# Patient Record
Sex: Male | Born: 1970
Health system: Southern US, Community
[De-identification: ages and names within clinical notes are randomized; demographics above are authoritative.]

## PROBLEM LIST (undated history)

## (undated) DIAGNOSIS — F32A Depression, unspecified: Secondary | ICD-10-CM

## (undated) DIAGNOSIS — G4733 Obstructive sleep apnea (adult) (pediatric): Secondary | ICD-10-CM

## (undated) DIAGNOSIS — F319 Bipolar disorder, unspecified: Secondary | ICD-10-CM

## (undated) DIAGNOSIS — I241 Dressler's syndrome: Secondary | ICD-10-CM

## (undated) DIAGNOSIS — J189 Pneumonia, unspecified organism: Secondary | ICD-10-CM

## (undated) DIAGNOSIS — I219 Acute myocardial infarction, unspecified: Secondary | ICD-10-CM

## (undated) DIAGNOSIS — R51 Headache: Secondary | ICD-10-CM

## (undated) DIAGNOSIS — I639 Cerebral infarction, unspecified: Secondary | ICD-10-CM

## (undated) DIAGNOSIS — F209 Schizophrenia, unspecified: Secondary | ICD-10-CM

## (undated) DIAGNOSIS — R0602 Shortness of breath: Secondary | ICD-10-CM

## (undated) DIAGNOSIS — R55 Syncope and collapse: Secondary | ICD-10-CM

## (undated) DIAGNOSIS — H269 Unspecified cataract: Secondary | ICD-10-CM

## (undated) DIAGNOSIS — G4730 Sleep apnea, unspecified: Secondary | ICD-10-CM

## (undated) DIAGNOSIS — Z860100 Personal history of colon polyps, unspecified: Secondary | ICD-10-CM

## (undated) DIAGNOSIS — F329 Major depressive disorder, single episode, unspecified: Secondary | ICD-10-CM

## (undated) DIAGNOSIS — A6002 Herpesviral infection of other male genital organs: Secondary | ICD-10-CM

## (undated) DIAGNOSIS — I493 Ventricular premature depolarization: Secondary | ICD-10-CM

## (undated) DIAGNOSIS — E785 Hyperlipidemia, unspecified: Secondary | ICD-10-CM

## (undated) DIAGNOSIS — I5022 Chronic systolic (congestive) heart failure: Secondary | ICD-10-CM

## (undated) DIAGNOSIS — I428 Other cardiomyopathies: Secondary | ICD-10-CM

## (undated) DIAGNOSIS — I2510 Atherosclerotic heart disease of native coronary artery without angina pectoris: Secondary | ICD-10-CM

## (undated) DIAGNOSIS — I10 Essential (primary) hypertension: Secondary | ICD-10-CM

## (undated) DIAGNOSIS — I272 Pulmonary hypertension, unspecified: Secondary | ICD-10-CM

## (undated) DIAGNOSIS — I509 Heart failure, unspecified: Secondary | ICD-10-CM

## (undated) DIAGNOSIS — E669 Obesity, unspecified: Secondary | ICD-10-CM

## (undated) HISTORY — DX: Essential (primary) hypertension: I10

## (undated) HISTORY — DX: Personal history of colon polyps, unspecified: Z86.0100

## (undated) HISTORY — PX: POLYPECTOMY: SHX149

## (undated) HISTORY — DX: Obesity, unspecified: E66.9

## (undated) HISTORY — DX: Ventricular premature depolarization: I49.3

## (undated) HISTORY — DX: Sleep apnea, unspecified: G47.30

## (undated) HISTORY — DX: Depression, unspecified: F32.A

## (undated) HISTORY — DX: Cerebral infarction, unspecified: I63.9

## (undated) HISTORY — DX: Unspecified cataract: H26.9

## (undated) HISTORY — DX: Obstructive sleep apnea (adult) (pediatric): G47.33

## (undated) HISTORY — DX: Dressler's syndrome: I24.1

## (undated) HISTORY — PX: COLONOSCOPY: SHX174

## (undated) HISTORY — DX: Syncope and collapse: R55

## (undated) HISTORY — DX: Shortness of breath: R06.02

## (undated) HISTORY — DX: Other cardiomyopathies: I42.8

## (undated) HISTORY — DX: Major depressive disorder, single episode, unspecified: F32.9

## (undated) HISTORY — DX: Hyperlipidemia, unspecified: E78.5

## (undated) HISTORY — PX: ORIF FINGER / THUMB FRACTURE: SUR932

## (undated) HISTORY — DX: Herpesviral infection of other male genital organs: A60.02

## (undated) HISTORY — PX: COLONOSCOPY W/ POLYPECTOMY: SHX1380

## (undated) HISTORY — DX: Personal history of colonic polyps: Z86.010

## (undated) HISTORY — DX: Heart failure, unspecified: I50.9

---
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
## 2002-06-04 ENCOUNTER — Encounter: Payer: Self-pay | Admitting: Pulmonary Disease

## 2002-10-23 ENCOUNTER — Encounter: Payer: Self-pay | Admitting: Pulmonary Disease

## 2003-08-01 DIAGNOSIS — I639 Cerebral infarction, unspecified: Secondary | ICD-10-CM

## 2003-08-01 HISTORY — DX: Cerebral infarction, unspecified: I63.9

## 2005-05-26 ENCOUNTER — Emergency Department (HOSPITAL_COMMUNITY): Admission: EM | Admit: 2005-05-26 | Discharge: 2005-05-26 | Payer: Self-pay | Admitting: Emergency Medicine

## 2005-08-19 ENCOUNTER — Emergency Department (HOSPITAL_COMMUNITY): Admission: EM | Admit: 2005-08-19 | Discharge: 2005-08-19 | Payer: Self-pay | Admitting: Family Medicine

## 2005-08-25 ENCOUNTER — Emergency Department (HOSPITAL_COMMUNITY): Admission: EM | Admit: 2005-08-25 | Discharge: 2005-08-26 | Payer: Self-pay | Admitting: Emergency Medicine

## 2005-09-10 ENCOUNTER — Emergency Department (HOSPITAL_COMMUNITY): Admission: EM | Admit: 2005-09-10 | Discharge: 2005-09-10 | Payer: Self-pay | Admitting: Emergency Medicine

## 2005-12-26 ENCOUNTER — Emergency Department (HOSPITAL_COMMUNITY): Admission: EM | Admit: 2005-12-26 | Discharge: 2005-12-26 | Payer: Self-pay | Admitting: Emergency Medicine

## 2006-01-10 ENCOUNTER — Emergency Department (HOSPITAL_COMMUNITY): Admission: EM | Admit: 2006-01-10 | Discharge: 2006-01-11 | Payer: Self-pay | Admitting: Anesthesiology

## 2006-03-22 ENCOUNTER — Emergency Department (HOSPITAL_COMMUNITY): Admission: EM | Admit: 2006-03-22 | Discharge: 2006-03-22 | Payer: Self-pay | Admitting: Family Medicine

## 2006-05-31 ENCOUNTER — Emergency Department (HOSPITAL_COMMUNITY): Admission: EM | Admit: 2006-05-31 | Discharge: 2006-05-31 | Payer: Self-pay | Admitting: Family Medicine

## 2006-10-22 ENCOUNTER — Emergency Department (HOSPITAL_COMMUNITY): Admission: EM | Admit: 2006-10-22 | Discharge: 2006-10-22 | Payer: Self-pay | Admitting: Family Medicine

## 2007-05-29 ENCOUNTER — Emergency Department (HOSPITAL_COMMUNITY): Admission: EM | Admit: 2007-05-29 | Discharge: 2007-05-29 | Payer: Self-pay | Admitting: Emergency Medicine

## 2007-06-03 ENCOUNTER — Emergency Department (HOSPITAL_COMMUNITY): Admission: EM | Admit: 2007-06-03 | Discharge: 2007-06-03 | Payer: Self-pay | Admitting: Emergency Medicine

## 2007-06-05 ENCOUNTER — Emergency Department (HOSPITAL_COMMUNITY): Admission: EM | Admit: 2007-06-05 | Discharge: 2007-06-05 | Payer: Self-pay | Admitting: Emergency Medicine

## 2007-06-08 ENCOUNTER — Inpatient Hospital Stay (HOSPITAL_COMMUNITY): Admission: EM | Admit: 2007-06-08 | Discharge: 2007-06-09 | Payer: Self-pay | Admitting: Emergency Medicine

## 2007-06-08 ENCOUNTER — Ambulatory Visit: Payer: Self-pay | Admitting: Internal Medicine

## 2007-06-09 ENCOUNTER — Encounter: Payer: Self-pay | Admitting: Internal Medicine

## 2007-06-11 ENCOUNTER — Ambulatory Visit: Payer: Self-pay | Admitting: Internal Medicine

## 2007-06-26 ENCOUNTER — Emergency Department (HOSPITAL_COMMUNITY): Admission: EM | Admit: 2007-06-26 | Discharge: 2007-06-27 | Payer: Self-pay | Admitting: Emergency Medicine

## 2007-08-15 ENCOUNTER — Emergency Department (HOSPITAL_COMMUNITY): Admission: EM | Admit: 2007-08-15 | Discharge: 2007-08-15 | Payer: Self-pay | Admitting: Emergency Medicine

## 2007-08-15 ENCOUNTER — Emergency Department (HOSPITAL_COMMUNITY): Admission: EM | Admit: 2007-08-15 | Discharge: 2007-08-15 | Payer: Self-pay | Admitting: Family Medicine

## 2007-09-19 ENCOUNTER — Emergency Department (HOSPITAL_COMMUNITY): Admission: EM | Admit: 2007-09-19 | Discharge: 2007-09-20 | Payer: Self-pay | Admitting: Family Medicine

## 2007-09-25 ENCOUNTER — Ambulatory Visit: Payer: Self-pay | Admitting: Internal Medicine

## 2007-10-07 ENCOUNTER — Ambulatory Visit: Payer: Self-pay

## 2007-10-07 ENCOUNTER — Encounter: Payer: Self-pay | Admitting: Internal Medicine

## 2007-10-10 DIAGNOSIS — I10 Essential (primary) hypertension: Secondary | ICD-10-CM | POA: Insufficient documentation

## 2007-10-10 DIAGNOSIS — E669 Obesity, unspecified: Secondary | ICD-10-CM

## 2007-10-10 DIAGNOSIS — Z860100 Personal history of colon polyps, unspecified: Secondary | ICD-10-CM | POA: Insufficient documentation

## 2007-10-10 DIAGNOSIS — E66813 Obesity, class 3: Secondary | ICD-10-CM | POA: Insufficient documentation

## 2007-10-10 DIAGNOSIS — E785 Hyperlipidemia, unspecified: Secondary | ICD-10-CM | POA: Insufficient documentation

## 2007-10-10 DIAGNOSIS — R55 Syncope and collapse: Secondary | ICD-10-CM | POA: Insufficient documentation

## 2007-10-10 DIAGNOSIS — E876 Hypokalemia: Secondary | ICD-10-CM | POA: Insufficient documentation

## 2007-10-10 DIAGNOSIS — I4949 Other premature depolarization: Secondary | ICD-10-CM

## 2007-10-10 DIAGNOSIS — E118 Type 2 diabetes mellitus with unspecified complications: Secondary | ICD-10-CM | POA: Insufficient documentation

## 2007-10-11 ENCOUNTER — Ambulatory Visit: Payer: Self-pay | Admitting: Pulmonary Disease

## 2007-10-11 DIAGNOSIS — G4733 Obstructive sleep apnea (adult) (pediatric): Secondary | ICD-10-CM | POA: Insufficient documentation

## 2007-11-04 ENCOUNTER — Ambulatory Visit: Payer: Self-pay | Admitting: Internal Medicine

## 2007-11-04 LAB — CONVERTED CEMR LAB
ALT: 25 units/L (ref 0–53)
AST: 21 units/L (ref 0–37)
Albumin: 3.7 g/dL (ref 3.5–5.2)
Alkaline Phosphatase: 78 units/L (ref 39–117)
BUN: 8 mg/dL (ref 6–23)
Bilirubin, Direct: 0.1 mg/dL (ref 0.0–0.3)
CO2: 30 meq/L (ref 19–32)
Calcium: 9 mg/dL (ref 8.4–10.5)
Chloride: 106 meq/L (ref 96–112)
Cholesterol: 256 mg/dL (ref 0–200)
Creatinine, Ser: 0.9 mg/dL (ref 0.4–1.5)
Direct LDL: 205.5 mg/dL
GFR calc Af Amer: 121 mL/min
GFR calc non Af Amer: 100 mL/min
Glucose, Bld: 369 mg/dL — ABNORMAL HIGH (ref 70–99)
HDL: 28.5 mg/dL — ABNORMAL LOW (ref >39.0)
Hgb A1c MFr Bld: 12.5 % — ABNORMAL HIGH (ref 4.6–6.0)
Potassium: 3.7 meq/L (ref 3.5–5.1)
Pro B Natriuretic peptide (BNP): 8 pg/mL (ref 0.0–100.0)
Sodium: 141 meq/L (ref 135–145)
Total Bilirubin: 0.8 mg/dL (ref 0.3–1.2)
Total CHOL/HDL Ratio: 9
Total Protein: 7.4 g/dL (ref 6.0–8.3)
Triglycerides: 230 mg/dL (ref 0–149)
VLDL: 46 mg/dL — ABNORMAL HIGH (ref 0–40)

## 2007-11-11 ENCOUNTER — Ambulatory Visit: Payer: Self-pay | Admitting: Pulmonary Disease

## 2007-11-13 ENCOUNTER — Ambulatory Visit: Payer: Self-pay | Admitting: *Deleted

## 2007-11-20 ENCOUNTER — Encounter: Payer: Self-pay | Admitting: Pulmonary Disease

## 2007-11-20 ENCOUNTER — Ambulatory Visit (HOSPITAL_BASED_OUTPATIENT_CLINIC_OR_DEPARTMENT_OTHER): Admission: RE | Admit: 2007-11-20 | Discharge: 2007-11-20 | Payer: Self-pay | Admitting: Pulmonary Disease

## 2007-12-03 ENCOUNTER — Ambulatory Visit: Payer: Self-pay | Admitting: Pulmonary Disease

## 2007-12-04 ENCOUNTER — Telehealth (INDEPENDENT_AMBULATORY_CARE_PROVIDER_SITE_OTHER): Payer: Self-pay | Admitting: *Deleted

## 2007-12-05 ENCOUNTER — Encounter (INDEPENDENT_AMBULATORY_CARE_PROVIDER_SITE_OTHER): Payer: Self-pay | Admitting: *Deleted

## 2008-01-20 ENCOUNTER — Emergency Department (HOSPITAL_COMMUNITY): Admission: EM | Admit: 2008-01-20 | Discharge: 2008-01-21 | Payer: Self-pay | Admitting: Emergency Medicine

## 2008-01-29 ENCOUNTER — Ambulatory Visit: Payer: Self-pay | Admitting: Internal Medicine

## 2008-04-22 ENCOUNTER — Emergency Department (HOSPITAL_COMMUNITY): Admission: EM | Admit: 2008-04-22 | Discharge: 2008-04-22 | Payer: Self-pay | Admitting: Emergency Medicine

## 2008-04-23 ENCOUNTER — Emergency Department (HOSPITAL_COMMUNITY): Admission: EM | Admit: 2008-04-23 | Discharge: 2008-04-24 | Payer: Self-pay | Admitting: Emergency Medicine

## 2008-04-24 ENCOUNTER — Emergency Department (HOSPITAL_BASED_OUTPATIENT_CLINIC_OR_DEPARTMENT_OTHER): Admission: EM | Admit: 2008-04-24 | Discharge: 2008-04-24 | Payer: Self-pay | Admitting: Emergency Medicine

## 2008-04-26 ENCOUNTER — Emergency Department (HOSPITAL_BASED_OUTPATIENT_CLINIC_OR_DEPARTMENT_OTHER): Admission: EM | Admit: 2008-04-26 | Discharge: 2008-04-26 | Payer: Self-pay | Admitting: Emergency Medicine

## 2008-05-08 ENCOUNTER — Ambulatory Visit: Payer: Self-pay | Admitting: Family Medicine

## 2008-05-08 LAB — CONVERTED CEMR LAB: Microalb, Ur: 1.59 mg/dL (ref 0.00–1.89)

## 2008-08-06 ENCOUNTER — Ambulatory Visit: Payer: Self-pay | Admitting: Family Medicine

## 2008-08-07 ENCOUNTER — Encounter (INDEPENDENT_AMBULATORY_CARE_PROVIDER_SITE_OTHER): Payer: Self-pay | Admitting: Family Medicine

## 2008-08-07 LAB — CONVERTED CEMR LAB
ALT: 28 units/L (ref 0–53)
AST: 20 units/L (ref 0–37)
Albumin: 4.4 g/dL (ref 3.5–5.2)
Alkaline Phosphatase: 96 units/L (ref 39–117)
BUN: 8 mg/dL (ref 6–23)
Basophils Absolute: 0 10*3/uL (ref 0.0–0.1)
Basophils Relative: 0 % (ref 0–1)
CO2: 23 meq/L (ref 19–32)
Calcium: 9.1 mg/dL (ref 8.4–10.5)
Chloride: 100 meq/L (ref 96–112)
Cholesterol: 298 mg/dL — ABNORMAL HIGH (ref 0–200)
Creatinine, Ser: 0.89 mg/dL (ref 0.40–1.50)
Eosinophils Absolute: 0.1 10*3/uL (ref 0.0–0.7)
Eosinophils Relative: 1 % (ref 0–5)
Free T4: 0.97 ng/dL (ref 0.89–1.80)
Glucose, Bld: 401 mg/dL — ABNORMAL HIGH (ref 70–99)
HCT: 41.9 % (ref 39.0–52.0)
HDL: 37 mg/dL — ABNORMAL LOW (ref >39)
Hemoglobin: 13.7 g/dL (ref 13.0–17.0)
LDL Cholesterol: 200 mg/dL — ABNORMAL HIGH (ref 0–99)
Lymphocytes Relative: 16 % (ref 12–46)
Lymphs Abs: 1.6 10*3/uL (ref 0.7–4.0)
MCHC: 32.7 g/dL (ref 30.0–36.0)
MCV: 90.3 fL (ref 78.0–100.0)
Monocytes Absolute: 0.4 10*3/uL (ref 0.1–1.0)
Monocytes Relative: 4 % (ref 3–12)
Neutro Abs: 7.5 10*3/uL (ref 1.7–7.7)
Neutrophils Relative %: 78 % — ABNORMAL HIGH (ref 43–77)
Platelets: 177 10*3/uL (ref 150–400)
Potassium: 4.2 meq/L (ref 3.5–5.3)
RBC: 4.64 M/uL (ref 4.22–5.81)
RDW: 13.3 % (ref 11.5–15.5)
Sodium: 140 meq/L (ref 135–145)
TSH: 0.564 microintl units/mL (ref 0.350–4.50)
Total Bilirubin: 0.3 mg/dL (ref 0.3–1.2)
Total CHOL/HDL Ratio: 8.1
Total Protein: 7.7 g/dL (ref 6.0–8.3)
Triglycerides: 304 mg/dL — ABNORMAL HIGH (ref <150)
VLDL: 61 mg/dL — ABNORMAL HIGH (ref 0–40)
WBC: 9.6 10*3/uL (ref 4.0–10.5)

## 2008-08-19 ENCOUNTER — Ambulatory Visit: Payer: Self-pay | Admitting: Internal Medicine

## 2008-11-25 ENCOUNTER — Emergency Department (HOSPITAL_BASED_OUTPATIENT_CLINIC_OR_DEPARTMENT_OTHER): Admission: EM | Admit: 2008-11-25 | Discharge: 2008-11-25 | Payer: Self-pay | Admitting: Emergency Medicine

## 2009-01-14 ENCOUNTER — Ambulatory Visit: Payer: Self-pay | Admitting: Family Medicine

## 2009-02-24 ENCOUNTER — Ambulatory Visit: Payer: Self-pay | Admitting: Family Medicine

## 2009-03-14 ENCOUNTER — Emergency Department (HOSPITAL_COMMUNITY): Admission: EM | Admit: 2009-03-14 | Discharge: 2009-03-14 | Payer: Self-pay | Admitting: Emergency Medicine

## 2009-03-23 ENCOUNTER — Ambulatory Visit: Payer: Self-pay | Admitting: Family Medicine

## 2009-05-28 ENCOUNTER — Emergency Department (HOSPITAL_COMMUNITY): Admission: EM | Admit: 2009-05-28 | Discharge: 2009-05-28 | Payer: Self-pay | Admitting: Family Medicine

## 2009-07-15 ENCOUNTER — Ambulatory Visit: Payer: Self-pay | Admitting: Family Medicine

## 2009-08-16 ENCOUNTER — Ambulatory Visit: Payer: Self-pay | Admitting: Family Medicine

## 2009-08-16 LAB — CONVERTED CEMR LAB
ALT: 19 units/L (ref 0–53)
AST: 16 units/L (ref 0–37)
Albumin: 4.1 g/dL (ref 3.5–5.2)
Alkaline Phosphatase: 86 units/L (ref 39–117)
BUN: 8 mg/dL (ref 6–23)
CO2: 23 meq/L (ref 19–32)
Calcium: 8.9 mg/dL (ref 8.4–10.5)
Chloride: 102 meq/L (ref 96–112)
Cholesterol: 283 mg/dL — ABNORMAL HIGH (ref 0–200)
Creatinine, Ser: 0.71 mg/dL (ref 0.40–1.50)
Glucose, Bld: 307 mg/dL — ABNORMAL HIGH (ref 70–99)
HDL: 38 mg/dL — ABNORMAL LOW (ref >39)
LDL Cholesterol: 209 mg/dL — ABNORMAL HIGH (ref 0–99)
Potassium: 3.9 meq/L (ref 3.5–5.3)
Sodium: 138 meq/L (ref 135–145)
Total Bilirubin: 0.4 mg/dL (ref 0.3–1.2)
Total CHOL/HDL Ratio: 7.4
Total Protein: 6.9 g/dL (ref 6.0–8.3)
Triglycerides: 182 mg/dL — ABNORMAL HIGH (ref <150)
VLDL: 36 mg/dL (ref 0–40)
Vit D, 25-Hydroxy: 11 ng/mL — ABNORMAL LOW (ref 30–89)

## 2009-10-05 ENCOUNTER — Ambulatory Visit: Payer: Self-pay | Admitting: Family Medicine

## 2009-11-04 ENCOUNTER — Ambulatory Visit: Payer: Self-pay | Admitting: Internal Medicine

## 2009-12-06 ENCOUNTER — Encounter (INDEPENDENT_AMBULATORY_CARE_PROVIDER_SITE_OTHER): Payer: Self-pay | Admitting: Family Medicine

## 2009-12-06 ENCOUNTER — Ambulatory Visit: Payer: Self-pay | Admitting: Internal Medicine

## 2009-12-06 LAB — CONVERTED CEMR LAB
BUN: 10 mg/dL (ref 6–23)
CO2: 29 meq/L (ref 19–32)
Calcium: 9.5 mg/dL (ref 8.4–10.5)
Chloride: 97 meq/L (ref 96–112)
Creatinine, Ser: 0.95 mg/dL (ref 0.40–1.50)
Glucose, Bld: 418 mg/dL — ABNORMAL HIGH (ref 70–99)
Potassium: 4.2 meq/L (ref 3.5–5.3)
Sodium: 138 meq/L (ref 135–145)
TSH: 0.74 microintl units/mL (ref 0.350–4.500)

## 2010-01-06 ENCOUNTER — Ambulatory Visit: Payer: Self-pay | Admitting: Family Medicine

## 2010-06-16 ENCOUNTER — Encounter (INDEPENDENT_AMBULATORY_CARE_PROVIDER_SITE_OTHER): Payer: Self-pay | Admitting: Family Medicine

## 2010-06-16 LAB — CONVERTED CEMR LAB: Microalb, Ur: 2.4 mg/dL — ABNORMAL HIGH (ref 0.00–1.89)

## 2010-08-11 ENCOUNTER — Emergency Department (HOSPITAL_COMMUNITY)
Admission: EM | Admit: 2010-08-11 | Discharge: 2010-08-12 | Payer: Self-pay | Source: Home / Self Care | Admitting: Emergency Medicine

## 2010-08-15 LAB — COMPREHENSIVE METABOLIC PANEL
ALT: 44 U/L (ref 0–53)
AST: 33 U/L (ref 0–37)
Albumin: 3.8 g/dL (ref 3.5–5.2)
Alkaline Phosphatase: 86 U/L (ref 39–117)
BUN: 7 mg/dL (ref 6–23)
CO2: 27 mEq/L (ref 19–32)
Calcium: 9.1 mg/dL (ref 8.4–10.5)
Chloride: 101 mEq/L (ref 96–112)
Creatinine, Ser: 0.88 mg/dL (ref 0.4–1.5)
GFR calc Af Amer: >60 mL/min (ref >60)
GFR calc non Af Amer: >60 mL/min (ref >60)
Glucose, Bld: 386 mg/dL — ABNORMAL HIGH (ref 70–99)
Potassium: 4.1 mEq/L (ref 3.5–5.1)
Sodium: 138 mEq/L (ref 135–145)
Total Bilirubin: 0.5 mg/dL (ref 0.3–1.2)
Total Protein: 7 g/dL (ref 6.0–8.3)

## 2010-08-15 LAB — CBC
HCT: 40 % (ref 39.0–52.0)
Hemoglobin: 13.6 g/dL (ref 13.0–17.0)
MCH: 29.8 pg (ref 26.0–34.0)
MCHC: 34 g/dL (ref 30.0–36.0)
MCV: 87.5 fL (ref 78.0–100.0)
Platelets: 171 10*3/uL (ref 150–400)
RBC: 4.57 MIL/uL (ref 4.22–5.81)
RDW: 12.2 % (ref 11.5–15.5)
WBC: 10.9 10*3/uL — ABNORMAL HIGH (ref 4.0–10.5)

## 2010-08-15 LAB — DIFFERENTIAL
Basophils Absolute: 0 10*3/uL (ref 0.0–0.1)
Basophils Relative: 0 % (ref 0–1)
Eosinophils Absolute: 0.1 10*3/uL (ref 0.0–0.7)
Eosinophils Relative: 1 % (ref 0–5)
Lymphocytes Relative: 22 % (ref 12–46)
Lymphs Abs: 2.4 10*3/uL (ref 0.7–4.0)
Monocytes Absolute: 0.8 10*3/uL (ref 0.1–1.0)
Monocytes Relative: 7 % (ref 3–12)
Neutro Abs: 7.6 10*3/uL (ref 1.7–7.7)
Neutrophils Relative %: 70 % (ref 43–77)

## 2010-08-15 LAB — LIPASE, BLOOD: Lipase: 25 U/L (ref 11–59)

## 2010-11-05 LAB — CBC
MCHC: 33.8 g/dL (ref 30.0–36.0)
Platelets: 144 10*3/uL — ABNORMAL LOW (ref 150–400)
RDW: 12.6 % (ref 11.5–15.5)

## 2010-11-05 LAB — DIFFERENTIAL
Basophils Absolute: 0.1 10*3/uL (ref 0.0–0.1)
Basophils Relative: 1 % (ref 0–1)
Neutro Abs: 8.9 10*3/uL — ABNORMAL HIGH (ref 1.7–7.7)
Neutrophils Relative %: 79 % — ABNORMAL HIGH (ref 43–77)

## 2010-11-05 LAB — POCT CARDIAC MARKERS
CKMB, poc: <1 ng/mL — ABNORMAL LOW (ref 1.0–8.0)
Myoglobin, poc: 65 ng/mL (ref 12–200)

## 2010-11-05 LAB — BASIC METABOLIC PANEL
CO2: 27 mEq/L (ref 19–32)
Calcium: 8.4 mg/dL (ref 8.4–10.5)
Creatinine, Ser: 0.74 mg/dL (ref 0.4–1.5)
Glucose, Bld: 250 mg/dL — ABNORMAL HIGH (ref 70–99)

## 2010-11-07 ENCOUNTER — Inpatient Hospital Stay (HOSPITAL_COMMUNITY)
Admission: EM | Admit: 2010-11-07 | Discharge: 2010-11-08 | DRG: 195 | Disposition: A | Discharge status: Home / Self Care | Payer: Self-pay | Attending: Internal Medicine | Admitting: Internal Medicine

## 2010-11-07 ENCOUNTER — Ambulatory Visit (INDEPENDENT_AMBULATORY_CARE_PROVIDER_SITE_OTHER): Payer: Self-pay

## 2010-11-07 ENCOUNTER — Inpatient Hospital Stay (INDEPENDENT_AMBULATORY_CARE_PROVIDER_SITE_OTHER)
Admission: RE | Admit: 2010-11-07 | Discharge: 2010-11-07 | Disposition: A | Discharge status: Home / Self Care | Payer: Self-pay | Source: Ambulatory Visit | Attending: Family Medicine | Admitting: Family Medicine

## 2010-11-07 DIAGNOSIS — Z8673 Personal history of transient ischemic attack (TIA), and cerebral infarction without residual deficits: Secondary | ICD-10-CM

## 2010-11-07 DIAGNOSIS — I10 Essential (primary) hypertension: Secondary | ICD-10-CM | POA: Diagnosis present

## 2010-11-07 DIAGNOSIS — J189 Pneumonia, unspecified organism: Secondary | ICD-10-CM

## 2010-11-07 DIAGNOSIS — F329 Major depressive disorder, single episode, unspecified: Secondary | ICD-10-CM | POA: Diagnosis present

## 2010-11-07 DIAGNOSIS — E785 Hyperlipidemia, unspecified: Secondary | ICD-10-CM | POA: Diagnosis present

## 2010-11-07 DIAGNOSIS — Z8619 Personal history of other infectious and parasitic diseases: Secondary | ICD-10-CM

## 2010-11-07 DIAGNOSIS — F3289 Other specified depressive episodes: Secondary | ICD-10-CM | POA: Diagnosis present

## 2010-11-07 DIAGNOSIS — D72829 Elevated white blood cell count, unspecified: Secondary | ICD-10-CM | POA: Diagnosis present

## 2010-11-07 DIAGNOSIS — IMO0001 Reserved for inherently not codable concepts without codable children: Secondary | ICD-10-CM | POA: Diagnosis present

## 2010-11-07 DIAGNOSIS — I517 Cardiomegaly: Secondary | ICD-10-CM

## 2010-11-07 DIAGNOSIS — I252 Old myocardial infarction: Secondary | ICD-10-CM

## 2010-11-07 DIAGNOSIS — G4730 Sleep apnea, unspecified: Secondary | ICD-10-CM | POA: Diagnosis present

## 2010-11-07 LAB — POCT I-STAT, CHEM 8
Calcium, Ion: 1.12 mmol/L (ref 1.12–1.32)
Chloride: 106 mEq/L (ref 96–112)
Glucose, Bld: 332 mg/dL — ABNORMAL HIGH (ref 70–99)
HCT: 42 % (ref 39.0–52.0)
Hemoglobin: 14.3 g/dL (ref 13.0–17.0)

## 2010-11-07 LAB — BASIC METABOLIC PANEL
BUN: 6 mg/dL (ref 6–23)
Calcium: 9 mg/dL (ref 8.4–10.5)
Creatinine, Ser: 0.77 mg/dL (ref 0.4–1.5)
GFR calc non Af Amer: >60 mL/min (ref >60)
Glucose, Bld: 263 mg/dL — ABNORMAL HIGH (ref 70–99)
Sodium: 140 mEq/L (ref 135–145)

## 2010-11-07 LAB — DIFFERENTIAL
Basophils Absolute: 0 10*3/uL (ref 0.0–0.1)
Eosinophils Relative: 1 % (ref 0–5)
Lymphocytes Relative: 19 % (ref 12–46)
Monocytes Absolute: 0.8 10*3/uL (ref 0.1–1.0)

## 2010-11-07 LAB — CBC
HCT: 40.3 % (ref 39.0–52.0)
MCHC: 34 g/dL (ref 30.0–36.0)
RDW: 12.5 % (ref 11.5–15.5)

## 2010-11-08 LAB — CBC
HCT: 37.4 % — ABNORMAL LOW (ref 39.0–52.0)
Hemoglobin: 12.8 g/dL — ABNORMAL LOW (ref 13.0–17.0)
WBC: 13.7 10*3/uL — ABNORMAL HIGH (ref 4.0–10.5)

## 2010-11-08 LAB — DIFFERENTIAL
Basophils Absolute: 0 10*3/uL (ref 0.0–0.1)
Lymphocytes Relative: 14 % (ref 12–46)
Neutro Abs: 10.4 10*3/uL — ABNORMAL HIGH (ref 1.7–7.7)
Neutrophils Relative %: 76 % (ref 43–77)

## 2010-11-08 LAB — GLUCOSE, CAPILLARY
Glucose-Capillary: 253 mg/dL — ABNORMAL HIGH (ref 70–99)
Glucose-Capillary: 321 mg/dL — ABNORMAL HIGH (ref 70–99)

## 2010-11-08 LAB — BASIC METABOLIC PANEL
CO2: 29 mEq/L (ref 19–32)
GFR calc Af Amer: >60 mL/min (ref >60)
GFR calc non Af Amer: >60 mL/min (ref >60)
Glucose, Bld: 246 mg/dL — ABNORMAL HIGH (ref 70–99)
Potassium: 3.2 mEq/L — ABNORMAL LOW (ref 3.5–5.1)
Sodium: 140 mEq/L (ref 135–145)

## 2010-11-08 LAB — CARDIAC PANEL(CRET KIN+CKTOT+MB+TROPI)
CK, MB: 2.3 ng/mL (ref 0.3–4.0)
Total CK: 337 U/L — ABNORMAL HIGH (ref 7–232)
Troponin I: 0.02 ng/mL (ref 0.00–0.06)
Troponin I: 0.02 ng/mL (ref 0.00–0.06)

## 2010-11-08 LAB — LIPID PANEL
HDL: 40 mg/dL (ref >39)
LDL Cholesterol: 173 mg/dL — ABNORMAL HIGH (ref 0–99)
Triglycerides: 95 mg/dL (ref <150)
VLDL: 19 mg/dL (ref 0–40)

## 2010-11-08 LAB — TROPONIN I: Troponin I: 0.04 ng/mL (ref 0.00–0.06)

## 2010-11-08 LAB — HEMOGLOBIN A1C: Mean Plasma Glucose: 298 mg/dL — ABNORMAL HIGH (ref <117)

## 2010-11-10 NOTE — Discharge Summary (Signed)
Mitchell [name], Mitchell [name] NO.:  [id number]  MEDICAL RECORD NO.:  [id number]           PATIENT TYPE:  I  LOCATION:  4735                         FACILITY:  [facility]  PHYSICIAN:  Andreas Blower, MD       DATE OF BIRTH:  05/03/1971  DATE OF ADMISSION:  11/07/2010 DATE OF DISCHARGE:  11/08/2010                              DISCHARGE SUMMARY   PRIMARY CARE PHYSICIAN:  [facility], Dr. Andrey Campanile.  DISCHARGE DIAGNOSES: 1. Community-acquired pneumonia. 2. Leukocytosis. 3. Uncontrolled diabetes. 4. Hypertension. 5. History of sleep apnea. 6. History of cerebrovascular accident without any residual weakness. 7. History of depression. 8. History of genital herpes. 9. Hyperlipidemia. 10.History of myocardial infarction.  DISCHARGE MEDICATIONS: 1. Azithromycin 250 mg p.o. daily for four more days. 2. Cefuroxime 500 mg p.o. twice daily for 7 days. 3. Glipizide 5 mg p.o. daily. 4. Lisinopril 10 mg p.o. daily. 5. Metformin 850 mg p.o. twice daily with meals. 6. Metoprolol 25 mg p.o. twice daily. 7. Seroquel 300 mg daily at bedtime. 8. Sertraline 200 mg p.o. q.a.m. 9. Simvastatin 20 mg p.o. nightly.  BRIEF ADMITTING HISTORY AND PHYSICAL:  Mitchell [name] is a 40 year old African American gentleman who presented with progressive shortness of breath and cough.  RADIOLOGY/IMAGING:  The patient had chest x-ray two-view which showed enlargement of the cardiac silhouette.  Right perihilar infiltrate compatible with pneumonia.  LABORATORY DATA:  CBC shows a white count of 13.7, hemoglobin 12.8, hematocrit 37.4, platelet count 176.  Electrolytes are normal except BUN is 3.2, hemoglobin A1c is 12.0 indicating average blood sugar of 298. Troponins negative x3.  LDL is 173.  BNP is 165.  HOSPITAL COURSE BY PROBLEM: 1. Community-acquired pneumonia.  The patient was started initially on     ceftriaxone and azithromycin.  During the course of the hospital     stay, his breathing improved.   He was transitioned to p.o.     cefuroxime and azithromycin, which he will continue cefuroxime for     seven more days and azithromycin for four more days to complete a     course of antibiotics. 2. Uncontrolled diabetes.  His hemoglobin A1c was markedly elevated at     12.0.  I had a long discussion with the patient, but being     compliant with medications and insulin.  Diabetic nurse also     spoke with the patient and instructed him. He does not want     insulin at this time, as a result, we will discharge the patient     on glipizide.  The patient will need close long-term follow for     his diabetes.  I instructed the patient, to have the patient stay     here for one more day; however, he declined and stating that he     have to go to [place] to attend graduation. 3. Hypertension, not well controlled.  The patient will be resume back     on lisinopril.  He will also be on metoprolol. 4. Leukocytosis most likely secondary from community-acquired     pneumonia. 5. History of sleep apnea, will  need to use his CPAP as an outpatient     on a regular basis.  Again, I instructed the patient given his multiple medical concerns which he has been ignoring for a while that the patient would benefit from staying here for one more day; however, the patient declined and wanted to be discharged to attend graduation.  I have prescribed medications for the patient to last for 1 month at least until he attends [facility] appointment schedules on November 17, 2010 at 8:45 a.m. I instructed the patient that [facility] should consider getting a 2-D echocardiogram for evaluation of cardiomegaly.  His last echocardiogram was in 2009.  DISPOSITION AND FOLLOWUP:  The patient to be follow up with Dr. Andrey Campanile with [facility] on November 17, 2010 at 8:45 a.m.  Time spent on discharge talking to the patient and coordinating care was 35 minutes.   Andreas Blower, MD   SR/MEDQ  D:  11/08/2010  T:   11/09/2010  Job:  [id number]  cc:   Dr. Andrey Campanile  Electronically Signed by Wardell Heath Estie Sproule  on 11/10/2010 11:33:56 AM

## 2010-11-10 NOTE — H&P (Signed)
Mitchell [name], KISIEL               ACCOUNT NO.:  [id number]  MEDICAL RECORD NO.:  [id number]           PATIENT TYPE:  E  LOCATION:  [facility]                         FACILITY:  [facility]  PHYSICIAN:  Homero Fellers, MD   DATE OF BIRTH:  Mar 19, 1971  DATE OF ADMISSION:  11/07/2010 DATE OF DISCHARGE:                             HISTORY & PHYSICAL   PRIMARY CARE PHYSICIAN:  Maurice March, MD  CHIEF COMPLAINT:  Cough and shortness of breath.  HISTORY OF PRESENT ILLNESS:  A 40 year old gentleman who presented with progressive shortness of breath and cough for the past 3-4 days.  His cough is mostly nonproductive.  He has vague chest discomfort with the cough.  There is no definite fever.  No nausea, vomiting, or diaphoresis.  The patient was seen in the emergency room, on evaluation showed a right perihilar infiltrate.  He also has leukocytosis.  He has multiple comorbidities and even though young, we have been asked to admit for inpatient management of pneumonia.  The patient has no history of COPD, but he does have history of sleep apnea for which he takes CPAP machine.  PAST MEDICAL HISTORY:  Coronary artery disease, high blood pressure, diabetes mellitus, history of CVA with no residual weakness, depression, genital herpes simplex, hyperlipidemia.  Also previous myocardial infarction.  MEDICATIONS:  Lisinopril, metformin, Seroquel, and Zoloft, these are the ones he probably remember, doses unavailable.  ALLERGY:  NSAIDS.  SOCIAL HISTORY:  No smoking, alcohol, or drugs.  FAMILY HISTORY:  Positive for diabetes mellitus in many siblings and father.  Mother also had history of CHF, diabetes, and cardiomyopathy, two uncles died of complication from diabetes.  A 10-point review of system is negative except as above.  PHYSICAL EXAMINATION:  VITAL SIGNS:  Blood pressure is 177/121, pulse 118, respirations 26, temp is 98.8, O2 sats 98%. GENERAL:  The patient is lying in bed,  appeared comfortable in no distress. HEENT:  Pallor.  Extraocular movements are intact. NECK:  Supple.  No JVD, adenopathy, or thyromegaly. LUNGS:  Clear to auscultation bilaterally except for at the bases, he had few crackles. HEART:  S1 and S2.  Regular rate and rhythm.  No murmurs, rubs, or gallops. ABDOMEN:  Full, soft, nontender.  Bowel sounds present.  No masses. EXTREMITIES:  No edema, clubbing, or cyanosis. NEUROLOGIC:  No focal deficits.  LABORATORY DATA:  White count 13.7, no left shift, hemoglobin 13.7, platelet count is 183.  Chemistry, potassium 3.6, sodium 140, BUN 6, creatinine 0.77, glucose 263.  Chest x-ray showed right perihilar infiltrate suggestive of pneumonia.  EKG is pending.  ASSESSMENT:  A 40 year old man with multiple medical problems, admitted with right perihilar infiltrate and respiratory symptoms suggestive of: 1. Community-acquired pneumonia. 2. Leukocytosis. 3. Uncontrolled diabetes mellitus. 4. Accelerated high blood pressure. 5. History of sleep apnea.  PLAN:  Admit to telemetry as an inpatient to Team 5, start ceftriaxone, azithromycin, and Mucinex.  Optimize blood pressure control with hydralazine, lisinopril/HCT, and IV labetalol.  Put him on insulin sliding scale, continue metformin at 850 mg b.i.d., pharmacy told to update home medication.  I will also check hemoglobin  A1c, fasting repeat blood cultures.  Urinalysis condition is stable.     Homero Fellers, MD     FA/MEDQ  D:  11/08/2010  T:  11/08/2010  Job:  [id number]  Electronically Signed by Homero Fellers  on 11/10/2010 09:24:08 AM

## 2010-11-14 LAB — CULTURE, BLOOD (ROUTINE X 2)
Culture  Setup Time: 201204100838
Culture: NO GROWTH

## 2010-11-26 ENCOUNTER — Inpatient Hospital Stay (HOSPITAL_COMMUNITY)
Admission: EM | Admit: 2010-11-26 | Discharge: 2010-11-29 | DRG: 194 | Disposition: A | Discharge status: Home / Self Care | Payer: Self-pay | Attending: Internal Medicine | Admitting: Internal Medicine

## 2010-11-26 ENCOUNTER — Encounter: Payer: Self-pay | Admitting: Internal Medicine

## 2010-11-26 ENCOUNTER — Emergency Department (HOSPITAL_COMMUNITY): Payer: Self-pay

## 2010-11-26 ENCOUNTER — Emergency Department (HOSPITAL_COMMUNITY)
Admission: EM | Admit: 2010-11-26 | Discharge: 2010-11-26 | Disposition: A | Discharge status: Home / Self Care | Payer: Self-pay | Attending: Emergency Medicine | Admitting: Emergency Medicine

## 2010-11-26 DIAGNOSIS — I10 Essential (primary) hypertension: Secondary | ICD-10-CM | POA: Diagnosis present

## 2010-11-26 DIAGNOSIS — E119 Type 2 diabetes mellitus without complications: Secondary | ICD-10-CM | POA: Insufficient documentation

## 2010-11-26 DIAGNOSIS — J189 Pneumonia, unspecified organism: Principal | ICD-10-CM | POA: Diagnosis present

## 2010-11-26 DIAGNOSIS — Z8673 Personal history of transient ischemic attack (TIA), and cerebral infarction without residual deficits: Secondary | ICD-10-CM | POA: Insufficient documentation

## 2010-11-26 DIAGNOSIS — R05 Cough: Secondary | ICD-10-CM | POA: Insufficient documentation

## 2010-11-26 DIAGNOSIS — Z9119 Patient's noncompliance with other medical treatment and regimen: Secondary | ICD-10-CM

## 2010-11-26 DIAGNOSIS — I252 Old myocardial infarction: Secondary | ICD-10-CM

## 2010-11-26 DIAGNOSIS — R059 Cough, unspecified: Secondary | ICD-10-CM | POA: Insufficient documentation

## 2010-11-26 DIAGNOSIS — I2510 Atherosclerotic heart disease of native coronary artery without angina pectoris: Secondary | ICD-10-CM | POA: Insufficient documentation

## 2010-11-26 DIAGNOSIS — E785 Hyperlipidemia, unspecified: Secondary | ICD-10-CM | POA: Diagnosis present

## 2010-11-26 DIAGNOSIS — R0609 Other forms of dyspnea: Secondary | ICD-10-CM | POA: Insufficient documentation

## 2010-11-26 DIAGNOSIS — Z79899 Other long term (current) drug therapy: Secondary | ICD-10-CM | POA: Insufficient documentation

## 2010-11-26 DIAGNOSIS — R079 Chest pain, unspecified: Secondary | ICD-10-CM | POA: Insufficient documentation

## 2010-11-26 DIAGNOSIS — F329 Major depressive disorder, single episode, unspecified: Secondary | ICD-10-CM | POA: Diagnosis present

## 2010-11-26 DIAGNOSIS — E669 Obesity, unspecified: Secondary | ICD-10-CM | POA: Diagnosis present

## 2010-11-26 DIAGNOSIS — R0602 Shortness of breath: Secondary | ICD-10-CM | POA: Insufficient documentation

## 2010-11-26 DIAGNOSIS — Z6841 Body Mass Index (BMI) 40.0 and over, adult: Secondary | ICD-10-CM

## 2010-11-26 DIAGNOSIS — G4733 Obstructive sleep apnea (adult) (pediatric): Secondary | ICD-10-CM | POA: Diagnosis present

## 2010-11-26 DIAGNOSIS — Z23 Encounter for immunization: Secondary | ICD-10-CM

## 2010-11-26 DIAGNOSIS — F3289 Other specified depressive episodes: Secondary | ICD-10-CM | POA: Diagnosis present

## 2010-11-26 DIAGNOSIS — E7800 Pure hypercholesterolemia, unspecified: Secondary | ICD-10-CM | POA: Insufficient documentation

## 2010-11-26 DIAGNOSIS — Z91199 Patient's noncompliance with other medical treatment and regimen due to unspecified reason: Secondary | ICD-10-CM

## 2010-11-26 DIAGNOSIS — R0989 Other specified symptoms and signs involving the circulatory and respiratory systems: Secondary | ICD-10-CM | POA: Insufficient documentation

## 2010-11-26 LAB — BASIC METABOLIC PANEL
Chloride: 107 mEq/L (ref 96–112)
Creatinine, Ser: 0.68 mg/dL (ref 0.4–1.5)
GFR calc Af Amer: >60 mL/min (ref >60)
GFR calc non Af Amer: >60 mL/min (ref >60)

## 2010-11-26 LAB — DIFFERENTIAL
Basophils Absolute: 0 10*3/uL (ref 0.0–0.1)
Basophils Relative: 0 % (ref 0–1)
Eosinophils Absolute: 0.1 10*3/uL (ref 0.0–0.7)
Eosinophils Absolute: 0.1 10*3/uL (ref 0.0–0.7)
Eosinophils Relative: 2 % (ref 0–5)
Eosinophils Relative: 0 % (ref 0–5)
Lymphs Abs: 1.5 10*3/uL (ref 0.7–4.0)
Monocytes Absolute: 0.6 10*3/uL (ref 0.1–1.0)
Monocytes Absolute: 0.9 10*3/uL (ref 0.1–1.0)
Monocytes Relative: 7 % (ref 3–12)

## 2010-11-26 LAB — GLUCOSE, CAPILLARY

## 2010-11-26 LAB — POCT CARDIAC MARKERS
CKMB, poc: 1.1 ng/mL (ref 1.0–8.0)
Troponin i, poc: <0.05 ng/mL (ref 0.00–0.09)
Troponin i, poc: <0.05 ng/mL (ref 0.00–0.09)

## 2010-11-26 LAB — CBC
HCT: 38.2 % — ABNORMAL LOW (ref 39.0–52.0)
MCH: 29.7 pg (ref 26.0–34.0)
MCHC: 34 g/dL (ref 30.0–36.0)
MCHC: 33.9 g/dL (ref 30.0–36.0)
MCV: 87.4 fL (ref 78.0–100.0)
Platelets: 158 10*3/uL (ref 150–400)
Platelets: 148 10*3/uL — ABNORMAL LOW (ref 150–400)
RDW: 12.3 % (ref 11.5–15.5)
RDW: 12.4 % (ref 11.5–15.5)
WBC: 8.1 10*3/uL (ref 4.0–10.5)
WBC: 16.8 10*3/uL — ABNORMAL HIGH (ref 4.0–10.5)

## 2010-11-26 LAB — BRAIN NATRIURETIC PEPTIDE: Pro B Natriuretic peptide (BNP): 184 pg/mL — ABNORMAL HIGH (ref 0.0–100.0)
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
## 2010-11-27 ENCOUNTER — Inpatient Hospital Stay (HOSPITAL_COMMUNITY): Payer: Self-pay

## 2010-11-27 LAB — COMPREHENSIVE METABOLIC PANEL
AST: 38 U/L — ABNORMAL HIGH (ref 0–37)
Albumin: 3.3 g/dL — ABNORMAL LOW (ref 3.5–5.2)
BUN: 8 mg/dL (ref 6–23)
Calcium: 8.2 mg/dL — ABNORMAL LOW (ref 8.4–10.5)
Creatinine, Ser: 0.71 mg/dL (ref 0.4–1.5)
GFR calc Af Amer: >60 mL/min (ref >60)
GFR calc non Af Amer: >60 mL/min (ref >60)
Total Bilirubin: 1.1 mg/dL (ref 0.3–1.2)

## 2010-11-27 LAB — GLUCOSE, CAPILLARY
Glucose-Capillary: 337 mg/dL — ABNORMAL HIGH (ref 70–99)
Glucose-Capillary: 256 mg/dL — ABNORMAL HIGH (ref 70–99)

## 2010-11-27 LAB — CARDIAC PANEL(CRET KIN+CKTOT+MB+TROPI)
Relative Index: 0.5 (ref 0.0–2.5)
Troponin I: 0.02 ng/mL (ref 0.00–0.06)

## 2010-11-27 LAB — HIV ANTIBODY (ROUTINE TESTING W REFLEX): HIV: NONREACTIVE

## 2010-11-28 LAB — GLUCOSE, CAPILLARY
Glucose-Capillary: 306 mg/dL — ABNORMAL HIGH (ref 70–99)
Glucose-Capillary: 276 mg/dL — ABNORMAL HIGH (ref 70–99)
Glucose-Capillary: 212 mg/dL — ABNORMAL HIGH (ref 70–99)

## 2010-11-28 LAB — BASIC METABOLIC PANEL
Chloride: 104 mEq/L (ref 96–112)
GFR calc Af Amer: >60 mL/min (ref >60)
GFR calc non Af Amer: >60 mL/min (ref >60)
Potassium: 3.7 mEq/L (ref 3.5–5.1)
Sodium: 137 mEq/L (ref 135–145)

## 2010-11-28 LAB — CBC
HCT: 34.7 % — ABNORMAL LOW (ref 39.0–52.0)
Hemoglobin: 11.6 g/dL — ABNORMAL LOW (ref 13.0–17.0)
MCV: 87.8 fL (ref 78.0–100.0)
RDW: 12.6 % (ref 11.5–15.5)
WBC: 12.1 10*3/uL — ABNORMAL HIGH (ref 4.0–10.5)

## 2010-11-29 ENCOUNTER — Encounter: Payer: Self-pay | Admitting: Internal Medicine

## 2010-11-29 DIAGNOSIS — J189 Pneumonia, unspecified organism: Secondary | ICD-10-CM

## 2010-11-29 LAB — GLUCOSE, CAPILLARY: Glucose-Capillary: 239 mg/dL — ABNORMAL HIGH (ref 70–99)

## 2010-11-29 LAB — CBC
MCV: 87.9 fL (ref 78.0–100.0)
Platelets: 156 10*3/uL (ref 150–400)
RBC: 3.81 MIL/uL — ABNORMAL LOW (ref 4.22–5.81)
RDW: 12.6 % (ref 11.5–15.5)
WBC: 8.8 10*3/uL (ref 4.0–10.5)

## 2010-11-29 LAB — VANCOMYCIN, TROUGH: Vancomycin Tr: 9.8 ug/mL — ABNORMAL LOW (ref 10.0–20.0)

## 2010-11-29 NOTE — Progress Notes (Signed)
Brief discharge note:  Mitchell [name] was admitted 4/28 for:  -  HAP (although this is more likely poorly treated CAP) and cough. He was started on IV Vanc + Zosyn for which he received a 4 day course. He was discharged home on po Clindamycin (pna thought to be likely 2/2 aspiration pna) and doxycycline, for which he is to complete a 7 day course. Will need to ensure that patient is taking these meds as prescribed when he follows up in the clinic.  - Poorly controlled DM - AIC of 12. At home, he was taking Metformin and Glipizide. Med noncompliance is an issue for this patient and this is occasionally due to financial difficulty. He was started on Novolog Mix 10 units BID in addition to his oral antiglycemics. On follow-up, will need to refer patient to [name] for diabetes education, so he can be properly informed on how to dose his insulin, check and record his blood sugars appropriately. Further insulin dose adjustment may be needed depending on his cbgs.  - Financial difficulty - patient has been unable to pay for his meds, and missed an appointment to renew his orange card at [facility] earlier in April. However, during this hospitalization, he was offered a 3 day supply of his antibiotics but patient stated that he will be able to pay for them on his own. Regardless, he will need to be referred to [name] so he can get an orange card via our clinic as [facility] will not be able to see him anytime soon.

## 2010-12-04 ENCOUNTER — Encounter: Payer: Self-pay | Admitting: Internal Medicine

## 2010-12-06 ENCOUNTER — Encounter: Payer: Self-pay | Admitting: Internal Medicine

## 2010-12-13 NOTE — Consult Note (Signed)
Mitchell [name], Mitchell [name]               ACCOUNT NO.:  [id number]   MEDICAL RECORD NO.:  [id number]          PATIENT TYPE:  EMS   LOCATION:  [facility]                         FACILITY:  [facility]   PHYSICIAN:  Pramod P. Pearlean Brownie, MD    DATE OF BIRTH:  January 26, 1971   DATE OF CONSULTATION:  06/05/2007  DATE OF DISCHARGE:  06/05/2007                                 CONSULTATION   REFERRING PHYSICIAN:  Doug Sou, M.D.   REASON FOR REFERRAL:  Code stroke.   HISTORY OF PRESENT ILLNESS:  Mr. Malen Gauze is a 40 year old, African-  American gentleman who was brought in by EMS with a diagnosis of code  stroke.  However, code stroke was subsequently cancelled as the patient  stated that he felt dizzy from this morning onwards.  He, however,  developed sudden onset of left-sided weakness in [place] today when he  was in a cell, and he was asked to give a urine sample.  He walked to  the restroom and apparently fell and bumped his forehead.  There was no  apparent loss of consciousness, generalized tonic-clonic activity.  Since then, he is not moving his left side.  He has known history of  diabetes and has been off medications for last several weeks as he  cannot afford them.  He has been seen in the ER a couple of times in the  last few weeks with significantly elevated sugars.  He claims his sugar  was more than 500 yesterday.  He was recently asked to see a  psychiatrist during his last ER visit and has a pending outpatient  appointment.  He also admits to hearing voices and seeing things which  are not real.   PAST MEDICAL HISTORY:  Significant for diabetes and hypertension.  Myocardial infarction.   SOCIAL HISTORY:  He lives with his wife.  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  He does not smoke, drink or do drugs.   FAMILY HISTORY:  Noncontributory.   PHYSICAL EXAMINATION:  Reveals an obese, young, African-American  gentleman not in distress, afebrile.  Pulse rate is 100 per  minute and  regular, respiratory rate 20 per minute, blood pressure 162/82,  temperature 98.3.  Saturation 100% on room air.  HEAD:  Is nontraumatic.  There is a small subcutaneous swelling in the  forehead in the midline which may be from recent fall.  It is tender to  touch.  NECK:  Supple.  No bruits.  CARDIAC EXAM:  Regular heart sounds.  No murmur or gallop.  LUNGS:  Clear to auscultation.  ABDOMEN:  Soft, nontender.  NEUROLOGIC EXAM:  The patient is awake, alert.  He is oriented to time,  place and person.  There is no aphasia, apraxia or dysarthria.  Pupils  equal and reactive.  Eye movements are full range.  There is no  nystagmus.  Face is symmetric.  Palatal movements normal.  Tongue is  midline.  Motor system exam reveals subjective left-sided weakness, but  when the patient is distracted, he is able to maintain tone against  gravity on  the left side.  He has variable effort on testing his left  side.  He has good strength on the right.  Deep tendon reflexes are 1+,  symmetric.  Plantars are downgoing.  There is no objective sensory loss.  Gait was not tested.   DATA REVIEW:  Noncontrast CAT scan of the head done today reveals no  acute abnormality.  Admission labs are pending at this time.  Cardiac  enzymes are negative.  Hemoglobin, hematocrit and electrolytes are  normal.   IMPRESSION:  A 40 year old gentleman with dizziness and sudden onset of  left hemiparesis following a fall today.  His exam has no organic  features, and his left hemiparesis is not consistent with a stroke and  more likely due to conversion reaction.   PLAN:  I had long discussion with the patient regarding symptoms.  I  would recommend ongoing medical therapy for his uncontrolled diabetes as  well as psych evaluation and follow-up for his underlying psych  condition.  No further stroke workup is indicated at this time.  Kindly  call for questions.           ______________________________   Sunny Schlein. Pearlean Brownie, MD     PPS/MEDQ  D:  06/05/2007  T:  06/06/2007  Job:  [id number]
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
## 2010-12-13 NOTE — Assessment & Plan Note (Signed)
[facility]                            CARDIOLOGY OFFICE NOTE   Mitchell [name], Mitchell [name]                      MRN:          [id number]  DATE:01/29/2008                            DOB:          12/13/1970    PRIMARY CARE SERVICE:  [facility].   INTERVAL HISTORY:  Mitchell [name] is a 40 year old male with history of  obesity, hypertension, diabetes, obstructive sleep apnea, and recurrent  syncope thought to be secondary to vasovagal episodes.  He also has  frequent PVCs.  He returns today for routine followup.   Echocardiogram showed an EF of 50%-55% with mild mitral regurgitation  and mild diastolic dysfunction.  Myoview showed an EF of 42% with no  ischemia or scar.   He has been diagnosed with depression and started on Seroquel.  He says  this has made him very very fatigued to the point where sometimes he  cannot even stand up.  From the cardiac point of view, however, he is  doing quite well.  No chest pain or shortness of breath.  No lower  extremity edema.  Has not passed out or had any palpitations.   CURRENT MEDICATIONS:  1. Metformin 500 b.i.d.  2. Lisinopril 20 a day.  3. Clonidine 0.2 b.i.d.  4. Pravachol 40 a day.  5. Glyburide 5 a day.  6. Seroquel 200 mg at night.  7. Zoloft 100 in the morning and 50 at noon.  8. CPAP.  9. Coreg 6.25 b.i.d.   PHYSICAL EXAMINATION:  GENERAL:  He is fatigued, appearing in no acute  distress.  Ambulates around the clinic slowly without any respiratory  difficulty.  VITAL SIGNS:  Blood pressure is 142/92, heart rate is 98, and weight is  294.  HEENT:  Normal.  NECK:  Supple.  There is no JVD.  Carotid are 2+ bilaterally without  bruits.  There is no lymphadenopathy or thyromegaly.  CARDIAC:  His PMI is nondisplaced.  Regular rate and rhythm.  No S4 no  murmur.  LUNGS:  Clear.  ABDOMEN:  Obese, nontender, and nondistended.  No hepatosplenomegaly, no  bruits, no masses.  Good bowel sounds.  EXTREMITIES:  Warm with no cyanosis, clubbing, or edema.  SKIN:  No rash.  NEURO:  Alert and oriented x3.  Cranial nerves II through XII are  intact.  Moves all four extremities without difficulty.  Affect is  flattened.   EKG shows normal sinus rhythm with mildly flattened T-waves laterally  with a QTC of 467 milliseconds.   ASSESSMENT/PLAN:  Hypertension.  Blood pressure remained elevated.  We  will go ahead and increase his Coreg to 12.5 b.i.d.  I did have a long  discussion with him about the possibility that some of his fatigue may  be due to his blood pressure medications, but he states clearly that  this is related to his Seroquel.  I have told him that if he stops his  Seroquel and is still having problems with fatigue, then we can change  his regimen; we can consider changing clonidine over to Norvasc and  plus/minus spironolactone to see if  that helps his blood pressure  without so much fatigue.  He will get back to [place].     Mitchell Buckles. Bensimhon, MD  Electronically Signed    DRB/MedQ  DD: 01/29/2008  DT: 01/30/2008  Job #: [id number]   cc:   Dala Dock

## 2010-12-13 NOTE — Assessment & Plan Note (Signed)
[facility]                            CARDIOLOGY OFFICE NOTE   Mitchell [name], Mitchell [name]                      MRN:          [id number]  DATE:11/04/2007                            DOB:          Jan 26, 1971    PRIMARY CARE PHYSICIAN:  Will be [facility].   INTERVAL HISTORY:  Mitchell [name] is a 40 year old male with a history of  obesity, hypertension, diabetes and obstructive sleep apnea and  recurrent syncope, thought secondary to vasovagal episodes.  He also has  frequent PVCs.  He returns today for routine followup.   His last visit, we started him on clonidine for his hypertension,  referred him for a stress test and echo and also sent him to pulmonary  for a sleep study.  He was found to have severe obstructive sleep apnea,  started on CPAP which he has been compliant with.  He says his breathing  is much better now, and he feels much more rested.  He denies any chest  pain or shortness of breath.  He had a echocardiogram which showed an EF  of 50% to 55%.  With normal wall thickness, there was mild mitral  regurgitation.  There is a mild diastolic dysfunction.  Myoview showed  ejection fraction of 42%, with no evidence of ischemia or scar.  There  were PVCs during the test.   Otherwise, he says he is doing well.  He has not had any heart failure  symptoms.  He does complain of erectile dysfunction and is quite worried  about that.   CURRENT MEDICATIONS:  1. Metformin 500 b.i.d.  2. Lisinopril 20 a day.  3. Clonidine 0.1 b.i.d.  4. Pravachol 40 a day.  5. Glyburide 5 a day.  6. Seroquel 200 mg at night.  7. Zoloft 100 in the morning and 50 at noon and CPAP.   PHYSICAL EXAM:  GENERAL:  He is in no acute distress.  He ambulates on  the clinic without any respiratory difficulty.  VITAL SIGNS:  Blood pressure is 146/100, heart rate is 85.  Weight is  294.  HEENT:  Normal.  NECK:  Supple and thick.  JVP appears flat.  Carotid are 2+ bilateral  without bruits.  There is no lymphadenopathy or thyromegaly.  CARDIAC:  PMI is nondisplaced.  He is regular with an S4.  There is occasional  ectopy.  No murmur.  LUNGS:  Clear.  ABDOMEN:  Obese, nontender, nondistended, no hepatosplenomegaly, no  bruits, no masses appreciated.  EXTREMITIES:  Warm with no cyanosis, clubbing or edema.  No rash.  NEURO:  Alert and x3.  Cranial nerves II-XII intact.  Moves all 4  without extremities difficulty.  Affect is pleasant.   ASSESSMENT/PLAN:  1. Hypertension.  Blood pressure is elevated.  We will start him on      Coreg 6.25 mg b.i.d.  2. Mild LV dysfunction.  I suspect this is related to his hypertension      and/or sleep apnea.  However, there is a chance this could be      related to his frequent PVCs.  I  suspect this is non-ischemic.  We      will start him on Coreg and titrate his ACE inhibitor and beta      blocker.  This is not getting better.  He will probably need to      wear a Holter monitor, to gauge the volume of his PVCs, to see if      this is sufficient.  We also need to consider coronary artery      disease, but I think this is less likely given his Myoview.  We      will continue to follow.  3. Lipids.  He is due for repeat check.  Continue Pravachol.  4. Diabetes.  This will be followed by [facility].  Will check his      hemoglobin A1c for him today.  5. Erectile dysfunction.  We have given him a prescription for Viagra,      had a long talk with him about the interaction with nitrates, and      he does not have access to these and is aware he should not use      them together.   DISPOSITION:  Will see him back in 3 months.     [name]. Bensimhon, MD  Electronically Signed    DRB/MedQ  DD: 11/04/2007  DT: 11/04/2007  Job #: [id number]   cc:   Dala Dock

## 2010-12-13 NOTE — Assessment & Plan Note (Signed)
[facility]                            CARDIOLOGY OFFICE NOTE   [name], [name]                      MRN:          [id number]  DATE:09/25/2007                            DOB:          1970-11-16    PRIMARY CARE PHYSICIAN:  [facility] (will be).   INTERVAL HISTORY:  [name] is a complicated 40 year old male with  history of obesity, hypertension, diabetes, PVCs and previous CVA and  reported myocardial infarction at age 58 in [place].   I first saw him in the hospital November 2008 when he had recurrent  syncope.  Workup at that time was essentially negative.  He did have  PVCs, occasional bigeminy. Bedside echo which showed ejection fraction  60% with no other obvious abnormalities.  He comes today for routine  followup.  He is a difficult historian.  He said the other day he went  to Urgent Care with severe nausea and vomiting and apparently had  another syncopal episode during that time.  He denies any chest pain or  palpitations with this.  He also says that he has a hard time breathing  at night.  He denies orthopnea, but just says he snores a lot and his  wife is often waiting for him to start breathing again and she often  sleeps in another room.   He also tells me that he recently was sent to mental health and  diagnosis depression and some other disorder that he cannot remember.  Finally, he says his 87 year old daughter is in the hospital in [place].  She is morbidly obese at the 250 pounds and apparently had syncope while  playing sports.  He could not tell me if she required defibrillation or  any other details.   CURRENT MEDICATIONS:  1. Seroquel 100 mg at night.  2. Metformin 500 b.i.d.  3. Zoloft 100 mg a day.  4. Lisinopril unknown dose.  5. Clonidine 0.1 b.i.d.  6. Pravachol 40 a day.  7. Glyburide 5 a day.   PHYSICAL EXAM:  Weight is 295, blood pressure 180/118, heart rate 84  (did not take his blood pressure  medications today).  He is in no acute distress.  He ambulates around the clinic without  respiratory difficulty.  HEENT: Is normal except for poor dentition and a left facial droop.  Neck is supple.  There is no JVD.  Carotid 2+ bilaterally without  bruits.  There is no lymphadenopathy or thyromegaly.  CARDIAC:  His PMI is nondisplaced.  He is regular with S4; no murmur.  LUNGS:  Clear.  ABDOMEN: Obese,  nontender, nondistended. No hepatosplenomegaly, no  bruits, no masses.  Good bowel sounds.  EXTREMITIES:  Warm with no cyanosis, clubbing or edema.  No rash.  NEURO:  Alert and oriented x3.  Cranial nerves II-XII are intact except  for his facial droop.  Moves all four extremities without difficulty.  Affect is pleasant..   EKG shows a sinus rhythm with frequent polymorphic PVCs, heart rate is  88, QT interval is normal.  No ST-T wave abnormalities.   ASSESSMENT/PLAN:  1. Recurrent  syncope. This last issue sounds quite vasovagal in      nature.  However, I am a bit concerned about what happened to his      daughter and whether there might be a familial history of sudden      cardiac death.  I think what we need to do now is more clearly risk      stratify him.  Will get a formal echocardiogram to make sure he      does not have a hypertrophic myopathy, also given a Myoview to rule      out underlying ischemia.  2. Hypertension.  Blood pressure is markedly elevated.  However, this      is in the setting of medication noncompliance. I suspect even on      his current medications that he would not be well-controlled.  We      will increase his clonidine 0.2 b.i.d.  3. Probable obstructive sleep apnea. Referred him to the Pulmonary      Clinic for further evaluation.   DISPOSITION:  He will follow back up with [place] in a month to recheck his  blood pressure and review his testing.  He may need EP evaluation in the  future.  I would very be very interested in seeing the records from his   daughters hospitalization to more clearly find out what happened to her.     [name]. Bensimhon, MD  Electronically Signed    DRB/MedQ  DD: 09/25/2007  DT: 09/26/2007  Job #: [id number]

## 2010-12-13 NOTE — Procedures (Signed)
Mitchell [name], Mitchell [name] NO.:  [id number]   MEDICAL RECORD NO.:  [id number]          PATIENT TYPE:  OUT   LOCATION:  SLEEP CENTER                 [facility]   PHYSICIAN:  Barbaraann Share, MD,FCCPDATE OF BIRTH:  10/[birth year]/68   DATE OF STUDY:  11/20/2007                            NOCTURNAL POLYSOMNOGRAM   REFERRING PHYSICIAN:  Barbaraann Share, MD,FCCP   INDICATION FOR STUDY:  Hypersomnia with sleep apnea.  The patient  returns for CPAP titration.   EPWORTH SLEEPINESS SCORE:  Is 19.   MEDICATIONS:   SLEEP ARCHITECTURE:  The patient had a total sleep time of 405 minutes  with no slow wave sleep and only 36 minutes of REM.  Sleep onset latency  was normal and REM onset was normal as well.  Sleep efficiency was  excellent at 94%.   RESPIRATORY DATA:  The patient underwent CPAP titration with a large  ResMed Quattro full face mask.  The patient's pressure was initiated at  5 cm of H2O and ultimately increased to a level of 8 cm of H2O pressure  by the end of the study.  There was excellent control on the final CPAP  pressure; however, the patient never achieved supine REM on the final  setting.   OXYGEN DATA:  There was O2 desaturation as low as 80% prior to reaching  optimal CPAP.   CARDIAC DATA:  The patient was found to have fairly frequent PVCs, but  no clinically significant arrhythmia.   MOVEMENT/PARASOMNIA:  There were no leg jerks or abnormal behaviors  noted.   IMPRESSIONS/RECOMMENDATIONS:  1. Good control of previously-documented obstructive sleep apnea with      a CPAP pressure of 8 cm of H2O, delivered by a large ResMed Quattro      full face mask.  It should be noted, however, that the patient      achieved no supine REM on the final CPAP setting.  Initiation of      CPAP at 8 cm is recommended, as well as weight loss.  2. Fairly frequent premature ventricular contractions throughout but      no clinically significant arrhythmias were  noted.      Barbaraann Share, MD,FCCP  Diplomate, American Board of Sleep  Medicine  Electronically Signed     [facility]/MEDQ  D:  12/03/2007 15:56:50  T:  12/03/2007 16:09:57  Job:  [id number]
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
## 2010-12-14 ENCOUNTER — Inpatient Hospital Stay (HOSPITAL_COMMUNITY)
Admission: EM | Admit: 2010-12-14 | Discharge: 2010-12-20 | DRG: 286 | Disposition: A | Discharge status: Home / Self Care | Payer: Self-pay | Attending: Internal Medicine | Admitting: Internal Medicine

## 2010-12-14 ENCOUNTER — Emergency Department (HOSPITAL_COMMUNITY): Payer: Self-pay

## 2010-12-14 DIAGNOSIS — E876 Hypokalemia: Secondary | ICD-10-CM | POA: Diagnosis present

## 2010-12-14 DIAGNOSIS — J9600 Acute respiratory failure, unspecified whether with hypoxia or hypercapnia: Secondary | ICD-10-CM | POA: Diagnosis present

## 2010-12-14 DIAGNOSIS — I509 Heart failure, unspecified: Secondary | ICD-10-CM | POA: Diagnosis present

## 2010-12-14 DIAGNOSIS — I10 Essential (primary) hypertension: Secondary | ICD-10-CM | POA: Diagnosis present

## 2010-12-14 DIAGNOSIS — Z9119 Patient's noncompliance with other medical treatment and regimen: Secondary | ICD-10-CM

## 2010-12-14 DIAGNOSIS — G4733 Obstructive sleep apnea (adult) (pediatric): Secondary | ICD-10-CM | POA: Diagnosis present

## 2010-12-14 DIAGNOSIS — E785 Hyperlipidemia, unspecified: Secondary | ICD-10-CM | POA: Diagnosis present

## 2010-12-14 DIAGNOSIS — F3289 Other specified depressive episodes: Secondary | ICD-10-CM | POA: Diagnosis present

## 2010-12-14 DIAGNOSIS — I2510 Atherosclerotic heart disease of native coronary artery without angina pectoris: Secondary | ICD-10-CM | POA: Diagnosis present

## 2010-12-14 DIAGNOSIS — Z8673 Personal history of transient ischemic attack (TIA), and cerebral infarction without residual deficits: Secondary | ICD-10-CM

## 2010-12-14 DIAGNOSIS — F329 Major depressive disorder, single episode, unspecified: Secondary | ICD-10-CM | POA: Diagnosis present

## 2010-12-14 DIAGNOSIS — Z794 Long term (current) use of insulin: Secondary | ICD-10-CM

## 2010-12-14 DIAGNOSIS — J189 Pneumonia, unspecified organism: Secondary | ICD-10-CM | POA: Diagnosis present

## 2010-12-14 DIAGNOSIS — I428 Other cardiomyopathies: Secondary | ICD-10-CM | POA: Diagnosis present

## 2010-12-14 DIAGNOSIS — IMO0001 Reserved for inherently not codable concepts without codable children: Secondary | ICD-10-CM | POA: Diagnosis present

## 2010-12-14 DIAGNOSIS — Z91199 Patient's noncompliance with other medical treatment and regimen due to unspecified reason: Secondary | ICD-10-CM

## 2010-12-14 DIAGNOSIS — Z79899 Other long term (current) drug therapy: Secondary | ICD-10-CM

## 2010-12-14 DIAGNOSIS — I5023 Acute on chronic systolic (congestive) heart failure: Principal | ICD-10-CM | POA: Diagnosis present

## 2010-12-14 LAB — DIFFERENTIAL
Basophils Absolute: 0 10*3/uL (ref 0.0–0.1)
Eosinophils Relative: 1 % (ref 0–5)
Lymphocytes Relative: 16 % (ref 12–46)
Lymphs Abs: 1.8 10*3/uL (ref 0.7–4.0)
Monocytes Absolute: 0.6 10*3/uL (ref 0.1–1.0)
Neutro Abs: 9 10*3/uL — ABNORMAL HIGH (ref 1.7–7.7)

## 2010-12-14 LAB — CBC
HCT: 37 % — ABNORMAL LOW (ref 39.0–52.0)
Hemoglobin: 12.8 g/dL — ABNORMAL LOW (ref 13.0–17.0)
MCHC: 34.6 g/dL (ref 30.0–36.0)
MCV: 86.7 fL (ref 78.0–100.0)

## 2010-12-14 LAB — POCT I-STAT 3, ART BLOOD GAS (G3+)
pCO2 arterial: 38 mmHg (ref 35.0–45.0)
pH, Arterial: 7.438 (ref 7.350–7.450)
pO2, Arterial: 65 mmHg — ABNORMAL LOW (ref 80.0–100.0)

## 2010-12-14 LAB — POCT CARDIAC MARKERS
CKMB, poc: 1.3 ng/mL (ref 1.0–8.0)
Myoglobin, poc: 59.8 ng/mL (ref 12–200)

## 2010-12-14 LAB — BASIC METABOLIC PANEL
CO2: 28 mEq/L (ref 19–32)
Calcium: 9 mg/dL (ref 8.4–10.5)
GFR calc Af Amer: >60 mL/min (ref >60)
GFR calc non Af Amer: >60 mL/min (ref >60)
Potassium: 3.4 mEq/L — ABNORMAL LOW (ref 3.5–5.1)
Sodium: 139 mEq/L (ref 135–145)

## 2010-12-15 ENCOUNTER — Inpatient Hospital Stay (HOSPITAL_COMMUNITY): Payer: Self-pay

## 2010-12-15 LAB — BLOOD GAS, ARTERIAL
Drawn by: 29925
Patient temperature: 98.6
pCO2 arterial: 40.2 mmHg (ref 35.0–45.0)
pH, Arterial: 7.441 (ref 7.350–7.450)

## 2010-12-15 LAB — COMPREHENSIVE METABOLIC PANEL
ALT: 17 U/L (ref 0–53)
AST: 14 U/L (ref 0–37)
CO2: 28 mEq/L (ref 19–32)
Calcium: 8.4 mg/dL (ref 8.4–10.5)
Chloride: 103 mEq/L (ref 96–112)
GFR calc Af Amer: >60 mL/min (ref >60)
GFR calc non Af Amer: >60 mL/min (ref >60)
Glucose, Bld: 287 mg/dL — ABNORMAL HIGH (ref 70–99)
Sodium: 140 mEq/L (ref 135–145)
Total Bilirubin: 0.4 mg/dL (ref 0.3–1.2)

## 2010-12-15 LAB — CBC
HCT: 34.4 % — ABNORMAL LOW (ref 39.0–52.0)
MCHC: 33.4 g/dL (ref 30.0–36.0)
MCV: 86.9 fL (ref 78.0–100.0)
Platelets: 214 10*3/uL (ref 150–400)
RDW: 12.7 % (ref 11.5–15.5)
WBC: 10.1 10*3/uL (ref 4.0–10.5)

## 2010-12-15 LAB — CK TOTAL AND CKMB (NOT AT ARMC)
CK, MB: 2.5 ng/mL (ref 0.3–4.0)
CK, MB: 1.9 ng/mL (ref 0.3–4.0)
CK, MB: 2.2 ng/mL (ref 0.3–4.0)
Relative Index: 1.5 (ref 0.0–2.5)
Total CK: 167 U/L (ref 7–232)
Total CK: 125 U/L (ref 7–232)
Total CK: 145 U/L (ref 7–232)

## 2010-12-15 LAB — GLUCOSE, CAPILLARY
Glucose-Capillary: 323 mg/dL — ABNORMAL HIGH (ref 70–99)
Glucose-Capillary: 175 mg/dL — ABNORMAL HIGH (ref 70–99)

## 2010-12-15 LAB — MRSA PCR SCREENING: MRSA by PCR: NEGATIVE

## 2010-12-15 LAB — LACTIC ACID, PLASMA: Lactic Acid, Venous: 1.9 mmol/L (ref 0.5–2.2)

## 2010-12-15 LAB — PHOSPHORUS: Phosphorus: 3.8 mg/dL (ref 2.3–4.6)

## 2010-12-15 LAB — TROPONIN I: Troponin I: <0.3 ng/mL (ref <0.30)

## 2010-12-15 LAB — D-DIMER, QUANTITATIVE: D-Dimer, Quant: 0.46 ug/mL-FEU (ref 0.00–0.48)

## 2010-12-16 DIAGNOSIS — I509 Heart failure, unspecified: Secondary | ICD-10-CM

## 2010-12-16 LAB — BASIC METABOLIC PANEL
CO2: 30 mEq/L (ref 19–32)
Calcium: 8.7 mg/dL (ref 8.4–10.5)
Creatinine, Ser: 0.65 mg/dL (ref 0.4–1.5)
GFR calc Af Amer: >60 mL/min (ref >60)
GFR calc non Af Amer: >60 mL/min (ref >60)
Sodium: 140 mEq/L (ref 135–145)

## 2010-12-16 LAB — CBC
Hemoglobin: 11.7 g/dL — ABNORMAL LOW (ref 13.0–17.0)
MCH: 29 pg (ref 26.0–34.0)
MCHC: 33.1 g/dL (ref 30.0–36.0)
RDW: 12.8 % (ref 11.5–15.5)

## 2010-12-16 LAB — DIFFERENTIAL
Basophils Absolute: 0 10*3/uL (ref 0.0–0.1)
Basophils Relative: 0 % (ref 0–1)
Eosinophils Absolute: 0.2 10*3/uL (ref 0.0–0.7)
Eosinophils Relative: 3 % (ref 0–5)
Monocytes Absolute: 0.6 10*3/uL (ref 0.1–1.0)
Monocytes Relative: 6 % (ref 3–12)
Neutro Abs: 6.2 10*3/uL (ref 1.7–7.7)

## 2010-12-17 ENCOUNTER — Inpatient Hospital Stay (HOSPITAL_COMMUNITY): Payer: Self-pay

## 2010-12-17 DIAGNOSIS — I5021 Acute systolic (congestive) heart failure: Secondary | ICD-10-CM

## 2010-12-17 LAB — IRON AND TIBC
Iron: 40 ug/dL — ABNORMAL LOW (ref 42–135)
TIBC: 255 ug/dL (ref 215–435)
UIBC: 215 ug/dL

## 2010-12-17 LAB — FOLATE: Folate: 9.1 ng/mL

## 2010-12-17 LAB — BASIC METABOLIC PANEL
BUN: 11 mg/dL (ref 6–23)
CO2: 31 mEq/L (ref 19–32)
GFR calc non Af Amer: >60 mL/min (ref >60)
Glucose, Bld: 298 mg/dL — ABNORMAL HIGH (ref 70–99)
Potassium: 3.6 mEq/L (ref 3.5–5.1)
Sodium: 140 mEq/L (ref 135–145)

## 2010-12-17 LAB — CBC
HCT: 37.6 % — ABNORMAL LOW (ref 39.0–52.0)
Hemoglobin: 12.5 g/dL — ABNORMAL LOW (ref 13.0–17.0)
MCHC: 33.2 g/dL (ref 30.0–36.0)
MCV: 88.1 fL (ref 78.0–100.0)
RDW: 12.8 % (ref 11.5–15.5)

## 2010-12-17 LAB — VITAMIN B12: Vitamin B-12: 325 pg/mL (ref 211–911)

## 2010-12-17 LAB — GLUCOSE, CAPILLARY: Glucose-Capillary: 226 mg/dL — ABNORMAL HIGH (ref 70–99)

## 2010-12-18 LAB — BASIC METABOLIC PANEL
BUN: 11 mg/dL (ref 6–23)
CO2: 31 mEq/L (ref 19–32)
Calcium: 9.3 mg/dL (ref 8.4–10.5)
Chloride: 99 mEq/L (ref 96–112)
Creatinine, Ser: 0.68 mg/dL (ref 0.4–1.5)
GFR calc Af Amer: >60 mL/min (ref >60)
Glucose, Bld: 189 mg/dL — ABNORMAL HIGH (ref 70–99)

## 2010-12-18 LAB — GLUCOSE, CAPILLARY
Glucose-Capillary: 190 mg/dL — ABNORMAL HIGH (ref 70–99)
Glucose-Capillary: 178 mg/dL — ABNORMAL HIGH (ref 70–99)
Glucose-Capillary: 155 mg/dL — ABNORMAL HIGH (ref 70–99)

## 2010-12-18 LAB — CBC
HCT: 39.7 % (ref 39.0–52.0)
Hemoglobin: 13.3 g/dL (ref 13.0–17.0)
MCH: 29.4 pg (ref 26.0–34.0)
MCHC: 33.5 g/dL (ref 30.0–36.0)
MCV: 87.8 fL (ref 78.0–100.0)
RBC: 4.52 MIL/uL (ref 4.22–5.81)

## 2010-12-18 LAB — IRON AND TIBC
Saturation Ratios: 17 % — ABNORMAL LOW (ref 20–55)
TIBC: 278 ug/dL (ref 215–435)
UIBC: 232 ug/dL

## 2010-12-18 LAB — FOLATE: Folate: 16.2 ng/mL

## 2010-12-18 LAB — VITAMIN B12: Vitamin B-12: 347 pg/mL (ref 211–911)

## 2010-12-18 LAB — FERRITIN: Ferritin: 166 ng/mL (ref 22–322)

## 2010-12-19 DIAGNOSIS — I2510 Atherosclerotic heart disease of native coronary artery without angina pectoris: Secondary | ICD-10-CM

## 2010-12-19 HISTORY — PX: CARDIAC CATHETERIZATION: SHX172

## 2010-12-19 LAB — BASIC METABOLIC PANEL
BUN: 13 mg/dL (ref 6–23)
Chloride: 100 mEq/L (ref 96–112)
Potassium: 3.7 mEq/L (ref 3.5–5.1)
Sodium: 139 mEq/L (ref 135–145)

## 2010-12-19 LAB — GLUCOSE, CAPILLARY
Glucose-Capillary: 327 mg/dL — ABNORMAL HIGH (ref 70–99)
Glucose-Capillary: 183 mg/dL — ABNORMAL HIGH (ref 70–99)
Glucose-Capillary: 219 mg/dL — ABNORMAL HIGH (ref 70–99)

## 2010-12-19 LAB — PROTIME-INR: Prothrombin Time: 14.1 seconds (ref 11.6–15.2)

## 2010-12-20 LAB — CBC
HCT: 41.2 % (ref 39.0–52.0)
Hemoglobin: 13.9 g/dL (ref 13.0–17.0)
MCV: 89 fL (ref 78.0–100.0)
RBC: 4.63 MIL/uL (ref 4.22–5.81)
WBC: 11.7 10*3/uL — ABNORMAL HIGH (ref 4.0–10.5)

## 2010-12-20 LAB — GLUCOSE, CAPILLARY: Glucose-Capillary: 176 mg/dL — ABNORMAL HIGH (ref 70–99)

## 2010-12-20 LAB — BASIC METABOLIC PANEL
BUN: 13 mg/dL (ref 6–23)
Chloride: 100 mEq/L (ref 96–112)
Glucose, Bld: 242 mg/dL — ABNORMAL HIGH (ref 70–99)
Potassium: 4.6 mEq/L (ref 3.5–5.1)
Sodium: 138 mEq/L (ref 135–145)
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
## 2010-12-20 NOTE — Cardiovascular Report (Signed)
Mitchell [name], Mitchell [name] NO.:  [id number]  MEDICAL RECORD NO.:  [id number]           PATIENT TYPE:  I  LOCATION:  3712                         FACILITY:  [facility]  PHYSICIAN:  Verne Carrow, MDDATE OF BIRTH:  [birth year]  DATE OF PROCEDURE:  12/19/2010 DATE OF DISCHARGE:                           CARDIAC CATHETERIZATION   PRIMARY CARDIOLOGIST:  Maisie Fus C. Wall, MD, [facility]  PROCEDURE PERFORMED: 1. Left heart catheterization. 2. Selective coronary angiography.  OPERATOR:  Verne Carrow, MD  ARTERIAL ACCESS SITE:  Right femoral artery.  The patient had a negative Allen test in the right radial artery.  INDICATIONS:  This is a 40 year old African American male with a history of diabetes mellitus and hypertension who was admitted to the hospital with shortness of breath and found to have an ejection fraction of 25% on echocardiogram.  The patient was felt to have a nonischemic cardiomyopathy.  However, a diagnostic catheterization was arranged today to exclude obstructive coronary artery disease in the major epicardial vessels.  PROCEDURE IN DETAIL:  The patient was brought to the main cardiac catheterization laboratory after signing informed consent for the procedure.  An Freida Busman test was performed on the right wrist and was negative.  Because of this, we decided to go through the right femoral artery for access.  The right groin was prepped and draped in sterile fashion.  Lidocaine 1% was used for local anesthesia.  A 5-French sheath was inserted into the right femoral artery without difficulty.  Standard diagnostic catheters were used to perform selective coronary angiography.  A pigtail catheter was used to cross the aortic valve into the left ventricle.  A pullback was done across the aortic valve.  No left ventricular angiogram was performed secondary to his elevated diastolic filling pressures in the left ventricle.  The patient was taken to the  recovery room in stable condition.  There were no immediate complications.  HEMODYNAMIC FINDINGS:  Central aortic pressure 104/74.  Left ventricular pressure 105/15/25.  ANGIOGRAPHIC FINDINGS: 1. The left main coronary artery had no evidence of disease. 2. The left anterior descending was a large vessel that coursed to the     apex and gave off two very small-caliber diagonal branches.  There     appeared to be 40% plaque throughout the mid LAD.  The vessel     became very small-caliber at the apex and had diffuse 60% disease.     The two very small-caliber diagonal branches had diffuse 70%     stenosis, the vessels were too small for percutaneous intervention. 3. The circumflex artery was a moderate to large size vessel that gave     off a large bifurcating first obtuse marginal branch.  The superior     branch had a 40% stenosis.  The inferior subbranch had a 60%     stenosis but was very small caliber.  The remainder of the AV     groove circumflex had mild plaque disease. 4. The right coronary artery was a moderate-sized dominant vessel with  mild plaque disease but no focally obstructive lesions. 5. The left ventricular angiogram was performed secondary  to elevated     left ventricular filling pressures.  IMPRESSION: 1. Diffuse nonobstructive coronary artery disease. 2. Nonischemic cardiomyopathy.  RECOMMENDATIONS:  At this point, I recommend medical management.  I would recommend continue his aspirin, statin, ACE inhibitor and starting a low-dose beta-blocker.  I would start Coreg 3.125 mg p.o. twice daily. The patient can be discharged from a cardiac standpoint after his bedrest.  Followup can be completed in [facility] with Dr. Valera Castle.     Verne Carrow, MD     CM/MEDQ  D:  12/19/2010  T:  12/20/2010  Job:  [id number]  cc:   Thomas C. Daleen Squibb, MD, [facility]  Electronically Signed by Verne Carrow MD on 12/20/2010 08:38:16 AM

## 2010-12-21 LAB — CULTURE, BLOOD (ROUTINE X 2): Culture: NO GROWTH

## 2010-12-26 NOTE — Discharge Summary (Signed)
Mitchell [name], Mitchell [name] NO.:  [id number]  MEDICAL RECORD NO.:  [id number]           PATIENT TYPE:  I  LOCATION:  3712                         FACILITY:  [facility]  PHYSICIAN:  Brendia Sacks, MD    DATE OF BIRTH:  06-20-1971  DATE OF ADMISSION:  12/14/2010 DATE OF DISCHARGE:  12/20/2010                              DISCHARGE SUMMARY   PRIMARY CARE PHYSICIAN:  [facility].  PRIMARY CARDIOLOGIST:  Jesse Sans. Wall, MD, FACC  CONDITION ON DISCHARGE:  Improved.  DISCHARGE DIAGNOSES: 1. Acute systolic congestive heart failure, new diagnosis. 2. Nonischemic cardiomyopathy, new diagnosis. 3. Diabetes mellitus type 2, uncontrolled by hemoglobin A1c, stable on     discharge. 4. Hypertension, stable. 5. Obstructive sleep apnea, noncompliant.  HISTORY OF PRESENT ILLNESS:  This is a 40 year old male who presented to the emergency room with shortness of breath.  He was admitted for further evaluation and treatment.  HOSPITAL COURSE: 1. New diagnosis of acute systolic congestive heart failure.  The     patient was admitted to the medical floor, diuresed with IV Lasix,     started on cardiac medications.  A 2-D echocardiogram was notable     for marked systolic dysfunction which was new.  The patient was     seen in consultation with Cardiology and left heart catheterization     was recommended to exclude ischemia.  This was performed on May 22     and was notable for diffuse nonobstructive coronary artery disease,     therefore nonischemic cardiomyopathy.  Aspirin, statin, ACE     inhibitor, beta-blocker therapy were recommended.  The patient is     feeling much better, ambulating without difficulty, and is now     stable for discharge.  Follow up with Dr. Daleen Squibb in the outpatient     setting. 2. Nonischemic cardiomyopathy.  Plan as above. 3. I have placed the patient on medications that he can obtain in [employer] for 4 dollars per month as previously compliance for  financial     reasons has been an issue. 4. Hypertension.  This is a extremely well controlled on his new     medications. 5. History of obstructive sleep apnea.  The patient has been     noncompliant with CPAP. 6. Diabetes mellitus type 2.  Now stable on insulin.  He will continue     on insulin and glipizide.  He assures me that he can fill this     prescription.  CONSULTATIONS:  Cardiology.  Recommendations as above.  PROCEDURES:  Left heart cardiac catheterization May 22:  Diffuse nonobstructive coronary artery disease.  Nonischemic cardiomyopathy.  IMAGING: 1. Chest x-ray May 16:  Low lung volumes with bilateral airspace     disease.  Pulmonary edema. 2. Chest x-ray May 17:  Improved aeration with improvement in airspace     disease.  Stable cardiomegaly. 3. Chest x-ray May 19:  Stable enlargement of the pericardial     silhouette with persistent vascular congestion, but improved patchy     airspace disease.  MICROBIOLOGY:  Blood cultures x2 May  16:  No growth to date.  ANCILLARY STUDIES:  Two-D echocardiogram May 18:  Left ventricular ejection fraction 20-25%.  Diffuse hypokinesis.  PERTINENT LABORATORY STUDIES: 1. TSH within normal limits. 2. D-dimer negative. 3. HIV antibody nonreactive. 4. Lipid panel from previous admission in April 2012 showed total     cholesterol of 232, LDL of 173, HDL 40, and triglycerides 95. 5. CBC unremarkable on discharge.  Minimal elevation of white blood     cell count, not likely significant. 6. Capillary blood sugar is better controlled over the last 48 hours     with increased insulin.  We will increase the 70/30 slightly to 28     units b.i.d. 7. Basic metabolic panel unremarkable on discharge.  EXAM ON DISCHARGE:  GENERAL:  The patient is feeling well.  He is ambulating.  He feels ready to go home. VITAL SIGNS:  Temperature is 98.2, pulse 101, respirations 20, blood pressure 91/63. CARDIOVASCULAR:  Regular rhythm, tachycardic.   No murmur, rub, or gallop.  No lower extremity edema. ABDOMEN:  Soft, nontender, nondistended. RESPIRATORY:  Clear to auscultation bilaterally.  No wheezes, rales, or rhonchi.  Normal respiratory effort.  DISCHARGE INSTRUCTIONS:  The patient will be discharged home today. Diet is a heart-healthy diabetic diet.  Activity is as tolerated.  Wound care per [facility].  Follow up with Dr. Daleen Squibb.  Office will call you for appointment.  Follow up with [facility] for eligibility June 7 at 10:45 a.m. and with Dr. Audria Nine at [facility] June 14 at 9:00 a.m.  DISCHARGE MEDICATIONS: 1. Aspirin 81 mg p.o. daily. 2. Lasix 40 mg p.o. daily. 3. Pravachol 40 mg p.o. nightly. 4. Potassium chloride 20 mEq 2 tablets p.o. daily. 5. Lopressor 25 mg p.o. b.i.d. 6. Insulin 70/30, 28 units subcutaneous b.i.d. before meals. 7. Glipizide 5 mg p.o. daily. 8. Lisinopril 10 mg p.o. daily. 9. Seroquel 100 mg 3 tablets p.o. nightly. 10.Ventolin inhaler 2 puffs every 4 hours as needed for shortness of     breath. 11.Zoloft 100 mg 2 tablets p.o. daily.  DISCONTINUE THE FOLLOWING MEDICATIONS: 1. Metformin. 2. Lipitor. 3. Coreg.  Time in coordinating discharge is 35 minutes.     Brendia Sacks, MD     DG/MEDQ  D:  12/20/2010  T:  12/21/2010  Job:  [id number]  cc:   [facility] Jesse Sans. Daleen Squibb, MD, [facility]  Electronically Signed by Brendia Sacks  on 12/26/2010 04:41:55 PM

## 2010-12-27 NOTE — Discharge Summary (Signed)
NAMEBRALIN, Mitchell [name]               ACCOUNT NO.:  [id number]  MEDICAL RECORD NO.:  [id number]           PATIENT TYPE:  I  LOCATION:  6710                         FACILITY:  [facility]  PHYSICIAN:  C. [name], M.D.DATE OF BIRTH:  15-Sep-1970  DATE OF ADMISSION:  11/26/2010 DATE OF DISCHARGE:  11/29/2010                              DISCHARGE SUMMARY   DISCHARGE DIAGNOSES: 1. Community-acquired pneumonia. 2. Type 2 diabetes (poorly controlled). 3. Obstructive sleep apnea. 4. Depression. 5. Hypertension. 6. Cardiomyopathy with left ventricular ejection fraction of 50%-55%     in 2009. 7. History of cerebrovascular accident without residual weakness. 8. History of cardiac injury from blunt trauma.  DISCHARGE MEDICATIONS: 1. Clindamycin 300 mg take 1 tablet by mouth every 6 hours for 7 days. 2. Doxycycline 100 mg take 1 tablet by mouth twice a day for 7 days. 3. Tussionex suspension, take 5 mL by mouth every 12 hours as needed     for cough. 4. Insulin NovoLog mix, inject 10 units under the skin twice daily     before breakfast and before dinner. 5. Coreg 12.5 mg take 1 tablet by mouth twice daily. 6. Glipizide 5 mg take 1 tablet by mouth every morning. 7. Lipitor 10 mg take 1 tablet by mouth every evening. 8. Lisinopril 10 mg take 1 tablet by mouth every morning. 9. Metformin 500 mg take 1 tablet by mouth twice daily before meals. 10.Metoprolol tartrate 25 mg take 1 tablet by mouth twice a day. 11.Seroquel 100 mg take 3 tablets by mouth daily at bedtime. 12.Ventolin inhaler take inhaled 2 puffs every 4 hours as needed for     shortness of breath. 13.Zoloft 100 mg tablets, take 2 tablets by mouth every morning.  DISPOSITION AND FOLLOWUP:  Mitchell [name] is to follow up with Dr. Lyn Hollingshead of the [facility] on Dec 06, 2010 at 10:45 a.m. At the follow-up appointment, please address the following issues: 1. Please ensure that the patient actually completed his 7-day  course     of antibiotics, which includes of clindamycin and doxycycline for     his pneumonia. 2. The patient has poorly controlled type 2 diabetes with A1c of 12.     He was initially taking oral anti-glycemics, however, medication     noncompliance has been the biggest issue for him due to financial     difficulty.  During his hospitalization, the patient was started on     insulin, NovoLog mix 10 units b.i.d. in addition to his oral anti-     glycemics.  Please ensure that the patient is using the insulin     correctly and he will need a referral to [name] for diabetes     education.  The patient is also unable to pay for his medications,     and at this time it would be appropriate for referral to [facility]     so that the patient can get an orange card.  PROCEDURES PERFORMED: 1. A 2 view chest x-ray performed on November 26, 2010 showed vague     patchy air space disease  bilaterally, which may represent edema,     although pneumonia cannot be excluded, cardiomegaly. 2. Portable chest x-ray obtained on November 26, 2010 showed worsening of     air space disease, particularly in the right mid and lower lung     field most consistent with pneumonia, cardiomegaly .  CONSULTATIONS:  None.  BRIEF ADMISSION HISTORY AND PHYSICAL:  Mitchell [name] is a 40 year old African American with a history of poorly controlled type 2 diabetes, brought to the ED by EMS on April 28 for severe shortness of breath and coughing.  The patient had a recent admission for community-acquired pneumonia and discharged on April 10 with azithromycin and cefuroxime. The patient reported he had been taking his medications, but he was accustom to making his meds stretch by taking them only every other day. He felt that he was still fairly sick and therefore was appointed to make the antibiotics last.  However, the patient continued to have significant coughing particularly at night, which keep him from sleeping and  shortness of breath that limited his activity during the day.  The patient also endorsed decreased appetite.  However, denied fevers, chills, abdominal pain, chest pain, although the patient said his chest did feel sore after long coughing spells.  He also denied any changes in bowel habits or sick contacts.  The patient came to the ED early in the day on April 28 with complaints of ongoing cough and shortness of breath and was discharged home on antibiotics.  However at home, he developed severe coughing spell and felt that he could not get any air, so the patient 911, but collapsed in the floor before they got there.  He denied losing any consciousness or threaten his head.  PHYSICAL EXAMINATION:  ADMISSION VITAL SIGNS:  Temperature 100.3, heart rate 113, respirations from 23-34, oxygen saturation 97%. GENERAL:  The patient was in no acute distress.  Alert and oriented x3. HEENT:  Head without observed lesions.  Eyes:  EOMI bilaterally.  PERRL bilaterally.  Nonicteric.  No conjunctival injection or pallor. Oropharynx had moist mucosal membrane.  No lesions. NECK:  Supple.  No JVD bilaterally.  No masses.LUNGS:  Breath sounds are shallow.  Chest wall, equal in expansion bilaterally.  Mild wheezes bilaterally with decreased breath sounds bibasilarly. CARDIOVASCULAR:  Tachycardic, but regular.  No murmurs, rubs, or gallops. ABDOMEN:  Nondistended.  No pulsatile masses.  Bowel sounds present, nontender to palpation.  No hepatosplenomegaly. EXTREMITIES:  Trace lower extremity edema.  No clubbing or cyanosis bilaterally. MUSCULOSKELETAL:  No joint erythema, effusion, or crepitus. SKIN:  No lesions or rashes.  Good turgor.  Normal hair pattern and distribution. NEURO:  Cranial nerves II through XII grossly intact.  Strength 5/5 in upper and lower extremities bilaterally. PSYCH:  Appropriate affect.  Memory intact to recent and remote events. Nondepressed and non-anxious  appearing.  INITIAL LABORATORY DATA:  CBC:  White count was 8.1, hemoglobin 13, hematocrit 38.2, platelets 158, ANC of 5.9.  Point-of-care cardiac markers were within normal limits.  BNP 184.  Sodium 136, potassium 3.6, chloride 107, bicarb 21, BUN 11, creatinine 0.68, blood glucose 264, calcium 8.8.  HOSPITAL COURSE BY PROBLEM: 1. Community-acquired pneumonia.  On admission, the patient was     initially started on IV vancomycin and Zosyn due to concern for     hospital-acquired pneumonia given that the patient had recently     been discharged from the hospital for the same problem.  However,     it is  very likely that this was a poorly treated community-acquired     pneumonia that the patient was representing with given that he was     not compliant with his antibiotics.  He did receive a 4-day course     of IV vancomycin and Zosyn after which he experienced significant     clinical improvement.  The patient was discharged home on p.o.     clindamycin (especially given the location of pneumonia, which is     more concerning for aspiration pneumonia and so anaerobic coverage     was desired) as well as doxycycline for which he is to complete a 7-     day course.  The patient did state that he will be able to afford    his medicine and declined social work assistance for funds to cover     a 3-day supply.  In addition, the patient's cough was treated     aggressively with cough suppressants, opiates, and bronchodilators.     The patient will follow up with Dr. Allena Katz where he is ensure that     the patient has actually completed his 7-day course of his     clindamycin and doxycycline. 2. Poorly controlled diabetes.  The patient's A1c was 12 in April.     Again, the patient was previously prescribed metformin and     glipizide, but was trying to make them stretch as discussed above.     Given the severity of his diabetes, the patient was started on     insulin in order to achieve a  better control.  He was discharged on     a regimen of 70/30 NovoLog mix and he also received diabetes     education during his hospitalization.  Again, diabetes management     needs to be aggressively addressed at his follow-up by Dr. Allena Katz on     May 8.  The patient will also benefit greatly from referral to     [name] for diabetes education. 3. Depression.  The patient was continued on his home medications for     this. 4. Hypertension.  The patient was continued on his home lisinopril and     metoprolol.  DISCHARGE VITAL SIGNS:  Temperature 99, pulse 84, respirations 20, blood pressure 148/79, oxygen saturation 94% on room air.  DISCHARGE LABS:  White blood cell 8.8, hemoglobin 11.2, hematocrit 33.5, platelets 156.  Sodium 137, potassium 3.7, chloride 104, bicarb 25, BUN 6, creatinine 0.72, glucose 257, HIV antibody was nonreactive. Hemoglobin A1c on November 08, 2010 noted to be 12.    ______________________________ Jaci Lazier, MD   ______________________________ C. [name], M.D.    NI/MEDQ  D:  12/04/2010  T:  12/05/2010  Job:  [id number]  cc:   Lyn Hollingshead, MD  Electronically Signed by Jaci Lazier MD on 12/15/2010 08:18:02 AM Electronically Signed by Eliezer Lofts M.D. on 12/27/2010 02:19:40 PM

## 2010-12-29 NOTE — H&P (Signed)
NAME:  Mitchell [name], Mitchell [name]               ACCOUNT NO.:  [id number]  MEDICAL RECORD NO.:  [id number]           PATIENT TYPE:  E  LOCATION:  [facility]                         FACILITY:  [facility]  PHYSICIAN:  Lonia Blood, M.D.      DATE OF BIRTH:  05-16-1971  DATE OF ADMISSION:  12/14/2010 DATE OF DISCHARGE:                             HISTORY & PHYSICAL   PRIMARY CARE PHYSICIAN:  Unassigned  PRESENTING COMPLAINT:  Shortness of breath, numbness on left side as well as back pain.  HISTORY OF PRESENT ILLNESS:  The patient is a 40 year old gentleman who was recently discharged from [facility] on Nov 29, 2010.  He was supposed to follow up at the clinic with Dr. Allena Katz, one of the residents there, but did not make it.  At that time, he had a community-acquired pneumonia as well as CHF, which was thought to be due to diastolic dysfunction.  He had an EF back in 2009.  At that time, he had a 2D echo on 2009 with the EF 50-55%, there was no evidence of recent echo.  The patient does in was apparently unable to get any of his medications as an outpatient.  He only got his antibiotics.  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  He also started having fever.  All of that started in the last 3 3 days.  He is feeling unable to move around with any exertion due to shortness of breath, but denies specifically chest pain.  Denied fever, but continues to have cough and generally unwell.  He therefore decided to come in for further management.  In the ED, the patient was found to be slightly hypoxic.  He is also tachypneic.  Hence, he is being admitted for further management.  PAST MEDICAL HISTORY:  Significant for recent pneumonia; type 2 diabetes, which is poorly controlled; obstructive sleep apnea; history of blunt trauma to the chest with cardiac injury; history of CVA with no residual weakness; history of hypertension; coronary artery  disease; depression; hyperlipidemia; history of genital herpes simplex.  ALLERGIES:  NONSTEROIDAL ANTI-INFLAMMATORY AGENTS.  MEDICATIONS:  The patient is supposed to be on: 1. Insulin, NovoLog Mix 10 units b.i.d. 2. Coreg 12.5 mg twice daily. 3. Glipizide 5 mg daily. 4. Lipitor 10 mg at bedtime. 5. Lisinopril 10 mg in the morning. 6. Metformin 500 mg twice a day. 7. Metoprolol 25 mg twice a day. 8. Seroquel 100 mg 3 tablets at bedtime. 9. Ventolin inhaler 2 puffs b.i.d. 10.Zoloft 100 mg 2 tablets every morning.  Per patient, he has not     been able to get all his medications since he is self-care.  SOCIAL HISTORY:  The patient is married, lives in [place] with his wife.  He denied tobacco, alcohol, or IV drug use.  FAMILY HISTORY:  Positive for diabetes in most of his siblings.  Mother had history of CHF as well as diabetes and cardiomyopathy.  Two of his uncles died of complication of diabetes.  REVIEW OF SYSTEMS:  All systems reviewed currently are negative  except per HPI.  PHYSICAL EXAMINATION:  VITAL SIGNS:  Temperature 100.3 rectally, his blood pressure is 187/127 with a pulse 124, respiratory rate 16, sats 93% on room air. GENERAL:  The patient is acutely ill-looking in obvious respiratory distress. HEENT:  PERRL.  EOMI.  No pallor.  No jaundice.  No rhinorrhea. NECK:  Supple.  No JVD.  No lymphadenopathy. RESPIRATORY:  He has decreased air entry at the bases with some fine crackles.  No egophony.  No wheezes.  No rales. CARDIOVASCULAR SYSTEM.  He is tachycardic. ABDOMEN:  Obese, soft, nontender with positive bowel sounds. EXTREMITIES:  He has trace edema. SKIN:  No significant rashes or ulcers. MUSCULOSKELETAL:  No joint swelling or tenderness or limitation of range of motion.  LABORATORY DATA:  His white count is 11.5 with a left shift, ANC of 9.0, hemoglobin 12.8 with an MCV of 86.7, his platelet count is 231.  Sodium is 139, potassium 3.4, chloride 101, CO2  of 28, glucose 322, BUN 11, creatinine 0.67, calcium 9.0, pro-BNP is only 554.  His ABG on room air showed a pH of 7.438, pCO2 of 38, pO2 of 65 with an oxygen sat of 93%. Chest x-ray showed low lung volumes with bilateral airspace disease present since November 26, 2010.  On the left, there appears to be mildly increased.  There is evidence of possible pulmonary edema versus bilateral pneumonia.  EKG shows sinus tachycardia with a rate of 122, normal intervals, has poor R-wave progression, but no significant ST-T wave changes.  No significant change from prior EKG from November 26, 2010.  ASSESSMENT:  This is a 40 year old gentleman presenting with acute hypoxemia more than likely secondary to healthcare-associated pneumonia as well as mild pulmonary edema from congestive heart failure.  The patient has just been treated in the hospital.  He has no recent echo. His last echo is from 2009, and he has presumed obstructive sleep apnea, but not using any CPAP, so this could be right-sided heart failure or could be biventricular failure on top of his pneumonia.  PLAN: 1. Healthcare-associated pneumonia due to the patient's recent     hospitalization. We will admit him, start him on the protocol with     cefepime, levofloxacin, and vancomycin for at least 8 days.  We     will put him on step-down unit due to his related hypoxia, monitor     him closely.  We may use BiPAP or CPAP as needed. 2. Pulmonary edema.  I will put him on some nitro drip, probably it     could be hypertensive urgency with his blood pressure the way it is     and not taking his medications.  We will gradually start him back     on the medicine he should have been taking. 3. Diabetes is also uncontrolled.  His last hemoglobin A1c in April     with 12.  I will put him back on Lantus insulin.  We need to decide     what to do maybe 70/30 insulin at the time of discharge may be     better something that the patient can actually  afford. 4. Hypertension.  Again, we will resume his home medications plus put     him on the nitro drip. 5. Hyperlipidemia.  We will continue his statin. 6. Hypokalemia.  We will replete his potassium as possible. 7. Morbid obesity.  Again, this may be contributing to his current     problems.  He may be having some obesity     hypoventilation syndrome. 8. Depression. We will resume his home medicine also as soon as     possible.  Further treatment depends on how the patient responds to     these measures.     Lonia Blood, M.D.     Verlin Grills  D:  12/15/2010  T:  12/15/2010  Job:  [id number]  Electronically Signed by Lonia Blood M.D. on 12/29/2010 11:49:33 AM

## 2011-01-06 ENCOUNTER — Encounter: Payer: Self-pay | Admitting: Physician Assistant

## 2011-01-09 ENCOUNTER — Encounter: Payer: Self-pay | Admitting: Physician Assistant

## 2011-03-22 ENCOUNTER — Other Ambulatory Visit: Payer: Self-pay

## 2011-03-22 ENCOUNTER — Inpatient Hospital Stay (HOSPITAL_COMMUNITY)
Admission: AD | Admit: 2011-03-22 | Discharge: 2011-03-26 | DRG: 293 | Disposition: A | Discharge status: Home / Self Care | Payer: Self-pay | Source: Other Acute Inpatient Hospital | Attending: Internal Medicine | Admitting: Internal Medicine

## 2011-03-22 ENCOUNTER — Emergency Department (INDEPENDENT_AMBULATORY_CARE_PROVIDER_SITE_OTHER): Payer: Self-pay

## 2011-03-22 ENCOUNTER — Encounter (HOSPITAL_BASED_OUTPATIENT_CLINIC_OR_DEPARTMENT_OTHER): Payer: Self-pay | Admitting: *Deleted

## 2011-03-22 ENCOUNTER — Emergency Department (HOSPITAL_BASED_OUTPATIENT_CLINIC_OR_DEPARTMENT_OTHER)
Admission: EM | Admit: 2011-03-22 | Discharge: 2011-03-22 | Disposition: A | Discharge status: Other Acute Inpatient Hospital | Payer: Self-pay | Attending: Emergency Medicine | Admitting: Emergency Medicine

## 2011-03-22 DIAGNOSIS — F329 Major depressive disorder, single episode, unspecified: Secondary | ICD-10-CM | POA: Insufficient documentation

## 2011-03-22 DIAGNOSIS — F319 Bipolar disorder, unspecified: Secondary | ICD-10-CM | POA: Diagnosis present

## 2011-03-22 DIAGNOSIS — I517 Cardiomegaly: Secondary | ICD-10-CM

## 2011-03-22 DIAGNOSIS — R935 Abnormal findings on diagnostic imaging of other abdominal regions, including retroperitoneum: Secondary | ICD-10-CM | POA: Diagnosis present

## 2011-03-22 DIAGNOSIS — I10 Essential (primary) hypertension: Secondary | ICD-10-CM | POA: Diagnosis present

## 2011-03-22 DIAGNOSIS — G4733 Obstructive sleep apnea (adult) (pediatric): Secondary | ICD-10-CM | POA: Diagnosis present

## 2011-03-22 DIAGNOSIS — R0602 Shortness of breath: Secondary | ICD-10-CM

## 2011-03-22 DIAGNOSIS — F3289 Other specified depressive episodes: Secondary | ICD-10-CM | POA: Insufficient documentation

## 2011-03-22 DIAGNOSIS — E669 Obesity, unspecified: Secondary | ICD-10-CM | POA: Diagnosis present

## 2011-03-22 DIAGNOSIS — I509 Heart failure, unspecified: Secondary | ICD-10-CM | POA: Insufficient documentation

## 2011-03-22 DIAGNOSIS — Z8673 Personal history of transient ischemic attack (TIA), and cerebral infarction without residual deficits: Secondary | ICD-10-CM

## 2011-03-22 DIAGNOSIS — Z8679 Personal history of other diseases of the circulatory system: Secondary | ICD-10-CM | POA: Insufficient documentation

## 2011-03-22 DIAGNOSIS — I2510 Atherosclerotic heart disease of native coronary artery without angina pectoris: Secondary | ICD-10-CM | POA: Diagnosis present

## 2011-03-22 DIAGNOSIS — I5023 Acute on chronic systolic (congestive) heart failure: Principal | ICD-10-CM | POA: Diagnosis present

## 2011-03-22 DIAGNOSIS — IMO0001 Reserved for inherently not codable concepts without codable children: Secondary | ICD-10-CM | POA: Diagnosis present

## 2011-03-22 DIAGNOSIS — I5031 Acute diastolic (congestive) heart failure: Secondary | ICD-10-CM | POA: Insufficient documentation

## 2011-03-22 LAB — CARDIAC PANEL(CRET KIN+CKTOT+MB+TROPI)
CK, MB: 3.1 ng/mL (ref 0.3–4.0)
Relative Index: 1.9 (ref 0.0–2.5)
Troponin I: <0.3 ng/mL (ref <0.30)

## 2011-03-22 LAB — BASIC METABOLIC PANEL
CO2: 23 mEq/L (ref 19–32)
Calcium: 9.6 mg/dL (ref 8.4–10.5)
Chloride: 102 mEq/L (ref 96–112)
Creatinine, Ser: 0.7 mg/dL (ref 0.50–1.35)
Glucose, Bld: 439 mg/dL — ABNORMAL HIGH (ref 70–99)

## 2011-03-22 LAB — DIFFERENTIAL
Basophils Absolute: 0 10*3/uL (ref 0.0–0.1)
Eosinophils Relative: 1 % (ref 0–5)
Lymphocytes Relative: 15 % (ref 12–46)
Lymphs Abs: 1 10*3/uL (ref 0.7–4.0)
Monocytes Absolute: 0.4 10*3/uL (ref 0.1–1.0)
Neutro Abs: 5.3 10*3/uL (ref 1.7–7.7)

## 2011-03-22 LAB — MAGNESIUM: Magnesium: 1.8 mg/dL (ref 1.5–2.5)

## 2011-03-22 LAB — CBC
HCT: 37.8 % — ABNORMAL LOW (ref 39.0–52.0)
Hemoglobin: 13 g/dL (ref 13.0–17.0)
MCV: 85.5 fL (ref 78.0–100.0)
RBC: 4.42 MIL/uL (ref 4.22–5.81)
WBC: 6.9 10*3/uL (ref 4.0–10.5)

## 2011-03-22 LAB — TSH: TSH: 0.751 u[IU]/mL (ref 0.350–4.500)

## 2011-03-22 LAB — PHOSPHORUS: Phosphorus: 4.3 mg/dL (ref 2.3–4.6)

## 2011-03-22 LAB — GLUCOSE, CAPILLARY: Glucose-Capillary: 414 mg/dL — ABNORMAL HIGH (ref 70–99)

## 2011-03-22 MED ORDER — NITROGLYCERIN 0.4 MG SL SUBL
0.4000 mg | SUBLINGUAL_TABLET | SUBLINGUAL | Status: DC | PRN
  Filled 2011-03-22: qty 25

## 2011-03-22 MED ORDER — MORPHINE SULFATE 4 MG/ML IJ SOLN
4.0000 mg | Freq: Once | INTRAMUSCULAR | Status: AC
  Administered 2011-03-22: 4 mg via INTRAVENOUS
  Filled 2011-03-22: qty 1

## 2011-03-22 MED ORDER — NITROGLYCERIN 2 % TD OINT
1.0000 [in_us] | TOPICAL_OINTMENT | Freq: Once | TRANSDERMAL | Status: AC
  Administered 2011-03-22: 1 [in_us] via TOPICAL
  Filled 2011-03-22: qty 1

## 2011-03-22 MED ORDER — FUROSEMIDE 10 MG/ML IJ SOLN
100.0000 mg | Freq: Once | INTRAVENOUS | Status: AC
  Administered 2011-03-22: 100 mg via INTRAVENOUS
  Filled 2011-03-22: qty 10

## 2011-03-22 NOTE — ED Provider Notes (Signed)
History     CSN: [id number] Arrival date & time: 03/22/2011  9:36 AM  Chief Complaint  Patient presents with  . Shortness of Breath   HPI Comments: 40yoM h/o DM, CAD, CHF EF 42% pw shortness of breath. Pt states that since d/c in may he has run out of his lasix with no refills. No lasix x1 month. Reports gradually progressive dyspnea worse over last few days. Sitting upright to sleep. +PND. Denies chest pain. States this feels like his CHF. No increase peripheral edema. Feeling SOB at rest and with exertion. +cough occ, no fever +Chills. Denies h/o VTE in self or family. No recent hosp/surg/immob. No h/o cancer. Denies exogenous hormone use, no leg pain or swelling    Past Medical History  Diagnosis Date  . Hypertension   . Hyperlipidemia   . Depression   . OSA (obstructive sleep apnea)     On CPAP; NON-COMPLIANT  . CVA (cerebral infarction)     No residual deficits  . Non-ischemic cardiomyopathy     No ischemia on myoview, showed EF of 42%. 2D echo showed EF 50-55% with diatolic dysfunction in 2009  . Post-cardiac injury syndrome     History of cardiac injury from blunt trauma  . PVCs (premature ventricular contractions)     Followed by [facility].   [name] Kitchen History of colonic polyps   . Obesity   . Syncope     Recurrent, thought to be vasovagal. Also has h/o frequent PVCs.   . CHF (congestive heart failure) 11/2010    ACUTE SYSTOLIC CHF, THIS IS A NEW DX  . Diabetes mellitus     TYPE II; UNCONTROLLED BY HEMOGLOBIN A1c; STABLE AS  PER DISCHARGE  . SOB (shortness of breath)   . Stroke   . Herpes simplex of male genitalia     Past Surgical History  Procedure Date  . Cardiac catheterization 12/19/10    DIFFUSE NONOBSTRUCTIVE CAD; NONISCHEMIC CARDIOMYOPATHY; LEFT VENTRICULAR ANGIOGRAM WAS PERFORMED SECONDARY TO  ELEVATED LEFT VENTRICULAR FILLING PRESSURES    Family History  Problem Relation Age of Onset  . Heart disease Mother     MI  . Heart failure Mother   .  Diabetes Mother     ALSO IN MOST OF HIS SIBLINGS; 2 UNLCES HAVE ALSO PASSED AWAY FROM DM  . Cardiomyopathy Mother     History  Substance Use Topics  . Smoking status: Never Smoker   . Smokeless tobacco: Not on file  . Alcohol Use: No      Review of Systems  All other systems reviewed and are negative.  except as noted HPI   Physical Exam  BP 154/104  Pulse 118  Temp(Src) 96.1 F (35.6 C) (Oral)  Resp 28  Ht 6' (1.829 m)  Wt 285 lb (129.275 kg)  BMI 38.65 kg/m2  SpO2 96%  Physical Exam  Nursing note and vitals reviewed. Constitutional: He is oriented to person, place, and time. He appears well-developed and well-nourished. No distress.  HENT:  Head: Atraumatic.  Mouth/Throat: Oropharynx is clear and moist.  Eyes: Conjunctivae are normal. Pupils are equal, round, and reactive to light.  Neck: Normal range of motion. Neck supple. JVD present.  Cardiovascular: Normal rate, regular rhythm and intact distal pulses.  Exam reveals gallop. Exam reveals no friction rub.   No murmur heard.      tachycardia  Pulmonary/Chest: Effort normal. No respiratory distress. He has no wheezes. He has rales. He exhibits tenderness.  B/l rales to mid lung fields Min R flank/chest ttp  Abdominal: Soft. Bowel sounds are normal. There is no tenderness. There is no rebound and no guarding.  Musculoskeletal: Normal range of motion. He exhibits edema. He exhibits no tenderness.       Trace pitting edema b/l LE  Lymphadenopathy:    He has no cervical adenopathy.  Neurological: He is alert and oriented to person, place, and time.  Skin: Skin is warm and dry.  Psychiatric: He has a normal mood and affect.   Labs Reviewed  CBC - Abnormal; Notable for the following:    HCT 37.8 (*)    All other components within normal limits  BASIC METABOLIC PANEL - Abnormal; Notable for the following:    Glucose, Bld 439 (*)    All other components within normal limits  PRO B NATRIURETIC PEPTIDE -  Abnormal; Notable for the following:    BNP, POC 371.1 (*)    All other components within normal limits  DIFFERENTIAL  MAGNESIUM  PHOSPHORUS  CARDIAC PANEL(CRET KIN+CKTOT+MB+TROPI)    Date: 03/22/2011  Rate: 117  Rhythm: sinus tachycardia  QRS Axis: left  Intervals: normal  ST/T Wave abnormalities: normal  Conduction Disutrbances:left anterior fascicular block  Narrative Interpretation: LVH  Old EKG Reviewed: unchanged    ED Course  Procedures  MDM 40yoM h/o mmp including CHF pw likely exacerbation of heart failure by history. DDx also includes pulmHTN, pna, ptx, less likely PE. CXR with venous congestion but no overt fluid overload apparent, certainly not as impressive as his actual lung exam. Will give lasix and ntg. Insulin for hyperglycemia. Admit to [place] for further evaluation and w/u.  Stefano Gaul, MD     Forbes Cellar, MD 03/22/11 [phone]

## 2011-03-22 NOTE — ED Notes (Signed)
Urine collected and sent to lab.

## 2011-03-22 NOTE — ED Notes (Signed)
[facility] in dept for transfer when they received bed assignment-states pt is being transferred to 4700-pt aware

## 2011-03-22 NOTE — ED Notes (Signed)
Pt states he has several day history of being more short of breath than usual pt states has been out of his "fluid pills" for "awhile" pt states unable to lie down has been sleeping sitting up for several nights.

## 2011-03-23 DIAGNOSIS — I519 Heart disease, unspecified: Secondary | ICD-10-CM

## 2011-03-23 LAB — GLUCOSE, CAPILLARY
Glucose-Capillary: 211 mg/dL — ABNORMAL HIGH (ref 70–99)
Glucose-Capillary: 211 mg/dL — ABNORMAL HIGH (ref 70–99)

## 2011-03-23 LAB — COMPREHENSIVE METABOLIC PANEL
ALT: 27 U/L (ref 0–53)
AST: 22 U/L (ref 0–37)
Albumin: 3.5 g/dL (ref 3.5–5.2)
Alkaline Phosphatase: 81 U/L (ref 39–117)
Chloride: 102 mEq/L (ref 96–112)
Potassium: 3.2 mEq/L — ABNORMAL LOW (ref 3.5–5.1)
Sodium: 140 mEq/L (ref 135–145)
Total Bilirubin: 0.4 mg/dL (ref 0.3–1.2)
Total Protein: 7.1 g/dL (ref 6.0–8.3)

## 2011-03-24 ENCOUNTER — Inpatient Hospital Stay (HOSPITAL_COMMUNITY): Payer: Self-pay

## 2011-03-24 LAB — BASIC METABOLIC PANEL
BUN: 17 mg/dL (ref 6–23)
Calcium: 9.2 mg/dL (ref 8.4–10.5)
Chloride: 102 mEq/L (ref 96–112)
Creatinine, Ser: 0.8 mg/dL (ref 0.50–1.35)
GFR calc Af Amer: >60 mL/min (ref >60)
GFR calc non Af Amer: >60 mL/min (ref >60)

## 2011-03-24 LAB — URINALYSIS, MICROSCOPIC ONLY
Glucose, UA: 100 mg/dL — AB
Hgb urine dipstick: NEGATIVE
Ketones, ur: NEGATIVE mg/dL
Leukocytes, UA: NEGATIVE
pH: 5 (ref 5.0–8.0)

## 2011-03-24 LAB — GLUCOSE, CAPILLARY
Glucose-Capillary: 221 mg/dL — ABNORMAL HIGH (ref 70–99)
Glucose-Capillary: 278 mg/dL — ABNORMAL HIGH (ref 70–99)
Glucose-Capillary: 224 mg/dL — ABNORMAL HIGH (ref 70–99)

## 2011-03-25 ENCOUNTER — Encounter (HOSPITAL_COMMUNITY): Payer: Self-pay | Admitting: Radiology

## 2011-03-25 ENCOUNTER — Inpatient Hospital Stay (HOSPITAL_COMMUNITY): Payer: Self-pay

## 2011-03-25 LAB — BASIC METABOLIC PANEL
BUN: 24 mg/dL — ABNORMAL HIGH (ref 6–23)
Calcium: 9.7 mg/dL (ref 8.4–10.5)
GFR calc Af Amer: >60 mL/min (ref >60)
GFR calc non Af Amer: >60 mL/min (ref >60)
Glucose, Bld: 296 mg/dL — ABNORMAL HIGH (ref 70–99)

## 2011-03-25 LAB — URINE CULTURE
Colony Count: NO GROWTH
Culture  Setup Time: 201208241625

## 2011-03-25 LAB — GLUCOSE, CAPILLARY: Glucose-Capillary: 220 mg/dL — ABNORMAL HIGH (ref 70–99)

## 2011-03-25 MED ORDER — IOHEXOL 300 MG/ML  SOLN
100.0000 mL | Freq: Once | INTRAMUSCULAR | Status: AC | PRN
  Administered 2011-03-25: 100 mL via INTRAVENOUS

## 2011-03-26 LAB — GLUCOSE, CAPILLARY: Glucose-Capillary: 174 mg/dL — ABNORMAL HIGH (ref 70–99)

## 2011-03-28 NOTE — H&P (Signed)
NAMETAGGERT, BOZZI NO.:  [id number]  MEDICAL RECORD NO.:  [id number]  LOCATION:  4739                         FACILITY:  [facility]  PHYSICIAN:  [name]. Bensimhon, MDDATE OF BIRTH:  [birth year]  DATE OF ADMISSION:  03/22/2011 DATE OF DISCHARGE:                             HISTORY & PHYSICAL   PRIMARY CARE PHYSICIAN:  [facility], but his card lapsed.  CARDIOLOGIST:  [name]. Bensimhon, MD  Mitchell [name] is a 40 year old male with multiple medical problems including obesity, uncontrolled hypertension, uncontrolled diabetes, obstructive sleep apnea, bipolar disease/schizophrenia, and congestive heart failure secondary to nonischemic cardiomyopathy.  I saw him back in clinic in 2009.  At that time, his EF was about 42%.  He then was lost to followup.  He was admitted in both April and May of this year with a variety of complaints.  He was treated for congestive heart failure.  He was discharged on Dec 21, 2010.  He presented then to the [facility] today complaining of class 4 heart failure symptoms after being out of his Lasix for about a month.  He says he gets short of breath with any activity to the point where he is having his children tie his shoes.  He denies any chest pain.  He has had significant orthopnea and PND.  He has not been following his weight at home.  He says he has been taking his other heart medicines, but does not know them well.  He has not been checking his weights or his sugars.  On arrival to the [facility], he was in significant respiratory distress and was tachycardic.  Chest x-ray showed mild pulmonary edema with a BNP of 371.  He was triaged by the ER physician, given 100 mg of IV Lasix and some morphine.  He had good improvement in his symptoms with a brisk diuresis of about a liter.  He now says he feels better, though not back to his previous baseline.  REVIEW OF SYSTEMS:  On review of systems,  he denies any palpitations or syncope.  He does struggle with schizophrenia and bipolar disease and has been maintained on Seroquel.  He denies any nausea or vomiting. Remainder of review of systems are all negative except for HPI and problem list.  PROBLEM LIST: 1. Congestive heart failure secondary to systolic heart failure.     a.     Cardiac catheterization in May 2012 showed diffuse but      nonobstructive coronary artery disease, ejection fraction at that      time was 20-25%. 2. Nonobstructive coronary artery disease by cardiac catheterization     in May 2012. 3. Hypertension, uncontrolled. 4. Diabetes mellitus, uncontrolled. 5. Obesity. 6. Obstructive sleep apnea, on continuous positive airway pressure. 7. Hyperlipidemia. 8. Bipolar disease/schizophrenia. 9. History of previous cerebrovascular accident x3, details unclear.  HOME MEDICATIONS:  Reportedly to be on: 1. Lopressor 25 b.i.d. 2. Potassium 40 mEq a day, which he out of. 3. Lasix 40 a day which he has been out of for a month. 4. Pravachol 40 a day. 5. Metformin 500 b.i.d. 6. Lisinopril 10 a day. 7. Seroquel 600 a  day. 8. Zoloft 100 a day. 9. Insulin 70/30, 28 units subcu b.i.d., but he is out of that as     well.  ALLERGIES:  NONSTEROIDAL ANTIINFLAMMATORY DRUGS which causes whelps.  SOCIAL HISTORY:  He is married.  He has four children, two of which live at home.  He denies any tobacco, alcohol, or drug use.  FAMILY HISTORY:  Mother has heart failure and diabetes.  Siblings have diabetes and a daughter.  Father has heart failure as well.  PHYSICAL EXAMINATION:  GENERAL:  He is lying in bed.  He is in no acute distress.  He is a little bit groggy, but unable to answer all questions without difficulty. VITAL SIGNS:  Blood pressure is 150/102.  Heart rate is 115.  He is afebrile.  He is saturating 96% on 2 liters.  His weight is 126.8 kg. HEENT:  Normal. NECK:  Thick and supple.  It is hard to assess his  JVD, but appears elevated.  Carotids are 1+ bilaterally with no bruits.  There is no lymphadenopathy or thyromegaly appreciated. CARDIAC:  His PMI is laterally displaced.  He is tachycardic with a summation gallop.  No obvious murmurs, though heart sounds are distant. LUNGS:  Mild crackles at the bases. ABDOMEN:  Markedly obese, but nontender.  Hypoactive bowel sounds. EXTREMITIES:  Mildly cold with no significant cyanosis, clubbing, or edema. NEURO:  Alert and oriented x3.  Cranial nerves II-XII are intact.  Moves all 4 extremities without difficulty.  Affect is pleasant.  Sodium 137, potassium 3.6, BUN of 8, and creatinine 0.7.  BNP is 371. White count of 6.9, hemoglobin 13.0, and platelets of 165.  Magnesium is 1.8.  EKG shows sinus tachycardia, rate of 117 with LVH.  QRS duration is 102 milliseconds.  There is a left anterior fascicular block as well.  ASSESSMENT: 1. Class IV acute on chronic systolic heart failure. 2. Nonischemic cardiomyopathy with ejection fraction of 20-25%. 3. Diabetes, uncontrolled. 4. Hypertension, uncontrolled. 5. Obstructive sleep apnea. 6. Bipolar/schizophrenia. 7. Medical noncompliance.  PLAN/DISCUSSION:  Mitchell [name] has class 4 heart failure in the setting of mild to moderate volume overload after missing his Lasix for a month. We will diuresed him and initiated aggressive afterload reduction with ACE inhibitors and spironolactone.  We will also start digoxin.  We will not start beta-blocker at this time due to acute decompensation.  We will check a thyroid panel as well as repeat his echocardiogram to reevaluate his LV function and rule out any evidence of an LV clot.  I suspect he may need inotrope, but we will hold off on these for now.  We will resume his insulin and cover him with sliding scale insulin regimen.  I had a long talk with him about the need for medical compliance.  I suspect down the road he will need advanced therapies, however,  we will have to assess his candidacy for these.  I not think he will be a transplant candidate, but he may be a possible LVAD candidate. We will have to continue to follow him in this regard.     [name]. Bensimhon, MD     DRB/MEDQ  D:  03/22/2011  T:  03/22/2011  Job:  [id number]  Electronically Signed by Arvilla Meres MD on 03/28/2011 05:29:53 PM

## 2011-03-30 ENCOUNTER — Ambulatory Visit (HOSPITAL_COMMUNITY): Payer: Self-pay

## 2011-04-19 NOTE — Discharge Summary (Signed)
NAMEMARTISE, Mitchell [name] NO.:  [id number]  MEDICAL RECORD NO.:  [id number]  LOCATION:  4739                         FACILITY:  [facility]  PHYSICIAN:  [name]. Robt Okuda, MDDATE OF BIRTH:  1971/07/16  DATE OF ADMISSION:  03/22/2011 DATE OF DISCHARGE:  03/26/2011                              DISCHARGE SUMMARY   PRIMARY CARE PHYSICIAN:  [facility].  PRIMARY CARDIOLOGIST:  [name]. Marquavius Scaife, MD  PROCEDURES PERFORMED DURING HOSPITALIZATION: 1. Echocardiogram completed March 23, 2011, revealing LV size     moderately dilated, systolic function severely reduced.  Estimated     EF with the range of 25% to 30% with diffuse hypokinesis.  The     study was not technically sufficient to allow for evaluation of LV     diastolic function. 2. Renal ultrasound dated March 25, 2011, revealing likely dromedary     hump arising within the left kidney, cannot entirely exclude this     representing an underlying lesion. 3. CT scan of the abdomen without contrast revealing normal exam.     There is no evidence of left renal mass.  The mass-like appearance     in the left kidney on yesterday's ultrasound secondary to dromedary     hump.  FINAL DISCHARGE DIAGNOSES: 1. Acute on chronic systolic congestive heart failure.     a.     Nonischemic cardiomyopathy with an ejection fraction of 20%      to 25% per echocardiogram on March 25, 2011. 2. Medical noncompliance. 3. Diabetes mellitus type 2 (uncontrolled). 4. Obstructive sleep apnea. 5. Obesity. 6. Hypertension, uncontrolled. 7. Bipolar disease/schizophrenia. 8. History of previous cerebrovascular accident x3 unclear details.  HOSPITAL COURSE:  This is a 40 year old patient with multiple medical problems as stated above who was seen in Dr. Prescott Gum clinic most recently in 2009 with an EF about 42% at that time.  He was lost to followup and had been followed by [facility] and has also had a history of  noncompliance and uncertain how often he had been seeing a physician. The patient was then admitted in April and May 2012 for a variety of complaints of which congestive heart failure was a part of those admissions.  He came to [facility] complaining of class IV heart failure symptoms on day of admission.  He had been on his medications to include his Lasix for 1 month.  On evaluation at [facility], the patient was found to be in respiratory distress and tachycardic.  His chest x- ray revealed mild pulmonary edema with a BNP if 371.  He was treated with IV Lasix 100 mg and morphine and began to diurese.  He was followed by Dr. Gala Romney and congestive heart failure clinic team throughout hospitalization.  He was found to have some mild hypokalemia, which was repleted.  He also diuresed very well over the first 24 hours with a I and O of less than 3200 mL.  The patient's weight slowly improved with an average up to 2000-2500 mL a day diuresis.  The patient's medications were adjusted to include addition of spironolactone.  He was also started on lisinopril, Coreg, and digoxin.  He  was seen up by [facility] for assistance with medications along with followup weights and providing a scale.  The patient was also given dietary education and instructions for low-salt diet.  He was enrolled in a Weigh and Stay healthy program where he is followed by Sabino Niemann, the social worker that allows him to have a scale on return home.  During hospitalization, the patient had a renal ultrasound secondary to uncontrolled hypertension.  Renal ultrasound did show renomegaly, but no hydronephrosis.  It was noted that he found there was a questionable mass on the left kidney, which was described likely dromedary hump with a followup CT scan the same day, which did confirm this with no evidence of a renal mass.  The patient's weight was followed very closely.  Admission weight  was 126.8 kg.  On discharge, his weight was 123.8 kg.  The patient's medications were closely reviewed by Dr. Gala Romney and list below has been suggested by Dr. Gala Romney with quick followup in the Heart Failure Clinic on discharge.  VITAL SIGNS ON DISCHARGE:  Blood pressure 103/71, pulse 107, respirations 18, temperature 98.6, O2 sat 98% on room air.  As stated, the patient's weight was 123.8 kg.  LABS ON DISCHARGE:  BNP 59.3 (on admission 37.1).  Sodium 136, potassium 4.6, chloride 98, CO2 of 29, glucose 296, BUN 24, creatinine 1.13. Urinalysis was negative for UTI.  LFTs; bilirubin 0.4, alkaline phosphatase 81, SGOT 22, SGPT 27, total protein 7.1, albumin 3.5, calcium 9.1, TSH 0.751.  Cardiac enzymes were found to be negative x2. Phosphorus 4.3.  RADIOLOGY:  Chest x-ray dated March 22, 2011, cardiac enlargement with pulmonary venous congestion.  Renal ultrasound completed March 25, 2011, as described above.  CT of the abdomen March 25, 2011, as described above.  DISCHARGE MEDICATIONS: 1. Albuterol inhaler 90 mcg 2 puffs inhaled q.4 h. 2. Aspirin 81 mg one by mouth daily. 3. Carvedilol 3.125 mg 1 tablet by mouth daily (prescription     provided). 4. Digoxin 0.25 mg 1 tablet by mouth daily (new prescription     provided). 5. Furosemide 40 mg 1 tablet by mouth daily (new prescription     provided). 6. Hydrocodone/acetaminophen 5/325 one tablet by mouth every 6 hours     p.r.n. (prescription provided). 7. Potassium chloride 20 mEq one by mouth daily (new prescription     provided). 8. Spironolactone 25 mg 1 tablet by mouth daily (new prescription     provided). 9. Advil PM over-the-counter 2 tablets by mouth daily at bedtime     p.r.n. 10.Metformin 500 mg b.i.d. 11.Pravachol 40 mg by mouth daily at bedtime. 12.Seroquel 300 mg 2 tablets by mouth daily at bedtime. 13.Tylenol over-the-counter 2 tablets by mouth daily as needed. 14.Zoloft 100 mg 1 tablet by mouth  b.i.d.  The patient is told to stop taking metoprolol 25 mg by mouth b.i.d.  ALLERGIES:  NSAIDS, [employer] INHIBITORS.  FOLLOWUP PLANS AND APPOINTMENT: 1. The patient has free scheduled appointment in Heart Failure Clinic     on March 30, 2011, at 2:15. 2. The patient is provided a scale which he is to pick up from the     gift shop at [facility] and has been told to weigh daily and avoid     salt. 3. The patient has been given prescriptions for above medications     listed. 4. The patient has been advised to bring all medications to followup     appointments.  Time spent on the patient  to include physician time 40 minutes.     Bettey Mare. Lyman Bishop, NP   ______________________________ [name]. Adeola Dennen, MD    KML/MEDQ  D:  03/26/2011  T:  03/26/2011  Job:  [id number]  Electronically Signed by Joni Reining NP on 04/18/2011 04:45:51 PM Electronically Signed by Arvilla Meres MD on 04/19/2011 10:37:44 PM

## 2011-04-20 LAB — I-STAT 8, (EC8 V) (CONVERTED LAB)
Acid-Base Excess: 3 — ABNORMAL HIGH
Bicarbonate: 28.9 — ABNORMAL HIGH
Glucose, Bld: 337 — ABNORMAL HIGH
HCT: 45
Hemoglobin: 15.3
Operator id: 116391
Sodium: 138
TCO2: 30

## 2011-04-20 LAB — POCT URINALYSIS DIP (DEVICE)
Bilirubin Urine: NEGATIVE
Glucose, UA: >=1000 — AB
Operator id: 116391
Specific Gravity, Urine: 1.01

## 2011-04-20 LAB — POCT I-STAT CREATININE
Creatinine, Ser: 0.8
Operator id: 116391

## 2011-04-21 LAB — DIFFERENTIAL
Eosinophils Absolute: 0.1
Lymphocytes Relative: 5 — ABNORMAL LOW
Lymphs Abs: 0.6 — ABNORMAL LOW
Neutrophils Relative %: 88 — ABNORMAL HIGH

## 2011-04-21 LAB — I-STAT 8, (EC8 V) (CONVERTED LAB)
BUN: 11
Chloride: 102
Glucose, Bld: 251 — ABNORMAL HIGH
HCT: 43
Operator id: 270651
Potassium: 3.9
TCO2: 31
pCO2, Ven: 47.5
pCO2, Ven: 50.9 — ABNORMAL HIGH
pH, Ven: 7.405 — ABNORMAL HIGH

## 2011-04-21 LAB — COMPREHENSIVE METABOLIC PANEL
BUN: 9
CO2: 29
Calcium: 8.7
Creatinine, Ser: 0.8
GFR calc non Af Amer: >60
Glucose, Bld: 258 — ABNORMAL HIGH

## 2011-04-21 LAB — CK TOTAL AND CKMB (NOT AT ARMC): Total CK: 134

## 2011-04-21 LAB — URINALYSIS, ROUTINE W REFLEX MICROSCOPIC
Bilirubin Urine: NEGATIVE
Glucose, UA: 500 — AB
Leukocytes, UA: NEGATIVE
Nitrite: NEGATIVE
Specific Gravity, Urine: 1.035 — ABNORMAL HIGH
pH: 6

## 2011-04-21 LAB — CBC
MCHC: 33.8
MCV: 88.9
Platelets: 165

## 2011-04-21 LAB — POCT I-STAT CREATININE
Creatinine, Ser: 0.9
Operator id: 270651

## 2011-04-21 LAB — URINE MICROSCOPIC-ADD ON

## 2011-04-21 LAB — PROTIME-INR
INR: 1
Prothrombin Time: 12.9

## 2011-04-21 LAB — TROPONIN I: Troponin I: 0.03

## 2011-04-21 LAB — POCT CARDIAC MARKERS: CKMB, poc: <1 — ABNORMAL LOW

## 2011-04-21 LAB — TYPE AND SCREEN: ABO/RH(D): O POS

## 2011-04-27 LAB — POCT I-STAT 3, VENOUS BLOOD GAS (G3P V)
Operator id: 270651
pCO2, Ven: 54.2 — ABNORMAL HIGH
pH, Ven: 7.36 — ABNORMAL HIGH
pO2, Ven: 26 — CL

## 2011-04-27 LAB — POCT I-STAT, CHEM 8
Chloride: 102
Glucose, Bld: 337 — ABNORMAL HIGH
HCT: 39
Hemoglobin: 13.3
Potassium: 3.8

## 2011-04-27 LAB — URINALYSIS, ROUTINE W REFLEX MICROSCOPIC
Glucose, UA: >1000 — AB
Hgb urine dipstick: NEGATIVE
Leukocytes, UA: NEGATIVE
Specific Gravity, Urine: 1.039 — ABNORMAL HIGH
pH: 5.5

## 2011-04-27 LAB — URINE MICROSCOPIC-ADD ON

## 2011-05-01 LAB — DIFFERENTIAL
Basophils Absolute: 0.2 — ABNORMAL HIGH
Basophils Relative: 2 — ABNORMAL HIGH
Eosinophils Absolute: 0.2
Eosinophils Relative: 2
Neutrophils Relative %: 71

## 2011-05-01 LAB — URINALYSIS, ROUTINE W REFLEX MICROSCOPIC
Bilirubin Urine: NEGATIVE
Glucose, UA: >1000 — AB
Hgb urine dipstick: NEGATIVE
Protein, ur: NEGATIVE
Specific Gravity, Urine: 1.035 — ABNORMAL HIGH
Urobilinogen, UA: 0.2

## 2011-05-01 LAB — COMPREHENSIVE METABOLIC PANEL
ALT: 33
AST: 28
Alkaline Phosphatase: 100
CO2: 26
Chloride: 102
GFR calc Af Amer: >60
GFR calc non Af Amer: >60
Glucose, Bld: 416 — ABNORMAL HIGH
Sodium: 141
Total Bilirubin: 0.4

## 2011-05-01 LAB — BASIC METABOLIC PANEL
CO2: 21
Chloride: 106
Creatinine, Ser: 0.7
GFR calc Af Amer: >60
Glucose, Bld: 390 — ABNORMAL HIGH

## 2011-05-01 LAB — HERPES SIMPLEX VIRUS CULTURE: Culture: DETECTED

## 2011-05-01 LAB — CBC
Hemoglobin: 13.1
RBC: 4.32
WBC: 9.6

## 2011-05-01 LAB — GLUCOSE, CAPILLARY: Glucose-Capillary: 361 — ABNORMAL HIGH

## 2011-05-01 LAB — URINE MICROSCOPIC-ADD ON

## 2011-05-09 LAB — I-STAT 8, (EC8 V) (CONVERTED LAB)
BUN: 11
BUN: 9
Bicarbonate: 26.6 — ABNORMAL HIGH
Bicarbonate: 27.4 — ABNORMAL HIGH
Chloride: 104
Glucose, Bld: 300 — ABNORMAL HIGH
Glucose, Bld: 341 — ABNORMAL HIGH
HCT: 44
Hemoglobin: 14.3
Hemoglobin: 15
Operator id: 285491
Sodium: 138
pCO2, Ven: 38.8 — ABNORMAL LOW
pCO2, Ven: 39.5 — ABNORMAL LOW

## 2011-05-09 LAB — CBC
HCT: 38.2 — ABNORMAL LOW
HCT: 38.5 — ABNORMAL LOW
HCT: 39.5
Hemoglobin: 12.8 — ABNORMAL LOW
Hemoglobin: 13.2
MCHC: 33.5
MCHC: 33.4
MCHC: 33.6
MCV: 89.8
MCV: 89.4
MCV: 89.9
Platelets: 180
RBC: 4.51
RBC: 4.42
RDW: 12.9
RDW: 12.7
WBC: 12.1 — ABNORMAL HIGH
WBC: 8.1

## 2011-05-09 LAB — DIFFERENTIAL
Basophils Absolute: 0
Basophils Relative: 0
Basophils Relative: 0
Eosinophils Absolute: 0
Eosinophils Absolute: 0.1
Eosinophils Relative: 0
Eosinophils Relative: 1
Lymphocytes Relative: 19
Lymphs Abs: 1.9
Lymphs Abs: 1.3
Monocytes Absolute: 0.5
Neutro Abs: 10.7 — ABNORMAL HIGH
Neutrophils Relative %: 72
Neutrophils Relative %: 77

## 2011-05-09 LAB — MAGNESIUM
Magnesium: 1.6
Magnesium: 2

## 2011-05-09 LAB — COMPREHENSIVE METABOLIC PANEL
ALT: 24
ALT: 25
AST: 29
AST: 26
AST: 31
Albumin: 3.5
Alkaline Phosphatase: 72
Alkaline Phosphatase: 74
BUN: 8
CO2: 29
CO2: 24
Calcium: 9.4
Chloride: 100
Chloride: 102
Creatinine, Ser: 1.17
GFR calc Af Amer: >60
GFR calc Af Amer: >60
GFR calc non Af Amer: >60
GFR calc non Af Amer: >60
Glucose, Bld: 137 — ABNORMAL HIGH
Glucose, Bld: 281 — ABNORMAL HIGH
Potassium: 3.8
Potassium: 4.2
Sodium: 135
Total Bilirubin: 0.7
Total Protein: 7.4

## 2011-05-09 LAB — HEMOGLOBIN A1C
Hgb A1c MFr Bld: 12.4 — ABNORMAL HIGH
Mean Plasma Glucose: 364

## 2011-05-09 LAB — URINALYSIS, ROUTINE W REFLEX MICROSCOPIC
Leukocytes, UA: NEGATIVE
Nitrite: NEGATIVE
Specific Gravity, Urine: 1.026
pH: 5.5

## 2011-05-09 LAB — CARDIAC PANEL(CRET KIN+CKTOT+MB+TROPI)
Relative Index: 0.7
Relative Index: 0.8
Total CK: 147
Troponin I: 0.01

## 2011-05-09 LAB — CK TOTAL AND CKMB (NOT AT ARMC)
CK, MB: 1.1
Relative Index: 0.7
Total CK: 172

## 2011-05-09 LAB — BASIC METABOLIC PANEL
BUN: 8
BUN: 9
CO2: 31
Calcium: 9.1
Chloride: 101
Creatinine, Ser: 0.78
Creatinine, Ser: 0.84
GFR calc non Af Amer: >60
Glucose, Bld: 126 — ABNORMAL HIGH

## 2011-05-09 LAB — POCT I-STAT CREATININE
Creatinine, Ser: 0.7
Creatinine, Ser: 0.8
Operator id: 285491

## 2011-05-09 LAB — POCT CARDIAC MARKERS
CKMB, poc: <1 — ABNORMAL LOW
Myoglobin, poc: 61.7
Myoglobin, poc: 70.9

## 2011-05-09 LAB — PROTIME-INR
INR: 1
Prothrombin Time: 12.9

## 2011-05-09 LAB — LIPID PANEL
Triglycerides: 210 — ABNORMAL HIGH
VLDL: 42 — ABNORMAL HIGH

## 2011-05-09 LAB — TSH: TSH: 1.236

## 2011-05-09 LAB — URINE MICROSCOPIC-ADD ON

## 2011-05-09 LAB — RAPID URINE DRUG SCREEN, HOSP PERFORMED
Barbiturates: NOT DETECTED
Cocaine: NOT DETECTED
Opiates: NOT DETECTED

## 2011-05-09 LAB — TROPONIN I: Troponin I: 0.02

## 2011-05-09 LAB — LIPASE, BLOOD: Lipase: 28

## 2011-05-10 LAB — POCT CARDIAC MARKERS
Operator id: 234501
Troponin i, poc: <0.05

## 2011-05-10 LAB — CBC
HCT: 42.2
MCV: 89.2
Platelets: 191
RDW: 12.4

## 2011-05-10 LAB — DIFFERENTIAL
Basophils Absolute: 0
Eosinophils Absolute: 0.1
Eosinophils Relative: 1

## 2011-05-10 LAB — URINALYSIS, ROUTINE W REFLEX MICROSCOPIC
Bilirubin Urine: NEGATIVE
Protein, ur: 100 — AB
Specific Gravity, Urine: 1.036 — ABNORMAL HIGH
Urobilinogen, UA: 0.2

## 2011-05-10 LAB — POCT I-STAT CREATININE: Operator id: 234501

## 2011-05-10 LAB — RAPID URINE DRUG SCREEN, HOSP PERFORMED
Amphetamines: NOT DETECTED
Barbiturates: NOT DETECTED
Benzodiazepines: NOT DETECTED
Opiates: NOT DETECTED

## 2011-05-10 LAB — I-STAT 8, (EC8 V) (CONVERTED LAB)
Bicarbonate: 21.7
Glucose, Bld: 415 — ABNORMAL HIGH
TCO2: 23
pH, Ven: 7.375 — ABNORMAL HIGH

## 2011-05-10 LAB — URINE MICROSCOPIC-ADD ON

## 2011-09-22 ENCOUNTER — Emergency Department (HOSPITAL_COMMUNITY): Payer: Medicaid Other

## 2011-09-22 ENCOUNTER — Inpatient Hospital Stay (HOSPITAL_COMMUNITY)
Admission: EM | Admit: 2011-09-22 | Discharge: 2011-09-25 | DRG: 292 | Disposition: A | Discharge status: Home / Self Care | Payer: Medicaid Other | Attending: Cardiology | Admitting: Cardiology

## 2011-09-22 ENCOUNTER — Encounter (HOSPITAL_COMMUNITY): Payer: Self-pay

## 2011-09-22 ENCOUNTER — Other Ambulatory Visit: Payer: Self-pay

## 2011-09-22 DIAGNOSIS — Z8673 Personal history of transient ischemic attack (TIA), and cerebral infarction without residual deficits: Secondary | ICD-10-CM

## 2011-09-22 DIAGNOSIS — E118 Type 2 diabetes mellitus with unspecified complications: Secondary | ICD-10-CM | POA: Diagnosis present

## 2011-09-22 DIAGNOSIS — I059 Rheumatic mitral valve disease, unspecified: Secondary | ICD-10-CM | POA: Diagnosis present

## 2011-09-22 DIAGNOSIS — Z91199 Patient's noncompliance with other medical treatment and regimen due to unspecified reason: Secondary | ICD-10-CM

## 2011-09-22 DIAGNOSIS — IMO0002 Reserved for concepts with insufficient information to code with codable children: Secondary | ICD-10-CM | POA: Diagnosis present

## 2011-09-22 DIAGNOSIS — I5023 Acute on chronic systolic (congestive) heart failure: Principal | ICD-10-CM

## 2011-09-22 DIAGNOSIS — I10 Essential (primary) hypertension: Secondary | ICD-10-CM | POA: Diagnosis present

## 2011-09-22 DIAGNOSIS — E785 Hyperlipidemia, unspecified: Secondary | ICD-10-CM | POA: Diagnosis present

## 2011-09-22 DIAGNOSIS — E669 Obesity, unspecified: Secondary | ICD-10-CM | POA: Diagnosis present

## 2011-09-22 DIAGNOSIS — I428 Other cardiomyopathies: Secondary | ICD-10-CM | POA: Diagnosis present

## 2011-09-22 DIAGNOSIS — I509 Heart failure, unspecified: Secondary | ICD-10-CM

## 2011-09-22 DIAGNOSIS — Z9119 Patient's noncompliance with other medical treatment and regimen: Secondary | ICD-10-CM

## 2011-09-22 DIAGNOSIS — E119 Type 2 diabetes mellitus without complications: Secondary | ICD-10-CM

## 2011-09-22 DIAGNOSIS — G4733 Obstructive sleep apnea (adult) (pediatric): Secondary | ICD-10-CM | POA: Diagnosis present

## 2011-09-22 HISTORY — DX: Headache: R51

## 2011-09-22 HISTORY — DX: Acute myocardial infarction, unspecified: I21.9

## 2011-09-22 LAB — GLUCOSE, CAPILLARY: Glucose-Capillary: 276 mg/dL — ABNORMAL HIGH (ref 70–99)

## 2011-09-22 LAB — CBC
HCT: 37.6 % — ABNORMAL LOW (ref 39.0–52.0)
MCH: 29.8 pg (ref 26.0–34.0)
MCH: 30 pg (ref 26.0–34.0)
MCHC: 34.8 g/dL (ref 30.0–36.0)
MCHC: 35 g/dL (ref 30.0–36.0)
MCV: 85.8 fL (ref 78.0–100.0)
Platelets: 170 10*3/uL (ref 150–400)
RDW: 12.7 % (ref 11.5–15.5)
RDW: 12.6 % (ref 11.5–15.5)
WBC: 8.6 10*3/uL (ref 4.0–10.5)

## 2011-09-22 LAB — POCT I-STAT, CHEM 8
Calcium, Ion: 1.18 mmol/L (ref 1.12–1.32)
Creatinine, Ser: 0.5 mg/dL (ref 0.50–1.35)
Glucose, Bld: 432 mg/dL — ABNORMAL HIGH (ref 70–99)
Hemoglobin: 13.9 g/dL (ref 13.0–17.0)
Sodium: 140 mEq/L (ref 135–145)
TCO2: 25 mmol/L (ref 0–100)

## 2011-09-22 LAB — CARDIAC PANEL(CRET KIN+CKTOT+MB+TROPI)
CK, MB: 3.1 ng/mL (ref 0.3–4.0)
Relative Index: 1.8 (ref 0.0–2.5)
Total CK: 170 U/L (ref 7–232)
Troponin I: <0.3 ng/mL (ref <0.30)
Troponin I: <0.3 ng/mL (ref <0.30)

## 2011-09-22 LAB — MRSA PCR SCREENING: MRSA by PCR: NEGATIVE

## 2011-09-22 LAB — PRO B NATRIURETIC PEPTIDE: Pro B Natriuretic peptide (BNP): 434.7 pg/mL — ABNORMAL HIGH (ref 0–125)

## 2011-09-22 LAB — CREATININE, SERUM: GFR calc Af Amer: >90 mL/min (ref >90)

## 2011-09-22 MED ORDER — INSULIN ASPART 100 UNIT/ML ~~LOC~~ SOLN
0.0000 [IU] | Freq: Three times a day (TID) | SUBCUTANEOUS | Status: DC
  Administered 2011-09-22: 11 [IU] via SUBCUTANEOUS
  Administered 2011-09-23: 4 [IU] via SUBCUTANEOUS
  Filled 2011-09-22: qty 3
  Filled 2011-09-22: qty 1

## 2011-09-22 MED ORDER — FUROSEMIDE 10 MG/ML IJ SOLN
40.0000 mg | Freq: Three times a day (TID) | INTRAMUSCULAR | Status: DC
  Administered 2011-09-22 – 2011-09-23 (×2): 40 mg via INTRAVENOUS
  Filled 2011-09-22 (×5): qty 4

## 2011-09-22 MED ORDER — SODIUM CHLORIDE 0.9 % IJ SOLN
3.0000 mL | Freq: Two times a day (BID) | INTRAMUSCULAR | Status: DC
  Administered 2011-09-23 – 2011-09-24 (×2): 3 mL via INTRAVENOUS

## 2011-09-22 MED ORDER — INSULIN GLARGINE 100 UNIT/ML ~~LOC~~ SOLN: 8.0000 [IU] | Freq: Every day | SUBCUTANEOUS | Status: DC

## 2011-09-22 MED ORDER — ONDANSETRON HCL 4 MG/2ML IJ SOLN
4.0000 mg | Freq: Four times a day (QID) | INTRAMUSCULAR | Status: DC | PRN
  Administered 2011-09-24: 4 mg via INTRAVENOUS
  Filled 2011-09-22: qty 18

## 2011-09-22 MED ORDER — ASPIRIN 81 MG PO TBEC: 81.0000 mg | DELAYED_RELEASE_TABLET | Freq: Every day | ORAL | Status: DC

## 2011-09-22 MED ORDER — FUROSEMIDE 10 MG/ML IJ SOLN
40.0000 mg | Freq: Once | INTRAMUSCULAR | Status: AC
  Administered 2011-09-22: 40 mg via INTRAVENOUS
  Filled 2011-09-22: qty 4

## 2011-09-22 MED ORDER — INSULIN ASPART 100 UNIT/ML ~~LOC~~ SOLN
12.0000 [IU] | Freq: Once | SUBCUTANEOUS | Status: AC
  Administered 2011-09-22: 12 [IU] via SUBCUTANEOUS
  Filled 2011-09-22: qty 1

## 2011-09-22 MED ORDER — INSULIN ASPART 100 UNIT/ML ~~LOC~~ SOLN
0.0000 [IU] | Freq: Every day | SUBCUTANEOUS | Status: DC
  Administered 2011-09-22: 3 [IU] via SUBCUTANEOUS

## 2011-09-22 MED ORDER — SIMVASTATIN 20 MG PO TABS
20.0000 mg | ORAL_TABLET | Freq: Every day | ORAL | Status: DC
  Administered 2011-09-22 – 2011-09-24 (×3): 20 mg via ORAL
  Filled 2011-09-22 (×4): qty 1

## 2011-09-22 MED ORDER — LISINOPRIL 10 MG PO TABS
10.0000 mg | ORAL_TABLET | Freq: Every day | ORAL | Status: DC
  Administered 2011-09-23 – 2011-09-25 (×3): 10 mg via ORAL
  Filled 2011-09-22 (×4): qty 1

## 2011-09-22 MED ORDER — GLIPIZIDE ER 10 MG PO TB24
10.0000 mg | ORAL_TABLET | Freq: Every day | ORAL | Status: DC
  Administered 2011-09-22 – 2011-09-25 (×4): 10 mg via ORAL
  Filled 2011-09-22 (×4): qty 1

## 2011-09-22 MED ORDER — ACETAMINOPHEN 325 MG PO TABS
650.0000 mg | ORAL_TABLET | ORAL | Status: DC | PRN
  Administered 2011-09-22 (×2): 650 mg via ORAL
  Filled 2011-09-22 (×2): qty 2

## 2011-09-22 MED ORDER — NITROGLYCERIN IN D5W 200-5 MCG/ML-% IV SOLN: 2.0000 ug/min | INTRAVENOUS | Status: DC

## 2011-09-22 MED ORDER — NITROGLYCERIN IN D5W 200-5 MCG/ML-% IV SOLN
10.0000 ug/min | Freq: Once | INTRAVENOUS | Status: AC
  Administered 2011-09-22: 10 ug/min via INTRAVENOUS
  Filled 2011-09-22: qty 250

## 2011-09-22 MED ORDER — SODIUM CHLORIDE 0.9 % IJ SOLN: 3.0000 mL | INTRAMUSCULAR | Status: DC | PRN

## 2011-09-22 MED ORDER — SERTRALINE HCL 100 MG PO TABS
100.0000 mg | ORAL_TABLET | Freq: Two times a day (BID) | ORAL | Status: DC
  Administered 2011-09-22 – 2011-09-23 (×3): 100 mg via ORAL
  Filled 2011-09-22 (×6): qty 1

## 2011-09-22 MED ORDER — ASPIRIN 81 MG PO CHEW
324.0000 mg | CHEWABLE_TABLET | Freq: Once | ORAL | Status: AC
  Administered 2011-09-22: 324 mg via ORAL
  Filled 2011-09-22: qty 4

## 2011-09-22 MED ORDER — ASPIRIN EC 81 MG PO TBEC
81.0000 mg | DELAYED_RELEASE_TABLET | Freq: Every day | ORAL | Status: DC
  Administered 2011-09-23 – 2011-09-25 (×3): 81 mg via ORAL
  Filled 2011-09-22 (×4): qty 1

## 2011-09-22 MED ORDER — QUETIAPINE FUMARATE 50 MG PO TABS
250.0000 mg | ORAL_TABLET | Freq: Every day | ORAL | Status: DC
  Administered 2011-09-22 – 2011-09-24 (×3): 250 mg via ORAL
  Filled 2011-09-22 (×4): qty 1

## 2011-09-22 MED ORDER — METFORMIN HCL 500 MG PO TABS
1000.0000 mg | ORAL_TABLET | Freq: Two times a day (BID) | ORAL | Status: DC
  Administered 2011-09-22 – 2011-09-24 (×5): 1000 mg via ORAL
  Administered 2011-09-25 (×2): 500 mg via ORAL
  Filled 2011-09-22 (×9): qty 2

## 2011-09-22 MED ORDER — FUROSEMIDE 10 MG/ML IJ SOLN: 40.0000 mg | Freq: Two times a day (BID) | INTRAMUSCULAR | Status: DC

## 2011-09-22 MED ORDER — INSULIN ASPART 100 UNIT/ML ~~LOC~~ SOLN
3.0000 [IU] | Freq: Three times a day (TID) | SUBCUTANEOUS | Status: DC
  Administered 2011-09-23: 3 [IU] via SUBCUTANEOUS

## 2011-09-22 MED ORDER — ENOXAPARIN SODIUM 40 MG/0.4ML ~~LOC~~ SOLN
40.0000 mg | SUBCUTANEOUS | Status: DC
  Administered 2011-09-23 – 2011-09-24 (×2): 40 mg via SUBCUTANEOUS
  Filled 2011-09-22 (×5): qty 0.4

## 2011-09-22 MED ORDER — SODIUM CHLORIDE 0.9 % IV SOLN: 250.0000 mL | INTRAVENOUS | Status: DC | PRN

## 2011-09-22 NOTE — ED Notes (Signed)
[id number] Ready 

## 2011-09-22 NOTE — ED Notes (Signed)
Report called to 2900, Room still not ready for pt transport curtain being changed during report told to wait to bring pt in about .

## 2011-09-22 NOTE — ED Provider Notes (Addendum)
History     CSN: [id number]  Arrival date & time 09/22/11  4098   First MD Initiated Contact with Patient 09/22/11 [phone]      Chief Complaint  Patient presents with  . Congestive Heart Failure    (Consider location/radiation/quality/duration/timing/severity/associated sxs/prior treatment) HPI Complains of intermittent chest pain and shortness of breath constant for the past 3 days feels like congestive heart failure he's had in the past is chest pain is pleuritic and worse with lying supine, improved with sitting upright. dyspnea is also worse with lying supine. Improved with sitting upright He denies chest pain presently. He admits to noncompliance with Lasix. Denies other complaint. No treatment prior to coming here Past Medical History  Diagnosis Date  . Hypertension   . Hyperlipidemia   . Depression   . OSA (obstructive sleep apnea)     On CPAP; NON-COMPLIANT  . CVA (cerebral infarction)     No residual deficits  . Non-ischemic cardiomyopathy     No ischemia on myoview, showed EF of 42%. 2D echo showed EF 50-55% with diatolic dysfunction in 2009  . Post-cardiac injury syndrome     History of cardiac injury from blunt trauma  . PVCs (premature ventricular contractions)     Followed by [facility].   [name] Kitchen History of colonic polyps   . Obesity   . Syncope     Recurrent, thought to be vasovagal. Also has h/o frequent PVCs.   . CHF (congestive heart failure) 11/2010    ACUTE SYSTOLIC CHF, THIS IS A NEW DX  . Diabetes mellitus     TYPE II; UNCONTROLLED BY HEMOGLOBIN A1c; STABLE AS  PER DISCHARGE  . SOB (shortness of breath)   . Stroke   . Herpes simplex of male genitalia     Past Surgical History  Procedure Date  . Cardiac catheterization 12/19/10    DIFFUSE NONOBSTRUCTIVE CAD; NONISCHEMIC CARDIOMYOPATHY; LEFT VENTRICULAR ANGIOGRAM WAS PERFORMED SECONDARY TO  ELEVATED LEFT VENTRICULAR FILLING PRESSURES    Family History  Problem Relation Age of Onset  . Heart  disease Mother     MI  . Heart failure Mother   . Diabetes Mother     ALSO IN MOST OF HIS SIBLINGS; 2 UNLCES HAVE ALSO PASSED AWAY FROM DM  . Cardiomyopathy Mother     History  Substance Use Topics  . Smoking status: Never Smoker   . Smokeless tobacco: Not on file  . Alcohol Use: No      Review of Systems  Constitutional: Negative.   HENT: Negative.   Respiratory: Positive for shortness of breath.   Cardiovascular: Positive for chest pain.  Gastrointestinal: Negative.   Musculoskeletal: Negative.   Skin: Negative.   Neurological: Negative.   Hematological: Negative.   Psychiatric/Behavioral: Negative.   All other systems reviewed and are negative.    Allergies  Nsaids  Home Medications   Current Outpatient Rx  Name Route Sig Dispense Refill  . VENTOLIN IN  Inhale 2 puffs every 4 hours as needed for shortness of breath/wheezing.    . ASPIRIN 81 MG PO TBEC Oral Take 81 mg by mouth daily.     [name] Kitchen HYDROCOD POLST-CPM POLST ER 10-8 MG/5ML PO LQCR Oral Take 5 mLs by mouth every 12 (twelve) hours as needed.     [name] Kitchen CLINDAMYCIN HCL 300 MG PO CAPS Oral Take 300 mg by mouth every 6 (six) hours. Take for 7 days.    [name] Kitchen DOXYCYCLINE HYCLATE 100 MG PO TBEC Oral Take 100 mg  by mouth 2 (two) times daily. Take for 7 days    . FUROSEMIDE 40 MG PO TABS Oral Take 40 mg by mouth daily.     [name] Kitchen GLIPIZIDE 5 MG PO TABS Oral Take 5 mg by mouth every morning.     . IBUPROFEN 200 MG PO TABS Oral Take 400 mg by mouth every 6 (six) hours as needed. As needed for rest     . INSULIN ASPART PROT & ASPART (70-30) 100 UNIT/ML [place] SUSP  Inject 10 units under the skin twice daily before breakfast and dinner.    [name] Kitchen LISINOPRIL 10 MG PO TABS Oral Take 10 mg by mouth daily.     [name] Kitchen METOPROLOL TARTRATE 25 MG PO TABS Oral Take 25 mg by mouth 2 (two) times daily.     [name] Kitchen POTASSIUM CHLORIDE CRYS ER 20 MEQ PO TBCR Oral Take 40 mEq by mouth daily.     [name] Kitchen PRAVASTATIN SODIUM 40 MG PO TABS Oral Take 40 mg by mouth daily.     .  QUETIAPINE FUMARATE 100 MG PO TABS Oral Take 600 mg by mouth at bedtime. Take 3 tablets by mouth daily at bedtime.    . SERTRALINE HCL 100 MG PO TABS Oral Take 100 mg by mouth 2 (two) times daily.       BP 160/123  Pulse 118  Temp 98.3 F (36.8 C)  Resp 22  Ht 6' (1.829 m)  Wt 282 lb (127.914 kg)  BMI 38.25 kg/m2  SpO2 99%  Physical Exam  Nursing note and vitals reviewed. Constitutional: He appears well-developed and well-nourished.  HENT:  Head: Normocephalic and atraumatic.  Eyes: Conjunctivae are normal. Pupils are equal, round, and reactive to light.  Neck: Neck supple. JVD present. No tracheal deviation present. No thyromegaly present.       No bruit  Cardiovascular: Regular rhythm and intact distal pulses.   No murmur heard.      Tachycardic  Pulmonary/Chest: Effort normal and breath sounds normal.  Abdominal: Soft. Bowel sounds are normal. He exhibits no distension. There is no tenderness.  Musculoskeletal: Normal range of motion. He exhibits no edema and no tenderness.  Neurological: He is alert. Coordination normal.  Skin: Skin is warm and dry. No rash noted.  Psychiatric: He has a normal mood and affect.    ED Course  Procedures (including critical care time)  Date: 09/22/2011  Rate: 120  Rhythm: sinus tachycardia  QRS Axis: left  Intervals: normal  ST/T Wave abnormalities: nonspecific ST changes  Conduction Disutrbances:none  Narrative Interpretation:   Old EKG Reviewed: unchanged Unchanged from 03/22/2011  Labs Reviewed - No data to display No results found.   No diagnosis found. Results for orders placed during the hospital encounter of 09/22/11  CBC      Component Value Range   WBC 9.2  4.0 - 10.5 (K/uL)   RBC 4.39  4.22 - 5.81 (MIL/uL)   Hemoglobin 13.1  13.0 - 17.0 (g/dL)   HCT 19.1 (*) 47.8 - 52.0 (%)   MCV 85.6  78.0 - 100.0 (fL)   MCH 29.8  26.0 - 34.0 (pg)   MCHC 34.8  30.0 - 36.0 (g/dL)   RDW 29.5  62.1 - 30.8 (%)   Platelets 184  150  - 400 (K/uL)  D-DIMER, QUANTITATIVE      Component Value Range   D-Dimer, Quant 0.46  0.00 - 0.48 (ug/mL-FEU)  POCT I-STAT, CHEM 8      Component Value Range  Sodium 140  135 - 145 (mEq/L)   Potassium 4.2  3.5 - 5.1 (mEq/L)   Chloride 104  96 - 112 (mEq/L)   BUN 9  6 - 23 (mg/dL)   Creatinine, Ser 1.61  0.50 - 1.35 (mg/dL)   Glucose, Bld 096 (*) 70 - 99 (mg/dL)   Calcium, Ion 0.45  4.09 - 1.32 (mmol/L)   TCO2 25  0 - 100 (mmol/L)   Hemoglobin 13.9  13.0 - 17.0 (g/dL)   HCT 81.1  91.4 - 78.2 (%)  POCT I-STAT TROPONIN I      Component Value Range   Troponin i, poc 0.00  0.00 - 0.08 (ng/mL)   Comment 3            Dg Chest Port 1 View  09/22/2011  *RADIOLOGY REPORT*  Clinical Data: Chest pain. Shortness of breath.  High blood pressure.  Diabetic.  PORTABLE CHEST - 1 VIEW  Comparison: 03/22/2011.  Findings: Cardiomegaly.  Pulmonary edema.  Right base infiltrate not excluded.  No gross pneumothorax.  Mildly tortuous aorta.  IMPRESSION: Cardiomegaly.  Pulmonary edema.  Right base infiltrate not excluded.  Mildly tortuous aorta.  Original Report Authenticated By: Fuller Canada, M.D.      MDM  Intravenous nitroglycerin and Lasix ordered by me along with subcutaneous insulin [place] cardiology service called to evaluate patient Tightness #1 congestive heart failure #2 hyperglycemia #3 medication noncompliance        Doug Sou, MD 09/22/11 1120  Doug Sou, MD 09/22/11 1233

## 2011-09-22 NOTE — ED Notes (Signed)
Pt brought back from triage. Pt in in gown with cardiac monitor, pulse ox, and blood pressure cuff on.

## 2011-09-22 NOTE — ED Notes (Signed)
Ekg done and old ekg found in [facility] and given to Dr. Maryann Conners.

## 2011-09-22 NOTE — ED Notes (Signed)
Attempted report x 1, room is still in the process of being cleaned, Diplomatic Services operational officer given number.

## 2011-09-22 NOTE — ED Notes (Signed)
Flow manager made aware that bed has been changed to step-down. Pt remains on monitor. Pt given PO fluids and will have secretary order a meal tray.

## 2011-09-22 NOTE — ED Notes (Signed)
Pt. Has a hx of  CHF and is having chest pain and sob.

## 2011-09-22 NOTE — ED Notes (Signed)
Printed two old ekgs from muse. 

## 2011-09-22 NOTE — ED Notes (Addendum)
Room has still not been cleaned, pt is however going to this room. Will let on coming staff know that room is not ready. Secretary on 2900 states house cleaning is aware room is a stat clean.

## 2011-09-22 NOTE — ED Notes (Signed)
Cardiology at bedside.

## 2011-09-22 NOTE — ED Notes (Signed)
CBG 270 notified RN

## 2011-09-22 NOTE — H&P (Signed)
Advanced Heart Failure Team H &  Note  Referring Physician:  Primary Physician: None  Primary Cardiologist: Dr Gala Romney   Reason for Admit:  Acute /Chronic Systolic Heart Failure   HPI:   Mitchell [name] is a 41 year old male with multiple medical problems including obesity, uncontrolled hypertension, uncontrolled diabetes, obstructive sleep apnea, bipolar disease/schizophrenia, CVA,  and congestive heart failure secondary to nonischemic cardiomyopathy. Diagnosed with HTN at age 25.   11/2010 LHC  1. Diffuse nonobstructive coronary artery disease.  2. Nonischemic cardiomyopathy.  Discharged from [facility] August 2012 for systolic heart failure. Discharge weight 270 pounds.  He was to follow up at Heart failure Clinic however he did not follow up. He is currently not followed by primary care physician. Non compliant with medications and he does run out of his medications.  Lives at home with his wife and two children. He checks glucose 2 times a week. Blood sugar has been >300 at home. He can only afford oral agents. He is followed by [facility] for bipolar disorder. Complains sexual dysfunction. 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   He has had progressive dyspnea over the last week.. Denies lower extremity edema. He does have CPAP at home but has been unable to use it due to dysnea.  Last weight at home 282 pounds.  Does not weigh at home. Dyspnea at rest. + PND + Orthopnea  .  Occasional pressure in chest.      Review of Systems: [y] = yes, [ ]  = no   General: Weight gain [ Y]; Weight loss [ ] ; Anorexia [ ] ; Fatigue [Y ]; Fever [ ] ; Chills [ ] ; Weakness [ ]   Cardiac: Chest pain/pressure [ ] ; Resting SOB [Y ]; Exertional SOB [Y ]; Orthopnea [ Y]; Pedal Edema [ ] ; Palpitations [ ] ; Syncope [ ] ; Presyncope [ ] ; Paroxysmal nocturnal dyspnea[ Y]  Pulmonary: Cough [ Y]; Wheezing[ ] ; Hemoptysis[ ] ; Sputum [ ] ; Snoring [Y  ]  GI: Vomiting[ ] ; Dysphagia[ ] ; Melena[ ] ; Hematochezia [ ] ; Heartburn[ ] ; Abdominal pain [ ] ; Constipation [ ] ; Diarrhea [ ] ; BRBPR [ ]   GU: Hematuria[ ] ; Dysuria [ ] ; Nocturia[ ]   Vascular: Pain in legs with walking [ ] ; Pain in feet with lying flat [ ] ; Non-healing sores [ ] ; Stroke [ ] ; TIA [ ] ; Slurred speech [ ] ;  Neuro: Headaches[ ] ; Vertigo[ ] ; Seizures[ ] ; Paresthesias[ ] ;Blurred vision [ ] ; Diplopia [ ] ; Vision changes [ ]   Ortho/Skin: Arthritis [ ] ; Joint pain [ ] ; Muscle pain [ ] ; Joint swelling [ ] ; Back Pain [ ] ; Rash [ ]   Psych: Depression[Y ]; Anxiety[ Y]  Heme: Bleeding problems [ ] ; Clotting disorders [ ] ; Anemia [ ]   Endocrine: Diabetes [Y ]; Thyroid dysfunction[ ]   Home Medications Prior to Admission medications   Medication Sig Start Date End Date Taking? Authorizing Provider  aspirin 81 MG EC tablet Take 81 mg by mouth daily.    Yes Historical Provider, MD  chlorpheniramine-HYDROcodone (TUSSIONEX PENNKINETIC ER) 10-8 MG/5ML LQCR Take 5 mLs by mouth every 12 (twelve) hours as needed.    Yes Historical Provider, MD  furosemide (LASIX) 40 MG tablet Take 40 mg by mouth daily.    Yes Historical Provider, MD  glipiZIDE (GLUCOTROL XL) 10 MG 24 hr tablet Take 10 mg by mouth daily.   Yes Historical Provider, MD  hydrOXYzine (VISTARIL) 25 MG capsule Take 25-50 mg by mouth  3 (three) times daily as needed. For sleep   Yes Historical Provider, MD  lisinopril (PRINIVIL,ZESTRIL) 10 MG tablet Take 10 mg by mouth daily.    Yes Historical Provider, MD  metFORMIN (GLUCOPHAGE) 1000 MG tablet Take 1,000 mg by mouth 2 (two) times daily with a meal.   Yes Historical Provider, MD  potassium chloride SA (K-DUR,KLOR-CON) 20 MEQ tablet Take 40 mEq by mouth daily.    Yes Historical Provider, MD  QUEtiapine (SEROQUEL) 100 MG tablet Take 250 mg by mouth at bedtime.    Yes Historical Provider, MD  sertraline (ZOLOFT) 100 MG tablet Take 100 mg by mouth 2 (two) times daily.    Yes Historical Provider,  MD  pravastatin (PRAVACHOL) 40 MG tablet Take 40 mg by mouth daily.     Historical Provider, MD    Past Medical History: Past Medical History  Diagnosis Date  . Hypertension   . Hyperlipidemia   . Depression   . OSA (obstructive sleep apnea)     On CPAP; NON-COMPLIANT  . CVA (cerebral infarction)     No residual deficits  . Non-ischemic cardiomyopathy     No ischemia on myoview, showed EF of 42%. 2D echo showed EF 50-55% with diatolic dysfunction in 2009  . Post-cardiac injury syndrome     History of cardiac injury from blunt trauma  . PVCs (premature ventricular contractions)     Followed by [facility].   [name] Kitchen History of colonic polyps   . Obesity   . Syncope     Recurrent, thought to be vasovagal. Also has h/o frequent PVCs.   . CHF (congestive heart failure) 11/2010    ACUTE SYSTOLIC CHF, THIS IS A NEW DX  . Diabetes mellitus     TYPE II; UNCONTROLLED BY HEMOGLOBIN A1c; STABLE AS  PER DISCHARGE  . SOB (shortness of breath)   . Stroke   . Herpes simplex of male genitalia     Past Surgical History: Past Surgical History  Procedure Date  . Cardiac catheterization 12/19/10    DIFFUSE NONOBSTRUCTIVE CAD; NONISCHEMIC CARDIOMYOPATHY; LEFT VENTRICULAR ANGIOGRAM WAS PERFORMED SECONDARY TO  ELEVATED LEFT VENTRICULAR FILLING PRESSURES    Family History: Family History  Problem Relation Age of Onset  . Heart disease Mother     MI  . Heart failure Mother   . Diabetes Mother     ALSO IN MOST OF HIS SIBLINGS; 2 UNLCES HAVE ALSO PASSED AWAY FROM DM  . Cardiomyopathy Mother     Social History: History   Social History  . Marital Status: Married    Spouse Name: N/A    Number of Children: N/A  . Years of Education: N/A   Social History Main Topics  . Smoking status: Never Smoker   . Smokeless tobacco: None  . Alcohol Use: No  . Drug Use: No  . Sexually Active:    Other Topics Concern  . 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    Allergies:  Allergies  Allergen Reactions  . Nsaids Hives    Objective:    Vital Signs:   Temp:  [98.3 F (36.8 C)] 98.3 F (36.8 C) (02/22 0951) Pulse Rate:  [112-122] 115  (02/22 1143) Resp:  [20-28] 20  (02/22 1143) BP: (146-160)/(105-123) 146/107 mmHg (02/22 1143) SpO2:  [99 %-100 %] 100 % (  02/22 1143) Weight:  [127.914 kg (282 lb)] 127.914 kg (282 lb) (02/22 0950)    24-hour weight change: Weight change:   Intake/Output:  No intake or output data in the 24 hours ending 09/22/11 1213   Physical Exam: General:  Well appearing. No resp difficulty HEENT: normal Neck: supple. JVP to jaw Carotids 2+ bilat; no bruits. No lymphadenopathy or thryomegaly appreciated. Cor: PMI nondisplaced. Regular rate & rhythm. No rubs, S3  or murmurs. Lungs: clear Abdomen: soft, obese, nontender, nondistended. No hepatosplenomegaly. No bruits or masses. Good bowel sounds. Extremities: no cyanosis, clubbing, rash, edema Neuro: alert & orientedx3, cranial nerves grossly intact. moves all 4 extremities w/o difficulty. Affect pleasant  Telemetry: Sinus Tachycardia 115-120  Labs: Basic Metabolic Panel:  Lab 09/22/11 4540  NA 140  K 4.2  CL 104  CO2 --  GLUCOSE 432*  BUN 9  CREATININE 0.50  CALCIUM --  MG --  PHOS --    Liver Function Tests: No results found for this basename: AST:5,ALT:5,ALKPHOS:5,BILITOT:5,PROT:5,ALBUMIN:5 in the last 168 hours No results found for this basename: LIPASE:5,AMYLASE:5 in the last 168 hours No results found for this basename: AMMONIA:3 in the last 168 hours  CBC:  Lab 09/22/11 1037 09/22/11 1025  WBC -- 9.2  NEUTROABS -- --  HGB 13.9 13.1  HCT 41.0 37.6*  MCV -- 85.6  PLT -- 184    Cardiac Enzymes: No results found for this basename: CKTOTAL:5,CKMB:5,CKMBINDEX:5,TROPONINI:5 in the last 168 hours  BNP: No components found with  this basename: POCBNP:5  CBG: No results found for this basename: GLUCAP:5 in the last 168 hours  Coagulation Studies: No results found for this basename: LABPROT:5,INR:5 in the last 72 hours  Other results: EKG: Sinus Tachycardia Imaging: Dg Chest Port 1 View  09/22/2011  *RADIOLOGY REPORT*  Clinical Data: Chest pain. Shortness of breath.  High blood pressure.  Diabetic.  PORTABLE CHEST - 1 VIEW  Comparison: 03/22/2011.  Findings: Cardiomegaly.  Pulmonary edema.  Right base infiltrate not excluded.  No gross pneumothorax.  Mildly tortuous aorta.  IMPRESSION: Cardiomegaly.  Pulmonary edema.  Right base infiltrate not excluded.  Mildly tortuous aorta.  Original Report Authenticated By: Fuller Canada, M.D.      Medications:     Current Medications:    . aspirin  324 mg Oral Once  . aspirin  81 mg Oral Daily  . enoxaparin  40 mg Subcutaneous Q24H  . furosemide  40 mg Intravenous Once  . furosemide  40 mg Intravenous BID  . glipiZIDE  10 mg Oral Daily  . insulin aspart  12 Units Subcutaneous Once  . lisinopril  10 mg Oral Daily  . metFORMIN  1,000 mg Oral BID WC  . nitroGLYCERIN  10 mcg/min Intravenous Once  . QUEtiapine  250 mg Oral QHS  . sertraline  100 mg Oral BID  . simvastatin  20 mg Oral q1800  . sodium chloride  3 mL Intravenous Q12H    Infusions:      Assessment:  1. A/C systolic heart failure 25-30%.  2. Nonischemic cardiomyopathy with ejection fraction of 25-30%.  3. Diabetes, uncontrolled.  4. Hypertension, uncontrolled.  5. Obstructive sleep apnea.  6. Bipolar/schizophrenia.  7. Medical noncompliance.     Plan/Discussion:   Mitchell [name] is a 41 year old with NYHA class 4 symptoms.Volume status is elevated.  Based on weight from last discharge (270 pounds) weight is 10-12 pounds up. Will continue give Lasix 40 mg IV  Q 8 hours. Continue Nitroglycerin  drip and titrate up to maximum 150 mcg to reduce SBP to 120 for afterload reduction. Obtain BNP. Repeat  ECHO.  Re-initiate beta blocker as he improves.  Will not start Spironolactone or digoxin due to medical non-compliance however if he follows up in the Heart failure Clinic the could be re-addressed.   Will cycle cardiac enzymes due occasional chest pain.    Will check hemoglobin A1-C. Diabetic Coordinator Consult for recommendations and home regimen.  Will need tight glucose control.   Will admit to step down.for closer monitoring.   Will need SW/Care Manager assistance with medications and follow up for primary physician. This will be an ongoing problem if he does not have close follow up in Heart failure Clinic.     Length of Stay: 0   Amy Clegg NP-C 09/22/2011, 12:13 PM  Patient seen with NP, agree with note.  41 yo with history of DM and nonischemic cardiomyopathy presented with acute on chronic systolic CHF likely due to dietary indiscretion in the setting of medical noncompliance (has not seen a doctor since last discharge from [facility]).  1. CHF: Acute on chronic systolic, nonischemic cardiomyopathy.  He is very volume overloaded with JVP 14-16 cm.  He is hypertensive, currently on low-dose NTG gtt.   - Increase NTG gtt as tolerated by BP to as high as 150 mcg/min for afterload reduction.  - Lasix 40 mg IV q 8 hrs.  - Continue lisinopril 10 mg daily.  - Beta blocker when volume status improved. - Will plan to titrate up lisinopril and potentially add hydralazine/nitrates.  - Needs good plan for discharge regarding followup in clinic.  2. Diabetes: Hyperglycemia.  Treat with basal and short-acting insulin, diabetes care consult.   Marca Ancona 09/22/2011 2:23 PM

## 2011-09-23 DIAGNOSIS — I5023 Acute on chronic systolic (congestive) heart failure: Principal | ICD-10-CM

## 2011-09-23 DIAGNOSIS — I059 Rheumatic mitral valve disease, unspecified: Secondary | ICD-10-CM

## 2011-09-23 LAB — BASIC METABOLIC PANEL
BUN: 11 mg/dL (ref 6–23)
CO2: 27 mEq/L (ref 19–32)
Chloride: 104 mEq/L (ref 96–112)
Creatinine, Ser: 0.65 mg/dL (ref 0.50–1.35)
Glucose, Bld: 180 mg/dL — ABNORMAL HIGH (ref 70–99)
Potassium: 3.2 mEq/L — ABNORMAL LOW (ref 3.5–5.1)

## 2011-09-23 LAB — CARDIAC PANEL(CRET KIN+CKTOT+MB+TROPI)
Relative Index: 1.9 (ref 0.0–2.5)
Troponin I: <0.3 ng/mL (ref <0.30)

## 2011-09-23 LAB — GLUCOSE, CAPILLARY: Glucose-Capillary: 199 mg/dL — ABNORMAL HIGH (ref 70–99)

## 2011-09-23 MED ORDER — INSULIN GLARGINE 100 UNIT/ML ~~LOC~~ SOLN
14.0000 [IU] | Freq: Every day | SUBCUTANEOUS | Status: DC
  Administered 2011-09-23: 14 [IU] via SUBCUTANEOUS
  Filled 2011-09-23 (×2): qty 3

## 2011-09-23 MED ORDER — CARVEDILOL 3.125 MG PO TABS
3.1250 mg | ORAL_TABLET | Freq: Two times a day (BID) | ORAL | Status: DC
  Administered 2011-09-23 – 2011-09-24 (×3): 3.125 mg via ORAL
  Filled 2011-09-23 (×5): qty 1

## 2011-09-23 MED ORDER — INSULIN ASPART 100 UNIT/ML ~~LOC~~ SOLN
7.0000 [IU] | Freq: Three times a day (TID) | SUBCUTANEOUS | Status: DC
  Administered 2011-09-23 (×2): 7 [IU] via SUBCUTANEOUS
  Filled 2011-09-23: qty 3

## 2011-09-23 MED ORDER — FUROSEMIDE 80 MG PO TABS
80.0000 mg | ORAL_TABLET | Freq: Two times a day (BID) | ORAL | Status: DC
  Administered 2011-09-23 – 2011-09-25 (×5): 80 mg via ORAL
  Filled 2011-09-23 (×7): qty 1

## 2011-09-23 MED ORDER — POTASSIUM CHLORIDE CRYS ER 20 MEQ PO TBCR
40.0000 meq | EXTENDED_RELEASE_TABLET | Freq: Two times a day (BID) | ORAL | Status: AC
  Administered 2011-09-23 (×2): 40 meq via ORAL
  Filled 2011-09-23 (×2): qty 2

## 2011-09-23 NOTE — Progress Notes (Signed)
Subjective: HPI: Mitchell [name] is a 41 year old male with multiple medical problems including obesity, uncontrolled hypertension, uncontrolled diabetes, obstructive sleep apnea, bipolar disease/schizophrenia, CVA, and congestive heart failure secondary to nonischemic cardiomyopathy. Diagnosed with HTN at age 21.  11/2010 LHC  1. Diffuse nonobstructive coronary artery disease.  2. Nonischemic cardiomyopathy.   Discharged from [facility] August 2012 for systolic heart failure. Discharge weight 270 pounds. He was to follow up at Heart failure Clinic however he did not follow up. He is currently not followed by primary care physician. Non compliant with medications and he does run out of his medications. Lives at home with his wife and two children. He checks glucose 2 times a week. Blood sugar has been >300 at home. He can only afford oral agents. He is followed by [facility] for bipolar disorder. Complains sexual dysfunction. 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  He has had progressive dyspnea over the last week.. Denies lower extremity edema. He does have CPAP at home but has been unable to use it due to dysnea.  Last weight at home 282 pounds. Does not weigh at home. Dyspnea at rest. + PND + Orthopnea . Occasional pressure in chest.   Patient states that he feels much better and denies any SOB this morning.  He has received a total of 80mg  IV lasix yesterday and had a total urine output of 2.4L with a net negative 2L, his weight is down 5 pounds since yesterday.  Currently on Nitroglycerin for blood pressure control--afterload reduction  Objective: Vital signs in last 24 hours: Filed Vitals:   09/23/11 0300 09/23/11 0400 09/23/11 0500 09/23/11 0743  BP: 106/67 120/84  101/44  Pulse:    106  Temp:  98.5 F (36.9 C)  98.3 F (36.8 C)  TempSrc:  Oral  Oral  Resp:  20  18  Height:      Weight:   277 lb 12.5 oz (126 kg)    SpO2: 97% 96%  97%   Weight change:   Intake/Output Summary (Last 24 hours) at 09/23/11 0845 Last data filed at 09/23/11 0600  Gross per 24 hour  Intake    383 ml  Output   2425 ml  Net  -2042 ml   Physical Exam: General: alert, well-developed, and cooperative to examination.  Neck: supple, full ROM, no thyromegaly, JVD-flat, and no carotid bruits.  Lungs: normal respiratory effort, no accessory muscle use, normal breath sounds, no crackles, and no wheezes. Heart: normal rate, regular rhythm, no murmur, no gallop, and no rub.  Abdomen: soft, non-tender, normal bowel sounds, no distention, no guarding, no rebound tenderness Msk: no joint swelling, no joint warmth, and no redness over joints.  Pulses: 2+ DP/PT pulses bilaterally Extremities: No cyanosis, clubbing, edema Neurologic: nonfocal, moving all 4 extremities Skin: turgor normal and no rashes.  Psych: appropriate  Lab Results: Basic Metabolic Panel:  Lab 09/23/11 1610 09/22/11 1221 09/22/11 1037  NA 141 -- 140  K 3.2* -- 4.2  CL 104 -- 104  CO2 27 -- --  GLUCOSE 180* -- 432*  BUN 11 -- 9  CREATININE 0.65 0.65 --  CALCIUM 9.4 -- --  MG -- -- --  PHOS -- -- --   CBC:  Lab 09/22/11 1221 09/22/11 1037 09/22/11 1025  WBC 8.6 -- 9.2  NEUTROABS -- -- --  HGB 12.9* 13.9 --  HCT 36.9* 41.0 --  MCV 85.8 -- 85.6  PLT 170 -- 184   Cardiac Enzymes:  Lab 09/23/11 0500 09/22/11 2143 09/22/11 1341  CKTOTAL 139 170 188  CKMB 2.7 3.1 3.4  CKMBINDEX -- -- --  TROPONINI <0.30 <0.30 <0.30   BNP:  Lab 09/22/11 1155  PROBNP 434.7*   D-Dimer:  Lab 09/22/11 1025  DDIMER 0.46   CBG:  Lab 09/23/11 0745 09/22/11 2123 09/22/11 2003 09/22/11 1632 09/22/11 1417 09/22/11 1318  GLUCAP 199* 276* 297* 281* 270* 300*   Thyroid Function Tests:  Lab 09/22/11 1221  TSH 0.877  T4TOTAL --  FREET4 --  T3FREE --  THYROIDAB --    Drugs of Abuse     Component Value Date/Time   LABOPIA NONE DETECTED 06/08/2007 2132    COCAINSCRNUR NONE DETECTED 06/08/2007 2132   LABBENZ NONE DETECTED 06/08/2007 2132   AMPHETMU NONE DETECTED 06/08/2007 2132   THCU NONE DETECTED 06/08/2007 2132   LABBARB  Value: NONE DETECTED        DRUG SCREEN FOR MEDICAL PURPOSES ONLY.  IF CONFIRMATION IS NEEDED FOR ANY PURPOSE, NOTIFY LAB WITHIN 5 DAYS. 06/08/2007 2132    Micro Results: Recent Results (from the past 240 hour(s))  MRSA PCR SCREENING     Status: Normal   Collection Time   09/22/11  8:05 PM      Component Value Range Status Comment   MRSA by PCR NEGATIVE  NEGATIVE  Final    Studies/Results: Dg Chest Port 1 View  09/22/2011  *RADIOLOGY REPORT*  Clinical Data: Chest pain. Shortness of breath.  High blood pressure.  Diabetic.  PORTABLE CHEST - 1 VIEW  Comparison: 03/22/2011.  Findings: Cardiomegaly.  Pulmonary edema.  Right base infiltrate not excluded.  No gross pneumothorax.  Mildly tortuous aorta.  IMPRESSION: Cardiomegaly.  Pulmonary edema.  Right base infiltrate not excluded.  Mildly tortuous aorta.  Original Report Authenticated By: Fuller Canada, M.D.   Medications:  Scheduled Meds:   . aspirin  324 mg Oral Once  . aspirin EC  81 mg Oral Daily  . enoxaparin  40 mg Subcutaneous Q24H  . furosemide  40 mg Intravenous Once  . furosemide  40 mg Intravenous Q8H  . glipiZIDE  10 mg Oral Daily  . insulin aspart  0-20 Units Subcutaneous TID WC  . insulin aspart  0-5 Units Subcutaneous QHS  . insulin aspart  12 Units Subcutaneous Once  . insulin aspart  3 Units Subcutaneous TID WC  . insulin glargine  8 Units Subcutaneous Daily  . lisinopril  10 mg Oral Daily  . metFORMIN  1,000 mg Oral BID WC  . nitroGLYCERIN  10 mcg/min Intravenous Once  . QUEtiapine  250 mg Oral QHS  . sertraline  100 mg Oral BID  . simvastatin  20 mg Oral q1800  . sodium chloride  3 mL Intravenous Q12H  . DISCONTD: aspirin  81 mg Oral Daily  . DISCONTD: furosemide  40 mg Intravenous BID   Continuous Infusions:   . nitroGLYCERIN 10 mcg/min  (09/22/11 2200)   PRN Meds:.sodium chloride, acetaminophen, ondansetron (ZOFRAN) IV, sodium chloride Assessment/Plan:  1. Acute on chronic systolic heart failure: Nonischemic cardiomyopathy.  Improving overnight with IV Lasix with a urine output of 2.4L.  JVP is flat this morning.   -Will try to wean off NTG today -Increase Lisinopril to 20mg  po qd -May add Hydralazine/nitrates in AM -Switch to oral Lasix today 80 BID  2. DM type II: not well controlled. HbA1c pending  -Continue Metformin and Glipizide home dose -Increase  Lantus to 14 units qhs -Add meal time coverage with Novolog -D/C SSI  3. Bipolar disoder: stable, continue home meds  4. HLP: continue Simvastatin  LOS: 1 day   HO,MICHELE 09/23/2011, 8:45 AM   Attending note:  Patient seen and examined with the resident on rounds this morning. He states he feels much better, able to lie supine in bed. No chest pain.  On examination, BP 120/84, heart rate relatively tachycardic however in sinus rhythm. Lungs are essentially clear, cardiac exam with indistinct PMI, no obvious S3, abdomen protuberant. He has had a good diuresis, 2 L out more than yesterday. Weight coming down.  Lab work reviewed including potassium 3.2, creatinine 0.6, normal troponin levels.   Plan is to titrate his medications, specifically weaning him off nitroglycerin drip, adding Coreg 3.125 mg twice daily, continuing lisinopril, and converting to oral Lasix. Would like to try and keep his medical regimen as simple as possible with history of noncompliance and some financial constraints. Replete potassium. He is undergoing an echocardiogram today which will be reviewed.  Jonelle Sidle, M.D., F.A.C.C.

## 2011-09-23 NOTE — Progress Notes (Signed)
  Echocardiogram 2D Echocardiogram has been performed.  Amdrew Oboyle, Real Cons 09/23/2011, 9:39 AM

## 2011-09-24 DIAGNOSIS — E119 Type 2 diabetes mellitus without complications: Secondary | ICD-10-CM

## 2011-09-24 LAB — GLUCOSE, CAPILLARY
Glucose-Capillary: 264 mg/dL — ABNORMAL HIGH (ref 70–99)
Glucose-Capillary: 189 mg/dL — ABNORMAL HIGH (ref 70–99)

## 2011-09-24 LAB — BASIC METABOLIC PANEL
BUN: 13 mg/dL (ref 6–23)
Calcium: 9.1 mg/dL (ref 8.4–10.5)
GFR calc non Af Amer: >90 mL/min (ref >90)
Glucose, Bld: 182 mg/dL — ABNORMAL HIGH (ref 70–99)
Sodium: 141 mEq/L (ref 135–145)

## 2011-09-24 MED ORDER — INSULIN ASPART 100 UNIT/ML ~~LOC~~ SOLN
10.0000 [IU] | Freq: Three times a day (TID) | SUBCUTANEOUS | Status: DC
  Administered 2011-09-24 – 2011-09-25 (×2): 10 [IU] via SUBCUTANEOUS
  Filled 2011-09-24: qty 3

## 2011-09-24 MED ORDER — CARVEDILOL 6.25 MG PO TABS
6.2500 mg | ORAL_TABLET | Freq: Two times a day (BID) | ORAL | Status: DC
  Administered 2011-09-24 – 2011-09-25 (×2): 6.25 mg via ORAL
  Filled 2011-09-24 (×5): qty 1

## 2011-09-24 MED ORDER — SERTRALINE HCL 100 MG PO TABS
100.0000 mg | ORAL_TABLET | Freq: Every day | ORAL | Status: DC
  Administered 2011-09-25: 100 mg via ORAL
  Filled 2011-09-24 (×2): qty 1

## 2011-09-24 MED ORDER — INSULIN GLARGINE 100 UNIT/ML ~~LOC~~ SOLN
16.0000 [IU] | Freq: Every day | SUBCUTANEOUS | Status: DC
  Administered 2011-09-24: 16 [IU] via SUBCUTANEOUS
  Filled 2011-09-24: qty 3

## 2011-09-24 MED ORDER — SERTRALINE HCL 50 MG PO TABS
50.0000 mg | ORAL_TABLET | Freq: Every day | ORAL | Status: DC
  Administered 2011-09-24: 50 mg via ORAL
  Filled 2011-09-24 (×2): qty 1

## 2011-09-24 MED ORDER — SERTRALINE HCL 100 MG PO TABS: 100.0000 mg | ORAL_TABLET | Freq: Two times a day (BID) | ORAL | Status: DC

## 2011-09-24 NOTE — Progress Notes (Signed)
Subjective: HPI: Mr. Schertzer is a 41 year old male with multiple medical problems including obesity, uncontrolled hypertension, uncontrolled diabetes, obstructive sleep apnea, bipolar disease/schizophrenia, CVA, and congestive heart failure secondary to nonischemic cardiomyopathy. Diagnosed with HTN at age 10.  11/2010 LHC  1. Diffuse nonobstructive coronary artery disease.  2. Nonischemic cardiomyopathy.  Discharged from [facility] August 2012 for systolic heart failure. Discharge weight 270 pounds. He was to follow up at Heart failure Clinic however he did not follow up. He is currently not followed by primary care physician. Non compliant with medications and he does run out of his medications. Lives at home with his wife and two children. He checks glucose 2 times a week. Blood sugar has been >300 at home. He can only afford oral agents. He is followed by [facility] for bipolar disorder. Complains sexual dysfunction. 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  He has had progressive dyspnea over the last week.. Denies lower extremity edema. He does have CPAP at home but has been unable to use it due to dysnea.  Last weight at home 282 pounds. Does not weigh at home. Dyspnea at rest. + PND + Orthopnea . Occasional pressure in chest.   He feels great this morning, denies any chest pain or SOB.  He states that he normally take Zoloft 1 tab in AM and 1/2 tab at noon, but he was given one tablet BID yesterday along with Seroquel.  Low BPs in 87/58 were taken after Seroquel was given. His total urine output was 2.4L yesterday and net negative 2.2L.  His weight is down 3 pounds this morning which is almost at baseline (~270's).  2D echo on 09/23/11 showed EF 15-20%, mild to moderate mitral valve regurgitation.     Objective: Vital signs in last 24 hours: Filed Vitals:   09/23/11 2345 09/24/11 0335 09/24/11 0400 09/24/11 0526  BP:  94/53 87/58  122/74  Pulse:      Temp: 98.6 F (37 C) 97.6 F (36.4 C)    TempSrc: Oral Oral    Resp:      Height:      Weight:   273 lb 9.5 oz (124.1 kg)   SpO2: 98% 98%     Weight change: -8 lb 6.6 oz (-3.814 kg)  Intake/Output Summary (Last 24 hours) at 09/24/11 0740 Last data filed at 09/23/11 2000  Gross per 24 hour  Intake  246.8 ml  Output   2450 ml  Net -2203.2 ml   Physical Exam: General: alert, well-developed, and cooperative to examination.  Neck: supple, full ROM, no thyromegaly, JVD-flat, and no carotid bruits.  Lungs: normal respiratory effort, no accessory muscle use, normal breath sounds, no crackles, and no wheezes. Heart: normal rate, regular rhythm, no murmur, no gallop, and no rub.  Abdomen: soft, protuberant, non-tender, normal bowel sounds, no distention, no guarding, no rebound tenderness  Msk: no joint swelling, no joint warmth, and no redness over joints.  Pulses: 2+ DP/PT pulses bilaterally Extremities: No cyanosis, clubbing, edema Neurologic: nonfocal, moving all 4 extremities  Skin: turgor normal and no rashes.  Psych: appropriate  Lab Results: Basic Metabolic Panel:  Lab 09/24/11 4098 09/23/11 0500  NA 141 141  K 3.6 3.2*  CL 103 104  CO2 28 27  GLUCOSE 182* 180*  BUN 13 11  CREATININE 0.87 0.65  CALCIUM 9.1 9.4  MG -- --  PHOS -- --   Liver Function Tests: No  results found for this basename: AST:2,ALT:2,ALKPHOS:2,BILITOT:2,PROT:2,ALBUMIN:2 in the last 168 hours No results found for this basename: LIPASE:2,AMYLASE:2 in the last 168 hours No results found for this basename: AMMONIA:2 in the last 168 hours CBC:  Lab 09/22/11 1221 09/22/11 1037 09/22/11 1025  WBC 8.6 -- 9.2  NEUTROABS -- -- --  HGB 12.9* 13.9 --  HCT 36.9* 41.0 --  MCV 85.8 -- 85.6  PLT 170 -- 184   Cardiac Enzymes:  Lab 09/23/11 0500 09/22/11 2143 09/22/11 1341  CKTOTAL 139 170 188  CKMB 2.7 3.1 3.4  CKMBINDEX -- -- --  TROPONINI <0.30 <0.30 <0.30    BNP:  Lab 09/22/11 1155  PROBNP 434.7*   D-Dimer:  Lab 09/22/11 1025  DDIMER 0.46   CBG:  Lab 09/23/11 2146 09/23/11 1717 09/23/11 1146 09/23/11 0745 09/22/11 2123 09/22/11 2003  GLUCAP 130* 227* 241* 199* 276* 297*   Thyroid Function Tests:  Lab 09/22/11 1221  TSH 0.877  T4TOTAL --  FREET4 --  T3FREE --  THYROIDAB --    Drugs of Abuse     Component Value Date/Time   LABOPIA NONE DETECTED 06/08/2007 2132   COCAINSCRNUR NONE DETECTED 06/08/2007 2132   LABBENZ NONE DETECTED 06/08/2007 2132   AMPHETMU NONE DETECTED 06/08/2007 2132   THCU NONE DETECTED 06/08/2007 2132   LABBARB  Value: NONE DETECTED        DRUG SCREEN FOR MEDICAL PURPOSES ONLY.  IF CONFIRMATION IS NEEDED FOR ANY PURPOSE, NOTIFY LAB WITHIN 5 DAYS. 06/08/2007 2132      Micro Results: Recent Results (from the past 240 hour(s))  MRSA PCR SCREENING     Status: Normal   Collection Time   09/22/11  8:05 PM      Component Value Range Status Comment   MRSA by PCR NEGATIVE  NEGATIVE  Final    Studies/Results: Dg Chest Port 1 View  09/22/2011  *RADIOLOGY REPORT*  Clinical Data: Chest pain. Shortness of breath.  High blood pressure.  Diabetic.  PORTABLE CHEST - 1 VIEW  Comparison: 03/22/2011.  Findings: Cardiomegaly.  Pulmonary edema.  Right base infiltrate not excluded.  No gross pneumothorax.  Mildly tortuous aorta.  IMPRESSION: Cardiomegaly.  Pulmonary edema.  Right base infiltrate not excluded.  Mildly tortuous aorta.  Original Report Authenticated By: Fuller Canada, M.D.   Medications:  Scheduled Meds:   . aspirin EC  81 mg Oral Daily  . carvedilol  3.125 mg Oral BID WC  . enoxaparin  40 mg Subcutaneous Q24H  . furosemide  80 mg Oral BID  . glipiZIDE  10 mg Oral Daily  . insulin aspart  0-5 Units Subcutaneous QHS  . insulin aspart  7 Units Subcutaneous TID WC  . insulin glargine  14 Units Subcutaneous Daily  . lisinopril  10 mg Oral Daily  . metFORMIN  1,000 mg Oral BID WC  . potassium chloride  40  mEq Oral BID  . QUEtiapine  250 mg Oral QHS  . sertraline  100 mg Oral BID  . simvastatin  20 mg Oral q1800  . sodium chloride  3 mL Intravenous Q12H  . DISCONTD: furosemide  40 mg Intravenous Q8H  . DISCONTD: insulin aspart  0-20 Units Subcutaneous TID WC  . DISCONTD: insulin aspart  3 Units Subcutaneous TID WC  . DISCONTD: insulin glargine  8 Units Subcutaneous Daily   Continuous Infusions:   . DISCONTD: nitroGLYCERIN 10 mcg/min (09/22/11 2200)   PRN Meds:.sodium chloride, acetaminophen, ondansetron (ZOFRAN) IV, sodium chloride Assessment/Plan: 1. Acute  on chronic systolic heart failure: Nonischemic cardiomyopathy. Improving overnight with PO Lasix 80mg  bid with a urine output of 2.4L. JVP remains flat this morning.  - Lisinopril 10mg  po qd  -continue Lasix 80 BID then may change to 80mg  QD in AM  -Increase Coreg to 6.25 po bid since his HR remains 110's  2. DM type II: CBGs better controlled, 130-227. HbA1c pending  -Continue Metformin and Glipizide home dose  -Increase Lantus to 16 units qhs  - increase meal time coverage with Novolog from 7 units to 10 units  3. Bipolar disoder: stable, continue home meds   4. HLP: continue Simvastatin  Dispo: telemetry today   LOS: 2 days   HO,MICHELE 09/24/2011, 7:40 AM   Attending note:  Patient seen and examined with resident. Clinically, he is doing much better, appetite is stable, no significant edema, and he is breathing comfortably.  Afebrile, blood pressure 107/69, heart rate 108 in sinus rhythm On examination, lungs are clear without labored breathing. Heart rate still somewhat elevated no definite S3, no pitting edema. Had 2200 cc out more than in last 24 hours. Weight down to 173 pounds.  Lab work shows potassium 3.6, creatinine 0.8. Followup echocardiogram shows LVEF of 15-20% with grade 1 diastolic dysfunction and mild to moderate mitral regurgitation.  Medications reviewed. He tolerated addition of Coreg, and volume  status is nearing baseline. Plan is to transfer him out to telemetry, anticipate cutting Lasix dose back tomorrow, possibly even discharge with close outpatient followup in the CHF clinic.  Jonelle Sidle, M.D., F.A.C.C.

## 2011-09-25 LAB — BASIC METABOLIC PANEL
GFR calc Af Amer: 62 mL/min — ABNORMAL LOW (ref >90)
GFR calc non Af Amer: 54 mL/min — ABNORMAL LOW (ref >90)
Potassium: 3.3 mEq/L — ABNORMAL LOW (ref 3.5–5.1)
Sodium: 142 mEq/L (ref 135–145)

## 2011-09-25 MED ORDER — INSULIN ASPART PROT & ASPART (70-30 MIX) 100 UNIT/ML ~~LOC~~ SUSP
10.0000 [IU] | Freq: Two times a day (BID) | SUBCUTANEOUS | Status: DC
  Filled 2011-09-25: qty 3

## 2011-09-25 MED ORDER — CARVEDILOL 6.25 MG PO TABS: 6.2500 mg | ORAL_TABLET | Freq: Two times a day (BID) | ORAL | Status: DC

## 2011-09-25 MED ORDER — DIGOXIN 250 MCG PO TABS: 250.0000 ug | ORAL_TABLET | Freq: Every day | ORAL | Status: DC

## 2011-09-25 MED ORDER — INSULIN ASPART 100 UNIT/ML ~~LOC~~ SOLN: 10.0000 [IU] | Freq: Two times a day (BID) | SUBCUTANEOUS | Status: DC

## 2011-09-25 MED ORDER — BD GETTING STARTED TAKE HOME KIT: 1/2ML X 30G SYRINGES
1.0000 | Freq: Once | Status: DC
  Filled 2011-09-25: qty 1

## 2011-09-25 MED ORDER — INSULIN ASPART PROT & ASPART (70-30 MIX) 100 UNIT/ML ~~LOC~~ SUSP: 10.0000 [IU] | Freq: Two times a day (BID) | SUBCUTANEOUS | Status: DC

## 2011-09-25 MED ORDER — INSULIN PEN STARTER KIT: 1.0000 | Freq: Once | Status: DC

## 2011-09-25 MED ORDER — FUROSEMIDE 40 MG PO TABS: 40.0000 mg | ORAL_TABLET | Freq: Every day | ORAL | Status: DC

## 2011-09-25 MED ORDER — INSULIN PEN STARTER KIT
1.0000 | Freq: Once | Status: DC
  Filled 2011-09-25: qty 1

## 2011-09-25 NOTE — Progress Notes (Signed)
Went over all follow up appts , med rec, and all d/c instructions, insulin pen and diabetic starter kit. Marisa Cyphers RN

## 2011-09-25 NOTE — Progress Notes (Signed)
Ordered Diabetic teaching kit from Pharmacy and called Diabetic coordinator as ordered by MD in consult. Marisa Cyphers RN

## 2011-09-25 NOTE — Discharge Summary (Signed)
Patient ID: MARSHA HILLMAN MRN: [id number] DOB/AGE: [birth year] 42 y.o.  Admit date: 09/22/2011 Discharge date: 09/25/2011  Primary Discharge Diagnosis 1. A/C systolic heart failure 25-30%.  2. Nonischemic cardiomyopathy with ejection fraction of 25-30%.  3. Diabetes, uncontrolled.  4. Hypertension, uncontrolled.  7. Medical noncompliance.   Secondary Discharge Diagnosis 1. Obstructive sleep apnea.  2. Bipolar/schizophrenia.   Significant Diagnostic Studies:  Echo: Left ventricle: mildly dilated, mild LVH. Estimated ejection fraction 15% to 20%. Diffuse hypokinesis with best contraction noted in lateral wall.  Doppler parameters are consistent with abnormal left ventricular relaxation (grade 1 diastolic dysfunction). Mitral valve: Mild to moderate regurgitation directed Eccentrically. Left atrium: moderately dilated.  Right ventricle: mildly reduced. A trivial pericardial effusion was identified posterior to the heart.  Hospital Course:  Mr. Maser is a 41 year old male with multiple medical problems including obesity, uncontrolled hypertension, uncontrolled diabetes, obstructive sleep apnea, bipolar disease/schizophrenia, CVA, nonobstructive coronary disease and systolic congestive heart failure secondary to nonischemic cardiomyopathy.  He has a history of medication noncompliance due to running out of his medications.  He has been unable to get refills due to not following up in clinic.  He also does not follow a low sodium diet.  He [facility] to [facility] with progressive dyspnea and 12 pound weight gain over the last week.  His SBP was in the 160s and he was started on a nitro gtt and given IV lasix with some improvement in his symptoms.    He was admitted to the Heart Failure service.  He was continued on IV lasix for a diuresis of ~4.5 liters.  His weight dropped down to 269 pounds.  Lasix was transitioned to po at this time.  He had a 2D echo checked with fall in LVEF to 15-20% with mild  to moderate mitral valve regurgitation.  With the diuresis he felt much better and was ambulating without difficulty.  His orthopnea and PND had improved.  Home medications were continued and his blood pressure was in much better control.   Digoxin was added for systolic heart failure with Class III symptoms.  On day of discharge he was felt stable for home by Dr. Gala Romney with close outpatient follow up.  The importance of follow up has been stressed and he agrees to come back to clinic.  Will have him set up for diabetes management with [facility].  We have provided him with a vial of novolog 70/30 from the [employer] [facility].     Discharge Info: Blood pressure 100/67, pulse 79, temperature 98.4 F (36.9 C), temperature source Oral, resp. rate 16, height 6' (1.829 m), weight 122.426 kg (269 lb 14.4 oz), SpO2 94.00%.   Weight change: -1.674 kg (-3 lb 11.1 oz) Results for orders placed during the hospital encounter of 09/22/11 (from the past 24 hour(s))  GLUCOSE, CAPILLARY     Status: Abnormal   Collection Time   09/24/11 11:48 AM      Component Value Range   Glucose-Capillary 264 (*) 70 - 99 (mg/dL)   Comment 1 Notify RN    GLUCOSE, CAPILLARY     Status: Abnormal   Collection Time   09/24/11  3:56 PM      Component Value Range   Glucose-Capillary 130 (*) 70 - 99 (mg/dL)  GLUCOSE, CAPILLARY     Status: Abnormal   Collection Time   09/24/11  9:04 PM      Component Value Range   Glucose-Capillary 189 (*) 70 - 99 (mg/dL)   Comment 1  Documented in Chart     Comment 2 Notify RN    BASIC METABOLIC PANEL     Status: Abnormal   Collection Time   09/25/11  5:30 AM      Component Value Range   Sodium 142  135 - 145 (mEq/L)   Potassium 3.3 (*) 3.5 - 5.1 (mEq/L)   Chloride 101  96 - 112 (mEq/L)   CO2 29  19 - 32 (mEq/L)   Glucose, Bld 121 (*) 70 - 99 (mg/dL)   BUN 18  6 - 23 (mg/dL)   Creatinine, Ser 8.29 (*) 0.50 - 1.35 (mg/dL)   Calcium 9.1  8.4 - 56.2 (mg/dL)   GFR calc non Af Amer 54 (*) >90  (mL/min)   GFR calc Af Amer 62 (*) >90 (mL/min)  GLUCOSE, CAPILLARY     Status: Abnormal   Collection Time   09/25/11  6:19 AM      Component Value Range   Glucose-Capillary 128 (*) 70 - 99 (mg/dL)   Comment 1 Notify RN     Comment 2 Documented in Chart       Discharge Medications: Medication List  As of 09/25/2011 10:18 AM   STOP taking these medications         TUSSIONEX PENNKINETIC ER 10-8 MG/5ML Lqcr      VENTOLIN IN         TAKE these medications         aspirin 81 MG EC tablet   Take 81 mg by mouth daily.      carvedilol 6.25 MG tablet   Commonly known as: COREG   Take 1 tablet (6.25 mg total) by mouth 2 (two) times daily with a meal.      digoxin 0.25 MG tablet   Commonly known as: LANOXIN   Take 1 tablet (250 mcg total) by mouth daily.      furosemide 40 MG tablet   Commonly known as: LASIX   Take 1 tablet (40 mg total) by mouth daily.      glipiZIDE 10 MG 24 hr tablet   Commonly known as: GLUCOTROL XL   Take 10 mg by mouth daily.      hydrOXYzine 25 MG capsule   Commonly known as: VISTARIL   Take 25-50 mg by mouth 3 (three) times daily as needed. For sleep      insulin aspart protamine-insulin aspart (70-30) 100 UNIT/ML injection   Commonly known as: NOVOLOG 70/30   Inject 10 Units into the skin 2 (two) times daily with a meal.      lisinopril 10 MG tablet   Commonly known as: PRINIVIL,ZESTRIL   Take 10 mg by mouth daily.      metFORMIN 1000 MG tablet   Commonly known as: GLUCOPHAGE   Take 1,000 mg by mouth 2 (two) times daily with a meal.      potassium chloride SA 20 MEQ tablet   Commonly known as: K-DUR,KLOR-CON   Take 40 mEq by mouth daily.      pravastatin 40 MG tablet   Commonly known as: PRAVACHOL   Take 40 mg by mouth daily.      QUEtiapine 100 MG tablet   Commonly known as: SEROQUEL   Take 250 mg by mouth at bedtime.      sertraline 100 MG tablet   Commonly known as: ZOLOFT   Take 100 mg by mouth 2 (two) times daily.  Follow-up Plans & Instructions: Discharge Orders    Future Appointments: Provider: Department: Dept Phone: Center:   10/02/2011 11:45 AM [facility] [phone] None     Future Orders Please Complete By Expires   Diet - low sodium heart healthy      Increase activity slowly      Heart Failure patients record your daily weight using the same scale at the same time of day      (HEART FAILURE PATIENTS) Call MD:  Anytime you have any of the following symptoms: 1) 3 pound weight gain in 24 hours or 5 pounds in 1 week 2) shortness of breath, with or without a dry hacking cough 3) swelling in the hands, feet or stomach 4) if you have to sleep on extra pillows at night in order to breathe.      ACE Inhibitor / ARB already ordered        Follow-up Information    Follow up with Arvilla Meres, MD on 10/02/2011. (@ 11:45 a    (Gate Code 6000))    Contact information:   [facility] [phone]       Follow up with [facility]. (March 25th at 10:45am for Eligibility Appointment)    Contact information:   [phone]      Follow up with [facility]EL[facility] EU[facility] on 11/06/2011. (April 8th at 10:00 for follow up with Dr. Venetia Night)           Layla Maw ALL MEDICATIONS WITH YOU TO FOLLOW UP APPOINTMENTS  Time spent with patient to include physician time: 40 minutes  Signed:  Robbi Garter, PA 09/25/2011, 10:18 AM  Patient seen and examined with Ulyess Blossom PA-C. We discussed all aspects of the encounter. I agree with the assessment and plan as stated above. See my note for earlier today for further details. Stable for d/c on current meds as outlined here. Will f/u in HF clinic next week.   Truman Hayward 9:58 PM

## 2011-09-25 NOTE — Plan of Care (Signed)
Problem: Discharge Progression Outcomes Goal: If EF < 40% ACEI/ARB addressed at discharge Outcome: Completed/Met Date Met:  09/25/11 Pt on Coreg

## 2011-09-25 NOTE — Progress Notes (Signed)
Called by RN to assist with this patient.  Pt to go home today.  Being d/c'd home on Novolog 70/30 insulin bid.  Has follow-up appt with [facility] in March.    Instructed pt on how to use insulin pen.  Pt was able to provide appropriate return demonstration.  Discussed prime of pen, how to give injection, disposal of needles, storage of pens, etc.    Will follow. Ambrose Finland RN, MSN, CDE Diabetes Coordinator Inpatient Diabetes Program [phone]

## 2011-09-25 NOTE — Plan of Care (Signed)
Problem: Phase I Progression Outcomes Goal: EF % per last Echo/documented,Core Reminder form on chart Outcome: Completed/Met Date Met:  09/25/11 09/23/11 EF 15 to 20 %

## 2011-09-25 NOTE — Plan of Care (Signed)
Problem: Phase II Progression Outcomes Goal: Case manager referral Outcome: Completed/Met Date Met:  09/25/11 Pt helped to get Insulin prior to D/C has financial hardship T Print production planner

## 2011-09-25 NOTE — Progress Notes (Signed)
Subjective: HPI: Mitchell [name] is a 41 year old male with multiple medical problems including obesity, uncontrolled hypertension, uncontrolled diabetes, obstructive sleep apnea, bipolar disease/schizophrenia, CVA, and congestive heart failure secondary to nonischemic cardiomyopathy. Diagnosed with HTN at age 72.  11/2010 LHC  1. Diffuse nonobstructive coronary artery disease.  2. Nonischemic cardiomyopathy.  Discharged from [facility] August 2012 for systolic heart failure. Discharge weight 270 pounds. He was to follow up at Heart failure Clinic however he did not follow up. He is currently not followed by primary care physician. Non compliant with medications and he does run out of his medications. Lives at home with his wife and two children. He checks glucose 2 times a week. Blood sugar has been >300 at home. He can only afford oral agents. He is followed by [facility] for bipolar disorder. Complains sexual dysfunction. 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  He has had progressive dyspnea over the last week.. Denies lower extremity edema. He does have CPAP at home but has been unable to use it due to dysnea.  Last weight at home 282 pounds. Does not weigh at home. Dyspnea at rest. + PND + Orthopnea . Occasional pressure in chest.   2D echo on 09/23/11 showed EF 15-20%, mild to moderate mitral valve regurgitation.  Feels great. Ambulating without CP/dyspnea. No edema, orthopnea or PND. BP stable.   Objective: Vital signs in last 24 hours: Filed Vitals:   09/24/11 1345 09/24/11 2244 09/25/11 0230 09/25/11 0548  BP: 114/74 115/78 117/73 100/67  Pulse: 106 109 102 79  Temp: 97.1 F (36.2 C) 99.4 F (37.4 C) 98.5 F (36.9 C) 98.4 F (36.9 C)  TempSrc: Oral Oral Oral Oral  Resp: 18 19 18 16   Height:      Weight:    122.426 kg (269 lb 14.4 oz)  SpO2: 99% 96% 98% 94%   Weight change: -1.674 kg (-3 lb 11.1  oz)  Intake/Output Summary (Last 24 hours) at 09/25/11 0746 Last data filed at 09/25/11 0504  Gross per 24 hour  Intake    322 ml  Output    475 ml  Net   -153 ml   Physical Exam: General: alert, well-developed, and cooperative to examination.  Neck: supple, full ROM, no thyromegaly, JVD-flat, and no carotid bruits.  Lungs: normal respiratory effort, no accessory muscle use, normal breath sounds, no crackles, and no wheezes. Heart: normal rate, regular rhythm, no murmur, no gallop, and no rub.  Abdomen: soft, protuberant, non-tender, normal bowel sounds, no distention, no guarding, no rebound tenderness  Msk: no joint swelling, no joint warmth, and no redness over joints.  Pulses: 2+ DP/PT pulses bilaterally Extremities: No cyanosis, clubbing, edema Neurologic: nonfocal, moving all 4 extremities  Skin: turgor normal and no rashes.  Psych: appropriate  Lab Results: Basic Metabolic Panel:  Lab 09/25/11 4540 09/24/11 0620  NA 142 141  K 3.3* 3.6  CL 101 103  CO2 29 28  GLUCOSE 121* 182*  BUN 18 13  CREATININE 1.54* 0.87  CALCIUM 9.1 9.1  MG -- --  PHOS -- --   Liver Function Tests: No results found for this basename: AST:2,ALT:2,ALKPHOS:2,BILITOT:2,PROT:2,ALBUMIN:2 in the last 168 hours No results found for this basename: LIPASE:2,AMYLASE:2 in the last 168 hours No results found for this basename: AMMONIA:2 in the last 168 hours CBC:  Lab 09/22/11 1221 09/22/11 1037 09/22/11 1025  WBC 8.6 -- 9.2  NEUTROABS -- -- --  HGB 12.9* 13.9 --  HCT 36.9* 41.0 --  MCV 85.8 -- 85.6  PLT 170 -- 184   Cardiac Enzymes:  Lab 09/23/11 0500 09/22/11 2143 09/22/11 1341  CKTOTAL 139 170 188  CKMB 2.7 3.1 3.4  CKMBINDEX -- -- --  TROPONINI <0.30 <0.30 <0.30   BNP:  Lab 09/22/11 1155  PROBNP 434.7*   D-Dimer:  Lab 09/22/11 1025  DDIMER 0.46   CBG:  Lab 09/25/11 0619 09/24/11 2104 09/24/11 1556 09/24/11 1148 09/24/11 0755 09/23/11 2146  GLUCAP 128* 189* 130* 264* 150*  130*   Thyroid Function Tests:  Lab 09/22/11 1221  TSH 0.877  T4TOTAL --  FREET4 --  T3FREE --  THYROIDAB --    Drugs of Abuse     Component Value Date/Time   LABOPIA NONE DETECTED 06/08/2007 2132   COCAINSCRNUR NONE DETECTED 06/08/2007 2132   LABBENZ NONE DETECTED 06/08/2007 2132   AMPHETMU NONE DETECTED 06/08/2007 2132   THCU NONE DETECTED 06/08/2007 2132   LABBARB  Value: NONE DETECTED        DRUG SCREEN FOR MEDICAL PURPOSES ONLY.  IF CONFIRMATION IS NEEDED FOR ANY PURPOSE, NOTIFY LAB WITHIN 5 DAYS. 06/08/2007 2132      Micro Results: Recent Results (from the past 240 hour(s))  MRSA PCR SCREENING     Status: Normal   Collection Time   09/22/11  8:05 PM      Component Value Range Status Comment   MRSA by PCR NEGATIVE  NEGATIVE  Final    Studies/Results: No results found. Medications:  Scheduled Meds:    . aspirin EC  81 mg Oral Daily  . carvedilol  6.25 mg Oral BID WC  . enoxaparin  40 mg Subcutaneous Q24H  . furosemide  80 mg Oral BID  . glipiZIDE  10 mg Oral Daily  . insulin aspart  0-5 Units Subcutaneous QHS  . insulin aspart  10 Units Subcutaneous TID WC  . insulin glargine  16 Units Subcutaneous Daily  . lisinopril  10 mg Oral Daily  . metFORMIN  1,000 mg Oral BID WC  . QUEtiapine  250 mg Oral QHS  . sertraline  100 mg Oral Q0600  . sertraline  50 mg Oral Q1200  . simvastatin  20 mg Oral q1800  . sodium chloride  3 mL Intravenous Q12H  . DISCONTD: carvedilol  3.125 mg Oral BID WC  . DISCONTD: insulin aspart  7 Units Subcutaneous TID WC  . DISCONTD: insulin glargine  14 Units Subcutaneous Daily  . DISCONTD: sertraline  100 mg Oral BID  . DISCONTD: sertraline  100 mg Oral BID   Continuous Infusions:  PRN Meds:.sodium chloride, acetaminophen, ondansetron (ZOFRAN) IV, sodium chloride Assessment/Plan: 1. Acute on chronic systolic heart failure: Nonischemic cardiomyopathy. Improving overnight with PO Lasix 80mg  bid with a urine output of 2.4L. JVP remains flat  this morning.    2. DM type II: CBGs better controlled, 130-227. HbA1c pending   3. Bipolar disoder: stable, continue home meds   4. HLP: continue Simvastatin  Attending:  Looks good today. On the dry side. Anxious to go home. Will send home today on previous dose lasix 40 daily. Can double to bid as needed. Add digoxin 0.25. Otherwise continue meds including metformin. Stressed need to f/u in clinic next week with labs.   Aysel Gilchrest,MD 7:50 AM

## 2011-09-25 NOTE — Progress Notes (Signed)
Pt D/C per W/C per Nurse tech with all belongings including cell phone and meds. Marisa Cyphers RN

## 2011-09-25 NOTE — Plan of Care (Signed)
Problem: Consults Goal: Heart Failure Patient Education (See Patient Education module for education specifics.)  Outcome: Adequate for Discharge Pt given HF booklet and went over 2 gmNa diet specificially pt seems somewhat aloof about booklet says he will read it more at homeTt Mitchell Noah RN Goal: Diabetes Guidelines if Diabetic/Glucose > 140 If diabetic or lab glucose is > 140 mg/dl - Initiate Diabetes/Hyperglycemia Guidelines & Document Interventions  Outcome: Completed/Met Date Met:  09/25/11 Diabetes coordinator met with pt prior to d/c Mitchell Treyvin Glidden RN  Problem: Discharge Progression Outcomes Goal: Barriers To Progression Addressed/Resolved Outcome: Completed/Met Date Met:  09/25/11 Pt able to inject self after teaching and meeting with Diabetes coordinator went home with new Diab kit.  Problem: Problem: Diabetes Management Progression Goal: INCREASE DIABETES KNOWLEDGE Outcome: Completed/Met Date Met:  09/25/11 Pt already Diabetic accessed pts level of knowledge and sick day plans Mitchell [name]

## 2011-09-25 NOTE — Progress Notes (Signed)
Aware pt is for discharge today. Strongly suggested pt watch HF video and some Diabetes videos. Pt declined. Given HF booklet went over low sodium diet. Pt seems somewhat disinterested about learning HF. Marisa Cyphers RN

## 2011-09-25 NOTE — Progress Notes (Signed)
   CARE MANAGEMENT NOTE HEART FAILURE  09/25/2011   Patient:  Mitchell [name], Mitchell [name]   Account Number:  [id number]    Date Initiated:  09/25/2011  Documentation initiated by:  Tera Mater  Subjective/Objective Assessment:   41yo male admitted with SOB.  HX:  CHF, DM, HTN, STROKE. Pt. lives with spouse. Has ben noncompliant with medications.  Pt. states he cannont afford his insulin.   Action/Plan:   Discharge planning for possible [facility] RN for HF management   Anticipated DC Date:  09/25/2011  Anticipated DC Plan:  HOME/SELF CARE  DC Planning Services:  CM consult  Medication Assistance    Choice offered to / List presented to:          Status of service:  Completed, signed off  Medicare Important Message Given:  NO (If response is "NO", the following Medicare IM given date fields will be blank) Date Medicare IM Given:   Date Additional Medicare IM Given:    Discharge Disposition:  HOME/SELF CARE  Per UR Regulation:  Reviewed for med. necessity/level of care/duration of stay  Comments:   09/25/11 0935 Received call from Candise Bowens, PharmD of pt. need for medication assistance and possible referral for [facility].  Pt. is unable to obtain his insulin.  This NCM called main pharmacy to inquire of indigent status and pt. will qualify for medications.  Made Nicki, PA aware of indigent status and she will write prescription for insulin for pt. to get bottle of insulin prior to discharge.  In addition, set pt. up with [facility] to be followed for his diabetes and to acquire an orange card for medications. Pt. first eligibility appointment is for March 25th at 10:45am and follow up with Dr. Venetia Night April 8th at 10:00am. Gave these appointment times to pt. and entered this information onto his discharge instructions.  Pt. will also be followed in the Heart Failure Clinic with Dr. Gala Romney. Pt. stated he is able to obtain all his medications on the $4 list at [facility] except his insulin. Tera Mater,  RN, BSN [phone]   Initial CM contact:  09/25/2011 09:30 AM  By:  Tera Mater Initial CSW contact:     By:      Is this an INP Readmission < 30 days:  N (If "YES" please see readmission information at the bottom of note)  Patient living status prior to this admission:  FAMILY  Patient setting prior to this admission:  HOME  Comorbid conditions being treated that contributed to this admission:  CHF, HTN, DM  CHF Readmission Risk:  high  Type of patient education provided  HF Patient Education Assessment / Teach Back  HF Zone Tool / Magnet  Limit salt intake  Weigh daily     Patient education provided by  [facility]    Was referral made to [facility]:  N  Is the patient's PCP the same as attending:  N PCP:    Readmission < 30 Days If pt has HH, did they contact the agency before going to the ED:   Name of [facility] agency:    Was the follow-up physician visit scheduled prior to discharge:    Did the patient follow-up with the physician prior to this readmission:    Was there HF Clinic visits prior to readmission:    Were there ED visits between admissions:    Readmit type:    If unscheduled and related indicate reason for readmit:

## 2011-10-02 ENCOUNTER — Ambulatory Visit (HOSPITAL_COMMUNITY)
Admit: 2011-10-02 | Discharge: 2011-10-02 | Disposition: A | Discharge status: Home / Self Care | Payer: Self-pay | Source: Ambulatory Visit | Attending: Internal Medicine | Admitting: Internal Medicine

## 2011-10-02 VITALS — BP 160/98 | HR 115 | Wt 280.8 lb

## 2011-10-02 DIAGNOSIS — F319 Bipolar disorder, unspecified: Secondary | ICD-10-CM | POA: Insufficient documentation

## 2011-10-02 DIAGNOSIS — I509 Heart failure, unspecified: Secondary | ICD-10-CM | POA: Insufficient documentation

## 2011-10-02 DIAGNOSIS — I5022 Chronic systolic (congestive) heart failure: Secondary | ICD-10-CM

## 2011-10-02 DIAGNOSIS — F209 Schizophrenia, unspecified: Secondary | ICD-10-CM | POA: Insufficient documentation

## 2011-10-02 DIAGNOSIS — Z7982 Long term (current) use of aspirin: Secondary | ICD-10-CM | POA: Insufficient documentation

## 2011-10-02 DIAGNOSIS — Z794 Long term (current) use of insulin: Secondary | ICD-10-CM | POA: Insufficient documentation

## 2011-10-02 DIAGNOSIS — G4733 Obstructive sleep apnea (adult) (pediatric): Secondary | ICD-10-CM | POA: Insufficient documentation

## 2011-10-02 DIAGNOSIS — Z860100 Personal history of colon polyps, unspecified: Secondary | ICD-10-CM | POA: Insufficient documentation

## 2011-10-02 DIAGNOSIS — IMO0001 Reserved for inherently not codable concepts without codable children: Secondary | ICD-10-CM | POA: Insufficient documentation

## 2011-10-02 DIAGNOSIS — I428 Other cardiomyopathies: Secondary | ICD-10-CM | POA: Insufficient documentation

## 2011-10-02 DIAGNOSIS — N529 Male erectile dysfunction, unspecified: Secondary | ICD-10-CM | POA: Insufficient documentation

## 2011-10-02 DIAGNOSIS — I10 Essential (primary) hypertension: Secondary | ICD-10-CM | POA: Insufficient documentation

## 2011-10-02 DIAGNOSIS — E669 Obesity, unspecified: Secondary | ICD-10-CM | POA: Insufficient documentation

## 2011-10-02 DIAGNOSIS — I4949 Other premature depolarization: Secondary | ICD-10-CM | POA: Insufficient documentation

## 2011-10-02 DIAGNOSIS — Z8673 Personal history of transient ischemic attack (TIA), and cerebral infarction without residual deficits: Secondary | ICD-10-CM | POA: Insufficient documentation

## 2011-10-02 MED ORDER — SILDENAFIL CITRATE 100 MG PO TABS: 100.0000 mg | ORAL_TABLET | Freq: Every day | ORAL | Status: DC | PRN

## 2011-10-02 MED ORDER — FUROSEMIDE 40 MG PO TABS: 40.0000 mg | ORAL_TABLET | Freq: Two times a day (BID) | ORAL | Status: DC

## 2011-10-02 NOTE — Patient Instructions (Signed)
Increase lasix 40 mg twice daily.  Follow up 2 weeks with labs.  Do the following things EVERYDAY: 1) Weigh yourself in the morning before breakfast. Write it down and keep it in a log. 2) Take your medicines as prescribed 3) Eat low salt foods--Limit salt (sodium) to 2000mg  per day.  4) Stay as active as you can everyday

## 2011-10-02 NOTE — Assessment & Plan Note (Signed)
As above, will restart coreg at 6.25 mg BID.  He is to continue lisinopril as well.  Will titrate as tolerated in 2 weeks.

## 2011-10-02 NOTE — Progress Notes (Signed)
HPI:   [name] is a 41 year old male with multiple medical problems including obesity, uncontrolled hypertension, uncontrolled diabetes, obstructive sleep apnea, bipolar disease/schizophrenia, CVA, nonobstructive coronary disease and systolic congestive heart failure secondary to nonischemic cardiomyopathy.  He has had several readmissions due to running out of his medications.  He recently was hospitalized for this problem with a 12 pound weight gain.  He was diuresed to 273 pounds  09/23/11: 2D echo checked with fall in LVEF to 15-20% with mild to moderate mitral valve regurgitation  Returns for follow up today.  He says he feels good.  He has not been weighing over the last daily, last weighed 283 on Saturday.  No shortness of breath/orthopnea/PND.  No chest pain.  He has been compliant with his medications he has at home but he has run out of coreg, pravachol, and digoxin.  (although the refills are at [facility]).        ROS: As per HPI otherwise negative  Past Medical History  Diagnosis Date  . Hypertension   . Hyperlipidemia   . Depression   . OSA (obstructive sleep apnea)     On CPAP; NON-COMPLIANT  . CVA (cerebral infarction)     No residual deficits  . Non-ischemic cardiomyopathy     No ischemia on myoview, showed EF of 42%. 2D echo showed EF 50-55% with diatolic dysfunction in 2009  . Post-cardiac injury syndrome     History of cardiac injury from blunt trauma  . PVCs (premature ventricular contractions)     Followed by [facility].   [name] Kitchen History of colonic polyps   . Obesity   . Syncope     Recurrent, thought to be vasovagal. Also has h/o frequent PVCs.   . CHF (congestive heart failure) 11/2010    ACUTE SYSTOLIC CHF, THIS IS A NEW DX  . SOB (shortness of breath)   . Stroke   . 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 Myocardial infarction   . Headache   . Diabetes mellitus     TYPE II; UNCONTROLLED BY HEMOGLOBIN A1c; STABLE AS  PER DISCHARGE    Current  Outpatient Prescriptions  Medication Sig Dispense Refill  . aspirin 81 MG EC tablet Take 81 mg by mouth daily.       . furosemide (LASIX) 40 MG tablet Take 1 tablet (40 mg total) by mouth daily.  45 tablet  6  . glipiZIDE (GLUCOTROL XL) 10 MG 24 hr tablet Take 10 mg by mouth daily.      . hydrOXYzine (VISTARIL) 25 MG capsule Take 25-50 mg by mouth 3 (three) times daily as needed. For sleep      . insulin aspart protamine-insulin aspart (NOVOLOG 70/30) (70-30) 100 UNIT/ML injection Inject 10 Units into the skin 2 (two) times daily with a meal.  3 mL  3  . lisinopril (PRINIVIL,ZESTRIL) 10 MG tablet Take 10 mg by mouth daily.       . metFORMIN (GLUCOPHAGE) 1000 MG tablet Take 1,000 mg by mouth 2 (two) times daily with a meal.      . potassium chloride SA (K-DUR,KLOR-CON) 20 MEQ tablet Take 20 mEq by mouth daily.       . QUEtiapine (SEROQUEL) 100 MG tablet Take 250 mg by mouth at bedtime.       . sertraline (ZOLOFT) 100 MG tablet Take 100 mg by mouth 2 (two) times daily.       . carvedilol (COREG) 6.25 MG tablet Take 1 tablet (6.25 mg  total) by mouth 2 (two) times daily with a meal.  60 tablet  6  . digoxin (LANOXIN) 0.25 MG tablet Take 1 tablet (250 mcg total) by mouth daily.  30 tablet  6  . pravastatin (PRAVACHOL) 40 MG tablet Take 40 mg by mouth daily.         Allergies  Allergen Reactions  . Nsaids Hives    PHYSICAL EXAM: Filed Vitals:   10/02/11 1223  BP: 160/98  Pulse: 115  Weight: 280 lb 12 oz (127.347 kg)  SpO2: 100%    General:  Well appearing. No respiratory difficulty HEENT: normal Neck: supple. JVP 8. Carotids 2+ bilat; no bruits. No lymphadenopathy or thryomegaly appreciated. Cor: PMI nondisplaced. Tachycardic & regular rhythm. No rubs, gallops or murmurs. Lungs: clear Abdomen: soft, nontender, nondistended. No hepatosplenomegaly. No bruits or masses. Good bowel sounds. Extremities: no cyanosis, clubbing, rash, edema Neuro: alert & oriented x 3, cranial nerves grossly  intact. moves all 4 extremities w/o difficulty. Affect pleasant.    ASSESSMENT & PLAN:

## 2011-10-02 NOTE — Assessment & Plan Note (Signed)
Will give trial of viagra.  

## 2011-10-02 NOTE — Assessment & Plan Note (Addendum)
His volume is difficult to assess but weight is up today.  Will increase lasix to 40 mg BID.  Have discussed daily weights and importance of medication compliance.  He voices understanding.  Will not titrate other meds at this time as he has been out of coreg and digoxin.  He has been instructed to restart these, he will pick up refills today.    Patient seen and examined with Ulyess Blossom PA-C. We discussed all aspects of the encounter. I agree with the assessment and plan as stated above. Volume doesn't look too bad on exam but weight up significantly. Will increase lasix. Resume other meds. Reinforced need for daily weights and reviewed use of sliding scale diuretics. Reinforced need to contact clinic if running out of meds.

## 2011-10-18 ENCOUNTER — Ambulatory Visit (HOSPITAL_COMMUNITY)
Admission: RE | Admit: 2011-10-18 | Discharge: 2011-10-18 | Disposition: A | Discharge status: Home / Self Care | Payer: Self-pay | Source: Ambulatory Visit | Attending: Internal Medicine | Admitting: Internal Medicine

## 2011-10-18 VITALS — BP 164/108 | HR 94 | Wt 285.5 lb

## 2011-10-18 DIAGNOSIS — I5022 Chronic systolic (congestive) heart failure: Secondary | ICD-10-CM | POA: Insufficient documentation

## 2011-10-18 MED ORDER — SPIRONOLACTONE 25 MG PO TABS: 25.0000 mg | ORAL_TABLET | Freq: Every day | ORAL | Status: DC

## 2011-10-18 NOTE — Assessment & Plan Note (Addendum)
NYHA II. Volume status mildly elevated. Will add Spironolactone 25 mg daily which will help with volume and blood pressure. He is instructed to weigh and record daily, take all medications as ordered, and follow low salt diet. He will follow up in 3 weeks with BMET.   Patient seen and examined with Tonye Becket, NP. We discussed all aspects of the encounter. I agree with the assessment and plan as stated above. Volume status up on exam. Struggling with compliance issues. Reinforced need for daily weights and reviewed use of sliding scale diuretics. Will add spiro. Check labs next week. Continue to follow closely.

## 2011-10-18 NOTE — Progress Notes (Signed)
Patient ID: Mitchell [name], male   DOB: 1971/05/12, 41 y.o.   MRN: [id number] HPI:   Mr. Wilbon is a 41 year old male with multiple medical problems including obesity, uncontrolled hypertension, uncontrolled diabetes, obstructive sleep apnea, bipolar disease/schizophrenia, CVA, nonobstructive coronary disease and systolic congestive heart failure secondary to nonischemic cardiomyopathy.  He has had several readmissions due to running out of his medications.  He recently was hospitalized for this problem with a 12 pound weight gain.  He was diuresed to 273 pounds  09/23/11: 2D echo checked with fall in LVEF to 15-20% with mild to moderate mitral valve regurgitation  Returns for follow up today.  Denies SOB/PND/Orthopnea. Not weighing daily because scales are broken. He is currently out of seroquel, zoloft, and vistaril. Plans to return to Mental Health appointment         ROS: As per HPI otherwise negative  Past Medical History  Diagnosis Date  . Hypertension   . Hyperlipidemia   . Depression   . OSA (obstructive sleep apnea)     On CPAP; NON-COMPLIANT  . CVA (cerebral infarction)     No residual deficits  . Non-ischemic cardiomyopathy     No ischemia on myoview, showed EF of 42%. 2D echo showed EF 50-55% with diatolic dysfunction in 2009  . Post-cardiac injury syndrome     History of cardiac injury from blunt trauma  . PVCs (premature ventricular contractions)     Followed by [facility].   [name] Kitchen History of colonic polyps   . Obesity   . Syncope     Recurrent, thought to be vasovagal. Also has h/o frequent PVCs.   . CHF (congestive heart failure) 11/2010    ACUTE SYSTOLIC CHF, THIS IS A NEW DX  . SOB (shortness of breath)   . Stroke   . 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 Myocardial infarction   . Headache   . Diabetes mellitus     TYPE II; UNCONTROLLED BY HEMOGLOBIN A1c; STABLE AS  PER DISCHARGE    Current Outpatient Prescriptions  Medication Sig Dispense Refill  .  aspirin 81 MG EC tablet Take 81 mg by mouth daily.       . carvedilol (COREG) 6.25 MG tablet Take 1 tablet (6.25 mg total) by mouth 2 (two) times daily with a meal.  60 tablet  6  . digoxin (LANOXIN) 0.25 MG tablet Take 1 tablet (250 mcg total) by mouth daily.  30 tablet  6  . furosemide (LASIX) 40 MG tablet Take 1 tablet (40 mg total) by mouth 2 (two) times daily.  70 tablet  6  . glipiZIDE (GLUCOTROL XL) 10 MG 24 hr tablet Take 10 mg by mouth daily.      . hydrOXYzine (VISTARIL) 25 MG capsule Take 25-50 mg by mouth 3 (three) times daily as needed. For sleep      . insulin aspart protamine-insulin aspart (NOVOLOG 70/30) (70-30) 100 UNIT/ML injection Inject 10 Units into the skin 2 (two) times daily with a meal.  3 mL  3  . lisinopril (PRINIVIL,ZESTRIL) 10 MG tablet Take 10 mg by mouth daily.       . metFORMIN (GLUCOPHAGE) 1000 MG tablet Take 1,000 mg by mouth 2 (two) times daily with a meal.      . pravastatin (PRAVACHOL) 40 MG tablet Take 40 mg by mouth daily.       . QUEtiapine (SEROQUEL) 100 MG tablet Take 250 mg by mouth at bedtime.       [name] Kitchen  sertraline (ZOLOFT) 100 MG tablet Take 100 mg by mouth 2 (two) times daily.       . potassium chloride SA (K-DUR,KLOR-CON) 20 MEQ tablet Take 20 mEq by mouth daily.       . sildenafil (VIAGRA) 100 MG tablet Take 1 tablet (100 mg total) by mouth daily as needed for erectile dysfunction.  8 tablet  0    Allergies  Allergen Reactions  . Nsaids Hives    PHYSICAL EXAM: Filed Vitals:   10/18/11 1050  BP: 164/108  Pulse: 94  Weight: 285 lb 8 oz (129.502 kg)  SpO2: 100%    General:  Well appearing. No respiratory difficulty HEENT: normal Neck: supple. JVP 8. Carotids 2+ bilat; no bruits. No lymphadenopathy or thryomegaly appreciated. Cor: PMI nondisplaced. Tachycardic & regular rhythm. No rubs, gallops or murmurs. Lungs: clear Abdomen: soft, nontender, nondistended. No hepatosplenomegaly. No bruits or masses. Good bowel sounds. Extremities: no  cyanosis, clubbing, rash, edema Neuro: alert & oriented x 3, cranial nerves grossly intact. moves all 4 extremities w/o difficulty. Affect pleasant.    ASSESSMENT & PLAN:

## 2011-10-18 NOTE — Patient Instructions (Signed)
Take Spironolactone 25 mg daily  Follow up in 3 weeks.  Do the following things EVERYDAY: 1) Weigh yourself in the morning before breakfast. Write it down and keep it in a log. 2) Take your medicines as prescribed 3) Eat low salt foods--Limit salt (sodium) to 2000mg  per day.  4) Stay as active as you can everyday

## 2011-11-10 ENCOUNTER — Encounter (HOSPITAL_COMMUNITY): Payer: Self-pay

## 2011-11-23 ENCOUNTER — Ambulatory Visit (HOSPITAL_COMMUNITY)
Admission: RE | Admit: 2011-11-23 | Discharge: 2011-11-23 | Disposition: A | Discharge status: Home / Self Care | Payer: Self-pay | Source: Ambulatory Visit | Attending: Internal Medicine | Admitting: Internal Medicine

## 2011-11-23 ENCOUNTER — Telehealth (HOSPITAL_COMMUNITY): Payer: Self-pay | Admitting: Adult Health

## 2011-11-23 VITALS — BP 142/90 | HR 108 | Wt 281.5 lb

## 2011-11-23 DIAGNOSIS — I5022 Chronic systolic (congestive) heart failure: Secondary | ICD-10-CM | POA: Insufficient documentation

## 2011-11-23 LAB — BASIC METABOLIC PANEL
Calcium: 9.9 mg/dL (ref 8.4–10.5)
GFR calc non Af Amer: >90 mL/min (ref >90)
Glucose, Bld: 386 mg/dL — ABNORMAL HIGH (ref 70–99)
Potassium: 4.3 mEq/L (ref 3.5–5.1)
Sodium: 133 mEq/L — ABNORMAL LOW (ref 135–145)

## 2011-11-23 MED ORDER — CARVEDILOL 6.25 MG PO TABS: 9.3750 mg | ORAL_TABLET | Freq: Two times a day (BID) | ORAL | Status: DC

## 2011-11-23 NOTE — Telephone Encounter (Signed)
Provided lab results. Instructed to insulin as ordered.

## 2011-11-23 NOTE — Assessment & Plan Note (Addendum)
NYHA II. Volume status stable. Reviewed most recent BMET. Will not titrate Lisinopril as Creatinine was 1.5. Increase Carvedilol 9. 375 mg twice a day.Discussed potential side effects associated with increased Carvedilol such as fatigue and edema. Will continue to titrate medications as tolerated. Repeat BMET today. Reinforced daily weights, low salt diet, and medication compliance. Follow up in 2 weeks.

## 2011-11-23 NOTE — Progress Notes (Signed)
Encounter addended by: Noralee Space, RN on: 11/23/2011 11:51 AM<BR>     Documentation filed: Orders
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
## 2011-11-23 NOTE — Patient Instructions (Signed)
Take Carvedilol 9.375 mg twice a day   Follow up in 2 weeks   Do the following things EVERYDAY: 1) Weigh yourself in the morning before breakfast. Write it down and keep it in a log. 2) Take your medicines as prescribed 3) Eat low salt foods--Limit salt (sodium) to 2000mg  per day.  4) Stay as active as you can everyday

## 2011-12-08 ENCOUNTER — Ambulatory Visit (HOSPITAL_COMMUNITY)
Admission: RE | Admit: 2011-12-08 | Discharge: 2011-12-08 | Disposition: A | Discharge status: Home / Self Care | Payer: Self-pay | Source: Ambulatory Visit | Attending: Internal Medicine | Admitting: Internal Medicine

## 2011-12-08 VITALS — BP 162/100 | HR 103 | Wt 278.8 lb

## 2011-12-08 DIAGNOSIS — I252 Old myocardial infarction: Secondary | ICD-10-CM | POA: Insufficient documentation

## 2011-12-08 DIAGNOSIS — Z860100 Personal history of colon polyps, unspecified: Secondary | ICD-10-CM | POA: Insufficient documentation

## 2011-12-08 DIAGNOSIS — Z794 Long term (current) use of insulin: Secondary | ICD-10-CM | POA: Insufficient documentation

## 2011-12-08 DIAGNOSIS — G4733 Obstructive sleep apnea (adult) (pediatric): Secondary | ICD-10-CM | POA: Insufficient documentation

## 2011-12-08 DIAGNOSIS — I428 Other cardiomyopathies: Secondary | ICD-10-CM | POA: Insufficient documentation

## 2011-12-08 DIAGNOSIS — I5022 Chronic systolic (congestive) heart failure: Secondary | ICD-10-CM | POA: Insufficient documentation

## 2011-12-08 DIAGNOSIS — F319 Bipolar disorder, unspecified: Secondary | ICD-10-CM | POA: Insufficient documentation

## 2011-12-08 DIAGNOSIS — I4949 Other premature depolarization: Secondary | ICD-10-CM | POA: Insufficient documentation

## 2011-12-08 DIAGNOSIS — R0602 Shortness of breath: Secondary | ICD-10-CM | POA: Insufficient documentation

## 2011-12-08 DIAGNOSIS — I509 Heart failure, unspecified: Secondary | ICD-10-CM | POA: Insufficient documentation

## 2011-12-08 DIAGNOSIS — IMO0001 Reserved for inherently not codable concepts without codable children: Secondary | ICD-10-CM | POA: Insufficient documentation

## 2011-12-08 DIAGNOSIS — I10 Essential (primary) hypertension: Secondary | ICD-10-CM | POA: Insufficient documentation

## 2011-12-08 DIAGNOSIS — E669 Obesity, unspecified: Secondary | ICD-10-CM | POA: Insufficient documentation

## 2011-12-08 DIAGNOSIS — Z7982 Long term (current) use of aspirin: Secondary | ICD-10-CM | POA: Insufficient documentation

## 2011-12-08 DIAGNOSIS — E785 Hyperlipidemia, unspecified: Secondary | ICD-10-CM | POA: Insufficient documentation

## 2011-12-08 DIAGNOSIS — Z8673 Personal history of transient ischemic attack (TIA), and cerebral infarction without residual deficits: Secondary | ICD-10-CM | POA: Insufficient documentation

## 2011-12-08 DIAGNOSIS — A6000 Herpesviral infection of urogenital system, unspecified: Secondary | ICD-10-CM | POA: Insufficient documentation

## 2011-12-08 DIAGNOSIS — Z888 Allergy status to other drugs, medicaments and biological substances status: Secondary | ICD-10-CM | POA: Insufficient documentation

## 2011-12-08 DIAGNOSIS — I2510 Atherosclerotic heart disease of native coronary artery without angina pectoris: Secondary | ICD-10-CM | POA: Insufficient documentation

## 2011-12-08 DIAGNOSIS — F209 Schizophrenia, unspecified: Secondary | ICD-10-CM | POA: Insufficient documentation

## 2011-12-08 NOTE — Patient Instructions (Addendum)
Will restart meds today.  If you are tolerating restarting meds then in 1 week increase carvedilol to 2 tabs (12.5 mg) twice daily.  Follow up 3 weeks with labs.
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
## 2011-12-08 NOTE — Assessment & Plan Note (Signed)
NYHA II.  Volume status looks pretty good today despite being off medications for a week.  Will give him a month of HF meds.  If he tolerates restarting his meds today then he will increase carvedilol 12.5 mg BID next week.  Will also provide him with a scale today, reinforced daily weights.

## 2011-12-08 NOTE — Assessment & Plan Note (Addendum)
Uncontrolled today due to patient not having medications this week.  Will restart meds and follow up in next couple of weeks for further titration. [name] Kitchen

## 2011-12-29 ENCOUNTER — Ambulatory Visit (HOSPITAL_COMMUNITY)
Admission: RE | Admit: 2011-12-29 | Discharge: 2011-12-29 | Disposition: A | Discharge status: Home / Self Care | Payer: Self-pay | Source: Ambulatory Visit | Attending: Internal Medicine | Admitting: Internal Medicine

## 2011-12-29 VITALS — BP 150/100 | HR 112 | Wt 272.0 lb

## 2011-12-29 DIAGNOSIS — I509 Heart failure, unspecified: Secondary | ICD-10-CM | POA: Insufficient documentation

## 2011-12-29 DIAGNOSIS — F209 Schizophrenia, unspecified: Secondary | ICD-10-CM | POA: Insufficient documentation

## 2011-12-29 DIAGNOSIS — Z794 Long term (current) use of insulin: Secondary | ICD-10-CM | POA: Insufficient documentation

## 2011-12-29 DIAGNOSIS — I4949 Other premature depolarization: Secondary | ICD-10-CM | POA: Insufficient documentation

## 2011-12-29 DIAGNOSIS — IMO0001 Reserved for inherently not codable concepts without codable children: Secondary | ICD-10-CM | POA: Insufficient documentation

## 2011-12-29 DIAGNOSIS — Z8673 Personal history of transient ischemic attack (TIA), and cerebral infarction without residual deficits: Secondary | ICD-10-CM | POA: Insufficient documentation

## 2011-12-29 DIAGNOSIS — I428 Other cardiomyopathies: Secondary | ICD-10-CM | POA: Insufficient documentation

## 2011-12-29 DIAGNOSIS — Z7982 Long term (current) use of aspirin: Secondary | ICD-10-CM | POA: Insufficient documentation

## 2011-12-29 DIAGNOSIS — I10 Essential (primary) hypertension: Secondary | ICD-10-CM | POA: Insufficient documentation

## 2011-12-29 DIAGNOSIS — I5022 Chronic systolic (congestive) heart failure: Secondary | ICD-10-CM | POA: Insufficient documentation

## 2011-12-29 DIAGNOSIS — F319 Bipolar disorder, unspecified: Secondary | ICD-10-CM | POA: Insufficient documentation

## 2011-12-29 DIAGNOSIS — E785 Hyperlipidemia, unspecified: Secondary | ICD-10-CM | POA: Insufficient documentation

## 2011-12-29 DIAGNOSIS — I2510 Atherosclerotic heart disease of native coronary artery without angina pectoris: Secondary | ICD-10-CM | POA: Insufficient documentation

## 2011-12-29 DIAGNOSIS — G4733 Obstructive sleep apnea (adult) (pediatric): Secondary | ICD-10-CM | POA: Insufficient documentation

## 2011-12-29 DIAGNOSIS — I252 Old myocardial infarction: Secondary | ICD-10-CM | POA: Insufficient documentation

## 2011-12-29 DIAGNOSIS — A6000 Herpesviral infection of urogenital system, unspecified: Secondary | ICD-10-CM | POA: Insufficient documentation

## 2011-12-29 DIAGNOSIS — E669 Obesity, unspecified: Secondary | ICD-10-CM | POA: Insufficient documentation

## 2011-12-29 MED ORDER — CARVEDILOL 12.5 MG PO TABS: 18.7500 mg | ORAL_TABLET | Freq: Two times a day (BID) | ORAL | Status: DC

## 2011-12-29 NOTE — Assessment & Plan Note (Addendum)
He returns for follow and he is doing quite well. He has been complaint with medications over the last 3 months. Now NYHA II. Volume status stable. Will increase carvedilol 18.75 mg twice a day. Follow up in 3 weeks with an ECHO. IF EF remains less than 35% will need to refer to EP.  Anticipate increasing lisinopril at next visit.   Patient seen and examined with Tonye Becket, NP. We discussed all aspects of the encounter. I agree with the assessment and plan as stated above.  Much improved. Much more compliant except that he did not take meds this am as he though he might have labs drawn. Reinforced importance of taking all meds regularly. Reinforced need for daily weights and reviewed use of sliding scale diuretics. Discussed role of ICD at length. Needs f/u echo. Agree with med titration as described above.
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
## 2011-12-29 NOTE — Patient Instructions (Signed)
Take Carvedilol 18.75 mg twice a day  Follow up in 3 weeks with an ECHO  Do the following things EVERYDAY: 1) Weigh yourself in the morning before breakfast. Write it down and keep it in a log. 2) Take your medicines as prescribed 3) Eat low salt foods--Limit salt (sodium) to 2000 mg per day.  4) Stay as active as you can everyday 5) Limit all fluids for the day to less than 2 liters

## 2011-12-30 NOTE — Assessment & Plan Note (Signed)
BP elevated in setting of missing am meds. Reinforced need to take meds on regular schedule.

## 2012-01-17 ENCOUNTER — Ambulatory Visit (HOSPITAL_COMMUNITY): Payer: No Typology Code available for payment source | Attending: Internal Medicine

## 2012-01-18 ENCOUNTER — Encounter (HOSPITAL_COMMUNITY): Payer: Self-pay

## 2012-01-18 ENCOUNTER — Ambulatory Visit (HOSPITAL_COMMUNITY)
Admission: RE | Admit: 2012-01-18 | Discharge: 2012-01-18 | Disposition: A | Discharge status: Home / Self Care | Payer: Self-pay | Source: Ambulatory Visit | Attending: Internal Medicine | Admitting: Internal Medicine

## 2012-01-18 VITALS — BP 140/82 | HR 91 | Resp 18 | Ht 71.0 in | Wt 274.0 lb

## 2012-01-18 DIAGNOSIS — I5022 Chronic systolic (congestive) heart failure: Secondary | ICD-10-CM | POA: Insufficient documentation

## 2012-01-18 NOTE — Patient Instructions (Addendum)
Restart medications.  Follow up 2 months with echo.  Your physician has requested that you have an echocardiogram. Echocardiography is a painless test that uses sound waves to create images of your heart. It provides your doctor with information about the size and shape of your heart and how well your heart's chambers and valves are working. This procedure takes approximately one hour. There are no restrictions for this procedure.

## 2012-01-18 NOTE — Assessment & Plan Note (Addendum)
Volume status stable.  NYHA II.  Will restart medications today.  Hold off on echo at this time as he has been off his medications.  He has been ok'd to restart seroquel.  Dr. Gala Romney will contact [facility].    Attending: Patient seen and examined with Ulyess Blossom, PA-C. We discussed all aspects of the encounter. I agree with the assessment and plan as stated above. He has been off his medications due to being in the [facility]. Volume status only slightly elevated. NYHA II. Will resume HF meds. I called Deetta Perla office at [facility] [phone]) and left a message that he was OK to restart his Seroquel from a HF standpoint.

## 2012-01-18 NOTE — Assessment & Plan Note (Addendum)
BP elevated but has been off his medications.  Will restart, as above.  Attending: Agree.

## 2012-01-18 NOTE — Progress Notes (Signed)
PCP: None ? [facility]: [facility]   HPI:   Mr. Rhinesmith is a 41 year old male with multiple medical problems including obesity, uncontrolled hypertension, uncontrolled diabetes, obstructive sleep apnea, bipolar disease/schizophrenia, CVA, nonobstructive coronary disease and systolic congestive heart failure secondary to nonischemic cardiomyopathy.  He has had several readmissions due to running out of his medications.  He recently was hospitalized for this problem with a 12 pound weight gain.  He was diuresed to 273 pounds  09/23/11: 2D echo checked with reduction in LVEF to 15-20% with mild to moderate mitral valve regurgitation  He returns for follow up today.  He has been in psyc intake in [facility] because he was not taking his seroquel as this was court ordered.  He has not been taking any medications while locked up.  He states he needs an ok by Dr. Gala Romney to be on seroquel.  He is frustrated with his situation.  He denies SOB/orthopnea/PND.  He denies edema.    ROS: As per HPI otherwise negative  Past Medical History  Diagnosis Date  . Hypertension   . Hyperlipidemia   . Depression   . OSA (obstructive sleep apnea)     On CPAP; NON-COMPLIANT  . CVA (cerebral infarction)     No residual deficits  . Non-ischemic cardiomyopathy     No ischemia on myoview, showed EF of 42%. 2D echo showed EF 50-55% with diatolic dysfunction in 2009  . Post-cardiac injury syndrome     History of cardiac injury from blunt trauma  . PVCs (premature ventricular contractions)     Followed by [facility].   [name] Kitchen History of colonic polyps   . Obesity   . Syncope     Recurrent, thought to be vasovagal. Also has h/o frequent PVCs.   . CHF (congestive heart failure) 11/2010    ACUTE SYSTOLIC CHF, THIS IS A NEW DX  . SOB (shortness of breath)   . Stroke   . 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 Myocardial infarction   . Headache   . Diabetes mellitus     TYPE II; UNCONTROLLED BY  HEMOGLOBIN A1c; STABLE AS  PER DISCHARGE    Current Outpatient Prescriptions  Medication Sig Dispense Refill  . aspirin 81 MG EC tablet Take 81 mg by mouth daily.       . carvedilol (COREG) 12.5 MG tablet Take 1.5 tablets (18.75 mg total) by mouth 2 (two) times daily with a meal.  90 tablet  3  . digoxin (LANOXIN) 0.25 MG tablet Take 1 tablet (250 mcg total) by mouth daily.  30 tablet  6  . furosemide (LASIX) 40 MG tablet Take 1 tablet (40 mg total) by mouth 2 (two) times daily.  70 tablet  6  . glipiZIDE (GLUCOTROL XL) 10 MG 24 hr tablet Take 10 mg by mouth daily.      . hydrOXYzine (VISTARIL) 25 MG capsule Take 75 mg by mouth at bedtime as needed. For sleep      . insulin aspart protamine-insulin aspart (NOVOLOG 70/30) (70-30) 100 UNIT/ML injection Inject 10 Units into the skin 2 (two) times daily with a meal.  3 mL  3  . lisinopril (PRINIVIL,ZESTRIL) 10 MG tablet Take 10 mg by mouth daily.       . metFORMIN (GLUCOPHAGE) 1000 MG tablet Take 1,000 mg by mouth 2 (two) times daily with a meal.      . pravastatin (PRAVACHOL) 40 MG tablet Take 40 mg by mouth daily.       [name] Kitchen  sertraline (ZOLOFT) 100 MG tablet Take 100 mg by mouth 2 (two) times daily.       [name] Kitchen spironolactone (ALDACTONE) 25 MG tablet Take 1 tablet (25 mg total) by mouth daily.  30 tablet  6  . sildenafil (VIAGRA) 100 MG tablet Take 1 tablet (100 mg total) by mouth daily as needed for erectile dysfunction.  8 tablet  0    Allergies  Allergen Reactions  . Nsaids Hives    PHYSICAL EXAM: Filed Vitals:   01/18/12 1027  BP: 140/82  Pulse: 91  Resp: 18  Height: 5\' 11"  (1.803 m)  Weight: 274 lb (124.286 kg)  SpO2: 98%   General:  Well appearing. No respiratory difficulty HEENT: normal Neck: supple. JVP 5-6. Carotids 2+ bilat; no bruits. No lymphadenopathy or thryomegaly appreciated. Cor: PMI nondisplaced. Tachycardic & regular rhythm. No rubs, gallops or murmurs. Lungs: clear Abdomen: soft, nontender, nondistended. No  hepatosplenomegaly. No bruits or masses. Good bowel sounds. Extremities: no cyanosis, clubbing, rash, edema Neuro: alert & oriented x 3, cranial nerves grossly intact. moves all 4 extremities w/o difficulty. Affect pleasant.    ASSESSMENT & PLAN:

## 2012-01-18 NOTE — Addendum Note (Signed)
Encounter addended by: Dolores Patty, MD on: 01/18/2012  2:33 PM<BR>     Documentation filed: Follow-up Section, LOS Section

## 2012-01-29 ENCOUNTER — Telehealth (HOSPITAL_COMMUNITY): Payer: Self-pay | Admitting: *Deleted

## 2012-01-29 NOTE — Telephone Encounter (Signed)
Sarah from [facility] called to speak with Dr Gala Romney regarding Mitchell [name] (?)  medication.  She would like a call back.  Thanks.

## 2012-01-29 NOTE — Telephone Encounter (Signed)
He is able to take as Seroquel as prescribed. Follow up with PCP regarding diabetes. She appreciated the return phone call.

## 2012-02-29 ENCOUNTER — Encounter (HOSPITAL_BASED_OUTPATIENT_CLINIC_OR_DEPARTMENT_OTHER): Payer: Self-pay | Admitting: Emergency Medicine

## 2012-02-29 ENCOUNTER — Emergency Department (HOSPITAL_BASED_OUTPATIENT_CLINIC_OR_DEPARTMENT_OTHER)
Admission: EM | Admit: 2012-02-29 | Discharge: 2012-02-29 | Disposition: A | Discharge status: Home / Self Care | Payer: Self-pay | Attending: Emergency Medicine | Admitting: Emergency Medicine

## 2012-02-29 DIAGNOSIS — I509 Heart failure, unspecified: Secondary | ICD-10-CM | POA: Insufficient documentation

## 2012-02-29 DIAGNOSIS — E785 Hyperlipidemia, unspecified: Secondary | ICD-10-CM | POA: Insufficient documentation

## 2012-02-29 DIAGNOSIS — K029 Dental caries, unspecified: Secondary | ICD-10-CM | POA: Insufficient documentation

## 2012-02-29 DIAGNOSIS — E119 Type 2 diabetes mellitus without complications: Secondary | ICD-10-CM | POA: Insufficient documentation

## 2012-02-29 DIAGNOSIS — E669 Obesity, unspecified: Secondary | ICD-10-CM | POA: Insufficient documentation

## 2012-02-29 DIAGNOSIS — I10 Essential (primary) hypertension: Secondary | ICD-10-CM | POA: Insufficient documentation

## 2012-02-29 DIAGNOSIS — Z8673 Personal history of transient ischemic attack (TIA), and cerebral infarction without residual deficits: Secondary | ICD-10-CM | POA: Insufficient documentation

## 2012-02-29 DIAGNOSIS — G4733 Obstructive sleep apnea (adult) (pediatric): Secondary | ICD-10-CM | POA: Insufficient documentation

## 2012-02-29 DIAGNOSIS — I252 Old myocardial infarction: Secondary | ICD-10-CM | POA: Insufficient documentation

## 2012-02-29 MED ORDER — OXYCODONE-ACETAMINOPHEN 5-325 MG PO TABS
1.0000 | ORAL_TABLET | Freq: Once | ORAL | Status: AC
  Administered 2012-02-29: 1 via ORAL
  Filled 2012-02-29 (×2): qty 1

## 2012-02-29 MED ORDER — PENICILLIN V POTASSIUM 250 MG PO TABS: 250.0000 mg | ORAL_TABLET | Freq: Four times a day (QID) | ORAL | Status: AC

## 2012-02-29 MED ORDER — OXYCODONE-ACETAMINOPHEN 5-325 MG PO TABS: 1.0000 | ORAL_TABLET | Freq: Four times a day (QID) | ORAL | Status: AC | PRN

## 2012-02-29 MED ORDER — METFORMIN HCL 1000 MG PO TABS: 1000.0000 mg | ORAL_TABLET | Freq: Two times a day (BID) | ORAL | Status: AC

## 2012-02-29 NOTE — ED Notes (Addendum)
Reports left lower jaw pain and swelling.  States he has a bad tooth.  Additionally is out of Metformin and needs a refill.  Has not had any for "a while."  Is suppose to be on Seroquel but his psych clinician will not refill until he gets back on metformin.

## 2012-02-29 NOTE — ED Provider Notes (Signed)
History     CSN: [id number]  Arrival date & time 02/29/12  2130   First MD Initiated Contact with Patient 02/29/12 2022      Chief Complaint  Patient presents with  . Dental Pain    (Consider location/radiation/quality/duration/timing/severity/associated sxs/prior treatment) Patient is a 41 y.o. male presenting with tooth pain. The history is provided by the patient.  Dental PainThe primary symptoms include mouth pain. Primary symptoms do not include fever or cough. Episode onset: 4 days ago. The symptoms are worsening. The symptoms are new. The symptoms occur constantly.  Additional symptoms include: dental sensitivity to temperature, gum swelling, gum tenderness and jaw pain. Additional symptoms do not include: facial swelling, trouble swallowing, pain with swallowing and ear pain. Medical issues include: periodontal disease. Medical issues do not include: smoking.    Past Medical History  Diagnosis Date  . Hypertension   . Hyperlipidemia   . Depression   . OSA (obstructive sleep apnea)     On CPAP; NON-COMPLIANT  . CVA (cerebral infarction)     No residual deficits  . Non-ischemic cardiomyopathy     No ischemia on myoview, showed EF of 42%. 2D echo showed EF 50-55% with diatolic dysfunction in 2009  . Post-cardiac injury syndrome     History of cardiac injury from blunt trauma  . PVCs (premature ventricular contractions)     Followed by [facility].   [name] Kitchen History of colonic polyps   . Obesity   . Syncope     Recurrent, thought to be vasovagal. Also has h/o frequent PVCs.   . CHF (congestive heart failure) 11/2010    ACUTE SYSTOLIC CHF, THIS IS A NEW DX  . SOB (shortness of breath)   . Stroke   . 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 Myocardial infarction   . Headache   . Diabetes mellitus     TYPE II; UNCONTROLLED BY HEMOGLOBIN A1c; STABLE AS  PER DISCHARGE    Past Surgical History  Procedure Date  . Cardiac catheterization 12/19/10    DIFFUSE NONOBSTRUCTIVE  CAD; NONISCHEMIC CARDIOMYOPATHY; LEFT VENTRICULAR ANGIOGRAM WAS PERFORMED SECONDARY TO  ELEVATED LEFT VENTRICULAR FILLING PRESSURES  . Right thumb     Family History  Problem Relation Age of Onset  . Heart disease Mother     MI  . Heart failure Mother   . Diabetes Mother     ALSO IN MOST OF HIS SIBLINGS; 2 UNLCES HAVE ALSO PASSED AWAY FROM DM  . Cardiomyopathy Mother     History  Substance Use Topics  . Smoking status: Never Smoker   . Smokeless tobacco: Not on file  . Alcohol Use: No      Review of Systems  Constitutional: Negative for fever.  HENT: Negative for ear pain, facial swelling and trouble swallowing.   Respiratory: Negative for cough.   All other systems reviewed and are negative.    Allergies  Nsaids  Home Medications   Current Outpatient Rx  Name Route Sig Dispense Refill  . ACETAMINOPHEN 500 MG PO TABS Oral Take 1,000 mg by mouth every 6 (six) hours as needed. For pain    . ASPIRIN 81 MG PO TBEC Oral Take 81 mg by mouth daily.     Marlin Canary HEADACHE PO Oral Take 2 packets by mouth every 4 (four) hours as needed. For pain    . BC HEADACHE PO Oral Take 2 packets by mouth every 4 (four) hours as needed. For pain    .  BISMUTH SUBSALICYLATE 262 MG/15ML PO SUSP Oral Take 30 mLs by mouth every 6 (six) hours as needed. For upset stomach    . CARVEDILOL 12.5 MG PO TABS Oral Take 1.5 tablets (18.75 mg total) by mouth 2 (two) times daily with a meal. 90 tablet 3  . DIGOXIN 0.25 MG PO TABS Oral Take 1 tablet (250 mcg total) by mouth daily. 30 tablet 6  . FUROSEMIDE 40 MG PO TABS Oral Take 1 tablet (40 mg total) by mouth 2 (two) times daily. 70 tablet 6    May take a second dose for increased weight gain o ...  . GLIPIZIDE ER 10 MG PO TB24 Oral Take 10 mg by mouth daily.    [name] Kitchen LISINOPRIL 10 MG PO TABS Oral Take 10 mg by mouth daily.     [name] Kitchen NAPROXEN SODIUM 220 MG PO TABS Oral Take 440 mg by mouth 2 (two) times daily as needed. For pain    . SERTRALINE HCL 100 MG PO TABS  Oral Take 100 mg by mouth 2 (two) times daily.     [name] Kitchen SPIRONOLACTONE 25 MG PO TABS Oral Take 1 tablet (25 mg total) by mouth daily. 30 tablet 6  . TRAZODONE HCL 100 MG PO TABS Oral Take 100-200 mg by mouth at bedtime as needed. For sleep      BP 189/107  Temp 98.5 F (36.9 C) (Oral)  Ht 5\' 11"  (1.803 m)  Wt 280 lb (127.007 kg)  BMI 39.05 kg/m2  SpO2 99%  Physical Exam  Nursing note and vitals reviewed. Constitutional: He is oriented to person, place, and time. He appears well-developed and well-nourished. No distress.  HENT:  Head: Normocephalic and atraumatic.  Mouth/Throat: Oropharynx is clear and moist. Dental caries present.    Eyes: Conjunctivae and EOM are normal. Pupils are equal, round, and reactive to light.  Neck: Normal range of motion. Neck supple.  Cardiovascular: Normal rate, regular rhythm and intact distal pulses.   No murmur heard. Pulmonary/Chest: Effort normal and breath sounds normal. No respiratory distress. He has no wheezes. He has no rales.  Musculoskeletal: Normal range of motion. He exhibits no edema and no tenderness.  Neurological: He is alert and oriented to person, place, and time.  Skin: Skin is warm and dry. No rash noted. No erythema.  Psychiatric: He has a normal mood and affect. His behavior is normal.    ED Course  Procedures (including critical care time)  Labs Reviewed - No data to display No results found.   No diagnosis found.    MDM   Pt with dental caries and facial swelling.  No signs of ludwig's angina or difficulty swallowing and no systemic symptoms. Will treat with PCN and have pt f/u with dentist.  Also his states that he is out of his metformin and his doctor left the practice in the practice is closing down. Will refill his metformin.         Gwyneth Sprout, MD 02/29/12 2033

## 2012-05-07 ENCOUNTER — Ambulatory Visit (HOSPITAL_COMMUNITY): Payer: No Typology Code available for payment source | Attending: Internal Medicine

## 2012-05-07 ENCOUNTER — Ambulatory Visit (HOSPITAL_COMMUNITY): Payer: No Typology Code available for payment source

## 2012-05-16 IMAGING — CT CT ABDOMEN WO/W CM
4 of 10 series · 9 of 32 positions shown, 13 images · IV contrast (agent unspecified)
Comparison: Ultrasound exam from 03/24/2011.

CLINICAL DATA: Left renal mass versus dromedary hump.

CT ABDOMEN WITHOUT AND WITH CONTRAST
TECHNIQUE: Multidetector CT imaging of the abdomen was performed
following the standard protocol before and during bolus
administration of intravenous contrast.
Contrast: 100 ml 0mnipaque-BNN

[Series 5: nephrographic total 120s dlay · axial · 0.83mm/px · z∈[-351,-25]mm · 3 of 87 slices shown, 7 images]
[im 1/87  soft-tissue]
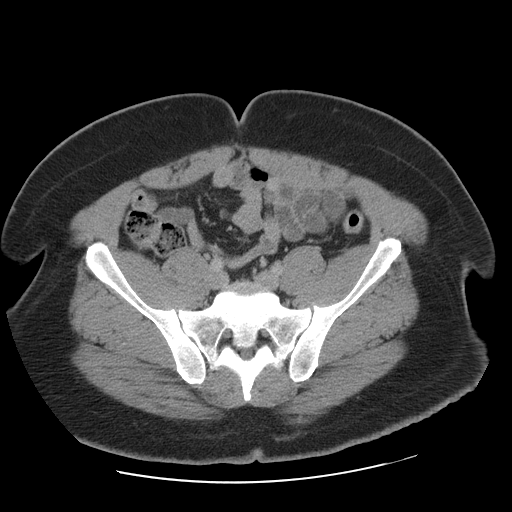
[im 1/87  lung]
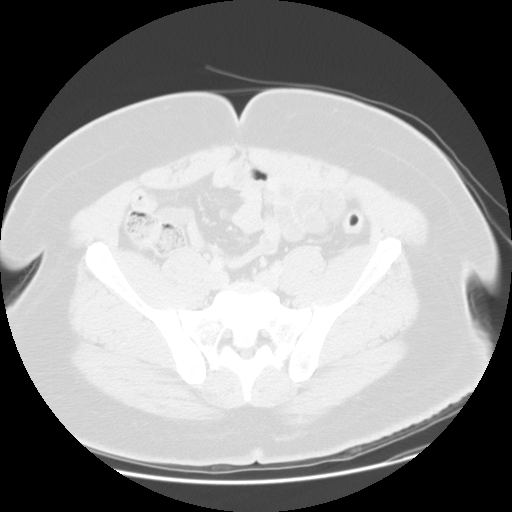
[im 1/87  bone]
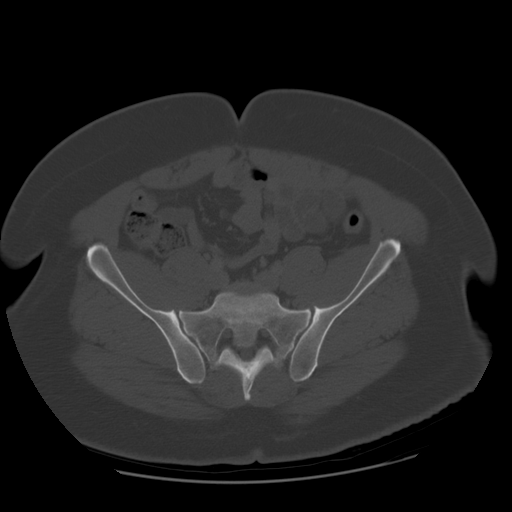
[im 44/87  soft-tissue]
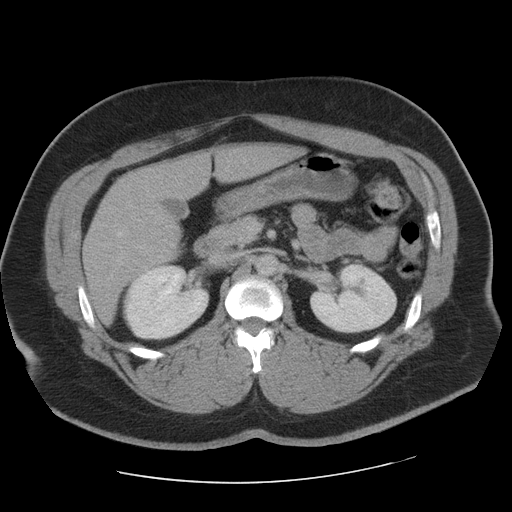
[im 44/87  lung]
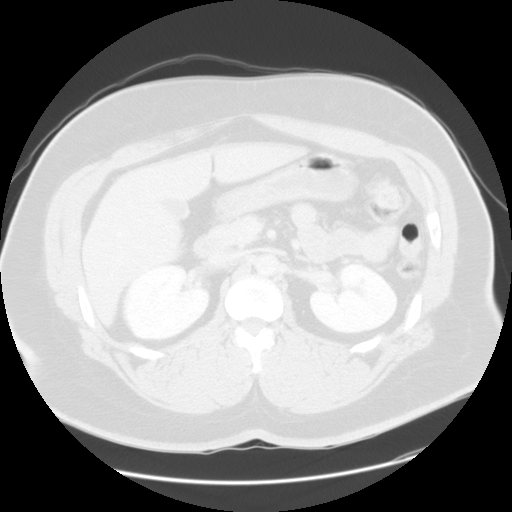
[im 87/87  soft-tissue]
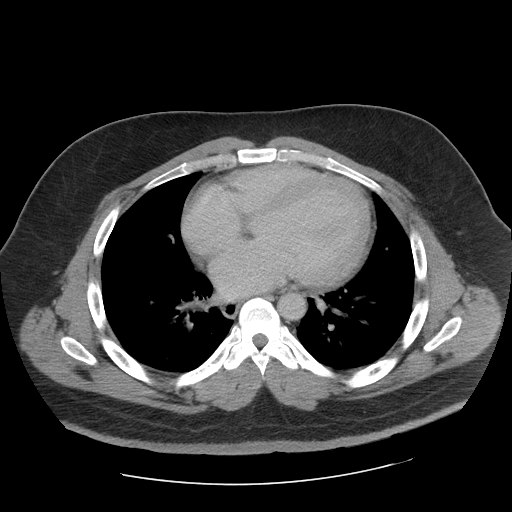
[im 87/87  lung]
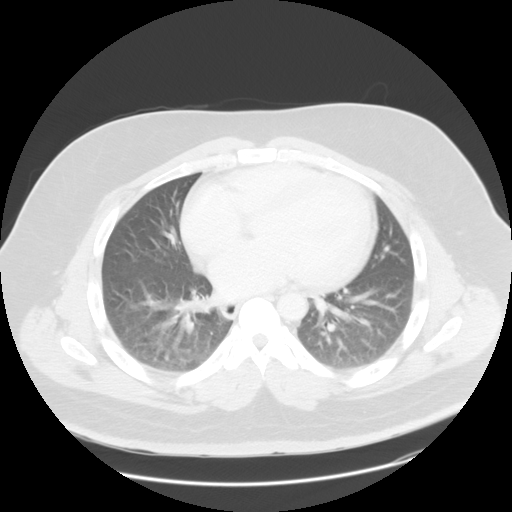

[Series 104: coronal pre · coronal · non-contrast · 0.84mm/px · 2 of 126 slices shown]
[im 42/126  soft-tissue]
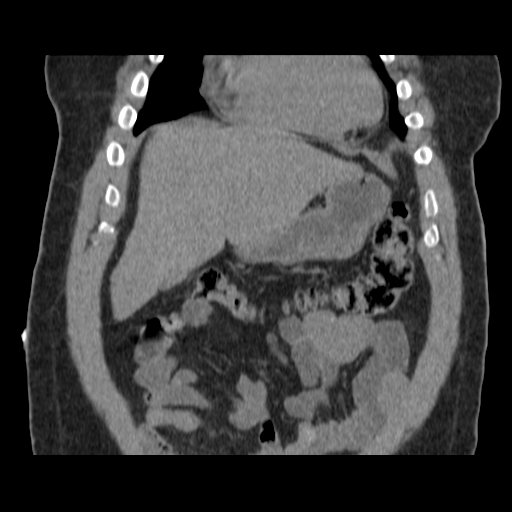
[im 84/126  soft-tissue]
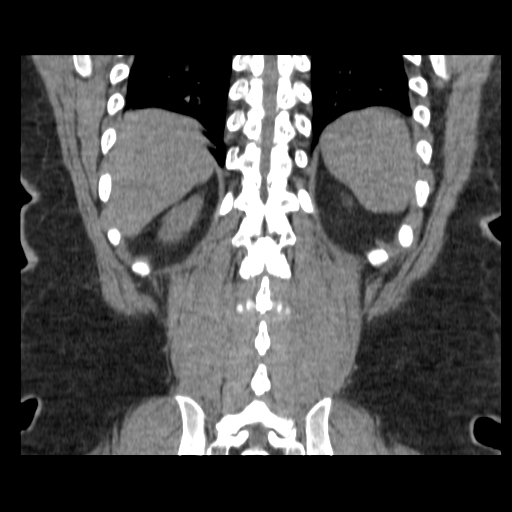

[Series 105: sag pre · sagittal · non-contrast · 0.84mm/px · 2 of 135 slices shown]
[im 45/135  soft-tissue]
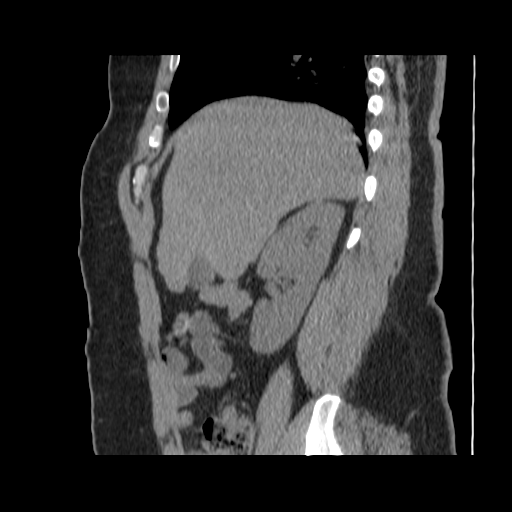
[im 90/135  soft-tissue]
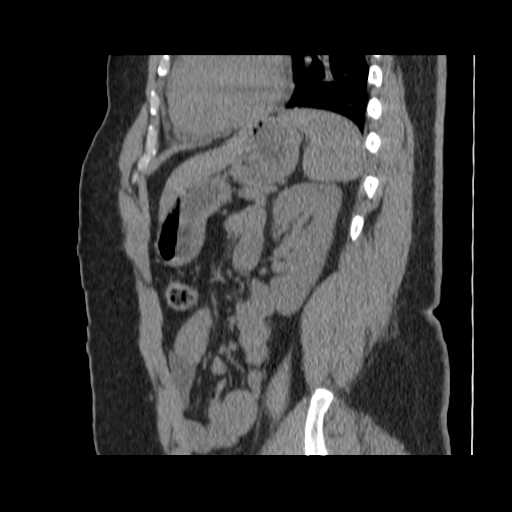

[Series 106: coronal arterial · coronal · arterial · 0.85mm/px · 2 of 122 slices shown]
[im 41/122  soft-tissue]
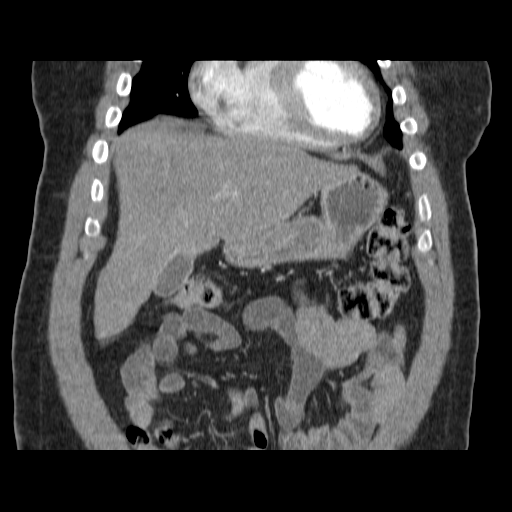
[im 81/122  soft-tissue]
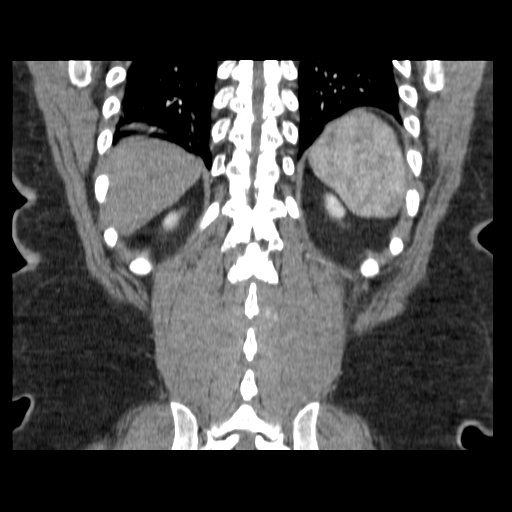

[9 of 32 positions shown; findings below may reference images not displayed]

FINDINGS: Precontrast, arterial phase postcontrast, and delayed
phase postcontrast imaging was performed.  Both kidneys are normal.
Specifically, there is no mass lesion in either kidney.  The
appearance on the ultrasound exam yesterday was secondary to a
dromedary hump.

The liver, spleen, stomach, duodenum, pancreas, gallbladder, and
adrenal glands are normal.

No abdominal aortic aneurysm.  No free fluid or lymphadenopathy in
the abdomen.  The abdominal bowel loops are normal.

Bone windows reveal no worrisome lytic or sclerotic osseous
lesions.
IMPRESSION: Normal exam.  There is no evidence for a left renal mass.  The mass-
like appearance in the left kidney on yesterday's ultrasound was
secondary to a dromedary hump.

## 2012-08-16 ENCOUNTER — Emergency Department (HOSPITAL_COMMUNITY)
Admission: EM | Admit: 2012-08-16 | Discharge: 2012-08-17 | Disposition: A | Discharge status: Home / Self Care | Payer: No Typology Code available for payment source | Attending: Emergency Medicine | Admitting: Emergency Medicine

## 2012-08-16 ENCOUNTER — Encounter (HOSPITAL_COMMUNITY): Payer: Self-pay | Admitting: Emergency Medicine

## 2012-08-16 DIAGNOSIS — Z79899 Other long term (current) drug therapy: Secondary | ICD-10-CM | POA: Insufficient documentation

## 2012-08-16 DIAGNOSIS — T50995A Adverse effect of other drugs, medicaments and biological substances, initial encounter: Secondary | ICD-10-CM | POA: Insufficient documentation

## 2012-08-16 DIAGNOSIS — Z8673 Personal history of transient ischemic attack (TIA), and cerebral infarction without residual deficits: Secondary | ICD-10-CM | POA: Insufficient documentation

## 2012-08-16 DIAGNOSIS — Z8711 Personal history of peptic ulcer disease: Secondary | ICD-10-CM | POA: Insufficient documentation

## 2012-08-16 DIAGNOSIS — Z8619 Personal history of other infectious and parasitic diseases: Secondary | ICD-10-CM | POA: Insufficient documentation

## 2012-08-16 DIAGNOSIS — R079 Chest pain, unspecified: Secondary | ICD-10-CM | POA: Insufficient documentation

## 2012-08-16 DIAGNOSIS — I252 Old myocardial infarction: Secondary | ICD-10-CM | POA: Insufficient documentation

## 2012-08-16 DIAGNOSIS — E119 Type 2 diabetes mellitus without complications: Secondary | ICD-10-CM | POA: Insufficient documentation

## 2012-08-16 DIAGNOSIS — R42 Dizziness and giddiness: Secondary | ICD-10-CM | POA: Insufficient documentation

## 2012-08-16 DIAGNOSIS — R61 Generalized hyperhidrosis: Secondary | ICD-10-CM | POA: Insufficient documentation

## 2012-08-16 DIAGNOSIS — I10 Essential (primary) hypertension: Secondary | ICD-10-CM | POA: Insufficient documentation

## 2012-08-16 DIAGNOSIS — E785 Hyperlipidemia, unspecified: Secondary | ICD-10-CM | POA: Insufficient documentation

## 2012-08-16 DIAGNOSIS — I509 Heart failure, unspecified: Secondary | ICD-10-CM | POA: Insufficient documentation

## 2012-08-16 DIAGNOSIS — F411 Generalized anxiety disorder: Secondary | ICD-10-CM | POA: Insufficient documentation

## 2012-08-16 DIAGNOSIS — T50905A Adverse effect of unspecified drugs, medicaments and biological substances, initial encounter: Secondary | ICD-10-CM

## 2012-08-16 DIAGNOSIS — R5381 Other malaise: Secondary | ICD-10-CM | POA: Insufficient documentation

## 2012-08-16 DIAGNOSIS — R112 Nausea with vomiting, unspecified: Secondary | ICD-10-CM | POA: Insufficient documentation

## 2012-08-16 NOTE — ED Notes (Signed)
Family at bedside.  Pt waiting to be evaluated by ED provider.

## 2012-08-16 NOTE — ED Notes (Signed)
Pt to ED via EMS from home for chest/ left arm pain, diaphoresis, n/v, generalized weakness that began tonight after taking an OTC called "Stamina".  EMS reports pt conscious and alert upon their arrival.  He was diaphoretic and had BGL of 308.  Pt has hx of DM, CHF, Stroke with mild left sided deficit and MI at age 42 from football injury.  States glucometer is broken and hasn't been able to check BGL in about 1 month.  EMS administered 324mg  ASA.

## 2012-08-17 ENCOUNTER — Emergency Department (HOSPITAL_COMMUNITY): Payer: No Typology Code available for payment source

## 2012-08-17 LAB — PRO B NATRIURETIC PEPTIDE: Pro B Natriuretic peptide (BNP): 725 pg/mL — ABNORMAL HIGH (ref 0–125)

## 2012-08-17 LAB — CBC
MCH: 30.5 pg (ref 26.0–34.0)
Platelets: 167 10*3/uL (ref 150–400)
RBC: 4.55 MIL/uL (ref 4.22–5.81)
RDW: 12.3 % (ref 11.5–15.5)
WBC: 13 10*3/uL — ABNORMAL HIGH (ref 4.0–10.5)

## 2012-08-17 LAB — BASIC METABOLIC PANEL
CO2: 31 mEq/L (ref 19–32)
Calcium: 9.5 mg/dL (ref 8.4–10.5)
GFR calc Af Amer: >90 mL/min (ref >90)
Sodium: 138 mEq/L (ref 135–145)

## 2012-08-17 LAB — TROPONIN I: Troponin I: <0.3 ng/mL (ref <0.30)

## 2012-08-17 MED ORDER — LABETALOL HCL 5 MG/ML IV SOLN
20.0000 mg | Freq: Once | INTRAVENOUS | Status: DC
  Filled 2012-08-17: qty 4

## 2012-08-17 NOTE — ED Notes (Signed)
Pt reports he is feeling better, ready to go home, EDP Powers informed

## 2012-08-17 NOTE — ED Provider Notes (Signed)
History     CSN: [id number]  Arrival date & time 08/16/12  2316   First MD Initiated Contact with Patient 08/17/12 [phone]      Chief Complaint  Patient presents with  . Chest Pain  . Weakness    generalized    (Consider location/radiation/quality/duration/timing/severity/associated sxs/prior treatment) HPI Comments: Mr. Lacko presents with his wife for evaluation.  He reports having acute onset chest discomfort, palpitations, tremors, vomiting, and sweating.  It began right after consuming 2 tablets of StaminaX.  He took this OTC supplement in preparation for sexual activity.  He states he feels fine now and reports all of the symptoms have resolved.  The history is provided by the patient. No language interpreter was used.    Past Medical History  Diagnosis Date  . Hypertension   . Hyperlipidemia   . Depression   . OSA (obstructive sleep apnea)     On CPAP; NON-COMPLIANT  . CVA (cerebral infarction)     No residual deficits  . Non-ischemic cardiomyopathy     No ischemia on myoview, showed EF of 42%. 2D echo showed EF 50-55% with diatolic dysfunction in 2009  . Post-cardiac injury syndrome     History of cardiac injury from blunt trauma  . PVCs (premature ventricular contractions)     Followed by [facility].   [name] Kitchen History of colonic polyps   . Obesity   . Syncope     Recurrent, thought to be vasovagal. Also has h/o frequent PVCs.   . CHF (congestive heart failure) 11/2010    ACUTE SYSTOLIC CHF, THIS IS A NEW DX  . SOB (shortness of breath)   . Stroke   . 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 Myocardial infarction   . Headache   . Diabetes mellitus     TYPE II; UNCONTROLLED BY HEMOGLOBIN A1c; STABLE AS  PER DISCHARGE    Past Surgical History  Procedure Date  . Cardiac catheterization 12/19/10    DIFFUSE NONOBSTRUCTIVE CAD; NONISCHEMIC CARDIOMYOPATHY; LEFT VENTRICULAR ANGIOGRAM WAS PERFORMED SECONDARY TO  ELEVATED LEFT VENTRICULAR FILLING PRESSURES  . Right  thumb     Family History  Problem Relation Age of Onset  . Heart disease Mother     MI  . Heart failure Mother   . Diabetes Mother     ALSO IN MOST OF HIS SIBLINGS; 2 UNLCES HAVE ALSO PASSED AWAY FROM DM  . Cardiomyopathy Mother     History  Substance Use Topics  . Smoking status: Never Smoker   . Smokeless tobacco: Not on file  . Alcohol Use: No      Review of Systems  Constitutional: Positive for diaphoresis and fatigue. Negative for fever, chills, activity change and appetite change.  HENT: Negative.   Eyes: Negative.   Respiratory: Positive for chest tightness. Negative for cough, shortness of breath and wheezing.   Cardiovascular: Positive for chest pain and palpitations. Negative for leg swelling.  Gastrointestinal: Positive for nausea and vomiting. Negative for abdominal pain, diarrhea and constipation.  Genitourinary: Negative for dysuria and difficulty urinating.  Musculoskeletal: Negative.  Negative for myalgias, arthralgias and gait problem.  Skin: Positive for wound (small burn on left lower leg.  the blister came off tonight in the shower.). Negative for color change and rash.  Neurological: Positive for dizziness and tremors. Negative for syncope, weakness, light-headedness and headaches.  Psychiatric/Behavioral: The patient is nervous/anxious.     Allergies  Nsaids  Home Medications   Current Outpatient Rx  Name  Route  Sig  Dispense  Refill  . CARVEDILOL 6.25 MG PO TABS   Oral   Take 9.375 mg by mouth 2 (two) times daily with a meal.         . DIGOXIN 0.25 MG PO TABS   Oral   Take 1 tablet (250 mcg total) by mouth daily.   30 tablet   6   . METFORMIN HCL 1000 MG PO TABS   Oral   Take 1 tablet (1,000 mg total) by mouth 2 (two) times daily.   60 tablet   5   . QUETIAPINE FUMARATE 100 MG PO TABS   Oral   Take 100 mg by mouth at bedtime.         . SERTRALINE HCL 100 MG PO TABS   Oral   Take 100 mg by mouth 2 (two) times daily.            [name] Kitchen SPIRONOLACTONE 25 MG PO TABS   Oral   Take 1 tablet (25 mg total) by mouth daily.   30 tablet   6     BP 150/90  Pulse 118  Temp 98.6 F (37 C) (Oral)  Resp 24  Ht 6' (1.829 m)  Wt 275 lb (124.739 kg)  BMI 37.30 kg/m2  SpO2 100%  Physical Exam  Nursing note and vitals reviewed. Constitutional: He is oriented to person, place, and time. He appears well-nourished. No distress. He is not intubated.       Pt is morbidly obese  HENT:  Head: Normocephalic and atraumatic.  Right Ear: External ear normal.  Left Ear: External ear normal.  Nose: Nose normal.  Mouth/Throat: Oropharynx is clear and moist. No oropharyngeal exudate.       Poor dentition with numerous decayed teeth   Eyes: Conjunctivae normal are normal. Pupils are equal, round, and reactive to light. Right eye exhibits no discharge. Left eye exhibits no discharge. No scleral icterus.  Neck: Normal range of motion. Neck supple. No JVD present. No tracheal deviation present.  Cardiovascular: Regular rhythm, normal heart sounds, intact distal pulses and normal pulses.   No extrasystoles are present. Tachycardia present.  PMI is displaced.  Exam reveals no gallop and no decreased pulses.   No murmur heard. Pulmonary/Chest: Effort normal. No accessory muscle usage or stridor. No apnea, not tachypneic and not bradypneic. He is not intubated. No respiratory distress. He has no decreased breath sounds. He has no wheezes. He has no rhonchi. He has no rales. He exhibits no tenderness.  Abdominal: Soft. Bowel sounds are normal. He exhibits no distension and no mass. There is no tenderness. There is no rebound and no guarding.       Protuberant   Musculoskeletal: Normal range of motion. He exhibits no edema and no tenderness.  Lymphadenopathy:    He has no cervical adenopathy.  Neurological: He is alert and oriented to person, place, and time. No cranial nerve deficit.  Skin: Skin is warm and dry. No rash noted. He is not  diaphoretic. No erythema. No pallor.  Psychiatric: He has a normal mood and affect. His behavior is normal.    ED Course  Procedures (including critical care time)   Labs Reviewed  CBC  BASIC METABOLIC PANEL  PRO B NATRIURETIC PEPTIDE  TROPONIN I  DIGOXIN LEVEL   Dg Chest Port 1 View  08/17/2012  *RADIOLOGY REPORT*  Clinical Data: Chest pain.  Shortness of breath.  Weakness.  PORTABLE CHEST - 1 VIEW  Comparison:  09/22/2011  Findings: The shallow inspiration.  Cardiac enlargement with mild pulmonary vascular congestion.  No edema or consolidation.  No blunting of costophrenic angles.  No pneumothorax.  Mediastinal contours appear intact.  IMPRESSION: Cardiac enlargement with mild pulmonary vascular congestion.  No edema or consolidation.   Original Report Authenticated By: Burman Nieves, M.D.      No diagnosis found.   Date: 08/16/2012 @ 2325  Rate: 123 bpm  Rhythm: sinus tachycardia  QRS Axis: left  Intervals: normal  ST/T Wave abnormalities: normal  Conduction Disutrbances:left anterior fascicular block, LVH  Narrative Interpretation:   Old EKG Reviewed: unchanged      MDM  Pt presents for evaluation of CP, diaphoresis, vomiting after taking an OTC supplement, "StaminaX".  He appears nontoxic, note elevated HR and BP, NAD.  He has a hx of cardiomyopathy and DM.  Bernerd Limbo is advertised as a male enhancement supplement to boost sexual performance.  He reports the symptoms began almost immediately after taking 2 pills.  Will obtain and EKG, CXR, basic labs, digoxin level, trop, and pBNP.  Will review the results as available.  0540.  Pt stable, NAD.  He denies pain and reports that he has been symptom free since prior to my initial evaluation.  His BP and HR have been elevated since arriving.  I reviewed previous ER and clinic evaluations and noticed he has had chronic issues with both.  He denies dyspnea and on reassessment was sleeping soundly laying flat on the stretcher.   No respiratory insufficiency observed.  He also has stable O2 sat.  Plan d/c home.  I strongly encouraged hm to follow-up with his cardiologist and to reestablish care with a local PMD.  I also advised him not to consume any OTC supplements, enhancements, or stimulants without first consulting with his cardiologist as to the safety of using the product.  He has been advised to return to the ER if he experiences CP, SOB, diaphoresis, CP associated with syncope, leg pain/swelling, palpitations, or vomiting.        Tobin Chad, MD 08/17/12 [phone]

## 2012-08-17 NOTE — ED Notes (Signed)
Pt resting quietly/ comfortably with family at bedside.  Reports sx resolving.  Pt continues to wait to be evaluated by ED provider.  NAD.

## 2012-08-26 ENCOUNTER — Encounter (HOSPITAL_COMMUNITY): Payer: Self-pay

## 2012-08-26 ENCOUNTER — Ambulatory Visit (HOSPITAL_COMMUNITY)
Admission: RE | Admit: 2012-08-26 | Discharge: 2012-08-26 | Disposition: A | Discharge status: Home / Self Care | Payer: No Typology Code available for payment source | Source: Ambulatory Visit | Attending: Internal Medicine | Admitting: Internal Medicine

## 2012-08-26 VITALS — BP 156/104 | HR 122 | Wt 288.0 lb

## 2012-08-26 DIAGNOSIS — I5022 Chronic systolic (congestive) heart failure: Secondary | ICD-10-CM | POA: Insufficient documentation

## 2012-08-26 NOTE — Assessment & Plan Note (Addendum)
Missed last appointment. NYHA II. Volume status stable. Very difficult to tell which medications he is actually taking because he says that he is out of some medications and taking some medications every now and then. I have asked him to call back to provide an accurate list of medications therefore will not titrate HF medications. Will refer to paramedicine to assist with HF management. Follow up in 2 weeks. Will need to reschedule ECHO.

## 2012-08-26 NOTE — Progress Notes (Signed)
Patient ID: Mitchell [name], male   DOB: 02-19-1971, 42 y.o.   MRN: [id number] PCP: None ? [facility]: Mitchell [name]   HPI:  Mitchell [name] is a 42 year old male with multiple medical problems including obesity, uncontrolled hypertension, uncontrolled diabetes, obstructive sleep apnea, bipolar disease/schizophrenia, CVA, nonobstructive coronary disease and systolic congestive heart failure secondary to nonischemic cardiomyopathy.  He has had several readmissions due to running out of his medications.  He recently was hospitalized for this problem with a 12 pound weight gain.  He was diuresed to 273 pounds  09/23/11: 2D echo checked with reduction in LVEF to 15-20% with mild to moderate mitral valve regurgitation  Evaluated in ED for 08/16/12 for heart racing due to OTC "Stamina X" for to improve sexual performance.   He returns for follow up today.  Denies SOB/PND/Orthopnea. Able to walk up 3 flights of stairs but has to rest. Did not take medications today. Has been out of carvedilol for over 2-3 months. Only taking Digoxin 1-2 times a week. Only staking Spironolactone 2 times a week. Very difficult to tell which medications he actually has at home.  Weight at home 280 pounds. Not using CPAP but wants another machine.    ROS: As per HPI otherwise negative  Past Medical History  Diagnosis Date  . Hypertension   . Hyperlipidemia   . Depression   . OSA (obstructive sleep apnea)     On CPAP; NON-COMPLIANT  . CVA (cerebral infarction)     No residual deficits  . Non-ischemic cardiomyopathy     No ischemia on myoview, showed EF of 42%. 2D echo showed EF 50-55% with diatolic dysfunction in 2009  . Post-cardiac injury syndrome     History of cardiac injury from blunt trauma  . PVCs (premature ventricular contractions)     Followed by [facility].   [name] Kitchen History of colonic polyps   . Obesity   . Syncope     Recurrent, thought to be vasovagal. Also has h/o frequent PVCs.   . CHF  (congestive heart failure) 11/2010    ACUTE SYSTOLIC CHF, THIS IS A NEW DX  . SOB (shortness of breath)   . Stroke   . 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 Myocardial infarction   . Headache   . Diabetes mellitus     TYPE II; UNCONTROLLED BY HEMOGLOBIN A1c; STABLE AS  PER DISCHARGE    Current Outpatient Prescriptions  Medication Sig Dispense Refill  . carvedilol (COREG) 6.25 MG tablet Take 9.375 mg by mouth 2 (two) times daily with a meal.      . digoxin (LANOXIN) 0.25 MG tablet Take 1 tablet (250 mcg total) by mouth daily.  30 tablet  6  . metFORMIN (GLUCOPHAGE) 1000 MG tablet Take 1 tablet (1,000 mg total) by mouth 2 (two) times daily.  60 tablet  5  . QUEtiapine (SEROQUEL) 100 MG tablet Take 100 mg by mouth at bedtime.      . sertraline (ZOLOFT) 100 MG tablet Take 100 mg by mouth 2 (two) times daily.       [name] Kitchen spironolactone (ALDACTONE) 25 MG tablet Take 1 tablet (25 mg total) by mouth daily.  30 tablet  6    Allergies  Allergen Reactions  . Nsaids Hives    PHYSICAL EXAM: Filed Vitals:   08/26/12 1056  BP: 156/104  Pulse: 122  Weight: 288 lb (130.636 kg)  SpO2: 98%   General:  Well appearing. No respiratory difficulty HEENT: normal Neck:  supple. JVP 5-6. Carotids 2+ bilat; no bruits. No lymphadenopathy or thryomegaly appreciated. Cor: PMI nondisplaced. Tachycardic & regular rhythm. No rubs, or murmurs. S3 Lungs: clear Abdomen: soft, nontender, distended. No hepatosplenomegaly. No bruits or masses. Good bowel sounds. Extremities: no cyanosis, clubbing, rash, edema Neuro: alert & oriented x 3, cranial nerves grossly intact. moves all 4 extremities w/o difficulty. Affect pleasant.    ASSESSMENT & PLAN:

## 2012-08-26 NOTE — Assessment & Plan Note (Signed)
Will refer to pulmonary for sleep study.

## 2012-08-26 NOTE — Patient Instructions (Addendum)
Follow up in 2 weeks  Do the following things EVERYDAY: 1) Weigh yourself in the morning before breakfast. Write it down and keep it in a log. 2) Take your medicines as prescribed 3) Eat low salt foods--Limit salt (sodium) to 2000 mg per day.  4) Stay as active as you can everyday 5) Limit all fluids for the day to less than 2 liters 

## 2012-08-27 NOTE — Addendum Note (Signed)
Encounter addended by: Deitra Mayo on: 08/27/2012  9:57 AM<BR>     Documentation filed: Charges VN

## 2012-09-13 ENCOUNTER — Ambulatory Visit (HOSPITAL_COMMUNITY): Admission: RE | Admit: 2012-09-13 | Payer: No Typology Code available for payment source | Source: Ambulatory Visit

## 2012-09-24 ENCOUNTER — Ambulatory Visit (HOSPITAL_COMMUNITY)
Admission: RE | Admit: 2012-09-24 | Discharge: 2012-09-24 | Disposition: A | Discharge status: Home / Self Care | Payer: No Typology Code available for payment source | Source: Ambulatory Visit | Attending: Internal Medicine | Admitting: Internal Medicine

## 2012-09-24 VITALS — BP 158/110 | HR 106 | Wt 287.0 lb

## 2012-09-24 DIAGNOSIS — G4733 Obstructive sleep apnea (adult) (pediatric): Secondary | ICD-10-CM | POA: Insufficient documentation

## 2012-09-24 DIAGNOSIS — I5022 Chronic systolic (congestive) heart failure: Secondary | ICD-10-CM | POA: Insufficient documentation

## 2012-09-24 NOTE — Progress Notes (Signed)
Patient ID: Mitchell [name], male   DOB: [birth year], 42 y.o.   MRN: [id number] PCP: None: None Mental health: [facility]  Pulmonologist: None  HPI:  Mitchell [name] is a 42 year old male with multiple medical problems including obesity, uncontrolled hypertension, uncontrolled diabetes, obstructive sleep apnea, bipolar disease/schizophrenia, CVA, nonobstructive coronary disease and systolic congestive heart failure secondary to nonischemic cardiomyopathy.  09/23/11: 2D echo checked with reduction in LVEF to 15-20% with mild to moderate mitral valve regurgitation  Evaluated in ED for 08/16/12 for heart racing due to OTC "Stamina X" for to improve sexual performance.   He returns for follow up today.  Denies SOB/PND/Orthopnea. Able to walk up 3 flights of stairs but has to rest. Did not take medications today. He had been out of digoxin and spironolactone and just got it filled today.  Weight at home on 09/18/12 269 pounds. CPAP machine broken. Followed by para-medicine.    ROS: As per HPI otherwise negative  Past Medical History  Diagnosis Date  . Hypertension   . Hyperlipidemia   . Depression   . OSA (obstructive sleep apnea)     On CPAP; NON-COMPLIANT  . CVA (cerebral infarction)     No residual deficits  . Non-ischemic cardiomyopathy     No ischemia on myoview, showed EF of 42%. 2D echo showed EF 50-55% with diatolic dysfunction in 2009  . Post-cardiac injury syndrome     History of cardiac injury from blunt trauma  . PVCs (premature ventricular contractions)     Followed by [facility].   [name] Kitchen History of colonic polyps   . Obesity   . Syncope     Recurrent, thought to be vasovagal. Also has h/o frequent PVCs.   . CHF (congestive heart failure) 11/2010    ACUTE SYSTOLIC CHF, THIS IS A NEW DX  . SOB (shortness of breath)   . Stroke   . 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 Myocardial infarction   . Headache   . Diabetes mellitus     TYPE II; UNCONTROLLED BY HEMOGLOBIN A1c; STABLE AS   PER DISCHARGE    Current Outpatient Prescriptions  Medication Sig Dispense Refill  . carvedilol (COREG) 6.25 MG tablet Take 9.375 mg by mouth 2 (two) times daily with a meal.      . digoxin (LANOXIN) 0.25 MG tablet Take 1 tablet (250 mcg total) by mouth daily.  30 tablet  6  . furosemide (LASIX) 40 MG tablet Take 40 mg by mouth 2 (two) times daily.      . metFORMIN (GLUCOPHAGE) 1000 MG tablet Take 1 tablet (1,000 mg total) by mouth 2 (two) times daily.  60 tablet  5  . QUEtiapine (SEROQUEL) 100 MG tablet Take 100 mg by mouth at bedtime.      . sertraline (ZOLOFT) 100 MG tablet Take 100 mg by mouth 2 (two) times daily.       [name] Kitchen spironolactone (ALDACTONE) 25 MG tablet Take 1 tablet (25 mg total) by mouth daily.  30 tablet  6   No current facility-administered medications for this encounter.    Allergies  Allergen Reactions  . Nsaids Hives    PHYSICAL EXAM: Filed Vitals:   09/24/12 1458  BP: 158/110  Pulse: 106  Weight: 287 lb (130.182 kg)  SpO2: 100%  Weight 288>287 pounds General:  Well appearing. No respiratory difficulty HEENT: normal Neck: supple. JVP 5-6. Carotids 2+ bilat; no bruits. No lymphadenopathy or thryomegaly appreciated. Cor: PMI nondisplaced. Tachycardic & regular rhythm. No  rubs, or murmurs. S3 Lungs: clear Abdomen: soft, nontender, distended. No hepatosplenomegaly. No bruits or masses. Good bowel sounds. Extremities: no cyanosis, clubbing, rash, edema Neuro: alert & oriented x 3, cranial nerves grossly intact. moves all 4 extremities w/o difficulty. Affect pleasant.    ASSESSMENT & PLAN:

## 2012-09-24 NOTE — Assessment & Plan Note (Signed)
Refer to pulmonary for sleep evaluation. He has used CPAP in past.

## 2012-09-24 NOTE — Assessment & Plan Note (Signed)
NYHA II. Volume status stable. Out of HF medications for the last week but plans to restart them today . Will not titrate HF medications today. Follow up in 1 month with an ECHO. Continue paramedicine to assist with ongoing compliance.

## 2012-09-24 NOTE — Patient Instructions (Addendum)
Follow up in 1 month with an ECHO and Dr Gala Romney  Do the following things EVERYDAY: 1) Weigh yourself in the morning before breakfast. Write it down and keep it in a log. 2) Take your medicines as prescribed 3) Eat low salt foods-Limit salt (sodium) to 2000 mg per day.  4) Stay as active as you can everyday 5) Limit all fluids for the day to less than 2 liters

## 2012-10-10 ENCOUNTER — Institutional Professional Consult (permissible substitution): Payer: No Typology Code available for payment source | Admitting: Pulmonary Disease

## 2012-10-30 ENCOUNTER — Ambulatory Visit (HOSPITAL_COMMUNITY)
Admission: RE | Admit: 2012-10-30 | Discharge: 2012-10-30 | Disposition: A | Discharge status: Home / Self Care | Payer: No Typology Code available for payment source | Source: Ambulatory Visit | Attending: Internal Medicine | Admitting: Internal Medicine

## 2012-10-30 ENCOUNTER — Ambulatory Visit (HOSPITAL_BASED_OUTPATIENT_CLINIC_OR_DEPARTMENT_OTHER)
Admission: RE | Admit: 2012-10-30 | Discharge: 2012-10-30 | Disposition: A | Discharge status: Home / Self Care | Payer: No Typology Code available for payment source | Source: Ambulatory Visit | Attending: Internal Medicine | Admitting: Internal Medicine

## 2012-10-30 VITALS — BP 128/88 | HR 86 | Wt 271.0 lb

## 2012-10-30 DIAGNOSIS — I369 Nonrheumatic tricuspid valve disorder, unspecified: Secondary | ICD-10-CM

## 2012-10-30 DIAGNOSIS — I379 Nonrheumatic pulmonary valve disorder, unspecified: Secondary | ICD-10-CM | POA: Insufficient documentation

## 2012-10-30 DIAGNOSIS — E785 Hyperlipidemia, unspecified: Secondary | ICD-10-CM | POA: Insufficient documentation

## 2012-10-30 DIAGNOSIS — E119 Type 2 diabetes mellitus without complications: Secondary | ICD-10-CM | POA: Insufficient documentation

## 2012-10-30 DIAGNOSIS — I4949 Other premature depolarization: Secondary | ICD-10-CM | POA: Insufficient documentation

## 2012-10-30 DIAGNOSIS — I252 Old myocardial infarction: Secondary | ICD-10-CM | POA: Insufficient documentation

## 2012-10-30 DIAGNOSIS — I5022 Chronic systolic (congestive) heart failure: Secondary | ICD-10-CM | POA: Insufficient documentation

## 2012-10-30 DIAGNOSIS — R55 Syncope and collapse: Secondary | ICD-10-CM | POA: Insufficient documentation

## 2012-10-30 DIAGNOSIS — I059 Rheumatic mitral valve disease, unspecified: Secondary | ICD-10-CM | POA: Insufficient documentation

## 2012-10-30 DIAGNOSIS — I10 Essential (primary) hypertension: Secondary | ICD-10-CM | POA: Insufficient documentation

## 2012-10-30 DIAGNOSIS — I079 Rheumatic tricuspid valve disease, unspecified: Secondary | ICD-10-CM | POA: Insufficient documentation

## 2012-10-30 DIAGNOSIS — G4730 Sleep apnea, unspecified: Secondary | ICD-10-CM | POA: Insufficient documentation

## 2012-10-30 MED ORDER — DIGOXIN 250 MCG PO TABS: 0.2500 mg | ORAL_TABLET | Freq: Every day | ORAL | Status: DC

## 2012-10-30 MED ORDER — LISINOPRIL 10 MG PO TABS: 10.0000 mg | ORAL_TABLET | Freq: Every day | ORAL | Status: DC

## 2012-10-30 NOTE — Assessment & Plan Note (Addendum)
NYHA II. Volume status stable. Continue current diuretic regimen. Add lisinopril 10 mg daily. ECHO reviewed and discussed by Dr Gala Romney today. EF remains < 35%. Offered referral to EP for ICD however he would like to hold off. Reinforced daily weights, medication compliance, limiting fluid intake < 2 liters. Check BMET next week. Follow up in 3-4 weeks.   Patient seen and examined with Tonye Becket, NP. We discussed all aspects of the encounter. I agree with the assessment and plan as stated above. Symptomatically much improved now NYHA II. He is much more compliant with meds and daily weights. I reviewed echo images today in clinic and unfortunately EF remains depressed at 25-30%. We had long talk about risks of SCD and role of ICD. He wants to talk with his family. Reinforced need for daily weights and reviewed use of sliding scale diuretics. Add lisinopril 10 daily. Labs next week. OSA being worked up.

## 2012-10-30 NOTE — Progress Notes (Signed)
Patient ID: Mitchell [name], male   DOB: [birth year], 42 y.o.   MRN: [id number] PCP: None: None Mental Health: [facility]  Pulmonologist: None  HPI:  Mitchell [name] is a 42 year old male with multiple medical problems including obesity, uncontrolled hypertension, uncontrolled diabetes, obstructive sleep apnea, bipolar disease/schizophrenia, CVA, nonobstructive coronary disease and systolic congestive heart failure secondary to nonischemic cardiomyopathy.  09/23/11: 2D echo checked with reduction in LVEF to 15-20% with mild to moderate mitral valve regurgitation  ECHO 10/30/12 EF 20-25%    Evaluated in ED for 08/16/12 for heart racing due to OTC "Stamina X" for to improve sexual performance.   He returns for follow up today.  His mother just passed. Overall feeling better. Denies SOB/PND/Orthopnea. Able to walk up steps. Weight at home 267-270 pounds. Followed by para medicine.Reviewed para medicine progress notes (BP 132/64 weight 271 pounds 10/29/12) CPAP has been broken. Trying to exercise. Drinking less than 2 liters per day.      ROS: As per HPI otherwise negative  Past Medical History  Diagnosis Date  . Hypertension   . Hyperlipidemia   . Depression   . OSA (obstructive sleep apnea)     On CPAP; NON-COMPLIANT  . CVA (cerebral infarction)     No residual deficits  . Non-ischemic cardiomyopathy     No ischemia on myoview, showed EF of 42%. 2D echo showed EF 50-55% with diatolic dysfunction in 2009  . Post-cardiac injury syndrome     History of cardiac injury from blunt trauma  . PVCs (premature ventricular contractions)     Followed by [facility].   [name] Kitchen History of colonic polyps   . Obesity   . Syncope     Recurrent, thought to be vasovagal. Also has h/o frequent PVCs.   . CHF (congestive heart failure) 11/2010    ACUTE SYSTOLIC CHF, THIS IS A NEW DX  . SOB (shortness of breath)   . Stroke   . 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 Myocardial infarction   . Headache   . Diabetes  mellitus     TYPE II; UNCONTROLLED BY HEMOGLOBIN A1c; STABLE AS  PER DISCHARGE    Current Outpatient Prescriptions  Medication Sig Dispense Refill  . carvedilol (COREG) 6.25 MG tablet Take 9.375 mg by mouth 2 (two) times daily with a meal.      . digoxin (LANOXIN) 0.25 MG tablet Take 1 tablet (250 mcg total) by mouth daily.  30 tablet  6  . furosemide (LASIX) 40 MG tablet Take 40 mg by mouth 2 (two) times daily.      . metFORMIN (GLUCOPHAGE) 1000 MG tablet Take 1 tablet (1,000 mg total) by mouth 2 (two) times daily.  60 tablet  5  . QUEtiapine (SEROQUEL) 100 MG tablet Take 100 mg by mouth at bedtime.      . sertraline (ZOLOFT) 100 MG tablet Take 100 mg by mouth 2 (two) times daily.       [name] Kitchen spironolactone (ALDACTONE) 25 MG tablet Take 1 tablet (25 mg total) by mouth daily.  30 tablet  6   No current facility-administered medications for this encounter.    Allergies  Allergen Reactions  . Nsaids Hives    PHYSICAL EXAM: Filed Vitals:   10/30/12 1116  BP: 128/88  Pulse: 86  Weight: 271 lb (122.925 kg)  SpO2: 99%  Weight 288>287 >271 pounds General:  Well appearing. No respiratory difficulty HEENT: normal Neck: supple. JVP 5-6. Carotids 2+ bilat; no bruits. No lymphadenopathy or  thryomegaly appreciated. Cor: PMI nondisplaced. Tachycardic & regular rhythm. No rubs, or murmurs. S3 Lungs: clear Abdomen: soft, nontender, distended. No hepatosplenomegaly. No bruits or masses. Good bowel sounds. Extremities: no cyanosis, clubbing, rash, edema Neuro: alert & oriented x 3, cranial nerves grossly intact. moves all 4 extremities w/o difficulty. Affect pleasant.    ASSESSMENT & PLAN:

## 2012-10-30 NOTE — Progress Notes (Signed)
*  PRELIMINARY RESULTS* Echocardiogram 2D Echocardiogram has been performed.  Mitchell [name] 10/30/2012, 11:37 AM

## 2012-10-30 NOTE — Patient Instructions (Addendum)
Take lisinopril 10 mg daily  Follow up in 1 month  Do the following things EVERYDAY: 1) Weigh yourself in the morning before breakfast. Write it down and keep it in a log. 2) Take your medicines as prescribed 3) Eat low salt foods-Limit salt (sodium) to 2000 mg per day.  4) Stay as active as you can everyday 5) Limit all fluids for the day to less than 2 liters

## 2012-11-04 ENCOUNTER — Institutional Professional Consult (permissible substitution): Payer: No Typology Code available for payment source | Admitting: Pulmonary Disease

## 2012-11-29 ENCOUNTER — Other Ambulatory Visit (HOSPITAL_COMMUNITY): Payer: Self-pay | Admitting: Physician Assistant

## 2012-11-29 DIAGNOSIS — I5022 Chronic systolic (congestive) heart failure: Secondary | ICD-10-CM

## 2012-11-29 NOTE — Telephone Encounter (Signed)
Pt called to request refill on meds 

## 2012-12-03 ENCOUNTER — Other Ambulatory Visit (HOSPITAL_COMMUNITY): Payer: Self-pay | Admitting: *Deleted

## 2012-12-03 MED ORDER — SPIRONOLACTONE 25 MG PO TABS: 25.0000 mg | ORAL_TABLET | Freq: Every day | ORAL | Status: DC

## 2012-12-05 ENCOUNTER — Ambulatory Visit (HOSPITAL_COMMUNITY)
Admission: RE | Admit: 2012-12-05 | Discharge: 2012-12-05 | Disposition: A | Discharge status: Home / Self Care | Payer: No Typology Code available for payment source | Source: Ambulatory Visit | Attending: Internal Medicine | Admitting: Internal Medicine

## 2012-12-05 VITALS — BP 142/90 | HR 108 | Wt 269.2 lb

## 2012-12-05 DIAGNOSIS — I4949 Other premature depolarization: Secondary | ICD-10-CM | POA: Insufficient documentation

## 2012-12-05 DIAGNOSIS — Z79899 Other long term (current) drug therapy: Secondary | ICD-10-CM | POA: Insufficient documentation

## 2012-12-05 DIAGNOSIS — I428 Other cardiomyopathies: Secondary | ICD-10-CM | POA: Insufficient documentation

## 2012-12-05 DIAGNOSIS — Z8673 Personal history of transient ischemic attack (TIA), and cerebral infarction without residual deficits: Secondary | ICD-10-CM | POA: Insufficient documentation

## 2012-12-05 DIAGNOSIS — G4733 Obstructive sleep apnea (adult) (pediatric): Secondary | ICD-10-CM | POA: Insufficient documentation

## 2012-12-05 DIAGNOSIS — I5022 Chronic systolic (congestive) heart failure: Secondary | ICD-10-CM | POA: Insufficient documentation

## 2012-12-05 DIAGNOSIS — E785 Hyperlipidemia, unspecified: Secondary | ICD-10-CM | POA: Insufficient documentation

## 2012-12-05 DIAGNOSIS — F3289 Other specified depressive episodes: Secondary | ICD-10-CM | POA: Insufficient documentation

## 2012-12-05 DIAGNOSIS — F319 Bipolar disorder, unspecified: Secondary | ICD-10-CM | POA: Insufficient documentation

## 2012-12-05 DIAGNOSIS — Z860100 Personal history of colon polyps, unspecified: Secondary | ICD-10-CM | POA: Insufficient documentation

## 2012-12-05 DIAGNOSIS — F329 Major depressive disorder, single episode, unspecified: Secondary | ICD-10-CM | POA: Insufficient documentation

## 2012-12-05 DIAGNOSIS — I059 Rheumatic mitral valve disease, unspecified: Secondary | ICD-10-CM | POA: Insufficient documentation

## 2012-12-05 DIAGNOSIS — IMO0001 Reserved for inherently not codable concepts without codable children: Secondary | ICD-10-CM | POA: Insufficient documentation

## 2012-12-05 DIAGNOSIS — B009 Herpesviral infection, unspecified: Secondary | ICD-10-CM | POA: Insufficient documentation

## 2012-12-05 DIAGNOSIS — I252 Old myocardial infarction: Secondary | ICD-10-CM | POA: Insufficient documentation

## 2012-12-05 DIAGNOSIS — I10 Essential (primary) hypertension: Secondary | ICD-10-CM | POA: Insufficient documentation

## 2012-12-05 DIAGNOSIS — E669 Obesity, unspecified: Secondary | ICD-10-CM | POA: Insufficient documentation

## 2012-12-05 DIAGNOSIS — I2510 Atherosclerotic heart disease of native coronary artery without angina pectoris: Secondary | ICD-10-CM | POA: Insufficient documentation

## 2012-12-05 MED ORDER — CARVEDILOL 12.5 MG PO TABS: 12.5000 mg | ORAL_TABLET | Freq: Two times a day (BID) | ORAL | Status: DC

## 2012-12-05 MED ORDER — LOSARTAN POTASSIUM 50 MG PO TABS: 50.0000 mg | ORAL_TABLET | Freq: Every day | ORAL | Status: DC

## 2012-12-05 NOTE — Assessment & Plan Note (Signed)
Will refer to Pulmonary for sleep study eval.

## 2012-12-05 NOTE — Progress Notes (Signed)
Patient ID: Mitchell [name], male   DOB: [birth year], 42 y.o.   MRN: [id number] PCP: None: None Mental Health: [facility]  Pulmonologist: None  HPI:  Mitchell [name] is a 42 year old male with multiple medical problems including obesity, uncontrolled hypertension, uncontrolled diabetes, obstructive sleep apnea, bipolar disease/schizophrenia, CVA, nonobstructive coronary disease and systolic congestive heart failure secondary to nonischemic cardiomyopathy.  09/23/11: 2D echo checked with reduction in LVEF to 15-20% with mild to moderate mitral valve regurgitation  ECHO 10/30/12 EF 20-25%   Follow up.  Being followed in paramedicne program. Has been out of spironolactone and just restarted yesterday. Says he is taking other meds as prescribed. Reports feeling good. Denies SOB, orthopnea, or PND. Lisinopril started last visit and reports severe dry cough. Weighing at home daily 267-269 lbs. CPAP still broken. Not following low salt diet. Drinking less that 2 L day. Can walk around all [employer] without stopping. Winded with stairs.   ROS: As per HPI otherwise negative  Past Medical History  Diagnosis Date  . Hypertension   . Hyperlipidemia   . Depression   . OSA (obstructive sleep apnea)     On CPAP; NON-COMPLIANT  . CVA (cerebral infarction)     No residual deficits  . Non-ischemic cardiomyopathy     No ischemia on myoview, showed EF of 42%. 2D echo showed EF 50-55% with diatolic dysfunction in 2009  . Post-cardiac injury syndrome     History of cardiac injury from blunt trauma  . PVCs (premature ventricular contractions)     Followed by [facility].   [name] Kitchen History of colonic polyps   . Obesity   . Syncope     Recurrent, thought to be vasovagal. Also has h/o frequent PVCs.   . CHF (congestive heart failure) 11/2010    ACUTE SYSTOLIC CHF, THIS IS A NEW DX  . SOB (shortness of breath)   . Stroke   . 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 Myocardial infarction   . Headache   . Diabetes mellitus      TYPE II; UNCONTROLLED BY HEMOGLOBIN A1c; STABLE AS  PER DISCHARGE    Current Outpatient Prescriptions  Medication Sig Dispense Refill  . carvedilol (COREG) 6.25 MG tablet Take 9.375 mg by mouth 2 (two) times daily with a meal.      . digoxin (LANOXIN) 0.25 MG tablet Take 1 tablet (0.25 mg total) by mouth daily.  30 tablet  6  . furosemide (LASIX) 40 MG tablet Take 40 mg by mouth 2 (two) times daily.      [name] Kitchen lisinopril (PRINIVIL) 10 MG tablet Take 1 tablet (10 mg total) by mouth daily.  30 tablet  3  . metFORMIN (GLUCOPHAGE) 1000 MG tablet Take 1 tablet (1,000 mg total) by mouth 2 (two) times daily.  60 tablet  5  . QUEtiapine (SEROQUEL) 100 MG tablet Take 100 mg by mouth at bedtime.      . sertraline (ZOLOFT) 100 MG tablet Take 100 mg by mouth 2 (two) times daily.       [name] Kitchen spironolactone (ALDACTONE) 25 MG tablet Take 1 tablet (25 mg total) by mouth daily.  30 tablet  6   No current facility-administered medications for this encounter.    Allergies  Allergen Reactions  . Nsaids Hives    PHYSICAL EXAM: Filed Vitals:   12/05/12 1542  BP: 142/90  Pulse: 108  Weight: 269 lb 4 oz (122.131 kg)  SpO2: 99%    General:  Well appearing. No respiratory  difficulty HEENT: normal except for poor dentition Neck: supple. JVP hard to see. Carotids 2+ bilat; no bruits. No lymphadenopathy or thryomegaly appreciated. Cor: PMI nondisplaced. Regular rate rhythm. No rubs, or murmurs. No S3 Lungs: clear Abdomen: soft, obese nontender, nondistended. No hepatosplenomegaly. No bruits or masses. Good bowel sounds. Extremities: no cyanosis, clubbing, rash, edema Neuro: alert & oriented x 3, cranial nerves grossly intact. moves all 4 extremities w/o difficulty. Affect pleasant.    ASSESSMENT & PLAN:

## 2012-12-05 NOTE — Assessment & Plan Note (Signed)
Patient seen and examined with Ulla Potash, NP. We discussed all aspects of the encounter. I agree with the assessment and plan as stated above.   Attending: Improving slowly. NYHA II-III. Volume status looks good. Will increase carvedilol to 12.5 bid. Switch lisinopril to losartan 50 daily. Reinforced need for daily weights and reviewed use of sliding scale diuretics. Discussed need for ICD including risks and benefits. They want to think about it. He is aware that he is at risk for SCD in interim.

## 2012-12-05 NOTE — Patient Instructions (Addendum)
Stop taking lisinopril.  Start taking losartan 50 mg daily.  Increase carvedilol to 12.5 mg twice a day. Will take 2 pills am and pm until pick up new prescription.  Follow up 6 weeks.

## 2012-12-05 NOTE — Addendum Note (Signed)
Encounter addended by: Aundria Rud, NP on: 12/05/2012  4:20 PM<BR>     Documentation filed: Medications, Patient Instructions Section, Orders

## 2012-12-05 NOTE — Assessment & Plan Note (Signed)
BP slightly elevated. Titrating meds.

## 2012-12-05 NOTE — Addendum Note (Signed)
Encounter addended by: Noralee Space, RN on: 12/05/2012  4:22 PM<BR>     Documentation filed: Orders

## 2012-12-06 ENCOUNTER — Institutional Professional Consult (permissible substitution): Payer: No Typology Code available for payment source | Admitting: Pulmonary Disease

## 2012-12-30 ENCOUNTER — Institutional Professional Consult (permissible substitution): Payer: No Typology Code available for payment source | Admitting: Pulmonary Disease

## 2013-01-08 ENCOUNTER — Ambulatory Visit (HOSPITAL_COMMUNITY): Payer: No Typology Code available for payment source | Attending: Internal Medicine

## 2013-01-08 ENCOUNTER — Encounter (HOSPITAL_COMMUNITY): Payer: Self-pay | Admitting: *Deleted

## 2013-02-06 ENCOUNTER — Other Ambulatory Visit: Payer: Self-pay

## 2013-02-21 ENCOUNTER — Emergency Department (INDEPENDENT_AMBULATORY_CARE_PROVIDER_SITE_OTHER)
Admission: EM | Admit: 2013-02-21 | Discharge: 2013-02-21 | Disposition: A | Discharge status: Home / Self Care | Payer: No Typology Code available for payment source | Source: Home / Self Care

## 2013-02-21 ENCOUNTER — Encounter (HOSPITAL_COMMUNITY): Payer: Self-pay

## 2013-02-21 DIAGNOSIS — I10 Essential (primary) hypertension: Secondary | ICD-10-CM

## 2013-02-21 DIAGNOSIS — L738 Other specified follicular disorders: Secondary | ICD-10-CM

## 2013-02-21 MED ORDER — TRIAMCINOLONE ACETONIDE 0.5 % EX OINT: TOPICAL_OINTMENT | Freq: Two times a day (BID) | CUTANEOUS | Status: DC

## 2013-02-21 MED ORDER — MUPIROCIN 2 % EX OINT: TOPICAL_OINTMENT | Freq: Three times a day (TID) | CUTANEOUS | Status: DC

## 2013-02-21 NOTE — ED Notes (Signed)
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

## 2013-02-21 NOTE — ED Provider Notes (Signed)
CSN: [id number]     Arrival date & time 02/21/13  4098 History     None    Chief Complaint  Patient presents with  . Rash   (Consider location/radiation/quality/duration/timing/severity/associated sxs/prior Treatment) HPI Comments: 42 year old male with history of diabetes, hypertension and CHF. 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 Reports burning sensation and race bumps in the skin of his face. Patient is using a razor bump ease solution that was given to him at the barber school. He's also found to be incidentally hypertensive states he has not taken his blood pressure medications today as he has not had breakfast yet and usually takes and closed in on time. Denies headache, visual changes, chest pain, leg swelling or PND. He has a followup appointment with Dr. Teena Dunk on the first week of next month at the CHF clinic.    Past Medical History  Diagnosis Date  . Hypertension   . Hyperlipidemia   . Depression   . OSA (obstructive sleep apnea)     On CPAP; NON-COMPLIANT  . CVA (cerebral infarction)     No residual deficits  . Non-ischemic cardiomyopathy     No ischemia on myoview, showed EF of 42%. 2D echo showed EF 50-55% with diatolic dysfunction in 2009  . Post-cardiac injury syndrome     History of cardiac injury from blunt trauma  . PVCs (premature ventricular contractions)     Followed by [facility]iology.   [name] Kitchen History of colonic polyps   . Obesity   . Syncope     Recurrent, thought to be vasovagal. Also has h/o frequent PVCs.   . CHF (congestive heart failure) 11/2010    ACUTE SYSTOLIC CHF, THIS IS A NEW DX  . SOB (shortness of breath)   . Stroke   . 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 Myocardial infarction   . Headache(784.0)   . Diabetes mellitus     TYPE II; UNCONTROLLED BY HEMOGLOBIN A1c; STABLE AS  PER DISCHARGE   Past Surgical History  Procedure Laterality Date  . Cardiac  catheterization  12/19/10    DIFFUSE NONOBSTRUCTIVE CAD; NONISCHEMIC CARDIOMYOPATHY; LEFT VENTRICULAR ANGIOGRAM WAS PERFORMED SECONDARY TO  ELEVATED LEFT VENTRICULAR FILLING PRESSURES  . Right thumb     Family History  Problem Relation Age of Onset  . Heart disease Mother     MI  . Heart failure Mother   . Diabetes Mother     ALSO IN MOST OF HIS SIBLINGS; 2 UNLCES HAVE ALSO PASSED AWAY FROM DM  . Cardiomyopathy Mother    History  Substance Use Topics  . Smoking status: Never Smoker   . Smokeless tobacco: Not on file  . Alcohol Use: No    Review of Systems  Constitutional: Negative for fever and chills.  HENT: Negative for neck pain.   Eyes: Negative for visual disturbance.  Gastrointestinal: Negative for nausea, vomiting, abdominal pain and diarrhea.  Endocrine: Negative for polydipsia, polyphagia and polyuria.  Skin: Positive for rash.  Neurological: Negative for dizziness, seizures, syncope, speech difficulty, weakness, numbness and headaches.    Allergies  Nsaids  Home Medications   Current Outpatient Rx  Name  Route  Sig  Dispense  Refill  . metFORMIN (GLUCOPHAGE) 1000 MG tablet   Oral   Take 1 tablet (1,000 mg total) by mouth 2 (two) times daily.   60 tablet   5   . QUEtiapine (  SEROQUEL) 100 MG tablet   Oral   Take 100 mg by mouth at bedtime.         . carvedilol (COREG) 12.5 MG tablet   Oral   Take 1 tablet (12.5 mg total) by mouth 2 (two) times daily with a meal.   60 tablet   3   . digoxin (LANOXIN) 0.25 MG tablet   Oral   Take 1 tablet (0.25 mg total) by mouth daily.   30 tablet   6   . furosemide (LASIX) 40 MG tablet   Oral   Take 40 mg by mouth 2 (two) times daily.         [name] Kitchen losartan (COZAAR) 50 MG tablet   Oral   Take 1 tablet (50 mg total) by mouth daily.   30 tablet   3   . mupirocin ointment (BACTROBAN) 2 %   Topical   Apply topically 3 (three) times daily.   22 g   0   . sertraline (ZOLOFT) 100 MG tablet   Oral   Take 100  mg by mouth 2 (two) times daily.          [name] Kitchen spironolactone (ALDACTONE) 25 MG tablet   Oral   Take 1 tablet (25 mg total) by mouth daily.   30 tablet   6   . triamcinolone ointment (KENALOG) 0.5 %   Topical   Apply topically 2 (two) times daily.   30 g   0    BP 156/105  Pulse 103  Temp(Src) 98.6 F (37 C) (Oral)  Resp 16  SpO2 99% Physical Exam  Nursing note and vitals reviewed. Constitutional: He is oriented to person, place, and time. He appears well-developed and well-nourished. No distress.  HENT:  Head: Normocephalic and atraumatic.  Right Ear: External ear normal.  Left Ear: External ear normal.  Mouth/Throat: Oropharynx is clear and moist.  Neck: Neck supple.  Cardiovascular: Normal heart sounds.   Pulmonary/Chest: Breath sounds normal.  Lymphadenopathy:    He has no cervical adenopathy.  Neurological: He is alert and oriented to person, place, and time.  Skin: Rash noted. He is not diaphoretic.  Papuloerythematous rash in forehead and bilateral lower face associated with few micro pustules. No scabs. No fluctuations or indurations.     ED Course   Procedures (including critical care time)  Labs Reviewed - No data to display No results found. 1. Folliculitis barbae traumatica   2. Hypertension     MDM  Prescribed triamcinolone ointment and mupirocin ointment to use alternating. Asked patient to take his blood pressure medications as soon as he gets home and early in the morning every day and followup with his cardiologist as scheduled. Supportive care and red flags that should prompt his return to medical attention discussed with patient and provided in writing.  Sharin Grave, MD 02/23/13 [phone]

## 2013-03-04 ENCOUNTER — Encounter (HOSPITAL_COMMUNITY): Payer: Self-pay | Admitting: Anesthesiology

## 2013-03-04 ENCOUNTER — Ambulatory Visit (HOSPITAL_COMMUNITY)
Admission: RE | Admit: 2013-03-04 | Discharge: 2013-03-04 | Disposition: A | Discharge status: Home / Self Care | Payer: No Typology Code available for payment source | Source: Ambulatory Visit | Attending: Internal Medicine | Admitting: Internal Medicine

## 2013-03-04 VITALS — BP 142/98 | HR 97 | Wt 267.1 lb

## 2013-03-04 DIAGNOSIS — I5022 Chronic systolic (congestive) heart failure: Secondary | ICD-10-CM

## 2013-03-04 DIAGNOSIS — I10 Essential (primary) hypertension: Secondary | ICD-10-CM

## 2013-03-04 HISTORY — DX: Chronic systolic (congestive) heart failure: I50.22

## 2013-03-04 LAB — BASIC METABOLIC PANEL
BUN: 10 mg/dL (ref 6–23)
CO2: 28 mEq/L (ref 19–32)
Calcium: 9.4 mg/dL (ref 8.4–10.5)
Creatinine, Ser: 0.74 mg/dL (ref 0.50–1.35)
GFR calc non Af Amer: >90 mL/min (ref >90)
Glucose, Bld: 393 mg/dL — ABNORMAL HIGH (ref 70–99)
Sodium: 138 mEq/L (ref 135–145)

## 2013-03-04 MED ORDER — CARVEDILOL 12.5 MG PO TABS: ORAL_TABLET | ORAL | Status: DC

## 2013-03-04 MED ORDER — FUROSEMIDE 40 MG PO TABS: 40.0000 mg | ORAL_TABLET | Freq: Two times a day (BID) | ORAL | Status: DC

## 2013-03-04 NOTE — Patient Instructions (Addendum)
Take carvedilol 12.5 mg (1 tablet) in the morning and take 18.75 mg (1 1/2 tablets) in the evening.  Will refer to Pulmonology for a sleep study.  Follow up in 3 months.

## 2013-03-04 NOTE — Progress Notes (Signed)
Patient ID: Mitchell [name], male   DOB: May 26, 1971, 42 y.o.   MRN: [id number]  PCP: None: None [facility]: [facility]  Pulmonologist: None  HPI:  Mitchell [name] is a 42 year old male with multiple medical problems including obesity, uncontrolled hypertension, uncontrolled diabetes, obstructive sleep apnea, bipolar disease/schizophrenia, CVA, nonobstructive coronary disease and systolic congestive heart failure secondary to nonischemic cardiomyopathy.  09/23/11: 2D echo checked with reduction in LVEF to 15-20% with mild to moderate mitral valve regurgitation  ECHO 10/30/12 EF 20-25%   Follow up: Last visit increased carvedilol to 12.5 mg BID and switched from ACE-I to losartan 50 mg daily. He was supposed to have labs and a sleep study, however did not follow up. Doing well. Denies SOB/orthopnea/ or CP. Weight at home 260-265 lbs. Taking medications as prescribed and following low salt diet.   ROS: As per HPI otherwise negative  Past Medical History  Diagnosis Date  . Hypertension   . Hyperlipidemia   . Depression   . OSA (obstructive sleep apnea)     On CPAP; NON-COMPLIANT  . CVA (cerebral infarction)     No residual deficits  . Non-ischemic cardiomyopathy     No ischemia on myoview, showed EF of 42%. 2D echo showed EF 50-55% with diatolic dysfunction in 2009  . Post-cardiac injury syndrome     History of cardiac injury from blunt trauma  . PVCs (premature ventricular contractions)   . History of colonic polyps   . Obesity   . Syncope     Recurrent, thought to be vasovagal. Also has h/o frequent PVCs.   . Chronic systolic heart failure 11/2010    a. NICM 4/14 EF 20-25%, TR mild  . SOB (shortness of breath)   . Stroke   . 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 Myocardial infarction   . Headache(784.0)   . Diabetes mellitus     TYPE II; UNCONTROLLED BY HEMOGLOBIN A1c; STABLE AS  PER DISCHARGE    Current Outpatient Prescriptions  Medication Sig Dispense Refill  . carvedilol (COREG)  12.5 MG tablet Take 1 tablet (12.5 mg total) by mouth 2 (two) times daily with a meal.  60 tablet  3  . digoxin (LANOXIN) 0.25 MG tablet Take 1 tablet (0.25 mg total) by mouth daily.  30 tablet  6  . furosemide (LASIX) 40 MG tablet Take 40 mg by mouth 2 (two) times daily.      [name] Kitchen losartan (COZAAR) 50 MG tablet Take 1 tablet (50 mg total) by mouth daily.  30 tablet  3  . mupirocin ointment (BACTROBAN) 2 % Apply topically 3 (three) times daily.  22 g  0  . QUEtiapine (SEROQUEL) 100 MG tablet Take 100 mg by mouth at bedtime.      . sertraline (ZOLOFT) 100 MG tablet Take 100 mg by mouth 2 (two) times daily.       [name] Kitchen spironolactone (ALDACTONE) 25 MG tablet Take 1 tablet (25 mg total) by mouth daily.  30 tablet  6  . triamcinolone ointment (KENALOG) 0.5 % Apply topically 2 (two) times daily.  30 g  0   No current facility-administered medications for this encounter.    Allergies  Allergen Reactions  . Nsaids Hives    PHYSICAL EXAM: Filed Vitals:   03/04/13 0853  BP: 142/98  Pulse: 97  Weight: 267 lb 1.6 oz (121.156 kg)  SpO2: 97%  Last Weight: 269  General:  Well appearing. No respiratory difficulty HEENT: normal Neck: supple. JVP 6-7. Carotids  2+ bilat; no bruits. No lymphadenopathy or thryomegaly appreciated. Cor: PMI nondisplaced. Tachycardic & regular rhythm. No rubs, or murmurs. S3 Lungs: clear Abdomen: soft, nontender, distended. No hepatosplenomegaly. No bruits or masses. Good bowel sounds. Extremities: no cyanosis, clubbing, rash, trace edema Neuro: alert & oriented x 3, cranial nerves grossly intact. moves all 4 extremities w/o difficulty. Affect pleasant.    ASSESSMENT & PLAN:  1) Chronic systolic HF, NICM EF 20-25% - NYHA II. Volume status good. - Will increase carvedilol to 12.5/18.75 mg - Refer back to pulmonology for sleep study. - Discussion about risk for SCD and patient understands risk, however not interested in having an ICD - Will get BMET today. -Reinforced  the need and importance of daily weights, a low sodium diet, and fluid restriction (less than 2 L a day). Instructed to call the HF clinic if weight increases more than 3 lbs overnight or 5 lbs in a week.  - Follow up 3 months.  2) HTN - Will increase carvedilol as above; BP slightly elevated - continue BB. ARB and Cleda Daub.  Ulla Potash B NP-C 11:17 AM

## 2013-03-26 ENCOUNTER — Other Ambulatory Visit (HOSPITAL_COMMUNITY): Payer: No Typology Code available for payment source

## 2013-04-23 ENCOUNTER — Institutional Professional Consult (permissible substitution): Payer: No Typology Code available for payment source | Admitting: Pulmonary Disease

## 2013-05-15 ENCOUNTER — Emergency Department (HOSPITAL_BASED_OUTPATIENT_CLINIC_OR_DEPARTMENT_OTHER)
Admission: EM | Admit: 2013-05-15 | Discharge: 2013-05-15 | Disposition: A | Discharge status: Home / Self Care | Payer: No Typology Code available for payment source | Attending: Emergency Medicine | Admitting: Emergency Medicine

## 2013-05-15 ENCOUNTER — Encounter (HOSPITAL_BASED_OUTPATIENT_CLINIC_OR_DEPARTMENT_OTHER): Payer: Self-pay | Admitting: Emergency Medicine

## 2013-05-15 DIAGNOSIS — F329 Major depressive disorder, single episode, unspecified: Secondary | ICD-10-CM | POA: Insufficient documentation

## 2013-05-15 DIAGNOSIS — Z860100 Personal history of colon polyps, unspecified: Secondary | ICD-10-CM | POA: Insufficient documentation

## 2013-05-15 DIAGNOSIS — I252 Old myocardial infarction: Secondary | ICD-10-CM | POA: Insufficient documentation

## 2013-05-15 DIAGNOSIS — Z8673 Personal history of transient ischemic attack (TIA), and cerebral infarction without residual deficits: Secondary | ICD-10-CM | POA: Insufficient documentation

## 2013-05-15 DIAGNOSIS — K089 Disorder of teeth and supporting structures, unspecified: Secondary | ICD-10-CM | POA: Insufficient documentation

## 2013-05-15 DIAGNOSIS — F3289 Other specified depressive episodes: Secondary | ICD-10-CM | POA: Insufficient documentation

## 2013-05-15 DIAGNOSIS — Z79899 Other long term (current) drug therapy: Secondary | ICD-10-CM | POA: Insufficient documentation

## 2013-05-15 DIAGNOSIS — I10 Essential (primary) hypertension: Secondary | ICD-10-CM | POA: Insufficient documentation

## 2013-05-15 DIAGNOSIS — E119 Type 2 diabetes mellitus without complications: Secondary | ICD-10-CM | POA: Insufficient documentation

## 2013-05-15 DIAGNOSIS — E669 Obesity, unspecified: Secondary | ICD-10-CM | POA: Insufficient documentation

## 2013-05-15 DIAGNOSIS — Z9861 Coronary angioplasty status: Secondary | ICD-10-CM | POA: Insufficient documentation

## 2013-05-15 DIAGNOSIS — I5022 Chronic systolic (congestive) heart failure: Secondary | ICD-10-CM | POA: Insufficient documentation

## 2013-05-15 DIAGNOSIS — Z8619 Personal history of other infectious and parasitic diseases: Secondary | ICD-10-CM | POA: Insufficient documentation

## 2013-05-15 DIAGNOSIS — G4733 Obstructive sleep apnea (adult) (pediatric): Secondary | ICD-10-CM | POA: Insufficient documentation

## 2013-05-15 DIAGNOSIS — K0889 Other specified disorders of teeth and supporting structures: Secondary | ICD-10-CM

## 2013-05-15 MED ORDER — HYDROCODONE-ACETAMINOPHEN 5-325 MG PO TABS: 2.0000 | ORAL_TABLET | ORAL | Status: DC | PRN

## 2013-05-15 MED ORDER — AMOXICILLIN 500 MG PO CAPS: 500.0000 mg | ORAL_CAPSULE | Freq: Three times a day (TID) | ORAL | Status: DC

## 2013-05-15 NOTE — ED Notes (Signed)
Patient has a two week history of dental pain. States pain is progressively getting worse.  Multiple dental caries, broken and missing teeth.

## 2013-05-15 NOTE — ED Provider Notes (Signed)
CSN: [id number]     Arrival date & time 05/15/13  1018 History   First MD Initiated Contact with Patient 05/15/13 1027     Chief Complaint  Patient presents with  . Dental Pain   (Consider location/radiation/quality/duration/timing/severity/associated sxs/prior Treatment) HPI Comments: The patient presents today for evaluation of dental pain. Patient reports that he has multiple broken and decayed teeth. The last 2 weeks he has been having pain in the front part of his mouth, left upper. He reports a sharp tooth is very tender to the touch, but also has been rubbing on his gums and upper lip causing swelling and pain.  Patient is a 42 y.o. male presenting with tooth pain.  Dental Pain Associated symptoms: no fever     Past Medical History  Diagnosis Date  . Hypertension   . Hyperlipidemia   . Depression   . OSA (obstructive sleep apnea)     On CPAP; NON-COMPLIANT  . CVA (cerebral infarction)     No residual deficits  . Non-ischemic cardiomyopathy     No ischemia on myoview, showed EF of 42%. 2D echo showed EF 50-55% with diatolic dysfunction in 2009  . Post-cardiac injury syndrome     History of cardiac injury from blunt trauma  . PVCs (premature ventricular contractions)   . History of colonic polyps   . Obesity   . Syncope     Recurrent, thought to be vasovagal. Also has h/o frequent PVCs.   . Chronic systolic heart failure 11/2010    a. NICM 4/14 EF 20-25%, TR mild  . SOB (shortness of breath)   . Stroke   . 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 Myocardial infarction   . Headache(784.0)   . Diabetes mellitus     TYPE II; UNCONTROLLED BY HEMOGLOBIN A1c; STABLE AS  PER DISCHARGE   Past Surgical History  Procedure Laterality Date  . Cardiac catheterization  12/19/10    DIFFUSE NONOBSTRUCTIVE CAD; NONISCHEMIC CARDIOMYOPATHY; LEFT VENTRICULAR ANGIOGRAM WAS PERFORMED SECONDARY TO  ELEVATED LEFT VENTRICULAR FILLING PRESSURES  . Right thumb     Family History  Problem  Relation Age of Onset  . Heart disease Mother     MI  . Heart failure Mother   . Diabetes Mother     ALSO IN MOST OF HIS SIBLINGS; 2 UNLCES HAVE ALSO PASSED AWAY FROM DM  . Cardiomyopathy Mother    History  Substance Use Topics  . Smoking status: Never Smoker   . Smokeless tobacco: Not on file  . Alcohol Use: No    Review of Systems  Constitutional: Negative for fever.  HENT: Positive for dental problem.   All other systems reviewed and are negative.    Allergies  Nsaids  Home Medications   Current Outpatient Rx  Name  Route  Sig  Dispense  Refill  . metFORMIN (GLUCOPHAGE) 1000 MG tablet   Oral   Take 1,000 mg by mouth 2 (two) times daily with a meal.         . carvedilol (COREG) 12.5 MG tablet      Take 12.5 mg in the am and 18.75 mg (1 1/2 tablets) in the pm   75 tablet   3   . digoxin (LANOXIN) 0.25 MG tablet   Oral   Take 1 tablet (0.25 mg total) by mouth daily.   30 tablet   6   . furosemide (LASIX) 40 MG tablet   Oral   Take 1 tablet (40 mg  total) by mouth 2 (two) times daily.   60 tablet   3   . losartan (COZAAR) 50 MG tablet   Oral   Take 1 tablet (50 mg total) by mouth daily.   30 tablet   3   . mupirocin ointment (BACTROBAN) 2 %   Topical   Apply topically 3 (three) times daily.   22 g   0   . QUEtiapine (SEROQUEL) 100 MG tablet   Oral   Take 100 mg by mouth at bedtime.         . sertraline (ZOLOFT) 100 MG tablet   Oral   Take 100 mg by mouth 2 (two) times daily.          [name] Kitchen spironolactone (ALDACTONE) 25 MG tablet   Oral   Take 1 tablet (25 mg total) by mouth daily.   30 tablet   6   . triamcinolone ointment (KENALOG) 0.5 %   Topical   Apply topically 2 (two) times daily.   30 g   0    BP 174/98  Pulse 108  Temp(Src) 98.6 F (37 C) (Oral)  Resp 20  SpO2 97% Physical Exam  Constitutional: He is oriented to person, place, and time. He appears well-developed and well-nourished. No distress.  HENT:  Head:  Normocephalic and atraumatic.  Right Ear: Hearing normal.  Left Ear: Hearing normal.  Nose: Nose normal.  Mouth/Throat: Oropharynx is clear and moist and mucous membranes are normal.  Widespread dental DKA without any sign of dental abscess. The tendo decayed and fractured left upper central incisor with adjacent pain and no swelling  Eyes: Conjunctivae and EOM are normal. Pupils are equal, round, and reactive to light.  Neck: Normal range of motion. Neck supple.  Cardiovascular: Regular rhythm, S1 normal and S2 normal.  Exam reveals no gallop and no friction rub.   No murmur heard. Pulmonary/Chest: Effort normal and breath sounds normal. No respiratory distress. He exhibits no tenderness.  Abdominal: Soft. Normal appearance and bowel sounds are normal. There is no hepatosplenomegaly. There is no tenderness. There is no rebound, no guarding, no tenderness at McBurney's point and negative Murphy's sign. No hernia.  Musculoskeletal: Normal range of motion.  Neurological: He is alert and oriented to person, place, and time. He has normal strength. No cranial nerve deficit or sensory deficit. Coordination normal. GCS eye subscore is 4. GCS verbal subscore is 5. GCS motor subscore is 6.  Skin: Skin is warm, dry and intact. No rash noted. No cyanosis.  Psychiatric: He has a normal mood and affect. His speech is normal and behavior is normal. Thought content normal.    ED Course  Procedures (including critical care time) Labs Review Labs Reviewed - No data to display Imaging Review No results found.  EKG Interpretation   None       MDM  Diagnosis: Dental pain  Patient presents to the ER for evaluation of dental pain. Examination reveals widespread dental decay. No obvious evidence of dental abscess. Patient will require dental followup, including likely oral surgery followup because of many fractured teeth. Prescribed amoxicillin and Lortab for pain.   Gilda Crease,  MD 05/15/13 1046

## 2013-05-29 ENCOUNTER — Institutional Professional Consult (permissible substitution): Payer: No Typology Code available for payment source | Admitting: Pulmonary Disease

## 2013-06-30 ENCOUNTER — Encounter (HOSPITAL_BASED_OUTPATIENT_CLINIC_OR_DEPARTMENT_OTHER): Payer: Self-pay | Admitting: Emergency Medicine

## 2013-06-30 ENCOUNTER — Emergency Department (HOSPITAL_BASED_OUTPATIENT_CLINIC_OR_DEPARTMENT_OTHER)
Admission: EM | Admit: 2013-06-30 | Discharge: 2013-06-30 | Disposition: A | Discharge status: Home / Self Care | Payer: No Typology Code available for payment source | Attending: Emergency Medicine | Admitting: Emergency Medicine

## 2013-06-30 DIAGNOSIS — Z8619 Personal history of other infectious and parasitic diseases: Secondary | ICD-10-CM | POA: Insufficient documentation

## 2013-06-30 DIAGNOSIS — Z862 Personal history of diseases of the blood and blood-forming organs and certain disorders involving the immune mechanism: Secondary | ICD-10-CM | POA: Insufficient documentation

## 2013-06-30 DIAGNOSIS — I10 Essential (primary) hypertension: Secondary | ICD-10-CM | POA: Insufficient documentation

## 2013-06-30 DIAGNOSIS — I5022 Chronic systolic (congestive) heart failure: Secondary | ICD-10-CM | POA: Insufficient documentation

## 2013-06-30 DIAGNOSIS — Z79899 Other long term (current) drug therapy: Secondary | ICD-10-CM | POA: Insufficient documentation

## 2013-06-30 DIAGNOSIS — G4733 Obstructive sleep apnea (adult) (pediatric): Secondary | ICD-10-CM | POA: Insufficient documentation

## 2013-06-30 DIAGNOSIS — Z8673 Personal history of transient ischemic attack (TIA), and cerebral infarction without residual deficits: Secondary | ICD-10-CM | POA: Insufficient documentation

## 2013-06-30 DIAGNOSIS — Z95818 Presence of other cardiac implants and grafts: Secondary | ICD-10-CM | POA: Insufficient documentation

## 2013-06-30 DIAGNOSIS — F3289 Other specified depressive episodes: Secondary | ICD-10-CM | POA: Insufficient documentation

## 2013-06-30 DIAGNOSIS — E669 Obesity, unspecified: Secondary | ICD-10-CM | POA: Insufficient documentation

## 2013-06-30 DIAGNOSIS — I252 Old myocardial infarction: Secondary | ICD-10-CM | POA: Insufficient documentation

## 2013-06-30 DIAGNOSIS — Z860100 Personal history of colon polyps, unspecified: Secondary | ICD-10-CM | POA: Insufficient documentation

## 2013-06-30 DIAGNOSIS — L03019 Cellulitis of unspecified finger: Secondary | ICD-10-CM | POA: Insufficient documentation

## 2013-06-30 DIAGNOSIS — F329 Major depressive disorder, single episode, unspecified: Secondary | ICD-10-CM | POA: Insufficient documentation

## 2013-06-30 DIAGNOSIS — Z8639 Personal history of other endocrine, nutritional and metabolic disease: Secondary | ICD-10-CM | POA: Insufficient documentation

## 2013-06-30 DIAGNOSIS — L03012 Cellulitis of left finger: Secondary | ICD-10-CM

## 2013-06-30 DIAGNOSIS — R000 Tachycardia, unspecified: Secondary | ICD-10-CM | POA: Insufficient documentation

## 2013-06-30 DIAGNOSIS — E119 Type 2 diabetes mellitus without complications: Secondary | ICD-10-CM | POA: Insufficient documentation

## 2013-06-30 MED ORDER — LIDOCAINE HCL (PF) 1 % IJ SOLN
INTRAMUSCULAR | Status: AC
  Filled 2013-06-30: qty 5

## 2013-06-30 MED ORDER — BUPIVACAINE HCL 0.5 % IJ SOLN
INTRAMUSCULAR | Status: AC
  Filled 2013-06-30: qty 1

## 2013-06-30 MED ORDER — CEPHALEXIN 500 MG PO CAPS: 500.0000 mg | ORAL_CAPSULE | Freq: Four times a day (QID) | ORAL | Status: DC

## 2013-06-30 MED ORDER — BUPIVACAINE HCL 0.25 % IJ SOLN: 10.0000 mL | Freq: Once | INTRAMUSCULAR | Status: DC

## 2013-06-30 MED ORDER — SULFAMETHOXAZOLE-TMP DS 800-160 MG PO TABS
1.0000 | ORAL_TABLET | Freq: Once | ORAL | Status: DC
  Filled 2013-06-30: qty 1

## 2013-06-30 MED ORDER — CEPHALEXIN 250 MG PO CAPS
500.0000 mg | ORAL_CAPSULE | Freq: Once | ORAL | Status: AC
  Administered 2013-06-30: 500 mg via ORAL
  Filled 2013-06-30: qty 2

## 2013-06-30 MED ORDER — LIDOCAINE HCL (PF) 1 % IJ SOLN: 2.0000 mL | Freq: Once | INTRAMUSCULAR | Status: DC

## 2013-06-30 NOTE — ED Notes (Signed)
np at bedside

## 2013-06-30 NOTE — ED Provider Notes (Signed)
CSN: [id number]     Arrival date & time 06/30/13  1825 History   First MD Initiated Contact with Patient 06/30/13 1831     Chief Complaint  Patient presents with  . Hand Pain   (Consider location/radiation/quality/duration/timing/severity/associated sxs/prior Treatment) Patient is a 42 y.o. male presenting with hand pain. The history is provided by the patient.  Hand Pain This is a new problem.   THELMA VIANA is a 42 y.o. male who presents to the ED with pain and swelling to the left middle finger where he pulled a hang nail.  The area started 24 hours ago and has gotten worse.  Past Medical History  Diagnosis Date  . Hypertension   . Hyperlipidemia   . Depression   . OSA (obstructive sleep apnea)     On CPAP; NON-COMPLIANT  . CVA (cerebral infarction)     No residual deficits  . Non-ischemic cardiomyopathy     No ischemia on myoview, showed EF of 42%. 2D echo showed EF 50-55% with diatolic dysfunction in 2009  . Post-cardiac injury syndrome     History of cardiac injury from blunt trauma  . PVCs (premature ventricular contractions)   . History of colonic polyps   . Obesity   . Syncope     Recurrent, thought to be vasovagal. Also has h/o frequent PVCs.   . Chronic systolic heart failure 11/2010    a. NICM 4/14 EF 20-25%, TR mild  . SOB (shortness of breath)   . Stroke   . 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 Myocardial infarction   . Headache(784.0)   . Diabetes mellitus     TYPE II; UNCONTROLLED BY HEMOGLOBIN A1c; STABLE AS  PER DISCHARGE   Past Surgical History  Procedure Laterality Date  . Cardiac catheterization  12/19/10    DIFFUSE NONOBSTRUCTIVE CAD; NONISCHEMIC CARDIOMYOPATHY; LEFT VENTRICULAR ANGIOGRAM WAS PERFORMED SECONDARY TO  ELEVATED LEFT VENTRICULAR FILLING PRESSURES  . Right thumb     Family History  Problem Relation Age of Onset  . Heart disease Mother     MI  . Heart failure Mother   . Diabetes Mother     ALSO IN MOST OF HIS SIBLINGS; 2 UNLCES  HAVE ALSO PASSED AWAY FROM DM  . Cardiomyopathy Mother    History  Substance Use Topics  . Smoking status: Never Smoker   . Smokeless tobacco: Not on file  . Alcohol Use: No    Review of Systems Negative except as stated in HPI  Allergies  Nsaids  Home Medications   Current Outpatient Rx  Name  Route  Sig  Dispense  Refill  . carvedilol (COREG) 12.5 MG tablet      Take 12.5 mg in the am and 18.75 mg (1 1/2 tablets) in the pm   75 tablet   3   . digoxin (LANOXIN) 0.25 MG tablet   Oral   Take 1 tablet (0.25 mg total) by mouth daily.   30 tablet   6   . furosemide (LASIX) 40 MG tablet   Oral   Take 1 tablet (40 mg total) by mouth 2 (two) times daily.   60 tablet   3   . losartan (COZAAR) 50 MG tablet   Oral   Take 1 tablet (50 mg total) by mouth daily.   30 tablet   3   . metFORMIN (GLUCOPHAGE) 1000 MG tablet   Oral   Take 1,000 mg by mouth 2 (two) times daily with a  meal.         . mupirocin ointment (BACTROBAN) 2 %   Topical   Apply topically 3 (three) times daily.   22 g   0   . QUEtiapine (SEROQUEL) 100 MG tablet   Oral   Take 100 mg by mouth at bedtime.         . sertraline (ZOLOFT) 100 MG tablet   Oral   Take 100 mg by mouth 2 (two) times daily.          [name] Kitchen spironolactone (ALDACTONE) 25 MG tablet   Oral   Take 1 tablet (25 mg total) by mouth daily.   30 tablet   6   . triamcinolone ointment (KENALOG) 0.5 %   Topical   Apply topically 2 (two) times daily.   30 g   0    BP 169/110  Pulse 120  Temp(Src) 98.9 F (37.2 C) (Oral)  Resp 18  Ht 6' (1.829 m)  Wt 280 lb (127.007 kg)  BMI 37.97 kg/m2  SpO2 100% Physical Exam  Nursing note and vitals reviewed. Constitutional: He is oriented to person, place, and time. He appears well-developed and well-nourished. No distress.  Elevated BP   HENT:  Head: Normocephalic.  Eyes: EOM are normal.  Neck: Neck supple.  Cardiovascular: Tachycardia present.   Pulmonary/Chest: Effort  normal.  Abdominal: Soft. There is no tenderness.  Musculoskeletal: Normal range of motion.       Right hand: He exhibits tenderness and swelling. He exhibits normal range of motion. Normal sensation noted. Normal strength noted.       Hands: Tenderness and swelling of the left middle finger.   Neurological: He is alert and oriented to person, place, and time. No cranial nerve deficit.  Skin: Skin is warm and dry.  Psychiatric: He has a normal mood and affect. His behavior is normal.    ED Course  Procedures  INCISION AND DRAINAGE Performed by: NEESE,HOPE Consent: Verbal consent obtained. Risks and benefits: risks, benefits and alternatives were discussed Type: paronychia  Body area: left middle finger  Cleaned with betadine  Anesthesia: local infiltration  Local anesthetic: lidocaine 1% without epinephrine and Sensorcaine 0.25   Anesthetic total: 4 ml  Incision made with # 11 blade  Complexity: complex Blunt dissection to break up loculations  Drainage: purulent  Drainage amount: small  Patient tolerance: Patient tolerated the procedure well with no immediate complications.  42 y.o. male with paronychia of left middle finger. Will start antibiotics and have him return in 2 days for recheck. Will return sooner for any problems.   [address] [place], [place] 07/01/13 [phone]

## 2013-06-30 NOTE — ED Notes (Signed)
Pt reports (L) middle finger pain from a hangnail.  Noted to be slightly swollen

## 2013-07-01 NOTE — ED Provider Notes (Signed)
Medical screening examination/treatment/procedure(s) were performed by non-physician practitioner and as supervising physician I was immediately available for consultation/collaboration.  EKG Interpretation   None        Ethelda Chick, MD 07/01/13 [phone]

## 2013-07-09 ENCOUNTER — Ambulatory Visit (HOSPITAL_COMMUNITY): Payer: No Typology Code available for payment source | Attending: Internal Medicine

## 2013-07-21 ENCOUNTER — Other Ambulatory Visit: Payer: Self-pay

## 2013-07-21 ENCOUNTER — Encounter (HOSPITAL_BASED_OUTPATIENT_CLINIC_OR_DEPARTMENT_OTHER): Payer: Self-pay | Admitting: Emergency Medicine

## 2013-07-21 ENCOUNTER — Emergency Department (HOSPITAL_BASED_OUTPATIENT_CLINIC_OR_DEPARTMENT_OTHER): Payer: No Typology Code available for payment source

## 2013-07-21 ENCOUNTER — Emergency Department (HOSPITAL_BASED_OUTPATIENT_CLINIC_OR_DEPARTMENT_OTHER)
Admission: EM | Admit: 2013-07-21 | Discharge: 2013-07-21 | Disposition: A | Discharge status: Home / Self Care | Payer: No Typology Code available for payment source | Attending: Emergency Medicine | Admitting: Emergency Medicine

## 2013-07-21 DIAGNOSIS — G4700 Insomnia, unspecified: Secondary | ICD-10-CM | POA: Insufficient documentation

## 2013-07-21 DIAGNOSIS — J159 Unspecified bacterial pneumonia: Secondary | ICD-10-CM | POA: Insufficient documentation

## 2013-07-21 DIAGNOSIS — Z8619 Personal history of other infectious and parasitic diseases: Secondary | ICD-10-CM | POA: Insufficient documentation

## 2013-07-21 DIAGNOSIS — I10 Essential (primary) hypertension: Secondary | ICD-10-CM | POA: Insufficient documentation

## 2013-07-21 DIAGNOSIS — R000 Tachycardia, unspecified: Secondary | ICD-10-CM | POA: Insufficient documentation

## 2013-07-21 DIAGNOSIS — J189 Pneumonia, unspecified organism: Secondary | ICD-10-CM

## 2013-07-21 DIAGNOSIS — Z860100 Personal history of colon polyps, unspecified: Secondary | ICD-10-CM | POA: Insufficient documentation

## 2013-07-21 DIAGNOSIS — Z79899 Other long term (current) drug therapy: Secondary | ICD-10-CM | POA: Insufficient documentation

## 2013-07-21 DIAGNOSIS — F3289 Other specified depressive episodes: Secondary | ICD-10-CM | POA: Insufficient documentation

## 2013-07-21 DIAGNOSIS — F329 Major depressive disorder, single episode, unspecified: Secondary | ICD-10-CM | POA: Insufficient documentation

## 2013-07-21 DIAGNOSIS — G4733 Obstructive sleep apnea (adult) (pediatric): Secondary | ICD-10-CM | POA: Insufficient documentation

## 2013-07-21 DIAGNOSIS — I5022 Chronic systolic (congestive) heart failure: Secondary | ICD-10-CM | POA: Insufficient documentation

## 2013-07-21 DIAGNOSIS — E669 Obesity, unspecified: Secondary | ICD-10-CM | POA: Insufficient documentation

## 2013-07-21 DIAGNOSIS — Z8673 Personal history of transient ischemic attack (TIA), and cerebral infarction without residual deficits: Secondary | ICD-10-CM | POA: Insufficient documentation

## 2013-07-21 DIAGNOSIS — Z95818 Presence of other cardiac implants and grafts: Secondary | ICD-10-CM | POA: Insufficient documentation

## 2013-07-21 DIAGNOSIS — Z792 Long term (current) use of antibiotics: Secondary | ICD-10-CM | POA: Insufficient documentation

## 2013-07-21 DIAGNOSIS — E119 Type 2 diabetes mellitus without complications: Secondary | ICD-10-CM | POA: Insufficient documentation

## 2013-07-21 DIAGNOSIS — I252 Old myocardial infarction: Secondary | ICD-10-CM | POA: Insufficient documentation

## 2013-07-21 LAB — COMPREHENSIVE METABOLIC PANEL
ALT: 24 U/L (ref 0–53)
AST: 22 U/L (ref 0–37)
Albumin: 3.6 g/dL (ref 3.5–5.2)
Alkaline Phosphatase: 80 U/L (ref 39–117)
BUN: 10 mg/dL (ref 6–23)
Chloride: 99 mEq/L (ref 96–112)
Creatinine, Ser: 0.7 mg/dL (ref 0.50–1.35)
GFR calc Af Amer: >90 mL/min (ref >90)
Glucose, Bld: 339 mg/dL — ABNORMAL HIGH (ref 70–99)
Potassium: 3.6 mEq/L (ref 3.5–5.1)
Sodium: 138 mEq/L (ref 135–145)
Total Bilirubin: 0.6 mg/dL (ref 0.3–1.2)
Total Protein: 7.1 g/dL (ref 6.0–8.3)

## 2013-07-21 LAB — CBC WITH DIFFERENTIAL/PLATELET
Basophils Relative: 0 % (ref 0–1)
Eosinophils Absolute: 0.1 10*3/uL (ref 0.0–0.7)
Lymphocytes Relative: 5 % — ABNORMAL LOW (ref 12–46)
MCH: 29.7 pg (ref 26.0–34.0)
MCV: 87.1 fL (ref 78.0–100.0)
Monocytes Absolute: 1 10*3/uL (ref 0.1–1.0)
Monocytes Relative: 10 % (ref 3–12)
Neutro Abs: 8 10*3/uL — ABNORMAL HIGH (ref 1.7–7.7)
Neutrophils Relative %: 84 % — ABNORMAL HIGH (ref 43–77)
WBC: 9.5 10*3/uL (ref 4.0–10.5)

## 2013-07-21 LAB — PRO B NATRIURETIC PEPTIDE: Pro B Natriuretic peptide (BNP): 378.7 pg/mL — ABNORMAL HIGH (ref 0–125)

## 2013-07-21 MED ORDER — ONDANSETRON HCL 4 MG/2ML IJ SOLN
4.0000 mg | Freq: Once | INTRAMUSCULAR | Status: AC
  Administered 2013-07-21: 4 mg via INTRAVENOUS
  Filled 2013-07-21: qty 2

## 2013-07-21 MED ORDER — FUROSEMIDE 10 MG/ML IJ SOLN
40.0000 mg | Freq: Once | INTRAMUSCULAR | Status: AC
  Administered 2013-07-21: 40 mg via INTRAVENOUS
  Filled 2013-07-21: qty 4

## 2013-07-21 MED ORDER — ONDANSETRON HCL 4 MG PO TABS: 4.0000 mg | ORAL_TABLET | Freq: Four times a day (QID) | ORAL | Status: DC

## 2013-07-21 MED ORDER — DOXYCYCLINE HYCLATE 100 MG PO CAPS: 100.0000 mg | ORAL_CAPSULE | Freq: Two times a day (BID) | ORAL | Status: DC

## 2013-07-21 NOTE — ED Provider Notes (Signed)
CSN: [id number]     Arrival date & time 07/21/13  4098 History   First MD Initiated Contact with Patient 07/21/13 [phone]     Chief Complaint  Patient presents with  . Nausea  . Cough  . Insomnia  . Emesis   (Consider location/radiation/quality/duration/timing/severity/associated sxs/prior Treatment) Patient is a 42 y.o. male presenting with cough.  Cough Cough characteristics:  Non-productive Severity:  Moderate Onset quality:  Gradual Duration:  2 days Timing:  Constant Progression:  Unchanged Chronicity:  New Context: sick contacts   Relieved by:  Nothing Worsened by:  Lying down Associated symptoms: shortness of breath (not significantly worse than usual)   Associated symptoms: no chest pain, no fever and no sinus congestion     Past Medical History  Diagnosis Date  . Hypertension   . Hyperlipidemia   . Depression   . OSA (obstructive sleep apnea)     On CPAP; NON-COMPLIANT  . CVA (cerebral infarction)     No residual deficits  . Non-ischemic cardiomyopathy     No ischemia on myoview, showed EF of 42%. 2D echo showed EF 50-55% with diatolic dysfunction in 2009  . Post-cardiac injury syndrome     History of cardiac injury from blunt trauma  . PVCs (premature ventricular contractions)   . History of colonic polyps   . Obesity   . Syncope     Recurrent, thought to be vasovagal. Also has h/o frequent PVCs.   . Chronic systolic heart failure 11/2010    a. NICM 4/14 EF 20-25%, TR mild  . SOB (shortness of breath)   . Stroke   . 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 Myocardial infarction   . Headache(784.0)   . Diabetes mellitus     TYPE II; UNCONTROLLED BY HEMOGLOBIN A1c; STABLE AS  PER DISCHARGE   Past Surgical History  Procedure Laterality Date  . Cardiac catheterization  12/19/10    DIFFUSE NONOBSTRUCTIVE CAD; NONISCHEMIC CARDIOMYOPATHY; LEFT VENTRICULAR ANGIOGRAM WAS PERFORMED SECONDARY TO  ELEVATED LEFT VENTRICULAR FILLING PRESSURES  . Right thumb     Family  History  Problem Relation Age of Onset  . Heart disease Mother     MI  . Heart failure Mother   . Diabetes Mother     ALSO IN MOST OF HIS SIBLINGS; 2 UNLCES HAVE ALSO PASSED AWAY FROM DM  . Cardiomyopathy Mother    History  Substance Use Topics  . Smoking status: Never Smoker   . Smokeless tobacco: Not on file  . Alcohol Use: No    Review of Systems  Constitutional: Negative for fever.  HENT: Negative for congestion.   Respiratory: Positive for cough and shortness of breath (not significantly worse than usual).   Cardiovascular: Negative for chest pain.  Gastrointestinal: Negative for nausea, vomiting, abdominal pain and diarrhea.  All other systems reviewed and are negative.    Allergies  Nsaids  Home Medications   Current Outpatient Rx  Name  Route  Sig  Dispense  Refill  . carvedilol (COREG) 12.5 MG tablet      Take 12.5 mg in the am and 18.75 mg (1 1/2 tablets) in the pm   75 tablet   3   . cephALEXin (KEFLEX) 500 MG capsule   Oral   Take 1 capsule (500 mg total) by mouth 4 (four) times daily.   40 capsule   0   . digoxin (LANOXIN) 0.25 MG tablet   Oral   Take 1 tablet (0.25 mg total)  by mouth daily.   30 tablet   6   . furosemide (LASIX) 40 MG tablet   Oral   Take 1 tablet (40 mg total) by mouth 2 (two) times daily.   60 tablet   3   . losartan (COZAAR) 50 MG tablet   Oral   Take 1 tablet (50 mg total) by mouth daily.   30 tablet   3   . metFORMIN (GLUCOPHAGE) 1000 MG tablet   Oral   Take 1,000 mg by mouth 2 (two) times daily with a meal.         . mupirocin ointment (BACTROBAN) 2 %   Topical   Apply topically 3 (three) times daily.   22 g   0   . QUEtiapine (SEROQUEL) 100 MG tablet   Oral   Take 100 mg by mouth at bedtime.         . sertraline (ZOLOFT) 100 MG tablet   Oral   Take 100 mg by mouth 2 (two) times daily.          [name] Kitchen spironolactone (ALDACTONE) 25 MG tablet   Oral   Take 1 tablet (25 mg total) by mouth daily.    30 tablet   6   . triamcinolone ointment (KENALOG) 0.5 %   Topical   Apply topically 2 (two) times daily.   30 g   0    BP 162/106  Pulse 109  Temp(Src) 98.8 F (37.1 C) (Oral)  Resp 22  Ht 6' (1.829 m)  Wt 280 lb (127.007 kg)  BMI 37.97 kg/m2  SpO2 96% Physical Exam  Nursing note and vitals reviewed. Constitutional: He is oriented to person, place, and time. He appears well-developed and well-nourished. No distress.  HENT:  Head: Normocephalic and atraumatic.  Mouth/Throat: Oropharynx is clear and moist.  Eyes: Conjunctivae are normal. Pupils are equal, round, and reactive to light. No scleral icterus.  Neck: Neck supple.  Cardiovascular: Regular rhythm and intact distal pulses.  Tachycardia present.  Exam reveals gallop and S3.   No murmur heard. Pulmonary/Chest: Effort normal and breath sounds normal. No stridor. No respiratory distress. He has no wheezes. He has no rales.  Abdominal: Soft. He exhibits no distension. There is no tenderness.  Musculoskeletal: Normal range of motion. He exhibits no edema.  Neurological: He is alert and oriented to person, place, and time.  Skin: Skin is warm and dry. No rash noted.  Psychiatric: He has a normal mood and affect. His behavior is normal.    ED Course  Procedures (including critical care time) Labs Review Labs Reviewed  CBC WITH DIFFERENTIAL - Abnormal; Notable for the following:    Hemoglobin 12.7 (*)    HCT 37.2 (*)    Platelets 129 (*)    Neutrophils Relative % 84 (*)    Neutro Abs 8.0 (*)    Lymphocytes Relative 5 (*)    Lymphs Abs 0.5 (*)    All other components within normal limits  PRO B NATRIURETIC PEPTIDE - Abnormal; Notable for the following:    Pro B Natriuretic peptide (BNP) 378.7 (*)    All other components within normal limits  COMPREHENSIVE METABOLIC PANEL - Abnormal; Notable for the following:    Glucose, Bld 339 (*)    All other components within normal limits   Imaging Review Dg Chest 2  View  07/21/2013   CLINICAL DATA:  Cough and nausea  EXAM: CHEST  2 VIEW  COMPARISON:  August 17, 2012  FINDINGS: There is focal airspace consolidation axillary subsegment of the right upper lobe. Lungs are otherwise clear. Heart is mildly enlarged with normal pulmonary vascularity. No adenopathy. No bone lesions.  IMPRESSION: Focal airspace consolidation in the axillary subsegment right upper lobe. Lungs otherwise clear. Heart is prominent but stable.   Electronically Signed   By: Bretta Bang M.D.   On: 07/21/2013 08:52  All radiology studies independently viewed by me.     EKG Interpretation    Date/Time:  Monday July 21 2013 09:18:28 EST Ventricular Rate:  115 PR Interval:  162 QRS Duration: 102 QT Interval:  356 QTC Calculation: 492 R Axis:   -38 Text Interpretation:  Sinus tachycardia Right atrial enlargement Left axis deviation Left ventricular hypertrophy Abnormal ECG No significant change was found Confirmed by [facility]  MD, TREY ([id number]) on 07/21/2013 10:23:19 AM            MDM   1. Community acquired pneumonia    42 yo male with hx of of CHF presenting with cough, vomiting, and a few loose stools.  Coughing during exam, but otherwise well appearing.  Hx of CHF and has S3 on exam, but no signs of fluid overload.  He missed his dose of lasix this morning, so will provide here.  His CXR is consistent with pneumonia.  He is non toxic and not septic.  Plan to dc with doxycycline.  Doxy chosen due to drug interactions with other choices. Advised close pcp follow up.       Candyce Churn, MD 07/21/13 [phone]

## 2013-07-21 NOTE — ED Notes (Signed)
Pt reports onset of cough yesterday and developed nausea, vomiting, and insomnia last night.

## 2013-07-21 NOTE — ED Notes (Addendum)
Initial metabolic panel hemolyzed per lab, redraw performed and sent to lab.

## 2013-07-22 ENCOUNTER — Emergency Department (HOSPITAL_COMMUNITY): Payer: No Typology Code available for payment source

## 2013-07-22 ENCOUNTER — Inpatient Hospital Stay (HOSPITAL_COMMUNITY)
Admission: EM | Admit: 2013-07-22 | Discharge: 2013-07-25 | DRG: 194 | Disposition: A | Discharge status: Home / Self Care | Payer: No Typology Code available for payment source | Attending: Internal Medicine | Admitting: Internal Medicine

## 2013-07-22 ENCOUNTER — Encounter (HOSPITAL_COMMUNITY): Payer: Self-pay | Admitting: Emergency Medicine

## 2013-07-22 DIAGNOSIS — Z9119 Patient's noncompliance with other medical treatment and regimen: Secondary | ICD-10-CM

## 2013-07-22 DIAGNOSIS — J159 Unspecified bacterial pneumonia: Principal | ICD-10-CM | POA: Diagnosis present

## 2013-07-22 DIAGNOSIS — E876 Hypokalemia: Secondary | ICD-10-CM | POA: Diagnosis present

## 2013-07-22 DIAGNOSIS — E119 Type 2 diabetes mellitus without complications: Secondary | ICD-10-CM

## 2013-07-22 DIAGNOSIS — I428 Other cardiomyopathies: Secondary | ICD-10-CM | POA: Diagnosis present

## 2013-07-22 DIAGNOSIS — J449 Chronic obstructive pulmonary disease, unspecified: Secondary | ICD-10-CM

## 2013-07-22 DIAGNOSIS — E669 Obesity, unspecified: Secondary | ICD-10-CM | POA: Diagnosis present

## 2013-07-22 DIAGNOSIS — Z6835 Body mass index (BMI) 35.0-35.9, adult: Secondary | ICD-10-CM

## 2013-07-22 DIAGNOSIS — F3289 Other specified depressive episodes: Secondary | ICD-10-CM | POA: Diagnosis present

## 2013-07-22 DIAGNOSIS — Z23 Encounter for immunization: Secondary | ICD-10-CM

## 2013-07-22 DIAGNOSIS — J189 Pneumonia, unspecified organism: Secondary | ICD-10-CM | POA: Diagnosis present

## 2013-07-22 DIAGNOSIS — Z886 Allergy status to analgesic agent status: Secondary | ICD-10-CM

## 2013-07-22 DIAGNOSIS — Z79899 Other long term (current) drug therapy: Secondary | ICD-10-CM

## 2013-07-22 DIAGNOSIS — R9431 Abnormal electrocardiogram [ECG] [EKG]: Secondary | ICD-10-CM | POA: Diagnosis present

## 2013-07-22 DIAGNOSIS — I509 Heart failure, unspecified: Secondary | ICD-10-CM | POA: Diagnosis present

## 2013-07-22 DIAGNOSIS — F329 Major depressive disorder, single episode, unspecified: Secondary | ICD-10-CM | POA: Diagnosis present

## 2013-07-22 DIAGNOSIS — Z91199 Patient's noncompliance with other medical treatment and regimen due to unspecified reason: Secondary | ICD-10-CM

## 2013-07-22 DIAGNOSIS — I252 Old myocardial infarction: Secondary | ICD-10-CM

## 2013-07-22 DIAGNOSIS — Z833 Family history of diabetes mellitus: Secondary | ICD-10-CM

## 2013-07-22 DIAGNOSIS — E118 Type 2 diabetes mellitus with unspecified complications: Secondary | ICD-10-CM | POA: Diagnosis present

## 2013-07-22 DIAGNOSIS — R55 Syncope and collapse: Secondary | ICD-10-CM

## 2013-07-22 DIAGNOSIS — T502X5A Adverse effect of carbonic-anhydrase inhibitors, benzothiadiazides and other diuretics, initial encounter: Secondary | ICD-10-CM | POA: Diagnosis present

## 2013-07-22 DIAGNOSIS — I5022 Chronic systolic (congestive) heart failure: Secondary | ICD-10-CM | POA: Diagnosis present

## 2013-07-22 DIAGNOSIS — IMO0001 Reserved for inherently not codable concepts without codable children: Secondary | ICD-10-CM | POA: Diagnosis present

## 2013-07-22 DIAGNOSIS — I10 Essential (primary) hypertension: Secondary | ICD-10-CM | POA: Diagnosis present

## 2013-07-22 DIAGNOSIS — G4733 Obstructive sleep apnea (adult) (pediatric): Secondary | ICD-10-CM | POA: Diagnosis present

## 2013-07-22 DIAGNOSIS — Z8249 Family history of ischemic heart disease and other diseases of the circulatory system: Secondary | ICD-10-CM

## 2013-07-22 DIAGNOSIS — Z8673 Personal history of transient ischemic attack (TIA), and cerebral infarction without residual deficits: Secondary | ICD-10-CM

## 2013-07-22 DIAGNOSIS — E785 Hyperlipidemia, unspecified: Secondary | ICD-10-CM | POA: Diagnosis present

## 2013-07-22 LAB — PRO B NATRIURETIC PEPTIDE: Pro B Natriuretic peptide (BNP): 199.8 pg/mL — ABNORMAL HIGH (ref 0–125)

## 2013-07-22 LAB — CBC WITH DIFFERENTIAL/PLATELET
Basophils Absolute: 0 10*3/uL (ref 0.0–0.1)
Basophils Relative: 0 % (ref 0–1)
Eosinophils Absolute: 0 10*3/uL (ref 0.0–0.7)
Eosinophils Relative: 0 % (ref 0–5)
HCT: 38.5 % — ABNORMAL LOW (ref 39.0–52.0)
Hemoglobin: 13.2 g/dL (ref 13.0–17.0)
Lymphocytes Relative: 10 % — ABNORMAL LOW (ref 12–46)
MCHC: 34.3 g/dL (ref 30.0–36.0)
MCV: 89.1 fL (ref 78.0–100.0)
Monocytes Absolute: 1 10*3/uL (ref 0.1–1.0)
Platelets: 136 10*3/uL — ABNORMAL LOW (ref 150–400)
RDW: 12.5 % (ref 11.5–15.5)
WBC: 8.5 10*3/uL (ref 4.0–10.5)

## 2013-07-22 LAB — GLUCOSE, CAPILLARY

## 2013-07-22 LAB — BASIC METABOLIC PANEL
CO2: 28 mEq/L (ref 19–32)
Calcium: 8.2 mg/dL — ABNORMAL LOW (ref 8.4–10.5)
Creatinine, Ser: 1.02 mg/dL (ref 0.50–1.35)
GFR calc non Af Amer: 88 mL/min — ABNORMAL LOW (ref >90)
Glucose, Bld: 314 mg/dL — ABNORMAL HIGH (ref 70–99)
Sodium: 136 mEq/L (ref 135–145)

## 2013-07-22 MED ORDER — DEXTROSE 5 % IV SOLN
1.0000 g | INTRAVENOUS | Status: DC
  Administered 2013-07-23 – 2013-07-25 (×3): 1 g via INTRAVENOUS
  Filled 2013-07-22 (×3): qty 10

## 2013-07-22 MED ORDER — INSULIN GLARGINE 100 UNIT/ML ~~LOC~~ SOLN
10.0000 [IU] | Freq: Every day | SUBCUTANEOUS | Status: DC
  Administered 2013-07-22: 22:00:00 10 [IU] via SUBCUTANEOUS
  Filled 2013-07-22 (×3): qty 0.1

## 2013-07-22 MED ORDER — INSULIN ASPART 100 UNIT/ML ~~LOC~~ SOLN
0.0000 [IU] | Freq: Three times a day (TID) | SUBCUTANEOUS | Status: DC
  Administered 2013-07-23 (×2): 5 [IU] via SUBCUTANEOUS
  Administered 2013-07-23: 08:00:00 11 [IU] via SUBCUTANEOUS
  Administered 2013-07-24: 17:00:00 5 [IU] via SUBCUTANEOUS
  Administered 2013-07-24: 12:00:00 8 [IU] via SUBCUTANEOUS
  Administered 2013-07-24: 08:00:00 5 [IU] via SUBCUTANEOUS
  Administered 2013-07-25: 11 [IU] via SUBCUTANEOUS
  Administered 2013-07-25: 3 [IU] via SUBCUTANEOUS

## 2013-07-22 MED ORDER — ACETAMINOPHEN 650 MG RE SUPP: 650.0000 mg | Freq: Four times a day (QID) | RECTAL | Status: DC | PRN

## 2013-07-22 MED ORDER — OSELTAMIVIR PHOSPHATE 75 MG PO CAPS
75.0000 mg | ORAL_CAPSULE | Freq: Every day | ORAL | Status: DC
  Administered 2013-07-22 – 2013-07-23 (×2): 75 mg via ORAL
  Filled 2013-07-22 (×2): qty 1

## 2013-07-22 MED ORDER — LOSARTAN POTASSIUM 50 MG PO TABS
50.0000 mg | ORAL_TABLET | Freq: Every day | ORAL | Status: DC
  Administered 2013-07-22: 50 mg via ORAL
  Filled 2013-07-22 (×2): qty 1

## 2013-07-22 MED ORDER — FUROSEMIDE 40 MG PO TABS
40.0000 mg | ORAL_TABLET | Freq: Two times a day (BID) | ORAL | Status: DC
  Administered 2013-07-22 – 2013-07-25 (×6): 40 mg via ORAL
  Filled 2013-07-22 (×9): qty 1

## 2013-07-22 MED ORDER — PNEUMOCOCCAL VAC POLYVALENT 25 MCG/0.5ML IJ INJ
0.5000 mL | INJECTION | INTRAMUSCULAR | Status: AC
  Administered 2013-07-23: 0.5 mL via INTRAMUSCULAR
  Filled 2013-07-22: qty 0.5

## 2013-07-22 MED ORDER — ENOXAPARIN SODIUM 40 MG/0.4ML ~~LOC~~ SOLN
40.0000 mg | SUBCUTANEOUS | Status: DC
  Administered 2013-07-22 – 2013-07-24 (×3): 40 mg via SUBCUTANEOUS
  Filled 2013-07-22 (×4): qty 0.4

## 2013-07-22 MED ORDER — ONDANSETRON HCL 4 MG PO TABS
4.0000 mg | ORAL_TABLET | Freq: Four times a day (QID) | ORAL | Status: DC | PRN
  Administered 2013-07-23: 4 mg via ORAL
  Filled 2013-07-22: qty 1

## 2013-07-22 MED ORDER — GUAIFENESIN-DM 100-10 MG/5ML PO SYRP
5.0000 mL | ORAL_SOLUTION | ORAL | Status: DC | PRN
  Administered 2013-07-23 – 2013-07-25 (×4): 5 mL via ORAL
  Filled 2013-07-22 (×4): qty 5

## 2013-07-22 MED ORDER — SERTRALINE HCL 100 MG PO TABS
100.0000 mg | ORAL_TABLET | Freq: Two times a day (BID) | ORAL | Status: DC
  Administered 2013-07-22 – 2013-07-25 (×6): 100 mg via ORAL
  Filled 2013-07-22 (×7): qty 1

## 2013-07-22 MED ORDER — CARVEDILOL 12.5 MG PO TABS
12.5000 mg | ORAL_TABLET | Freq: Two times a day (BID) | ORAL | Status: DC
  Filled 2013-07-22: qty 1

## 2013-07-22 MED ORDER — ALBUTEROL SULFATE (5 MG/ML) 0.5% IN NEBU
2.5000 mg | INHALATION_SOLUTION | Freq: Four times a day (QID) | RESPIRATORY_TRACT | Status: DC
  Administered 2013-07-22: 2.5 mg via RESPIRATORY_TRACT
  Filled 2013-07-22: qty 0.5

## 2013-07-22 MED ORDER — QUETIAPINE FUMARATE 100 MG PO TABS
100.0000 mg | ORAL_TABLET | Freq: Every day | ORAL | Status: DC
  Administered 2013-07-22 – 2013-07-24 (×3): 100 mg via ORAL
  Filled 2013-07-22 (×4): qty 1

## 2013-07-22 MED ORDER — DEXTROSE 5 % IV SOLN
500.0000 mg | Freq: Once | INTRAVENOUS | Status: AC
  Administered 2013-07-22: 500 mg via INTRAVENOUS

## 2013-07-22 MED ORDER — ALBUTEROL SULFATE (5 MG/ML) 0.5% IN NEBU: 2.5000 mg | INHALATION_SOLUTION | RESPIRATORY_TRACT | Status: DC | PRN

## 2013-07-22 MED ORDER — CARVEDILOL 6.25 MG PO TABS
18.7500 mg | ORAL_TABLET | Freq: Two times a day (BID) | ORAL | Status: DC
  Administered 2013-07-23 – 2013-07-25 (×5): 18.75 mg via ORAL
  Filled 2013-07-22 (×7): qty 1

## 2013-07-22 MED ORDER — AZITHROMYCIN 500 MG PO TABS
500.0000 mg | ORAL_TABLET | ORAL | Status: DC
  Administered 2013-07-23 – 2013-07-25 (×3): 500 mg via ORAL
  Filled 2013-07-22 (×3): qty 1

## 2013-07-22 MED ORDER — INFLUENZA VAC SPLIT QUAD 0.5 ML IM SUSP
0.5000 mL | INTRAMUSCULAR | Status: AC
  Administered 2013-07-23: 10:00:00 0.5 mL via INTRAMUSCULAR
  Filled 2013-07-22: qty 0.5

## 2013-07-22 MED ORDER — SPIRONOLACTONE 25 MG PO TABS
25.0000 mg | ORAL_TABLET | Freq: Every day | ORAL | Status: DC
  Administered 2013-07-22 – 2013-07-24 (×3): 25 mg via ORAL
  Filled 2013-07-22 (×4): qty 1

## 2013-07-22 MED ORDER — DIGOXIN 250 MCG PO TABS
0.2500 mg | ORAL_TABLET | Freq: Every day | ORAL | Status: DC
  Administered 2013-07-22 – 2013-07-25 (×4): 0.25 mg via ORAL
  Filled 2013-07-22 (×4): qty 1

## 2013-07-22 MED ORDER — ACETAMINOPHEN 325 MG PO TABS: 650.0000 mg | ORAL_TABLET | Freq: Four times a day (QID) | ORAL | Status: DC | PRN

## 2013-07-22 MED ORDER — ALBUTEROL SULFATE (5 MG/ML) 0.5% IN NEBU
2.5000 mg | INHALATION_SOLUTION | Freq: Three times a day (TID) | RESPIRATORY_TRACT | Status: DC
  Administered 2013-07-23 – 2013-07-24 (×4): 2.5 mg via RESPIRATORY_TRACT
  Filled 2013-07-22 (×4): qty 0.5

## 2013-07-22 MED ORDER — HYDROCODONE-ACETAMINOPHEN 5-325 MG PO TABS
1.0000 | ORAL_TABLET | ORAL | Status: DC | PRN
  Administered 2013-07-23 – 2013-07-25 (×8): 1 via ORAL
  Administered 2013-07-25: 2 via ORAL
  Filled 2013-07-22: qty 1
  Filled 2013-07-22: qty 2
  Filled 2013-07-22 (×7): qty 1

## 2013-07-22 MED ORDER — ONDANSETRON HCL 4 MG/2ML IJ SOLN
4.0000 mg | Freq: Four times a day (QID) | INTRAMUSCULAR | Status: DC | PRN
  Administered 2013-07-22 – 2013-07-23 (×2): 4 mg via INTRAVENOUS
  Filled 2013-07-22 (×2): qty 2

## 2013-07-22 MED ORDER — DEXTROSE 5 % IV SOLN
1.0000 g | Freq: Once | INTRAVENOUS | Status: AC
  Administered 2013-07-22: 1 g via INTRAVENOUS
  Filled 2013-07-22: qty 10

## 2013-07-22 NOTE — H&P (Addendum)
[facility] History and Physical  Mitchell [name] ZOX:[id number] DOB: 1971-05-18 DOA: 07/22/2013  Referring physician: Dr Elwyn Lade PCP: Lamona Curl,   Chief Complaint:  Cough with chest congestion and shortness of breath since 4 days  HPI:  42 year old obese male with history of hypertension, hyperlipidemia, obstructive sleep apnea noncompliant with CPAP, history of CVA, nonischemic cardiomyopathy with EF of 25-30% as per last echo, NYHA class III CHF,  uncontrolled type 2 diabetes mellitus, history of MI, history of frequent PVCs who [facility] with productive cough with whitish phlegm of 3 days duration. Patient also reported some chest congestion, increased dyspnea on exertion and orthopnea for possible 1 day. he also reported generalized body aches and backaches. He denied any headache, blurred vision, dizziness, nausea, vomiting, chest pain, palpitations, leg swelling, fevers, chills, abdominal pain, bowel or urinary symptoms. Denies any joint pains. Denies any nasal congestion or ear aches. Reports that his daughters had URI symptoms. He denies any recent travel. 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 Patient was found to have possible right-sided infiltrate and given a course of doxycycline by ED yesterday as his QTC was prolonged and sent home. He returned back to the pediatric [place] today with worsening productive cough (with a small amount blood tinged sputum this morning) with orthopnea. A chest x-ray was repeated which showed right middle lobe pneumonia and [facility] call for admission. Patient was given a dose of IV Rocephin and azithromycin in the ED.   Review of Systems:  Constitutional: Denies fever, chills, diaphoresis, appetite change and fatigue.  HEENT: Congestion, Denies photophobia, eye pain, redness, hearing loss, ear pain, congestion, sore throat, rhinorrhea, sneezing, mouth  sores, trouble swallowing, neck pain, neck stiffness and tinnitus.   Respiratory:  SOB, DOE, productive cough, chest tightness,  denies wheezing.   Cardiovascular: Denies chest pain, palpitations and leg swelling.  Gastrointestinal: Denies nausea, vomiting, abdominal pain, diarrhea, constipation, blood in stool and abdominal distention.  Genitourinary: Denies dysuria, urgency, frequency, hematuria, flank pain and difficulty urinating.  Endocrine: Denies  polyuria, polydipsia. Musculoskeletal:  myalgias, back pain, denies joint swelling, arthralgias and gait problem.  Skin: Denies pallor, rash and wound.  Neurological: Denies dizziness, seizures, syncope, weakness, light-headedness, numbness and headaches.  Hematological: Denies adenopathy.  Psychiatric/Behavioral: Denies  confusion, nervousness, sleep disturbance and agitation   Past Medical History  Diagnosis Date  . Hypertension   . Hyperlipidemia   . Depression   . OSA (obstructive sleep apnea)     On CPAP; NON-COMPLIANT  . CVA (cerebral infarction)     No residual deficits  . Non-ischemic cardiomyopathy     No ischemia on myoview, showed EF of 42%. 2D echo showed EF 50-55% with diatolic dysfunction in 2009  . Post-cardiac injury syndrome     History of cardiac injury from blunt trauma  . PVCs (premature ventricular contractions)   . History of colonic polyps   . Obesity   . Syncope     Recurrent, thought to be vasovagal. Also has h/o frequent PVCs.   . Chronic systolic heart failure 11/2010    a. NICM 4/14 EF 20-25%, TR mild  . SOB (shortness of breath)   . Stroke   . 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 Myocardial infarction   . Headache(784.0)   . Diabetes mellitus     TYPE II; UNCONTROLLED BY HEMOGLOBIN A1c; STABLE AS  PER DISCHARGE  Past Surgical History  Procedure Laterality Date  . Cardiac catheterization  12/19/10    DIFFUSE NONOBSTRUCTIVE CAD; NONISCHEMIC CARDIOMYOPATHY; LEFT VENTRICULAR ANGIOGRAM WAS PERFORMED  SECONDARY TO  ELEVATED LEFT VENTRICULAR FILLING PRESSURES  . Right thumb     Social History:  reports that he has never smoked. He does not have any smokeless tobacco history on file. He reports that he does not drink alcohol or use illicit drugs.  Allergies  Allergen Reactions  . Nsaids Hives    Family History  Problem Relation Age of Onset  . Heart disease Mother     MI  . Heart failure Mother   . Diabetes Mother     ALSO IN MOST OF HIS SIBLINGS; 2 UNLCES HAVE ALSO PASSED AWAY FROM DM  . Cardiomyopathy Mother     Prior to Admission medications   Medication Sig Start Date End Date Taking? Authorizing Provider  carvedilol (COREG) 12.5 MG tablet Take 12.5-18.75 mg by mouth 2 (two) times daily with a meal. 1 tablet 1.5 tablet   Yes Historical Provider, MD  digoxin (LANOXIN) 0.25 MG tablet Take 1 tablet (0.25 mg total) by mouth daily. 10/30/12 12/05/13 Yes Amy D Clegg, NP  doxycycline (VIBRAMYCIN) 100 MG capsule Take 1 capsule (100 mg total) by mouth 2 (two) times daily. 07/21/13  Yes Candyce Churn, MD  furosemide (LASIX) 40 MG tablet Take 1 tablet (40 mg total) by mouth 2 (two) times daily. 03/04/13  Yes Aundria Rud, NP  losartan (COZAAR) 50 MG tablet Take 1 tablet (50 mg total) by mouth daily. 12/05/12  Yes Aundria Rud, NP  metFORMIN (GLUCOPHAGE) 1000 MG tablet Take 1,000 mg by mouth 2 (two) times daily with a meal.   Yes Historical Provider, MD  ondansetron (ZOFRAN) 4 MG tablet Take 1 tablet (4 mg total) by mouth every 6 (six) hours. 07/21/13  Yes Candyce Churn, MD  QUEtiapine (SEROQUEL) 100 MG tablet Take 100 mg by mouth at bedtime.   Yes Historical Provider, MD  sertraline (ZOLOFT) 100 MG tablet Take 100 mg by mouth 2 (two) times daily.    Yes Historical Provider, MD  spironolactone (ALDACTONE) 25 MG tablet Take 1 tablet (25 mg total) by mouth daily. 12/03/12 12/03/13 Yes Dolores Patty, MD    Physical Exam:  Filed Vitals:   07/22/13 1534 07/22/13 1549 07/22/13 1746  07/22/13 1845  BP:  125/70  128/79  Pulse:  106  101  Temp:  100 F (37.8 C) 100.4 F (38 C)   TempSrc:  Oral Rectal   Resp:  22    SpO2: 97% 100%  97%    Constitutional: Vital signs reviewed.  Patient is a middle aged obese male in no acute distress. Coughing intermittently HEENT: No pallor, no icterus, moist oral mucosa, no cervical lymphadenopathy  Cardiovascular: RRR, S1 normal, S2 normal, no MRG,  Pulmonary/Chest: Equal air entry bilaterally, scattered rhonchi Abdominal: Soft. Non-tender, non-distended, bowel sounds are normal, no masses, organomegaly, or guarding present.   extremities: Warm, no edema CNS: AAO x3   Labs on Admission:  Basic Metabolic Panel:  Recent Labs Lab 07/21/13 0928 07/22/13 1617  NA 138 136  K 3.6 3.3*  CL 99 95*  CO2 27 28  GLUCOSE 339* 314*  BUN 10 12  CREATININE 0.70 1.02  CALCIUM 8.9 8.2*   Liver Function Tests:  Recent Labs Lab 07/21/13 0928  AST 22  ALT 24  ALKPHOS 80  BILITOT 0.6  PROT 7.1  ALBUMIN 3.6  No results found for this basename: LIPASE, AMYLASE,  in the last 168 hours No results found for this basename: AMMONIA,  in the last 168 hours CBC:  Recent Labs Lab 07/21/13 0858 07/22/13 1617  WBC 9.5 8.5  NEUTROABS 8.0* 6.6  HGB 12.7* 13.2  HCT 37.2* 38.5*  MCV 87.1 89.1  PLT 129* 136*   Cardiac Enzymes: No results found for this basename: CKTOTAL, CKMB, CKMBINDEX, TROPONINI,  in the last 168 hours BNP: No components found with this basename: POCBNP,  CBG: No results found for this basename: GLUCAP,  in the last 168 hours  Radiological Exams on Admission: Dg Chest 2 View  07/22/2013   CLINICAL DATA:  Chest pain, shortness of breath, cough  EXAM: CHEST  2 VIEW  COMPARISON:  None.  FINDINGS: Consolidation involving the right lower lung, likely within the right middle lobe when correlating with the lateral view. No pleural effusion or pneumothorax.  Cardiomegaly.  Visualized osseous structures are within  normal limits.  IMPRESSION: Suspected right middle lobe consolidation/pneumonia.   Electronically Signed   By: Charline Bills M.D.   On: 07/22/2013 17:47   Dg Chest 2 View  07/21/2013   CLINICAL DATA:  Cough and nausea  EXAM: CHEST  2 VIEW  COMPARISON:  August 17, 2012  FINDINGS: There is focal airspace consolidation axillary subsegment of the right upper lobe. Lungs are otherwise clear. Heart is mildly enlarged with normal pulmonary vascularity. No adenopathy. No bone lesions.  IMPRESSION: Focal airspace consolidation in the axillary subsegment right upper lobe. Lungs otherwise clear. Heart is prominent but stable.   Electronically Signed   By: Bretta Bang M.D.   On: 07/21/2013 08:52    EKG: Sinus tachycardia at 101, no ST -T changes  Assessment/Plan  Principal Problem:   CAP (community acquired pneumonia) Admit to telemetry Patient given IV Rocephin and azithromycin in the ED which I would continue. We'll check for PCR given associated symptoms of fever, chest congestion and body aches. Also patient has not had a flu vaccine this season.[place] place him on empiric Tamiflu and send flu PCR. Monitor O2 sats. Follow blood culture, urine for Legionella and strep antigen,, sent sputum culture. -Will place him on scheduled meds. O2 via nasal cannula.   Active Problems:   DIABETES, TYPE 2 Elevated blood glucose. Check hemoglobin A1c. Place  on sliding scale insulin. Hold metformin. Ordered 10 units Lantus at bedtime     HYPERTENSION Resume home medications    Chronic systolic heart failure Continue home medications including carvedilol, lisinopril, spironolactone and Lasix. Continue digoxin   Hypokalemia Replenished  Obstructive sleep apnea Noncompliant to CPAP  Prolonged Qtc  qtc of 503. Monitor on tele while pt on azithro and trazodone.    DVT prophylaxis: Subcutaneous Lovenox Diet: Diabetic  Code Status: Full code Family Communication: None at  bedside Disposition Plan: Home once improved  Eddie North [facility] Pager [phone]  If 7PM-7AM, please contact night-coverage [email or web address] Password [facility] 07/22/2013, 7:09 PM  Total time spent on admission: 70 minutes

## 2013-07-22 NOTE — ED Notes (Signed)
Per EMS pt was seen at [facility] and dx with pneumonia yesterday. Pt is here today due to feeling really weak, and nausea/vomiting. Pt states he is not feeling any better so he is here today. Per EMS pt states he has not taken any medications. Pt received 8mg  Zofran, NS. 18g LH.

## 2013-07-22 NOTE — Progress Notes (Signed)
Report taken from [place], [place]. Awaiting patients arrival.

## 2013-07-22 NOTE — ED Provider Notes (Signed)
CSN: [id number]     Arrival date & time 07/22/13  1525 History   First MD Initiated Contact with Patient 07/22/13 1532     Chief Complaint  Patient presents with  . Pneumonia   (Consider location/radiation/quality/duration/timing/severity/associated sxs/prior Treatment) Patient is a 42 y.o. male presenting with cough.  Cough Cough characteristics:  Productive Sputum characteristics:  Bloody Severity:  Moderate Onset quality:  Gradual Duration:  3 days Timing:  Constant Progression:  Worsening Chronicity:  New Context: sick contacts   Relieved by:  Nothing Associated symptoms: shortness of breath (worsening, especially with exertion)   Associated symptoms: no chest pain and no fever   Associated symptoms comment:  Generalized fatigue and weakness   Past Medical History  Diagnosis Date  . Hypertension   . Hyperlipidemia   . Depression   . OSA (obstructive sleep apnea)     On CPAP; NON-COMPLIANT  . CVA (cerebral infarction)     No residual deficits  . Non-ischemic cardiomyopathy     No ischemia on myoview, showed EF of 42%. 2D echo showed EF 50-55% with diatolic dysfunction in 2009  . Post-cardiac injury syndrome     History of cardiac injury from blunt trauma  . PVCs (premature ventricular contractions)   . History of colonic polyps   . Obesity   . Syncope     Recurrent, thought to be vasovagal. Also has h/o frequent PVCs.   . Chronic systolic heart failure 11/2010    a. NICM 4/14 EF 20-25%, TR mild  . SOB (shortness of breath)   . Stroke   . 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 Myocardial infarction   . Headache(784.0)   . Diabetes mellitus     TYPE II; UNCONTROLLED BY HEMOGLOBIN A1c; STABLE AS  PER DISCHARGE   Past Surgical History  Procedure Laterality Date  . Cardiac catheterization  12/19/10    DIFFUSE NONOBSTRUCTIVE CAD; NONISCHEMIC CARDIOMYOPATHY; LEFT VENTRICULAR ANGIOGRAM WAS PERFORMED SECONDARY TO  ELEVATED LEFT VENTRICULAR FILLING PRESSURES  . Right  thumb     Family History  Problem Relation Age of Onset  . Heart disease Mother     MI  . Heart failure Mother   . Diabetes Mother     ALSO IN MOST OF HIS SIBLINGS; 2 UNLCES HAVE ALSO PASSED AWAY FROM DM  . Cardiomyopathy Mother    History  Substance Use Topics  . Smoking status: Never Smoker   . Smokeless tobacco: Not on file  . Alcohol Use: No    Review of Systems  Constitutional: Negative for fever.  HENT: Negative for congestion.   Respiratory: Positive for cough and shortness of breath (worsening, especially with exertion).   Cardiovascular: Negative for chest pain.  Gastrointestinal: Negative for nausea, vomiting, abdominal pain and diarrhea.  All other systems reviewed and are negative.    Allergies  Nsaids  Home Medications   Current Outpatient Rx  Name  Route  Sig  Dispense  Refill  . carvedilol (COREG) 12.5 MG tablet   Oral   Take 12.5-18.75 mg by mouth 2 (two) times daily with a meal. 1 tablet 1.5 tablet         . digoxin (LANOXIN) 0.25 MG tablet   Oral   Take 1 tablet (0.25 mg total) by mouth daily.   30 tablet   6   . doxycycline (VIBRAMYCIN) 100 MG capsule   Oral   Take 1 capsule (100 mg total) by mouth 2 (two) times daily.   20 capsule  0   . furosemide (LASIX) 40 MG tablet   Oral   Take 1 tablet (40 mg total) by mouth 2 (two) times daily.   60 tablet   3   . losartan (COZAAR) 50 MG tablet   Oral   Take 1 tablet (50 mg total) by mouth daily.   30 tablet   3   . metFORMIN (GLUCOPHAGE) 1000 MG tablet   Oral   Take 1,000 mg by mouth 2 (two) times daily with a meal.         . ondansetron (ZOFRAN) 4 MG tablet   Oral   Take 1 tablet (4 mg total) by mouth every 6 (six) hours.   12 tablet   0   . QUEtiapine (SEROQUEL) 100 MG tablet   Oral   Take 100 mg by mouth at bedtime.         . sertraline (ZOLOFT) 100 MG tablet   Oral   Take 100 mg by mouth 2 (two) times daily.          [name] Kitchen spironolactone (ALDACTONE) 25 MG tablet    Oral   Take 1 tablet (25 mg total) by mouth daily.   30 tablet   6    BP 125/70  Pulse 106  Temp(Src) 100 F (37.8 C) (Oral)  Resp 22  SpO2 100% Physical Exam  Nursing note and vitals reviewed. Constitutional: He is oriented to person, place, and time. He appears well-developed and well-nourished. No distress.  HENT:  Head: Normocephalic and atraumatic.  Mouth/Throat: Oropharynx is clear and moist.  Eyes: Conjunctivae are normal. Pupils are equal, round, and reactive to light. No scleral icterus.  Neck: Neck supple.  Cardiovascular: Normal rate, regular rhythm and intact distal pulses.  Exam reveals gallop and S3 (not as prominent today).   No murmur heard. Pulmonary/Chest: Effort normal. No stridor. No respiratory distress. He has no wheezes. He has rales (bibasilar, mild).  Abdominal: Soft. He exhibits no distension. There is no tenderness.  Musculoskeletal: Normal range of motion. He exhibits no edema.  Neurological: He is alert and oriented to person, place, and time.  Skin: Skin is warm and dry. No rash noted.  Psychiatric: He has a normal mood and affect. His behavior is normal.    ED Course  Procedures (including critical care time) Labs Review Labs Reviewed  CBC WITH DIFFERENTIAL - Abnormal; Notable for the following:    HCT 38.5 (*)    Platelets 136 (*)    Neutrophils Relative % 78 (*)    Lymphocytes Relative 10 (*)    All other components within normal limits  BASIC METABOLIC PANEL - Abnormal; Notable for the following:    Potassium 3.3 (*)    Chloride 95 (*)    Glucose, Bld 314 (*)    Calcium 8.2 (*)    GFR calc non Af Amer 88 (*)    All other components within normal limits  PRO B NATRIURETIC PEPTIDE - Abnormal; Notable for the following:    Pro B Natriuretic peptide (BNP) 199.8 (*)    All other components within normal limits  CULTURE, BLOOD (ROUTINE X 2)  CULTURE, BLOOD (ROUTINE X 2)   Imaging Review Dg Chest 2 View  07/21/2013   CLINICAL DATA:   Cough and nausea  EXAM: CHEST  2 VIEW  COMPARISON:  August 17, 2012  FINDINGS: There is focal airspace consolidation axillary subsegment of the right upper lobe. Lungs are otherwise clear. Heart is mildly enlarged with normal pulmonary  vascularity. No adenopathy. No bone lesions.  IMPRESSION: Focal airspace consolidation in the axillary subsegment right upper lobe. Lungs otherwise clear. Heart is prominent but stable.   Electronically Signed   By: Bretta Bang M.D.   On: 07/21/2013 08:52  All radiology studies independently viewed by me.     EKG Interpretation    Date/Time:  Tuesday July 22 2013 16:16:14 EST Ventricular Rate:  101 PR Interval:  160 QRS Duration: 108 QT Interval:  388 QTC Calculation: 503 R Axis:   -34 Text Interpretation:  Sinus tachycardia Ventricular premature complex Right atrial enlargement Left axis deviation Abnormal R-wave progression, late transition Prolonged QT interval other than PVC, no significant change compared to prior.  Confirmed by [facility]  MD, TREY ([id number]) on 07/22/2013 4:26:01 PM            MDM   1. CAP (community acquired pneumonia)    42 yo male diagnosed with pneumonia by me yesterday at [facility].  Since then, he reports deteriorating despite taking his antibiotics as prescribed.  He feels very weak and has been coughing up more sputum, which has had some blood tinge.  His workup is similar to yesterday's.  Plan admission for IV antibiotics as he appears to be worsening despite outpatient treatment.     Candyce Churn, MD 07/22/13 [phone]

## 2013-07-22 NOTE — Progress Notes (Signed)
Pt admitted to the unit. Pt is alert and oriented. Pt oriented to room, staff, and call bell. Bed in lowest position. Full assessment to Epic. Call bell with in reach. Told to call for assists. Will continue to monitor.  Brayah Urquilla E  

## 2013-07-22 NOTE — ED Notes (Signed)
Pt also states he has blood in his sputum when he coughs.

## 2013-07-23 DIAGNOSIS — E669 Obesity, unspecified: Secondary | ICD-10-CM

## 2013-07-23 DIAGNOSIS — I10 Essential (primary) hypertension: Secondary | ICD-10-CM

## 2013-07-23 DIAGNOSIS — I5022 Chronic systolic (congestive) heart failure: Secondary | ICD-10-CM

## 2013-07-23 DIAGNOSIS — E119 Type 2 diabetes mellitus without complications: Secondary | ICD-10-CM

## 2013-07-23 DIAGNOSIS — J189 Pneumonia, unspecified organism: Secondary | ICD-10-CM

## 2013-07-23 DIAGNOSIS — G4733 Obstructive sleep apnea (adult) (pediatric): Secondary | ICD-10-CM

## 2013-07-23 LAB — INFLUENZA PANEL BY PCR (TYPE A & B)
H1N1 flu by pcr: NOT DETECTED
Influenza A By PCR: NEGATIVE
Influenza B By PCR: NEGATIVE

## 2013-07-23 LAB — GLUCOSE, CAPILLARY
Glucose-Capillary: 234 mg/dL — ABNORMAL HIGH (ref 70–99)
Glucose-Capillary: 232 mg/dL — ABNORMAL HIGH (ref 70–99)
Glucose-Capillary: 276 mg/dL — ABNORMAL HIGH (ref 70–99)

## 2013-07-23 LAB — LEGIONELLA ANTIGEN, URINE: Legionella Antigen, Urine: NEGATIVE

## 2013-07-23 LAB — HEMOGLOBIN A1C: Hgb A1c MFr Bld: 16.3 % — ABNORMAL HIGH (ref <5.7)

## 2013-07-23 LAB — STREP PNEUMONIAE URINARY ANTIGEN: Strep Pneumo Urinary Antigen: NEGATIVE

## 2013-07-23 MED ORDER — POTASSIUM CHLORIDE CRYS ER 20 MEQ PO TBCR
40.0000 meq | EXTENDED_RELEASE_TABLET | Freq: Four times a day (QID) | ORAL | Status: AC
  Administered 2013-07-23 (×2): 40 meq via ORAL
  Filled 2013-07-23 (×2): qty 2

## 2013-07-23 MED ORDER — FUROSEMIDE 10 MG/ML IJ SOLN
40.0000 mg | Freq: Once | INTRAMUSCULAR | Status: AC
  Administered 2013-07-23: 16:00:00 40 mg via INTRAVENOUS
  Filled 2013-07-23: qty 4

## 2013-07-23 MED ORDER — INSULIN PEN STARTER KIT
1.0000 | Freq: Once | Status: AC
  Administered 2013-07-23: 1
  Filled 2013-07-23: qty 1

## 2013-07-23 MED ORDER — LIVING WELL WITH DIABETES BOOK
Freq: Once | Status: AC
  Administered 2013-07-23: 11:00:00
  Filled 2013-07-23: qty 1

## 2013-07-23 MED ORDER — INSULIN GLARGINE 100 UNIT/ML ~~LOC~~ SOLN
20.0000 [IU] | Freq: Every day | SUBCUTANEOUS | Status: DC
  Administered 2013-07-23: 20 [IU] via SUBCUTANEOUS
  Filled 2013-07-23 (×2): qty 0.2

## 2013-07-23 NOTE — Progress Notes (Signed)
Inpatient Diabetes Program Recommendations  AACE/ADA: New Consensus Statement on Inpatient Glycemic Control (2013)  Target Ranges:  Prepandial:   less than 140 mg/dL      Peak postprandial:   less than 180 mg/dL (1-2 hours)      Critically ill patients:  140 - 180 mg/dL  Results for AZAVIER, CRESON (MRN [id number]) as of 07/23/2013 13:45  Ref. Range 07/22/2013 21:53 07/23/2013 07:41 07/23/2013 11:48  Glucose-Capillary Latest Range: 70-99 mg/dL 409 (H) 811 (H) 914 (H)    Inpatient Diabetes Program Recommendations Insulin - Basal: Please consider increasing Lantus to 20 units QHS. Correction (SSI): Please consider increasing Novolog correction to Resistant scale and add bedtime correction scale. HgbA1C: A1C is in process.  Note: Patient has a history of diabetes (diagnosed about 15 years ago) and takes Metformin 1000 mg BID as an outpatient for diabetes management.  Patient reports that his PCP helps him manage his diabetes.  Currently, patient is ordered to receive Lantus 10 units QHS and Novolog 0-15 units AC for inpatient glycemic control.  Initial lab glucose was 314 mg/dl on 78/29 @ 56:21.  Blood glucose has ranged from 234-377 mg/dl over the past 16 hours and fasting glucose this morning was noted to be 323 mg/dl.  Please consider increasing Lantus to 20 units QHS, increasing Novolog correction to resistant scale, and adding Novolog bedtime correction scale.   Spoke with patient about diabetes and outpatient regimen for diabetes control.  Discussed old A1C results (12.1% on 09/24/2011.[name] KitchenMarland KitchenMarland Kitchennew A1C is in process) and explained what an A1C is, basic pathophysiology of DM Type 2, basic home care, importance of checking CBGs and maintaining good CBG control to prevent long-term and short-term complications.  Patient reports that he checks his blood sugar at home 2-3 times a week and the reading is always in the 300's mg/dl range.  Asked patient if he had ever been on insulin.  He states that he was  using Novolog insulin pens at one time.  He reported that he "stopped taking the Novolog because it was making him nauseated".  However, in further discussing insulin, patient stated that he "stopped taking insulin because it was too expensive and he was having issues affording it".  Asked patient if he was having any nausea or other side effects related to the insulins we are currently giving him and he states "No" he is not having any side effects to the current insulins being given to him as an inpatient.  Inquired about insurance and patient states that his wife works for [employer] and they have good [employer] now.  Explained to patient he is currently on Lantus and Novolog and asked that he or his wife call his insurance company and find out what the copays would be for both the Lantus and Novolog so he will know if he can afford those particular insulins.  Asked that patient let nursing staff and MD know if he felt he would not be able to afford the copays for the insulins so that more affordable insulins could be used (such as Novolin R, Novolin N, or Novolin 70/30 which can be purchased at [facility] for $25 per vial).  RNs to provide ongoing basic DM education at bedside with this patient and engage patient to actively check blood glucose and administer insulin injections.  Have ordered educational booklet, insulin pen starter kit, and DM videos.  Patient verbalized understanding of information discussed and reports that he does not have any further questions related to  diabetes at this time.  Will continue to follow.  Thanks, Orlando Penner, RN, MSN, CCRN Diabetes Coordinator Inpatient Diabetes Program [phone] (Team Pager) [phone] (AP office) [phone] [facility] office)

## 2013-07-23 NOTE — Progress Notes (Signed)
[facility] PROGRESS NOTE  Mitchell LEAMER ZOX:[id number] DOB: 1971-05-01 DOA: 07/22/2013 PCP: Arvilla Meres, MD  HPI/Subjective: Still has a lot of cough and sputum production. Chest pain associated with cough  Assessment/Plan: Principal Problem:   CAP (community acquired pneumonia) Active Problems:   DIABETES, TYPE 2   OBSTRUCTIVE SLEEP APNEA   HYPERTENSION   Chronic systolic heart failure   Community acquired bacterial pneumonia   Community acquired pneumonia -Patient started on Rocephin and azithromycin, flu PCR is negative. -Chest x-ray showed RML pneumonia. -Supportive management with bronchodilators, mucolytics, and the doses and oxygen as needed.  Diabetes mellitus type 2, uncontrolled -Hemoglobin A1c is 12.1 which is consistent with a mean blood glucose of 301. -Patient placed on insulin sliding scale, metformin held. -Started on 10 units of Lantus at bedtime, I will increase to 20 units.  Hypertension -Continue home medications.  Chronic systolic CHF -Continue home medications including carvedilol, digoxin and spironolactone. -Patient is on oral Lasix wall and as needed doses of IV Lasix.  Code Status: Full code Family Communication: Plan discussed with the patient. Disposition Plan: Remains inpatient   Consultants:  None  Procedures:  None  Antibiotics:  None   Objective: Filed Vitals:   07/23/13 1424  BP: 115/75  Pulse: 90  Temp: 98.5 F (36.9 C)  Resp: 20    Intake/Output Summary (Last 24 hours) at 07/23/13 1444 Last data filed at 07/23/13 0900  Gross per 24 hour  Intake    710 ml  Output      0 ml  Net    710 ml   Filed Weights   07/22/13 1939  Weight: 119.387 kg (263 lb 3.2 oz)    Exam: General: Alert and awake, oriented x3, not in any acute distress. HEENT: anicteric sclera, pupils reactive to light and accommodation, EOMI CVS: S1-S2 clear, no murmur rubs or gallops Chest: clear to auscultation bilaterally, no  wheezing, rales or rhonchi Abdomen: soft nontender, nondistended, normal bowel sounds, no organomegaly Extremities: no cyanosis, clubbing or edema noted bilaterally Neuro: Cranial nerves II-XII intact, no focal neurological deficits  Data Reviewed: Basic Metabolic Panel:  Recent Labs Lab 07/21/13 0928 07/22/13 1617  NA 138 136  K 3.6 3.3*  CL 99 95*  CO2 27 28  GLUCOSE 339* 314*  BUN 10 12  CREATININE 0.70 1.02  CALCIUM 8.9 8.2*   Liver Function Tests:  Recent Labs Lab 07/21/13 0928  AST 22  ALT 24  ALKPHOS 80  BILITOT 0.6  PROT 7.1  ALBUMIN 3.6   No results found for this basename: LIPASE, AMYLASE,  in the last 168 hours No results found for this basename: AMMONIA,  in the last 168 hours CBC:  Recent Labs Lab 07/21/13 0858 07/22/13 1617  WBC 9.5 8.5  NEUTROABS 8.0* 6.6  HGB 12.7* 13.2  HCT 37.2* 38.5*  MCV 87.1 89.1  PLT 129* 136*   Cardiac Enzymes: No results found for this basename: CKTOTAL, CKMB, CKMBINDEX, TROPONINI,  in the last 168 hours BNP (last 3 results)  Recent Labs  08/17/12 0110 07/21/13 0928 07/22/13 1617  PROBNP 725.0* 378.7* 199.8*   CBG:  Recent Labs Lab 07/22/13 2153 07/23/13 0741 07/23/13 1148  GLUCAP 377* 323* 234*    Micro Recent Results (from the past 240 hour(s))  CULTURE, BLOOD (ROUTINE X 2)     Status: None   Collection Time    07/22/13  4:20 PM      Result Value Range Status   Specimen Description BLOOD  ARM RIGHT   Final   Special Requests BOTTLES DRAWN AEROBIC ONLY 10CC   Final   Culture  Setup Time     Final   Value: 07/22/2013 22:51     Performed at [facility]   Culture     Final   Value:        BLOOD CULTURE RECEIVED NO GROWTH TO DATE CULTURE WILL BE HELD FOR 5 DAYS BEFORE ISSUING A FINAL NEGATIVE REPORT     Performed at [facility]   Report Status PENDING   Incomplete  CULTURE, BLOOD (ROUTINE X 2)     Status: None   Collection Time    07/22/13  4:25 PM      Result Value Range  Status   Specimen Description BLOOD HAND RIGHT   Final   Special Requests BOTTLES DRAWN AEROBIC ONLY 10CC   Final   Culture  Setup Time     Final   Value: 07/22/2013 22:51     Performed at [facility]   Culture     Final   Value:        BLOOD CULTURE RECEIVED NO GROWTH TO DATE CULTURE WILL BE HELD FOR 5 DAYS BEFORE ISSUING A FINAL NEGATIVE REPORT     Performed at [facility]   Report Status PENDING   Incomplete     Studies: Dg Chest 2 View  07/22/2013   CLINICAL DATA:  Chest pain, shortness of breath, cough  EXAM: CHEST  2 VIEW  COMPARISON:  None.  FINDINGS: Consolidation involving the right lower lung, likely within the right middle lobe when correlating with the lateral view. No pleural effusion or pneumothorax.  Cardiomegaly.  Visualized osseous structures are within normal limits.  IMPRESSION: Suspected right middle lobe consolidation/pneumonia.   Electronically Signed   By: Charline Bills M.D.   On: 07/22/2013 17:47    Scheduled Meds: . albuterol  2.5 mg Nebulization TID  . azithromycin  500 mg Oral Q24H  . carvedilol  18.75 mg Oral BID WC  . cefTRIAXone (ROCEPHIN)  IV  1 g Intravenous Q24H  . digoxin  0.25 mg Oral Daily  . enoxaparin (LOVENOX) injection  40 mg Subcutaneous Q24H  . furosemide  40 mg Oral BID  . insulin aspart  0-15 Units Subcutaneous TID WC  . insulin glargine  10 Units Subcutaneous QHS  . Insulin Pen Starter Kit  1 kit Other Once  . losartan  50 mg Oral Daily  . oseltamivir  75 mg Oral Daily  . QUEtiapine  100 mg Oral QHS  . sertraline  100 mg Oral BID  . spironolactone  25 mg Oral Daily   Continuous Infusions:      Time spent: 35 minutes    [facility] A  [facility] Pager [phone] If 7PM-7AM, please contact night-coverage at [email or web address], password [email or web address] 07/23/2013, 2:44 PM  LOS: 1 day

## 2013-07-23 NOTE — Progress Notes (Signed)
Nutrition Brief Note  Patient identified on the Malnutrition Screening Tool (MST) Report. Pt reports that his usual weight ranges from 265 - 280 lb 2/2 fluid status. Currently down to 263 lb. States that his appetite has been diminished for 2 days but was eating well prior to that. Appears well-nourished.   Wt Readings from Last 15 Encounters:  07/22/13 263 lb 3.2 oz (119.387 kg)  07/21/13 280 lb (127.007 kg)  06/30/13 280 lb (127.007 kg)  03/04/13 267 lb 1.6 oz (121.156 kg)  12/05/12 269 lb 4 oz (122.131 kg)  10/30/12 271 lb (122.925 kg)  09/24/12 287 lb (130.182 kg)  08/26/12 288 lb (130.636 kg)  08/16/12 275 lb (124.739 kg)  02/29/12 280 lb (127.007 kg)  01/18/12 274 lb (124.286 kg)  12/29/11 272 lb (123.378 kg)  12/08/11 278 lb 12 oz (126.44 kg)  11/23/11 281 lb 8 oz (127.688 kg)  10/18/11 285 lb 8 oz (129.502 kg)    Body mass index is 35.69 kg/(m^2). Patient meets criteria for Obese Class II based on current BMI.   Current diet order is Carbohydrate Modified Medium, patient is consuming approximately 25-50% of meals at this time. Labs and medications reviewed.   No nutrition interventions warranted at this time. If nutrition issues arise, please consult RD.   Jarold Motto MS, RD, LDN Pager: [phone] After-hours pager: [phone]

## 2013-07-24 DIAGNOSIS — E876 Hypokalemia: Secondary | ICD-10-CM

## 2013-07-24 DIAGNOSIS — R55 Syncope and collapse: Secondary | ICD-10-CM

## 2013-07-24 LAB — CBC
Hemoglobin: 13.1 g/dL (ref 13.0–17.0)
MCV: 88.7 fL (ref 78.0–100.0)
Platelets: 153 10*3/uL (ref 150–400)
RDW: 12.3 % (ref 11.5–15.5)
WBC: 7.8 10*3/uL (ref 4.0–10.5)

## 2013-07-24 LAB — BASIC METABOLIC PANEL
BUN: 18 mg/dL (ref 6–23)
BUN: 16 mg/dL (ref 6–23)
Calcium: 7.9 mg/dL — ABNORMAL LOW (ref 8.4–10.5)
Calcium: 8.4 mg/dL (ref 8.4–10.5)
Chloride: 91 mEq/L — ABNORMAL LOW (ref 96–112)
Creatinine, Ser: 1.12 mg/dL (ref 0.50–1.35)
Creatinine, Ser: 1.03 mg/dL (ref 0.50–1.35)
GFR calc Af Amer: >90 mL/min (ref >90)
GFR calc Af Amer: >90 mL/min (ref >90)
GFR calc non Af Amer: 78 mL/min — ABNORMAL LOW (ref >90)
Sodium: 137 mEq/L (ref 135–145)
Sodium: 134 mEq/L — ABNORMAL LOW (ref 135–145)

## 2013-07-24 LAB — GLUCOSE, CAPILLARY
Glucose-Capillary: 220 mg/dL — ABNORMAL HIGH (ref 70–99)
Glucose-Capillary: 294 mg/dL — ABNORMAL HIGH (ref 70–99)
Glucose-Capillary: 266 mg/dL — ABNORMAL HIGH (ref 70–99)
Glucose-Capillary: 218 mg/dL — ABNORMAL HIGH (ref 70–99)

## 2013-07-24 LAB — EXPECTORATED SPUTUM ASSESSMENT W GRAM STAIN, RFLX TO RESP C

## 2013-07-24 MED ORDER — ALBUTEROL SULFATE (5 MG/ML) 0.5% IN NEBU: 2.5000 mg | INHALATION_SOLUTION | Freq: Four times a day (QID) | RESPIRATORY_TRACT | Status: DC | PRN

## 2013-07-24 MED ORDER — MAGNESIUM SULFATE 40 MG/ML IJ SOLN
2.0000 g | Freq: Once | INTRAMUSCULAR | Status: AC
  Administered 2013-07-24: 08:00:00 2 g via INTRAVENOUS
  Filled 2013-07-24: qty 50

## 2013-07-24 MED ORDER — POTASSIUM CHLORIDE CRYS ER 20 MEQ PO TBCR
60.0000 meq | EXTENDED_RELEASE_TABLET | Freq: Four times a day (QID) | ORAL | Status: AC
  Administered 2013-07-24 (×2): 60 meq via ORAL
  Filled 2013-07-24 (×2): qty 3

## 2013-07-24 MED ORDER — INSULIN GLARGINE 100 UNIT/ML ~~LOC~~ SOLN
30.0000 [IU] | Freq: Every day | SUBCUTANEOUS | Status: DC
  Administered 2013-07-24: 21:00:00 30 [IU] via SUBCUTANEOUS
  Filled 2013-07-24 (×2): qty 0.3

## 2013-07-24 NOTE — Progress Notes (Signed)
1015: pt complaining of right forearm hurting above the IV site. IV infusing stopped. Assessed IV site and flushed with 10cc NS, pt stated that did not hurt. Pt stated intense pain when arm is touched. Cap refill less than 3 sec, 2+ radial pulse on that extremity. MD notified. Verbal order for warm compress and to switch IV site. Will continue to monitor.

## 2013-07-24 NOTE — Progress Notes (Signed)
[facility] PROGRESS NOTE  JENE ORAVEC ZHY:[id number] DOB: Jun 29, 1971 DOA: 07/22/2013 PCP: Arvilla Meres, MD  HPI/Subjective: Shortness of breath, and pleuritic chest pain are improving. Still has high blood sugar.  Assessment/Plan: Principal Problem:   CAP (community acquired pneumonia) Active Problems:   DIABETES, TYPE 2   OBSTRUCTIVE SLEEP APNEA   HYPERTENSION   Chronic systolic heart failure   Community acquired bacterial pneumonia   Community acquired pneumonia -Patient started on Rocephin and azithromycin, flu PCR is negative. -Chest x-ray showed RML pneumonia. -Supportive management with bronchodilators, mucolytics, and the doses and oxygen as needed.  Diabetes mellitus type 2, uncontrolled -Hemoglobin A1c is 12.1 which is consistent with a mean blood glucose of 301. -Patient placed on insulin sliding scale, metformin held. -Started on 10 units of Lantus at bedtime, I will increase to 30 units.  Hypertension -Continue home medications.  Chronic systolic CHF -Continue home medications including carvedilol, digoxin and spironolactone. -Patient is on oral Lasix wall and as needed doses of IV Lasix.  Hypokalemia -This is likely secondary to diuresis, replete orally.  Code Status: Full code Family Communication: Plan discussed with the patient. Disposition Plan: Remains inpatient   Consultants:  None  Procedures:  None  Antibiotics:  None   Objective: Filed Vitals:   07/24/13 1000  BP:   Pulse: 86  Temp:   Resp:     Intake/Output Summary (Last 24 hours) at 07/24/13 1101 Last data filed at 07/24/13 0955  Gross per 24 hour  Intake    670 ml  Output      0 ml  Net    670 ml   Filed Weights   07/22/13 1939  Weight: 119.387 kg (263 lb 3.2 oz)    Exam: General: Alert and awake, oriented x3, not in any acute distress. HEENT: anicteric sclera, pupils reactive to light and accommodation, EOMI CVS: S1-S2 clear, no murmur rubs or  gallops Chest: clear to auscultation bilaterally, no wheezing, rales or rhonchi Abdomen: soft nontender, nondistended, normal bowel sounds, no organomegaly Extremities: no cyanosis, clubbing or edema noted bilaterally Neuro: Cranial nerves II-XII intact, no focal neurological deficits  Data Reviewed: Basic Metabolic Panel:  Recent Labs Lab 07/21/13 0928 07/22/13 1617 07/24/13 0425  NA 138 136 137  K 3.6 3.3* 2.9*  CL 99 95* 95*  CO2 27 28 30   GLUCOSE 339* 314* 226*  BUN 10 12 18   CREATININE 0.70 1.02 1.12  CALCIUM 8.9 8.2* 7.9*   Liver Function Tests:  Recent Labs Lab 07/21/13 0928  AST 22  ALT 24  ALKPHOS 80  BILITOT 0.6  PROT 7.1  ALBUMIN 3.6   No results found for this basename: LIPASE, AMYLASE,  in the last 168 hours No results found for this basename: AMMONIA,  in the last 168 hours CBC:  Recent Labs Lab 07/21/13 0858 07/22/13 1617 07/24/13 0425  WBC 9.5 8.5 7.8  NEUTROABS 8.0* 6.6  --   HGB 12.7* 13.2 13.1  HCT 37.2* 38.5* 38.5*  MCV 87.1 89.1 88.7  PLT 129* 136* 153   Cardiac Enzymes: No results found for this basename: CKTOTAL, CKMB, CKMBINDEX, TROPONINI,  in the last 168 hours BNP (last 3 results)  Recent Labs  08/17/12 0110 07/21/13 0928 07/22/13 1617  PROBNP 725.0* 378.7* 199.8*   CBG:  Recent Labs Lab 07/23/13 0741 07/23/13 1148 07/23/13 1708 07/23/13 2126 07/24/13 0805  GLUCAP 323* 234* 232* 276* 220*    Micro Recent Results (from the past 240 hour(s))  CULTURE, BLOOD (ROUTINE  X 2)     Status: None   Collection Time    07/22/13  4:20 PM      Result Value Range Status   Specimen Description BLOOD ARM RIGHT   Final   Special Requests BOTTLES DRAWN AEROBIC ONLY 10CC   Final   Culture  Setup Time     Final   Value: 07/22/2013 22:51     Performed at [facility]   Culture     Final   Value:        BLOOD CULTURE RECEIVED NO GROWTH TO DATE CULTURE WILL BE HELD FOR 5 DAYS BEFORE ISSUING A FINAL NEGATIVE REPORT      Performed at [facility]   Report Status PENDING   Incomplete  CULTURE, BLOOD (ROUTINE X 2)     Status: None   Collection Time    07/22/13  4:25 PM      Result Value Range Status   Specimen Description BLOOD HAND RIGHT   Final   Special Requests BOTTLES DRAWN AEROBIC ONLY 10CC   Final   Culture  Setup Time     Final   Value: 07/22/2013 22:51     Performed at [facility]   Culture     Final   Value:        BLOOD CULTURE RECEIVED NO GROWTH TO DATE CULTURE WILL BE HELD FOR 5 DAYS BEFORE ISSUING A FINAL NEGATIVE REPORT     Performed at [facility]   Report Status PENDING   Incomplete     Studies: Dg Chest 2 View  07/22/2013   CLINICAL DATA:  Chest pain, shortness of breath, cough  EXAM: CHEST  2 VIEW  COMPARISON:  None.  FINDINGS: Consolidation involving the right lower lung, likely within the right middle lobe when correlating with the lateral view. No pleural effusion or pneumothorax.  Cardiomegaly.  Visualized osseous structures are within normal limits.  IMPRESSION: Suspected right middle lobe consolidation/pneumonia.   Electronically Signed   By: Charline Bills M.D.   On: 07/22/2013 17:47    Scheduled Meds: . albuterol  2.5 mg Nebulization TID  . azithromycin  500 mg Oral Q24H  . carvedilol  18.75 mg Oral BID WC  . cefTRIAXone (ROCEPHIN)  IV  1 g Intravenous Q24H  . digoxin  0.25 mg Oral Daily  . enoxaparin (LOVENOX) injection  40 mg Subcutaneous Q24H  . furosemide  40 mg Oral BID  . insulin aspart  0-15 Units Subcutaneous TID WC  . insulin glargine  20 Units Subcutaneous QHS  . potassium chloride  60 mEq Oral Q6H  . QUEtiapine  100 mg Oral QHS  . sertraline  100 mg Oral BID  . spironolactone  25 mg Oral Daily   Continuous Infusions:      Time spent: 35 minutes    [facility] A  [facility] Pager [phone] If 7PM-7AM, please contact night-coverage at [email or web address], password [facility] 07/24/2013, 11:01 AM  LOS: 2 days

## 2013-07-24 NOTE — Progress Notes (Signed)
1257: Patient requesting to shower. MD notified and verbal order to d/c telemetry and okay to shower.

## 2013-07-25 DIAGNOSIS — J159 Unspecified bacterial pneumonia: Principal | ICD-10-CM

## 2013-07-25 LAB — BASIC METABOLIC PANEL
CO2: 30 mEq/L (ref 19–32)
Calcium: 8.1 mg/dL — ABNORMAL LOW (ref 8.4–10.5)
Creatinine, Ser: 0.95 mg/dL (ref 0.50–1.35)
GFR calc Af Amer: >90 mL/min (ref >90)
GFR calc non Af Amer: >90 mL/min (ref >90)
Sodium: 136 mEq/L (ref 135–145)

## 2013-07-25 LAB — MAGNESIUM: Magnesium: 1.7 mg/dL (ref 1.5–2.5)

## 2013-07-25 LAB — GLUCOSE, CAPILLARY: Glucose-Capillary: 332 mg/dL — ABNORMAL HIGH (ref 70–99)

## 2013-07-25 MED ORDER — POTASSIUM CHLORIDE CRYS ER 20 MEQ PO TBCR
60.0000 meq | EXTENDED_RELEASE_TABLET | Freq: Four times a day (QID) | ORAL | Status: DC
  Administered 2013-07-25: 60 meq via ORAL
  Filled 2013-07-25: qty 3

## 2013-07-25 MED ORDER — INSULIN GLARGINE 100 UNIT/ML ~~LOC~~ SOLN: 35.0000 [IU] | Freq: Every day | SUBCUTANEOUS | Status: DC

## 2013-07-25 MED ORDER — MAGNESIUM OXIDE 400 (241.3 MG) MG PO TABS: 400.0000 mg | ORAL_TABLET | Freq: Every day | ORAL | Status: DC

## 2013-07-25 MED ORDER — LEVOFLOXACIN 750 MG PO TABS: 750.0000 mg | ORAL_TABLET | Freq: Every day | ORAL | Status: DC

## 2013-07-25 MED ORDER — LOSARTAN POTASSIUM 25 MG PO TABS
25.0000 mg | ORAL_TABLET | Freq: Every day | ORAL | Status: DC
  Administered 2013-07-25: 25 mg via ORAL
  Filled 2013-07-25: qty 1

## 2013-07-25 MED ORDER — MAGNESIUM OXIDE 400 (241.3 MG) MG PO TABS
400.0000 mg | ORAL_TABLET | Freq: Every day | ORAL | Status: DC
  Administered 2013-07-25: 10:00:00 400 mg via ORAL
  Filled 2013-07-25: qty 1

## 2013-07-25 MED ORDER — SPIRONOLACTONE 50 MG PO TABS: 50.0000 mg | ORAL_TABLET | Freq: Every day | ORAL | Status: DC

## 2013-07-25 MED ORDER — INSULIN PEN STARTER KIT
1.0000 | Freq: Once | Status: AC
  Administered 2013-07-25: 1
  Filled 2013-07-25: qty 1

## 2013-07-25 MED ORDER — SPIRONOLACTONE 50 MG PO TABS
50.0000 mg | ORAL_TABLET | Freq: Every day | ORAL | Status: DC
  Administered 2013-07-25: 10:00:00 50 mg via ORAL
  Filled 2013-07-25: qty 1

## 2013-07-25 NOTE — Progress Notes (Signed)
Nsg Discharge Note  Admit Date:  07/22/2013 Discharge date: 07/25/2013   Mitchell [name] to be D/C'd Home per MD order.  AVS completed.  Copy for chart, and copy for patient signed, and dated. Patient/caregiver able to verbalize understanding.  Discharge Medication:   Medication List    STOP taking these medications       doxycycline 100 MG capsule  Commonly known as:  VIBRAMYCIN      TAKE these medications       carvedilol 12.5 MG tablet  Commonly known as:  COREG  Take 18.75 mg by mouth 2 (two) times daily with a meal.     digoxin 0.25 MG tablet  Commonly known as:  LANOXIN  Take 1 tablet (0.25 mg total) by mouth daily.     furosemide 40 MG tablet  Commonly known as:  LASIX  Take 1 tablet (40 mg total) by mouth 2 (two) times daily.     insulin glargine 100 UNIT/ML injection  Commonly known as:  LANTUS  Inject 0.35 mLs (35 Units total) into the skin at bedtime.     levofloxacin 750 MG tablet  Commonly known as:  LEVAQUIN  Take 1 tablet (750 mg total) by mouth daily.     losartan 50 MG tablet  Commonly known as:  COZAAR  Take 1 tablet (50 mg total) by mouth daily.     magnesium oxide 400 (241.3 MG) MG tablet  Commonly known as:  MAG-OX  Take 1 tablet (400 mg total) by mouth daily.     metFORMIN 1000 MG tablet  Commonly known as:  GLUCOPHAGE  Take 1,000 mg by mouth 2 (two) times daily with a meal.     ondansetron 4 MG tablet  Commonly known as:  ZOFRAN  Take 1 tablet (4 mg total) by mouth every 6 (six) hours.     QUEtiapine 100 MG tablet  Commonly known as:  SEROQUEL  Take 100 mg by mouth at bedtime.     sertraline 100 MG tablet  Commonly known as:  ZOLOFT  Take 100 mg by mouth 2 (two) times daily.     spironolactone 50 MG tablet  Commonly known as:  ALDACTONE  Take 1 tablet (50 mg total) by mouth daily.        Discharge Assessment: Filed Vitals:   07/25/13 0947  BP:   Pulse: 92  Temp:   Resp:    Skin clean, dry and intact without evidence  of skin break down, no evidence of skin tears noted. IV catheter discontinued intact. Site without signs and symptoms of complications - no redness or edema noted at insertion site, patient denies c/o pain - only slight tenderness at site.  Dressing with slight pressure applied.  D/c Instructions-Education: Discharge instructions given to patient/family with verbalized understanding. D/c education completed with patient/family including follow up instructions, medication list, d/c activities limitations if indicated, with other d/c instructions as indicated by MD - patient able to verbalize understanding, all questions fully answered. Patient instructed to return to ED, call 911, or call MD for any changes in condition.  Patient escorted via WC, and D/C home via private auto.  Kern Reap, RN 07/25/2013 1:54 PM

## 2013-07-25 NOTE — Discharge Summary (Signed)
Physician Discharge Summary  Mitchell [name] ZOX:[id number] DOB: 06/18/1971 DOA: 07/22/2013  PCP: Caffie Damme, MD  Admit date: 07/22/2013 Discharge date: 07/25/2013  Time spent: 40 minutes  Recommendations for Outpatient Follow-up:  1. Followup with primary care physician within one week. 2. Check BMP next week.  Discharge Diagnoses:  Principal Problem:   CAP (community acquired pneumonia) Active Problems:   Type II diabetes mellitus, uncontrolled   OBSTRUCTIVE SLEEP APNEA   HYPERTENSION   Chronic systolic heart failure   Community acquired bacterial pneumonia   Discharge Condition: Stable  Diet recommendation: Carb modified diet  Filed Weights   07/22/13 1939  Weight: 119.387 kg (263 lb 3.2 oz)    History of present illness:  42 year old obese male with history of hypertension, hyperlipidemia, obstructive sleep apnea noncompliant with CPAP, history of CVA, nonischemic cardiomyopathy with EF of 25-30% as per last echo, NYHA class III CHF, uncontrolled type 2 diabetes mellitus, history of MI, history of frequent PVCs who presented to [facility] yesterday with productive cough with whitish phlegm of 3 days duration. Patient also reported some chest congestion, increased dyspnea on exertion and orthopnea for possible 1 day. he also reported generalized body aches and backaches. He denied any headache, blurred vision, dizziness, nausea, vomiting, chest pain, palpitations, leg swelling, fevers, chills, abdominal pain, bowel or urinary symptoms. Denies any joint pains. Denies any nasal congestion or ear aches. Reports that his daughters had URI symptoms. He denies any recent travel. 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  Patient was found to have possible right-sided infiltrate and given a course of doxycycline by ED yesterday as his QTC was prolonged and sent home. He returned back to the [facility] today with worsening  productive cough (with a small amount blood tinged sputum this morning) with orthopnea.  A chest x-ray was repeated which showed right middle lobe pneumonia and [facility] call for admission. Patient was given a dose of IV Rocephin and azithromycin in the ED.  Hospital Course:   1. Community acquired pneumonia: Presented with fever, shortness of breath and cough. Chest x-ray showed right middle lobe pneumonia, patient started on IV Rocephin and azithromycin in the emergency department. His flu PCR is negative. His urine antigens for Legionella and Streptococcus was negative as well. Supportive management with bronchodilators, mucolytics as well as antitussives and oxygen as needed was provided. At the time of discharge Levaquin 750 for 5 more days prescribed.  2. Diabetes mellitus type 2, uncontrolled: Hemoglobin A1c is 16.3 which correlate with the plasma glucose of 421. Patient said he was recently started on Lantus insulin but he was not taking that. He did not remember the dose, patient initially was started at 10 units and dose titrated up to 35 units at night. His metformin also was continued, his Lantus dose I will need more titration, patient instructed to take his fasting blood glucose to his primary care physician visit next week for further titration of of the Lantus insulin dose. Hemoglobin A1c since 2008 never been less than 12, indicating poor glycemic control and patient himself might not be that involved in his care.  3. Chronic systolic CHF: LVEF from 2-D echo in April of 2014 was 25-30%, his CHF was compensated during this hospital stay, patient started on his home medications. Patient is on Coreg, Cozaar, Lasix and Aldactone.  4. Hypokalemia: This is likely secondary to diuresis, this is repleted orally. At  discharge patient placed on magnesium supplements, his Aldactone increased from 25-50 mg hopefully this will help hypokalemia. Patient might need potassium supplements as  outpatient. BMP to be checked next week.  5. Hypertension: Blood pressure is controlled, and its rather actually in the low side with all of the CHF medication patient is getting.  Procedures:  None  Consultations:  None  Discharge Exam: Filed Vitals:   07/25/13 0947  BP:   Pulse: 92  Temp:   Resp:    General: Alert and awake, oriented x3, not in any acute distress. HEENT: anicteric sclera, pupils reactive to light and accommodation, EOMI CVS: S1-S2 clear, no murmur rubs or gallops Chest: clear to auscultation bilaterally, no wheezing, rales or rhonchi Abdomen: soft nontender, nondistended, normal bowel sounds, no organomegaly Extremities: no cyanosis, clubbing or edema noted bilaterally Neuro: Cranial nerves II-XII intact, no focal neurological deficits  Discharge Instructions  Discharge Orders   Future Orders Complete By Expires   Diet - low sodium heart healthy  As directed    Increase activity slowly  As directed        Medication List    STOP taking these medications       doxycycline 100 MG capsule  Commonly known as:  VIBRAMYCIN      TAKE these medications       carvedilol 12.5 MG tablet  Commonly known as:  COREG  Take 18.75 mg by mouth 2 (two) times daily with a meal.     digoxin 0.25 MG tablet  Commonly known as:  LANOXIN  Take 1 tablet (0.25 mg total) by mouth daily.     furosemide 40 MG tablet  Commonly known as:  LASIX  Take 1 tablet (40 mg total) by mouth 2 (two) times daily.     insulin glargine 100 UNIT/ML injection  Commonly known as:  LANTUS  Inject 0.35 mLs (35 Units total) into the skin at bedtime.     levofloxacin 750 MG tablet  Commonly known as:  LEVAQUIN  Take 1 tablet (750 mg total) by mouth daily.     losartan 50 MG tablet  Commonly known as:  COZAAR  Take 1 tablet (50 mg total) by mouth daily.     magnesium oxide 400 (241.3 MG) MG tablet  Commonly known as:  MAG-OX  Take 1 tablet (400 mg total) by mouth daily.      metFORMIN 1000 MG tablet  Commonly known as:  GLUCOPHAGE  Take 1,000 mg by mouth 2 (two) times daily with a meal.     ondansetron 4 MG tablet  Commonly known as:  ZOFRAN  Take 1 tablet (4 mg total) by mouth every 6 (six) hours.     QUEtiapine 100 MG tablet  Commonly known as:  SEROQUEL  Take 100 mg by mouth at bedtime.     sertraline 100 MG tablet  Commonly known as:  ZOLOFT  Take 100 mg by mouth 2 (two) times daily.     spironolactone 50 MG tablet  Commonly known as:  ALDACTONE  Take 1 tablet (50 mg total) by mouth daily.       Allergies  Allergen Reactions  . Nsaids Hives       Follow-up Information   Follow up with Caffie Damme, MD In 1 week.   Specialty:  Family Medicine   Contact information:   [address] [place] [address] [phone]        The results of significant diagnostics from this hospitalization (including  imaging, microbiology, ancillary and laboratory) are listed below for reference.    Significant Diagnostic Studies: Dg Chest 2 View  07/22/2013   CLINICAL DATA:  Chest pain, shortness of breath, cough  EXAM: CHEST  2 VIEW  COMPARISON:  None.  FINDINGS: Consolidation involving the right lower lung, likely within the right middle lobe when correlating with the lateral view. No pleural effusion or pneumothorax.  Cardiomegaly.  Visualized osseous structures are within normal limits.  IMPRESSION: Suspected right middle lobe consolidation/pneumonia.   Electronically Signed   By: Charline Bills M.D.   On: 07/22/2013 17:47   Dg Chest 2 View  07/21/2013   CLINICAL DATA:  Cough and nausea  EXAM: CHEST  2 VIEW  COMPARISON:  August 17, 2012  FINDINGS: There is focal airspace consolidation axillary subsegment of the right upper lobe. Lungs are otherwise clear. Heart is mildly enlarged with normal pulmonary vascularity. No adenopathy. No bone lesions.  IMPRESSION: Focal airspace consolidation in the axillary subsegment right upper lobe. Lungs  otherwise clear. Heart is prominent but stable.   Electronically Signed   By: Bretta Bang M.D.   On: 07/21/2013 08:52    Microbiology: Recent Results (from the past 240 hour(s))  CULTURE, BLOOD (ROUTINE X 2)     Status: None   Collection Time    07/22/13  4:20 PM      Result Value Range Status   Specimen Description BLOOD ARM RIGHT   Final   Special Requests BOTTLES DRAWN AEROBIC ONLY 10CC   Final   Culture  Setup Time     Final   Value: 07/22/2013 22:51     Performed at [facility]   Culture     Final   Value:        BLOOD CULTURE RECEIVED NO GROWTH TO DATE CULTURE WILL BE HELD FOR 5 DAYS BEFORE ISSUING A FINAL NEGATIVE REPORT     Performed at [facility]   Report Status PENDING   Incomplete  CULTURE, BLOOD (ROUTINE X 2)     Status: None   Collection Time    07/22/13  4:25 PM      Result Value Range Status   Specimen Description BLOOD HAND RIGHT   Final   Special Requests BOTTLES DRAWN AEROBIC ONLY 10CC   Final   Culture  Setup Time     Final   Value: 07/22/2013 22:51     Performed at [facility]   Culture     Final   Value:        BLOOD CULTURE RECEIVED NO GROWTH TO DATE CULTURE WILL BE HELD FOR 5 DAYS BEFORE ISSUING A FINAL NEGATIVE REPORT     Performed at [facility]   Report Status PENDING   Incomplete  CULTURE, EXPECTORATED SPUTUM-ASSESSMENT     Status: None   Collection Time    07/24/13  8:01 PM      Result Value Range Status   Specimen Description SPUTUM   Final   Special Requests NONE   Final   Sputum evaluation     Final   Value: MICROSCOPIC FINDINGS SUGGEST THAT THIS SPECIMEN IS NOT REPRESENTATIVE OF LOWER RESPIRATORY SECRETIONS. PLEASE RECOLLECT.     Kenn File RN [id number] 07/24/13 A BROWNING   Report Status 07/24/2013 FINAL   Final     Labs: Basic Metabolic Panel:  Recent Labs Lab 07/21/13 0928 07/22/13 1617 07/24/13 0425 07/24/13 1440 07/25/13 0608  NA 138 136 137 134* 136  K 3.6 3.3* 2.9* 3.8 3.1*  CL  99 95* 95* 91* 94*  CO2 27 28 30 26 30   GLUCOSE 339* 314* 226* 287* 217*  BUN 10 12 18 16 16   CREATININE 0.70 1.02 1.12 1.03 0.95  CALCIUM 8.9 8.2* 7.9* 8.4 8.1*  MG  --   --   --   --  1.7   Liver Function Tests:  Recent Labs Lab 07/21/13 0928  AST 22  ALT 24  ALKPHOS 80  BILITOT 0.6  PROT 7.1  ALBUMIN 3.6   No results found for this basename: LIPASE, AMYLASE,  in the last 168 hours No results found for this basename: AMMONIA,  in the last 168 hours CBC:  Recent Labs Lab 07/21/13 0858 07/22/13 1617 07/24/13 0425  WBC 9.5 8.5 7.8  NEUTROABS 8.0* 6.6  --   HGB 12.7* 13.2 13.1  HCT 37.2* 38.5* 38.5*  MCV 87.1 89.1 88.7  PLT 129* 136* 153   Cardiac Enzymes: No results found for this basename: CKTOTAL, CKMB, CKMBINDEX, TROPONINI,  in the last 168 hours BNP: BNP (last 3 results)  Recent Labs  08/17/12 0110 07/21/13 0928 07/22/13 1617  PROBNP 725.0* 378.7* 199.8*   CBG:  Recent Labs Lab 07/24/13 0805 07/24/13 1156 07/24/13 1646 07/24/13 2110 07/25/13 0830  GLUCAP 220* 294* 266* 218* 199*       Signed:  Krystian Ferrentino A  [facility] 07/25/2013, 11:05 AM

## 2013-07-25 NOTE — Care Management Note (Signed)
    Page 1 of 1   07/25/2013     11:59:37 AM   CARE MANAGEMENT NOTE 07/25/2013  Patient:  Mitchell [name], Mitchell [name]   Account Number:  [id number]  Date Initiated:  07/23/2013  Documentation initiated by:  [facility]  Subjective/Objective Assessment:   admitted with pneumonia  from home with family     Action/Plan:   Anticipated DC Date:  07/25/2013   Anticipated DC Plan:  HOME/SELF CARE      DC Planning Services  CM consult      Choice offered to / List presented to:             Status of service:  Completed, signed off Medicare Important Message given?   (If response is "NO", the following Medicare IM given date fields will be blank) Date Medicare IM given:   Date Additional Medicare IM given:    Discharge Disposition:  HOME/SELF CARE  Per UR Regulation:  Reviewed for med. necessity/level of care/duration of stay  If discussed at Long Length of Stay Meetings, dates discussed:    Comments:  07/25/13 11:58 Letha Cape RN, BSN [phone] patient for dc to home today, No NCM referral , no needs anticpated.

## 2013-07-28 LAB — CULTURE, BLOOD (ROUTINE X 2)
Culture: NO GROWTH
Culture: NO GROWTH

## 2013-11-22 ENCOUNTER — Inpatient Hospital Stay (HOSPITAL_BASED_OUTPATIENT_CLINIC_OR_DEPARTMENT_OTHER)
Admission: EM | Admit: 2013-11-22 | Discharge: 2013-11-25 | DRG: 291 | Disposition: A | Discharge status: Home / Self Care | Payer: No Typology Code available for payment source | Attending: Internal Medicine | Admitting: Internal Medicine

## 2013-11-22 ENCOUNTER — Emergency Department (HOSPITAL_BASED_OUTPATIENT_CLINIC_OR_DEPARTMENT_OTHER): Payer: No Typology Code available for payment source

## 2013-11-22 ENCOUNTER — Encounter (HOSPITAL_BASED_OUTPATIENT_CLINIC_OR_DEPARTMENT_OTHER): Payer: Self-pay | Admitting: Emergency Medicine

## 2013-11-22 DIAGNOSIS — F411 Generalized anxiety disorder: Secondary | ICD-10-CM | POA: Diagnosis present

## 2013-11-22 DIAGNOSIS — D638 Anemia in other chronic diseases classified elsewhere: Secondary | ICD-10-CM | POA: Diagnosis present

## 2013-11-22 DIAGNOSIS — J159 Unspecified bacterial pneumonia: Secondary | ICD-10-CM

## 2013-11-22 DIAGNOSIS — J9601 Acute respiratory failure with hypoxia: Secondary | ICD-10-CM

## 2013-11-22 DIAGNOSIS — Z79899 Other long term (current) drug therapy: Secondary | ICD-10-CM

## 2013-11-22 DIAGNOSIS — E118 Type 2 diabetes mellitus with unspecified complications: Secondary | ICD-10-CM | POA: Diagnosis present

## 2013-11-22 DIAGNOSIS — I10 Essential (primary) hypertension: Secondary | ICD-10-CM

## 2013-11-22 DIAGNOSIS — E66813 Obesity, class 3: Secondary | ICD-10-CM | POA: Diagnosis present

## 2013-11-22 DIAGNOSIS — IMO0001 Reserved for inherently not codable concepts without codable children: Secondary | ICD-10-CM

## 2013-11-22 DIAGNOSIS — I509 Heart failure, unspecified: Secondary | ICD-10-CM

## 2013-11-22 DIAGNOSIS — Z91148 Patient's other noncompliance with medication regimen for other reason: Secondary | ICD-10-CM

## 2013-11-22 DIAGNOSIS — T502X5A Adverse effect of carbonic-anhydrase inhibitors, benzothiadiazides and other diuretics, initial encounter: Secondary | ICD-10-CM | POA: Diagnosis present

## 2013-11-22 DIAGNOSIS — J9620 Acute and chronic respiratory failure, unspecified whether with hypoxia or hypercapnia: Secondary | ICD-10-CM | POA: Diagnosis present

## 2013-11-22 DIAGNOSIS — G4733 Obstructive sleep apnea (adult) (pediatric): Secondary | ICD-10-CM

## 2013-11-22 DIAGNOSIS — Z91199 Patient's noncompliance with other medical treatment and regimen due to unspecified reason: Secondary | ICD-10-CM

## 2013-11-22 DIAGNOSIS — E785 Hyperlipidemia, unspecified: Secondary | ICD-10-CM

## 2013-11-22 DIAGNOSIS — Z833 Family history of diabetes mellitus: Secondary | ICD-10-CM

## 2013-11-22 DIAGNOSIS — E876 Hypokalemia: Secondary | ICD-10-CM

## 2013-11-22 DIAGNOSIS — I5022 Chronic systolic (congestive) heart failure: Secondary | ICD-10-CM

## 2013-11-22 DIAGNOSIS — I160 Hypertensive urgency: Secondary | ICD-10-CM

## 2013-11-22 DIAGNOSIS — F3289 Other specified depressive episodes: Secondary | ICD-10-CM | POA: Diagnosis present

## 2013-11-22 DIAGNOSIS — Z8249 Family history of ischemic heart disease and other diseases of the circulatory system: Secondary | ICD-10-CM

## 2013-11-22 DIAGNOSIS — Z860100 Personal history of colon polyps, unspecified: Secondary | ICD-10-CM

## 2013-11-22 DIAGNOSIS — D6959 Other secondary thrombocytopenia: Secondary | ICD-10-CM | POA: Diagnosis not present

## 2013-11-22 DIAGNOSIS — N529 Male erectile dysfunction, unspecified: Secondary | ICD-10-CM

## 2013-11-22 DIAGNOSIS — I2510 Atherosclerotic heart disease of native coronary artery without angina pectoris: Secondary | ICD-10-CM | POA: Diagnosis present

## 2013-11-22 DIAGNOSIS — I252 Old myocardial infarction: Secondary | ICD-10-CM

## 2013-11-22 DIAGNOSIS — I4949 Other premature depolarization: Secondary | ICD-10-CM

## 2013-11-22 DIAGNOSIS — I428 Other cardiomyopathies: Secondary | ICD-10-CM | POA: Diagnosis present

## 2013-11-22 DIAGNOSIS — F329 Major depressive disorder, single episode, unspecified: Secondary | ICD-10-CM | POA: Diagnosis present

## 2013-11-22 DIAGNOSIS — Z9119 Patient's noncompliance with other medical treatment and regimen: Secondary | ICD-10-CM

## 2013-11-22 DIAGNOSIS — E669 Obesity, unspecified: Secondary | ICD-10-CM

## 2013-11-22 DIAGNOSIS — I5023 Acute on chronic systolic (congestive) heart failure: Principal | ICD-10-CM

## 2013-11-22 DIAGNOSIS — E1165 Type 2 diabetes mellitus with hyperglycemia: Secondary | ICD-10-CM

## 2013-11-22 DIAGNOSIS — J189 Pneumonia, unspecified organism: Secondary | ICD-10-CM

## 2013-11-22 DIAGNOSIS — Z9114 Patient's other noncompliance with medication regimen: Secondary | ICD-10-CM

## 2013-11-22 DIAGNOSIS — Z8673 Personal history of transient ischemic attack (TIA), and cerebral infarction without residual deficits: Secondary | ICD-10-CM

## 2013-11-22 DIAGNOSIS — R55 Syncope and collapse: Secondary | ICD-10-CM

## 2013-11-22 LAB — DIGOXIN LEVEL: Digoxin Level: <0.3 ng/mL — ABNORMAL LOW (ref 0.8–2.0)

## 2013-11-22 LAB — CBC WITH DIFFERENTIAL/PLATELET
Basophils Absolute: 0 10*3/uL (ref 0.0–0.1)
Basophils Relative: 0 % (ref 0–1)
EOS ABS: 0.1 10*3/uL (ref 0.0–0.7)
Eosinophils Relative: 1 % (ref 0–5)
HEMATOCRIT: 36.2 % — AB (ref 39.0–52.0)
HEMOGLOBIN: 12.5 g/dL — AB (ref 13.0–17.0)
Lymphocytes Relative: 17 % (ref 12–46)
Lymphs Abs: 1.6 10*3/uL (ref 0.7–4.0)
MCH: 30.6 pg (ref 26.0–34.0)
MCHC: 34.5 g/dL (ref 30.0–36.0)
MCV: 88.7 fL (ref 78.0–100.0)
MONO ABS: 0.6 10*3/uL (ref 0.1–1.0)
MONOS PCT: 7 % (ref 3–12)
Neutro Abs: 7.3 10*3/uL (ref 1.7–7.7)
Neutrophils Relative %: 76 % (ref 43–77)
Platelets: 153 10*3/uL (ref 150–400)
RBC: 4.08 MIL/uL — ABNORMAL LOW (ref 4.22–5.81)
RDW: 12 % (ref 11.5–15.5)
WBC: 9.6 10*3/uL (ref 4.0–10.5)

## 2013-11-22 LAB — BASIC METABOLIC PANEL
BUN: 11 mg/dL (ref 6–23)
CO2: 26 mEq/L (ref 19–32)
CREATININE: 0.8 mg/dL (ref 0.50–1.35)
Calcium: 9.4 mg/dL (ref 8.4–10.5)
Chloride: 99 mEq/L (ref 96–112)
GLUCOSE: 411 mg/dL — AB (ref 70–99)
Potassium: 3.6 mEq/L — ABNORMAL LOW (ref 3.7–5.3)
Sodium: 140 mEq/L (ref 137–147)

## 2013-11-22 LAB — GLUCOSE, CAPILLARY
GLUCOSE-CAPILLARY: 292 mg/dL — AB (ref 70–99)
GLUCOSE-CAPILLARY: 299 mg/dL — AB (ref 70–99)
Glucose-Capillary: 369 mg/dL — ABNORMAL HIGH (ref 70–99)
Glucose-Capillary: 406 mg/dL — ABNORMAL HIGH (ref 70–99)

## 2013-11-22 LAB — COMPREHENSIVE METABOLIC PANEL
ALBUMIN: 3.1 g/dL — AB (ref 3.5–5.2)
ALT: 28 U/L (ref 0–53)
AST: 24 U/L (ref 0–37)
Alkaline Phosphatase: 93 U/L (ref 39–117)
BUN: 11 mg/dL (ref 6–23)
CALCIUM: 8.6 mg/dL (ref 8.4–10.5)
CO2: 22 mEq/L (ref 19–32)
CREATININE: 0.71 mg/dL (ref 0.50–1.35)
Chloride: 102 mEq/L (ref 96–112)
GFR calc Af Amer: >90 mL/min (ref >90)
Glucose, Bld: 374 mg/dL — ABNORMAL HIGH (ref 70–99)
Potassium: 3.4 mEq/L — ABNORMAL LOW (ref 3.7–5.3)
SODIUM: 141 meq/L (ref 137–147)
TOTAL PROTEIN: 6.5 g/dL (ref 6.0–8.3)
Total Bilirubin: 0.6 mg/dL (ref 0.3–1.2)

## 2013-11-22 LAB — TSH: TSH: 0.902 u[IU]/mL (ref 0.350–4.500)

## 2013-11-22 LAB — PRO B NATRIURETIC PEPTIDE
PRO B NATRI PEPTIDE: 533.6 pg/mL — AB (ref 0–125)
Pro B Natriuretic peptide (BNP): 597.7 pg/mL — ABNORMAL HIGH (ref 0–125)

## 2013-11-22 LAB — MAGNESIUM: Magnesium: 1.5 mg/dL (ref 1.5–2.5)

## 2013-11-22 LAB — GLUCOSE, RANDOM: Glucose, Bld: 361 mg/dL — ABNORMAL HIGH (ref 70–99)

## 2013-11-22 LAB — STREP PNEUMONIAE URINARY ANTIGEN: Strep Pneumo Urinary Antigen: NEGATIVE

## 2013-11-22 LAB — HEMOGLOBIN A1C
HEMOGLOBIN A1C: 14.1 % — AB (ref <5.7)
MEAN PLASMA GLUCOSE: 358 mg/dL — AB (ref <117)

## 2013-11-22 LAB — PHOSPHORUS: PHOSPHORUS: 4 mg/dL (ref 2.3–4.6)

## 2013-11-22 LAB — HIV ANTIBODY (ROUTINE TESTING W REFLEX): HIV: NONREACTIVE

## 2013-11-22 LAB — TROPONIN I: Troponin I: <0.3 ng/mL (ref <0.30)

## 2013-11-22 MED ORDER — INSULIN ASPART 100 UNIT/ML ~~LOC~~ SOLN
14.0000 [IU] | Freq: Once | SUBCUTANEOUS | Status: AC
  Administered 2013-11-22: 14 [IU] via SUBCUTANEOUS

## 2013-11-22 MED ORDER — INSULIN ASPART 100 UNIT/ML ~~LOC~~ SOLN
3.0000 [IU] | Freq: Three times a day (TID) | SUBCUTANEOUS | Status: DC
  Administered 2013-11-22 – 2013-11-23 (×2): 3 [IU] via SUBCUTANEOUS

## 2013-11-22 MED ORDER — SODIUM CHLORIDE 0.9 % IV SOLN: 250.0000 mL | INTRAVENOUS | Status: DC | PRN

## 2013-11-22 MED ORDER — LABETALOL HCL 5 MG/ML IV SOLN
20.0000 mg | Freq: Once | INTRAVENOUS | Status: AC
  Administered 2013-11-22: 20 mg via INTRAVENOUS
  Filled 2013-11-22: qty 4

## 2013-11-22 MED ORDER — MAGNESIUM OXIDE 400 (241.3 MG) MG PO TABS
400.0000 mg | ORAL_TABLET | Freq: Every day | ORAL | Status: DC
  Administered 2013-11-22 – 2013-11-25 (×4): 400 mg via ORAL
  Filled 2013-11-22 (×4): qty 1

## 2013-11-22 MED ORDER — SPIRONOLACTONE 50 MG PO TABS
50.0000 mg | ORAL_TABLET | Freq: Every day | ORAL | Status: DC
  Administered 2013-11-22 – 2013-11-25 (×4): 50 mg via ORAL
  Filled 2013-11-22 (×4): qty 1

## 2013-11-22 MED ORDER — SODIUM CHLORIDE 0.9 % IJ SOLN
3.0000 mL | Freq: Two times a day (BID) | INTRAMUSCULAR | Status: DC
  Administered 2013-11-22 – 2013-11-25 (×7): 3 mL via INTRAVENOUS

## 2013-11-22 MED ORDER — INSULIN ASPART 100 UNIT/ML ~~LOC~~ SOLN
0.0000 [IU] | Freq: Every day | SUBCUTANEOUS | Status: DC
  Administered 2013-11-22 – 2013-11-23 (×2): 3 [IU] via SUBCUTANEOUS
  Administered 2013-11-24: 4 [IU] via SUBCUTANEOUS

## 2013-11-22 MED ORDER — ENOXAPARIN SODIUM 60 MG/0.6ML ~~LOC~~ SOLN
60.0000 mg | SUBCUTANEOUS | Status: DC
  Administered 2013-11-23 – 2013-11-25 (×3): 60 mg via SUBCUTANEOUS
  Filled 2013-11-22 (×3): qty 0.6

## 2013-11-22 MED ORDER — QUETIAPINE FUMARATE 100 MG PO TABS
100.0000 mg | ORAL_TABLET | Freq: Every day | ORAL | Status: DC
  Administered 2013-11-22 – 2013-11-24 (×3): 100 mg via ORAL
  Filled 2013-11-22 (×4): qty 1

## 2013-11-22 MED ORDER — SERTRALINE HCL 100 MG PO TABS
100.0000 mg | ORAL_TABLET | Freq: Two times a day (BID) | ORAL | Status: DC
  Administered 2013-11-22 – 2013-11-25 (×7): 100 mg via ORAL
  Filled 2013-11-22 (×8): qty 1

## 2013-11-22 MED ORDER — INSULIN ASPART 100 UNIT/ML ~~LOC~~ SOLN
0.0000 [IU] | Freq: Three times a day (TID) | SUBCUTANEOUS | Status: DC
  Administered 2013-11-22: 5 [IU] via SUBCUTANEOUS
  Administered 2013-11-23: 3 [IU] via SUBCUTANEOUS
  Administered 2013-11-23: 7 [IU] via SUBCUTANEOUS
  Administered 2013-11-23 – 2013-11-24 (×3): 5 [IU] via SUBCUTANEOUS
  Administered 2013-11-24: 3 [IU] via SUBCUTANEOUS
  Administered 2013-11-25: 1 [IU] via SUBCUTANEOUS

## 2013-11-22 MED ORDER — FUROSEMIDE 10 MG/ML IJ SOLN
40.0000 mg | Freq: Once | INTRAMUSCULAR | Status: AC
  Administered 2013-11-22: 40 mg via INTRAVENOUS
  Filled 2013-11-22: qty 4

## 2013-11-22 MED ORDER — HYDROMORPHONE HCL PF 1 MG/ML IJ SOLN: 0.5000 mg | INTRAMUSCULAR | Status: DC | PRN

## 2013-11-22 MED ORDER — OXYCODONE-ACETAMINOPHEN 5-325 MG PO TABS
1.0000 | ORAL_TABLET | Freq: Three times a day (TID) | ORAL | Status: DC | PRN
  Filled 2013-11-22: qty 1

## 2013-11-22 MED ORDER — DIGOXIN 250 MCG PO TABS
0.2500 mg | ORAL_TABLET | Freq: Every day | ORAL | Status: DC
  Administered 2013-11-22 – 2013-11-25 (×4): 0.25 mg via ORAL
  Filled 2013-11-22 (×4): qty 1

## 2013-11-22 MED ORDER — DEXTROSE 5 % IV SOLN
1.0000 g | INTRAVENOUS | Status: DC
  Administered 2013-11-22 – 2013-11-25 (×4): 1 g via INTRAVENOUS
  Filled 2013-11-22 (×4): qty 10

## 2013-11-22 MED ORDER — SODIUM CHLORIDE 0.9 % IJ SOLN: 3.0000 mL | INTRAMUSCULAR | Status: DC | PRN

## 2013-11-22 MED ORDER — CARVEDILOL 6.25 MG PO TABS
18.7500 mg | ORAL_TABLET | Freq: Two times a day (BID) | ORAL | Status: DC
  Administered 2013-11-22 – 2013-11-23 (×2): 18.75 mg via ORAL
  Filled 2013-11-22 (×4): qty 1

## 2013-11-22 MED ORDER — ENOXAPARIN SODIUM 40 MG/0.4ML ~~LOC~~ SOLN
40.0000 mg | SUBCUTANEOUS | Status: DC
  Administered 2013-11-22: 40 mg via SUBCUTANEOUS
  Filled 2013-11-22: qty 0.4

## 2013-11-22 MED ORDER — DEXTROSE 5 % IV SOLN
500.0000 mg | INTRAVENOUS | Status: DC
  Administered 2013-11-22 – 2013-11-24 (×3): 500 mg via INTRAVENOUS
  Filled 2013-11-22 (×3): qty 500

## 2013-11-22 MED ORDER — LOSARTAN POTASSIUM 50 MG PO TABS
50.0000 mg | ORAL_TABLET | Freq: Every day | ORAL | Status: DC
  Administered 2013-11-22 – 2013-11-24 (×3): 50 mg via ORAL
  Filled 2013-11-22 (×3): qty 1

## 2013-11-22 MED ORDER — FUROSEMIDE 10 MG/ML IJ SOLN
40.0000 mg | Freq: Two times a day (BID) | INTRAMUSCULAR | Status: DC
  Administered 2013-11-22 – 2013-11-24 (×5): 40 mg via INTRAVENOUS
  Filled 2013-11-22 (×7): qty 4

## 2013-11-22 NOTE — Progress Notes (Signed)
Patient states that he no longer wears his CPAP because he machine no longer works.  Patient states that his MD wants him to have a sleep study so he can obtain another CPAP.

## 2013-11-22 NOTE — Progress Notes (Signed)
Placed patient on 2 liter nasal cannula to maintain a SPO2 >= 92%

## 2013-11-22 NOTE — ED Notes (Signed)
Pt states he is feeling better, wants to sleep, asking for fluids, informed him that I would check with physician, but that his po intake will have to be limited

## 2013-11-22 NOTE — H&P (Addendum)
[facility] History and Physical  Mitchell [name]:[id number] DOB: 1970/08/31 DOA: 11/22/2013  Referring physician: ED physician PCP: Caffie Damme, MD   Chief Complaint: shortness of breath   HPI:  Pt is 43 yo male with complex, multiple medical problems including systolic CHF (per last 2 D ECHO 10/2012) EF 25 - 30%, medical non compliance, diabetes, HTN, HLD, presented to [facility] ED with main concern of one week history of progressively worsening exertional shortness of breath, associated with subjective fevers, chills, cough productive of yellow sputum. He also noted 3 pillow orthopnea, LE swelling.   In ED, pt hypoxic upon arrival to ED with oxygen saturation in 80's, requiring oxygen. Also given Lasix and started on empiric ABX for presumptive PNA.   Assessment and Plan: Active Problems: Acute hypoxic respiratory failure - this appears to be multifactorial and secondary to systolic CHF, PNA - will admit to telemetry bed - continue Lasix IV and monitor clinical response - also place on empiric ABX and follow upon sputum analysis  Systolic CHF - last 2 D ECHO in 2014 with ef 25 -30% - place on Lasix 40 mg BID - weight on admission is 280 lbs - strict I's and O's and daily weights  PNA - ABX as noted above - mucinex for cough - sputum analysis pending  DM - continue Insulin per home medical regimen - check A1C HTN - continue home medical regimen  Hypokalemia - from lasix, repeat BMP in AM  Radiological Exams on Admission: Dg Chest 2 View   11/22/2013   Improved right middle lobe infiltrates since previous study with mild right lower lobe infiltrate now identified.  Enlargement of cardiac silhouette with pulmonary vascular congestion.    Code Status: Full Family Communication: Pt at bedside Disposition Plan: Admit for further evaluation    Review of Systems:  Constitutional: . Negative for diaphoresis.  HENT: Negative for hearing loss, ear pain, nosebleeds,  congestion, sore throat, neck pain, tinnitus and ear discharge.   Eyes: Negative for blurred vision, double vision, photophobia, pain, discharge and redness.  Respiratory: Negative for  wheezing and stridor.   Cardiovascular: Negative for chest pain Gastrointestinal:Negative for heartburn, constipation, blood in stool and melena.  Genitourinary: Negative for dysuria, urgency, frequency, hematuria and flank pain.  Musculoskeletal: Negative for myalgias, back pain, joint pain and falls.  Skin: Negative for itching and rash.  Neurological: Negative for tingling, speech change, focal weakness, loss of consciousness and headaches.  Endo/Heme/Allergies: Negative for environmental allergies and polydipsia. Does not bruise/bleed easily.  Psychiatric/Behavioral: Negative for suicidal ideas. The patient is not nervous/anxious.      Past Medical History  Diagnosis Date  . Hypertension   . Hyperlipidemia   . Depression   . OSA (obstructive sleep apnea)     On CPAP; NON-COMPLIANT  . CVA (cerebral infarction)     No residual deficits  . Non-ischemic cardiomyopathy     No ischemia on myoview, showed EF of 42%. 2D echo showed EF 50-55% with diatolic dysfunction in 2009  . Post-cardiac injury syndrome     History of cardiac injury from blunt trauma  . PVCs (premature ventricular contractions)   . History of colonic polyps   . Obesity   . Syncope     Recurrent, thought to be vasovagal. Also has h/o frequent PVCs.   . Chronic systolic heart failure 11/2010    a. NICM 4/14 EF 20-25%, TR mild  . SOB (shortness of breath)   . Stroke   .  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 Myocardial infarction   . Headache(784.0)   . Diabetes mellitus     TYPE II; UNCONTROLLED BY HEMOGLOBIN A1c; STABLE AS  PER DISCHARGE  . CHF (congestive heart failure)     Past Surgical History  Procedure Laterality Date  . Cardiac catheterization  12/19/10    DIFFUSE NONOBSTRUCTIVE CAD; NONISCHEMIC CARDIOMYOPATHY; LEFT  VENTRICULAR ANGIOGRAM WAS PERFORMED SECONDARY TO  ELEVATED LEFT VENTRICULAR FILLING PRESSURES  . Right thumb      Social History:  reports that he has never smoked. He does not have any smokeless tobacco history on file. He reports that he does not drink alcohol or use illicit drugs.  Allergies  Allergen Reactions  . Nsaids Hives    Family History  Problem Relation Age of Onset  . Heart disease Mother     MI  . Heart failure Mother   . Diabetes Mother     ALSO IN MOST OF HIS SIBLINGS; 2 UNLCES HAVE ALSO PASSED AWAY FROM DM  . Cardiomyopathy Mother     Prior to Admission medications   Medication Sig Start Date End Date Taking? Authorizing Provider  carvedilol (COREG) 12.5 MG tablet Take 18.75 mg by mouth 2 (two) times daily with a meal.    Historical Provider, MD  digoxin (LANOXIN) 0.25 MG tablet Take 1 tablet (0.25 mg total) by mouth daily. 10/30/12 12/05/13  Amy D Clegg, NP  furosemide (LASIX) 40 MG tablet Take 1 tablet (40 mg total) by mouth 2 (two) times daily. 03/04/13   Aundria Rud, NP  losartan (COZAAR) 50 MG tablet Take 1 tablet (50 mg total) by mouth daily. 12/05/12   Aundria Rud, NP  magnesium oxide (MAG-OX) 400 (241.3 MG) MG tablet Take 1 tablet (400 mg total) by mouth daily. 07/25/13   Clydia Llano, MD  metFORMIN (GLUCOPHAGE) 1000 MG tablet Take 1,000 mg by mouth 2 (two) times daily with a meal.    Historical Provider, MD  QUEtiapine (SEROQUEL) 100 MG tablet Take 100 mg by mouth at bedtime.    Historical Provider, MD  sertraline (ZOLOFT) 100 MG tablet Take 100 mg by mouth 2 (two) times daily.     Historical Provider, MD  spironolactone (ALDACTONE) 50 MG tablet Take 1 tablet (50 mg total) by mouth daily. 07/25/13 07/25/14  Clydia Llano, MD    Physical Exam: Filed Vitals:   11/22/13 0247 11/22/13 0305 11/22/13 0432 11/22/13 0634  BP: 144/95 157/105 144/95 151/99  Pulse:  95 94 99  Temp:    97.7 F (36.5 C)  TempSrc:    Oral  Resp:  22 20 19   Height:    6' (1.829 m)   Weight:    127.143 kg (280 lb 4.8 oz)  SpO2:  94% 96% 93%    Physical Exam  Constitutional: Appears well-developed and well-nourished. No distress.  HENT: Normocephalic. External right and left ear normal. Oropharynx is clear and moist.  Eyes: Conjunctivae and EOM are normal. PERRLA, no scleral icterus.  Neck: Normal ROM. Neck supple. No JVD. No tracheal deviation. No thyromegaly.  CVS: RRR, S1/S2 +, no murmurs, no gallops, no carotid bruit.  Pulmonary: Bibasilar crackles with rhonchi  Abdominal: Soft. BS +,  no distension, tenderness, rebound or guarding.  Musculoskeletal: Normal range of motion. + 2 lower extremity pitting edema Lymphadenopathy: No lymphadenopathy noted, cervical, inguinal. Neuro: Alert. Normal reflexes, muscle tone coordination. No cranial nerve deficit. Skin: Skin is warm and dry. No rash noted. Not diaphoretic.  No erythema. No pallor.  Psychiatric: Normal mood and affect. Behavior, judgment, thought content normal.   Labs on Admission:  Basic Metabolic Panel:  Recent Labs Lab 11/22/13 0126  NA 140  K 3.6*  CL 99  CO2 26  GLUCOSE 411*  BUN 11  CREATININE 0.80  CALCIUM 9.4   CBC:  Recent Labs Lab 11/22/13 0126  WBC 9.6  NEUTROABS 7.3  HGB 12.5*  HCT 36.2*  MCV 88.7  PLT 153   Cardiac Enzymes:  Recent Labs Lab 11/22/13 0126  TROPONINI <0.30   CBG:  Recent Labs Lab 11/22/13 0625  GLUCAP 369*    EKG: Normal sinus rhythm, no ST/T wave changes  Dorothea OgleIskra M Amaiya Scruton, MD  [facility] Pager [phone]  If 7PM-7AM, please contact night-coverage [email or web address] Password [email or web address] 11/22/2013, 9:00 AM

## 2013-11-22 NOTE — ED Notes (Signed)
Report provided to [facility], attempt to call report to floor, RN unavailable at this time. Number left for return call

## 2013-11-22 NOTE — ED Notes (Signed)
Onset x 1-2 weeks  gettingworse  Sob

## 2013-11-22 NOTE — ED Notes (Signed)
Patient transported to X-ray 

## 2013-11-22 NOTE — ED Provider Notes (Addendum)
CSN: [id number]     Arrival date & time 11/22/13  0049 History   First MD Initiated Contact with Patient 11/22/13 0216     Chief Complaint  Patient presents with  . Shortness of Breath     (Consider location/radiation/quality/duration/timing/severity/associated sxs/prior Treatment) HPI 43 year old male with multiple medical problems including congestive heart failure and diabetes. He admits he has been noncompliant with his medications for several months and states the only medication he is currently taking is carvedilol. He is here with about a one-week history of shortness of breath. This is worse with exertion and much worse with lying supine. Lying supine causes him to have paroxysms of cough forcing him to sit up. He is having chest wall pain with the cough. He denies nausea or vomiting. He has had diarrhea. He has had new onset swelling of his lower legs. He is here this morning because his symptoms have worsened. He describes it as moderate to severe.  Past Medical History  Diagnosis Date  . Hypertension   . Hyperlipidemia   . Depression   . OSA (obstructive sleep apnea)     On CPAP; NON-COMPLIANT  . CVA (cerebral infarction)     No residual deficits  . Non-ischemic cardiomyopathy     No ischemia on myoview, showed EF of 42%. 2D echo showed EF 50-55% with diatolic dysfunction in 2009  . Post-cardiac injury syndrome     History of cardiac injury from blunt trauma  . PVCs (premature ventricular contractions)   . History of colonic polyps   . Obesity   . Syncope     Recurrent, thought to be vasovagal. Also has h/o frequent PVCs.   . Chronic systolic heart failure 11/2010    a. NICM 4/14 EF 20-25%, TR mild  . SOB (shortness of breath)   . Stroke   . 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 Myocardial infarction   . Headache(784.0)   . Diabetes mellitus     TYPE II; UNCONTROLLED BY HEMOGLOBIN A1c; STABLE AS  PER DISCHARGE  . CHF (congestive heart failure)    Past Surgical  History  Procedure Laterality Date  . Cardiac catheterization  12/19/10    DIFFUSE NONOBSTRUCTIVE CAD; NONISCHEMIC CARDIOMYOPATHY; LEFT VENTRICULAR ANGIOGRAM WAS PERFORMED SECONDARY TO  ELEVATED LEFT VENTRICULAR FILLING PRESSURES  . Right thumb     Family History  Problem Relation Age of Onset  . Heart disease Mother     MI  . Heart failure Mother   . Diabetes Mother     ALSO IN MOST OF HIS SIBLINGS; 2 UNLCES HAVE ALSO PASSED AWAY FROM DM  . Cardiomyopathy Mother    History  Substance Use Topics  . Smoking status: Never Smoker   . Smokeless tobacco: Not on file  . Alcohol Use: No    Review of Systems  All other systems reviewed and are negative.   Allergies  Nsaids  Home Medications   Prior to Admission medications   Medication Sig Start Date End Date Taking? Authorizing Provider  carvedilol (COREG) 12.5 MG tablet Take 18.75 mg by mouth 2 (two) times daily with a meal.    Historical Provider, MD  digoxin (LANOXIN) 0.25 MG tablet Take 1 tablet (0.25 mg total) by mouth daily. 10/30/12 12/05/13  Amy D Clegg, NP  furosemide (LASIX) 40 MG tablet Take 1 tablet (40 mg total) by mouth 2 (two) times daily. 03/04/13   Aundria Rud, NP  losartan (COZAAR) 50 MG tablet Take 1 tablet (50  mg total) by mouth daily. 12/05/12   Aundria Rud, NP  magnesium oxide (MAG-OX) 400 (241.3 MG) MG tablet Take 1 tablet (400 mg total) by mouth daily. 07/25/13   Clydia Llano, MD  metFORMIN (GLUCOPHAGE) 1000 MG tablet Take 1,000 mg by mouth 2 (two) times daily with a meal.    Historical Provider, MD  QUEtiapine (SEROQUEL) 100 MG tablet Take 100 mg by mouth at bedtime.    Historical Provider, MD  sertraline (ZOLOFT) 100 MG tablet Take 100 mg by mouth 2 (two) times daily.     Historical Provider, MD  spironolactone (ALDACTONE) 50 MG tablet Take 1 tablet (50 mg total) by mouth daily. 07/25/13 07/25/14  Clydia Llano, MD   BP 208/118  Pulse 106  Temp(Src) 98.4 F (36.9 C) (Oral)  Resp 24  Ht 6' (1.829 m)  Wt  263 lb (119.296 kg)  BMI 35.66 kg/m2  SpO2 97%  Physical Exam General: Well-developed, well-nourished male in no acute distress; appearance consistent with age of record HENT: normocephalic; atraumatic; poor dentition with multiple caries Eyes: pupils equal, round and reactive to light; extraocular muscles intact Neck: supple Heart: regular rate and rhythm Lungs: Rales in bases bilaterally Abdomen: soft; nondistended; nontender; no masses or hepatosplenomegaly; bowel sounds present Extremities: No deformity; full range of motion; pulses normal; +1 pitting edema of lower leg Neurologic: Awake, alert and oriented; motor function intact in all extremities and symmetric; no facial droop Skin: Warm and dry Psychiatric: Normal mood and affect    ED Course  Procedures (including critical care time)   EKG Interpretation   Date/Time:  Saturday November 22 2013 01:02:56 EDT Ventricular Rate:  105 PR Interval:  172 QRS Duration: 114 QT Interval:  376 QTC Calculation: 496 R Axis:   -49 Text Interpretation:  Sinus tachycardia Possible Left atrial enlargement  Left axis deviation Incomplete left bundle branch block Left ventricular  hypertrophy Abnormal ECG PVCs seen previously Confirmed by Mitra Duling  MD,  Jonny Ruiz ([id number]) on 11/22/2013 1:26:05 AM      MDM   Nursing notes and vitals signs, including pulse oximetry, reviewed.  Summary of this visit's results, reviewed by myself:  Labs:  Results for orders placed during the hospital encounter of 11/22/13 (from the past 24 hour(s))  CBC WITH DIFFERENTIAL     Status: Abnormal   Collection Time    11/22/13  1:26 AM      Result Value Ref Range   WBC 9.6  4.0 - 10.5 K/uL   RBC 4.08 (*) 4.22 - 5.81 MIL/uL   Hemoglobin 12.5 (*) 13.0 - 17.0 g/dL   HCT 60.4 (*) 54.0 - 98.1 %   MCV 88.7  78.0 - 100.0 fL   MCH 30.6  26.0 - 34.0 pg   MCHC 34.5  30.0 - 36.0 g/dL   RDW 19.1  47.8 - 29.5 %   Platelets 153  150 - 400 K/uL   Neutrophils Relative % 76   43 - 77 %   Neutro Abs 7.3  1.7 - 7.7 K/uL   Lymphocytes Relative 17  12 - 46 %   Lymphs Abs 1.6  0.7 - 4.0 K/uL   Monocytes Relative 7  3 - 12 %   Monocytes Absolute 0.6  0.1 - 1.0 K/uL   Eosinophils Relative 1  0 - 5 %   Eosinophils Absolute 0.1  0.0 - 0.7 K/uL   Basophils Relative 0  0 - 1 %   Basophils Absolute 0.0  0.0 - 0.1  K/uL  BASIC METABOLIC PANEL     Status: Abnormal   Collection Time    11/22/13  1:26 AM      Result Value Ref Range   Sodium 140  137 - 147 mEq/L   Potassium 3.6 (*) 3.7 - 5.3 mEq/L   Chloride 99  96 - 112 mEq/L   CO2 26  19 - 32 mEq/L   Glucose, Bld 411 (*) 70 - 99 mg/dL   BUN 11  6 - 23 mg/dL   Creatinine, Ser 1.610.80  0.50 - 1.35 mg/dL   Calcium 9.4  8.4 - 09.610.5 mg/dL   GFR calc non Af Amer >90  >90 mL/min   GFR calc Af Amer >90  >90 mL/min  TROPONIN I     Status: None   Collection Time    11/22/13  1:26 AM      Result Value Ref Range   Troponin I <0.30  <0.30 ng/mL  DIGOXIN LEVEL     Status: Abnormal   Collection Time    11/22/13  1:26 AM      Result Value Ref Range   Digoxin Level <0.3 (*) 0.8 - 2.0 ng/mL  PRO B NATRIURETIC PEPTIDE     Status: Abnormal   Collection Time    11/22/13  1:26 AM      Result Value Ref Range   Pro B Natriuretic peptide (BNP) 597.7 (*) 0 - 125 pg/mL    Imaging Studies: Dg Chest 2 View  11/22/2013   CLINICAL DATA:  Shortness of breath, history CHF, hypertension, stroke, MI, diabetes  EXAM: CHEST  2 VIEW  COMPARISON:  07/22/2013  FINDINGS: Enlargement of cardiac silhouette with pulmonary vascular congestion.  Mediastinal contour stable.  Improved right middle lobe infiltrate versus previous exam.  Mild right lower lobe infiltrate is present.  Upper lungs clear.  No pleural effusion or pneumothorax.  IMPRESSION: Improved right middle lobe infiltrates since previous study with mild right lower lobe infiltrate now identified.  Enlargement of cardiac silhouette with pulmonary vascular congestion.   Electronically Signed   By:  Ulyses SouthwardMark  Boles M.D.   On: 11/22/2013 02:23   3:37 AM 450mL UOP after Lasix 40mg  IV. BP improved after labetalol 20mg  IV. Patient desaturates to 87% when ambulating short distances and becomes noticeably tachypneic. At rest oxygen saturation increases to 98%.  5:21 AM Patient has diuresed >1L. [facility]      Hanley SeamenJohn L Tayler Lassen, MD 11/22/13 [id number]  Hanley SeamenJohn L Hiedi Touchton, MD 11/22/13 [phone]

## 2013-11-22 NOTE — Progress Notes (Signed)
RT when to collect sputum sample from pt. Pt was unable to produce sputum and pt states that he has not being coughing or producing sputum. RT made pt aware that if he is able to, to please call RN or RT and we will send sample to lab.

## 2013-11-23 DIAGNOSIS — G4733 Obstructive sleep apnea (adult) (pediatric): Secondary | ICD-10-CM

## 2013-11-23 DIAGNOSIS — E1165 Type 2 diabetes mellitus with hyperglycemia: Secondary | ICD-10-CM

## 2013-11-23 DIAGNOSIS — IMO0001 Reserved for inherently not codable concepts without codable children: Secondary | ICD-10-CM

## 2013-11-23 LAB — GLUCOSE, CAPILLARY
GLUCOSE-CAPILLARY: 336 mg/dL — AB (ref 70–99)
Glucose-Capillary: 300 mg/dL — ABNORMAL HIGH (ref 70–99)
Glucose-Capillary: 210 mg/dL — ABNORMAL HIGH (ref 70–99)
Glucose-Capillary: 255 mg/dL — ABNORMAL HIGH (ref 70–99)

## 2013-11-23 LAB — CBC
HEMATOCRIT: 36.3 % — AB (ref 39.0–52.0)
Hemoglobin: 12.5 g/dL — ABNORMAL LOW (ref 13.0–17.0)
MCH: 30.5 pg (ref 26.0–34.0)
MCHC: 34.4 g/dL (ref 30.0–36.0)
MCV: 88.5 fL (ref 78.0–100.0)
Platelets: 145 10*3/uL — ABNORMAL LOW (ref 150–400)
RBC: 4.1 MIL/uL — ABNORMAL LOW (ref 4.22–5.81)
RDW: 12.8 % (ref 11.5–15.5)
WBC: 8.7 10*3/uL (ref 4.0–10.5)

## 2013-11-23 LAB — LEGIONELLA ANTIGEN, URINE: LEGIONELLA ANTIGEN, URINE: NEGATIVE

## 2013-11-23 LAB — PRO B NATRIURETIC PEPTIDE: Pro B Natriuretic peptide (BNP): 233.2 pg/mL — ABNORMAL HIGH (ref 0–125)

## 2013-11-23 LAB — BASIC METABOLIC PANEL
BUN: 12 mg/dL (ref 6–23)
CHLORIDE: 102 meq/L (ref 96–112)
CO2: 28 meq/L (ref 19–32)
Calcium: 8.6 mg/dL (ref 8.4–10.5)
Creatinine, Ser: 0.75 mg/dL (ref 0.50–1.35)
GFR calc non Af Amer: >90 mL/min (ref >90)
Glucose, Bld: 278 mg/dL — ABNORMAL HIGH (ref 70–99)
POTASSIUM: 3.1 meq/L — AB (ref 3.7–5.3)
Sodium: 145 mEq/L (ref 137–147)

## 2013-11-23 LAB — PROCALCITONIN: PROCALCITONIN: 0.14 ng/mL

## 2013-11-23 MED ORDER — INSULIN ASPART 100 UNIT/ML ~~LOC~~ SOLN: 8.0000 [IU] | Freq: Once | SUBCUTANEOUS | Status: DC

## 2013-11-23 MED ORDER — INSULIN DETEMIR 100 UNIT/ML ~~LOC~~ SOLN
20.0000 [IU] | Freq: Every day | SUBCUTANEOUS | Status: DC
  Administered 2013-11-23: 20 [IU] via SUBCUTANEOUS
  Filled 2013-11-23 (×2): qty 0.2

## 2013-11-23 MED ORDER — CARVEDILOL 25 MG PO TABS
25.0000 mg | ORAL_TABLET | Freq: Two times a day (BID) | ORAL | Status: DC
  Administered 2013-11-23 – 2013-11-25 (×4): 25 mg via ORAL
  Filled 2013-11-23 (×7): qty 1

## 2013-11-23 MED ORDER — VANCOMYCIN HCL 10 G IV SOLR
2500.0000 mg | Freq: Once | INTRAVENOUS | Status: AC
  Administered 2013-11-23: 2500 mg via INTRAVENOUS
  Filled 2013-11-23: qty 2500

## 2013-11-23 MED ORDER — POTASSIUM CHLORIDE CRYS ER 20 MEQ PO TBCR
40.0000 meq | EXTENDED_RELEASE_TABLET | Freq: Every day | ORAL | Status: DC
  Filled 2013-11-23 (×2): qty 2

## 2013-11-23 MED ORDER — INSULIN ASPART 100 UNIT/ML ~~LOC~~ SOLN: 5.0000 [IU] | Freq: Three times a day (TID) | SUBCUTANEOUS | Status: DC

## 2013-11-23 MED ORDER — POTASSIUM CHLORIDE 20 MEQ/15ML (10%) PO LIQD
40.0000 meq | Freq: Every day | ORAL | Status: DC
  Administered 2013-11-23 – 2013-11-25 (×3): 40 meq via ORAL
  Filled 2013-11-23 (×3): qty 30

## 2013-11-23 MED ORDER — VANCOMYCIN HCL 10 G IV SOLR
1250.0000 mg | Freq: Two times a day (BID) | INTRAVENOUS | Status: DC
  Administered 2013-11-24 – 2013-11-25 (×3): 1250 mg via INTRAVENOUS
  Filled 2013-11-23 (×5): qty 1250

## 2013-11-23 MED ORDER — INSULIN ASPART 100 UNIT/ML ~~LOC~~ SOLN
8.0000 [IU] | Freq: Three times a day (TID) | SUBCUTANEOUS | Status: DC
  Administered 2013-11-23: 8 [IU] via SUBCUTANEOUS

## 2013-11-23 MED ORDER — INSULIN DETEMIR 100 UNIT/ML ~~LOC~~ SOLN: 10.0000 [IU] | Freq: Every day | SUBCUTANEOUS | Status: DC

## 2013-11-23 NOTE — Progress Notes (Signed)
ANTIBIOTIC CONSULT NOTE - INITIAL  Pharmacy Consult for Vancomycin  Indication: Bacteremia  Allergies  Allergen Reactions  . Nsaids Hives    Patient Measurements: Height: 6' (182.9 cm) Weight: 272 lb 12.8 oz (123.741 kg) (scale c) IBW/kg (Calculated) : 77.6  Vital Signs: Temp: 97.4 F (36.3 C) (04/26 0440) Temp src: Oral (04/26 0440) BP: 150/100 mmHg (04/26 0440) Pulse Rate: 78 (04/26 0440) Intake/Output from previous day: 04/25 0701 - 04/26 0700 In: 600 [P.O.:600] Out: 4550 [Urine:4550] Intake/Output from this shift: Total I/O In: 120 [P.O.:120] Out: -   Labs:  Recent Labs  11/22/13 0126 11/22/13 1005 11/23/13 0420  WBC 9.6  --  8.7  HGB 12.5*  --  12.5*  PLT 153  --  145*  CREATININE 0.80 0.71 0.75   Estimated Creatinine Clearance: 158.3 ml/min (by C-G formula based on Cr of 0.75). No results found for this basename: VANCOTROUGH, Leodis Binet, VANCORANDOM, GENTTROUGH, GENTPEAK, GENTRANDOM, TOBRATROUGH, TOBRAPEAK, TOBRARND, AMIKACINPEAK, AMIKACINTROU, AMIKACIN,  in the last 72 hours   Microbiology: Recent Results (from the past 720 hour(s))  CULTURE, BLOOD (ROUTINE X 2)     Status: None   Collection Time    11/22/13 10:05 AM      Result Value Ref Range Status   Specimen Description BLOOD RIGHT ARM   Final   Special Requests BOTTLES DRAWN AEROBIC AND ANAEROBIC 5CC   Final   Culture  Setup Time     Final   Value: 11/22/2013 13:51     Performed at [facility]   Culture     Final   Value: GRAM POSITIVE COCCI IN CLUSTERS     Note: Gram Stain Report Called to,Read Back By and Verified With: William Hamburger 11/23/13 @ 12:46PM BY RUSCOE A.     Performed at [facility]   Report Status PENDING   Incomplete    Medical History: Past Medical History  Diagnosis Date  . Hypertension   . Hyperlipidemia   . Depression   . OSA (obstructive sleep apnea)     On CPAP; NON-COMPLIANT  . CVA (cerebral infarction)     No residual deficits  . Non-ischemic  cardiomyopathy     No ischemia on myoview, showed EF of 42%. 2D echo showed EF 50-55% with diatolic dysfunction in 2009  . Post-cardiac injury syndrome     History of cardiac injury from blunt trauma  . PVCs (premature ventricular contractions)   . History of colonic polyps   . Obesity   . Syncope     Recurrent, thought to be vasovagal. Also has h/o frequent PVCs.   . Chronic systolic heart failure 11/2010    a. NICM 4/14 EF 20-25%, TR mild  . SOB (shortness of breath)   . Stroke   . 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 Myocardial infarction   . Headache(784.0)   . Diabetes mellitus     TYPE II; UNCONTROLLED BY HEMOGLOBIN A1c; STABLE AS  PER DISCHARGE  . CHF (congestive heart failure)     Medications:  Scheduled:  . azithromycin  500 mg Intravenous Q24H  . carvedilol  18.75 mg Oral BID WC  . cefTRIAXone (ROCEPHIN)  IV  1 g Intravenous Q24H  . digoxin  0.25 mg Oral Daily  . enoxaparin (LOVENOX) injection  60 mg Subcutaneous Q24H  . furosemide  40 mg Intravenous BID  . insulin aspart  0-5 Units Subcutaneous QHS  . insulin aspart  0-9 Units Subcutaneous TID WC  . insulin aspart  8 Units Subcutaneous TID WC  . insulin detemir  20 Units Subcutaneous Daily  . losartan  50 mg Oral Daily  . magnesium oxide  400 mg Oral Daily  . potassium chloride  40 mEq Oral Daily  . QUEtiapine  100 mg Oral QHS  . sertraline  100 mg Oral BID  . sodium chloride  3 mL Intravenous Q12H  . spironolactone  50 mg Oral Daily   Assessment: 43 YOM admitted on 4/25 with SOB. Pharmacy has been consulted to dose vancomycin for bacteremia since cultures grew gram (+) cocci in clusters. Patient is afebrile with normal WBC. Renal function is stable with Scr 0.75 and CrCl ~17558mL/min. UOP 1.665ml/kg/hr. Note patient weight of 123.7kg.   4/25 BCx >> gram (+) cocci in clusters  Goal of Therapy:  Vancomycin trough level 15-20 mcg/ml  Plan:  - Loading dose of vancomycin 2500mg  x 1 - Then vancomycin 1250mg  IV  q12h - Monitor renal function, cultures, WBC, and temp - Obtain vancomycin trough at steady-state  Takila Kronberg A. Lenon AhmadiBinz, PharmD Clinical Pharmacist - Resident Pager: [phone] Pharmacy: [phone] 11/23/2013 1:19 PM

## 2013-11-23 NOTE — Progress Notes (Signed)
CRITICAL VALUE ALERT  Critical value received: +blood clusters Anerobic  Gram + cocci and clusters   Date of notification:  11/23/13  Time of notification:  1246  Critical value read back:yes  Nurse who received alert: Nigel Bridgeman  MD notified (1st page):  1300 MD on the unit and notified of results

## 2013-11-23 NOTE — Progress Notes (Signed)
[facility] PROGRESS NOTE  Mitchell [name] WSF:[id number] DOB: 1970/12/27 DOA: 11/22/2013 PCP: Caffie Damme, MD  Assessment/Plan  Acute hypoxic respiratory failure due to CAP, acute on chronic systolic heart failure -  Wean oxygen as tolerated, see below  CAP -  Blood culture growing GPC in clusters concerning for staph or possibly strep -  Add vancomycin -  Continue ceftriaxone and azithromycin -  Legionella and s. pneumo neg -  HIV NR -  Sputum culture -  procalcitonin mildly elevated  Blood culture positive for GPC -  Start Vancomycin -  Repeat BCx -  ECHO pending  Acute on chronic systolic CHF, last 2 D ECHO in 2014 with EF 25 -30%, pro BNP 233, BUN and creatinine stable -  ECHO -  -3.9L yesterday -  Weight down about 3kg  -  Continue Lasix 40 mg BID  -  Continue diuresis until BP or mild AKI -  On BB, dig, ARB, spironolactone  DM A1c 14.1 -  TDI ~ 60 units  -  Start with levemir 20 units daily to titrate up  -  aspart 8 units AC + low dose SSI -  HS insulin -  Trend CBG and increase insulin as needed -  RN to perform teaching -  Diabetes coordinator -  Nutrition education -  Change to diabetic diet   HTN, BP elevated - increase carvedilol - continue losartan   Hypokalemia, due to diuretics - start daily potassium  -  Repeat BMP in AM  Depression/anxiety, stable.  Continue zoloft and seroquel  Mild normocytic anemia and thrombocytopenia, no signs of bleeding, likely acute phase reactants -  Repeat CBC in AM  OSA, start CPAP each bedtime -  Will probably need outpatient sleep study -  We will try to arrange for DME home CPAP machine if possible  Diet:  diabetic Access:  PIV IVF:  off Proph:  lovenox  Code Status: Full Family Communication:  Patient alone Disposition Plan: pending further eval of positive blood culture, diuresis   Consultants:  None  Procedures:  CXR  ECHO  Antibiotics:  Ceftriaxone from 4/25 >>  Azithromycin  4/25 >>  Vancomycin 4/26 >>   HPI/Subjective:  This is shortness of breath and lower extremity edema are much better. He denies fevers, chills.  Objective: Filed Vitals:   11/22/13 2108 11/23/13 0130 11/23/13 0440 11/23/13 0447  BP: 129/86 126/69 150/100   Pulse: 97 85 78   Temp: 98.2 F (36.8 C) 97.9 F (36.6 C) 97.4 F (36.3 C)   TempSrc: Oral Oral Oral   Resp: 18 18 18    Height:      Weight:    123.741 kg (272 lb 12.8 oz)  SpO2: 96% 94% 95%     Intake/Output Summary (Last 24 hours) at 11/23/13 1314 Last data filed at 11/23/13 0935  Gross per 24 hour  Intake    600 ml  Output   3050 ml  Net  -2450 ml   Filed Weights   11/22/13 0101 11/22/13 0634 11/23/13 0447  Weight: 119.296 kg (263 lb) 127.143 kg (280 lb 4.8 oz) 123.741 kg (272 lb 12.8 oz)    Exam:   General:  Obese African American male, No acute distress  HEENT:  NCAT, MMM  Cardiovascular:  RRR, nl S1, S2 no mrg, 2+ pulses, warm extremities  Respiratory:  CTAB, no increased WOB  Abdomen:   NABS, soft, mildly distended, nontender  MSK:   Normal tone and bulk, 1+ bilateral  LEE  Neuro:  Grossly intact  Data Reviewed: Basic Metabolic Panel:  Recent Labs Lab 11/22/13 0126 11/22/13 1005 11/22/13 1400 11/23/13 0420  NA 140 141  --  145  K 3.6* 3.4*  --  3.1*  CL 99 102  --  102  CO2 26 22  --  28  GLUCOSE 411* 374* 361* 278*  BUN 11 11  --  12  CREATININE 0.80 0.71  --  0.75  CALCIUM 9.4 8.6  --  8.6  MG  --  1.5  --   --   PHOS  --  4.0  --   --    Liver Function Tests:  Recent Labs Lab 11/22/13 1005  AST 24  ALT 28  ALKPHOS 93  BILITOT 0.6  PROT 6.5  ALBUMIN 3.1*   No results found for this basename: LIPASE, AMYLASE,  in the last 168 hours No results found for this basename: AMMONIA,  in the last 168 hours CBC:  Recent Labs Lab 11/22/13 0126 11/23/13 0420  WBC 9.6 8.7  NEUTROABS 7.3  --   HGB 12.5* 12.5*  HCT 36.2* 36.3*  MCV 88.7 88.5  PLT 153 145*   Cardiac  Enzymes:  Recent Labs Lab 11/22/13 0126  TROPONINI <0.30   BNP (last 3 results)  Recent Labs  11/22/13 0126 11/22/13 1005 11/23/13 0420  PROBNP 597.7* 533.6* 233.2*   CBG:  Recent Labs Lab 11/22/13 1121 11/22/13 1616 11/22/13 2111 11/23/13 0545 11/23/13 1119  GLUCAP 406* 292* 299* 300* 336*    Recent Results (from the past 240 hour(s))  CULTURE, BLOOD (ROUTINE X 2)     Status: None   Collection Time    11/22/13 10:05 AM      Result Value Ref Range Status   Specimen Description BLOOD RIGHT ARM   Final   Special Requests BOTTLES DRAWN AEROBIC AND ANAEROBIC 5CC   Final   Culture  Setup Time     Final   Value: 11/22/2013 13:51     [facility]formed at [facility]   Culture     Final   Value: GRAM POSITIVE COCCI IN CLUSTERS     Note: Gram Stain Report Called to,Read Back By and Verified With: William HamburgerASHA UPHAN 11/23/13 @ 12:46PM BY RUSCOE A.     Performed at [facility]   Report Status PENDING   Incomplete     Studies: Dg Chest 2 View  11/22/2013   CLINICAL DATA:  Shortness of breath, history CHF, hypertension, stroke, MI, diabetes  EXAM: CHEST  2 VIEW  COMPARISON:  07/22/2013  FINDINGS: Enlargement of cardiac silhouette with pulmonary vascular congestion.  Mediastinal contour stable.  Improved right middle lobe infiltrate versus previous exam.  Mild right lower lobe infiltrate is present.  Upper lungs clear.  No pleural effusion or pneumothorax.  IMPRESSION: Improved right middle lobe infiltrates since previous study with mild right lower lobe infiltrate now identified.  Enlargement of cardiac silhouette with pulmonary vascular congestion.   Electronically Signed   By: Ulyses SouthwardMark  Boles M.D.   On: 11/22/2013 02:23    Scheduled Meds: . azithromycin  500 mg Intravenous Q24H  . carvedilol  18.75 mg Oral BID WC  . cefTRIAXone (ROCEPHIN)  IV  1 g Intravenous Q24H  . digoxin  0.25 mg Oral Daily  . enoxaparin (LOVENOX) injection  60 mg Subcutaneous Q24H  . furosemide  40 mg  Intravenous BID  . insulin aspart  0-5 Units Subcutaneous QHS  . insulin aspart  0-9 Units Subcutaneous TID WC  . insulin aspart  8 Units Subcutaneous TID WC  . insulin detemir  20 Units Subcutaneous Daily  . losartan  50 mg Oral Daily  . magnesium oxide  400 mg Oral Daily  . potassium chloride  40 mEq Oral Daily  . QUEtiapine  100 mg Oral QHS  . sertraline  100 mg Oral BID  . sodium chloride  3 mL Intravenous Q12H  . spironolactone  50 mg Oral Daily   Continuous Infusions:   Active Problems:   CHF (congestive heart failure)   PNA (pneumonia)    Time spent: 30 min    Renae Fickle  [facility] Pager [phone]. If 7PM-7AM, please contact night-coverage at [email or web address], password [facility] 11/23/2013, 1:14 PM  LOS: 1 day

## 2013-11-24 ENCOUNTER — Encounter (HOSPITAL_COMMUNITY): Payer: Self-pay | Admitting: *Deleted

## 2013-11-24 DIAGNOSIS — I059 Rheumatic mitral valve disease, unspecified: Secondary | ICD-10-CM

## 2013-11-24 LAB — BASIC METABOLIC PANEL
BUN: 12 mg/dL (ref 6–23)
CHLORIDE: 101 meq/L (ref 96–112)
CO2: 25 mEq/L (ref 19–32)
CREATININE: 0.69 mg/dL (ref 0.50–1.35)
Calcium: 8.4 mg/dL (ref 8.4–10.5)
GFR calc Af Amer: >90 mL/min (ref >90)
GFR calc non Af Amer: >90 mL/min (ref >90)
Glucose, Bld: 242 mg/dL — ABNORMAL HIGH (ref 70–99)
Potassium: 3.3 mEq/L — ABNORMAL LOW (ref 3.7–5.3)
Sodium: 140 mEq/L (ref 137–147)

## 2013-11-24 LAB — CBC
HEMATOCRIT: 37.5 % — AB (ref 39.0–52.0)
Hemoglobin: 12.5 g/dL — ABNORMAL LOW (ref 13.0–17.0)
MCH: 29.8 pg (ref 26.0–34.0)
MCHC: 33.3 g/dL (ref 30.0–36.0)
MCV: 89.3 fL (ref 78.0–100.0)
Platelets: 148 10*3/uL — ABNORMAL LOW (ref 150–400)
RBC: 4.2 MIL/uL — ABNORMAL LOW (ref 4.22–5.81)
RDW: 12.7 % (ref 11.5–15.5)
WBC: 8.4 10*3/uL (ref 4.0–10.5)

## 2013-11-24 LAB — GLUCOSE, CAPILLARY
GLUCOSE-CAPILLARY: 282 mg/dL — AB (ref 70–99)
GLUCOSE-CAPILLARY: 211 mg/dL — AB (ref 70–99)
GLUCOSE-CAPILLARY: 312 mg/dL — AB (ref 70–99)
Glucose-Capillary: 274 mg/dL — ABNORMAL HIGH (ref 70–99)

## 2013-11-24 MED ORDER — LOSARTAN POTASSIUM 50 MG PO TABS
100.0000 mg | ORAL_TABLET | Freq: Every day | ORAL | Status: DC
  Administered 2013-11-25: 100 mg via ORAL
  Filled 2013-11-24: qty 2

## 2013-11-24 MED ORDER — ISOSORB DINITRATE-HYDRALAZINE 20-37.5 MG PO TABS
1.0000 | ORAL_TABLET | Freq: Two times a day (BID) | ORAL | Status: DC
  Filled 2013-11-24 (×2): qty 1

## 2013-11-24 MED ORDER — INSULIN DETEMIR 100 UNIT/ML ~~LOC~~ SOLN
30.0000 [IU] | Freq: Every day | SUBCUTANEOUS | Status: DC
  Administered 2013-11-24: 30 [IU] via SUBCUTANEOUS
  Filled 2013-11-24 (×2): qty 0.3

## 2013-11-24 MED ORDER — FUROSEMIDE 10 MG/ML IJ SOLN
40.0000 mg | Freq: Three times a day (TID) | INTRAMUSCULAR | Status: DC
  Administered 2013-11-24 – 2013-11-25 (×3): 40 mg via INTRAVENOUS
  Filled 2013-11-24 (×3): qty 4

## 2013-11-24 MED ORDER — ISOSORB DINITRATE-HYDRALAZINE 20-37.5 MG PO TABS
1.0000 | ORAL_TABLET | Freq: Three times a day (TID) | ORAL | Status: DC
  Administered 2013-11-24 – 2013-11-25 (×3): 1 via ORAL
  Filled 2013-11-24 (×5): qty 1

## 2013-11-24 MED ORDER — INSULIN ASPART 100 UNIT/ML ~~LOC~~ SOLN
10.0000 [IU] | Freq: Three times a day (TID) | SUBCUTANEOUS | Status: DC
  Administered 2013-11-24 (×2): 10 [IU] via SUBCUTANEOUS

## 2013-11-24 MED ORDER — LIVING WELL WITH DIABETES BOOK
Freq: Once | Status: AC
  Administered 2013-11-24: 11:00:00
  Filled 2013-11-24: qty 1

## 2013-11-24 NOTE — Progress Notes (Addendum)
[facility] PROGRESS NOTE  Mitchell [name] SJG:[id number] DOB: Dec 24, 1970 DOA: 11/22/2013 PCP: Caffie Damme, MD  Assessment/Plan   Acute hypoxic respiratory failure due to CAP, acute on chronic systolic heart failure, resolved, on RA  CAP -  Blood culture 1 out of 2 growing Staph, speciation not complete -  BCx from 4/26, NGTD -  Continue ceftriaxone and azithromycin day 3 -  Legionella and s. pneumo neg -  HIV NR -  Sputum culture  Blood culture positive for Staph, speciation not complete.  May be contaminate -  Continue Vancomycin -  Repeat BCx NGTD -  F/u speciation -  ECHO report pending  Acute on chronic systolic CHF, last 2 D ECHO in 2014 with EF 25 -30%, pro BNP 233, BUN and creatinine stable -  ECHO report pending -  +225mL yesterday -  Weight up 2 kg  -  Increase Lasix to 40 mg TID  -  Continue diuresis until BP or mild AKI -  On BB, dig, ARB, spironolactone -  Tele:  NSR   -  Needs close follow up for ICD placement  DM A1c 14.1 -  Increase levemir to 30 units daily -  Increase aspart to 10 units AC + low dose SSI -  HS insulin -  Trend CBG and increase insulin as needed -  RN to perform teaching -  Diabetes coordinator -  Nutrition education   HTN, BP elevated - continue carvedilol - increase losartan  - start bidil  Hypokalemia, due to diuretics and trending up - continue daily potassium  -  Repeat BMP in AM  Depression/anxiety, stable.  Continue zoloft and seroquel  Mild normocytic anemia and thrombocytopenia, no signs of bleeding, likely acute phase reactants -  Repeat CBC in AM  OSA, start CPAP each bedtime  -  Will probably need outpatient sleep study -  We will try to arrange for DME home CPAP machine if possible  Diet:  diabetic Access:  PIV IVF:  off Proph:  lovenox  Code Status: Full Family Communication:  Patient alone Disposition Plan: pending further eval of positive blood culture, diuresis.      Consultants:  None  Procedures:  CXR  ECHO  Antibiotics:  Ceftriaxone from 4/25 >>  Azithromycin 4/25 >>4/27  Vancomycin 4/26 >>   HPI/Subjective:  shortness of breath and lower extremity edema are much better. He denies fevers, chills.  Objective: Filed Vitals:   11/23/13 2059 11/23/13 2334 11/24/13 0538 11/24/13 0900  BP: 132/80  142/85 140/92  Pulse: 87 88 91 92  Temp: 97.9 F (36.6 C)  97.7 F (36.5 C)   TempSrc: Oral  Oral   Resp: 18 18 19    Height:      Weight:   125.6 kg (276 lb 14.4 oz)   SpO2: 96%  99% 97%    Intake/Output Summary (Last 24 hours) at 11/24/13 1055 Last data filed at 11/24/13 1000  Gross per 24 hour  Intake   1083 ml  Output   2250 ml  Net  -1167 ml   Filed Weights   11/22/13 0634 11/23/13 0447 11/24/13 0538  Weight: 127.143 kg (280 lb 4.8 oz) 123.741 kg (272 lb 12.8 oz) 125.6 kg (276 lb 14.4 oz)    Exam:   General:  Obese African American male, No acute distress  HEENT:  NCAT, MMM  Cardiovascular:  RRR, nl S1, S2 no mrg, 2+ pulses, warm extremities  Respiratory:  CTAB, no increased WOB  Abdomen:  NABS, soft, mildly distended, nontender  MSK:   Normal tone and bulk, trace bilateral LEE  Neuro:  Grossly intact  Data Reviewed: Basic Metabolic Panel:  Recent Labs Lab 11/22/13 0126 11/22/13 1005 11/22/13 1400 11/23/13 0420 11/24/13 0854  NA 140 141  --  145 140  K 3.6* 3.4*  --  3.1* 3.3*  CL 99 102  --  102 101  CO2 26 22  --  28 25  GLUCOSE 411* 374* 361* 278* 242*  BUN 11 11  --  12 12  CREATININE 0.80 0.71  --  0.75 0.69  CALCIUM 9.4 8.6  --  8.6 8.4  MG  --  1.5  --   --   --   PHOS  --  4.0  --   --   --    Liver Function Tests:  Recent Labs Lab 11/22/13 1005  AST 24  ALT 28  ALKPHOS 93  BILITOT 0.6  PROT 6.5  ALBUMIN 3.1*   No results found for this basename: LIPASE, AMYLASE,  in the last 168 hours No results found for this basename: AMMONIA,  in the last 168 hours CBC:  Recent  Labs Lab 11/22/13 0126 11/23/13 0420 11/24/13 0854  WBC 9.6 8.7 8.4  NEUTROABS 7.3  --   --   HGB 12.5* 12.5* 12.5*  HCT 36.2* 36.3* 37.5*  MCV 88.7 88.5 89.3  PLT 153 145* 148*   Cardiac Enzymes:  Recent Labs Lab 11/22/13 0126  TROPONINI <0.30   BNP (last 3 results)  Recent Labs  11/22/13 0126 11/22/13 1005 11/23/13 0420  PROBNP 597.7* 533.6* 233.2*   CBG:  Recent Labs Lab 11/23/13 0545 11/23/13 1119 11/23/13 1633 11/23/13 2123 11/24/13 0642  GLUCAP 300* 336* 210* 255* 274*    Recent Results (from the past 240 hour(s))  CULTURE, BLOOD (ROUTINE X 2)     Status: None   Collection Time    11/22/13  9:57 AM      Result Value Ref Range Status   Specimen Description BLOOD LEFT HAND   Final   Special Requests BOTTLES DRAWN AEROBIC AND ANAEROBIC 10CC   Final   Culture  Setup Time     Final   Value: 11/22/2013 13:51     Performed at [facility]   Culture     Final   Value:        BLOOD CULTURE RECEIVED NO GROWTH TO DATE CULTURE WILL BE HELD FOR 5 DAYS BEFORE ISSUING A FINAL NEGATIVE REPORT     Performed at [facility]   Report Status PENDING   Incomplete  CULTURE, BLOOD (ROUTINE X 2)     Status: None   Collection Time    11/22/13 10:05 AM      Result Value Ref Range Status   Specimen Description BLOOD RIGHT ARM   Final   Special Requests BOTTLES DRAWN AEROBIC AND ANAEROBIC 5CC   Final   Culture  Setup Time     Final   Value: 11/22/2013 13:51     Performed at [facility]   Culture     Final   Value: STAPHYLOCOCCUS SPECIES     Note: Gram Stain Report Called to,Read Back By and Verified With: William Hamburger 11/23/13 @ 12:46PM BY RUSCOE A.     Performed at [facility]   Report Status PENDING   Incomplete  CULTURE, BLOOD (ROUTINE X 2)     Status: None   Collection Time  11/23/13  2:50 PM      Result Value Ref Range Status   Specimen Description BLOOD RIGHT ARM   Final   Special Requests BOTTLES DRAWN AEROBIC ONLY 10CC    Final   Culture  Setup Time     Final   Value: 11/23/2013 20:03     [facility]   Culture     Final   Value:        BLOOD CULTURE RECEIVED NO GROWTH TO DATE CULTURE WILL BE HELD FOR 5 DAYS BEFORE ISSUING A FINAL NEGATIVE REPORT     [facility]   Report Status PENDING   Incomplete  CULTURE, BLOOD (ROUTINE X 2)     Status: None   Collection Time    11/23/13  2:55 PM      Result Value Ref Range Status   Specimen Description BLOOD LEFT HAND   Final   Special Requests BOTTLES DRAWN AEROBIC ONLY 10CC   Final   Culture  Setup Time     Final   Value: 11/23/2013 20:03     [facility]   Culture     Final   Value:        BLOOD CULTURE RECEIVED NO GROWTH TO DATE CULTURE WILL BE HELD FOR 5 DAYS BEFORE ISSUING A FINAL NEGATIVE REPORT     [facility]   Report Status PENDING   Incomplete     Studies: No results found.  Scheduled Meds: . azithromycin  500 mg Intravenous Q24H  . carvedilol  25 mg Oral BID WC  . cefTRIAXone (ROCEPHIN)  IV  1 g Intravenous Q24H  . digoxin  0.25 mg Oral Daily  . enoxaparin (LOVENOX) injection  60 mg Subcutaneous Q24H  . furosemide  40 mg Intravenous BID  . insulin aspart  0-5 Units Subcutaneous QHS  . insulin aspart  0-9 Units Subcutaneous TID WC  . insulin aspart  10 Units Subcutaneous TID WC  . insulin aspart  8 Units Subcutaneous Once  . insulin detemir  30 Units Subcutaneous Daily  . living well with diabetes book   Does not apply Once  . losartan  50 mg Oral Daily  . magnesium oxide  400 mg Oral Daily  . potassium chloride  40 mEq Oral Daily  . QUEtiapine  100 mg Oral QHS  . sertraline  100 mg Oral BID  . sodium chloride  3 mL Intravenous Q12H  . spironolactone  50 mg Oral Daily  . vancomycin  1,250 mg Intravenous Q12H   Continuous Infusions:   Active Problems:   CHF (congestive heart failure)   PNA (pneumonia)    Time spent: 30 min    Renae FickleMackenzie  Shonica Weier  [facility] Pager [phone]. If 7PM-7AM, please contact night-coverage at [email or web address], password [email or web address] 11/24/2013, 10:55 AM  LOS: 2 days

## 2013-11-24 NOTE — Progress Notes (Signed)
  RD consulted for nutrition education regarding diabetes.   Lab Results  Component Value Date   HGBA1C 14.1* 11/22/2013   Pt states he has not been taking his medications which he thinks has contributed to his elevated HgbA1C. He also reports that he has received diabetes diet education before and he knows what he should be eating/avoiding but, he hasn't been following the diet like he should be. Pt states he understands the importance of following a diet and is motivated to make changes.   RD provided "Carbohydrate Counting for People with Diabetes" handout from the Academy of Nutrition and Dietetics. Discussed different food groups and their effects on blood sugar, emphasizing carbohydrate-containing foods. Provided list of carbohydrates and recommended serving sizes of common foods.  Discussed importance of controlled and consistent carbohydrate intake throughout the day. Provided examples of ways to balance meals/snacks and encouraged intake of high-fiber, whole grain complex carbohydrates. Teach back method used. Pt states he will stop drinking Koolaid and stop eating sweets. He plans to start eating baked foods instead of fried, brown rice instead of white, and plans on eating more vegetables. He also plans on eating 3 meals daily instead of just 2.   Expect good compliance.  Body mass index is 37.55 kg/(m^2). Pt meets criteria for Obesity based on current BMI.  Current diet order is Carb Modified, patient is consuming approximately 75-100% of meals at this time. Labs and medications reviewed. No further nutrition interventions warranted at this time. RD contact information provided. If additional nutrition issues arise, please re-consult RD.  Ian Malkin RD, LDN Inpatient Clinical Dietitian Pager: [phone] After Hours Pager: [phone]

## 2013-11-24 NOTE — Care Management Note (Addendum)
    Page 1 of 1   11/25/2013     3:25:22 PM CARE MANAGEMENT NOTE 11/25/2013  Patient:  Mitchell [name], Mitchell [name]   Account Number:  [id number]  Date Initiated:  11/24/2013  Documentation initiated by:  [facility]  Subjective/Objective Assessment:   43 year old male with multiple medical problems including congestive heart failure and diabetes.  Admitted with SOB. // Home with spouse.     Action/Plan:   - will admit to telemetry bed  - continue Lasix IV and monitor clinical response   Anticipated DC Date:  11/26/2013   Anticipated DC Plan:  HOME/SELF CARE         Choice offered to / List presented to:             Status of service:  In process, will continue to follow Medicare Important Message given?   (If response is "NO", the following Medicare IM given date fields will be blank) Date Medicare IM given:   Date Additional Medicare IM given:    Discharge Disposition:    Per UR Regulation:  Reviewed for med. necessity/level of care/duration of stay  If discussed at Long Length of Stay Meetings, dates discussed:    Comments:  11/25/13 Oletta Cohn, RN, BSN, NCM [phone] Spoke with pt at bedside regarding needing CPAP machine replaced.  Pt states that he can not wait for delivery of new CPAP machine; as his family is waiting on him downstairs.  Pt daughter just envolved in MVA and killed suddenly; so pt and family enroute to [place].  Will replace his CPAP when he returns.

## 2013-11-24 NOTE — Progress Notes (Signed)
  Echocardiogram 2D Echocardiogram has been performed.  Real Cons Chyler Creely 11/24/2013, 9:51 AM

## 2013-11-25 DIAGNOSIS — J9601 Acute respiratory failure with hypoxia: Secondary | ICD-10-CM

## 2013-11-25 DIAGNOSIS — I10 Essential (primary) hypertension: Secondary | ICD-10-CM

## 2013-11-25 DIAGNOSIS — J9600 Acute respiratory failure, unspecified whether with hypoxia or hypercapnia: Secondary | ICD-10-CM

## 2013-11-25 LAB — GLUCOSE, CAPILLARY
Glucose-Capillary: 148 mg/dL — ABNORMAL HIGH (ref 70–99)
Glucose-Capillary: 261 mg/dL — ABNORMAL HIGH (ref 70–99)

## 2013-11-25 LAB — CBC
HCT: 37.1 % — ABNORMAL LOW (ref 39.0–52.0)
HEMOGLOBIN: 12.5 g/dL — AB (ref 13.0–17.0)
MCH: 30.2 pg (ref 26.0–34.0)
MCHC: 33.7 g/dL (ref 30.0–36.0)
MCV: 89.6 fL (ref 78.0–100.0)
PLATELETS: 159 10*3/uL (ref 150–400)
RBC: 4.14 MIL/uL — ABNORMAL LOW (ref 4.22–5.81)
RDW: 12.9 % (ref 11.5–15.5)
WBC: 8.6 10*3/uL (ref 4.0–10.5)

## 2013-11-25 LAB — BASIC METABOLIC PANEL
BUN: 12 mg/dL (ref 6–23)
CALCIUM: 8.5 mg/dL (ref 8.4–10.5)
CO2: 28 mEq/L (ref 19–32)
Chloride: 101 mEq/L (ref 96–112)
Creatinine, Ser: 0.79 mg/dL (ref 0.50–1.35)
GLUCOSE: 156 mg/dL — AB (ref 70–99)
POTASSIUM: 3.3 meq/L — AB (ref 3.7–5.3)
Sodium: 143 mEq/L (ref 137–147)

## 2013-11-25 LAB — CULTURE, BLOOD (ROUTINE X 2)

## 2013-11-25 LAB — PROCALCITONIN: Procalcitonin: <0.1 ng/mL

## 2013-11-25 MED ORDER — SERTRALINE HCL 100 MG PO TABS: 100.0000 mg | ORAL_TABLET | Freq: Two times a day (BID) | ORAL | Status: DC

## 2013-11-25 MED ORDER — POTASSIUM CHLORIDE CRYS ER 20 MEQ PO TBCR
40.0000 meq | EXTENDED_RELEASE_TABLET | Freq: Once | ORAL | Status: DC
  Filled 2013-11-25: qty 2

## 2013-11-25 MED ORDER — LOSARTAN POTASSIUM 100 MG PO TABS: 100.0000 mg | ORAL_TABLET | Freq: Every day | ORAL | Status: DC

## 2013-11-25 MED ORDER — SPIRONOLACTONE 50 MG PO TABS: 50.0000 mg | ORAL_TABLET | Freq: Every day | ORAL | Status: DC

## 2013-11-25 MED ORDER — QUETIAPINE FUMARATE 100 MG PO TABS: 100.0000 mg | ORAL_TABLET | Freq: Every day | ORAL | Status: DC

## 2013-11-25 MED ORDER — FUROSEMIDE 40 MG PO TABS: 40.0000 mg | ORAL_TABLET | Freq: Two times a day (BID) | ORAL | Status: DC

## 2013-11-25 MED ORDER — POTASSIUM CHLORIDE CRYS ER 20 MEQ PO TBCR: 40.0000 meq | EXTENDED_RELEASE_TABLET | Freq: Every day | ORAL | Status: DC

## 2013-11-25 MED ORDER — INSULIN ASPART PROT & ASPART (70-30 MIX) 100 UNIT/ML ~~LOC~~ SUSP
40.0000 [IU] | Freq: Two times a day (BID) | SUBCUTANEOUS | Status: DC
  Administered 2013-11-25: 40 [IU] via SUBCUTANEOUS
  Filled 2013-11-25: qty 10

## 2013-11-25 MED ORDER — INSULIN ASPART PROT & ASPART (70-30 MIX) 100 UNIT/ML ~~LOC~~ SUSP: 40.0000 [IU] | Freq: Two times a day (BID) | SUBCUTANEOUS | Status: DC

## 2013-11-25 MED ORDER — CARVEDILOL 25 MG PO TABS: 25.0000 mg | ORAL_TABLET | Freq: Two times a day (BID) | ORAL | Status: DC

## 2013-11-25 MED ORDER — ISOSORB DINITRATE-HYDRALAZINE 20-37.5 MG PO TABS: 1.0000 | ORAL_TABLET | Freq: Three times a day (TID) | ORAL | Status: DC

## 2013-11-25 MED ORDER — AMOXICILLIN-POT CLAVULANATE 875-125 MG PO TABS: 1.0000 | ORAL_TABLET | Freq: Two times a day (BID) | ORAL | Status: DC

## 2013-11-25 NOTE — Progress Notes (Signed)
The patient did not have any complaints of pain overnight and his VS remained stable.  He did not receive any PRN medications.  He complained of urinary retention this morning and was bladder scanned.  It revealed >150 mL, but he was able to urinated shortly thereafter.

## 2013-11-25 NOTE — Discharge Summary (Signed)
Physician Discharge Summary  RAYLAN HANTON QPR:[id number] DOB: 13-Apr-1971 DOA: 11/22/2013  PCP: Glendon Axe, MD  Admit date: 11/22/2013 Discharge date: 11/25/2013  Recommendations for Outpatient Follow-up:  - Follow up with primary care doctor in 2 weeks for repeat BMP, CBC, review of blood sugars, blood pressure, weight check. Followup final reports from blood cultures. - Dr. [name] for heart failure follow up in 2 weeks.  **Patient discharged before he developed acheived true euvolemia secondary to family emergency.  Please do not assume discharge weight is dry weight.**  Discharge Diagnoses:  Principal Problem:   Acute respiratory failure with hypoxia secondary to acute on chronic systolic heart failure and CAP Active Problems:   Type II diabetes mellitus, uncontrolled   HYPERLIPIDEMIA   HYPOKALEMIA   OBESITY   OBSTRUCTIVE SLEEP APNEA   HYPERTENSION   Acute on chronic systolic heart failure   Community acquired bacterial pneumonia   Discharge Condition: stable, improved  Diet recommendation: diabetic, healthy heart diet  Wt Readings from Last 3 Encounters:  11/25/13 123.8 kg (272 lb 14.9 oz)  07/22/13 119.387 kg (263 lb 3.2 oz)  07/21/13 127.007 kg (280 lb)    History of present illness:  Pt is 43 yo male with complex, multiple medical problems including systolic CHF (per last 2 D ECHO 10/2012) EF 25 - 30%, medical non compliance, diabetes, HTN, HLD, presented to [facility] ED with main concern of one week history of progressively worsening exertional shortness of breath, associated with subjective fevers, chills, cough productive of yellow sputum. He also noted 3 pillow orthopnea, LE swelling.   In ED, pt hypoxic upon arrival to ED with oxygen saturation in 80's, requiring oxygen. Also given Lasix and started on empiric ABX for presumptive PNA.   Hospital Course:   Acute hypoxic respiratory failure secondary to community-acquired pneumonia, acute on chronic systolic heart  failure.  For his community-acquired pneumonia, he was started on ceftriaxone and azithromycin. His Legionella and strep pneumo antigens were negative. His HIV test was nonreactive, sputum culture was not obtained.  He was given augmentin to complete a seven-day course of antibiotics.  Coag-negative staph in one out of two blood cultures from date of admission. Was likely contaminant. Prior to speciation, the patient was started on empiric vancomycin. Additional blood cultures were drawn. All of his residual blood cultures are so far no growth to date. His echocardiogram did not demonstrate any evidence of vegetation.  Acute on chronic systolic congestive heart failure, echocardiogram demonstrated a persistently moderately dilated left ventricle with severely reduced systolic function and an estimated ejection fraction of 20-25%. There was akinesis of the entire anteroseptal myocardium. He has mild mitral valve regurgitation. Left atrium is moderately dilated. The right ventricle was mildly dilated with some increased wall thickness. His echocardiogram is essentially unchanged. He was diureses with Lasix 40 mg IV 3 times a day with symptomatic improvement.  Although ins and outs are not strictly recorded, his weight trended down from 127 kg to 124 kg. He was advised eat a low-salt diet. He should weigh himself every day. If he gains more than 3 pounds in one day or 5 pounds over 7 days, he is advised to call his cardiologist. He should continue Lasix 40 mg by mouth twice a day. He is on beta blocker, digoxin, ARB, and spironolactone.  He follows in the heart failure clinic, although he has not been seen in many months. During his previous visits, that the possibility of ICD was discussed however the patient wanted  more time to think about it. He is advised to followup with cardiology within 2 weeks for reexamination and continue the conversation about ICD placement.    Diabetes mellitus type 2, noncompliant,  hemoglobin A1c 14.1. The patient states that he has difficulty affording his medications even with insurance. He was given a prescription for 70/30 insulin, 40 units twice daily.  He was also given a prescription for glucometer, and advised to followup with his primary care doctor for routine health maintenance and screening for his diabetes. He met with the nutritionist and the diabetic educator.  Hypertension, elevated blood pressures. He continued carvedilol, losartan, and was started on bidil.  His blood pressures were within normal limits by the time of discharge.  Hypokalemia due to diuretics, potassium trended up. He was given a prescription for daily potassium for the next week.  He should followup with his primary care doctor in one to 2 weeks for repeat BMP to check his potassium level and make dose adjustments if necessary.  Depression/anxiety, stable, continue Zoloft and Seroquel.  Mild normocytic anemia and thrombocytopenia.  He likely has anemia of chronic disease secondary to his heart failure. His thrombocytopenia was likely acute phase reactant secondary to his pneumonia and heart failure exacerbation.  OSA, restarted on CPAP. Ordered for DME for home CPAP machine since his previous machine stopped working.   Consultants:  None Procedures:  CXR  ECHO Antibiotics:  Ceftriaxone from 4/25 >> 4/28 Azithromycin 4/25 >>4/27  Vancomycin 4/26 >> 4/28 Augmentin 4/28 >> 4/1   Discharge Exam: Filed Vitals:   11/25/13 0605  BP: 120/72  Pulse: 82  Temp: 98.4 F (36.9 C)  Resp: 19   Filed Vitals:   11/24/13 0900 11/24/13 1536 11/24/13 2158 11/25/13 0605  BP: 140/92 138/88 130/80 120/72  Pulse: 92 87 84 82  Temp:  97.6 F (36.4 C) 98.4 F (36.9 C) 98.4 F (36.9 C)  TempSrc:  Oral Oral Oral  Resp:  _0 Height:      Weight:    123.8 kg (272 lb 14.9 oz)  SpO2: 97% 97% 100% 98%    General: Obese African American male, No acute distress  HEENT: NCAT, MMM   Cardiovascular: RRR, nl S1, S2 no mrg, 2+ pulses, warm extremities  Respiratory: CTAB, no increased WOB  Abdomen: NABS, soft, mildly distended, nontender  MSK: Normal tone and bulk, trace bilateral LEE  Neuro: Grossly intact   Discharge Instructions      Discharge Orders   Future Orders Complete By Expires   (HEART FAILURE PATIENTS) Call MD:  Anytime you have any of the following symptoms: 1) 3 pound weight gain in 24 hours or 5 pounds in 1 week 2) shortness of breath, with or without a dry hacking cough 3) swelling in the hands, feet or stomach 4) if you have to sleep on extra pillows at night in order to breathe.  As directed    Call MD for:  difficulty breathing, headache or visual disturbances  As directed    Call MD for:  extreme fatigue  As directed    Call MD for:  hives  As directed    Call MD for:  persistant dizziness or light-headedness  As directed    Call MD for:  persistant nausea and vomiting  As directed    Call MD for:  severe uncontrolled pain  As directed    Call MD for:  temperature >100.4  As directed    Diet - low sodium  heart healthy  As directed    Diet Carb Modified  As directed    Discharge instructions  As directed    For home use only DME continuous positive airway pressure (CPAP)  As directed    Questions:     Patient has OSA or probable OSA:  Yes   Is the patient currently using CPAP in the home:  Yes   If yes (to question two):  Determine DME provider and inform them of any new orders/settings   Signs and symptoms of probable OSA  (select all that apply):     If no (to question two) date of sleep study:     Date of face to face encounter:  11/25/13   Settings:  Other see comments   Increase activity slowly  As directed        Medication List    STOP taking these medications       metFORMIN 1000 MG tablet  Commonly known as:  GLUCOPHAGE     QROXIN 0.0375-5 % Ptch  Generic drug:  Capsaicin-Menthol      TAKE these medications        amoxicillin-clavulanate 875-125 MG per tablet  Commonly known as:  AUGMENTIN  Take 1 tablet by mouth 2 (two) times daily.     carvedilol 25 MG tablet  Commonly known as:  COREG  Take 1 tablet (25 mg total) by mouth 2 (two) times daily with a meal.     digoxin 0.25 MG tablet  Commonly known as:  LANOXIN  Take 1 tablet (0.25 mg total) by mouth daily.     furosemide 40 MG tablet  Commonly known as:  LASIX  Take 1 tablet (40 mg total) by mouth 2 (two) times daily.     insulin aspart protamine- aspart (70-30) 100 UNIT/ML injection  Commonly known as:  NOVOLOG MIX 70/30  Inject 0.4 mLs (40 Units total) into the skin 2 (two) times daily with a meal.     isosorbide-hydrALAZINE 20-37.5 MG per tablet  Commonly known as:  BIDIL  Take 1 tablet by mouth 3 (three) times daily.     losartan 100 MG tablet  Commonly known as:  COZAAR  Take 1 tablet (100 mg total) by mouth daily.     magnesium oxide 400 (241.3 MG) MG tablet  Commonly known as:  MAG-OX  Take 1 tablet (400 mg total) by mouth daily.     potassium chloride SA 20 MEQ tablet  Commonly known as:  K-DUR,KLOR-CON  Take 2 tablets (40 mEq total) by mouth daily.     QUEtiapine 100 MG tablet  Commonly known as:  SEROQUEL  Take 1 tablet (100 mg total) by mouth at bedtime.     sertraline 100 MG tablet  Commonly known as:  ZOLOFT  Take 1 tablet (100 mg total) by mouth 2 (two) times daily.     spironolactone 50 MG tablet  Commonly known as:  ALDACTONE  Take 1 tablet (50 mg total) by mouth daily.       Follow-up Information   Follow up with Glendon Axe, MD. Schedule an appointment as soon as possible for a visit on 12/02/2013. (_0 :45 am spoke with Dianna)    Specialty:  Family Medicine   Contact information:   [place] [address] [phone]       Follow up with Glori Bickers, MD. Schedule an appointment as soon as possible for a visit in 2 weeks.   Specialty:  Cardiology  Contact information:   [address] [place] [address] [phone]        The results of significant diagnostics from this hospitalization (including imaging, microbiology, ancillary and laboratory) are listed below for reference.    Significant Diagnostic Studies: Dg Chest 2 View  11/22/2013   CLINICAL DATA:  Shortness of breath, history CHF, hypertension, stroke, MI, diabetes  EXAM: CHEST  2 VIEW  COMPARISON:  07/22/2013  FINDINGS: Enlargement of cardiac silhouette with pulmonary vascular congestion.  Mediastinal contour stable.  Improved right middle lobe infiltrate versus previous exam.  Mild right lower lobe infiltrate is present.  Upper lungs clear.  No pleural effusion or pneumothorax.  IMPRESSION: Improved right middle lobe infiltrates since previous study with mild right lower lobe infiltrate now identified.  Enlargement of cardiac silhouette with pulmonary vascular congestion.   Electronically Signed   By: Lavonia Dana M.D.   On: 11/22/2013 02:23    Microbiology: Recent Results (from the past 240 hour(s))  CULTURE, BLOOD (ROUTINE X 2)     Status: None   Collection Time    11/22/13  9:57 AM      Result Value Ref Range Status   Specimen Description BLOOD LEFT HAND   Final   Special Requests BOTTLES DRAWN AEROBIC AND ANAEROBIC 10CC   Final   Culture  Setup Time     Final   Value: 11/22/2013 13:51     Performed at [facility]   Culture     Final   Value:        BLOOD CULTURE RECEIVED NO GROWTH TO DATE CULTURE WILL BE HELD FOR 5 DAYS BEFORE ISSUING A FINAL NEGATIVE REPORT     Performed at [facility]   Report Status PENDING   Incomplete  CULTURE, BLOOD (ROUTINE X 2)     Status: None   Collection Time    11/22/13 10:05 AM      Result Value Ref Range Status   Specimen Description BLOOD RIGHT ARM   Final   Special Requests BOTTLES DRAWN AEROBIC AND ANAEROBIC 5CC   Final   Culture  Setup Time     Final   Value: 11/22/2013 13:51     Performed at [facility]    Culture     Final   Value: STAPHYLOCOCCUS SPECIES (COAGULASE NEGATIVE)     Note: THE SIGNIFICANCE OF ISOLATING THIS ORGANISM FROM A SINGLE SET OF BLOOD CULTURES WHEN MULTIPLE SETS ARE DRAWN IS UNCERTAIN. PLEASE NOTIFY THE MICROBIOLOGY DEPARTMENT WITHIN ONE WEEK IF SPECIATION AND SENSITIVITIES ARE REQUIRED.     Note: Gram Stain Report Called to,Read Back By and Verified With: Nicholes Stairs 11/23/13 @ 12:46PM BY RUSCOE A.     Performed at [facility]   Report Status 11/25/2013 FINAL   Final  CULTURE, BLOOD (ROUTINE X 2)     Status: None   Collection Time    11/23/13  2:50 PM      Result Value Ref Range Status   Specimen Description BLOOD RIGHT ARM   Final   Special Requests BOTTLES DRAWN AEROBIC ONLY 10CC   Final   Culture  Setup Time     Final   Value: 11/23/2013 20:03     Performed at [facility]   Culture     Final   Value:        BLOOD CULTURE RECEIVED NO GROWTH TO DATE CULTURE WILL BE HELD FOR 5 DAYS BEFORE ISSUING A FINAL  NEGATIVE REPORT     Performed at [facility]   Report Status PENDING   Incomplete  CULTURE, BLOOD (ROUTINE X 2)     Status: None   Collection Time    11/23/13  2:55 PM      Result Value Ref Range Status   Specimen Description BLOOD LEFT HAND   Final   Special Requests BOTTLES DRAWN AEROBIC ONLY 10CC   Final   Culture  Setup Time     Final   Value: 11/23/2013 20:03     Performed at [facility]   Culture     Final   Value:        BLOOD CULTURE RECEIVED NO GROWTH TO DATE CULTURE WILL BE HELD FOR 5 DAYS BEFORE ISSUING A FINAL NEGATIVE REPORT     Performed at [facility]   Report Status PENDING   Incomplete  CULTURE, BLOOD (SINGLE)     Status: None   Collection Time    11/24/13  8:54 AM      Result Value Ref Range Status   Specimen Description BLOOD RIGHT ANTECUBITAL   Final   Special Requests BOTTLES DRAWN AEROBIC ONLY 6CC   Final   Culture  Setup Time     Final   Value: 11/24/2013 12:30     Performed at [facility]   Culture     Final   Value:        BLOOD CULTURE RECEIVED NO GROWTH TO DATE CULTURE WILL BE HELD FOR 5 DAYS BEFORE ISSUING A FINAL NEGATIVE REPORT     Performed at [facility]   Report Status PENDING   Incomplete     Labs: Basic Metabolic Panel:  Recent Labs Lab 11/22/13 0126 11/22/13 1005 11/22/13 1400 11/23/13 0420 11/24/13 0854 11/25/13 0545  NA 140 141  --  145 140 143  K 3.6* 3.4*  --  3.1* 3.3* 3.3*  CL 99 102  --  102 101 101  CO2 26 22  --  _0 GLUCOSE 411* 374* 361* 278* 242* 156*  BUN 11 11  --  _1 CREATININE 0.80 0.71  --  0.75 0.69 0.79  CALCIUM 9.4 8.6  --  8.6 8.4 8.5  MG  --  1.5  --   --   --   --   PHOS  --  4.0  --   --   --   --    Liver Function Tests:  Recent Labs Lab 11/22/13 1005  AST 24  ALT 28  ALKPHOS 93  BILITOT 0.6  PROT 6.5  ALBUMIN 3.1*   No results found for this basename: LIPASE, AMYLASE,  in the last 168 hours No results found for this basename: AMMONIA,  in the last 168 hours CBC:  Recent Labs Lab 11/22/13 0126 11/23/13 0420 11/24/13 0854 11/25/13 0545  WBC 9.6 8.7 8.4 8.6  NEUTROABS 7.3  --   --   --   HGB 12.5* 12.5* 12.5* 12.5*  HCT 36.2* 36.3* 37.5* 37.1*  MCV 88.7 88.5 89.3 89.6  PLT 153 145* 148* 159   Cardiac Enzymes:  Recent Labs Lab 11/22/13 0126  TROPONINI <0.30   BNP: BNP (last 3 results)  Recent Labs  11/22/13 0126 11/22/13 1005 11/23/13 0420  PROBNP 597.7* 533.6* 233.2*   CBG:  Recent Labs Lab 11/24/13 1113 11/24/13 1600 11/24/13 2155 11/25/13 0610 11/25/13 1045  GLUCAP 282* 211* 312* 148* 261*  Time coordinating discharge: 45 minutes  Signed:  Janece Canterbury  [facility] 11/25/2013, 11:31 AM

## 2013-11-28 LAB — CULTURE, BLOOD (ROUTINE X 2): Culture: NO GROWTH

## 2013-11-29 LAB — CULTURE, BLOOD (ROUTINE X 2)
CULTURE: NO GROWTH
Culture: NO GROWTH

## 2013-12-01 LAB — CULTURE, BLOOD (SINGLE): Culture: NO GROWTH

## 2013-12-02 ENCOUNTER — Encounter (HOSPITAL_COMMUNITY): Payer: Self-pay

## 2013-12-02 ENCOUNTER — Inpatient Hospital Stay (HOSPITAL_COMMUNITY): Admission: RE | Admit: 2013-12-02 | Payer: No Typology Code available for payment source | Source: Ambulatory Visit

## 2014-01-07 ENCOUNTER — Observation Stay (HOSPITAL_COMMUNITY)
Admission: EM | Admit: 2014-01-07 | Discharge: 2014-01-09 | Disposition: A | Discharge status: Home / Self Care | Payer: No Typology Code available for payment source | Attending: Internal Medicine | Admitting: Internal Medicine

## 2014-01-07 ENCOUNTER — Encounter (HOSPITAL_COMMUNITY): Payer: Self-pay | Admitting: Emergency Medicine

## 2014-01-07 ENCOUNTER — Emergency Department (HOSPITAL_COMMUNITY): Payer: No Typology Code available for payment source

## 2014-01-07 DIAGNOSIS — Z8619 Personal history of other infectious and parasitic diseases: Secondary | ICD-10-CM | POA: Insufficient documentation

## 2014-01-07 DIAGNOSIS — F3289 Other specified depressive episodes: Secondary | ICD-10-CM | POA: Insufficient documentation

## 2014-01-07 DIAGNOSIS — I509 Heart failure, unspecified: Secondary | ICD-10-CM | POA: Insufficient documentation

## 2014-01-07 DIAGNOSIS — I4949 Other premature depolarization: Secondary | ICD-10-CM

## 2014-01-07 DIAGNOSIS — R55 Syncope and collapse: Principal | ICD-10-CM | POA: Diagnosis present

## 2014-01-07 DIAGNOSIS — E785 Hyperlipidemia, unspecified: Secondary | ICD-10-CM | POA: Diagnosis present

## 2014-01-07 DIAGNOSIS — F329 Major depressive disorder, single episode, unspecified: Secondary | ICD-10-CM | POA: Insufficient documentation

## 2014-01-07 DIAGNOSIS — E876 Hypokalemia: Secondary | ICD-10-CM

## 2014-01-07 DIAGNOSIS — E669 Obesity, unspecified: Secondary | ICD-10-CM | POA: Insufficient documentation

## 2014-01-07 DIAGNOSIS — Z043 Encounter for examination and observation following other accident: Secondary | ICD-10-CM | POA: Insufficient documentation

## 2014-01-07 DIAGNOSIS — I5023 Acute on chronic systolic (congestive) heart failure: Secondary | ICD-10-CM

## 2014-01-07 DIAGNOSIS — E66813 Obesity, class 3: Secondary | ICD-10-CM | POA: Diagnosis present

## 2014-01-07 DIAGNOSIS — J159 Unspecified bacterial pneumonia: Secondary | ICD-10-CM

## 2014-01-07 DIAGNOSIS — J9601 Acute respiratory failure with hypoxia: Secondary | ICD-10-CM

## 2014-01-07 DIAGNOSIS — IMO0001 Reserved for inherently not codable concepts without codable children: Secondary | ICD-10-CM

## 2014-01-07 DIAGNOSIS — Z87828 Personal history of other (healed) physical injury and trauma: Secondary | ICD-10-CM | POA: Insufficient documentation

## 2014-01-07 DIAGNOSIS — I5022 Chronic systolic (congestive) heart failure: Secondary | ICD-10-CM | POA: Diagnosis present

## 2014-01-07 DIAGNOSIS — Z79899 Other long term (current) drug therapy: Secondary | ICD-10-CM | POA: Insufficient documentation

## 2014-01-07 DIAGNOSIS — G4733 Obstructive sleep apnea (adult) (pediatric): Secondary | ICD-10-CM | POA: Diagnosis present

## 2014-01-07 DIAGNOSIS — I252 Old myocardial infarction: Secondary | ICD-10-CM | POA: Insufficient documentation

## 2014-01-07 DIAGNOSIS — K089 Disorder of teeth and supporting structures, unspecified: Secondary | ICD-10-CM | POA: Insufficient documentation

## 2014-01-07 DIAGNOSIS — I10 Essential (primary) hypertension: Secondary | ICD-10-CM | POA: Diagnosis present

## 2014-01-07 DIAGNOSIS — Z888 Allergy status to other drugs, medicaments and biological substances status: Secondary | ICD-10-CM | POA: Insufficient documentation

## 2014-01-07 DIAGNOSIS — K029 Dental caries, unspecified: Secondary | ICD-10-CM | POA: Diagnosis present

## 2014-01-07 DIAGNOSIS — Z8673 Personal history of transient ischemic attack (TIA), and cerebral infarction without residual deficits: Secondary | ICD-10-CM | POA: Insufficient documentation

## 2014-01-07 DIAGNOSIS — R739 Hyperglycemia, unspecified: Secondary | ICD-10-CM

## 2014-01-07 DIAGNOSIS — Y9389 Activity, other specified: Secondary | ICD-10-CM | POA: Insufficient documentation

## 2014-01-07 DIAGNOSIS — J189 Pneumonia, unspecified organism: Secondary | ICD-10-CM

## 2014-01-07 DIAGNOSIS — R296 Repeated falls: Secondary | ICD-10-CM | POA: Insufficient documentation

## 2014-01-07 DIAGNOSIS — Z860100 Personal history of colon polyps, unspecified: Secondary | ICD-10-CM | POA: Insufficient documentation

## 2014-01-07 DIAGNOSIS — N529 Male erectile dysfunction, unspecified: Secondary | ICD-10-CM

## 2014-01-07 DIAGNOSIS — Z9981 Dependence on supplemental oxygen: Secondary | ICD-10-CM | POA: Insufficient documentation

## 2014-01-07 DIAGNOSIS — Y929 Unspecified place or not applicable: Secondary | ICD-10-CM | POA: Insufficient documentation

## 2014-01-07 DIAGNOSIS — Z9889 Other specified postprocedural states: Secondary | ICD-10-CM | POA: Insufficient documentation

## 2014-01-07 DIAGNOSIS — E1165 Type 2 diabetes mellitus with hyperglycemia: Secondary | ICD-10-CM

## 2014-01-07 DIAGNOSIS — E1110 Type 2 diabetes mellitus with ketoacidosis without coma: Secondary | ICD-10-CM | POA: Diagnosis present

## 2014-01-07 DIAGNOSIS — E119 Type 2 diabetes mellitus without complications: Secondary | ICD-10-CM | POA: Insufficient documentation

## 2014-01-07 LAB — CBC
HCT: 38.1 % — ABNORMAL LOW (ref 39.0–52.0)
HCT: 36.9 % — ABNORMAL LOW (ref 39.0–52.0)
HEMOGLOBIN: 12.8 g/dL — AB (ref 13.0–17.0)
Hemoglobin: 12.8 g/dL — ABNORMAL LOW (ref 13.0–17.0)
MCH: 29.2 pg (ref 26.0–34.0)
MCH: 29.8 pg (ref 26.0–34.0)
MCHC: 33.6 g/dL (ref 30.0–36.0)
MCHC: 34.7 g/dL (ref 30.0–36.0)
MCV: 87 fL (ref 78.0–100.0)
MCV: 86 fL (ref 78.0–100.0)
PLATELETS: 169 10*3/uL (ref 150–400)
Platelets: 173 10*3/uL (ref 150–400)
RBC: 4.38 MIL/uL (ref 4.22–5.81)
RBC: 4.29 MIL/uL (ref 4.22–5.81)
RDW: 12.3 % (ref 11.5–15.5)
RDW: 12.3 % (ref 11.5–15.5)
WBC: 7.5 10*3/uL (ref 4.0–10.5)
WBC: 7.9 10*3/uL (ref 4.0–10.5)

## 2014-01-07 LAB — URINALYSIS, ROUTINE W REFLEX MICROSCOPIC
Bilirubin Urine: NEGATIVE
Glucose, UA: >1000 mg/dL — AB
Hgb urine dipstick: NEGATIVE
Ketones, ur: NEGATIVE mg/dL
LEUKOCYTES UA: NEGATIVE
NITRITE: NEGATIVE
PROTEIN: NEGATIVE mg/dL
SPECIFIC GRAVITY, URINE: 1.021 (ref 1.005–1.030)
UROBILINOGEN UA: 0.2 mg/dL (ref 0.0–1.0)
pH: 5 (ref 5.0–8.0)

## 2014-01-07 LAB — CBG MONITORING, ED
GLUCOSE-CAPILLARY: 536 mg/dL — AB (ref 70–99)
GLUCOSE-CAPILLARY: 369 mg/dL — AB (ref 70–99)
Glucose-Capillary: 302 mg/dL — ABNORMAL HIGH (ref 70–99)

## 2014-01-07 LAB — RAPID URINE DRUG SCREEN, HOSP PERFORMED
Amphetamines: NOT DETECTED
Barbiturates: NOT DETECTED
Benzodiazepines: NOT DETECTED
Cocaine: NOT DETECTED
OPIATES: NOT DETECTED
Tetrahydrocannabinol: NOT DETECTED

## 2014-01-07 LAB — CREATININE, SERUM
CREATININE: 0.73 mg/dL (ref 0.50–1.35)
GFR calc Af Amer: >90 mL/min (ref >90)
GFR calc non Af Amer: >90 mL/min (ref >90)

## 2014-01-07 LAB — BASIC METABOLIC PANEL
BUN: 13 mg/dL (ref 6–23)
CO2: 23 mEq/L (ref 19–32)
Calcium: 9 mg/dL (ref 8.4–10.5)
Chloride: 91 mEq/L — ABNORMAL LOW (ref 96–112)
Creatinine, Ser: 0.88 mg/dL (ref 0.50–1.35)
Glucose, Bld: 548 mg/dL — ABNORMAL HIGH (ref 70–99)
POTASSIUM: 4.1 meq/L (ref 3.7–5.3)
SODIUM: 135 meq/L — AB (ref 137–147)

## 2014-01-07 LAB — HEPATIC FUNCTION PANEL
ALBUMIN: 3.7 g/dL (ref 3.5–5.2)
ALT: 28 U/L (ref 0–53)
AST: 27 U/L (ref 0–37)
Alkaline Phosphatase: 116 U/L (ref 39–117)
Bilirubin, Direct: <0.2 mg/dL (ref 0.0–0.3)
TOTAL PROTEIN: 7.8 g/dL (ref 6.0–8.3)
Total Bilirubin: <0.2 mg/dL — ABNORMAL LOW (ref 0.3–1.2)

## 2014-01-07 LAB — LIPASE, BLOOD: Lipase: 50 U/L (ref 11–59)

## 2014-01-07 LAB — TSH: TSH: 1.24 u[IU]/mL (ref 0.350–4.500)

## 2014-01-07 LAB — DIGOXIN LEVEL: Digoxin Level: <0.3 ng/mL — ABNORMAL LOW (ref 0.8–2.0)

## 2014-01-07 LAB — PRO B NATRIURETIC PEPTIDE: Pro B Natriuretic peptide (BNP): 235.1 pg/mL — ABNORMAL HIGH (ref 0–125)

## 2014-01-07 LAB — TROPONIN I

## 2014-01-07 LAB — ETHANOL

## 2014-01-07 LAB — GLUCOSE, CAPILLARY: Glucose-Capillary: 330 mg/dL — ABNORMAL HIGH (ref 70–99)

## 2014-01-07 LAB — URINE MICROSCOPIC-ADD ON

## 2014-01-07 MED ORDER — PANTOPRAZOLE SODIUM 40 MG IV SOLR
40.0000 mg | Freq: Two times a day (BID) | INTRAVENOUS | Status: DC
  Administered 2014-01-07: 40 mg via INTRAVENOUS
  Filled 2014-01-07 (×3): qty 40

## 2014-01-07 MED ORDER — ISOSORB DINITRATE-HYDRALAZINE 20-37.5 MG PO TABS
1.0000 | ORAL_TABLET | Freq: Three times a day (TID) | ORAL | Status: DC
  Administered 2014-01-07: 1 via ORAL
  Filled 2014-01-07 (×4): qty 1

## 2014-01-07 MED ORDER — MAGNESIUM OXIDE 400 (241.3 MG) MG PO TABS
400.0000 mg | ORAL_TABLET | Freq: Every day | ORAL | Status: DC
  Administered 2014-01-08 – 2014-01-09 (×2): 400 mg via ORAL
  Filled 2014-01-07 (×2): qty 1

## 2014-01-07 MED ORDER — MORPHINE SULFATE 4 MG/ML IJ SOLN
4.0000 mg | Freq: Once | INTRAMUSCULAR | Status: AC
  Administered 2014-01-07: 4 mg via INTRAVENOUS
  Filled 2014-01-07: qty 1

## 2014-01-07 MED ORDER — MORPHINE SULFATE 2 MG/ML IJ SOLN
1.0000 mg | INTRAMUSCULAR | Status: DC | PRN
  Administered 2014-01-07: 2 mg via INTRAVENOUS
  Filled 2014-01-07: qty 1

## 2014-01-07 MED ORDER — QUETIAPINE FUMARATE 100 MG PO TABS
100.0000 mg | ORAL_TABLET | Freq: Every day | ORAL | Status: DC
  Administered 2014-01-07 – 2014-01-08 (×2): 100 mg via ORAL
  Filled 2014-01-07 (×3): qty 1

## 2014-01-07 MED ORDER — DIGOXIN 250 MCG PO TABS
0.2500 mg | ORAL_TABLET | Freq: Every day | ORAL | Status: DC
  Administered 2014-01-07 – 2014-01-09 (×3): 0.25 mg via ORAL
  Filled 2014-01-07 (×3): qty 1

## 2014-01-07 MED ORDER — SODIUM CHLORIDE 0.9 % IJ SOLN
3.0000 mL | Freq: Two times a day (BID) | INTRAMUSCULAR | Status: DC
  Administered 2014-01-07 – 2014-01-08 (×3): 3 mL via INTRAVENOUS

## 2014-01-07 MED ORDER — INSULIN ASPART 100 UNIT/ML ~~LOC~~ SOLN
0.0000 [IU] | SUBCUTANEOUS | Status: DC
  Administered 2014-01-07: 12 [IU] via SUBCUTANEOUS
  Administered 2014-01-08 (×3): 8 [IU] via SUBCUTANEOUS
  Filled 2014-01-07: qty 1

## 2014-01-07 MED ORDER — ONDANSETRON HCL 4 MG/2ML IJ SOLN
4.0000 mg | Freq: Once | INTRAMUSCULAR | Status: AC
  Administered 2014-01-07: 4 mg via INTRAVENOUS
  Filled 2014-01-07: qty 2

## 2014-01-07 MED ORDER — INSULIN ASPART 100 UNIT/ML ~~LOC~~ SOLN
10.0000 [IU] | Freq: Once | SUBCUTANEOUS | Status: AC
Start: 2014-01-07 — End: 2014-01-07
  Administered 2014-01-07: 10 [IU] via SUBCUTANEOUS
  Filled 2014-01-07: qty 1

## 2014-01-07 MED ORDER — SERTRALINE HCL 100 MG PO TABS
100.0000 mg | ORAL_TABLET | Freq: Two times a day (BID) | ORAL | Status: DC
  Administered 2014-01-07 – 2014-01-09 (×4): 100 mg via ORAL
  Filled 2014-01-07 (×5): qty 1

## 2014-01-07 MED ORDER — CARVEDILOL 25 MG PO TABS
25.0000 mg | ORAL_TABLET | Freq: Two times a day (BID) | ORAL | Status: DC
  Filled 2014-01-07 (×3): qty 1

## 2014-01-07 MED ORDER — LOSARTAN POTASSIUM 50 MG PO TABS
100.0000 mg | ORAL_TABLET | Freq: Every day | ORAL | Status: DC
  Filled 2014-01-07: qty 2

## 2014-01-07 MED ORDER — SPIRONOLACTONE 50 MG PO TABS
50.0000 mg | ORAL_TABLET | Freq: Every day | ORAL | Status: DC
  Filled 2014-01-07: qty 1

## 2014-01-07 MED ORDER — HEPARIN SODIUM (PORCINE) 5000 UNIT/ML IJ SOLN
5000.0000 [IU] | Freq: Three times a day (TID) | INTRAMUSCULAR | Status: DC
  Administered 2014-01-07 – 2014-01-09 (×5): 5000 [IU] via SUBCUTANEOUS
  Filled 2014-01-07 (×8): qty 1

## 2014-01-07 MED ORDER — SODIUM CHLORIDE 0.9 % IV BOLUS (SEPSIS)
500.0000 mL | Freq: Once | INTRAVENOUS | Status: AC
  Administered 2014-01-07: 500 mL via INTRAVENOUS

## 2014-01-07 NOTE — ED Notes (Signed)
Patient cannot remember if he passed out, he was on the side of a curb in the parking lot and was "spaced out".  EMS said he was not able to answer questions.  According to EMS, bystanders saw him sitting on the curb called 911.  Upon arrival, EMS said he was not able to answer any question but he did have his eyes open.  The put him on 2L [place] and started some fluids. After about 5 to 6 minutes he was able to answer questions appropriately and was a GCS of 15.  Denied any head injury, or back injury.  He did complain of chest and abdominal for an unknown amount of time.  He did take Nsaids which he is allergic to because of some "teeth Pain".  EMS placed an 18 gauge and started fluids on him.  Patient denies N/V, diarrhea, SOB, or any other symptoms other than pain in his chest and abdomen.  The patient was transported here to be evaluated.

## 2014-01-07 NOTE — ED Notes (Signed)
Denies abdominal

## 2014-01-07 NOTE — ED Notes (Signed)
Took pts CBG it was 369 reported to nurse.

## 2014-01-07 NOTE — ED Notes (Signed)
Notified RN of CBG 536

## 2014-01-07 NOTE — ED Provider Notes (Signed)
CSN: [id number]     Arrival date & time 01/07/14  1417 History   First MD Initiated Contact with Patient 01/07/14 1502     Chief Complaint  Patient presents with  . Near Syncope    Patient cannot remember if her passed out, he was on the side of a curb in the parking lot and was "spaced out".  EMS said he was not able to answer questions.       (Consider location/radiation/quality/duration/timing/severity/associated sxs/prior Treatment) HPI Pt presenting with c/o near syncope.  He states he has been having a lot of dental pain, took some goody's powders- which he has been taking this week.  He was outside in the heat and getting out of his car, when he fell to the ground.  He is unsure if he fainted or not.  Per bystanders he had LOC.  No seizure activity reported.  No incontinence or tongue biting.  Pt denies chest pain at the time of the fall. He states he intermittently has abdominal and chest pain.  No fever/chills.  Had been eating and drinking normally.  No sob.  No leg swelling.  He does not know if his sugar has been elevated because he does not check.  No vomiting or diarrhea.  Currently his main complaint is about his tooth pain.  There are no other associated systemic symptoms, there are no other alleviating or modifying factors.   Past Medical History  Diagnosis Date  . Hypertension   . Hyperlipidemia   . Depression   . OSA (obstructive sleep apnea)     On CPAP; NON-COMPLIANT  . CVA (cerebral infarction)     No residual deficits  . Non-ischemic cardiomyopathy     No ischemia on myoview, showed EF of 42%. 2D echo showed EF 50-55% with diatolic dysfunction in 2009  . Post-cardiac injury syndrome     History of cardiac injury from blunt trauma  . PVCs (premature ventricular contractions)   . History of colonic polyps   . Obesity   . Syncope     Recurrent, thought to be vasovagal. Also has h/o frequent PVCs.   . Chronic systolic heart failure 11/2010    a. NICM 4/14 EF 20-25%,  TR mild  . SOB (shortness of breath)   . Stroke   . 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 Myocardial infarction   . Headache(784.0)   . Diabetes mellitus     TYPE II; UNCONTROLLED BY HEMOGLOBIN A1c; STABLE AS  PER DISCHARGE  . CHF (congestive heart failure)    Past Surgical History  Procedure Laterality Date  . Cardiac catheterization  12/19/10    DIFFUSE NONOBSTRUCTIVE CAD; NONISCHEMIC CARDIOMYOPATHY; LEFT VENTRICULAR ANGIOGRAM WAS PERFORMED SECONDARY TO  ELEVATED LEFT VENTRICULAR FILLING PRESSURES  . Right thumb     Family History  Problem Relation Age of Onset  . Heart disease Mother     MI  . Heart failure Mother   . Diabetes Mother     ALSO IN MOST OF HIS SIBLINGS; 2 UNLCES HAVE ALSO PASSED AWAY FROM DM  . Cardiomyopathy Mother    History  Substance Use Topics  . Smoking status: Never Smoker   . Smokeless tobacco: Not on file  . Alcohol Use: No    Review of Systems ROS reviewed and all otherwise negative except for mentioned in HPI    Allergies  Nsaids  Home Medications   Prior to Admission medications   Medication Sig Start Date End Date Taking?  Authorizing Provider  carvedilol (COREG) 25 MG tablet Take 1 tablet (25 mg total) by mouth 2 (two) times daily with a meal. 11/25/13  Yes Renae Fickle, MD  furosemide (LASIX) 40 MG tablet Take 1 tablet (40 mg total) by mouth 2 (two) times daily. 11/25/13  Yes Renae Fickle, MD  isosorbide-hydrALAZINE (BIDIL) 20-37.5 MG per tablet Take 1 tablet by mouth 3 (three) times daily. 11/25/13  Yes Renae Fickle, MD  losartan (COZAAR) 100 MG tablet Take 1 tablet (100 mg total) by mouth daily. 11/25/13  Yes Renae Fickle, MD  magnesium oxide (MAG-OX) 400 (241.3 MG) MG tablet Take 1 tablet (400 mg total) by mouth daily. 07/25/13  Yes Clydia Llano, MD  QUEtiapine (SEROQUEL) 100 MG tablet Take 1 tablet (100 mg total) by mouth at bedtime. 11/25/13  Yes Renae Fickle, MD  sertraline (ZOLOFT) 100 MG tablet Take 1 tablet (100 mg  total) by mouth 2 (two) times daily. 11/25/13  Yes Renae Fickle, MD  spironolactone (ALDACTONE) 50 MG tablet Take 1 tablet (50 mg total) by mouth daily. 11/25/13 11/25/14 Yes Renae Fickle, MD  digoxin (LANOXIN) 0.25 MG tablet Take 1 tablet (0.25 mg total) by mouth daily. 10/30/12 12/05/13  Amy D Clegg, NP   BP 170/116  Pulse 91  Temp(Src) 99.1 F (37.3 C) (Oral)  Resp 16  SpO2 97% Vitals reviewed Physical Exam Physical Examination: General appearance - alert, well appearing, and in no distress Mental status - alert, oriented to person, place, and time Eyes - pupils equal and reactive, extraocular eye movements intact Mouth - mucous membranes moist, pharynx normal without lesions, poor dentition Chest - clear to auscultation, no wheezes, rales or rhonchi, symmetric air entry Heart - normal rate, regular rhythm, normal S1, S2, no murmurs, rubs, clicks or gallops Abdomen - soft, nontender, nondistended, no masses or organomegaly, nabs Neurology- awake, alert and oriented x 3, cranial nerves 2-12 grossly intact strength 5/5 in extremities x 4, sensation intact Extremities - peripheral pulses normal, no pedal edema, no clubbing or cyanosis Skin - normal coloration and turgor, no rashes  ED Course  Procedures (including critical care time)  6:47 PM d/w hospitalist, he will see patient soon in the ED and states he will put the bed request and orders in.  Labs Review Labs Reviewed  CBC - Abnormal; Notable for the following:    Hemoglobin 12.8 (*)    HCT 38.1 (*)    All other components within normal limits  BASIC METABOLIC PANEL - Abnormal; Notable for the following:    Sodium 135 (*)    Chloride 91 (*)    Glucose, Bld 548 (*)    All other components within normal limits  URINALYSIS, ROUTINE W REFLEX MICROSCOPIC - Abnormal; Notable for the following:    Color, Urine COLORLESS (*)    Glucose, UA >1000 (*)    All other components within normal limits  DIGOXIN LEVEL - Abnormal; Notable  for the following:    Digoxin Level <0.3 (*)    All other components within normal limits  HEPATIC FUNCTION PANEL - Abnormal; Notable for the following:    Total Bilirubin <0.2 (*)    All other components within normal limits  CBG MONITORING, ED - Abnormal; Notable for the following:    Glucose-Capillary 536 (*)    All other components within normal limits  CBG MONITORING, ED - Abnormal; Notable for the following:    Glucose-Capillary 369 (*)    All other components within normal limits  TROPONIN I  ETHANOL  URINE RAPID DRUG SCREEN (HOSP PERFORMED)  URINE MICROSCOPIC-ADD ON  LIPASE, BLOOD  CBC  CREATININE, SERUM  COMPREHENSIVE METABOLIC PANEL  CBC WITH DIFFERENTIAL  TSH  TROPONIN I  TROPONIN I  TROPONIN I  PRO B NATRIURETIC PEPTIDE    Imaging Review Dg Chest 2 View  01/07/2014   CLINICAL DATA:  Near syncope; history of diabetes and congestive heart failure  EXAM: CHEST  2 VIEW  COMPARISON:  PA and lateral chest of November 22, 2013  FINDINGS: The lungs are adequately inflated and clear. The heart and mediastinal structures are normal. The bony structures are unremarkable.  IMPRESSION: There is no acute cardiopulmonary disease.   Electronically Signed   By: David  [place]   On: 01/07/2014 15:55     EKG Interpretation   Date/Time:  Wednesday January 07 2014 14:37:13 EDT Ventricular Rate:  93 PR Interval:  169 QRS Duration: 105 QT Interval:  392 QTC Calculation: 488 R Axis:   -42 Text Interpretation:  Sinus rhythm Left anterior fascicular block  Borderline prolonged QT interval No significant change was found Confirmed  by CAMPOS  MD, Caryn Bee ([id number]) on 01/07/2014 2:45:59 PM      MDM   Final diagnoses:  Syncope  Hyperglycemia    Pt with multiple medical problems presenting with c/o syncopal event earlier today.  Pt has hx of CHF putting him in a higher risk category for adverse events.  Pt also found to be hyperglycemic.  CBG down to 300s after IV fluids and subq insulin.   D/w [facility] for admission.      Ethelda Chick, MD 01/07/14 [phone]

## 2014-01-07 NOTE — ED Notes (Signed)
Patient said he was having pain and SOB.  He said he has been having dental pain and took goody powders for the pain.  He is allergic to NSAIDS and thinks that is where the abdominal pain is coming from.  He says that when he got to his wife's job he "passed out" because he woke up face first.  He does not have any injuries to his head, neck, face or back.  He says his chest pain is resolved but his abdominal pain is still there.

## 2014-01-07 NOTE — H&P (Addendum)
Hospitalist Admission History and Physical  Patient name: Mitchell ChimesStacy L [name] Medical record number: [id number] Date of birth: 03/20/1971 Age: 43 y.o. Gender: male  Primary Care Provider: Caffie DammeSMITH, KARLA, MD  Chief Complaint: syncope, oral pain, hyperglycemia  History of Present Illness:This is a 43 y.o. year old male with prior history of poorly controlled insulin-dependent diabetes, systolic heart failure with a ejection fraction of 20-25% based on echo April 2015, hypertension and, hyperlipidemia presented with syncopal episode. Patient states that he has a severe oral/dental pain for the past 3-4 months. Nose he has severe dental caries and is pending for a pain extraction of his teeth. Has run into extraction as a cost issues. Patient states he's been taking high-dose NSAIDs and every 8 hours to help relieve the pain in his mouth. Reports he has a prior history of NSAID allergies including rash and? Angioedema. Patient states he's been taking heavy amounts of Goody powders as well as Aleve. He did another these were NSAIDs. States that he called his wife stating that he was in severe pain in his mouth. He does not remember anything after that. Believes that he was at home he called his wife. Patient is unsure but does think he may have had some chest discomfort and nausea prior to event. Has had predominant epigastric discomfort and pain. Denies any vomiting/coffee ground emesis. Per report, patient was found down in the parking lot of his wife's job. Patient remained unconscious until about halfway through EMS transport. Has not been checking blood sugars her blood pressures at home. On arrival to the ER, MAXIMUM TEMPERATURE of 99.1. Heart rate in the 90s. Blood pressure in the 130s to 170s over 70s to 110s. Satting greater than 95% on room air. White blood cell count 7.5, hemoglobin 12.8. Blood sugar 548. Bicarbonate of 23. Chest x-ray within normal limits. Patient hydrated with blood sugars going into  the 300s.   Patient Active Problem List   Diagnosis Date Noted  . Syncope 01/07/2014  . Acute respiratory failure with hypoxia 11/25/2013  . CHF (congestive heart failure) 11/22/2013  . PNA (pneumonia) 11/22/2013  . CAP (community acquired pneumonia) 07/22/2013  . Community acquired bacterial pneumonia 07/22/2013  . Chronic systolic heart failure 10/02/2011  . Erectile dysfunction 10/02/2011  . Acute on chronic systolic heart failure 09/23/2011  . OBSTRUCTIVE SLEEP APNEA 10/11/2007  . Type II diabetes mellitus, uncontrolled 10/10/2007  . HYPERLIPIDEMIA 10/10/2007  . HYPOKALEMIA 10/10/2007  . OBESITY 10/10/2007  . HYPERTENSION 10/10/2007  . PREMATURE VENTRICULAR CONTRACTIONS 10/10/2007  . SYNCOPE 10/10/2007  . COLONIC POLYPS, HX OF 10/10/2007   Past Medical History: Past Medical History  Diagnosis Date  . Hypertension   . Hyperlipidemia   . Depression   . OSA (obstructive sleep apnea)     On CPAP; NON-COMPLIANT  . CVA (cerebral infarction)     No residual deficits  . Non-ischemic cardiomyopathy     No ischemia on myoview, showed EF of 42%. 2D echo showed EF 50-55% with diatolic dysfunction in 2009  . Post-cardiac injury syndrome     History of cardiac injury from blunt trauma  . PVCs (premature ventricular contractions)   . History of colonic polyps   . Obesity   . Syncope     Recurrent, thought to be vasovagal. Also has h/o frequent PVCs.   . Chronic systolic heart failure 11/2010    a. NICM 4/14 EF 20-25%, TR mild  . SOB (shortness of breath)   . Stroke   . Herpes  simplex of male genitalia   . Myocardial infarction   . Headache(784.0)   . Diabetes mellitus     TYPE II; UNCONTROLLED BY HEMOGLOBIN A1c; STABLE AS  PER DISCHARGE  . CHF (congestive heart failure)     Past Surgical History: Past Surgical History  Procedure Laterality Date  . Cardiac catheterization  12/19/10    DIFFUSE NONOBSTRUCTIVE CAD; NONISCHEMIC CARDIOMYOPATHY; LEFT VENTRICULAR ANGIOGRAM WAS  PERFORMED SECONDARY TO  ELEVATED LEFT VENTRICULAR FILLING PRESSURES  . Right thumb      Social History: History   Social History  . Marital Status: Married    Spouse Name: N/A    Number of Children: N/A  . Years of Education: N/A   Social History Main Topics  . Smoking status: Never Smoker   . Smokeless tobacco: None  . Alcohol Use: No  . Drug Use: No  . Sexual Activity: Yes   Other Topics Concern  . 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    Family History: Family History  Problem Relation Age of Onset  . Heart disease Mother     MI  . Heart failure Mother   . Diabetes Mother     ALSO IN MOST OF HIS SIBLINGS; 2 UNLCES HAVE ALSO PASSED AWAY FROM DM  . Cardiomyopathy Mother     Allergies: Allergies  Allergen Reactions  . Nsaids Hives    Current Facility-Administered Medications  Medication Dose Route Frequency Provider Last Rate Last Dose  . [START ON 01/08/2014] carvedilol (COREG) tablet 25 mg  25 mg Oral BID WC Doree Albee, MD      . digoxin Margit Banda) tablet 0.25 mg  0.25 mg Oral Daily Doree Albee, MD      . heparin injection 5,000 Units  5,000 Units Subcutaneous 3 times per day Doree Albee, MD      . insulin aspart (novoLOG) injection 0-24 Units  0-24 Units Subcutaneous 6 times per day Doree Albee, MD      . isosorbide-hydrALAZINE (BIDIL) 20-37.5 MG per tablet 1 tablet  1 tablet Oral TID Doree Albee, MD      . losartan (COZAAR) tablet 100 mg  100 mg Oral Daily Doree Albee, MD      . magnesium oxide (MAG-OX) tablet 400 mg  400 mg Oral Daily Doree Albee, MD      . morphine 2 MG/ML injection 1-2 mg  1-2 mg Intravenous Q2H PRN Doree Albee, MD      . QUEtiapine (SEROQUEL) tablet 100 mg  100 mg Oral QHS Doree Albee, MD      . sertraline (ZOLOFT) tablet 100 mg  100 mg Oral BID Doree Albee, MD      . sodium  chloride 0.9 % injection 3 mL  3 mL Intravenous Q12H Doree Albee, MD      . spironolactone (ALDACTONE) tablet 50 mg  50 mg Oral Daily Doree Albee, MD       Current Outpatient Prescriptions  Medication Sig Dispense Refill  . carvedilol (COREG) 25 MG tablet Take 1 tablet (25 mg total) by mouth 2 (two) times daily with a meal.  60 tablet  0  . furosemide (LASIX) 40 MG tablet Take 1 tablet (40 mg total) by mouth 2 (two) times daily.  60 tablet  0  . isosorbide-hydrALAZINE (BIDIL) 20-37.5 MG per tablet Take 1  tablet by mouth 3 (three) times daily.  90 tablet  0  . losartan (COZAAR) 100 MG tablet Take 1 tablet (100 mg total) by mouth daily.  30 tablet  0  . magnesium oxide (MAG-OX) 400 (241.3 MG) MG tablet Take 1 tablet (400 mg total) by mouth daily.  30 tablet  0  . QUEtiapine (SEROQUEL) 100 MG tablet Take 1 tablet (100 mg total) by mouth at bedtime.  30 tablet  0  . sertraline (ZOLOFT) 100 MG tablet Take 1 tablet (100 mg total) by mouth 2 (two) times daily.  30 tablet  0  . spironolactone (ALDACTONE) 50 MG tablet Take 1 tablet (50 mg total) by mouth daily.  30 tablet  0  . digoxin (LANOXIN) 0.25 MG tablet Take 1 tablet (0.25 mg total) by mouth daily.  30 tablet  6   Review Of Systems: 12 point ROS negative except as noted above in HPI.  Physical Exam: Filed Vitals:   01/07/14 1842  BP: 170/116  Pulse: 91  Temp:   Resp: 16    General: alert and cooperative HEENT: PERRLA, extra ocular movement intact and poor dentition, dental caries  Heart: S1, S2 normal, no murmur, rub or gallop, regular rate and rhythm Lungs: clear to auscultation, no wheezes or rales and unlabored breathing Abdomen:obese abdomen, + epigastric TTP  Extremities: extremities normal, atraumatic, no cyanosis or edema Skin:no rashes, no ecchymoses Neurology: normal without focal findings  Labs and Imaging: Lab Results  Component Value Date/Time   NA 135* 01/07/2014  3:02 PM   K 4.1 01/07/2014  3:02 PM   CL 91*  01/07/2014  3:02 PM   CO2 23 01/07/2014  3:02 PM   BUN 13 01/07/2014  3:02 PM   CREATININE 0.88 01/07/2014  3:02 PM   GLUCOSE 548* 01/07/2014  3:02 PM   Lab Results  Component Value Date   WBC 7.5 01/07/2014   HGB 12.8* 01/07/2014   HCT 38.1* 01/07/2014   MCV 87.0 01/07/2014   PLT 173 01/07/2014   Urinalysis    Component Value Date/Time   COLORURINE COLORLESS* 01/07/2014 1535   APPEARANCEUR CLEAR 01/07/2014 1535   LABSPEC 1.021 01/07/2014 1535   PHURINE 5.0 01/07/2014 1535   GLUCOSEU >1000* 01/07/2014 1535   HGBUR NEGATIVE 01/07/2014 1535   BILIRUBINUR NEGATIVE 01/07/2014 1535   KETONESUR NEGATIVE 01/07/2014 1535   PROTEINUR NEGATIVE 01/07/2014 1535   UROBILINOGEN 0.2 01/07/2014 1535   NITRITE NEGATIVE 01/07/2014 1535   LEUKOCYTESUR NEGATIVE 01/07/2014 1535      Dg Chest 2 View  01/07/2014   CLINICAL DATA:  Near syncope; history of diabetes and congestive heart failure  EXAM: CHEST  2 VIEW  COMPARISON:  PA and lateral chest of November 22, 2013  FINDINGS: The lungs are adequately inflated and clear. The heart and mediastinal structures are normal. The bony structures are unremarkable.  IMPRESSION: There is no acute cardiopulmonary disease.   Electronically Signed   By: David  [place]   On: 01/07/2014 15:55     Assessment and Plan: Mitchell [name] is a 43 y.o. year old male presenting with syncope, oral pain, hyperglycemia  Syncope: Fairly broad differential diagnosis for her symptoms including vasovagal response in the setting of pain, hyperglycemia, orthostasis. We'll need to rule out neurocardiogenic sources. Marked systolic dysfunction on recent 2-D echocardiogram. Pro BNP pending. Check MRI of the brain without contrast. Cycle cardiac enzymes. Telemetry bed. Pain control.   CHF: Severe systolic dysfunction based on recent echocardiogram. ?qualifies for defibrillator  placement.. Pro BNP pending. No overt signs of volume overload on exam. Noted subtherapeutic digoxin level. We'll need to  uptitrate dose.   Oral pain: Severe dental caries on presentation. May need social work to help set up dental followup. When necessary morphine for pain.  Hypoglycemia: No anion gap or metabolic acidosis on presentation. Gluocomander for now. Most recent A1c 14.29 October 2013. Recheck.  Hypertension: Poorly controlled. Continue outpatient regimen. Hold diuretic given syncope.  FEN/GI: heart healthy cabr modified diet. Also with likely NSAID induced gastritis given abdominal tenderness on exam. Avoid offending agent. High-dose PPI. Consider GI consult.  Anemia: Likely anemia of chronic disease. Hemoglobin stable since last admission. Continue to follow.  Prophylaxis: Subcutaneous Heparin Disposition: Pending further evaluation Code Status: Full code       Doree Albee MD  Pager: [phone]

## 2014-01-07 NOTE — ED Notes (Signed)
Family at bedside. 

## 2014-01-08 DIAGNOSIS — K029 Dental caries, unspecified: Secondary | ICD-10-CM | POA: Diagnosis present

## 2014-01-08 DIAGNOSIS — E669 Obesity, unspecified: Secondary | ICD-10-CM

## 2014-01-08 DIAGNOSIS — I10 Essential (primary) hypertension: Secondary | ICD-10-CM

## 2014-01-08 DIAGNOSIS — I5022 Chronic systolic (congestive) heart failure: Secondary | ICD-10-CM

## 2014-01-08 DIAGNOSIS — E1310 Other specified diabetes mellitus with ketoacidosis without coma: Secondary | ICD-10-CM

## 2014-01-08 DIAGNOSIS — R55 Syncope and collapse: Secondary | ICD-10-CM

## 2014-01-08 DIAGNOSIS — E1110 Type 2 diabetes mellitus with ketoacidosis without coma: Secondary | ICD-10-CM | POA: Diagnosis present

## 2014-01-08 LAB — CBC WITH DIFFERENTIAL/PLATELET
Basophils Absolute: 0 10*3/uL (ref 0.0–0.1)
Basophils Relative: 0 % (ref 0–1)
Eosinophils Absolute: 0.1 10*3/uL (ref 0.0–0.7)
Eosinophils Relative: 2 % (ref 0–5)
HEMATOCRIT: 35.9 % — AB (ref 39.0–52.0)
Hemoglobin: 12.1 g/dL — ABNORMAL LOW (ref 13.0–17.0)
LYMPHS PCT: 30 % (ref 12–46)
Lymphs Abs: 2.5 10*3/uL (ref 0.7–4.0)
MCH: 29.4 pg (ref 26.0–34.0)
MCHC: 33.7 g/dL (ref 30.0–36.0)
MCV: 87.1 fL (ref 78.0–100.0)
MONO ABS: 0.5 10*3/uL (ref 0.1–1.0)
Monocytes Relative: 7 % (ref 3–12)
NEUTROS ABS: 5.1 10*3/uL (ref 1.7–7.7)
NEUTROS PCT: 61 % (ref 43–77)
Platelets: 162 10*3/uL (ref 150–400)
RBC: 4.12 MIL/uL — ABNORMAL LOW (ref 4.22–5.81)
RDW: 12.3 % (ref 11.5–15.5)
WBC: 8.3 10*3/uL (ref 4.0–10.5)

## 2014-01-08 LAB — BASIC METABOLIC PANEL
BUN: 12 mg/dL (ref 6–23)
CALCIUM: 8.7 mg/dL (ref 8.4–10.5)
CO2: 27 mEq/L (ref 19–32)
Chloride: 98 mEq/L (ref 96–112)
Creatinine, Ser: 0.69 mg/dL (ref 0.50–1.35)
Glucose, Bld: 283 mg/dL — ABNORMAL HIGH (ref 70–99)
POTASSIUM: 3.9 meq/L (ref 3.7–5.3)
SODIUM: 140 meq/L (ref 137–147)

## 2014-01-08 LAB — COMPREHENSIVE METABOLIC PANEL
ALT: 16 U/L (ref 0–53)
AST: 20 U/L (ref 0–37)
Albumin: 3.4 g/dL — ABNORMAL LOW (ref 3.5–5.2)
Alkaline Phosphatase: 81 U/L (ref 39–117)
BUN: 13 mg/dL (ref 6–23)
CO2: 28 mEq/L (ref 19–32)
Calcium: 8.8 mg/dL (ref 8.4–10.5)
Chloride: 98 mEq/L (ref 96–112)
Creatinine, Ser: 0.78 mg/dL (ref 0.50–1.35)
GFR calc non Af Amer: >90 mL/min (ref >90)
GLUCOSE: 280 mg/dL — AB (ref 70–99)
POTASSIUM: 3.7 meq/L (ref 3.7–5.3)
Sodium: 141 mEq/L (ref 137–147)
Total Bilirubin: 0.3 mg/dL (ref 0.3–1.2)
Total Protein: 7.1 g/dL (ref 6.0–8.3)

## 2014-01-08 LAB — GLUCOSE, CAPILLARY
GLUCOSE-CAPILLARY: 168 mg/dL — AB (ref 70–99)
GLUCOSE-CAPILLARY: 231 mg/dL — AB (ref 70–99)
GLUCOSE-CAPILLARY: 251 mg/dL — AB (ref 70–99)
Glucose-Capillary: 245 mg/dL — ABNORMAL HIGH (ref 70–99)
Glucose-Capillary: 222 mg/dL — ABNORMAL HIGH (ref 70–99)
Glucose-Capillary: 112 mg/dL — ABNORMAL HIGH (ref 70–99)

## 2014-01-08 LAB — TROPONIN I: Troponin I: <0.3 ng/mL (ref <0.30)

## 2014-01-08 LAB — KETONES, QUALITATIVE: ACETONE BLD: NEGATIVE

## 2014-01-08 MED ORDER — LIVING WELL WITH DIABETES BOOK
Freq: Once | Status: AC
  Administered 2014-01-08: 10:00:00
  Filled 2014-01-08: qty 1

## 2014-01-08 MED ORDER — LOSARTAN POTASSIUM 25 MG PO TABS
25.0000 mg | ORAL_TABLET | Freq: Every day | ORAL | Status: DC
  Administered 2014-01-08 – 2014-01-09 (×2): 25 mg via ORAL
  Filled 2014-01-08 (×2): qty 1

## 2014-01-08 MED ORDER — CARVEDILOL 6.25 MG PO TABS
6.2500 mg | ORAL_TABLET | Freq: Two times a day (BID) | ORAL | Status: DC
  Administered 2014-01-08 – 2014-01-09 (×2): 6.25 mg via ORAL
  Filled 2014-01-08 (×4): qty 1

## 2014-01-08 MED ORDER — ASPIRIN EC 325 MG PO TBEC: 325.0000 mg | DELAYED_RELEASE_TABLET | Freq: Every day | ORAL | Status: DC

## 2014-01-08 MED ORDER — HYDROCODONE-ACETAMINOPHEN 5-325 MG PO TABS
1.0000 | ORAL_TABLET | ORAL | Status: DC | PRN
  Administered 2014-01-08 (×2): 2 via ORAL
  Filled 2014-01-08 (×2): qty 2

## 2014-01-08 MED ORDER — SPIRONOLACTONE 25 MG PO TABS
25.0000 mg | ORAL_TABLET | Freq: Every day | ORAL | Status: DC
  Administered 2014-01-08 – 2014-01-09 (×2): 25 mg via ORAL
  Filled 2014-01-08 (×2): qty 1

## 2014-01-08 MED ORDER — PANTOPRAZOLE SODIUM 40 MG PO TBEC
40.0000 mg | DELAYED_RELEASE_TABLET | Freq: Two times a day (BID) | ORAL | Status: DC
  Administered 2014-01-08 – 2014-01-09 (×3): 40 mg via ORAL
  Filled 2014-01-08 (×3): qty 1

## 2014-01-08 MED ORDER — INSULIN ASPART PROT & ASPART (70-30 MIX) 100 UNIT/ML ~~LOC~~ SUSP
40.0000 [IU] | Freq: Two times a day (BID) | SUBCUTANEOUS | Status: DC
  Administered 2014-01-08 – 2014-01-09 (×3): 40 [IU] via SUBCUTANEOUS
  Filled 2014-01-08: qty 10

## 2014-01-08 NOTE — Care Management Note (Addendum)
    Page 1 of 2   01/08/2014     11:23:15 AM CARE MANAGEMENT NOTE 01/08/2014  Patient:  Mitchell [name], Mitchell [name]   Account Number:  [id number]  Date Initiated:  01/08/2014  Documentation initiated by:  GRAVES-BIGELOW,Sayer Masini  Subjective/Objective Assessment:   Pt admitted for near syncope questioning arrythmias and DKA. Pt is from home with wife.     Action/Plan:   CM did discuss with pt insurance and pt states has it through wife via [place]. PCP @ [facility] in [place]. F/u appointment placed in EPIC and benefits check in process for 70/30. [facility] has 70/30 for 25.00.   Anticipated DC Date:  01/09/2014   Anticipated DC Plan:  HOME/SELF CARE      DC Planning Services  CM consult  Medication Assistance  Follow-up appt scheduled      Choice offered to / List presented to:             Status of service:  In process, will continue to follow Medicare Important Message given?  NO (If response is "NO", the following Medicare IM given date fields will be blank) Date Medicare IM given:   Date Additional Medicare IM given:    Discharge Disposition:  HOME/SELF CARE  Per UR Regulation:  Reviewed for med. necessity/level of care/duration of stay  If discussed at Long Length of Stay Meetings, dates discussed:    Comments:   Benefits check completed and PER [employer] SCRIPT PT IS NOT COVERED AS OF 12/14. Pt will need to [facility] and get reli on brand 70/30 for $25.00. Will mkae pt aware.  ---01/08/2014 1023 by Tomi Bamberger---  Please check co pay / prior authorization for insulin aspart protamine- aspart (NOVOLOG MIX 70/30) injection 40   Ordered Dose: 40 Units Route: Subcutaneous Frequency: 2 times daily with meals   Administration Dose: 40 Units     Placed in EPIC:  Follow up with [facility] On 01/12/2014. [facility] f/u with MD Caffie Damme)  Contact information:  [address], [place], [place] [address] [phone] When CM made above  appointments: Office stated that pt had cancelled several appointments that were scheduled. CM did call the Heart Failure Clinic and Liaison will speak with pt in am in reference to f/u at the HF Clinic. CM will continue to monitor for additional needs.

## 2014-01-08 NOTE — Progress Notes (Signed)
At about 0400, pt BP was 88/56 manually.  A second RN confirmed at 90/60.  Pt easy to arouse, needs repeated stimulation to stay awake, alert and oriented x4, reports feeling "normal."  MD on call, Dr. Craige Cotta, notified.  Orders given to recheck BP around 0600 and call if SBP less than 90.  Will continue to monitor.

## 2014-01-08 NOTE — Progress Notes (Signed)
RD consulted for diet education. Consult: "Has type 2 diabetes and on insulin. Needs suggestions for soft foods to eat due to current dental issues." RD familiar with pt from previous admission at which time RD provided diabetes diet education. Pt states he has no further questions or concerns regarding diabetic diet or carb counting. He reports oral pain but, states he is chewing and swallowing food fine and denies any needs for texture modification. Pt not interested in receiving any diet education at this time. Encouraged pt to request RD to be paged if he has any further nutrition related questions or concerns.  Ian Malkin RD, LDN Inpatient Clinical Dietitian Pager: [phone] After Hours Pager: [phone]

## 2014-01-08 NOTE — Progress Notes (Addendum)
[facility] PROGRESS NOTE  Mitchell [name] TKP:[id number] DOB: 1971/04/26 DOA: 01/07/2014 PCP: Mitchell Axe, MD  Assessment/Plan:  Principal Problem:   Syncope: suspect vasovagal in the setting of DKA (see below). Exam is nonfocal, and stroke unlikely. No need for MRI. Could also be arrhythmia, given low ejection fraction. Reviewed notes from heart failure clinic last year. ICD had been discussed but patient declined. Will need followup with heart failure clinic as an outpatient. Not in acute heart failure at this time. Continue telemetry. Change to inpatient. Active Problems:    DKA, type 2, not at goal:  Initial bicarbonate was normal, but anion gap was 21. This morning's anion gap Is 15. Patient reports that he never filled his insulin last hospitalization because the co-pay was $500! I suspect he was mistaken, as he was apparently discharged on 70/30 insulin which is one of the most affordable types. Will resume 70/30 insulin and asked diabetic educator to consult for patient education. Will also ask care manager to price out insulins. Hold off on insulin drip for now as Closing, and repeat be met at noon and tomorrow morning.  Multiple caries and fractured teeth: Appears to need multiple extractions. Patient reports having seen a dentist as an outpatient. Cannot recall the name. Apparently needed $5000 up front for extractions, so he did not carry this out. He's been taking Goody's frequently. Tooth pain is his main complaint, and remembers feeling excruciating pain prior to his syncopal episode, which is why I believe he had vasovagal syncope.  Discussed the case with Dr. Lawana [name] who may be able to perform necessary extractions as an outpatient.  Chronic systolic heart failure: Euvolemic. Patient reports being compliant with his cardiac and blood pressure medications. Well as Network engineer to schedule followup with heart failure clinic as an outpatient. Discussion regarding ICD will need to  revisited  Reported hypotension overnight: Blood pressure currently normal. Will decrease by bidil, coreg, ARB.      HYPERLIPIDEMIA    OBESITY    OBSTRUCTIVE SLEEP APNEA    HYPERTENSION   abdominal pain: has resolved. May be related to [facility] powders. No need for GI evaluation at this time. Will treat with empiric Protonix and avoid further aspirin products.   Code Status:  full Family Communication:   Disposition Plan:  home  Consultants:    Procedures:     Antibiotics:    HPI/Subjective:  has not been on insulin for months. Complaining of tooth pain. No chest pain abdominal pain dyspnea. Reports compliance with other medications.   Objective: Filed Vitals:   01/08/14 0721  BP: 124/78  Pulse: 89  Temp: 98.9 F (37.2 C)  Resp: 14   Telemetry: Normal sinus rhythm.   Intake/Output Summary (Last 24 hours) at 01/08/14 0846 Last data filed at 01/08/14 0827  Gross per 24 hour  Intake    222 ml  Output   1180 ml  Net   -958 ml   Filed Weights   01/07/14 2042  Weight: 124.785 kg (275 lb 1.6 oz)    Exam:   General:   alert oriented comfortable. Breathing nonlabored.   HEENT: Multiple fractured and carried teeth. No definite abscess noted   Cardiovascular:  regular rate rhythm without murmurs gallops rubs   Respiratory:  clear to auscultation bilaterally without wheezes rhonchi or rales   Abdomen:  obese soft nontender nondistended. Normal bowel sounds   Ext:  no clubbing cyanosis or edema   Basic Metabolic Panel:  Recent Labs Lab 01/07/14 1502  01/07/14 1955 01/08/14 0101  NA 135*  --  141  K 4.1  --  3.7  CL 91*  --  98  CO2 23  --  28  GLUCOSE 548*  --  280*  BUN 13  --  13  CREATININE 0.88 0.73 0.78  CALCIUM 9.0  --  8.8   Liver Function Tests:  Recent Labs Lab 01/07/14 1502 01/08/14 0101  AST 27 20  ALT 28 16  ALKPHOS 116 81  BILITOT <0.2* 0.3  PROT 7.8 7.1  ALBUMIN 3.7 3.4*    Recent Labs Lab 01/07/14 1502  LIPASE 50    No results found for this basename: AMMONIA,  in the last 168 hours CBC:  Recent Labs Lab 01/07/14 1502 01/07/14 1955 01/08/14 0101  WBC 7.5 7.9 8.3  NEUTROABS  --   --  5.1  HGB 12.8* 12.8* 12.1*  HCT 38.1* 36.9* 35.9*  MCV 87.0 86.0 87.1  PLT 173 169 162   Cardiac Enzymes:  Recent Labs Lab 01/07/14 1502 01/07/14 1955 01/08/14 0101  TROPONINI <0.30 <0.30 <0.30   BNP (last 3 results)  Recent Labs  11/22/13 1005 11/23/13 0420 01/07/14 1955  PROBNP 533.6* 233.2* 235.1*   CBG:  Recent Labs Lab 01/07/14 2000 01/07/14 2212 01/08/14 0017 01/08/14 0408 01/08/14 0720  GLUCAP 302* 330* 231* 251* 245*    No results found for this or any previous visit (from the past 240 hour(s)).   Studies: Dg Chest 2 View  01/07/2014   CLINICAL DATA:  Near syncope; history of diabetes and congestive heart failure  EXAM: CHEST  2 VIEW  COMPARISON:  PA and lateral chest of November 22, 2013  FINDINGS: The lungs are adequately inflated and clear. The heart and mediastinal structures are normal. The bony structures are unremarkable.  IMPRESSION: There is no acute cardiopulmonary disease.   Electronically Signed   By: Mitchell  [name]   On: 01/07/2014 15:55    Scheduled Meds: . carvedilol  25 mg Oral BID WC  . digoxin  0.25 mg Oral Daily  . heparin  5,000 Units Subcutaneous 3 times per day  . insulin aspart  0-24 Units Subcutaneous 6 times per day  . isosorbide-hydrALAZINE  1 tablet Oral TID  . losartan  100 mg Oral Daily  . magnesium oxide  400 mg Oral Daily  . pantoprazole (PROTONIX) IV  40 mg Intravenous Q12H  . QUEtiapine  100 mg Oral QHS  . sertraline  100 mg Oral BID  . sodium chloride  3 mL Intravenous Q12H  . spironolactone  50 mg Oral Daily   Continuous Infusions:   Time spent: 45 minutes  Mitchell Redwood, MD [facility] Pager [phone]. If 7PM-7AM, please contact night-coverage at [email or web address], password [facility] 01/08/2014, 8:46 AM  LOS: 1 day

## 2014-01-08 NOTE — Progress Notes (Addendum)
Spoke with patient about his diabetes.  Was not sure when he was diagnosed, but said at least 10 years ago.  Sees Dr. Caffie Damme at the [facility].  Had been on insulin pen before, but insurance was not covering the medication.  Was not on any insulin for about 2 months.  Has been having major dental problems and with much pain.  HgbA1C is 14.1% mainly because he was not taking his insulin.  Has been started on 70/30 mixed insulin 40 units BID in the hospital.  Explained the importance of taking with breakfast and dinner and having to eat with this insulin. He agreed with this situation.   Patient is determined to get his teeth pulled so that he can function better.  He may need soft food to eat until his teeth can be extracted.  Concerned about his health insurance covering some of his medications.  Would be satisfied with taking Reli-[employer] [facility] 70/30 mixed insulin for $25 per bottle.  Will continue to follow while in hospital.  Smith Mince RN BSN CDE

## 2014-01-09 DIAGNOSIS — K029 Dental caries, unspecified: Secondary | ICD-10-CM

## 2014-01-09 LAB — BASIC METABOLIC PANEL
BUN: 12 mg/dL (ref 6–23)
CO2: 27 mEq/L (ref 19–32)
CREATININE: 0.73 mg/dL (ref 0.50–1.35)
Calcium: 8.9 mg/dL (ref 8.4–10.5)
Chloride: 101 mEq/L (ref 96–112)
GFR calc Af Amer: >90 mL/min (ref >90)
Glucose, Bld: 171 mg/dL — ABNORMAL HIGH (ref 70–99)
Potassium: 3.6 mEq/L — ABNORMAL LOW (ref 3.7–5.3)
SODIUM: 142 meq/L (ref 137–147)

## 2014-01-09 LAB — GLUCOSE, CAPILLARY: GLUCOSE-CAPILLARY: 170 mg/dL — AB (ref 70–99)

## 2014-01-09 MED ORDER — PANTOPRAZOLE SODIUM 40 MG PO TBEC: 40.0000 mg | DELAYED_RELEASE_TABLET | Freq: Every day | ORAL | Status: DC

## 2014-01-09 MED ORDER — POTASSIUM CHLORIDE CRYS ER 20 MEQ PO TBCR
40.0000 meq | EXTENDED_RELEASE_TABLET | Freq: Once | ORAL | Status: AC
  Administered 2014-01-09: 40 meq via ORAL
  Filled 2014-01-09: qty 2

## 2014-01-09 MED ORDER — INSULIN ASPART PROT & ASPART (70-30 MIX) 100 UNIT/ML ~~LOC~~ SUSP: 40.0000 [IU] | Freq: Two times a day (BID) | SUBCUTANEOUS | Status: DC

## 2014-01-09 MED ORDER — HYDROCODONE-ACETAMINOPHEN 5-325 MG PO TABS: 1.0000 | ORAL_TABLET | ORAL | Status: DC | PRN

## 2014-01-09 NOTE — Discharge Instructions (Signed)
Abscess An abscess (boil or furuncle) is an infected area on or under the skin. This area is filled with yellowish-white fluid (pus) and other material (debris). HOME CARE   Only take medicines as told by your doctor.  If you were given antibiotic medicine, take it as directed. Finish the medicine even if you start to feel better.  If gauze is used, follow your doctor's directions for changing the gauze.  To avoid spreading the infection:  Keep your abscess covered with a bandage.  Wash your hands well.  Do not share personal care items, towels, or whirlpools with others.  Avoid skin contact with others.  Keep your skin and clothes clean around the abscess.  Keep all doctor visits as told. GET HELP RIGHT AWAY IF:   You have more pain, puffiness (swelling), or redness in the wound site.  You have more fluid or blood coming from the wound site.  You have muscle aches, chills, or you feel sick.  You have a fever. MAKE SURE YOU:   Understand these instructions.  Will watch your condition.  Will get help right away if you are not doing well or get worse. Document Released: 01/03/2008 Document Revised: 01/16/2012 Document Reviewed: 09/29/2011 [facility] Patient Information 2014 [place], [place].  Blood Glucose Monitoring, Adult Monitoring your blood glucose (also know as blood sugar) helps you to manage your diabetes. It also helps you and your health care provider monitor your diabetes and determine how well your treatment plan is working. WHY SHOULD YOU MONITOR YOUR BLOOD GLUCOSE?  It can help you understand how food, exercise, and medicine affect your blood glucose.  It allows you to know what your blood glucose is at any given moment. You can quickly tell if you are having low blood glucose (hypoglycemia) or high blood glucose (hyperglycemia).  It can help you and your health care provider know how to adjust your medicines.  It can help you understand how to manage an  illness or adjust medicine for exercise. WHEN SHOULD YOU TEST? Your health care provider will help you decide how often you should check your blood glucose. This may depend on the type of diabetes you have, your diabetes control, or the types of medicines you are taking. Be sure to write down all of your blood glucose readings so that this information can be reviewed with your health care provider. See below for examples of testing times that your health care provider may suggest. Type 1 Diabetes  Test 4 times a day if you are in good control, using an insulin pump, or perform multiple daily injections.  If your diabetes is not well-controlled or if you are sick, you may need to monitor more often.  It is a good idea to also monitor:  Before and after exercise.  Between meals and 2 hours after a meal.  Occasionally between 2:00 to 3:00 am. Type 2 Diabetes  It can vary with each person, but generally, if you are on insulin, test 4 times a day.  If you take medicines by mouth (orally), test 2 times a day.  If you are on a controlled diet, test once a day.  If your diabetes is not well controlled or if you are sick, you may need to monitor more often. HOW TO MONITOR YOUR BLOOD GLUCOSE Supplies Needed  Blood glucose meter.  Test strips for your meter. Each meter has its own strips. You must use the strips that go with your own meter.  A pricking  needle (lancet).  A device that holds the lancet (lancing device).  A journal or log book to write down your results. Procedure  Wash your hands with soap and water. Alcohol is not preferred.  Prick the side of your finger (not the tip) with the lancet.  Gently milk the finger until a small drop of blood appears.  Follow the instructions that come with your meter for inserting the test strip, applying blood to the strip, and using your blood glucose meter. Other Areas to Get Blood for Testing Some meters allow you to use other areas  of your body (other than your finger) to test your blood. These areas are called alternative sites. The most common alternative sites are:  The forearm.  The thigh.  The back area of the lower leg.  The palm of the hand. The blood flow in these areas is slower. Therefore, the blood glucose values you get may be delayed, and the numbers are different from what you would get from your fingers. Do not use alternative sites if you think you are having hypoglycemia. Your reading will not be accurate. Always use a finger if you are having hypoglycemia. Also, if you cannot feel your lows (hypoglycemia unawareness), always use your fingers for your blood glucose checks. ADDITIONAL TIPS FOR GLUCOSE MONITORING  Do not reuse lancets.  Always carry your supplies with you.  All blood glucose meters have a 24-hour "hotline" number to call if you have questions or need help.  Adjust (calibrate) your blood glucose meter with a control solution after finishing a few boxes of strips. BLOOD GLUCOSE RECORD KEEPING It is a good idea to keep a daily record or log of your blood glucose readings. Most glucose meters, if not all, keep your glucose records stored in the meter. Some meters come with the ability to download your records to your home computer. Keeping a record of your blood glucose readings is especially helpful if you are wanting to look for patterns. Make notes to go along with the blood glucose readings because you might forget what happened at that exact time. Keeping good records helps you and your health care provider to work together to achieve good diabetes management.  Document Released: 07/20/2003 Document Revised: 03/19/2013 Document Reviewed: 12/09/2012 [facility] Patient Information 2014 [place], [place].  Dental Care and Dentist Visits Dental care supports good overall health. Regular dental visits can also help you avoid dental pain, bleeding, infection, and other more serious health  problems in the future. It is important to keep the mouth healthy because diseases in the teeth, gums, and other oral tissues can spread to other areas of the body. Some problems, such as diabetes, heart disease, and pre-term labor have been associated with poor oral health.  See your dentist every 6 months. If you experience emergency problems such as a toothache or broken tooth, go to the dentist right away. If you see your dentist regularly, you may catch problems early. It is easier to be treated for problems in the early stages.  WHAT TO EXPECT AT A DENTIST VISIT  Your dentist will look for many common oral health problems and recommend proper treatment. At your regular dental visit, you can expect:  Gentle cleaning of the teeth and gums. This includes scraping and polishing. This helps to remove the sticky substance around the teeth and gums (plaque). Plaque forms in the mouth shortly after eating. Over time, plaque hardens on the teeth as tartar. If tartar is not removed  regularly, it can cause problems. Cleaning also helps remove stains.  Periodic X-rays. These pictures of the teeth and supporting bone will help your dentist assess the health of your teeth.  Periodic fluoride treatments. Fluoride is a natural mineral shown to help strengthen teeth. Fluoride treatmentinvolves applying a fluoride gel or varnish to the teeth. It is most commonly done in children.  Examination of the mouth, tongue, jaws, teeth, and gums to look for any oral health problems, such as:  Cavities (dental caries). This is decay on the tooth caused by plaque, sugar, and acid in the mouth. It is best to catch a cavity when it is small.  Inflammation of the gums caused by plaque buildup (gingivitis).  Problems with the mouth or malformed or misaligned teeth.  Oral cancer or other diseases of the soft tissues or jaws. KEEP YOUR TEETH AND GUMS HEALTHY For healthy teeth and gums, follow these general guidelines as  well as your dentist's specific advice:  Have your teeth professionally cleaned at the dentist every 6 months.  Brush twice daily with a fluoride toothpaste.  Floss your teeth daily.  Ask your dentist if you need fluoride supplements, treatments, or fluoride toothpaste.  Eat a healthy diet. Reduce foods and drinks with added sugar.  Avoid smoking. TREATMENT FOR ORAL HEALTH PROBLEMS If you have oral health problems, treatment varies depending on the conditions present in your teeth and gums.  Your caregiver will most likely recommend good oral hygiene at each visit.  For cavities, gingivitis, or other oral health disease, your caregiver will perform a procedure to treat the problem. This is typically done at a separate appointment. Sometimes your caregiver will refer you to another dental specialist for specific tooth problems or for surgery. SEEK IMMEDIATE DENTAL CARE IF:  You have pain, bleeding, or soreness in the gum, tooth, jaw, or mouth area.  A permanent tooth becomes loose or separated from the gum socket.  You experience a blow or injury to the mouth or jaw area. Document Released: 03/29/2011 Document Revised: 10/09/2011 Document Reviewed: 03/29/2011 [facility] Patient Information 2014 [place].  Heart Failure Heart failure means your heart has trouble pumping blood. This makes it hard for your body to work well. Heart failure is usually a long-term (chronic) condition. You must take good care of yourself and follow your doctor's treatment plan. HOME CARE  Take your heart medicine as told by your doctor.  Do not stop taking medicine unless your doctor tells you to.  Do not skip any dose of medicine.  Refill your medicines before they run out.  Take other medicines only as told by your doctor or pharmacist.  Stay active if told by your doctor. The elderly and people with severe heart failure should talk with a doctor about physical activity.  Eat heart  healthy foods. Choose foods that are without trans fat and are low in saturated fat, cholesterol, and salt (sodium). This includes fresh or frozen fruits and vegetables, fish, lean meats, fat-free or low-fat dairy foods, whole grains, and high-fiber foods. Lentils and dried peas and beans (legumes) are also good choices.  Limit salt if told by your doctor.  Cook in a healthy way. Roast, grill, broil, bake, poach, steam, or stir-fry foods.  Limit fluids as told by your doctor.  Weigh yourself every morning. Do this after you pee (urinate) and before you eat breakfast. Write down your weight to give to your doctor.  Take your blood pressure and write it down if  your doctor tell you to.  Ask your doctor how to check your pulse. Check your pulse as told.  Lose weight if told by your doctor.  Stop smoking or chewing tobacco. Do not use gum or patches that help you quit without your doctor's approval.  Schedule and go to doctor visits as told.  Nonpregnant women should have no more than 1 drink a day. Men should have no more than 2 drinks a day. Talk to your doctor about drinking alcohol.  Stop illegal drug use.  Stay current with shots (immunizations).  Manage your health conditions as told by your doctor.  Learn to manage your stress.  Rest when you are tired.  If it is really hot outside:  Avoid intense activities.  Use air conditioning or fans, or get in a cooler place.  Avoid caffeine and alcohol.  Wear loose-fitting, lightweight, and light-colored clothing.  If it is really cold outside:  Avoid intense activities.  Layer your clothing.  Wear mittens or gloves, a hat, and a scarf when going outside.  Avoid alcohol.  Learn about heart failure and get support as needed.  Get help to maintain or improve your quality of life and your ability to care for yourself as needed. GET HELP IF:   You gain 03 lb/1.4 kg or more in 1 day or 05 lb/2.3 kg in a week.  You are  more short of breath than usual.  You cannot do your normal activities.  You tire easily.  You cough more than normal, especially with activity.  You have any or more puffiness (swelling) in areas such as your hands, feet, ankles, or belly (abdomen).  You cannot sleep because it is hard to breathe.  You feel like your heart is beating fast (palpitations).  You get dizzy or lightheaded when you stand up. GET HELP RIGHT AWAY IF:   You have trouble breathing.  There is a change in mental status, such as becoming less alert or not being able to focus.  You have chest pain or discomfort.  You faint. MAKE SURE YOU:   Understand these instructions.  Will watch your condition.  Will get help right away if you are not doing well or get worse. Document Released: 04/25/2008 Document Revised: 11/11/2012 Document Reviewed: 02/15/2012 [facility] Patient Information 2014 [place], [place].  Insulin Treatment for Diabetes Diabetes is a disease that does not go away (chronic). It occurs when the body does not properly use the sugar (glucose) that is released from food after it is digested. Glucose levels are controlled by a hormone called insulin, which is made by your pancreas. Depending on the type of diabetes you have, either of the following will apply:   The pancreas does not make any insulin (type 1 diabetes).  The pancreas makes too little insulin, and the body cannot respond normally to the insulin that is made (type 2 diabetes). Without insulin, death can occur. However, with the addition of insulin, blood sugar monitoring, and treatment, someone with diabetes can live a full and productive life. This document will discuss the role of insulin in your treatment and provide information about its use.  HOW IS INSULIN GIVEN? Insulin is a medicine that can only be given by injection. Taking it by mouth makes it inactive because of the acid in your stomach. Insulin is injected under the skin  by a syringe and needle, an insulin pen, a pump, or a jet injector. Your dose will be determined by your health care  provider based on your individual needs. You will also be given guidance on which method of giving insulin is right for you. Remember that if you give insulin with a needle and syringe, you must do so using only a special insulin syringe made for this purpose. WHERE ON THE BODY SHOULD INSULIN BE INJECTED? Insulin is injected into the fatty layer of tissue just under your skin. Good places to inject insulin include the upper arm, the front and outer area of the thigh, the hips, and the abdomen. Giving your insulin in the abdomen is preferred because this provides the most rapid and consistent absorption. Avoid the area 2 inches (5 cm) around the navel and avoid injecting into areas on your body with scar tissue. In addition, it is important to rotate your injection sites with every shot to prevent irritation and improve absorption. WHAT ARE THE DIFFERENT TYPES OF INSULIN?  If you have type 1 diabetes, you must take insulin to stay alive. Your body does not produce it. If you have type 2 diabetes, you might require insulin in addition to, or instead of, other medicines. In either case, proper use of insulin is critical to control your diabetes.  There are a number of different types of insulin. Usually, you will give yourself injections, though others can be trained to give them to you. Some people have an insulin pump that delivers insulin continuously through a tube (cannula) that is placed under the skin. Using insulin requires that you check your blood sugar several times a day. The exact number of times and time of day to check will vary depending on your type of diabetes, your type of insulin, and treatment goals. Your health care provider will direct you.  Generally, different insulins have different properties. The following is a general guide. Specifics will vary by product, and new  products are introduced periodically.   Rapid-acting insulin starts working quickly (in as little as 5 minutes) and wears off in 4 to 6 hours (sometimes longer). This type of insulin works well when taken just before a meal to bring your blood sugar quickly back to normal.   Short-acting insulin starts working in about 30 minutes and can last 6 to 10 hours. This type of insulin should be taken about 30 minutes before you start eating a meal.  Intermediate-acting insulin starts working in 1-2 hours and wears off after about 10 to 18 hours. This insulin will lower your blood sugar for a longer period of time, but it will not be as effective in lowering your blood sugar right after a meal.   Long-acting insulin mimics the small amount of insulin that your pancreas usually produces throughout the day. You need to have some insulin present at all times. It is crucial to the metabolism of brain cells and other cells. Long-acting insulin is meant to be used either once or twice a day. It is usually used in combination with other types of insulin, or in combination with other diabetes medicines.  Discuss the type of insulin you are taking with your health care provider or pharmacist. You will then be aware of when the insulin can be expected to peak and when it will wear off. This is important to know so you can plan for meal times and periods of exercise.  Your health care provider will usually have a strategy in mind when treating you with insulin. This will vary with your type of diabetes, your diabetes treatment goals, and your health  history. It is important that you understand this strategy so you can be an active partner in treating your diabetes. Here are some terms you might hear:   Basal insulin. This refers to the small amount of insulin that needs to be present in your blood at all times. Sometimes oral medicines will be enough. For other people, and especially for people with type 1 diabetes,  insulin is needed. Usually, intermediate-acting or long-acting insulin is used once or twice a day to accomplish this.   Prandial (meal-related) insulin. Your blood sugar will rise rapidly after a meal. Rapid-acting or short-acting insulin can be used right before the meal to bring your blood sugar back to normal quickly. You might be instructed to adjust the amount of insulin depending on how much carbohydrate (starch) is in your meal.   Corrective insulin. You might be instructed to check your blood sugar at certain times of the day. You then might use a small amount of rapid-acting or short-acting insulin to bring the blood sugar down to normal if it is elevated.   Tight control (also called intensive therapy). Tight control means keeping your blood sugar as close to your target as possible and keeping it from going too high after meals. People with tight control of their diabetes are shown to have fewer long-term complications from their diabetes.   Glycohemoglobin (also called glyco, glycosylated hemoglobin, hemoglobin A1c, or A1c) level. This measures how well your blood sugar has been controlled during the past 1 to 3 months. It helps your health care provider see how effective your treatment is and decide if any changes are needed. Your health care provider will discuss your target glycohemoglobin level with you.  Insulin treatment requires your careful attention. Treatment plans will be different for different people. Some people do well with a simple program. Others require more complicated programs, with multiple insulin injections daily. You will work with your health care provider to develop the best program for you. Regardless of your insulin treatment plan, you must also do your best on weight control, diet and food choices, exercise, blood pressure control, cholesterol control, and stress levels.  WHAT ARE THE SIDE EFFECTS OF INSULIN? Although insulin treatment is important, it does  have some side effects, such as:   Insulin can cause your blood sugar to go too low (hypoglycemia).   Weight gain can occur.   Improper injection technique can cause hypoglycemia, blood sugar going too high (hyperglycemia), skin injury or irritation, or other problems. You must learn to inject insulin properly. Document Released: 10/13/2008 Document Revised: 03/19/2013 Document Reviewed: 12/29/2012 [facility] Patient Information 2014 [place], [place].  Syncope Syncope means a person passes out (faints). The person usually wakes up in less than 5 minutes. It is important to seek medical care for syncope. HOME CARE  Have someone stay with you until you feel normal.  Do not drive, use machines, or play sports until your doctor says it is okay.  Keep all doctor visits as told.  Lie down when you feel like you might pass out. Take deep breaths. Wait until you feel normal before standing up.  Drink enough fluids to keep your pee (urine) clear or pale yellow.  If you take blood pressure or heart medicine, get up slowly. Take several minutes to sit and then stand. GET HELP RIGHT AWAY IF:   You have a severe headache.  You have pain in the chest, belly (abdomen), or back.  You are bleeding from the mouth  or butt (rectum).  You have black or tarry poop (stool).  You have an irregular or very fast heartbeat.  You have pain with breathing.  You keep passing out, or you have shaking (seizures) when you pass out.  You pass out when sitting or lying down.  You feel confused.  You have trouble walking.  You have severe weakness.  You have vision problems. If you fainted, call your local emergency services (911 in [place] Do not drive yourself to the hospital. MAKE SURE YOU:   Understand these instructions.  Will watch your condition.  Will get help right away if you are not doing well or get worse. Document Released: 01/03/2008 Document Revised: 01/16/2012 Document Reviewed:  09/15/2011 [facility] Patient Information 2014 [place], [place].

## 2014-01-09 NOTE — Progress Notes (Signed)
Reviewed discharge instructions with patient and wife, they stated their understanding.  Patient states he is able to administer his own insulin injections at home.  Reinforced need for patient to bring all home medications to followup appointment with heart failure clinic.  Discharged home via wheelchair with wife.  Colman Cater

## 2014-01-09 NOTE — Discharge Summary (Signed)
Physician Discharge Summary  Mitchell ChimesStacy L [name] ZOX:[id number] DOB: 12/15/1970 DOA: 01/07/2014  PCP: Caffie DammeSMITH, KARLA, MD  Admit date: 01/07/2014 Discharge date: 01/09/2014  Recommendations for Outpatient Follow-up:  1. Optimize diabetes control  Discharge Diagnoses:  Principal Problem:   Syncope Active Problems:   HYPERLIPIDEMIA   OBESITY   OBSTRUCTIVE SLEEP APNEA   HYPERTENSION   Chronic systolic heart failure   DKA, type 2, not at goal   Dental caries, multiple  Discharge Condition: stable  Filed Weights   01/07/14 2042 01/08/14 1004  Weight: 124.785 kg (275 lb 1.6 oz) 125.556 kg (276 lb 12.8 oz)    History of present illness:  43 y.o. year old male with prior history of poorly controlled insulin-dependent diabetes, systolic heart failure with a ejection fraction of 20-25% based on echo April 2015, hypertension and, hyperlipidemia presented with syncopal episode. Patient states that he has a severe oral/dental pain for the past 3-4 months. Nose he has severe dental caries and is pending for a pain extraction of his teeth. Has run into extraction as a cost issues. Patient states he's been taking high-dose NSAIDs and every 8 hours to help relieve the pain in his mouth. Reports he has a prior history of NSAID allergies including rash and? Angioedema. Patient states he's been taking heavy amounts of Goody powders as well as Aleve. He did another these were NSAIDs. States that he called his wife stating that he was in severe pain in his mouth. He does not remember anything after that. Believes that he was at home he called his wife. Patient is unsure but does think he may have had some chest discomfort and nausea prior to event. Has had predominant epigastric discomfort and pain. Denies any vomiting/coffee ground emesis. Per report, patient was found down in the parking lot of his wife's job. Patient remained unconscious until about halfway through EMS transport. Has not been checking blood sugars  her blood pressures at home.  On arrival to the ER, MAXIMUM TEMPERATURE of 99.1. Heart rate in the 90s. Blood pressure in the 130s to 170s over 70s to 110s. Satting greater than 95% on room air. White blood cell count 7.5, hemoglobin 12.8. Blood sugar 548. Bicarbonate of 23. Chest x-ray within normal limits. Patient hydrated with blood sugars going into the 300s.   Hospital Course:  Admitted to telemetry. Started back on insulin. Upon further questioning, has been off insulin since last discharge from hospital due to cost.  Initial Anion gap was 21.  By discharge, gap closed and CBGs well controlled.  Abdominal pain resolved off aspirin products and with empiric protonix.  Advised against NSAIDs or aspirin products.  Tooth pain controlled on vicodin.  Discussed with Dr. Kristin BruinsKulinski who will f/u as outpatient.  Patient was given diabetic teaching.  Care management confirmed 70/30 insulin Rx affordable for patient.  No further syncope. No dysrhythmias. Suspect syncope secondary to vasovagal in the setting of DKA.  Bidil stopped, as patient already on ARB, and blood pressure fine without it.  Appointment scheduled for PCP and CHF clinic (has not been seen there for over a year, and recent no-show).  Procedures:  none  Consultations: None  Discharge Exam: Filed Vitals:   01/09/14 0512  BP: 133/80  Pulse: 87  Temp: 98.1 F (36.7 C)  Resp: 18    General: comfortable. Alert. Oriented. Cardiovascular: RRR without MGR Respiratory: CTA without WRR  Discharge Instructions   Activity as tolerated - No restrictions    Complete by:  As directed      Diet - low sodium heart healthy    Complete by:  As directed      Diet Carb Modified    Complete by:  As directed      Discharge instructions    Complete by:  As directed   STOP BIDIL (ISOSORBIDE-HYDRALAZINE). MONITOR BLOOD GLUCOSE TWICE DAILY AT BREAKFAST AND SUPPER            Medication List    STOP taking these medications        isosorbide-hydrALAZINE 20-37.5 MG per tablet  Commonly known as:  BIDIL      TAKE these medications       carvedilol 25 MG tablet  Commonly known as:  COREG  Take 1 tablet (25 mg total) by mouth 2 (two) times daily with a meal.     digoxin 0.25 MG tablet  Commonly known as:  LANOXIN  Take 1 tablet (0.25 mg total) by mouth daily.     furosemide 40 MG tablet  Commonly known as:  LASIX  Take 1 tablet (40 mg total) by mouth 2 (two) times daily.     HYDROcodone-acetaminophen 5-325 MG per tablet  Commonly known as:  NORCO/VICODIN  Take 1-2 tablets by mouth every 4 (four) hours as needed for moderate pain.     insulin aspart protamine- aspart (70-30) 100 UNIT/ML injection  Commonly known as:  NOVOLOG MIX 70/30  Inject 0.4 mLs (40 Units total) into the skin 2 (two) times daily with a meal.     losartan 100 MG tablet  Commonly known as:  COZAAR  Take 1 tablet (100 mg total) by mouth daily.     magnesium oxide 400 (241.3 MG) MG tablet  Commonly known as:  MAG-OX  Take 1 tablet (400 mg total) by mouth daily.     pantoprazole 40 MG tablet  Commonly known as:  PROTONIX  Take 1 tablet (40 mg total) by mouth daily. For 2 weeks only     QUEtiapine 100 MG tablet  Commonly known as:  SEROQUEL  Take 1 tablet (100 mg total) by mouth at bedtime.     sertraline 100 MG tablet  Commonly known as:  ZOLOFT  Take 1 tablet (100 mg total) by mouth 2 (two) times daily.     spironolactone 50 MG tablet  Commonly known as:  ALDACTONE  Take 1 tablet (50 mg total) by mouth daily.       Allergies  Allergen Reactions  . Nsaids Anaphylaxis       Follow-up Information   Follow up with [facility] On 01/12/2014. [facility] f/u with MD Caffie Damme)    Contact information:   [address], [place], [place] [address] [phone]      Follow up with [facility] On 01/15/2014. (@ 10:45 am. Barnett Hatter Code 0400. Please bring all your medications to  your visit)    Specialty:  Cardiology   Contact information:   [address] [id number] [place] [address] [phone]       The results of significant diagnostics from this hospitalization (including imaging, microbiology, ancillary and laboratory) are listed below for reference.    Significant Diagnostic Studies: Dg Chest 2 View  01/07/2014   CLINICAL DATA:  Near syncope; history of diabetes and congestive heart failure  EXAM: CHEST  2 VIEW  COMPARISON:  PA and lateral chest of November 22, 2013  FINDINGS: The lungs are adequately  inflated and clear. The heart and mediastinal structures are normal. The bony structures are unremarkable.  IMPRESSION: There is no acute cardiopulmonary disease.   Electronically Signed   By: David  [place]   On: 01/07/2014 15:55    Microbiology: No results found for this or any previous visit (from the past 240 hour(s)).   Labs: Basic Metabolic Panel:  Recent Labs Lab 01/07/14 1502 01/07/14 1955 01/08/14 0101 01/08/14 1105 01/09/14 0330  NA 135*  --  141 140 142  K 4.1  --  3.7 3.9 3.6*  CL 91*  --  98 98 101  CO2 23  --  28 27 27   GLUCOSE 548*  --  280* 283* 171*  BUN 13  --  13 12 12   CREATININE 0.88 0.73 0.78 0.69 0.73  CALCIUM 9.0  --  8.8 8.7 8.9   Liver Function Tests:  Recent Labs Lab 01/07/14 1502 01/08/14 0101  AST 27 20  ALT 28 16  ALKPHOS 116 81  BILITOT <0.2* 0.3  PROT 7.8 7.1  ALBUMIN 3.7 3.4*    Recent Labs Lab 01/07/14 1502  LIPASE 50   No results found for this basename: AMMONIA,  in the last 168 hours CBC:  Recent Labs Lab 01/07/14 1502 01/07/14 1955 01/08/14 0101  WBC 7.5 7.9 8.3  NEUTROABS  --   --  5.1  HGB 12.8* 12.8* 12.1*  HCT 38.1* 36.9* 35.9*  MCV 87.0 86.0 87.1  PLT 173 169 162   Cardiac Enzymes:  Recent Labs Lab 01/07/14 1502 01/07/14 1955 01/08/14 0101 01/08/14 0735  TROPONINI <0.30 <0.30 <0.30 <0.30   BNP: BNP (last 3 results)  Recent Labs  11/22/13 1005  11/23/13 0420 01/07/14 1955  PROBNP 533.6* 233.2* 235.1*   CBG:  Recent Labs Lab 01/08/14 0720 01/08/14 1151 01/08/14 1654 01/08/14 1959 01/09/14 0742  GLUCAP 245* 222* 168* 112* 170*   Signed:  Josephanthony Tindel L  [facility] 01/09/2014, 10:05 AM

## 2014-01-15 ENCOUNTER — Encounter (HOSPITAL_COMMUNITY): Payer: No Typology Code available for payment source

## 2014-01-20 ENCOUNTER — Telehealth: Payer: Self-pay | Admitting: Licensed Clinical Social Worker

## 2014-01-20 NOTE — Telephone Encounter (Signed)
CSW referred to follow up on insurance question. CSW contacted patient by phone who states he has [employer] through his wife who works for the [employer]. Patient denies any questions or concerns and will call CSW if needed. Lasandra Beech, LCSW [phone]

## 2014-01-21 NOTE — Progress Notes (Signed)
Patient ID: Mitchell [name], male   DOB: 03/11/1971, 43 y.o.   MRN: [id number]  [name]: [facility]  Pulmonologist: None  HPI:  Mitchell [name] is a 43 year old male with multiple medical problems including obesity, uncontrolled hypertension, uncontrolled diabetes, obstructive sleep apnea, bipolar disease/schizophrenia, CVA, nonobstructive coronary disease and systolic congestive heart failure secondary to nonischemic cardiomyopathy.  Admitted to Mitchell [name] 6/10 through 01/09/14 with uncontrolled diabetes. Started back on insulin. He was also referred to Mitchell [name] for teeth extractions. Bidil stopped due to suspected syncope. Discharge weight 276 pounds.   He returns for post hospital follow up. Denies SOB/PND/Orthopnea. Able to walk up steps. Taking insulin and following recommendations per PCP. Not weighing at home says he lost his scale. He has been referred to pulmonary but he didn't follow up. Says he would to schedule. Snores a lot.  Taking all medications. Trying to follow low diet and limits fluid intake to < 2 liters.   09/23/11: 2D echo checked with reduction in LVEF to 15-20% with mild to moderate mitral valve regurgitation ECHO 10/30/12 EF 20-25%  ECHO 11/22/13 EF 20-25%   Labs 01/09/14 Pro BNP 235  Labs 01/09/14 K 3.6 Creatinine 0.73   SH: Lives at home with wife and children. Does not smoke or drink alcohol. FH: Mom had heart diease  ROS: As per HPI otherwise negative  Past Medical History  Diagnosis Date  . Hypertension   . Hyperlipidemia   . Depression   . OSA (obstructive sleep apnea)     On CPAP; NON-COMPLIANT  . CVA (cerebral infarction)     No residual deficits  . Non-ischemic cardiomyopathy     No ischemia on myoview, showed EF of 42%. 2D echo showed EF 50-55% with diatolic dysfunction in 2009  . Post-cardiac injury syndrome     History of cardiac injury from blunt trauma  . PVCs (premature ventricular contractions)   . History of  colonic polyps   . Obesity   . Syncope     Recurrent, thought to be vasovagal. Also has h/o frequent PVCs.   . Chronic systolic heart failure 11/2010    a. NICM 4/14 EF 20-25%, TR mild  . SOB (shortness of breath)   . Stroke   . 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 Myocardial infarction   . Headache(784.0)   . Diabetes mellitus     TYPE II; UNCONTROLLED BY HEMOGLOBIN A1c; STABLE AS  PER DISCHARGE  . CHF (congestive heart failure)     Current Outpatient Prescriptions  Medication Sig Dispense Refill  . carvedilol (COREG) 25 MG tablet Take 1 tablet (25 mg total) by mouth 2 (two) times daily with a meal.  60 tablet  0  . digoxin (LANOXIN) 0.25 MG tablet Take 1 tablet (0.25 mg total) by mouth daily.  30 tablet  6  . furosemide (LASIX) 40 MG tablet Take 1 tablet (40 mg total) by mouth 2 (two) times daily.  60 tablet  0  . insulin aspart protamine- aspart (NOVOLOG MIX 70/30) (70-30) 100 UNIT/ML injection Inject 0.4 mLs (40 Units total) into the skin 2 (two) times daily with a meal.  10 mL  1  . losartan (COZAAR) 100 MG tablet Take 1 tablet (100 mg total) by mouth daily.  30 tablet  0  . magnesium oxide (MAG-OX) 400 (241.3 MG) MG tablet Take 1 tablet (400 mg total) by mouth daily.  30 tablet  0  . magnesium oxide (MAG-OX) 400  MG tablet Take 400 mg by mouth daily.      . QUEtiapine (SEROQUEL) 100 MG tablet Take 1 tablet (100 mg total) by mouth at bedtime.  30 tablet  0  . spironolactone (ALDACTONE) 50 MG tablet Take 1 tablet (50 mg total) by mouth daily.  30 tablet  0  . sertraline (ZOLOFT) 100 MG tablet Take 1 tablet (100 mg total) by mouth 2 (two) times daily.  30 tablet  0   No current facility-administered medications for this encounter.    Allergies  Allergen Reactions  . Nsaids Anaphylaxis    PHYSICAL EXAM: Filed Vitals:   01/22/14 0923  BP: 122/81  Pulse: 87  Weight: 288 lb (130.636 kg)  SpO2: 98%    General:  Well appearing. No respiratory difficulty HEENT: normal Neck:  supple. JVP flat Carotids 2+ bilat; no bruits. No lymphadenopathy or thryomegaly appreciated. Cor: PMI nondisplaced. Tachycardic & regular rhythm. No rubs, or murmurs. No  S3 Lungs: clear Abdomen: Obese, soft, nontender, distended. No hepatosplenomegaly. No bruits or masses. Good bowel sounds. Extremities: no cyanosis, clubbing, rash, trace edema Neuro: alert & oriented x 3, cranial nerves grossly intact. moves all 4 extremities w/o difficulty. Affect pleasant.    ASSESSMENT & PLAN:  1) Chronic systolic HF, NICM ECHO 12/2013 EF 20-25%  - NYHA II. Volume status stable.  Continue lasix 40 mg twice a day and spironolactone 50 mg daily. May need to cut back spiro if K rising with increased losartan.  - On goal carvedilol to 25 mg twice a day. Continue digoxin 0.25 mg daily  - Continue losartan 100 mg in am and add 50 mg in pm which will be goal  - - He continues to refuse ICD and understands he is at risk for SCD.  - Will get BMET and dig level today. Check BMET next week.  -Provided a scale and weight chart today.  -Reinforced the need and importance of daily weights, a low sodium diet, and fluid restriction (less than 2 L a day). Instructed to call the HF clinic if weight increases more than 3 lbs overnight or 5 lbs in a week.  - Follow up in 1 month   2) HTN-Stable. Continue current regimen and add 50 mg losartan in evening.  continue BB. ARB and Cleda Daub. 3) DM - per PCP Has appointment July 8th 4) Noncompliance- Stressed the importance of medication compliance  5) Snores- Refer to pulmonary for sleep study.  Mitchell [name] 9:32 AM

## 2014-01-22 ENCOUNTER — Ambulatory Visit (HOSPITAL_COMMUNITY)
Admission: RE | Admit: 2014-01-22 | Discharge: 2014-01-22 | Disposition: A | Discharge status: Home / Self Care | Payer: No Typology Code available for payment source | Source: Ambulatory Visit | Attending: Internal Medicine | Admitting: Internal Medicine

## 2014-01-22 VITALS — BP 122/81 | HR 87 | Wt 288.0 lb

## 2014-01-22 DIAGNOSIS — G4733 Obstructive sleep apnea (adult) (pediatric): Secondary | ICD-10-CM | POA: Insufficient documentation

## 2014-01-22 DIAGNOSIS — R0683 Snoring: Secondary | ICD-10-CM

## 2014-01-22 DIAGNOSIS — I252 Old myocardial infarction: Secondary | ICD-10-CM | POA: Insufficient documentation

## 2014-01-22 DIAGNOSIS — I10 Essential (primary) hypertension: Secondary | ICD-10-CM | POA: Insufficient documentation

## 2014-01-22 DIAGNOSIS — I509 Heart failure, unspecified: Secondary | ICD-10-CM | POA: Insufficient documentation

## 2014-01-22 DIAGNOSIS — I428 Other cardiomyopathies: Secondary | ICD-10-CM | POA: Insufficient documentation

## 2014-01-22 DIAGNOSIS — E785 Hyperlipidemia, unspecified: Secondary | ICD-10-CM | POA: Insufficient documentation

## 2014-01-22 DIAGNOSIS — IMO0001 Reserved for inherently not codable concepts without codable children: Secondary | ICD-10-CM

## 2014-01-22 DIAGNOSIS — Z9119 Patient's noncompliance with other medical treatment and regimen: Secondary | ICD-10-CM

## 2014-01-22 DIAGNOSIS — Z8659 Personal history of other mental and behavioral disorders: Secondary | ICD-10-CM | POA: Insufficient documentation

## 2014-01-22 DIAGNOSIS — R0989 Other specified symptoms and signs involving the circulatory and respiratory systems: Secondary | ICD-10-CM | POA: Insufficient documentation

## 2014-01-22 DIAGNOSIS — Z794 Long term (current) use of insulin: Secondary | ICD-10-CM | POA: Insufficient documentation

## 2014-01-22 DIAGNOSIS — I5022 Chronic systolic (congestive) heart failure: Secondary | ICD-10-CM | POA: Insufficient documentation

## 2014-01-22 DIAGNOSIS — Z8673 Personal history of transient ischemic attack (TIA), and cerebral infarction without residual deficits: Secondary | ICD-10-CM | POA: Insufficient documentation

## 2014-01-22 DIAGNOSIS — E669 Obesity, unspecified: Secondary | ICD-10-CM | POA: Insufficient documentation

## 2014-01-22 DIAGNOSIS — E118 Type 2 diabetes mellitus with unspecified complications: Secondary | ICD-10-CM

## 2014-01-22 DIAGNOSIS — E1165 Type 2 diabetes mellitus with hyperglycemia: Secondary | ICD-10-CM | POA: Insufficient documentation

## 2014-01-22 DIAGNOSIS — IMO0002 Reserved for concepts with insufficient information to code with codable children: Secondary | ICD-10-CM | POA: Insufficient documentation

## 2014-01-22 DIAGNOSIS — R0609 Other forms of dyspnea: Secondary | ICD-10-CM | POA: Insufficient documentation

## 2014-01-22 DIAGNOSIS — Z91199 Patient's noncompliance with other medical treatment and regimen due to unspecified reason: Secondary | ICD-10-CM | POA: Insufficient documentation

## 2014-01-22 LAB — BASIC METABOLIC PANEL
BUN: 12 mg/dL (ref 6–23)
CO2: 25 meq/L (ref 19–32)
Calcium: 8.7 mg/dL (ref 8.4–10.5)
Chloride: 100 mEq/L (ref 96–112)
Creatinine, Ser: 0.77 mg/dL (ref 0.50–1.35)
GFR calc Af Amer: >90 mL/min (ref >90)
Glucose, Bld: 234 mg/dL — ABNORMAL HIGH (ref 70–99)
Potassium: 4.6 mEq/L (ref 3.7–5.3)
Sodium: 142 mEq/L (ref 137–147)

## 2014-01-22 LAB — DIGOXIN LEVEL: Digoxin Level: 0.6 ng/mL — ABNORMAL LOW (ref 0.8–2.0)

## 2014-01-22 MED ORDER — LOSARTAN POTASSIUM 100 MG PO TABS: ORAL_TABLET | ORAL | Status: DC

## 2014-01-22 NOTE — Patient Instructions (Signed)
Follow next week for lab work   Follow up in 1 month   Take losartan 100 mg in am and 50 mg in pm  Do the following things EVERYDAY: 1) Weigh yourself in the morning before breakfast. Write it down and keep it in a log. 2) Take your medicines as prescribed 3) Eat low salt foods-Limit salt (sodium) to 2000 mg per day.  4) Stay as active as you can everyday 5) Limit all fluids for the day to less than 2 liters

## 2014-01-23 ENCOUNTER — Ambulatory Visit (HOSPITAL_COMMUNITY): Payer: Self-pay | Admitting: Dentistry

## 2014-01-23 ENCOUNTER — Encounter (INDEPENDENT_AMBULATORY_CARE_PROVIDER_SITE_OTHER): Payer: Self-pay

## 2014-01-23 ENCOUNTER — Encounter (HOSPITAL_COMMUNITY): Payer: Self-pay | Admitting: Dentistry

## 2014-01-23 VITALS — BP 164/104 | HR 92 | Temp 98.4°F

## 2014-01-23 DIAGNOSIS — K045 Chronic apical periodontitis: Secondary | ICD-10-CM

## 2014-01-23 DIAGNOSIS — M264 Malocclusion, unspecified: Secondary | ICD-10-CM

## 2014-01-23 DIAGNOSIS — K0401 Reversible pulpitis: Secondary | ICD-10-CM

## 2014-01-23 DIAGNOSIS — K036 Deposits [accretions] on teeth: Secondary | ICD-10-CM

## 2014-01-23 DIAGNOSIS — K029 Dental caries, unspecified: Secondary | ICD-10-CM

## 2014-01-23 DIAGNOSIS — K0530 Chronic periodontitis, unspecified: Secondary | ICD-10-CM

## 2014-01-23 DIAGNOSIS — K083 Retained dental root: Secondary | ICD-10-CM

## 2014-01-23 DIAGNOSIS — R682 Dry mouth, unspecified: Secondary | ICD-10-CM

## 2014-01-23 DIAGNOSIS — K08109 Complete loss of teeth, unspecified cause, unspecified class: Secondary | ICD-10-CM

## 2014-01-23 DIAGNOSIS — IMO0001 Reserved for inherently not codable concepts without codable children: Secondary | ICD-10-CM

## 2014-01-23 DIAGNOSIS — E1165 Type 2 diabetes mellitus with hyperglycemia: Principal | ICD-10-CM

## 2014-01-23 DIAGNOSIS — K117 Disturbances of salivary secretion: Secondary | ICD-10-CM

## 2014-01-23 MED ORDER — HYDROCODONE-ACETAMINOPHEN 5-325 MG PO TABS: 1.0000 | ORAL_TABLET | Freq: Four times a day (QID) | ORAL | Status: DC | PRN

## 2014-01-23 NOTE — Progress Notes (Signed)
DENTAL CONSULTATION  Date of Consultation:  01/23/2014 Patient Name:   Mitchell [name] Date of Birth:   07/11/1971 Medical Record Number: [id number]  VITALS: BP 164/104  Pulse 92  Temp(Src) 98.4 F (36.9 C) (Oral)   CHIEF COMPLAINT: Patient was referred for evaluation of toothache symptoms and poor dentition.  HPI: Mitchell [name] is a 43 year old male with history of recent hospital admission for diabetic ketoacidosis secondary to uncontrolled diabetes mellitus-type II. Patient was referred by Dr. Lendell CapriceSullivan for evaluation of poor dentition and to provide treatment as indicated. Patient is now seen as an outpatient to rule out dental infection that may affect the patient's systemic health and worsen the patient's diabetes and heart failure.  Patient currently is complaining of toothache symptoms from the lower right quadrant. Patient describes sharp, acute pain that last for hours at a time. Patient describes the pain at "15 out of 10" in intensity. This pain is relieved with Vicodin pain medication. Patient no longer has any Vicodin pain medication remaining. The pain has been bothering the patient for the past 2 weeks more acutely. Patient has been having toothache symptoms for over one year.  Patient was last seen by a dentist at [facility] approximately one year ago. Patient had an exam and radiographs. Patient was able to afford the ental treatment prescribed at that time. Patient is now interested in having all remaining teeth extracted.   PROBLEM LIST: Patient Active Problem List   Diagnosis Date Noted  . Noncompliance 01/22/2014  . Snores 01/22/2014  . DKA, type 2, not at goal 01/08/2014  . Dental caries, multiple 01/08/2014  . Syncope 01/07/2014  . Acute respiratory failure with hypoxia 11/25/2013  . CHF (congestive heart failure) 11/22/2013  . PNA (pneumonia) 11/22/2013  . CAP (community acquired pneumonia) 07/22/2013  . Community acquired bacterial pneumonia 07/22/2013  .  Chronic systolic heart failure 10/02/2011  . Erectile dysfunction 10/02/2011  . Acute on chronic systolic heart failure 09/23/2011  . OBSTRUCTIVE SLEEP APNEA 10/11/2007  . Type II diabetes mellitus, uncontrolled 10/10/2007  . HYPERLIPIDEMIA 10/10/2007  . HYPOKALEMIA 10/10/2007  . OBESITY 10/10/2007  . HYPERTENSION 10/10/2007  . PREMATURE VENTRICULAR CONTRACTIONS 10/10/2007  . SYNCOPE 10/10/2007  . COLONIC POLYPS, HX OF 10/10/2007    PMH: Past Medical History  Diagnosis Date  . Hypertension   . Hyperlipidemia   . Depression   . OSA (obstructive sleep apnea)     On CPAP; NON-COMPLIANT  . CVA (cerebral infarction)     No residual deficits  . Non-ischemic cardiomyopathy     No ischemia on myoview, showed EF of 42%. 2D echo showed EF 50-55% with diatolic dysfunction in 2009  . Post-cardiac injury syndrome     History of cardiac injury from blunt trauma  . PVCs (premature ventricular contractions)   . History of colonic polyps   . Obesity   . Syncope     Recurrent, thought to be vasovagal. Also has h/o frequent PVCs.   . Chronic systolic heart failure 11/2010    a. NICM 4/14 EF 20-25%, TR mild  . SOB (shortness of breath)   . Stroke   . 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 Myocardial infarction   . Headache(784.0)   . Diabetes mellitus     TYPE II; UNCONTROLLED BY HEMOGLOBIN A1c; STABLE AS  PER DISCHARGE  . CHF (congestive heart failure)     PSH: Past Surgical History  Procedure Laterality Date  . Cardiac catheterization  12/19/10  DIFFUSE NONOBSTRUCTIVE CAD; NONISCHEMIC CARDIOMYOPATHY; LEFT VENTRICULAR ANGIOGRAM WAS PERFORMED SECONDARY TO  ELEVATED LEFT VENTRICULAR FILLING PRESSURES  . Right thumb      ALLERGIES: Allergies  Allergen Reactions  . Nsaids Anaphylaxis    MEDICATIONS: Current Outpatient Prescriptions  Medication Sig Dispense Refill  . carvedilol (COREG) 25 MG tablet Take 1 tablet (25 mg total) by mouth 2 (two) times daily with a meal.  60 tablet  0   . digoxin (LANOXIN) 0.25 MG tablet Take 0.25 mg by mouth daily.      . furosemide (LASIX) 40 MG tablet Take 1 tablet (40 mg total) by mouth 2 (two) times daily.  60 tablet  0  . insulin aspart protamine- aspart (NOVOLOG MIX 70/30) (70-30) 100 UNIT/ML injection Inject 0.4 mLs (40 Units total) into the skin 2 (two) times daily with a meal.  10 mL  1  . losartan (COZAAR) 100 MG tablet Take 100 mg in am and 50 mg in evening  45 tablet  0  . magnesium oxide (MAG-OX) 400 (241.3 MG) MG tablet Take 1 tablet (400 mg total) by mouth daily.  30 tablet  0  . magnesium oxide (MAG-OX) 400 MG tablet Take 400 mg by mouth daily.      . QUEtiapine (SEROQUEL) 100 MG tablet Take 1 tablet (100 mg total) by mouth at bedtime.  30 tablet  0  . spironolactone (ALDACTONE) 50 MG tablet Take 1 tablet (50 mg total) by mouth daily.  30 tablet  0  . digoxin (LANOXIN) 0.25 MG tablet Take 1 tablet (0.25 mg total) by mouth daily.  30 tablet  6  . HYDROcodone-acetaminophen (NORCO) 5-325 MG per tablet Take 1-2 tablets by mouth every 6 (six) hours as needed for moderate pain.  30 tablet  0  . sertraline (ZOLOFT) 100 MG tablet Take 1 tablet (100 mg total) by mouth 2 (two) times daily.  30 tablet  0   No current facility-administered medications for this visit.    LABS: Lab Results  Component Value Date   WBC 8.3 01/08/2014   HGB 12.1* 01/08/2014   HCT 35.9* 01/08/2014   MCV 87.1 01/08/2014   PLT 162 01/08/2014      Component Value Date/Time   NA 142 01/22/2014 0945   K 4.6 01/22/2014 0945   CL 100 01/22/2014 0945   CO2 25 01/22/2014 0945   GLUCOSE 234* 01/22/2014 0945   BUN 12 01/22/2014 0945   CREATININE 0.77 01/22/2014 0945   CALCIUM 8.7 01/22/2014 0945   GFRNONAA >90 01/22/2014 0945   GFRAA >90 01/22/2014 0945   Lab Results  Component Value Date   INR 1.07 12/19/2010   INR 1.0 09/19/2007   INR 1.0 06/08/2007   No results found for this basename: PTT    SOCIAL HISTORY: History   Social History  . Marital Status:  Married    Spouse Name: N/A    Number of Children: 5  . Years of Education: N/A   Occupational History  . Mortician    Social History Main Topics  . Smoking status: Never Smoker   . Smokeless tobacco: Never Used  . Alcohol Use: No     Comment: Never used  . Drug Use: No     Comment: Never sued  . Sexual Activity: Yes   Other Topics Concern  . 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    FAMILY HISTORY: Family History  Problem Relation Age of Onset  . Heart disease Mother     MI  . Heart failure Mother   . Diabetes Mother     ALSO IN MOST OF HIS SIBLINGS; 2 UNLCES HAVE ALSO PASSED AWAY FROM DM  . Cardiomyopathy Mother      REVIEW OF SYSTEMS: Reviewed with patient and positive as above.   DENTAL HISTORY: CHIEF COMPLAINT: Patient was referred for evaluation of toothache symptoms and poor dentition.  HPI: ARVIND MEXICANO is a 43 year old male with history of recent hospital admission for diabetic ketoacidosis secondary to uncontrolled diabetes mellitus-type II. Patient was referred by Dr. Lendell Caprice for evaluation of poor dentition and to provide treatment as indicated. Patient is now seen as an outpatient to rule out dental infection that may affect the patient's systemic health and worsen the patient's diabetes and heart failure.  Patient currently is complaining of toothache symptoms from the lower right quadrant. Patient describes sharp, acute pain that last for hours at a time. Patient describes the pain at "15 out of 10" in intensity. This pain is relieved with Vicodin pain medication. Patient no longer has any Vicodin pain medication remaining. The pain has been bothering the patient for the past 2 weeks more acutely. Patient has been having toothache symptoms for over one year.  Patient was last seen by a dentist at  [facility] approximately one year ago. Patient had an exam and radiographs. Patient was able to afford the ental treatment prescribed at that time. Patient is now interested in having all remaining teeth extracted.   DENTAL EXAMINATION: GENERAL: Patient is a well-developed, obese male in no acute distress. HEAD AND NECK: There is no submandibular lymphadenopathy. The patient denies acute TMJ symptoms. INTRAORAL EXAM: Patient has incipient xerostomia. There is no evidence of oral abscess formation. DENTITION: Patient is missing tooth numbers 18, 19, and 30. There are retained roots in the area tooth numbers 1, 2, 3, 7, 8, 9, 10, 14, 15, 16, 22, 23, 24. PERIODONTAL: Patient has chronic periodontitis with plaque and calculus accumulations, selective areas of gingival recession and some incipient tooth mobility. DENTAL CARIES/SUBOPTIMAL RESTORATIONS: Patient has rampant dental caries affecting multiple teeth as per dental charting form. ENDODONTIC: Patient with a history of acute pulpitis symptoms. Patient has multiple areas of periapical pathology and radiolucency. CROWN AND BRIDGE: There are no crown or bridge restorations. PROSTHODONTIC: Patient has no partial dentures. OCCLUSION: Patient has a poor occlusal scheme secondary to multiple missing teeth, multiple retained root segments, supra-eruption and drifting of the unopposed teeth into the edentulous areas, and lack of replacement of missing teeth with dental prostheses.  RADIOGRAPHIC INTERPRETATION: An orthopantogram was taken and supplemented with a full series of dental radiographs. There are multiple missing teeth. There multiple retained root segments. There are rampant dental caries. There are multiple areas of periapical pathology and radiolucency. There is supra-eruption and drifting of the unopposed teeth into the edentulous areas. There are multiple malposition teeth noted. There are multiple diastemas noted.    ASSESSMENTS: 1.  Diabetes mellitus-type II-uncontrolled 2. Heart failure 3. Acute pulpitis symptoms 4. Chronic apical periodontitis 5. Rampant dental caries 6. Multiple missing teeth 7. Multiple retained root segments 8. Multiple diastemas 9. Multiple malpositioned teeth 10. Supra-eruption and drifting of the unopposed teeth into the edentulous areas 11. Malocclusion 12. Risk for complications up to and including death with the anticipated invasive dental  procedures secondary to heart failure. 13. Risk for delayed healing secondary to uncontrolled diabetes mellitus-type II   PLAN/RECOMMENDATIONS: 1. I discussed the risks, benefits, and complications of various treatment options with the patient in relationship to  his medical and dental conditions, history of uncontrolled diabetes mellitus-type II, and heart failure. We discussed various treatment options to include no treatment, multiple extractions with alveoloplasty, pre-prosthetic surgery as indicated, periodontal therapy, dental restorations, root canal therapy, crown and bridge therapy, implant therapy, and replacement of missing teeth as indicated. The patient currently wishes to proceed with  extraction of all remaining teeth with alveoloplasty and pre-prosthetic surgery as indicated in the operating room with general anesthesia. Patient will then follow up with the dentist of his choice for fabrication of upper and lower complete dentures after adequate healing. The patient will need to be cleared by Dr. Gala Romney for the anticipated operating room procedure with general anesthesia. The patient most likely will need to be kept overnight secondary to his history of sleep apnea and CPAP noncompliance.   2. Discussion of findings with medical team and coordination of future medical and dental care as needed.  I spent 90 minutes face to face with patient and more than 50% of time was spent in counseling and /or coordination of care.   Charlynne Pander,  DDS

## 2014-01-23 NOTE — Patient Instructions (Signed)
The patient was given a prescription for hydrocodone/acetaminophen 5/325. Patient is to take one to 2 tablets every 6 hours as needed for pain. No refills. I will need to discuss plan of care with Dr. Gala Romney to ensure the ability to proceed with extraction of remaining teeth with alveoloplasty in the OR with general anesthesia. I also will need to see Dr. Gala Romney needs to see the patient prior to the anticipated scheduling of the operating room procedure. Dr. Kristin Bruins

## 2014-01-26 ENCOUNTER — Encounter (HOSPITAL_COMMUNITY): Payer: Self-pay

## 2014-01-26 ENCOUNTER — Encounter (HOSPITAL_COMMUNITY)
Admission: RE | Admit: 2014-01-26 | Discharge: 2014-01-26 | Disposition: A | Discharge status: Home / Self Care | Payer: No Typology Code available for payment source | Source: Ambulatory Visit | Attending: Dentistry | Admitting: Dentistry

## 2014-01-26 ENCOUNTER — Other Ambulatory Visit (HOSPITAL_COMMUNITY): Payer: Self-pay | Admitting: Dentistry

## 2014-01-26 HISTORY — DX: Bipolar disorder, unspecified: F31.9

## 2014-01-26 HISTORY — DX: Schizophrenia, unspecified: F20.9

## 2014-01-26 HISTORY — DX: Pneumonia, unspecified organism: J18.9

## 2014-01-26 LAB — CBC
HCT: 40.8 % (ref 39.0–52.0)
Hemoglobin: 13.7 g/dL (ref 13.0–17.0)
MCH: 30 pg (ref 26.0–34.0)
MCHC: 33.6 g/dL (ref 30.0–36.0)
MCV: 89.3 fL (ref 78.0–100.0)
Platelets: 205 10*3/uL (ref 150–400)
RBC: 4.57 MIL/uL (ref 4.22–5.81)
RDW: 12.4 % (ref 11.5–15.5)
WBC: 10.2 10*3/uL (ref 4.0–10.5)

## 2014-01-26 MED ORDER — DEXTROSE 5 % IV SOLN
3.0000 g | Freq: Once | INTRAVENOUS | Status: AC
  Administered 2014-01-27: 3 g via INTRAVENOUS
  Filled 2014-01-26: qty 3000

## 2014-01-26 NOTE — Pre-Procedure Instructions (Signed)
COBAIN KRIKORIAN  01/26/2014   Your procedure is scheduled on:  Tuesday, January 23, 2014 at 7:30 AM.   Report to [facility] Entrance "A" Admitting Office at 5:30 AM.   Call this number if you have problems the morning of surgery: [phone]   Remember:   Do not eat food or drink liquids after midnight tonight.   Take these medicines the morning of surgery with A SIP OF WATER: carvedilol (COREG), digoxin (LANOXIN), sertraline (ZOLOFT), HYDROcodone-acetaminophen (NORCO) - if needed.  Do not take any of your diabetic medicines the morning of surgery.    Do not wear jewelry.  Do not wear lotions, powders, or cologne. You may wear deodorant.  Men may shave face and neck.  Do not bring valuables to the hospital.  [facility] is not responsible                  for any belongings or valuables.               Contacts, dentures or bridgework may not be worn into surgery.  Leave suitcase in the car. After surgery it may be brought to your room.  For patients admitted to the hospital, discharge time is determined by your                treatment team.            Special Instructions: [place] [facility] - Preparing for Surgery  Before surgery, you can play an important role.  Because skin is not sterile, your skin needs to be as free of germs as possible.  You can reduce the number of germs on you skin by washing with CHG (chlorahexidine gluconate) soap before surgery.  CHG is an antiseptic cleaner which kills germs and bonds with the skin to continue killing germs even after washing.  Please DO NOT use if you have an allergy to CHG or antibacterial soaps.  If your skin becomes reddened/irritated stop using the CHG and inform your nurse when you arrive at Short Stay.  Do not shave (including legs and underarms) for at least 48 hours prior to the first CHG shower.  You may shave your face.  Please follow these instructions carefully:   1.  Shower with CHG Soap the night before surgery and  the                                morning of Surgery.  2.  If you choose to wash your hair, wash your hair first as usual with your       normal shampoo.  3.  After you shampoo, rinse your hair and body thoroughly to remove the                      Shampoo.  4.  Use CHG as you would any other liquid soap.  You can apply chg directly       to the skin and wash gently with scrungie or a clean washcloth.  5.  Apply the CHG Soap to your body ONLY FROM THE NECK DOWN.        Do not use on open wounds or open sores.  Avoid contact with your eyes, ears, mouth and genitals (private parts).  Wash genitals (private parts) with your normal soap.  6.  Wash thoroughly, paying special attention to the area where your surgery  will be performed.  7.  Thoroughly rinse your body with warm water from the neck down.  8.  DO NOT shower/wash with your normal soap after using and rinsing off       the CHG Soap.  9.  Pat yourself dry with a clean towel.            10.  Wear clean pajamas.            11.  Place clean sheets on your bed the night of your first shower and do not        sleep with pets.  Day of Surgery  Do not apply any lotions the morning of surgery.  Please wear clean clothes to the hospital/surgery center.     Please read over the following fact sheets that you were given: Pain Booklet, Coughing and Deep Breathing and Surgical Site Infection Prevention

## 2014-01-26 NOTE — Progress Notes (Signed)
Spoke with Dr. Kristin Bruins about cardiac clearance. He states that he has spoken with Dr. Teressa Lower and Dr. Teressa Lower states that pt is ok for general anesthesia from his standpoint.

## 2014-01-26 NOTE — Progress Notes (Signed)
Pt states he is feeling so much better now that he is on insulin. States that his fasting blood sugar yesterday was 189. Pt denies any chest pain, sob. Saw Tonye Becket, NP in CHF clinic 01/22/14 and has an appt for this Thursday, 01/29/14. Now has a therapeutic diabetes dog and has been walking daily.

## 2014-01-26 NOTE — Progress Notes (Signed)
Anesthesia Chart Review:  Patient is a 43 year old male scheduled for multiple teeth extractions with alveoloplasty and pre-prosthetic surgery as necessary on 01/27/14 by Dr. Kristin Bruins.  History includes non-obstructive CAD by 2012 cath, chronic systolic CHF, non-ischemic cardiomyopathy (has refused AICD in the past and has had issues with medical complicance), cardiac injury from blunt trauma (reported a history of MI at age 85 while playing football), OSA with CPAP non-compliance, HTN, DM2, recurrent syncope thought to be vasovagal (Bidil discontinued), PVCs, reported CVA with no residual (had normal brain MRI 2008 and 2009), headaches, depression (cardiology notes also mention bipolar disease/schizophrenia), HLD. BMI is consistent with obesity.  PCP is Dr. Caffie Damme with [facility]. Cardiologist is Dr. Gala Romney.  Dr. Kristin Bruins reports that he has already spoken with Dr. Gala Romney who felt patient was okay to proceed with procedure under general anesthesia. Last CHF clinic visit was 01/22/14 with Filbert Schilder, NP. According to his PAT RN, patient reported medication compliance and no new CV/CHF symptoms since his last cardiology visit.  EKG on 01/07/14 showed: SR, LAFB, borderline prolonged QT.  Echo on 11/24/13 showed:  - Left ventricle: The cavity size was moderately dilated. Systolic function was severely reduced. The estimated ejection fraction was in the range of 20% to 25%. Diffuse hypokinesis. There is akinesis of the entire anteroseptal myocardium. - Mitral valve: Mild regurgitation. - Left atrium: The atrium was moderately dilated. - Right ventricle: The cavity size was mildly dilated. Wall thickness was normal. - Right atrium: The atrium was mildly dilated. - Pulmonic valve: Mild regurgitation. - Tricuspid valve: Mild regurgitation. Impressions: Compared to the prior study, there has been no significant interval change.  Cardiac cath on 12/19/10 showed: 1. Diffuse nonobstructive coronary  artery disease (40% mid LAD, very small caliber at the LAD apex with diffuse 60%. Two very small caliber DIAG branches with diffuse 70% too small for PCI. Moderate to large CX with large bifurcating OM1 branch with superior branch 40% and inferior branch 60% but was very small caliber. Remainder of the AV groove CX with mild plaque. RCA mild plaque but no obstructive lesions.). 2. Nonischemic cardiomyopathy.  CXR on 01/07/14 showed: No acute cardiopulmonary disease.  BMET from 01/22/14 and CBC from today noted. He will get a fasting CBG on arrival. Fasting glucose was reported as 189 yesterday.  He has a therapeutic diabetes dog and has been walking daily.  Patient with recent cardiology follow-up.  Dr. Kristin Bruins reported communication with patient's cardiologist regarding plans for surgery.  If no acute changes then I would anticipate that he could proceed as planned.  Velna [facility] Short Stay Center/Anesthesiology Phone [phone] 01/26/2014 2:23 PM

## 2014-01-27 ENCOUNTER — Observation Stay (HOSPITAL_COMMUNITY)
Admission: RE | Admit: 2014-01-27 | Discharge: 2014-01-28 | Disposition: A | Discharge status: Home / Self Care | Payer: No Typology Code available for payment source | Source: Ambulatory Visit | Attending: Internal Medicine | Admitting: Internal Medicine

## 2014-01-27 ENCOUNTER — Encounter (HOSPITAL_COMMUNITY): Payer: No Typology Code available for payment source | Admitting: Vascular Surgery

## 2014-01-27 ENCOUNTER — Ambulatory Visit (HOSPITAL_COMMUNITY): Payer: No Typology Code available for payment source | Admitting: Anesthesiology

## 2014-01-27 ENCOUNTER — Encounter (HOSPITAL_COMMUNITY): Payer: Self-pay | Admitting: Anesthesiology

## 2014-01-27 ENCOUNTER — Other Ambulatory Visit (HOSPITAL_COMMUNITY): Payer: Self-pay | Admitting: Adult Health

## 2014-01-27 ENCOUNTER — Encounter (HOSPITAL_COMMUNITY)
Admission: RE | Disposition: A | Discharge status: Home / Self Care | Payer: Self-pay | Source: Ambulatory Visit | Attending: Internal Medicine

## 2014-01-27 DIAGNOSIS — K029 Dental caries, unspecified: Secondary | ICD-10-CM

## 2014-01-27 DIAGNOSIS — I428 Other cardiomyopathies: Secondary | ICD-10-CM | POA: Insufficient documentation

## 2014-01-27 DIAGNOSIS — I252 Old myocardial infarction: Secondary | ICD-10-CM | POA: Insufficient documentation

## 2014-01-27 DIAGNOSIS — K045 Chronic apical periodontitis: Secondary | ICD-10-CM | POA: Diagnosis present

## 2014-01-27 DIAGNOSIS — I5022 Chronic systolic (congestive) heart failure: Secondary | ICD-10-CM

## 2014-01-27 DIAGNOSIS — K083 Retained dental root: Secondary | ICD-10-CM | POA: Diagnosis present

## 2014-01-27 DIAGNOSIS — IMO0001 Reserved for inherently not codable concepts without codable children: Secondary | ICD-10-CM | POA: Insufficient documentation

## 2014-01-27 DIAGNOSIS — E1165 Type 2 diabetes mellitus with hyperglycemia: Secondary | ICD-10-CM

## 2014-01-27 DIAGNOSIS — Z79899 Other long term (current) drug therapy: Secondary | ICD-10-CM | POA: Insufficient documentation

## 2014-01-27 DIAGNOSIS — Z794 Long term (current) use of insulin: Secondary | ICD-10-CM | POA: Insufficient documentation

## 2014-01-27 DIAGNOSIS — Z01812 Encounter for preprocedural laboratory examination: Secondary | ICD-10-CM | POA: Insufficient documentation

## 2014-01-27 DIAGNOSIS — K0401 Reversible pulpitis: Secondary | ICD-10-CM

## 2014-01-27 DIAGNOSIS — K0530 Chronic periodontitis, unspecified: Secondary | ICD-10-CM | POA: Diagnosis present

## 2014-01-27 DIAGNOSIS — G4733 Obstructive sleep apnea (adult) (pediatric): Secondary | ICD-10-CM | POA: Insufficient documentation

## 2014-01-27 DIAGNOSIS — E785 Hyperlipidemia, unspecified: Secondary | ICD-10-CM | POA: Insufficient documentation

## 2014-01-27 DIAGNOSIS — I10 Essential (primary) hypertension: Secondary | ICD-10-CM | POA: Insufficient documentation

## 2014-01-27 DIAGNOSIS — Z6838 Body mass index (BMI) 38.0-38.9, adult: Secondary | ICD-10-CM | POA: Insufficient documentation

## 2014-01-27 DIAGNOSIS — I2510 Atherosclerotic heart disease of native coronary artery without angina pectoris: Secondary | ICD-10-CM | POA: Insufficient documentation

## 2014-01-27 DIAGNOSIS — Z8673 Personal history of transient ischemic attack (TIA), and cerebral infarction without residual deficits: Secondary | ICD-10-CM | POA: Insufficient documentation

## 2014-01-27 DIAGNOSIS — F319 Bipolar disorder, unspecified: Secondary | ICD-10-CM | POA: Insufficient documentation

## 2014-01-27 DIAGNOSIS — E669 Obesity, unspecified: Secondary | ICD-10-CM | POA: Insufficient documentation

## 2014-01-27 DIAGNOSIS — I509 Heart failure, unspecified: Secondary | ICD-10-CM | POA: Diagnosis present

## 2014-01-27 DIAGNOSIS — F209 Schizophrenia, unspecified: Secondary | ICD-10-CM | POA: Insufficient documentation

## 2014-01-27 HISTORY — PX: MULTIPLE EXTRACTIONS WITH ALVEOLOPLASTY: SHX5342

## 2014-01-27 LAB — CBC
HEMATOCRIT: 38.3 % — AB (ref 39.0–52.0)
Hemoglobin: 12.6 g/dL — ABNORMAL LOW (ref 13.0–17.0)
MCH: 29.4 pg (ref 26.0–34.0)
MCHC: 32.9 g/dL (ref 30.0–36.0)
MCV: 89.3 fL (ref 78.0–100.0)
PLATELETS: 155 10*3/uL (ref 150–400)
RBC: 4.29 MIL/uL (ref 4.22–5.81)
RDW: 12.6 % (ref 11.5–15.5)
WBC: 14.2 10*3/uL — ABNORMAL HIGH (ref 4.0–10.5)

## 2014-01-27 LAB — GLUCOSE, CAPILLARY
GLUCOSE-CAPILLARY: 321 mg/dL — AB (ref 70–99)
GLUCOSE-CAPILLARY: 268 mg/dL — AB (ref 70–99)
GLUCOSE-CAPILLARY: 259 mg/dL — AB (ref 70–99)
Glucose-Capillary: 266 mg/dL — ABNORMAL HIGH (ref 70–99)
Glucose-Capillary: 251 mg/dL — ABNORMAL HIGH (ref 70–99)
Glucose-Capillary: 216 mg/dL — ABNORMAL HIGH (ref 70–99)

## 2014-01-27 LAB — CREATININE, SERUM
Creatinine, Ser: 1.01 mg/dL (ref 0.50–1.35)
GFR calc Af Amer: >90 mL/min (ref >90)
GFR, EST NON AFRICAN AMERICAN: 88 mL/min — AB (ref >90)

## 2014-01-27 SURGERY — MULTIPLE EXTRACTION WITH ALVEOLOPLASTY
Anesthesia: General | Site: Mouth

## 2014-01-27 MED ORDER — SODIUM CHLORIDE 0.9 % IJ SOLN
3.0000 mL | Freq: Two times a day (BID) | INTRAMUSCULAR | Status: DC
  Administered 2014-01-28: 3 mL via INTRAVENOUS

## 2014-01-27 MED ORDER — LIDOCAINE HCL (CARDIAC) 20 MG/ML IV SOLN
INTRAVENOUS | Status: DC | PRN
  Administered 2014-01-27: 100 mg via INTRAVENOUS

## 2014-01-27 MED ORDER — SODIUM CHLORIDE 0.9 % IV SOLN
10.0000 mg | INTRAVENOUS | Status: DC | PRN
  Administered 2014-01-27: 20 ug/min via INTRAVENOUS

## 2014-01-27 MED ORDER — MAGNESIUM OXIDE 400 (241.3 MG) MG PO TABS
400.0000 mg | ORAL_TABLET | Freq: Every day | ORAL | Status: DC
  Administered 2014-01-28: 400 mg via ORAL
  Filled 2014-01-27: qty 1

## 2014-01-27 MED ORDER — INSULIN ASPART 100 UNIT/ML ~~LOC~~ SOLN
SUBCUTANEOUS | Status: AC
  Filled 2014-01-27: qty 1

## 2014-01-27 MED ORDER — OXYCODONE HCL 5 MG PO TABS: 5.0000 mg | ORAL_TABLET | Freq: Once | ORAL | Status: DC | PRN

## 2014-01-27 MED ORDER — LIDOCAINE-EPINEPHRINE 2 %-1:100000 IJ SOLN
INTRAMUSCULAR | Status: AC
  Filled 2014-01-27: qty 10.2

## 2014-01-27 MED ORDER — PHENYLEPHRINE 40 MCG/ML (10ML) SYRINGE FOR IV PUSH (FOR BLOOD PRESSURE SUPPORT)
PREFILLED_SYRINGE | INTRAVENOUS | Status: AC
  Filled 2014-01-27: qty 10

## 2014-01-27 MED ORDER — OXYMETAZOLINE HCL 0.05 % NA SOLN
NASAL | Status: AC
Start: 2014-01-27 — End: 2014-01-27
  Filled 2014-01-27: qty 15

## 2014-01-27 MED ORDER — OXYCODONE HCL 5 MG/5ML PO SOLN: 5.0000 mg | Freq: Once | ORAL | Status: DC | PRN

## 2014-01-27 MED ORDER — PHENYLEPHRINE HCL 10 MG/ML IJ SOLN
INTRAMUSCULAR | Status: DC | PRN
  Administered 2014-01-27 (×2): 80 ug via INTRAVENOUS
  Administered 2014-01-27: 40 ug via INTRAVENOUS

## 2014-01-27 MED ORDER — NEOSTIGMINE METHYLSULFATE 10 MG/10ML IV SOLN
INTRAVENOUS | Status: DC | PRN
  Administered 2014-01-27: 3 mg via INTRAVENOUS

## 2014-01-27 MED ORDER — ENOXAPARIN SODIUM 40 MG/0.4ML ~~LOC~~ SOLN
40.0000 mg | SUBCUTANEOUS | Status: DC
  Administered 2014-01-27: 40 mg via SUBCUTANEOUS
  Filled 2014-01-27 (×2): qty 0.4

## 2014-01-27 MED ORDER — LOSARTAN POTASSIUM 50 MG PO TABS
100.0000 mg | ORAL_TABLET | Freq: Every day | ORAL | Status: DC
  Administered 2014-01-28: 100 mg via ORAL
  Filled 2014-01-27 (×2): qty 2

## 2014-01-27 MED ORDER — MIDAZOLAM HCL 5 MG/5ML IJ SOLN
INTRAMUSCULAR | Status: DC | PRN
  Administered 2014-01-27: 2 mg via INTRAVENOUS

## 2014-01-27 MED ORDER — BUPIVACAINE-EPINEPHRINE (PF) 0.5% -1:200000 IJ SOLN
INTRAMUSCULAR | Status: AC
  Filled 2014-01-27: qty 3.6

## 2014-01-27 MED ORDER — BUPIVACAINE-EPINEPHRINE 0.5% -1:200000 IJ SOLN
INTRAMUSCULAR | Status: DC | PRN
  Administered 2014-01-27: 3.6 mL

## 2014-01-27 MED ORDER — GLYCOPYRROLATE 0.2 MG/ML IJ SOLN
INTRAMUSCULAR | Status: AC
  Filled 2014-01-27: qty 2

## 2014-01-27 MED ORDER — MIDAZOLAM HCL 2 MG/2ML IJ SOLN
INTRAMUSCULAR | Status: AC
  Filled 2014-01-27: qty 2

## 2014-01-27 MED ORDER — LACTATED RINGERS IV SOLN
INTRAVENOUS | Status: DC | PRN
  Administered 2014-01-27 (×2): via INTRAVENOUS

## 2014-01-27 MED ORDER — INSULIN ASPART PROT & ASPART (70-30 MIX) 100 UNIT/ML ~~LOC~~ SUSP
40.0000 [IU] | Freq: Two times a day (BID) | SUBCUTANEOUS | Status: DC
  Administered 2014-01-27: 40 [IU] via SUBCUTANEOUS
  Filled 2014-01-27: qty 10

## 2014-01-27 MED ORDER — OXYMETAZOLINE HCL 0.05 % NA SOLN
NASAL | Status: AC
  Filled 2014-01-27: qty 15

## 2014-01-27 MED ORDER — SODIUM CHLORIDE 0.9 % IV SOLN: 250.0000 mL | INTRAVENOUS | Status: DC | PRN

## 2014-01-27 MED ORDER — METOCLOPRAMIDE HCL 5 MG/ML IJ SOLN: 10.0000 mg | Freq: Once | INTRAMUSCULAR | Status: DC | PRN

## 2014-01-27 MED ORDER — NEOSTIGMINE METHYLSULFATE 10 MG/10ML IV SOLN
INTRAVENOUS | Status: AC
  Filled 2014-01-27: qty 1

## 2014-01-27 MED ORDER — SERTRALINE HCL 100 MG PO TABS
100.0000 mg | ORAL_TABLET | Freq: Two times a day (BID) | ORAL | Status: DC
  Administered 2014-01-28: 100 mg via ORAL
  Filled 2014-01-27 (×3): qty 1

## 2014-01-27 MED ORDER — INSULIN ASPART 100 UNIT/ML ~~LOC~~ SOLN
SUBCUTANEOUS | Status: DC | PRN
  Administered 2014-01-27: 10 [IU] via SUBCUTANEOUS

## 2014-01-27 MED ORDER — 0.9 % SODIUM CHLORIDE (POUR BTL) OPTIME
TOPICAL | Status: DC | PRN
  Administered 2014-01-27 (×2): 1000 mL

## 2014-01-27 MED ORDER — LACTATED RINGERS IV SOLN: INTRAVENOUS | Status: DC

## 2014-01-27 MED ORDER — QUETIAPINE FUMARATE 100 MG PO TABS
100.0000 mg | ORAL_TABLET | Freq: Every day | ORAL | Status: DC
  Filled 2014-01-27 (×2): qty 1

## 2014-01-27 MED ORDER — ARTIFICIAL TEARS OP OINT
TOPICAL_OINTMENT | OPHTHALMIC | Status: DC | PRN
  Administered 2014-01-27: 1 via OPHTHALMIC

## 2014-01-27 MED ORDER — ACETAMINOPHEN 325 MG PO TABS: 650.0000 mg | ORAL_TABLET | ORAL | Status: DC | PRN

## 2014-01-27 MED ORDER — SODIUM CHLORIDE 0.9 % IJ SOLN: 3.0000 mL | INTRAMUSCULAR | Status: DC | PRN

## 2014-01-27 MED ORDER — ONDANSETRON HCL 4 MG/2ML IJ SOLN
INTRAMUSCULAR | Status: DC | PRN
Start: 2014-01-27 — End: 2014-01-27
  Administered 2014-01-27: 4 mg via INTRAVENOUS

## 2014-01-27 MED ORDER — INSULIN ASPART 100 UNIT/ML ~~LOC~~ SOLN
10.0000 [IU] | Freq: Once | SUBCUTANEOUS | Status: AC
  Administered 2014-01-27: 10 [IU] via SUBCUTANEOUS

## 2014-01-27 MED ORDER — METOCLOPRAMIDE HCL 5 MG/ML IJ SOLN
INTRAMUSCULAR | Status: DC | PRN
  Administered 2014-01-27: 10 mg via INTRAVENOUS

## 2014-01-27 MED ORDER — FENTANYL CITRATE 0.05 MG/ML IJ SOLN: 25.0000 ug | INTRAMUSCULAR | Status: DC | PRN

## 2014-01-27 MED ORDER — ROCURONIUM BROMIDE 100 MG/10ML IV SOLN
INTRAVENOUS | Status: DC | PRN
  Administered 2014-01-27: 50 mg via INTRAVENOUS

## 2014-01-27 MED ORDER — PROPOFOL 10 MG/ML IV BOLUS
INTRAVENOUS | Status: AC
  Filled 2014-01-27: qty 20

## 2014-01-27 MED ORDER — FENTANYL CITRATE 0.05 MG/ML IJ SOLN
INTRAMUSCULAR | Status: AC
  Filled 2014-01-27: qty 5

## 2014-01-27 MED ORDER — LIDOCAINE-EPINEPHRINE 2 %-1:100000 IJ SOLN
INTRAMUSCULAR | Status: DC | PRN
  Administered 2014-01-27: 10.2 mL

## 2014-01-27 MED ORDER — METOCLOPRAMIDE HCL 5 MG/ML IJ SOLN
INTRAMUSCULAR | Status: AC
  Filled 2014-01-27: qty 2

## 2014-01-27 MED ORDER — PROPOFOL 10 MG/ML IV BOLUS
INTRAVENOUS | Status: DC | PRN
  Administered 2014-01-27: 160 mg via INTRAVENOUS

## 2014-01-27 MED ORDER — ONDANSETRON HCL 4 MG/2ML IJ SOLN
4.0000 mg | Freq: Four times a day (QID) | INTRAMUSCULAR | Status: DC | PRN
  Administered 2014-01-27 – 2014-01-28 (×2): 4 mg via INTRAVENOUS
  Filled 2014-01-27 (×2): qty 2

## 2014-01-27 MED ORDER — GLYCOPYRROLATE 0.2 MG/ML IJ SOLN
INTRAMUSCULAR | Status: DC | PRN
  Administered 2014-01-27: 0.4 mg via INTRAVENOUS

## 2014-01-27 MED ORDER — FENTANYL CITRATE 0.05 MG/ML IJ SOLN
INTRAMUSCULAR | Status: DC | PRN
  Administered 2014-01-27: 100 ug via INTRAVENOUS
  Administered 2014-01-27: 50 ug via INTRAVENOUS
  Administered 2014-01-27: 100 ug via INTRAVENOUS

## 2014-01-27 MED ORDER — DIGOXIN 125 MCG PO TABS
0.1250 mg | ORAL_TABLET | Freq: Every day | ORAL | Status: DC
  Administered 2014-01-28: 0.125 mg via ORAL
  Filled 2014-01-27: qty 1

## 2014-01-27 MED ORDER — STERILE WATER FOR IRRIGATION IR SOLN
Status: DC | PRN
Start: 2014-01-27 — End: 2014-01-27
  Administered 2014-01-27: 1000 mL

## 2014-01-27 MED ORDER — OXYCODONE-ACETAMINOPHEN 5-325 MG PO TABS: 1.0000 | ORAL_TABLET | ORAL | Status: DC | PRN

## 2014-01-27 MED ORDER — INSULIN REGULAR HUMAN 100 UNIT/ML IJ SOLN
10.0000 [IU] | INTRAMUSCULAR | Status: DC
  Filled 2014-01-27: qty 0.1

## 2014-01-27 MED ORDER — MORPHINE SULFATE 2 MG/ML IJ SOLN
2.0000 mg | INTRAMUSCULAR | Status: DC | PRN
  Administered 2014-01-27: 4 mg via INTRAVENOUS
  Administered 2014-01-27 (×2): 2 mg via INTRAVENOUS
  Administered 2014-01-27 – 2014-01-28 (×4): 4 mg via INTRAVENOUS
  Filled 2014-01-27: qty 1
  Filled 2014-01-27 (×4): qty 2
  Filled 2014-01-27: qty 1
  Filled 2014-01-27 (×2): qty 2

## 2014-01-27 MED ORDER — CARVEDILOL 12.5 MG PO TABS
12.5000 mg | ORAL_TABLET | Freq: Two times a day (BID) | ORAL | Status: DC
  Filled 2014-01-27 (×4): qty 1

## 2014-01-27 SURGICAL SUPPLY — 34 items
ALCOHOL 70% 16 OZ (MISCELLANEOUS) ×2 IMPLANT
ATTRACTOMAT 16X20 MAGNETIC DRP (DRAPES) ×2 IMPLANT
BLADE SURG 15 STRL LF DISP TIS (BLADE) ×2 IMPLANT
BLADE SURG 15 STRL SS (BLADE) ×2
COVER SURGICAL LIGHT HANDLE (MISCELLANEOUS) ×2 IMPLANT
GAUZE PACKING FOLDED 2  STR (GAUZE/BANDAGES/DRESSINGS) ×1
GAUZE PACKING FOLDED 2 STR (GAUZE/BANDAGES/DRESSINGS) ×1 IMPLANT
GAUZE SPONGE 4X4 16PLY XRAY LF (GAUZE/BANDAGES/DRESSINGS) ×4 IMPLANT
GLOVE BIOGEL PI IND STRL 6 (GLOVE) ×1 IMPLANT
GLOVE BIOGEL PI INDICATOR 6 (GLOVE) ×1
GLOVE SURG ORTHO 8.0 STRL STRW (GLOVE) ×2 IMPLANT
GLOVE SURG SS PI 6.0 STRL IVOR (GLOVE) ×2 IMPLANT
GOWN STRL REUS W/ TWL LRG LVL3 (GOWN DISPOSABLE) ×1 IMPLANT
GOWN STRL REUS W/TWL 2XL LVL3 (GOWN DISPOSABLE) ×2 IMPLANT
GOWN STRL REUS W/TWL LRG LVL3 (GOWN DISPOSABLE) ×1
HEMOSTAT SURGICEL .5X2 ABSORB (HEMOSTASIS) IMPLANT
KIT BASIN OR (CUSTOM PROCEDURE TRAY) ×2 IMPLANT
KIT ROOM TURNOVER OR (KITS) ×2 IMPLANT
MANIFOLD NEPTUNE WASTE (CANNULA) ×2 IMPLANT
NEEDLE BLUNT 16X1.5 OR ONLY (NEEDLE) ×2 IMPLANT
NS IRRIG 1000ML POUR BTL (IV SOLUTION) ×4 IMPLANT
PACK EENT II TURBAN DRAPE (CUSTOM PROCEDURE TRAY) ×2 IMPLANT
PAD ARMBOARD 7.5X6 YLW CONV (MISCELLANEOUS) ×4 IMPLANT
SPONGE GAUZE 4X4 12PLY STER LF (GAUZE/BANDAGES/DRESSINGS) ×2 IMPLANT
SPONGE SURGIFOAM ABS GEL 100 (HEMOSTASIS) IMPLANT
SPONGE SURGIFOAM ABS GEL 12-7 (HEMOSTASIS) IMPLANT
SPONGE SURGIFOAM ABS GEL SZ50 (HEMOSTASIS) IMPLANT
SUCTION FRAZIER TIP 10 FR DISP (SUCTIONS) ×2 IMPLANT
SUT CHROMIC 3 0 PS 2 (SUTURE) ×6 IMPLANT
SUT CHROMIC 4 0 P 3 18 (SUTURE) ×4 IMPLANT
SYR 50ML SLIP (SYRINGE) ×2 IMPLANT
TOWEL OR 17X26 10 PK STRL BLUE (TOWEL DISPOSABLE) ×2 IMPLANT
TUBE CONNECTING 12X1/4 (SUCTIONS) ×2 IMPLANT
YANKAUER SUCT BULB TIP NO VENT (SUCTIONS) ×2 IMPLANT

## 2014-01-27 NOTE — Op Note (Signed)
Patient:            Mitchell ChimesStacy L [name] Date of Birth:  11/20/1970 MRN:                [id number]   DATE OF PROCEDURE:  01/27/2014               OPERATIVE REPORT   PREOPERATIVE DIAGNOSES: 1. Diabetes mellitus-type II, Uncontrolled  2. Chronic Heart failure 3. Obstructive sleep apnea 4. Acute pulpitis 5. Rampant dental caries 6. Chronic apical periodontitis 7. Chronic periodontitis 8. Multiple retained root segments   POSTOPERATIVE DIAGNOSES: 1. Diabetes mellitus-type II, Uncontrolled  2. Chronic Heart failure 3. Obstructive sleep apnea 4. Acute pulpitis 5. Rampant dental caries 6. Chronic apical periodontitis 7. Chronic periodontitis 8. Multiple retained root segments   OPERATIONS: 1. Multiple extraction of tooth numbers 1, 2, 3, 4, 5, 6, 7, 8, 9, 10, 11, 12, 13, 14, 15, 16, 20, 21, 22, 23, 24, 25, 26, 27, 28, 29, 31, and 32 2. 4 Quadrants of alveoloplasty   SURGEON: Charlynne Panderonald F. Kulinski, DDS  ASSISTANT: Rory PercyLisa Ingram, (dental assistant)  ANESTHESIA: General anesthesia via oral endotracheal tube.  MEDICATIONS: 1. Ancef 3 g IV prior to invasive dental procedures. 2. Local anesthesia with a total utilization of 6 carpules each containing 34 mg of lidocaine with 0.017 mg of epinephrine as well as 2 carpules each containing 9 mg of bupivacaine with 0.009 mg of epinephrine.  SPECIMENS: There are 29 teeth that were discarded.  DRAINS: None  CULTURES: None  COMPLICATIONS: None   ESTIMATED BLOOD LOSS: 150 mLs.  INTRAVENOUS FLUIDS: 1000 mLs of Lactated ringers solution.  INDICATIONS: The patient was recently admitted with a diagnosis of diabetic ketoacidosis and uncontrolled diabetes mellitus-type II. Patient also has a history of sleep apnea and chronic heart failure.  A medically necessary dental consultation was then requested to evaluate poor dentition.  The patient was examined and treatment planned for extraction of remaining teeth with alveoloplasty and pre-prosthetic  surgery as indicated with general anesthesia.  This treatment plan was formulated to decrease the risks and complications associated with dental infection from affecting the patient's systemic health, uncontrolled diabetes mellitus-type II, and risk for endocarditis.  OPERATIVE FINDINGS: Patient was examined operating room number 8.  The teeth were identified for extraction. The patient was noted be affected by acute pulpitis, rampant dental caries, chronic apical periodontitis, multiple retained root segments and chronic periodontitis.   DESCRIPTION OF PROCEDURE: Patient was brought to the main operating room number 8. Patient was then placed in the supine position on the operating table. General anesthesia was then induced per the anesthesia team. The patient was then prepped and draped in the usual manner for dental medicine procedure. A timeout was performed. The patient was identified and procedures were verified. A throat pack was placed at this time. The oral cavity was then thoroughly examined with the findings noted above. The patient was then ready for dental medicine procedure as follows:  Local anesthesia was then administered sequentially with a total utilization of 6 carpules each containing 34 mg of lidocaine with 0.017 mg of epinephrine as well as 2 carpules  each containing 9 mg bupivacaine with 0.009 mg of epinephrine.  The Maxillary left and right quadrants first approached. Anesthesia was then delivered utilizing infiltration with lidocaine with epinephrine. A #15 blade incision was then made from the maxillary right tuberosity and extended to the maxillary left tuberosity.  A  surgical flap was then carefully reflected. Appropriate  amounts of buccal and interseptal bone were then removed utilizing a surgical handpiece and bur and copious amounts of sterile water around tooth numbers 4, 5, 6, 11, 12, 13.  The upper teeth and retained roots were then subluxated with a series of straight  elevators. Tooth numbers 1, 2, 3, 4, 5, 6, 7, 8, 9, 10, 11, 12, 13, 14, 15, 16 were then removed with a 150 forceps and/or rongeurs without complications. Alveoloplasty was then performed utilizing a ronguers and bone file. The surgical site was then irrigated with copious amounts of sterile saline. The tissues were approximated and trimmed appropriately. The maxillary right surgical site was then closed from the maxillary right tuberosity and extended to the mesial numbers 8 utilizing 3-0 chromic gut suture in a continuous interrupted suture technique x1. The maxillary left surgical site was then closed from the maxillary left tuberosity and extended to the mesial #utilizing 3-0 chromic gut suture in a continuous interrupted suture technique x1. 3 individual interrupted sutures then placed to further close the surgical site as needed.  At this point time, the mandibular quadrants were approached. The patient was given bilateral inferior alveolar nerve blocks and long buccal nerve blocks utilizing the bupivacaine with epinephrine. Further infiltration was then achieved utilizing the lidocaine with epinephrine. A 15 blade incision was then made from the distal of number #17 and extended to the distal of #32.  A surgical flap was then carefully reflected. Appropriate amounts of buccal and interseptal bone were then removed utilizing a surgical handpiece and copious amount of sterile water of tooth numbers 17, 20, 21, 22, 27, 28, 29, 31, and 32. The lower teeth were then subluxated with a series of straight elevators. Tooth numbers 20, 21, 22, 23, 24, 25, 26, coronal aspect of 27, 28, and 29 were then removed with a 151 forceps. Tooth numbers 31 and 32 that had the coronal aspect removed with a #23 forceps leaving roots remaining. Further bone was then removed utilizing a surgical handpiece and bur and copious amounts sterile saline tooth numbers 27, 31, and 32. The retained roots of tooth numbers 31 and 32 were then  elevated out with a series of cryers elevators without complication.  Tooth #27 was then removed with a 151 forceps after further bone removal. Tooth #17 was then approached. The teeth were then removed with a 17 forceps without complications.  Alveoloplasty was then performed utilizing a rongeurs and bone file. The tissues were approximated and trimmed appropriately. The surgical sites were then irrigated with copious amounts of sterile saline. The mandibular left surgical site was then closed from the distal of  #17 and extended the mesial numbers 24 utilizing 3-0 chromic gut suture in a continuous interrupted suture technique times 1.  The mandibular right surgical site was then closed from the distal of 32 and extended the mesial numbers 25 utilizing 3-0 chromic gut suture in a continuous interrupted suture technique x1. 4 interrupted sutures are then placed to further closed surgical site utilizing 3-0 chromic gut material.    At this point time, the entire mouth was irrigated with copious amounts of sterile saline. The patient was examined for complications, seeing none, the dental medicine procedure was deemed to be complete. The throat pack was removed at this time. A series of 4 x 4 gauze were placed in the mouth to aid hemostasis. The patient was then handed over to the anesthesia team for final disposition. After an appropriate amount of time, the patient was extubated  and taken to the postanesthsia care unit with stable vital signs and a good condition. All counts were correct for the dental medicine procedure. The patient will be kept overnight for observation due to history sleep apnea and evaluation of uncontrolled diabetes mellitus-type II.  The patient will be discharged appropriately by cardiology and hospitalist teams.     Charlynne Pander, DDS.

## 2014-01-27 NOTE — Transfer of Care (Signed)
Immediate Anesthesia Transfer of Care Note  Patient: Mitchell [name]  Procedure(s) Performed: Procedure(s): EXTRACTION OF TEETH #'1, 2, 3, 4, 5, 6, 7, 8, 9, 10, 11, 12, 13, 14, 15, 16, 17, 20, 21, 22, 23, 24, 25, 26, 27, 28, 29, 31 and 32 WITH ALVEOLOPLASTY (N/A)  Patient Location: PACU  Anesthesia Type:General  Level of Consciousness: awake, alert , oriented and patient cooperative  Airway & Oxygen Therapy: Patient Spontanous Breathing and Patient connected to face mask oxygen  Post-op Assessment: Report given to PACU RN, Post -op Vital signs reviewed and stable and Patient moving all extremities X 4  Post vital signs: Reviewed and stable  Complications: No apparent anesthesia complications

## 2014-01-27 NOTE — Discharge Instructions (Signed)

## 2014-01-27 NOTE — H&P (Signed)
01/27/2014  Patient:            Mitchell [name] Date of Birth:  02/09/1971 MRN:                [id number]  BP 133/79  Pulse 86  Temp(Src) 98.7 F (37.1 C)  Resp 20  Ht 6' (1.829 m)  Wt 287 lb (130.182 kg)  BMI 38.92 kg/m2  SpO2 100%    Patient presents for multiple extractions with alveoloplasty and pre-prosthetic surgery as needed in the operating room with general anesthesia. Patient denies having any acute medical or dental changes. Please see note from heart failure clinic to use as a history of physical for the dental operating room procedure. Discussion with Mitchell. Pamella [name] indicated that patient was cleared for dental procedures with general anesthesia-today. Mitchell [name], DDS   Author: Sherald HessAmy D Clegg, NP Service: Heart Failure Author Type: Nurse Practitioner    Filed: 01/22/2014  9:48 AM Note Time: 01/22/2014  9:29 AM Status: Signed    Editor: Mitchell HessAmy D Clegg, NP (Nurse Practitioner)        Patient ID: Mitchell ChimesStacy L [name], male   DOB: 11/25/1970, 43 y.o.   MRN: [id number]   PCP:Mitchell [name]: Mitchell [name]   Pulmonologist: None   HPI:   [name] is a 43 year old male with multiple medical problems including obesity, uncontrolled hypertension, uncontrolled diabetes, obstructive sleep apnea, bipolar disease/schizophrenia, CVA, nonobstructive coronary disease and systolic congestive heart failure secondary to nonischemic cardiomyopathy.   [facility] through 01/09/14 with uncontrolled diabetes. Started back on insulin. He was also referred to Mitchell Mitchell [name] for teeth extractions. Bidil stopped due to suspected syncope. Discharge weight 276 pounds.    He returns for post hospital follow up. Denies SOB/PND/Orthopnea. Able to walk up steps. Taking insulin and following recommendations per PCP. Not weighing at home says he lost his scale. He has been referred to pulmonary but he didn't follow up. Says he would to schedule. Snores a lot.  Taking all  medications. Trying to follow low diet and limits fluid intake to < 2 liters.    09/23/11: 2D echo checked with reduction in LVEF to 15-20% with mild to moderate mitral valve regurgitation ECHO 10/30/12 EF 20-25%   ECHO 11/22/13 EF 20-25%    Labs 01/09/14 Pro BNP 235   Labs 01/09/14 K 3.6 Creatinine 0.73    SH: Lives at home with wife and children. Does not smoke or drink alcohol. FH: Mom had heart diease   ROS: As per HPI otherwise negative    Past Medical History   Diagnosis  Date   .  Hypertension     .  Hyperlipidemia     .  Depression     .  OSA (obstructive sleep apnea)         On CPAP; NON-COMPLIANT   .  CVA (cerebral infarction)         No residual deficits   .  Non-ischemic cardiomyopathy         No ischemia on myoview, showed EF of 42%. 2D echo showed EF 50-55% with diatolic dysfunction in 2009   .  Post-cardiac injury syndrome         History of cardiac injury from blunt trauma   .  PVCs (premature ventricular contractions)     .  History of colonic polyps     .  Obesity     .  Syncope  Recurrent, thought to be vasovagal. Also has h/o frequent PVCs.    .  Chronic systolic heart failure  11/2010       a. NICM 4/14 EF 20-25%, TR mild   .  SOB (shortness of breath)     .  Stroke     .  Herpes simplex of male genitalia     .  Myocardial infarction     .  Headache(784.0)     .  Diabetes mellitus         TYPE II; UNCONTROLLED BY HEMOGLOBIN A1c; STABLE AS  PER DISCHARGE   .  CHF (congestive heart failure)           Current Outpatient Prescriptions   Medication  Sig  Dispense  Refill   .  carvedilol (COREG) 25 MG tablet  Take 1 tablet (25 mg total) by mouth 2 (two) times daily with a meal.   60 tablet   0   .  digoxin (LANOXIN) 0.25 MG tablet  Take 1 tablet (0.25 mg total) by mouth daily.   30 tablet   6   .  furosemide (LASIX) 40 MG tablet  Take 1 tablet (40 mg total) by mouth 2 (two) times daily.   60 tablet   0   .  insulin aspart protamine- aspart  (NOVOLOG MIX 70/30) (70-30) 100 UNIT/ML injection  Inject 0.4 mLs (40 Units total) into the skin 2 (two) times daily with a meal.   10 mL   1   .  losartan (COZAAR) 100 MG tablet  Take 1 tablet (100 mg total) by mouth daily.   30 tablet   0   .  magnesium oxide (MAG-OX) 400 (241.3 MG) MG tablet  Take 1 tablet (400 mg total) by mouth daily.   30 tablet   0   .  magnesium oxide (MAG-OX) 400 MG tablet  Take 400 mg by mouth daily.         .  QUEtiapine (SEROQUEL) 100 MG tablet  Take 1 tablet (100 mg total) by mouth at bedtime.   30 tablet   0   .  spironolactone (ALDACTONE) 50 MG tablet  Take 1 tablet (50 mg total) by mouth daily.   30 tablet   0   .  sertraline (ZOLOFT) 100 MG tablet  Take 1 tablet (100 mg total) by mouth 2 (two) times daily.   30 tablet   0       No current facility-administered medications for this encounter.         Allergies   Allergen  Reactions   .  Nsaids  Anaphylaxis        PHYSICAL EXAM: Filed Vitals:     01/22/14 0923   BP:  122/81   Pulse:  87   Weight:  288 lb (130.636 kg)   SpO2:  98%        General:  Well appearing. No respiratory difficulty HEENT: normal Neck: supple. JVP flat Carotids 2+ bilat; no bruits. No lymphadenopathy or thryomegaly appreciated. Cor: PMI nondisplaced. Tachycardic & regular rhythm. No rubs, or murmurs. No  S3 Lungs: clear Abdomen: Obese, soft, nontender, distended. No hepatosplenomegaly. No bruits or masses. Good bowel sounds. Extremities: no cyanosis, clubbing, rash, trace edema Neuro: alert & oriented x 3, cranial nerves grossly intact. moves all 4 extremities w/o difficulty. Affect pleasant.       ASSESSMENT & PLAN:   1) Chronic systolic HF, NICM ECHO 12/2013 EF 20-25%   -  NYHA II. Volume status stable.  Continue lasix 40 mg twice a day and spironolactone 50 mg daily. May need to cut back spiro if K rising with increased losartan.   - On goal carvedilol to 25 mg twice a day. Continue digoxin 0.25 mg daily   -  Continue losartan 100 mg in am and add 50 mg in pm which will be goal   - - He continues to refuse ICD and understands he is at risk for SCD.   - Will get BMET and dig level today. Check BMET next week.   -Provided a scale and weight chart today.   -Reinforced the need and importance of daily weights, a low sodium diet, and fluid restriction (less than 2 L a day). Instructed to call the HF clinic if weight increases more than 3 lbs overnight or 5 lbs in a week.   - Follow up in 1 month    2) HTN-Stable. Continue current regimen and add 50 mg losartan in evening.  continue BB. ARB and Cleda Daub. 3) DM - per PCP Has appointment July 8th 4) Noncompliance- Stressed the importance of medication compliance   5) Snores- Refer to pulmonary for sleep study.   [name],AMY NP-C 9:32 AM

## 2014-01-27 NOTE — Anesthesia Preprocedure Evaluation (Signed)
Anesthesia Evaluation  Patient identified by MRN, date of birth, ID band Patient awake    Reviewed: Allergy & Precautions, H&P , NPO status , Patient's Chart, lab work & pertinent test results, reviewed documented beta blocker date and time   Airway Mallampati: II TM Distance: >3 FB Neck ROM: full    Dental   Pulmonary shortness of breath and with exertion, sleep apnea , pneumonia -, resolved,  breath sounds clear to auscultation        Cardiovascular hypertension, On Medications and On Home Beta Blockers + CAD, + Past MI and +CHF Rhythm:regular     Neuro/Psych  Headaches, PSYCHIATRIC DISORDERS Depression Bipolar Disorder Schizophrenia CVA, No Residual Symptoms    GI/Hepatic negative GI ROS, Neg liver ROS,   Endo/Other  diabetes, Insulin Dependent  Renal/GU negative Renal ROS  negative genitourinary   Musculoskeletal   Abdominal   Peds  Hematology negative hematology ROS (+)   Anesthesia Other Findings See surgeon's H&P   Reproductive/Obstetrics negative OB ROS                           Anesthesia Physical Anesthesia Plan  ASA: III  Anesthesia Plan: General   Post-op Pain Management:    Induction: Intravenous  Airway Management Planned: Oral ETT  Additional Equipment:   Intra-op Plan:   Post-operative Plan: Extubation in OR  Informed Consent: I have reviewed the patients History and Physical, chart, labs and discussed the procedure including the risks, benefits and alternatives for the proposed anesthesia with the patient or authorized representative who has indicated his/her understanding and acceptance.   Dental Advisory Given  Plan Discussed with: CRNA and Surgeon  Anesthesia Plan Comments:         Anesthesia Quick Evaluation

## 2014-01-27 NOTE — Progress Notes (Addendum)
Dr. Gelene Mink into to see pt., order for insulin rec'd. Pt. Seems slow with his thoughts, expressively, but denies S&S of hyperglycemia.

## 2014-01-27 NOTE — Progress Notes (Signed)
Spoke with Dr. Chaney Malling, report given relative to CBG today. Will wait on assigned anesthesiologist .

## 2014-01-27 NOTE — H&P (Addendum)
Advanced Heart Failure H&P  PCP:Dr Caffie Damme [facility]  Mental Health: [facility]  Primary Cardiologist: Dr Gala Romney  HPI: Mitchell [name] is a 43 year old male with multiple medical problems including obesity, uncontrolled hypertension, uncontrolled diabetes, obstructive sleep apnea, bipolar disease/schizophrenia, CVA, nonobstructive coronary disease and systolic congestive heart failure secondary to nonischemic cardiomyopathy (ECHO EF 20-25%).   He is admitted today for observation after multiple teeth extractions . He has known chronic systolic heart failure and was last seen in HF clinic June 25. At that time he was stable.   SH: Lives at home with wife and children. Does not smoke or drink alcohol.  FH: Mom had heart diease   Review of Systems: Evaluated in PACU Very drowsy from sedation. Unable to answer questions with packing in his mouth.     Cardiac Review of Systems: {Y] = yes [ ]  = no  Chest Pain [    ]  Resting SOB [   ] Exertional SOB  [  ]  Orthopnea [  ]   Pedal Edema [   ]    Palpitations [  ] Syncope  [  ]   Presyncope [   ]  General Review of Systems: [Y] = yes [  ]=no Constitional: recent weight change [  ]; anorexia [  ]; fatigue [  ]; nausea [  ]; night sweats [  ]; fever [  ]; or chills [  ];                                                                                                                                          Dental: poor dentition[Y ]; Last Dentist visit: 01/23/14   Eye : blurred vision [  ]; diplopia [   ]; vision changes [  ];  Amaurosis fugax[  ]; Resp: cough [  ];  wheezing[  ];  hemoptysis[  ]; shortness of breath[  ]; paroxysmal nocturnal dyspnea[  ]; dyspnea on exertion[  ]; or orthopnea[  ];  GI:  gallstones[  ], vomiting[  ];  dysphagia[  ]; melena[  ];  hematochezia [  ]; heartburn[  ];   Hx of  Colonoscopy[  ]; GU: kidney stones [  ]; hematuria[  ];   dysuria [  ];  nocturia[  ];  history of     obstruction [  ];  Skin: rash, swelling[  ];, hair loss[  ];  peripheral edema[  ];  or itching[  ]; Musculosketetal: myalgias[  ];  joint swelling[  ];  joint erythema[  ];  joint pain[  ];  back pain[  ];  Heme/Lymph: bruising[  ];  bleeding[  ];  anemia[  ];  Neuro: TIA[  ];  headaches[  ];  stroke[  ];  vertigo[  ];  seizures[  ];   paresthesias[  ];  difficulty walking[  ];  Psych:depression[ ] ; anxiety[  ];  Endocrine: diabetes[Y  ];  thyroid dysfunction[  ];  Immunizations: Flu [  ]; Pneumococcal[  ];  Other:  Past Medical History  Diagnosis Date  . Hypertension   . Hyperlipidemia   . CVA (cerebral infarction)     No residual deficits  . Non-ischemic cardiomyopathy     No ischemia on myoview, showed EF of 42%. 2D echo showed EF 50-55% with diatolic dysfunction in 2009  . Post-cardiac injury syndrome     History of cardiac injury from blunt trauma  . PVCs (premature ventricular contractions)   . History of colonic polyps   . Obesity   . Syncope     Recurrent, thought to be vasovagal. Also has h/o frequent PVCs.   . Chronic systolic heart failure 11/2010    a. NICM 4/14 EF 20-25%, TR mild  . SOB (shortness of breath)   . 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 Headache(784.0)   . Diabetes mellitus     TYPE II; UNCONTROLLED BY HEMOGLOBIN A1c; STABLE AS  PER DISCHARGE  . CHF (congestive heart failure)   . Myocardial infarction     at age 43 years old (while playing football)  . Stroke 2005    some left side weakness  . OSA (obstructive sleep apnea)     Being set up again for c-pap  . Pneumonia   . Depression     PTSD,   . Bipolar disorder   . Schizophrenia     [facility]    Medications Prior to Admission  Medication Sig Dispense Refill  . carvedilol (COREG) 25 MG tablet Take 1 tablet (25 mg total) by mouth 2 (two) times daily with a meal.  60 tablet  0  . digoxin (LANOXIN) 0.25 MG tablet Take 0.25 mg by mouth daily.      . furosemide (LASIX) 40 MG tablet Take  1 tablet (40 mg total) by mouth 2 (two) times daily.  60 tablet  0  . insulin NPH-regular Human (NOVOLIN 70/30) (70-30) 100 UNIT/ML injection Inject 40 Units into the skin 2 (two) times daily with a meal.      . losartan (COZAAR) 100 MG tablet Take 50-100 mg by mouth 2 (two) times daily. Takes 100 mg in the morning and 50 mg in the evening      . magnesium oxide (MAG-OX) 400 MG tablet Take 400 mg by mouth daily.      . QUEtiapine (SEROQUEL) 100 MG tablet Take 1 tablet (100 mg total) by mouth at bedtime.  30 tablet  0  . spironolactone (ALDACTONE) 50 MG tablet Take 1 tablet (50 mg total) by mouth daily.  30 tablet  0  . Aspirin-Salicylamide-Caffeine (BC HEADACHE POWDER PO) Take 2 packets by mouth once.      [name] Kitchen. HYDROcodone-acetaminophen (NORCO) 5-325 MG per tablet Take 1-2 tablets by mouth every 6 (six) hours as needed for moderate pain.  30 tablet  0  . naproxen sodium (ALEVE) 220 MG tablet Take 220 mg by mouth daily as needed.      . sertraline (ZOLOFT) 100 MG tablet Take 1 tablet (100 mg total) by mouth 2 (two) times daily.  30 tablet  0     Allergies  Allergen Reactions  . Nsaids Anaphylaxis    History   Social History  . Marital Status: Married    Spouse Name: N/A    Number of Children: 5  . Years of Education:  N/A   Occupational History  . Mortician    Social History Main Topics  . Smoking status: Never Smoker   . Smokeless tobacco: Never Used  . Alcohol Use: No     Comment: Never used  . Drug Use: No     Comment: Never sued  . Sexual Activity: Yes   Other Topics Concern  . Not on file   Social History Narrative   MARRIED, LIVES IN [place] WITH WIFE; GREW UP IN [place] AND USED TO BE A COOK; HE ENJOYS COOKING AND ENJOYS EATING A LOT OF PORK AND SALT.    Family History  Problem Relation Age of Onset  . Heart disease Mother     MI  . Heart failure Mother   . Diabetes Mother     ALSO IN MOST OF HIS SIBLINGS; 2 UNLCES HAVE ALSO PASSED AWAY FROM DM  .  Cardiomyopathy Mother   . Cancer - Ovarian Mother   . Heart disease Father   . Hypertension Father   . Diabetes Father     PHYSICAL EXAM: Filed Vitals:   01/27/14 1215  BP: 125/73  Pulse: 87  Temp: 97.7 F (36.5 C)  Resp: 12   General:  Drowsy - No respiratory difficulty HEENT: Ice pack to head and face Neck: supple. No JVD. Carotids 2+ bilat; no bruits. No lymphadenopathy or thryomegaly appreciated. Cor: PMI nondisplaced. Regular rate & rhythm. No rubs, gallops or murmurs. Lungs: clear Abdomen: soft, nontender, nondistended. No hepatosplenomegaly. No bruits or masses. Good bowel sounds. Extremities: no cyanosis, clubbing, rash, edema Neuro: Drowsy with gauze in mouth cranial nerves grossly intact. moves all 4 extremities w/o difficulty.   ECG: SR 80s   Results for orders placed during the hospital encounter of 01/27/14 (from the past 24 hour(s))  GLUCOSE, CAPILLARY     Status: Abnormal   Collection Time    01/27/14  6:12 AM      Result Value Ref Range   Glucose-Capillary 321 (*) 70 - 99 mg/dL  GLUCOSE, CAPILLARY     Status: Abnormal   Collection Time    01/27/14 10:46 AM      Result Value Ref Range   Glucose-Capillary 268 (*) 70 - 99 mg/dL   Comment 1 Documented in Chart     Comment 2 Notify RN     No results found.   ASSESSMENT: 1. Multiple Extractions tooth numbers 1, 2, 3, 4, 5, 6, 7, 8, 9, 10, 11, 12, 13, 14, 15, 16, 20, 21, 22, 23, 24, 25, 26, 27, 28, 29, 31, and 32 2. Chronic Systolic Heart Failure ECHO 12/2013 EF 20-25%  3. HTN 4. DM   PLAN/DISCUSSION: Mitchell [name] is admitted for observation after multiple ttooth extractions with known chronic systolic heart failure. Prior to admit his HF was stable. Continue HF current regimen as his diet permits. Check BMET in am.   Plan for oral care per Dr Kristin Bruins. Hopefully can discharge tomorrow.   CLEGG,AMY NP-C  1:04 PM  Patient seen and examined with Tonye Becket, NP. We discussed all aspects of the encounter. I  agree with the assessment and plan as stated above.   Doing well. Will observe overnight after dental extraction. HF well compensated. Continue HF meds. Appreciate Dr. Luretha Murphy care.  Daniel Bensimhon,MD 1:25 PM

## 2014-01-27 NOTE — Anesthesia Postprocedure Evaluation (Signed)
Anesthesia Post Note  Patient: Mitchell [name]  Procedure(s) Performed: Procedure(s) (LRB): EXTRACTION OF TEETH #'1, 2, 3, 4, 5, 6, 7, 8, 9, 10, 11, 12, 13, 14, 15, 16, 17, 20, 21, 22, 23, 24, 25, 26, 27, 28, 29, 31 and 32 WITH ALVEOLOPLASTY (N/A)  Anesthesia type: General  Patient location: PACU  Post pain: Pain level controlled  Post assessment: Patient's Cardiovascular Status Stable  Last Vitals:  Filed Vitals:   01/27/14 1145  BP: 119/74  Pulse: 81  Temp:   Resp: 14    Post vital signs: Reviewed and stable  Level of consciousness: alert  Complications: No apparent anesthesia complications

## 2014-01-27 NOTE — Progress Notes (Signed)
PRE-OPERATIVE NOTE:  01/27/2014 Mitchell [name] [id number]  VITALS: BP 133/79  Pulse 86  Temp(Src) 98.7 F (37.1 C)  Resp 20  Ht 6' (1.829 m)  Wt 287 lb (130.182 kg)  BMI 38.92 kg/m2  SpO2 100%  Lab Results  Component Value Date   WBC 10.2 01/26/2014   HGB 13.7 01/26/2014   HCT 40.8 01/26/2014   MCV 89.3 01/26/2014   PLT 205 01/26/2014   BMET    Component Value Date/Time   NA 142 01/22/2014 0945   K 4.6 01/22/2014 0945   CL 100 01/22/2014 0945   CO2 25 01/22/2014 0945   GLUCOSE 234* 01/22/2014 0945   BUN 12 01/22/2014 0945   CREATININE 0.77 01/22/2014 0945   CALCIUM 8.7 01/22/2014 0945   GFRNONAA >90 01/22/2014 0945   GFRAA >90 01/22/2014 0945    Lab Results  Component Value Date   INR 1.07 12/19/2010   INR 1.0 09/19/2007   INR 1.0 06/08/2007   No results found for this basename: PTT     Mitchell [name]  presents for multiple dental extractions with alveoloplasty and pre-prosthetic surgery as indicated in the operating room with general anesthesia.   SUBJECTIVE: The patient denies any acute medical or dental changes and agrees to proceed with treatment as planned.  EXAM: No sign of acute dental changes.  ASSESSMENT: Patient is affected by history of diabetes mellitus type that is uncontrolled , acute pulpitis, chronic apical periodontitis, chronic periodontitis, rampant dental caries, and multiple retained root segments.   PLAN: Patient agrees to proceed with treatment as planned in the operating room as previously discussed and accepts the risks, benefits, complications of the proposed treatment. The patient is without complications to include bleeding, bruising, swelling, infection, pain, damage, sinus involvement, root fracture, mandible fracture, and complications of anesthesia. Patient also is aware of the potential for cardiovascular complications up to and including death. Patient also is aware of the potential for complications not mentioned above.   Charlynne Pander, DDS

## 2014-01-28 ENCOUNTER — Encounter (HOSPITAL_COMMUNITY): Payer: Self-pay | Admitting: Dentistry

## 2014-01-28 LAB — BASIC METABOLIC PANEL
BUN: 15 mg/dL (ref 6–23)
CO2: 30 mEq/L (ref 19–32)
CREATININE: 0.89 mg/dL (ref 0.50–1.35)
Calcium: 8.9 mg/dL (ref 8.4–10.5)
Chloride: 97 mEq/L (ref 96–112)
Glucose, Bld: 293 mg/dL — ABNORMAL HIGH (ref 70–99)
Potassium: 4.2 mEq/L (ref 3.7–5.3)
Sodium: 140 mEq/L (ref 137–147)

## 2014-01-28 LAB — GLUCOSE, CAPILLARY: Glucose-Capillary: 346 mg/dL — ABNORMAL HIGH (ref 70–99)

## 2014-01-28 LAB — DIGOXIN LEVEL: DIGOXIN LVL: 0.7 ng/mL — AB (ref 0.8–2.0)

## 2014-01-28 MED ORDER — OXYCODONE-ACETAMINOPHEN 5-325 MG PO TABS: 1.0000 | ORAL_TABLET | ORAL | Status: DC | PRN

## 2014-01-28 MED ORDER — AMOXICILLIN 500 MG PO CAPS: 500.0000 mg | ORAL_CAPSULE | Freq: Three times a day (TID) | ORAL | Status: DC

## 2014-01-28 MED ORDER — AMOXICILLIN 500 MG PO CAPS
500.0000 mg | ORAL_CAPSULE | Freq: Three times a day (TID) | ORAL | Status: DC
  Administered 2014-01-28: 500 mg via ORAL
  Filled 2014-01-28 (×3): qty 1

## 2014-01-28 MED ORDER — AMINOCAPROIC ACID SOLUTION 5% (50 MG/ML): 10.0000 mL | ORAL | Status: DC

## 2014-01-28 MED ORDER — ACETAMINOPHEN 325 MG PO TABS: 650.0000 mg | ORAL_TABLET | ORAL | Status: DC | PRN

## 2014-01-28 MED ORDER — CARVEDILOL 12.5 MG PO TABS
12.5000 mg | ORAL_TABLET | Freq: Two times a day (BID) | ORAL | Status: DC
  Filled 2014-01-28 (×3): qty 1

## 2014-01-28 MED ORDER — INSULIN ASPART PROT & ASPART (70-30 MIX) 100 UNIT/ML ~~LOC~~ SUSP
40.0000 [IU] | Freq: Two times a day (BID) | SUBCUTANEOUS | Status: DC
  Filled 2014-01-28: qty 10

## 2014-01-28 MED ORDER — AMINOCAPROIC ACID SOLUTION 5% (50 MG/ML)
10.0000 mL | ORAL | Status: DC
  Administered 2014-01-28 (×5): 10 mL via ORAL
  Filled 2014-01-28: qty 100

## 2014-01-28 NOTE — Progress Notes (Signed)
Advanced Heart Failure Rounding Note   Subjective:     Admitted yesterday for observations after multiple tooth extractions. Feeling ok. Denies SOB. Complaining of mouth pain.    Objective:   Weight Range:  Vital Signs:   Temp:  [97.2 F (36.2 C)-98.4 F (36.9 C)] 98.3 F (36.8 C) (07/01 0545) Pulse Rate:  [73-109] 101 (07/01 0545) Resp:  [12-20] 19 (07/01 0545) BP: (110-162)/(62-116) 154/90 mmHg (07/01 0545) SpO2:  [98 %-100 %] 99 % (07/01 0545) Weight:  [283 lb 15.2 oz (128.8 kg)-294 lb 12.1 oz (133.7 kg)] 283 lb 15.2 oz (128.8 kg) (07/01 0545) Last BM Date: 01/26/14  Weight change: Filed Weights   01/27/14 0610 01/27/14 1246 01/28/14 0545  Weight: 287 lb (130.182 kg) 294 lb 12.1 oz (133.7 kg) 283 lb 15.2 oz (128.8 kg)    Intake/Output:   Intake/Output Summary (Last 24 hours) at 01/28/14 0810 Last data filed at 01/27/14 2000  Gross per 24 hour  Intake   1020 ml  Output    850 ml  Net    170 ml     Physical Exam: General:  Facial edema noted. Sitting in chair HEENT: normal Neck: supple. JVP 6-7 . Carotids 2+ bilat; no bruits. No lymphadenopathy or thryomegaly appreciated. Cor: PMI nondisplaced. Regular rate & rhythm. No rubs, gallops or murmurs. Lungs: clear Abdomen: soft, nontender, nondistended. No hepatosplenomegaly. No bruits or masses. Good bowel sounds. Extremities: no cyanosis, clubbing, rash, edema Neuro: alert & orientedx3, cranial nerves grossly intact. moves all 4 extremities w/o difficulty. Affect pleasant  Telemetry: SR   Labs: Basic Metabolic Panel:  Recent Labs Lab 01/22/14 0945 01/27/14 1343 01/28/14 0510  NA 142  --  140  K 4.6  --  4.2  CL 100  --  97  CO2 25  --  30  GLUCOSE 234*  --  293*  BUN 12  --  15  CREATININE 0.77 1.01 0.89  CALCIUM 8.7  --  8.9    Liver Function Tests: No results found for this basename: AST, ALT, ALKPHOS, BILITOT, PROT, ALBUMIN,  in the last 168 hours No results found for this basename: LIPASE,  AMYLASE,  in the last 168 hours No results found for this basename: AMMONIA,  in the last 168 hours  CBC:  Recent Labs Lab 01/26/14 1101 01/27/14 1343  WBC 10.2 14.2*  HGB 13.7 12.6*  HCT 40.8 38.3*  MCV 89.3 89.3  PLT 205 155    Cardiac Enzymes: No results found for this basename: CKTOTAL, CKMB, CKMBINDEX, TROPONINI,  in the last 168 hours  BNP: BNP (last 3 results)  Recent Labs  11/22/13 1005 11/23/13 0420 01/07/14 1955  PROBNP 533.6* 233.2* 235.1*     Other results:  EKG:   Imaging:  No results found.   Medications:     Scheduled Medications: . aminocaproic acid  10 mL Oral Q1H  . amoxicillin  500 mg Oral TID  . carvedilol  12.5 mg Oral BID WC  . digoxin  0.125 mg Oral Daily  . enoxaparin (LOVENOX) injection  40 mg Subcutaneous Q24H  . insulin aspart protamine- aspart  40 Units Subcutaneous BID WC  . losartan  100 mg Oral Daily  . magnesium oxide  400 mg Oral Daily  . QUEtiapine  100 mg Oral QHS  . sertraline  100 mg Oral BID  . sodium chloride  3 mL Intravenous Q12H     Infusions: . lactated ringers       PRN Medications:  sodium chloride, acetaminophen, morphine injection, ondansetron (ZOFRAN) IV, oxyCODONE-acetaminophen, sodium chloride   Assessment:   1. S/P 01/27/14 Multiple Extractions tooth numbers 1, 2, 3, 4, 5, 6, 7, 8, 9, 10, 11, 12, 13, 14, 15, 16, 20, 21, 22, 23, 24, 25, 26, 27, 28, 29, 31, and 32  2. Chronic Systolic Heart Failure ECHO 12/2013 EF 20-25%  3. HTN  4. DM   Plan/Discussion:     From HF perspective stable. IF he can swallow will dc later this morning.   Length of Stay: 1   CLEGG,AMY 01/28/2014, 8:10 AM  Advanced Heart Failure Team Pager [phone] (M-F; 7a - 4p)  Please contact [facility] for night-coverage after hours (4p -7a ) and weekends on [email or web address]  Patient seen and examined with Tonye BecketAmy Clegg, NP. We discussed all aspects of the encounter. I agree with the assessment and plan as stated above. He  remains stable from HF perspective. If pain is controlled and can handle pos can go home later today.   Truman HaywardDaniel Italia Wolfert,MD 3:33 PM

## 2014-01-28 NOTE — Discharge Summary (Signed)
Advanced Heart Failure Team  Discharge Summary   Patient ID: Mitchell [name] MRN: [id number], DOB/AGE: [birth year] 45 y.o. Admit date: 01/27/2014 D/C date:     01/28/2014   Primary Discharge Diagnoses:  1. S/P 01/27/14 Multiple Extractions tooth numbers 1, 2, 3, 4, 5, 6, 7, 8, 9, 10, 11, 12, 13, 14, 15, 16, 20, 21, 22, 23, 24, 25, 26, 27, 28, 29, 31, and 32  2. Chronic Systolic Heart Failure ECHO 12/2013 EF 20-25%  3. HTN  4. DM     Hospital Course:   Mr. Anagnos is a 43 year old male with multiple medical problems including obesity, uncontrolled hypertension, uncontrolled diabetes, obstructive sleep apnea, bipolar disease/schizophrenia, CVA, nonobstructive coronary disease and systolic congestive heart failure secondary to nonischemic cardiomyopathy (ECHO EF 20-25%).   He was admitted for observation after multiple tooth extractions. Post operatively he had the expected amount of oozing and intraoral and extra oral swelling. His pain was controlled with IV morphine but on the day of discharge he was tolerating po pain medicine. He will continue oral care and antibiotics per Dr Kristin Bruins. At the time of discharge he as tolerating liquids, soft diet, and able to take oral medications.   From HF standpoint he remained stable. He will follow up in HF clinic July 8th at 9:15. He will follow up with Dr Verdie Shire July 9th.     Discharge Weight Range: 283 pounds.  Discharge Vitals: Blood pressure 154/90, pulse 101, temperature 98.3 F (36.8 C), temperature source Axillary, resp. rate 19, height 6' (1.829 m), weight 283 lb 15.2 oz (128.8 kg), SpO2 99.00%.  Labs: Lab Results  Component Value Date   WBC 14.2* 01/27/2014   HGB 12.6* 01/27/2014   HCT 38.3* 01/27/2014   MCV 89.3 01/27/2014   PLT 155 01/27/2014    Recent Labs Lab 01/28/14 0510  NA 140  K 4.2  CL 97  CO2 30  BUN 15  CREATININE 0.89  CALCIUM 8.9  GLUCOSE 293*   Lab Results  Component Value Date   CHOL  Value: 232        ATP  III CLASSIFICATION:  <200     mg/dL   Desirable  323-557  mg/dL   Borderline High  >=322    mg/dL   High       * 0/25/4270   HDL 40 11/08/2010   LDLCALC  Value: 173        Total Cholesterol/HDL:CHD Risk Coronary Heart Disease Risk Table                     Men   Women  1/2 Average Risk   3.4   3.3  Average Risk       5.0   4.4  2 X Average Risk   9.6   7.1  3 X Average Risk  23.4   11.0        Use the calculated Patient Ratio above and the CHD Risk Table to determine the patient's CHD Risk.        ATP III CLASSIFICATION (LDL):  <100     mg/dL   Optimal  623-762  mg/dL   Near or Above                    Optimal  130-159  mg/dL   Borderline  831-517  mg/dL   High  >616     mg/dL   Very High* 0/73/7106   TRIG 95 11/08/2010  BNP (last 3 results)  Recent Labs  11/22/13 1005 11/23/13 0420 01/07/14 1955  PROBNP 533.6* 233.2* 235.1*    Diagnostic Studies/Procedures   No results found.  Discharge Medications     Medication List    STOP taking these medications       ALEVE 220 MG tablet  Generic drug:  naproxen sodium     BC HEADACHE POWDER PO     HYDROcodone-acetaminophen 5-325 MG per tablet  Commonly known as:  NORCO      TAKE these medications       acetaminophen 325 MG tablet  Commonly known as:  TYLENOL  Take 2 tablets (650 mg total) by mouth every 4 (four) hours as needed for headache or mild pain.     aminocaproic acid 5 % Soln  Commonly known as:  AMICAR  Take 10 mLs by mouth every hour.     amoxicillin 500 MG capsule  Commonly known as:  AMOXIL  Take 1 capsule (500 mg total) by mouth 3 (three) times daily.     carvedilol 25 MG tablet  Commonly known as:  COREG  Take 1 tablet (25 mg total) by mouth 2 (two) times daily with a meal.     digoxin 0.25 MG tablet  Commonly known as:  LANOXIN  Take 0.25 mg by mouth daily.     furosemide 40 MG tablet  Commonly known as:  LASIX  Take 1 tablet (40 mg total) by mouth 2 (two) times daily.     insulin NPH-regular Human  (70-30) 100 UNIT/ML injection  Commonly known as:  NOVOLIN 70/30  Inject 40 Units into the skin 2 (two) times daily with a meal.     losartan 100 MG tablet  Commonly known as:  COZAAR  Take 50-100 mg by mouth 2 (two) times daily. Takes 100 mg in the morning and 50 mg in the evening     magnesium oxide 400 MG tablet  Commonly known as:  MAG-OX  Take 400 mg by mouth daily.     oxyCODONE-acetaminophen 5-325 MG per tablet  Commonly known as:  PERCOCET/ROXICET  Take 1-2 tablets by mouth every 4 (four) hours as needed for moderate pain.     QUEtiapine 100 MG tablet  Commonly known as:  SEROQUEL  Take 1 tablet (100 mg total) by mouth at bedtime.     sertraline 100 MG tablet  Commonly known as:  ZOLOFT  Take 1 tablet (100 mg total) by mouth 2 (two) times daily.     spironolactone 50 MG tablet  Commonly known as:  ALDACTONE  Take 1 tablet (50 mg total) by mouth daily.        Disposition   The patient will be discharged in stable condition to home.     Discharge Instructions   ACE Inhibitor / ARB already ordered    Complete by:  As directed      Consult to dietitian    Complete by:  As directed   Patient is edentulous.     Diet - low sodium heart healthy    Complete by:  As directed      Gauze    Complete by:  As directed   4 x 4 gauze to oral bleeding sites until oozing stops.     Heart Failure patients record your daily weight using the same scale at the same time of day    Complete by:  As directed      Increase activity slowly  Complete by:  As directed           Follow-up Information   Follow up with Charlynne Pander, DDS On 02/05/2014. (For suture removal @ 1:00 PM)    Specialty:  Dentistry   Contact information:   [address] [place] [address] [phone]       Follow up with [facility], NP On 02/04/2014. (Heart Failure Clinic at 9:15)    Specialty:  Nurse Practitioner   Contact information:   [address] [place]  [address] [phone]         Duration of Discharge Encounter: Greater than 35 minutes   Signed, CLEGG,AMY NP-C  01/28/2014, 1:12 PM  Patient seen and examined with Tonye Becket, NP. We discussed all aspects of the encounter. I agree with the assessment and plan as stated above. He is stable for d/c. Continue current HF regimen.   Truman Hayward 3:32 PM

## 2014-01-28 NOTE — Progress Notes (Signed)
Pt is being discharged home. Pt has been provided with discharge instructions. RN went over the instructions with the patient and answered all questions the patient had. Wife will transport the patient home

## 2014-01-28 NOTE — Progress Notes (Signed)
POST OPERATIVE NOTE:  01/28/2014 Mitchell [name] [id number]  VITALS: BP 154/90  Pulse 101  Temp(Src) 98.3 F (36.8 C) (Axillary)  Resp 19  Ht 6' (1.829 m)  Wt 283 lb 15.2 oz (128.8 kg)  BMI 38.50 kg/m2  SpO2 99%  LABS:  Lab Results  Component Value Date   WBC 14.2* 01/27/2014   HGB 12.6* 01/27/2014   HCT 38.3* 01/27/2014   MCV 89.3 01/27/2014   PLT 155 01/27/2014   BMET    Component Value Date/Time   NA 140 01/28/2014 0510   K 4.2 01/28/2014 0510   CL 97 01/28/2014 0510   CO2 30 01/28/2014 0510   GLUCOSE 293* 01/28/2014 0510   BUN 15 01/28/2014 0510   CREATININE 0.89 01/28/2014 0510   CALCIUM 8.9 01/28/2014 0510   GFRNONAA >90 01/28/2014 0510   GFRAA >90 01/28/2014 0510    Lab Results  Component Value Date   INR 1.07 12/19/2010   INR 1.0 09/19/2007   INR 1.0 06/08/2007   No results found for this basename: PTT     Mitchell [name] is status post extraction remaining teeth with alveoloplasty and pre-prosthetic surgery as indicated in the operating room general anesthesia.  SUBJECTIVE: Patient denies having any significant discomfort at this time. Patient indicates that there is some persistent oozing.   EXAM: Patient with evidence of active heme and oozing. Sutures are intact. Patient is edentulous. There is significant intraoral and extraoral swelling noted consistent with the extensive extraction procedures.   ASSESSMENT: Post operative course is consistent with dental procedures performed in the operating room with general anesthesia. Patient has intraoral and extraoral swelling. Patient has active heme and some oozing that may be related to Lovenox therapy. Elevated white count is noted.  PLAN: 1. Continue ice to face additional 12 hours. 2. Start amoxicillin 500 mg by mouth every 8 hours for 7 days. 3. Use Amicar 5% oral rinse as prescribed every hour for the next 10 hours in a swish and spit manner. Do not swallow. Then use as needed for persistent oozing.  4. Pain  medication per medical team. 5. return to clinic next week for evaluation for suture removal.  Charlynne Pander, DDS

## 2014-01-28 NOTE — Progress Notes (Signed)
UR Completed.  Mitchell [name] Jane [phone] 01/28/2014  

## 2014-01-29 ENCOUNTER — Encounter (HOSPITAL_COMMUNITY): Payer: Self-pay

## 2014-01-29 ENCOUNTER — Inpatient Hospital Stay (HOSPITAL_COMMUNITY): Admission: RE | Admit: 2014-01-29 | Payer: No Typology Code available for payment source | Source: Ambulatory Visit

## 2014-02-04 ENCOUNTER — Encounter (HOSPITAL_COMMUNITY): Payer: Self-pay

## 2014-02-04 ENCOUNTER — Ambulatory Visit (HOSPITAL_COMMUNITY)
Admission: RE | Admit: 2014-02-04 | Discharge: 2014-02-04 | Disposition: A | Discharge status: Home / Self Care | Payer: No Typology Code available for payment source | Source: Ambulatory Visit | Attending: Internal Medicine | Admitting: Internal Medicine

## 2014-02-04 VITALS — BP 137/84 | HR 86 | Resp 18 | Wt 292.5 lb

## 2014-02-04 DIAGNOSIS — F209 Schizophrenia, unspecified: Secondary | ICD-10-CM | POA: Insufficient documentation

## 2014-02-04 DIAGNOSIS — F4310 Post-traumatic stress disorder, unspecified: Secondary | ICD-10-CM | POA: Insufficient documentation

## 2014-02-04 DIAGNOSIS — I252 Old myocardial infarction: Secondary | ICD-10-CM | POA: Insufficient documentation

## 2014-02-04 DIAGNOSIS — E1165 Type 2 diabetes mellitus with hyperglycemia: Secondary | ICD-10-CM

## 2014-02-04 DIAGNOSIS — R0609 Other forms of dyspnea: Secondary | ICD-10-CM | POA: Insufficient documentation

## 2014-02-04 DIAGNOSIS — I509 Heart failure, unspecified: Secondary | ICD-10-CM | POA: Insufficient documentation

## 2014-02-04 DIAGNOSIS — F319 Bipolar disorder, unspecified: Secondary | ICD-10-CM | POA: Insufficient documentation

## 2014-02-04 DIAGNOSIS — G4733 Obstructive sleep apnea (adult) (pediatric): Secondary | ICD-10-CM | POA: Insufficient documentation

## 2014-02-04 DIAGNOSIS — Z91199 Patient's noncompliance with other medical treatment and regimen due to unspecified reason: Secondary | ICD-10-CM | POA: Insufficient documentation

## 2014-02-04 DIAGNOSIS — Z860100 Personal history of colon polyps, unspecified: Secondary | ICD-10-CM | POA: Insufficient documentation

## 2014-02-04 DIAGNOSIS — I2510 Atherosclerotic heart disease of native coronary artery without angina pectoris: Secondary | ICD-10-CM | POA: Insufficient documentation

## 2014-02-04 DIAGNOSIS — I10 Essential (primary) hypertension: Secondary | ICD-10-CM | POA: Insufficient documentation

## 2014-02-04 DIAGNOSIS — I428 Other cardiomyopathies: Secondary | ICD-10-CM | POA: Insufficient documentation

## 2014-02-04 DIAGNOSIS — Z9119 Patient's noncompliance with other medical treatment and regimen: Secondary | ICD-10-CM | POA: Insufficient documentation

## 2014-02-04 DIAGNOSIS — R0683 Snoring: Secondary | ICD-10-CM

## 2014-02-04 DIAGNOSIS — R5383 Other fatigue: Secondary | ICD-10-CM

## 2014-02-04 DIAGNOSIS — E669 Obesity, unspecified: Secondary | ICD-10-CM | POA: Insufficient documentation

## 2014-02-04 DIAGNOSIS — R0989 Other specified symptoms and signs involving the circulatory and respiratory systems: Secondary | ICD-10-CM | POA: Insufficient documentation

## 2014-02-04 DIAGNOSIS — IMO0001 Reserved for inherently not codable concepts without codable children: Secondary | ICD-10-CM | POA: Insufficient documentation

## 2014-02-04 DIAGNOSIS — I69998 Other sequelae following unspecified cerebrovascular disease: Secondary | ICD-10-CM | POA: Insufficient documentation

## 2014-02-04 DIAGNOSIS — Z8249 Family history of ischemic heart disease and other diseases of the circulatory system: Secondary | ICD-10-CM | POA: Insufficient documentation

## 2014-02-04 DIAGNOSIS — Z794 Long term (current) use of insulin: Secondary | ICD-10-CM | POA: Insufficient documentation

## 2014-02-04 DIAGNOSIS — R5381 Other malaise: Secondary | ICD-10-CM | POA: Insufficient documentation

## 2014-02-04 DIAGNOSIS — I5022 Chronic systolic (congestive) heart failure: Secondary | ICD-10-CM

## 2014-02-04 MED ORDER — INSULIN NPH ISOPHANE & REGULAR (70-30) 100 UNIT/ML ~~LOC~~ SUSP: 40.0000 [IU] | Freq: Two times a day (BID) | SUBCUTANEOUS | Status: DC

## 2014-02-04 NOTE — Progress Notes (Signed)
Patient ID: Mitchell [name], male   DOB: [birth year], 43 y.o.   MRN: [id number]   PCP:Dr Caffie Damme [facility] Mental Health: Vesta Mixer  Pulmonologist: None  HPI:  Mr. Axley is a 43 year old male with multiple medical problems including obesity, uncontrolled hypertension, uncontrolled diabetes, obstructive sleep apnea, bipolar disease/schizophrenia, CVA, nonobstructive coronary disease and systolic congestive heart failure secondary to nonischemic cardiomyopathy.He is S/P multiple tooth extractions 01/27/14,   Admitted to [facility] 6/10 through 01/09/14 with uncontrolled diabetes. Started back on insulin. He was also referred to Dr Kristin Bruins for teeth extractions. Bidil stopped due to suspected syncope. Discharge weight 276 pounds.   He returns for post hospital follow up after multiple tooth extractions. Overall feeling better. Denies SOB/PND/Orthopnea. Taking all medications but not weighing.  He continues to perform mouth care as directed by Dr Kristin Bruins. Weight at home 288 pounds.   09/23/11: 2D echo checked with reduction in LVEF to 15-20% with mild to moderate mitral valve regurgitation ECHO 10/30/12 EF 20-25%  ECHO 11/22/13 EF 20-25%   Labs 01/09/14 Pro BNP 235  Labs 01/09/14 K 3.6 Creatinine 0.73  Labvs 01/28/14 K 4.2 Creatinine 0.89  SH: Lives at home with wife and children. Does not smoke or drink alcohol. FH: Mom had heart diease  ROS: As per HPI otherwise negative  Past Medical History  Diagnosis Date  . Hypertension   . Hyperlipidemia   . CVA (cerebral infarction)     No residual deficits  . Non-ischemic cardiomyopathy     No ischemia on myoview, showed EF of 42%. 2D echo showed EF 50-55% with diatolic dysfunction in 2009  . Post-cardiac injury syndrome     History of cardiac injury from blunt trauma  . PVCs (premature ventricular contractions)   . History of colonic polyps   . Obesity   . Syncope     Recurrent, thought to be vasovagal. Also has h/o frequent PVCs.   .  Chronic systolic heart failure 11/2010    a. NICM 4/14 EF 20-25%, TR mild  . SOB (shortness of breath)   . 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 Headache(784.0)   . Diabetes mellitus     TYPE II; UNCONTROLLED BY HEMOGLOBIN A1c; STABLE AS  PER DISCHARGE  . CHF (congestive heart failure)   . Myocardial infarction     at age 64 years old (while playing football)  . Stroke 2005    some left side weakness  . OSA (obstructive sleep apnea)     Being set up again for c-pap  . Pneumonia   . Depression     PTSD,   . Bipolar disorder   . Schizophrenia     Goes to [facility]    Current Outpatient Prescriptions  Medication Sig Dispense Refill  . acetaminophen (TYLENOL) 325 MG tablet Take 2 tablets (650 mg total) by mouth every 4 (four) hours as needed for headache or mild pain.  30 tablet  6  . aminocaproic acid (AMICAR) 5 % SOLN Take 10 mLs by mouth every hour.  1 Bottle  0  . amoxicillin (AMOXIL) 500 MG capsule Take 1 capsule (500 mg total) by mouth 3 (three) times daily.  30 capsule  0  . carvedilol (COREG) 25 MG tablet Take 1 tablet (25 mg total) by mouth 2 (two) times daily with a meal.  60 tablet  0  . digoxin (LANOXIN) 0.25 MG tablet Take 0.25 mg by mouth daily.      . furosemide (  LASIX) 40 MG tablet Take 1 tablet (40 mg total) by mouth 2 (two) times daily.  60 tablet  0  . insulin NPH-regular Human (NOVOLIN 70/30) (70-30) 100 UNIT/ML injection Inject 40 Units into the skin 2 (two) times daily with a meal.      . losartan (COZAAR) 100 MG tablet Take 50-100 mg by mouth 2 (two) times daily. Takes 100 mg in the morning and 50 mg in the evening      . magnesium oxide (MAG-OX) 400 MG tablet Take 400 mg by mouth daily.      [name] Kitchen. oxyCODONE-acetaminophen (PERCOCET/ROXICET) 5-325 MG per tablet Take 1-2 tablets by mouth every 4 (four) hours as needed for moderate pain.  30 tablet  0  . QUEtiapine (SEROQUEL) 100 MG tablet Take 1 tablet (100 mg total) by mouth at bedtime.  30 tablet  0   . sertraline (ZOLOFT) 100 MG tablet Take 1 tablet (100 mg total) by mouth 2 (two) times daily.  30 tablet  0  . spironolactone (ALDACTONE) 50 MG tablet Take 1 tablet (50 mg total) by mouth daily.  30 tablet  0   No current facility-administered medications for this encounter.    Allergies  Allergen Reactions  . Nsaids Anaphylaxis    PHYSICAL EXAM: Filed Vitals:   02/04/14 0903  BP: 137/84  Pulse: 86  Resp: 18  Weight: 292 lb 8 oz (132.677 kg)  SpO2: 99%    General:  Well appearing. No respiratory difficulty HEENT: normal Neck: supple. JVP flat Carotids 2+ bilat; no bruits. No lymphadenopathy or thryomegaly appreciated. Facial edema improved. Neck ecchymotic.  Cor: PMI nondisplaced. Tachycardic & regular rhythm. No rubs, or murmurs. No  S3 Lungs: clear Abdomen: Obese, soft, nontender, distended. No hepatosplenomegaly. No bruits or masses. Good bowel sounds. Extremities: no cyanosis, clubbing, rash, trace edema Neuro: alert & oriented x 3, cranial nerves grossly intact. moves all 4 extremities w/o difficulty. Affect pleasant.    ASSESSMENT & PLAN: 1) Chronic systolic HF, NICM ECHO 12/2013 EF 20-25% . He declines ICD. He understands he is at risk for sudden cardiac death.  - NYHA II. Volume status stable.  Continue lasix 40 mg twice a day and spironolactone 50 mg daily. Renal function ok from 01/28/14 . - On goal carvedilol to 25 mg twice a day. Continue digoxin 0.25 mg daily  - On goal losartan 100 mg in am and 50 mg in pm which will be goal  -Reinforced the need and importance of daily weights, a low sodium diet, and fluid restriction (less than 2 L a day). Instructed to call the HF clinic if weight increases more than 3 lbs overnight or 5 lbs in a week.  2) HTN-Stable. Continue current regimen . Did not have am medications.  3) DM - per PCP Has appointment July 8th 4) Noncompliance- Stressed the importance of medication compliance  5) Snores- Has referral to pulmonary for  sleep study.  Follow up in 3 months.   Annjanette Wertenberger NP-C 9:11 AM

## 2014-02-04 NOTE — Patient Instructions (Signed)
Follow up in 3 months   Do the following things EVERYDAY: 1) Weigh yourself in the morning before breakfast. Write it down and keep it in a log. 2) Take your medicines as prescribed 3) Eat low salt foods-Limit salt (sodium) to 2000 mg per day.  4) Stay as active as you can everyday 5) Limit all fluids for the day to less than 2 liters  

## 2014-02-05 ENCOUNTER — Ambulatory Visit (HOSPITAL_COMMUNITY): Payer: Medicaid - Dental | Admitting: Dentistry

## 2014-02-05 ENCOUNTER — Encounter (HOSPITAL_COMMUNITY): Payer: Self-pay | Admitting: Dentistry

## 2014-02-05 VITALS — BP 179/101 | HR 90 | Temp 98.7°F

## 2014-02-05 DIAGNOSIS — K08409 Partial loss of teeth, unspecified cause, unspecified class: Secondary | ICD-10-CM

## 2014-02-05 DIAGNOSIS — IMO0001 Reserved for inherently not codable concepts without codable children: Secondary | ICD-10-CM

## 2014-02-05 DIAGNOSIS — E1165 Type 2 diabetes mellitus with hyperglycemia: Secondary | ICD-10-CM

## 2014-02-05 DIAGNOSIS — K000 Anodontia: Secondary | ICD-10-CM

## 2014-02-05 DIAGNOSIS — K08109 Complete loss of teeth, unspecified cause, unspecified class: Secondary | ICD-10-CM

## 2014-02-05 NOTE — Patient Instructions (Addendum)
PLAN: 1. Complete amoxicillin therapy as directed. 2. Use salt water rinses every 2 hours while awake. 3. Brush tongue daily. 4. Maintain soft diet. 5. Return to clinic in 2 weeks for reevaluation of healing. 6. Follow up with dentist of his choice for fabrication of upper lower complete dentures after adequate healing.  Dr. Kristin Bruins

## 2014-02-05 NOTE — Progress Notes (Signed)
POST OPERATIVE NOTE:  02/05/2014 Mitchell [name] [id number]  VITALS: BP 179/101  Pulse 90  Temp(Src) 98.7 F (37.1 C) (Oral)  LABS:  Lab Results  Component Value Date   WBC 14.2* 01/27/2014   HGB 12.6* 01/27/2014   HCT 38.3* 01/27/2014   MCV 89.3 01/27/2014   PLT 155 01/27/2014   BMET    Component Value Date/Time   NA 140 01/28/2014 0510   K 4.2 01/28/2014 0510   CL 97 01/28/2014 0510   CO2 30 01/28/2014 0510   GLUCOSE 293* 01/28/2014 0510   BUN 15 01/28/2014 0510   CREATININE 0.89 01/28/2014 0510   CALCIUM 8.9 01/28/2014 0510   GFRNONAA >90 01/28/2014 0510   GFRAA >90 01/28/2014 0510    Lab Results  Component Value Date   INR 1.07 12/19/2010   INR 1.0 09/19/2007   INR 1.0 06/08/2007   No results found for this basename: PTT     Carnel L Signer  is status post extraction of remaining teeth with alveoloplasty and pre-prosthetic surgery as indicated in the operating room on January 27, 2014.   SUBJECTIVE: Patient with some discomfort from extraction sites. Several areas are "tender". Patient indicates that has been using some saltwater rinses to aid healing.  Patient is still taking his amoxicillin antibiotic therapy and will complete therapy over the next day.  EXAM:  There is no sign of infection, heme, or ooze. Sutures are loosely intact. There is generalized primary closure but multiple areas that we'll need to heal in by secondary intention especially involving the upper right and upper left posterior molar areas.  PROCEDURE: The patient was given a chlorhexidine gluconate rinse for 30 seconds. Sutures were then removed without complication. Patient tolerated the procedure well.  ASSESSMENT: Post operative course is consistent with dental procedures performed in the Operating room. The patient is now edentulous.  PLAN: 1. Complete amoxicillin therapy as directed. 2. Use salt water rinses every 2 hours while awake. 3. Brush tongue daily. 4. Maintain soft diet. 5. Return to  clinic in 2 weeks for reevaluation of healing. 6. Follow up with dentist of his choice for fabrication of upper lower complete dentures after adequate healing.  Charlynne Pander, DDS

## 2014-02-07 ENCOUNTER — Encounter (HOSPITAL_BASED_OUTPATIENT_CLINIC_OR_DEPARTMENT_OTHER): Payer: Self-pay | Admitting: Emergency Medicine

## 2014-02-07 ENCOUNTER — Emergency Department (HOSPITAL_BASED_OUTPATIENT_CLINIC_OR_DEPARTMENT_OTHER)
Admission: EM | Admit: 2014-02-07 | Discharge: 2014-02-07 | Disposition: A | Discharge status: Home / Self Care | Payer: No Typology Code available for payment source | Attending: Emergency Medicine | Admitting: Emergency Medicine

## 2014-02-07 DIAGNOSIS — Z9889 Other specified postprocedural states: Secondary | ICD-10-CM | POA: Insufficient documentation

## 2014-02-07 DIAGNOSIS — E119 Type 2 diabetes mellitus without complications: Secondary | ICD-10-CM | POA: Insufficient documentation

## 2014-02-07 DIAGNOSIS — Z8673 Personal history of transient ischemic attack (TIA), and cerebral infarction without residual deficits: Secondary | ICD-10-CM | POA: Insufficient documentation

## 2014-02-07 DIAGNOSIS — Z8701 Personal history of pneumonia (recurrent): Secondary | ICD-10-CM | POA: Insufficient documentation

## 2014-02-07 DIAGNOSIS — E669 Obesity, unspecified: Secondary | ICD-10-CM | POA: Insufficient documentation

## 2014-02-07 DIAGNOSIS — Z8669 Personal history of other diseases of the nervous system and sense organs: Secondary | ICD-10-CM | POA: Insufficient documentation

## 2014-02-07 DIAGNOSIS — Z860100 Personal history of colon polyps, unspecified: Secondary | ICD-10-CM | POA: Insufficient documentation

## 2014-02-07 DIAGNOSIS — Z792 Long term (current) use of antibiotics: Secondary | ICD-10-CM | POA: Insufficient documentation

## 2014-02-07 DIAGNOSIS — K1379 Other lesions of oral mucosa: Secondary | ICD-10-CM

## 2014-02-07 DIAGNOSIS — Z79899 Other long term (current) drug therapy: Secondary | ICD-10-CM | POA: Insufficient documentation

## 2014-02-07 DIAGNOSIS — I10 Essential (primary) hypertension: Secondary | ICD-10-CM | POA: Insufficient documentation

## 2014-02-07 DIAGNOSIS — Z794 Long term (current) use of insulin: Secondary | ICD-10-CM | POA: Insufficient documentation

## 2014-02-07 DIAGNOSIS — I5022 Chronic systolic (congestive) heart failure: Secondary | ICD-10-CM | POA: Insufficient documentation

## 2014-02-07 DIAGNOSIS — I252 Old myocardial infarction: Secondary | ICD-10-CM | POA: Insufficient documentation

## 2014-02-07 DIAGNOSIS — Z8619 Personal history of other infectious and parasitic diseases: Secondary | ICD-10-CM | POA: Insufficient documentation

## 2014-02-07 DIAGNOSIS — F319 Bipolar disorder, unspecified: Secondary | ICD-10-CM | POA: Insufficient documentation

## 2014-02-07 DIAGNOSIS — K1370 Unspecified lesions of oral mucosa: Secondary | ICD-10-CM | POA: Insufficient documentation

## 2014-02-07 DIAGNOSIS — F209 Schizophrenia, unspecified: Secondary | ICD-10-CM | POA: Insufficient documentation

## 2014-02-07 MED ORDER — CARVEDILOL 25 MG PO TABS: 25.0000 mg | ORAL_TABLET | Freq: Two times a day (BID) | ORAL | Status: DC

## 2014-02-07 MED ORDER — CLINDAMYCIN HCL 150 MG PO CAPS: 150.0000 mg | ORAL_CAPSULE | Freq: Four times a day (QID) | ORAL | Status: DC

## 2014-02-07 MED ORDER — OXYCODONE-ACETAMINOPHEN 5-325 MG PO TABS: 2.0000 | ORAL_TABLET | ORAL | Status: DC | PRN

## 2014-02-07 NOTE — ED Provider Notes (Signed)
CSN: [id number]     Arrival date & time 02/07/14  1702 History   First MD Initiated Contact with Patient 02/07/14 1720     Chief Complaint  Patient presents with  . Dental Pain     (Consider location/radiation/quality/duration/timing/severity/associated sxs/prior Treatment) Patient is a 43 y.o. male presenting with tooth pain. The history is provided by the patient. No language interpreter was used.  Dental Pain Location:  Upper and lower Quality:  Aching Severity:  Moderate Onset quality:  Gradual Timing:  Constant Progression:  Worsening Chronicity:  New Relieved by:  Nothing Worsened by:  Nothing tried Pt had teeth extracted (full mouth)   Pt complains of continued pain.     Past Medical History  Diagnosis Date  . Hypertension   . Hyperlipidemia   . CVA (cerebral infarction)     No residual deficits  . Non-ischemic cardiomyopathy     No ischemia on myoview, showed EF of 42%. 2D echo showed EF 50-55% with diatolic dysfunction in 2009  . Post-cardiac injury syndrome     History of cardiac injury from blunt trauma  . PVCs (premature ventricular contractions)   . History of colonic polyps   . Obesity   . Syncope     Recurrent, thought to be vasovagal. Also has h/o frequent PVCs.   . Chronic systolic heart failure 11/2010    a. NICM 4/14 EF 20-25%, TR mild  . SOB (shortness of breath)   . 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 Headache(784.0)   . Diabetes mellitus     TYPE II; UNCONTROLLED BY HEMOGLOBIN A1c; STABLE AS  PER DISCHARGE  . CHF (congestive heart failure)   . Myocardial infarction     at age 43 years old (while playing football)  . Stroke 2005    some left side weakness  . OSA (obstructive sleep apnea)     Being set up again for c-pap  . Pneumonia   . Depression     PTSD,   . Bipolar disorder   . Schizophrenia     [facility]   Past Surgical History  Procedure Laterality Date  . Cardiac catheterization  12/19/10    DIFFUSE  NONOBSTRUCTIVE CAD; NONISCHEMIC CARDIOMYOPATHY; LEFT VENTRICULAR ANGIOGRAM WAS PERFORMED SECONDARY TO  ELEVATED LEFT VENTRICULAR FILLING PRESSURES  . Orif finger / thumb fracture Right   . Colonoscopy w/ polypectomy    . Multiple extractions with alveoloplasty  01/27/2014    "all my teeth; 4 Quadrants of alveoloplasty  . Multiple extractions with alveoloplasty N/A 01/27/2014    Procedure: EXTRACTION OF TEETH #'1, 2, 3, 4, 5, 6, 7, 8, 9, 10, 11, 12, 13, 14, 15, 16, 17, 20, 21, 22, 23, 24, 25, 26, 27, 28, 29, 31 and 32 WITH ALVEOLOPLASTY;  Surgeon: Charlynne Panderonald F Kulinski, DDS;  Location: [facility] OR;  Service: Oral Surgery;  Laterality: N/A;   Family History  Problem Relation Age of Onset  . Heart disease Mother     MI  . Heart failure Mother   . Diabetes Mother     ALSO IN MOST OF HIS SIBLINGS; 2 UNLCES HAVE ALSO PASSED AWAY FROM DM  . Cardiomyopathy Mother   . Cancer - Ovarian Mother   . Heart disease Father   . Hypertension Father   . Diabetes Father    History  Substance Use Topics  . Smoking status: Never Smoker   . Smokeless tobacco: Never Used  . Alcohol Use: No  Review of Systems  All other systems reviewed and are negative.     Allergies  Nsaids  Home Medications   Prior to Admission medications   Medication Sig Start Date End Date Taking? Authorizing Provider  acetaminophen (TYLENOL) 325 MG tablet Take 2 tablets (650 mg total) by mouth every 4 (four) hours as needed for headache or mild pain. 01/28/14   Amy D Clegg, NP  aminocaproic acid (AMICAR) 5 % SOLN Take 10 mLs by mouth every hour. 01/28/14   Amy D Filbert Schilder, NP  amoxicillin (AMOXIL) 500 MG capsule Take 1 capsule (500 mg total) by mouth 3 (three) times daily. 01/28/14   Amy D Filbert Schilder, NP  carvedilol (COREG) 25 MG tablet Take 1 tablet (25 mg total) by mouth 2 (two) times daily with a meal. 02/07/14   Elson Areas, PA-C  clindamycin (CLEOCIN) 150 MG capsule Take 1 capsule (150 mg total) by mouth every 6 (six) hours. 02/07/14    Elson Areas, PA-C  digoxin (LANOXIN) 0.25 MG tablet Take 0.25 mg by mouth daily.    Historical Provider, MD  furosemide (LASIX) 40 MG tablet Take 1 tablet (40 mg total) by mouth 2 (two) times daily. 11/25/13   Renae Fickle, MD  insulin NPH-regular Human (NOVOLIN 70/30) (70-30) 100 UNIT/ML injection Inject 40 Units into the skin 2 (two) times daily with a meal. 02/04/14   Amy D Clegg, NP  losartan (COZAAR) 100 MG tablet Take 50-100 mg by mouth 2 (two) times daily. Takes 100 mg in the morning and 50 mg in the evening    Historical Provider, MD  magnesium oxide (MAG-OX) 400 MG tablet Take 400 mg by mouth daily.    Historical Provider, MD  oxyCODONE-acetaminophen (PERCOCET/ROXICET) 5-325 MG per tablet Take 1-2 tablets by mouth every 4 (four) hours as needed for moderate pain. 01/28/14   Amy D Filbert Schilder, NP  oxyCODONE-acetaminophen (PERCOCET/ROXICET) 5-325 MG per tablet Take 2 tablets by mouth every 4 (four) hours as needed for severe pain. 02/07/14   Elson Areas, PA-C  QUEtiapine (SEROQUEL) 100 MG tablet Take 1 tablet (100 mg total) by mouth at bedtime. 11/25/13   Renae Fickle, MD  sertraline (ZOLOFT) 100 MG tablet Take 1 tablet (100 mg total) by mouth 2 (two) times daily. 11/25/13   Renae Fickle, MD  spironolactone (ALDACTONE) 50 MG tablet Take 1 tablet (50 mg total) by mouth daily. 11/25/13 11/25/14  Renae Fickle, MD   BP 178/101  Pulse 95  Temp(Src) 98.7 F (37.1 C) (Oral)  Resp 18  SpO2 100% Physical Exam  Nursing note and vitals reviewed. Constitutional: He is oriented to person, place, and time. He appears well-developed and well-nourished.  HENT:  Head: Normocephalic.  Right Ear: External ear normal.  Left Ear: External ear normal.  Mouth multiple healing extractions sites,  White exudate back sites  Eyes: Pupils are equal, round, and reactive to light.  Neck: Normal range of motion.  Musculoskeletal: Normal range of motion.  Neurological: He is alert and oriented to person,  place, and time. He has normal reflexes.  Skin: Skin is warm.  Psychiatric: He has a normal mood and affect.    ED Course  Procedures (including critical care time) Labs Review Labs Reviewed - No data to display  Imaging Review No results found.   EKG Interpretation None      MDM   Final diagnoses:  Mouth pain    Clindamycin hydrocodone    Elson Areas, PA-C 02/07/14 1748

## 2014-02-07 NOTE — ED Provider Notes (Signed)
Medical screening examination/treatment/procedure(s) were performed by non-physician practitioner and as supervising physician I was immediately available for consultation/collaboration.    Linwood Dibbles, MD 02/07/14 1750

## 2014-02-07 NOTE — Discharge Instructions (Signed)
Dental Extraction A dental extraction procedure refers to a routine tooth extraction performed by your dentist. The procedure depends on where and how the tooth is positioned. The procedure can be very quick, sometimes lasting only seconds. Reasons for dental extraction include:  Tooth decay.  Infections (abcesses).  The need to make room for other teeth.  Gum disease s where the supporting bone has been destroyed.  Fractures of the tooth leaving it unrestorable.  Extra teeth (supernumerary) or grossly malformed teeth.  Baby teeththat have not fallen out in time and have not permitted the the permanent teeth to erupt properly.  In preparation for braces where there is not enough room to align the teeth properly.  Not enough room for wisdom teeth (particularly those that are impacted).  Prior to receiving radiation to the head and neck,teeth in the field of radiation may need to be extracted. LET YOUR CAREGIVER KNOW ABOUT:  Any allergies.  All medicines you are taking:  Including herbs, eye drops, over-the-counter medications, and creams.  Blood thinners (anticoagulants), aspirin or other drugs that may affect blood clotting.  Use of steroids (through mouth or as creams).  Previous problems with anesthetics, including local anesthetics.  History of bleeding or blood problems.  Previous surgery.  Possibility of pregnancy if this applies.  Smoking history.  Any health problems. RISKS AND COMPLICATIONS As with any procedure, complications may occur, but they can usually be managed by your caregiver. General surgical complications may include:  Reaction to anesthesia.  Damage to surrounding teeth, nerves, tissues, or structures.  Infection.  Bleeding. With appropriate treatment and care after surgery, the following complications are very uncommon:  Dry socket (blood clot does not form or stay in place over empty socket). This can delay healing.  Incomplete  extraction of roots.  Jawbone injury, pain, or weakness. BEFORE THE PROCEDURE  Your dental care provider will:  Take a medical and dental history.  Take an X-ray to evaluate the circumstances and how to best extract the tooth.  Do an oral exam.  Depending on the situation, antibiotics may be given before or after the extraction, or before and after.  Your caregivers may review the procedure, the local anaesthesia and/or sedation being used, and what to expect after the procedure with you.  If needed, your dentist may give you a form of sedation, either by medicine you swallow, gas, or intravenously (IV). This will help to relieve anxiety. Complicated extractions may require the use of general anaesthesia. It is important to follow your caregiver's instructions prior to your procedure to avoid complications. Steps before your procedure may include:  Alert your caregiver if you feel ill (sore throat, fever, upset stomach, etc.) in the days leading up to your procedure.  Stop taking certain medications for several days prior to your procedure such as blood thinners.  Take certain medications, such as antibiotics.  Avoid eating and drinking for several hours before the procedure. This will help you to avoid complications from the sedation or anaesthesia.  Sign a patient consent form.  Have a friend or family member drive you to the dentist and drive you home after the procedure.  Wear comfortable, loose clothing. Limit makeup and jewelry.  Quit smoking. If you are a smoker, this will raise the chances of a healing problem after your procedure. If you are thinking about quitting, talk to your surgeon about how long before the operation you should stop smoking. You may also get help from your primary caregiver. PROCEDURE  Dental extraction is typically done as an outpatient procedure. IV sedation, local anesthesia, or both may be used. It will keep you comfortable and free of pain  during the procedure.  There are 2 types of extractions:  Simple extraction involves a tooth that is visible in the mouth and above the gum line. After local anesthetic is given by injection, and the area is numbed, the dentist will loosen the tooth with a special instrument (elevator). Then another instrument (forceps) will be used to grasp the tooth and remove it from its socket. During the procedure you will feel some pressure, but you should not feel pain. If you do feel pain, tell your dentist. The open socket will be cleaned. Dressings (gauze) will be placed in the socket to reduce bleeding.  Surgical extractions are used if the tooth has not come into the mouth or the tooth is broken off below the gum line. The dentist will make a cut (incision) in the gum and may have to remove some of the bone around the tooth to aid in the removal of the tooth. After removal, stitches (sutures) may be required to close area to help in healing and control bleeding. For some surgical extractions, you may need a general anesthetic or IV sedation (through the vein). After both types of extractions, you may be given pain medication or other drugs to help healing. Other post operative instructions will be given by your dental caregiver. AFTER THE PROCEDURE  You will have gauze in your mouth where the tooth was removed. Gentle pressure on the gauze for up to 1 hour will help to control bleeding.  A blood clot will begin to form over the open socket. This is normal. Do not touch the area or rinse it.  Your pain will be controlled with medication and self-care.  You will be given detailed instructions for care after surgery. PROGNOSIS While some discomfort is normal after tooth extraction, most patients recover fully in just a few days. SEEK IMMEDIATE DENTAL CARE  You have uncontrolled bleeding, marked swelling, or severe pain.  You develop a fever, difficulty swallowing, or other severe symptoms.  You have  questions or concerns. Document Released: 07/17/2005 Document Revised: 10/09/2011 Document Reviewed: 10/21/2010 [facility] Patient Information 2015 [place] [place]. This information is not intended to replace advice given to you by your health care provider. Make sure you discuss any questions you have with your health care provider.

## 2014-02-07 NOTE — ED Notes (Signed)
Pt had all of his teeth removed surgically on 02-05-14.  Pt reports pain since that time and is out of HTN and pain medication.

## 2014-02-17 ENCOUNTER — Ambulatory Visit (HOSPITAL_COMMUNITY): Payer: Medicaid - Dental | Admitting: Dentistry

## 2014-02-17 ENCOUNTER — Encounter (HOSPITAL_COMMUNITY): Payer: Self-pay | Admitting: Dentistry

## 2014-02-17 VITALS — BP 194/131 | HR 105 | Temp 98.9°F

## 2014-02-17 DIAGNOSIS — K08109 Complete loss of teeth, unspecified cause, unspecified class: Secondary | ICD-10-CM

## 2014-02-17 DIAGNOSIS — E1165 Type 2 diabetes mellitus with hyperglycemia: Secondary | ICD-10-CM

## 2014-02-17 DIAGNOSIS — K08409 Partial loss of teeth, unspecified cause, unspecified class: Secondary | ICD-10-CM

## 2014-02-17 DIAGNOSIS — K000 Anodontia: Principal | ICD-10-CM

## 2014-02-17 DIAGNOSIS — IMO0001 Reserved for inherently not codable concepts without codable children: Secondary | ICD-10-CM

## 2014-02-17 MED ORDER — OXYCODONE-ACETAMINOPHEN 5-325 MG PO TABS: 1.0000 | ORAL_TABLET | Freq: Four times a day (QID) | ORAL | Status: DC | PRN

## 2014-02-17 NOTE — Patient Instructions (Signed)
PLAN: 1. Complete clindamycin antibiotic therapy as prescribed by his physician.  2. Use salt water rinses every 2 hours while awake. 3. Brush tongue daily. 4. Maintain soft diet. 5. Percocet 5/325 #30. Take one to 2 tablets every 6 hours needed for pain was prescribed to the patient today.  6. Patient is return to clinic on August 10 for start of upper and lower complete denture fabrication. Quote for the upper lower complete dentures was provided today.   Charlynne Pander, DDS

## 2014-02-17 NOTE — Progress Notes (Signed)
.   Re-evaluation of healing-S/P total odontectomy with alveoloplasty and pre-prosthetic surgery  02/17/2014 [facility] [id number]  VITALS: BP 194/131  Pulse 105  Temp(Src) 98.9 F (37.2 C) (Oral)  LABS:  Lab Results  Component Value Date   WBC 14.2* 01/27/2014   HGB 12.6* 01/27/2014   HCT 38.3* 01/27/2014   MCV 89.3 01/27/2014   PLT 155 01/27/2014   BMET    Component Value Date/Time   NA 140 01/28/2014 0510   K 4.2 01/28/2014 0510   CL 97 01/28/2014 0510   CO2 30 01/28/2014 0510   GLUCOSE 293* 01/28/2014 0510   BUN 15 01/28/2014 0510   CREATININE 0.89 01/28/2014 0510   CALCIUM 8.9 01/28/2014 0510   GFRNONAA >90 01/28/2014 0510   GFRAA >90 01/28/2014 0510    Lab Results  Component Value Date   INR 1.07 12/19/2010   INR 1.0 09/19/2007   INR 1.0 06/08/2007   No results found for this basename: PTT     Amdrew L Wurm  is status post extraction of remaining teeth with alveoloplasty and pre-prosthetic surgery as indicated in the operating room on January 27, 2014. Patient was seen for followup evaluation on 02/05/2014. Sutures removed at that time. Several areas of delayed healing were noted at that time and patient was scheduled for followup evaluation for today.    SUBJECTIVE: Patient with some persistent discomfort from extraction sites. Several areas are still "tender". Patient indicates that has been using salt water rinses to aid healing.  Patient completed his amoxicillin antibiotic therapy but was recently started on clindamycin antibiotic therapy by another physician. Patient was instructed to take all his medication as prescribed. Patient was informed of his elevated blood pressure and indicates that he has not been taking all his blood pressure medications due to running out of medication. I  highly suggested going to the emergency department for evaluation and treatment. Patient refused at this time. Patient denied having any acute symptoms of headache, dizziness, or significant  chest discomfort today.  EXAM: There is no sign of infection, heme, or ooze. There is generalized primary closure and most of the upper right and upper left molar sites are healing in well by secondary intention with extensive soft tissue coverage.  ASSESSMENT: Post operative course is consistent with dental procedures performed in the Operating room. The patient is now edentulous. The patient is continuing to heal from the extractions. Patient still has some persistent discomfort from the extraction sites.  PLAN: 1. Complete clindamycin antibiotic therapy as prescribed by his physician.  2. Use salt water rinses every 2 hours while awake. 3. Brush tongue daily. 4. Maintain soft diet. 5. Percocet 5/325 #30. Take one to 2 tablets every 6 hours needed for pain was prescribed to the patient today.  6. Patient is return to clinic on August 10 for start of upper and lower complete denture fabrication. Quote for the upper lower complete dentures was provided today.   Charlynne Pander, DDS

## 2014-02-18 ENCOUNTER — Institutional Professional Consult (permissible substitution): Payer: No Typology Code available for payment source | Admitting: Pulmonary Disease

## 2014-02-23 ENCOUNTER — Encounter (HOSPITAL_COMMUNITY): Payer: No Typology Code available for payment source

## 2014-03-09 ENCOUNTER — Encounter (INDEPENDENT_AMBULATORY_CARE_PROVIDER_SITE_OTHER): Payer: Self-pay

## 2014-03-09 ENCOUNTER — Ambulatory Visit (HOSPITAL_COMMUNITY): Payer: Medicaid - Dental | Admitting: Dentistry

## 2014-03-09 ENCOUNTER — Encounter (HOSPITAL_COMMUNITY): Payer: Self-pay | Admitting: Dentistry

## 2014-03-09 VITALS — BP 169/87 | HR 93 | Temp 98.5°F

## 2014-03-09 DIAGNOSIS — E1165 Type 2 diabetes mellitus with hyperglycemia: Principal | ICD-10-CM

## 2014-03-09 DIAGNOSIS — K0820 Unspecified atrophy of edentulous alveolar ridge: Secondary | ICD-10-CM

## 2014-03-09 DIAGNOSIS — Z463 Encounter for fitting and adjustment of dental prosthetic device: Secondary | ICD-10-CM

## 2014-03-09 DIAGNOSIS — IMO0001 Reserved for inherently not codable concepts without codable children: Secondary | ICD-10-CM

## 2014-03-09 DIAGNOSIS — K000 Anodontia: Secondary | ICD-10-CM

## 2014-03-09 DIAGNOSIS — K08109 Complete loss of teeth, unspecified cause, unspecified class: Secondary | ICD-10-CM

## 2014-03-09 NOTE — Progress Notes (Signed)
03/09/2014  Patient:            Mitchell [name] Date of Birth:  January 31, 1971 MRN:                [id number]  BP 169/87  Pulse 93  Temp(Src) 98.5 F (36.9 C) (Oral)   Myer Haff [place] presents for start of upper and lower denture fabrication. Exam: Patient is edentulous. Discussed procedures involved in upper and lower denture fabrication and prognosis for successful ability to wear dentures. Price for dentures confirmed.  Patient agrees to proceed with upper and lower denture fabrication. Procedure:  Upper and lower denture primary impressions in alginate. Lab pour. To [employer] for upper and lower denture custom tray fabrication. RTC for upper and lower denture final impressions.  Charlynne Pander, DDS

## 2014-03-09 NOTE — Patient Instructions (Signed)
Return to clinic as scheduled for continued fabrication of upper and lower complete dentures. Dr. Anniece Bleiler 

## 2014-03-17 ENCOUNTER — Encounter: Payer: Self-pay | Admitting: Pulmonary Disease

## 2014-03-17 ENCOUNTER — Encounter (HOSPITAL_COMMUNITY): Payer: Self-pay | Admitting: Dentistry

## 2014-03-17 ENCOUNTER — Ambulatory Visit (INDEPENDENT_AMBULATORY_CARE_PROVIDER_SITE_OTHER): Payer: No Typology Code available for payment source | Admitting: Pulmonary Disease

## 2014-03-17 ENCOUNTER — Ambulatory Visit (HOSPITAL_COMMUNITY): Payer: Medicaid - Dental | Admitting: Dentistry

## 2014-03-17 VITALS — BP 158/84 | HR 90 | Temp 98.5°F

## 2014-03-17 VITALS — BP 130/82 | HR 84 | Temp 98.6°F | Ht 72.0 in | Wt 285.0 lb

## 2014-03-17 DIAGNOSIS — K08109 Complete loss of teeth, unspecified cause, unspecified class: Secondary | ICD-10-CM

## 2014-03-17 DIAGNOSIS — Z463 Encounter for fitting and adjustment of dental prosthetic device: Secondary | ICD-10-CM

## 2014-03-17 DIAGNOSIS — K000 Anodontia: Principal | ICD-10-CM

## 2014-03-17 DIAGNOSIS — K0820 Unspecified atrophy of edentulous alveolar ridge: Secondary | ICD-10-CM

## 2014-03-17 DIAGNOSIS — G4733 Obstructive sleep apnea (adult) (pediatric): Secondary | ICD-10-CM

## 2014-03-17 NOTE — Progress Notes (Signed)
Subjective:    Patient ID: Mitchell [name], male    DOB: August 15, 1970, 43 y.o.   MRN: [id number]  HPI The patient is a 43 year old male who I've been asked to see for management of obstructive sleep apnea. He was first diagnosed in 2003 with mild OSA, with an AHI of 6 events per hour. I saw him in 2009, and he underwent a CPAP titration study with optimal pressure being 8 cm. He tells me that he wore the CPAP for about one year, and then it "broke".  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 He just quit using CPAP. He comes in today where he has had worsening sleep and increased sleepiness over the years. He still has loud snoring as well as an abnormal breathing pattern during sleep and gasping arousals. He is not rested in the mornings upon arising, and notes significant daytime sleepiness with inactivity. The patient states that his weight is neutral over the last few years, but it appears that he has lost 30 pounds since his initial study in 2003. His Epworth score today is 14.   Sleep Questionnaire What time do you typically go to bed?( Between what hours) 10-11pm 10-11pm at 1012 on 03/17/14 by Darrell Jewel, CMA How long does it take you to fall asleep? 1 hour 1 hour at 1012 on 03/17/14 by Darrell Jewel, CMA How many times during the night do you wake up? 3 3 at 1012 on 03/17/14 by Darrell Jewel, CMA What time do you get out of bed to start your day? 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 Yes Yes at 1012 on 03/17/14 by Darrell Jewel, CMA How much has your weight changed (up or down) over the past two years? (In pounds) 4 lb (1.814 kg) 4 lb (1.814 kg) at 1012 on 03/17/14 by Darrell Jewel, CMA Have you ever had a sleep study before? Yes Yes at 1012 on 03/17/14 by Darrell Jewel, CMA If yes, location of study?  [facility] at 1012 on 03/17/14 by Darrell Jewel, CMA If yes, date of study? 2009 2009 at 1012 on 03/17/14 by Darrell Jewel, CMA Do you currently use CPAP? No No at 1012 on 03/17/14 by Darrell Jewel, CMA Do you wear oxygen at any time? No No at 1012 on 03/17/14 by Darrell Jewel, CMA   Review of Systems  Constitutional: Negative for fever and unexpected weight change.  HENT: Positive for dental problem. Negative for congestion, ear pain, nosebleeds, postnasal drip, rhinorrhea, sinus pressure, sneezing, sore throat and trouble swallowing.   Eyes: Negative for redness and itching.  Respiratory: Positive for shortness of breath. Negative for cough, chest tightness and wheezing.   Cardiovascular: Negative for palpitations and leg swelling.  Gastrointestinal: Negative for nausea and vomiting.  Genitourinary: Negative for dysuria.  Musculoskeletal: Negative for joint swelling.  Skin: Negative for rash.  Neurological: Negative for headaches.  Hematological: Does not bruise/bleed easily.  Psychiatric/Behavioral: Positive for dysphoric mood. The patient is not nervous/anxious.        Objective:   Physical Exam Constitutional:  Obese male, no acute distress  HENT:  Nares patent without discharge  Oropharynx without exudate, palate and uvula are thickened and elongated.  Eyes:  Perrla, eomi, no scleral icterus  Neck:  No JVD, no TMG  Cardiovascular:  Normal rate, regular rhythm, no rubs or gallops.  No murmurs        Intact distal pulses but decreased.  Pulmonary :  Normal breath sounds, no stridor or respiratory distress   No rales, rhonchi, or wheezing  Abdominal:  Soft, nondistended, bowel sounds present.  No tenderness noted.   Musculoskeletal:  1+ lower extremity edema noted.  Lymph Nodes:  No cervical lymphadenopathy noted  Skin:  No cyanosis noted  Neurologic:  Appears sleepy but appropriate, moves all 4 extremities without  obvious deficit.         Assessment & Plan:

## 2014-03-17 NOTE — Progress Notes (Signed)
03/17/2014  Patient:            Mitchell [name] Date of Birth:  24-Sep-1970 MRN:                [id number]  BP 158/84  Pulse 90  Temp(Src) 98.5 F (36.9 C) (Oral)  Mitchell [name] presents for continued upper and lower complete denture fabrication. Procedure:  Upper and lower border molding and final impressions in Aquasil. Patient tolerated procedure well. To [employer] for custom baseplates with rims. Return to clinic for upper and lower complete denture jaw relations.  Charlynne Pander, DDS

## 2014-03-17 NOTE — Patient Instructions (Signed)
Will schedule for a sleep study, and will call once the results are available. Work on weight loss.

## 2014-03-17 NOTE — Patient Instructions (Signed)
Return to clinic as scheduled for continued upper and lower complete denture fabrication. Dr. Danner Paulding 

## 2014-03-17 NOTE — Assessment & Plan Note (Signed)
The patient has a history of mild sleep apnea dating back to 2003, and did wear CPAP until his machine quit functioning in 2010. He has not been on CPAP since that time, and is continuing to have symptoms that are classic for sleep disordered breathing. He will need to have a followup sleep study done, followed by reinitiation of CPAP if he still and it has obstructive sleep apnea. I've also encouraged him to work aggressively on weight loss.

## 2014-03-25 ENCOUNTER — Ambulatory Visit (HOSPITAL_BASED_OUTPATIENT_CLINIC_OR_DEPARTMENT_OTHER): Payer: No Typology Code available for payment source | Attending: Pulmonary Disease | Admitting: Radiology

## 2014-03-25 ENCOUNTER — Emergency Department (HOSPITAL_COMMUNITY)
Admission: EM | Admit: 2014-03-25 | Discharge: 2014-03-25 | Disposition: A | Discharge status: Home / Self Care | Payer: No Typology Code available for payment source | Attending: Emergency Medicine | Admitting: Emergency Medicine

## 2014-03-25 ENCOUNTER — Emergency Department (HOSPITAL_COMMUNITY): Payer: No Typology Code available for payment source

## 2014-03-25 ENCOUNTER — Encounter (HOSPITAL_COMMUNITY): Payer: Self-pay | Admitting: Emergency Medicine

## 2014-03-25 VITALS — Ht 72.0 in | Wt 285.0 lb

## 2014-03-25 DIAGNOSIS — Z794 Long term (current) use of insulin: Secondary | ICD-10-CM | POA: Diagnosis not present

## 2014-03-25 DIAGNOSIS — Z8701 Personal history of pneumonia (recurrent): Secondary | ICD-10-CM | POA: Diagnosis not present

## 2014-03-25 DIAGNOSIS — Z79899 Other long term (current) drug therapy: Secondary | ICD-10-CM | POA: Diagnosis not present

## 2014-03-25 DIAGNOSIS — E669 Obesity, unspecified: Secondary | ICD-10-CM | POA: Insufficient documentation

## 2014-03-25 DIAGNOSIS — IMO0002 Reserved for concepts with insufficient information to code with codable children: Secondary | ICD-10-CM | POA: Diagnosis not present

## 2014-03-25 DIAGNOSIS — W050XXA Fall from non-moving wheelchair, initial encounter: Secondary | ICD-10-CM | POA: Insufficient documentation

## 2014-03-25 DIAGNOSIS — Z8669 Personal history of other diseases of the nervous system and sense organs: Secondary | ICD-10-CM | POA: Diagnosis not present

## 2014-03-25 DIAGNOSIS — S99919A Unspecified injury of unspecified ankle, initial encounter: Secondary | ICD-10-CM

## 2014-03-25 DIAGNOSIS — I252 Old myocardial infarction: Secondary | ICD-10-CM | POA: Diagnosis not present

## 2014-03-25 DIAGNOSIS — Z860100 Personal history of colon polyps, unspecified: Secondary | ICD-10-CM | POA: Insufficient documentation

## 2014-03-25 DIAGNOSIS — R400 Somnolence: Secondary | ICD-10-CM

## 2014-03-25 DIAGNOSIS — R404 Transient alteration of awareness: Secondary | ICD-10-CM | POA: Insufficient documentation

## 2014-03-25 DIAGNOSIS — Y9289 Other specified places as the place of occurrence of the external cause: Secondary | ICD-10-CM | POA: Diagnosis not present

## 2014-03-25 DIAGNOSIS — Z8673 Personal history of transient ischemic attack (TIA), and cerebral infarction without residual deficits: Secondary | ICD-10-CM | POA: Insufficient documentation

## 2014-03-25 DIAGNOSIS — I5022 Chronic systolic (congestive) heart failure: Secondary | ICD-10-CM | POA: Insufficient documentation

## 2014-03-25 DIAGNOSIS — S8990XA Unspecified injury of unspecified lower leg, initial encounter: Secondary | ICD-10-CM | POA: Diagnosis present

## 2014-03-25 DIAGNOSIS — I10 Essential (primary) hypertension: Secondary | ICD-10-CM | POA: Diagnosis not present

## 2014-03-25 DIAGNOSIS — F319 Bipolar disorder, unspecified: Secondary | ICD-10-CM | POA: Diagnosis not present

## 2014-03-25 DIAGNOSIS — Z9889 Other specified postprocedural states: Secondary | ICD-10-CM | POA: Diagnosis not present

## 2014-03-25 DIAGNOSIS — Z8619 Personal history of other infectious and parasitic diseases: Secondary | ICD-10-CM | POA: Diagnosis not present

## 2014-03-25 DIAGNOSIS — W19XXXA Unspecified fall, initial encounter: Secondary | ICD-10-CM

## 2014-03-25 DIAGNOSIS — E119 Type 2 diabetes mellitus without complications: Secondary | ICD-10-CM | POA: Insufficient documentation

## 2014-03-25 DIAGNOSIS — Y9389 Activity, other specified: Secondary | ICD-10-CM | POA: Diagnosis not present

## 2014-03-25 DIAGNOSIS — T148XXA Other injury of unspecified body region, initial encounter: Secondary | ICD-10-CM

## 2014-03-25 DIAGNOSIS — S99929A Unspecified injury of unspecified foot, initial encounter: Secondary | ICD-10-CM

## 2014-03-25 LAB — CBC WITH DIFFERENTIAL/PLATELET
Basophils Absolute: 0 10*3/uL (ref 0.0–0.1)
Basophils Relative: 0 % (ref 0–1)
EOS PCT: 1 % (ref 0–5)
Eosinophils Absolute: 0.1 10*3/uL (ref 0.0–0.7)
HEMATOCRIT: 36.4 % — AB (ref 39.0–52.0)
HEMOGLOBIN: 12.2 g/dL — AB (ref 13.0–17.0)
LYMPHS ABS: 1.4 10*3/uL (ref 0.7–4.0)
Lymphocytes Relative: 15 % (ref 12–46)
MCH: 29.4 pg (ref 26.0–34.0)
MCHC: 33.5 g/dL (ref 30.0–36.0)
MCV: 87.7 fL (ref 78.0–100.0)
MONO ABS: 0.7 10*3/uL (ref 0.1–1.0)
Monocytes Relative: 7 % (ref 3–12)
Neutro Abs: 7.6 10*3/uL (ref 1.7–7.7)
Neutrophils Relative %: 77 % (ref 43–77)
Platelets: 194 10*3/uL (ref 150–400)
RBC: 4.15 MIL/uL — ABNORMAL LOW (ref 4.22–5.81)
RDW: 12.3 % (ref 11.5–15.5)
WBC: 9.9 10*3/uL (ref 4.0–10.5)

## 2014-03-25 LAB — COMPREHENSIVE METABOLIC PANEL
ALK PHOS: 76 U/L (ref 39–117)
ALT: 14 U/L (ref 0–53)
AST: 20 U/L (ref 0–37)
Albumin: 3.7 g/dL (ref 3.5–5.2)
Anion gap: 16 — ABNORMAL HIGH (ref 5–15)
BILIRUBIN TOTAL: 0.5 mg/dL (ref 0.3–1.2)
BUN: 13 mg/dL (ref 6–23)
CHLORIDE: 96 meq/L (ref 96–112)
CO2: 25 meq/L (ref 19–32)
Calcium: 9.1 mg/dL (ref 8.4–10.5)
Creatinine, Ser: 0.99 mg/dL (ref 0.50–1.35)
GFR calc Af Amer: >90 mL/min (ref >90)
GFR calc non Af Amer: >90 mL/min (ref >90)
Glucose, Bld: 334 mg/dL — ABNORMAL HIGH (ref 70–99)
POTASSIUM: 3.3 meq/L — AB (ref 3.7–5.3)
Sodium: 137 mEq/L (ref 137–147)
Total Protein: 7.6 g/dL (ref 6.0–8.3)

## 2014-03-25 LAB — DIGOXIN LEVEL: Digoxin Level: <0.3 ng/mL — ABNORMAL LOW (ref 0.8–2.0)

## 2014-03-25 LAB — TROPONIN I: Troponin I: <0.3 ng/mL (ref <0.30)

## 2014-03-25 NOTE — ED Provider Notes (Signed)
CSN: [id number]     Arrival date & time 03/25/14  2016 History   First MD Initiated Contact with Patient 03/25/14 2025     Chief Complaint  Patient presents with  . Loss of Consciousness     (Consider location/radiation/quality/duration/timing/severity/associated sxs/prior Treatment) Patient is a 43 y.o. male presenting with syncope.  Loss of Consciousness Episode history:  Single Most recent episode:  Today Timing: once. Progression:  Resolved Chronicity:  Recurrent Context comment:  While sitting in a wheelchair getting ready for a sleep study Witnessed: no   Relieved by:  Nothing Worsened by:  Nothing tried Ineffective treatments:  None tried Associated symptoms: no chest pain, no fever, no headaches, no nausea, no shortness of breath and no vomiting     Past Medical History  Diagnosis Date  . Hypertension   . Hyperlipidemia   . CVA (cerebral infarction)     No residual deficits  . Non-ischemic cardiomyopathy     No ischemia on myoview, showed EF of 42%. 2D echo showed EF 50-55% with diatolic dysfunction in 2009  . Post-cardiac injury syndrome     History of cardiac injury from blunt trauma  . PVCs (premature ventricular contractions)   . History of colonic polyps   . Obesity   . Syncope     Recurrent, thought to be vasovagal. Also has h/o frequent PVCs.   . Chronic systolic heart failure 11/2010    a. NICM 4/14 EF 20-25%, TR mild  . SOB (shortness of breath)   . 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 Headache(784.0)   . Diabetes mellitus     TYPE II; UNCONTROLLED BY HEMOGLOBIN A1c; STABLE AS  PER DISCHARGE  . CHF (congestive heart failure)   . Myocardial infarction     at age 10 years old (while playing football)  . Stroke 2005    some left side weakness  . OSA (obstructive sleep apnea)     Being set up again for c-pap  . Pneumonia   . Depression     PTSD,   . Bipolar disorder   . Schizophrenia     Goes to [facility]   Past Surgical  History  Procedure Laterality Date  . Cardiac catheterization  12/19/10    DIFFUSE NONOBSTRUCTIVE CAD; NONISCHEMIC CARDIOMYOPATHY; LEFT VENTRICULAR ANGIOGRAM WAS PERFORMED SECONDARY TO  ELEVATED LEFT VENTRICULAR FILLING PRESSURES  . Orif finger / thumb fracture Right   . Colonoscopy w/ polypectomy    . Multiple extractions with alveoloplasty  01/27/2014    "all my teeth; 4 Quadrants of alveoloplasty  . Multiple extractions with alveoloplasty N/A 01/27/2014    Procedure: EXTRACTION OF TEETH #'1, 2, 3, 4, 5, 6, 7, 8, 9, 10, 11, 12, 13, 14, 15, 16, 17, 20, 21, 22, 23, 24, 25, 26, 27, 28, 29, 31 and 32 WITH ALVEOLOPLASTY;  Surgeon: Charlynne Pander, DDS;  Location: [facility] OR;  Service: Oral Surgery;  Laterality: N/A;   Family History  Problem Relation Age of Onset  . Heart disease Mother     MI  . Heart failure Mother   . Diabetes Mother     ALSO IN MOST OF HIS SIBLINGS; 2 UNLCES HAVE ALSO PASSED AWAY FROM DM  . Cardiomyopathy Mother   . Cancer - Ovarian Mother   . Heart disease Father   . Hypertension Father   . Diabetes Father    History  Substance Use Topics  . Smoking status: Never Smoker   . Smokeless tobacco:  Never Used  . Alcohol Use: No    Review of Systems  Constitutional: Negative for fever.  HENT: Negative for drooling and rhinorrhea.   Eyes: Negative for pain.  Respiratory: Negative for cough and shortness of breath.   Cardiovascular: Positive for syncope. Negative for chest pain and leg swelling.  Gastrointestinal: Negative for nausea, vomiting, abdominal pain and diarrhea.  Genitourinary: Negative for dysuria and hematuria.  Musculoskeletal: Negative for gait problem and neck pain.  Skin: Negative for color change.  Neurological: Negative for numbness and headaches.  Hematological: Negative for adenopathy.  Psychiatric/Behavioral: Negative for behavioral problems.  All other systems reviewed and are negative.     Allergies  Nsaids  Home Medications   Prior to  Admission medications   Medication Sig Start Date End Date Taking? Authorizing Provider  carvedilol (COREG) 25 MG tablet Take 1 tablet (25 mg total) by mouth 2 (two) times daily with a meal. 02/07/14  Yes Lonia Skinner Sofia, PA-C  digoxin (LANOXIN) 0.25 MG tablet Take 0.25 mg by mouth daily.   Yes Historical Provider, MD  furosemide (LASIX) 40 MG tablet Take 1 tablet (40 mg total) by mouth 2 (two) times daily. 11/25/13  Yes Renae Fickle, MD  insulin NPH-regular Human (NOVOLIN 70/30) (70-30) 100 UNIT/ML injection Inject 40 Units into the skin 2 (two) times daily with a meal. 02/04/14  Yes Amy D Clegg, NP  losartan (COZAAR) 100 MG tablet Take 50-100 mg by mouth 2 (two) times daily. Takes 100 mg in the morning and 50 mg in the evening   Yes Historical Provider, MD  magnesium oxide (MAG-OX) 400 MG tablet Take 400 mg by mouth daily.   Yes Historical Provider, MD  QUEtiapine (SEROQUEL) 100 MG tablet Take 1 tablet (100 mg total) by mouth at bedtime. 11/25/13  Yes Renae Fickle, MD  sertraline (ZOLOFT) 100 MG tablet Take 1 tablet (100 mg total) by mouth 2 (two) times daily. 11/25/13  Yes Renae Fickle, MD  spironolactone (ALDACTONE) 50 MG tablet Take 1 tablet (50 mg total) by mouth daily. 11/25/13 11/25/14 Yes Renae Fickle, MD   BP 118/74  Pulse 94  Temp(Src) 97.9 F (36.6 C) (Oral)  Resp 20  SpO2 99% Physical Exam  Nursing note and vitals reviewed. Constitutional: He is oriented to person, place, and time. He appears well-developed and well-nourished.  HENT:  Head: Normocephalic and atraumatic.  Right Ear: External ear normal.  Left Ear: External ear normal.  Nose: Nose normal.  Mouth/Throat: Oropharynx is clear and moist. No oropharyngeal exudate.  Eyes: Conjunctivae and EOM are normal. Pupils are equal, round, and reactive to light.  Neck: Normal range of motion. Neck supple.  Cardiovascular: Normal rate, regular rhythm, normal heart sounds and intact distal pulses.  Exam reveals no gallop and  no friction rub.   No murmur heard. Pulmonary/Chest: Effort normal and breath sounds normal. No respiratory distress. He has no wheezes.  Abdominal: Soft. Bowel sounds are normal. He exhibits no distension. There is no tenderness. There is no rebound and no guarding.  Musculoskeletal: Normal range of motion. He exhibits no edema and no tenderness.  Abrasion to right anterior knee. Normal range of motion of the right knee without any significant pain.  Neurological: He is alert and oriented to person, place, and time.  alert, oriented x3 speech: normal in context and clarity memory: intact grossly cranial nerves II-XII: intact motor strength: full proximally and distally no involuntary movements or tremors sensation: intact to light touch diffusely  cerebellar: finger-to-nose  and heel-to-shin intact gait: normal forwards and backwards   Skin: Skin is warm and dry.  Psychiatric: He has a normal mood and affect. His behavior is normal.    ED Course  Procedures (including critical care time) Labs Review Labs Reviewed  CBC WITH DIFFERENTIAL - Abnormal; Notable for the following:    RBC 4.15 (*)    Hemoglobin 12.2 (*)    HCT 36.4 (*)    All other components within normal limits  COMPREHENSIVE METABOLIC PANEL - Abnormal; Notable for the following:    Potassium 3.3 (*)    Glucose, Bld 334 (*)    Anion gap 16 (*)    All other components within normal limits  DIGOXIN LEVEL - Abnormal; Notable for the following:    Digoxin Level <0.3 (*)    All other components within normal limits  TROPONIN I    Imaging Review Dg Chest 2 View  03/25/2014   CLINICAL DATA:  Syncope.  EXAM: CHEST  2 VIEW  COMPARISON:  PA and lateral chest 01/07/2014.  FINDINGS: There is cardiomegaly without pulmonary edema. Lung volumes are somewhat low but the lungs are clear. No pneumothorax or pleural effusion.  IMPRESSION: Cardiomegaly without acute disease.   Electronically Signed   By: Drusilla Kanner M.D.   On:  03/25/2014 21:35     EKG Interpretation   Date/Time:  Wednesday March 25 2014 20:24:52 EDT Ventricular Rate:  89 PR Interval:  176 QRS Duration: 111 QT Interval:  410 QTC Calculation: 499 R Axis:   -40 Text Interpretation:  Sinus rhythm Left anterior fascicular block Abnormal  R-wave progression, late transition Left ventricular hypertrophy  Borderline prolonged QT interval No significant change since last tracing  Confirmed by Sabreen Kitchen  MD, Marley Pakula ([id number]) on 03/25/2014 9:20:40 PM      MDM   Final diagnoses:  Fall, initial encounter  Drowsiness  Abrasion    9:16 PM 43 y.o. male w a hx of chf on digoxin, recurrent syncope though to be vasovagal, CVA who presents after falling out of a wheelchair prior to arrival. He states that he was supposed to have a sleep study tonight. He was sitting in a wheelchair and fell to the ground. He denies syncope. He states that he had been out of his Seroquel for approximately one week and restarted it. He states that this medication typically makes him very drowsy. He attributes the fall to the Seroquel. He is afebrile and vital signs are unremarkable here. He has no other complaints and a normal neurologic exam. Will get screening labs. Tdap UTD per pt.   11:00 PM: I interpreted/reviewed the labs and/or imaging which were non-contributory.  Pt continues to appear well. Remains mildly drowsy which is likely related to the seroquel as he has had similar sx w/ taking this med in the past.  I have discussed the diagnosis/risks/treatment options with the patient and believe the pt to be eligible for discharge home to follow-up with his pcp as needed. We also discussed returning to the ED immediately if new or worsening sx occur. We discussed the sx which are most concerning (e.g., further syncopal episodes, cp, sob) that necessitate immediate return. Medications administered to the patient during their visit and any new prescriptions provided to the patient  are listed below.  Medications given during this visit Medications - No data to display  New Prescriptions   No medications on file     Purvis Sheffield, MD 03/25/14 2319

## 2014-03-25 NOTE — ED Notes (Signed)
Pt from sleep study. Pt was found by staff, who state the pt appeared to have lost consciousness and fell from a chair to the floor. Pt was conscious when staff found pt. Pt states he remembers falling and vomiting, denies losing consciousness. Pt took Seroquel 30 minutes before arriving to sleep study, states he has not taken Seroquel for a week prior to today. Pt alert and oriented x 4.

## 2014-03-25 NOTE — ED Notes (Signed)
Bed: WA17 Expected date:  Expected time:  Means of arrival:  Comments: Patient from sleep study

## 2014-03-26 ENCOUNTER — Ambulatory Visit (HOSPITAL_COMMUNITY): Payer: Medicaid - Dental | Admitting: Dentistry

## 2014-03-26 ENCOUNTER — Encounter (HOSPITAL_COMMUNITY): Payer: Self-pay | Admitting: Dentistry

## 2014-03-26 VITALS — BP 154/93 | HR 94 | Temp 98.6°F

## 2014-03-26 DIAGNOSIS — K000 Anodontia: Principal | ICD-10-CM

## 2014-03-26 DIAGNOSIS — K08109 Complete loss of teeth, unspecified cause, unspecified class: Secondary | ICD-10-CM

## 2014-03-26 DIAGNOSIS — Z463 Encounter for fitting and adjustment of dental prosthetic device: Secondary | ICD-10-CM

## 2014-03-26 DIAGNOSIS — K0820 Unspecified atrophy of edentulous alveolar ridge: Secondary | ICD-10-CM

## 2014-03-26 NOTE — Sleep Study (Signed)
03/25/2014 (2015) [facility]. Patient was dropped of by a family friend named [name] who had No knowledge of patient's medications nor of patient's history. All the friend knew was that he had taken his sleep medications prior to her bringing him here. Patient was noted to be slow to respond, somewhat lethargic, with slurred speech and. Scars where noted on patient's knees and elbow patient was asked about them but was unable to tell what had happen. Patient's friend [name] ) stated she had to leave because she had to be at work and so she left. Patient was escorted to room #4 by two tech's [name], and [name] )  who assisted him to a chair and made sure he was okay. Once patient was in the room and was being check in for his sleep study, tech's noted patient was becoming more lethargic and unable to answer questions that were being asked of him. Patient started sliding down in chair and before tech's could stop patient, patient fell to the floor and started vomiting. House coverage [name] ) was called and the  situation was explained to her. [name] and [name] came over assessed patient and decide to take patient over to emergency department for further monitoring. [name] and [name] was called told about the situation, also a safety zone portal was filled out. Tech attempted to reach patient's friend who had dropped him off but was unsuccessful so a message was left for [name] to return call once message was received.  [name], RPSGT, RRT.

## 2014-03-26 NOTE — Progress Notes (Signed)
03/26/2014  Patient:            Mitchell [name] Date of Birth:  1971-06-08 MRN:                [id number]  BP 154/93  Pulse 94  Temp(Src) 98.6 F (37 C) (Oral)  Myer Haff Parlin presents for continued denture fabrication. Procedure:  Upper and lower denture Jaw relations with aluwax bite registration. Patient agrees to tooth selection of 21J, I, and 10 degree posteriors to match with Portrait A2 shade. Patient was made aware of the class II relationship and the extent that the maxillary teeth are significantly ahead of the mandibular lower teeth. Patient expresses understanding agrees to proceed with class II set up with dropping lower premolars as needed. Patient tolerated procedure well. RTC for denture wax try in.  Charlynne Pander, DDS

## 2014-03-26 NOTE — Patient Instructions (Signed)
Patient to return to clinic for continued upper and lower complete denture fabrication. Dr. Kristin Bruins

## 2014-04-07 ENCOUNTER — Ambulatory Visit (HOSPITAL_COMMUNITY): Payer: Medicaid - Dental | Admitting: Dentistry

## 2014-04-07 ENCOUNTER — Encounter (HOSPITAL_COMMUNITY): Payer: Self-pay | Admitting: Dentistry

## 2014-04-07 VITALS — BP 142/92 | HR 106 | Temp 99.1°F

## 2014-04-07 DIAGNOSIS — K000 Anodontia: Principal | ICD-10-CM

## 2014-04-07 DIAGNOSIS — K08109 Complete loss of teeth, unspecified cause, unspecified class: Secondary | ICD-10-CM

## 2014-04-07 DIAGNOSIS — Z463 Encounter for fitting and adjustment of dental prosthetic device: Secondary | ICD-10-CM

## 2014-04-07 DIAGNOSIS — K0820 Unspecified atrophy of edentulous alveolar ridge: Secondary | ICD-10-CM

## 2014-04-07 DIAGNOSIS — M264 Malocclusion, unspecified: Secondary | ICD-10-CM

## 2014-04-07 NOTE — Patient Instructions (Signed)
Return to clinic as scheduled for continued upper and lower complete denture fabrication. Dr. Eris Hannan 

## 2014-04-07 NOTE — Progress Notes (Signed)
04/07/2014  Patient:            Mitchell [name] Date of Birth:  July 05, 1971 MRN:                [id number]  BP 142/92  Pulse 106  Temp(Src) 99.1 F (37.3 C) (Oral)   [name] presents for continued upper and lower denture fabrication. Procedure:  Upper and lower denture wax tryin. Patient has smile line showing facial muscle deficit on right side from previous stroke. Patient is aware of the class II set up and the dropping of one of the lower premolars. Patient is aware of the significant overjet. Patient accepts esthetics, phonetics, fit and function. Patient agrees to process "as is" in 50:50 Characterized Lucitone 199. Patient to RTC for  upper and lower denture insertion.  Charlynne Pander, DDS

## 2014-04-10 ENCOUNTER — Ambulatory Visit (HOSPITAL_BASED_OUTPATIENT_CLINIC_OR_DEPARTMENT_OTHER): Payer: No Typology Code available for payment source | Attending: Pulmonary Disease

## 2014-04-10 VITALS — Ht 72.0 in | Wt 281.0 lb

## 2014-04-10 DIAGNOSIS — G4733 Obstructive sleep apnea (adult) (pediatric): Secondary | ICD-10-CM | POA: Insufficient documentation

## 2014-04-15 ENCOUNTER — Encounter (HOSPITAL_COMMUNITY): Payer: Self-pay | Admitting: Dentistry

## 2014-04-17 DIAGNOSIS — G4730 Sleep apnea, unspecified: Secondary | ICD-10-CM

## 2014-04-17 DIAGNOSIS — G4710 Hypersomnia, unspecified: Secondary | ICD-10-CM

## 2014-04-17 NOTE — Sleep Study (Signed)
   NAME: Mitchell [name] DATE OF BIRTH:  18-Apr-1971 MEDICAL RECORD NUMBER [id number]  LOCATION: [facility]  PHYSICIAN: Barbaraann Share  DATE OF STUDY: 04/10/2014  SLEEP STUDY TYPE: Nocturnal Polysomnogram               REFERRING PHYSICIAN: Clance, Maree Krabbe, MD  INDICATION FOR STUDY: Hypersomnia with sleep apnea  EPWORTH SLEEPINESS SCORE:  13 HEIGHT: 6' (182.9 cm)  WEIGHT: 281 lb (127.461 kg)    Body mass index is 38.1 kg/(m^2).  NECK SIZE: 19 in.  MEDICATIONS: Reviewed in the sleep record  SLEEP ARCHITECTURE: The patient had a total sleep time of 343 minutes, with no slow-wave sleep and decreased quantity of REM. Sleep onset latency was prolonged at 55 minutes, and REM onset was normal. Sleep efficiency was mildly reduced at 82%.  RESPIRATORY DATA: The patient was found to have one obstructive apnea and 320 obstructive hypopneas, giving him an AHI of 6 events per hour. The events occurred in all body positions, and there was moderate snoring noted throughout.  OXYGEN DATA: There was oxygen desaturation as low as 82% with the patient's obstructive events  CARDIAC DATA: Rare PVC noted  MOVEMENT/PARASOMNIA: No periodic limb movements or other abnormal behaviors were seen.  IMPRESSION/ RECOMMENDATION:    1) mild obstructive sleep apnea/hypopnea syndrome, with an AHI of 6 events per hour and oxygen desaturation as low as 82%. Treatment for this degree of sleep apnea can include a trial of weight loss alone, upper airway surgery, dental appliance, and also CPAP. Clinical correlation is suggested.  2) rare PVC noted, but no clinically significant arrhythmias were seen.     Barbaraann Share Diplomate, American Board of Sleep Medicine  ELECTRONICALLY SIGNED ON:  04/17/2014, 6:47 PM [facility] PH: [phone]   FX: [phone] ACCREDITED BY THE AMERICAN ACADEMY OF SLEEP MEDICINE

## 2014-04-17 NOTE — Progress Notes (Signed)
Let pt know that his sleep study showed only very mild sleep apnea.  I would like to see him in office soon after I get back so I can discuss more fully with him.

## 2014-04-20 ENCOUNTER — Ambulatory Visit (HOSPITAL_COMMUNITY): Payer: Medicaid - Dental | Admitting: Dentistry

## 2014-04-20 ENCOUNTER — Encounter (HOSPITAL_COMMUNITY): Payer: Self-pay | Admitting: Dentistry

## 2014-04-20 VITALS — BP 158/86 | HR 93 | Temp 97.6°F

## 2014-04-20 DIAGNOSIS — K0820 Unspecified atrophy of edentulous alveolar ridge: Secondary | ICD-10-CM

## 2014-04-20 DIAGNOSIS — M264 Malocclusion, unspecified: Secondary | ICD-10-CM

## 2014-04-20 DIAGNOSIS — Z463 Encounter for fitting and adjustment of dental prosthetic device: Secondary | ICD-10-CM

## 2014-04-20 DIAGNOSIS — K08109 Complete loss of teeth, unspecified cause, unspecified class: Secondary | ICD-10-CM

## 2014-04-20 DIAGNOSIS — K000 Anodontia: Principal | ICD-10-CM

## 2014-04-20 NOTE — Progress Notes (Signed)
04/20/2014  Patient:            Mitchell [name] Date of Birth:  [birth year] MRN:                [id number]  BP 158/86  Pulse 93  Temp(Src) 97.6 F (36.4 C)   Kolby L Ranft presents for insertion of upper and lower complete dentures.  Procedure: Pressure indicating paste was applied to the dentures. Adjustments were made as needed. [place]. Occlusion evaluated and adjustments made as needed for Centric Relation and protrusive strokes. Good esthetics, phonetics, fit, and function noted. Patient accepts results. Post op instructions provided in written and verbal formats on use and care of dentures. Gave patient denture brush and cup. Patient to keep dentures out if sore spots develop. Use salt water rinses as needed to aid healing. Return to clinic as scheduled for denture adjustment.  Call if problems arise before then. Patient dismissed in stable condition.  Charlynne Pander, DDS

## 2014-04-20 NOTE — Progress Notes (Signed)
D/t schedule pt scheduled appt 05/04/14.

## 2014-04-20 NOTE — Patient Instructions (Signed)
Instructions for Denture Use and Care  Congratulations, you are on the way to oral rehabilitation!  You have just received a new set of complete or partial dentures.  These prostheses will help to improve both your appearance and chewing ability.  These instructions will help you get adjusted to your dentures as well as care for them properly.  Please read these instructions carefully and completely as soon as you get home.  If you or your caregiver have any questions please notify the [facility] at [phone].  HOW YOUR DENTURES LOOK AND FEEL Soon after you begin wearing your dentures, you may feel that your dentures are too large or even loose.  As our mouth and facial muscles become accustomed to the dentures, these feelings will go away.  You also may feel that you are salivating more than you normally do.  This feeling should go away as you get used to having the dentures in your mouth.  You may bite your cheek or your tongue; this will eventually resolve itself as you wear your dentures.  Some soreness is to be expected, but you should not hurt.  If your mouth hurts, call your dentist.  A denture adhesive may occasionally be necessary to hold your dentures in place more securely.  The dentist will let you know when one is recommended for you.  SPEAKING Wearing dentures will change the sound of your voice initially.  This will be noticed by you more than anyone else.  Bite and swallow before you speak, in order to place your dentures in position so that you may speak more clearly.  Practice speaking by reading aloud or counting from 1 to 100 very slowly and distinctly.  After some practice your mouth will become accustomed to your dentures and you will speak more clearly.  EATING Chewing will definitely be different after you receive your dentures.  With a little practice and patience you should be able to eat just about any kind of food.  Begin by eating small quantities of food  that are cut into small pieces.  Star with soft foods such as eggs, cooked vegetables, or puddings.  As you gain confidence advance  Your diet to whatever texture foods you can tolerate.  DENTURE CARE Dentures can collect plaque and calculus much the same as natural teeth can.  If not removed on a regular basis, your dentures will not look or feel clean, and you will experience denture odor.  It is very important that you remove your dentures at bedtime and clean them thoroughly.  You should: 1. Clean your dentures over a sink full of water so if dropped, breakage will be prevented. 2. Rinse your dentures with cool water to remove any large food particles. 3. Use soap and water or a denture cleanser or paste to clean the dentures.  Do not use regular toothpaste as it may abrade the denture base or teeth. 4. Use a moistened denture brush to clean all surfaces (inside and outside). 5. Rinse thoroughly to remove any remaining soap or denture cleanser. 6. Use a soft bristle toothbrush to gently brush any natural teeth, gums, tongue, and palate at bedtime and before reinserting your dentures. 7. Do not sleep with your dentures in your mouth at night.  Remove your dentures and soak them overnight in a denture cup filled with water or denture solution as recommended by your dentist.  This routine will become second nature and will increase the life and comfort   of your dentures.  Please do not try to adjust these dentures yourself; you could damage them.  FOLLOW-UP You should call or make an appointment with your dentist.  Your dentist would like to see you at least once a year for a check-up and examination. 

## 2014-04-28 ENCOUNTER — Encounter (HOSPITAL_COMMUNITY): Payer: Self-pay | Admitting: Dentistry

## 2014-05-02 ENCOUNTER — Emergency Department (HOSPITAL_BASED_OUTPATIENT_CLINIC_OR_DEPARTMENT_OTHER)
Admission: EM | Admit: 2014-05-02 | Discharge: 2014-05-02 | Disposition: A | Discharge status: Home / Self Care | Payer: No Typology Code available for payment source | Attending: Emergency Medicine | Admitting: Emergency Medicine

## 2014-05-02 ENCOUNTER — Encounter (HOSPITAL_BASED_OUTPATIENT_CLINIC_OR_DEPARTMENT_OTHER): Payer: Self-pay | Admitting: Emergency Medicine

## 2014-05-02 DIAGNOSIS — Z8673 Personal history of transient ischemic attack (TIA), and cerebral infarction without residual deficits: Secondary | ICD-10-CM | POA: Insufficient documentation

## 2014-05-02 DIAGNOSIS — E669 Obesity, unspecified: Secondary | ICD-10-CM | POA: Insufficient documentation

## 2014-05-02 DIAGNOSIS — Z794 Long term (current) use of insulin: Secondary | ICD-10-CM | POA: Diagnosis not present

## 2014-05-02 DIAGNOSIS — Z8619 Personal history of other infectious and parasitic diseases: Secondary | ICD-10-CM | POA: Insufficient documentation

## 2014-05-02 DIAGNOSIS — I5022 Chronic systolic (congestive) heart failure: Secondary | ICD-10-CM | POA: Diagnosis not present

## 2014-05-02 DIAGNOSIS — I252 Old myocardial infarction: Secondary | ICD-10-CM | POA: Insufficient documentation

## 2014-05-02 DIAGNOSIS — I10 Essential (primary) hypertension: Secondary | ICD-10-CM | POA: Diagnosis not present

## 2014-05-02 DIAGNOSIS — Z8701 Personal history of pneumonia (recurrent): Secondary | ICD-10-CM | POA: Insufficient documentation

## 2014-05-02 DIAGNOSIS — F209 Schizophrenia, unspecified: Secondary | ICD-10-CM | POA: Diagnosis not present

## 2014-05-02 DIAGNOSIS — E119 Type 2 diabetes mellitus without complications: Secondary | ICD-10-CM | POA: Insufficient documentation

## 2014-05-02 DIAGNOSIS — Z86010 Personal history of colonic polyps: Secondary | ICD-10-CM | POA: Insufficient documentation

## 2014-05-02 DIAGNOSIS — Z8639 Personal history of other endocrine, nutritional and metabolic disease: Secondary | ICD-10-CM | POA: Insufficient documentation

## 2014-05-02 DIAGNOSIS — F319 Bipolar disorder, unspecified: Secondary | ICD-10-CM | POA: Insufficient documentation

## 2014-05-02 DIAGNOSIS — Z79899 Other long term (current) drug therapy: Secondary | ICD-10-CM | POA: Insufficient documentation

## 2014-05-02 DIAGNOSIS — K088 Other specified disorders of teeth and supporting structures: Secondary | ICD-10-CM | POA: Diagnosis present

## 2014-05-02 DIAGNOSIS — K0889 Other specified disorders of teeth and supporting structures: Secondary | ICD-10-CM

## 2014-05-02 DIAGNOSIS — G4733 Obstructive sleep apnea (adult) (pediatric): Secondary | ICD-10-CM | POA: Diagnosis not present

## 2014-05-02 MED ORDER — OXYCODONE-ACETAMINOPHEN 5-325 MG PO TABS
1.0000 | ORAL_TABLET | Freq: Once | ORAL | Status: AC
  Administered 2014-05-02: 1 via ORAL
  Filled 2014-05-02: qty 1

## 2014-05-02 MED ORDER — OXYCODONE-ACETAMINOPHEN 5-325 MG PO TABS: 1.0000 | ORAL_TABLET | ORAL | Status: DC | PRN

## 2014-05-02 NOTE — Discharge Instructions (Signed)
FOLLOW UP WITH DR. Kristin Bruins FOR FURTHER MANAGEMENT OF RECURRENT MOUTH PAIN AND DENTAL COMPLICATION.

## 2014-05-02 NOTE — ED Provider Notes (Signed)
Medical screening examination/treatment/procedure(s) were performed by non-physician practitioner and as supervising physician I was immediately available for consultation/collaboration.   EKG Interpretation None        Elwin Mocha, MD 05/02/14 2110

## 2014-05-02 NOTE — ED Provider Notes (Signed)
CSN: [id number]     Arrival date & time 05/02/14  1818 History   First MD Initiated Contact with Patient 05/02/14 1845     Chief Complaint  Patient presents with  . Dental Pain     (Consider location/radiation/quality/duration/timing/severity/associated sxs/prior Treatment) Patient is a 43 y.o. male presenting with tooth pain. The history is provided by the patient. No language interpreter was used.  Dental Pain Location:  Lower Severity:  Severe Associated symptoms: no facial swelling and no fever   Associated symptoms comment:  The patient presents with pain in the area of the 32nd molar, however, is s/p total dental extractions and is edentulous. He reports that there is an area with persistent bone growth and he has had to have multiple bone shaving in this area. Symptoms recurred in the last several days. No facial swelling or fever.    Past Medical History  Diagnosis Date  . Hypertension   . Hyperlipidemia   . CVA (cerebral infarction)     No residual deficits  . Non-ischemic cardiomyopathy     No ischemia on myoview, showed EF of 42%. 2D echo showed EF 50-55% with diatolic dysfunction in 2009  . Post-cardiac injury syndrome     History of cardiac injury from blunt trauma  . PVCs (premature ventricular contractions)   . History of colonic polyps   . Obesity   . Syncope     Recurrent, thought to be vasovagal. Also has h/o frequent PVCs.   . Chronic systolic heart failure 11/2010    a. NICM 4/14 EF 20-25%, TR mild  . SOB (shortness of breath)   . 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 Headache(784.0)   . Diabetes mellitus     TYPE II; UNCONTROLLED BY HEMOGLOBIN A1c; STABLE AS  PER DISCHARGE  . CHF (congestive heart failure)   . Myocardial infarction     at age 30 years old (while playing football)  . Stroke 2005    some left side weakness  . OSA (obstructive sleep apnea)     Being set up again for c-pap  . Pneumonia   . Depression     PTSD,   . Bipolar disorder   .  Schizophrenia     Goes to [facility]   Past Surgical History  Procedure Laterality Date  . Cardiac catheterization  12/19/10    DIFFUSE NONOBSTRUCTIVE CAD; NONISCHEMIC CARDIOMYOPATHY; LEFT VENTRICULAR ANGIOGRAM WAS PERFORMED SECONDARY TO  ELEVATED LEFT VENTRICULAR FILLING PRESSURES  . Orif finger / thumb fracture Right   . Colonoscopy w/ polypectomy    . Multiple extractions with alveoloplasty  01/27/2014    "all my teeth; 4 Quadrants of alveoloplasty  . Multiple extractions with alveoloplasty N/A 01/27/2014    Procedure: EXTRACTION OF TEETH #'1, 2, 3, 4, 5, 6, 7, 8, 9, 10, 11, 12, 13, 14, 15, 16, 17, 20, 21, 22, 23, 24, 25, 26, 27, 28, 29, 31 and 32 WITH ALVEOLOPLASTY;  Surgeon: Charlynne Pander, DDS;  Location: [facility] OR;  Service: Oral Surgery;  Laterality: N/A;   Family History  Problem Relation Age of Onset  . Heart disease Mother     MI  . Heart failure Mother   . Diabetes Mother     ALSO IN MOST OF HIS SIBLINGS; 2 UNLCES HAVE ALSO PASSED AWAY FROM DM  . Cardiomyopathy Mother   . Cancer - Ovarian Mother   . Heart disease Father   . Hypertension Father   . Diabetes Father  History  Substance Use Topics  . Smoking status: Never Smoker   . Smokeless tobacco: Never Used  . Alcohol Use: No    Review of Systems  Constitutional: Negative for fever.  HENT: Positive for dental problem. Negative for facial swelling and trouble swallowing.   Respiratory: Negative for shortness of breath.   Cardiovascular: Negative for chest pain.  Gastrointestinal: Negative for nausea.  Musculoskeletal: Negative for myalgias.      Allergies  Nsaids  Home Medications   Prior to Admission medications   Medication Sig Start Date End Date Taking? Authorizing Provider  carvedilol (COREG) 25 MG tablet Take 1 tablet (25 mg total) by mouth 2 (two) times daily with a meal. 02/07/14  Yes Lonia SkinnerLeslie K Sofia, PA-C  digoxin (LANOXIN) 0.25 MG tablet Take 0.25 mg by mouth daily.   Yes  Historical Provider, MD  furosemide (LASIX) 40 MG tablet Take 1 tablet (40 mg total) by mouth 2 (two) times daily. 11/25/13  Yes Renae FickleMackenzie Short, MD  insulin NPH-regular Human (NOVOLIN 70/30) (70-30) 100 UNIT/ML injection Inject 40 Units into the skin 2 (two) times daily with a meal. 02/04/14  Yes Amy D Clegg, NP  losartan (COZAAR) 100 MG tablet Take 50-100 mg by mouth 2 (two) times daily. Takes 100 mg in the morning and 50 mg in the evening   Yes Historical Provider, MD  magnesium oxide (MAG-OX) 400 MG tablet Take 400 mg by mouth daily.   Yes Historical Provider, MD  QUEtiapine (SEROQUEL) 100 MG tablet Take 1 tablet (100 mg total) by mouth at bedtime. 11/25/13  Yes Renae FickleMackenzie Short, MD  sertraline (ZOLOFT) 100 MG tablet Take 1 tablet (100 mg total) by mouth 2 (two) times daily. 11/25/13  Yes Renae FickleMackenzie Short, MD  spironolactone (ALDACTONE) 50 MG tablet Take 1 tablet (50 mg total) by mouth daily. 11/25/13 11/25/14 Yes Renae FickleMackenzie Short, MD   BP 179/97  Pulse 95  Temp(Src) 97.7 F (36.5 C) (Oral)  Resp 21  Ht 6' (1.829 m)  Wt 280 lb (127.007 kg)  BMI 37.97 kg/m2  SpO2 99% Physical Exam  Constitutional: He is oriented to person, place, and time. He appears well-developed and well-nourished. No distress.  HENT:  Head: Normocephalic.  Mouth/Throat: Oropharynx is clear and moist.  Edentulous. There is a bony prominence on medial aspect of space of #32 that is significantly tender. No fluctuance, purulence or soft swelling.   Pulmonary/Chest: Effort normal.  Musculoskeletal: Normal range of motion.  Lymphadenopathy:    He has no cervical adenopathy.  Neurological: He is alert and oriented to person, place, and time.  Psychiatric: He has a normal mood and affect.    ED Course  Procedures (including critical care time) Labs Review Labs Reviewed - No data to display  Imaging Review No results found.   EKG Interpretation None      MDM   Final diagnoses:  None    1. Dental  pain  Discussed follow up with his dentist. Will provide pain relief. Do not feel an antibiotic is necessary with no sign of infection.     Arnoldo HookerShari A Keaundra Stehle, PA-C 05/02/14 1926

## 2014-05-02 NOTE — ED Notes (Signed)
Pt reports pain to right lower gum area - pt does not have teeth and reports frequent "bone shavings" to make dentures fit.

## 2014-05-02 NOTE — ED Notes (Signed)
Pt ambulating independently w/ steady gait on d/c in no acute distress, A&Ox4. D/c instructions reviewed w/ pt - pt denies any further questions or concerns at present. Rx given x1 Pt's wife to drive pt home.

## 2014-05-03 ENCOUNTER — Encounter (HOSPITAL_BASED_OUTPATIENT_CLINIC_OR_DEPARTMENT_OTHER): Payer: Self-pay

## 2014-05-04 ENCOUNTER — Ambulatory Visit: Payer: Self-pay | Admitting: Pulmonary Disease

## 2014-05-06 ENCOUNTER — Ambulatory Visit (HOSPITAL_COMMUNITY): Payer: Medicaid - Dental | Admitting: Dentistry

## 2014-05-06 ENCOUNTER — Encounter (HOSPITAL_COMMUNITY): Payer: Self-pay | Admitting: Dentistry

## 2014-05-06 VITALS — BP 166/104 | HR 101 | Temp 98.6°F

## 2014-05-06 DIAGNOSIS — K08109 Complete loss of teeth, unspecified cause, unspecified class: Secondary | ICD-10-CM

## 2014-05-06 DIAGNOSIS — Z463 Encounter for fitting and adjustment of dental prosthetic device: Secondary | ICD-10-CM

## 2014-05-06 DIAGNOSIS — K062 Gingival and edentulous alveolar ridge lesions associated with trauma: Secondary | ICD-10-CM

## 2014-05-06 DIAGNOSIS — K0820 Unspecified atrophy of edentulous alveolar ridge: Secondary | ICD-10-CM

## 2014-05-06 DIAGNOSIS — K000 Anodontia: Principal | ICD-10-CM

## 2014-05-06 DIAGNOSIS — M264 Malocclusion, unspecified: Secondary | ICD-10-CM

## 2014-05-06 NOTE — Progress Notes (Signed)
05/06/2014  Patient:            Mitchell [name] Date of Birth:  July 30, 1971 MRN:                [id number]  BP 166/104  Pulse 101  Temp(Src) 98.6 F (37 C) (Oral) The patient indicates that he forgot to take his blood pressure medication today. Patient denies having any adverse symptoms associated with the elevated blood pressure. Patient indicates that he" feels fine".   [name] presents for evaluation or recently inserted upper and lower dentures. SUBJECTIVE: Patient is complaining of some denture irritation to the lower right lingual alveolar ridge. Patient denies having any other denture irritation. OBJECTIVE: There is some slight erythema to the lower right lingual alveolar ridge area that is posterior to #32 area. This area has prominent bone but no obvious exposed bone. Procedure: Pressure indicating paste was applied to the dentures. Adjustments were made as needed. [place]. Borders of the denture were adjusted after application of additional pressure indicating paste. This was done to avoid contact to the affected area on the lower right lingual aspect. Occlusion evaluated and adjustments made as needed for Centric Relation and protrusive strokes. Patient accepts results. Patient to keep dentures out if needed to aid healing. Use salt water rinses as needed to aid healing. Return to clinic as scheduled for denture adjustment.  Call if problems arise before then.   Charlynne Pander, DDS

## 2014-05-06 NOTE — Patient Instructions (Signed)
Patient to keep dentures out if needed to aid healing. Use salt water rinses as needed to aid healing. Return to clinic as scheduled for denture adjustment.   Call if problems arise before then.   Ronald F. Kulinski, DDS  

## 2014-05-07 ENCOUNTER — Inpatient Hospital Stay (HOSPITAL_COMMUNITY): Admission: RE | Admit: 2014-05-07 | Payer: Self-pay | Source: Ambulatory Visit

## 2014-05-20 ENCOUNTER — Encounter (HOSPITAL_COMMUNITY): Payer: Self-pay | Admitting: Dentistry

## 2014-05-20 ENCOUNTER — Encounter (INDEPENDENT_AMBULATORY_CARE_PROVIDER_SITE_OTHER): Payer: Self-pay

## 2014-05-20 ENCOUNTER — Ambulatory Visit (HOSPITAL_COMMUNITY): Payer: Medicaid - Dental | Admitting: Dentistry

## 2014-05-20 VITALS — BP 145/88 | HR 93 | Temp 97.8°F

## 2014-05-20 DIAGNOSIS — K0820 Unspecified atrophy of edentulous alveolar ridge: Secondary | ICD-10-CM

## 2014-05-20 DIAGNOSIS — K08109 Complete loss of teeth, unspecified cause, unspecified class: Secondary | ICD-10-CM

## 2014-05-20 DIAGNOSIS — Z463 Encounter for fitting and adjustment of dental prosthetic device: Secondary | ICD-10-CM

## 2014-05-20 DIAGNOSIS — M264 Malocclusion, unspecified: Secondary | ICD-10-CM

## 2014-05-20 DIAGNOSIS — K000 Anodontia: Principal | ICD-10-CM

## 2014-05-20 NOTE — Progress Notes (Signed)
05/20/2014  Patient:            Mitchell [name] Date of Birth:  [birth year] MRN:                [id number]  BP 145/88  Pulse 93  Temp(Src) 97.8 F (36.6 C) (Oral)  Mitchell [name] presents for evaluation or recently inserted upper and lower dentures. SUBJECTIVE: Patient is NOT complaining of any denture irritation. Previous irritation to the lower right lingual alveolar ridge has resolved. OBJECTIVE: There is no evidence of denture irritation or erythema. Previous area of irritation on the lower right lingual #32 area has resolved. Procedure: Pressure indicating paste was applied to the dentures. Adjustments were made as needed. [place]. Borders of the denture involving the lower left and lower right retromolar pad lingual areas were adjusted after application of additional pressure indicating paste. Occlusion evaluated and adjustments made as needed for Centric Relation and protrusive strokes. Patient accepts results. Patient to keep dentures out if needed to aid healing. Use salt water rinses as needed to aid healing. Return to clinic as scheduled for denture adjustment.   Call if problems arise before then.  Charlynne Pander, DDS

## 2014-05-20 NOTE — Patient Instructions (Signed)
Patient to keep dentures out if needed to aid healing. Use salt water rinses as needed to aid healing. Return to clinic as scheduled for denture adjustment.   Call if problems arise before then.   Charlynne Pander, DDS

## 2014-05-22 ENCOUNTER — Encounter: Payer: Self-pay | Admitting: Pulmonary Disease

## 2014-05-22 ENCOUNTER — Ambulatory Visit (INDEPENDENT_AMBULATORY_CARE_PROVIDER_SITE_OTHER): Payer: No Typology Code available for payment source | Admitting: Pulmonary Disease

## 2014-05-22 VITALS — BP 140/80 | HR 92 | Temp 99.3°F | Ht 72.0 in | Wt 292.6 lb

## 2014-05-22 DIAGNOSIS — G4733 Obstructive sleep apnea (adult) (pediatric): Secondary | ICD-10-CM

## 2014-05-22 NOTE — Progress Notes (Signed)
   Subjective:    Patient ID: Mitchell [name], male    DOB: Oct 15, 1970, 43 y.o.   MRN: [id number]  HPI The patient comes in today for followup of his recent sleep study. He was found to have mild OSA, with an AHI of 6 events per hour. I have reviewed the study with him in detail, and answered all of his questions.   Review of Systems  Constitutional: Negative for fever and unexpected weight change.  HENT: Negative for congestion, dental problem, ear pain, nosebleeds, postnasal drip, rhinorrhea, sinus pressure, sneezing, sore throat and trouble swallowing.   Eyes: Negative for redness and itching.  Respiratory: Negative for cough, chest tightness, shortness of breath and wheezing.   Cardiovascular: Negative for palpitations and leg swelling.  Gastrointestinal: Negative for nausea and vomiting.  Genitourinary: Negative for dysuria.  Musculoskeletal: Negative for joint swelling.  Skin: Negative for rash.  Neurological: Negative for headaches.  Hematological: Does not bruise/bleed easily.  Psychiatric/Behavioral: Negative for dysphoric mood. The patient is not nervous/anxious.        Objective:   Physical Exam Obese male in no acute distress Nose without purulence or discharge noted Neck without lymphadenopathy or thyromegaly Lower extremities with edema noted, no cyanosis Alert and oriented, moves all 4 extremities.       Assessment & Plan:

## 2014-05-22 NOTE — Assessment & Plan Note (Signed)
The patient has mild obstructive sleep apnea by his recent sleep study, but he is remaining very symptomatic. He has very restless sleep, disturbs his wife's sleep, and is not rested in the mornings. I have reviewed with him a conservative approach for treatment consisting of aggressive weight loss, as well as CPAP while working on weight loss. The patient is edentulous, and therefore not a good candidate for a dental appliance. After a long discussion, he has decided to go back on CPAP, and I think this is very appropriate. I will set the patient up on cpap at a moderate pressure level to allow for desensitization, and will troubleshoot the device over the next 4-6weeks if needed.  The pt is to call me if having issues with tolerance.  Will then optimize the pressure once patient is able to wear cpap on a consistent basis.

## 2014-05-22 NOTE — Patient Instructions (Signed)
Will start on cpap at a moderate pressure level.  Please call if having issues with tolerance Work on weight loss followup with me again in 8 weeks.  

## 2014-06-08 ENCOUNTER — Encounter (HOSPITAL_COMMUNITY): Payer: Self-pay

## 2014-06-08 ENCOUNTER — Ambulatory Visit (HOSPITAL_COMMUNITY)
Admission: RE | Admit: 2014-06-08 | Discharge: 2014-06-08 | Disposition: A | Discharge status: Home / Self Care | Payer: No Typology Code available for payment source | Source: Ambulatory Visit | Attending: Cardiology | Admitting: Cardiology

## 2014-06-08 VITALS — BP 157/111 | HR 101 | Resp 18 | Wt 284.1 lb

## 2014-06-08 DIAGNOSIS — F319 Bipolar disorder, unspecified: Secondary | ICD-10-CM | POA: Diagnosis not present

## 2014-06-08 DIAGNOSIS — G4733 Obstructive sleep apnea (adult) (pediatric): Secondary | ICD-10-CM | POA: Insufficient documentation

## 2014-06-08 DIAGNOSIS — N528 Other male erectile dysfunction: Secondary | ICD-10-CM

## 2014-06-08 DIAGNOSIS — N529 Male erectile dysfunction, unspecified: Secondary | ICD-10-CM | POA: Insufficient documentation

## 2014-06-08 DIAGNOSIS — I252 Old myocardial infarction: Secondary | ICD-10-CM | POA: Insufficient documentation

## 2014-06-08 DIAGNOSIS — E1110 Type 2 diabetes mellitus with ketoacidosis without coma: Secondary | ICD-10-CM

## 2014-06-08 DIAGNOSIS — I340 Nonrheumatic mitral (valve) insufficiency: Secondary | ICD-10-CM | POA: Insufficient documentation

## 2014-06-08 DIAGNOSIS — Z794 Long term (current) use of insulin: Secondary | ICD-10-CM | POA: Diagnosis not present

## 2014-06-08 DIAGNOSIS — Z9114 Patient's other noncompliance with medication regimen: Secondary | ICD-10-CM | POA: Diagnosis not present

## 2014-06-08 DIAGNOSIS — I69354 Hemiplegia and hemiparesis following cerebral infarction affecting left non-dominant side: Secondary | ICD-10-CM | POA: Insufficient documentation

## 2014-06-08 DIAGNOSIS — I10 Essential (primary) hypertension: Secondary | ICD-10-CM | POA: Insufficient documentation

## 2014-06-08 DIAGNOSIS — E1310 Other specified diabetes mellitus with ketoacidosis without coma: Secondary | ICD-10-CM

## 2014-06-08 DIAGNOSIS — F4310 Post-traumatic stress disorder, unspecified: Secondary | ICD-10-CM | POA: Insufficient documentation

## 2014-06-08 DIAGNOSIS — F209 Schizophrenia, unspecified: Secondary | ICD-10-CM | POA: Diagnosis not present

## 2014-06-08 DIAGNOSIS — I429 Cardiomyopathy, unspecified: Secondary | ICD-10-CM | POA: Diagnosis not present

## 2014-06-08 DIAGNOSIS — E1165 Type 2 diabetes mellitus with hyperglycemia: Secondary | ICD-10-CM | POA: Insufficient documentation

## 2014-06-08 DIAGNOSIS — F329 Major depressive disorder, single episode, unspecified: Secondary | ICD-10-CM | POA: Diagnosis not present

## 2014-06-08 DIAGNOSIS — I5022 Chronic systolic (congestive) heart failure: Secondary | ICD-10-CM | POA: Diagnosis not present

## 2014-06-08 DIAGNOSIS — E785 Hyperlipidemia, unspecified: Secondary | ICD-10-CM | POA: Diagnosis not present

## 2014-06-08 DIAGNOSIS — I5020 Unspecified systolic (congestive) heart failure: Secondary | ICD-10-CM | POA: Diagnosis present

## 2014-06-08 LAB — BASIC METABOLIC PANEL
Anion gap: 14 (ref 5–15)
BUN: 12 mg/dL (ref 6–23)
CHLORIDE: 99 meq/L (ref 96–112)
CO2: 25 meq/L (ref 19–32)
Calcium: 8.8 mg/dL (ref 8.4–10.5)
Creatinine, Ser: 0.74 mg/dL (ref 0.50–1.35)
GFR calc Af Amer: >90 mL/min (ref >90)
GFR calc non Af Amer: >90 mL/min (ref >90)
GLUCOSE: 391 mg/dL — AB (ref 70–99)
POTASSIUM: 4.1 meq/L (ref 3.7–5.3)
Sodium: 138 mEq/L (ref 137–147)

## 2014-06-08 MED ORDER — AMLODIPINE BESYLATE 5 MG PO TABS: 5.0000 mg | ORAL_TABLET | Freq: Every day | ORAL | Status: DC

## 2014-06-08 NOTE — Patient Instructions (Signed)
Start taking Amlodipine/Norvasc 5mg  once daily. Prescription has been sent to your pharmacy.  We will refer you to Urology for erectile dysfunction.  Follow up with our office in 4 weeks. *Bring all of your medications to next appointment.  Do the following things EVERYDAY: 1) Weigh yourself in the morning before breakfast. Write it down and keep it in a log. 2) Take your medicines as prescribed 3) Eat low salt foods-Limit salt (sodium) to 2000 mg per day.  4) Stay as active as you can everyday 5) Limit all fluids for the day to less than 2 liters

## 2014-06-08 NOTE — Progress Notes (Signed)
Patient ID: Mitchell [name], male   DOB: 1971/07/26, 43 y.o.   MRN: [id number]  PCP:Dr Caffie Damme [facility]: Vesta Mixer  Pulmonologist: dr Shelle Iron  HPI:  Mitchell [name] is a 43 year old male with multiple medical problems including obesity, uncontrolled hypertension, uncontrolled diabetes, obstructive sleep apnea, bipolar disease/schizophrenia, CVA, nonobstructive coronary disease and systolic congestive heart failure secondary to nonischemic cardiomyopathy.He is S/P multiple tooth extractions 01/27/14.   Admitted to [facility] 6/10 through 01/09/14 with uncontrolled diabetes. Started back on insulin. He was also referred to Dr Kristin Bruins for teeth extractions. Bidil stopped due to suspected syncope. Discharge weight 276 pounds.   He returns for follow up. Overall feeling good.  Denies SOB/PND/Orthopnea. Taking all medications but not weighing.   Using CPAP. Complaining of erectile dysfunciton.   09/23/11: 2D echo checked with reduction in LVEF to 15-20% with mild to moderate mitral valve regurgitation ECHO 10/30/12 EF 20-25%  ECHO 11/22/13 EF 20-25%   Labs 01/09/14 Pro BNP 235  Labs 01/09/14 K 3.6 Creatinine 0.73  Labvs 01/28/14 K 4.2 Creatinine 0.89 Labs 03/25/14 K 3.3 Creatinine 0.99  SH: Lives at home with wife and children. Does not smoke or drink alcohol. FH: Mom had heart diease  ROS: As per HPI otherwise negative  Past Medical History  Diagnosis Date  . Hypertension   . Hyperlipidemia   . CVA (cerebral infarction)     No residual deficits  . Non-ischemic cardiomyopathy     No ischemia on myoview, showed EF of 42%. 2D echo showed EF 50-55% with diatolic dysfunction in 2009  . Post-cardiac injury syndrome     History of cardiac injury from blunt trauma  . PVCs (premature ventricular contractions)   . History of colonic polyps   . Obesity   . Syncope     Recurrent, thought to be vasovagal. Also has h/o frequent PVCs.   . Chronic systolic heart failure 11/2010    a. NICM  4/14 EF 20-25%, TR mild  . SOB (shortness of breath)   . 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 Headache(784.0)   . Diabetes mellitus     TYPE II; UNCONTROLLED BY HEMOGLOBIN A1c; STABLE AS  PER DISCHARGE  . CHF (congestive heart failure)   . Myocardial infarction     at age 23 years old (while playing football)  . Stroke 2005    some left side weakness  . OSA (obstructive sleep apnea)     Being set up again for c-pap  . Pneumonia   . Depression     PTSD,   . Bipolar disorder   . Schizophrenia     Goes to [facility]    Current Outpatient Prescriptions  Medication Sig Dispense Refill  . carvedilol (COREG) 25 MG tablet Take 1 tablet (25 mg total) by mouth 2 (two) times daily with a meal. 60 tablet 1  . digoxin (LANOXIN) 0.25 MG tablet Take 0.25 mg by mouth daily.    . furosemide (LASIX) 40 MG tablet Take 1 tablet (40 mg total) by mouth 2 (two) times daily. 60 tablet 0  . insulin NPH-regular Human (NOVOLIN 70/30) (70-30) 100 UNIT/ML injection Inject 40 Units into the skin 2 (two) times daily with a meal. 10 mL 6  . losartan (COZAAR) 100 MG tablet Take 50-100 mg by mouth 2 (two) times daily. Takes 100 mg in the morning and 50 mg in the evening    . magnesium oxide (MAG-OX) 400 MG tablet Take 400 mg  by mouth daily.    . QUEtiapine (SEROQUEL) 100 MG tablet Take 1 tablet (100 mg total) by mouth at bedtime. 30 tablet 0  . sertraline (ZOLOFT) 100 MG tablet Take 1 tablet (100 mg total) by mouth 2 (two) times daily. 30 tablet 0  . spironolactone (ALDACTONE) 50 MG tablet Take 1 tablet (50 mg total) by mouth daily. 30 tablet 0   No current facility-administered medications for this encounter.    Allergies  Allergen Reactions  . Nsaids Anaphylaxis    PHYSICAL EXAM: Filed Vitals:   06/08/14 0833  BP: 157/111  Pulse: 101  Resp: 18  Weight: 284 lb 2 oz (128.878 kg)  SpO2: 98%    General:  Well appearing. No respiratory difficulty HEENT: normal. No teeth.  Neck:  supple. JVP flat Carotids 2+ bilat; no bruits. No lymphadenopathy or thryomegaly appreciated. Facial edema improved. Neck ecchymotic.  Cor: PMI nondisplaced. Tachycardic & regular rhythm. No rubs, or murmurs. + S3 Lungs: clear Abdomen: Obese, soft, nontender, distended. No hepatosplenomegaly. No bruits or masses. Good bowel sounds. Extremities: no cyanosis, clubbing, rash, edema Neuro: alert & oriented x 3, cranial nerves grossly intact. moves all 4 extremities w/o difficulty. Affect pleasant.    ASSESSMENT & PLAN: 1) Chronic systolic HF, NICM ECHO 10/2013 EF 20-25% . He continues to decline ICD. He understands he is at risk for sudden cardiac death. He will think about.  - NYHA II. Volume status stable.  Continue lasix 40 mg twice a day and spironolactone 50 mg daily. Renal function ok from 01/28/14 . - On goal carvedilol to 25 mg twice a day. Continue digoxin 0.25 mg daily  - On goal losartan 100 mg in am and 50 mg in pm   - In the past he was on Bidil and this was stopped due to suspected syncope. I have considered Bidil  however he wants to consider medication for erectile dysfunction and requesting Urology referral therefore I am not going to add.  -Reinforced the need and importance of daily weights, a low sodium diet, and fluid restriction (less than 2 L a day). Instructed to call the HF clinic if weight increases more than 3 lbs overnight or 5 lbs in a week.  2) HTN- Not at goal but he did not have am medications. Continue current regimen . Add 5 mg amlodipine  3) DM - per PCP . Start on insulin.  4) Noncompliance- Stressed the importance of medication compliance  5) OAS- on CPAP per pulmonary.  6) Erectile dysfunction- Requesting Urology referral.   Follow up in  4 weeks  Laurella Tull NP-C 8:46 AM

## 2014-07-06 ENCOUNTER — Inpatient Hospital Stay (HOSPITAL_COMMUNITY): Admission: RE | Admit: 2014-07-06 | Payer: Self-pay | Source: Ambulatory Visit

## 2014-07-16 ENCOUNTER — Ambulatory Visit: Payer: Self-pay | Admitting: Pulmonary Disease

## 2014-08-18 ENCOUNTER — Encounter (HOSPITAL_COMMUNITY): Payer: Self-pay | Admitting: Dentistry

## 2014-08-27 ENCOUNTER — Encounter (HOSPITAL_BASED_OUTPATIENT_CLINIC_OR_DEPARTMENT_OTHER): Payer: Self-pay | Admitting: *Deleted

## 2014-08-27 ENCOUNTER — Emergency Department (HOSPITAL_BASED_OUTPATIENT_CLINIC_OR_DEPARTMENT_OTHER)
Admission: EM | Admit: 2014-08-27 | Discharge: 2014-08-27 | Disposition: A | Discharge status: Home / Self Care | Payer: No Typology Code available for payment source | Attending: Emergency Medicine | Admitting: Emergency Medicine

## 2014-08-27 ENCOUNTER — Emergency Department (HOSPITAL_BASED_OUTPATIENT_CLINIC_OR_DEPARTMENT_OTHER): Payer: No Typology Code available for payment source

## 2014-08-27 DIAGNOSIS — Z794 Long term (current) use of insulin: Secondary | ICD-10-CM | POA: Diagnosis not present

## 2014-08-27 DIAGNOSIS — I2510 Atherosclerotic heart disease of native coronary artery without angina pectoris: Secondary | ICD-10-CM | POA: Insufficient documentation

## 2014-08-27 DIAGNOSIS — F319 Bipolar disorder, unspecified: Secondary | ICD-10-CM | POA: Insufficient documentation

## 2014-08-27 DIAGNOSIS — S8992XA Unspecified injury of left lower leg, initial encounter: Secondary | ICD-10-CM | POA: Diagnosis not present

## 2014-08-27 DIAGNOSIS — I10 Essential (primary) hypertension: Secondary | ICD-10-CM | POA: Diagnosis not present

## 2014-08-27 DIAGNOSIS — I252 Old myocardial infarction: Secondary | ICD-10-CM | POA: Insufficient documentation

## 2014-08-27 DIAGNOSIS — Y9389 Activity, other specified: Secondary | ICD-10-CM | POA: Diagnosis not present

## 2014-08-27 DIAGNOSIS — Z8701 Personal history of pneumonia (recurrent): Secondary | ICD-10-CM | POA: Insufficient documentation

## 2014-08-27 DIAGNOSIS — M25512 Pain in left shoulder: Secondary | ICD-10-CM

## 2014-08-27 DIAGNOSIS — Y998 Other external cause status: Secondary | ICD-10-CM | POA: Diagnosis not present

## 2014-08-27 DIAGNOSIS — Y92410 Unspecified street and highway as the place of occurrence of the external cause: Secondary | ICD-10-CM | POA: Diagnosis not present

## 2014-08-27 DIAGNOSIS — E119 Type 2 diabetes mellitus without complications: Secondary | ICD-10-CM | POA: Diagnosis not present

## 2014-08-27 DIAGNOSIS — S3992XA Unspecified injury of lower back, initial encounter: Secondary | ICD-10-CM | POA: Insufficient documentation

## 2014-08-27 DIAGNOSIS — S4992XA Unspecified injury of left shoulder and upper arm, initial encounter: Secondary | ICD-10-CM | POA: Insufficient documentation

## 2014-08-27 DIAGNOSIS — Z79899 Other long term (current) drug therapy: Secondary | ICD-10-CM | POA: Diagnosis not present

## 2014-08-27 DIAGNOSIS — Z9889 Other specified postprocedural states: Secondary | ICD-10-CM | POA: Insufficient documentation

## 2014-08-27 DIAGNOSIS — Z8619 Personal history of other infectious and parasitic diseases: Secondary | ICD-10-CM | POA: Insufficient documentation

## 2014-08-27 DIAGNOSIS — Z8669 Personal history of other diseases of the nervous system and sense organs: Secondary | ICD-10-CM | POA: Insufficient documentation

## 2014-08-27 DIAGNOSIS — E669 Obesity, unspecified: Secondary | ICD-10-CM | POA: Diagnosis not present

## 2014-08-27 DIAGNOSIS — M545 Low back pain: Secondary | ICD-10-CM

## 2014-08-27 DIAGNOSIS — M25562 Pain in left knee: Secondary | ICD-10-CM

## 2014-08-27 DIAGNOSIS — Z86010 Personal history of colonic polyps: Secondary | ICD-10-CM | POA: Insufficient documentation

## 2014-08-27 DIAGNOSIS — I5022 Chronic systolic (congestive) heart failure: Secondary | ICD-10-CM | POA: Insufficient documentation

## 2014-08-27 MED ORDER — HYDROCODONE-ACETAMINOPHEN 5-325 MG PO TABS: 1.0000 | ORAL_TABLET | ORAL | Status: DC | PRN

## 2014-08-27 MED ORDER — HYDROCODONE-ACETAMINOPHEN 5-325 MG PO TABS
2.0000 | ORAL_TABLET | Freq: Once | ORAL | Status: AC
  Administered 2014-08-27: 2 via ORAL
  Filled 2014-08-27: qty 2
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
## 2014-09-08 ENCOUNTER — Emergency Department (HOSPITAL_BASED_OUTPATIENT_CLINIC_OR_DEPARTMENT_OTHER)
Admission: EM | Admit: 2014-09-08 | Discharge: 2014-09-08 | Disposition: A | Discharge status: Home / Self Care | Payer: No Typology Code available for payment source | Attending: Emergency Medicine | Admitting: Emergency Medicine

## 2014-09-08 ENCOUNTER — Encounter (HOSPITAL_BASED_OUTPATIENT_CLINIC_OR_DEPARTMENT_OTHER): Payer: Self-pay | Admitting: *Deleted

## 2014-09-08 DIAGNOSIS — Y92000 Kitchen of unspecified non-institutional (private) residence as  the place of occurrence of the external cause: Secondary | ICD-10-CM | POA: Insufficient documentation

## 2014-09-08 DIAGNOSIS — Z9981 Dependence on supplemental oxygen: Secondary | ICD-10-CM | POA: Insufficient documentation

## 2014-09-08 DIAGNOSIS — M549 Dorsalgia, unspecified: Secondary | ICD-10-CM | POA: Insufficient documentation

## 2014-09-08 DIAGNOSIS — E669 Obesity, unspecified: Secondary | ICD-10-CM | POA: Diagnosis not present

## 2014-09-08 DIAGNOSIS — G8911 Acute pain due to trauma: Secondary | ICD-10-CM | POA: Diagnosis not present

## 2014-09-08 DIAGNOSIS — Z794 Long term (current) use of insulin: Secondary | ICD-10-CM | POA: Insufficient documentation

## 2014-09-08 DIAGNOSIS — F4310 Post-traumatic stress disorder, unspecified: Secondary | ICD-10-CM | POA: Diagnosis not present

## 2014-09-08 DIAGNOSIS — I5022 Chronic systolic (congestive) heart failure: Secondary | ICD-10-CM | POA: Diagnosis not present

## 2014-09-08 DIAGNOSIS — Z8619 Personal history of other infectious and parasitic diseases: Secondary | ICD-10-CM | POA: Diagnosis not present

## 2014-09-08 DIAGNOSIS — F209 Schizophrenia, unspecified: Secondary | ICD-10-CM | POA: Diagnosis not present

## 2014-09-08 DIAGNOSIS — G4733 Obstructive sleep apnea (adult) (pediatric): Secondary | ICD-10-CM | POA: Diagnosis not present

## 2014-09-08 DIAGNOSIS — I10 Essential (primary) hypertension: Secondary | ICD-10-CM | POA: Insufficient documentation

## 2014-09-08 DIAGNOSIS — T23251A Burn of second degree of right palm, initial encounter: Secondary | ICD-10-CM | POA: Diagnosis not present

## 2014-09-08 DIAGNOSIS — I252 Old myocardial infarction: Secondary | ICD-10-CM | POA: Insufficient documentation

## 2014-09-08 DIAGNOSIS — F319 Bipolar disorder, unspecified: Secondary | ICD-10-CM | POA: Insufficient documentation

## 2014-09-08 DIAGNOSIS — Z86010 Personal history of colonic polyps: Secondary | ICD-10-CM | POA: Diagnosis not present

## 2014-09-08 DIAGNOSIS — E119 Type 2 diabetes mellitus without complications: Secondary | ICD-10-CM | POA: Diagnosis not present

## 2014-09-08 DIAGNOSIS — Z79899 Other long term (current) drug therapy: Secondary | ICD-10-CM | POA: Insufficient documentation

## 2014-09-08 DIAGNOSIS — Y93G3 Activity, cooking and baking: Secondary | ICD-10-CM | POA: Diagnosis not present

## 2014-09-08 DIAGNOSIS — Z9889 Other specified postprocedural states: Secondary | ICD-10-CM | POA: Diagnosis not present

## 2014-09-08 DIAGNOSIS — Z8701 Personal history of pneumonia (recurrent): Secondary | ICD-10-CM | POA: Insufficient documentation

## 2014-09-08 DIAGNOSIS — Z8673 Personal history of transient ischemic attack (TIA), and cerebral infarction without residual deficits: Secondary | ICD-10-CM | POA: Insufficient documentation

## 2014-09-08 DIAGNOSIS — T23201A Burn of second degree of right hand, unspecified site, initial encounter: Secondary | ICD-10-CM

## 2014-09-08 DIAGNOSIS — X150XXA Contact with hot stove (kitchen), initial encounter: Secondary | ICD-10-CM | POA: Insufficient documentation

## 2014-09-08 DIAGNOSIS — Y998 Other external cause status: Secondary | ICD-10-CM | POA: Diagnosis not present

## 2014-09-08 DIAGNOSIS — M25569 Pain in unspecified knee: Secondary | ICD-10-CM | POA: Insufficient documentation

## 2014-09-08 DIAGNOSIS — T23001A Burn of unspecified degree of right hand, unspecified site, initial encounter: Secondary | ICD-10-CM | POA: Diagnosis present

## 2014-09-08 DIAGNOSIS — Z87828 Personal history of other (healed) physical injury and trauma: Secondary | ICD-10-CM | POA: Insufficient documentation

## 2014-09-08 MED ORDER — FUROSEMIDE 40 MG PO TABS: 40.0000 mg | ORAL_TABLET | Freq: Two times a day (BID) | ORAL | Status: DC

## 2014-09-08 MED ORDER — SILVER SULFADIAZINE 1 % EX CREA
TOPICAL_CREAM | Freq: Once | CUTANEOUS | Status: AC
  Administered 2014-09-08: 1 via TOPICAL
  Filled 2014-09-08: qty 85

## 2014-09-08 MED ORDER — CYCLOBENZAPRINE HCL 10 MG PO TABS: 10.0000 mg | ORAL_TABLET | Freq: Two times a day (BID) | ORAL | Status: DC | PRN
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
## 2014-09-08 NOTE — Discharge Instructions (Signed)
Burn Care Your skin is a natural barrier to infection. It is the largest organ of your body. Burns damage this natural protection. To help prevent infection, it is very important to follow your caregiver's instructions in the care of your burn. Burns are classified as:  First degree. There is only redness of the skin (erythema). No scarring is expected.  Second degree. There is blistering of the skin. Scarring may occur with deeper burns.  Third degree. All layers of the skin are injured, and scarring is expected. HOME CARE INSTRUCTIONS   Wash your hands well before changing your bandage.  Change your bandage as often as directed by your caregiver.  Remove the old bandage. If the bandage sticks, you may soak it off with cool, clean water.  Cleanse the burn thoroughly but gently with mild soap and water.  Pat the area dry with a clean, dry cloth.  Apply a thin layer of antibacterial cream to the burn.  Apply a clean bandage as instructed by your caregiver.  Keep the bandage as clean and dry as possible.  Elevate the affected area for the first 24 hours, then as instructed by your caregiver.  Only take over-the-counter or prescription medicines for pain, discomfort, or fever as directed by your caregiver. SEEK IMMEDIATE MEDICAL CARE IF:   You develop excessive pain.  You develop redness, tenderness, swelling, or red streaks near the burn.  The burned area develops yellowish-white fluid (pus) or a bad smell.  You have a fever. MAKE SURE YOU:   Understand these instructions.  Will watch your condition.  Will get help right away if you are not doing well or get worse. Document Released: 07/17/2005 Document Revised: 10/09/2011 Document Reviewed: 12/07/2010 [employer] Patient Information 2015 [employer]. This information is not intended to replace advice given to you by your health care provider. Make sure you discuss any questions you have with your health care  provider.  

## 2014-09-09 ENCOUNTER — Emergency Department (INDEPENDENT_AMBULATORY_CARE_PROVIDER_SITE_OTHER)
Admission: EM | Admit: 2014-09-09 | Discharge: 2014-09-09 | Disposition: A | Discharge status: Home / Self Care | Payer: Self-pay | Source: Home / Self Care | Attending: Family Medicine | Admitting: Family Medicine

## 2014-09-09 ENCOUNTER — Encounter (HOSPITAL_COMMUNITY): Payer: Self-pay | Admitting: Emergency Medicine

## 2014-09-09 DIAGNOSIS — M545 Low back pain, unspecified: Secondary | ICD-10-CM

## 2014-09-09 DIAGNOSIS — M7042 Prepatellar bursitis, left knee: Secondary | ICD-10-CM

## 2014-09-09 DIAGNOSIS — M25512 Pain in left shoulder: Secondary | ICD-10-CM

## 2014-09-09 MED ORDER — HYDROCODONE-ACETAMINOPHEN 5-325 MG PO TABS: 1.0000 | ORAL_TABLET | Freq: Four times a day (QID) | ORAL | Status: DC | PRN

## 2014-09-09 NOTE — Discharge Instructions (Signed)
Thank you for coming in today. Follow-up with orthopedics Go to the emergency room if you get worse We cannot refill hydrocodone Come back or go to the emergency room if you notice new weakness new numbness problems walking or bowel or bladder problems.  Back Exercises Back exercises help treat and prevent back injuries. The goal is to increase your strength in your belly (abdominal) and back muscles. These exercises can also help with flexibility. Start these exercises when told by your doctor. HOME CARE Back exercises include: Pelvic Tilt.  Lie on your back with your knees bent. Tilt your pelvis until the lower part of your back is against the floor. Hold this position 5 to 10 sec. Repeat this exercise 5 to 10 times. Knee to Chest.  Pull 1 knee up against your chest and hold for 20 to 30 seconds. Repeat this with the other knee. This may be done with the other leg straight or bent, whichever feels better. Then, pull both knees up against your chest. Sit-Ups or Curl-Ups.  Bend your knees 90 degrees. Start with tilting your pelvis, and do a partial, slow sit-up. Only lift your upper half 30 to 45 degrees off the floor. Take at least 2 to 3 seonds for each sit-up. Do not do sit-ups with your knees out straight. If partial sit-ups are difficult, simply do the above but with only tightening your belly (abdominal) muscles and holding it as told. Hip-Lift.  Lie on your back with your knees flexed 90 degrees. Push down with your feet and shoulders as you raise your hips 2 inches off the floor. Hold for 10 seconds, repeat 5 to 10 times. Back Arches.  Lie on your stomach. Prop yourself up on bent elbows. Slowly press on your hands, causing an arch in your low back. Repeat 3 to 5 times. Shoulder-Lifts.  Lie face down with arms beside your body. Keep hips and belly pressed to floor as you slowly lift your head and shoulders off the floor. Do not overdo your exercises. Be careful in the beginning.  Exercises may cause you some mild back discomfort. If the pain lasts for more than 15 minutes, stop the exercises until you see your doctor. Improvement with exercise for back problems is slow.  Document Released: 08/19/2010 Document Revised: 10/09/2011 Document Reviewed: 05/18/2011 [facility] Patient Information 2015 [place], [place]. This information is not intended to replace advice given to you by your health care provider. Make sure you discuss any questions you have with your health care provider.
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
## 2014-09-17 ENCOUNTER — Emergency Department (HOSPITAL_COMMUNITY)
Admission: EM | Admit: 2014-09-17 | Discharge: 2014-09-17 | Disposition: A | Discharge status: Home / Self Care | Payer: No Typology Code available for payment source | Attending: Emergency Medicine | Admitting: Emergency Medicine

## 2014-09-17 ENCOUNTER — Encounter (HOSPITAL_COMMUNITY): Payer: Self-pay | Admitting: Emergency Medicine

## 2014-09-17 ENCOUNTER — Emergency Department (HOSPITAL_COMMUNITY): Payer: No Typology Code available for payment source

## 2014-09-17 DIAGNOSIS — Z79899 Other long term (current) drug therapy: Secondary | ICD-10-CM | POA: Insufficient documentation

## 2014-09-17 DIAGNOSIS — Y998 Other external cause status: Secondary | ICD-10-CM | POA: Insufficient documentation

## 2014-09-17 DIAGNOSIS — I10 Essential (primary) hypertension: Secondary | ICD-10-CM | POA: Insufficient documentation

## 2014-09-17 DIAGNOSIS — F319 Bipolar disorder, unspecified: Secondary | ICD-10-CM | POA: Diagnosis not present

## 2014-09-17 DIAGNOSIS — E119 Type 2 diabetes mellitus without complications: Secondary | ICD-10-CM | POA: Insufficient documentation

## 2014-09-17 DIAGNOSIS — Z8673 Personal history of transient ischemic attack (TIA), and cerebral infarction without residual deficits: Secondary | ICD-10-CM | POA: Diagnosis not present

## 2014-09-17 DIAGNOSIS — G4733 Obstructive sleep apnea (adult) (pediatric): Secondary | ICD-10-CM | POA: Diagnosis not present

## 2014-09-17 DIAGNOSIS — Z86010 Personal history of colonic polyps: Secondary | ICD-10-CM | POA: Diagnosis not present

## 2014-09-17 DIAGNOSIS — Z8619 Personal history of other infectious and parasitic diseases: Secondary | ICD-10-CM | POA: Insufficient documentation

## 2014-09-17 DIAGNOSIS — I5022 Chronic systolic (congestive) heart failure: Secondary | ICD-10-CM | POA: Diagnosis not present

## 2014-09-17 DIAGNOSIS — I252 Old myocardial infarction: Secondary | ICD-10-CM | POA: Diagnosis not present

## 2014-09-17 DIAGNOSIS — Y9389 Activity, other specified: Secondary | ICD-10-CM | POA: Insufficient documentation

## 2014-09-17 DIAGNOSIS — Y92008 Other place in unspecified non-institutional (private) residence as the place of occurrence of the external cause: Secondary | ICD-10-CM | POA: Diagnosis not present

## 2014-09-17 DIAGNOSIS — E669 Obesity, unspecified: Secondary | ICD-10-CM | POA: Insufficient documentation

## 2014-09-17 DIAGNOSIS — Z9889 Other specified postprocedural states: Secondary | ICD-10-CM | POA: Diagnosis not present

## 2014-09-17 DIAGNOSIS — F4310 Post-traumatic stress disorder, unspecified: Secondary | ICD-10-CM | POA: Diagnosis not present

## 2014-09-17 DIAGNOSIS — Z8701 Personal history of pneumonia (recurrent): Secondary | ICD-10-CM | POA: Insufficient documentation

## 2014-09-17 DIAGNOSIS — S86812A Strain of other muscle(s) and tendon(s) at lower leg level, left leg, initial encounter: Secondary | ICD-10-CM | POA: Insufficient documentation

## 2014-09-17 DIAGNOSIS — Z794 Long term (current) use of insulin: Secondary | ICD-10-CM | POA: Insufficient documentation

## 2014-09-17 DIAGNOSIS — S8992XA Unspecified injury of left lower leg, initial encounter: Secondary | ICD-10-CM | POA: Diagnosis present

## 2014-09-17 DIAGNOSIS — X58XXXA Exposure to other specified factors, initial encounter: Secondary | ICD-10-CM | POA: Insufficient documentation

## 2014-09-17 DIAGNOSIS — S86912A Strain of unspecified muscle(s) and tendon(s) at lower leg level, left leg, initial encounter: Secondary | ICD-10-CM

## 2014-09-17 LAB — CBG MONITORING, ED: Glucose-Capillary: 388 mg/dL — ABNORMAL HIGH (ref 70–99)

## 2014-09-17 MED ORDER — OXYCODONE-ACETAMINOPHEN 5-325 MG PO TABS: 1.0000 | ORAL_TABLET | ORAL | Status: DC | PRN

## 2014-09-17 MED ORDER — ACETAMINOPHEN 500 MG PO TABS
1000.0000 mg | ORAL_TABLET | Freq: Once | ORAL | Status: AC
Start: 2014-09-17 — End: 2014-09-17
  Administered 2014-09-17: 1000 mg via ORAL
  Filled 2014-09-17: qty 2

## 2014-09-17 NOTE — Discharge Instructions (Signed)
X-ray shows no fracture. Ice, elevate, knee brace, crutches, pain medicine. Referral to orthopedic surgeon. Phone number given.
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
## 2014-09-17 NOTE — ED Notes (Signed)
Pt CBG reported to RN.

## 2014-09-17 NOTE — ED Provider Notes (Signed)
CSN: [id number]     Arrival date & time 09/17/14  2105 History   First MD Initiated Contact with Patient 09/17/14 2112     Chief Complaint  Patient presents with  . Knee Injury     (Consider location/radiation/quality/duration/timing/severity/associated sxs/prior Treatment) HPI....status post knee injury tonight. Patient was at home playing with his daughter when his knee rotated inwardly. He heard a large popping noise. He has trouble bearing weight. No other injuries. Severity is moderate.  Past Medical History  Diagnosis Date  . Hypertension   . Hyperlipidemia   . CVA (cerebral infarction)     No residual deficits  . Non-ischemic cardiomyopathy     No ischemia on myoview, showed EF of 42%. 2D echo showed EF 50-55% with diatolic dysfunction in 2009  . Post-cardiac injury syndrome     History of cardiac injury from blunt trauma  . PVCs (premature ventricular contractions)   . History of colonic polyps   . Obesity   . Syncope     Recurrent, thought to be vasovagal. Also has h/o frequent PVCs.   . Chronic systolic heart failure 11/2010    a. NICM 4/14 EF 20-25%, TR mild  . SOB (shortness of breath)   . 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 Headache(784.0)   . Diabetes mellitus     TYPE II; UNCONTROLLED BY HEMOGLOBIN A1c; STABLE AS  PER DISCHARGE  . CHF (congestive heart failure)   . Myocardial infarction     at age 35 years old (while playing football)  . Stroke 2005    some left side weakness  . OSA (obstructive sleep apnea)     Being set up again for c-pap  . Pneumonia   . Depression     PTSD,   . Bipolar disorder   . Schizophrenia     Goes to [facility]   Past Surgical History  Procedure Laterality Date  . Cardiac catheterization  12/19/10    DIFFUSE NONOBSTRUCTIVE CAD; NONISCHEMIC CARDIOMYOPATHY; LEFT VENTRICULAR ANGIOGRAM WAS PERFORMED SECONDARY TO  ELEVATED LEFT VENTRICULAR FILLING PRESSURES  . Orif finger / thumb fracture Right   . Colonoscopy  w/ polypectomy    . Multiple extractions with alveoloplasty  01/27/2014    "all my teeth; 4 Quadrants of alveoloplasty  . Multiple extractions with alveoloplasty N/A 01/27/2014    Procedure: EXTRACTION OF TEETH #'1, 2, 3, 4, 5, 6, 7, 8, 9, 10, 11, 12, 13, 14, 15, 16, 17, 20, 21, 22, 23, 24, 25, 26, 27, 28, 29, 31 and 32 WITH ALVEOLOPLASTY;  Surgeon: Charlynne Pander, DDS;  Location: [facility] OR;  Service: Oral Surgery;  Laterality: N/A;   Family History  Problem Relation Age of Onset  . Heart disease Mother     MI  . Heart failure Mother   . Diabetes Mother     ALSO IN MOST OF HIS SIBLINGS; 2 UNLCES HAVE ALSO PASSED AWAY FROM DM  . Cardiomyopathy Mother   . Cancer - Ovarian Mother   . Heart disease Father   . Hypertension Father   . Diabetes Father    History  Substance Use Topics  . Smoking status: Never Smoker   . Smokeless tobacco: Never Used  . Alcohol Use: No    Review of Systems  All other systems reviewed and are negative.     Allergies  Nsaids  Home Medications   Prior to Admission medications   Medication Sig Start Date End Date Taking? Authorizing Provider  amLODipine (NORVASC) 5 MG tablet Take 1 tablet (5 mg total) by mouth daily. 06/08/14  Yes Amy D Clegg, NP  carvedilol (COREG) 25 MG tablet Take 1 tablet (25 mg total) by mouth 2 (two) times daily with a meal. 02/07/14  Yes Lonia Skinner Sofia, PA-C  cyclobenzaprine (FLEXERIL) 10 MG tablet Take 1 tablet (10 mg total) by mouth 2 (two) times daily as needed for muscle spasms. 09/08/14  Yes Nelia Shi, MD  digoxin (LANOXIN) 0.25 MG tablet Take 0.25 mg by mouth daily.   Yes Historical Provider, MD  furosemide (LASIX) 40 MG tablet Take 1 tablet (40 mg total) by mouth 2 (two) times daily. 09/08/14  Yes Nelia Shi, MD  HYDROcodone-acetaminophen (NORCO/VICODIN) 5-325 MG per tablet Take 1 tablet by mouth every 6 (six) hours as needed. 09/09/14  Yes Rodolph Bong, MD  insulin NPH-regular Human (NOVOLIN 70/30) (70-30) 100 UNIT/ML  injection Inject 40 Units into the skin 2 (two) times daily with a meal. 02/04/14  Yes Amy D Clegg, NP  losartan (COZAAR) 100 MG tablet Take 50-100 mg by mouth 2 (two) times daily. Takes 100 mg in the morning and 50 mg in the evening   Yes Historical Provider, MD  magnesium oxide (MAG-OX) 400 MG tablet Take 400 mg by mouth daily.   Yes Historical Provider, MD  QUEtiapine (SEROQUEL) 100 MG tablet Take 1 tablet (100 mg total) by mouth at bedtime. 11/25/13  Yes Renae Fickle, MD  sertraline (ZOLOFT) 100 MG tablet Take 1 tablet (100 mg total) by mouth 2 (two) times daily. 11/25/13  Yes Renae Fickle, MD  spironolactone (ALDACTONE) 50 MG tablet Take 1 tablet (50 mg total) by mouth daily. 11/25/13 11/25/14 Yes Renae Fickle, MD  oxyCODONE-acetaminophen (PERCOCET) 5-325 MG per tablet Take 1 tablet by mouth every 4 (four) hours as needed. 09/17/14   Donnetta Hutching, MD   BP 171/110 mmHg  Pulse 107  Temp(Src) 98 F (36.7 C) (Oral)  Resp 22  SpO2 100% Physical Exam  Constitutional: He is oriented to person, place, and time. He appears well-developed and well-nourished.  HENT:  Head: Normocephalic and atraumatic.  Eyes: Conjunctivae and EOM are normal. Pupils are equal, round, and reactive to light.  Neck: Normal range of motion. Neck supple.  Cardiovascular: Normal rate and regular rhythm.   Pulmonary/Chest: Effort normal and breath sounds normal.  Abdominal: Soft. Bowel sounds are normal.  Musculoskeletal:  Left knee: Most tender in the medial aspect of the knee. Patient unable to flex and extend without pain  Neurological: He is alert and oriented to person, place, and time.  Skin: Skin is warm and dry.  Psychiatric: He has a normal mood and affect. His behavior is normal.  Nursing note and vitals reviewed.   ED Course  Procedures (including critical care time) Labs Review Labs Reviewed  CBG MONITORING, ED - Abnormal; Notable for the following:    Glucose-Capillary 388 (*)    All other  components within normal limits    Imaging Review Dg Knee Complete 4 Views Left  09/17/2014   CLINICAL DATA:  Larey Seat from bed, hyperextending knee.  EXAM: LEFT KNEE - COMPLETE 4+ VIEW  COMPARISON:  None.  FINDINGS: Negative for fracture, dislocation or radiopaque foreign body. There is prepatellar soft tissue swelling.  IMPRESSION: Negative for fracture or dislocation   Electronically Signed   By: Ellery Plunk M.D.   On: 09/17/2014 21:41     EKG Interpretation None      MDM  Final diagnoses:  Knee strain, left, initial encounter    Plain films of left knee show no fracture or dislocation. Knee immobilizer, crutches, pain medication, referral to orthopedic surgeon. Suspect medial collateral ligament injury    Donnetta Hutching, MD 09/17/14 2250

## 2014-09-17 NOTE — ED Notes (Signed)
Bed: GQ67 Expected date:  Expected time:  Means of arrival:  Comments: EMS/knee

## 2014-10-05 ENCOUNTER — Emergency Department (HOSPITAL_BASED_OUTPATIENT_CLINIC_OR_DEPARTMENT_OTHER)
Admission: EM | Admit: 2014-10-05 | Discharge: 2014-10-05 | Disposition: A | Discharge status: Home / Self Care | Payer: No Typology Code available for payment source | Attending: Emergency Medicine | Admitting: Emergency Medicine

## 2014-10-05 ENCOUNTER — Encounter (HOSPITAL_BASED_OUTPATIENT_CLINIC_OR_DEPARTMENT_OTHER): Payer: Self-pay | Admitting: *Deleted

## 2014-10-05 DIAGNOSIS — Z86010 Personal history of colonic polyps: Secondary | ICD-10-CM | POA: Insufficient documentation

## 2014-10-05 DIAGNOSIS — Z8619 Personal history of other infectious and parasitic diseases: Secondary | ICD-10-CM | POA: Diagnosis not present

## 2014-10-05 DIAGNOSIS — I10 Essential (primary) hypertension: Secondary | ICD-10-CM | POA: Diagnosis not present

## 2014-10-05 DIAGNOSIS — I252 Old myocardial infarction: Secondary | ICD-10-CM | POA: Diagnosis not present

## 2014-10-05 DIAGNOSIS — Z8673 Personal history of transient ischemic attack (TIA), and cerebral infarction without residual deficits: Secondary | ICD-10-CM | POA: Insufficient documentation

## 2014-10-05 DIAGNOSIS — Z87828 Personal history of other (healed) physical injury and trauma: Secondary | ICD-10-CM | POA: Diagnosis not present

## 2014-10-05 DIAGNOSIS — E669 Obesity, unspecified: Secondary | ICD-10-CM | POA: Insufficient documentation

## 2014-10-05 DIAGNOSIS — Z79899 Other long term (current) drug therapy: Secondary | ICD-10-CM | POA: Insufficient documentation

## 2014-10-05 DIAGNOSIS — Z8701 Personal history of pneumonia (recurrent): Secondary | ICD-10-CM | POA: Insufficient documentation

## 2014-10-05 DIAGNOSIS — F319 Bipolar disorder, unspecified: Secondary | ICD-10-CM | POA: Insufficient documentation

## 2014-10-05 DIAGNOSIS — E119 Type 2 diabetes mellitus without complications: Secondary | ICD-10-CM | POA: Insufficient documentation

## 2014-10-05 DIAGNOSIS — I509 Heart failure, unspecified: Secondary | ICD-10-CM | POA: Insufficient documentation

## 2014-10-05 DIAGNOSIS — M25562 Pain in left knee: Secondary | ICD-10-CM | POA: Diagnosis present

## 2014-10-05 DIAGNOSIS — Z9889 Other specified postprocedural states: Secondary | ICD-10-CM | POA: Diagnosis not present

## 2014-10-05 DIAGNOSIS — I5022 Chronic systolic (congestive) heart failure: Secondary | ICD-10-CM | POA: Diagnosis not present

## 2014-10-05 MED ORDER — OXYCODONE-ACETAMINOPHEN 5-325 MG PO TABS: 1.0000 | ORAL_TABLET | Freq: Four times a day (QID) | ORAL | Status: DC | PRN

## 2014-10-05 MED ORDER — HYDROMORPHONE HCL 1 MG/ML IJ SOLN
1.0000 mg | Freq: Once | INTRAMUSCULAR | Status: AC
  Administered 2014-10-05: 1 mg via INTRAMUSCULAR
  Filled 2014-10-05: qty 1

## 2014-10-05 NOTE — ED Provider Notes (Signed)
CSN: [id number]     Arrival date & time 10/05/14  1720 History   First MD Initiated Contact with Patient 10/05/14 1733     Chief Complaint  Patient presents with  . Knee Pain     (Consider location/radiation/quality/duration/timing/severity/associated sxs/prior Treatment) HPI Mitchell [name] is a 44 year old male who presents the ER complaining of left knee pain. Patient states approximately 2 weeks ago he injured his knee, and presented to the ER at that time. Patient states he was evaluated for his knee, and was referred to his primary care provider, however he states he has been unable to get an appointment with his provider. Patient states the pain has been persistent since then, and he is out of his  medicine he has been prescribed. Patient states he denies any new injury or new symptoms, only persistent pain. Patient denies numbness, weakness, tingling, loss of sensation or function. Patient states his pain is worse with bearing weight on his knee, and worse when using for a long period of time.  Past Medical History  Diagnosis Date  . Hypertension   . Hyperlipidemia   . CVA (cerebral infarction)     No residual deficits  . Non-ischemic cardiomyopathy     No ischemia on myoview, showed EF of 42%. 2D echo showed EF 50-55% with diatolic dysfunction in 2009  . Post-cardiac injury syndrome     History of cardiac injury from blunt trauma  . PVCs (premature ventricular contractions)   . History of colonic polyps   . Obesity   . Syncope     Recurrent, thought to be vasovagal. Also has h/o frequent PVCs.   . Chronic systolic heart failure 11/2010    a. NICM 4/14 EF 20-25%, TR mild  . SOB (shortness of breath)   . 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 Headache(784.0)   . Diabetes mellitus     TYPE II; UNCONTROLLED BY HEMOGLOBIN A1c; STABLE AS  PER DISCHARGE  . CHF (congestive heart failure)   . Myocardial infarction     at age 76 years old (while playing football)  . Stroke 2005     some left side weakness  . OSA (obstructive sleep apnea)     Being set up again for c-pap  . Pneumonia   . Depression     PTSD,   . Bipolar disorder   . Schizophrenia     Goes to [facility]   Past Surgical History  Procedure Laterality Date  . Cardiac catheterization  12/19/10    DIFFUSE NONOBSTRUCTIVE CAD; NONISCHEMIC CARDIOMYOPATHY; LEFT VENTRICULAR ANGIOGRAM WAS PERFORMED SECONDARY TO  ELEVATED LEFT VENTRICULAR FILLING PRESSURES  . Orif finger / thumb fracture Right   . Colonoscopy w/ polypectomy    . Multiple extractions with alveoloplasty  01/27/2014    "all my teeth; 4 Quadrants of alveoloplasty  . Multiple extractions with alveoloplasty N/A 01/27/2014    Procedure: EXTRACTION OF TEETH #'1, 2, 3, 4, 5, 6, 7, 8, 9, 10, 11, 12, 13, 14, 15, 16, 17, 20, 21, 22, 23, 24, 25, 26, 27, 28, 29, 31 and 32 WITH ALVEOLOPLASTY;  Surgeon: Charlynne Pander, DDS;  Location: [facility] OR;  Service: Oral Surgery;  Laterality: N/A;   Family History  Problem Relation Age of Onset  . Heart disease Mother     MI  . Heart failure Mother   . Diabetes Mother     ALSO IN MOST OF HIS SIBLINGS; 2 UNLCES HAVE ALSO PASSED AWAY FROM DM  .  Cardiomyopathy Mother   . Cancer - Ovarian Mother   . Heart disease Father   . Hypertension Father   . Diabetes Father    History  Substance Use Topics  . Smoking status: Never Smoker   . Smokeless tobacco: Never Used  . Alcohol Use: No    Review of Systems  Musculoskeletal: Positive for arthralgias.  Neurological: Negative for weakness and numbness.      Allergies  Nsaids  Home Medications   Prior to Admission medications   Medication Sig Start Date End Date Taking? Authorizing Provider  amLODipine (NORVASC) 5 MG tablet Take 1 tablet (5 mg total) by mouth daily. 06/08/14   Amy D Filbert Schilder, NP  carvedilol (COREG) 25 MG tablet Take 1 tablet (25 mg total) by mouth 2 (two) times daily with a meal. 02/07/14   Elson Areas, PA-C  cyclobenzaprine  (FLEXERIL) 10 MG tablet Take 1 tablet (10 mg total) by mouth 2 (two) times daily as needed for muscle spasms. 09/08/14   Nelva Nay, MD  digoxin (LANOXIN) 0.25 MG tablet Take 0.25 mg by mouth daily.    Historical Provider, MD  furosemide (LASIX) 40 MG tablet Take 1 tablet (40 mg total) by mouth 2 (two) times daily. 09/08/14   Nelva Nay, MD  HYDROcodone-acetaminophen (NORCO/VICODIN) 5-325 MG per tablet Take 1 tablet by mouth every 6 (six) hours as needed. 09/09/14   Rodolph Bong, MD  insulin NPH-regular Human (NOVOLIN 70/30) (70-30) 100 UNIT/ML injection Inject 40 Units into the skin 2 (two) times daily with a meal. 02/04/14   Amy D Clegg, NP  losartan (COZAAR) 100 MG tablet Take 50-100 mg by mouth 2 (two) times daily. Takes 100 mg in the morning and 50 mg in the evening    Historical Provider, MD  magnesium oxide (MAG-OX) 400 MG tablet Take 400 mg by mouth daily.    Historical Provider, MD  oxyCODONE-acetaminophen (PERCOCET) 5-325 MG per tablet Take 1 tablet by mouth every 4 (four) hours as needed. 09/17/14   Donnetta Hutching, MD  oxyCODONE-acetaminophen (PERCOCET) 5-325 MG per tablet Take 1 tablet by mouth every 6 (six) hours as needed. 10/05/14   Ladona Mow, PA-C  QUEtiapine (SEROQUEL) 100 MG tablet Take 1 tablet (100 mg total) by mouth at bedtime. 11/25/13   Renae Fickle, MD  sertraline (ZOLOFT) 100 MG tablet Take 1 tablet (100 mg total) by mouth 2 (two) times daily. 11/25/13   Renae Fickle, MD  spironolactone (ALDACTONE) 50 MG tablet Take 1 tablet (50 mg total) by mouth daily. 11/25/13 11/25/14  Renae Fickle, MD   BP 160/100 mmHg  Pulse 96  Temp(Src) 97.8 F (36.6 C) (Oral)  Resp 16  Ht 6' (1.829 m)  Wt 282 lb (127.914 kg)  BMI 38.24 kg/m2  SpO2 99% Physical Exam  Constitutional: He appears well-developed and well-nourished. No distress.  HENT:  Head: Normocephalic and atraumatic.  Eyes: Conjunctivae are normal. Right eye exhibits no discharge. Left eye exhibits no discharge. No scleral  icterus.  Cardiovascular:  Peripheral pulses intact at injured extremity.   Pulmonary/Chest: Effort normal. No respiratory distress.  Musculoskeletal:  Left knee exam: Mild effusion noted over anterior patellar region. Patient has medial joint line tenderness to palpation. Patient has full active and passive range of motion with mild medial joint line pain. No obvious erythema, warmth, edema area no anterior/posterior or medial/lateral instability. DP pulse 2+. Distal sensation intact. Motor strength 5 out of 5 at hip, knee, ankle.  Neurological: He is alert.  No numbness distal to injury.    Skin: Skin is warm and dry. No rash noted. He is not diaphoretic.  Nursing note and vitals reviewed.   ED Course  Procedures (including critical care time) Labs Review Labs Reviewed - No data to display  Imaging Review No results found.   EKG Interpretation None      MDM   Final diagnoses:  Knee pain, left   Pt here with ongoing knee pain s/p injury 2 wks ago.  Pt states he was confused about with whom he should f/u because he was referred to both an orthopedic surgeon and his PCP, so he therefore f/u with nobody.  He states he is out of pain medicine and is still in pain. Pain treated here, pt referred to ortho.  No new injury, exam consistent with a probable soft-tissue knee injury. Radiographs reviewed from previous visit.  No concerning s/s for new injury or infection.  Pt d/c with symptomatic tx and strongly encouraged to f/u with orthopedics.  Pt notified that the ER is not an appropriate place to continue to fill narcotic Rx for ongoing pain, and he will need f/u with either PCP or orthopedic surgery for further evaluation.    BP 160/100 mmHg  Pulse 96  Temp(Src) 97.8 F (36.6 C) (Oral)  Resp 16  Ht 6' (1.829 m)  Wt 282 lb (127.914 kg)  BMI 38.24 kg/m2  SpO2 99%  Signed,  Ladona Mow, PA-C 1:02 PM    Ladona Mow, PA-C 10/06/14 1302  Nelva Nay, MD 10/09/14 1028

## 2014-10-05 NOTE — Discharge Instructions (Signed)

## 2014-10-05 NOTE — ED Notes (Signed)
Pt injured his left knee 2 weeks ago. He has been seen for same but the pain persists.

## 2014-11-16 ENCOUNTER — Emergency Department (HOSPITAL_COMMUNITY): Admission: EM | Admit: 2014-11-16 | Discharge: 2014-11-16 | Discharge status: Absent without leave | Payer: Self-pay

## 2014-11-21 ENCOUNTER — Encounter (HOSPITAL_BASED_OUTPATIENT_CLINIC_OR_DEPARTMENT_OTHER): Payer: Self-pay | Admitting: *Deleted

## 2014-11-21 ENCOUNTER — Emergency Department (HOSPITAL_BASED_OUTPATIENT_CLINIC_OR_DEPARTMENT_OTHER)
Admission: EM | Admit: 2014-11-21 | Discharge: 2014-11-22 | Disposition: A | Discharge status: Home / Self Care | Payer: No Typology Code available for payment source | Attending: Emergency Medicine | Admitting: Emergency Medicine

## 2014-11-21 DIAGNOSIS — I252 Old myocardial infarction: Secondary | ICD-10-CM | POA: Insufficient documentation

## 2014-11-21 DIAGNOSIS — L97519 Non-pressure chronic ulcer of other part of right foot with unspecified severity: Secondary | ICD-10-CM

## 2014-11-21 DIAGNOSIS — Z79899 Other long term (current) drug therapy: Secondary | ICD-10-CM | POA: Insufficient documentation

## 2014-11-21 DIAGNOSIS — E1165 Type 2 diabetes mellitus with hyperglycemia: Secondary | ICD-10-CM | POA: Insufficient documentation

## 2014-11-21 DIAGNOSIS — E669 Obesity, unspecified: Secondary | ICD-10-CM | POA: Insufficient documentation

## 2014-11-21 DIAGNOSIS — I5022 Chronic systolic (congestive) heart failure: Secondary | ICD-10-CM | POA: Diagnosis not present

## 2014-11-21 DIAGNOSIS — I10 Essential (primary) hypertension: Secondary | ICD-10-CM | POA: Insufficient documentation

## 2014-11-21 DIAGNOSIS — R0602 Shortness of breath: Secondary | ICD-10-CM | POA: Insufficient documentation

## 2014-11-21 DIAGNOSIS — F319 Bipolar disorder, unspecified: Secondary | ICD-10-CM | POA: Diagnosis not present

## 2014-11-21 DIAGNOSIS — Z8673 Personal history of transient ischemic attack (TIA), and cerebral infarction without residual deficits: Secondary | ICD-10-CM | POA: Insufficient documentation

## 2014-11-21 DIAGNOSIS — Z8619 Personal history of other infectious and parasitic diseases: Secondary | ICD-10-CM | POA: Diagnosis not present

## 2014-11-21 DIAGNOSIS — Z9889 Other specified postprocedural states: Secondary | ICD-10-CM | POA: Diagnosis not present

## 2014-11-21 DIAGNOSIS — E785 Hyperlipidemia, unspecified: Secondary | ICD-10-CM | POA: Diagnosis not present

## 2014-11-21 DIAGNOSIS — Z86010 Personal history of colonic polyps: Secondary | ICD-10-CM | POA: Insufficient documentation

## 2014-11-21 DIAGNOSIS — Z794 Long term (current) use of insulin: Secondary | ICD-10-CM | POA: Diagnosis not present

## 2014-11-21 DIAGNOSIS — L97509 Non-pressure chronic ulcer of other part of unspecified foot with unspecified severity: Secondary | ICD-10-CM | POA: Diagnosis not present

## 2014-11-21 DIAGNOSIS — Z9981 Dependence on supplemental oxygen: Secondary | ICD-10-CM | POA: Insufficient documentation

## 2014-11-21 DIAGNOSIS — J810 Acute pulmonary edema: Secondary | ICD-10-CM | POA: Diagnosis not present

## 2014-11-21 DIAGNOSIS — E876 Hypokalemia: Secondary | ICD-10-CM | POA: Insufficient documentation

## 2014-11-21 DIAGNOSIS — F209 Schizophrenia, unspecified: Secondary | ICD-10-CM | POA: Insufficient documentation

## 2014-11-21 DIAGNOSIS — Z8701 Personal history of pneumonia (recurrent): Secondary | ICD-10-CM | POA: Diagnosis not present

## 2014-11-21 DIAGNOSIS — E11621 Type 2 diabetes mellitus with foot ulcer: Secondary | ICD-10-CM | POA: Insufficient documentation

## 2014-11-21 DIAGNOSIS — G4733 Obstructive sleep apnea (adult) (pediatric): Secondary | ICD-10-CM | POA: Insufficient documentation

## 2014-11-21 DIAGNOSIS — R739 Hyperglycemia, unspecified: Secondary | ICD-10-CM

## 2014-11-21 NOTE — ED Provider Notes (Signed)
CSN: [id number]     Arrival date & time 11/21/14  2317 History  This chart was scribed for Loren Racer, MD by [place] Peace, ED Scribe. The patient was seen in [facility]. The patient's care was started at 12:07 PM.    Chief Complaint  Patient presents with  . Shortness of Breath  . Foot Injury      Patient is a 44 y.o. male presenting with shortness of breath and foot injury. The history is provided by the patient. No language interpreter was used.  Shortness of Breath Associated symptoms: cough   Associated symptoms: no abdominal pain, no chest pain, no fever, no headaches, no neck pain, no rash and no vomiting   Foot Injury Associated symptoms: no back pain, no fatigue, no fever and no neck pain   HPI Comments: Mitchell [name] is a 44 y.o. male who presents to the Emergency Department complaining of wound to bottom of pt's right foot that was first noticed by his wife last night. Pt notes wound has been "oozing" and has since become mildly painful. Pt states he has been taking his Lasix medication like prescribed. History of DM Type II.  Pt notes an increased activity in coughing and SOB which he attributes to his history of CHF. Worsened with lying flat. No fever or chills. Denies any chest pain.  Pt also complains of erectile dysfunction. No complaints of penile discharge or lesions to penis. Pt expresses he believes this is related to his DM.    Past Medical History  Diagnosis Date  . Hypertension   . Hyperlipidemia   . CVA (cerebral infarction)     No residual deficits  . Non-ischemic cardiomyopathy     No ischemia on myoview, showed EF of 42%. 2D echo showed EF 50-55% with diatolic dysfunction in 2009  . Post-cardiac injury syndrome     History of cardiac injury from blunt trauma  . PVCs (premature ventricular contractions)   . History of colonic polyps   . Obesity   . Syncope     Recurrent, thought to be vasovagal. Also has h/o frequent PVCs.   . Chronic systolic  heart failure 11/2010    a. NICM 4/14 EF 20-25%, TR mild  . SOB (shortness of breath)   . 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 Headache(784.0)   . Diabetes mellitus     TYPE II; UNCONTROLLED BY HEMOGLOBIN A1c; STABLE AS  PER DISCHARGE  . CHF (congestive heart failure)   . Myocardial infarction     at age 47 years old (while playing football)  . Stroke 2005    some left side weakness  . OSA (obstructive sleep apnea)     Being set up again for c-pap  . Pneumonia   . Depression     PTSD,   . Bipolar disorder   . Schizophrenia     Goes to [facility]   Past Surgical History  Procedure Laterality Date  . Cardiac catheterization  12/19/10    DIFFUSE NONOBSTRUCTIVE CAD; NONISCHEMIC CARDIOMYOPATHY; LEFT VENTRICULAR ANGIOGRAM WAS PERFORMED SECONDARY TO  ELEVATED LEFT VENTRICULAR FILLING PRESSURES  . Orif finger / thumb fracture Right   . Colonoscopy w/ polypectomy    . Multiple extractions with alveoloplasty  01/27/2014    "all my teeth; 4 Quadrants of alveoloplasty  . Multiple extractions with alveoloplasty N/A 01/27/2014    Procedure: EXTRACTION OF TEETH #'1, 2, 3, 4, 5, 6, 7, 8, 9, 10, 11, 12, 13, 14,  15, 16, 17, 20, 21, 22, 23, 24, 25, 26, 27, 28, 29, 31 and 32 WITH ALVEOLOPLASTY;  Surgeon: Charlynne Pander, DDS;  Location: [facility] OR;  Service: Oral Surgery;  Laterality: N/A;   Family History  Problem Relation Age of Onset  . Heart disease Mother     MI  . Heart failure Mother   . Diabetes Mother     ALSO IN MOST OF HIS SIBLINGS; 2 UNLCES HAVE ALSO PASSED AWAY FROM DM  . Cardiomyopathy Mother   . Cancer - Ovarian Mother   . Heart disease Father   . Hypertension Father   . Diabetes Father    History  Substance Use Topics  . Smoking status: Never Smoker   . Smokeless tobacco: Never Used  . Alcohol Use: No    Review of Systems  Constitutional: Negative for fever, chills and fatigue.  Respiratory: Positive for cough and shortness of breath.   Cardiovascular:  Negative for chest pain, palpitations and leg swelling.  Gastrointestinal: Negative for nausea, vomiting, abdominal pain and diarrhea.  Genitourinary: Negative for discharge, penile swelling, scrotal swelling, penile pain and testicular pain.  Musculoskeletal: Negative for back pain, neck pain and neck stiffness.  Skin: Positive for wound. Negative for rash.       "oozing" sore on the bottom of right foot.   Neurological: Negative for dizziness, weakness, light-headedness, numbness and headaches.  All other systems reviewed and are negative.     Allergies  Nsaids  Home Medications   Prior to Admission medications   Medication Sig Start Date End Date Taking? Authorizing Provider  amLODipine (NORVASC) 5 MG tablet Take 1 tablet (5 mg total) by mouth daily. 06/08/14   Amy D Filbert Schilder, NP  carvedilol (COREG) 25 MG tablet Take 1 tablet (25 mg total) by mouth 2 (two) times daily with a meal. 02/07/14   Elson Areas, PA-C  cyclobenzaprine (FLEXERIL) 10 MG tablet Take 1 tablet (10 mg total) by mouth 2 (two) times daily as needed for muscle spasms. 09/08/14   Nelva Nay, MD  digoxin (LANOXIN) 0.25 MG tablet Take 0.25 mg by mouth daily.    Historical Provider, MD  furosemide (LASIX) 40 MG tablet Take 1 tablet (40 mg total) by mouth 2 (two) times daily. 09/08/14   Nelva Nay, MD  HYDROcodone-acetaminophen (NORCO/VICODIN) 5-325 MG per tablet Take 1 tablet by mouth every 6 (six) hours as needed. 09/09/14   Rodolph Bong, MD  insulin NPH-regular Human (NOVOLIN 70/30) (70-30) 100 UNIT/ML injection Inject 40 Units into the skin 2 (two) times daily with a meal. 02/04/14   Amy D Clegg, NP  losartan (COZAAR) 100 MG tablet Take 50-100 mg by mouth 2 (two) times daily. Takes 100 mg in the morning and 50 mg in the evening    Historical Provider, MD  magnesium oxide (MAG-OX) 400 MG tablet Take 400 mg by mouth daily.    Historical Provider, MD  oxyCODONE-acetaminophen (PERCOCET) 5-325 MG per tablet Take 1 tablet by  mouth every 4 (four) hours as needed. 09/17/14   Donnetta Hutching, MD  oxyCODONE-acetaminophen (PERCOCET) 5-325 MG per tablet Take 1 tablet by mouth every 6 (six) hours as needed. 10/05/14   Ladona Mow, PA-C  QUEtiapine (SEROQUEL) 100 MG tablet Take 1 tablet (100 mg total) by mouth at bedtime. 11/25/13   Renae Fickle, MD  sertraline (ZOLOFT) 100 MG tablet Take 1 tablet (100 mg total) by mouth 2 (two) times daily. 11/25/13   Renae Fickle, MD  spironolactone (ALDACTONE)  50 MG tablet Take 1 tablet (50 mg total) by mouth daily. 11/25/13 11/25/14  Renae Fickle, MD   BP 165/93 mmHg  Pulse 108  Temp(Src) 98 F (36.7 C) (Oral)  Resp 22  Ht 6' (1.829 m)  Wt 272 lb 14.4 oz (123.787 kg)  BMI 37.00 kg/m2  SpO2 100% Physical Exam  Constitutional: He is oriented to person, place, and time. He appears well-developed and well-nourished. No distress.  HENT:  Head: Normocephalic and atraumatic.  Mouth/Throat: Oropharynx is clear and moist.  Eyes: EOM are normal. Pupils are equal, round, and reactive to light.  Neck: Normal range of motion. Neck supple.  Cardiovascular: Normal rate and regular rhythm.  Exam reveals no gallop and no friction rub.   No murmur heard. Pulmonary/Chest: Effort normal and breath sounds normal. No respiratory distress. He has no wheezes. He has no rales. He exhibits no tenderness.  Abdominal: Soft. Bowel sounds are normal. He exhibits no distension and no mass. There is no tenderness. There is no rebound and no guarding.  Genitourinary: Penis normal. No penile tenderness.  Uncircumcised penis. No lesions or discharge noted. No testicular tenderness. No masses appreciated  Musculoskeletal: Normal range of motion. He exhibits no edema or tenderness.  No calf swelling or tenderness.  Neurological: He is alert and oriented to person, place, and time.  Skin: Skin is warm and dry. No rash noted. No erythema.  1 cm round lesion to the undersurface of the right foot at the level of the  fifth metatarsal. There is mild serous discharge. There is no erythema or warmth. No purulent discharge. Base is shallow with no apparent bony involvement.  Psychiatric: He has a normal mood and affect. His behavior is normal.  Nursing note and vitals reviewed.   ED Course  Procedures (including critical care time) Labs Review Labs Reviewed - No data to display  Imaging Review No results found.   EKG Interpretation   Date/Time:  Saturday November 21 2014 23:37:54 EDT Ventricular Rate:  109 PR Interval:  168 QRS Duration: 114 QT Interval:  382 QTC Calculation: 514 R Axis:   -50 Text Interpretation:  Sinus tachycardia Possible Left atrial enlargement  Left axis deviation Left ventricular hypertrophy with repolarization  abnormality Cannot rule out Septal infarct , age undetermined Abnormal ECG  Sinus tachycardia Left axis deviation Left ventricular hypertrophy  Abnormal ekg Confirmed by Gerhard Munch  MD [phone]) on 11/21/2014  11:39:34 PM     Medications - No data to display  12:12 AM- Treatment plan was discussed with patient who verbalizes understanding and agrees.   MDM   Final diagnoses:  None    I personally performed the services described in this documentation, which was scribed in my presence. The recorded information has been reviewed and is accurate.  She presents with new onset diabetic foot ulcer to the plantar surface of the right foot. Prophylactic IV antibiotics given in the emergency department. No evidence of infection. Will start patient on oral antibiotics and he is advised to follow-up with his primary doctor or to establish care with the [facility] wellness clinic. She's been given return precautions and is voiced understanding. Patient also given IV dose of Lasix in the emergency department with some diuresis and improvement of his shortness of breath. He is maintaining adequate oxygenation on room air. Patient also given oral replacement of his potassium.  He is encouraged to eat potassium rich foods at home.   Loren Racer, MD 11/22/14 [phone]

## 2014-11-21 NOTE — ED Notes (Signed)
Pt is diabetic and he has a sore (appears as a oozing hole) on the bottom of his right foot, wife noticed stains in his socks, began hurting this pm (it was noticed yesterday).  Pt also reports increasing sob and coughing when laying down which he attributes to his CHF. The third thing pt reports is pain in penile area related to "cracked foreskin".

## 2014-11-22 ENCOUNTER — Emergency Department (HOSPITAL_BASED_OUTPATIENT_CLINIC_OR_DEPARTMENT_OTHER): Payer: No Typology Code available for payment source

## 2014-11-22 LAB — COMPREHENSIVE METABOLIC PANEL
ALT: 16 U/L (ref 0–53)
AST: 25 U/L (ref 0–37)
Albumin: 3.7 g/dL (ref 3.5–5.2)
Alkaline Phosphatase: 85 U/L (ref 39–117)
Anion gap: 9 (ref 5–15)
BUN: 10 mg/dL (ref 6–23)
CHLORIDE: 99 mmol/L (ref 96–112)
CO2: 28 mmol/L (ref 19–32)
CREATININE: 0.88 mg/dL (ref 0.50–1.35)
Calcium: 8.7 mg/dL (ref 8.4–10.5)
GFR calc Af Amer: >90 mL/min (ref >90)
GFR calc non Af Amer: >90 mL/min (ref >90)
GLUCOSE: 366 mg/dL — AB (ref 70–99)
Potassium: 3 mmol/L — ABNORMAL LOW (ref 3.5–5.1)
Sodium: 136 mmol/L (ref 135–145)
TOTAL PROTEIN: 7.4 g/dL (ref 6.0–8.3)
Total Bilirubin: 0.7 mg/dL (ref 0.3–1.2)

## 2014-11-22 LAB — CBC WITH DIFFERENTIAL/PLATELET
Basophils Absolute: 0 10*3/uL (ref 0.0–0.1)
Basophils Relative: 0 % (ref 0–1)
EOS ABS: 0.2 10*3/uL (ref 0.0–0.7)
EOS PCT: 2 % (ref 0–5)
HCT: 36.9 % — ABNORMAL LOW (ref 39.0–52.0)
Hemoglobin: 12.4 g/dL — ABNORMAL LOW (ref 13.0–17.0)
Lymphocytes Relative: 16 % (ref 12–46)
Lymphs Abs: 1.6 10*3/uL (ref 0.7–4.0)
MCH: 29.7 pg (ref 26.0–34.0)
MCHC: 33.6 g/dL (ref 30.0–36.0)
MCV: 88.3 fL (ref 78.0–100.0)
Monocytes Absolute: 0.5 10*3/uL (ref 0.1–1.0)
Monocytes Relative: 5 % (ref 3–12)
NEUTROS ABS: 7.7 10*3/uL (ref 1.7–7.7)
Neutrophils Relative %: 77 % (ref 43–77)
PLATELETS: 175 10*3/uL (ref 150–400)
RBC: 4.18 MIL/uL — ABNORMAL LOW (ref 4.22–5.81)
RDW: 12.2 % (ref 11.5–15.5)
WBC: 10 10*3/uL (ref 4.0–10.5)

## 2014-11-22 LAB — TROPONIN I: TROPONIN I: 0.03 ng/mL (ref <0.031)

## 2014-11-22 LAB — BRAIN NATRIURETIC PEPTIDE: B Natriuretic Peptide: 67.1 pg/mL (ref 0.0–100.0)

## 2014-11-22 MED ORDER — FUROSEMIDE 10 MG/ML IJ SOLN
40.0000 mg | Freq: Once | INTRAMUSCULAR | Status: AC
  Administered 2014-11-22: 40 mg via INTRAVENOUS
  Filled 2014-11-22: qty 4

## 2014-11-22 MED ORDER — INSULIN REGULAR HUMAN 100 UNIT/ML IJ SOLN
10.0000 [IU] | Freq: Once | INTRAMUSCULAR | Status: AC
  Administered 2014-11-22: 10 [IU] via SUBCUTANEOUS
  Filled 2014-11-22: qty 1

## 2014-11-22 MED ORDER — POTASSIUM CHLORIDE 20 MEQ/15ML (10%) PO SOLN
ORAL | Status: AC
  Administered 2014-11-22: 20 meq
  Filled 2014-11-22: qty 15

## 2014-11-22 MED ORDER — CLINDAMYCIN HCL 300 MG PO CAPS: 300.0000 mg | ORAL_CAPSULE | Freq: Four times a day (QID) | ORAL | Status: DC

## 2014-11-22 MED ORDER — VANCOMYCIN HCL IN DEXTROSE 1-5 GM/200ML-% IV SOLN
1000.0000 mg | Freq: Once | INTRAVENOUS | Status: AC
  Administered 2014-11-22: 1000 mg via INTRAVENOUS
  Filled 2014-11-22: qty 200

## 2014-11-22 MED ORDER — POTASSIUM CHLORIDE CRYS ER 20 MEQ PO TBCR
40.0000 meq | EXTENDED_RELEASE_TABLET | Freq: Once | ORAL | Status: DC
  Filled 2014-11-22: qty 2

## 2014-11-22 NOTE — ED Notes (Signed)
Wound care completed with bacitracin, 4x4's folded and kerlix wrap, secured with tape, post op shoe fit and given.

## 2014-11-22 NOTE — ED Notes (Signed)
Patient wanted post op shoe and wound care/dressing for sore on his bottom of right foot. Per nurse Chanin, approved.

## 2014-11-22 NOTE — ED Notes (Signed)
Gave pt liquid potassium per pt request as he refused the pills

## 2014-11-22 NOTE — Discharge Instructions (Signed)
Follow-up with your primary physician or the health [facility] wellness clinic early this week. Take in about X as prescribed. Return immediately for increased redness at the site of the ulcer, warmth or purulent discharge. Return immediately if you develop fever, increase shortness of breath or any concerns.   Diabetes and Foot Care Diabetes may cause you to have problems because of poor blood supply (circulation) to your feet and legs. This may cause the skin on your feet to become thinner, break easier, and heal more slowly. Your skin may become dry, and the skin may peel and crack. You may also have nerve damage in your legs and feet causing decreased feeling in them. You may not notice minor injuries to your feet that could lead to infections or more serious problems. Taking care of your feet is one of the most important things you can do for yourself.  HOME CARE INSTRUCTIONS  Wear shoes at all times, even in the house. Do not go barefoot. Bare feet are easily injured.  Check your feet daily for blisters, cuts, and redness. If you cannot see the bottom of your feet, use a mirror or ask someone for help.  Wash your feet with warm water (do not use hot water) and mild soap. Then pat your feet and the areas between your toes until they are completely dry. Do not soak your feet as this can dry your skin.  Apply a moisturizing lotion or petroleum jelly (that does not contain alcohol and is unscented) to the skin on your feet and to dry, brittle toenails. Do not apply lotion between your toes.  Trim your toenails straight across. Do not dig under them or around the cuticle. File the edges of your nails with an emery board or nail file.  Do not cut corns or calluses or try to remove them with medicine.  Wear clean socks or stockings every day. Make sure they are not too tight. Do not wear knee-high stockings since they may decrease blood flow to your legs.  Wear shoes that fit properly and have enough  cushioning. To break in new shoes, wear them for just a few hours a day. This prevents you from injuring your feet. Always look in your shoes before you put them on to be sure there are no objects inside.  Do not cross your legs. This may decrease the blood flow to your feet.  If you find a minor scrape, cut, or break in the skin on your feet, keep it and the skin around it clean and dry. These areas may be cleansed with mild soap and water. Do not cleanse the area with peroxide, alcohol, or iodine.  When you remove an adhesive bandage, be sure not to damage the skin around it.  If you have a wound, look at it several times a day to make sure it is healing.  Do not use heating pads or hot water bottles. They may burn your skin. If you have lost feeling in your feet or legs, you may not know it is happening until it is too late.  Make sure your health care provider performs a complete foot exam at least annually or more often if you have foot problems. Report any cuts, sores, or bruises to your health care provider immediately. SEEK MEDICAL CARE IF:   You have an injury that is not healing.  You have cuts or breaks in the skin.  You have an ingrown nail.  You notice redness on  your legs or feet.  You feel burning or tingling in your legs or feet.  You have pain or cramps in your legs and feet.  Your legs or feet are numb.  Your feet always feel cold. SEEK IMMEDIATE MEDICAL CARE IF:   There is increasing redness, swelling, or pain in or around a wound.  There is a red line that goes up your leg.  Pus is coming from a wound.  You develop a fever or as directed by your health care provider.  You notice a bad smell coming from an ulcer or wound. Document Released: 07/14/2000 Document Revised: 03/19/2013 Document Reviewed: 12/24/2012 [facility] Patient Information 2015 [place], [place]. This information is not intended to replace advice given to you by your health care provider. Make  sure you discuss any questions you have with your health care provider.  Hyperglycemia Hyperglycemia occurs when the glucose (sugar) in your blood is too high. Hyperglycemia can happen for many reasons, but it most often happens to people who do not know they have diabetes or are not managing their diabetes properly.  CAUSES  Whether you have diabetes or not, there are other causes of hyperglycemia. Hyperglycemia can occur when you have diabetes, but it can also occur in other situations that you might not be as aware of, such as: Diabetes  If you have diabetes and are having problems controlling your blood glucose, hyperglycemia could occur because of some of the following reasons:  Not following your meal plan.  Not taking your diabetes medications or not taking it properly.  Exercising less or doing less activity than you normally do.  Being sick. Pre-diabetes  This cannot be ignored. Before people develop Type 2 diabetes, they almost always have "pre-diabetes." This is when your blood glucose levels are higher than normal, but not yet high enough to be diagnosed as diabetes. Research has shown that some long-term damage to the body, especially the heart and circulatory system, may already be occurring during pre-diabetes. If you take action to manage your blood glucose when you have pre-diabetes, you may delay or prevent Type 2 diabetes from developing. Stress  If you have diabetes, you may be "diet" controlled or on oral medications or insulin to control your diabetes. However, you may find that your blood glucose is higher than usual in the hospital whether you have diabetes or not. This is often referred to as "stress hyperglycemia." Stress can elevate your blood glucose. This happens because of hormones put out by the body during times of stress. If stress has been the cause of your high blood glucose, it can be followed regularly by your caregiver. That way he/she can make sure your  hyperglycemia does not continue to get worse or progress to diabetes. Steroids  Steroids are medications that act on the infection fighting system (immune system) to block inflammation or infection. One side effect can be a rise in blood glucose. Most people can produce enough extra insulin to allow for this rise, but for those who cannot, steroids make blood glucose levels go even higher. It is not unusual for steroid treatments to "uncover" diabetes that is developing. It is not always possible to determine if the hyperglycemia will go away after the steroids are stopped. A special blood test called an A1c is sometimes done to determine if your blood glucose was elevated before the steroids were started. SYMPTOMS  Thirsty.  Frequent urination.  Dry mouth.  Blurred vision.  Tired or fatigue.  Weakness.  Sleepy.  Tingling in feet or leg. DIAGNOSIS  Diagnosis is made by monitoring blood glucose in one or all of the following ways:  A1c test. This is a chemical found in your blood.  Fingerstick blood glucose monitoring.  Laboratory results. TREATMENT  First, knowing the cause of the hyperglycemia is important before the hyperglycemia can be treated. Treatment may include, but is not be limited to:  Education.  Change or adjustment in medications.  Change or adjustment in meal plan.  Treatment for an illness, infection, etc.  More frequent blood glucose monitoring.  Change in exercise plan.  Decreasing or stopping steroids.  Lifestyle changes. HOME CARE INSTRUCTIONS   Test your blood glucose as directed.  Exercise regularly. Your caregiver will give you instructions about exercise. Pre-diabetes or diabetes which comes on with stress is helped by exercising.  Eat wholesome, balanced meals. Eat often and at regular, fixed times. Your caregiver or nutritionist will give you a meal plan to guide your sugar intake.  Being at an ideal weight is important. If needed,  losing as little as 10 to 15 pounds may help improve blood glucose levels. SEEK MEDICAL CARE IF:   You have questions about medicine, activity, or diet.  You continue to have symptoms (problems such as increased thirst, urination, or weight gain). SEEK IMMEDIATE MEDICAL CARE IF:   You are vomiting or have diarrhea.  Your breath smells fruity.  You are breathing faster or slower.  You are very sleepy or incoherent.  You have numbness, tingling, or pain in your feet or hands.  You have chest pain.  Your symptoms get worse even though you have been following your caregiver's orders.  If you have any other questions or concerns. Document Released: 01/10/2001 Document Revised: 10/09/2011 Document Reviewed: 11/13/2011 [facility] Patient Information 2015 [place], [place]. This information is not intended to replace advice given to you by your health care provider. Make sure you discuss any questions you have with your health care provider.  Hypokalemia Hypokalemia means that the amount of potassium in the blood is lower than normal.Potassium is a chemical, called an electrolyte, that helps regulate the amount of fluid in the body. It also stimulates muscle contraction and helps nerves function properly.Most of the body's potassium is inside of cells, and only a very small amount is in the blood. Because the amount in the blood is so small, minor changes can be life-threatening. CAUSES  Antibiotics.  Diarrhea or vomiting.  Using laxatives too much, which can cause diarrhea.  Chronic kidney disease.  Water pills (diuretics).  Eating disorders (bulimia).  Low magnesium level.  Sweating a lot. SIGNS AND SYMPTOMS  Weakness.  Constipation.  Fatigue.  Muscle cramps.  Mental confusion.  Skipped heartbeats or irregular heartbeat (palpitations).  Tingling or numbness. DIAGNOSIS  Your health care provider can diagnose hypokalemia with blood tests. In addition to checking  your potassium level, your health care provider may also check other lab tests. TREATMENT Hypokalemia can be treated with potassium supplements taken by mouth or adjustments in your current medicines. If your potassium level is very low, you may need to get potassium through a vein (IV) and be monitored in the hospital. A diet high in potassium is also helpful. Foods high in potassium are:  Nuts, such as peanuts and pistachios.  Seeds, such as sunflower seeds and pumpkin seeds.  Peas, lentils, and lima beans.  Whole grain and bran cereals and breads.  Fresh fruit and vegetables, such as apricots, avocado, bananas,  cantaloupe, kiwi, oranges, tomatoes, asparagus, and potatoes.  Orange and tomato juices.  Red meats.  Fruit yogurt. HOME CARE INSTRUCTIONS  Take all medicines as prescribed by your health care provider.  Maintain a healthy diet by including nutritious food, such as fruits, vegetables, nuts, whole grains, and lean meats.  If you are taking a laxative, be sure to follow the directions on the label. SEEK MEDICAL CARE IF:  Your weakness gets worse.  You feel your heart pounding or racing.  You are vomiting or having diarrhea.  You are diabetic and having trouble keeping your blood glucose in the normal range. SEEK IMMEDIATE MEDICAL CARE IF:  You have chest pain, shortness of breath, or dizziness.  You are vomiting or having diarrhea for more than 2 days.  You faint. MAKE SURE YOU:   Understand these instructions.  Will watch your condition.  Will get help right away if you are not doing well or get worse. Document Released: 07/17/2005 Document Revised: 05/07/2013 Document Reviewed: 01/17/2013 [facility] Patient Information 2015 [place], [place]. This information is not intended to replace advice given to you by your health care provider. Make sure you discuss any questions you have with your health care provider.  Pulmonary Edema Pulmonary edema is abnormal  fluid buildup in the lungs that can make it hard to breathe. HOME CARE  Talk to your doctor about an exercise program.  Eat a healthy diet:  Eat fresh fruits, vegetables, and lean meats.  Limit high fat and salty foods.  Avoid processed, canned, or fried foods.  Avoid fast food.  Follow your doctor's advice about taking medicine and recording the medicine you take.  Follow your doctor's advice about keeping a record of your weight.  Talk to your doctor about keeping track of your blood pressure.  Do not smoke.  Do not use nicotine patches or nicotine gum.  Make a follow-up appointment with your doctor.  Ask your doctor for a copy of your latest heart tracing (ECG) and keep a copy with you at all times. GET HELP RIGHT AWAY IF:   You have chest pain. THIS IS AN EMERGENCY. Do not wait to see if the pain will go away. Call for local emergency medical help. Do not drive yourself to the hospital.  You have sweating, feel sick to your stomach (nauseous), or are experiencing shortness of breath.  Your weight increases more than your doctor tells you it should.  You start to have shortness of breath.  You notice more swelling in your hands, feet, ankles, or belly.  You have dizziness, blurred vision, headache, or unsteadiness that does not go away.  You cough up bloody spit.  You have a cough that does not go away.  You are unable to sleep because it is hard to breathe.  You begin to feel a "jumping" or "fluttering" sensation (palpitations) in the chest that is unusual for you. MAKE SURE YOU:   Understand these instructions.  Will watch your condition.  Will get help right away if you are not doing well or get worse. Document Released: 07/05/2009 Document Revised: 07/22/2013 Document Reviewed: 03/24/2013 [facility] Patient Information 2015 [place], [place]. This information is not intended to replace advice given to you by your health care provider. Make sure you discuss  any questions you have with your health care provider.

## 2014-12-06 ENCOUNTER — Other Ambulatory Visit (HOSPITAL_COMMUNITY): Payer: Self-pay | Admitting: Adult Health

## 2014-12-08 ENCOUNTER — Emergency Department (HOSPITAL_BASED_OUTPATIENT_CLINIC_OR_DEPARTMENT_OTHER): Payer: No Typology Code available for payment source

## 2014-12-08 ENCOUNTER — Emergency Department (HOSPITAL_BASED_OUTPATIENT_CLINIC_OR_DEPARTMENT_OTHER)
Admission: EM | Admit: 2014-12-08 | Discharge: 2014-12-09 | Disposition: A | Discharge status: Home / Self Care | Payer: No Typology Code available for payment source | Attending: Emergency Medicine | Admitting: Emergency Medicine

## 2014-12-08 ENCOUNTER — Encounter (HOSPITAL_BASED_OUTPATIENT_CLINIC_OR_DEPARTMENT_OTHER): Payer: Self-pay

## 2014-12-08 DIAGNOSIS — Z9889 Other specified postprocedural states: Secondary | ICD-10-CM | POA: Diagnosis not present

## 2014-12-08 DIAGNOSIS — E1165 Type 2 diabetes mellitus with hyperglycemia: Secondary | ICD-10-CM | POA: Insufficient documentation

## 2014-12-08 DIAGNOSIS — I5022 Chronic systolic (congestive) heart failure: Secondary | ICD-10-CM | POA: Insufficient documentation

## 2014-12-08 DIAGNOSIS — Z9981 Dependence on supplemental oxygen: Secondary | ICD-10-CM | POA: Insufficient documentation

## 2014-12-08 DIAGNOSIS — R739 Hyperglycemia, unspecified: Secondary | ICD-10-CM

## 2014-12-08 DIAGNOSIS — E785 Hyperlipidemia, unspecified: Secondary | ICD-10-CM | POA: Diagnosis not present

## 2014-12-08 DIAGNOSIS — Z760 Encounter for issue of repeat prescription: Secondary | ICD-10-CM | POA: Diagnosis not present

## 2014-12-08 DIAGNOSIS — Z8673 Personal history of transient ischemic attack (TIA), and cerebral infarction without residual deficits: Secondary | ICD-10-CM | POA: Insufficient documentation

## 2014-12-08 DIAGNOSIS — Z8619 Personal history of other infectious and parasitic diseases: Secondary | ICD-10-CM | POA: Diagnosis not present

## 2014-12-08 DIAGNOSIS — R0602 Shortness of breath: Secondary | ICD-10-CM | POA: Insufficient documentation

## 2014-12-08 DIAGNOSIS — F319 Bipolar disorder, unspecified: Secondary | ICD-10-CM | POA: Insufficient documentation

## 2014-12-08 DIAGNOSIS — Z794 Long term (current) use of insulin: Secondary | ICD-10-CM | POA: Insufficient documentation

## 2014-12-08 DIAGNOSIS — G4733 Obstructive sleep apnea (adult) (pediatric): Secondary | ICD-10-CM | POA: Diagnosis not present

## 2014-12-08 DIAGNOSIS — E669 Obesity, unspecified: Secondary | ICD-10-CM | POA: Diagnosis not present

## 2014-12-08 DIAGNOSIS — Z86010 Personal history of colonic polyps: Secondary | ICD-10-CM | POA: Diagnosis not present

## 2014-12-08 DIAGNOSIS — Z8701 Personal history of pneumonia (recurrent): Secondary | ICD-10-CM | POA: Insufficient documentation

## 2014-12-08 DIAGNOSIS — Z79899 Other long term (current) drug therapy: Secondary | ICD-10-CM | POA: Insufficient documentation

## 2014-12-08 LAB — COMPREHENSIVE METABOLIC PANEL
ALT: 17 U/L (ref 17–63)
ANION GAP: 12 (ref 5–15)
AST: 23 U/L (ref 15–41)
Albumin: 3.8 g/dL (ref 3.5–5.0)
Alkaline Phosphatase: 102 U/L (ref 38–126)
BUN: 8 mg/dL (ref 6–20)
CO2: 25 mmol/L (ref 22–32)
CREATININE: 0.9 mg/dL (ref 0.61–1.24)
Calcium: 9.2 mg/dL (ref 8.9–10.3)
Chloride: 102 mmol/L (ref 101–111)
GFR calc Af Amer: >60 mL/min (ref >60)
GLUCOSE: 436 mg/dL — AB (ref 70–99)
Potassium: 3.8 mmol/L (ref 3.5–5.1)
Sodium: 139 mmol/L (ref 135–145)
Total Bilirubin: 0.3 mg/dL (ref 0.3–1.2)
Total Protein: 7.3 g/dL (ref 6.5–8.1)

## 2014-12-08 LAB — CBC WITH DIFFERENTIAL/PLATELET
Basophils Absolute: 0.1 10*3/uL (ref 0.0–0.1)
Basophils Relative: 1 % (ref 0–1)
Eosinophils Absolute: 0.1 10*3/uL (ref 0.0–0.7)
Eosinophils Relative: 1 % (ref 0–5)
HCT: 36.5 % — ABNORMAL LOW (ref 39.0–52.0)
HEMOGLOBIN: 12.4 g/dL — AB (ref 13.0–17.0)
LYMPHS PCT: 13 % (ref 12–46)
Lymphs Abs: 1.4 10*3/uL (ref 0.7–4.0)
MCH: 30.2 pg (ref 26.0–34.0)
MCHC: 34 g/dL (ref 30.0–36.0)
MCV: 88.8 fL (ref 78.0–100.0)
MONOS PCT: 5 % (ref 3–12)
Monocytes Absolute: 0.5 10*3/uL (ref 0.1–1.0)
Neutro Abs: 8.7 10*3/uL — ABNORMAL HIGH (ref 1.7–7.7)
Neutrophils Relative %: 80 % — ABNORMAL HIGH (ref 43–77)
Platelets: 160 10*3/uL (ref 150–400)
RBC: 4.11 MIL/uL — ABNORMAL LOW (ref 4.22–5.81)
RDW: 12.1 % (ref 11.5–15.5)
WBC: 10.8 10*3/uL — AB (ref 4.0–10.5)

## 2014-12-08 LAB — CBG MONITORING, ED: Glucose-Capillary: 408 mg/dL — ABNORMAL HIGH (ref 70–99)

## 2014-12-08 LAB — BRAIN NATRIURETIC PEPTIDE: B Natriuretic Peptide: 286 pg/mL — ABNORMAL HIGH (ref 0.0–100.0)

## 2014-12-08 MED ORDER — SODIUM CHLORIDE 0.9 % IV SOLN
INTRAVENOUS | Status: DC
  Administered 2014-12-08: via INTRAVENOUS

## 2014-12-08 MED ORDER — SODIUM CHLORIDE 0.9 % IV SOLN
INTRAVENOUS | Status: DC
  Administered 2014-12-08: 3.8 [IU]/h via INTRAVENOUS

## 2014-12-08 NOTE — ED Provider Notes (Addendum)
CSN: [id number]     Arrival date & time 12/08/14  2257 History  This chart was scribed for Paula Libra, MD by Abel Presto, ED Scribe. This patient was seen in room MH04/MH04 and the patient's care was started at 11:19 PM.    Chief Complaint  Patient presents with  . Shortness of Breath   The history is provided by the patient. No language interpreter was used.    HPI  HPI Comments: Mitchell [name] is a 44 y.o. male with PMHx of HTN, HLD, CVA, DM, CHF, MI and PVCs who presents to the Emergency Department complaining of moderate SOB with onset today. Pt notes associated cough. He reports increased dyspnea and coughing with lying supine. Pt states he no longer has refills for his medications, and has not taken Lasix, spironolactone, carvedilol and digoxin for 2 days and insulin for four days. He is unhappy with his current physician and is planning to change physicians.  Pt was last seen in ED on 11/21/14 for wound to bottom of right foot and cough with SOB. Pt states he was d/c with Rx for Abx. Pt was compliant with medication and has completed the course. Pt denies fever and urinary frequency.   Past Medical History  Diagnosis Date  . Hypertension   . Hyperlipidemia   . CVA (cerebral infarction)     No residual deficits  . Non-ischemic cardiomyopathy     No ischemia on myoview, showed EF of 42%. 2D echo showed EF 50-55% with diatolic dysfunction in 2009  . Post-cardiac injury syndrome     History of cardiac injury from blunt trauma  . PVCs (premature ventricular contractions)   . History of colonic polyps   . Obesity   . Syncope     Recurrent, thought to be vasovagal. Also has h/o frequent PVCs.   . Chronic systolic heart failure 11/2010    a. NICM 4/14 EF 20-25%, TR mild  . SOB (shortness of breath)   . 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 Headache(784.0)   . Diabetes mellitus     TYPE II; UNCONTROLLED BY HEMOGLOBIN A1c; STABLE AS  PER DISCHARGE  . CHF (congestive heart  failure)   . Myocardial infarction     at age 9 years old (while playing football)  . Stroke 2005    some left side weakness  . OSA (obstructive sleep apnea)     Being set up again for c-pap  . Pneumonia   . Depression     PTSD,   . Bipolar disorder   . Schizophrenia     Goes to [facility]   Past Surgical History  Procedure Laterality Date  . Cardiac catheterization  12/19/10    DIFFUSE NONOBSTRUCTIVE CAD; NONISCHEMIC CARDIOMYOPATHY; LEFT VENTRICULAR ANGIOGRAM WAS PERFORMED SECONDARY TO  ELEVATED LEFT VENTRICULAR FILLING PRESSURES  . Orif finger / thumb fracture Right   . Colonoscopy w/ polypectomy    . Multiple extractions with alveoloplasty  01/27/2014    "all my teeth; 4 Quadrants of alveoloplasty  . Multiple extractions with alveoloplasty N/A 01/27/2014    Procedure: EXTRACTION OF TEETH #'1, 2, 3, 4, 5, 6, 7, 8, 9, 10, 11, 12, 13, 14, 15, 16, 17, 20, 21, 22, 23, 24, 25, 26, 27, 28, 29, 31 and 32 WITH ALVEOLOPLASTY;  Surgeon: Charlynne Pander, DDS;  Location: [facility] OR;  Service: Oral Surgery;  Laterality: N/A;   Family History  Problem Relation Age of Onset  . Heart disease  Mother     MI  . Heart failure Mother   . Diabetes Mother     ALSO IN MOST OF HIS SIBLINGS; 2 UNLCES HAVE ALSO PASSED AWAY FROM DM  . Cardiomyopathy Mother   . Cancer - Ovarian Mother   . Heart disease Father   . Hypertension Father   . Diabetes Father    History  Substance Use Topics  . Smoking status: Never Smoker   . Smokeless tobacco: Never Used  . Alcohol Use: No    Review of Systems A complete 10 system review of systems was obtained and all systems are negative except as noted in the HPI and PMH.     Allergies  Nsaids  Home Medications   Prior to Admission medications   Medication Sig Start Date End Date Taking? Authorizing Provider  carvedilol (COREG) 25 MG tablet Take 1 tablet (25 mg total) by mouth 2 (two) times daily with a meal. 02/07/14   Elson Areas, PA-C   cyclobenzaprine (FLEXERIL) 10 MG tablet Take 1 tablet (10 mg total) by mouth 2 (two) times daily as needed for muscle spasms. 09/08/14   Nelva Nay, MD  digoxin (LANOXIN) 0.25 MG tablet Take 0.25 mg by mouth daily.    Historical Provider, MD  furosemide (LASIX) 40 MG tablet Take 1 tablet (40 mg total) by mouth 2 (two) times daily. 09/08/14   Nelva Nay, MD  HYDROcodone-acetaminophen (NORCO/VICODIN) 5-325 MG per tablet Take 1 tablet by mouth every 6 (six) hours as needed. 09/09/14   Rodolph Bong, MD  insulin NPH-regular Human (NOVOLIN 70/30) (70-30) 100 UNIT/ML injection Inject 40 Units into the skin 2 (two) times daily with a meal. 02/04/14   Np, NP  losartan (COZAAR) 100 MG tablet Take 50-100 mg by mouth 2 (two) times daily. Takes 100 mg in the morning and 50 mg in the evening    Historical Provider, MD  magnesium oxide (MAG-OX) 400 MG tablet Take 400 mg by mouth daily.    Historical Provider, MD  oxyCODONE-acetaminophen (PERCOCET) 5-325 MG per tablet Take 1 tablet by mouth every 4 (four) hours as needed. 09/17/14   Donnetta Hutching, MD  oxyCODONE-acetaminophen (PERCOCET) 5-325 MG per tablet Take 1 tablet by mouth every 6 (six) hours as needed. 10/05/14   Ladona Mow, PA-C  QUEtiapine (SEROQUEL) 100 MG tablet Take 1 tablet (100 mg total) by mouth at bedtime. 11/25/13   Renae Fickle, MD  sertraline (ZOLOFT) 100 MG tablet Take 1 tablet (100 mg total) by mouth 2 (two) times daily. 11/25/13   Renae Fickle, MD  spironolactone (ALDACTONE) 50 MG tablet Take 1 tablet (50 mg total) by mouth daily. 11/25/13 11/25/14  Renae Fickle, MD   BP 141/87 mmHg  Pulse 113  Temp(Src) 99.1 F (37.3 C) (Oral)  Resp 24  Ht 6' (1.829 m)  Wt 272 lb (123.378 kg)  BMI 36.88 kg/m2  SpO2 91%   Physical Exam General: Well-developed, well-nourished male in no acute distress; appearance consistent with age of record HENT: normocephalic; atraumatic Eyes: pupils equal, round and reactive to light; extraocular muscles  intact Neck: supple Heart: regular rate and rhythm; occasional ectopy; tachycardia Lungs: clear to auscultation bilaterally Abdomen: soft; nondistended; nontender; no masses or hepatosplenomegaly; bowel sounds present Extremities: No deformity; full range of motion; pulses normal Neurologic: Awake, alert and oriented; motor function intact in all extremities and symmetric; no facial droop Skin: Warm and dry; healing ulcer of the right lateral plantar skin proximal to the 4th toe with  no purulent drainage or erythema Psychiatric: Normal mood and affect   ED Course  Procedures (including critical care time) DIAGNOSTIC STUDIES: Oxygen Saturation is 97% on room air, normal by my interpretation.    COORDINATION OF CARE: 11:24 PM Discussed treatment plan with patient at beside, the patient agrees with the plan and has no further questions at this time.   EKG Interpretation   Date/Time:  Tuesday Dec 08 2014 23:03:39 EDT Ventricular Rate:  124 PR Interval:  148 QRS Duration: 102 QT Interval:  338 QTC Calculation: 485 R Axis:   -59 Text Interpretation:  Sinus tachycardia Left axis deviation Left  ventricular hypertrophy with repolarization abnormality Abnormal ECG No  significant change was found Confirmed by Read Drivers  MD, Jonny Ruiz ([id number]) on  12/08/2014 11:17:09 PM      MDM   Nursing notes and vitals signs, including pulse oximetry, reviewed.  Summary of this visit's results, reviewed by myself:  Labs:  Results for orders placed or performed during the hospital encounter of 12/08/14 (from the past 24 hour(s))  CBC with Differential     Status: Abnormal   Collection Time: 12/08/14 11:00 PM  Result Value Ref Range   WBC 10.8 (H) 4.0 - 10.5 K/uL   RBC 4.11 (L) 4.22 - 5.81 MIL/uL   Hemoglobin 12.4 (L) 13.0 - 17.0 g/dL   HCT 60.4 (L) 54.0 - 98.1 %   MCV 88.8 78.0 - 100.0 fL   MCH 30.2 26.0 - 34.0 pg   MCHC 34.0 30.0 - 36.0 g/dL   RDW 19.1 47.8 - 29.5 %   Platelets 160 150 - 400  K/uL   Neutrophils Relative % 80 (H) 43 - 77 %   Lymphocytes Relative 13 12 - 46 %   Monocytes Relative 5 3 - 12 %   Eosinophils Relative 1 0 - 5 %   Basophils Relative 1 0 - 1 %   Neutro Abs 8.7 (H) 1.7 - 7.7 K/uL   Lymphs Abs 1.4 0.7 - 4.0 K/uL   Monocytes Absolute 0.5 0.1 - 1.0 K/uL   Eosinophils Absolute 0.1 0.0 - 0.7 K/uL   Basophils Absolute 0.1 0.0 - 0.1 K/uL   Smear Review LARGE PLATELETS PRESENT   Comprehensive metabolic panel     Status: Abnormal   Collection Time: 12/08/14 11:00 PM  Result Value Ref Range   Sodium 139 135 - 145 mmol/L   Potassium 3.8 3.5 - 5.1 mmol/L   Chloride 102 101 - 111 mmol/L   CO2 25 22 - 32 mmol/L   Glucose, Bld 436 (H) 70 - 99 mg/dL   BUN 8 6 - 20 mg/dL   Creatinine, Ser 6.21 0.61 - 1.24 mg/dL   Calcium 9.2 8.9 - 30.8 mg/dL   Total Protein 7.3 6.5 - 8.1 g/dL   Albumin 3.8 3.5 - 5.0 g/dL   AST 23 15 - 41 U/L   ALT 17 17 - 63 U/L   Alkaline Phosphatase 102 38 - 126 U/L   Total Bilirubin 0.3 0.3 - 1.2 mg/dL   GFR calc non Af Amer >60 >60 mL/min   GFR calc Af Amer >60 >60 mL/min   Anion gap 12 5 - 15  Brain natriuretic peptide     Status: Abnormal   Collection Time: 12/08/14 11:00 PM  Result Value Ref Range   B Natriuretic Peptide 286.0 (H) 0.0 - 100.0 pg/mL  POC CBG, ED     Status: Abnormal   Collection Time: 12/08/14 11:10 PM  Result Value  Ref Range   Glucose-Capillary 408 (H) 70 - 99 mg/dL  Urinalysis, Routine w reflex microscopic     Status: Abnormal   Collection Time: 12/08/14 11:55 PM  Result Value Ref Range   Color, Urine YELLOW YELLOW   APPearance CLOUDY (A) CLEAR   Specific Gravity, Urine 1.040 (H) 1.005 - 1.030   pH 5.0 5.0 - 8.0   Glucose, UA >1000 (A) NEGATIVE mg/dL   Hgb urine dipstick TRACE (A) NEGATIVE   Bilirubin Urine NEGATIVE NEGATIVE   Ketones, ur NEGATIVE NEGATIVE mg/dL   Protein, ur NEGATIVE NEGATIVE mg/dL   Urobilinogen, UA 0.2 0.0 - 1.0 mg/dL   Nitrite POSITIVE (A) NEGATIVE   Leukocytes, UA TRACE (A)  NEGATIVE  Urine microscopic-add on     Status: Abnormal   Collection Time: 12/08/14 11:55 PM  Result Value Ref Range   Squamous Epithelial / LPF FEW (A) RARE   WBC, UA 11-20 <3 WBC/hpf   RBC / HPF 3-6 <3 RBC/hpf   Bacteria, UA MANY (A) RARE   Urine-Other FEW YEAST   CBG monitoring, ED     Status: Abnormal   Collection Time: 12/09/14  1:19 AM  Result Value Ref Range   Glucose-Capillary 339 (H) 70 - 99 mg/dL  CBG monitoring, ED     Status: Abnormal   Collection Time: 12/09/14  2:24 AM  Result Value Ref Range   Glucose-Capillary 262 (H) 70 - 99 mg/dL    Imaging Studies: Dg Chest 2 View  12/08/2014   CLINICAL DATA:  Shortness of breath for 4 days. Feels like lungs are filling with fluid. History of CHF.  EXAM: CHEST  2 VIEW  COMPARISON:  11/22/2014  FINDINGS: Cardiac enlargement with mild vascular congestion. No edema or consolidation. No blunting of costophrenic angles. No pneumothorax. Mediastinal contours appear intact.  IMPRESSION: Mild cardiac enlargement with mild vascular congestion. No edema or effusion.   Electronically Signed   By: Burman Nieves M.D.   On: 12/08/2014 23:57   1:24 AM Patient on insulin drip per glucose stabilizer. Urine sent for culture. Patient given Diflucan 150 milligrams for urinary yeast infection. Will not start antibiotics at this time, because patient is asymptomatic, pending culture results.  I personally performed the services described in this documentation, which was scribed in my presence. The recorded information has been reviewed and is accurate.  2:48 AM Patient states he needs to leave so his daughter can go home. We will give him some subcutaneous insulin before discharge. We will provide refills for his essential medications pending follow-up.  Paula Libra, MD 12/09/14 [id number]  Paula Libra, MD 12/09/14 [phone]

## 2014-12-08 NOTE — ED Notes (Signed)
Pt reports he is out of his lasix, spironolactone, carvedilol and digoxin x 2 days. Reports somewhat productive cough today. Unable to lay back.

## 2014-12-08 NOTE — ED Notes (Signed)
MD at bedside. 

## 2014-12-09 LAB — URINE MICROSCOPIC-ADD ON

## 2014-12-09 LAB — URINALYSIS, ROUTINE W REFLEX MICROSCOPIC
Bilirubin Urine: NEGATIVE
KETONES UR: NEGATIVE mg/dL
Nitrite: POSITIVE — AB
Protein, ur: NEGATIVE mg/dL
SPECIFIC GRAVITY, URINE: 1.04 — AB (ref 1.005–1.030)
Urobilinogen, UA: 0.2 mg/dL (ref 0.0–1.0)
pH: 5 (ref 5.0–8.0)

## 2014-12-09 LAB — CBG MONITORING, ED
GLUCOSE-CAPILLARY: 339 mg/dL — AB (ref 70–99)
GLUCOSE-CAPILLARY: 262 mg/dL — AB (ref 70–99)

## 2014-12-09 MED ORDER — INSULIN REGULAR HUMAN 100 UNIT/ML IJ SOLN
5.0000 [IU] | Freq: Once | INTRAMUSCULAR | Status: AC
  Administered 2014-12-09: 5 [IU] via SUBCUTANEOUS
  Filled 2014-12-09: qty 1

## 2014-12-09 MED ORDER — FLUCONAZOLE 50 MG PO TABS
150.0000 mg | ORAL_TABLET | Freq: Once | ORAL | Status: AC
  Administered 2014-12-09: 150 mg via ORAL
  Filled 2014-12-09 (×2): qty 1

## 2014-12-09 MED ORDER — DIGOXIN 250 MCG PO TABS: 0.2500 mg | ORAL_TABLET | Freq: Every day | ORAL | Status: DC

## 2014-12-09 MED ORDER — CARVEDILOL 25 MG PO TABS
25.0000 mg | ORAL_TABLET | Freq: Two times a day (BID) | ORAL | Status: DC
Start: 2014-12-09 — End: 2016-06-13

## 2014-12-09 MED ORDER — SPIRONOLACTONE 50 MG PO TABS: 50.0000 mg | ORAL_TABLET | Freq: Every day | ORAL | Status: DC

## 2014-12-09 MED ORDER — FUROSEMIDE 40 MG PO TABS: 40.0000 mg | ORAL_TABLET | Freq: Two times a day (BID) | ORAL | Status: DC

## 2014-12-09 MED ORDER — INSULIN NPH ISOPHANE & REGULAR (70-30) 100 UNIT/ML ~~LOC~~ SUSP: 40.0000 [IU] | Freq: Two times a day (BID) | SUBCUTANEOUS | Status: DC

## 2014-12-09 NOTE — ED Notes (Signed)
IV removed, CDI Dressing to right foot placed per patients preference.

## 2014-12-11 LAB — URINE CULTURE: Colony Count: >=100000

## 2014-12-12 NOTE — Progress Notes (Signed)
ED Antimicrobial Stewardship Positive Culture Follow Up   Mitchell [name] is an 44 y.o. male who presented to [facility] on 12/08/2014 with a chief complaint of  Chief Complaint  Patient presents with  . Shortness of Breath    Recent Results (from the past 720 hour(s))  Urine culture     Status: None   Collection Time: 12/08/14 11:55 PM  Result Value Ref Range Status   Specimen Description URINE, CLEAN CATCH  Final   Special Requests NONE  Final   Colony Count   Final    >=100,000 COLONIES/ML Performed at [facility]    Culture   Final    ESCHERICHIA COLI Performed at [facility]    Report Status 12/11/2014 FINAL  Final   Organism ID, Bacteria ESCHERICHIA COLI  Final      Susceptibility   Escherichia coli - MIC*    AMPICILLIN <=2 SENSITIVE Sensitive     CEFAZOLIN <=4 SENSITIVE Sensitive     CEFTRIAXONE <=1 SENSITIVE Sensitive     CIPROFLOXACIN <=0.25 SENSITIVE Sensitive     GENTAMICIN <=1 SENSITIVE Sensitive     LEVOFLOXACIN <=0.12 SENSITIVE Sensitive     NITROFURANTOIN <=16 SENSITIVE Sensitive     TOBRAMYCIN <=1 SENSITIVE Sensitive     TRIMETH/SULFA <=20 SENSITIVE Sensitive     PIP/TAZO <=4 SENSITIVE Sensitive     * ESCHERICHIA COLI    [x]  Patient discharged originally without antimicrobial agent and treatment is now indicated  New antibiotic prescription: Keflex 500 mg bid x 7 days  ED Provider: Junius Finner PA-C  Rolley Sims 12/12/2014, 11:57 AM Infectious Diseases Pharmacist Phone# [phone]

## 2014-12-13 ENCOUNTER — Telehealth: Payer: Self-pay | Admitting: Emergency Medicine

## 2014-12-13 NOTE — Telephone Encounter (Signed)
Post ED Visit - Positive Culture Follow-up: Successful Patient Follow-Up  Culture assessed and recommendations reviewed by: []  Celedonio Miyamoto, Pharm.D., BCPS-AQ ID [x]  Georgina Pillion, [address].D., BCPS []  [place], [address].D., BCPS, AAHIVP []  Estella Husk, Pharm.D., BCPS, AAHIVP []  Tegan Magsam, Pharm.D. []  Tennis Must, Pharm.D.  Positive urine culture  [x]  Patient discharged without antimicrobial prescription and treatment is now indicated []  Organism is resistant to prescribed ED discharge antimicrobial []  Patient with positive blood cultures  Changes discussed with ED provider: Junius Finner, PA New antibiotic prescription: Keflex 500 mg PO BID x seven days Called to [facility] [phone]  Contacted patient, date 12/13/14, time 0511   Jiles Harold 12/13/2014, 8:38 AM

## 2015-01-16 ENCOUNTER — Emergency Department (HOSPITAL_COMMUNITY): Payer: No Typology Code available for payment source

## 2015-01-16 ENCOUNTER — Encounter (HOSPITAL_COMMUNITY): Payer: Self-pay | Admitting: Emergency Medicine

## 2015-01-16 ENCOUNTER — Emergency Department (HOSPITAL_COMMUNITY)
Admission: EM | Admit: 2015-01-16 | Discharge: 2015-01-16 | Disposition: A | Discharge status: Home / Self Care | Payer: No Typology Code available for payment source | Attending: Emergency Medicine | Admitting: Emergency Medicine

## 2015-01-16 DIAGNOSIS — Z9889 Other specified postprocedural states: Secondary | ICD-10-CM | POA: Diagnosis not present

## 2015-01-16 DIAGNOSIS — Z8701 Personal history of pneumonia (recurrent): Secondary | ICD-10-CM | POA: Diagnosis not present

## 2015-01-16 DIAGNOSIS — Z794 Long term (current) use of insulin: Secondary | ICD-10-CM | POA: Insufficient documentation

## 2015-01-16 DIAGNOSIS — E11621 Type 2 diabetes mellitus with foot ulcer: Secondary | ICD-10-CM | POA: Insufficient documentation

## 2015-01-16 DIAGNOSIS — Z8619 Personal history of other infectious and parasitic diseases: Secondary | ICD-10-CM | POA: Diagnosis not present

## 2015-01-16 DIAGNOSIS — Z79899 Other long term (current) drug therapy: Secondary | ICD-10-CM | POA: Diagnosis not present

## 2015-01-16 DIAGNOSIS — F319 Bipolar disorder, unspecified: Secondary | ICD-10-CM | POA: Insufficient documentation

## 2015-01-16 DIAGNOSIS — L97419 Non-pressure chronic ulcer of right heel and midfoot with unspecified severity: Secondary | ICD-10-CM | POA: Insufficient documentation

## 2015-01-16 DIAGNOSIS — E669 Obesity, unspecified: Secondary | ICD-10-CM | POA: Insufficient documentation

## 2015-01-16 DIAGNOSIS — I5022 Chronic systolic (congestive) heart failure: Secondary | ICD-10-CM | POA: Diagnosis not present

## 2015-01-16 DIAGNOSIS — L97519 Non-pressure chronic ulcer of other part of right foot with unspecified severity: Secondary | ICD-10-CM

## 2015-01-16 DIAGNOSIS — Z86010 Personal history of colonic polyps: Secondary | ICD-10-CM | POA: Insufficient documentation

## 2015-01-16 DIAGNOSIS — I252 Old myocardial infarction: Secondary | ICD-10-CM | POA: Diagnosis not present

## 2015-01-16 DIAGNOSIS — F209 Schizophrenia, unspecified: Secondary | ICD-10-CM | POA: Diagnosis not present

## 2015-01-16 DIAGNOSIS — I10 Essential (primary) hypertension: Secondary | ICD-10-CM | POA: Diagnosis not present

## 2015-01-16 DIAGNOSIS — E13621 Other specified diabetes mellitus with foot ulcer: Secondary | ICD-10-CM

## 2015-01-16 DIAGNOSIS — Z8673 Personal history of transient ischemic attack (TIA), and cerebral infarction without residual deficits: Secondary | ICD-10-CM | POA: Insufficient documentation

## 2015-01-16 DIAGNOSIS — L97509 Non-pressure chronic ulcer of other part of unspecified foot with unspecified severity: Secondary | ICD-10-CM

## 2015-01-16 DIAGNOSIS — M79671 Pain in right foot: Secondary | ICD-10-CM | POA: Diagnosis present

## 2015-01-16 LAB — CBC WITH DIFFERENTIAL/PLATELET
Basophils Absolute: 0 10*3/uL (ref 0.0–0.1)
Basophils Relative: 0 % (ref 0–1)
EOS ABS: 0.1 10*3/uL (ref 0.0–0.7)
Eosinophils Relative: 1 % (ref 0–5)
HCT: 37 % — ABNORMAL LOW (ref 39.0–52.0)
Hemoglobin: 12.5 g/dL — ABNORMAL LOW (ref 13.0–17.0)
LYMPHS PCT: 17 % (ref 12–46)
Lymphs Abs: 1.7 10*3/uL (ref 0.7–4.0)
MCH: 29 pg (ref 26.0–34.0)
MCHC: 33.8 g/dL (ref 30.0–36.0)
MCV: 85.8 fL (ref 78.0–100.0)
MONO ABS: 0.6 10*3/uL (ref 0.1–1.0)
MONOS PCT: 7 % (ref 3–12)
NEUTROS ABS: 7.1 10*3/uL (ref 1.7–7.7)
Neutrophils Relative %: 75 % (ref 43–77)
Platelets: 222 10*3/uL (ref 150–400)
RBC: 4.31 MIL/uL (ref 4.22–5.81)
RDW: 12.5 % (ref 11.5–15.5)
WBC: 9.5 10*3/uL (ref 4.0–10.5)

## 2015-01-16 LAB — BASIC METABOLIC PANEL
Anion gap: 8 (ref 5–15)
BUN: 19 mg/dL (ref 6–20)
CALCIUM: 8.4 mg/dL — AB (ref 8.9–10.3)
CO2: 29 mmol/L (ref 22–32)
Chloride: 99 mmol/L — ABNORMAL LOW (ref 101–111)
Creatinine, Ser: 1.36 mg/dL — ABNORMAL HIGH (ref 0.61–1.24)
GFR calc Af Amer: >60 mL/min (ref >60)
Glucose, Bld: 274 mg/dL — ABNORMAL HIGH (ref 65–99)
Potassium: 3.4 mmol/L — ABNORMAL LOW (ref 3.5–5.1)
Sodium: 136 mmol/L (ref 135–145)

## 2015-01-16 MED ORDER — BACITRACIN 500 UNIT/GM EX OINT
1.0000 | TOPICAL_OINTMENT | Freq: Two times a day (BID) | CUTANEOUS | Status: DC
Start: 2015-01-16 — End: 2015-01-16
  Filled 2015-01-16: qty 0.9
  Filled 2015-01-16: qty 2.7

## 2015-01-16 MED ORDER — CLINDAMYCIN HCL 150 MG PO CAPS: 450.0000 mg | ORAL_CAPSULE | Freq: Three times a day (TID) | ORAL | Status: DC

## 2015-01-16 MED ORDER — HYDROCODONE-ACETAMINOPHEN 5-325 MG PO TABS: 1.0000 | ORAL_TABLET | Freq: Four times a day (QID) | ORAL | Status: DC | PRN

## 2015-01-16 MED ORDER — HYDROCODONE-ACETAMINOPHEN 5-325 MG PO TABS
2.0000 | ORAL_TABLET | Freq: Once | ORAL | Status: AC
  Administered 2015-01-16: 2 via ORAL
  Filled 2015-01-16: qty 2

## 2015-01-16 NOTE — ED Notes (Signed)
Per EMS: Pt has a diabetic ulcer below his rt pinky toe.  Pt has had this for about a month.  Has been doing peroxide, neosporin to this area but it's getting worse.

## 2015-01-16 NOTE — Discharge Instructions (Signed)
Skin Ulcer  A skin ulcer is an open sore that can be shallow or deep. Skin ulcers sometimes become infected and are difficult to treat. It may be 1 month or longer before real healing progress is made.  CAUSES    Injury.   Problems with the veins or arteries.   Diabetes.   Insect bites.   Bedsores.   Inflammatory conditions.  SYMPTOMS    Pain, redness, swelling, and tenderness around the ulcer.   Fever.   Bleeding from the ulcer.   Yellow or clear fluid coming from the ulcer.  DIAGNOSIS   There are many types of skin ulcers. Any open sores will be examined. Certain tests will be done to determine the kind of ulcer you have. The right treatment depends on the type of ulcer you have.  TREATMENT   Treatment is a long-term challenge. It may include:   Wearing an elastic wrap, compression stockings, or gel cast over the ulcer area.   Taking antibiotic medicines or putting antibiotic creams on the affected area if there is an infection.  HOME CARE INSTRUCTIONS   Put on your bandages (dressings), wraps, or casts over the ulcer as directed by your caregiver.   Change all dressings as directed by your caregiver.   Take all medicines as directed by your caregiver.   Keep the affected area clean and dry.   Avoid injuries to the affected area.   Eat a well-balanced, healthy diet that includes plenty of fruit and vegetables.   If you smoke, consider quitting or decreasing the amount of cigarettes you smoke.   Once the ulcer heals, get regular exercise as directed by your caregiver.   Work with your caregiver to make sure your blood pressure, cholesterol, and diabetes are well-controlled.   Keep your skin moisturized. Dry skin can crack and lead to skin ulcers.  SEEK IMMEDIATE MEDICAL CARE IF:    Your pain gets worse.   You have swelling, redness, or fluids around the ulcer.   You have chills.   You have a fever.  MAKE SURE YOU:    Understand these instructions.   Will watch your condition.   Will get  help right away if you are not doing well or get worse.  Document Released: 08/24/2004 Document Revised: 10/09/2011 Document Reviewed: 03/03/2011  [employer] Patient Information 2015 [employer]. This information is not intended to replace advice given to you by your health care provider. Make sure you discuss any questions you have with your health care provider.

## 2015-01-16 NOTE — ED Provider Notes (Signed)
CSN: [id number]     Arrival date & time 01/16/15  1719 History   First MD Initiated Contact with Patient 01/16/15 1729     Chief Complaint  Patient presents with  . Foot Pain     (Consider location/radiation/quality/duration/timing/severity/associated sxs/prior Treatment) HPI Patient is a 44 year old male past medical history diabetes, hypertension, hyperlipidemia, CHF, who presents the ER complaining of an ulcer on his right foot. Patient states he is noticing callus on the foot for the past month, 1 week ago noticed pain in the area and an associated ulceration. Patient states the ulcer itself is not painful, however he is expressing pain in that region. Patient denies any purulent discharge or drainage, erythema or swelling of his foot. Patient has any nausea, vomiting, fever.  Past Medical History  Diagnosis Date  . Hypertension   . Hyperlipidemia   . CVA (cerebral infarction)     No residual deficits  . Non-ischemic cardiomyopathy     No ischemia on myoview, showed EF of 42%. 2D echo showed EF 50-55% with diatolic dysfunction in 2009  . Post-cardiac injury syndrome     History of cardiac injury from blunt trauma  . PVCs (premature ventricular contractions)   . History of colonic polyps   . Obesity   . Syncope     Recurrent, thought to be vasovagal. Also has h/o frequent PVCs.   . Chronic systolic heart failure 11/2010    a. NICM 4/14 EF 20-25%, TR mild  . SOB (shortness of breath)   . 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 Headache(784.0)   . Diabetes mellitus     TYPE II; UNCONTROLLED BY HEMOGLOBIN A1c; STABLE AS  PER DISCHARGE  . CHF (congestive heart failure)   . Myocardial infarction     at age 25 years old (while playing football)  . Stroke 2005    some left side weakness  . OSA (obstructive sleep apnea)     Being set up again for c-pap  . Pneumonia   . Depression     PTSD,   . Bipolar disorder   . Schizophrenia     Goes to [facility]   Past  Surgical History  Procedure Laterality Date  . Cardiac catheterization  12/19/10    DIFFUSE NONOBSTRUCTIVE CAD; NONISCHEMIC CARDIOMYOPATHY; LEFT VENTRICULAR ANGIOGRAM WAS PERFORMED SECONDARY TO  ELEVATED LEFT VENTRICULAR FILLING PRESSURES  . Orif finger / thumb fracture Right   . Colonoscopy w/ polypectomy    . Multiple extractions with alveoloplasty  01/27/2014    "all my teeth; 4 Quadrants of alveoloplasty  . Multiple extractions with alveoloplasty N/A 01/27/2014    Procedure: EXTRACTION OF TEETH #'1, 2, 3, 4, 5, 6, 7, 8, 9, 10, 11, 12, 13, 14, 15, 16, 17, 20, 21, 22, 23, 24, 25, 26, 27, 28, 29, 31 and 32 WITH ALVEOLOPLASTY;  Surgeon: Charlynne Pander, DDS;  Location: [facility] OR;  Service: Oral Surgery;  Laterality: N/A;   Family History  Problem Relation Age of Onset  . Heart disease Mother     MI  . Heart failure Mother   . Diabetes Mother     ALSO IN MOST OF HIS SIBLINGS; 2 UNLCES HAVE ALSO PASSED AWAY FROM DM  . Cardiomyopathy Mother   . Cancer - Ovarian Mother   . Heart disease Father   . Hypertension Father   . Diabetes Father    History  Substance Use Topics  . Smoking status: Never Smoker   . Smokeless  tobacco: Never Used  . Alcohol Use: No    Review of Systems  Constitutional: Negative for fever.  HENT: Negative for trouble swallowing.   Eyes: Negative for visual disturbance.  Respiratory: Negative for shortness of breath.   Cardiovascular: Negative for chest pain.  Gastrointestinal: Negative for nausea, vomiting and abdominal pain.  Genitourinary: Negative for dysuria.  Musculoskeletal: Negative for neck pain.  Skin: Positive for wound. Negative for rash.  Neurological: Negative for dizziness, weakness and numbness.  Psychiatric/Behavioral: Negative.       Allergies  Nsaids  Home Medications   Prior to Admission medications   Medication Sig Start Date End Date Taking? Authorizing Provider  amLODipine (NORVASC) 5 MG tablet Take 5 mg by mouth daily.   Yes  Historical Provider, MD  carvedilol (COREG) 25 MG tablet Take 1 tablet (25 mg total) by mouth 2 (two) times daily with a meal. 12/09/14  Yes John Molpus, MD  digoxin (LANOXIN) 0.25 MG tablet Take 1 tablet (0.25 mg total) by mouth daily. 12/09/14  Yes John Molpus, MD  furosemide (LASIX) 40 MG tablet Take 1 tablet (40 mg total) by mouth 2 (two) times daily. 12/09/14  Yes John Molpus, MD  insulin NPH-regular Human (NOVOLIN 70/30) (70-30) 100 UNIT/ML injection Inject 40 Units into the skin 2 (two) times daily with a meal. 12/09/14  Yes John Molpus, MD  naproxen sodium (ANAPROX) 220 MG tablet Take 440 mg by mouth 2 (two) times daily as needed (pain).   Yes Historical Provider, MD  QUEtiapine (SEROQUEL) 100 MG tablet Take 1 tablet (100 mg total) by mouth at bedtime. Patient taking differently: Take 150 mg by mouth at bedtime.  11/25/13  Yes Renae Fickle, MD  sertraline (ZOLOFT) 100 MG tablet Take 1 tablet (100 mg total) by mouth 2 (two) times daily. Patient taking differently: Take 100 mg by mouth 2 (two) times daily at 8 am and 10 pm.  11/25/13  Yes Renae Fickle, MD  spironolactone (ALDACTONE) 50 MG tablet Take 1 tablet (50 mg total) by mouth daily. 12/09/14 12/09/15 Yes John Molpus, MD  clindamycin (CLEOCIN) 150 MG capsule Take 3 capsules (450 mg total) by mouth 3 (three) times daily. 01/16/15   Ladona Mow, PA-C  cyclobenzaprine (FLEXERIL) 10 MG tablet Take 1 tablet (10 mg total) by mouth 2 (two) times daily as needed for muscle spasms. Patient not taking: Reported on 01/16/2015 09/08/14   Nelva Nay, MD  HYDROcodone-acetaminophen (NORCO/VICODIN) 5-325 MG per tablet Take 1-2 tablets by mouth every 6 (six) hours as needed. 01/16/15   Ladona Mow, PA-C   BP 126/87 mmHg  Pulse 81  Temp(Src) 97.6 F (36.4 C) (Oral)  Resp 20  SpO2 98% Physical Exam  Constitutional: He is oriented to person, place, and time. He appears well-developed and well-nourished. No distress.  HENT:  Head: Normocephalic and  atraumatic.  Mouth/Throat: Oropharynx is clear and moist. No oropharyngeal exudate.  Eyes: Right eye exhibits no discharge. Left eye exhibits no discharge. No scleral icterus.  Neck: Normal range of motion.  Cardiovascular: Normal rate, regular rhythm and normal heart sounds.   No murmur heard. Pulmonary/Chest: Effort normal and breath sounds normal. No respiratory distress.  Abdominal: Soft. There is no tenderness.  Musculoskeletal: Normal range of motion. He exhibits no edema or tenderness.  Neurological: He is alert and oriented to person, place, and time. No cranial nerve deficit. Coordination normal.  Skin: Skin is warm and dry. No rash noted. He is not diaphoretic.  Patient has a 1 x  1 cm ulceration to the plantar aspect of his distal metatarsals on his right foot. Ulceration goes deep to the fascia. The bottom of wound is easily visualized, there is no necrotic tissue. Bottom of wound is red, bloody.  Psychiatric: He has a normal mood and affect.  Nursing note and vitals reviewed.   ED Course  Procedures (including critical care time) Labs Review Labs Reviewed  CBC WITH DIFFERENTIAL/PLATELET - Abnormal; Notable for the following:    Hemoglobin 12.5 (*)    HCT 37.0 (*)    All other components within normal limits  BASIC METABOLIC PANEL - Abnormal; Notable for the following:    Potassium 3.4 (*)    Chloride 99 (*)    Glucose, Bld 274 (*)    Creatinine, Ser 1.36 (*)    Calcium 8.4 (*)    All other components within normal limits    Imaging Review Dg Foot Complete Right  01/16/2015   CLINICAL DATA:  Pain in region of ulcer at volar fifth MTP joint level. Diabetes mellitus.  EXAM: RIGHT FOOT COMPLETE - 3+ VIEW  COMPARISON:  November 22, 2014  FINDINGS: Frontal, oblique, and lateral views were obtained. There is an air-containing soft tissue defect slightly lateral and volar to the fifth MTP P joint. This defect is larger than on prior study. There is no erosive change or bony  destruction. No fracture or dislocation. There is no appreciable joint space narrowing. There is spurring in the dorsal midfoot. There are spurs arising from the inferior and posterior calcaneus.  IMPRESSION: Area containing soft tissue defect slightly lateral and volar to the fifth MTP joint, larger than on prior study. Suspect abscess in the soft tissues in this area. No erosive change or bony destruction. No fracture or dislocation. Spurring in the dorsal midfoot as well as arising from the posterior and inferior calcaneus present.   Electronically Signed   By: Bretta Bang III M.D.   On: 01/16/2015 18:39     EKG Interpretation None           MDM   Final diagnoses:  Diabetic foot ulcer    Patient with diabetic foot ulcer. Tissue does not appear to be devitalized or necrotic. There is no surrounding cellulitis, appears to be poorly healing wound. No concern for necrotizing fasciitis. There are no systemic signs or symptoms, no concern for sepsis or SIRS. Patient is afebrile, hemodynamically stable and in no acute distress. Wound was properly dressed here, patient strongly encouraged to follow-up with wound clinic. Patient placed on clindamycin. Return precautions discussed, patient verbalizes understanding and agreement of this plan.  BP 126/87 mmHg  Pulse 81  Temp(Src) 97.6 F (36.4 C) (Oral)  Resp 20  SpO2 98%  Signed,  Ladona Mow, PA-C 1:09 AM  Patient discussed with Dr. Linwood Dibbles, MD    Ladona Mow, PA-C 01/17/15 0109  Linwood Dibbles, MD 01/18/15 1218

## 2015-01-22 ENCOUNTER — Encounter (HOSPITAL_BASED_OUTPATIENT_CLINIC_OR_DEPARTMENT_OTHER): Payer: No Typology Code available for payment source | Attending: Internal Medicine

## 2015-01-22 DIAGNOSIS — E11621 Type 2 diabetes mellitus with foot ulcer: Secondary | ICD-10-CM | POA: Insufficient documentation

## 2015-01-22 DIAGNOSIS — L97513 Non-pressure chronic ulcer of other part of right foot with necrosis of muscle: Secondary | ICD-10-CM | POA: Insufficient documentation

## 2015-01-22 DIAGNOSIS — I509 Heart failure, unspecified: Secondary | ICD-10-CM | POA: Insufficient documentation

## 2015-01-22 DIAGNOSIS — G4730 Sleep apnea, unspecified: Secondary | ICD-10-CM | POA: Insufficient documentation

## 2015-01-22 DIAGNOSIS — F319 Bipolar disorder, unspecified: Secondary | ICD-10-CM | POA: Insufficient documentation

## 2015-01-22 DIAGNOSIS — F209 Schizophrenia, unspecified: Secondary | ICD-10-CM | POA: Insufficient documentation

## 2015-01-22 DIAGNOSIS — Z8673 Personal history of transient ischemic attack (TIA), and cerebral infarction without residual deficits: Secondary | ICD-10-CM | POA: Diagnosis not present

## 2015-01-22 DIAGNOSIS — E669 Obesity, unspecified: Secondary | ICD-10-CM | POA: Diagnosis not present

## 2015-01-22 DIAGNOSIS — Z794 Long term (current) use of insulin: Secondary | ICD-10-CM | POA: Insufficient documentation

## 2015-01-22 DIAGNOSIS — I10 Essential (primary) hypertension: Secondary | ICD-10-CM | POA: Diagnosis not present

## 2015-01-22 DIAGNOSIS — H269 Unspecified cataract: Secondary | ICD-10-CM | POA: Diagnosis not present

## 2015-01-22 LAB — GLUCOSE, CAPILLARY: Glucose-Capillary: 357 mg/dL — ABNORMAL HIGH (ref 65–99)

## 2015-01-23 ENCOUNTER — Encounter (HOSPITAL_BASED_OUTPATIENT_CLINIC_OR_DEPARTMENT_OTHER): Payer: Self-pay | Admitting: *Deleted

## 2015-01-23 ENCOUNTER — Emergency Department (HOSPITAL_BASED_OUTPATIENT_CLINIC_OR_DEPARTMENT_OTHER)
Admission: EM | Admit: 2015-01-23 | Discharge: 2015-01-24 | Disposition: A | Discharge status: Home / Self Care | Payer: No Typology Code available for payment source | Attending: Emergency Medicine | Admitting: Emergency Medicine

## 2015-01-23 DIAGNOSIS — Z86010 Personal history of colonic polyps: Secondary | ICD-10-CM | POA: Diagnosis not present

## 2015-01-23 DIAGNOSIS — Z9889 Other specified postprocedural states: Secondary | ICD-10-CM | POA: Insufficient documentation

## 2015-01-23 DIAGNOSIS — F319 Bipolar disorder, unspecified: Secondary | ICD-10-CM | POA: Diagnosis not present

## 2015-01-23 DIAGNOSIS — I10 Essential (primary) hypertension: Secondary | ICD-10-CM | POA: Insufficient documentation

## 2015-01-23 DIAGNOSIS — Z4801 Encounter for change or removal of surgical wound dressing: Secondary | ICD-10-CM | POA: Insufficient documentation

## 2015-01-23 DIAGNOSIS — F209 Schizophrenia, unspecified: Secondary | ICD-10-CM | POA: Insufficient documentation

## 2015-01-23 DIAGNOSIS — Z8619 Personal history of other infectious and parasitic diseases: Secondary | ICD-10-CM | POA: Diagnosis not present

## 2015-01-23 DIAGNOSIS — Z8701 Personal history of pneumonia (recurrent): Secondary | ICD-10-CM | POA: Diagnosis not present

## 2015-01-23 DIAGNOSIS — I5022 Chronic systolic (congestive) heart failure: Secondary | ICD-10-CM | POA: Diagnosis not present

## 2015-01-23 DIAGNOSIS — I252 Old myocardial infarction: Secondary | ICD-10-CM | POA: Diagnosis not present

## 2015-01-23 DIAGNOSIS — E11621 Type 2 diabetes mellitus with foot ulcer: Secondary | ICD-10-CM | POA: Diagnosis not present

## 2015-01-23 DIAGNOSIS — L97512 Non-pressure chronic ulcer of other part of right foot with fat layer exposed: Secondary | ICD-10-CM | POA: Diagnosis not present

## 2015-01-23 DIAGNOSIS — Z79899 Other long term (current) drug therapy: Secondary | ICD-10-CM | POA: Insufficient documentation

## 2015-01-23 DIAGNOSIS — Z9861 Coronary angioplasty status: Secondary | ICD-10-CM | POA: Diagnosis not present

## 2015-01-23 DIAGNOSIS — Z9981 Dependence on supplemental oxygen: Secondary | ICD-10-CM | POA: Diagnosis not present

## 2015-01-23 DIAGNOSIS — Z8673 Personal history of transient ischemic attack (TIA), and cerebral infarction without residual deficits: Secondary | ICD-10-CM | POA: Diagnosis not present

## 2015-01-23 DIAGNOSIS — Z794 Long term (current) use of insulin: Secondary | ICD-10-CM | POA: Insufficient documentation

## 2015-01-23 DIAGNOSIS — E669 Obesity, unspecified: Secondary | ICD-10-CM | POA: Diagnosis not present

## 2015-01-23 DIAGNOSIS — Z5189 Encounter for other specified aftercare: Secondary | ICD-10-CM

## 2015-01-23 NOTE — ED Notes (Addendum)
Here for concern for R foot diabetic wound/infection. Pinpoints to R 5th/ little toe. First started ~ 1 month ago. Gradually progressively worse. Seen yesterday at wound care center for debridement, here tonight d/t increased drainage today.  pt mentions bone involvement.  Taking clindamycin.

## 2015-01-23 NOTE — ED Provider Notes (Signed)
CSN: [id number]     Arrival date & time 01/23/15  2217 History  This chart was scribed for Erbie Arment, MD by Merlene Laughter, ED Scribe. This patient was seen in room MH09/MH09 and the patient's care was started at 11:36 PM.   Chief Complaint  Patient presents with  . Wound Infection   Patient is a 44 y.o. male presenting with wound check. The history is provided by the patient. No language interpreter was used.  Wound Check This is a recurrent problem. The current episode started 6 to 12 hours ago. The problem has not changed since onset.Pertinent negatives include no chest pain, no abdominal pain, no headaches and no shortness of breath. Nothing aggravates the symptoms. Nothing relieves the symptoms. He has tried nothing for the symptoms. The treatment provided no relief.   HPI Comments: Mitchell [name] is a 44 y.o. male with a PMHx of hypertension, hyperlipidemia, diabetes, who presents to the Emergency Department complaining of moderate right foot drainage onset earlier today. Wound care center at [facility] trimmed the bottom of his foot due to diabetic wound/infection yesterday. He reports associated pain onset today. Increased pain to left 5th toe. Patient is currently taking antibiotic medications. Patient is scheduled to be seen again in 6 days. He takes 3 tablets of clindamycin TID. He has been on the clindamycin medication for the past week  Past Medical History  Diagnosis Date  . Hypertension   . Hyperlipidemia   . CVA (cerebral infarction)     No residual deficits  . Non-ischemic cardiomyopathy     No ischemia on myoview, showed EF of 42%. 2D echo showed EF 50-55% with diatolic dysfunction in 2009  . Post-cardiac injury syndrome     History of cardiac injury from blunt trauma  . PVCs (premature ventricular contractions)   . History of colonic polyps   . Obesity   . Syncope     Recurrent, thought to be vasovagal. Also has h/o frequent PVCs.   . Chronic systolic heart failure  11/2010    a. NICM 4/14 EF 20-25%, TR mild  . SOB (shortness of breath)   . 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 Headache(784.0)   . Diabetes mellitus     TYPE II; UNCONTROLLED BY HEMOGLOBIN A1c; STABLE AS  PER DISCHARGE  . CHF (congestive heart failure)   . Myocardial infarction     at age 19 years old (while playing football)  . Stroke 2005    some left side weakness  . OSA (obstructive sleep apnea)     Being set up again for c-pap  . Pneumonia   . Depression     PTSD,   . Bipolar disorder   . Schizophrenia     Goes to [facility]   Past Surgical History  Procedure Laterality Date  . Cardiac catheterization  12/19/10    DIFFUSE NONOBSTRUCTIVE CAD; NONISCHEMIC CARDIOMYOPATHY; LEFT VENTRICULAR ANGIOGRAM WAS PERFORMED SECONDARY TO  ELEVATED LEFT VENTRICULAR FILLING PRESSURES  . Orif finger / thumb fracture Right   . Colonoscopy w/ polypectomy    . Multiple extractions with alveoloplasty  01/27/2014    "all my teeth; 4 Quadrants of alveoloplasty  . Multiple extractions with alveoloplasty N/A 01/27/2014    Procedure: EXTRACTION OF TEETH #'1, 2, 3, 4, 5, 6, 7, 8, 9, 10, 11, 12, 13, 14, 15, 16, 17, 20, 21, 22, 23, 24, 25, 26, 27, 28, 29, 31 and 32 WITH ALVEOLOPLASTY;  Surgeon: Charlynne Pander,  DDS;  Location: [facility] OR;  Service: Oral Surgery;  Laterality: N/A;   Family History  Problem Relation Age of Onset  . Heart disease Mother     MI  . Heart failure Mother   . Diabetes Mother     ALSO IN MOST OF HIS SIBLINGS; 2 UNLCES HAVE ALSO PASSED AWAY FROM DM  . Cardiomyopathy Mother   . Cancer - Ovarian Mother   . Heart disease Father   . Hypertension Father   . Diabetes Father    History  Substance Use Topics  . Smoking status: Never Smoker   . Smokeless tobacco: Never Used  . Alcohol Use: No   Review of Systems  Respiratory: Negative for shortness of breath.   Cardiovascular: Negative for chest pain.  Gastrointestinal: Negative for abdominal pain.  Skin:  Positive for wound (right foot with drainage).  Neurological: Negative for headaches.  All other systems reviewed and are negative.  Allergies  Nsaids  Home Medications   Prior to Admission medications   Medication Sig Start Date End Date Taking? Authorizing Provider  amLODipine (NORVASC) 5 MG tablet Take 5 mg by mouth daily.    Historical Provider, MD  carvedilol (COREG) 25 MG tablet Take 1 tablet (25 mg total) by mouth 2 (two) times daily with a meal. 12/09/14   Mitchell Molpus, MD  clindamycin (CLEOCIN) 150 MG capsule Take 3 capsules (450 mg total) by mouth 3 (three) times daily. 01/16/15   Ladona Mow, PA-C  cyclobenzaprine (FLEXERIL) 10 MG tablet Take 1 tablet (10 mg total) by mouth 2 (two) times daily as needed for muscle spasms. Patient not taking: Reported on 01/16/2015 09/08/14   Nelva Nay, MD  digoxin (LANOXIN) 0.25 MG tablet Take 1 tablet (0.25 mg total) by mouth daily. 12/09/14   Mitchell Molpus, MD  furosemide (LASIX) 40 MG tablet Take 1 tablet (40 mg total) by mouth 2 (two) times daily. 12/09/14   Mitchell Molpus, MD  HYDROcodone-acetaminophen (NORCO/VICODIN) 5-325 MG per tablet Take 1-2 tablets by mouth every 6 (six) hours as needed. 01/16/15   Ladona Mow, PA-C  insulin NPH-regular Human (NOVOLIN 70/30) (70-30) 100 UNIT/ML injection Inject 40 Units into the skin 2 (two) times daily with a meal. 12/09/14   Paula Libra, MD  naproxen sodium (ANAPROX) 220 MG tablet Take 440 mg by mouth 2 (two) times daily as needed (pain).    Historical Provider, MD  QUEtiapine (SEROQUEL) 100 MG tablet Take 1 tablet (100 mg total) by mouth at bedtime. Patient taking differently: Take 150 mg by mouth at bedtime.  11/25/13   Renae Fickle, MD  sertraline (ZOLOFT) 100 MG tablet Take 1 tablet (100 mg total) by mouth 2 (two) times daily. Patient taking differently: Take 100 mg by mouth 2 (two) times daily at 8 am and 10 pm.  11/25/13   Renae Fickle, MD  spironolactone (ALDACTONE) 50 MG tablet Take 1 tablet (50 mg  total) by mouth daily. 12/09/14 12/09/15  Paula Libra, MD   Triage Vitals: BP 139/86 mmHg  Pulse 96  Temp(Src) 98.5 F (36.9 C) (Oral)  Resp 20  Ht 6' (1.829 m)  Wt 278 lb (126.1 kg)  BMI 37.70 kg/m2  SpO2 100%   Physical Exam  Constitutional: He is oriented to person, place, and time. He appears well-developed and well-nourished. No distress.  HENT:  Head: Normocephalic and atraumatic.  Mouth/Throat: Oropharynx is clear and moist.  Eyes: Conjunctivae and EOM are normal. Pupils are equal, round, and reactive to light.  Neck:  Normal range of motion. Neck supple. No tracheal deviation present.  Cardiovascular: Normal rate, regular rhythm and normal heart sounds.   Pulmonary/Chest: Effort normal and breath sounds normal. No respiratory distress.  Abdominal: Soft. Bowel sounds are normal. There is no tenderness. There is no rebound and no guarding.  Musculoskeletal: Normal range of motion.  In tact DP pulse.  Neurological: He is alert and oriented to person, place, and time.  Skin: Skin is warm and dry.  Stage 2 ulcer to right foot. 1.25cm. Healing with secondary indention. Not able to express any fluid.   Psychiatric: He has a normal mood and affect. His behavior is normal.  Nursing note and vitals reviewed.  ED Course  Procedures (including critical care time)  DIAGNOSTIC STUDIES: Oxygen Saturation is 100% on RA, normal by my interpretation.    COORDINATION OF CARE: 11:44 PM- Discussed plans to wrap wound. Will give pt dose of insulin medication. Pt advised of plan for treatment and pt agrees.  Labs Review Labs Reviewed  BASIC METABOLIC PANEL - Abnormal; Notable for the following:    Sodium 134 (*)    Chloride 90 (*)    Glucose, Bld 368 (*)    BUN 36 (*)    Creatinine, Ser 1.54 (*)    GFR calc non Af Amer 53 (*)    All other components within normal limits  CBC WITH DIFFERENTIAL/PLATELET    Imaging Review No results found.   EKG Interpretation None     MDM    Final diagnoses:  None    Wound care, follow up with wound care center increase clindamycin QID.  Will refill patient's 70/30 as patient is currently out  I personally performed the services described in this documentation, which was scribed in my presence. The recorded information has been reviewed and is accurate.    Cy Blamer, MD 01/24/15 [phone]

## 2015-01-24 ENCOUNTER — Encounter (HOSPITAL_BASED_OUTPATIENT_CLINIC_OR_DEPARTMENT_OTHER): Payer: Self-pay | Admitting: Emergency Medicine

## 2015-01-24 LAB — CBC WITH DIFFERENTIAL/PLATELET
BASOS PCT: 0 % (ref 0–1)
Basophils Absolute: 0 10*3/uL (ref 0.0–0.1)
EOS ABS: 0.1 10*3/uL (ref 0.0–0.7)
EOS PCT: 1 % (ref 0–5)
HCT: 40.4 % (ref 39.0–52.0)
HEMOGLOBIN: 13.7 g/dL (ref 13.0–17.0)
LYMPHS PCT: 19 % (ref 12–46)
Lymphs Abs: 1.8 10*3/uL (ref 0.7–4.0)
MCH: 28.9 pg (ref 26.0–34.0)
MCHC: 33.9 g/dL (ref 30.0–36.0)
MCV: 85.2 fL (ref 78.0–100.0)
MONOS PCT: 8 % (ref 3–12)
Monocytes Absolute: 0.8 10*3/uL (ref 0.1–1.0)
Neutro Abs: 6.7 10*3/uL (ref 1.7–7.7)
Neutrophils Relative %: 72 % (ref 43–77)
Platelets: 157 10*3/uL (ref 150–400)
RBC: 4.74 MIL/uL (ref 4.22–5.81)
RDW: 12.2 % (ref 11.5–15.5)
WBC: 9.4 10*3/uL (ref 4.0–10.5)

## 2015-01-24 LAB — BASIC METABOLIC PANEL
ANION GAP: 14 (ref 5–15)
BUN: 36 mg/dL — AB (ref 6–20)
CHLORIDE: 90 mmol/L — AB (ref 101–111)
CO2: 30 mmol/L (ref 22–32)
Calcium: 9.5 mg/dL (ref 8.9–10.3)
Creatinine, Ser: 1.54 mg/dL — ABNORMAL HIGH (ref 0.61–1.24)
GFR, EST NON AFRICAN AMERICAN: 53 mL/min — AB (ref >60)
GLUCOSE: 368 mg/dL — AB (ref 65–99)
Potassium: 4.3 mmol/L (ref 3.5–5.1)
Sodium: 134 mmol/L — ABNORMAL LOW (ref 135–145)

## 2015-01-24 MED ORDER — CLINDAMYCIN HCL 300 MG PO CAPS: 300.0000 mg | ORAL_CAPSULE | Freq: Four times a day (QID) | ORAL | Status: DC

## 2015-01-24 MED ORDER — INSULIN NPH ISOPHANE & REGULAR (70-30) 100 UNIT/ML ~~LOC~~ SUSP: 40.0000 [IU] | Freq: Two times a day (BID) | SUBCUTANEOUS | Status: DC

## 2015-01-24 NOTE — ED Notes (Addendum)
Wound dressed per pt instructions -- aquacel applied, covered with gauze, with U-shaped cushion surrounding the wound, wrapped in kerlix. Requested and encouraged wife to learn about and participate in wound care, but she refused. Danna Hefty, RN

## 2015-01-25 ENCOUNTER — Other Ambulatory Visit: Payer: Self-pay | Admitting: Internal Medicine

## 2015-01-25 DIAGNOSIS — R52 Pain, unspecified: Secondary | ICD-10-CM

## 2015-01-29 ENCOUNTER — Encounter (HOSPITAL_BASED_OUTPATIENT_CLINIC_OR_DEPARTMENT_OTHER): Payer: No Typology Code available for payment source | Attending: Internal Medicine

## 2015-01-29 DIAGNOSIS — G4730 Sleep apnea, unspecified: Secondary | ICD-10-CM | POA: Insufficient documentation

## 2015-01-29 DIAGNOSIS — I739 Peripheral vascular disease, unspecified: Secondary | ICD-10-CM | POA: Diagnosis not present

## 2015-01-29 DIAGNOSIS — I10 Essential (primary) hypertension: Secondary | ICD-10-CM | POA: Diagnosis not present

## 2015-01-29 DIAGNOSIS — E11621 Type 2 diabetes mellitus with foot ulcer: Secondary | ICD-10-CM | POA: Insufficient documentation

## 2015-01-29 DIAGNOSIS — H269 Unspecified cataract: Secondary | ICD-10-CM | POA: Diagnosis not present

## 2015-01-29 DIAGNOSIS — I509 Heart failure, unspecified: Secondary | ICD-10-CM | POA: Diagnosis not present

## 2015-02-02 ENCOUNTER — Emergency Department (HOSPITAL_COMMUNITY)
Admission: EM | Admit: 2015-02-02 | Discharge: 2015-02-02 | Disposition: A | Discharge status: Home / Self Care | Payer: No Typology Code available for payment source | Attending: Emergency Medicine | Admitting: Emergency Medicine

## 2015-02-02 ENCOUNTER — Encounter (HOSPITAL_COMMUNITY): Payer: Self-pay | Admitting: *Deleted

## 2015-02-02 DIAGNOSIS — Z86010 Personal history of colonic polyps: Secondary | ICD-10-CM | POA: Diagnosis not present

## 2015-02-02 DIAGNOSIS — E119 Type 2 diabetes mellitus without complications: Secondary | ICD-10-CM | POA: Insufficient documentation

## 2015-02-02 DIAGNOSIS — Z9889 Other specified postprocedural states: Secondary | ICD-10-CM | POA: Insufficient documentation

## 2015-02-02 DIAGNOSIS — I10 Essential (primary) hypertension: Secondary | ICD-10-CM | POA: Diagnosis not present

## 2015-02-02 DIAGNOSIS — Z794 Long term (current) use of insulin: Secondary | ICD-10-CM | POA: Insufficient documentation

## 2015-02-02 DIAGNOSIS — Z8619 Personal history of other infectious and parasitic diseases: Secondary | ICD-10-CM | POA: Diagnosis not present

## 2015-02-02 DIAGNOSIS — I252 Old myocardial infarction: Secondary | ICD-10-CM | POA: Diagnosis not present

## 2015-02-02 DIAGNOSIS — Z8701 Personal history of pneumonia (recurrent): Secondary | ICD-10-CM | POA: Diagnosis not present

## 2015-02-02 DIAGNOSIS — F209 Schizophrenia, unspecified: Secondary | ICD-10-CM | POA: Diagnosis not present

## 2015-02-02 DIAGNOSIS — Z79899 Other long term (current) drug therapy: Secondary | ICD-10-CM | POA: Diagnosis not present

## 2015-02-02 DIAGNOSIS — Z5189 Encounter for other specified aftercare: Secondary | ICD-10-CM

## 2015-02-02 DIAGNOSIS — Z792 Long term (current) use of antibiotics: Secondary | ICD-10-CM | POA: Diagnosis not present

## 2015-02-02 DIAGNOSIS — Z9981 Dependence on supplemental oxygen: Secondary | ICD-10-CM | POA: Diagnosis not present

## 2015-02-02 DIAGNOSIS — G4733 Obstructive sleep apnea (adult) (pediatric): Secondary | ICD-10-CM | POA: Diagnosis not present

## 2015-02-02 DIAGNOSIS — Z8673 Personal history of transient ischemic attack (TIA), and cerebral infarction without residual deficits: Secondary | ICD-10-CM | POA: Diagnosis not present

## 2015-02-02 DIAGNOSIS — F319 Bipolar disorder, unspecified: Secondary | ICD-10-CM | POA: Diagnosis not present

## 2015-02-02 DIAGNOSIS — Z4801 Encounter for change or removal of surgical wound dressing: Secondary | ICD-10-CM | POA: Insufficient documentation

## 2015-02-02 DIAGNOSIS — I5022 Chronic systolic (congestive) heart failure: Secondary | ICD-10-CM | POA: Insufficient documentation

## 2015-02-02 DIAGNOSIS — E669 Obesity, unspecified: Secondary | ICD-10-CM | POA: Diagnosis not present

## 2015-02-02 NOTE — ED Notes (Signed)
Patient is alert and oriented x3.  He is here to get his found wound check and redressed. Patient is a type II diabetic and has a diabetic ulcer on the right pinky toe.

## 2015-02-02 NOTE — Discharge Instructions (Signed)
Pressure Ulcer A pressure ulcer is a sore that has formed from the breakdown of skin and exposure of deeper layers of tissue. It develops in areas of the body where there is unrelieved pressure. Pressure ulcers are usually found over a bony area, such as the shoulder blades, spine, lower back, hips, knees, ankles, and heels. Pressure ulcers vary in severity. Your health care provider may determine the severity (stage) of your pressure ulcer. The stages include:  Stage I--The skin is red, and when the skin is pressed, it stays red.  Stage II--The top layer of skin is gone, and there is a shallow, pink ulcer.  Stage III--The ulcer becomes deeper, and it is more difficult to see the whole wound. Also, there may be yellow or brown parts, as well as pink and red parts.  Stage IV--The ulcer may be deep and red, pink, brown, white, or yellow. Bone or muscle may be seen.  Unstageable pressure ulcer--The ulcer is covered almost completely with black, brown, or yellow tissue. It is not known how deep the ulcer is or what stage it is until this covering comes off.  Suspected deep tissue injury--A person's skin can be injured from pressure or pulling on the skin when his or her position is changed. The skin appears purple or maroon. There may not be an opening in the skin, but there could be a blood-filled blister. This deep tissue injury is often difficult to see in people with darker skin tones. The site may open and become deeper in time. However, early interventions will help the area heal and may prevent the area from opening. CAUSES  Pressure ulcers are caused by pressure against the skin that limits the flow of blood to the skin and nearby tissues. There are many risk factors that can lead to pressure sores. RISK FACTORS  Decreased ability to move.  Decreased ability to feel pain or discomfort.  Excessive skin moisture from urine, stool, sweat, or secretions.  Poor  nutrition.  Dehydration.  Tobacco, drug, or alcohol abuse.  Having someone pull on bedsheets that are under you, such as when health care workers are changing your position in a hospital bed.  Obesity.  Increased adult age.  Hospitalization in a critical care unit for longer than 4 days with use of medical devices.  Prolonged use of medical devices.  Critical illness.  Anemia.  Traumatic brain injury.  Spinal cord injury.  Stroke.  Diabetes.  Poor blood glucose control.  Low blood pressure (hypotension).  Low oxygen levels.  Medicines that reduce blood flow.  Infection. DIAGNOSIS  Your health care provider will diagnose your pressure ulcer based on its appearance. The health care provider may determine the stage of your pressure ulcer as well. Tests may be done to check for infection, to assess your circulation, or to check for other diseases, such as diabetes. TREATMENT  Treatment of your pressure ulcer begins with determining what stage the ulcer is in. Your treatment team may include your health care provider, a wound care specialist, a nutritionist, a physical therapist, and a surgeon. Possible treatments may include:   Moving or repositioning every 1-2 hours.  Using beds or mattresses to shift your body weight and pressure points frequently.  Improving your diet.  Cleaning and bandaging (dressing) the open wound.  Giving antibiotic medicines.  Removing damaged tissue.  Surgery and sometimes skin grafts. HOME CARE INSTRUCTIONS  If you were hospitalized, follow the care plan that was started in the hospital.    Avoid staying in the same position for more than 2 hours. Use padding, devices, or mattresses to cushion your pressure points as directed by your health care provider.  Eat a well-balanced diet. Take nutritional supplements and vitamins as directed by your health care provider.  Keep all follow-up appointments.  Only take over-the-counter or  prescription medicines for pain, fever, or discomfort as directed by your health care provider. SEEK MEDICAL CARE IF:   Your pressure ulcer is not improving.  You do not know how to care for your pressure ulcer.  You notice other areas of redness on your skin.  You have a fever. SEEK IMMEDIATE MEDICAL CARE IF:   You have increasing redness, swelling, or pain in your pressure ulcer.  You notice pus coming from your pressure ulcer.  You notice a bad smell coming from the wound or dressing.  Your pressure ulcer opens up again. Document Released: 07/17/2005 Document Revised: 07/22/2013 Document Reviewed: 03/24/2013 Exit[employer] Patient Information 2015 [employer]. This information is not intended to replace advice given to you by your health care provider. Make sure you discuss any questions you have with your health care provider.  

## 2015-02-02 NOTE — ED Provider Notes (Signed)
CSN: [id number]     Arrival date & time 02/02/15  1953 History  This chart was scribed for Antony Madura, PA-C, working with Arby Barrette, MD by Octavia Heir, ED Scribe. This patient was seen in room WTR5/WTR5 and the patient's care was started at 10:16 PM.    Chief Complaint  Patient presents with  . Wound Check    The history is provided by the patient. No language interpreter was used.   HPI Comments: Mitchell [name] is a 44 y.o. male who has a hx of DM presents to the Emergency Department wanting a wound check on his diabetic ulcer under his right 5th digit. Pt notes he was seen at the wound clinic on 01/29/15 and was told to change his dressing today. Pt was told to repack his wound but his daughter was unable to find what the wound was packed with. Patient is concerned that it became lost in his wound site. He goes back to the clinic on 02/05/15. Pt denies fever, numbness, worsening erythema or pain, and pus drainage.   Past Medical History  Diagnosis Date  . Hypertension   . Hyperlipidemia   . CVA (cerebral infarction)     No residual deficits  . Non-ischemic cardiomyopathy     No ischemia on myoview, showed EF of 42%. 2D echo showed EF 50-55% with diatolic dysfunction in 2009  . Post-cardiac injury syndrome     History of cardiac injury from blunt trauma  . PVCs (premature ventricular contractions)   . History of colonic polyps   . Obesity   . Syncope     Recurrent, thought to be vasovagal. Also has h/o frequent PVCs.   . Chronic systolic heart failure 11/2010    a. NICM 4/14 EF 20-25%, TR mild  . SOB (shortness of breath)   . 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 Headache(784.0)   . Diabetes mellitus     TYPE II; UNCONTROLLED BY HEMOGLOBIN A1c; STABLE AS  PER DISCHARGE  . CHF (congestive heart failure)   . Myocardial infarction     at age 32 years old (while playing football)  . Stroke 2005    some left side weakness  . OSA (obstructive sleep apnea)     Being set up again  for c-pap  . Pneumonia   . Depression     PTSD,   . Bipolar disorder   . Schizophrenia     Goes to [employer]   Past Surgical History  Procedure Laterality Date  . Cardiac catheterization  12/19/10    DIFFUSE NONOBSTRUCTIVE CAD; NONISCHEMIC CARDIOMYOPATHY; LEFT VENTRICULAR ANGIOGRAM WAS PERFORMED SECONDARY TO  ELEVATED LEFT VENTRICULAR FILLING PRESSURES  . Orif finger / thumb fracture Right   . Colonoscopy w/ polypectomy    . Multiple extractions with alveoloplasty  01/27/2014    "all my teeth; 4 Quadrants of alveoloplasty  . Multiple extractions with alveoloplasty N/A 01/27/2014    Procedure: EXTRACTION OF TEETH #'1, 2, 3, 4, 5, 6, 7, 8, 9, 10, 11, 12, 13, 14, 15, 16, 17, 20, 21, 22, 23, 24, 25, 26, 27, 28, 29, 31 and 32 WITH ALVEOLOPLASTY;  Surgeon: Charlynne Pander, DDS;  Location: [facility] OR;  Service: Oral Surgery;  Laterality: N/A;   Family History  Problem Relation Age of Onset  . Heart disease Mother     MI  . Heart failure Mother   . Diabetes Mother     ALSO IN MOST OF HIS SIBLINGS; 2  UNLCES HAVE ALSO PASSED AWAY FROM DM  . Cardiomyopathy Mother   . Cancer - Ovarian Mother   . Heart disease Father   . Hypertension Father   . Diabetes Father    History  Substance Use Topics  . Smoking status: Never Smoker   . Smokeless tobacco: Never Used  . Alcohol Use: No    Review of Systems  Constitutional: Negative for fever.  Skin: Positive for wound.  All other systems reviewed and are negative.   Allergies  Nsaids  Home Medications   Prior to Admission medications   Medication Sig Start Date End Date Taking? Authorizing Provider  amLODipine (NORVASC) 5 MG tablet Take 5 mg by mouth daily.    Historical Provider, MD  carvedilol (COREG) 25 MG tablet Take 1 tablet (25 mg total) by mouth 2 (two) times daily with a meal. 12/09/14   John Molpus, MD  clindamycin (CLEOCIN) 150 MG capsule Take 3 capsules (450 mg total) by mouth 3 (three) times daily. 01/16/15   Ladona Mow, PA-C  clindamycin (CLEOCIN) 300 MG capsule Take 1 capsule (300 mg total) by mouth 4 (four) times daily. X 7 days 01/24/15   April Palumbo, MD  cyclobenzaprine (FLEXERIL) 10 MG tablet Take 1 tablet (10 mg total) by mouth 2 (two) times daily as needed for muscle spasms. Patient not taking: Reported on 01/16/2015 09/08/14   Nelva Nay, MD  digoxin (LANOXIN) 0.25 MG tablet Take 1 tablet (0.25 mg total) by mouth daily. 12/09/14   John Molpus, MD  furosemide (LASIX) 40 MG tablet Take 1 tablet (40 mg total) by mouth 2 (two) times daily. 12/09/14   John Molpus, MD  HYDROcodone-acetaminophen (NORCO/VICODIN) 5-325 MG per tablet Take 1-2 tablets by mouth every 6 (six) hours as needed. 01/16/15   Ladona Mow, PA-C  insulin NPH-regular Human (NOVOLIN 70/30) (70-30) 100 UNIT/ML injection Inject 40 Units into the skin 2 (two) times daily with a meal. 12/09/14   Paula Libra, MD  insulin NPH-regular Human (NOVOLIN 70/30) (70-30) 100 UNIT/ML injection Inject 40 Units into the skin 2 (two) times daily with a meal. 01/24/15   April Palumbo, MD  naproxen sodium (ANAPROX) 220 MG tablet Take 440 mg by mouth 2 (two) times daily as needed (pain).    Historical Provider, MD  QUEtiapine (SEROQUEL) 100 MG tablet Take 1 tablet (100 mg total) by mouth at bedtime. Patient taking differently: Take 150 mg by mouth at bedtime.  11/25/13   Renae Fickle, MD  sertraline (ZOLOFT) 100 MG tablet Take 1 tablet (100 mg total) by mouth 2 (two) times daily. Patient taking differently: Take 100 mg by mouth 2 (two) times daily at 8 am and 10 pm.  11/25/13   Renae Fickle, MD  spironolactone (ALDACTONE) 50 MG tablet Take 1 tablet (50 mg total) by mouth daily. 12/09/14 12/09/15  Paula Libra, MD    Triage vitals: BP 128/95 mmHg  Pulse 98  Temp(Src) 97.9 F (36.6 C) (Oral)  Resp 18  Ht 6' (1.829 m)  Wt 272 lb (123.378 kg)  BMI 36.88 kg/m2  SpO2 100%  Physical Exam  Constitutional: He is oriented to person, place, and time. He appears  well-developed and well-nourished. No distress.  HENT:  Head: Normocephalic and atraumatic.  Eyes: Conjunctivae and EOM are normal. No scleral icterus.  Neck: Normal range of motion.  Cardiovascular: Normal rate, regular rhythm and intact distal pulses.   DP and PT pulses 2+ in the right lower extremity; capillary refill brisk in all  digits of right foot.  Pulmonary/Chest: Effort normal. No respiratory distress.  Musculoskeletal: Normal range of motion.       Right foot: There is no bony tenderness, no crepitus and no deformity.       Feet:  Neurological: He is alert and oriented to person, place, and time. He exhibits normal muscle tone. Coordination normal.  Sensation to light touch intact in all extremities. GCS 15.  Skin: Skin is warm and dry. No rash noted. He is not diaphoretic. No pallor.  Psychiatric: He has a normal mood and affect. His behavior is normal.  Nursing note and vitals reviewed.   ED Course  Procedures  DIAGNOSTIC STUDIES: Oxygen Saturation is 100% on RA, normal by my interpretation.  COORDINATION OF CARE: 10:24 PM Discussed treatment plan which includes redress wound and follow up with doctor with pt at bedside and pt agreed to plan.  Labs Review Labs Reviewed - No data to display  Imaging Review No results found.   EKG Interpretation None      MDM   Final diagnoses:  Encounter for wound re-check    44 year old male presents to the emergency department for further evaluation of a diabetic pressure ulcer to his right foot. He has been seen 2 times in the past for same. Patient is concerned about a piece of packing being lost within his wound. I visualize no evidence of packing to the wound site and no evidence of deeper wound or tracking. No evidence of secondary infection; granulation tissue appears appropriate. Patient is neurovascularly intact.  I have explained to the patient that he must follow-up with his wound care doctor. I have mentioned  that I do not feel it is indicated to re-open the wound in order to attempt to find the piece of packing that he feels is "lost". Patient has been told that his wound doctor may complete this if desired. Have advised that patient continue with proper wound care. Return precautions discussed and provided. Patient agreeable to plan with no unaddressed concerns. Patient discharged in good condition.  I personally performed the services described in this documentation, which was scribed in my presence. The recorded information has been reviewed and is accurate.   Filed Vitals:   02/02/15 2013 02/02/15 2243  BP: 128/95 146/84  Pulse: 98 93  Temp: 97.9 F (36.6 C)   TempSrc: Oral   Resp: 18 14  Height: 6' (1.829 m)   Weight: 272 lb (123.378 kg)   SpO2: 100% 100%     Antony Madura, PA-C 02/03/15 1931  Arby Barrette, MD 02/04/15 2340

## 2015-02-05 ENCOUNTER — Ambulatory Visit (HOSPITAL_COMMUNITY)
Admission: RE | Admit: 2015-02-05 | Discharge: 2015-02-05 | Disposition: A | Discharge status: Home / Self Care | Payer: No Typology Code available for payment source | Source: Ambulatory Visit | Attending: Internal Medicine | Admitting: Internal Medicine

## 2015-02-05 DIAGNOSIS — R262 Difficulty in walking, not elsewhere classified: Secondary | ICD-10-CM | POA: Diagnosis not present

## 2015-02-05 DIAGNOSIS — L97519 Non-pressure chronic ulcer of other part of right foot with unspecified severity: Secondary | ICD-10-CM | POA: Insufficient documentation

## 2015-02-05 DIAGNOSIS — E11621 Type 2 diabetes mellitus with foot ulcer: Secondary | ICD-10-CM | POA: Diagnosis not present

## 2015-02-05 DIAGNOSIS — R52 Pain, unspecified: Secondary | ICD-10-CM

## 2015-02-05 MED ORDER — GADOBENATE DIMEGLUMINE 529 MG/ML IV SOLN
20.0000 mL | Freq: Once | INTRAVENOUS | Status: AC | PRN
  Administered 2015-02-05: 20 mL via INTRAVENOUS

## 2015-02-08 DIAGNOSIS — E11621 Type 2 diabetes mellitus with foot ulcer: Secondary | ICD-10-CM | POA: Diagnosis not present

## 2015-02-09 ENCOUNTER — Other Ambulatory Visit: Payer: Self-pay | Admitting: Internal Medicine

## 2015-02-12 DIAGNOSIS — E11621 Type 2 diabetes mellitus with foot ulcer: Secondary | ICD-10-CM | POA: Diagnosis not present

## 2015-02-18 DIAGNOSIS — E11621 Type 2 diabetes mellitus with foot ulcer: Secondary | ICD-10-CM | POA: Diagnosis not present

## 2015-02-18 LAB — GLUCOSE, CAPILLARY: Glucose-Capillary: 405 mg/dL — ABNORMAL HIGH (ref 65–99)

## 2015-02-26 DIAGNOSIS — E11621 Type 2 diabetes mellitus with foot ulcer: Secondary | ICD-10-CM | POA: Diagnosis not present

## 2015-03-03 ENCOUNTER — Telehealth (HOSPITAL_COMMUNITY): Payer: Self-pay | Admitting: Vascular Surgery

## 2015-03-03 NOTE — Telephone Encounter (Signed)
Left pt a message to get him scheduled for f/u appt

## 2015-03-05 ENCOUNTER — Encounter (HOSPITAL_BASED_OUTPATIENT_CLINIC_OR_DEPARTMENT_OTHER): Payer: No Typology Code available for payment source | Attending: Internal Medicine

## 2015-03-05 DIAGNOSIS — E11621 Type 2 diabetes mellitus with foot ulcer: Secondary | ICD-10-CM | POA: Diagnosis present

## 2015-03-05 DIAGNOSIS — E1151 Type 2 diabetes mellitus with diabetic peripheral angiopathy without gangrene: Secondary | ICD-10-CM | POA: Insufficient documentation

## 2015-03-05 DIAGNOSIS — Z794 Long term (current) use of insulin: Secondary | ICD-10-CM | POA: Diagnosis not present

## 2015-03-05 DIAGNOSIS — L97411 Non-pressure chronic ulcer of right heel and midfoot limited to breakdown of skin: Secondary | ICD-10-CM | POA: Diagnosis not present

## 2015-03-05 DIAGNOSIS — L84 Corns and callosities: Secondary | ICD-10-CM | POA: Diagnosis not present

## 2015-03-05 DIAGNOSIS — I429 Cardiomyopathy, unspecified: Secondary | ICD-10-CM | POA: Insufficient documentation

## 2015-03-05 DIAGNOSIS — G4730 Sleep apnea, unspecified: Secondary | ICD-10-CM | POA: Diagnosis not present

## 2015-03-05 DIAGNOSIS — I5022 Chronic systolic (congestive) heart failure: Secondary | ICD-10-CM | POA: Insufficient documentation

## 2015-03-05 LAB — GLUCOSE, CAPILLARY: Glucose-Capillary: 227 mg/dL — ABNORMAL HIGH (ref 65–99)

## 2015-03-12 DIAGNOSIS — E11621 Type 2 diabetes mellitus with foot ulcer: Secondary | ICD-10-CM | POA: Diagnosis not present

## 2015-03-12 NOTE — Telephone Encounter (Signed)
Closing encounter left pt several messages to make f/u appt

## 2015-03-16 ENCOUNTER — Ambulatory Visit: Payer: Self-pay | Admitting: Cardiology

## 2015-03-17 ENCOUNTER — Ambulatory Visit: Payer: Self-pay | Admitting: Cardiology

## 2015-03-19 DIAGNOSIS — E11621 Type 2 diabetes mellitus with foot ulcer: Secondary | ICD-10-CM | POA: Diagnosis not present

## 2015-03-31 ENCOUNTER — Telehealth: Payer: Self-pay | Admitting: Endocrinology

## 2015-03-31 ENCOUNTER — Ambulatory Visit: Payer: Self-pay | Admitting: Endocrinology

## 2015-03-31 DIAGNOSIS — Z0289 Encounter for other administrative examinations: Secondary | ICD-10-CM

## 2015-03-31 NOTE — Telephone Encounter (Signed)
No follow up necessary.  

## 2015-03-31 NOTE — Telephone Encounter (Signed)
Patient no showed today's appt. Please advise on how to follow up. °A. No follow up necessary. °B. Follow up urgent. Contact patient immediately. °C. Follow up necessary. Contact patient and schedule visit in ___ days. °D. Follow up advised. Contact patient and schedule visit in ____weeks. ° °

## 2015-05-06 ENCOUNTER — Encounter (HOSPITAL_BASED_OUTPATIENT_CLINIC_OR_DEPARTMENT_OTHER): Payer: No Typology Code available for payment source | Attending: Internal Medicine

## 2015-05-06 DIAGNOSIS — G4730 Sleep apnea, unspecified: Secondary | ICD-10-CM | POA: Insufficient documentation

## 2015-05-06 DIAGNOSIS — E669 Obesity, unspecified: Secondary | ICD-10-CM | POA: Diagnosis not present

## 2015-05-06 DIAGNOSIS — Z794 Long term (current) use of insulin: Secondary | ICD-10-CM | POA: Diagnosis not present

## 2015-05-06 DIAGNOSIS — F209 Schizophrenia, unspecified: Secondary | ICD-10-CM | POA: Diagnosis not present

## 2015-05-06 DIAGNOSIS — E11621 Type 2 diabetes mellitus with foot ulcer: Secondary | ICD-10-CM | POA: Diagnosis not present

## 2015-05-06 DIAGNOSIS — I5022 Chronic systolic (congestive) heart failure: Secondary | ICD-10-CM | POA: Diagnosis not present

## 2015-05-06 DIAGNOSIS — L97511 Non-pressure chronic ulcer of other part of right foot limited to breakdown of skin: Secondary | ICD-10-CM | POA: Diagnosis not present

## 2015-05-06 DIAGNOSIS — I10 Essential (primary) hypertension: Secondary | ICD-10-CM | POA: Insufficient documentation

## 2015-05-14 DIAGNOSIS — L97511 Non-pressure chronic ulcer of other part of right foot limited to breakdown of skin: Secondary | ICD-10-CM | POA: Diagnosis not present

## 2015-05-21 DIAGNOSIS — L97511 Non-pressure chronic ulcer of other part of right foot limited to breakdown of skin: Secondary | ICD-10-CM | POA: Diagnosis not present

## 2015-06-03 ENCOUNTER — Encounter (HOSPITAL_BASED_OUTPATIENT_CLINIC_OR_DEPARTMENT_OTHER): Payer: No Typology Code available for payment source | Attending: Internal Medicine

## 2015-06-03 DIAGNOSIS — E11621 Type 2 diabetes mellitus with foot ulcer: Secondary | ICD-10-CM | POA: Diagnosis not present

## 2015-06-03 DIAGNOSIS — G4730 Sleep apnea, unspecified: Secondary | ICD-10-CM | POA: Insufficient documentation

## 2015-06-03 DIAGNOSIS — E1151 Type 2 diabetes mellitus with diabetic peripheral angiopathy without gangrene: Secondary | ICD-10-CM | POA: Insufficient documentation

## 2015-06-03 DIAGNOSIS — I509 Heart failure, unspecified: Secondary | ICD-10-CM | POA: Diagnosis not present

## 2015-06-03 DIAGNOSIS — L97512 Non-pressure chronic ulcer of other part of right foot with fat layer exposed: Secondary | ICD-10-CM | POA: Diagnosis not present

## 2015-06-03 DIAGNOSIS — E1140 Type 2 diabetes mellitus with diabetic neuropathy, unspecified: Secondary | ICD-10-CM | POA: Insufficient documentation

## 2015-06-03 DIAGNOSIS — I110 Hypertensive heart disease with heart failure: Secondary | ICD-10-CM | POA: Diagnosis not present

## 2015-06-11 DIAGNOSIS — E11621 Type 2 diabetes mellitus with foot ulcer: Secondary | ICD-10-CM | POA: Diagnosis not present

## 2015-06-18 DIAGNOSIS — E11621 Type 2 diabetes mellitus with foot ulcer: Secondary | ICD-10-CM | POA: Diagnosis not present

## 2015-06-21 ENCOUNTER — Encounter (HOSPITAL_COMMUNITY): Payer: Self-pay | Admitting: Emergency Medicine

## 2015-06-21 ENCOUNTER — Emergency Department (HOSPITAL_COMMUNITY): Payer: No Typology Code available for payment source

## 2015-06-21 ENCOUNTER — Emergency Department (HOSPITAL_COMMUNITY)
Admission: EM | Admit: 2015-06-21 | Discharge: 2015-06-21 | Disposition: A | Discharge status: Home / Self Care | Payer: No Typology Code available for payment source | Attending: Physician Assistant | Admitting: Physician Assistant

## 2015-06-21 DIAGNOSIS — G4733 Obstructive sleep apnea (adult) (pediatric): Secondary | ICD-10-CM | POA: Diagnosis not present

## 2015-06-21 DIAGNOSIS — Z8619 Personal history of other infectious and parasitic diseases: Secondary | ICD-10-CM | POA: Insufficient documentation

## 2015-06-21 DIAGNOSIS — Z86010 Personal history of colonic polyps: Secondary | ICD-10-CM | POA: Insufficient documentation

## 2015-06-21 DIAGNOSIS — Z792 Long term (current) use of antibiotics: Secondary | ICD-10-CM | POA: Insufficient documentation

## 2015-06-21 DIAGNOSIS — Z794 Long term (current) use of insulin: Secondary | ICD-10-CM | POA: Insufficient documentation

## 2015-06-21 DIAGNOSIS — F319 Bipolar disorder, unspecified: Secondary | ICD-10-CM | POA: Diagnosis not present

## 2015-06-21 DIAGNOSIS — Y998 Other external cause status: Secondary | ICD-10-CM | POA: Diagnosis not present

## 2015-06-21 DIAGNOSIS — Z79899 Other long term (current) drug therapy: Secondary | ICD-10-CM | POA: Insufficient documentation

## 2015-06-21 DIAGNOSIS — E669 Obesity, unspecified: Secondary | ICD-10-CM | POA: Diagnosis not present

## 2015-06-21 DIAGNOSIS — S199XXA Unspecified injury of neck, initial encounter: Secondary | ICD-10-CM | POA: Diagnosis not present

## 2015-06-21 DIAGNOSIS — Y92410 Unspecified street and highway as the place of occurrence of the external cause: Secondary | ICD-10-CM | POA: Diagnosis not present

## 2015-06-21 DIAGNOSIS — I493 Ventricular premature depolarization: Secondary | ICD-10-CM | POA: Diagnosis not present

## 2015-06-21 DIAGNOSIS — E119 Type 2 diabetes mellitus without complications: Secondary | ICD-10-CM | POA: Diagnosis not present

## 2015-06-21 DIAGNOSIS — I5022 Chronic systolic (congestive) heart failure: Secondary | ICD-10-CM | POA: Insufficient documentation

## 2015-06-21 DIAGNOSIS — Z8673 Personal history of transient ischemic attack (TIA), and cerebral infarction without residual deficits: Secondary | ICD-10-CM | POA: Diagnosis not present

## 2015-06-21 DIAGNOSIS — F209 Schizophrenia, unspecified: Secondary | ICD-10-CM | POA: Insufficient documentation

## 2015-06-21 DIAGNOSIS — Z8701 Personal history of pneumonia (recurrent): Secondary | ICD-10-CM | POA: Insufficient documentation

## 2015-06-21 DIAGNOSIS — Z9889 Other specified postprocedural states: Secondary | ICD-10-CM | POA: Diagnosis not present

## 2015-06-21 DIAGNOSIS — S6992XA Unspecified injury of left wrist, hand and finger(s), initial encounter: Secondary | ICD-10-CM | POA: Diagnosis not present

## 2015-06-21 DIAGNOSIS — Y9389 Activity, other specified: Secondary | ICD-10-CM | POA: Insufficient documentation

## 2015-06-21 DIAGNOSIS — I10 Essential (primary) hypertension: Secondary | ICD-10-CM | POA: Diagnosis not present

## 2015-06-21 DIAGNOSIS — I252 Old myocardial infarction: Secondary | ICD-10-CM | POA: Insufficient documentation

## 2015-06-21 DIAGNOSIS — Z9981 Dependence on supplemental oxygen: Secondary | ICD-10-CM | POA: Diagnosis not present

## 2015-06-21 MED ORDER — CYCLOBENZAPRINE HCL 5 MG PO TABS: 5.0000 mg | ORAL_TABLET | Freq: Three times a day (TID) | ORAL | Status: DC | PRN

## 2015-06-21 MED ORDER — ACETAMINOPHEN 325 MG PO TABS
650.0000 mg | ORAL_TABLET | Freq: Once | ORAL | Status: AC
  Administered 2015-06-21: 650 mg via ORAL
  Filled 2015-06-21: qty 2

## 2015-06-21 MED ORDER — ACETAMINOPHEN 500 MG PO TABS: 500.0000 mg | ORAL_TABLET | Freq: Four times a day (QID) | ORAL | Status: DC | PRN
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
## 2015-06-21 NOTE — Discharge Instructions (Signed)

## 2015-06-25 ENCOUNTER — Emergency Department (INDEPENDENT_AMBULATORY_CARE_PROVIDER_SITE_OTHER)
Admission: EM | Admit: 2015-06-25 | Discharge: 2015-06-25 | Disposition: A | Discharge status: Home / Self Care | Payer: Self-pay | Source: Home / Self Care

## 2015-06-25 ENCOUNTER — Encounter (HOSPITAL_COMMUNITY): Payer: Self-pay | Admitting: Emergency Medicine

## 2015-06-25 DIAGNOSIS — M542 Cervicalgia: Secondary | ICD-10-CM

## 2015-06-25 DIAGNOSIS — M79609 Pain in unspecified limb: Secondary | ICD-10-CM

## 2015-06-25 DIAGNOSIS — R202 Paresthesia of skin: Secondary | ICD-10-CM
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
## 2015-06-25 NOTE — Discharge Instructions (Signed)
Paresthesia  Paresthesia is a burning or prickling feeling. This feeling can happen in any part of the body. It often happens in the hands, arms, legs, or feet. Usually, it is not painful. In most cases, the feeling goes away in a short time and is not a sign of a serious problem.  HOME CARE  · Avoid drinking alcohol.  · Try massage or needle therapy (acupuncture) to help with your problems.  · Keep all follow-up visits as told by your doctor. This is important.  GET HELP IF:  · You keep on having episodes of paresthesia.  · Your burning or prickling feeling gets worse when you walk.  · You have pain or cramps.  · You feel dizzy.  · You have a rash.  GET HELP RIGHT AWAY IF:  · You feel weak.  · You have trouble walking or moving.  · You have problems speaking, understanding, or seeing.  · You feel confused.  · You cannot control when you pee (urinate) or poop (bowel movement).  · You lose feeling (numbness) after an injury.  · You pass out (faint).     This information is not intended to replace advice given to you by your health care provider. Make sure you discuss any questions you have with your health care provider.     Document Released: 06/29/2008 Document Revised: 12/01/2014 Document Reviewed: 07/13/2014  Elsevier Interactive Patient Education ©2016 [employer]

## 2015-06-28 ENCOUNTER — Emergency Department (HOSPITAL_COMMUNITY)
Admission: EM | Admit: 2015-06-28 | Discharge: 2015-06-28 | Disposition: A | Discharge status: Home / Self Care | Payer: No Typology Code available for payment source | Attending: Emergency Medicine | Admitting: Emergency Medicine

## 2015-06-28 ENCOUNTER — Encounter (HOSPITAL_COMMUNITY): Payer: Self-pay | Admitting: Emergency Medicine

## 2015-06-28 DIAGNOSIS — I10 Essential (primary) hypertension: Secondary | ICD-10-CM | POA: Insufficient documentation

## 2015-06-28 DIAGNOSIS — Z8673 Personal history of transient ischemic attack (TIA), and cerebral infarction without residual deficits: Secondary | ICD-10-CM | POA: Insufficient documentation

## 2015-06-28 DIAGNOSIS — M62830 Muscle spasm of back: Secondary | ICD-10-CM | POA: Insufficient documentation

## 2015-06-28 DIAGNOSIS — Z9889 Other specified postprocedural states: Secondary | ICD-10-CM | POA: Insufficient documentation

## 2015-06-28 DIAGNOSIS — F4310 Post-traumatic stress disorder, unspecified: Secondary | ICD-10-CM | POA: Insufficient documentation

## 2015-06-28 DIAGNOSIS — I5022 Chronic systolic (congestive) heart failure: Secondary | ICD-10-CM | POA: Insufficient documentation

## 2015-06-28 DIAGNOSIS — F319 Bipolar disorder, unspecified: Secondary | ICD-10-CM | POA: Diagnosis not present

## 2015-06-28 DIAGNOSIS — M62838 Other muscle spasm: Secondary | ICD-10-CM | POA: Insufficient documentation

## 2015-06-28 DIAGNOSIS — G4733 Obstructive sleep apnea (adult) (pediatric): Secondary | ICD-10-CM | POA: Diagnosis not present

## 2015-06-28 DIAGNOSIS — Z8619 Personal history of other infectious and parasitic diseases: Secondary | ICD-10-CM | POA: Insufficient documentation

## 2015-06-28 DIAGNOSIS — F209 Schizophrenia, unspecified: Secondary | ICD-10-CM | POA: Diagnosis not present

## 2015-06-28 DIAGNOSIS — Z792 Long term (current) use of antibiotics: Secondary | ICD-10-CM | POA: Diagnosis not present

## 2015-06-28 DIAGNOSIS — Z9981 Dependence on supplemental oxygen: Secondary | ICD-10-CM | POA: Diagnosis not present

## 2015-06-28 DIAGNOSIS — E119 Type 2 diabetes mellitus without complications: Secondary | ICD-10-CM | POA: Insufficient documentation

## 2015-06-28 DIAGNOSIS — I252 Old myocardial infarction: Secondary | ICD-10-CM | POA: Diagnosis not present

## 2015-06-28 DIAGNOSIS — Z86010 Personal history of colonic polyps: Secondary | ICD-10-CM | POA: Diagnosis not present

## 2015-06-28 DIAGNOSIS — E669 Obesity, unspecified: Secondary | ICD-10-CM | POA: Diagnosis not present

## 2015-06-28 DIAGNOSIS — Z794 Long term (current) use of insulin: Secondary | ICD-10-CM | POA: Insufficient documentation

## 2015-06-28 DIAGNOSIS — Z79899 Other long term (current) drug therapy: Secondary | ICD-10-CM | POA: Diagnosis not present

## 2015-06-28 DIAGNOSIS — R202 Paresthesia of skin: Secondary | ICD-10-CM | POA: Diagnosis present

## 2015-06-28 MED ORDER — BUPIVACAINE HCL 0.5 % IJ SOLN
20.0000 mL | Freq: Once | INTRAMUSCULAR | Status: AC
  Administered 2015-06-28: 20 mL
  Filled 2015-06-28: qty 20
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
## 2015-06-28 NOTE — ED Notes (Addendum)
Awake. Verbally responsive. A/O x4. Resp even and unlabored. No audible adventitious breath sounds noted. ABC's intact. Pt reported posterior neck and entire back pain with tingling/numbness to lt 3rd and 4th fingers. Seen at Urgent Care with same problem. Pt took Flexeril until completely gone. Pt stated that he took Tylenol but it made his stomach hurt and he did not take any other pain medication.

## 2015-06-28 NOTE — ED Notes (Signed)
Pt reports that MD addressed his BP.
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
## 2015-06-28 NOTE — Discharge Instructions (Signed)
Back Exercises The following exercises strengthen the muscles that help to support the back. They also help to keep the lower back flexible. Doing these exercises can help to prevent back pain or lessen existing pain. If you have back pain or discomfort, try doing these exercises 2-3 times each day or as told by your health care provider. When the pain goes away, do them once each day, but increase the number of times that you repeat the steps for each exercise (do more repetitions). If you do not have back pain or discomfort, do these exercises once each day or as told by your health care provider. EXERCISES Single Knee to Chest Repeat these steps 3-5 times for each leg: 1. Lie on your back on a firm bed or the floor with your legs extended. 2. Bring one knee to your chest. Your other leg should stay extended and in contact with the floor. 3. Hold your knee in place by grabbing your knee or thigh. 4. Pull on your knee until you feel a gentle stretch in your lower back. 5. Hold the stretch for 10-30 seconds. 6. Slowly release and straighten your leg. Pelvic Tilt Repeat these steps 5-10 times: 1. Lie on your back on a firm bed or the floor with your legs extended. 2. Bend your knees so they are pointing toward the ceiling and your feet are flat on the floor. 3. Tighten your lower abdominal muscles to press your lower back against the floor. This motion will tilt your pelvis so your tailbone points up toward the ceiling instead of pointing to your feet or the floor. 4. With gentle tension and even breathing, hold this position for 5-10 seconds. Cat-Cow Repeat these steps until your lower back becomes more flexible: 1. Get into a hands-and-knees position on a firm surface. Keep your hands under your shoulders, and keep your knees under your hips. You may place padding under your knees for comfort. 2. Let your head hang down, and point your tailbone toward the floor so your lower back becomes  rounded like the back of a cat. 3. Hold this position for 5 seconds. 4. Slowly lift your head and point your tailbone up toward the ceiling so your back forms a sagging arch like the back of a cow. 5. Hold this position for 5 seconds. Press-Ups Repeat these steps 5-10 times: 1. Lie on your abdomen (face-down) on the floor. 2. Place your palms near your head, about shoulder-width apart. 3. While you keep your back as relaxed as possible and keep your hips on the floor, slowly straighten your arms to raise the top half of your body and lift your shoulders. Do not use your back muscles to raise your upper torso. You may adjust the placement of your hands to make yourself more comfortable. 4. Hold this position for 5 seconds while you keep your back relaxed. 5. Slowly return to lying flat on the floor. Bridges Repeat these steps 10 times: 1. Lie on your back on a firm surface. 2. Bend your knees so they are pointing toward the ceiling and your feet are flat on the floor. 3. Tighten your buttocks muscles and lift your buttocks off of the floor until your waist is at almost the same height as your knees. You should feel the muscles working in your buttocks and the back of your thighs. If you do not feel these muscles, slide your feet 1-2 inches farther away from your buttocks. 4. Hold this position for 3-5   seconds. 5. Slowly lower your hips to the starting position, and allow your buttocks muscles to relax completely. If this exercise is too easy, try doing it with your arms crossed over your chest. Abdominal Crunches Repeat these steps 5-10 times: 1. Lie on your back on a firm bed or the floor with your legs extended. 2. Bend your knees so they are pointing toward the ceiling and your feet are flat on the floor. 3. Cross your arms over your chest. 4. Tip your chin slightly toward your chest without bending your neck. 5. Tighten your abdominal muscles and slowly raise your trunk (torso) high  enough to lift your shoulder blades a tiny bit off of the floor. Avoid raising your torso higher than that, because it can put too much stress on your low back and it does not help to strengthen your abdominal muscles. 6. Slowly return to your starting position. Back Lifts Repeat these steps 5-10 times: 1. Lie on your abdomen (face-down) with your arms at your sides, and rest your forehead on the floor. 2. Tighten the muscles in your legs and your buttocks. 3. Slowly lift your chest off of the floor while you keep your hips pressed to the floor. Keep the back of your head in line with the curve in your back. Your eyes should be looking at the floor. 4. Hold this position for 3-5 seconds. 5. Slowly return to your starting position. SEEK MEDICAL CARE IF:  Your back pain or discomfort gets much worse when you do an exercise.  Your back pain or discomfort does not lessen within 2 hours after you exercise. If you have any of these problems, stop doing these exercises right away. Do not do them again unless your health care provider says that you can. SEEK IMMEDIATE MEDICAL CARE IF:  You develop sudden, severe back pain. If this happens, stop doing the exercises right away. Do not do them again unless your health care provider says that you can.   This information is not intended to replace advice given to you by your health care provider. Make sure you discuss any questions you have with your health care provider.   Document Released: 08/24/2004 Document Revised: 04/07/2015 Document Reviewed: 09/10/2014 [employer] Interactive Patient Education 2016 [employer]  

## 2015-07-02 ENCOUNTER — Encounter (HOSPITAL_BASED_OUTPATIENT_CLINIC_OR_DEPARTMENT_OTHER): Payer: No Typology Code available for payment source | Attending: Internal Medicine

## 2015-07-02 DIAGNOSIS — E1151 Type 2 diabetes mellitus with diabetic peripheral angiopathy without gangrene: Secondary | ICD-10-CM | POA: Insufficient documentation

## 2015-07-02 DIAGNOSIS — L97512 Non-pressure chronic ulcer of other part of right foot with fat layer exposed: Secondary | ICD-10-CM | POA: Diagnosis not present

## 2015-07-02 DIAGNOSIS — E11621 Type 2 diabetes mellitus with foot ulcer: Secondary | ICD-10-CM | POA: Insufficient documentation

## 2015-07-02 DIAGNOSIS — I428 Other cardiomyopathies: Secondary | ICD-10-CM | POA: Insufficient documentation

## 2015-07-02 DIAGNOSIS — E1140 Type 2 diabetes mellitus with diabetic neuropathy, unspecified: Secondary | ICD-10-CM | POA: Insufficient documentation

## 2015-07-02 DIAGNOSIS — Z794 Long term (current) use of insulin: Secondary | ICD-10-CM | POA: Insufficient documentation

## 2015-07-02 DIAGNOSIS — I509 Heart failure, unspecified: Secondary | ICD-10-CM | POA: Insufficient documentation

## 2015-07-02 DIAGNOSIS — I110 Hypertensive heart disease with heart failure: Secondary | ICD-10-CM | POA: Diagnosis not present

## 2015-07-02 DIAGNOSIS — G4730 Sleep apnea, unspecified: Secondary | ICD-10-CM | POA: Insufficient documentation

## 2015-07-06 ENCOUNTER — Encounter (HOSPITAL_COMMUNITY): Payer: Self-pay | Admitting: Emergency Medicine

## 2015-07-06 ENCOUNTER — Emergency Department (HOSPITAL_COMMUNITY): Payer: No Typology Code available for payment source

## 2015-07-06 ENCOUNTER — Emergency Department (HOSPITAL_COMMUNITY)
Admission: EM | Admit: 2015-07-06 | Discharge: 2015-07-06 | Disposition: A | Discharge status: Home / Self Care | Payer: No Typology Code available for payment source | Attending: Emergency Medicine | Admitting: Emergency Medicine

## 2015-07-06 DIAGNOSIS — Z86010 Personal history of colonic polyps: Secondary | ICD-10-CM | POA: Insufficient documentation

## 2015-07-06 DIAGNOSIS — I493 Ventricular premature depolarization: Secondary | ICD-10-CM | POA: Insufficient documentation

## 2015-07-06 DIAGNOSIS — G4733 Obstructive sleep apnea (adult) (pediatric): Secondary | ICD-10-CM | POA: Insufficient documentation

## 2015-07-06 DIAGNOSIS — M545 Low back pain: Secondary | ICD-10-CM | POA: Insufficient documentation

## 2015-07-06 DIAGNOSIS — Z79899 Other long term (current) drug therapy: Secondary | ICD-10-CM | POA: Insufficient documentation

## 2015-07-06 DIAGNOSIS — F319 Bipolar disorder, unspecified: Secondary | ICD-10-CM | POA: Diagnosis not present

## 2015-07-06 DIAGNOSIS — E119 Type 2 diabetes mellitus without complications: Secondary | ICD-10-CM | POA: Insufficient documentation

## 2015-07-06 DIAGNOSIS — Z9889 Other specified postprocedural states: Secondary | ICD-10-CM | POA: Insufficient documentation

## 2015-07-06 DIAGNOSIS — Z8673 Personal history of transient ischemic attack (TIA), and cerebral infarction without residual deficits: Secondary | ICD-10-CM | POA: Insufficient documentation

## 2015-07-06 DIAGNOSIS — Z9981 Dependence on supplemental oxygen: Secondary | ICD-10-CM | POA: Diagnosis not present

## 2015-07-06 DIAGNOSIS — Z8619 Personal history of other infectious and parasitic diseases: Secondary | ICD-10-CM | POA: Diagnosis not present

## 2015-07-06 DIAGNOSIS — Z794 Long term (current) use of insulin: Secondary | ICD-10-CM | POA: Diagnosis not present

## 2015-07-06 DIAGNOSIS — M62838 Other muscle spasm: Secondary | ICD-10-CM | POA: Diagnosis not present

## 2015-07-06 DIAGNOSIS — M542 Cervicalgia: Secondary | ICD-10-CM | POA: Diagnosis present

## 2015-07-06 DIAGNOSIS — Z8701 Personal history of pneumonia (recurrent): Secondary | ICD-10-CM | POA: Diagnosis not present

## 2015-07-06 DIAGNOSIS — I5022 Chronic systolic (congestive) heart failure: Secondary | ICD-10-CM | POA: Diagnosis not present

## 2015-07-06 DIAGNOSIS — F209 Schizophrenia, unspecified: Secondary | ICD-10-CM | POA: Insufficient documentation

## 2015-07-06 DIAGNOSIS — I252 Old myocardial infarction: Secondary | ICD-10-CM | POA: Diagnosis not present

## 2015-07-06 MED ORDER — METHOCARBAMOL 500 MG PO TABS: 500.0000 mg | ORAL_TABLET | Freq: Two times a day (BID) | ORAL | Status: DC

## 2015-07-06 MED ORDER — HYDROCODONE-ACETAMINOPHEN 5-325 MG PO TABS
1.0000 | ORAL_TABLET | Freq: Once | ORAL | Status: AC
  Administered 2015-07-06: 1 via ORAL
  Filled 2015-07-06: qty 1

## 2015-07-06 MED ORDER — HYDROCODONE-ACETAMINOPHEN 5-325 MG PO TABS: 2.0000 | ORAL_TABLET | ORAL | Status: DC | PRN
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
## 2015-07-06 NOTE — Discharge Instructions (Signed)
Muscle Cramps and Spasms °Muscle cramps and spasms occur when a muscle or muscles tighten and you have no control over this tightening (involuntary muscle contraction). They are a common problem and can develop in any muscle. The most common place is in the calf muscles of the leg. Both muscle cramps and muscle spasms are involuntary muscle contractions, but they also have differences:  °· Muscle cramps are sporadic and painful. They may last a few seconds to a quarter of an hour. Muscle cramps are often more forceful and last longer than muscle spasms. °· Muscle spasms may or may not be painful. They may also last just a few seconds or much longer. °CAUSES  °It is uncommon for cramps or spasms to be due to a serious underlying problem. In many cases, the cause of cramps or spasms is unknown. Some common causes are:  °· Overexertion.   °· Overuse from repetitive motions (doing the same thing over and over).   °· Remaining in a certain position for a long period of time.   °· Improper preparation, form, or technique while performing a sport or activity.   °· Dehydration.   °· Injury.   °· Side effects of some medicines.   °· Abnormally low levels of the salts and ions in your blood (electrolytes), especially potassium and calcium. This could happen if you are taking water pills (diuretics) or you are pregnant.   °Some underlying medical problems can make it more likely to develop cramps or spasms. These include, but are not limited to:  °· Diabetes.   °· Parkinson disease.   °· Hormone disorders, such as thyroid problems.   °· Alcohol abuse.   °· Diseases specific to muscles, joints, and bones.   °· Blood vessel disease where not enough blood is getting to the muscles.   °HOME CARE INSTRUCTIONS  °· Stay well hydrated. Drink enough water and fluids to keep your urine clear or pale yellow. °· It may be helpful to massage, stretch, and relax the affected muscle. °· For tight or tense muscles, use a warm towel, heating  pad, or hot shower water directed to the affected area. °· If you are sore or have pain after a cramp or spasm, applying ice to the affected area may relieve discomfort. °· Put ice in a plastic bag. °· Place a towel between your skin and the bag. °· Leave the ice on for 15-20 minutes, 03-04 times a day. °· Medicines used to treat a known cause of cramps or spasms may help reduce their frequency or severity. Only take over-the-counter or prescription medicines as directed by your caregiver. °SEEK MEDICAL CARE IF:  °Your cramps or spasms get more severe, more frequent, or do not improve over time.  °MAKE SURE YOU:  °· Understand these instructions. °· Will watch your condition. °· Will get help right away if you are not doing well or get worse. °  °This information is not intended to replace advice given to you by your health care provider. Make sure you discuss any questions you have with your health care provider. °  °Document Released: 01/06/2002 Document Revised: 11/11/2012 Document Reviewed: 07/03/2012 °Elsevier Interactive Patient Education ©2016 [employer] Inc[employer] ° °Heat Therapy °Heat therapy can help ease sore, stiff, injured, and tight muscles and joints. Heat relaxes your muscles, which may help ease your pain.  °RISKS AND COMPLICATIONS °If you have any of the following conditions, do not use heat therapy unless your health care provider has approved: °· Poor circulation. °· Healing wounds or scarred skin in the area being treated. °·   Diabetes, heart disease, or high blood pressure.  Not being able to feel (numbness) the area being treated.  Unusual swelling of the area being treated.  Active infections.  Blood clots.  Cancer.  Inability to communicate pain. This may include young children and people who have problems with their brain function (dementia).  Pregnancy. Heat therapy should only be used on old, pre-existing, or long-lasting (chronic) injuries. Do not use heat therapy on new injuries  unless directed by your health care provider. HOW TO USE HEAT THERAPY There are several different kinds of heat therapy, including:  Moist heat pack.  Warm water bath.  Hot water bottle.  Electric heating pad.  Heated gel pack.  Heated wrap.  Electric heating pad. Use the heat therapy method suggested by your health care provider. Follow your health care provider's instructions on when and how to use heat therapy. GENERAL HEAT THERAPY RECOMMENDATIONS  Do not sleep while using heat therapy. Only use heat therapy while you are awake.  Your skin may turn pink while using heat therapy. Do not use heat therapy if your skin turns red.  Do not use heat therapy if you have new pain.  High heat or long exposure to heat can cause burns. Be careful when using heat therapy to avoid burning your skin.  Do not use heat therapy on areas of your skin that are already irritated, such as with a rash or sunburn. SEEK MEDICAL CARE IF:  You have blisters, redness, swelling, or numbness.  You have new pain.  Your pain is worse. MAKE SURE YOU:  Understand these instructions.  Will watch your condition.  Will get help right away if you are not doing well or get worse.   This information is not intended to replace advice given to you by your health care provider. Make sure you discuss any questions you have with your health care provider.  Follow up with orthopedics for re-evaluation. Return to the Emergency Department if you experience worsening of your symptoms, bowel/bladder incontinence, numbness/tingling in both extremities.
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
## 2015-07-09 DIAGNOSIS — E11621 Type 2 diabetes mellitus with foot ulcer: Secondary | ICD-10-CM | POA: Diagnosis not present

## 2015-07-23 DIAGNOSIS — E11621 Type 2 diabetes mellitus with foot ulcer: Secondary | ICD-10-CM | POA: Diagnosis not present
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
## 2015-07-30 DIAGNOSIS — E11621 Type 2 diabetes mellitus with foot ulcer: Secondary | ICD-10-CM | POA: Diagnosis not present

## 2015-08-06 ENCOUNTER — Encounter (HOSPITAL_BASED_OUTPATIENT_CLINIC_OR_DEPARTMENT_OTHER): Payer: PRIVATE HEALTH INSURANCE | Attending: Internal Medicine

## 2015-08-06 DIAGNOSIS — I428 Other cardiomyopathies: Secondary | ICD-10-CM | POA: Diagnosis not present

## 2015-08-06 DIAGNOSIS — I5022 Chronic systolic (congestive) heart failure: Secondary | ICD-10-CM | POA: Insufficient documentation

## 2015-08-06 DIAGNOSIS — E11621 Type 2 diabetes mellitus with foot ulcer: Secondary | ICD-10-CM | POA: Insufficient documentation

## 2015-08-06 DIAGNOSIS — L97511 Non-pressure chronic ulcer of other part of right foot limited to breakdown of skin: Secondary | ICD-10-CM | POA: Insufficient documentation

## 2015-08-06 DIAGNOSIS — I110 Hypertensive heart disease with heart failure: Secondary | ICD-10-CM | POA: Insufficient documentation

## 2015-08-13 ENCOUNTER — Ambulatory Visit: Payer: PRIVATE HEALTH INSURANCE | Admitting: Cardiology

## 2015-08-13 DIAGNOSIS — E11621 Type 2 diabetes mellitus with foot ulcer: Secondary | ICD-10-CM | POA: Diagnosis not present

## 2015-08-17 ENCOUNTER — Encounter (HOSPITAL_COMMUNITY): Payer: Self-pay | Admitting: Dentistry

## 2015-08-18 ENCOUNTER — Encounter: Payer: Self-pay | Admitting: Cardiology

## 2015-09-02 ENCOUNTER — Encounter (HOSPITAL_BASED_OUTPATIENT_CLINIC_OR_DEPARTMENT_OTHER): Payer: No Typology Code available for payment source | Attending: Internal Medicine

## 2015-09-02 DIAGNOSIS — I5022 Chronic systolic (congestive) heart failure: Secondary | ICD-10-CM | POA: Insufficient documentation

## 2015-09-02 DIAGNOSIS — I428 Other cardiomyopathies: Secondary | ICD-10-CM | POA: Diagnosis not present

## 2015-09-02 DIAGNOSIS — L97511 Non-pressure chronic ulcer of other part of right foot limited to breakdown of skin: Secondary | ICD-10-CM | POA: Diagnosis not present

## 2015-09-02 DIAGNOSIS — E11621 Type 2 diabetes mellitus with foot ulcer: Secondary | ICD-10-CM | POA: Insufficient documentation

## 2015-09-02 DIAGNOSIS — G4730 Sleep apnea, unspecified: Secondary | ICD-10-CM | POA: Diagnosis not present

## 2015-09-02 DIAGNOSIS — Z794 Long term (current) use of insulin: Secondary | ICD-10-CM | POA: Diagnosis not present

## 2015-09-02 DIAGNOSIS — I110 Hypertensive heart disease with heart failure: Secondary | ICD-10-CM | POA: Diagnosis not present

## 2015-09-10 DIAGNOSIS — E11621 Type 2 diabetes mellitus with foot ulcer: Secondary | ICD-10-CM | POA: Diagnosis not present

## 2015-09-17 DIAGNOSIS — E11621 Type 2 diabetes mellitus with foot ulcer: Secondary | ICD-10-CM | POA: Diagnosis not present

## 2015-10-29 ENCOUNTER — Encounter (HOSPITAL_BASED_OUTPATIENT_CLINIC_OR_DEPARTMENT_OTHER): Payer: Self-pay | Admitting: Emergency Medicine

## 2015-10-29 DIAGNOSIS — Z79899 Other long term (current) drug therapy: Secondary | ICD-10-CM | POA: Diagnosis not present

## 2015-10-29 DIAGNOSIS — Z8673 Personal history of transient ischemic attack (TIA), and cerebral infarction without residual deficits: Secondary | ICD-10-CM | POA: Insufficient documentation

## 2015-10-29 DIAGNOSIS — Z9889 Other specified postprocedural states: Secondary | ICD-10-CM | POA: Diagnosis not present

## 2015-10-29 DIAGNOSIS — I252 Old myocardial infarction: Secondary | ICD-10-CM | POA: Insufficient documentation

## 2015-10-29 DIAGNOSIS — Z794 Long term (current) use of insulin: Secondary | ICD-10-CM | POA: Diagnosis not present

## 2015-10-29 DIAGNOSIS — I509 Heart failure, unspecified: Secondary | ICD-10-CM | POA: Insufficient documentation

## 2015-10-29 DIAGNOSIS — I10 Essential (primary) hypertension: Secondary | ICD-10-CM | POA: Diagnosis not present

## 2015-10-29 DIAGNOSIS — R0602 Shortness of breath: Secondary | ICD-10-CM | POA: Diagnosis present

## 2015-10-29 DIAGNOSIS — F319 Bipolar disorder, unspecified: Secondary | ICD-10-CM | POA: Diagnosis not present

## 2015-10-29 DIAGNOSIS — E119 Type 2 diabetes mellitus without complications: Secondary | ICD-10-CM | POA: Insufficient documentation

## 2015-10-29 DIAGNOSIS — E669 Obesity, unspecified: Secondary | ICD-10-CM | POA: Insufficient documentation

## 2015-10-29 DIAGNOSIS — Z8701 Personal history of pneumonia (recurrent): Secondary | ICD-10-CM | POA: Diagnosis not present

## 2015-10-29 DIAGNOSIS — F209 Schizophrenia, unspecified: Secondary | ICD-10-CM | POA: Insufficient documentation

## 2015-10-29 DIAGNOSIS — G4730 Sleep apnea, unspecified: Secondary | ICD-10-CM | POA: Diagnosis not present

## 2015-10-29 DIAGNOSIS — Z8619 Personal history of other infectious and parasitic diseases: Secondary | ICD-10-CM | POA: Diagnosis not present

## 2015-10-29 DIAGNOSIS — Z86010 Personal history of colonic polyps: Secondary | ICD-10-CM | POA: Insufficient documentation

## 2015-10-29 NOTE — ED Notes (Signed)
Pt c/o of SOB for 4 days, states tonight couldn't go to bed

## 2015-10-30 ENCOUNTER — Emergency Department (HOSPITAL_BASED_OUTPATIENT_CLINIC_OR_DEPARTMENT_OTHER)
Admission: EM | Admit: 2015-10-30 | Discharge: 2015-10-30 | Disposition: A | Discharge status: Home / Self Care | Payer: No Typology Code available for payment source | Attending: Emergency Medicine | Admitting: Emergency Medicine

## 2015-10-30 ENCOUNTER — Emergency Department (HOSPITAL_BASED_OUTPATIENT_CLINIC_OR_DEPARTMENT_OTHER): Payer: No Typology Code available for payment source

## 2015-10-30 DIAGNOSIS — I509 Heart failure, unspecified: Secondary | ICD-10-CM

## 2015-10-30 LAB — BASIC METABOLIC PANEL
ANION GAP: 10 (ref 5–15)
BUN: 14 mg/dL (ref 6–20)
CALCIUM: 9 mg/dL (ref 8.9–10.3)
CO2: 25 mmol/L (ref 22–32)
Chloride: 103 mmol/L (ref 101–111)
Creatinine, Ser: 0.8 mg/dL (ref 0.61–1.24)
Glucose, Bld: 372 mg/dL — ABNORMAL HIGH (ref 65–99)
Potassium: 3.5 mmol/L (ref 3.5–5.1)
Sodium: 138 mmol/L (ref 135–145)

## 2015-10-30 LAB — CBC WITH DIFFERENTIAL/PLATELET
BASOS PCT: 0 %
Basophils Absolute: 0 10*3/uL (ref 0.0–0.1)
Eosinophils Absolute: 0.1 10*3/uL (ref 0.0–0.7)
Eosinophils Relative: 1 %
HEMATOCRIT: 36.8 % — AB (ref 39.0–52.0)
HEMOGLOBIN: 12.6 g/dL — AB (ref 13.0–17.0)
LYMPHS ABS: 1.5 10*3/uL (ref 0.7–4.0)
LYMPHS PCT: 14 %
MCH: 29.6 pg (ref 26.0–34.0)
MCHC: 34.2 g/dL (ref 30.0–36.0)
MCV: 86.6 fL (ref 78.0–100.0)
MONOS PCT: 5 %
Monocytes Absolute: 0.6 10*3/uL (ref 0.1–1.0)
NEUTROS ABS: 8.8 10*3/uL — AB (ref 1.7–7.7)
NEUTROS PCT: 80 %
Platelets: 176 10*3/uL (ref 150–400)
RBC: 4.25 MIL/uL (ref 4.22–5.81)
RDW: 12.1 % (ref 11.5–15.5)
WBC: 11.1 10*3/uL — ABNORMAL HIGH (ref 4.0–10.5)

## 2015-10-30 LAB — BRAIN NATRIURETIC PEPTIDE: B Natriuretic Peptide: 153.6 pg/mL — ABNORMAL HIGH (ref 0.0–100.0)

## 2015-10-30 LAB — TROPONIN I: Troponin I: 0.03 ng/mL (ref <0.031)

## 2015-10-30 MED ORDER — FUROSEMIDE 10 MG/ML IJ SOLN
40.0000 mg | Freq: Once | INTRAMUSCULAR | Status: AC
  Administered 2015-10-30: 40 mg via INTRAVENOUS
  Filled 2015-10-30: qty 4

## 2015-10-30 NOTE — ED Provider Notes (Signed)
CSN: [id number]     Arrival date & time 10/29/15  2344 History   First MD Initiated Contact with Patient 10/30/15 0109     Chief Complaint  Patient presents with  . Shortness of Breath     (Consider location/radiation/quality/duration/timing/severity/associated sxs/prior Treatment) HPI Comments: Patient is a 45 year old male with history of diabetes, CHF, prior MI he presents today for evaluation of shortness of breath that has worsened over the past 5 days. His symptoms are exacerbated with lying flat. He also has some dyspnea on exertion. He denies fevers, chills, chest pain. He does report slight swelling in his ankles.  Patient is a 45 y.o. male presenting with shortness of breath. The history is provided by the patient.  Shortness of Breath Severity:  Moderate Onset quality:  Gradual Duration:  5 days Timing:  Constant Progression:  Worsening Chronicity:  Recurrent Context comment:  Lying flat Relieved by:  Nothing Exacerbated by: Lying flat. Ineffective treatments:  None tried Associated symptoms: no chest pain, no cough and no fever     Past Medical History  Diagnosis Date  . Hypertension   . Hyperlipidemia   . CVA (cerebral infarction)     No residual deficits  . Non-ischemic cardiomyopathy (HCC)     No ischemia on myoview, showed EF of 42%. 2D echo showed EF 50-55% with diatolic dysfunction in 2009  . Post-cardiac injury syndrome (HCC)     History of cardiac injury from blunt trauma  . PVCs (premature ventricular contractions)   . History of colonic polyps   . Obesity   . Syncope     Recurrent, thought to be vasovagal. Also has h/o frequent PVCs.   . Chronic systolic heart failure (HCC) 11/2010    a. NICM 4/14 EF 20-25%, TR mild  . SOB (shortness of breath)   . 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 Headache(784.0)   . Diabetes mellitus     TYPE II; UNCONTROLLED BY HEMOGLOBIN A1c; STABLE AS  PER DISCHARGE  . CHF (congestive heart failure) (HCC)   . Myocardial  infarction [facility])     at age 56 years old (while playing football)  . Stroke [facility]) 2005    some left side weakness  . OSA (obstructive sleep apnea)     Being set up again for c-pap  . Pneumonia   . Depression     PTSD,   . Bipolar disorder (HCC)   . Schizophrenia (HCC)     Goes to [facility]   Past Surgical History  Procedure Laterality Date  . Cardiac catheterization  12/19/10    DIFFUSE NONOBSTRUCTIVE CAD; NONISCHEMIC CARDIOMYOPATHY; LEFT VENTRICULAR ANGIOGRAM WAS PERFORMED SECONDARY TO  ELEVATED LEFT VENTRICULAR FILLING PRESSURES  . Orif finger / thumb fracture Right   . Colonoscopy w/ polypectomy    . Multiple extractions with alveoloplasty  01/27/2014    "all my teeth; 4 Quadrants of alveoloplasty  . Multiple extractions with alveoloplasty N/A 01/27/2014    Procedure: EXTRACTION OF TEETH #'1, 2, 3, 4, 5, 6, 7, 8, 9, 10, 11, 12, 13, 14, 15, 16, 17, 20, 21, 22, 23, 24, 25, 26, 27, 28, 29, 31 and 32 WITH ALVEOLOPLASTY;  Surgeon: Charlynne Pander, DDS;  Location: [facility] OR;  Service: Oral Surgery;  Laterality: N/A;   Family History  Problem Relation Age of Onset  . Heart disease Mother     MI  . Heart failure Mother   . Diabetes Mother     ALSO IN  MOST OF HIS SIBLINGS; 2 UNLCES HAVE ALSO PASSED AWAY FROM DM  . Cardiomyopathy Mother   . Cancer - Ovarian Mother   . Heart disease Father   . Hypertension Father   . Diabetes Father    Social History  Substance Use Topics  . Smoking status: Never Smoker   . Smokeless tobacco: Never Used  . Alcohol Use: No    Review of Systems  Constitutional: Negative for fever.  Respiratory: Positive for shortness of breath. Negative for cough.   Cardiovascular: Negative for chest pain.  All other systems reviewed and are negative.     Allergies  Nsaids  Home Medications   Prior to Admission medications   Medication Sig Start Date End Date Taking? Authorizing Provider  acetaminophen (TYLENOL) 500 MG tablet Take 1  tablet (500 mg total) by mouth every 6 (six) hours as needed. 06/21/15   Kayla Rose, PA-C  amLODipine (NORVASC) 5 MG tablet Take 5 mg by mouth daily.    Historical Provider, MD  carvedilol (COREG) 25 MG tablet Take 1 tablet (25 mg total) by mouth 2 (two) times daily with a meal. 12/09/14   John Molpus, MD  cyclobenzaprine (FLEXERIL) 5 MG tablet Take 1 tablet (5 mg total) by mouth 3 (three) times daily as needed for muscle spasms. 06/21/15   Cheri Fowler, PA-C  digoxin (LANOXIN) 0.25 MG tablet Take 1 tablet (0.25 mg total) by mouth daily. 12/09/14   John Molpus, MD  furosemide (LASIX) 40 MG tablet Take 1 tablet (40 mg total) by mouth 2 (two) times daily. 12/09/14   John Molpus, MD  HYDROcodone-acetaminophen (NORCO/VICODIN) 5-325 MG per tablet Take 1-2 tablets by mouth every 6 (six) hours as needed. 01/16/15   Ladona Mow, PA-C  insulin NPH-regular Human (NOVOLIN 70/30) (70-30) 100 UNIT/ML injection Inject 40 Units into the skin 2 (two) times daily with a meal. 12/09/14   Paula Libra, MD  insulin NPH-regular Human (NOVOLIN 70/30) (70-30) 100 UNIT/ML injection Inject 40 Units into the skin 2 (two) times daily with a meal. 01/24/15   April Palumbo, MD  methocarbamol (ROBAXIN) 500 MG tablet Take 1 tablet (500 mg total) by mouth 2 (two) times daily. 07/06/15   Samantha Tripp Dowless, PA-C  naproxen sodium (ANAPROX) 220 MG tablet Take 440 mg by mouth 2 (two) times daily as needed (pain).    Historical Provider, MD  QUEtiapine (SEROQUEL) 100 MG tablet Take 1 tablet (100 mg total) by mouth at bedtime. Patient taking differently: Take 150 mg by mouth at bedtime.  11/25/13   Renae Fickle, MD  spironolactone (ALDACTONE) 50 MG tablet Take 1 tablet (50 mg total) by mouth daily. 12/09/14 12/09/15  John Molpus, MD   BP 158/107 mmHg  Pulse 113  Temp(Src) 97.5 F (36.4 C) (Oral)  Resp 18  Ht 5' 11.5" (1.816 m)  Wt 272 lb (123.378 kg)  BMI 37.41 kg/m2  SpO2 100% Physical Exam  Constitutional: He is oriented to person,  place, and time. He appears well-developed and well-nourished. No distress.  HENT:  Head: Normocephalic and atraumatic.  Neck: Normal range of motion. Neck supple.  Cardiovascular: Normal rate, regular rhythm and normal heart sounds.   No murmur heard. Pulmonary/Chest: Effort normal. No respiratory distress. He has no wheezes. He has rales.  Slight rales in the bases bilaterally.  Abdominal: Soft. Bowel sounds are normal.  Musculoskeletal:  1+ pitting edema of both lower extremities.  Neurological: He is alert and oriented to person, place, and time.  Skin: Skin  is warm and dry. He is not diaphoretic.  Nursing note and vitals reviewed.   ED Course  Procedures (including critical care time) Labs Review Labs Reviewed  BASIC METABOLIC PANEL  CBC WITH DIFFERENTIAL/PLATELET  TROPONIN I  BRAIN NATRIURETIC PEPTIDE    Imaging Review No results found. I have personally reviewed and evaluated these images and lab results as part of my medical decision-making.  ED ECG REPORT   Date: 10/30/2015  Rate: 104  Rhythm: sinus tachycardia  QRS Axis: left  Intervals: normal  ST/T Wave abnormalities: normal  Conduction Disutrbances:Poor R-wave progression  Narrative Interpretation:   Old EKG Reviewed: none available  I have personally reviewed the EKG tracing and agree with the computerized printout as noted.   MDM   Final diagnoses:  None    Workup reveals no significant abnormalities. His EKG and troponin are not suggestive of an acute cardiac event. He does have slight edema to his legs, however did have a good diuresis with IV Lasix in the ER. He will be discharged with an increased dose of Lasix at home for the next several days and follow-up with primary Dr. in the next week.    Geoffery Lyons, MD 10/30/15 [phone]

## 2015-10-30 NOTE — Discharge Instructions (Signed)
Increase your Lasix to 80 mg twice daily for the next 2 days, then resume your normal dose.  Follow-up with your primary Dr. in the next week.   Heart Failure Heart failure is a condition in which the heart has trouble pumping blood. This means your heart does not pump blood efficiently for your body to work well. In some cases of heart failure, fluid may back up into your lungs or you may have swelling (edema) in your lower legs. Heart failure is usually a long-term (chronic) condition. It is important for you to take good care of yourself and follow your health care provider's treatment plan. CAUSES  Some health conditions can cause heart failure. Those health conditions include:  High blood pressure (hypertension). Hypertension causes the heart muscle to work harder than normal. When pressure in the blood vessels is high, the heart needs to pump (contract) with more force in order to circulate blood throughout the body. High blood pressure eventually causes the heart to become stiff and weak.  Coronary artery disease (CAD). CAD is the buildup of cholesterol and fat (plaque) in the arteries of the heart. The blockage in the arteries deprives the heart muscle of oxygen and blood. This can cause chest pain and may lead to a heart attack. High blood pressure can also contribute to CAD.  Heart attack (myocardial infarction). A heart attack occurs when one or more arteries in the heart become blocked. The loss of oxygen damages the muscle tissue of the heart. When this happens, part of the heart muscle dies. The injured tissue does not contract as well and weakens the heart's ability to pump blood.  Abnormal heart valves. When the heart valves do not open and close properly, it can cause heart failure. This makes the heart muscle pump harder to keep the blood flowing.  Heart muscle disease (cardiomyopathy or myocarditis). Heart muscle disease is damage to the heart muscle from a variety of causes.  These can include drug or alcohol abuse, infections, or unknown reasons. These can increase the risk of heart failure.  Lung disease. Lung disease makes the heart work harder because the lungs do not work properly. This can cause a strain on the heart, leading it to fail.  Diabetes. Diabetes increases the risk of heart failure. High blood sugar contributes to high fat (lipid) levels in the blood. Diabetes can also cause slow damage to tiny blood vessels that carry important nutrients to the heart muscle. When the heart does not get enough oxygen and food, it can cause the heart to become weak and stiff. This leads to a heart that does not contract efficiently.  Other conditions can contribute to heart failure. These include abnormal heart rhythms, thyroid problems, and low blood counts (anemia). Certain unhealthy behaviors can increase the risk of heart failure, including:  Being overweight.  Smoking or chewing tobacco.  Eating foods high in fat and cholesterol.  Abusing illicit drugs or alcohol.  Lacking physical activity. SYMPTOMS  Heart failure symptoms may vary and can be hard to detect. Symptoms may include:  Shortness of breath with activity, such as climbing stairs.  Persistent cough.  Swelling of the feet, ankles, legs, or abdomen.  Unexplained weight gain.  Difficulty breathing when lying flat (orthopnea).  Waking from sleep because of the need to sit up and get more air.  Rapid heartbeat.  Fatigue and loss of energy.  Feeling light-headed, dizzy, or close to fainting.  Loss of appetite.  Nausea.  Increased urination  during the night (nocturia). DIAGNOSIS  A diagnosis of heart failure is based on your history, symptoms, physical examination, and diagnostic tests. Diagnostic tests for heart failure may include:  Echocardiography.  Electrocardiography.  Chest X-ray.  Blood tests.  Exercise stress test.  Cardiac angiography.  Radionuclide  scans. TREATMENT  Treatment is aimed at managing the symptoms of heart failure. Medicines, behavioral changes, or surgical intervention may be necessary to treat heart failure.  Medicines to help treat heart failure may include:  Angiotensin-converting enzyme (ACE) inhibitors. This type of medicine blocks the effects of a blood protein called angiotensin-converting enzyme. ACE inhibitors relax (dilate) the blood vessels and help lower blood pressure.  Angiotensin receptor blockers (ARBs). This type of medicine blocks the actions of a blood protein called angiotensin. Angiotensin receptor blockers dilate the blood vessels and help lower blood pressure.  Water pills (diuretics). Diuretics cause the kidneys to remove salt and water from the blood. The extra fluid is removed through urination. This loss of extra fluid lowers the volume of blood the heart pumps.  Beta blockers. These prevent the heart from beating too fast and improve heart muscle strength.  Digitalis. This increases the force of the heartbeat.  Healthy behavior changes include:  Obtaining and maintaining a healthy weight.  Stopping smoking or chewing tobacco.  Eating heart-healthy foods.  Limiting or avoiding alcohol.  Stopping illicit drug use.  Physical activity as directed by your health care provider.  Surgical treatment for heart failure may include:  A procedure to open blocked arteries, repair damaged heart valves, or remove damaged heart muscle tissue.  A pacemaker to improve heart muscle function and control certain abnormal heart rhythms.  An internal cardioverter defibrillator to treat certain serious abnormal heart rhythms.  A left ventricular assist device (LVAD) to assist the pumping ability of the heart. HOME CARE INSTRUCTIONS   Take medicines only as directed by your health care provider. Medicines are important in reducing the workload of your heart, slowing the progression of heart failure, and  improving your symptoms.  Do not stop taking your medicine unless directed by your health care provider.  Do not skip any dose of medicine.  Refill your prescriptions before you run out of medicine. Your medicines are needed every day.  Engage in moderate physical activity if directed by your health care provider. Moderate physical activity can benefit some people. The elderly and people with severe heart failure should consult with a health care provider for physical activity recommendations.  Eat heart-healthy foods. Food choices should be free of trans fat and low in saturated fat, cholesterol, and salt (sodium). Healthy choices include fresh or frozen fruits and vegetables, fish, lean meats, legumes, fat-free or low-fat dairy products, and whole grain or high fiber foods. Talk to a dietitian to learn more about heart-healthy foods.  Limit sodium if directed by your health care provider. Sodium restriction may reduce symptoms of heart failure in some people. Talk to a dietitian to learn more about heart-healthy seasonings.  Use healthy cooking methods. Healthy cooking methods include roasting, grilling, broiling, baking, poaching, steaming, or stir-frying. Talk to a dietitian to learn more about healthy cooking methods.  Limit fluids if directed by your health care provider. Fluid restriction may reduce symptoms of heart failure in some people.  Weigh yourself every day. Daily weights are important in the early recognition of excess fluid. You should weigh yourself every morning after you urinate and before you eat breakfast. Wear the same amount of clothing each  time you weigh yourself. Record your daily weight. Provide your health care provider with your weight record.  Monitor and record your blood pressure if directed by your health care provider.  Check your pulse if directed by your health care provider.  Lose weight if directed by your health care provider. Weight loss may reduce  symptoms of heart failure in some people.  Stop smoking or chewing tobacco. Nicotine makes your heart work harder by causing your blood vessels to constrict. Do not use nicotine gum or patches before talking to your health care provider.  Keep all follow-up visits as directed by your health care provider. This is important.  Limit alcohol intake to no more than 1 drink per day for nonpregnant women and 2 drinks per day for men. One drink equals 12 ounces of beer, 5 ounces of wine, or 1 ounces of hard liquor. Drinking more than that is harmful to your heart. Tell your health care provider if you drink alcohol several times a week. Talk with your health care provider about whether alcohol is safe for you. If your heart has already been damaged by alcohol or you have severe heart failure, drinking alcohol should be stopped completely.  Stop illicit drug use.  Stay up-to-date with immunizations. It is especially important to prevent respiratory infections through current pneumococcal and influenza immunizations.  Manage other health conditions such as hypertension, diabetes, thyroid disease, or abnormal heart rhythms as directed by your health care provider.  Learn to manage stress.  Plan rest periods when fatigued.  Learn strategies to manage high temperatures. If the weather is extremely hot:  Avoid vigorous physical activity.  Use air conditioning or fans or seek a cooler location.  Avoid caffeine and alcohol.  Wear loose-fitting, lightweight, and light-colored clothing.  Learn strategies to manage cold temperatures. If the weather is extremely cold:  Avoid vigorous physical activity.  Layer clothes.  Wear mittens or gloves, a hat, and a scarf when going outside.  Avoid alcohol.  Obtain ongoing education and support as needed.  Participate in or seek rehabilitation as needed to maintain or improve independence and quality of life. SEEK MEDICAL CARE IF:   You have a rapid  weight gain.  You have increasing shortness of breath that is unusual for you.  You are unable to participate in your usual physical activities.  You tire easily.  You cough more than normal, especially with physical activity.  You have any or more swelling in areas such as your hands, feet, ankles, or abdomen.  You are unable to sleep because it is hard to breathe.  You feel like your heart is beating fast (palpitations).  You become dizzy or light-headed upon standing up. SEEK IMMEDIATE MEDICAL CARE IF:   You have difficulty breathing.  There is a change in mental status such as decreased alertness or difficulty with concentration.  You have a pain or discomfort in your chest.  You have an episode of fainting (syncope). MAKE SURE YOU:   Understand these instructions.  Will watch your condition.  Will get help right away if you are not doing well or get worse.   This information is not intended to replace advice given to you by your health care provider. Make sure you discuss any questions you have with your health care provider.   Document Released: 07/17/2005 Document Revised: 12/01/2014 Document Reviewed: 08/16/2012 Elsevier Interactive Patient Education [employer].

## 2015-10-30 NOTE — ED Notes (Signed)
Went to room to do EKG, but pt unavailable at this time.

## 2015-12-06 ENCOUNTER — Emergency Department (HOSPITAL_COMMUNITY): Payer: No Typology Code available for payment source

## 2015-12-06 ENCOUNTER — Encounter (HOSPITAL_COMMUNITY): Payer: Self-pay | Admitting: *Deleted

## 2015-12-06 ENCOUNTER — Inpatient Hospital Stay (HOSPITAL_COMMUNITY)
Admission: EM | Admit: 2015-12-06 | Discharge: 2015-12-08 | DRG: 293 | Disposition: A | Discharge status: Home / Self Care | Payer: No Typology Code available for payment source | Attending: Internal Medicine | Admitting: Internal Medicine

## 2015-12-06 DIAGNOSIS — I110 Hypertensive heart disease with heart failure: Secondary | ICD-10-CM | POA: Diagnosis not present

## 2015-12-06 DIAGNOSIS — Z886 Allergy status to analgesic agent status: Secondary | ICD-10-CM

## 2015-12-06 DIAGNOSIS — E785 Hyperlipidemia, unspecified: Secondary | ICD-10-CM | POA: Diagnosis present

## 2015-12-06 DIAGNOSIS — F319 Bipolar disorder, unspecified: Secondary | ICD-10-CM | POA: Diagnosis present

## 2015-12-06 DIAGNOSIS — Z79899 Other long term (current) drug therapy: Secondary | ICD-10-CM

## 2015-12-06 DIAGNOSIS — Z794 Long term (current) use of insulin: Secondary | ICD-10-CM

## 2015-12-06 DIAGNOSIS — G4733 Obstructive sleep apnea (adult) (pediatric): Secondary | ICD-10-CM | POA: Diagnosis present

## 2015-12-06 DIAGNOSIS — Z8673 Personal history of transient ischemic attack (TIA), and cerebral infarction without residual deficits: Secondary | ICD-10-CM

## 2015-12-06 DIAGNOSIS — E1165 Type 2 diabetes mellitus with hyperglycemia: Secondary | ICD-10-CM | POA: Diagnosis present

## 2015-12-06 DIAGNOSIS — R05 Cough: Secondary | ICD-10-CM

## 2015-12-06 DIAGNOSIS — I428 Other cardiomyopathies: Secondary | ICD-10-CM | POA: Diagnosis present

## 2015-12-06 DIAGNOSIS — R55 Syncope and collapse: Secondary | ICD-10-CM | POA: Diagnosis present

## 2015-12-06 DIAGNOSIS — R0602 Shortness of breath: Secondary | ICD-10-CM | POA: Diagnosis not present

## 2015-12-06 DIAGNOSIS — I252 Old myocardial infarction: Secondary | ICD-10-CM

## 2015-12-06 DIAGNOSIS — I5023 Acute on chronic systolic (congestive) heart failure: Secondary | ICD-10-CM | POA: Diagnosis present

## 2015-12-06 DIAGNOSIS — F209 Schizophrenia, unspecified: Secondary | ICD-10-CM | POA: Diagnosis present

## 2015-12-06 DIAGNOSIS — Z9119 Patient's noncompliance with other medical treatment and regimen: Secondary | ICD-10-CM

## 2015-12-06 DIAGNOSIS — I509 Heart failure, unspecified: Secondary | ICD-10-CM

## 2015-12-06 DIAGNOSIS — I10 Essential (primary) hypertension: Secondary | ICD-10-CM | POA: Diagnosis present

## 2015-12-06 LAB — CBC
HEMATOCRIT: 33.8 % — AB (ref 39.0–52.0)
HEMOGLOBIN: 11 g/dL — AB (ref 13.0–17.0)
MCH: 29.1 pg (ref 26.0–34.0)
MCHC: 32.5 g/dL (ref 30.0–36.0)
MCV: 89.4 fL (ref 78.0–100.0)
Platelets: 159 10*3/uL (ref 150–400)
RBC: 3.78 MIL/uL — AB (ref 4.22–5.81)
RDW: 13 % (ref 11.5–15.5)
WBC: 8.3 10*3/uL (ref 4.0–10.5)

## 2015-12-06 LAB — BASIC METABOLIC PANEL
ANION GAP: 9 (ref 5–15)
BUN: 8 mg/dL (ref 6–20)
CHLORIDE: 104 mmol/L (ref 101–111)
CO2: 26 mmol/L (ref 22–32)
Calcium: 8.7 mg/dL — ABNORMAL LOW (ref 8.9–10.3)
Creatinine, Ser: 0.81 mg/dL (ref 0.61–1.24)
GFR calc non Af Amer: >60 mL/min (ref >60)
Glucose, Bld: 236 mg/dL — ABNORMAL HIGH (ref 65–99)
POTASSIUM: 3.3 mmol/L — AB (ref 3.5–5.1)
SODIUM: 139 mmol/L (ref 135–145)

## 2015-12-06 LAB — DIGOXIN LEVEL: DIGOXIN LVL: 0.2 ng/mL — AB (ref 0.8–2.0)

## 2015-12-06 LAB — I-STAT TROPONIN, ED: Troponin i, poc: 0.02 ng/mL (ref 0.00–0.08)

## 2015-12-06 LAB — BRAIN NATRIURETIC PEPTIDE: B NATRIURETIC PEPTIDE 5: 368.4 pg/mL — AB (ref 0.0–100.0)

## 2015-12-06 MED ORDER — POTASSIUM CHLORIDE CRYS ER 20 MEQ PO TBCR
40.0000 meq | EXTENDED_RELEASE_TABLET | Freq: Once | ORAL | Status: DC
  Filled 2015-12-06 (×2): qty 2

## 2015-12-06 MED ORDER — FUROSEMIDE 10 MG/ML IJ SOLN
40.0000 mg | Freq: Once | INTRAMUSCULAR | Status: AC
  Administered 2015-12-06: 40 mg via INTRAVENOUS
  Filled 2015-12-06: qty 4

## 2015-12-06 NOTE — ED Notes (Signed)
Per EMS pt ran up on the curb into a bush during a coughing episode. Per pt he remembers seeing the neighbor but does not recall anything after that.

## 2015-12-06 NOTE — ED Notes (Signed)
Patient transported to X-ray 
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
## 2015-12-06 NOTE — ED Notes (Signed)
Per EMS- pt from home, c/o SOB and Left sided chest pain x 3 days. Nitro x 1 and 324mg  ASA given PTA. Pt states that he has coughing spells and have difficulty catching his breathe. 94% RA. Pt placed on 2L [place] for comfort.

## 2015-12-07 ENCOUNTER — Encounter (HOSPITAL_COMMUNITY): Payer: Self-pay | Admitting: Internal Medicine

## 2015-12-07 DIAGNOSIS — E1165 Type 2 diabetes mellitus with hyperglycemia: Secondary | ICD-10-CM

## 2015-12-07 DIAGNOSIS — I110 Hypertensive heart disease with heart failure: Secondary | ICD-10-CM | POA: Diagnosis present

## 2015-12-07 DIAGNOSIS — F319 Bipolar disorder, unspecified: Secondary | ICD-10-CM | POA: Diagnosis present

## 2015-12-07 DIAGNOSIS — Z8673 Personal history of transient ischemic attack (TIA), and cerebral infarction without residual deficits: Secondary | ICD-10-CM | POA: Diagnosis not present

## 2015-12-07 DIAGNOSIS — Z79899 Other long term (current) drug therapy: Secondary | ICD-10-CM | POA: Diagnosis not present

## 2015-12-07 DIAGNOSIS — Z9119 Patient's noncompliance with other medical treatment and regimen: Secondary | ICD-10-CM | POA: Diagnosis not present

## 2015-12-07 DIAGNOSIS — R0602 Shortness of breath: Secondary | ICD-10-CM | POA: Diagnosis present

## 2015-12-07 DIAGNOSIS — I5023 Acute on chronic systolic (congestive) heart failure: Secondary | ICD-10-CM | POA: Diagnosis not present

## 2015-12-07 DIAGNOSIS — R55 Syncope and collapse: Secondary | ICD-10-CM | POA: Diagnosis present

## 2015-12-07 DIAGNOSIS — I252 Old myocardial infarction: Secondary | ICD-10-CM | POA: Diagnosis not present

## 2015-12-07 DIAGNOSIS — I428 Other cardiomyopathies: Secondary | ICD-10-CM | POA: Diagnosis present

## 2015-12-07 DIAGNOSIS — E785 Hyperlipidemia, unspecified: Secondary | ICD-10-CM | POA: Diagnosis present

## 2015-12-07 DIAGNOSIS — I10 Essential (primary) hypertension: Secondary | ICD-10-CM

## 2015-12-07 DIAGNOSIS — G4733 Obstructive sleep apnea (adult) (pediatric): Secondary | ICD-10-CM | POA: Diagnosis present

## 2015-12-07 DIAGNOSIS — Z886 Allergy status to analgesic agent status: Secondary | ICD-10-CM | POA: Diagnosis not present

## 2015-12-07 DIAGNOSIS — Z794 Long term (current) use of insulin: Secondary | ICD-10-CM | POA: Diagnosis not present

## 2015-12-07 DIAGNOSIS — F209 Schizophrenia, unspecified: Secondary | ICD-10-CM | POA: Diagnosis present

## 2015-12-07 LAB — BASIC METABOLIC PANEL
ANION GAP: 11 (ref 5–15)
BUN: 8 mg/dL (ref 6–20)
CALCIUM: 8.7 mg/dL — AB (ref 8.9–10.3)
CO2: 28 mmol/L (ref 22–32)
Chloride: 101 mmol/L (ref 101–111)
Creatinine, Ser: 0.76 mg/dL (ref 0.61–1.24)
Glucose, Bld: 268 mg/dL — ABNORMAL HIGH (ref 65–99)
Potassium: 3.2 mmol/L — ABNORMAL LOW (ref 3.5–5.1)
SODIUM: 140 mmol/L (ref 135–145)

## 2015-12-07 LAB — TROPONIN I: Troponin I: 0.03 ng/mL (ref <0.031)

## 2015-12-07 LAB — GLUCOSE, CAPILLARY
GLUCOSE-CAPILLARY: 274 mg/dL — AB (ref 65–99)
Glucose-Capillary: 138 mg/dL — ABNORMAL HIGH (ref 65–99)
Glucose-Capillary: 208 mg/dL — ABNORMAL HIGH (ref 65–99)
Glucose-Capillary: 194 mg/dL — ABNORMAL HIGH (ref 65–99)
Glucose-Capillary: 227 mg/dL — ABNORMAL HIGH (ref 65–99)

## 2015-12-07 LAB — CREATININE, SERUM: CREATININE: 0.76 mg/dL (ref 0.61–1.24)

## 2015-12-07 LAB — CBC
HCT: 34.1 % — ABNORMAL LOW (ref 39.0–52.0)
HEMOGLOBIN: 11 g/dL — AB (ref 13.0–17.0)
MCH: 28.3 pg (ref 26.0–34.0)
MCHC: 32.3 g/dL (ref 30.0–36.0)
MCV: 87.7 fL (ref 78.0–100.0)
PLATELETS: 169 10*3/uL (ref 150–400)
RBC: 3.89 MIL/uL — AB (ref 4.22–5.81)
RDW: 12.8 % (ref 11.5–15.5)
WBC: 8.9 10*3/uL (ref 4.0–10.5)

## 2015-12-07 LAB — MAGNESIUM: MAGNESIUM: 1.5 mg/dL — AB (ref 1.7–2.4)

## 2015-12-07 MED ORDER — POTASSIUM CHLORIDE 20 MEQ PO PACK: 40.0000 meq | PACK | Freq: Once | ORAL | Status: DC

## 2015-12-07 MED ORDER — ACETAMINOPHEN 650 MG RE SUPP: 650.0000 mg | Freq: Four times a day (QID) | RECTAL | Status: DC | PRN

## 2015-12-07 MED ORDER — SPIRONOLACTONE 25 MG PO TABS
50.0000 mg | ORAL_TABLET | Freq: Every day | ORAL | Status: DC
  Administered 2015-12-07 – 2015-12-08 (×2): 50 mg via ORAL
  Filled 2015-12-07 (×2): qty 2

## 2015-12-07 MED ORDER — CARVEDILOL 25 MG PO TABS
25.0000 mg | ORAL_TABLET | Freq: Two times a day (BID) | ORAL | Status: DC
  Administered 2015-12-07 – 2015-12-08 (×3): 25 mg via ORAL
  Filled 2015-12-07 (×3): qty 1

## 2015-12-07 MED ORDER — ACETAMINOPHEN 325 MG PO TABS: 650.0000 mg | ORAL_TABLET | Freq: Four times a day (QID) | ORAL | Status: DC | PRN

## 2015-12-07 MED ORDER — POTASSIUM CHLORIDE 20 MEQ/15ML (10%) PO SOLN
40.0000 meq | Freq: Once | ORAL | Status: AC
  Administered 2015-12-07: 40 meq via ORAL
  Filled 2015-12-07: qty 30

## 2015-12-07 MED ORDER — POTASSIUM CHLORIDE CRYS ER 20 MEQ PO TBCR
40.0000 meq | EXTENDED_RELEASE_TABLET | Freq: Once | ORAL | Status: AC
  Administered 2015-12-07: 40 meq via ORAL
  Filled 2015-12-07: qty 2

## 2015-12-07 MED ORDER — SODIUM CHLORIDE 0.9% FLUSH
3.0000 mL | Freq: Two times a day (BID) | INTRAVENOUS | Status: DC
  Administered 2015-12-07 – 2015-12-08 (×4): 3 mL via INTRAVENOUS

## 2015-12-07 MED ORDER — DIGOXIN 125 MCG PO TABS
0.2500 mg | ORAL_TABLET | Freq: Every day | ORAL | Status: DC
  Administered 2015-12-07 – 2015-12-08 (×2): 0.25 mg via ORAL
  Filled 2015-12-07 (×2): qty 2

## 2015-12-07 MED ORDER — INSULIN ASPART 100 UNIT/ML ~~LOC~~ SOLN
0.0000 [IU] | Freq: Three times a day (TID) | SUBCUTANEOUS | Status: DC
  Administered 2015-12-07: 2 [IU] via SUBCUTANEOUS
  Administered 2015-12-07: 3 [IU] via SUBCUTANEOUS
  Administered 2015-12-07: 5 [IU] via SUBCUTANEOUS

## 2015-12-07 MED ORDER — INSULIN ASPART PROT & ASPART (70-30 MIX) 100 UNIT/ML ~~LOC~~ SUSP
40.0000 [IU] | Freq: Two times a day (BID) | SUBCUTANEOUS | Status: DC
  Administered 2015-12-07 – 2015-12-08 (×3): 40 [IU] via SUBCUTANEOUS
  Filled 2015-12-07: qty 10

## 2015-12-07 MED ORDER — QUETIAPINE FUMARATE 25 MG PO TABS
150.0000 mg | ORAL_TABLET | Freq: Every day | ORAL | Status: DC
  Administered 2015-12-07 (×2): 100 mg via ORAL
  Filled 2015-12-07 (×2): qty 6

## 2015-12-07 MED ORDER — ENOXAPARIN SODIUM 60 MG/0.6ML ~~LOC~~ SOLN
60.0000 mg | SUBCUTANEOUS | Status: DC
  Filled 2015-12-07: qty 0.6

## 2015-12-07 MED ORDER — ATORVASTATIN CALCIUM 40 MG PO TABS
40.0000 mg | ORAL_TABLET | Freq: Every evening | ORAL | Status: DC
  Administered 2015-12-07: 40 mg via ORAL
  Filled 2015-12-07: qty 1

## 2015-12-07 MED ORDER — ONDANSETRON HCL 4 MG/2ML IJ SOLN: 4.0000 mg | Freq: Four times a day (QID) | INTRAMUSCULAR | Status: DC | PRN

## 2015-12-07 MED ORDER — FUROSEMIDE 10 MG/ML IJ SOLN
40.0000 mg | Freq: Two times a day (BID) | INTRAMUSCULAR | Status: DC
  Administered 2015-12-07 – 2015-12-08 (×4): 40 mg via INTRAVENOUS
  Filled 2015-12-07 (×4): qty 4

## 2015-12-07 MED ORDER — ONDANSETRON HCL 4 MG PO TABS: 4.0000 mg | ORAL_TABLET | Freq: Four times a day (QID) | ORAL | Status: DC | PRN

## 2015-12-07 MED ORDER — MAGNESIUM SULFATE 2 GM/50ML IV SOLN
2.0000 g | Freq: Once | INTRAVENOUS | Status: AC
  Administered 2015-12-07: 2 g via INTRAVENOUS
  Filled 2015-12-07: qty 50
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
## 2015-12-07 NOTE — Progress Notes (Signed)
[facility] Progress Note                                            Patient Demographics:    Mitchell [name], is a 45 y.o. male, DOB - Jun 05, 1971, TKP:[id number]  Admit date - 12/06/2015   Admitting Physician Eduard Clos, MD  Outpatient Primary MD for the patient is Caffie Damme, MD  LOS - 0  Outpatient Specialists:  Chief Complaint  Patient presents with  . Shortness of Breath  . Chest Pain        Subjective:    Mitchell [name]. Persistent dyspnea but has improved, no chest pain or palpitations, no nausea or vomiting.,   Assessment  & Plan :    Principal Problem:   Acute on chronic systolic heart failure (HCC) Active Problems:   Obstructive sleep apnea   Essential hypertension   CHF (congestive heart failure) (HCC)   Type 2 diabetes mellitus with hyperglycemia (HCC)   1. Cardiovascular. Will continue diuresis with furosemide, keep negative fluid balance, monitor in and out. Continue carvedilol and dogoxin, hold on ace inh due to risk of hypotension or renal failure, follow on echocardiogram.  2, Pulmonary,. Cardiogenic pulmonary edema, will continue diuresis with furosemide, target negative fluid balance, continue to monitor oxymetry and supplemental 02 per [place].  3, Nephrology. Follow on renal panel in am, avoid hypotension or nephrotoxic meds. Cr at 0.76, replete K with po kcl. Replete magnesium with mag sulfate.  4. Neurology, will contionue seroquel. No agitation or confusion.   5, Endocrinology, Will continue to monitor serum glucose and cover with insulin.    Code Status :   Family Communication  :  Disposition Plan  :   Barriers For Discharge :    Consults  :    Procedures  :  DVT Prophylaxis  :  Lovenox -  Lab Results  Component Value Date   PLT 169 12/07/2015    Antibiotics  :    Anti-infectives    None        Objective:   Filed Vitals:   12/07/15 0042 12/07/15 0500 12/07/15 0843 12/07/15 1140  BP:   148/88 116/68  Pulse:   92 81  Temp:   98.3 F (36.8 C)   TempSrc:   Oral   Resp:   20 16  Height: 5\' 11"  (1.803 m)     Weight: 126.78 kg (279 lb 8 oz) 126.78 kg (279 lb 8 oz)    SpO2:   97% 96%    Wt Readings from Last 3 Encounters:  12/07/15 126.78 kg (279 lb 8 oz)  10/29/15 123.378 kg (272 lb)  02/02/15 123.378 kg (272 lb)     Intake/Output  Summary (Last 24 hours) at 12/07/15 1347 Last data filed at 12/07/15 1256  Gross per 24 hour  Intake    840 ml  Output   4430 ml  Net  -3590 ml     Physical Exam  Awake Alert, awake and alert. [place].AT,PERRAL Supple Neck,No JVD, No cervical lymphadenopathy appriciated.  Symmetrical Chest wall movement, bibasilar rales. No rhonchi or significant wheezing. RRR,No Gallops,Rubs or new Murmurs, No Parasternal Heave +ve B.Sounds, Abd Soft, No tenderness, No organomegaly appriciated, No rebound - guarding or rigidity. No Cyanosis, positive edema trace bilateral,  No new Rash or bruise     Data Review:    CBC  Recent Labs Lab 12/06/15 1931 12/07/15 0448  WBC 8.3 8.9  HGB 11.0* 11.0*  HCT 33.8* 34.1*  PLT 159 169  MCV 89.4 87.7  MCH 29.1 28.3  MCHC 32.5 32.3  RDW 13.0 12.8    Chemistries   Recent Labs Lab 12/06/15 1931 12/07/15 0448  NA 139 140  K 3.3* 3.2*  CL 104 101  CO2 26 28  GLUCOSE 236* 268*  BUN 8 8  CREATININE 0.81 0.76  0.76  CALCIUM 8.7* 8.7*  MG  --  1.5*   ------------------------------------------------------------------------------------------------------------------ No results for input(s): CHOL, HDL, LDLCALC, TRIG, CHOLHDL, LDLDIRECT in the last 72 hours.  Lab Results  Component Value Date   HGBA1C 14.1*  11/22/2013   ------------------------------------------------------------------------------------------------------------------ No results for input(s): TSH, T4TOTAL, T3FREE, THYROIDAB in the last 72 hours.  Invalid input(s): FREET3 ------------------------------------------------------------------------------------------------------------------ No results for input(s): VITAMINB12, FOLATE, FERRITIN, TIBC, IRON, RETICCTPCT in the last 72 hours.  Coagulation profile No results for input(s): INR, PROTIME in the last 168 hours.  No results for input(s): DDIMER in the last 72 hours.  Cardiac Enzymes  Recent Labs Lab 12/07/15 0448  TROPONINI 0.03   ------------------------------------------------------------------------------------------------------------------    Component Value Date/Time   BNP 368.4* 12/06/2015 2033    Inpatient Medications  Scheduled Meds: . atorvastatin  40 mg Oral QPM  . carvedilol  25 mg Oral BID WC  . digoxin  0.25 mg Oral Daily  . enoxaparin (LOVENOX) injection  60 mg Subcutaneous Q24H  . furosemide  40 mg Intravenous BID  . insulin aspart  0-9 Units Subcutaneous TID WC  . insulin aspart protamine- aspart  40 Units Subcutaneous BID WC  . QUEtiapine  150 mg Oral QHS  . sodium chloride flush  3 mL Intravenous Q12H  . spironolactone  50 mg Oral Daily   Continuous Infusions:  PRN Meds:.acetaminophen **OR** acetaminophen, ondansetron **OR** ondansetron (ZOFRAN) IV  Micro Results No results found for this or any previous visit (from the past 240 hour(s)).  Radiology Reports Dg Chest 2 View  12/06/2015  CLINICAL DATA:  Shortness of breath.  Left chest pain for 3 days. EXAM: CHEST  2 VIEW COMPARISON:  10/30/2015 FINDINGS: Cardiomegaly with vascular congestion and mild pulmonary edema. No effusions. No acute bony abnormality. IMPRESSION: Mild CHF. Electronically Signed   By: Charlett Nose M.D.   On: 12/06/2015 19:36    Time Spent in minutes      Coralie Keens M.D on 12/07/2015 at 1:47 PM  Between 7am to 7pm - Pager   After 7pm go to [email or web address] - password [facility]  [facility] -  Office  [phone]

## 2015-12-07 NOTE — Progress Notes (Signed)
NURSING PROGRESS NOTE  Mitchell [name] [id number] Admission Data: 12/07/2015 8:27 AM Attending Provider: Coralie Keens, * DEY:CXKGY, Paula Compton, MD Code Status: full  Mitchell [name] is a 45 y.o. male patient admitted from ED:    complaints of shortness of breath.  -No complaints of chest pain.   Cardiac Monitoring: Box #41 in place. Cardiac monitor yields:normal sinus rhythm.  Blood pressure 156/100, pulse 98, temperature 97.9 F (36.6 C), temperature source Oral, resp. rate 25, height 5\' 11"  (1.803 m), weight 126.78 kg (279 lb 8 oz), SpO2 100 %.   IV Fluids:  IV in place, occlusive dsg intact without redness, IV cath hand left, condition patent and no redness none.   Allergies:  Nsaids  Past Medical History:   has a past medical history of Hypertension; Hyperlipidemia; CVA (cerebral infarction); Non-ischemic cardiomyopathy (HCC); Post-cardiac injury syndrome (HCC); PVCs (premature ventricular contractions); History of colonic polyps; Obesity; Syncope; Chronic systolic heart failure (HCC) (07/8561); SOB (shortness of breath); Herpes simplex of male genitalia; Headache(784.0); Diabetes mellitus; CHF (congestive heart failure) (HCC); Myocardial infarction (HCC); Stroke [facility]) (2005); OSA (obstructive sleep apnea); Pneumonia; Depression; Bipolar disorder (HCC); and Schizophrenia (HCC).  Past Surgical History:   has past surgical history that includes Cardiac catheterization (12/19/10); ORIF finger / thumb fracture (Right); Colonoscopy w/ polypectomy; Multiple extractions with alveoloplasty (01/27/2014); and Multiple extractions with alveoloplasty (N/A, 01/27/2014).  Social History:   reports that he has never smoked. He has never used smokeless tobacco. He reports that he does not drink alcohol or use illicit drugs.  Skin: intact with healing diabetic ulcer on sole of right foot and healed wound on outer left leg  Patient/Family orientated to room. Information packet given to  patient/family. Admission inpatient armband information verified with patient/family to include name and date of birth and placed on patient arm. Side rails up x 2, fall assessment and education completed with patient/family. Patient/family able to verbalize understanding of risk associated with falls and verbalized understanding to call for assistance before getting out of bed. Call light within reach. Patient/family able to voice and demonstrate understanding of unit orientation instructions.    Will continue to evaluate and treat per MD orders.

## 2015-12-07 NOTE — Care Management Note (Signed)
Case Management Note  Patient Details  Name: Mitchell [name] MRN: [id number] Date of Birth: [birth year]  Subjective/Objective:                    Action/Plan: Patient was admitted with shortness of breath due to CHF.  Admitted for IV diuresis.  Will follow for discharge needs pending patient's progress and physician orders.  Expected Discharge Date:                  Expected Discharge Plan:     In-House Referral:     Discharge planning Services     Post Acute Care Choice:    Choice offered to:     DME Arranged:    DME Agency:     HH Arranged:    HH Agency:     Status of Service:  In process, will continue to follow  Medicare Important Message Given:    Date Medicare IM Given:    Medicare IM give by:    Date Additional Medicare IM Given:    Additional Medicare Important Message give by:     If discussed at Long Length of Stay Meetings, dates discussed:    Additional Comments:  Anda Kraft, RN 12/07/2015, 12:13 PM [phone]

## 2015-12-07 NOTE — Progress Notes (Signed)
Pt AOx4. Pt instructed of high risk fall potential while hospitalized. Pt wishes to be "washed up" and does not want me in the room present to make sure he does not fall. I instructed pt that he is not to use the shower and encouraged pt to sit in chair at sink to wash up. Pt states "I will be okay, please close door and knock before you come in."  Will check on pt in 10 min and continue to monitor.   Jilda Panda RN

## 2015-12-07 NOTE — Progress Notes (Signed)
Pt asking for more water. Reinforced pt education about [id number]. Pt said "but I just need another cup of water". Gave pt another cup of water and encouraged to limit intake due to diuresis. Will continue to monitor.

## 2015-12-08 LAB — MAGNESIUM: MAGNESIUM: 1.7 mg/dL (ref 1.7–2.4)

## 2015-12-08 LAB — CBC
HCT: 33.7 % — ABNORMAL LOW (ref 39.0–52.0)
HEMOGLOBIN: 10.8 g/dL — AB (ref 13.0–17.0)
MCH: 28.1 pg (ref 26.0–34.0)
MCHC: 32 g/dL (ref 30.0–36.0)
MCV: 87.8 fL (ref 78.0–100.0)
Platelets: 164 10*3/uL (ref 150–400)
RBC: 3.84 MIL/uL — AB (ref 4.22–5.81)
RDW: 12.9 % (ref 11.5–15.5)
WBC: 8.3 10*3/uL (ref 4.0–10.5)

## 2015-12-08 LAB — BASIC METABOLIC PANEL
ANION GAP: 10 (ref 5–15)
BUN: 10 mg/dL (ref 6–20)
CALCIUM: 8.6 mg/dL — AB (ref 8.9–10.3)
CO2: 30 mmol/L (ref 22–32)
Chloride: 103 mmol/L (ref 101–111)
Creatinine, Ser: 0.84 mg/dL (ref 0.61–1.24)
GFR calc non Af Amer: >60 mL/min (ref >60)
Glucose, Bld: 153 mg/dL — ABNORMAL HIGH (ref 65–99)
Potassium: 3.2 mmol/L — ABNORMAL LOW (ref 3.5–5.1)
Sodium: 143 mmol/L (ref 135–145)

## 2015-12-08 LAB — GLUCOSE, CAPILLARY: GLUCOSE-CAPILLARY: 118 mg/dL — AB (ref 65–99)

## 2015-12-08 MED ORDER — QUETIAPINE FUMARATE 100 MG PO TABS: 150.0000 mg | ORAL_TABLET | Freq: Every day | ORAL | Status: DC

## 2015-12-08 MED ORDER — LISINOPRIL 2.5 MG PO TABS: 2.5000 mg | ORAL_TABLET | Freq: Every day | ORAL | Status: DC

## 2015-12-08 MED ORDER — SPIRONOLACTONE 50 MG PO TABS: 50.0000 mg | ORAL_TABLET | Freq: Every day | ORAL | Status: DC

## 2015-12-08 NOTE — Discharge Summary (Signed)
Mitchell [name], is a 45 y.o. male  DOB February 11, 1971  MRN [id number].  Admission date:  12/06/2015  Admitting Physician  Eduard Clos, MD  Discharge Date:  12/08/2015   Primary MD  Caffie Damme, MD  Recommendations for primary care physician for things to follow:   Patient has been placed on low dose ace inh for his heart failure, please follow on blood pressure and renal function.   Admission Diagnosis  Cough syncope [R05] Acute on chronic congestive heart failure, unspecified congestive heart failure type (HCC) [I50.9]   Discharge Diagnosis  Cough syncope [R05] Acute on chronic congestive heart failure, unspecified congestive heart failure type (HCC) [I50.9]    Principal Problem:   Acute on chronic systolic heart failure (HCC) Active Problems:   Obstructive sleep apnea   Essential hypertension   CHF (congestive heart failure) (HCC)   Type 2 diabetes mellitus with hyperglycemia (HCC)      Past Medical History  Diagnosis Date  . Hypertension   . Hyperlipidemia   . CVA (cerebral infarction)     No residual deficits  . Non-ischemic cardiomyopathy (HCC)     No ischemia on myoview, showed EF of 42%. 2D echo showed EF 50-55% with diatolic dysfunction in 2009  . Post-cardiac injury syndrome (HCC)     History of cardiac injury from blunt trauma  . PVCs (premature ventricular contractions)   . History of colonic polyps   . Obesity   . Syncope     Recurrent, thought to be vasovagal. Also has h/o frequent PVCs.   . Chronic systolic heart failure (HCC) 11/2010    a. NICM 4/14 EF 20-25%, TR mild  . SOB (shortness of breath)   . 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 Headache(784.0)   . Diabetes mellitus     TYPE II; UNCONTROLLED BY HEMOGLOBIN A1c; STABLE AS  PER DISCHARGE  . CHF (congestive heart failure) (HCC)   . Myocardial infarction [name])     at age 54 years old (while playing football)    . Stroke [facility]) 2005    some left side weakness  . OSA (obstructive sleep apnea)     Being set up again for c-pap  . Pneumonia   . Depression     PTSD,   . Bipolar disorder (HCC)   . Schizophrenia (HCC)     Goes to [facility]    Past Surgical History  Procedure Laterality Date  . Cardiac catheterization  12/19/10    DIFFUSE NONOBSTRUCTIVE CAD; NONISCHEMIC CARDIOMYOPATHY; LEFT VENTRICULAR ANGIOGRAM WAS PERFORMED SECONDARY TO  ELEVATED LEFT VENTRICULAR FILLING PRESSURES  . Orif finger / thumb fracture Right   . Colonoscopy w/ polypectomy    . Multiple extractions with alveoloplasty  01/27/2014    "all my teeth; 4 Quadrants of alveoloplasty  . Multiple extractions with alveoloplasty N/A 01/27/2014    Procedure: EXTRACTION OF TEETH #'1, 2, 3, 4, 5, 6, 7, 8, 9, 10, 11, 12, 13, 14, 15, 16, 17, 20, 21, 22, 23,  24, 25, 26, 27, 28, 29, 31 and 32 WITH ALVEOLOPLASTY;  Surgeon: Charlynne Pander, DDS;  Location: [facility] OR;  Service: Oral Surgery;  Laterality: N/A;       HPI  from the history and physical done on the day of admission:    This is a 45 y/o male with known systolic heart failure who presents to the hospital with the chief complain of shortness of breath for the last 4 to 5 days, associated with orthopnea and weight gain 7 lbs from his baseline. Patient has been off spironolactone for the last 7 days before admission. Dyspnea associated with episodic cough. The day of admission had a syncope episode related to coughing episode. On initial physical examination his blood pressure was 142 and 152 systolic, s1- s2 present, lungs had no rhonchi, lower extremities with positive edema. His cr was 0.81 with K at 3.3. Chest film with vascular congestion consistent with cardiogenic pulmonary edema, his ekg showed left axis deviation, normal intervals with poor r-r wave progression on the precordial leads.  Patient was admitted to the hospital with the working diagnosis of  decompensated systolic heart failure acute on chronic complicated with cardiogenic pulmonary edema.    Hospital Course:    1. Cardiovascular. Patient responded well to diuresis with IV furosemide, it was achieved negative fluid balance about 4 liters per day with a total of 8800 cc since admission. Patient will be placed on lisinopril low dose 2.5 mg daily, and will continue digoxin and coreg. Patient has non ischemic cardiomyopathy, decompensation presumed to be related to medical and dietary non compliance. Patient will get heart failure teaching before discharge.  2, Pulmonary. Cardiogenic pulmonary edema, clinically improved with diuresis, will continue furosemide at home and target negative fluid balance.  3, Nephrology, Renal function tolerated well diuresis with discharge cr at 0.84 with K at 3.2. Will hold on  K supplements, patient on aldactone and started on lisinopril. Patient will need close follow up on renal function and electrolytes as outpatient.  4. Endocrinology. Patient was continued on insulin therapy with good toleration, serum glucose at discharge 153, will continue home regimen of 70/30 insulin.  5. Neruro/psych. No agitation or confusion, patient was continued on quetiapine.  Discharge Condition:  Stable  Follow UP  Follow-up Information    Follow up with Caffie Damme, MD In 1 week.   Specialty:  Family Medicine   Why:  Post hospitalization follow up.   Contact information:   [address] [place] [address] [phone]        Consults obtained -  Diet and Activity recommendation: See Discharge Instructions below  Discharge Instructions    Patient will follow on primary care in 7 days. Patient has been started on lisinopril for heart failure, will need a close follow up on renal function and electrolytes.      Discharge Medications       Medication List    TAKE these medications        acetaminophen 500 MG tablet  Commonly known as:   TYLENOL  Take 1 tablet (500 mg total) by mouth every 6 (six) hours as needed.     atorvastatin 40 MG tablet  Commonly known as:  LIPITOR  Take 40 mg by mouth every evening.     carvedilol 25 MG tablet  Commonly known as:  COREG  Take 1 tablet (25 mg total) by mouth 2 (two) times daily with a meal.     cyclobenzaprine 5 MG  tablet  Commonly known as:  FLEXERIL  Take 1 tablet (5 mg total) by mouth 3 (three) times daily as needed for muscle spasms.     digoxin 0.25 MG tablet  Commonly known as:  LANOXIN  Take 1 tablet (0.25 mg total) by mouth daily.     furosemide 40 MG tablet  Commonly known as:  LASIX  Take 1 tablet (40 mg total) by mouth 2 (two) times daily.     HYDROcodone-acetaminophen 5-325 MG tablet  Commonly known as:  NORCO/VICODIN  Take 1-2 tablets by mouth every 6 (six) hours as needed.     insulin NPH-regular Human (70-30) 100 UNIT/ML injection  Commonly known as:  NOVOLIN 70/30  Inject 40 Units into the skin 2 (two) times daily with a meal.     lisinopril 2.5 MG tablet  Commonly known as:  ZESTRIL  Take 1 tablet (2.5 mg total) by mouth daily.     methocarbamol 500 MG tablet  Commonly known as:  ROBAXIN  Take 1 tablet (500 mg total) by mouth 2 (two) times daily.     QUEtiapine 100 MG tablet  Commonly known as:  SEROQUEL  Take 1.5 tablets (150 mg total) by mouth at bedtime.     spironolactone 50 MG tablet  Commonly known as:  ALDACTONE  Take 1 tablet (50 mg total) by mouth daily.        Major procedures and Radiology Reports - PLEASE review detailed and final reports for all details, in brief -      Dg Chest 2 View  12/06/2015  CLINICAL DATA:  Shortness of breath.  Left chest pain for 3 days. EXAM: CHEST  2 VIEW COMPARISON:  10/30/2015 FINDINGS: Cardiomegaly with vascular congestion and mild pulmonary edema. No effusions. No acute bony abnormality. IMPRESSION: Mild CHF. Electronically Signed   By: Charlett Nose M.D.   On: 12/06/2015 19:36    Micro  Results    No results found for this or any previous visit (from the past 240 hour(s)).     Today   Subjective    Adeeb Konecny Dyspnea has improved, patient out of bed, no cough, no nausea or vomiting. Tolerating po well.   Objective   Blood pressure 96/54, pulse 86, temperature 98.3 F (36.8 C), temperature source Oral, resp. rate 18, height 5\' 11"  (1.803 m), weight 127.8 kg (281 lb 12 oz), SpO2 100 %.   Intake/Output Summary (Last 24 hours) at 12/08/15 1030 Last data filed at 12/08/15 0917  Gross per 24 hour  Intake   1420 ml  Output   2950 ml  Net  -1530 ml    Exam Awake Alert, Oriented x 3, No new F.N deficits, Normal affect [place].AT,PERRAL Supple Neck,No JVD, No cervical lymphadenopathy appriciated.  Symmetrical Chest wall movement, bilaterally vesicular breath sounds, CTAB RRR,No Gallops,Rubs or new Murmurs, No Parasternal Heave +ve B.Sounds, Abd Soft, Non tender, No organomegaly appriciated, No rebound -guarding or rigidity. No Cyanosis, trace edema, No new Rash or bruise   Data Review   CBC w Diff: Lab Results  Component Value Date   WBC 8.3 12/08/2015   HGB 10.8* 12/08/2015   HCT 33.7* 12/08/2015   PLT 164 12/08/2015   LYMPHOPCT 14 10/30/2015   MONOPCT 5 10/30/2015   EOSPCT 1 10/30/2015   BASOPCT 0 10/30/2015    CMP: Lab Results  Component Value Date   NA 143 12/08/2015   K 3.2* 12/08/2015   CL 103 12/08/2015   CO2 30 12/08/2015  BUN 10 12/08/2015   CREATININE 0.84 12/08/2015   PROT 7.3 12/08/2014   ALBUMIN 3.8 12/08/2014   BILITOT 0.3 12/08/2014   ALKPHOS 102 12/08/2014   AST 23 12/08/2014   ALT 17 12/08/2014  .   Total Time in preparing paper work, data evaluation and todays exam - 45 minutes  Coralie Keens M.D on 12/08/2015 at 10:30 AM  [facility]   Office  [phone]

## 2015-12-08 NOTE — Discharge Instructions (Signed)
Start taking lisinopril 2.5 mg daily if blood pressure greater than 135/85.  Please follow sodium restricted diet and fluid restriction 1200 ml in 24 hours. Follow up with Dr Katrinka Blazing and Cardiology as outpatient.

## 2015-12-08 NOTE — Progress Notes (Signed)
Heart Failure Navigator Consult Note  Presentation: Mitchell [name] is a 45 y.o. male with medical history significant of systolic heart failure, diabetes mellitus, hypertension, hyperlipidemia presents with complaints of shortness of breath. Patient has been having increasing shortness of breath over the last 4-5 days particularly on lying down. Patient also has gained 7 pounds from his baseline. Patient states he ran out of his spironolactone last week. Patient has been having continuous coughing spells minimal sputum production. Denies any fever chills or chest pain. Yesterday while driving his car which was parked at his home he had a syncopal episode after coughing. Chest x-ray shows congestion with labs showing BNP around 300. Patient will be admitted for acute on chronic systolic heart failure.   Past Medical History  Diagnosis Date  . Hypertension   . Hyperlipidemia   . CVA (cerebral infarction)     No residual deficits  . Non-ischemic cardiomyopathy (HCC)     No ischemia on myoview, showed EF of 42%. 2D echo showed EF 50-55% with diatolic dysfunction in 2009  . Post-cardiac injury syndrome (HCC)     History of cardiac injury from blunt trauma  . PVCs (premature ventricular contractions)   . History of colonic polyps   . Obesity   . Syncope     Recurrent, thought to be vasovagal. Also has h/o frequent PVCs.   . Chronic systolic heart failure (HCC) 11/2010    a. NICM 4/14 EF 20-25%, TR mild  . SOB (shortness of breath)   . 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 Headache(784.0)   . Diabetes mellitus     TYPE II; UNCONTROLLED BY HEMOGLOBIN A1c; STABLE AS  PER DISCHARGE  . CHF (congestive heart failure) (HCC)   . Myocardial infarction [facility])     at age 76 years old (while playing football)  . Stroke [facility]) 2005    some left side weakness  . OSA (obstructive sleep apnea)     Being set up again for c-pap  . Pneumonia   . Depression     PTSD,   . Bipolar disorder (HCC)   .  Schizophrenia (HCC)     Goes to [facility]    Social History   Social History  . Marital Status: Married    Spouse Name: N/A  . Number of Children: 5  . Years of Education: N/A   Occupational History  . Mortician    Social History Main Topics  . Smoking status: Never Smoker   . Smokeless tobacco: Never Used  . Alcohol Use: No  . Drug Use: No  . Sexual Activity: Yes   Other Topics Concern  . 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    ECHO:Study Conclusions  - Left ventricle: The cavity size was moderately dilated. Systolic function was severely reduced. The estimated ejection fraction was in the range of 20% to 25%. Diffuse hypokinesis. There is akinesis of the entireanteroseptal myocardium. - Mitral valve: Mild regurgitation. - Left atrium: The atrium was moderately dilated. - Right ventricle: The cavity size was mildly dilated. Wall thickness was normal. - Right atrium: The atrium was mildly dilated. Impressions:  - Compared to the prior study, there has been no significant interval change. Transthoracic echocardiography. M-mode, complete 2D, spectral Doppler, and color Doppler. Height: Height: 182.9cm. Height: 72in.  Weight: Weight: 125.6kg. Weight: 276.3lb. Body mass index: BMI: 37.6kg/m^2. Body surface area:  BSA: 2.18m^2. Blood pressure:   140/92. Patient status: Inpatient. Location: Echo laboratory.  BNP    Component Value Date/Time   BNP 368.4* 12/06/2015 2033    ProBNP    Component Value Date/Time   PROBNP 235.1* 01/07/2014 1955     Education Assessment and Provision:  Detailed education and instructions provided on heart failure disease management including the following:  Signs and symptoms of Heart Failure When to call the physician Importance of  daily weights Low sodium diet Fluid restriction Medication management Anticipated future follow-up appointments  Patient education given on each of the above topics.  Patient acknowledges understanding and acceptance of all instructions.  I spoke with Mr. Ellender regarding his HF and hospitalization.  He admits that he has not been weighing each day or eating a low sodium diet lately.  I reviewed the importance of daily weights and encouraged him to begin again.  He says that he will.  I reviewed a low sodium diet and high sodium foods to avoid.  He understands that he has not been eating the right foods and has even been adding table salt to certain foods.  He tells me that he will make needed corrections.  He denies any issues with getting or taking prescribed medications-- although he admits that he ran out of some medications before admission.   He lives in [place] with his wife and works full time.  He will [facility]-up with the [facility].     Education Materials:  "Living Better With Heart Failure" Booklet, Daily Weight Tracker Tool    High Risk Criteria for Readmission and/or Poor Patient Outcomes:  (Recommend Follow-up with [facility])--Yes    EF <30%- yes 20-25%  2 or more admissions in 6 months- No  Difficult social situation- No  Demonstrates medication noncompliance- Denies-however he did run out of some medications before admission to hospital    Barriers of Care:  Knowledge and COMPLIANCE  Discharge Planning:   Patient will return home with wife.  He has a follow-up appt with [facility] 5/17 at 1130.

## 2015-12-08 NOTE — Care Management Note (Signed)
Case Management Note  Patient Details  Name: DEMBA HULLENDER MRN: [id number] Date of Birth: 01/08/1971  Subjective/Objective:       Admitted with CHF             Action/Plan: Patient lives at home with spouse, independent of all ADL's; PCP is Dr Vira Browns; has private insurance with [facility] with prescription drug coverage; pharmacy of choice is [facility] ( [address]) pt reports no problem getting his medication. CM received message that the patient ran out of his medication; Patient stated that it was his fault, he went out of town and did not pack enough of his medication. No other issues/ concerns voiced.   Expected Discharge Date:    12/08/2015              Expected Discharge Plan:  Home/Self Care  Discharge planning Services  CM Consult  Choice offered to:  Patient, NA  Status of Service:  Completed, signed off  Reola Mosher [phone] 12/08/2015, 11:00 AM

## 2015-12-08 NOTE — Progress Notes (Signed)
Pt has orders to be discharged. Discharge instructions given and pt has no additional questions at this time. Medication regimen reviewed and pt educated. Pt verbalized understanding and has no additional questions. Telemetry box removed. IV removed and site in good condition. Pt stable and waiting for transportation.   Yuliya Nova RN 

## 2015-12-15 ENCOUNTER — Inpatient Hospital Stay (HOSPITAL_COMMUNITY): Admission: RE | Admit: 2015-12-15 | Payer: Self-pay | Source: Ambulatory Visit

## 2015-12-30 ENCOUNTER — Encounter (HOSPITAL_BASED_OUTPATIENT_CLINIC_OR_DEPARTMENT_OTHER): Payer: No Typology Code available for payment source | Attending: Internal Medicine

## 2015-12-30 DIAGNOSIS — L97512 Non-pressure chronic ulcer of other part of right foot with fat layer exposed: Secondary | ICD-10-CM | POA: Insufficient documentation

## 2015-12-30 DIAGNOSIS — L84 Corns and callosities: Secondary | ICD-10-CM | POA: Diagnosis not present

## 2015-12-30 DIAGNOSIS — G4730 Sleep apnea, unspecified: Secondary | ICD-10-CM | POA: Diagnosis not present

## 2015-12-30 DIAGNOSIS — I252 Old myocardial infarction: Secondary | ICD-10-CM | POA: Insufficient documentation

## 2015-12-30 DIAGNOSIS — E11621 Type 2 diabetes mellitus with foot ulcer: Secondary | ICD-10-CM | POA: Diagnosis present

## 2015-12-30 DIAGNOSIS — I110 Hypertensive heart disease with heart failure: Secondary | ICD-10-CM | POA: Insufficient documentation

## 2015-12-30 DIAGNOSIS — I509 Heart failure, unspecified: Secondary | ICD-10-CM | POA: Insufficient documentation

## 2016-01-07 DIAGNOSIS — E11621 Type 2 diabetes mellitus with foot ulcer: Secondary | ICD-10-CM | POA: Diagnosis not present

## 2016-01-14 DIAGNOSIS — E11621 Type 2 diabetes mellitus with foot ulcer: Secondary | ICD-10-CM | POA: Diagnosis not present

## 2016-01-21 DIAGNOSIS — E11621 Type 2 diabetes mellitus with foot ulcer: Secondary | ICD-10-CM | POA: Diagnosis not present

## 2016-01-27 DIAGNOSIS — E11621 Type 2 diabetes mellitus with foot ulcer: Secondary | ICD-10-CM | POA: Diagnosis not present

## 2016-02-03 ENCOUNTER — Encounter (HOSPITAL_BASED_OUTPATIENT_CLINIC_OR_DEPARTMENT_OTHER): Payer: No Typology Code available for payment source | Attending: Internal Medicine

## 2016-02-03 DIAGNOSIS — E11621 Type 2 diabetes mellitus with foot ulcer: Secondary | ICD-10-CM | POA: Insufficient documentation

## 2016-02-03 DIAGNOSIS — I509 Heart failure, unspecified: Secondary | ICD-10-CM | POA: Diagnosis not present

## 2016-02-03 DIAGNOSIS — L97411 Non-pressure chronic ulcer of right heel and midfoot limited to breakdown of skin: Secondary | ICD-10-CM | POA: Diagnosis not present

## 2016-02-03 DIAGNOSIS — G4730 Sleep apnea, unspecified: Secondary | ICD-10-CM | POA: Insufficient documentation

## 2016-02-03 DIAGNOSIS — I110 Hypertensive heart disease with heart failure: Secondary | ICD-10-CM | POA: Diagnosis not present

## 2016-02-03 DIAGNOSIS — Z794 Long term (current) use of insulin: Secondary | ICD-10-CM | POA: Insufficient documentation

## 2016-02-03 DIAGNOSIS — I252 Old myocardial infarction: Secondary | ICD-10-CM | POA: Diagnosis not present

## 2016-02-09 DIAGNOSIS — E11621 Type 2 diabetes mellitus with foot ulcer: Secondary | ICD-10-CM | POA: Diagnosis not present

## 2016-04-06 ENCOUNTER — Emergency Department (HOSPITAL_BASED_OUTPATIENT_CLINIC_OR_DEPARTMENT_OTHER)
Admission: EM | Admit: 2016-04-06 | Discharge: 2016-04-07 | Disposition: A | Discharge status: Home / Self Care | Payer: No Typology Code available for payment source | Attending: Emergency Medicine | Admitting: Emergency Medicine

## 2016-04-06 ENCOUNTER — Encounter (HOSPITAL_BASED_OUTPATIENT_CLINIC_OR_DEPARTMENT_OTHER): Payer: Self-pay | Admitting: *Deleted

## 2016-04-06 DIAGNOSIS — I509 Heart failure, unspecified: Secondary | ICD-10-CM | POA: Insufficient documentation

## 2016-04-06 DIAGNOSIS — Y929 Unspecified place or not applicable: Secondary | ICD-10-CM | POA: Insufficient documentation

## 2016-04-06 DIAGNOSIS — Y999 Unspecified external cause status: Secondary | ICD-10-CM | POA: Diagnosis not present

## 2016-04-06 DIAGNOSIS — Z79899 Other long term (current) drug therapy: Secondary | ICD-10-CM | POA: Insufficient documentation

## 2016-04-06 DIAGNOSIS — S90821A Blister (nonthermal), right foot, initial encounter: Secondary | ICD-10-CM | POA: Diagnosis not present

## 2016-04-06 DIAGNOSIS — X58XXXA Exposure to other specified factors, initial encounter: Secondary | ICD-10-CM | POA: Diagnosis not present

## 2016-04-06 DIAGNOSIS — Z794 Long term (current) use of insulin: Secondary | ICD-10-CM | POA: Insufficient documentation

## 2016-04-06 DIAGNOSIS — I110 Hypertensive heart disease with heart failure: Secondary | ICD-10-CM | POA: Insufficient documentation

## 2016-04-06 DIAGNOSIS — L89899 Pressure ulcer of other site, unspecified stage: Secondary | ICD-10-CM | POA: Diagnosis present

## 2016-04-06 DIAGNOSIS — E1165 Type 2 diabetes mellitus with hyperglycemia: Secondary | ICD-10-CM | POA: Diagnosis not present

## 2016-04-06 DIAGNOSIS — Y9301 Activity, walking, marching and hiking: Secondary | ICD-10-CM | POA: Diagnosis not present

## 2016-04-06 DIAGNOSIS — Z23 Encounter for immunization: Secondary | ICD-10-CM | POA: Diagnosis not present

## 2016-04-06 DIAGNOSIS — R739 Hyperglycemia, unspecified: Secondary | ICD-10-CM

## 2016-04-06 LAB — CBG MONITORING, ED
GLUCOSE-CAPILLARY: 284 mg/dL — AB (ref 65–99)
GLUCOSE-CAPILLARY: 396 mg/dL — AB (ref 65–99)

## 2016-04-06 MED ORDER — INSULIN ASPART 100 UNIT/ML ~~LOC~~ SOLN
5.0000 [IU] | Freq: Once | SUBCUTANEOUS | Status: AC
  Administered 2016-04-06: 5 [IU] via SUBCUTANEOUS
  Filled 2016-04-06: qty 1

## 2016-04-06 MED ORDER — INSULIN NPH ISOPHANE & REGULAR (70-30) 100 UNIT/ML ~~LOC~~ SUSP: 40.0000 [IU] | Freq: Two times a day (BID) | SUBCUTANEOUS | 1 refills | Status: DC

## 2016-04-06 MED ORDER — CEPHALEXIN 500 MG PO CAPS: 500.0000 mg | ORAL_CAPSULE | Freq: Three times a day (TID) | ORAL | 0 refills | Status: DC

## 2016-04-06 MED ORDER — LIDOCAINE HCL 2 % IJ SOLN
10.0000 mL | Freq: Once | INTRAMUSCULAR | Status: DC
  Filled 2016-04-06: qty 20

## 2016-04-06 MED ORDER — TETANUS-DIPHTH-ACELL PERTUSSIS 5-2.5-18.5 LF-MCG/0.5 IM SUSP
0.5000 mL | Freq: Once | INTRAMUSCULAR | Status: AC
Start: 2016-04-06 — End: 2016-04-06
  Administered 2016-04-06: 0.5 mL via INTRAMUSCULAR
  Filled 2016-04-06: qty 0.5

## 2016-04-06 NOTE — ED Provider Notes (Signed)
[facility] Provider Note   CSN: [id number] Arrival date & time: 04/06/16  2047  By signing my name below, I, Mitchell [name], attest that this documentation has been prepared under the direction and in the presence of Fayrene Helper, PA-C.  Electronically Signed: Rosario [name], ED Scribe. 04/06/16. 10:25 PM.  History   Chief Complaint Chief Complaint  Patient presents with  . Skin Ulcer   The history is provided by the patient. No language interpreter was used.   HPI Comments: Mitchell [name] is a 45 y.o. male with a PMHx of DM2 and CHF,  who presents to the Emergency Department complaining of gradual onset, gradually worsening ulceration to the dorsal right foot x ~3 days. Pt states that he has associated waxing and waning, throbbing pain onset ~2 days to surrounding area, worsening after removing his pressure dressing tonight PTA.  He reports that he has had three similar ulcers to his right foot due to his hx of DM. Pt has been treated by a wound care center for this recurrent issue each time with complete relief. Pt last saw his wound care specialist ~3 months ago. He notes that while walking in the rain yesterday that his shoes became wet, which significantly worsened the area. Pt has been wrapping this foot with pressure dressings and applying Diabetic Hydrating Lotion with minimal relief of this problem. His pain to the area is worsened to palpation and ambulation. Pt has been ambulating w/ crutches since the onset of this problem. No recent new foot wear. He additionally reports that his diabetes has not been well controlled recently. Pt is rx'd insulin daily, however endorses compliance issues. He does not check his CBG at home secondary to not having a glucose monitor. Pt notes that he urinates frequently at baseline secondary to his daily CHF medications. Denies fever, CP, SOB, numbness, abdominal pain, polyphagia, polydipsia, or any other associated symptoms. Tetanus  is not UTD.   Pt also notes at beside that he is out of his daily insulin and is requesting a refill.   Past Medical History:  Diagnosis Date  . Bipolar disorder (HCC)   . CHF (congestive heart failure) (HCC)   . Chronic systolic heart failure (HCC) 11/2010   a. NICM 4/14 EF 20-25%, TR mild  . CVA (cerebral infarction)    No residual deficits  . Depression    PTSD,   . Diabetes mellitus    TYPE II; UNCONTROLLED BY HEMOGLOBIN A1c; STABLE AS  PER DISCHARGE  . Headache(784.0)   . 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 History of colonic polyps   . Hyperlipidemia   . Hypertension   . Myocardial infarction [facility])    at age 57 years old (while playing football)  . Non-ischemic cardiomyopathy (HCC)    No ischemia on myoview, showed EF of 42%. 2D echo showed EF 50-55% with diatolic dysfunction in 2009  . Obesity   . OSA (obstructive sleep apnea)    Being set up again for c-pap  . Pneumonia   . Post-cardiac injury syndrome (HCC)    History of cardiac injury from blunt trauma  . PVCs (premature ventricular contractions)   . Schizophrenia (HCC)    Goes to [facility]  . SOB (shortness of breath)   . Stroke [facility]) 2005   some left side weakness  . Syncope    Recurrent, thought to be vasovagal. Also has h/o frequent PVCs.    Patient Active Problem List  Diagnosis Date Noted  . Type 2 diabetes mellitus with hyperglycemia (HCC) 12/07/2015  . Heart failure (HCC) 01/27/2014  . Noncompliance 01/22/2014  . Snores 01/22/2014  . DKA, type 2, not at goal [facility]) 01/08/2014  . Syncope 01/07/2014  . Acute respiratory failure with hypoxia (HCC) 11/25/2013  . CHF (congestive heart failure) (HCC) 11/22/2013  . PNA (pneumonia) 11/22/2013  . CAP (community acquired pneumonia) 07/22/2013  . Community acquired bacterial pneumonia 07/22/2013  . Chronic systolic heart failure (HCC) 10/02/2011  . Erectile dysfunction 10/02/2011  . Acute on chronic systolic heart failure (HCC)  05/20/1172  . Obstructive sleep apnea 10/11/2007  . Type II diabetes mellitus, uncontrolled 10/10/2007  . HYPERLIPIDEMIA 10/10/2007  . HYPOKALEMIA 10/10/2007  . OBESITY 10/10/2007  . Essential hypertension 10/10/2007  . PREMATURE VENTRICULAR CONTRACTIONS 10/10/2007  . SYNCOPE 10/10/2007  . COLONIC POLYPS, HX OF 10/10/2007   Past Surgical History:  Procedure Laterality Date  . CARDIAC CATHETERIZATION  12/19/10   DIFFUSE NONOBSTRUCTIVE CAD; NONISCHEMIC CARDIOMYOPATHY; LEFT VENTRICULAR ANGIOGRAM WAS PERFORMED SECONDARY TO  ELEVATED LEFT VENTRICULAR FILLING PRESSURES  . COLONOSCOPY W/ POLYPECTOMY    . MULTIPLE EXTRACTIONS WITH ALVEOLOPLASTY  01/27/2014   "all my teeth; 4 Quadrants of alveoloplasty  . MULTIPLE EXTRACTIONS WITH ALVEOLOPLASTY N/A 01/27/2014   Procedure: EXTRACTION OF TEETH #'1, 2, 3, 4, 5, 6, 7, 8, 9, 10, 11, 12, 13, 14, 15, 16, 17, 20, 21, 22, 23, 24, 25, 26, 27, 28, 29, 31 and 32 WITH ALVEOLOPLASTY;  Surgeon: Charlynne Pander, DDS;  Location: [facility] OR;  Service: Oral Surgery;  Laterality: N/A;  . ORIF FINGER / THUMB FRACTURE Right     Home Medications    Prior to Admission medications   Medication Sig Start Date End Date Taking? Authorizing Provider  acetaminophen (TYLENOL) 500 MG tablet Take 1 tablet (500 mg total) by mouth every 6 (six) hours as needed. 06/21/15   Cheri Fowler, PA-C  atorvastatin (LIPITOR) 40 MG tablet Take 40 mg by mouth every evening. 09/24/15   Historical Provider, MD  carvedilol (COREG) 25 MG tablet Take 1 tablet (25 mg total) by mouth 2 (two) times daily with a meal. 12/09/14   Paula Libra, MD  cyclobenzaprine (FLEXERIL) 5 MG tablet Take 1 tablet (5 mg total) by mouth 3 (three) times daily as needed for muscle spasms. 06/21/15   Cheri Fowler, PA-C  digoxin (LANOXIN) 0.25 MG tablet Take 1 tablet (0.25 mg total) by mouth daily. 12/09/14   John Molpus, MD  furosemide (LASIX) 40 MG tablet Take 1 tablet (40 mg total) by mouth 2 (two) times daily. 12/09/14   John  Molpus, MD  HYDROcodone-acetaminophen (NORCO/VICODIN) 5-325 MG per tablet Take 1-2 tablets by mouth every 6 (six) hours as needed. 01/16/15   Ladona Mow, PA-C  insulin NPH-regular Human (NOVOLIN 70/30) (70-30) 100 UNIT/ML injection Inject 40 Units into the skin 2 (two) times daily with a meal. 12/09/14   John Molpus, MD  lisinopril (ZESTRIL) 2.5 MG tablet Take 1 tablet (2.5 mg total) by mouth daily. 12/08/15   Mauricio Annett Gula, MD  methocarbamol (ROBAXIN) 500 MG tablet Take 1 tablet (500 mg total) by mouth 2 (two) times daily. 07/06/15   Samantha Tripp Dowless, PA-C  QUEtiapine (SEROQUEL) 100 MG tablet Take 1.5 tablets (150 mg total) by mouth at bedtime. 12/08/15   Mauricio Annett Gula, MD  spironolactone (ALDACTONE) 50 MG tablet Take 1 tablet (50 mg total) by mouth daily. 12/08/15   Mauricio Annett Gula, MD   Family History Family  History  Problem Relation Age of Onset  . Heart disease Mother     MI  . Heart failure Mother   . Diabetes Mother     ALSO IN MOST OF HIS SIBLINGS; 2 UNLCES HAVE ALSO PASSED AWAY FROM DM  . Cardiomyopathy Mother   . Cancer - Ovarian Mother   . Heart disease Father   . Hypertension Father   . Diabetes Father     Social History Social History  Substance Use Topics  . Smoking status: Never Smoker  . Smokeless tobacco: Never Used  . Alcohol use No   Allergies   Nsaids  Review of Systems Review of Systems  Constitutional: Negative for fever.  Respiratory: Negative for shortness of breath.   Gastrointestinal: Negative for abdominal pain.  Endocrine: Negative for polydipsia and polyphagia.  Musculoskeletal: Positive for myalgias.  Skin: Positive for wound.  Neurological: Negative for numbness.  All other systems reviewed and are negative.  Physical Exam Updated Vital Signs BP 162/91 (BP Location: Right Arm)   Pulse 89   Temp 98 F (36.7 C) (Oral)   Resp 20   Ht 6' (1.829 m)   Wt 281 lb (127.5 kg)   SpO2 100%   BMI 38.11 kg/m   Physical  Exam  Constitutional: He appears well-developed and well-nourished.  HENT:  Head: Normocephalic.  Eyes: Conjunctivae are normal.  Cardiovascular: Normal rate, regular rhythm, normal heart sounds and intact distal pulses.   No murmur heard. Pulmonary/Chest: Effort normal and breath sounds normal. No respiratory distress. He has no wheezes. He has no rales.  Abdominal: He exhibits no distension.  Musculoskeletal: Normal range of motion.  Neurological: He is alert.  Skin: Skin is warm and dry. No erythema.  Right foot with 5cmx3cm  blister noted to the lateral ventral aspect of the foot. Area is mildly TTP. No surrounding erythema. Small dried blood noted on exam.   Psychiatric: He has a normal mood and affect. His behavior is normal.  Nursing note and vitals reviewed.         ED Treatments / Results  DIAGNOSTIC STUDIES: Oxygen Saturation is 100% on RA, normal by my interpretation.   COORDINATION OF CARE: 10:04 PM-Discussed next steps with pt including I&D. Pt verbalized understanding and is agreeable with the plan.   Labs (all labs ordered are listed, but only abnormal results are displayed) Labs Reviewed  CBG MONITORING, ED - Abnormal; Notable for the following:       Result Value   Glucose-Capillary 396 (*)    All other components within normal limits  CBG MONITORING, ED - Abnormal; Notable for the following:    Glucose-Capillary 284 (*)    All other components within normal limits   Radiology No results found.  Procedures Procedures (including critical care time)  INCISION AND DRAINAGE Performed by: Fayrene HelperRAN,Macari Zalesky Consent: Verbal consent obtained. Risks and benefits: risks, benefits and alternatives were discussed Type: abscess  Body area: R foot, ventral  Anesthesia: none  Incision was made with a scalpel.  Local anesthetic: none  Anesthetic total: none  Complexity: simple.  Blister skin was trimmed using 11 blade and sterile scissor.  Wound  irrigated.  Drainage: serous sanguianous  Drainage amount: moderate  Packing material: wet to dry dressing  Patient tolerance: Patient tolerated the procedure well with no immediate complications.     Medications Ordered in ED Medications  Tdap (BOOSTRIX) injection 0.5 mL (not administered)  lidocaine (XYLOCAINE) 2 % (with pres) injection 200 mg (not administered)  insulin aspart (novoLOG) injection 5 Units (not administered)   Initial Impression / Assessment and Plan / ED Course  I have reviewed the triage vital signs and the nursing notes.  Pertinent labs & imaging results that were available during my care of the patient were reviewed by me and considered in my medical decision making (see chart for details).  Clinical Course   Mitchell [name] is a 45 y.o. male with a PMHx of DM, who presents to the Emergency Department complaining of a blister to the dorsal right foot. Pt reports that he has a hx of uncontrolled DM and similar ulcerations to the same area. Counseled patient on importance of diabetic home care, and risks of otherwise continued noncompliance of medications.  Discussed with pt I&D procedure to drain the area at beside, which he is agreeable with. Will d/c home w/ a course of abx. Discussed new or worsening symptoms and causes of concern to return to the ED. Answered all questions at bedside.   Final Clinical Impressions(s) / ED Diagnoses   Final diagnoses:  Blister of right foot without infection, initial encounter  Hyperglycemia   New Prescriptions New Prescriptions   CEPHALEXIN (KEFLEX) 500 MG CAPSULE    Take 1 capsule (500 mg total) by mouth 3 (three) times daily.   I personally performed the services described in this documentation, which was scribed in my presence. The recorded information has been reviewed and is accurate.   11:40 PM Pt has a large blister to R foot.  Has hx of poorly controlled diabetes.  Blister were evacuated and ligated.  Wound  cleansed and dressing applied.  abx prescribed and pt will f/u closely with his wound care center for further management.  Time were spent discussing appropriate wound care including regular wet to dry dressing change and monitor for signs of infection.    12:05 AM CBG improves with insulin.  Pt stable for discharge.    Fayrene Helper, PA-C 04/07/16 0006    Rolan Bucco, MD 04/07/16 [phone]

## 2016-04-06 NOTE — Discharge Instructions (Signed)
Change dressing daily.  Take antibiotic to prevent skin infection.  Follow up closely with your wound care center for further management of your blister.  Your blood sugar is high, please monitor your blood sugar regularly and follow up with your doctor for further care.

## 2016-04-06 NOTE — ED Triage Notes (Addendum)
He is diabetic. He got water in his shoe yesterday and didn't  Know it. He already had a ulcer on the bottom of his foot but after his foot sat in water it got worse. He states he just finished treatment at the wound center. He is out of Insulin.

## 2016-04-06 NOTE — ED Notes (Signed)
Patient is seen by a wound care doctor.  Has been treated for a wound on the bottom lateral portion of his right foot. He reports that yesterday he realized that water had gotten into his shoe and soaked the dressing that was covering his wound.  Wound is now painful. Upon assessment it appears blistered and leaking bloody fluid.

## 2016-04-07 ENCOUNTER — Telehealth: Payer: Self-pay | Admitting: *Deleted

## 2016-04-07 NOTE — Telephone Encounter (Signed)
Pharmacist called to verify unsigned prescription.  EDCM verified pt ED admission 04/06/16 ([facility]) and prescription written.

## 2016-04-07 NOTE — ED Notes (Signed)
CBG is 284

## 2016-04-07 NOTE — ED Notes (Signed)
Pts wound dressed with bulky dressing.  Advised on when and how to change dressing.  Pt will follow up with wound care center.

## 2016-04-20 ENCOUNTER — Encounter (HOSPITAL_BASED_OUTPATIENT_CLINIC_OR_DEPARTMENT_OTHER): Payer: No Typology Code available for payment source | Attending: Internal Medicine

## 2016-04-20 DIAGNOSIS — I110 Hypertensive heart disease with heart failure: Secondary | ICD-10-CM | POA: Insufficient documentation

## 2016-04-20 DIAGNOSIS — I509 Heart failure, unspecified: Secondary | ICD-10-CM | POA: Diagnosis not present

## 2016-04-20 DIAGNOSIS — I252 Old myocardial infarction: Secondary | ICD-10-CM | POA: Insufficient documentation

## 2016-04-20 DIAGNOSIS — L97812 Non-pressure chronic ulcer of other part of right lower leg with fat layer exposed: Secondary | ICD-10-CM | POA: Diagnosis not present

## 2016-04-20 DIAGNOSIS — E11621 Type 2 diabetes mellitus with foot ulcer: Secondary | ICD-10-CM | POA: Insufficient documentation

## 2016-04-24 DIAGNOSIS — E11621 Type 2 diabetes mellitus with foot ulcer: Secondary | ICD-10-CM | POA: Diagnosis not present

## 2016-04-27 ENCOUNTER — Encounter (HOSPITAL_BASED_OUTPATIENT_CLINIC_OR_DEPARTMENT_OTHER): Payer: No Typology Code available for payment source

## 2016-04-28 ENCOUNTER — Other Ambulatory Visit: Payer: Self-pay | Admitting: Internal Medicine

## 2016-04-28 ENCOUNTER — Ambulatory Visit (HOSPITAL_COMMUNITY)
Admission: RE | Admit: 2016-04-28 | Discharge: 2016-04-28 | Disposition: A | Discharge status: Home / Self Care | Payer: No Typology Code available for payment source | Source: Ambulatory Visit | Attending: Internal Medicine | Admitting: Internal Medicine

## 2016-04-28 DIAGNOSIS — L97519 Non-pressure chronic ulcer of other part of right foot with unspecified severity: Secondary | ICD-10-CM | POA: Insufficient documentation

## 2016-04-28 DIAGNOSIS — M7731 Calcaneal spur, right foot: Secondary | ICD-10-CM | POA: Insufficient documentation

## 2016-04-28 DIAGNOSIS — M12871 Other specific arthropathies, not elsewhere classified, right ankle and foot: Secondary | ICD-10-CM | POA: Diagnosis not present

## 2016-04-28 DIAGNOSIS — E11621 Type 2 diabetes mellitus with foot ulcer: Secondary | ICD-10-CM | POA: Diagnosis not present

## 2016-05-05 ENCOUNTER — Encounter (HOSPITAL_BASED_OUTPATIENT_CLINIC_OR_DEPARTMENT_OTHER): Payer: No Typology Code available for payment source | Attending: Internal Medicine

## 2016-05-05 DIAGNOSIS — I252 Old myocardial infarction: Secondary | ICD-10-CM | POA: Diagnosis not present

## 2016-05-05 DIAGNOSIS — L97512 Non-pressure chronic ulcer of other part of right foot with fat layer exposed: Secondary | ICD-10-CM | POA: Insufficient documentation

## 2016-05-05 DIAGNOSIS — I10 Essential (primary) hypertension: Secondary | ICD-10-CM | POA: Insufficient documentation

## 2016-05-05 DIAGNOSIS — I110 Hypertensive heart disease with heart failure: Secondary | ICD-10-CM | POA: Diagnosis not present

## 2016-05-05 DIAGNOSIS — E11621 Type 2 diabetes mellitus with foot ulcer: Secondary | ICD-10-CM | POA: Diagnosis not present

## 2016-05-05 DIAGNOSIS — E1140 Type 2 diabetes mellitus with diabetic neuropathy, unspecified: Secondary | ICD-10-CM | POA: Insufficient documentation

## 2016-05-05 DIAGNOSIS — G4730 Sleep apnea, unspecified: Secondary | ICD-10-CM | POA: Insufficient documentation

## 2016-05-12 DIAGNOSIS — E11621 Type 2 diabetes mellitus with foot ulcer: Secondary | ICD-10-CM | POA: Diagnosis not present

## 2016-05-19 DIAGNOSIS — E11621 Type 2 diabetes mellitus with foot ulcer: Secondary | ICD-10-CM | POA: Diagnosis not present

## 2016-05-22 DIAGNOSIS — Z006 Encounter for examination for normal comparison and control in clinical research program: Secondary | ICD-10-CM

## 2016-05-26 DIAGNOSIS — E11621 Type 2 diabetes mellitus with foot ulcer: Secondary | ICD-10-CM | POA: Diagnosis not present

## 2016-05-30 ENCOUNTER — Telehealth: Payer: Self-pay

## 2016-05-30 ENCOUNTER — Ambulatory Visit (HOSPITAL_COMMUNITY): Admission: RE | Admit: 2016-05-30 | Payer: No Typology Code available for payment source | Source: Ambulatory Visit

## 2016-05-30 NOTE — Telephone Encounter (Signed)
Spoke to patient regarding Research appointment and Echo that is scheduled for today. Unfortunately, he was on the highway under very noisy conditions and ask that I call him back at a later time.

## 2016-06-02 ENCOUNTER — Encounter (HOSPITAL_BASED_OUTPATIENT_CLINIC_OR_DEPARTMENT_OTHER): Payer: No Typology Code available for payment source | Attending: Internal Medicine

## 2016-06-02 DIAGNOSIS — E11621 Type 2 diabetes mellitus with foot ulcer: Secondary | ICD-10-CM | POA: Diagnosis not present

## 2016-06-02 DIAGNOSIS — G4730 Sleep apnea, unspecified: Secondary | ICD-10-CM | POA: Insufficient documentation

## 2016-06-02 DIAGNOSIS — I252 Old myocardial infarction: Secondary | ICD-10-CM | POA: Insufficient documentation

## 2016-06-02 DIAGNOSIS — I10 Essential (primary) hypertension: Secondary | ICD-10-CM | POA: Diagnosis not present

## 2016-06-02 DIAGNOSIS — L97512 Non-pressure chronic ulcer of other part of right foot with fat layer exposed: Secondary | ICD-10-CM | POA: Diagnosis not present

## 2016-06-08 DIAGNOSIS — E11621 Type 2 diabetes mellitus with foot ulcer: Secondary | ICD-10-CM | POA: Diagnosis not present

## 2016-06-11 ENCOUNTER — Inpatient Hospital Stay (HOSPITAL_BASED_OUTPATIENT_CLINIC_OR_DEPARTMENT_OTHER)
Admission: EM | Admit: 2016-06-11 | Discharge: 2016-06-13 | DRG: 291 | Disposition: A | Discharge status: Home / Self Care | Payer: No Typology Code available for payment source | Attending: Internal Medicine | Admitting: Internal Medicine

## 2016-06-11 ENCOUNTER — Encounter (HOSPITAL_BASED_OUTPATIENT_CLINIC_OR_DEPARTMENT_OTHER): Payer: Self-pay | Admitting: Emergency Medicine

## 2016-06-11 ENCOUNTER — Emergency Department (HOSPITAL_BASED_OUTPATIENT_CLINIC_OR_DEPARTMENT_OTHER): Payer: No Typology Code available for payment source

## 2016-06-11 DIAGNOSIS — I110 Hypertensive heart disease with heart failure: Secondary | ICD-10-CM | POA: Diagnosis present

## 2016-06-11 DIAGNOSIS — Z8673 Personal history of transient ischemic attack (TIA), and cerebral infarction without residual deficits: Secondary | ICD-10-CM

## 2016-06-11 DIAGNOSIS — R0602 Shortness of breath: Secondary | ICD-10-CM | POA: Diagnosis present

## 2016-06-11 DIAGNOSIS — Z6837 Body mass index (BMI) 37.0-37.9, adult: Secondary | ICD-10-CM

## 2016-06-11 DIAGNOSIS — E11621 Type 2 diabetes mellitus with foot ulcer: Secondary | ICD-10-CM | POA: Diagnosis present

## 2016-06-11 DIAGNOSIS — I5041 Acute combined systolic (congestive) and diastolic (congestive) heart failure: Secondary | ICD-10-CM

## 2016-06-11 DIAGNOSIS — I10 Essential (primary) hypertension: Secondary | ICD-10-CM | POA: Diagnosis present

## 2016-06-11 DIAGNOSIS — T500X6A Underdosing of mineralocorticoids and their antagonists, initial encounter: Secondary | ICD-10-CM | POA: Diagnosis present

## 2016-06-11 DIAGNOSIS — Z91128 Patient's intentional underdosing of medication regimen for other reason: Secondary | ICD-10-CM | POA: Diagnosis not present

## 2016-06-11 DIAGNOSIS — E1110 Type 2 diabetes mellitus with ketoacidosis without coma: Secondary | ICD-10-CM | POA: Diagnosis present

## 2016-06-11 DIAGNOSIS — Z833 Family history of diabetes mellitus: Secondary | ICD-10-CM

## 2016-06-11 DIAGNOSIS — G4733 Obstructive sleep apnea (adult) (pediatric): Secondary | ICD-10-CM | POA: Diagnosis present

## 2016-06-11 DIAGNOSIS — I509 Heart failure, unspecified: Secondary | ICD-10-CM

## 2016-06-11 DIAGNOSIS — T383X6A Underdosing of insulin and oral hypoglycemic [antidiabetic] drugs, initial encounter: Secondary | ICD-10-CM | POA: Diagnosis present

## 2016-06-11 DIAGNOSIS — E669 Obesity, unspecified: Secondary | ICD-10-CM | POA: Diagnosis present

## 2016-06-11 DIAGNOSIS — R778 Other specified abnormalities of plasma proteins: Secondary | ICD-10-CM | POA: Diagnosis present

## 2016-06-11 DIAGNOSIS — Z8249 Family history of ischemic heart disease and other diseases of the circulatory system: Secondary | ICD-10-CM

## 2016-06-11 DIAGNOSIS — I4891 Unspecified atrial fibrillation: Secondary | ICD-10-CM | POA: Diagnosis present

## 2016-06-11 DIAGNOSIS — J9601 Acute respiratory failure with hypoxia: Secondary | ICD-10-CM | POA: Diagnosis not present

## 2016-06-11 DIAGNOSIS — E785 Hyperlipidemia, unspecified: Secondary | ICD-10-CM | POA: Diagnosis present

## 2016-06-11 DIAGNOSIS — R509 Fever, unspecified: Secondary | ICD-10-CM | POA: Diagnosis not present

## 2016-06-11 DIAGNOSIS — F329 Major depressive disorder, single episode, unspecified: Secondary | ICD-10-CM | POA: Diagnosis present

## 2016-06-11 DIAGNOSIS — Z86010 Personal history of colonic polyps: Secondary | ICD-10-CM | POA: Diagnosis not present

## 2016-06-11 DIAGNOSIS — E1165 Type 2 diabetes mellitus with hyperglycemia: Secondary | ICD-10-CM | POA: Diagnosis present

## 2016-06-11 DIAGNOSIS — L97419 Non-pressure chronic ulcer of right heel and midfoot with unspecified severity: Secondary | ICD-10-CM | POA: Diagnosis present

## 2016-06-11 DIAGNOSIS — J159 Unspecified bacterial pneumonia: Secondary | ICD-10-CM | POA: Diagnosis not present

## 2016-06-11 DIAGNOSIS — I5023 Acute on chronic systolic (congestive) heart failure: Secondary | ICD-10-CM | POA: Diagnosis present

## 2016-06-11 LAB — COMPREHENSIVE METABOLIC PANEL
ALBUMIN: 3.6 g/dL (ref 3.5–5.0)
ALT: 12 U/L — ABNORMAL LOW (ref 17–63)
ANION GAP: 9 (ref 5–15)
AST: 20 U/L (ref 15–41)
Alkaline Phosphatase: 93 U/L (ref 38–126)
BILIRUBIN TOTAL: 0.5 mg/dL (ref 0.3–1.2)
BUN: 9 mg/dL (ref 6–20)
CO2: 25 mmol/L (ref 22–32)
Calcium: 8.9 mg/dL (ref 8.9–10.3)
Chloride: 104 mmol/L (ref 101–111)
Creatinine, Ser: 0.79 mg/dL (ref 0.61–1.24)
GFR calc Af Amer: >60 mL/min (ref >60)
GFR calc non Af Amer: >60 mL/min (ref >60)
GLUCOSE: 352 mg/dL — AB (ref 65–99)
POTASSIUM: 3.6 mmol/L (ref 3.5–5.1)
SODIUM: 138 mmol/L (ref 135–145)
TOTAL PROTEIN: 7.3 g/dL (ref 6.5–8.1)

## 2016-06-11 LAB — CBC WITH DIFFERENTIAL/PLATELET
BASOS PCT: 0 %
Basophils Absolute: 0 10*3/uL (ref 0.0–0.1)
EOS ABS: 0.1 10*3/uL (ref 0.0–0.7)
Eosinophils Relative: 1 %
HEMATOCRIT: 37 % — AB (ref 39.0–52.0)
Hemoglobin: 12.4 g/dL — ABNORMAL LOW (ref 13.0–17.0)
Lymphocytes Relative: 10 %
Lymphs Abs: 1.2 10*3/uL (ref 0.7–4.0)
MCH: 29.9 pg (ref 26.0–34.0)
MCHC: 33.5 g/dL (ref 30.0–36.0)
MCV: 89.2 fL (ref 78.0–100.0)
MONO ABS: 0.7 10*3/uL (ref 0.1–1.0)
MONOS PCT: 6 %
Neutro Abs: 9.1 10*3/uL — ABNORMAL HIGH (ref 1.7–7.7)
Neutrophils Relative %: 83 %
Platelets: 160 10*3/uL (ref 150–400)
RBC: 4.15 MIL/uL — ABNORMAL LOW (ref 4.22–5.81)
RDW: 12.2 % (ref 11.5–15.5)
WBC: 11 10*3/uL — ABNORMAL HIGH (ref 4.0–10.5)

## 2016-06-11 LAB — CBC
HCT: 36.2 % — ABNORMAL LOW (ref 39.0–52.0)
Hemoglobin: 12.1 g/dL — ABNORMAL LOW (ref 13.0–17.0)
MCH: 29.5 pg (ref 26.0–34.0)
MCHC: 33.4 g/dL (ref 30.0–36.0)
MCV: 88.3 fL (ref 78.0–100.0)
PLATELETS: 173 10*3/uL (ref 150–400)
RBC: 4.1 MIL/uL — ABNORMAL LOW (ref 4.22–5.81)
RDW: 12.8 % (ref 11.5–15.5)
WBC: 12 10*3/uL — ABNORMAL HIGH (ref 4.0–10.5)

## 2016-06-11 LAB — CREATININE, SERUM
CREATININE: 0.87 mg/dL (ref 0.61–1.24)
GFR calc Af Amer: >60 mL/min (ref >60)
GFR calc non Af Amer: >60 mL/min (ref >60)

## 2016-06-11 LAB — TROPONIN I
TROPONIN I: 0.04 ng/mL — AB (ref <0.03)
TROPONIN I: 0.32 ng/mL — AB (ref <0.03)
Troponin I: 0.24 ng/mL (ref <0.03)
Troponin I: 0.36 ng/mL (ref <0.03)

## 2016-06-11 LAB — BRAIN NATRIURETIC PEPTIDE: B NATRIURETIC PEPTIDE 5: 266.9 pg/mL — AB (ref 0.0–100.0)

## 2016-06-11 LAB — DIGOXIN LEVEL

## 2016-06-11 LAB — GLUCOSE, CAPILLARY
GLUCOSE-CAPILLARY: 296 mg/dL — AB (ref 65–99)
GLUCOSE-CAPILLARY: 287 mg/dL — AB (ref 65–99)
Glucose-Capillary: 331 mg/dL — ABNORMAL HIGH (ref 65–99)
Glucose-Capillary: 241 mg/dL — ABNORMAL HIGH (ref 65–99)

## 2016-06-11 LAB — RAPID URINE DRUG SCREEN, HOSP PERFORMED
AMPHETAMINES: NOT DETECTED
Barbiturates: NOT DETECTED
Benzodiazepines: NOT DETECTED
Cocaine: NOT DETECTED
OPIATES: NOT DETECTED
Tetrahydrocannabinol: NOT DETECTED

## 2016-06-11 LAB — CBG MONITORING, ED
GLUCOSE-CAPILLARY: 347 mg/dL — AB (ref 65–99)
Glucose-Capillary: 357 mg/dL — ABNORMAL HIGH (ref 65–99)

## 2016-06-11 LAB — MRSA PCR SCREENING: MRSA BY PCR: NEGATIVE

## 2016-06-11 LAB — MAGNESIUM: MAGNESIUM: 1.4 mg/dL — AB (ref 1.7–2.4)

## 2016-06-11 LAB — PHOSPHORUS: Phosphorus: 3.4 mg/dL (ref 2.5–4.6)

## 2016-06-11 MED ORDER — DEXTROSE 5 % IV SOLN
1.0000 g | INTRAVENOUS | Status: DC
  Administered 2016-06-12 – 2016-06-13 (×2): 1 g via INTRAVENOUS
  Filled 2016-06-11 (×3): qty 10

## 2016-06-11 MED ORDER — NITROGLYCERIN 2 % TD OINT
0.5000 [in_us] | TOPICAL_OINTMENT | Freq: Three times a day (TID) | TRANSDERMAL | Status: DC
  Administered 2016-06-11 – 2016-06-12 (×4): 0.5 [in_us] via TOPICAL
  Filled 2016-06-11: qty 30
  Filled 2016-06-11: qty 1

## 2016-06-11 MED ORDER — AZITHROMYCIN 500 MG PO TABS
500.0000 mg | ORAL_TABLET | Freq: Every day | ORAL | Status: DC
  Administered 2016-06-12 – 2016-06-13 (×2): 500 mg via ORAL
  Filled 2016-06-11 (×3): qty 1

## 2016-06-11 MED ORDER — DEXTROSE 5 % IV SOLN
1.0000 g | Freq: Once | INTRAVENOUS | Status: AC
  Administered 2016-06-11: 1 g via INTRAVENOUS
  Filled 2016-06-11: qty 10

## 2016-06-11 MED ORDER — QUETIAPINE FUMARATE 50 MG PO TABS: 150.0000 mg | ORAL_TABLET | Freq: Two times a day (BID) | ORAL | Status: DC | PRN

## 2016-06-11 MED ORDER — IOPAMIDOL (ISOVUE-370) INJECTION 76%
100.0000 mL | Freq: Once | INTRAVENOUS | Status: AC | PRN
  Administered 2016-06-11: 100 mL via INTRAVENOUS

## 2016-06-11 MED ORDER — SODIUM CHLORIDE 0.9% FLUSH: 3.0000 mL | INTRAVENOUS | Status: DC | PRN

## 2016-06-11 MED ORDER — INSULIN ASPART 100 UNIT/ML ~~LOC~~ SOLN
0.0000 [IU] | Freq: Three times a day (TID) | SUBCUTANEOUS | Status: DC
  Administered 2016-06-11: 7 [IU] via SUBCUTANEOUS
  Administered 2016-06-11 – 2016-06-12 (×2): 11 [IU] via SUBCUTANEOUS
  Administered 2016-06-12: 7 [IU] via SUBCUTANEOUS
  Administered 2016-06-12 – 2016-06-13 (×3): 11 [IU] via SUBCUTANEOUS

## 2016-06-11 MED ORDER — ACETAMINOPHEN 325 MG PO TABS: 650.0000 mg | ORAL_TABLET | ORAL | Status: DC | PRN

## 2016-06-11 MED ORDER — ACETAMINOPHEN 325 MG PO TABS
ORAL_TABLET | ORAL | Status: AC
  Filled 2016-06-11: qty 2

## 2016-06-11 MED ORDER — HYDRALAZINE HCL 20 MG/ML IJ SOLN: 10.0000 mg | Freq: Three times a day (TID) | INTRAMUSCULAR | Status: DC | PRN

## 2016-06-11 MED ORDER — INSULIN REGULAR HUMAN 100 UNIT/ML IJ SOLN
3.0000 [IU] | Freq: Once | INTRAMUSCULAR | Status: AC
  Administered 2016-06-11: 3 [IU] via SUBCUTANEOUS
  Filled 2016-06-11: qty 1

## 2016-06-11 MED ORDER — ONDANSETRON HCL 4 MG/2ML IJ SOLN
4.0000 mg | Freq: Once | INTRAMUSCULAR | Status: AC
  Administered 2016-06-11: 4 mg via INTRAVENOUS
  Filled 2016-06-11: qty 2

## 2016-06-11 MED ORDER — MAGNESIUM SULFATE 2 GM/50ML IV SOLN
2.0000 g | Freq: Once | INTRAVENOUS | Status: AC
  Administered 2016-06-11: 2 g via INTRAVENOUS
  Filled 2016-06-11: qty 50

## 2016-06-11 MED ORDER — SODIUM CHLORIDE 0.9% FLUSH
3.0000 mL | Freq: Two times a day (BID) | INTRAVENOUS | Status: DC
  Administered 2016-06-11 (×2): 3 mL via INTRAVENOUS

## 2016-06-11 MED ORDER — DILTIAZEM LOAD VIA INFUSION
10.0000 mg | Freq: Once | INTRAVENOUS | Status: AC
  Administered 2016-06-11: 10 mg via INTRAVENOUS
  Filled 2016-06-11: qty 10

## 2016-06-11 MED ORDER — SODIUM CHLORIDE 0.9 % IV SOLN: 250.0000 mL | INTRAVENOUS | Status: DC | PRN

## 2016-06-11 MED ORDER — ZOLPIDEM TARTRATE 5 MG PO TABS
5.0000 mg | ORAL_TABLET | Freq: Once | ORAL | Status: AC
  Administered 2016-06-11: 5 mg via ORAL
  Filled 2016-06-11: qty 1

## 2016-06-11 MED ORDER — TRAZODONE HCL 50 MG PO TABS: 50.0000 mg | ORAL_TABLET | Freq: Every evening | ORAL | Status: DC | PRN

## 2016-06-11 MED ORDER — ONDANSETRON HCL 4 MG/2ML IJ SOLN: 4.0000 mg | Freq: Four times a day (QID) | INTRAMUSCULAR | Status: DC | PRN

## 2016-06-11 MED ORDER — ALBUTEROL SULFATE (2.5 MG/3ML) 0.083% IN NEBU: 2.5000 mg | INHALATION_SOLUTION | RESPIRATORY_TRACT | Status: DC | PRN

## 2016-06-11 MED ORDER — DEXTROSE 5 % IV SOLN
5.0000 mg/h | INTRAVENOUS | Status: DC
  Administered 2016-06-11: 10 mg/h via INTRAVENOUS

## 2016-06-11 MED ORDER — AZITHROMYCIN 500 MG IV SOLR
INTRAVENOUS | Status: AC
  Administered 2016-06-11: 08:00:00
  Filled 2016-06-11: qty 500

## 2016-06-11 MED ORDER — ORAL CARE MOUTH RINSE
15.0000 mL | Freq: Two times a day (BID) | OROMUCOSAL | Status: DC
  Administered 2016-06-11 – 2016-06-12 (×3): 15 mL via OROMUCOSAL

## 2016-06-11 MED ORDER — FUROSEMIDE 10 MG/ML IJ SOLN
40.0000 mg | Freq: Once | INTRAMUSCULAR | Status: AC
  Administered 2016-06-11: 40 mg via INTRAVENOUS
  Filled 2016-06-11: qty 4

## 2016-06-11 MED ORDER — HEPARIN SODIUM (PORCINE) 5000 UNIT/ML IJ SOLN
5000.0000 [IU] | Freq: Three times a day (TID) | INTRAMUSCULAR | Status: DC
  Administered 2016-06-11 – 2016-06-13 (×6): 5000 [IU] via SUBCUTANEOUS
  Filled 2016-06-11 (×6): qty 1

## 2016-06-11 MED ORDER — FUROSEMIDE 10 MG/ML IJ SOLN
80.0000 mg | Freq: Once | INTRAMUSCULAR | Status: AC
  Administered 2016-06-11: 80 mg via INTRAVENOUS
  Filled 2016-06-11: qty 8

## 2016-06-11 MED ORDER — DILTIAZEM HCL 25 MG/5ML IV SOLN
INTRAVENOUS | Status: AC
  Administered 2016-06-11: 10 mg
  Filled 2016-06-11: qty 5

## 2016-06-11 MED ORDER — ACETAMINOPHEN 325 MG PO TABS
650.0000 mg | ORAL_TABLET | Freq: Once | ORAL | Status: AC
  Administered 2016-06-11: 650 mg via ORAL

## 2016-06-11 MED ORDER — DEXTROSE 5 % IV SOLN
500.0000 mg | Freq: Once | INTRAVENOUS | Status: AC
  Administered 2016-06-11: 500 mg via INTRAVENOUS

## 2016-06-11 MED ORDER — DILTIAZEM HCL-DEXTROSE 100-5 MG/100ML-% IV SOLN (PREMIX)
INTRAVENOUS | Status: AC
  Administered 2016-06-11: 10 mg via INTRAVENOUS
  Filled 2016-06-11: qty 100

## 2016-06-11 NOTE — ED Notes (Signed)
Attempted report 

## 2016-06-11 NOTE — ED Triage Notes (Addendum)
Pt c/o feeling sob for past 3 days but worse tonight. Denies any fevers. C/o anterior chest tightness. States hx of CHF. Pt is unable to lay flat due to sob. Pt states dry cough that is constant and makes it difficult to breath. resp even but  labored on arrival. 02 sats 92% on RA  Pt states he has been out of his medications since Tuesday. Pt with a boot to his right lower leg. States he is seen at the wound center for diabetic ulcer.

## 2016-06-11 NOTE — ED Notes (Signed)
CBG:  347 

## 2016-06-11 NOTE — ED Notes (Signed)
Dr. Palumbo at [facility] 

## 2016-06-11 NOTE — ED Provider Notes (Signed)
[facility] Provider Note   CSN: [id number] Arrival date & time: 06/11/16  0341     History   Chief Complaint No chief complaint on file.   HPI Mitchell [name] is a 45 y.o. male.  The history is provided by the patient.  Shortness of Breath  This is a recurrent problem. The average episode lasts 3 days. The problem occurs continuously.The current episode started yesterday. The problem has been gradually worsening. Associated symptoms include PND and orthopnea. Pertinent negatives include no fever, no cough, no wheezing, no chest pain and no leg swelling. It is unknown what precipitated the problem. He has tried nothing for the symptoms. The treatment provided no relief. He has had prior hospitalizations. He has had prior ED visits. Associated medical issues include heart failure.    Past Medical History:  Diagnosis Date  . Bipolar disorder (HCC)   . CHF (congestive heart failure) (HCC)   . Chronic systolic heart failure (HCC) 11/2010   a. NICM 4/14 EF 20-25%, TR mild  . CVA (cerebral infarction)    No residual deficits  . Depression    PTSD,   . Diabetes mellitus    TYPE II; UNCONTROLLED BY HEMOGLOBIN A1c; STABLE AS  PER DISCHARGE  . Headache(784.0)   . 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 History of colonic polyps   . Hyperlipidemia   . Hypertension   . Myocardial infarction    at age 72 years old (while playing football)  . Non-ischemic cardiomyopathy (HCC)    No ischemia on myoview, showed EF of 42%. 2D echo showed EF 50-55% with diatolic dysfunction in 2009  . Obesity   . OSA (obstructive sleep apnea)    Being set up again for c-pap  . Pneumonia   . Post-cardiac injury syndrome (HCC)    History of cardiac injury from blunt trauma  . PVCs (premature ventricular contractions)   . Schizophrenia (HCC)    Goes to [facility]  . SOB (shortness of breath)   . Stroke [facility]) 2005   some left side weakness  . Syncope    Recurrent, thought  to be vasovagal. Also has h/o frequent PVCs.     Patient Active Problem List   Diagnosis Date Noted  . Type 2 diabetes mellitus with hyperglycemia (HCC) 12/07/2015  . Heart failure (HCC) 01/27/2014  . Noncompliance 01/22/2014  . Snores 01/22/2014  . DKA, type 2, not at goal [facility]) 01/08/2014  . Syncope 01/07/2014  . Acute respiratory failure with hypoxia (HCC) 11/25/2013  . CHF (congestive heart failure) (HCC) 11/22/2013  . PNA (pneumonia) 11/22/2013  . CAP (community acquired pneumonia) 07/22/2013  . Community acquired bacterial pneumonia 07/22/2013  . Chronic systolic heart failure (HCC) 10/02/2011  . Erectile dysfunction 10/02/2011  . Acute on chronic systolic heart failure (HCC) 09/23/2011  . Obstructive sleep apnea 10/11/2007  . Type II diabetes mellitus, uncontrolled 10/10/2007  . HYPERLIPIDEMIA 10/10/2007  . HYPOKALEMIA 10/10/2007  . OBESITY 10/10/2007  . Essential hypertension 10/10/2007  . PREMATURE VENTRICULAR CONTRACTIONS 10/10/2007  . SYNCOPE 10/10/2007  . COLONIC POLYPS, HX OF 10/10/2007    Past Surgical History:  Procedure Laterality Date  . CARDIAC CATHETERIZATION  12/19/10   DIFFUSE NONOBSTRUCTIVE CAD; NONISCHEMIC CARDIOMYOPATHY; LEFT VENTRICULAR ANGIOGRAM WAS PERFORMED SECONDARY TO  ELEVATED LEFT VENTRICULAR FILLING PRESSURES  . COLONOSCOPY W/ POLYPECTOMY    . MULTIPLE EXTRACTIONS WITH ALVEOLOPLASTY  01/27/2014   "all my teeth; 4 Quadrants of alveoloplasty  . MULTIPLE EXTRACTIONS WITH ALVEOLOPLASTY  N/A 01/27/2014   Procedure: EXTRACTION OF TEETH #'1, 2, 3, 4, 5, 6, 7, 8, 9, 10, 11, 12, 13, 14, 15, 16, 17, 20, 21, 22, 23, 24, 25, 26, 27, 28, 29, 31 and 32 WITH ALVEOLOPLASTY;  Surgeon: Charlynne Pander, DDS;  Location: [facility] OR;  Service: Oral Surgery;  Laterality: N/A;  . ORIF FINGER / THUMB FRACTURE Right        Home Medications    Prior to Admission medications   Medication Sig Start Date End Date Taking? Authorizing Provider  acetaminophen (TYLENOL) 500  MG tablet Take 1 tablet (500 mg total) by mouth every 6 (six) hours as needed. 06/21/15   Cheri Fowler, PA-C  atorvastatin (LIPITOR) 40 MG tablet Take 40 mg by mouth every evening. 09/24/15   Historical Provider, MD  carvedilol (COREG) 25 MG tablet Take 1 tablet (25 mg total) by mouth 2 (two) times daily with a meal. 12/09/14   John Molpus, MD  cephALEXin (KEFLEX) 500 MG capsule Take 1 capsule (500 mg total) by mouth 3 (three) times daily. 04/06/16   Fayrene Helper, PA-C  cyclobenzaprine (FLEXERIL) 5 MG tablet Take 1 tablet (5 mg total) by mouth 3 (three) times daily as needed for muscle spasms. 06/21/15   Cheri Fowler, PA-C  digoxin (LANOXIN) 0.25 MG tablet Take 1 tablet (0.25 mg total) by mouth daily. 12/09/14   John Molpus, MD  furosemide (LASIX) 40 MG tablet Take 1 tablet (40 mg total) by mouth 2 (two) times daily. 12/09/14   John Molpus, MD  HYDROcodone-acetaminophen (NORCO/VICODIN) 5-325 MG per tablet Take 1-2 tablets by mouth every 6 (six) hours as needed. 01/16/15   Ladona Mow, PA-C  insulin NPH-regular Human (NOVOLIN 70/30) (70-30) 100 UNIT/ML injection Inject 40 Units into the skin 2 (two) times daily with a meal. 04/06/16   Fayrene Helper, PA-C  lisinopril (ZESTRIL) 2.5 MG tablet Take 1 tablet (2.5 mg total) by mouth daily. 12/08/15   Mauricio Annett Gula, MD  methocarbamol (ROBAXIN) 500 MG tablet Take 1 tablet (500 mg total) by mouth 2 (two) times daily. 07/06/15   Samantha Tripp Dowless, PA-C  QUEtiapine (SEROQUEL) 100 MG tablet Take 1.5 tablets (150 mg total) by mouth at bedtime. 12/08/15   Mauricio Annett Gula, MD  spironolactone (ALDACTONE) 50 MG tablet Take 1 tablet (50 mg total) by mouth daily. 12/08/15   Mauricio Annett Gula, MD    Family History Family History  Problem Relation Age of Onset  . Heart disease Mother     MI  . Heart failure Mother   . Diabetes Mother     ALSO IN MOST OF HIS SIBLINGS; 2 UNLCES HAVE ALSO PASSED AWAY FROM DM  . Cardiomyopathy Mother   . Cancer - Ovarian Mother   .  Heart disease Father   . Hypertension Father   . Diabetes Father     Social History Social History  Substance Use Topics  . Smoking status: Never Smoker  . Smokeless tobacco: Never Used  . Alcohol use No     Allergies   Nsaids   Review of Systems Review of Systems  Constitutional: Negative for diaphoresis and fever.  Respiratory: Positive for shortness of breath. Negative for cough, wheezing and stridor.   Cardiovascular: Positive for orthopnea and PND. Negative for chest pain and leg swelling.  All other systems reviewed and are negative.    Physical Exam Updated Vital Signs There were no vitals taken for this visit.  Physical Exam  Constitutional: He is oriented to  person, place, and time. He appears well-developed.  HENT:  Head: Normocephalic and atraumatic.  Mouth/Throat: No oropharyngeal exudate.  Eyes: Conjunctivae are normal. Pupils are equal, round, and reactive to light.  Neck: Normal range of motion. Neck supple.  Cardiovascular: Regular rhythm.  Tachycardia present.   Pulmonary/Chest: No stridor. Tachypnea noted. He has decreased breath sounds. He exhibits no tenderness.  Abdominal: Soft. Bowel sounds are normal. There is no tenderness.  Musculoskeletal: Normal range of motion. He exhibits no deformity.  Neurological: He is alert and oriented to person, place, and time.  Skin: Skin is warm and dry. Capillary refill takes less than 2 seconds. He is not diaphoretic.  Psychiatric: He has a normal mood and affect.     ED Treatments / Results   Vitals:   06/11/16 0530 06/11/16 0605  BP: (!) 150/101 155/99  Pulse: 108 107  Resp: (!) 30 13  Temp:      Procedures Procedures (including critical care time)  Medications Ordered in ED   EKG Interpretation  Date/Time:  Sunday June 11 2016 04:03:11 EST Ventricular Rate:  133 PR Interval:    QRS Duration: 116 QT Interval:  319 QTC Calculation: 475 R Axis:   -50 Text Interpretation:  Sinus  tachycardia Nonspecific IVCD with LAD Left ventricular hypertrophy Confirmed by [facility]  MD, Morene AntuAPRIL ([id number]) on 06/11/2016 4:14:17 AM      Results for orders placed or performed during the hospital encounter of 06/11/16  CBC with Differential/Platelet  Result Value Ref Range   WBC 11.0 (H) 4.0 - 10.5 K/uL   RBC 4.15 (L) 4.22 - 5.81 MIL/uL   Hemoglobin 12.4 (L) 13.0 - 17.0 g/dL   HCT 01.037.0 (L) 27.239.0 - 53.652.0 %   MCV 89.2 78.0 - 100.0 fL   MCH 29.9 26.0 - 34.0 pg   MCHC 33.5 30.0 - 36.0 g/dL   RDW 64.412.2 03.411.5 - 74.215.5 %   Platelets 160 150 - 400 K/uL   Neutrophils Relative % 83 %   Neutro Abs 9.1 (H) 1.7 - 7.7 K/uL   Lymphocytes Relative 10 %   Lymphs Abs 1.2 0.7 - 4.0 K/uL   Monocytes Relative 6 %   Monocytes Absolute 0.7 0.1 - 1.0 K/uL   Eosinophils Relative 1 %   Eosinophils Absolute 0.1 0.0 - 0.7 K/uL   Basophils Relative 0 %   Basophils Absolute 0.0 0.0 - 0.1 K/uL  Comprehensive metabolic panel  Result Value Ref Range   Sodium 138 135 - 145 mmol/L   Potassium 3.6 3.5 - 5.1 mmol/L   Chloride 104 101 - 111 mmol/L   CO2 25 22 - 32 mmol/L   Glucose, Bld 352 (H) 65 - 99 mg/dL   BUN 9 6 - 20 mg/dL   Creatinine, Ser 5.950.79 0.61 - 1.24 mg/dL   Calcium 8.9 8.9 - 63.810.3 mg/dL   Total Protein 7.3 6.5 - 8.1 g/dL   Albumin 3.6 3.5 - 5.0 g/dL   AST 20 15 - 41 U/L   ALT 12 (L) 17 - 63 U/L   Alkaline Phosphatase 93 38 - 126 U/L   Total Bilirubin 0.5 0.3 - 1.2 mg/dL   GFR calc non Af Amer >60 >60 mL/min   GFR calc Af Amer >60 >60 mL/min   Anion gap 9 5 - 15  Troponin I  Result Value Ref Range   Troponin I 0.04 (HH) <0.03 ng/mL  Brain natriuretic peptide  Result Value Ref Range   B Natriuretic Peptide 266.9 (H) 0.0 -  100.0 pg/mL  Rapid urine drug screen (hospital performed)  Result Value Ref Range   Opiates NONE DETECTED NONE DETECTED   Cocaine NONE DETECTED NONE DETECTED   Benzodiazepines NONE DETECTED NONE DETECTED   Amphetamines NONE DETECTED NONE DETECTED   Tetrahydrocannabinol  NONE DETECTED NONE DETECTED   Barbiturates NONE DETECTED NONE DETECTED  POC CBG, ED  Result Value Ref Range   Glucose-Capillary 347 (H) 65 - 99 mg/dL   Ct Angio Chest Pe W And/or Wo Contrast  Result Date: 06/11/2016 CLINICAL DATA:  Shortness of breath for 3 days but worse tonight. Elevated troponin. History of stroke, congestive heart failure, diabetes, hypertension, PVC, cardiac catheterization. EXAM: CT ANGIOGRAPHY CHEST WITH CONTRAST TECHNIQUE: Multidetector CT imaging of the chest was performed using the standard protocol during bolus administration of intravenous contrast. Multiplanar CT image reconstructions and MIPs were obtained to evaluate the vascular anatomy. CONTRAST:  100 mL Isovue 370 COMPARISON:  None. FINDINGS: Cardiovascular: Examination is somewhat limited by contrast bolus and motion artifact but no significant pulmonary emboli are demonstrated. Normal heart size. Small pericardial effusion. Normal caliber thoracic aorta. Mediastinum/Nodes: Mediastinal lymph nodes are not pathologically enlarged. Esophagus is decompressed. Thyroid gland is unremarkable. Lungs/Pleura: Small bilateral pleural effusions with basilar atelectasis. Mosaic attenuation pattern to the lungs may indicate pulmonary edema. Vague nodular ground-glass infiltrative pattern to the right lung is probably inflammatory. Consider pneumonia or atypical infectious process. No pneumothorax. Airways are patent. Upper Abdomen: No acute abnormality. Musculoskeletal: No chest wall abnormality. No acute or significant osseous findings. Review of the MIP images confirms the above findings. IMPRESSION: No evidence of significant pulmonary embolus. Small pericardial effusion. Small bilateral pleural effusions with basilar atelectasis. Probable bilateral edema. Patchy nodular ground-glass infiltration in the right lung probably represents pneumonia or atypical infectious process. Electronically Signed   By: Burman Nieves M.D.   On:  06/11/2016 06:14   Dg Chest Port 1 View  Result Date: 06/11/2016 CLINICAL DATA:  Shortness of breath for 3 days, worse tonight. EXAM: PORTABLE CHEST 1 VIEW COMPARISON:  12/06/2015 FINDINGS: Cardiac enlargement with mild pulmonary vascular congestion. Slight interstitial changes in the bases suggesting early edema. No focal consolidation. No pleural effusions. No pneumothorax. Mediastinal contours appear intact. IMPRESSION: Cardiac enlargement with mild pulmonary vascular congestion and mild interstitial edema. Electronically Signed   By: Burman Nieves M.D.   On: 06/11/2016 04:44   Medications  diltiazem (CARDIZEM) 1 mg/mL load via infusion 10 mg (10 mg Intravenous New Bag/Given 06/11/16 0447)    And  diltiazem (CARDIZEM) 100 mg in dextrose 5 % 100 mL (1 mg/mL) infusion (10 mg/hr Intravenous Restarted 06/11/16 0608)  cefTRIAXone (ROCEPHIN) 1 g in dextrose 5 % 50 mL IVPB (not administered)  azithromycin (ZITHROMAX) 500 mg in dextrose 5 % 250 mL IVPB (not administered)  nitroGLYCERIN (NITROGLYN) 2 % ointment 0.5 inch (not administered)  insulin regular (NOVOLIN R,HUMULIN R) 100 units/mL injection 3 Units (not administered)  furosemide (LASIX) injection 40 mg (40 mg Intravenous Given 06/11/16 0436)  diltiazem (CARDIZEM) 25 MG/5ML injection (10 mg  Given 06/11/16 0447)  iopamidol (ISOVUE-370) 76 % injection 100 mL (100 mLs Intravenous Contrast Given 06/11/16 0549)     Will need inpatient stabilization on home regimen and repeat echo.     Cy Blamer, MD 06/11/16 0630

## 2016-06-11 NOTE — ED Notes (Signed)
Page made to Hospitalist via [place]. Spoke with [facility].

## 2016-06-11 NOTE — Progress Notes (Signed)
Patient's SPO2 is 91 % on RA.  Placed patient on 3 liter nasal cannula.  Patient's SPO2 increased and remains at 97%.

## 2016-06-11 NOTE — ED Notes (Signed)
Xray at BS 

## 2016-06-11 NOTE — Progress Notes (Addendum)
Pt. admitted to unit, rm 2H26 from [facility]. Oriented to room, call bell, Ascom phones and staff. Bed in low position. Fall safety plan reviewed, non-skid socks in place. Full assessment to Epic; skin assessed with Anderson Malta RN; per pt diabetic ulcer to R foot, dsg with air cast and ortho boot in place, WOC consult placed. Telemetry box verified with CCMD and French Ana RN: [id number]. VSS, 2L O2 [place]; ST 100s; dilt gtt infusing at 10mg . Pt in NAD. Will continue to monitor.

## 2016-06-11 NOTE — Progress Notes (Signed)
Patient accepted to inpatient status, Stepdown unit with telemetry monitoring for CHF exacerbation with possible atrial fibrillation (EKG in the system shows sinus tachycardia but ED attending has seen irregularity on telemetry at the bedside).  He is on a cardizem drip (Coreg and digoxin on his home med list).  Patient has a history of medical noncompliance.  Last EF around 30%.  He has responded well to lasix 40mg  IV x one; 1.5L output already.  CTA chest ordered to rule out PE but he has a small pericardial effusion, bilateral pleural effusion, and RLL ground glass infiltrate concerning for infectious process.  He has also received IV Rocephin and azithromycin.

## 2016-06-11 NOTE — ED Notes (Signed)
Pt to CT, alert, NAD, calm, "feeling better, breathing easier". VSS.

## 2016-06-11 NOTE — H&P (Addendum)
[facility] History and Physical  [name] VWP:[id number] DOB: Jul 31, 1971 DOA: 06/11/2016  Referring physician:  PCP: Caffie Damme, MD   Chief Complaint: "It got to the point that I couldn't lay flat."  HPI: Mitchell [name] is a 45 y.o. male with hx significant for CHF, depression, DM, HTN, MI & OSA who presented to [facility] with complaint of SOB. Patient had a disagreement with the staff at his cardiologist's office. He then fired his doctor. A few weeks later patient went to get refills of his heart medication and there were none available. Refills not requested and patient has been out of his medication for less than a week. Patient began to become symptomatic with shortness of breath 3 days ago. Also with a dry cough. Denies fevers, chills, nausea, vomiting. No sick contacts. Shortness of breath got to the point that patient couldn't lay flat when he tried to sleep so he had a friend bring him to the emergency room.  ED course: Patient given Nitropaste 0.5 inches along with Lasix 40 mg. Patient diuresed 1.5 L in the emergency room. Shortness of breath better. No patient was newly hypoxic on arrival at 91% with increased work of breathing. He did well through liters oxygen. CT chest showed pneumonia as well as pulmonary edema. Patient given azithromycin and Rocephin in the ED.   Review of Systems:  As per HPI otherwise 10 point review of systems negative.    Past Medical History:  Diagnosis Date  . Bipolar disorder (HCC)   . CHF (congestive heart failure) (HCC)   . Chronic systolic heart failure (HCC) 11/2010   a. NICM 4/14 EF 20-25%, TR mild  . CVA (cerebral infarction)    No residual deficits  . Depression    PTSD,   . Diabetes mellitus    TYPE II; UNCONTROLLED BY HEMOGLOBIN A1c; STABLE AS  PER DISCHARGE  . Headache(784.0)   . 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 History of colonic polyps   . Hyperlipidemia   . Hypertension   . Myocardial infarction    at age 46  years old (while playing football)  . Non-ischemic cardiomyopathy (HCC)    No ischemia on myoview, showed EF of 42%. 2D echo showed EF 50-55% with diatolic dysfunction in 2009  . Obesity   . OSA (obstructive sleep apnea)    Being set up again for c-pap  . Pneumonia   . Post-cardiac injury syndrome (HCC)    History of cardiac injury from blunt trauma  . PVCs (premature ventricular contractions)   . Schizophrenia (HCC)    Goes to [facility]  . SOB (shortness of breath)   . Stroke [facility]) 2005   some left side weakness  . Syncope    Recurrent, thought to be vasovagal. Also has h/o frequent PVCs.    Past Surgical History:  Procedure Laterality Date  . CARDIAC CATHETERIZATION  12/19/10   DIFFUSE NONOBSTRUCTIVE CAD; NONISCHEMIC CARDIOMYOPATHY; LEFT VENTRICULAR ANGIOGRAM WAS PERFORMED SECONDARY TO  ELEVATED LEFT VENTRICULAR FILLING PRESSURES  . COLONOSCOPY W/ POLYPECTOMY    . MULTIPLE EXTRACTIONS WITH ALVEOLOPLASTY  01/27/2014   "all my teeth; 4 Quadrants of alveoloplasty  . MULTIPLE EXTRACTIONS WITH ALVEOLOPLASTY N/A 01/27/2014   Procedure: EXTRACTION OF TEETH #'1, 2, 3, 4, 5, 6, 7, 8, 9, 10, 11, 12, 13, 14, 15, 16, 17, 20, 21, 22, 23, 24, 25, 26, 27, 28, 29, 31 and 32 WITH ALVEOLOPLASTY;  Surgeon: Charlynne Pander, DDS;  Location: [facility]  OR;  Service: Oral Surgery;  Laterality: N/A;  . ORIF FINGER / THUMB FRACTURE Right    Social History:  reports that he has never smoked. He has never used smokeless tobacco. He reports that he does not drink alcohol or use drugs.  Allergies  Allergen Reactions  . Nsaids Anaphylaxis    Family History  Problem Relation Age of Onset  . Heart disease Mother     MI  . Heart failure Mother   . Diabetes Mother     ALSO IN MOST OF HIS SIBLINGS; 2 UNLCES HAVE ALSO PASSED AWAY FROM DM  . Cardiomyopathy Mother   . Cancer - Ovarian Mother   . Heart disease Father   . Hypertension Father   . Diabetes Father     (*In process*) Prior to  Admission medications   Medication Sig Start Date End Date Taking? Authorizing Provider  losartan (COZAAR) 100 MG tablet Take 100 mg by mouth daily.   Yes Historical Provider, MD  acetaminophen (TYLENOL) 500 MG tablet Take 1 tablet (500 mg total) by mouth every 6 (six) hours as needed. 06/21/15   Cheri Fowler, PA-C  atorvastatin (LIPITOR) 40 MG tablet Take 40 mg by mouth every evening. 09/24/15   Historical Provider, MD  carvedilol (COREG) 25 MG tablet Take 1 tablet (25 mg total) by mouth 2 (two) times daily with a meal. 12/09/14   John Molpus, MD  cephALEXin (KEFLEX) 500 MG capsule Take 1 capsule (500 mg total) by mouth 3 (three) times daily. 04/06/16   Fayrene Helper, PA-C  cyclobenzaprine (FLEXERIL) 5 MG tablet Take 1 tablet (5 mg total) by mouth 3 (three) times daily as needed for muscle spasms. 06/21/15   Cheri Fowler, PA-C  digoxin (LANOXIN) 0.25 MG tablet Take 1 tablet (0.25 mg total) by mouth daily. 12/09/14   John Molpus, MD  furosemide (LASIX) 40 MG tablet Take 1 tablet (40 mg total) by mouth 2 (two) times daily. 12/09/14   John Molpus, MD  HYDROcodone-acetaminophen (NORCO/VICODIN) 5-325 MG per tablet Take 1-2 tablets by mouth every 6 (six) hours as needed. 01/16/15   Ladona Mow, PA-C  insulin NPH-regular Human (NOVOLIN 70/30) (70-30) 100 UNIT/ML injection Inject 40 Units into the skin 2 (two) times daily with a meal. 04/06/16   Fayrene Helper, PA-C  lisinopril (ZESTRIL) 2.5 MG tablet Take 1 tablet (2.5 mg total) by mouth daily. 12/08/15   Mauricio Annett Gula, MD  methocarbamol (ROBAXIN) 500 MG tablet Take 1 tablet (500 mg total) by mouth 2 (two) times daily. 07/06/15   Samantha Tripp Dowless, PA-C  QUEtiapine (SEROQUEL) 100 MG tablet Take 1.5 tablets (150 mg total) by mouth at bedtime. 12/08/15   Mauricio Annett Gula, MD  spironolactone (ALDACTONE) 50 MG tablet Take 1 tablet (50 mg total) by mouth daily. 12/08/15   Mauricio Annett Gula, MD   Physical Exam: Vitals:   06/11/16 0700 06/11/16 0730 06/11/16  0800 06/11/16 0909  BP:  147/96 138/92   Pulse:  106 100   Resp:  18 19   Temp: 99.5 F (37.5 C)     TempSrc: Oral     SpO2:  96% 99%   Weight:    128.6 kg (283 lb 9.6 oz)  Height:    6' (1.829 m)    Wt Readings from Last 3 Encounters:  06/11/16 128.6 kg (283 lb 9.6 oz)  04/06/16 127.5 kg (281 lb)  12/08/15 127.8 kg (281 lb 12 oz)    General:  Appears calm and comfortable, Obese  Eyes:  PERRL, EOMI, normal lids, iris ENT:  grossly normal hearing, lips & tongue Neck:  no LAD, masses or thyromegaly Cardiovascular:  RRR, no m/r/g. No LE edema.  Respiratory:  CTA bilaterally, no w/r/r. Tachypnea with respiratory rate of 22 Abdomen:  soft, ntnd Skin:  no rash or induration seen on limited exam. Aircast on RLE for DM ulcer Musculoskeletal:  grossly normal tone BUE/BLE Psychiatric:  grossly normal mood and affect, speech fluent and appropriate Neurologic:  CN 2-12 grossly intact, moves all extremities in coordinated fashion. alert and oriented 3           Labs on Admission:  Basic Metabolic Panel:  Recent Labs Lab 06/11/16 0411  NA 138  K 3.6  CL 104  CO2 25  GLUCOSE 352*  BUN 9  CREATININE 0.79  CALCIUM 8.9   Liver Function Tests:  Recent Labs Lab 06/11/16 0411  AST 20  ALT 12*  ALKPHOS 93  BILITOT 0.5  PROT 7.3  ALBUMIN 3.6   No results for input(s): LIPASE, AMYLASE in the last 168 hours. No results for input(s): AMMONIA in the last 168 hours. CBC:  Recent Labs Lab 06/11/16 0411  WBC 11.0*  NEUTROABS 9.1*  HGB 12.4*  HCT 37.0*  MCV 89.2  PLT 160   Cardiac Enzymes:  Recent Labs Lab 06/11/16 0411  TROPONINI 0.04*    BNP (last 3 results)  Recent Labs  10/30/15 0135 12/06/15 2033 06/11/16 0411  BNP 153.6* 368.4* 266.9*    ProBNP (last 3 results) No results for input(s): PROBNP in the last 8760 hours.   Serum creatinine: 0.79 mg/dL 16/04/9610/12/17 [id number] Estimated creatinine clearance: 158.2 mL/min  CBG:  Recent Labs Lab 06/11/16 0509  06/11/16 0821  GLUCAP 347* 357*    Radiological Exams on Admission: Ct Angio Chest Pe W And/or Wo Contrast  Result Date: 06/11/2016 CLINICAL DATA:  Shortness of breath for 3 days but worse tonight. Elevated troponin. History of stroke, congestive heart failure, diabetes, hypertension, PVC, cardiac catheterization. EXAM: CT ANGIOGRAPHY CHEST WITH CONTRAST TECHNIQUE: Multidetector CT imaging of the chest was performed using the standard protocol during bolus administration of intravenous contrast. Multiplanar CT image reconstructions and MIPs were obtained to evaluate the vascular anatomy. CONTRAST:  100 mL Isovue 370 COMPARISON:  None. FINDINGS: Cardiovascular: Examination is somewhat limited by contrast bolus and motion artifact but no significant pulmonary emboli are demonstrated. Normal heart size. Small pericardial effusion. Normal caliber thoracic aorta. Mediastinum/Nodes: Mediastinal lymph nodes are not pathologically enlarged. Esophagus is decompressed. Thyroid gland is unremarkable. Lungs/Pleura: Small bilateral pleural effusions with basilar atelectasis. Mosaic attenuation pattern to the lungs may indicate pulmonary edema. Vague nodular ground-glass infiltrative pattern to the right lung is probably inflammatory. Consider pneumonia or atypical infectious process. No pneumothorax. Airways are patent. Upper Abdomen: No acute abnormality. Musculoskeletal: No chest wall abnormality. No acute or significant osseous findings. Review of the MIP images confirms the above findings. IMPRESSION: No evidence of significant pulmonary embolus. Small pericardial effusion. Small bilateral pleural effusions with basilar atelectasis. Probable bilateral edema. Patchy nodular ground-glass infiltration in the right lung probably represents pneumonia or atypical infectious process. Electronically Signed   By: Burman NievesWilliam  Stevens M.D.   On: 06/11/2016 06:14   Dg Chest Port 1 View  Result Date: 06/11/2016 CLINICAL DATA:   Shortness of breath for 3 days, worse tonight. EXAM: PORTABLE CHEST 1 VIEW COMPARISON:  12/06/2015 FINDINGS: Cardiac enlargement with mild pulmonary vascular congestion. Slight interstitial changes in the bases suggesting early edema. No focal  consolidation. No pleural effusions. No pneumothorax. Mediastinal contours appear intact. IMPRESSION: Cardiac enlargement with mild pulmonary vascular congestion and mild interstitial edema. Electronically Signed   By: Burman Nieves M.D.   On: 06/11/2016 04:44    EKG: Independently reviewed. VR 133, PR <200, QRS 116, QTc 475 no stemi; compared to 11/2015 EKG, no acute findings  Assessment/Plan Principal Problem:   Acute respiratory failure with hypoxia (HCC) Active Problems:   HLD (hyperlipidemia)   Obstructive sleep apnea   Essential hypertension   Community acquired bacterial pneumonia   CHF (congestive heart failure) (HCC)   DKA, type 2, not at goal [facility])  Acute CHF exacerbation/Resp Failure with hypoxia Given lasix at outside ED along with nitro paste Continue lisinopril, Aldactone, Coreg, digoxin IV lasix for now, resume PO in AM Strict I&Os   Afib with RVR, new Not seen on EKG but reported by EDP Checking Mg & Phos Mild tropnemia of 0.04 on admit, will trend Cardizem Drip effective, will continue CXR in AM Cardio consult  CAP Likely pna on CT chest When necessary albuterol Antibiotic-rocephin and azithro starte din Ed will cont Oxygen therapy Continuous pulse oximetry  Hypertension When necessary hydralazine 10 mg IV as needed for severe blood pressure  DM  SSI  OSA RT to eval  Hyperlipidemia Continue statin  Status - inpt tele  Code Status: FULL DVT Prophylaxis: heparin Family Communication: family friend at bedside Disposition Plan: Pending Improvement    Haydee Salter, MD Family Medicine [facility] [email or web address] Password [email or web address]  Addendum Trop elevated, discussed with cards [phone]) who  reviewed EKGs and hx. Advised likely from heart disease, pna and hypoxia. No heparin for now. They advised since no afib on strips stop Cardizem and monitor. Re consult if needed. No ASA due to risk of anaphylaxis, discussed with pharmacy.  Haydee Salter MD

## 2016-06-11 NOTE — Progress Notes (Signed)
Troponin 0.24. MD notified. Bruneau, Mitzi Hansen

## 2016-06-11 NOTE — Plan of Care (Signed)
Problem: Activity: Goal: Capacity to carry out activities will improve Outcome: Progressing Pt states he is able to tolerate laying back more, does not get short of breath.  Problem: Cardiac: Goal: Ability to achieve and maintain adequate cardiopulmonary perfusion will improve Outcome: Progressing QD weight, strict I/Os  Problem: Physical Regulation: Goal: Will remain free from infection Outcome: Progressing IV abx given per orders  Problem: Tissue Perfusion: Goal: Risk factors for ineffective tissue perfusion will decrease Outcome: Progressing SQ heparin  Problem: Fluid Volume: Goal: Ability to maintain a balanced intake and output will improve Outcome: Progressing IV lasix per MD orders

## 2016-06-11 NOTE — ED Notes (Signed)
Back from CT

## 2016-06-12 ENCOUNTER — Inpatient Hospital Stay (HOSPITAL_COMMUNITY): Payer: No Typology Code available for payment source

## 2016-06-12 DIAGNOSIS — G4733 Obstructive sleep apnea (adult) (pediatric): Secondary | ICD-10-CM

## 2016-06-12 DIAGNOSIS — I5023 Acute on chronic systolic (congestive) heart failure: Secondary | ICD-10-CM

## 2016-06-12 DIAGNOSIS — E785 Hyperlipidemia, unspecified: Secondary | ICD-10-CM

## 2016-06-12 DIAGNOSIS — J9601 Acute respiratory failure with hypoxia: Secondary | ICD-10-CM

## 2016-06-12 DIAGNOSIS — I10 Essential (primary) hypertension: Secondary | ICD-10-CM

## 2016-06-12 DIAGNOSIS — J159 Unspecified bacterial pneumonia: Secondary | ICD-10-CM

## 2016-06-12 LAB — GLUCOSE, CAPILLARY
GLUCOSE-CAPILLARY: 263 mg/dL — AB (ref 65–99)
GLUCOSE-CAPILLARY: 228 mg/dL — AB (ref 65–99)
Glucose-Capillary: 217 mg/dL — ABNORMAL HIGH (ref 65–99)
Glucose-Capillary: 272 mg/dL — ABNORMAL HIGH (ref 65–99)

## 2016-06-12 LAB — CBC WITH DIFFERENTIAL/PLATELET
Basophils Absolute: 0 10*3/uL (ref 0.0–0.1)
Basophils Relative: 0 %
Eosinophils Absolute: 0.1 10*3/uL (ref 0.0–0.7)
Eosinophils Relative: 1 %
HEMATOCRIT: 35.8 % — AB (ref 39.0–52.0)
HEMOGLOBIN: 11.7 g/dL — AB (ref 13.0–17.0)
LYMPHS ABS: 2 10*3/uL (ref 0.7–4.0)
LYMPHS PCT: 22 %
MCH: 28.8 pg (ref 26.0–34.0)
MCHC: 32.7 g/dL (ref 30.0–36.0)
MCV: 88.2 fL (ref 78.0–100.0)
MONOS PCT: 6 %
Monocytes Absolute: 0.5 10*3/uL (ref 0.1–1.0)
NEUTROS PCT: 71 %
Neutro Abs: 6.5 10*3/uL (ref 1.7–7.7)
Platelets: 170 10*3/uL (ref 150–400)
RBC: 4.06 MIL/uL — AB (ref 4.22–5.81)
RDW: 12.8 % (ref 11.5–15.5)
WBC: 9.1 10*3/uL (ref 4.0–10.5)

## 2016-06-12 LAB — BASIC METABOLIC PANEL
Anion gap: 8 (ref 5–15)
BUN: 9 mg/dL (ref 6–20)
CHLORIDE: 102 mmol/L (ref 101–111)
CO2: 28 mmol/L (ref 22–32)
Calcium: 8.6 mg/dL — ABNORMAL LOW (ref 8.9–10.3)
Creatinine, Ser: 0.9 mg/dL (ref 0.61–1.24)
GFR calc non Af Amer: >60 mL/min (ref >60)
Glucose, Bld: 289 mg/dL — ABNORMAL HIGH (ref 65–99)
POTASSIUM: 3.5 mmol/L (ref 3.5–5.1)
SODIUM: 138 mmol/L (ref 135–145)

## 2016-06-12 MED ORDER — LOSARTAN POTASSIUM 25 MG PO TABS
12.5000 mg | ORAL_TABLET | Freq: Every day | ORAL | Status: DC
  Administered 2016-06-12: 12.5 mg via ORAL
  Filled 2016-06-12 (×2): qty 1

## 2016-06-12 MED ORDER — GUAIFENESIN ER 600 MG PO TB12
1200.0000 mg | ORAL_TABLET | Freq: Two times a day (BID) | ORAL | Status: DC
  Administered 2016-06-12 – 2016-06-13 (×3): 1200 mg via ORAL
  Filled 2016-06-12 (×3): qty 2

## 2016-06-12 MED ORDER — FUROSEMIDE 10 MG/ML IJ SOLN
80.0000 mg | Freq: Once | INTRAMUSCULAR | Status: AC
  Administered 2016-06-12: 80 mg via INTRAVENOUS
  Filled 2016-06-12: qty 8

## 2016-06-12 MED ORDER — POTASSIUM CHLORIDE CRYS ER 20 MEQ PO TBCR
40.0000 meq | EXTENDED_RELEASE_TABLET | Freq: Once | ORAL | Status: DC
  Filled 2016-06-12: qty 2

## 2016-06-12 MED ORDER — MAGNESIUM SULFATE 2 GM/50ML IV SOLN
2.0000 g | Freq: Once | INTRAVENOUS | Status: AC
  Administered 2016-06-12: 2 g via INTRAVENOUS
  Filled 2016-06-12: qty 50

## 2016-06-12 MED ORDER — GUAIFENESIN-DM 100-10 MG/5ML PO SYRP: 5.0000 mL | ORAL_SOLUTION | ORAL | Status: DC | PRN

## 2016-06-12 MED ORDER — ATORVASTATIN CALCIUM 40 MG PO TABS
40.0000 mg | ORAL_TABLET | Freq: Every day | ORAL | Status: DC
  Administered 2016-06-12: 40 mg via ORAL
  Filled 2016-06-12: qty 1

## 2016-06-12 MED ORDER — CARVEDILOL 3.125 MG PO TABS
3.1250 mg | ORAL_TABLET | Freq: Two times a day (BID) | ORAL | Status: DC
  Administered 2016-06-12 – 2016-06-13 (×3): 3.125 mg via ORAL
  Filled 2016-06-12 (×3): qty 1

## 2016-06-12 MED ORDER — DIGOXIN 125 MCG PO TABS
0.1250 mg | ORAL_TABLET | Freq: Every day | ORAL | Status: DC
  Administered 2016-06-12 – 2016-06-13 (×2): 0.125 mg via ORAL
  Filled 2016-06-12 (×2): qty 1

## 2016-06-12 MED ORDER — ZOLPIDEM TARTRATE 5 MG PO TABS
5.0000 mg | ORAL_TABLET | Freq: Every evening | ORAL | Status: DC | PRN
  Administered 2016-06-12: 5 mg via ORAL
  Filled 2016-06-12 (×2): qty 1

## 2016-06-12 MED ORDER — FUROSEMIDE 20 MG PO TABS
60.0000 mg | ORAL_TABLET | Freq: Every day | ORAL | Status: DC
  Administered 2016-06-13: 60 mg via ORAL
  Filled 2016-06-12 (×2): qty 1

## 2016-06-12 NOTE — Progress Notes (Signed)
PROGRESS NOTE  Mitchell [name]  ZOX:[id number] DOB: [birth year] DOA: 06/11/2016 PCP: Caffie Damme, MD Outpatient Specialists:  Subjective: Feels much better seen he was talking on the phone without the oxygen. I appreciated CHF team recommendations.  Brief Narrative:  Mitchell [name] is a 45 y.o. male with hx significant for CHF, depression, DM, HTN, MI & OSA who presented to [facility] with complaint of SOB. Patient had a disagreement with the staff at his cardiologist's office. He then fired his doctor. A few weeks later patient went to get refills of his heart medication and there were none available. Refills not requested and patient has been out of his medication for less than a week. Patient began to become symptomatic with shortness of breath 3 days ago. Also with a dry cough. Denies fevers, chills, nausea, vomiting. No sick contacts. Shortness of breath got to the point that patient couldn't lay flat when he tried to sleep so he had a friend bring him to the emergency room.  Assessment & Plan:   Principal Problem:   Acute respiratory failure with hypoxia (HCC) Active Problems:   HLD (hyperlipidemia)   Obstructive sleep apnea   Essential hypertension   Community acquired bacterial pneumonia   CHF (congestive heart failure) (HCC)   DKA, type 2, not at goal [facility])   Acute CHF exacerbation/Acute Resp Failure with hypoxia -Presented with acute SOB, orthopnea and lower extremity edema. -Elevated BNP at 266 and CXR showing pulmonary vascular congestion. -Noncompliance with heart failure medication for at least the past month. -CHF team consulted, patient started diuresis already feeling better. -Continued Coreg, digoxin and losartan, Aldactone discontinued because of history of noncompliance. -Daily weight, strict intake and/output and restrict fluid and salt intake.   Afib with RVR -Not seen on EKG but reported by EDP -Checking Mg & Phos -Unclear if he had documented atrial  fibrillation or it was just tachycardia.  CAP -Subjective fever only had low-grade fever 99.5 and WBC 11 in the hospital but CT scan suggest pneumonia. -Started on azithromycin and Rocephin. -Continue supportive management with bronchodilators, mucolytics, antitussives and oxygen as needed.  Hypertension -When necessary hydralazine 10 mg IV as needed for severe blood pressure  DM  -SSI, check hemoglobin A1c and carb modified diet  OSA -CPAP at night  Hyperlipidemia -Continue statin, check FLP.   DVT prophylaxis: SQ Heparin Code Status: Full Code Family Communication:  Disposition Plan: Transfer to telemetry. Diet: Diet heart healthy/carb modified Room service appropriate? Yes; Fluid consistency: Thin  Consultants:   CHF team  Procedures:   None  Antimicrobials:   Azith and Rocephin  Objective: Vitals:   06/12/16 0000 06/12/16 0400 06/12/16 0740 06/12/16 0800  BP: (!) 136/97 111/83 (!) 123/95 120/81  Pulse: 99 95 98 93  Resp: (!) 27 (!) 22 15 20   Temp: 98.8 F (37.1 C) 98.7 F (37.1 C) 98.5 F (36.9 C)   TempSrc: Oral Oral Oral   SpO2: 97% 91% (!) 84% 99%  Weight:      Height:        Intake/Output Summary (Last 24 hours) at 06/12/16 1024 Last data filed at 06/11/16 1412  Gross per 24 hour  Intake           492.33 ml  Output             1500 ml  Net         -1007.67 ml   Filed Weights   06/11/16 0351 06/11/16 0909  Weight: 127.9 kg (282  lb) 128.6 kg (283 lb 9.6 oz)    Examination: General exam: Appears calm and comfortable  Respiratory system: Clear to auscultation. Respiratory effort normal. Cardiovascular system: S1 & S2 heard, RRR. No JVD, murmurs, rubs, gallops or clicks. No pedal edema. Gastrointestinal system: Abdomen is nondistended, soft and nontender. No organomegaly or masses felt. Normal bowel sounds heard. Central nervous system: Alert and oriented. No focal neurological deficits. Extremities: Symmetric 5 x 5 power. Skin: No  rashes, lesions or ulcers Psychiatry: Judgement and insight appear normal. Mood & affect appropriate.   Data Reviewed: I have personally reviewed following labs and imaging studies  CBC:  Recent Labs Lab 06/11/16 0411 06/11/16 0946 06/12/16 0221  WBC 11.0* 12.0* 9.1  NEUTROABS 9.1*  --  6.5  HGB 12.4* 12.1* 11.7*  HCT 37.0* 36.2* 35.8*  MCV 89.2 88.3 88.2  PLT 160 173 170   Basic Metabolic Panel:  Recent Labs Lab 06/11/16 0411 06/11/16 0939 06/11/16 0946 06/12/16 0221  NA 138  --   --  138  K 3.6  --   --  3.5  CL 104  --   --  102  CO2 25  --   --  28  GLUCOSE 352*  --   --  289*  BUN 9  --   --  9  CREATININE 0.79  --  0.87 0.90  CALCIUM 8.9  --   --  8.6*  MG  --  1.4*  --   --   PHOS  --  3.4  --   --    GFR: Estimated Creatinine Clearance: 140.6 mL/min (by C-G formula based on SCr of 0.9 mg/dL). Liver Function Tests:  Recent Labs Lab 06/11/16 0411  AST 20  ALT 12*  ALKPHOS 93  BILITOT 0.5  PROT 7.3  ALBUMIN 3.6   No results for input(s): LIPASE, AMYLASE in the last 168 hours. No results for input(s): AMMONIA in the last 168 hours. Coagulation Profile: No results for input(s): INR, PROTIME in the last 168 hours. Cardiac Enzymes:  Recent Labs Lab 06/11/16 0411 06/11/16 0939 06/11/16 1542 06/11/16 2103  TROPONINI 0.04* 0.24* 0.36* 0.32*   BNP (last 3 results) No results for input(s): PROBNP in the last 8760 hours. HbA1C: No results for input(s): HGBA1C in the last 72 hours. CBG:  Recent Labs Lab 06/11/16 0940 06/11/16 1111 06/11/16 1633 06/11/16 2127 06/12/16 0742  GLUCAP 331* 296* 241* 287* 217*   Lipid Profile: No results for input(s): CHOL, HDL, LDLCALC, TRIG, CHOLHDL, LDLDIRECT in the last 72 hours. Thyroid Function Tests: No results for input(s): TSH, T4TOTAL, FREET4, T3FREE, THYROIDAB in the last 72 hours. Anemia Panel: No results for input(s): VITAMINB12, FOLATE, FERRITIN, TIBC, IRON, RETICCTPCT in the last 72  hours. Urine analysis:    Component Value Date/Time   COLORURINE YELLOW 12/08/2014 2355   APPEARANCEUR CLOUDY (A) 12/08/2014 2355   LABSPEC 1.040 (H) 12/08/2014 2355   PHURINE 5.0 12/08/2014 2355   GLUCOSEU >1000 (A) 12/08/2014 2355   HGBUR TRACE (A) 12/08/2014 2355   BILIRUBINUR NEGATIVE 12/08/2014 2355   KETONESUR NEGATIVE 12/08/2014 2355   PROTEINUR NEGATIVE 12/08/2014 2355   UROBILINOGEN 0.2 12/08/2014 2355   NITRITE POSITIVE (A) 12/08/2014 2355   LEUKOCYTESUR TRACE (A) 12/08/2014 2355   Sepsis Labs: @LABRCNTIP (procalcitonin:4,lacticidven:4)  ) Recent Results (from the past 240 hour(s))  MRSA PCR Screening     Status: None   Collection Time: 06/11/16  9:23 AM  Result Value Ref Range Status   MRSA by  PCR NEGATIVE NEGATIVE Final    Comment:        The GeneXpert MRSA Assay (FDA approved for NASAL specimens only), is one component of a comprehensive MRSA colonization surveillance program. It is not intended to diagnose MRSA infection nor to guide or monitor treatment for MRSA infections.      Invalid input(s): PROCALCITONIN, LACTICACIDVEN   Radiology Studies: Ct Angio Chest Pe W And/or Wo Contrast  Result Date: 06/11/2016 CLINICAL DATA:  Shortness of breath for 3 days but worse tonight. Elevated troponin. History of stroke, congestive heart failure, diabetes, hypertension, PVC, cardiac catheterization. EXAM: CT ANGIOGRAPHY CHEST WITH CONTRAST TECHNIQUE: Multidetector CT imaging of the chest was performed using the standard protocol during bolus administration of intravenous contrast. Multiplanar CT image reconstructions and MIPs were obtained to evaluate the vascular anatomy. CONTRAST:  100 mL Isovue 370 COMPARISON:  None. FINDINGS: Cardiovascular: Examination is somewhat limited by contrast bolus and motion artifact but no significant pulmonary emboli are demonstrated. Normal heart size. Small pericardial effusion. Normal caliber thoracic aorta. Mediastinum/Nodes:  Mediastinal lymph nodes are not pathologically enlarged. Esophagus is decompressed. Thyroid gland is unremarkable. Lungs/Pleura: Small bilateral pleural effusions with basilar atelectasis. Mosaic attenuation pattern to the lungs may indicate pulmonary edema. Vague nodular ground-glass infiltrative pattern to the right lung is probably inflammatory. Consider pneumonia or atypical infectious process. No pneumothorax. Airways are patent. Upper Abdomen: No acute abnormality. Musculoskeletal: No chest wall abnormality. No acute or significant osseous findings. Review of the MIP images confirms the above findings. IMPRESSION: No evidence of significant pulmonary embolus. Small pericardial effusion. Small bilateral pleural effusions with basilar atelectasis. Probable bilateral edema. Patchy nodular ground-glass infiltration in the right lung probably represents pneumonia or atypical infectious process. Electronically Signed   By: Burman NievesWilliam  Stevens M.D.   On: 06/11/2016 06:14   Dg Chest Port 1 View  Result Date: 06/12/2016 CLINICAL DATA:  Shortness of Breath EXAM: PORTABLE CHEST 1 VIEW COMPARISON:  June 11, 2016 FINDINGS: There is no edema or consolidation. There is cardiomegaly with mild pulmonary venous hypertension. No adenopathy. No bone lesions. IMPRESSION: Pulmonary vascular congestion without frank edema or consolidation. Electronically Signed   By: Bretta BangWilliam  Woodruff III M.D.   On: 06/12/2016 07:05   Dg Chest Port 1 View  Result Date: 06/11/2016 CLINICAL DATA:  Shortness of breath for 3 days, worse tonight. EXAM: PORTABLE CHEST 1 VIEW COMPARISON:  12/06/2015 FINDINGS: Cardiac enlargement with mild pulmonary vascular congestion. Slight interstitial changes in the bases suggesting early edema. No focal consolidation. No pleural effusions. No pneumothorax. Mediastinal contours appear intact. IMPRESSION: Cardiac enlargement with mild pulmonary vascular congestion and mild interstitial edema. Electronically  Signed   By: Burman NievesWilliam  Stevens M.D.   On: 06/11/2016 04:44        Scheduled Meds: . atorvastatin  40 mg Oral q1800  . azithromycin  500 mg Oral Daily  . carvedilol  3.125 mg Oral BID WC  . cefTRIAXone (ROCEPHIN)  IV  1 g Intravenous Q24H  . digoxin  0.125 mg Oral Daily  . furosemide  80 mg Intravenous Once  . [START ON 06/13/2016] furosemide  60 mg Oral Daily  . heparin  5,000 Units Subcutaneous Q8H  . insulin aspart  0-20 Units Subcutaneous TID WC  . losartan  12.5 mg Oral Daily  . magnesium sulfate 1 - 4 g bolus IVPB  2 g Intravenous Once  . mouth rinse  15 mL Mouth Rinse BID  . sodium chloride flush  3 mL Intravenous Q12H  Continuous Infusions:   LOS: 1 day    Time spent: 35 minutes    Nicolette Gieske A, MD [facility] Pager [phone]  If 7PM-7AM, please contact night-coverage [email or web address] Password [email or web address] 06/12/2016, 10:24 AM

## 2016-06-12 NOTE — Progress Notes (Signed)
Received [name] November to room 3E09.  Magnesium bolus infusing.  Denies pain  Placed on tele box # 01 and CCMD called to verify.

## 2016-06-12 NOTE — Progress Notes (Signed)
Inpatient Diabetes Program Recommendations  AACE/ADA: New Consensus Statement on Inpatient Glycemic Control (2015)  Target Ranges:  Prepandial:   less than 140 mg/dL      Peak postprandial:   less than 180 mg/dL (1-2 hours)      Critically ill patients:  140 - 180 mg/dL   Lab Results  Component Value Date   GLUCAP 217 (H) 06/12/2016   HGBA1C 14.1 (H) 11/22/2013    Review of Glycemic Control Results for Mitchell [name], Mitchell [name] (MRN [id number]) as of 06/12/2016 09:47  Ref. Range 06/11/2016 09:40 06/11/2016 11:11 06/11/2016 16:33 06/11/2016 21:27 06/12/2016 07:42  Glucose-Capillary Latest Ref Range: 65 - 99 mg/dL 528 (H) 413 (H) 244 (H) 287 (H) 217 (H)   Diabetes history: DM2 Outpatient Diabetes medications: 70/30 insulin 45 units bid Current orders for Inpatient glycemic control: Novolog correction 0-20 units tid with meals  Inpatient Diabetes Program Recommendations:    Please consider while in the hospital, -Lantus 30 units -Novolog 4-5 units meal coverage tid if eats 50% -A1c to determine prehospital glycemic control  Thank you, Darel Hong E. Charnay Nazario, RN, MSN, CDE Inpatient Glycemic Control Team Team Pager [phone] (8am-5pm) 06/12/2016 9:49 AM

## 2016-06-12 NOTE — Consult Note (Signed)
WOC consulted for RLE. Patient is follow by the wound care center at [facility].  Patient has reported neuropathic foot ulcer on the plantar surface of the RLE.  He has a total contact cast in place that forces his non weightbearing status.  Will not be changed while inpatient, will be changed at next [facility] appointment this Friday.   Discussed POC with patient and bedside nurse.  Re consult if needed, will not follow at this time. Thanks  Donna Snooks [employer], RN,CWOCN, CNS [phone])

## 2016-06-12 NOTE — Plan of Care (Signed)
Problem: Cardiac: Goal: Ability to achieve and maintain adequate cardiopulmonary perfusion will improve Outcome: Completed/Met Date Met: 06/12/16 Reviewed information in "Living Better with Heart Failure" booklet

## 2016-06-12 NOTE — Consult Note (Signed)
Advanced Heart Failure Team Consult Note  Referring Physician: Dr Arthor Captain Primary Physician: None Primary HF Cardiologist:  Dr Gala Romney  Reason for Consultation: Heart Failure   HPI:    Mitchell [name] is a 45 year old male with multiple medical problems including obesity, uncontrolled hypertension, uncontrolled diabetes, obstructive sleep apnea, bipolar disease/schizophrenia, CVA, nonobstructive coronary disease and systolic congestive heart failure secondary to nonischemic cardiomyopathy. Last echo 2015 EF 20-25%  He was last seen in the HF clinic 2 years ago. [facility] called and he has not picked up HF meds in at least 1 month.   Admitted with increased dyspnea. Prior to admit he had been out of his medications for a few weeks.  A week prior to admit he was complaining of cough , orthopnea, and PND.  In the ED he was hypertensive so nitro paste was applied. CXR with vascular congestion.   CT of chest- No PE. + pneumonia/pulmonary edema.Nodular ground-glass infiltration in the right lung probably represents pneumonia or atypical infectious process. Placed on azithromycin and rocephin. Yesterday he was diuresed with 120 mg IV lasix.  Pertinent admission labs include: K 3.6, Creatinine 0.79, Hgb 12.4, WBC 11, Mag 1.4, BNP 266, Troponin 0.04>0.24>0.36.   Wants to go home. Denies SOB   Review of Systems: [y] = yes, [ ]  = no   General: Weight gain [ ] ; Weight loss [ ] ; Anorexia [ ] ; Fatigue [ Y]; Fever [ ] ; Chills [ ] ; Weakness [ ]   Cardiac: Chest pain/pressure [ ] ; Resting SOB [ ] ; Exertional SOB [Y ]; Orthopnea [Y ]; Pedal Edema [ ] ; Palpitations [ ] ; Syncope [ ] ; Presyncope [ ] ; Paroxysmal nocturnal dyspneaY ]  Pulmonary: Cough [Y ]; Wheezing[ ] ; Hemoptysis[ ] ; Sputum [ ] ; Snoring [ ]   GI: Vomiting[ ] ; Dysphagia[ ] ; Melena[ ] ; Hematochezia [ ] ; Heartburn[ ] ; Abdominal pain [ ] ; Constipation [ ] ; Diarrhea [ ] ; BRBPR [ ]   GU: Hematuria[ ] ; Dysuria [ ] ; Nocturia[ ]   Vascular: Pain in legs with  walking [ ] ; Pain in feet with lying flat [ ] ; Non-healing sores [ ] ; Stroke [ ] ; TIA [ ] ; Slurred speech [ ] ;  Neuro: Headaches[ ] ; Vertigo[ ] ; Seizures[ ] ; Paresthesias[ ] ;Blurred vision [ ] ; Diplopia [ ] ; Vision changes [ ]   Ortho/Skin: Arthritis [Y ]; Joint pain [ Y]; Muscle pain [ ] ; Joint swelling [ ] ; Back Pain [ ] ; Rash [ ]   Psych: Depression[ ] ; Anxiety[ ]   Heme: Bleeding problems [ ] ; Clotting disorders [ ] ; Anemia [ ]   Endocrine: Diabetes [Y ]; Thyroid dysfunction[ ]   Home Medications Prior to Admission medications   Medication Sig Start Date End Date Taking? Authorizing Provider  atorvastatin (LIPITOR) 40 MG tablet Take 40 mg by mouth every evening. 09/24/15  Yes Historical Provider, MD  carvedilol (COREG) 25 MG tablet Take 1 tablet (25 mg total) by mouth 2 (two) times daily with a meal. 12/09/14  Yes John Molpus, MD  digoxin (LANOXIN) 0.25 MG tablet Take 1 tablet (0.25 mg total) by mouth daily. 12/09/14  Yes John Molpus, MD  furosemide (LASIX) 40 MG tablet Take 1 tablet (40 mg total) by mouth 2 (two) times daily. 12/09/14  Yes John Molpus, MD  insulin NPH-regular Human (NOVOLIN 70/30) (70-30) 100 UNIT/ML injection Inject 40 Units into the skin 2 (two) times daily with a meal. Patient taking differently: Inject 45 Units into the skin 2 (two) times daily with a meal.  04/06/16  Yes Fayrene Helper, PA-C  lisinopril (ZESTRIL) 2.5 MG tablet  Take 1 tablet (2.5 mg total) by mouth daily. 12/08/15  Yes Mauricio Annett Gula, MD  losartan (COZAAR) 100 MG tablet Take 100 mg by mouth daily.   Yes Historical Provider, MD  QUEtiapine (SEROQUEL) 100 MG tablet Take 1.5 tablets (150 mg total) by mouth at bedtime. 12/08/15  Yes Mauricio Annett Gula, MD  spironolactone (ALDACTONE) 50 MG tablet Take 1 tablet (50 mg total) by mouth daily. 12/08/15  Yes Mauricio Annett Gula, MD  acetaminophen (TYLENOL) 500 MG tablet Take 1 tablet (500 mg total) by mouth every 6 (six) hours as needed. Patient not taking: Reported  on 06/11/2016 06/21/15   Cheri Fowler, PA-C  cephALEXin (KEFLEX) 500 MG capsule Take 1 capsule (500 mg total) by mouth 3 (three) times daily. 04/06/16   Fayrene Helper, PA-C  cyclobenzaprine (FLEXERIL) 5 MG tablet Take 1 tablet (5 mg total) by mouth 3 (three) times daily as needed for muscle spasms. Patient not taking: Reported on 06/11/2016 06/21/15   Cheri Fowler, PA-C  HYDROcodone-acetaminophen (NORCO/VICODIN) 5-325 MG per tablet Take 1-2 tablets by mouth every 6 (six) hours as needed. Patient not taking: Reported on 06/11/2016 01/16/15   Ladona Mow, PA-C  methocarbamol (ROBAXIN) 500 MG tablet Take 1 tablet (500 mg total) by mouth 2 (two) times daily. Patient not taking: Reported on 06/11/2016 07/06/15   Dub Mikes, PA-C    Past Medical History: Past Medical History:  Diagnosis Date  . Bipolar disorder (HCC)   . CHF (congestive heart failure) (HCC)   . Chronic systolic heart failure (HCC) 11/2010   a. NICM 4/14 EF 20-25%, TR mild  . CVA (cerebral infarction)    No residual deficits  . Depression    PTSD,   . Diabetes mellitus    TYPE II; UNCONTROLLED BY HEMOGLOBIN A1c; STABLE AS  PER DISCHARGE  . Headache(784.0)   . 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 History of colonic polyps   . Hyperlipidemia   . Hypertension   . Myocardial infarction    at age 5 years old (while playing football)  . Non-ischemic cardiomyopathy (HCC)    No ischemia on myoview, showed EF of 42%. 2D echo showed EF 50-55% with diatolic dysfunction in 2009  . Obesity   . OSA (obstructive sleep apnea)    Being set up again for c-pap  . Pneumonia   . Post-cardiac injury syndrome (HCC)    History of cardiac injury from blunt trauma  . PVCs (premature ventricular contractions)   . Schizophrenia (HCC)    [facility]  . SOB (shortness of breath)   . Stroke [facility]) 2005   some left side weakness  . Syncope    Recurrent, thought to be vasovagal. Also has h/o frequent PVCs.     Past  Surgical History: Past Surgical History:  Procedure Laterality Date  . CARDIAC CATHETERIZATION  12/19/10   DIFFUSE NONOBSTRUCTIVE CAD; NONISCHEMIC CARDIOMYOPATHY; LEFT VENTRICULAR ANGIOGRAM WAS PERFORMED SECONDARY TO  ELEVATED LEFT VENTRICULAR FILLING PRESSURES  . COLONOSCOPY W/ POLYPECTOMY    . MULTIPLE EXTRACTIONS WITH ALVEOLOPLASTY  01/27/2014   "all my teeth; 4 Quadrants of alveoloplasty  . MULTIPLE EXTRACTIONS WITH ALVEOLOPLASTY N/A 01/27/2014   Procedure: EXTRACTION OF TEETH #'1, 2, 3, 4, 5, 6, 7, 8, 9, 10, 11, 12, 13, 14, 15, 16, 17, 20, 21, 22, 23, 24, 25, 26, 27, 28, 29, 31 and 32 WITH ALVEOLOPLASTY;  Surgeon: Charlynne Pander, DDS;  Location: [facility] OR;  Service: Oral Surgery;  Laterality:  N/A;  . ORIF FINGER / THUMB FRACTURE Right     Family History: Family History  Problem Relation Age of Onset  . Heart disease Mother     MI  . Heart failure Mother   . Diabetes Mother     ALSO IN MOST OF HIS SIBLINGS; 2 UNLCES HAVE ALSO PASSED AWAY FROM DM  . Cardiomyopathy Mother   . Cancer - Ovarian Mother   . Heart disease Father   . Hypertension Father   . Diabetes Father     Social History: Social History   Social History  . Marital status: Married    Spouse name: N/A  . Number of children: 5  . Years of education: N/A   Occupational History  . Mortician    Social History Main Topics  . Smoking status: Never Smoker  . Smokeless tobacco: Never Used  . Alcohol use No  . Drug use: No  . Sexual activity: Yes   Other Topics Concern  . None   Social History Narrative   MARRIED, LIVES IN [place] WITH WIFE; GREW UP IN [place] AND USED TO BE A COOK; HE ENJOYS COOKING AND ENJOYS EATING A LOT OF PORK AND SALT.    Allergies:  Allergies  Allergen Reactions  . Nsaids Anaphylaxis    Objective:    Vital Signs:   Temp:  [98.4 F (36.9 C)-98.9 F (37.2 C)] 98.5 F (36.9 C) (11/13 0740) Pulse Rate:  [92-100] 93 (11/13 0800) Resp:  [5-28] 20 (11/13 0800) BP:  (111-145)/(58-113) 120/81 (11/13 0800) SpO2:  [84 %-100 %] 99 % (11/13 0800) FiO2 (%):  [28 %] 28 % (11/12 0939) Last BM Date: 06/10/16  Weight change: Filed Weights   06/11/16 0351 06/11/16 0909  Weight: 127.9 kg (282 lb) 128.6 kg (283 lb 9.6 oz)    Intake/Output:   Intake/Output Summary (Last 24 hours) at 06/12/16 0923 Last data filed at 06/11/16 1412  Gross per 24 hour  Intake           540.67 ml  Output             1500 ml  Net          -959.33 ml     Physical Exam: General:  Well appearing. No resp difficulty. In bed.  HEENT: normal Neck: supple. JVP 9-10. Carotids 2+ bilat; no bruits. No lymphadenopathy or thryomegaly appreciated. Cor: PMI nondisplaced. Regular rate & rhythm. No rubs, or murmurs. + S3  Lungs: Decreased in bases.  Abdomen: round, soft, nontender, nondistended. No hepatosplenomegaly. No bruits or masses. Good bowel sounds. Extremities: no cyanosis, clubbing, rash, RLE  Total cast. LLE trace edema Neuro: alert & orientedx3, cranial nerves grossly intact. moves all 4 extremities w/o difficulty. Affect pleasant  Telemetry: Sinus Tach 100s  Labs: Basic Metabolic Panel:  Recent Labs Lab 06/11/16 0411 06/11/16 0939 06/11/16 0946 06/12/16 0221  NA 138  --   --  138  K 3.6  --   --  3.5  CL 104  --   --  102  CO2 25  --   --  28  GLUCOSE 352*  --   --  289*  BUN 9  --   --  9  CREATININE 0.79  --  0.87 0.90  CALCIUM 8.9  --   --  8.6*  MG  --  1.4*  --   --   PHOS  --  3.4  --   --     Liver  Function Tests:  Recent Labs Lab 06/11/16 0411  AST 20  ALT 12*  ALKPHOS 93  BILITOT 0.5  PROT 7.3  ALBUMIN 3.6   No results for input(s): LIPASE, AMYLASE in the last 168 hours. No results for input(s): AMMONIA in the last 168 hours.  CBC:  Recent Labs Lab 06/11/16 0411 06/11/16 0946 06/12/16 0221  WBC 11.0* 12.0* 9.1  NEUTROABS 9.1*  --  6.5  HGB 12.4* 12.1* 11.7*  HCT 37.0* 36.2* 35.8*  MCV 89.2 88.3 88.2  PLT 160 173 170    Cardiac  Enzymes:  Recent Labs Lab 06/11/16 0411 06/11/16 0939 06/11/16 1542 06/11/16 2103  TROPONINI 0.04* 0.24* 0.36* 0.32*    BNP: BNP (last 3 results)  Recent Labs  10/30/15 0135 12/06/15 2033 06/11/16 0411  BNP 153.6* 368.4* 266.9*    ProBNP (last 3 results) No results for input(s): PROBNP in the last 8760 hours.   CBG:  Recent Labs Lab 06/11/16 0940 06/11/16 1111 06/11/16 1633 06/11/16 2127 06/12/16 0742  GLUCAP 331* 296* 241* 287* 217*    Coagulation Studies: No results for input(s): LABPROT, INR in the last 72 hours.  Other results: EKG: Sinus Rhythm 95 bpm   Imaging: Ct Angio Chest Pe W And/or Wo Contrast  Result Date: 06/11/2016 CLINICAL DATA:  Shortness of breath for 3 days but worse tonight. Elevated troponin. History of stroke, congestive heart failure, diabetes, hypertension, PVC, cardiac catheterization. EXAM: CT ANGIOGRAPHY CHEST WITH CONTRAST TECHNIQUE: Multidetector CT imaging of the chest was performed using the standard protocol during bolus administration of intravenous contrast. Multiplanar CT image reconstructions and MIPs were obtained to evaluate the vascular anatomy. CONTRAST:  100 mL Isovue 370 COMPARISON:  None. FINDINGS: Cardiovascular: Examination is somewhat limited by contrast bolus and motion artifact but no significant pulmonary emboli are demonstrated. Normal heart size. Small pericardial effusion. Normal caliber thoracic aorta. Mediastinum/Nodes: Mediastinal lymph nodes are not pathologically enlarged. Esophagus is decompressed. Thyroid gland is unremarkable. Lungs/Pleura: Small bilateral pleural effusions with basilar atelectasis. Mosaic attenuation pattern to the lungs may indicate pulmonary edema. Vague nodular ground-glass infiltrative pattern to the right lung is probably inflammatory. Consider pneumonia or atypical infectious process. No pneumothorax. Airways are patent. Upper Abdomen: No acute abnormality. Musculoskeletal: No chest  wall abnormality. No acute or significant osseous findings. Review of the MIP images confirms the above findings. IMPRESSION: No evidence of significant pulmonary embolus. Small pericardial effusion. Small bilateral pleural effusions with basilar atelectasis. Probable bilateral edema. Patchy nodular ground-glass infiltration in the right lung probably represents pneumonia or atypical infectious process. Electronically Signed   By: Burman NievesWilliam  Stevens M.D.   On: 06/11/2016 06:14   Dg Chest Port 1 View  Result Date: 06/12/2016 CLINICAL DATA:  Shortness of Breath EXAM: PORTABLE CHEST 1 VIEW COMPARISON:  June 11, 2016 FINDINGS: There is no edema or consolidation. There is cardiomegaly with mild pulmonary venous hypertension. No adenopathy. No bone lesions. IMPRESSION: Pulmonary vascular congestion without frank edema or consolidation. Electronically Signed   By: Bretta BangWilliam  Woodruff III M.D.   On: 06/12/2016 07:05   Dg Chest Port 1 View  Result Date: 06/11/2016 CLINICAL DATA:  Shortness of breath for 3 days, worse tonight. EXAM: PORTABLE CHEST 1 VIEW COMPARISON:  12/06/2015 FINDINGS: Cardiac enlargement with mild pulmonary vascular congestion. Slight interstitial changes in the bases suggesting early edema. No focal consolidation. No pleural effusions. No pneumothorax. Mediastinal contours appear intact. IMPRESSION: Cardiac enlargement with mild pulmonary vascular congestion and mild interstitial edema. Electronically Signed   By: Chrissie NoaWilliam  Andria Meuse M.D.   On: 06/11/2016 04:44      Medications:     Current Medications: . azithromycin  500 mg Oral Daily  . cefTRIAXone (ROCEPHIN)  IV  1 g Intravenous Q24H  . heparin  5,000 Units Subcutaneous Q8H  . insulin aspart  0-20 Units Subcutaneous TID WC  . mouth rinse  15 mL Mouth Rinse BID  . nitroGLYCERIN  0.5 inch Topical Q8H  . sodium chloride flush  3 mL Intravenous Q12H     Infusions:    Assessment/Plan/Discussion    Mr Vespa is a 45 year old  known NICM. At one point he was followed in the HF clinic but has not been seen since 2015. Admitted with increased dyspnea from possible pneumonia and volume overload.   1. Pneumonia- On azithromycin and ceftriaxone. WBC peaked 12. Today down to 9.1  2. A/C Systolic Heart Failure - NICM LHC 2012 Non Obstructive CAD. In the past he refused ICD. Most recent ECHO 2015 EF 20-25%. Repeat ECHO Still with mild volume overload. Give one more dose of IV lasix then transition to lasix 60 mg daily.  Start carvedilol 3.125 mg twice a day + digoxin 0.125 mg daily.  Add 12.5 mg losartan at bed time. Anticipate starting entresto in the outpatient setting EF remains low.  Hold off on bidil because I am doubtful he will comply with three times a day dosing.Hold off on spiro with ongoing noncompliance 3. DMII- glucose remains elevated. On insulin.  4. RLE Diabetic Foot Ulcer- Total Cast in place. Followed [facility] 5. Noncompliance- medications and follow up.   We will try to re-establish in the outpatient HF clinic. He has been difficult to manage to due to ongoing noncompliance.   Length of Stay: 1  Amy Clegg NP-C  06/12/2016, 9:23 AM  Advanced Heart Failure Team Pager [phone] (M-F; 7a - 4p)  Please contact  Cardiology for night-coverage after hours (4p -7a ) and weekends on [email or web address]  Patient seen and examined with Tonye Becket, NP. We discussed all aspects of the encounter. I agree with the assessment and plan as stated above.   Mr. Niblack has been noncompliant and not followed up in HF Clinic in over 2 years. Last EF 20-25%. Now admitted with recurrent HF. Volume status now improved but still mildly volume overloaded. Continue IV lasix. He is tachycardic and has s3 on exam. Will begin to titrate HF meds. He is adamant he wants to go home tomorrow. He should be ready for this but will need close f/u in the HF Clinic.   Nadene Witherspoon,MD 4:12 PM

## 2016-06-13 ENCOUNTER — Inpatient Hospital Stay (HOSPITAL_COMMUNITY): Payer: No Typology Code available for payment source

## 2016-06-13 DIAGNOSIS — R509 Fever, unspecified: Secondary | ICD-10-CM

## 2016-06-13 DIAGNOSIS — E1310 Other specified diabetes mellitus with ketoacidosis without coma: Secondary | ICD-10-CM

## 2016-06-13 DIAGNOSIS — I5041 Acute combined systolic (congestive) and diastolic (congestive) heart failure: Secondary | ICD-10-CM

## 2016-06-13 LAB — GLUCOSE, CAPILLARY
Glucose-Capillary: 271 mg/dL — ABNORMAL HIGH (ref 65–99)
Glucose-Capillary: 256 mg/dL — ABNORMAL HIGH (ref 65–99)

## 2016-06-13 LAB — ECHOCARDIOGRAM COMPLETE
HEIGHTINCHES: 72 in
Weight: 4417.6 oz

## 2016-06-13 LAB — BASIC METABOLIC PANEL
ANION GAP: 9 (ref 5–15)
BUN: 12 mg/dL (ref 6–20)
CO2: 28 mmol/L (ref 22–32)
Calcium: 8.5 mg/dL — ABNORMAL LOW (ref 8.9–10.3)
Chloride: 101 mmol/L (ref 101–111)
Creatinine, Ser: 0.95 mg/dL (ref 0.61–1.24)
GFR calc Af Amer: >60 mL/min (ref >60)
Glucose, Bld: 297 mg/dL — ABNORMAL HIGH (ref 65–99)
POTASSIUM: 3.5 mmol/L (ref 3.5–5.1)
SODIUM: 138 mmol/L (ref 135–145)

## 2016-06-13 LAB — HEMOGLOBIN A1C
Hgb A1c MFr Bld: 12.1 % — ABNORMAL HIGH (ref 4.8–5.6)
MEAN PLASMA GLUCOSE: 301 mg/dL

## 2016-06-13 LAB — LIPID PANEL
CHOL/HDL RATIO: 5.4 ratio
CHOLESTEROL: 178 mg/dL (ref 0–200)
HDL: 33 mg/dL — AB (ref >40)
LDL Cholesterol: 111 mg/dL — ABNORMAL HIGH (ref 0–99)
Triglycerides: 169 mg/dL — ABNORMAL HIGH (ref <150)
VLDL: 34 mg/dL (ref 0–40)

## 2016-06-13 LAB — MAGNESIUM: MAGNESIUM: 1.8 mg/dL (ref 1.7–2.4)

## 2016-06-13 MED ORDER — CARVEDILOL 3.125 MG PO TABS: 3.1250 mg | ORAL_TABLET | Freq: Two times a day (BID) | ORAL | 0 refills | Status: DC

## 2016-06-13 MED ORDER — POTASSIUM CHLORIDE CRYS ER 20 MEQ PO TBCR
40.0000 meq | EXTENDED_RELEASE_TABLET | Freq: Once | ORAL | Status: AC
  Administered 2016-06-13: 40 meq via ORAL
  Filled 2016-06-13: qty 2

## 2016-06-13 MED ORDER — ATORVASTATIN CALCIUM 40 MG PO TABS: 40.0000 mg | ORAL_TABLET | Freq: Every day | ORAL | 0 refills | Status: DC

## 2016-06-13 MED ORDER — DIGOXIN 125 MCG PO TABS: 0.1250 mg | ORAL_TABLET | Freq: Every day | ORAL | 0 refills | Status: DC

## 2016-06-13 MED ORDER — LOSARTAN POTASSIUM 25 MG PO TABS: 25.0000 mg | ORAL_TABLET | Freq: Every day | ORAL | 0 refills | Status: DC

## 2016-06-13 MED ORDER — LOSARTAN POTASSIUM 25 MG PO TABS
25.0000 mg | ORAL_TABLET | Freq: Every day | ORAL | Status: DC
  Administered 2016-06-13: 25 mg via ORAL
  Filled 2016-06-13: qty 1

## 2016-06-13 MED ORDER — AMOXICILLIN-POT CLAVULANATE 875-125 MG PO TABS: 1.0000 | ORAL_TABLET | Freq: Two times a day (BID) | ORAL | 0 refills | Status: DC

## 2016-06-13 MED ORDER — FUROSEMIDE 40 MG PO TABS: 60.0000 mg | ORAL_TABLET | Freq: Every day | ORAL | 0 refills | Status: DC

## 2016-06-13 MED ORDER — PERFLUTREN LIPID MICROSPHERE
1.0000 mL | INTRAVENOUS | Status: DC | PRN
  Administered 2016-06-13: 4 mL via INTRAVENOUS
  Filled 2016-06-13: qty 10

## 2016-06-13 NOTE — Plan of Care (Signed)
Problem: Education: Goal: Ability to identify and alter actions that are detrimental to health will improve Outcome: Progressing Able to identify several ways that he will take care of himself post d/c, including f/u appts and meds  Problem: Respiratory: Goal: Ability to maintain a clear airway will improve Outcome: Completed/Met Date Met: 06/13/16 No further need for O2 at this time. Pt respiratory status ok w/ no distress assessed or reported.

## 2016-06-13 NOTE — Progress Notes (Signed)
Advanced Heart Failure Rounding Note   Subjective:    Diuresed with IV lasix and started HF meds.   Denies SOB. Wants to go  Home.     Objective:   Weight Range:  Vital Signs:   Temp:  [98.1 F (36.7 C)-99 F (37.2 C)] 99 F (37.2 C) (11/14 0605) Pulse Rate:  [94-99] 98 (11/14 0605) Resp:  [18-28] 18 (11/13 2024) BP: (133-136)/(84-98) 134/98 (11/14 0605) SpO2:  [89 %-100 %] 96 % (11/14 0605) Weight:  [268 lb 1.3 oz (121.6 kg)-276 lb 1.6 oz (125.2 kg)] 276 lb 1.6 oz (125.2 kg) (11/14 0605) Last BM Date: 06/12/16  Weight change: Filed Weights   06/11/16 0909 06/12/16 1440 06/13/16 0605  Weight: 283 lb 9.6 oz (128.6 kg) 268 lb 1.3 oz (121.6 kg) 276 lb 1.6 oz (125.2 kg)    Intake/Output:   Intake/Output Summary (Last 24 hours) at 06/13/16 0954 Last data filed at 06/13/16 0908  Gross per 24 hour  Intake              820 ml  Output             1801 ml  Net             -981 ml     Physical Exam: General:  Well appearing. No resp difficulty. In bed  HEENT: normal Neck: supple. JVP 6-7. Carotids 2+ bilat; no bruits. No lymphadenopathy or thryomegaly appreciated. Cor: PMI nondisplaced. Regular rate & rhythm. No rubs, or murmurs. + S3  Lungs: clear Abdomen: obese, soft, nontender, nondistended. No hepatosplenomegaly. No bruits or masses. Good bowel sounds. Extremities: no cyanosis, clubbing, rash, edema Neuro: alert & orientedx3, cranial nerves grossly intact. moves all 4 extremities w/o difficulty. Affect pleasant  Telemetry: NSR 80-90s  Labs: Basic Metabolic Panel:  Recent Labs Lab 06/11/16 0411 06/11/16 0939 06/11/16 0946 06/12/16 0221 06/13/16 0433  NA 138  --   --  138 138  K 3.6  --   --  3.5 3.5  CL 104  --   --  102 101  CO2 25  --   --  28 28  GLUCOSE 352*  --   --  289* 297*  BUN 9  --   --  9 12  CREATININE 0.79  --  0.87 0.90 0.95  CALCIUM 8.9  --   --  8.6* 8.5*  MG  --  1.4*  --   --  1.8  PHOS  --  3.4  --   --   --     Liver  Function Tests:  Recent Labs Lab 06/11/16 0411  AST 20  ALT 12*  ALKPHOS 93  BILITOT 0.5  PROT 7.3  ALBUMIN 3.6   No results for input(s): LIPASE, AMYLASE in the last 168 hours. No results for input(s): AMMONIA in the last 168 hours.  CBC:  Recent Labs Lab 06/11/16 0411 06/11/16 0946 06/12/16 0221  WBC 11.0* 12.0* 9.1  NEUTROABS 9.1*  --  6.5  HGB 12.4* 12.1* 11.7*  HCT 37.0* 36.2* 35.8*  MCV 89.2 88.3 88.2  PLT 160 173 170    Cardiac Enzymes:  Recent Labs Lab 06/11/16 0411 06/11/16 0939 06/11/16 1542 06/11/16 2103  TROPONINI 0.04* 0.24* 0.36* 0.32*    BNP: BNP (last 3 results)  Recent Labs  10/30/15 0135 12/06/15 2033 06/11/16 0411  BNP 153.6* 368.4* 266.9*    ProBNP (last 3 results) No results for input(s): PROBNP in the last 8760 hours.  Other results:  Imaging: Dg Chest Port 1 View  Result Date: 06/12/2016 CLINICAL DATA:  Shortness of Breath EXAM: PORTABLE CHEST 1 VIEW COMPARISON:  June 11, 2016 FINDINGS: There is no edema or consolidation. There is cardiomegaly with mild pulmonary venous hypertension. No adenopathy. No bone lesions. IMPRESSION: Pulmonary vascular congestion without frank edema or consolidation. Electronically Signed   By: Bretta Bang III M.D.   On: 06/12/2016 07:05      Medications:     Scheduled Medications: . atorvastatin  40 mg Oral q1800  . azithromycin  500 mg Oral Daily  . carvedilol  3.125 mg Oral BID WC  . cefTRIAXone (ROCEPHIN)  IV  1 g Intravenous Q24H  . digoxin  0.125 mg Oral Daily  . furosemide  60 mg Oral Daily  . guaiFENesin  1,200 mg Oral BID  . heparin  5,000 Units Subcutaneous Q8H  . insulin aspart  0-20 Units Subcutaneous TID WC  . losartan  25 mg Oral Daily  . mouth rinse  15 mL Mouth Rinse BID  . potassium chloride  40 mEq Oral Once     Infusions:   PRN Medications:  acetaminophen, albuterol, guaiFENesin-dextromethorphan, hydrALAZINE, ondansetron (ZOFRAN) IV,  zolpidem   Assessment/Plan/Discussion:   Mr Damboise is a 45 year old known NICM. At one point he was followed in the HF clinic but has not been seen since 2015. Admitted with increased dyspnea from possible pneumonia and volume overload.   1. Pneumonia- On azithromycin and ceftriaxone. WBC peaked 12.  2. A/C Systolic Heart Failure - NICM LHC 2012 Non Obstructive CAD. In the past he refused ICD. Most recent ECHO 2015 EF 20-25%. Repeat ECHO prior to discharge Volume status improved.  Stop IV lasix. Start lasix 60 mg daily.  Continue  carvedilol 3.125 mg twice a day + digoxin 0.125 mg daily.  Increase losartan to 25 mg daily.  Anticipate starting entresto in the outpatient setting EF remains low.  Hold off on bidil because I am doubtful he will comply with three times a day dosing.Hold off on spiro with ongoing noncompliance 3. DMII- glucose remains elevated. On insulin.  4. RLE Diabetic Foot Ulcer- Total Cast in place. [facility] 5. Noncompliance- medications and follow up.   HF follow up set up.   HF meds for d/c Lasix 60 mg daily  Losartan 25 mg daily at bed time Carvedilol 3.125 mg twice a day  Dig 0.125 mg daily    Ok to discharge once ECHO completed.   Length of Stay: 2   Amy Clegg NP-C  06/13/2016, 9:54 AM  Advanced Heart Failure Team Pager [phone] (M-F; 7a - 4p)  Please contact [facility] for night-coverage after hours (4p -7a ) and weekends on [email or web address]  Patient seen and examined with Tonye Becket, NP. We discussed all aspects of the encounter. I agree with the assessment and plan as stated above.   Volume status much improved. He is adamant he wants to go home today. Ok for d/c on above meds. Will follow closely in HF Clinic. Can consider switch to [facility] in near future as BO tolerates. Can also consider spiro if he is compliant with f/u. Repeat echo 3 months. If EF not improved he is now willing to consider ICD.   Jaydrien Wassenaar,  Holbert Caples,MD 11:08 AM

## 2016-06-13 NOTE — Progress Notes (Signed)
Patient discharged for home via car transport.  Reviewed all discharge instructions, including new, changed, and stopped medications and next dose due for scheduled medications.  Reviewed follow-up appointments and heart failure instructions.  Patient confirmed he is taking cell phone, necklace, ring, dentures, glasses, and clothing, he brought in from home.  No voiced complaints.

## 2016-06-13 NOTE — Discharge Summary (Signed)
Physician Discharge Summary  CONARD ALVIRA ZOX:[id number] DOB: [birth year] DOA: 06/11/2016  PCP: Caffie Damme, MD  Admit date: 06/11/2016 Discharge date: 06/13/2016  Admitted From: Home Disposition: Home  Recommendations for Outpatient Follow-up:  1. Follow up with Cardiology on 06/19/2016 2. Please obtain BMP/CBC in one week  Home Health: NA Equipment/Devices:NA  Discharge Condition: Stable CODE STATUS: Full Code Diet recommendation: Diet heart healthy/carb modified Room service appropriate? Yes; Fluid consistency: Thin Diet - low sodium heart healthy  Brief/Interim Summary: Mitchell L Forsteris a 45 y.o.malewith hx significant for CHF, depression, DM, HTN, MI & OSA who presented to [facility] with complaint of SOB.Patient had a disagreement with the staff at his cardiologist's office. He then fired his doctor. A few weeks later patient went to get refills of his heart medication and there were none available. Refills not requested and patient hasbeen out of his medication for less than a week. Patient began to become symptomatic with shortness of breath 3 days ago. Also with a dry cough. Denies fevers, chills, nausea, vomiting. No sick contacts. Shortness of breath got to the point that patient couldn't lay flat when he tried to sleep so he had a friend bring him to the emergency room.  Discharge Diagnoses:  Principal Problem:   Acute respiratory failure with hypoxia (HCC) Active Problems:   HLD (hyperlipidemia)   Obstructive sleep apnea   Essential hypertension   Community acquired bacterial pneumonia   CHF (congestive heart failure) (HCC)   DKA, type 2, not at goal [facility])   Acute CHF exacerbation/Acute Resp Failure with hypoxia -Presented with acute SOB, orthopnea and lower extremity edema. -Elevated BNP at 266 and CXR showing pulmonary vascular congestion. -Noncompliance with heart failure medication for at least the past month. -CHF team consulted, patient started diuresis  already feeling better. -Aldactone discontinued because of history of noncompliance. -Medication change, Coreg from 25-3.125 twice a day. Losartan from 100 down to 25. -Digoxin at 0.125 mg daily and Lasix dose decreased to 60 mg daily instead of 40 twice a day. -Hold on adding BiDil, as cardiology suggested doubt he is getting compliant with that and has its 3 times a day dosing.  Afib with RVR -Not seen on EKG but reported by EDP -Checking Mg & Phos -Unclear if he had documented atrial fibrillation or it was just tachycardia.  Elevated troponin -Elevated troponin of 0.36, this is likely secondary to acute CHF and tachycardia. -Denies any chest pain, EKG without ischemic findings. -Flat curve does not suggest ACS. Continue aspirin and beta blockers.  CAP -Subjective fever only had low-grade fever 99.5 and WBC 11 in the hospital but CT scan suggest pneumonia. -Was on azithromycin and Rocephin while he was in the hospital. -Treated with supportive management with bronchodilators, mucolytics, antitussives and oxygen as needed. -Discharged on Augmentin for 5 more days.  Hypertension -Pressure appears to be controlled with heart failure medications.  DM type II, uncontrolled. -Uncontrolled diabetes mellitus, reported noncompliance with insulin as well. -Hemoglobin A1c is 12.1, correlates with mean plasma glucose of 300. -Restart it on his insulin on discharge.  OSA -CPAP at night  Hyperlipidemia -Total cholesterol 178, LDL is 111 and HDL of 33, continue Lipitor.   Discharge Instructions  Discharge Instructions    Diet - low sodium heart healthy    Complete by:  As directed    Increase activity slowly    Complete by:  As directed        Medication List    STOP taking these  medications   cephALEXin 500 MG capsule Commonly known as:  KEFLEX   cyclobenzaprine 5 MG tablet Commonly known as:  FLEXERIL   lisinopril 2.5 MG tablet Commonly known as:  ZESTRIL    methocarbamol 500 MG tablet Commonly known as:  ROBAXIN   spironolactone 50 MG tablet Commonly known as:  ALDACTONE     TAKE these medications   acetaminophen 500 MG tablet Commonly known as:  TYLENOL Take 1 tablet (500 mg total) by mouth every 6 (six) hours as needed.   amoxicillin-clavulanate 875-125 MG tablet Commonly known as:  AUGMENTIN Take 1 tablet by mouth 2 (two) times daily.   atorvastatin 40 MG tablet Commonly known as:  LIPITOR Take 1 tablet (40 mg total) by mouth at bedtime. What changed:  when to take this   carvedilol 3.125 MG tablet Commonly known as:  COREG Take 1 tablet (3.125 mg total) by mouth 2 (two) times daily with a meal. What changed:  medication strength  how much to take   digoxin 0.125 MG tablet Commonly known as:  LANOXIN Take 1 tablet (0.125 mg total) by mouth daily. What changed:  medication strength  how much to take   furosemide 40 MG tablet Commonly known as:  LASIX Take 1.5 tablets (60 mg total) by mouth daily. What changed:  how much to take  when to take this   HYDROcodone-acetaminophen 5-325 MG tablet Commonly known as:  NORCO/VICODIN Take 1-2 tablets by mouth every 6 (six) hours as needed.   insulin NPH-regular Human (70-30) 100 UNIT/ML injection Commonly known as:  NOVOLIN 70/30 Inject 40 Units into the skin 2 (two) times daily with a meal. What changed:  how much to take   losartan 25 MG tablet Commonly known as:  COZAAR Take 1 tablet (25 mg total) by mouth at bedtime. What changed:  medication strength  how much to take  when to take this   QUEtiapine 100 MG tablet Commonly known as:  SEROQUEL Take 1.5 tablets (150 mg total) by mouth at bedtime.      Follow-up Information    Tonye Becket, NP Follow up on 06/19/2016.   Specialty:  Cardiology Why:  at 10;40 Garage Code 0004  Contact information: [address] [place] [address] [phone]          Allergies  Allergen Reactions  .  Nsaids Anaphylaxis    Consultations: Treatment Team:  Rounding Lbcardiology, MD   Procedures (Echo, Carotid, EGD, Colonoscopy, ERCP)   Radiological studies: Ct Angio Chest Pe W And/or Wo Contrast  Result Date: 06/11/2016 CLINICAL DATA:  Shortness of breath for 3 days but worse tonight. Elevated troponin. History of stroke, congestive heart failure, diabetes, hypertension, PVC, cardiac catheterization. EXAM: CT ANGIOGRAPHY CHEST WITH CONTRAST TECHNIQUE: Multidetector CT imaging of the chest was performed using the standard protocol during bolus administration of intravenous contrast. Multiplanar CT image reconstructions and MIPs were obtained to evaluate the vascular anatomy. CONTRAST:  100 mL Isovue 370 COMPARISON:  None. FINDINGS: Cardiovascular: Examination is somewhat limited by contrast bolus and motion artifact but no significant pulmonary emboli are demonstrated. Normal heart size. Small pericardial effusion. Normal caliber thoracic aorta. Mediastinum/Nodes: Mediastinal lymph nodes are not pathologically enlarged. Esophagus is decompressed. Thyroid gland is unremarkable. Lungs/Pleura: Small bilateral pleural effusions with basilar atelectasis. Mosaic attenuation pattern to the lungs may indicate pulmonary edema. Vague nodular ground-glass infiltrative pattern to the right lung is probably inflammatory. Consider pneumonia or atypical infectious process. No pneumothorax. Airways are patent. Upper Abdomen:  No acute abnormality. Musculoskeletal: No chest wall abnormality. No acute or significant osseous findings. Review of the MIP images confirms the above findings. IMPRESSION: No evidence of significant pulmonary embolus. Small pericardial effusion. Small bilateral pleural effusions with basilar atelectasis. Probable bilateral edema. Patchy nodular ground-glass infiltration in the right lung probably represents pneumonia or atypical infectious process. Electronically Signed   By: Burman NievesWilliam  Stevens  M.D.   On: 06/11/2016 06:14   Dg Chest Port 1 View  Result Date: 06/12/2016 CLINICAL DATA:  Shortness of Breath EXAM: PORTABLE CHEST 1 VIEW COMPARISON:  June 11, 2016 FINDINGS: There is no edema or consolidation. There is cardiomegaly with mild pulmonary venous hypertension. No adenopathy. No bone lesions. IMPRESSION: Pulmonary vascular congestion without frank edema or consolidation. Electronically Signed   By: Bretta BangWilliam  Woodruff III M.D.   On: 06/12/2016 07:05   Dg Chest Port 1 View  Result Date: 06/11/2016 CLINICAL DATA:  Shortness of breath for 3 days, worse tonight. EXAM: PORTABLE CHEST 1 VIEW COMPARISON:  12/06/2015 FINDINGS: Cardiac enlargement with mild pulmonary vascular congestion. Slight interstitial changes in the bases suggesting early edema. No focal consolidation. No pleural effusions. No pneumothorax. Mediastinal contours appear intact. IMPRESSION: Cardiac enlargement with mild pulmonary vascular congestion and mild interstitial edema. Electronically Signed   By: Burman NievesWilliam  Stevens M.D.   On: 06/11/2016 04:44    Subjective:  Discharge Exam: Vitals:   06/12/16 1440 06/12/16 2024 06/13/16 0605 06/13/16 0957  BP:  136/84 (!) 134/98 (!) 145/106  Pulse:  94 98 95  Resp:  18  16  Temp:  98.4 F (36.9 C) 99 F (37.2 C)   TempSrc:  Oral Oral   SpO2:  100% 96% 99%  Weight: 121.6 kg (268 lb 1.3 oz)  125.2 kg (276 lb 1.6 oz)   Height: 6' (1.829 m)      General: Pt is alert, awake, not in acute distress Cardiovascular: RRR, S1/S2 +, no rubs, no gallops Respiratory: CTA bilaterally, no wheezing, no rhonchi Abdominal: Soft, NT, ND, bowel sounds + Extremities: no edema, no cyanosis   The results of significant diagnostics from this hospitalization (including imaging, microbiology, ancillary and laboratory) are listed below for reference.    Microbiology: Recent Results (from the past 240 hour(s))  MRSA PCR Screening     Status: None   Collection Time: 06/11/16  9:23 AM   Result Value Ref Range Status   MRSA by PCR NEGATIVE NEGATIVE Final    Comment:        The GeneXpert MRSA Assay (FDA approved for NASAL specimens only), is one component of a comprehensive MRSA colonization surveillance program. It is not intended to diagnose MRSA infection nor to guide or monitor treatment for MRSA infections.      Labs: BNP (last 3 results)  Recent Labs  10/30/15 0135 12/06/15 2033 06/11/16 0411  BNP 153.6* 368.4* 266.9*   Basic Metabolic Panel:  Recent Labs Lab 06/11/16 0411 06/11/16 0939 06/11/16 0946 06/12/16 0221 06/13/16 0433  NA 138  --   --  138 138  K 3.6  --   --  3.5 3.5  CL 104  --   --  102 101  CO2 25  --   --  28 28  GLUCOSE 352*  --   --  289* 297*  BUN 9  --   --  9 12  CREATININE 0.79  --  0.87 0.90 0.95  CALCIUM 8.9  --   --  8.6* 8.5*  MG  --  1.4*  --   --  1.8  PHOS  --  3.4  --   --   --    Liver Function Tests:  Recent Labs Lab 06/11/16 0411  AST 20  ALT 12*  ALKPHOS 93  BILITOT 0.5  PROT 7.3  ALBUMIN 3.6   No results for input(s): LIPASE, AMYLASE in the last 168 hours. No results for input(s): AMMONIA in the last 168 hours. CBC:  Recent Labs Lab 06/11/16 0411 06/11/16 0946 06/12/16 0221  WBC 11.0* 12.0* 9.1  NEUTROABS 9.1*  --  6.5  HGB 12.4* 12.1* 11.7*  HCT 37.0* 36.2* 35.8*  MCV 89.2 88.3 88.2  PLT 160 173 170   Cardiac Enzymes:  Recent Labs Lab 06/11/16 0411 06/11/16 0939 06/11/16 1542 06/11/16 2103  TROPONINI 0.04* 0.24* 0.36* 0.32*   BNP: Invalid input(s): POCBNP CBG:  Recent Labs Lab 06/12/16 0742 06/12/16 1152 06/12/16 1649 06/12/16 2055 06/13/16 0559  GLUCAP 217* 272* 263* 228* 271*   D-Dimer No results for input(s): DDIMER in the last 72 hours. Hgb A1c  Recent Labs  06/12/16 0221  HGBA1C 12.1*   Lipid Profile  Recent Labs  06/13/16 0433  CHOL 178  HDL 33*  LDLCALC 111*  TRIG 169*  CHOLHDL 5.4   Thyroid function studies No results for input(s):  TSH, T4TOTAL, T3FREE, THYROIDAB in the last 72 hours.  Invalid input(s): FREET3 Anemia work up No results for input(s): VITAMINB12, FOLATE, FERRITIN, TIBC, IRON, RETICCTPCT in the last 72 hours. Urinalysis    Component Value Date/Time   COLORURINE YELLOW 12/08/2014 2355   APPEARANCEUR CLOUDY (A) 12/08/2014 2355   LABSPEC 1.040 (H) 12/08/2014 2355   PHURINE 5.0 12/08/2014 2355   GLUCOSEU >1000 (A) 12/08/2014 2355   HGBUR TRACE (A) 12/08/2014 2355   BILIRUBINUR NEGATIVE 12/08/2014 2355   KETONESUR NEGATIVE 12/08/2014 2355   PROTEINUR NEGATIVE 12/08/2014 2355   UROBILINOGEN 0.2 12/08/2014 2355   NITRITE POSITIVE (A) 12/08/2014 2355   LEUKOCYTESUR TRACE (A) 12/08/2014 2355   Sepsis Labs Invalid input(s): PROCALCITONIN,  WBC,  LACTICIDVEN Microbiology Recent Results (from the past 240 hour(s))  MRSA PCR Screening     Status: None   Collection Time: 06/11/16  9:23 AM  Result Value Ref Range Status   MRSA by PCR NEGATIVE NEGATIVE Final    Comment:        The GeneXpert MRSA Assay (FDA approved for NASAL specimens only), is one component of a comprehensive MRSA colonization surveillance program. It is not intended to diagnose MRSA infection nor to guide or monitor treatment for MRSA infections.      Time coordinating discharge: Over 30 minutes  SIGNED:   Clint Lipps, MD  [facility] 06/13/2016, 10:22 AM Pager   If 7PM-7AM, please contact night-coverage [email or web address] Password [email or web address]

## 2016-06-13 NOTE — Progress Notes (Signed)
Vascular department contacted x 2 and order for 2D Echo modified with text that patient is scheduled for discharge.  Patient informed that contacts have been made and he will be ready for discharge after exam completed.  Patient stated understanding.

## 2016-06-13 NOTE — Progress Notes (Signed)
Inpatient Diabetes Program Recommendations  AACE/ADA: New Consensus Statement on Inpatient Glycemic Control (2015)  Target Ranges:  Prepandial:   less than 140 mg/dL      Peak postprandial:   less than 180 mg/dL (1-2 hours)      Critically ill patients:  140 - 180 mg/dL   Lab Results  Component Value Date   GLUCAP 256 (H) 06/13/2016   HGBA1C 12.1 (H) 06/12/2016    Review of Glycemic Control Results for Mitchell [name], Mitchell [name] (MRN [id number]) as of 06/13/2016 12:23  Ref. Range 06/12/2016 07:42 06/12/2016 11:52 06/12/2016 16:49 06/12/2016 20:55 06/13/2016 05:59 06/13/2016 11:34  Glucose-Capillary Latest Ref Range: 65 - 99 mg/dL 357 (H) 017 (H) 793 (H) 228 (H) 271 (H) 256 (H)   Inpatient Diabetes Program Recommendations:   Spoke with pt about A1C results 12.1 and explained what an A1C is, basic pathophysiology of DM Type 2, basic home care, basic diabetes diet nutrition principles, importance of checking CBGs and maintaining good CBG control to prevent long-term and short-term complications. Gave handout on elevated A1c and plate method. Reviewed signs and symptoms of hyperglycemia and hypoglycemia and how to treat hypoglycemia at home. Also reviewed blood sugar goals at home.  Noted patient is to be discharged home soon.  Thank you, [name]. [name], RN, MSN, CDE Inpatient Glycemic Control Team Team Pager [phone] (8am-5pm) 06/13/2016 12:25 PM

## 2016-06-13 NOTE — Progress Notes (Signed)
Transported to Vascular lab for 2D-Echo.

## 2016-06-13 NOTE — Progress Notes (Signed)
  Echocardiogram 2D Echocardiogram with Definity has been performed.  Leta Jungling M 06/13/2016, 1:58 PM

## 2016-06-16 DIAGNOSIS — G4730 Sleep apnea, unspecified: Secondary | ICD-10-CM | POA: Diagnosis not present

## 2016-06-16 DIAGNOSIS — I252 Old myocardial infarction: Secondary | ICD-10-CM | POA: Diagnosis not present

## 2016-06-16 DIAGNOSIS — E11621 Type 2 diabetes mellitus with foot ulcer: Secondary | ICD-10-CM | POA: Diagnosis not present

## 2016-06-16 DIAGNOSIS — L97512 Non-pressure chronic ulcer of other part of right foot with fat layer exposed: Secondary | ICD-10-CM | POA: Diagnosis not present

## 2016-06-16 DIAGNOSIS — I10 Essential (primary) hypertension: Secondary | ICD-10-CM | POA: Diagnosis not present

## 2016-06-19 ENCOUNTER — Ambulatory Visit (HOSPITAL_COMMUNITY)
Admit: 2016-06-19 | Discharge: 2016-06-19 | Disposition: A | Discharge status: Home / Self Care | Payer: No Typology Code available for payment source | Attending: Cardiology | Admitting: Cardiology

## 2016-06-19 ENCOUNTER — Encounter (HOSPITAL_COMMUNITY): Payer: Self-pay

## 2016-06-19 VITALS — BP 138/78 | HR 87 | Wt 283.1 lb

## 2016-06-19 DIAGNOSIS — F209 Schizophrenia, unspecified: Secondary | ICD-10-CM | POA: Diagnosis not present

## 2016-06-19 DIAGNOSIS — Z91199 Patient's noncompliance with other medical treatment and regimen due to unspecified reason: Secondary | ICD-10-CM

## 2016-06-19 DIAGNOSIS — G4733 Obstructive sleep apnea (adult) (pediatric): Secondary | ICD-10-CM | POA: Diagnosis not present

## 2016-06-19 DIAGNOSIS — Z9119 Patient's noncompliance with other medical treatment and regimen: Secondary | ICD-10-CM | POA: Insufficient documentation

## 2016-06-19 DIAGNOSIS — E669 Obesity, unspecified: Secondary | ICD-10-CM | POA: Diagnosis not present

## 2016-06-19 DIAGNOSIS — I5022 Chronic systolic (congestive) heart failure: Secondary | ICD-10-CM | POA: Insufficient documentation

## 2016-06-19 DIAGNOSIS — Z8673 Personal history of transient ischemic attack (TIA), and cerebral infarction without residual deficits: Secondary | ICD-10-CM | POA: Insufficient documentation

## 2016-06-19 DIAGNOSIS — I110 Hypertensive heart disease with heart failure: Secondary | ICD-10-CM | POA: Insufficient documentation

## 2016-06-19 DIAGNOSIS — E785 Hyperlipidemia, unspecified: Secondary | ICD-10-CM | POA: Diagnosis not present

## 2016-06-19 DIAGNOSIS — E1165 Type 2 diabetes mellitus with hyperglycemia: Secondary | ICD-10-CM | POA: Diagnosis not present

## 2016-06-19 DIAGNOSIS — N529 Male erectile dysfunction, unspecified: Secondary | ICD-10-CM | POA: Diagnosis not present

## 2016-06-19 DIAGNOSIS — E1122 Type 2 diabetes mellitus with diabetic chronic kidney disease: Secondary | ICD-10-CM

## 2016-06-19 DIAGNOSIS — I493 Ventricular premature depolarization: Secondary | ICD-10-CM | POA: Diagnosis not present

## 2016-06-19 DIAGNOSIS — I340 Nonrheumatic mitral (valve) insufficiency: Secondary | ICD-10-CM | POA: Insufficient documentation

## 2016-06-19 DIAGNOSIS — Z794 Long term (current) use of insulin: Secondary | ICD-10-CM | POA: Diagnosis not present

## 2016-06-19 DIAGNOSIS — F319 Bipolar disorder, unspecified: Secondary | ICD-10-CM | POA: Insufficient documentation

## 2016-06-19 DIAGNOSIS — I252 Old myocardial infarction: Secondary | ICD-10-CM | POA: Diagnosis not present

## 2016-06-19 DIAGNOSIS — I429 Cardiomyopathy, unspecified: Secondary | ICD-10-CM | POA: Diagnosis not present

## 2016-06-19 DIAGNOSIS — E11621 Type 2 diabetes mellitus with foot ulcer: Secondary | ICD-10-CM | POA: Diagnosis not present

## 2016-06-19 LAB — BASIC METABOLIC PANEL
Anion gap: 7 (ref 5–15)
BUN: 12 mg/dL (ref 6–20)
CALCIUM: 9 mg/dL (ref 8.9–10.3)
CO2: 30 mmol/L (ref 22–32)
CREATININE: 0.86 mg/dL (ref 0.61–1.24)
Chloride: 103 mmol/L (ref 101–111)
GFR calc Af Amer: >60 mL/min (ref >60)
GLUCOSE: 148 mg/dL — AB (ref 65–99)
POTASSIUM: 4.1 mmol/L (ref 3.5–5.1)
SODIUM: 140 mmol/L (ref 135–145)

## 2016-06-19 MED ORDER — SACUBITRIL-VALSARTAN 24-26 MG PO TABS: 1.0000 | ORAL_TABLET | Freq: Two times a day (BID) | ORAL | 3 refills | Status: DC

## 2016-06-19 NOTE — Progress Notes (Signed)
Patient ID: Mitchell [name], male   DOB: [birth year], 45 y.o.   MRN: [id number]  PCP: None  Mental Health: [facility]  Pulmonologist: dr Shelle Iron  HPI:  Mitchell [name] is a 45 year old male with multiple medical problems including obesity, uncontrolled hypertension, uncontrolled diabetes, obstructive sleep apnea, bipolar disease/schizophrenia, CVA, nonobstructive coronary disease and systolic congestive heart failure secondary to nonischemic cardiomyopathy.He is S/P multiple tooth extractions 01/27/14.   Admitted to [facility] 6/10 through 01/09/14 with uncontrolled diabetes. Started back on insulin. He was also referred to Dr Kristin Bruins for teeth extractions. Bidil stopped due to suspected syncope. Discharge weight 276 pounds.   Admitted to [facility] 05/2016 with volume overload and possible pneumonia. Treated with antibiotics. He had been off HF meds for a few weeks. Diuresed with IV lasix and HF meds restarted. Discharge weight was 276 pounds.   He returns for HF post hospital follow up with his wife. Overall feeling ok. Denies SOB/PND/Orthopnea. Weight at home 277-279 pounds. In the past he had CPAP but it was stopped by former PCP a few years ago. Eating high salt foods.  Taking all medications. Says he does not have PCP.   09/23/11: 2D echo checked with reduction in LVEF to 15-20% with mild to moderate mitral valve regurgitation ECHO 10/30/12 EF 20-25%  ECHO 11/22/13 EF 20-25%  ECHO 05/2016: EF 2-25%.   Labs 01/09/14 Pro BNP 235  Labs 01/09/14 K 3.6 Creatinine 0.73  Labvs 01/28/14 K 4.2 Creatinine 0.89 Labs 03/25/14 K 3.3 Creatinine 0.99 Labs 05/2016: K 3.5 Creatinine.95   SH: Lives at home with wife and children. Does not smoke or drink alcohol. FH: Mom had heart diease  ROS: As per HPI otherwise negative  Past Medical History:  Diagnosis Date  . Bipolar disorder (HCC)   . CHF (congestive heart failure) (HCC)   . Chronic systolic heart failure (HCC) 11/2010   a. NICM 4/14 EF 20-25%, TR mild  . CVA (cerebral  infarction)    No residual deficits  . Depression    PTSD,   . Diabetes mellitus    TYPE II; UNCONTROLLED BY HEMOGLOBIN A1c; STABLE AS  PER DISCHARGE  . Headache(784.0)   . 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 History of colonic polyps   . Hyperlipidemia   . Hypertension   . Myocardial infarction    at age 25 years old (while playing football)  . Non-ischemic cardiomyopathy (HCC)    No ischemia on myoview, showed EF of 42%. 2D echo showed EF 50-55% with diatolic dysfunction in 2009  . Obesity   . OSA (obstructive sleep apnea)    Being set up again for c-pap  . Pneumonia   . Post-cardiac injury syndrome (HCC)    History of cardiac injury from blunt trauma  . PVCs (premature ventricular contractions)   . Schizophrenia (HCC)    Goes to [facility]  . SOB (shortness of breath)   . Stroke [facility]) 2005   some left side weakness  . Syncope    Recurrent, thought to be vasovagal. Also has h/o frequent PVCs.     Current Outpatient Prescriptions  Medication Sig Dispense Refill  . amoxicillin-clavulanate (AUGMENTIN) 875-125 MG tablet Take 1 tablet by mouth 2 (two) times daily. 10 tablet 0  . atorvastatin (LIPITOR) 40 MG tablet Take 1 tablet (40 mg total) by mouth at bedtime. 30 tablet 0  . carvedilol (COREG) 3.125 MG tablet Take 1 tablet (3.125 mg total) by mouth 2 (two) times daily with a meal.  60 tablet 0  . digoxin (LANOXIN) 0.125 MG tablet Take 1 tablet (0.125 mg total) by mouth daily. 30 tablet 0  . furosemide (LASIX) 40 MG tablet Take 1.5 tablets (60 mg total) by mouth daily. 60 tablet 0  . insulin NPH-regular Human (NOVOLIN 70/30) (70-30) 100 UNIT/ML injection Inject 40 Units into the skin 2 (two) times daily with a meal. (Patient taking differently: Inject 45 Units into the skin 2 (two) times daily with a meal. ) 10 mL 1  . losartan (COZAAR) 25 MG tablet Take 1 tablet (25 mg total) by mouth at bedtime. 30 tablet 0  . QUEtiapine (SEROQUEL) 100 MG tablet Take 1.5  tablets (150 mg total) by mouth at bedtime. 30 tablet 0  . acetaminophen (TYLENOL) 500 MG tablet Take 1 tablet (500 mg total) by mouth every 6 (six) hours as needed. (Patient not taking: Reported on 06/19/2016) 30 tablet 0   No current facility-administered medications for this encounter.     Allergies  Allergen Reactions  . Nsaids Anaphylaxis    PHYSICAL EXAM: Vitals:   06/19/16 1046  BP: 138/78  Pulse: 87  SpO2: 99%  Weight: 283 lb 2 oz (128.4 kg)    General:  Well appearing. No respiratory difficulty HEENT: normal. No teeth.  Neck: supple. JVP ~10 Carotids 2+ bilat; no bruits. No lymphadenopathy or thryomegaly appreciated. Facia edema improved. Neck ecchymotic.  Cor: PMI nondisplaced. Tachycardic & regular rhythm. No rubs, or murmurs. + S3 Lungs: clear Abdomen: Obese, soft, nontender, distended. No hepatosplenomegaly. No bruits or masses. Good bowel sounds. Extremities: no cyanosis, clubbing, rash, edema Neuro: alert & oriented x 3, cranial nerves grossly intact. moves all 4 extremities w/o difficulty. Affect pleasant.  ASSESSMENT & PLAN: 1. H/O Pneumonia- 05/2016. Completed antibiotic course.  2.Chronic Systolic Heart Failure - NICM LHC 2012 Non Obstructive CAD. In the past he refused ICD. Most recent ECHO 2017 EF 20-25%.  NYHA II. Optimize HF meds then repeat ECHO. If remains low can consider ICD referral.  Volume status stable. Continue lasix 60 mg daily.  Continue  carvedilol 3.125 mg twice a day + digoxin 0.125 mg daily.  Stop losartan and add entresto 24-26 mg twice a day.  Hold off on bidil because I am doubtful he will comply with three times a day dosing.Hold off on spiro with ongoing noncompliance.  Can consider spiro next visit.  BMET today.  3. DMII- Uncontrolled- Hgb A1C 06/12/2016 12.1 %. Refer to PCP.   4. RLE Diabetic Foot Ulcer- Total Cast in place. [facility] 5. OSA- Refer for sleep study with CPAP if needed.  6. Noncompliance-  Stressed importance of medication.   7. Erectile Dysfunction- Refer to Urology  Follow up in  2 weeks.  Kianni Lheureux NP-C 10:51 AM

## 2016-06-19 NOTE — Addendum Note (Signed)
Encounter addended by: Marcy Siren, LCSW on: 06/19/2016  2:27 PM<BR>    Actions taken: Sign clinical note

## 2016-06-19 NOTE — Progress Notes (Signed)
CSW referred to assist with PCP. Patient reports he had a PCP although had some challenges that he would prefer to not return. Patient states he often goes to the Med Center in [facility] and would like to go for Primary care there. CSW provided patient with information and number of [facility]. Patient verbalizes understanding and will follow up with obtaining appointment. CSW available as needed. Lasandra Beech, LCSW [phone]

## 2016-06-19 NOTE — Patient Instructions (Signed)
Stop Losartan  Start Entresto 24/26 mg Twice daily   Lab today  Your physician has recommended that you have a sleep study. This test records several body functions during sleep, including: brain activity, eye movement, oxygen and carbon dioxide blood levels, heart rate and rhythm, breathing rate and rhythm, the flow of air through your mouth and nose, snoring, body muscle movements, and chest and belly movement.  You have been referred to Urology  Your physician recommends that you schedule a follow-up appointment in: 2 weeks

## 2016-06-20 ENCOUNTER — Telehealth (HOSPITAL_COMMUNITY): Payer: Self-pay | Admitting: Pharmacist

## 2016-06-20 NOTE — Telephone Encounter (Signed)
Entresto 24-26 mg BID PA approved by [employer] through 06/20/19.  Tyler Deis. Bonnye Fava, PharmD, BCPS, CPP Clinical Pharmacist Pager: [phone] Phone: [phone] 06/20/2016 3:47 PM

## 2016-06-30 ENCOUNTER — Encounter (HOSPITAL_BASED_OUTPATIENT_CLINIC_OR_DEPARTMENT_OTHER): Payer: No Typology Code available for payment source | Attending: Internal Medicine

## 2016-06-30 DIAGNOSIS — Z8631 Personal history of diabetic foot ulcer: Secondary | ICD-10-CM | POA: Diagnosis not present

## 2016-06-30 DIAGNOSIS — I10 Essential (primary) hypertension: Secondary | ICD-10-CM | POA: Insufficient documentation

## 2016-06-30 DIAGNOSIS — Z09 Encounter for follow-up examination after completed treatment for conditions other than malignant neoplasm: Secondary | ICD-10-CM | POA: Diagnosis not present

## 2016-06-30 DIAGNOSIS — E119 Type 2 diabetes mellitus without complications: Secondary | ICD-10-CM | POA: Diagnosis not present

## 2016-06-30 DIAGNOSIS — I252 Old myocardial infarction: Secondary | ICD-10-CM | POA: Diagnosis not present

## 2016-07-03 ENCOUNTER — Ambulatory Visit (HOSPITAL_COMMUNITY)
Admission: RE | Admit: 2016-07-03 | Discharge: 2016-07-03 | Disposition: A | Discharge status: Home / Self Care | Payer: No Typology Code available for payment source | Source: Ambulatory Visit | Attending: Cardiology | Admitting: Cardiology

## 2016-07-03 VITALS — BP 176/102 | HR 98 | Wt 289.0 lb

## 2016-07-03 DIAGNOSIS — I252 Old myocardial infarction: Secondary | ICD-10-CM | POA: Insufficient documentation

## 2016-07-03 DIAGNOSIS — I428 Other cardiomyopathies: Secondary | ICD-10-CM

## 2016-07-03 DIAGNOSIS — G4733 Obstructive sleep apnea (adult) (pediatric): Secondary | ICD-10-CM | POA: Insufficient documentation

## 2016-07-03 DIAGNOSIS — E785 Hyperlipidemia, unspecified: Secondary | ICD-10-CM | POA: Insufficient documentation

## 2016-07-03 DIAGNOSIS — Z9119 Patient's noncompliance with other medical treatment and regimen: Secondary | ICD-10-CM | POA: Diagnosis not present

## 2016-07-03 DIAGNOSIS — I110 Hypertensive heart disease with heart failure: Secondary | ICD-10-CM | POA: Insufficient documentation

## 2016-07-03 DIAGNOSIS — Z8673 Personal history of transient ischemic attack (TIA), and cerebral infarction without residual deficits: Secondary | ICD-10-CM | POA: Insufficient documentation

## 2016-07-03 DIAGNOSIS — F209 Schizophrenia, unspecified: Secondary | ICD-10-CM | POA: Diagnosis not present

## 2016-07-03 DIAGNOSIS — E669 Obesity, unspecified: Secondary | ICD-10-CM | POA: Diagnosis not present

## 2016-07-03 DIAGNOSIS — E1165 Type 2 diabetes mellitus with hyperglycemia: Secondary | ICD-10-CM | POA: Insufficient documentation

## 2016-07-03 DIAGNOSIS — I429 Cardiomyopathy, unspecified: Secondary | ICD-10-CM | POA: Diagnosis not present

## 2016-07-03 DIAGNOSIS — I493 Ventricular premature depolarization: Secondary | ICD-10-CM | POA: Diagnosis not present

## 2016-07-03 DIAGNOSIS — Z794 Long term (current) use of insulin: Secondary | ICD-10-CM | POA: Diagnosis not present

## 2016-07-03 DIAGNOSIS — F319 Bipolar disorder, unspecified: Secondary | ICD-10-CM | POA: Diagnosis not present

## 2016-07-03 DIAGNOSIS — I340 Nonrheumatic mitral (valve) insufficiency: Secondary | ICD-10-CM | POA: Insufficient documentation

## 2016-07-03 DIAGNOSIS — I5022 Chronic systolic (congestive) heart failure: Secondary | ICD-10-CM | POA: Diagnosis not present

## 2016-07-03 DIAGNOSIS — F4310 Post-traumatic stress disorder, unspecified: Secondary | ICD-10-CM | POA: Diagnosis not present

## 2016-07-03 DIAGNOSIS — E11621 Type 2 diabetes mellitus with foot ulcer: Secondary | ICD-10-CM | POA: Diagnosis not present

## 2016-07-03 DIAGNOSIS — I10 Essential (primary) hypertension: Secondary | ICD-10-CM

## 2016-07-03 DIAGNOSIS — E1122 Type 2 diabetes mellitus with diabetic chronic kidney disease: Secondary | ICD-10-CM

## 2016-07-03 DIAGNOSIS — N529 Male erectile dysfunction, unspecified: Secondary | ICD-10-CM | POA: Diagnosis not present

## 2016-07-03 DIAGNOSIS — Z9114 Patient's other noncompliance with medication regimen: Secondary | ICD-10-CM | POA: Diagnosis not present

## 2016-07-03 MED ORDER — CARVEDILOL 6.25 MG PO TABS: 6.2500 mg | ORAL_TABLET | Freq: Two times a day (BID) | ORAL | 3 refills | Status: DC

## 2016-07-03 MED ORDER — SACUBITRIL-VALSARTAN 49-51 MG PO TABS: 1.0000 | ORAL_TABLET | Freq: Two times a day (BID) | ORAL | 3 refills | Status: DC

## 2016-07-03 NOTE — Progress Notes (Signed)
Patient ID: Mitchell [name], male   DOB: [birth year], 45 y.o.   MRN: [id number]  PCP: None  Mental Health: [facility]  Pulmonologist: dr Shelle Iron  HPI:  [name] is a 45 year old male with multiple medical problems including obesity, uncontrolled hypertension, uncontrolled diabetes, obstructive sleep apnea, bipolar disease/schizophrenia, CVA, nonobstructive coronary disease and systolic congestive heart failure secondary to nonischemic cardiomyopathy.He is S/P multiple tooth extractions 01/27/14.   Admitted to [facility] 6/10 through 01/09/14 with uncontrolled diabetes. Started back on insulin. He was also referred to Dr Kristin Bruins for teeth extractions. Bidil stopped due to suspected syncope. Discharge weight 276 pounds.   Admitted to [facility] 05/2016 with volume overload and possible pneumonia. Treated with antibiotics. He had been off HF meds for a few weeks. Diuresed with IV lasix and HF meds restarted. Discharge weight was 276 pounds.   He returns for HF follow up for HF. Last visit losartan stopped and entresto was stopped. He has has set up sleep study January 14th. Overall feeling much better. Denies SOB/PND/Orthopnea. Not exercising. Weight at home 278-284 pounds. Taking all medications. Says he has not missed any doses on medications.    09/23/11: 2D echo checked with reduction in LVEF to 15-20% with mild to moderate mitral valve regurgitation ECHO 10/30/12 EF 20-25%  ECHO 11/22/13 EF 20-25%  ECHO 05/2016: EF 20-25%.   Labs 01/09/14 Pro BNP 235  Labs 01/09/14 K 3.6 Creatinine 0.73  Labvs 01/28/14 K 4.2 Creatinine 0.89 Labs 03/25/14 K 3.3 Creatinine 0.99 Labs 05/2016: K 3.5 Creatinine.95  Labs 06/19/2016: K 4.1 Creatinine 0.86   SH: Lives at home with wife and children. Does not smoke or drink alcohol. FH: Mom had heart diease  ROS: As per HPI otherwise negative  Past Medical History:  Diagnosis Date  . Bipolar disorder (HCC)   . CHF (congestive heart failure) (HCC)   . Chronic systolic heart failure  (HCC) 09/2023   a. NICM 4/14 EF 20-25%, TR mild  . CVA (cerebral infarction)    No residual deficits  . Depression    PTSD,   . Diabetes mellitus    TYPE II; UNCONTROLLED BY HEMOGLOBIN A1c; STABLE AS  PER DISCHARGE  . Headache(784.0)   . 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 History of colonic polyps   . Hyperlipidemia   . Hypertension   . Myocardial infarction    at age 66 years old (while playing football)  . Non-ischemic cardiomyopathy (HCC)    No ischemia on myoview, showed EF of 42%. 2D echo showed EF 50-55% with diatolic dysfunction in 2009  . Obesity   . OSA (obstructive sleep apnea)    Being set up again for c-pap  . Pneumonia   . Post-cardiac injury syndrome (HCC)    History of cardiac injury from blunt trauma  . PVCs (premature ventricular contractions)   . Schizophrenia (HCC)    Goes to [facility]  . SOB (shortness of breath)   . Stroke [facility]) 2005   some left side weakness  . Syncope    Recurrent, thought to be vasovagal. Also has h/o frequent PVCs.     Current Outpatient Prescriptions  Medication Sig Dispense Refill  . acetaminophen (TYLENOL) 500 MG tablet Take 1 tablet (500 mg total) by mouth every 6 (six) hours as needed. 30 tablet 0  . atorvastatin (LIPITOR) 40 MG tablet Take 1 tablet (40 mg total) by mouth at bedtime. 30 tablet 0  . carvedilol (COREG) 3.125 MG tablet Take 1 tablet (3.125  mg total) by mouth 2 (two) times daily with a meal. 60 tablet 0  . digoxin (LANOXIN) 0.125 MG tablet Take 1 tablet (0.125 mg total) by mouth daily. 30 tablet 0  . furosemide (LASIX) 40 MG tablet Take 1.5 tablets (60 mg total) by mouth daily. 60 tablet 0  . insulin NPH-regular Human (NOVOLIN 70/30) (70-30) 100 UNIT/ML injection Inject 45 Units into the skin 2 (two) times daily.    [name] Kitchen. losartan (COZAAR) 25 MG tablet Take 1 tablet (25 mg total) by mouth at bedtime. 30 tablet 0  . QUEtiapine (SEROQUEL) 100 MG tablet Take 1.5 tablets (150 mg total) by mouth at  bedtime. 30 tablet 0  . sacubitril-valsartan (ENTRESTO) 24-26 MG Take 1 tablet by mouth 2 (two) times daily. 60 tablet 3   No current facility-administered medications for this encounter.     Allergies  Allergen Reactions  . Nsaids Anaphylaxis    PHYSICAL EXAM: Vitals:   07/03/16 0926  BP: (!) 176/102  BP Location: Left Arm  Patient Position: Sitting  Cuff Size: Normal  Pulse: 98  SpO2: 100%  Weight: 289 lb (131.1 kg)   Filed Weights   07/03/16 0926  Weight: 289 lb (131.1 kg)   General:  Well appearing. No respiratory difficulty. Ambulated in the clinic without difficulty.  HEENT: normal. No teeth.  Neck: supple. JVP 8-9.  Carotids 2+ bilat; no bruits. No lymphadenopathy or thryomegaly appreciated.  Cor: PMI nondisplaced. Regular rhythm. No rubs, or murmurs. Soft S3 Lungs: clear Abdomen: Obese, soft, nontender, distended. No hepatosplenomegaly. No bruits or masses. Good bowel sounds. Extremities: no cyanosis, clubbing, rash, edema Neuro: alert & oriented x 3, cranial nerves grossly intact. moves all 4 extremities w/o difficulty. Affect pleasant.  ASSESSMENT & PLAN: 1. H/O Pneumonia- 05/2016. Completed antibiotic course.  2.Chronic Systolic Heart Failure - NICM LHC 2012 Non Obstructive CAD. In the past he refused ICD. Most recent ECHO 05/2016 EF 20-25%.  NYHA II. Optimize HF meds then repeat ECHO. If remains low can consider ICD referral. Congratulated on medication compliance.  Volume status stable. Continue lasix 60 mg daily. Increase carvedilol to 6.25 mg twice a day. Continue digoxin 0.125 mg daily. Dig level <0.2 on 06/11/2016 Increase entresto 49-51 mg twice a day.   Hold off on bidil because I am doubtful he will comply with three times a day dosing.Hold off on spiro with ongoing noncompliance. Can consider spiro not month. BMET in 10 days.   3. DMII- Uncontrolled- Hgb A1C 06/12/2016 12.1 %. PCP managing.    4. RLE Diabetic Foot Ulcer- [facility] 5. OSA- He has sleep study set up January 14th.  6. Noncompliance- Stressed importance of medication.   7. Erectile Dysfunction- Refer to Urology  Follow up in  2 weeks with BMET. Follow up in 4 weeks. Anticipate adding spiro.  Pantelis Elgersma NP-C 9:20 AM

## 2016-07-03 NOTE — Progress Notes (Signed)
Advanced Heart Failure Medication Review by a Pharmacist  Does the patient  feel that his/her medications are working for him/her?  yes  Has the patient been experiencing any side effects to the medications prescribed?  no  Does the patient measure his/her own blood pressure or blood glucose at home?  yes   Does the patient have any problems obtaining medications due to transportation or finances?   no  Understanding of regimen: good Understanding of indications: good Potential of compliance: good Patient understands to avoid NSAIDs. Patient understands to avoid decongestants.  Issues to address at subsequent visits: None   Pharmacist comments:  Mitchell [name] is a pleasant 45 yo M presenting without a medication list but with good recall of his regimen. He reports good compliance with his medications and did not have any specific medication-related questions or concerns for me at this time.   Tyler Deis. Bonnye Fava, PharmD, BCPS, CPP Clinical Pharmacist Pager: [phone] Phone: [phone] 07/03/2016 9:41 AM      Time with patient: 10 minutes Preparation and documentation time: 2 minutes Total time: 12 minutes

## 2016-07-03 NOTE — Patient Instructions (Signed)
INCREASE Entresto to 49/51 mg tablet twice daily. Use monthly copay card.  INCREASE Carvedilol (Coreg) to 6.25 mg tablet twice daily. Can "double up" on your 3.125 mg tablets until you run out (2 tabs twice daily), new Rx will be a 6.25 mg tablet (1 tab twice daily).  Return in 10 days for lab work.  Follow up 4 weeks with Mitchell Clegg NP-C.  Do the following things EVERYDAY: 1) Weigh yourself in the morning before breakfast. Write it down and keep it in a log. 2) Take your medicines as prescribed 3) Eat low salt foods-Limit salt (sodium) to 2000 mg per day.  4) Stay as active as you can everyday 5) Limit all fluids for the day to less than 2 liters

## 2016-07-13 ENCOUNTER — Ambulatory Visit (HOSPITAL_COMMUNITY)
Admission: RE | Admit: 2016-07-13 | Discharge: 2016-07-13 | Disposition: A | Discharge status: Home / Self Care | Payer: No Typology Code available for payment source | Source: Ambulatory Visit | Attending: Cardiology | Admitting: Cardiology

## 2016-07-13 ENCOUNTER — Other Ambulatory Visit (HOSPITAL_COMMUNITY): Payer: Self-pay | Admitting: Cardiology

## 2016-07-13 DIAGNOSIS — I5022 Chronic systolic (congestive) heart failure: Secondary | ICD-10-CM

## 2016-07-13 LAB — BASIC METABOLIC PANEL
ANION GAP: 8 (ref 5–15)
BUN: 11 mg/dL (ref 6–20)
CALCIUM: 9.1 mg/dL (ref 8.9–10.3)
CO2: 29 mmol/L (ref 22–32)
Chloride: 103 mmol/L (ref 101–111)
Creatinine, Ser: 1.02 mg/dL (ref 0.61–1.24)
GFR calc Af Amer: >60 mL/min (ref >60)
GLUCOSE: 316 mg/dL — AB (ref 65–99)
POTASSIUM: 4.8 mmol/L (ref 3.5–5.1)
SODIUM: 140 mmol/L (ref 135–145)

## 2016-07-13 MED ORDER — FUROSEMIDE 40 MG PO TABS: 60.0000 mg | ORAL_TABLET | Freq: Every day | ORAL | 2 refills | Status: DC

## 2016-07-13 MED ORDER — DIGOXIN 125 MCG PO TABS: 0.1250 mg | ORAL_TABLET | Freq: Every day | ORAL | 2 refills | Status: DC

## 2016-07-13 MED ORDER — ATORVASTATIN CALCIUM 40 MG PO TABS: 40.0000 mg | ORAL_TABLET | Freq: Every day | ORAL | 2 refills | Status: DC

## 2016-07-13 NOTE — Telephone Encounter (Signed)
Patient walked in to request refills on insulin, digoxin, atorvastatin, digoxin and lasix Above meds minus insulin sent to pharmacy advised patient to follow up with pcp. Patient states he does not have a pcp and was told we would assist. Social worker out of office and will send another message for her to address once she returns however will need to continue to pay out of pocket for insulin ($20) as he has done in the past

## 2016-07-17 ENCOUNTER — Telehealth: Payer: Self-pay | Admitting: Licensed Clinical Social Worker

## 2016-07-17 ENCOUNTER — Telehealth (HOSPITAL_COMMUNITY): Payer: Self-pay

## 2016-07-17 NOTE — Telephone Encounter (Signed)
Basic metabolic panel  Order: [id number]  Status:  Final result Visible to patient:  No (Not Released) Dx:  Chronic systolic heart failure (HCC)  Notes Recorded by Chyrl Civatte, RN on 07/17/2016 at 9:17 AM EST Patient does state he is in transition between PCPs. Will see if Caffie Damme can still address, encouraged to call their office until he can be established at new PCP. ------  Notes Recorded by Chyrl Civatte, RN on 07/17/2016 at 9:15 AM EST Patient aware, results forwarded to PCP as well via fax (unable to forward in epic) ------  Notes Recorded by Sherald Hess, NP on 07/13/2016 at 3:12 PM EST Please call glucose elevated. Needs follow up with PCP. Renal function stable.

## 2016-07-17 NOTE — Telephone Encounter (Signed)
CSW received referral to assist patient with obtaining a PCP. CSW contacted patient who states he contacted [facility] and has an appointment with primary care. Patient denies any other needs at this time. CSW will be available as needed. Lasandra Beech, LCSW [phone]

## 2016-08-03 ENCOUNTER — Encounter (HOSPITAL_COMMUNITY): Payer: No Typology Code available for payment source

## 2016-08-11 ENCOUNTER — Ambulatory Visit (HOSPITAL_BASED_OUTPATIENT_CLINIC_OR_DEPARTMENT_OTHER): Payer: No Typology Code available for payment source | Attending: Adult Health | Admitting: Cardiology

## 2016-08-11 DIAGNOSIS — G4733 Obstructive sleep apnea (adult) (pediatric): Secondary | ICD-10-CM | POA: Diagnosis not present

## 2016-08-14 ENCOUNTER — Encounter (HOSPITAL_COMMUNITY): Payer: Self-pay

## 2016-08-14 ENCOUNTER — Ambulatory Visit (HOSPITAL_COMMUNITY)
Admission: RE | Admit: 2016-08-14 | Discharge: 2016-08-14 | Disposition: A | Discharge status: Home / Self Care | Payer: No Typology Code available for payment source | Source: Ambulatory Visit | Attending: Cardiology | Admitting: Cardiology

## 2016-08-14 VITALS — BP 172/98 | HR 79 | Wt 290.8 lb

## 2016-08-14 DIAGNOSIS — F319 Bipolar disorder, unspecified: Secondary | ICD-10-CM | POA: Insufficient documentation

## 2016-08-14 DIAGNOSIS — E669 Obesity, unspecified: Secondary | ICD-10-CM | POA: Diagnosis not present

## 2016-08-14 DIAGNOSIS — Z9114 Patient's other noncompliance with medication regimen: Secondary | ICD-10-CM | POA: Insufficient documentation

## 2016-08-14 DIAGNOSIS — I493 Ventricular premature depolarization: Secondary | ICD-10-CM | POA: Insufficient documentation

## 2016-08-14 DIAGNOSIS — F209 Schizophrenia, unspecified: Secondary | ICD-10-CM | POA: Diagnosis not present

## 2016-08-14 DIAGNOSIS — E1122 Type 2 diabetes mellitus with diabetic chronic kidney disease: Secondary | ICD-10-CM

## 2016-08-14 DIAGNOSIS — I428 Other cardiomyopathies: Secondary | ICD-10-CM

## 2016-08-14 DIAGNOSIS — E1165 Type 2 diabetes mellitus with hyperglycemia: Secondary | ICD-10-CM

## 2016-08-14 DIAGNOSIS — R0602 Shortness of breath: Secondary | ICD-10-CM | POA: Diagnosis not present

## 2016-08-14 DIAGNOSIS — G4733 Obstructive sleep apnea (adult) (pediatric): Secondary | ICD-10-CM | POA: Insufficient documentation

## 2016-08-14 DIAGNOSIS — I110 Hypertensive heart disease with heart failure: Secondary | ICD-10-CM | POA: Insufficient documentation

## 2016-08-14 DIAGNOSIS — E785 Hyperlipidemia, unspecified: Secondary | ICD-10-CM | POA: Insufficient documentation

## 2016-08-14 DIAGNOSIS — I252 Old myocardial infarction: Secondary | ICD-10-CM | POA: Insufficient documentation

## 2016-08-14 DIAGNOSIS — E11621 Type 2 diabetes mellitus with foot ulcer: Secondary | ICD-10-CM | POA: Diagnosis not present

## 2016-08-14 DIAGNOSIS — Z794 Long term (current) use of insulin: Secondary | ICD-10-CM | POA: Diagnosis not present

## 2016-08-14 DIAGNOSIS — F4310 Post-traumatic stress disorder, unspecified: Secondary | ICD-10-CM | POA: Diagnosis not present

## 2016-08-14 DIAGNOSIS — I5022 Chronic systolic (congestive) heart failure: Secondary | ICD-10-CM | POA: Insufficient documentation

## 2016-08-14 DIAGNOSIS — I429 Cardiomyopathy, unspecified: Secondary | ICD-10-CM | POA: Insufficient documentation

## 2016-08-14 DIAGNOSIS — I10 Essential (primary) hypertension: Secondary | ICD-10-CM

## 2016-08-14 DIAGNOSIS — Z8673 Personal history of transient ischemic attack (TIA), and cerebral infarction without residual deficits: Secondary | ICD-10-CM | POA: Diagnosis not present

## 2016-08-14 LAB — BASIC METABOLIC PANEL
Anion gap: 10 (ref 5–15)
BUN: 10 mg/dL (ref 6–20)
CO2: 27 mmol/L (ref 22–32)
Calcium: 8.8 mg/dL — ABNORMAL LOW (ref 8.9–10.3)
Chloride: 101 mmol/L (ref 101–111)
Creatinine, Ser: 1 mg/dL (ref 0.61–1.24)
Glucose, Bld: 447 mg/dL — ABNORMAL HIGH (ref 65–99)
POTASSIUM: 3.4 mmol/L — AB (ref 3.5–5.1)
SODIUM: 138 mmol/L (ref 135–145)

## 2016-08-14 NOTE — Progress Notes (Signed)
Patient ID: Mitchell [name], male   DOB: 07/28/1971, 46 y.o.   MRN: [id number]  PCP: None  [facility] Health: [facility]  Pulmonologist: dr Shelle Iron  HPI:  [name] is a 46 year old male with multiple medical problems including obesity, uncontrolled hypertension, uncontrolled diabetes, obstructive sleep apnea, bipolar disease/schizophrenia, CVA, nonobstructive coronary disease and systolic congestive heart failure secondary to nonischemic cardiomyopathy.He is S/P multiple tooth extractions 01/27/14.   Admitted to [facility] 6/10 through 01/09/14 with uncontrolled diabetes. Started back on insulin. He was also referred to Dr Kristin Bruins for teeth extractions. Bidil stopped due to suspected syncope. Discharge weight 276 pounds.   Admitted to [facility] 05/2016 with volume overload and possible pneumonia. Treated with antibiotics. He had been off HF meds for a few weeks. Diuresed with IV lasix and HF meds restarted. Discharge weight was 276 pounds.   He returns HF follow up. Last visit entresto was increased to 49-51 mg twice a day. Says he just took HF medications right before the visit.  Overall feeling ok. Denies SOB/PND/Orthopnea. No CP. SOB with steps. Weight at home 287 pounds. Completed sleep study January 12th.[name] Kitchen He is not exercising.   09/23/11: 2D echo checked with reduction in LVEF to 15-20% with mild to moderate mitral valve regurgitation ECHO 10/30/12 EF 20-25%  ECHO 11/22/13 EF 20-25%  ECHO 05/2016: EF 20-25%.   Labs 01/09/14 Pro BNP 235  Labs 01/09/14 K 3.6 Creatinine 0.73  Labvs 01/28/14 K 4.2 Creatinine 0.89 Labs 03/25/14 K 3.3 Creatinine 0.99 Labs 05/2016: K 3.5 Creatinine.95  Labs 06/19/2016: K 4.1 Creatinine 0.86  Labs 07/13/2016: K 4.8 Creatinine 1.02   SH: Lives at home with wife and children. Does not smoke or drink alcohol. FH: Mom had heart diease  ROS: As per HPI otherwise negative  Past Medical History:  Diagnosis Date  . Bipolar disorder (HCC)   . CHF (congestive heart failure) (HCC)   .  Chronic systolic heart failure (HCC) 11/2010   a. NICM 4/14 EF 20-25%, TR mild  . CVA (cerebral infarction)    No residual deficits  . Depression    PTSD,   . Diabetes mellitus    TYPE II; UNCONTROLLED BY HEMOGLOBIN A1c; STABLE AS  PER DISCHARGE  . Headache(784.0)   . 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 History of colonic polyps   . Hyperlipidemia   . Hypertension   . Myocardial infarction    at age 43 years old (while playing football)  . Non-ischemic cardiomyopathy (HCC)    No ischemia on myoview, showed EF of 42%. 2D echo showed EF 50-55% with diatolic dysfunction in 2009  . Obesity   . OSA (obstructive sleep apnea)    Being set up again for c-pap  . Pneumonia   . Post-cardiac injury syndrome (HCC)    History of cardiac injury from blunt trauma  . PVCs (premature ventricular contractions)   . Schizophrenia (HCC)    Goes to [facility]  . SOB (shortness of breath)   . Stroke [facility]) 2005   some left side weakness  . Syncope    Recurrent, thought to be vasovagal. Also has h/o frequent PVCs.     Current Outpatient Prescriptions  Medication Sig Dispense Refill  . acetaminophen (TYLENOL) 500 MG tablet Take 1 tablet (500 mg total) by mouth every 6 (six) hours as needed. 30 tablet 0  . atorvastatin (LIPITOR) 40 MG tablet Take 1 tablet (40 mg total) by mouth at bedtime. 30 tablet 2  . carvedilol (COREG)  6.25 MG tablet Take 1 tablet (6.25 mg total) by mouth 2 (two) times daily with a meal. 60 tablet 3  . digoxin (LANOXIN) 0.125 MG tablet Take 1 tablet (0.125 mg total) by mouth daily. 30 tablet 2  . furosemide (LASIX) 40 MG tablet Take 1.5 tablets (60 mg total) by mouth daily. 60 tablet 2  . insulin NPH-regular Human (NOVOLIN 70/30) (70-30) 100 UNIT/ML injection Inject 45 Units into the skin 2 (two) times daily.    [name] Kitchen losartan (COZAAR) 25 MG tablet Take 1 tablet (25 mg total) by mouth at bedtime. 30 tablet 0  . QUEtiapine (SEROQUEL) 100 MG tablet Take 1.5 tablets (150  mg total) by mouth at bedtime. 30 tablet 0  . sacubitril-valsartan (ENTRESTO) 49-51 MG Take 1 tablet by mouth 2 (two) times daily. 60 tablet 3   No current facility-administered medications for this encounter.     Allergies  Allergen Reactions  . Nsaids Anaphylaxis    PHYSICAL EXAM: Vitals:   08/14/16 1438  BP: (!) 172/98  BP Location: Right Arm  Patient Position: Sitting  Cuff Size: Large  Pulse: 79  SpO2: 97%  Weight: 290 lb 12.8 oz (131.9 kg)   Filed Weights   08/14/16 1438  Weight: 290 lb 12.8 oz (131.9 kg)   General:  Well appearing. NAD. Walked in the clinic.   HEENT: normal. No teeth.  Neck: supple. JVP 5-6.  Carotids 2+ bilat; no bruits. No lymphadenopathy or thryomegaly appreciated.  Cor: PMI nondisplaced. Regular rhythm. No rubs, or murmurs. No S3 Lungs: CTAB Abdomen: Obese, soft, nontender, distended. No hepatosplenomegaly. No bruits or masses. Good bowel sounds. Extremities: warm, no cyanosis, clubbing, rash, no edema Neuro: alert & oriented x 3, cranial nerves grossly intact. moves all 4 extremities w/o difficulty. Affect pleasant.  ASSESSMENT & PLAN: 1. H/O Pneumonia- 05/2016. Completed antibiotic course.  2.Chronic Systolic Heart Failure - NICM LHC 2012 Non Obstructive CAD. In the past he refused ICD. Most recent ECHO 05/2016 EF 20-25%.  NYHA II. Optimize HF meds for the next few months then repeat ECHO. Likely repeat around March. If EF remains low can consider ICD referral. Volume status stable. Continue lasix 60 mg daily. Continue carvedilol to 6.25 mg twice a day. Continue digoxin 0.125 mg daily. Dig level <0.2 on 06/11/2016 Continue entresto 49-51 mg twice a day.  Stop losartan. He should not be on both.  Hold off on bidil due to compliance.  Hold off on spiro with ongoing noncompliance. Can consider spiro not month. BMET today.    3. DMII- Uncontrolled- Hgb A1C 06/12/2016 12.1 %. Asked him to follow up with PCP4. RLE Diabetic Foot Ulcer- [facility]ed  [facility] 5. OSA- He had sleep study and he is waiting results from Dr Mayford Knife.   6. Noncompliance- Reinforced medication compliance.  7. HTN: Elevated but just took medications. He will follow up next week to reassess. Goal for SVP <130   Follow up next week with APP then 3 weeks with pharmacy. BMET today. Consider spiro next week renal function ok.    Arlander Gillen NP-C 2:46 PM

## 2016-08-14 NOTE — Patient Instructions (Signed)
Labs today (will call for abnormal results, otherwise no news is good news)  STOP taking losartan  Follow up with PharmD in 3 weeks  Follow up in 1 week

## 2016-08-15 NOTE — Procedures (Signed)
   Patient Name: Mitchell [name], Mitchell [name] Date: 08/11/2016 Gender: Male D.O.B: [birth year] Age (years): 46 Referring Provider: Tonye Becket NP Height (inches): 72 Interpreting Physician: Armanda Magic MD, ABSM Weight (lbs): 289 RPSGT: Wylie Hail BMI: 39 MRN: [id number] Neck Size: 19.50  CLINICAL INFORMATION Sleep Study Type: NPSG  Indication for sleep study: OSA  Epworth Sleepiness Score: 10  SLEEP STUDY TECHNIQUE As per the AASM Manual for the Scoring of Sleep and Associated Events v2.3 (April 2016) with a hypopnea requiring 4% desaturations.  The channels recorded and monitored were frontal, central and occipital EEG, electrooculogram (EOG), submentalis EMG (chin), nasal and oral airflow, thoracic and abdominal wall motion, anterior tibialis EMG, snore microphone, electrocardiogram, and pulse oximetry.  MEDICATIONS Medications self-administered by patient taken the night of the study : N/A  SLEEP ARCHITECTURE The study was initiated at 10:12:39 PM and ended at 4:23:37 AM.  Sleep onset time was 4.2 minutes and the sleep efficiency was 95.1%. The total sleep time was 352.8 minutes.  Stage REM latency was 55.5 minutes.  The patient spent 11.70% of the night in stage N1 sleep, 65.90% in stage N2 sleep, 0.00% in stage N3 and 22.39% in REM.  Alpha intrusion was absent.  Supine sleep was 15.31%.  RESPIRATORY PARAMETERS The overall apnea/hypopnea index (AHI) was 2.7 per hour. There were 9 total apneas, including 9 obstructive, 0 central and 0 mixed apneas. There were 7 hypopneas and 9 RERAs.  The AHI during Stage REM sleep was 6.8 per hour.  AHI while supine was 1.1 per hour.  The mean oxygen saturation was 95.24%. The minimum SpO2 during sleep was 80.00%.  Moderate snoring was noted during this study.  CARDIAC DATA The 2 lead EKG demonstrated sinus rhythm. The mean heart rate was 71.65 beats per minute. Other EKG findings include: PVCs.  LEG MOVEMENT DATA The total  PLMS were 0 with a resulting PLMS index of 0.00. Associated arousal with leg movement index was 0.0 .  IMPRESSIONS - No significant obstructive sleep apnea occurred during this study (AHI = 2.7/h). - No significant central sleep apnea occurred during this study (CAI = 0.0/h). - Moderate oxygen desaturation was noted during this study (Min O2 = 80.00%). - The patient snored with Moderate snoring volume. - EKG findings include PVCs. - Clinically significant periodic limb movements did not occur during sleep. No significant associated arousals.  DIAGNOSIS - Normal study  RECOMMENDATIONS - Avoid alcohol, sedatives and other CNS depressants that may worsen sleep apnea and disrupt normal sleep architecture. - Sleep hygiene should be reviewed to assess factors that may improve sleep quality. - Weight management and regular exercise should be initiated or continued if appropriate.  Armanda Magic Diplomate, American Board of Sleep Medicine  ELECTRONICALLY SIGNED ON:  08/15/2016, 7:51 PM [facility] PH: [phone]   FX: [phone] ACCREDITED BY THE AMERICAN ACADEMY OF SLEEP MEDICINE

## 2016-08-18 ENCOUNTER — Telehealth (HOSPITAL_COMMUNITY): Payer: Self-pay | Admitting: *Deleted

## 2016-08-18 ENCOUNTER — Telehealth: Payer: Self-pay | Admitting: *Deleted

## 2016-08-18 MED ORDER — POTASSIUM CHLORIDE ER 20 MEQ PO TBCR: 20.0000 meq | EXTENDED_RELEASE_TABLET | Freq: Every day | ORAL | 3 refills | Status: DC

## 2016-08-18 NOTE — Telephone Encounter (Signed)
Called the patient and gave him his results, he verbalized understanding and agreed 

## 2016-08-18 NOTE — Telephone Encounter (Signed)
-----   Message from Quintella Reichert, MD sent at 08/15/2016  7:53 PM EST ----- Please let patient know that sleep study showed no significant sleep apnea.

## 2016-08-18 NOTE — Telephone Encounter (Signed)
Notes Recorded by Suezanne Cheshire, RN on 08/18/2016 at 12:22 PM EST Called and spoke with Mitchell [name] regarding potassium. He is agreeable and will pickup prescription today. Also discussed he glucose and he verbalized that he is seeing a new PCP this coming Monday and will address his elevated glucose so they can adjust his insulin as needed. Potassium order placed in Epic. ------  Notes Recorded by Suezanne Cheshire, RN on 08/14/2016 at 4:35 PM EST Called and left message to call us back. ------  Notes Recorded by Sherald Hess, NP on 08/14/2016 at 4:19 PM EST Please call needs follow up with PCP regarding elevated glucose. Start 20 meq potassium daily.

## 2016-08-21 ENCOUNTER — Encounter (HOSPITAL_COMMUNITY): Payer: No Typology Code available for payment source

## 2016-08-23 ENCOUNTER — Emergency Department (HOSPITAL_BASED_OUTPATIENT_CLINIC_OR_DEPARTMENT_OTHER)
Admission: EM | Admit: 2016-08-23 | Discharge: 2016-08-24 | Disposition: A | Discharge status: Home / Self Care | Payer: No Typology Code available for payment source | Attending: Emergency Medicine | Admitting: Emergency Medicine

## 2016-08-23 ENCOUNTER — Emergency Department (HOSPITAL_BASED_OUTPATIENT_CLINIC_OR_DEPARTMENT_OTHER): Payer: No Typology Code available for payment source

## 2016-08-23 ENCOUNTER — Encounter (HOSPITAL_BASED_OUTPATIENT_CLINIC_OR_DEPARTMENT_OTHER): Payer: Self-pay

## 2016-08-23 DIAGNOSIS — Z79899 Other long term (current) drug therapy: Secondary | ICD-10-CM | POA: Insufficient documentation

## 2016-08-23 DIAGNOSIS — I509 Heart failure, unspecified: Secondary | ICD-10-CM | POA: Insufficient documentation

## 2016-08-23 DIAGNOSIS — R1110 Vomiting, unspecified: Secondary | ICD-10-CM | POA: Insufficient documentation

## 2016-08-23 DIAGNOSIS — R059 Cough, unspecified: Secondary | ICD-10-CM

## 2016-08-23 DIAGNOSIS — Z794 Long term (current) use of insulin: Secondary | ICD-10-CM | POA: Diagnosis not present

## 2016-08-23 DIAGNOSIS — I110 Hypertensive heart disease with heart failure: Secondary | ICD-10-CM | POA: Insufficient documentation

## 2016-08-23 DIAGNOSIS — R05 Cough: Secondary | ICD-10-CM | POA: Diagnosis present

## 2016-08-23 DIAGNOSIS — E1110 Type 2 diabetes mellitus with ketoacidosis without coma: Secondary | ICD-10-CM | POA: Insufficient documentation

## 2016-08-23 LAB — CBC WITH DIFFERENTIAL/PLATELET
BASOS ABS: 0 10*3/uL (ref 0.0–0.1)
Basophils Relative: 0 %
Eosinophils Absolute: 0.1 10*3/uL (ref 0.0–0.7)
Eosinophils Relative: 1 %
HEMATOCRIT: 38.9 % — AB (ref 39.0–52.0)
Hemoglobin: 13.1 g/dL (ref 13.0–17.0)
LYMPHS ABS: 0.8 10*3/uL (ref 0.7–4.0)
LYMPHS PCT: 11 %
MCH: 29.7 pg (ref 26.0–34.0)
MCHC: 33.7 g/dL (ref 30.0–36.0)
MCV: 88.2 fL (ref 78.0–100.0)
MONO ABS: 0.9 10*3/uL (ref 0.1–1.0)
Monocytes Relative: 12 %
NEUTROS ABS: 5.8 10*3/uL (ref 1.7–7.7)
Neutrophils Relative %: 76 %
Platelets: 151 10*3/uL (ref 150–400)
RBC: 4.41 MIL/uL (ref 4.22–5.81)
RDW: 11.8 % (ref 11.5–15.5)
WBC: 7.6 10*3/uL (ref 4.0–10.5)

## 2016-08-23 LAB — COMPREHENSIVE METABOLIC PANEL
ALT: 17 U/L (ref 17–63)
AST: 35 U/L (ref 15–41)
Albumin: 4 g/dL (ref 3.5–5.0)
Alkaline Phosphatase: 76 U/L (ref 38–126)
Anion gap: 11 (ref 5–15)
BILIRUBIN TOTAL: 0.5 mg/dL (ref 0.3–1.2)
BUN: 9 mg/dL (ref 6–20)
CO2: 27 mmol/L (ref 22–32)
CREATININE: 0.99 mg/dL (ref 0.61–1.24)
Calcium: 8.1 mg/dL — ABNORMAL LOW (ref 8.9–10.3)
Chloride: 97 mmol/L — ABNORMAL LOW (ref 101–111)
Glucose, Bld: 230 mg/dL — ABNORMAL HIGH (ref 65–99)
Potassium: 3 mmol/L — ABNORMAL LOW (ref 3.5–5.1)
Sodium: 135 mmol/L (ref 135–145)
Total Protein: 7.7 g/dL (ref 6.5–8.1)

## 2016-08-23 LAB — TROPONIN I: TROPONIN I: 0.03 ng/mL — AB (ref <0.03)

## 2016-08-23 MED ORDER — ACETAMINOPHEN 325 MG PO TABS
650.0000 mg | ORAL_TABLET | Freq: Once | ORAL | Status: AC
  Administered 2016-08-23: 650 mg via ORAL
  Filled 2016-08-23: qty 2

## 2016-08-23 NOTE — ED Notes (Signed)
Placed on cont cardiac monitor, with cont POX and int NBP readings, pt sitting on side of stretcher, states unable to sit back, a lot of non-prod coughing noted at this time

## 2016-08-23 NOTE — ED Provider Notes (Signed)
[facility] Provider Note   CSN: [id number] Arrival date & time: 08/23/16  1834  By signing my name below, I, Linna Darner, attest that this documentation has been prepared under the direction and in the presence of Langston Masker, [place]. Electronically Signed: Linna Darner, Scribe. 08/23/2016. 10:41 PM.  History   Chief Complaint Chief Complaint  Patient presents with  . Cough    The history is provided by the patient. No language interpreter was used.     HPI Comments: Mitchell [name] is a 46 y.o. male with PMHx significant for CHF, DM type II, HTN, HLD, and pneumonia who presents to the Emergency Department complaining of a persistent cough for two days. Pt reports associated chest tightness. He states his cough has gradually worsened and has occasionally been productive today. He reports that after he has had a couple of episodes of post-tussive emesis today. He notes some bilateral leg swelling that is mildly worse than his baseline. Pt is on Lasix 40 mg daily. He denies fever, chills, or any other associated symptoms.  Past Medical History:  Diagnosis Date  . Bipolar disorder (HCC)   . CHF (congestive heart failure) (HCC)   . Chronic systolic heart failure (HCC) 11/2010   a. NICM 4/14 EF 20-25%, TR mild  . CVA (cerebral infarction)    No residual deficits  . Depression    PTSD,   . Diabetes mellitus    TYPE II; UNCONTROLLED BY HEMOGLOBIN A1c; STABLE AS  PER DISCHARGE  . Headache(784.0)   . 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 History of colonic polyps   . Hyperlipidemia   . Hypertension   . Myocardial infarction    at age 61 years old (while playing football)  . Non-ischemic cardiomyopathy (HCC)    No ischemia on myoview, showed EF of 42%. 2D echo showed EF 50-55% with diatolic dysfunction in 2009  . Obesity   . OSA (obstructive sleep apnea)    Being set up again for c-pap  . Pneumonia   . Post-cardiac injury syndrome (HCC)    History of cardiac injury  from blunt trauma  . PVCs (premature ventricular contractions)   . Schizophrenia (HCC)    Goes to [facility]  . SOB (shortness of breath)   . Stroke [facility]) 2005   some left side weakness  . Syncope    Recurrent, thought to be vasovagal. Also has h/o frequent PVCs.     Patient Active Problem List   Diagnosis Date Noted  . Acute combined systolic and diastolic congestive heart failure (HCC)   . Type 2 diabetes mellitus with hyperglycemia (HCC) 12/07/2015  . Heart failure (HCC) 01/27/2014  . Noncompliance 01/22/2014  . Snores 01/22/2014  . DKA, type 2, not at goal [facility]) 01/08/2014  . Syncope 01/07/2014  . Acute respiratory failure with hypoxia (HCC) 11/25/2013  . CHF (congestive heart failure) (HCC) 11/22/2013  . PNA (pneumonia) 11/22/2013  . CAP (community acquired pneumonia) 07/22/2013  . Community acquired bacterial pneumonia 07/22/2013  . Chronic systolic heart failure (HCC) 10/02/2011  . Erectile dysfunction 10/02/2011  . Acute on chronic systolic heart failure (HCC) 09/23/2011  . Obstructive sleep apnea 10/11/2007  . Type II diabetes mellitus, uncontrolled 10/10/2007  . HLD (hyperlipidemia) 10/10/2007  . HYPOKALEMIA 10/10/2007  . OBESITY 10/10/2007  . Essential hypertension 10/10/2007  . PREMATURE VENTRICULAR CONTRACTIONS 10/10/2007  . SYNCOPE 10/10/2007  . COLONIC POLYPS, HX OF 10/10/2007    Past Surgical History:  Procedure Laterality  Date  . CARDIAC CATHETERIZATION  12/19/10   DIFFUSE NONOBSTRUCTIVE CAD; NONISCHEMIC CARDIOMYOPATHY; LEFT VENTRICULAR ANGIOGRAM WAS PERFORMED SECONDARY TO  ELEVATED LEFT VENTRICULAR FILLING PRESSURES  . COLONOSCOPY W/ POLYPECTOMY    . MULTIPLE EXTRACTIONS WITH ALVEOLOPLASTY  01/27/2014   "all my teeth; 4 Quadrants of alveoloplasty  . MULTIPLE EXTRACTIONS WITH ALVEOLOPLASTY N/A 01/27/2014   Procedure: EXTRACTION OF TEETH #'1, 2, 3, 4, 5, 6, 7, 8, 9, 10, 11, 12, 13, 14, 15, 16, 17, 20, 21, 22, 23, 24, 25, 26, 27, 28, 29, 31  and 32 WITH ALVEOLOPLASTY;  Surgeon: Charlynne Pander, DDS;  Location: [facility] OR;  Service: Oral Surgery;  Laterality: N/A;  . ORIF FINGER / THUMB FRACTURE Right        Home Medications    Prior to Admission medications   Medication Sig Start Date End Date Taking? Authorizing Provider  acetaminophen (TYLENOL) 500 MG tablet Take 1 tablet (500 mg total) by mouth every 6 (six) hours as needed. 06/21/15   Cheri Fowler, PA-C  atorvastatin (LIPITOR) 40 MG tablet Take 1 tablet (40 mg total) by mouth at bedtime. 07/13/16   Graciella Freer, PA-C  carvedilol (COREG) 6.25 MG tablet Take 1 tablet (6.25 mg total) by mouth 2 (two) times daily with a meal. 07/03/16   Amy D Clegg, NP  digoxin (LANOXIN) 0.125 MG tablet Take 1 tablet (0.125 mg total) by mouth daily. 07/13/16   Graciella Freer, PA-C  furosemide (LASIX) 40 MG tablet Take 1.5 tablets (60 mg total) by mouth daily. 07/13/16   Graciella Freer, PA-C  insulin NPH-regular Human (NOVOLIN 70/30) (70-30) 100 UNIT/ML injection Inject 45 Units into the skin 2 (two) times daily.    Historical Provider, MD  potassium chloride 20 MEQ TBCR Take 20 mEq by mouth daily. 08/18/16   Amy D Clegg, NP  QUEtiapine (SEROQUEL) 100 MG tablet Take 1.5 tablets (150 mg total) by mouth at bedtime. 12/08/15   Mauricio Annett Gula, MD  sacubitril-valsartan (ENTRESTO) 49-51 MG Take 1 tablet by mouth 2 (two) times daily. 07/03/16   Amy Georgie Chard, NP    Family History Family History  Problem Relation Age of Onset  . Heart disease Mother     MI  . Heart failure Mother   . Diabetes Mother     ALSO IN MOST OF HIS SIBLINGS; 2 UNLCES HAVE ALSO PASSED AWAY FROM DM  . Cardiomyopathy Mother   . Cancer - Ovarian Mother   . Heart disease Father   . Hypertension Father   . Diabetes Father     Social History Social History  Substance Use Topics  . Smoking status: Never Smoker  . Smokeless tobacco: Never Used  . Alcohol use No     Allergies    Nsaids   Review of Systems Review of Systems  Constitutional: Negative for chills and fever.  Respiratory: Positive for cough and chest tightness.   Cardiovascular: Positive for leg swelling.  Gastrointestinal: Positive for vomiting (post-tussive emesis).  All other systems reviewed and are negative.    Physical Exam Updated Vital Signs BP 160/92 (BP Location: Right Arm)   Pulse 100   Temp 100.1 F (37.8 C) (Oral)   Resp 20   Ht 6' (1.829 m)   Wt 291 lb (132 kg)   SpO2 100%   BMI 39.47 kg/m   Physical Exam  Constitutional: He is oriented to person, place, and time. He appears well-developed and well-nourished. No distress.  HENT:  Head: Normocephalic and  atraumatic.  Eyes: Conjunctivae and EOM are normal.  Neck: Neck supple. No tracheal deviation present.  Cardiovascular: Normal rate.   Pulmonary/Chest: Effort normal. No respiratory distress. He has decreased breath sounds.  Decreased breath sound in all lungs. Hacking cough.  Musculoskeletal: Normal range of motion. He exhibits edema.  Small amount of edema in the lower extremities.  Neurological: He is alert and oriented to person, place, and time.  Skin: Skin is warm and dry.  Psychiatric: He has a normal mood and affect. His behavior is normal.  Nursing note and vitals reviewed.    ED Treatments / Results  Labs (all labs ordered are listed, but only abnormal results are displayed) Labs Reviewed - No data to display  EKG  EKG Interpretation None       Radiology Dg Chest 2 View  Result Date: 08/23/2016 CLINICAL DATA:  Acute onset of fever, shortness of breath and cough. Chest congestion. Vomiting. Initial encounter. EXAM: CHEST  2 VIEW COMPARISON:  Chest radiograph performed 06/12/2016, and CTA of the chest performed 06/11/2016 FINDINGS: The lungs are well-aerated. Vascular congestion is noted. Increased interstitial markings raise concern for minimal interstitial edema. There is no evidence of pleural  effusion or pneumothorax. The heart is mildly enlarged. No acute osseous abnormalities are seen. IMPRESSION: Vascular congestion and mild cardiomegaly. Increased interstitial markings raise concern for minimal interstitial edema. Electronically Signed   By: Roanna Raider M.D.   On: 08/23/2016 20:13    Procedures Procedures (including critical care time)  DIAGNOSTIC STUDIES: Oxygen Saturation is 100% on RA, normal by my interpretation.    COORDINATION OF CARE: 10:46 PM Discussed treatment plan with pt at bedside and pt agreed to plan.  Medications Ordered in ED Medications  acetaminophen (TYLENOL) tablet 650 mg (650 mg Oral Given 08/23/16 1906)     Initial Impression / Assessment and Plan / ED Course  I have reviewed the triage vital signs and the nursing notes.  Pertinent labs & imaging results that were available during my care of the patient were reviewed by me and considered in my medical decision making (see chart for details).     Pt given albuterol and lasix.  Pt urinated 300 cc.  Pt complains of continued shortness of breath.  I will given pt more lasix.  Pt has persistant cough.  I will try tussionex to see if this help.   Troponin is 0/03.  I will repeat,  Final Clinical Impressions(s) / ED Diagnoses   Final diagnoses:  Acute on chronic congestive heart failure, unspecified congestive heart failure type (HCC)  Cough    New Prescriptions New Prescriptions   No medications on file   Meds ordered this encounter  Medications  . acetaminophen (TYLENOL) tablet 650 mg  . furosemide (LASIX) injection 40 mg  . ipratropium-albuterol (DUONEB) 0.5-2.5 (3) MG/3ML nebulizer solution 3 mL  . furosemide (LASIX) injection 40 mg  . chlorpheniramine-HYDROcodone (TUSSIONEX) 10-8 MG/5ML suspension 5 mL  An After Visit Summary was printed and given to the patient.   Lonia Sk[facility], PA-C 08/24/16 0354    Loren Racer, MD 08/25/16 [phone]

## 2016-08-23 NOTE — ED Triage Notes (Signed)
C/o prod cough x 2 days

## 2016-08-23 NOTE — ED Notes (Signed)
ED Provider at bedside. 

## 2016-08-24 ENCOUNTER — Encounter (HOSPITAL_BASED_OUTPATIENT_CLINIC_OR_DEPARTMENT_OTHER): Payer: No Typology Code available for payment source | Attending: Internal Medicine

## 2016-08-24 ENCOUNTER — Encounter (HOSPITAL_BASED_OUTPATIENT_CLINIC_OR_DEPARTMENT_OTHER): Payer: Self-pay

## 2016-08-24 DIAGNOSIS — G4730 Sleep apnea, unspecified: Secondary | ICD-10-CM | POA: Insufficient documentation

## 2016-08-24 DIAGNOSIS — E1165 Type 2 diabetes mellitus with hyperglycemia: Secondary | ICD-10-CM | POA: Insufficient documentation

## 2016-08-24 DIAGNOSIS — I509 Heart failure, unspecified: Secondary | ICD-10-CM | POA: Diagnosis not present

## 2016-08-24 DIAGNOSIS — I110 Hypertensive heart disease with heart failure: Secondary | ICD-10-CM | POA: Diagnosis not present

## 2016-08-24 DIAGNOSIS — E11621 Type 2 diabetes mellitus with foot ulcer: Secondary | ICD-10-CM | POA: Insufficient documentation

## 2016-08-24 DIAGNOSIS — L97511 Non-pressure chronic ulcer of other part of right foot limited to breakdown of skin: Secondary | ICD-10-CM | POA: Diagnosis not present

## 2016-08-24 DIAGNOSIS — I252 Old myocardial infarction: Secondary | ICD-10-CM | POA: Diagnosis not present

## 2016-08-24 DIAGNOSIS — Z8673 Personal history of transient ischemic attack (TIA), and cerebral infarction without residual deficits: Secondary | ICD-10-CM | POA: Diagnosis not present

## 2016-08-24 LAB — TROPONIN I: Troponin I: 0.03 ng/mL (ref <0.03)

## 2016-08-24 MED ORDER — FUROSEMIDE 10 MG/ML IJ SOLN
40.0000 mg | Freq: Once | INTRAMUSCULAR | Status: AC
  Administered 2016-08-24: 40 mg via INTRAVENOUS
  Filled 2016-08-24: qty 4

## 2016-08-24 MED ORDER — IPRATROPIUM-ALBUTEROL 0.5-2.5 (3) MG/3ML IN SOLN
3.0000 mL | Freq: Four times a day (QID) | RESPIRATORY_TRACT | Status: DC
  Administered 2016-08-24: 3 mL via RESPIRATORY_TRACT
  Filled 2016-08-24: qty 3

## 2016-08-24 MED ORDER — HYDROCOD POLST-CPM POLST ER 10-8 MG/5ML PO SUER
5.0000 mL | Freq: Once | ORAL | Status: AC
  Administered 2016-08-24: 5 mL via ORAL
  Filled 2016-08-24: qty 5

## 2016-08-24 NOTE — Discharge Instructions (Signed)
See your Physician for recheck.  Return if any problems.  °

## 2016-09-01 ENCOUNTER — Encounter (HOSPITAL_BASED_OUTPATIENT_CLINIC_OR_DEPARTMENT_OTHER): Payer: No Typology Code available for payment source | Attending: Internal Medicine

## 2016-09-01 DIAGNOSIS — I252 Old myocardial infarction: Secondary | ICD-10-CM | POA: Diagnosis not present

## 2016-09-01 DIAGNOSIS — I10 Essential (primary) hypertension: Secondary | ICD-10-CM | POA: Diagnosis not present

## 2016-09-01 DIAGNOSIS — E11621 Type 2 diabetes mellitus with foot ulcer: Secondary | ICD-10-CM | POA: Diagnosis not present

## 2016-09-01 DIAGNOSIS — G4730 Sleep apnea, unspecified: Secondary | ICD-10-CM | POA: Diagnosis not present

## 2016-09-01 DIAGNOSIS — Z794 Long term (current) use of insulin: Secondary | ICD-10-CM | POA: Insufficient documentation

## 2016-09-01 DIAGNOSIS — L97511 Non-pressure chronic ulcer of other part of right foot limited to breakdown of skin: Secondary | ICD-10-CM | POA: Insufficient documentation

## 2016-09-04 ENCOUNTER — Ambulatory Visit (HOSPITAL_COMMUNITY): Payer: No Typology Code available for payment source

## 2016-09-08 DIAGNOSIS — E11621 Type 2 diabetes mellitus with foot ulcer: Secondary | ICD-10-CM | POA: Diagnosis not present

## 2016-09-15 DIAGNOSIS — E11621 Type 2 diabetes mellitus with foot ulcer: Secondary | ICD-10-CM | POA: Diagnosis not present

## 2016-09-29 ENCOUNTER — Telehealth: Payer: Self-pay | Admitting: Behavioral Health

## 2016-09-29 ENCOUNTER — Encounter: Payer: Self-pay | Admitting: Behavioral Health

## 2016-09-29 NOTE — Telephone Encounter (Signed)
Pre-Visit Call completed with patient and chart updated.   Pre-Visit Info documented in Specialty Comments under Snap[employer]ot.    

## 2016-10-02 ENCOUNTER — Ambulatory Visit (INDEPENDENT_AMBULATORY_CARE_PROVIDER_SITE_OTHER): Payer: No Typology Code available for payment source | Admitting: Family Medicine

## 2016-10-02 ENCOUNTER — Encounter: Payer: Self-pay | Admitting: Family Medicine

## 2016-10-02 ENCOUNTER — Ambulatory Visit (HOSPITAL_BASED_OUTPATIENT_CLINIC_OR_DEPARTMENT_OTHER): Payer: No Typology Code available for payment source

## 2016-10-02 VITALS — BP 124/84 | HR 85 | Temp 98.4°F | Ht 72.0 in | Wt 289.8 lb

## 2016-10-02 DIAGNOSIS — E669 Obesity, unspecified: Secondary | ICD-10-CM

## 2016-10-02 DIAGNOSIS — I10 Essential (primary) hypertension: Secondary | ICD-10-CM

## 2016-10-02 DIAGNOSIS — Z794 Long term (current) use of insulin: Secondary | ICD-10-CM

## 2016-10-02 DIAGNOSIS — E1169 Type 2 diabetes mellitus with other specified complication: Secondary | ICD-10-CM | POA: Diagnosis not present

## 2016-10-02 DIAGNOSIS — E049 Nontoxic goiter, unspecified: Secondary | ICD-10-CM

## 2016-10-02 DIAGNOSIS — E1165 Type 2 diabetes mellitus with hyperglycemia: Secondary | ICD-10-CM

## 2016-10-02 DIAGNOSIS — I5022 Chronic systolic (congestive) heart failure: Secondary | ICD-10-CM | POA: Diagnosis not present

## 2016-10-02 DIAGNOSIS — IMO0002 Reserved for concepts with insufficient information to code with codable children: Secondary | ICD-10-CM

## 2016-10-02 LAB — HEMOGLOBIN A1C: HEMOGLOBIN A1C: 10.3 % — AB (ref 4.6–6.5)

## 2016-10-02 LAB — TSH: TSH: 0.79 u[IU]/mL (ref 0.35–4.50)

## 2016-10-02 MED ORDER — CARVEDILOL 6.25 MG PO TABS: 6.2500 mg | ORAL_TABLET | Freq: Two times a day (BID) | ORAL | 3 refills | Status: DC

## 2016-10-02 MED ORDER — INSULIN NPH ISOPHANE & REGULAR (70-30) 100 UNIT/ML ~~LOC~~ SUSP: 45.0000 [IU] | Freq: Two times a day (BID) | SUBCUTANEOUS | 5 refills | Status: DC

## 2016-10-02 MED ORDER — POTASSIUM CHLORIDE ER 20 MEQ PO TBCR: 20.0000 meq | EXTENDED_RELEASE_TABLET | Freq: Every day | ORAL | 3 refills | Status: DC

## 2016-10-02 MED ORDER — GLUCOSE BLOOD VI STRP: ORAL_STRIP | 12 refills | Status: DC

## 2016-10-02 MED ORDER — DIGOXIN 125 MCG PO TABS: 0.1250 mg | ORAL_TABLET | Freq: Every day | ORAL | 3 refills | Status: DC

## 2016-10-02 MED ORDER — FREESTYLE LANCETS MISC: 12 refills | Status: DC

## 2016-10-02 MED ORDER — FUROSEMIDE 40 MG PO TABS: 60.0000 mg | ORAL_TABLET | Freq: Every day | ORAL | 3 refills | Status: DC

## 2016-10-02 NOTE — Progress Notes (Addendum)
[facility] at [facility] [address] [place], [place] [address] [phone]  Date:  10/02/2016   Name:  Mitchell [name]   DOB:  [birth year]   MRN:  [id number]  PCP:  Abbe Amsterdam, MD    Chief Complaint: Establish Care (Pt here to est care. Will need refills on meds. )   History of Present Illness:  Mitchell [name] is a 45 y.o. very pleasant male patient who presents with the following:  Here to establish care today with this provider.  His main concern at this time is his DM as no one is really managing this at this time   Followed by Cardiology.  Last seen on 08-14-2016 by Tonye Becket, NP.  He has history of obesity, uncontrolled HTN, DM,mental illness, CVA, non-obstructive CAD/ systolic CHF due to nonischemic cardiomyopathy  His most recent admission was in November for volume overload/ ? Pneumonia  He is on Entresto, lasix, dig, losartan, coreg Insulin seroquel lipitor  He reports a history of right foot diabetic ulcer that has been treated by wound care and per pt is now resolved.  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 He is on 70/ 30 right now, and reports that he is taking 45 units BID.  He has not been checking his glucose as he does NOT currently have a meter.   Most recent A1c well above goal He reports a strong family history of DM He has had DM for nearly 20 years per his estimation He has never seen endocrinology Lab Results  Component Value Date   HGBA1C 12.1 (H) 06/12/2016    Declines a flu shot today We gave  him a freestyle lyte meter and went over how to use it today- his glucose tested at 181. Pt expressed that he understands how to use the meter   He is a non- smoker, reports no alcohol or drugs   He has been bothered by ED which is thought due in part to his diabetes.  He is thus eager to improve his DM control  He has also recently joined [employer] and hopes to get into better shape and lose some weight  Last sleep study on 08-11-2016. Impressions/Recommendations from this study: IMPRESSIONS - No significant obstructive sleep apnea occurred during this study (AHI = 2.7/h). - No significant central sleep apnea occurred during this study (CAI = 0.0/h). - Moderate oxygen desaturation was noted during this study (Min O2 = 80.00%). - The patient snored with Moderate snoring volume. - EKG findings include PVCs. - Clinically significant periodic limb movements did not occur during sleep. No significant associated arousals. DIAGNOSIS - Normal study RECOMMENDATIONS - Avoid alcohol, sedatives and other CNS depressants that may worsen sleep apnea and disrupt normal sleep architecture. - Sleep hygiene should be reviewed to assess factors that may improve sleep quality. - Weight management and regular exercise should be initiated or continued if appropriate.    Patient Active  Problem List   Diagnosis Date Noted  . Acute combined systolic and diastolic congestive heart failure (HCC)   . Type 2 diabetes mellitus with hyperglycemia (HCC) 12/07/2015  . Heart failure (HCC) 01/27/2014  . Noncompliance 01/22/2014  . Snores 01/22/2014  . DKA, type 2, not at goal [facility]) 01/08/2014  . Syncope 01/07/2014  . Acute respiratory failure with hypoxia (HCC) 11/25/2013  . CHF (congestive heart failure) (HCC) 11/22/2013  . PNA (pneumonia) 11/22/2013  . CAP (community acquired pneumonia) 07/22/2013  . Community acquired bacterial pneumonia 07/22/2013  . Chronic systolic heart failure (HCC) 10/02/2011  .  Erectile dysfunction 10/02/2011  . Acute on chronic systolic heart failure (HCC) 09/23/2011  . Obstructive sleep apnea 10/11/2007  . Type II diabetes mellitus, uncontrolled 10/10/2007  . HLD (hyperlipidemia) 10/10/2007  . HYPOKALEMIA 10/10/2007  . OBESITY 10/10/2007  . Essential hypertension 10/10/2007  . PREMATURE VENTRICULAR CONTRACTIONS 10/10/2007  . SYNCOPE 10/10/2007  . COLONIC POLYPS, HX OF 10/10/2007    Past Medical History:  Diagnosis Date  . Bipolar disorder (HCC)   . CHF (congestive heart failure) (HCC)   . Chronic systolic heart failure (HCC) 11/2010   a. NICM 4/14 EF 20-25%, TR mild  . CVA (cerebral infarction)    No residual deficits  . Depression    PTSD,   . Diabetes mellitus    TYPE II; UNCONTROLLED BY HEMOGLOBIN A1c; STABLE AS  PER DISCHARGE  . Headache(784.0)   . 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 History of colonic polyps   . Hyperlipidemia   . Hypertension   . Myocardial infarction    at age 23 years old (while playing football)  . Non-ischemic cardiomyopathy (HCC)    No ischemia on myoview, showed EF of 42%. 2D echo showed EF 50-55% with diatolic dysfunction in 2009  . Obesity   . OSA (obstructive sleep apnea)    Being set up again for c-pap  . Pneumonia   . Post-cardiac injury syndrome (HCC)    History of cardiac injury from blunt trauma  . PVCs (premature ventricular contractions)   . Schizophrenia (HCC)    Goes to [facility]  . SOB (shortness of breath)   . Stroke [facility]) 2005   some left side weakness  . Syncope    Recurrent, thought to be vasovagal. Also has h/o frequent PVCs.     Past Surgical History:  Procedure Laterality Date  . CARDIAC CATHETERIZATION  12/19/10   DIFFUSE NONOBSTRUCTIVE CAD; NONISCHEMIC CARDIOMYOPATHY; LEFT VENTRICULAR ANGIOGRAM WAS PERFORMED SECONDARY TO  ELEVATED LEFT VENTRICULAR FILLING PRESSURES  . COLONOSCOPY W/ POLYPECTOMY    . MULTIPLE EXTRACTIONS WITH ALVEOLOPLASTY  01/27/2014   "all my  teeth; 4 Quadrants of alveoloplasty  . MULTIPLE EXTRACTIONS WITH ALVEOLOPLASTY N/A 01/27/2014   Procedure: EXTRACTION OF TEETH #'1, 2, 3, 4, 5, 6, 7, 8, 9, 10, 11, 12, 13, 14, 15, 16, 17, 20, 21, 22, 23, 24, 25, 26, 27, 28, 29, 31 and 32 WITH ALVEOLOPLASTY;  Surgeon: Charlynne Pander, DDS;  Location: [facility] OR;  Service: Oral Surgery;  Laterality: N/A;  . ORIF FINGER / THUMB FRACTURE Right     Social History  Substance Use Topics  . Smoking status: Never Smoker  . Smokeless tobacco: Never Used  . Alcohol use No    Family History  Problem Relation Age of Onset  . Heart disease Mother     MI  . Heart failure Mother   . Diabetes Mother     ALSO  IN MOST OF HIS SIBLINGS; 2 UNLCES HAVE ALSO PASSED AWAY FROM DM  . Cardiomyopathy Mother   . Cancer - Ovarian Mother   . Heart disease Father   . Hypertension Father   . Diabetes Father     Allergies  Allergen Reactions  . Nsaids Anaphylaxis    Medication list has been reviewed and updated.  Current Outpatient Prescriptions on File Prior to Visit  Medication Sig Dispense Refill  . atorvastatin (LIPITOR) 40 MG tablet Take 1 tablet (40 mg total) by mouth at bedtime. 30 tablet 2  . QUEtiapine (SEROQUEL) 100 MG tablet Take 1.5 tablets (150 mg total) by mouth at bedtime. 30 tablet 0  . sacubitril-valsartan (ENTRESTO) 49-51 MG Take 1 tablet by mouth 2 (two) times daily. 60 tablet 3   No current facility-administered medications on file prior to visit.     Review of Systems:  As per HPI- otherwise negative.   Physical Examination: Vitals:   10/02/16 0931  BP: 124/84  Pulse: 85  Temp: 98.4 F (36.9 C)   Vitals:   10/02/16 0931  Weight: 289 lb 12.8 oz (131.5 kg)  Height: 6' (1.829 m)   Body mass index is 39.3 kg/m. Ideal Body Weight: Weight in (lb) to have BMI = 25: 183.9  GEN: WDWN, NAD, Non-toxic, A & O x 3, central obesity but otherwise looks well HEENT: Atraumatic, Normocephalic. Neck supple.  No LAD.  Bilateral TM wnl,  oropharynx normal.  PEERL,EOMI.    His neck is full- ?goiter.  No masses, no nodules Ears and Nose: No external deformity. CV: RRR, No M/G/R. No JVD. No thrill. No extra heart sounds. PULM: CTA B, no wheezes, crackles, rhonchi. No retractions. No resp. distress. No accessory muscle use. EXTR: pt declined to remove shoes today. States that he will wear his house shoes to next visit as it is hard for him to get his lace up shoes off and on NEURO Normal gait.  PSYCH: Normally interactive. Conversant. Not depressed or anxious appearing.  Calm demeanor.    Assessment and Plan:  Uncontrolled type 2 diabetes mellitus with other specified complication, with long-term current use of insulin (HCC) - Plan: insulin NPH-regular Human (NOVOLIN 70/30) (70-30) 100 UNIT/ML injection, Hemoglobin A1c, Ambulatory referral to diabetic education, glucose blood (FREESTYLE TEST STRIPS) test strip, Lancets (FREESTYLE) lancets, CANCELED: Urine Microalbumin w/creat. ratio  Essential hypertension - Plan: digoxin (LANOXIN) 0.125 MG tablet, Potassium Chloride ER 20 MEQ TBCR, carvedilol (COREG) 6.25 MG tablet, furosemide (LASIX) 40 MG tablet  Chronic systolic CHF (congestive heart failure) (HCC) - Plan: digoxin (LANOXIN) 0.125 MG tablet, Potassium Chloride ER 20 MEQ TBCR, carvedilol (COREG) 6.25 MG tablet, furosemide (LASIX) 40 MG tablet  Goiter - Plan: TSH, US THYROID  Obesity, unspecified classification, unspecified obesity type, unspecified whether serious comorbidity present  Here today as a new patient to this office.  He is managed by cardiology but needs someone to take care of his DM Will start working towards this goal today- referral to diabetes classes, start monitoring glucose, A1c today Will get an [place] for his thyroid He is working on his weight though exercise- encouraged this  BP is under control and weight is stable, no current edema Continue to take medications and follow-up with cardiology as usual His  mood/ mental state seems normal today   Signed Abbe Amsterdam, MD  Received his labs and gave him a call 3/6- his A1c is too high but it is better than in November (12%).   He  will increase his insulin to 47u BID for 2-3 days; as long as no hypoglycemia he will then increase to 49u BID.  He will contact me in about one week with some blood sugar readings.    Results for orders placed or performed in visit on 10/02/16  Hemoglobin A1c  Result Value Ref Range   Hgb A1c MFr Bld 10.3 (H) 4.6 - 6.5 %  TSH  Result Value Ref Range   TSH 0.79 0.35 - 4.50 uIU/mL

## 2016-10-02 NOTE — Patient Instructions (Signed)
It was very nice to meet you today I will be in touch with your lab results asap  Please start checking your blood sugar 2x a day- once the morning before you eat and once any other time during the day Please record the time of day you checked and the number   I am going to set you up for diabetes education classes, and we may need you to see a diabetes specialist depending on how things go with your diabetes  I am concerned about your thyroid and am going to arrange an ultrasound of your neck to check on your thyroid.   I refilled your medications as you requested  Take care and please plan to see me in about one month

## 2016-10-03 ENCOUNTER — Encounter: Payer: Self-pay | Admitting: Family Medicine

## 2016-10-03 ENCOUNTER — Ambulatory Visit (HOSPITAL_BASED_OUTPATIENT_CLINIC_OR_DEPARTMENT_OTHER)
Admission: RE | Admit: 2016-10-03 | Discharge: 2016-10-03 | Disposition: A | Discharge status: Home / Self Care | Payer: No Typology Code available for payment source | Source: Ambulatory Visit | Attending: Family Medicine | Admitting: Family Medicine

## 2016-10-03 DIAGNOSIS — E049 Nontoxic goiter, unspecified: Secondary | ICD-10-CM | POA: Insufficient documentation

## 2016-10-04 ENCOUNTER — Encounter: Payer: Self-pay | Admitting: Family Medicine

## 2016-10-12 ENCOUNTER — Ambulatory Visit: Payer: No Typology Code available for payment source | Admitting: Registered"

## 2016-10-16 ENCOUNTER — Other Ambulatory Visit: Payer: Self-pay

## 2016-10-16 DIAGNOSIS — Z794 Long term (current) use of insulin: Principal | ICD-10-CM

## 2016-10-16 DIAGNOSIS — IMO0002 Reserved for concepts with insufficient information to code with codable children: Secondary | ICD-10-CM

## 2016-10-16 DIAGNOSIS — E1165 Type 2 diabetes mellitus with hyperglycemia: Secondary | ICD-10-CM

## 2016-10-16 DIAGNOSIS — E1169 Type 2 diabetes mellitus with other specified complication: Principal | ICD-10-CM

## 2016-10-16 MED ORDER — FREESTYLE LANCETS MISC: 12 refills | Status: DC

## 2016-11-02 ENCOUNTER — Ambulatory Visit (INDEPENDENT_AMBULATORY_CARE_PROVIDER_SITE_OTHER): Payer: No Typology Code available for payment source | Admitting: Family Medicine

## 2016-11-02 ENCOUNTER — Encounter: Payer: Self-pay | Admitting: Family Medicine

## 2016-11-02 VITALS — BP 142/84 | HR 88 | Temp 98.0°F | Wt 287.0 lb

## 2016-11-02 DIAGNOSIS — E876 Hypokalemia: Secondary | ICD-10-CM

## 2016-11-02 DIAGNOSIS — I10 Essential (primary) hypertension: Secondary | ICD-10-CM | POA: Diagnosis not present

## 2016-11-02 DIAGNOSIS — I5022 Chronic systolic (congestive) heart failure: Secondary | ICD-10-CM | POA: Diagnosis not present

## 2016-11-02 DIAGNOSIS — E1165 Type 2 diabetes mellitus with hyperglycemia: Secondary | ICD-10-CM

## 2016-11-02 DIAGNOSIS — Z794 Long term (current) use of insulin: Secondary | ICD-10-CM | POA: Diagnosis not present

## 2016-11-02 DIAGNOSIS — IMO0002 Reserved for concepts with insufficient information to code with codable children: Secondary | ICD-10-CM

## 2016-11-02 DIAGNOSIS — E1169 Type 2 diabetes mellitus with other specified complication: Secondary | ICD-10-CM

## 2016-11-02 LAB — BASIC METABOLIC PANEL
BUN: 11 mg/dL (ref 6–23)
CALCIUM: 8.6 mg/dL (ref 8.4–10.5)
CHLORIDE: 104 meq/L (ref 96–112)
CO2: 30 meq/L (ref 19–32)
Creatinine, Ser: 0.93 mg/dL (ref 0.40–1.50)
GFR: 111.41 mL/min (ref >60.00)
Glucose, Bld: 283 mg/dL — ABNORMAL HIGH (ref 70–99)
Potassium: 3.7 mEq/L (ref 3.5–5.1)
Sodium: 140 mEq/L (ref 135–145)

## 2016-11-02 MED ORDER — ONETOUCH ULTRA SYSTEM W/DEVICE KIT: 1.0000 | PACK | Freq: Once | 0 refills | Status: AC

## 2016-11-02 MED ORDER — GLUCOSE BLOOD VI STRP: ORAL_STRIP | 12 refills | Status: DC

## 2016-11-02 MED ORDER — LANCETS 28G MISC: 12 refills | Status: DC

## 2016-11-02 NOTE — Progress Notes (Signed)
[facility] [address], [address] [place], [place] [address] [phone]  Date:  11/02/2016   Name:  Mitchell [name]   DOB:  May 25, 1971   MRN:  [id number]  PCP:  Abbe Amsterdam, MD    Chief Complaint: Follow-up (Pt here for 1 month follow up. )   History of Present Illness:  Mitchell [name] is a 47 y.o. very pleasant male patient who presents with the following:  Last seen by myself about 1 month ago- see partial HPI from that visit  He has history of obesity, uncontrolled HTN, DM,mental illness, CVA, non-obstructive CAD/ systolic CHF due to nonischemic cardiomyopathy  His most recent admission was in November for volume overload/ ? Pneumonia  He is on Entresto, lasix, dig, losartan, coreg Insulin seroquel lipitor  He reports a history of right foot diabetic ulcer that has been treated by wound care and per pt is now resolved.  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 Hgb A1c MFr Bld 10/02/2016 10.3* 4.6 - 6.5 % Final  . TSH 10/02/2016 0.79  0.35 - 4.50 uIU/mL Final   He has not had any test strips and thus has not been checking his blood sugar at home.  He is using the increased dose of insulin- 49 units.  Tolerating this well He has not noted any sx of hypoglycemia He is down a couple of lbs- great news!   He has not noted any swelling in his legs His breathing is normal/ good No CP or SOB  BP Readings from Last 3 Encounters:  11/02/16 (!)  142/84  10/02/16 124/84  08/24/16 133/65   Wt Readings from Last 3 Encounters:  11/02/16 287 lb (130.2 kg)  10/02/16 289 lb 12.8 oz (131.5 kg)  08/23/16 291 lb (132 kg)     Chemistry      Component Value Date/Time   NA 135 08/23/2016 2300   K 3.0 (L) 08/23/2016 2300   CL 97 (L) 08/23/2016 2300   CO2 27 08/23/2016 2300   BUN 9 08/23/2016 2300   CREATININE 0.99 08/23/2016 2300      Component Value Date/Time   CALCIUM 8.1 (L) 08/23/2016 2300   ALKPHOS 76 08/23/2016 2300   AST 35 08/23/2016 2300   ALT 17 08/23/2016 2300   BILITOT 0.5 08/23/2016 2300       Patient Active Problem List   Diagnosis Date Noted  . Acute combined systolic and diastolic congestive heart failure (HCC)   . Noncompliance 01/22/2014  . Acute respiratory failure with hypoxia (HCC) 11/25/2013  . Community acquired bacterial pneumonia 07/22/2013  . Chronic systolic heart failure (HCC) 10/02/2011  . Erectile dysfunction 10/02/2011  . Acute on chronic systolic heart failure (HCC) 09/23/2011  . Obstructive sleep apnea 10/11/2007  . Type II diabetes mellitus, uncontrolled 10/10/2007  . HLD (hyperlipidemia) 10/10/2007  . HYPOKALEMIA 10/10/2007  .  OBESITY 10/10/2007  . Essential hypertension 10/10/2007  . PREMATURE VENTRICULAR CONTRACTIONS 10/10/2007  . SYNCOPE 10/10/2007  . COLONIC POLYPS, HX OF 10/10/2007    Past Medical History:  Diagnosis Date  . Bipolar disorder (HCC)   . CHF (congestive heart failure) (HCC)   . Chronic systolic heart failure (HCC) 11/2010   a. NICM 4/14 EF 20-25%, TR mild  . CVA (cerebral infarction)    No residual deficits  . Depression    PTSD,   . Diabetes mellitus    TYPE II; UNCONTROLLED BY HEMOGLOBIN A1c; STABLE AS  PER DISCHARGE  . Headache(784.0)   . 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 History of colonic polyps   . Hyperlipidemia   . Hypertension   . Myocardial infarction    at age 41 years old (while playing football)  . Non-ischemic cardiomyopathy (HCC)     No ischemia on myoview, showed EF of 42%. 2D echo showed EF 50-55% with diatolic dysfunction in 2009  . Obesity   . OSA (obstructive sleep apnea)    Being set up again for c-pap  . Pneumonia   . Post-cardiac injury syndrome (HCC)    History of cardiac injury from blunt trauma  . PVCs (premature ventricular contractions)   . Schizophrenia (HCC)    Goes to [facility]  . SOB (shortness of breath)   . Stroke [facility]) 2005   some left side weakness  . Syncope    Recurrent, thought to be vasovagal. Also has h/o frequent PVCs.     Past Surgical History:  Procedure Laterality Date  . CARDIAC CATHETERIZATION  12/19/10   DIFFUSE NONOBSTRUCTIVE CAD; NONISCHEMIC CARDIOMYOPATHY; LEFT VENTRICULAR ANGIOGRAM WAS PERFORMED SECONDARY TO  ELEVATED LEFT VENTRICULAR FILLING PRESSURES  . COLONOSCOPY W/ POLYPECTOMY    . MULTIPLE EXTRACTIONS WITH ALVEOLOPLASTY  01/27/2014   "all my teeth; 4 Quadrants of alveoloplasty  . MULTIPLE EXTRACTIONS WITH ALVEOLOPLASTY N/A 01/27/2014   Procedure: EXTRACTION OF TEETH #'1, 2, 3, 4, 5, 6, 7, 8, 9, 10, 11, 12, 13, 14, 15, 16, 17, 20, 21, 22, 23, 24, 25, 26, 27, 28, 29, 31 and 32 WITH ALVEOLOPLASTY;  Surgeon: Charlynne Pander, DDS;  Location: [facility] OR;  Service: Oral Surgery;  Laterality: N/A;  . ORIF FINGER / THUMB FRACTURE Right     Social History  Substance Use Topics  . Smoking status: Never Smoker  . Smokeless tobacco: Never Used  . Alcohol use No    Family History  Problem Relation Age of Onset  . Heart disease Mother     MI  . Heart failure Mother   . Diabetes Mother     ALSO IN MOST OF HIS SIBLINGS; 2 UNLCES HAVE ALSO PASSED AWAY FROM DM  . Cardiomyopathy Mother   . Cancer - Ovarian Mother   . Heart disease Father   . Hypertension Father   . Diabetes Father     Allergies  Allergen Reactions  . Nsaids Anaphylaxis    Medication list has been reviewed and updated.  Current Outpatient Prescriptions on File Prior to Visit  Medication  Sig Dispense Refill  . atorvastatin (LIPITOR) 40 MG tablet Take 1 tablet (40 mg total) by mouth at bedtime. 30 tablet 2  . carvedilol (COREG) 6.25 MG tablet Take 1 tablet (6.25 mg total) by mouth 2 (two) times daily with a meal. 180 tablet 3  . digoxin (LANOXIN) 0.125 MG tablet Take 1 tablet (0.125 mg total) by mouth daily. 90 tablet 3  .  furosemide (LASIX) 40 MG tablet Take 1.5 tablets (60 mg total) by mouth daily. 180 tablet 3  . glucose blood (FREESTYLE TEST STRIPS) test strip Test glucose 2-3x a day 100 each 12  . insulin NPH-regular Human (NOVOLIN 70/30) (70-30) 100 UNIT/ML injection Inject 45 Units into the skin 2 (two) times daily. 10 mL 5  . Lancets (FREESTYLE) lancets Test glucose 2-3x a day 100 each 12  . Potassium Chloride ER 20 MEQ TBCR Take 20 mEq by mouth daily. 30 tablet 3  . QUEtiapine (SEROQUEL) 100 MG tablet Take 1.5 tablets (150 mg total) by mouth at bedtime. 30 tablet 0  . sacubitril-valsartan (ENTRESTO) 49-51 MG Take 1 tablet by mouth 2 (two) times daily. 60 tablet 3   No current facility-administered medications on file prior to visit.     Review of Systems:  As per HPI- otherwise negative.   Physical Examination: Vitals:   11/02/16 0955 11/02/16 1007  BP: (!) 150/82 (!) 142/84  Pulse: 88   Temp: 98 F (36.7 C)    Vitals:   11/02/16 0955  Weight: 287 lb (130.2 kg)   Body mass index is 38.92 kg/m. Ideal Body Weight:    GEN: WDWN, NAD, Non-toxic, A & O x 3, obese, looks well HEENT: Atraumatic, Normocephalic. Neck supple. No masses, No LAD. Ears and Nose: No external deformity. CV: RRR, No M/G/R. No JVD. No thrill. No extra heart sounds. PULM: CTA B, no wheezes, crackles, rhonchi. No retractions. No resp. distress. No accessory muscle use. ABD: S, NT, ND, +BS. No rebound. No HSM. EXTR: No c/c/e NEURO Normal gait.  PSYCH: Normally interactive. Conversant. Not depressed or anxious appearing.  Calm demeanor.    Assessment and Plan: Essential  hypertension  Chronic systolic CHF (congestive heart failure) (HCC)  Uncontrolled type 2 diabetes mellitus with other specified complication, with long-term current use of insulin (HCC)  Hypokalemia - Plan: Basic metabolic panel here today to check on his chronic medical problems.  My CMA worked very hard to set him up with the type of glucose meter and strips that medicare should cover, so we hope he will now be able to start checking his glucose regularly Asked him to check some readings and contact me in about one week I will also check his potassium today- too early to check A1c BMP today- Will plan further follow- up pending labs.  Weight is stable, no sign of CHF exacerbation BP is under reasonable control Signed Abbe Amsterdam, MD

## 2016-11-02 NOTE — Patient Instructions (Addendum)
We need for you to start checking your blood sugar so I can adjust your insulin. Please check it once or twice a day and keep a log.  Please call me in about one week (or bring in a list of glucose readings) so we can adjust your insulin  We will also check your potassium today- I will be in touch with this result  Use the test strip samples that I gave you for now and we are trying to get your rx approved so you can get more

## 2016-11-27 ENCOUNTER — Encounter (HOSPITAL_BASED_OUTPATIENT_CLINIC_OR_DEPARTMENT_OTHER): Payer: No Typology Code available for payment source | Attending: Internal Medicine

## 2016-12-04 ENCOUNTER — Emergency Department (HOSPITAL_BASED_OUTPATIENT_CLINIC_OR_DEPARTMENT_OTHER)
Admission: EM | Admit: 2016-12-04 | Discharge: 2016-12-04 | Disposition: A | Discharge status: Home / Self Care | Payer: No Typology Code available for payment source | Attending: Emergency Medicine | Admitting: Emergency Medicine

## 2016-12-04 ENCOUNTER — Encounter (HOSPITAL_BASED_OUTPATIENT_CLINIC_OR_DEPARTMENT_OTHER): Payer: Self-pay | Admitting: *Deleted

## 2016-12-04 DIAGNOSIS — Z794 Long term (current) use of insulin: Secondary | ICD-10-CM | POA: Diagnosis not present

## 2016-12-04 DIAGNOSIS — I110 Hypertensive heart disease with heart failure: Secondary | ICD-10-CM | POA: Insufficient documentation

## 2016-12-04 DIAGNOSIS — R51 Headache: Secondary | ICD-10-CM

## 2016-12-04 DIAGNOSIS — I5022 Chronic systolic (congestive) heart failure: Secondary | ICD-10-CM | POA: Diagnosis not present

## 2016-12-04 DIAGNOSIS — J3089 Other allergic rhinitis: Secondary | ICD-10-CM | POA: Diagnosis not present

## 2016-12-04 DIAGNOSIS — E119 Type 2 diabetes mellitus without complications: Secondary | ICD-10-CM | POA: Insufficient documentation

## 2016-12-04 DIAGNOSIS — R519 Headache, unspecified: Secondary | ICD-10-CM

## 2016-12-04 DIAGNOSIS — Z79899 Other long term (current) drug therapy: Secondary | ICD-10-CM | POA: Diagnosis not present

## 2016-12-04 MED ORDER — CETIRIZINE HCL 10 MG PO TABS: 10.0000 mg | ORAL_TABLET | Freq: Every day | ORAL | 1 refills | Status: DC

## 2016-12-04 MED ORDER — FLUTICASONE PROPIONATE 50 MCG/ACT NA SUSP: 2.0000 | Freq: Every day | NASAL | 0 refills | Status: DC

## 2016-12-04 NOTE — ED Triage Notes (Signed)
After mowing the grass yesterday he had a cough and headache. Pain in his head has continued. He thinks it is allergy related.

## 2016-12-04 NOTE — ED Notes (Signed)
Pt verbalizes understanding of d/c instructions and denies any further needs at this time. 

## 2016-12-04 NOTE — ED Provider Notes (Signed)
[facility] Provider Note   CSN: [id number] Arrival date & time: 12/04/16  2100  By signing my name below, I, Mitchell [name], attest that this documentation has been prepared under the direction and in the presence of non-physician practitioner, Everlene Farrier, PA-C. Electronically Signed: Nelwyn [name], Scribe. 12/04/2016. 9:13 PM.  History   Chief Complaint Chief Complaint  Patient presents with  . Headache   The history is provided by the patient. No language interpreter was used.    HPI Comments:  Mitchell [name] is a 46 y.o. male with pmhx of CHF, DM, HTN, HLD and stroke who presents to the Emergency Department complaining of constant, mild, frontal headache onset yesterday. Pt states that he just bought a Journalist, newspaper and was mowing his lawn yesterday when his headache began. He reports associated sneezing, nasal congestion and post nasal drip causing a cough. He reports some chronic deficits from an old stroke, but no new deficits and he denies any stroke like symptoms. No medications tried PTA. Denies any fever, numbness, paraesthesia, weakness, visual disturbance, neck pain, neck stiffness or trouble breathing.   Past Medical History:  Diagnosis Date  . Bipolar disorder (HCC)   . CHF (congestive heart failure) (HCC)   . Chronic systolic heart failure (HCC) 11/2010   a. NICM 4/14 EF 20-25%, TR mild  . CVA (cerebral infarction)    No residual deficits  . Depression    PTSD,   . Diabetes mellitus    TYPE II; UNCONTROLLED BY HEMOGLOBIN A1c; STABLE AS  PER DISCHARGE  . Headache(784.0)   . 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 History of colonic polyps   . Hyperlipidemia   . Hypertension   . Myocardial infarction [facility])    at age 83 years old (while playing football)  . Non-ischemic cardiomyopathy (HCC)    No ischemia on myoview, showed EF of 42%. 2D echo showed EF 50-55% with diatolic dysfunction in 2009  . Obesity   . OSA (obstructive sleep apnea)    Being set  up again for c-pap  . Pneumonia   . Post-cardiac injury syndrome (HCC)    History of cardiac injury from blunt trauma  . PVCs (premature ventricular contractions)   . Schizophrenia (HCC)    Goes to [facility]  . SOB (shortness of breath)   . Stroke [facility]) 2005   some left side weakness  . Syncope    Recurrent, thought to be vasovagal. Also has h/o frequent PVCs.     Patient Active Problem List   Diagnosis Date Noted  . Acute combined systolic and diastolic congestive heart failure (HCC)   . Noncompliance 01/22/2014  . Acute respiratory failure with hypoxia (HCC) 11/25/2013  . Chronic systolic heart failure (HCC) 10/02/2011  . Erectile dysfunction 10/02/2011  . Acute on chronic systolic heart failure (HCC) 09/23/2011  . Obstructive sleep apnea 10/11/2007  . Type II diabetes mellitus, uncontrolled 10/10/2007  . HLD (hyperlipidemia) 10/10/2007  . HYPOKALEMIA 10/10/2007  . OBESITY 10/10/2007  . Essential hypertension 10/10/2007  . PREMATURE VENTRICULAR CONTRACTIONS 10/10/2007  . SYNCOPE 10/10/2007  . COLONIC POLYPS, HX OF 10/10/2007    Past Surgical History:  Procedure Laterality Date  . CARDIAC CATHETERIZATION  12/19/10   DIFFUSE NONOBSTRUCTIVE CAD; NONISCHEMIC CARDIOMYOPATHY; LEFT VENTRICULAR ANGIOGRAM WAS PERFORMED SECONDARY TO  ELEVATED LEFT VENTRICULAR FILLING PRESSURES  . COLONOSCOPY W/ POLYPECTOMY    . MULTIPLE EXTRACTIONS WITH ALVEOLOPLASTY  01/27/2014   "all my teeth; 4 Quadrants of alveoloplasty  .  MULTIPLE EXTRACTIONS WITH ALVEOLOPLASTY N/A 01/27/2014   Procedure: EXTRACTION OF TEETH #'1, 2, 3, 4, 5, 6, 7, 8, 9, 10, 11, 12, 13, 14, 15, 16, 17, 20, 21, 22, 23, 24, 25, 26, 27, 28, 29, 31 and 32 WITH ALVEOLOPLASTY;  Surgeon: Charlynne Pander, DDS;  Location: [facility] OR;  Service: Oral Surgery;  Laterality: N/A;  . ORIF FINGER / THUMB FRACTURE Right        Home Medications    Prior to Admission medications   Medication Sig Start Date End Date Taking?  Authorizing Provider  atorvastatin (LIPITOR) 40 MG tablet Take 1 tablet (40 mg total) by mouth at bedtime. 07/13/16   Graciella Freer, PA-C  carvedilol (COREG) 6.25 MG tablet Take 1 tablet (6.25 mg total) by mouth 2 (two) times daily with a meal. 10/02/16   [name], Gwenlyn Found, MD  cetirizine (ZYRTEC ALLERGY) 10 MG tablet Take 1 tablet (10 mg total) by mouth daily. 12/04/16   Everlene Farrier, PA-C  digoxin (LANOXIN) 0.125 MG tablet Take 1 tablet (0.125 mg total) by mouth daily. 10/02/16   [name], Gwenlyn Found, MD  fluticasone (FLONASE) 50 MCG/ACT nasal spray Place 2 sprays into both nostrils daily. 12/04/16   Everlene Farrier, PA-C  furosemide (LASIX) 40 MG tablet Take 1.5 tablets (60 mg total) by mouth daily. 10/02/16   [name], Gwenlyn Found, MD  glucose blood test strip Pt uses one touch.  Test BID 11/02/16   [name], Gwenlyn Found, MD  insulin NPH-regular Human (NOVOLIN 70/30) (70-30) 100 UNIT/ML injection Inject 45 Units into the skin 2 (two) times daily. 10/02/16   [name], Gwenlyn Found, MD  Lancets 28G MISC Use for glucose testing up to BID 11/02/16   [name], Gwenlyn Found, MD  Potassium Chloride ER 20 MEQ TBCR Take 20 mEq by mouth daily. 10/02/16   [name], Gwenlyn Found, MD  QUEtiapine (SEROQUEL) 100 MG tablet Take 1.5 tablets (150 mg total) by mouth at bedtime. 12/08/15   Arrien, York Ram, MD  sacubitril-valsartan (ENTRESTO) 49-51 MG Take 1 tablet by mouth 2 (two) times daily. 07/03/16   Sherald Hess, NP    Family History Family History  Problem Relation Age of Onset  . Heart disease Mother     MI  . Heart failure Mother   . Diabetes Mother     ALSO IN MOST OF HIS SIBLINGS; 2 UNLCES HAVE ALSO PASSED AWAY FROM DM  . Cardiomyopathy Mother   . Cancer - Ovarian Mother   . Heart disease Father   . Hypertension Father   . Diabetes Father     Social History Social History  Substance Use Topics  . Smoking status: Never Smoker  . Smokeless tobacco: Never Used  . Alcohol use No     Allergies    Nsaids   Review of Systems Review of Systems  Constitutional: Negative for chills and fever.  HENT: Positive for congestion, postnasal drip, rhinorrhea, sinus pressure and sneezing. Negative for ear pain, facial swelling, sore throat and trouble swallowing.   Eyes: Negative for pain and visual disturbance.  Respiratory: Positive for cough. Negative for shortness of breath and wheezing.   Cardiovascular: Negative for chest pain.  Gastrointestinal: Negative for abdominal pain, nausea and vomiting.  Genitourinary: Negative for dysuria.  Musculoskeletal: Negative for back pain, neck pain and neck stiffness.  Skin: Negative for rash.  Neurological: Positive for headaches. Negative for dizziness, syncope, speech difficulty, weakness, light-headedness and numbness.       Negative for Paraesthesia  Physical Exam Updated Vital Signs BP (!) 160/99   Pulse 90   Temp 98.3 F (36.8 C) (Oral)   Resp 18   Ht 6' (1.829 m)   Wt 130.6 kg   SpO2 97%   BMI 39.06 kg/m   Physical Exam  Constitutional: He is oriented to person, place, and time. He appears well-developed and well-nourished. No distress.  Nontoxic appearing  HENT:  Head: Normocephalic and atraumatic.  Right Ear: Tympanic membrane and external ear normal.  Left Ear: Tympanic membrane and external ear normal.  Mouth/Throat: Oropharynx is clear and moist.  Boggy nasal turbinates and rhinorrhea present bilaterally. No temporal edema or tenderness.  Bilateral tympanic membranes are pearly-gray without erythema or loss of landmarks.   Eyes: Conjunctivae and EOM are normal. Pupils are equal, round, and reactive to light. Right eye exhibits no discharge. Left eye exhibits no discharge.  Neck: Normal range of motion. Neck supple. No JVD present. No tracheal deviation present.  Cardiovascular: Normal rate, regular rhythm, normal heart sounds and intact distal pulses.   Pulmonary/Chest: Effort normal and breath sounds normal. No  stridor. No respiratory distress. He has no wheezes. He has no rales.  Lungs are clear to ascultation bilaterally. Symmetric chest expansion bilaterally. No increased work of breathing. No rales or rhonchi.    Abdominal: Soft. There is no tenderness.  Musculoskeletal: Normal range of motion. He exhibits no tenderness or deformity.  Lymphadenopathy:    He has no cervical adenopathy.  Neurological: He is alert and oriented to person, place, and time. No sensory deficit. He exhibits normal muscle tone. Coordination normal.  Patient is alert and oriented 3. Patient has some unequal smile and eyebrow raising on his right side. This is chronic and unchanged according to the patient. He reports is from a previous stroke. Cranial nerves are otherwise intact. He has good strength in his bilateral upper and lower extremities. Normal gait. Good sensation intact his bilateral upper and lower extremities.   Skin: Skin is warm and dry. Capillary refill takes less than 2 seconds. No rash noted. He is not diaphoretic. No erythema. No pallor.  Psychiatric: He has a normal mood and affect. His behavior is normal.  Nursing note and vitals reviewed.    ED Treatments / Results  DIAGNOSTIC STUDIES:  Oxygen Saturation is 97% on RA, normal by my interpretation.    COORDINATION OF CARE:  9:37 PM Discussed treatment plan with pt at bedside which includes nasal spray and antihistamines and pt agreed to plan.  Labs (all labs ordered are listed, but only abnormal results are displayed) Labs Reviewed - No data to display  EKG  EKG Interpretation None       Radiology No results found.  Procedures Procedures (including critical care time)  Medications Ordered in ED Medications - No data to display   Initial Impression / Assessment and Plan / ED Course  I have reviewed the triage vital signs and the nursing notes.  Pertinent labs & imaging results that were available during my care of the patient were  reviewed by me and considered in my medical decision making (see chart for details).    This is a 46 y.o. male with pmhx of CHF, DM, HTN, HLD and stroke who presents to the Emergency Department complaining of constant, mild, frontal headache onset yesterday. Pt states that he just bought a Journalist, newspaper and was mowing his lawn yesterday when his headache began. He reports associated sneezing, nasal congestion and post nasal drip causing  a cough. He reports some chronic deficits from an old stroke, but no new deficits and he denies any stroke like symptoms.  On exam the patient is afebrile and nontoxic appearing.  No new focal neurological deficits on exam. He does have some chronic deficits from an old stroke. He denies any stroke like symptoms. He has rhinorrhea and boggy nasal turbinates on exam. I suspect patient is having sinus headache from being exposed to allergens outside yesterday while on them riding lawnmower. No concern for meningitis, stroke, or subarachnoid hemorrhage. We'll start the patient on Flonase and Zyrtec and have him follow-up with his primary care doctor.  At this time there does not appear to be any evidence of an acute emergency medical condition and the patient appears stable for discharge with appropriate outpatient follow up.Diagnosis was discussed with patient who verbalizes understanding and is agreeable to discharge.   Final Clinical Impressions(s) / ED Diagnoses   Final diagnoses:  Sinus headache  Seasonal allergic rhinitis due to other allergic trigger    New Prescriptions New Prescriptions   CETIRIZINE (ZYRTEC ALLERGY) 10 MG TABLET    Take 1 tablet (10 mg total) by mouth daily.   FLUTICASONE (FLONASE) 50 MCG/ACT NASAL SPRAY    Place 2 sprays into both nostrils daily.    I personally performed the services described in this documentation, which was scribed in my presence. The recorded information has been reviewed and is accurate.      Everlene Farrier,  PA-C 12/04/16 2149    Maia Plan, MD 12/05/16 [phone]

## 2016-12-07 ENCOUNTER — Encounter (HOSPITAL_BASED_OUTPATIENT_CLINIC_OR_DEPARTMENT_OTHER): Payer: No Typology Code available for payment source | Attending: Internal Medicine

## 2016-12-07 ENCOUNTER — Encounter (HOSPITAL_BASED_OUTPATIENT_CLINIC_OR_DEPARTMENT_OTHER): Payer: No Typology Code available for payment source

## 2016-12-07 DIAGNOSIS — E1140 Type 2 diabetes mellitus with diabetic neuropathy, unspecified: Secondary | ICD-10-CM | POA: Insufficient documentation

## 2016-12-07 DIAGNOSIS — I10 Essential (primary) hypertension: Secondary | ICD-10-CM | POA: Insufficient documentation

## 2016-12-07 DIAGNOSIS — I428 Other cardiomyopathies: Secondary | ICD-10-CM | POA: Diagnosis not present

## 2016-12-07 DIAGNOSIS — I252 Old myocardial infarction: Secondary | ICD-10-CM | POA: Insufficient documentation

## 2016-12-07 DIAGNOSIS — E11621 Type 2 diabetes mellitus with foot ulcer: Secondary | ICD-10-CM | POA: Insufficient documentation

## 2016-12-07 DIAGNOSIS — G4730 Sleep apnea, unspecified: Secondary | ICD-10-CM | POA: Diagnosis not present

## 2016-12-07 DIAGNOSIS — L97419 Non-pressure chronic ulcer of right heel and midfoot with unspecified severity: Secondary | ICD-10-CM | POA: Diagnosis not present

## 2016-12-07 DIAGNOSIS — Z8673 Personal history of transient ischemic attack (TIA), and cerebral infarction without residual deficits: Secondary | ICD-10-CM | POA: Diagnosis not present

## 2016-12-07 DIAGNOSIS — L84 Corns and callosities: Secondary | ICD-10-CM | POA: Insufficient documentation

## 2016-12-18 DIAGNOSIS — E11621 Type 2 diabetes mellitus with foot ulcer: Secondary | ICD-10-CM | POA: Diagnosis not present

## 2016-12-26 ENCOUNTER — Emergency Department (HOSPITAL_BASED_OUTPATIENT_CLINIC_OR_DEPARTMENT_OTHER)
Admission: EM | Admit: 2016-12-26 | Discharge: 2016-12-26 | Disposition: A | Discharge status: Home / Self Care | Payer: No Typology Code available for payment source | Attending: Emergency Medicine | Admitting: Emergency Medicine

## 2016-12-26 ENCOUNTER — Encounter (HOSPITAL_BASED_OUTPATIENT_CLINIC_OR_DEPARTMENT_OTHER): Payer: Self-pay

## 2016-12-26 DIAGNOSIS — I5022 Chronic systolic (congestive) heart failure: Secondary | ICD-10-CM | POA: Insufficient documentation

## 2016-12-26 DIAGNOSIS — E119 Type 2 diabetes mellitus without complications: Secondary | ICD-10-CM | POA: Insufficient documentation

## 2016-12-26 DIAGNOSIS — I110 Hypertensive heart disease with heart failure: Secondary | ICD-10-CM | POA: Diagnosis not present

## 2016-12-26 DIAGNOSIS — R0981 Nasal congestion: Secondary | ICD-10-CM | POA: Diagnosis present

## 2016-12-26 DIAGNOSIS — J302 Other seasonal allergic rhinitis: Secondary | ICD-10-CM | POA: Insufficient documentation

## 2016-12-26 DIAGNOSIS — J3089 Other allergic rhinitis: Secondary | ICD-10-CM

## 2016-12-26 DIAGNOSIS — Z794 Long term (current) use of insulin: Secondary | ICD-10-CM | POA: Insufficient documentation

## 2016-12-26 MED ORDER — FLUTICASONE PROPIONATE 50 MCG/ACT NA SUSP: 2.0000 | Freq: Every day | NASAL | 0 refills | Status: DC

## 2016-12-26 MED ORDER — CETIRIZINE HCL 10 MG PO TABS
10.0000 mg | ORAL_TABLET | Freq: Every day | ORAL | 0 refills | Status: DC
Start: 2016-12-26 — End: 2017-08-08

## 2016-12-26 NOTE — Discharge Instructions (Signed)
Takes Flonase and Zyrtec as prescribed for your symptoms. Follow-up with your primary care for reevaluation this week. Return to the ED if any concerning symptoms develop.

## 2016-12-26 NOTE — ED Provider Notes (Signed)
[facility] Provider Note   CSN: [id number] Arrival date & time: 12/26/16  1217     History   Chief Complaint Chief Complaint  Patient presents with  . Nasal Congestion    HPI Mitchell [name] is a 46 y.o. male with past medical history HTN, HLD, DM type II, stroke with residual left-sided weakness who presents a with chief complaint acute onset, progressively improving sneezing, cough, sore throat with associated frontal headache that began this morning. He states that around 7:30 AM this morning he was mowing his grass when his symptoms developed. He states that headache was a 7/10 frontal headache radiating to the occiput and not associated with any neurological symptoms, vision changes, photophobia or phonophobia. He states that with rest headache has now improved to 2/10 dull ache. He states that he went to work where he was asked to be evaluated due to his persistent cough. He has not tried anything for his symptoms. He denies fevers, chills, shortness of breath, chest pain, abdominal pain. He states that he has had symptoms like this before, for which he was evaluated here earlier this month. He was given Flonase and Zyrtec with significant improvement of his symptoms, but states that he is currently out of these medications. He has not had his blood pressure medications today.  The history is provided by the patient.    Past Medical History:  Diagnosis Date  . Bipolar disorder (HCC)   . CHF (congestive heart failure) (HCC)   . Chronic systolic heart failure (HCC) 11/2010   a. NICM 4/14 EF 20-25%, TR mild  . CVA (cerebral infarction)    No residual deficits  . Depression    PTSD,   . Diabetes mellitus    TYPE II; UNCONTROLLED BY HEMOGLOBIN A1c; STABLE AS  PER DISCHARGE  . Headache(784.0)   . 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 History of colonic polyps   . Hyperlipidemia   . Hypertension   . Myocardial infarction [facility])    at age 73 years old (while playing  football)  . Non-ischemic cardiomyopathy (HCC)    No ischemia on myoview, showed EF of 42%. 2D echo showed EF 50-55% with diatolic dysfunction in 2009  . Obesity   . OSA (obstructive sleep apnea)    Being set up again for c-pap  . Pneumonia   . Post-cardiac injury syndrome (HCC)    History of cardiac injury from blunt trauma  . PVCs (premature ventricular contractions)   . Schizophrenia (HCC)    Goes to [facility]  . SOB (shortness of breath)   . Stroke [facility]) 2005   some left side weakness  . Syncope    Recurrent, thought to be vasovagal. Also has h/o frequent PVCs.     Patient Active Problem List   Diagnosis Date Noted  . Acute combined systolic and diastolic congestive heart failure (HCC)   . Noncompliance 01/22/2014  . Acute respiratory failure with hypoxia (HCC) 11/25/2013  . Chronic systolic heart failure (HCC) 10/02/2011  . Erectile dysfunction 10/02/2011  . Acute on chronic systolic heart failure (HCC) 09/23/2011  . Obstructive sleep apnea 10/11/2007  . Type II diabetes mellitus, uncontrolled 10/10/2007  . HLD (hyperlipidemia) 10/10/2007  . HYPOKALEMIA 10/10/2007  . OBESITY 10/10/2007  . Essential hypertension 10/10/2007  . PREMATURE VENTRICULAR CONTRACTIONS 10/10/2007  . SYNCOPE 10/10/2007  . COLONIC POLYPS, HX OF 10/10/2007    Past Surgical History:  Procedure Laterality Date  . CARDIAC CATHETERIZATION  12/19/10  DIFFUSE NONOBSTRUCTIVE CAD; NONISCHEMIC CARDIOMYOPATHY; LEFT VENTRICULAR ANGIOGRAM WAS PERFORMED SECONDARY TO  ELEVATED LEFT VENTRICULAR FILLING PRESSURES  . COLONOSCOPY W/ POLYPECTOMY    . MULTIPLE EXTRACTIONS WITH ALVEOLOPLASTY  01/27/2014   "all my teeth; 4 Quadrants of alveoloplasty  . MULTIPLE EXTRACTIONS WITH ALVEOLOPLASTY N/A 01/27/2014   Procedure: EXTRACTION OF TEETH #'1, 2, 3, 4, 5, 6, 7, 8, 9, 10, 11, 12, 13, 14, 15, 16, 17, 20, 21, 22, 23, 24, 25, 26, 27, 28, 29, 31 and 32 WITH ALVEOLOPLASTY;  Surgeon: Charlynne Pander, DDS;   Location: [facility] OR;  Service: Oral Surgery;  Laterality: N/A;  . ORIF FINGER / THUMB FRACTURE Right        Home Medications    Prior to Admission medications   Medication Sig Start Date End Date Taking? Authorizing Provider  atorvastatin (LIPITOR) 40 MG tablet Take 1 tablet (40 mg total) by mouth at bedtime. 07/13/16   Graciella Freer, PA-C  carvedilol (COREG) 6.25 MG tablet Take 1 tablet (6.25 mg total) by mouth 2 (two) times daily with a meal. 10/02/16   [name], Gwenlyn Found, MD  cetirizine (ZYRTEC ALLERGY) 10 MG tablet Take 1 tablet (10 mg total) by mouth daily. 12/26/16   Luevenia Maxin, Zera Markwardt A, PA-C  digoxin (LANOXIN) 0.125 MG tablet Take 1 tablet (0.125 mg total) by mouth daily. 10/02/16   [name], Gwenlyn Found, MD  fluticasone (FLONASE) 50 MCG/ACT nasal spray Place 2 sprays into both nostrils daily. 12/26/16   Malaak Stach A, PA-C  furosemide (LASIX) 40 MG tablet Take 1.5 tablets (60 mg total) by mouth daily. 10/02/16   [name], Gwenlyn Found, MD  glucose blood test strip Pt uses one touch.  Test BID 11/02/16   [name], Gwenlyn Found, MD  insulin NPH-regular Human (NOVOLIN 70/30) (70-30) 100 UNIT/ML injection Inject 45 Units into the skin 2 (two) times daily. 10/02/16   [name], Gwenlyn Found, MD  Lancets 28G MISC Use for glucose testing up to BID 11/02/16   [name], Gwenlyn Found, MD  Potassium Chloride ER 20 MEQ TBCR Take 20 mEq by mouth daily. 10/02/16   [name], Gwenlyn Found, MD  QUEtiapine (SEROQUEL) 100 MG tablet Take 1.5 tablets (150 mg total) by mouth at bedtime. 12/08/15   Arrien, York Ram, MD  sacubitril-valsartan (ENTRESTO) 49-51 MG Take 1 tablet by mouth 2 (two) times daily. 07/03/16   Sherald Hess, NP    Family History Family History  Problem Relation Age of Onset  . Heart disease Mother        MI  . Heart failure Mother   . Diabetes Mother        ALSO IN MOST OF HIS SIBLINGS; 2 UNLCES HAVE ALSO PASSED AWAY FROM DM  . Cardiomyopathy Mother   . Cancer - Ovarian Mother   . Heart disease Father   .  Hypertension Father   . Diabetes Father     Social History Social History  Substance Use Topics  . Smoking status: Never Smoker  . Smokeless tobacco: Never Used  . Alcohol use No     Allergies   Nsaids   Review of Systems Review of Systems  Constitutional: Negative for chills and fever.  HENT: Positive for congestion, sneezing and sore throat.   Eyes: Negative for visual disturbance.  Respiratory: Positive for cough. Negative for shortness of breath.   Cardiovascular: Negative for chest pain.  Gastrointestinal: Negative for abdominal pain, nausea and vomiting.  Neurological: Positive for headaches.  All other systems reviewed and are negative.  Physical Exam Updated Vital Signs BP (!) 166/101 (BP Location: Left Arm)   Pulse 84   Temp 99.3 F (37.4 C) (Oral)   Resp 18   Ht 6' (1.829 m)   Wt 129.8 kg (286 lb 4 oz)   SpO2 96%   BMI 38.82 kg/m   Physical Exam  Constitutional: He appears well-developed and well-nourished.  HENT:  Head: Normocephalic and atraumatic.  Right Ear: External ear normal.  Left Ear: External ear normal.  Mouth/Throat: Oropharynx is clear and moist.  TMs normal bilaterally. No maxillary or frontal sinus TTP. Nasal septum is midline with pale pink boggy mucosa and nasal congestion noted. Posterior oropharynx mildly erythematous without tonsillar hypertrophy, uvular deviation, exudates. There is postnasal drip noted.  Eyes: EOM are normal. Pupils are equal, round, and reactive to light. Right eye exhibits no discharge. Left eye exhibits no discharge.  Neck: No JVD present. No tracheal deviation present.  Cardiovascular: Normal rate, regular rhythm and normal heart sounds.   Pulmonary/Chest: Effort normal and breath sounds normal. He has no wheezes. He exhibits no tenderness.  Abdominal: Soft. He exhibits no distension. There is no tenderness.  Musculoskeletal: He exhibits no edema.  Neurological: He is alert.  Skin: Skin is warm and dry.  Capillary refill takes less than 2 seconds.  Psychiatric: He has a normal mood and affect. His behavior is normal.     ED Treatments / Results  Labs (all labs ordered are listed, but only abnormal results are displayed) Labs Reviewed - No data to display  EKG  EKG Interpretation None       Radiology No results found.  Procedures Procedures (including critical care time)  Medications Ordered in ED Medications - No data to display   Initial Impression / Assessment and Plan / ED Course  I have reviewed the triage vital signs and the nursing notes.  Pertinent labs & imaging results that were available during my care of the patient were reviewed by me and considered in my medical decision making (see chart for details).     Patient with chief complaint cough, sneezing, headache which has significantly improved. Afebrile, vital signs at patient's baseline. He states he has not taken his blood pressure medication today. He has had similar symptoms earlier this month which were successfully treated with Flonase and Zyrtec. Suspect allergic rhinitis due to environmental exposure. No new focal weakness or stroke-like symptoms. Low suspicion of pneumonia, bronchitis, sinusitis. Will discharge home with refill of Zyrtec and Flonase. Recommend follow-up with primary care for reevaluation and encouraged patient to take his blood pressure medications as prescribed. Discussed indications for return to the ED. Pt verbalized understanding of and agreement with plan and is safe for discharge home at this time.   Final Clinical Impressions(s) / ED Diagnoses   Final diagnoses:  Seasonal allergic rhinitis due to other allergic trigger    New Prescriptions New Prescriptions   CETIRIZINE (ZYRTEC ALLERGY) 10 MG TABLET    Take 1 tablet (10 mg total) by mouth daily.   FLUTICASONE (FLONASE) 50 MCG/ACT NASAL SPRAY    Place 2 sprays into both nostrils daily.     Jeanie Sewer, PA-C 12/26/16  1715    Vanetta Mulders, MD 12/28/16 1126

## 2016-12-26 NOTE — ED Triage Notes (Signed)
Pt c/o nasal congestion, HA after mowing grass this am-NAD-steady gait

## 2017-02-15 ENCOUNTER — Encounter (HOSPITAL_BASED_OUTPATIENT_CLINIC_OR_DEPARTMENT_OTHER): Payer: No Typology Code available for payment source | Attending: Internal Medicine

## 2017-02-15 DIAGNOSIS — I10 Essential (primary) hypertension: Secondary | ICD-10-CM | POA: Insufficient documentation

## 2017-02-15 DIAGNOSIS — E11621 Type 2 diabetes mellitus with foot ulcer: Secondary | ICD-10-CM | POA: Insufficient documentation

## 2017-02-15 DIAGNOSIS — G4730 Sleep apnea, unspecified: Secondary | ICD-10-CM | POA: Diagnosis not present

## 2017-02-15 DIAGNOSIS — E1140 Type 2 diabetes mellitus with diabetic neuropathy, unspecified: Secondary | ICD-10-CM | POA: Insufficient documentation

## 2017-02-15 DIAGNOSIS — I428 Other cardiomyopathies: Secondary | ICD-10-CM | POA: Diagnosis not present

## 2017-02-15 DIAGNOSIS — L97511 Non-pressure chronic ulcer of other part of right foot limited to breakdown of skin: Secondary | ICD-10-CM | POA: Diagnosis not present

## 2017-02-15 DIAGNOSIS — Z8673 Personal history of transient ischemic attack (TIA), and cerebral infarction without residual deficits: Secondary | ICD-10-CM | POA: Diagnosis not present

## 2017-02-15 DIAGNOSIS — Z794 Long term (current) use of insulin: Secondary | ICD-10-CM | POA: Diagnosis not present

## 2017-02-15 DIAGNOSIS — I252 Old myocardial infarction: Secondary | ICD-10-CM | POA: Diagnosis not present

## 2017-03-13 ENCOUNTER — Other Ambulatory Visit: Payer: Self-pay

## 2017-03-13 DIAGNOSIS — Z794 Long term (current) use of insulin: Principal | ICD-10-CM

## 2017-03-13 DIAGNOSIS — E1165 Type 2 diabetes mellitus with hyperglycemia: Secondary | ICD-10-CM

## 2017-03-13 DIAGNOSIS — IMO0002 Reserved for concepts with insufficient information to code with codable children: Secondary | ICD-10-CM

## 2017-03-13 DIAGNOSIS — E1169 Type 2 diabetes mellitus with other specified complication: Principal | ICD-10-CM

## 2017-03-13 MED ORDER — INSULIN NPH ISOPHANE & REGULAR (70-30) 100 UNIT/ML ~~LOC~~ SUSP: 45.0000 [IU] | Freq: Two times a day (BID) | SUBCUTANEOUS | 0 refills | Status: DC

## 2017-03-18 ENCOUNTER — Encounter (HOSPITAL_BASED_OUTPATIENT_CLINIC_OR_DEPARTMENT_OTHER): Payer: Self-pay | Admitting: *Deleted

## 2017-03-18 ENCOUNTER — Emergency Department (HOSPITAL_BASED_OUTPATIENT_CLINIC_OR_DEPARTMENT_OTHER): Payer: No Typology Code available for payment source

## 2017-03-18 ENCOUNTER — Emergency Department (HOSPITAL_BASED_OUTPATIENT_CLINIC_OR_DEPARTMENT_OTHER)
Admission: EM | Admit: 2017-03-18 | Discharge: 2017-03-18 | Disposition: A | Discharge status: Home / Self Care | Payer: No Typology Code available for payment source | Attending: Emergency Medicine | Admitting: Emergency Medicine

## 2017-03-18 DIAGNOSIS — E119 Type 2 diabetes mellitus without complications: Secondary | ICD-10-CM | POA: Diagnosis not present

## 2017-03-18 DIAGNOSIS — L03012 Cellulitis of left finger: Secondary | ICD-10-CM | POA: Insufficient documentation

## 2017-03-18 DIAGNOSIS — M79645 Pain in left finger(s): Secondary | ICD-10-CM | POA: Diagnosis present

## 2017-03-18 DIAGNOSIS — I5041 Acute combined systolic (congestive) and diastolic (congestive) heart failure: Secondary | ICD-10-CM | POA: Diagnosis not present

## 2017-03-18 DIAGNOSIS — Z794 Long term (current) use of insulin: Secondary | ICD-10-CM | POA: Insufficient documentation

## 2017-03-18 DIAGNOSIS — Z79899 Other long term (current) drug therapy: Secondary | ICD-10-CM | POA: Insufficient documentation

## 2017-03-18 DIAGNOSIS — I10 Essential (primary) hypertension: Secondary | ICD-10-CM | POA: Insufficient documentation

## 2017-03-18 MED ORDER — ACETAMINOPHEN 500 MG PO TABS
1000.0000 mg | ORAL_TABLET | Freq: Once | ORAL | Status: AC
  Administered 2017-03-18: 1000 mg via ORAL
  Filled 2017-03-18: qty 2

## 2017-03-18 MED ORDER — DOXYCYCLINE HYCLATE 100 MG PO TABS
100.0000 mg | ORAL_TABLET | Freq: Once | ORAL | Status: AC
  Administered 2017-03-18: 100 mg via ORAL
  Filled 2017-03-18: qty 1

## 2017-03-18 MED ORDER — DOXYCYCLINE HYCLATE 100 MG PO CAPS: 100.0000 mg | ORAL_CAPSULE | Freq: Two times a day (BID) | ORAL | 0 refills | Status: DC

## 2017-03-18 MED ORDER — LIDOCAINE VISCOUS 2 % MT SOLN
15.0000 mL | Freq: Once | OROMUCOSAL | Status: AC
  Administered 2017-03-18: 15 mL via OROMUCOSAL
  Filled 2017-03-18: qty 15

## 2017-03-18 NOTE — ED Provider Notes (Addendum)
[facility] Note   CSN: [id number] Arrival date & time: 03/18/17  0055     History   Chief Complaint Chief Complaint  Patient presents with  . Finger Injury    HPI Mitchell [name] is a 46 y.o. male.  The history is provided by the patient.  Hand Pain  This is a new problem. The current episode started more than 2 days ago. The problem occurs constantly. The problem has been gradually worsening. Pertinent negatives include no chest pain, no abdominal pain, no headaches and no shortness of breath. Nothing aggravates the symptoms. Nothing relieves the symptoms. He has tried nothing for the symptoms. The treatment provided no relief.    Past Medical History:  Diagnosis Date  . Bipolar disorder (HCC)   . CHF (congestive heart failure) (HCC)   . Chronic systolic heart failure (HCC) 11/2010   a. NICM 4/14 EF 20-25%, TR mild  . CVA (cerebral infarction)    No residual deficits  . Depression    PTSD,   . Diabetes mellitus    TYPE II; UNCONTROLLED BY HEMOGLOBIN A1c; STABLE AS  PER DISCHARGE  . Headache(784.0)   . 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 History of colonic polyps   . Hyperlipidemia   . Hypertension   . Myocardial infarction [facility])    at age 61 years old (while playing football)  . Non-ischemic cardiomyopathy (HCC)    No ischemia on myoview, showed EF of 42%. 2D echo showed EF 50-55% with diatolic dysfunction in 2009  . Obesity   . OSA (obstructive sleep apnea)    Being set up again for c-pap  . Pneumonia   . Post-cardiac injury syndrome (HCC)    History of cardiac injury from blunt trauma  . PVCs (premature ventricular contractions)   . Schizophrenia (HCC)    Goes to [facility]  . SOB (shortness of breath)   . Stroke [facility]) 2005   some left side weakness  . Syncope    Recurrent, thought to be vasovagal. Also has h/o frequent PVCs.     Patient Active Problem List   Diagnosis Date Noted  . Acute combined systolic and  diastolic congestive heart failure (HCC)   . Noncompliance 01/22/2014  . Acute respiratory failure with hypoxia (HCC) 11/25/2013  . Chronic systolic heart failure (HCC) 10/02/2011  . Erectile dysfunction 10/02/2011  . Acute on chronic systolic heart failure (HCC) 09/23/2011  . Obstructive sleep apnea 10/11/2007  . Type II diabetes mellitus, uncontrolled 10/10/2007  . HLD (hyperlipidemia) 10/10/2007  . HYPOKALEMIA 10/10/2007  . OBESITY 10/10/2007  . Essential hypertension 10/10/2007  . PREMATURE VENTRICULAR CONTRACTIONS 10/10/2007  . SYNCOPE 10/10/2007  . COLONIC POLYPS, HX OF 10/10/2007    Past Surgical History:  Procedure Laterality Date  . CARDIAC CATHETERIZATION  12/19/10   DIFFUSE NONOBSTRUCTIVE CAD; NONISCHEMIC CARDIOMYOPATHY; LEFT VENTRICULAR ANGIOGRAM WAS PERFORMED SECONDARY TO  ELEVATED LEFT VENTRICULAR FILLING PRESSURES  . COLONOSCOPY W/ POLYPECTOMY    . MULTIPLE EXTRACTIONS WITH ALVEOLOPLASTY  01/27/2014   "all my teeth; 4 Quadrants of alveoloplasty  . MULTIPLE EXTRACTIONS WITH ALVEOLOPLASTY N/A 01/27/2014   Procedure: EXTRACTION OF TEETH #'1, 2, 3, 4, 5, 6, 7, 8, 9, 10, 11, 12, 13, 14, 15, 16, 17, 20, 21, 22, 23, 24, 25, 26, 27, 28, 29, 31 and 32 WITH ALVEOLOPLASTY;  Surgeon: Charlynne Pander, DDS;  Location: [facility] OR;  Service: Oral Surgery;  Laterality: N/A;  . ORIF FINGER / THUMB FRACTURE Right  Home Medications    Prior to Admission medications   Medication Sig Start Date End Date Taking? Authorizing Provider  atorvastatin (LIPITOR) 40 MG tablet Take 1 tablet (40 mg total) by mouth at bedtime. 07/13/16   Graciella Freer, PA-C  carvedilol (COREG) 6.25 MG tablet Take 1 tablet (6.25 mg total) by mouth 2 (two) times daily with a meal. 10/02/16   [name], Gwenlyn Found, MD  cetirizine (ZYRTEC ALLERGY) 10 MG tablet Take 1 tablet (10 mg total) by mouth daily. 12/26/16   Luevenia Maxin, Mina A, PA-C  digoxin (LANOXIN) 0.125 MG tablet Take 1 tablet (0.125 mg total) by mouth  daily. 10/02/16   [name], Gwenlyn Found, MD  fluticasone (FLONASE) 50 MCG/ACT nasal spray Place 2 sprays into both nostrils daily. 12/26/16   Fawze, Mina A, PA-C  furosemide (LASIX) 40 MG tablet Take 1.5 tablets (60 mg total) by mouth daily. 10/02/16   [name], Gwenlyn Found, MD  glucose blood test strip Pt uses one touch.  Test BID 11/02/16   [name], Gwenlyn Found, MD  insulin NPH-regular Human (NOVOLIN 70/30) (70-30) 100 UNIT/ML injection Inject 45 Units into the skin 2 (two) times daily. 03/13/17   [name], Gwenlyn Found, MD  Lancets 28G MISC Use for glucose testing up to BID 11/02/16   [name], Gwenlyn Found, MD  Potassium Chloride ER 20 MEQ TBCR Take 20 mEq by mouth daily. 10/02/16   [name], Gwenlyn Found, MD  QUEtiapine (SEROQUEL) 100 MG tablet Take 1.5 tablets (150 mg total) by mouth at bedtime. 12/08/15   Arrien, York Ram, MD  sacubitril-valsartan (ENTRESTO) 49-51 MG Take 1 tablet by mouth 2 (two) times daily. 07/03/16   Sherald Hess, NP    Family History Family History  Problem Relation Age of Onset  . Heart disease Mother        MI  . Heart failure Mother   . Diabetes Mother        ALSO IN MOST OF HIS SIBLINGS; 2 UNLCES HAVE ALSO PASSED AWAY FROM DM  . Cardiomyopathy Mother   . Cancer - Ovarian Mother   . Heart disease Father   . Hypertension Father   . Diabetes Father     Social History Social History  Substance Use Topics  . Smoking status: Never Smoker  . Smokeless tobacco: Never Used  . Alcohol use No     Allergies   Nsaids   Review of Systems Review of Systems  Respiratory: Negative for shortness of breath.   Cardiovascular: Negative for chest pain.  Gastrointestinal: Negative for abdominal pain.  Neurological: Negative for headaches.  All other systems reviewed and are negative.    Physical Exam Updated Vital Signs BP (!) 171/88 (BP Location: Right Arm)   Pulse (!) 101   Temp 98.2 F (36.8 C) (Oral)   Resp 20   SpO2 100%   Physical Exam  Constitutional: He is oriented  to person, place, and time. He appears well-developed and well-nourished. No distress.  HENT:  Head: Normocephalic and atraumatic.  Mouth/Throat: No oropharyngeal exudate.  Eyes: Pupils are equal, round, and reactive to light. Conjunctivae are normal.  Neck: Normal range of motion. Neck supple.  Cardiovascular: Normal rate, regular rhythm, normal heart sounds and intact distal pulses.   Pulmonary/Chest: Effort normal and breath sounds normal. He has no wheezes. He has no rales.  Abdominal: Soft. Bowel sounds are normal. He exhibits no mass. There is no tenderness. There is no rebound and no guarding.  Musculoskeletal: Normal range of motion.  Left hand: He exhibits normal range of motion and normal capillary refill. Normal sensation noted. Normal strength noted.       Hands: Neurological: He is alert and oriented to person, place, and time.  Skin: Skin is warm and dry. Capillary refill takes less than 2 seconds.  Psychiatric: He has a normal mood and affect.     ED Treatments / Results   Vitals:   03/18/17 0059  BP: (!) 171/88  Pulse: (!) 101  Resp: 20  Temp: 98.2 F (36.8 C)  SpO2: 100%   Radiology  Results for orders placed or performed in visit on 11/02/16  Basic metabolic panel  Result Value Ref Range   Sodium 140 135 - 145 mEq/L   Potassium 3.7 3.5 - 5.1 mEq/L   Chloride 104 96 - 112 mEq/L   CO2 30 19 - 32 mEq/L   Glucose, Bld 283 (H) 70 - 99 mg/dL   BUN 11 6 - 23 mg/dL   Creatinine, Ser 1.61 0.40 - 1.50 mg/dL   Calcium 8.6 8.4 - 09.6 mg/dL   GFR 045.40 >98.11 mL/min   Dg Finger Thumb Left  Result Date: 03/18/2017 CLINICAL DATA:  Swelling and pus after falling of head nail 3 days ago. History of diabetes. EXAM: LEFT THUMB 2+V COMPARISON:  LEFT hand radiograph June 21, 2015 FINDINGS: There is no evidence of fracture or dislocation. Similar rounded lucency base of first distal phalanx, suggesting interosseous cyst. There is no evidence of arthropathy or other  focal bone abnormality. Bandage overlies the first digit without subcutaneous gas or radiopaque foreign bodies. IMPRESSION: Bandage overlying first digit. Electronically Signed   By: Awilda Metro M.D.   On: 03/18/2017 02:04    Procedures .[name] KitchenIncision and Drainage Date/Time: 03/18/2017 2:08 AM Performed by: Cy Blamer Authorized by: Cy Blamer   Consent:    Consent obtained:  Verbal   Consent given by:  Patient   Risks discussed:  Bleeding Location:    Indications for incision and drainage: paronychia.   Location:  Upper extremity   Upper extremity location:  Finger   Finger location:  L thumb Pre-procedure details:    Skin preparation:  Betadine Anesthesia (see MAR for exact dosages):    Anesthesia method:  Topical application   Topical anesthetic:  Lidocaine gel Procedure type:    Complexity:  Simple Procedure details:    Needle aspiration: no     Incision types:  Single straight (lifting of cuticle)   Incision depth:  Dermal   Scalpel blade:  11   Drainage:  Purulent   Drainage amount:  Scant   Wound treatment:  Wound left open   Packing materials:  None Post-procedure details:    Patient tolerance of procedure:  Tolerated well, no immediate complications   (including critical care time)  Medications Ordered in ED Medications  doxycycline (VIBRA-TABS) tablet 100 mg (not administered)  acetaminophen (TYLENOL) tablet 1,000 mg (1,000 mg Oral Given 03/18/17 0123)  lidocaine (XYLOCAINE) 2 % viscous mouth solution 15 mL (15 mLs Mouth/Throat Given 03/18/17 0123)     Final Clinical Impressions(s) / ED Diagnoses  Paronychia:  Given history will start antibiotics and have patient follow up with hand surgery.  The patient is very well appearing and has been observed in the ED.  Strict return precautions given for  intractable Headache, changes in vision or thinking, chest pain, dyspnea on exertion, weakness, vomiting, diarrhea,  Inability to tolerate liquids or food,  fevers > 101, purulent drainage worsening swelling, streaking  up hand, altered mental status or any concerns. No signs of systemic illness or infection. The patient is nontoxic-appearing on exam and vital signs are within normal limits.    I have reviewed the triage vital signs and the nursing notes. Pertinent labs &imaging results that were available during my care of the patient were reviewed by me and considered in my medical decision making (see chart for details).  After history, exam, and medical workup I feel the patient has been appropriately medically screened and is safe for discharge home. Pertinent diagnoses were discussed with the patient. Patient was given return precautions.  New Prescriptions New Prescriptions   No medications on file     Karianne Nogueira, MD 03/18/17 Milton Ferguson, Martiza Speth, MD 03/18/17 1478

## 2017-03-18 NOTE — ED Triage Notes (Signed)
Pt reports right thumb nail pain. Swelling noted around cuticle.

## 2017-03-19 ENCOUNTER — Telehealth: Payer: Self-pay

## 2017-03-19 ENCOUNTER — Encounter (HOSPITAL_BASED_OUTPATIENT_CLINIC_OR_DEPARTMENT_OTHER): Payer: No Typology Code available for payment source | Attending: Internal Medicine

## 2017-03-19 DIAGNOSIS — I252 Old myocardial infarction: Secondary | ICD-10-CM | POA: Diagnosis not present

## 2017-03-19 DIAGNOSIS — G4730 Sleep apnea, unspecified: Secondary | ICD-10-CM | POA: Insufficient documentation

## 2017-03-19 DIAGNOSIS — I110 Hypertensive heart disease with heart failure: Secondary | ICD-10-CM | POA: Insufficient documentation

## 2017-03-19 DIAGNOSIS — I509 Heart failure, unspecified: Secondary | ICD-10-CM | POA: Diagnosis not present

## 2017-03-19 DIAGNOSIS — E1142 Type 2 diabetes mellitus with diabetic polyneuropathy: Secondary | ICD-10-CM | POA: Insufficient documentation

## 2017-03-19 DIAGNOSIS — Z09 Encounter for follow-up examination after completed treatment for conditions other than malignant neoplasm: Secondary | ICD-10-CM | POA: Diagnosis present

## 2017-03-19 DIAGNOSIS — Z8631 Personal history of diabetic foot ulcer: Secondary | ICD-10-CM | POA: Diagnosis not present

## 2017-03-19 NOTE — Telephone Encounter (Signed)
PA initiated via Covermymeds; KEY: [id number]. Awaiting determination.

## 2017-03-20 NOTE — Telephone Encounter (Signed)
Spoke w/ [facility]: Formulary product is Novolin 70/30, PA not needed.

## 2017-05-08 ENCOUNTER — Encounter (HOSPITAL_COMMUNITY): Payer: Self-pay | Admitting: Internal Medicine

## 2017-05-08 ENCOUNTER — Observation Stay (HOSPITAL_COMMUNITY)
Admission: EM | Admit: 2017-05-08 | Discharge: 2017-05-09 | Disposition: A | Discharge status: Home / Self Care | Payer: No Typology Code available for payment source | Attending: Internal Medicine | Admitting: Internal Medicine

## 2017-05-08 ENCOUNTER — Emergency Department (HOSPITAL_COMMUNITY): Payer: No Typology Code available for payment source

## 2017-05-08 ENCOUNTER — Observation Stay (HOSPITAL_COMMUNITY): Payer: No Typology Code available for payment source

## 2017-05-08 ENCOUNTER — Observation Stay (HOSPITAL_BASED_OUTPATIENT_CLINIC_OR_DEPARTMENT_OTHER): Payer: No Typology Code available for payment source

## 2017-05-08 DIAGNOSIS — R4701 Aphasia: Secondary | ICD-10-CM | POA: Diagnosis not present

## 2017-05-08 DIAGNOSIS — I6789 Other cerebrovascular disease: Secondary | ICD-10-CM

## 2017-05-08 DIAGNOSIS — E118 Type 2 diabetes mellitus with unspecified complications: Secondary | ICD-10-CM

## 2017-05-08 DIAGNOSIS — E785 Hyperlipidemia, unspecified: Secondary | ICD-10-CM | POA: Diagnosis present

## 2017-05-08 DIAGNOSIS — D72829 Elevated white blood cell count, unspecified: Secondary | ICD-10-CM | POA: Diagnosis present

## 2017-05-08 DIAGNOSIS — I110 Hypertensive heart disease with heart failure: Secondary | ICD-10-CM | POA: Insufficient documentation

## 2017-05-08 DIAGNOSIS — I10 Essential (primary) hypertension: Secondary | ICD-10-CM | POA: Diagnosis present

## 2017-05-08 DIAGNOSIS — Z6837 Body mass index (BMI) 37.0-37.9, adult: Secondary | ICD-10-CM | POA: Insufficient documentation

## 2017-05-08 DIAGNOSIS — E1165 Type 2 diabetes mellitus with hyperglycemia: Secondary | ICD-10-CM | POA: Diagnosis not present

## 2017-05-08 DIAGNOSIS — R29810 Facial weakness: Secondary | ICD-10-CM | POA: Insufficient documentation

## 2017-05-08 DIAGNOSIS — G4733 Obstructive sleep apnea (adult) (pediatric): Secondary | ICD-10-CM | POA: Diagnosis present

## 2017-05-08 DIAGNOSIS — E860 Dehydration: Secondary | ICD-10-CM | POA: Insufficient documentation

## 2017-05-08 DIAGNOSIS — Z7902 Long term (current) use of antithrombotics/antiplatelets: Secondary | ICD-10-CM | POA: Diagnosis not present

## 2017-05-08 DIAGNOSIS — G459 Transient cerebral ischemic attack, unspecified: Secondary | ICD-10-CM | POA: Diagnosis not present

## 2017-05-08 DIAGNOSIS — E669 Obesity, unspecified: Secondary | ICD-10-CM | POA: Diagnosis not present

## 2017-05-08 DIAGNOSIS — Z79899 Other long term (current) drug therapy: Secondary | ICD-10-CM | POA: Insufficient documentation

## 2017-05-08 DIAGNOSIS — I5022 Chronic systolic (congestive) heart failure: Secondary | ICD-10-CM | POA: Diagnosis present

## 2017-05-08 DIAGNOSIS — Z7982 Long term (current) use of aspirin: Secondary | ICD-10-CM | POA: Insufficient documentation

## 2017-05-08 DIAGNOSIS — I639 Cerebral infarction, unspecified: Secondary | ICD-10-CM | POA: Insufficient documentation

## 2017-05-08 DIAGNOSIS — E66813 Obesity, class 3: Secondary | ICD-10-CM | POA: Diagnosis present

## 2017-05-08 DIAGNOSIS — F209 Schizophrenia, unspecified: Secondary | ICD-10-CM | POA: Insufficient documentation

## 2017-05-08 DIAGNOSIS — N179 Acute kidney failure, unspecified: Secondary | ICD-10-CM | POA: Diagnosis not present

## 2017-05-08 DIAGNOSIS — Z886 Allergy status to analgesic agent status: Secondary | ICD-10-CM | POA: Insufficient documentation

## 2017-05-08 DIAGNOSIS — Z794 Long term (current) use of insulin: Secondary | ICD-10-CM | POA: Diagnosis not present

## 2017-05-08 DIAGNOSIS — R531 Weakness: Secondary | ICD-10-CM | POA: Diagnosis present

## 2017-05-08 DIAGNOSIS — R4781 Slurred speech: Secondary | ICD-10-CM

## 2017-05-08 LAB — CBC
HCT: 42.1 % (ref 39.0–52.0)
HEMOGLOBIN: 14.4 g/dL (ref 13.0–17.0)
MCH: 29.8 pg (ref 26.0–34.0)
MCHC: 34.2 g/dL (ref 30.0–36.0)
MCV: 87.2 fL (ref 78.0–100.0)
Platelets: 180 10*3/uL (ref 150–400)
RBC: 4.83 MIL/uL (ref 4.22–5.81)
RDW: 12.4 % (ref 11.5–15.5)
WBC: 14 10*3/uL — ABNORMAL HIGH (ref 4.0–10.5)

## 2017-05-08 LAB — LIPID PANEL
CHOL/HDL RATIO: 4.8 ratio
Cholesterol: 211 mg/dL — ABNORMAL HIGH (ref 0–200)
HDL: 44 mg/dL (ref >40)
LDL CALC: 127 mg/dL — AB (ref 0–99)
TRIGLYCERIDES: 201 mg/dL — AB (ref <150)
VLDL: 40 mg/dL (ref 0–40)

## 2017-05-08 LAB — URINALYSIS, ROUTINE W REFLEX MICROSCOPIC
BACTERIA UA: NONE SEEN
BILIRUBIN URINE: NEGATIVE
GLUCOSE, UA: NEGATIVE mg/dL
Hgb urine dipstick: NEGATIVE
KETONES UR: NEGATIVE mg/dL
Leukocytes, UA: NEGATIVE
Nitrite: NEGATIVE
PH: 5 (ref 5.0–8.0)
PROTEIN: 100 mg/dL — AB
Specific Gravity, Urine: 1.019 (ref 1.005–1.030)

## 2017-05-08 LAB — COMPREHENSIVE METABOLIC PANEL
ALBUMIN: 4.1 g/dL (ref 3.5–5.0)
ALT: 14 U/L — ABNORMAL LOW (ref 17–63)
AST: 34 U/L (ref 15–41)
Alkaline Phosphatase: 88 U/L (ref 38–126)
Anion gap: 12 (ref 5–15)
BUN: 21 mg/dL — AB (ref 6–20)
CHLORIDE: 103 mmol/L (ref 101–111)
CO2: 26 mmol/L (ref 22–32)
Calcium: 9.6 mg/dL (ref 8.9–10.3)
Creatinine, Ser: 1.39 mg/dL — ABNORMAL HIGH (ref 0.61–1.24)
GFR calc Af Amer: >60 mL/min (ref >60)
GFR calc non Af Amer: 59 mL/min — ABNORMAL LOW (ref >60)
GLUCOSE: 131 mg/dL — AB (ref 65–99)
POTASSIUM: 3.8 mmol/L (ref 3.5–5.1)
Sodium: 141 mmol/L (ref 135–145)
Total Bilirubin: 1.1 mg/dL (ref 0.3–1.2)
Total Protein: 7.6 g/dL (ref 6.5–8.1)

## 2017-05-08 LAB — DIFFERENTIAL
BASOS ABS: 0 10*3/uL (ref 0.0–0.1)
BASOS PCT: 0 %
EOS ABS: 0.1 10*3/uL (ref 0.0–0.7)
Eosinophils Relative: 1 %
LYMPHS ABS: 2.1 10*3/uL (ref 0.7–4.0)
Lymphocytes Relative: 15 %
Monocytes Absolute: 0.9 10*3/uL (ref 0.1–1.0)
Monocytes Relative: 7 %
NEUTROS PCT: 77 %
Neutro Abs: 10.8 10*3/uL — ABNORMAL HIGH (ref 1.7–7.7)

## 2017-05-08 LAB — CBG MONITORING, ED
Glucose-Capillary: 137 mg/dL — ABNORMAL HIGH (ref 65–99)
Glucose-Capillary: 180 mg/dL — ABNORMAL HIGH (ref 65–99)
Glucose-Capillary: 164 mg/dL — ABNORMAL HIGH (ref 65–99)

## 2017-05-08 LAB — PROTIME-INR
INR: 0.98
Prothrombin Time: 12.9 seconds (ref 11.4–15.2)

## 2017-05-08 LAB — I-STAT CHEM 8, ED
BUN: 29 mg/dL — AB (ref 6–20)
CHLORIDE: 103 mmol/L (ref 101–111)
Calcium, Ion: 1.19 mmol/L (ref 1.15–1.40)
Creatinine, Ser: 1.4 mg/dL — ABNORMAL HIGH (ref 0.61–1.24)
Glucose, Bld: 133 mg/dL — ABNORMAL HIGH (ref 65–99)
HEMATOCRIT: 43 % (ref 39.0–52.0)
Hemoglobin: 14.6 g/dL (ref 13.0–17.0)
Potassium: 3.7 mmol/L (ref 3.5–5.1)
SODIUM: 144 mmol/L (ref 135–145)
TCO2: 31 mmol/L (ref 22–32)

## 2017-05-08 LAB — RAPID URINE DRUG SCREEN, HOSP PERFORMED
Amphetamines: NOT DETECTED
BENZODIAZEPINES: NOT DETECTED
Barbiturates: NOT DETECTED
COCAINE: NOT DETECTED
Opiates: NOT DETECTED
Tetrahydrocannabinol: NOT DETECTED

## 2017-05-08 LAB — GLUCOSE, CAPILLARY: GLUCOSE-CAPILLARY: 160 mg/dL — AB (ref 65–99)

## 2017-05-08 LAB — I-STAT TROPONIN, ED: Troponin i, poc: 0.02 ng/mL (ref 0.00–0.08)

## 2017-05-08 LAB — APTT: APTT: 28 s (ref 24–36)

## 2017-05-08 LAB — HEMOGLOBIN A1C
Hgb A1c MFr Bld: 12 % — ABNORMAL HIGH (ref 4.8–5.6)
Mean Plasma Glucose: 297.7 mg/dL

## 2017-05-08 LAB — HIV ANTIBODY (ROUTINE TESTING W REFLEX): HIV Screen 4th Generation wRfx: NONREACTIVE

## 2017-05-08 MED ORDER — SODIUM CHLORIDE 0.9 % IV SOLN
INTRAVENOUS | Status: DC
  Administered 2017-05-08 (×2): via INTRAVENOUS

## 2017-05-08 MED ORDER — CLOPIDOGREL BISULFATE 75 MG PO TABS
75.0000 mg | ORAL_TABLET | Freq: Every day | ORAL | Status: DC
  Administered 2017-05-08 – 2017-05-09 (×2): 75 mg via ORAL
  Filled 2017-05-08 (×2): qty 1

## 2017-05-08 MED ORDER — ACETAMINOPHEN 160 MG/5ML PO SOLN: 650.0000 mg | ORAL | Status: DC | PRN

## 2017-05-08 MED ORDER — SENNOSIDES-DOCUSATE SODIUM 8.6-50 MG PO TABS: 1.0000 | ORAL_TABLET | Freq: Every evening | ORAL | Status: DC | PRN

## 2017-05-08 MED ORDER — ENOXAPARIN SODIUM 40 MG/0.4ML ~~LOC~~ SOLN
40.0000 mg | SUBCUTANEOUS | Status: DC
  Administered 2017-05-08: 40 mg via SUBCUTANEOUS
  Filled 2017-05-08 (×2): qty 0.4

## 2017-05-08 MED ORDER — LORATADINE 10 MG PO TABS
10.0000 mg | ORAL_TABLET | Freq: Every day | ORAL | Status: DC
  Administered 2017-05-08 – 2017-05-09 (×2): 10 mg via ORAL
  Filled 2017-05-08 (×2): qty 1

## 2017-05-08 MED ORDER — INSULIN ASPART 100 UNIT/ML ~~LOC~~ SOLN
0.0000 [IU] | Freq: Three times a day (TID) | SUBCUTANEOUS | Status: DC
  Administered 2017-05-08 (×3): 3 [IU] via SUBCUTANEOUS
  Administered 2017-05-09 (×2): 8 [IU] via SUBCUTANEOUS
  Filled 2017-05-08 (×2): qty 1

## 2017-05-08 MED ORDER — FUROSEMIDE 20 MG PO TABS: 60.0000 mg | ORAL_TABLET | Freq: Every day | ORAL | Status: DC

## 2017-05-08 MED ORDER — LORAZEPAM 2 MG/ML IJ SOLN
0.5000 mg | Freq: Once | INTRAMUSCULAR | Status: AC
  Administered 2017-05-08: 0.5 mg via INTRAVENOUS
  Filled 2017-05-08: qty 1

## 2017-05-08 MED ORDER — POTASSIUM CHLORIDE CRYS ER 20 MEQ PO TBCR
20.0000 meq | EXTENDED_RELEASE_TABLET | Freq: Every day | ORAL | Status: DC
  Administered 2017-05-08: 20 meq via ORAL
  Filled 2017-05-08: qty 1

## 2017-05-08 MED ORDER — INSULIN ASPART 100 UNIT/ML ~~LOC~~ SOLN
0.0000 [IU] | Freq: Every day | SUBCUTANEOUS | Status: DC
  Administered 2017-05-08: 4 [IU] via SUBCUTANEOUS

## 2017-05-08 MED ORDER — PERFLUTREN LIPID MICROSPHERE
1.0000 mL | INTRAVENOUS | Status: AC | PRN
  Administered 2017-05-08: 2 mL via INTRAVENOUS
  Filled 2017-05-08: qty 10

## 2017-05-08 MED ORDER — ACETAMINOPHEN 650 MG RE SUPP: 650.0000 mg | RECTAL | Status: DC | PRN

## 2017-05-08 MED ORDER — FLUTICASONE PROPIONATE 50 MCG/ACT NA SUSP
2.0000 | Freq: Every day | NASAL | Status: DC
  Filled 2017-05-08 (×2): qty 16

## 2017-05-08 MED ORDER — SACUBITRIL-VALSARTAN 49-51 MG PO TABS
1.0000 | ORAL_TABLET | Freq: Two times a day (BID) | ORAL | Status: DC
  Administered 2017-05-09: 1 via ORAL
  Filled 2017-05-08: qty 1

## 2017-05-08 MED ORDER — FUROSEMIDE 40 MG PO TABS
60.0000 mg | ORAL_TABLET | Freq: Every day | ORAL | Status: DC
  Administered 2017-05-09: 60 mg via ORAL
  Filled 2017-05-08: qty 3
  Filled 2017-05-08: qty 1

## 2017-05-08 MED ORDER — STROKE: EARLY STAGES OF RECOVERY BOOK
Freq: Once | Status: DC
  Filled 2017-05-08: qty 1

## 2017-05-08 MED ORDER — ATORVASTATIN CALCIUM 80 MG PO TABS
80.0000 mg | ORAL_TABLET | Freq: Every day | ORAL | Status: DC
  Administered 2017-05-08: 80 mg via ORAL
  Filled 2017-05-08: qty 1

## 2017-05-08 MED ORDER — DIGOXIN 125 MCG PO TABS
0.1250 mg | ORAL_TABLET | Freq: Every day | ORAL | Status: DC
  Administered 2017-05-08 – 2017-05-09 (×2): 0.125 mg via ORAL
  Filled 2017-05-08 (×2): qty 1

## 2017-05-08 MED ORDER — SACUBITRIL-VALSARTAN 49-51 MG PO TABS: 1.0000 | ORAL_TABLET | Freq: Two times a day (BID) | ORAL | Status: DC

## 2017-05-08 MED ORDER — ATORVASTATIN CALCIUM 40 MG PO TABS: 40.0000 mg | ORAL_TABLET | Freq: Every day | ORAL | Status: DC

## 2017-05-08 MED ORDER — ACETAMINOPHEN 325 MG PO TABS: 650.0000 mg | ORAL_TABLET | ORAL | Status: DC | PRN

## 2017-05-08 NOTE — ED Triage Notes (Signed)
Pt brought in [facility] from his work were he had a reported episode of generalized weakness, right sided facial droop, and slurred speech. Pt reportedly slumped over a table but denies any LOC. Pt reports hx of 3 strokes and 1 MI.

## 2017-05-08 NOTE — ED Notes (Addendum)
Spoke with admitting doctor for orders for claustrophobia for MRI. Awaiting orders at this time.

## 2017-05-08 NOTE — ED Notes (Signed)
Patient transported to Ultrasound 

## 2017-05-08 NOTE — Consult Note (Signed)
Referring Physician: Dr. Blinda Leatherwood    Chief Complaint: Sudden onset of diffuse weakness and slurred speech  HPI: Mitchell [name] is an 46 y.o. male presenting with complaint of diffuse weakness beginning at work tonight. LKN was 12:30. He told his supervisor that he was diffusely weak and not feeling well. Per patient, he was asked to continue working. He later became significantly weaker and slumped forward at a table. Speech was noted to be slurred. EMS was called and CBG was > 100 on scene. He has a prior history of strokes x 3, all with right sided weakness. He has residual right sided weakness and facial droop. He has a prior history of MI.    LSN: 12:30 AM tPA Given: No: Diffuse weakness suggestive of etiology other than stroke. Deficits on exam are attributable to prior strokes; no findings suggestive of a significant acute deficit.   Past Medical History:  Diagnosis Date  . Bipolar disorder (HCC)   . CHF (congestive heart failure) (HCC)   . Chronic systolic heart failure (HCC) 11/2010   a. NICM 4/14 EF 20-25%, TR mild  . CVA (cerebral infarction)    No residual deficits  . Depression    PTSD,   . Diabetes mellitus    TYPE II; UNCONTROLLED BY HEMOGLOBIN A1c; STABLE AS  PER DISCHARGE  . Headache(784.0)   . 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 History of colonic polyps   . Hyperlipidemia   . Hypertension   . Myocardial infarction [facility])    at age 45 years old (while playing football)  . Non-ischemic cardiomyopathy (HCC)    No ischemia on myoview, showed EF of 42%. 2D echo showed EF 50-55% with diatolic dysfunction in 2009  . Obesity   . OSA (obstructive sleep apnea)    Being set up again for c-pap  . Pneumonia   . Post-cardiac injury syndrome (HCC)    History of cardiac injury from blunt trauma  . PVCs (premature ventricular contractions)   . Schizophrenia (HCC)    Goes to [facility]  . SOB (shortness of breath)   . Stroke [facility]) 2005   some left side  weakness  . Syncope    Recurrent, thought to be vasovagal. Also has h/o frequent PVCs.     Past Surgical History:  Procedure Laterality Date  . CARDIAC CATHETERIZATION  12/19/10   DIFFUSE NONOBSTRUCTIVE CAD; NONISCHEMIC CARDIOMYOPATHY; LEFT VENTRICULAR ANGIOGRAM WAS PERFORMED SECONDARY TO  ELEVATED LEFT VENTRICULAR FILLING PRESSURES  . COLONOSCOPY W/ POLYPECTOMY    . MULTIPLE EXTRACTIONS WITH ALVEOLOPLASTY  01/27/2014   "all my teeth; 4 Quadrants of alveoloplasty  . MULTIPLE EXTRACTIONS WITH ALVEOLOPLASTY N/A 01/27/2014   Procedure: EXTRACTION OF TEETH #'1, 2, 3, 4, 5, 6, 7, 8, 9, 10, 11, 12, 13, 14, 15, 16, 17, 20, 21, 22, 23, 24, 25, 26, 27, 28, 29, 31 and 32 WITH ALVEOLOPLASTY;  Surgeon: Charlynne Pander, DDS;  Location: [facility] OR;  Service: Oral Surgery;  Laterality: N/A;  . ORIF FINGER / THUMB FRACTURE Right     Family History  Problem Relation Age of Onset  . Heart disease Mother        MI  . Heart failure Mother   . Diabetes Mother        ALSO IN MOST OF HIS SIBLINGS; 2 UNLCES HAVE ALSO PASSED AWAY FROM DM  . Cardiomyopathy Mother   . Cancer - Ovarian Mother   . Heart disease Father   . Hypertension Father   .  Diabetes Father    Social History:  reports that he has never smoked. He has never used smokeless tobacco. He reports that he does not drink alcohol or use drugs.  Allergies:  Allergies  Allergen Reactions  . Nsaids Anaphylaxis    Medications:    ROS: Denies new focal weakness. Denies confusion or difficulty with speech.   Physical Examination: Blood pressure (!) 157/87, pulse 100, temperature 99.6 F (37.6 C), temperature source Oral, resp. rate (!) 21, height 6' (1.829 m), weight 127 kg (280 lb), SpO2 98 %.  General examination: HEENT: [place]/AT Lungs: Respirations unlabored Ext: Warm and well perfused  Neurologic Examination: Mental Status: Alert, oriented, thought content appropriate.  Mild dysarthria (noted to be edentulous). Speech fluent with intact  comprehension and naming. Able to follow all commands without difficulty. Cranial Nerves: II:  Visual fields intact. PERRL.  III,IV, VI: EOMI. No nystagmus.  V,VII: Weakness of upper and lower quadrants of face on the right. Decreased temp sensation to right face.  VIII: hearing intact to voice IX,X: no hypophonia XI: Weak on the right XII: midline tongue extension Motor: RUE 4+/5 RLE 4+/5 LUE and LLE 5/5 Sensory: Decreased temp sensation to RUE. FT intact without extinction. Deep Tendon Reflexes:  1+ upper and lower extremities.  Plantars: Equivocal bilaterally Cerebellar: No ataxia with FNF bilaterally Gait: Deferred  Results for orders placed or performed during the hospital encounter of 05/08/17 (from the past 48 hour(s))  CBG monitoring, ED     Status: Abnormal   Collection Time: 05/08/17  3:11 AM  Result Value Ref Range   Glucose-Capillary 137 (H) 65 - 99 mg/dL  CBC     Status: Abnormal   Collection Time: 05/08/17  3:22 AM  Result Value Ref Range   WBC 14.0 (H) 4.0 - 10.5 K/uL   RBC 4.83 4.22 - 5.81 MIL/uL   Hemoglobin 14.4 13.0 - 17.0 g/dL   HCT 16.1 09.6 - 04.5 %   MCV 87.2 78.0 - 100.0 fL   MCH 29.8 26.0 - 34.0 pg   MCHC 34.2 30.0 - 36.0 g/dL   RDW 40.9 81.1 - 91.4 %   Platelets 180 150 - 400 K/uL  Differential     Status: Abnormal   Collection Time: 05/08/17  3:22 AM  Result Value Ref Range   Neutrophils Relative % 77 %   Neutro Abs 10.8 (H) 1.7 - 7.7 K/uL   Lymphocytes Relative 15 %   Lymphs Abs 2.1 0.7 - 4.0 K/uL   Monocytes Relative 7 %   Monocytes Absolute 0.9 0.1 - 1.0 K/uL   Eosinophils Relative 1 %   Eosinophils Absolute 0.1 0.0 - 0.7 K/uL   Basophils Relative 0 %   Basophils Absolute 0.0 0.0 - 0.1 K/uL  I-stat troponin, ED     Status: None   Collection Time: 05/08/17  3:28 AM  Result Value Ref Range   Troponin i, poc 0.02 0.00 - 0.08 ng/mL   Comment 3            Comment: Due to the release kinetics of cTnI, a negative result within the first  hours of the onset of symptoms does not rule out myocardial infarction with certainty. If myocardial infarction is still suspected, repeat the test at appropriate intervals.   I-Stat Chem 8, ED     Status: Abnormal   Collection Time: 05/08/17  3:30 AM  Result Value Ref Range   Sodium 144 135 - 145 mmol/L   Potassium 3.7 3.5 -  5.1 mmol/L   Chloride 103 101 - 111 mmol/L   BUN 29 (H) 6 - 20 mg/dL   Creatinine, Ser 1.74 (H) 0.61 - 1.24 mg/dL   Glucose, Bld 081 (H) 65 - 99 mg/dL   Calcium, Ion 4.48 1.85 - 1.40 mmol/L   TCO2 31 22 - 32 mmol/L   Hemoglobin 14.6 13.0 - 17.0 g/dL   HCT 63.1 49.7 - 02.6 %   Ct Head Code Stroke Wo Contrast  Result Date: 05/08/2017 CLINICAL DATA:  Code stroke. Right-sided facial droop and slurred speech EXAM: CT HEAD WITHOUT CONTRAST TECHNIQUE: Contiguous axial images were obtained from the base of the skull through the vertex without intravenous contrast. COMPARISON:  Head CT 03/14/2009 FINDINGS: Brain: No mass lesion or acute hemorrhage. There is focal hypoattenuation within the lateral right cerebellar hemisphere. Basal ganglia are preserved. No cerebral portable hypoattenuation. No hydrocephalus or age advanced atrophy. Vascular: No hyperdense vessel. No advanced atherosclerotic calcification of the arteries at the skull base. Skull: Normal visualized skull base, calvarium and extracranial soft tissues. Sinuses/Orbits: No sinus fluid levels or advanced mucosal thickening. No mastoid effusion. Normal orbits. ASPECTS [facility] Stroke Program Early CT Score) - Ganglionic level infarction (caudate, lentiform nuclei, internal capsule, insula, M1-M3 cortex): 7 - Supraganglionic infarction (M4-M6 cortex): 3 Total score (0-10 with 10 being normal): 10 IMPRESSION: 1. No hemorrhage or mass lesion. 2. Hypoattenuation within the right cerebellum, consistent with age indeterminate infarct. This is new compared to 06/21/2015. 3. ASPECTS is 10. Dr. Otelia Limes paged at 3:30 a.m. on  05/08/2017. Electronically Signed   By: Deatra Robinson M.D.   On: 05/08/2017 03:35    Assessment: 46 y.o. male with acute onset of diffuse weakness.  1. Called as a code stroke in the field. Described symptoms more consistent with diffuse weakness secondary to infection versus metabolic or cardiac etiology such as an arrhythmia. Symptoms also suggestive of possible near syncope. Of note, he has a history of vasovagal syncope.  2. Exam findings consistent with small old right brainstem stroke versus Bell's palsy, in conjunction with right sided weakness and sensory loss consistent with old left frontoparietal infarction. Of note, CT head shows no findings consistent with an old brainstem or left cerebral hemisphere stroke. Diffuse weakness suggestive of etiology other than stroke. Deficits on exam are attributable to prior strokes; no findings suggestive of a significant acute deficit and therefore not a tPA candidate 3. CT shows hypoattenuation within the right cerebellum, consistent with age indeterminate infarct. This is new compared to 06/21/2015.  4. Stroke Risk Factors - prior history of stroke and multiple additional risk factors as per PMHx  Plan: 1. HgbA1c, fasting lipid panel 2. MRI, MRA  of the brain without contrast 3. PT consult, OT consult, Speech consult 4. Echocardiogram 5. Carotid dopplers 6. Start ASA 81 mg po qd 7. Risk factor modification 8. Telemetry monitoring 9. Frequent neuro checks 10. Permissive HTN x 24 hours 11. Other etiologies for his diffuse weakness should also be considered, such as infection, dehydration and medication side effects.  12. Will need pharmacy to fully review and update his medication list. Patient unsure of whether or not he takes Xarelto. If he is prescribed this as an outpatient, then discontinue ASA.    @Electronically  signed: Dr. Caryl Pina 05/08/2017, 3:51 AM

## 2017-05-08 NOTE — Progress Notes (Signed)
*  PRELIMINARY RESULTS* Vascular Ultrasound Carotid Duplex (Doppler) has been completed.  Preliminary findings: Bilateral 1-39% ICA stenosis, antegrade vertebral flow.   Chauncey Fischer 05/08/2017, 2:36 PM

## 2017-05-08 NOTE — ED Notes (Signed)
CBG 180 

## 2017-05-08 NOTE — Progress Notes (Signed)
Inpatient Diabetes Program Recommendations  AACE/ADA: New Consensus Statement on Inpatient Glycemic Control (2015)  Target Ranges:  Prepandial:   less than 140 mg/dL      Peak postprandial:   less than 180 mg/dL (1-2 hours)      Critically ill patients:  140 - 180 mg/dL   Lab Results  Component Value Date   GLUCAP 164 (H) 05/08/2017   HGBA1C 12.0 (H) 05/08/2017    Review of Glycemic Control  Diabetes history: DM2 Outpatient Diabetes medications: 70/30 45 units bid Current orders for Inpatient glycemic control: Novolog 0-15 units tidwc and hs  HgbA1C of 12% indicates poor glycemic control PTA. Needs portion of basal insulin. Recommend basal-bolus insulin rather than mixed insulin in the hospital since po intake can vary. 70/30 45 units bid = 63 units long acting and   Inpatient Diabetes Program Recommendations:    Lantus 20 units bid Novolog 6 units tidwc for meal coverage insulin.  Will see when admitted. Continue to follow.  Thank you. Mitchell [name], RD, LDN, CDE Inpatient Diabetes Coordinator [phone]

## 2017-05-08 NOTE — ED Notes (Addendum)
Pt requesting that no medical information be given out over the phone to any one other than wife Gagan Mcelhone. I ensured the pt that we do not give out medical information about pt condition over the phone d/t HIPPA

## 2017-05-08 NOTE — Progress Notes (Signed)
STROKE TEAM PROGRESS NOTE   SUBJECTIVE (INTERVAL HISTORY) No family is at the bedside.  Overall he feels his condition is completely resolved. He stated that he was working at night shift and felt generalized weakness and collapsed on the desk without LOC. He was diaphretic and not feeling well. Now resolved. His EF 20-25% and plan to see his cardiologist regarding AICD placement. His A1C was high throughout these years and needs to be better managed.     OBJECTIVE Temp:  [99.6 F (37.6 C)] 99.6 F (37.6 C) (10/09 0334) Pulse Rate:  [82-101] 86 (10/09 1315) Cardiac Rhythm: Normal sinus rhythm (10/09 0322) Resp:  [9-21] 14 (10/09 1315) BP: (116-157)/(69-101) 132/72 (10/09 1315) SpO2:  [95 %-99 %] 96 % (10/09 1315) Weight:  [280 lb (127 kg)] 280 lb (127 kg) (10/09 0333)   Recent Labs Lab 05/08/17 0311 05/08/17 0828 05/08/17 1204  GLUCAP 137* 180* 164*    Recent Labs Lab 05/08/17 0322 05/08/17 0330  NA 141 144  K 3.8 3.7  CL 103 103  CO2 26  --   GLUCOSE 131* 133*  BUN 21* 29*  CREATININE 1.39* 1.40*  CALCIUM 9.6  --     Recent Labs Lab 05/08/17 0322  AST 34  ALT 14*  ALKPHOS 88  BILITOT 1.1  PROT 7.6  ALBUMIN 4.1    Recent Labs Lab 05/08/17 0322 05/08/17 0330  WBC 14.0*  --   NEUTROABS 10.8*  --   HGB 14.4 14.6  HCT 42.1 43.0  MCV 87.2  --   PLT 180  --    No results for input(s): CKTOTAL, CKMB, CKMBINDEX, TROPONINI in the last 168 hours.  Recent Labs  05/08/17 0322  LABPROT 12.9  INR 0.98    Recent Labs  05/08/17 1208  COLORURINE YELLOW  LABSPEC 1.019  PHURINE 5.0  GLUCOSEU NEGATIVE  HGBUR NEGATIVE  BILIRUBINUR NEGATIVE  KETONESUR NEGATIVE  PROTEINUR 100*  NITRITE NEGATIVE  LEUKOCYTESUR NEGATIVE       Component Value Date/Time   CHOL 211 (H) 05/08/2017 0322   TRIG 201 (H) 05/08/2017 0322   HDL 44 05/08/2017 0322   CHOLHDL 4.8 05/08/2017 0322   VLDL 40 05/08/2017 0322   LDLCALC 127 (H) 05/08/2017 0322   Lab Results   Component Value Date   HGBA1C 12.0 (H) 05/08/2017      Component Value Date/Time   LABOPIA NONE DETECTED 05/08/2017 1208   COCAINSCRNUR NONE DETECTED 05/08/2017 1208   LABBENZ NONE DETECTED 05/08/2017 1208   AMPHETMU NONE DETECTED 05/08/2017 1208   THCU NONE DETECTED 05/08/2017 1208   LABBARB NONE DETECTED 05/08/2017 1208    No results for input(s): ETH in the last 168 hours.  I have personally reviewed the radiological images below and agree with the radiology interpretations.  Dg Chest 2 View 05/08/2017  IMPRESSION: Mild cardiomegaly and low lung volumes.  No acute findings.   Mri and Mra Head Wo Contrast 05/08/2017 IMPRESSION: No acute or subacute brain insult. Old right inferior cerebellar infarction. Minimal T2 signal in the pons suggesting mild small vessel change. No other brain abnormality. Notably, there is an absence of small-vessel changes of the cerebral hemispheres. Markedly abnormal intracranial MR angiogram. Given the absence of obvious atherosclerotic disease on a chest CT of November 2017, this raises the question of other types of vasculopathy. Moderate stenosis in the left carotid siphon. Narrowing and irregularity of the M1 segments bilaterally, possibly severe on the left. Diminutive left A1 segment which could be acquired or  congenital. Bilateral vertebral stenoses, left worse than right. Narrowing and irregularity of the proximal right PCA. Possible superior cerebellar artery stenoses. Electronically Signed   By: Paulina Fusi M.D.   On:   Ct Head Code Stroke Wo Contrast 05/08/2017   IMPRESSION: 1. No hemorrhage or mass lesion. 2. Hypoattenuation within the right cerebellum, consistent with age indeterminate infarct. This is new compared to 06/21/2015. 3. ASPECTS is 10.   Carotid Doppler  Bilateral 1-39% ICA stenosis, antegrade vertebral flow.  2D Echocardiogram   - Left ventricle: The cavity size was severely dilated. There was   moderate concentric hypertrophy.  Systolic function was severely   reduced. The estimated ejection fraction was in the range of 20%   to 25%. Wall motion was normal; there were no regional wall   motion abnormalities. Features are consistent with a pseudonormal   left ventricular filling pattern, with concomitant abnormal   relaxation and increased filling pressure (grade 2 diastolic   dysfunction). - Left atrium: The atrium was mildly dilated. - Pulmonic valve: There was trivial regurgitation. - Pericardium, extracardiac: A small, free-flowing pericardial   effusion was identified along the right ventricular free wall.   The fluid had no internal echoes.   PHYSICAL EXAM  Temp:  [99.6 F (37.6 C)] 99.6 F (37.6 C) (10/09 0334) Pulse Rate:  [82-101] 86 (10/09 1315) Resp:  [9-21] 14 (10/09 1315) BP: (116-157)/(69-101) 132/72 (10/09 1315) SpO2:  [95 %-99 %] 96 % (10/09 1315) Weight:  [280 lb (127 kg)] 280 lb (127 kg) (10/09 0333)  General - Well nourished, well developed, in no apparent distress.  Ophthalmologic - Sharp disc margins OU.  Cardiovascular - Regular rate and rhythm with no murmur.  Neck - supple, no carotid bruits  Mental Status -  Level of arousal and orientation to time, place, and person were intact. Language including expression, naming, repetition, comprehension was assessed and found intact. Attention span and concentration were normal. Fund of Knowledge was assessed and was intact.  Cranial Nerves II - XII - II - Visual field intact OU. III, IV, VI - Extraocular movements intact. V - Facial sensation intact bilaterally. VII - chronic right facial droop. VIII - Hearing & vestibular intact bilaterally. X - Palate elevates symmetrically. XI - Chin turning & shoulder shrug intact bilaterally. XII - Tongue protrusion intact.  Motor Strength - The patient's strength was normal in all extremities and pronator drift was absent.  Bulk was normal and fasciculations were absent.   Motor Tone -  Muscle tone was assessed at the neck and appendages and was normal.  Reflexes - The patient's reflexes were symmetrical in all extremities and he had no pathological reflexes.  Sensory - Light touch, temperature/pinprick were assessed and were symmetrical.    Coordination - The patient had normal movements in the hands and feet with no ataxia or dysmetria.  Tremor was absent.  Gait and Station - The patient's transfers, posture, gait, station, and turns were observed as normal.   ASSESSMENT/PLAN Mitchell [name] is a 46 y.o. male with history of stroke, DM, HTN, HLD, CHF, CAD/MI, OSA admitted for generalized weakness, not feeling well.    Near syncope - likely due to AKI in the setting of cardiomyopathy EF 20-25%. He does have multifocal intracranial stenosis in the setting of risk factors for stroke including uncontrolled DM, HTN, HLD, obesity, CHF, CAD/MI, hx of stroke and OSA.   Resultant - back to baseline  MRI  No acute infarct, chronic right  cerebellar infarct  MRA  Multifocal intracranial stenosis including left carotid siphon, M1 segments bilaterally, b/l VA, right PCA and b/l SCAs.  Carotid Doppler unremarkable  2D Echo  EF 20-25% unchanged  LDL 127  HgbA1c 12.0  UDS negative  lovenox for VTE prophylaxis  Diet heart healthy/carb modified Room service appropriate? Yes; Fluid consistency: Thin   No antithrombotic prior to admission, now on clopidogrel 75 mg daily. Pt has NSAIDs allergy but not ASA. Will put on ASA  on top of plavix for stroke and cardiac prevention.  Patient counseled to be compliant with his antithrombotic medications  Ongoing aggressive stroke risk factor management  Therapy recommendations:  pending  Disposition:  Pending  Cardiomyopathy / CHF  EF 20-25% unchanged  Hx of CAD/MI  Follows with cardiology  Agree with AICD evaluation by cardiology as planned. If AICD/pacemaker not indicated, recommend loop recorder to rule out  afib.  AKI  Cre 1.40 from baseline 0.9-1.0  On IVF  This am Cre normalized  Encourage po intake  Avoid dehydration  Intracranial stenosis  MRA Multifocal intracranial stenosis including left carotid siphon, M1 segments bilaterally, b/l VA, right PCA and b/l SCAs.  Likely due to long standing uncontrolled DM, HTN, HLD, OSA, obesity  Recommend DAPT with ASA and plavix    Diabetes  HgbA1c 12.0 goal < 7.0  Uncontrolled for years   CBG monitoring  SSI  On insulin at home  DM education and close PCP follow up  Hypertension  Home meds:   Entresto  Permissive hypertension (OK if <180/105) for 24-48 hours post stroke and then gradually normalized within 5-7 days. Currently on lasix, entresto  Stable  BP long term goal 120-150 due to intracranial stenosis  Hyperlipidemia  Home meds:  lipitor 40  LDL 127, goal < 70  Increase lipitor to 80  Continue statin at discharge  Other Stroke Risk Factors  Obesity, Body mass index is 37.97 kg/m.   Hx stroke/TIA - old right cerebellar infarct on CT/MRI - chronic right facial droop  CAD/MI  Obstructive sleep apnea  CHF on lasix and entresto  Other Active Problems  Leukocytosis WBC 14.0  Bipolar disorder  Hospital day # 0  Neurology will sign off. Please call with questions. Pt will follow up with Dr. Roda Shutters at [facility] in about 6 weeks. Thanks for the consult.   Marvel Plan, MD PhD Stroke Neurology 05/08/2017 1:33 PM    To contact Stroke Continuity provider, please refer to [email or web address]. After hours, contact General Neurology

## 2017-05-08 NOTE — ED Notes (Signed)
Pt aware that urine sample is needed.  

## 2017-05-08 NOTE — ED Notes (Signed)
Pt states that he wants his IVs out. Pt informed of the need to have a large bore IV in his [facility] or higher because of his MRA. Pt agreed to keep his LAC IV in but asked this nurse to take out the one in his RAC

## 2017-05-08 NOTE — ED Provider Notes (Signed)
[facility]-EMERGENCY DEPT Provider Note   CSN: [id number] Arrival date & time: 05/08/17  0308     History   Chief Complaint Chief Complaint  Patient presents with  . Code Stroke    HPI Mitchell [name] is a 46 y.o. male.  Patient presents to the emergency department for possible stroke. Patient was at [facility] he had onset of feeling generalized weakness. Patient reports that he sat down and became even more week. Coworkers found him slumped over at the table. He does not think he passed out. Coworkers report that he seemed to have right-sided facial droop and slurred speech, called EMS. EMS did find these neurologic findings and activated code stroke. At arrival to the ER, patient reports that he still feels generalized weakness. He does have a history of previous stroke, reports that he does have some left-sided weakness secondary to this. He is unsure if his weakness is worse.      Past Medical History:  Diagnosis Date  . Bipolar disorder (HCC)   . CHF (congestive heart failure) (HCC)   . Chronic systolic heart failure (HCC) 11/2010   a. NICM 4/14 EF 20-25%, TR mild  . CVA (cerebral infarction)    No residual deficits  . Depression    PTSD,   . Diabetes mellitus    TYPE II; UNCONTROLLED BY HEMOGLOBIN A1c; STABLE AS  PER DISCHARGE  . Headache(784.0)   . 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 History of colonic polyps   . Hyperlipidemia   . Hypertension   . Myocardial infarction [facility])    at age 37 years old (while playing football)  . Non-ischemic cardiomyopathy (HCC)    No ischemia on myoview, showed EF of 42%. 2D echo showed EF 50-55% with diatolic dysfunction in 2009  . Obesity   . OSA (obstructive sleep apnea)    Being set up again for c-pap  . Pneumonia   . Post-cardiac injury syndrome (HCC)    History of cardiac injury from blunt trauma  . PVCs (premature ventricular contractions)   . Schizophrenia (HCC)    Goes to [facility]  . SOB (shortness of  breath)   . Stroke [facility]) 2005   some left side weakness  . Syncope    Recurrent, thought to be vasovagal. Also has h/o frequent PVCs.     Patient Active Problem List   Diagnosis Date Noted  . Acute combined systolic and diastolic congestive heart failure (HCC)   . Noncompliance 01/22/2014  . Acute respiratory failure with hypoxia (HCC) 11/25/2013  . Chronic systolic heart failure (HCC) 10/02/2011  . Erectile dysfunction 10/02/2011  . Acute on chronic systolic heart failure (HCC) 09/23/2011  . Obstructive sleep apnea 10/11/2007  . Type II diabetes mellitus, uncontrolled 10/10/2007  . HLD (hyperlipidemia) 10/10/2007  . HYPOKALEMIA 10/10/2007  . OBESITY 10/10/2007  . Essential hypertension 10/10/2007  . PREMATURE VENTRICULAR CONTRACTIONS 10/10/2007  . SYNCOPE 10/10/2007  . COLONIC POLYPS, HX OF 10/10/2007    Past Surgical History:  Procedure Laterality Date  . CARDIAC CATHETERIZATION  12/19/10   DIFFUSE NONOBSTRUCTIVE CAD; NONISCHEMIC CARDIOMYOPATHY; LEFT VENTRICULAR ANGIOGRAM WAS PERFORMED SECONDARY TO  ELEVATED LEFT VENTRICULAR FILLING PRESSURES  . COLONOSCOPY W/ POLYPECTOMY    . MULTIPLE EXTRACTIONS WITH ALVEOLOPLASTY  01/27/2014   "all my teeth; 4 Quadrants of alveoloplasty  . MULTIPLE EXTRACTIONS WITH ALVEOLOPLASTY N/A 01/27/2014   Procedure: EXTRACTION OF TEETH #'1, 2, 3, 4, 5, 6, 7, 8, 9, 10, 11, 12, 13, 14, 15, 16,  17, 20, 21, 22, 23, 24, 25, 26, 27, 28, 29, 31 and 32 WITH ALVEOLOPLASTY;  Surgeon: Charlynne Pander, DDS;  Location: [facility] OR;  Service: Oral Surgery;  Laterality: N/A;  . ORIF FINGER / THUMB FRACTURE Right        Home Medications    Prior to Admission medications   Medication Sig Start Date End Date Taking? Authorizing Provider  atorvastatin (LIPITOR) 40 MG tablet Take 1 tablet (40 mg total) by mouth at bedtime. 07/13/16   Graciella Freer, PA-C  carvedilol (COREG) 6.25 MG tablet Take 1 tablet (6.25 mg total) by mouth 2 (two) times daily with a meal.  10/02/16   [name], Gwenlyn Found, MD  cetirizine (ZYRTEC ALLERGY) 10 MG tablet Take 1 tablet (10 mg total) by mouth daily. 12/26/16   Luevenia Maxin, Mina A, PA-C  digoxin (LANOXIN) 0.125 MG tablet Take 1 tablet (0.125 mg total) by mouth daily. 10/02/16   [name], Gwenlyn Found, MD  doxycycline (VIBRAMYCIN) 100 MG capsule Take 1 capsule (100 mg total) by mouth 2 (two) times daily. One po bid x 7 days 03/18/17   Palumbo, April, MD  fluticasone [facility]) 50 MCG/ACT nasal spray Place 2 sprays into both nostrils daily. 12/26/16   Fawze, Mina A, PA-C  furosemide (LASIX) 40 MG tablet Take 1.5 tablets (60 mg total) by mouth daily. 10/02/16   [name], Gwenlyn Found, MD  glucose blood test strip Pt uses one touch.  Test BID 11/02/16   [name], Gwenlyn Found, MD  insulin NPH-regular Human (NOVOLIN 70/30) (70-30) 100 UNIT/ML injection Inject 45 Units into the skin 2 (two) times daily. 03/13/17   [name], Gwenlyn Found, MD  Lancets 28G MISC Use for glucose testing up to BID 11/02/16   [name], Gwenlyn Found, MD  Potassium Chloride ER 20 MEQ TBCR Take 20 mEq by mouth daily. 10/02/16   [name], Gwenlyn Found, MD  QUEtiapine (SEROQUEL) 100 MG tablet Take 1.5 tablets (150 mg total) by mouth at bedtime. 12/08/15   Arrien, York Ram, MD  sacubitril-valsartan (ENTRESTO) 49-51 MG Take 1 tablet by mouth 2 (two) times daily. 07/03/16   Sherald Hess, NP    Family History Family History  Problem Relation Age of Onset  . Heart disease Mother        MI  . Heart failure Mother   . Diabetes Mother        ALSO IN MOST OF HIS SIBLINGS; 2 UNLCES HAVE ALSO PASSED AWAY FROM DM  . Cardiomyopathy Mother   . Cancer - Ovarian Mother   . Heart disease Father   . Hypertension Father   . Diabetes Father     Social History Social History  Substance Use Topics  . Smoking status: Never Smoker  . Smokeless tobacco: Never Used  . Alcohol use No     Allergies   Nsaids   Review of Systems Review of Systems  Neurological: Positive for weakness. Negative for  headaches.  All other systems reviewed and are negative.    Physical Exam Updated Vital Signs BP 137/85   Pulse 94   Temp 99.6 F (37.6 C) (Oral)   Resp (!) 9   Ht 6' (1.829 m)   Wt 127 kg (280 lb)   SpO2 98%   BMI 37.97 kg/m   Physical Exam  Constitutional: He is oriented to person, place, and time. He appears well-developed and well-nourished. No distress.  HENT:  Head: Normocephalic and atraumatic.  Right Ear: Hearing normal.  Left Ear: Hearing normal.  Nose: Nose normal.  Mouth/Throat: Oropharynx is clear and moist and mucous membranes are normal.  Eyes: Pupils are equal, round, and reactive to light. Conjunctivae and EOM are normal.  Neck: Normal range of motion. Neck supple.  Cardiovascular: Regular rhythm, S1 normal and S2 normal.  Exam reveals no gallop and no friction rub.   No murmur heard. Pulmonary/Chest: Effort normal and breath sounds normal. No respiratory distress. He exhibits no tenderness.  Abdominal: Soft. Normal appearance and bowel sounds are normal. There is no hepatosplenomegaly. There is no tenderness. There is no rebound, no guarding, no tenderness at McBurney's point and negative Murphy's sign. No hernia.  Musculoskeletal: He exhibits no deformity.  Neurological: He is alert and oriented to person, place, and time. He has normal strength. A cranial nerve deficit is present. No sensory deficit. Coordination normal. GCS eye subscore is 4. GCS verbal subscore is 5. GCS motor subscore is 6.  Slight weakness and decreased sensation left upper and left lower extremity  Right facial droop +/- upper motor neuron involvement ?bells  Skin: Skin is warm, dry and intact. No rash noted. No cyanosis.  Psychiatric: He has a normal mood and affect. His speech is normal and behavior is normal. Thought content normal.  Nursing note and vitals reviewed.    ED Treatments / Results  Labs (all labs ordered are listed, but only abnormal results are displayed) Labs  Reviewed  CBC - Abnormal; Notable for the following:       Result Value   WBC 14.0 (*)    All other components within normal limits  DIFFERENTIAL - Abnormal; Notable for the following:    Neutro Abs 10.8 (*)    All other components within normal limits  COMPREHENSIVE METABOLIC PANEL - Abnormal; Notable for the following:    Glucose, Bld 131 (*)    BUN 21 (*)    Creatinine, Ser 1.39 (*)    ALT 14 (*)    GFR calc non Af Amer 59 (*)    All other components within normal limits  CBG MONITORING, ED - Abnormal; Notable for the following:    Glucose-Capillary 137 (*)    All other components within normal limits  I-STAT CHEM 8, ED - Abnormal; Notable for the following:    BUN 29 (*)    Creatinine, Ser 1.40 (*)    Glucose, Bld 133 (*)    All other components within normal limits  PROTIME-INR  APTT  I-STAT TROPONIN, ED    EKG  EKG Interpretation  Date/Time:  Tuesday May 08 2017 03:31:02 EDT Ventricular Rate:  101 PR Interval:    QRS Duration: 118 QT Interval:  395 QTC Calculation: 512 R Axis:   -58 Text Interpretation:  Sinus tachycardia Multiple ventricular premature complexes LAE, consider biatrial enlargement Incomplete RBBB and LAFB Left ventricular hypertrophy Anterior Q waves, possibly due to LVH No significant change since last tracing Confirmed by Gilda Crease [id number]) on 05/08/2017 3:56:04 AM       Radiology Ct Head Code Stroke Wo Contrast  Addendum Date: 05/08/2017   ADDENDUM REPORT: 05/08/2017 03:56 ADDENDUM: These results were called by telephone at the time of interpretation on 05/08/2017 at 3:50 am to Dr. Caryl Pina , who verbally acknowledged these results. Electronically Signed   By: Deatra Robinson M.D.   On: 05/08/2017 03:56   Result Date: 05/08/2017 CLINICAL DATA:  Code stroke. Right-sided facial droop and slurred speech EXAM: CT HEAD WITHOUT CONTRAST TECHNIQUE: Contiguous axial images were obtained from the  base of the skull through the vertex  without intravenous contrast. COMPARISON:  Head CT 03/14/2009 FINDINGS: Brain: No mass lesion or acute hemorrhage. There is focal hypoattenuation within the lateral right cerebellar hemisphere. Basal ganglia are preserved. No cerebral portable hypoattenuation. No hydrocephalus or age advanced atrophy. Vascular: No hyperdense vessel. No advanced atherosclerotic calcification of the arteries at the skull base. Skull: Normal visualized skull base, calvarium and extracranial soft tissues. Sinuses/Orbits: No sinus fluid levels or advanced mucosal thickening. No mastoid effusion. Normal orbits. ASPECTS [facility] Stroke Program Early CT Score) - Ganglionic level infarction (caudate, lentiform nuclei, internal capsule, insula, M1-M3 cortex): 7 - Supraganglionic infarction (M4-M6 cortex): 3 Total score (0-10 with 10 being normal): 10 IMPRESSION: 1. No hemorrhage or mass lesion. 2. Hypoattenuation within the right cerebellum, consistent with age indeterminate infarct. This is new compared to 06/21/2015. 3. ASPECTS is 10. Dr. Otelia Limes paged at 3:30 a.m. on 05/08/2017. Electronically Signed: By: Deatra Robinson M.D. On: 05/08/2017 03:35    Procedures Procedures (including critical care time)  Medications Ordered in ED Medications - No data to display   Initial Impression / Assessment and Plan / ED Course  I have reviewed the triage vital signs and the nursing notes.  Pertinent labs & imaging results that were available during my care of the patient were reviewed by me and considered in my medical decision making (see chart for details).     Patient presents to the emergency department for possible stroke. Patient does have a history of previous stroke and appears to have a history of mild left hemiparesis secondary to the previous stroke. Patient had onset of generalized weakness earlier. No loss of consciousness or syncope. Patient was noted to be having difficulty with speech and facial drooping. This appears to  be new for him. He does have right facial droop. In the ER, but there appears to be some difficulty with opening and closing his eye as well, possibly not CNS in origin. Patient is a very poor historian, is not sure what his baseline is. He does have some weakness of the left leg. He does, however, intermittently have what appears to be an expressive aphasia. During speech intermittently has inability to get words/find words.Cannot rule out new stroke. Initial CT negative. Will admit for further workup.  Final Clinical Impressions(s) / ED Diagnoses   Final diagnoses:  Aphasia  Generalized weakness    New Prescriptions New Prescriptions   No medications on file     Gilda Crease, MD 05/08/17 [phone]

## 2017-05-08 NOTE — H&P (Signed)
History and Physical    Mitchell [name] JXB:[id number] DOB: May 13, 1971 DOA: 05/08/2017  PCP: Mitchell Cables, MD Patient coming from: home  Chief Complaint: generalized weakness/slutted speech  HPI: Mitchell [name] is a 46 y.o. male with medical history significant past medical history that includes diabetes, hypertension, obesity, stroke 2005 with some right-sided left weakness, schizophrenia, obstructive sleep apnea, nonischemic cardiomyopathy, hyperlipidemia chronic systolic heart failure reports emergency Department chief complaint of generalized weakness and slurred speech. [facility] asked to admit for stroke workup  Information is obtained from the patient and the chart. Patient reports he was his usual state of health until he was at work last night he began to feel "weak all over" he continued to try and work but became even more week. He sat down and coworkers report they found him slumped over but conscious. They also report he seemed to have right-sided facial droop and slurred speech. EMS was called and reported the same findings and activated a code stroke. He was transported to the ER and evaluated by neurology. He does have a history of a previous stroke port some right-sided weakness. He denies dizziness headache visual disturbances numbness or tingling of extremities. He denies chest pain palpitation shortness of breath lower extremity edema or orthopnea. He denies abdominal pain nausea vomiting diarrhea constipation melena or bright red blood per rectum. He denies dysuria hematuria frequency or urgency. He reports he does not check his blood sugar regularly.    ED Course: In the emergency department is afebrile hemodynamically stable and nontoxic appearing.  Review of Systems: As per HPI otherwise all other systems reviewed and are negative.   Ambulatory Status: Ambulates independently and is independent with ADLs.  Past Medical History:  Diagnosis Date  . Bipolar  disorder (HCC)   . CHF (congestive heart failure) (HCC)   . Chronic systolic heart failure (HCC) 11/2010   a. NICM 4/14 EF 20-25%, TR mild  . CVA (cerebral infarction)    No residual deficits  . Depression    PTSD,   . Diabetes mellitus    TYPE II; UNCONTROLLED BY HEMOGLOBIN A1c; STABLE AS  PER DISCHARGE  . Headache(784.0)   . 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 History of colonic polyps   . Hyperlipidemia   . Hypertension   . Myocardial infarction [facility])    at age 69 years old (while playing football)  . Non-ischemic cardiomyopathy (HCC)    No ischemia on myoview, showed EF of 42%. 2D echo showed EF 50-55% with diatolic dysfunction in 2009  . Obesity   . OSA (obstructive sleep apnea)    Being set up again for c-pap  . Pneumonia   . Post-cardiac injury syndrome (HCC)    History of cardiac injury from blunt trauma  . PVCs (premature ventricular contractions)   . Schizophrenia (HCC)    Goes to [facility]  . SOB (shortness of breath)   . Stroke [facility]) 2005   some left side weakness  . Syncope    Recurrent, thought to be vasovagal. Also has h/o frequent PVCs.     Past Surgical History:  Procedure Laterality Date  . CARDIAC CATHETERIZATION  12/19/10   DIFFUSE NONOBSTRUCTIVE CAD; NONISCHEMIC CARDIOMYOPATHY; LEFT VENTRICULAR ANGIOGRAM WAS PERFORMED SECONDARY TO  ELEVATED LEFT VENTRICULAR FILLING PRESSURES  . COLONOSCOPY W/ POLYPECTOMY    . MULTIPLE EXTRACTIONS WITH ALVEOLOPLASTY  01/27/2014   "all my teeth; 4 Quadrants of alveoloplasty  . MULTIPLE EXTRACTIONS WITH ALVEOLOPLASTY N/A 01/27/2014  Procedure: EXTRACTION OF TEETH #'1, 2, 3, 4, 5, 6, 7, 8, 9, 10, 11, 12, 13, 14, 15, 16, 17, 20, 21, 22, 23, 24, 25, 26, 27, 28, 29, 31 and 32 WITH ALVEOLOPLASTY;  Surgeon: Charlynne Pander, DDS;  Location: [facility] OR;  Service: Oral Surgery;  Laterality: N/A;  . ORIF FINGER / THUMB FRACTURE Right     Social History   Social History  . Marital status: Married    Spouse name:  N/A  . Number of children: 5  . Years of education: N/A   Occupational History  . Mortician    Social History Main Topics  . Smoking status: Never Smoker  . Smokeless tobacco: Never Used  . Alcohol use No  . Drug use: No  . Sexual activity: Not on file   Other Topics Concern  . Not on file   Social History Narrative   MARRIED, LIVES IN [place] WITH WIFE; GREW UP IN [place] AND USED TO BE A COOK; HE ENJOYS COOKING AND ENJOYS EATING A LOT OF PORK AND SALT.    Allergies  Allergen Reactions  . Nsaids Anaphylaxis    Family History  Problem Relation Age of Onset  . Heart disease Mother        MI  . Heart failure Mother   . Diabetes Mother        ALSO IN MOST OF HIS SIBLINGS; 2 UNLCES HAVE ALSO PASSED AWAY FROM DM  . Cardiomyopathy Mother   . Cancer - Ovarian Mother   . Heart disease Father   . Hypertension Father   . Diabetes Father     Prior to Admission medications   Medication Sig Start Date End Date Taking? Authorizing Provider  atorvastatin (LIPITOR) 40 MG tablet Take 1 tablet (40 mg total) by mouth at bedtime. 07/13/16  Yes Graciella Freer, PA-C  carvedilol (COREG) 6.25 MG tablet Take 1 tablet (6.25 mg total) by mouth 2 (two) times daily with a meal. 10/02/16  Yes [name], Gwenlyn Found, MD  cetirizine (ZYRTEC ALLERGY) 10 MG tablet Take 1 tablet (10 mg total) by mouth daily. 12/26/16  Yes Fawze, Mina A, PA-C  digoxin (LANOXIN) 0.125 MG tablet Take 1 tablet (0.125 mg total) by mouth daily. 10/02/16  Yes [name], Gwenlyn Found, MD  fluticasone (FLONASE) 50 MCG/ACT nasal spray Place 2 sprays into both nostrils daily. 12/26/16  Yes Fawze, Mina A, PA-C  furosemide (LASIX) 40 MG tablet Take 1.5 tablets (60 mg total) by mouth daily. 10/02/16  Yes [name], Gwenlyn Found, MD  insulin NPH-regular Human (NOVOLIN 70/30) (70-30) 100 UNIT/ML injection Inject 45 Units into the skin 2 (two) times daily. 03/13/17  Yes [name], Gwenlyn Found, MD  Potassium Chloride ER 20 MEQ TBCR Take 20 mEq  by mouth daily. 10/02/16  Yes [name], Gwenlyn Found, MD  sacubitril-valsartan (ENTRESTO) 49-51 MG Take 1 tablet by mouth 2 (two) times daily. 07/03/16  Yes Clegg, Amy D, NP  glucose blood test strip Pt uses one touch.  Test BID 11/02/16   [name], Gwenlyn Found, MD  Lancets 28G MISC Use for glucose testing up to BID 11/02/16   [name], Gwenlyn Found, MD    Physical Exam: Vitals:   05/08/17 0500 05/08/17 0600 05/08/17 0615 05/08/17 0630  BP: (!) 149/83 (!) 145/94 (!) 145/76 (!) 133/95  Pulse: 93 93 93 92  Resp: 16 12 (!) 21 18  Temp:      TempSrc:      SpO2: 98% 98% 97% 97%  Weight:  Height:         General:  Appears calm some what lethargic Eyes:  PERRL, EOMI, normal lids, iris ENT:  grossly normal hearing, lips & tongue, mucous membranes of his mouth are moist and pink. Poor dentition Neck:  no LAD, masses or thyromegaly Cardiovascular:  RRR, no m/r/g. No LE edema.  Respiratory:  CTA bilaterally, no w/r/r. Normal respiratory effort. Abdomen:  soft, ntnd, positive bowel sounds throughout no guarding or rebounding Skin:  no rash or induration seen on limited exam Musculoskeletal:  grossly normal tone BUE/BLE, good ROM, no bony abnormality Psychiatric:  grossly normal mood and affect, speech fluent and appropriate, AOx3 Neurologic:  Somewhat lethargic. Arouses to verbal stimuli. Falls asleep easily. Left grip 5 out of 5 right grip 4 out of 5 bilateral lower extremity strength 5 out of 5. speech slightly slurred tongue midline left facial droop oriented 3 follows commands  Labs on Admission: I have personally reviewed following labs and imaging studies  CBC:  Recent Labs Lab 05/08/17 0322 05/08/17 0330  WBC 14.0*  --   NEUTROABS 10.8*  --   HGB 14.4 14.6  HCT 42.1 43.0  MCV 87.2  --   PLT 180  --    Basic Metabolic Panel:  Recent Labs Lab 05/08/17 0322 05/08/17 0330  NA 141 144  K 3.8 3.7  CL 103 103  CO2 26  --   GLUCOSE 131* 133*  BUN 21* 29*  CREATININE 1.39* 1.40*    CALCIUM 9.6  --    GFR: Estimated Creatinine Clearance: 88.9 mL/min (A) (by C-G formula based on SCr of 1.4 mg/dL (H)). Liver Function Tests:  Recent Labs Lab 05/08/17 0322  AST 34  ALT 14*  ALKPHOS 88  BILITOT 1.1  PROT 7.6  ALBUMIN 4.1   No results for input(s): LIPASE, AMYLASE in the last 168 hours. No results for input(s): AMMONIA in the last 168 hours. Coagulation Profile:  Recent Labs Lab 05/08/17 0322  INR 0.98   Cardiac Enzymes: No results for input(s): CKTOTAL, CKMB, CKMBINDEX, TROPONINI in the last 168 hours. BNP (last 3 results) No results for input(s): PROBNP in the last 8760 hours. HbA1C: No results for input(s): HGBA1C in the last 72 hours. CBG:  Recent Labs Lab 05/08/17 0311  GLUCAP 137*   Lipid Profile: No results for input(s): CHOL, HDL, LDLCALC, TRIG, CHOLHDL, LDLDIRECT in the last 72 hours. Thyroid Function Tests: No results for input(s): TSH, T4TOTAL, FREET4, T3FREE, THYROIDAB in the last 72 hours. Anemia Panel: No results for input(s): VITAMINB12, FOLATE, FERRITIN, TIBC, IRON, RETICCTPCT in the last 72 hours. Urine analysis:    Component Value Date/Time   COLORURINE YELLOW 12/08/2014 2355   APPEARANCEUR CLOUDY (A) 12/08/2014 2355   LABSPEC 1.040 (H) 12/08/2014 2355   PHURINE 5.0 12/08/2014 2355   GLUCOSEU >1000 (A) 12/08/2014 2355   HGBUR TRACE (A) 12/08/2014 2355   BILIRUBINUR NEGATIVE 12/08/2014 2355   KETONESUR NEGATIVE 12/08/2014 2355   PROTEINUR NEGATIVE 12/08/2014 2355   UROBILINOGEN 0.2 12/08/2014 2355   NITRITE POSITIVE (A) 12/08/2014 2355   LEUKOCYTESUR TRACE (A) 12/08/2014 2355    Creatinine Clearance: Estimated Creatinine Clearance: 88.9 mL/min (A) (by C-G formula based on SCr of 1.4 mg/dL (H)).  Sepsis Labs: (procalcitonin:4,lacticidven:4) )No results found for this or any previous visit (from the past 240 hour(s)).   Radiological Exams on Admission: Ct Head Code Stroke Wo Contrast  Addendum Date:  05/08/2017   ADDENDUM REPORT: 05/08/2017 03:56 ADDENDUM: These results were called by  telephone at the time of interpretation on 05/08/2017 at 3:50 am to Dr. Caryl Pina , who verbally acknowledged these results. Electronically Signed   By: Deatra Robinson M.D.   On: 05/08/2017 03:56   Result Date: 05/08/2017 CLINICAL DATA:  Code stroke. Right-sided facial droop and slurred speech EXAM: CT HEAD WITHOUT CONTRAST TECHNIQUE: Contiguous axial images were obtained from the base of the skull through the vertex without intravenous contrast. COMPARISON:  Head CT 03/14/2009 FINDINGS: Brain: No mass lesion or acute hemorrhage. There is focal hypoattenuation within the lateral right cerebellar hemisphere. Basal ganglia are preserved. No cerebral portable hypoattenuation. No hydrocephalus or age advanced atrophy. Vascular: No hyperdense vessel. No advanced atherosclerotic calcification of the arteries at the skull base. Skull: Normal visualized skull base, calvarium and extracranial soft tissues. Sinuses/Orbits: No sinus fluid levels or advanced mucosal thickening. No mastoid effusion. Normal orbits. ASPECTS [facility] Stroke Program Early CT Score) - Ganglionic level infarction (caudate, lentiform nuclei, internal capsule, insula, M1-M3 cortex): 7 - Supraganglionic infarction (M4-M6 cortex): 3 Total score (0-10 with 10 being normal): 10 IMPRESSION: 1. No hemorrhage or mass lesion. 2. Hypoattenuation within the right cerebellum, consistent with age indeterminate infarct. This is new compared to 06/21/2015. 3. ASPECTS is 10. Dr. Otelia Limes paged at 3:30 a.m. on 05/08/2017. Electronically Signed: By: Deatra Robinson M.D. On: 05/08/2017 03:35    EKG: Independently reviewed. Sinus tachycardia Multiple ventricular premature complexes LAE, consider biatrial enlargement Incomplete RBBB and LAFB Left ventricular hypertrophy Anterior Q waves, possibly due to LVH No significant change since last tracing  Assessment/Plan Principal  Problem:   Slurred speech Active Problems:   Diabetes (HCC)   HLD (hyperlipidemia)   OBESITY   Obstructive sleep apnea   Essential hypertension   Chronic systolic heart failure (HCC)   Weakness   Acute kidney injury (HCC)   Leukocytosis   #1. Slurred speech/generalized weakness. History of stroke 3 with some right-sided weakness. Chart review indicates last seen normal 12:30 midnight. He was evaluated by neurology who opined symptoms more consistent with diffuse weakness secondary to infection versus metabolic or cardiac etiology such as arrhythmia or near-syncope. Chart review does indicate he has a history of vasovagal syncope. Neurology opines exam consistent with small old right brain stem stroke versus Bell's palsy in conjunction with right-sided weakness consistent with old left frontoparietal infarct. CT today shows no findings consistent with an old brainstem or left cerebral hemisphere stroke and age indeterminate infarct. Neuro opines deficits on exam her to be don't 2 prior strokes and diffuse weakness suggestive of etiology other than a stroke recommend admission for complete stroke workup. Risk factors include prior history of stroke diabetes obesity hyperlipidemia hypertension noncompliance -Admit to telemetry -MRI MRA -2-D echo and carotid Dopplers -Hemoglobin A1c lipid panel -He has passed a bedside swallow eval so will provide heart healthy carb modified diet -PT and OT consult -Aspirin and statin  #2. Acute kidney injury. Vitamin 1.4 on admission. Chart review indicates creatinine within the limits of normal 4 months ago. -Gentle IV fluids -Hold nephrotoxins -Monitor urine output -Recheck in the morning  #3. Leukocytosis. Mild. Patient is afebrile hemodynamically stable and nontoxic appearing. May be reactive. -Chain a urinalysis -Follow chest x-ray -frequent vital signs -Recheck in the morning  #4. Chronic systolic heart failure. Patient appears compensated. Echo  2017 revealed an EF of 20% mild LVH. Home meds include Coreg, digoxin, Lasix, Entresto -Hold Coreg for now -Daily weights -Monitor intake and output -Follow 2-D echo  #5. Hypertension. Fair control  in the emergency department -Hold Coreg for now -Continue home meds -Monitor  #6. Hyperlipidemia. -Continue statin  #7. Obstructive sleep apnea. Patient reports noncompliance    DVT prophylaxis: lovenox  Code Status: full  Family Communication: none present  Disposition Plan: home hopefully 24 hours  Consults called:  [place] neuro Admission status: obs   Toya Smothers M MD [facility]  If 7PM-7AM, please contact night-coverage [email or web address] Password [email or web address]  05/08/2017, 6:46 AM

## 2017-05-08 NOTE — Progress Notes (Signed)
PT Cancellation Note  Patient Details Name: Mitchell [name] MRN: [id number] DOB: April 05, 1971   Cancelled Treatment:    Reason Eval/Treat Not Completed: Other (comment) Pt currently eating and would like to finish. Will check back as schedule allows.   Mitchell [name], PT, DPT  Acute Rehabilitation Services  Pager: [phone]    Lehman Prom 05/08/2017, 12:30 PM

## 2017-05-08 NOTE — Code Documentation (Signed)
Responded to Code stroke called at 0255.  Pt arrived at 0307.  Per patient, he was at work and began to not feel well.  On EMS arrival, pt slumped over a table, cbg-149, BP 149/100, patient with R facial droop and slurred speech. LSN-0030. NIH-6 for R facial droop, mild aphasia and dyarthria, L leg drift and L leg sensory deficit.  CT-hpoattenuation within the R cerebellum, consistent with age indeterminate infarct.  Per neurology, findings inconsistent with stroke. MRI ordered. Plan to admit to medical team.

## 2017-05-08 NOTE — ED Notes (Signed)
Pt eating lunch tray at this time  

## 2017-05-08 NOTE — Progress Notes (Signed)
2D Echocardiogram has been performed.  Mitchell [name] 05/08/2017, 2:59 PM

## 2017-05-08 NOTE — Plan of Care (Signed)
Mitchell [name] is a 46 year old male with past medical history significant for HTN, HLD, CHF, DM type II, MI, CVA with residual deficit, and OSA; who presents with complaints of feeling weak. Patient found with right-sided facial droop and slurred speech. Stroke activated. CT scan of the brain showed an age indeterminate hypodensity in the right cerebellum. Neurology evaluated recommended completion of stroke workup. Labs revealed WBC 14, BUN 21, creatinine 1.39. MRI/MRA and IV fluids at 75 mL per hour ordered. Will need workup leukocytosis and possible acute kidney injury. Admit as observation to a telemetry bed.

## 2017-05-09 ENCOUNTER — Encounter (HOSPITAL_COMMUNITY): Payer: Self-pay | Admitting: *Deleted

## 2017-05-09 DIAGNOSIS — R531 Weakness: Secondary | ICD-10-CM

## 2017-05-09 DIAGNOSIS — N179 Acute kidney failure, unspecified: Secondary | ICD-10-CM | POA: Diagnosis not present

## 2017-05-09 LAB — ECHOCARDIOGRAM COMPLETE
Height: 72 in
WEIGHTICAEL: 4480 [oz_av]

## 2017-05-09 LAB — BASIC METABOLIC PANEL
ANION GAP: 11 (ref 5–15)
BUN: 15 mg/dL (ref 6–20)
CALCIUM: 8.7 mg/dL — AB (ref 8.9–10.3)
CO2: 25 mmol/L (ref 22–32)
Chloride: 105 mmol/L (ref 101–111)
Creatinine, Ser: 0.88 mg/dL (ref 0.61–1.24)
Glucose, Bld: 269 mg/dL — ABNORMAL HIGH (ref 65–99)
Potassium: 3.3 mmol/L — ABNORMAL LOW (ref 3.5–5.1)
Sodium: 141 mmol/L (ref 135–145)

## 2017-05-09 LAB — CBC
HCT: 37.8 % — ABNORMAL LOW (ref 39.0–52.0)
HEMOGLOBIN: 12.5 g/dL — AB (ref 13.0–17.0)
MCH: 29.2 pg (ref 26.0–34.0)
MCHC: 33.1 g/dL (ref 30.0–36.0)
MCV: 88.3 fL (ref 78.0–100.0)
PLATELETS: 152 10*3/uL (ref 150–400)
RBC: 4.28 MIL/uL (ref 4.22–5.81)
RDW: 12.6 % (ref 11.5–15.5)
WBC: 8.8 10*3/uL (ref 4.0–10.5)

## 2017-05-09 LAB — GLUCOSE, CAPILLARY
Glucose-Capillary: 297 mg/dL — ABNORMAL HIGH (ref 65–99)
Glucose-Capillary: 265 mg/dL — ABNORMAL HIGH (ref 65–99)

## 2017-05-09 MED ORDER — INSULIN ASPART 100 UNIT/ML ~~LOC~~ SOLN
6.0000 [IU] | Freq: Three times a day (TID) | SUBCUTANEOUS | Status: DC
  Administered 2017-05-09: 6 [IU] via SUBCUTANEOUS

## 2017-05-09 MED ORDER — CLOPIDOGREL BISULFATE 75 MG PO TABS: 75.0000 mg | ORAL_TABLET | Freq: Every day | ORAL | 0 refills | Status: DC

## 2017-05-09 MED ORDER — ASPIRIN 81 MG PO TBEC: 81.0000 mg | DELAYED_RELEASE_TABLET | Freq: Every day | ORAL | 0 refills | Status: DC

## 2017-05-09 MED ORDER — POTASSIUM CHLORIDE CRYS ER 20 MEQ PO TBCR
40.0000 meq | EXTENDED_RELEASE_TABLET | Freq: Once | ORAL | Status: AC
  Administered 2017-05-09: 40 meq via ORAL
  Filled 2017-05-09: qty 2

## 2017-05-09 MED ORDER — INSULIN GLARGINE 100 UNIT/ML ~~LOC~~ SOLN
20.0000 [IU] | Freq: Two times a day (BID) | SUBCUTANEOUS | Status: DC
  Administered 2017-05-09: 20 [IU] via SUBCUTANEOUS
  Filled 2017-05-09 (×2): qty 0.2

## 2017-05-09 MED ORDER — ATORVASTATIN CALCIUM 40 MG PO TABS: 80.0000 mg | ORAL_TABLET | Freq: Every day | ORAL | 0 refills | Status: DC

## 2017-05-09 MED ORDER — ASPIRIN EC 81 MG PO TBEC
81.0000 mg | DELAYED_RELEASE_TABLET | Freq: Every day | ORAL | Status: DC
  Administered 2017-05-09: 81 mg via ORAL
  Filled 2017-05-09: qty 1

## 2017-05-09 NOTE — Progress Notes (Signed)
Patient was discharged home by MD order; discharged instructions review and give to patient with care notes; IV DIC;  patient will be escorted to the car by nurse tech via wheelchair.  

## 2017-05-09 NOTE — Progress Notes (Addendum)
SLP Cancellation Note  Patient Details Name: Mitchell [name] MRN: [id number] DOB: [birth year]   Cancelled treatment:       Reason Eval/Treat Not Completed: Patient declined, stating he is at his baseline. Pt reported he did not have his dentures in, which is why his speech was slurred. Mild right orofacial weakness residual from prior CVA. RN informed of pt refusal. ST signing off. Please reconsult if needs arise.   Wade Sigala B. Murvin Natal [facility], CCC-SLP Speech Language Pathologist [phone]  Leigh Aurora 05/09/2017, 12:12 PM

## 2017-05-09 NOTE — Progress Notes (Signed)
PT Cancellation Note  Patient Details Name: Mitchell [name] MRN: [id number] DOB: [birth year]   Cancelled Treatment:    Reason Eval/Treat Not Completed: PT screened, no needs identified, will sign off.  Corroborated by OT 05/09/2017  Bruce Bing, PT [phone]  (pager)   Eliseo Gum Niralya Ohanian 05/09/2017, 1:44 PM

## 2017-05-09 NOTE — Discharge Summary (Addendum)
Physician Discharge Summary  Mitchell [name] ZOX:[id number] DOB: [birth year] DOA: 05/08/2017  PCP: Pearline Cables, MD  Admit date: 05/08/2017 Discharge date: 05/09/2017  Admitted From: Home Disposition:  Home  Recommendations for Outpatient Follow-up:  1. Follow up with PCP in 1 week 2. Need improved diabetes control 3. Follow-up with Heart Failure clinic regarding CHF, ?AICD placement indication  4. Follow-up with Dr. Roda Shutters (Neurology) in 6 weeks  5. Please obtain BMP in 1 week   Discharge Condition: Stable CODE STATUS: Full  Diet recommendation: Heart healthy, carb modified  Brief/Interim Summary: Mitchell [name] is a 46 y.o. male with medical history significant past medical history that includes diabetes, hypertension, obesity, stroke x3 with chronic right facial droop, chronic left weakness, schizophrenia, nonischemic cardiomyopathy, hyperlipidemia, chronic systolic heart failure; he came to the ED with chief complaint of generalized weakness and slurred speech. [facility] asked to admit for stroke workup.   Patient reports he was his usual state of health until he was at work last night he began to feel "weak all over" he continued to try and work but became even more weak. He sat down and coworkers report they found him slumped over but conscious. They also report he seemed to have right-sided facial droop and slurred speech. EMS was called and reported the same findings and activated a code stroke. He was transported to the ER and evaluated by neurology. He does have a history of a previous strokewith residual right-sided weakness and right facial droop. His symptoms did not appear to be related to a new stroke, more consistent with either metabolic or cardiac etiology. Nevertheless, patient underwent stroke workup as below. He was started on dual antiplatelet therapy as patient has had previous strokes with increased risk factors. Patient also had acute kidney injury and was  given IV fluids with creatinine returned back to normal. On day of discharge, patient had no focal neurologic deficits and was feeling well for discharge.  Discharge Diagnoses:  Principal Problem:   Slurred speech Active Problems:   Diabetes mellitus with complication (HCC)   HLD (hyperlipidemia)   OBESITY   Obstructive sleep apnea   Essential hypertension   Chronic systolic heart failure (HCC)   Weakness   Acute kidney injury (HCC)   Leukocytosis   Generalized weakness -Code stroke was activated due to slurred speech, right facial droop, right-sided weakness. However, patient has history of stroke 3 with residual right-sided deficits. Patient stated that the slurred speech was due to not having his dentures in place. Patient was evaluated by neurology, who believed his symptoms are more consistent with infection versus metabolic versus cardiac etiology -Spoke with Dr. Roda Shutters today. He recommends outpatient follow up with cardiology for possible discussion for AICD placement with patient's near-syncopal presentation and low EF  -At baseline today, ambulating well   AKI -Due to dehydration. He states that his work place was unusually hot and he was sweating profusely.  -Resolved with IVF   Hx CVA with residual right facial droop, right weakness  -He was started on dual antiplatelet therapy as patient has had previous strokes with increased risk factors.   Chronic systolic HF, NICM -EF 20% -Euvolemic on exam -Resume home meds   HTN -Resume home meds   HLD -Resume statin   DM type 2 -Uncontrolled with hyperglycemia -Patient was transitioned to Lantus and sliding scale insulin while inpatient. Resume home Novolin 70/30 regimen, patient needs close follow-up with PCP    Discharge Instructions  Discharge Instructions  Call MD for:  difficulty breathing, headache or visual disturbances    Complete by:  As directed    Call MD for:  extreme fatigue    Complete by:  As  directed    Call MD for:  hives    Complete by:  As directed    Call MD for:  persistant dizziness or light-headedness    Complete by:  As directed    Call MD for:  persistant nausea and vomiting    Complete by:  As directed    Call MD for:  severe uncontrolled pain    Complete by:  As directed    Call MD for:  temperature >100.4    Complete by:  As directed    Diet - low sodium heart healthy    Complete by:  As directed    Discharge instructions    Complete by:  As directed    You were cared for by a hospitalist during your hospital stay. If you have any questions about your discharge medications or the care you received while you were in the hospital after you are discharged, you can call the unit and asked to speak with the hospitalist on call if the hospitalist that took care of you is not available. Once you are discharged, your primary care physician will handle any further medical issues. Please note that NO REFILLS for any discharge medications will be authorized once you are discharged, as it is imperative that you return to your primary care physician (or establish a relationship with a primary care physician if you do not have one) for your aftercare needs so that they can reassess your need for medications and monitor your lab values.   Increase activity slowly    Complete by:  As directed      Allergies as of 05/09/2017      Reactions   Nsaids Anaphylaxis      Medication List    TAKE these medications   aspirin 81 MG EC tablet Take 1 tablet (81 mg total) by mouth daily.   atorvastatin 40 MG tablet Commonly known as:  LIPITOR Take 2 tablets (80 mg total) by mouth at bedtime. What changed:  how much to take   carvedilol 6.25 MG tablet Commonly known as:  COREG Take 1 tablet (6.25 mg total) by mouth 2 (two) times daily with a meal.   cetirizine 10 MG tablet Commonly known as:  ZYRTEC ALLERGY Take 1 tablet (10 mg total) by mouth daily.   clopidogrel 75 MG  tablet Commonly known as:  PLAVIX Take 1 tablet (75 mg total) by mouth daily.   digoxin 0.125 MG tablet Commonly known as:  LANOXIN Take 1 tablet (0.125 mg total) by mouth daily.   fluticasone 50 MCG/ACT nasal spray Commonly known as:  FLONASE Place 2 sprays into both nostrils daily.   furosemide 40 MG tablet Commonly known as:  LASIX Take 1.5 tablets (60 mg total) by mouth daily.   glucose blood test strip Pt uses one touch.  Test BID   insulin NPH-regular Human (70-30) 100 UNIT/ML injection Commonly known as:  NOVOLIN 70/30 Inject 45 Units into the skin 2 (two) times daily.   Lancets 28G Misc Use for glucose testing up to BID   Potassium Chloride ER 20 MEQ Tbcr Take 20 mEq by mouth daily.   sacubitril-valsartan 49-51 MG Commonly known as:  ENTRESTO Take 1 tablet by mouth 2 (two) times daily.      Follow-up Information  [name], Gwenlyn Found, MD. Schedule an appointment as soon as possible for a visit in 1 week(s).   Specialty:  Family Medicine Contact information: [address] [place] [address] [phone]        Marvel Plan, MD. Schedule an appointment as soon as possible for a visit in 6 week(s).   Specialty:  Neurology Contact information: [address] [place] [address] [phone]        [facility]. Schedule an appointment as soon as possible for a visit.   Specialty:  Cardiology Contact information: [address] [id number] mc [place] [address] [phone]         Allergies  Allergen Reactions  . Nsaids Anaphylaxis    Consultations:  Neurology   Procedures/Studies: Dg Chest 2 View  Result Date: 05/08/2017 CLINICAL DATA:  Stroke,   No chest complaints EXAM: CHEST  2 VIEW COMPARISON:  08/23/2016 FINDINGS: Lateral view degraded by patient size. Midline trachea. Mild cardiomegaly. No pleural effusion or pneumothorax. Left  hemidiaphragm elevation. No congestive failure. No lobar consolidation. Mild low low lung volumes. IMPRESSION: Mild cardiomegaly and low lung volumes.  No acute findings. Electronically Signed   By: Jeronimo Greaves M.D.   On: 05/08/2017 07:09   Mr Maxine Glenn Head Wo Contrast  Result Date: 05/08/2017 CLINICAL DATA:  Followup stroke presentation. Right-sided facial droop and slurred speech beginning earlier today. EXAM: MRI HEAD WITHOUT CONTRAST MRA HEAD WITHOUT CONTRAST TECHNIQUE: Multiplanar, multiecho pulse sequences of the brain and surrounding structures were obtained without intravenous contrast. Angiographic images of the head were obtained using MRA technique without contrast. COMPARISON:  CT 05/08/2017.  MRI 09/19/2007. FINDINGS: MRI HEAD FINDINGS Brain: Diffusion imaging does not show any acute or subacute infarction. Mild chronic small-vessel ischemic change affects the pons. There is an old inferior cerebellar stroke on the right. Cerebral hemispheres are normal without evidence of recent or old small or large vessel infarction. No mass lesion, hemorrhage, hydrocephalus or extra-axial collection. Vascular: Major vessels at the base of the brain show flow. Skull and upper cervical spine: Negative Sinuses/Orbits: Clear/normal Other: None MRA HEAD FINDINGS Both internal carotid arteries are patent through the skullbase. There is no stenosis on the right. On the left, there is moderate stenosis in the proximal siphon. The anterior and middle cerebral vessels are patent. Question narrowing and irregularity of the M1 segments bilaterally with an apparent focal stenosis in the M1 segment on the left that could be severe, with mild poststenotic dilatation. The A1 segment on the left is diminutive, which could be congenital or acquired. Dominant A1 segment on the right with robust supply to both anterior cerebral artery territories. Both vertebral arteries approximately equal in size. Right vertebral artery shows patency  through the foramen magnum with mild focal stenosis and irregularity proximal to right PICA. Right PICA shows flow but shows proximal stenosis. Left vertebral artery shows 50% stenosis just beyond left PICA origin. There is also 50% stenosis of the distal left vertebral artery just proximal to the basilar. No basilar stenosis. Superior cerebellar and posterior cerebral arteries are patent. Mild atherosclerotic narrowing and irregularity of the proximal right PCA. Question proximal superior cerebellar stenoses. IMPRESSION: No acute or subacute brain insult. Old right inferior cerebellar infarction. Minimal T2 signal in the pons suggesting mild small vessel change. No other brain abnormality. Notably, there is an absence of small-vessel changes of the cerebral hemispheres. Markedly abnormal intracranial MR angiogram. Given the  absence of obvious atherosclerotic disease on a chest CT of November 2017, this raises the question of other types of vasculopathy. Moderate stenosis in the left carotid siphon. Narrowing and irregularity of the M1 segments bilaterally, possibly severe on the left. Diminutive left A1 segment which could be acquired or congenital. Bilateral vertebral stenoses, left worse than right. Narrowing and irregularity of the proximal right PCA. Possible superior cerebellar artery stenoses. Electronically Signed   By: Paulina Fusi M.D.   On: 05/08/2017 08:58   Mr Brain Wo Contrast  Result Date: 05/08/2017 CLINICAL DATA:  Followup stroke presentation. Right-sided facial droop and slurred speech beginning earlier today. EXAM: MRI HEAD WITHOUT CONTRAST MRA HEAD WITHOUT CONTRAST TECHNIQUE: Multiplanar, multiecho pulse sequences of the brain and surrounding structures were obtained without intravenous contrast. Angiographic images of the head were obtained using MRA technique without contrast. COMPARISON:  CT 05/08/2017.  MRI 09/19/2007. FINDINGS: MRI HEAD FINDINGS Brain: Diffusion imaging does not show any  acute or subacute infarction. Mild chronic small-vessel ischemic change affects the pons. There is an old inferior cerebellar stroke on the right. Cerebral hemispheres are normal without evidence of recent or old small or large vessel infarction. No mass lesion, hemorrhage, hydrocephalus or extra-axial collection. Vascular: Major vessels at the base of the brain show flow. Skull and upper cervical spine: Negative Sinuses/Orbits: Clear/normal Other: None MRA HEAD FINDINGS Both internal carotid arteries are patent through the skullbase. There is no stenosis on the right. On the left, there is moderate stenosis in the proximal siphon. The anterior and middle cerebral vessels are patent. Question narrowing and irregularity of the M1 segments bilaterally with an apparent focal stenosis in the M1 segment on the left that could be severe, with mild poststenotic dilatation. The A1 segment on the left is diminutive, which could be congenital or acquired. Dominant A1 segment on the right with robust supply to both anterior cerebral artery territories. Both vertebral arteries approximately equal in size. Right vertebral artery shows patency through the foramen magnum with mild focal stenosis and irregularity proximal to right PICA. Right PICA shows flow but shows proximal stenosis. Left vertebral artery shows 50% stenosis just beyond left PICA origin. There is also 50% stenosis of the distal left vertebral artery just proximal to the basilar. No basilar stenosis. Superior cerebellar and posterior cerebral arteries are patent. Mild atherosclerotic narrowing and irregularity of the proximal right PCA. Question proximal superior cerebellar stenoses. IMPRESSION: No acute or subacute brain insult. Old right inferior cerebellar infarction. Minimal T2 signal in the pons suggesting mild small vessel change. No other brain abnormality. Notably, there is an absence of small-vessel changes of the cerebral hemispheres. Markedly abnormal  intracranial MR angiogram. Given the absence of obvious atherosclerotic disease on a chest CT of November 2017, this raises the question of other types of vasculopathy. Moderate stenosis in the left carotid siphon. Narrowing and irregularity of the M1 segments bilaterally, possibly severe on the left. Diminutive left A1 segment which could be acquired or congenital. Bilateral vertebral stenoses, left worse than right. Narrowing and irregularity of the proximal right PCA. Possible superior cerebellar artery stenoses. Electronically Signed   By: Paulina Fusi M.D.   On: 05/08/2017 08:58   Ct Head Code Stroke Wo Contrast  Addendum Date: 05/08/2017   ADDENDUM REPORT: 05/08/2017 03:56 ADDENDUM: These results were called by telephone at the time of interpretation on 05/08/2017 at 3:50 am to Dr. Caryl Pina , who verbally acknowledged these results. Electronically Signed   By: Deatra Robinson  M.D.   On: 05/08/2017 03:56   Result Date: 05/08/2017 CLINICAL DATA:  Code stroke. Right-sided facial droop and slurred speech EXAM: CT HEAD WITHOUT CONTRAST TECHNIQUE: Contiguous axial images were obtained from the base of the skull through the vertex without intravenous contrast. COMPARISON:  Head CT 03/14/2009 FINDINGS: Brain: No mass lesion or acute hemorrhage. There is focal hypoattenuation within the lateral right cerebellar hemisphere. Basal ganglia are preserved. No cerebral portable hypoattenuation. No hydrocephalus or age advanced atrophy. Vascular: No hyperdense vessel. No advanced atherosclerotic calcification of the arteries at the skull base. Skull: Normal visualized skull base, calvarium and extracranial soft tissues. Sinuses/Orbits: No sinus fluid levels or advanced mucosal thickening. No mastoid effusion. Normal orbits. ASPECTS [facility] Stroke Program Early CT Score) - Ganglionic level infarction (caudate, lentiform nuclei, internal capsule, insula, M1-M3 cortex): 7 - Supraganglionic infarction (M4-M6 cortex): 3  Total score (0-10 with 10 being normal): 10 IMPRESSION: 1. No hemorrhage or mass lesion. 2. Hypoattenuation within the right cerebellum, consistent with age indeterminate infarct. This is new compared to 06/21/2015. 3. ASPECTS is 10. Dr. Otelia Limes paged at 3:30 a.m. on 05/08/2017. Electronically Signed: By: Deatra Robinson M.D. On: 05/08/2017 03:35    Echo Study Conclusions  - Left ventricle: The cavity size was severely dilated. There was   moderate concentric hypertrophy. Systolic function was severely   reduced. The estimated ejection fraction was in the range of 20%   to 25%. Wall motion was normal; there were no regional wall   motion abnormalities. Features are consistent with a pseudonormal   left ventricular filling pattern, with concomitant abnormal   relaxation and increased filling pressure (grade 2 diastolic   dysfunction). - Left atrium: The atrium was mildly dilated. - Pulmonic valve: There was trivial regurgitation. - Pericardium, extracardiac: A small, free-flowing pericardial   effusion was identified along the right ventricular free wall. The fluid had no internal echoes.   Carotid US Summary:  - The vertebral arteries appear patent with antegrade flow. - Findings consistent with a 1-39 percent stenosis involving the right internal carotid artery and the left internal carotid artery.  Discharge Exam: Vitals:   05/09/17 0532 05/09/17 1310  BP: (!) 147/83 (!) 146/95  Pulse: 84 86  Resp: 18 18  Temp: 98.2 F (36.8 C) 98 F (36.7 C)  SpO2: 97% 98%   Vitals:   05/08/17 2115 05/09/17 0020 05/09/17 0532 05/09/17 1310  BP: (!) 154/90 139/80 (!) 147/83 (!) 146/95  Pulse: 87 78 84 86  Resp: 18 20 18 18   Temp: 98.2 F (36.8 C)  98.2 F (36.8 C) 98 F (36.7 C)  TempSrc: Oral  Oral Oral  SpO2: 98% 97% 97% 98%  Weight:      Height:        General: Pt is alert, awake, not in acute distress Cardiovascular: RRR, S1/S2 +, no rubs, no gallops Respiratory: CTA  bilaterally, no wheezing, no rhonchi Abdominal: Soft, NT, ND, bowel sounds + Extremities: no edema, no cyanosis Neuro: chronic right facial droop    The results of significant diagnostics from this hospitalization (including imaging, microbiology, ancillary and laboratory) are listed below for reference.     Microbiology: No results found for this or any previous visit (from the past 240 hour(s)).   Labs: BNP (last 3 results)  Recent Labs  06/11/16 0411  BNP 266.9*   Basic Metabolic Panel:  Recent Labs Lab 05/08/17 0322 05/08/17 0330 05/09/17 0330  NA 141 144 141  K 3.8 3.7 3.3*  CL 103 103 105  CO2 26  --  25  GLUCOSE 131* 133* 269*  BUN 21* 29* 15  CREATININE 1.39* 1.40* 0.88  CALCIUM 9.6  --  8.7*   Liver Function Tests:  Recent Labs Lab 05/08/17 0322  AST 34  ALT 14*  ALKPHOS 88  BILITOT 1.1  PROT 7.6  ALBUMIN 4.1   No results for input(s): LIPASE, AMYLASE in the last 168 hours. No results for input(s): AMMONIA in the last 168 hours. CBC:  Recent Labs Lab 05/08/17 0322 05/08/17 0330 05/09/17 0330  WBC 14.0*  --  8.8  NEUTROABS 10.8*  --   --   HGB 14.4 14.6 12.5*  HCT 42.1 43.0 37.8*  MCV 87.2  --  88.3  PLT 180  --  152   Cardiac Enzymes: No results for input(s): CKTOTAL, CKMB, CKMBINDEX, TROPONINI in the last 168 hours. BNP: Invalid input(s): POCBNP CBG:  Recent Labs Lab 05/08/17 0828 05/08/17 1204 05/08/17 1731 05/09/17 0757 05/09/17 1215  GLUCAP 180* 164* 160* 297* 265*   D-Dimer No results for input(s): DDIMER in the last 72 hours. Hgb A1c  Recent Labs  05/08/17 0322  HGBA1C 12.0*   Lipid Profile  Recent Labs  05/08/17 0322  CHOL 211*  HDL 44  LDLCALC 127*  TRIG 201*  CHOLHDL 4.8   Thyroid function studies No results for input(s): TSH, T4TOTAL, T3FREE, THYROIDAB in the last 72 hours.  Invalid input(s): FREET3 Anemia work up No results for input(s): VITAMINB12, FOLATE, FERRITIN, TIBC, IRON, RETICCTPCT in  the last 72 hours. Urinalysis    Component Value Date/Time   COLORURINE YELLOW 05/08/2017 1208   APPEARANCEUR CLEAR 05/08/2017 1208   LABSPEC 1.019 05/08/2017 1208   PHURINE 5.0 05/08/2017 1208   GLUCOSEU NEGATIVE 05/08/2017 1208   HGBUR NEGATIVE 05/08/2017 1208   BILIRUBINUR NEGATIVE 05/08/2017 1208   KETONESUR NEGATIVE 05/08/2017 1208   PROTEINUR 100 (A) 05/08/2017 1208   UROBILINOGEN 0.2 12/08/2014 2355   NITRITE NEGATIVE 05/08/2017 1208   LEUKOCYTESUR NEGATIVE 05/08/2017 1208   Sepsis Labs Invalid input(s): PROCALCITONIN,  WBC,  LACTICIDVEN Microbiology No results found for this or any previous visit (from the past 240 hour(s)).   Time coordinating discharge: 40 minutes  SIGNED:  Noralee Stain, DO [facility] Pager [phone]  If 7PM-7AM, please contact night-coverage [email or web address] Password [email or web address] 05/09/2017, 1:51 PM

## 2017-05-09 NOTE — Progress Notes (Signed)
  Referral received. Spoke with patient regarding A1C of 12%.  He states that he has been taking 70/30 45 units bid prior to admit.  He was not checking his blood sugars due to losing his meter.  He states he plans to get another meter from his dr.  I also told him about the [employer] meter which is 14$.  We discussed the importance of controlling blood sugars.  He is interested in going to outpatient diabetes education.  Will order per protocol.   We discussed normal blood sugars and monitoring as well. Patient is eager to get home.   Thanks, Beryl Meager, RN, BC-ADM Inpatient Diabetes Coordinator Pager [phone] (8a-5p)

## 2017-05-09 NOTE — Evaluation (Signed)
Occupational Therapy Evaluation Patient Details Name: Mitchell [name] IDA MRN: [id number] DOB: 1970/08/02 Today's Date: 05/09/2017    History of Present Illness Pt is a 46 y.o. male with medical history including diabetes, hypertension, obesity, stroke 2005 with some right-sided left weakness, schizophrenia, obstructive sleep apnea, nonischemic cardiomyopathy, hyperlipidemia chronic systolic heart failure. Presented to ED with complaints of generalized weakness and slurred speech. MRI revealed no acute or subacute brain insult. Old right inferior cerebellar infarction   Clinical Impression   PTA Pt independent in ADL and functional transfers. Pt currently at baseline. Pt was able to ambulate around the room without assist/DME, perform standing grooming at sink, LB dressing sitting EOB, and several fine motor tasks. Pt familiar with signs and symptoms of CVA (he has had 3) and feels like "I just got over heated and dehydrated". Education complete. No further questions or concerns from OT. Thank you for this referral and opportunity to serve this patient. OT to sign off.    Follow Up Recommendations  No OT follow up    Equipment Recommendations  None recommended by OT    Recommendations for Other Services       Precautions / Restrictions Precautions Precautions: Fall Restrictions Weight Bearing Restrictions: No      Mobility Bed Mobility Overal bed mobility: Modified Independent             General bed mobility comments: use of bed rail to assist, otherwise independent  Transfers Overall transfer level: Independent Equipment used: None             General transfer comment: no LOB or unsteadyness    Balance Overall balance assessment: No apparent balance deficits (not formally assessed)                                         ADL either performed or assessed with clinical judgement   ADL Overall ADL's : Modified independent                                        General ADL Comments: able to ambulate to sink, perform standing grooming tasks, LB dressing sitting EOB, fine motor for dressing (getting belt switched over from other pants) Pt reports he is back to baseline     Vision Baseline Vision/History: Wears glasses Wears Glasses: At all times Patient Visual Report: No change from baseline Vision Assessment?: No apparent visual deficits     Perception     Praxis      Pertinent Vitals/Pain Pain Assessment: No/denies pain     Hand Dominance Right   Extremity/Trunk Assessment Upper Extremity Assessment Upper Extremity Assessment: Overall WFL for tasks assessed (R side is 4+ from previous CVA)   Lower Extremity Assessment Lower Extremity Assessment: Overall WFL for tasks assessed (R side is 4+ from previous CVA)       Communication Communication Communication: No difficulties   Cognition Arousal/Alertness: Awake/alert Behavior During Therapy: WFL for tasks assessed/performed Overall Cognitive Status: Within Functional Limits for tasks assessed                                     General Comments  "I think I was just over heated and dehydrated" "I am back to  normal now"    Exercises     Shoulder Instructions      Home Living Family/patient expects to be discharged to:: Private residence Living Arrangements: Spouse/significant other;Children Available Help at Discharge: Family;Available PRN/intermittently Type of Home: House Home Access: Stairs to enter [employer] of Steps: 4 Entrance Stairs-Rails: Right Home Layout: One level     Bathroom Shower/Tub: Chief Strategy Officer: Standard Bathroom Accessibility: Yes How Accessible: Accessible via walker Home Equipment: Cane - single point;Shower seat;Bedside commode   Additional Comments: "I have everything I need from my previous strokes"      Prior Functioning/Environment Level of Independence:  Independent        Comments: works full time, Public librarian Problem List:        OT Treatment/Interventions:      OT Goals(Current goals can be found in the care plan section) Acute Rehab OT Goals Patient Stated Goal: to get home OT Goal Formulation: With patient Time For Goal Achievement: 05/23/17 Potential to Achieve Goals: Good  OT Frequency:     Barriers to D/C:            Co-evaluation              AM-PAC PT "6 Clicks" Daily Activity     Outcome Measure Help from another person eating meals?: None Help from another person taking care of personal grooming?: None Help from another person toileting, which includes using toliet, bedpan, or urinal?: None Help from another person bathing (including washing, rinsing, drying)?: None Help from another person to put on and taking off regular upper body clothing?: None Help from another person to put on and taking off regular lower body clothing?: None 6 Click Score: 24   End of Session Nurse Communication: Mobility status (Ok to start independent in room check off)  Activity Tolerance: Patient tolerated treatment well Patient left: in bed;with call bell/phone within reach (Pt declined sitting OOB in chair)                   Time: 1005-1027 OT Time Calculation (min): 22 min Charges:  OT General Charges $OT Visit: 1 Visit OT Evaluation $OT Eval Low Complexity: 1 Low G-Codes: OT G-codes **NOT FOR INPATIENT CLASS** Functional Assessment Tool Used: AM-PAC 6 Clicks Daily Activity Functional Limitation: Self care Self Care Current Status (B1478): 0 percent impaired, limited or restricted Self Care Goal Status (G9562): 0 percent impaired, limited or restricted Self Care Discharge Status (Z3086): 0 percent impaired, limited or restricted   Sherryl Manges OTR/L [phone]  Evern Bio Keishawn Darsey 05/09/2017, 10:36 AM

## 2017-05-09 NOTE — Consult Note (Signed)
WOC consulted for foot wound, he is using felt padding to offload a chronic DM ulceration.  He has the padding with him and has had family changing every two days. Does not need WOC services.    Re consult if needed, will not follow at this time. Thanks  Caris Cerveny [employer], RN,CWOCN, CNS, CWON-AP [phone])

## 2017-05-11 ENCOUNTER — Telehealth: Payer: Self-pay

## 2017-05-11 NOTE — Telephone Encounter (Signed)
05/11/17   TCM Hospital Follow Up  Transition Care Management Follow-up Telephone Call  ADMISSION DATE: 05/08/2017  DISCHARGE DATE: 05/09/2017   How have you been since you were released from the hospital? Patient states he has felt fine since discharge.   Do you understand why you were in the hospital? Per patient he was told that he did not have a stroke but dehydration in stead.   Do you understand the discharge instrcutions? Yes per patient    Items Reviewed:   Medications reviewed: Yes   Allergies reviewed:Yes   Dietary changes reviewed: Heart healthy,Low sodium, Carbohydrate modified.   Referrals reviewed: Yes. Appointment scheduled with Dr. Patsy Lager for hospital follow up. Patient going out of town so appointment had to be scheduled just shy of 1 week.   Functional Questionnaire:   Activities of Daily Living (ADLs): Patient states he is able to perform without assistance.  Any patient concerns?  None per patient   Confirmed importance and date/time of follow-up visits scheduled: Yes   Confirmed with patient if condition begins to worsen call PCP or go to the ER. Yes    Patient was given the office number and encouragred to call back with questions or concerns.Yes

## 2017-05-13 NOTE — Progress Notes (Deleted)
[facility] [address] [place], [place] [address] [phone]  Date:  05/14/2017   Name:  Mitchell [name]   DOB:  February 10, 1971   MRN:  [id number]  PCP:  Pearline Cables, MD    Chief Complaint: No chief complaint on file.   History of Present Illness:  Mitchell [name] is a 46 y.o. very pleasant male patient who presents with the following:  Here today for a hospital follow-up visit. He was admitted overnight last week.  Stacey's care is complicated by his mental illness  Lab Results  Component Value Date   HGBA1C 12.0 (H) 05/08/2017    I last saw him in April of this year.   Admit date: 05/08/2017 Discharge date: 05/09/2017  Recommendations for Outpatient Follow-up:  1. Follow up with PCP in 1 week 2. Need improved diabetes control 3. Follow-up with Heart Failure clinic regarding CHF, ?AICD placement indication  4. Follow-up with Dr. Roda Shutters (Neurology) in 6 weeks  5. Please obtain BMP in 1 week   Discharge Condition: Stable CODE STATUS: Full  Diet recommendation: Heart healthy, carb modified  Brief/Interim Summary: Mitchell L Forsteris a 46 y.o.malewith medical history significant past medical history that includes diabetes, hypertension, obesity, stroke x3 with chronic right facial droop, chronic left weakness, schizophrenia, nonischemic cardiomyopathy, hyperlipidemia, chronic systolic heart failure; he came to the ED with chief complaint of generalized weakness and slurred speech. [facility] hospitalists asked to admit for stroke workup.   Patient reports he was his usual state of health until he was at work last night he began to feel "weak all over" he continued to try and work but became even more weak. He sat down and coworkers report they found him slumped over but conscious. They also report he seemed to have right-sided facial droop and slurred speech. EMS was called and reported the same findings and  activated a code stroke. He was transported to the ER and evaluated by neurology. He does have a history of a previous strokewith residual right-sided weakness and right facial droop. His symptoms did not appear to be related to a new stroke, more consistent with either metabolic or cardiac etiology. Nevertheless, patient underwent stroke workup as below. He was started on dual antiplatelet therapy as patient has had previous strokes with increased risk factors. Patient also had acute kidney injury and was given IV fluids with creatinine returned back to normal. On day of discharge, patient had no focal neurologic deficits and was feeling well for discharge.  Discharge Diagnoses:  Principal Problem:   Slurred speech Active Problems:   Diabetes mellitus with complication (HCC)   HLD (hyperlipidemia)   OBESITY   Obstructive sleep apnea   Essential hypertension   Chronic systolic heart failure (HCC)   Weakness   Acute kidney injury (HCC)   Leukocytosis   Generalized weakness -Code stroke was activated due to slurred speech, right facial droop, right-sided weakness. However, patient has history of stroke 3 with residual right-sided deficits. Patient stated that the slurred speech was due to not having his dentures in place. Patient was evaluated by neurology, who believed his symptoms are more consistent with infection versus metabolic versus cardiac etiology -Spoke with Dr. Roda Shutters today. He recommends outpatient follow up with cardiology for possible discussion for AICD placement with patient's near-syncopal presentation and low EF  -At baseline today, ambulating well   AKI -Due to dehydration. He states that his work place  was unusually hot and he was sweating profusely.  -Resolved with IVF   Hx CVA with residual right facial droop, right weakness  -He was started on dual antiplatelet therapy as patient has had previous strokes with increased risk factors.   Chronic systolic HF,  NICM -EF 20% -Euvolemic on exam -Resume home meds   HTN -Resume home meds   HLD -Resume statin   DM type 2 -Uncontrolled with hyperglycemia -Patient was transitioned to Lantus and sliding scale insulin while inpatient. Resume home Novolin 70/30 regimen, patient needs close follow-up with PCP   Patient Active Problem List   Diagnosis Date Noted  . Weakness 05/08/2017  . Acute kidney injury (HCC) 05/08/2017  . Leukocytosis 05/08/2017  . Stroke (HCC) 05/08/2017  . Slurred speech 05/08/2017  . Acute combined systolic and diastolic congestive heart failure (HCC)   . Noncompliance 01/22/2014  . Acute respiratory failure with hypoxia (HCC) 11/25/2013  . Chronic systolic heart failure (HCC) 10/02/2011  . Erectile dysfunction 10/02/2011  . Acute on chronic systolic heart failure (HCC) 09/23/2011  . Obstructive sleep apnea 10/11/2007  . Diabetes mellitus with complication (HCC) 10/10/2007  . HLD (hyperlipidemia) 10/10/2007  . HYPOKALEMIA 10/10/2007  . OBESITY 10/10/2007  . Essential hypertension 10/10/2007  . PREMATURE VENTRICULAR CONTRACTIONS 10/10/2007  . SYNCOPE 10/10/2007  . COLONIC POLYPS, HX OF 10/10/2007    Past Medical History:  Diagnosis Date  . Bipolar disorder (HCC)   . CHF (congestive heart failure) (HCC)   . Chronic systolic heart failure (HCC) 11/2010   a. NICM 4/14 EF 20-25%, TR mild  . CVA (cerebral infarction)    No residual deficits  . Depression    PTSD,   . Diabetes mellitus    TYPE II; UNCONTROLLED BY HEMOGLOBIN A1c; STABLE AS  PER DISCHARGE  . Headache(784.0)   . 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 History of colonic polyps   . Hyperlipidemia   . Hypertension   . Myocardial infarction (HCC) 1987   (while playing football)  . Non-ischemic cardiomyopathy (HCC)    No ischemia on myoview, showed EF of 42%. 2D echo showed EF 50-55% with diatolic dysfunction in 2009  . Obesity   . OSA (obstructive sleep apnea)    Being set up again for c-pap  .  Pneumonia   . Post-cardiac injury syndrome (HCC)    History of cardiac injury from blunt trauma  . PVCs (premature ventricular contractions)   . Schizophrenia (HCC)    Goes to [facility]  . SOB (shortness of breath)   . Stroke [facility]) 2005   some left side weakness  . Syncope    Recurrent, thought to be vasovagal. Also has h/o frequent PVCs.     Past Surgical History:  Procedure Laterality Date  . CARDIAC CATHETERIZATION  12/19/10   DIFFUSE NONOBSTRUCTIVE CAD; NONISCHEMIC CARDIOMYOPATHY; LEFT VENTRICULAR ANGIOGRAM WAS PERFORMED SECONDARY TO  ELEVATED LEFT VENTRICULAR FILLING PRESSURES  . COLONOSCOPY W/ POLYPECTOMY    . MULTIPLE EXTRACTIONS WITH ALVEOLOPLASTY  01/27/2014   "all my teeth; 4 Quadrants of alveoloplasty  . MULTIPLE EXTRACTIONS WITH ALVEOLOPLASTY N/A 01/27/2014   Procedure: EXTRACTION OF TEETH #'1, 2, 3, 4, 5, 6, 7, 8, 9, 10, 11, 12, 13, 14, 15, 16, 17, 20, 21, 22, 23, 24, 25, 26, 27, 28, 29, 31 and 32 WITH ALVEOLOPLASTY;  Surgeon: Charlynne Pander, DDS;  Location: [facility] OR;  Service: Oral Surgery;  Laterality: N/A;  . ORIF FINGER / THUMB FRACTURE Right  Social History  Substance Use Topics  . Smoking status: Never Smoker  . Smokeless tobacco: Never Used  . Alcohol use No    Family History  Problem Relation Age of Onset  . Heart disease Mother        MI  . Heart failure Mother   . Diabetes Mother        ALSO IN MOST OF HIS SIBLINGS; 2 UNLCES HAVE ALSO PASSED AWAY FROM DM  . Cardiomyopathy Mother   . Cancer - Ovarian Mother   . Heart disease Father   . Hypertension Father   . Diabetes Father     Allergies  Allergen Reactions  . Nsaids Anaphylaxis    Medication list has been reviewed and updated.  Current Outpatient Prescriptions on File Prior to Visit  Medication Sig Dispense Refill  . aspirin EC 81 MG EC tablet Take 1 tablet (81 mg total) by mouth daily. 30 tablet 0  . atorvastatin (LIPITOR) 40 MG tablet Take 2 tablets (80 mg total) by  mouth at bedtime. 60 tablet 0  . carvedilol (COREG) 6.25 MG tablet Take 1 tablet (6.25 mg total) by mouth 2 (two) times daily with a meal. 180 tablet 3  . cetirizine (ZYRTEC ALLERGY) 10 MG tablet Take 1 tablet (10 mg total) by mouth daily. 30 tablet 0  . clopidogrel (PLAVIX) 75 MG tablet Take 1 tablet (75 mg total) by mouth daily. 30 tablet 0  . digoxin (LANOXIN) 0.125 MG tablet Take 1 tablet (0.125 mg total) by mouth daily. 90 tablet 3  . fluticasone (FLONASE) 50 MCG/ACT nasal spray Place 2 sprays into both nostrils daily. 16 g 0  . furosemide (LASIX) 40 MG tablet Take 1.5 tablets (60 mg total) by mouth daily. 180 tablet 3  . glucose blood test strip Pt uses one touch.  Test BID 100 each 12  . insulin NPH-regular Human (NOVOLIN 70/30) (70-30) 100 UNIT/ML injection Inject 45 Units into the skin 2 (two) times daily. 30 mL 0  . Lancets 28G MISC Use for glucose testing up to BID 100 each 12  . Potassium Chloride ER 20 MEQ TBCR Take 20 mEq by mouth daily. 30 tablet 3  . sacubitril-valsartan (ENTRESTO) 49-51 MG Take 1 tablet by mouth 2 (two) times daily. 60 tablet 3   No current facility-administered medications on file prior to visit.     Review of Systems:  As per HPI- otherwise negative.   Physical Examination: There were no vitals filed for this visit. There were no vitals filed for this visit. There is no height or weight on file to calculate BMI. Ideal Body Weight:    GEN: WDWN, NAD, Non-toxic, A & O x 3 HEENT: Atraumatic, Normocephalic. Neck supple. No masses, No LAD. Ears and Nose: No external deformity. CV: RRR, No M/G/R. No JVD. No thrill. No extra heart sounds. PULM: CTA B, no wheezes, crackles, rhonchi. No retractions. No resp. distress. No accessory muscle use. ABD: S, NT, ND, +BS. No rebound. No HSM. EXTR: No c/c/e NEURO Normal gait.  PSYCH: Normally interactive. Conversant. Not depressed or anxious appearing.  Calm demeanor.    Assessment and  Plan: ***  Signed Abbe Amsterdam, MD

## 2017-05-14 ENCOUNTER — Inpatient Hospital Stay: Payer: No Typology Code available for payment source | Admitting: Family Medicine

## 2017-05-16 ENCOUNTER — Inpatient Hospital Stay: Payer: No Typology Code available for payment source | Admitting: Family Medicine

## 2017-05-20 NOTE — Progress Notes (Addendum)
[facility] [address], [address] [place], [place] [address] [phone]  Date:  05/23/2017   Name:  Mitchell [name]   DOB:  03-14-1971   MRN:  [id number]  PCP:  Pearline Cables, MD    Chief Complaint: Hospitalization Follow-up (Pt here for hosp f/u visit. Refills needed today. )   History of Present Illness:  Mitchell [name] is a 46 y.o. very pleasant male patient who presents with the following:  Hospital follow-up from admission earlier this month: Admit date: 05/08/2017 Discharge date: 05/09/2017  Recommendations for Outpatient Follow-up:  1. Follow up with PCP in 1 week 2. Need improved diabetes control 3. Follow-up with Heart Failure clinic regarding CHF, ?AICD placement indication  4. Follow-up with Dr. Roda Shutters (Neurology) in 6 weeks  5. Please obtain BMP in 1 week   Discharge Condition: Stable CODE STATUS: Full  Diet recommendation: Heart healthy, carb modified  Brief/Interim Summary: [name] a 46 y.o.malewith medical history significant past medical history that includes diabetes, hypertension, obesity, stroke x3 with chronic right facial droop, chronic left weakness, schizophrenia, nonischemic cardiomyopathy, hyperlipidemia, chronic systolic heart failure; he came to the ED with chief complaint of generalized weakness and slurred speech. [facility] asked to admit for stroke workup.   Patient reports he was his usual state of health until he was at work last night he began to feel "weak all over" he continued to try and work but became even more weak. He sat down and coworkers report they found him slumped over but conscious. They also report he seemed to have right-sided facial droop and slurred speech. EMS was called and reported the same findings and activated a code stroke. He was transported to the ER and evaluated by neurology. He does have a history of a previous strokewith residual  right-sided weakness and right facial droop. His symptoms did not appear to be related to a new stroke, more consistent with either metabolic or cardiac etiology. Nevertheless, patient underwent stroke workup as below. He was started on dual antiplatelet therapy as patient has had previous strokes with increased risk factors. Patient also had acute kidney injury and was given IV fluids with creatinine returned back to normal. On day of discharge, patient had no focal neurologic deficits and was feeling well for discharge.  Discharge Diagnoses:  Principal Problem:   Slurred speech Active Problems:   Diabetes mellitus with complication (HCC)   HLD (hyperlipidemia)   OBESITY   Obstructive sleep apnea   Essential hypertension   Chronic systolic heart failure (HCC)   Weakness   Acute kidney injury (HCC)   Leukocytosis  Generalized weakness -Code stroke was activated due to slurred speech, right facial droop, right-sided weakness. However, patient has history of stroke 3 with residual right-sided deficits. Patient stated that the slurred speech was due to not having his dentures in place. Patient was evaluated by neurology, who believed his symptoms are more consistent with infection versus metabolic versus cardiac etiology -Spoke with Dr. Roda Shutters today. He recommends outpatient follow up with cardiology for possible discussion for AICD placement with patient's near-syncopal presentation and low EF  -At baseline today, ambulating well   AKI -Due to dehydration. He states that his work place was unusually hot and he was sweating profusely.  -Resolved with IVF  Hx CVA with residual right facial droop, right weakness  -He was started on dual antiplatelet therapy as patient has had previous strokes  with increased risk factors.  Chronic systolic HF, NICM -EF 20% -Euvolemic on exam -Resume home meds  HTN -Resume home meds  HLD -Resume statin  DM type 2 -Uncontrolled with hyperglycemia -Patient  was transitioned to Lantus and sliding scale insulin while inpatient. Resume home Novolin 70/30 regimen, patient needs close follow-up with PCP   Otherwise I last saw him in April of this year, he had not followed up since regarding his blood sugars. He is now back on his lovolin 70/30 regimen Most recent A1c shows that he is quite out of control  He is not checking his blood sugar again, states that he moved and lost the meter we gave him back in April He also has trouble getting strips.   He is getting an appt with cardiology- pt reports that [facility] is planning to call him  He declines a flu shot today Due for a urine microalbumin today but does not think he can give a urine sample He notes midline lower back pain for the last 1-2 days. Does not radiate down his legs. No new numbness or weakness of legs.  He seems convinced that taking seroquel caused his back to hurt and does not to take it any longer. Encouraged him to discuss this with his mental health provider prior to stopping this medication  He has been taking aspirin for a couple of weeks, no allergy to this medication Will update allergies to reflect this   BP Readings from Last 3 Encounters:  05/23/17 132/90  05/09/17 (!) 146/95  03/18/17 (!) 177/106   Lab Results  Component Value Date   HGBA1C 12.0 (H) 05/08/2017    Patient Active Problem List   Diagnosis Date Noted  . Weakness 05/08/2017  . Acute kidney injury (HCC) 05/08/2017  . Leukocytosis 05/08/2017  . Stroke (HCC) 05/08/2017  . Slurred speech 05/08/2017  . Acute combined systolic and diastolic congestive heart failure (HCC)   . Noncompliance 01/22/2014  . Acute respiratory failure with hypoxia (HCC) 11/25/2013  . Chronic systolic heart failure (HCC) 10/02/2011  . Erectile dysfunction 10/02/2011  . Acute on chronic systolic heart failure (HCC) 09/23/2011  . Obstructive sleep apnea 10/11/2007  . Diabetes mellitus with complication (HCC) 10/10/2007  . HLD  (hyperlipidemia) 10/10/2007  . HYPOKALEMIA 10/10/2007  . OBESITY 10/10/2007  . Essential hypertension 10/10/2007  . PREMATURE VENTRICULAR CONTRACTIONS 10/10/2007  . SYNCOPE 10/10/2007  . COLONIC POLYPS, HX OF 10/10/2007    Past Medical History:  Diagnosis Date  . Bipolar disorder (HCC)   . CHF (congestive heart failure) (HCC)   . Chronic systolic heart failure (HCC) 11/2010   a. NICM 4/14 EF 20-25%, TR mild  . CVA (cerebral infarction)    No residual deficits  . Depression    PTSD,   . Diabetes mellitus    TYPE II; UNCONTROLLED BY HEMOGLOBIN A1c; STABLE AS  PER DISCHARGE  . Headache(784.0)   . 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 History of colonic polyps   . Hyperlipidemia   . Hypertension   . Myocardial infarction (HCC) 1987   (while playing football)  . Non-ischemic cardiomyopathy (HCC)    No ischemia on myoview, showed EF of 42%. 2D echo showed EF 50-55% with diatolic dysfunction in 2009  . Obesity   . OSA (obstructive sleep apnea)    Being set up again for c-pap  . Pneumonia   . Post-cardiac injury syndrome (HCC)    History of cardiac injury from blunt trauma  . PVCs (  premature ventricular contractions)   . Schizophrenia (HCC)    [facility]  . SOB (shortness of breath)   . Stroke [facility](HCC) 2005   some left side weakness  . Syncope    Recurrent, thought to be vasovagal. Also has h/o frequent PVCs.     Past Surgical History:  Procedure Laterality Date  . CARDIAC CATHETERIZATION  12/19/10   DIFFUSE NONOBSTRUCTIVE CAD; NONISCHEMIC CARDIOMYOPATHY; LEFT VENTRICULAR ANGIOGRAM WAS PERFORMED SECONDARY TO  ELEVATED LEFT VENTRICULAR FILLING PRESSURES  . COLONOSCOPY W/ POLYPECTOMY    . MULTIPLE EXTRACTIONS WITH ALVEOLOPLASTY  01/27/2014   "all my teeth; 4 Quadrants of alveoloplasty  . MULTIPLE EXTRACTIONS WITH ALVEOLOPLASTY N/A 01/27/2014   Procedure: EXTRACTION OF TEETH #'1, 2, 3, 4, 5, 6, 7, 8, 9, 10, 11, 12, 13, 14, 15, 16, 17, 20, 21, 22, 23, 24,  25, 26, 27, 28, 29, 31 and 32 WITH ALVEOLOPLASTY;  Surgeon: Charlynne Panderonald F Kulinski, DDS;  Location: [facility] OR;  Service: Oral Surgery;  Laterality: N/A;  . ORIF FINGER / THUMB FRACTURE Right     Social History  Substance Use Topics  . Smoking status: Never Smoker  . Smokeless tobacco: Never Used  . Alcohol use No    Family History  Problem Relation Age of Onset  . Heart disease Mother        MI  . Heart failure Mother   . Diabetes Mother        ALSO IN MOST OF HIS SIBLINGS; 2 UNLCES HAVE ALSO PASSED AWAY FROM DM  . Cardiomyopathy Mother   . Cancer - Ovarian Mother   . Heart disease Father   . Hypertension Father   . Diabetes Father     Allergies  Allergen Reactions  . Nsaids Anaphylaxis    Medication list has been reviewed and updated.  Current Outpatient Prescriptions on File Prior to Visit  Medication Sig Dispense Refill  . aspirin EC 81 MG EC tablet Take 1 tablet (81 mg total) by mouth daily. 30 tablet 0  . atorvastatin (LIPITOR) 40 MG tablet Take 2 tablets (80 mg total) by mouth at bedtime. 60 tablet 0  . carvedilol (COREG) 6.25 MG tablet Take 1 tablet (6.25 mg total) by mouth 2 (two) times daily with a meal. 180 tablet 3  . cetirizine (ZYRTEC ALLERGY) 10 MG tablet Take 1 tablet (10 mg total) by mouth daily. 30 tablet 0  . clopidogrel (PLAVIX) 75 MG tablet Take 1 tablet (75 mg total) by mouth daily. 30 tablet 0  . digoxin (LANOXIN) 0.125 MG tablet Take 1 tablet (0.125 mg total) by mouth daily. 90 tablet 3  . fluticasone (FLONASE) 50 MCG/ACT nasal spray Place 2 sprays into both nostrils daily. 16 g 0  . furosemide (LASIX) 40 MG tablet Take 1.5 tablets (60 mg total) by mouth daily. 180 tablet 3  . glucose blood test strip Pt uses one touch.  Test BID 100 each 12  . insulin NPH-regular Human (NOVOLIN 70/30) (70-30) 100 UNIT/ML injection Inject 45 Units into the skin 2 (two) times daily. 30 mL 0  . Lancets 28G MISC Use for glucose testing up to BID 100 each 12  . Potassium  Chloride ER 20 MEQ TBCR Take 20 mEq by mouth daily. 30 tablet 3  . sacubitril-valsartan (ENTRESTO) 49-51 MG Take 1 tablet by mouth 2 (two) times daily. 60 tablet 3   No current facility-administered medications on file prior to visit.     Review of Systems:  As  per HPI- otherwise negative. No fever or chills Positive for midline lower back pain   Physical Examination: Vitals:   05/23/17 1121  BP: 132/90  Pulse: 86  Temp: 98.1 F (36.7 C)  SpO2: 98%   Vitals:   05/23/17 1121  Weight: 289 lb 6.4 oz (131.3 kg)  Height: 6' (1.829 m)   Body mass index is 39.25 kg/m. Ideal Body Weight: Weight in (lb) to have BMI = 25: 183.9  GEN: WDWN, NAD, Non-toxic, A & O x 3, obese, otherwise looks well  HEENT: Atraumatic, Normocephalic. Neck supple. No masses, No LAD. Ears and Nose: No external deformity. CV: RRR, No M/G/R. No JVD. No thrill. No extra heart sounds. PULM: CTA B, no wheezes, crackles, rhonchi. No retractions. No resp. distress. No accessory muscle use. ABD: S, NT, ND, +BS. No rebound. No HSM. EXTR: No c/c/e NEURO Normal gait.  PSYCH: Normally interactive. Conversant. Not depressed or anxious appearing.  Calm demeanor.  Restricted flexion and extension of thoracolumbar spine, but no tenderness to palpatoin of the spine bones or associated muscles  Lumbar spine films today:  LUMBAR SPINE - COMPLETE 4+ VIEW  COMPARISON:  07/06/2015  FINDINGS: Normal alignment of the lumbar vertebral bodies. Disc spaces and vertebral bodies are maintained. The facets are normally aligned. No pars defects. The visualized bony pelvis is intact.  IMPRESSION: Normal alignment and no acute bony findings or significant degenerative changes. The   Assessment and Plan: Chronic bilateral low back pain without sciatica - Plan: DG Lumbar Spine Complete  Type 2 diabetes mellitus without complication, with long-term current use of insulin (HCC) - Plan: CBC, Basic metabolic panel, Microalbumin  / creatinine urine ratio, Ambulatory referral to Endocrinology  Mixed hyperlipidemia - Plan: atorvastatin (LIPITOR) 40 MG tablet  History of CVA (cerebrovascular accident) - Plan: clopidogrel (PLAVIX) 75 MG tablet, aspirin 81 MG EC tablet  Here today to follow-up from recent hospital stay He was thought to perhaps have had a CVA, but MRI did not show any acute stroke.  He was noted to have poorly controlled DM Referral to endocrinology Provided him with a new glucose meter today Refilled medications as above He has complaint of back pain which he feels convinced is due to seroquel. Advised him that I think this is unlikely, and urged him to discuss with his mental health provider/ don't stop med without a plan    Signed Abbe Amsterdam, MD addnd J[facility] 10/29-  Pt did not go to lab

## 2017-05-23 ENCOUNTER — Ambulatory Visit (HOSPITAL_BASED_OUTPATIENT_CLINIC_OR_DEPARTMENT_OTHER)
Admission: RE | Admit: 2017-05-23 | Discharge: 2017-05-23 | Disposition: A | Discharge status: Home / Self Care | Payer: No Typology Code available for payment source | Source: Ambulatory Visit | Attending: Family Medicine | Admitting: Family Medicine

## 2017-05-23 ENCOUNTER — Ambulatory Visit (INDEPENDENT_AMBULATORY_CARE_PROVIDER_SITE_OTHER): Payer: No Typology Code available for payment source | Admitting: Family Medicine

## 2017-05-23 VITALS — BP 132/90 | HR 86 | Temp 98.1°F | Ht 72.0 in | Wt 289.4 lb

## 2017-05-23 DIAGNOSIS — G8929 Other chronic pain: Secondary | ICD-10-CM

## 2017-05-23 DIAGNOSIS — E782 Mixed hyperlipidemia: Secondary | ICD-10-CM

## 2017-05-23 DIAGNOSIS — M545 Low back pain, unspecified: Secondary | ICD-10-CM

## 2017-05-23 DIAGNOSIS — E119 Type 2 diabetes mellitus without complications: Secondary | ICD-10-CM | POA: Diagnosis not present

## 2017-05-23 DIAGNOSIS — Z794 Long term (current) use of insulin: Secondary | ICD-10-CM

## 2017-05-23 DIAGNOSIS — Z8673 Personal history of transient ischemic attack (TIA), and cerebral infarction without residual deficits: Secondary | ICD-10-CM

## 2017-05-23 MED ORDER — ASPIRIN 81 MG PO TBEC: 81.0000 mg | DELAYED_RELEASE_TABLET | Freq: Every day | ORAL | 3 refills | Status: DC

## 2017-05-23 MED ORDER — CLOPIDOGREL BISULFATE 75 MG PO TABS: 75.0000 mg | ORAL_TABLET | Freq: Every day | ORAL | 11 refills | Status: DC

## 2017-05-23 MED ORDER — ATORVASTATIN CALCIUM 40 MG PO TABS: 80.0000 mg | ORAL_TABLET | Freq: Every day | ORAL | 11 refills | Status: DC

## 2017-05-23 NOTE — Patient Instructions (Signed)
It was good to see you today- take care and I will be in touch with your labs Please stop by the lab- we will check blood and urine today I am going to refer you to endocrinology to help [place] manage your diabetes!  Please discuss your concerns about Seroquel with your mental health provider

## 2017-06-20 ENCOUNTER — Ambulatory Visit (HOSPITAL_COMMUNITY)
Admission: RE | Admit: 2017-06-20 | Discharge: 2017-06-20 | Disposition: A | Discharge status: Home / Self Care | Payer: No Typology Code available for payment source | Source: Ambulatory Visit | Attending: Surgery | Admitting: Surgery

## 2017-06-20 ENCOUNTER — Other Ambulatory Visit: Payer: Self-pay | Admitting: Surgery

## 2017-06-20 ENCOUNTER — Encounter (HOSPITAL_BASED_OUTPATIENT_CLINIC_OR_DEPARTMENT_OTHER): Payer: No Typology Code available for payment source | Attending: Surgery

## 2017-06-20 DIAGNOSIS — E11621 Type 2 diabetes mellitus with foot ulcer: Secondary | ICD-10-CM | POA: Insufficient documentation

## 2017-06-20 DIAGNOSIS — M86171 Other acute osteomyelitis, right ankle and foot: Secondary | ICD-10-CM

## 2017-06-20 DIAGNOSIS — I10 Essential (primary) hypertension: Secondary | ICD-10-CM | POA: Diagnosis not present

## 2017-06-20 DIAGNOSIS — I252 Old myocardial infarction: Secondary | ICD-10-CM | POA: Insufficient documentation

## 2017-06-20 DIAGNOSIS — E1165 Type 2 diabetes mellitus with hyperglycemia: Secondary | ICD-10-CM | POA: Diagnosis not present

## 2017-06-20 DIAGNOSIS — E1142 Type 2 diabetes mellitus with diabetic polyneuropathy: Secondary | ICD-10-CM | POA: Insufficient documentation

## 2017-06-20 DIAGNOSIS — G4730 Sleep apnea, unspecified: Secondary | ICD-10-CM | POA: Diagnosis not present

## 2017-06-20 DIAGNOSIS — L97512 Non-pressure chronic ulcer of other part of right foot with fat layer exposed: Secondary | ICD-10-CM | POA: Insufficient documentation

## 2017-06-20 DIAGNOSIS — L97511 Non-pressure chronic ulcer of other part of right foot limited to breakdown of skin: Secondary | ICD-10-CM | POA: Diagnosis present

## 2017-06-28 DIAGNOSIS — E11621 Type 2 diabetes mellitus with foot ulcer: Secondary | ICD-10-CM | POA: Diagnosis not present

## 2017-07-03 ENCOUNTER — Other Ambulatory Visit (HOSPITAL_COMMUNITY): Payer: Self-pay | Admitting: Adult Health

## 2017-07-06 ENCOUNTER — Encounter (HOSPITAL_BASED_OUTPATIENT_CLINIC_OR_DEPARTMENT_OTHER): Payer: No Typology Code available for payment source | Attending: Internal Medicine

## 2017-07-06 DIAGNOSIS — L84 Corns and callosities: Secondary | ICD-10-CM | POA: Diagnosis present

## 2017-07-06 DIAGNOSIS — Z8631 Personal history of diabetic foot ulcer: Secondary | ICD-10-CM | POA: Insufficient documentation

## 2017-07-06 DIAGNOSIS — E1142 Type 2 diabetes mellitus with diabetic polyneuropathy: Secondary | ICD-10-CM | POA: Insufficient documentation

## 2017-07-09 ENCOUNTER — Ambulatory Visit: Payer: No Typology Code available for payment source | Admitting: Endocrinology

## 2017-07-16 ENCOUNTER — Encounter: Payer: Self-pay | Admitting: Podiatry

## 2017-07-16 ENCOUNTER — Ambulatory Visit (INDEPENDENT_AMBULATORY_CARE_PROVIDER_SITE_OTHER): Payer: No Typology Code available for payment source

## 2017-07-16 ENCOUNTER — Other Ambulatory Visit: Payer: Self-pay | Admitting: Podiatry

## 2017-07-16 ENCOUNTER — Ambulatory Visit (INDEPENDENT_AMBULATORY_CARE_PROVIDER_SITE_OTHER): Payer: No Typology Code available for payment source | Admitting: Podiatry

## 2017-07-16 VITALS — BP 173/106 | HR 97

## 2017-07-16 DIAGNOSIS — E0843 Diabetes mellitus due to underlying condition with diabetic autonomic (poly)neuropathy: Secondary | ICD-10-CM

## 2017-07-16 DIAGNOSIS — L97522 Non-pressure chronic ulcer of other part of left foot with fat layer exposed: Secondary | ICD-10-CM | POA: Diagnosis not present

## 2017-07-16 DIAGNOSIS — I70235 Atherosclerosis of native arteries of right leg with ulceration of other part of foot: Secondary | ICD-10-CM | POA: Diagnosis not present

## 2017-07-16 DIAGNOSIS — M79671 Pain in right foot: Secondary | ICD-10-CM

## 2017-07-16 DIAGNOSIS — L97512 Non-pressure chronic ulcer of other part of right foot with fat layer exposed: Secondary | ICD-10-CM | POA: Diagnosis not present

## 2017-07-16 NOTE — Progress Notes (Signed)
   Subjective:    Patient ID: Mitchell [name], male    DOB: [birth year], 46 y.o.   MRN: [id number]  HPI    Review of Systems  All other systems reviewed and are negative.      Objective:   Physical Exam        Assessment & Plan:

## 2017-07-18 NOTE — Progress Notes (Signed)
Subjective:  46 year old male with PMHx of DM presents to the clinic as a new patient with a complaint of an ulceration to the sub 5th MPJ of the right foot that has been present for approximately one year. He has been treated by the wound care center, Dr. Merilynn Finland, and was referred here for treatment. He denies any pain, drainage or bleeding. Patient is here for further evaluation and treatment.    Past Medical History:  Diagnosis Date  . Bipolar disorder (HCC)   . CHF (congestive heart failure) (HCC)   . Chronic systolic heart failure (HCC) 11/2010   a. NICM 4/14 EF 20-25%, TR mild  . CVA (cerebral infarction)    No residual deficits  . Depression    PTSD,   . Diabetes mellitus    TYPE II; UNCONTROLLED BY HEMOGLOBIN A1c; STABLE AS  PER DISCHARGE  . Headache(784.0)   . 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 History of colonic polyps   . Hyperlipidemia   . Hypertension   . Myocardial infarction (HCC) 1987   (while playing football)  . Non-ischemic cardiomyopathy (HCC)    No ischemia on myoview, showed EF of 42%. 2D echo showed EF 50-55% with diatolic dysfunction in 2009  . Obesity   . OSA (obstructive sleep apnea)    Being set up again for c-pap  . Pneumonia   . Post-cardiac injury syndrome (HCC)    History of cardiac injury from blunt trauma  . PVCs (premature ventricular contractions)   . Schizophrenia (HCC)    Goes to [facility]  . SOB (shortness of breath)   . Stroke [facility]) 2005   some left side weakness  . Syncope    Recurrent, thought to be vasovagal. Also has h/o frequent PVCs.       Objective/Physical Exam General: The patient is alert and oriented x3 in no acute distress.  Dermatology:  Wound #1 noted to the right sub 5th MPJ measuring approximately 0.1 x 0.1 x 0.1 cm (LxWxD).   To the noted ulceration(s), there is no eschar. There is a moderate amount of slough, fibrin, and necrotic tissue noted. Granulation tissue and wound base is red.  There is a minimal amount of serosanguineous drainage noted. There is no exposed bone muscle-tendon ligament or joint. There is no malodor. Periwound integrity is intact. Skin is warm, dry and supple bilateral lower extremities.  Vascular: Palpable pedal pulses bilaterally. No edema or erythema noted. Capillary refill within normal limits.  Neurological: Epicritic and protective threshold absent bilaterally.   Musculoskeletal Exam: Range of motion within normal limits to all pedal and ankle joints bilateral. Muscle strength 5/5 in all groups bilateral.   Radiographic Exam:  Normal osseous mineralization. Joint spaces preserved. No fracture/dislocation/boney destruction.   Assessment: #1 ulceration of the right sub 5th MPJ secondary to diabetes mellitus #2 diabetes mellitus w/ peripheral neuropathy   Plan of Care:  #1 Patient was evaluated. #2 medically necessary excisional debridement including subcutaneous tissue was performed using a tissue nipper and a chisel blade. Excisional debridement of all the necrotic nonviable tissue down to healthy bleeding viable tissue was performed with post-debridement measurements same as pre-. #3 the wound was cleansed and dry sterile dressing applied. #4 offloading pads dispensed. #5 Appointment with [facility] [facility] for DM shoes #6 Once blood glucose is under control and HbA1c is less than 8.0 mg/dL, we will consider surgical exostectomy. Last A1c was 11.0 mg/dL.  #7 Appointment with endocrinology in January 2019. #8 Return  to clinic in 4 weeks.    Felecia ShellingBrent M. Evans, DPM [facility]  Dr. Felecia ShellingBrent M. Evans, DPM    [address]                                        [place], [place] [address]                Office [phone]  Fax [phone]

## 2017-07-20 ENCOUNTER — Encounter (HOSPITAL_BASED_OUTPATIENT_CLINIC_OR_DEPARTMENT_OTHER): Payer: No Typology Code available for payment source

## 2017-08-01 ENCOUNTER — Other Ambulatory Visit: Payer: No Typology Code available for payment source

## 2017-08-03 ENCOUNTER — Other Ambulatory Visit: Payer: No Typology Code available for payment source | Admitting: Orthotics

## 2017-08-03 ENCOUNTER — Other Ambulatory Visit: Payer: Self-pay | Admitting: Podiatry

## 2017-08-03 DIAGNOSIS — L97522 Non-pressure chronic ulcer of other part of left foot with fat layer exposed: Secondary | ICD-10-CM

## 2017-08-06 ENCOUNTER — Ambulatory Visit: Payer: Self-pay | Admitting: Neurology

## 2017-08-07 ENCOUNTER — Other Ambulatory Visit (HOSPITAL_COMMUNITY): Payer: Self-pay | Admitting: Adult Health

## 2017-08-08 ENCOUNTER — Other Ambulatory Visit: Payer: Self-pay | Admitting: Emergency Medicine

## 2017-08-08 MED ORDER — FLUTICASONE PROPIONATE 50 MCG/ACT NA SUSP: 2.0000 | Freq: Every day | NASAL | 0 refills | Status: DC

## 2017-08-08 MED ORDER — SACUBITRIL-VALSARTAN 49-51 MG PO TABS: 1.0000 | ORAL_TABLET | Freq: Two times a day (BID) | ORAL | 3 refills | Status: DC

## 2017-08-08 MED ORDER — CETIRIZINE HCL 10 MG PO TABS: 10.0000 mg | ORAL_TABLET | Freq: Every day | ORAL | 0 refills | Status: DC

## 2017-08-15 ENCOUNTER — Encounter: Payer: Self-pay | Admitting: Podiatry

## 2017-08-15 ENCOUNTER — Ambulatory Visit (INDEPENDENT_AMBULATORY_CARE_PROVIDER_SITE_OTHER): Payer: No Typology Code available for payment source | Admitting: Podiatry

## 2017-08-15 DIAGNOSIS — E0843 Diabetes mellitus due to underlying condition with diabetic autonomic (poly)neuropathy: Secondary | ICD-10-CM

## 2017-08-15 DIAGNOSIS — L97512 Non-pressure chronic ulcer of other part of right foot with fat layer exposed: Secondary | ICD-10-CM

## 2017-08-15 DIAGNOSIS — I70235 Atherosclerosis of native arteries of right leg with ulceration of other part of foot: Secondary | ICD-10-CM

## 2017-08-16 ENCOUNTER — Telehealth: Payer: Self-pay | Admitting: Podiatry

## 2017-08-16 NOTE — Telephone Encounter (Signed)
called insurance and pts plan does not cover diabetic shoes and inserts... Called pt and he was upset. I did offer him a cash pay option of 250.00 for 1 pair of shoes and 1 pair of inserts..he is thinking about it and will let me know.

## 2017-08-19 NOTE — Progress Notes (Signed)
Subjective:  47 year old male with PMHx of DM presents to the clinic for follow-up evaluation of an ulceration to the right sub-fifth MPJ.  He reports applying lidocaine cream to the wound.  He has not seen endocrinology yet.  Patient is here for further evaluation and treatment.    Past Medical History:  Diagnosis Date  . Bipolar disorder (HCC)   . CHF (congestive heart failure) (HCC)   . Chronic systolic heart failure (HCC) 11/2010   a. NICM 4/14 EF 20-25%, TR mild  . CVA (cerebral infarction)    No residual deficits  . Depression    PTSD,   . Diabetes mellitus    TYPE II; UNCONTROLLED BY HEMOGLOBIN A1c; STABLE AS  PER DISCHARGE  . Headache(784.0)   . 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 History of colonic polyps   . Hyperlipidemia   . Hypertension   . Myocardial infarction (HCC) 1987   (while playing football)  . Non-ischemic cardiomyopathy (HCC)    No ischemia on myoview, showed EF of 42%. 2D echo showed EF 50-55% with diatolic dysfunction in 2009  . Obesity   . OSA (obstructive sleep apnea)    Being set up again for c-pap  . Pneumonia   . Post-cardiac injury syndrome (HCC)    History of cardiac injury from blunt trauma  . PVCs (premature ventricular contractions)   . Schizophrenia (HCC)    Goes to [facility]  . SOB (shortness of breath)   . Stroke [facility]) 2005   some left side weakness  . Syncope    Recurrent, thought to be vasovagal. Also has h/o frequent PVCs.       Objective/Physical Exam General: The patient is alert and oriented x3 in no acute distress.  Dermatology:  Wound #1 noted to the right sub 5th MPJ measuring approximately 0.4 x 0.4 x 0.1 cm (LxWxD).   To the noted ulceration(s), there is no eschar. There is a moderate amount of slough, fibrin, and necrotic tissue noted. Granulation tissue and wound base is red. There is a minimal amount of serosanguineous drainage noted. There is no exposed bone muscle-tendon ligament or joint.  There is no malodor. Periwound integrity is intact. Skin is warm, dry and supple bilateral lower extremities.  Vascular: Palpable pedal pulses bilaterally. No edema or erythema noted. Capillary refill within normal limits.  Neurological: Epicritic and protective threshold absent bilaterally.   Musculoskeletal Exam: Range of motion within normal limits to all pedal and ankle joints bilateral. Muscle strength 5/5 in all groups bilateral.   Assessment: #1 ulceration of the right sub 5th MPJ secondary to diabetes mellitus #2 diabetes mellitus w/ peripheral neuropathy   Plan of Care:  #1 Patient was evaluated. #2 medically necessary excisional debridement including subcutaneous tissue was performed using a tissue nipper and a chisel blade. Excisional debridement of all the necrotic nonviable tissue down to healthy bleeding viable tissue was performed with post-debridement measurements same as pre-. #3 the wound was cleansed and dry sterile dressing applied. #4 Continue daily dressings at home. #5 follow-up with orthotics department to see about getting approved for DM shoes. #6 Patient still trying to get an appointment with endocrinology. #7 return to clinic in 4 weeks.  Felecia Shelling, DPM [facility]  Dr. Felecia Shelling, DPM    [address]  [place], [place] [address]                Office [phone]  Fax [phone]

## 2017-09-05 ENCOUNTER — Other Ambulatory Visit: Payer: Self-pay

## 2017-09-05 ENCOUNTER — Emergency Department (HOSPITAL_BASED_OUTPATIENT_CLINIC_OR_DEPARTMENT_OTHER)
Admission: EM | Admit: 2017-09-05 | Discharge: 2017-09-05 | Disposition: A | Discharge status: Home / Self Care | Payer: No Typology Code available for payment source | Attending: Physician Assistant | Admitting: Physician Assistant

## 2017-09-05 ENCOUNTER — Encounter (HOSPITAL_BASED_OUTPATIENT_CLINIC_OR_DEPARTMENT_OTHER): Payer: Self-pay

## 2017-09-05 ENCOUNTER — Emergency Department (HOSPITAL_BASED_OUTPATIENT_CLINIC_OR_DEPARTMENT_OTHER): Payer: No Typology Code available for payment source

## 2017-09-05 DIAGNOSIS — M25512 Pain in left shoulder: Secondary | ICD-10-CM | POA: Insufficient documentation

## 2017-09-05 DIAGNOSIS — E119 Type 2 diabetes mellitus without complications: Secondary | ICD-10-CM | POA: Diagnosis not present

## 2017-09-05 DIAGNOSIS — Z7902 Long term (current) use of antithrombotics/antiplatelets: Secondary | ICD-10-CM | POA: Insufficient documentation

## 2017-09-05 DIAGNOSIS — Z79899 Other long term (current) drug therapy: Secondary | ICD-10-CM | POA: Insufficient documentation

## 2017-09-05 DIAGNOSIS — I110 Hypertensive heart disease with heart failure: Secondary | ICD-10-CM | POA: Diagnosis not present

## 2017-09-05 DIAGNOSIS — Z794 Long term (current) use of insulin: Secondary | ICD-10-CM | POA: Diagnosis not present

## 2017-09-05 DIAGNOSIS — I5041 Acute combined systolic (congestive) and diastolic (congestive) heart failure: Secondary | ICD-10-CM | POA: Diagnosis not present

## 2017-09-05 NOTE — ED Triage Notes (Signed)
C/o left shoulder pain x "months"-denies known injury-states he is unable to fully lift arm and pain is worse with movement-NAD-steady gait

## 2017-09-05 NOTE — Discharge Instructions (Signed)
X-ray shows a little bit of arthritis in the left shoulder.  This may be contributing to your discomfort.  Please follow-up with your primary care to get in for physical therapy.  You can also follow-up with his orthopedic specialist that we have provided.  Use ice and heat as well as ibuprofen (with a full stomach)

## 2017-09-05 NOTE — ED Provider Notes (Signed)
[facility] EMERGENCY DEPARTMENT Provider Note   CSN: [id number] Arrival date & time: 09/05/17  1310     History   Chief Complaint Chief Complaint  Patient presents with  . Shoulder Pain    HPI JENTRY WARNELL is a 47 y.o. male.  HPI   Patient is a 47 year old male presenting with left shoulder pain.  He reports that it happens hurts often at night.  He says is been going on for weeks.  It is a little bit stiff.  He tries to readjust himself and it feels better.  No numbness no tingling no weakness.  Past Medical History:  Diagnosis Date  . Bipolar disorder (HCC)   . CHF (congestive heart failure) (HCC)   . Chronic systolic heart failure (HCC) 11/2010   a. NICM 4/14 EF 20-25%, TR mild  . CVA (cerebral infarction)    No residual deficits  . Depression    PTSD,   . Diabetes mellitus    TYPE II; UNCONTROLLED BY HEMOGLOBIN A1c; STABLE AS  PER DISCHARGE  . Headache(784.0)   . 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 History of colonic polyps   . Hyperlipidemia   . Hypertension   . Myocardial infarction (HCC) 1987   (while playing football)  . Non-ischemic cardiomyopathy (HCC)    No ischemia on myoview, showed EF of 42%. 2D echo showed EF 50-55% with diatolic dysfunction in 2009  . Obesity   . OSA (obstructive sleep apnea)    Being set up again for c-pap  . Pneumonia   . Post-cardiac injury syndrome (HCC)    History of cardiac injury from blunt trauma  . PVCs (premature ventricular contractions)   . Schizophrenia (HCC)    Goes to [facility]  . SOB (shortness of breath)   . Stroke [facility]) 2005   some left side weakness  . Syncope    Recurrent, thought to be vasovagal. Also has h/o frequent PVCs.     Patient Active Problem List   Diagnosis Date Noted  . Weakness 05/08/2017  . Acute kidney injury (HCC) 05/08/2017  . Leukocytosis 05/08/2017  . Stroke (HCC) 05/08/2017  . Slurred speech 05/08/2017  . Acute combined systolic and diastolic  congestive heart failure (HCC)   . Noncompliance 01/22/2014  . Acute respiratory failure with hypoxia (HCC) 11/25/2013  . Chronic systolic heart failure (HCC) 10/02/2011  . Erectile dysfunction 10/02/2011  . Acute on chronic systolic heart failure (HCC) 09/23/2011  . Obstructive sleep apnea 10/11/2007  . Diabetes mellitus with complication (HCC) 10/10/2007  . HLD (hyperlipidemia) 10/10/2007  . HYPOKALEMIA 10/10/2007  . OBESITY 10/10/2007  . Essential hypertension 10/10/2007  . PREMATURE VENTRICULAR CONTRACTIONS 10/10/2007  . SYNCOPE 10/10/2007  . COLONIC POLYPS, HX OF 10/10/2007    Past Surgical History:  Procedure Laterality Date  . CARDIAC CATHETERIZATION  12/19/10   DIFFUSE NONOBSTRUCTIVE CAD; NONISCHEMIC CARDIOMYOPATHY; LEFT VENTRICULAR ANGIOGRAM WAS PERFORMED SECONDARY TO  ELEVATED LEFT VENTRICULAR FILLING PRESSURES  . COLONOSCOPY W/ POLYPECTOMY    . MULTIPLE EXTRACTIONS WITH ALVEOLOPLASTY  01/27/2014   "all my teeth; 4 Quadrants of alveoloplasty  . MULTIPLE EXTRACTIONS WITH ALVEOLOPLASTY N/A 01/27/2014   Procedure: EXTRACTION OF TEETH #'1, 2, 3, 4, 5, 6, 7, 8, 9, 10, 11, 12, 13, 14, 15, 16, 17, 20, 21, 22, 23, 24, 25, 26, 27, 28, 29, 31 and 32 WITH ALVEOLOPLASTY;  Surgeon: Charlynne Pander, DDS;  Location: [facility] OR;  Service: Oral Surgery;  Laterality: N/A;  . ORIF  FINGER / THUMB FRACTURE Right        Home Medications    Prior to Admission medications   Medication Sig Start Date End Date Taking? Authorizing Provider  aspirin 81 MG EC tablet Take 1 tablet (81 mg total) by mouth daily. Patient not taking: Reported on 07/16/2017 05/23/17   [name], Gwenlyn Found, MD  atorvastatin (LIPITOR) 40 MG tablet Take 2 tablets (80 mg total) by mouth at bedtime. 05/23/17   [name], Gwenlyn Found, MD  carvedilol (COREG) 6.25 MG tablet Take 1 tablet (6.25 mg total) by mouth 2 (two) times daily with a meal. 10/02/16   [name], Gwenlyn Found, MD  cetirizine (ZYRTEC ALLERGY) 10 MG tablet Take 1 tablet (10 mg  total) by mouth daily. 08/08/17   [name], Gwenlyn Found, MD  clopidogrel (PLAVIX) 75 MG tablet Take 1 tablet (75 mg total) by mouth daily. 05/23/17   [name], Gwenlyn Found, MD  digoxin (LANOXIN) 0.125 MG tablet Take 1 tablet (0.125 mg total) by mouth daily. 10/02/16   [name], Gwenlyn Found, MD  fluticasone (FLONASE) 50 MCG/ACT nasal spray Place 2 sprays into both nostrils daily. 08/08/17   [name], Gwenlyn Found, MD  furosemide (LASIX) 40 MG tablet Take 1.5 tablets (60 mg total) by mouth daily. 10/02/16   [name], Gwenlyn Found, MD  glucose blood test strip Pt uses one touch.  Test BID 11/02/16   [name], Gwenlyn Found, MD  insulin NPH-regular Human (NOVOLIN 70/30) (70-30) 100 UNIT/ML injection Inject 45 Units into the skin 2 (two) times daily. 03/13/17   [name], Gwenlyn Found, MD  Lancets 28G MISC Use for glucose testing up to BID 11/02/16   [name], Gwenlyn Found, MD  Potassium Chloride ER 20 MEQ TBCR Take 20 mEq by mouth daily. 10/02/16   [name], Gwenlyn Found, MD  sacubitril-valsartan (ENTRESTO) 49-51 MG Take 1 tablet by mouth 2 (two) times daily. 08/08/17   [name], Gwenlyn Found, MD    Family History Family History  Problem Relation Age of Onset  . Heart disease Mother        MI  . Heart failure Mother   . Diabetes Mother        ALSO IN MOST OF HIS SIBLINGS; 2 UNLCES HAVE ALSO PASSED AWAY FROM DM  . Cardiomyopathy Mother   . Cancer - Ovarian Mother   . Heart disease Father   . Hypertension Father   . Diabetes Father     Social History Social History   Tobacco Use  . Smoking status: Never Smoker  . Smokeless tobacco: Never Used  Substance Use Topics  . Alcohol use: No  . Drug use: No     Allergies   Nsaids   Review of Systems Review of Systems  Constitutional: Negative for activity change.  Respiratory: Negative for shortness of breath.   Cardiovascular: Negative for chest pain.  Gastrointestinal: Negative for abdominal pain.     Physical Exam Updated Vital Signs BP (!) 164/103 (BP Location: Right Arm)    Pulse 87   Temp 98.4 F (36.9 C) (Oral)   Resp 20   Ht 5\' 11"  (1.803 m)   Wt 127.9 kg (282 lb)   SpO2 99%   BMI 39.33 kg/m   Physical Exam  Constitutional: He is oriented to person, place, and time. He appears well-nourished.  HENT:  Head: Normocephalic.  Eyes: Conjunctivae are normal.  Cardiovascular: Normal rate and regular rhythm.  No murmur heard. Pulmonary/Chest: Effort normal and breath sounds normal. No respiratory distress.  Musculoskeletal:  Left shoulder  has good range of motion.  No numbness no tingling.  Good strength.  Neurological: He is oriented to person, place, and time.  Skin: Skin is warm and dry. He is not diaphoretic.  Psychiatric: He has a normal mood and affect. His behavior is normal.     ED Treatments / Results  Labs (all labs ordered are listed, but only abnormal results are displayed) Labs Reviewed - No data to display  EKG  EKG Interpretation None       Radiology Dg Shoulder Left  Result Date: 09/05/2017 CLINICAL DATA:  Left shoulder pain down to the elbow for 2 months, decreased range of motion, no injury. EXAM: LEFT SHOULDER - 2+ VIEW COMPARISON:  08/27/2014. FINDINGS: Glenohumeral joint is intact. Mild to moderate degenerative changes in the left acromioclavicular joint. Visualized portion of the left chest is unremarkable. IMPRESSION: 1. No acute findings. 2. Left acromioclavicular joint osteoarthritis. Electronically Signed   By: Leanna Battles M.D.   On: 09/05/2017 13:57    Procedures Procedures (including critical care time)  Medications Ordered in ED Medications - No data to display   Initial Impression / Assessment and Plan / ED Course  I have reviewed the triage vital signs and the nursing notes.  Pertinent labs & imaging results that were available during my care of the patient were reviewed by me and considered in my medical decision making (see chart for details).     This sounds very muscular skeletal in nature.  Does  not sound cardiac.  It is worse at night.  Worse with movement.  It will recommend that he does physical therapy for the left shoulder.  We will have him follow-up with his primary care physician.   Final Clinical Impressions(s) / ED Diagnoses   Final diagnoses:  Pain in shoulder region, left    ED Discharge Orders    None       Kebra Lowrimore, Cindee Salt, MD 09/05/17 1558

## 2017-09-11 NOTE — Progress Notes (Addendum)
[facility] [address] [place], [place] [address] [phone]  Date:  09/13/2017   Name:  Mitchell [name]   DOB:  [birth year]   MRN:  [id number]  PCP:  Pearline Cables, MD    Chief Complaint: No chief complaint on file.   History of Present Illness:  Mitchell [name] is a 47 y.o. very pleasant male patient who presents with the following:  Hospital follow-up today- he was in the local ER on 2/6 with shoulder pain felt to be MSK in origin.  However he is actually following up from an out of state hospital stay from 2/9 to 2/10 I last saw him in October after he was admitted with suspicion of CVA- MRI negative History of mental illness and poorly controlled DM  He had gone up to [place] for a family funeral, he apparently had cardiac arrest, received CPR at the scene and was taken to [facility].  They thought he had "a mild stroke" per pt report He was admitted for 2 nights he thinks - he cannot quite remember.  He has a pt discharge summary with him but it is not that helpful- will obtain hospital records  He was told to come in and see his PCP when he got back to town. They were also concerned about his heart per pt report.  However unfortunately I do not have any more details about what happened when he was admitted  He is a pt of Dr. Teressa Lower, last seen in CHF clinic about a year ago- this note does mention CHF with possible referral for ICD  He is seeing his endocrinologist this afternoon and will discuss his poorly controlled DM  He has not had any numbness or weakness of his body except as he feels is due to MSK left shoulder pain.  No difficulty with speaking or swallowing.  He reports that while he was in the hospital he had some facial weakness but his is now resolved - however on exam he still has right sided facial weakness.   No chest pain Lab Results  Component Value Date   HGBA1C 13.9  09/13/2017    Needs a1c Foot exam due Eye exam: done last month Urine micro due  Patient Active Problem List   Diagnosis Date Noted  . Weakness 05/08/2017  . Acute kidney injury (HCC) 05/08/2017  . Leukocytosis 05/08/2017  . Stroke (HCC) 05/08/2017  . Slurred speech 05/08/2017  . Acute combined systolic and diastolic congestive heart failure (HCC)   . Noncompliance 01/22/2014  . Acute respiratory failure with hypoxia (HCC) 11/25/2013  . Chronic systolic heart failure (HCC) 10/02/2011  . Erectile dysfunction 10/02/2011  . Acute on chronic systolic heart failure (HCC) 09/23/2011  . Obstructive sleep apnea 10/11/2007  . Diabetes mellitus with complication (HCC) 10/10/2007  . HLD (hyperlipidemia) 10/10/2007  . HYPOKALEMIA 10/10/2007  . OBESITY 10/10/2007  . Essential hypertension 10/10/2007  . PREMATURE VENTRICULAR CONTRACTIONS 10/10/2007  . SYNCOPE 10/10/2007  . COLONIC POLYPS, HX OF 10/10/2007    Past Medical History:  Diagnosis Date  . Bipolar disorder (HCC)   . CHF (congestive heart failure) (HCC)   . Chronic systolic heart failure (HCC) 11/2010   a. NICM 4/14 EF 20-25%, TR mild  . CVA (cerebral infarction)    No residual deficits  . Depression    PTSD,   . Diabetes mellitus    TYPE II; UNCONTROLLED BY HEMOGLOBIN  A1c; STABLE AS  PER DISCHARGE  . Headache(784.0)   . 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 History of colonic polyps   . Hyperlipidemia   . Hypertension   . Myocardial infarction (HCC) 1987   (while playing football)  . Non-ischemic cardiomyopathy (HCC)    No ischemia on myoview, showed EF of 42%. 2D echo showed EF 50-55% with diatolic dysfunction in 2009  . Obesity   . OSA (obstructive sleep apnea)    Being set up again for c-pap  . Pneumonia   . Post-cardiac injury syndrome (HCC)    History of cardiac injury from blunt trauma  . PVCs (premature ventricular contractions)   . Schizophrenia (HCC)    Goes to [facility]  . SOB  (shortness of breath)   . Stroke [facility]) 2005   some left side weakness  . Syncope    Recurrent, thought to be vasovagal. Also has h/o frequent PVCs.     Past Surgical History:  Procedure Laterality Date  . CARDIAC CATHETERIZATION  12/19/10   DIFFUSE NONOBSTRUCTIVE CAD; NONISCHEMIC CARDIOMYOPATHY; LEFT VENTRICULAR ANGIOGRAM WAS PERFORMED SECONDARY TO  ELEVATED LEFT VENTRICULAR FILLING PRESSURES  . COLONOSCOPY W/ POLYPECTOMY    . MULTIPLE EXTRACTIONS WITH ALVEOLOPLASTY  01/27/2014   "all my teeth; 4 Quadrants of alveoloplasty  . MULTIPLE EXTRACTIONS WITH ALVEOLOPLASTY N/A 01/27/2014   Procedure: EXTRACTION OF TEETH #'1, 2, 3, 4, 5, 6, 7, 8, 9, 10, 11, 12, 13, 14, 15, 16, 17, 20, 21, 22, 23, 24, 25, 26, 27, 28, 29, 31 and 32 WITH ALVEOLOPLASTY;  Surgeon: Charlynne Pander, DDS;  Location: [facility] OR;  Service: Oral Surgery;  Laterality: N/A;  . ORIF FINGER / THUMB FRACTURE Right     Social History   Tobacco Use  . Smoking status: Never Smoker  . Smokeless tobacco: Never Used  Substance Use Topics  . Alcohol use: No  . Drug use: No    Family History  Problem Relation Age of Onset  . Heart disease Mother        MI  . Heart failure Mother   . Diabetes Mother        ALSO IN MOST OF HIS SIBLINGS; 2 UNLCES HAVE ALSO PASSED AWAY FROM DM  . Cardiomyopathy Mother   . Cancer - Ovarian Mother   . Heart disease Father   . Hypertension Father   . Diabetes Father     Allergies  Allergen Reactions  . Nsaids Anaphylaxis    Able to tolerate aspirin     Medication list has been reviewed and updated.  Current Outpatient Medications on File Prior to Visit  Medication Sig Dispense Refill  . aspirin 81 MG EC tablet Take 1 tablet (81 mg total) by mouth daily. 90 tablet 3  . atorvastatin (LIPITOR) 40 MG tablet Take 2 tablets (80 mg total) by mouth at bedtime. 60 tablet 11  . carvedilol (COREG) 6.25 MG tablet Take 1 tablet (6.25 mg total) by mouth 2 (two) times daily with a meal. 180 tablet 3  .  clopidogrel (PLAVIX) 75 MG tablet Take 1 tablet (75 mg total) by mouth daily. 30 tablet 11  . digoxin (LANOXIN) 0.125 MG tablet Take 1 tablet (0.125 mg total) by mouth daily. 90 tablet 3  . furosemide (LASIX) 40 MG tablet Take 1.5 tablets (60 mg total) by mouth daily. 180 tablet 3  . glucose blood test strip Pt uses one touch.  Test BID 100 each 12  . Lancets 28G  MISC Use for glucose testing up to BID 100 each 12  . Potassium Chloride ER 20 MEQ TBCR Take 20 mEq by mouth daily. 30 tablet 3  . sacubitril-valsartan (ENTRESTO) 49-51 MG Take 1 tablet by mouth 2 (two) times daily. 60 tablet 3  . insulin NPH-regular Human (NOVOLIN 70/30) (70-30) 100 UNIT/ML injection Inject 60 Units into the skin 2 (two) times daily with a meal. 40 mL 11   No current facility-administered medications on file prior to visit.     Review of Systems:  As per HPI- otherwise negative.   Physical Examination: Vitals:   09/13/17 1142  BP: (!) 152/102  Pulse: 85  Resp: 16  Temp: 98.4 F (36.9 C)  SpO2: 98%   Vitals:   09/13/17 1142  Weight: 287 lb (130.2 kg)  Height: 5' 11.5" (1.816 m)   Body mass index is 39.47 kg/m. Ideal Body Weight: Weight in (lb) to have BMI = 25: 181.4  GEN: WDWN, NAD, Non-toxic, A & O x 3, obese, looks well today HEENT: Atraumatic, Normocephalic. Neck supple. No masses, No LAD.  Bilateral TM wnl, oropharynx normal.  PEERL,EOMI.   Ears and Nose: No external deformity. CV: RRR, No M/G/R. No JVD. No thrill. No extra heart sounds. PULM: CTA B, no wheezes, crackles, rhonchi. No retractions. No resp. distress. No accessory muscle use. ABD: S, NT, ND, +BS. No rebound. No HSM. EXTR: No c/c/e NEURO Normal gait.  PSYCH: Normally interactive. Conversant. Not depressed or anxious appearing.  Calm demeanor.  Right facial weakness is apparent, slight visible droop and he cannot move the right side of his face as well as the left  Otherwise his limb strength and gait seems normal     Assessment and Plan: Type 2 diabetes mellitus without complication, with long-term current use of insulin (HCC) - Plan: Comprehensive metabolic panel, CBC  Allergic rhinitis, unspecified seasonality, unspecified trigger - Plan: cetirizine (ZYRTEC ALLERGY) 10 MG tablet, fluticasone (FLONASE) 50 MCG/ACT nasal spray  History of CVA (cerebrovascular accident)  Facial weakness, post-stroke  Essential hypertension  Following up following a ?cardiac arrest and CVA that occurred in [place] about a week ago.  Myrle is not clear on the details and we do not have records yet Sent stat records request today He is seeing endocrinology later today for his poorly controlled DM Will contact his cardiologist about getting him seem  BP up but he did not take any of his BP meds yet today.  Gave him water and he took BP meds while in clinic- slightly better when seen in endocrinology clinic as below  BP Readings from Last 3 Encounters:  09/13/17 (!) 146/98  09/13/17 (!) 152/102  09/05/17 (!) 164/103    Signed Abbe Amsterdam, MD  Results for orders placed or performed in visit on 09/13/17  Comprehensive metabolic panel  Result Value Ref Range   Sodium 140 135 - 145 mEq/L   Potassium 4.1 3.5 - 5.1 mEq/L   Chloride 102 96 - 112 mEq/L   CO2 32 19 - 32 mEq/L   Glucose, Bld 354 (H) 70 - 99 mg/dL   BUN 16 6 - 23 mg/dL   Creatinine, Ser 6.96 0.40 - 1.50 mg/dL   Total Bilirubin 0.4 0.2 - 1.2 mg/dL   Alkaline Phosphatase 85 39 - 117 U/L   AST 16 0 - 37 U/L   ALT 12 0 - 53 U/L   Total Protein 7.1 6.0 - 8.3 g/dL   Albumin 3.8 3.5 - 5.2 g/dL  Calcium 8.7 8.4 - 10.5 mg/dL   GFR 161.09 >60.45 mL/min  CBC  Result Value Ref Range   WBC 7.8 4.0 - 10.5 K/uL   RBC 4.59 4.22 - 5.81 Mil/uL   Platelets 143.0 (L) 150.0 - 400.0 K/uL   Hemoglobin 13.8 13.0 - 17.0 g/dL   HCT 40.9 81.1 - 91.4 %   MCV 90.2 78.0 - 100.0 fl   MCHC 33.4 30.0 - 36.0 g/dL   RDW 78.2 95.6 - 21.3 %    addnd 2/20- received  records from [facility] in [place], will scan in to record phone number [phone] Summary- He was admitted from 2/9 to 09/09/17 Per notes he had a syncopal episode while speaking at a funeral- bystanders reported no pulse and did chest compressions for about 30 seconds before he came to-  it is not clear if he actually had cardiac arrest.  EMS came and transported him to the hospital. He was monitored overnight and had a CT of his head that showed possible subacute infarct in the right cerebellum. He did not want to stay for MRI and so was discharged to home   Of note he did have an MRI of his brain this last October.  Unsure if another MRI would be helpful at this time   Called pt- he does not have an neurologist at this time.  I will refer him to neurology to follow-up on CVA Will message Dr. Logan Bores- his podiatrist- about getting diabetic shoes for him

## 2017-09-12 ENCOUNTER — Encounter: Payer: Self-pay | Admitting: Podiatry

## 2017-09-12 ENCOUNTER — Ambulatory Visit (INDEPENDENT_AMBULATORY_CARE_PROVIDER_SITE_OTHER): Payer: No Typology Code available for payment source | Admitting: Podiatry

## 2017-09-12 DIAGNOSIS — L97512 Non-pressure chronic ulcer of other part of right foot with fat layer exposed: Secondary | ICD-10-CM | POA: Diagnosis not present

## 2017-09-12 DIAGNOSIS — I70235 Atherosclerosis of native arteries of right leg with ulceration of other part of foot: Secondary | ICD-10-CM

## 2017-09-12 DIAGNOSIS — E0843 Diabetes mellitus due to underlying condition with diabetic autonomic (poly)neuropathy: Secondary | ICD-10-CM

## 2017-09-13 ENCOUNTER — Encounter: Payer: Self-pay | Admitting: Endocrinology

## 2017-09-13 ENCOUNTER — Ambulatory Visit (INDEPENDENT_AMBULATORY_CARE_PROVIDER_SITE_OTHER): Payer: No Typology Code available for payment source | Admitting: Endocrinology

## 2017-09-13 ENCOUNTER — Ambulatory Visit (INDEPENDENT_AMBULATORY_CARE_PROVIDER_SITE_OTHER): Payer: No Typology Code available for payment source | Admitting: Family Medicine

## 2017-09-13 ENCOUNTER — Encounter: Payer: Self-pay | Admitting: Family Medicine

## 2017-09-13 VITALS — BP 152/102 | HR 85 | Temp 98.4°F | Resp 16 | Ht 71.5 in | Wt 287.0 lb

## 2017-09-13 VITALS — BP 146/98 | HR 77 | Wt 286.7 lb

## 2017-09-13 DIAGNOSIS — I10 Essential (primary) hypertension: Secondary | ICD-10-CM | POA: Diagnosis not present

## 2017-09-13 DIAGNOSIS — E119 Type 2 diabetes mellitus without complications: Secondary | ICD-10-CM

## 2017-09-13 DIAGNOSIS — Z794 Long term (current) use of insulin: Secondary | ICD-10-CM

## 2017-09-13 DIAGNOSIS — I69392 Facial weakness following cerebral infarction: Secondary | ICD-10-CM | POA: Diagnosis not present

## 2017-09-13 DIAGNOSIS — E118 Type 2 diabetes mellitus with unspecified complications: Secondary | ICD-10-CM | POA: Diagnosis not present

## 2017-09-13 DIAGNOSIS — J309 Allergic rhinitis, unspecified: Secondary | ICD-10-CM | POA: Diagnosis not present

## 2017-09-13 DIAGNOSIS — Z8673 Personal history of transient ischemic attack (TIA), and cerebral infarction without residual deficits: Secondary | ICD-10-CM

## 2017-09-13 LAB — CBC
HEMATOCRIT: 41.4 % (ref 39.0–52.0)
Hemoglobin: 13.8 g/dL (ref 13.0–17.0)
MCHC: 33.4 g/dL (ref 30.0–36.0)
MCV: 90.2 fl (ref 78.0–100.0)
Platelets: 143 10*3/uL — ABNORMAL LOW (ref 150.0–400.0)
RBC: 4.59 Mil/uL (ref 4.22–5.81)
RDW: 12.7 % (ref 11.5–15.5)
WBC: 7.8 10*3/uL (ref 4.0–10.5)

## 2017-09-13 LAB — COMPREHENSIVE METABOLIC PANEL
ALBUMIN: 3.8 g/dL (ref 3.5–5.2)
ALK PHOS: 85 U/L (ref 39–117)
ALT: 12 U/L (ref 0–53)
AST: 16 U/L (ref 0–37)
BILIRUBIN TOTAL: 0.4 mg/dL (ref 0.2–1.2)
BUN: 16 mg/dL (ref 6–23)
CO2: 32 mEq/L (ref 19–32)
CREATININE: 0.99 mg/dL (ref 0.40–1.50)
Calcium: 8.7 mg/dL (ref 8.4–10.5)
Chloride: 102 mEq/L (ref 96–112)
GFR: 103.29 mL/min (ref >60.00)
Glucose, Bld: 354 mg/dL — ABNORMAL HIGH (ref 70–99)
Potassium: 4.1 mEq/L (ref 3.5–5.1)
SODIUM: 140 meq/L (ref 135–145)
TOTAL PROTEIN: 7.1 g/dL (ref 6.0–8.3)

## 2017-09-13 LAB — POCT GLYCOSYLATED HEMOGLOBIN (HGB A1C): HEMOGLOBIN A1C: 13.9

## 2017-09-13 MED ORDER — FLUTICASONE PROPIONATE 50 MCG/ACT NA SUSP: 2.0000 | Freq: Every day | NASAL | 11 refills | Status: DC

## 2017-09-13 MED ORDER — CETIRIZINE HCL 10 MG PO TABS: 10.0000 mg | ORAL_TABLET | Freq: Every day | ORAL | 11 refills | Status: DC

## 2017-09-13 MED ORDER — INSULIN NPH ISOPHANE & REGULAR (70-30) 100 UNIT/ML ~~LOC~~ SUSP: 60.0000 [IU] | Freq: Two times a day (BID) | SUBCUTANEOUS | 11 refills | Status: DC

## 2017-09-13 NOTE — Patient Instructions (Addendum)
good diet and exercise significantly improve the control of your diabetes.  please let me know if you wish to be referred to a dietician.  high blood sugar is very risky to your health.  you should see an eye doctor and dentist every year.  It is very important to get all recommended vaccinations.  Controlling your blood pressure and cholesterol drastically reduces the damage diabetes does to your body.  Those who smoke should quit.  Please discuss these with your doctor.  check your blood sugar twice a day.  vary the time of day when you check, between before the 3 meals, and at bedtime.  also check if you have symptoms of your blood sugar being too high or too low.  please keep a record of the readings and bring it to your next appointment here (or you can bring the meter itself).  You can write it on any piece of paper.  please call us sooner if your blood sugar goes below 70, or if you have a lot of readings over 200.  Please increase the insulin to 60 units with the first and last meals of the day, whenever those are.  Please call or message [place] next week, to tell us how the blood sugar is doing.   Please come back for a follow-up appointment in 2 weeks.

## 2017-09-13 NOTE — Progress Notes (Signed)
Subjective:    Patient ID: Mitchell [name], male    DOB: 08-30-1970, 47 y.o.   MRN: [id number]  HPI pt is referred by Dr Patsy Lager, for diabetes.  Pt states DM was dx'ed in 1981; he has mild polyneuropathy of the lower extremities; he has associated foot ulcer and CVA; he has been on insulin since 2017; pt says his diet and exercise are not good; he has never had pancreatitis, pancreatic surgery, severe hypoglycemia or DKA.  He says he cannot afford insulin analogs.  He takes 70/30, 45 units BID.  He says he misses 3 doses per week, because a meal is missed.  He has not been checking cbg's--he got a meter this am.  Surgery is planned for the right foot, but he needs improved glycemic control first.    Past Medical History:  Diagnosis Date  . Bipolar disorder (HCC)   . CHF (congestive heart failure) (HCC)   . Chronic systolic heart failure (HCC) 11/2010   a. NICM 4/14 EF 20-25%, TR mild  . CVA (cerebral infarction)    No residual deficits  . Depression    PTSD,   . Diabetes mellitus    TYPE II; UNCONTROLLED BY HEMOGLOBIN A1c; STABLE AS  PER DISCHARGE  . Headache(784.0)   . 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 History of colonic polyps   . Hyperlipidemia   . Hypertension   . Myocardial infarction (HCC) 1987   (while playing football)  . Non-ischemic cardiomyopathy (HCC)    No ischemia on myoview, showed EF of 42%. 2D echo showed EF 50-55% with diatolic dysfunction in 2009  . Obesity   . OSA (obstructive sleep apnea)    Being set up again for c-pap  . Pneumonia   . Post-cardiac injury syndrome (HCC)    History of cardiac injury from blunt trauma  . PVCs (premature ventricular contractions)   . Schizophrenia (HCC)    Goes to [facility]  . SOB (shortness of breath)   . Stroke [facility]) 2005   some left side weakness  . Syncope    Recurrent, thought to be vasovagal. Also has h/o frequent PVCs.     Past Surgical History:  Procedure Laterality Date  . CARDIAC  CATHETERIZATION  12/19/10   DIFFUSE NONOBSTRUCTIVE CAD; NONISCHEMIC CARDIOMYOPATHY; LEFT VENTRICULAR ANGIOGRAM WAS PERFORMED SECONDARY TO  ELEVATED LEFT VENTRICULAR FILLING PRESSURES  . COLONOSCOPY W/ POLYPECTOMY    . MULTIPLE EXTRACTIONS WITH ALVEOLOPLASTY  01/27/2014   "all my teeth; 4 Quadrants of alveoloplasty  . MULTIPLE EXTRACTIONS WITH ALVEOLOPLASTY N/A 01/27/2014   Procedure: EXTRACTION OF TEETH #'1, 2, 3, 4, 5, 6, 7, 8, 9, 10, 11, 12, 13, 14, 15, 16, 17, 20, 21, 22, 23, 24, 25, 26, 27, 28, 29, 31 and 32 WITH ALVEOLOPLASTY;  Surgeon: Charlynne Pander, DDS;  Location: [facility] OR;  Service: Oral Surgery;  Laterality: N/A;  . ORIF FINGER / THUMB FRACTURE Right     Social History   Socioeconomic History  . Marital status: Married    Spouse name: Not on file  . Number of children: 5  . Years of education: Not on file  . Highest education level: Not on file  Social Needs  . Financial resource strain: Not on file  . Food insecurity - worry: Not on file  . Food insecurity - inability: Not on file  . Transportation needs - medical: Not on file  . Transportation needs - non-medical: Not on file  Occupational History  .  Occupation: Mortician  Tobacco Use  . Smoking status: Never Smoker  . Smokeless tobacco: Never Used  Substance and Sexual Activity  . Alcohol use: No  . Drug use: No  . Sexual activity: Not on file  Other Topics Concern  . 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    Current Outpatient Medications on File Prior to Visit  Medication Sig Dispense Refill  . aspirin 81 MG EC tablet Take 1 tablet (81 mg total) by mouth daily. 90 tablet 3  . atorvastatin (LIPITOR) 40 MG tablet Take 2 tablets (80 mg total) by mouth at bedtime. 60 tablet 11  . carvedilol (COREG) 6.25 MG tablet Take 1 tablet (6.25 mg total) by mouth 2 (two) times  daily with a meal. 180 tablet 3  . clopidogrel (PLAVIX) 75 MG tablet Take 1 tablet (75 mg total) by mouth daily. 30 tablet 11  . digoxin (LANOXIN) 0.125 MG tablet Take 1 tablet (0.125 mg total) by mouth daily. 90 tablet 3  . furosemide (LASIX) 40 MG tablet Take 1.5 tablets (60 mg total) by mouth daily. 180 tablet 3  . glucose blood test strip Pt uses one touch.  Test BID 100 each 12  . Lancets 28G MISC Use for glucose testing up to BID 100 each 12  . Potassium Chloride ER 20 MEQ TBCR Take 20 mEq by mouth daily. 30 tablet 3  . sacubitril-valsartan (ENTRESTO) 49-51 MG Take 1 tablet by mouth 2 (two) times daily. 60 tablet 3   No current facility-administered medications on file prior to visit.     Allergies  Allergen Reactions  . Nsaids Anaphylaxis    Able to tolerate aspirin     Family History  Problem Relation Age of Onset  . Heart disease Mother        MI  . Heart failure Mother   . Diabetes Mother        ALSO IN MOST OF HIS SIBLINGS; 2 UNLCES HAVE ALSO PASSED AWAY FROM DM  . Cardiomyopathy Mother   . Cancer - Ovarian Mother   . Heart disease Father   . Hypertension Father   . Diabetes Father     BP (!) 146/98 (BP Location: Right Arm, Patient Position: Sitting, Cuff Size: Normal)   Pulse 77   Wt 286 lb 11.2 oz (130 kg)   SpO2 98%   BMI 39.43 kg/m     Review of Systems denies weight loss, blurry vision, headache, chest pain, sob, n/v, urinary frequency, muscle cramps, excessive diaphoresis, memory loss, depression, cold intolerance, rhinorrhea, and easy bruising.     Objective:   Physical Exam VS: see vs page GEN: no distress HEAD: head: no deformity eyes: no periorbital swelling, no proptosis external nose and ears are normal mouth: no lesion seen NECK: supple, thyroid is not enlarged CHEST WALL: no deformity LUNGS: clear to auscultation CV: reg rate and rhythm, no murmur ABD: abdomen is soft, nontender.  no hepatosplenomegaly.  not distended.  no  hernia MUSCULOSKELETAL: muscle bulk and strength are grossly normal.  no obvious joint swelling.  gait is normal and steady EXTEMITIES: no deformity.  healing ulcer on the lat aspect of the right foot is bandaged.  feet are of normal color and temp.  1+ bilat leg edema.  There is bilateral onychomycosis of the toenails PULSES:  dorsalis pedis intact bilat.  no carotid bruit NEURO:  cn 2-12 grossly intact.   readily moves all 4's.  sensation is intact to touch on the feet SKIN:  Normal texture and temperature.  No rash or suspicious lesion is visible.  Abrasions noted on the ant tibial areas. NODES:  None palpable at the neck.  PSYCH: alert, well-oriented.  Does not appear anxious nor depressed.    Lab Results  Component Value Date   CREATININE 0.99 09/13/2017   BUN 16 09/13/2017   NA 140 09/13/2017   K 4.1 09/13/2017   CL 102 09/13/2017   CO2 32 09/13/2017   Lab Results  Component Value Date   HGBA1C 13.9 09/13/2017   I have reviewed outside records, and summarized: Pt was noted to have severely elevated a1c, and referred here.  Mental illness was noted to be affecting the care of his DM  I personally reviewed electrocardiogram tracing (09/05/17): Indication:  Impression: NSR.  anterior QS complexes.  High voltage Compared to 2018: ST and PVC are resolved     Assessment & Plan:  Insulin-requiring type 2 DM, with CVA: severe exacerbation.  therapy limited by pt's request for least expensive meds.  He declines multiple daily injections.   Foot ulcer: we'll check fructosamine upon return, to see if he can be cleared for surgery.    Patient Instructions  good diet and exercise significantly improve the control of your diabetes.  please let me know if you wish to be referred to a dietician.  high blood sugar is very risky to your health.  you should see an eye doctor and dentist every year.  It is very important to get all recommended vaccinations.  Controlling your blood pressure and  cholesterol drastically reduces the damage diabetes does to your body.  Those who smoke should quit.  Please discuss these with your doctor.  check your blood sugar twice a day.  vary the time of day when you check, between before the 3 meals, and at bedtime.  also check if you have symptoms of your blood sugar being too high or too low.  please keep a record of the readings and bring it to your next appointment here (or you can bring the meter itself).  You can write it on any piece of paper.  please call us sooner if your blood sugar goes below 70, or if you have a lot of readings over 200.  Please increase the insulin to 60 units with the first and last meals of the day, whenever those are.  Please call or message [place] next week, to tell us how the blood sugar is doing.   Please come back for a follow-up appointment in 2 weeks.

## 2017-09-13 NOTE — Patient Instructions (Addendum)
It was good to see you today- we will get records from [place] to find out more about what happened recently I will contact Dr. Kandis Mannan office and ask them to schedule you for a visit We refilled your nasal spray and other allergy pill today  I am going to work on getting diabetic shoes for you  You had a CBC and CMP today but NOT an A1c- your endocrinologist may do this right in the office for you

## 2017-09-16 NOTE — Progress Notes (Signed)
Subjective:  47 year old male with PMHx of DM presents to the clinic for follow-up evaluation of an ulceration to the right sub-fifth MPJ. He states he is doing well. He denies any new complaints at this time. Patient is here for further evaluation and treatment.    Past Medical History:  Diagnosis Date  . Bipolar disorder (HCC)   . CHF (congestive heart failure) (HCC)   . Chronic systolic heart failure (HCC) 11/2010   a. NICM 4/14 EF 20-25%, TR mild  . CVA (cerebral infarction)    No residual deficits  . Depression    PTSD,   . Diabetes mellitus    TYPE II; UNCONTROLLED BY HEMOGLOBIN A1c; STABLE AS  PER DISCHARGE  . Headache(784.0)   . 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 History of colonic polyps   . Hyperlipidemia   . Hypertension   . Myocardial infarction (HCC) 1987   (while playing football)  . Non-ischemic cardiomyopathy (HCC)    No ischemia on myoview, showed EF of 42%. 2D echo showed EF 50-55% with diatolic dysfunction in 2009  . Obesity   . OSA (obstructive sleep apnea)    Being set up again for c-pap  . Pneumonia   . Post-cardiac injury syndrome (HCC)    History of cardiac injury from blunt trauma  . PVCs (premature ventricular contractions)   . Schizophrenia (HCC)    Goes to [facility]  . SOB (shortness of breath)   . Stroke [facility]) 2005   some left side weakness  . Syncope    Recurrent, thought to be vasovagal. Also has h/o frequent PVCs.       Objective/Physical Exam General: The patient is alert and oriented x3 in no acute distress.  Dermatology:  Wound #1 noted to the right sub 5th MPJ measuring approximately 0.2 x 0.2 x 0.1 cm (LxWxD).   To the noted ulceration(s), there is no eschar. There is a moderate amount of slough, fibrin, and necrotic tissue noted. Granulation tissue and wound base is red. There is a minimal amount of serosanguineous drainage noted. There is no exposed bone muscle-tendon ligament or joint. There is no  malodor. Periwound integrity is intact. Skin is warm, dry and supple bilateral lower extremities.  Vascular: Palpable pedal pulses bilaterally. No edema or erythema noted. Capillary refill within normal limits.  Neurological: Epicritic and protective threshold absent bilaterally.   Musculoskeletal Exam: Range of motion within normal limits to all pedal and ankle joints bilateral. Muscle strength 5/5 in all groups bilateral.   Assessment: #1 ulceration of the right sub 5th MPJ secondary to diabetes mellitus - improving #2 diabetes mellitus w/ peripheral neuropathy   Plan of Care:  #1 Patient was evaluated. #2 medically necessary excisional debridement including subcutaneous tissue was performed using a tissue nipper and a chisel blade. Excisional debridement of all the necrotic nonviable tissue down to healthy bleeding viable tissue was performed with post-debridement measurements same as pre-. #3 the wound was cleansed and dry sterile dressing applied. #4 Continue daily dressings at home. #5 Insurance will not cover DM shoes and insoles.  #6 Patient still trying to get an appointment with endocrinology. #7 return to clinic in 4 weeks.  Felecia Shelling, DPM [facility]  Dr. Felecia Shelling, DPM    [address]  [place], [place] [address]                Office [phone]  Fax [phone]

## 2017-09-19 ENCOUNTER — Encounter (HOSPITAL_COMMUNITY): Payer: Self-pay

## 2017-09-19 ENCOUNTER — Ambulatory Visit (HOSPITAL_COMMUNITY)
Admission: RE | Admit: 2017-09-19 | Discharge: 2017-09-19 | Disposition: A | Discharge status: Home / Self Care | Payer: No Typology Code available for payment source | Source: Ambulatory Visit | Attending: Internal Medicine | Admitting: Internal Medicine

## 2017-09-19 VITALS — BP 162/104 | HR 84 | Wt 288.2 lb

## 2017-09-19 DIAGNOSIS — R29810 Facial weakness: Secondary | ICD-10-CM | POA: Diagnosis not present

## 2017-09-19 DIAGNOSIS — Z7982 Long term (current) use of aspirin: Secondary | ICD-10-CM | POA: Insufficient documentation

## 2017-09-19 DIAGNOSIS — I5022 Chronic systolic (congestive) heart failure: Secondary | ICD-10-CM

## 2017-09-19 DIAGNOSIS — I110 Hypertensive heart disease with heart failure: Secondary | ICD-10-CM | POA: Diagnosis present

## 2017-09-19 DIAGNOSIS — E119 Type 2 diabetes mellitus without complications: Secondary | ICD-10-CM | POA: Insufficient documentation

## 2017-09-19 DIAGNOSIS — R55 Syncope and collapse: Secondary | ICD-10-CM | POA: Diagnosis not present

## 2017-09-19 DIAGNOSIS — Z79899 Other long term (current) drug therapy: Secondary | ICD-10-CM | POA: Diagnosis not present

## 2017-09-19 DIAGNOSIS — F209 Schizophrenia, unspecified: Secondary | ICD-10-CM | POA: Insufficient documentation

## 2017-09-19 DIAGNOSIS — I10 Essential (primary) hypertension: Secondary | ICD-10-CM

## 2017-09-19 DIAGNOSIS — Z794 Long term (current) use of insulin: Secondary | ICD-10-CM | POA: Diagnosis not present

## 2017-09-19 DIAGNOSIS — F319 Bipolar disorder, unspecified: Secondary | ICD-10-CM | POA: Diagnosis not present

## 2017-09-19 DIAGNOSIS — E118 Type 2 diabetes mellitus with unspecified complications: Secondary | ICD-10-CM | POA: Diagnosis not present

## 2017-09-19 DIAGNOSIS — Z9119 Patient's noncompliance with other medical treatment and regimen: Secondary | ICD-10-CM

## 2017-09-19 DIAGNOSIS — Z91199 Patient's noncompliance with other medical treatment and regimen due to unspecified reason: Secondary | ICD-10-CM

## 2017-09-19 DIAGNOSIS — G4733 Obstructive sleep apnea (adult) (pediatric): Secondary | ICD-10-CM

## 2017-09-19 DIAGNOSIS — Z8673 Personal history of transient ischemic attack (TIA), and cerebral infarction without residual deficits: Secondary | ICD-10-CM | POA: Insufficient documentation

## 2017-09-19 LAB — COMPREHENSIVE METABOLIC PANEL
ALBUMIN: 3.7 g/dL (ref 3.5–5.0)
ALK PHOS: 93 U/L (ref 38–126)
ALT: 20 U/L (ref 17–63)
AST: 27 U/L (ref 15–41)
Anion gap: 11 (ref 5–15)
BILIRUBIN TOTAL: 1 mg/dL (ref 0.3–1.2)
BUN: 9 mg/dL (ref 6–20)
CALCIUM: 8.9 mg/dL (ref 8.9–10.3)
CO2: 27 mmol/L (ref 22–32)
CREATININE: 0.87 mg/dL (ref 0.61–1.24)
Chloride: 103 mmol/L (ref 101–111)
GFR calc Af Amer: >60 mL/min (ref >60)
GFR calc non Af Amer: >60 mL/min (ref >60)
GLUCOSE: 302 mg/dL — AB (ref 65–99)
Potassium: 4.8 mmol/L (ref 3.5–5.1)
Sodium: 141 mmol/L (ref 135–145)
TOTAL PROTEIN: 7.1 g/dL (ref 6.5–8.1)

## 2017-09-19 LAB — CBC
HCT: 41.6 % (ref 39.0–52.0)
Hemoglobin: 13.9 g/dL (ref 13.0–17.0)
MCH: 30 pg (ref 26.0–34.0)
MCHC: 33.4 g/dL (ref 30.0–36.0)
MCV: 89.7 fL (ref 78.0–100.0)
PLATELETS: 156 10*3/uL (ref 150–400)
RBC: 4.64 MIL/uL (ref 4.22–5.81)
RDW: 12 % (ref 11.5–15.5)
WBC: 8.9 10*3/uL (ref 4.0–10.5)

## 2017-09-19 LAB — PROTIME-INR
INR: 1.01
PROTHROMBIN TIME: 13.2 s (ref 11.4–15.2)

## 2017-09-19 LAB — DIGOXIN LEVEL: Digoxin Level: 0.4 ng/mL — ABNORMAL LOW (ref 0.8–2.0)

## 2017-09-19 MED ORDER — SACUBITRIL-VALSARTAN 97-103 MG PO TABS: 1.0000 | ORAL_TABLET | Freq: Two times a day (BID) | ORAL | 11 refills | Status: DC

## 2017-09-19 NOTE — Addendum Note (Signed)
Addended by: Abbe Amsterdam C on: 09/19/2017 09:07 AM   Modules accepted: Orders

## 2017-09-19 NOTE — Progress Notes (Signed)
Thank you :)

## 2017-09-19 NOTE — H&P (View-Only) (Signed)
Patient ID: Mitchell [name], male   DOB: 07/01/1971, 47 y.o.   MRN: [id number]  PCP: None  M[facility] Health: [facility]  Pulmonologist: dr Clance  HPI:  Mitchell [name] is a 47 y.o. male with multiple medical problems including obesity, uncontrolled hypertension, uncontrolled diabetes, obstructive sleep apnea, bipolar disease/schizophrenia, CVA, nonobstructive coronary disease and systolic congestive heart failure secondary to nonischemic cardiomyopathy.He is S/P multiple tooth extractions 01/27/14.   [facility] to [facility] 6/10 through 01/09/14 with uncontrolled diabetes. Started back on insulin. He was also referred to Dr Kulinski for teeth extractions. Bidil stopped due to suspected syncope. Discharge weight 276 pounds.   Admitted to [facility] 05/2016 with volume overload and possible pneumonia. Treated with antibiotics. He had been off HF meds for a few weeks. Diuresed with IV lasix and HF meds restarted. Discharge weight was 276 pounds.   He presents today for follow up. Last seen in clinic 07/2016.  He states he had been doing well overall until several weeks ago. He was speaking at a funeral in [place] and had syncope. He states there were multiple RNs in attendance who performed CPR. Thought this cannot be confirmed by documentation. Was taken to [facility] in [place], [place]. CT head showed possible subacute infarct in the right cerebellum. Pt refused MRI.  Currently denies SOB or CP. No lightheadedness or dizziness. He has residual R facial droop. Has also been having L shoulder pain, but work up so far has been unremarkable. He denies exertional CP. He is not using CPAP. States most recent testing showed "No signs" of sleep apnea. Denies palpitations or recurrent syncope.   09/23/11: 2D echo checked with reduction in LVEF to 15-20% with mild to moderate mitral valve regurgitation ECHO 10/30/12 EF 20-25%  ECHO 11/22/13 EF 20-25%  ECHO 05/2016: EF 20-25%.   SH: Lives at home with wife and children. Does  not smoke or drink alcohol. FH: Mom had heart diease  Review of systems complete and found to be negative unless listed in HPI.    Past Medical History:  Diagnosis Date  . Bipolar disorder (HCC)   . CHF (congestive heart failure) (HCC)   . Chronic systolic heart failure (HCC) 11/2010   a. NICM 4/14 EF 20-25%, TR mild  . CVA (cerebral infarction)    No residual deficits  . Depression    PTSD,   . Diabetes mellitus    TYPE II; UNCONTROLLED BY HEMOGLOBIN A1c; STABLE AS  PER DISCHARGE  . Headache(784.0)   . 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 History of colonic polyps   . Hyperlipidemia   . Hypertension   . Myocardial infarction (HCC) 1987   (while playing football)  . Non-ischemic cardiomyopathy (HCC)    No ischemia on myoview, showed EF of 42%. 2D echo showed EF 50-55% with diatolic dysfunction in 2009  . Obesity   . OSA (obstructive sleep apnea)    Being set up again for c-pap  . Pneumonia   . Post-cardiac injury syndrome (HCC)    History of cardiac injury from blunt trauma  . PVCs (premature ventricular contractions)   . Schizophrenia (HCC)    Goes to [facility]  . SOB (shortness of breath)   . Stroke (HCC) 2005   some left side weakness  . Syncope    Recurrent, thought to be vasovagal. Also has h/o frequent PVCs.     Current Outpatient Medications  Medication Sig Dispense Refill  . aspirin 81 MG EC tablet   Take 1 tablet (81 mg total) by mouth daily. 90 tablet 3  . atorvastatin (LIPITOR) 40 MG tablet Take 2 tablets (80 mg total) by mouth at bedtime. 60 tablet 11  . carvedilol (COREG) 6.25 MG tablet Take 1 tablet (6.25 mg total) by mouth 2 (two) times daily with a meal. 180 tablet 3  . cetirizine (ZYRTEC ALLERGY) 10 MG tablet Take 1 tablet (10 mg total) by mouth daily. 30 tablet 11  . clopidogrel (PLAVIX) 75 MG tablet Take 1 tablet (75 mg total) by mouth daily. 30 tablet 11  . digoxin (LANOXIN) 0.125 MG tablet Take 1 tablet (0.125 mg total) by mouth  daily. 90 tablet 3  . fluticasone (FLONASE) 50 MCG/ACT nasal spray Place 2 sprays into both nostrils daily. 16 g 11  . furosemide (LASIX) 40 MG tablet Take 1.5 tablets (60 mg total) by mouth daily. 180 tablet 3  . glucose blood test strip Pt uses one touch.  Test BID 100 each 12  . insulin NPH-regular Human (NOVOLIN 70/30) (70-30) 100 UNIT/ML injection Inject 60 Units into the skin 2 (two) times daily with a meal. 40 mL 11  . Lancets 28G MISC Use for glucose testing up to BID 100 each 12  . Potassium Chloride ER 20 MEQ TBCR Take 20 mEq by mouth daily. 30 tablet 3  . sacubitril-valsartan (ENTRESTO) 97-103 MG Take 1 tablet by mouth 2 (two) times daily. 60 tablet 11   No current facility-administered medications for this encounter.     Allergies  Allergen Reactions  . Nsaids Anaphylaxis    Able to tolerate aspirin     PHYSICAL EXAM: Vitals:   09/19/17 1109  BP: (!) 162/104  Pulse: 84  SpO2: 99%  Weight: 288 lb 3.2 oz (130.7 kg)   Wt Readings from Last 3 Encounters:  09/19/17 288 lb 3.2 oz (130.7 kg)  09/13/17 286 lb 11.2 oz (130 kg)  09/13/17 287 lb (130.2 kg)   General: Well appearing. No resp difficulty. HEENT: Normal slight R facial droop Neck: Supple. JVP 5-6. Carotids 2+ bilat; no bruits. No thyromegaly or nodule noted. Cor: PMI laterally displaced. RRR, No M/G/R noted No s3 Lungs: CTAB, normal effort. Abdomen: Obese, soft, non-tender, non-distended, no HSM. No bruits or masses. +BS  Extremities: No cyanosis, clubbing, or rash. R and LLE no edema.  Neuro: Alert & orientedx3, cranial nerves grossly intact except for slight R droop. moves all 4 extremities w/o difficulty. Affect pleasant   EKG 09/07/17, NSR with IVCD QRS 112 ms.  ASSESSMENT & PLAN:  1. CVA - In [place] 2/9 - 09/09/17. CT with subacute R cerebellar changes. Refuses MRI. Will need to consider MRI/MRA prior to ICD implant. Refer to Neurology - + R facial droop on exam.  - Not on AC at this time.  -  Continue ASA and statin.  2. Syncope - Sounds vasovagal in etiology, but with non-specific L arm pain and history of systolic CHF. Will proceed with L/RHC by Dr. Jonnie Kubly.  - Low threshold to place event moitior. Referring to EP for ICD. 3.Chronic Systolic Heart Failure - NICM LHC 2012 Non Obstructive CAD. In the past he has refused ICD, but with recent changes now willing.  - Most recent ECHO 05/2016 EF 20-25%, awaiting records from [place] to see if Repeat Echo done.  - Will need ICD. Will refer to EP for consideration. Plan L/RHC next week.  - Volume status looks OK on exam.  - Continue lasix 60 mg daily. - Continue coreg   6.25 mg BID - Continue digoxin 0.125 mg daily.  - Increase Entresto to 97/103 mg BID.  - Hold off on bidil due to compliance.  - Hold off on spiro with ongoing noncompliance. Can likely consider soon.  - Reinforced fluid restriction to < 2 L daily, sodium restriction to less than 2000 mg daily, and the importance of daily weights.   4. DMII- Uncontrolled- Hgb A1C 13.9 09/13/17 - Following with PCP now.  - Recommend jardiance with HF data and very poorly controlled DM2. 5. OSA- He had sleep study and he is waiting results from Dr Turner.   6. Noncompliance - Reinforced medication compliance.  7. HTN: - Meds as above.   Meds as above. Pt with complicated picture and potential concern for mixed ICM due to risk factors. With atypical CP and syncope, will plan L/RHC next week. Will ask Dr. Coplands office if work-up at [facility] included   Michael Andrew Tillery, PA-C  11:16 AM  Patient seen and examined with the above-signed Advanced Practice Provider and/or Housestaff. I personally reviewed laboratory data, imaging studies and relevant notes. I independently examined the patient and formulated the important aspects of the plan. I have edited the note to reflect any of my changes or salient points. I have personally discussed the plan with the patient and/or family.  Agree  with above. Suspect syncope was vaso-gal but also at risk for arrhythmogenic cause. Low threshold to place monitor. Will proceed with R/L cath to exclude ischemic etiology. Refer to EP for ICD. Will need to consider brain MRI/MRA prior to ICD implant if he will agree. Will refer to Neurology. No driving x 6 months.   Sundae Maners, MD  10:56 PM    

## 2017-09-19 NOTE — Progress Notes (Signed)
Patient ID: Mitchell [name], male   DOB: May 30, 1971, 46 y.o.   MRN: [id number]  PCP: None  Mental Health: [facility]  Pulmonologist: dr Shelle Iron  HPI:  Mitchell [name] is a 47 y.o. male with multiple medical problems including obesity, uncontrolled hypertension, uncontrolled diabetes, obstructive sleep apnea, bipolar disease/schizophrenia, CVA, nonobstructive coronary disease and systolic congestive heart failure secondary to nonischemic cardiomyopathy.He is S/P multiple tooth extractions 01/27/14.   Admitted to [facility] 6/10 through 01/09/14 with uncontrolled diabetes. Started back on insulin. He was also referred to Dr Kristin Bruins for teeth extractions. Bidil stopped due to suspected syncope. Discharge weight 276 pounds.   Admitted to [facility] 05/2016 with volume overload and possible pneumonia. Treated with antibiotics. He had been off HF meds for a few weeks. Diuresed with IV lasix and HF meds restarted. Discharge weight was 276 pounds.   He presents today for follow up. Last seen in clinic 07/2016.  He states he had been doing well overall until several weeks ago. He was speaking at a funeral in [place] and had syncope. He states there were multiple RNs in attendance who performed CPR. Thought this cannot be confirmed by documentation. Was taken to [facility] in [place], [place]. CT head showed possible subacute infarct in the right cerebellum. Pt refused MRI.  Currently denies SOB or CP. No lightheadedness or dizziness. He has residual R facial droop. Has also been having L shoulder pain, but work up so far has been unremarkable. He denies exertional CP. He is not using CPAP. States most recent testing showed "No signs" of sleep apnea. Denies palpitations or recurrent syncope.   09/23/11: 2D echo checked with reduction in LVEF to 15-20% with mild to moderate mitral valve regurgitation ECHO 10/30/12 EF 20-25%  ECHO 11/22/13 EF 20-25%  ECHO 05/2016: EF 20-25%.   SH: Lives at home with wife and children. Does  not smoke or drink alcohol. FH: Mom had heart diease  Review of systems complete and found to be negative unless listed in HPI.    Past Medical History:  Diagnosis Date  . Bipolar disorder (HCC)   . CHF (congestive heart failure) (HCC)   . Chronic systolic heart failure (HCC) 11/2010   a. NICM 4/14 EF 20-25%, TR mild  . CVA (cerebral infarction)    No residual deficits  . Depression    PTSD,   . Diabetes mellitus    TYPE II; UNCONTROLLED BY HEMOGLOBIN A1c; STABLE AS  PER DISCHARGE  . Headache(784.0)   . 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 History of colonic polyps   . Hyperlipidemia   . Hypertension   . Myocardial infarction (HCC) 1987   (while playing football)  . Non-ischemic cardiomyopathy (HCC)    No ischemia on myoview, showed EF of 42%. 2D echo showed EF 50-55% with diatolic dysfunction in 2009  . Obesity   . OSA (obstructive sleep apnea)    Being set up again for c-pap  . Pneumonia   . Post-cardiac injury syndrome (HCC)    History of cardiac injury from blunt trauma  . PVCs (premature ventricular contractions)   . Schizophrenia (HCC)    Goes to [facility]  . SOB (shortness of breath)   . Stroke [facility]) 2005   some left side weakness  . Syncope    Recurrent, thought to be vasovagal. Also has h/o frequent PVCs.     Current Outpatient Medications  Medication Sig Dispense Refill  . aspirin 81 MG EC tablet  Take 1 tablet (81 mg total) by mouth daily. 90 tablet 3  . atorvastatin (LIPITOR) 40 MG tablet Take 2 tablets (80 mg total) by mouth at bedtime. 60 tablet 11  . carvedilol (COREG) 6.25 MG tablet Take 1 tablet (6.25 mg total) by mouth 2 (two) times daily with a meal. 180 tablet 3  . cetirizine (ZYRTEC ALLERGY) 10 MG tablet Take 1 tablet (10 mg total) by mouth daily. 30 tablet 11  . clopidogrel (PLAVIX) 75 MG tablet Take 1 tablet (75 mg total) by mouth daily. 30 tablet 11  . digoxin (LANOXIN) 0.125 MG tablet Take 1 tablet (0.125 mg total) by mouth  daily. 90 tablet 3  . fluticasone (FLONASE) 50 MCG/ACT nasal spray Place 2 sprays into both nostrils daily. 16 g 11  . furosemide (LASIX) 40 MG tablet Take 1.5 tablets (60 mg total) by mouth daily. 180 tablet 3  . glucose blood test strip Pt uses one touch.  Test BID 100 each 12  . insulin NPH-regular Human (NOVOLIN 70/30) (70-30) 100 UNIT/ML injection Inject 60 Units into the skin 2 (two) times daily with a meal. 40 mL 11  . Lancets 28G MISC Use for glucose testing up to BID 100 each 12  . Potassium Chloride ER 20 MEQ TBCR Take 20 mEq by mouth daily. 30 tablet 3  . sacubitril-valsartan (ENTRESTO) 97-103 MG Take 1 tablet by mouth 2 (two) times daily. 60 tablet 11   No current facility-administered medications for this encounter.     Allergies  Allergen Reactions  . Nsaids Anaphylaxis    Able to tolerate aspirin     PHYSICAL EXAM: Vitals:   09/19/17 1109  BP: (!) 162/104  Pulse: 84  SpO2: 99%  Weight: 288 lb 3.2 oz (130.7 kg)   Wt Readings from Last 3 Encounters:  09/19/17 288 lb 3.2 oz (130.7 kg)  09/13/17 286 lb 11.2 oz (130 kg)  09/13/17 287 lb (130.2 kg)   General: Well appearing. No resp difficulty. HEENT: Normal slight R facial droop Neck: Supple. JVP 5-6. Carotids 2+ bilat; no bruits. No thyromegaly or nodule noted. Cor: PMI laterally displaced. RRR, No M/G/R noted No s3 Lungs: CTAB, normal effort. Abdomen: Obese, soft, non-tender, non-distended, no HSM. No bruits or masses. +BS  Extremities: No cyanosis, clubbing, or rash. R and LLE no edema.  Neuro: Alert & orientedx3, cranial nerves grossly intact except for slight R droop. moves all 4 extremities w/o difficulty. Affect pleasant   EKG 09/07/17, NSR with IVCD QRS 112 ms.  ASSESSMENT & PLAN:  1. CVA - In [place] 2/9 - 09/09/17. CT with subacute R cerebellar changes. Refuses MRI. Will need to consider MRI/MRA prior to ICD implant. Refer to Neurology - + R facial droop on exam.  - Not on AC at this time.  -  Continue ASA and statin.  2. Syncope - Sounds vasovagal in etiology, but with non-specific L arm pain and history of systolic CHF. Will proceed with L/RHC by Dr. Gala Romney.  - Low threshold to place event moitior. Referring to EP for ICD. 3.Chronic Systolic Heart Failure - NICM [facility] 2012 Non Obstructive CAD. In the past he has refused ICD, but with recent changes now willing.  - Most recent ECHO 05/2016 EF 20-25%, awaiting records from [place] to see if Repeat Echo done.  - Will need ICD. Will refer to EP for consideration. Plan L/RHC next week.  - Volume status looks OK on exam.  - Continue lasix 60 mg daily. - Continue coreg  6.25 mg BID - Continue digoxin 0.125 mg daily.  - Increase Entresto to 97/103 mg BID.  - Hold off on bidil due to compliance.  - Hold off on spiro with ongoing noncompliance. Can likely consider soon.  - Reinforced fluid restriction to < 2 L daily, sodium restriction to less than 2000 mg daily, and the importance of daily weights.   4. DMII- Uncontrolled- Hgb A1C 13.9 09/13/17 - Following with PCP now.  - Recommend jardiance with HF data and very poorly controlled DM2. 5. OSA- He had sleep study and he is waiting results from Dr Mayford Knife.   6. Noncompliance - Reinforced medication compliance.  7. HTN: - Meds as above.   Meds as above. Pt with complicated picture and potential concern for mixed ICM due to risk factors. With atypical CP and syncope, will plan L/RHC next week. Will ask Dr. Brayton Layman office if work-up at [facility] included   Graciella Freer, PA-C  11:16 AM  Patient seen and examined with the above-signed Advanced Practice Provider and/or Housestaff. I personally reviewed laboratory data, imaging studies and relevant notes. I independently examined the patient and formulated the important aspects of the plan. I have edited the note to reflect any of my changes or salient points. I have personally discussed the plan with the patient and/or family.  Agree  with above. Suspect syncope was vaso-gal but also at risk for arrhythmogenic cause. Low threshold to place monitor. Will proceed with R/L cath to exclude ischemic etiology. Refer to EP for ICD. Will need to consider brain MRI/MRA prior to ICD implant if he will agree. Will refer to Neurology. No driving x 6 months.   Arvilla Meres, MD  10:56 PM

## 2017-09-19 NOTE — Patient Instructions (Addendum)
You have been scheduled for a heart catheterization. Please see instruction sheet for additional details.  Routine lab work today. Will notify you of abnormal results, otherwise no news is good news!  INCREASE Entrsto to 97-103 mg twice daily. Can double up on current 49-51 mg tablets you have at home (Take 2 tabs twice daily). New Rx has been sent to your pharmacy for 97-103 mg tablets (Take 1 tab twice daily).  Will refer you to electrophysiology at [facility]. Address: [address] ([address] Floor), [place], [place] [address]  Phone: [phone] Their office will call you to schedule this appointment.  Follow up with Dr Gala Romney next month.  _______________________________________________________ [place] Code: 9002  Take all medication as prescribed the day of your appointment. Bring all medications with you to your appointment.  Do the following things EVERYDAY: 1) Weigh yourself in the morning before breakfast. Write it down and keep it in a log. 2) Take your medicines as prescribed 3) Eat low salt foods-Limit salt (sodium) to 2000 mg per day.  4) Stay as active as you can everyday 5) Limit all fluids for the day to less than 2 liters

## 2017-09-20 ENCOUNTER — Other Ambulatory Visit (HOSPITAL_COMMUNITY): Payer: Self-pay

## 2017-09-20 DIAGNOSIS — R0600 Dyspnea, unspecified: Secondary | ICD-10-CM

## 2017-09-24 NOTE — Progress Notes (Signed)
Pt referred to [facility] 09/19/17 per PCP notes. Confirmed in Epic.     Casimiro Needle [address]" [place], PA-C 09/24/2017 7:20 AM

## 2017-09-26 ENCOUNTER — Ambulatory Visit (HOSPITAL_COMMUNITY)
Admission: RE | Admit: 2017-09-26 | Discharge: 2017-09-26 | Disposition: A | Discharge status: Home / Self Care | Payer: No Typology Code available for payment source | Source: Ambulatory Visit | Attending: Internal Medicine | Admitting: Internal Medicine

## 2017-09-26 ENCOUNTER — Encounter (HOSPITAL_COMMUNITY)
Admission: RE | Disposition: A | Discharge status: Home / Self Care | Payer: Self-pay | Source: Ambulatory Visit | Attending: Internal Medicine

## 2017-09-26 DIAGNOSIS — F319 Bipolar disorder, unspecified: Secondary | ICD-10-CM | POA: Diagnosis not present

## 2017-09-26 DIAGNOSIS — F4310 Post-traumatic stress disorder, unspecified: Secondary | ICD-10-CM | POA: Diagnosis not present

## 2017-09-26 DIAGNOSIS — R0789 Other chest pain: Secondary | ICD-10-CM | POA: Diagnosis not present

## 2017-09-26 DIAGNOSIS — I110 Hypertensive heart disease with heart failure: Secondary | ICD-10-CM | POA: Insufficient documentation

## 2017-09-26 DIAGNOSIS — I69392 Facial weakness following cerebral infarction: Secondary | ICD-10-CM | POA: Diagnosis not present

## 2017-09-26 DIAGNOSIS — I252 Old myocardial infarction: Secondary | ICD-10-CM | POA: Diagnosis not present

## 2017-09-26 DIAGNOSIS — I255 Ischemic cardiomyopathy: Secondary | ICD-10-CM | POA: Diagnosis not present

## 2017-09-26 DIAGNOSIS — R55 Syncope and collapse: Secondary | ICD-10-CM | POA: Insufficient documentation

## 2017-09-26 DIAGNOSIS — E669 Obesity, unspecified: Secondary | ICD-10-CM | POA: Insufficient documentation

## 2017-09-26 DIAGNOSIS — G4733 Obstructive sleep apnea (adult) (pediatric): Secondary | ICD-10-CM | POA: Diagnosis not present

## 2017-09-26 DIAGNOSIS — R0600 Dyspnea, unspecified: Secondary | ICD-10-CM

## 2017-09-26 DIAGNOSIS — I2510 Atherosclerotic heart disease of native coronary artery without angina pectoris: Secondary | ICD-10-CM | POA: Diagnosis not present

## 2017-09-26 DIAGNOSIS — Z7982 Long term (current) use of aspirin: Secondary | ICD-10-CM | POA: Diagnosis not present

## 2017-09-26 DIAGNOSIS — I69354 Hemiplegia and hemiparesis following cerebral infarction affecting left non-dominant side: Secondary | ICD-10-CM | POA: Diagnosis not present

## 2017-09-26 DIAGNOSIS — Z794 Long term (current) use of insulin: Secondary | ICD-10-CM | POA: Insufficient documentation

## 2017-09-26 DIAGNOSIS — Z9114 Patient's other noncompliance with medication regimen: Secondary | ICD-10-CM | POA: Insufficient documentation

## 2017-09-26 DIAGNOSIS — Z7902 Long term (current) use of antithrombotics/antiplatelets: Secondary | ICD-10-CM | POA: Diagnosis not present

## 2017-09-26 DIAGNOSIS — E1165 Type 2 diabetes mellitus with hyperglycemia: Secondary | ICD-10-CM | POA: Diagnosis not present

## 2017-09-26 DIAGNOSIS — I493 Ventricular premature depolarization: Secondary | ICD-10-CM | POA: Diagnosis not present

## 2017-09-26 DIAGNOSIS — F209 Schizophrenia, unspecified: Secondary | ICD-10-CM | POA: Diagnosis not present

## 2017-09-26 DIAGNOSIS — Z6838 Body mass index (BMI) 38.0-38.9, adult: Secondary | ICD-10-CM | POA: Diagnosis not present

## 2017-09-26 DIAGNOSIS — I5022 Chronic systolic (congestive) heart failure: Secondary | ICD-10-CM | POA: Insufficient documentation

## 2017-09-26 DIAGNOSIS — E785 Hyperlipidemia, unspecified: Secondary | ICD-10-CM | POA: Insufficient documentation

## 2017-09-26 HISTORY — PX: RIGHT/LEFT HEART CATH AND CORONARY ANGIOGRAPHY: CATH118266

## 2017-09-26 LAB — POCT I-STAT 3, VENOUS BLOOD GAS (G3P V)
Acid-Base Excess: 1 mmol/L (ref 0.0–2.0)
Acid-Base Excess: 3 mmol/L — ABNORMAL HIGH (ref 0.0–2.0)
Acid-Base Excess: 2 mmol/L (ref 0.0–2.0)
BICARBONATE: 29.4 mmol/L — AB (ref 20.0–28.0)
Bicarbonate: 26.6 mmol/L (ref 20.0–28.0)
Bicarbonate: 28.4 mmol/L — ABNORMAL HIGH (ref 20.0–28.0)
O2 SAT: 90 %
O2 Saturation: 64 %
O2 Saturation: 66 %
PCO2 VEN: 45 mmHg (ref 44.0–60.0)
PCO2 VEN: 53.3 mmHg (ref 44.0–60.0)
PH VEN: 7.35 (ref 7.250–7.430)
PH VEN: 7.379 (ref 7.250–7.430)
PO2 VEN: 35 mmHg (ref 32.0–45.0)
PO2 VEN: 36 mmHg (ref 32.0–45.0)
PO2 VEN: 61 mmHg — AB (ref 32.0–45.0)
TCO2: 28 mmol/L (ref 22–32)
TCO2: 31 mmol/L (ref 22–32)
TCO2: 30 mmol/L (ref 22–32)
pCO2, Ven: 50.3 mmHg (ref 44.0–60.0)
pH, Ven: 7.359 (ref 7.250–7.430)

## 2017-09-26 LAB — BASIC METABOLIC PANEL
ANION GAP: 12 (ref 5–15)
BUN: 12 mg/dL (ref 6–20)
CALCIUM: 8.8 mg/dL — AB (ref 8.9–10.3)
CO2: 26 mmol/L (ref 22–32)
Chloride: 101 mmol/L (ref 101–111)
Creatinine, Ser: 0.94 mg/dL (ref 0.61–1.24)
GLUCOSE: 255 mg/dL — AB (ref 65–99)
POTASSIUM: 3.5 mmol/L (ref 3.5–5.1)
Sodium: 139 mmol/L (ref 135–145)

## 2017-09-26 LAB — POCT I-STAT 3, ART BLOOD GAS (G3+)
Acid-Base Excess: 1 mmol/L (ref 0.0–2.0)
Bicarbonate: 26.5 mmol/L (ref 20.0–28.0)
O2 Saturation: 91 %
PCO2 ART: 45.6 mmHg (ref 32.0–48.0)
PH ART: 7.372 (ref 7.350–7.450)
TCO2: 28 mmol/L (ref 22–32)
pO2, Arterial: 63 mmHg — ABNORMAL LOW (ref 83.0–108.0)

## 2017-09-26 LAB — GLUCOSE, CAPILLARY: GLUCOSE-CAPILLARY: 229 mg/dL — AB (ref 65–99)

## 2017-09-26 SURGERY — RIGHT/LEFT HEART CATH AND CORONARY ANGIOGRAPHY
Anesthesia: LOCAL

## 2017-09-26 MED ORDER — ASPIRIN 81 MG PO CHEW: 81.0000 mg | CHEWABLE_TABLET | ORAL | Status: DC

## 2017-09-26 MED ORDER — MIDAZOLAM HCL 2 MG/2ML IJ SOLN
INTRAMUSCULAR | Status: DC | PRN
  Administered 2017-09-26: 2 mg via INTRAVENOUS

## 2017-09-26 MED ORDER — VERAPAMIL HCL 2.5 MG/ML IV SOLN
INTRAVENOUS | Status: DC | PRN
  Administered 2017-09-26: 10 mL via INTRA_ARTERIAL

## 2017-09-26 MED ORDER — HEPARIN SODIUM (PORCINE) 1000 UNIT/ML IJ SOLN
INTRAMUSCULAR | Status: DC | PRN
  Administered 2017-09-26: 6000 [IU] via INTRAVENOUS

## 2017-09-26 MED ORDER — HEPARIN (PORCINE) IN NACL 2-0.9 UNIT/ML-% IJ SOLN
INTRAMUSCULAR | Status: AC
  Filled 2017-09-26: qty 1000

## 2017-09-26 MED ORDER — SODIUM CHLORIDE 0.9 % IV SOLN: INTRAVENOUS | Status: DC

## 2017-09-26 MED ORDER — SODIUM CHLORIDE 0.9% FLUSH: 3.0000 mL | INTRAVENOUS | Status: DC | PRN

## 2017-09-26 MED ORDER — HEPARIN (PORCINE) IN NACL 2-0.9 UNIT/ML-% IJ SOLN
INTRAMUSCULAR | Status: AC | PRN
  Administered 2017-09-26 (×2): 500 mL

## 2017-09-26 MED ORDER — MIDAZOLAM HCL 2 MG/2ML IJ SOLN
INTRAMUSCULAR | Status: AC
  Filled 2017-09-26: qty 2

## 2017-09-26 MED ORDER — IOPAMIDOL (ISOVUE-370) INJECTION 76%
INTRAVENOUS | Status: DC | PRN
  Administered 2017-09-26: 65 mL via INTRA_ARTERIAL

## 2017-09-26 MED ORDER — LIDOCAINE HCL 1 % IJ SOLN
INTRAMUSCULAR | Status: AC
  Filled 2017-09-26: qty 20

## 2017-09-26 MED ORDER — SODIUM CHLORIDE 0.9 % IV SOLN
INTRAVENOUS | Status: DC
  Administered 2017-09-26: 10:00:00 via INTRAVENOUS

## 2017-09-26 MED ORDER — IOPAMIDOL (ISOVUE-370) INJECTION 76%
INTRAVENOUS | Status: AC
  Filled 2017-09-26: qty 100

## 2017-09-26 MED ORDER — HEPARIN SODIUM (PORCINE) 1000 UNIT/ML IJ SOLN
INTRAMUSCULAR | Status: AC
  Filled 2017-09-26: qty 1

## 2017-09-26 MED ORDER — FENTANYL CITRATE (PF) 100 MCG/2ML IJ SOLN
INTRAMUSCULAR | Status: AC
  Filled 2017-09-26: qty 2

## 2017-09-26 MED ORDER — SODIUM CHLORIDE 0.9 % IV SOLN: 250.0000 mL | INTRAVENOUS | Status: DC | PRN

## 2017-09-26 MED ORDER — ASPIRIN 81 MG PO CHEW
CHEWABLE_TABLET | ORAL | Status: AC
  Administered 2017-09-26: 81 mg
  Filled 2017-09-26: qty 1

## 2017-09-26 MED ORDER — CLOPIDOGREL BISULFATE 75 MG PO TABS
75.0000 mg | ORAL_TABLET | Freq: Once | ORAL | Status: AC
  Administered 2017-09-26: 75 mg via ORAL

## 2017-09-26 MED ORDER — VERAPAMIL HCL 2.5 MG/ML IV SOLN
INTRAVENOUS | Status: AC
  Filled 2017-09-26: qty 2

## 2017-09-26 MED ORDER — SODIUM CHLORIDE 0.9% FLUSH: 3.0000 mL | Freq: Two times a day (BID) | INTRAVENOUS | Status: DC

## 2017-09-26 MED ORDER — CLOPIDOGREL BISULFATE 75 MG PO TABS
ORAL_TABLET | ORAL | Status: AC
  Administered 2017-09-26: 75 mg via ORAL
  Filled 2017-09-26: qty 1

## 2017-09-26 MED ORDER — LIDOCAINE HCL (PF) 1 % IJ SOLN
INTRAMUSCULAR | Status: DC | PRN
  Administered 2017-09-26: 2 mL via INTRADERMAL

## 2017-09-26 MED ORDER — FENTANYL CITRATE (PF) 100 MCG/2ML IJ SOLN
INTRAMUSCULAR | Status: DC | PRN
  Administered 2017-09-26: 25 ug via INTRAVENOUS

## 2017-09-26 SURGICAL SUPPLY — 11 items

## 2017-09-26 NOTE — Progress Notes (Signed)
Rt brachial sheath removed. Site level 0. Gauze dsg applied.

## 2017-09-26 NOTE — Interval H&P Note (Signed)
History and Physical Interval Note:  09/26/2017 10:58 AM  Mitchell [name]  has presented today for surgery, with the diagnosis of hf and CP  The various methods of treatment have been discussed with the patient and family. After consideration of risks, benefits and other options for treatment, the patient has consented to  Procedure(s): RIGHT/LEFT HEART CATH AND CORONARY ANGIOGRAPHY (N/A) and possible coronary angioplasty as a surgical intervention .  The patient's history has been reviewed, patient examined, no change in status, stable for surgery.  I have reviewed the patient's chart and labs.  Questions were answered to the patient's satisfaction.     Rikki Trosper

## 2017-09-26 NOTE — Discharge Instructions (Signed)

## 2017-09-27 ENCOUNTER — Encounter (HOSPITAL_COMMUNITY): Payer: Self-pay | Admitting: Internal Medicine

## 2017-09-27 ENCOUNTER — Other Ambulatory Visit (HOSPITAL_COMMUNITY): Payer: Self-pay | Admitting: Adult Health

## 2017-09-27 MED FILL — Lidocaine HCl Local Inj 1%: INTRAMUSCULAR | Qty: 20 | Status: AC

## 2017-09-28 ENCOUNTER — Ambulatory Visit: Payer: No Typology Code available for payment source | Admitting: Endocrinology

## 2017-09-28 ENCOUNTER — Encounter: Payer: Self-pay | Admitting: *Deleted

## 2017-09-28 DIAGNOSIS — Z0289 Encounter for other administrative examinations: Secondary | ICD-10-CM

## 2017-10-04 ENCOUNTER — Ambulatory Visit (INDEPENDENT_AMBULATORY_CARE_PROVIDER_SITE_OTHER): Payer: No Typology Code available for payment source | Admitting: Internal Medicine

## 2017-10-04 ENCOUNTER — Encounter: Payer: Self-pay | Admitting: Internal Medicine

## 2017-10-04 VITALS — BP 144/72 | HR 72 | Ht 71.5 in | Wt 292.0 lb

## 2017-10-04 DIAGNOSIS — I428 Other cardiomyopathies: Secondary | ICD-10-CM | POA: Diagnosis not present

## 2017-10-04 DIAGNOSIS — I5022 Chronic systolic (congestive) heart failure: Secondary | ICD-10-CM

## 2017-10-04 DIAGNOSIS — I10 Essential (primary) hypertension: Secondary | ICD-10-CM | POA: Diagnosis not present

## 2017-10-04 NOTE — Patient Instructions (Addendum)
Medication Instructions:  Your physician recommends that you continue on your current medications as directed. Please refer to the Current Medication list given to you today.  Labwork: None ordered.  Testing/Procedures: Your physician has recommended that you have a defibrillator inserted. An implantable cardioverter defibrillator (ICD) is a small device that is placed in your chest or, in rare cases, your abdomen. This device uses electrical pulses or shocks to help control life-threatening, irregular heartbeats that could lead the heart to suddenly stop beating (sudden cardiac arrest). Leads are attached to the ICD that goes into your heart. This is done in the hospital and usually requires an overnight stay. Please see the instruction sheet given to you today for more information.  Follow-Up:  The following days are available for procedures: April 1, 4, 8, 11, 15, 18, 22, 23, 24, and 30 If you decide on a day please give me a call:  Dierdre Highman [phone]  Any Other Special Instructions Will Be Listed Below (If Applicable).  If you need a refill on your cardiac medications before your next appointment, please call your pharmacy.   Cardioverter Defibrillator Implantation An implantable cardioverter defibrillator (ICD) is a small device that is placed under the skin in the chest or abdomen. An ICD consists of a battery, a small computer (pulse generator), and wires (leads) that go into the heart. An ICD is used to detect and correct two types of dangerous irregular heartbeats (arrhythmias):  A rapid heart rhythm (tachycardia).  An arrhythmia in which the lower chambers of the heart (ventricles) contract in an uncoordinated way (fibrillation).  When an ICD detects tachycardia, it sends a low-energy shock to the heart to restore the heartbeat to normal (cardioversion). This signal is usually painless. If cardioversion does not work or if the ICD detects fibrillation, it delivers a  high-energy shock to the heart (defibrillation) to restart the heart. This shock may feel like a strong jolt in the chest. Your health care provider may prescribe an ICD if:  You have had an arrhythmia that originated in the ventricles.  Your heart has been damaged by a disease or heart condition.  Sometimes, ICDs are programmed to act as a device called a pacemaker. Pacemakers can be used to treat a slow heartbeat (bradycardia) or tachycardia by taking over the heart rate with electrical impulses. Tell a health care provider about:  Any allergies you have.  All medicines you are taking, including vitamins, herbs, eye drops, creams, and over-the-counter medicines.  Any problems you or family members have had with anesthetic medicines.  Any blood disorders you have.  Any surgeries you have had.  Any medical conditions you have.  Whether you are pregnant or may be pregnant. What are the risks? Generally, this is a safe procedure. However, problems may occur, including:  Swelling, bleeding, or bruising.  Infection.  Blood clots.  Damage to other structures or organs, such as nerves, blood vessels, or the heart.  Allergic reactions to medicines used during the procedure.  What happens before the procedure? Staying hydrated Follow instructions from your health care provider about hydration, which may include:  Up to 2 hours before the procedure - you may continue to drink clear liquids, such as water, clear fruit juice, black coffee, and plain tea.  Eating and drinking restrictions Follow instructions from your health care provider about eating and drinking, which may include:  8 hours before the procedure - stop eating heavy meals or foods such as meat, fried foods, or fatty  foods.  6 hours before the procedure - stop eating light meals or foods, such as toast or cereal.  6 hours before the procedure - stop drinking milk or drinks that contain milk.  2 hours before the  procedure - stop drinking clear liquids.  Medicine Ask your health care provider about:  Changing or stopping your normal medicines. This is important if you take diabetes medicines or blood thinners.  Taking medicines such as aspirin and ibuprofen. These medicines can thin your blood. Do not take these medicines before your procedure if your doctor tells you not to.  Tests  You may have blood tests.  You may have a test to check the electrical signals in your heart (electrocardiogram, ECG).  You may have imaging tests, such as a chest X-ray. General instructions  For 24 hours before the procedure, stop using products that contain nicotine or tobacco, such as cigarettes and e-cigarettes. If you need help quitting, ask your health care provider.  Plan to have someone take you home from the hospital or clinic.  You may be asked to shower with a germ-killing soap. What happens during the procedure?  To reduce your risk of infection: ? Your health care team will wash or sanitize their hands. ? Your skin will be washed with soap. ? Hair may be removed from the surgical area.  Small monitors will be put on your body. They will be used to check your heart, blood pressure, and oxygen level.  An IV tube will be inserted into one of your veins.  You will be given one or more of the following: ? A medicine to help you relax (sedative). ? A medicine to numb the area (local anesthetic). ? A medicine to make you fall asleep (general anesthetic).  Leads will be guided through a blood vessel into your heart and attached to your heart muscles. Depending on the ICD, the leads may go into one ventricle or they may go into both ventricles and into an upper chamber of the heart. An X-ray machine (fluoroscope) will be usedto help guide the leads.  A small incision will be made to create a deep pocket under your skin.  The pulse generator will be placed into the pocket.  The ICD will be  tested.  The incision will be closed with stitches (sutures), skin glue, or staples.  A bandage (dressing) will be placed over the incision. This procedure may vary among health care providers and hospitals. What happens after the procedure?  Your blood pressure, heart rate, breathing rate, and blood oxygen level will be monitored often until the medicines you were given have worn off.  A chest X-ray will be taken to check that the ICD is in the right place.  You will need to stay in the hospital for 1-2 days so your health care provider can make sure your ICD is working.  Do not drive for 24 hours if you received a sedative. Ask your health care provider when it is safe for you to drive.  You may be given an identification card explaining that you have an ICD. Summary  An implantable cardioverter defibrillator (ICD) is a small device that is placed under the skin in the chest or abdomen. It is used to detect and correct dangerous irregular heartbeats (arrhythmias).  An ICD consists of a battery, a small computer (pulse generator), and wires (leads) that go into the heart.  When an ICD detects rapid heart rhythm (tachycardia), it sends a low-energy  shock to the heart to restore the heartbeat to normal (cardioversion). If cardioversion does not work or if the ICD detects uncoordinated heart contractions (fibrillation), it delivers a high-energy shock to the heart (defibrillation) to restart the heart.  You will need to stay in the hospital for 1-2 days to make sure your ICD is working. This information is not intended to replace advice given to you by your health care provider. Make sure you discuss any questions you have with your health care provider. Document Released: 04/08/2002 Document Revised: 07/26/2016 Document Reviewed: 07/26/2016 Elsevier Interactive Patient Education  2017 [employer].

## 2017-10-04 NOTE — Progress Notes (Signed)
HPI [name] is referred today by Dr. Dorthea Cove to consider ICD insertion. He is a pleasant but overweight 47 yo man with chronic systolic heart failure as well as CAD. He has severe but distal 3 vessel disease. He admits to dietary indiscretion with regard to his DM. He has an EF of 30% by heart cath. He was at a funeral in [place] several weeks ago and had a syncopal episode which actually sounds like autonomic dysfunction. He admits to eating more sweets than he is supposed to. Allergies  Allergen Reactions  . Nsaids Anaphylaxis and Other (See Comments)    Able to tolerate aspirin      Current Outpatient Medications  Medication Sig Dispense Refill  . aspirin 81 MG EC tablet Take 1 tablet (81 mg total) by mouth daily. 90 tablet 3  . atorvastatin (LIPITOR) 40 MG tablet Take 2 tablets (80 mg total) by mouth at bedtime. 60 tablet 11  . carvedilol (COREG) 6.25 MG tablet Take 1 tablet (6.25 mg total) by mouth 2 (two) times daily with a meal. 180 tablet 3  . cetirizine (ZYRTEC ALLERGY) 10 MG tablet Take 1 tablet (10 mg total) by mouth daily. 30 tablet 11  . clopidogrel (PLAVIX) 75 MG tablet Take 1 tablet (75 mg total) by mouth daily. 30 tablet 11  . digoxin (LANOXIN) 0.125 MG tablet Take 1 tablet (0.125 mg total) by mouth daily. 90 tablet 3  . fluticasone (FLONASE) 50 MCG/ACT nasal spray Place 2 sprays into both nostrils daily. 16 g 11  . furosemide (LASIX) 40 MG tablet Take 1.5 tablets (60 mg total) by mouth daily. 180 tablet 3  . glucose blood test strip Pt uses one touch.  Test BID 100 each 12  . insulin NPH-regular Human (NOVOLIN 70/30) (70-30) 100 UNIT/ML injection Inject 60 Units into the skin 2 (two) times daily with a meal. 40 mL 11  . Lancets 28G MISC Use for glucose testing up to BID 100 each 12  . Potassium Chloride ER 20 MEQ TBCR Take 20 mEq by mouth daily. 30 tablet 3  . sacubitril-valsartan (ENTRESTO) 97-103 MG Take 1 tablet by mouth 2 (two) times daily. 60 tablet 11   No current  facility-administered medications for this visit.      Past Medical History:  Diagnosis Date  . Bipolar disorder (HCC)   . CHF (congestive heart failure) (HCC)   . Chronic systolic heart failure (HCC) 11/2010   a. NICM 4/14 EF 20-25%, TR mild  . CVA (cerebral infarction)    No residual deficits  . Depression    PTSD,   . Diabetes mellitus    TYPE II; UNCONTROLLED BY HEMOGLOBIN A1c; STABLE AS  PER DISCHARGE  . Headache(784.0)   . 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 History of colonic polyps   . Hyperlipidemia   . Hypertension   . Myocardial infarction (HCC) 1987   (while playing football)  . Non-ischemic cardiomyopathy (HCC)    No ischemia on myoview, showed EF of 42%. 2D echo showed EF 50-55% with diatolic dysfunction in 2009  . Obesity   . OSA (obstructive sleep apnea)    Being set up again for c-pap  . Pneumonia   . Post-cardiac injury syndrome (HCC)    History of cardiac injury from blunt trauma  . PVCs (premature ventricular contractions)   . Schizophrenia (HCC)    Goes to [facility]  . SOB (shortness of breath)   . Stroke (  HCC) 2005   some left side weakness  . Syncope    Recurrent, thought to be vasovagal. Also has h/o frequent PVCs.     ROS:   All systems reviewed and negative except as noted in the HPI.   Past Surgical History:  Procedure Laterality Date  . CARDIAC CATHETERIZATION  12/19/10   DIFFUSE NONOBSTRUCTIVE CAD; NONISCHEMIC CARDIOMYOPATHY; LEFT VENTRICULAR ANGIOGRAM WAS PERFORMED SECONDARY TO  ELEVATED LEFT VENTRICULAR FILLING PRESSURES  . COLONOSCOPY W/ POLYPECTOMY    . MULTIPLE EXTRACTIONS WITH ALVEOLOPLASTY  01/27/2014   "all my teeth; 4 Quadrants of alveoloplasty  . MULTIPLE EXTRACTIONS WITH ALVEOLOPLASTY N/A 01/27/2014   Procedure: EXTRACTION OF TEETH #'1, 2, 3, 4, 5, 6, 7, 8, 9, 10, 11, 12, 13, 14, 15, 16, 17, 20, 21, 22, 23, 24, 25, 26, 27, 28, 29, 31 and 32 WITH ALVEOLOPLASTY;  Surgeon: Charlynne Pander, DDS;  Location: [facility]  OR;  Service: Oral Surgery;  Laterality: N/A;  . ORIF FINGER / THUMB FRACTURE Right   . RIGHT/LEFT HEART CATH AND CORONARY ANGIOGRAPHY N/A 09/26/2017   Procedure: RIGHT/LEFT HEART CATH AND CORONARY ANGIOGRAPHY;  Surgeon: Dolores Patty, MD;  Location: [facility] INVASIVE CV LAB;  Service: Cardiovascular;  Laterality: N/A;     Family History  Problem Relation Age of Onset  . Heart disease Mother        MI  . Heart failure Mother   . Diabetes Mother        ALSO IN MOST OF HIS SIBLINGS; 2 UNLCES HAVE ALSO PASSED AWAY FROM DM  . Cardiomyopathy Mother   . Cancer - Ovarian Mother   . Heart disease Father   . Hypertension Father   . Diabetes Father      Social History   Socioeconomic History  . Marital status: Married    Spouse name: Not on file  . Number of children: 5  . Years of education: Not on file  . Highest education level: Not on file  Social Needs  . Financial resource strain: Not on file  . Food insecurity - worry: Not on file  . Food insecurity - inability: Not on file  . Transportation needs - medical: Not on file  . Transportation needs - non-medical: Not on file  Occupational History  . Occupation: Mortician  Tobacco Use  . Smoking status: Never Smoker  . Smokeless tobacco: Never Used  Substance and Sexual Activity  . Alcohol use: No  . Drug use: No  . Sexual activity: Not on file  Other Topics Concern  . Not on file  Social History Narrative   MARRIED, LIVES IN [place] WITH WIFE; GREW UP IN [place] AND USED TO BE A COOK; HE ENJOYS COOKING AND ENJOYS EATING A LOT OF PORK AND SALT.     BP (!) 144/72   Pulse 72   Ht 5' 11.5" (1.816 m)   Wt 292 lb (132.5 kg)   BMI 40.16 kg/m   Physical Exam:  Well appearing but overweight 47 tyo man, NAD HEENT: Unremarkable Neck:  7 cm JVD, no thyromegally Lymphatics:  No adenopathy Back:  No CVA tenderness Lungs:  Clear with no wheezes HEART:  Regular rate rhythm, no murmurs, no rubs, no clicks Abd:   soft, positive bowel sounds, no organomegally, no rebound, no guarding Ext:  2 plus pulses, no peripheral edema, no cyanosis, no clubbing Skin:  No rashes no nodules Neuro:  CN II through XII intact, motor grossly intact  ECG - reviewed  DEVICE  Normal device function.  See PaceArt for details.   Assess/Plan: 1. Chronic systolic heart failure - he has 3 vessel CAD but distal disease and he is thought to have mixed disease. I have discussed the treatment options with the patient and his wife and he will call use when he is ready to proceed.  2. DM - he admits to dietary indiscretion. He will work hard to control his glucose prior to device insertion.  3. CAD - he denies angina but has severe disease but cath with DM like distal disease. He will need aggressive treatment with statins and possibly Repatha.  4. Obesity - he is overweight and encouraged to lose weight.  Leonia Reeves.D.

## 2017-10-10 ENCOUNTER — Ambulatory Visit (INDEPENDENT_AMBULATORY_CARE_PROVIDER_SITE_OTHER): Payer: No Typology Code available for payment source | Admitting: Podiatry

## 2017-10-10 ENCOUNTER — Encounter: Payer: Self-pay | Admitting: Podiatry

## 2017-10-10 DIAGNOSIS — L97512 Non-pressure chronic ulcer of other part of right foot with fat layer exposed: Secondary | ICD-10-CM | POA: Diagnosis not present

## 2017-10-10 DIAGNOSIS — E0843 Diabetes mellitus due to underlying condition with diabetic autonomic (poly)neuropathy: Secondary | ICD-10-CM | POA: Diagnosis not present

## 2017-10-10 DIAGNOSIS — I70235 Atherosclerosis of native arteries of right leg with ulceration of other part of foot: Secondary | ICD-10-CM

## 2017-10-11 NOTE — Progress Notes (Signed)
   Subjective:  47 year old male with PMHx of T2DM presents to the clinic for follow-up evaluation of an ulceration to the right sub-fifth MPJ. He reports he is doing well overall. He reports some drainage on the dressing when he changed it last. He denies any new complaints at this time. Patient is here for further evaluation and treatment.    Past Medical History:  Diagnosis Date  . Bipolar disorder (HCC)   . CHF (congestive heart failure) (HCC)   . Chronic systolic heart failure (HCC) 11/2010   a. NICM 4/14 EF 20-25%, TR mild  . CVA (cerebral infarction)    No residual deficits  . Depression    PTSD,   . Diabetes mellitus    TYPE II; UNCONTROLLED BY HEMOGLOBIN A1c; STABLE AS  PER DISCHARGE  . Headache(784.0)   . 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 History of colonic polyps   . Hyperlipidemia   . Hypertension   . Myocardial infarction (HCC) 1987   (while playing football)  . Non-ischemic cardiomyopathy (HCC)    No ischemia on myoview, showed EF of 42%. 2D echo showed EF 50-55% with diatolic dysfunction in 2009  . Obesity   . OSA (obstructive sleep apnea)    Being set up again for c-pap  . Pneumonia   . Post-cardiac injury syndrome (HCC)    History of cardiac injury from blunt trauma  . PVCs (premature ventricular contractions)   . Schizophrenia (HCC)    Goes to [facility]  . SOB (shortness of breath)   . Stroke [facility]) 2005   some left side weakness  . Syncope    Recurrent, thought to be vasovagal. Also has h/o frequent PVCs.       Objective/Physical Exam General: The patient is alert and oriented x3 in no acute distress.  Dermatology:  Wound noted to the right sub-fifth MPJ has healed. Complete re-epithelialization has occurred. No drainage noted. Hyperkeratotic lesion noted to the right sub-fifth MPJ. Pain on palpation with a central nucleated core noted. Skin is warm, dry and supple bilateral lower extremities.  Vascular: Palpable pedal pulses  bilaterally. No edema or erythema noted. Capillary refill within normal limits.  Neurological: Epicritic and protective threshold absent bilaterally.   Musculoskeletal Exam: Range of motion within normal limits to all pedal and ankle joints bilateral. Muscle strength 5/5 in all groups bilateral.   Assessment: #1 ulceration of the right sub 5th MPJ secondary to diabetes mellitus - healed #2 diabetes mellitus w/ peripheral neuropathy   Plan of Care:  #1 Patient was evaluated. #2 Excisional debridement of keratoic lesion using a chisel blade was performed without incident.  #3 Dressed with light dressing. #4 Continue wearing [employer] shoes. Insurance will not cover DM shoes and insoles.  #5 Continue wearing offloading dancer's pads.  #6 Return to clinic in 3 months for routine care.    Felecia Shelling, DPM [facility]  Dr. Felecia Shelling, DPM    [address]                                        [place], [place] [address]                Office [phone]  Fax [phone]

## 2017-10-20 ENCOUNTER — Other Ambulatory Visit: Payer: Self-pay | Admitting: Family Medicine

## 2017-10-20 DIAGNOSIS — I5022 Chronic systolic (congestive) heart failure: Secondary | ICD-10-CM

## 2017-10-20 DIAGNOSIS — I10 Essential (primary) hypertension: Secondary | ICD-10-CM

## 2017-10-23 ENCOUNTER — Encounter (HOSPITAL_COMMUNITY): Payer: Self-pay | Admitting: Internal Medicine

## 2017-10-23 ENCOUNTER — Other Ambulatory Visit: Payer: Self-pay

## 2017-10-23 ENCOUNTER — Ambulatory Visit (HOSPITAL_COMMUNITY)
Admission: RE | Admit: 2017-10-23 | Discharge: 2017-10-23 | Disposition: A | Discharge status: Home / Self Care | Payer: No Typology Code available for payment source | Source: Ambulatory Visit | Attending: Internal Medicine | Admitting: Internal Medicine

## 2017-10-23 VITALS — BP 164/78 | HR 87 | Wt 288.5 lb

## 2017-10-23 DIAGNOSIS — E669 Obesity, unspecified: Secondary | ICD-10-CM | POA: Diagnosis not present

## 2017-10-23 DIAGNOSIS — E118 Type 2 diabetes mellitus with unspecified complications: Secondary | ICD-10-CM | POA: Diagnosis not present

## 2017-10-23 DIAGNOSIS — I2510 Atherosclerotic heart disease of native coronary artery without angina pectoris: Secondary | ICD-10-CM | POA: Diagnosis not present

## 2017-10-23 DIAGNOSIS — F4310 Post-traumatic stress disorder, unspecified: Secondary | ICD-10-CM | POA: Insufficient documentation

## 2017-10-23 DIAGNOSIS — F319 Bipolar disorder, unspecified: Secondary | ICD-10-CM | POA: Diagnosis not present

## 2017-10-23 DIAGNOSIS — Z794 Long term (current) use of insulin: Secondary | ICD-10-CM | POA: Diagnosis not present

## 2017-10-23 DIAGNOSIS — Z7951 Long term (current) use of inhaled steroids: Secondary | ICD-10-CM | POA: Insufficient documentation

## 2017-10-23 DIAGNOSIS — Z8673 Personal history of transient ischemic attack (TIA), and cerebral infarction without residual deficits: Secondary | ICD-10-CM | POA: Insufficient documentation

## 2017-10-23 DIAGNOSIS — I252 Old myocardial infarction: Secondary | ICD-10-CM | POA: Insufficient documentation

## 2017-10-23 DIAGNOSIS — F209 Schizophrenia, unspecified: Secondary | ICD-10-CM | POA: Diagnosis not present

## 2017-10-23 DIAGNOSIS — I5022 Chronic systolic (congestive) heart failure: Secondary | ICD-10-CM | POA: Insufficient documentation

## 2017-10-23 DIAGNOSIS — Z7982 Long term (current) use of aspirin: Secondary | ICD-10-CM | POA: Diagnosis not present

## 2017-10-23 DIAGNOSIS — I493 Ventricular premature depolarization: Secondary | ICD-10-CM | POA: Diagnosis not present

## 2017-10-23 DIAGNOSIS — E785 Hyperlipidemia, unspecified: Secondary | ICD-10-CM | POA: Diagnosis not present

## 2017-10-23 DIAGNOSIS — I428 Other cardiomyopathies: Secondary | ICD-10-CM | POA: Diagnosis not present

## 2017-10-23 DIAGNOSIS — G4733 Obstructive sleep apnea (adult) (pediatric): Secondary | ICD-10-CM | POA: Insufficient documentation

## 2017-10-23 DIAGNOSIS — I110 Hypertensive heart disease with heart failure: Secondary | ICD-10-CM | POA: Insufficient documentation

## 2017-10-23 DIAGNOSIS — R55 Syncope and collapse: Secondary | ICD-10-CM | POA: Insufficient documentation

## 2017-10-23 DIAGNOSIS — I10 Essential (primary) hypertension: Secondary | ICD-10-CM

## 2017-10-23 DIAGNOSIS — E1165 Type 2 diabetes mellitus with hyperglycemia: Secondary | ICD-10-CM | POA: Diagnosis not present

## 2017-10-23 DIAGNOSIS — Z79899 Other long term (current) drug therapy: Secondary | ICD-10-CM | POA: Insufficient documentation

## 2017-10-23 MED ORDER — FUROSEMIDE 40 MG PO TABS: 40.0000 mg | ORAL_TABLET | Freq: Every day | ORAL | 3 refills | Status: DC

## 2017-10-23 MED ORDER — EMPAGLIFLOZIN 10 MG PO TABS: 10.0000 mg | ORAL_TABLET | Freq: Every day | ORAL | 6 refills | Status: DC

## 2017-10-23 NOTE — Progress Notes (Signed)
ADVANCED HF CLINIC NOTE  Patient ID: Mitchell [name], male   DOB: Aug 12, 1970, 47 y.o.   MRN: [id number]  PCP: None  M[facility] Health: [facility]  Pulmonologist: dr Shelle Iron  HPI:  Mitchell [name] is a 47 y.o. male with multiple medical problems including obesity, uncontrolled hypertension, uncontrolled diabetes, obstructive sleep apnea, bipolar disease/schizophrenia, CVA, nonobstructive coronary disease and systolic congestive heart failure secondary to nonischemic cardiomyopathy.He is S/P multiple tooth extractions 01/27/14.   Admitted to [facility] 6/10 through 01/09/14 with uncontrolled diabetes. Started back on insulin. He was also referred to Dr Kristin Bruins for teeth extractions. Bidil stopped due to suspected syncope. Discharge weight 276 pounds.   Admitted to [facility] 05/2016 with volume overload and possible pneumonia. Treated with antibiotics. He had been off HF meds for a few weeks. Diuresed with IV lasix and HF meds restarted. Discharge weight was 276 pounds.   He presents today for follow up. He had a R/LHC on 09/26/17 with 3v disease and preserved CO/CI. Saw EP about ICD. He has been given some dates to choose from and is working to get his blood sugar under control. Stressed today due to issues with transportation. BP elevated, but just took medication. No further presyncope or syncope. Denies CP, SOB, orthopnea, PND, or edema. Not wearing CPAP - says his sleep study was normal. Not weighing at home. Compliant with medications. Limiting fluid and salt intake.   09/23/11: 2D echo checked with reduction in LVEF to 15-20% with mild to moderate mitral valve regurgitation ECHO 10/30/12 EF 20-25%  ECHO 11/22/13 EF 20-25%  ECHO 05/2016: EF 20-25%.  ECHO 04/2017: EF 20-25%, grade 2 DD  RHC 09/26/17:  Findings: RA = 8 RV = 43/10 PA = 43/16 (26) PCW = 11 Fick cardiac output/index = 6.4/2.6 PVR = 2.3 WU Ao sat = 91% PA sat = 64%, 65% Assessment: 1. Diffuse 3v CAD with diabetic vasculopathy. No clear targets for  intervention 2. Ischemic CM EF 30-35% 3. Well-compensated hemodynamics  SH: Lives at home with wife and children. Does not smoke or drink alcohol. FH: Mom had heart diease  Review of systems complete and found to be negative unless listed in HPI.    Past Medical History:  Diagnosis Date  . Bipolar disorder (HCC)   . CHF (congestive heart failure) (HCC)   . Chronic systolic heart failure (HCC) 11/2010   a. NICM 4/14 EF 20-25%, TR mild  . CVA (cerebral infarction)    No residual deficits  . Depression    PTSD,   . Diabetes mellitus    TYPE II; UNCONTROLLED BY HEMOGLOBIN A1c; STABLE AS  PER DISCHARGE  . Headache(784.0)   . 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 History of colonic polyps   . Hyperlipidemia   . Hypertension   . Myocardial infarction (HCC) 1987   (while playing football)  . Non-ischemic cardiomyopathy (HCC)    No ischemia on myoview, showed EF of 42%. 2D echo showed EF 50-55% with diatolic dysfunction in 2009  . Obesity   . OSA (obstructive sleep apnea)    Being set up again for c-pap  . Pneumonia   . Post-cardiac injury syndrome (HCC)    History of cardiac injury from blunt trauma  . PVCs (premature ventricular contractions)   . Schizophrenia (HCC)    Goes to [facility]  . SOB (shortness of breath)   . Stroke [facility]) 2005   some left side weakness  . Syncope    Recurrent, thought to  be vasovagal. Also has h/o frequent PVCs.     Current Outpatient Medications  Medication Sig Dispense Refill  . aspirin 81 MG EC tablet Take 1 tablet (81 mg total) by mouth daily. 90 tablet 3  . atorvastatin (LIPITOR) 40 MG tablet Take 2 tablets (80 mg total) by mouth at bedtime. 60 tablet 11  . carvedilol (COREG) 6.25 MG tablet Take 1 tablet (6.25 mg total) by mouth 2 (two) times daily with a meal. 180 tablet 3  . cetirizine (ZYRTEC ALLERGY) 10 MG tablet Take 1 tablet (10 mg total) by mouth daily. 30 tablet 11  . clopidogrel (PLAVIX) 75 MG tablet Take 1 tablet  (75 mg total) by mouth daily. 30 tablet 11  . digoxin (LANOXIN) 0.125 MG tablet Take 1 tablet (0.125 mg total) by mouth daily. 90 tablet 3  . fluticasone (FLONASE) 50 MCG/ACT nasal spray Place 2 sprays into both nostrils daily. 16 g 11  . furosemide (LASIX) 40 MG tablet Take 1.5 tablets (60 mg total) by mouth daily. 180 tablet 3  . glucose blood test strip Pt uses one touch.  Test BID 100 each 12  . insulin NPH-regular Human (NOVOLIN 70/30) (70-30) 100 UNIT/ML injection Inject 60 Units into the skin 2 (two) times daily with a meal. 40 mL 11  . Lancets 28G MISC Use for glucose testing up to BID 100 each 12  . Potassium Chloride ER 20 MEQ TBCR Take 20 mEq by mouth daily. 30 tablet 3  . sacubitril-valsartan (ENTRESTO) 97-103 MG Take 1 tablet by mouth 2 (two) times daily. 60 tablet 11   No current facility-administered medications for this encounter.     Allergies  Allergen Reactions  . Nsaids Anaphylaxis and Other (See Comments)    Able to tolerate aspirin     PHYSICAL EXAM: Vitals:   10/23/17 1051  BP: (!) 164/78  Pulse: 87  SpO2: 99%  Weight: 288 lb 8 oz (130.9 kg)   Wt Readings from Last 3 Encounters:  10/23/17 288 lb 8 oz (130.9 kg)  10/04/17 292 lb (132.5 kg)  09/26/17 282 lb (127.9 kg)   General: Well appearing. No resp difficulty. HEENT: Normal, slight R facial droop Neck: Supple. JVP 7-8. Carotids 2+ bilat; no bruits. No thyromegaly or nodule noted. Cor: PMI nondisplaced. RRR, No M/G/R noted Lungs: diminished bases.  Abdomen: Soft, non-tender, non-distended, no HSM. No bruits or masses. +BS  Extremities: No cyanosis, clubbing, or rash. R and LLE no edema.  Neuro: Alert & orientedx3, cranial nerves grossly intact. moves all 4 extremities w/o difficulty. Affect pleasant   ASSESSMENT & PLAN:  1.Chronic Systolic Heart Failure - NICM LHC 2012 Non Obstructive CAD. In the past he has refused ICD, but with recent changes now willing. R/LHC 08/2017: diffuse 3v CAD with  diabetic vasculopathy, no targets for intervention; ICM EF 30-35%;  - ECHO 10/18 EF 20-25%, grade 2 DD - RHC 09/26/17 looked good (PCW 11 Fick 6.4/2.6) - Will need ICD. He saw EP earlier this month. He is supposed to call them when he is ready to schedule ICD. He needs to get sugar better controlled. Recent increase in NPH.  - Volume status stable.  - With addition of Jardiance will cut lasix from 60 mg daily to 40mg  daily. Takes extra as needed. - Continue coreg 6.25 mg BID - Continue digoxin 0.125 mg daily.  - Continue Entresto to 97/103 mg BID.  - Will likely need Bidil. Will refer to PharmD Clinic for addition. (Just took BP meds  now so want to see what BP is first) - Hold off on spiro with ongoing noncompliance. Can likely consider soon.  - Reinforced fluid restriction to < 2 L daily, sodium restriction to less than 2000 mg daily, and the importance of daily weights.  2. CAD - recent cath 09/26/17 reviewed 3v CAD (LAD p80, d95, OM1 95%, OM2 95%, RCA 70%, PDA 85%) - no s/s ischemia - Continue ASA, statin  3. CVA - In [place] 2/9 - 09/09/17. CT with subacute R cerebellar changes. MRI report from [place] with no acute or subacute process.  - + R facial droop on exam.  - Not on AC at this time.  - Continue ASA and statin.  - He has follow up with Neuro 4/22 4. Syncope - Sounds vasovagal in etiology, Pending ICD with  - No driving x6 months 5. DMII- Uncontrolled- Hgb A1C 13.9 09/13/17 - Following with PCP now.  - Recommend jardiance with HF data and very poorly controlled DM2. 6. OSA  - No longer has sleep apnea per patient. Not wearing CPAP.  7. HTN: - Elevated today, but just took medications. Does not check at home.    Alford Highland, NP  11:03 AM  Patient seen and examined with the above-signed Advanced Practice Provider and/or Housestaff. I personally reviewed laboratory data, imaging studies and relevant notes. I independently examined the patient and formulated the important  aspects of the plan. I have edited the note to reflect any of my changes or salient points. I have personally discussed the plan with the patient and/or family.  Stable NYHA II. Volume status ok.  Cath results reviewed with him personally. Has severe 3v CAD with diabetic vasculopathy no revascularization option. Currently no s/s ischemia. Will add Jardiance to help with DM and CV risk reduction. Cut lasix back to 40 daily. Pending ICD referral.   Arvilla Meres, MD  11:26 AM

## 2017-10-23 NOTE — Patient Instructions (Signed)
Start Jardiance 10 mg daily  Decrease Furosemide (Lasix) to 40 mg (1 tab) daily, can take extra tab as needed  Your physician recommends that you schedule a follow-up appointment with: Cicero Duck, Pharm D every couple of weeks for 3 visits  Your physician recommends that you schedule a follow-up appointment in: 2 months

## 2017-11-07 ENCOUNTER — Ambulatory Visit (HOSPITAL_COMMUNITY): Payer: No Typology Code available for payment source

## 2017-11-08 ENCOUNTER — Ambulatory Visit: Payer: No Typology Code available for payment source | Admitting: Endocrinology

## 2017-11-08 DIAGNOSIS — Z0289 Encounter for other administrative examinations: Secondary | ICD-10-CM

## 2017-11-19 ENCOUNTER — Ambulatory Visit: Payer: No Typology Code available for payment source | Admitting: Neurology

## 2017-11-21 ENCOUNTER — Inpatient Hospital Stay (HOSPITAL_COMMUNITY): Admission: RE | Admit: 2017-11-21 | Payer: No Typology Code available for payment source | Source: Ambulatory Visit

## 2017-12-05 ENCOUNTER — Ambulatory Visit (HOSPITAL_COMMUNITY): Payer: No Typology Code available for payment source

## 2017-12-12 ENCOUNTER — Encounter (HOSPITAL_COMMUNITY): Payer: Self-pay

## 2017-12-12 ENCOUNTER — Ambulatory Visit (HOSPITAL_COMMUNITY)
Admission: RE | Admit: 2017-12-12 | Discharge: 2017-12-12 | Disposition: A | Discharge status: Home / Self Care | Payer: No Typology Code available for payment source | Source: Ambulatory Visit | Attending: Cardiology | Admitting: Cardiology

## 2017-12-12 VITALS — BP 138/90 | HR 80 | Wt 279.0 lb

## 2017-12-12 DIAGNOSIS — R55 Syncope and collapse: Secondary | ICD-10-CM | POA: Insufficient documentation

## 2017-12-12 DIAGNOSIS — E1165 Type 2 diabetes mellitus with hyperglycemia: Secondary | ICD-10-CM | POA: Insufficient documentation

## 2017-12-12 DIAGNOSIS — I429 Cardiomyopathy, unspecified: Secondary | ICD-10-CM | POA: Insufficient documentation

## 2017-12-12 DIAGNOSIS — F319 Bipolar disorder, unspecified: Secondary | ICD-10-CM | POA: Insufficient documentation

## 2017-12-12 DIAGNOSIS — I2510 Atherosclerotic heart disease of native coronary artery without angina pectoris: Secondary | ICD-10-CM | POA: Diagnosis not present

## 2017-12-12 DIAGNOSIS — E669 Obesity, unspecified: Secondary | ICD-10-CM | POA: Insufficient documentation

## 2017-12-12 DIAGNOSIS — Z8673 Personal history of transient ischemic attack (TIA), and cerebral infarction without residual deficits: Secondary | ICD-10-CM | POA: Insufficient documentation

## 2017-12-12 DIAGNOSIS — G4733 Obstructive sleep apnea (adult) (pediatric): Secondary | ICD-10-CM | POA: Diagnosis not present

## 2017-12-12 DIAGNOSIS — I110 Hypertensive heart disease with heart failure: Secondary | ICD-10-CM | POA: Diagnosis not present

## 2017-12-12 DIAGNOSIS — F209 Schizophrenia, unspecified: Secondary | ICD-10-CM | POA: Insufficient documentation

## 2017-12-12 DIAGNOSIS — I5022 Chronic systolic (congestive) heart failure: Secondary | ICD-10-CM | POA: Diagnosis present

## 2017-12-12 LAB — BASIC METABOLIC PANEL
ANION GAP: 10 (ref 5–15)
BUN: 11 mg/dL (ref 6–20)
CO2: 28 mmol/L (ref 22–32)
Calcium: 8.8 mg/dL — ABNORMAL LOW (ref 8.9–10.3)
Chloride: 103 mmol/L (ref 101–111)
Creatinine, Ser: 1.12 mg/dL (ref 0.61–1.24)
Glucose, Bld: 328 mg/dL — ABNORMAL HIGH (ref 65–99)
POTASSIUM: 3.9 mmol/L (ref 3.5–5.1)
Sodium: 141 mmol/L (ref 135–145)

## 2017-12-12 MED ORDER — SPIRONOLACTONE 25 MG PO TABS: 12.5000 mg | ORAL_TABLET | Freq: Every day | ORAL | 3 refills | Status: DC

## 2017-12-12 NOTE — Patient Instructions (Addendum)
Continue to eat low salt food less than 2gm of sodium per day And less than 2L of fluid per day Start Spironolactone 12.5mg  daily Next visit Wed June 5 at 3pm

## 2017-12-12 NOTE — Progress Notes (Signed)
PCP: None  M[facility]: [facility]  Pulmonologist: Dr Shelle Iron HF MD: Dr. Gala Romney   HPI:  Mitchell [name] is a 47 y.o. male with multiple medical problems including obesity, uncontrolled hypertension, uncontrolled diabetes, obstructive sleep apnea, bipolar disease/schizophrenia, CVA, nonobstructive coronary disease and systolic congestive heart failure secondary to nonischemic cardiomyopathy.He is S/P multiple tooth extractions 01/27/14.   Admitted to [facility] 6/10 through 01/09/14 with uncontrolled diabetes. Started back on insulin. He was also referred to Dr Kristin Bruins for teeth extractions. Bidil stopped due to suspected syncope. Discharge weight 276 pounds.   Admitted to [facility] 05/2016 with volume overload and possible pneumonia. Treated with antibiotics. He had been off HF meds for a few weeks. Diuresed with IV lasix and HF meds restarted. Discharge weight was 276 pounds.   He had a R/LHC on 09/26/17 with 3v disease and preserved CO/CI. Saw EP about ICD. He has been given some dates to choose from and is working to get his blood sugar under control. Stressed due to issues with transportation. BP elevated, but just took medication. No further presyncope or syncope. Denies CP, SOB, orthopnea, PND, or edema. Not wearing CPAP - says his sleep study was normal. Not weighing at home. Compliant with medications. Limiting fluid and salt intake.   He presents today for initial Pharmacy Clinic visit.   At last visit with Dr. Gala Romney 3/19 compliance with medications an diet (both fluid restriction and low salt foods) was re-emphasized, furosemide was decreased to 40mg  daily and empaglaflozin was started for improved DM control and CV risk reduction.  BP was elevated but no changes made at that time as he had just taken his meds. But consideration of adding BiDil or spironolactone in the future was discussed.  . Shortness of breath/dyspnea on exertion? no  . Orthopnea/PND? no . Edema?  no . Lightheadedness/dizziness? no . Daily weights at home? no . Blood pressure/heart rate monitoring at home? no . Following low-sodium/fluid-restricted diet? no  HF Medications: Carvedilol 6.25mg  BID Digoxin 0.125mg  Daily Furosemide 40mg  Daily Entresto 97-103mg  BID Potassium Daily  Has the patient been experiencing any side effects to the medications prescribed?  Yes - complain of ED symptoms will discuss with MD  Does the patient have any problems obtaining medications due to transportation or finances?   no  Understanding of regimen: fair Understanding of indications: fair Potential of compliance: fair Patient understands to avoid NSAIDs. Patient understands to avoid decongestants.    Pertinent Lab Values: . Serum creatinine 1.1 (last 0.9 2/19), CO2 28, Potassium 3.9, Sodium 141,  Digoxin 0.4 (2/19)  Vital Signs: . Weight: 279lb (dry weight: 276lb) . Blood pressure: 138/90 - no meds this am . Heart rate: 80s . O2 sat 99%  Assessment: 1. Chronicsystolic CHF (EF 07%), due to ICM. NYHA class II symptoms. -Volume status stable -Elevated BP - but he has not taken meds today -he does not take his meds until after he eats late morning - Bmet drawn K 3.9 - will add spironolactone 12.5mg  daily -Continue current HF medications  Carvedilol 6.25mg  BID Digoxin 0.125mg  Daily Furosemide 40mg  Daily Entresto 97-103mg  BID Potassium Daily - Re-educated on need for each medication as he feels like he is on too much and wants to stop. -Re-educated on limiting salt and fluid intake - Basic disease state pathophysiology, medication indication, mechanism and side effects reviewed at length with patient and he verbalized understanding   2. CAD - recent cath 09/26/17 reviewed 3v CAD (LAD p80, d95, OM1 95%, OM2  95%, RCA 70%, PDA 85%) - no s/s ischemia - Continue clopidogrel, statin  3. CVA - In [place] 2/9 - 09/09/17. CT with subacute R cerebellar changes. MRI report from  [place] with no acute or subacute process.  - + R facial droop on exam.  - Not on AC at this time.  - Continue clopidogrel and statin.   4. Syncope - Sounds vasovagal in etiology, Pending ICD with  - No driving x6 months per MD 5. DMII- Uncontrolled- Hgb A1C 13.9 09/13/17 - Following with PCP  -  jardiance started at last MD visit with HF data and very poorly controlled DM2. 6. OSA  - . Not wearing CPAP.  7. HTN: - Elevated today, but hasn't taken medications. Does not check at home.    Plan: 1) Medication changes: Based on clinical presentation, vital signs and recent labs will start spironolactone 12.5mg  daily 2) Labs: follow up BMET next week 3) Follow-up: with extender clinic 01/02/18   Leota Sauers Pharm.D. CPP, BCPS Clinical Pharmacist [phone] 12/12/2017 3:59 PM

## 2017-12-21 ENCOUNTER — Inpatient Hospital Stay (HOSPITAL_COMMUNITY): Admission: RE | Admit: 2017-12-21 | Payer: No Typology Code available for payment source | Source: Ambulatory Visit

## 2017-12-26 ENCOUNTER — Telehealth (HOSPITAL_COMMUNITY): Payer: Self-pay | Admitting: Pharmacist

## 2017-12-26 NOTE — Telephone Encounter (Signed)
New start on Spironolactone  Was to have BMET drawn Friday 5/21 - no show LMOM to please call and reschedule

## 2018-01-02 ENCOUNTER — Inpatient Hospital Stay (HOSPITAL_COMMUNITY): Admission: RE | Admit: 2018-01-02 | Payer: No Typology Code available for payment source | Source: Ambulatory Visit

## 2018-01-05 ENCOUNTER — Emergency Department (HOSPITAL_COMMUNITY)
Admission: EM | Admit: 2018-01-05 | Discharge: 2018-01-05 | Discharge status: Against Medical Advice | Payer: No Typology Code available for payment source | Source: Home / Self Care

## 2018-01-05 ENCOUNTER — Encounter (HOSPITAL_BASED_OUTPATIENT_CLINIC_OR_DEPARTMENT_OTHER): Payer: Self-pay | Admitting: Emergency Medicine

## 2018-01-05 ENCOUNTER — Emergency Department (HOSPITAL_BASED_OUTPATIENT_CLINIC_OR_DEPARTMENT_OTHER)
Admission: EM | Admit: 2018-01-05 | Discharge: 2018-01-05 | Disposition: A | Discharge status: Home / Self Care | Payer: No Typology Code available for payment source | Attending: Emergency Medicine | Admitting: Emergency Medicine

## 2018-01-05 ENCOUNTER — Other Ambulatory Visit: Payer: Self-pay

## 2018-01-05 ENCOUNTER — Emergency Department (HOSPITAL_BASED_OUTPATIENT_CLINIC_OR_DEPARTMENT_OTHER): Payer: No Typology Code available for payment source

## 2018-01-05 DIAGNOSIS — E119 Type 2 diabetes mellitus without complications: Secondary | ICD-10-CM | POA: Insufficient documentation

## 2018-01-05 DIAGNOSIS — I110 Hypertensive heart disease with heart failure: Secondary | ICD-10-CM | POA: Diagnosis not present

## 2018-01-05 DIAGNOSIS — I5022 Chronic systolic (congestive) heart failure: Secondary | ICD-10-CM | POA: Diagnosis not present

## 2018-01-05 DIAGNOSIS — Z7902 Long term (current) use of antithrombotics/antiplatelets: Secondary | ICD-10-CM | POA: Insufficient documentation

## 2018-01-05 DIAGNOSIS — Z8673 Personal history of transient ischemic attack (TIA), and cerebral infarction without residual deficits: Secondary | ICD-10-CM | POA: Insufficient documentation

## 2018-01-05 DIAGNOSIS — M542 Cervicalgia: Secondary | ICD-10-CM | POA: Diagnosis not present

## 2018-01-05 DIAGNOSIS — Y939 Activity, unspecified: Secondary | ICD-10-CM | POA: Diagnosis not present

## 2018-01-05 DIAGNOSIS — Y999 Unspecified external cause status: Secondary | ICD-10-CM | POA: Insufficient documentation

## 2018-01-05 DIAGNOSIS — I252 Old myocardial infarction: Secondary | ICD-10-CM | POA: Diagnosis not present

## 2018-01-05 DIAGNOSIS — Z794 Long term (current) use of insulin: Secondary | ICD-10-CM | POA: Insufficient documentation

## 2018-01-05 DIAGNOSIS — Z7982 Long term (current) use of aspirin: Secondary | ICD-10-CM | POA: Diagnosis not present

## 2018-01-05 DIAGNOSIS — Y92410 Unspecified street and highway as the place of occurrence of the external cause: Secondary | ICD-10-CM | POA: Diagnosis not present

## 2018-01-05 DIAGNOSIS — R51 Headache: Secondary | ICD-10-CM | POA: Insufficient documentation

## 2018-01-05 DIAGNOSIS — M25512 Pain in left shoulder: Secondary | ICD-10-CM | POA: Insufficient documentation

## 2018-01-05 DIAGNOSIS — M25561 Pain in right knee: Secondary | ICD-10-CM | POA: Diagnosis not present

## 2018-01-05 MED ORDER — METHOCARBAMOL 500 MG PO TABS: 500.0000 mg | ORAL_TABLET | Freq: Two times a day (BID) | ORAL | 0 refills | Status: DC
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
## 2018-01-05 NOTE — ED Notes (Signed)
Told by another patient in waiting room that [facility] left and went to [facility]
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
## 2018-01-09 ENCOUNTER — Ambulatory Visit (INDEPENDENT_AMBULATORY_CARE_PROVIDER_SITE_OTHER): Payer: No Typology Code available for payment source | Admitting: Podiatry

## 2018-01-09 DIAGNOSIS — L97512 Non-pressure chronic ulcer of other part of right foot with fat layer exposed: Secondary | ICD-10-CM | POA: Diagnosis not present

## 2018-01-09 DIAGNOSIS — E0843 Diabetes mellitus due to underlying condition with diabetic autonomic (poly)neuropathy: Secondary | ICD-10-CM

## 2018-01-09 DIAGNOSIS — M79676 Pain in unspecified toe(s): Secondary | ICD-10-CM | POA: Diagnosis not present

## 2018-01-09 DIAGNOSIS — B351 Tinea unguium: Secondary | ICD-10-CM

## 2018-01-09 DIAGNOSIS — I70235 Atherosclerosis of native arteries of right leg with ulceration of other part of foot: Secondary | ICD-10-CM

## 2018-01-19 NOTE — Progress Notes (Signed)
Subjective:  47 year old male with PMHx of T2DM presenting today for follow up evaluation of an ulceration of the right foot. He states the area is doing fine. He denies any pain or modifying factors. Patient is here for further evaluation and treatment.   Past Medical History:  Diagnosis Date  . Bipolar disorder (HCC)   . CHF (congestive heart failure) (HCC)   . Chronic systolic heart failure (HCC) 11/2010   a. NICM 4/14 EF 20-25%, TR mild  . CVA (cerebral infarction)    No residual deficits  . Depression    PTSD,   . Diabetes mellitus    TYPE II; UNCONTROLLED BY HEMOGLOBIN A1c; STABLE AS  PER DISCHARGE  . Headache(784.0)   . 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 History of colonic polyps   . Hyperlipidemia   . Hypertension   . Myocardial infarction (HCC) 1987   (while playing football)  . Non-ischemic cardiomyopathy (HCC)    No ischemia on myoview, showed EF of 42%. 2D echo showed EF 50-55% with diatolic dysfunction in 2009  . Obesity   . OSA (obstructive sleep apnea)    Being set up again for c-pap  . Pneumonia   . Post-cardiac injury syndrome (HCC)    History of cardiac injury from blunt trauma  . PVCs (premature ventricular contractions)   . Schizophrenia (HCC)    Goes to [facility]  . SOB (shortness of breath)   . Stroke [facility]) 2005   some left side weakness  . Syncope    Recurrent, thought to be vasovagal. Also has h/o frequent PVCs.       Objective/Physical Exam General: The patient is alert and oriented x3 in no acute distress.  Dermatology:  Wound #1 noted to the right sub-fifth MPJ measuring 0.5 x 0.5 x 0.2 cm (LxWxD).   To the noted ulceration(s), there is no eschar. There is a moderate amount of slough, fibrin, and necrotic tissue noted. Granulation tissue and wound base is red. There is a minimal amount of serosanguineous drainage noted. There is no exposed bone muscle-tendon ligament or joint. There is no malodor. Periwound integrity  is intact. Skin is warm, dry and supple bilateral lower extremities.  Vascular: Palpable pedal pulses bilaterally. No edema or erythema noted. Capillary refill within normal limits.  Neurological: Epicritic and protective threshold diminished bilaterally.   Musculoskeletal Exam: Range of motion within normal limits to all pedal and ankle joints bilateral. Muscle strength 5/5 in all groups bilateral.   Assessment: #1 ulceration of the right sub-fifth MPJ secondary to diabetes mellitus #2 diabetes mellitus w/ peripheral neuropathy   Plan of Care:  #1 Patient was evaluated. #2 medically necessary excisional debridement including subcutaneous tissue was performed using a tissue nipper and a chisel blade. Excisional debridement of all the necrotic nonviable tissue down to healthy bleeding viable tissue was performed with post-debridement measurements same as pre-. #3 the wound was cleansed and dry sterile dressing applied. #4 offloading pads provided.  #5 Recommended OTC antibiotic ointment applied daily with a dry sterile dressing.  #6 patient is to return to clinic in 3 weeks.   Felecia Shelling, DPM [facility]  Dr. Felecia Shelling, DPM    [address]  [place], [place] [address]                Office [phone]  Fax [phone]

## 2018-01-28 ENCOUNTER — Ambulatory Visit (INDEPENDENT_AMBULATORY_CARE_PROVIDER_SITE_OTHER): Payer: No Typology Code available for payment source | Admitting: Family Medicine

## 2018-01-28 ENCOUNTER — Encounter: Payer: Self-pay | Admitting: Family Medicine

## 2018-01-28 VITALS — BP 140/98 | HR 78 | Resp 16 | Ht 71.0 in | Wt 277.0 lb

## 2018-01-28 DIAGNOSIS — I10 Essential (primary) hypertension: Secondary | ICD-10-CM | POA: Diagnosis not present

## 2018-01-28 DIAGNOSIS — G8929 Other chronic pain: Secondary | ICD-10-CM | POA: Diagnosis not present

## 2018-01-28 DIAGNOSIS — M25512 Pain in left shoulder: Secondary | ICD-10-CM

## 2018-01-28 DIAGNOSIS — Z794 Long term (current) use of insulin: Secondary | ICD-10-CM | POA: Diagnosis not present

## 2018-01-28 DIAGNOSIS — E782 Mixed hyperlipidemia: Secondary | ICD-10-CM

## 2018-01-28 DIAGNOSIS — E119 Type 2 diabetes mellitus without complications: Secondary | ICD-10-CM

## 2018-01-28 LAB — CBC
HEMATOCRIT: 41.5 % (ref 39.0–52.0)
HEMOGLOBIN: 14.1 g/dL (ref 13.0–17.0)
MCHC: 33.9 g/dL (ref 30.0–36.0)
MCV: 90.2 fl (ref 78.0–100.0)
PLATELETS: 188 10*3/uL (ref 150.0–400.0)
RBC: 4.6 Mil/uL (ref 4.22–5.81)
RDW: 13.2 % (ref 11.5–15.5)
WBC: 9.5 10*3/uL (ref 4.0–10.5)

## 2018-01-28 LAB — COMPREHENSIVE METABOLIC PANEL
ALBUMIN: 4.1 g/dL (ref 3.5–5.2)
ALK PHOS: 80 U/L (ref 39–117)
ALT: 9 U/L (ref 0–53)
AST: 12 U/L (ref 0–37)
BILIRUBIN TOTAL: 0.4 mg/dL (ref 0.2–1.2)
BUN: 13 mg/dL (ref 6–23)
CO2: 32 mEq/L (ref 19–32)
Calcium: 9.8 mg/dL (ref 8.4–10.5)
Chloride: 101 mEq/L (ref 96–112)
Creatinine, Ser: 0.96 mg/dL (ref 0.40–1.50)
GFR: 106.85 mL/min (ref >60.00)
GLUCOSE: 140 mg/dL — AB (ref 70–99)
POTASSIUM: 3.5 meq/L (ref 3.5–5.1)
Sodium: 142 mEq/L (ref 135–145)
TOTAL PROTEIN: 6.9 g/dL (ref 6.0–8.3)

## 2018-01-28 LAB — HEMOGLOBIN A1C: Hgb A1c MFr Bld: 11.8 % — ABNORMAL HIGH (ref 4.6–6.5)
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
## 2018-01-28 NOTE — Patient Instructions (Addendum)
It was good to see you today- take care and we will get you set up to see an orthopedist about your left shoulder.    Please call and schedule a visit with Dr. Everardo All (your endocrinologist) about your diabetes  Address: [address] [name], [place] [address]  Phone: [phone]  You likely also need to see Dr. Leory Plowman pretty soon -please check with his office  I will get some labs for you today and will follow-up with the results asap

## 2018-01-30 ENCOUNTER — Ambulatory Visit: Payer: No Typology Code available for payment source | Admitting: Podiatry

## 2018-02-25 ENCOUNTER — Ambulatory Visit: Payer: No Typology Code available for payment source | Admitting: Podiatry

## 2018-03-04 ENCOUNTER — Emergency Department (HOSPITAL_COMMUNITY)
Admission: EM | Admit: 2018-03-04 | Discharge: 2018-03-04 | Disposition: A | Discharge status: Home / Self Care | Payer: No Typology Code available for payment source | Attending: Emergency Medicine | Admitting: Emergency Medicine

## 2018-03-04 DIAGNOSIS — Z7901 Long term (current) use of anticoagulants: Secondary | ICD-10-CM | POA: Insufficient documentation

## 2018-03-04 DIAGNOSIS — Z8673 Personal history of transient ischemic attack (TIA), and cerebral infarction without residual deficits: Secondary | ICD-10-CM | POA: Insufficient documentation

## 2018-03-04 DIAGNOSIS — E1165 Type 2 diabetes mellitus with hyperglycemia: Secondary | ICD-10-CM | POA: Diagnosis not present

## 2018-03-04 DIAGNOSIS — I10 Essential (primary) hypertension: Secondary | ICD-10-CM

## 2018-03-04 DIAGNOSIS — I110 Hypertensive heart disease with heart failure: Secondary | ICD-10-CM | POA: Diagnosis not present

## 2018-03-04 DIAGNOSIS — E876 Hypokalemia: Secondary | ICD-10-CM

## 2018-03-04 DIAGNOSIS — I5022 Chronic systolic (congestive) heart failure: Secondary | ICD-10-CM | POA: Diagnosis not present

## 2018-03-04 DIAGNOSIS — Z794 Long term (current) use of insulin: Secondary | ICD-10-CM | POA: Insufficient documentation

## 2018-03-04 DIAGNOSIS — Z7982 Long term (current) use of aspirin: Secondary | ICD-10-CM | POA: Diagnosis not present

## 2018-03-04 DIAGNOSIS — R739 Hyperglycemia, unspecified: Secondary | ICD-10-CM

## 2018-03-04 DIAGNOSIS — I252 Old myocardial infarction: Secondary | ICD-10-CM | POA: Diagnosis not present

## 2018-03-04 DIAGNOSIS — Z79899 Other long term (current) drug therapy: Secondary | ICD-10-CM | POA: Insufficient documentation

## 2018-03-04 LAB — BASIC METABOLIC PANEL
Anion gap: 11 (ref 5–15)
BUN: 12 mg/dL (ref 6–20)
CO2: 29 mmol/L (ref 22–32)
Calcium: 9 mg/dL (ref 8.9–10.3)
Chloride: 99 mmol/L (ref 98–111)
Creatinine, Ser: 1.07 mg/dL (ref 0.61–1.24)
GFR calc Af Amer: >60 mL/min (ref >60)
GFR calc non Af Amer: >60 mL/min (ref >60)
GLUCOSE: 277 mg/dL — AB (ref 70–99)
Potassium: 3.3 mmol/L — ABNORMAL LOW (ref 3.5–5.1)
Sodium: 139 mmol/L (ref 135–145)

## 2018-03-04 LAB — CBC
HEMATOCRIT: 40.3 % (ref 39.0–52.0)
Hemoglobin: 12.9 g/dL — ABNORMAL LOW (ref 13.0–17.0)
MCH: 29.3 pg (ref 26.0–34.0)
MCHC: 32 g/dL (ref 30.0–36.0)
MCV: 91.6 fL (ref 78.0–100.0)
PLATELETS: 161 10*3/uL (ref 150–400)
RBC: 4.4 MIL/uL (ref 4.22–5.81)
RDW: 11.9 % (ref 11.5–15.5)
WBC: 8.6 10*3/uL (ref 4.0–10.5)

## 2018-03-04 LAB — URINALYSIS, ROUTINE W REFLEX MICROSCOPIC
BACTERIA UA: NONE SEEN
Bilirubin Urine: NEGATIVE
Ketones, ur: NEGATIVE mg/dL
Leukocytes, UA: NEGATIVE
Nitrite: NEGATIVE
PROTEIN: NEGATIVE mg/dL
Specific Gravity, Urine: 1.018 (ref 1.005–1.030)
pH: 5 (ref 5.0–8.0)

## 2018-03-04 LAB — CBG MONITORING, ED: Glucose-Capillary: 289 mg/dL — ABNORMAL HIGH (ref 70–99)

## 2018-03-04 MED ORDER — POTASSIUM CHLORIDE CRYS ER 20 MEQ PO TBCR
40.0000 meq | EXTENDED_RELEASE_TABLET | Freq: Once | ORAL | Status: AC
  Administered 2018-03-04: 40 meq via ORAL
  Filled 2018-03-04: qty 2

## 2018-03-04 NOTE — ED Provider Notes (Signed)
[facility] EMERGENCY DEPARTMENT Provider Note   CSN: [id number] Arrival date & time: 03/04/18  1949     History   Chief Complaint Chief Complaint  Patient presents with  . Hypertension    HPI Mitchell [name] is a 47 y.o. male.  Mitchell [name] is a 47 y.o. Male with history of CHF, diabetes, CVA, hypertension, hyperlipidemia, schizophrenia and bipolar disorder, who presents to the emergency department for evaluation of hypertension and hyperglycemia via EMS.  Patient reports he had just taken his evening medications while at work when he started coughing heavily, and his boss became very concerned and contacted EMS for transport.  Patient was noted to be hypertensive with EMS with blood pressure of 192/110, and hyperglycemic with blood sugar of 407.  Patient reports just prior to this being checked he had taken his furosemide and insulin.  He is reports his furosemide frequently makes him cough for a few minutes after taking it and then this goes away on its own.  He denies having any chest pain or shortness of breath, no sputum production with cough.  No fevers or chills.  He denies abdominal pain, nausea or vomiting.  No lower extremity swelling.  Denies headaches, vision changes, confusion, numbness, tingling or weakness of any extremities.  Patient repeatedly states he is fine, and is not having any symptoms and had just taken his medications and they just need to kick in, no further coughing.     Past Medical History:  Diagnosis Date  . Bipolar disorder (HCC)   . CHF (congestive heart failure) (HCC)   . Chronic systolic heart failure (HCC) 11/2010   a. NICM 4/14 EF 20-25%, TR mild  . CVA (cerebral infarction)    No residual deficits  . Depression    PTSD,   . Diabetes mellitus    TYPE II; UNCONTROLLED BY HEMOGLOBIN A1c; STABLE AS  PER DISCHARGE  . Headache(784.0)   . 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 History of colonic polyps   . Hyperlipidemia   .  Hypertension   . Myocardial infarction (HCC) 1987   (while playing football)  . Non-ischemic cardiomyopathy (HCC)    No ischemia on myoview, showed EF of 42%. 2D echo showed EF 50-55% with diatolic dysfunction in 2009  . Obesity   . OSA (obstructive sleep apnea)    Being set up again for c-pap  . Pneumonia   . Post-cardiac injury syndrome (HCC)    History of cardiac injury from blunt trauma  . PVCs (premature ventricular contractions)   . Schizophrenia (HCC)    Goes to [facility]  . SOB (shortness of breath)   . Stroke [facility]) 2005   some left side weakness  . Syncope    Recurrent, thought to be vasovagal. Also has h/o frequent PVCs.     Patient Active Problem List   Diagnosis Date Noted  . Weakness 05/08/2017  . Leukocytosis 05/08/2017  . Stroke (HCC) 05/08/2017  . Slurred speech 05/08/2017  . Noncompliance 01/22/2014  . Chronic systolic heart failure (HCC) 10/02/2011  . Erectile dysfunction 10/02/2011  . Obstructive sleep apnea 10/11/2007  . Diabetes mellitus with complication (HCC) 10/10/2007  . HLD (hyperlipidemia) 10/10/2007  . HYPOKALEMIA 10/10/2007  . OBESITY 10/10/2007  . Essential hypertension 10/10/2007  . PREMATURE VENTRICULAR CONTRACTIONS 10/10/2007  . SYNCOPE 10/10/2007  . COLONIC POLYPS, HX OF 10/10/2007    Past Surgical History:  Procedure Laterality Date  . CARDIAC CATHETERIZATION  12/19/10  DIFFUSE NONOBSTRUCTIVE CAD; NONISCHEMIC CARDIOMYOPATHY; LEFT VENTRICULAR ANGIOGRAM WAS PERFORMED SECONDARY TO  ELEVATED LEFT VENTRICULAR FILLING PRESSURES  . COLONOSCOPY W/ POLYPECTOMY    . MULTIPLE EXTRACTIONS WITH ALVEOLOPLASTY  01/27/2014   "all my teeth; 4 Quadrants of alveoloplasty  . MULTIPLE EXTRACTIONS WITH ALVEOLOPLASTY N/A 01/27/2014   Procedure: EXTRACTION OF TEETH #'1, 2, 3, 4, 5, 6, 7, 8, 9, 10, 11, 12, 13, 14, 15, 16, 17, 20, 21, 22, 23, 24, 25, 26, 27, 28, 29, 31 and 32 WITH ALVEOLOPLASTY;  Surgeon: Charlynne Pander, DDS;  Location: [facility]  OR;  Service: Oral Surgery;  Laterality: N/A;  . ORIF FINGER / THUMB FRACTURE Right   . RIGHT/LEFT HEART CATH AND CORONARY ANGIOGRAPHY N/A 09/26/2017   Procedure: RIGHT/LEFT HEART CATH AND CORONARY ANGIOGRAPHY;  Surgeon: Dolores Patty, MD;  Location: [facility] INVASIVE CV LAB;  Service: Cardiovascular;  Laterality: N/A;        Home Medications    Prior to Admission medications   Medication Sig Start Date End Date Taking? Authorizing Provider  aspirin 81 MG EC tablet Take 1 tablet (81 mg total) by mouth daily. 05/23/17   [name], Gwenlyn Found, MD  atorvastatin (LIPITOR) 40 MG tablet Take 2 tablets (80 mg total) by mouth at bedtime. 05/23/17   [name], Gwenlyn Found, MD  carvedilol (COREG) 6.25 MG tablet Take 1 tablet (6.25 mg total) by mouth 2 (two) times daily with a meal. 10/02/16   [name], Gwenlyn Found, MD  cetirizine (ZYRTEC ALLERGY) 10 MG tablet Take 1 tablet (10 mg total) by mouth daily. 09/13/17   [name], Gwenlyn Found, MD  clopidogrel (PLAVIX) 75 MG tablet Take 1 tablet (75 mg total) by mouth daily. 05/23/17   [name], Gwenlyn Found, MD  digoxin (LANOXIN) 0.125 MG tablet TAKE 1 TABLET BY MOUTH ONCE DAILY 10/24/17   [name], Gwenlyn Found, MD  empagliflozin (JARDIANCE) 10 MG TABS tablet Take 10 mg by mouth daily. 10/23/17   Bensimhon, [name], MD  fluticasone (FLONASE) 50 MCG/ACT nasal spray Place 2 sprays into both nostrils daily. 09/13/17   [name], Gwenlyn Found, MD  furosemide (LASIX) 40 MG tablet Take 1 tablet (40 mg total) by mouth daily. 10/23/17   Bensimhon, [name], MD  glucose blood test strip Pt uses one touch.  Test BID 11/02/16   [name], Gwenlyn Found, MD  insulin NPH-regular Human (NOVOLIN 70/30) (70-30) 100 UNIT/ML injection Inject 60 Units into the skin 2 (two) times daily with a meal. 09/13/17   Romero Belling, MD  Lancets 28G MISC Use for glucose testing up to BID 11/02/16   [name], Gwenlyn Found, MD  methocarbamol (ROBAXIN) 500 MG tablet Take 1 tablet (500 mg total) by mouth 2 (two) times daily. 01/05/18    Khatri, Hina, PA-C  Potassium Chloride ER 20 MEQ TBCR Take 20 mEq by mouth daily. 10/02/16   [name], Gwenlyn Found, MD  sacubitril-valsartan (ENTRESTO) 97-103 MG Take 1 tablet by mouth 2 (two) times daily. 09/19/17   Graciella Freer, PA-C    Family History Family History  Problem Relation Age of Onset  . Heart disease Mother        MI  . Heart failure Mother   . Diabetes Mother        ALSO IN MOST OF HIS SIBLINGS; 2 UNLCES HAVE ALSO PASSED AWAY FROM DM  . Cardiomyopathy Mother   . Cancer - Ovarian Mother   . Heart disease Father   . Hypertension Father   . Diabetes Father  Social History Social History   Tobacco Use  . Smoking status: Never Smoker  . Smokeless tobacco: Never Used  Substance Use Topics  . Alcohol use: No  . Drug use: No     Allergies   Nsaids   Review of Systems Review of Systems  Constitutional: Negative for chills and fever.  HENT: Negative for congestion, rhinorrhea and sore throat.   Eyes: Negative for visual disturbance.  Respiratory: Negative for cough, chest tightness and shortness of breath.   Cardiovascular: Negative for chest pain.  Gastrointestinal: Negative for abdominal pain, constipation, diarrhea, nausea and vomiting.  Genitourinary: Negative for dysuria and frequency.  Musculoskeletal: Negative for arthralgias and myalgias.  Skin: Negative for color change and rash.  Neurological: Negative for dizziness, syncope, facial asymmetry, speech difficulty, weakness, light-headedness, numbness and headaches.     Physical Exam Updated Vital Signs SpO2 100%   Physical Exam  Constitutional: He is oriented to person, place, and time. He appears well-developed and well-nourished. No distress.  HENT:  Head: Normocephalic and atraumatic.  Mouth/Throat: Oropharynx is clear and moist.  Eyes: Pupils are equal, round, and reactive to light. EOM are normal. Right eye exhibits no discharge. Left eye exhibits no discharge.  No nystagmus    Neck: Normal range of motion. Neck supple.  Cardiovascular: Normal rate, regular rhythm, normal heart sounds and intact distal pulses.  Pulses:      Radial pulses are 2+ on the right side, and 2+ on the left side.       Dorsalis pedis pulses are 2+ on the right side, and 2+ on the left side.       Posterior tibial pulses are 2+ on the right side, and 2+ on the left side.  Pulmonary/Chest: Effort normal and breath sounds normal. No stridor. No respiratory distress. He has no wheezes.  Respirations equal and unlabored, patient able to speak in full sentences, lungs clear to auscultation bilaterally  Abdominal: Soft. Bowel sounds are normal. He exhibits no distension and no mass. There is no tenderness. There is no guarding.  Abdomen soft, nondistended, nontender to palpation in all quadrants without guarding or peritoneal signs  Musculoskeletal: He exhibits no edema or deformity.  Bilateral lower extremities without significant edema or tenderness.  Neurological: He is alert and oriented to person, place, and time. Coordination normal.  Speech is clear, able to follow commands CN III-XII intact Normal strength in upper and lower extremities bilaterally including dorsiflexion and plantar flexion, strong and equal grip strength Sensation normal to light and sharp touch Moves extremities without ataxia, coordination intact Normal finger to nose and rapid alternating movements No pronator drift  Skin: Skin is warm and dry. He is not diaphoretic.  Psychiatric: He has a normal mood and affect. His behavior is normal.  Nursing note and vitals reviewed.    ED Treatments / Results  Labs (all labs ordered are listed, but only abnormal results are displayed) Labs Reviewed  CBC - Abnormal; Notable for the following components:      Result Value   Hemoglobin 12.9 (*)    All other components within normal limits  BASIC METABOLIC PANEL - Abnormal; Notable for the following components:   Potassium  3.3 (*)    Glucose, Bld 277 (*)    All other components within normal limits  URINALYSIS, ROUTINE W REFLEX MICROSCOPIC - Abnormal; Notable for the following components:   Color, Urine STRAW (*)    Glucose, UA >=500 (*)    Hgb urine dipstick SMALL (*)  All other components within normal limits  CBG MONITORING, ED - Abnormal; Notable for the following components:   Glucose-Capillary 289 (*)    All other components within normal limits    EKG EKG Interpretation  Date/Time:  Monday March 04 2018 20:17:51 EDT Ventricular Rate:  83 PR Interval:    QRS Duration: 124 QT Interval:  423 QTC Calculation: 498 R Axis:   -56 Text Interpretation:  Sinus rhythm Probable left atrial enlargement Left bundle branch block Confirmed by Kennis Carina [phone]) on 03/04/2018 9:29:01 PM   Radiology No results found.  Procedures Procedures (including critical care time)  Medications Ordered in ED Medications  potassium chloride SA (K-DUR,KLOR-CON) CR tablet 40 mEq (40 mEq Oral Given 03/04/18 2141)     Initial Impression / Assessment and Plan / ED Course  I have reviewed the triage vital signs and the nursing notes.  Pertinent labs & imaging results that were available during my care of the patient were reviewed by me and considered in my medical decision making (see chart for details).  Patient presents for evaluation of hypertension and hyperglycemia.  He reports he had just taken his medications at work, and then he had a coughing spell which concerned his boss.  Patient was noted to be hypertensive and hyperglycemic on scene with EMS.  On arrival here CBG has improved significantly from 407 to 289, as patient had just taken his insulin.  Patient remains hypertensive at 180/100.  He is not having any symptoms of chest pain, shortness of breath, headache, vision changes, confusion, numbness or weakness to suggest hypertensive urgency or emergency.  Will check basic labs in the setting of  hyperglycemia to rule out any significant metabolic derangements or DKA.  Since EKG is without significant changes.  Glucose is 277, normal anion gap, mild hypokalemia of 3.3, which was corrected with oral potassium, no other acute electrolyte derangements and normal renal function.  Stable hemoglobin and normal white count.  Urine with greater than 500 glucose, but no signs of infection.  Labs are very reassuring, no evidence of DKA, or kidney damage from persistent hypertension.  Patient's blood pressure is improved to 160/102 here in the ED.  Discussed with patient the importance of following up with his primary care doctor for continued evaluation and management of his diabetes and hypertension, strict return precautions discussed.  Patient expresses understanding and is in agreement with this plan.  Final Clinical Impressions(s) / ED Diagnoses   Final diagnoses:  Hypertension, unspecified type  Hyperglycemia    ED Discharge Orders    None       Legrand Rams 03/04/18 2307    Sabas Sous, MD 03/04/18 2311

## 2018-03-04 NOTE — Discharge Instructions (Signed)
Labs overall look good today.  Your blood sugar is elevated at 277 please follow-up with your primary care doctor for continued management of your diabetes.  Your blood pressure was elevated today, please follow up with your primary doctor in 1 week for blood pressure recheck.  Return to the emergency department if you develop chest pain, shortness of breath, severe swelling in your legs, headaches, vision changes, confusion or any other new or concerning symptoms.

## 2018-03-04 NOTE — ED Notes (Signed)
Patient verbalizes understanding of discharge instructions. Opportunity for questioning and answers were provided. Armband removed by staff, pt discharged from ED ambulatory.   

## 2018-03-04 NOTE — ED Triage Notes (Signed)
Pt transported from work after coughing episode, pt found to be hypertensive with hyperglycemia of 407, IV est, pt is A & O

## 2018-03-07 ENCOUNTER — Encounter

## 2018-03-07 ENCOUNTER — Encounter: Payer: Self-pay | Admitting: Family Medicine

## 2018-03-07 ENCOUNTER — Ambulatory Visit (INDEPENDENT_AMBULATORY_CARE_PROVIDER_SITE_OTHER): Payer: No Typology Code available for payment source | Admitting: Family Medicine

## 2018-03-07 VITALS — BP 126/82 | HR 88 | Temp 98.3°F | Resp 17 | Ht 71.0 in | Wt 274.0 lb

## 2018-03-07 DIAGNOSIS — Z794 Long term (current) use of insulin: Secondary | ICD-10-CM | POA: Diagnosis not present

## 2018-03-07 DIAGNOSIS — E119 Type 2 diabetes mellitus without complications: Secondary | ICD-10-CM | POA: Diagnosis not present

## 2018-03-07 DIAGNOSIS — I5022 Chronic systolic (congestive) heart failure: Secondary | ICD-10-CM | POA: Diagnosis not present

## 2018-03-07 DIAGNOSIS — Z09 Encounter for follow-up examination after completed treatment for conditions other than malignant neoplasm: Secondary | ICD-10-CM | POA: Diagnosis not present

## 2018-03-07 DIAGNOSIS — I10 Essential (primary) hypertension: Secondary | ICD-10-CM | POA: Diagnosis not present

## 2018-03-07 LAB — GLUCOSE, POCT (MANUAL RESULT ENTRY): POC Glucose: 227 mg/dl — AB (ref 70–99)

## 2018-03-07 NOTE — Progress Notes (Signed)
[facility] [address] [place], [place] [address] [phone]  Date:  03/07/2018   Name:  Mitchell [name]   DOB:  05/07/1971   MRN:  [id number]  PCP:  Pearline Cables, MD    Chief Complaint: ER follow Up (seen 03/04/18, sweating, paperwork filled out for work) and Hypertension   History of Present Illness:  Mitchell [name] is a 47 y.o. very pleasant male patient who presents with the following: History of HTN, DM, CHF (managed by advanced heart failure team, last visit in March), CVA in February of this year, mental illness (uncertain exact dx but bipolar and schizophrenia are listed on his dx list)  He was in the ER on Monday of this week- today is Thursday He was at work and developed sx of cough.  His boss got worried and called 911 and he ended up in the ER due to  Hypertension and hyperglycemia  Per ER note-  Patient presents for evaluation of hypertension and hyperglycemia.  He reports he had just taken his medications at work, and then he had a coughing spell which concerned his boss.  Patient was noted to be hypertensive and hyperglycemic on scene with EMS.  On arrival here CBG has improved significantly from 407 to 289, as patient had just taken his insulin.  Patient remains hypertensive at 180/100.  He is not having any symptoms of chest pain, shortness of breath, headache, vision changes, confusion, numbness or weakness to suggest hypertensive urgency or emergency.  Will check basic labs in the setting of hyperglycemia to rule out any significant metabolic derangements or DKA.  Since EKG is without significant changes.  Glucose is 277, normal anion gap, mild hypokalemia of 3.3, which was corrected with oral potassium, no other acute electrolyte derangements and normal renal function.  Stable hemoglobin and normal white count.  Urine with greater than 500 glucose, but no signs of infection.  Labs are very reassuring,  no evidence of DKA, or kidney damage from persistent hypertension.  Patient's blood pressure is improved to 160/102 here in the ED.  Discussed with patient the importance of following up with his primary care doctor for continued evaluation and management of his diabetes and hypertension, strict return precautions discussed.  Patient expresses understanding and is in agreement with this plan.   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  Mitchell [name] is not aware of needing any accomidations  and notes that he can do his job without any problems  I completed this PPW giving him from 8/5 until today out, but clearing him to return at full duty later on today as he wishes to do   Wt Readings from Last 3 Encounters:  03/07/18 274 lb (124.3 kg)  01/28/18 277 lb (125.6 kg)  01/05/18 280 lb (127 kg)   His weight looks fine, does not appear to be retaining fluid  Patient Active Problem List   Diagnosis Date Noted  . Weakness 05/08/2017  . Leukocytosis 05/08/2017  . Stroke (HCC) 05/08/2017  . Slurred speech 05/08/2017  . Noncompliance 01/22/2014  . Chronic systolic heart failure  (HCC) 16/05/9603  . Erectile dysfunction 10/02/2011  . Obstructive sleep apnea 10/11/2007  . Diabetes mellitus with complication (HCC) 10/10/2007  . HLD (hyperlipidemia) 10/10/2007  . HYPOKALEMIA 10/10/2007  . OBESITY 10/10/2007  . Essential hypertension 10/10/2007  . PREMATURE VENTRICULAR CONTRACTIONS 10/10/2007  . SYNCOPE 10/10/2007  . COLONIC POLYPS, HX OF 10/10/2007    Past Medical History:  Diagnosis Date  . Bipolar disorder (HCC)   . CHF (congestive heart failure) (HCC)   . Chronic systolic heart failure (HCC) 11/2010   a. NICM 4/14 EF 20-25%, TR mild  . CVA (cerebral infarction)    No residual deficits  . Depression    PTSD,   . Diabetes mellitus    TYPE II; UNCONTROLLED BY HEMOGLOBIN A1c; STABLE AS  PER DISCHARGE  . Headache(784.0)   . 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 History of colonic polyps   . Hyperlipidemia   . Hypertension   . Myocardial infarction (HCC) 1987   (while playing football)  . Non-ischemic cardiomyopathy (HCC)    No ischemia on myoview, showed EF of 42%. 2D echo showed EF 50-55% with diatolic dysfunction in 2009  . Obesity   . OSA (obstructive sleep apnea)    Being set up again for c-pap  . Pneumonia   . Post-cardiac injury syndrome (HCC)    History of cardiac injury from blunt trauma  . PVCs (premature ventricular contractions)   . Schizophrenia (HCC)    Goes to [facility]  . SOB (shortness of breath)   . Stroke [facility]) 2005   some left side weakness  . Syncope    Recurrent, thought to be vasovagal. Also has h/o frequent PVCs.     Past Surgical History:  Procedure Laterality Date  . CARDIAC CATHETERIZATION  12/19/10   DIFFUSE NONOBSTRUCTIVE CAD; NONISCHEMIC CARDIOMYOPATHY; LEFT VENTRICULAR ANGIOGRAM WAS PERFORMED SECONDARY TO  ELEVATED LEFT VENTRICULAR FILLING PRESSURES  . COLONOSCOPY W/ POLYPECTOMY    . MULTIPLE EXTRACTIONS WITH ALVEOLOPLASTY  01/27/2014   "all my teeth; 4 Quadrants of alveoloplasty  . MULTIPLE  EXTRACTIONS WITH ALVEOLOPLASTY N/A 01/27/2014   Procedure: EXTRACTION OF TEETH #'1, 2, 3, 4, 5, 6, 7, 8, 9, 10, 11, 12, 13, 14, 15, 16, 17, 20, 21, 22, 23, 24, 25, 26, 27, 28, 29, 31 and 32 WITH ALVEOLOPLASTY;  Surgeon: Charlynne Pander, DDS;  Location: [facility] OR;  Service: Oral Surgery;  Laterality: N/A;  . ORIF FINGER / THUMB FRACTURE Right   . RIGHT/LEFT HEART CATH AND CORONARY ANGIOGRAPHY N/A 09/26/2017   Procedure: RIGHT/LEFT HEART CATH AND CORONARY ANGIOGRAPHY;  Surgeon: Dolores Patty, MD;  Location: [facility] INVASIVE CV LAB;  Service: Cardiovascular;  Laterality: N/A;    Social History   Tobacco Use  . Smoking status: Never Smoker  . Smokeless tobacco: Never Used  Substance Use Topics  . Alcohol use: No  . Drug use: No    Family History  Problem Relation Age of Onset  . Heart disease Mother        MI  . Heart failure Mother   . Diabetes Mother        ALSO IN MOST OF HIS SIBLINGS; 2 UNLCES HAVE ALSO PASSED AWAY FROM DM  . Cardiomyopathy Mother   . Cancer - Ovarian Mother   . Heart disease Father   . Hypertension Father   . Diabetes Father     Allergies  Allergen Reactions  . Nsaids Anaphylaxis and Other (See Comments)    Able to tolerate aspirin     Medication list has been reviewed and updated.  Current Outpatient Medications on File Prior to Visit  Medication Sig Dispense Refill  . aspirin 81 MG EC tablet Take 1 tablet (81 mg total) by mouth daily. 90 tablet 3  . atorvastatin (LIPITOR) 40 MG tablet Take 2 tablets (80 mg total) by mouth at bedtime. 60 tablet 11  . carvedilol (COREG) 6.25 MG tablet Take 1 tablet (6.25 mg total) by mouth 2 (two) times daily with a meal. 180 tablet 3  . cetirizine (ZYRTEC ALLERGY) 10 MG tablet Take 1 tablet (10 mg total) by mouth daily. 30 tablet 11  . clopidogrel (PLAVIX) 75 MG tablet Take 1 tablet (75 mg total) by mouth daily. 30 tablet 11  . digoxin (LANOXIN) 0.125 MG tablet TAKE 1 TABLET BY MOUTH ONCE DAILY 30 tablet 11  .  empagliflozin (JARDIANCE) 10 MG TABS tablet Take 10 mg by mouth daily. 30 tablet 6  . fluticasone (FLONASE) 50 MCG/ACT nasal spray Place 2 sprays into both nostrils daily. 16 g 11  . furosemide (LASIX) 40 MG tablet Take 1 tablet (40 mg total) by mouth daily. 180 tablet 3  . glucose blood test strip Pt uses one touch.  Test BID 100 each 12  . insulin NPH-regular Human (NOVOLIN 70/30) (70-30) 100 UNIT/ML injection Inject 60 Units into the skin 2 (two) times daily with a meal. 40 mL 11  . Lancets 28G MISC Use for glucose testing up to BID 100 each 12  . methocarbamol (ROBAXIN) 500 MG tablet Take 1 tablet (500 mg total) by mouth 2 (two) times daily. 20 tablet 0  . Potassium Chloride ER 20 MEQ TBCR Take 20 mEq by mouth daily. 30 tablet 3  . sacubitril-valsartan (ENTRESTO) 97-103 MG Take 1 tablet by mouth 2 (two) times daily. 60 tablet 11   No current facility-administered medications on file prior to visit.     Review of Systems:  As per HPI- otherwise negative. No fever or chills No CP or SOB Physical Examination: Vitals:   03/07/18 0914  BP: 126/82  Pulse: 88  Resp: 17  Temp: 98.3 F (36.8 C)  SpO2: 97%   Vitals:   03/07/18 0914  Weight: 274 lb (124.3 kg)  Height: 5\' 11"  (1.803 m)   Body mass index is 38.22 kg/m. Ideal Body Weight: Weight in (lb) to have BMI = 25: 178.9  GEN: WDWN, NAD, Non-toxic, A & O x 3, obese, looks well  HEENT: Atraumatic, Normocephalic. Neck supple. No masses, No LAD.  Bilateral TM wnl, oropharynx normal.  PEERL,EOMI.   Ears and Nose: No external deformity. CV: RRR, No M/G/R. No JVD. No thrill. No extra heart sounds. PULM: CTA B, no wheezes, crackles, rhonchi. No retractions. No resp. distress. No accessory muscle use. EXTR: No c/c/e NEURO  Normal gait.  PSYCH: Normally interactive. Conversant. Not depressed or anxious appearing.  Calm demeanor.   Results for orders placed or performed in visit on 03/07/18  POCT glucose (manual entry)  Result Value  Ref Range   POC Glucose 227 (A) 70 - 99 mg/dl    Assessment and Plan: Type 2 diabetes mellitus without complication, with long-term current use of insulin (HCC) - Plan: POCT glucose (manual entry)  Essential hypertension  Chronic systolic heart failure [facility])  Hospital discharge follow-up  Following up from recent ER visit He was seen with hypertension and hyperglycemia. Pt reports that he feels well, his BP is fine today and glucose is reasonable I called and made an endocrinology appt for him for Monday to discuss his glucose control; pt has no- showed prior appt, advised him that he may be discharged from practice if he continues to no show.  He understands Completed RTW forms for him today Fluid balance is ok today Asked him to see me in 4 months for a recheck, sooner if any concerns   Signed Abbe Amsterdam, MD

## 2018-03-07 NOTE — Patient Instructions (Signed)
Good to see you today- you can go back to work!   You have an appointment with endocrinology to discuss your diabetes next week- if you can't go please call them!   Otherwise be sure to go to your appointment, at least 15 minutes early  Take care and please see me in about 4 months for a recheck - sooner if you have any concerns

## 2018-03-11 ENCOUNTER — Ambulatory Visit (INDEPENDENT_AMBULATORY_CARE_PROVIDER_SITE_OTHER): Payer: No Typology Code available for payment source | Admitting: Endocrinology

## 2018-03-11 ENCOUNTER — Encounter: Payer: Self-pay | Admitting: Endocrinology

## 2018-03-11 VITALS — BP 136/78 | HR 86 | Ht 71.0 in | Wt 272.0 lb

## 2018-03-11 DIAGNOSIS — E118 Type 2 diabetes mellitus with unspecified complications: Secondary | ICD-10-CM

## 2018-03-11 DIAGNOSIS — L97509 Non-pressure chronic ulcer of other part of unspecified foot with unspecified severity: Secondary | ICD-10-CM | POA: Insufficient documentation

## 2018-03-11 DIAGNOSIS — L97519 Non-pressure chronic ulcer of other part of right foot with unspecified severity: Secondary | ICD-10-CM | POA: Diagnosis not present

## 2018-03-11 LAB — POCT GLYCOSYLATED HEMOGLOBIN (HGB A1C): Hemoglobin A1C: 10.3 % — AB (ref 4.0–5.6)

## 2018-03-11 MED ORDER — AMOXICILLIN-POT CLAVULANATE 875-125 MG PO TABS: 1.0000 | ORAL_TABLET | Freq: Two times a day (BID) | ORAL | 0 refills | Status: DC

## 2018-03-11 MED ORDER — INSULIN GLARGINE 100 UNIT/ML SOLOSTAR PEN: 120.0000 [IU] | PEN_INJECTOR | SUBCUTANEOUS | 99 refills | Status: DC

## 2018-03-11 NOTE — Patient Instructions (Addendum)
check your blood sugar twice a day.  vary the time of day when you check, between before the 3 meals, and at bedtime.  also check if you have symptoms of your blood sugar being too high or too low.  please keep a record of the readings and bring it to your next appointment here (or you can bring the meter itself).  You can write it on any piece of paper.  please call us sooner if your blood sugar goes below 70, or if you have a lot of readings over 200.  I have sent 2 prescriptions to your pharmacy: to change to a once a day insulin, and for the antibiotic pill.   Please see a wound care specialist.  you will receive a phone call, about a day and time for an appointment.  Here is a discount card, for the insulin.   Please keep the foot ulcer covered with antibiotic ointment, and a bandaid, until you see the specialist.  Please come back for a follow-up appointment in 2 weeks.

## 2018-03-11 NOTE — Progress Notes (Signed)
Subjective:    Patient ID: Mitchell [name], male    DOB: Dec 20, 1970, 47 y.o.   MRN: [id number]  HPI Pt returns for f/u of diabetes mellitus: DM type: Insulin-requiring type 2 Dx'ed: 1981 Complications: polyneuropathy, foot ulcer, and CVA Therapy: insulin since 2017 DKA: never Severe hypoglycemia: never Pancreatitis: never Pancreatic imaging: normal on 2012 CT.  Other: he declines multiple daily injections Interval history: Last week, he was seen in ER for acute illness and severe hyperglycemia.  Pt says he never misses the insulin.   Past Medical History:  Diagnosis Date  . Bipolar disorder (HCC)   . CHF (congestive heart failure) (HCC)   . Chronic systolic heart failure (HCC) 11/2010   a. NICM 4/14 EF 20-25%, TR mild  . CVA (cerebral infarction)    No residual deficits  . Depression    PTSD,   . Diabetes mellitus    TYPE II; UNCONTROLLED BY HEMOGLOBIN A1c; STABLE AS  PER DISCHARGE  . Headache(784.0)   . 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 History of colonic polyps   . Hyperlipidemia   . Hypertension   . Myocardial infarction (HCC) 1987   (while playing football)  . Non-ischemic cardiomyopathy (HCC)    No ischemia on myoview, showed EF of 42%. 2D echo showed EF 50-55% with diatolic dysfunction in 2009  . Obesity   . OSA (obstructive sleep apnea)    Being set up again for c-pap  . Pneumonia   . Post-cardiac injury syndrome (HCC)    History of cardiac injury from blunt trauma  . PVCs (premature ventricular contractions)   . Schizophrenia (HCC)    Goes to [facility]  . SOB (shortness of breath)   . Stroke [facility]) 2005   some left side weakness  . Syncope    Recurrent, thought to be vasovagal. Also has h/o frequent PVCs.     Past Surgical History:  Procedure Laterality Date  . CARDIAC CATHETERIZATION  12/19/10   DIFFUSE NONOBSTRUCTIVE CAD; NONISCHEMIC CARDIOMYOPATHY; LEFT VENTRICULAR ANGIOGRAM WAS PERFORMED SECONDARY TO  ELEVATED LEFT VENTRICULAR  FILLING PRESSURES  . COLONOSCOPY W/ POLYPECTOMY    . MULTIPLE EXTRACTIONS WITH ALVEOLOPLASTY  01/27/2014   "all my teeth; 4 Quadrants of alveoloplasty  . MULTIPLE EXTRACTIONS WITH ALVEOLOPLASTY N/A 01/27/2014   Procedure: EXTRACTION OF TEETH #'1, 2, 3, 4, 5, 6, 7, 8, 9, 10, 11, 12, 13, 14, 15, 16, 17, 20, 21, 22, 23, 24, 25, 26, 27, 28, 29, 31 and 32 WITH ALVEOLOPLASTY;  Surgeon: Charlynne Pander, DDS;  Location: [facility] OR;  Service: Oral Surgery;  Laterality: N/A;  . ORIF FINGER / THUMB FRACTURE Right   . RIGHT/LEFT HEART CATH AND CORONARY ANGIOGRAPHY N/A 09/26/2017   Procedure: RIGHT/LEFT HEART CATH AND CORONARY ANGIOGRAPHY;  Surgeon: Dolores Patty, MD;  Location: [facility] INVASIVE CV LAB;  Service: Cardiovascular;  Laterality: N/A;    Social History   Socioeconomic History  . Marital status: Married    Spouse name: Not on file  . Number of children: 5  . Years of education: Not on file  . Highest education level: Not on file  Occupational History  . Occupation: Research officer, trade union  Social Needs  . Financial resource strain: Not on file  . Food insecurity:    Worry: Not on file    Inability: Not on file  . Transportation needs:    Medical: Not on file    Non-medical: Not on file  Tobacco Use  . Smoking status:  Never Smoker  . Smokeless tobacco: Never Used  Substance and Sexual Activity  . Alcohol use: No  . Drug use: No  . Sexual activity: Not on file  Lifestyle  . Physical activity:    Days per week: Not on file    Minutes per session: Not on file  . Stress: Not on file  Relationships  . Social connections:    Talks on phone: Not on file    Gets together: Not on file    Attends religious service: Not on file    Active member of club or organization: Not on file    Attends meetings of clubs or organizations: Not on file    Relationship status: Not on file  . Intimate partner violence:    Fear of current or ex partner: Not on file    Emotionally abused: Not on file    Physically  abused: Not on file    Forced sexual activity: Not on file  Other Topics Concern  . 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    Current Outpatient Medications on File Prior to Visit  Medication Sig Dispense Refill  . atorvastatin (LIPITOR) 40 MG tablet Take 2 tablets (80 mg total) by mouth at bedtime. 60 tablet 11  . carvedilol (COREG) 6.25 MG tablet Take 1 tablet (6.25 mg total) by mouth 2 (two) times daily with a meal. 180 tablet 3  . cetirizine (ZYRTEC ALLERGY) 10 MG tablet Take 1 tablet (10 mg total) by mouth daily. 30 tablet 11  . clopidogrel (PLAVIX) 75 MG tablet Take 1 tablet (75 mg total) by mouth daily. 30 tablet 11  . digoxin (LANOXIN) 0.125 MG tablet TAKE 1 TABLET BY MOUTH ONCE DAILY 30 tablet 11  . empagliflozin (JARDIANCE) 10 MG TABS tablet Take 10 mg by mouth daily. 30 tablet 6  . fluticasone (FLONASE) 50 MCG/ACT nasal spray Place 2 sprays into both nostrils daily. 16 g 11  . furosemide (LASIX) 40 MG tablet Take 1 tablet (40 mg total) by mouth daily. 180 tablet 3  . glucose blood test strip Pt uses one touch.  Test BID 100 each 12  . Lancets 28G MISC Use for glucose testing up to BID 100 each 12  . Potassium Chloride ER 20 MEQ TBCR Take 20 mEq by mouth daily. 30 tablet 3  . sacubitril-valsartan (ENTRESTO) 97-103 MG Take 1 tablet by mouth 2 (two) times daily. 60 tablet 11  . aspirin 81 MG EC tablet Take 1 tablet (81 mg total) by mouth daily. (Patient not taking: Reported on 03/11/2018) 90 tablet 3   No current facility-administered medications on file prior to visit.     Allergies  Allergen Reactions  . Nsaids Anaphylaxis and Other (See Comments)    Able to tolerate aspirin     Family History  Problem Relation Age of Onset  . Heart disease Mother        MI  . Heart failure Mother   . Diabetes Mother        ALSO IN MOST OF  HIS SIBLINGS; 2 UNLCES HAVE ALSO PASSED AWAY FROM DM  . Cardiomyopathy Mother   . Cancer - Ovarian Mother   . Heart disease Father   . Hypertension Father   . Diabetes Father     BP 136/78 (BP Location: Right  Arm, Patient Position: Sitting, Cuff Size: Normal)   Pulse 86   Ht 5\' 11"  (1.803 m)   Wt 272 lb (123.4 kg)   SpO2 99%   BMI 37.94 kg/m   Review of Systems Denies fever and hypoglycemia.      Objective:   Physical Exam VITAL SIGNS:  See vs page GENERAL: no distress Pulses: foot pulses are intact bilaterally.   MSK: no deformity of the feet or ankles.  CV: trace bilat edema of the legs Skin:  normal color and temp on the feet and ankles.  ulcer on the lat aspect of the right foot, is closed but foul-smelling.  Neuro: sensation is intact to touch on the feet and ankles.   EXTEMITIES:There is bilateral onychomycosis of the toenails.     Lab Results  Component Value Date   HGBA1C 10.3 (A) 03/11/2018      Assessment & Plan:  Foot ulcer: worse Insulin-requiring type 2 DM, with CVA: he needs increased rx. He needs a simpler insulin regimen  Patient Instructions  check your blood sugar twice a day.  vary the time of day when you check, between before the 3 meals, and at bedtime.  also check if you have symptoms of your blood sugar being too high or too low.  please keep a record of the readings and bring it to your next appointment here (or you can bring the meter itself).  You can write it on any piece of paper.  please call us sooner if your blood sugar goes below 70, or if you have a lot of readings over 200.  I have sent 2 prescriptions to your pharmacy: to change to a once a day insulin, and for the antibiotic pill.   Please see a wound care specialist.  you will receive a phone call, about a day and time for an appointment.  Here is a discount card, for the insulin.   Please keep the foot ulcer covered with antibiotic ointment, and a bandaid, until you see the  specialist.  Please come back for a follow-up appointment in 2 weeks.

## 2018-03-12 ENCOUNTER — Other Ambulatory Visit: Payer: Self-pay

## 2018-03-12 MED ORDER — BASAGLAR KWIKPEN 100 UNIT/ML ~~LOC~~ SOPN: PEN_INJECTOR | SUBCUTANEOUS | 3 refills | Status: DC

## 2018-03-19 ENCOUNTER — Encounter: Payer: Self-pay | Admitting: Endocrinology

## 2018-03-19 ENCOUNTER — Ambulatory Visit (INDEPENDENT_AMBULATORY_CARE_PROVIDER_SITE_OTHER): Payer: No Typology Code available for payment source | Admitting: Endocrinology

## 2018-03-19 VITALS — BP 138/90 | HR 84 | Ht 71.0 in | Wt 280.8 lb

## 2018-03-19 DIAGNOSIS — E118 Type 2 diabetes mellitus with unspecified complications: Secondary | ICD-10-CM | POA: Diagnosis not present

## 2018-03-19 MED ORDER — INSULIN GLARGINE 100 UNIT/ML SOLOSTAR PEN: 130.0000 [IU] | PEN_INJECTOR | SUBCUTANEOUS | 99 refills | Status: DC

## 2018-03-19 NOTE — Progress Notes (Signed)
Subjective:    Patient ID: Mitchell [name], male    DOB: [birth year], 47 y.o.   MRN: [id number]  HPI Pt returns for f/u of diabetes mellitus: DM type: Insulin-requiring type 2 Dx'ed: 1981 Complications: polyneuropathy, foot ulcer, and CVA Therapy: insulin since 2017 DKA: never Severe hypoglycemia: never Pancreatitis: never Pancreatic imaging: normal on 2012 CT.  Other: he declines multiple daily injections.  Interval history:  Pt says he never misses the insulin.  no cbg record, but states cbg's are in the 100's.   Past Medical History:  Diagnosis Date  . Bipolar disorder (HCC)   . CHF (congestive heart failure) (HCC)   . Chronic systolic heart failure (HCC) 11/2010   a. NICM 4/14 EF 20-25%, TR mild  . CVA (cerebral infarction)    No residual deficits  . Depression    PTSD,   . Diabetes mellitus    TYPE II; UNCONTROLLED BY HEMOGLOBIN A1c; STABLE AS  PER DISCHARGE  . Headache(784.0)   . 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 History of colonic polyps   . Hyperlipidemia   . Hypertension   . Myocardial infarction (HCC) 1987   (while playing football)  . Non-ischemic cardiomyopathy (HCC)    No ischemia on myoview, showed EF of 42%. 2D echo showed EF 50-55% with diatolic dysfunction in 2009  . Obesity   . OSA (obstructive sleep apnea)    Being set up again for c-pap  . Pneumonia   . Post-cardiac injury syndrome (HCC)    History of cardiac injury from blunt trauma  . PVCs (premature ventricular contractions)   . Schizophrenia (HCC)    Goes to [facility]  . SOB (shortness of breath)   . Stroke [facility]) 2005   some left side weakness  . Syncope    Recurrent, thought to be vasovagal. Also has h/o frequent PVCs.     Past Surgical History:  Procedure Laterality Date  . CARDIAC CATHETERIZATION  12/19/10   DIFFUSE NONOBSTRUCTIVE CAD; NONISCHEMIC CARDIOMYOPATHY; LEFT VENTRICULAR ANGIOGRAM WAS PERFORMED SECONDARY TO  ELEVATED LEFT VENTRICULAR FILLING PRESSURES  .  COLONOSCOPY W/ POLYPECTOMY    . MULTIPLE EXTRACTIONS WITH ALVEOLOPLASTY  01/27/2014   "all my teeth; 4 Quadrants of alveoloplasty  . MULTIPLE EXTRACTIONS WITH ALVEOLOPLASTY N/A 01/27/2014   Procedure: EXTRACTION OF TEETH #'1, 2, 3, 4, 5, 6, 7, 8, 9, 10, 11, 12, 13, 14, 15, 16, 17, 20, 21, 22, 23, 24, 25, 26, 27, 28, 29, 31 and 32 WITH ALVEOLOPLASTY;  Surgeon: Charlynne Pander, DDS;  Location: [facility] OR;  Service: Oral Surgery;  Laterality: N/A;  . ORIF FINGER / THUMB FRACTURE Right   . RIGHT/LEFT HEART CATH AND CORONARY ANGIOGRAPHY N/A 09/26/2017   Procedure: RIGHT/LEFT HEART CATH AND CORONARY ANGIOGRAPHY;  Surgeon: Dolores Patty, MD;  Location: [facility] INVASIVE CV LAB;  Service: Cardiovascular;  Laterality: N/A;    Social History   Socioeconomic History  . Marital status: Married    Spouse name: Not on file  . Number of children: 5  . Years of education: Not on file  . Highest education level: Not on file  Occupational History  . Occupation: Research officer, trade union  Social Needs  . Financial resource strain: Not on file  . Food insecurity:    Worry: Not on file    Inability: Not on file  . Transportation needs:    Medical: Not on file    Non-medical: Not on file  Tobacco Use  . Smoking status: Never  Smoker  . Smokeless tobacco: Never Used  Substance and Sexual Activity  . Alcohol use: No  . Drug use: No  . Sexual activity: Not on file  Lifestyle  . Physical activity:    Days per week: Not on file    Minutes per session: Not on file  . Stress: Not on file  Relationships  . Social connections:    Talks on phone: Not on file    Gets together: Not on file    Attends religious service: Not on file    Active member of club or organization: Not on file    Attends meetings of clubs or organizations: Not on file    Relationship status: Not on file  . Intimate partner violence:    Fear of current or ex partner: Not on file    Emotionally abused: Not on file    Physically abused: Not on file     Forced sexual activity: Not on file  Other Topics Concern  . Not on file  Social History Narrative   MARRIED, LIVES IN [place] WITH WIFE; GREW UP IN [place] AND USED TO BE A COOK; HE ENJOYS COOKING AND ENJOYS EATING A LOT OF PORK AND SALT.    Current Outpatient Medications on File Prior to Visit  Medication Sig Dispense Refill  . aspirin 81 MG EC tablet Take 1 tablet (81 mg total) by mouth daily. 90 tablet 3  . atorvastatin (LIPITOR) 40 MG tablet Take 2 tablets (80 mg total) by mouth at bedtime. 60 tablet 11  . carvedilol (COREG) 6.25 MG tablet Take 1 tablet (6.25 mg total) by mouth 2 (two) times daily with a meal. 180 tablet 3  . cetirizine (ZYRTEC ALLERGY) 10 MG tablet Take 1 tablet (10 mg total) by mouth daily. 30 tablet 11  . clopidogrel (PLAVIX) 75 MG tablet Take 1 tablet (75 mg total) by mouth daily. 30 tablet 11  . digoxin (LANOXIN) 0.125 MG tablet TAKE 1 TABLET BY MOUTH ONCE DAILY 30 tablet 11  . empagliflozin (JARDIANCE) 10 MG TABS tablet Take 10 mg by mouth daily. 30 tablet 6  . fluticasone (FLONASE) 50 MCG/ACT nasal spray Place 2 sprays into both nostrils daily. 16 g 11  . furosemide (LASIX) 40 MG tablet Take 1 tablet (40 mg total) by mouth daily. 180 tablet 3  . glucose blood test strip Pt uses one touch.  Test BID 100 each 12  . Lancets 28G MISC Use for glucose testing up to BID 100 each 12  . Potassium Chloride ER 20 MEQ TBCR Take 20 mEq by mouth daily. 30 tablet 3  . sacubitril-valsartan (ENTRESTO) 97-103 MG Take 1 tablet by mouth 2 (two) times daily. 60 tablet 11   No current facility-administered medications on file prior to visit.     Allergies  Allergen Reactions  . Nsaids Anaphylaxis and Other (See Comments)    Able to tolerate aspirin     Family History  Problem Relation Age of Onset  . Heart disease Mother        MI  . Heart failure Mother   . Diabetes Mother        ALSO IN MOST OF HIS SIBLINGS; 2 UNLCES HAVE ALSO PASSED AWAY FROM DM  .  Cardiomyopathy Mother   . Cancer - Ovarian Mother   . Heart disease Father   . Hypertension Father   . Diabetes Father     BP 138/90 (BP Location: Right Arm, Patient Position: Sitting, Cuff Size: Normal)  Pulse 84   Ht 5\' 11"  (1.803 m)   Wt 280 lb 12.8 oz (127.4 kg)   SpO2 98%   BMI 39.16 kg/m    Review of Systems He denies hypoglycemia    Objective:   Physical Exam VITAL SIGNS:  See vs page GENERAL: no distress Pulses: foot pulses are intact bilaterally.   MSK: no deformity of the feet or ankles.  CV: trace bilat edema of the legs Skin:  normal color and temp on the feet and ankles.  right foot is bandaged (sees wound care) Neuro: sensation is intact to touch on the feet and ankles.   EXTEMITIES:There is bilateral onychomycosis of the toenails.        Assessment & Plan:  Insulin-requiring type 2 DM, with CVA: glycemic control is apparently improved.  We discussed.  he declines fructosamine Non-ischemic cardiomyopathy: he should avoid hypoglycemia.   Patient Instructions  check your blood sugar twice a day.  vary the time of day when you check, between before the 3 meals, and at bedtime.  also check if you have symptoms of your blood sugar being too high or too low.  please keep a record of the readings and bring it to your next appointment here (or you can bring the meter itself).  You can write it on any piece of paper.  please call us sooner if your blood sugar goes below 70, or if you have a lot of readings over 200.  I have sent a prescription to your pharmacy: to increase the insulin to 130 units each morning.   Please come back for a follow-up appointment in 2 months.

## 2018-03-19 NOTE — Patient Instructions (Addendum)
check your blood sugar twice a day.  vary the time of day when you check, between before the 3 meals, and at bedtime.  also check if you have symptoms of your blood sugar being too high or too low.  please keep a record of the readings and bring it to your next appointment here (or you can bring the meter itself).  You can write it on any piece of paper.  please call us sooner if your blood sugar goes below 70, or if you have a lot of readings over 200.  I have sent a prescription to your pharmacy: to increase the insulin to 130 units each morning.   Please come back for a follow-up appointment in 2 months.

## 2018-03-20 ENCOUNTER — Other Ambulatory Visit: Payer: Self-pay

## 2018-03-20 MED ORDER — BASAGLAR KWIKPEN 100 UNIT/ML ~~LOC~~ SOPN: PEN_INJECTOR | SUBCUTANEOUS | 99 refills | Status: DC

## 2018-03-26 ENCOUNTER — Ambulatory Visit (HOSPITAL_COMMUNITY)
Admission: RE | Admit: 2018-03-26 | Discharge: 2018-03-26 | Disposition: A | Discharge status: Home / Self Care | Payer: No Typology Code available for payment source | Source: Ambulatory Visit | Attending: Internal Medicine | Admitting: Internal Medicine

## 2018-03-26 ENCOUNTER — Encounter (HOSPITAL_BASED_OUTPATIENT_CLINIC_OR_DEPARTMENT_OTHER): Payer: No Typology Code available for payment source | Attending: Internal Medicine

## 2018-03-26 ENCOUNTER — Other Ambulatory Visit (HOSPITAL_BASED_OUTPATIENT_CLINIC_OR_DEPARTMENT_OTHER): Payer: Self-pay | Admitting: Internal Medicine

## 2018-03-26 ENCOUNTER — Other Ambulatory Visit (HOSPITAL_COMMUNITY)
Admission: RE | Admit: 2018-03-26 | Discharge: 2018-03-26 | Disposition: A | Discharge status: Home / Self Care | Payer: No Typology Code available for payment source | Attending: Internal Medicine | Admitting: Internal Medicine

## 2018-03-26 ENCOUNTER — Ambulatory Visit: Payer: No Typology Code available for payment source | Admitting: Endocrinology

## 2018-03-26 DIAGNOSIS — Z794 Long term (current) use of insulin: Secondary | ICD-10-CM | POA: Insufficient documentation

## 2018-03-26 DIAGNOSIS — M2041 Other hammer toe(s) (acquired), right foot: Secondary | ICD-10-CM | POA: Diagnosis not present

## 2018-03-26 DIAGNOSIS — I110 Hypertensive heart disease with heart failure: Secondary | ICD-10-CM | POA: Diagnosis not present

## 2018-03-26 DIAGNOSIS — E08621 Diabetes mellitus due to underlying condition with foot ulcer: Secondary | ICD-10-CM | POA: Diagnosis not present

## 2018-03-26 DIAGNOSIS — E1142 Type 2 diabetes mellitus with diabetic polyneuropathy: Secondary | ICD-10-CM | POA: Insufficient documentation

## 2018-03-26 DIAGNOSIS — L97811 Non-pressure chronic ulcer of other part of right lower leg limited to breakdown of skin: Secondary | ICD-10-CM | POA: Diagnosis not present

## 2018-03-26 DIAGNOSIS — Z8673 Personal history of transient ischemic attack (TIA), and cerebral infarction without residual deficits: Secondary | ICD-10-CM | POA: Diagnosis not present

## 2018-03-26 DIAGNOSIS — I509 Heart failure, unspecified: Secondary | ICD-10-CM | POA: Insufficient documentation

## 2018-03-26 DIAGNOSIS — G4730 Sleep apnea, unspecified: Secondary | ICD-10-CM | POA: Diagnosis not present

## 2018-03-26 DIAGNOSIS — E11621 Type 2 diabetes mellitus with foot ulcer: Secondary | ICD-10-CM | POA: Diagnosis not present

## 2018-03-26 DIAGNOSIS — L97511 Non-pressure chronic ulcer of other part of right foot limited to breakdown of skin: Secondary | ICD-10-CM | POA: Diagnosis present

## 2018-03-26 DIAGNOSIS — I252 Old myocardial infarction: Secondary | ICD-10-CM | POA: Insufficient documentation

## 2018-03-29 LAB — AEROBIC CULTURE W GRAM STAIN (SUPERFICIAL SPECIMEN)

## 2018-03-29 LAB — AEROBIC CULTURE  (SUPERFICIAL SPECIMEN)

## 2018-04-02 ENCOUNTER — Encounter (HOSPITAL_BASED_OUTPATIENT_CLINIC_OR_DEPARTMENT_OTHER): Payer: No Typology Code available for payment source | Attending: Internal Medicine

## 2018-04-02 DIAGNOSIS — I509 Heart failure, unspecified: Secondary | ICD-10-CM | POA: Insufficient documentation

## 2018-04-02 DIAGNOSIS — I110 Hypertensive heart disease with heart failure: Secondary | ICD-10-CM | POA: Insufficient documentation

## 2018-04-02 DIAGNOSIS — G4730 Sleep apnea, unspecified: Secondary | ICD-10-CM | POA: Diagnosis not present

## 2018-04-02 DIAGNOSIS — L03115 Cellulitis of right lower limb: Secondary | ICD-10-CM | POA: Insufficient documentation

## 2018-04-02 DIAGNOSIS — B9620 Unspecified Escherichia coli [E. coli] as the cause of diseases classified elsewhere: Secondary | ICD-10-CM | POA: Diagnosis not present

## 2018-04-02 DIAGNOSIS — E1142 Type 2 diabetes mellitus with diabetic polyneuropathy: Secondary | ICD-10-CM | POA: Insufficient documentation

## 2018-04-02 DIAGNOSIS — E11621 Type 2 diabetes mellitus with foot ulcer: Secondary | ICD-10-CM | POA: Insufficient documentation

## 2018-04-02 DIAGNOSIS — B9689 Other specified bacterial agents as the cause of diseases classified elsewhere: Secondary | ICD-10-CM | POA: Diagnosis not present

## 2018-04-02 DIAGNOSIS — I252 Old myocardial infarction: Secondary | ICD-10-CM | POA: Insufficient documentation

## 2018-04-02 DIAGNOSIS — L97511 Non-pressure chronic ulcer of other part of right foot limited to breakdown of skin: Secondary | ICD-10-CM | POA: Diagnosis not present

## 2018-04-08 DIAGNOSIS — E11621 Type 2 diabetes mellitus with foot ulcer: Secondary | ICD-10-CM | POA: Diagnosis not present

## 2018-04-15 DIAGNOSIS — E11621 Type 2 diabetes mellitus with foot ulcer: Secondary | ICD-10-CM | POA: Diagnosis not present

## 2018-04-16 ENCOUNTER — Encounter (HOSPITAL_BASED_OUTPATIENT_CLINIC_OR_DEPARTMENT_OTHER): Payer: No Typology Code available for payment source

## 2018-04-22 DIAGNOSIS — E11621 Type 2 diabetes mellitus with foot ulcer: Secondary | ICD-10-CM | POA: Diagnosis not present

## 2018-04-27 ENCOUNTER — Emergency Department (HOSPITAL_BASED_OUTPATIENT_CLINIC_OR_DEPARTMENT_OTHER): Payer: No Typology Code available for payment source

## 2018-04-27 ENCOUNTER — Encounter (HOSPITAL_BASED_OUTPATIENT_CLINIC_OR_DEPARTMENT_OTHER): Payer: Self-pay | Admitting: Emergency Medicine

## 2018-04-27 ENCOUNTER — Emergency Department (HOSPITAL_BASED_OUTPATIENT_CLINIC_OR_DEPARTMENT_OTHER)
Admission: EM | Admit: 2018-04-27 | Discharge: 2018-04-27 | Disposition: A | Discharge status: Home / Self Care | Payer: No Typology Code available for payment source | Attending: Emergency Medicine | Admitting: Emergency Medicine

## 2018-04-27 ENCOUNTER — Other Ambulatory Visit: Payer: Self-pay

## 2018-04-27 ENCOUNTER — Other Ambulatory Visit: Payer: Self-pay | Admitting: Family Medicine

## 2018-04-27 DIAGNOSIS — R51 Headache: Secondary | ICD-10-CM | POA: Diagnosis present

## 2018-04-27 DIAGNOSIS — Z7902 Long term (current) use of antithrombotics/antiplatelets: Secondary | ICD-10-CM | POA: Insufficient documentation

## 2018-04-27 DIAGNOSIS — E119 Type 2 diabetes mellitus without complications: Secondary | ICD-10-CM | POA: Diagnosis not present

## 2018-04-27 DIAGNOSIS — G44229 Chronic tension-type headache, not intractable: Secondary | ICD-10-CM | POA: Diagnosis not present

## 2018-04-27 DIAGNOSIS — I252 Old myocardial infarction: Secondary | ICD-10-CM | POA: Diagnosis not present

## 2018-04-27 DIAGNOSIS — I5022 Chronic systolic (congestive) heart failure: Secondary | ICD-10-CM | POA: Diagnosis not present

## 2018-04-27 DIAGNOSIS — I10 Essential (primary) hypertension: Secondary | ICD-10-CM

## 2018-04-27 DIAGNOSIS — Z794 Long term (current) use of insulin: Secondary | ICD-10-CM | POA: Diagnosis not present

## 2018-04-27 DIAGNOSIS — I110 Hypertensive heart disease with heart failure: Secondary | ICD-10-CM | POA: Diagnosis not present

## 2018-04-27 DIAGNOSIS — Z7982 Long term (current) use of aspirin: Secondary | ICD-10-CM | POA: Diagnosis not present

## 2018-04-27 MED ORDER — PROCHLORPERAZINE EDISYLATE 10 MG/2ML IJ SOLN
10.0000 mg | Freq: Once | INTRAMUSCULAR | Status: AC
  Administered 2018-04-27: 10 mg via INTRAVENOUS
  Filled 2018-04-27: qty 2

## 2018-04-27 MED ORDER — DIPHENHYDRAMINE HCL 50 MG/ML IJ SOLN
25.0000 mg | Freq: Once | INTRAMUSCULAR | Status: AC
  Administered 2018-04-27: 25 mg via INTRAVENOUS
  Filled 2018-04-27: qty 1
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
## 2018-04-27 NOTE — ED Notes (Signed)
Patient verbalizes understanding of discharge instructions. Opportunity for questioning and answers were provided. Armband removed by staff, pt discharged from ED to his home via POV.
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
## 2018-04-27 NOTE — Discharge Instructions (Signed)
Follow-up with your primary care provider for further evaluation. Return to ED for worsening symptoms, head injuries or falls, numbness in arms or legs, neck pain with fever, vision changes.

## 2018-04-29 DIAGNOSIS — E11621 Type 2 diabetes mellitus with foot ulcer: Secondary | ICD-10-CM | POA: Diagnosis not present

## 2018-05-06 ENCOUNTER — Emergency Department (HOSPITAL_COMMUNITY)
Admission: EM | Admit: 2018-05-06 | Discharge: 2018-05-07 | Disposition: A | Discharge status: Home / Self Care | Payer: No Typology Code available for payment source | Attending: Emergency Medicine | Admitting: Emergency Medicine

## 2018-05-06 ENCOUNTER — Emergency Department (HOSPITAL_COMMUNITY): Payer: No Typology Code available for payment source

## 2018-05-06 DIAGNOSIS — Z794 Long term (current) use of insulin: Secondary | ICD-10-CM | POA: Diagnosis not present

## 2018-05-06 DIAGNOSIS — Z79899 Other long term (current) drug therapy: Secondary | ICD-10-CM | POA: Diagnosis not present

## 2018-05-06 DIAGNOSIS — I110 Hypertensive heart disease with heart failure: Secondary | ICD-10-CM | POA: Insufficient documentation

## 2018-05-06 DIAGNOSIS — I5022 Chronic systolic (congestive) heart failure: Secondary | ICD-10-CM | POA: Insufficient documentation

## 2018-05-06 DIAGNOSIS — Z7982 Long term (current) use of aspirin: Secondary | ICD-10-CM | POA: Diagnosis not present

## 2018-05-06 DIAGNOSIS — H532 Diplopia: Secondary | ICD-10-CM | POA: Diagnosis not present

## 2018-05-06 DIAGNOSIS — R109 Unspecified abdominal pain: Secondary | ICD-10-CM | POA: Diagnosis not present

## 2018-05-06 DIAGNOSIS — R51 Headache: Secondary | ICD-10-CM | POA: Insufficient documentation

## 2018-05-06 DIAGNOSIS — E119 Type 2 diabetes mellitus without complications: Secondary | ICD-10-CM | POA: Insufficient documentation

## 2018-05-06 DIAGNOSIS — R519 Headache, unspecified: Secondary | ICD-10-CM

## 2018-05-06 LAB — CBC WITH DIFFERENTIAL/PLATELET
Abs Immature Granulocytes: 0.1 10*3/uL (ref 0.0–0.1)
BASOS PCT: 0 %
Basophils Absolute: 0 10*3/uL (ref 0.0–0.1)
Eosinophils Absolute: 0.1 10*3/uL (ref 0.0–0.7)
Eosinophils Relative: 1 %
HEMATOCRIT: 41.4 % (ref 39.0–52.0)
Hemoglobin: 13.1 g/dL (ref 13.0–17.0)
Immature Granulocytes: 1 %
Lymphocytes Relative: 13 %
Lymphs Abs: 1.3 10*3/uL (ref 0.7–4.0)
MCH: 29.5 pg (ref 26.0–34.0)
MCHC: 31.6 g/dL (ref 30.0–36.0)
MCV: 93.2 fL (ref 78.0–100.0)
MONOS PCT: 10 %
Monocytes Absolute: 1 10*3/uL (ref 0.1–1.0)
NEUTROS ABS: 7.9 10*3/uL — AB (ref 1.7–7.7)
NEUTROS PCT: 75 %
PLATELETS: 161 10*3/uL (ref 150–400)
RBC: 4.44 MIL/uL (ref 4.22–5.81)
RDW: 12.2 % (ref 11.5–15.5)
WBC: 10.4 10*3/uL (ref 4.0–10.5)

## 2018-05-06 LAB — COMPREHENSIVE METABOLIC PANEL
ALK PHOS: 94 U/L (ref 38–126)
ALT: 33 U/L (ref 0–44)
AST: 34 U/L (ref 15–41)
Albumin: 3.4 g/dL — ABNORMAL LOW (ref 3.5–5.0)
Anion gap: 8 (ref 5–15)
BUN: 14 mg/dL (ref 6–20)
CALCIUM: 8.8 mg/dL — AB (ref 8.9–10.3)
CHLORIDE: 102 mmol/L (ref 98–111)
CO2: 27 mmol/L (ref 22–32)
CREATININE: 1.14 mg/dL (ref 0.61–1.24)
Glucose, Bld: 124 mg/dL — ABNORMAL HIGH (ref 70–99)
Potassium: 3.9 mmol/L (ref 3.5–5.1)
Sodium: 137 mmol/L (ref 135–145)
Total Bilirubin: 0.6 mg/dL (ref 0.3–1.2)
Total Protein: 6.8 g/dL (ref 6.5–8.1)

## 2018-05-06 LAB — LIPASE, BLOOD: Lipase: 39 U/L (ref 11–51)

## 2018-05-06 MED ORDER — METHOCARBAMOL 1000 MG/10ML IJ SOLN
500.0000 mg | Freq: Once | INTRAMUSCULAR | Status: DC
  Filled 2018-05-06: qty 5

## 2018-05-06 MED ORDER — PROCHLORPERAZINE EDISYLATE 10 MG/2ML IJ SOLN
10.0000 mg | Freq: Once | INTRAMUSCULAR | Status: AC
  Administered 2018-05-06: 10 mg via INTRAVENOUS
  Filled 2018-05-06: qty 2

## 2018-05-06 MED ORDER — METHOCARBAMOL 1000 MG/10ML IJ SOLN
500.0000 mg | Freq: Once | INTRAMUSCULAR | Status: AC
  Administered 2018-05-06: 500 mg via INTRAVENOUS
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
## 2018-05-06 NOTE — ED Provider Notes (Signed)
12:00 AM  Assumed care from Dr. Rubin Payor.  Patient is a 47 year old male who presented to the emergency department with headaches, binocular diplopia and left flank pain.  MRI of the brain shows no acute abnormality.  He does have severe stenosis of the left ICA.  Headache and diplopia have improved after Compazine.  Getting Robaxin for his flank pain.  Urine pending.  Labs unremarkable.  1:20 AM  Pt's shows small amount of blood but no infection.  Doubt kidney stone.  This seems to be musculoskeletal in nature as it is reproducible with movement and palpation.  Reports feeling better after Robaxin.  Headache and diplopia are still gone.  Will discharge with prescription of Robaxin for home for his left flank pain.  He is allergic to NSAIDs.  We did discuss the finding of severe stenosis of the left internal carotid artery and have recommended follow-up with neurology.  Patient comfortable with this plan.  At this time, I do not feel there is any life-threatening condition present. I have reviewed and discussed all results (EKG, imaging, lab, urine as appropriate) and exam findings with patient/family. I have reviewed nursing notes and appropriate previous records.  I feel the patient is safe to be discharged home without further emergent workup and can continue workup as an outpatient as needed. Discussed usual and customary return precautions. Patient/family verbalize understanding and are comfortable with this plan.  Outpatient follow-up has been provided if needed. All questions have been answered.    Ward, Layla Maw, DO 05/07/18 [phone]

## 2018-05-06 NOTE — Discharge Instructions (Addendum)
You were found to have stenosis of the left internal carotid artery.  I recommend close follow-up with neurology as an outpatient.  Otherwise your MRI was normal.
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
## 2018-05-06 NOTE — ED Notes (Signed)
Patient transported to MRI 

## 2018-05-07 LAB — URINALYSIS, ROUTINE W REFLEX MICROSCOPIC
Bacteria, UA: NONE SEEN
Bilirubin Urine: NEGATIVE
GLUCOSE, UA: 50 mg/dL — AB
KETONES UR: NEGATIVE mg/dL
LEUKOCYTES UA: NEGATIVE
Nitrite: NEGATIVE
PH: 5 (ref 5.0–8.0)
Protein, ur: 30 mg/dL — AB
Specific Gravity, Urine: 1.019 (ref 1.005–1.030)

## 2018-05-07 MED ORDER — METHOCARBAMOL 500 MG PO TABS: 500.0000 mg | ORAL_TABLET | Freq: Three times a day (TID) | ORAL | 0 refills | Status: DC | PRN

## 2018-05-07 NOTE — ED Notes (Signed)
Pt asked again for urine sample. Sts unable to give one at this time but will take his home Lasix per Dr. Rubin Payor and be able to go in a few minutes.

## 2018-05-10 ENCOUNTER — Emergency Department (HOSPITAL_BASED_OUTPATIENT_CLINIC_OR_DEPARTMENT_OTHER): Payer: No Typology Code available for payment source

## 2018-05-10 ENCOUNTER — Emergency Department (HOSPITAL_BASED_OUTPATIENT_CLINIC_OR_DEPARTMENT_OTHER)
Admission: EM | Admit: 2018-05-10 | Discharge: 2018-05-10 | Disposition: A | Discharge status: Home / Self Care | Payer: No Typology Code available for payment source | Attending: Emergency Medicine | Admitting: Emergency Medicine

## 2018-05-10 ENCOUNTER — Encounter (HOSPITAL_BASED_OUTPATIENT_CLINIC_OR_DEPARTMENT_OTHER): Payer: Self-pay

## 2018-05-10 ENCOUNTER — Other Ambulatory Visit: Payer: Self-pay

## 2018-05-10 DIAGNOSIS — I110 Hypertensive heart disease with heart failure: Secondary | ICD-10-CM | POA: Diagnosis not present

## 2018-05-10 DIAGNOSIS — E119 Type 2 diabetes mellitus without complications: Secondary | ICD-10-CM | POA: Insufficient documentation

## 2018-05-10 DIAGNOSIS — M545 Low back pain, unspecified: Secondary | ICD-10-CM

## 2018-05-10 DIAGNOSIS — Z7982 Long term (current) use of aspirin: Secondary | ICD-10-CM | POA: Diagnosis not present

## 2018-05-10 DIAGNOSIS — Z79899 Other long term (current) drug therapy: Secondary | ICD-10-CM | POA: Insufficient documentation

## 2018-05-10 DIAGNOSIS — Z7902 Long term (current) use of antithrombotics/antiplatelets: Secondary | ICD-10-CM | POA: Diagnosis not present

## 2018-05-10 DIAGNOSIS — I5022 Chronic systolic (congestive) heart failure: Secondary | ICD-10-CM | POA: Insufficient documentation

## 2018-05-10 DIAGNOSIS — Z7984 Long term (current) use of oral hypoglycemic drugs: Secondary | ICD-10-CM | POA: Diagnosis not present

## 2018-05-10 DIAGNOSIS — I252 Old myocardial infarction: Secondary | ICD-10-CM | POA: Diagnosis not present

## 2018-05-10 DIAGNOSIS — Z8673 Personal history of transient ischemic attack (TIA), and cerebral infarction without residual deficits: Secondary | ICD-10-CM | POA: Diagnosis not present

## 2018-05-10 LAB — URINALYSIS, ROUTINE W REFLEX MICROSCOPIC
BILIRUBIN URINE: NEGATIVE
GLUCOSE, UA: 100 mg/dL — AB
KETONES UR: NEGATIVE mg/dL
Leukocytes, UA: NEGATIVE
Nitrite: NEGATIVE
PROTEIN: 30 mg/dL — AB
Specific Gravity, Urine: >1.03 — ABNORMAL HIGH (ref 1.005–1.030)
pH: 5.5 (ref 5.0–8.0)

## 2018-05-10 LAB — URINALYSIS, MICROSCOPIC (REFLEX)

## 2018-05-10 MED ORDER — LIDOCAINE 5 % EX PTCH: 1.0000 | MEDICATED_PATCH | CUTANEOUS | 0 refills | Status: DC

## 2018-05-10 MED ORDER — METHOCARBAMOL 500 MG PO TABS: 500.0000 mg | ORAL_TABLET | Freq: Three times a day (TID) | ORAL | 0 refills | Status: DC | PRN

## 2018-05-10 MED ORDER — METHOCARBAMOL 500 MG PO TABS
500.0000 mg | ORAL_TABLET | Freq: Once | ORAL | Status: AC
  Administered 2018-05-10: 500 mg via ORAL
  Filled 2018-05-10: qty 1

## 2018-05-10 NOTE — ED Notes (Signed)
PA at bedside.

## 2018-05-10 NOTE — ED Triage Notes (Signed)
C/o left lower back x 1 week-denies injury or hx of back pain-to triage in w/c-drove self to ED

## 2018-05-10 NOTE — ED Notes (Signed)
Patient is aware we need a urine sample. He states he will try in a urinal. Urinal is on the bedrail. He will call out when he has urine.

## 2018-05-10 NOTE — ED Notes (Signed)
Pt to XR at this time via stretcher

## 2018-05-10 NOTE — ED Provider Notes (Signed)
[facility] EMERGENCY DEPARTMENT Provider Note   CSN: [id number] Arrival date & time: 05/10/18  1921     History   Chief Complaint Chief Complaint  Patient presents with  . Back Pain    HPI Mitchell [name] is a 47 y.o. male.  The history is provided by the patient and medical records. No language interpreter was used.  Back Pain       47 year old male with history of recurrent strokes, CHF, bipolar, diabetes, depression, hypertension, cardiac disease, schizophrenia presenting complaining of back pain.  Patient reports atraumatic left lower back pain ongoing for the past week.  Pain described as a tightness sharp throbbing sensation worsening with movement especially with walking.  Pain is not associate with fever, urinary changes, bowel bladder incontinence no saddle anesthesia.  He denies any significant injury.  He does complain of some spasm.  He mention been seen in the ED for back pain as well as having headache and diplopia on 10/07 and was evaluated in the ED.  He mention he did not have any evaluation of his back which upsets him.  He did receive an MRI of his head and brain without acute finding.  He is here requesting for x-ray of his lower back.  He is able to emanate with a cane.  Patient also denies dysuria or hematuria.  Past Medical History:  Diagnosis Date  . Bipolar disorder (HCC)   . CHF (congestive heart failure) (HCC)   . Chronic systolic heart failure (HCC) 11/2010   a. NICM 4/14 EF 20-25%, TR mild  . CVA (cerebral infarction)    No residual deficits  . Depression    PTSD,   . Diabetes mellitus    TYPE II; UNCONTROLLED BY HEMOGLOBIN A1c; STABLE AS  PER DISCHARGE  . Headache(784.0)   . 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 History of colonic polyps   . Hyperlipidemia   . Hypertension   . Myocardial infarction (HCC) 1987   (while playing football)  . Non-ischemic cardiomyopathy (HCC)    No ischemia on myoview, showed EF of 42%. 2D echo showed  EF 50-55% with diatolic dysfunction in 2009  . Obesity   . OSA (obstructive sleep apnea)    Being set up again for c-pap  . Pneumonia   . Post-cardiac injury syndrome (HCC)    History of cardiac injury from blunt trauma  . PVCs (premature ventricular contractions)   . Schizophrenia (HCC)    Goes to [facility]  . SOB (shortness of breath)   . Stroke [facility]) 2005   some left side weakness  . Syncope    Recurrent, thought to be vasovagal. Also has h/o frequent PVCs.     Patient Active Problem List   Diagnosis Date Noted  . Foot ulcer (HCC) 03/11/2018  . Weakness 05/08/2017  . Leukocytosis 05/08/2017  . Stroke (HCC) 05/08/2017  . Slurred speech 05/08/2017  . Noncompliance 01/22/2014  . Chronic systolic heart failure (HCC) 10/02/2011  . Erectile dysfunction 10/02/2011  . Obstructive sleep apnea 10/11/2007  . Diabetes mellitus with complication (HCC) 10/10/2007  . HLD (hyperlipidemia) 10/10/2007  . HYPOKALEMIA 10/10/2007  . OBESITY 10/10/2007  . Essential hypertension 10/10/2007  . PREMATURE VENTRICULAR CONTRACTIONS 10/10/2007  . SYNCOPE 10/10/2007  . COLONIC POLYPS, HX OF 10/10/2007    Past Surgical History:  Procedure Laterality Date  . CARDIAC CATHETERIZATION  12/19/10   DIFFUSE NONOBSTRUCTIVE CAD; NONISCHEMIC CARDIOMYOPATHY; LEFT VENTRICULAR ANGIOGRAM WAS PERFORMED SECONDARY TO  ELEVATED  LEFT VENTRICULAR FILLING PRESSURES  . COLONOSCOPY W/ POLYPECTOMY    . MULTIPLE EXTRACTIONS WITH ALVEOLOPLASTY  01/27/2014   "all my teeth; 4 Quadrants of alveoloplasty  . MULTIPLE EXTRACTIONS WITH ALVEOLOPLASTY N/A 01/27/2014   Procedure: EXTRACTION OF TEETH #'1, 2, 3, 4, 5, 6, 7, 8, 9, 10, 11, 12, 13, 14, 15, 16, 17, 20, 21, 22, 23, 24, 25, 26, 27, 28, 29, 31 and 32 WITH ALVEOLOPLASTY;  Surgeon: Charlynne Pander, DDS;  Location: [facility] OR;  Service: Oral Surgery;  Laterality: N/A;  . ORIF FINGER / THUMB FRACTURE Right   . RIGHT/LEFT HEART CATH AND CORONARY ANGIOGRAPHY N/A  09/26/2017   Procedure: RIGHT/LEFT HEART CATH AND CORONARY ANGIOGRAPHY;  Surgeon: Dolores Patty, MD;  Location: [facility] INVASIVE CV LAB;  Service: Cardiovascular;  Laterality: N/A;        Home Medications    Prior to Admission medications   Medication Sig Start Date End Date Taking? Authorizing Provider  aspirin 81 MG EC tablet Take 1 tablet (81 mg total) by mouth daily. 05/23/17  Yes [name], Gwenlyn Found, MD  atorvastatin (LIPITOR) 40 MG tablet Take 2 tablets (80 mg total) by mouth at bedtime. 05/23/17  Yes [name], Gwenlyn Found, MD  carvedilol (COREG) 6.25 MG tablet Take 1 tablet (6.25 mg total) by mouth 2 (two) times daily with a meal. 04/29/18  Yes [name], Gwenlyn Found, MD  cetirizine (ZYRTEC ALLERGY) 10 MG tablet Take 1 tablet (10 mg total) by mouth daily. 09/13/17  Yes [name], Gwenlyn Found, MD  clopidogrel (PLAVIX) 75 MG tablet Take 1 tablet (75 mg total) by mouth daily. 05/23/17  Yes [name], Gwenlyn Found, MD  digoxin (LANOXIN) 0.125 MG tablet TAKE 1 TABLET BY MOUTH ONCE DAILY Patient taking differently: Take 0.125 mg by mouth daily.  10/24/17  Yes [name], Gwenlyn Found, MD  furosemide (LASIX) 40 MG tablet Take 1 tablet (40 mg total) by mouth daily. 10/23/17  Yes Bensimhon, [name], MD  methocarbamol (ROBAXIN) 500 MG tablet Take 1 tablet (500 mg total) by mouth every 8 (eight) hours as needed for muscle spasms. 05/07/18  Yes Ward, Layla Maw, DO  Potassium Chloride ER 20 MEQ TBCR Take 20 mEq by mouth daily. 10/02/16  Yes [name], Gwenlyn Found, MD  sacubitril-valsartan (ENTRESTO) 97-103 MG Take 1 tablet by mouth 2 (two) times daily. 09/19/17  Yes Graciella Freer, PA-C  empagliflozin (JARDIANCE) 10 MG TABS tablet Take 10 mg by mouth daily. 10/23/17   Bensimhon, [name], MD  fluticasone (FLONASE) 50 MCG/ACT nasal spray Place 2 sprays into both nostrils daily. 09/13/17   [name], Gwenlyn Found, MD  glucose blood test strip Pt uses one touch.  Test BID 11/02/16   [name], Gwenlyn Found, MD  Insulin Glargine (BASAGLAR  KWIKPEN) 100 UNIT/ML SOPN Inject 130 units into the skin every morning. Patient taking differently: Inject 130 Units into the skin daily.  03/20/18   Romero Belling, MD  Lancets 28G MISC Use for glucose testing up to BID 11/02/16   [name], Gwenlyn Found, MD    Family History Family History  Problem Relation Age of Onset  . Heart disease Mother        MI  . Heart failure Mother   . Diabetes Mother        ALSO IN MOST OF HIS SIBLINGS; 2 UNLCES HAVE ALSO PASSED AWAY FROM DM  . Cardiomyopathy Mother   . Cancer - Ovarian Mother   . Heart disease Father   . Hypertension Father   .  Diabetes Father     Social History Social History   Tobacco Use  . Smoking status: Never Smoker  . Smokeless tobacco: Never Used  Substance Use Topics  . Alcohol use: No  . Drug use: No     Allergies   Nsaids   Review of Systems Review of Systems  Musculoskeletal: Positive for back pain.  All other systems reviewed and are negative.    Physical Exam Updated Vital Signs BP (!) 165/97 (BP Location: Left Arm)   Pulse 94   Temp 98.6 F (37 C) (Oral)   Resp 18   SpO2 100%   Physical Exam  Constitutional: He appears well-developed and well-nourished. No distress.  HENT:  Head: Atraumatic.  Eyes: Conjunctivae are normal.  Neck: Neck supple.  Cardiovascular: Normal rate and regular rhythm.  Pulmonary/Chest: Effort normal and breath sounds normal.  Abdominal: Soft. Bowel sounds are normal. He exhibits no distension. There is no tenderness.  Musculoskeletal: He exhibits tenderness (Tenderness along left lumbar paraspinal muscle on palpation.  No CVA tenderness.  No overlying skin changes.  No significant midline spine tenderness except mild discomfort when palpating over L1 and L2.).  Neurological: He is alert.  Skin: No rash noted.  Psychiatric: He has a normal mood and affect.  Nursing note and vitals reviewed.    ED Treatments / Results  Labs (all labs ordered are listed, but only abnormal  results are displayed) Labs Reviewed  URINALYSIS, ROUTINE W REFLEX MICROSCOPIC - Abnormal; Notable for the following components:      Result Value   APPearance HAZY (*)    Specific Gravity, Urine >1.030 (*)    Glucose, UA 100 (*)    Hgb urine dipstick SMALL (*)    Protein, ur 30 (*)    All other components within normal limits  URINALYSIS, MICROSCOPIC (REFLEX) - Abnormal; Notable for the following components:   Bacteria, UA RARE (*)    All other components within normal limits    EKG None  Radiology Dg Lumbar Spine Complete  Result Date: 05/10/2018 CLINICAL DATA:  Acute onset low back pain for 1 week.  No injury. EXAM: LUMBAR SPINE - COMPLETE 4+ VIEW COMPARISON:  05/23/2017 FINDINGS: There is no evidence of lumbar spine fracture. Alignment is normal. Intervertebral disc spaces are maintained. IMPRESSION: Negative. Electronically Signed   By: Burman Nieves M.D.   On: 05/10/2018 23:27    Procedures Procedures (including critical care time)  Medications Ordered in ED Medications - No data to display   Initial Impression / Assessment and Plan / ED Course  I have reviewed the triage vital signs and the nursing notes.  Pertinent labs & imaging results that were available during my care of the patient were reviewed by me and considered in my medical decision making (see chart for details).     BP (!) 165/97 (BP Location: Left Arm)   Pulse 94   Temp 98.6 F (37 C) (Oral)   Resp 18   SpO2 100%    Final Clinical Impressions(s) / ED Diagnoses   Final diagnoses:  Acute left-sided low back pain, unspecified whether sciatica present    ED Discharge Orders         Ordered    methocarbamol (ROBAXIN) 500 MG tablet  Every 8 hours PRN     05/10/18 2312    lidocaine (LIDODERM) 5 %  Every 24 hours     05/10/18 2312         9:19 PM Patient here with  atraumatic left lower back pain reproducible on exam.  Patient was upset that he did not receive an x-ray of his lower back  after visiting the ED several days prior.  X-ray ordered.  Otherwise no red flags.  11:07 PM Lumbar spine x-ray unremarkable.  Patient able to ambulate.  Will prescribe muscle relaxant, and Lidoderm patch for support.  Outpatient follow-up recommended.  Patient is hypertensive with a blood pressure of 165/97, recommend having his blood pressure rechecked.  I have low suspicion for AAA or dissection.   Fayrene Helper, PA-C 05/10/18 2332    Tilden Fossa, MD 05/11/18 [phone]

## 2018-05-10 NOTE — Discharge Instructions (Addendum)
You have been evaluated for your back pain.  Xray of your back is normal.  Apply lidoderm patch directly over the affected area daily for pain.  Take muscle relaxant as needed.  Your blood pressure is elevated today, please have it recheck by your doctor next week.  Return if you have any concerns.

## 2018-05-15 ENCOUNTER — Ambulatory Visit (INDEPENDENT_AMBULATORY_CARE_PROVIDER_SITE_OTHER): Payer: No Typology Code available for payment source | Admitting: Internal Medicine

## 2018-05-15 ENCOUNTER — Encounter: Payer: Self-pay | Admitting: Internal Medicine

## 2018-05-15 ENCOUNTER — Ambulatory Visit: Payer: No Typology Code available for payment source | Admitting: Medical

## 2018-05-15 VITALS — BP 138/78 | HR 93 | Temp 98.3°F | Resp 16 | Ht 71.0 in | Wt 281.1 lb

## 2018-05-15 DIAGNOSIS — L97512 Non-pressure chronic ulcer of other part of right foot with fat layer exposed: Secondary | ICD-10-CM

## 2018-05-15 DIAGNOSIS — M546 Pain in thoracic spine: Secondary | ICD-10-CM | POA: Diagnosis not present

## 2018-05-15 DIAGNOSIS — E11621 Type 2 diabetes mellitus with foot ulcer: Secondary | ICD-10-CM

## 2018-05-15 DIAGNOSIS — E08621 Diabetes mellitus due to underlying condition with foot ulcer: Secondary | ICD-10-CM

## 2018-05-15 MED ORDER — AMOXICILLIN-POT CLAVULANATE 875-125 MG PO TABS: 1.0000 | ORAL_TABLET | Freq: Two times a day (BID) | ORAL | 0 refills | Status: DC

## 2018-05-15 MED ORDER — CYCLOBENZAPRINE HCL 10 MG PO TABS: 10.0000 mg | ORAL_TABLET | Freq: Two times a day (BID) | ORAL | 0 refills | Status: DC | PRN

## 2018-05-15 NOTE — Progress Notes (Signed)
Subjective:    Patient ID: Mitchell [name], male    DOB: [birth year], 47 y.o.   MRN: [id number]  DOS:  05/15/2018 Type of visit - description : acute Interval history: Recently went to the ER with left lower back left thoracic pain, work-up was negative. Today he reports that he continue with pain, he points mostly at the left thoracic area, see graphic.  Minimal to no pain at the lower back. Pain is worse with bending in certain positions but is steady otherwise. Denies any injury, fall.  No rash No fever chills.  No cough No abdominal pain No blood in the urine.  Also, had a diabetic foot ulcer, has been in a boot until recently, according to the patient the wound has resurfaced. Request antibiotics.   Review of Systems   Past Medical History:  Diagnosis Date  . Bipolar disorder (HCC)   . CHF (congestive heart failure) (HCC)   . Chronic systolic heart failure (HCC) 11/2010   a. NICM 4/14 EF 20-25%, TR mild  . CVA (cerebral infarction)    No residual deficits  . Depression    PTSD,   . Diabetes mellitus    TYPE II; UNCONTROLLED BY HEMOGLOBIN A1c; STABLE AS  PER DISCHARGE  . Headache(784.0)   . 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 History of colonic polyps   . Hyperlipidemia   . Hypertension   . Myocardial infarction (HCC) 1987   (while playing football)  . Non-ischemic cardiomyopathy (HCC)    No ischemia on myoview, showed EF of 42%. 2D echo showed EF 50-55% with diatolic dysfunction in 2009  . Obesity   . OSA (obstructive sleep apnea)    Being set up again for c-pap  . Pneumonia   . Post-cardiac injury syndrome (HCC)    History of cardiac injury from blunt trauma  . PVCs (premature ventricular contractions)   . Schizophrenia (HCC)    Goes to [facility]  . SOB (shortness of breath)   . Stroke [facility]) 2005   some left side weakness  . Syncope    Recurrent, thought to be vasovagal. Also has h/o frequent PVCs.     Past Surgical History:    Procedure Laterality Date  . CARDIAC CATHETERIZATION  12/19/10   DIFFUSE NONOBSTRUCTIVE CAD; NONISCHEMIC CARDIOMYOPATHY; LEFT VENTRICULAR ANGIOGRAM WAS PERFORMED SECONDARY TO  ELEVATED LEFT VENTRICULAR FILLING PRESSURES  . COLONOSCOPY W/ POLYPECTOMY    . MULTIPLE EXTRACTIONS WITH ALVEOLOPLASTY  01/27/2014   "all my teeth; 4 Quadrants of alveoloplasty  . MULTIPLE EXTRACTIONS WITH ALVEOLOPLASTY N/A 01/27/2014   Procedure: EXTRACTION OF TEETH #'1, 2, 3, 4, 5, 6, 7, 8, 9, 10, 11, 12, 13, 14, 15, 16, 17, 20, 21, 22, 23, 24, 25, 26, 27, 28, 29, 31 and 32 WITH ALVEOLOPLASTY;  Surgeon: Charlynne Pander, DDS;  Location: [facility] OR;  Service: Oral Surgery;  Laterality: N/A;  . ORIF FINGER / THUMB FRACTURE Right   . RIGHT/LEFT HEART CATH AND CORONARY ANGIOGRAPHY N/A 09/26/2017   Procedure: RIGHT/LEFT HEART CATH AND CORONARY ANGIOGRAPHY;  Surgeon: Dolores Patty, MD;  Location: [facility] INVASIVE CV LAB;  Service: Cardiovascular;  Laterality: N/A;    Social History   Socioeconomic History  . Marital status: Married    Spouse name: Not on file  . Number of children: 5  . Years of education: Not on file  . Highest education level: Not on file  Occupational History  . Occupation: Research officer, trade union  Social Needs  .  Financial resource strain: Not on file  . Food insecurity:    Worry: Not on file    Inability: Not on file  . Transportation needs:    Medical: Not on file    Non-medical: Not on file  Tobacco Use  . Smoking status: Never Smoker  . Smokeless tobacco: Never Used  Substance and Sexual Activity  . Alcohol use: No  . Drug use: No  . Sexual activity: Not on file  Lifestyle  . Physical activity:    Days per week: Not on file    Minutes per session: Not on file  . Stress: Not on file  Relationships  . Social connections:    Talks on phone: Not on file    Gets together: Not on file    Attends religious service: Not on file    Active member of club or organization: Not on file    Attends meetings  of clubs or organizations: Not on file    Relationship status: Not on file  . Intimate partner violence:    Fear of current or ex partner: Not on file    Emotionally abused: Not on file    Physically abused: Not on file    Forced sexual activity: Not on file  Other Topics Concern  . Not on file  Social History Narrative   MARRIED, LIVES IN [place] WITH WIFE; GREW UP IN [place] AND USED TO BE A COOK; HE ENJOYS COOKING AND ENJOYS EATING A LOT OF PORK AND SALT.      Allergies as of 05/15/2018      Reactions   Nsaids Anaphylaxis, Other (See Comments)   Able to tolerate aspirin       Medication List        Accurate as of 05/15/18 11:59 PM. Always use your most recent med list.          amoxicillin-clavulanate 875-125 MG tablet Commonly known as:  AUGMENTIN Take 1 tablet by mouth 2 (two) times daily.   aspirin 81 MG EC tablet Take 1 tablet (81 mg total) by mouth daily.   atorvastatin 40 MG tablet Commonly known as:  LIPITOR Take 2 tablets (80 mg total) by mouth at bedtime.   BASAGLAR KWIKPEN 100 UNIT/ML Sopn Inject 130 units into the skin every morning.   carvedilol 6.25 MG tablet Commonly known as:  COREG Take 1 tablet (6.25 mg total) by mouth 2 (two) times daily with a meal.   cetirizine 10 MG tablet Commonly known as:  ZYRTEC Take 1 tablet (10 mg total) by mouth daily.   clopidogrel 75 MG tablet Commonly known as:  PLAVIX Take 1 tablet (75 mg total) by mouth daily.   cyclobenzaprine 10 MG tablet Commonly known as:  FLEXERIL Take 1 tablet (10 mg total) by mouth 2 (two) times daily as needed for muscle spasms.   digoxin 0.125 MG tablet Commonly known as:  LANOXIN TAKE 1 TABLET BY MOUTH ONCE DAILY   empagliflozin 10 MG Tabs tablet Commonly known as:  JARDIANCE Take 10 mg by mouth daily.   fluticasone 50 MCG/ACT nasal spray Commonly known as:  FLONASE Place 2 sprays into both nostrils daily.   furosemide 40 MG tablet Commonly known as:   LASIX Take 1 tablet (40 mg total) by mouth daily.   glucose blood test strip Pt uses one touch.  Test BID   Lancets 28G Misc Use for glucose testing up to BID   lidocaine 5 % Commonly known as:  LIDODERM  Place 1 patch onto the skin daily. Remove & Discard patch within 12 hours or as directed by MD   Potassium Chloride ER 20 MEQ Tbcr Take 20 mEq by mouth daily.   sacubitril-valsartan 97-103 MG Commonly known as:  ENTRESTO Take 1 tablet by mouth 2 (two) times daily.          Objective:   Physical Exam  Musculoskeletal:       Back:       Arms:  BP 138/78 (BP Location: Right Arm, Patient Position: Sitting, Cuff Size: Normal)   Pulse 93   Temp 98.3 F (36.8 C) (Oral)   Resp 16   Ht 5\' 11"  (1.803 m)   Wt 281 lb 2 oz (127.5 kg)   SpO2 91%   BMI 39.21 kg/m  General:   Well developed, NAD, see BMI.  HEENT:  Normocephalic . Face symmetric, atraumatic Lungs:  CTA B Normal respiratory effort, no intercostal retractions, no accessory muscle use. Heart: RRR,  no murmur.  No pretibial edema bilaterally  Skin: Not pale. Not jaundice Neurologic:  alert & oriented X3.  Speech normal, gait appropriate for age and unassisted Psych--  Cognition and judgment appear intact.  Cooperative with normal attention span and concentration.  Behavior appropriate. No anxious or depressed appearing.        Assessment & Plan:   47 year old gentleman PMH includes  CHF, HTN, previous stroke, DM, foot ulcer, high cholesterol presents with:  Thoracic pain: This is going on for approximately 10 days, was seen in the ER 05/10/2018, a lumbar x-ray was negative, urinalysis negative, was prescribed methocarbamol which is not helping.  He is also taking ibuprofen (NSAIDs in his allergy list). ? MSK issue, ?zoster sine herpete  Plan: Chest x-ray for completeness, change to Flexeril, Tylenol, avoid NSAIDs, if not better consider a Ortho referral.  Diabetic foot ulcer: He already contact the  wound care center, he has an appointment in 6 days.  Request an antibiotic which is not unreasonable.  Prescribed Augmentin.  Continue local care.  ER if fever, chills or worsening. Dressing change it today

## 2018-05-15 NOTE — Patient Instructions (Addendum)
  STOP BY THE FIRST FLOOR:  get the XR   Stop methocarbamol Start Flexeril, watch for drowsiness  Tylenol  500 mg OTC 2 tabs a day every 8 hours as needed for pain  Avoid taking ibuprofen, naproxen or similar medications that are anti-inflammatories  Call if not gradually better   Start Augmentin, an antibiotic.  Follow-up with the wound care center as planned.  ER if the wound looks much worse, you have fever or chills.

## 2018-05-15 NOTE — Progress Notes (Signed)
Pre visit review using our clinic review tool, if applicable. No additional management support is needed unless otherwise documented below in the visit note. 

## 2018-05-20 ENCOUNTER — Ambulatory Visit (INDEPENDENT_AMBULATORY_CARE_PROVIDER_SITE_OTHER): Payer: No Typology Code available for payment source | Admitting: Endocrinology

## 2018-05-20 ENCOUNTER — Encounter: Payer: Self-pay | Admitting: Endocrinology

## 2018-05-20 VITALS — BP 124/88 | HR 90 | Ht 71.0 in | Wt 286.8 lb

## 2018-05-20 DIAGNOSIS — E118 Type 2 diabetes mellitus with unspecified complications: Secondary | ICD-10-CM | POA: Diagnosis not present

## 2018-05-20 LAB — POCT GLYCOSYLATED HEMOGLOBIN (HGB A1C): HEMOGLOBIN A1C: 8.4 % — AB (ref 4.0–5.6)

## 2018-05-20 MED ORDER — GLUCOSE BLOOD VI STRP: 1.0000 | ORAL_STRIP | Freq: Two times a day (BID) | 3 refills | Status: DC

## 2018-05-20 MED ORDER — BASAGLAR KWIKPEN 100 UNIT/ML ~~LOC~~ SOPN: 140.0000 [IU] | PEN_INJECTOR | SUBCUTANEOUS | 99 refills | Status: DC

## 2018-05-20 NOTE — Progress Notes (Signed)
Subjective:    Patient ID: Mitchell [name], male    DOB: March 12, 1971, 47 y.o.   MRN: [id number]  HPI Pt returns for f/u of diabetes mellitus: DM type: Insulin-requiring type 2 Dx'ed: 1981 Complications: polyneuropathy, foot ulcer, and CVA.   Therapy: insulin since 2017, and jardiance.   DKA: never Severe hypoglycemia: never Pancreatitis: never Pancreatic imaging: normal on 2012 CT.  Other: he declines multiple daily injections.  Interval history:  Pt says he never misses the insulin.  no cbg record, but states cbg's are still in the 100's.  Past Medical History:  Diagnosis Date  . Bipolar disorder (HCC)   . CHF (congestive heart failure) (HCC)   . Chronic systolic heart failure (HCC) 11/2010   a. NICM 4/14 EF 20-25%, TR mild  . CVA (cerebral infarction)    No residual deficits  . Depression    PTSD,   . Diabetes mellitus    TYPE II; UNCONTROLLED BY HEMOGLOBIN A1c; STABLE AS  PER DISCHARGE  . Headache(784.0)   . 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 History of colonic polyps   . Hyperlipidemia   . Hypertension   . Myocardial infarction (HCC) 1987   (while playing football)  . Non-ischemic cardiomyopathy (HCC)    No ischemia on myoview, showed EF of 42%. 2D echo showed EF 50-55% with diatolic dysfunction in 2009  . Obesity   . OSA (obstructive sleep apnea)    Being set up again for c-pap  . Pneumonia   . Post-cardiac injury syndrome (HCC)    History of cardiac injury from blunt trauma  . PVCs (premature ventricular contractions)   . Schizophrenia (HCC)    Goes to [facility]  . SOB (shortness of breath)   . Stroke [facility]) 2005   some left side weakness  . Syncope    Recurrent, thought to be vasovagal. Also has h/o frequent PVCs.     Past Surgical History:  Procedure Laterality Date  . CARDIAC CATHETERIZATION  12/19/10   DIFFUSE NONOBSTRUCTIVE CAD; NONISCHEMIC CARDIOMYOPATHY; LEFT VENTRICULAR ANGIOGRAM WAS PERFORMED SECONDARY TO  ELEVATED LEFT  VENTRICULAR FILLING PRESSURES  . COLONOSCOPY W/ POLYPECTOMY    . MULTIPLE EXTRACTIONS WITH ALVEOLOPLASTY  01/27/2014   "all my teeth; 4 Quadrants of alveoloplasty  . MULTIPLE EXTRACTIONS WITH ALVEOLOPLASTY N/A 01/27/2014   Procedure: EXTRACTION OF TEETH #'1, 2, 3, 4, 5, 6, 7, 8, 9, 10, 11, 12, 13, 14, 15, 16, 17, 20, 21, 22, 23, 24, 25, 26, 27, 28, 29, 31 and 32 WITH ALVEOLOPLASTY;  Surgeon: Charlynne Pander, DDS;  Location: [facility] OR;  Service: Oral Surgery;  Laterality: N/A;  . ORIF FINGER / THUMB FRACTURE Right   . RIGHT/LEFT HEART CATH AND CORONARY ANGIOGRAPHY N/A 09/26/2017   Procedure: RIGHT/LEFT HEART CATH AND CORONARY ANGIOGRAPHY;  Surgeon: Dolores Patty, MD;  Location: [facility] INVASIVE CV LAB;  Service: Cardiovascular;  Laterality: N/A;    Social History   Socioeconomic History  . Marital status: Married    Spouse name: Not on file  . Number of children: 5  . Years of education: Not on file  . Highest education level: Not on file  Occupational History  . Occupation: Research officer, trade union  Social Needs  . Financial resource strain: Not on file  . Food insecurity:    Worry: Not on file    Inability: Not on file  . Transportation needs:    Medical: Not on file    Non-medical: Not on file  Tobacco  Use  . Smoking status: Never Smoker  . Smokeless tobacco: Never Used  Substance and Sexual Activity  . Alcohol use: No  . Drug use: No  . Sexual activity: Not on file  Lifestyle  . Physical activity:    Days per week: Not on file    Minutes per session: Not on file  . Stress: Not on file  Relationships  . Social connections:    Talks on phone: Not on file    Gets together: Not on file    Attends religious service: Not on file    Active member of club or organization: Not on file    Attends meetings of clubs or organizations: Not on file    Relationship status: Not on file  . Intimate partner violence:    Fear of current or ex partner: Not on file    Emotionally abused: Not on file     Physically abused: Not on file    Forced sexual activity: Not on file  Other Topics Concern  . Not on file  Social History Narrative   MARRIED, LIVES IN [place] WITH WIFE; GREW UP IN [place] AND USED TO BE A COOK; HE ENJOYS COOKING AND ENJOYS EATING A LOT OF PORK AND SALT.    Current Outpatient Medications on File Prior to Visit  Medication Sig Dispense Refill  . aspirin 81 MG EC tablet Take 1 tablet (81 mg total) by mouth daily. 90 tablet 3  . atorvastatin (LIPITOR) 40 MG tablet Take 2 tablets (80 mg total) by mouth at bedtime. 60 tablet 11  . carvedilol (COREG) 6.25 MG tablet Take 1 tablet (6.25 mg total) by mouth 2 (two) times daily with a meal. 180 tablet 3  . cetirizine (ZYRTEC ALLERGY) 10 MG tablet Take 1 tablet (10 mg total) by mouth daily. 30 tablet 11  . clopidogrel (PLAVIX) 75 MG tablet Take 1 tablet (75 mg total) by mouth daily. 30 tablet 11  . cyclobenzaprine (FLEXERIL) 10 MG tablet Take 1 tablet (10 mg total) by mouth 2 (two) times daily as needed for muscle spasms. 20 tablet 0  . digoxin (LANOXIN) 0.125 MG tablet TAKE 1 TABLET BY MOUTH ONCE DAILY (Patient taking differently: Take 0.125 mg by mouth daily. ) 30 tablet 11  . empagliflozin (JARDIANCE) 10 MG TABS tablet Take 10 mg by mouth daily. 30 tablet 6  . fluticasone (FLONASE) 50 MCG/ACT nasal spray Place 2 sprays into both nostrils daily. 16 g 11  . furosemide (LASIX) 40 MG tablet Take 1 tablet (40 mg total) by mouth daily. 180 tablet 3  . glucose blood test strip Pt uses one touch.  Test BID 100 each 12  . Lancets 28G MISC Use for glucose testing up to BID 100 each 12  . Potassium Chloride ER 20 MEQ TBCR Take 20 mEq by mouth daily. 30 tablet 3  . sacubitril-valsartan (ENTRESTO) 97-103 MG Take 1 tablet by mouth 2 (two) times daily. 60 tablet 11  . lidocaine (LIDODERM) 5 % Place 1 patch onto the skin daily. Remove & Discard patch within 12 hours or as directed by MD (Patient not taking: Reported on 05/20/2018) 30 patch  0   No current facility-administered medications on file prior to visit.     Allergies  Allergen Reactions  . Nsaids Anaphylaxis and Other (See Comments)    Able to tolerate aspirin     Family History  Problem Relation Age of Onset  . Heart disease Mother  MI  . Heart failure Mother   . Diabetes Mother        ALSO IN MOST OF HIS SIBLINGS; 2 UNLCES HAVE ALSO PASSED AWAY FROM DM  . Cardiomyopathy Mother   . Cancer - Ovarian Mother   . Heart disease Father   . Hypertension Father   . Diabetes Father     BP 124/88 (BP Location: Right Arm, Patient Position: Sitting, Cuff Size: Large)   Pulse 90   Ht 5\' 11"  (1.803 m)   Wt 286 lb 12.8 oz (130.1 kg)   SpO2 95%   BMI 40.00 kg/m    Review of Systems He denies hypoglycemia.      Objective:   Physical Exam VITAL SIGNS:  See vs page GENERAL: no distress Pulses: foot pulses are intact bilaterally.   MSK: no deformity of the feet or ankles.  CV: trace bilat edema of the legs.   Skin:  normal color and temp on the feet and ankles.  right foot is still bandaged (sees wound care) Neuro: sensation is intact to touch on the feet, but decreased from normal.   EXTEMITIES:There is bilateral onychomycosis of the toenails.    A1c=8.4%  Lab Results  Component Value Date   CREATININE 1.14 05/06/2018   BUN 14 05/06/2018   NA 137 05/06/2018   K 3.9 05/06/2018   CL 102 05/06/2018   CO2 27 05/06/2018      Assessment & Plan:  Insulin-requiring type 2 DM, with polyneuropathy: he needs increased rx   Patient Instructions  check your blood sugar twice a day.  vary the time of day when you check, between before the 3 meals, and at bedtime.  also check if you have symptoms of your blood sugar being too high or too low.  please keep a record of the readings and bring it to your next appointment here (or you can bring the meter itself).  You can write it on any piece of paper.  please call us sooner if your blood sugar goes below 70,  or if you have a lot of readings over 200.  I have sent a prescription to your pharmacy: to increase the insulin to 140 units each morning.   Here is a new meter.  I have sent a prescription to your pharmacy, for strips.   Please come back for a follow-up appointment in 3 months.

## 2018-05-20 NOTE — Patient Instructions (Addendum)
check your blood sugar twice a day.  vary the time of day when you check, between before the 3 meals, and at bedtime.  also check if you have symptoms of your blood sugar being too high or too low.  please keep a record of the readings and bring it to your next appointment here (or you can bring the meter itself).  You can write it on any piece of paper.  please call us sooner if your blood sugar goes below 70, or if you have a lot of readings over 200.  I have sent a prescription to your pharmacy: to increase the insulin to 140 units each morning.   Here is a new meter.  I have sent a prescription to your pharmacy, for strips.   Please come back for a follow-up appointment in 3 months.

## 2018-05-21 ENCOUNTER — Other Ambulatory Visit (HOSPITAL_COMMUNITY)
Admission: RE | Admit: 2018-05-21 | Discharge: 2018-05-21 | Disposition: A | Discharge status: Home / Self Care | Payer: No Typology Code available for payment source | Source: Other Acute Inpatient Hospital | Attending: Internal Medicine | Admitting: Internal Medicine

## 2018-05-21 ENCOUNTER — Encounter (HOSPITAL_BASED_OUTPATIENT_CLINIC_OR_DEPARTMENT_OTHER): Payer: No Typology Code available for payment source | Attending: Internal Medicine

## 2018-05-21 ENCOUNTER — Other Ambulatory Visit (HOSPITAL_BASED_OUTPATIENT_CLINIC_OR_DEPARTMENT_OTHER): Payer: Self-pay | Admitting: Internal Medicine

## 2018-05-21 DIAGNOSIS — E1142 Type 2 diabetes mellitus with diabetic polyneuropathy: Secondary | ICD-10-CM | POA: Insufficient documentation

## 2018-05-21 DIAGNOSIS — E1169 Type 2 diabetes mellitus with other specified complication: Secondary | ICD-10-CM | POA: Insufficient documentation

## 2018-05-21 DIAGNOSIS — E11621 Type 2 diabetes mellitus with foot ulcer: Secondary | ICD-10-CM | POA: Insufficient documentation

## 2018-05-21 DIAGNOSIS — M86471 Chronic osteomyelitis with draining sinus, right ankle and foot: Secondary | ICD-10-CM | POA: Insufficient documentation

## 2018-05-21 DIAGNOSIS — I10 Essential (primary) hypertension: Secondary | ICD-10-CM | POA: Diagnosis not present

## 2018-05-21 DIAGNOSIS — Z794 Long term (current) use of insulin: Secondary | ICD-10-CM | POA: Diagnosis not present

## 2018-05-21 DIAGNOSIS — A4901 Methicillin susceptible Staphylococcus aureus infection, unspecified site: Secondary | ICD-10-CM | POA: Insufficient documentation

## 2018-05-21 DIAGNOSIS — L97519 Non-pressure chronic ulcer of other part of right foot with unspecified severity: Secondary | ICD-10-CM

## 2018-05-21 DIAGNOSIS — G4730 Sleep apnea, unspecified: Secondary | ICD-10-CM | POA: Insufficient documentation

## 2018-05-21 DIAGNOSIS — L97516 Non-pressure chronic ulcer of other part of right foot with bone involvement without evidence of necrosis: Secondary | ICD-10-CM | POA: Diagnosis not present

## 2018-05-21 DIAGNOSIS — I252 Old myocardial infarction: Secondary | ICD-10-CM | POA: Insufficient documentation

## 2018-05-23 DIAGNOSIS — E11621 Type 2 diabetes mellitus with foot ulcer: Secondary | ICD-10-CM | POA: Diagnosis not present

## 2018-05-24 ENCOUNTER — Ambulatory Visit (HOSPITAL_COMMUNITY)
Admission: RE | Admit: 2018-05-24 | Discharge: 2018-05-24 | Disposition: A | Discharge status: Home / Self Care | Payer: No Typology Code available for payment source | Source: Ambulatory Visit | Attending: Internal Medicine | Admitting: Internal Medicine

## 2018-05-24 DIAGNOSIS — E11621 Type 2 diabetes mellitus with foot ulcer: Secondary | ICD-10-CM | POA: Insufficient documentation

## 2018-05-24 DIAGNOSIS — L97519 Non-pressure chronic ulcer of other part of right foot with unspecified severity: Secondary | ICD-10-CM | POA: Diagnosis present

## 2018-05-24 DIAGNOSIS — M869 Osteomyelitis, unspecified: Secondary | ICD-10-CM | POA: Diagnosis not present

## 2018-05-24 MED ORDER — GADOBUTROL 1 MMOL/ML IV SOLN
10.0000 mL | Freq: Once | INTRAVENOUS | Status: AC | PRN
  Administered 2018-05-24: 10 mL via INTRAVENOUS

## 2018-05-27 LAB — AEROBIC CULTURE W GRAM STAIN (SUPERFICIAL SPECIMEN): Gram Stain: NONE SEEN

## 2018-05-27 LAB — AEROBIC CULTURE  (SUPERFICIAL SPECIMEN)

## 2018-05-27 LAB — POCT I-STAT CREATININE: Creatinine, Ser: 1.1 mg/dL (ref 0.61–1.24)

## 2018-05-28 ENCOUNTER — Other Ambulatory Visit: Payer: Self-pay | Admitting: Family Medicine

## 2018-05-28 ENCOUNTER — Other Ambulatory Visit (HOSPITAL_COMMUNITY)
Admission: RE | Admit: 2018-05-28 | Discharge: 2018-05-28 | Disposition: A | Discharge status: Home / Self Care | Payer: No Typology Code available for payment source | Source: Other Acute Inpatient Hospital | Attending: Internal Medicine | Admitting: Internal Medicine

## 2018-05-28 DIAGNOSIS — I5022 Chronic systolic (congestive) heart failure: Secondary | ICD-10-CM

## 2018-05-28 DIAGNOSIS — E11621 Type 2 diabetes mellitus with foot ulcer: Secondary | ICD-10-CM | POA: Diagnosis not present

## 2018-05-28 DIAGNOSIS — E782 Mixed hyperlipidemia: Secondary | ICD-10-CM

## 2018-05-28 DIAGNOSIS — I10 Essential (primary) hypertension: Secondary | ICD-10-CM

## 2018-05-28 DIAGNOSIS — Z8673 Personal history of transient ischemic attack (TIA), and cerebral infarction without residual deficits: Secondary | ICD-10-CM

## 2018-05-28 DIAGNOSIS — L97514 Non-pressure chronic ulcer of other part of right foot with necrosis of bone: Secondary | ICD-10-CM | POA: Diagnosis present

## 2018-05-28 LAB — CBC WITH DIFFERENTIAL/PLATELET
ABS IMMATURE GRANULOCYTES: 0.04 10*3/uL (ref 0.00–0.07)
BASOS PCT: 0 %
Basophils Absolute: 0 10*3/uL (ref 0.0–0.1)
Eosinophils Absolute: 0.1 10*3/uL (ref 0.0–0.5)
Eosinophils Relative: 1 %
HCT: 44.3 % (ref 39.0–52.0)
Hemoglobin: 13.9 g/dL (ref 13.0–17.0)
IMMATURE GRANULOCYTES: 0 %
Lymphocytes Relative: 17 %
Lymphs Abs: 1.7 10*3/uL (ref 0.7–4.0)
MCH: 29.2 pg (ref 26.0–34.0)
MCHC: 31.4 g/dL (ref 30.0–36.0)
MCV: 93.1 fL (ref 80.0–100.0)
Monocytes Absolute: 0.7 10*3/uL (ref 0.1–1.0)
Monocytes Relative: 7 %
NEUTROS ABS: 7.5 10*3/uL (ref 1.7–7.7)
NEUTROS PCT: 75 %
NRBC: 0 % (ref 0.0–0.2)
Platelets: 253 10*3/uL (ref 150–400)
RBC: 4.76 MIL/uL (ref 4.22–5.81)
RDW: 12.6 % (ref 11.5–15.5)
WBC: 10.1 10*3/uL (ref 4.0–10.5)

## 2018-05-28 LAB — BASIC METABOLIC PANEL
ANION GAP: 7 (ref 5–15)
BUN: 15 mg/dL (ref 6–20)
CALCIUM: 9.2 mg/dL (ref 8.9–10.3)
CHLORIDE: 102 mmol/L (ref 98–111)
CO2: 32 mmol/L (ref 22–32)
Creatinine, Ser: 1.08 mg/dL (ref 0.61–1.24)
GFR calc non Af Amer: >60 mL/min (ref >60)
GLUCOSE: 105 mg/dL — AB (ref 70–99)
POTASSIUM: 4.3 mmol/L (ref 3.5–5.1)
Sodium: 141 mmol/L (ref 135–145)

## 2018-05-28 LAB — C-REACTIVE PROTEIN: CRP: 1.4 mg/dL — ABNORMAL HIGH (ref <1.0)

## 2018-05-28 LAB — SEDIMENTATION RATE: Sed Rate: 29 mm/hr — ABNORMAL HIGH (ref 0–16)

## 2018-06-04 ENCOUNTER — Encounter (HOSPITAL_BASED_OUTPATIENT_CLINIC_OR_DEPARTMENT_OTHER): Payer: No Typology Code available for payment source | Attending: Internal Medicine

## 2018-06-04 DIAGNOSIS — E1169 Type 2 diabetes mellitus with other specified complication: Secondary | ICD-10-CM | POA: Diagnosis not present

## 2018-06-04 DIAGNOSIS — I252 Old myocardial infarction: Secondary | ICD-10-CM | POA: Diagnosis not present

## 2018-06-04 DIAGNOSIS — G4730 Sleep apnea, unspecified: Secondary | ICD-10-CM | POA: Insufficient documentation

## 2018-06-04 DIAGNOSIS — E11621 Type 2 diabetes mellitus with foot ulcer: Secondary | ICD-10-CM | POA: Diagnosis present

## 2018-06-04 DIAGNOSIS — L97512 Non-pressure chronic ulcer of other part of right foot with fat layer exposed: Secondary | ICD-10-CM | POA: Diagnosis not present

## 2018-06-04 DIAGNOSIS — I10 Essential (primary) hypertension: Secondary | ICD-10-CM | POA: Insufficient documentation

## 2018-06-04 DIAGNOSIS — Z794 Long term (current) use of insulin: Secondary | ICD-10-CM | POA: Insufficient documentation

## 2018-06-04 DIAGNOSIS — M86471 Chronic osteomyelitis with draining sinus, right ankle and foot: Secondary | ICD-10-CM | POA: Insufficient documentation

## 2018-06-04 DIAGNOSIS — E1142 Type 2 diabetes mellitus with diabetic polyneuropathy: Secondary | ICD-10-CM | POA: Diagnosis not present

## 2018-06-07 ENCOUNTER — Other Ambulatory Visit: Payer: Self-pay

## 2018-06-07 MED ORDER — ACCU-CHEK FASTCLIX LANCET KIT: PACK | 1 refills | Status: DC

## 2018-06-07 MED ORDER — ACCU-CHEK GUIDE ME W/DEVICE KIT: 1.0000 | PACK | Freq: Two times a day (BID) | 0 refills | Status: DC

## 2018-06-07 MED ORDER — GLUCOSE BLOOD VI STRP: ORAL_STRIP | 12 refills | Status: DC

## 2018-06-07 NOTE — Telephone Encounter (Signed)
Received notification from pt pharamcy indication pt insurance will only cover Accu Chek Guide kit, strips and Fastclix lancets. Previous Rx's dicontinued. New Rx's written and sent to [facility] as requested/ordered.

## 2018-06-11 DIAGNOSIS — E11621 Type 2 diabetes mellitus with foot ulcer: Secondary | ICD-10-CM | POA: Diagnosis not present

## 2018-06-12 ENCOUNTER — Ambulatory Visit: Payer: No Typology Code available for payment source | Admitting: Podiatry

## 2018-06-13 ENCOUNTER — Encounter: Payer: Self-pay | Admitting: Infectious Diseases

## 2018-06-13 ENCOUNTER — Ambulatory Visit (INDEPENDENT_AMBULATORY_CARE_PROVIDER_SITE_OTHER): Payer: No Typology Code available for payment source | Admitting: Infectious Diseases

## 2018-06-13 DIAGNOSIS — E118 Type 2 diabetes mellitus with unspecified complications: Secondary | ICD-10-CM

## 2018-06-13 DIAGNOSIS — L97512 Non-pressure chronic ulcer of other part of right foot with fat layer exposed: Secondary | ICD-10-CM | POA: Diagnosis not present

## 2018-06-13 DIAGNOSIS — I5022 Chronic systolic (congestive) heart failure: Secondary | ICD-10-CM

## 2018-06-13 NOTE — Assessment & Plan Note (Addendum)
Will place pic Start vanco/ceftriaxone Stop bactrim/flagyl Would ask Dr Logan Bores to please do a bone Cx when he has his surgery.  We discussed waiting til his bone has been "scraped" but I am doubtful that a cx would be helpful/worth waiting for as he has been on anbx ~ 2 weeks (per pt).  Appreciate podiatry f/u, I am concerned about his tunneling.  rtc in 1 month.

## 2018-06-13 NOTE — Assessment & Plan Note (Addendum)
States his most recent FSG was 125. Last A1C was 8.4 (down from 11).  Appreciate Dr George Hugh expertise.

## 2018-06-13 NOTE — Progress Notes (Signed)
   Subjective:    Patient ID: Mitchell [name], male    DOB: February 24, 1971, 47 y.o.   MRN: [id number]  HPI 47 yo M with hx of DM2 requiring insulin (since 1981), complicated by CVA (2019), CHF, and mental illness (schizophrenia or bipolar unclear)..  He has developed a ulcer on the sole of his R fifth MT (off and on for 2 years) which he attributes to having the wrong shoes. He has been seen at wound center. And had MRI of his foot on 05-24-18 showing osteomyelitis of 5th MT head. No abscess. He was started on bactrim and flagyl by WOC MD.   The past medical history, family history and social history were reviewed/updated in EPIC   Review of Systems  Constitutional: Negative for appetite change, chills, fever and unexpected weight change.  Eyes: Positive for visual disturbance.  Respiratory: Negative for cough and shortness of breath.   Cardiovascular: Negative for leg swelling.  Gastrointestinal: Negative for constipation and diarrhea.  Genitourinary: Negative for difficulty urinating.  Skin: Positive for wound.  Neurological: Negative for numbness.       Objective:   Physical Exam  Constitutional: He is oriented to person, place, and time. He appears well-developed and well-nourished.  HENT:  Mouth/Throat: No oropharyngeal exudate.  Eyes: Pupils are equal, round, and reactive to light. EOM are normal.  Neck: Normal range of motion. Neck supple.  Cardiovascular: Normal rate, regular rhythm and normal heart sounds.  Pulmonary/Chest: Effort normal and breath sounds normal.  Abdominal: Soft. Bowel sounds are normal. He exhibits no distension. There is no tenderness.  Musculoskeletal: He exhibits no edema.       Feet:  Lymphadenopathy:    He has no cervical adenopathy.  Neurological: He is alert and oriented to person, place, and time.       Assessment & Plan:

## 2018-06-13 NOTE — Assessment & Plan Note (Signed)
Appreciate Dr Bensimhon's f/u. He appears to be doing well. His weight is near baseline (270-280).

## 2018-06-14 ENCOUNTER — Other Ambulatory Visit (HOSPITAL_COMMUNITY): Payer: Self-pay | Admitting: Radiology

## 2018-06-14 ENCOUNTER — Encounter (HOSPITAL_COMMUNITY): Payer: No Typology Code available for payment source | Attending: Infectious Diseases

## 2018-06-14 ENCOUNTER — Telehealth: Payer: Self-pay

## 2018-06-14 ENCOUNTER — Other Ambulatory Visit: Payer: Self-pay | Admitting: Infectious Diseases

## 2018-06-14 ENCOUNTER — Other Ambulatory Visit (HOSPITAL_COMMUNITY): Payer: Self-pay | Admitting: *Deleted

## 2018-06-14 ENCOUNTER — Ambulatory Visit (HOSPITAL_COMMUNITY)
Admission: RE | Admit: 2018-06-14 | Discharge: 2018-06-14 | Disposition: A | Discharge status: Home / Self Care | Payer: No Typology Code available for payment source | Source: Ambulatory Visit | Attending: Infectious Diseases | Admitting: Infectious Diseases

## 2018-06-14 DIAGNOSIS — E1169 Type 2 diabetes mellitus with other specified complication: Secondary | ICD-10-CM | POA: Diagnosis not present

## 2018-06-14 DIAGNOSIS — L97519 Non-pressure chronic ulcer of other part of right foot with unspecified severity: Secondary | ICD-10-CM | POA: Diagnosis present

## 2018-06-14 DIAGNOSIS — L97512 Non-pressure chronic ulcer of other part of right foot with fat layer exposed: Secondary | ICD-10-CM

## 2018-06-14 DIAGNOSIS — M869 Osteomyelitis, unspecified: Secondary | ICD-10-CM | POA: Insufficient documentation

## 2018-06-14 DIAGNOSIS — E11621 Type 2 diabetes mellitus with foot ulcer: Secondary | ICD-10-CM | POA: Insufficient documentation

## 2018-06-14 DIAGNOSIS — Z794 Long term (current) use of insulin: Secondary | ICD-10-CM | POA: Insufficient documentation

## 2018-06-14 MED ORDER — HEPARIN SOD (PORK) LOCK FLUSH 100 UNIT/ML IV SOLN
INTRAVENOUS | Status: AC
  Administered 2018-06-14: 250 [IU]
  Filled 2018-06-14: qty 5

## 2018-06-14 MED ORDER — LIDOCAINE HCL 1 % IJ SOLN
INTRAMUSCULAR | Status: DC | PRN
  Administered 2018-06-14: 5 mL

## 2018-06-14 MED ORDER — LIDOCAINE HCL 1 % IJ SOLN
INTRAMUSCULAR | Status: AC
  Filled 2018-06-14: qty 20

## 2018-06-14 MED ORDER — SODIUM CHLORIDE 0.9 % IV SOLN
2.0000 g | Freq: Once | INTRAVENOUS | Status: AC
  Administered 2018-06-14: 2 g via INTRAVENOUS
  Filled 2018-06-14: qty 20

## 2018-06-14 MED ORDER — VANCOMYCIN HCL 10 G IV SOLR
1500.0000 mg | Freq: Two times a day (BID) | INTRAVENOUS | Status: DC
  Administered 2018-06-14: 1500 mg via INTRAVENOUS
  Filled 2018-06-14: qty 1500

## 2018-06-14 NOTE — Telephone Encounter (Signed)
Per Dr. Ninetta Lights Scheduled Picc line for patient. Faxed and confirmed received orders to [facility]ology, [facility], and short stay. Patient  is scheduled for 06/14/18 at 12:30 pm with interventional radiology, they are aware first dose to be given at short stay. Spoke with Laverne at short stay and confirmed receiving order for first dose picc line.

## 2018-06-14 NOTE — Procedures (Signed)
Successful placement of single lumen PICC line to left basilic vein. Length 50 cm Tip at lower SVC/RA No complications Ready for use.   Brayton El PA-C 06/14/2018 3:36 PM

## 2018-06-14 NOTE — Consult Note (Signed)
ABBREVIATED PHARMACY NOTE:  Vancomycin Protocol  Patient is a 47 y/o male with history of Insulin Dependent DM2.  Patient has developed an ulcer on the sole of his R fifth MT.  MRI shows osteomyelitis of 5th MT Head.    Following PICC line placement today, patient will be started on Vancomycin per Pharmacy protocol and Ceftriaxone IVPB.  Last doses of antibiotics will be 07/25/18.  Home Health to administer and manage therapy following initial doses today.  PLAN: 1. Vancomycin 1500 mg IV q 12 hours. 2. Vancomycin trough 06/18/18, then Home Health to manage. 3. Ceftriaxone 2 gm IV q 24 hours.  Velda Shell,  Pharm.D   06/14/2018,  1:49 PM

## 2018-06-17 ENCOUNTER — Telehealth: Payer: Self-pay | Admitting: Family Medicine

## 2018-06-17 ENCOUNTER — Other Ambulatory Visit: Payer: Self-pay

## 2018-06-17 ENCOUNTER — Ambulatory Visit (INDEPENDENT_AMBULATORY_CARE_PROVIDER_SITE_OTHER): Payer: No Typology Code available for payment source | Admitting: Podiatry

## 2018-06-17 ENCOUNTER — Other Ambulatory Visit (HOSPITAL_COMMUNITY)
Admission: RE | Admit: 2018-06-17 | Discharge: 2018-06-17 | Disposition: A | Discharge status: Home / Self Care | Payer: No Typology Code available for payment source | Source: Ambulatory Visit | Attending: Infectious Diseases | Admitting: Infectious Diseases

## 2018-06-17 ENCOUNTER — Encounter: Payer: Self-pay | Admitting: Podiatry

## 2018-06-17 DIAGNOSIS — R6889 Other general symptoms and signs: Secondary | ICD-10-CM | POA: Diagnosis present

## 2018-06-17 DIAGNOSIS — A0472 Enterocolitis due to Clostridium difficile, not specified as recurrent: Secondary | ICD-10-CM | POA: Diagnosis not present

## 2018-06-17 DIAGNOSIS — M86171 Other acute osteomyelitis, right ankle and foot: Secondary | ICD-10-CM | POA: Diagnosis not present

## 2018-06-17 DIAGNOSIS — R700 Elevated erythrocyte sedimentation rate: Secondary | ICD-10-CM | POA: Insufficient documentation

## 2018-06-17 DIAGNOSIS — L97512 Non-pressure chronic ulcer of other part of right foot with fat layer exposed: Secondary | ICD-10-CM | POA: Diagnosis not present

## 2018-06-17 DIAGNOSIS — I70235 Atherosclerosis of native arteries of right leg with ulceration of other part of foot: Secondary | ICD-10-CM | POA: Diagnosis not present

## 2018-06-17 DIAGNOSIS — E0843 Diabetes mellitus due to underlying condition with diabetic autonomic (poly)neuropathy: Secondary | ICD-10-CM | POA: Diagnosis not present

## 2018-06-17 LAB — COMPREHENSIVE METABOLIC PANEL
ALT: 16 U/L (ref 0–44)
AST: 21 U/L (ref 15–41)
Albumin: 3.5 g/dL (ref 3.5–5.0)
Alkaline Phosphatase: 74 U/L (ref 38–126)
Anion gap: 5 (ref 5–15)
BUN: 23 mg/dL — ABNORMAL HIGH (ref 6–20)
CHLORIDE: 109 mmol/L (ref 98–111)
CO2: 26 mmol/L (ref 22–32)
Calcium: 8.7 mg/dL — ABNORMAL LOW (ref 8.9–10.3)
Creatinine, Ser: 0.9 mg/dL (ref 0.61–1.24)
Glucose, Bld: 80 mg/dL (ref 70–99)
Potassium: 4.7 mmol/L (ref 3.5–5.1)
Sodium: 140 mmol/L (ref 135–145)
Total Bilirubin: 0.6 mg/dL (ref 0.3–1.2)
Total Protein: 7.4 g/dL (ref 6.5–8.1)

## 2018-06-17 LAB — CBC WITH DIFFERENTIAL/PLATELET
ABS IMMATURE GRANULOCYTES: 0.01 10*3/uL (ref 0.00–0.07)
Basophils Absolute: 0 10*3/uL (ref 0.0–0.1)
Basophils Relative: 0 %
EOS PCT: 2 %
Eosinophils Absolute: 0.2 10*3/uL (ref 0.0–0.5)
HEMATOCRIT: 40.1 % (ref 39.0–52.0)
HEMOGLOBIN: 12.8 g/dL — AB (ref 13.0–17.0)
Immature Granulocytes: 0 %
LYMPHS ABS: 1.3 10*3/uL (ref 0.7–4.0)
LYMPHS PCT: 13 %
MCH: 29.1 pg (ref 26.0–34.0)
MCHC: 31.9 g/dL (ref 30.0–36.0)
MCV: 91.1 fL (ref 80.0–100.0)
MONO ABS: 0.7 10*3/uL (ref 0.1–1.0)
Monocytes Relative: 7 %
NEUTROS ABS: 7.4 10*3/uL (ref 1.7–7.7)
Neutrophils Relative %: 78 %
Platelets: 159 10*3/uL (ref 150–400)
RBC: 4.4 MIL/uL (ref 4.22–5.81)
RDW: 12.5 % (ref 11.5–15.5)
WBC: 9.6 10*3/uL (ref 4.0–10.5)
nRBC: 0 % (ref 0.0–0.2)

## 2018-06-17 LAB — VANCOMYCIN, TROUGH: VANCOMYCIN TR: 23 ug/mL — AB (ref 15–20)

## 2018-06-17 LAB — SEDIMENTATION RATE: Sed Rate: 25 mm/hr — ABNORMAL HIGH (ref 0–16)

## 2018-06-17 LAB — C-REACTIVE PROTEIN: CRP: 0.8 mg/dL (ref <1.0)

## 2018-06-17 NOTE — Patient Instructions (Signed)
Pre-Operative Instructions  Congratulations, you have decided to take an important step towards improving your quality of life.  You can be assured that the doctors and staff at [facility] will be with you every step of the way.  Here are some important things you should know:  1. Plan to be at the surgery center/hospital at least 1 (one) hour prior to your scheduled time, unless otherwise directed by the surgical center/hospital staff.  You must have a responsible adult accompany you, remain during the surgery and drive you home.  Make sure you have directions to the surgical center/hospital to ensure you arrive on time. 2. If you are having surgery at [facility] or [facility], you will need a copy of your medical history and physical form from your family physician within one month prior to the date of surgery. We will give you a form for your primary physician to complete.  3. We make every effort to accommodate the date you request for surgery.  However, there are times where surgery dates or times have to be moved.  We will contact you as soon as possible if a change in schedule is required.   4. No aspirin/ibuprofen for one week before surgery.  If you are on aspirin, any non-steroidal anti-inflammatory medications (Mobic, Aleve, Ibuprofen) should not be taken seven (7) days prior to your surgery.  You make take Tylenol for pain prior to surgery.  5. Medications - If you are taking daily heart and blood pressure medications, seizure, reflux, allergy, asthma, anxiety, pain or diabetes medications, make sure you notify the surgery center/hospital before the day of surgery so they can tell you which medications you should take or avoid the day of surgery. 6. No food or drink after midnight the night before surgery unless directed otherwise by surgical center/hospital staff. 7. No alcoholic beverages 24-hours prior to surgery.  No smoking 24-hours prior or 24-hours after  surgery. 8. Wear loose pants or shorts. They should be loose enough to fit over bandages, boots, and casts. 9. Don't wear slip-on shoes. Sneakers are preferred. 10. Bring your boot with you to the surgery center/hospital.  Also bring crutches or a walker if your physician has prescribed it for you.  If you do not have this equipment, it will be provided for you after surgery. 11. If you have not been contacted by the surgery center/hospital by the day before your surgery, call to confirm the date and time of your surgery. 12. Leave-time from work may vary depending on the type of surgery you have.  Appropriate arrangements should be made prior to surgery with your employer. 13. Prescriptions will be provided immediately following surgery by your doctor.  Fill these as soon as possible after surgery and take the medication as directed. Pain medications will not be refilled on weekends and must be approved by the doctor. 14. Remove nail polish on the operative foot and avoid getting pedicures prior to surgery. 15. Wash the night before surgery.  The night before surgery wash the foot and leg well with water and the antibacterial soap provided. Be sure to pay special attention to beneath the toenails and in between the toes.  Wash for at least three (3) minutes. Rinse thoroughly with water and dry well with a towel.  Perform this wash unless told not to do so by your physician.  Enclosed: 1 Ice pack (please put in freezer the night before surgery)   1 Hibiclens skin cleaner     Pre-op instructions  If you have any questions regarding the instructions, please do not hesitate to call our office.  [place]: [address], [place], [place] [address] -- [phone]  [place]: [address] , [place] [address] -- [phone]  [place]: [address]  [place], [place] [address] -- [phone]  [place]: [address], [address], [place], [place] [address] -- [phone]  Website:  [email or web address] 

## 2018-06-17 NOTE — Telephone Encounter (Unsigned)
Copied from CRM [id number]. Topic: Quick Communication - Home Health Verbal Orders >> Jun 17, 2018 12:32 PM Marylen Ponto wrote: Caller/Agency: Lorene Dy with [facility] Callback Number: [phone] Requesting OT/PT/Skilled Nursing/Social Work: skilled nursing  Frequency: 2 times a week for 4 weeks

## 2018-06-17 NOTE — Telephone Encounter (Signed)
Patient dropped off form for surgical clearance for [facility]. Please fax to [facility] [phone]. Advise patient when completed. Hand delivered to [name].

## 2018-06-18 ENCOUNTER — Telehealth: Payer: Self-pay | Admitting: *Deleted

## 2018-06-18 DIAGNOSIS — E11621 Type 2 diabetes mellitus with foot ulcer: Secondary | ICD-10-CM | POA: Diagnosis not present

## 2018-06-18 NOTE — Progress Notes (Deleted)
[name] [address] [place], [place] [address] [phone]  Date:  06/20/2018   Name:  Mitchell [name]   DOB:  [birth year]   MRN:  [id number]  PCP:  [name], MD    Chief Complaint: No chief complaint on file.   History of Present Illness:  Mitchell [name] is a 47 y.o. very pleasant male patient who presents with the following:  Here today seeking an H and P form completion for upcoming operation History of foot ulcers, stroke, CHF, OSA, DM, hyperlipidemia,obesity Last seen here   Lab Results  Component Value Date   HGBA1C 8.4 (A) 05/20/2018   He is seeing ID for his infected foot- per last visit by Dr. [name] last week  Will place pic Start vanco/ceftriaxone Stop bactrim/flagyl Would ask Dr Amalia Hailey to please do a bone Cx when he has his surgery.  We discussed waiting til his bone has been "scraped" but I am doubtful that a cx would be helpful/worth waiting for as he has been on anbx ~ 2 weeks (per pt).  Appreciate podiatry f/u, I am concerned about his tunneling.    I last saw him in August for ER follow-up Dr. Loanne Drilling is his endocrinologist He is a CHF pt with Dr. Jacinto Reap   Flu: Microalbumin: Cholesterol panel is due  Patient Active Problem List   Diagnosis Date Noted  . Foot ulcer ([place]) 03/11/2018  . Weakness 05/08/2017  . Leukocytosis 05/08/2017  . Stroke ([place]) 05/08/2017  . Slurred speech 05/08/2017  . Noncompliance 01/22/2014  . Chronic systolic heart failure ([place]) 10/02/2011  . Erectile dysfunction 10/02/2011  . Obstructive sleep apnea 10/11/2007  . Diabetes mellitus with complication ([place]) 35/00/9381  . HLD (hyperlipidemia) 10/10/2007  . HYPOKALEMIA 10/10/2007  . OBESITY 10/10/2007  . Essential hypertension 10/10/2007  . PREMATURE VENTRICULAR CONTRACTIONS 10/10/2007  . SYNCOPE 10/10/2007  . COLONIC POLYPS, HX OF 10/10/2007    Past Medical History:  Diagnosis Date  . Bipolar  disorder ([place])   . CHF (congestive heart failure) ([place])   . Chronic systolic heart failure ([place]) 11/2010   a. NICM 4/14 EF 20-25%, TR mild  . CVA (cerebral infarction)    No residual deficits  . Depression    PTSD,   . Diabetes mellitus    TYPE II; UNCONTROLLED BY HEMOGLOBIN A1c; STABLE AS  PER DISCHARGE  . Headache(784.0)   . 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 History of colonic polyps   . Hyperlipidemia   . Hypertension   . Myocardial infarction ([place]) 1987   (while playing football)  . Non-ischemic cardiomyopathy (H[facility])    No ischemia on myoview, showed EF of 42%. 2D echo showed EF 82-99% with diatolic dysfunction in 3716  . Obesity   . OSA (obstructive sleep apnea)    Being set up again for c-pap  . Pneumonia   . Post-cardiac injury syndrome ([place])    History of cardiac injury from blunt trauma  . PVCs (premature ventricular contractions)   . Schizophrenia ([place])    Goes to [facility]  . SOB (shortness of breath)   . Stroke [facility]) 2005   some left side weakness  . Syncope    Recurrent, thought to be vasovagal. Also has h/o frequent PVCs.     Past Surgical History:  Procedure Laterality Date  . CARDIAC CATHETERIZATION  12/19/10   DIFFUSE NONOBSTRUCTIVE CAD; NONISCHEMIC CARDIOMYOPATHY; LEFT VENTRICULAR  ANGIOGRAM WAS PERFORMED SECONDARY TO  ELEVATED LEFT VENTRICULAR FILLING PRESSURES  . COLONOSCOPY W/ POLYPECTOMY    . MULTIPLE EXTRACTIONS WITH ALVEOLOPLASTY  01/27/2014   "all my teeth; 4 Quadrants of alveoloplasty  . MULTIPLE EXTRACTIONS WITH ALVEOLOPLASTY N/A 01/27/2014   Procedure: EXTRACTION OF TEETH #'1, 2, 3, 4, 5, 6, 7, 8, 9, 10, 11, 12, 13, 14, 15, 16, 17, 20, 21, 22, 23, 24, 25, 26, 27, 28, 29, 31 and 32 WITH ALVEOLOPLASTY;  Surgeon: Lenn Cal, DDS;  Location: [place];  Service: Oral Surgery;  Laterality: N/A;  . ORIF FINGER / THUMB FRACTURE Right   . RIGHT/LEFT HEART CATH AND CORONARY ANGIOGRAPHY N/A 09/26/2017   Procedure: RIGHT/LEFT HEART CATH  AND CORONARY ANGIOGRAPHY;  Surgeon: Jolaine Artist, MD;  Location: [facility] [facility];  Service: Cardiovascular;  Laterality: N/A;    Social History   Tobacco Use  . Smoking status: Never Smoker  . Smokeless tobacco: Never Used  Substance Use Topics  . Alcohol use: No  . Drug use: No    Family History  Problem Relation Age of Onset  . Heart disease Mother        MI  . Heart failure Mother   . Diabetes Mother        ALSO IN MOST OF HIS SIBLINGS; 2 UNLCES HAVE ALSO PASSED AWAY FROM DM  . Cardiomyopathy Mother   . Cancer - Ovarian Mother   . Heart disease Father   . Hypertension Father   . Diabetes Father   . Diabetes Sister   . Diabetes Brother     Allergies  Allergen Reactions  . Nsaids Anaphylaxis and Other (See Comments)    Able to tolerate aspirin     Medication list has been reviewed and updated.  Current Outpatient Medications on File Prior to Visit  Medication Sig Dispense Refill  . aspirin 81 MG EC tablet Take 1 tablet (81 mg total) by mouth daily. 90 tablet 3  . atorvastatin (LIPITOR) 40 MG tablet TAKE 2 TABLETS BY MOUTH AT BEDTIME 60 tablet 11  . Blood Glucose Monitoring Suppl (ACCU-CHEK GUIDE ME) w/Device KIT 1 each by Does not apply route 2 (two) times daily. Use to monitor glucose levels BID 1 kit 0  . carvedilol (COREG) 6.25 MG tablet Take 1 tablet (6.25 mg total) by mouth 2 (two) times daily with a meal. 180 tablet 3  . cetirizine (ZYRTEC ALLERGY) 10 MG tablet Take 1 tablet (10 mg total) by mouth daily. 30 tablet 11  . clopidogrel (PLAVIX) 75 MG tablet TAKE 1 TABLET BY MOUTH ONCE DAILY 30 tablet 11  . cyclobenzaprine (FLEXERIL) 10 MG tablet Take 1 tablet (10 mg total) by mouth 2 (two) times daily as needed for muscle spasms. 20 tablet 0  . digoxin (LANOXIN) 0.125 MG tablet TAKE 1 TABLET BY MOUTH ONCE DAILY (Patient taking differently: Take 0.125 mg by mouth daily. ) 30 tablet 11  . empagliflozin (JARDIANCE) 10 MG TABS tablet Take 10 mg by mouth daily.  30 tablet 6  . fluticasone (FLONASE) 50 MCG/ACT nasal spray Place 2 sprays into both nostrils daily. 16 g 11  . furosemide (LASIX) 40 MG tablet TAKE 1 & 1/2 (ONE & ONE-HALF) TABLETS BY MOUTH  DAILY 135 tablet 5  . glucose blood (ACCU-CHEK GUIDE) test strip Use to monitor glucose levels BID 100 each 12  . Insulin Glargine (BASAGLAR KWIKPEN) 100 UNIT/ML SOPN Inject 1.4 mLs (140 Units total) into the skin every morning. And pen  needles 1/day 20 pen prn  . Lancets 28G MISC Use for glucose testing up to BID 100 each 12  . Lancets Misc. (ACCU-CHEK FASTCLIX LANCET) KIT Use to monitor glucose levels BID 200 kit 1  . Potassium Chloride ER 20 MEQ TBCR Take 20 mEq by mouth daily. 30 tablet 3  . sacubitril-valsartan (ENTRESTO) 97-103 MG Take 1 tablet by mouth 2 (two) times daily. 60 tablet 11  . sulfamethoxazole-trimethoprim (BACTRIM DS,SEPTRA DS) 800-160 MG tablet TAKE 1 TABLET BY MOUTH TWICE DAILY FOR 2 WEEKS  1   No current facility-administered medications on file prior to visit.     Review of Systems:  As per HPI- otherwise negative.   Physical Examination: There were no vitals filed for this visit. There were no vitals filed for this visit. There is no height or weight on file to calculate BMI. Ideal Body Weight:    GEN: WDWN, NAD, Non-toxic, A & O x 3 HEENT: Atraumatic, Normocephalic. Neck supple. No masses, No LAD. Ears and Nose: No external deformity. CV: RRR, No M/G/R. No JVD. No thrill. No extra heart sounds. PULM: CTA B, no wheezes, crackles, rhonchi. No retractions. No resp. distress. No accessory muscle use. ABD: S, NT, ND, +BS. No rebound. No HSM. EXTR: No c/c/e NEURO Normal gait.  PSYCH: Normally interactive. Conversant. Not depressed or anxious appearing.  Calm demeanor.    Assessment and Plan: ***  Signed [name], MD

## 2018-06-18 NOTE — Telephone Encounter (Signed)
Patient needs an appointment per [place] in staff message. Will call to schedule ov.

## 2018-06-18 NOTE — Telephone Encounter (Signed)
Verbal orders given via detailed message.  

## 2018-06-18 NOTE — Telephone Encounter (Signed)
I am calling to schedule your surgery.  Can you do it on Monday, November 25 at [facility]?  "That date will be fine with me.  I believe my doctor had it confused.  She said that Dr. Logan Bores wanted to do my surgery in 30 days.  I told her that wasn't right because Dr. Logan Bores told me he wanted to do it right away.  So yes, Monday is fine.  What time will I need to be there?"  Someone from the surgical center will normally call you a day or two prior to your surgical date.  They will give you your arrival time.  You may have to be there as early as 5 am.  Hold on, the  [facility] surgery scheduler is telling me that date is not available, my request was denied.  I will see what other date I can find and give you a call back.  "Call me on my cell number.  I use this number most of the time, I very seldom use the home phone."  I am calling to see if we can do your surgery on November 26 at [facility].  "That is fine with me.  I'm open to any day.  You just don't know, people usually aren't eager for surgery but I'm ready.  What time do I need to be there?  "It will be around 5 am.  Someone from the surgical center will call and give you the exact arrival time."  Okay, thank you so much."  Again, your surgery will be at [facility], not [facility].  "I understand, I got you."

## 2018-06-18 NOTE — Telephone Encounter (Signed)
OV has been scheduled for this thurdsday at 3:00 for surgical clearance.

## 2018-06-20 ENCOUNTER — Telehealth: Payer: Self-pay | Admitting: *Deleted

## 2018-06-20 ENCOUNTER — Ambulatory Visit (INDEPENDENT_AMBULATORY_CARE_PROVIDER_SITE_OTHER): Payer: No Typology Code available for payment source | Admitting: Family Medicine

## 2018-06-20 ENCOUNTER — Encounter: Payer: Self-pay | Admitting: Family Medicine

## 2018-06-20 ENCOUNTER — Ambulatory Visit: Payer: No Typology Code available for payment source | Admitting: Family Medicine

## 2018-06-20 VITALS — BP 124/80 | HR 92 | Temp 98.1°F | Resp 16 | Ht 71.0 in | Wt 288.0 lb

## 2018-06-20 DIAGNOSIS — Z01818 Encounter for other preprocedural examination: Secondary | ICD-10-CM | POA: Diagnosis not present

## 2018-06-20 NOTE — Telephone Encounter (Signed)
Received Physician Orders from [facility]; forwarded to provider/SLS 11/21  

## 2018-06-20 NOTE — Patient Instructions (Addendum)
It was good to see you today- I hope that your operation goes very well!  Please let me know if I can do anything else to help You probably need to see your heart doctor prior to your operation- give Dr. Gala Romney a call

## 2018-06-20 NOTE — Progress Notes (Signed)
[place] at [facility] [address], [place], [place] [address] [phone]  Date:  06/20/2018   Name:  Mitchell [name]   DOB:  [birth year]   MRN:  [id number]  PCP:  Mitchell Mclean, MD    Chief Complaint: Surgical Clearance (foot surgery on tuesday)   History of Present Illness:  Mitchell [name] is a 47 y.o. very pleasant male patient who presents with the following:  This note to serve as history and physical for his upcoming operation  Here today to discuss a planned operation for his right foot-  His podiatrist Dr. Amalia [name] is working on a chronic foot ulcer for him They plan to remove a piece of infected bone on the right lateral foot in the OR This should be outpt procedure  He has a left sided PICC line in place for IVabx at this time  He is receiving IV Vancomycin and ceftriaxone His DM is under much better control with the help of Dr. Loanne [name] - A1c had been up to 13.9 so much improved as below  Lab Results  Component Value Date   HGBA1C 8.4 (A) 05/20/2018   Endocrinology- Dr Mitchell [name] Cardiology: Mitchell [name] ID: Mitchell [name]  Pt reports no CP or SOB No nausea or vomiting  He could use a urine microalbumin and lipid panel but he just had blood drawn and declines to do this today  He can't do a urine sample today  He declines a flu shot today   Patient Active Problem List   Diagnosis Date Noted  . Foot ulcer ([place]) 03/11/2018  . Weakness 05/08/2017  . Leukocytosis 05/08/2017  . Stroke ([place]) 05/08/2017  . Slurred speech 05/08/2017  . Noncompliance 01/22/2014  . Chronic systolic heart failure ([place]) 10/02/2011  . Erectile dysfunction 10/02/2011  . Obstructive sleep apnea 10/11/2007  . Diabetes mellitus with complication ([place]) 29/93/7169  . HLD (hyperlipidemia) 10/10/2007  . HYPOKALEMIA 10/10/2007  . OBESITY 10/10/2007  . Essential hypertension 10/10/2007  . PREMATURE VENTRICULAR CONTRACTIONS 10/10/2007  . SYNCOPE  10/10/2007  . COLONIC POLYPS, HX OF 10/10/2007    Past Medical History:  Diagnosis Date  . Bipolar disorder ([place])   . CHF (congestive heart failure) ([place])   . Chronic systolic heart failure ([place]) 11/2010   a. NICM 4/14 EF 20-25%, TR mild  . CVA (cerebral infarction)    No residual deficits  . Depression    PTSD,   . Diabetes mellitus    TYPE II; UNCONTROLLED BY HEMOGLOBIN A1c; STABLE AS  PER DISCHARGE  . Headache(784.0)   . 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 History of colonic polyps   . Hyperlipidemia   . Hypertension   . Myocardial infarction ([place]) 1987   (while playing football)  . Non-ischemic cardiomyopathy ([facility])    No ischemia on myoview, showed EF of 42%. 2D echo showed EF 67-89% with diatolic dysfunction in 3810  . Obesity   . OSA (obstructive sleep apnea)    Being set up again for c-pap  . Pneumonia   . Post-cardiac injury syndrome ([place])    History of cardiac injury from blunt trauma  . PVCs (premature ventricular contractions)   . Schizophrenia ([place])    Goes to [facility]  . SOB (shortness of breath)   . Stroke [facility]) 2005   some left side weakness  . Syncope    Recurrent, thought to be vasovagal. Also has h/o frequent PVCs.  Past Surgical History:  Procedure Laterality Date  . CARDIAC CATHETERIZATION  12/19/10   DIFFUSE NONOBSTRUCTIVE CAD; NONISCHEMIC CARDIOMYOPATHY; LEFT VENTRICULAR ANGIOGRAM WAS PERFORMED SECONDARY TO  ELEVATED LEFT VENTRICULAR FILLING PRESSURES  . COLONOSCOPY W/ POLYPECTOMY    . MULTIPLE EXTRACTIONS WITH ALVEOLOPLASTY  01/27/2014   "all my teeth; 4 Quadrants of alveoloplasty  . MULTIPLE EXTRACTIONS WITH ALVEOLOPLASTY N/A 01/27/2014   Procedure: EXTRACTION OF TEETH #'1, 2, 3, 4, 5, 6, 7, 8, 9, 10, 11, 12, 13, 14, 15, 16, 17, 20, 21, 22, 23, 24, 25, 26, 27, 28, 29, 31 and 32 WITH ALVEOLOPLASTY;  Surgeon: Lenn Cal, DDS;  Location: [facility] [place];  Service: Oral Surgery;  Laterality: N/A;  . ORIF FINGER / THUMB FRACTURE  Right   . RIGHT/LEFT HEART CATH AND CORONARY ANGIOGRAPHY N/A 09/26/2017   Procedure: RIGHT/LEFT HEART CATH AND CORONARY ANGIOGRAPHY;  Surgeon: Jolaine Artist, MD;  Location: [place] [facility];  Service: Cardiovascular;  Laterality: N/A;    Social History   Tobacco Use  . Smoking status: Never Smoker  . Smokeless tobacco: Never Used  Substance Use Topics  . Alcohol use: No  . Drug use: No    Family History  Problem Relation Age of Onset  . Heart disease Mother        MI  . Heart failure Mother   . Diabetes Mother        ALSO IN MOST OF HIS SIBLINGS; 2 UNLCES HAVE ALSO PASSED AWAY FROM DM  . Cardiomyopathy Mother   . Cancer - Ovarian Mother   . Heart disease Father   . Hypertension Father   . Diabetes Father   . Diabetes Sister   . Diabetes Brother     Allergies  Allergen Reactions  . Nsaids Anaphylaxis and Other (See Comments)    Able to tolerate aspirin     Medication list has been reviewed and updated.  Current Outpatient Medications on File Prior to Visit  Medication Sig Dispense Refill  . aspirin 81 MG EC tablet Take 1 tablet (81 mg total) by mouth daily. 90 tablet 3  . atorvastatin (LIPITOR) 40 MG tablet TAKE 2 TABLETS BY MOUTH AT BEDTIME 60 tablet 11  . Blood Glucose Monitoring Suppl (ACCU-CHEK GUIDE ME) w/Device KIT 1 each by Does not apply route 2 (two) times daily. Use to monitor glucose levels BID 1 kit 0  . carvedilol (COREG) 6.25 MG tablet Take 1 tablet (6.25 mg total) by mouth 2 (two) times daily with a meal. 180 tablet 3  . cetirizine (ZYRTEC ALLERGY) 10 MG tablet Take 1 tablet (10 mg total) by mouth daily. 30 tablet 11  . clopidogrel (PLAVIX) 75 MG tablet TAKE 1 TABLET BY MOUTH ONCE DAILY 30 tablet 11  . cyclobenzaprine (FLEXERIL) 10 MG tablet Take 1 tablet (10 mg total) by mouth 2 (two) times daily as needed for muscle spasms. (Patient not taking: Reported on 06/20/2018) 20 tablet 0  . digoxin (LANOXIN) 0.125 MG tablet TAKE 1 TABLET BY MOUTH ONCE  DAILY (Patient taking differently: Take 0.125 mg by mouth daily. ) 30 tablet 11  . empagliflozin (JARDIANCE) 10 MG TABS tablet Take 10 mg by mouth daily. 30 tablet 6  . fluticasone (FLONASE) 50 MCG/ACT nasal spray Place 2 sprays into both nostrils daily. 16 g 11  . furosemide (LASIX) 40 MG tablet TAKE 1 & 1/2 (ONE & ONE-HALF) TABLETS BY MOUTH  DAILY (Patient taking differently: Take 40 mg by mouth daily. ) 135 tablet 5  .  glucose blood (ACCU-CHEK GUIDE) test strip Use to monitor glucose levels BID 100 each 12  . Insulin Glargine (BASAGLAR KWIKPEN) 100 UNIT/ML SOPN Inject 1.4 mLs (140 Units total) into the skin every morning. And pen needles 1/day 20 pen prn  . Lancets 28G MISC Use for glucose testing up to BID 100 each 12  . Lancets Misc. (ACCU-CHEK FASTCLIX LANCET) KIT Use to monitor glucose levels BID 200 kit 1  . Potassium Chloride ER 20 MEQ TBCR Take 20 mEq by mouth daily. 30 tablet 3  . sacubitril-valsartan (ENTRESTO) 97-103 MG Take 1 tablet by mouth 2 (two) times daily. 60 tablet 11  . cefTRIAXone (ROCEPHIN) 2 g SOLR injection Inject 2 g into the vein daily.    . vancomycin 1,500 mg in sodium chloride 0.9 % 500 mL Inject 1,500 mg into the vein every 12 (twelve) hours.     No current facility-administered medications on file prior to visit.     Review of Systems:  As per HPI- otherwise negative. No fever or chills He reports feeling very well   Physical Examination: Vitals:   06/20/18 1043  BP: 124/80  Pulse: 92  Resp: 16  Temp: 98.1 F (36.7 C)  SpO2: 96%   Vitals:   06/20/18 1043  Weight: 288 lb (130.6 kg)  Height: 5' 11"  (1.803 m)   Body mass index is 40.17 kg/m. Ideal Body Weight: Weight in (lb) to have BMI = 25: 178.9   GEN: WDWN, NAD, Non-toxic, A & O x 3, obese, looks well  HEENT: Atraumatic, Normocephalic. Neck supple. No masses, No LAD.  Bilateral TM wnl, oropharynx normal.  PEERL,EOMI.   Ears and Nose: No external deformity. CV: RRR, No M/G/R. No JVD. No  thrill. No extra heart sounds. PULM: CTA B, no wheezes, crackles, rhonchi. No retractions. No resp. distress. No accessory muscle use. ABD: S, NT, ND EXTR: No c/c/e NEURO Normal gait.  PSYCH: Normally interactive. Conversant. Not depressed or anxious appearing.  Calm demeanor.  Wearing a walking boot on right foot  Foot wound is dressed carefully, did not remove dressing per pt request picc line in place left arm   Assessment and Plan: Pre-operative examination  Here today for a pre-operative exam/ H and P Will fax this note to the pre-op nurse as requested His diabetes is under better control Asked pt to call his cardiologist and arrange a visit prior to his operation as well  Pt declines a flu shot and blood work/ urine today  Signed [name], MD

## 2018-06-21 ENCOUNTER — Encounter: Payer: Self-pay | Admitting: Family Medicine

## 2018-06-23 NOTE — Progress Notes (Signed)
   Subjective:  47 year old male history of diabetes mellitus and an ulcer to the sub-fifth MTPJ right foot presents the office today for follow-up evaluation.  Patient states that the ulcer to the sub-fifth MTPJ is consistent with minimal improvement despite conservative modalities.  Patient was last seen here in the office 01/09/2018.  He has been treated at the wound care center and he presents today as referral from the wound care center for a second opinion and possible surgical consultation regarding the ulceration site.  He presents today for further treatment evaluation  Objective/Physical Exam General: The patient is alert and oriented x3 in no acute distress.  Dermatology:  Wound #1 noted to the 0.6 x 0.5 x 0.3 cm (LxWxD).   To the noted ulceration(s), there is no eschar. There is a moderate amount of slough, fibrin, and necrotic tissue noted. Granulation tissue and wound base is red. There is a minimal amount of serosanguineous drainage noted.  Today the does appear to be some exposed bone with probing of the ulceration site.  There is no malodor. Periwound integrity is intact. Skin is warm, dry and supple bilateral lower extremities.  Vascular: Palpable pedal pulses bilaterally. No edema or erythema noted. Capillary refill within normal limits.  Neurological: Epicritic and protective threshold diminished bilaterally.   Musculoskeletal Exam: Range of motion within normal limits to all pedal and ankle joints bilateral. Muscle strength 5/5 in all groups bilateral.   Assessment: #1  Ulcer sub-fifth MTPJ right foot secondary to diabetes mellitus #2 diabetes mellitus w/ peripheral polyneuropathy #3 exposed bone/osteomyelitis fifth metatarsal head right foot  Plan of Care:  #1 Patient was evaluated. #2 medically necessary excisional debridement including subcutaneous tissue was performed using a tissue nipper and a chisel blade. Excisional debridement of all the necrotic nonviable  tissue down to healthy bleeding viable tissue was performed with post-debridement measurements same as pre-. #3 the wound was cleansed and dry sterile dressing applied. #4  Today we discussed conservative versus surgical modalities regarding the ulceration site and exposed bone.  Its in the patient's best interest to proceed with surgical intervention fifth metatarsal head resection right foot.  All possible complications and details of the procedure were explained.  No guarantees were expressed or implied.  Patient opts for surgery. #5 authorization for surgery initiated today.  Surgery will consist of fifth metatarsal head resection right.  Excisional debridement right ulcer with application of ACell wound matrix #6 continue IV antibiotics x6 weeks as per infectious disease  Felecia Shelling, DPM [facility]  Dr. Felecia Shelling, DPM    [address]                                        [place], [place] [address]                Office [phone]  Fax [phone]

## 2018-06-24 ENCOUNTER — Other Ambulatory Visit: Payer: Self-pay

## 2018-06-24 ENCOUNTER — Telehealth (HOSPITAL_COMMUNITY): Payer: Self-pay | Admitting: Cardiology

## 2018-06-24 ENCOUNTER — Encounter (HOSPITAL_COMMUNITY): Payer: Self-pay | Admitting: Vascular Surgery

## 2018-06-24 ENCOUNTER — Telehealth: Payer: Self-pay | Admitting: *Deleted

## 2018-06-24 ENCOUNTER — Encounter (HOSPITAL_COMMUNITY): Payer: Self-pay

## 2018-06-24 NOTE — Telephone Encounter (Signed)
"  Did you get medical clearance from Cardiac?  Dr. Dallas Schimke suggested you get it from Cardiac on November 15."  I am not sure.  I will ask Dr. Logan Bores and give you a call back.  I called Mr. Mells and asked him if he has seen Dr. Gala Romney as directed by Dr. Patsy Lager.  He stated he did not know what I was talking about.  I explained to him that Dr. Patsy Lager gave clearance from her standpoint but she recommended we get cardiac clearance.  He asked why she didn't tell him this.  I informed him that it was in his instructions that she gave him.  He asked why she didn't tell him verbally.  I told him I can't answer that question.  He said we were giving him the run around.  He said he is seeing about four doctors for his foot.  He said he doesn't know which direction he's going in and that he's getting confused with all these appointments.  I calmed him down and asked him to call Dr. Prescott Gum office to see if he could get medical clearance.    I called Dr. Prescott Gum office.  I spoke to Chantel.  I explained to her that we needed medical clearance for Mr. Resendis and that his surgery is scheduled for tomorrow morning at 7:30 am.  She said most likely it will not be able to be addressed today since it was so late in the afternoon.  She said all the doctors were gone for the day but she would see if she could find someone to look at his chart and give me a call back.  Chantel returned my call.  She said, "We cannot provide that medical clearance.  His last echo was in 2018.  I spoke to a Nurse Practitioner, Nino Parsley and this is what she told me."  I called Ms. Chipper Herb back and informed her that we will have to cancel the surgery.  I informed her that they said Mr. Cucco will need another Echo performed.  She stated that his Caridiac Catheterization was performed in February.  She said Dr. Logan Bores can call Dr. Chaney Malling if he wanted to discuss alternatives.    Dr. Logan Bores called Dr. Chaney Malling.  He decided he  wanted the patient to get cardiac clearance as recommended by Dr. Patsy Lager.  I called the surgical center and canceled the surgery for tomorrow morning.  I attempted to call the patient to inform him that we are cancelling his appointment for tomorrow.  I informed him that he needs to call and schedule an appointment with Dr. Gala Romney.  I asked him to give me a call back tomorrow.

## 2018-06-24 NOTE — Progress Notes (Signed)
Anesthesia Chart Review:  Case:  [id number] Date/Time:  06/25/18 0715   Procedures:      SUB FIFTH  METATARSA; RIGHT FOOT (Right )     METATARSAL HEAD RECESSION FIFTH (Right )   Anesthesia type:  General   Pre-op diagnosis:  ULCERS   Location:  [facility] OR ROOM 04 / [place] [facility]   Surgeon:  Edrick Kins, DPM      DISCUSSION: Patient is a 47 year old male scheduled for the above procedure. By notes, he currently is receiving IV vancomycin via left PICC (placed 06/14/18) for foot infection. ID Dr. [name] wants Dr. Amalia Hailey to do a bone culture at the time of his surgery.   History includes never smoker, CAD (non-obstructive 2012; diffuse 3V CAD "without real options for PCI or surgical revascularization" 2/45/80), chronic systolic CHF, non-ischemic cardiomyopathy (diagnosed 11/2010; has refused AICD in the past, told EP 09/2017 he would call when ready to schedule), cardiac injury from blunt trauma (reported a history of MI at age 7 while playing football), OSA with CPAP non-compliance, HTN, DM2, recurrent syncope thought to be vasovagal (Bidil discontinued), PVCs, CVA (old right cerebellar infarct 04/2017 MRI), headaches, Bipolar disorder, Schizophrenia, HLD. Most recent BMI (40) documented is consistent with morbid obesity.  He denied SOB, chest pain, and fever per PAT phone interviewing RN.  Reported last ASA 06/21/18, Plavix 06/23/18.   PCP [name], [name], MD recommended preoperative cardiology input/evaluation. I have spoke with Delydia at Dr. Rebekah [place] office to see if this has happened yet. She will ask him to review and call back. Patient last saw Dr. [name] in 09/2017 and missed his June appointment. No good PCI or bypass options by cath 08/2017. EF was stable at 30-35% (up from  20-25% 04/2017). Patient has not consented to ICD. (Update: Dr. Amalia Hailey plans to postpone case until patient has cardiac clearance.)   VS: Ht 5' 11"  (1.803 m)   Wt 130.6 kg   BMI 40.17 kg/m   PROVIDERS: [name], [name],  MD is PCP. Last visit 06/14/18. She completed pre-operative H&P and recommended cardiology input as well. Glori Bickers, MD is cardiologist. Last visit 10/23/17. Patient did not show for his 01/02/18 visit.  Cristopher Peru, MD is EP cardiologist. Last visit 10/04/17. He discussed consideration of ICD. Patient said he would let Dr. Lovena Le know if he decides to proceed. Bobby Rumpf, MD is ID. Last visit 06/13/18.  Renato Shin, MD is endocrinologist. Last visit 05/20/18.    LABS: Patient had labs on 06/17/18 (per ID). Results included: Lab Results  Component Value Date   WBC 9.6 06/17/2018   HGB 12.8 (L) 06/17/2018   HCT 40.1 06/17/2018   PLT 159 06/17/2018   GLUCOSE 80 06/17/2018   ALT 16 06/17/2018   AST 21 06/17/2018   NA 140 06/17/2018   K 4.7 06/17/2018   CL 109 06/17/2018   CREATININE 0.90 06/17/2018   BUN 23 (H) 06/17/2018   CO2 26 06/17/2018   INR 1.01 09/19/2017   HGBA1C 8.4 (A) 05/20/2018    IMAGES: MRI/MRA head 05/06/18 (ED visit for headaches, diplopia, possible cluster headache): IMPRESSION: 1. No acute intracranial abnormality. 2. Severe stenosis of the left internal carotid artery cavernous segment and distal left middle cerebral artery M1 segment. 3. Old right cerebellar infarct. (Comparison MRI/MRI 05/08/17 showed old right inferior cerebellar infarct, moderate stenosis in the left carotid siphon, narrowing of M1 segments, possible severe on left, bilateral vertebral stenoses, left worse than right,  narrowing irregularity of the proximal right PCA, possible superior cerebellar artery stenoses. Seen by neurologist Rosalin Hawking 05/08/17, MRA findings felt likely due to long standing uncontrolled DM, HTN, HLD, OSA, obesity. Recommend DAPT with ASA and Plavix.)   EKG: 03/04/18: SR, probable left atrial enlargement. Left BBB.    CV: RHC/LHC 09/26/17 (Dr. [name]):  Dist RCA lesion is 70% stenosed.  Ost RPDA to RPDA lesion is 85% stenosed.  Ost 1st Mrg  lesion is 95% stenosed.  1st Mrg lesion is 95% stenosed.  Ost 2nd Mrg to 2nd Mrg lesion is 95% stenosed.  Prox LAD to Mid LAD lesion is 80% stenosed.  Dist LAD lesion is 95% stenosed. Findings: RA = 8 RV = 43/10 PA = 43/16 (26) PCW = 11 Fick cardiac output/index = 6.4/2.6 PVR = 2.3 WU Ao sat = 91% PA sat = 64%, 65% Assessment: 1. Diffuse 3v CAD with diabetic vasculopathy. No clear targets for intervention 2. Ischemic CM EF 30-35% 3. Well-compensated hemodynamics Plan/Discussion: Cath films reviewed with interventional team. Agree that there are no real options for PCI or surgical revascularization.    Echo 05/08/17: Study Conclusions - Left ventricle: The cavity size was severely dilated. There was   moderate concentric hypertrophy. Systolic function was severely   reduced. The estimated ejection fraction was in the range of 20%   to 25%. Wall motion was normal; there were no regional wall   motion abnormalities. Features are consistent with a pseudonormal   left ventricular filling pattern, with concomitant abnormal   relaxation and increased filling pressure (grade 2 diastolic   dysfunction). - Left atrium: The atrium was mildly dilated. - Pulmonic valve: There was trivial regurgitation. - Pericardium, extracardiac: A small, free-flowing pericardial   effusion was identified along the right ventricular free wall.   The fluid had no internal echoes. (Comparison EF 20-25%, diffuse hypokinesis 06/13/16; EF 20-25%, diffuse hypokinesis. Akinesis of the entire anteroseptal myocardium 11/24/13.)  Carotid U/S 05/08/17: Summary: - The vertebral arteries appear patent with antegrade flow. - Findings consistent with a 1-39 percent stenosis involving the right internal carotid artery and the left internal carotid artery.   Past Medical History:  Diagnosis Date  . Bipolar disorder ([place])   . CHF (congestive heart failure) ([place])   . Chronic systolic heart failure ([place])  11/2010   a. NICM 4/14 EF 20-25%, TR mild  . CVA (cerebral infarction)    No residual deficits  . Depression    PTSD,   . Diabetes mellitus    TYPE II; UNCONTROLLED BY HEMOGLOBIN A1c; STABLE AS  PER DISCHARGE  . Headache(784.0)   . 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 History of colonic polyps   . Hyperlipidemia   . Hypertension   . Myocardial infarction ([place]) 1987   (while playing football)  . Non-ischemic cardiomyopathy ([facility])    No ischemia on myoview, showed EF of 42%. 2D echo showed EF 31-54% with diatolic dysfunction in 0086  . Obesity   . OSA (obstructive sleep apnea)    Being set up again for c-pap. Pt sts he does not use a cpap, and the test was negative.  . Pneumonia   . Post-cardiac injury syndrome ([place])    History of cardiac injury from blunt trauma  . PVCs (premature ventricular contractions)   . Schizophrenia ([place])    Goes to [facility]  . SOB (shortness of breath)   . Stroke [facility]) 2005   some left side weakness  . Syncope  Recurrent, thought to be vasovagal. Also has h/o frequent PVCs.     Past Surgical History:  Procedure Laterality Date  . CARDIAC CATHETERIZATION  12/19/10   DIFFUSE NONOBSTRUCTIVE CAD; NONISCHEMIC CARDIOMYOPATHY; LEFT VENTRICULAR ANGIOGRAM WAS PERFORMED SECONDARY TO  ELEVATED LEFT VENTRICULAR FILLING PRESSURES  . COLONOSCOPY W/ POLYPECTOMY    . MULTIPLE EXTRACTIONS WITH ALVEOLOPLASTY  01/27/2014   "all my teeth; 4 Quadrants of alveoloplasty  . MULTIPLE EXTRACTIONS WITH ALVEOLOPLASTY N/A 01/27/2014   Procedure: EXTRACTION OF TEETH #'1, 2, 3, 4, 5, 6, 7, 8, 9, 10, 11, 12, 13, 14, 15, 16, 17, 20, 21, 22, 23, 24, 25, 26, 27, 28, 29, 31 and 32 WITH ALVEOLOPLASTY;  Surgeon: Lenn Cal, DDS;  Location: [place];  Service: Oral Surgery;  Laterality: N/A;  . ORIF FINGER / THUMB FRACTURE Right   . RIGHT/LEFT HEART CATH AND CORONARY ANGIOGRAPHY N/A 09/26/2017   Procedure: RIGHT/LEFT HEART CATH AND CORONARY ANGIOGRAPHY;  Surgeon:  Jolaine Artist, MD;  Location: [place] [facility];  Service: Cardiovascular;  Laterality: N/A;    MEDICATIONS: No current facility-administered medications for this encounter.    [name] Kitchen aspirin 81 MG EC tablet  . atorvastatin (LIPITOR) 40 MG tablet  . carvedilol (COREG) 6.25 MG tablet  . cefTRIAXone (ROCEPHIN) 2 g SOLR injection  . cetirizine (ZYRTEC ALLERGY) 10 MG tablet  . clopidogrel (PLAVIX) 75 MG tablet  . digoxin (LANOXIN) 0.125 MG tablet  . empagliflozin (JARDIANCE) 10 MG TABS tablet  . fluticasone (FLONASE) 50 MCG/ACT nasal spray  . furosemide (LASIX) 40 MG tablet  . Insulin Glargine (BASAGLAR KWIKPEN) 100 UNIT/ML SOPN  . Potassium Chloride ER 20 MEQ TBCR  . sacubitril-valsartan (ENTRESTO) 97-103 MG  . vancomycin 1,500 mg in sodium chloride 0.9 % 500 mL  . Blood Glucose Monitoring Suppl (ACCU-CHEK GUIDE ME) w/Device KIT  . cyclobenzaprine (FLEXERIL) 10 MG tablet  . glucose blood (ACCU-CHEK GUIDE) test strip  . Lancets 28G MISC  . Lancets Misc. (ACCU-CHEK FASTCLIX LANCET) KIT    [facility] Short Stay [facility]/Anesthesiology Phone [phone] 06/24/2018 5:06 PM

## 2018-06-24 NOTE — Progress Notes (Signed)
PCP - Dr. Warner Mccreedy  Cardiologist - Dr. Gala Romney  Chest x-ray - Denies  EKG - 03/04/18 (E)  Stress Test - Denies  ECHO - 05/08/17 (E)  Cardiac Cath - 09/26/17 (E)  AICD- na PM- na LOOP- na  Sleep Study - Yes- Negative +Stop Bang CPAP - None  LABS- 06/17/18: CBC w/D, CMP  ASA- LD- 11/22 Plavix- LD-11/24  HA1C- 05/20/18: 8.4 (E) Fasting Blood Sugar - 99-200 Checks Blood Sugar ___2__ times a day  Anesthesia- Yes- cardiac history  Pt denies having chest pain, sob, or fever during the pre-op phone call. All instructions explained to the pt, including:   How to Manage Your Diabetes Before and After Surgery  How do I manage my blood sugar before surgery?  o Check your blood sugar the morning of your surgery when you wake up and every 2 hours until you get to the Short Stay unit. . If your blood sugar is less than 70 mg/dL, you will need to treat for low blood sugar: o Do not take insulin. o Treat a low blood sugar (less than 70 mg/dL) with  cup of clear juice (cranberry or apple), 4 glucose tablets, OR glucose gel. Recheck blood sugar in 15 minutes after treatment (to make sure it is greater than 70 mg/dL). If your blood sugar is not greater than 70 mg/dL on recheck, call [phone] o  for further instructions. .  If your CBG is greater than 220 mg/dL, inform the staff upon arrival to Short Stay.  WHAT DO I DO ABOUT MY DIABETES MEDICATION?  [name] Kitchen Do not take Empagliflozin (JARDIANCE) the morning of surgery.  . THE MORNING OF SURGERY, take ____70_________ units of ____Lantus______insulin.  . Pt offered the main number to call for clarification. The opportunity to ask questions was provided.

## 2018-06-24 NOTE — Telephone Encounter (Signed)
"  I'm calling about Mitchell [name].  He'll be having surgery with Dr. Logan Bores tomorrow morning at 7:30 and there are no orders for his surgery.  Thank you."  I informed Dr. Logan Bores.

## 2018-06-24 NOTE — Telephone Encounter (Signed)
Lydia with [facility] called to request a cardiac clearance for patients scheduled surgey on 06/25/18 @ 730am. (metatarsal head recession and debrement)   Information given to Amy Clegg,NP Chart reviewed and unable to authorize  cardaic clearance at this time. Last [facility] 2018.   Information relayed to [facility]

## 2018-06-25 ENCOUNTER — Ambulatory Visit (HOSPITAL_COMMUNITY)
Admission: RE | Admit: 2018-06-25 | Payer: No Typology Code available for payment source | Source: Ambulatory Visit | Admitting: Podiatry

## 2018-06-25 ENCOUNTER — Telehealth: Payer: Self-pay

## 2018-06-25 SURGERY — DEBRIDEMENT, WOUND
Anesthesia: General | Laterality: Right

## 2018-06-25 NOTE — Telephone Encounter (Signed)
Copied from CRM [id number]. Topic: General - Other >> Jun 25, 2018  4:35 PM Wyonia Hough E wrote: Reason for CRM: Pt was scheduled to have foot surgery today(11.26.2019),  Pt stated that surgery was cancelled while he was there waiting due to Dr. Logan Bores scheduling surgery before getting clearance from the Cardiologist(Dr. Bensimhon) Pt needs to speak with Dr. Dallas Schimke or her Nurse ASAP/Please advise   Best contact for patient is [phone]

## 2018-06-25 NOTE — Telephone Encounter (Signed)
Post op appointments have been cancelled

## 2018-06-26 ENCOUNTER — Telehealth: Payer: Self-pay | Admitting: *Deleted

## 2018-06-26 NOTE — Telephone Encounter (Signed)
Called him back- his operation was canceled as he did not have cardiac clearance yet.  Apologized for the confusion. I was not asked to clear patient for surgery so I did not do so.  He has a cardiology appt scheduled but not until 1/9. Will message Dr. Gala Romney on his behalf to see if his appt can be moved up

## 2018-06-26 NOTE — Telephone Encounter (Signed)
Received Home Health Certification and Plan of Care; forwarded to provider/SLS 11/27  

## 2018-07-01 ENCOUNTER — Other Ambulatory Visit: Payer: No Typology Code available for payment source

## 2018-07-02 ENCOUNTER — Telehealth: Payer: Self-pay | Admitting: *Deleted

## 2018-07-02 ENCOUNTER — Encounter (HOSPITAL_BASED_OUTPATIENT_CLINIC_OR_DEPARTMENT_OTHER): Payer: No Typology Code available for payment source | Attending: Internal Medicine

## 2018-07-02 DIAGNOSIS — E11621 Type 2 diabetes mellitus with foot ulcer: Secondary | ICD-10-CM | POA: Insufficient documentation

## 2018-07-02 DIAGNOSIS — E1140 Type 2 diabetes mellitus with diabetic neuropathy, unspecified: Secondary | ICD-10-CM | POA: Diagnosis not present

## 2018-07-02 DIAGNOSIS — M86471 Chronic osteomyelitis with draining sinus, right ankle and foot: Secondary | ICD-10-CM | POA: Diagnosis not present

## 2018-07-02 DIAGNOSIS — L97412 Non-pressure chronic ulcer of right heel and midfoot with fat layer exposed: Secondary | ICD-10-CM | POA: Insufficient documentation

## 2018-07-02 DIAGNOSIS — E1169 Type 2 diabetes mellitus with other specified complication: Secondary | ICD-10-CM | POA: Insufficient documentation

## 2018-07-02 NOTE — Telephone Encounter (Signed)
I had sent a message to Dr. Teressa Lower and had the following exchange with him.   Andreas Blower.   As long as he is not getting general anesthesia, I think the risk is acceptable and he can proceed. If needs GA then I probably should talk to him   - dan   Previous Messages    ----- Message -----  From: Pearline Cables, MD  Sent: 06/26/2018  9:56 AM EST  To: Dolores Patty, MD   Hi Dan- I wanted to touch base about this mutual patient. He is supposed to have a bone resection operation per podiatry for I think osteomyelitis. I thought he should have cardiology clearance prior to his surgery. He was able to get an appt with you but not until January. Would he be able to see a PA or other provider sooner to get clearance?  If so, could you ask your staff to schedule him? He is eager to get his procedure done   Thank you and happy thanksgiving!      I have called his podiatrist and relayed I am not sure what type of anesthesia is planned  I think they plan to send a pre-op clearance form to cardiology and hopefully he can go ahead with his operation if a local is planned.   Otherwise he may need to be seen by cardiology before he has this operation

## 2018-07-02 NOTE — Telephone Encounter (Signed)
Received Physician Orders from [facility]; forwarded to provider/SLS 12/03

## 2018-07-02 NOTE — Telephone Encounter (Signed)
Dr. Shanda Bumps [name] states she completed a H & P for pt, but noted that pt needed to see his cardiologist. Dr. Patsy Lager tried to get pt and appt with Dr. Nicholes Mango, but Dr. Gala Romney states if pt is only having local anesthesia then he is okay, but if pt is to have general anesthesia he would need to be evaluated.

## 2018-07-04 ENCOUNTER — Telehealth: Payer: Self-pay | Admitting: Family Medicine

## 2018-07-04 NOTE — Telephone Encounter (Signed)
Copied from CRM [id number]. Topic: General - Other >> Jul 04, 2018  9:42 AM Gerrianne Scale wrote: Reason for CRM: [facility] [phone]  calling for orders to continue care until pt finis his antibiotics until 07-27-18

## 2018-07-04 NOTE — Telephone Encounter (Signed)
Called and gave VO- ok with me  

## 2018-07-08 ENCOUNTER — Encounter: Payer: No Typology Code available for payment source | Admitting: Podiatry

## 2018-07-09 ENCOUNTER — Other Ambulatory Visit (HOSPITAL_COMMUNITY): Payer: Self-pay | Admitting: Internal Medicine

## 2018-07-12 DIAGNOSIS — E11621 Type 2 diabetes mellitus with foot ulcer: Secondary | ICD-10-CM | POA: Diagnosis not present

## 2018-07-15 ENCOUNTER — Encounter: Payer: Self-pay | Admitting: Infectious Diseases

## 2018-07-16 DIAGNOSIS — E11621 Type 2 diabetes mellitus with foot ulcer: Secondary | ICD-10-CM | POA: Diagnosis not present

## 2018-07-18 ENCOUNTER — Ambulatory Visit (INDEPENDENT_AMBULATORY_CARE_PROVIDER_SITE_OTHER): Payer: No Typology Code available for payment source | Admitting: Infectious Diseases

## 2018-07-18 ENCOUNTER — Encounter: Payer: Self-pay | Admitting: Infectious Diseases

## 2018-07-18 DIAGNOSIS — L97512 Non-pressure chronic ulcer of other part of right foot with fat layer exposed: Secondary | ICD-10-CM | POA: Diagnosis not present

## 2018-07-18 DIAGNOSIS — E118 Type 2 diabetes mellitus with unspecified complications: Secondary | ICD-10-CM | POA: Diagnosis not present

## 2018-07-18 MED ORDER — AMOXICILLIN-POT CLAVULANATE 500-125 MG PO TABS: 1.0000 | ORAL_TABLET | Freq: Three times a day (TID) | ORAL | 0 refills | Status: DC

## 2018-07-18 NOTE — Progress Notes (Signed)
   Subjective:    Patient ID: Mitchell [name], male    DOB: 06-15-1971, 47 y.o.   MRN: [id number]  HPI 47 yo M with hx of DM2 requiring insulin (since 1981), complicated by CVA (2019), CHF, and mental illness (schizophrenia or bipolar unclear). He was seen in ID 06-13-18 for MRI showing osteo of 5th metatarsal head, and was started on vanco/ceftriaxone via PIC.  His foot has been healing, has podaitry f/u 07-2018. Has CV f/u 08-08-2018. Has been seen by [facility] as well.  No d/c from his wound.  No problems with PIC line. End date of anbx 07-25-18.   FSG 135 this AM.   Review of Systems  Constitutional: Negative for chills and fever.  Gastrointestinal: Negative for constipation and diarrhea.  Genitourinary: Negative for difficulty urinating.  Skin: Positive for wound.  Please see HPI. All other systems reviewed and negative.      Objective:   Physical Exam Constitutional:      Appearance: Normal appearance.  HENT:     Head: Normocephalic.  Eyes:     Extraocular Movements: Extraocular movements intact.  Neck:     Musculoskeletal: Normal range of motion and neck supple.  Cardiovascular:     Rate and Rhythm: Normal rate and regular rhythm.  Pulmonary:     Effort: Pulmonary effort is normal.     Breath sounds: Normal breath sounds.  Abdominal:     General: Bowel sounds are normal.     Palpations: Abdomen is soft.     Tenderness: There is no abdominal tenderness.  Neurological:     General: No focal deficit present.     Mental Status: He is alert and oriented to person, place, and time.  Psychiatric:        Mood and Affect: Mood normal.     His wound is pink, well healing, no d/c. No odor.  He has thickened callus around the wound.   He has LUE PIC that is clean. No erythema, no d/c.      Assessment & Plan:

## 2018-07-18 NOTE — Assessment & Plan Note (Signed)
Appears to be doing well Appreciate PCP f/u.  

## 2018-07-18 NOTE — Assessment & Plan Note (Signed)
Appears to be doing well.  ESR 37 and CRP 1 (07-15-18). Will change him to augmentin at completion of IV anbx Will see him back in 1-2 months.

## 2018-07-22 ENCOUNTER — Emergency Department (HOSPITAL_COMMUNITY)
Admission: EM | Admit: 2018-07-22 | Discharge: 2018-07-22 | Disposition: A | Discharge status: Home / Self Care | Payer: No Typology Code available for payment source | Attending: Emergency Medicine | Admitting: Emergency Medicine

## 2018-07-22 ENCOUNTER — Encounter (HOSPITAL_COMMUNITY): Payer: Self-pay | Admitting: *Deleted

## 2018-07-22 DIAGNOSIS — I5022 Chronic systolic (congestive) heart failure: Secondary | ICD-10-CM | POA: Insufficient documentation

## 2018-07-22 DIAGNOSIS — I252 Old myocardial infarction: Secondary | ICD-10-CM | POA: Insufficient documentation

## 2018-07-22 DIAGNOSIS — Y712 Prosthetic and other implants, materials and accessory cardiovascular devices associated with adverse incidents: Secondary | ICD-10-CM | POA: Diagnosis not present

## 2018-07-22 DIAGNOSIS — E119 Type 2 diabetes mellitus without complications: Secondary | ICD-10-CM | POA: Insufficient documentation

## 2018-07-22 DIAGNOSIS — Z8673 Personal history of transient ischemic attack (TIA), and cerebral infarction without residual deficits: Secondary | ICD-10-CM | POA: Diagnosis not present

## 2018-07-22 DIAGNOSIS — Z79899 Other long term (current) drug therapy: Secondary | ICD-10-CM | POA: Insufficient documentation

## 2018-07-22 DIAGNOSIS — Z794 Long term (current) use of insulin: Secondary | ICD-10-CM | POA: Insufficient documentation

## 2018-07-22 DIAGNOSIS — F250 Schizoaffective disorder, bipolar type: Secondary | ICD-10-CM | POA: Diagnosis not present

## 2018-07-22 DIAGNOSIS — I10 Essential (primary) hypertension: Secondary | ICD-10-CM | POA: Insufficient documentation

## 2018-07-22 DIAGNOSIS — T82838A Hemorrhage of vascular prosthetic devices, implants and grafts, initial encounter: Secondary | ICD-10-CM | POA: Insufficient documentation

## 2018-07-22 NOTE — ED Notes (Signed)
Per IV team, PICC line readjusted and bleeding stopped/controlled. EDP notified.

## 2018-07-22 NOTE — ED Triage Notes (Signed)
Per- EMS- pt has a left picc line that was scheduled to be taken out on Thursday. Pt was removing his jacket this morning and it pulled. Pt was on vancomycin for a foot ulcer that he is scheduled to have surgery for.

## 2018-07-22 NOTE — ED Notes (Signed)
Patient verbalizes understanding of discharge instructions. Opportunity for questioning and answers were provided. Armband removed by staff, pt discharged from ED. Pt ambulatory to lobby and taken home by family.  

## 2018-07-22 NOTE — ED Provider Notes (Signed)
[facility] EMERGENCY DEPARTMENT Provider Note   CSN: [id number] Arrival date & time: 07/22/18  1126     History   Chief Complaint Chief Complaint  Patient presents with  . Vascular Access Problem    HPI Mitchell [name] is a 47 y.o. male.  HPI  47 yo M with PMHx as below here w/ accidental bleeding from PICC line. Pt states that earlier today, he was infusing Vancomycin via his PICC line when he realized the secondary attachment had come off. His blood has backed up and was dripping from the attachment. He did not pull or twist the PICC line. He closed the port attachment and the bleeding stopped, but he was told to come here for a new attachment. Denies any pain, redness, fever, chills, or other signs of infection. No other complaints. He is on Vancomycin for his foot and states his foot is doing well with no new pain or complaints.  Past Medical History:  Diagnosis Date  . Bipolar disorder ([place])   . CHF (congestive heart failure) ([place])   . Chronic systolic heart failure ([place]) 11/2010   a. NICM 4/14 EF 20-25%, TR mild  . CVA (cerebral infarction)    No residual deficits  . Depression    PTSD,   . Diabetes mellitus    TYPE II; UNCONTROLLED BY HEMOGLOBIN A1c; STABLE AS  PER DISCHARGE  . Headache(784.0)   . 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 History of colonic polyps   . Hyperlipidemia   . Hypertension   . Myocardial infarction ([place]) 1987   (while playing football)  . Non-ischemic cardiomyopathy (HCC)    No ischemia on myoview, showed EF of 42%. 2D echo showed EF 93-73% with diatolic dysfunction in 4287  . Obesity   . OSA (obstructive sleep apnea)    Being set up again for c-pap. Pt sts he does not use a cpap, and the test was negative.  . Pneumonia   . Post-cardiac injury syndrome ([place])    History of cardiac injury from blunt trauma  . PVCs (premature ventricular contractions)   . Schizophrenia ([place])    Goes to [facility]  .  SOB (shortness of breath)   . Stroke [facility]) 2005   some left side weakness  . Syncope    Recurrent, thought to be vasovagal. Also has h/o frequent PVCs.     Patient Active Problem List   Diagnosis Date Noted  . Foot ulcer ([place]) 03/11/2018  . Weakness 05/08/2017  . Leukocytosis 05/08/2017  . Stroke ([place]) 05/08/2017  . Slurred speech 05/08/2017  . Noncompliance 01/22/2014  . Chronic systolic heart failure ([place]) 10/02/2011  . Erectile dysfunction 10/02/2011  . Obstructive sleep apnea 10/11/2007  . Diabetes mellitus with complication ([place]) 68/05/5725  . HLD (hyperlipidemia) 10/10/2007  . HYPOKALEMIA 10/10/2007  . OBESITY 10/10/2007  . Essential hypertension 10/10/2007  . PREMATURE VENTRICULAR CONTRACTIONS 10/10/2007  . SYNCOPE 10/10/2007  . COLONIC POLYPS, HX OF 10/10/2007    Past Surgical History:  Procedure Laterality Date  . CARDIAC CATHETERIZATION  12/19/10   DIFFUSE NONOBSTRUCTIVE CAD; NONISCHEMIC CARDIOMYOPATHY; LEFT VENTRICULAR ANGIOGRAM WAS PERFORMED SECONDARY TO  ELEVATED LEFT VENTRICULAR FILLING PRESSURES  . COLONOSCOPY W/ POLYPECTOMY    . MULTIPLE EXTRACTIONS WITH ALVEOLOPLASTY  01/27/2014   "all my teeth; 4 Quadrants of alveoloplasty  . MULTIPLE EXTRACTIONS WITH ALVEOLOPLASTY N/A 01/27/2014   Procedure: EXTRACTION OF TEETH #'1, 2, 3, 4, 5, 6, 7, 8, 9, 10, 11, 12, 13,  14, 15, 16, 17, 20, 21, 22, 23, 24, 25, 26, 27, 28, 29, 31 and 32 WITH ALVEOLOPLASTY;  Surgeon: Lenn Cal, DDS;  Location: [facility];  Service: Oral Surgery;  Laterality: N/A;  . ORIF FINGER / THUMB FRACTURE Right   . RIGHT/LEFT HEART CATH AND CORONARY ANGIOGRAPHY N/A 09/26/2017   Procedure: RIGHT/LEFT HEART CATH AND CORONARY ANGIOGRAPHY;  Surgeon: Jolaine Artist, MD;  Location: [facility];  Service: Cardiovascular;  Laterality: N/A;        Home Medications    Prior to Admission medications   Medication Sig Start Date End Date Taking? Authorizing Provider  amoxicillin-clavulanate  (AUGMENTIN) 500-125 MG tablet Take 1 tablet (500 mg total) by mouth 3 (three) times daily. 07/25/18   Campbell Riches, MD  aspirin 81 MG EC tablet Take 1 tablet (81 mg total) by mouth daily. 05/23/17   [name], [name], MD  atorvastatin (LIPITOR) 40 MG tablet TAKE 2 TABLETS BY MOUTH AT BEDTIME 05/29/18   [name], [name], MD  Blood Glucose Monitoring Suppl (ACCU-CHEK GUIDE ME) w/Device KIT 1 each by Does not apply route 2 (two) times daily. Use to monitor glucose levels BID 06/07/18   Renato Shin, MD  carvedilol (COREG) 6.25 MG tablet Take 1 tablet (6.25 mg total) by mouth 2 (two) times daily with a meal. 04/29/18   [name], [name], MD  cefTRIAXone (ROCEPHIN) 2 g SOLR injection Inject 2 g into the vein daily.    [provider]  cetirizine (ZYRTEC ALLERGY) 10 MG tablet Take 1 tablet (10 mg total) by mouth daily. 09/13/17   [name], [name], MD  clopidogrel (PLAVIX) 75 MG tablet TAKE 1 TABLET BY MOUTH ONCE DAILY 05/29/18   [name], [name], MD  cyclobenzaprine (FLEXERIL) 10 MG tablet Take 1 tablet (10 mg total) by mouth 2 (two) times daily as needed for muscle spasms. 05/15/18   Colon Branch, MD  digoxin (LANOXIN) 0.125 MG tablet TAKE 1 TABLET BY MOUTH ONCE DAILY Patient taking differently: Take 0.125 mg by mouth daily.  10/24/17   [name], [name], MD  empagliflozin (JARDIANCE) 10 MG TABS tablet Take 10 mg by mouth daily. 10/23/17   Bensimhon, Shaune Pascal, MD  fluticasone (FLONASE) 50 MCG/ACT nasal spray Place 2 sprays into both nostrils daily. 09/13/17   [name], [name], MD  furosemide (LASIX) 40 MG tablet TAKE 1 & 1/2 (ONE & ONE-HALF) TABLETS BY MOUTH  DAILY Patient taking differently: Take 40 mg by mouth daily.  05/29/18   [name], [name], MD  glucose blood (ACCU-CHEK GUIDE) test strip Use to monitor glucose levels BID 06/07/18   Renato Shin, MD  Insulin Glargine (BASAGLAR KWIKPEN) 100 UNIT/ML SOPN Inject 1.4 mLs (140 Units total) into the skin every morning. And pen  needles 1/day 05/20/18   Renato Shin, MD  Lancets 28G MISC Use for glucose testing up to BID 11/02/16   [name], [name], MD  Lancets Misc. (ACCU-CHEK FASTCLIX LANCET) KIT Use to monitor glucose levels BID 06/07/18   Renato Shin, MD  Potassium Chloride ER 20 MEQ TBCR Take 20 mEq by mouth daily. 10/02/16   [name], [name], MD  Potassium Chloride ER 20 MEQ TBCR TAKE 1 TABLET BY MOUTH ONCE DAILY 07/10/18   Bensimhon, Shaune Pascal, MD  sacubitril-valsartan (ENTRESTO) 97-103 MG Take 1 tablet by mouth 2 (two) times daily. 09/19/17   Shirley Friar, PA-C  vancomycin 1,500 mg in sodium chloride 0.9 % 500 mL Inject 1,500 mg into  the vein every 12 (twelve) hours.    [provider]    Family History Family History  Problem Relation Age of Onset  . Heart disease Mother        MI  . Heart failure Mother   . Diabetes Mother        ALSO IN MOST OF HIS SIBLINGS; 2 UNLCES HAVE ALSO PASSED AWAY FROM DM  . Cardiomyopathy Mother   . Cancer - Ovarian Mother   . Heart disease Father   . Hypertension Father   . Diabetes Father   . Diabetes Sister   . Diabetes Brother     Social History Social History   Tobacco Use  . Smoking status: Never Smoker  . Smokeless tobacco: Never Used  Substance Use Topics  . Alcohol use: No  . Drug use: No     Allergies   Nsaids   Review of Systems Review of Systems  Constitutional: Negative for chills and fever.  Respiratory: Negative for shortness of breath.   Cardiovascular: Negative for chest pain.  Musculoskeletal: Negative for neck pain.  Skin: Negative for rash and wound.  Allergic/Immunologic: Negative for immunocompromised state.  Neurological: Negative for weakness and numbness.  Hematological: Does not bruise/bleed easily.     Physical Exam Updated Vital Signs BP 134/78   Pulse 87   Temp 98.4 F (36.9 C) (Oral)   Resp 16   SpO2 99%   Physical Exam Vitals signs and nursing note reviewed.  Constitutional:      General:  He is not in acute distress.    Appearance: He is well-developed.  HENT:     Head: Normocephalic and atraumatic.  Eyes:     Conjunctiva/sclera: Conjunctivae normal.  Neck:     Musculoskeletal: Neck supple.  Cardiovascular:     Rate and Rhythm: Normal rate and regular rhythm.     Heart sounds: Normal heart sounds.  Pulmonary:     Effort: Pulmonary effort is normal. No respiratory distress.     Breath sounds: No wheezing.  Abdominal:     General: There is no distension.  Skin:    General: Skin is warm.     Capillary Refill: Capillary refill takes less than 2 seconds.     Findings: No rash.  Neurological:     Mental Status: He is alert and oriented to person, place, and time.     Motor: No abnormal muscle tone.     UPPER EXTREMITY EXAM: LEFT  INSPECTION & PALPATION: PICC line in place along medial aspect of upper arm, with dressing in place. The secondary attachment is filled with dark red blood and the distal connector is closed. No active bleeding. No surrounding erythema or induration. No TTP.  SENSORY: Sensation is intact to light touch in:  Superficial radial nerve distribution (dorsal first web space) Median nerve distribution (tip of index finger)   Ulnar nerve distribution (tip of small finger)     MOTOR:  + Motor posterior interosseous nerve (thumb IP extension) + Anterior interosseous nerve (thumb IP flexion, index finger DIP flexion) + Radial nerve (wrist extension) + Median nerve (palpable firing thenar mass) + Ulnar nerve (palpable firing of first dorsal interosseous muscle)  VASCULAR: 2+ radial pulse Brisk capillary refill < 2 sec, fingers warm and well-perfused   ED Treatments / Results  Labs (all labs ordered are listed, but only abnormal results are displayed) Labs Reviewed - No data to display  EKG None  Radiology No results found.  Procedures Procedures (  including critical care time)  Medications Ordered in ED Medications - No data to  display   Initial Impression / Assessment and Plan / ED Course  I have reviewed the triage vital signs and the nursing notes.  Pertinent labs & imaging results that were available during my care of the patient were reviewed by me and considered in my medical decision making (see chart for details).     47 yo M here with bleeding from PICC line. Pt accidentally dislodged secondary tubing while infusing Vanc. He seems to have received the full dose and the PICC site is c/d/i with no signs of trauma. No skin breakdown. The wound of his foot appears to be responding well. PICC team consulted and has re-dressed PICC and provided with new secondary attachment. Safe/stable for d/c home.  Final Clinical Impressions(s) / ED Diagnoses   Final diagnoses:  Bleeding from peripherally inserted central venous catheter (PICC), initial encounter [facility])    ED Discharge Orders    None       Duffy Bruce, MD 07/22/18 1925

## 2018-07-25 ENCOUNTER — Telehealth: Payer: Self-pay | Admitting: *Deleted

## 2018-07-25 NOTE — Telephone Encounter (Signed)
RN confirmed orders to pull PICC per Dr Ninetta Lights on 12/26. Patient needs to be at the airport for travel at 2pm.  [facility] pharmacy and nursing aware. Andree Coss, RN

## 2018-07-29 ENCOUNTER — Telehealth: Payer: Self-pay | Admitting: *Deleted

## 2018-07-29 NOTE — Telephone Encounter (Signed)
Received Home Health Discharge for review from [facility]; forwarded to provider/SLS 12/30  

## 2018-08-05 ENCOUNTER — Encounter (HOSPITAL_BASED_OUTPATIENT_CLINIC_OR_DEPARTMENT_OTHER): Payer: No Typology Code available for payment source | Attending: Internal Medicine

## 2018-08-05 DIAGNOSIS — E1142 Type 2 diabetes mellitus with diabetic polyneuropathy: Secondary | ICD-10-CM | POA: Diagnosis not present

## 2018-08-05 DIAGNOSIS — I252 Old myocardial infarction: Secondary | ICD-10-CM | POA: Diagnosis not present

## 2018-08-05 DIAGNOSIS — L84 Corns and callosities: Secondary | ICD-10-CM | POA: Diagnosis not present

## 2018-08-05 DIAGNOSIS — G4730 Sleep apnea, unspecified: Secondary | ICD-10-CM | POA: Diagnosis not present

## 2018-08-05 DIAGNOSIS — E11621 Type 2 diabetes mellitus with foot ulcer: Secondary | ICD-10-CM | POA: Diagnosis not present

## 2018-08-05 DIAGNOSIS — L97514 Non-pressure chronic ulcer of other part of right foot with necrosis of bone: Secondary | ICD-10-CM | POA: Insufficient documentation

## 2018-08-05 DIAGNOSIS — I110 Hypertensive heart disease with heart failure: Secondary | ICD-10-CM | POA: Insufficient documentation

## 2018-08-05 DIAGNOSIS — I5022 Chronic systolic (congestive) heart failure: Secondary | ICD-10-CM | POA: Insufficient documentation

## 2018-08-05 DIAGNOSIS — M86471 Chronic osteomyelitis with draining sinus, right ankle and foot: Secondary | ICD-10-CM | POA: Diagnosis not present

## 2018-08-05 DIAGNOSIS — E1169 Type 2 diabetes mellitus with other specified complication: Secondary | ICD-10-CM | POA: Insufficient documentation

## 2018-08-08 ENCOUNTER — Encounter (HOSPITAL_COMMUNITY): Payer: Self-pay | Admitting: Internal Medicine

## 2018-08-08 ENCOUNTER — Ambulatory Visit (HOSPITAL_COMMUNITY)
Admission: RE | Admit: 2018-08-08 | Discharge: 2018-08-08 | Disposition: A | Discharge status: Home / Self Care | Payer: No Typology Code available for payment source | Source: Ambulatory Visit | Attending: Internal Medicine | Admitting: Internal Medicine

## 2018-08-08 ENCOUNTER — Other Ambulatory Visit (HOSPITAL_COMMUNITY): Payer: No Typology Code available for payment source

## 2018-08-08 ENCOUNTER — Ambulatory Visit (HOSPITAL_BASED_OUTPATIENT_CLINIC_OR_DEPARTMENT_OTHER)
Admission: RE | Admit: 2018-08-08 | Discharge: 2018-08-08 | Disposition: A | Discharge status: Home / Self Care | Payer: No Typology Code available for payment source | Source: Ambulatory Visit

## 2018-08-08 ENCOUNTER — Telehealth (HOSPITAL_COMMUNITY): Payer: Self-pay

## 2018-08-08 VITALS — BP 140/84 | HR 93 | Wt 285.0 lb

## 2018-08-08 DIAGNOSIS — Z8673 Personal history of transient ischemic attack (TIA), and cerebral infarction without residual deficits: Secondary | ICD-10-CM | POA: Insufficient documentation

## 2018-08-08 DIAGNOSIS — Z8249 Family history of ischemic heart disease and other diseases of the circulatory system: Secondary | ICD-10-CM | POA: Diagnosis not present

## 2018-08-08 DIAGNOSIS — I5022 Chronic systolic (congestive) heart failure: Secondary | ICD-10-CM | POA: Diagnosis present

## 2018-08-08 DIAGNOSIS — E785 Hyperlipidemia, unspecified: Secondary | ICD-10-CM | POA: Insufficient documentation

## 2018-08-08 DIAGNOSIS — Z794 Long term (current) use of insulin: Secondary | ICD-10-CM | POA: Diagnosis not present

## 2018-08-08 DIAGNOSIS — F209 Schizophrenia, unspecified: Secondary | ICD-10-CM | POA: Diagnosis not present

## 2018-08-08 DIAGNOSIS — Z79899 Other long term (current) drug therapy: Secondary | ICD-10-CM | POA: Diagnosis not present

## 2018-08-08 DIAGNOSIS — Z01810 Encounter for preprocedural cardiovascular examination: Secondary | ICD-10-CM

## 2018-08-08 DIAGNOSIS — Z886 Allergy status to analgesic agent status: Secondary | ICD-10-CM | POA: Insufficient documentation

## 2018-08-08 DIAGNOSIS — Z7982 Long term (current) use of aspirin: Secondary | ICD-10-CM | POA: Insufficient documentation

## 2018-08-08 DIAGNOSIS — Z6839 Body mass index (BMI) 39.0-39.9, adult: Secondary | ICD-10-CM | POA: Insufficient documentation

## 2018-08-08 DIAGNOSIS — Z7902 Long term (current) use of antithrombotics/antiplatelets: Secondary | ICD-10-CM | POA: Diagnosis not present

## 2018-08-08 DIAGNOSIS — I110 Hypertensive heart disease with heart failure: Secondary | ICD-10-CM | POA: Insufficient documentation

## 2018-08-08 DIAGNOSIS — I252 Old myocardial infarction: Secondary | ICD-10-CM | POA: Diagnosis not present

## 2018-08-08 DIAGNOSIS — I2510 Atherosclerotic heart disease of native coronary artery without angina pectoris: Secondary | ICD-10-CM | POA: Diagnosis not present

## 2018-08-08 DIAGNOSIS — G4733 Obstructive sleep apnea (adult) (pediatric): Secondary | ICD-10-CM | POA: Insufficient documentation

## 2018-08-08 DIAGNOSIS — I10 Essential (primary) hypertension: Secondary | ICD-10-CM | POA: Diagnosis not present

## 2018-08-08 DIAGNOSIS — I428 Other cardiomyopathies: Secondary | ICD-10-CM | POA: Insufficient documentation

## 2018-08-08 DIAGNOSIS — E119 Type 2 diabetes mellitus without complications: Secondary | ICD-10-CM | POA: Diagnosis not present

## 2018-08-08 LAB — BASIC METABOLIC PANEL
Anion gap: 7 (ref 5–15)
BUN: 15 mg/dL (ref 6–20)
CHLORIDE: 108 mmol/L (ref 98–111)
CO2: 29 mmol/L (ref 22–32)
Calcium: 8.7 mg/dL — ABNORMAL LOW (ref 8.9–10.3)
Creatinine, Ser: 1.11 mg/dL (ref 0.61–1.24)
GFR calc Af Amer: >60 mL/min (ref >60)
GLUCOSE: 146 mg/dL — AB (ref 70–99)
POTASSIUM: 4.3 mmol/L (ref 3.5–5.1)
Sodium: 144 mmol/L (ref 135–145)

## 2018-08-08 MED ORDER — PERFLUTREN LIPID MICROSPHERE
1.0000 mL | INTRAVENOUS | Status: DC | PRN
  Administered 2018-08-08: 2 mL via INTRAVENOUS

## 2018-08-08 MED ORDER — CARVEDILOL 6.25 MG PO TABS: 9.3750 mg | ORAL_TABLET | Freq: Two times a day (BID) | ORAL | 3 refills | Status: DC

## 2018-08-08 NOTE — Telephone Encounter (Signed)
Surgical Clearance faxed to Dr. Logan Bores office at [facility] at [phone]

## 2018-08-08 NOTE — Progress Notes (Signed)
  Echocardiogram 2D Echocardiogram has been performed.  Sheralyn Boatman R 08/08/2018, 10:00 AM

## 2018-08-08 NOTE — Progress Notes (Signed)
[facility] CLINIC NOTE  Patient ID: Mitchell [name], male   DOB: May 03, 1971, 48 y.o.   MRN: [id number]  PCP: None  Mental Health: [facility]  Pulmonologist: dr Gwenette Greet  HPI:  Mitchell [name] is a 48 y.o. male with multiple medical problems including obesity, uncontrolled hypertension, uncontrolled diabetes, obstructive sleep apnea, bipolar disease/schizophrenia, CVA, nonobstructive coronary disease and systolic congestive heart failure secondary to mixed ischemic/nonischemic cardiomyopathy.He is S/P multiple tooth extractions 01/27/14.   Admitted to [facility] 6/10 through 01/09/14 with uncontrolled diabetes. Started back on insulin. He was also referred to Dr Enrique Sack for teeth extractions. Bidil stopped due to suspected syncope. Discharge weight 276 pounds.   Had [facility] on 09/26/17 with 3v disease and preserved CO/CI. No revascularization options. Saw EP about ICD. He has been given some dates to choose from and is working to get his blood sugar under control. He did not f/u with EP.   Returns for f/u and surgical clearance. Says he had infection in the bone in his right foot. Pending partial resection of right 5th toe. Gets around [facility]. No CP or SOB. Says he can walk 2 miles per day without problems. No orthopnea or PND.   ECHO today EF 25-30%  Echo 2/13:  EF 15-20% with mild to moderate mitral valve regurgitation ECHO 10/30/12 EF 20-25%  ECHO 11/22/13 EF 20-25%  ECHO 05/2016: EF 20-25%.  ECHO 04/2017: EF 20-25%, grade 2 DD  RHC 09/26/17:  Findings: RA = 8 RV = 43/10 PA = 43/16 (26) PCW = 11 Fick cardiac output/index = 6.4/2.6 PVR = 2.3 WU Ao sat = 91% PA sat = 64%, 65% Assessment: 1. Diffuse 3v CAD with diabetic vasculopathy. No clear targets for intervention 2. Ischemic CM EF 30-35% 3. Well-compensated hemodynamics  SH: Lives at home with wife and children. Does not smoke or drink alcohol. FH: Mom had heart diease  Review of systems complete and found to be negative unless listed in HPI.      Past Medical History:  Diagnosis Date  . Bipolar disorder ([place])   . CHF (congestive heart failure) ([place])   . Chronic systolic heart failure ([place]) 11/2010   a. NICM 4/14 EF 20-25%, TR mild  . CVA (cerebral infarction)    No residual deficits  . Depression    PTSD,   . Diabetes mellitus    TYPE II; UNCONTROLLED BY HEMOGLOBIN A1c; STABLE AS  PER DISCHARGE  . Headache(784.0)   . 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 History of colonic polyps   . Hyperlipidemia   . Hypertension   . Myocardial infarction ([place]) 1987   (while playing football)  . Non-ischemic cardiomyopathy (HCC)    No ischemia on myoview, showed EF of 42%. 2D echo showed EF 50-27% with diatolic dysfunction in 7412  . Obesity   . OSA (obstructive sleep apnea)    Being set up again for c-pap. Pt sts he does not use a cpap, and the test was negative.  . Pneumonia   . Post-cardiac injury syndrome ([place])    History of cardiac injury from blunt trauma  . PVCs (premature ventricular contractions)   . Schizophrenia ([place])    Goes to [facility]  . SOB (shortness of breath)   . Stroke [facility]) 2005   some left side weakness  . Syncope    Recurrent, thought to be vasovagal. Also has h/o frequent PVCs.     Current Outpatient Medications  Medication Sig Dispense Refill  . amoxicillin-clavulanate (  AUGMENTIN) 500-125 MG tablet Take 1 tablet (500 mg total) by mouth 3 (three) times daily. 90 tablet 0  . aspirin 81 MG EC tablet Take 1 tablet (81 mg total) by mouth daily. 90 tablet 3  . atorvastatin (LIPITOR) 40 MG tablet TAKE 2 TABLETS BY MOUTH AT BEDTIME 60 tablet 11  . Blood Glucose Monitoring Suppl (ACCU-CHEK GUIDE ME) w/Device KIT 1 each by Does not apply route 2 (two) times daily. Use to monitor glucose levels BID 1 kit 0  . carvedilol (COREG) 6.25 MG tablet Take 1 tablet (6.25 mg total) by mouth 2 (two) times daily with a meal. 180 tablet 3  . cetirizine (ZYRTEC ALLERGY) 10 MG tablet Take 1 tablet (10 mg  total) by mouth daily. 30 tablet 11  . clopidogrel (PLAVIX) 75 MG tablet TAKE 1 TABLET BY MOUTH ONCE DAILY 30 tablet 11  . digoxin (LANOXIN) 0.125 MG tablet TAKE 1 TABLET BY MOUTH ONCE DAILY (Patient taking differently: Take 0.125 mg by mouth daily. ) 30 tablet 11  . empagliflozin (JARDIANCE) 10 MG TABS tablet Take 10 mg by mouth daily. 30 tablet 6  . fluticasone (FLONASE) 50 MCG/ACT nasal spray Place 2 sprays into both nostrils daily. 16 g 11  . furosemide (LASIX) 40 MG tablet Take 40 mg by mouth daily.    [name] Kitchen glucose blood (ACCU-CHEK GUIDE) test strip Use to monitor glucose levels BID 100 each 12  . Insulin Glargine (BASAGLAR KWIKPEN) 100 UNIT/ML SOPN Inject 1.4 mLs (140 Units total) into the skin every morning. And pen needles 1/day 20 pen prn  . Lancets 28G MISC Use for glucose testing up to BID 100 each 12  . Lancets Misc. (ACCU-CHEK FASTCLIX LANCET) KIT Use to monitor glucose levels BID 200 kit 1  . Potassium Chloride ER 20 MEQ TBCR Take 20 mEq by mouth daily. 30 tablet 3  . sacubitril-valsartan (ENTRESTO) 97-103 MG Take 1 tablet by mouth 2 (two) times daily. 60 tablet 11   No current facility-administered medications for this encounter.    Facility-Administered Medications Ordered in Other Encounters  Medication Dose Route Frequency Provider Last Rate Last Dose  . perflutren lipid microspheres (DEFINITY) IV suspension  1-10 mL Intravenous PRN Caralynn Gelber, Shaune Pascal, MD   2 mL at 08/08/18 0956    Allergies  Allergen Reactions  . Nsaids Anaphylaxis and Other (See Comments)    Able to tolerate aspirin     PHYSICAL EXAM: Vitals:   08/08/18 1002  BP: 140/84  Pulse: 93  SpO2: 99%  Weight: 129.3 kg (285 lb)   Wt Readings from Last 3 Encounters:  08/08/18 129.3 kg (285 lb)  07/18/18 127.9 kg (282 lb)  06/20/18 130.6 kg (288 lb)   General:  Well appearing. No resp difficulty HEENT: normal Neck: supple. no JVD. Carotids 2+ bilat; no bruits. No lymphadenopathy or thryomegaly  appreciated. Cor: PMI nondisplaced. Regular rate & rhythm. No rubs, gallops or murmurs. Lungs: clear Abdomen: obese soft, nontender, nondistended. No hepatosplenomegaly. No bruits or masses. Good bowel sounds. Extremities: no cyanosis, clubbing, rash, edema Neuro: alert & orientedx3, cranial nerves grossly intact. moves all 4 extremities w/o difficulty. Affect pleasant    ASSESSMENT & PLAN:  1.Chronic Systolic Heart Failure - NICM [facility] 2012 Non Obstructive CAD. In the past he has refused ICD, but with recent changes now willing. R/LHC 08/2017: diffuse 3v CAD with diabetic vasculopathy, no targets for intervention; ICM EF 30-35%;  - ECHO 10/18 EF 20-25%, grade 2 DD - RHC 09/26/17 looked good (PCW  11 Fick 6.4/2.6) - Echo today reviewed personally EF 25-30% - Currently refuses ICD.  - Volume status stable.  - Increase coreg to 9.375 mg BID - Continue digoxin 0.125 mg daily.  - Continue Entresto to 97/103 mg BID.  - Continue Jardiance - Will eventually need Bidil. Start at next visit after surgery - [name] stopped previously due to hyperkalemia - Reinforced fluid restriction to < 2 L daily, sodium restriction to less than 2000 mg daily, and the importance of daily weights.  2. CAD - Cath 09/26/17 reviewed 3v CAD (LAD p80, d95, OM1 95%, OM2 95%, RCA 70%, PDA 85%). No clear targets for revascularization with poor distal targets.  - no s/s ischemia - Continue ASA, statin  3. CVA - In [place] 2/9 - 09/09/17. CT with subacute R cerebellar changes. MRI report from [place] with no acute or subacute process.  - no new deficits  - Continue ASA and statin.  5. DMII-  - Improving on insulin and jardiance - Hgb a1c 11-> 8 6. OSA  - No longer has sleep apnea per patient. Not wearing CPAP.  7. HTN: - Mildly elevavted 8. Pre-op CV evaluation  - He has significant CAD and low EF however functional capacity is quite good. Overall moderate risk for peri-operative CV complications but can proceed without  further cardiac testing. If possible would avoid GA and do surgery under a block with conscious sedation, if possible   Glori Bickers, MD  10:14 AM

## 2018-08-08 NOTE — Addendum Note (Signed)
Encounter addended by: Marisa Hua, RN on: 08/08/2018 10:36 AM  Actions taken: Order list changed, Diagnosis association updated, Charge Capture section accepted, Clinical Note Signed

## 2018-08-08 NOTE — H&P (View-Only) (Signed)
ADVANCED HF CLINIC NOTE  Patient ID: Mitchell [name], male   DOB: 1971-01-17, 48 y.o.   MRN: [id number]  PCP: None  M[facility]: [facility]  Pulmonologist: dr Gwenette Greet  HPI:  Mitchell [name] is a 48 y.o. male with multiple medical problems including obesity, uncontrolled hypertension, uncontrolled diabetes, obstructive sleep apnea, bipolar disease/schizophrenia, CVA, nonobstructive coronary disease and systolic congestive heart failure secondary to mixed ischemic/nonischemic cardiomyopathy.He is S/P multiple tooth extractions 01/27/14.   Admitted to [facility] 6/10 through 01/09/14 with uncontrolled diabetes. Started back on insulin. He was also referred to Dr Enrique Sack for teeth extractions. Bidil stopped due to suspected syncope. Discharge weight 276 pounds.   Had [facility] on 09/26/17 with 3v disease and preserved CO/CI. No revascularization options. Saw EP about ICD. He has been given some dates to choose from and is working to get his blood sugar under control. He did not f/u with EP.   Returns for f/u and surgical clearance. Says he had infection in the bone in his right foot. Pending partial resection of right 5th toe. Gets around [facility] No CP or SOB. Says he can walk 2 miles per day without problems. No orthopnea or PND.   ECHO today EF 25-30%  Echo 2/13:  EF 15-20% with mild to moderate mitral valve regurgitation ECHO 10/30/12 EF 20-25%  ECHO 11/22/13 EF 20-25%  ECHO 05/2016: EF 20-25%.  ECHO 04/2017: EF 20-25%, grade 2 DD  RHC 09/26/17:  Findings: RA = 8 RV = 43/10 PA = 43/16 (26) PCW = 11 Fick cardiac output/index = 6.4/2.6 PVR = 2.3 WU Ao sat = 91% PA sat = 64%, 65% Assessment: 1. Diffuse 3v CAD with diabetic vasculopathy. No clear targets for intervention 2. Ischemic CM EF 30-35% 3. Well-compensated hemodynamics  SH: Lives at home with wife and children. Does not smoke or drink alcohol. FH: Mom had heart diease  Review of systems complete and found to be negative unless listed in HPI.      Past Medical History:  Diagnosis Date  . Bipolar disorder ([place])   . CHF (congestive heart failure) ([place])   . Chronic systolic heart failure ([place]) 11/2010   a. NICM 4/14 EF 20-25%, TR mild  . CVA (cerebral infarction)    No residual deficits  . Depression    PTSD,   . Diabetes mellitus    TYPE II; UNCONTROLLED BY HEMOGLOBIN A1c; STABLE AS  PER DISCHARGE  . Headache(784.0)   . 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 History of colonic polyps   . Hyperlipidemia   . Hypertension   . Myocardial infarction ([place]) 1987   (while playing football)  . Non-ischemic cardiomyopathy (HCC)    No ischemia on myoview, showed EF of 42%. 2D echo showed EF 67-61% with diatolic dysfunction in 9509  . Obesity   . OSA (obstructive sleep apnea)    Being set up again for c-pap. Pt sts he does not use a cpap, and the test was negative.  . Pneumonia   . Post-cardiac injury syndrome ([place])    History of cardiac injury from blunt trauma  . PVCs (premature ventricular contractions)   . Schizophrenia ([place])    Goes to [facility]  . SOB (shortness of breath)   . Stroke [facility]) 2005   some left side weakness  . Syncope    Recurrent, thought to be vasovagal. Also has h/o frequent PVCs.     Current Outpatient Medications  Medication Sig Dispense Refill  . amoxicillin-clavulanate (  AUGMENTIN) 500-125 MG tablet Take 1 tablet (500 mg total) by mouth 3 (three) times daily. 90 tablet 0  . aspirin 81 MG EC tablet Take 1 tablet (81 mg total) by mouth daily. 90 tablet 3  . atorvastatin (LIPITOR) 40 MG tablet TAKE 2 TABLETS BY MOUTH AT BEDTIME 60 tablet 11  . Blood Glucose Monitoring Suppl (ACCU-CHEK GUIDE ME) w/Device KIT 1 each by Does not apply route 2 (two) times daily. Use to monitor glucose levels BID 1 kit 0  . carvedilol (COREG) 6.25 MG tablet Take 1 tablet (6.25 mg total) by mouth 2 (two) times daily with a meal. 180 tablet 3  . cetirizine (ZYRTEC ALLERGY) 10 MG tablet Take 1 tablet (10 mg  total) by mouth daily. 30 tablet 11  . clopidogrel (PLAVIX) 75 MG tablet TAKE 1 TABLET BY MOUTH ONCE DAILY 30 tablet 11  . digoxin (LANOXIN) 0.125 MG tablet TAKE 1 TABLET BY MOUTH ONCE DAILY (Patient taking differently: Take 0.125 mg by mouth daily. ) 30 tablet 11  . empagliflozin (JARDIANCE) 10 MG TABS tablet Take 10 mg by mouth daily. 30 tablet 6  . fluticasone (FLONASE) 50 MCG/ACT nasal spray Place 2 sprays into both nostrils daily. 16 g 11  . furosemide (LASIX) 40 MG tablet Take 40 mg by mouth daily.    [name] Kitchen glucose blood (ACCU-CHEK GUIDE) test strip Use to monitor glucose levels BID 100 each 12  . Insulin Glargine (BASAGLAR KWIKPEN) 100 UNIT/ML SOPN Inject 1.4 mLs (140 Units total) into the skin every morning. And pen needles 1/day 20 pen prn  . Lancets 28G MISC Use for glucose testing up to BID 100 each 12  . Lancets Misc. (ACCU-CHEK FASTCLIX LANCET) KIT Use to monitor glucose levels BID 200 kit 1  . Potassium Chloride ER 20 MEQ TBCR Take 20 mEq by mouth daily. 30 tablet 3  . sacubitril-valsartan (ENTRESTO) 97-103 MG Take 1 tablet by mouth 2 (two) times daily. 60 tablet 11   No current facility-administered medications for this encounter.    Facility-Administered Medications Ordered in Other Encounters  Medication Dose Route Frequency Provider Last Rate Last Dose  . perflutren lipid microspheres (DEFINITY) IV suspension  1-10 mL Intravenous PRN Bensimhon, Shaune Pascal, MD   2 mL at 08/08/18 0956    Allergies  Allergen Reactions  . Nsaids Anaphylaxis and Other (See Comments)    Able to tolerate aspirin     PHYSICAL EXAM: Vitals:   08/08/18 1002  BP: 140/84  Pulse: 93  SpO2: 99%  Weight: 129.3 kg (285 lb)   Wt Readings from Last 3 Encounters:  08/08/18 129.3 kg (285 lb)  07/18/18 127.9 kg (282 lb)  06/20/18 130.6 kg (288 lb)   General:  Well appearing. No resp difficulty HEENT: normal Neck: supple. no JVD. Carotids 2+ bilat; no bruits. No lymphadenopathy or thryomegaly  appreciated. Cor: PMI nondisplaced. Regular rate & rhythm. No rubs, gallops or murmurs. Lungs: clear Abdomen: obese soft, nontender, nondistended. No hepatosplenomegaly. No bruits or masses. Good bowel sounds. Extremities: no cyanosis, clubbing, rash, edema Neuro: alert & orientedx3, cranial nerves grossly intact. moves all 4 extremities w/o difficulty. Affect pleasant    ASSESSMENT & PLAN:  1.Chronic Systolic Heart Failure - NICM [facility] 2012 Non Obstructive CAD. In the past he has refused ICD, but with recent changes now willing. R/LHC 08/2017: diffuse 3v CAD with diabetic vasculopathy, no targets for intervention; ICM EF 30-35%;  - ECHO 10/18 EF 20-25%, grade 2 DD - RHC 09/26/17 looked good (PCW  11 Fick 6.4/2.6) - Echo today reviewed personally EF 25-30% - Currently refuses ICD.  - Volume status stable.  - Increase coreg to 9.375 mg BID - Continue digoxin 0.125 mg daily.  - Continue Entresto to 97/103 mg BID.  - Continue Jardiance - Will eventually need Bidil. Start at next visit after surgery - [name] stopped previously due to hyperkalemia - Reinforced fluid restriction to < 2 L daily, sodium restriction to less than 2000 mg daily, and the importance of daily weights.  2. CAD - Cath 09/26/17 reviewed 3v CAD (LAD p80, d95, OM1 95%, OM2 95%, RCA 70%, PDA 85%). No clear targets for revascularization with poor distal targets.  - no s/s ischemia - Continue ASA, statin  3. CVA - In [place] 2/9 - 09/09/17. CT with subacute R cerebellar changes. MRI report from [place] with no acute or subacute process.  - no new deficits  - Continue ASA and statin.  5. DMII-  - Improving on insulin and jardiance - Hgb a1c 11-> 8 6. OSA  - No longer has sleep apnea per patient. Not wearing CPAP.  7. HTN: - Mildly elevavted 8. Pre-op CV evaluation  - He has significant CAD and low EF however functional capacity is quite good. Overall moderate risk for peri-operative CV complications but can proceed without  further cardiac testing. If possible would avoid GA and do surgery under a block with conscious sedation, if possible   Glori Bickers, MD  10:14 AM

## 2018-08-08 NOTE — Patient Instructions (Addendum)
INCREASE Coreg to 9.375mg  (1.5 tab) twice a day  Labs today We will only contact you if something comes back abnormal or we need to make some changes. Otherwise no news is good news!  Our office will fax note to Dr. [facility] for clearance.   Your physician recommends that you schedule a follow-up appointment in: 6 months. We will call you to schedule your appointment.

## 2018-08-08 NOTE — Telephone Encounter (Signed)
Dr. Gala Romney faxed clinical records stating Mitchell [name] is medically cleared to have his surgery scheduled for January 30.

## 2018-08-19 DIAGNOSIS — E11621 Type 2 diabetes mellitus with foot ulcer: Secondary | ICD-10-CM | POA: Diagnosis not present

## 2018-08-20 ENCOUNTER — Ambulatory Visit (INDEPENDENT_AMBULATORY_CARE_PROVIDER_SITE_OTHER): Payer: No Typology Code available for payment source | Admitting: Endocrinology

## 2018-08-20 ENCOUNTER — Encounter: Payer: Self-pay | Admitting: Endocrinology

## 2018-08-20 ENCOUNTER — Telehealth: Payer: Self-pay | Admitting: *Deleted

## 2018-08-20 VITALS — BP 128/74 | HR 64 | Ht 71.0 in | Wt 288.6 lb

## 2018-08-20 DIAGNOSIS — E118 Type 2 diabetes mellitus with unspecified complications: Secondary | ICD-10-CM | POA: Diagnosis not present

## 2018-08-20 LAB — POCT GLYCOSYLATED HEMOGLOBIN (HGB A1C): Hemoglobin A1C: 7.9 % — AB (ref 4.0–5.6)

## 2018-08-20 MED ORDER — BASAGLAR KWIKPEN 100 UNIT/ML ~~LOC~~ SOPN: 150.0000 [IU] | PEN_INJECTOR | SUBCUTANEOUS | 99 refills | Status: DC

## 2018-08-20 NOTE — Progress Notes (Signed)
Subjective:    Patient ID: Mitchell [name], male    DOB: 1970/10/22, 48 y.o.   MRN: [id number]  HPI Pt returns for f/u of diabetes mellitus: DM type: Insulin-requiring type 2 Dx'ed: 9480 Complications: polyneuropathy, foot ulcer, and CVA.   Therapy: insulin since 2017, and jardiance.   DKA: never Severe hypoglycemia: never Pancreatitis: never Pancreatic imaging: normal on 2012 CT.  Other: he declines multiple daily injections; CHF is a relative contraindication to metformin.   Interval history:  Pt says he never misses the insulin.  no cbg record, but states cbg's are in the 100's.  He is running out of jardiance, and ins declines to renew.   Past Medical History:  Diagnosis Date  . Bipolar disorder ([place])   . CHF (congestive heart failure) ([place])   . Chronic systolic heart failure ([place]) 11/2010   a. NICM 4/14 EF 20-25%, TR mild  . CVA (cerebral infarction)    No residual deficits  . Depression    PTSD,   . Diabetes mellitus    TYPE II; UNCONTROLLED BY HEMOGLOBIN A1c; STABLE AS  PER DISCHARGE  . Headache(784.0)   . 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 History of colonic polyps   . Hyperlipidemia   . Hypertension   . Myocardial infarction ([place]) 1987   (while playing football)  . Non-ischemic cardiomyopathy (HCC)    No ischemia on myoview, showed EF of 42%. 2D echo showed EF 16-55% with diatolic dysfunction in 3748  . Obesity   . OSA (obstructive sleep apnea)    Being set up again for c-pap. Pt sts he does not use a cpap, and the test was negative.  . Pneumonia   . Post-cardiac injury syndrome ([place])    History of cardiac injury from blunt trauma  . PVCs (premature ventricular contractions)   . Schizophrenia ([place])    Goes to [facility]  . SOB (shortness of breath)   . Stroke [facility]) 2005   some left side weakness  . Syncope    Recurrent, thought to be vasovagal. Also has h/o frequent PVCs.     Past Surgical History:  Procedure Laterality Date  .  CARDIAC CATHETERIZATION  12/19/10   DIFFUSE NONOBSTRUCTIVE CAD; NONISCHEMIC CARDIOMYOPATHY; LEFT VENTRICULAR ANGIOGRAM WAS PERFORMED SECONDARY TO  ELEVATED LEFT VENTRICULAR FILLING PRESSURES  . COLONOSCOPY W/ POLYPECTOMY    . MULTIPLE EXTRACTIONS WITH ALVEOLOPLASTY  01/27/2014   "all my teeth; 4 Quadrants of alveoloplasty  . MULTIPLE EXTRACTIONS WITH ALVEOLOPLASTY N/A 01/27/2014   Procedure: EXTRACTION OF TEETH #'1, 2, 3, 4, 5, 6, 7, 8, 9, 10, 11, 12, 13, 14, 15, 16, 17, 20, 21, 22, 23, 24, 25, 26, 27, 28, 29, 31 and 32 WITH ALVEOLOPLASTY;  Surgeon: Lenn Cal, DDS;  Location: [facility];  Service: Oral Surgery;  Laterality: N/A;  . ORIF FINGER / THUMB FRACTURE Right   . RIGHT/LEFT HEART CATH AND CORONARY ANGIOGRAPHY N/A 09/26/2017   Procedure: RIGHT/LEFT HEART CATH AND CORONARY ANGIOGRAPHY;  Surgeon: Jolaine Artist, MD;  Location: [facility];  Service: Cardiovascular;  Laterality: N/A;    Social History   Socioeconomic History  . Marital status: Married    Spouse name: Not on file  . Number of children: 5  . Years of education: Not on file  . Highest education level: Not on file  Occupational History  . Occupation: Dietitian  Social Needs  . Financial resource strain: Not on file  . Food insecurity:  Worry: Not on file    Inability: Not on file  . Transportation needs:    Medical: Not on file    Non-medical: Not on file  Tobacco Use  . Smoking status: Never Smoker  . Smokeless tobacco: Never Used  Substance and Sexual Activity  . Alcohol use: No  . Drug use: No  . Sexual activity: Not on file  Lifestyle  . Physical activity:    Days per week: Not on file    Minutes per session: Not on file  . Stress: Not on file  Relationships  . Social connections:    Talks on phone: Not on file    Gets together: Not on file    Attends religious service: Not on file    Active member of club or organization: Not on file    Attends meetings of clubs or organizations: Not  on file    Relationship status: Not on file  . Intimate partner violence:    Fear of current or ex partner: Not on file    Emotionally abused: Not on file    Physically abused: Not on file    Forced sexual activity: Not on file  Other Topics Concern  . 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    Current Outpatient Medications on File Prior to Visit  Medication Sig Dispense Refill  . amoxicillin-clavulanate (AUGMENTIN) 500-125 MG tablet Take 1 tablet (500 mg total) by mouth 3 (three) times daily. 90 tablet 0  . aspirin 81 MG EC tablet Take 1 tablet (81 mg total) by mouth daily. 90 tablet 3  . atorvastatin (LIPITOR) 40 MG tablet TAKE 2 TABLETS BY MOUTH AT BEDTIME (Patient taking differently: Take 80 mg by mouth at bedtime. ) 60 tablet 11  . Blood Glucose Monitoring Suppl (ACCU-CHEK GUIDE ME) w/Device KIT 1 each by Does not apply route 2 (two) times daily. Use to monitor glucose levels BID 1 kit 0  . carvedilol (COREG) 6.25 MG tablet Take 1.5 tablets (9.375 mg total) by mouth 2 (two) times daily with a meal. 90 tablet 3  . cetirizine (ZYRTEC ALLERGY) 10 MG tablet Take 1 tablet (10 mg total) by mouth daily. 30 tablet 11  . clopidogrel (PLAVIX) 75 MG tablet TAKE 1 TABLET BY MOUTH ONCE DAILY (Patient taking differently: Take 75 mg by mouth daily. ) 30 tablet 11  . digoxin (LANOXIN) 0.125 MG tablet TAKE 1 TABLET BY MOUTH ONCE DAILY (Patient taking differently: Take 0.125 mg by mouth daily. ) 30 tablet 11  . fluticasone (FLONASE) 50 MCG/ACT nasal spray Place 2 sprays into both nostrils daily. 16 g 11  . furosemide (LASIX) 40 MG tablet Take 40 mg by mouth daily.    [name] Kitchen glucose blood (ACCU-CHEK GUIDE) test strip Use to monitor glucose levels BID 100 each 12  . Lancets 28G MISC Use for glucose testing up to BID 100 each 12  . Lancets Misc. (ACCU-CHEK  FASTCLIX LANCET) KIT Use to monitor glucose levels BID 200 kit 1  . Potassium Chloride ER 20 MEQ TBCR Take 20 mEq by mouth daily. 30 tablet 3  . sacubitril-valsartan (ENTRESTO) 97-103 MG Take 1 tablet by mouth 2 (two) times daily. 60 tablet 11   No current facility-administered medications on file prior to visit.     Allergies  Allergen Reactions  .  Nsaids Anaphylaxis and Other (See Comments)    Able to tolerate aspirin     Family History  Problem Relation Age of Onset  . Heart disease Mother        MI  . Heart failure Mother   . Diabetes Mother        ALSO IN MOST OF HIS SIBLINGS; 2 UNLCES HAVE ALSO PASSED AWAY FROM DM  . Cardiomyopathy Mother   . Cancer - Ovarian Mother   . Heart disease Father   . Hypertension Father   . Diabetes Father   . Diabetes Sister   . Diabetes Brother     BP 128/74 (BP Location: Right Arm, Patient Position: Sitting, Cuff Size: Large)   Pulse 64   Ht 5' 11"  (1.803 m)   Wt 288 lb 9.6 oz (130.9 kg)   SpO2 98%   BMI 40.25 kg/m    Review of Systems He denies hypoglycemia.     Objective:   Physical Exam VITAL SIGNS:  See vs page GENERAL: no distress Pulses: foot pulses are intact bilaterally.   MSK: no deformity of the feet or ankles.  CV: trace bilat edema of the legs.   Skin:  normal color and temp on the feet and ankles.  right foot is still bandaged (sees wound care) Neuro: sensation is intact to touch on the feet, but decreased from normal.   EXTEMITIES:There is bilateral onychomycosis of the toenails.    Lab Results  Component Value Date   HGBA1C 7.9 (A) 08/20/2018   Lab Results  Component Value Date   CREATININE 1.11 08/08/2018   BUN 15 08/08/2018   NA 144 08/08/2018   K 4.3 08/08/2018   CL 108 08/08/2018   CO2 29 08/08/2018       Assessment & Plan:  Insulin-requiring type 2 DM: this is the best control this pt should aim for, given this regimen, which does match insulin to her changing needs throughout the day.  She  needs to increase insulin to make up for the discontinuation of jardiance.   Edema: this limits rx options  Patient Instructions  check your blood sugar twice a day.  vary the time of day when you check, between before the 3 meals, and at bedtime.  also check if you have symptoms of your blood sugar being too high or too low.  please keep a record of the readings and bring it to your next appointment here (or you can bring the meter itself).  You can write it on any piece of paper.  please call us sooner if your blood sugar goes below 70, or if you have a lot of readings over 200.  I have sent a prescription to your pharmacy: to increase the insulin to 150 units each morning.  This will make up for the jardiance.   Please come back for a follow-up appointment in 2 months.

## 2018-08-20 NOTE — Pre-Procedure Instructions (Signed)
Mitchell [name]  08/20/2018      [facility] [address], [place] - [address] [place] [address] Phone: [phone] Fax: [phone]    Your procedure is scheduled on January 30th.  Report to [facility] Admitting at 5:30 A.M.  Call this number if you have problems the morning of surgery:  [phone]   Remember:  Do not eat or drink after midnight.     Take these medicines the morning of surgery with A SIP OF WATER   Carvedilol (Coreg)  Flonase - if needed  Entresto  Follow your surgeon's instructions on when to stop Asprin & Plavix.  If no instructions were given by your surgeon then you will need to call the office to get those instructions.    7 days prior to surgery STOP taking any Aleve, Naproxen, Ibuprofen, Motrin, Advil, Goody's, BC's, all herbal medications, fish oil, and all vitamins.   WHAT DO I DO ABOUT MY DIABETES MEDICATION?   . THE MORNING OF SURGERY, take 75 units of Insulin Glargine (Basaglar).   How to Manage Your Diabetes Before and After Surgery  Why is it important to control my blood sugar before and after surgery? . Improving blood sugar levels before and after surgery helps healing and can limit problems. . A way of improving blood sugar control is eating a healthy diet by: o  Eating less sugar and carbohydrates o  Increasing activity/exercise o  Talking with your doctor about reaching your blood sugar goals . High blood sugars (greater than 180 mg/dL) can raise your risk of infections and slow your recovery, so you will need to focus on controlling your diabetes during the weeks before surgery. . Make sure that the doctor who takes care of your diabetes knows about your planned surgery including the date and location.  How do I manage my blood sugar before surgery? . Check your blood sugar at least 4 times a day, starting 2 days before surgery, to make sure that the level is not  too high or low. o Check your blood sugar the morning of your surgery when you wake up and every 2 hours until you get to the Short Stay unit. . If your blood sugar is less than 70 mg/dL, you will need to treat for low blood sugar: o Do not take insulin. o Treat a low blood sugar (less than 70 mg/dL) with  cup of clear juice (cranberry or apple), 4 glucose tablets, OR glucose gel. o Recheck blood sugar in 15 minutes after treatment (to make sure it is greater than 70 mg/dL). If your blood sugar is not greater than 70 mg/dL on recheck, call [phone] for further instructions. . Report your blood sugar to the short stay nurse when you get to Short Stay.  . If you are admitted to the hospital after surgery: o Your blood sugar will be checked by the staff and you will probably be given insulin after surgery (instead of oral diabetes medicines) to make sure you have good blood sugar levels. o The goal for blood sugar control after surgery is 80-180 mg/dL.     Do not wear jewelry.  Do not wear lotions, powders, or perfumes, or deodorant.  Men may shave face and neck.  Do not bring valuables to the hospital.  [facility] is not responsible for any belongings or valuables.   [place]- Preparing For Surgery  Before surgery, you can play an important  role. Because skin is not sterile, your skin needs to be as free of germs as possible. You can reduce the number of germs on your skin by washing with CHG (chlorahexidine gluconate) Soap before surgery.  CHG is an antiseptic cleaner which kills germs and bonds with the skin to continue killing germs even after washing.    Oral Hygiene is also important to reduce your risk of infection.  Remember - BRUSH YOUR TEETH THE MORNING OF SURGERY WITH YOUR REGULAR TOOTHPASTE  Please do not use if you have an allergy to CHG or antibacterial soaps. If your skin becomes reddened/irritated stop using the CHG.  Do not shave (including legs and underarms) for  at least 48 hours prior to first CHG shower. It is OK to shave your face.  Please follow these instructions carefully.   1. Shower the NIGHT BEFORE SURGERY and the MORNING OF SURGERY with CHG.   2. If you chose to wash your hair, wash your hair first as usual with your normal shampoo.  3. After you shampoo, rinse your hair and body thoroughly to remove the shampoo.  4. Use CHG as you would any other liquid soap. You can apply CHG directly to the skin and wash gently with a scrungie or a clean washcloth.   5. Apply the CHG Soap to your body ONLY FROM THE NECK DOWN.  Do not use on open wounds or open sores. Avoid contact with your eyes, ears, mouth and genitals (private parts). Wash Face and genitals (private parts)  with your normal soap.  6. Wash thoroughly, paying special attention to the area where your surgery will be performed.  7. Thoroughly rinse your body with warm water from the neck down.  8. DO NOT shower/wash with your normal soap after using and rinsing off the CHG Soap.  9. Pat yourself dry with a CLEAN TOWEL.  10. Wear CLEAN PAJAMAS to bed the night before surgery, wear comfortable clothes the morning of surgery  11. Place CLEAN SHEETS on your bed the night of your first shower and DO NOT SLEEP WITH PETS.   Day of Surgery:  Do not apply any deodorants/lotions.  Please wear clean clothes to the hospital/surgery center.   Remember to brush your teeth WITH YOUR REGULAR TOOTHPASTE.   Contacts, dentures or bridgework may not be worn into surgery.  Leave your suitcase in the car.  After surgery it may be brought to your room.  For patients admitted to the hospital, discharge time will be determined by your treatment team.  Patients discharged the day of surgery will not be allowed to drive home.   Please read over the following fact sheets that you were given. Coughing and Deep Breathing and Surgical Site Infection Prevention

## 2018-08-20 NOTE — Patient Instructions (Addendum)
check your blood sugar twice a day.  vary the time of day when you check, between before the 3 meals, and at bedtime.  also check if you have symptoms of your blood sugar being too high or too low.  please keep a record of the readings and bring it to your next appointment here (or you can bring the meter itself).  You can write it on any piece of paper.  please call us sooner if your blood sugar goes below 70, or if you have a lot of readings over 200.  I have sent a prescription to your pharmacy: to increase the insulin to 150 units each morning.  This will make up for the jardiance.   Please come back for a follow-up appointment in 2 months.

## 2018-08-20 NOTE — Progress Notes (Signed)
Called office, spoke to [facility], requested surgical clearance to be faxed.  Per surgeon's office--ok to continue Plavix as per previous surgery instructions.

## 2018-08-20 NOTE — Telephone Encounter (Signed)
"  Can you fax [place] a copy of Mr. Groves cardiac clearance from Dr. Gala Romney?"  What's your fax number?  "Send it to [phone]."  I'll send it over.

## 2018-08-20 NOTE — Telephone Encounter (Signed)
"  I'm at the wound care center with Dr. Roxan Hockey.  I'm supposed to have surgery coming up.  You all have not called me.  They need to know when my surgery date is.  The hospital called me for a pre-op, I'm supposed to have that on Wednesday morning at 9 am.  Could someone please call me back?  The heart doctor has already approved for me to have the surgery but I haven't heard from you all yet.  Dr. Logan Bores is my surgeon."

## 2018-08-21 ENCOUNTER — Encounter (HOSPITAL_COMMUNITY): Payer: Self-pay

## 2018-08-21 ENCOUNTER — Encounter (HOSPITAL_COMMUNITY)
Admission: RE | Admit: 2018-08-21 | Discharge: 2018-08-21 | Disposition: A | Discharge status: Home / Self Care | Payer: No Typology Code available for payment source | Source: Ambulatory Visit | Attending: Podiatry | Admitting: Podiatry

## 2018-08-21 ENCOUNTER — Other Ambulatory Visit: Payer: Self-pay

## 2018-08-21 DIAGNOSIS — Z01812 Encounter for preprocedural laboratory examination: Secondary | ICD-10-CM | POA: Insufficient documentation

## 2018-08-21 LAB — CBC
HCT: 46.3 % (ref 39.0–52.0)
HEMOGLOBIN: 14.8 g/dL (ref 13.0–17.0)
MCH: 29.7 pg (ref 26.0–34.0)
MCHC: 32 g/dL (ref 30.0–36.0)
MCV: 92.8 fL (ref 80.0–100.0)
Platelets: 187 10*3/uL (ref 150–400)
RBC: 4.99 MIL/uL (ref 4.22–5.81)
RDW: 12.7 % (ref 11.5–15.5)
WBC: 11.4 10*3/uL — ABNORMAL HIGH (ref 4.0–10.5)
nRBC: 0 % (ref 0.0–0.2)

## 2018-08-21 LAB — BASIC METABOLIC PANEL
Anion gap: 11 (ref 5–15)
BUN: 21 mg/dL — ABNORMAL HIGH (ref 6–20)
CO2: 24 mmol/L (ref 22–32)
Calcium: 9 mg/dL (ref 8.9–10.3)
Chloride: 106 mmol/L (ref 98–111)
Creatinine, Ser: 1.28 mg/dL — ABNORMAL HIGH (ref 0.61–1.24)
GFR calc Af Amer: >60 mL/min (ref >60)
Glucose, Bld: 170 mg/dL — ABNORMAL HIGH (ref 70–99)
Potassium: 4.5 mmol/L (ref 3.5–5.1)
Sodium: 141 mmol/L (ref 135–145)

## 2018-08-21 LAB — GLUCOSE, CAPILLARY: GLUCOSE-CAPILLARY: 175 mg/dL — AB (ref 70–99)

## 2018-08-21 NOTE — Progress Notes (Addendum)
PCP: Dr. Patsy Lager Cardiologist: Dr. Rolla Plate in chart Endocrinologist: Dr. Everardo All  EKG: 03/09/2018 CXR: n/a ECHO: 08-08-2018 Stress Test: denies Cardiac Cath: 09-26-2017 Sleep Study: 08-11-2016---does not use CPAP, states negative  Fasting Blood Sugar- 130-145 Checks Blood Sugar once daily.  ASA and Plavix: Called Dr. Michel Harrow office 08/20/2018--"okay to continue."  Patient denies shortness of breath, fever, cough, and chest pain at PAT appointment.  Patient verbalized understanding of instructions provided today at the PAT appointment.  Patient asked to review instructions at home and day of surgery.   Chart sent for anesthesia review

## 2018-08-21 NOTE — Pre-Procedure Instructions (Signed)
Mitchell [name]  08/21/2018     Your procedure is scheduled on January 30th.  Report to [facility] Admitting at 5:30 A.M.  Call this number if you have problems the morning of surgery:  [phone]   Remember:  Do not eat or drink after midnight.     Take these medicines the morning of surgery with A SIP OF WATER   Carvedilol (Coreg)  Cetirizine (Zyrtec)  Digoxin (Lanoxin)  Flonase - if needed    Follow your surgeon's instructions on when to stop Asprin & Plavix.  If no instructions were given by your surgeon then you will need to call the office to get those instructions.    7 days prior to surgery STOP taking any Aleve, Naproxen, Ibuprofen, Motrin, Advil, Goody's, BC's, all herbal medications, fish oil, and all vitamins.   WHAT DO I DO ABOUT MY DIABETES MEDICATION?   . THE MORNING OF SURGERY, take 75 units of Insulin Glargine (Basaglar [place]) which is half of your normal dose.     How to Manage Your Diabetes Before and After Surgery  Why is it important to control my blood sugar before and after surgery? . Improving blood sugar levels before and after surgery helps healing and can limit problems. . A way of improving blood sugar control is eating a healthy diet by: o  Eating less sugar and carbohydrates o  Increasing activity/exercise o  Talking with your doctor about reaching your blood sugar goals . High blood sugars (greater than 180 mg/dL) can raise your risk of infections and slow your recovery, so you will need to focus on controlling your diabetes during the weeks before surgery. . Make sure that the doctor who takes care of your diabetes knows about your planned surgery including the date and location.  How do I manage my blood sugar before surgery? . Check your blood sugar at least 4 times a day, starting 2 days before surgery, to make sure that the level is not too high or low. o Check your blood sugar the morning of your surgery when you  wake up and every 2 hours until you get to the Short Stay unit. . If your blood sugar is less than 70 mg/dL, you will need to treat for low blood sugar: o Do not take insulin. o Treat a low blood sugar (less than 70 mg/dL) with  cup of clear juice (cranberry or apple), 4 glucose tablets, OR glucose gel. o Recheck blood sugar in 15 minutes after treatment (to make sure it is greater than 70 mg/dL). If your blood sugar is not greater than 70 mg/dL on recheck, call [phone] for further instructions. . Report your blood sugar to the short stay nurse when you get to Short Stay.  . If you are admitted to the hospital after surgery: o Your blood sugar will be checked by the staff and you will probably be given insulin after surgery (instead of oral diabetes medicines) to make sure you have good blood sugar levels. o The goal for blood sugar control after surgery is 80-180 mg/dL.     Do not wear jewelry.  Do not wear lotions, powders, or perfumes, or deodorant.  Men may shave face and neck.  Do not bring valuables to the hospital.  [facility] is not responsible for any belongings or valuables.   [place]- Preparing For Surgery  Before surgery, you can play an important role. Because skin is not sterile, your skin  needs to be as free of germs as possible. You can reduce the number of germs on your skin by washing with CHG (chlorahexidine gluconate) Soap before surgery.  CHG is an antiseptic cleaner which kills germs and bonds with the skin to continue killing germs even after washing.    Oral Hygiene is also important to reduce your risk of infection.  Remember - BRUSH YOUR TEETH THE MORNING OF SURGERY WITH YOUR REGULAR TOOTHPASTE  Please do not use if you have an allergy to CHG or antibacterial soaps. If your skin becomes reddened/irritated stop using the CHG.  Do not shave (including legs and underarms) for at least 48 hours prior to first CHG shower. It is OK to shave your  face.  Please follow these instructions carefully.   1. Shower the NIGHT BEFORE SURGERY and the MORNING OF SURGERY with CHG.   2. If you chose to wash your hair, wash your hair first as usual with your normal shampoo.  3. After you shampoo, rinse your hair and body thoroughly to remove the shampoo.  4. Use CHG as you would any other liquid soap. You can apply CHG directly to the skin and wash gently with a scrungie or a clean washcloth.   5. Apply the CHG Soap to your body ONLY FROM THE NECK DOWN.  Do not use on open wounds or open sores. Avoid contact with your eyes, ears, mouth and genitals (private parts). Wash Face and genitals (private parts)  with your normal soap.  6. Wash thoroughly, paying special attention to the area where your surgery will be performed.  7. Thoroughly rinse your body with warm water from the neck down.  8. DO NOT shower/wash with your normal soap after using and rinsing off the CHG Soap.  9. Pat yourself dry with a CLEAN TOWEL.  10. Wear CLEAN PAJAMAS to bed the night before surgery, wear comfortable clothes the morning of surgery  11. Place CLEAN SHEETS on your bed the night of your first shower and DO NOT SLEEP WITH PETS.   Day of Surgery:  Do not apply any deodorants/lotions.  Please wear clean clothes to the hospital/surgery center.   Remember to brush your teeth WITH YOUR REGULAR TOOTHPASTE.   Contacts, dentures or bridgework may not be worn into surgery.  Leave your suitcase in the car.  After surgery it may be brought to your room.  For patients admitted to the hospital, discharge time will be determined by your treatment team.  Patients discharged the day of surgery will not be allowed to drive home.   Please read over the following fact sheets that you were given.

## 2018-08-21 NOTE — Telephone Encounter (Signed)
I returned his call and informed him that we had discussed rescheduling his surgery to January 30 back in November.  I asked him to call if this date is not a good date and we can reschedule it.  I also informed him that someone from the facility would call and give him his arrival time.  I asked him to call if he had any further questions or if he wanted to reschedule his date.

## 2018-08-22 NOTE — Progress Notes (Signed)
Anesthesia Chart Review:  Case:  [id number] Date/Time:  08/29/18 0700   Procedures:      SUB FIFTHE METATARSIA RIGHT FOOT (Right )     METATARSAL HEAD RESECTION (Right )   Anesthesia type:  General   Pre-op diagnosis:  ULCER   Location:  [facility] OR ROOM 08 / [place] [facility]   Surgeon:  Edrick Kins, DPM      DISCUSSION: Patient is a 48 year old male scheduled for the above procedure. Surgery was initially scheduled for 06/25/18, but was postponed to allow for cardiology re-evaluation.  Since then Mr. Fjeld was seen by Dr. [name] on 08/08/18 for follow-up and preoperative evaluation. See full note for details, but regarding Preoperative CV Evaluation, Dr. [name] wrote:  "He has significant CAD and low EF however functional capacity is quite good. Overall moderate risk for peri-operative CV complications but can proceed without further cardiac testing. If possible would avoid GA and do surgery under a block with conscious sedation, if possible."   History includes never smoker, CAD (non-obstructive 2012; diffuse 3V CAD "without real options for PCI or surgical revascularization" 02/01/87), chronic systolic CHF, non-ischemic cardiomyopathy (diagnosed 11/2010; has refused AICD in the past, told EP 09/2017 he would call when ready to schedule), cardiac injury from blunt trauma (reported a history of MI at age 65 while playing football), OSA (CPAP non-compliance), HTN, DM2, recurrent syncope thought to be vasovagal (Bidil discontinued), PVCs, CVA (old right cerebellar infarct 04/2017 MRI), headaches, Bipolar disorder, Schizophrenia, HLD. BMI is consistent with obesity/borderline morbid obesity.   Per Dr. Amalia Hailey, okay for patient to continue ASA and Plavix.  If no acute changes then I would anticipate that he can proceed as planned.   VS: BP (!) 114/48   Pulse 85   Temp (!) 36.4 C   Resp 20   Ht 5' 11.5" (1.816 m)   Wt 130.5 kg   SpO2 100%   BMI 39.58 kg/m   PROVIDERS: [name], [name], MD is PCP.  Last visit 06/14/18. She completed pre-operative H&P and recommended cardiology input as well. Glori Bickers, MD is cardiologist. Last visit 08/08/18. Patient will now consider ICD in the future. Fluid restriction of < 2L daily and Na < 2000 mg daily with daily weights reinforced.  Cristopher Peru, MD is EP cardiologist. Last visit 10/04/17. He discussed consideration of ICD. Patient said he would let Dr. Lovena Le know if he decides to proceed. Bobby Rumpf, MD is ID. Last visit 07/18/18.  He had been receiving IV vancomycin via left PICC (placed 06/14/18) for foot infection. Plans to switch to Augmentin once completion of IV antibiotics. (It looks like order to D/C PICC 07/25/18.) - Renato Shin, MD is endocrinologist. Last visit 08/20/18.     LABS: Labs reviewed: Acceptable for surgery. A1c 7.9 pm 08/20/18/ (all labs ordered are listed, but only abnormal results are displayed)  Labs Reviewed  GLUCOSE, CAPILLARY - Abnormal; Notable for the following components:      Result Value   Glucose-Capillary 175 (*)    All other components within normal limits  BASIC METABOLIC PANEL - Abnormal; Notable for the following components:   Glucose, Bld 170 (*)    BUN 21 (*)    Creatinine, Ser 1.28 (*)    All other components within normal limits  CBC - Abnormal; Notable for the following components:   WBC 11.4 (*)    All other components within normal limits    IMAGES: MRI/MRA head 05/06/18 (ED visit  for headaches, diplopia, possible cluster headache): IMPRESSION: 1. No acute intracranial abnormality. 2. Severe stenosis of the left internal carotid artery cavernous segment and distal left middle cerebral artery M1 segment. 3. Old right cerebellar infarct. (Comparison MRI/MRI 05/08/17 showed old right inferior cerebellar infarct, moderate stenosis in the left carotid siphon, narrowing of M1 segments, possible severe on left, bilateral vertebral stenoses, left worse than right, narrowing  irregularity of the proximal right PCA, possible superior cerebellar artery stenoses. Seen by neurologist Rosalin Hawking 05/08/17, MRA findings felt likely due to long standing uncontrolled DM, HTN, HLD, OSA, obesity. Recommend DAPT with ASA and Plavix.)   EKG: 03/04/18: SR, probable left atrial enlargement. Left BBB.    CV: Echo 08/08/18: Study Conclusions - Left ventricle: The cavity size was mildly dilated. There was   moderate concentric hypertrophy. Systolic function was severely   reduced. The estimated ejection fraction was in the range of 20%   to 25%. Diffuse hypokinesis. Features are consistent with a   pseudonormal left ventricular filling pattern, with concomitant   abnormal relaxation and increased filling pressure (grade 2   diastolic dysfunction). Doppler parameters are consistent with   elevated ventricular end-diastolic filling pressure. - Aortic valve: There was no regurgitation. - Left atrium: The atrium was mildly dilated. - Right ventricle: Systolic function was normal. - Right atrium: The atrium was normal in size. - Tricuspid valve: There was trivial regurgitation. - Pulmonary arteries: Systolic pressure was within the normal   range. - Inferior vena cava: The vessel was normal in size. - Pericardium, extracardiac: There was no pericardial effusion. Impressions: - When compared to th eprior study from 05/08/2017 there has been no   significant change. (Comparison EF 20-25%, normal wall motion, no regional wall motion abnormalities 05/08/17; EF 20-25%, diffuse hypokinesis 06/13/16; EF 20-25%, diffuse hypokinesis. Akinesis of the entire anteroseptal myocardium 11/24/13.)   RHC/LHC 09/26/17 (Dr. [name]):  Dist RCA lesion is 70% stenosed.  Ost RPDA to RPDA lesion is 85% stenosed.  Ost 1st Mrg lesion is 95% stenosed.  1st Mrg lesion is 95% stenosed.  Ost 2nd Mrg to 2nd Mrg lesion is 95% stenosed.  Prox LAD to Mid LAD lesion is 80% stenosed.  Dist LAD lesion  is 95% stenosed. Findings: RA = 8 RV = 43/10 PA = 43/16 (26) PCW = 11 Fick cardiac output/index = 6.4/2.6 PVR = 2.3 WU Ao sat = 91% PA sat = 64%, 65% Assessment: 1. Diffuse 3v CAD with diabetic vasculopathy. No clear targets for intervention 2. Ischemic CM EF 30-35% 3. Well-compensated hemodynamics Plan/Discussion: Cath films reviewed with interventional team. Agree that there are no real options for PCI or surgical revascularization.    Carotid U/S 05/08/17: Summary: - The vertebral arteries appear patent with antegrade flow. - Findings consistent with a 1-39 percent stenosis involving the right internal carotid artery and the left internal carotid artery.   Past Medical History:  Diagnosis Date  . Bipolar disorder ([place])   . CHF (congestive heart failure) ([place])   . Chronic systolic heart failure ([place]) 11/2010   a. NICM 4/14 EF 20-25%, TR mild  . CVA (cerebral infarction)    No residual deficits  . Depression    PTSD,   . Diabetes mellitus    TYPE II; UNCONTROLLED BY HEMOGLOBIN A1c; STABLE AS  PER DISCHARGE  . Headache(784.0)   . 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 History of colonic polyps   . Hyperlipidemia   . Hypertension   . Myocardial infarction ([place])  1987   (while playing football)  . Non-ischemic cardiomyopathy ([facility])    No ischemia on myoview, showed EF of 42%. 2D echo showed EF 70-26% with diatolic dysfunction in 3785  . Obesity   . OSA (obstructive sleep apnea)    Being set up again for c-pap. Pt sts he does not use a cpap, and the test was negative.  . Pneumonia   . Post-cardiac injury syndrome ([place])    History of cardiac injury from blunt trauma  . PVCs (premature ventricular contractions)   . Schizophrenia ([place])    Goes to [facility]  . SOB (shortness of breath)   . Stroke [facility]) 2005   some left side weakness  . Syncope    Recurrent, thought to be vasovagal. Also has h/o frequent PVCs.     Past Surgical History:   Procedure Laterality Date  . CARDIAC CATHETERIZATION  12/19/10   DIFFUSE NONOBSTRUCTIVE CAD; NONISCHEMIC CARDIOMYOPATHY; LEFT VENTRICULAR ANGIOGRAM WAS PERFORMED SECONDARY TO  ELEVATED LEFT VENTRICULAR FILLING PRESSURES  . COLONOSCOPY W/ POLYPECTOMY    . MULTIPLE EXTRACTIONS WITH ALVEOLOPLASTY  01/27/2014   "all my teeth; 4 Quadrants of alveoloplasty  . MULTIPLE EXTRACTIONS WITH ALVEOLOPLASTY N/A 01/27/2014   Procedure: EXTRACTION OF TEETH #'1, 2, 3, 4, 5, 6, 7, 8, 9, 10, 11, 12, 13, 14, 15, 16, 17, 20, 21, 22, 23, 24, 25, 26, 27, 28, 29, 31 and 32 WITH ALVEOLOPLASTY;  Surgeon: Lenn Cal, DDS;  Location: [facility];  Service: Oral Surgery;  Laterality: N/A;  . ORIF FINGER / THUMB FRACTURE Right   . RIGHT/LEFT HEART CATH AND CORONARY ANGIOGRAPHY N/A 09/26/2017   Procedure: RIGHT/LEFT HEART CATH AND CORONARY ANGIOGRAPHY;  Surgeon: Jolaine Artist, MD;  Location: [facility];  Service: Cardiovascular;  Laterality: N/A;    MEDICATIONS: . amoxicillin-clavulanate (AUGMENTIN) 500-125 MG tablet  . aspirin 81 MG EC tablet  . atorvastatin (LIPITOR) 40 MG tablet  . Blood Glucose Monitoring Suppl (ACCU-CHEK GUIDE ME) w/Device KIT  . carvedilol (COREG) 6.25 MG tablet  . cetirizine (ZYRTEC ALLERGY) 10 MG tablet  . clopidogrel (PLAVIX) 75 MG tablet  . digoxin (LANOXIN) 0.125 MG tablet  . fluticasone (FLONASE) 50 MCG/ACT nasal spray  . furosemide (LASIX) 40 MG tablet  . glucose blood (ACCU-CHEK GUIDE) test strip  . Insulin Glargine (BASAGLAR K[place]IKPEN) 100 UNIT/ML SOPN  . Lancets 28G MISC  . Lancets Misc. (ACCU-CHEK FASTCLIX LANCET) KIT  . Potassium Chloride ER 20 MEQ TBCR  . sacubitril-valsartan (ENTRESTO) 97-103 MG   No current facility-administered medications for this encounter.     Myra Gianotti, PA-C Surgical Short Stay/Anesthesiology [facility] Phone [phone] [facility] Phone [phone] 08/22/2018 4:52 PM

## 2018-08-22 NOTE — Anesthesia Preprocedure Evaluation (Addendum)
Anesthesia Evaluation  Patient identified by MRN, date of birth, ID band Patient awake    Reviewed: Allergy & Precautions, NPO status , Patient'[name] Chart, lab work & pertinent test results  Airway Mallampati: II       Dental  (+) Edentulous Upper, Edentulous Lower   Pulmonary sleep apnea ,    breath sounds clear to auscultation       Cardiovascular hypertension, Pt. on home beta blockers +CHF   Rhythm:Regular Rate:Normal + Systolic murmurs    Neuro/Psych    GI/Hepatic   Endo/Other  diabetes, Insulin Dependent  Renal/GU      Musculoskeletal   Abdominal (+) + obese,   Peds  Hematology   Anesthesia Other Findings ECHO COMPLETE WITH IMAGING ENHANCING AGENT  Order# [id number]  Reading physician: Mitchell MassonNelson, Mitchell [name], [name] Ordering physician: Mitchell PattyBensimhon, Mitchell [name], [name] Study date: 08/08/18 ECHOCARDIOGRAM COMPLETE (Accession [id number]) (Order [id number])  Echocardiography  Date: 08/08/2018 [facility] Released By: Mitchell [name] Authorizing: Mitchell PattyBensimhon, Mitchell [name], [name] Linked Results   Procedure Abnormality Status ECHOCARDIOGRAM COMPLETE   ECHOCARDIOGRAM COMPLETE  Order #: [id number] Accession #: [id number]  Patient Info   Patient name: Mitchell [name]  MRN: [id number]  Age: 48 y.o.  Sex: male Vitals   BP Height Weight BSA (Calculated - sq m) 121/96 5\' 11"  (1.803 m) 127.9 kg 2.53 sq meters Study Result   Result status: Final result                             *[place]*                   *[facility]*                         [address]                        [place], [place] [address]                            [phone]  ------------------------------------------------------------------- Transthoracic Echocardiography  Patient:    Mitchell [name], Mitchell [name] MR #:       [id number] Study Date: 08/08/2018 Gender:     M Age:        48 Height:     180.3 cm Weight:      127.9 kg BSA:        2.58 m^2 Pt. Status: Room:   ATTENDING    Default, Provider [phone]  REFERRING    Mitchell [name]  ORDERING     Bensimhon, Mitchell  REFERRING    Bensimhon, Mitchell [name]  PERFORMING   [facility], Outpatient  SONOGRAPHER  [place]al[place]  cc:  ------------------------------------------------------------------- LV EF: 20% -   25%  ------------------------------------------------------------------- Indications:      CHF - 428.0.  ------------------------------------------------------------------- History:   PMH:   Syncope.  Stroke.  PMH:   Myocardial infarction. Risk factors:  Hypertension. Diabetes mellitus. Morbidly obese. Dyslipidemia.  ------------------------------------------------------------------- Study Conclusions  - Left ventricle: The cavity size was mildly dilated. There was   moderate concentric hypertrophy. Systolic function was severely   reduced. The estimated ejection fraction was in the range of 20%   to 25%. Diffuse hypokinesis. Features are consistent with a   pseudonormal left ventricular filling pattern, with concomitant   abnormal relaxation and  increased filling pressure (grade 2   diastolic dysfunction). Doppler parameters are consistent with   elevated ventricular end-diastolic filling pressure. - Aortic valve: There was no regurgitation. - Left atrium: The atrium was mildly dilated. - Right ventricle: Systolic function was normal. - Right atrium: The atrium was normal in size. - Tricuspid valve: There was trivial regurgitation. - Pulmonary arteries: Systolic pressure was within the normal   range. - Inferior vena cava: The vessel was normal in size. - Pericardium, extracardiac: There was no pericardial effusion.  Impressions:  Mitchell [name]  CARDIAC CATHETERIZATION  Order# [id number]  Reading physician: Mitchell Patty, [name] Ordering physician: Mitchell Patty, [name] Study date: 09/26/17 Physicians   Panel  Physicians Referring Physician Case Authorizing Physician Bensimhon, Mitchell [name] (Primary)   Procedures   RIGHT/LEFT HEART CATH AND CORONARY ANGIOGRAPHY Conclusion     Dist RCA lesion is 70% stenosed.  Ost RPDA to RPDA lesion is 85% stenosed.  Ost 1st Mrg lesion is 95% stenosed.  1st Mrg lesion is 95% stenosed.  Ost 2nd Mrg to 2nd Mrg lesion is 95% stenosed.  Prox LAD to Mid LAD lesion is 80% stenosed.  Dist LAD lesion is 95% stenosed.   Findings:  RA = 8 RV = 43/10 PA = 43/16 (26) PCW = 11 Fick cardiac output/index = 6.4/2.6 PVR = 2.3 WU Ao sat = 91% PA sat = 64%, 65%  Assessment: 1. Diffuse 3v CAD with diabetic vasculopathy. No clear targets for intervention 2. Ischemic CM EF 30-35% 3. Well-compensated hemodynamics  Plan/Discussion:  Cath films reviewed with interventional team. Agree that there are no real options for PCI or surgical revascularization.   Mitchell Meres, [name]  12:26 PM     Reproductive/Obstetrics                         Anesthesia Physical Anesthesia Plan  ASA: IV  Anesthesia Plan: General   Post-op Pain Management:    Induction:   PONV Risk Score and Plan: Ondansetron and Midazolam  Airway Management Planned: LMA  Additional Equipment:   Intra-op Plan:   Post-operative Plan: Extubation in OR  Informed Consent: I have reviewed the patients History and Physical, chart, labs and discussed the procedure including the risks, benefits and alternatives for the proposed anesthesia with the patient or authorized representative who has indicated his/her understanding and acceptance.     Dental advisory given  Plan Discussed with: CRNA  Anesthesia Plan Comments: (PAT note written 08/22/2018 by Shonna Chock, PA-[name]. )    Anesthesia Quick Evaluation

## 2018-08-28 ENCOUNTER — Encounter (HOSPITAL_COMMUNITY): Payer: Self-pay | Admitting: Anesthesiology

## 2018-08-29 ENCOUNTER — Other Ambulatory Visit: Payer: Self-pay

## 2018-08-29 ENCOUNTER — Encounter (HOSPITAL_COMMUNITY)
Admission: RE | Disposition: A | Discharge status: Home / Self Care | Payer: Self-pay | Source: Home / Self Care | Attending: Podiatry

## 2018-08-29 ENCOUNTER — Ambulatory Visit (HOSPITAL_COMMUNITY)
Admission: RE | Admit: 2018-08-29 | Discharge: 2018-08-29 | Disposition: A | Discharge status: Home / Self Care | Payer: No Typology Code available for payment source | Attending: Podiatry | Admitting: Podiatry

## 2018-08-29 ENCOUNTER — Ambulatory Visit (HOSPITAL_COMMUNITY): Payer: No Typology Code available for payment source | Admitting: Physician Assistant

## 2018-08-29 ENCOUNTER — Encounter (HOSPITAL_COMMUNITY): Payer: Self-pay

## 2018-08-29 ENCOUNTER — Encounter: Payer: Self-pay | Admitting: Podiatry

## 2018-08-29 ENCOUNTER — Ambulatory Visit (HOSPITAL_COMMUNITY): Payer: No Typology Code available for payment source | Admitting: Anesthesiology

## 2018-08-29 DIAGNOSIS — Z6839 Body mass index (BMI) 39.0-39.9, adult: Secondary | ICD-10-CM | POA: Diagnosis not present

## 2018-08-29 DIAGNOSIS — Z7982 Long term (current) use of aspirin: Secondary | ICD-10-CM | POA: Diagnosis not present

## 2018-08-29 DIAGNOSIS — E785 Hyperlipidemia, unspecified: Secondary | ICD-10-CM | POA: Insufficient documentation

## 2018-08-29 DIAGNOSIS — E669 Obesity, unspecified: Secondary | ICD-10-CM | POA: Diagnosis not present

## 2018-08-29 DIAGNOSIS — I5022 Chronic systolic (congestive) heart failure: Secondary | ICD-10-CM | POA: Diagnosis not present

## 2018-08-29 DIAGNOSIS — Z794 Long term (current) use of insulin: Secondary | ICD-10-CM | POA: Insufficient documentation

## 2018-08-29 DIAGNOSIS — F209 Schizophrenia, unspecified: Secondary | ICD-10-CM | POA: Diagnosis not present

## 2018-08-29 DIAGNOSIS — I110 Hypertensive heart disease with heart failure: Secondary | ICD-10-CM | POA: Insufficient documentation

## 2018-08-29 DIAGNOSIS — G4733 Obstructive sleep apnea (adult) (pediatric): Secondary | ICD-10-CM | POA: Diagnosis not present

## 2018-08-29 DIAGNOSIS — Z7902 Long term (current) use of antithrombotics/antiplatelets: Secondary | ICD-10-CM | POA: Diagnosis not present

## 2018-08-29 DIAGNOSIS — I252 Old myocardial infarction: Secondary | ICD-10-CM | POA: Diagnosis not present

## 2018-08-29 DIAGNOSIS — E1165 Type 2 diabetes mellitus with hyperglycemia: Secondary | ICD-10-CM | POA: Diagnosis not present

## 2018-08-29 DIAGNOSIS — Z79899 Other long term (current) drug therapy: Secondary | ICD-10-CM | POA: Diagnosis not present

## 2018-08-29 DIAGNOSIS — I2510 Atherosclerotic heart disease of native coronary artery without angina pectoris: Secondary | ICD-10-CM | POA: Insufficient documentation

## 2018-08-29 DIAGNOSIS — Z8673 Personal history of transient ischemic attack (TIA), and cerebral infarction without residual deficits: Secondary | ICD-10-CM | POA: Diagnosis not present

## 2018-08-29 DIAGNOSIS — E1169 Type 2 diabetes mellitus with other specified complication: Secondary | ICD-10-CM | POA: Insufficient documentation

## 2018-08-29 DIAGNOSIS — E11621 Type 2 diabetes mellitus with foot ulcer: Secondary | ICD-10-CM | POA: Diagnosis not present

## 2018-08-29 DIAGNOSIS — L97512 Non-pressure chronic ulcer of other part of right foot with fat layer exposed: Secondary | ICD-10-CM

## 2018-08-29 DIAGNOSIS — Z7951 Long term (current) use of inhaled steroids: Secondary | ICD-10-CM | POA: Diagnosis not present

## 2018-08-29 DIAGNOSIS — M869 Osteomyelitis, unspecified: Secondary | ICD-10-CM | POA: Diagnosis not present

## 2018-08-29 DIAGNOSIS — L97519 Non-pressure chronic ulcer of other part of right foot with unspecified severity: Secondary | ICD-10-CM | POA: Insufficient documentation

## 2018-08-29 DIAGNOSIS — M21541 Acquired clubfoot, right foot: Secondary | ICD-10-CM

## 2018-08-29 HISTORY — PX: METATARSAL HEAD EXCISION: SHX5027

## 2018-08-29 HISTORY — PX: METATARSAL OSTEOTOMY: SHX1641

## 2018-08-29 HISTORY — PX: WOUND DEBRIDEMENT: SHX247

## 2018-08-29 LAB — GLUCOSE, CAPILLARY
GLUCOSE-CAPILLARY: 111 mg/dL — AB (ref 70–99)
Glucose-Capillary: 123 mg/dL — ABNORMAL HIGH (ref 70–99)

## 2018-08-29 SURGERY — OSTEOTOMY, METATARSAL BONE
Anesthesia: General | Site: Foot | Laterality: Right

## 2018-08-29 MED ORDER — PROPOFOL 10 MG/ML IV BOLUS
INTRAVENOUS | Status: AC
  Filled 2018-08-29: qty 20

## 2018-08-29 MED ORDER — 0.9 % SODIUM CHLORIDE (POUR BTL) OPTIME
TOPICAL | Status: DC | PRN
  Administered 2018-08-29: 1000 mL

## 2018-08-29 MED ORDER — CEFAZOLIN SODIUM 1 G IJ SOLR
INTRAMUSCULAR | Status: AC
  Filled 2018-08-29: qty 20

## 2018-08-29 MED ORDER — LIDOCAINE 2% (20 MG/ML) 5 ML SYRINGE
INTRAMUSCULAR | Status: DC | PRN
  Administered 2018-08-29: 100 mg via INTRAVENOUS

## 2018-08-29 MED ORDER — LACTATED RINGERS IV SOLN
INTRAVENOUS | Status: DC | PRN
  Administered 2018-08-29: 07:00:00 via INTRAVENOUS

## 2018-08-29 MED ORDER — CHLORHEXIDINE GLUCONATE 4 % EX LIQD: 60.0000 mL | Freq: Once | CUTANEOUS | Status: DC

## 2018-08-29 MED ORDER — PROPOFOL 10 MG/ML IV BOLUS
INTRAVENOUS | Status: DC | PRN
  Administered 2018-08-29: 200 mg via INTRAVENOUS

## 2018-08-29 MED ORDER — MIDAZOLAM HCL 2 MG/2ML IJ SOLN
INTRAMUSCULAR | Status: DC | PRN
  Administered 2018-08-29: 2 mg via INTRAVENOUS

## 2018-08-29 MED ORDER — DEXTROSE 5 % IV SOLN
3.0000 g | INTRAVENOUS | Status: AC
  Administered 2018-08-29: 3 g via INTRAVENOUS
  Filled 2018-08-29: qty 3

## 2018-08-29 MED ORDER — LIDOCAINE HCL 2 % IJ SOLN
INTRAMUSCULAR | Status: AC
  Filled 2018-08-29: qty 20

## 2018-08-29 MED ORDER — PHENYLEPHRINE 40 MCG/ML (10ML) SYRINGE FOR IV PUSH (FOR BLOOD PRESSURE SUPPORT)
PREFILLED_SYRINGE | INTRAVENOUS | Status: DC | PRN
  Administered 2018-08-29: 200 ug via INTRAVENOUS

## 2018-08-29 MED ORDER — MIDAZOLAM HCL 2 MG/2ML IJ SOLN
INTRAMUSCULAR | Status: AC
  Filled 2018-08-29: qty 2

## 2018-08-29 MED ORDER — FENTANYL CITRATE (PF) 250 MCG/5ML IJ SOLN
INTRAMUSCULAR | Status: AC
  Filled 2018-08-29: qty 5

## 2018-08-29 MED ORDER — ONDANSETRON HCL 4 MG/2ML IJ SOLN
INTRAMUSCULAR | Status: AC
  Filled 2018-08-29: qty 2

## 2018-08-29 MED ORDER — OXYCODONE-ACETAMINOPHEN 5-325 MG PO TABS: 1.0000 | ORAL_TABLET | Freq: Four times a day (QID) | ORAL | 0 refills | Status: DC | PRN

## 2018-08-29 MED ORDER — SODIUM CHLORIDE 0.9 % IV SOLN
INTRAVENOUS | Status: DC | PRN
  Administered 2018-08-29: 50 ug/min via INTRAVENOUS

## 2018-08-29 MED ORDER — PHENYLEPHRINE 40 MCG/ML (10ML) SYRINGE FOR IV PUSH (FOR BLOOD PRESSURE SUPPORT)
PREFILLED_SYRINGE | INTRAVENOUS | Status: AC
  Filled 2018-08-29: qty 10

## 2018-08-29 MED ORDER — ONDANSETRON HCL 4 MG/2ML IJ SOLN
INTRAMUSCULAR | Status: DC | PRN
  Administered 2018-08-29: 4 mg via INTRAVENOUS

## 2018-08-29 MED ORDER — FENTANYL CITRATE (PF) 250 MCG/5ML IJ SOLN
INTRAMUSCULAR | Status: DC | PRN
  Administered 2018-08-29: 50 ug via INTRAVENOUS

## 2018-08-29 MED ORDER — BUPIVACAINE HCL (PF) 0.5 % IJ SOLN
INTRAMUSCULAR | Status: AC
  Filled 2018-08-29: qty 30

## 2018-08-29 MED ORDER — BUPIVACAINE HCL (PF) 0.5 % IJ SOLN
INTRAMUSCULAR | Status: DC | PRN
  Administered 2018-08-29: 10 mL

## 2018-08-29 MED ORDER — LIDOCAINE 2% (20 MG/ML) 5 ML SYRINGE
INTRAMUSCULAR | Status: AC
  Filled 2018-08-29: qty 5

## 2018-08-29 SURGICAL SUPPLY — 45 items
BANDAGE ACE 4X5 VEL STRL LF (GAUZE/BANDAGES/DRESSINGS) ×6 IMPLANT
BANDAGE ELASTIC 4 VELCRO ST LF (GAUZE/BANDAGES/DRESSINGS) ×3 IMPLANT
BLADE OSCILLATING/SAGITTAL (BLADE) ×1
BLADE SURG 15 STRL LF DISP TIS (BLADE) ×4 IMPLANT
BLADE SURG 15 STRL SS (BLADE) ×2
BLADE SW THK.38XMED NAR THN (BLADE) ×2 IMPLANT
BNDG CONFORM 3 STRL LF (GAUZE/BANDAGES/DRESSINGS) ×3 IMPLANT
BNDG ESMARK 4X9 LF (GAUZE/BANDAGES/DRESSINGS) ×3 IMPLANT
BNDG GAUZE ELAST 4 BULKY (GAUZE/BANDAGES/DRESSINGS) ×3 IMPLANT
COVER WAND RF STERILE (DRAPES) ×3 IMPLANT
CUFF TOURNIQUET SINGLE 18IN (TOURNIQUET CUFF) IMPLANT
CUFF TOURNIQUET SINGLE 24IN (TOURNIQUET CUFF) IMPLANT
DRAPE EXTREMITY T 121X128X90 (DISPOSABLE) ×3 IMPLANT
DRAPE OEC MINIVIEW 54X84 (DRAPES) ×3 IMPLANT
DRAPE U-SHAPE 47X51 STRL (DRAPES) ×3 IMPLANT
ELECT REM PT RETURN 9FT ADLT (ELECTROSURGICAL) ×3
ELECTRODE REM PT RTRN 9FT ADLT (ELECTROSURGICAL) ×2 IMPLANT
GAUZE SPONGE 4X4 12PLY STRL (GAUZE/BANDAGES/DRESSINGS) ×3 IMPLANT
GAUZE XEROFORM 1X8 LF (GAUZE/BANDAGES/DRESSINGS) ×3 IMPLANT
GLOVE BIO SURGEON STRL SZ8 (GLOVE) IMPLANT
GLOVE BIOGEL PI IND STRL 8 (GLOVE) IMPLANT
GLOVE BIOGEL PI INDICATOR 8 (GLOVE)
GOWN STRL REUS W/ TWL LRG LVL3 (GOWN DISPOSABLE) ×2 IMPLANT
GOWN STRL REUS W/ TWL XL LVL3 (GOWN DISPOSABLE) ×2 IMPLANT
GOWN STRL REUS W/TWL LRG LVL3 (GOWN DISPOSABLE) ×1
GOWN STRL REUS W/TWL XL LVL3 (GOWN DISPOSABLE) ×1
KIT BASIN OR (CUSTOM PROCEDURE TRAY) ×3 IMPLANT
NDL SAFETY ECLIPSE 18X1.5 (NEEDLE) IMPLANT
NEEDLE HYPO 18GX1.5 SHARP (NEEDLE)
NEEDLE HYPO 25X1 1.5 SAFETY (NEEDLE) ×3 IMPLANT
NS IRRIG 1000ML POUR BTL (IV SOLUTION) IMPLANT
PACK ORTHO EXTREMITY (CUSTOM PROCEDURE TRAY) ×3 IMPLANT
PADDING CAST ABS 4INX4YD NS (CAST SUPPLIES) ×2
PADDING CAST ABS COTTON 4X4 ST (CAST SUPPLIES) ×4 IMPLANT
SCRUB BETADINE 4OZ XXX (MISCELLANEOUS) ×6 IMPLANT
SPLINT FIBERGLASS 4X30 (CAST SUPPLIES) ×3 IMPLANT
STAPLER VISISTAT (STAPLE) IMPLANT
SUT PROLENE 4 0 PS 2 18 (SUTURE) ×3 IMPLANT
SUT VICRYL 4-0 PS2 18IN ABS (SUTURE) IMPLANT
SWAB COLLECTION DEVICE MRSA (MISCELLANEOUS) ×3 IMPLANT
SWAB CULTURE ESWAB REG 1ML (MISCELLANEOUS) ×3 IMPLANT
SYR BULB 3OZ (MISCELLANEOUS) ×3 IMPLANT
SYR CONTROL 10ML LL (SYRINGE) IMPLANT
TUBING BULK SUCTION (MISCELLANEOUS) ×3 IMPLANT
UNDERPAD 30X30 (UNDERPADS AND DIAPERS) ×3 IMPLANT

## 2018-08-29 NOTE — Discharge Instructions (Signed)

## 2018-08-29 NOTE — Transfer of Care (Signed)
Immediate Anesthesia Transfer of Care Note  Patient: Mitchell [name]  Procedure(s) Performed: SUB FIFTHE METATARSIA RIGHT FOOT (Right Fifth Toe) METATARSAL HEAD RESECTION (Right Fifth Toe) Debridement of ulcer on right fifth metatarsal (Right Foot)  Patient Location: PACU  Anesthesia Type:General  Level of Consciousness: drowsy and patient cooperative  Airway & Oxygen Therapy: Patient Spontanous Breathing  Post-op Assessment: Report given to RN  Post vital signs: Reviewed and stable  Last Vitals:  Vitals Value Taken Time  BP    Temp    Pulse    Resp    SpO2      Last Pain:  Vitals:   08/29/18 0625  TempSrc:   PainSc: 0-No pain      Patients Stated Pain Goal: 3 (08/29/18 1281)  Complications: No apparent anesthesia complications

## 2018-08-29 NOTE — Anesthesia Postprocedure Evaluation (Signed)
Anesthesia Post Note  Patient: Mitchell [name]  Procedure(s) Performed: SUB FIFTHE METATARSIA RIGHT FOOT (Right Fifth Toe) METATARSAL HEAD RESECTION (Right Fifth Toe) Debridement of ulcer on right fifth metatarsal (Right Foot)     Patient location during evaluation: PACU Anesthesia Type: General Level of consciousness: awake Pain management: pain level controlled Vital Signs Assessment: post-procedure vital signs reviewed and stable Respiratory status: spontaneous breathing Cardiovascular status: stable Postop Assessment: no apparent nausea or vomiting Anesthetic complications: no    Last Vitals:  Vitals:   08/29/18 0829 08/29/18 0846  BP: 117/82 134/76  Pulse: 71 72  Resp: 14 16  Temp:    SpO2: 99% 99%    Last Pain:  Vitals:   08/29/18 0846  TempSrc:   PainSc: 0-No pain   Pain Goal: Patients Stated Pain Goal: 3 (08/29/18 0625)                 Caren Macadam

## 2018-08-29 NOTE — Progress Notes (Signed)
Orthopedic Tech Progress Note Patient Details:  Mitchell [name] Oct 12, 1970 [id number] PACU RN called asking for this shoe  Ortho Devices Type of Ortho Device: Postop shoe/boot Ortho Device/Splint Location: LRE Ortho Device/Splint Interventions: Adjustment, Application, Ordered   Post Interventions Instructions Provided: Care of device, Adjustment of device   [name] 08/29/2018, 9:00 AM

## 2018-08-29 NOTE — Brief Op Note (Signed)
08/29/2018  8:08 AM  PATIENT:  Mitchell [name]  48 y.o. male  PRE-OPERATIVE DIAGNOSIS:  ULCER  POST-OPERATIVE DIAGNOSIS:  ULCER  PROCEDURE:  Procedure(s): SUB FIFTHE METATARSIA RIGHT FOOT (Right) METATARSAL HEAD RESECTION (Right) Debridement of ulcer on right fifth metatarsal (Right)  SURGEON:  Surgeon(s) and Role:    Felecia Shelling, DPM - Primary  PHYSICIAN ASSISTANT:   ASSISTANTS: none   ANESTHESIA:   local and general  EBL:  minimal   BLOOD ADMINISTERED:none  DRAINS: none   LOCAL MEDICATIONS USED:  MARCAINE     SPECIMEN:  No Specimen  DISPOSITION OF SPECIMEN:  N/A  COUNTS:  YES  TOURNIQUET:   Total Tourniquet Time Documented: Calf (Right) - 15 minutes Total: Calf (Right) - 15 minutes   DICTATION: .Reubin Milan Dictation  PLAN OF CARE: Discharge to home after PACU  PATIENT DISPOSITION:  PACU - hemodynamically stable.   Delay start of Pharmacological VTE agent (>24hrs) due to surgical blood loss or risk of bleeding: not applicable

## 2018-08-29 NOTE — Anesthesia Procedure Notes (Signed)
Procedure Name: LMA Insertion Date/Time: 08/29/2018 7:33 AM Performed by: De Nurse, CRNA Pre-anesthesia Checklist: Patient identified, Emergency Drugs available, Suction available and Patient being monitored Patient Re-evaluated:Patient Re-evaluated prior to induction Oxygen Delivery Method: Circle System Utilized Preoxygenation: Pre-oxygenation with 100% oxygen Induction Type: IV induction LMA: LMA inserted LMA Size: 5.0 Number of attempts: 1 Placement Confirmation: positive ETCO2 Tube secured with: Tape Dental Injury: Teeth and Oropharynx as per pre-operative assessment

## 2018-08-29 NOTE — Interval H&P Note (Signed)
History and Physical Interval Note:  08/29/2018 7:33 AM  Mitchell [name]  has presented today for surgery, with the diagnosis of ULCER  The various methods of treatment have been discussed with the patient and family. After consideration of risks, benefits and other options for treatment, the patient has consented to  Procedure(s): SUB FIFTHE METATARSIA RIGHT FOOT (Right) METATARSAL HEAD RESECTION (Right) as a surgical intervention .  The patient's history has been reviewed, patient examined, no change in status, stable for surgery.  I have reviewed the patient's chart and labs.  Questions were answered to the patient's satisfaction.     Felecia Shelling

## 2018-08-30 ENCOUNTER — Encounter (HOSPITAL_COMMUNITY): Payer: Self-pay | Admitting: Podiatry

## 2018-09-03 LAB — AEROBIC/ANAEROBIC CULTURE W GRAM STAIN (SURGICAL/DEEP WOUND): Culture: NO GROWTH

## 2018-09-04 ENCOUNTER — Ambulatory Visit (INDEPENDENT_AMBULATORY_CARE_PROVIDER_SITE_OTHER): Payer: No Typology Code available for payment source

## 2018-09-04 ENCOUNTER — Ambulatory Visit (INDEPENDENT_AMBULATORY_CARE_PROVIDER_SITE_OTHER): Payer: No Typology Code available for payment source | Admitting: Podiatry

## 2018-09-04 DIAGNOSIS — L97512 Non-pressure chronic ulcer of other part of right foot with fat layer exposed: Secondary | ICD-10-CM

## 2018-09-04 DIAGNOSIS — Z9889 Other specified postprocedural states: Secondary | ICD-10-CM

## 2018-09-04 DIAGNOSIS — Z09 Encounter for follow-up examination after completed treatment for conditions other than malignant neoplasm: Secondary | ICD-10-CM | POA: Diagnosis not present

## 2018-09-06 ENCOUNTER — Encounter: Payer: Self-pay | Admitting: Infectious Diseases

## 2018-09-06 ENCOUNTER — Ambulatory Visit (INDEPENDENT_AMBULATORY_CARE_PROVIDER_SITE_OTHER): Payer: No Typology Code available for payment source | Admitting: Infectious Diseases

## 2018-09-06 DIAGNOSIS — L97512 Non-pressure chronic ulcer of other part of right foot with fat layer exposed: Secondary | ICD-10-CM | POA: Diagnosis not present

## 2018-09-06 DIAGNOSIS — Z86010 Personal history of colonic polyps: Secondary | ICD-10-CM | POA: Diagnosis not present

## 2018-09-06 DIAGNOSIS — E118 Type 2 diabetes mellitus with unspecified complications: Secondary | ICD-10-CM | POA: Diagnosis not present

## 2018-09-06 NOTE — Assessment & Plan Note (Signed)
Will send him for colon, has been ? 10 years and with hx of polyps some concern.

## 2018-09-06 NOTE — Progress Notes (Signed)
   Subjective:    Patient ID: Mitchell [name], male    DOB: April 28, 1971, 48 y.o.   MRN: [id number]  HPI 48 yo M with hx of DM2 requiring insulin (since 9390), complicated by CVA (3009), CHF, and mental illness (schizophrenia or bipolar unclear). He was seen in ID 06-13-18 for MRI showing osteo of 5th metatarsal head, and was started on vanco/ceftriaxone via PIC. End date of anbx 07-25-18.  He was seen in ID on 12-19 (slight elevation of ESR at 37, normal CRP). He was transitioned to po augmentin at end of IV rx.  He underwent resection of R 5th MT head and ulcer on 08-29-18. He completed his po rx today.  He has been feeling well. His wound is healing well. No drainage. No fever or chills.  His FSG was 124 yesterday, 118 day prior. 116 prior.   Doesn't want flu shot.  Had ophtho last month.  Has had colonoscopy. Had polyps, was > 10 years ago.   Review of Systems     Objective:   Physical Exam Constitutional:      Appearance: He is obese.  HENT:     Mouth/Throat:     Pharynx: No oropharyngeal exudate.  Eyes:     Extraocular Movements: Extraocular movements intact.     Pupils: Pupils are equal, round, and reactive to light.  Cardiovascular:     Rate and Rhythm: Normal rate and regular rhythm.  Pulmonary:     Effort: Pulmonary effort is normal.     Breath sounds: Normal breath sounds.  Abdominal:     General: Bowel sounds are normal. There is no distension.     Palpations: Abdomen is soft.     Tenderness: There is no abdominal tenderness.  Musculoskeletal:     Right lower leg: No edema.     Left lower leg: No edema.       Feet:  Neurological:     Mental Status: He is alert.           Assessment & Plan:

## 2018-09-06 NOTE — Assessment & Plan Note (Signed)
He is doing very well.  Will stop anbx.  Will f/u with Dr Logan Bores for removal of staples.  Will see him back prn.

## 2018-09-06 NOTE — Assessment & Plan Note (Addendum)
Will f/u with Dr Everardo All, needs Ur microalb, lipids.  By his report, Glc well controlled.

## 2018-09-08 NOTE — Op Note (Signed)
OPERATIVE REPORT Patient name: Mitchell [name] MRN: [id number] DOB: 26-Jan-1971  DOS:  08/29/2018  Preop Dx: Osteomyelitis fifth metatarsal head right foot.  Ulcer sub-fifth MTPJ right foot Postop Dx: same  Procedure:  1.  Fifth metatarsal head resection right foot 2.  Excisional debridement of ulcer right foot, including muscle and deep fascia  Surgeon: Felecia Shelling DPM  Anesthesia: 50-50 mixture of 2% lidocaine plain with 0.5% Marcaine plain totaling 20 cc infiltrated in the patient's right lower extremity  Hemostasis: Ankle tourniquet inflated to a pressure of after esmarch exsanguination   EBL: Minimal mL Materials: None Injectables: None Pathology: None  Condition: The patient tolerated the procedure and anesthesia well. No complications noted or reported   Justification for procedure: The patient is a 48 y.o. male who presents today for surgical correction of osteomyelitis of the fifth metatarsal head right foot as well as an underlying ulceration right foot. All conservative modalities of been unsuccessful in providing any sort of satisfactory alleviation of symptoms with the patient. The patient was told benefits as well as possible side effects of the surgery. The patient consented for surgical correction. The patient consent form was reviewed. All patient questions were answered. No guarantees were expressed or implied. The patient and the surgeon boson the patient consent form with the witness present and placed in the patient's chart.   Procedure in Detail: The patient was brought to the operating room, placed in the operating table in the supine position at which time an aseptic scrub and drape were performed about the patient's respective lower extremity after anesthesia was induced as described above. Attention was then directed to the surgical area where procedure number one commenced  Procedure #1: fifth metatarsal head resection right foot A 3 cm linear  longitudinal skin incision was planned and made overlying the fifth metatarsal head of the right foot.  Incision was carried down to the level of EDL tendon at which time a tenotomy was performed.  Additional dissection achieved down to the level of the distal one half of the fifth metatarsal.  Surgical #15 blade was then utilized to free the soft tissue surrounding the fifth metatarsal head and reflected away from the osseous structure in preparation for the ensuing osteotomy.  Osteotomy was performed at the metaphyseal neck of the fifth metatarsal.  The distal fragment of the fifth metatarsal head was removed in toto.  Intraoperative x-ray frost was utilized to visualize the removal of the fifth metatarsal head which was satisfactory.  Copious irrigation was then utilized in preparation for routine layered soft tissue closure.  4-0 Vicryl suture was utilized to reapproximate subcutaneous tissue followed by stainless steel skin staples to reapproximate superficial skin edges.  Procedure #2: Excisional debridement of ulcer right foot, including muscle and deep fascial tissue Medically necessary excisional debridement including muscle and deep fascial tissue was performed using a surgical #15 blade.  Excisional debridement of all the necrotic nonviable tissue down to healthy bleeding viable tissue was performed with post debridement measurement same as pre-.  Wound measures approximately 1.5 x 1.5 x 0.4 cm.  Dry sterile compressive dressings were then applied to all previously mentioned incision sites about the patient's lower extremity. The tourniquet which was used for hemostasis was deflated. All normal neurovascular responses including pink color and warmth returned all the digits of patient's lower extremity.  The patient was then transferred from the operating room to the recovery room having tolerated the procedure and anesthesia well. All vital  signs are stable. After a brief stay in the recovery room  the patient was discharged with adequate prescriptions for analgesia. Verbal as well as written instructions were provided for the patient regarding wound care. The patient is to keep the dressings clean dry and intact until they are to follow surgeon Dr. Gala Lewandowsky in the office upon discharge.   Felecia Shelling, DPM [facility]  Dr. Felecia Shelling, DPM    [address]                                        [place], [place] [address]                Office [phone]  Fax [phone]

## 2018-09-08 NOTE — Progress Notes (Signed)
   Subjective:  Patient presents today status post 5th metatarsal head resection right. DOS: 08/29/2018. He states he is doing well. He denies any significant pain or modifying factors. He has been using the CAM boot as directed. Patient is here for further evaluation and treatment.    Past Medical History:  Diagnosis Date  . Bipolar disorder (HCC)   . CHF (congestive heart failure) (HCC)   . Chronic systolic heart failure (HCC) 11/2010   a. NICM 4/14 EF 20-25%, TR mild  . CVA (cerebral infarction)    No residual deficits  . Depression    PTSD,   . Diabetes mellitus    TYPE II; UNCONTROLLED BY HEMOGLOBIN A1c; STABLE AS  PER DISCHARGE  . Headache(784.0)   . 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 History of colonic polyps   . Hyperlipidemia   . Hypertension   . Myocardial infarction (HCC) 1987   (while playing football)  . Non-ischemic cardiomyopathy (HCC)    No ischemia on myoview, showed EF of 42%. 2D echo showed EF 50-55% with diatolic dysfunction in 2009  . Obesity   . OSA (obstructive sleep apnea)    Being set up again for c-pap. Pt sts he does not use a cpap, and the test was negative.  . Pneumonia   . Post-cardiac injury syndrome (HCC)    History of cardiac injury from blunt trauma  . PVCs (premature ventricular contractions)   . Schizophrenia (HCC)    Goes to [facility]  . SOB (shortness of breath)   . Stroke [facility]) 2005   some left side weakness  . Syncope    Recurrent, thought to be vasovagal. Also has h/o frequent PVCs.       Objective/Physical Exam Neurovascular status intact.  Skin incisions appear to be well coapted with sutures and staples intact. No sign of infectious process noted. No dehiscence. No active bleeding noted. Moderate edema noted to the surgical extremity.  Radiographic Exam:  Osteotomies sites appear to be stable with routine healing.  Assessment: 1. s/p 5th metatarsal head resection right. DOS: 08/29/2018   Plan of Care:    1. Patient was evaluated. X-rays reviewed 2. Dressing changed. Keep clean, dry and intact for one week.  3. Continue weightbearing in post op shoe.  4. Post op shoe dispensed.  5. Return to clinic in one week.    Felecia Shelling, DPM [facility]  Dr. Felecia Shelling, DPM    [address]                                        [place], [place] [address]                Office [phone]  Fax [phone]

## 2018-09-08 NOTE — Brief Op Note (Signed)
08/29/2018  2:51 PM  PATIENT:  Mitchell [name]  48 y.o. male  PRE-OPERATIVE DIAGNOSIS:  ULCER  POST-OPERATIVE DIAGNOSIS:  ULCER  PROCEDURE:  Procedure(s): SUB FIFTHE METATARSIA RIGHT FOOT (Right) METATARSAL HEAD RESECTION (Right) Debridement of ulcer on right fifth metatarsal (Right)  SURGEON:  Surgeon(s) and Role:    Felecia Shelling, DPM - Primary  PHYSICIAN ASSISTANT:   ASSISTANTS: none   ANESTHESIA:   local  EBL:  2 mL   BLOOD ADMINISTERED:none  DRAINS: none   LOCAL MEDICATIONS USED:  MARCAINE    and LIDOCAINE   SPECIMEN:  No Specimen  DISPOSITION OF SPECIMEN:  N/A  COUNTS:  YES  TOURNIQUET:   Total Tourniquet Time Documented: Calf (Right) - 15 minutes Total: Calf (Right) - 15 minutes   DICTATION: .Reubin Milan Dictation  PLAN OF CARE: Discharge to home after PACU  PATIENT DISPOSITION:  PACU - hemodynamically stable.   Delay start of Pharmacological VTE agent (>24hrs) due to surgical blood loss or risk of bleeding: not applicable

## 2018-09-11 ENCOUNTER — Encounter: Payer: Self-pay | Admitting: Podiatry

## 2018-09-11 ENCOUNTER — Ambulatory Visit (INDEPENDENT_AMBULATORY_CARE_PROVIDER_SITE_OTHER): Payer: Self-pay | Admitting: Podiatry

## 2018-09-11 DIAGNOSIS — L97512 Non-pressure chronic ulcer of other part of right foot with fat layer exposed: Secondary | ICD-10-CM

## 2018-09-11 DIAGNOSIS — Z9889 Other specified postprocedural states: Secondary | ICD-10-CM

## 2018-09-16 NOTE — Progress Notes (Signed)
   Subjective:  Patient presents today status post 5th metatarsal head resection right. DOS: 08/29/2018. He states he is doing well. He denies any significant pain or modifying factors. He has been using the post op shoe as directed. He denies any new complaints. Patient is here for further evaluation and treatment.    Past Medical History:  Diagnosis Date  . Bipolar disorder (HCC)   . CHF (congestive heart failure) (HCC)   . Chronic systolic heart failure (HCC) 11/2010   a. NICM 4/14 EF 20-25%, TR mild  . CVA (cerebral infarction)    No residual deficits  . Depression    PTSD,   . Diabetes mellitus    TYPE II; UNCONTROLLED BY HEMOGLOBIN A1c; STABLE AS  PER DISCHARGE  . Headache(784.0)   . 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 History of colonic polyps   . Hyperlipidemia   . Hypertension   . Myocardial infarction (HCC) 1987   (while playing football)  . Non-ischemic cardiomyopathy (HCC)    No ischemia on myoview, showed EF of 42%. 2D echo showed EF 50-55% with diatolic dysfunction in 2009  . Obesity   . OSA (obstructive sleep apnea)    Being set up again for c-pap. Pt sts he does not use a cpap, and the test was negative.  . Pneumonia   . Post-cardiac injury syndrome (HCC)    History of cardiac injury from blunt trauma  . PVCs (premature ventricular contractions)   . Schizophrenia (HCC)    Goes to [facility]  . SOB (shortness of breath)   . Stroke [facility]) 2005   some left side weakness  . Syncope    Recurrent, thought to be vasovagal. Also has h/o frequent PVCs.       Objective/Physical Exam Neurovascular status intact.  Skin incisions appear to be well coapted with sutures and staples intact. No sign of infectious process noted. No dehiscence. No active bleeding noted. Moderate edema noted to the surgical extremity.  Assessment: 1. s/p 5th metatarsal head resection right. DOS: 08/29/2018   Plan of Care:  1. Patient was evaluated.  2. Staples removed.    3. Discontinue using post op shoe.  4. Resume wearing DM shoes.  5. Return to clinic in 4 weeks.    Felecia Shelling, DPM [facility]  Dr. Felecia Shelling, DPM    [address]                                        [place], [place] [address]                Office [phone]  Fax [phone]

## 2018-09-27 ENCOUNTER — Other Ambulatory Visit: Payer: Self-pay | Admitting: Family Medicine

## 2018-09-27 DIAGNOSIS — J309 Allergic rhinitis, unspecified: Secondary | ICD-10-CM

## 2018-09-30 ENCOUNTER — Other Ambulatory Visit: Payer: Self-pay | Admitting: Family Medicine

## 2018-09-30 DIAGNOSIS — J309 Allergic rhinitis, unspecified: Secondary | ICD-10-CM

## 2018-10-09 ENCOUNTER — Other Ambulatory Visit: Payer: Self-pay

## 2018-10-09 ENCOUNTER — Encounter: Payer: Self-pay | Admitting: Podiatry

## 2018-10-09 ENCOUNTER — Ambulatory Visit (INDEPENDENT_AMBULATORY_CARE_PROVIDER_SITE_OTHER): Payer: Self-pay | Admitting: Podiatry

## 2018-10-09 DIAGNOSIS — Z9889 Other specified postprocedural states: Secondary | ICD-10-CM

## 2018-10-09 DIAGNOSIS — L97512 Non-pressure chronic ulcer of other part of right foot with fat layer exposed: Secondary | ICD-10-CM

## 2018-10-13 NOTE — Progress Notes (Signed)
   Subjective:  Patient presents today status post 5th metatarsal head resection right. DOS: 08/29/2018. He states he is doing well. He denies any pain or modifying factors. He denies any new complaints at this time. Patient is here for further evaluation and treatment.    Past Medical History:  Diagnosis Date  . Bipolar disorder (HCC)   . CHF (congestive heart failure) (HCC)   . Chronic systolic heart failure (HCC) 11/2010   a. NICM 4/14 EF 20-25%, TR mild  . CVA (cerebral infarction)    No residual deficits  . Depression    PTSD,   . Diabetes mellitus    TYPE II; UNCONTROLLED BY HEMOGLOBIN A1c; STABLE AS  PER DISCHARGE  . Headache(784.0)   . 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 History of colonic polyps   . Hyperlipidemia   . Hypertension   . Myocardial infarction (HCC) 1987   (while playing football)  . Non-ischemic cardiomyopathy (HCC)    No ischemia on myoview, showed EF of 42%. 2D echo showed EF 50-55% with diatolic dysfunction in 2009  . Obesity   . OSA (obstructive sleep apnea)    Being set up again for c-pap. Pt sts he does not use a cpap, and the test was negative.  . Pneumonia   . Post-cardiac injury syndrome (HCC)    History of cardiac injury from blunt trauma  . PVCs (premature ventricular contractions)   . Schizophrenia (HCC)    Goes to [facility]  . SOB (shortness of breath)   . Stroke [facility]) 2005   some left side weakness  . Syncope    Recurrent, thought to be vasovagal. Also has h/o frequent PVCs.      Objective: Physical Exam General: The patient is alert and oriented x3 in no acute distress.  Dermatology: Skin is cool, dry and supple bilateral lower extremities. Negative for open lesions or macerations.   Vascular: Palpable pedal pulses bilaterally. No edema or erythema noted. Capillary refill within normal limits.  Neurological: Epicritic and protective threshold grossly intact bilaterally.   Musculoskeletal Exam: All pedal and  ankle joints range of motion within normal limits bilateral. Muscle strength 5/5 in all groups bilateral.    Assessment: 1. s/p 5th metatarsal head resection right. DOS: 08/29/2018   Plan of Care:  1. Patient was evaluated.  2. Completely healed.  3. May resume full activity with no restrictions.  4. Continue wearing DM shoes.  5. Return to clinic as needed.     Felecia Shelling, DPM [facility]  Dr. Felecia Shelling, DPM    [address]                                        [place], [place] [address]                Office [phone]  Fax [phone]

## 2018-10-22 ENCOUNTER — Ambulatory Visit (INDEPENDENT_AMBULATORY_CARE_PROVIDER_SITE_OTHER): Payer: No Typology Code available for payment source | Admitting: Endocrinology

## 2018-10-22 ENCOUNTER — Encounter: Payer: Self-pay | Admitting: Endocrinology

## 2018-10-22 ENCOUNTER — Other Ambulatory Visit: Payer: Self-pay

## 2018-10-22 VITALS — BP 146/88 | HR 92 | Temp 98.0°F | Ht 71.0 in | Wt 286.0 lb

## 2018-10-22 DIAGNOSIS — E118 Type 2 diabetes mellitus with unspecified complications: Secondary | ICD-10-CM

## 2018-10-22 LAB — POCT GLYCOSYLATED HEMOGLOBIN (HGB A1C): Hemoglobin A1C: 12.7 % — AB (ref 4.0–5.6)

## 2018-10-22 MED ORDER — BASAGLAR KWIKPEN 100 UNIT/ML ~~LOC~~ SOPN: 150.0000 [IU] | PEN_INJECTOR | SUBCUTANEOUS | 99 refills | Status: DC

## 2018-10-22 NOTE — Patient Instructions (Addendum)
Your blood pressure is high today.  Please see your primary care provider soon, to have it rechecked check your blood sugar twice a day.  vary the time of day when you check, between before the 3 meals, and at bedtime.  also check if you have symptoms of your blood sugar being too high or too low.  please keep a record of the readings and bring it to your next appointment here (or you can bring the meter itself).  You can write it on any piece of paper.  please call us sooner if your blood sugar goes below 70, or if you have a lot of readings over 200.  I have sent a prescription to your pharmacy: to continue the Basaglar.  Here is how you can get a copay card.   Please come back for a follow-up appointment in 2 months.

## 2018-10-22 NOTE — Progress Notes (Signed)
Subjective:    Patient ID: Mitchell [name], male    DOB: [birth year], 48 y.o.   MRN: [id number]  HPI Pt returns for f/u of diabetes mellitus: DM type: Insulin-requiring type 2 Dx'ed: 1093 Complications: polyneuropathy, foot ulcer, and CVA.   Therapy: insulin since 2017, and jardiance.   DKA: never Severe hypoglycemia: never Pancreatitis: never Pancreatic imaging: normal on 2012 CT.  Other: he declines multiple daily injections; CHF is a relative contraindication to metformin; he requested to d/c jardiance.   Interval history:  Pt says he has reduced the insulin, because he says ins does not pay for it. no cbg record, but states cbg's are in the 200's Past Medical History:  Diagnosis Date  . Bipolar disorder ([place])   . CHF (congestive heart failure) ([place])   . Chronic systolic heart failure ([place]) 11/2010   a. NICM 4/14 EF 20-25%, TR mild  . CVA (cerebral infarction)    No residual deficits  . Depression    PTSD,   . Diabetes mellitus    TYPE II; UNCONTROLLED BY HEMOGLOBIN A1c; STABLE AS  PER DISCHARGE  . Headache(784.0)   . 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 History of colonic polyps   . Hyperlipidemia   . Hypertension   . Myocardial infarction ([place]) 1987   (while playing football)  . Non-ischemic cardiomyopathy ([facility])    No ischemia on myoview, showed EF of 42%. 2D echo showed EF 23-55% with diatolic dysfunction in 7322  . Obesity   . OSA (obstructive sleep apnea)    Being set up again for c-pap. Pt sts he does not use a cpap, and the test was negative.  . Pneumonia   . Post-cardiac injury syndrome ([place])    History of cardiac injury from blunt trauma  . PVCs (premature ventricular contractions)   . Schizophrenia ([place])    Goes to [facility]  . SOB (shortness of breath)   . Stroke [facility]) 2005   some left side weakness  . Syncope    Recurrent, thought to be vasovagal. Also has h/o frequent PVCs.     Past Surgical History:  Procedure Laterality Date   . CARDIAC CATHETERIZATION  12/19/10   DIFFUSE NONOBSTRUCTIVE CAD; NONISCHEMIC CARDIOMYOPATHY; LEFT VENTRICULAR ANGIOGRAM WAS PERFORMED SECONDARY TO  ELEVATED LEFT VENTRICULAR FILLING PRESSURES  . COLONOSCOPY W/ POLYPECTOMY    . METATARSAL HEAD EXCISION Right 08/29/2018   Procedure: METATARSAL HEAD RESECTION;  Surgeon: Edrick Kins, DPM;  Location: [place];  Service: Podiatry;  Laterality: Right;  . METATARSAL OSTEOTOMY Right 08/29/2018   Procedure: SUB FIFTHE METATARSIA RIGHT FOOT;  Surgeon: Edrick Kins, DPM;  Location: [place];  Service: Podiatry;  Laterality: Right;  . MULTIPLE EXTRACTIONS WITH ALVEOLOPLASTY  01/27/2014   "all my teeth; 4 Quadrants of alveoloplasty  . MULTIPLE EXTRACTIONS WITH ALVEOLOPLASTY N/A 01/27/2014   Procedure: EXTRACTION OF TEETH #'1, 2, 3, 4, 5, 6, 7, 8, 9, 10, 11, 12, 13, 14, 15, 16, 17, 20, 21, 22, 23, 24, 25, 26, 27, 28, 29, 31 and 32 WITH ALVEOLOPLASTY;  Surgeon: Lenn Cal, DDS;  Location: [place];  Service: Oral Surgery;  Laterality: N/A;  . ORIF FINGER / THUMB FRACTURE Right   . RIGHT/LEFT HEART CATH AND CORONARY ANGIOGRAPHY N/A 09/26/2017   Procedure: RIGHT/LEFT HEART CATH AND CORONARY ANGIOGRAPHY;  Surgeon: Jolaine Artist, MD;  Location: [place] CV [facility];  Service: Cardiovascular;  Laterality: N/A;  . WOUND DEBRIDEMENT Right 08/29/2018   Procedure:  Debridement of ulcer on right fifth metatarsal;  Surgeon: Edrick Kins, DPM;  Location: [place];  Service: Podiatry;  Laterality: Right;    Social History   Socioeconomic History  . Marital status: Married    Spouse name: Not on file  . Number of children: 5  . Years of education: Not on file  . Highest education level: Not on file  Occupational History  . Occupation: Dietitian  Social Needs  . Financial resource strain: Not on file  . Food insecurity:    Worry: Not on file    Inability: Not on file  . Transportation needs:    Medical: Not on file    Non-medical: Not on file  Tobacco Use  .  Smoking status: Never Smoker  . Smokeless tobacco: Never Used  Substance and Sexual Activity  . Alcohol use: No  . Drug use: No  . Sexual activity: Not on file  Lifestyle  . Physical activity:    Days per week: Not on file    Minutes per session: Not on file  . Stress: Not on file  Relationships  . Social connections:    Talks on phone: Not on file    Gets together: Not on file    Attends religious service: Not on file    Active member of club or organization: Not on file    Attends meetings of clubs or organizations: Not on file    Relationship status: Not on file  . Intimate partner violence:    Fear of current or ex partner: Not on file    Emotionally abused: Not on file    Physically abused: Not on file    Forced sexual activity: Not on file  Other Topics Concern  . 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    Current Outpatient Medications on File Prior to Visit  Medication Sig Dispense Refill  . aspirin 81 MG EC tablet Take 1 tablet (81 mg total) by mouth daily. 90 tablet 3  . atorvastatin (LIPITOR) 40 MG tablet TAKE 2 TABLETS BY MOUTH AT BEDTIME (Patient taking differently: Take 80 mg by mouth at bedtime. ) 60 tablet 11  . Blood Glucose Monitoring Suppl (ACCU-CHEK GUIDE ME) w/Device KIT 1 each by Does not apply route 2 (two) times daily. Use to monitor glucose levels BID 1 kit 0  . carvedilol (COREG) 6.25 MG tablet Take 1.5 tablets (9.375 mg total) by mouth 2 (two) times daily with a meal. 90 tablet 3  . cetirizine (ZYRTEC) 10 MG tablet Take 1 tablet by mouth once daily 30 tablet 5  . clopidogrel (PLAVIX) 75 MG tablet TAKE 1 TABLET BY MOUTH ONCE DAILY (Patient taking differently: Take 75 mg by mouth daily. ) 30 tablet 11  . digoxin (LANOXIN) 0.125 MG tablet TAKE 1 TABLET BY MOUTH ONCE DAILY (Patient taking differently: Take  0.125 mg by mouth daily. ) 30 tablet 11  . fluticasone (FLONASE) 50 MCG/ACT nasal spray USE 2 SPRAY(S) IN EACH NOSTRIL DAILY 16 g 3  . furosemide (LASIX) 40 MG tablet Take 40 mg by mouth daily.    [name] Kitchen glucose blood (ACCU-CHEK GUIDE) test strip Use to monitor glucose levels BID 100 each 12  . Lancets 28G MISC Use for glucose testing up to BID 100 each 12  . Lancets Misc. (ACCU-CHEK  FASTCLIX LANCET) KIT Use to monitor glucose levels BID 200 kit 1  . oxyCODONE-acetaminophen (PERCOCET) 5-325 MG tablet Take 1 tablet by mouth every 6 (six) hours as needed for severe pain. 30 tablet 0  . Potassium Chloride ER 20 MEQ TBCR Take 20 mEq by mouth daily. 30 tablet 3  . sacubitril-valsartan (ENTRESTO) 97-103 MG Take 1 tablet by mouth 2 (two) times daily. 60 tablet 11   No current facility-administered medications on file prior to visit.     Allergies  Allergen Reactions  . Nsaids Anaphylaxis and Other (See Comments)    Able to tolerate aspirin     Family History  Problem Relation Age of Onset  . Heart disease Mother        MI  . Heart failure Mother   . Diabetes Mother        ALSO IN MOST OF HIS SIBLINGS; 2 UNLCES HAVE ALSO PASSED AWAY FROM DM  . Cardiomyopathy Mother   . Cancer - Ovarian Mother   . Heart disease Father   . Hypertension Father   . Diabetes Father   . Diabetes Sister   . Diabetes Brother     BP (!) 146/88   Pulse 92   Temp 98 F (36.7 C)   Ht 5' 11"  (1.803 m)   Wt 286 lb (129.7 kg)   SpO2 98%   BMI 39.89 kg/m    Review of Systems He denies hypoglycemia.      Objective:   Physical Exam VITAL SIGNS:  See vs page GENERAL: no distress Pulses: foot pulses are intact bilaterally.   MSK: no deformity of the feet or ankles.  CV: trace bilat edema of the legs.   Skin:  normal color and temp on the feet and ankles.  right foot is still bandaged (sees wound care) Neuro: sensation is intact to touch on the feet, but decreased from normal.   EXTEMITIES:There is bilateral  onychomycosis of the toenails.   Lab Results  Component Value Date   HGBA1C 12.7 (A) 10/22/2018       Assessment & Plan:  HTN: is noted today.   Insulin-requiring type 2 DM, with CVA: much worse.  Noncompliance with cbg recording and insulin: pt's insurance is called, and basaglar is preferred.  I gave pt info about getting a discount card.   Patient Instructions  Your blood pressure is high today.  Please see your primary care provider soon, to have it rechecked check your blood sugar twice a day.  vary the time of day when you check, between before the 3 meals, and at bedtime.  also check if you have symptoms of your blood sugar being too high or too low.  please keep a record of the readings and bring it to your next appointment here (or you can bring the meter itself).  You can write it on any piece of paper.  please call us sooner if your blood sugar goes below 70, or if you have a lot of readings over 200.  I have sent a prescription to your pharmacy: to continue the [place].  Here is how you can get a copay card.   Please come back for a follow-up appointment in 2 months.

## 2018-10-29 ENCOUNTER — Other Ambulatory Visit: Payer: Self-pay | Admitting: Family Medicine

## 2018-10-29 DIAGNOSIS — I5022 Chronic systolic (congestive) heart failure: Secondary | ICD-10-CM

## 2018-10-29 DIAGNOSIS — I10 Essential (primary) hypertension: Secondary | ICD-10-CM

## 2018-11-20 ENCOUNTER — Other Ambulatory Visit (HOSPITAL_COMMUNITY): Payer: Self-pay | Admitting: Student

## 2018-12-20 ENCOUNTER — Encounter: Payer: Self-pay | Admitting: Endocrinology

## 2018-12-24 ENCOUNTER — Ambulatory Visit (INDEPENDENT_AMBULATORY_CARE_PROVIDER_SITE_OTHER): Payer: No Typology Code available for payment source | Admitting: Endocrinology

## 2018-12-24 ENCOUNTER — Other Ambulatory Visit: Payer: Self-pay

## 2018-12-24 DIAGNOSIS — E118 Type 2 diabetes mellitus with unspecified complications: Secondary | ICD-10-CM

## 2018-12-24 NOTE — Progress Notes (Signed)
Subjective:    Patient ID: Mitchell [name], male    DOB: 11/12/1970, 48 y.o.   MRN: [id number]  HPI telehealth visit today via phone x 5 monites Alternatives to telehealth are presented to this patient, and the patient agrees to the telehealth visit. Pt is advised of the cost of the visit, and agrees to this, also.   Patient is at home, and I am at the office.   Persons attending the telehealth visit: the patient and I Pt returns for f/u of diabetes mellitus: DM type: Insulin-requiring type 2 Dx'ed: 7939 Complications: polyneuropathy, foot ulcer, renal insuff, and CVA.   Therapy: insulin since 2017, and jardiance.   DKA: never Severe hypoglycemia: never Pancreatitis: never Pancreatic imaging: normal on 2012 CT.  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  Interval history:  Pt says cbg varies from 97-130.  he says he never misses the insulin.  He denies hypoglycemia.   Past Medical History:  Diagnosis Date  . Bipolar disorder ([place])   . CHF (congestive heart failure) ([place])   . Chronic systolic heart failure ([place]) 11/2010   a. NICM 4/14 EF 20-25%, TR mild  . CVA (cerebral infarction)    No residual deficits  . Depression    PTSD,   . Diabetes mellitus    TYPE II; UNCONTROLLED BY HEMOGLOBIN A1c; STABLE AS  PER DISCHARGE  . Headache(784.0)   . 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 History of colonic polyps   . Hyperlipidemia   . Hypertension   . Myocardial infarction ([place]) 1987   (while playing football)  . Non-ischemic cardiomyopathy ([facility])    No ischemia on myoview, showed EF of 42%. 2D echo showed EF 03-00% with diatolic dysfunction in 9233  . Obesity   . OSA (obstructive sleep apnea)    Being set up again for c-pap. Pt sts he does not use a cpap, and the test was negative.  . Pneumonia   . Post-cardiac injury syndrome ([place])    History of cardiac injury from  blunt trauma  . PVCs (premature ventricular contractions)   . Schizophrenia ([place])    Goes to [facility]  . SOB (shortness of breath)   . Stroke [facility]) 2005   some left side weakness  . Syncope    Recurrent, thought to be vasovagal. Also has h/o frequent PVCs.     Past Surgical History:  Procedure Laterality Date  . CARDIAC CATHETERIZATION  12/19/10   DIFFUSE NONOBSTRUCTIVE CAD; NONISCHEMIC CARDIOMYOPATHY; LEFT VENTRICULAR ANGIOGRAM WAS PERFORMED SECONDARY TO  ELEVATED LEFT VENTRICULAR FILLING PRESSURES  . COLONOSCOPY W/ POLYPECTOMY    . METATARSAL HEAD EXCISION Right 08/29/2018   Procedure: METATARSAL HEAD RESECTION;  Surgeon: Edrick Kins, DPM;  Location: [place];  Service: Podiatry;  Laterality: Right;  . METATARSAL OSTEOTOMY Right 08/29/2018   Procedure: SUB FIFTHE METATARSIA RIGHT FOOT;  Surgeon: Edrick Kins, DPM;  Location: [place];  Service: Podiatry;  Laterality: Right;  . MULTIPLE EXTRACTIONS WITH ALVEOLOPLASTY  01/27/2014   "all my teeth; 4 Quadrants of alveoloplasty  . MULTIPLE EXTRACTIONS WITH ALVEOLOPLASTY N/A 01/27/2014   Procedure: EXTRACTION OF TEETH #'1, 2, 3, 4, 5, 6, 7, 8, 9, 10, 11, 12, 13, 14, 15, 16, 17, 20, 21, 22, 23, 24, 25, 26, 27, 28, 29, 31 and 32 WITH ALVEOLOPLASTY;  Surgeon: Lenn Cal, DDS;  Location: [place];  Service: Oral Surgery;  Laterality: N/A;  . ORIF FINGER / THUMB FRACTURE Right   . RIGHT/LEFT HEART CATH AND CORONARY ANGIOGRAPHY N/A 09/26/2017   Procedure: RIGHT/LEFT HEART CATH AND CORONARY ANGIOGRAPHY;  Surgeon: Jolaine Artist, MD;  Location: [place] [facility];  Service: Cardiovascular;  Laterality: N/A;  . WOUND DEBRIDEMENT Right 08/29/2018   Procedure: Debridement of ulcer on right fifth metatarsal;  Surgeon: Edrick Kins, DPM;  Location: [place];  Service: Podiatry;  Laterality: Right;    Social History   Socioeconomic History  . Marital status: Married    Spouse name: Not on file  . Number of children: 5  . Years  of education: Not on file  . Highest education level: Not on file  Occupational History  . Occupation: Dietitian  Social Needs  . Financial resource strain: Not on file  . Food insecurity:    Worry: Not on file    Inability: Not on file  . Transportation needs:    Medical: Not on file    Non-medical: Not on file  Tobacco Use  . Smoking status: Never Smoker  . Smokeless tobacco: Never Used  Substance and Sexual Activity  . Alcohol use: No  . Drug use: No  . Sexual activity: Not on file  Lifestyle  . Physical activity:    Days per week: Not on file    Minutes per session: Not on file  . Stress: Not on file  Relationships  . Social connections:    Talks on phone: Not on file    Gets together: Not on file    Attends religious service: Not on file    Active member of club or organization: Not on file    Attends meetings of clubs or organizations: Not on file    Relationship status: Not on file  . Intimate partner violence:    Fear of current or ex partner: Not on file    Emotionally abused: Not on file    Physically abused: Not on file    Forced sexual activity: Not on file  Other Topics Concern  . 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    Current Outpatient Medications on File Prior to Visit  Medication Sig Dispense Refill  . aspirin 81 MG EC tablet Take 1 tablet (81 mg total) by mouth daily. 90 tablet 3  . atorvastatin (LIPITOR) 40 MG tablet TAKE 2 TABLETS BY MOUTH AT BEDTIME (Patient taking differently: Take 80 mg by mouth at bedtime. ) 60 tablet 11  . Blood Glucose Monitoring Suppl (ACCU-CHEK GUIDE ME) w/Device KIT 1 each by Does not apply route 2 (two) times daily. Use to monitor glucose levels BID 1 kit 0  . carvedilol (COREG) 6.25 MG tablet Take 1.5 tablets (9.375 mg total) by mouth 2 (two) times daily with a meal. 90  tablet 3  . cetirizine (ZYRTEC) 10 MG tablet Take 1 tablet by mouth once daily 30 tablet 5  . clopidogrel (PLAVIX) 75 MG tablet TAKE 1 TABLET BY MOUTH ONCE DAILY (Patient taking differently: Take 75 mg by mouth daily. ) 30 tablet 11  . digoxin (LANOXIN) 0.125 MG tablet Take 1 tablet (0.125 mg total) by mouth daily. 30 tablet 5  . ENTRESTO 97-103 MG Take 1 tablet by mouth twice daily 180 tablet 0  .  fluticasone (FLONASE) 50 MCG/ACT nasal spray USE 2 SPRAY(S) IN EACH NOSTRIL DAILY 16 g 3  . furosemide (LASIX) 40 MG tablet Take 40 mg by mouth daily.    [name] Kitchen glucose blood (ACCU-CHEK GUIDE) test strip Use to monitor glucose levels BID 100 each 12  . Insulin Glargine (BASAGLAR KWIKPEN) 100 UNIT/ML SOPN Inject 1.5 mLs (150 Units total) into the skin every morning. And pen needles 1/day 20 pen prn  . Lancets 28G MISC Use for glucose testing up to BID 100 each 12  . Lancets Misc. (ACCU-CHEK FASTCLIX LANCET) KIT Use to monitor glucose levels BID 200 kit 1  . Potassium Chloride ER 20 MEQ TBCR Take 20 mEq by mouth daily. 30 tablet 3   No current facility-administered medications on file prior to visit.     Allergies  Allergen Reactions  . Nsaids Anaphylaxis and Other (See Comments)    Able to tolerate aspirin     Family History  Problem Relation Age of Onset  . Heart disease Mother        MI  . Heart failure Mother   . Diabetes Mother        ALSO IN MOST OF HIS SIBLINGS; 2 UNLCES HAVE ALSO PASSED AWAY FROM DM  . Cardiomyopathy Mother   . Cancer - Ovarian Mother   . Heart disease Father   . Hypertension Father   . Diabetes Father   . Diabetes Sister   . Diabetes Brother      Review of Systems He denies hypoglycemia.      Objective:   Physical Exam  Lab Results  Component Value Date   CREATININE 1.28 (H) 08/21/2018   BUN 21 (H) 08/21/2018   NA 141 08/21/2018   K 4.5 08/21/2018   CL 106 08/21/2018   CO2 24 08/21/2018       Assessment & Plan:  Insulin-requiring type 2 DM, with  renal insuff: apparently well-controlled.    Patient Instructions  check your blood sugar twice a day.  vary the time of day when you check, between before the 3 meals, and at bedtime.  also check if you have symptoms of your blood sugar being too high or too low.  please keep a record of the readings and bring it to your next appointment here (or you can bring the meter itself).  You can write it on any piece of paper.  please call us sooner if your blood sugar goes below 70, or if you have a lot of readings over 200.  I have sent a prescription to your pharmacy: to continue the [place].  Here is how you can get a copay card.   Please come back for a follow-up appointment in 1-2 months.

## 2018-12-24 NOTE — Patient Instructions (Signed)
check your blood sugar twice a day.  vary the time of day when you check, between before the 3 meals, and at bedtime.  also check if you have symptoms of your blood sugar being too high or too low.  please keep a record of the readings and bring it to your next appointment here (or you can bring the meter itself).  You can write it on any piece of paper.  please call us sooner if your blood sugar goes below 70, or if you have a lot of readings over 200.  I have sent a prescription to your pharmacy: to continue the Basaglar.  Here is how you can get a copay card.   Please come back for a follow-up appointment in 1-2 months.

## 2019-01-30 ENCOUNTER — Other Ambulatory Visit: Payer: Self-pay

## 2019-02-04 ENCOUNTER — Encounter: Payer: Self-pay | Admitting: Endocrinology

## 2019-02-04 ENCOUNTER — Other Ambulatory Visit: Payer: Self-pay

## 2019-02-04 ENCOUNTER — Ambulatory Visit (INDEPENDENT_AMBULATORY_CARE_PROVIDER_SITE_OTHER): Payer: No Typology Code available for payment source | Admitting: Endocrinology

## 2019-02-04 VITALS — BP 144/84 | HR 94 | Ht 71.0 in | Wt 289.8 lb

## 2019-02-04 DIAGNOSIS — N529 Male erectile dysfunction, unspecified: Secondary | ICD-10-CM | POA: Diagnosis not present

## 2019-02-04 DIAGNOSIS — E118 Type 2 diabetes mellitus with unspecified complications: Secondary | ICD-10-CM | POA: Diagnosis not present

## 2019-02-04 LAB — POCT GLYCOSYLATED HEMOGLOBIN (HGB A1C): Hemoglobin A1C: 12.3 % — AB (ref 4.0–5.6)

## 2019-02-04 MED ORDER — BASAGLAR KWIKPEN 100 UNIT/ML ~~LOC~~ SOPN: 150.0000 [IU] | PEN_INJECTOR | SUBCUTANEOUS | 99 refills | Status: DC

## 2019-02-04 NOTE — Patient Instructions (Addendum)
Your blood pressure is high today.  Please see your primary care provider soon, to have it rechecked.   I have sent a prescription to your pharmacy, for the insulin.  Her is a discount card.   check your blood sugar 4 times a day.  vary the time of day when you check, between before the 3 meals, and at bedtime.  also check if you have symptoms of your blood sugar being too high or too low.  please keep a record of the readings and bring it to your next appointment here (or you can bring the meter itself).  You can write it on any piece of paper.  please call us sooner if your blood sugar goes below 70, or if you have a lot of readings over 200. Blood tests are requested for you today.  We'll let you know about the results.  Please see a Urology specialist.  you will receive a phone call, about a day and time for an appointment Please come back for a follow-up appointment in 2 months.

## 2019-02-04 NOTE — Progress Notes (Signed)
Subjective:    Patient ID: Mitchell [name], male    DOB: 1970/11/29, 48 y.o.   MRN: [id number]  HPI Pt returns for f/u of diabetes mellitus: DM type: Insulin-requiring type 2 Dx'ed: 7829 Complications: polyneuropathy, foot ulcer, renal insuff, and CVA.   Therapy: insulin since 2017, and jardiance.   DKA: never Severe hypoglycemia: never Pancreatitis: never Pancreatic imaging: normal on 2012 CT.  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  Interval history:  Pt says cbg's are in the 100's.  he says he has been missing the insulin, as he says pharmacy has not been giving him enough.  He denies hypoglycemia.   Past Medical History:  Diagnosis Date  . Bipolar disorder ([place])   . CHF (congestive heart failure) ([place])   . Chronic systolic heart failure ([place]) 11/2010   a. NICM 4/14 EF 20-25%, TR mild  . CVA (cerebral infarction)    No residual deficits  . Depression    PTSD,   . Diabetes mellitus    TYPE II; UNCONTROLLED BY HEMOGLOBIN A1c; STABLE AS  PER DISCHARGE  . Headache(784.0)   . 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 History of colonic polyps   . Hyperlipidemia   . Hypertension   . Myocardial infarction ([place]) 1987   (while playing football)  . Non-ischemic cardiomyopathy (HCC)    No ischemia on myoview, showed EF of 42%. 2D echo showed EF 56-21% with diatolic dysfunction in 3086  . Obesity   . OSA (obstructive sleep apnea)    Being set up again for c-pap. Pt sts he does not use a cpap, and the test was negative.  . Pneumonia   . Post-cardiac injury syndrome ([place])    History of cardiac injury from blunt trauma  . PVCs (premature ventricular contractions)   . Schizophrenia ([place])    Goes to [facility]  . SOB (shortness of breath)   . Stroke [facility]) 2005   some left side weakness  . Syncope    Recurrent, thought to be vasovagal. Also has h/o  frequent PVCs.     Past Surgical History:  Procedure Laterality Date  . CARDIAC CATHETERIZATION  12/19/10   DIFFUSE NONOBSTRUCTIVE CAD; NONISCHEMIC CARDIOMYOPATHY; LEFT VENTRICULAR ANGIOGRAM WAS PERFORMED SECONDARY TO  ELEVATED LEFT VENTRICULAR FILLING PRESSURES  . COLONOSCOPY W/ POLYPECTOMY    . METATARSAL HEAD EXCISION Right 08/29/2018   Procedure: METATARSAL HEAD RESECTION;  Surgeon: Edrick Kins, DPM;  Location: [place];  Service: Podiatry;  Laterality: Right;  . METATARSAL OSTEOTOMY Right 08/29/2018   Procedure: SUB FIFTHE METATARSIA RIGHT FOOT;  Surgeon: Edrick Kins, DPM;  Location: [place];  Service: Podiatry;  Laterality: Right;  . MULTIPLE EXTRACTIONS WITH ALVEOLOPLASTY  01/27/2014   "all my teeth; 4 Quadrants of alveoloplasty  . MULTIPLE EXTRACTIONS WITH ALVEOLOPLASTY N/A 01/27/2014   Procedure: EXTRACTION OF TEETH #'1, 2, 3, 4, 5, 6, 7, 8, 9, 10, 11, 12, 13, 14, 15, 16, 17, 20, 21, 22, 23, 24, 25, 26, 27, 28, 29, 31 and 32 WITH ALVEOLOPLASTY;  Surgeon: Lenn Cal, DDS;  Location: [place];  Service: Oral Surgery;  Laterality: N/A;  . ORIF FINGER / THUMB FRACTURE Right   . RIGHT/LEFT HEART CATH AND CORONARY ANGIOGRAPHY N/A 09/26/2017   Procedure: RIGHT/LEFT HEART CATH AND CORONARY ANGIOGRAPHY;  Surgeon: Jolaine Artist, MD;  Location: [place] [facility];  Service:  Cardiovascular;  Laterality: N/A;  . WOUND DEBRIDEMENT Right 08/29/2018   Procedure: Debridement of ulcer on right fifth metatarsal;  Surgeon: Edrick Kins, DPM;  Location: [place];  Service: Podiatry;  Laterality: Right;    Social History   Socioeconomic History  . Marital status: Married    Spouse name: Not on file  . Number of children: 5  . Years of education: Not on file  . Highest education level: Not on file  Occupational History  . Occupation: Dietitian  Social Needs  . Financial resource strain: Not on file  . Food insecurity    Worry: Not on file    Inability: Not on file  . Transportation needs     Medical: Not on file    Non-medical: Not on file  Tobacco Use  . Smoking status: Never Smoker  . Smokeless tobacco: Never Used  Substance and Sexual Activity  . Alcohol use: No  . Drug use: No  . Sexual activity: Not on file  Lifestyle  . Physical activity    Days per week: Not on file    Minutes per session: Not on file  . Stress: Not on file  Relationships  . Social Herbalist on phone: Not on file    Gets together: Not on file    Attends religious service: Not on file    Active member of club or organization: Not on file    Attends meetings of clubs or organizations: Not on file    Relationship status: Not on file  . Intimate partner violence    Fear of current or ex partner: Not on file    Emotionally abused: Not on file    Physically abused: Not on file    Forced sexual activity: Not on file  Other Topics Concern  . 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    Current Outpatient Medications on File Prior to Visit  Medication Sig Dispense Refill  . aspirin 81 MG EC tablet Take 1 tablet (81 mg total) by mouth daily. 90 tablet 3  . atorvastatin (LIPITOR) 40 MG tablet TAKE 2 TABLETS BY MOUTH AT BEDTIME (Patient taking differently: Take 80 mg by mouth at bedtime. ) 60 tablet 11  . Blood Glucose Monitoring Suppl (ACCU-CHEK GUIDE ME) w/Device KIT 1 each by Does not apply route 2 (two) times daily. Use to monitor glucose levels BID 1 kit 0  . carvedilol (COREG) 6.25 MG tablet Take 1.5 tablets (9.375 mg total) by mouth 2 (two) times daily with a meal. 90 tablet 3  . cetirizine (ZYRTEC) 10 MG tablet Take 1 tablet by mouth once daily 30 tablet 5  . clopidogrel (PLAVIX) 75 MG tablet TAKE 1 TABLET BY MOUTH ONCE DAILY (Patient taking differently: Take 75 mg by mouth daily. ) 30 tablet 11  . digoxin (LANOXIN) 0.125 MG tablet Take  1 tablet (0.125 mg total) by mouth daily. 30 tablet 5  . ENTRESTO 97-103 MG Take 1 tablet by mouth twice daily 180 tablet 0  . fluticasone (FLONASE) 50 MCG/ACT nasal spray USE 2 SPRAY(S) IN EACH NOSTRIL DAILY 16 g 3  . furosemide (LASIX) 40 MG tablet Take 40 mg by mouth daily.    [name] Kitchen glucose blood (ACCU-CHEK GUIDE) test strip Use to monitor glucose levels BID 100 each 12  .  Lancets 28G MISC Use for glucose testing up to BID 100 each 12  . Lancets Misc. (ACCU-CHEK FASTCLIX LANCET) KIT Use to monitor glucose levels BID 200 kit 1  . Potassium Chloride ER 20 MEQ TBCR Take 20 mEq by mouth daily. 30 tablet 3   No current facility-administered medications on file prior to visit.     Allergies  Allergen Reactions  . Nsaids Anaphylaxis and Other (See Comments)    Able to tolerate aspirin     Family History  Problem Relation Age of Onset  . Heart disease Mother        MI  . Heart failure Mother   . Diabetes Mother        ALSO IN MOST OF HIS SIBLINGS; 2 UNLCES HAVE ALSO PASSED AWAY FROM DM  . Cardiomyopathy Mother   . Cancer - Ovarian Mother   . Heart disease Father   . Hypertension Father   . Diabetes Father   . Diabetes Sister   . Diabetes Brother     BP (!) 144/84 (BP Location: Left Arm, Patient Position: Sitting, Cuff Size: Large)   Pulse 94   Ht 5' 11" (1.803 m)   Wt 289 lb 12.8 oz (131.5 kg)   SpO2 97%   BMI 40.42 kg/m    Review of Systems He denies hypoglycemia.  He has ED sxs--Viagra did not help.      Objective:   Physical Exam VITAL SIGNS:  See vs page GENERAL: no distress Pulses: dorsalis pedis intact bilat.   MSK: no deformity of the feet CV: 1+ bilat leg edema Skin:  no ulcer on the feet.  normal color and temp on the feet. Neuro: sensation is intact to touch on the feet   Lab Results  Component Value Date   HGBA1C 12.3 (A) 02/04/2019       Assessment & Plan:  Insulin-requiring type 2 DM, with renal insuff: much worse, due to noncompliance.  HTN: is  noted today ED: persistent  Patient Instructions  Your blood pressure is high today.  Please see your primary care provider soon, to have it rechecked.   I have sent a prescription to your pharmacy, for the insulin.  Her is a discount card.   check your blood sugar 4 times a day.  vary the time of day when you check, between before the 3 meals, and at bedtime.  also check if you have symptoms of your blood sugar being too high or too low.  please keep a record of the readings and bring it to your next appointment here (or you can bring the meter itself).  You can write it on any piece of paper.  please call us sooner if your blood sugar goes below 70, or if you have a lot of readings over 200. Blood tests are requested for you today.  We'll let you know about the results.  Please see a Urology specialist.  you will receive a phone call, about a day and time for an appointment Please come back for a follow-up appointment in 2 months.

## 2019-02-07 LAB — TESTOSTERONE,FREE AND TOTAL
Testosterone, Free: 5.7 pg/mL — ABNORMAL LOW (ref 7.2–24.0)
Testosterone: 264 ng/dL (ref 264–916)

## 2019-02-28 ENCOUNTER — Emergency Department (HOSPITAL_BASED_OUTPATIENT_CLINIC_OR_DEPARTMENT_OTHER)
Admission: EM | Admit: 2019-02-28 | Discharge: 2019-02-28 | Disposition: A | Discharge status: Home / Self Care | Payer: No Typology Code available for payment source | Attending: Emergency Medicine | Admitting: Emergency Medicine

## 2019-02-28 ENCOUNTER — Emergency Department (HOSPITAL_BASED_OUTPATIENT_CLINIC_OR_DEPARTMENT_OTHER): Payer: No Typology Code available for payment source

## 2019-02-28 ENCOUNTER — Other Ambulatory Visit: Payer: Self-pay

## 2019-02-28 ENCOUNTER — Encounter (HOSPITAL_BASED_OUTPATIENT_CLINIC_OR_DEPARTMENT_OTHER): Payer: Self-pay

## 2019-02-28 DIAGNOSIS — I110 Hypertensive heart disease with heart failure: Secondary | ICD-10-CM | POA: Insufficient documentation

## 2019-02-28 DIAGNOSIS — Y998 Other external cause status: Secondary | ICD-10-CM | POA: Diagnosis not present

## 2019-02-28 DIAGNOSIS — S46912A Strain of unspecified muscle, fascia and tendon at shoulder and upper arm level, left arm, initial encounter: Secondary | ICD-10-CM | POA: Diagnosis not present

## 2019-02-28 DIAGNOSIS — Y92410 Unspecified street and highway as the place of occurrence of the external cause: Secondary | ICD-10-CM | POA: Insufficient documentation

## 2019-02-28 DIAGNOSIS — Z794 Long term (current) use of insulin: Secondary | ICD-10-CM | POA: Diagnosis not present

## 2019-02-28 DIAGNOSIS — I252 Old myocardial infarction: Secondary | ICD-10-CM | POA: Insufficient documentation

## 2019-02-28 DIAGNOSIS — Y9389 Activity, other specified: Secondary | ICD-10-CM | POA: Insufficient documentation

## 2019-02-28 DIAGNOSIS — Z8673 Personal history of transient ischemic attack (TIA), and cerebral infarction without residual deficits: Secondary | ICD-10-CM | POA: Insufficient documentation

## 2019-02-28 DIAGNOSIS — Z7982 Long term (current) use of aspirin: Secondary | ICD-10-CM | POA: Insufficient documentation

## 2019-02-28 DIAGNOSIS — Z79899 Other long term (current) drug therapy: Secondary | ICD-10-CM | POA: Insufficient documentation

## 2019-02-28 DIAGNOSIS — E119 Type 2 diabetes mellitus without complications: Secondary | ICD-10-CM | POA: Diagnosis not present

## 2019-02-28 DIAGNOSIS — I5022 Chronic systolic (congestive) heart failure: Secondary | ICD-10-CM | POA: Insufficient documentation

## 2019-02-28 DIAGNOSIS — Z7902 Long term (current) use of antithrombotics/antiplatelets: Secondary | ICD-10-CM | POA: Insufficient documentation

## 2019-02-28 DIAGNOSIS — S4992XA Unspecified injury of left shoulder and upper arm, initial encounter: Secondary | ICD-10-CM | POA: Diagnosis present

## 2019-02-28 MED ORDER — HYDROCODONE-ACETAMINOPHEN 5-325 MG PO TABS: 1.0000 | ORAL_TABLET | ORAL | 0 refills | Status: DC | PRN
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
## 2019-03-08 ENCOUNTER — Other Ambulatory Visit (HOSPITAL_COMMUNITY): Payer: Self-pay | Admitting: Internal Medicine

## 2019-03-08 DIAGNOSIS — I5022 Chronic systolic (congestive) heart failure: Secondary | ICD-10-CM

## 2019-03-08 DIAGNOSIS — I10 Essential (primary) hypertension: Secondary | ICD-10-CM

## 2019-04-03 ENCOUNTER — Other Ambulatory Visit: Payer: Self-pay

## 2019-04-08 ENCOUNTER — Ambulatory Visit: Payer: No Typology Code available for payment source | Admitting: Endocrinology

## 2019-05-18 ENCOUNTER — Other Ambulatory Visit (HOSPITAL_COMMUNITY): Payer: Self-pay | Admitting: Internal Medicine

## 2019-05-18 DIAGNOSIS — I10 Essential (primary) hypertension: Secondary | ICD-10-CM

## 2019-05-18 DIAGNOSIS — I5022 Chronic systolic (congestive) heart failure: Secondary | ICD-10-CM

## 2019-06-15 ENCOUNTER — Other Ambulatory Visit: Payer: Self-pay | Admitting: Family Medicine

## 2019-07-19 ENCOUNTER — Other Ambulatory Visit (HOSPITAL_COMMUNITY): Payer: Self-pay | Admitting: Internal Medicine

## 2019-07-19 ENCOUNTER — Other Ambulatory Visit: Payer: Self-pay | Admitting: Family Medicine

## 2019-07-22 ENCOUNTER — Telehealth (HOSPITAL_COMMUNITY): Payer: Self-pay | Admitting: Pharmacy Technician

## 2019-07-22 NOTE — Telephone Encounter (Signed)
Advanced Heart Failure Patient Advocate Encounter  Prior Authorization for Delene Loll has been approved.    PA#  [id number] Effective dates: 07/22/2019 through 07/21/2022  Patients co-pay is $200.00  Called and spoke with patient regarding co-pay. He thinks he has enough to get him until January, will call and let me know if he needs assistance at that time.  Charlann Boxer, CPhT

## 2019-07-29 ENCOUNTER — Inpatient Hospital Stay (HOSPITAL_BASED_OUTPATIENT_CLINIC_OR_DEPARTMENT_OTHER)
Admission: EM | Admit: 2019-07-29 | Discharge: 2019-07-31 | DRG: 292 | Disposition: A | Discharge status: Home / Self Care | Payer: No Typology Code available for payment source | Attending: Internal Medicine | Admitting: Internal Medicine

## 2019-07-29 ENCOUNTER — Other Ambulatory Visit: Payer: Self-pay

## 2019-07-29 ENCOUNTER — Emergency Department (HOSPITAL_BASED_OUTPATIENT_CLINIC_OR_DEPARTMENT_OTHER): Payer: No Typology Code available for payment source

## 2019-07-29 ENCOUNTER — Encounter (HOSPITAL_BASED_OUTPATIENT_CLINIC_OR_DEPARTMENT_OTHER): Payer: Self-pay | Admitting: Emergency Medicine

## 2019-07-29 DIAGNOSIS — I2510 Atherosclerotic heart disease of native coronary artery without angina pectoris: Secondary | ICD-10-CM | POA: Diagnosis present

## 2019-07-29 DIAGNOSIS — Z833 Family history of diabetes mellitus: Secondary | ICD-10-CM

## 2019-07-29 DIAGNOSIS — Z20828 Contact with and (suspected) exposure to other viral communicable diseases: Secondary | ICD-10-CM | POA: Diagnosis present

## 2019-07-29 DIAGNOSIS — I639 Cerebral infarction, unspecified: Secondary | ICD-10-CM | POA: Diagnosis present

## 2019-07-29 DIAGNOSIS — F4310 Post-traumatic stress disorder, unspecified: Secondary | ICD-10-CM | POA: Diagnosis present

## 2019-07-29 DIAGNOSIS — R0602 Shortness of breath: Secondary | ICD-10-CM

## 2019-07-29 DIAGNOSIS — Z8249 Family history of ischemic heart disease and other diseases of the circulatory system: Secondary | ICD-10-CM

## 2019-07-29 DIAGNOSIS — I10 Essential (primary) hypertension: Secondary | ICD-10-CM | POA: Diagnosis present

## 2019-07-29 DIAGNOSIS — I509 Heart failure, unspecified: Secondary | ICD-10-CM

## 2019-07-29 DIAGNOSIS — I110 Hypertensive heart disease with heart failure: Principal | ICD-10-CM | POA: Diagnosis present

## 2019-07-29 DIAGNOSIS — G4733 Obstructive sleep apnea (adult) (pediatric): Secondary | ICD-10-CM | POA: Diagnosis present

## 2019-07-29 DIAGNOSIS — I428 Other cardiomyopathies: Secondary | ICD-10-CM | POA: Diagnosis present

## 2019-07-29 DIAGNOSIS — F319 Bipolar disorder, unspecified: Secondary | ICD-10-CM | POA: Diagnosis present

## 2019-07-29 DIAGNOSIS — I248 Other forms of acute ischemic heart disease: Secondary | ICD-10-CM | POA: Diagnosis present

## 2019-07-29 DIAGNOSIS — I5043 Acute on chronic combined systolic (congestive) and diastolic (congestive) heart failure: Secondary | ICD-10-CM | POA: Diagnosis present

## 2019-07-29 DIAGNOSIS — I5023 Acute on chronic systolic (congestive) heart failure: Secondary | ICD-10-CM | POA: Diagnosis present

## 2019-07-29 DIAGNOSIS — Z8673 Personal history of transient ischemic attack (TIA), and cerebral infarction without residual deficits: Secondary | ICD-10-CM

## 2019-07-29 DIAGNOSIS — I252 Old myocardial infarction: Secondary | ICD-10-CM

## 2019-07-29 DIAGNOSIS — Z6841 Body Mass Index (BMI) 40.0 and over, adult: Secondary | ICD-10-CM

## 2019-07-29 DIAGNOSIS — Z79899 Other long term (current) drug therapy: Secondary | ICD-10-CM

## 2019-07-29 DIAGNOSIS — Z794 Long term (current) use of insulin: Secondary | ICD-10-CM

## 2019-07-29 DIAGNOSIS — I447 Left bundle-branch block, unspecified: Secondary | ICD-10-CM | POA: Diagnosis present

## 2019-07-29 DIAGNOSIS — E785 Hyperlipidemia, unspecified: Secondary | ICD-10-CM | POA: Diagnosis present

## 2019-07-29 DIAGNOSIS — R9431 Abnormal electrocardiogram [ECG] [EKG]: Secondary | ICD-10-CM

## 2019-07-29 DIAGNOSIS — Z7902 Long term (current) use of antithrombotics/antiplatelets: Secondary | ICD-10-CM

## 2019-07-29 DIAGNOSIS — Z7982 Long term (current) use of aspirin: Secondary | ICD-10-CM

## 2019-07-29 DIAGNOSIS — Z886 Allergy status to analgesic agent status: Secondary | ICD-10-CM

## 2019-07-29 LAB — CBC WITH DIFFERENTIAL/PLATELET
Abs Immature Granulocytes: 0.05 10*3/uL (ref 0.00–0.07)
Basophils Absolute: 0 10*3/uL (ref 0.0–0.1)
Basophils Relative: 0 %
Eosinophils Absolute: 0.1 10*3/uL (ref 0.0–0.5)
Eosinophils Relative: 1 %
HCT: 36.5 % — ABNORMAL LOW (ref 39.0–52.0)
Hemoglobin: 11.8 g/dL — ABNORMAL LOW (ref 13.0–17.0)
Immature Granulocytes: 1 %
Lymphocytes Relative: 10 %
Lymphs Abs: 1.1 10*3/uL (ref 0.7–4.0)
MCH: 29.6 pg (ref 26.0–34.0)
MCHC: 32.3 g/dL (ref 30.0–36.0)
MCV: 91.7 fL (ref 80.0–100.0)
Monocytes Absolute: 0.7 10*3/uL (ref 0.1–1.0)
Monocytes Relative: 7 %
Neutro Abs: 8.3 10*3/uL — ABNORMAL HIGH (ref 1.7–7.7)
Neutrophils Relative %: 81 %
Platelets: 177 10*3/uL (ref 150–400)
RBC: 3.98 MIL/uL — ABNORMAL LOW (ref 4.22–5.81)
RDW: 12.8 % (ref 11.5–15.5)
WBC: 10.3 10*3/uL (ref 4.0–10.5)
nRBC: 0 % (ref 0.0–0.2)

## 2019-07-29 LAB — COMPREHENSIVE METABOLIC PANEL
ALT: 31 U/L (ref 0–44)
AST: 22 U/L (ref 15–41)
Albumin: 3.3 g/dL — ABNORMAL LOW (ref 3.5–5.0)
Alkaline Phosphatase: 79 U/L (ref 38–126)
Anion gap: 11 (ref 5–15)
BUN: 18 mg/dL (ref 6–20)
CO2: 26 mmol/L (ref 22–32)
Calcium: 8.4 mg/dL — ABNORMAL LOW (ref 8.9–10.3)
Chloride: 101 mmol/L (ref 98–111)
Creatinine, Ser: 1.03 mg/dL (ref 0.61–1.24)
GFR calc Af Amer: >60 mL/min (ref >60)
GFR calc non Af Amer: >60 mL/min (ref >60)
Glucose, Bld: 339 mg/dL — ABNORMAL HIGH (ref 70–99)
Potassium: 3.5 mmol/L (ref 3.5–5.1)
Sodium: 138 mmol/L (ref 135–145)
Total Bilirubin: 0.6 mg/dL (ref 0.3–1.2)
Total Protein: 7.1 g/dL (ref 6.5–8.1)

## 2019-07-29 LAB — TROPONIN I (HIGH SENSITIVITY)
Troponin I (High Sensitivity): 21 ng/L — ABNORMAL HIGH (ref <18)
Troponin I (High Sensitivity): 22 ng/L — ABNORMAL HIGH (ref <18)

## 2019-07-29 LAB — BRAIN NATRIURETIC PEPTIDE: B Natriuretic Peptide: 285 pg/mL — ABNORMAL HIGH (ref 0.0–100.0)

## 2019-07-29 LAB — SARS CORONAVIRUS 2 AG (30 MIN TAT): SARS Coronavirus 2 Ag: NEGATIVE

## 2019-07-29 MED ORDER — FUROSEMIDE 10 MG/ML IJ SOLN
80.0000 mg | Freq: Once | INTRAMUSCULAR | Status: AC
  Administered 2019-07-29: 80 mg via INTRAVENOUS
  Filled 2019-07-29: qty 8

## 2019-07-29 NOTE — ED Notes (Signed)
Explained delay to pt on getting him transferred to [facility]  Verbalized an understanding

## 2019-07-29 NOTE — ED Notes (Signed)
ED TO INPATIENT HANDOFF REPORT  ED Nurse Name and Phone #: Lattie Haw [phone]  S Name/Age/Gender [name] 48 y.o. male Room/Bed: MH06/MH06  Code Status   Code Status: Prior  Home/SNF/Other Home Patient oriented to: self, place, time and situation Is this baseline? Yes   Triage Complete: Triage complete  Chief Complaint Acute on chronic systolic congestive heart failure (HCC) [I50.23]  Triage Note SOB x 2 days. Hx of CHF. Unable to speak in complete sentences.     Allergies Allergies  Allergen Reactions  . Nsaids Anaphylaxis and Other (See Comments)    Able to tolerate aspirin     Level of Care/Admitting Diagnosis ED Disposition    ED Disposition Condition Comment   Admit  Hospital Area: [place] [[id number]]  Level of Care: Telemetry [5]  Admit to tele based on following criteria: Acute CHF  Covid Evaluation: Asymptomatic Screening Protocol (No Symptoms)  Diagnosis: Acute on chronic systolic congestive heart failure [facility]) [[id number]]  Admitting Physician: Lenore Cordia [[id number]]  Attending Physician: Lenore Cordia [[id number]]       B Medical/Surgery History Past Medical History:  Diagnosis Date  . Bipolar disorder ([place])   . CHF (congestive heart failure) ([place])   . Chronic systolic heart failure ([place]) 11/2010   a. NICM 4/14 EF 20-25%, TR mild  . CVA (cerebral infarction)    No residual deficits  . Depression    PTSD,   . Diabetes mellitus    TYPE II; UNCONTROLLED BY HEMOGLOBIN A1c; STABLE AS  PER DISCHARGE  . Headache(784.0)   . 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 History of colonic polyps   . Hyperlipidemia   . Hypertension   . Myocardial infarction ([place]) 1987   (while playing football)  . Non-ischemic cardiomyopathy (HCC)    No ischemia on myoview, showed EF of 42%. 2D echo showed EF 79-15% with diatolic dysfunction in 0569  . Obesity   . OSA (obstructive sleep apnea)    Being set up again for c-pap. Pt sts he does not  use a cpap, and the test was negative.  . Pneumonia   . Post-cardiac injury syndrome ([place])    History of cardiac injury from blunt trauma  . PVCs (premature ventricular contractions)   . Schizophrenia ([place])    Goes to [facility]  . SOB (shortness of breath)   . Stroke [facility]) 2005   some left side weakness  . Syncope    Recurrent, thought to be vasovagal. Also has h/o frequent PVCs.    Past Surgical History:  Procedure Laterality Date  . CARDIAC CATHETERIZATION  12/19/10   DIFFUSE NONOBSTRUCTIVE CAD; NONISCHEMIC CARDIOMYOPATHY; LEFT VENTRICULAR ANGIOGRAM WAS PERFORMED SECONDARY TO  ELEVATED LEFT VENTRICULAR FILLING PRESSURES  . COLONOSCOPY W/ POLYPECTOMY    . METATARSAL HEAD EXCISION Right 08/29/2018   Procedure: METATARSAL HEAD RESECTION;  Surgeon: Edrick Kins, DPM;  Location: [place];  Service: Podiatry;  Laterality: Right;  . METATARSAL OSTEOTOMY Right 08/29/2018   Procedure: SUB FIFTHE METATARSIA RIGHT FOOT;  Surgeon: Edrick Kins, DPM;  Location: [place];  Service: Podiatry;  Laterality: Right;  . MULTIPLE EXTRACTIONS WITH ALVEOLOPLASTY  01/27/2014   "all my teeth; 4 Quadrants of alveoloplasty  . MULTIPLE EXTRACTIONS WITH ALVEOLOPLASTY N/A 01/27/2014   Procedure: EXTRACTION OF TEETH #'1, 2, 3, 4, 5, 6, 7, 8, 9, 10, 11, 12, 13, 14, 15, 16, 17, 20, 21, 22, 23, 24, 25, 26, 27, 28, 29, 31 and 32  WITH ALVEOLOPLASTY;  Surgeon: Lenn Cal, DDS;  Location: [place];  Service: Oral Surgery;  Laterality: N/A;  . ORIF FINGER / THUMB FRACTURE Right   . RIGHT/LEFT HEART CATH AND CORONARY ANGIOGRAPHY N/A 09/26/2017   Procedure: RIGHT/LEFT HEART CATH AND CORONARY ANGIOGRAPHY;  Surgeon: Jolaine Artist, MD;  Location: [place] CV LAB;  Service: Cardiovascular;  Laterality: N/A;  . WOUND DEBRIDEMENT Right 08/29/2018   Procedure: Debridement of ulcer on right fifth metatarsal;  Surgeon: Edrick Kins, DPM;  Location: [facility];  Service: Podiatry;  Laterality: Right;     A IV  Location/Drains/Wounds Patient Lines/Drains/Airways Status   Active Line/Drains/Airways    Name:   Placement date:   Placement time:   Site:   Days:   Peripheral IV 07/29/19 Right Antecubital   07/29/19    1707    Antecubital   less than 1   Incision (Closed) 08/29/18 Foot Right   08/29/18    0759     334   Wound / Incision (Open or Dehisced) 05/08/17 Diabetic ulcer Foot Posterior closed looks llike calius but pt report ulcer   05/08/17    1956    Foot   812          Intake/Output Last 24 hours No intake or output data in the 24 hours ending 07/29/19 2018  Labs/Imaging Results for orders placed or performed during the hospital encounter of 07/29/19 (from the past 48 hour(s))  Comprehensive metabolic panel     Status: Abnormal   Collection Time: 07/29/19  5:08 PM  Result Value Ref Range   Sodium 138 135 - 145 mmol/L   Potassium 3.5 3.5 - 5.1 mmol/L   Chloride 101 98 - 111 mmol/L   CO2 26 22 - 32 mmol/L   Glucose, Bld 339 (H) 70 - 99 mg/dL   BUN 18 6 - 20 mg/dL   Creatinine, Ser 1.03 0.61 - 1.24 mg/dL   Calcium 8.4 (L) 8.9 - 10.3 mg/dL   Total Protein 7.1 6.5 - 8.1 g/dL   Albumin 3.3 (L) 3.5 - 5.0 g/dL   AST 22 15 - 41 U/L   ALT 31 0 - 44 U/L   Alkaline Phosphatase 79 38 - 126 U/L   Total Bilirubin 0.6 0.3 - 1.2 mg/dL   GFR calc non Af Amer >60 >60 mL/min   GFR calc Af Amer >60 >60 mL/min   Anion gap 11 5 - 15    Comment: Performed at [facility], [address] [place], [place] [address]  CBC with Differential     Status: Abnormal   Collection Time: 07/29/19  5:08 PM  Result Value Ref Range   WBC 10.3 4.0 - 10.5 K/uL   RBC 3.98 (L) 4.22 - 5.81 MIL/uL   Hemoglobin 11.8 (L) 13.0 - 17.0 g/dL   HCT 36.5 (L) 39.0 - 52.0 %   MCV 91.7 80.0 - 100.0 fL   MCH 29.6 26.0 - 34.0 pg   MCHC 32.3 30.0 - 36.0 g/dL   RDW 12.8 11.5 - 15.5 %   Platelets 177 150 - 400 K/uL   nRBC 0.0 0.0 - 0.2 %   Neutrophils Relative % 81 %   Neutro Abs 8.3 (H) 1.7 - 7.7 K/uL    Lymphocytes Relative 10 %   Lymphs Abs 1.1 0.7 - 4.0 K/uL   Monocytes Relative 7 %   Monocytes Absolute 0.7 0.1 - 1.0 K/uL   Eosinophils Relative 1 %  Eosinophils Absolute 0.1 0.0 - 0.5 K/uL   Basophils Relative 0 %   Basophils Absolute 0.0 0.0 - 0.1 K/uL   Immature Granulocytes 1 %   Abs Immature Granulocytes 0.05 0.00 - 0.07 K/uL    Comment: Performed at [facility], [address] [place], [place] [address]  Brain natriuretic peptide     Status: Abnormal   Collection Time: 07/29/19  5:09 PM  Result Value Ref Range   B Natriuretic Peptide 285.0 (H) 0.0 - 100.0 pg/mL    Comment: Performed at [facility], [address] [place], [place] [address]  Troponin I (High Sensitivity)     Status: Abnormal   Collection Time: 07/29/19  5:09 PM  Result Value Ref Range   Troponin I (High Sensitivity) 21 (H) <18 ng/L    Comment: (NOTE) Elevated high sensitivity troponin I (hsTnI) values and significant  changes across serial measurements may suggest ACS but many other  chronic and acute conditions are known to elevate hsTnI results.  Refer to the "Links" section for chest pain algorithms and additional  guidance. Performed at [facility], [address] [place], [place] [address]   SARS Coronavirus 2 Ag (30 min TAT) - Nasal Swab (BD Veritor Kit)     Status: None   Collection Time: 07/29/19  5:09 PM   Specimen: Nasal Swab (BD Veritor Kit)  Result Value Ref Range   SARS Coronavirus 2 Ag NEGATIVE NEGATIVE    Comment: (NOTE) SARS-CoV-2 antigen NOT DETECTED.  Negative results are presumptive.  Negative results do not preclude SARS-CoV-2 infection and should not be used as the sole basis for treatment or other patient management decisions, including infection  control decisions, particularly in the presence of clinical signs and  symptoms consistent with COVID-19, or in those who have been in contact with the virus.  Negative results must be combined  with clinical observations, patient history, and epidemiological information. The expected result is Negative. Fact Sheet for Patients: PodPark.tn Fact Sheet for Healthcare Providers: GiftContent.is This test is not yet approved or cleared by the [place] FDA and  has been authorized for detection and/or diagnosis of SARS-CoV-2 by FDA under an Emergency Use Authorization (EUA).  This EUA will remain in effect (meaning this test can be used) for the duration of  the COVID-19 de claration under Section 564(b)(1) of the Act, 21 U.S.C. section 360bbb-3(b)(1), unless the authorization is terminated or revoked sooner. Performed at [facility], [address] [place], [place] [address]   Troponin I (High Sensitivity)     Status: Abnormal   Collection Time: 07/29/19  6:44 PM  Result Value Ref Range   Troponin I (High Sensitivity) 22 (H) <18 ng/L    Comment: (NOTE) Elevated high sensitivity troponin I (hsTnI) values and significant  changes across serial measurements may suggest ACS but many other  chronic and acute conditions are known to elevate hsTnI results.  Refer to the "Links" section for chest pain algorithms and additional  guidance. Performed at [facility], [address], [place], [place] [address]    DG Chest Portable 1 View  Result Date: 07/29/2019 CLINICAL DATA:  48 year old male with shortness of breath. EXAM: PORTABLE CHEST 1 VIEW COMPARISON:  Chest radiograph dated 05/08/2017. FINDINGS: There is cardiomegaly with vascular congestion and probable mild edema. No focal consolidation, pleural effusion, or pneumothorax. No acute osseous pathology. IMPRESSION: Cardiomegaly with findings of mild congestive  heart failure. No focal consolidation. Electronically Signed   By: Anner Crete M.D.   On: 07/29/2019 17:03    Pending Labs Unresulted Labs (From admission, onward)    Start      Ordered   07/29/19 1813  SARS CORONAVIRUS 2 (TAT 6-24 HRS) Nasopharyngeal Nasopharyngeal Swab  (Tier 3 (TAT 6-24 hrs))  Once,   STAT    Question Answer Comment  Is this test for diagnosis or screening Screening   Symptomatic for COVID-19 as defined by CDC No   Hospitalized for COVID-19 No   Admitted to ICU for COVID-19 No   Previously tested for COVID-19 Yes   Resident in a congregate (group) care setting No   Employed in healthcare setting No      07/29/19 1813          Vitals/Pain Today's Vitals   07/29/19 1829 07/29/19 1900 07/29/19 1930 07/29/19 2002  BP: (!) 146/94 (!) 135/102 (!) 144/117 (!) 153/94  Pulse: (!) 108 (!) 105 (!) 110 (!) 108  Resp: 15   18  Temp:      TempSrc:      SpO2: 98% 99% 90% 98%  Weight:      Height:      PainSc:        Isolation Precautions Airborne and Contact precautions  Medications Medications  furosemide (LASIX) injection 80 mg (80 mg Intravenous Given 07/29/19 1856)    Mobility walks Low fall risk   Focused Assessments Pulmonary Assessment Handoff:  Lung sounds:   O2 Device: Room Air        R Recommendations: See Admitting Provider Note  Report given to:   Additional Notes: none

## 2019-07-29 NOTE — ED Provider Notes (Signed)
[facility] EMERGENCY DEPARTMENT Provider Note   CSN: [id number] Arrival date & time: 07/29/19  1547     History Chief Complaint  Patient presents with  . Shortness of Breath    Mitchell [name] is a 48 y.o. male with history of CHF, diabetes, hypertension who presents with shortness of breath that has been worsening over the past 5 to 6 days.  He reports it feels like his CHF exacerbations in the past.  He cannot lie flat.  He has been taking his home Lasix, 60 mg daily, without relief.  He has gained 15 pounds in the last few days.  He has had some abdominal fluid retention as well as mild bilateral peripheral edema.  He denies any fever.  He has had some cough when he is short of breath.  HPI     Past Medical History:  Diagnosis Date  . Bipolar disorder ([place])   . CHF (congestive heart failure) ([place])   . Chronic systolic heart failure ([place]) 11/2010   a. NICM 4/14 EF 20-25%, TR mild  . CVA (cerebral infarction)    No residual deficits  . Depression    PTSD,   . Diabetes mellitus    TYPE II; UNCONTROLLED BY HEMOGLOBIN A1c; STABLE AS  PER DISCHARGE  . Headache(784.0)   . 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 History of colonic polyps   . Hyperlipidemia   . Hypertension   . Myocardial infarction ([place]) 1987   (while playing football)  . Non-ischemic cardiomyopathy (HCC)    No ischemia on myoview, showed EF of 42%. 2D echo showed EF 40-98% with diatolic dysfunction in 1191  . Obesity   . OSA (obstructive sleep apnea)    Being set up again for c-pap. Pt sts he does not use a cpap, and the test was negative.  . Pneumonia   . Post-cardiac injury syndrome ([place])    History of cardiac injury from blunt trauma  . PVCs (premature ventricular contractions)   . Schizophrenia ([place])    Goes to [facility]  . SOB (shortness of breath)   . Stroke [facility]) 2005   some left side weakness  . Syncope    Recurrent, thought to be vasovagal. Also has h/o frequent  PVCs.     Patient Active Problem List   Diagnosis Date Noted  . Foot ulcer ([place]) 03/11/2018  . Weakness 05/08/2017  . Leukocytosis 05/08/2017  . Stroke ([place]) 05/08/2017  . Slurred speech 05/08/2017  . Noncompliance 01/22/2014  . Chronic systolic heart failure ([place]) 10/02/2011  . Erectile dysfunction 10/02/2011  . Obstructive sleep apnea 10/11/2007  . Diabetes mellitus with complication ([place]) 47/82/9562  . HLD (hyperlipidemia) 10/10/2007  . HYPOKALEMIA 10/10/2007  . OBESITY 10/10/2007  . Essential hypertension 10/10/2007  . PREMATURE VENTRICULAR CONTRACTIONS 10/10/2007  . SYNCOPE 10/10/2007  . COLONIC POLYPS, HX OF 10/10/2007    Past Surgical History:  Procedure Laterality Date  . CARDIAC CATHETERIZATION  12/19/10   DIFFUSE NONOBSTRUCTIVE CAD; NONISCHEMIC CARDIOMYOPATHY; LEFT VENTRICULAR ANGIOGRAM WAS PERFORMED SECONDARY TO  ELEVATED LEFT VENTRICULAR FILLING PRESSURES  . COLONOSCOPY W/ POLYPECTOMY    . METATARSAL HEAD EXCISION Right 08/29/2018   Procedure: METATARSAL HEAD RESECTION;  Surgeon: Edrick Kins, DPM;  Location: [facility];  Service: Podiatry;  Laterality: Right;  . METATARSAL OSTEOTOMY Right 08/29/2018   Procedure: SUB FIFTHE METATARSIA RIGHT FOOT;  Surgeon: Edrick Kins, DPM;  Location: [facility];  Service: Podiatry;  Laterality: Right;  . MULTIPLE  EXTRACTIONS WITH ALVEOLOPLASTY  01/27/2014   "all my teeth; 4 Quadrants of alveoloplasty  . MULTIPLE EXTRACTIONS WITH ALVEOLOPLASTY N/A 01/27/2014   Procedure: EXTRACTION OF TEETH #'1, 2, 3, 4, 5, 6, 7, 8, 9, 10, 11, 12, 13, 14, 15, 16, 17, 20, 21, 22, 23, 24, 25, 26, 27, 28, 29, 31 and 32 WITH ALVEOLOPLASTY;  Surgeon: Lenn Cal, DDS;  Location: [facility];  Service: Oral Surgery;  Laterality: N/A;  . ORIF FINGER / THUMB FRACTURE Right   . RIGHT/LEFT HEART CATH AND CORONARY ANGIOGRAPHY N/A 09/26/2017   Procedure: RIGHT/LEFT HEART CATH AND CORONARY ANGIOGRAPHY;  Surgeon: Jolaine Artist, MD;  Location: [place] CV LAB;   Service: Cardiovascular;  Laterality: N/A;  . WOUND DEBRIDEMENT Right 08/29/2018   Procedure: Debridement of ulcer on right fifth metatarsal;  Surgeon: Edrick Kins, DPM;  Location: [facility];  Service: Podiatry;  Laterality: Right;       Family History  Problem Relation Age of Onset  . Heart disease Mother        MI  . Heart failure Mother   . Diabetes Mother        ALSO IN MOST OF HIS SIBLINGS; 2 UNLCES HAVE ALSO PASSED AWAY FROM DM  . Cardiomyopathy Mother   . Cancer - Ovarian Mother   . Heart disease Father   . Hypertension Father   . Diabetes Father   . Diabetes Sister   . Diabetes Brother     Social History   Tobacco Use  . Smoking status: Never Smoker  . Smokeless tobacco: Never Used  Substance Use Topics  . Alcohol use: No  . Drug use: No    Home Medications Prior to Admission medications   Medication Sig Start Date End Date Taking? Authorizing Provider  aspirin 81 MG EC tablet Take 1 tablet (81 mg total) by mouth daily. 05/23/17   [name], [name], MD  atorvastatin (LIPITOR) 40 MG tablet TAKE 2 TABLETS BY MOUTH AT BEDTIME Patient taking differently: Take 80 mg by mouth at bedtime.  05/29/18   [name], [name], MD  Blood Glucose Monitoring Suppl (ACCU-CHEK GUIDE ME) w/Device KIT 1 each by Does not apply route 2 (two) times daily. Use to monitor glucose levels BID 06/07/18   Renato Shin, MD  carvedilol (COREG) 6.25 MG tablet TAKE 1 & 1/2 (ONE & ONE-HALF) TABLETS BY MOUTH TWICE DAILY WITH A MEAL 05/19/19   Bensimhon, Shaune Pascal, MD  cetirizine (ZYRTEC) 10 MG tablet Take 1 tablet by mouth once daily 09/30/18   [name], [name], MD  clopidogrel (PLAVIX) 75 MG tablet TAKE 1 TABLET BY MOUTH ONCE DAILY Patient taking differently: Take 75 mg by mouth daily.  05/29/18   [name], [name], MD  digoxin (LANOXIN) 0.125 MG tablet Take 1 tablet (0.125 mg total) by mouth daily. 11/04/18   [name], [name], MD  ENTRESTO 97-103 MG Take 1 tablet by mouth twice daily 07/21/19    Bensimhon, Shaune Pascal, MD  fluticasone [facility]) 50 MCG/ACT nasal spray USE 2 SPRAY(S) IN EACH NOSTRIL DAILY 10/01/18   [name], [name], MD  furosemide (LASIX) 40 MG tablet TAKE 1 & 1/2 (ONE & ONE-HALF) TABLETS BY MOUTH DAILY 07/21/19   [name], [name], MD  glucose blood (ACCU-CHEK GUIDE) test strip Use to monitor glucose levels BID 06/07/18   Renato Shin, MD  HYDROcodone-acetaminophen (NORCO/VICODIN) 5-325 MG tablet Take 1-2 tablets by mouth every 4 (four) hours as needed. 02/28/19   Malvin Johns, MD  Insulin Glargine (BASAGLAR KWIKPEN) 100 UNIT/ML SOPN Inject 1.5 mLs (150 Units total) into the skin every morning. And pen needles 1/day 02/04/19   Renato Shin, MD  Lancets 28G MISC Use for glucose testing up to BID 11/02/16   [name], [name], MD  Lancets Misc. (ACCU-CHEK FASTCLIX LANCET) KIT Use to monitor glucose levels BID 06/07/18   Renato Shin, MD  Potassium Chloride ER 20 MEQ TBCR Take 1 tablet by mouth once daily 03/10/19   Bensimhon, Shaune Pascal, MD    Allergies    Nsaids  Review of Systems   Review of Systems  Constitutional: Negative for chills and fever.  HENT: Negative for facial swelling and sore throat.   Respiratory: Positive for cough, chest tightness and shortness of breath.   Cardiovascular: Positive for chest pain (described as tightness).  Gastrointestinal: Negative for abdominal pain, nausea and vomiting.  Genitourinary: Negative for dysuria.  Musculoskeletal: Negative for back pain.  Skin: Negative for rash and wound.  Neurological: Negative for headaches.  Psychiatric/Behavioral: The patient is not nervous/anxious.     Physical Exam Updated Vital Signs BP (!) 150/87 (BP Location: Right Arm)   Pulse (!) 123   Temp 98.6 F (37 C) (Oral)   Resp 14   Ht 5' 11.5" (1.816 m)   Wt (!) 137 kg   SpO2 93%   BMI 41.53 kg/m   Physical Exam Vitals and nursing note reviewed.  Constitutional:      General: He is not in acute distress.    Appearance: He is  well-developed. He is obese. He is not diaphoretic.  HENT:     Head: Normocephalic and atraumatic.     Mouth/Throat:     Pharynx: No oropharyngeal exudate.  Eyes:     General: No scleral icterus.       Right eye: No discharge.        Left eye: No discharge.     Conjunctiva/sclera: Conjunctivae normal.     Pupils: Pupils are equal, round, and reactive to light.  Neck:     Thyroid: No thyromegaly.  Cardiovascular:     Rate and Rhythm: Normal rate and regular rhythm.     Heart sounds: Normal heart sounds. No murmur. No friction rub. No gallop.   Pulmonary:     Effort: Pulmonary effort is normal. No respiratory distress.     Breath sounds: No stridor. Decreased breath sounds present. No wheezing or rales.  Abdominal:     General: Bowel sounds are normal. There is no distension.     Palpations: Abdomen is soft.     Tenderness: There is no abdominal tenderness. There is no guarding or rebound.  Musculoskeletal:     Cervical back: Normal range of motion and neck supple.     Right lower leg: Edema (1+) present.     Left lower leg: Edema (1+) present.  Lymphadenopathy:     Cervical: No cervical adenopathy.  Skin:    General: Skin is warm and dry.     Coloration: Skin is not pale.     Findings: No rash.  Neurological:     Mental Status: He is alert.     Coordination: Coordination normal.     ED Results / Procedures / Treatments   Labs (all labs ordered are listed, but only abnormal results are displayed) Labs Reviewed  COMPREHENSIVE METABOLIC PANEL - Abnormal; Notable for the following components:      Result Value   Glucose, Bld 339 (*)    Calcium  8.4 (*)    Albumin 3.3 (*)    All other components within normal limits  BRAIN NATRIURETIC PEPTIDE - Abnormal; Notable for the following components:   B Natriuretic Peptide 285.0 (*)    All other components within normal limits  CBC WITH DIFFERENTIAL/PLATELET - Abnormal; Notable for the following components:   RBC 3.98 (*)     Hemoglobin 11.8 (*)    HCT 36.5 (*)    Neutro Abs 8.3 (*)    All other components within normal limits  TROPONIN I (HIGH SENSITIVITY) - Abnormal; Notable for the following components:   Troponin I (High Sensitivity) 21 (*)    All other components within normal limits  SARS CORONAVIRUS 2 AG (30 MIN TAT)  TROPONIN I (HIGH SENSITIVITY)    EKG EKG Interpretation  Date/Time:  Tuesday July 29 2019 16:04:40 EST Ventricular Rate:  106 PR Interval:    QRS Duration: 120 QT Interval:  384 QTC Calculation: 510 R Axis:   -62 Text Interpretation: Sinus tachycardia Left bundle branch block no acute changes when compared to prior ecg on Aug 2019 Confirmed by Madalyn Rob [phone]) on 07/29/2019 4:14:15 PM   Radiology DG Chest Portable 1 View  Result Date: 07/29/2019 CLINICAL DATA:  48 year old male with shortness of breath. EXAM: PORTABLE CHEST 1 VIEW COMPARISON:  Chest radiograph dated 05/08/2017. FINDINGS: There is cardiomegaly with vascular congestion and probable mild edema. No focal consolidation, pleural effusion, or pneumothorax. No acute osseous pathology. IMPRESSION: Cardiomegaly with findings of mild congestive heart failure. No focal consolidation. Electronically Signed   By: Anner Crete M.D.   On: 07/29/2019 17:03    Procedures Procedures (including critical care time)  Medications Ordered in ED Medications  furosemide (LASIX) injection 80 mg (has no administration in time range)    ED Course  I have reviewed the triage vital signs and the nursing notes.  Pertinent labs & imaging results that were available during my care of the patient were reviewed by me and considered in my medical decision making (see chart for details).    MDM Rules/Calculators/A&P                      Patient presenting with worsening shortness of breath over the last several days.  It is worse on exertion.  He has had a 15 pound weight gain in the last few days.  Suspect CHF exacerbation.   Patient feels similar to past exacerbations.  Chest x-ray shows mild CHF with cardiomegaly.  Troponin 21, BNP 285.  Screening Covid negative. At shift change, patient care transferred to [place] Robinson, PA-C for continued evaluation and plan for admission. Will give Lasix.    Final Clinical Impression(s) / ED Diagnoses Final diagnoses:  Shortness of breath    Rx / DC Orders ED Discharge Orders    None       Frederica Kuster, PA-C 07/29/19 1806    Lucrezia Starch, MD 08/06/19 1316

## 2019-07-29 NOTE — ED Notes (Signed)
Pt on monitor 

## 2019-07-29 NOTE — ED Triage Notes (Addendum)
SOB x 2 days. Hx of CHF. Unable to speak in complete sentences.

## 2019-07-29 NOTE — ED Provider Notes (Signed)
Care assumed at shift change from [place], [place], pending labs and admission. See her note for full HPI and workup. Briefly, pt presenting with symptoms consistent with typical CHF exacerbation, worsening over the last 5-6 days.  Associated dyspnea on exertion, orthopnea, 15 pound weight gain in the last few days.  Also reports fluid retention in his abdomen and leg swelling.  Exam is consistent with fluid overload and patient will likely need admission for CHF exacerbation given increased work of breathing and inability to exert himself due to shortness of breath.  No infectious symptoms.  Physical Exam  BP (!) 150/87 (BP Location: Right Arm)   Pulse (!) 123   Temp 98.6 F (37 C) (Oral)   Resp 14   Ht 5' 11.5" (1.816 m)   Wt (!) 137 kg   SpO2 93%   BMI 41.53 kg/m   Physical Exam Vitals and nursing note reviewed.  Constitutional:      General: He is not in acute distress.    Appearance: He is well-developed.  HENT:     Head: Normocephalic and atraumatic.  Eyes:     Conjunctiva/sclera: Conjunctivae normal.  Cardiovascular:     Rate and Rhythm: Tachycardia present.  Pulmonary:     Effort: Pulmonary effort is normal. No respiratory distress.     Breath sounds: Wheezing present.  Neurological:     Mental Status: He is alert.  Psychiatric:        Mood and Affect: Mood normal.        Behavior: Behavior normal.     Results for orders placed or performed during the hospital encounter of 07/29/19  SARS Coronavirus 2 Ag (30 min TAT) - Nasal Swab (BD Veritor Kit)   Specimen: Nasal Swab (BD Veritor Kit)  Result Value Ref Range   SARS Coronavirus 2 Ag NEGATIVE NEGATIVE  Comprehensive metabolic panel  Result Value Ref Range   Sodium 138 135 - 145 mmol/L   Potassium 3.5 3.5 - 5.1 mmol/L   Chloride 101 98 - 111 mmol/L   CO2 26 22 - 32 mmol/L   Glucose, Bld 339 (H) 70 - 99 mg/dL   BUN 18 6 - 20 mg/dL   Creatinine, Ser 1.03 0.61 - 1.24 mg/dL   Calcium 8.4 (L) 8.9 - 10.3 mg/dL   Total  Protein 7.1 6.5 - 8.1 g/dL   Albumin 3.3 (L) 3.5 - 5.0 g/dL   AST 22 15 - 41 U/L   ALT 31 0 - 44 U/L   Alkaline Phosphatase 79 38 - 126 U/L   Total Bilirubin 0.6 0.3 - 1.2 mg/dL   GFR calc non Af Amer >60 >60 mL/min   GFR calc Af Amer >60 >60 mL/min   Anion gap 11 5 - 15  Brain natriuretic peptide  Result Value Ref Range   B Natriuretic Peptide 285.0 (H) 0.0 - 100.0 pg/mL  CBC with Differential  Result Value Ref Range   WBC 10.3 4.0 - 10.5 K/uL   RBC 3.98 (L) 4.22 - 5.81 MIL/uL   Hemoglobin 11.8 (L) 13.0 - 17.0 g/dL   HCT 36.5 (L) 39.0 - 52.0 %   MCV 91.7 80.0 - 100.0 fL   MCH 29.6 26.0 - 34.0 pg   MCHC 32.3 30.0 - 36.0 g/dL   RDW 12.8 11.5 - 15.5 %   Platelets 177 150 - 400 K/uL   nRBC 0.0 0.0 - 0.2 %   Neutrophils Relative % 81 %   Neutro Abs 8.3 (H) 1.7 - 7.7  K/uL   Lymphocytes Relative 10 %   Lymphs Abs 1.1 0.7 - 4.0 K/uL   Monocytes Relative 7 %   Monocytes Absolute 0.7 0.1 - 1.0 K/uL   Eosinophils Relative 1 %   Eosinophils Absolute 0.1 0.0 - 0.5 K/uL   Basophils Relative 0 %   Basophils Absolute 0.0 0.0 - 0.1 K/uL   Immature Granulocytes 1 %   Abs Immature Granulocytes 0.05 0.00 - 0.07 K/uL  Troponin I (High Sensitivity)  Result Value Ref Range   Troponin I (High Sensitivity) 21 (H) <18 ng/L  Troponin I (High Sensitivity)  Result Value Ref Range   Troponin I (High Sensitivity) 22 (H) <18 ng/L   DG Chest Portable 1 View  Result Date: 07/29/2019 CLINICAL DATA:  48 year old male with shortness of breath. EXAM: PORTABLE CHEST 1 VIEW COMPARISON:  Chest radiograph dated 05/08/2017. FINDINGS: There is cardiomegaly with vascular congestion and probable mild edema. No focal consolidation, pleural effusion, or pneumothorax. No acute osseous pathology. IMPRESSION: Cardiomegaly with findings of mild congestive heart failure. No focal consolidation. Electronically Signed   By: Anner Crete M.D.   On: 07/29/2019 17:03    ED Course/Procedures   Clinical Course as of Jul 28 1838  Tue Jul 29, 2019  1839 Pt evaluated. Discussed labs and plan for admission. IV lasix ordered per previous provider   [JR]    Clinical Course User Index [JR] Veer Elamin, [place] N, PA-C    Procedures  MDM  Pt with hx CHF, presenting with worsening DOE, orthopnea, 15lb weight gain over the last week. Symptoms feel typical of hx prior CHF exacerabtion. Pt on 34m Lasix, though not provided relief of symptoms. IV lasix administerd in the ED. Labs with mild elev trop, however this is likely 2/t to exacerbation of CHF. Pt admitted for CHF exacerbation.     Yasmine Kilbourne, J[place]N, PA-C 07/29/19 2131    DLucrezia Starch MD 07/30/19 [phone]

## 2019-07-30 ENCOUNTER — Inpatient Hospital Stay (HOSPITAL_COMMUNITY): Payer: No Typology Code available for payment source

## 2019-07-30 DIAGNOSIS — G4733 Obstructive sleep apnea (adult) (pediatric): Secondary | ICD-10-CM | POA: Diagnosis present

## 2019-07-30 DIAGNOSIS — F319 Bipolar disorder, unspecified: Secondary | ICD-10-CM | POA: Diagnosis not present

## 2019-07-30 DIAGNOSIS — I2510 Atherosclerotic heart disease of native coronary artery without angina pectoris: Secondary | ICD-10-CM | POA: Diagnosis present

## 2019-07-30 DIAGNOSIS — Z8673 Personal history of transient ischemic attack (TIA), and cerebral infarction without residual deficits: Secondary | ICD-10-CM | POA: Diagnosis not present

## 2019-07-30 DIAGNOSIS — I509 Heart failure, unspecified: Secondary | ICD-10-CM

## 2019-07-30 DIAGNOSIS — R9431 Abnormal electrocardiogram [ECG] [EKG]: Secondary | ICD-10-CM

## 2019-07-30 DIAGNOSIS — I447 Left bundle-branch block, unspecified: Secondary | ICD-10-CM | POA: Diagnosis not present

## 2019-07-30 DIAGNOSIS — I361 Nonrheumatic tricuspid (valve) insufficiency: Secondary | ICD-10-CM

## 2019-07-30 DIAGNOSIS — Z8249 Family history of ischemic heart disease and other diseases of the circulatory system: Secondary | ICD-10-CM | POA: Diagnosis not present

## 2019-07-30 DIAGNOSIS — Z6841 Body Mass Index (BMI) 40.0 and over, adult: Secondary | ICD-10-CM | POA: Diagnosis not present

## 2019-07-30 DIAGNOSIS — E785 Hyperlipidemia, unspecified: Secondary | ICD-10-CM | POA: Diagnosis not present

## 2019-07-30 DIAGNOSIS — Z886 Allergy status to analgesic agent status: Secondary | ICD-10-CM | POA: Diagnosis not present

## 2019-07-30 DIAGNOSIS — I248 Other forms of acute ischemic heart disease: Secondary | ICD-10-CM | POA: Diagnosis not present

## 2019-07-30 DIAGNOSIS — I110 Hypertensive heart disease with heart failure: Secondary | ICD-10-CM | POA: Diagnosis not present

## 2019-07-30 DIAGNOSIS — I340 Nonrheumatic mitral (valve) insufficiency: Secondary | ICD-10-CM

## 2019-07-30 DIAGNOSIS — Z7982 Long term (current) use of aspirin: Secondary | ICD-10-CM | POA: Diagnosis not present

## 2019-07-30 DIAGNOSIS — R0602 Shortness of breath: Secondary | ICD-10-CM | POA: Diagnosis present

## 2019-07-30 DIAGNOSIS — I252 Old myocardial infarction: Secondary | ICD-10-CM | POA: Diagnosis not present

## 2019-07-30 DIAGNOSIS — Z20828 Contact with and (suspected) exposure to other viral communicable diseases: Secondary | ICD-10-CM | POA: Diagnosis not present

## 2019-07-30 DIAGNOSIS — I428 Other cardiomyopathies: Secondary | ICD-10-CM | POA: Diagnosis not present

## 2019-07-30 DIAGNOSIS — Z794 Long term (current) use of insulin: Secondary | ICD-10-CM | POA: Diagnosis not present

## 2019-07-30 DIAGNOSIS — I5023 Acute on chronic systolic (congestive) heart failure: Secondary | ICD-10-CM | POA: Diagnosis not present

## 2019-07-30 DIAGNOSIS — Z833 Family history of diabetes mellitus: Secondary | ICD-10-CM | POA: Diagnosis not present

## 2019-07-30 DIAGNOSIS — F4310 Post-traumatic stress disorder, unspecified: Secondary | ICD-10-CM | POA: Diagnosis not present

## 2019-07-30 DIAGNOSIS — I5043 Acute on chronic combined systolic (congestive) and diastolic (congestive) heart failure: Secondary | ICD-10-CM | POA: Diagnosis not present

## 2019-07-30 DIAGNOSIS — Z7902 Long term (current) use of antithrombotics/antiplatelets: Secondary | ICD-10-CM | POA: Diagnosis not present

## 2019-07-30 DIAGNOSIS — Z79899 Other long term (current) drug therapy: Secondary | ICD-10-CM | POA: Diagnosis not present

## 2019-07-30 LAB — IRON AND TIBC
Iron: 32 ug/dL — ABNORMAL LOW (ref 45–182)
Saturation Ratios: 11 % — ABNORMAL LOW (ref 17.9–39.5)
TIBC: 299 ug/dL (ref 250–450)
UIBC: 267 ug/dL

## 2019-07-30 LAB — MAGNESIUM: Magnesium: 1.8 mg/dL (ref 1.7–2.4)

## 2019-07-30 LAB — RETICULOCYTES
Immature Retic Fract: 24.4 % — ABNORMAL HIGH (ref 2.3–15.9)
RBC.: 4.19 MIL/uL — ABNORMAL LOW (ref 4.22–5.81)
Retic Count, Absolute: 91.8 10*3/uL (ref 19.0–186.0)
Retic Ct Pct: 2.2 % (ref 0.4–3.1)

## 2019-07-30 LAB — VITAMIN B12: Vitamin B-12: 316 pg/mL (ref 180–914)

## 2019-07-30 LAB — FOLATE: Folate: 10.2 ng/mL (ref >5.9)

## 2019-07-30 LAB — HEMOGLOBIN A1C
Hgb A1c MFr Bld: 9.2 % — ABNORMAL HIGH (ref 4.8–5.6)
Mean Plasma Glucose: 217.34 mg/dL

## 2019-07-30 LAB — GLUCOSE, CAPILLARY
Glucose-Capillary: 319 mg/dL — ABNORMAL HIGH (ref 70–99)
Glucose-Capillary: 350 mg/dL — ABNORMAL HIGH (ref 70–99)
Glucose-Capillary: 275 mg/dL — ABNORMAL HIGH (ref 70–99)
Glucose-Capillary: 161 mg/dL — ABNORMAL HIGH (ref 70–99)

## 2019-07-30 LAB — HIV ANTIBODY (ROUTINE TESTING W REFLEX): HIV Screen 4th Generation wRfx: NONREACTIVE

## 2019-07-30 LAB — SARS CORONAVIRUS 2 (TAT 6-24 HRS): SARS Coronavirus 2: NEGATIVE

## 2019-07-30 LAB — ECHOCARDIOGRAM COMPLETE
Height: 71 in
Weight: 4875.2 oz

## 2019-07-30 LAB — FERRITIN: Ferritin: 147 ng/mL (ref 24–336)

## 2019-07-30 MED ORDER — BASAGLAR KWIKPEN 100 UNIT/ML ~~LOC~~ SOPN: 150.0000 [IU] | PEN_INJECTOR | SUBCUTANEOUS | Status: DC

## 2019-07-30 MED ORDER — FUROSEMIDE 10 MG/ML IJ SOLN
80.0000 mg | Freq: Two times a day (BID) | INTRAMUSCULAR | Status: DC
  Administered 2019-07-30 – 2019-07-31 (×3): 80 mg via INTRAVENOUS
  Filled 2019-07-30 (×3): qty 8

## 2019-07-30 MED ORDER — HYDRALAZINE HCL 20 MG/ML IJ SOLN
5.0000 mg | INTRAMUSCULAR | Status: DC | PRN
  Administered 2019-07-30: 5 mg via INTRAVENOUS
  Filled 2019-07-30: qty 1

## 2019-07-30 MED ORDER — INSULIN ASPART 100 UNIT/ML ~~LOC~~ SOLN: 0.0000 [IU] | Freq: Every day | SUBCUTANEOUS | Status: DC

## 2019-07-30 MED ORDER — POTASSIUM CHLORIDE CRYS ER 20 MEQ PO TBCR
40.0000 meq | EXTENDED_RELEASE_TABLET | Freq: Once | ORAL | Status: AC
  Administered 2019-07-30: 40 meq via ORAL
  Filled 2019-07-30: qty 2

## 2019-07-30 MED ORDER — ACETAMINOPHEN 500 MG PO TABS
1000.0000 mg | ORAL_TABLET | Freq: Once | ORAL | Status: AC
  Administered 2019-07-30: 1000 mg via ORAL
  Filled 2019-07-30: qty 2

## 2019-07-30 MED ORDER — SODIUM CHLORIDE 0.9% FLUSH
3.0000 mL | Freq: Two times a day (BID) | INTRAVENOUS | Status: DC
  Administered 2019-07-30: 3 mL via INTRAVENOUS

## 2019-07-30 MED ORDER — SODIUM CHLORIDE 0.9% FLUSH: 3.0000 mL | INTRAVENOUS | Status: DC | PRN

## 2019-07-30 MED ORDER — CLOPIDOGREL BISULFATE 75 MG PO TABS
75.0000 mg | ORAL_TABLET | Freq: Every day | ORAL | Status: DC
  Administered 2019-07-30 – 2019-07-31 (×2): 75 mg via ORAL
  Filled 2019-07-30 (×2): qty 1

## 2019-07-30 MED ORDER — CARVEDILOL 6.25 MG PO TABS
9.5000 mg | ORAL_TABLET | Freq: Two times a day (BID) | ORAL | Status: DC
  Administered 2019-07-30 – 2019-07-31 (×3): 9.375 mg via ORAL
  Filled 2019-07-30 (×3): qty 1

## 2019-07-30 MED ORDER — DIGOXIN 125 MCG PO TABS
0.1250 mg | ORAL_TABLET | Freq: Every day | ORAL | Status: DC
  Administered 2019-07-30 – 2019-07-31 (×2): 0.125 mg via ORAL
  Filled 2019-07-30 (×2): qty 1

## 2019-07-30 MED ORDER — PERFLUTREN LIPID MICROSPHERE
1.0000 mL | INTRAVENOUS | Status: AC | PRN
  Administered 2019-07-30: 2 mL via INTRAVENOUS
  Filled 2019-07-30: qty 10

## 2019-07-30 MED ORDER — ASPIRIN EC 81 MG PO TBEC
81.0000 mg | DELAYED_RELEASE_TABLET | Freq: Every day | ORAL | Status: DC
  Administered 2019-07-30 – 2019-07-31 (×2): 81 mg via ORAL
  Filled 2019-07-30 (×2): qty 1

## 2019-07-30 MED ORDER — INSULIN GLARGINE 100 UNIT/ML ~~LOC~~ SOLN
150.0000 [IU] | Freq: Every day | SUBCUTANEOUS | Status: DC
  Administered 2019-07-30 – 2019-07-31 (×2): 150 [IU] via SUBCUTANEOUS
  Filled 2019-07-30 (×2): qty 1.5

## 2019-07-30 MED ORDER — ACETAMINOPHEN 325 MG PO TABS
650.0000 mg | ORAL_TABLET | ORAL | Status: DC | PRN
  Administered 2019-07-30 – 2019-07-31 (×3): 650 mg via ORAL
  Filled 2019-07-30 (×3): qty 2

## 2019-07-30 MED ORDER — ATORVASTATIN CALCIUM 40 MG PO TABS
80.0000 mg | ORAL_TABLET | Freq: Every day | ORAL | Status: DC
  Administered 2019-07-30: 80 mg via ORAL
  Filled 2019-07-30 (×2): qty 2

## 2019-07-30 MED ORDER — LORATADINE 10 MG PO TABS
10.0000 mg | ORAL_TABLET | Freq: Every day | ORAL | Status: DC
  Administered 2019-07-30 – 2019-07-31 (×2): 10 mg via ORAL
  Filled 2019-07-30 (×2): qty 1

## 2019-07-30 MED ORDER — SODIUM CHLORIDE 0.9 % IV SOLN: 250.0000 mL | INTRAVENOUS | Status: DC | PRN

## 2019-07-30 MED ORDER — SACUBITRIL-VALSARTAN 97-103 MG PO TABS
1.0000 | ORAL_TABLET | Freq: Two times a day (BID) | ORAL | Status: DC
  Administered 2019-07-30 – 2019-07-31 (×3): 1 via ORAL
  Filled 2019-07-30 (×3): qty 1

## 2019-07-30 MED ORDER — ENOXAPARIN SODIUM 60 MG/0.6ML ~~LOC~~ SOLN
60.0000 mg | SUBCUTANEOUS | Status: DC
  Administered 2019-07-30 – 2019-07-31 (×2): 60 mg via SUBCUTANEOUS
  Filled 2019-07-30 (×2): qty 0.6

## 2019-07-30 MED ORDER — INSULIN ASPART 100 UNIT/ML ~~LOC~~ SOLN
0.0000 [IU] | Freq: Three times a day (TID) | SUBCUTANEOUS | Status: DC
  Administered 2019-07-30: 11 [IU] via SUBCUTANEOUS

## 2019-07-30 NOTE — ED Notes (Signed)
Transported to [place] via [facility] 

## 2019-07-30 NOTE — Progress Notes (Signed)
Patient refused Novolog insulin at lunch and supper - reports he only takes long acting insulin at home.  Dr. [place] notified of refusal and patient's glucose readings.  Will continue to monitor.

## 2019-07-30 NOTE — ED Notes (Signed)
Report given to David with [facility] 

## 2019-07-30 NOTE — Progress Notes (Signed)
PROGRESS NOTE    MIKIAH DEMOND  DPO:[id number] DOB: 1971/03/06 DOA: 07/29/2019 PCP: [name], MD    Brief Narrative:  INDIO SANTILLI is a 48 y.o. male with medical history significant of chronic systolic congestive heart failure, CAD, insulin-dependent type 2 diabetes, hypertension, hyperlipidemia, CVA, OSA presented to the ED for evaluation of shortness of breath.  Patient reports 1 week history of progressively worsening dyspnea, cough, orthopnea, abdominal distention, bilateral lower extremity edema, and 15 pound weight gain.  States last week he was drinking a lot of water and adding more salt to his food.  States his abdomen appears distended but he is not having any abdominal pain.  No nausea or vomiting.  Denies chest pain.  He is taking Lasix 60 mg daily at home with no improvement.  States he was previously using CPAP for sleep apnea but it was stopped by his doctor and he was told he no longer needed it.  ED Course: Afebrile.  Tachycardic and tachypneic.  Not hypoxic.  Blood pressure elevated with systolic up to 431V.  No leukocytosis.  BNP 285.  High-sensitivity troponin 21 >22.  EKG without acute ischemic changes.  SARS-CoV-2 antigen test negative, PCR test negative.  Chest x-ray showing cardiomegaly with vascular congestion and probable mild edema.  No focal consolidation. Patient received IV Lasix 80 mg.   Assessment & Plan:   Principal Problem:   Acute on chronic systolic congestive heart failure (HCC) Active Problems:   HLD (hyperlipidemia)   Essential hypertension   Stroke (HCC)   QT prolongation   Acute exacerbation of CHF (congestive heart failure) (HCC)   Acute on chronic combined systolic and diastolic congestive heart failure Patient presenting with 1 week history of progressive worsening shortness of breath, cough, orthopnea and abdominal distention with associated bilateral lower extremity edema.  Also reports a 15 pound weight gain over the past 1-2  weeks.  Follows with cardiology/heart failure team, Dr. [name] outpatient.  Known EF 20-25%.  On furosemide 60 mg p.o. daily, Coreg 9.375 twice daily, Entresto 97-103 mg twice daily at home, digoxin 0.125 mg p.o. daily.  Weight and vascular congestion no focal consolidation.  EKG with sinus tachycardia, left bundle branch block, HR 106, QTc 510. --TTE w/ EF 25-30%, trivial pericardial effusion, mild MR, dilated IVC --wt 138.2kg --net negative 1.0L since admission --continue furosemide 80 mg IV twice daily --2 g salt restricted diet, 1200 mL fluid restriction --Strict I's and O's and daily weights --Closely monitor renal function with aggressive IV diuresis --Discussed need for low-salt diet and avoid indiscretions such as soda  Hx CAD Patient denies chest pain and appears comfortable on exam. High-sensitivity troponin 21 >22.  EKG without acute ischemic changes.  Suspect mild troponin elevation is due to demand ischemia from acutely decompensated CHF. --Continue to monitor on telemetry --Continue home aspirin, Coreg  Hypertension Blood pressure elevated with systolic up to 400Q on presentation --Lasix as above --Continue home Coreg, Entresto as above --Hydralazine 53m IV q4h prn for SBP greater than 180  Hyperlipidemia --Continue Lipitor 867mPO daily  Normocytic anemia Hemoglobin 11.8 and MCV 91.7.  Hemoglobin was 14.8 in January 2020.  No signs of active bleeding.  Iron 32, TIBC 299, ferritin 147, consistent with anemia of chronic disease.  History of CVA --Continue Plavix  Insulin-dependent type 2 diabetes Blood glucose elevated at 339.  Labs not suggestive of DKA.  Bicarb 26, anion gap 11. --Hemoglobin A1c, 9.2 correlating with poor control --resume home Lantus  150 units daily --Sliding scale insulin moderate  --CBGs qAC/HS  QT prolongation on EKG QTC 510. --Keep potassium above 4 and magnesium above 2 --Avoid QT prolonging drugs if possible --Continue to monitor  EKG   DVT prophylaxis: Lovenox Code Status: Full code Family Communication: None present at bedside Disposition Plan: Remain inpatient, IV diuresis, further depending on clinical course, anticipate discharge home when medically ready   Consultants:   None  Procedures:   Transthoracic echocardiogram: IMPRESSIONS   1. Left ventricular ejection fraction, by visual estimation, is 25 to 30%. The left ventricle has severely decreased function. There is mildly increased left ventricular hypertrophy.  2. Definity contrast agent was given IV to delineate the left ventricular endocardial borders.  3. Indeterminate diastolic filling due to E-A fusion.  4. Mildly dilated left ventricular internal cavity size.  5. The left ventricle demonstrates global hypokinesis.  6. No LV thrombus on contrast imaging.  7. Global right ventricle has low normal systolic function.The right ventricular size is mildly enlarged. No increase in right ventricular wall thickness.  8. Left atrial size was normal.  9. Right atrial size was normal. 10. Trivial pericardial effusion is present. 11. The mitral valve is degenerative. Mild mitral valve regurgitation. 12. The tricuspid valve is grossly normal. 13. The aortic valve is tricuspid. Aortic valve regurgitation is not visualized. No evidence of aortic valve sclerosis or stenosis. 14. The pulmonic valve was grossly normal. Pulmonic valve regurgitation is not visualized. 15. Moderately elevated pulmonary artery systolic pressure. 16. The tricuspid regurgitant velocity is 3.52 m/s, and with an assumed right atrial pressure of 15 mmHg, the estimated right ventricular systolic pressure is moderately elevated at 64.6 mmHg. 17. The inferior vena cava is dilated in size with <50% respiratory variability, suggesting right atrial pressure of 15 mmHg. 18. A prior study was performed on 08/08/2018. 19. EF remains severely decreased 25-30%. 20. No significant change from prior  study.  Antimicrobials:   None   Subjective: Patient seen and examined at bedside, resting comfortably.  Continues with significant dyspnea especially with ambulation.  Patient reports noncompliance with his dietary habits to include salt use, sodas and other discretions.  He has noticed progressive change for the past 2 weeks with increased weight gain and dyspnea especially with lying flat.  Currently oxygenating well on room air.  Starting to feel better with IV diuresis, reports good urine output.  No other complaints or concerns at this time.  Denies headache, no fever/chills/night sweats, no nausea/vomiting/diarrhea, no chest pain, palpitations, no abdominal pain, no weakness, no fatigue, no paresthesias.  No acute events overnight per nursing staff.  Objective: Vitals:   07/30/19 0601 07/30/19 0815 07/30/19 1034 07/30/19 1232  BP:  126/62  (!) 159/95  Pulse:  100 96 90  Resp:    18  Temp:    99 F (37.2 C)  TempSrc:    Oral  SpO2:  92%  98%  Weight: (!) 138.2 kg     Height: _0  (1.803 m)       Intake/Output Summary (Last 24 hours) at 07/30/2019 1440 Last data filed at 07/30/2019 1300 Gross per 24 hour  Intake 490 ml  Output 2630 ml  Net -2140 ml   Filed Weights   07/29/19 1552 07/30/19 0601  Weight: (!) 137 kg (!) 138.2 kg    Examination:  General exam: Appears calm and comfortable, obese Respiratory system: Decreased breath sounds bilateral bases with crackles, no wheezes, normal respiratory effort, no accessory muscle use, oxygenating  well on room air. Cardiovascular system: S1 & S2 heard, RRR. No JVD, murmurs, rubs, gallops or clicks.  2+ pitting pedal edema up to mid shin bilaterally Gastrointestinal system: Abdomen is nondistended, protuberant, soft and nontender. No organomegaly or masses felt. Normal bowel sounds heard. Central nervous system: Alert and oriented. No focal neurological deficits. Extremities: Symmetric 5 x 5 power. Skin: No rashes, lesions  or ulcers Psychiatry: Judgement and insight appear normal. Mood & affect appropriate.     Data Reviewed: I have personally reviewed following labs and imaging studies  CBC: Recent Labs  Lab 07/29/19 1708  WBC 10.3  NEUTROABS 8.3*  HGB 11.8*  HCT 36.5*  MCV 91.7  PLT 465   Basic Metabolic Panel: Recent Labs  Lab 07/29/19 1708 07/30/19 0856  NA 138  --   K 3.5  --   CL 101  --   CO2 26  --   GLUCOSE 339*  --   BUN 18  --   CREATININE 1.03  --   CALCIUM 8.4*  --   MG  --  1.8   GFR: Estimated Creatinine Clearance: 122 mL/min (by C-G formula based on SCr of 1.03 mg/dL). Liver Function Tests: Recent Labs  Lab 07/29/19 1708  AST 22  ALT 31  ALKPHOS 79  BILITOT 0.6  PROT 7.1  ALBUMIN 3.3*   No results for input(s): LIPASE, AMYLASE in the last 168 hours. No results for input(s): AMMONIA in the last 168 hours. Coagulation Profile: No results for input(s): INR, PROTIME in the last 168 hours. Cardiac Enzymes: No results for input(s): CKTOTAL, CKMB, CKMBINDEX, TROPONINI in the last 168 hours. BNP (last 3 results) No results for input(s): PROBNP in the last 8760 hours. HbA1C: Recent Labs    07/30/19 0856  HGBA1C 9.2*   CBG: Recent Labs  Lab 07/30/19 0542 07/30/19 0740 07/30/19 1227  GLUCAP 319* 350* 275*   Lipid Profile: No results for input(s): CHOL, HDL, LDLCALC, TRIG, CHOLHDL, LDLDIRECT in the last 72 hours. Thyroid Function Tests: No results for input(s): TSH, T4TOTAL, FREET4, T3FREE, THYROIDAB in the last 72 hours. Anemia Panel: Recent Labs    07/30/19 0856  VITAMINB12 316  FOLATE 10.2  FERRITIN 147  TIBC 299  IRON 32*  RETICCTPCT 2.2   Sepsis Labs: No results for input(s): PROCALCITON, LATICACIDVEN in the last 168 hours.  Recent Results (from the past 240 hour(s))  SARS Coronavirus 2 Ag (30 min TAT) - Nasal Swab (BD Veritor Kit)     Status: None   Collection Time: 07/29/19  5:09 PM   Specimen: Nasal Swab (BD Veritor Kit)  Result Value  Ref Range Status   SARS Coronavirus 2 Ag NEGATIVE NEGATIVE Final    Comment: (NOTE) SARS-CoV-2 antigen NOT DETECTED.  Negative results are presumptive.  Negative results do not preclude SARS-CoV-2 infection and should not be used as the sole basis for treatment or other patient management decisions, including infection  control decisions, particularly in the presence of clinical signs and  symptoms consistent with COVID-19, or in those who have been in contact with the virus.  Negative results must be combined with clinical observations, patient history, and epidemiological information. The expected result is Negative. Fact Sheet for Patients: PodPark.tn Fact Sheet for Healthcare Providers: GiftContent.is This test is not yet approved or cleared by the [place] FDA and  has been authorized for detection and/or diagnosis of SARS-CoV-2 by FDA under an Emergency Use Authorization (EUA).  This EUA will remain in effect (meaning  this test can be used) for the duration of  the COVID-19 de claration under Section 564(b)(1) of the Act, 21 U.S.C. section 360bbb-3(b)(1), unless the authorization is terminated or revoked sooner. Performed at [facility], [address] [place], [place] [address]   SARS CORONAVIRUS 2 (TAT 6-24 HRS) Nasopharyngeal Nasopharyngeal Swab     Status: None   Collection Time: 07/29/19  7:01 PM   Specimen: Nasopharyngeal Swab  Result Value Ref Range Status   SARS Coronavirus 2 NEGATIVE NEGATIVE Final    Comment: (NOTE) SARS-CoV-2 target nucleic acids are NOT DETECTED. The SARS-CoV-2 RNA is generally detectable in upper and lower respiratory specimens during the acute phase of infection. Negative results do not preclude SARS-CoV-2 infection, do not rule out co-infections with other pathogens, and should not be used as the sole basis for treatment or other patient management decisions. Negative  results must be combined with clinical observations, patient history, and epidemiological information. The expected result is Negative. Fact Sheet for Patients: SugarRoll.be Fact Sheet for Healthcare Providers: https://www.woods-mathews.com/ This test is not yet approved or cleared by the [place] FDA and  has been authorized for detection and/or diagnosis of SARS-CoV-2 by FDA under an Emergency Use Authorization (EUA). This EUA will remain  in effect (meaning this test can be used) for the duration of the COVID-19 declaration under Section 56 4(b)(1) of the Act, 21 U.S.C. section 360bbb-3(b)(1), unless the authorization is terminated or revoked sooner. Performed at [facility], [place] [address], [place], [place] [address]          Radiology Studies: DG Chest Portable 1 View  Result Date: 07/29/2019 CLINICAL DATA:  48 year old male with shortness of breath. EXAM: PORTABLE CHEST 1 VIEW COMPARISON:  Chest radiograph dated 05/08/2017. FINDINGS: There is cardiomegaly with vascular congestion and probable mild edema. No focal consolidation, pleural effusion, or pneumothorax. No acute osseous pathology. IMPRESSION: Cardiomegaly with findings of mild congestive heart failure. No focal consolidation. Electronically Signed   By: Anner Crete M.D.   On: 07/29/2019 17:03   ECHOCARDIOGRAM COMPLETE  Result Date: 07/30/2019   ECHOCARDIOGRAM REPORT   Patient Name:   COSBY PROBY Date of Exam: 07/30/2019 Medical Rec #:  [id number]       Height:       70.9 in Accession #:    [id number]      Weight:       304.7 lb Date of Birth:  09-09-1970        BSA:          2.52 m Patient Age:    31 years        BP:           179/85 mmHg Patient Gender: M               HR:           102 bpm. Exam Location:  Inpatient Procedure: 2D Echo, Cardiac Doppler, Color Doppler and Intracardiac            Opacification Agent Indications:    I50.40* Unspecified combined  systolic (congestive) and diastolic                 (congestive) heart failure  History:        Patient has prior history of Echocardiogram examinations, most                 recent 08/08/2018. CHF, Previous Myocardial Infarction, Abnormal  ECG, Stroke; Risk Factors:Hypertension and Diabetes.  Sonographer:    Roseanna Rainbow RDCS Referring Phys: [id number] [facility]  Sonographer Comments: Suboptimal apical window, suboptimal subcostal window and patient is morbidly obese. Image acquisition challenging due to patient body habitus. IMPRESSIONS  1. Left ventricular ejection fraction, by visual estimation, is 25 to 30%. The left ventricle has severely decreased function. There is mildly increased left ventricular hypertrophy.  2. Definity contrast agent was given IV to delineate the left ventricular endocardial borders.  3. Indeterminate diastolic filling due to E-A fusion.  4. Mildly dilated left ventricular internal cavity size.  5. The left ventricle demonstrates global hypokinesis.  6. No LV thrombus on contrast imaging.  7. Global right ventricle has low normal systolic function.The right ventricular size is mildly enlarged. No increase in right ventricular wall thickness.  8. Left atrial size was normal.  9. Right atrial size was normal. 10. Trivial pericardial effusion is present. 11. The mitral valve is degenerative. Mild mitral valve regurgitation. 12. The tricuspid valve is grossly normal. 13. The aortic valve is tricuspid. Aortic valve regurgitation is not visualized. No evidence of aortic valve sclerosis or stenosis. 14. The pulmonic valve was grossly normal. Pulmonic valve regurgitation is not visualized. 15. Moderately elevated pulmonary artery systolic pressure. 16. The tricuspid regurgitant velocity is 3.52 m/s, and with an assumed right atrial pressure of 15 mmHg, the estimated right ventricular systolic pressure is moderately elevated at 64.6 mmHg. 17. The inferior vena cava is dilated in  size with <50% respiratory variability, suggesting right atrial pressure of 15 mmHg. 18. A prior study was performed on 08/08/2018. 19. EF remains severely decreased 25-30%. 20. No significant change from prior study. FINDINGS  Left Ventricle: Left ventricular ejection fraction, by visual estimation, is 25 to 30%. The left ventricle has severely decreased function. Definity contrast agent was given IV to delineate the left ventricular endocardial borders. The left ventricle demonstrates global hypokinesis. The left ventricular internal cavity size was mildly dilated left ventricle. There is mildly increased left ventricular hypertrophy. Indeterminate diastolic filling due to E-A fusion. No LV thrombus on contrast imaging. Right Ventricle: The right ventricular size is mildly enlarged. No increase in right ventricular wall thickness. Global RV systolic function is has low normal systolic function. The tricuspid regurgitant velocity is 3.52 m/s, and with an assumed right atrial pressure of 15 mmHg, the estimated right ventricular systolic pressure is moderately elevated at 64.6 mmHg. Left Atrium: Left atrial size was normal in size. Right Atrium: Right atrial size was normal in size Pericardium: Trivial pericardial effusion is present. Mitral Valve: The mitral valve is degenerative in appearance. There is mild calcification of the mitral valve leaflet(s). Mild mitral valve regurgitation. Tricuspid Valve: The tricuspid valve is grossly normal. Tricuspid valve regurgitation is mild. Aortic Valve: The aortic valve is tricuspid. Aortic valve regurgitation is not visualized. The aortic valve is structurally normal, with no evidence of sclerosis or stenosis. Aortic valve mean gradient measures 2.9 mmHg. Aortic valve peak gradient measures 4.5 mmHg. Aortic valve area, by VTI measures 2.58 cm. Pulmonic Valve: The pulmonic valve was grossly normal. Pulmonic valve regurgitation is not visualized. Pulmonic regurgitation is not  visualized. Aorta: The aortic root and ascending aorta are structurally normal, with no evidence of dilitation. Venous: The inferior vena cava is dilated in size with less than 50% respiratory variability, suggesting right atrial pressure of 15 mmHg. IAS/Shunts: No atrial level shunt detected by color flow Doppler. Additional Comments: A prior study was performed on 08/08/2018.  LEFT VENTRICLE PLAX 2D LVIDd:         5.90 cm LVIDs:         5.00 cm LV PW:         1.70 cm LV IVS:        1.20 cm LVOT diam:     2.00 cm LV SV:         55 ml LV SV Index:   20.42 LVOT Area:     3.14 cm  LEFT ATRIUM           Index LA diam:      4.90 cm 1.95 cm/m LA Vol (A2C): 40.4 ml 16.04 ml/m LA Vol (A4C): 18.8 ml 7.46 ml/m  AORTIC VALVE AV Area (Vmax):    2.60 cm AV Area (Vmean):   2.58 cm AV Area (VTI):     2.58 cm AV Vmax:           106.65 cm/s AV Vmean:          83.240 cm/s AV VTI:            0.193 m AV Peak Grad:      4.5 mmHg AV Mean Grad:      2.9 mmHg LVOT Vmax:         88.26 cm/s LVOT Vmean:        68.337 cm/s LVOT VTI:          0.159 m LVOT/AV VTI ratio: 0.82  AORTA Ao Root diam: 3.10 cm Ao Asc diam:  3.30 cm TRICUSPID VALVE TR Peak grad:   49.6 mmHg TR Vmax:        356.99 cm/s  SHUNTS Systemic VTI:  0.16 m Systemic Diam: 2.00 cm  Eleonore Chiquito MD Electronically signed by Eleonore Chiquito MD Signature Date/Time: 07/30/2019/12:20:38 PM    Final         Scheduled Meds: . aspirin EC  81 mg Oral Daily  . atorvastatin  80 mg Oral QHS  . carvedilol  9.375 mg Oral BID WC  . clopidogrel  75 mg Oral Daily  . digoxin  0.125 mg Oral Daily  . enoxaparin (LOVENOX) injection  60 mg Subcutaneous Q24H  . furosemide  80 mg Intravenous BID  . insulin aspart  0-15 Units Subcutaneous TID WC  . insulin aspart  0-5 Units Subcutaneous QHS  . insulin glargine  150 Units Subcutaneous Daily  . loratadine  10 mg Oral Daily  . sacubitril-valsartan  1 tablet Oral BID  . sodium chloride flush  3 mL Intravenous Q12H   Continuous  Infusions: . sodium chloride       LOS: 0 days    Time spent: 37 minutes spent on chart review, discussion with nursing staff, consultants, updating family and interview/physical exam; more than 50% of that time was spent in counseling and/or coordination of care.    Maliek Schellhorn J [place] DO [facility] 07/30/2019, 2:40 PM

## 2019-07-30 NOTE — ED Notes (Signed)
Called [facility] [place] at [facility] and notified that [facility] was here getting ready to transport pt to their facility

## 2019-07-30 NOTE — H&P (Signed)
History and Physical    Mitchell [name] GQB:[id number] DOB: 12-Jun-1971 DOA: 07/29/2019  PCP: [name], MD Patient coming from: [facility]  Chief Complaint: Shortness of breath  HPI: Mitchell [name] is a 48 y.o. male with medical history significant of chronic systolic congestive heart failure, CAD, insulin-dependent type 2 diabetes, hypertension, hyperlipidemia, CVA, OSA presented to the ED for evaluation of shortness of breath.  Patient reports 1 week history of progressively worsening dyspnea, cough, orthopnea, abdominal distention, bilateral lower extremity edema, and 15 pound weight gain.  States last week he was drinking a lot of water and adding more salt to his food.  States his abdomen appears distended but he is not having any abdominal pain.  No nausea or vomiting.  Denies chest pain.  He is taking Lasix 60 mg daily at home with no improvement.  States he was previously using CPAP for sleep apnea but it was stopped by his doctor and he was told he no longer needed it.  ED Course: Afebrile.  Tachycardic and tachypneic.  Not hypoxic.  Blood pressure elevated with systolic up to 828M.  No leukocytosis.  BNP 285.  High-sensitivity troponin 21 >22.  EKG without acute ischemic changes.  SARS-CoV-2 antigen test negative, PCR test negative.  Chest x-ray showing cardiomegaly with vascular congestion and probable mild edema.  No focal consolidation. Patient received IV Lasix 80 mg.  Review of Systems:  All systems reviewed and apart from history of presenting illness, are negative.  Past Medical History:  Diagnosis Date  . Bipolar disorder ([place])   . CHF (congestive heart failure) ([place])   . Chronic systolic heart failure ([place]) 11/2010   a. NICM 4/14 EF 20-25%, TR mild  . CVA (cerebral infarction)    No residual deficits  . Depression    PTSD,   . Diabetes mellitus    TYPE II; UNCONTROLLED BY HEMOGLOBIN A1c; STABLE AS  PER DISCHARGE  . Headache(784.0)   . Herpes simplex of male genitalia    . History of colonic polyps   . Hyperlipidemia   . Hypertension   . Myocardial infarction ([place]) 1987   (while playing football)  . Non-ischemic cardiomyopathy (HCC)    No ischemia on myoview, showed EF of 42%. 2D echo showed EF 03-49% with diatolic dysfunction in 1791  . Obesity   . OSA (obstructive sleep apnea)    Being set up again for c-pap. Pt sts he does not use a cpap, and the test was negative.  . Pneumonia   . Post-cardiac injury syndrome ([place])    History of cardiac injury from blunt trauma  . PVCs (premature ventricular contractions)   . Schizophrenia ([place])    Goes to [facility]  . SOB (shortness of breath)   . Stroke [facility]) 2005   some left side weakness  . Syncope    Recurrent, thought to be vasovagal. Also has h/o frequent PVCs.     Past Surgical History:  Procedure Laterality Date  . CARDIAC CATHETERIZATION  12/19/10   DIFFUSE NONOBSTRUCTIVE CAD; NONISCHEMIC CARDIOMYOPATHY; LEFT VENTRICULAR ANGIOGRAM WAS PERFORMED SECONDARY TO  ELEVATED LEFT VENTRICULAR FILLING PRESSURES  . COLONOSCOPY W/ POLYPECTOMY    . METATARSAL HEAD EXCISION Right 08/29/2018   Procedure: METATARSAL HEAD RESECTION;  Surgeon: Edrick Kins, DPM;  Location: [place];  Service: Podiatry;  Laterality: Right;  . METATARSAL OSTEOTOMY Right 08/29/2018   Procedure: SUB FIFTHE METATARSIA RIGHT FOOT;  Surgeon: Edrick Kins, DPM;  Location: [place];  Service: Podiatry;  Laterality: Right;  . MULTIPLE EXTRACTIONS WITH ALVEOLOPLASTY  01/27/2014   "all my teeth; 4 Quadrants of alveoloplasty  . MULTIPLE EXTRACTIONS WITH ALVEOLOPLASTY N/A 01/27/2014   Procedure: EXTRACTION OF TEETH #'1, 2, 3, 4, 5, 6, 7, 8, 9, 10, 11, 12, 13, 14, 15, 16, 17, 20, 21, 22, 23, 24, 25, 26, 27, 28, 29, 31 and 32 WITH ALVEOLOPLASTY;  Surgeon: Lenn Cal, DDS;  Location: [place];  Service: Oral Surgery;  Laterality: N/A;  . ORIF FINGER / THUMB FRACTURE Right   . RIGHT/LEFT HEART CATH AND CORONARY ANGIOGRAPHY N/A  09/26/2017   Procedure: RIGHT/LEFT HEART CATH AND CORONARY ANGIOGRAPHY;  Surgeon: Jolaine Artist, MD;  Location: [place] CV [facility];  Service: Cardiovascular;  Laterality: N/A;  . WOUND DEBRIDEMENT Right 08/29/2018   Procedure: Debridement of ulcer on right fifth metatarsal;  Surgeon: Edrick Kins, DPM;  Location: [place];  Service: Podiatry;  Laterality: Right;     reports that he has never smoked. He has never used smokeless tobacco. He reports that he does not drink alcohol or use drugs.  Allergies  Allergen Reactions  . Nsaids Anaphylaxis and Other (See Comments)    Able to tolerate aspirin     Family History  Problem Relation Age of Onset  . Heart disease Mother        MI  . Heart failure Mother   . Diabetes Mother        ALSO IN MOST OF HIS SIBLINGS; 2 UNLCES HAVE ALSO PASSED AWAY FROM DM  . Cardiomyopathy Mother   . Cancer - Ovarian Mother   . Heart disease Father   . Hypertension Father   . Diabetes Father   . Diabetes Sister   . Diabetes Brother     Prior to Admission medications   Medication Sig Start Date End Date Taking? Authorizing Provider  atorvastatin (LIPITOR) 40 MG tablet TAKE 2 TABLETS BY MOUTH AT BEDTIME Patient taking differently: Take 80 mg by mouth at bedtime.  05/29/18  Yes [name], [name], MD  carvedilol (COREG) 6.25 MG tablet TAKE 1 & 1/2 (ONE & ONE-HALF) TABLETS BY MOUTH TWICE DAILY WITH A MEAL Patient taking differently: Take 9.5 mg by mouth 2 (two) times daily with a meal.  05/19/19  Yes Bensimhon, Shaune Pascal, MD  cetirizine (ZYRTEC) 10 MG tablet Take 1 tablet by mouth once daily Patient taking differently: Take 10 mg by mouth daily.  09/30/18  Yes [name], [name], MD  digoxin (LANOXIN) 0.125 MG tablet Take 1 tablet (0.125 mg total) by mouth daily. 11/04/18  Yes [name], [name], MD  ENTRESTO 97-103 MG Take 1 tablet by mouth twice daily Patient taking differently: Take 1 tablet by mouth 2 (two) times daily.  07/21/19  Yes Bensimhon, Shaune Pascal,  MD  furosemide (LASIX) 40 MG tablet TAKE 1 & 1/2 (ONE & ONE-HALF) TABLETS BY MOUTH DAILY Patient taking differently: Take 60 mg by mouth daily.  07/21/19  Yes [name], [name], MD  ibuprofen (ADVIL) 200 MG tablet Take 400 mg by mouth every 6 (six) hours as needed for moderate pain.   Yes [provider]  Insulin Glargine (BASAGLAR KWIKPEN) 100 UNIT/ML SOPN Inject 1.5 mLs (150 Units total) into the skin every morning. And pen needles 1/day 02/04/19  Yes Renato Shin, MD  Phenyleph-Doxylamine-DM-APAP (NYQUIL SEVERE COLD/FLU) 5-6.25-10-325 MG/15ML LIQD Take 30 mLs by mouth at bedtime as needed (cough).   Yes [provider]  Potassium Chloride ER 20  MEQ TBCR Take 1 tablet by mouth once daily Patient taking differently: Take 20 mEq by mouth daily.  03/10/19  Yes Bensimhon, Shaune Pascal, MD  aspirin 81 MG EC tablet Take 1 tablet (81 mg total) by mouth daily. Patient not taking: Reported on 07/30/2019 05/23/17   [name], [name], MD  Blood Glucose Monitoring Suppl (ACCU-CHEK GUIDE ME) w/Device KIT 1 each by Does not apply route 2 (two) times daily. Use to monitor glucose levels BID 06/07/18   Renato Shin, MD  clopidogrel (PLAVIX) 75 MG tablet TAKE 1 TABLET BY MOUTH ONCE DAILY Patient not taking: No sig reported 05/29/18   [name], [name], MD  fluticasone (FLONASE) 50 MCG/ACT nasal spray USE 2 SPRAY(S) IN EACH NOSTRIL DAILY Patient not taking: Reported on 07/30/2019 10/01/18   [name], [name], MD  glucose blood (ACCU-CHEK GUIDE) test strip Use to monitor glucose levels BID 06/07/18   Renato Shin, MD  HYDROcodone-acetaminophen (NORCO/VICODIN) 5-325 MG tablet Take 1-2 tablets by mouth every 4 (four) hours as needed. Patient not taking: Reported on 07/30/2019 02/28/19   Malvin Johns, MD  Lancets 28G MISC Use for glucose testing up to BID 11/02/16   [name], [name], MD  Lancets Misc. (ACCU-CHEK FASTCLIX LANCET) KIT Use to monitor glucose levels BID 06/07/18   Renato Shin, MD     Physical Exam: Vitals:   07/30/19 0130 07/30/19 0300 07/30/19 0447 07/30/19 0601  BP: (!) 162/102 (!) 164/97 (!) 179/85   Pulse: (!) 107 (!) 105 (!) 105   Resp:   20   Temp:   99 F (37.2 C)   TempSrc:   Oral   SpO2: 90% 96% 99%   Weight:    (!) 138.2 kg  Height:    5' 11" (1.803 m)    Physical Exam  Constitutional: He is oriented to person, place, and time. He appears well-developed and well-nourished. No distress.  HENT:  Head: Normocephalic.  Eyes: Right eye exhibits no discharge. Left eye exhibits no discharge.  Cardiovascular: Normal rate, regular rhythm and intact distal pulses.  Pulmonary/Chest: Effort normal. He has no wheezes. He has rales.  Speaking clearly in full sentences No increased work of breathing Bibasilar rales  Abdominal: Soft. Bowel sounds are normal. He exhibits distension. There is no abdominal tenderness. There is no rebound and no guarding.  Significant abdominal distention  Musculoskeletal:        General: Edema present.     Cervical back: Neck supple.     Comments: +2 to +3 pitting edema of bilateral lower extremities  Neurological: He is alert and oriented to person, place, and time.  Skin: Skin is warm and dry. He is not diaphoretic.     Labs on Admission: I have personally reviewed following labs and imaging studies  CBC: Recent Labs  Lab 07/29/19 1708  WBC 10.3  NEUTROABS 8.3*  HGB 11.8*  HCT 36.5*  MCV 91.7  PLT 599   Basic Metabolic Panel: Recent Labs  Lab 07/29/19 1708  NA 138  K 3.5  CL 101  CO2 26  GLUCOSE 339*  BUN 18  CREATININE 1.03  CALCIUM 8.4*   GFR: Estimated Creatinine Clearance: 122 mL/min (by C-G formula based on SCr of 1.03 mg/dL). Liver Function Tests: Recent Labs  Lab 07/29/19 1708  AST 22  ALT 31  ALKPHOS 79  BILITOT 0.6  PROT 7.1  ALBUMIN 3.3*   No results for input(s): LIPASE, AMYLASE in the last 168 hours. No results for input(s): AMMONIA in  the last 168 hours. Coagulation  Profile: No results for input(s): INR, PROTIME in the last 168 hours. Cardiac Enzymes: No results for input(s): CKTOTAL, CKMB, CKMBINDEX, TROPONINI in the last 168 hours. BNP (last 3 results) No results for input(s): PROBNP in the last 8760 hours. HbA1C: No results for input(s): HGBA1C in the last 72 hours. CBG: Recent Labs  Lab 07/30/19 0542  GLUCAP 319*   Lipid Profile: No results for input(s): CHOL, HDL, LDLCALC, TRIG, CHOLHDL, LDLDIRECT in the last 72 hours. Thyroid Function Tests: No results for input(s): TSH, T4TOTAL, FREET4, T3FREE, THYROIDAB in the last 72 hours. Anemia Panel: No results for input(s): VITAMINB12, FOLATE, FERRITIN, TIBC, IRON, RETICCTPCT in the last 72 hours. Urine analysis:    Component Value Date/Time   COLORURINE YELLOW 05/10/2018 1955   APPEARANCEUR HAZY (A) 05/10/2018 1955   LABSPEC >1.030 (H) 05/10/2018 1955   PHURINE 5.5 05/10/2018 1955   GLUCOSEU 100 (A) 05/10/2018 1955   HGBUR SMALL (A) 05/10/2018 1955   BILIRUBINUR NEGATIVE 05/10/2018 1955   KETONESUR NEGATIVE 05/10/2018 1955   PROTEINUR 30 (A) 05/10/2018 1955   UROBILINOGEN 0.2 12/08/2014 2355   NITRITE NEGATIVE 05/10/2018 1955   LEUKOCYTESUR NEGATIVE 05/10/2018 1955    Radiological Exams on Admission: DG Chest Portable 1 View  Result Date: 07/29/2019 CLINICAL DATA:  48 year old male with shortness of breath. EXAM: PORTABLE CHEST 1 VIEW COMPARISON:  Chest radiograph dated 05/08/2017. FINDINGS: There is cardiomegaly with vascular congestion and probable mild edema. No focal consolidation, pleural effusion, or pneumothorax. No acute osseous pathology. IMPRESSION: Cardiomegaly with findings of mild congestive heart failure. No focal consolidation. Electronically Signed   By: Anner Crete M.D.   On: 07/29/2019 17:03    EKG: Independently reviewed.  Sinus tachycardia, heart rate 106.  LBBB similar to prior tracing.  QTc 510.  Assessment/Plan Principal Problem:   Acute on chronic  systolic congestive heart failure (HCC) Active Problems:   HLD (hyperlipidemia)   Essential hypertension   Stroke (HCC)   QT prolongation   Acute exacerbation of chronic combined systolic and diastolic congestive heart failure Patient is presenting with a 1 week history of progressively worsening dyspnea, cough, orthopnea, abdominal distention, bilateral lower extremity edema, and 15 pound weight gain.  Appears volume overloaded on exam with bibasilar rales, significant abdominal distention, and bilateral lower extremity edema.  Currently satting well on room air.  BNP 285, likely falsely low given morbid obesity (BMI 41.53). Chest x-ray showing cardiomegaly with vascular congestion and probable mild edema. Echo done January 2020 with LVEF 20 to 25% and grade 2 diastolic dysfunction. -Cardiac monitoring -Received IV Lasix 80 mg in the ED.  Continue IV Lasix 80 mg twice daily. -Continue home Coreg, digoxin, Entresto -Monitor intake and output, daily weights, low-sodium diet with fluid restriction -Repeat echocardiogram -Monitor renal function with diuretic -Continuous pulse ox -Supplemental oxygen as needed to keep oxygen saturation above 92%  CAD Patient denies chest pain and appears comfortable on exam. High-sensitivity troponin 21 >22.  EKG without acute ischemic changes.  Suspect mild troponin elevation is due to demand ischemia from acutely decompensated CHF. -Cardiac monitoring -Continue home aspirin, Coreg  Hypertension Blood pressure elevated with systolic up to 829F. -Lasix as above -Continue home Coreg, Entresto as above -Hydralazine as needed for SBP greater than 180  Hyperlipidemia -Continue Lipitor  Normocytic anemia Hemoglobin 11.8 and MCV 91.7.  Hemoglobin was 14.8 in January 2020.  No signs of active bleeding. -FOBT -Anemia panel  History of CVA -Continue Plavix  Insulin-dependent  type 2 diabetes Blood glucose elevated at 339.  Labs not suggestive of DKA.   Bicarb 26, anion gap 11. -Check A1c.  Sliding scale insulin moderate ACHS and CBG checks. -Continue Lantus 150 units daily  QT prolongation on EKG QTC 510. -Cardiac monitoring -Keep potassium above 4 and magnesium above 2 -Avoid QT prolonging drugs if possible -Continue to monitor EKG  DVT prophylaxis: Lovenox Code Status: Patient wishes to be full code. Family Communication: No family available at this time. Disposition Plan: Anticipate discharge after clinical improvement.   Consults called: None Admission status: It is my clinical opinion that admission to INPATIENT is reasonable and necessary in this 48 y.o. male . presenting with symptoms concerning for acutely decompensated chronic combined systolic and diastolic congestive heart failure.  Appears significantly volume overloaded.  He will need IV diuresis for several days.  Given the aforementioned, the predictability of an adverse outcome is felt to be significant. I expect that the patient will require at least 2 midnights in the hospital to treat this condition.   The medical decision making on this patient was of high complexity and the patient is at high risk for clinical deterioration, therefore this is a level 3 visit.  Shela Leff MD [facility] Pager [phone]  If 7PM-7AM, please contact night-coverage [email or web address] Password [email or web address]  07/30/2019, 6:12 AM

## 2019-07-30 NOTE — Progress Notes (Signed)
  Echocardiogram 2D Echocardiogram has been performed.  Roseanna Rainbow R 07/30/2019, 10:20 AM

## 2019-07-30 NOTE — Progress Notes (Signed)
Lovenox per Pharmacy for DVT Prophylaxis    Pharmacy has been consulted from dosing enoxaparin (lovenox) in this patient for DVT prophylaxis.  The pharmacist has reviewed pertinent labs (Hgb _11.8__; PLT_177__), patient weight (_137__kg) and renal function (CrCl_>90__mL/min) and decided that enoxaparin _60_mg SQ Q24Hrs is appropriate for this patient.  The pharmacy department will sign off at this time.  Please reconsult pharmacy if status changes or for further issues.  Thank you  Cyndia Diver PharmD, BCPS  07/30/2019, 6:02 AM

## 2019-07-31 LAB — GLUCOSE, CAPILLARY
Glucose-Capillary: 113 mg/dL — ABNORMAL HIGH (ref 70–99)
Glucose-Capillary: 113 mg/dL — ABNORMAL HIGH (ref 70–99)

## 2019-07-31 LAB — BASIC METABOLIC PANEL
Anion gap: 11 (ref 5–15)
BUN: 17 mg/dL (ref 6–20)
CO2: 27 mmol/L (ref 22–32)
Calcium: 8.6 mg/dL — ABNORMAL LOW (ref 8.9–10.3)
Chloride: 105 mmol/L (ref 98–111)
Creatinine, Ser: 1.01 mg/dL (ref 0.61–1.24)
GFR calc Af Amer: >60 mL/min (ref >60)
GFR calc non Af Amer: >60 mL/min (ref >60)
Glucose, Bld: 127 mg/dL — ABNORMAL HIGH (ref 70–99)
Potassium: 3 mmol/L — ABNORMAL LOW (ref 3.5–5.1)
Sodium: 143 mmol/L (ref 135–145)

## 2019-07-31 MED ORDER — POTASSIUM CHLORIDE CRYS ER 20 MEQ PO TBCR
40.0000 meq | EXTENDED_RELEASE_TABLET | ORAL | Status: AC
  Administered 2019-07-31 (×2): 40 meq via ORAL
  Filled 2019-07-31 (×2): qty 2

## 2019-07-31 MED ORDER — FUROSEMIDE 40 MG PO TABS: ORAL_TABLET | ORAL | 0 refills | Status: DC
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
## 2019-07-31 NOTE — Plan of Care (Signed)
  Problem: Education: Goal: Knowledge of General Education information will improve Description: Including pain rating scale, medication(s)/side effects and non-pharmacologic comfort measures 07/31/2019 1148 by Hubert Azure, RN Outcome: Adequate for Discharge 07/31/2019 1148 by Hubert Azure, RN Outcome: Progressing 07/31/2019 1148 by Hubert Azure, RN Outcome: Progressing   Problem: Activity: Goal: Risk for activity intolerance will decrease 07/31/2019 1148 by Hubert Azure, RN Outcome: Adequate for Discharge 07/31/2019 1148 by Hubert Azure, RN Outcome: Progressing 07/31/2019 1148 by Hubert Azure, RN Outcome: Progressing   Problem: Nutrition: Goal: Adequate nutrition will be maintained 07/31/2019 1148 by Hubert Azure, RN Outcome: Adequate for Discharge 07/31/2019 1148 by Hubert Azure, RN Outcome: Progressing 07/31/2019 1148 by Hubert Azure, RN Outcome: Progressing   Problem: Elimination: Goal: Will not experience complications related to bowel motility 07/31/2019 1148 by Hubert Azure, RN Outcome: Adequate for Discharge 07/31/2019 1148 by Hubert Azure, RN Outcome: Progressing 07/31/2019 1148 by Hubert Azure, RN Outcome: Progressing Goal: Will not experience complications related to urinary retention Outcome: Adequate for Discharge   Problem: Pain Managment: Goal: General experience of comfort will improve Outcome: Adequate for Discharge   Problem: Safety: Goal: Ability to remain free from injury will improve Outcome: Adequate for Discharge   Problem: Skin Integrity: Goal: Risk for impaired skin integrity will decrease Outcome: Adequate for Discharge   Problem: Education: Goal: Ability to demonstrate management of disease process will improve Outcome: Adequate for Discharge Goal: Ability to verbalize understanding of medication therapies will improve Outcome: Adequate for Discharge Goal: Individualized Educational  Video(s) Outcome: Adequate for Discharge   Problem: Activity: Goal: Capacity to carry out activities will improve Outcome: Adequate for Discharge   Problem: Cardiac: Goal: Ability to achieve and maintain adequate cardiopulmonary perfusion will improve Outcome: Adequate for Discharge

## 2019-07-31 NOTE — Plan of Care (Signed)
  Problem: Education: Goal: Knowledge of General Education information will improve Description: Including pain rating scale, medication(s)/side effects and non-pharmacologic comfort measures 07/31/2019 1148 by Hubert Azure, RN Outcome: Progressing 07/31/2019 1148 by Hubert Azure, RN Outcome: Progressing   Problem: Activity: Goal: Risk for activity intolerance will decrease 07/31/2019 1148 by Hubert Azure, RN Outcome: Progressing 07/31/2019 1148 by Hubert Azure, RN Outcome: Progressing   Problem: Nutrition: Goal: Adequate nutrition will be maintained 07/31/2019 1148 by Hubert Azure, RN Outcome: Progressing 07/31/2019 1148 by Hubert Azure, RN Outcome: Progressing   Problem: Elimination: Goal: Will not experience complications related to bowel motility 07/31/2019 1148 by Hubert Azure, RN Outcome: Progressing 07/31/2019 1148 by Hubert Azure, RN Outcome: Progressing

## 2019-07-31 NOTE — Discharge Instructions (Signed)
Preventing Heart Failure Heart failure is a condition in which the heart has trouble pumping blood because it has become weak or stiff. This may mean that the heart cannot pump enough blood out to the body or that the heart does not fill up with enough blood. Either of those problems can lead to symptoms such as tiredness (fatigue), trouble breathing, and swelling throughout the body. This is a common medical condition that affects not only the heart, but the entire body. Making certain nutrition and lifestyle changes can help you prevent heart failure and avoid serious health problems. How can this condition affect me? Heart failure can cause very serious problems that may get worse over time, such as:  Extreme fatigue during normal physical activities.  Shortness of breath or trouble breathing.  Swelling in your abdomen, legs, ankles, feet, or neck.  Fluid buildup throughout the body.  Weight gain.  Cough.  Frequent urination. What can increase my risk? The risk of heart failure increases as a person ages. The following factors may also make you more likely to develop this condition:  Being overweight.  Being male.  Smoking or chewing tobacco.  Abusing alcohol or drugs.  Having taken medicines that can damage the heart, such as chemotherapy drugs.  Having any of these medical conditions: ? Diabetes. ? Abnormal heart rhythms. ? Thyroid problems. ? Low blood counts (anemia). What actions can I take to prevent heart failure?     Nutrition  If you are overweight or obese, reduce how many calories you eat each day so that you lose weight. Work with your health care provider or a diet and nutrition specialist (dietitian) to determine how many calories you need each day.  Eat foods that are low in salt (sodium). Avoid adding extra salt to foods. This can help keep your blood pressure in a normal range.  Eat a well-balanced diet that includes a lot of: ? Fresh fruits and  vegetables. ? Whole grains. ? Lean meats. ? Beans. ? Fat-free or low-fat dairy products.  Avoid foods that contain a lot of: ? Trans fats. ? Saturated fats. ? Sugar. ? Cholesterol. Alcohol  Do not drink alcohol if: ? Your health care provider tells you not to drink. ? You are pregnant, may be pregnant, or are planning to become pregnant.  If you drink alcohol: ? Limit how much you use to:  0-1 drink a day for women.  0-2 drinks a day for men. ? Be aware of how much alcohol is in your drink. In the [place] one drink equals one 12 oz bottle of beer (355 mL), one 5 oz glass of wine (148 mL), or one 1 oz glass of hard liquor (44 mL). Lifestyle  Do not use any products that contain nicotine or tobacco, such as cigarettes, e-cigarettes, and chewing tobacco. If you need help quitting, ask your health care provider.  Exercise for at least 150 minutes each week, or as much as told by your health care provider. ? Do moderate-intensity exercise, such as brisk walking, bicycling, or water aerobics. ? Ask your health care provider which activities are safe for you.  Try to get 7-9 hours of sleep each night. To help with sleep: ? Keep your bedroom cool and dark. ? Do not eat a heavy meal during the hour before you go to bed. ? Do not drink alcohol or caffeinated drinks before bed. ? Avoid screen time before bedtime. This means avoiding television, computers, tablets, and mobile phones.    Find ways to relax and manage stress. These may include: ? Breathing exercises. ? Meditation. ? Yoga. ? Listening to music. General instructions  See a health care provider regularly for screening and wellness checks. Work with your health care provider to manage your: ? Blood pressure. ? Cholesterol levels. ? Blood sugar (glucose) levels. ? Weight and BMI.  If you have diabetes, manage your condition and follow your treatment plan as instructed. Where to find more information  National Heart,  Lung, and Blood Institute: www.nhlbi.nih.gov  Centers for Disease Control and Prevention: www.cdc.gov  National Institute on Aging: www.nia.nih.gov  American Heart Association: www.heart.org Contact a health care provider if:  You have rapid weight gain.  You have increasing shortness of breath that is unusual for you.  You tire easily or you are unable to participate in your usual activities.  You cough more than normal, especially with physical activity.  You have any swelling or more swelling in areas such as your hands, feet, ankles, or abdomen. Get help right away if you have:  Trouble breathing.  Pain or discomfort in your chest.  An episode of fainting. These symptoms may represent a serious problem that is an emergency. Do not wait to see if the symptoms will go away. Get medical help right away. Call your local emergency services (911 in the [place] Do not drive yourself to the hospital. Summary  Heart failure can cause very serious problems over time.  Heart failure can be prevented by making changes to your diet and your lifestyle.  It is important to eat a healthy diet, manage your weight, exercise regularly, manage stress, avoid drugs and alcohol, and keep your cholesterol and blood pressure under control. This information is not intended to replace advice given to you by your health care provider. Make sure you discuss any questions you have with your health care provider. Document Revised: 02/14/2019 Document Reviewed: 02/14/2019 Elsevier Patient Education  2020 [employer] Heart Failure, Diagnosis  Heart failure means that your heart is not able to pump blood in the right way. This makes it hard for your body to work well. Heart failure is usually a long-term (chronic) condition. You must take good care of yourself and follow your treatment plan from your doctor. What are the causes? This condition may be caused by:  High blood pressure.  Build up of  cholesterol and fat in the arteries.  Heart attack. This injures the heart muscle.  Heart valves that do not open and close properly.  Damage of the heart muscle. This is also called cardiomyopathy.  Lung disease.  Abnormal heart rhythms. What increases the risk? The risk of heart failure goes up as a person ages. This condition is also more likely to develop in people who:  Are overweight.  Are male.  Smoke or chew tobacco.  Abuse alcohol or illegal drugs.  Have taken medicines that can damage the heart.  Have diabetes.  Have abnormal heart rhythms.  Have thyroid problems.  Have low blood counts (anemia). What are the signs or symptoms? Symptoms of this condition include:  Shortness of breath.  Coughing.  Swelling of the feet, ankles, legs, or belly.  Losing weight for no reason.  Trouble breathing.  Waking from sleep because of the need to sit up and get more air.  Rapid heartbeat.  Being very tired.  Feeling dizzy, or feeling like you may pass out (faint).  Having no desire to eat.  Feeling like you may vomit (nauseous).    Peeing (urinating) more at night.  Feeling confused. How is this treated?     This condition may be treated with:  Medicines. These can be given to treat blood pressure and to make the heart muscles stronger.  Changes in your daily life. These may include eating a healthy diet, staying at a healthy body weight, quitting tobacco and illegal drug use, or doing exercises.  Surgery. Surgery can be done to open blocked valves, or to put devices in the heart, such as pacemakers.  A donor heart (heart transplant). You will receive a healthy heart from a donor. Follow these instructions at home:  Treat other conditions as told by your doctor. These may include high blood pressure, diabetes, thyroid disease, or abnormal heart rhythms.  Learn as much as you can about heart failure.  Get support as you need it.  Keep all  follow-up visits as told by your doctor. This is important. Summary  Heart failure means that your heart is not able to pump blood in the right way.  This condition is caused by high blood pressure, heart attack, or damage of the heart muscle.  Symptoms of this condition include shortness of breath and swelling of the feet, ankles, legs, or belly. You may also feel very tired or feel like you may vomit.  You may be treated with medicines, surgery, or changes in your daily life.  Treat other health conditions as told by your doctor. This information is not intended to replace advice given to you by your health care provider. Make sure you discuss any questions you have with your health care provider. Document Revised: 10/04/2018 Document Reviewed: 10/04/2018 Elsevier Patient Education  2020 Elsevier Inc.  

## 2019-07-31 NOTE — Progress Notes (Signed)
Inpatient Diabetes Program Recommendations  AACE/ADA: New Consensus Statement on Inpatient Glycemic Control (2015)  Target Ranges:  Prepandial:   less than 140 mg/dL      Peak postprandial:   less than 180 mg/dL (1-2 hours)      Critically ill patients:  140 - 180 mg/dL   Lab Results  Component Value Date   GLUCAP 113 (H) 07/31/2019   HGBA1C 9.2 (H) 07/30/2019    Review of Glycemic Control  Diabetes history: DM2 Outpatient Diabetes medications: Basaglar 150 units QAM Current orders for Inpatient glycemic control: Lantus 150 units QAM, Novolog 0-15 units tidwc and 0-5 units QHS  HgbA1C - 9.2% - improved from 12.3% on 02/04/2019.  Inpatient Diabetes Program Recommendations:     Spoke with pt regarding his insulin dose at home and HgbA1C of 9.2%. Pt states he wakes up at 2 am sometimes and feels very weak, shaky and sweaty. Eats fruit and feels better. Has not been checking blood sugars as meter is broken. I explained to him that this sounds like hypoglycemia. Discussed s/s and treatment of hypoglycemia. Stressed importance of checking blood sugar when this happens. Also needs to check sugars 2-3x/day and send blood sugar log to MD to review and make adjustments to insulin. Explained that hypoglycemia can be dangerous and he needs to let his doctor know how often this is happening. Answered questions and stressed importance of f/u with Endo, may need both basal and bolus insulin.  To be d/ced with prescription for meter and supplies.  Thank you. Lorenda Peck, RD, LDN, CDE Inpatient Diabetes Coordinator [phone]

## 2019-08-04 ENCOUNTER — Telehealth: Payer: Self-pay | Admitting: *Deleted

## 2019-08-04 NOTE — Telephone Encounter (Signed)
Unable to reach pt

## 2019-08-18 ENCOUNTER — Other Ambulatory Visit: Payer: Self-pay | Admitting: Family Medicine

## 2019-08-18 ENCOUNTER — Other Ambulatory Visit (HOSPITAL_COMMUNITY): Payer: Self-pay | Admitting: Internal Medicine

## 2019-08-18 DIAGNOSIS — I10 Essential (primary) hypertension: Secondary | ICD-10-CM

## 2019-08-18 DIAGNOSIS — J309 Allergic rhinitis, unspecified: Secondary | ICD-10-CM

## 2019-08-18 DIAGNOSIS — I5022 Chronic systolic (congestive) heart failure: Secondary | ICD-10-CM

## 2019-08-18 DIAGNOSIS — E782 Mixed hyperlipidemia: Secondary | ICD-10-CM

## 2019-10-12 ENCOUNTER — Other Ambulatory Visit (HOSPITAL_COMMUNITY): Payer: Self-pay | Admitting: Internal Medicine

## 2019-10-12 ENCOUNTER — Other Ambulatory Visit: Payer: Self-pay | Admitting: Family Medicine

## 2019-10-12 ENCOUNTER — Emergency Department (HOSPITAL_COMMUNITY)
Admission: EM | Admit: 2019-10-12 | Discharge: 2019-10-12 | Disposition: A | Discharge status: Home / Self Care | Payer: No Typology Code available for payment source | Attending: Emergency Medicine | Admitting: Emergency Medicine

## 2019-10-12 ENCOUNTER — Encounter (HOSPITAL_COMMUNITY): Payer: Self-pay

## 2019-10-12 DIAGNOSIS — I110 Hypertensive heart disease with heart failure: Secondary | ICD-10-CM | POA: Insufficient documentation

## 2019-10-12 DIAGNOSIS — E782 Mixed hyperlipidemia: Secondary | ICD-10-CM

## 2019-10-12 DIAGNOSIS — T25221A Burn of second degree of right foot, initial encounter: Secondary | ICD-10-CM | POA: Insufficient documentation

## 2019-10-12 DIAGNOSIS — I252 Old myocardial infarction: Secondary | ICD-10-CM | POA: Insufficient documentation

## 2019-10-12 DIAGNOSIS — I5022 Chronic systolic (congestive) heart failure: Secondary | ICD-10-CM

## 2019-10-12 DIAGNOSIS — I10 Essential (primary) hypertension: Secondary | ICD-10-CM

## 2019-10-12 DIAGNOSIS — Y92019 Unspecified place in single-family (private) house as the place of occurrence of the external cause: Secondary | ICD-10-CM | POA: Diagnosis not present

## 2019-10-12 DIAGNOSIS — Y999 Unspecified external cause status: Secondary | ICD-10-CM | POA: Insufficient documentation

## 2019-10-12 DIAGNOSIS — X118XXA Contact with other hot tap-water, initial encounter: Secondary | ICD-10-CM | POA: Diagnosis not present

## 2019-10-12 DIAGNOSIS — E119 Type 2 diabetes mellitus without complications: Secondary | ICD-10-CM | POA: Insufficient documentation

## 2019-10-12 DIAGNOSIS — Y93E8 Activity, other personal hygiene: Secondary | ICD-10-CM | POA: Insufficient documentation

## 2019-10-12 DIAGNOSIS — F319 Bipolar disorder, unspecified: Secondary | ICD-10-CM | POA: Insufficient documentation

## 2019-10-12 DIAGNOSIS — T25222A Burn of second degree of left foot, initial encounter: Secondary | ICD-10-CM | POA: Insufficient documentation

## 2019-10-12 DIAGNOSIS — T25229A Burn of second degree of unspecified foot, initial encounter: Secondary | ICD-10-CM

## 2019-10-12 MED ORDER — SILVER SULFADIAZINE 1 % EX CREA: 1.0000 "application " | TOPICAL_CREAM | Freq: Every day | CUTANEOUS | 0 refills | Status: DC

## 2019-10-12 MED ORDER — CEPHALEXIN 250 MG PO CAPS
1000.0000 mg | ORAL_CAPSULE | Freq: Once | ORAL | Status: AC
  Administered 2019-10-12: 1000 mg via ORAL
  Filled 2019-10-12: qty 4

## 2019-10-12 MED ORDER — OXYCODONE-ACETAMINOPHEN 5-325 MG PO TABS: 1.0000 | ORAL_TABLET | ORAL | 0 refills | Status: DC | PRN

## 2019-10-12 MED ORDER — CEPHALEXIN 500 MG PO CAPS: 500.0000 mg | ORAL_CAPSULE | Freq: Four times a day (QID) | ORAL | 0 refills | Status: DC

## 2019-10-12 MED ORDER — SILVER SULFADIAZINE 1 % EX CREA
TOPICAL_CREAM | Freq: Once | CUTANEOUS | Status: AC
  Administered 2019-10-12: 1 via TOPICAL
  Filled 2019-10-12: qty 85

## 2019-10-12 NOTE — ED Notes (Signed)
Pt was discharged from the ED. Pt read and understood discharge paperwork. Pt had vital signs completed. Pt conscious, breathing, and A&Ox4. No distress noted. Pt speaking in complete sentences. Pt brought out of the ED via wheelchair. E-signature not available. 
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
## 2019-10-13 NOTE — Telephone Encounter (Signed)
Please contact patient. He needs an appt with [facility]

## 2019-10-14 ENCOUNTER — Ambulatory Visit (INDEPENDENT_AMBULATORY_CARE_PROVIDER_SITE_OTHER): Payer: No Typology Code available for payment source

## 2019-10-14 ENCOUNTER — Other Ambulatory Visit: Payer: Self-pay

## 2019-10-14 ENCOUNTER — Encounter: Payer: Self-pay | Admitting: Sports Medicine

## 2019-10-14 ENCOUNTER — Ambulatory Visit (INDEPENDENT_AMBULATORY_CARE_PROVIDER_SITE_OTHER): Payer: No Typology Code available for payment source | Admitting: Sports Medicine

## 2019-10-14 ENCOUNTER — Other Ambulatory Visit: Payer: Self-pay | Admitting: Sports Medicine

## 2019-10-14 DIAGNOSIS — M79671 Pain in right foot: Secondary | ICD-10-CM | POA: Diagnosis not present

## 2019-10-14 DIAGNOSIS — T300 Burn of unspecified body region, unspecified degree: Secondary | ICD-10-CM

## 2019-10-14 DIAGNOSIS — S90829A Blister (nonthermal), unspecified foot, initial encounter: Secondary | ICD-10-CM

## 2019-10-14 DIAGNOSIS — E1142 Type 2 diabetes mellitus with diabetic polyneuropathy: Secondary | ICD-10-CM

## 2019-10-14 DIAGNOSIS — M79672 Pain in left foot: Secondary | ICD-10-CM

## 2019-10-14 DIAGNOSIS — T25229A Burn of second degree of unspecified foot, initial encounter: Secondary | ICD-10-CM

## 2019-10-14 NOTE — Telephone Encounter (Signed)
Attempted to reach out to patient to schedule overdue f/u appt with [facility] ([facility]).  No answer; left voicemail for patient to return call to schedule appt.
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
## 2019-10-15 ENCOUNTER — Telehealth: Payer: Self-pay | Admitting: *Deleted
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
## 2019-10-15 NOTE — Telephone Encounter (Signed)
Faxed required form, clinicals and demographics with orders to [facility].

## 2019-10-16 ENCOUNTER — Telehealth: Payer: Self-pay | Admitting: *Deleted

## 2019-10-16 NOTE — Telephone Encounter (Signed)
I spoke with pt and he states the [facility] can not accept him due to his insurance and his wife and dtr were unable to dress properly and he wants to be seen. I told pt I would look for another [facility] agency and would have scheduler call to set appt for tomorrow. [facility] - Scheduler contacted pt to schedule with Dr. Allena Katz.

## 2019-10-16 NOTE — Telephone Encounter (Signed)
Faxed required form, clinicals and demographics to [facility]. 

## 2019-10-16 NOTE — Telephone Encounter (Signed)
Pt called states he needs someone to take care of his feet.

## 2019-10-17 ENCOUNTER — Telehealth: Payer: Self-pay

## 2019-10-17 ENCOUNTER — Ambulatory Visit (INDEPENDENT_AMBULATORY_CARE_PROVIDER_SITE_OTHER): Payer: No Typology Code available for payment source | Admitting: Podiatry

## 2019-10-17 ENCOUNTER — Other Ambulatory Visit: Payer: Self-pay

## 2019-10-17 DIAGNOSIS — S90829A Blister (nonthermal), unspecified foot, initial encounter: Secondary | ICD-10-CM

## 2019-10-17 DIAGNOSIS — E1142 Type 2 diabetes mellitus with diabetic polyneuropathy: Secondary | ICD-10-CM | POA: Diagnosis not present

## 2019-10-17 DIAGNOSIS — T300 Burn of unspecified body region, unspecified degree: Secondary | ICD-10-CM

## 2019-10-17 LAB — WOUND CULTURE
MICRO NUMBER:: 10256920
SPECIMEN QUALITY:: ADEQUATE

## 2019-10-17 NOTE — Telephone Encounter (Signed)
Brittney form [facility] called requesting wound orders for [name]. Wound orders were faxed to [facility] at [phone]

## 2019-10-19 ENCOUNTER — Other Ambulatory Visit: Payer: Self-pay | Admitting: Family Medicine

## 2019-10-19 ENCOUNTER — Other Ambulatory Visit (HOSPITAL_COMMUNITY): Payer: Self-pay | Admitting: Internal Medicine

## 2019-10-19 DIAGNOSIS — I10 Essential (primary) hypertension: Secondary | ICD-10-CM

## 2019-10-19 DIAGNOSIS — I5022 Chronic systolic (congestive) heart failure: Secondary | ICD-10-CM

## 2019-10-19 DIAGNOSIS — J309 Allergic rhinitis, unspecified: Secondary | ICD-10-CM

## 2019-10-20 ENCOUNTER — Telehealth: Payer: Self-pay | Admitting: Podiatry

## 2019-10-20 ENCOUNTER — Emergency Department (HOSPITAL_COMMUNITY): Payer: No Typology Code available for payment source

## 2019-10-20 ENCOUNTER — Encounter: Payer: Self-pay | Admitting: Podiatry

## 2019-10-20 ENCOUNTER — Telehealth: Payer: Self-pay | Admitting: *Deleted

## 2019-10-20 ENCOUNTER — Encounter (HOSPITAL_COMMUNITY): Payer: Self-pay | Admitting: Emergency Medicine

## 2019-10-20 ENCOUNTER — Other Ambulatory Visit: Payer: Self-pay

## 2019-10-20 ENCOUNTER — Inpatient Hospital Stay (HOSPITAL_COMMUNITY)
Admission: EM | Admit: 2019-10-20 | Discharge: 2019-11-03 | DRG: 928 | Disposition: A | Discharge status: Home Health | Payer: No Typology Code available for payment source | Attending: Internal Medicine | Admitting: Internal Medicine

## 2019-10-20 DIAGNOSIS — Z20822 Contact with and (suspected) exposure to covid-19: Secondary | ICD-10-CM | POA: Diagnosis present

## 2019-10-20 DIAGNOSIS — Z87892 Personal history of anaphylaxis: Secondary | ICD-10-CM

## 2019-10-20 DIAGNOSIS — B9689 Other specified bacterial agents as the cause of diseases classified elsewhere: Secondary | ICD-10-CM | POA: Diagnosis present

## 2019-10-20 DIAGNOSIS — F4310 Post-traumatic stress disorder, unspecified: Secondary | ICD-10-CM | POA: Diagnosis present

## 2019-10-20 DIAGNOSIS — T25322A Burn of third degree of left foot, initial encounter: Secondary | ICD-10-CM | POA: Diagnosis not present

## 2019-10-20 DIAGNOSIS — I5022 Chronic systolic (congestive) heart failure: Secondary | ICD-10-CM

## 2019-10-20 DIAGNOSIS — D649 Anemia, unspecified: Secondary | ICD-10-CM | POA: Diagnosis not present

## 2019-10-20 DIAGNOSIS — E875 Hyperkalemia: Secondary | ICD-10-CM | POA: Diagnosis not present

## 2019-10-20 DIAGNOSIS — I110 Hypertensive heart disease with heart failure: Secondary | ICD-10-CM | POA: Diagnosis present

## 2019-10-20 DIAGNOSIS — I5023 Acute on chronic systolic (congestive) heart failure: Secondary | ICD-10-CM | POA: Diagnosis present

## 2019-10-20 DIAGNOSIS — F209 Schizophrenia, unspecified: Secondary | ICD-10-CM | POA: Diagnosis present

## 2019-10-20 DIAGNOSIS — I252 Old myocardial infarction: Secondary | ICD-10-CM

## 2019-10-20 DIAGNOSIS — T25229S Burn of second degree of unspecified foot, sequela: Secondary | ICD-10-CM

## 2019-10-20 DIAGNOSIS — I428 Other cardiomyopathies: Secondary | ICD-10-CM | POA: Diagnosis present

## 2019-10-20 DIAGNOSIS — F319 Bipolar disorder, unspecified: Secondary | ICD-10-CM | POA: Diagnosis present

## 2019-10-20 DIAGNOSIS — I5043 Acute on chronic combined systolic (congestive) and diastolic (congestive) heart failure: Secondary | ICD-10-CM | POA: Diagnosis present

## 2019-10-20 DIAGNOSIS — I959 Hypotension, unspecified: Secondary | ICD-10-CM | POA: Diagnosis not present

## 2019-10-20 DIAGNOSIS — Z833 Family history of diabetes mellitus: Secondary | ICD-10-CM

## 2019-10-20 DIAGNOSIS — I69354 Hemiplegia and hemiparesis following cerebral infarction affecting left non-dominant side: Secondary | ICD-10-CM

## 2019-10-20 DIAGNOSIS — I10 Essential (primary) hypertension: Secondary | ICD-10-CM | POA: Diagnosis present

## 2019-10-20 DIAGNOSIS — I2510 Atherosclerotic heart disease of native coronary artery without angina pectoris: Secondary | ICD-10-CM | POA: Diagnosis present

## 2019-10-20 DIAGNOSIS — Y92009 Unspecified place in unspecified non-institutional (private) residence as the place of occurrence of the external cause: Secondary | ICD-10-CM

## 2019-10-20 DIAGNOSIS — E66813 Obesity, class 3: Secondary | ICD-10-CM | POA: Diagnosis present

## 2019-10-20 DIAGNOSIS — I272 Pulmonary hypertension, unspecified: Secondary | ICD-10-CM | POA: Diagnosis present

## 2019-10-20 DIAGNOSIS — Z794 Long term (current) use of insulin: Secondary | ICD-10-CM

## 2019-10-20 DIAGNOSIS — I509 Heart failure, unspecified: Secondary | ICD-10-CM

## 2019-10-20 DIAGNOSIS — Z8701 Personal history of pneumonia (recurrent): Secondary | ICD-10-CM

## 2019-10-20 DIAGNOSIS — L089 Local infection of the skin and subcutaneous tissue, unspecified: Secondary | ICD-10-CM | POA: Diagnosis present

## 2019-10-20 DIAGNOSIS — E1165 Type 2 diabetes mellitus with hyperglycemia: Secondary | ICD-10-CM

## 2019-10-20 DIAGNOSIS — G4733 Obstructive sleep apnea (adult) (pediatric): Secondary | ICD-10-CM | POA: Diagnosis present

## 2019-10-20 DIAGNOSIS — I255 Ischemic cardiomyopathy: Secondary | ICD-10-CM | POA: Diagnosis present

## 2019-10-20 DIAGNOSIS — Z91128 Patient's intentional underdosing of medication regimen for other reason: Secondary | ICD-10-CM

## 2019-10-20 DIAGNOSIS — Z9114 Patient's other noncompliance with medication regimen: Secondary | ICD-10-CM

## 2019-10-20 DIAGNOSIS — T148XXA Other injury of unspecified body region, initial encounter: Secondary | ICD-10-CM | POA: Diagnosis present

## 2019-10-20 DIAGNOSIS — Z6838 Body mass index (BMI) 38.0-38.9, adult: Secondary | ICD-10-CM

## 2019-10-20 DIAGNOSIS — Z8249 Family history of ischemic heart disease and other diseases of the circulatory system: Secondary | ICD-10-CM

## 2019-10-20 DIAGNOSIS — T502X6A Underdosing of carbonic-anhydrase inhibitors, benzothiadiazides and other diuretics, initial encounter: Secondary | ICD-10-CM | POA: Diagnosis present

## 2019-10-20 DIAGNOSIS — X118XXA Contact with other hot tap-water, initial encounter: Secondary | ICD-10-CM | POA: Diagnosis present

## 2019-10-20 DIAGNOSIS — T25321A Burn of third degree of right foot, initial encounter: Secondary | ICD-10-CM | POA: Diagnosis present

## 2019-10-20 DIAGNOSIS — N179 Acute kidney failure, unspecified: Secondary | ICD-10-CM | POA: Diagnosis present

## 2019-10-20 DIAGNOSIS — Z79899 Other long term (current) drug therapy: Secondary | ICD-10-CM

## 2019-10-20 DIAGNOSIS — E785 Hyperlipidemia, unspecified: Secondary | ICD-10-CM | POA: Diagnosis present

## 2019-10-20 DIAGNOSIS — Z888 Allergy status to other drugs, medicaments and biological substances status: Secondary | ICD-10-CM

## 2019-10-20 HISTORY — DX: Pulmonary hypertension, unspecified: I27.20

## 2019-10-20 HISTORY — DX: Atherosclerotic heart disease of native coronary artery without angina pectoris: I25.10

## 2019-10-20 HISTORY — DX: Chronic systolic (congestive) heart failure: I50.22

## 2019-10-20 LAB — BASIC METABOLIC PANEL
Anion gap: 14 (ref 5–15)
BUN: 18 mg/dL (ref 6–20)
CO2: 26 mmol/L (ref 22–32)
Calcium: 8.5 mg/dL — ABNORMAL LOW (ref 8.9–10.3)
Chloride: 100 mmol/L (ref 98–111)
Creatinine, Ser: 1.29 mg/dL — ABNORMAL HIGH (ref 0.61–1.24)
GFR calc Af Amer: >60 mL/min (ref >60)
GFR calc non Af Amer: >60 mL/min (ref >60)
Glucose, Bld: 106 mg/dL — ABNORMAL HIGH (ref 70–99)
Potassium: 3.6 mmol/L (ref 3.5–5.1)
Sodium: 140 mmol/L (ref 135–145)

## 2019-10-20 LAB — CBC
HCT: 39.8 % (ref 39.0–52.0)
Hemoglobin: 12.8 g/dL — ABNORMAL LOW (ref 13.0–17.0)
MCH: 29.5 pg (ref 26.0–34.0)
MCHC: 32.2 g/dL (ref 30.0–36.0)
MCV: 91.7 fL (ref 80.0–100.0)
Platelets: 363 10*3/uL (ref 150–400)
RBC: 4.34 MIL/uL (ref 4.22–5.81)
RDW: 12 % (ref 11.5–15.5)
WBC: 16.3 10*3/uL — ABNORMAL HIGH (ref 4.0–10.5)
nRBC: 0 % (ref 0.0–0.2)

## 2019-10-20 LAB — BRAIN NATRIURETIC PEPTIDE: B Natriuretic Peptide: 274.3 pg/mL — ABNORMAL HIGH (ref 0.0–100.0)

## 2019-10-20 LAB — TROPONIN I (HIGH SENSITIVITY): Troponin I (High Sensitivity): 17 ng/L (ref <18)

## 2019-10-20 MED ORDER — ACETAMINOPHEN-CODEINE #3 300-30 MG PO TABS: 1.0000 | ORAL_TABLET | Freq: Four times a day (QID) | ORAL | 0 refills | Status: DC | PRN

## 2019-10-20 MED ORDER — SODIUM CHLORIDE 0.9% FLUSH
3.0000 mL | Freq: Once | INTRAVENOUS | Status: AC
  Administered 2019-10-21: 3 mL via INTRAVENOUS

## 2019-10-20 NOTE — Telephone Encounter (Signed)
I informed pt of Dr. Eliane Decree tylenol #3 orders.

## 2019-10-20 NOTE — Telephone Encounter (Signed)
[facility] - D. Janina Mayo states they are unable to accept pt in [facility].

## 2019-10-20 NOTE — Telephone Encounter (Signed)
Pt states he called back and got his appt time.

## 2019-10-20 NOTE — Telephone Encounter (Signed)
Pt. Would like to have pain med called in, he said the Oxycodone was too strong and wants something else called in other than that, for pain.

## 2019-10-20 NOTE — Telephone Encounter (Signed)
Pt is requesting pain medication but something not as strong as the oxycodone.

## 2019-10-20 NOTE — Telephone Encounter (Signed)
Pt states he has an appt Wednesday at 3:00pm this week.
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
## 2019-10-20 NOTE — Addendum Note (Signed)
Addended by: Nicholes Rough on: 10/20/2019 03:56 PM   Modules accepted: Orders

## 2019-10-20 NOTE — Telephone Encounter (Signed)
I will send Tylenol 3 to the pharmacy thank you

## 2019-10-20 NOTE — ED Triage Notes (Addendum)
Patient reports worsening SOB with productive cough and chest congestion/chest tightness for several days , denies fever or chills , history of CHF.

## 2019-10-20 NOTE — Telephone Encounter (Signed)
Required form, clinicals and demographics to [facility].

## 2019-10-21 ENCOUNTER — Encounter (HOSPITAL_COMMUNITY): Payer: Self-pay | Admitting: Internal Medicine

## 2019-10-21 ENCOUNTER — Telehealth: Payer: Self-pay | Admitting: *Deleted

## 2019-10-21 ENCOUNTER — Telehealth: Payer: Self-pay | Admitting: Podiatry

## 2019-10-21 DIAGNOSIS — T25229S Burn of second degree of unspecified foot, sequela: Secondary | ICD-10-CM | POA: Diagnosis not present

## 2019-10-21 DIAGNOSIS — G4733 Obstructive sleep apnea (adult) (pediatric): Secondary | ICD-10-CM | POA: Diagnosis present

## 2019-10-21 DIAGNOSIS — X118XXA Contact with other hot tap-water, initial encounter: Secondary | ICD-10-CM | POA: Diagnosis present

## 2019-10-21 DIAGNOSIS — Y92009 Unspecified place in unspecified non-institutional (private) residence as the place of occurrence of the external cause: Secondary | ICD-10-CM | POA: Diagnosis not present

## 2019-10-21 DIAGNOSIS — B9689 Other specified bacterial agents as the cause of diseases classified elsewhere: Secondary | ICD-10-CM | POA: Diagnosis present

## 2019-10-21 DIAGNOSIS — T25322S Burn of third degree of left foot, sequela: Secondary | ICD-10-CM | POA: Diagnosis not present

## 2019-10-21 DIAGNOSIS — I69354 Hemiplegia and hemiparesis following cerebral infarction affecting left non-dominant side: Secondary | ICD-10-CM | POA: Diagnosis not present

## 2019-10-21 DIAGNOSIS — I509 Heart failure, unspecified: Secondary | ICD-10-CM | POA: Diagnosis present

## 2019-10-21 DIAGNOSIS — T25322A Burn of third degree of left foot, initial encounter: Secondary | ICD-10-CM | POA: Diagnosis present

## 2019-10-21 DIAGNOSIS — F209 Schizophrenia, unspecified: Secondary | ICD-10-CM | POA: Diagnosis present

## 2019-10-21 DIAGNOSIS — X118XXS Contact with other hot tap-water, sequela: Secondary | ICD-10-CM | POA: Diagnosis not present

## 2019-10-21 DIAGNOSIS — I255 Ischemic cardiomyopathy: Secondary | ICD-10-CM | POA: Diagnosis present

## 2019-10-21 DIAGNOSIS — Z9114 Patient's other noncompliance with medication regimen: Secondary | ICD-10-CM | POA: Diagnosis not present

## 2019-10-21 DIAGNOSIS — L0390 Cellulitis, unspecified: Secondary | ICD-10-CM | POA: Diagnosis not present

## 2019-10-21 DIAGNOSIS — I2510 Atherosclerotic heart disease of native coronary artery without angina pectoris: Secondary | ICD-10-CM | POA: Diagnosis present

## 2019-10-21 DIAGNOSIS — T25321A Burn of third degree of right foot, initial encounter: Secondary | ICD-10-CM | POA: Diagnosis present

## 2019-10-21 DIAGNOSIS — T25321S Burn of third degree of right foot, sequela: Secondary | ICD-10-CM | POA: Diagnosis not present

## 2019-10-21 DIAGNOSIS — D649 Anemia, unspecified: Secondary | ICD-10-CM | POA: Diagnosis not present

## 2019-10-21 DIAGNOSIS — I959 Hypotension, unspecified: Secondary | ICD-10-CM | POA: Diagnosis not present

## 2019-10-21 DIAGNOSIS — I428 Other cardiomyopathies: Secondary | ICD-10-CM | POA: Diagnosis present

## 2019-10-21 DIAGNOSIS — I5023 Acute on chronic systolic (congestive) heart failure: Secondary | ICD-10-CM | POA: Diagnosis not present

## 2019-10-21 DIAGNOSIS — T148XXA Other injury of unspecified body region, initial encounter: Secondary | ICD-10-CM | POA: Diagnosis present

## 2019-10-21 DIAGNOSIS — E875 Hyperkalemia: Secondary | ICD-10-CM | POA: Diagnosis not present

## 2019-10-21 DIAGNOSIS — F4310 Post-traumatic stress disorder, unspecified: Secondary | ICD-10-CM | POA: Diagnosis present

## 2019-10-21 DIAGNOSIS — I272 Pulmonary hypertension, unspecified: Secondary | ICD-10-CM | POA: Diagnosis present

## 2019-10-21 DIAGNOSIS — Z01810 Encounter for preprocedural cardiovascular examination: Secondary | ICD-10-CM | POA: Diagnosis not present

## 2019-10-21 DIAGNOSIS — Z20822 Contact with and (suspected) exposure to covid-19: Secondary | ICD-10-CM | POA: Diagnosis present

## 2019-10-21 DIAGNOSIS — I110 Hypertensive heart disease with heart failure: Secondary | ICD-10-CM | POA: Diagnosis present

## 2019-10-21 DIAGNOSIS — L089 Local infection of the skin and subcutaneous tissue, unspecified: Secondary | ICD-10-CM | POA: Diagnosis not present

## 2019-10-21 DIAGNOSIS — F319 Bipolar disorder, unspecified: Secondary | ICD-10-CM | POA: Diagnosis present

## 2019-10-21 DIAGNOSIS — E1165 Type 2 diabetes mellitus with hyperglycemia: Secondary | ICD-10-CM | POA: Diagnosis present

## 2019-10-21 DIAGNOSIS — I10 Essential (primary) hypertension: Secondary | ICD-10-CM | POA: Diagnosis not present

## 2019-10-21 DIAGNOSIS — N179 Acute kidney failure, unspecified: Secondary | ICD-10-CM | POA: Diagnosis present

## 2019-10-21 DIAGNOSIS — I5043 Acute on chronic combined systolic (congestive) and diastolic (congestive) heart failure: Secondary | ICD-10-CM | POA: Diagnosis present

## 2019-10-21 DIAGNOSIS — E785 Hyperlipidemia, unspecified: Secondary | ICD-10-CM | POA: Diagnosis present

## 2019-10-21 DIAGNOSIS — Z6838 Body mass index (BMI) 38.0-38.9, adult: Secondary | ICD-10-CM | POA: Diagnosis not present

## 2019-10-21 LAB — C-REACTIVE PROTEIN: CRP: 13 mg/dL — ABNORMAL HIGH (ref <1.0)

## 2019-10-21 LAB — URINALYSIS, ROUTINE W REFLEX MICROSCOPIC
Bilirubin Urine: NEGATIVE
Glucose, UA: NEGATIVE mg/dL
Hgb urine dipstick: NEGATIVE
Ketones, ur: NEGATIVE mg/dL
Leukocytes,Ua: NEGATIVE
Nitrite: NEGATIVE
Protein, ur: NEGATIVE mg/dL
Specific Gravity, Urine: 1.012 (ref 1.005–1.030)
pH: 5 (ref 5.0–8.0)

## 2019-10-21 LAB — CBG MONITORING, ED
Glucose-Capillary: 114 mg/dL — ABNORMAL HIGH (ref 70–99)
Glucose-Capillary: 108 mg/dL — ABNORMAL HIGH (ref 70–99)

## 2019-10-21 LAB — LACTIC ACID, PLASMA: Lactic Acid, Venous: 1.1 mmol/L (ref 0.5–1.9)

## 2019-10-21 LAB — SEDIMENTATION RATE: Sed Rate: 81 mm/hr — ABNORMAL HIGH (ref 0–16)

## 2019-10-21 LAB — RESPIRATORY PANEL BY RT PCR (FLU A&B, COVID)
Influenza A by PCR: NEGATIVE
Influenza B by PCR: NEGATIVE
SARS Coronavirus 2 by RT PCR: NEGATIVE

## 2019-10-21 LAB — PREALBUMIN: Prealbumin: 6.8 mg/dL — ABNORMAL LOW (ref 18–38)

## 2019-10-21 LAB — HIV ANTIBODY (ROUTINE TESTING W REFLEX): HIV Screen 4th Generation wRfx: NONREACTIVE

## 2019-10-21 LAB — GLUCOSE, CAPILLARY: Glucose-Capillary: 164 mg/dL — ABNORMAL HIGH (ref 70–99)

## 2019-10-21 LAB — PROCALCITONIN: Procalcitonin: 0.14 ng/mL

## 2019-10-21 MED ORDER — FUROSEMIDE 10 MG/ML IJ SOLN
40.0000 mg | Freq: Once | INTRAMUSCULAR | Status: AC
  Administered 2019-10-21: 06:00:00 40 mg via INTRAVENOUS
  Filled 2019-10-21: qty 4

## 2019-10-21 MED ORDER — OXYCODONE-ACETAMINOPHEN 5-325 MG PO TABS
1.0000 | ORAL_TABLET | ORAL | Status: DC | PRN
  Administered 2019-10-25 – 2019-11-03 (×25): 1 via ORAL
  Filled 2019-10-21 (×26): qty 1

## 2019-10-21 MED ORDER — CARVEDILOL 6.25 MG PO TABS
9.3750 mg | ORAL_TABLET | Freq: Two times a day (BID) | ORAL | Status: DC
  Administered 2019-10-21 – 2019-11-03 (×23): 9.375 mg via ORAL
  Filled 2019-10-21 (×27): qty 1

## 2019-10-21 MED ORDER — ACETAMINOPHEN 325 MG PO TABS
650.0000 mg | ORAL_TABLET | ORAL | Status: DC | PRN
  Administered 2019-10-22 – 2019-10-24 (×3): 650 mg via ORAL
  Filled 2019-10-21 (×4): qty 2

## 2019-10-21 MED ORDER — SODIUM CHLORIDE 0.9% FLUSH
3.0000 mL | Freq: Two times a day (BID) | INTRAVENOUS | Status: DC
  Administered 2019-10-21 – 2019-10-30 (×14): 3 mL via INTRAVENOUS

## 2019-10-21 MED ORDER — VANCOMYCIN HCL 1750 MG/350ML IV SOLN
1750.0000 mg | Freq: Two times a day (BID) | INTRAVENOUS | Status: DC
  Administered 2019-10-22 (×2): 1750 mg via INTRAVENOUS
  Filled 2019-10-21 (×3): qty 350

## 2019-10-21 MED ORDER — INSULIN ASPART 100 UNIT/ML ~~LOC~~ SOLN
0.0000 [IU] | Freq: Three times a day (TID) | SUBCUTANEOUS | Status: DC
  Administered 2019-10-22: 2 [IU] via SUBCUTANEOUS
  Administered 2019-10-22 (×2): 3 [IU] via SUBCUTANEOUS

## 2019-10-21 MED ORDER — ALUM & MAG HYDROXIDE-SIMETH 200-200-20 MG/5ML PO SUSP
30.0000 mL | ORAL | Status: DC | PRN
  Administered 2019-10-21: 30 mL via ORAL
  Filled 2019-10-21: qty 30

## 2019-10-21 MED ORDER — INSULIN GLARGINE 100 UNIT/ML ~~LOC~~ SOLN
60.0000 [IU] | Freq: Every day | SUBCUTANEOUS | Status: DC
  Administered 2019-10-22 – 2019-11-03 (×12): 60 [IU] via SUBCUTANEOUS
  Filled 2019-10-21 (×13): qty 0.6

## 2019-10-21 MED ORDER — VANCOMYCIN HCL 10 G IV SOLR
2500.0000 mg | Freq: Once | INTRAVENOUS | Status: AC
  Administered 2019-10-21: 2500 mg via INTRAVENOUS
  Filled 2019-10-21: qty 2500

## 2019-10-21 MED ORDER — INSULIN GLARGINE 100 UNIT/ML ~~LOC~~ SOLN
120.0000 [IU] | Freq: Every day | SUBCUTANEOUS | Status: DC
  Filled 2019-10-21: qty 1.2

## 2019-10-21 MED ORDER — PIPERACILLIN-TAZOBACTAM 3.375 G IVPB 30 MIN
3.3750 g | Freq: Once | INTRAVENOUS | Status: AC
  Administered 2019-10-21: 3.375 g via INTRAVENOUS
  Filled 2019-10-21: qty 50

## 2019-10-21 MED ORDER — VANCOMYCIN HCL IN DEXTROSE 1-5 GM/200ML-% IV SOLN: 1000.0000 mg | Freq: Once | INTRAVENOUS | Status: DC

## 2019-10-21 MED ORDER — ATORVASTATIN CALCIUM 80 MG PO TABS
80.0000 mg | ORAL_TABLET | Freq: Every day | ORAL | Status: DC
  Administered 2019-10-21 – 2019-11-02 (×13): 80 mg via ORAL
  Filled 2019-10-21 (×12): qty 1
  Filled 2019-10-21: qty 2
  Filled 2019-10-21 (×2): qty 1

## 2019-10-21 MED ORDER — BASAGLAR KWIKPEN 100 UNIT/ML ~~LOC~~ SOPN: 120.0000 [IU] | PEN_INJECTOR | Freq: Every day | SUBCUTANEOUS | Status: DC

## 2019-10-21 MED ORDER — FUROSEMIDE 10 MG/ML IJ SOLN
40.0000 mg | Freq: Two times a day (BID) | INTRAMUSCULAR | Status: DC
  Administered 2019-10-21 – 2019-10-22 (×2): 40 mg via INTRAVENOUS
  Filled 2019-10-21 (×3): qty 4

## 2019-10-21 MED ORDER — SODIUM CHLORIDE 0.9 % IV SOLN
250.0000 mL | INTRAVENOUS | Status: DC | PRN
  Administered 2019-10-21 – 2019-10-30 (×8): 250 mL via INTRAVENOUS

## 2019-10-21 MED ORDER — SACUBITRIL-VALSARTAN 97-103 MG PO TABS
1.0000 | ORAL_TABLET | Freq: Two times a day (BID) | ORAL | Status: DC
  Administered 2019-10-22 (×2): 1 via ORAL
  Filled 2019-10-21 (×5): qty 1

## 2019-10-21 MED ORDER — INSULIN ASPART 100 UNIT/ML ~~LOC~~ SOLN
0.0000 [IU] | Freq: Every day | SUBCUTANEOUS | Status: DC
  Administered 2019-10-23 – 2019-10-24 (×2): 2 [IU] via SUBCUTANEOUS

## 2019-10-21 MED ORDER — HEPARIN SODIUM (PORCINE) 5000 UNIT/ML IJ SOLN
5000.0000 [IU] | Freq: Three times a day (TID) | INTRAMUSCULAR | Status: DC
  Administered 2019-10-21 – 2019-11-03 (×37): 5000 [IU] via SUBCUTANEOUS
  Filled 2019-10-21 (×37): qty 1

## 2019-10-21 MED ORDER — SODIUM CHLORIDE 0.9% FLUSH
3.0000 mL | INTRAVENOUS | Status: DC | PRN
  Administered 2019-10-29: 3 mL via INTRAVENOUS

## 2019-10-21 MED ORDER — SILVER SULFADIAZINE 1 % EX CREA
TOPICAL_CREAM | Freq: Two times a day (BID) | CUTANEOUS | Status: DC
  Administered 2019-10-22: 1 via TOPICAL
  Filled 2019-10-21 (×2): qty 85

## 2019-10-21 MED ORDER — MORPHINE SULFATE (PF) 2 MG/ML IV SOLN
2.0000 mg | INTRAVENOUS | Status: DC | PRN
  Administered 2019-10-21 – 2019-10-27 (×8): 2 mg via INTRAVENOUS
  Filled 2019-10-21 (×9): qty 1

## 2019-10-21 MED ORDER — ONDANSETRON HCL 4 MG/2ML IJ SOLN
4.0000 mg | Freq: Four times a day (QID) | INTRAMUSCULAR | Status: DC | PRN
  Administered 2019-10-22 – 2019-10-30 (×10): 4 mg via INTRAVENOUS
  Filled 2019-10-21 (×10): qty 2

## 2019-10-21 MED ORDER — PIPERACILLIN-TAZOBACTAM 3.375 G IVPB
3.3750 g | Freq: Three times a day (TID) | INTRAVENOUS | Status: DC
  Administered 2019-10-21 – 2019-10-27 (×16): 3.375 g via INTRAVENOUS
  Filled 2019-10-21 (×19): qty 50

## 2019-10-21 NOTE — Progress Notes (Signed)
Inpatient Diabetes Program Recommendations  AACE/ADA: New Consensus Statement on Inpatient Glycemic Control (2015)  Target Ranges:  Prepandial:   less than 140 mg/dL      Peak postprandial:   less than 180 mg/dL (1-2 hours)      Critically ill patients:  140 - 180 mg/dL   Lab Results  Component Value Date   GLUCAP 114 (H) 10/21/2019   HGBA1C 9.2 (H) 07/30/2019    Review of Glycemic Control Results for Mitchell [name], Mitchell [name] (MRN [id number]) as of 10/21/2019 13:56  Ref. Range 10/21/2019 10:09  Glucose-Capillary Latest Ref Range: 70 - 99 mg/dL 465 (H)   Diabetes history: DM 2 Outpatient Diabetes medications:  Basaglar 120 units daily Current orders for Inpatient glycemic control:  Novolog moderate tid with meals and HS Lantus 120 units daily  Inpatient Diabetes Program Recommendations:    Patient refused Lantus 120 units daily. Consider reducing Lantus to 60 units daily.  Will follow.   Thanks,  Beryl Meager, RN, BC-ADM Inpatient Diabetes Coordinator Pager [phone] (8a-5p)
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
## 2019-10-21 NOTE — ED Notes (Signed)
Pt. Alert and oriented x4. 
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
## 2019-10-21 NOTE — Telephone Encounter (Signed)
I spoke with Barbara Cower - [facility] and he states A. Baron Hamper had messaged they were not able to staff for this pt. I told Barbara Cower, I just wanted to make sure before continuing search for [facility].
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
## 2019-10-21 NOTE — Telephone Encounter (Signed)
Ok thanks 
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
## 2019-10-21 NOTE — Telephone Encounter (Signed)
Faxed required form, LOV and demographics to [facility].
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
## 2019-10-21 NOTE — ED Notes (Signed)
Paged [facility] per RN
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
## 2019-10-22 ENCOUNTER — Encounter (HOSPITAL_COMMUNITY): Payer: Self-pay | Admitting: Internal Medicine

## 2019-10-22 ENCOUNTER — Encounter (HOSPITAL_COMMUNITY): Payer: No Typology Code available for payment source

## 2019-10-22 ENCOUNTER — Inpatient Hospital Stay (HOSPITAL_COMMUNITY): Payer: No Typology Code available for payment source

## 2019-10-22 ENCOUNTER — Telehealth: Payer: No Typology Code available for payment source | Admitting: Podiatry

## 2019-10-22 ENCOUNTER — Ambulatory Visit: Payer: No Typology Code available for payment source | Admitting: Podiatry

## 2019-10-22 DIAGNOSIS — T25229S Burn of second degree of unspecified foot, sequela: Secondary | ICD-10-CM

## 2019-10-22 DIAGNOSIS — I5023 Acute on chronic systolic (congestive) heart failure: Secondary | ICD-10-CM

## 2019-10-22 DIAGNOSIS — Z01810 Encounter for preprocedural cardiovascular examination: Secondary | ICD-10-CM

## 2019-10-22 DIAGNOSIS — I255 Ischemic cardiomyopathy: Secondary | ICD-10-CM

## 2019-10-22 DIAGNOSIS — L0390 Cellulitis, unspecified: Secondary | ICD-10-CM

## 2019-10-22 LAB — CBC WITH DIFFERENTIAL/PLATELET
Abs Immature Granulocytes: 0.18 10*3/uL — ABNORMAL HIGH (ref 0.00–0.07)
Basophils Absolute: 0 10*3/uL (ref 0.0–0.1)
Basophils Relative: 0 %
Eosinophils Absolute: 0.2 10*3/uL (ref 0.0–0.5)
Eosinophils Relative: 1 %
HCT: 39.8 % (ref 39.0–52.0)
Hemoglobin: 12.9 g/dL — ABNORMAL LOW (ref 13.0–17.0)
Immature Granulocytes: 1 %
Lymphocytes Relative: 8 %
Lymphs Abs: 1.3 10*3/uL (ref 0.7–4.0)
MCH: 29.1 pg (ref 26.0–34.0)
MCHC: 32.4 g/dL (ref 30.0–36.0)
MCV: 89.8 fL (ref 80.0–100.0)
Monocytes Absolute: 1.3 10*3/uL — ABNORMAL HIGH (ref 0.1–1.0)
Monocytes Relative: 8 %
Neutro Abs: 13.6 10*3/uL — ABNORMAL HIGH (ref 1.7–7.7)
Neutrophils Relative %: 82 %
Platelets: 338 10*3/uL (ref 150–400)
RBC: 4.43 MIL/uL (ref 4.22–5.81)
RDW: 12.2 % (ref 11.5–15.5)
WBC: 16.6 10*3/uL — ABNORMAL HIGH (ref 4.0–10.5)
nRBC: 0 % (ref 0.0–0.2)

## 2019-10-22 LAB — BASIC METABOLIC PANEL
Anion gap: 15 (ref 5–15)
BUN: 21 mg/dL — ABNORMAL HIGH (ref 6–20)
CO2: 24 mmol/L (ref 22–32)
Calcium: 8.3 mg/dL — ABNORMAL LOW (ref 8.9–10.3)
Chloride: 102 mmol/L (ref 98–111)
Creatinine, Ser: 1.76 mg/dL — ABNORMAL HIGH (ref 0.61–1.24)
GFR calc Af Amer: 51 mL/min — ABNORMAL LOW (ref >60)
GFR calc non Af Amer: 44 mL/min — ABNORMAL LOW (ref >60)
Glucose, Bld: 155 mg/dL — ABNORMAL HIGH (ref 70–99)
Potassium: 3.7 mmol/L (ref 3.5–5.1)
Sodium: 141 mmol/L (ref 135–145)

## 2019-10-22 LAB — GLUCOSE, CAPILLARY
Glucose-Capillary: 163 mg/dL — ABNORMAL HIGH (ref 70–99)
Glucose-Capillary: 170 mg/dL — ABNORMAL HIGH (ref 70–99)
Glucose-Capillary: 150 mg/dL — ABNORMAL HIGH (ref 70–99)
Glucose-Capillary: 182 mg/dL — ABNORMAL HIGH (ref 70–99)

## 2019-10-22 LAB — MAGNESIUM: Magnesium: 1.8 mg/dL (ref 1.7–2.4)

## 2019-10-22 MED ORDER — VANCOMYCIN HCL 1500 MG/300ML IV SOLN
1500.0000 mg | INTRAVENOUS | Status: DC
  Administered 2019-10-23: 1500 mg via INTRAVENOUS
  Filled 2019-10-22 (×2): qty 300

## 2019-10-22 MED ORDER — ENSURE MAX PROTEIN PO LIQD
11.0000 [oz_av] | Freq: Two times a day (BID) | ORAL | Status: DC
  Administered 2019-10-22 – 2019-11-02 (×17): 11 [oz_av] via ORAL
  Filled 2019-10-22 (×26): qty 330

## 2019-10-22 MED ORDER — ADULT MULTIVITAMIN W/MINERALS CH
1.0000 | ORAL_TABLET | Freq: Every day | ORAL | Status: DC
  Administered 2019-10-22 – 2019-11-03 (×12): 1 via ORAL
  Filled 2019-10-22 (×13): qty 1

## 2019-10-22 MED ORDER — SENNOSIDES-DOCUSATE SODIUM 8.6-50 MG PO TABS
2.0000 | ORAL_TABLET | Freq: Every evening | ORAL | Status: DC | PRN
  Filled 2019-10-22: qty 2

## 2019-10-22 MED ORDER — OXYCODONE HCL 5 MG PO TABS
5.0000 mg | ORAL_TABLET | ORAL | Status: DC | PRN
  Administered 2019-10-25 – 2019-11-01 (×12): 5 mg via ORAL
  Filled 2019-10-22 (×13): qty 1

## 2019-10-22 MED ORDER — JUVEN PO PACK
1.0000 | PACK | Freq: Two times a day (BID) | ORAL | Status: DC
  Administered 2019-10-22 – 2019-11-02 (×23): 1 via ORAL
  Filled 2019-10-22 (×23): qty 1

## 2019-10-22 MED ORDER — INSULIN ASPART 100 UNIT/ML ~~LOC~~ SOLN
0.0000 [IU] | Freq: Three times a day (TID) | SUBCUTANEOUS | Status: DC
  Administered 2019-10-23 (×3): 3 [IU] via SUBCUTANEOUS
  Administered 2019-10-24: 5 [IU] via SUBCUTANEOUS
  Administered 2019-10-25 (×2): 3 [IU] via SUBCUTANEOUS
  Administered 2019-10-25 – 2019-10-28 (×7): 2 [IU] via SUBCUTANEOUS
  Administered 2019-10-29: 3 [IU] via SUBCUTANEOUS
  Administered 2019-10-29: 2 [IU] via SUBCUTANEOUS
  Administered 2019-10-30: 3 [IU] via SUBCUTANEOUS
  Administered 2019-10-31 – 2019-11-01 (×2): 2 [IU] via SUBCUTANEOUS
  Administered 2019-11-01: 18:00:00 3 [IU] via SUBCUTANEOUS

## 2019-10-22 MED ORDER — POTASSIUM CHLORIDE CRYS ER 20 MEQ PO TBCR
40.0000 meq | EXTENDED_RELEASE_TABLET | Freq: Once | ORAL | Status: AC
  Administered 2019-10-22: 08:00:00 40 meq via ORAL

## 2019-10-22 MED ORDER — POLYETHYLENE GLYCOL 3350 17 G PO PACK
17.0000 g | PACK | Freq: Every day | ORAL | Status: DC | PRN
  Filled 2019-10-22: qty 1
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
## 2019-10-22 NOTE — Progress Notes (Signed)
Lower extremity venous has been completed.   Preliminary results in CV Proc.   Blanch Media 10/22/2019 3:59 PM

## 2019-10-22 NOTE — Progress Notes (Addendum)
   Vital Signs MEWS/VS Documentation      10/21/2019 2230 10/22/2019 0017 10/22/2019 0150 10/22/2019 0158   MEWS Score:  0  1  2  2    MEWS Score Color:  Green  Green  Yellow  Yellow   Resp:  --  20  --  20   Pulse:  --  91  --  92   BP:  --  135/62  --  (!) 106/54   Temp:  --  (!) 101 F (38.3 C)  (!) 102.5 F (39.2 C)  (!) 102.5 F (39.2 C)   O2 Device:  --  --  --  Room Air       MEWS now yellow due to increase in TR of 102.5 (oral).  All other VSS.  APP notified.   0215-APP returned page and stated to do ice packs in groin, armpits, etc.   0240- repeat TR 100.6    10/22/2019,1:58 AM  Vital Signs MEWS/VS Documentation      10/21/2019 2223 10/21/2019 2230 10/22/2019 0017 10/22/2019 0150   MEWS Score:  --  0  1  2   MEWS Score Color:  --  Green  Green  Yellow   Resp:  --  --  20  --   Pulse:  --  --  91  --   BP:  --  --  135/62  --   Temp:  --  --  (!) 101 F (38.3 C)  (!) 102.5 F (39.2 C)   O2 Device:  Room Air  --  --  --
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
## 2019-10-22 NOTE — Progress Notes (Signed)
CCMD notified of frequent PVCs and episodes of trigeminy.  APP notified.
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
## 2019-10-23 DIAGNOSIS — I2510 Atherosclerotic heart disease of native coronary artery without angina pectoris: Secondary | ICD-10-CM

## 2019-10-23 DIAGNOSIS — I10 Essential (primary) hypertension: Secondary | ICD-10-CM

## 2019-10-23 DIAGNOSIS — I5043 Acute on chronic combined systolic (congestive) and diastolic (congestive) heart failure: Secondary | ICD-10-CM

## 2019-10-23 LAB — SURGICAL PCR SCREEN
MRSA, PCR: NEGATIVE
Staphylococcus aureus: POSITIVE — AB

## 2019-10-23 LAB — GLUCOSE, CAPILLARY
Glucose-Capillary: 153 mg/dL — ABNORMAL HIGH (ref 70–99)
Glucose-Capillary: 173 mg/dL — ABNORMAL HIGH (ref 70–99)
Glucose-Capillary: 165 mg/dL — ABNORMAL HIGH (ref 70–99)
Glucose-Capillary: 213 mg/dL — ABNORMAL HIGH (ref 70–99)

## 2019-10-23 LAB — MAGNESIUM: Magnesium: 1.8 mg/dL (ref 1.7–2.4)

## 2019-10-23 LAB — CBC
HCT: 35.5 % — ABNORMAL LOW (ref 39.0–52.0)
Hemoglobin: 11.5 g/dL — ABNORMAL LOW (ref 13.0–17.0)
MCH: 29.6 pg (ref 26.0–34.0)
MCHC: 32.4 g/dL (ref 30.0–36.0)
MCV: 91.3 fL (ref 80.0–100.0)
Platelets: 323 10*3/uL (ref 150–400)
RBC: 3.89 MIL/uL — ABNORMAL LOW (ref 4.22–5.81)
RDW: 12.4 % (ref 11.5–15.5)
WBC: 15.3 10*3/uL — ABNORMAL HIGH (ref 4.0–10.5)
nRBC: 0 % (ref 0.0–0.2)

## 2019-10-23 LAB — BASIC METABOLIC PANEL
Anion gap: 13 (ref 5–15)
BUN: 21 mg/dL — ABNORMAL HIGH (ref 6–20)
CO2: 26 mmol/L (ref 22–32)
Calcium: 8.2 mg/dL — ABNORMAL LOW (ref 8.9–10.3)
Chloride: 99 mmol/L (ref 98–111)
Creatinine, Ser: 1.71 mg/dL — ABNORMAL HIGH (ref 0.61–1.24)
GFR calc Af Amer: 53 mL/min — ABNORMAL LOW (ref >60)
GFR calc non Af Amer: 45 mL/min — ABNORMAL LOW (ref >60)
Glucose, Bld: 181 mg/dL — ABNORMAL HIGH (ref 70–99)
Potassium: 3.3 mmol/L — ABNORMAL LOW (ref 3.5–5.1)
Sodium: 138 mmol/L (ref 135–145)

## 2019-10-23 MED ORDER — DEXTROSE 5 % IV SOLN
3.0000 g | INTRAVENOUS | Status: AC
  Administered 2019-10-24: 3 g via INTRAVENOUS
  Filled 2019-10-23: qty 3

## 2019-10-23 NOTE — Anesthesia Preprocedure Evaluation (Addendum)
Anesthesia Evaluation  Patient identified by MRN, date of birth, ID band Patient awake    Airway Mallampati: II  TM Distance: >3 FB     Dental   Pulmonary    breath sounds clear to auscultation       Cardiovascular hypertension, + CAD, + Past MI and +CHF   Rhythm:Regular Rate:Normal     Neuro/Psych    GI/Hepatic negative GI ROS, Neg liver ROS,   Endo/Other  diabetes  Renal/GU negative Renal ROS     Musculoskeletal   Abdominal   Peds  Hematology   Anesthesia Other Findings   Reproductive/Obstetrics                            Anesthesia Physical Anesthesia Plan  ASA: III  Anesthesia Plan: General   Post-op Pain Management:    Induction: Intravenous  PONV Risk Score and Plan: 2 and Ondansetron, Dexamethasone and Midazolam  Airway Management Planned: LMA  Additional Equipment:   Intra-op Plan:   Post-operative Plan: Extubation in OR  Informed Consent: I have reviewed the patients History and Physical, chart, labs and discussed the procedure including the risks, benefits and alternatives for the proposed anesthesia with the patient or authorized representative who has indicated his/her understanding and acceptance.     Dental advisory given  Plan Discussed with: Anesthesiologist and CRNA  Anesthesia Plan Comments:       Anesthesia Quick Evaluation
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
## 2019-10-24 ENCOUNTER — Inpatient Hospital Stay (HOSPITAL_COMMUNITY): Payer: No Typology Code available for payment source | Admitting: Anesthesiology

## 2019-10-24 ENCOUNTER — Encounter (HOSPITAL_COMMUNITY)
Admission: EM | Disposition: A | Discharge status: Home Health | Payer: No Typology Code available for payment source | Source: Home / Self Care | Attending: Internal Medicine

## 2019-10-24 DIAGNOSIS — T25322A Burn of third degree of left foot, initial encounter: Secondary | ICD-10-CM

## 2019-10-24 DIAGNOSIS — X118XXA Contact with other hot tap-water, initial encounter: Secondary | ICD-10-CM

## 2019-10-24 DIAGNOSIS — L089 Local infection of the skin and subcutaneous tissue, unspecified: Secondary | ICD-10-CM

## 2019-10-24 DIAGNOSIS — X118XXS Contact with other hot tap-water, sequela: Secondary | ICD-10-CM

## 2019-10-24 DIAGNOSIS — T148XXA Other injury of unspecified body region, initial encounter: Secondary | ICD-10-CM

## 2019-10-24 DIAGNOSIS — T25321A Burn of third degree of right foot, initial encounter: Secondary | ICD-10-CM

## 2019-10-24 DIAGNOSIS — T25322S Burn of third degree of left foot, sequela: Secondary | ICD-10-CM

## 2019-10-24 DIAGNOSIS — T25321S Burn of third degree of right foot, sequela: Secondary | ICD-10-CM

## 2019-10-24 HISTORY — PX: I & D EXTREMITY: SHX5045

## 2019-10-24 HISTORY — PX: SKIN SPLIT GRAFT: SHX444

## 2019-10-24 LAB — CBC WITH DIFFERENTIAL/PLATELET
Abs Immature Granulocytes: 0.16 10*3/uL — ABNORMAL HIGH (ref 0.00–0.07)
Basophils Absolute: 0 10*3/uL (ref 0.0–0.1)
Basophils Relative: 0 %
Eosinophils Absolute: 0.2 10*3/uL (ref 0.0–0.5)
Eosinophils Relative: 1 %
HCT: 37.1 % — ABNORMAL LOW (ref 39.0–52.0)
Hemoglobin: 11.7 g/dL — ABNORMAL LOW (ref 13.0–17.0)
Immature Granulocytes: 1 %
Lymphocytes Relative: 8 %
Lymphs Abs: 1.1 10*3/uL (ref 0.7–4.0)
MCH: 29.3 pg (ref 26.0–34.0)
MCHC: 31.5 g/dL (ref 30.0–36.0)
MCV: 93 fL (ref 80.0–100.0)
Monocytes Absolute: 0.9 10*3/uL (ref 0.1–1.0)
Monocytes Relative: 6 %
Neutro Abs: 12.5 10*3/uL — ABNORMAL HIGH (ref 1.7–7.7)
Neutrophils Relative %: 84 %
Platelets: 338 10*3/uL (ref 150–400)
RBC: 3.99 MIL/uL — ABNORMAL LOW (ref 4.22–5.81)
RDW: 12.2 % (ref 11.5–15.5)
WBC: 14.8 10*3/uL — ABNORMAL HIGH (ref 4.0–10.5)
nRBC: 0 % (ref 0.0–0.2)

## 2019-10-24 LAB — GLUCOSE, CAPILLARY
Glucose-Capillary: 138 mg/dL — ABNORMAL HIGH (ref 70–99)
Glucose-Capillary: 139 mg/dL — ABNORMAL HIGH (ref 70–99)
Glucose-Capillary: 216 mg/dL — ABNORMAL HIGH (ref 70–99)
Glucose-Capillary: 263 mg/dL — ABNORMAL HIGH (ref 70–99)

## 2019-10-24 LAB — RENAL FUNCTION PANEL
Albumin: 2.4 g/dL — ABNORMAL LOW (ref 3.5–5.0)
Anion gap: 10 (ref 5–15)
BUN: 15 mg/dL (ref 6–20)
CO2: 27 mmol/L (ref 22–32)
Calcium: 8.4 mg/dL — ABNORMAL LOW (ref 8.9–10.3)
Chloride: 104 mmol/L (ref 98–111)
Creatinine, Ser: 1.23 mg/dL (ref 0.61–1.24)
GFR calc Af Amer: >60 mL/min (ref >60)
GFR calc non Af Amer: >60 mL/min (ref >60)
Glucose, Bld: 161 mg/dL — ABNORMAL HIGH (ref 70–99)
Phosphorus: 3.9 mg/dL (ref 2.5–4.6)
Potassium: 3.3 mmol/L — ABNORMAL LOW (ref 3.5–5.1)
Sodium: 141 mmol/L (ref 135–145)

## 2019-10-24 LAB — MAGNESIUM: Magnesium: 2 mg/dL (ref 1.7–2.4)

## 2019-10-24 SURGERY — IRRIGATION AND DEBRIDEMENT EXTREMITY
Anesthesia: General | Site: Foot | Laterality: Bilateral

## 2019-10-24 MED ORDER — PHENYLEPHRINE HCL-NACL 10-0.9 MG/250ML-% IV SOLN
INTRAVENOUS | Status: DC | PRN
  Administered 2019-10-24: 30 ug/min via INTRAVENOUS

## 2019-10-24 MED ORDER — SODIUM CHLORIDE 0.9 % IV SOLN: INTRAVENOUS | Status: DC | PRN

## 2019-10-24 MED ORDER — POTASSIUM CHLORIDE CRYS ER 20 MEQ PO TBCR: 40.0000 meq | EXTENDED_RELEASE_TABLET | Freq: Two times a day (BID) | ORAL | Status: DC

## 2019-10-24 MED ORDER — PHENYLEPHRINE 40 MCG/ML (10ML) SYRINGE FOR IV PUSH (FOR BLOOD PRESSURE SUPPORT)
PREFILLED_SYRINGE | INTRAVENOUS | Status: AC
  Filled 2019-10-24: qty 20

## 2019-10-24 MED ORDER — MIDAZOLAM HCL 5 MG/5ML IJ SOLN
INTRAMUSCULAR | Status: DC | PRN
  Administered 2019-10-24: 1 mg via INTRAVENOUS

## 2019-10-24 MED ORDER — SODIUM CHLORIDE 0.9 % IV SOLN
INTRAVENOUS | Status: DC | PRN
  Administered 2019-10-24: 500 mL

## 2019-10-24 MED ORDER — ONDANSETRON HCL 4 MG/2ML IJ SOLN
INTRAMUSCULAR | Status: AC
  Filled 2019-10-24: qty 2

## 2019-10-24 MED ORDER — SUGAMMADEX SODIUM 200 MG/2ML IV SOLN
INTRAVENOUS | Status: DC | PRN
  Administered 2019-10-24 (×2): 100 mg via INTRAVENOUS

## 2019-10-24 MED ORDER — PHENYLEPHRINE 40 MCG/ML (10ML) SYRINGE FOR IV PUSH (FOR BLOOD PRESSURE SUPPORT)
PREFILLED_SYRINGE | INTRAVENOUS | Status: DC | PRN
  Administered 2019-10-24: 80 ug via INTRAVENOUS
  Administered 2019-10-24: 120 ug via INTRAVENOUS
  Administered 2019-10-24 (×3): 80 ug via INTRAVENOUS

## 2019-10-24 MED ORDER — ALBUMIN HUMAN 5 % IV SOLN: INTRAVENOUS | Status: DC | PRN

## 2019-10-24 MED ORDER — BUPIVACAINE HCL 0.25 % IJ SOLN
INTRAMUSCULAR | Status: DC | PRN
  Administered 2019-10-24: 30 mL

## 2019-10-24 MED ORDER — FENTANYL CITRATE (PF) 100 MCG/2ML IJ SOLN
INTRAMUSCULAR | Status: AC
  Filled 2019-10-24: qty 2

## 2019-10-24 MED ORDER — MINERAL OIL LIGHT EX OIL
TOPICAL_OIL | CUTANEOUS | Status: DC | PRN
  Administered 2019-10-24: 20 mL

## 2019-10-24 MED ORDER — POTASSIUM CHLORIDE CRYS ER 20 MEQ PO TBCR
40.0000 meq | EXTENDED_RELEASE_TABLET | Freq: Every day | ORAL | Status: DC
  Administered 2019-10-24 – 2019-10-30 (×7): 40 meq via ORAL
  Filled 2019-10-24 (×7): qty 2

## 2019-10-24 MED ORDER — CEFAZOLIN SODIUM 1 G IJ SOLR
INTRAMUSCULAR | Status: AC
  Filled 2019-10-24: qty 30

## 2019-10-24 MED ORDER — PROPOFOL 10 MG/ML IV BOLUS
INTRAVENOUS | Status: AC
  Filled 2019-10-24: qty 40

## 2019-10-24 MED ORDER — EPINEPHRINE PF 1 MG/ML IJ SOLN
INTRAMUSCULAR | Status: DC | PRN
  Administered 2019-10-24: 1 mg

## 2019-10-24 MED ORDER — SODIUM CHLORIDE 0.9 % IR SOLN
Status: DC | PRN
  Administered 2019-10-24: 1000 mL

## 2019-10-24 MED ORDER — FENTANYL CITRATE (PF) 250 MCG/5ML IJ SOLN
INTRAMUSCULAR | Status: DC | PRN
  Administered 2019-10-24: 50 ug via INTRAVENOUS
  Administered 2019-10-24: 100 ug via INTRAVENOUS

## 2019-10-24 MED ORDER — VANCOMYCIN HCL 1250 MG/250ML IV SOLN
1250.0000 mg | Freq: Two times a day (BID) | INTRAVENOUS | Status: DC
  Administered 2019-10-24 – 2019-10-26 (×4): 1250 mg via INTRAVENOUS
  Filled 2019-10-24 (×5): qty 250

## 2019-10-24 MED ORDER — EPINEPHRINE PF 1 MG/ML IJ SOLN
INTRAMUSCULAR | Status: AC
  Filled 2019-10-24: qty 1

## 2019-10-24 MED ORDER — ROCURONIUM BROMIDE 10 MG/ML (PF) SYRINGE
PREFILLED_SYRINGE | INTRAVENOUS | Status: DC | PRN
  Administered 2019-10-24: 20 mg via INTRAVENOUS
  Administered 2019-10-24: 30 mg via INTRAVENOUS

## 2019-10-24 MED ORDER — BUPIVACAINE HCL (PF) 0.25 % IJ SOLN
INTRAMUSCULAR | Status: AC
  Filled 2019-10-24: qty 30

## 2019-10-24 MED ORDER — ONDANSETRON HCL 4 MG/2ML IJ SOLN
INTRAMUSCULAR | Status: DC | PRN
  Administered 2019-10-24: 4 mg via INTRAVENOUS

## 2019-10-24 MED ORDER — FENTANYL CITRATE (PF) 100 MCG/2ML IJ SOLN: 25.0000 ug | INTRAMUSCULAR | Status: DC | PRN

## 2019-10-24 MED ORDER — LIDOCAINE 2% (20 MG/ML) 5 ML SYRINGE
INTRAMUSCULAR | Status: DC | PRN
  Administered 2019-10-24: 100 mg via INTRAVENOUS

## 2019-10-24 MED ORDER — MIDAZOLAM HCL 2 MG/2ML IJ SOLN
INTRAMUSCULAR | Status: AC
  Filled 2019-10-24: qty 2

## 2019-10-24 MED ORDER — FENTANYL CITRATE (PF) 250 MCG/5ML IJ SOLN
INTRAMUSCULAR | Status: AC
  Filled 2019-10-24: qty 5

## 2019-10-24 MED ORDER — SUCCINYLCHOLINE CHLORIDE 200 MG/10ML IV SOSY
PREFILLED_SYRINGE | INTRAVENOUS | Status: DC | PRN
  Administered 2019-10-24: 140 mg via INTRAVENOUS

## 2019-10-24 MED ORDER — ASPIRIN EC 81 MG PO TBEC
81.0000 mg | DELAYED_RELEASE_TABLET | Freq: Every day | ORAL | Status: DC
  Administered 2019-10-24 – 2019-11-03 (×11): 81 mg via ORAL
  Filled 2019-10-24 (×11): qty 1

## 2019-10-24 MED ORDER — PROPOFOL 10 MG/ML IV BOLUS
INTRAVENOUS | Status: DC | PRN
  Administered 2019-10-24: 150 mg via INTRAVENOUS

## 2019-10-24 MED ORDER — 0.9 % SODIUM CHLORIDE (POUR BTL) OPTIME
TOPICAL | Status: DC | PRN
  Administered 2019-10-24: 1000 mL

## 2019-10-24 MED ORDER — SODIUM CHLORIDE 0.9 % IV SOLN
INTRAVENOUS | Status: AC
  Filled 2019-10-24: qty 500000

## 2019-10-24 MED ORDER — FENTANYL CITRATE (PF) 100 MCG/2ML IJ SOLN
25.0000 ug | INTRAMUSCULAR | Status: DC | PRN
  Administered 2019-10-24: 25 ug via INTRAVENOUS

## 2019-10-24 SURGICAL SUPPLY — 64 items
BAG DECANTER FOR FLEXI CONT (MISCELLANEOUS) ×6 IMPLANT
BLADE DERMATOME SS (BLADE) ×5 IMPLANT
BNDG CONFORM 2 STRL LF (GAUZE/BANDAGES/DRESSINGS) IMPLANT
BNDG ELASTIC 4X5.8 VLCR STR LF (GAUZE/BANDAGES/DRESSINGS) ×5 IMPLANT
BNDG GAUZE ELAST 4 BULKY (GAUZE/BANDAGES/DRESSINGS) IMPLANT
BNDG STRETCH 4X75 NS LF (GAUZE/BANDAGES/DRESSINGS) ×8 IMPLANT
CORD BIPOLAR FORCEPS 12FT (ELECTRODE) IMPLANT
COVER SURGICAL LIGHT HANDLE (MISCELLANEOUS) ×3 IMPLANT
COVER WAND RF STERILE (DRAPES) ×3 IMPLANT
DERMACARRIERS GRAFT 1 TO 1.5 (DISPOSABLE) ×6
DRAPE EXTREMITY BILATERAL (DRAPES) ×2 IMPLANT
DRAPE HALF SHEET 40X57 (DRAPES) ×5 IMPLANT
DRAPE INCISE IOBAN 66X45 STRL (DRAPES) ×2 IMPLANT
DRAPE ORTHO SPLIT 77X108 STRL (DRAPES) ×9
DRAPE SURG ORHT 6 SPLT 77X108 (DRAPES) ×2 IMPLANT
DRSG CALCIUM ALGINATE 4X4 (GAUZE/BANDAGES/DRESSINGS) ×10 IMPLANT
DRSG MEPITEL 4X7.2 (GAUZE/BANDAGES/DRESSINGS) ×6 IMPLANT
DRSG OPSITE 6X11 MED (GAUZE/BANDAGES/DRESSINGS) IMPLANT
DRSG PAD ABDOMINAL 8X10 ST (GAUZE/BANDAGES/DRESSINGS) IMPLANT
ELECT REM PT RETURN 9FT ADLT (ELECTROSURGICAL) ×3
ELECTRODE REM PT RTRN 9FT ADLT (ELECTROSURGICAL) IMPLANT
FILTER STRAW FLUID ASPIR (MISCELLANEOUS) ×3 IMPLANT
GAUZE SPONGE 4X4 12PLY STRL (GAUZE/BANDAGES/DRESSINGS) ×3 IMPLANT
GAUZE SPONGE 4X4 12PLY STRL LF (GAUZE/BANDAGES/DRESSINGS) ×2 IMPLANT
GAUZE XEROFORM 1X8 LF (GAUZE/BANDAGES/DRESSINGS) ×1 IMPLANT
GAUZE XEROFORM 5X9 LF (GAUZE/BANDAGES/DRESSINGS) ×6 IMPLANT
GLOVE BIO SURGEON STRL SZ7 (GLOVE) ×2 IMPLANT
GLOVE BIO SURGEON STRL SZ8 (GLOVE) ×3 IMPLANT
GLOVE INDICATOR 8.0 STRL GRN (GLOVE) ×6 IMPLANT
GOWN STRL REUS W/ TWL LRG LVL3 (GOWN DISPOSABLE) ×2 IMPLANT
GOWN STRL REUS W/TWL LRG LVL3 (GOWN DISPOSABLE) ×6
GRAFT DERMACARRIERS 1 TO 1.5 (DISPOSABLE) ×1 IMPLANT
HANDPIECE INTERPULSE COAX TIP (DISPOSABLE) ×3
IV NS 1000ML (IV SOLUTION) ×3
IV NS 1000ML BAXH (IV SOLUTION) ×1 IMPLANT
KIT BASIN OR (CUSTOM PROCEDURE TRAY) ×3 IMPLANT
KIT TURNOVER KIT B (KITS) ×3 IMPLANT
MANIFOLD NEPTUNE II (INSTRUMENTS) ×3 IMPLANT
NDL FILTER BLUNT 18X1 1/2 (NEEDLE) IMPLANT
NDL HYPO 25GX1X1/2 BEV (NEEDLE) IMPLANT
NDL SPNL 18GX3.5 QUINCKE PK (NEEDLE) ×1 IMPLANT
NEEDLE FILTER BLUNT 18X 1/2SAF (NEEDLE) ×2
NEEDLE FILTER BLUNT 18X1 1/2 (NEEDLE) ×1 IMPLANT
NEEDLE HYPO 25GX1X1/2 BEV (NEEDLE) ×3 IMPLANT
NEEDLE SPNL 18GX3.5 QUINCKE PK (NEEDLE) ×3 IMPLANT
NS IRRIG 1000ML POUR BTL (IV SOLUTION) ×3 IMPLANT
PACK GENERAL/GYN (CUSTOM PROCEDURE TRAY) ×3 IMPLANT
PACK ORTHO EXTREMITY (CUSTOM PROCEDURE TRAY) ×3 IMPLANT
PAD ABD 8X10 STRL (GAUZE/BANDAGES/DRESSINGS) IMPLANT
PAD ARMBOARD 7.5X6 YLW CONV (MISCELLANEOUS) ×6 IMPLANT
SET CYSTO W/LG BORE CLAMP LF (SET/KITS/TRAYS/PACK) IMPLANT
SET HNDPC FAN SPRY TIP SCT (DISPOSABLE) IMPLANT
SOL PREP POV-IOD 4OZ 10% (MISCELLANEOUS) ×6 IMPLANT
SUT CHROMIC 4 0 PS 2 18 (SUTURE) ×8 IMPLANT
SUT VIC AB 4-0 PS2 18 (SUTURE) ×6 IMPLANT
SYR 50ML LL SCALE MARK (SYRINGE) ×3 IMPLANT
SYR CONTROL 10ML LL (SYRINGE) ×3 IMPLANT
TOWEL GREEN STERILE (TOWEL DISPOSABLE) ×3 IMPLANT
TOWEL GREEN STERILE FF (TOWEL DISPOSABLE) ×3 IMPLANT
TUBE CONNECTING 12'X1/4 (SUCTIONS) ×1
TUBE CONNECTING 12X1/4 (SUCTIONS) ×2 IMPLANT
UNDERPAD 30X30 (UNDERPADS AND DIAPERS) ×3 IMPLANT
WATER STERILE IRR 1000ML POUR (IV SOLUTION) ×3 IMPLANT
YANKAUER SUCT BULB TIP NO VENT (SUCTIONS) ×3 IMPLANT
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
## 2019-10-24 NOTE — Anesthesia Procedure Notes (Signed)
Procedure Name: Intubation Date/Time: 10/24/2019 7:59 AM Performed by: Trinna Post., CRNA Pre-anesthesia Checklist: Patient identified, Emergency Drugs available, Suction available, Patient being monitored and Timeout performed Patient Re-evaluated:Patient Re-evaluated prior to induction Oxygen Delivery Method: Circle system utilized Preoxygenation: Pre-oxygenation with 100% oxygen Induction Type: IV induction Ventilation: Mask ventilation without difficulty and Oral airway inserted - appropriate to patient size Laryngoscope Size: Mac and 4 Grade View: Grade I Tube type: Oral Tube size: 7.5 mm Number of attempts: 1 Airway Equipment and Method: Stylet Placement Confirmation: ETT inserted through vocal cords under direct vision,  positive ETCO2 and breath sounds checked- equal and bilateral Secured at: 22 cm Tube secured with: Tape Dental Injury: Teeth and Oropharynx as per pre-operative assessment
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
## 2019-10-25 DIAGNOSIS — N179 Acute kidney failure, unspecified: Secondary | ICD-10-CM

## 2019-10-25 DIAGNOSIS — E785 Hyperlipidemia, unspecified: Secondary | ICD-10-CM

## 2019-10-25 LAB — RENAL FUNCTION PANEL
Albumin: 2.3 g/dL — ABNORMAL LOW (ref 3.5–5.0)
Anion gap: 9 (ref 5–15)
BUN: 14 mg/dL (ref 6–20)
CO2: 28 mmol/L (ref 22–32)
Calcium: 8.3 mg/dL — ABNORMAL LOW (ref 8.9–10.3)
Chloride: 104 mmol/L (ref 98–111)
Creatinine, Ser: 1.39 mg/dL — ABNORMAL HIGH (ref 0.61–1.24)
GFR calc Af Amer: >60 mL/min (ref >60)
GFR calc non Af Amer: 58 mL/min — ABNORMAL LOW (ref >60)
Glucose, Bld: 186 mg/dL — ABNORMAL HIGH (ref 70–99)
Phosphorus: 3.1 mg/dL (ref 2.5–4.6)
Potassium: 4.1 mmol/L (ref 3.5–5.1)
Sodium: 141 mmol/L (ref 135–145)

## 2019-10-25 LAB — CBC WITH DIFFERENTIAL/PLATELET
Abs Immature Granulocytes: 0.08 10*3/uL — ABNORMAL HIGH (ref 0.00–0.07)
Basophils Absolute: 0 10*3/uL (ref 0.0–0.1)
Basophils Relative: 0 %
Eosinophils Absolute: 0.2 10*3/uL (ref 0.0–0.5)
Eosinophils Relative: 1 %
HCT: 35.7 % — ABNORMAL LOW (ref 39.0–52.0)
Hemoglobin: 11.3 g/dL — ABNORMAL LOW (ref 13.0–17.0)
Immature Granulocytes: 1 %
Lymphocytes Relative: 10 %
Lymphs Abs: 1.1 10*3/uL (ref 0.7–4.0)
MCH: 29.5 pg (ref 26.0–34.0)
MCHC: 31.7 g/dL (ref 30.0–36.0)
MCV: 93.2 fL (ref 80.0–100.0)
Monocytes Absolute: 0.9 10*3/uL (ref 0.1–1.0)
Monocytes Relative: 7 %
Neutro Abs: 9.7 10*3/uL — ABNORMAL HIGH (ref 1.7–7.7)
Neutrophils Relative %: 81 %
Platelets: 346 10*3/uL (ref 150–400)
RBC: 3.83 MIL/uL — ABNORMAL LOW (ref 4.22–5.81)
RDW: 12.3 % (ref 11.5–15.5)
WBC: 12 10*3/uL — ABNORMAL HIGH (ref 4.0–10.5)
nRBC: 0 % (ref 0.0–0.2)

## 2019-10-25 LAB — GLUCOSE, CAPILLARY
Glucose-Capillary: 172 mg/dL — ABNORMAL HIGH (ref 70–99)
Glucose-Capillary: 184 mg/dL — ABNORMAL HIGH (ref 70–99)
Glucose-Capillary: 172 mg/dL — ABNORMAL HIGH (ref 70–99)
Glucose-Capillary: 141 mg/dL — ABNORMAL HIGH (ref 70–99)
Glucose-Capillary: 165 mg/dL — ABNORMAL HIGH (ref 70–99)

## 2019-10-25 LAB — VANCOMYCIN, PEAK: Vancomycin Pk: 30 ug/mL (ref 30–40)

## 2019-10-25 LAB — MAGNESIUM: Magnesium: 2 mg/dL (ref 1.7–2.4)
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
## 2019-10-26 LAB — BASIC METABOLIC PANEL
Anion gap: 7 (ref 5–15)
BUN: 11 mg/dL (ref 6–20)
CO2: 28 mmol/L (ref 22–32)
Calcium: 8.3 mg/dL — ABNORMAL LOW (ref 8.9–10.3)
Chloride: 105 mmol/L (ref 98–111)
Creatinine, Ser: 1.35 mg/dL — ABNORMAL HIGH (ref 0.61–1.24)
GFR calc Af Amer: >60 mL/min (ref >60)
GFR calc non Af Amer: >60 mL/min (ref >60)
Glucose, Bld: 123 mg/dL — ABNORMAL HIGH (ref 70–99)
Potassium: 4.1 mmol/L (ref 3.5–5.1)
Sodium: 140 mmol/L (ref 135–145)

## 2019-10-26 LAB — CBC WITH DIFFERENTIAL/PLATELET
Abs Immature Granulocytes: 0.07 10*3/uL (ref 0.00–0.07)
Basophils Absolute: 0 10*3/uL (ref 0.0–0.1)
Basophils Relative: 0 %
Eosinophils Absolute: 0.2 10*3/uL (ref 0.0–0.5)
Eosinophils Relative: 1 %
HCT: 33.5 % — ABNORMAL LOW (ref 39.0–52.0)
Hemoglobin: 10.6 g/dL — ABNORMAL LOW (ref 13.0–17.0)
Immature Granulocytes: 1 %
Lymphocytes Relative: 11 %
Lymphs Abs: 1.3 10*3/uL (ref 0.7–4.0)
MCH: 29 pg (ref 26.0–34.0)
MCHC: 31.6 g/dL (ref 30.0–36.0)
MCV: 91.8 fL (ref 80.0–100.0)
Monocytes Absolute: 0.9 10*3/uL (ref 0.1–1.0)
Monocytes Relative: 8 %
Neutro Abs: 9 10*3/uL — ABNORMAL HIGH (ref 1.7–7.7)
Neutrophils Relative %: 79 %
Platelets: 332 10*3/uL (ref 150–400)
RBC: 3.65 MIL/uL — ABNORMAL LOW (ref 4.22–5.81)
RDW: 12.3 % (ref 11.5–15.5)
WBC: 11.5 10*3/uL — ABNORMAL HIGH (ref 4.0–10.5)
nRBC: 0 % (ref 0.0–0.2)

## 2019-10-26 LAB — GLUCOSE, CAPILLARY
Glucose-Capillary: 106 mg/dL — ABNORMAL HIGH (ref 70–99)
Glucose-Capillary: 123 mg/dL — ABNORMAL HIGH (ref 70–99)
Glucose-Capillary: 134 mg/dL — ABNORMAL HIGH (ref 70–99)
Glucose-Capillary: 124 mg/dL — ABNORMAL HIGH (ref 70–99)

## 2019-10-26 LAB — CULTURE, BLOOD (ROUTINE X 2)
Culture: NO GROWTH
Culture: NO GROWTH
Special Requests: ADEQUATE

## 2019-10-26 LAB — VANCOMYCIN, TROUGH: Vancomycin Tr: 19 ug/mL (ref 15–20)

## 2019-10-26 LAB — BRAIN NATRIURETIC PEPTIDE: B Natriuretic Peptide: 288.9 pg/mL — ABNORMAL HIGH (ref 0.0–100.0)

## 2019-10-26 MED ORDER — VANCOMYCIN HCL IN DEXTROSE 1-5 GM/200ML-% IV SOLN
1000.0000 mg | Freq: Two times a day (BID) | INTRAVENOUS | Status: DC
  Administered 2019-10-26 – 2019-10-27 (×2): 1000 mg via INTRAVENOUS
  Filled 2019-10-26 (×2): qty 200
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
## 2019-10-26 NOTE — Progress Notes (Signed)
Progress Note  Patient Name: Mitchell [name] Date of Encounter: 10/26/2019  Primary Cardiologist: Glori Bickers, MD   Subjective  Reports dyspnea is stable.  BP 114/44 this morning.  Stable renal function (1.39 to 1.35).  Lasix and Entresto have been held.   Inpatient Medications    Scheduled Meds: . aspirin EC  81 mg Oral Daily  . atorvastatin  80 mg Oral QHS  . carvedilol  9.375 mg Oral BID WC  . heparin  5,000 Units Subcutaneous Q8H  . insulin aspart  0-15 Units Subcutaneous TID WC  . insulin aspart  0-5 Units Subcutaneous QHS  . insulin glargine  60 Units Subcutaneous Daily  . multivitamin with minerals  1 tablet Oral Daily  . nutrition supplement (JUVEN)  1 packet Oral BID BM  . potassium chloride  40 mEq Oral Daily  . Ensure Max Protein  11 oz Oral BID  . silver sulfADIAZINE   Topical BID  . sodium chloride flush  3 mL Intravenous Q12H   Continuous Infusions: . sodium chloride 250 mL (10/25/19 2241)  . piperacillin-tazobactam (ZOSYN)  IV 3.375 g (10/26/19 0627)  . vancomycin 1,250 mg (10/26/19 0425)   PRN Meds: sodium chloride, acetaminophen, alum & mag hydroxide-simeth, morphine injection, ondansetron (ZOFRAN) IV, oxyCODONE, oxyCODONE-acetaminophen, polyethylene glycol, senna-docusate, sodium chloride flush   Vital Signs    Vitals:   10/25/19 0837 10/25/19 2015 10/26/19 0611 10/26/19 0935  BP: (!) 135/117 (!) 128/56  (!) 114/44  Pulse:  76    Resp: 20 20    Temp: 98 F (36.7 C) 98 F (36.7 C)  98.1 F (36.7 C)  TempSrc: Oral Oral  Oral  SpO2: 95% 100%  99%  Weight:   132.9 kg   Height:        Intake/Output Summary (Last 24 hours) at 10/26/2019 1121 Last data filed at 10/26/2019 0900 Gross per 24 hour  Intake 1274.92 ml  Output 200 ml  Net 1074.92 ml   Last 3 Weights 10/26/2019 10/25/2019 10/24/2019  Weight (lbs) 292 lb 15.9 oz 299 lb 6.2 oz 296 lb 1.2 oz  Weight (kg) 132.9 kg 135.8 kg 134.3 kg      Telemetry    Sinus rhythm 80-90s with PVCs -  Personally Reviewed  ECG    No new tracings - Personally Reviewed  Physical Exam   GEN: obese male in no acute distress, resting with HOB elevated and on his side, comfortable Neck: No JVD - exam difficult Cardiac: RRR, no murmurs, rubs, or gallops.  Respiratory: Clear to auscultation bilaterally. GI: Soft, nontender, non-distended  MS: trace edema; both feet wrapped  Neuro:  Nonfocal  Psych: Normal affect   Labs    High Sensitivity Troponin:   Recent Labs  Lab 10/20/19 2015  TROPONINIHS 17      Chemistry Recent Labs  Lab 10/24/19 1207 10/25/19 0602 10/26/19 0327  NA 141 141 140  K 3.3* 4.1 4.1  CL 104 104 105  CO2 27 28 28   GLUCOSE 161* 186* 123*  BUN 15 14 11   CREATININE 1.23 1.39* 1.35*  CALCIUM 8.4* 8.3* 8.3*  ALBUMIN 2.4* 2.3*  --   GFRNONAA >60 58* >60  GFRAA >60 >60 >60  ANIONGAP 10 9 7      Hematology Recent Labs  Lab 10/24/19 1207 10/25/19 0602 10/26/19 0327  WBC 14.8* 12.0* 11.5*  RBC 3.99* 3.83* 3.65*  HGB 11.7* 11.3* 10.6*  HCT 37.1* 35.7* 33.5*  MCV 93.0 93.2 91.8  MCH 29.3 29.5  29.0  MCHC 31.5 31.7 31.6  RDW 12.2 12.3 12.3  PLT 338 346 332    BNP Recent Labs  Lab 10/20/19 2014 10/26/19 0327  BNP 274.3* 288.9*     DDimer No results for input(s): DDIMER in the last 168 hours.   Radiology    No results found.  Cardiac Studies   Echo 07/30/19: 1. Left ventricular ejection fraction, by visual estimation, is 25 to  30%. The left ventricle has severely decreased function. There is mildly  increased left ventricular hypertrophy.  2. Definity contrast agent was given IV to delineate the left ventricular  endocardial borders.  3. Indeterminate diastolic filling due to E-A fusion.  4. Mildly dilated left ventricular internal cavity size.  5. The left ventricle demonstrates global hypokinesis.  6. No LV thrombus on contrast imaging.  7. Global right ventricle has low normal systolic function.The right  ventricular size  is mildly enlarged. No increase in right ventricular wall  thickness.  8. Left atrial size was normal.  9. Right atrial size was normal.  10. Trivial pericardial effusion is present.  11. The mitral valve is degenerative. Mild mitral valve regurgitation.  12. The tricuspid valve is grossly normal.  13. The aortic valve is tricuspid. Aortic valve regurgitation is not  visualized. No evidence of aortic valve sclerosis or stenosis.  14. The pulmonic valve was grossly normal. Pulmonic valve regurgitation is  not visualized.  15. Moderately elevated pulmonary artery systolic pressure.  16. The tricuspid regurgitant velocity is 3.52 m/s, and with an assumed  right atrial pressure of 15 mmHg, the estimated right ventricular systolic  pressure is moderately elevated at 64.6 mmHg.  17. The inferior vena cava is dilated in size with <50% respiratory  variability, suggesting right atrial pressure of 15 mmHg.  18. A prior study was performed on 08/08/2018.  19. EF remains severely decreased 25-30%.  20. No significant change from prior study.   Patient Profile     49 y.o. male with a hx of chronic systolic CHF secondary to mixed ICM/NICM, diffuse 3V CAD with diabetic vasculopathy on cath 2019, has refused ICD, HTN, HLD, DM, CVA (in [place] 2019), bipolar disorder/schizophrenia, prior OSA (sleep study 2018 without significant OSA), obesity who is being seen today for the evaluation of pre-op clearance & CHF.  Assessment & Plan    1. Acute on chronic systolic heart failure 2. Moderate pulmonary hypertension - EF 25-30% - has received lasix 40 mg IV x 3 doses.  Has been held since bump in creatinine -Continue carvedilol -Holding Entresto given renal function -Renal function improving with holding diuresis and weight is down.  Continue to monitor daily weights and BMET, can hold diuresis for now.    3. Multivessel CAD - not amenable to intervention - Continue ASA - stress test or other workup  deferred as he is not a candidate or intervention - no chest pain - continue statin   4. HTN - continue coreg 9.375 mg BID - will hold further IV diuresis    5. AKI - sCr 1.35 (1.39)   6. Hyperlipidemia - continue statin - recheck lipid profile in 6 weeks if compliant     For questions or updates, please contact [facility] Please consult [email or web address] for contact info under        Signed, Little Ishikawa, MD  10/26/2019, 11:21 AM

## 2019-10-27 DIAGNOSIS — G4733 Obstructive sleep apnea (adult) (pediatric): Secondary | ICD-10-CM

## 2019-10-27 DIAGNOSIS — X118XXA Contact with other hot tap-water, initial encounter: Secondary | ICD-10-CM

## 2019-10-27 DIAGNOSIS — T25322A Burn of third degree of left foot, initial encounter: Principal | ICD-10-CM

## 2019-10-27 DIAGNOSIS — Z79899 Other long term (current) drug therapy: Secondary | ICD-10-CM

## 2019-10-27 DIAGNOSIS — I110 Hypertensive heart disease with heart failure: Secondary | ICD-10-CM

## 2019-10-27 DIAGNOSIS — T25321A Burn of third degree of right foot, initial encounter: Secondary | ICD-10-CM

## 2019-10-27 DIAGNOSIS — E119 Type 2 diabetes mellitus without complications: Secondary | ICD-10-CM

## 2019-10-27 DIAGNOSIS — Z794 Long term (current) use of insulin: Secondary | ICD-10-CM

## 2019-10-27 DIAGNOSIS — I5021 Acute systolic (congestive) heart failure: Secondary | ICD-10-CM

## 2019-10-27 DIAGNOSIS — Z8673 Personal history of transient ischemic attack (TIA), and cerebral infarction without residual deficits: Secondary | ICD-10-CM

## 2019-10-27 DIAGNOSIS — Z7982 Long term (current) use of aspirin: Secondary | ICD-10-CM

## 2019-10-27 DIAGNOSIS — F209 Schizophrenia, unspecified: Secondary | ICD-10-CM

## 2019-10-27 LAB — CBC WITH DIFFERENTIAL/PLATELET
Abs Immature Granulocytes: 0.07 10*3/uL (ref 0.00–0.07)
Basophils Absolute: 0 10*3/uL (ref 0.0–0.1)
Basophils Relative: 0 %
Eosinophils Absolute: 0.1 10*3/uL (ref 0.0–0.5)
Eosinophils Relative: 1 %
HCT: 33.2 % — ABNORMAL LOW (ref 39.0–52.0)
Hemoglobin: 10.4 g/dL — ABNORMAL LOW (ref 13.0–17.0)
Immature Granulocytes: 1 %
Lymphocytes Relative: 11 %
Lymphs Abs: 1.2 10*3/uL (ref 0.7–4.0)
MCH: 29.1 pg (ref 26.0–34.0)
MCHC: 31.3 g/dL (ref 30.0–36.0)
MCV: 93 fL (ref 80.0–100.0)
Monocytes Absolute: 0.8 10*3/uL (ref 0.1–1.0)
Monocytes Relative: 7 %
Neutro Abs: 8.9 10*3/uL — ABNORMAL HIGH (ref 1.7–7.7)
Neutrophils Relative %: 80 %
Platelets: 357 10*3/uL (ref 150–400)
RBC: 3.57 MIL/uL — ABNORMAL LOW (ref 4.22–5.81)
RDW: 12.3 % (ref 11.5–15.5)
WBC: 11.1 10*3/uL — ABNORMAL HIGH (ref 4.0–10.5)
nRBC: 0 % (ref 0.0–0.2)

## 2019-10-27 LAB — GLUCOSE, CAPILLARY
Glucose-Capillary: 131 mg/dL — ABNORMAL HIGH (ref 70–99)
Glucose-Capillary: 126 mg/dL — ABNORMAL HIGH (ref 70–99)
Glucose-Capillary: 113 mg/dL — ABNORMAL HIGH (ref 70–99)
Glucose-Capillary: 142 mg/dL — ABNORMAL HIGH (ref 70–99)

## 2019-10-27 LAB — BASIC METABOLIC PANEL
Anion gap: 8 (ref 5–15)
BUN: 14 mg/dL (ref 6–20)
CO2: 27 mmol/L (ref 22–32)
Calcium: 8.5 mg/dL — ABNORMAL LOW (ref 8.9–10.3)
Chloride: 107 mmol/L (ref 98–111)
Creatinine, Ser: 1.2 mg/dL (ref 0.61–1.24)
GFR calc Af Amer: >60 mL/min (ref >60)
GFR calc non Af Amer: >60 mL/min (ref >60)
Glucose, Bld: 114 mg/dL — ABNORMAL HIGH (ref 70–99)
Potassium: 4.5 mmol/L (ref 3.5–5.1)
Sodium: 142 mmol/L (ref 135–145)

## 2019-10-27 MED ORDER — SACUBITRIL-VALSARTAN 49-51 MG PO TABS
1.0000 | ORAL_TABLET | Freq: Two times a day (BID) | ORAL | Status: DC
  Administered 2019-10-27 – 2019-10-29 (×5): 1 via ORAL
  Filled 2019-10-27 (×5): qty 1

## 2019-10-27 MED ORDER — SODIUM CHLORIDE 0.9 % IV SOLN
2.0000 g | INTRAVENOUS | Status: AC
  Administered 2019-10-27 – 2019-10-30 (×4): 2 g via INTRAVENOUS
  Filled 2019-10-27: qty 20
  Filled 2019-10-27: qty 2
  Filled 2019-10-27 (×2): qty 20
  Filled 2019-10-27: qty 2
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
## 2019-10-27 NOTE — Progress Notes (Signed)
Progress Note  Patient Name: Mitchell [name] Date of Encounter: 10/27/2019  Primary Cardiologist: Glori Bickers, MD    Subjective   49 year old gentleman with a history of coronary artery disease and congestive heart failure.  He has diffuse diabetic three-vessel coronary artery disease by heart catheterization.  His stenosis are not amenable to revascularization.  He is refused ICD.  He has a history of hypertension, hyperlipidemia, diabetes mellitus, CVA.  He has history of borderline disorder/schizophrenia and has had some obstructive sleep sleep apnea in the past.  The past several days his creatinine has gradually improved. Creatinine is about back to baseline    Inpatient Medications    Scheduled Meds: . aspirin EC  81 mg Oral Daily  . atorvastatin  80 mg Oral QHS  . carvedilol  9.375 mg Oral BID WC  . heparin  5,000 Units Subcutaneous Q8H  . insulin aspart  0-15 Units Subcutaneous TID WC  . insulin aspart  0-5 Units Subcutaneous QHS  . insulin glargine  60 Units Subcutaneous Daily  . multivitamin with minerals  1 tablet Oral Daily  . nutrition supplement (JUVEN)  1 packet Oral BID BM  . potassium chloride  40 mEq Oral Daily  . Ensure Max Protein  11 oz Oral BID  . silver sulfADIAZINE   Topical BID  . sodium chloride flush  3 mL Intravenous Q12H   Continuous Infusions: . sodium chloride 250 mL (10/27/19 0606)  . piperacillin-tazobactam (ZOSYN)  IV 3.375 g (10/27/19 5809)  . vancomycin 1,000 mg (10/27/19 0449)   PRN Meds: sodium chloride, acetaminophen, alum & mag hydroxide-simeth, morphine injection, ondansetron (ZOFRAN) IV, oxyCODONE, oxyCODONE-acetaminophen, polyethylene glycol, senna-docusate, sodium chloride flush   Vital Signs    Vitals:   10/26/19 2015 10/26/19 2118 10/27/19 0615 10/27/19 0805  BP: 116/63 128/69 130/87 131/77  Pulse: 78 80 82 77  Resp: 16 18 17 16   Temp: 98.3 F (36.8 C)  98.1 F (36.7 C)   TempSrc: Oral  Oral   SpO2: 96%  95% 98%    Weight:   (!) 137.2 kg   Height:        Intake/Output Summary (Last 24 hours) at 10/27/2019 0958 Last data filed at 10/27/2019 0900 Gross per 24 hour  Intake 1359.86 ml  Output 425 ml  Net 934.86 ml   Last 3 Weights 10/27/2019 10/26/2019 10/25/2019  Weight (lbs) 302 lb 7.5 oz 292 lb 15.9 oz 299 lb 6.2 oz  Weight (kg) 137.2 kg 132.9 kg 135.8 kg      Telemetry    NSR - Personally Reviewed  ECG     - Personally Reviewed  Physical Exam   GEN:  morbidly obese male,  Lying in bed.  NAD .   Neck: No JVD Cardiac: RRR,    Respiratory: Clear to auscultation bilaterally. GI: Soft, nontender, non-distended  MS:  legs are bandaged.  Neuro:  Nonfocal  Psych: Normal affect   Labs    High Sensitivity Troponin:   Recent Labs  Lab 10/20/19 2015  TROPONINIHS 17      Chemistry Recent Labs  Lab 10/24/19 1207 10/24/19 1207 10/25/19 0602 10/26/19 0327 10/27/19 0459  NA 141   < > 141 140 142  K 3.3*   < > 4.1 4.1 4.5  CL 104   < > 104 105 107  CO2 27   < > 28 28 27   GLUCOSE 161*   < > 186* 123* 114*  BUN 15   < > 14  11 14  CREATININE 1.23   < > 1.39* 1.35* 1.20  CALCIUM 8.4*   < > 8.3* 8.3* 8.5*  ALBUMIN 2.4*  --  2.3*  --   --   GFRNONAA >60   < > 58* >60 >60  GFRAA >60   < > >60 >60 >60  ANIONGAP 10   < > 9 7 8    < > = values in this interval not displayed.     Hematology Recent Labs  Lab 10/25/19 0602 10/26/19 0327 10/27/19 0459  WBC 12.0* 11.5* 11.1*  RBC 3.83* 3.65* 3.57*  HGB 11.3* 10.6* 10.4*  HCT 35.7* 33.5* 33.2*  MCV 93.2 91.8 93.0  MCH 29.5 29.0 29.1  MCHC 31.7 31.6 31.3  RDW 12.3 12.3 12.3  PLT 346 332 357    BNP Recent Labs  Lab 10/20/19 2014 10/26/19 0327  BNP 274.3* 288.9*     DDimer No results for input(s): DDIMER in the last 168 hours.   Radiology    No results found.  Cardiac Studies     Patient Profile     49 y.o. male with a hx of chronic systolic CHF secondary to mixed ICM/NICM, diffuse 3V CAD with diabetic vasculopathy  on cath 2019, has refused ICD, HTN, HLD, DM, CVA (in [place] 2019), bipolar disorder/schizophrenia,priorOSA (sleep study 2018 without significant OSA), obesitywho is being seen today for the evaluation of pre-op clearance & CHF  Assessment & Plan    1.  Acute on chronic systolic congestive heart failure.  His ejection fraction is 25 to 30%.  His creatinine is back back down to normal.  He was on high dose of Entresto prior to admission.  We will start him on   Entresto 49-51 mg twice a day. Will consider adding lasix in the next day or so   2.  CAD :   He has diffuse diabetic coronary artery disease-not amenable to intervention.  Continue medical therapy.  He is not having any angina.  Continue statin.  3.  Hypertension: Continue carvedilol.  Will we are starting Entresto today.  4.  Acute kidney injury: His creatinine is almost back down to normal.  We will starting Entresto today.  We will consider adding Lasix in the next day or so.      For questions or updates, please contact [facility] Please consult [email or web address] for contact info under        Signed, 2019, MD  10/27/2019, 9:58 AM

## 2019-10-28 LAB — GLUCOSE, CAPILLARY
Glucose-Capillary: 105 mg/dL — ABNORMAL HIGH (ref 70–99)
Glucose-Capillary: 130 mg/dL — ABNORMAL HIGH (ref 70–99)
Glucose-Capillary: 123 mg/dL — ABNORMAL HIGH (ref 70–99)
Glucose-Capillary: 149 mg/dL — ABNORMAL HIGH (ref 70–99)

## 2019-10-28 LAB — CBC
HCT: 34.7 % — ABNORMAL LOW (ref 39.0–52.0)
Hemoglobin: 11 g/dL — ABNORMAL LOW (ref 13.0–17.0)
MCH: 29.1 pg (ref 26.0–34.0)
MCHC: 31.7 g/dL (ref 30.0–36.0)
MCV: 91.8 fL (ref 80.0–100.0)
Platelets: 369 10*3/uL (ref 150–400)
RBC: 3.78 MIL/uL — ABNORMAL LOW (ref 4.22–5.81)
RDW: 12.2 % (ref 11.5–15.5)
WBC: 11 10*3/uL — ABNORMAL HIGH (ref 4.0–10.5)
nRBC: 0 % (ref 0.0–0.2)

## 2019-10-28 LAB — BASIC METABOLIC PANEL
Anion gap: 9 (ref 5–15)
BUN: 14 mg/dL (ref 6–20)
CO2: 28 mmol/L (ref 22–32)
Calcium: 8.9 mg/dL (ref 8.9–10.3)
Chloride: 104 mmol/L (ref 98–111)
Creatinine, Ser: 1.29 mg/dL — ABNORMAL HIGH (ref 0.61–1.24)
GFR calc Af Amer: >60 mL/min (ref >60)
GFR calc non Af Amer: >60 mL/min (ref >60)
Glucose, Bld: 120 mg/dL — ABNORMAL HIGH (ref 70–99)
Potassium: 4.3 mmol/L (ref 3.5–5.1)
Sodium: 141 mmol/L (ref 135–145)
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
## 2019-10-28 NOTE — Progress Notes (Signed)
Progress Note  Patient Name: Mitchell [name] Date of Encounter: 10/28/2019  Primary Cardiologist: Arvilla Meres, MD    Subjective   49 year old gentleman with a history of coronary artery disease and congestive heart failure.  He has diffuse diabetic three-vessel coronary artery disease by heart catheterization.  His stenosis are not amenable to revascularization.  He is refused ICD.  He has a history of hypertension, hyperlipidemia, diabetes mellitus, CVA.  He has history of borderline disorder/schizophrenia and has had some obstructive sleep sleep apnea in the past.  Renal function is remained stable.  His creatinine is 1.29 which is very close to baseline. Blood pressure remains mildly elevated at this point.  We should be able to continue to titrate up his CHF medications to his home dose in fairly short time.  He   has been on spironolactone in the past but it was discontinued for hyperkalemia    Inpatient Medications    Scheduled Meds: . aspirin EC  81 mg Oral Daily  . atorvastatin  80 mg Oral QHS  . carvedilol  9.375 mg Oral BID WC  . heparin  5,000 Units Subcutaneous Q8H  . insulin aspart  0-15 Units Subcutaneous TID WC  . insulin aspart  0-5 Units Subcutaneous QHS  . insulin glargine  60 Units Subcutaneous Daily  . multivitamin with minerals  1 tablet Oral Daily  . nutrition supplement (JUVEN)  1 packet Oral BID BM  . potassium chloride  40 mEq Oral Daily  . Ensure Max Protein  11 oz Oral BID  . sacubitril-valsartan  1 tablet Oral BID  . silver sulfADIAZINE   Topical BID  . sodium chloride flush  3 mL Intravenous Q12H   Continuous Infusions: . sodium chloride 250 mL (10/27/19 0606)  . cefTRIAXone (ROCEPHIN)  IV 2 g (10/27/19 1500)   PRN Meds: sodium chloride, acetaminophen, alum & mag hydroxide-simeth, morphine injection, ondansetron (ZOFRAN) IV, oxyCODONE, oxyCODONE-acetaminophen, polyethylene glycol, senna-docusate, sodium chloride flush   Vital Signs      Vitals:   10/27/19 1954 10/28/19 0629 10/28/19 0636 10/28/19 0822  BP: 127/72 (!) 130/92  (!) 144/68  Pulse: 73 80  74  Resp: 18 19  18   Temp: 98.4 F (36.9 C) 98.1 F (36.7 C)  98.1 F (36.7 C)  TempSrc: Oral Oral  Oral  SpO2: 97% 98%  100%  Weight:   134.4 kg   Height:        Intake/Output Summary (Last 24 hours) at 10/28/2019 0829 Last data filed at 10/28/2019 0700 Gross per 24 hour  Intake 2592 ml  Output 1775 ml  Net 817 ml   Last 3 Weights 10/28/2019 10/27/2019 10/26/2019  Weight (lbs) 296 lb 4.8 oz 302 lb 7.5 oz 292 lb 15.9 oz  Weight (kg) 134.4 kg 137.2 kg 132.9 kg      Telemetry    NSR - Personally Reviewed  ECG     - Personally Reviewed  Physical Exam   Physical Exam: Blood pressure (!) 144/68, pulse 74, temperature 98.1 F (36.7 C), temperature source Oral, resp. rate 18, height 6' (1.829 m), weight 134.4 kg, SpO2 100 %.  GEN:  Obese male,  NAD  HEENT: Normal NECK: No JVD; No carotid bruits LYMPHATICS: No lymphadenopathy CARDIAC: RRR   RESPIRATORY:  Clear to auscultation without rales, wheezing or rhonchi  ABDOMEN: Soft, non-tender, non-distended MUSCULOSKELETAL:   Bandaged legs  SKIN: Warm and dry NEUROLOGIC:  Alert and oriented x 3   Labs    High  Sensitivity Troponin:   Recent Labs  Lab 10/20/19 2015  TROPONINIHS 17      Chemistry Recent Labs  Lab 10/24/19 1207 10/24/19 1207 10/25/19 0602 10/26/19 0327 10/27/19 0459  NA 141   < > 141 140 142  K 3.3*   < > 4.1 4.1 4.5  CL 104   < > 104 105 107  CO2 27   < > 28 28 27   GLUCOSE 161*   < > 186* 123* 114*  BUN 15   < > 14 11 14   CREATININE 1.23   < > 1.39* 1.35* 1.20  CALCIUM 8.4*   < > 8.3* 8.3* 8.5*  ALBUMIN 2.4*  --  2.3*  --   --   GFRNONAA >60   < > 58* >60 >60  GFRAA >60   < > >60 >60 >60  ANIONGAP 10   < > 9 7 8    < > = values in this interval not displayed.     Hematology Recent Labs  Lab 10/26/19 0327 10/27/19 0459 10/28/19 0658  WBC 11.5* 11.1* 11.0*  RBC 3.65*  3.57* 3.78*  HGB 10.6* 10.4* 11.0*  HCT 33.5* 33.2* 34.7*  MCV 91.8 93.0 91.8  MCH 29.0 29.1 29.1  MCHC 31.6 31.3 31.7  RDW 12.3 12.3 12.2  PLT 332 357 369    BNP Recent Labs  Lab 10/26/19 0327  BNP 288.9*     DDimer No results for input(s): DDIMER in the last 168 hours.   Radiology    No results found.  Cardiac Studies     Patient Profile     49 y.o. male with a hx of chronic systolic CHF secondary to mixed ICM/NICM, diffuse 3V CAD with diabetic vasculopathy on cath 2019, has refused ICD, HTN, HLD, DM, CVA (in [place] 2019), bipolar disorder/schizophrenia,priorOSA (sleep study 2018 without significant OSA), obesitywho is being seen today for the evaluation of pre-op clearance & CHF  Assessment & Plan    1.  Acute on chronic systolic congestive heart failure.   We have restarted Entresto 49-51 mg twice a day.  We have held off on Lasix for the time being given his recent episode of acute kidney injury.  He appears to be euvolemic.  His quite likely that he is eating a lower salt diet than what he typically does at home.  2.  CAD :   He denies having any angina.  3.  Hypertension: Continue current medications.  We will consider increasing Entresto tomorrow.  4.  Acute kidney injury: Creatinine is about back to his baseline.     For questions or updates, please contact [place] Please consult [email or web address] for contact info under        Signed, Mertie Moores, MD  10/28/2019, 8:29 AM

## 2019-10-29 LAB — GLUCOSE, CAPILLARY
Glucose-Capillary: 126 mg/dL — ABNORMAL HIGH (ref 70–99)
Glucose-Capillary: 176 mg/dL — ABNORMAL HIGH (ref 70–99)
Glucose-Capillary: 134 mg/dL — ABNORMAL HIGH (ref 70–99)
Glucose-Capillary: 142 mg/dL — ABNORMAL HIGH (ref 70–99)

## 2019-10-29 LAB — BASIC METABOLIC PANEL
Anion gap: 9 (ref 5–15)
BUN: 13 mg/dL (ref 6–20)
CO2: 28 mmol/L (ref 22–32)
Calcium: 8.8 mg/dL — ABNORMAL LOW (ref 8.9–10.3)
Chloride: 102 mmol/L (ref 98–111)
Creatinine, Ser: 1.23 mg/dL (ref 0.61–1.24)
GFR calc Af Amer: >60 mL/min (ref >60)
GFR calc non Af Amer: >60 mL/min (ref >60)
Glucose, Bld: 113 mg/dL — ABNORMAL HIGH (ref 70–99)
Potassium: 4.2 mmol/L (ref 3.5–5.1)
Sodium: 139 mmol/L (ref 135–145)

## 2019-10-29 MED ORDER — CHLORHEXIDINE GLUCONATE CLOTH 2 % EX PADS
6.0000 | MEDICATED_PAD | Freq: Every day | CUTANEOUS | Status: DC
  Administered 2019-10-29 – 2019-10-31 (×3): 6 via TOPICAL

## 2019-10-29 MED ORDER — SACUBITRIL-VALSARTAN 97-103 MG PO TABS
1.0000 | ORAL_TABLET | Freq: Two times a day (BID) | ORAL | Status: DC
  Administered 2019-10-29 – 2019-11-01 (×6): 1 via ORAL
  Filled 2019-10-29 (×6): qty 1

## 2019-10-29 MED ORDER — MUPIROCIN 2 % EX OINT
1.0000 "application " | TOPICAL_OINTMENT | Freq: Two times a day (BID) | CUTANEOUS | Status: AC
  Administered 2019-10-30 – 2019-11-03 (×10): 1 via NASAL
  Filled 2019-10-29: qty 22
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
## 2019-10-29 NOTE — Progress Notes (Signed)
Chaplain engaged in initial visit with Mitchell [name]).  Mitchell [name] expressed wanting to be able to talk about his faith and what he has been enduring with a chaplain.  He has seen his recent time at the hospital as a means for God to communicate with him and show him what to do concerning specific things and people in his life.  He described this time in his life as a season of listening and letting God lead.  He says he values laying in the dark and having the window shades down because he spends that time talking to God.  He stated that he gets a lot of peace from that.  Mitchell [name] shared with the chaplain the various things that have been on his heart concerning his marriage, health and children.  Mitchell [name] believes that counseling will be a great resource for him and his wife both individually and collectively.  Chaplain stated she would provide a counseling resource to him.  Chaplain provided the ministries of empathic listening, as well prayer.

## 2019-10-30 LAB — CBC
HCT: 35.2 % — ABNORMAL LOW (ref 39.0–52.0)
Hemoglobin: 11.1 g/dL — ABNORMAL LOW (ref 13.0–17.0)
MCH: 28.9 pg (ref 26.0–34.0)
MCHC: 31.5 g/dL (ref 30.0–36.0)
MCV: 91.7 fL (ref 80.0–100.0)
Platelets: 404 10*3/uL — ABNORMAL HIGH (ref 150–400)
RBC: 3.84 MIL/uL — ABNORMAL LOW (ref 4.22–5.81)
RDW: 12.3 % (ref 11.5–15.5)
WBC: 10.3 10*3/uL (ref 4.0–10.5)
nRBC: 0 % (ref 0.0–0.2)

## 2019-10-30 LAB — BASIC METABOLIC PANEL
Anion gap: 11 (ref 5–15)
BUN: 13 mg/dL (ref 6–20)
CO2: 28 mmol/L (ref 22–32)
Calcium: 9.1 mg/dL (ref 8.9–10.3)
Chloride: 100 mmol/L (ref 98–111)
Creatinine, Ser: 1.28 mg/dL — ABNORMAL HIGH (ref 0.61–1.24)
GFR calc Af Amer: >60 mL/min (ref >60)
GFR calc non Af Amer: >60 mL/min (ref >60)
Glucose, Bld: 107 mg/dL — ABNORMAL HIGH (ref 70–99)
Potassium: 4.4 mmol/L (ref 3.5–5.1)
Sodium: 139 mmol/L (ref 135–145)

## 2019-10-30 LAB — GLUCOSE, CAPILLARY
Glucose-Capillary: 103 mg/dL — ABNORMAL HIGH (ref 70–99)
Glucose-Capillary: 106 mg/dL — ABNORMAL HIGH (ref 70–99)
Glucose-Capillary: 153 mg/dL — ABNORMAL HIGH (ref 70–99)
Glucose-Capillary: 130 mg/dL — ABNORMAL HIGH (ref 70–99)

## 2019-10-30 MED ORDER — ASCORBIC ACID 500 MG PO TABS
500.0000 mg | ORAL_TABLET | Freq: Every day | ORAL | Status: DC
  Administered 2019-10-30 – 2019-11-03 (×5): 500 mg via ORAL
  Filled 2019-10-30 (×5): qty 1

## 2019-10-30 MED ORDER — NEPRO/CARBSTEADY PO LIQD
237.0000 mL | Freq: Two times a day (BID) | ORAL | Status: DC
  Administered 2019-10-31: 09:00:00 237 mL via ORAL

## 2019-10-30 NOTE — TOC Progression Note (Signed)
Transition of Care [facility]) - Progression Note    Patient Details  Name: DEADRICK STIDD MRN: [id number] Date of Birth: 20-May-1971  Transition of [facility] CM/SW Contact  Leone Haven, RN Phone Number: 10/30/2019, 4:20 PM  Clinical Narrative:    NCM called [facility] they are closed today 4/1 and will reopen on 4/2, will need an apt to be made for patient [phone].    Expected Discharge Plan: Home w Home Health Services Barriers to Discharge: Continued Medical Work up  Expected Discharge Plan and Services Expected Discharge Plan: Home w Home Health Services In-house Referral: NA                                             Social Determinants of Health (SDOH) Interventions    Readmission Risk Interventions No flowsheet data found.
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
## 2019-10-30 NOTE — Progress Notes (Signed)
Orthopedic Tech Progress Note Patient Details:  Mitchell [name] 04/08/1971 [id number] I wanted night shift and day shift to know how to apply the CAM WALKING BOOT. So I dropped off boot around 1834 and went back to show them  Ortho Devices Type of Ortho Device: CAM walker Ortho Device/Splint Location: BLE Ortho Device/Splint Interventions: Ordered, Application   Post Interventions Patient Tolerated: Well Instructions Provided: Care of device   [name] 10/30/2019, 7:31 PM

## 2019-10-30 NOTE — Progress Notes (Signed)
Left thigh wound with significant bleeding through dressing. Dressing changed.
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
## 2019-10-31 LAB — GLUCOSE, CAPILLARY
Glucose-Capillary: 110 mg/dL — ABNORMAL HIGH (ref 70–99)
Glucose-Capillary: 141 mg/dL — ABNORMAL HIGH (ref 70–99)
Glucose-Capillary: 108 mg/dL — ABNORMAL HIGH (ref 70–99)
Glucose-Capillary: 122 mg/dL — ABNORMAL HIGH (ref 70–99)

## 2019-10-31 LAB — CBC
HCT: 37.5 % — ABNORMAL LOW (ref 39.0–52.0)
Hemoglobin: 11.8 g/dL — ABNORMAL LOW (ref 13.0–17.0)
MCH: 29.4 pg (ref 26.0–34.0)
MCHC: 31.5 g/dL (ref 30.0–36.0)
MCV: 93.5 fL (ref 80.0–100.0)
Platelets: 417 10*3/uL — ABNORMAL HIGH (ref 150–400)
RBC: 4.01 MIL/uL — ABNORMAL LOW (ref 4.22–5.81)
RDW: 12.3 % (ref 11.5–15.5)
WBC: 11.4 10*3/uL — ABNORMAL HIGH (ref 4.0–10.5)
nRBC: 0 % (ref 0.0–0.2)

## 2019-10-31 LAB — BASIC METABOLIC PANEL
Anion gap: 8 (ref 5–15)
BUN: 18 mg/dL (ref 6–20)
CO2: 29 mmol/L (ref 22–32)
Calcium: 8.9 mg/dL (ref 8.9–10.3)
Chloride: 102 mmol/L (ref 98–111)
Creatinine, Ser: 1.27 mg/dL — ABNORMAL HIGH (ref 0.61–1.24)
GFR calc Af Amer: >60 mL/min (ref >60)
GFR calc non Af Amer: >60 mL/min (ref >60)
Glucose, Bld: 126 mg/dL — ABNORMAL HIGH (ref 70–99)
Potassium: 5.6 mmol/L — ABNORMAL HIGH (ref 3.5–5.1)
Sodium: 139 mmol/L (ref 135–145)
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
## 2019-10-31 NOTE — Progress Notes (Signed)
Wound care and dressing changes preformed on patient's bilateral foot wounds and left thigh wound. After old dressings were removed and site was cleansed, Xeroform and 4 x 4 gauzes were applied to bilateral foot wounds, then wrapped with Kerlix and ACE bandages. Bilateral foot wound beds were pink/red with no drainage. After left thigh wound dressings and alginate were removed from wound bed, Xeroform, 4 x 4 gauzes, and ABD pads were placed, then Kerlix wraps applied. Left thigh wound bed was pink/red with minimal sanguineous drainage. Patient tolerated all dressing changes well with little to no discomfort.
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
## 2019-10-31 NOTE — Progress Notes (Signed)
   Pt is doing well from a CHF standpoint  Cont current meds.   Cont wound care.  [facility] will sign off.   Medication Recommendations:  Cont current meds . Other recommendations (labs, testing, etc):   Follow up as an outpatient:  With Dr. Gala Romney as OP     Kristeen Miss, MD  10/31/2019 10:23 AM    [facility] [address] [place],  [address] [place], [place]  [address] Phone: [phone]; Fax: [phone]
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
## 2019-11-01 LAB — BASIC METABOLIC PANEL
Anion gap: 11 (ref 5–15)
BUN: 19 mg/dL (ref 6–20)
CO2: 27 mmol/L (ref 22–32)
Calcium: 9 mg/dL (ref 8.9–10.3)
Chloride: 101 mmol/L (ref 98–111)
Creatinine, Ser: 1.21 mg/dL (ref 0.61–1.24)
GFR calc Af Amer: >60 mL/min (ref >60)
GFR calc non Af Amer: >60 mL/min (ref >60)
Glucose, Bld: 169 mg/dL — ABNORMAL HIGH (ref 70–99)
Potassium: 4.4 mmol/L (ref 3.5–5.1)
Sodium: 139 mmol/L (ref 135–145)

## 2019-11-01 LAB — GLUCOSE, CAPILLARY
Glucose-Capillary: 124 mg/dL — ABNORMAL HIGH (ref 70–99)
Glucose-Capillary: 120 mg/dL — ABNORMAL HIGH (ref 70–99)
Glucose-Capillary: 152 mg/dL — ABNORMAL HIGH (ref 70–99)
Glucose-Capillary: 120 mg/dL — ABNORMAL HIGH (ref 70–99)

## 2019-11-01 MED ORDER — SODIUM POLYSTYRENE SULFONATE 15 GM/60ML PO SUSP
15.0000 g | Freq: Once | ORAL | Status: AC
  Administered 2019-11-01: 15 g via ORAL
  Filled 2019-11-01: qty 60

## 2019-11-01 NOTE — TOC Progression Note (Signed)
Transition of Care [facility]) - Progression Note    Patient Details  Name: Mitchell [name] MRN: [id number] Date of Birth: 06-Dec-1970  Transition of Care [facility]) CM/SW [place], [place] Phone Number: [phone] 11/01/2019, 11:36 AM  Clinical Narrative:     CSW met with patient about PT recommendation of a SNF. CSW explained the SNF process. Patient informed CSW that he would rather return home due to [place] in the facilities. CSW inquired about supports at home and he stated that he lives with his spouse and daughter. At this time, patient is declining a SNF. RN case manager has been alerted about patient's decision.  TOC team will continue to assist with discharge planning needs.    Expected Discharge Plan: [place] Barriers to Discharge: Continued Medical Work up  Expected Discharge Plan and Services Expected Discharge Plan: [place] In-house Referral: NA                                             Social Determinants of Health (SDOH) Interventions    Readmission Risk Interventions No flowsheet data found.

## 2019-11-01 NOTE — Plan of Care (Signed)
  Problem: Health Behavior/Discharge Planning: Goal: Ability to manage health-related needs will improve Outcome: Progressing   Problem: Clinical Measurements: Goal: Ability to maintain clinical measurements within normal limits will improve Outcome: Progressing Goal: Will remain free from infection Outcome: Progressing Goal: Diagnostic test results will improve Outcome: Progressing Goal: Respiratory complications will improve Outcome: Progressing Goal: Cardiovascular complication will be avoided Outcome: Progressing   Problem: Activity: Goal: Risk for activity intolerance will decrease Outcome: Progressing   Problem: Coping: Goal: Level of anxiety will decrease Outcome: Progressing   Problem: Elimination: Goal: Will not experience complications related to bowel motility Outcome: Progressing Goal: Will not experience complications related to urinary retention Outcome: Progressing   Problem: Pain Managment: Goal: General experience of comfort will improve Outcome: Progressing   Problem: Safety: Goal: Ability to remain free from injury will improve Outcome: Progressing   Problem: Skin Integrity: Goal: Risk for impaired skin integrity will decrease Outcome: Progressing   Problem: Education: Goal: Ability to demonstrate management of disease process will improve Outcome: Progressing Goal: Ability to verbalize understanding of medication therapies will improve Outcome: Progressing Goal: Individualized Educational Video(s) Outcome: Progressing   Problem: Activity: Goal: Capacity to carry out activities will improve Outcome: Progressing   Problem: Cardiac: Goal: Ability to achieve and maintain adequate cardiopulmonary perfusion will improve Outcome: Progressing   

## 2019-11-01 NOTE — Progress Notes (Addendum)
Patient's big toe on his right foot started to bleed during a trip to the restroom. Cleansed the area with normal saline before performing daily wound dressing changes on the patient. Applied silver cream and xeroform on affect feet, 4x4 gauze on feet and legs, and wrapped with ace bandage; performed on both legs. Unable to do dressing change on thigh because of lack of kerflex gauze. Notified NS to order kerflex gauze and notified oncoming nurse in regards to that dressing change. Patient requested for the wound care team to come assess. Wound consult has been ordered.
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
## 2019-11-02 LAB — BASIC METABOLIC PANEL
Anion gap: 10 (ref 5–15)
Anion gap: 12 (ref 5–15)
BUN: 20 mg/dL (ref 6–20)
BUN: 20 mg/dL (ref 6–20)
CO2: 29 mmol/L (ref 22–32)
CO2: 27 mmol/L (ref 22–32)
Calcium: 9.2 mg/dL (ref 8.9–10.3)
Calcium: 9 mg/dL (ref 8.9–10.3)
Chloride: 101 mmol/L (ref 98–111)
Chloride: 101 mmol/L (ref 98–111)
Creatinine, Ser: 1.15 mg/dL (ref 0.61–1.24)
Creatinine, Ser: 1.2 mg/dL (ref 0.61–1.24)
GFR calc Af Amer: >60 mL/min (ref >60)
GFR calc Af Amer: >60 mL/min (ref >60)
GFR calc non Af Amer: >60 mL/min (ref >60)
GFR calc non Af Amer: >60 mL/min (ref >60)
Glucose, Bld: 120 mg/dL — ABNORMAL HIGH (ref 70–99)
Glucose, Bld: 118 mg/dL — ABNORMAL HIGH (ref 70–99)
Potassium: 4.8 mmol/L (ref 3.5–5.1)
Potassium: 4.2 mmol/L (ref 3.5–5.1)
Sodium: 140 mmol/L (ref 135–145)
Sodium: 140 mmol/L (ref 135–145)

## 2019-11-02 LAB — CBC
HCT: 37 % — ABNORMAL LOW (ref 39.0–52.0)
Hemoglobin: 11.8 g/dL — ABNORMAL LOW (ref 13.0–17.0)
MCH: 28.9 pg (ref 26.0–34.0)
MCHC: 31.9 g/dL (ref 30.0–36.0)
MCV: 90.7 fL (ref 80.0–100.0)
Platelets: 396 10*3/uL (ref 150–400)
RBC: 4.08 MIL/uL — ABNORMAL LOW (ref 4.22–5.81)
RDW: 12.3 % (ref 11.5–15.5)
WBC: 11.7 10*3/uL — ABNORMAL HIGH (ref 4.0–10.5)
nRBC: 0 % (ref 0.0–0.2)

## 2019-11-02 LAB — GLUCOSE, CAPILLARY
Glucose-Capillary: 113 mg/dL — ABNORMAL HIGH (ref 70–99)
Glucose-Capillary: 120 mg/dL — ABNORMAL HIGH (ref 70–99)
Glucose-Capillary: 107 mg/dL — ABNORMAL HIGH (ref 70–99)
Glucose-Capillary: 132 mg/dL — ABNORMAL HIGH (ref 70–99)

## 2019-11-02 MED ORDER — SACUBITRIL-VALSARTAN 24-26 MG PO TABS
1.0000 | ORAL_TABLET | Freq: Two times a day (BID) | ORAL | Status: DC
  Administered 2019-11-02 – 2019-11-03 (×3): 1 via ORAL
  Filled 2019-11-02 (×3): qty 1

## 2019-11-02 MED ORDER — FUROSEMIDE 40 MG PO TABS
40.0000 mg | ORAL_TABLET | Freq: Every day | ORAL | Status: DC
  Administered 2019-11-02 – 2019-11-03 (×2): 40 mg via ORAL
  Filled 2019-11-02 (×2): qty 1
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
## 2019-11-02 NOTE — Progress Notes (Signed)
Chaplain was requested back at patient's room when his estranged wife came to see him. The patient wanted to know about the "papers that the other gal left." Chaplain was confused because the patient himself was unclear.  It appeared to her that the patient was confused by the roles performed by the chaplain versus the social worker.  Having learned that the patient's wife has informed him she's leaving him, and that their rental home they shared is being sold by the landlord, the chaplain encouraged the patient to seek guidance from the social worker as to what he may need to do upon discharge.  The patient had previously told the chaplain he did not want to go to SNF but the chaplain suggested that given his situation, he may need to have an open mind about his future. The patient agreed he would ask to speak to the social worker again when he came to realize that chaplains don't do  discharge planning nor are involved in insurance and financial concerns.  The chaplain will refer this patient to the unit chaplain who serves mid-week. For further support.   Rev. Lynnell Chad Pager [phone]

## 2019-11-02 NOTE — Plan of Care (Signed)
  Problem: Health Behavior/Discharge Planning: Goal: Ability to manage health-related needs will improve Outcome: Progressing   Problem: Clinical Measurements: Goal: Ability to maintain clinical measurements within normal limits will improve Outcome: Progressing Goal: Will remain free from infection Outcome: Progressing Goal: Diagnostic test results will improve Outcome: Progressing Goal: Respiratory complications will improve Outcome: Progressing Goal: Cardiovascular complication will be avoided Outcome: Progressing   Problem: Activity: Goal: Risk for activity intolerance will decrease Outcome: Progressing   Problem: Coping: Goal: Level of anxiety will decrease Outcome: Progressing   Problem: Elimination: Goal: Will not experience complications related to bowel motility Outcome: Progressing Goal: Will not experience complications related to urinary retention Outcome: Progressing   Problem: Pain Managment: Goal: General experience of comfort will improve Outcome: Progressing   Problem: Safety: Goal: Ability to remain free from injury will improve Outcome: Progressing   Problem: Skin Integrity: Goal: Risk for impaired skin integrity will decrease Outcome: Progressing   Problem: Education: Goal: Ability to demonstrate management of disease process will improve Outcome: Progressing Goal: Ability to verbalize understanding of medication therapies will improve Outcome: Progressing Goal: Individualized Educational Video(s) Outcome: Progressing   Problem: Activity: Goal: Capacity to carry out activities will improve Outcome: Progressing   Problem: Cardiac: Goal: Ability to achieve and maintain adequate cardiopulmonary perfusion will improve Outcome: Progressing   

## 2019-11-03 LAB — BASIC METABOLIC PANEL
Anion gap: 14 (ref 5–15)
BUN: 18 mg/dL (ref 6–20)
CO2: 26 mmol/L (ref 22–32)
Calcium: 9.2 mg/dL (ref 8.9–10.3)
Chloride: 100 mmol/L (ref 98–111)
Creatinine, Ser: 1.24 mg/dL (ref 0.61–1.24)
GFR calc Af Amer: >60 mL/min (ref >60)
GFR calc non Af Amer: >60 mL/min (ref >60)
Glucose, Bld: 117 mg/dL — ABNORMAL HIGH (ref 70–99)
Potassium: 4.2 mmol/L (ref 3.5–5.1)
Sodium: 140 mmol/L (ref 135–145)

## 2019-11-03 LAB — CBC
HCT: 38.8 % — ABNORMAL LOW (ref 39.0–52.0)
Hemoglobin: 12.4 g/dL — ABNORMAL LOW (ref 13.0–17.0)
MCH: 29.4 pg (ref 26.0–34.0)
MCHC: 32 g/dL (ref 30.0–36.0)
MCV: 91.9 fL (ref 80.0–100.0)
Platelets: 370 10*3/uL (ref 150–400)
RBC: 4.22 MIL/uL (ref 4.22–5.81)
RDW: 12.7 % (ref 11.5–15.5)
WBC: 10.7 10*3/uL — ABNORMAL HIGH (ref 4.0–10.5)
nRBC: 0 % (ref 0.0–0.2)

## 2019-11-03 LAB — GLUCOSE, CAPILLARY
Glucose-Capillary: 108 mg/dL — ABNORMAL HIGH (ref 70–99)
Glucose-Capillary: 108 mg/dL — ABNORMAL HIGH (ref 70–99)

## 2019-11-03 MED ORDER — ASPIRIN 81 MG PO TBEC: 81.0000 mg | DELAYED_RELEASE_TABLET | Freq: Every day | ORAL | Status: DC

## 2019-11-03 MED ORDER — BASAGLAR KWIKPEN 100 UNIT/ML ~~LOC~~ SOPN: 80.0000 [IU] | PEN_INJECTOR | SUBCUTANEOUS | 99 refills | Status: DC

## 2019-11-03 MED ORDER — ACETAMINOPHEN 325 MG PO TABS: 650.0000 mg | ORAL_TABLET | ORAL | Status: DC | PRN

## 2019-11-03 MED ORDER — FUROSEMIDE 40 MG PO TABS: 40.0000 mg | ORAL_TABLET | Freq: Every day | ORAL | 0 refills | Status: DC

## 2019-11-03 MED ORDER — SACUBITRIL-VALSARTAN 24-26 MG PO TABS: 1.0000 | ORAL_TABLET | Freq: Two times a day (BID) | ORAL | 0 refills | Status: DC

## 2019-11-03 NOTE — TOC Transition Note (Addendum)
Transition of Care [facility]) - CM/SW Discharge Note   Patient Details  Name: Mitchell [name] MRN: [id number] Date of Birth: 1971-01-21  [facility] CM/SW Contact:  Leone Haven, RN Phone Number: 11/03/2019, 9:46 AM   Clinical Narrative:    Patient for dc today, will need HHRN, HHPT set up, NCM offered choice with medicare.gov list, he states he really does not have a preference,  NCM made referral to [facility], awaiting call back from Tiffany with [facility] to see if they can take referral. [facility] does not have staff in the area.  NCM checked, with [facility], [facility], [facility], [facility] , [place] [place], [place], [place].  They all did not have no available staff for wound care.  Wife was here at hospital today,and Alvira Philips Staff RN educated wife on how to do the dressing  Chagnes.  Wife states she feels comfortable doing the dressing changes since she has done them before.  Per MD they will not be using the silavadene at home.   Per plastics PA, for daily dressing changes with xeroform to cover all wounds followed by 4 x 4 guaze, kerlix wrap.  He can do ace wraps on feet as needed for cushioning in the CAM boot.  He should keep the CAM boot on to prevent from shearing the grafts.  NWBon left foot dut midfoot plantar graft. Follow up scheduled for plastics on 11/13/19.  Wheelchair and rolling walker will be delivered to patient's home by [employer].   Final next level of care: Home w Home Health Services Barriers to Discharge: No Barriers Identified   Patient Goals and CMS Choice Patient states their goals for this hospitalization and ongoing recovery are:: get better CMS Medicare.gov Compare Post Acute Care list provided to:: Patient Choice offered to / list presented to : Patient  Discharge Placement                       Discharge Plan and Services In-house Referral: NA                DME Agency: NA       HH Arranged: RN, PT HH Agency: [facility] Date  HH Agency Contacted: 11/03/19 Time HH Agency Contacted: [phone] Representative spoke with at [facility] Agency: Tiffany  Social Determinants of Health (SDOH) Interventions     Readmission Risk Interventions No flowsheet data found.

## 2019-11-03 NOTE — Care Management Important Message (Signed)
Important Message  Patient Details  Name: Mitchell [name] MRN: [id number] Date of Birth: May 30, 1971   Medicare Important Message Given:  Yes  Patient left prior to the IM being delivered.  IM mailed to the patient    Dorena Bodo 11/03/2019, 4:34 PM

## 2019-11-04 ENCOUNTER — Telehealth: Payer: Self-pay | Admitting: *Deleted

## 2019-11-04 NOTE — Telephone Encounter (Signed)
1st attempt. Unable to reach patient.   
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
## 2019-11-05 ENCOUNTER — Other Ambulatory Visit: Payer: Self-pay
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
## 2019-11-05 NOTE — Telephone Encounter (Signed)
I have made two attempts and have been unable to reach patient. Pt has hospital follow up scheduled w/ PCP 11/06/19 @1120 .

## 2019-11-06 ENCOUNTER — Other Ambulatory Visit: Payer: Self-pay

## 2019-11-06 ENCOUNTER — Ambulatory Visit (INDEPENDENT_AMBULATORY_CARE_PROVIDER_SITE_OTHER): Payer: No Typology Code available for payment source | Admitting: Family Medicine

## 2019-11-06 ENCOUNTER — Encounter: Payer: Self-pay | Admitting: Family Medicine

## 2019-11-06 VITALS — BP 151/91 | HR 78 | Temp 98.0°F

## 2019-11-06 DIAGNOSIS — T25322A Burn of third degree of left foot, initial encounter: Secondary | ICD-10-CM

## 2019-11-06 DIAGNOSIS — Z09 Encounter for follow-up examination after completed treatment for conditions other than malignant neoplasm: Secondary | ICD-10-CM | POA: Diagnosis not present

## 2019-11-06 DIAGNOSIS — Z794 Long term (current) use of insulin: Secondary | ICD-10-CM | POA: Diagnosis not present

## 2019-11-06 DIAGNOSIS — I10 Essential (primary) hypertension: Secondary | ICD-10-CM | POA: Diagnosis not present

## 2019-11-06 DIAGNOSIS — E119 Type 2 diabetes mellitus without complications: Secondary | ICD-10-CM | POA: Diagnosis not present

## 2019-11-06 DIAGNOSIS — T25321A Burn of third degree of right foot, initial encounter: Secondary | ICD-10-CM

## 2019-11-06 DIAGNOSIS — E782 Mixed hyperlipidemia: Secondary | ICD-10-CM | POA: Diagnosis not present

## 2019-11-06 DIAGNOSIS — I5022 Chronic systolic (congestive) heart failure: Secondary | ICD-10-CM | POA: Diagnosis not present

## 2019-11-06 LAB — LIPID PANEL
Cholesterol: 163 mg/dL (ref 0–200)
HDL: 35.5 mg/dL — ABNORMAL LOW (ref >39.00)
LDL Cholesterol: 96 mg/dL (ref 0–99)
NonHDL: 127.86
Total CHOL/HDL Ratio: 5
Triglycerides: 159 mg/dL — ABNORMAL HIGH (ref 0.0–149.0)
VLDL: 31.8 mg/dL (ref 0.0–40.0)

## 2019-11-06 LAB — HEMOGLOBIN A1C: Hgb A1c MFr Bld: 8.7 % — ABNORMAL HIGH (ref 4.6–6.5)

## 2019-11-06 MED ORDER — OXYCODONE-ACETAMINOPHEN 5-325 MG PO TABS: 1.0000 | ORAL_TABLET | Freq: Three times a day (TID) | ORAL | 0 refills | Status: DC | PRN

## 2019-11-06 NOTE — Patient Instructions (Signed)
It was good to see you today- please stop by the lab for an A1c level and cholesterol level I will speak with your plastic surgeon today and let you know what he says- we may need to get you in to the wound clinic prior to your next appt  Please continue taking your medications as directed and let me know if any fever or other concerns in the meantime

## 2019-11-07 ENCOUNTER — Telehealth: Payer: Self-pay | Admitting: Family Medicine
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
## 2019-11-07 NOTE — Telephone Encounter (Signed)
I see an order for home health but not a Careers adviser. Please advise?

## 2019-11-10 ENCOUNTER — Ambulatory Visit (INDEPENDENT_AMBULATORY_CARE_PROVIDER_SITE_OTHER): Payer: No Typology Code available for payment source | Admitting: Surgical

## 2019-11-10 ENCOUNTER — Encounter: Payer: Self-pay | Admitting: Surgical

## 2019-11-10 ENCOUNTER — Other Ambulatory Visit: Payer: Self-pay

## 2019-11-10 VITALS — BP 128/87 | HR 82 | Temp 97.7°F | Ht 71.0 in

## 2019-11-10 DIAGNOSIS — T25229S Burn of second degree of unspecified foot, sequela: Secondary | ICD-10-CM
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
## 2019-11-11 ENCOUNTER — Other Ambulatory Visit: Payer: Self-pay

## 2019-11-13 ENCOUNTER — Ambulatory Visit (INDEPENDENT_AMBULATORY_CARE_PROVIDER_SITE_OTHER): Payer: No Typology Code available for payment source | Admitting: Plastic Surgery

## 2019-11-13 ENCOUNTER — Ambulatory Visit (INDEPENDENT_AMBULATORY_CARE_PROVIDER_SITE_OTHER): Payer: No Typology Code available for payment source | Admitting: Endocrinology

## 2019-11-13 ENCOUNTER — Other Ambulatory Visit: Payer: Self-pay

## 2019-11-13 ENCOUNTER — Encounter: Payer: Self-pay | Admitting: Plastic Surgery

## 2019-11-13 ENCOUNTER — Encounter: Payer: Self-pay | Admitting: Endocrinology

## 2019-11-13 VITALS — BP 165/107 | HR 84 | Temp 97.3°F | Ht 71.0 in | Wt 294.0 lb

## 2019-11-13 VITALS — BP 132/80 | HR 89 | Ht 71.0 in | Wt 294.0 lb

## 2019-11-13 DIAGNOSIS — E118 Type 2 diabetes mellitus with unspecified complications: Secondary | ICD-10-CM | POA: Diagnosis not present

## 2019-11-13 DIAGNOSIS — T25229S Burn of second degree of unspecified foot, sequela: Secondary | ICD-10-CM

## 2019-11-13 MED ORDER — BASAGLAR KWIKPEN 100 UNIT/ML ~~LOC~~ SOPN: 130.0000 [IU] | PEN_INJECTOR | SUBCUTANEOUS | 99 refills | Status: DC

## 2019-11-13 MED ORDER — ACCU-CHEK GUIDE VI STRP: 1.0000 | ORAL_STRIP | Freq: Two times a day (BID) | 3 refills | Status: DC
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
## 2019-11-13 NOTE — Patient Instructions (Signed)
Here is a new meter.  I have sent a prescription to your pharmacy, for strips. check your blood sugar twice a day.  vary the time of day when you check, between before the 3 meals, and at bedtime.  also check if you have symptoms of your blood sugar being too high or too low.  please keep a record of the readings and bring it to your next appointment here (or you can bring the meter itself).  You can write it on any piece of paper.  please call us sooner if your blood sugar goes below 70, or if you have a lot of readings over 200. You can take all 130 units of Basaglar in the morning.  Please come back for a follow-up appointment in 2 weeks, by video, if you prefer.
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
## 2019-11-14 ENCOUNTER — Telehealth: Payer: Self-pay
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
## 2019-11-17 ENCOUNTER — Encounter (HOSPITAL_BASED_OUTPATIENT_CLINIC_OR_DEPARTMENT_OTHER): Payer: No Typology Code available for payment source | Attending: Internal Medicine | Admitting: Internal Medicine

## 2019-11-17 ENCOUNTER — Other Ambulatory Visit: Payer: Self-pay

## 2019-11-17 DIAGNOSIS — E11621 Type 2 diabetes mellitus with foot ulcer: Secondary | ICD-10-CM | POA: Diagnosis present

## 2019-11-17 DIAGNOSIS — I509 Heart failure, unspecified: Secondary | ICD-10-CM | POA: Insufficient documentation

## 2019-11-17 DIAGNOSIS — L97514 Non-pressure chronic ulcer of other part of right foot with necrosis of bone: Secondary | ICD-10-CM | POA: Diagnosis not present

## 2019-11-17 DIAGNOSIS — I110 Hypertensive heart disease with heart failure: Secondary | ICD-10-CM | POA: Insufficient documentation

## 2019-11-17 DIAGNOSIS — L97521 Non-pressure chronic ulcer of other part of left foot limited to breakdown of skin: Secondary | ICD-10-CM | POA: Insufficient documentation

## 2019-11-17 NOTE — Telephone Encounter (Signed)
Verbal orders given  

## 2019-11-18 NOTE — Progress Notes (Addendum)
Mitchell, [name] ([id number]) Visit Report for 11/17/2019 Allergy List Details Patient Name: Date of Service: Mitchell [name], Mitchell [name] 11/17/2019 10:30 AM Medical Record [id number] Patient Account Number: [id number] Date of Birth/Sex: Treating RN: 04/01/1971 (49 y.o. Mitchell [name]) Yevonne Pax Primary Care Kenneshia Rehm: Abbe Amsterdam Other Clinician: Referring Ann Groeneveld: Treating Messina Kosinski/Extender:Robson, Joesph July, Alexander Bergeron in Treatment: 0 Allergies Active Allergies NSAIDS (Non-Steroidal Anti-Inflammatory Drug) Reaction: Rash Severity: Severe Active: 01/22/2015 Allergy Notes Electronic Signature(s) Signed: 11/18/2019 5:39:56 PM By: Yevonne Pax RN Entered By: Yevonne Pax on 11/17/2019 10:56:42 -------------------------------------------------------------------------------- Arrival Information Details Patient Name: Date of Service: Mitchell [name]. 11/17/2019 10:30 AM Medical Record [id number] Patient Account Number: [id number] Date of Birth/Sex: Treating RN: 1971-04-07 (49 y.o. Mitchell [name]) Yevonne Pax Primary Care Oreoluwa Gilmer: Abbe Amsterdam Other Clinician: Referring Chrles Selley: Treating Noel Rodier/Extender:Robson, Joesph July, Alexander Bergeron in Treatment: 0 Visit Information Patient Arrived: Wheel Chair Arrival Time: 10:41 Accompanied By: self Transfer Assistance: None Patient Identification Verified: Yes Secondary Verification Process Completed: Yes Patient Requires Transmission-Based No Precautions: Patient Has Alerts: No Patient Has Alerts: No History Since Last Visit All ordered tests and consults were completed: No Added or deleted any medications: No Any new allergies or adverse reactions: No Had a fall or experienced change in activities of daily living that may affect risk of falls: No Signs or symptoms of abuse/neglect since last visito No Hospitalized since last visit: No Implantable device outside of the clinic excluding cellular tissue based products placed in  the center since last visit: No Pain Present Now: Yes Electronic Signature(s) Signed: 11/18/2019 5:39:56 PM By: Yevonne Pax RN Entered By: Yevonne Pax on 11/17/2019 10:53:10 -------------------------------------------------------------------------------- Clinic Level of Care Assessment Details Patient Name: Date of Service: Mitchell [name], Mitchell [name] 11/17/2019 10:30 AM Medical Record [id number] Patient Account Number: [id number] Date of Birth/Sex: Treating RN: Dec 07, 1970 (49 y.o. Mitchell [name] Primary Care Anani Gu: Abbe Amsterdam Other Clinician: Referring Sakura Denis: Treating Errik Mitchelle/Extender:Robson, Joesph July, Alexander Bergeron in Treatment: 0 Clinic Level of Care Assessment Items TOOL 2 Quantity Score X - Use when only an EandM is performed on the INITIAL visit 1 0 ASSESSMENTS - Nursing Assessment / Reassessment X - General Physical Exam (combine w/ comprehensive assessment (listed just below) 1 20 when performed on new pt. evals) X - Comprehensive Assessment (HX, ROS, Risk Assessments, Wounds Hx, etc.) 1 25 ASSESSMENTS - Wound and Skin Assessment / Reassessment []  - Simple Wound Assessment / Reassessment - one wound 0 X - Complex Wound Assessment / Reassessment - multiple wounds 7 5 []  - Dermatologic / Skin Assessment (not related to wound area) 0 ASSESSMENTS - Ostomy and/or Continence Assessment and Care []  - Incontinence Assessment and Management 0 []  - Ostomy Care Assessment and Management (repouching, etc.) 0 PROCESS - Coordination of Care X - Simple Patient / Family Education for ongoing care 1 15 []  - Complex (extensive) Patient / Family Education for ongoing care 0 X - Staff obtains , Records, Test Results / Process Orders 1 10 X - Staff telephones HHA, Nursing Homes / Clarify orders / etc 1 10 []  - Routine Transfer to another Facility (non-emergent condition) 0 []  - Routine Hospital Admission (non-emergent condition) 0 X - New Admissions / / Ordering NPWT, Apligraf, etc. 1 15 []  - Emergency Hospital Admission (emergent condition) 0 X - Simple Discharge Coordination 1 10 []  - Complex (extensive) Discharge Coordination 0 PROCESS - Special Needs []  - Pediatric / Minor Patient Management 0 []  - Isolation Patient Management 0 []  - Hearing / Language / Visual special needs  0 []  - Assessment of Community assistance (transportation, D/C planning, etc.) 0 []  - Additional assistance / Altered mentation 0 []  - Support Surface(s) Assessment (bed, cushion, seat, etc.) 0 INTERVENTIONS - Wound Cleansing / Measurement X - Wound Imaging (photographs - any number of wounds) 1 5 []  - Wound Tracing (instead of photographs) 0 []  - Simple Wound Measurement - one wound 0 X - Complex Wound Measurement - multiple wounds 7 5 []  - Simple Wound Cleansing - one wound 0 X - Complex Wound Cleansing - multiple wounds 7 5 INTERVENTIONS - Wound Dressings []  - Small Wound Dressing one or multiple wounds 0 X - Medium Wound Dressing one or multiple wounds 2 15 []  - Large Wound Dressing one or multiple wounds 0 []  - Application of Medications - injection 0 INTERVENTIONS - Miscellaneous []  - External ear exam 0 []  - Specimen Collection (cultures, biopsies, blood, body fluids, etc.) 0 []  - Specimen(s) / Culture(s) sent or taken to Lab for analysis 0 []  - Patient Transfer (multiple staff / Civil Service fast streamer / Similar devices) 0 []  - Simple Staple / Suture removal (25 or less) 0 []  - Complex Staple / Suture removal (26 or more) 0 []  - Hypo / Hyperglycemic Management (close monitor of Blood Glucose) 0 []  - Ankle / Brachial Index (ABI) - do not check if billed separately 0 Has the patient been seen at the hospital within the last three years: Yes Total Score: 245 Level Of Care: New/Established - Level 5 Electronic Signature(s) Signed: 11/17/2019 6:02:31 PM By: Levan Hurst RN, BSN Entered By: Levan Hurst on 11/17/2019  13:42:41 -------------------------------------------------------------------------------- Encounter Discharge Information Details Patient Name: Date of Service: [name]. 11/17/2019 10:30 AM Medical Record [id number] Patient Account Number: [id number] Date of Birth/Sex: Treating RN: [birth year] (49 y.o. Marvis Repress Primary Care Valen Mascaro: [name] Other Clinician: Referring Daviana Haymaker: Treating Breyson Kelm/Extender:Robson, Finis Bud, Loni Muse in Treatment: 0 Encounter Discharge Information Items Discharge Condition: Stable Ambulatory Status: Wheelchair Discharge Destination: Home Transportation: Private Auto Accompanied By: self Schedule Follow-up Appointment: Yes Clinical Summary of Care: Patient Declined Electronic Signature(s) Signed: 11/17/2019 5:36:36 PM By: Kela Millin Entered By: Kela Millin on 11/17/2019 12:28:28 -------------------------------------------------------------------------------- Lower Extremity Assessment Details Patient Name: Date of Service: Mitchell [name], Mitchell [name] 11/17/2019 10:30 AM Medical Record [id number] Patient Account Number: [id number] Date of Birth/Sex: Treating RN: [birth year] (49 y.o. Oval Linsey Primary Care Reese Senk: [name] Other Clinician: Referring Carlis Blanchard: Treating Erma Joubert/Extender:Robson, Finis Bud, Loni Muse in Treatment: 0 Edema Assessment Assessed: [Left: No] [Right: No] E[Left: dema] [Right: :] Calf Left: Right: Point of Measurement: 44 cm From Medial Instep 41 cm 40 cm Ankle Left: Right: Point of Measurement: 10 cm From Medial Instep 25 cm 24 cm Electronic Signature(s) Signed: 11/18/2019 5:39:56 PM By: Carlene Coria RN Entered By: Carlene Coria on 11/17/2019 11:24:21 -------------------------------------------------------------------------------- Multi Wound Chart Details Patient Name: Date of Service: [name]. 11/17/2019 10:30 AM Medical Record  [id number] Patient Account Number: [id number] Date of Birth/Sex: Treating RN: 1971-03-23 (49 y.o. Janyth Contes Primary Care Rondall Radigan: [name] Other Clinician: Referring Ursula Dermody: Treating Clarity Ciszek/Extender:Robson, Finis Bud, Loni Muse in Treatment: 0 Vital Signs Height(in): 71 Pulse(bpm): 75 Weight(lbs): 298 Blood Pressure(mmHg): 159/88 Body Mass Index(BMI): 42 Temperature(F): 98 Respiratory 18 Rate(breaths/min): Photos: [11:No Photos] [12:No Photos] [13:No Photos] Wound Location: [11:Left, Dorsal Foot] [12:Left, Plantar Foot] [13:Left, Posterior Lower Leg] Wounding Event: [11:Thermal Burn] [12:Thermal Burn] [13:Thermal Burn] Primary Etiology: [11:3rd degree Burn] [12:3rd degree Burn] [13:3rd degree Burn] Comorbid History: [11:Glaucoma, Sleep Apnea, Glaucoma, Sleep  Apnea, Glaucoma, Sleep Apnea, Arrhythmia, Congestive Heart Failure, Hypertension, Heart Failure, Hypertension, Heart Failure, Hypertension, Myocardial Infarction, Type Myocardial Infarction,  Type Myocardial Infarction, Type II Diabetes] [12:Arrhythmia, Congestive II Diabetes] [13:Arrhythmia, Congestive II Diabetes] Date Acquired: [11:10/21/2019] [12:10/21/2019] [13:10/21/2019] Weeks of Treatment: [11:0] [12:0] [13:0] Wound Status: [11:Open] [12:Open] [13:Open] Measurements L x W x D 1.7x5x0.1 [12:1.9x0.7x0.1] [13:2.7x0.6x0.1] (cm) Area (cm) : [11:6.676] [12:1.045] [13:1.272] Volume (cm) : [11:0.668] [12:0.104] [13:0.127] % Reduction in Area: [11:N/A] [12:N/A] [13:58.60%] % Reduction in Volume: [11:N/A] [12:N/A] [13:58.60%] Classification: [11:Full Thickness Without Exposed Support Structures Exposed Support Structures Exposed Support Structures] [12:Full Thickness Without] [13:Full Thickness Without] Exudate Amount: [11:Medium] [12:Medium] [13:Medium] Exudate Type: [11:Serosanguineous] [12:Serosanguineous] [13:Serosanguineous] Exudate Color: [11:red, brown] [12:red, brown] [13:red,  brown] Wound Margin: [11:Flat and Intact] [12:N/A] [13:N/A] Granulation Amount: [11:Medium (34-66%)] [12:Medium (34-66%)] [13:Medium (34-66%)] Granulation Quality: [11:Pink, Pale] [12:Pink, Pale] [13:Pink, Pale] Necrotic Amount: [11:Medium (34-66%)] [12:Medium (34-66%)] [13:Medium (34-66%)] Exposed Structures: [11:Fat Layer (Subcutaneous Fat Layer (Subcutaneous Fascia: No Tissue) Exposed: Yes Fascia: No Tendon: No Muscle: No Joint: No Bone: No] [12:Tissue) Exposed: Yes Fascia: No Tendon: No Muscle: No Joint: No Bone: No] [13:Fat Layer (Subcutaneous Tissue)  Exposed: No Tendon: No Muscle: No Joint: No Bone: No] Epithelialization: [11:None 14] [12:None 15] [13:None 16] Photos: [11:No Photos] [12:No Photos] [13:No Photos] Wound Location: [11:Right Toe Great] [12:Right Toe Second] [13:Right Lower Leg] Wounding Event: [11:Thermal Burn] [12:Thermal Burn] [13:Thermal Burn] Primary Etiology: [11:3rd degree Burn] [12:3rd degree Burn] [13:3rd degree Burn] Comorbid History: [11:Glaucoma, Sleep Apnea, Glaucoma, Sleep Apnea, Glaucoma, Sleep Apnea, Arrhythmia, Congestive Heart Failure, Hypertension, Heart Failure, Hypertension, Heart Failure, Hypertension, Myocardial Infarction, Type Myocardial Infarction,  Type Myocardial Infarction, Type II Diabetes] [12:Arrhythmia, Congestive II Diabetes] [13:Arrhythmia, Congestive II Diabetes] Date Acquired: [11:10/21/2019] [12:10/21/2019] [13:10/21/2019] Weeks of Treatment: [11:0] [12:0] [13:0] Wound Status: [11:Open] [12:Open] [13:Open] Measurements L x W x D 2.3x1.7x0.1 [12:0.9x2.1x0.1] [13:1.8x7.6x0.1] (cm) Area (cm) : [11:3.071] [12:1.484] [13:10.744] Volume (cm) : [11:0.307] [12:0.148] [13:1.074] % Reduction in Area: [11:N/A] [12:N/A] [13:N/A] % Reduction in Volume: N/A [12:N/A] [13:N/A] Classification: [11:Full Thickness Without Exposed Support Structures Exposed Support Structures Exposed Support Structures] [12:Full Thickness Without] [13:Full Thickness  Without] Exudate Amount: [11:Medium] [12:Medium] [13:Medium] Exudate Type: [11:Serosanguineous] [12:Serosanguineous] [13:Serosanguineous] Exudate Color: [11:red, brown] [12:red, brown] [13:red, brown] Wound Margin: [11:N/A] [12:N/A] [13:N/A] Granulation Amount: [11:Medium (34-66%)] [12:Medium (34-66%)] [13:Medium (34-66%)] Granulation Quality: [11:N/A] [12:Pink, Pale] [13:Pink, Pale] Necrotic Amount: [11:Medium (34-66%)] [12:Medium (34-66%)] [13:Medium (34-66%)] Exposed Structures: [11:Fat Layer (Subcutaneous Fat Layer (Subcutaneous Fat Layer (Subcutaneous Tissue) Exposed: Yes Fascia: No Tendon: No Muscle: No Joint: No Bone: No] [12:Tissue) Exposed: Yes Fascia: No Tendon: No Muscle: No Joint: No Bone: No] [13:Tissue) Exposed: Yes  Fascia: No Tendon: No Muscle: No Joint: No Bone: No] Epithelialization: [11:None] [12:None 17 N/A] [13:None N/A] Photos: [11:No Photos] [12:N/A] [13:N/A] Wound Location: [11:Left, Lateral Upper Leg] [12:N/A] [13:N/A] Wounding Event: [11:Surgical Injury] [12:N/A] [13:N/A] Primary Etiology: [11:Open Surgical Wound] [12:N/A N/A] Comorbid History: [11:Glaucoma, Sleep Apnea, N/A Arrhythmia, Congestive Heart Failure, Hypertension, Myocardial Infarction, Type II Diabetes] [12:N/A] Date Acquired: [11:10/24/2019] [12:N/A N/A] Weeks of Treatment: [11:0] [12:N/A N/A] Wound Status: [11:Open] [12:N/A N/A] Measurements L x W x D 2x2x0.1 [12:N/A N/A] (cm) Area (cm) : [11:3.142] [12:N/A N/A] Volume (cm) : [11:0.314] [12:N/A N/A] % Reduction in Area: [11:N/A] [12:N/A N/A] % Reduction in Volume: N/A [12:N/A N/A] Classification: [11:Partial Thickness] [12:N/A N/A] Exudate Amount: [11:Medium] [12:N/A N/A] Exudate Type: [11:Serosanguineous] [12:N/A N/A] Exudate Color: [11:red, brown] [12:N/A N/A] Wound Margin: [11:Flat and Intact] [12:N/A N/A] Granulation Amount: [11:Large (67-100%)] [12:N/A N/A] Granulation Quality: [  11:Pink] [12:N/A N/A] Necrotic Amount: [11:None Present  (0%)] [12:N/A N/A] Exposed Structures: [11:Fascia: No Fat Layer (Subcutaneous Tissue) Exposed: No Tendon: No Muscle: No Joint: No Bone: No None] [12:N/A N/A N/A N/A] Treatment Notes Wound #11 (Left, Dorsal Foot) 1. Cleanse With Wound Cleanser 2. Periwound Care Skin Prep 3. Primary Dressing Applied Xeroform Gauze 4. Secondary Dressing ABD Pad Roll Gauze 5. Secured With Tape Notes netting Wound #12 (Left, Plantar Foot) 1. Cleanse With Wound Cleanser 2. Periwound Care Skin Prep 3. Primary Dressing Applied Xeroform Gauze 4. Secondary Dressing ABD Pad Roll Gauze 5. Secured With Tape Notes netting Wound #13 (Left, Posterior Lower Leg) 1. Cleanse With Wound Cleanser 2. Periwound Care Skin Prep 3. Primary Dressing Applied Xeroform Gauze 4. Secondary Dressing ABD Pad Dry Gauze 5. Secured With Tape Wound #14 (Right Toe Great) 1. Cleanse With Wound Cleanser 2. Periwound Care Skin Prep 3. Primary Dressing Applied Xeroform Gauze 4. Secondary Dressing ABD Pad Roll Gauze 5. Secured With Tape Notes netting Wound #15 (Right Toe Second) 1. Cleanse With Wound Cleanser 2. Periwound Care Skin Prep 3. Primary Dressing Applied Xeroform Gauze 4. Secondary Dressing ABD Pad Roll Gauze 5. Secured With Tape Notes netting Wound #16 (Right Lower Leg) 1. Cleanse With Wound Cleanser 2. Periwound Care Skin Prep 3. Primary Dressing Applied Xeroform Gauze 4. Secondary Dressing ABD Pad Roll Gauze 5. Secured With Tape Notes netting Wound #17 (Left, Lateral Upper Leg) 1. Cleanse With Wound Cleanser 2. Periwound Care Skin Prep 3. Primary Dressing Applied Xeroform Gauze 4. Secondary Dressing ABD Pad Roll Gauze 5. Secured With Tape Notes Government social research officer) Signed: 11/17/2019 5:34:04 PM By: Baltazar Najjar MD Signed: 11/17/2019 6:02:31 PM By: Zandra Abts RN, BSN Entered By: Baltazar Najjar on 11/17/2019  12:31:30 -------------------------------------------------------------------------------- Multi-Disciplinary Care Plan Details Patient Name: Date of Service: REZA, GRZESIK 11/17/2019 10:30 AM Medical Record [id number] Patient Account Number: [id number] Date of Birth/Sex: Treating RN: July 15, 1971 (49 y.o. Mitchell [name] Primary Care Holston Oyama: Abbe Amsterdam Other Clinician: Referring Ninette Cotta: Treating Jhace Fennell/Extender:Robson, Joesph July, Alexander Bergeron in Treatment: 0 Active Inactive Nutrition Nursing Diagnoses: Impaired glucose control: actual or potential Potential for alteratiion in Nutrition/Potential for imbalanced nutrition Goals: Patient/caregiver agrees to and verbalizes understanding of need to use nutritional supplements and/or vitamins as prescribed Date Initiated: 11/17/2019 Target Resolution Date: 12/19/2019 Goal Status: Active Patient/caregiver verbalizes understanding of need to maintain therapeutic glucose control per primary care physician Date Initiated: 11/17/2019 Target Resolution Date: 12/19/2019 Goal Status: Active Interventions: Assess HgA1c results as ordered upon admission and as needed Assess patient nutrition upon admission and as needed per policy Provide education on elevated blood sugars and impact on wound healing Provide education on nutrition Notes: Wound/Skin Impairment Nursing Diagnoses: Impaired tissue integrity Knowledge deficit related to ulceration/compromised skin integrity Goals: Patient/caregiver will verbalize understanding of skin care regimen Date Initiated: 11/17/2019 Target Resolution Date: 12/19/2019 Goal Status: Active Ulcer/skin breakdown will have a volume reduction of 30% by week 4 Date Initiated: 11/17/2019 Target Resolution Date: 12/19/2019 Goal Status: Active Interventions: Assess patient/caregiver ability to obtain necessary supplies Assess patient/caregiver ability to perform ulcer/skin care regimen  upon admission and as needed Assess ulceration(s) every visit Provide education on ulcer and skin care Notes: Electronic Signature(s) Signed: 11/17/2019 6:02:31 PM By: Zandra Abts RN, BSN Entered By: Zandra Abts on 11/17/2019 11:40:09 -------------------------------------------------------------------------------- Patient/Caregiver Education Details Patient Name: Date of Service: Mitchell [name] 4/19/2021andnbsp10:30 AM Medical Record [id number] Patient Account Number: [id number] Date of Birth/Gender: Treating RN: 1971/01/16 (49 y.o. Mitchell [name] Primary  Care Physician: Abbe Amsterdam Other Clinician: Referring Physician: Treating Physician/Extender:Robson, Joesph July, Alexander Bergeron in Treatment: 0 Education Assessment Education Provided To: Patient Education Topics Provided Elevated Blood Sugar/ Impact on Healing: Methods: Explain/Verbal Responses: State content correctly Nutrition: Methods: Explain/Verbal Responses: State content correctly Wound/Skin Impairment: Methods: Explain/Verbal Responses: State content correctly Electronic Signature(s) Signed: 11/17/2019 6:02:31 PM By: Zandra Abts RN, BSN Entered By: Zandra Abts on 11/17/2019 13:41:46 -------------------------------------------------------------------------------- Wound Assessment Details Patient Name: Date of Service: Mitchell [name] 11/17/2019 10:30 AM Medical Record [id number] Patient Account Number: [id number] Date of Birth/Sex: Treating RN: 04/15/1971 (49 y.o. Melonie [place] Primary Care Maveryk Renstrom: Abbe Amsterdam Other Clinician: Referring Adiva Boettner: Treating Mayzie Caughlin/Extender:Robson, Joesph July, Alexander Bergeron in Treatment: 0 Wound Status Wound Number: 11 Primary 3rd degree Burn Etiology: Wound Location: Left, Dorsal Foot Wound Open Wounding Event: Thermal Burn Status: Date Acquired: 10/21/2019 Comorbid Glaucoma, Sleep Apnea, Arrhythmia, Weeks Of Treatment:  0 History: Congestive Heart Failure, Hypertension, Clustered Wound: No Myocardial Infarction, Type II Diabetes Photos Photo Uploaded By: Benjaman Kindler on 11/19/2019 09:22:52 Wound Measurements Length: (cm) 1.7 % Reduc Width: (cm) 5 % Reduc Depth: (cm) 0.1 Epithel Area: (cm) 6.676 Tunnel Volume: (cm) 0.668 Underm Wound Description Classification: Full Thickness Without Exposed Support Foul O Structures Slough Wound Flat and Intact Margin: Exudate Medium Amount: Exudate Serosanguineous Type: Exudate red, brown Color: Wound Bed Granulation Amount: Medium (34-66%) Granulation Quality: Pink, Pale Fascia Necrotic Amount: Medium (34-66%) Fat Lay Necrotic Quality: Adherent Slough Tendon Muscle Joint E Bone Ex dor After Cleansing: No /Fibrino Yes Exposed Structure Exposed: No er (Subcutaneous Tissue) Exposed: Yes Exposed: No Exposed: No xposed: No posed: No tion in Area: tion in Volume: ialization: None ing: No ining: No Treatment Notes Wound #11 (Left, Dorsal Foot) 1. Cleanse With Wound Cleanser 2. Periwound Care Skin Prep 3. Primary Dressing Applied Xeroform Gauze 4. Secondary Dressing ABD Pad Roll Gauze 5. Secured With Tape Notes Government social research officer) Signed: 11/18/2019 5:39:56 PM By: Yevonne Pax RN Entered By: Yevonne Pax on 11/17/2019 11:30:26 -------------------------------------------------------------------------------- Wound Assessment Details Patient Name: Date of Service: Mitchell [name], Mitchell [name] 11/17/2019 10:30 AM Medical Record [id number] Patient Account Number: [id number] Date of Birth/Sex: Treating RN: 1971-01-28 (49 y.o. Mitchell [name]) Yevonne Pax Primary Care Ramsay Bognar: Abbe Amsterdam Other Clinician: Referring Aiyanah Kalama: Treating Keiland Pickering/Extender:Robson, Joesph July, Alexander Bergeron in Treatment: 0 Wound Status Wound Number: 12 Primary 3rd degree Burn Etiology: Wound Location: Left, Plantar Foot Wound Open Wounding Event:  Thermal Burn Status: Date Acquired: 10/21/2019 Comorbid Glaucoma, Sleep Apnea, Arrhythmia, Weeks Of Treatment: 0 History: Congestive Heart Failure, Hypertension, Clustered Wound: No Myocardial Infarction, Type II Diabetes Photos Photo Uploaded By: Benjaman Kindler on 11/19/2019 09:23:51 Wound Measurements Length: (cm) 1.9 % Reduc Width: (cm) 0.7 % Reduc Depth: (cm) 0.1 Epithel Area: (cm) 1.045 Tunnel Volume: (cm) 0.104 Underm Wound Description Classification: Full Thickness Without Exposed Support Foul O Structures Slough Exudate Medium Amount: Exudate Serosanguineous Type: Exudate red, brown Color: Wound Bed Granulation Amount: Medium (34-66%) Granulation Quality: Pink, Pale Fascia Necrotic Amount: Medium (34-66%) Fat Lay Necrotic Quality: Adherent Slough Tendon Muscle Joint E Bone Ex dor After Cleansing: No /Fibrino Yes Exposed Structure Exposed: No er (Subcutaneous Tissue) Exposed: Yes Exposed: No Exposed: No xposed: No posed: No tion in Area: tion in Volume: ialization: None ing: No ining: No Treatment Notes Wound #12 (Left, Plantar Foot) 1. Cleanse With Wound Cleanser 2. Periwound Care Skin Prep 3. Primary Dressing Applied Xeroform Gauze 4. Secondary Dressing ABD Pad Roll Gauze 5. Secured With Tape Notes Government social research officer) Signed:  11/18/2019 5:39:56 PM By: Yevonne Pax RN Entered By: Yevonne Pax on 11/17/2019 11:32:15 -------------------------------------------------------------------------------- Wound Assessment Details Patient Name: Date of Service: Mitchell [name], Mitchell [name] 11/17/2019 10:30 AM Medical Record [id number] Patient Account Number: [id number] Date of Birth/Sex: Treating RN: [birth year] (49 y.o. Mitchell [name]) Yevonne Pax Primary Care Alenna Russell: Abbe [place] Other Clinician: Referring Isiaah Cuervo: Treating Labrian Torregrossa/Extender:Robson, Joesph July, Alexander Bergeron in Treatment: 0 Wound Status Wound Number: 13 Primary 3rd  degree Burn Etiology: Wound Location: Left, Posterior Lower Leg Wound Open Wounding Event: Thermal Burn Status: Date Acquired: 10/21/2019 Comorbid Glaucoma, Sleep Apnea, Arrhythmia, Weeks Of Treatment: 0 History: Congestive Heart Failure, Hypertension, Clustered Wound: No Myocardial Infarction, Type II Diabetes Photos Photo Uploaded By: Benjaman Kindler on 11/19/2019 09:23:28 Wound Measurements Length: (cm) 2.7 % Reductio Width: (cm) 0.6 % Reductio Depth: (cm) 0.1 Epithelial Area: (cm) 1.272 Tunneling Volume: (cm) 0.127 Undermini Wound Description Classification: Full Thickness Without Exposed Support Foul O Structures Slough Exudate Medium Amount: Exudate Serosanguineous Type: Exudate red, brown Color: Wound Bed Granulation Amount: Medium (34-66%) Granulation Quality: Pink, Pale Fascia Necrotic Amount: Medium (34-66%) Fat Lay Necrotic Quality: Adherent Slough Tendon Muscle Joint E Bone Ex dor After Cleansing: No /Fibrino Yes Exposed Structure Exposed: No er (Subcutaneous Tissue) Exposed: No Exposed: No Exposed: No xposed: No posed: No n in Area: 58.6% n in Volume: 58.6% ization: None : No ng: No Treatment Notes Wound #13 (Left, Posterior Lower Leg) 1. Cleanse With Wound Cleanser 2. Periwound Care Skin Prep 3. Primary Dressing Applied Xeroform Gauze 4. Secondary Dressing ABD Pad Dry Gauze 5. Secured With Secretary/administrator) Signed: 11/18/2019 5:39:56 PM By: Yevonne Pax RN Entered By: Yevonne Pax on 11/17/2019 11:34:54 -------------------------------------------------------------------------------- Wound Assessment Details Patient Name: Date of Service: Mitchell [name], Mitchell [name] 11/17/2019 10:30 AM Medical Record [id number] Patient Account Number: [id number] Date of Birth/Sex: Treating RN: 02-20-1971 (49 y.o. Mitchell [name]) Yevonne Pax Primary Care Namiah Dunnavant: Abbe [place] Other Clinician: Referring Yordi Krager: Treating Azam Gervasi/Extender:Robson,  Joesph July, Alexander Bergeron in Treatment: 0 Wound Status Wound Number: 14 Primary 3rd degree Burn Etiology: Wound Location: Right Toe Great Wound Open Wounding Event: Thermal Burn Status: Date Acquired: 10/21/2019 Comorbid Glaucoma, Sleep Apnea, Arrhythmia, Weeks Of Treatment: 0 History: Congestive Heart Failure, Hypertension, Clustered Wound: No Clustered Wound: No Myocardial Infarction, Type II Diabetes Photos Photo Uploaded By: Benjaman Kindler on 11/19/2019 09:21:38 Wound Measurements Length: (cm) 2.3 % Reduct Width: (cm) 1.7 % Reduct Depth: (cm) 0.1 Epitheli Area: (cm) 3.071 Tunneli Volume: (cm) 0.307 Undermi Wound Description Classification: Full Thickness Without Exposed Support Foul Odo Structures Slough/F Exudate Medium Amount: Exudate Serosanguineous Type: Exudate red, brown Color: Wound Bed Granulation Amount: Medium (34-66%) Necrotic Amount: Medium (34-66%) Fascia E Necrotic Quality: Adherent Slough Fat Laye Tendon E Muscle E Joint Ex Bone Exp r After Cleansing: No ibrino Yes Exposed Structure xposed: No r (Subcutaneous Tissue) Exposed: Yes xposed: No xposed: No posed: No osed: No ion in Area: ion in Volume: alization: None ng: No ning: No Treatment Notes Wound #14 (Right Toe Great) 1. Cleanse With Wound Cleanser 2. Periwound Care Skin Prep 3. Primary Dressing Applied Xeroform Gauze 4. Secondary Dressing ABD Pad Roll Gauze 5. Secured With Tape Notes Government social research officer) Signed: 11/18/2019 5:39:56 PM By: Yevonne Pax RN Entered By: Yevonne Pax on 11/17/2019 11:36:18 -------------------------------------------------------------------------------- Wound Assessment Details Patient Name: Date of Service: Mitchell [name], Mitchell [name] 11/17/2019 10:30 AM Medical Record [id number] Patient Account Number: [id number] Date of Birth/Sex: Treating RN: 1970/08/31 (49 y.o. Mitchell [name]) Yevonne Pax Primary Care Keng Jewel: Abbe [place] Other Clinician: Referring Andyn Sales: Treating  Azaliah Carrero/Extender:Robson, Joesph July, Jessica Weeks in Treatment: 0 Wound Status Wound Number: 15 Primary 3rd degree Burn Etiology: Wound Location: Right Toe Second Wound Open Wounding Event: Thermal Burn Status: Date Acquired: 10/21/2019 Comorbid Glaucoma, Sleep Apnea, Arrhythmia, Weeks Of Treatment: 0 History: Congestive Heart Failure, Hypertension, Clustered Wound: No Myocardial Infarction, Type II Diabetes Photos Photo Uploaded By: Benjaman Kindler on 11/19/2019 09:21:39 Wound Measurements Length: (cm) 0.9 % Reduc Width: (cm) 2.1 % Reduc Depth: (cm) 0.1 Epithel Area: (cm) 1.484 Tunnel Volume: (cm) 0.148 Underm Wound Description Full Thickness Without Exposed Support Foul O Classification: Structures Slough Exudate Medium Amount: Exudate Serosanguineous Type: Exudate red, brown Color: Wound Bed Granulation Amount: Medium (34-66%) Granulation Quality: Pink, Pale Fascia Necrotic Amount: Medium (34-66%) Fat Lay Necrotic Quality: Adherent Slough Tendon Expo Muscle Expo Joint Expos Bone Expose dor After Cleansing: No /Fibrino Yes Exposed Structure Exposed: No er (Subcutaneous Tissue) Exposed: Yes sed: No sed: No ed: No d: No tion in Area: tion in Volume: ialization: None ing: No ining: No Treatment Notes Wound #15 (Right Toe Second) 1. Cleanse With Wound Cleanser 2. Periwound Care Skin Prep 3. Primary Dressing Applied Xeroform Gauze 4. Secondary Dressing ABD Pad Roll Gauze 5. Secured With Tape Notes Government social research officer) Signed: 11/18/2019 5:39:56 PM By: Yevonne Pax RN Entered By: Yevonne Pax on 11/17/2019 11:37:42 -------------------------------------------------------------------------------- Wound Assessment Details Patient Name: Date of Service: Mitchell [name], Mitchell [name] 11/17/2019 10:30 AM Medical Record [id number] Patient Account Number: [id number] Date of  Birth/Sex: Treating RN: 21-Aug-1970 (49 y.o. Mitchell [name]) Yevonne Pax Primary Care Jontrell Bushong: Abbe [place] Other Clinician: Referring Jamus Loving: Treating Christain Niznik/Extender:Robson, Joesph July, Alexander Bergeron in Treatment: 0 Wound Status Wound Number: 16 Primary 3rd degree Burn Etiology: Wound Location: Right Lower Leg Wound Open Wounding Event: Thermal Burn Status: Date Acquired: 10/21/2019 Comorbid Glaucoma, Sleep Apnea, Arrhythmia, Weeks Of Treatment: 0 History: Congestive Heart Failure, Hypertension, Clustered Wound: No Myocardial Infarction, Type II Diabetes Photos Photo Uploaded By: Benjaman Kindler on 11/19/2019 09:22:19 Wound Measurements Length: (cm) 1.8 % Reduct Width: (cm) 7.6 % Reduct Depth: (cm) 0.1 Epitheli Area: (cm) 10.744 Tunneli Volume: (cm) 1.074 Undermi Wound Description Classification: Full Thickness Without Exposed Support Foul Odo Structures Slough/F Exudate Medium Amount: Exudate Serosanguineous Type: Exudate red, brown Color: Wound Bed Granulation Amount: Medium (34-66%) Granulation Quality: Pink, Pale Fascia E Necrotic Amount: Medium (34-66%) Fat Laye Necrotic Quality: Adherent Slough Tendon E Muscle E Joint Ex Bone Exp r After Cleansing: No ibrino Yes Exposed Structure xposed: No r (Subcutaneous Tissue) Exposed: Yes xposed: No xposed: No posed: No osed: No ion in Area: ion in Volume: alization: None ng: No ning: No Treatment Notes Wound #16 (Right Lower Leg) 1. Cleanse With Wound Cleanser 2. Periwound Care Skin Prep 3. Primary Dressing Applied Xeroform Gauze 4. Secondary Dressing ABD Pad Roll Gauze 5. Secured With Tape Notes Government social research officer) Signed: 11/18/2019 5:39:56 PM By: Yevonne Pax RN Entered By: Yevonne Pax on 11/17/2019 11:38:57 -------------------------------------------------------------------------------- Wound Assessment Details Patient Name: Date of Service: Mitchell [name], Mitchell [name] 11/17/2019  10:30 AM Medical Record [id number] Patient Account Number: [id number] Date of Birth/Sex: Treating RN: 1971/07/03 (49 y.o. Mitchell [name] Primary Care Darra Rosa: Abbe [place] Other Clinician: Referring Olesya Wike: Treating Jodelle Fausto/Extender:Robson, Joesph July, Alexander Bergeron in Treatment: 0 Wound Status Wound Number: 17 Primary Open Surgical Wound Etiology: Wound Location: Left, Lateral Upper Leg Wound Open Wounding Event: Surgical Injury Status: Date Acquired: 10/24/2019 Comorbid Glaucoma, Sleep Apnea, Arrhythmia, Weeks Of Treatment: 0 History: Congestive Heart Failure, Hypertension, Clustered Wound: No Myocardial Infarction, Type II Diabetes Photos  Wound Measurements Length: (cm) 2 % Reduction in A Width: (cm) 2 % Reduction in V Depth: (cm) 0.1 Epithelializatio Area: (cm) 3.142 Tunneling: Volume: (cm) 0.314 Undermining: Wound Description Classification: Partial Thickness Foul Odor After Wound Margin: Flat and Intact Slough/Fibrino Exudate Amount: Medium Exudate Type: Serosanguineous Exudate Color: red, brown Wound Bed Granulation Amount: Large (67-100%) Granulation Quality: Pink Fascia Exposed: Necrotic Amount: None Present (0%) Fat Layer (Subcu Tendon Exposed: Muscle Exposed: Joint Exposed: Bone Exposed: Cleansing: No No Exposed Structure No taneous Tissue) Exposed: No No No No No rea: 0% olume: 0% n: None No No Treatment Notes Wound #17 (Left, Lateral Upper Leg) 1. Cleanse With Wound Cleanser 2. Periwound Care Skin Prep 3. Primary Dressing Applied Xeroform Gauze 4. Secondary Dressing ABD Pad Roll Gauze 5. Secured With Tape Notes Government social research officer) Signed: 11/19/2019 11:18:43 AM By: Zandra Abts RN, BSN Signed: 11/19/2019 12:23:53 PM By: Benjaman Kindler EMT/HBOT Previous Signature: 11/17/2019 6:02:31 PM Version By: Zandra Abts RN, BSN Entered By: Benjaman Kindler on 11/19/2019  09:24:53 -------------------------------------------------------------------------------- Vitals Details Patient Name: Date of Service: Mitchell [name], Mitchell [name] 11/17/2019 10:30 AM Medical Record [id number] Patient Account Number: [id number] Date of Birth/Sex: Treating RN: 10/28/1970 (49 y.o. Mitchell [name]) Yevonne Pax Primary Care Lorenia Hoston: Abbe [place] Other Clinician: Referring Dilpreet Faires: Treating Salil Raineri/Extender:Robson, Joesph July, Alexander Bergeron in Treatment: 0 Vital Signs Time Taken: 10:53 Temperature (F): 98 Height (in): 71 Pulse (bpm): 94 Source: Stated Respiratory Rate (breaths/min): 18 Weight (lbs): 298 Blood Pressure (mmHg): 159/88 Source: Stated Reference Range: 80 - 120 mg / dl Body Mass Index (BMI): 41.6 Electronic Signature(s) Signed: 11/18/2019 5:39:56 PM By: Yevonne Pax RN Entered By: Yevonne Pax on 11/17/2019 10:56:32

## 2019-11-18 NOTE — Progress Notes (Signed)
GURJOT, BRISCO ([id number]) Visit Report for 11/17/2019 Abuse/Suicide Risk Screen Details Patient Name: Date of Service: JANET, DECESARE 11/17/2019 10:30 AM Medical Record [id number] Patient Account Number: [id number] Date of Birth/Sex: Treating RN: [birth year] (49 y.o. [name]) Yevonne Pax Primary Care Kinlie Janice: Abbe [place] Other Clinician: Referring Kimani Hovis: Treating Garrick Midgley/Extender:Robson, Joesph July, Alexander Bergeron in Treatment: 0 Abuse/Suicide Risk Screen Items Answer ABUSE RISK SCREEN: Has anyone close to you tried to hurt or harm you recentlyo No Do you feel uncomfortable with anyone in your familyo No Has anyone forced you do things that you didnt want to doo No Electronic Signature(s) Signed: 11/18/2019 5:39:56 PM By: Yevonne Pax RN Entered By: Yevonne Pax on 11/17/2019 10:58:54 -------------------------------------------------------------------------------- Activities of Daily Living Details Patient Name: Date of Service: THEODIS, KINSEL 11/17/2019 10:30 AM Medical Record [id number] Patient Account Number: [id number] Date of Birth/Sex: Treating RN: March 02, 1971 (49 y.o. [name]) Yevonne Pax Primary Care Chelsea Nusz: Abbe [place] Other Clinician: Referring Randa Riss: Treating Aideen Fenster/Extender:Robson, Joesph July, Alexander Bergeron in Treatment: 0 Activities of Daily Living Items Answer Activities of Daily Living (Please select one for each item) Drive Automobile Not Able Take Medications Completely Able Use Telephone Completely Able Care for Appearance Need Assistance Use Toilet Need Assistance Bath / Shower Need Assistance Dress Self Need Assistance Feed Self Completely Able Walk Not Able Get In / Out Bed Need Assistance Housework Not Able Prepare Meals Not Able Handle Money Completely Able Shop for Self Not Able Electronic Signature(s) Signed: 11/18/2019 5:39:56 PM By: Yevonne Pax RN Entered By: Yevonne Pax on 11/17/2019  10:59:45 -------------------------------------------------------------------------------- Education Screening Details Patient Name: Date of Service: [name] 11/17/2019 10:30 AM Medical Record [id number] Patient Account Number: [id number] Date of Birth/Sex: Treating RN: Jan 06, 1971 (49 y.o. Melonie [place] Primary Care Ronnetta Currington: Abbe [place] Other Clinician: Referring Bracy Pepper: Treating Eldean Klatt/Extender:Robson, Joesph July, Alexander Bergeron in Treatment: 0 Primary Learner Assessed: Patient Learning Preferences/Education Level/Primary Language Learning Preference: Explanation Highest Education Level: High School Preferred Language: English Cognitive Barrier Language Barrier: No Translator Needed: No Memory Deficit: No Emotional Barrier: No Cultural/Religious Beliefs Affecting Medical Care: No Physical Barrier Impaired Vision: Yes Glasses Impaired Hearing: No Decreased Hand dexterity: No Knowledge/Comprehension Knowledge Level: Medium Comprehension Level: High Ability to understand written High instructions: Ability to understand verbal High instructions: Motivation Anxiety Level: Calm Cooperation: Cooperative Education Importance: Acknowledges Need Interest in Health Problems: Asks Questions Perception: Coherent Willingness to Engage in Self- High Management Activities: Readiness to Engage in Self- High Management Activities: Electronic Signature(s) Signed: 11/18/2019 5:39:56 PM By: Yevonne Pax RN Entered By: Yevonne Pax on 11/17/2019 11:00:16 -------------------------------------------------------------------------------- Fall Risk Assessment Details Patient Name: Date of Service: [name] 11/17/2019 10:30 AM Medical Record [id number] Patient Account Number: [id number] Date of Birth/Sex: Treating RN: 1971/02/07 (49 y.o. [name]) Yevonne Pax Primary Care Joscelynn Brutus: Abbe [place] Other Clinician: Referring Deysi Soldo: Treating  Jocelyn Lowery/Extender:Robson, Joesph July, Alexander Bergeron in Treatment: 0 Fall Risk Assessment Items Have you had 2 or more falls in the last 12 monthso 0 No Have you had any fall that resulted in injury in the last 12 monthso 0 No FALLS RISK SCREEN History of falling - immediate or within 3 months 0 No Secondary diagnosis (Do you have 2 or more medical diagnoseso) 0 No Ambulatory aid None/bed rest/wheelchair/nurse 0 No Crutches/cane/walker 0 No Furniture 0 No Intravenous therapy Access/Saline/Heparin Lock 0 No Weak (short steps with or without shuffle, stooped but able to lift head 0 No while walking, may seek support from furniture) Impaired (short steps with shuffle, may have  difficulty arising from chair, 0 No head down, impaired balance) Mental Status Oriented to own ability 0 No Overestimates or forgets limitations 0 No Risk Level: Low Risk Score: 0 Electronic Signature(s) Signed: 11/18/2019 5:39:56 PM By: Carlene Coria RN Entered By: Carlene Coria on 11/17/2019 11:00:23 -------------------------------------------------------------------------------- Foot Assessment Details Patient Name: Date of Service: ELIE, LEPPO 11/17/2019 10:30 AM Medical Record [id number] Patient Account Number: [id number] Date of Birth/Sex: Treating RN: 15-May-1971 (49 y.o. Jerilynn Mages) Carlene Coria Primary Care Anayiah Howden: [name] Other Clinician: Referring Drayk Humbarger: Treating Ane Conerly/Extender:Robson, Finis Bud, Loni Muse in Treatment: 0 Foot Assessment Items Site Locations + = Sensation present, - = Sensation absent, C = Callus, U = Ulcer R = Redness, W = Warmth, M = Maceration, PU = Pre-ulcerative lesion F = Fissure, S = Swelling, D = Dryness Assessment Right: Left: Other Deformity: No No Prior Foot Ulcer: No No Prior Amputation: No No Charcot Joint: No No Ambulatory Status: Ambulatory Without Help Gait: Steady Electronic Signature(s) Signed: 11/18/2019 5:39:56 PM By:  Carlene Coria RN Entered By: Carlene Coria on 11/17/2019 11:22:49 -------------------------------------------------------------------------------- Nutrition Risk Screening Details Patient Name: Date of Service: DAIVON, RAYOS 11/17/2019 10:30 AM Medical Record [id number] Patient Account Number: [id number] Date of Birth/Sex: Treating RN: 1971-04-15 (49 y.o. Jerilynn Mages) Carlene Coria Primary Care Cardell Rachel: [name] Other Clinician: Referring Lataunya Ruud: Treating Sarah-Jane Nazario/Extender:Robson, Finis Bud, Loni Muse in Treatment: 0 Height (in): 71 Weight (lbs): 298 Body Mass Index (BMI): 41.6 Nutrition Risk Screening Items Score Screening NUTRITION RISK SCREEN: I have an illness or condition that made me change the kind and/or 0 No amount of food I eat I eat fewer than two meals per day 0 No I eat few fruits and vegetables, or milk products 0 No I have three or more drinks of beer, liquor or wine almost every day 0 No I have tooth or mouth problems that make it hard for me to eat 0 No I don't always have enough money to buy the food I need 0 No I eat alone most of the time 0 No I take three or more different prescribed or over-the-counter drugs a day 1 Yes 0 No Without wanting to, I have lost or gained 10 pounds in the last six months I am not always physically able to shop, cook and/or feed myself 2 Yes Nutrition Protocols Good Risk Protocol Provide education on Moderate Risk Protocol 0 nutrition High Risk Proctocol Risk Level: Moderate Risk Score: 3 Electronic Signature(s) Signed: 11/18/2019 5:39:56 PM By: Carlene Coria RN Entered By: Carlene Coria on 11/17/2019 11:00:46
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
## 2019-11-20 ENCOUNTER — Telehealth: Payer: Self-pay | Admitting: Family Medicine

## 2019-11-20 NOTE — Telephone Encounter (Signed)
Patient notified and states he is coming to pick them up

## 2019-11-20 NOTE — Telephone Encounter (Signed)
Done, back to you.

## 2019-11-20 NOTE — Telephone Encounter (Signed)
Pt dropped off document to be filled out by provider ( 2 pages - Disability Parking Placard -pt is asking for two since pt has 2 vehicles) Pt would like to be called at [facility] [phone]. Document put at front office tray under providers name.

## 2019-11-20 NOTE — Telephone Encounter (Signed)
Received forms have placed in providers folder to be signed.

## 2019-11-21 ENCOUNTER — Telehealth: Payer: Self-pay | Admitting: Family Medicine

## 2019-11-21 NOTE — Telephone Encounter (Signed)
CallerJerrye [name] Call Back # [phone]  Subject: Omitted Appointments   Patient had a schedule appt for  11/18/2019,omitted , patient at wound care  11/19/2019, omitted, patient out of town

## 2019-11-21 NOTE — Telephone Encounter (Signed)
fyi

## 2019-11-24 ENCOUNTER — Other Ambulatory Visit: Payer: Self-pay

## 2019-11-24 ENCOUNTER — Encounter (HOSPITAL_BASED_OUTPATIENT_CLINIC_OR_DEPARTMENT_OTHER): Payer: No Typology Code available for payment source | Admitting: Internal Medicine

## 2019-11-24 DIAGNOSIS — E11621 Type 2 diabetes mellitus with foot ulcer: Secondary | ICD-10-CM | POA: Diagnosis not present

## 2019-11-24 NOTE — Progress Notes (Signed)
JACQUES, POPKE ([id number]) Visit Report for 11/24/2019 Debridement Details Patient Name: Date of Service: LEONHARD, DOWIE 11/24/2019 1:30 PM Medical Record [id number] Patient Account Number: [id number] Date of Birth/Sex: Treating RN: [birth year] (49 y.o. [name] Primary Care Provider: Abbe Amsterdam Other Clinician: Referring Provider: Treating Provider/Extender:Shaynna Husby, Joesph July, Alexander Bergeron in Treatment: 1 Debridement Performed for Wound #11 Left,Dorsal Foot Assessment: Performed By: Physician Maxwell Caul., MD Debridement Type: Debridement Level of Consciousness (Pre- Awake and Alert procedure): Pre-procedure Verification/Time Out Taken: Yes - 14:32 Start Time: 14:32 Total Area Debrided (L x W): 1.5 (cm) x 4.3 (cm) = 6.45 (cm) Tissue and other material Viable, Non-Viable, Slough, Subcutaneous, Slough debrided: Level: Skin/Subcutaneous Tissue Debridement Description: Excisional Instrument: Curette Bleeding: Minimum Hemostasis Achieved: Pressure End Time: 14:34 Procedural Pain: 0 Post Procedural Pain: 0 Response to Treatment: Procedure was tolerated well Level of Consciousness Awake and Alert (Post-procedure): Post Debridement Measurements of Total Wound Length: (cm) 1.5 Width: (cm) 4.3 Depth: (cm) 0.1 Volume: (cm) 0.507 Character of Wound/Ulcer Post Improved Debridement: Post Procedure Diagnosis Same as Pre-procedure Electronic Signature(s) Signed: 11/24/2019 5:33:33 PM By: Zandra Abts RN, BSN Signed: 11/24/2019 5:40:04 PM By: Baltazar Najjar MD Entered By: Baltazar Najjar on 11/24/2019 16:34:55 -------------------------------------------------------------------------------- HPI Details Patient Name: Date of Service: [name] 11/24/2019 1:30 PM Medical Record [id number] Patient Account Number: [id number] Date of Birth/Sex: Treating RN: [birth year] (49 y.o. [name] Primary Care Provider: Abbe Amsterdam Other Clinician: Referring Provider: Treating Provider/Extender:Tamella Tuccillo, Joesph July, Alexander Bergeron in Treatment: 1 History of Present Illness Location: right fifth metatarsal head on the plantar aspect Quality: Patient reports No Pain. Severity: Patient states wound(s) are getting worse. Duration: Patient has had the wound for < 4 weeks prior to presenting for treatment Context: The wound would happen gradually Modifying Factors: Patient wound(s)/ulcer(s) are worsening due to :unable to get good diabetic shoes and has not seen the podiatrist who Dr. Leanord Hawking referred to him 4 months ago Associated Signs and Symptoms: Patient reports having increase discharge. HPI Description: This 49 year old gentleman known to our wound center for the last 2-1/2 years now returns with a recurrent large callus and ulceration under the right fifth metatarsal head which has been draining fluid for at least 4 weeks He is a diabetic with some element of neuropathy but no evidence of peripheral arterial disease and has not had any significant vascular work done in the past. his medical history significant for diabetes mellitus, hypertension, obesity, stroke 3, schizophrenia, nonischemic cardiomyopathy, hyperlipidemia, chronic systolic heart failure.his ejection fraction was about 20% and his diabetes showed hyperglycemia. most recent hemoglobin A1c was 10.3%. he has not been a smoker. patient has not got diabetic shoes nor has he seen the podiatrist, Dr. Leanord Hawking and referred to him 4 months ago. he says his last hemoglobin A1c done most recently was over 12% 06/28/17; again a recurrent wound on the right fifth plantar metatarsal head. Comes in today with callus over the surface. Using a #5 curet he remove this to find a small linear open wound not a healed surface. When he left here several months ago I had recommended consideration of a podiatry consult either for custom-made footwear or consideration  of possible surgery on the subluxed bone in this area. I don't think either was taken care of. 07/06/17; the patient is healed over. For one reason or another he has not managed to get diabetic shoes or custom inserts. He has been contacted by podiatry, I have referred him there to consider  how to adequately offload a subluxed fifth metatarsal head either with offloading devices are shoes or perhaps even with surgery. As usual he heals very quickly if the pressure is offloaded. On this time one week of a total contact cast Of note he would not be a good surgical candidate or at least for general anesthesia. He has a severe cardiomyopathy with an apparent ejection fraction of 20%. He also has poorly controlled type 2 diabetes ====== Old notes: This patient is a type II diabetic on insulin. As far as I can tell he does not have diabetic neuropathy or PAD. He states that 2 weeks ago he noted an ulcer on his right plantar foot. He went to the store but the hydrogen peroxide alcohol and he's been dressing this at home. He has no prior wound history. He has not been systemically unwell.he was seen in urgent care on 6/18. An x-ray suggested an abscess in the soft tissues but no erosive change or bony destruction he was prescribed clindamycin but I don't see that a culture was done. Looking over his problem list he has a history of chronic systolic heart failure with an EF of 20-25% listed as a nonischemic cardiomyopathy. He will not be a candidate for HBO 01/29/15; the wound over his fifth metatarsal head is no better or no worse. There is still an open area that probes to his fifth metatarsal head. An MRI is booked for next Friday. His culture last week grew pansensitive Escherichia coli. His clindamycin has been stopped and he is now on ciprofloxacin. 02/05/15; the wound over his right fifth metatarsal head appears to be improved. Apparently he went to the ER concerned that they have loss over alginate  packing in the wound. He has had his MRI done this shows no evidence of osteomyelitis it does show possible inflammation of the underlying pressure area probably consistent with infection. 02/12/15; the wound over his right fifth metatarsal head continues to improve. He has completed his ciprofloxacin. He is still complaining of wound pain. I suspect he has neuropathy. His MRI did not suggest any evidence of osteomyelitis. 02/18/15; the wound over his right fifth metatarsal head is improved. Debridement with a #15 blade to remove surface slough insert for circumferential callus. the tissue continues to look improved and healthy. 02/26/15. The wound has considerably improved. No debridement is required. The base of this appears to be healthy. 03/19/15 the wound over the right fifth metatarsal head has totally resolved. However I think the area will continue to be an area at risk of. There is not much in the way of subcutaneous tissue and the skn is mostly over his right fifth metatarsal head. I've advised continued offloading of this area using felt with insoles in issues. His primary doctor manages his diabetes and I've encouraged him to ask about ordering diabetic foot wear if his insurance will cover it 05/06/15; the patient returns today with with a wound over the right fifth metatarsal head plantar aspect that. 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 His foot is wet the pressure is prolonged. His daughter noted a wound on his foot today and he is come in for an achievement. He is a diabetic with possible diabetic neuropathy. He doesn't have macrovascular issues. He required a total contact cast for at least a month last time to get closure of this wound. 05/14/15; the wound over the right fifth metatarsal head required a surgical debridement. After this it cleans up quite nicely and is  fairly superficial. There is nothing obviously infected here but the patient is complaining of pain on  the lateral aspect of the wound and is quite tender. 05/21/15 the wound over the right fifth metatarsal head requires a surgical debridement. Post debridement the wound looks quite stable and is fairly superficial although we had the same outcome last week there is no obvious infection here no palpable tenderness. The patient is back at work 06/03/15; the wound is over the right fifth metatarsal head. This required surgical debridement. The wound appears to be stable however I think there is too much pressure here. 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 There is no clear evidence of infection. this patient had an MRI in July 2016 that did not show evidence of soft tissue abscess or osteomyelitis. We managed to heal him out in a total contact cast by and large.we have reordered another x-ray of the bone area given the extent of this wound and the underlying debridement. However I continue to think that this is inadequate offloading 06/11/15 the wound is over the plantar right fifth metatarsal head no debridement was required. He states he is keeping the pressure off this. The base of the wound looks healthy. There is still doubt that this 06/18/15 patient arrived with a linear wound over the plantar right fifth metatarsal head however there was significant undermining. I therefore did a surgical debridement to fully expose the wound 07/02/15; once again the patient arrived with a ragged nonviable surface with a linear wound between. Although this was debridement surprisingly underneath the base of this wound actually looks fairly stable with healthy-looking tissue 07/09/15; the patient arrives today having the wound in almost the same situation as usual. He requires a surgical debridement of callus skin and nonviable subcutaneous tissue. Once again the base of this appears to be healthy. 07/23/15; the patient arrives with circumferential callus  and nonviable subcutaneous tissue around the wound on his plantar right fifth metatarsal head. He requires surgical debridement of callus skin and nonviable subcutaneous tissue. This is roughly in the same status last time. He is going to require a total contact cast, he is simply not able to a float this in any other manner. We have previously healed him with a total contact cast 07/30/15; the patient arrives with a miraculous improvement in the condition of the wound over the right fifth metatarsal head this is fully epithelialized and with application of a total contact cast this week should be healed next week 08/06/15; patient had his total contact cast changed without incident his wound looks almost totally healed. We asked him to bring in issue next week 08/13/15; as predicted the area has totally healed and is totally epithelialized. 09/02/15 the patient returns today having a recurrence in the same area plantar surface of his right fifth metatarsal head. This is surrounded by callus. He had not yet obtained diabetic footwear although he had been to be fitted. He simply saw blood on the floor when he left the shower last night. He states his wife and daughter were keeping an eye in his feet and there was nothing before that. 09/09/15; once again underwent total contact cast the wound is basically down to half the overall circumference as last time. Invasive this appears healthy there is no undermining. He tells me that he was measured for diabetic shoes of course they're close to $300, he seems to have difficulty with this  as [place] healthcare, I'm not sure whether they will cover any part of this. Wound measurements down to 0.3 x 0.3 x 0.2 cm 09/16/15 this patient has a recurrent diabetic foot ulcer over his right fifth metatarsal head. The metatarsal head itself is subluxed. He has not yet obtained his diabetic footwear. MRI does not show osteomyelitis in July/16. He does not have a  macrovascular issue. He heals easily and total contact casting 12/30/15 READMIT; the patient is back again with the wound over the right fifth metatarsal head. The metatarsal head itself is known to be subluxed. He apparently had a co-pay for diabetic shoes in the high $200 which she could not afford. Apparently the recurrence has been over the last 3 weeks with an open area with a large amount of circumferential callus and necrotic subcutaneous tissue. In the past this is been easily fixed with debridement and a total contact cast 01/07/16; the area over the fifth metatarsal head on the right requires debridement of surface slough and circumferential callus. There is no infection here is culture is negative. In the past this is been easily fixed with debridement and a total contact cast. He has never been able to afford a diabetic footwear prescriptions 01/21/16; area over the fifth metatarsal head on the record on the right is unchanged from last time. This is a bit disappointing. There is no infection. Some circumferential callus that was removed. Will change the dressing from silver alginate to Silver [employer] and and replaced the total contact cast 01/27/16 the area over the fifth metatarsal head is much smaller this week. No infection. We have been using [employer] and under a total contact cast 02/03/16; the area over the fifth metatarsal head continues to be smaller, this week 0.2 x 0.2 x 0.2 we have been using [employer] and under a total contact cast 02/09/16; the area over the fifth metatarsal heads is resolved. I removed some surface eschar. There is no open wound here. I believe there is some subluxation of the fifth metatarsal head. The patient has been reluctant to spend the money necessary to get diabetic footwear. READMISSION 04/20/16; this is a patient I had on 2 different occasions in this clinic both for diabetic ulcers over his right plantar fifth metatarsal head. He does not  have known PAD or diabetic neuropathy. He tells me that greater than 3 weeks ago he was finally able to get his diabetic shoes. Almost that same day he got the shoe wet and that night he had a large blister over the surface of the foot in the same region as his previous wounds. He went to the ER on 04/06/16 the blister was excised. He was given a prescription for Keflex. The patient states that no x-ray was done. It appears that he is just been using wet-to-dry dressings on this. His history is that he requires total contact casting usually to heal these areas even when he is offloaded and are other forms of offloading footwear in the clinic. His diabetes is probably not well controlled in looking through [place] link there is nothing particularly helpful. MRI of the foot in 2016 no osteo. no recent xrays or vasc studies. no recent hgbA1c 04/28/16; continues to have a fairly large wound over the lateral aspect of his right fifth metatarsal head. Wound is debrided with a curet, nonviable skin removed with pickups and scissors below the wound and towards the plantar fourth and fifth toes. The area feels somewhat boggy but there  is no tenderness or warmth. No drainage is noted. He is going to need a plain x-ray. 10/6 last 17; wound dimensions are down somewhat hyper granulation present. X-ray from last week showed soft tissue ulceration lateral to the distal fifth metatarsal. There was no evidence of erosive change or bony destruction. There was some suggestion of erosive arthropathy in the first and second MTP joints. 05/12/16; wound debrided of surface slough and hyper granulation. No evidence of surrounding infection. Wound appears improved at the right fifth metatarsal head 05/19/16; some mild hyper granulation and drainage but no need for debridement. No evidence of surrounding infection. Tissue improved at the right fifth metatarsal head 05/26/16; patient using Hydrofera Blue total contact  cast. As usual for this patient the wound looks excellent 06/01/16; patient continues Hydrofera Blue total contact cast. As usual for this patient is wound looks excellent and is progressing gradually towards closure. 06/08/16; patient continues Hydrofera Blue and a total contact cast. He tells me that his diabetic shoes were lost at the time this wound performed because they were so can wet. He is also unable to afford anything out of pocket for shoes. The provider he is gone to see is told and that they are not in network with his insurance [employer] 06/16/16; patient was hospitalized from 11/12 through 11/14 with congestive heart failure. Among other things he was found to have a hemoglobin A1c of 12.1. He arrived today had his total contact cast removed. Everything is epithelialized small open area on the lateral aspect of the fifth metatarsal head on the right 06/30/16; the area on the fifth metatarsal head on the right. This is totally epithelialized and closed. This follows a similar pattern on his previous admissions to this clinic. He still does not have diabetic footwear but understands the importance of offloading this area. READMISSION 08/24/16 the patient just got out of hospital this morning rate he was admitted for congestive heart failure he was diuresed. He states over the last week or 2 he is noted callus over the right fifth metatarsal head which is the area is recurrent wounds. He comes in today for [place] to check this. He has not obtained diabetic shoes but was wearing a boot with felt offloading in this area. He has not had a recent imaging study although I'll need to look through his recent hospitalization. He has not had formal vascular studies recently that I can see. I did a plain x-ray last time he was here in October that did not show osteomyelitis. He has very poorly controlled type 2 diabetes. He also has a subluxed metatarsal head 09/01/16; 7 the patient came last  week with a recurrence in the area of his right fifth metatarsal head. This is been the site of recurrent wounds. My thinking last week is that he was likely going to need an MRI. He is a poorly controlled type II diabetic and has a subluxed metatarsal head which contributes to difficulty offloading 09/08/16; patient had what appeared to be a small draining area over the right fifth metatarsal head. Her intake nurse correctly pointed out that this looked like a blister however with purulent drainage. He was not complaining of pain. He been recently traveling in [place] 09/15/16; the area over the right fifth metatarsal head is fully epithelialized. He had recently been traveling in [place] I think developed a friction injury over this area and subsequently a open wound. This was superficial and not as problematic as other  wounds this man has had in the same area. 12/07/16 READMISSION Patient comes in today out of concern for again areas of skin thickening and question ulceration on the plantar aspect of his right fifth metatarsal head. We have been through this several times before and for one reason or another he has never managed to get offloading diabetic shoes. He arrives today and new balance shoes with no insole stating that he forgot but the insole in. In any case he states in the last week his wife noted that he had discoloration and thickened skin over this area and he became concerned and he is here for our review. He tells me works nights at the [employer] in [place]. He is not on his feet however. He has type 2 diabetes with diabetic neuropathy has a prior history of a subluxed metatarsal head #5 12/18/16; patient arrives today with again thick callus over his right fifth metatarsal head. Using a #5 curet I remove this. There is nothing open here. This is simply unrelieved pressure. He has never been able to get custom-made shoes. I referred him to podiatry to help with  the latter and also to give [place] a suggestion whether they felt that he surgery on the fifth metatarsal head would be helpful in order to prevent wounds down the road 02/15/17 READMISSION Patient comes in today out of concern for a week's worth of deteriorating condition over the same right fifth plantar had that we've had difficulty with in the past. He is a type II diabetic with diabetic neuropathy but does not have a history of PAD. I had previously suggested diabetic footwear with custom inserts on multiple occasions and last time he was here I asked that he see podiatry to look at subluxed right fifth metatarsal head to see if surgery in this area might help with recurrent ulcerations. He states he has not have an appointment because they have not called him back. 03/19/17; the patient comes in today with no open wound over the right fifth metatarsal head. He has been using a foam-based dressing that he is purchasing at a medical supply store. He has not received diabetic shoes but to be fair to him there is apparently been difficulties getting a store to supplied easing [place]. I'll need to look into this issue. He had a paronychia I on his left hand saw urgent care and was given antibiotics yesterday ====== READMISSION 03/26/18 This patient is a now 49 year old diabetic man who is had recurrent problems with wounds on the right fifth metatarsal head. Last in this clinic I believe in 2018. We have always recommended that he get diabetic custom-made footwear however he has never managed to do so. Nevertheless he tells me that until 2 weeks ago this area was still intact when he noticed a open wound on the same side on the plantar aspect of the right fifth metatarsal head. There is no particular additional feature. He has diabetic peripheral neuropathy and is reasonably insensate. He saw his primary doctor and was given a course of amoxicillin which he just finished. I believe is being using  an over-the-counter antibiotic ointment. ABI in this clinic was 1.0. He does not have a known history of PAD 04/02/18; culture I did last week showed abundant Escherichia coli had abundant Enterobacter. We use silver alginate last week not silver collagen as I said. I put him on ciprofloxacin 500 mg by mouth twice a day for 10 days starting today. Plain x-ray was negative  for osteomyelitis., 04/08/18; he continues on the antibiotics that I put him on. Plain x-ray was negative. He arrives today with his wound looking a little larger although that probably is a result of debridement I did last time. Surface of it looks viable although a little dry. We've been using silver alginate 04/15/18; total contact cast but on last week. He arrives today with a smaller opening wound. Using silver alginate 04/22/2018; total contact cast reapplied this week. Wound is smaller. Using silver alginate on the wound bed. 04/29/2018; the patient arrives with the area on the right plantar fifth metatarsal head fully epithelialized and healed. He has is foot wear with the inserts. I have advised him to keep this area padded as well. He has nothing but skin over bone and has had recurrent problems in this area. As usual he heals well with a total contact cast. I have written instructions that he will be able to go back to work without restrictions on 05/01/2018 05/21/2018; return to clinic This is a patient I discharged less than a month ago. He has recurrent wounds on the right plantar fifth metatarsal head he was fully epithelialized and healed he tells me that 4 days later his daughter was pulling off some padding or perhaps felt off this area and tore off some skin. Since then he has had an open wound. He saw his primary physician 10 days ago and I think was giving 2 weeks of Augmentin and he has another 3 or 4 days of that. He saw Dr. Romero Belling who manages his diabetes and was given a prescription for diabetic shoes at  [facility] but he still has not managed to get there. He has not been systemically unwell. 05/28/2018 MRI of the foot showed osteomyelitis under the wound on the fifth metatarsal head and neck with erosions compatible with osteomyelitis. Culture of the deep area in the lateral part of the wound bed showed Enterobacter, Staphylococcus aureus and Klebsiella. The Staphylococcus is methicillin sensitive. 06/04/2018; the patient continues on Septra and metronidazole. Today he has exposed bone in the middle of this wound. It would not be impossible to get a bone sample here. I note that he tells me he has an appointment with Dr. Logan Bores of podiatry on the 18th. He has not yet heard from infectious disease. 06/11/2018; the patient will continue on Septra he is completed the metronidazole. He does not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

## 2019-11-25 ENCOUNTER — Other Ambulatory Visit: Payer: Self-pay

## 2019-11-25 NOTE — Progress Notes (Signed)
Mitchell [name], Mitchell [name] ([id number]) Visit Report for 11/24/2019 Arrival Information Details Patient Name: Date of Service: Mitchell [name], Mitchell [name] 11/24/2019 1:30 PM Medical Record [id number] Patient Account Number: [id number] Date of Birth/Sex: Treating RN: 06-Oct-1970 (49 y.o. Mitchell [name] Primary Care Paticia Moster: Abbe [place] Other Clinician: Referring Lyndel Dancel: Treating Soo Steelman/Extender:Robson, Joesph July, Alexander Bergeron in Treatment: 1 Visit Information History Since Last Visit Added or deleted any medications: No Patient Arrived: Wheel Chair Any new allergies or adverse reactions: No Arrival Time: 13:54 Had a fall or experienced change in No Accompanied By: family activities of daily living that may affect member risk of falls: Transfer Assistance: None Signs or symptoms of abuse/neglect since last visito No Patient Identification Verified: Yes Hospitalized since last visit: No Secondary Verification Process Completed: Yes Implantable device outside of the clinic excluding No Patient Requires Transmission-Based No cellular tissue based products placed in the center Precautions: since last visit: Patient Has Alerts: No Has Dressing in Place as Prescribed: Yes Pain Present Now: No Electronic Signature(s) Signed: 11/25/2019 5:26:58 PM By: Cherylin Mylar Entered By: Cherylin Mylar on 11/24/2019 14:30:47 -------------------------------------------------------------------------------- Encounter Discharge Information Details Patient Name: Date of Service: Mitchell [name] 11/24/2019 1:30 PM Medical Record [id number] Patient Account Number: [id number] Date of Birth/Sex: Treating RN: 1970/08/15 (49 y.o. Mitchell [name] Primary Care Felecia Stanfill: Abbe [place] Other Clinician: Referring Kupono Marling: Treating Ronnell Makarewicz/Extender:Robson, Joesph July, Alexander Bergeron in Treatment: 1 Encounter Discharge Information Items Post Procedure Vitals Discharge  Condition: Stable Temperature (F): 98.6 Ambulatory Status: Wheelchair Pulse (bpm): 81 Discharge Destination: Home Respiratory Rate (breaths/min): 19 Transportation: Private Auto Blood Pressure (mmHg): 139/73 Accompanied By: family member Schedule Follow-up Appointment: Yes Clinical Summary of Care: Patient Declined Electronic Signature(s) Signed: 11/25/2019 5:26:58 PM By: Cherylin Mylar Entered By: Cherylin Mylar on 11/24/2019 14:54:05 -------------------------------------------------------------------------------- Lower Extremity Assessment Details Patient Name: Date of Service: Mitchell [name] 11/24/2019 1:30 PM Medical Record [id number] Patient Account Number: [id number] Date of Birth/Sex: Treating RN: 1971/04/21 (49 y.o. Mitchell [name] Primary Care Raiquan Chandler: Abbe [place] Other Clinician: Referring Hiliary Osorto: Treating Briceida Rasberry/Extender:Robson, Joesph July, Alexander Bergeron in Treatment: 1 Edema Assessment Assessed: [Left: No] [[name]: No] E[Left: dema] [[name]: :] Calf Left: [name]: Point of Measurement: 44 cm From Medial Instep 41 cm 42 cm Ankle Left: [name]: Point of Measurement: 10 cm From Medial Instep 26.5 cm 27.5 cm Vascular Assessment Pulses: Dorsalis Pedis Palpable: [Left:Yes] [[name]:Yes] Electronic Signature(s) Signed: 11/25/2019 5:26:58 PM By: Cherylin Mylar Entered By: Cherylin Mylar on 11/24/2019 14:09:07 -------------------------------------------------------------------------------- Multi Wound Chart Details Patient Name: Date of Service: Mitchell [name] 11/24/2019 1:30 PM Medical Record [id number] Patient Account Number: [id number] Date of Birth/Sex: Treating RN: July 24, 1971 (49 y.o. [name] Primary Care Court Gracia: Abbe [place] Other Clinician: Referring Steven Basso: Treating Jazzy Parmer/Extender:Robson, Joesph July, Alexander Bergeron in Treatment: 1 Vital Signs Height(in): 71 Pulse(bpm): 81 Weight(lbs): 298  Blood Pressure(mmHg):139/73 Body Mass Index(BMI): 42 Temperature(F): 98.6 Respiratory 19 Rate(breaths/min): Photos: [11:No Photos] [12:No Photos] [13:No Photos] Wound Location: [11:Left, Dorsal Foot] [12:Left, Plantar Foot] [13:Left, Posterior Lower Leg] Wounding Event: [11:Thermal Burn] [12:Thermal Burn] [13:Thermal Burn] Primary Etiology: [11:3rd degree Burn] [12:3rd degree Burn] [13:3rd degree Burn] Comorbid History: [11:Glaucoma, Sleep Apnea, Glaucoma, Sleep Apnea, Glaucoma, Sleep Apnea, Arrhythmia, Congestive Heart Failure, Hypertension, Heart Failure, Hypertension, Heart Failure, Hypertension, Myocardial Infarction, Type Myocardial Infarction,  Type Myocardial Infarction, Type II Diabetes] [12:Arrhythmia, Congestive II Diabetes] [13:Arrhythmia, Congestive II Diabetes] Date Acquired: [11:10/21/2019] [12:10/21/2019] [13:10/21/2019] Weeks of Treatment: [11:1] [12:1] [13:1] Wound Status: [11:Open] [12:Open] [13:Open] Measurements L x W x D 1.5x4.3x0.1 [12:0.9x0.4x0.1] [13:0x0x0] (cm) Area (cm) : [  11:5.066] [12:0.283] [13:0] Volume (cm) : [11:0.507] [12:0.028] [13:0] % Reduction in Area: [11:24.10%] [12:72.90%] [13:100.00%] % Reduction in Volume: 24.10% [12:73.10%] [13:100.00%] Classification: [11:Full Thickness Without Exposed Support Structures Exposed Support Structures Exposed Support Structures] [12:Full Thickness Without] [13:Full Thickness Without] Exudate Amount: [11:Medium] [12:Medium] [13:None Present] Exudate Type: [11:Serosanguineous] [12:Serosanguineous] [13:N/A] Exudate Color: [11:red, brown] [12:red, brown] [13:N/A] Wound Margin: [11:Flat and Intact] [12:N/A] [13:N/A] Granulation Amount: [11:Medium (34-66%)] [12:Medium (34-66%)] [13:None Present (0%)] Granulation Quality: [11:Pink, Pale] [12:Pink, Pale] [13:N/A] Necrotic Amount: [11:Medium (34-66%)] [12:Medium (34-66%)] [13:None Present (0%)] Exposed Structures: [11:Fat Layer (Subcutaneous Fat Layer (Subcutaneous  Fascia: No Tissue) Exposed: Yes Fascia: No Tendon: No Muscle: No Joint: No Bone: No] [12:Tissue) Exposed: Yes Fascia: No Tendon: No Muscle: No Joint: No Bone: No] [13:Fat Layer (Subcutaneous Tissue)  Exposed: No Tendon: No Muscle: No Joint: No Bone: No] Epithelialization: [11:None] [12:None] [13:Large (67-100%)] Debridement: [11:Debridement - Excisional] [12:N/A] [13:N/A] Pre-procedure [11:14:32] [12:N/A] [13:N/A] Verification/Time Out Taken: Tissue Debrided: [11:Subcutaneous, Slough] [12:N/A] [13:N/A] Level: [11:Skin/Subcutaneous Tissue] [12:N/A] [13:N/A] Debridement Area (sq cm):6.45 [12:N/A] [13:N/A] Instrument: [11:Curette] [12:N/A] [13:N/A] Bleeding: [11:Minimum] [12:N/A] [13:N/A] Hemostasis Achieved: [11:Pressure] [12:N/A] [13:N/A] Procedural Pain: [11:0] [12:N/A] [13:N/A] Post Procedural Pain: [11:0] [12:N/A] [13:N/A] Debridement Treatment Procedure was tolerated [12:N/A] [13:N/A] Response: [11:well] Post Debridement [11:1.5x4.3x0.1] [12:N/A] [13:N/A] Measurements L x W x D (cm) Post Debridement [11:0.507] [12:N/A] [13:N/A] Volume: (cm) Procedures Performed: Debridement [12:N/A 14] [13:N/A 15] Photos: [11:No Photos] [12:No Photos] [13:No Photos] Wound Location: [11:[name] Toe Great] [12:[name] Toe Second] [13:[name], Medial Foot] Wounding Event: [11:Thermal Burn] [12:Thermal Burn] [13:Thermal Burn] Primary Etiology: [11:3rd degree Burn] [12:3rd degree Burn] [13:3rd degree Burn] Comorbid History: [11:Glaucoma, Sleep Apnea, Glaucoma, Sleep Apnea, Glaucoma, Sleep Apnea, Arrhythmia, Congestive Heart Failure, Hypertension, Heart Failure, Hypertension, Heart Failure, Hypertension, Myocardial Infarction, Type Myocardial Infarction,  Type Myocardial Infarction, Type II Diabetes] [12:Arrhythmia, Congestive II Diabetes] [13:Arrhythmia, Congestive II Diabetes] Date Acquired: [11:10/21/2019] [12:10/21/2019] [13:10/21/2019] Weeks of Treatment: [11:1] [12:1] [13:1] Wound Status: [11:Open]  [12:Open] [13:Open] Measurements L x W x D 2.3x1.5x0.1 [12:1.9x1x0.1] [13:1.3x4.2x0.1] (cm) Area (cm) : [11:2.71] [12:1.492] [13:4.288] Volume (cm) : [11:0.271] [12:0.149] [13:0.429] % Reduction in Area: [11:11.80%] [12:-0.50%] [13:60.10%] % Reduction in Volume: 11.70% [12:-0.70%] [13:60.10%] Classification: [11:Full Thickness Without Exposed Support Structures Exposed Support Structures Exposed Support Structures] [12:Full Thickness Without] [13:Full Thickness Without] Exudate Amount: [11:Medium] [12:Medium] [13:Medium] Exudate Type: [11:Serosanguineous] [12:Serosanguineous] [13:Serosanguineous] Exudate Color: [11:red, brown] [12:red, brown] [13:red, brown] Wound Margin: [11:N/A] [12:Distinct, outline attached N/A] Granulation Amount: [11:Medium (34-66%)] [12:Medium (34-66%)] [13:Medium (34-66%)] Granulation Quality: [11:N/A] [12:Pink, Pale] [13:Pink, Pale] Necrotic Amount: [11:Medium (34-66%)] [12:Medium (34-66%)] [13:Medium (34-66%)] Exposed Structures: [11:Fat Layer (Subcutaneous Fat Layer (Subcutaneous Fat Layer (Subcutaneous Tissue) Exposed: Yes Fascia: No Tendon: No Muscle: No Joint: No Bone: No] [12:Tissue) Exposed: Yes Fascia: No Tendon: No Muscle: No Joint: No Bone: No] [13:Tissue) Exposed: Yes  Fascia: No Tendon: No Muscle: No Joint: No Bone: No] Epithelialization: [11:None] [12:None] [13:None] Debridement: [11:N/A] [12:N/A] [13:N/A] Tissue Debrided: [11:N/A] [12:N/A] [13:N/A] Level: [11:N/A] [12:N/A] [13:N/A] Debridement Area (sq cm):N/A [12:N/A] [13:N/A] Instrument: [11:N/A] [12:N/A] [13:N/A] Bleeding: [11:N/A] [12:N/A] [13:N/A] Hemostasis Achieved: [11:N/A] [12:N/A] [13:N/A] Procedural Pain: [11:N/A] [12:N/A] [13:N/A] Post Procedural Pain: [11:N/A] [12:N/A] [13:N/A] Debridement Treatment N/A [12:N/A] [13:N/A] Response: Post Debridement [11:N/A] [12:N/A] [13:N/A] Measurements L x W x D (cm) Post Debridement [11:N/A] [12:N/A] [13:N/A] Volume: (cm) Procedures  Performed: N/A [12:N/A 17] [13:N/A N/A N/A] Photos: [12:No Photos N/A] [13:N/A] Wound Location: [11:Left, Lateral Upper Leg] [12:N/A] [13:N/A] Wounding Event: [11:Surgical Injury] [12:N/A N/A] Primary Etiology: [11:Open Surgical Wound] [12:N/A N/A] Comorbid History: [11:Glaucoma, Sleep Apnea,  N/A Arrhythmia, Congestive Heart Failure, Hypertension, Myocardial Infarction, Type II Diabetes] [12:N/A] Date Acquired: [11:10/24/2019] [12:N/A N/A] Weeks of Treatment: [11:1] [12:N/A N/A] Wound Status: [11:Open] [12:N/A N/A] Measurements L x W x D 0x0x0 [12:N/A N/A] (cm) Area (cm) : [11:0] [12:N/A N/A] Volume (cm) : [11:0] [12:N/A N/A] % Reduction in Area: [11:100.00%] [12:N/A N/A] % Reduction in Volume: 100.00% [12:N/A N/A] Classification: [11:Partial Thickness] [12:N/A N/A] Exudate Amount: [11:None Present] [12:N/A N/A] Exudate Type: [11:N/A] [12:N/A N/A] Exudate Color: [11:N/A] [12:N/A N/A] Wound Margin: [11:Flat and Intact] [12:N/A N/A] Granulation Amount: [11:None Present (0%)] [12:N/A N/A] Granulation Quality: [11:N/A] [12:N/A N/A] Necrotic Amount: [11:None Present (0%)] [12:N/A N/A] Exposed Structures: [11:Fascia: No Fat Layer (Subcutaneous Tissue) Exposed: No Tendon: No Muscle: No Joint: No Bone: No] [12:N/A N/A] Epithelialization: [11:None] [12:N/A N/A] Debridement: [11:N/A] [12:N/A N/A] Tissue Debrided: [11:N/A] [12:N/A N/A] Level: [11:N/A] [12:N/A N/A] Debridement Area (sq cm):N/A [12:N/A N/A] Instrument: [11:N/A] [12:N/A N/A] Bleeding: [11:N/A] [12:N/A N/A] Hemostasis Achieved: [11:N/A] [12:N/A N/A] Procedural Pain: [11:N/A] [12:N/A N/A] Post Procedural Pain: [11:N/A] [12:N/A N/A] Debridement Treatment N/A [12:N/A N/A] Response: Post Debridement [11:N/A] [12:N/A N/A] Measurements L x W x D (cm) Post Debridement [11:N/A] [12:N/A N/A] Volume: (cm) Procedures Performed: N/A [12:N/A N/A] Treatment Notes Wound #11 (Left, Dorsal Foot) 1. Cleanse With Wound Cleanser 3.  Primary Dressing Applied Colle[employer] 4. Secondary Dressing Dry Gauze Roll Gauze 5. Secured With Tape Notes netting Wound #12 (Left, Plantar Foot) 1. Cleanse With Wound Cleanser 3. Primary Dressing Applied Xeroform Gauze 4. Secondary Dressing Dry Gauze Roll Gauze 5. Secured With Tape Notes netting Wound #14 ([name] Toe Great) 1. Cleanse With Wound Cleanser 3. Primary Dressing Applied Xeroform Gauze 4. Secondary Dressing Dry Gauze Roll Gauze 5. Secured With Tape Notes netting Wound #15 ([name] Toe Second) 1. Cleanse With Wound Cleanser 3. Primary Dressing Applied Xeroform Gauze 4. Secondary Dressing Dry Gauze Roll Gauze 5. Secured With Tape Notes netting Wound #16 ([name], Medial Foot) 1. Cleanse With Wound Cleanser 3. Primary Dressing Applied Xeroform Gauze 4. Secondary Dressing Dry Gauze Roll Gauze 5. Secured With Tape Notes Government social research officer) Signed: 11/24/2019 5:33:33 PM By: Zandra Abts RN, BSN Signed: 11/24/2019 5:40:04 PM By: Baltazar Najjar MD Entered By: Baltazar Najjar on 11/24/2019 16:34:45 -------------------------------------------------------------------------------- Multi-Disciplinary Care Plan Details Patient Name: Date of Service: KARDEN, SCHNEEKLOTH 11/24/2019 1:30 PM Medical Record [id number] Patient Account Number: [id number] Date of Birth/Sex: Treating RN: April 08, 1971 (49 y.o. [name] Primary Care Dariana Garbett: Abbe [place] Other Clinician: Referring Lory Nowaczyk: Treating Nillie Bartolotta/Extender:Robson, Joesph July, Alexander Bergeron in Treatment: 1 Active Inactive Nutrition Nursing Diagnoses: Impaired glucose control: actual or potential Potential for alteratiion in Nutrition/Potential for imbalanced nutrition Goals: Patient/caregiver agrees to and verbalizes understanding of need to use nutritional supplements and/or vitamins as prescribed Date Initiated: 11/17/2019 Target Resolution Date:  12/19/2019 Goal Status: Active Goal Status: Active Patient/caregiver verbalizes understanding of need to maintain therapeutic glucose control per primary care physician Date Initiated: 11/17/2019 Target Resolution Date: 12/19/2019 Goal Status: Active Interventions: Assess HgA1c results as ordered upon admission and as needed Assess patient nutrition upon admission and as needed per policy Provide education on elevated blood sugars and impact on wound healing Provide education on nutrition Treatment Activities: Education provided on Nutrition : 11/17/2019 Notes: Wound/Skin Impairment Nursing Diagnoses: Impaired tissue integrity Knowledge deficit related to ulceration/compromised skin integrity Goals: Patient/caregiver will verbalize understanding of skin care regimen Date Initiated: 11/17/2019 Target Resolution Date: 12/19/2019 Goal Status: Active Ulcer/skin breakdown will have a volume reduction of 30% by week 4 Date Initiated:  11/17/2019 Target Resolution Date: 12/19/2019 Goal Status: Active Interventions: Assess patient/caregiver ability to obtain necessary supplies Assess patient/caregiver ability to perform ulcer/skin care regimen upon admission and as needed Assess ulceration(s) every visit Provide education on ulcer and skin care Notes: Electronic Signature(s) Signed: 11/24/2019 5:33:33 PM By: Zandra Abts RN, BSN Entered By: Zandra Abts on 11/24/2019 13:43:22 -------------------------------------------------------------------------------- Pain Assessment Details Patient Name: Date of Service: Mitchell [name] 11/24/2019 1:30 PM Medical Record [id number] Patient Account Number: [id number] Date of Birth/Sex: Treating RN: 19-Nov-1970 (49 y.o. Mitchell [name] Primary Care Jayvyn Haselton: Abbe [place] Other Clinician: Referring Nanette Wirsing: Treating Cailee Blanke/Extender:Robson, Joesph July, Alexander Bergeron in Treatment: 1 Active Problems Location of Pain Severity  and Description of Pain Patient Has Paino No Site Locations Pain Management and Medication Current Pain Management: Electronic Signature(s) Signed: 11/25/2019 5:26:58 PM By: Cherylin Mylar Entered By: Cherylin Mylar on 11/24/2019 13:55:50 -------------------------------------------------------------------------------- Patient/Caregiver Education Details Patient Name: Date of Service: Mitchell [name] 4/26/2021andnbsp1:30 PM Medical Record [id number] Patient Account Number: [id number] Date of Birth/Gender: [birth year] (49 y.o. M) Treating RN: Zandra Abts Primary Care Physician: Abbe Amsterdam Other Clinician: Referring Physician: Treating Physician/Extender:Robson, Joesph July, Alexander Bergeron in Treatment: 1 Education Assessment Education Provided To: Patient Education Topics Provided Wound/Skin Impairment: Methods: Explain/Verbal Responses: State content correctly Electronic Signature(s) Signed: 11/24/2019 5:33:33 PM By: Zandra Abts RN, BSN Entered By: Zandra Abts on 11/24/2019 13:43:35 -------------------------------------------------------------------------------- Wound Assessment Details Patient Name: Date of Service: Mitchell [name] 11/24/2019 1:30 PM Medical Record [id number] Patient Account Number: [id number] Date of Birth/Sex: Treating RN: Jul 27, 1971 (49 y.o. Mitchell [name] Primary Care Beverlee Wilmarth: Abbe [place] Other Clinician: Referring Emberlin Verner: Treating Shannen Vernon/Extender:Robson, Joesph July, Alexander Bergeron in Treatment: 1 Wound Status Wound Number: 11 Primary 3rd degree Burn Wound Location: Left, Dorsal Foot Etiology: Wounding Event: Thermal Burn Wound Open Date Acquired: 10/21/2019 Status: Weeks Of Treatment:1 ComorbidGlaucoma, Sleep Apnea, Arrhythmia, Congestive Clustered Wound: No History: Heart Failure, Hypertension, Myocardial Infarction, Type II Diabetes Photos Photo Uploaded By: Benjaman Kindler on 11/25/2019  14:46:06 Wound Measurements Length: (cm) 1.5 % Reduct Width: (cm) 4.3 % Reduct Depth: (cm) 0.1 Epitheli Area: (cm) 5.066 Tunneli Volume: (cm) 0.507 Undermi Wound Description Classification: Full Thickness Without Exposed Support Foul Od Structures Slough/ Wound Flat and Intact Margin: Exudate Medium Amount: Exudate Type:Serosanguineous Exudate red, brown Color: Wound Bed Granulation Amount: Medium (34-66%) Granulation Quality: Pink, Pale Fascia E Necrotic Amount: Medium (34-66%) Fat Laye Necrotic Quality: Adherent Slough Tendon E Muscle E Joint Ex Bone Exp or After Cleansing: No Fibrino Yes Exposed Structure xposed: No r (Subcutaneous Tissue) Exposed: Yes xposed: No xposed: No posed: No osed: No ion in Area: 24.1% ion in Volume: 24.1% alization: None ng: No ning: No Treatment Notes Wound #11 (Left, Dorsal Foot) 1. Cleanse With Wound Cleanser 3. Primary Dressing Applied Collegen [employer] 4. Secondary Dressing Dry Gauze Roll Gauze 5. Secured With Tape Notes Government social research officer) Signed: 11/25/2019 5:26:58 PM By: Cherylin Mylar Entered By: Cherylin Mylar on 11/24/2019 14:18:53 -------------------------------------------------------------------------------- Wound Assessment Details Patient Name: Date of Service: ROYDEN, BULMAN 11/24/2019 1:30 PM Medical Record [id number] Patient Account Number: [id number] Date of Birth/Sex: Treating RN: 1971-06-09 (49 y.o. Mitchell [name] Primary Care Elhadji Pecore: Abbe [place] Other Clinician: Referring Jaida Basurto: Treating Deago Burruss/Extender:Robson, Joesph July, Alexander Bergeron in Treatment: 1 Wound Status Wound Number: 12 Primary 3rd degree Burn Wound Location: Left, Plantar Foot Etiology: Wounding Event: Thermal Burn Wound Open Date Acquired: 10/21/2019 Status: Weeks Of Treatment:1 ComorbidGlaucoma, Sleep Apnea, Arrhythmia, Congestive Clustered Wound: No History: Heart Failure,  Hypertension, Myocardial Infarction, Type  II Diabetes Photos Photo Uploaded By: Benjaman Kindler on 11/25/2019 14:46:07 Wound Measurements Length: (cm) 0.9 % Reduct Width: (cm) 0.4 % Reduct Depth: (cm) 0.1 Epitheli Area: (cm) 0.283 Tunneli Volume: (cm) 0.028 Undermi Wound Description Classification: Full Thickness Without Exposed Support Foul Od Structures Slough/ Exudate Medium Amount: Exudate Type:Serosanguineous Exudate Exudate red, brown Color: Wound Bed Granulation Amount: Medium (34-66%) Granulation Quality: Pink, Pale Fascia E Necrotic Amount: Medium (34-66%) Fat Laye Necrotic Quality: Adherent Slough Tendon E Muscle E Joint Ex Bone Exp or After Cleansing: No Fibrino Yes Exposed Structure xposed: No r (Subcutaneous Tissue) Exposed: Yes xposed: No xposed: No posed: No osed: No ion in Area: 72.9% ion in Volume: 73.1% alization: None ng: No ning: No Treatment Notes Wound #12 (Left, Plantar Foot) 1. Cleanse With Wound Cleanser 3. Primary Dressing Applied Xeroform Gauze 4. Secondary Dressing Dry Gauze Roll Gauze 5. Secured With Tape Notes Government social research officer) Signed: 11/25/2019 5:26:58 PM By: Cherylin Mylar Entered By: Cherylin Mylar on 11/24/2019 14:19:19 -------------------------------------------------------------------------------- Wound Assessment Details Patient Name: Date of Service: JAK, HAGGAR 11/24/2019 1:30 PM Medical Record [id number] Patient Account Number: [id number] Date of Birth/Sex: Treating RN: Feb 15, 1971 (49 y.o. Mitchell [name] Primary Care Sanyiah Kanzler: Abbe [place] Other Clinician: Referring Kenith Trickel: Treating Tekesha Almgren/Extender:Robson, Joesph July, Alexander Bergeron in Treatment: 1 Wound Status Wound Number: 13 Primary 3rd degree Burn Wound Location: Left, Posterior Lower Leg Etiology: Wounding Event: Thermal Burn Wound Open Date Acquired: 10/21/2019 Status: Weeks Of Treatment:1  ComorbidGlaucoma, Sleep Apnea, Arrhythmia, Congestive Clustered Wound: No History: Heart Failure, Hypertension, Myocardial Infarction, Type II Diabetes Photos Photo Uploaded By: Benjaman Kindler on 11/25/2019 14:47:50 Wound Measurements Length: (cm) 0 % Reduct Width: (cm) 0 % Reduct Depth: (cm) 0 Epitheli Area: (cm) 0 Tunneli Volume: (cm) 0 Undermi Wound Description Full Thickness Without Exposed Support Foul Od Classification: Structures Slough/ Exudate None Present Amount: Wound Bed Granulation Amount: None Present (0%) Necrotic Amount: None Present (0%) Fascia E Fat Laye Tendon E Muscle E Joint Ex Bone Exp or After Cleansing: No Fibrino No Exposed Structure xposed: No r (Subcutaneous Tissue) Exposed: No xposed: No xposed: No posed: No osed: No ion in Area: 100% ion in Volume: 100% alization: Large (67-100%) ng: No ning: No Electronic Signature(s) Signed: 11/25/2019 5:26:58 PM By: Cherylin Mylar Entered By: Cherylin Mylar on 11/24/2019 14:15:59 -------------------------------------------------------------------------------- Wound Assessment Details Patient Name: Date of Service: EBBIE, CHERRY 11/24/2019 1:30 PM Medical Record [id number] Patient Account Number: [id number] Date of Birth/Sex: Treating RN: 1971-04-13 (49 y.o. Mitchell [name] Primary Care Ayomide Purdy: Abbe [place] Other Clinician: Referring Nicholad Kautzman: Treating Karimah Winquist/Extender:Robson, Joesph July, Alexander Bergeron in Treatment: 1 Wound Status Wound Number: 14 Primary 3rd degree Burn Wound Location: [name] Toe Great Etiology: Wounding Event: Thermal Burn Wound Open Date Acquired: 10/21/2019 Status: Weeks Of Treatment:1 ComorbidGlaucoma, Sleep Apnea, Arrhythmia, Congestive Clustered Wound: No History: Heart Failure, Hypertension, Myocardial Infarction, Type II Diabetes Photos Photo Uploaded By: Benjaman Kindler on 11/25/2019 14:46:53 Wound Measurements Length: (cm) 2.3 %  Reduct Width: (cm) 1.5 % Reduct Depth: (cm) 0.1 Epitheli Area: (cm) 2.71 Tunneli Volume: (cm) 0.271 Undermi Wound Description Full Thickness Without Exposed Support Foul Od Classification: Structures Slough/ Exudate Medium Amount: Exudate Type:Serosanguineous Exudate red, brown Color: Wound Bed Granulation Amount: Medium (34-66%) Necrotic Amount: Medium (34-66%) Fascia E Necrotic Quality: Adherent Slough Fat Laye Tendon E Muscle E Joint Ex Bone Exp or After Cleansing: No Fibrino Yes Exposed Structure xposed: No r (Subcutaneous Tissue) Exposed: Yes xposed: No xposed: No posed: No osed: No ion in Area: 11.8%  ion in Volume: 11.7% alization: None ng: No ning: No Treatment Notes Wound #14 ([name] Toe Great) 1. Cleanse With Wound Cleanser 3. Primary Dressing Applied Xeroform Gauze 4. Secondary Dressing Dry Gauze Roll Gauze 5. Secured With Tape Notes Government social research officer) Signed: 11/25/2019 5:26:58 PM By: Cherylin Mylar Entered By: Cherylin Mylar on 11/24/2019 14:17:46 -------------------------------------------------------------------------------- Wound Assessment Details Patient Name: Date of Service: DATRELL, DUNTON 11/24/2019 1:30 PM Medical Record [id number] Patient Account Number: [id number] Date of Birth/Sex: Treating RN: 08-Apr-1971 (49 y.o. Mitchell [name] Primary Care Devery Odwyer: Abbe Amsterdam Other Clinician: Referring Telvin Reinders: Treating Hatsue Sime/Extender:Robson, Joesph July, Alexander Bergeron in Treatment: 1 Wound Status Wound Number: 15 Primary 3rd degree Burn Wound Location: [name] Toe Second Etiology: Wounding Event: Thermal Burn Wound Open Date Acquired: 10/21/2019 Status: Weeks Of Treatment:1 ComorbidGlaucoma, Sleep Apnea, Arrhythmia, Congestive Clustered Wound: No History: Heart Failure, Hypertension, Myocardial Infarction, Type II Diabetes Photos Photo Uploaded By: Benjaman Kindler on 11/25/2019  14:46:54 Wound Measurements Length: (cm) 1.9 % Reduct Width: (cm) 1 % Reduct Depth: (cm) 0.1 Epitheli Area: (cm) 1.492 Tunneli Volume: (cm) 0.149 Undermi Wound Description Classification: Full Thickness Without Exposed Support Foul Od Structures Slough/ Wound Distinct, outline attached Margin: Exudate Medium Amount: Exudate Type:Serosanguineous Exudate red, brown Color: Wound Bed Granulation Amount: Medium (34-66%) Granulation Quality: Pink, Pale Fascia E Necrotic Amount: Medium (34-66%) Fat Laye Necrotic Quality: Adherent Slough Tendon E Muscle E Joint Ex Bone Exp or After Cleansing: No Fibrino Yes Exposed Structure xposed: No r (Subcutaneous Tissue) Exposed: Yes xposed: No xposed: No posed: No osed: No ion in Area: -0.5% ion in Volume: -0.7% alization: None ng: No ning: No Treatment Notes Wound #15 ([name] Toe Second) 1. Cleanse With Wound Cleanser 3. Primary Dressing Applied Xeroform Gauze 4. Secondary Dressing Dry Gauze Roll Gauze 5. Secured With Tape Notes Government social research officer) Signed: 11/25/2019 5:26:58 PM By: Cherylin Mylar Entered By: Cherylin Mylar on 11/24/2019 14:18:27 -------------------------------------------------------------------------------- Wound Assessment Details Patient Name: Date of Service: BRANDAN, ROBICHEAUX 11/24/2019 1:30 PM Medical Record [id number] Patient Account Number: [id number] Date of Birth/Sex: Treating RN: 1971/01/30 (49 y.o. Mitchell [name] Primary Care Afsheen Antony: Abbe Amsterdam Other Clinician: Referring Ballard Budney: Treating Fruma Africa/Extender:Robson, Joesph July, Alexander Bergeron in Treatment: 1 Wound Status Wound Number: 16 Primary 3rd degree Burn Wound Location: [name], Medial Foot Etiology: Wounding Event: Thermal Burn Wound Open Date Acquired: 10/21/2019 Status: Weeks Of Treatment:1 ComorbidGlaucoma, Sleep Apnea, Arrhythmia, Congestive Clustered Wound: No History: Heart  Failure, Hypertension, Myocardial Infarction, Type II Diabetes Photos Photo Uploaded By: Benjaman Kindler on 11/25/2019 14:47:29 Wound Measurements Length: (cm) 1.3 % Reduct Width: (cm) 4.2 % Reduct Depth: (cm) 0.1 Epitheli Area: (cm) 4.288 Tunneli Volume: (cm) 0.429 Undermi Wound Description Full Thickness Without Exposed Support Foul Od Classification: Structures Slough/ Exudate Medium Amount: Exudate Type:Serosanguineous Exudate red, brown Color: Wound Bed Granulation Amount: Medium (34-66%) Granulation Quality: Pink, Pale Fascia E Necrotic Amount: Medium (34-66%) Fat Laye Necrotic Quality: Adherent Slough Tendon E Muscle E Joint Ex Bone Exp or After Cleansing: No Fibrino Yes Exposed Structure xposed: No r (Subcutaneous Tissue) Exposed: Yes xposed: No xposed: No posed: No osed: No ion in Area: 60.1% ion in Volume: 60.1% alization: None ng: No ning: No Treatment Notes Wound #16 ([name], Medial Foot) 1. Cleanse With Wound Cleanser 3. Primary Dressing Applied Xeroform Gauze 4. Secondary Dressing Dry Gauze Roll Gauze 5. Secured With Tape Notes Government social research officer) Signed: 11/25/2019 5:26:58 PM By: Cherylin Mylar Entered By: Cherylin Mylar on 11/24/2019 14:17:17 -------------------------------------------------------------------------------- Wound Assessment Details Patient Name: Date of  Service: NIJEE, HEATWOLE 11/24/2019 1:30 PM Medical Record [id number] Patient Account Number: [id number] Date of Birth/Sex: Treating RN: 1971/03/28 (49 y.o. Marvis Repress Primary Care Secilia Apps: [name] Other Clinician: Referring Khadejah Son: Treating Overton Boggus/Extender:Robson, Finis Bud, Loni Muse in Treatment: 1 Wound Status Wound Number: 17 Primary Open Surgical Wound Wound Location: Left, Lateral Upper Leg Etiology: Wounding Event: Surgical Injury Wound Open Date Acquired: 10/24/2019 Status: Weeks Of Treatment:1  ComorbidGlaucoma, Sleep Apnea, Arrhythmia, Congestive Clustered Wound: No History: Heart Failure, Hypertension, Myocardial Infarction, Type II Diabetes Photos Photo Uploaded By: Mikeal Hawthorne on 11/25/2019 14:47:30 Wound Measurements Length: (cm) 0 % Reduction Width: (cm) 0 % Reduction Depth: (cm) 0 Epitheliali Area: (cm) 0 Tunneling: Volume: (cm) 0 Underminin Wound Description Classification: Partial Thickness Foul Odor A Wound Margin: Flat and Intact Slough/Fibr Exudate Amount: None Present Wound Bed Granulation Amount: None Present (0%) Necrotic Amount: None Present (0%) Fascia Expo Fat Layer ( Tendon Expo Muscle Expo Joint Expos Bone Expose fter Cleansing: No ino No Exposed Structure sed: No Subcutaneous Tissue) Exposed: No sed: No sed: No ed: No d: No in Area: 100% in Volume: 100% zation: None No g: No Electronic Signature(s) Signed: 11/25/2019 5:26:58 PM By: Kela Millin Entered By: Kela Millin on 11/24/2019 14:16:27 -------------------------------------------------------------------------------- Vitals Details Patient Name: Date of Service: [name] 11/24/2019 1:30 PM Medical Record [id number] Patient Account Number: [id number] Date of Birth/Sex: Treating RN: [birth year] (48 y.o. Marvis Repress Primary Care Kristianna Saperstein: [name] Other Clinician: Referring Vikram Tillett: Treating Mercedees Convery/Extender:Robson, Finis Bud, Loni Muse in Treatment: 1 Vital Signs Time Taken: 13:55 Temperature (F): 98.6 Height (in): 71 Pulse (bpm): 81 Weight (lbs): 298 Respiratory Rate (breaths/min): 19 Body Mass Index (BMI): 41.6 Blood Pressure (mmHg): 139/73 Reference Range: 80 - 120 mg / dl Electronic Signature(s) Signed: 11/25/2019 5:26:58 PM By: Kela Millin Entered By: Kela Millin on 11/24/2019 13:55:42

## 2019-11-27 ENCOUNTER — Ambulatory Visit: Payer: No Typology Code available for payment source | Admitting: Endocrinology

## 2019-11-28 ENCOUNTER — Telehealth: Payer: Self-pay | Admitting: Family Medicine

## 2019-11-28 NOTE — Telephone Encounter (Signed)
Need Verbal orders to change for weight perimeter change  per Mitchell [name] with [facility]. Patient weighing 292lb.today 11/28/2019. Change to notifiy PCP 3 to 5 pound weight change per week. PCP out of the office and Mitchell [name] did give a verbal ok for this change for PCP to be notified of this change. Mitchell [name] will send over paperwork for PCP to sign. Call back number to Mitchell [name] is [phone]/  .

## 2019-12-01 ENCOUNTER — Other Ambulatory Visit: Payer: Self-pay

## 2019-12-01 ENCOUNTER — Encounter (HOSPITAL_BASED_OUTPATIENT_CLINIC_OR_DEPARTMENT_OTHER): Payer: No Typology Code available for payment source | Attending: Internal Medicine | Admitting: Internal Medicine

## 2019-12-01 DIAGNOSIS — I110 Hypertensive heart disease with heart failure: Secondary | ICD-10-CM | POA: Insufficient documentation

## 2019-12-01 DIAGNOSIS — L97514 Non-pressure chronic ulcer of other part of right foot with necrosis of bone: Secondary | ICD-10-CM | POA: Diagnosis not present

## 2019-12-01 DIAGNOSIS — I10 Essential (primary) hypertension: Secondary | ICD-10-CM | POA: Diagnosis not present

## 2019-12-01 DIAGNOSIS — T25321D Burn of third degree of right foot, subsequent encounter: Secondary | ICD-10-CM | POA: Insufficient documentation

## 2019-12-01 DIAGNOSIS — E785 Hyperlipidemia, unspecified: Secondary | ICD-10-CM | POA: Diagnosis not present

## 2019-12-01 DIAGNOSIS — Z794 Long term (current) use of insulin: Secondary | ICD-10-CM | POA: Diagnosis not present

## 2019-12-01 DIAGNOSIS — Z8673 Personal history of transient ischemic attack (TIA), and cerebral infarction without residual deficits: Secondary | ICD-10-CM | POA: Insufficient documentation

## 2019-12-01 DIAGNOSIS — E119 Type 2 diabetes mellitus without complications: Secondary | ICD-10-CM | POA: Insufficient documentation

## 2019-12-01 DIAGNOSIS — L97521 Non-pressure chronic ulcer of other part of left foot limited to breakdown of skin: Secondary | ICD-10-CM | POA: Insufficient documentation

## 2019-12-01 DIAGNOSIS — F209 Schizophrenia, unspecified: Secondary | ICD-10-CM | POA: Insufficient documentation

## 2019-12-01 DIAGNOSIS — I5022 Chronic systolic (congestive) heart failure: Secondary | ICD-10-CM | POA: Diagnosis not present

## 2019-12-01 DIAGNOSIS — I428 Other cardiomyopathies: Secondary | ICD-10-CM | POA: Diagnosis not present

## 2019-12-01 DIAGNOSIS — E669 Obesity, unspecified: Secondary | ICD-10-CM | POA: Insufficient documentation

## 2019-12-01 DIAGNOSIS — T25322D Burn of third degree of left foot, subsequent encounter: Secondary | ICD-10-CM | POA: Insufficient documentation

## 2019-12-01 DIAGNOSIS — E11621 Type 2 diabetes mellitus with foot ulcer: Secondary | ICD-10-CM | POA: Diagnosis not present

## 2019-12-01 DIAGNOSIS — X58XXXD Exposure to other specified factors, subsequent encounter: Secondary | ICD-10-CM | POA: Diagnosis not present

## 2019-12-01 NOTE — Progress Notes (Signed)
ARIEH, BOGUE ([id number]) Visit Report for 12/01/2019 Debridement Details Patient Name: Date of Service: Mitchell [name] 12/01/2019 3:15 PM Medical Record Number: [id number] Patient Account Number: [id number] Date of Birth/Sex: Treating RN: 10-Oct-1970 (49 y.o. [name] Primary Care Provider: Abbe Amsterdam Other Clinician: Referring Provider: Treating Provider/Extender: Bronwen Betters in Treatment: 2 Debridement Performed for Assessment: Wound #11 Left,Dorsal Foot Performed By: Physician Maxwell Caul., MD Debridement Type: Debridement Level of Consciousness (Pre-procedure): Awake and Alert Pre-procedure Verification/Time Out Yes - 16:18 Taken: Start Time: 16:18 T Area Debrided (L x W): otal 0.8 (cm) x 3.7 (cm) = 2.96 (cm) Tissue and other material debrided: Viable, Non-Viable, Subcutaneous Level: Skin/Subcutaneous Tissue Debridement Description: Excisional Instrument: Curette Bleeding: Minimum Hemostasis Achieved: Pressure End Time: 16:20 Procedural Pain: 0 Post Procedural Pain: 0 Response to Treatment: Procedure was tolerated well Level of Consciousness (Post- Awake and Alert procedure): Post Debridement Measurements of Total Wound Length: (cm) 0.8 Width: (cm) 3.7 Depth: (cm) 0.1 Volume: (cm) 0.232 Character of Wound/Ulcer Post Debridement: Improved Post Procedure Diagnosis Same as Pre-procedure Electronic Signature(s) Signed: 12/01/2019 5:27:06 PM By: Zandra Abts RN, BSN Signed: 12/01/2019 5:33:20 PM By: Baltazar Najjar MD Entered By: Baltazar Najjar on 12/01/2019 16:49:31 -------------------------------------------------------------------------------- Debridement Details Patient Name: Date of Service: Mitchell [name]. 12/01/2019 3:15 PM Medical Record Number: [id number] Patient Account Number: [id number] Date of Birth/Sex: Treating RN: Mar 09, 1971 (49 y.o. [name] Primary Care Provider: Abbe Amsterdam Other  Clinician: Referring Provider: Treating Provider/Extender: Bronwen Betters in Treatment: 2 Debridement Performed for Assessment: Wound #14 Right T [place] Performed By: Physician Maxwell Caul., MD Debridement Type: Debridement Level of Consciousness (Pre-procedure): Awake and Alert Pre-procedure Verification/Time Out Yes - 16:18 Taken: Start Time: 16:18 T Area Debrided (L x W): otal 1.8 (cm) x 1.2 (cm) = 2.16 (cm) Tissue and other material debrided: Viable, Non-Viable, Subcutaneous Level: Skin/Subcutaneous Tissue Debridement Description: Excisional Instrument: Curette Bleeding: Minimum Hemostasis Achieved: Pressure End Time: 16:20 Procedural Pain: 0 Post Procedural Pain: 0 Response to Treatment: Procedure was tolerated well Level of Consciousness (Post- Awake and Alert procedure): Post Debridement Measurements of Total Wound Length: (cm) 1.8 Width: (cm) 1.2 Depth: (cm) 0.1 Volume: (cm) 0.17 Character of Wound/Ulcer Post Debridement: Improved Post Procedure Diagnosis Same as Pre-procedure Electronic Signature(s) Signed: 12/01/2019 5:27:06 PM By: Zandra Abts RN, BSN Signed: 12/01/2019 5:33:20 PM By: Baltazar Najjar MD Entered By: Baltazar Najjar on 12/01/2019 16:49:45 -------------------------------------------------------------------------------- HPI Details Patient Name: Date of Service: Mitchell [name]. 12/01/2019 3:15 PM Medical Record Number: [id number] Patient Account Number: [id number] Date of Birth/Sex: Treating RN: [birth year] (49 y.o. [name] Primary Care Provider: Abbe Amsterdam Other Clinician: Referring Provider: Treating Provider/Extender: Bronwen Betters in Treatment: 2 History of Present Illness Location: right fifth metatarsal head on the plantar aspect Quality: Patient reports No Pain. Severity: Patient states wound(s) are getting worse. Duration: Patient has had the wound for < 4  weeks prior to presenting for treatment Context: The wound would happen gradually Modifying Factors: Patient wound(s)/ulcer(s) are worsening due to :unable to get good diabetic shoes and has not seen the podiatrist who Dr. Leanord Hawking referred to him 4 months ago ssociated Signs and Symptoms: Patient reports having increase discharge. A HPI Description: This 49 year old gentleman known to our wound center for the last 2-1/2 years now returns with a recurrent large callus and ulceration under the right fifth metatarsal head which has been draining fluid for at least  4 weeks He is a diabetic with some element of neuropathy but no evidence of peripheral arterial disease and has not had any significant vascular work done in the past. his medical history significant for diabetes mellitus, hypertension, obesity, stroke 3, schizophrenia, nonischemic cardiomyopathy, hyperlipidemia, chronic systolic heart failure.his ejection fraction was about 20% and his diabetes showed hyperglycemia. most recent hemoglobin A1c was 10.3%. he has not been a smoker. patient has not got diabetic shoes nor has he seen the podiatrist, Dr. Leanord Hawking and referred to him 4 months ago. he says his last hemoglobin A1c done most recently was over 12% 06/28/17; again a recurrent wound on the right fifth plantar metatarsal head. Comes in today with callus over the surface. Using a #5 curet he remove this to find a small linear open wound not a healed surface. When he left here several months ago I had recommended consideration of a podiatry consult either for custom-made footwear or consideration of possible surgery on the subluxed bone in this area. I don't think either was taken care of. 07/06/17; the patient is healed over. For one reason or another he has not managed to get diabetic shoes or custom inserts. He has been contacted by podiatry, I have referred him there to consider how to adequately offload a subluxed fifth metatarsal head  either with offloading devices are shoes or perhaps even with surgery. As usual he heals very quickly if the pressure is offloaded. On this time one week of a total contact cast Of note he would not be a good surgical candidate or at least for general anesthesia. He has a severe cardiomyopathy with an apparent ejection fraction of 20%. He also has poorly controlled type 2 diabetes ====== Old notes: This patient is a type II diabetic on insulin. As far as I can tell he does not have diabetic neuropathy or PAD. He states that 2 weeks ago he noted an ulcer on his right plantar foot. He went to the store but the hydrogen peroxide alcohol and he's been dressing this at home. He has no prior wound history. He has not been systemically unwell.he was seen in urgent care on 6/18. An x-ray suggested an abscess in the soft tissues but no erosive change or bony destruction he was prescribed clindamycin but I don't see that a culture was done. Looking over his problem list he has a history of chronic systolic heart failure with an EF of 20-25% listed as a nonischemic cardiomyopathy. He will not be a candidate for HBO 01/29/15; the wound over his fifth metatarsal head is no better or no worse. There is still an open area that probes to his fifth metatarsal head. An MRI is booked for next Friday. His culture last week grew pansensitive Escherichia coli. His clindamycin has been stopped and he is now on ciprofloxacin. 02/05/15; the wound over his right fifth metatarsal head appears to be improved. Apparently he went to the ER concerned that they have loss over alginate packing in the wound. He has had his MRI done this shows no evidence of osteomyelitis it does show possible inflammation of the underlying pressure area probably consistent with infection. 02/12/15; the wound over his right fifth metatarsal head continues to improve. He has completed his ciprofloxacin. He is still complaining of wound pain. I suspect he  has neuropathy. His MRI did not suggest any evidence of osteomyelitis. 02/18/15; the wound over his right fifth metatarsal head is improved. Debridement with a #15 blade to remove surface slough  insert for circumferential callus. the tissue continues to look improved and healthy. 02/26/15. The wound has considerably improved. No debridement is required. The base of this appears to be healthy. 03/19/15 the wound over the right fifth metatarsal head has totally resolved. However I think the area will continue to be an area at risk of. There is not much in the way of subcutaneous tissue and the skn is mostly over his right fifth metatarsal head. I've advised continued offloading of this area using felt with insoles in issues. His primary doctor manages his diabetes and I've encouraged him to ask about ordering diabetic foot wear if his insurance will cover it 05/06/15; the patient returns today with with a wound over the right fifth metatarsal head plantar aspect that. 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 His foot is wet the pressure is prolonged. His daughter noted a wound on his foot today and he is come in for an achievement. He is a diabetic with possible diabetic neuropathy. He doesn't have macrovascular issues. He required a total contact cast for at least a month last time to get closure of this wound. 05/14/15; the wound over the right fifth metatarsal head required a surgical debridement. After this it cleans up quite nicely and is fairly superficial. There is nothing obviously infected here but the patient is complaining of pain on the lateral aspect of the wound and is quite tender. 05/21/15 the wound over the right fifth metatarsal head requires a surgical debridement. Post debridement the wound looks quite stable and is fairly superficial although we had the same outcome last week there is no obvious infection here no palpable tenderness. The patient is back at work 06/03/15; the wound  is over the right fifth metatarsal head. This required surgical debridement. The wound appears to be stable however I think there is too much pressure here. 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 There is no clear evidence of infection. this patient had an MRI in July 2016 that did not show evidence of soft tissue abscess or osteomyelitis. We managed to heal him out in a total contact cast by and large.we have reordered another x-ray of the bone area given the extent of this wound and the underlying debridement. However I continue to think that this is inadequate offloading 06/11/15 the wound is over the plantar right fifth metatarsal head no debridement was required. He states he is keeping the pressure off this. The base of the wound looks healthy. There is still doubt that this 06/18/15 patient arrived with a linear wound over the plantar right fifth metatarsal head however there was significant undermining. I therefore did a surgical debridement to fully expose the wound 07/02/15; once again the patient arrived with a ragged nonviable surface with a linear wound between. Although this was debridement surprisingly underneath the base of this wound actually looks fairly stable with healthy-looking tissue 07/09/15; the patient arrives today having the wound in almost the same situation as usual. He requires a surgical debridement of callus skin and nonviable subcutaneous tissue. Once again the base of this appears to be healthy. 07/23/15; the patient arrives with circumferential callus and nonviable subcutaneous tissue around the wound on his plantar right fifth metatarsal head. He requires surgical debridement of callus skin and nonviable subcutaneous tissue. This is roughly in the same status last time. He is going to require a total contact cast, he is simply not able to  a float this in any other manner. We have previously healed him with a  total contact cast 07/30/15; the patient arrives with a miraculous improvement in the condition of the wound over the right fifth metatarsal head this is fully epithelialized and with application of a total contact cast this week should be healed next week 08/06/15; patient had his total contact cast changed without incident his wound looks almost totally healed. We asked him to bring in issue next week 08/13/15; as predicted the area has totally healed and is totally epithelialized. 09/02/15 the patient returns today having a recurrence in the same area plantar surface of his right fifth metatarsal head. This is surrounded by callus. He had not yet obtained diabetic footwear although he had been to be fitted. He simply saw blood on the floor when he left the shower last night. He states his wife and daughter were keeping an eye in his feet and there was nothing before that. 09/09/15; once again underwent total contact cast the wound is basically down to half the overall circumference as last time. Invasive this appears healthy there is no undermining. He tells me that he was measured for diabetic shoes of course they're close to $300, he seems to have difficulty with this as [place] [employer], I'm not sure whether they will cover any part of this. Wound measurements down to 0.3 x 0.3 x 0.2 cm 09/16/15 this patient has a recurrent diabetic foot ulcer over his right fifth metatarsal head. The metatarsal head itself is subluxed. He has not yet obtained his diabetic footwear. MRI does not show osteomyelitis in July/16. He does not have a macrovascular issue. He heals easily and total contact casting 12/30/15 READMIT the patient is back again with the wound over the right fifth metatarsal head. The metatarsal head itself is known to be subluxed. He ; apparently had a co-pay for diabetic shoes in the high $200 which she could not afford. Apparently the recurrence has been over the last 3 weeks with an open area  with a large amount of circumferential callus and necrotic subcutaneous tissue. In the past this is been easily fixed with debridement and a total contact cast 01/07/16; the area over the fifth metatarsal head on the right requires debridement of surface slough and circumferential callus. There is no infection here is culture is negative. In the past this is been easily fixed with debridement and a total contact cast. He has never been able to afford a diabetic footwear prescriptions 01/21/16; area over the fifth metatarsal head on the record on the right is unchanged from last time. This is a bit disappointing. There is no infection. Some circumferential callus that was removed. Will change the dressing from silver alginate to Silver [employer] and and replaced the total contact cast 01/27/16 the area over the fifth metatarsal head is much smaller this week. No infection. We have been using [employer] and under a total contact cast 02/03/16; the area over the fifth metatarsal head continues to be smaller, this week 0.2 x 0.2 x 0.2 we have been using [employer] and under a total contact cast 02/09/16; the area over the fifth metatarsal heads is resolved. I removed some surface eschar. There is no open wound here. I believe there is some subluxation of the fifth metatarsal head. The patient has been reluctant to spend the money necessary to get diabetic footwear. READMISSION 04/20/16; this is a patient I had on 2 different occasions in this clinic both for  diabetic ulcers over his right plantar fifth metatarsal head. He does not have known PAD or diabetic neuropathy. He tells me that greater than 3 weeks ago he was finally able to get his diabetic shoes. Almost that same day he got the shoe wet and that night he had a large blister over the surface of the foot in the same region as his previous wounds. He went to the ER on 04/06/16 the blister was excised. He was given a prescription for Keflex. The  patient states that no x-ray was done. It appears that he is just been using wet-to-dry dressings on this. His history is that he requires total contact casting usually to heal these areas even when he is offloaded and are other forms of offloading footwear in the clinic. His diabetes is probably not well controlled in looking through [place] [facility] there is nothing particularly helpful. MRI of the foot in 2016 no osteo. no recent xrays or vasc studies. no recent hgbA1c 04/28/16; continues to have a fairly large wound over the lateral aspect of his right fifth metatarsal head. Wound is debrided with a curet, nonviable skin removed with pickups and scissors below the wound and towards the plantar fourth and fifth toes. The area feels somewhat boggy but there is no tenderness or warmth. No drainage is noted. He is going to need a plain x-ray. 10/6 last 17; wound dimensions are down somewhat hyper granulation present. X-ray from last week showed soft tissue ulceration lateral to the distal fifth metatarsal. There was no evidence of erosive change or bony destruction. There was some suggestion of erosive arthropathy in the first and second MTP joints. 05/12/16; wound debrided of surface slough and hyper granulation. No evidence of surrounding infection. Wound appears improved at the right fifth metatarsal head 05/19/16; some mild hyper granulation and drainage but no need for debridement. No evidence of surrounding infection. Tissue improved at the right fifth metatarsal head 05/26/16; patient using Hydrofera Blue total contact cast. As usual for this patient the wound looks excellent 06/01/16; patient continues Hydrofera Blue total contact cast. As usual for this patient is wound looks excellent and is progressing gradually towards closure. 06/08/16; patient continues Hydrofera Blue and a total contact cast. He tells me that his diabetic shoes were lost at the time this wound performed because they were  so can wet. He is also unable to afford anything out of pocket for shoes. The provider he is gone to see is told and that they are not in network with his insurance [employer] 06/16/16; patient was hospitalized from 11/12 through 11/14 with congestive heart failure. Among other things he was found to have a hemoglobin A1c of 12.1. He arrived today had his total contact cast removed. Everything is epithelialized small open area on the lateral aspect of the fifth metatarsal head on the right 06/30/16; the area on the fifth metatarsal head on the right. This is totally epithelialized and closed. This follows a similar pattern on his previous admissions to this clinic. He still does not have diabetic footwear but understands the importance of offloading this area. READMISSION 08/24/16 the patient just got out of hospital this morning rate he was admitted for congestive heart failure he was diuresed. He states over the last week or 2 he is noted callus over the right fifth metatarsal head which is the area is recurrent wounds. He comes in today for [place] to check this. He has not obtained diabetic shoes but was wearing a  boot with felt offloading in this area. He has not had a recent imaging study although I'll need to look through his recent hospitalization. He has not had formal vascular studies recently that I can see. I did a plain x-ray last time he was here in October that did not show osteomyelitis. He has very poorly controlled type 2 diabetes. He also has a subluxed metatarsal head 09/01/16; 7 the patient came last week with a recurrence in the area of his right fifth metatarsal head. This is been the site of recurrent wounds. My thinking last week is that he was likely going to need an MRI. He is a poorly controlled type II diabetic and has a subluxed metatarsal head which contributes to difficulty offloading 09/08/16; patient had what appeared to be a small draining area over the right fifth  metatarsal head. Her intake nurse correctly pointed out that this looked like a blister however with purulent drainage. He was not complaining of pain. He been recently traveling in [place] 09/15/16; the area over the right fifth metatarsal head is fully epithelialized. He had recently been traveling in [place] I think developed a friction injury over this area and subsequently a open wound. This was superficial and not as problematic as other wounds this man has had in the same area. 12/07/16 READMISSION Patient comes in today out of concern for again areas of skin thickening and question ulceration on the plantar aspect of his right fifth metatarsal head. We have been through this several times before and for one reason or another he has never managed to get offloading diabetic shoes. He arrives today and new balance shoes with no insole stating that he forgot but the insole in. In any case he states in the last week his wife noted that he had discoloration and thickened skin over this area and he became concerned and he is here for our review. He tells me works nights at the [employer] in [place]. He is not on his feet however. He has type 2 diabetes with diabetic neuropathy has a prior history of a subluxed metatarsal head #5 12/18/16; patient arrives today with again thick callus over his right fifth metatarsal head. Using a #5 curet I remove this. There is nothing open here. This is simply unrelieved pressure. He has never been able to get custom-made shoes. I referred him to podiatry to help with the latter and also to give [place] a suggestion whether they felt that he surgery on the fifth metatarsal head would be helpful in order to prevent wounds down the road 02/15/17 READMISSION Patient comes in today out of concern for a week's worth of deteriorating condition over the same right fifth plantar had that we've had difficulty with in the past. He is a type II diabetic with diabetic  neuropathy but does not have a history of PAD. I had previously suggested diabetic footwear with custom inserts on multiple occasions and last time he was here I asked that he see podiatry to look at subluxed right fifth metatarsal head to see if surgery in this area might help with recurrent ulcerations. He states he has not have an appointment because they have not called him back. 03/19/17; the patient comes in today with no open wound over the right fifth metatarsal head. He has been using a foam-based dressing that he is purchasing at a medical supply store. He has not received diabetic shoes but to be fair to him there is apparently been  difficulties getting a store to supplied [place]. I'll need to look into this issue. He had a paronychia I on his left hand saw urgent care and was given antibiotics yesterday ====== READMISSION 03/26/18 This patient is a now 49 year old diabetic man who is had recurrent problems with wounds on the right fifth metatarsal head. Last in this clinic I believe in 2018. We have always recommended that he get diabetic custom-made footwear however he has never managed to do so. Nevertheless he tells me that until 2 weeks ago this area was still intact when he noticed a open wound on the same side on the plantar aspect of the right fifth metatarsal head. There is no particular additional feature. He has diabetic peripheral neuropathy and is reasonably insensate. He saw his primary doctor and was given a course of amoxicillin which he just finished. I believe is being using an over-the-counter antibiotic ointment. ABI in this clinic was 1.0. He does not have a known history of PAD 04/02/18; culture I did last week showed abundant Escherichia coli had abundant Enterobacter. We use silver alginate last week not silver collagen as I said. I put him on ciprofloxacin 500 mg by mouth twice a day for 10 days starting today. Plain x-ray was negative for  osteomyelitis., 04/08/18; he continues on the antibiotics that I put him on. Plain x-ray was negative. He arrives today with his wound looking a little larger although that probably is a result of debridement I did last time. Surface of it looks viable although a little dry. We've been using silver alginate 04/15/18; total contact cast but on last week. He arrives today with a smaller opening wound. Using silver alginate 04/22/2018; total contact cast reapplied this week. Wound is smaller. Using silver alginate on the wound bed. 04/29/2018; the patient arrives with the area on the right plantar fifth metatarsal head fully epithelialized and healed. He has is foot wear with the inserts. I have advised him to keep this area padded as well. He has nothing but skin over bone and has had recurrent problems in this area. As usual he heals well with a total contact cast. I have written instructions

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
## 2019-12-08 ENCOUNTER — Encounter (HOSPITAL_BASED_OUTPATIENT_CLINIC_OR_DEPARTMENT_OTHER): Payer: No Typology Code available for payment source | Admitting: Internal Medicine

## 2019-12-08 ENCOUNTER — Ambulatory Visit (INDEPENDENT_AMBULATORY_CARE_PROVIDER_SITE_OTHER): Payer: No Typology Code available for payment source | Admitting: Podiatry

## 2019-12-08 ENCOUNTER — Other Ambulatory Visit: Payer: Self-pay

## 2019-12-08 DIAGNOSIS — L97512 Non-pressure chronic ulcer of other part of right foot with fat layer exposed: Secondary | ICD-10-CM | POA: Diagnosis not present

## 2019-12-08 DIAGNOSIS — E1142 Type 2 diabetes mellitus with diabetic polyneuropathy: Secondary | ICD-10-CM | POA: Diagnosis not present

## 2019-12-08 DIAGNOSIS — T25322D Burn of third degree of left foot, subsequent encounter: Secondary | ICD-10-CM | POA: Diagnosis not present

## 2019-12-08 MED ORDER — TRAMADOL HCL 50 MG PO TABS: 50.0000 mg | ORAL_TABLET | Freq: Three times a day (TID) | ORAL | 0 refills | Status: AC | PRN

## 2019-12-09 ENCOUNTER — Telehealth: Payer: Self-pay | Admitting: Podiatry

## 2019-12-09 NOTE — Telephone Encounter (Signed)
mistake

## 2019-12-09 NOTE — Progress Notes (Signed)
Mitchell [name], Mitchell [name] ([id number]) Visit Report for 12/08/2019 Debridement Details Patient Name: Date of Service: Mitchell [name] 12/08/2019 10:45 A M Medical Record Number: [id number] Patient Account Number: [id number] Date of Birth/Sex: Treating RN: 1970/12/08 (49 y.o. Mitchell [name] Primary Care Provider: Abbe Amsterdam Other Clinician: Referring Provider: Treating Provider/Extender: Bronwen Betters in Treatment: 3 Debridement Performed for Assessment: Wound #11 Left,Dorsal Foot Performed By: Physician Maxwell Caul., MD Debridement Type: Debridement Level of Consciousness (Pre-procedure): Awake and Alert Pre-procedure Verification/Time Out Yes - 11:41 Taken: Start Time: 11:41 T Area Debrided (L x W): otal 0.5 (cm) x 1 (cm) = 0.5 (cm) Tissue and other material debrided: Non-Viable, Callus, Subcutaneous, Skin: Epidermis Level: Skin/Subcutaneous Tissue Debridement Description: Excisional Instrument: Curette Bleeding: Minimum Hemostasis Achieved: Pressure End Time: 11:42 Procedural Pain: 0 Post Procedural Pain: 0 Response to Treatment: Procedure was tolerated well Level of Consciousness (Post- Awake and Alert procedure): Post Debridement Measurements of Total Wound Length: (cm) 0.5 Width: (cm) 1 Depth: (cm) 0.1 Volume: (cm) 0.039 Character of Wound/Ulcer Post Debridement: Improved Post Procedure Diagnosis Same as Pre-procedure Electronic Signature(s) Signed: 12/09/2019 8:11:21 AM By: Baltazar Najjar MD Signed: 12/09/2019 5:17:28 PM By: Zandra Abts RN, BSN Entered By: Baltazar Najjar on 12/08/2019 12:16:27 -------------------------------------------------------------------------------- HPI Details Patient Name: Date of Service: Mitchell [name]. 12/08/2019 10:45 A M Medical Record Number: [id number] Patient Account Number: [id number] Date of Birth/Sex: Treating RN: Jul 09, 1971 (49 y.o. Mitchell [name] Primary Care Provider: Abbe Amsterdam Other Clinician: Referring Provider: Treating Provider/Extender: Bronwen Betters in Treatment: 3 History of Present Illness Location: right fifth metatarsal head on the plantar aspect Quality: Patient reports No Pain. Severity: Patient states wound(s) are getting worse. Duration: Patient has had the wound for < 4 weeks prior to presenting for treatment Context: The wound would happen gradually Modifying Factors: Patient wound(s)/ulcer(s) are worsening due to :unable to get good diabetic shoes and has not seen the podiatrist who Dr. Leanord Hawking referred to him 4 months ago ssociated Signs and Symptoms: Patient reports having increase discharge. A HPI Description: This 49 year old gentleman known to our wound center for the last 2-1/2 years now returns with a recurrent large callus and ulceration under the right fifth metatarsal head which has been draining fluid for at least 4 weeks He is a diabetic with some element of neuropathy but no evidence of peripheral arterial disease and has not had any significant vascular work done in the past. his medical history significant for diabetes mellitus, hypertension, obesity, stroke 3, schizophrenia, nonischemic cardiomyopathy, hyperlipidemia, chronic systolic heart failure.his ejection fraction was about 20% and his diabetes showed hyperglycemia. most recent hemoglobin A1c was 10.3%. he has not been a smoker. patient has not got diabetic shoes nor has he seen the podiatrist, Dr. Leanord Hawking and referred to him 4 months ago. he says his last hemoglobin A1c done most recently was over 12% 06/28/17; again a recurrent wound on the right fifth plantar metatarsal head. Comes in today with callus over the surface. Using a #5 curet he remove this to find a small linear open wound not a healed surface. When he left here several months ago I had recommended consideration of a podiatry consult either for custom-made footwear or consideration  of possible surgery on the subluxed bone in this area. I don't think either was taken care of. 07/06/17; the patient is healed over. For one reason or another he has not managed to get diabetic shoes or custom inserts. He  has been contacted by podiatry, I have referred him there to consider how to adequately offload a subluxed fifth metatarsal head either with offloading devices are shoes or perhaps even with surgery. As usual he heals very quickly if the pressure is offloaded. On this time one week of a total contact cast Of note he would not be a good surgical candidate or at least for general anesthesia. He has a severe cardiomyopathy with an apparent ejection fraction of 20%. He also has poorly controlled type 2 diabetes ====== Old notes: This patient is a type II diabetic on insulin. As far as I can tell he does not have diabetic neuropathy or PAD. He states that 2 weeks ago he noted an ulcer on his right plantar foot. He went to the store but the hydrogen peroxide alcohol and he's been dressing this at home. He has no prior wound history. He has not been systemically unwell.he was seen in urgent care on 6/18. An x-ray suggested an abscess in the soft tissues but no erosive change or bony destruction he was prescribed clindamycin but I don't see that a culture was done. Looking over his problem list he has a history of chronic systolic heart failure with an EF of 20-25% listed as a nonischemic cardiomyopathy. He will not be a candidate for HBO 01/29/15; the wound over his fifth metatarsal head is no better or no worse. There is still an open area that probes to his fifth metatarsal head. An MRI is booked for next Friday. His culture last week grew pansensitive Escherichia coli. His clindamycin has been stopped and he is now on ciprofloxacin. 02/05/15; the wound over his right fifth metatarsal head appears to be improved. Apparently he went to the ER concerned that they have loss over alginate packing  in the wound. He has had his MRI done this shows no evidence of osteomyelitis it does show possible inflammation of the underlying pressure area probably consistent with infection. 02/12/15; the wound over his right fifth metatarsal head continues to improve. He has completed his ciprofloxacin. He is still complaining of wound pain. I suspect he has neuropathy. His MRI did not suggest any evidence of osteomyelitis. 02/18/15; the wound over his right fifth metatarsal head is improved. Debridement with a #15 blade to remove surface slough insert for circumferential callus. the tissue continues to look improved and healthy. 02/26/15. The wound has considerably improved. No debridement is required. The base of this appears to be healthy. 03/19/15 the wound over the right fifth metatarsal head has totally resolved. However I think the area will continue to be an area at risk of. There is not much in the way of subcutaneous tissue and the skn is mostly over his right fifth metatarsal head. I've advised continued offloading of this area using felt with insoles in issues. His primary doctor manages his diabetes and I've encouraged him to ask about ordering diabetic foot wear if his insurance will cover it 05/06/15; the patient returns today with with a wound over the right fifth metatarsal head plantar aspect that. 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 His foot is wet the pressure is prolonged. His daughter noted a wound on his foot today and he is come in for an achievement. He is a diabetic with possible diabetic neuropathy. He doesn't have macrovascular issues. He required a total contact cast for at least a month last time to get closure of this wound. 05/14/15; the wound over the right fifth metatarsal head required  a surgical debridement. After this it cleans up quite nicely and is fairly superficial. There is nothing obviously infected here but the patient is complaining of pain on the lateral  aspect of the wound and is quite tender. 05/21/15 the wound over the right fifth metatarsal head requires a surgical debridement. Post debridement the wound looks quite stable and is fairly superficial although we had the same outcome last week there is no obvious infection here no palpable tenderness. The patient is back at work 06/03/15; the wound is over the right fifth metatarsal head. This required surgical debridement. The wound appears to be stable however I think there is too much pressure here. 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 There is no clear evidence of infection. this patient had an MRI in July 2016 that did not show evidence of soft tissue abscess or osteomyelitis. We managed to heal him out in a total contact cast by and large.we have reordered another x-ray of the bone area given the extent of this wound and the underlying debridement. However I continue to think that this is inadequate offloading 06/11/15 the wound is over the plantar right fifth metatarsal head no debridement was required. He states he is keeping the pressure off this. The base of the wound looks healthy. There is still doubt that this 06/18/15 patient arrived with a linear wound over the plantar right fifth metatarsal head however there was significant undermining. I therefore did a surgical debridement to fully expose the wound 07/02/15; once again the patient arrived with a ragged nonviable surface with a linear wound between. Although this was debridement surprisingly underneath the base of this wound actually looks fairly stable with healthy-looking tissue 07/09/15; the patient arrives today having the wound in almost the same situation as usual. He requires a surgical debridement of callus skin and nonviable subcutaneous tissue. Once again the base of this appears to be healthy. 07/23/15; the patient arrives with circumferential callus and nonviable  subcutaneous tissue around the wound on his plantar right fifth metatarsal head. He requires surgical debridement of callus skin and nonviable subcutaneous tissue. This is roughly in the same status last time. He is going to require a total contact cast, he is simply not able to a float this in any other manner. We have previously healed him with a total contact cast 07/30/15; the patient arrives with a miraculous improvement in the condition of the wound over the right fifth metatarsal head this is fully epithelialized and with application of a total contact cast this week should be healed next week 08/06/15; patient had his total contact cast changed without incident his wound looks almost totally healed. We asked him to bring in issue next week 08/13/15; as predicted the area has totally healed and is totally epithelialized. 09/02/15 the patient returns today having a recurrence in the same area plantar surface of his right fifth metatarsal head. This is surrounded by callus. He had not yet obtained diabetic footwear although he had been to be fitted. He simply saw blood on the floor when he left the shower last night. He states his wife and daughter were keeping an eye in his feet and there was nothing before that. 09/09/15; once again underwent total contact cast the wound is basically down to half the overall circumference as last time. Invasive this appears healthy there is no undermining. He tells me that he was measured for diabetic shoes of  course they're close to $300, he seems to have difficulty with this as [place] healthcare, I'm not sure whether they will cover any part of this. Wound measurements down to 0.3 x 0.3 x 0.2 cm 09/16/15 this patient has a recurrent diabetic foot ulcer over his right fifth metatarsal head. The metatarsal head itself is subluxed. He has not yet obtained his diabetic footwear. MRI does not show osteomyelitis in July/16. He does not have a macrovascular issue. He heals  easily and total contact casting 12/30/15 READMIT the patient is back again with the wound over the right fifth metatarsal head. The metatarsal head itself is known to be subluxed. He ; apparently had a co-pay for diabetic shoes in the high $200 which she could not afford. Apparently the recurrence has been over the last 3 weeks with an open area with a large amount of circumferential callus and necrotic subcutaneous tissue. In the past this is been easily fixed with debridement and a total contact cast 01/07/16; the area over the fifth metatarsal head on the right requires debridement of surface slough and circumferential callus. There is no infection here is culture is negative. In the past this is been easily fixed with debridement and a total contact cast. He has never been able to afford a diabetic footwear prescriptions 01/21/16; area over the fifth metatarsal head on the record on the right is unchanged from last time. This is a bit disappointing. There is no infection. Some circumferential callus that was removed. Will change the dressing from silver alginate to Silver [employer] and and replaced the total contact cast 01/27/16 the area over the fifth metatarsal head is much smaller this week. No infection. We have been using [employer] and under a total contact cast 02/03/16; the area over the fifth metatarsal head continues to be smaller, this week 0.2 x 0.2 x 0.2 we have been using [employer] and under a total contact cast 02/09/16; the area over the fifth metatarsal heads is resolved. I removed some surface eschar. There is no open wound here. I believe there is some subluxation of the fifth metatarsal head. The patient has been reluctant to spend the money necessary to get diabetic footwear. READMISSION 04/20/16; this is a patient I had on 2 different occasions in this clinic both for diabetic ulcers over his right plantar fifth metatarsal head. He does not have known PAD or diabetic  neuropathy. He tells me that greater than 3 weeks ago he was finally able to get his diabetic shoes. Almost that same day he got the shoe wet and that night he had a large blister over the surface of the foot in the same region as his previous wounds. He went to the ER on 04/06/16 the blister was excised. He was given a prescription for Keflex. The patient states that no x-ray was done. It appears that he is just been using wet-to-dry dressings on this. His history is that he requires total contact casting usually to heal these areas even when he is offloaded and are other forms of offloading footwear in the clinic. His diabetes is probably not well controlled in looking through [place] link there is nothing particularly helpful. MRI of the foot in 2016 no osteo. no recent xrays or vasc studies. no recent hgbA1c 04/28/16; continues to have a fairly large wound over the lateral aspect of his right fifth metatarsal head. Wound is debrided with a curet, nonviable skin removed with pickups and scissors below the wound and towards  the plantar fourth and fifth toes. The area feels somewhat boggy but there is no tenderness or warmth. No drainage is noted. He is going to need a plain x-ray. 10/6 last 17; wound dimensions are down somewhat hyper granulation present. X-ray from last week showed soft tissue ulceration lateral to the distal fifth metatarsal. There was no evidence of erosive change or bony destruction. There was some suggestion of erosive arthropathy in the first and second MTP joints. 05/12/16; wound debrided of surface slough and hyper granulation. No evidence of surrounding infection. Wound appears improved at the right fifth metatarsal head 05/19/16; some mild hyper granulation and drainage but no need for debridement. No evidence of surrounding infection. Tissue improved at the right fifth metatarsal head 05/26/16; patient using Hydrofera Blue total contact cast. As usual for this patient  the wound looks excellent 06/01/16; patient continues Hydrofera Blue total contact cast. As usual for this patient is wound looks excellent and is progressing gradually towards closure. 06/08/16; patient continues Hydrofera Blue and a total contact cast. He tells me that his diabetic shoes were lost at the time this wound performed because they were so can wet. He is also unable to afford anything out of pocket for shoes. The provider he is gone to see is told and that they are not in network with his insurance [employer] 06/16/16; patient was hospitalized from 11/12 through 11/14 with congestive heart failure. Among other things he was found to have a hemoglobin A1c of 12.1. He arrived today had his total contact cast removed. Everything is epithelialized small open area on the lateral aspect of the fifth metatarsal head on the right 06/30/16; the area on the fifth metatarsal head on the right. This is totally epithelialized and closed. This follows a similar pattern on his previous admissions to this clinic. He still does not have diabetic footwear but understands the importance of offloading this area. READMISSION 08/24/16 the patient just got out of hospital this morning rate he was admitted for congestive heart failure he was diuresed. He states over the last week or 2 he is noted callus over the right fifth metatarsal head which is the area is recurrent wounds. He comes in today for [place] to check this. He has not obtained diabetic shoes but was wearing a boot with felt offloading in this area. He has not had a recent imaging study although I'll need to look through his recent hospitalization. He has not had formal vascular studies recently that I can see. I did a plain x-ray last time he was here in October that did not show osteomyelitis. He has very poorly controlled type 2 diabetes. He also has a subluxed metatarsal head 09/01/16; 7 the patient came last week with a recurrence in the area of  his right fifth metatarsal head. This is been the site of recurrent wounds. My thinking last week is that he was likely going to need an MRI. He is a poorly controlled type II diabetic and has a subluxed metatarsal head which contributes to difficulty offloading 09/08/16; patient had what appeared to be a small draining area over the right fifth metatarsal head. Her intake nurse correctly pointed out that this looked like a blister however with purulent drainage. He was not complaining of pain. He been recently traveling in [place] 09/15/16; the area over the right fifth metatarsal head is fully epithelialized. He had recently been traveling in [place] I think developed a friction injury over this area and  subsequently a open wound. This was superficial and not as problematic as other wounds this man has had in the same area. 12/07/16 READMISSION Patient comes in today out of concern for again areas of skin thickening and question ulceration on the plantar aspect of his right fifth metatarsal head. We have been through this several times before and for one reason or another he has never managed to get offloading diabetic shoes. He arrives today and new balance shoes with no insole stating that he forgot but the insole in. In any case he states in the last week his wife noted that he had discoloration and thickened skin over this area and he became concerned and he is here for our review. He tells me works nights at the [employer] in [place]. He is not on his feet however. He has type 2 diabetes with diabetic neuropathy has a prior history of a subluxed metatarsal head #5 12/18/16; patient arrives today with again thick callus over his right fifth metatarsal head. Using a #5 curet I remove this. There is nothing open here. This is simply unrelieved pressure. He has never been able to get custom-made shoes. I referred him to podiatry to help with the latter and also to give [place] a suggestion  whether they felt that he surgery on the fifth metatarsal head would be helpful in order to prevent wounds down the road 02/15/17 READMISSION Patient comes in today out of concern for a week's worth of deteriorating condition over the same right fifth plantar had that we've had difficulty with in the past. He is a type II diabetic with diabetic neuropathy but does not have a history of PAD. I had previously suggested diabetic footwear with custom inserts on multiple occasions and last time he was here I asked that he see podiatry to look at subluxed right fifth metatarsal head to see if surgery in this area might help with recurrent ulcerations. He states he has not have an appointment because they have not called him back. 03/19/17; the patient comes in today with no open wound over the right fifth metatarsal head. He has been using a foam-based dressing that he is purchasing at a medical supply store. He has not received diabetic shoes but to be fair to him there is apparently been difficulties getting a store to supplied easing [place]. I'll need to look into this issue. He had a paronychia I on his left hand saw urgent care and was given antibiotics yesterday ====== READMISSION 03/26/18 This patient is a now 49 year old diabetic man who is had recurrent problems with wounds on the right fifth metatarsal head. Last in this clinic I believe in 2018. We have always recommended that he get diabetic custom-made footwear however he has never managed to do so. Nevertheless he tells me that until 2 weeks ago this area was still intact when he noticed a open wound on the same side on the plantar aspect of the right fifth metatarsal head. There is no particular additional feature. He has diabetic peripheral neuropathy and is reasonably insensate. He saw his primary doctor and was given a course of amoxicillin which he just finished. I believe is being using an over-the-counter antibiotic ointment. ABI in  this clinic was 1.0. He does not have a known history of PAD 04/02/18; culture I did last week showed abundant Escherichia coli had abundant Enterobacter. We use silver alginate last week not silver collagen as I said. I put him on ciprofloxacin 500 mg by  mouth twice a day for 10 days starting today. Plain x-ray was negative for osteomyelitis., 04/08/18; he continues on the antibiotics that I put him on. Plain x-ray was negative. He arrives today with his wound looking a little larger although that probably is a result of debridement I did last time. Surface of it looks viable although a little dry. We've been using silver alginate 04/15/18; total contact cast but on last week. He arrives today with a smaller opening wound. Using silver alginate 04/22/2018; total contact cast reapplied this week. Wound is smaller. Using silver alginate on the wound bed. 04/29/2018; the patient arrives with the area on the right plantar fifth metatarsal head fully epithelialized and healed. He has is foot wear with the inserts. I have advised him to keep this area padded as well. He has nothing but skin over bone and has had recurrent problems in this area. As usual he heals well with a total contact cast. I have written instructions that he will be able to go back to work without restrictions on 05/01/2018 05/21/2018; return to clinic This is a patient I discharged less than a month ago. He has recurrent wounds on the right plantar fifth metatarsal head he was fully epithelialized and healed he tells me that 4 days later his daughter was pulling off some padding or perhaps felt off this area and tore off some skin. Since then he has had an open wound. He saw his primary physician 10 days ago and I think was giving 2 weeks of Augmentin and he has another 3 or 4 days of that. He saw Dr. Romero Belling who manages his diabetes and was given a prescription for diabetic shoes at [facility] but he still has not managed to get there. He  has not been systemically unwell. 05/28/2018 MRI of the foot showed osteomyelitis under the wound on the fifth metatarsal head and neck with erosions compatible with osteomyelitis. Culture of the deep area in the lateral part of the wound bed showed Enterobacter, Staphylococcus aureus and Klebsiella. The Staphylococcus is methicillin sensitive. 06/04/2018; the patient continues on Septra and metronidazole. T oday he has exposed bone in the middle of this wound. It would not be impossible to get a bone sample here. I note that he tells me he has an appointment with Dr. Logan Bores of podiatry on the 18th. He has not yet heard from infectious disease. 06/11/2018; the patient will continue on Septra he is completed the metronidazole. He does not have exposed bone in the middle of the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

## 2019-12-09 NOTE — Progress Notes (Signed)
HILL, UNDERDAHL ([id number]) Visit Report for 12/08/2019 Arrival Information Details Patient Name: Date of Service: Mitchell [name] 12/08/2019 10:45 A M Medical Record Number: [id number] Patient Account Number: [id number] Date of Birth/Sex: Treating RN: [birth year] (49 y.o. [name] Primary Care Evgenia Merriman: Abbe [place] Other Clinician: Referring Janeka Libman: Treating Nissim Fleischer/Extender: Bronwen Betters in Treatment: 3 Visit Information History Since Last Visit Added or deleted any medications: No Patient Arrived: Dan Humphreys Any new allergies or adverse reactions: No Arrival Time: 10:59 Had a fall or experienced change in No Accompanied By: self activities of daily living that may affect Transfer Assistance: None risk of falls: Patient Identification Verified: Yes Signs or symptoms of abuse/neglect since last visito No Secondary Verification Process Completed: Yes Hospitalized since last visit: No Patient Requires Transmission-Based Precautions: No Implantable device outside of the clinic excluding No Patient Has Alerts: No cellular tissue based products placed in the center since last visit: Has Dressing in Place as Prescribed: Yes Pain Present Now: No Electronic Signature(s) Signed: 12/09/2019 8:52:48 AM By: Karl Ito Entered By: Karl Ito on 12/08/2019 10:59:58 -------------------------------------------------------------------------------- Encounter Discharge Information Details Patient Name: Date of Service: Mitchell [name]. 12/08/2019 10:45 A M Medical Record Number: [id number] Patient Account Number: [id number] Date of Birth/Sex: Treating RN: [birth year] (49 y.o. [name] Primary Care Avin Upperman: Abbe Amsterdam Other Clinician: Referring Collette Pescador: Treating Niki Payment/Extender: Bronwen Betters in Treatment: 3 Encounter Discharge Information Items Post Procedure Vitals Discharge Condition:  Stable Temperature (F): 98.3 Ambulatory Status: Walker Pulse (bpm): 81 Discharge Destination: Home Respiratory Rate (breaths/min): 19 Transportation: Private Auto Blood Pressure (mmHg): 153/71 Accompanied By: self Schedule Follow-up Appointment: Yes Clinical Summary of Care: Patient Declined Electronic Signature(s) Signed: 12/09/2019 5:14:55 PM By: Cherylin Mylar Entered By: Cherylin Mylar on 12/08/2019 11:56:26 -------------------------------------------------------------------------------- Lower Extremity Assessment Details Patient Name: Date of Service: Mitchell [name] 12/08/2019 10:45 A M Medical Record Number: [id number] Patient Account Number: [id number] Date of Birth/Sex: Treating RN: 05-18-1971 (49 y.o. [name] Primary Care George Haggart: Abbe [place] Other Clinician: Referring Krystena Reitter: Treating Kweli Grassel/Extender: Bronwen Betters in Treatment: 3 Edema Assessment Assessed: Kyra Searles: Yes] Franne Forts: Yes] Edema: [Left: No] [Right: No] Calf Left: Right: Point of Measurement: 44 cm From Medial Instep 41 cm 42 cm Ankle Left: Right: Point of Measurement: 10 cm From Medial Instep 23.5 cm 25 cm Electronic Signature(s) Signed: 12/08/2019 5:18:32 PM By: Shawn Stall Signed: 12/09/2019 5:17:28 PM By: Zandra Abts RN, BSN Entered By: Shawn Stall on 12/08/2019 11:22:49 -------------------------------------------------------------------------------- Multi Wound Chart Details Patient Name: Date of Service: Mitchell [name]. 12/08/2019 10:45 A M Medical Record Number: [id number] Patient Account Number: [id number] Date of Birth/Sex: Treating RN: [birth year] (49 y.o. [name] Primary Care Beckett Maden: Abbe [place] Other Clinician: Referring Murlean Seelye: Treating Margeart Allender/Extender: Bronwen Betters in Treatment: 3 Vital Signs Height(in): 71 Pulse(bpm): 81 Weight(lbs): 298 Blood Pressure(mmHg): 153/71 Body  Mass Index(BMI): 42 Temperature(F): 98.3 Respiratory Rate(breaths/min): 19 Photos: [11:No Photos Left, Dorsal Foot] [14:No Photos Right T Great oe] [15:No Photos Right T Second oe] Wound Location: [11:Thermal Burn] [14:Thermal Burn] [15:Thermal Burn] Wounding Event: [11:3rd degree Burn] [14:3rd degree Burn] [15:3rd degree Burn] Primary Etiology: [11:Glaucoma, Sleep Apnea, Arrhythmia,] [14:Glaucoma, Sleep Apnea, Arrhythmia,] [15:Glaucoma, Sleep Apnea, Arrhythmia,] Comorbid History: [11:Congestive Heart Failure, Hypertension, Myocardial Infarction, Type II Diabetes 10/21/2019] [14:Congestive Heart Failure, Hypertension, Myocardial Infarction, Type II Diabetes 10/21/2019] [15:Congestive Heart Failure, Hypertension,  Myocardial Infarction, Type II Diabetes 10/21/2019] Date Acquired: [11:3] [14:3] [  15:3] Weeks of Treatment: [11:Open] [14:Open] [15:Open] Wound Status: [11:No] [14:No] [15:No] Clustered Wound: [11:N/A] [14:N/A] [15:N/A] Clustered Quantity: [11:0.5x1x0.1] [14:1.3x0.8x0.1] [15:0.8x0.8x0.1] Measurements L x W x D (cm) [11:0.393] [14:0.817] [15:0.503] A (cm) : rea [11:0.039] [14:0.082] [15:0.05] Volume (cm) : [11:94.10%] [14:73.40%] [15:66.10%] % Reduction in Area: [11:94.20%] [14:73.30%] [15:66.20%] % Reduction in Volume: [11:Full Thickness Without Exposed] [14:Full Thickness Without Exposed] [15:Full Thickness Without Exposed] Classification: [11:Support Structures Medium] [14:Support Structures Medium] [15:Support Structures Medium] Exudate A mount: [11:Serosanguineous] [14:Serosanguineous] [15:Serosanguineous] Exudate Type: [11:red, brown] [14:red, brown] [15:red, brown] Exudate Color: [11:Flat and Intact] [14:Distinct, outline attached] [15:Distinct, outline attached] Wound Margin: [11:Large (67-100%)] [14:Medium (34-66%)] [15:Medium (34-66%)] Granulation A mount: [11:Pink, Pale] [14:Pink, Pale] [15:Pink, Pale] Granulation Quality: [11:None Present (0%)] [14:Medium (34-66%)]  [15:Medium (34-66%)] Necrotic A mount: [11:Fat Layer (Subcutaneous Tissue)] [14:Fat Layer (Subcutaneous Tissue)] [15:Fat Layer (Subcutaneous Tissue)] Exposed Structures: [11:Exposed: Yes Fascia: No Tendon: No Muscle: No Joint: No Bone: No Large (67-100%)] [14:Exposed: Yes Fascia: No Tendon: No Muscle: No Joint: No Bone: No Medium (34-66%)] [15:Exposed: Yes Fascia: No Tendon: No Muscle: No Joint: No Bone: No Medium  (34-66%)] Epithelialization: [11:Debridement - Excisional] [14:N/A] [15:N/A] Debridement: Pre-procedure Verification/Time Out 11:41 [14:N/A] [15:N/A] Taken: [11:Callus, Subcutaneous] [14:N/A] [15:N/A] Tissue Debrided: [11:Skin/Subcutaneous Tissue] [14:N/A] [15:N/A] Level: [11:0.5] [14:N/A] [15:N/A] Debridement A (sq cm): [11:rea Curette] [14:N/A] [15:N/A] Instrument: [11:Minimum] [14:N/A] [15:N/A] Bleeding: [11:Pressure] [14:N/A] [15:N/A] Hemostasis A chieved: [11:0] [14:N/A] [15:N/A] Procedural Pain: [11:0] [14:N/A] [15:N/A] Post Procedural Pain: [11:Procedure was tolerated well] [14:N/A] [15:N/A] Debridement Treatment Response: [11:0.5x1x0.1] [14:N/A] [15:N/A] Post Debridement Measurements L x W x D (cm) [11:0.039] [14:N/A] [15:N/A] Post Debridement Volume: (cm) [11:Debridement] [14:N/A] [15:N/A] Wound Number: 16 N/A N/A Photos: No Photos N/A N/A Right, Medial Foot N/A N/A Wound Location: Thermal Burn N/A N/A Wounding Event: 3rd degree Burn N/A N/A Primary Etiology: Glaucoma, Sleep Apnea, Arrhythmia, N/A N/A Comorbid History: Congestive Heart Failure, Hypertension, Myocardial Infarction, Type II Diabetes 10/21/2019 N/A N/A Date A cquired: 3 N/A N/A Weeks of Treatment: Open N/A N/A Wound Status: Yes N/A N/A Clustered Wound: 1 N/A N/A Clustered Quantity: 0.1x0.1x0.1 N/A N/A Measurements L x W x D (cm) 0.008 N/A N/A A (cm) : rea 0.001 N/A N/A Volume (cm) : 99.90% N/A N/A % Reduction in A rea: 99.90% N/A N/A % Reduction in Volume: Full Thickness  Without Exposed N/A N/A Classification: Support Structures None Present N/A N/A Exudate A mount: N/A N/A N/A Exudate Type: N/A N/A N/A Exudate Color: Distinct, outline attached N/A N/A Wound Margin: None Present (0%) N/A N/A Granulation A mount: N/A N/A N/A Granulation Quality: None Present (0%) N/A N/A Necrotic A mount: Fat Layer (Subcutaneous Tissue) N/A N/A Exposed Structures: Exposed: Yes Fascia: No Tendon: No Muscle: No Joint: No Bone: No Small (1-33%) N/A N/A Epithelialization: N/A N/A N/A Debridement: N/A N/A N/A Tissue Debrided: N/A N/A N/A Level: N/A N/A N/A Debridement A (sq cm): rea N/A N/A N/A Instrument: N/A N/A N/A Bleeding: N/A N/A N/A Hemostasis A chieved: N/A N/A N/A Procedural Pain: N/A N/A N/A Post Procedural Pain: Debridement Treatment Response: N/A N/A N/A Post Debridement Measurements L x N/A N/A N/A W x D (cm) N/A N/A N/A Post Debridement Volume: (cm) N/A N/A N/A Procedures Performed: Treatment Notes Wound #11 (Left, Dorsal Foot) 1. Cleanse With Wound Cleanser 3. Primary Dressing Applied [employer] 4. Secondary Dressing Dry Gauze Roll Gauze 5. Secured With Tape Wound #14 (Right Toe Great) 1. Cleanse With Wound Cleanser 3. Primary Dressing Applied [employer] 4. Secondary Dressing Dry Gauze Roll Gauze 5.  Secured With Tape Wound #15 (Right Toe Second) 1. Cleanse With Wound Cleanser 3. Primary Dressing Applied [employer] 4. Secondary Dressing Dry Gauze Roll Gauze 5. Secured With Tape Wound #16 (Right, Medial Foot) 1. Cleanse With Wound Cleanser 3. Primary Dressing Applied [employer] 4. Secondary Dressing Dry Gauze Roll Gauze 5. Secured With Secretary/administrator) Signed: 12/09/2019 8:11:21 AM By: Baltazar Najjar MD Signed: 12/09/2019 5:17:28 PM By: Zandra Abts RN, BSN Entered By: Baltazar Najjar on 12/08/2019  12:16:13 -------------------------------------------------------------------------------- Multi-Disciplinary Care Plan Details Patient Name: Date of Service: Mitchell [name]. 12/08/2019 10:45 A M Medical Record Number: [id number] Patient Account Number: [id number] Date of Birth/Sex: Treating RN: May 28, 1971 (49 y.o. [name] Primary Care Aidin Doane: Abbe [place] Other Clinician: Referring Alyze Lauf: Treating Dsean Vantol/Extender: Bronwen Betters in Treatment: 3 Active Inactive Nutrition Nursing Diagnoses: Impaired glucose control: actual or potential Potential for alteratiion in Nutrition/Potential for imbalanced nutrition Goals: Patient/caregiver agrees to and verbalizes understanding of need to use nutritional supplements and/or vitamins as prescribed Date Initiated: 11/17/2019 Target Resolution Date: 12/19/2019 Goal Status: Active Patient/caregiver verbalizes understanding of need to maintain therapeutic glucose control per primary care physician Date Initiated: 11/17/2019 Target Resolution Date: 12/19/2019 Goal Status: Active Interventions: Assess HgA1c results as ordered upon admission and as needed Assess patient nutrition upon admission and as needed per policy Provide education on elevated blood sugars and impact on wound healing Provide education on nutrition Treatment Activities: Education provided on Nutrition : 11/17/2019 Notes: Wound/Skin Impairment Nursing Diagnoses: Impaired tissue integrity Knowledge deficit related to ulceration/compromised skin integrity Goals: Patient/caregiver will verbalize understanding of skin care regimen Date Initiated: 11/17/2019 Target Resolution Date: 12/19/2019 Goal Status: Active Ulcer/skin breakdown will have a volume reduction of 30% by week 4 Date Initiated: 11/17/2019 Target Resolution Date: 12/19/2019 Goal Status: Active Interventions: Assess patient/caregiver ability to obtain necessary  supplies Assess patient/caregiver ability to perform ulcer/skin care regimen upon admission and as needed Assess ulceration(s) every visit Provide education on ulcer and skin care Notes: Electronic Signature(s) Signed: 12/09/2019 5:17:28 PM By: Zandra Abts RN, BSN Entered By: Zandra Abts on 12/08/2019 11:06:10 -------------------------------------------------------------------------------- Pain Assessment Details Patient Name: Date of Service: Mitchell [name]. 12/08/2019 10:45 A M Medical Record Number: [id number] Patient Account Number: [id number] Date of Birth/Sex: Treating RN: Feb 04, 1971 (49 y.o. [name] Primary Care Mikinzie Maciejewski: Abbe [place] Other Clinician: Referring Nyelli Samara: Treating Dolce Sylvia/Extender: Bronwen Betters in Treatment: 3 Active Problems Location of Pain Severity and Description of Pain Patient Has Paino No Site Locations Pain Management and Medication Current Pain Management: Electronic Signature(s) Signed: 12/09/2019 8:52:48 AM By: Karl Ito Signed: 12/09/2019 5:17:28 PM By: Zandra Abts RN, BSN Entered By: Karl Ito on 12/08/2019 11:00:24 -------------------------------------------------------------------------------- Patient/Caregiver Education Details Patient Name: Date of Service: Mitchell [name] 5/10/2021andnbsp10:45 A M Medical Record Number: [id number] Patient Account Number: [id number] Date of Birth/Gender: Treating RN: May 31, 1971 (49 y.o. [name] Primary Care Physician: Abbe Amsterdam Other Clinician: Referring Physician: Treating Physician/Extender: Bronwen Betters in Treatment: 3 Education Assessment Education Provided To: Patient Education Topics Provided Wound/Skin Impairment: Methods: Explain/Verbal Responses: State content correctly Electronic Signature(s) Signed: 12/09/2019 5:17:28 PM By: Zandra Abts RN, BSN Entered By: Zandra Abts  on 12/08/2019 11:06:25 -------------------------------------------------------------------------------- Wound Assessment Details Patient Name: Date of Service: Mitchell [name] 12/08/2019 10:45 A M Medical Record Number: [id number] Patient Account Number: [id number] Date of Birth/Sex: Treating RN: May 29, 1971 (49 y.o. [name] Primary Care Rexanna Louthan: Abbe Amsterdam  Other Clinician: Referring Abena Erdman: Treating Annalysia Willenbring/Extender: Delton See in Treatment: 3 Wound Status Wound Number: 11 Primary 3rd degree Burn Etiology: Wound Location: Left, Dorsal Foot Wound Open Wounding Event: Thermal Burn Status: Date Acquired: 10/21/2019 Comorbid Glaucoma, Sleep Apnea, Arrhythmia, Congestive Heart Failure, Weeks Of Treatment: 3 History: Hypertension, Myocardial Infarction, Type II Diabetes Clustered Wound: No Photos Photo Uploaded By: Mikeal Hawthorne on 12/09/2019 10:54:13 Wound Measurements Length: (cm) 0.5 Width: (cm) 1 Depth: (cm) 0.1 Area: (cm) 0.393 Volume: (cm) 0.039 % Reduction in Area: 94.1% % Reduction in Volume: 94.2% Epithelialization: Large (67-100%) Tunneling: No Undermining: No Wound Description Classification: Full Thickness Without Exposed Support Structures Wound Margin: Flat and Intact Exudate Amount: Medium Exudate Type: Serosanguineous Exudate Color: red, brown Foul Odor After Cleansing: No Slough/Fibrino No Wound Bed Granulation Amount: Large (67-100%) Exposed Structure Granulation Quality: Pink, Pale Fascia Exposed: No Necrotic Amount: None Present (0%) Fat Layer (Subcutaneous Tissue) Exposed: Yes Tendon Exposed: No Muscle Exposed: No Joint Exposed: No Bone Exposed: No Treatment Notes Wound #11 (Left, Dorsal Foot) 1. Cleanse With Wound Cleanser 3. Primary Dressing Applied [employer] 4. Secondary Dressing Dry Gauze Roll Gauze 5. Secured With Recruitment consultant) Signed: 12/08/2019 5:18:32 PM By:  Deon Pilling Signed: 12/09/2019 5:17:28 PM By: Levan Hurst RN, BSN Entered By: Deon Pilling on 12/08/2019 11:24:02 -------------------------------------------------------------------------------- Wound Assessment Details Patient Name: Date of Service: Soledad Gerlach. 12/08/2019 10:45 A M Medical Record Number: [id number] Patient Account Number: [id number] Date of Birth/Sex: Treating RN: 07-10-1971 (49 y.o. Janyth Contes Primary Care Yuna Pizzolato: [name] Other Clinician: Referring Arionna Hoggard: Treating Parisha Beaulac/Extender: Delton See in Treatment: 3 Wound Status Wound Number: 14 Primary 3rd degree Burn Etiology: Wound Location: Right T Great oe Wound Open Wounding Event: Thermal Burn Status: Date Acquired: 10/21/2019 Comorbid Glaucoma, Sleep Apnea, Arrhythmia, Congestive Heart Failure, Weeks Of Treatment: 3 History: Hypertension, Myocardial Infarction, Type II Diabetes Clustered Wound: No Photos Photo Uploaded By: Mikeal Hawthorne on 12/09/2019 10:47:20 Wound Measurements Length: (cm) 1.3 Width: (cm) 0.8 Depth: (cm) 0.1 Area: (cm) 0.817 Volume: (cm) 0.082 % Reduction in Area: 73.4% % Reduction in Volume: 73.3% Epithelialization: Medium (34-66%) Tunneling: No Undermining: No Wound Description Classification: Full Thickness Without Exposed Support Structures Wound Margin: Distinct, outline attached Exudate Amount: Medium Exudate Type: Serosanguineous Exudate Color: red, brown Foul Odor After Cleansing: No Slough/Fibrino Yes Wound Bed Granulation Amount: Medium (34-66%) Exposed Structure Granulation Quality: Pink, Pale Fascia Exposed: No Necrotic Amount: Medium (34-66%) Fat Layer (Subcutaneous Tissue) Exposed: Yes Necrotic Quality: Adherent Slough Tendon Exposed: No Muscle Exposed: No Joint Exposed: No Bone Exposed: No Treatment Notes Wound #14 (Right Toe Great) 1. Cleanse With Wound Cleanser 3. Primary Dressing  Applied [employer] 4. Secondary Dressing Dry Gauze Roll Gauze 5. Secured With Recruitment consultant) Signed: 12/08/2019 5:18:32 PM By: Deon Pilling Signed: 12/09/2019 5:17:28 PM By: Levan Hurst RN, BSN Entered By: Deon Pilling on 12/08/2019 11:24:30 -------------------------------------------------------------------------------- Wound Assessment Details Patient Name: Date of Service: Soledad Gerlach. 12/08/2019 10:45 A M Medical Record Number: [id number] Patient Account Number: [id number] Date of Birth/Sex: Treating RN: 06-Apr-1971 (49 y.o. Janyth Contes Primary Care Ein Rijo: [name] Other Clinician: Referring Lorie Melichar: Treating Juanangel Soderholm/Extender: Delton See in Treatment: 3 Wound Status Wound Number: 15 Primary 3rd degree Burn Etiology: Wound Location: Right T Second oe Wound Open Wounding Event: Thermal Burn Status: Date Acquired: 10/21/2019 Comorbid Glaucoma, Sleep Apnea, Arrhythmia, Congestive Heart Failure, Weeks Of Treatment: 3 History: Hypertension, Myocardial Infarction,  Type II Diabetes Clustered Wound: No Photos Photo Uploaded By: Benjaman Kindler on 12/09/2019 10:47:21 Wound Measurements Length: (cm) 0.8 Width: (cm) 0.8 Depth: (cm) 0.1 Area: (cm) 0.503 Volume: (cm) 0.05 % Reduction in Area: 66.1% % Reduction in Volume: 66.2% Epithelialization: Medium (34-66%) Tunneling: No Undermining: No Wound Description Classification: Full Thickness Without Exposed Support Structures Wound Margin: Distinct, outline attached Exudate Amount: Medium Exudate Type: Serosanguineous Exudate Color: red, brown Foul Odor After Cleansing: No Slough/Fibrino Yes Wound Bed Granulation Amount: Medium (34-66%) Exposed Structure Granulation Quality: Pink, Pale Fascia Exposed: No Necrotic Amount: Medium (34-66%) Fat Layer (Subcutaneous Tissue) Exposed: Yes Necrotic Quality: Adherent Slough Tendon Exposed: No Muscle  Exposed: No Joint Exposed: No Bone Exposed: No Treatment Notes Wound #15 (Right Toe Second) 1. Cleanse With Wound Cleanser 3. Primary Dressing Applied [employer] 4. Secondary Dressing Dry Gauze Roll Gauze 5. Secured With Secretary/administrator) Signed: 12/08/2019 5:18:32 PM By: Shawn Stall Signed: 12/09/2019 5:17:28 PM By: Zandra Abts RN, BSN Entered By: Shawn Stall on 12/08/2019 11:24:45 -------------------------------------------------------------------------------- Wound Assessment Details Patient Name: Date of Service: Mitchell [name]. 12/08/2019 10:45 A M Medical Record Number: [id number] Patient Account Number: [id number] Date of Birth/Sex: Treating RN: May 22, 1971 (49 y.o. [name] Primary Care Carrera Kiesel: Abbe [place] Other Clinician: Referring Akyla Vavrek: Treating Izea Livolsi/Extender: Bronwen Betters in Treatment: 3 Wound Status Wound Number: 16 Primary 3rd degree Burn Etiology: Wound Location: Right, Medial Foot Wound Open Wounding Event: Thermal Burn Status: Date Acquired: 10/21/2019 Comorbid Glaucoma, Sleep Apnea, Arrhythmia, Congestive Heart Failure, Weeks Of Treatment: 3 History: Hypertension, Myocardial Infarction, Type II Diabetes Clustered Wound: Yes Wound Measurements Length: (cm) 0.1 Width: (cm) 0.1 Depth: (cm) 0.1 Clustered Quantity: 1 Area: (cm) 0.008 Volume: (cm) 0.001 % Reduction in Area: 99.9% % Reduction in Volume: 99.9% Epithelialization: Small (1-33%) Tunneling: No Undermining: No Wound Description Classification: Full Thickness Without Exposed Support Structures Wound Margin: Distinct, outline attached Exudate Amount: None Present Foul Odor After Cleansing: No Slough/Fibrino No Wound Bed Granulation Amount: None Present (0%) Exposed Structure Necrotic Amount: None Present (0%) Fascia Exposed: No Fat Layer (Subcutaneous Tissue) Exposed: Yes Tendon Exposed: No Muscle Exposed:  No Joint Exposed: No Bone Exposed: No Treatment Notes Wound #16 (Right, Medial Foot) 1. Cleanse With Wound Cleanser 3. Primary Dressing Applied [employer] 4. Secondary Dressing Dry Gauze Roll Gauze 5. Secured With Secretary/administrator) Signed: 12/08/2019 5:18:32 PM By: Shawn Stall Signed: 12/09/2019 5:17:28 PM By: Zandra Abts RN, BSN Entered By: Shawn Stall on 12/08/2019 11:25:16 -------------------------------------------------------------------------------- Vitals Details Patient Name: Date of Service: Mitchell [name]. 12/08/2019 10:45 A M Medical Record Number: [id number] Patient Account Number: [id number] Date of Birth/Sex: Treating RN: [birth year] (49 y.o. [name] Primary Care Raygan Skarda: Abbe [place] Other Clinician: Referring Maciah Feeback: Treating Quinlynn Cuthbert/Extender: Bronwen Betters in Treatment: 3 Vital Signs Time Taken: 10:59 Temperature (F): 98.3 Height (in): 71 Pulse (bpm): 81 Weight (lbs): 298 Respiratory Rate (breaths/min): 19 Body Mass Index (BMI): 41.6 Blood Pressure (mmHg): 153/71 Reference Range: 80 - 120 mg / dl Electronic Signature(s) Signed: 12/09/2019 8:52:48 AM By: Karl Ito Entered By: Karl Ito on 12/08/2019 11:00:17
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
## 2019-12-15 ENCOUNTER — Encounter (HOSPITAL_BASED_OUTPATIENT_CLINIC_OR_DEPARTMENT_OTHER): Payer: No Typology Code available for payment source | Admitting: Internal Medicine

## 2019-12-15 DIAGNOSIS — T25322D Burn of third degree of left foot, subsequent encounter: Secondary | ICD-10-CM | POA: Diagnosis not present
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
## 2019-12-15 NOTE — Progress Notes (Signed)
KALEEL, SCHMIEDER ([id number]) Visit Report for 12/15/2019 Debridement Details Patient Name: Date of Service: Mitchell [name] 12/15/2019 9:15 A M Medical Record Number: [id number] Patient Account Number: [id number] Date of Birth/Sex: Treating RN: [birth year] (49 y.o. [name] Primary Care Provider: Abbe Amsterdam Other Clinician: Referring Provider: Treating Provider/Extender: Bronwen Betters in Treatment: 4 Debridement Performed for Assessment: Wound #14 Right T Great oe Performed By: Physician Maxwell Caul., MD Debridement Type: Debridement Level of Consciousness (Pre-procedure): Awake and Alert Pre-procedure Verification/Time Out Yes - 10:26 Taken: Start Time: 10:27 T Area Debrided (L x W): otal 1.2 (cm) x 0.7 (cm) = 0.84 (cm) Tissue and other material debrided: Viable, Non-Viable, Callus, Subcutaneous Level: Skin/Subcutaneous Tissue Debridement Description: Excisional Instrument: Curette Bleeding: Minimum Hemostasis Achieved: Pressure End Time: 10:28 Procedural Pain: 0 Post Procedural Pain: 0 Response to Treatment: Procedure was tolerated well Level of Consciousness (Post- Awake and Alert procedure): Post Debridement Measurements of Total Wound Length: (cm) 1.2 Width: (cm) 0.7 Depth: (cm) 0.1 Volume: (cm) 0.066 Character of Wound/Ulcer Post Debridement: Improved Post Procedure Diagnosis Same as Pre-procedure Electronic Signature(s) Signed: 12/15/2019 4:51:55 PM By: Baltazar Najjar MD Signed: 12/15/2019 5:54:52 PM By: Zandra Abts RN, BSN Entered By: Baltazar Najjar on 12/15/2019 10:45:36 -------------------------------------------------------------------------------- Debridement Details Patient Name: Date of Service: Mitchell Alpers L. 12/15/2019 9:15 A M Medical Record Number: [id number] Patient Account Number: [id number] Date of Birth/Sex: Treating RN: 30-May-1971 (49 y.o. [name] Primary Care Provider: Abbe Amsterdam Other Clinician: Referring Provider: Treating Provider/Extender: Bronwen Betters in Treatment: 4 Debridement Performed for Assessment: Wound #15 Right T Second oe Performed By: Physician Maxwell Caul., MD Debridement Type: Debridement Level of Consciousness (Pre-procedure): Awake and Alert Pre-procedure Verification/Time Out Yes - 10:26 Taken: Start Time: 10:26 T Area Debrided (L x W): otal 0.5 (cm) x 0.5 (cm) = 0.25 (cm) Tissue and other material debrided: Viable, Non-Viable, Callus, Subcutaneous Level: Skin/Subcutaneous Tissue Debridement Description: Excisional Instrument: Curette Bleeding: Minimum Hemostasis Achieved: Pressure End Time: 10:27 Procedural Pain: 0 Post Procedural Pain: 0 Response to Treatment: Procedure was tolerated well Level of Consciousness (Post- Awake and Alert procedure): Post Debridement Measurements of Total Wound Length: (cm) 0.5 Width: (cm) 0.5 Depth: (cm) 0.1 Volume: (cm) 0.02 Character of Wound/Ulcer Post Debridement: Improved Post Procedure Diagnosis Same as Pre-procedure Electronic Signature(s) Signed: 12/15/2019 4:51:55 PM By: Baltazar Najjar MD Signed: 12/15/2019 5:54:52 PM By: Zandra Abts RN, BSN Entered By: Baltazar Najjar on 12/15/2019 10:45:48 -------------------------------------------------------------------------------- HPI Details Patient Name: Date of Service: Mitchell Alpers L. 12/15/2019 9:15 A M Medical Record Number: [id number] Patient Account Number: [id number] Date of Birth/Sex: Treating RN: Jun 24, 1971 (49 y.o. [name] Primary Care Provider: Abbe Amsterdam Other Clinician: Referring Provider: Treating Provider/Extender: Bronwen Betters in Treatment: 4 History of Present Illness Location: right fifth metatarsal head on the plantar aspect Quality: Patient reports No Pain. Severity: Patient states wound(s) are getting worse. Duration: Patient  has had the wound for < 4 weeks prior to presenting for treatment Context: The wound would happen gradually Modifying Factors: Patient wound(s)/ulcer(s) are worsening due to :unable to get good diabetic shoes and has not seen the podiatrist who Dr. Leanord Hawking referred to him 4 months ago ssociated Signs and Symptoms: Patient reports having increase discharge. A HPI Description: This 49 year old gentleman known to our wound center for the last 2-1/2 years now returns with a recurrent large callus and ulceration under the right fifth metatarsal head which  has been draining fluid for at least 4 weeks He is a diabetic with some element of neuropathy but no evidence of peripheral arterial disease and has not had any significant vascular work done in the past. his medical history significant for diabetes mellitus, hypertension, obesity, stroke 3, schizophrenia, nonischemic cardiomyopathy, hyperlipidemia, chronic systolic heart failure.his ejection fraction was about 20% and his diabetes showed hyperglycemia. most recent hemoglobin A1c was 10.3%. he has not been a smoker. patient has not got diabetic shoes nor has he seen the podiatrist, Dr. Leanord Hawking and referred to him 4 months ago. he says his last hemoglobin A1c done most recently was over 12% 06/28/17; again a recurrent wound on the right fifth plantar metatarsal head. Comes in today with callus over the surface. Using a #5 curet he remove this to find a small linear open wound not a healed surface. When he left here several months ago I had recommended consideration of a podiatry consult either for custom-made footwear or consideration of possible surgery on the subluxed bone in this area. I don't think either was taken care of. 07/06/17; the patient is healed over. For one reason or another he has not managed to get diabetic shoes or custom inserts. He has been contacted by podiatry, I have referred him there to consider how to adequately offload a  subluxed fifth metatarsal head either with offloading devices are shoes or perhaps even with surgery. As usual he heals very quickly if the pressure is offloaded. On this time one week of a total contact cast Of note he would not be a good surgical candidate or at least for general anesthesia. He has a severe cardiomyopathy with an apparent ejection fraction of 20%. He also has poorly controlled type 2 diabetes ====== Old notes: This patient is a type II diabetic on insulin. As far as I can tell he does not have diabetic neuropathy or PAD. He states that 2 weeks ago he noted an ulcer on his right plantar foot. He went to the store but the hydrogen peroxide alcohol and he's been dressing this at home. He has no prior wound history. He has not been systemically unwell.he was seen in urgent care on 6/18. An x-ray suggested an abscess in the soft tissues but no erosive change or bony destruction he was prescribed clindamycin but I don't see that a culture was done. Looking over his problem list he has a history of chronic systolic heart failure with an EF of 20-25% listed as a nonischemic cardiomyopathy. He will not be a candidate for HBO 01/29/15; the wound over his fifth metatarsal head is no better or no worse. There is still an open area that probes to his fifth metatarsal head. An MRI is booked for next Friday. His culture last week grew pansensitive Escherichia coli. His clindamycin has been stopped and he is now on ciprofloxacin. 02/05/15; the wound over his right fifth metatarsal head appears to be improved. Apparently he went to the ER concerned that they have loss over alginate packing in the wound. He has had his MRI done this shows no evidence of osteomyelitis it does show possible inflammation of the underlying pressure area probably consistent with infection. 02/12/15; the wound over his right fifth metatarsal head continues to improve. He has completed his ciprofloxacin. He is still complaining  of wound pain. I suspect he has neuropathy. His MRI did not suggest any evidence of osteomyelitis. 02/18/15; the wound over his right fifth metatarsal head is improved. Debridement with  a #15 blade to remove surface slough insert for circumferential callus. the tissue continues to look improved and healthy. 02/26/15. The wound has considerably improved. No debridement is required. The base of this appears to be healthy. 03/19/15 the wound over the right fifth metatarsal head has totally resolved. However I think the area will continue to be an area at risk of. There is not much in the way of subcutaneous tissue and the skn is mostly over his right fifth metatarsal head. I've advised continued offloading of this area using felt with insoles in issues. His primary doctor manages his diabetes and I've encouraged him to ask about ordering diabetic foot wear if his insurance will cover it 05/06/15; the patient returns today with with a wound over the right fifth metatarsal head plantar aspect that. 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 His foot is wet the pressure is prolonged. His daughter noted a wound on his foot today and he is come in for an achievement. He is a diabetic with possible diabetic neuropathy. He doesn't have macrovascular issues. He required a total contact cast for at least a month last time to get closure of this wound. 05/14/15; the wound over the right fifth metatarsal head required a surgical debridement. After this it cleans up quite nicely and is fairly superficial. There is nothing obviously infected here but the patient is complaining of pain on the lateral aspect of the wound and is quite tender. 05/21/15 the wound over the right fifth metatarsal head requires a surgical debridement. Post debridement the wound looks quite stable and is fairly superficial although we had the same outcome last week there is no obvious infection here no palpable tenderness. The patient is back  at work 06/03/15; the wound is over the right fifth metatarsal head. This required surgical debridement. The wound appears to be stable however I think there is too much pressure here. 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 There is no clear evidence of infection. this patient had an MRI in July 2016 that did not show evidence of soft tissue abscess or osteomyelitis. We managed to heal him out in a total contact cast by and large.we have reordered another x-ray of the bone area given the extent of this wound and the underlying debridement. However I continue to think that this is inadequate offloading 06/11/15 the wound is over the plantar right fifth metatarsal head no debridement was required. He states he is keeping the pressure off this. The base of the wound looks healthy. There is still doubt that this 06/18/15 patient arrived with a linear wound over the plantar right fifth metatarsal head however there was significant undermining. I therefore did a surgical debridement to fully expose the wound 07/02/15; once again the patient arrived with a ragged nonviable surface with a linear wound between. Although this was debridement surprisingly underneath the base of this wound actually looks fairly stable with healthy-looking tissue 07/09/15; the patient arrives today having the wound in almost the same situation as usual. He requires a surgical debridement of callus skin and nonviable subcutaneous tissue. Once again the base of this appears to be healthy. 07/23/15; the patient arrives with circumferential callus and nonviable subcutaneous tissue around the wound on his plantar right fifth metatarsal head. He requires surgical debridement of callus skin and nonviable subcutaneous tissue. This is roughly in the same status last time. He is going to require a total contact  cast, he is simply not able to a float this in any other manner. We have  previously healed him with a total contact cast 07/30/15; the patient arrives with a miraculous improvement in the condition of the wound over the right fifth metatarsal head this is fully epithelialized and with application of a total contact cast this week should be healed next week 08/06/15; patient had his total contact cast changed without incident his wound looks almost totally healed. We asked him to bring in issue next week 08/13/15; as predicted the area has totally healed and is totally epithelialized. 09/02/15 the patient returns today having a recurrence in the same area plantar surface of his right fifth metatarsal head. This is surrounded by callus. He had not yet obtained diabetic footwear although he had been to be fitted. He simply saw blood on the floor when he left the shower last night. He states his wife and daughter were keeping an eye in his feet and there was nothing before that. 09/09/15; once again underwent total contact cast the wound is basically down to half the overall circumference as last time. Invasive this appears healthy there is no undermining. He tells me that he was measured for diabetic shoes of course they're close to $300, he seems to have difficulty with this as [place] [employer], I'm not sure whether they will cover any part of this. Wound measurements down to 0.3 x 0.3 x 0.2 cm 09/16/15 this patient has a recurrent diabetic foot ulcer over his right fifth metatarsal head. The metatarsal head itself is subluxed. He has not yet obtained his diabetic footwear. MRI does not show osteomyelitis in July/16. He does not have a macrovascular issue. He heals easily and total contact casting 12/30/15 READMIT the patient is back again with the wound over the right fifth metatarsal head. The metatarsal head itself is known to be subluxed. He ; apparently had a co-pay for diabetic shoes in the high $200 which she could not afford. Apparently the recurrence has been over the last 3  weeks with an open area with a large amount of circumferential callus and necrotic subcutaneous tissue. In the past this is been easily fixed with debridement and a total contact cast 01/07/16; the area over the fifth metatarsal head on the right requires debridement of surface slough and circumferential callus. There is no infection here is culture is negative. In the past this is been easily fixed with debridement and a total contact cast. He has never been able to afford a diabetic footwear prescriptions 01/21/16; area over the fifth metatarsal head on the record on the right is unchanged from last time. This is a bit disappointing. There is no infection. Some circumferential callus that was removed. Will change the dressing from silver alginate to Silver [employer] and and replaced the total contact cast 01/27/16 the area over the fifth metatarsal head is much smaller this week. No infection. We have been using [employer] and under a total contact cast 02/03/16; the area over the fifth metatarsal head continues to be smaller, this week 0.2 x 0.2 x 0.2 we have been using [employer] and under a total contact cast 02/09/16; the area over the fifth metatarsal heads is resolved. I removed some surface eschar. There is no open wound here. I believe there is some subluxation of the fifth metatarsal head. The patient has been reluctant to spend the money necessary to get diabetic footwear. READMISSION 04/20/16; this is a patient I had on 2  different occasions in this clinic both for diabetic ulcers over his right plantar fifth metatarsal head. He does not have known PAD or diabetic neuropathy. He tells me that greater than 3 weeks ago he was finally able to get his diabetic shoes. Almost that same day he got the shoe wet and that night he had a large blister over the surface of the foot in the same region as his previous wounds. He went to the ER on 04/06/16 the blister was excised. He was given a  prescription for Keflex. The patient states that no x-ray was done. It appears that he is just been using wet-to-dry dressings on this. His history is that he requires total contact casting usually to heal these areas even when he is offloaded and are other forms of offloading footwear in the clinic. His diabetes is probably not well controlled in looking through [facility] there is nothing particularly helpful. MRI of the foot in 2016 no osteo. no recent xrays or vasc studies. no recent hgbA1c 04/28/16; continues to have a fairly large wound over the lateral aspect of his right fifth metatarsal head. Wound is debrided with a curet, nonviable skin removed with pickups and scissors below the wound and towards the plantar fourth and fifth toes. The area feels somewhat boggy but there is no tenderness or warmth. No drainage is noted. He is going to need a plain x-ray. 10/6 last 17; wound dimensions are down somewhat hyper granulation present. X-ray from last week showed soft tissue ulceration lateral to the distal fifth metatarsal. There was no evidence of erosive change or bony destruction. There was some suggestion of erosive arthropathy in the first and second MTP joints. 05/12/16; wound debrided of surface slough and hyper granulation. No evidence of surrounding infection. Wound appears improved at the right fifth metatarsal head 05/19/16; some mild hyper granulation and drainage but no need for debridement. No evidence of surrounding infection. Tissue improved at the right fifth metatarsal head 05/26/16; patient using Hydrofera Blue total contact cast. As usual for this patient the wound looks excellent 06/01/16; patient continues Hydrofera Blue total contact cast. As usual for this patient is wound looks excellent and is progressing gradually towards closure. 06/08/16; patient continues Hydrofera Blue and a total contact cast. He tells me that his diabetic shoes were lost at the time this wound  performed because they were so can wet. He is also unable to afford anything out of pocket for shoes. The provider he is gone to see is told and that they are not in network with his insurance [employer] 06/16/16; patient was hospitalized from 11/12 through 11/14 with congestive heart failure. Among other things he was found to have a hemoglobin A1c of 12.1. He arrived today had his total contact cast removed. Everything is epithelialized small open area on the lateral aspect of the fifth metatarsal head on the right 06/30/16; the area on the fifth metatarsal head on the right. This is totally epithelialized and closed. This follows a similar pattern on his previous admissions to this clinic. He still does not have diabetic footwear but understands the importance of offloading this area. READMISSION 08/24/16 the patient just got out of hospital this morning rate he was admitted for congestive heart failure he was diuresed. He states over the last week or 2 he is noted callus over the right fifth metatarsal head which is the area is recurrent wounds. He comes in today for [place] to check this. He has not  obtained diabetic shoes but was wearing a boot with felt offloading in this area. He has not had a recent imaging study although I'll need to look through his recent hospitalization. He has not had formal vascular studies recently that I can see. I did a plain x-ray last time he was here in October that did not show osteomyelitis. He has very poorly controlled type 2 diabetes. He also has a subluxed metatarsal head 09/01/16; 7 the patient came last week with a recurrence in the area of his right fifth metatarsal head. This is been the site of recurrent wounds. My thinking last week is that he was likely going to need an MRI. He is a poorly controlled type II diabetic and has a subluxed metatarsal head which contributes to difficulty offloading 09/08/16; patient had what appeared to be a small draining  area over the right fifth metatarsal head. Her intake nurse correctly pointed out that this looked like a blister however with purulent drainage. He was not complaining of pain. He been recently traveling in [place] 09/15/16; the area over the right fifth metatarsal head is fully epithelialized. He had recently been traveling in [place] I think developed a friction injury over this area and subsequently a open wound. This was superficial and not as problematic as other wounds this man has had in the same area. 12/07/16 READMISSION Patient comes in today out of concern for again areas of skin thickening and question ulceration on the plantar aspect of his right fifth metatarsal head. We have been through this several times before and for one reason or another he has never managed to get offloading diabetic shoes. He arrives today and new balance shoes with no insole stating that he forgot but the insole in. In any case he states in the last week his wife noted that he had discoloration and thickened skin over this area and he became concerned and he is here for our review. He tells me works nights at the [employer] in [place]. He is not on his feet however. He has type 2 diabetes with diabetic neuropathy has a prior history of a subluxed metatarsal head #5 12/18/16; patient arrives today with again thick callus over his right fifth metatarsal head. Using a #5 curet I remove this. There is nothing open here. This is simply unrelieved pressure. He has never been able to get custom-made shoes. I referred him to podiatry to help with the latter and also to give [place] a suggestion whether they felt that he surgery on the fifth metatarsal head would be helpful in order to prevent wounds down the road 02/15/17 READMISSION Patient comes in today out of concern for a week's worth of deteriorating condition over the same right fifth plantar had that we've had difficulty with in the past. He is a type  II diabetic with diabetic neuropathy but does not have a history of PAD. I had previously suggested diabetic footwear with custom inserts on multiple occasions and last time he was here I asked that he see podiatry to look at subluxed right fifth metatarsal head to see if surgery in this area might help with recurrent ulcerations. He states he has not have an appointment because they have not called him back. 03/19/17; the patient comes in today with no open wound over the right fifth metatarsal head. He has been using a foam-based dressing that he is purchasing at a medical supply store. He has not received diabetic shoes but to be  fair to him there is apparently been difficulties getting a store to supplied easing [place]. I'll need to look into this issue. He had a paronychia I on his left hand saw urgent care and was given antibiotics yesterday ====== READMISSION 03/26/18 This patient is a now 49 year old diabetic man who is had recurrent problems with wounds on the right fifth metatarsal head. Last in this clinic I believe in 2018. We have always recommended that he get diabetic custom-made footwear however he has never managed to do so. Nevertheless he tells me that until 2 weeks ago this area was still intact when he noticed a open wound on the same side on the plantar aspect of the right fifth metatarsal head. There is no particular additional feature. He has diabetic peripheral neuropathy and is reasonably insensate. He saw his primary doctor and was given a course of amoxicillin which he just finished. I believe is being using an over-the-counter antibiotic ointment. ABI in this clinic was 1.0. He does not have a known history of PAD 04/02/18; culture I did last week showed abundant Escherichia coli had abundant Enterobacter. We use silver alginate last week not silver collagen as I said. I put him on ciprofloxacin 500 mg by mouth twice a day for 10 days starting today. Plain x-ray was  negative for osteomyelitis., 04/08/18; he continues on the antibiotics that I put him on. Plain x-ray was negative. He arrives today with his wound looking a little larger although that probably is a result of debridement I did last time. Surface of it looks viable although a little dry. We've been using silver alginate 04/15/18; total contact cast but on last week. He arrives today with a smaller opening wound. Using silver alginate 04/22/2018; total contact cast reapplied this week. Wound is smaller. Using silver alginate on the wound bed. 04/29/2018; the patient arrives with the area on the right plantar fifth metatarsal head fully epithelialized and healed. He has is foot wear with the inserts. I have advised him to keep this area padded as well. He has nothing but skin over bone and has had recurrent problems in this area. As usual he heals well with a total contact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

## 2019-12-17 ENCOUNTER — Telehealth: Payer: Self-pay | Admitting: Family Medicine

## 2019-12-17 NOTE — Telephone Encounter (Signed)
Caller : [facility] Call Back #  [phone]  Requesting a verbal order to omit a visit for today.  Per Marjean Donna, patient unable to see patient today, patient to bust, will resume next week for wound dressing.   Wife knows how to change wound dressing.

## 2019-12-17 NOTE — Telephone Encounter (Signed)
Verbal orders given  

## 2019-12-22 ENCOUNTER — Telehealth: Payer: Self-pay | Admitting: Family Medicine

## 2019-12-22 NOTE — Telephone Encounter (Signed)
Mitchell [name]  Call back # [phone]  Requesting an updated order for wound care.Verbal Order   Please advise

## 2019-12-22 NOTE — Telephone Encounter (Signed)
Order given

## 2019-12-31 ENCOUNTER — Encounter (HOSPITAL_BASED_OUTPATIENT_CLINIC_OR_DEPARTMENT_OTHER): Payer: No Typology Code available for payment source | Attending: Physician Assistant | Admitting: Physician Assistant

## 2019-12-31 DIAGNOSIS — I110 Hypertensive heart disease with heart failure: Secondary | ICD-10-CM | POA: Insufficient documentation

## 2019-12-31 DIAGNOSIS — E1151 Type 2 diabetes mellitus with diabetic peripheral angiopathy without gangrene: Secondary | ICD-10-CM | POA: Insufficient documentation

## 2019-12-31 DIAGNOSIS — H42 Glaucoma in diseases classified elsewhere: Secondary | ICD-10-CM | POA: Diagnosis not present

## 2019-12-31 DIAGNOSIS — L97514 Non-pressure chronic ulcer of other part of right foot with necrosis of bone: Secondary | ICD-10-CM | POA: Insufficient documentation

## 2019-12-31 DIAGNOSIS — E1139 Type 2 diabetes mellitus with other diabetic ophthalmic complication: Secondary | ICD-10-CM | POA: Diagnosis not present

## 2019-12-31 DIAGNOSIS — I509 Heart failure, unspecified: Secondary | ICD-10-CM | POA: Diagnosis not present

## 2019-12-31 DIAGNOSIS — L97521 Non-pressure chronic ulcer of other part of left foot limited to breakdown of skin: Secondary | ICD-10-CM | POA: Insufficient documentation

## 2019-12-31 DIAGNOSIS — E11621 Type 2 diabetes mellitus with foot ulcer: Secondary | ICD-10-CM | POA: Insufficient documentation
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
## 2020-01-13 ENCOUNTER — Other Ambulatory Visit: Payer: Self-pay | Admitting: Family Medicine

## 2020-01-13 NOTE — Telephone Encounter (Signed)
Medication: furosemide (LASIX) 40 MG tablet [     Has the patient contacted their pharmacy?  (If no, request that the patient contact the pharmacy for the refill.) (If yes, when and what did the pharmacy advise?)     Preferred Pharmacy (with phone number or street name): [facility] [address] [place] - [address]  [address] [place] [address]  Phone:  [phone] Fax:  [phone]     Agent: Please be advised that RX refills may take up to 3 business days. We ask that you follow-up with your pharmacy.

## 2020-01-14 MED ORDER — FUROSEMIDE 40 MG PO TABS: 40.0000 mg | ORAL_TABLET | Freq: Every day | ORAL | 0 refills | Status: DC

## 2020-01-14 NOTE — Telephone Encounter (Signed)
Does not look like you originally prescribed. Dr. Joya Martyr did-could you advise on if refill is appropriate?

## 2020-01-22 ENCOUNTER — Telehealth: Payer: Self-pay | Admitting: *Deleted

## 2020-01-22 NOTE — Telephone Encounter (Signed)
[facility] faxed a request for Entresto.  It was last filled by cardiologist Dr. Joya Martyr on 11/03/19.  Do you want to refill?

## 2020-01-26 ENCOUNTER — Other Ambulatory Visit (HOSPITAL_COMMUNITY): Payer: Self-pay

## 2020-01-26 MED ORDER — SACUBITRIL-VALSARTAN 24-26 MG PO TABS: 1.0000 | ORAL_TABLET | Freq: Two times a day (BID) | ORAL | 3 refills | Status: DC

## 2020-01-26 NOTE — Telephone Encounter (Signed)
Cardiologist filled for patient

## 2020-02-15 ENCOUNTER — Other Ambulatory Visit: Payer: Self-pay | Admitting: Endocrinology

## 2020-02-15 ENCOUNTER — Other Ambulatory Visit: Payer: Self-pay | Admitting: Family Medicine

## 2020-02-15 NOTE — Telephone Encounter (Signed)
1.  Please schedule f/u appt 2.  Then please refill x 2 mos, pending that appt.  
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
## 2020-02-17 ENCOUNTER — Other Ambulatory Visit: Payer: Self-pay | Admitting: Endocrinology

## 2020-02-17 MED ORDER — BASAGLAR KWIKPEN 100 UNIT/ML ~~LOC~~ SOPN: 130.0000 [IU] | PEN_INJECTOR | SUBCUTANEOUS | 1 refills | Status: DC

## 2020-02-17 NOTE — Telephone Encounter (Signed)
Patient called and is requesting Basaglar be refilled at [facility] on [address],.  Is out of insulin.

## 2020-02-18 ENCOUNTER — Telehealth: Payer: Self-pay | Admitting: Endocrinology

## 2020-02-18 ENCOUNTER — Ambulatory Visit: Payer: No Typology Code available for payment source | Admitting: Family Medicine

## 2020-02-19 NOTE — Telephone Encounter (Signed)
I sent rx.  F/u is due

## 2020-02-19 NOTE — Telephone Encounter (Signed)
Would you like pt to be seen before refills issued?

## 2020-02-20 NOTE — Telephone Encounter (Signed)
Already spoke with pt and he will contact office and schedule a f/u when he gets a break at work.

## 2020-02-20 NOTE — Telephone Encounter (Signed)
Called pt and informed him that per Dr. George Hugh request, he must schedule a f/u appt first and then refills will be issues. Pt verbalized understanding and stated that he would call back and schedule when he gets a break b[place]c [place].

## 2020-02-26 NOTE — Telephone Encounter (Signed)
Patient is scheduled for the next available - 03/30/20 at 2:45pm.

## 2020-03-03 ENCOUNTER — Other Ambulatory Visit: Payer: Self-pay | Admitting: Endocrinology
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
## 2020-03-15 ENCOUNTER — Ambulatory Visit: Payer: No Typology Code available for payment source | Admitting: Family Medicine

## 2020-03-23 NOTE — Patient Instructions (Addendum)
It was good to see you again today, I will be in touch your labs as soon as possible We will set you up to see Orthopedics for your shoulder Urology about your erectile dysfunction Gastroenterology about your colon cancer screening   Please get an eye exam annually   Please get your covid 19 series asap!  Flu shot this fall as well
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
## 2020-03-25 ENCOUNTER — Ambulatory Visit (INDEPENDENT_AMBULATORY_CARE_PROVIDER_SITE_OTHER): Payer: No Typology Code available for payment source | Admitting: Family Medicine

## 2020-03-25 ENCOUNTER — Encounter: Payer: Self-pay | Admitting: Family Medicine

## 2020-03-25 ENCOUNTER — Other Ambulatory Visit: Payer: Self-pay

## 2020-03-25 VITALS — BP 128/90 | HR 87 | Resp 18 | Ht 71.0 in | Wt 300.0 lb

## 2020-03-25 DIAGNOSIS — E782 Mixed hyperlipidemia: Secondary | ICD-10-CM

## 2020-03-25 DIAGNOSIS — Z1211 Encounter for screening for malignant neoplasm of colon: Secondary | ICD-10-CM

## 2020-03-25 DIAGNOSIS — I10 Essential (primary) hypertension: Secondary | ICD-10-CM | POA: Diagnosis not present

## 2020-03-25 DIAGNOSIS — I5022 Chronic systolic (congestive) heart failure: Secondary | ICD-10-CM | POA: Diagnosis not present

## 2020-03-25 DIAGNOSIS — T25322A Burn of third degree of left foot, initial encounter: Secondary | ICD-10-CM

## 2020-03-25 DIAGNOSIS — N529 Male erectile dysfunction, unspecified: Secondary | ICD-10-CM

## 2020-03-25 DIAGNOSIS — G8929 Other chronic pain: Secondary | ICD-10-CM

## 2020-03-25 DIAGNOSIS — M25512 Pain in left shoulder: Secondary | ICD-10-CM

## 2020-03-25 DIAGNOSIS — E119 Type 2 diabetes mellitus without complications: Secondary | ICD-10-CM

## 2020-03-25 DIAGNOSIS — Z794 Long term (current) use of insulin: Secondary | ICD-10-CM

## 2020-03-25 DIAGNOSIS — Z1159 Encounter for screening for other viral diseases: Secondary | ICD-10-CM

## 2020-03-25 MED ORDER — SACUBITRIL-VALSARTAN 24-26 MG PO TABS: 1.0000 | ORAL_TABLET | Freq: Two times a day (BID) | ORAL | 3 refills | Status: DC

## 2020-03-25 MED ORDER — FUROSEMIDE 40 MG PO TABS: 40.0000 mg | ORAL_TABLET | Freq: Every day | ORAL | 3 refills | Status: DC

## 2020-03-25 MED ORDER — CARVEDILOL 6.25 MG PO TABS: ORAL_TABLET | ORAL | 0 refills | Status: DC

## 2020-03-25 MED ORDER — ROSUVASTATIN CALCIUM 20 MG PO TABS: 20.0000 mg | ORAL_TABLET | Freq: Every day | ORAL | 3 refills | Status: DC

## 2020-03-30 ENCOUNTER — Ambulatory Visit (INDEPENDENT_AMBULATORY_CARE_PROVIDER_SITE_OTHER): Payer: No Typology Code available for payment source | Admitting: Endocrinology

## 2020-03-30 ENCOUNTER — Encounter: Payer: Self-pay | Admitting: Endocrinology

## 2020-03-30 ENCOUNTER — Other Ambulatory Visit: Payer: Self-pay

## 2020-03-30 VITALS — BP 122/82 | HR 101 | Ht 71.0 in | Wt 298.6 lb

## 2020-03-30 DIAGNOSIS — E118 Type 2 diabetes mellitus with unspecified complications: Secondary | ICD-10-CM

## 2020-03-30 LAB — POCT GLYCOSYLATED HEMOGLOBIN (HGB A1C): Hemoglobin A1C: 7.9 % — AB (ref 4.0–5.6)

## 2020-03-30 MED ORDER — BASAGLAR KWIKPEN 100 UNIT/ML ~~LOC~~ SOPN
80.0000 [IU] | PEN_INJECTOR | SUBCUTANEOUS | 3 refills | Status: DC
Start: 2020-03-30 — End: 2021-02-07

## 2020-03-30 NOTE — Patient Instructions (Addendum)
Please continue the same insulin for now.   check your blood sugar twice a day.  vary the time of day when you check, between before the 3 meals, and at bedtime.  also check if you have symptoms of your blood sugar being too high or too low.  please keep a record of the readings and bring it to your next appointment here (or you can bring the meter itself).  You can write it on any piece of paper.  please call us sooner if your blood sugar goes below 70, or if you have a lot of readings over 200. Please come back for a follow-up appointment in 2 months.

## 2020-03-30 NOTE — Progress Notes (Signed)
Subjective:    Patient ID: Mitchell [name], male    DOB: March 27, 1971, 49 y.o.   MRN: [id number]  HPI Pt returns for f/u of diabetes mellitus: DM type: Insulin-requiring type 2 Dx'ed: 5364 Complications: polyneuropathy, foot ulcer, renal insuff, and CVA.   Therapy: insulin since 2017, and jardiance.   DKA: never Severe hypoglycemia: never Pancreatitis: never Pancreatic imaging: normal on 2012 CT.  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  Interval history:  he says he has not recently missed the insulin. He has had 3 episodes of severe hypoglycemia last ov here. 2 weeks ago, he reduced the insulin to 80 units each morning.  He checks cbg less often than qd.  He says cbg's are in the 100's.   Past Medical History:  Diagnosis Date  . Bipolar disorder ([place])   . CAD (coronary artery disease)    a. diffuse 3v CAD by cath 2019, medical therapy recommended.  . Chronic systolic CHF (congestive heart failure) ([place])   . CVA (cerebral infarction)    No residual deficits  . Depression    PTSD,   . Diabetes mellitus    TYPE II; UNCONTROLLED BY HEMOGLOBIN A1c; STABLE AS  PER DISCHARGE  . Headache(784.0)   . 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 History of colonic polyps   . Hyperlipidemia   . Hypertension   . Myocardial infarction ([place]) 1987   (while playing football)  . Obesity   . OSA (obstructive sleep apnea)    repeat study 2018 without significant OSA  . Pneumonia   . Post-cardiac injury syndrome ([place])    History of cardiac injury from blunt trauma  . Pulmonary hypertension (HCC)    a. moderately elevated PASP 07/2019.  [name] Kitchen PVCs (premature ventricular contractions)   . Schizophrenia ([place])    Goes to [facility]  . Stroke [facility]) 2005   some left side weakness  . Syncope    Recurrent, thought to be vasovagal. Also has h/o frequent PVCs.     Past  Surgical History:  Procedure Laterality Date  . CARDIAC CATHETERIZATION  12/19/10   DIFFUSE NONOBSTRUCTIVE CAD; NONISCHEMIC CARDIOMYOPATHY; LEFT VENTRICULAR ANGIOGRAM WAS PERFORMED SECONDARY TO  ELEVATED LEFT VENTRICULAR FILLING PRESSURES  . COLONOSCOPY W/ POLYPECTOMY    . I & D EXTREMITY Bilateral 10/24/2019   Procedure: IRRIGATION AND DEBRIDEMENT BILATERAL EXTREMITY WOUND ON FOOT;  Surgeon: Cindra Presume, MD;  Location: [place];  Service: Plastics;  Laterality: Bilateral;  . METATARSAL HEAD EXCISION Right 08/29/2018   Procedure: METATARSAL HEAD RESECTION;  Surgeon: Edrick Kins, DPM;  Location: [place];  Service: Podiatry;  Laterality: Right;  . METATARSAL OSTEOTOMY Right 08/29/2018   Procedure: SUB FIFTHE METATARSIA RIGHT FOOT;  Surgeon: Edrick Kins, DPM;  Location: [place];  Service: Podiatry;  Laterality: Right;  . MULTIPLE EXTRACTIONS WITH ALVEOLOPLASTY  01/27/2014   "all my teeth; 4 Quadrants of alveoloplasty  . MULTIPLE EXTRACTIONS WITH ALVEOLOPLASTY N/A 01/27/2014   Procedure: EXTRACTION OF TEETH #'1, 2, 3, 4, 5, 6, 7, 8, 9, 10, 11, 12, 13, 14, 15, 16, 17, 20, 21, 22, 23, 24, 25, 26, 27, 28, 29, 31 and 32 WITH ALVEOLOPLASTY;  Surgeon: Lenn Cal, DDS;  Location: [place];  Service: Oral Surgery;  Laterality: N/A;  . ORIF FINGER / THUMB FRACTURE Right   . RIGHT/LEFT HEART CATH AND CORONARY ANGIOGRAPHY N/A  09/26/2017   Procedure: RIGHT/LEFT HEART CATH AND CORONARY ANGIOGRAPHY;  Surgeon: Jolaine Artist, MD;  Location: [facility];  Service: Cardiovascular;  Laterality: N/A;  . SKIN SPLIT GRAFT Bilateral 10/24/2019   Procedure: SKIN GRAFT SPLIT THICKNESS LEFT THIGH;  Surgeon: Cindra Presume, MD;  Location: [place];  Service: Plastics;  Laterality: Bilateral;  . WOUND DEBRIDEMENT Right 08/29/2018   Procedure: Debridement of ulcer on right fifth metatarsal;  Surgeon: Edrick Kins, DPM;  Location: [place];  Service: Podiatry;  Laterality: Right;    Social History   Socioeconomic  History  . Marital status: Married    Spouse name: Not on file  . Number of children: 5  . Years of education: Not on file  . Highest education level: Not on file  Occupational History  . Occupation: Mortician  Tobacco Use  . Smoking status: Never Smoker  . Smokeless tobacco: Never Used  Vaping Use  . Vaping Use: Never used  Substance and Sexual Activity  . Alcohol use: No  . Drug use: No  . Sexual activity: Not on file  Other Topics Concern  . Not on file  Social History Narrative   MARRIED, LIVES IN [place] WITH WIFE; 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   Social Determinants of Health   Financial Resource Strain:   . Difficulty of Paying Living Expenses: Not on file  Food Insecurity:   . Worried About Charity fundraiser in the Last Year: Not on file  . Ran Out of Food in the Last Year: Not on file  Transportation Needs:   . Lack of Transportation (Medical): Not on file  . Lack of Transportation (Non-Medical): Not on file  Physical Activity:   . Days of Exercise per Week: Not on file  . Minutes of Exercise per Session: Not on file  Stress:   . Feeling of Stress : Not on file  Social Connections:   . Frequency of Communication with Friends and Family: Not on file  . Frequency of Social Gatherings with Friends and Family: Not on file  . Attends Religious Services: Not on file  . Active Member of Clubs or Organizations: Not on file  . Attends Archivist Meetings: Not on file  . Marital Status: Not on file  Intimate Partner Violence:   . Fear of Current or Ex-Partner: Not on file  . Emotionally Abused: Not on file  . Physically Abused: Not on file  . Sexually Abused: Not on file    Current Outpatient Medications on File Prior to Visit  Medication Sig Dispense Refill  . acetaminophen (TYLENOL) 325 MG tablet Take 2 tablets (650 mg total) by mouth every 4 (four) hours as needed for  headache or mild pain.    [name] Kitchen aspirin EC 81 MG EC tablet Take 1 tablet (81 mg total) by mouth daily.    . Blood Glucose Monitoring Suppl (ACCU-CHEK GUIDE ME) w/Device KIT 1 each by Does not apply route 2 (two) times daily. Use to monitor glucose levels BID 1 kit 0  . carvedilol (COREG) 6.25 MG tablet Take 1 and 1/2 tab by mouth twice daily with meals 90 tablet 0  . furosemide (LASIX) 40 MG tablet Take 1 tablet (40 mg total) by mouth daily. 90 tablet 3  . glucose blood (ACCU-CHEK GUIDE) test strip 1 each by Other route in the morning and at  bedtime. And lancets 2/day. 200 each 3  . Lancets 28G MISC Use for glucose testing up to BID 100 each 12  . Lancets Misc. (ACCU-CHEK FASTCLIX LANCET) KIT Use to monitor glucose levels BID 200 kit 1  . rosuvastatin (CRESTOR) 20 MG tablet Take 1 tablet (20 mg total) by mouth daily. 90 tablet 3  . sacubitril-valsartan (ENTRESTO) 24-26 MG Take 1 tablet by mouth 2 (two) times daily. 180 tablet 3   No current facility-administered medications on file prior to visit.    Allergies  Allergen Reactions  . Nsaids Anaphylaxis and Other (See Comments)    Able to tolerate aspirin     Family History  Problem Relation Age of Onset  . Heart disease Mother        MI  . Heart failure Mother   . Diabetes Mother        ALSO IN MOST OF HIS SIBLINGS; 2 UNLCES HAVE ALSO PASSED AWAY FROM DM  . Cardiomyopathy Mother   . Cancer - Ovarian Mother   . Heart disease Father   . Hypertension Father   . Diabetes Father   . Diabetes Sister   . Diabetes Brother     BP 122/82   Pulse (!) 101   Ht 5' 11"  (1.803 m)   Wt 298 lb 9.6 oz (135.4 kg)   SpO2 97%   BMI 41.65 kg/m    Review of Systems He has gained a few lbs, since last ov.      Objective:   Physical Exam VITAL SIGNS:  See vs page.   GENERAL: no distress.    Pulses: dorsalis pedis intact bilat.   MSK: no deformity of the feet CV: 1+ bilat leg edema Skin:  no ulcer on the feet.  Patchy hypopigmentation of the  feet.  Old healed surgical scar on the right foot Neuro: sensation is intact to touch on the feet.    Lab Results  Component Value Date   HGBA1C 7.9 (A) 03/30/2020       Assessment & Plan:  Insulin-requiring type 2 DM, with CRI.  Noncompliance with cbg recording and f/u, ongoing.   Hypoglycemia, due to insulin: this limits aggressiveness of glycemic control.   Patient Instructions  Please continue the same insulin for now.   check your blood sugar twice a day.  vary the time of day when you check, between before the 3 meals, and at bedtime.  also check if you have symptoms of your blood sugar being too high or too low.  please keep a record of the readings and bring it to your next appointment here (or you can bring the meter itself).  You can write it on any piece of paper.  please call us sooner if your blood sugar goes below 70, or if you have a lot of readings over 200. Please come back for a follow-up appointment in 2 months.

## 2020-04-12 ENCOUNTER — Encounter: Payer: Self-pay | Admitting: Orthopaedic Surgery

## 2020-04-12 ENCOUNTER — Ambulatory Visit (INDEPENDENT_AMBULATORY_CARE_PROVIDER_SITE_OTHER): Payer: No Typology Code available for payment source | Admitting: Orthopaedic Surgery

## 2020-04-12 ENCOUNTER — Ambulatory Visit: Payer: Self-pay

## 2020-04-12 ENCOUNTER — Other Ambulatory Visit: Payer: Self-pay

## 2020-04-12 DIAGNOSIS — M25512 Pain in left shoulder: Secondary | ICD-10-CM | POA: Diagnosis not present

## 2020-04-12 DIAGNOSIS — M7542 Impingement syndrome of left shoulder: Secondary | ICD-10-CM | POA: Diagnosis not present

## 2020-04-12 MED ORDER — LIDOCAINE HCL 1 % IJ SOLN
3.0000 mL | INTRAMUSCULAR | Status: AC | PRN
  Administered 2020-04-12: 3 mL

## 2020-04-12 MED ORDER — METHYLPREDNISOLONE ACETATE 40 MG/ML IJ SUSP
40.0000 mg | INTRAMUSCULAR | Status: AC | PRN
  Administered 2020-04-12: 40 mg via INTRA_ARTICULAR

## 2020-04-12 MED ORDER — TRAMADOL HCL 50 MG PO TABS: 100.0000 mg | ORAL_TABLET | Freq: Four times a day (QID) | ORAL | 0 refills | Status: DC | PRN
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
## 2020-04-23 ENCOUNTER — Ambulatory Visit: Payer: No Typology Code available for payment source | Admitting: Rehabilitative and Restorative Service Providers"

## 2020-05-06 ENCOUNTER — Encounter: Payer: Self-pay | Admitting: Physician Assistant

## 2020-05-12 ENCOUNTER — Ambulatory Visit (INDEPENDENT_AMBULATORY_CARE_PROVIDER_SITE_OTHER): Payer: No Typology Code available for payment source | Admitting: Rehabilitative and Restorative Service Providers"

## 2020-05-12 ENCOUNTER — Encounter: Payer: Self-pay | Admitting: Rehabilitative and Restorative Service Providers"

## 2020-05-12 ENCOUNTER — Other Ambulatory Visit: Payer: Self-pay

## 2020-05-12 DIAGNOSIS — R600 Localized edema: Secondary | ICD-10-CM

## 2020-05-12 DIAGNOSIS — M25512 Pain in left shoulder: Secondary | ICD-10-CM | POA: Diagnosis not present

## 2020-05-12 DIAGNOSIS — G8929 Other chronic pain: Secondary | ICD-10-CM

## 2020-05-12 DIAGNOSIS — M6281 Muscle weakness (generalized): Secondary | ICD-10-CM

## 2020-05-12 DIAGNOSIS — M25612 Stiffness of left shoulder, not elsewhere classified: Secondary | ICD-10-CM

## 2020-05-12 NOTE — Patient Instructions (Signed)
Access Code: [email or web address] URL: [email or web address] Date: 05/12/2020 Prepared by: Pauletta Browns  Exercises Supine Scapular Protraction in Flexion with Dumbbells - 2-3 x daily - 7 x weekly - 1 sets - 20 reps - 3 seconds hold Standing Scapular Retraction - 5 x daily - 7 x weekly - 1 sets - 5 reps - 5 second hold Supine Shoulder Internal Rotation Stretch - 2-3 x daily - 7 x weekly - 1 sets - 10-20 reps - 10 secondsinutes hold Standing Shoulder Internal Rotation Stretch with Hands Behind Back - 5 x daily - 7 x weekly - 1 sets - 10 reps - 10 hold

## 2020-05-12 NOTE — Therapy (Signed)
[facility] Physical Therapy [address] [place], [place], [address] Phone: [phone]   Fax:  [phone]  Physical Therapy Evaluation  Patient Details  Name: Mitchell [name] MRN: [id number] Date of Birth: 1971/07/03 Referring Provider (PT): Doneen Poisson MD   Encounter Date: 05/12/2020   PT End of Session - 05/12/20 1747    Visit Number 1    Number of Visits 16    Date for PT Re-Evaluation 07/09/20    PT Start Time 1016    PT Stop Time 1100    PT Time Calculation (min) 44 min    Activity Tolerance Patient tolerated treatment well    Behavior During Therapy [facility] for tasks assessed/performed           Past Medical History:  Diagnosis Date  . Bipolar disorder (HCC)   . CAD (coronary artery disease)    a. diffuse 3v CAD by cath 2019, medical therapy recommended.  . Chronic systolic CHF (congestive heart failure) (HCC)   . CVA (cerebral infarction)    No residual deficits  . Depression    PTSD,   . Diabetes mellitus    TYPE II; UNCONTROLLED BY HEMOGLOBIN A1c; STABLE AS  PER DISCHARGE  . Headache(784.0)   . 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 History of colonic polyps   . Hyperlipidemia   . Hypertension   . Myocardial infarction (HCC) 1987   (while playing football)  . Obesity   . OSA (obstructive sleep apnea)    repeat study 2018 without significant OSA  . Pneumonia   . Post-cardiac injury syndrome (HCC)    History of cardiac injury from blunt trauma  . Pulmonary hypertension (HCC)    a. moderately elevated PASP 07/2019.  [name] Kitchen PVCs (premature ventricular contractions)   . Schizophrenia (HCC)    Goes to [facility]  . Stroke [facility]) 2005   some left side weakness  . Syncope    Recurrent, thought to be vasovagal. Also has h/o frequent PVCs.     Past Surgical History:  Procedure Laterality Date  . CARDIAC CATHETERIZATION  12/19/10   DIFFUSE NONOBSTRUCTIVE CAD; NONISCHEMIC CARDIOMYOPATHY; LEFT VENTRICULAR ANGIOGRAM WAS  PERFORMED SECONDARY TO  ELEVATED LEFT VENTRICULAR FILLING PRESSURES  . COLONOSCOPY W/ POLYPECTOMY    . I & D EXTREMITY Bilateral 10/24/2019   Procedure: IRRIGATION AND DEBRIDEMENT BILATERAL EXTREMITY WOUND ON FOOT;  Surgeon: Allena Napoleon, MD;  [facility];  Service: Plastics;  Laterality: Bilateral;  . METATARSAL HEAD EXCISION Right 08/29/2018   Procedure: METATARSAL HEAD RESECTION;  Surgeon: Felecia Shelling, DPM;  [facility];  Service: Podiatry;  Laterality: Right;  . METATARSAL OSTEOTOMY Right 08/29/2018   Procedure: SUB FIFTHE METATARSIA RIGHT FOOT;  Surgeon: Felecia Shelling, DPM;  [facility];  Service: Podiatry;  Laterality: Right;  . MULTIPLE EXTRACTIONS WITH ALVEOLOPLASTY  01/27/2014   "all my teeth; 4 Quadrants of alveoloplasty  . MULTIPLE EXTRACTIONS WITH ALVEOLOPLASTY N/A 01/27/2014   Procedure: EXTRACTION OF TEETH #'1, 2, 3, 4, 5, 6, 7, 8, 9, 10, 11, 12, 13, 14, 15, 16, 17, 20, 21, 22, 23, 24, 25, 26, 27, 28, 29, 31 and 32 WITH ALVEOLOPLASTY;  Surgeon: Charlynne Pander, DDS;  [facility]: [facility];  Service: Oral Surgery;  Laterality: N/A;  . ORIF FINGER / THUMB FRACTURE Right   . RIGHT/LEFT HEART CATH AND CORONARY ANGIOGRAPHY N/A 09/26/2017   Procedure: RIGHT/LEFT HEART CATH AND CORONARY ANGIOGRAPHY;  Surgeon: Dolores Patty, MD;  Location: [facility]  INVASIVE CV LAB;  Service: Cardiovascular;  Laterality: N/A;  . SKIN SPLIT GRAFT Bilateral 10/24/2019   Procedure: SKIN GRAFT SPLIT THICKNESS LEFT THIGH;  Surgeon: Allena Napoleon, MD;  [facility];  Service: Plastics;  Laterality: Bilateral;  . WOUND DEBRIDEMENT Right 08/29/2018   Procedure: Debridement of ulcer on right fifth metatarsal;  Surgeon: Felecia Shelling, DPM;  [facility];  Service: Podiatry;  Laterality: Right;    There were no vitals filed for this visit.    Subjective Assessment - 05/12/20 1743    Subjective Zayvion has had L shoulder pain for "at least a year and a half."    Pertinent History HTN, heart failure,  diabetes, obesity    Limitations Other (comment)   Sleeping and L shoulder use   Patient Stated Goals Sleep without L shoulder pain.  Use L shoulder normally.    Currently in Pain? Yes    Pain Score 7     Pain Location Shoulder    Pain Orientation Left    Pain Descriptors / Indicators Aching;Burning;Constant;Jabbing;Sharp;Tender;Tightness    Pain Type Chronic pain    Pain Radiating Towards L elbow    Pain Onset More than a month ago    Pain Frequency Constant    Aggravating Factors  Sleeping and L shoulder use    Pain Relieving Factors None    Effect of Pain on Daily Activities Limits sleep and L shoulder use    Multiple Pain Sites No              [facility] PT Assessment - 05/12/20 0001      Assessment   Medical Diagnosis L shoulder pain/impingement    Referring Provider (PT) Doneen Poisson MD    Onset Date/Surgical Date --   Chronic     Balance Screen   Has the patient fallen in the past 6 months No    Has the patient had a decrease in activity level because of a fear of falling?  No    Is the patient reluctant to leave their home because of a fear of falling?  No      Prior Function   Level of Independence Independent      Cognition   Overall Cognitive Status Within Functional Limits for tasks assessed      Observation/Other Assessments   Observations 32      ROM / Strength   AROM / PROM / Strength AROM;Strength      AROM   AROM Assessment Site Shoulder    Right/Left Shoulder Left;Right    Right Shoulder Flexion 150 Degrees    Right Shoulder Internal Rotation 60 Degrees    Right Shoulder External Rotation 95 Degrees    Right Shoulder Horizontal  ADduction 20 Degrees    Left Shoulder Flexion 100 Degrees    Left Shoulder Internal Rotation 30 Degrees    Left Shoulder External Rotation 75 Degrees    Left Shoulder Horizontal ADduction 10 Degrees      Strength   Overall Strength Deficits    Strength Assessment Site Shoulder    Right/Left Shoulder Left;Right     Right Shoulder Internal Rotation 5/5    Right Shoulder External Rotation 4+/5    Left Shoulder Internal Rotation 4-/5    Left Shoulder External Rotation 4-/5                      Objective measurements completed on examination: See above findings.  [facility] Adult PT Treatment/Exercise - 05/12/20 0001      Exercises   Exercises Shoulder      Shoulder Exercises: Supine   Protraction Strengthening;Left;20 reps   3 seconds   Internal Rotation AROM;Left;10 reps   10 seconds     Shoulder Exercises: Standing   Internal Rotation AROM;Left;10 reps   10 seconds thumb up the back   Row Strengthening;Both;10 reps   5 seconds                 PT Education - 05/12/20 1746    Education Details Shoulder anatomy, mechanism of impingement, HEP    Person(s) Educated Patient    Methods Explanation;Demonstration;Tactile cues;Verbal cues;Handout    Comprehension Verbalized understanding;Tactile cues required;Need further instruction;Returned demonstration;Verbal cues required               PT Long Term Goals - 05/12/20 1741      PT LONG TERM GOAL #1   Title Improve FOTO score to 59.    Baseline 32    Time 8    Period Weeks    Status New    Target Date 07/09/20      PT LONG TERM GOAL #2   Title Improve L shoulder AROM for flexion to 150; IR to 60; ER to 90 and HA to 20 degrees.    Baseline See objective.    Time 8    Period Weeks    Status New    Target Date 07/09/20      PT LONG TERM GOAL #3   Title Improve L shoulder strength for IR and ER to 5/5 MMT.    Baseline See objective.    Time 8    Period Weeks    Status New    Target Date 07/09/20      PT LONG TERM GOAL #4   Title Improve L shoulder pain to consistently 0-3/10 on the Numeric Pain Rating Scale.    Baseline 7/10    Time 8    Period Weeks    Status New    Target Date 07/09/20      PT LONG TERM GOAL #5   Title Dawood will be independent with his HEP at DC.    Time 8    Period Weeks     Status New    Target Date 07/09/20                  Plan - 05/12/20 1748    Clinical Impression Statement Stepfon has a stiff and weak L shoulder that has been a problem for > a year.  Impingement is certain with high probability of at least a partial RTC tear.  He will benefit from AROM, capsular flexibility, scapular and RTC strength work to meet LTGs.    Personal Factors and Comorbidities Comorbidity 1    Comorbidities Obesity, DM, HTN, heart failure    Examination-Activity Limitations Bathing;Dressing;Sleep;Carry;Reach Overhead;Toileting    Examination-Participation Restrictions Interpersonal Relationship;Community Activity;Driving    Stability/Clinical Decision Making Stable/Uncomplicated    Clinical Decision Making Low    Rehab Potential Good    PT Frequency 2x / week    PT Duration 8 weeks    PT Treatment/Interventions ADLs/Self Care Home Management;Cryotherapy;Therapeutic activities;Therapeutic exercise;Neuromuscular re-education;Patient/family education;Manual techniques;Passive range of motion;Vasopneumatic Device;Joint Manipulations    PT Next Visit Plan Progress AROM and capsular flexibility with scapular and RTC strengthening as appropriate.    PT Home Exercise Plan Access Code: [email or web address]    Consulted and Agree with  Plan of Care Patient           Patient will benefit from skilled therapeutic intervention in order to improve the following deficits and impairments:  Decreased activity tolerance, Decreased endurance, Decreased range of motion, Decreased strength, Hypomobility, Increased edema, Impaired UE functional use, Pain, Obesity  Visit Diagnosis: Chronic left shoulder pain  Muscle weakness (generalized)  Localized edema  Stiffness of left shoulder, not elsewhere classified     Problem List Patient Active Problem List   Diagnosis Date Noted  . Wound infection 10/21/2019  . Burn, foot, second degree, unspecified laterality, sequela 10/21/2019  . QT  prolongation 07/30/2019  . Acute exacerbation of CHF (congestive heart failure) (HCC) 07/30/2019  . Acute on chronic systolic congestive heart failure (HCC) 07/29/2019  . Foot ulcer (HCC) 03/11/2018  . Weakness 05/08/2017  . Leukocytosis 05/08/2017  . Stroke (HCC) 05/08/2017  . Slurred speech 05/08/2017  . Uncontrolled type 2 diabetes mellitus with hyperglycemia, with long-term current use of insulin (HCC) 12/07/2015  . Noncompliance 01/22/2014  . Chronic systolic heart failure (HCC) 10/02/2011  . Erectile dysfunction 10/02/2011  . Obstructive sleep apnea 10/11/2007  . Diabetes mellitus with complication (HCC) 10/10/2007  . HLD (hyperlipidemia) 10/10/2007  . HYPOKALEMIA 10/10/2007  . Obesity, Class III, BMI 40-49.9 (morbid obesity) (HCC) 10/10/2007  . Essential hypertension 10/10/2007  . PREMATURE VENTRICULAR CONTRACTIONS 10/10/2007  . SYNCOPE 10/10/2007  . COLONIC POLYPS, HX OF 10/10/2007    Cherlyn Cushing PT, MPT 05/12/2020, 5:57 PM  [facility] Physical Therapy [address] [place], [place], [address] Phone: [phone]   Fax:  [phone]  Name: OZ GAMMEL MRN: [id number] Date of Birth: Aug 11, 1970

## 2020-05-20 ENCOUNTER — Other Ambulatory Visit: Payer: Self-pay

## 2020-05-20 ENCOUNTER — Ambulatory Visit (INDEPENDENT_AMBULATORY_CARE_PROVIDER_SITE_OTHER): Payer: No Typology Code available for payment source | Admitting: Rehabilitative and Restorative Service Providers"

## 2020-05-20 ENCOUNTER — Encounter: Payer: Self-pay | Admitting: Rehabilitative and Restorative Service Providers"

## 2020-05-20 DIAGNOSIS — R600 Localized edema: Secondary | ICD-10-CM | POA: Diagnosis not present

## 2020-05-20 DIAGNOSIS — M6281 Muscle weakness (generalized): Secondary | ICD-10-CM | POA: Diagnosis not present

## 2020-05-20 DIAGNOSIS — M25512 Pain in left shoulder: Secondary | ICD-10-CM | POA: Diagnosis not present

## 2020-05-20 DIAGNOSIS — M25612 Stiffness of left shoulder, not elsewhere classified: Secondary | ICD-10-CM

## 2020-05-20 DIAGNOSIS — G8929 Other chronic pain: Secondary | ICD-10-CM

## 2020-05-20 NOTE — Therapy (Signed)
[facility] Physical Therapy [address] [place], [place], [address] Phone: [phone]   Fax:  [phone]  Physical Therapy Treatment  Patient Details  Name: Mitchell [name] MRN: [id number] Date of Birth: 04-07-1971 Referring Provider (PT): Doneen Poisson MD   Encounter Date: 05/20/2020   PT End of Session - 05/20/20 1333    Visit Number 2    Number of Visits 16    Date for PT Re-Evaluation 07/09/20    PT Start Time 1148    PT Stop Time 1226    PT Time Calculation (min) 38 min    Activity Tolerance Patient tolerated treatment well;No increased pain    Behavior During Therapy WFL for tasks assessed/performed           Past Medical History:  Diagnosis Date  . Bipolar disorder (HCC)   . CAD (coronary artery disease)    a. diffuse 3v CAD by cath 2019, medical therapy recommended.  . Chronic systolic CHF (congestive heart failure) (HCC)   . CVA (cerebral infarction)    No residual deficits  . Depression    PTSD,   . Diabetes mellitus    TYPE II; UNCONTROLLED BY HEMOGLOBIN A1c; STABLE AS  PER DISCHARGE  . Headache(784.0)   . 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 History of colonic polyps   . Hyperlipidemia   . Hypertension   . Myocardial infarction (HCC) 1987   (while playing football)  . Obesity   . OSA (obstructive sleep apnea)    repeat study 2018 without significant OSA  . Pneumonia   . Post-cardiac injury syndrome (HCC)    History of cardiac injury from blunt trauma  . Pulmonary hypertension (HCC)    a. moderately elevated PASP 07/2019.  [name] Kitchen PVCs (premature ventricular contractions)   . Schizophrenia (HCC)    Goes to [facility]  . Stroke [facility]) 2005   some left side weakness  . Syncope    Recurrent, thought to be vasovagal. Also has h/o frequent PVCs.     Past Surgical History:  Procedure Laterality Date  . CARDIAC CATHETERIZATION  12/19/10   DIFFUSE NONOBSTRUCTIVE CAD; NONISCHEMIC CARDIOMYOPATHY; LEFT  VENTRICULAR ANGIOGRAM WAS PERFORMED SECONDARY TO  ELEVATED LEFT VENTRICULAR FILLING PRESSURES  . COLONOSCOPY W/ POLYPECTOMY    . I & D EXTREMITY Bilateral 10/24/2019   Procedure: IRRIGATION AND DEBRIDEMENT BILATERAL EXTREMITY WOUND ON FOOT;  Surgeon: Allena Napoleon, MD;  [facility] OR;  Service: Plastics;  Laterality: Bilateral;  . METATARSAL HEAD EXCISION Right 08/29/2018   Procedure: METATARSAL HEAD RESECTION;  Surgeon: Felecia Shelling, DPM;  [facility] OR;  Service: Podiatry;  Laterality: Right;  . METATARSAL OSTEOTOMY Right 08/29/2018   Procedure: SUB FIFTHE METATARSIA RIGHT FOOT;  Surgeon: Felecia Shelling, DPM;  [facility];  Service: Podiatry;  Laterality: Right;  . MULTIPLE EXTRACTIONS WITH ALVEOLOPLASTY  01/27/2014   "all my teeth; 4 Quadrants of alveoloplasty  . MULTIPLE EXTRACTIONS WITH ALVEOLOPLASTY N/A 01/27/2014   Procedure: EXTRACTION OF TEETH #'1, 2, 3, 4, 5, 6, 7, 8, 9, 10, 11, 12, 13, 14, 15, 16, 17, 20, 21, 22, 23, 24, 25, 26, 27, 28, 29, 31 and 32 WITH ALVEOLOPLASTY;  Surgeon: Charlynne Pander, DDS;  [facility];  Service: Oral Surgery;  Laterality: N/A;  . ORIF FINGER / THUMB FRACTURE Right   . RIGHT/LEFT HEART CATH AND CORONARY ANGIOGRAPHY N/A 09/26/2017   Procedure: RIGHT/LEFT HEART CATH AND CORONARY ANGIOGRAPHY;  Surgeon: Dolores Patty, MD;  [facility] INV[facility];  Service: Cardiovascular;  Laterality: N/A;  . SKIN SPLIT GRAFT Bilateral 10/24/2019   Procedure: SKIN GRAFT SPLIT THICKNESS LEFT THIGH;  Surgeon: Allena Napoleon, MD;  [facility];  Service: Plastics;  Laterality: Bilateral;  . WOUND DEBRIDEMENT Right 08/29/2018   Procedure: Debridement of ulcer on right fifth metatarsal;  Surgeon: Felecia Shelling, DPM;  [facility];  Service: Podiatry;  Laterality: Right;    There were no vitals filed for this visit.   Subjective Assessment - 05/20/20 1329    Subjective Mitchell [name] reports better sleep and improved ability to "wipe himself" after 1 PT  visit.    Pertinent History HTN, heart failure, diabetes, obesity    Limitations Other (comment)   Sleeping and L shoulder use   Patient Stated Goals Sleep without L shoulder pain.  Use L shoulder normally.    Currently in Pain? Yes    Pain Score 2     Pain Location Shoulder    Pain Orientation Left    Pain Descriptors / Indicators Aching;Tightness    Pain Type Chronic pain    Pain Onset More than a month ago    Pain Frequency Intermittent    Aggravating Factors  IR    Pain Relieving Factors Exercises    Effect of Pain on Daily Activities Limited reaching behind the back, sleeping has improved    Multiple Pain Sites No                             [facility] Adult PT Treatment/Exercise - 05/20/20 0001      Exercises   Exercises Shoulder      Shoulder Exercises: Supine   Protraction Strengthening;Left;20 reps   3 seconds   Internal Rotation AROM;Left;10 reps   10 seconds & AAROM with PT 15 minutes     Shoulder Exercises: Standing   External Rotation Strengthening;Left;10 reps;Theraband   slow eccentrics 2 sets   Theraband Level (Shoulder External Rotation) Level 4 (Blue)    Internal Rotation AROM;Left;10 reps   10 seconds thumb up the back   Extension Strengthening;Left;10 reps;Theraband   slow eccentrics 2 sets   Theraband Level (Shoulder Extension) Level 2 (Red)    Row Strengthening;Both;10 reps   5 seconds                 PT Education - 05/20/20 1332    Education Details Reviewed and corrected HEP.  Corrections as appropriate.    Person(s) Educated Patient    Methods Explanation;Demonstration;Tactile cues;Verbal cues    Comprehension Verbalized understanding;Tactile cues required;Returned demonstration;Need further instruction;Verbal cues required               PT Long Term Goals - 05/20/20 1333      PT LONG TERM GOAL #1   Title Improve FOTO score to 59.    Baseline 32    Time 8    Period Weeks    Status On-going      PT LONG TERM GOAL  #2   Title Improve L shoulder AROM for flexion to 150; IR to 60; ER to 90 and HA to 20 degrees.    Baseline See objective.    Time 8    Period Weeks    Status On-going      PT LONG TERM GOAL #3   Title Improve L shoulder strength for IR and ER to 5/5 MMT.    Baseline See objective.  Time 8    Period Weeks    Status On-going      PT LONG TERM GOAL #4   Title Improve L shoulder pain to consistently 0-3/10 on the Numeric Pain Rating Scale.    Baseline 7/10    Time 8    Period Weeks    Status Achieved      PT LONG TERM GOAL #5   Title Mitchell [name] will be independent with his HEP at DC.    Time 8    Period Weeks    Status On-going                 Plan - 05/20/20 1335    Clinical Impression Statement Mitchell [name] reports he is sleeping better and he can "clean himself" better after using the bathroom.  IR looks better but is still stiff as compared to normal and the expected LTG.  Possible RA next week if current progress continues.    Personal Factors and Comorbidities Comorbidity 1    Comorbidities Obesity, DM, HTN, heart failure    Examination-Activity Limitations Bathing;Dressing;Sleep;Carry;Reach Overhead;Toileting    Examination-Participation Restrictions Interpersonal Relationship;Community Activity;Driving    Stability/Clinical Decision Making Stable/Uncomplicated    Rehab Potential Good    PT Frequency 2x / week    PT Duration 8 weeks    PT Treatment/Interventions ADLs/Self Care Home Management;Cryotherapy;Therapeutic activities;Therapeutic exercise;Neuromuscular re-education;Patient/family education;Manual techniques;Passive range of motion;Vasopneumatic Device;Joint Manipulations    PT Next Visit Plan Progress AROM and capsular flexibility with scapular and RTC strengthening as appropriate.    PT Home Exercise Plan Access Code: [email or web address]    Consulted and Agree with Plan of Care Patient           Patient will benefit from skilled therapeutic intervention in order to  improve the following deficits and impairments:  Decreased activity tolerance, Decreased endurance, Decreased range of motion, Decreased strength, Hypomobility, Increased edema, Impaired UE functional use, Pain, Obesity  Visit Diagnosis: Chronic left shoulder pain  Muscle weakness (generalized)  Localized edema  Stiffness of left shoulder, not elsewhere classified     Problem List Patient Active Problem List   Diagnosis Date Noted  . Wound infection 10/21/2019  . Burn, foot, second degree, unspecified laterality, sequela 10/21/2019  . QT prolongation 07/30/2019  . Acute exacerbation of CHF (congestive heart failure) (HCC) 07/30/2019  . Acute on chronic systolic congestive heart failure (HCC) 07/29/2019  . Foot ulcer (HCC) 03/11/2018  . Weakness 05/08/2017  . Leukocytosis 05/08/2017  . Stroke (HCC) 05/08/2017  . Slurred speech 05/08/2017  . Uncontrolled type 2 diabetes mellitus with hyperglycemia, with long-term current use of insulin (HCC) 12/07/2015  . Noncompliance 01/22/2014  . Chronic systolic heart failure (HCC) 10/02/2011  . Erectile dysfunction 10/02/2011  . Obstructive sleep apnea 10/11/2007  . Diabetes mellitus with complication (HCC) 10/10/2007  . HLD (hyperlipidemia) 10/10/2007  . HYPOKALEMIA 10/10/2007  . Obesity, Class III, BMI 40-49.9 (morbid obesity) (HCC) 10/10/2007  . Essential hypertension 10/10/2007  . PREMATURE VENTRICULAR CONTRACTIONS 10/10/2007  . SYNCOPE 10/10/2007  . COLONIC POLYPS, HX OF 10/10/2007    Cherlyn Cushing PT, MPT 05/20/2020, 1:37 PM  [facility] Physical Therapy [address] [place], [place], [address] Phone: [phone]   Fax:  [phone]  Name: Mitchell [name] MRN: [id number] Date of Birth: 19-Dec-1970

## 2020-05-24 ENCOUNTER — Ambulatory Visit (INDEPENDENT_AMBULATORY_CARE_PROVIDER_SITE_OTHER): Payer: No Typology Code available for payment source | Admitting: Orthopaedic Surgery

## 2020-05-24 ENCOUNTER — Encounter: Payer: Self-pay | Admitting: Orthopaedic Surgery

## 2020-05-24 DIAGNOSIS — M7542 Impingement syndrome of left shoulder: Secondary | ICD-10-CM | POA: Diagnosis not present

## 2020-05-24 NOTE — Progress Notes (Signed)
The patient is being seen in follow-up for his left shoulder impingement syndrome.  I injected his shoulder 6 weeks ago with a steroid in the subacromial space.  He is also been outpatient physical therapy.  He reports that he is doing very well.  He has made progress with forward flexion as well as abduction of left shoulder.  His internal rotation with adduction is also improved.  Is still to the lower lumbar spine but definitely improved from his last visit.  He is happy overall.  He will continue therapy for least the next several weeks to see if this will help continue to improve the function of his shoulder.  We can always reinject his shoulder in late December but only if needed.  All questions and concerns were answered and addressed.  Follow-up can be as needed.

## 2020-05-27 ENCOUNTER — Other Ambulatory Visit: Payer: Self-pay | Admitting: Family Medicine

## 2020-05-27 DIAGNOSIS — I10 Essential (primary) hypertension: Secondary | ICD-10-CM

## 2020-05-27 DIAGNOSIS — I5022 Chronic systolic (congestive) heart failure: Secondary | ICD-10-CM

## 2020-05-28 ENCOUNTER — Other Ambulatory Visit: Payer: Self-pay

## 2020-05-28 ENCOUNTER — Encounter: Payer: Self-pay | Admitting: Rehabilitative and Restorative Service Providers"

## 2020-05-28 ENCOUNTER — Ambulatory Visit (INDEPENDENT_AMBULATORY_CARE_PROVIDER_SITE_OTHER): Payer: No Typology Code available for payment source | Admitting: Rehabilitative and Restorative Service Providers"

## 2020-05-28 DIAGNOSIS — M25512 Pain in left shoulder: Secondary | ICD-10-CM | POA: Diagnosis not present

## 2020-05-28 DIAGNOSIS — M25612 Stiffness of left shoulder, not elsewhere classified: Secondary | ICD-10-CM

## 2020-05-28 DIAGNOSIS — G8929 Other chronic pain: Secondary | ICD-10-CM

## 2020-05-28 DIAGNOSIS — M6281 Muscle weakness (generalized): Secondary | ICD-10-CM
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
## 2020-06-02 ENCOUNTER — Encounter: Payer: Self-pay | Admitting: Physician Assistant

## 2020-06-02 ENCOUNTER — Ambulatory Visit (INDEPENDENT_AMBULATORY_CARE_PROVIDER_SITE_OTHER): Payer: No Typology Code available for payment source | Admitting: Physician Assistant

## 2020-06-02 ENCOUNTER — Encounter: Payer: No Typology Code available for payment source | Admitting: Rehabilitative and Restorative Service Providers"

## 2020-06-02 VITALS — BP 134/76 | HR 84 | Ht 71.5 in | Wt 298.0 lb

## 2020-06-02 DIAGNOSIS — I5022 Chronic systolic (congestive) heart failure: Secondary | ICD-10-CM | POA: Diagnosis not present

## 2020-06-02 DIAGNOSIS — Z1212 Encounter for screening for malignant neoplasm of rectum: Secondary | ICD-10-CM

## 2020-06-02 DIAGNOSIS — Z86010 Personal history of colonic polyps: Secondary | ICD-10-CM

## 2020-06-02 DIAGNOSIS — Z1211 Encounter for screening for malignant neoplasm of colon: Secondary | ICD-10-CM

## 2020-06-02 NOTE — Patient Instructions (Signed)
If you are age 51 or older, your body mass index should be between 23-30. Your Body mass index is 40.98 kg/m. If this is out of the aforementioned range listed, please consider follow up with your Primary Care Provider.  If you are age 91 or younger, your body mass index should be between 19-25. Your Body mass index is 40.98 kg/m. If this is out of the aformentioned range listed, please consider follow up with your Primary Care Provider.   Will call back the week after Thanksgiving to schedule colonoscopy for January.

## 2020-06-02 NOTE — Progress Notes (Signed)
Chief Complaint: Preprocedural visit for a screening colonoscopy in a patient with chronic heart failure  HPI:    [name] is a 49 year old African-American male with past medical history as listed below including CAD and CHF (07/30/2019 echo with EF 25-30%), who was referred to me by [name], [name], MD for a preprocedural visit for screening colonoscopy.      Today, the patient presents to clinic and explains that he had a colonoscopy 20 + years ago with " a lot of polyps".  Tells me that "they removed some and they tied some off".  Tells me he did well with that procedure and was not told that he needed follow-up.  We do not have records of this and is not sure exactly where it was done.  Denies any GI symptoms today.    Denies fever, chills, weight loss, change in bowels, abdominal pain, heartburn or reflux.  Past Medical History:  Diagnosis Date  . Bipolar disorder ([place])   . CAD (coronary artery disease)    a. diffuse 3v CAD by cath 2019, medical therapy recommended.  . Chronic systolic CHF (congestive heart failure) ([place])   . CVA (cerebral infarction)    No residual deficits  . Depression    PTSD,   . Diabetes mellitus    TYPE II; UNCONTROLLED BY HEMOGLOBIN A1c; STABLE AS  PER DISCHARGE  . Headache(784.0)   . 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 History of colonic polyps   . Hyperlipidemia   . Hypertension   . Myocardial infarction ([place]) 1987   (while playing football)  . Obesity   . OSA (obstructive sleep apnea)    repeat study 2018 without significant OSA  . Pneumonia   . Post-cardiac injury syndrome ([place])    History of cardiac injury from blunt trauma  . Pulmonary hypertension (H[facility])    a. moderately elevated PASP 07/2019.  [name] Kitchen PVCs (premature ventricular contractions)   . Schizophrenia ([place])    Goes to [facility]  . Stroke [facility]) 2005   some left side weakness  . Syncope    Recurrent, thought to be vasovagal. Also has h/o frequent PVCs.      Past Surgical History:  Procedure Laterality Date  . CARDIAC CATHETERIZATION  12/19/10   DIFFUSE NONOBSTRUCTIVE CAD; NONISCHEMIC CARDIOMYOPATHY; LEFT VENTRICULAR ANGIOGRAM WAS PERFORMED SECONDARY TO  ELEVATED LEFT VENTRICULAR FILLING PRESSURES  . COLONOSCOPY W/ POLYPECTOMY    . I & D EXTREMITY Bilateral 10/24/2019   Procedure: IRRIGATION AND DEBRIDEMENT BILATERAL EXTREMITY WOUND ON FOOT;  Surgeon: Cindra Presume, MD;  Location: [place];  Service: Plastics;  Laterality: Bilateral;  . METATARSAL HEAD EXCISION Right 08/29/2018   Procedure: METATARSAL HEAD RESECTION;  Surgeon: Edrick Kins, DPM;  Location: [place];  Service: Podiatry;  Laterality: Right;  . METATARSAL OSTEOTOMY Right 08/29/2018   Procedure: SUB FIFTHE METATARSIA RIGHT FOOT;  Surgeon: Edrick Kins, DPM;  Location: [place];  Service: Podiatry;  Laterality: Right;  . MULTIPLE EXTRACTIONS WITH ALVEOLOPLASTY  01/27/2014   "all my teeth; 4 Quadrants of alveoloplasty  . MULTIPLE EXTRACTIONS WITH ALVEOLOPLASTY N/A 01/27/2014   Procedure: EXTRACTION OF TEETH #'1, 2, 3, 4, 5, 6, 7, 8, 9, 10, 11, 12, 13, 14, 15, 16, 17, 20, 21, 22, 23, 24, 25, 26, 27, 28, 29, 31 and 32 WITH ALVEOLOPLASTY;  Surgeon: Lenn Cal, DDS;  Location: [place];  Service: Oral Surgery;  Laterality: N/A;  . ORIF FINGER / THUMB FRACTURE Right   .  RIGHT/LEFT HEART CATH AND CORONARY ANGIOGRAPHY N/A 09/26/2017   Procedure: RIGHT/LEFT HEART CATH AND CORONARY ANGIOGRAPHY;  Surgeon: Jolaine Artist, MD;  Location: [place] [facility];  Service: Cardiovascular;  Laterality: N/A;  . SKIN SPLIT GRAFT Bilateral 10/24/2019   Procedure: SKIN GRAFT SPLIT THICKNESS LEFT THIGH;  Surgeon: Cindra Presume, MD;  Location: [place];  Service: Plastics;  Laterality: Bilateral;  . WOUND DEBRIDEMENT Right 08/29/2018   Procedure: Debridement of ulcer on right fifth metatarsal;  Surgeon: Edrick Kins, DPM;  Location: [place];  Service: Podiatry;  Laterality: Right;    Current Outpatient  Medications  Medication Sig Dispense Refill  . acetaminophen (TYLENOL) 325 MG tablet Take 2 tablets (650 mg total) by mouth every 4 (four) hours as needed for headache or mild pain.    [name] Kitchen aspirin EC 81 MG EC tablet Take 1 tablet (81 mg total) by mouth daily.    . Blood Glucose Monitoring Suppl (ACCU-CHEK GUIDE ME) w/Device KIT 1 each by Does not apply route 2 (two) times daily. Use to monitor glucose levels BID 1 kit 0  . carvedilol (COREG) 6.25 MG tablet TAKE 1 & 1/2 (ONE & ONE-HALF) TABLETS BY MOUTH TWICE DAILY WITH A MEAL 90 tablet 3  . furosemide (LASIX) 40 MG tablet Take 1 tablet (40 mg total) by mouth daily. 90 tablet 3  . glucose blood (ACCU-CHEK GUIDE) test strip 1 each by Other route in the morning and at bedtime. And lancets 2/day. 200 each 3  . Insulin Glargine (BASAGLAR KWIKPEN) 100 UNIT/ML Inject 0.8 mLs (80 Units total) into the skin every morning. 90 mL 3  . Lancets 28G MISC Use for glucose testing up to BID 100 each 12  . Lancets Misc. (ACCU-CHEK FASTCLIX LANCET) KIT Use to monitor glucose levels BID 200 kit 1  . rosuvastatin (CRESTOR) 20 MG tablet Take 1 tablet (20 mg total) by mouth daily. 90 tablet 3  . sacubitril-valsartan (ENTRESTO) 24-26 MG Take 1 tablet by mouth 2 (two) times daily. 180 tablet 3  . traMADol (ULTRAM) 50 MG tablet Take 2 tablets (100 mg total) by mouth every 6 (six) hours as needed. 30 tablet 0   No current facility-administered medications for this visit.    Allergies as of 06/02/2020 - Review Complete 05/28/2020  Allergen Reaction Noted  . Nsaids Anaphylaxis and Other (See Comments)     Family History  Problem Relation Age of Onset  . Heart disease Mother        MI  . Heart failure Mother   . Diabetes Mother        ALSO IN MOST OF HIS SIBLINGS; 2 UNLCES HAVE ALSO PASSED AWAY FROM DM  . Cardiomyopathy Mother   . Cancer - Ovarian Mother   . Heart disease Father   . Hypertension Father   . Diabetes Father   . Diabetes Sister   . Diabetes Brother      Social History   Socioeconomic History  . Marital status: Married    Spouse name: Not on file  . Number of children: 5  . Years of education: Not on file  . Highest education level: Not on file  Occupational History  . Occupation: Mortician  Tobacco Use  . Smoking status: Never Smoker  . Smokeless tobacco: Never Used  Vaping Use  . Vaping Use: Never used  Substance and Sexual Activity  . Alcohol use: No  . Drug use: No  . Sexual activity: Not on file  Other Topics  Concern  . 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   Social Determinants of Health   Financial Resource Strain:   . Difficulty of Paying Living Expenses: Not on file  Food Insecurity:   . Worried About Charity fundraiser in the Last Year: Not on file  . Ran Out of Food in the Last Year: Not on file  Transportation Needs:   . Lack of Transportation (Medical): Not on file  . Lack of Transportation (Non-Medical): Not on file  Physical Activity:   . Days of Exercise per Week: Not on file  . Minutes of Exercise per Session: Not on file  Stress:   . Feeling of Stress : Not on file  Social Connections:   . Frequency of Communication with Friends and Family: Not on file  . Frequency of Social Gatherings with Friends and Family: Not on file  . Attends Religious Services: Not on file  . Active Member of Clubs or Organizations: Not on file  . Attends Archivist Meetings: Not on file  . Marital Status: Not on file  Intimate Partner Violence:   . Fear of Current or Ex-Partner: Not on file  . Emotionally Abused: Not on file  . Physically Abused: Not on file  . Sexually Abused: Not on file    Review of Systems:    Constitutional: No weight loss, fever or chills Skin: No rash  Cardiovascular: No chest pain Respiratory: No SOB  Gastrointestinal: See HPI  and otherwise negative Genitourinary: No dysuria  Neurological: No headache, dizziness or syncope Musculoskeletal: No new muscle or joint pain Hematologic: No bleeding Psychiatric: No history of depression or anxiety   Physical Exam:  Vital signs: BP 134/76   Pulse 84   Ht 5' 11.5" (1.816 m)   Wt 298 lb (135.2 kg)   BMI 40.98 kg/m   Constitutional:   Pleasant AA male appears to be in NAD, Well developed, Well nourished, alert and cooperative Head:  Normocephalic and atraumatic. Eyes:   PEERL, EOMI. No icterus. Conjunctiva pink. Ears:  Normal auditory acuity. Neck:  Supple Throat: Oral cavity and pharynx without inflammation, swelling or lesion.  Respiratory: Respirations even and unlabored. Lungs clear to auscultation bilaterally.   No wheezes, crackles, or rhonchi.  Cardiovascular: Normal S1, S2. No MRG. Regular rate and rhythm. No peripheral edema, cyanosis or pallor.  Gastrointestinal:  Soft, nondistended, nontender. No rebound or guarding. Normal bowel sounds. No appreciable masses or hepatomegaly. Rectal:  Not performed.  Msk:  Symmetrical without gross deformities. Without edema, no deformity or joint abnormality.  Neurologic:  Alert and  oriented x4;  grossly normal neurologically.  Skin:   Dry and intact without significant lesions or rashes. Psychiatric: Demonstrates good judgement and reason without abnormal affect or behaviors.  RELEVANT LABS AND IMAGING: CBC    Component Value Date/Time   WBC 10.7 (H) 11/03/2019 0459   RBC 4.22 11/03/2019 0459   HGB 12.4 (L) 11/03/2019 0459   HCT 38.8 (L) 11/03/2019 0459   PLT 370 11/03/2019 0459   MCV 91.9 11/03/2019 0459   MCH 29.4 11/03/2019 0459   MCHC 32.0 11/03/2019 0459   RDW 12.7 11/03/2019 0459   LYMPHSABS 1.2 10/27/2019 0459   MONOABS 0.8 10/27/2019 0459   EOSABS 0.1 10/27/2019 0459   BASOSABS 0.0 10/27/2019 0459  CMP     Component Value Date/Time   NA 140 11/03/2019 0459   K 4.2 11/03/2019 0459   CL 100  11/03/2019 0459   CO2 26 11/03/2019 0459   GLUCOSE 117 (H) 11/03/2019 0459   BUN 18 11/03/2019 0459   CREATININE 1.24 11/03/2019 0459   CALCIUM 9.2 11/03/2019 0459   PROT 7.1 07/29/2019 1708   ALBUMIN 2.3 (L) 10/25/2019 0602   AST 22 07/29/2019 1708   ALT 31 07/29/2019 1708   ALKPHOS 79 07/29/2019 1708   BILITOT 0.6 07/29/2019 1708   GFRNONAA >60 11/03/2019 0459   GFRAA >60 11/03/2019 0459    Assessment: 1.  Screening colonoscopy: Patient reports a colonoscopy 20 years ago with polyps 2.  History of heart failure: EF 25-30%  Plan: 1.  Scheduled patient for a colonoscopy at the hospital due to his heart failure.  This is scheduled with Dr. Henrene Pastor as he is supervising this morning.  Did discuss risks, benefits, limitations and alternatives and patient agrees to proceed. 2.  Patient to follow in clinic per recommendations after time of procedure.  Ellouise Newer, PA-C [facility] 06/02/2020, 10:35 AM  Cc: [name], MD

## 2020-06-02 NOTE — Progress Notes (Signed)
Assessment and plan noted for colonoscopy in this high risk patient

## 2020-06-03 ENCOUNTER — Ambulatory Visit: Payer: No Typology Code available for payment source | Admitting: Endocrinology

## 2020-06-04 ENCOUNTER — Encounter: Payer: No Typology Code available for payment source | Admitting: Rehabilitative and Restorative Service Providers"

## 2020-06-18 ENCOUNTER — Ambulatory Visit: Payer: No Typology Code available for payment source | Admitting: Endocrinology

## 2020-07-08 ENCOUNTER — Telehealth: Payer: Self-pay | Admitting: *Deleted

## 2020-07-08 ENCOUNTER — Other Ambulatory Visit: Payer: Self-pay | Admitting: *Deleted

## 2020-07-08 DIAGNOSIS — Z1212 Encounter for screening for malignant neoplasm of rectum: Secondary | ICD-10-CM

## 2020-07-08 DIAGNOSIS — I5022 Chronic systolic (congestive) heart failure: Secondary | ICD-10-CM

## 2020-07-08 DIAGNOSIS — Z1211 Encounter for screening for malignant neoplasm of colon: Secondary | ICD-10-CM

## 2020-07-08 NOTE — Telephone Encounter (Addendum)
Spoke with patient and he agreed to colonoscopy on Tuesday 08/31/20 at 8:30 am @ [facility] with Dr. Marina Goodell. Scheduled previsit appointment for Friday 08/20/20 @ 1 pm to go over instructions.

## 2020-07-08 NOTE — Telephone Encounter (Signed)
-----   Message from Mariane Duval, [place] sent at 06/02/2020 11:02 AM EDT ----- Call to schedule colonoscopy for hospital in Jan with Dr. Marina Goodell.

## 2020-07-13 ENCOUNTER — Telehealth: Payer: Self-pay | Admitting: Family Medicine

## 2020-07-13 NOTE — Telephone Encounter (Signed)
Received handicap form. Placed in providers folder for signature.

## 2020-07-13 NOTE — Telephone Encounter (Signed)
Patient came and dropped off paper to be signed.  Would like to be called when ready [phone]  Paper put into [name] folder

## 2020-07-14 ENCOUNTER — Telehealth: Payer: Self-pay | Admitting: Family Medicine

## 2020-07-14 NOTE — Telephone Encounter (Signed)
Pt states is needing to inform provider that pt his rx for furosemide (LASIX) 100 mg in dextrose 5 % 50 ml IVPB  is not working and wanted to verify if ok to change meds to Torsemide. Pt mentioned if possible to call him at  [phone] if any questions. Please advise.

## 2020-07-15 MED ORDER — TORSEMIDE 20 MG PO TABS: 20.0000 mg | ORAL_TABLET | Freq: Every day | ORAL | 2 refills | Status: DC

## 2020-07-15 NOTE — Telephone Encounter (Signed)
Call patient, he is currently taking Lasix 40 mg once a day for edema.  However, he notes that he is having more swelling.  He took a torsemide 20 recently belonging to his brother and felt it worked much better.  He would like to change his medication if possible  Most recent renal function in April normal-potassium also normal at that time   Most recent echo 12/20 1. Left ventricular ejection fraction, by visual estimation, is 25 to  30%. The left ventricle has severely decreased function. There is mildly  increased left ventricular hypertrophy   Using diuretics to manage heart failure associated edema  This change seems reasonable, will DC furosemide and start torsemide 20 mg  He is overdue for labs, scheduled a visit for January 5

## 2020-07-31 NOTE — Patient Instructions (Addendum)
Good to see you again today!  I will be in touch with your labs asap BP looks good You are seeing GI which is great I will set you up for a 2nd opinion with urology asap- will be in [facility]   Please get your 2nd covid vaccine asap!!  Once this is done ok to go to the gym for exercise if you wish
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
## 2020-08-03 ENCOUNTER — Other Ambulatory Visit: Payer: Self-pay

## 2020-08-04 ENCOUNTER — Ambulatory Visit (INDEPENDENT_AMBULATORY_CARE_PROVIDER_SITE_OTHER): Payer: No Typology Code available for payment source | Admitting: Family Medicine

## 2020-08-04 ENCOUNTER — Encounter: Payer: Self-pay | Admitting: Family Medicine

## 2020-08-04 VITALS — BP 140/82 | HR 93 | Resp 18 | Ht 71.5 in | Wt 303.0 lb

## 2020-08-04 DIAGNOSIS — E782 Mixed hyperlipidemia: Secondary | ICD-10-CM | POA: Diagnosis not present

## 2020-08-04 DIAGNOSIS — Z794 Long term (current) use of insulin: Secondary | ICD-10-CM | POA: Diagnosis not present

## 2020-08-04 DIAGNOSIS — N529 Male erectile dysfunction, unspecified: Secondary | ICD-10-CM

## 2020-08-04 DIAGNOSIS — I10 Essential (primary) hypertension: Secondary | ICD-10-CM

## 2020-08-04 DIAGNOSIS — E119 Type 2 diabetes mellitus without complications: Secondary | ICD-10-CM | POA: Diagnosis not present

## 2020-08-04 DIAGNOSIS — Z125 Encounter for screening for malignant neoplasm of prostate: Secondary | ICD-10-CM

## 2020-08-04 DIAGNOSIS — Z6841 Body Mass Index (BMI) 40.0 and over, adult: Secondary | ICD-10-CM

## 2020-08-04 LAB — COMPREHENSIVE METABOLIC PANEL
ALT: 11 U/L (ref 0–53)
AST: 17 U/L (ref 0–37)
Albumin: 4.1 g/dL (ref 3.5–5.2)
Alkaline Phosphatase: 72 U/L (ref 39–117)
BUN: 20 mg/dL (ref 6–23)
CO2: 30 mEq/L (ref 19–32)
Calcium: 9 mg/dL (ref 8.4–10.5)
Chloride: 107 mEq/L (ref 96–112)
Creatinine, Ser: 1.15 mg/dL (ref 0.40–1.50)
GFR: 73.44 mL/min (ref >60.00)
Glucose, Bld: 141 mg/dL — ABNORMAL HIGH (ref 70–99)
Potassium: 4.5 mEq/L (ref 3.5–5.1)
Sodium: 143 mEq/L (ref 135–145)
Total Bilirubin: 0.4 mg/dL (ref 0.2–1.2)
Total Protein: 6.8 g/dL (ref 6.0–8.3)

## 2020-08-04 LAB — CBC
HCT: 42.1 % (ref 39.0–52.0)
Hemoglobin: 13.6 g/dL (ref 13.0–17.0)
MCHC: 32.3 g/dL (ref 30.0–36.0)
MCV: 91.7 fl (ref 78.0–100.0)
Platelets: 145 10*3/uL — ABNORMAL LOW (ref 150.0–400.0)
RBC: 4.59 Mil/uL (ref 4.22–5.81)
RDW: 13.7 % (ref 11.5–15.5)
WBC: 9.7 10*3/uL (ref 4.0–10.5)

## 2020-08-04 LAB — LIPID PANEL
Cholesterol: 151 mg/dL (ref 0–200)
HDL: 34.3 mg/dL — ABNORMAL LOW (ref >39.00)
LDL Cholesterol: 86 mg/dL (ref 0–99)
NonHDL: 116.97
Total CHOL/HDL Ratio: 4
Triglycerides: 153 mg/dL — ABNORMAL HIGH (ref 0.0–149.0)
VLDL: 30.6 mg/dL (ref 0.0–40.0)

## 2020-08-04 LAB — PSA: PSA: 0.53 ng/mL (ref 0.10–4.00)

## 2020-08-04 LAB — HEMOGLOBIN A1C: Hgb A1c MFr Bld: 8.1 % — ABNORMAL HIGH (ref 4.6–6.5)

## 2020-08-20 ENCOUNTER — Other Ambulatory Visit: Payer: Self-pay

## 2020-08-20 ENCOUNTER — Ambulatory Visit (AMBULATORY_SURGERY_CENTER): Payer: No Typology Code available for payment source | Admitting: *Deleted

## 2020-08-20 VITALS — Ht 71.5 in | Wt 303.0 lb

## 2020-08-20 DIAGNOSIS — Z01818 Encounter for other preprocedural examination: Secondary | ICD-10-CM

## 2020-08-20 DIAGNOSIS — Z1211 Encounter for screening for malignant neoplasm of colon: Secondary | ICD-10-CM

## 2020-08-20 MED ORDER — PLENVU 140 G PO SOLR: 1.0000 | ORAL | 0 refills | Status: DC

## 2020-08-20 NOTE — Progress Notes (Signed)
Pt verified name, DOB, address and insurance during PV today. Pt mailed instruction packet to included paper to complete and mail back to [facility] with addressed and stamped envelope, Emmi video, copy of consent form to read and not return, and instructions. Plenvu  coupon mailed in packet. PV completed over the phone. Pt encouraged to call with questions or issues   No egg or soy allergy known to patient  No issues with past sedation with any surgeries or procedures No intubation problems in the past  No FH of Malignant Hyperthermia No diet pills per patient No home 02 use per patient  No blood thinners per patient  Pt denies issues with constipation  No A fib or A flutter  EMMI video to pt or via MyChart  COVID 19 guidelines implemented in PV today with Pt and RN  Pt is fully vaccinated  for Covid1-28 COV test for [facility] case 1 pm    Plenvu  Coupon given to pt in PV today , Code to Pharmacy = PA explained to pt at [facility]   Due to the COVID-19 pandemic we are asking patients to follow certain guidelines.  Pt aware of COVID protocols and [facility] guidelines

## 2020-08-26 ENCOUNTER — Telehealth: Payer: Self-pay | Admitting: Internal Medicine

## 2020-08-26 NOTE — Telephone Encounter (Signed)
Pt is requesting a call back to discuss a cheaper alternative for his prep since he is being charged $50.

## 2020-08-26 NOTE — Telephone Encounter (Signed)
Pt states he cannot pay $50- he states he doesn't have the money   Will give pt a sample- of Plenvu- Lot [id number]  Exp 10/2020   Pt to pick up from 3rd Floor

## 2020-08-27 ENCOUNTER — Other Ambulatory Visit (HOSPITAL_COMMUNITY)
Admission: RE | Admit: 2020-08-27 | Discharge: 2020-08-27 | Disposition: A | Discharge status: Home / Self Care | Payer: No Typology Code available for payment source | Source: Ambulatory Visit | Attending: Internal Medicine | Admitting: Internal Medicine

## 2020-08-27 DIAGNOSIS — Z01812 Encounter for preprocedural laboratory examination: Secondary | ICD-10-CM | POA: Diagnosis present

## 2020-08-27 DIAGNOSIS — Z20822 Contact with and (suspected) exposure to covid-19: Secondary | ICD-10-CM | POA: Insufficient documentation

## 2020-08-27 LAB — SARS CORONAVIRUS 2 (TAT 6-24 HRS): SARS Coronavirus 2: POSITIVE — AB

## 2020-08-28 NOTE — Progress Notes (Signed)
Patient with positive covid result. Contacted MD and informed of result.   

## 2020-08-31 ENCOUNTER — Encounter (HOSPITAL_COMMUNITY): Admission: RE | Payer: Self-pay | Source: Home / Self Care

## 2020-08-31 ENCOUNTER — Ambulatory Visit (HOSPITAL_COMMUNITY)
Admission: RE | Admit: 2020-08-31 | Payer: No Typology Code available for payment source | Source: Home / Self Care | Admitting: Internal Medicine

## 2020-08-31 ENCOUNTER — Telehealth: Payer: Self-pay | Admitting: Family Medicine

## 2020-08-31 SURGERY — COLONOSCOPY WITH PROPOFOL
Anesthesia: Monitor Anesthesia Care
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
## 2020-08-31 NOTE — Telephone Encounter (Signed)
Fyi only

## 2020-08-31 NOTE — Telephone Encounter (Signed)
Caller : Mitchell [name] Call Back @ [phone]  Patient states he tested positive for COVID 19+ prior to his colonoscopy scheduled on 08/27/2020. Patient asked to r/s colonoscopy and quarantine for five day.

## 2020-09-01 ENCOUNTER — Telehealth (INDEPENDENT_AMBULATORY_CARE_PROVIDER_SITE_OTHER): Payer: No Typology Code available for payment source | Admitting: Family Medicine

## 2020-09-01 ENCOUNTER — Other Ambulatory Visit: Payer: Self-pay

## 2020-09-01 DIAGNOSIS — U071 COVID-19: Secondary | ICD-10-CM | POA: Diagnosis not present

## 2020-10-29 ENCOUNTER — Ambulatory Visit (AMBULATORY_SURGERY_CENTER): Payer: Self-pay

## 2020-10-29 ENCOUNTER — Encounter: Payer: Self-pay | Admitting: Internal Medicine

## 2020-10-29 VITALS — Ht 71.5 in | Wt 290.0 lb

## 2020-10-29 DIAGNOSIS — Z1211 Encounter for screening for malignant neoplasm of colon: Secondary | ICD-10-CM

## 2020-10-29 MED ORDER — PEG 3350-KCL-NA BICARB-NACL 420 G PO SOLR: 4000.0000 mL | Freq: Once | ORAL | 0 refills | Status: AC

## 2020-10-29 NOTE — Progress Notes (Signed)
No egg or soy allergy known to patient  No issues with past sedation with any surgeries or procedures Patient denies ever being told they had issues or difficulty with intubation  No FH of Malignant Hyperthermia No diet pills per patient No home 02 use per patient  No blood thinners per patient  Pt denies issues with constipation  No A fib or A flutter  EMMI video to pt or via MyChart  COVID 19 guidelines implemented in PV today with Pt and RN  Pt is fully vaccinated  for Covid   Colon at [facility] on 11/09/20  Pt had covid 08/27/20  Pt states he lost the prep sample of plenvu he was given, let him know we have no samples now so I ordered golytely per his insurance coverage and if he finds the plenvu I also printed his plenvu instructions and told him to call so we could go over these.   Due to the COVID-19 pandemic we are asking patients to follow certain guidelines.  Pt aware of COVID protocols and [facility] guidelines

## 2020-11-03 ENCOUNTER — Encounter (HOSPITAL_COMMUNITY): Payer: Self-pay | Admitting: Internal Medicine

## 2020-11-09 ENCOUNTER — Ambulatory Visit (HOSPITAL_COMMUNITY)
Admission: RE | Admit: 2020-11-09 | Payer: No Typology Code available for payment source | Source: Home / Self Care | Admitting: Internal Medicine

## 2020-11-09 ENCOUNTER — Encounter (HOSPITAL_COMMUNITY): Payer: Self-pay | Admitting: Certified Registered Nurse Anesthetist

## 2020-11-09 SURGERY — COLONOSCOPY WITH PROPOFOL
Anesthesia: Monitor Anesthesia Care

## 2020-11-09 MED ORDER — PROPOFOL 500 MG/50ML IV EMUL
INTRAVENOUS | Status: AC
  Filled 2020-11-09: qty 50

## 2020-11-09 MED ORDER — PROPOFOL 1000 MG/100ML IV EMUL
INTRAVENOUS | Status: AC
  Filled 2020-11-09: qty 100

## 2020-12-21 ENCOUNTER — Telehealth: Payer: Self-pay | Admitting: Family Medicine

## 2020-12-21 NOTE — Telephone Encounter (Signed)
Placed in providers folder

## 2020-12-21 NOTE — Telephone Encounter (Signed)
Patient brought in [facility]  form to be filled out by [name]  Placed into [name] folder

## 2020-12-22 ENCOUNTER — Other Ambulatory Visit: Payer: Self-pay | Admitting: Family Medicine

## 2020-12-22 NOTE — Telephone Encounter (Signed)
Placed copy on file. Original placed up front for patient to pick up.Called patient-he is aware.

## 2020-12-23 NOTE — Telephone Encounter (Signed)
Please advise if refill appropriate.  

## 2020-12-24 ENCOUNTER — Telehealth: Payer: Self-pay

## 2020-12-24 NOTE — Telephone Encounter (Signed)
Started to complete PA via cover my meds for patients torsemide however the PA was archived by pharmacy-rx was able to be covered no pa needed.

## 2021-01-23 ENCOUNTER — Emergency Department (HOSPITAL_COMMUNITY): Payer: No Typology Code available for payment source

## 2021-01-23 ENCOUNTER — Inpatient Hospital Stay (HOSPITAL_COMMUNITY)
Admission: EM | Admit: 2021-01-23 | Discharge: 2021-02-07 | DRG: 853 | Disposition: A | Discharge status: Rehabilitation Facility | Payer: No Typology Code available for payment source | Attending: Internal Medicine | Admitting: Internal Medicine

## 2021-01-23 DIAGNOSIS — Z888 Allergy status to other drugs, medicaments and biological substances status: Secondary | ICD-10-CM

## 2021-01-23 DIAGNOSIS — N1832 Chronic kidney disease, stage 3b: Secondary | ICD-10-CM | POA: Diagnosis present

## 2021-01-23 DIAGNOSIS — L089 Local infection of the skin and subcutaneous tissue, unspecified: Secondary | ICD-10-CM | POA: Diagnosis not present

## 2021-01-23 DIAGNOSIS — Z89511 Acquired absence of right leg below knee: Secondary | ICD-10-CM | POA: Diagnosis not present

## 2021-01-23 DIAGNOSIS — Z79899 Other long term (current) drug therapy: Secondary | ICD-10-CM

## 2021-01-23 DIAGNOSIS — E1152 Type 2 diabetes mellitus with diabetic peripheral angiopathy with gangrene: Secondary | ICD-10-CM | POA: Diagnosis not present

## 2021-01-23 DIAGNOSIS — R55 Syncope and collapse: Secondary | ICD-10-CM | POA: Diagnosis not present

## 2021-01-23 DIAGNOSIS — L02415 Cutaneous abscess of right lower limb: Secondary | ICD-10-CM | POA: Diagnosis present

## 2021-01-23 DIAGNOSIS — R7989 Other specified abnormal findings of blood chemistry: Secondary | ICD-10-CM | POA: Diagnosis present

## 2021-01-23 DIAGNOSIS — T148XXA Other injury of unspecified body region, initial encounter: Secondary | ICD-10-CM

## 2021-01-23 DIAGNOSIS — R6520 Severe sepsis without septic shock: Secondary | ICD-10-CM | POA: Diagnosis present

## 2021-01-23 DIAGNOSIS — E876 Hypokalemia: Secondary | ICD-10-CM | POA: Diagnosis not present

## 2021-01-23 DIAGNOSIS — E1169 Type 2 diabetes mellitus with other specified complication: Secondary | ICD-10-CM | POA: Diagnosis present

## 2021-01-23 DIAGNOSIS — B9562 Methicillin resistant Staphylococcus aureus infection as the cause of diseases classified elsewhere: Secondary | ICD-10-CM | POA: Diagnosis present

## 2021-01-23 DIAGNOSIS — D638 Anemia in other chronic diseases classified elsewhere: Secondary | ICD-10-CM | POA: Diagnosis present

## 2021-01-23 DIAGNOSIS — I96 Gangrene, not elsewhere classified: Secondary | ICD-10-CM | POA: Diagnosis not present

## 2021-01-23 DIAGNOSIS — E11621 Type 2 diabetes mellitus with foot ulcer: Secondary | ICD-10-CM | POA: Diagnosis present

## 2021-01-23 DIAGNOSIS — E875 Hyperkalemia: Secondary | ICD-10-CM | POA: Diagnosis present

## 2021-01-23 DIAGNOSIS — N179 Acute kidney failure, unspecified: Secondary | ICD-10-CM | POA: Diagnosis not present

## 2021-01-23 DIAGNOSIS — A419 Sepsis, unspecified organism: Secondary | ICD-10-CM | POA: Diagnosis not present

## 2021-01-23 DIAGNOSIS — N170 Acute kidney failure with tubular necrosis: Secondary | ICD-10-CM | POA: Diagnosis present

## 2021-01-23 DIAGNOSIS — I5022 Chronic systolic (congestive) heart failure: Secondary | ICD-10-CM | POA: Diagnosis present

## 2021-01-23 DIAGNOSIS — Z9119 Patient's noncompliance with other medical treatment and regimen: Secondary | ICD-10-CM

## 2021-01-23 DIAGNOSIS — I472 Ventricular tachycardia: Secondary | ICD-10-CM | POA: Diagnosis not present

## 2021-01-23 DIAGNOSIS — F319 Bipolar disorder, unspecified: Secondary | ICD-10-CM | POA: Diagnosis present

## 2021-01-23 DIAGNOSIS — A4189 Other specified sepsis: Secondary | ICD-10-CM | POA: Diagnosis not present

## 2021-01-23 DIAGNOSIS — R9431 Abnormal electrocardiogram [ECG] [EKG]: Secondary | ICD-10-CM | POA: Diagnosis present

## 2021-01-23 DIAGNOSIS — I214 Non-ST elevation (NSTEMI) myocardial infarction: Secondary | ICD-10-CM | POA: Diagnosis not present

## 2021-01-23 DIAGNOSIS — E1165 Type 2 diabetes mellitus with hyperglycemia: Secondary | ICD-10-CM

## 2021-01-23 DIAGNOSIS — Z95810 Presence of automatic (implantable) cardiac defibrillator: Secondary | ICD-10-CM

## 2021-01-23 DIAGNOSIS — F209 Schizophrenia, unspecified: Secondary | ICD-10-CM | POA: Clinically undetermined

## 2021-01-23 DIAGNOSIS — E1122 Type 2 diabetes mellitus with diabetic chronic kidney disease: Secondary | ICD-10-CM | POA: Diagnosis present

## 2021-01-23 DIAGNOSIS — F4310 Post-traumatic stress disorder, unspecified: Secondary | ICD-10-CM | POA: Diagnosis present

## 2021-01-23 DIAGNOSIS — I255 Ischemic cardiomyopathy: Secondary | ICD-10-CM | POA: Diagnosis present

## 2021-01-23 DIAGNOSIS — I10 Essential (primary) hypertension: Secondary | ICD-10-CM | POA: Diagnosis not present

## 2021-01-23 DIAGNOSIS — F40240 Claustrophobia: Secondary | ICD-10-CM | POA: Diagnosis present

## 2021-01-23 DIAGNOSIS — I130 Hypertensive heart and chronic kidney disease with heart failure and stage 1 through stage 4 chronic kidney disease, or unspecified chronic kidney disease: Secondary | ICD-10-CM | POA: Diagnosis present

## 2021-01-23 DIAGNOSIS — Z8719 Personal history of other diseases of the digestive system: Secondary | ICD-10-CM

## 2021-01-23 DIAGNOSIS — Z7982 Long term (current) use of aspirin: Secondary | ICD-10-CM

## 2021-01-23 DIAGNOSIS — R29810 Facial weakness: Secondary | ICD-10-CM | POA: Diagnosis not present

## 2021-01-23 DIAGNOSIS — G9341 Metabolic encephalopathy: Secondary | ICD-10-CM | POA: Diagnosis not present

## 2021-01-23 DIAGNOSIS — I252 Old myocardial infarction: Secondary | ICD-10-CM

## 2021-01-23 DIAGNOSIS — E669 Obesity, unspecified: Secondary | ICD-10-CM | POA: Diagnosis present

## 2021-01-23 DIAGNOSIS — L0390 Cellulitis, unspecified: Secondary | ICD-10-CM | POA: Diagnosis present

## 2021-01-23 DIAGNOSIS — G4733 Obstructive sleep apnea (adult) (pediatric): Secondary | ICD-10-CM | POA: Diagnosis not present

## 2021-01-23 DIAGNOSIS — Z8673 Personal history of transient ischemic attack (TIA), and cerebral infarction without residual deficits: Secondary | ICD-10-CM

## 2021-01-23 DIAGNOSIS — U071 COVID-19: Secondary | ICD-10-CM | POA: Diagnosis present

## 2021-01-23 DIAGNOSIS — I739 Peripheral vascular disease, unspecified: Secondary | ICD-10-CM | POA: Diagnosis not present

## 2021-01-23 DIAGNOSIS — M7989 Other specified soft tissue disorders: Secondary | ICD-10-CM

## 2021-01-23 DIAGNOSIS — Z6841 Body Mass Index (BMI) 40.0 and over, adult: Secondary | ICD-10-CM

## 2021-01-23 DIAGNOSIS — R0602 Shortness of breath: Secondary | ICD-10-CM | POA: Diagnosis present

## 2021-01-23 DIAGNOSIS — L03115 Cellulitis of right lower limb: Secondary | ICD-10-CM | POA: Diagnosis present

## 2021-01-23 DIAGNOSIS — I471 Supraventricular tachycardia: Secondary | ICD-10-CM | POA: Diagnosis not present

## 2021-01-23 DIAGNOSIS — I25110 Atherosclerotic heart disease of native coronary artery with unstable angina pectoris: Secondary | ICD-10-CM | POA: Diagnosis present

## 2021-01-23 DIAGNOSIS — Z8249 Family history of ischemic heart disease and other diseases of the circulatory system: Secondary | ICD-10-CM

## 2021-01-23 DIAGNOSIS — E11628 Type 2 diabetes mellitus with other skin complications: Secondary | ICD-10-CM

## 2021-01-23 DIAGNOSIS — M86171 Other acute osteomyelitis, right ankle and foot: Secondary | ICD-10-CM | POA: Diagnosis present

## 2021-01-23 DIAGNOSIS — I5043 Acute on chronic combined systolic (congestive) and diastolic (congestive) heart failure: Secondary | ICD-10-CM | POA: Diagnosis not present

## 2021-01-23 DIAGNOSIS — S88119A Complete traumatic amputation at level between knee and ankle, unspecified lower leg, initial encounter: Secondary | ICD-10-CM | POA: Diagnosis not present

## 2021-01-23 DIAGNOSIS — L02611 Cutaneous abscess of right foot: Secondary | ICD-10-CM | POA: Diagnosis not present

## 2021-01-23 DIAGNOSIS — G928 Other toxic encephalopathy: Secondary | ICD-10-CM | POA: Diagnosis not present

## 2021-01-23 DIAGNOSIS — R079 Chest pain, unspecified: Secondary | ICD-10-CM | POA: Diagnosis not present

## 2021-01-23 DIAGNOSIS — E785 Hyperlipidemia, unspecified: Secondary | ICD-10-CM | POA: Diagnosis not present

## 2021-01-23 DIAGNOSIS — E119 Type 2 diabetes mellitus without complications: Secondary | ICD-10-CM

## 2021-01-23 DIAGNOSIS — R778 Other specified abnormalities of plasma proteins: Secondary | ICD-10-CM | POA: Diagnosis not present

## 2021-01-23 DIAGNOSIS — Z794 Long term (current) use of insulin: Secondary | ICD-10-CM

## 2021-01-23 DIAGNOSIS — Z833 Family history of diabetes mellitus: Secondary | ICD-10-CM

## 2021-01-23 DIAGNOSIS — G8918 Other acute postprocedural pain: Secondary | ICD-10-CM | POA: Diagnosis not present

## 2021-01-23 LAB — I-STAT CHEM 8, ED
BUN: 31 mg/dL — ABNORMAL HIGH (ref 6–20)
Calcium, Ion: 0.97 mmol/L — ABNORMAL LOW (ref 1.15–1.40)
Chloride: 98 mmol/L (ref 98–111)
Creatinine, Ser: 2.3 mg/dL — ABNORMAL HIGH (ref 0.61–1.24)
Glucose, Bld: 366 mg/dL — ABNORMAL HIGH (ref 70–99)
HCT: 40 % (ref 39.0–52.0)
Hemoglobin: 13.6 g/dL (ref 13.0–17.0)
Potassium: 3.8 mmol/L (ref 3.5–5.1)
Sodium: 136 mmol/L (ref 135–145)
TCO2: 27 mmol/L (ref 22–32)

## 2021-01-23 LAB — PROTIME-INR
INR: 1.2 (ref 0.8–1.2)
Prothrombin Time: 15.5 seconds — ABNORMAL HIGH (ref 11.4–15.2)

## 2021-01-23 LAB — I-STAT VENOUS BLOOD GAS, ED
Acid-Base Excess: 3 mmol/L — ABNORMAL HIGH (ref 0.0–2.0)
Bicarbonate: 28.6 mmol/L — ABNORMAL HIGH (ref 20.0–28.0)
Calcium, Ion: 0.99 mmol/L — ABNORMAL LOW (ref 1.15–1.40)
HCT: 39 % (ref 39.0–52.0)
Hemoglobin: 13.3 g/dL (ref 13.0–17.0)
O2 Saturation: 86 %
Potassium: 3.8 mmol/L (ref 3.5–5.1)
Sodium: 136 mmol/L (ref 135–145)
TCO2: 30 mmol/L (ref 22–32)
pCO2, Ven: 47.9 mmHg (ref 44.0–60.0)
pH, Ven: 7.385 (ref 7.250–7.430)
pO2, Ven: 53 mmHg — ABNORMAL HIGH (ref 32.0–45.0)

## 2021-01-23 LAB — CBC WITH DIFFERENTIAL/PLATELET
Abs Immature Granulocytes: 0.2 10*3/uL — ABNORMAL HIGH (ref 0.00–0.07)
Basophils Absolute: 0.1 10*3/uL (ref 0.0–0.1)
Basophils Relative: 0 %
Eosinophils Absolute: 0 10*3/uL (ref 0.0–0.5)
Eosinophils Relative: 0 %
HCT: 39 % (ref 39.0–52.0)
Hemoglobin: 12.2 g/dL — ABNORMAL LOW (ref 13.0–17.0)
Immature Granulocytes: 1 %
Lymphocytes Relative: 4 %
Lymphs Abs: 0.8 10*3/uL (ref 0.7–4.0)
MCH: 29.6 pg (ref 26.0–34.0)
MCHC: 31.3 g/dL (ref 30.0–36.0)
MCV: 94.7 fL (ref 80.0–100.0)
Monocytes Absolute: 1.7 10*3/uL — ABNORMAL HIGH (ref 0.1–1.0)
Monocytes Relative: 8 %
Neutro Abs: 19.2 10*3/uL — ABNORMAL HIGH (ref 1.7–7.7)
Neutrophils Relative %: 87 %
Platelets: 213 10*3/uL (ref 150–400)
RBC: 4.12 MIL/uL — ABNORMAL LOW (ref 4.22–5.81)
RDW: 12.9 % (ref 11.5–15.5)
WBC: 22 10*3/uL — ABNORMAL HIGH (ref 4.0–10.5)
nRBC: 0 % (ref 0.0–0.2)

## 2021-01-23 LAB — FERRITIN: Ferritin: 263 ng/mL (ref 24–336)

## 2021-01-23 LAB — AMMONIA: Ammonia: 11 umol/L (ref 9–35)

## 2021-01-23 LAB — COMPREHENSIVE METABOLIC PANEL
ALT: 15 U/L (ref 0–44)
AST: 27 U/L (ref 15–41)
Albumin: 2.7 g/dL — ABNORMAL LOW (ref 3.5–5.0)
Alkaline Phosphatase: 91 U/L (ref 38–126)
Anion gap: 12 (ref 5–15)
BUN: 30 mg/dL — ABNORMAL HIGH (ref 6–20)
CO2: 24 mmol/L (ref 22–32)
Calcium: 8.1 mg/dL — ABNORMAL LOW (ref 8.9–10.3)
Chloride: 99 mmol/L (ref 98–111)
Creatinine, Ser: 2.41 mg/dL — ABNORMAL HIGH (ref 0.61–1.24)
GFR, Estimated: 32 mL/min — ABNORMAL LOW (ref >60)
Glucose, Bld: 355 mg/dL — ABNORMAL HIGH (ref 70–99)
Potassium: 3.8 mmol/L (ref 3.5–5.1)
Sodium: 135 mmol/L (ref 135–145)
Total Bilirubin: 1.2 mg/dL (ref 0.3–1.2)
Total Protein: 7.3 g/dL (ref 6.5–8.1)

## 2021-01-23 LAB — TROPONIN I (HIGH SENSITIVITY)
Troponin I (High Sensitivity): 399 ng/L (ref <18)
Troponin I (High Sensitivity): 489 ng/L (ref <18)

## 2021-01-23 LAB — HIV ANTIBODY (ROUTINE TESTING W REFLEX): HIV Screen 4th Generation wRfx: NONREACTIVE

## 2021-01-23 LAB — RESP PANEL BY RT-PCR (FLU A&B, COVID) ARPGX2
Influenza A by PCR: NEGATIVE
Influenza B by PCR: NEGATIVE
SARS Coronavirus 2 by RT PCR: POSITIVE — AB

## 2021-01-23 LAB — PHOSPHORUS: Phosphorus: 2.3 mg/dL — ABNORMAL LOW (ref 2.5–4.6)

## 2021-01-23 LAB — LACTIC ACID, PLASMA
Lactic Acid, Venous: 2 mmol/L (ref 0.5–1.9)
Lactic Acid, Venous: 1.9 mmol/L (ref 0.5–1.9)
Lactic Acid, Venous: 1.8 mmol/L (ref 0.5–1.9)
Lactic Acid, Venous: 2.2 mmol/L (ref 0.5–1.9)

## 2021-01-23 LAB — PROCALCITONIN
Procalcitonin: 2.44 ng/mL
Procalcitonin: 2.65 ng/mL

## 2021-01-23 LAB — APTT: aPTT: 34 seconds (ref 24–36)

## 2021-01-23 LAB — ETHANOL: Alcohol, Ethyl (B): <10 mg/dL (ref <10)

## 2021-01-23 LAB — BRAIN NATRIURETIC PEPTIDE: B Natriuretic Peptide: 509.9 pg/mL — ABNORMAL HIGH (ref 0.0–100.0)

## 2021-01-23 LAB — CBG MONITORING, ED: Glucose-Capillary: 343 mg/dL — ABNORMAL HIGH (ref 70–99)

## 2021-01-23 LAB — MAGNESIUM: Magnesium: 1.8 mg/dL (ref 1.7–2.4)

## 2021-01-23 LAB — POC SARS CORONAVIRUS 2 AG -  ED: SARSCOV2ONAVIRUS 2 AG: NEGATIVE

## 2021-01-23 LAB — CK: Total CK: 431 U/L — ABNORMAL HIGH (ref 49–397)

## 2021-01-23 LAB — C-REACTIVE PROTEIN: CRP: 28.5 mg/dL — ABNORMAL HIGH (ref <1.0)

## 2021-01-23 LAB — LACTATE DEHYDROGENASE: LDH: 233 U/L — ABNORMAL HIGH (ref 98–192)

## 2021-01-23 MED ORDER — INSULIN ASPART 100 UNIT/ML IJ SOLN
0.0000 [IU] | INTRAMUSCULAR | Status: DC
  Administered 2021-01-23: 7 [IU] via SUBCUTANEOUS
  Administered 2021-01-24: 1 [IU] via SUBCUTANEOUS
  Administered 2021-01-24: 3 [IU] via SUBCUTANEOUS
  Administered 2021-01-24: 2 [IU] via SUBCUTANEOUS
  Administered 2021-01-24: 5 [IU] via SUBCUTANEOUS
  Administered 2021-01-24 – 2021-01-25 (×2): 2 [IU] via SUBCUTANEOUS
  Administered 2021-01-25: 3 [IU] via SUBCUTANEOUS
  Administered 2021-01-25: 5 [IU] via SUBCUTANEOUS
  Administered 2021-01-26: 1 [IU] via SUBCUTANEOUS
  Administered 2021-01-26: 3 [IU] via SUBCUTANEOUS
  Administered 2021-01-26: 2 [IU] via SUBCUTANEOUS
  Administered 2021-01-26 – 2021-01-28 (×5): 1 [IU] via SUBCUTANEOUS
  Administered 2021-01-28: 2 [IU] via SUBCUTANEOUS
  Administered 2021-01-28 – 2021-01-29 (×2): 1 [IU] via SUBCUTANEOUS
  Administered 2021-01-29 – 2021-01-30 (×2): 2 [IU] via SUBCUTANEOUS
  Administered 2021-01-30: 1 [IU] via SUBCUTANEOUS
  Administered 2021-01-30: 2 [IU] via SUBCUTANEOUS
  Administered 2021-01-31 (×2): 1 [IU] via SUBCUTANEOUS
  Administered 2021-01-31: 3 [IU] via SUBCUTANEOUS
  Administered 2021-01-31 – 2021-02-01 (×2): 1 [IU] via SUBCUTANEOUS
  Administered 2021-02-01 (×2): 2 [IU] via SUBCUTANEOUS
  Administered 2021-02-01 (×3): 1 [IU] via SUBCUTANEOUS
  Administered 2021-02-02: 2 [IU] via SUBCUTANEOUS
  Administered 2021-02-02 (×2): 1 [IU] via SUBCUTANEOUS
  Administered 2021-02-02: 2 [IU] via SUBCUTANEOUS
  Administered 2021-02-02: 1 [IU] via SUBCUTANEOUS
  Administered 2021-02-03: 2 [IU] via SUBCUTANEOUS
  Administered 2021-02-04 (×3): 1 [IU] via SUBCUTANEOUS
  Administered 2021-02-04: 2 [IU] via SUBCUTANEOUS
  Administered 2021-02-05: 1 [IU] via SUBCUTANEOUS
  Administered 2021-02-05: 2 [IU] via SUBCUTANEOUS
  Administered 2021-02-05 – 2021-02-06 (×2): 1 [IU] via SUBCUTANEOUS
  Administered 2021-02-07: 2 [IU] via SUBCUTANEOUS

## 2021-01-23 MED ORDER — PIPERACILLIN-TAZOBACTAM 3.375 G IVPB 30 MIN
3.3750 g | Freq: Once | INTRAVENOUS | Status: AC
  Administered 2021-01-23: 3.375 g via INTRAVENOUS
  Filled 2021-01-23: qty 50

## 2021-01-23 MED ORDER — PIPERACILLIN-TAZOBACTAM 3.375 G IVPB
3.3750 g | Freq: Three times a day (TID) | INTRAVENOUS | Status: AC
  Administered 2021-01-24 – 2021-01-26 (×9): 3.375 g via INTRAVENOUS
  Filled 2021-01-23 (×9): qty 50

## 2021-01-23 MED ORDER — INSULIN GLARGINE 100 UNIT/ML ~~LOC~~ SOLN
60.0000 [IU] | Freq: Every day | SUBCUTANEOUS | Status: DC
  Administered 2021-01-23: 60 [IU] via SUBCUTANEOUS
  Filled 2021-01-23 (×2): qty 0.6

## 2021-01-23 MED ORDER — ROSUVASTATIN CALCIUM 20 MG PO TABS
20.0000 mg | ORAL_TABLET | Freq: Every day | ORAL | Status: DC
  Administered 2021-01-24 – 2021-02-07 (×14): 20 mg via ORAL
  Filled 2021-01-23 (×14): qty 1

## 2021-01-23 MED ORDER — SODIUM CHLORIDE 0.9 % IV SOLN
100.0000 mg | Freq: Every day | INTRAVENOUS | Status: AC
  Administered 2021-01-24 – 2021-01-25 (×2): 100 mg via INTRAVENOUS
  Filled 2021-01-23 (×2): qty 20

## 2021-01-23 MED ORDER — ACETAMINOPHEN 325 MG PO TABS
650.0000 mg | ORAL_TABLET | Freq: Four times a day (QID) | ORAL | Status: DC | PRN
  Administered 2021-01-24 – 2021-01-25 (×2): 650 mg via ORAL
  Filled 2021-01-23 (×2): qty 2

## 2021-01-23 MED ORDER — HEPARIN BOLUS VIA INFUSION
4000.0000 [IU] | Freq: Once | INTRAVENOUS | Status: AC
  Administered 2021-01-23: 4000 [IU] via INTRAVENOUS
  Filled 2021-01-23: qty 4000

## 2021-01-23 MED ORDER — VANCOMYCIN HCL 10 G IV SOLR
2500.0000 mg | Freq: Once | INTRAVENOUS | Status: AC
  Administered 2021-01-23: 2500 mg via INTRAVENOUS
  Filled 2021-01-23: qty 2500

## 2021-01-23 MED ORDER — ACETAMINOPHEN 325 MG PO TABS
650.0000 mg | ORAL_TABLET | Freq: Once | ORAL | Status: AC
  Administered 2021-01-23: 650 mg via ORAL
  Filled 2021-01-23: qty 2

## 2021-01-23 MED ORDER — ASPIRIN EC 81 MG PO TBEC
81.0000 mg | DELAYED_RELEASE_TABLET | Freq: Every day | ORAL | Status: DC
  Administered 2021-01-24 – 2021-02-07 (×14): 81 mg via ORAL
  Filled 2021-01-23 (×14): qty 1

## 2021-01-23 MED ORDER — VANCOMYCIN HCL 1500 MG/300ML IV SOLN: 1500.0000 mg | INTRAVENOUS | Status: DC

## 2021-01-23 MED ORDER — ACETAMINOPHEN 650 MG RE SUPP: 650.0000 mg | Freq: Four times a day (QID) | RECTAL | Status: DC | PRN

## 2021-01-23 MED ORDER — HYDROCODONE-ACETAMINOPHEN 5-325 MG PO TABS
1.0000 | ORAL_TABLET | ORAL | Status: DC | PRN
  Administered 2021-01-24 – 2021-01-25 (×2): 1 via ORAL
  Filled 2021-01-23 (×2): qty 1

## 2021-01-23 MED ORDER — SODIUM CHLORIDE 0.9 % IV SOLN
75.0000 mL/h | INTRAVENOUS | Status: AC
  Administered 2021-01-23: 75 mL/h via INTRAVENOUS

## 2021-01-23 MED ORDER — ROSUVASTATIN CALCIUM 20 MG PO TABS: 20.0000 mg | ORAL_TABLET | Freq: Every day | ORAL | Status: DC

## 2021-01-23 MED ORDER — SODIUM CHLORIDE 0.9 % IV SOLN
200.0000 mg | Freq: Once | INTRAVENOUS | Status: AC
  Administered 2021-01-23: 200 mg via INTRAVENOUS
  Filled 2021-01-23: qty 40

## 2021-01-23 MED ORDER — HEPARIN (PORCINE) 25000 UT/250ML-% IV SOLN
1800.0000 [IU]/h | INTRAVENOUS | Status: DC
  Administered 2021-01-23: 1350 [IU]/h via INTRAVENOUS
  Filled 2021-01-23 (×2): qty 250

## 2021-01-23 MED ORDER — LACTATED RINGERS IV SOLN: INTRAVENOUS | Status: DC

## 2021-01-23 NOTE — Progress Notes (Signed)
Pt being followed by [facility] for Sepsis protocol. 
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
## 2021-01-23 NOTE — Progress Notes (Signed)
VASCULAR LAB    Right lower extremity venous duplex has been performed.  See CV proc for preliminary results.  Messaged results to Dr. Madilyn Hook via secure chat.  Joby Hershkowitz, RVT 01/23/2021, 6:28 PM

## 2021-01-23 NOTE — ED Notes (Signed)
Hospital bed ordered.

## 2021-01-23 NOTE — Progress Notes (Signed)
Pharmacy Antibiotic Note  Mitchell [name] is a 50 y.o. male admitted on 01/23/2021 with  r/o necrotizing fasciitis .  Pharmacy has been consulted for Zosyn and vancomycin dosing.  WBC elevated at 22. SCr elevated at 2.3 (BL ~ 1.15)  Plan: -Start Zosyn 3.375 gm IV Q 8 hours (EI infusion) -Vancomycin 2500 mg IV load followed by vancomycin 1500 mg IV Q 24 hours per traditional nomogram -Monitor CBC, renal fx, cultures and clinical progress -Vanc levels as indicated      Temp (24hrs), Avg:102.4 F (39.1 C), Min:102.4 F (39.1 C), Max:102.4 F (39.1 C)  No results for input(s): WBC, CREATININE, LATICACIDVEN, VANCOTROUGH, VANCOPEAK, VANCORANDOM, GENTTROUGH, GENTPEAK, GENTRANDOM, TOBRATROUGH, TOBRAPEAK, TOBRARND, AMIKACINPEAK, AMIKACINTROU, AMIKACIN in the last 168 hours.  CrCl cannot be calculated (Patient's most recent lab result is older than the maximum 21 days allowed.).    Allergies  Allergen Reactions   Nsaids Anaphylaxis and Other (See Comments)    Able to tolerate aspirin     Antimicrobials this admission: Zosyn 6/26 >>  Vancomycin 6/26 >>   Dose adjustments this admission:  Microbiology results: 6/26 BCx:  6/26 UCx:     Thank you for allowing pharmacy to be a part of this patient's care.  Vinnie Level, PharmD., BCPS, BCCCP Clinical Pharmacist Please refer to [facility] for unit-specific pharmacist
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
## 2021-01-23 NOTE — ED Triage Notes (Signed)
Pt from home BIB [facility] for AMS, fever, foot pain, boil on right foot, and HA. Pt tested COVID+ on 01/18/21. BGL 428. Pt noncompliant with meds. 4mg  zofran IV and 500 mL NS given PTA. Pt reports left sided/nonradiating chest pain/tightness on arrival.

## 2021-01-23 NOTE — ED Notes (Signed)
Lactic 2.0 PER LAB

## 2021-01-23 NOTE — Progress Notes (Signed)
ANTICOAGULATION CONSULT NOTE - Initial Consult  Pharmacy Consult for Heparin Indication: chest pain/ACS  Allergies  Allergen Reactions   Nsaids Anaphylaxis and Other (See Comments)    Able to tolerate aspirin     Patient Measurements: Ht: 71.5 in Weight: (!) 137 kg (302 lb 0.5 oz) Heparin Dosing Weight: 94 kg  Vital Signs: Temp: 100.2 F (37.9 C) (06/26 2200) Temp Source: Oral (06/26 2200) BP: 108/71 (06/26 2200) Pulse Rate: 93 (06/26 2200)  Labs: Recent Labs    01/23/21 1729 01/23/21 1737 01/23/21 1755 01/23/21 2136  HGB 12.2* 13.6  13.3  --   --   HCT 39.0 40.0  39.0  --   --   PLT 213  --   --   --   APTT  --   --  34  --   LABPROT  --   --  15.5*  --   INR  --   --  1.2  --   CREATININE 2.41* 2.30*  --   --   CKTOTAL  --   --   --  431*  TROPONINIHS 399*  --   --  489*    Estimated Creatinine Clearance: 53.5 mL/min (A) (by C-G formula based on SCr of 2.3 mg/dL (H)).   Medical History: Past Medical History:  Diagnosis Date   Bipolar disorder (HCC)    CAD (coronary artery disease)    a. diffuse 3v CAD by cath 2019, medical therapy recommended.   Cataract    forming    Chronic systolic CHF (congestive heart failure) (HCC)    CVA (cerebral infarction)    No residual deficits   Depression    PTSD,    Diabetes mellitus    TYPE II; UNCONTROLLED BY HEMOGLOBIN A1c; STABLE AS  PER DISCHARGE   Headache(784.0)    Herpes simplex of male genitalia    History of colonic polyps    Hyperlipidemia    Hypertension    Myocardial infarction (HCC) 1987   (while playing football)   Obesity    OSA (obstructive sleep apnea)    repeat study 2018 without significant OSA   Pneumonia    Post-cardiac injury syndrome (HCC)    History of cardiac injury from blunt trauma   Pulmonary hypertension (HCC)    a. moderately elevated PASP 07/2019.   PVCs (premature ventricular contractions)    Schizophrenia (HCC)    [facility]es to [facility]   Sleep apnea     Stroke [facility]) 2005   some left side weakness   Syncope    Recurrent, thought to be vasovagal. Also has h/o frequent PVCs.     Medications:  Awaiting electronic med rec  Assessment: 50 y.o. M presents with R foot cellulitis. Known to pharmacy from antibiotic dosing. Pt with CP and elevated troponin. No AC PTA. To begin heparin per pharmacy. Noted pt with some BRBPR over past 2 days but none currently - MD aware. CBC stable on admission.  Goal of Therapy:  Heparin level 0.3-0.7 units/ml Monitor platelets by anticoagulation protocol: Yes   Plan:  Heparin IV bolus 4000 units Heparin gtt at 1350 units/hr Will f/u heparin level in 6 hours Daily heparin level and CBC  Christoper Fabian, PharmD, BCPS Please see amion for complete clinical pharmacist phone list 01/23/2021,10:23 PM

## 2021-01-24 ENCOUNTER — Inpatient Hospital Stay (HOSPITAL_COMMUNITY): Payer: No Typology Code available for payment source

## 2021-01-24 ENCOUNTER — Other Ambulatory Visit: Payer: Self-pay | Admitting: Physician Assistant

## 2021-01-24 DIAGNOSIS — G4733 Obstructive sleep apnea (adult) (pediatric): Secondary | ICD-10-CM

## 2021-01-24 DIAGNOSIS — L089 Local infection of the skin and subcutaneous tissue, unspecified: Secondary | ICD-10-CM | POA: Insufficient documentation

## 2021-01-24 DIAGNOSIS — Z794 Long term (current) use of insulin: Secondary | ICD-10-CM

## 2021-01-24 DIAGNOSIS — E11628 Type 2 diabetes mellitus with other skin complications: Secondary | ICD-10-CM

## 2021-01-24 DIAGNOSIS — R0602 Shortness of breath: Secondary | ICD-10-CM | POA: Insufficient documentation

## 2021-01-24 DIAGNOSIS — R079 Chest pain, unspecified: Secondary | ICD-10-CM

## 2021-01-24 DIAGNOSIS — N179 Acute kidney failure, unspecified: Secondary | ICD-10-CM

## 2021-01-24 DIAGNOSIS — R9431 Abnormal electrocardiogram [ECG] [EKG]: Secondary | ICD-10-CM

## 2021-01-24 DIAGNOSIS — G9340 Encephalopathy, unspecified: Secondary | ICD-10-CM

## 2021-01-24 DIAGNOSIS — I10 Essential (primary) hypertension: Secondary | ICD-10-CM

## 2021-01-24 DIAGNOSIS — R778 Other specified abnormalities of plasma proteins: Secondary | ICD-10-CM

## 2021-01-24 DIAGNOSIS — E1165 Type 2 diabetes mellitus with hyperglycemia: Secondary | ICD-10-CM

## 2021-01-24 DIAGNOSIS — G9341 Metabolic encephalopathy: Secondary | ICD-10-CM

## 2021-01-24 LAB — CBG MONITORING, ED
Glucose-Capillary: 292 mg/dL — ABNORMAL HIGH (ref 70–99)
Glucose-Capillary: 236 mg/dL — ABNORMAL HIGH (ref 70–99)
Glucose-Capillary: 199 mg/dL — ABNORMAL HIGH (ref 70–99)
Glucose-Capillary: 195 mg/dL — ABNORMAL HIGH (ref 70–99)

## 2021-01-24 LAB — URINALYSIS, ROUTINE W REFLEX MICROSCOPIC
Bilirubin Urine: NEGATIVE
Glucose, UA: 50 mg/dL — AB
Hgb urine dipstick: NEGATIVE
Ketones, ur: NEGATIVE mg/dL
Leukocytes,Ua: NEGATIVE
Nitrite: NEGATIVE
Protein, ur: 100 mg/dL — AB
Specific Gravity, Urine: 1.023 (ref 1.005–1.030)
pH: 5 (ref 5.0–8.0)

## 2021-01-24 LAB — D-DIMER, QUANTITATIVE
D-Dimer, Quant: 2.8 ug/mL-FEU — ABNORMAL HIGH (ref 0.00–0.50)
D-Dimer, Quant: 3.07 ug/mL-FEU — ABNORMAL HIGH (ref 0.00–0.50)

## 2021-01-24 LAB — COMPREHENSIVE METABOLIC PANEL
ALT: 14 U/L (ref 0–44)
AST: 30 U/L (ref 15–41)
Albumin: 2.4 g/dL — ABNORMAL LOW (ref 3.5–5.0)
Alkaline Phosphatase: 75 U/L (ref 38–126)
Anion gap: 10 (ref 5–15)
BUN: 35 mg/dL — ABNORMAL HIGH (ref 6–20)
CO2: 26 mmol/L (ref 22–32)
Calcium: 7.7 mg/dL — ABNORMAL LOW (ref 8.9–10.3)
Chloride: 99 mmol/L (ref 98–111)
Creatinine, Ser: 3.06 mg/dL — ABNORMAL HIGH (ref 0.61–1.24)
GFR, Estimated: 24 mL/min — ABNORMAL LOW (ref >60)
Glucose, Bld: 256 mg/dL — ABNORMAL HIGH (ref 70–99)
Potassium: 3.5 mmol/L (ref 3.5–5.1)
Sodium: 135 mmol/L (ref 135–145)
Total Bilirubin: 0.8 mg/dL (ref 0.3–1.2)
Total Protein: 6.6 g/dL (ref 6.5–8.1)

## 2021-01-24 LAB — CBC WITH DIFFERENTIAL/PLATELET
Abs Immature Granulocytes: 0.24 10*3/uL — ABNORMAL HIGH (ref 0.00–0.07)
Basophils Absolute: 0 10*3/uL (ref 0.0–0.1)
Basophils Relative: 0 %
Eosinophils Absolute: 0 10*3/uL (ref 0.0–0.5)
Eosinophils Relative: 0 %
HCT: 35.8 % — ABNORMAL LOW (ref 39.0–52.0)
Hemoglobin: 11.4 g/dL — ABNORMAL LOW (ref 13.0–17.0)
Immature Granulocytes: 1 %
Lymphocytes Relative: 8 %
Lymphs Abs: 1.7 10*3/uL (ref 0.7–4.0)
MCH: 30 pg (ref 26.0–34.0)
MCHC: 31.8 g/dL (ref 30.0–36.0)
MCV: 94.2 fL (ref 80.0–100.0)
Monocytes Absolute: 1.6 10*3/uL — ABNORMAL HIGH (ref 0.1–1.0)
Monocytes Relative: 7 %
Neutro Abs: 18.5 10*3/uL — ABNORMAL HIGH (ref 1.7–7.7)
Neutrophils Relative %: 84 %
Platelets: 218 10*3/uL (ref 150–400)
RBC: 3.8 MIL/uL — ABNORMAL LOW (ref 4.22–5.81)
RDW: 12.9 % (ref 11.5–15.5)
WBC: 22.1 10*3/uL — ABNORMAL HIGH (ref 4.0–10.5)
nRBC: 0 % (ref 0.0–0.2)

## 2021-01-24 LAB — RAPID URINE DRUG SCREEN, HOSP PERFORMED
Amphetamines: NOT DETECTED
Barbiturates: NOT DETECTED
Benzodiazepines: NOT DETECTED
Cocaine: NOT DETECTED
Opiates: NOT DETECTED
Tetrahydrocannabinol: NOT DETECTED

## 2021-01-24 LAB — FIBRINOGEN: Fibrinogen: >800 mg/dL — ABNORMAL HIGH (ref 210–475)

## 2021-01-24 LAB — PROCALCITONIN: Procalcitonin: 2.98 ng/mL

## 2021-01-24 LAB — BRAIN NATRIURETIC PEPTIDE: B Natriuretic Peptide: 522.3 pg/mL — ABNORMAL HIGH (ref 0.0–100.0)

## 2021-01-24 LAB — C-REACTIVE PROTEIN: CRP: 29.2 mg/dL — ABNORMAL HIGH (ref <1.0)

## 2021-01-24 LAB — HEPARIN LEVEL (UNFRACTIONATED): Heparin Unfractionated: <0.1 IU/mL — ABNORMAL LOW (ref 0.30–0.70)

## 2021-01-24 LAB — PHOSPHORUS: Phosphorus: 2.2 mg/dL — ABNORMAL LOW (ref 2.5–4.6)

## 2021-01-24 LAB — TROPONIN I (HIGH SENSITIVITY)
Troponin I (High Sensitivity): 493 ng/L (ref <18)
Troponin I (High Sensitivity): 534 ng/L (ref <18)

## 2021-01-24 LAB — GLUCOSE, CAPILLARY
Glucose-Capillary: 109 mg/dL — ABNORMAL HIGH (ref 70–99)
Glucose-Capillary: 117 mg/dL — ABNORMAL HIGH (ref 70–99)
Glucose-Capillary: 140 mg/dL — ABNORMAL HIGH (ref 70–99)

## 2021-01-24 LAB — ECHOCARDIOGRAM LIMITED
Area-P 1/2: 5.54 cm2
S' Lateral: 5.9 cm
Weight: 4832.48 oz

## 2021-01-24 LAB — MAGNESIUM: Magnesium: 1.9 mg/dL (ref 1.7–2.4)

## 2021-01-24 LAB — CREATININE, URINE, RANDOM: Creatinine, Urine: 273.3 mg/dL

## 2021-01-24 LAB — CK: Total CK: 433 U/L — ABNORMAL HIGH (ref 49–397)

## 2021-01-24 LAB — SODIUM, URINE, RANDOM: Sodium, Ur: 20 mmol/L

## 2021-01-24 LAB — TSH: TSH: 1.123 u[IU]/mL (ref 0.350–4.500)

## 2021-01-24 MED ORDER — LACTATED RINGERS IV SOLN: INTRAVENOUS | Status: DC

## 2021-01-24 MED ORDER — INSULIN GLARGINE 100 UNIT/ML ~~LOC~~ SOLN
20.0000 [IU] | Freq: Two times a day (BID) | SUBCUTANEOUS | Status: DC
  Administered 2021-01-24 – 2021-01-25 (×2): 20 [IU] via SUBCUTANEOUS
  Filled 2021-01-24 (×4): qty 0.2

## 2021-01-24 MED ORDER — POTASSIUM CHLORIDE CRYS ER 20 MEQ PO TBCR
20.0000 meq | EXTENDED_RELEASE_TABLET | Freq: Once | ORAL | Status: AC
  Administered 2021-01-24: 20 meq via ORAL
  Filled 2021-01-24: qty 1

## 2021-01-24 MED ORDER — HEPARIN BOLUS VIA INFUSION
4000.0000 [IU] | Freq: Once | INTRAVENOUS | Status: AC
  Administered 2021-01-24: 4000 [IU] via INTRAVENOUS
  Filled 2021-01-24: qty 4000

## 2021-01-24 MED ORDER — TRAZODONE HCL 50 MG PO TABS
50.0000 mg | ORAL_TABLET | Freq: Every evening | ORAL | Status: AC | PRN
  Administered 2021-01-24 – 2021-01-26 (×2): 50 mg via ORAL
  Filled 2021-01-24 (×2): qty 1

## 2021-01-24 MED ORDER — MAGNESIUM SULFATE 2 GM/50ML IV SOLN
2.0000 g | Freq: Once | INTRAVENOUS | Status: AC
  Administered 2021-01-24: 2 g via INTRAVENOUS
  Filled 2021-01-24: qty 50

## 2021-01-24 MED ORDER — LINEZOLID 600 MG/300ML IV SOLN
600.0000 mg | Freq: Two times a day (BID) | INTRAVENOUS | Status: AC
  Administered 2021-01-24 – 2021-01-26 (×6): 600 mg via INTRAVENOUS
  Filled 2021-01-24 (×6): qty 300

## 2021-01-24 MED ORDER — METOPROLOL TARTRATE 12.5 MG HALF TABLET
12.5000 mg | ORAL_TABLET | Freq: Two times a day (BID) | ORAL | Status: AC
  Administered 2021-01-24 – 2021-01-27 (×6): 12.5 mg via ORAL
  Filled 2021-01-24 (×7): qty 1

## 2021-01-24 MED ORDER — SODIUM CHLORIDE 0.9 % IV SOLN
75.0000 mL/h | INTRAVENOUS | Status: DC
  Administered 2021-01-24: 75 mL/h via INTRAVENOUS

## 2021-01-24 MED ORDER — VANCOMYCIN VARIABLE DOSE PER UNSTABLE RENAL FUNCTION (PHARMACIST DOSING): Status: DC

## 2021-01-24 MED ORDER — ONDANSETRON HCL 4 MG/2ML IJ SOLN
4.0000 mg | Freq: Four times a day (QID) | INTRAMUSCULAR | Status: DC | PRN
  Administered 2021-01-25 – 2021-02-05 (×9): 4 mg via INTRAVENOUS
  Filled 2021-01-24 (×10): qty 2

## 2021-01-24 MED ORDER — PERFLUTREN LIPID MICROSPHERE
1.0000 mL | INTRAVENOUS | Status: AC | PRN
  Administered 2021-01-24: 2 mL via INTRAVENOUS
  Filled 2021-01-24: qty 10

## 2021-01-24 MED ORDER — LOPERAMIDE HCL 2 MG PO CAPS
2.0000 mg | ORAL_CAPSULE | Freq: Four times a day (QID) | ORAL | Status: DC | PRN
Start: 2021-01-24 — End: 2021-02-07
  Administered 2021-01-24 – 2021-01-26 (×3): 2 mg via ORAL
  Filled 2021-01-24 (×3): qty 1

## 2021-01-24 MED ORDER — POTASSIUM PHOSPHATES 15 MMOLE/5ML IV SOLN
30.0000 mmol | Freq: Once | INTRAVENOUS | Status: AC
  Administered 2021-01-24: 30 mmol via INTRAVENOUS
  Filled 2021-01-24: qty 10

## 2021-01-24 MED ORDER — HEPARIN SODIUM (PORCINE) 5000 UNIT/ML IJ SOLN
5000.0000 [IU] | Freq: Three times a day (TID) | INTRAMUSCULAR | Status: DC
  Administered 2021-01-24 – 2021-01-25 (×2): 5000 [IU] via SUBCUTANEOUS
  Filled 2021-01-24 (×3): qty 1

## 2021-01-24 MED ORDER — PANTOPRAZOLE SODIUM 40 MG PO TBEC
40.0000 mg | DELAYED_RELEASE_TABLET | Freq: Every day | ORAL | Status: DC
  Administered 2021-01-24 – 2021-02-07 (×14): 40 mg via ORAL
  Filled 2021-01-24 (×14): qty 1

## 2021-01-24 NOTE — Consult Note (Signed)
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 (PE) and deep venous thrombosis (DVT) in outpatients suspected of PE or DVT. Results should be correlated with clinical presentation. Performed at [facility], [address] [place], [place] [address]   Fibrinogen     Status: Abnormal   Collection Time: 01/23/21   9:45 PM  Result Value Ref Range   Fibrinogen >800 (H) 210 - 475 mg/dL    Comment: REPEATED TO VERIFY Performed at [facility], [address], [place], [place] [address]   Lactic acid, plasma     Status: Abnormal   Collection Time: 01/23/21 10:45 PM  Result Value Ref Range   Lactic Acid, Venous 2.2 (HH) 0.5 - 1.9 mmol/L    Comment: CRITICAL RESULT CALLED TO, READ BACK BY AND VERIFIED WITH: TATE G,RN 01/23/21 2319 WAYK Performed at [facility], [address], [place], [place] [address]   CBG monitoring, ED     Status: Abnormal   Collection Time: 01/24/21  1:18 AM  Result Value Ref Range   Glucose-Capillary 292 (H) 70 - 99 mg/dL    Comment:  Glucose reference range applies only to samples taken after fasting for at least 8 hours.  CBC with Differential/Platelet     Status: Abnormal   Collection Time: 01/24/21  2:30 AM  Result Value Ref Range   WBC 22.1 (H) 4.0 - 10.5 K/uL   RBC 3.80 (L) 4.22 - 5.81 MIL/uL   Hemoglobin 11.4 (L) 13.0 - 17.0 g/dL   HCT 40.9 (L) 81.1 - 91.4 %   MCV 94.2 80.0 - 100.0 fL   MCH 30.0 26.0 - 34.0 pg   MCHC 31.8 30.0 - 36.0 g/dL   RDW 78.2 95.6 - 21.3 %   Platelets 218 150 - 400 K/uL   nRBC 0.0 0.0 - 0.2 %   Neutrophils Relative % 84 %   Neutro Abs 18.5 (H) 1.7 - 7.7 K/uL   Lymphocytes Relative 8 %   Lymphs Abs 1.7 0.7 - 4.0 K/uL   Monocytes Relative 7 %   Monocytes Absolute 1.6 (H) 0.1 - 1.0 K/uL   Eosinophils Relative 0 %   Eosinophils Absolute 0.0 0.0 - 0.5 K/uL   Basophils Relative 0 %   Basophils Absolute 0.0 0.0 - 0.1 K/uL   Immature Granulocytes 1 %   Abs Immature Granulocytes 0.24 (H) 0.00 - 0.07 K/uL    Comment: Performed at [facility], [address] [place], [place] [address]  Comprehensive metabolic panel     Status: Abnormal   Collection Time: 01/24/21  2:30 AM  Result Value Ref Range   Sodium 135 135 - 145 mmol/L   Potassium 3.5 3.5 - 5.1 mmol/L   Chloride 99 98 - 111 mmol/L   CO2 26 22 - 32 mmol/L    Glucose, Bld 256 (H) 70 - 99 mg/dL    Comment: Glucose reference range applies only to samples taken after fasting for at least 8 hours.   BUN 35 (H) 6 - 20 mg/dL   Creatinine, Ser 8.46 (H) 0.61 - 1.24 mg/dL   Calcium 7.7 (L) 8.9 - 10.3 mg/dL   Total Protein 6.6 6.5 - 8.1 g/dL   Albumin 2.4 (L) 3.5 - 5.0 g/dL   AST 30 15 - 41 U/L   ALT 14 0 - 44 U/L   Alkaline Phosphatase 75 38 - 126 U/L   Total Bilirubin 0.8 0.3 - 1.2 mg/dL   GFR, Estimated 24 (L) >60 mL/min    Comment: (NOTE) Calculated using the CKD-EPI Creatinine Equation (2021)    Anion gap 10 5 - 15    Comment: Performed at [facility], [address], [place], [place] [address]  C-reactive protein     Status: Abnormal   Collection Time: 01/24/21  2:30 AM  Result Value Ref Range   CRP 29.2 (H) <1.0 mg/dL    Comment: Performed at [facility], [address], [place], [place] [address]  D-dimer, quantitative     Status: Abnormal   Collection Time: 01/24/21  2:30 AM  Result Value Ref Range   D-Dimer, Quant 3.07 (H) 0.00 - 0.50 ug/mL-FEU    Comment: (NOTE) At the manufacturer cut-off value of 0.5 g/mL FEU, this assay has a negative predictive value of 95-100%.This assay is intended for use in conjunction with a clinical pretest probability (PTP) assessment model to exclude pulmonary embolism (PE) and deep venous thrombosis (DVT) in outpatients suspected of PE or DVT. Results should be correlated with clinical presentation. Performed at [facility], [address], [place], [place] [address]  Magnesium     Status: None   Collection Time: 01/24/21  2:30 AM  Result Value Ref Range   Magnesium 1.9 1.7 - 2.4 mg/dL    Comment: Performed at [facility], [address] [place], [place] [address]  Phosphorus     Status: Abnormal   Collection Time: 01/24/21  2:30 AM  Result Value Ref Range   Phosphorus 2.2 (L) 2.5 - 4.6 mg/dL    Comment: Performed at [facility], [address],  [place], [place] [address]  TSH     Status: None   Collection Time: 01/24/21  2:30 AM  Result Value Ref Range   TSH 1.123 0.350 - 4.500 uIU/mL    Comment: Performed by a 3rd Generation assay with a functional sensitivity of <=0.01 uIU/mL. Performed at [facility], [address] [place], [place] [address]   Heparin level (unfractionated)     Status: Abnormal   Collection Time: 01/24/21  2:30 AM  Result Value Ref Range   Heparin Unfractionated <0.10 (L) 0.30 - 0.70 IU/mL    Comment: (NOTE) The clinical reportable range upper limit is being lowered to >1.10 to align with the FDA approved guidance for the current laboratory assay.  If heparin results are below expected values, and patient dosage has  been confirmed, suggest follow up testing of antithrombin III levels. Performed at [facility], [address] [place], [place] [address]   Troponin I (High Sensitivity)     Status: Abnormal   Collection Time: 01/24/21  2:30 AM  Result Value Ref Range   Troponin I (High Sensitivity) 493 (HH) <18 ng/L    Comment: CRITICAL VALUE NOTED.  VALUE IS CONSISTENT WITH PREVIOUSLY REPORTED AND CALLED VALUE. (NOTE) Elevated high sensitivity troponin I (hsTnI) values and significant  changes across serial measurements may suggest ACS but many other  chronic and acute conditions are known to elevate hsTnI results.  Refer to the Links section for chest pain algorithms and additional  guidance. Performed at [facility], [address], [place], [place] [address]   Brain natriuretic peptide     Status: Abnormal   Collection Time: 01/24/21  2:30 AM  Result Value Ref Range   B Natriuretic Peptide 522.3 (H) 0.0 - 100.0 pg/mL    Comment: Performed at [facility], [address], [place], [place] [address]  Procalcitonin - Baseline     Status: None   Collection Time: 01/24/21  2:30 AM  Result Value Ref Range   Procalcitonin 2.98 ng/mL    Comment:        Interpretation: PCT >  2 ng/mL: Systemic infection (sepsis) is likely, unless other causes are known. (NOTE)       Sepsis PCT Algorithm           Lower Respiratory Tract                                      Infection PCT Algorithm    ----------------------------     ----------------------------         PCT < 0.25 ng/mL                PCT < 0.10 ng/mL          Strongly encourage             Strongly discourage   discontinuation of  antibiotics    initiation of antibiotics    ----------------------------     -----------------------------       PCT 0.25 - 0.50 ng/mL            PCT 0.10 - 0.25 ng/mL               OR       >80% decrease in PCT            Discourage initiation of                                            antibiotics      Encourage discontinuation           of antibiotics    ----------------------------     -----------------------------         PCT >= 0.50 ng/mL              PCT 0.26 - 0.50 ng/mL               AND       <80% decrease in PCT              Encourage initiation of                                             antibiotics       Encourage continuation           of antibiotics    ----------------------------     -----------------------------        PCT >= 0.50 ng/mL                  PCT > 0.50 ng/mL               AND         increase in PCT                  Strongly encourage                                      initiation of antibiotics    Strongly encourage escalation           of antibiotics                                     -----------------------------                                           PCT <= 0.25 ng/mL                                                 OR                                        >  80% decrease in PCT                                      Discontinue / Do not initiate                                             antibiotics  Performed at [facility], [address], [place], [place] [address]   CBG monitoring, ED     Status: Abnormal   Collection  Time: 01/24/21  3:36 AM  Result Value Ref Range   Glucose-Capillary 236 (H) 70 - 99 mg/dL    Comment: Glucose reference range applies only to samples taken after fasting for at least 8 hours.  Urinalysis, Routine w reflex microscopic     Status: Abnormal   Collection Time: 01/24/21  3:39 AM  Result Value Ref Range   Color, Urine AMBER (A) YELLOW    Comment: BIOCHEMICALS MAY BE AFFECTED BY COLOR   APPearance TURBID (A) CLEAR   Specific Gravity, Urine 1.023 1.005 - 1.030   pH 5.0 5.0 - 8.0   Glucose, UA 50 (A) NEGATIVE mg/dL   Hgb urine dipstick NEGATIVE NEGATIVE   Bilirubin Urine NEGATIVE NEGATIVE   Ketones, ur NEGATIVE NEGATIVE mg/dL   Protein, ur 027 (A) NEGATIVE mg/dL   Nitrite NEGATIVE NEGATIVE   Leukocytes,Ua NEGATIVE NEGATIVE   RBC / HPF 0-5 0 - 5 RBC/hpf   Bacteria, UA MANY (A) NONE SEEN   Squamous Epithelial / LPF 0-5 0 - 5   Mucus PRESENT    Amorphous Crystal PRESENT     Comment: Performed at [facility], [address] [place], [place] [address]  Creatinine, urine, random     Status: None   Collection Time: 01/24/21  3:39 AM  Result Value Ref Range   Creatinine, Urine 273.30 mg/dL    Comment: Performed at [facility], [address] [place], [place] [address]  Sodium, urine, random     Status: None   Collection Time: 01/24/21  3:39 AM  Result Value Ref Range   Sodium, Ur 20 mmol/L    Comment: Performed at [facility], [address], [place], [place] [address]  Urine rapid drug screen (hosp performed)     Status: None   Collection Time: 01/24/21  3:39 AM  Result Value Ref Range   Opiates NONE DETECTED NONE DETECTED   Cocaine NONE DETECTED NONE DETECTED   Benzodiazepines NONE DETECTED NONE DETECTED   Amphetamines NONE DETECTED NONE DETECTED   Tetrahydrocannabinol NONE DETECTED NONE DETECTED   Barbiturates NONE DETECTED NONE DETECTED    Comment: (NOTE) DRUG SCREEN FOR MEDICAL PURPOSES ONLY.  IF CONFIRMATION IS NEEDED FOR ANY PURPOSE, NOTIFY  LAB WITHIN 5 DAYS.  LOWEST DETECTABLE LIMITS FOR URINE DRUG SCREEN Drug Class                     Cutoff (ng/mL) Amphetamine and metabolites    1000 Barbiturate and metabolites    200 Benzodiazepine                 200 Tricyclics and metabolites     300 Opiates and metabolites        300 Cocaine and metabolites  300 THC                            50 Performed at [facility], [address] [place], [place] [address]   Troponin I (High Sensitivity)     Status: Abnormal   Collection Time: 01/24/21  5:30 AM  Result Value Ref Range   Troponin I (High Sensitivity) 534 (HH) <18 ng/L    Comment: CRITICAL VALUE NOTED.  VALUE IS CONSISTENT WITH PREVIOUSLY REPORTED AND CALLED VALUE. (NOTE) Elevated high sensitivity troponin I (hsTnI) values and significant  changes across serial measurements may suggest ACS but many other  chronic and acute conditions are known to elevate hsTnI results.  Refer to the Links section for chest pain algorithms and additional  guidance. Performed at [facility], [address], [place], [place] [address]   CBG monitoring, ED     Status: Abnormal   Collection Time: 01/24/21  8:08 AM  Result Value Ref Range   Glucose-Capillary 199 (H) 70 - 99 mg/dL    Comment: Glucose reference range applies only to samples taken after fasting for at least 8 hours.  Type and screen [facility]     Status: None   Collection Time: 01/24/21 10:56 AM  Result Value Ref Range   ABO/RH(D) O POS    Antibody Screen NEG    Sample Expiration      01/27/2021,2359 Performed at [facility], [address] [place], [place] [address]   CBG monitoring, ED     Status: Abnormal   Collection Time: 01/24/21 12:47 PM  Result Value Ref Range   Glucose-Capillary 195 (H) 70 - 99 mg/dL    Comment: Glucose reference range applies only to samples taken after fasting for at least 8 hours.    Current Facility-Administered Medications  Medication  Dose Route Frequency Provider Last Rate Last Admin   acetaminophen (TYLENOL) tablet 650 mg  650 mg Oral Q6H PRN Doutova, Anastassia, MD       Or   acetaminophen (TYLENOL) suppository 650 mg  650 mg Rectal Q6H PRN Doutova, Anastassia, MD       aspirin EC tablet 81 mg  81 mg Oral Daily Doutova, Anastassia, MD   81 mg at 01/24/21 1250   heparin injection 5,000 Units  5,000 Units Subcutaneous Q8H Leroy Sea, MD       HYDROcodone-acetaminophen (NORCO/VICODIN) 5-325 MG per tablet 1-2 tablet  1-2 tablet Oral Q4H PRN Therisa Doyne, MD   1 tablet at 01/24/21 1457   insulin aspart (novoLOG) injection 0-9 Units  0-9 Units Subcutaneous Q4H Doutova, Anastassia, MD   2 Units at 01/24/21 1249   insulin glargine (LANTUS) injection 20 Units  20 Units Subcutaneous BID Leroy Sea, MD   20 Units at 01/24/21 1122   linezolid (ZYVOX) IVPB 600 mg  600 mg Intravenous Q12H Leroy Sea, MD   Stopped at 01/24/21 1119   loperamide (IMODIUM) capsule 2 mg  2 mg Oral Q6H PRN Leroy Sea, MD   2 mg at 01/24/21 1250   metoprolol tartrate (LOPRESSOR) tablet 12.5 mg  12.5 mg Oral BID Leroy Sea, MD   12.5 mg at 01/24/21 1253   pantoprazole (PROTONIX) EC tablet 40 mg  40 mg Oral Daily Leroy Sea, MD   40 mg at 01/24/21 1251   piperacillin-tazobactam (ZOSYN) IVPB 3.375 g  3.375 g Intravenous Q8H Doutova, Anastassia,  MD   Stopped at 01/24/21 1232   remdesivir 100 mg in sodium chloride 0.9 % 100 mL IVPB  100 mg Intravenous Daily Therisa Doyne, MD   Stopped at 01/24/21 1329   rosuvastatin (CRESTOR) tablet 20 mg  20 mg Oral Daily Doutova, Anastassia, MD   20 mg at 01/24/21 1251   traZODone (DESYREL) tablet 50 mg  50 mg Oral QHS PRN Bobette Mo, MD   50 mg at 01/24/21 0446    Musculoskeletal: Strength & Muscle Tone: within normal limits Gait & Station:  remains in bed on exam Patient leans: N/A            Psychiatric Specialty Exam:  Presentation  General  Appearance: Appropriate for Environment  Eye Contact:Fair  Speech:Clear and Coherent  Speech Volume:Normal  Handedness: No data recorded  Mood and Affect  Mood:Euthymic  Affect:Appropriate   Thought Process  Thought Processes:Coherent  Descriptions of Associations:Intact  Orientation:Full (Time, Place and Person)  Thought Content:Logical  History of Schizophrenia/Schizoaffective disorder:No data recorded Duration of Psychotic Symptoms:No data recorded Hallucinations:Hallucinations: None  Ideas of Reference:None  Suicidal Thoughts:Suicidal Thoughts: No  Homicidal Thoughts:Homicidal Thoughts: No   Sensorium  Memory:Immediate Good; Recent Good; Remote Good  Judgment:Intact  Insight:Shallow   Executive Functions  Concentration:Fair  Attention Span:Good  Recall:Fair  Fund of Knowledge:Fair  Language:Good   Psychomotor Activity  Psychomotor Activity:Psychomotor Activity: Normal   Assets  Assets:Communication Skills; Desire for Improvement; Resilience; Social Support; Financial Resources/Insurance   Sleep  Sleep:Sleep: Fair   Physical Exam: Physical Exam Constitutional:      Appearance: He is obese.  HENT:     Head: Normocephalic and atraumatic.  Eyes:     Extraocular Movements: Extraocular movements intact.     Conjunctiva/sclera: Conjunctivae normal.  Cardiovascular:     Rate and Rhythm: Normal rate.  Pulmonary:     Effort: Pulmonary effort is normal.     Breath sounds: Normal breath sounds.  Abdominal:     General: Abdomen is flat.  Musculoskeletal:        General: Normal range of motion.  Skin:    General: Skin is warm and dry.     Comments: R foot is swollen, ischemic appearing with skin desquamating  Neurological:     General: No focal deficit present.     Mental Status: He is alert.   Review of Systems  Constitutional:  Negative for chills and fever.  HENT:  Negative for hearing loss.   Eyes:  Negative for blurred  vision.  Respiratory:  Positive for cough. Negative for wheezing.   Cardiovascular:  Negative for chest pain.  Gastrointestinal:  Negative for abdominal pain.  Neurological:  Negative for dizziness.  Psychiatric/Behavioral:  Negative for depression, hallucinations and suicidal ideas.   Blood pressure 116/71, pulse (!) 116, temperature 100.3 F (37.9 C), temperature source Oral, resp. rate 20, height 6' (1.829 m), weight 133.1 kg, SpO2 96 %. Body mass index is 39.8 kg/m.  Treatment Plan Summary: Per primary team Capacity Hx of Bipolar disorder vs schizophrenia At this time patient appears to have capacity to make his own medical decisions. Patient endorses having a good understanding of his dx and the treatment indicated. Patient understands the consequences of not getting the recommended treatment and patient is able to reason why he does not wish to agree at this time with the recommendation.  At this time cannot restart patient's maintenance therapy due to his recent NSTEMI and concern for QT prolongation. Patient should not be on  an antidepressant alone due to prior dx and tx of Bipolar disorder as this can increase risk for manic episode. - Recommend consult to [facility] to find OP psych for patient   Patient is determined to be psychiatrically stable at this time. Psychiatry will sign off. Please do not hesitate to call back if questions arise. Thank you for this consult.   Disposition: No evidence of imminent risk to self or others at present.   PGY-1 Bobbye Morton, MD 01/24/2021 3:27 PM

## 2021-01-24 NOTE — Progress Notes (Signed)
Patient states that he does NOT consent for surgery tomorrow. Patient repeatedly states to not get him for surgery in the morning. Patient signed consent form, but states that he was medicated at that time. Patient states the he and his wife need to speak to the doctor in the morning. This RN called Ophelia Charter, MD to notify him of this. Ophelia Charter, MD states that doctor Lajoyce Corners will make rounds tomorrow, and will have to speak to patient at that time. Esther, RN notified and this will be passed on in daytime shift report.
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
## 2021-01-24 NOTE — ED Notes (Signed)
Breakfast order placed ?

## 2021-01-24 NOTE — ED Notes (Signed)
Pt not tolerating PO K at all. Attempted to administer medication with apple sauce but pt became nauseated and vomited

## 2021-01-24 NOTE — Progress Notes (Signed)
Inpatient Diabetes Program Recommendations  AACE/ADA: New Consensus Statement on Inpatient Glycemic Control (2015)  Target Ranges:  Prepandial:   less than 140 mg/dL      Peak postprandial:   less than 180 mg/dL (1-2 hours)      Critically ill patients:  140 - 180 mg/dL   Results for Mitchell [name], Mitchell [name] (MRN [id number]) as of 01/24/2021 11:11  Ref. Range 01/23/2021 20:46 01/24/2021 01:18 01/24/2021 03:36 01/24/2021 08:08  Glucose-Capillary Latest Ref Range: 70 - 99 mg/dL 594 (H)  7 units NOVOLOG  60 units LANTUS @22 :35 292 (H)  5 units NOVOLOG  236 (H)  3 units NOVOLOG  199 (H)  2 units NOVOLOG  20 units LANTUS @11 :22     Admit Sepsis, Foot cellulitis and COVID infection  History: DM, CHF, Schizophrenia  Home DM Meds: Basaglar 80 units QAM   Current Orders: Lantus 20 units BID    Novolog 0-9 units Q4 hours    Both Insulins started last PM--Pt got 60 units Lantus last PM--Will start the 20 BID today  Current A1c pending  Will follow    --Will follow patient during hospitalization--  RN, MSN, CDE Diabetes Coordinator Inpatient Glycemic Control Team Team Pager: [phone] (8a-5p)

## 2021-01-24 NOTE — Progress Notes (Signed)
[facility] night shift PCU coverage note.  The patient requested something to help him sleep.  Trazodone 50 mg p.o. nightly as needed was ordered.  Sanda Klein, MD

## 2021-01-24 NOTE — Progress Notes (Signed)
ANTICOAGULATION CONSULT NOTE - Follow Up Consult  Pharmacy Consult for heparin Indication: chest pain/ACS  Labs: Recent Labs    01/23/21 1729 01/23/21 1737 01/23/21 1755 01/23/21 2136 01/24/21 0230  HGB 12.2* 13.6  13.3  --   --  11.4*  HCT 39.0 40.0  39.0  --   --  35.8*  PLT 213  --   --   --  218  APTT  --   --  34  --   --   LABPROT  --   --  15.5*  --   --   INR  --   --  1.2  --   --   HEPARINUNFRC  --   --   --   --  <0.10*  CREATININE 2.41* 2.30*  --   --  3.06*  CKTOTAL  --   --   --  431*  --   TROPONINIHS 399*  --   --  489* 493*    Assessment: 50yo male subtherapeutic on heparin with initial dosing for CP; no gtt issues or signs of bleeding per RN.  Goal of Therapy:  Heparin level 0.3-0.7 units/ml   Plan:  Will rebolus with heparin 4000 units and increase heparin gtt by 4 units/kg/hr to 1800 units/hr and check level in 8 hours.    Vernard Gambles, PharmD, BCPS  01/24/2021,4:25 AM
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
## 2021-01-24 NOTE — Progress Notes (Signed)
PT Cancellation Note  Patient Details Name: Mitchell [name] MRN: [id number] DOB: October 10, 1970   Cancelled Treatment:    Reason Eval/Treat Not Completed: Medical issues which prohibited therapy (RN asked to hold, may be going to sx)  Lyanne Co, PT  Acute Rehab Services  Pager [phone] Office [phone]    [name] [name] 01/24/2021, 12:11 PM
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
## 2021-01-24 NOTE — Progress Notes (Signed)
Patient stated he does not have a cpap at home.

## 2021-01-24 NOTE — ED Notes (Signed)
ECHO at bedside at this time.  

## 2021-01-24 NOTE — ED Notes (Signed)
Pt speaking to his daughter on the phone at this time.

## 2021-01-24 NOTE — Consult Note (Addendum)
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 discontinuation           of antibiotics    ----------------------------     -----------------------------         PCT >= 0.50 ng/mL              PCT 0.26 - 0.50 ng/mL               AND       <80% decrease in PCT  Encourage initiation of                                             antibiotics       Encourage continuation           of antibiotics     ----------------------------     -----------------------------        PCT >= 0.50 ng/mL                  PCT > 0.50 ng/mL               AND         increase in PCT                  Strongly encourage                                      initiation of antibiotics    Strongly encourage escalation           of antibiotics                                     -----------------------------                                           PCT <= 0.25 ng/mL                                                 OR                                        > 80% decrease in PCT                                      Discontinue / Do not initiate                                             antibiotics  Performed at [facility], [address] [place], [place] [address]   D-dimer, quantitative     Status: Abnormal   Collection Time: 01/23/21  9:45 PM  Result Value Ref Range   D-Dimer, Quant 2.80 (H) 0.00 - 0.50 ug/mL-FEU    Comment: (NOTE) At the manufacturer cut-off value of 0.5 g/mL FEU, this assay has a negative predictive value of 95-100%.This assay is intended for use in conjunction with a clinical pretest probability (PTP) assessment model to exclude pulmonary embolism (PE) and deep venous thrombosis (DVT) in outpatients suspected of PE or DVT. Results should be correlated with clinical presentation. Performed at [facility], [place] [address] [place], [place] [address]  Fibrinogen     Status: Abnormal   Collection Time: 01/23/21  9:45 PM  Result Value Ref Range   Fibrinogen >800 (H) 210 - 475 mg/dL    Comment: REPEATED TO VERIFY Performed at [facility], [place] [address], [place], [place] [address]   Lactic acid, plasma     Status: Abnormal   Collection Time: 01/23/21 10:45 PM  Result Value Ref Range   Lactic Acid, Venous 2.2 (HH) 0.5 - 1.9 mmol/L    Comment: CRITICAL RESULT CALLED TO, READ BACK BY AND VERIFIED WITH: TATE G,RN 01/23/21 2319 WAYK Performed at [facility], [place] [address] [place], [place] [address]   CBG monitoring, ED     Status: Abnormal   Collection Time: 01/24/21  1:18 AM  Result Value Ref Range   Glucose-Capillary 292 (H) 70 - 99 mg/dL    Comment: Glucose reference range applies only to samples taken after fasting for at least 8 hours.  CBC with Differential/Platelet     Status: Abnormal   Collection Time: 01/24/21  2:30 AM  Result Value Ref Range   WBC 22.1 (H) 4.0 - 10.5 K/uL   RBC 3.80 (L) 4.22 - 5.81 MIL/uL   Hemoglobin 11.4 (L) 13.0 - 17.0 g/dL   HCT 35.8 (L) 39.0 - 52.0 %   MCV 94.2 80.0 - 100.0 fL   MCH 30.0 26.0 - 34.0 pg   MCHC 31.8 30.0 - 36.0 g/dL   RDW 12.9 11.5 - 15.5 %   Platelets 218 150 - 400 K/uL   nRBC 0.0 0.0 - 0.2 %   Neutrophils Relative % 84 %   Neutro Abs 18.5 (H) 1.7 - 7.7 K/uL   Lymphocytes Relative 8 %   Lymphs Abs 1.7 0.7 - 4.0 K/uL   Monocytes Relative 7 %   Monocytes Absolute 1.6 (H) 0.1 - 1.0 K/uL   Eosinophils Relative 0 %   Eosinophils Absolute 0.0 0.0 - 0.5 K/uL   Basophils Relative 0 %   Basophils Absolute 0.0 0.0 - 0.1 K/uL   Immature Granulocytes 1 %   Abs Immature Granulocytes 0.24 (H) 0.00 - 0.07 K/uL    Comment: Performed at [facility], [address], [place], [place] [address]  Comprehensive metabolic panel     Status: Abnormal   Collection Time: 01/24/21  2:30 AM  Result Value Ref Range   Sodium 135 135 - 145 mmol/L   Potassium 3.5 3.5 - 5.1 mmol/L   Chloride 99 98 - 111 mmol/L   CO2 26 22 - 32 mmol/L   Glucose, Bld 256 (H) 70 - 99 mg/dL    Comment: Glucose reference range applies only to samples taken after fasting for at least 8 hours.   BUN 35 (H) 6 - 20 mg/dL   Creatinine, Ser 3.06 (H) 0.61 - 1.24 mg/dL   Calcium 7.7 (L) 8.9 - 10.3 mg/dL   Total Protein 6.6 6.5 - 8.1 g/dL   Albumin 2.4 (L) 3.5 - 5.0 g/dL   AST 30 15 - 41 U/L   ALT 14 0 - 44 U/L   Alkaline Phosphatase 75 38 - 126 U/L   Total Bilirubin 0.8 0.3 - 1.2 mg/dL   GFR, Estimated 24 (L) >60  mL/min    Comment: (NOTE) Calculated using the CKD-EPI Creatinine Equation (2021)    Anion gap 10 5 - 15    Comment: Performed at [facility], [place] [address] [place], [place] [address]  C-reactive  protein     Status: Abnormal   Collection Time: 01/24/21  2:30 AM  Result Value Ref Range   CRP 29.2 (H) <1.0 mg/dL    Comment: Performed at [facility], [place] [address] [place], [place] [address]  D-dimer, quantitative     Status: Abnormal   Collection Time: 01/24/21  2:30 AM  Result Value Ref Range   D-Dimer, Quant 3.07 (H) 0.00 - 0.50 ug/mL-FEU    Comment: (NOTE) At the manufacturer cut-off value of 0.5 g/mL FEU, this assay has a negative predictive value of 95-100%.This assay is intended for use in conjunction with a clinical pretest probability (PTP) assessment model to exclude pulmonary embolism (PE) and deep venous thrombosis (DVT) in outpatients suspected of PE or DVT. Results should be correlated with clinical presentation. Performed at [facility], [place] [address], [place], [place] [address]   Magnesium     Status: None   Collection Time: 01/24/21  2:30 AM  Result Value Ref Range   Magnesium 1.9 1.7 - 2.4 mg/dL    Comment: Performed at [facility] [address] [place], [place] [address]  Phosphorus     Status: Abnormal   Collection Time: 01/24/21  2:30 AM  Result Value Ref Range   Phosphorus 2.2 (L) 2.5 - 4.6 mg/dL    Comment: Performed at [facility] [address] [place], [place] [address]  TSH     Status: None   Collection Time: 01/24/21  2:30 AM  Result Value Ref Range   TSH 1.123 0.350 - 4.500 uIU/mL    Comment: Performed by a 3rd Generation assay with a functional sensitivity of <=0.01 uIU/mL. Performed at [facility], [place] [address], [place], [place] [address]   Heparin level (unfractionated)     Status: Abnormal   Collection Time: 01/24/21  2:30 AM  Result Value Ref Range   Heparin Unfractionated <0.10 (L) 0.30  - 0.70 IU/mL    Comment: (NOTE) The clinical reportable range upper limit is being lowered to >1.10 to align with the FDA approved guidance for the current laboratory assay.  If heparin results are below expected values, and patient dosage has  been confirmed, suggest follow up testing of antithrombin III levels. Performed at [facility], [place] [address], [place], [place] [address]   Troponin I (High Sensitivity)     Status: Abnormal   Collection Time: 01/24/21  2:30 AM  Result Value Ref Range   Troponin I (High Sensitivity) 493 (HH) <18 ng/L    Comment: CRITICAL VALUE NOTED.  VALUE IS CONSISTENT WITH PREVIOUSLY REPORTED AND CALLED VALUE. (NOTE) Elevated high sensitivity troponin I (hsTnI) values and significant  changes across serial measurements may suggest ACS but many other  chronic and acute conditions are known to elevate hsTnI results.  Refer to the Links section for chest pain algorithms and additional  guidance. Performed at [facility], [place] [address] [place], [place] [address]   Brain natriuretic peptide     Status: Abnormal   Collection Time: 01/24/21  2:30 AM  Result Value Ref Range   B Natriuretic Peptide 522.3 (H) 0.0 - 100.0 pg/mL    Comment: Performed at [facility] [address] [place], [place] [address]  CBG monitoring, ED     Status: Abnormal   Collection Time: 01/24/21  3:36 AM  Result Value Ref Range   Glucose-Capillary 236 (H) 70 - 99 mg/dL  Comment: Glucose reference range applies only to samples taken after fasting for at least 8 hours.  Urinalysis, Routine w reflex microscopic     Status: Abnormal   Collection Time: 01/24/21  3:39 AM  Result Value Ref Range   Color, Urine AMBER (A) YELLOW    Comment: BIOCHEMICALS MAY BE AFFECTED BY COLOR   APPearance TURBID (A) CLEAR   Specific Gravity, Urine 1.023 1.005 - 1.030   pH 5.0 5.0 - 8.0   Glucose, UA 50 (A) NEGATIVE mg/dL   Hgb urine dipstick NEGATIVE NEGATIVE   Bilirubin  Urine NEGATIVE NEGATIVE   Ketones, ur NEGATIVE NEGATIVE mg/dL   Protein, ur 100 (A) NEGATIVE mg/dL   Nitrite NEGATIVE NEGATIVE   Leukocytes,Ua NEGATIVE NEGATIVE   RBC / HPF 0-5 0 - 5 RBC/hpf   Bacteria, UA MANY (A) NONE SEEN   Squamous Epithelial / LPF 0-5 0 - 5   Mucus PRESENT    Amorphous Crystal PRESENT     Comment: Performed at [facility], [place] [address], [place], [place] [address]  Creatinine, urine, random     Status: None   Collection Time: 01/24/21  3:39 AM  Result Value Ref Range   Creatinine, Urine 273.30 mg/dL    Comment: Performed at [facility] [address] [place], [place] [address]  Sodium, urine, random     Status: None   Collection Time: 01/24/21  3:39 AM  Result Value Ref Range   Sodium, Ur 20 mmol/L    Comment: Performed at [facility] [address], [place], [place] [address]  Urine rapid drug screen (hosp performed)     Status: None   Collection Time: 01/24/21  3:39 AM  Result Value Ref Range   Opiates NONE DETECTED NONE DETECTED   Cocaine NONE DETECTED NONE DETECTED   Benzodiazepines NONE DETECTED NONE DETECTED   Amphetamines NONE DETECTED NONE DETECTED   Tetrahydrocannabinol NONE DETECTED NONE DETECTED   Barbiturates NONE DETECTED NONE DETECTED    Comment: (NOTE) DRUG SCREEN FOR MEDICAL PURPOSES ONLY.  IF CONFIRMATION IS NEEDED FOR ANY PURPOSE, NOTIFY LAB WITHIN 5 DAYS.  LOWEST DETECTABLE LIMITS FOR URINE DRUG SCREEN Drug Class                     Cutoff (ng/mL) Amphetamine and metabolites    1000 Barbiturate and metabolites    200 Benzodiazepine                 263 Tricyclics and metabolites     300 Opiates and metabolites        300 Cocaine and metabolites        300 THC                            50 Performed at [facility], [place] [address] [place], [place] [address]   Troponin I (High Sensitivity)     Status: Abnormal   Collection Time: 01/24/21  5:30 AM  Result Value Ref Range   Troponin I (High  Sensitivity) 534 (HH) <18 ng/L    Comment: CRITICAL VALUE NOTED.  VALUE IS CONSISTENT WITH PREVIOUSLY REPORTED AND CALLED VALUE. (NOTE) Elevated high sensitivity troponin I (hsTnI) values and significant  changes across serial measurements may suggest ACS but many other  chronic and acute conditions are known to elevate hsTnI results.  Refer to the Links section for chest pain algorithms and additional  guidance. Performed at [facility], [place] [address] [place], [place] [address]   CBG monitoring, ED     Status: Abnormal   Collection Time: 01/24/21  8:08 AM  Result Value Ref Range   Glucose-Capillary 199 (H) 70 - 99 mg/dL    Comment: Glucose reference range applies only to samples taken after fasting for at least 8 hours.  Type and screen [place]     Status: None   Collection Time: 01/24/21 10:56 AM  Result Value Ref Range   ABO/RH(D) O POS    Antibody Screen NEG    Sample Expiration      01/27/2021,2359 Performed at [facility], [place] [address], [place], [place] [address]   CBG monitoring, ED     Status: Abnormal   Collection Time: 01/24/21 12:47 PM  Result Value Ref Range   Glucose-Capillary 195 (H) 70 - 99 mg/dL    Comment: Glucose reference range applies only to samples taken after fasting for at least 8 hours.     ROS:  Pertinent items noted in HPI and remainder of comprehensive ROS otherwise negative.  Physical Exam: Vitals:   01/24/21 1000 01/24/21 1253  BP: 128/82 129/83  Pulse: 96 (!) 120  Resp: 12   Temp: 100.1 F (37.8 C)   SpO2: 96%      General exam: Not in distress, lying on bed Respiratory system: Clear to auscultation. Respiratory effort normal. No wheezing or crackle Cardiovascular system: S1 & S2 heard, RRR.  No pedal edema. Gastrointestinal system: Abdomen is  soft and nontender. Normal bowel sounds heard. Central nervous system: Alert and oriented. No focal neurological deficits. Extremities: Right foot has  blisters and gangrenous changes, left foot has a skin bleeding. Skin: Leg lesion and blisters on right foot as above.  No other rash. Psychiatry: Alert awake and oriented.  Assessment/Plan:  #Acute kidney injury likely ischemic ATN with multifactorial etiology including sepsis/hypotension/COVID infection/Entresto use /NSTEMI concomitant with chronic systolic CHF causing poor renal perfusion.  UA without microscopic hematuria however has some protein.  Kidney ultrasound ruled out obstruction.  I agree with holding Entresto and diuretics with gentle IV hydration.  Continue with strict ins and outs and daily labs.  Hopefully the renal function will start improving.  Please dose all medication for GFR less than 10 and monitor vancomycin level closely.  Given his comorbidities including poor cardiac function, severe three-vessel disease not amenable for any intervention, stroke, now foot infection which will require amputation makes him a poor candidate for outpatient dialysis.  I have informed this to the patient.  #Severe sepsis due to right foot infection/abscess/wet gangrene: Currently on broad-spectrum antibiotics.  Seen by orthopedics recommending amputation.  Initially he was refusing surgery but it seems like now he is amenable for the procedure.  #CAD with ischemic cardiomyopathy and an NSTEMI: Apparently he has known significant CAD and not a candidate for PCI or CABG with poor targets.  Cardiac medications per cardiology.  Currently chest pain-free.   # Chronic systolic CHF with EF of 20: Holding diuretics and Entresto because of AKI.  On gentle hydration.  #COVID-positive on remdesivir per primary team.  Thank you for the consult.  We will follow with you.  Mitchell [name] Tanna Furry 01/24/2021, 2:21 PM  [facility].

## 2021-01-24 NOTE — Progress Notes (Signed)
  Echocardiogram 2D Echocardiogram has been performed.  Janalyn Harder 01/24/2021, 10:17 AM

## 2021-01-24 NOTE — Consult Note (Signed)
NAME:  Mitchell [name], MRN:  [id number], DOB:  1970-09-23, LOS: 1 ADMISSION DATE:  01/23/2021, CONSULTATION DATE:  01/24/21 REFERRING MD:  Dr Candiss Norse, CHIEF COMPLAINT:  ischemic R foot   History of Present Illness:  50 yo male with pmh ICM with LVE 20-25%, cva, osa, pulm htn, dm2 uncontrolled who presented with R ischemic foot and subsequent sepsis. He was initially offered amputation and declined until he could speak with family and has represented yesterday for such procedure. CCM was called for potential decompensation prior to or after surgery.   Pt also noted to have elevated trop, volume overload in acute decompensated hf, acute renal failure. Also has been diagnosed with covid last week per pt (has positive from 08/27/20 as well)  Pertinent  Medical History  Icm HFrEF (20-25%) Bipolar Cva Dm2 Osa Htn hyperlipidemia  Significant Hospital Events: Including procedures, antibiotic start and stop dates in addition to other pertinent events   Linezolid, zosyn, vanc Remdesivir Blood cx 6/26: ngtd  Interim History / Subjective:  As above  Objective   Blood pressure 116/71, pulse (!) 116, temperature 100.3 F (37.9 C), temperature source Oral, resp. rate 20, height 6' (1.829 m), weight 133.1 kg, SpO2 96 %.        Intake/Output Summary (Last 24 hours) at 01/24/2021 1603 Last data filed at 01/23/2021 2202 Gross per 24 hour  Intake 750 ml  Output --  Net 750 ml   Filed Weights   01/23/21 2200 01/24/21 1423  Weight: (!) 137 kg 133.1 kg    Examination: General: sitting up on edge of bed, nauseous, alert conversant HENT: ncat, eomi, mmmp Lungs: wheeze bilateral with rales Cardiovascular: distant Abdomen: obese, taut skin  Extremities: edematous, blistering Rshin with necrotic sloughing skin and active drainage.  Neuro: aaox3 GU: deferred  Resolved Hospital Problem list   None   Assessment & Plan:  Sepsis 2/2 ischemic R foot:  -cont abx -f/u cx -consider dose of  clinda +/- id consult -ortho for amputation  Nausea:  -noted qt interval and unfortunately no good option but pt actively heaving so will provide some zofran and magnesium  Arf:  -recheck cpk -nephrology following.  -not ideal outpt dialysis candidate  Icm with acute decompensated HFrEF:  -per primary Dm2 uncontrolled:  -per primary  We will follow peripherally at this time, should he req ICU monitoring we are readily available.   Best Practice (right click and "Reselect all SmartList Selections" daily)   Diet/type: NPO DVT prophylaxis: other GI prophylaxis: N/A Lines: N/A Foley:  N/A Code Status:  full code Last date of multidisciplinary goals of care discussion [per primary]  Labs   CBC: Recent Labs  Lab 01/23/21 1729 01/23/21 1737 01/24/21 0230  WBC 22.0*  --  22.1*  NEUTROABS 19.2*  --  18.5*  HGB 12.2* 13.6  13.3 11.4*  HCT 39.0 40.0  39.0 35.8*  MCV 94.7  --  94.2  PLT 213  --  953    Basic Metabolic Panel: Recent Labs  Lab 01/23/21 1729 01/23/21 1737 01/23/21 2136 01/24/21 0230  NA 135 136  136  --  135  K 3.8 3.8  3.8  --  3.5  CL 99 98  --  99  CO2 24  --   --  26  GLUCOSE 355* 366*  --  256*  BUN 30* 31*  --  35*  CREATININE 2.41* 2.30*  --  3.06*  CALCIUM 8.1*  --   --  7.7*  MG  --   --  1.8 1.9  PHOS  --   --  2.3* 2.2*   GFR: Estimated Creatinine Clearance: 39.9 mL/min (A) (by C-G formula based on SCr of 3.06 mg/dL (H)). Recent Labs  Lab 01/23/21 1729 01/23/21 1940 01/23/21 2009 01/23/21 2108 01/23/21 2136 01/23/21 2245 01/24/21 0230  PROCALCITON  --  2.44  --   --  2.65  --  2.98  WBC 22.0*  --   --   --   --   --  22.1*  LATICACIDVEN 2.0*  --  1.9 1.8  --  2.2*  --     Liver Function Tests: Recent Labs  Lab 01/23/21 1729 01/24/21 0230  AST 27 30  ALT 15 14  ALKPHOS 91 75  BILITOT 1.2 0.8  PROT 7.3 6.6  ALBUMIN 2.7* 2.4*   No results for input(s): LIPASE, AMYLASE in the last 168 hours. Recent Labs  Lab  01/23/21 2108  AMMONIA 11    ABG    Component Value Date/Time   PHART 7.372 09/26/2017 1159   PCO2ART 45.6 09/26/2017 1159   PO2ART 63.0 (L) 09/26/2017 1159   HCO3 28.6 (H) 01/23/2021 1737   TCO2 27 01/23/2021 1737   TCO2 30 01/23/2021 1737   ACIDBASEDEF 3.0 (H) 05/29/2007 1906   O2SAT 86.0 01/23/2021 1737     Coagulation Profile: Recent Labs  Lab 01/23/21 1755  INR 1.2    Cardiac Enzymes: Recent Labs  Lab 01/23/21 2136  CKTOTAL 431*    HbA1C: Hemoglobin A1C  Date/Time Value Ref Range Status  03/30/2020 02:26 PM 7.9 (A) 4.0 - 5.6 % Final   Hgb A1c MFr Bld  Date/Time Value Ref Range Status  08/04/2020 10:54 AM 8.1 (H) 4.6 - 6.5 % Final    Comment:    Glycemic Control Guidelines for People with Diabetes:Non Diabetic:  <6%Goal of Therapy: <7%Additional Action Suggested:  >8%   11/06/2019 12:09 PM 8.7 (H) 4.6 - 6.5 % Final    Comment:    Glycemic Control Guidelines for People with Diabetes:Non Diabetic:  <6%Goal of Therapy: <7%Additional Action Suggested:  >8%     CBG: Recent Labs  Lab 01/23/21 2046 01/24/21 0118 01/24/21 0336 01/24/21 0808 01/24/21 1247  GLUCAP 343* 292* 236* 199* 195*    Review of Systems:   Tired, no pain, weak, nausea with heaving. Sob, orthopnea  Past Medical History:  He,  has a past medical history of Bipolar disorder ([place]), CAD (coronary artery disease), Cataract, Chronic systolic CHF (congestive heart failure) ([place]), CVA (cerebral infarction), Depression, Diabetes mellitus, Headache(784.0), Herpes simplex of male genitalia, History of colonic polyps, Hyperlipidemia, Hypertension, Myocardial infarction ([place]) (1987), Obesity, OSA (obstructive sleep apnea), Pneumonia, Post-cardiac injury syndrome ([place]), Pulmonary hypertension ([place]), PVCs (premature ventricular contractions), Schizophrenia ([place]), Sleep apnea, Stroke ([place]) (2005), and Syncope.   Surgical History:   Past Surgical History:  Procedure Laterality Date   CARDIAC  CATHETERIZATION  12/19/10   DIFFUSE NONOBSTRUCTIVE CAD; NONISCHEMIC CARDIOMYOPATHY; LEFT VENTRICULAR ANGIOGRAM WAS PERFORMED SECONDARY TO  ELEVATED LEFT VENTRICULAR FILLING PRESSURES   COLONOSCOPY     ~ age [age]   COLONOSCOPY W/ POLYPECTOMY     I & D EXTREMITY Bilateral 10/24/2019   Procedure: IRRIGATION AND DEBRIDEMENT BILATERAL EXTREMITY WOUND ON FOOT;  Surgeon: Cindra Presume, MD;  Location: [place];  Service: Plastics;  Laterality: Bilateral;   METATARSAL HEAD EXCISION Right 08/29/2018   Procedure: METATARSAL HEAD RESECTION;  Surgeon: Edrick Kins, DPM;  Location: [facility];  Service:  Podiatry;  Laterality: Right;   METATARSAL OSTEOTOMY Right 08/29/2018   Procedure: SUB FIFTHE METATARSIA RIGHT FOOT;  Surgeon: Edrick Kins, DPM;  Location: [facility];  Service: Podiatry;  Laterality: Right;   MULTIPLE EXTRACTIONS WITH ALVEOLOPLASTY  01/27/2014   "all my teeth; 4 Quadrants of alveoloplasty   MULTIPLE EXTRACTIONS WITH ALVEOLOPLASTY N/A 01/27/2014   Procedure: EXTRACTION OF TEETH #'1, 2, 3, 4, 5, 6, 7, 8, 9, 10, 11, 12, 13, 14, 15, 16, 17, 20, 21, 22, 23, 24, 25, 26, 27, 28, 29, 31 and 32 WITH ALVEOLOPLASTY;  Surgeon: Lenn Cal, DDS;  Location: [facility];  Service: Oral Surgery;  Laterality: N/A;   ORIF FINGER / THUMB FRACTURE Right    POLYPECTOMY     RIGHT/LEFT HEART CATH AND CORONARY ANGIOGRAPHY N/A 09/26/2017   Procedure: RIGHT/LEFT HEART CATH AND CORONARY ANGIOGRAPHY;  Surgeon: Jolaine Artist, MD;  Location: [facility];  Service: Cardiovascular;  Laterality: N/A;   SKIN SPLIT GRAFT Bilateral 10/24/2019   Procedure: SKIN GRAFT SPLIT THICKNESS LEFT THIGH;  Surgeon: Cindra Presume, MD;  Location: [facility];  Service: Plastics;  Laterality: Bilateral;   WOUND DEBRIDEMENT Right 08/29/2018   Procedure: Debridement of ulcer on right fifth metatarsal;  Surgeon: Edrick Kins, DPM;  Location: [facility];  Service: Podiatry;  Laterality: Right;     Social History:   reports that he has never smoked.  He has never used smokeless tobacco. He reports that he does not drink alcohol and does not use drugs.   Family History:  His family history includes Cancer - Ovarian in his mother; Cardiomyopathy in his mother; Colon cancer in his paternal uncle and paternal uncle; Diabetes in his brother, father, mother, and sister; Heart disease in his father and mother; Heart failure in his mother; Hypertension in his father; Ovarian cancer in his mother. There is no history of Colon polyps, Esophageal cancer, Rectal cancer, or Stomach cancer.   Allergies Allergies  Allergen Reactions   Nsaids Anaphylaxis and Other (See Comments)    Able to tolerate aspirin      Home Medications  Prior to Admission medications   Medication Sig Start Date End Date Taking? Authorizing Provider  acetaminophen (TYLENOL) 325 MG tablet Take 2 tablets (650 mg total) by mouth every 4 (four) hours as needed for headache or mild pain. 11/03/19  Yes Domenic Polite, MD  aspirin EC 81 MG EC tablet Take 1 tablet (81 mg total) by mouth daily. 11/03/19  Yes Domenic Polite, MD  Aspirin-Acetaminophen-Caffeine (GOODY HEADACHE PO) Take 1 Package by mouth daily as needed (Headaches).   Yes [provider]  Blood Glucose Monitoring Suppl (ACCU-CHEK GUIDE ME) w/Device KIT 1 each by Does not apply route 2 (two) times daily. Use to monitor glucose levels BID 06/07/18  Yes Renato Shin, MD  carvedilol (COREG) 6.25 MG tablet TAKE 1 & 1/2 (ONE & ONE-HALF) TABLETS BY MOUTH TWICE DAILY WITH A MEAL Patient taking differently: Take 6.25 mg by mouth 2 (two) times daily with a meal. 05/27/20  Yes [name], [name], MD  glucose blood (ACCU-CHEK GUIDE) test strip 1 each by Other route in the morning and at bedtime. And lancets 2/day. 11/13/19  Yes Renato Shin, MD  Insulin Glargine [facility] KWIKPEN) 100 UNIT/ML Inject 0.8 mLs (80 Units total) into the skin every morning. 03/30/20  Yes Renato Shin, MD  Lancets Misc. [facility] FASTCLIX LANCET) KIT  Use to monitor glucose levels BID 06/07/18  Yes Renato Shin, MD  Pseudoeph-Doxylamine-DM-APAP (NYQUIL PO) Take 1 Dose by mouth at bedtime as needed (cold symptoms).   Yes [provider]  Pseudoephedrine-guaiFENesin (MUCINEX D PO) Take 1 tablet by mouth 2 (two) times daily as needed (cough).   Yes [provider]  sacubitril-valsartan (ENTRESTO) 24-26 MG Take 1 tablet by mouth 2 (two) times daily. 03/25/20  Yes [name], [name], MD  torsemide (DEMADEX) 20 MG tablet Take 1 tablet by mouth once daily Patient taking differently: Take 20 mg by mouth daily. 12/23/20  Yes [name], [name], MD  Lancets 28G MISC Use for glucose testing up to BID 11/02/16   [name], [name], MD  PEG-KCl-NaCl-NaSulf-Na Asc-C (PLENVU) 140 g SOLR Take 1 kit by mouth as directed. Manufacturer's coupon Universal coupon code:BIN: P2366821; GROUP: [id number]; PCN: [id number]; ID: [id number]; PAY NO MORE $50; NO prior authorization Patient not taking: Reported on 01/23/2021 08/20/20   Irene Shipper, MD  rosuvastatin (CRESTOR) 20 MG tablet Take 1 tablet (20 mg total) by mouth daily. Patient not taking: No sig reported 03/25/20   [name], [name], MD      The patient is critically ill with multiple organ systems failure and requires high complexity decision making for assessment and support, frequent evaluation and titration of therapies, application of advanced monitoring technologies and extensive interpretation of multiple databases.  Critical care time 30 mins. This represents my time independent of the NP's/PA's/med student/residents time taking care of the pt. This is excluding procedures   [facility] 01/24/2021, 4:32 PM See Amion for pager If no response to pager, please call [phone] until 1900 After 1900 please call [facility] [phone]

## 2021-01-25 ENCOUNTER — Encounter (HOSPITAL_COMMUNITY)
Admission: EM | Disposition: A | Discharge status: Rehabilitation Facility | Payer: Self-pay | Source: Home / Self Care | Attending: Internal Medicine

## 2021-01-25 ENCOUNTER — Encounter (HOSPITAL_COMMUNITY): Payer: Self-pay | Admitting: Internal Medicine

## 2021-01-25 ENCOUNTER — Inpatient Hospital Stay (HOSPITAL_COMMUNITY): Payer: No Typology Code available for payment source | Admitting: Anesthesiology

## 2021-01-25 ENCOUNTER — Telehealth: Payer: Self-pay | Admitting: Internal Medicine

## 2021-01-25 ENCOUNTER — Telehealth: Payer: Self-pay | Admitting: *Deleted

## 2021-01-25 ENCOUNTER — Other Ambulatory Visit: Payer: Self-pay

## 2021-01-25 DIAGNOSIS — R6520 Severe sepsis without septic shock: Secondary | ICD-10-CM

## 2021-01-25 DIAGNOSIS — A419 Sepsis, unspecified organism: Secondary | ICD-10-CM

## 2021-01-25 DIAGNOSIS — N179 Acute kidney failure, unspecified: Secondary | ICD-10-CM

## 2021-01-25 DIAGNOSIS — G9341 Metabolic encephalopathy: Secondary | ICD-10-CM | POA: Diagnosis not present

## 2021-01-25 DIAGNOSIS — L02611 Cutaneous abscess of right foot: Secondary | ICD-10-CM

## 2021-01-25 HISTORY — PX: AMPUTATION: SHX166

## 2021-01-25 LAB — COMPREHENSIVE METABOLIC PANEL
ALT: 16 U/L (ref 0–44)
AST: 29 U/L (ref 15–41)
Albumin: 2.3 g/dL — ABNORMAL LOW (ref 3.5–5.0)
Alkaline Phosphatase: 71 U/L (ref 38–126)
Anion gap: 12 (ref 5–15)
BUN: 38 mg/dL — ABNORMAL HIGH (ref 6–20)
CO2: 24 mmol/L (ref 22–32)
Calcium: 8 mg/dL — ABNORMAL LOW (ref 8.9–10.3)
Chloride: 100 mmol/L (ref 98–111)
Creatinine, Ser: 4.21 mg/dL — ABNORMAL HIGH (ref 0.61–1.24)
GFR, Estimated: 16 mL/min — ABNORMAL LOW (ref >60)
Glucose, Bld: 85 mg/dL (ref 70–99)
Potassium: 3.2 mmol/L — ABNORMAL LOW (ref 3.5–5.1)
Sodium: 136 mmol/L (ref 135–145)
Total Bilirubin: 0.8 mg/dL (ref 0.3–1.2)
Total Protein: 6.7 g/dL (ref 6.5–8.1)

## 2021-01-25 LAB — CBC WITH DIFFERENTIAL/PLATELET
Abs Immature Granulocytes: 0.46 10*3/uL — ABNORMAL HIGH (ref 0.00–0.07)
Basophils Absolute: 0.1 10*3/uL (ref 0.0–0.1)
Basophils Relative: 0 %
Eosinophils Absolute: 0.1 10*3/uL (ref 0.0–0.5)
Eosinophils Relative: 0 %
HCT: 35.5 % — ABNORMAL LOW (ref 39.0–52.0)
Hemoglobin: 11.4 g/dL — ABNORMAL LOW (ref 13.0–17.0)
Immature Granulocytes: 2 %
Lymphocytes Relative: 8 %
Lymphs Abs: 1.6 10*3/uL (ref 0.7–4.0)
MCH: 29.8 pg (ref 26.0–34.0)
MCHC: 32.1 g/dL (ref 30.0–36.0)
MCV: 92.7 fL (ref 80.0–100.0)
Monocytes Absolute: 1.4 10*3/uL — ABNORMAL HIGH (ref 0.1–1.0)
Monocytes Relative: 7 %
Neutro Abs: 17.4 10*3/uL — ABNORMAL HIGH (ref 1.7–7.7)
Neutrophils Relative %: 83 %
Platelets: 205 10*3/uL (ref 150–400)
RBC: 3.83 MIL/uL — ABNORMAL LOW (ref 4.22–5.81)
RDW: 13.1 % (ref 11.5–15.5)
WBC: 21.1 10*3/uL — ABNORMAL HIGH (ref 4.0–10.5)
nRBC: 0 % (ref 0.0–0.2)

## 2021-01-25 LAB — URINE CULTURE: Culture: NO GROWTH

## 2021-01-25 LAB — BLOOD CULTURE ID PANEL (REFLEXED) - BCID2

## 2021-01-25 LAB — GLUCOSE, CAPILLARY
Glucose-Capillary: 128 mg/dL — ABNORMAL HIGH (ref 70–99)
Glucose-Capillary: 156 mg/dL — ABNORMAL HIGH (ref 70–99)
Glucose-Capillary: 215 mg/dL — ABNORMAL HIGH (ref 70–99)
Glucose-Capillary: 259 mg/dL — ABNORMAL HIGH (ref 70–99)
Glucose-Capillary: 84 mg/dL (ref 70–99)
Glucose-Capillary: 95 mg/dL (ref 70–99)
Glucose-Capillary: 96 mg/dL (ref 70–99)

## 2021-01-25 LAB — BRAIN NATRIURETIC PEPTIDE: B Natriuretic Peptide: 404.3 pg/mL — ABNORMAL HIGH (ref 0.0–100.0)

## 2021-01-25 LAB — HEMOGLOBIN A1C
Hgb A1c MFr Bld: 8.1 % — ABNORMAL HIGH (ref 4.8–5.6)
Mean Plasma Glucose: 186 mg/dL

## 2021-01-25 LAB — PHOSPHORUS: Phosphorus: 3.4 mg/dL (ref 2.5–4.6)

## 2021-01-25 LAB — C-REACTIVE PROTEIN: CRP: 26.5 mg/dL — ABNORMAL HIGH (ref <1.0)

## 2021-01-25 LAB — PROCALCITONIN: Procalcitonin: 3.13 ng/mL

## 2021-01-25 LAB — CULTURE, BLOOD (ROUTINE X 2): Special Requests: ADEQUATE

## 2021-01-25 LAB — MAGNESIUM: Magnesium: 2.2 mg/dL (ref 1.7–2.4)

## 2021-01-25 SURGERY — AMPUTATION BELOW KNEE
Anesthesia: Choice | Site: Knee | Laterality: Right

## 2021-01-25 MED ORDER — FUROSEMIDE 10 MG/ML IJ SOLN
40.0000 mg | Freq: Two times a day (BID) | INTRAMUSCULAR | Status: DC
  Administered 2021-01-25: 40 mg via INTRAVENOUS
  Filled 2021-01-25: qty 4

## 2021-01-25 MED ORDER — ETOMIDATE 2 MG/ML IV SOLN
INTRAVENOUS | Status: DC | PRN
  Administered 2021-01-25: 14 mg via INTRAVENOUS

## 2021-01-25 MED ORDER — HEPARIN SODIUM (PORCINE) 5000 UNIT/ML IJ SOLN
5000.0000 [IU] | Freq: Three times a day (TID) | INTRAMUSCULAR | Status: DC
  Administered 2021-01-27: 5000 [IU] via SUBCUTANEOUS
  Filled 2021-01-25: qty 1

## 2021-01-25 MED ORDER — ASCORBIC ACID 500 MG PO TABS
1000.0000 mg | ORAL_TABLET | Freq: Every day | ORAL | Status: DC
  Administered 2021-01-25 – 2021-01-27 (×3): 1000 mg via ORAL
  Filled 2021-01-25 (×3): qty 2

## 2021-01-25 MED ORDER — HYDROMORPHONE HCL 1 MG/ML IJ SOLN
0.5000 mg | INTRAMUSCULAR | Status: DC | PRN
  Administered 2021-01-25 – 2021-01-31 (×7): 0.5 mg via INTRAVENOUS
  Filled 2021-01-25 (×9): qty 0.5

## 2021-01-25 MED ORDER — OXYCODONE HCL 5 MG PO TABS
10.0000 mg | ORAL_TABLET | ORAL | Status: DC | PRN
  Administered 2021-01-25: 10 mg via ORAL
  Administered 2021-01-26: 15 mg via ORAL
  Administered 2021-01-27 – 2021-01-28 (×2): 10 mg via ORAL
  Administered 2021-01-29 (×3): 15 mg via ORAL
  Administered 2021-01-31: 10 mg via ORAL
  Administered 2021-02-01: 15 mg via ORAL
  Administered 2021-02-01: 10 mg via ORAL
  Administered 2021-02-02 (×2): 15 mg via ORAL
  Filled 2021-01-25 (×2): qty 2
  Filled 2021-01-25: qty 3
  Filled 2021-01-25: qty 2
  Filled 2021-01-25 (×3): qty 3
  Filled 2021-01-25: qty 2
  Filled 2021-01-25: qty 3
  Filled 2021-01-25: qty 2
  Filled 2021-01-25: qty 3
  Filled 2021-01-25: qty 2
  Filled 2021-01-25: qty 3

## 2021-01-25 MED ORDER — JUVEN PO PACK
1.0000 | PACK | Freq: Two times a day (BID) | ORAL | Status: DC
  Administered 2021-01-26 – 2021-02-07 (×22): 1 via ORAL
  Filled 2021-01-25 (×21): qty 1

## 2021-01-25 MED ORDER — POTASSIUM CHLORIDE CRYS ER 20 MEQ PO TBCR
40.0000 meq | EXTENDED_RELEASE_TABLET | Freq: Once | ORAL | Status: DC
  Filled 2021-01-25: qty 2

## 2021-01-25 MED ORDER — SODIUM CHLORIDE 0.9 % IV SOLN: INTRAVENOUS | Status: DC

## 2021-01-25 MED ORDER — JUVEN PO PACK
1.0000 | PACK | Freq: Two times a day (BID) | ORAL | Status: DC
  Administered 2021-01-25 – 2021-01-26 (×2): 1 via ORAL
  Filled 2021-01-25 (×2): qty 1

## 2021-01-25 MED ORDER — DEXAMETHASONE SODIUM PHOSPHATE 10 MG/ML IJ SOLN
INTRAMUSCULAR | Status: DC | PRN
  Administered 2021-01-25: 5 mg via INTRAVENOUS

## 2021-01-25 MED ORDER — FENTANYL CITRATE (PF) 250 MCG/5ML IJ SOLN
INTRAMUSCULAR | Status: AC
  Filled 2021-01-25: qty 5

## 2021-01-25 MED ORDER — INSULIN GLARGINE 100 UNIT/ML ~~LOC~~ SOLN
20.0000 [IU] | Freq: Two times a day (BID) | SUBCUTANEOUS | Status: DC
  Administered 2021-01-26 – 2021-02-07 (×25): 20 [IU] via SUBCUTANEOUS
  Filled 2021-01-25 (×26): qty 0.2

## 2021-01-25 MED ORDER — ZINC SULFATE 220 (50 ZN) MG PO CAPS
220.0000 mg | ORAL_CAPSULE | Freq: Every day | ORAL | Status: AC
  Administered 2021-01-25 – 2021-02-07 (×14): 220 mg via ORAL
  Filled 2021-01-25 (×14): qty 1

## 2021-01-25 MED ORDER — ADULT MULTIVITAMIN W/MINERALS CH
1.0000 | ORAL_TABLET | Freq: Every day | ORAL | Status: DC
  Administered 2021-01-26 – 2021-02-07 (×13): 1 via ORAL
  Filled 2021-01-25 (×14): qty 1

## 2021-01-25 MED ORDER — ALUM & MAG HYDROXIDE-SIMETH 200-200-20 MG/5ML PO SUSP
15.0000 mL | ORAL | Status: DC | PRN
  Administered 2021-02-07: 30 mL via ORAL
  Filled 2021-01-25: qty 30

## 2021-01-25 MED ORDER — DEXTROSE 5 % IV SOLN: INTRAVENOUS | Status: AC

## 2021-01-25 MED ORDER — MIDAZOLAM HCL 2 MG/2ML IJ SOLN
INTRAMUSCULAR | Status: AC
  Filled 2021-01-25: qty 2

## 2021-01-25 MED ORDER — MIDAZOLAM HCL 2 MG/2ML IJ SOLN
INTRAMUSCULAR | Status: DC | PRN
  Administered 2021-01-25: 2 mg via INTRAVENOUS

## 2021-01-25 MED ORDER — POTASSIUM CHLORIDE CRYS ER 20 MEQ PO TBCR
20.0000 meq | EXTENDED_RELEASE_TABLET | Freq: Once | ORAL | Status: DC
  Filled 2021-01-25: qty 1

## 2021-01-25 MED ORDER — POTASSIUM CHLORIDE CRYS ER 20 MEQ PO TBCR: 20.0000 meq | EXTENDED_RELEASE_TABLET | Freq: Every day | ORAL | Status: DC | PRN

## 2021-01-25 MED ORDER — FENTANYL CITRATE (PF) 250 MCG/5ML IJ SOLN
INTRAMUSCULAR | Status: DC | PRN
  Administered 2021-01-25: 50 ug via INTRAVENOUS
  Administered 2021-01-25: 25 ug via INTRAVENOUS
  Administered 2021-01-25: 100 ug via INTRAVENOUS
  Administered 2021-01-25: 25 ug via INTRAVENOUS

## 2021-01-25 MED ORDER — GUAIFENESIN-DM 100-10 MG/5ML PO SYRP: 15.0000 mL | ORAL_SOLUTION | ORAL | Status: DC | PRN

## 2021-01-25 MED ORDER — PROPOFOL 10 MG/ML IV BOLUS
INTRAVENOUS | Status: AC
  Filled 2021-01-25: qty 40

## 2021-01-25 MED ORDER — ONDANSETRON HCL 4 MG/2ML IJ SOLN
INTRAMUSCULAR | Status: DC | PRN
  Administered 2021-01-25: 4 mg via INTRAVENOUS

## 2021-01-25 MED ORDER — LACTATED RINGERS IV SOLN: INTRAVENOUS | Status: DC | PRN

## 2021-01-25 MED ORDER — SUCCINYLCHOLINE CHLORIDE 200 MG/10ML IV SOSY
PREFILLED_SYRINGE | INTRAVENOUS | Status: DC | PRN
  Administered 2021-01-25: 140 mg via INTRAVENOUS

## 2021-01-25 MED ORDER — INSULIN GLARGINE 100 UNIT/ML ~~LOC~~ SOLN: 20.0000 [IU] | Freq: Two times a day (BID) | SUBCUTANEOUS | Status: DC

## 2021-01-25 MED ORDER — VASOPRESSIN 20 UNIT/ML IV SOLN
INTRAVENOUS | Status: AC
  Filled 2021-01-25: qty 1

## 2021-01-25 MED ORDER — ENSURE MAX PROTEIN PO LIQD
11.0000 [oz_av] | Freq: Every day | ORAL | Status: DC
  Administered 2021-01-26 – 2021-02-06 (×12): 11 [oz_av] via ORAL
  Filled 2021-01-25 (×13): qty 330

## 2021-01-25 MED ORDER — POTASSIUM CHLORIDE 20 MEQ PO PACK: 40.0000 meq | PACK | Freq: Once | ORAL | Status: DC

## 2021-01-25 MED ORDER — LIDOCAINE 2% (20 MG/ML) 5 ML SYRINGE
INTRAMUSCULAR | Status: DC | PRN
  Administered 2021-01-25: 100 mg via INTRAVENOUS

## 2021-01-25 MED ORDER — POTASSIUM CHLORIDE 20 MEQ PO PACK
20.0000 meq | PACK | Freq: Once | ORAL | Status: AC
  Administered 2021-01-25: 20 meq via ORAL
  Filled 2021-01-25: qty 1

## 2021-01-25 MED ORDER — PHENYLEPHRINE 40 MCG/ML (10ML) SYRINGE FOR IV PUSH (FOR BLOOD PRESSURE SUPPORT)
PREFILLED_SYRINGE | INTRAVENOUS | Status: DC | PRN
  Administered 2021-01-25 (×2): 120 ug via INTRAVENOUS

## 2021-01-25 SURGICAL SUPPLY — 45 items
BANDAGE ESMARK 6X9 LF (GAUZE/BANDAGES/DRESSINGS) IMPLANT
BLADE SAW RECIP 87.9 MT (BLADE) ×2 IMPLANT
BLADE SURG 21 STRL SS (BLADE) ×2 IMPLANT
BNDG CMPR 9X6 STRL LF SNTH (GAUZE/BANDAGES/DRESSINGS) ×1
BNDG COHESIVE 6X5 TAN STRL LF (GAUZE/BANDAGES/DRESSINGS) IMPLANT
BNDG ESMARK 6X9 LF (GAUZE/BANDAGES/DRESSINGS) ×2
CANISTER WOUND CARE 500ML ATS (WOUND CARE) ×2 IMPLANT
CANISTER WOUNDNEG PRESSURE 500 (CANNISTER) ×1 IMPLANT
COVER SURGICAL LIGHT HANDLE (MISCELLANEOUS) ×2 IMPLANT
COVER WAND RF STERILE (DRAPES) IMPLANT
CUFF TOURN SGL QUICK 34 (TOURNIQUET CUFF) ×2
CUFF TRNQT CYL 34X4.125X (TOURNIQUET CUFF) ×1 IMPLANT
DRAPE DERMATAC (DRAPES) ×4 IMPLANT
DRAPE INCISE IOBAN 66X45 STRL (DRAPES) ×2 IMPLANT
DRAPE U-SHAPE 47X51 STRL (DRAPES) ×2 IMPLANT
DRESSING PREVENA PLUS CUSTOM (GAUZE/BANDAGES/DRESSINGS) ×1 IMPLANT
DRSG PREVENA PLUS CUSTOM (GAUZE/BANDAGES/DRESSINGS) ×2
DRSG VAC ATS LRG SENSATRAC (GAUZE/BANDAGES/DRESSINGS) ×1 IMPLANT
DURAPREP 26ML APPLICATOR (WOUND CARE) ×2 IMPLANT
ELECT REM PT RETURN 9FT ADLT (ELECTROSURGICAL) ×2
ELECTRODE REM PT RTRN 9FT ADLT (ELECTROSURGICAL) ×1 IMPLANT
GLOVE SURG ORTHO LTX SZ9 (GLOVE) ×2 IMPLANT
GLOVE SURG UNDER POLY LF SZ9 (GLOVE) ×2 IMPLANT
GOWN STRL REUS W/ TWL XL LVL3 (GOWN DISPOSABLE) ×2 IMPLANT
GOWN STRL REUS W/TWL XL LVL3 (GOWN DISPOSABLE) ×4
KIT BASIN OR (CUSTOM PROCEDURE TRAY) ×2 IMPLANT
KIT TURNOVER KIT B (KITS) ×2 IMPLANT
MANIFOLD NEPTUNE II (INSTRUMENTS) ×2 IMPLANT
NS IRRIG 1000ML POUR BTL (IV SOLUTION) ×2 IMPLANT
PACK ORTHO EXTREMITY (CUSTOM PROCEDURE TRAY) ×2 IMPLANT
PAD ARMBOARD 7.5X6 YLW CONV (MISCELLANEOUS) ×2 IMPLANT
PREVENA RESTOR ARTHOFORM 46X30 (CANNISTER) ×2 IMPLANT
SPONGE LAP 18X18 RF (DISPOSABLE) IMPLANT
SPONGE T-LAP 18X18 ~~LOC~~+RFID (SPONGE) ×5 IMPLANT
STAPLER VISISTAT 35W (STAPLE) IMPLANT
STOCKINETTE IMPERVIOUS LG (DRAPES) ×2 IMPLANT
SUT ETHILON 2 0 PSLX (SUTURE) IMPLANT
SUT SILK 2 0 (SUTURE) ×2
SUT SILK 2-0 18XBRD TIE 12 (SUTURE) ×1 IMPLANT
SUT VIC AB 1 CTX 27 (SUTURE) ×4 IMPLANT
SUT VIC AB 1 CTX 36 (SUTURE) ×2
SUT VIC AB 1 CTX36XBRD ANBCTR (SUTURE) IMPLANT
TOWEL GREEN STERILE (TOWEL DISPOSABLE) ×2 IMPLANT
TUBE CONNECTING 12X1/4 (SUCTIONS) ×2 IMPLANT
YANKAUER SUCT BULB TIP NO VENT (SUCTIONS) ×2 IMPLANT

## 2021-01-25 NOTE — Progress Notes (Signed)
PHARMACY - PHYSICIAN COMMUNICATION CRITICAL VALUE ALERT - BLOOD CULTURE IDENTIFICATION (BCID)  Mitchell [name] is an 50 y.o. male who presented to [facility] on 01/23/2021 with a chief complaint of AMS, fever, foot pain, boil on foot, and HA.  Assessment:  Started on broad-spectrum ABX for diabetic foot infection, now growing staph spp in 1 of 4 blood cx bottles, likely contaminant.  Name of physician (or Provider) Contacted: DOrtiz MD  Current antibiotics: linezolid and Zosyn  Changes to prescribed antibiotics recommended:  No changes at this time  Results for orders placed or performed during the hospital encounter of 01/23/21  Blood Culture ID Panel (Reflexed) (Collected: 01/23/2021  5:48 PM)  Result Value Ref Range   Enterococcus faecalis NOT DETECTED NOT DETECTED   Enterococcus Faecium NOT DETECTED NOT DETECTED   Listeria monocytogenes NOT DETECTED NOT DETECTED   Staphylococcus species DETECTED (A) NOT DETECTED   Staphylococcus aureus (BCID) NOT DETECTED NOT DETECTED   Staphylococcus epidermidis NOT DETECTED NOT DETECTED   Staphylococcus lugdunensis NOT DETECTED NOT DETECTED   Streptococcus species NOT DETECTED NOT DETECTED   Streptococcus agalactiae NOT DETECTED NOT DETECTED   Streptococcus pneumoniae NOT DETECTED NOT DETECTED   Streptococcus pyogenes NOT DETECTED NOT DETECTED   A.calcoaceticus-baumannii NOT DETECTED NOT DETECTED   Bacteroides fragilis NOT DETECTED NOT DETECTED   Enterobacterales NOT DETECTED NOT DETECTED   Enterobacter cloacae complex NOT DETECTED NOT DETECTED   Escherichia coli NOT DETECTED NOT DETECTED   Klebsiella aerogenes NOT DETECTED NOT DETECTED   Klebsiella oxytoca NOT DETECTED NOT DETECTED   Klebsiella pneumoniae NOT DETECTED NOT DETECTED   Proteus species NOT DETECTED NOT DETECTED   Salmonella species NOT DETECTED NOT DETECTED   Serratia marcescens NOT DETECTED NOT DETECTED   Haemophilus influenzae NOT DETECTED NOT DETECTED   Neisseria  meningitidis NOT DETECTED NOT DETECTED   Pseudomonas aeruginosa NOT DETECTED NOT DETECTED   Stenotrophomonas maltophilia NOT DETECTED NOT DETECTED   Candida albicans NOT DETECTED NOT DETECTED   Candida auris NOT DETECTED NOT DETECTED   Candida glabrata NOT DETECTED NOT DETECTED   Candida krusei NOT DETECTED NOT DETECTED   Candida parapsilosis NOT DETECTED NOT DETECTED   Candida tropicalis NOT DETECTED NOT DETECTED   Cryptococcus neoformans/gattii NOT DETECTED NOT DETECTED    Vernard Gambles, PharmD, BCPS  01/25/2021  12:26 AM

## 2021-01-25 NOTE — Progress Notes (Signed)
PT Cancellation Note  Patient Details Name: WINN MUEHL MRN: [id number] DOB: 24-Jul-1971   Cancelled Treatment:    Reason Eval/Treat Not Completed: Medical issues which prohibited therapy Has agreed to BKA and is now in OR. Will follow up on next date of service.    Madelaine Etienne, DPT, PN1   Supplemental Physical Therapist [facility]    Pager [phone] Acute Rehab Office [phone]

## 2021-01-25 NOTE — Progress Notes (Signed)
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           +---------+---------------+---------+-----------+----------+-------------------+ PTV      Full                                                             +---------+---------------+---------+-----------+----------+-------------------+ PERO                                                  Not well visualized +---------+---------------+---------+-----------+----------+-------------------+ Dopplered distal posterior tibial and anterior tibial arteries which demonstrate perfusion to the foot.  +----+---------------+---------+-----------+----------+--------------+  LEFTCompressibilityPhasicitySpontaneityPropertiesThrombus Aging +----+---------------+---------+-----------+----------+--------------+ CFV Full                                         pulsatile      +----+---------------+---------+-----------+----------+--------------+    Summary: RIGHT: - There is no evidence of deep vein thrombosis in the lower extremity. However, portions of this examination were limited- see technologist comments above.  - Ultrasound characteristics of enlarged lymph nodes are noted in the groin.  LEFT: - No evidence of common femoral vein obstruction.  *See table(s) above for measurements and observations. Electronically signed by Fabienne Bruns MD on 01/24/2021 at 7:06:52 PM.    Final    ECHOCARDIOGRAM LIMITED  Result Date: 01/24/2021    ECHOCARDIOGRAM  LIMITED REPORT   Patient Name:   MYKELL RAWL Date of Exam: 01/24/2021 Medical Rec #:  [id number]       Height:       71.5 in Accession #:    [id number]      Weight:       302.0 lb Date of Birth:  Oct 05, 1970        BSA:          2.525 m Patient Age:    50 years        BP:           105/68 mmHg Patient Gender: M               HR:           96 bpm. Exam Location:  Inpatient Procedure: Limited Echo, Cardiac Doppler, Color Doppler and Intracardiac            Opacification Agent Indications:    R07.9* Chest pain, unspecified. Elevated troponin.  History:        Patient has prior history of Echocardiogram examinations. CHF,                 Abnormal ECG, Stroke, Signs/Symptoms:Chest Pain and Bacteremia;                 Risk Factors:Sleep Apnea, Hypertension and Diabetes.  Sonographer:    Sheralyn Boatman RDCS Referring Phys: [id number] ANASTASSIA DOUTOVA  Sonographer Comments: Technically difficult study due to poor echo windows and patient is morbidly obese. Image acquisition challenging due to patient body habitus. Covid positive. IMPRESSIONS  1. Left ventricular ejection fraction, by estimation, is 20 to 25%. The left ventricle has severely  decreased function. The left ventricle demonstrates global hypokinesis. There is mild concentric left ventricular hypertrophy. Indeterminate diastolic filling due to E-A fusion.     No evidence of LV thrombus.  2. The mitral valve is grossly normal. Trivial mitral valve regurgitation.  3. The aortic valve was not well visualized. Aortic valve regurgitation is not visualized.  4. Tricuspid regurgitation signal is inadequate for assessing PA pressure.  5. The inferior vena cava is dilated in size with <50% respiratory variability, suggesting right atrial pressure of 15 mmHg. Comparison(s): A prior study was performed on 07/30/2019. Prior images reviewed side by side. EF is still significantly depressed. FINDINGS  Left Ventricle: No evidence of LV thrombus. Left ventricular ejection fraction, by estimation, is 20 to 25%. The left ventricle has severely decreased function. The left ventricle demonstrates global hypokinesis. Definity contrast agent was given IV to delineate the left ventricular endocardial borders. There is mild concentric left ventricular hypertrophy. Indeterminate diastolic filling due to E-A fusion. Right Ventricle: Tricuspid regurgitation signal is inadequate for assessing PA pressure. Pericardium: There is no evidence of pericardial effusion. Mitral Valve: The mitral valve is grossly normal. Trivial mitral valve regurgitation. Tricuspid Valve: The tricuspid valve is not well visualized. Tricuspid valve regurgitation is not demonstrated. Aortic Valve: The aortic valve was not well visualized. Aortic valve regurgitation is not visualized. Pulmonic Valve: The pulmonic valve was not well visualized. Aorta: The aortic root and ascending aorta are structurally normal, with no evidence of dilitation for age and BSA. Venous: The inferior vena cava is dilated in size with less than 50% respiratory variability, suggesting right atrial pressure of 15 mmHg. LEFT VENTRICLE PLAX 2D LVIDd:         6.80 cm LVIDs:          5.90 cm LV PW:  1.30 cm LV IVS:        1.30 cm LVOT diam:     2.20 cm LVOT Area:     3.80 cm  IVC IVC diam: 2.90 cm LEFT ATRIUM         Index LA diam:    4.90 cm 1.94 cm/m   AORTA Ao Root diam: 3.80 cm Ao Asc diam:  3.80 cm MITRAL VALVE MV Area (PHT): 5.54 cm    SHUNTS MV Decel Time: 137 msec    Systemic Diam: 2.20 cm MV E velocity: 83.60 cm/s MV A velocity: 52.30 cm/s MV E/A ratio:  1.60 Riley Lam MD Electronically signed by Riley Lam MD Signature Date/Time: 01/24/2021/12:59:43 PM    Final

## 2021-01-25 NOTE — Anesthesia Postprocedure Evaluation (Signed)
Anesthesia Post Note  Patient: Mitchell [name]  Procedure(s) Performed: RIGHT BELOW KNEE AMPUTATION (Right: Knee)     Patient location during evaluation: Other Anesthesia Type: General Level of consciousness: awake and alert Pain management: pain level controlled Vital Signs Assessment: post-procedure vital signs reviewed and stable Respiratory status: spontaneous breathing, nonlabored ventilation, respiratory function stable and patient connected to nasal cannula oxygen Cardiovascular status: blood pressure returned to baseline and stable Postop Assessment: no apparent nausea or vomiting Anesthetic complications: no   No notable events documented.  Last Vitals:  Vitals:   01/25/21 1143 01/25/21 1208  BP: 92/62 119/82  Pulse: 86 84  Resp: 13 16  Temp: 36.9 C 37.1 C  SpO2: 96% 93%    Last Pain:  Vitals:   01/25/21 1208  TempSrc: Oral  PainSc:                  Resha Filippone COKER

## 2021-01-25 NOTE — Anesthesia Procedure Notes (Signed)
Procedure Name: Intubation Date/Time: 01/25/2021 10:26 AM Performed by: Janace Litten, CRNA Pre-anesthesia Checklist: Patient identified, Emergency Drugs available, Suction available and Patient being monitored Patient Re-evaluated:Patient Re-evaluated prior to induction Oxygen Delivery Method: Circle System Utilized Preoxygenation: Pre-oxygenation with 100% oxygen Induction Type: IV induction and Rapid sequence Laryngoscope Size: Mac and 4 Grade View: Grade I Tube type: Oral Tube size: 7.5 mm Number of attempts: 1 Airway Equipment and Method: Stylet and Oral airway Placement Confirmation: ETT inserted through vocal cords under direct vision, positive ETCO2 and breath sounds checked- equal and bilateral Secured at: 23 cm Tube secured with: Tape Dental Injury: Teeth and Oropharynx as per pre-operative assessment

## 2021-01-25 NOTE — Telephone Encounter (Signed)
Called pt- LMOM.  He already had surgery I believe.  Advised that in case of a gangrenous limb amputation is necessary and is a life-saving surgery, I think this was the right thing to do.  We are thinking of him, please let me know if we can do anything to help
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
## 2021-01-25 NOTE — Progress Notes (Signed)
Inpatient Rehab Admissions Coordinator:    CIR consult received. Note that Pt  has not been seen by therapy yet, so I can not make a determination on candidacy for CIR.  Pt. Also noted to be COVID+ 6/26, and per CIR policy, patients are eligible to be considered for admit to the [facility] when cleared from airborne precautions by Acute MD, or Infectious Disease MD.  Otherwise, they will need to be >20 days from their positive test with recovery/improvement in symptoms or 2 negative tests.    Megan Salon, MS, CCC-SLP Rehab Admissions Coordinator  [phone] (celll) [phone] (office)

## 2021-01-25 NOTE — Anesthesia Preprocedure Evaluation (Signed)
Anesthesia Evaluation  Patient identified by MRN, date of birth, ID band Patient awake    Reviewed: Allergy & Precautions, NPO status , Patient's Chart, lab work & pertinent test results  Airway Mallampati: III  TM Distance: >3 FB     Dental  (+) Edentulous Upper, Edentulous Lower   Pulmonary     + decreased breath sounds      Cardiovascular hypertension,  Rhythm:Regular Rate:Tachycardia     Neuro/Psych    GI/Hepatic   Endo/Other  diabetes  Renal/GU      Musculoskeletal   Abdominal (+) + obese,   Peds  Hematology   Anesthesia Other Findings   Reproductive/Obstetrics                             Anesthesia Physical Anesthesia Plan  ASA: 4 and emergent  Anesthesia Plan: General   Post-op Pain Management:    Induction: Intravenous  PONV Risk Score and Plan: Ondansetron  Airway Management Planned: Oral ETT  Additional Equipment:   Intra-op Plan:   Post-operative Plan: Extubation in OR  Informed Consent: I have reviewed the patients History and Physical, chart, labs and discussed the procedure including the risks, benefits and alternatives for the proposed anesthesia with the patient or authorized representative who has indicated his/her understanding and acceptance.       Plan Discussed with: Anesthesiologist and CRNA  Anesthesia Plan Comments:         Anesthesia Quick Evaluation

## 2021-01-25 NOTE — Op Note (Signed)
   Date of Surgery: 01/25/2021  INDICATIONS: Mr. Cothern is a 50 y.o.-year-old male who presents with abscess right foot with sepsis secondary to the infection.[name] Kitchen  PREOPERATIVE DIAGNOSIS: Abscess osteomyelitis right foot with secondary sepsis.  POSTOPERATIVE DIAGNOSIS: Same.  PROCEDURE: Transtibial amputation Application of Prevena wound VAC  SURGEON: Lajoyce Corners, M.D.  ANESTHESIA:  general  IV FLUIDS AND URINE: See anesthesia records.  ESTIMATED BLOOD LOSS: See anesthesia records.  COMPLICATIONS: None.  DESCRIPTION OF PROCEDURE: The patient was brought to the operating room after undergoing regional anesthetic. After adequate levels of anesthesia were obtained patient's lower extremity was prepped using DuraPrep draped into a sterile field. A timeout was called. The foot was draped out of the sterile field with impervious stockinette. A transverse incision was made 11 cm distal to the tibial tubercle. This curved proximally and a large posterior flap was created. The tibia was transected 1 cm proximal to the skin incision. The fibula was transected just proximal to the tibial incision. The tibia was beveled anteriorly. A large posterior flap was created. The sciatic nerve was pulled cut and allowed to retract. The vascular bundles were suture ligated with 2-0 silk. The deep and superficial fascial layers were closed using #1 Vicryl. The skin was closed using staples and 2-0 nylon. The wound was covered with a Prevena customizable and arthroform wound VAC.  The dressing was sealed with dermatac there was a good suction fit. A prosthetic shrinker and limb protector were applied. Patient was taken to the PACU in stable condition.   DISCHARGE PLANNING:  Antibiotic duration: 24 hours  Weightbearing: Nonweightbearing on the operative extremity  Pain medication: Opioid pathway  Dressing care/ Wound VAC: Continue wound VAC for 1 week after discharge  Discharge to: Discharge planning based on  therapy's recommendations for possible inpatient rehabilitation, outpatient rehabilitation, or discharge to home with therapy  Follow-up: In the office 1 week post operative.  Aldean Baker, MD [facility] 11:09 AM

## 2021-01-25 NOTE — Progress Notes (Signed)
OT Cancellation Note  Patient Details Name: Mitchell [name] MRN: [id number] DOB: 1971-01-15   Cancelled Treatment:    Reason Eval/Treat Not Completed: Patient at procedure or test/ unavailable: Pt off floor for surgery. Will hold today and attempt tomorrow if pt is medically cleared to see.   Theodoro Clock 01/25/2021, 11:44 AM

## 2021-01-25 NOTE — Progress Notes (Addendum)
TRH night history of PCU coverage note.  The patient's potassium level this morning was 3.2 mmol/L.  His volume of distribution was 133.1 kg.  He has been admitted due to sepsis secondary to cellulitis complicated with AKI showing progressively worse creatinine today at 4.21 mg/dL increased from 1.50 mg/dL yesterday.  He is getting Lantus 20 units SQ twice daily along with RI SS.  KCl 20 mEq p.o. x1 dose ordered.  Sanda Klein, MD.

## 2021-01-25 NOTE — Progress Notes (Signed)
   01/24/21 2237  Assess: MEWS Score  Temp (!) 100.5 F (38.1 C)  BP 111/70  Pulse Rate 96  ECG Heart Rate 97  Resp (!) 33  SpO2 100 %  O2 Device Room Air  Assess: MEWS Score  MEWS Temp 1  MEWS Systolic 0  MEWS Pulse 0  MEWS RR 2  MEWS LOC 0  MEWS Score 3  MEWS Score Color Yellow  Assess: if the MEWS score is Yellow or Red  Were vital signs taken at a resting state? Yes  Focused Assessment No change from prior assessment  Early Detection of Sepsis Score *See Row Information* High  MEWS guidelines implemented *See Row Information* Yes  Treat  MEWS Interventions Administered scheduled meds/treatments  Pain Scale 0-10  Pain Score 0  Take Vital Signs  Increase Vital Sign Frequency  Yellow: Q 2hr X 2 then Q 4hr X 2, if remains yellow, continue Q 4hrs (patient had been yellow all day, will continue with previous vital signs)  Escalate  MEWS: Escalate Yellow: discuss with charge nurse/RN and consider discussing with provider and RRT  Notify: Charge Nurse/RN  Name of Charge Nurse/RN Notified Nicole, RN  Date Charge Nurse/RN Notified 01/24/21  Time Charge Nurse/RN Notified 2250  Document  Patient Outcome Stabilized after interventions (Patient remains stable, vital signs check continues)  Progress note created (see row info) Yes

## 2021-01-25 NOTE — Progress Notes (Signed)
Patient remains alert and oriented, asking so many questions concerning his surgical procedure scheduled for 01/25/21. Some questions were answered per scope of practice. Patient requested to speak with a Doctor and he verbalized he will not do the surgery scheduled. Dr. Robb Matar D was paged and the Dr. Robb Matar said the day shift doctor will be able to talk to him better concerning his surgery. Pt has verbalized he will not do the surgery and he does not want to consent to the procedure any more so his consent has been taken out.

## 2021-01-25 NOTE — Interval H&P Note (Signed)
History and Physical Interval Note:  01/25/2021 6:38 AM  Mitchell [name]  has presented today for surgery, with the diagnosis of Abscess Right Foot.  The various methods of treatment have been discussed with the patient and family. After consideration of risks, benefits and other options for treatment, the patient has consented to  Procedure(s): RIGHT BELOW KNEE AMPUTATION (Right) as a surgical intervention.  The patient's history has been reviewed, patient examined, no change in status, stable for surgery.  I have reviewed the patient's chart and labs.  Questions were answered to the patient's satisfaction.     Nadara Mustard

## 2021-01-25 NOTE — Telephone Encounter (Signed)
Hi Mitchell [name], I am on call.  It is 7:45 AM. I was called by our answering service RN this morning.  Mitchell [name] is in the hospital with COVID Mitchell [name]).  He told her that he is being taken down to ER to have his leg amputated.  He wants to talk to you before they operate on him.  His phone is [phone]. Hope you are well, AP

## 2021-01-25 NOTE — Progress Notes (Signed)
Patient ID: Mitchell [name], male   DOB: 1971/04/20, 50 y.o.   MRN: [id number] I spoke with the patient this morning at bedside and his wife was on the phone.  We reviewed the clinical findings of sepsis with the large abscess involving his foot.  Discussed that the safest treatment is to proceed with a below the knee amputation through the mid calf.   Patient and his wife stated that all their questions are answered and they both wish to proceed with surgery.  Plan for surgery this morning at 9 or 10:00.

## 2021-01-25 NOTE — Telephone Encounter (Signed)
Error

## 2021-01-25 NOTE — Progress Notes (Signed)
Orthopedic Tech Progress Note Patient Details:  Mitchell [name] [birth year] [id number]  Called in order to [facility] for a VIVE PROTOCOL BK   Patient ID: Mitchell [name], male   DOB: 25-Mar-1971, 50 y.o.   MRN: [id number]  [name] 01/25/2021, 12:34 PM

## 2021-01-25 NOTE — Transfer of Care (Signed)
Immediate Anesthesia Transfer of Care Note  Patient: Mitchell [name]  Procedure(s) Performed: RIGHT BELOW KNEE AMPUTATION (Right: Knee)  Patient Location: PACU  Anesthesia Type:General  Level of Consciousness: drowsy, patient cooperative and responds to stimulation  Airway & Oxygen Therapy: Patient Spontanous Breathing  Post-op Assessment: Report given to RN and Post -op Vital signs reviewed and stable  Post vital signs: Reviewed and stable  Last Vitals:  Vitals Value Taken Time  BP 104/75 01/25/21 1118  Temp    Pulse 93 01/25/21 1122  Resp 18 01/25/21 1122  SpO2 96 % 01/25/21 1122  Vitals shown include unvalidated device data.  Last Pain:  Vitals:   01/25/21 0755  TempSrc: Oral  PainSc:       Patients Stated Pain Goal: 0 (01/25/21 0600)  Complications: No notable events documented.

## 2021-01-25 NOTE — Consult Note (Signed)
NAME:  Mitchell [name], MRN:  [id number], DOB:  1971/07/11, LOS: 2 ADMISSION DATE:  01/23/2021, CONSULTATION DATE:  01/24/21 REFERRING MD:  Dr Thedore Mins, CHIEF COMPLAINT:  ischemic R foot   History of Present Illness:  50 yo male with pmh ICM with LVE 20-25%, cva, osa, pulm htn, dm2 uncontrolled who presented with R ischemic foot and subsequent sepsis. He was initially offered amputation and declined until he could speak with family and has represented yesterday for such procedure. CCM was called for potential decompensation prior to or after surgery.   Pt also noted to have elevated trop, volume overload in acute decompensated hf, acute renal failure. Also has been diagnosed with covid last week per pt (has positive from 08/27/20 as well)  Pertinent  Medical History  Icm HFrEF (20-25%) Bipolar Cva Dm2 Osa Htn hyperlipidemia  Significant Hospital Events: Including procedures, antibiotic start and stop dates in addition to other pertinent events   Linezolid, zosyn, vanc Remdesivir Blood cx 6/26: staph 1/2 >>  Interim History / Subjective:   Low-grade febrile. Able to lie supine in bed On room air Denies pain  Objective   Blood pressure 108/78, pulse 93, temperature 98.3 F (36.8 C), temperature source Oral, resp. rate 18, height 6' (1.829 m), weight 133.1 kg, SpO2 99 %.        Intake/Output Summary (Last 24 hours) at 01/25/2021 0917 Last data filed at 01/25/2021 0600 Gross per 24 hour  Intake 1416.36 ml  Output 400 ml  Net 1016.36 ml    Filed Weights   01/23/21 2200 01/24/21 1423  Weight: (!) 137 kg 133.1 kg    Examination: General: Lying supine, no distress HENT: ncat, eomi, mild pallor, no icterus Lungs: Decreased breath sounds bilateral, no accessory muscle use Cardiovascular: S1-S2 distant Abdomen: obese, taut skin  Extremities: 2+ edema R >> L, blistering upto Rshin with necrotic sloughing skin and active drainage.  Neuro: aaox3 GU: deferred  Resolved Hospital  Problem list   None   Assessment & Plan:  Severe sepsis 2/2 right foot necrotizing abscess due to bony injury with?  Osteomyelitis -cont Zosyn/linezolid till ID of staff is clear -Below-knee amputation planned for today   AKI, likely ATN , baseline creatinine 1.1 -nephrology following.  -not ideal outpt dialysis candidate -Hypokalemia repletion per renal  Icm with acute decompensated HFrEF:  -per primary Dm2 uncontrolled:  -per primary  Incidental COVID infection -on remdesivir , chest x-ray independently reviewed shows mild hazy opacity at right lung base, cardiomegaly. Since he is not hypoxic does not need steroids.  PCCM will be available as needed, please reconsult if he has problems postoperatively  Best Practice (right click and "Reselect all SmartList Selections" daily)   Diet/type: NPO DVT prophylaxis: prophylactic heparin  GI prophylaxis: N/A Lines: N/A Foley:  N/A Code Status:  full code Last date of multidisciplinary goals of care discussion [per primary]  Labs   CBC: Recent Labs  Lab 01/23/21 1729 01/23/21 1737 01/24/21 0230 01/25/21 0105  WBC 22.0*  --  22.1* 21.1*  NEUTROABS 19.2*  --  18.5* 17.4*  HGB 12.2* 13.6  13.3 11.4* 11.4*  HCT 39.0 40.0  39.0 35.8* 35.5*  MCV 94.7  --  94.2 92.7  PLT 213  --  218 205     Basic Metabolic Panel: Recent Labs  Lab 01/23/21 1729 01/23/21 1737 01/23/21 2136 01/24/21 0230 01/25/21 0105  NA 135 136  136  --  135 136  K 3.8 3.8  3.8  --  3.5 3.2*  CL 99 98  --  99 100  CO2 24  --   --  26 24  GLUCOSE 355* 366*  --  256* 85  BUN 30* 31*  --  35* 38*  CREATININE 2.41* 2.30*  --  3.06* 4.21*  CALCIUM 8.1*  --   --  7.7* 8.0*  MG  --   --  1.8 1.9 2.2  PHOS  --   --  2.3* 2.2* 3.4    GFR: Estimated Creatinine Clearance: 29 mL/min (A) (by C-G formula based on SCr of 4.21 mg/dL (H)). Recent Labs  Lab 01/23/21 1729 01/23/21 1940 01/23/21 2009 01/23/21 2108 01/23/21 2136 01/23/21 2245  01/24/21 0230 01/25/21 0105  PROCALCITON  --  2.44  --   --  2.65  --  2.98 3.13  WBC 22.0*  --   --   --   --   --  22.1* 21.1*  LATICACIDVEN 2.0*  --  1.9 1.8  --  2.2*  --   --      Liver Function Tests: Recent Labs  Lab 01/23/21 1729 01/24/21 0230 01/25/21 0105  AST 27 30 29   ALT 15 14 16   ALKPHOS 91 75 71  BILITOT 1.2 0.8 0.8  PROT 7.3 6.6 6.7  ALBUMIN 2.7* 2.4* 2.3*    No results for input(s): LIPASE, AMYLASE in the last 168 hours. Recent Labs  Lab 01/23/21 2108  AMMONIA 11     ABG    Component Value Date/Time   PHART 7.372 09/26/2017 1159   PCO2ART 45.6 09/26/2017 1159   PO2ART 63.0 (L) 09/26/2017 1159   HCO3 28.6 (H) 01/23/2021 1737   TCO2 27 01/23/2021 1737   TCO2 30 01/23/2021 1737   ACIDBASEDEF 3.0 (H) 05/29/2007 1906   O2SAT 86.0 01/23/2021 1737      Coagulation Profile: Recent Labs  Lab 01/23/21 1755  INR 1.2     Cardiac Enzymes: Recent Labs  Lab 01/23/21 2136 01/24/21 1626  CKTOTAL 431* 433*     HbA1C: Hgb A1c MFr Bld  Date/Time Value Ref Range Status  01/23/2021 09:26 PM 8.1 (H) 4.8 - 5.6 % Final    Comment:    (NOTE)         Prediabetes: 5.7 - 6.4         Diabetes: >6.4         Glycemic control for adults with diabetes: <7.0   08/04/2020 10:54 AM 8.1 (H) 4.6 - 6.5 % Final    Comment:    Glycemic Control Guidelines for People with Diabetes:Non Diabetic:  <6%Goal of Therapy: <7%Additional Action Suggested:  >8%     CBG: Recent Labs  Lab 01/24/21 1603 01/24/21 1935 01/24/21 2334 01/25/21 0342 01/25/21 0800  GLUCAP 140* 109* 117* 84 96     01/27/21 MD. FCCP. [facility] Pager : [phone]  If no response to pager , please call [phone] until 7 pm After 7:00 pm call Elink  [phone]   01/25/2021
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
## 2021-01-25 NOTE — Plan of Care (Signed)
  Problem: Clinical Measurements: Goal: Will remain free from infection Outcome: Progressing Goal: Respiratory complications will improve Outcome: Progressing Goal: Cardiovascular complication will be avoided Outcome: Progressing   Problem: Coping: Goal: Level of anxiety will decrease Outcome: Progressing   

## 2021-01-26 ENCOUNTER — Inpatient Hospital Stay (HOSPITAL_COMMUNITY): Payer: No Typology Code available for payment source

## 2021-01-26 ENCOUNTER — Encounter (HOSPITAL_COMMUNITY): Payer: Self-pay | Admitting: Orthopedic Surgery

## 2021-01-26 DIAGNOSIS — I5043 Acute on chronic combined systolic (congestive) and diastolic (congestive) heart failure: Secondary | ICD-10-CM

## 2021-01-26 LAB — CBC WITH DIFFERENTIAL/PLATELET
Abs Immature Granulocytes: 0.48 10*3/uL — ABNORMAL HIGH (ref 0.00–0.07)
Basophils Absolute: 0.1 10*3/uL (ref 0.0–0.1)
Basophils Relative: 0 %
Eosinophils Absolute: 0 10*3/uL (ref 0.0–0.5)
Eosinophils Relative: 0 %
HCT: 34.1 % — ABNORMAL LOW (ref 39.0–52.0)
Hemoglobin: 11.1 g/dL — ABNORMAL LOW (ref 13.0–17.0)
Immature Granulocytes: 2 %
Lymphocytes Relative: 6 %
Lymphs Abs: 1.2 10*3/uL (ref 0.7–4.0)
MCH: 29.6 pg (ref 26.0–34.0)
MCHC: 32.6 g/dL (ref 30.0–36.0)
MCV: 90.9 fL (ref 80.0–100.0)
Monocytes Absolute: 1.6 10*3/uL — ABNORMAL HIGH (ref 0.1–1.0)
Monocytes Relative: 8 %
Neutro Abs: 17.8 10*3/uL — ABNORMAL HIGH (ref 1.7–7.7)
Neutrophils Relative %: 84 %
Platelets: 270 10*3/uL (ref 150–400)
RBC: 3.75 MIL/uL — ABNORMAL LOW (ref 4.22–5.81)
RDW: 13 % (ref 11.5–15.5)
WBC: 20.8 10*3/uL — ABNORMAL HIGH (ref 4.0–10.5)
nRBC: 0 % (ref 0.0–0.2)

## 2021-01-26 LAB — GLUCOSE, CAPILLARY
Glucose-Capillary: 107 mg/dL — ABNORMAL HIGH (ref 70–99)
Glucose-Capillary: 129 mg/dL — ABNORMAL HIGH (ref 70–99)
Glucose-Capillary: 132 mg/dL — ABNORMAL HIGH (ref 70–99)
Glucose-Capillary: 139 mg/dL — ABNORMAL HIGH (ref 70–99)
Glucose-Capillary: 177 mg/dL — ABNORMAL HIGH (ref 70–99)
Glucose-Capillary: 233 mg/dL — ABNORMAL HIGH (ref 70–99)

## 2021-01-26 LAB — COMPREHENSIVE METABOLIC PANEL
ALT: 16 U/L (ref 0–44)
AST: 26 U/L (ref 15–41)
Albumin: 2 g/dL — ABNORMAL LOW (ref 3.5–5.0)
Alkaline Phosphatase: 63 U/L (ref 38–126)
Anion gap: 11 (ref 5–15)
BUN: 46 mg/dL — ABNORMAL HIGH (ref 6–20)
CO2: 23 mmol/L (ref 22–32)
Calcium: 7.8 mg/dL — ABNORMAL LOW (ref 8.9–10.3)
Chloride: 99 mmol/L (ref 98–111)
Creatinine, Ser: 5.05 mg/dL — ABNORMAL HIGH (ref 0.61–1.24)
GFR, Estimated: 13 mL/min — ABNORMAL LOW (ref >60)
Glucose, Bld: 266 mg/dL — ABNORMAL HIGH (ref 70–99)
Potassium: 4 mmol/L (ref 3.5–5.1)
Sodium: 133 mmol/L — ABNORMAL LOW (ref 135–145)
Total Bilirubin: 0.5 mg/dL (ref 0.3–1.2)
Total Protein: 6.4 g/dL — ABNORMAL LOW (ref 6.5–8.1)

## 2021-01-26 LAB — BRAIN NATRIURETIC PEPTIDE: B Natriuretic Peptide: 503.1 pg/mL — ABNORMAL HIGH (ref 0.0–100.0)

## 2021-01-26 LAB — TYPE AND SCREEN
ABO/RH(D): O POS
Antibody Screen: NEGATIVE

## 2021-01-26 LAB — PHOSPHORUS: Phosphorus: 5.9 mg/dL — ABNORMAL HIGH (ref 2.5–4.6)

## 2021-01-26 LAB — MAGNESIUM: Magnesium: 2.3 mg/dL (ref 1.7–2.4)

## 2021-01-26 LAB — PROCALCITONIN: Procalcitonin: 2.02 ng/mL

## 2021-01-26 LAB — C-REACTIVE PROTEIN: CRP: 21.1 mg/dL — ABNORMAL HIGH (ref <1.0)

## 2021-01-26 MED ORDER — SODIUM CHLORIDE 0.9% FLUSH
3.0000 mL | Freq: Two times a day (BID) | INTRAVENOUS | Status: DC
  Administered 2021-01-26 – 2021-02-01 (×12): 3 mL via INTRAVENOUS

## 2021-01-26 MED ORDER — CEFAZOLIN IN SODIUM CHLORIDE 3-0.9 GM/100ML-% IV SOLN
3.0000 g | INTRAVENOUS | Status: DC
  Filled 2021-01-26: qty 100

## 2021-01-26 MED ORDER — SODIUM CHLORIDE 0.9 % IV SOLN: INTRAVENOUS | Status: DC

## 2021-01-26 MED ORDER — SODIUM CHLORIDE 0.9% FLUSH
3.0000 mL | INTRAVENOUS | Status: DC | PRN
  Administered 2021-01-26: 3 mL via INTRAVENOUS

## 2021-01-26 MED ORDER — MAGNESIUM SULFATE 2 GM/50ML IV SOLN
2.0000 g | Freq: Once | INTRAVENOUS | Status: AC
  Administered 2021-01-26: 2 g via INTRAVENOUS
  Filled 2021-01-26: qty 50

## 2021-01-26 MED ORDER — SODIUM CHLORIDE 0.9 % IV SOLN: 250.0000 mL | INTRAVENOUS | Status: DC | PRN

## 2021-01-26 MED ORDER — ASPIRIN 81 MG PO CHEW
81.0000 mg | CHEWABLE_TABLET | ORAL | Status: AC
  Administered 2021-01-27: 81 mg via ORAL
  Filled 2021-01-26: qty 1

## 2021-01-26 NOTE — Progress Notes (Signed)
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                                                             +---------+---------------+---------+-----------+----------+-------------------+ FV Mid   Full                                                             +---------+---------------+---------+-----------+----------+-------------------+ FV DistalFull                                                             +---------+---------------+---------+-----------+----------+-------------------+ PFV      Full                                                              +---------+---------------+---------+-----------+----------+-------------------+ POP      Full                                         pulsatile           +---------+---------------+---------+-----------+----------+-------------------+ PTV      Full                                                             +---------+---------------+---------+-----------+----------+-------------------+ PERO  Not well visualized +---------+---------------+---------+-----------+----------+-------------------+ Dopplered distal posterior tibial and anterior tibial arteries which demonstrate perfusion to the foot.  +----+---------------+---------+-----------+----------+--------------+ LEFTCompressibilityPhasicitySpontaneityPropertiesThrombus Aging +----+---------------+---------+-----------+----------+--------------+ CFV Full                                         pulsatile      +----+---------------+---------+-----------+----------+--------------+    Summary: RIGHT: - There is no evidence of deep vein thrombosis in the lower extremity. However, portions of this examination were limited- see technologist comments above.  - Ultrasound characteristics of enlarged lymph nodes are noted in the groin.  LEFT: - No evidence of common femoral vein obstruction.  *See table(s) above for measurements and observations. Electronically signed by Fabienne Bruns MD on 01/24/2021 at 7:06:52 PM.    Final    ECHOCARDIOGRAM LIMITED  Result Date: 01/24/2021    ECHOCARDIOGRAM LIMITED REPORT   Patient Name:   ROWYN SPILDE Date of Exam: 01/24/2021 Medical Rec #:  [id number]       Height:       71.5 in Accession #:    [id number]      Weight:       302.0 lb Date of Birth:  1971-04-21        BSA:          2.525 m Patient Age:    50 years        BP:           105/68 mmHg Patient Gender: M               HR:           96 bpm. Exam Location:  Inpatient Procedure: Limited Echo,  Cardiac Doppler, Color Doppler and Intracardiac            Opacification Agent Indications:    R07.9* Chest pain, unspecified. Elevated troponin.  History:        Patient has prior history of Echocardiogram examinations. CHF,                 Abnormal ECG, Stroke, Signs/Symptoms:Chest Pain and Bacteremia;                 Risk Factors:Sleep Apnea, Hypertension and Diabetes.  Sonographer:    Sheralyn Boatman RDCS Referring Phys: [id number] ANASTASSIA DOUTOVA  Sonographer Comments: Technically difficult study due to poor echo windows and patient is morbidly obese. Image acquisition challenging due to patient body habitus. Covid positive. IMPRESSIONS  1. Left ventricular ejection fraction, by estimation, is 20 to 25%. The left ventricle has severely decreased function. The left ventricle demonstrates global hypokinesis. There is mild concentric left ventricular hypertrophy. Indeterminate diastolic filling due to E-A fusion.     No evidence of LV thrombus.  2. The mitral valve is grossly normal. Trivial mitral valve regurgitation.  3. The aortic valve was not well visualized. Aortic valve regurgitation is not visualized.  4. Tricuspid regurgitation signal is inadequate for assessing PA pressure.  5. The inferior vena cava is dilated in size with <50% respiratory variability, suggesting right atrial pressure of 15 mmHg. Comparison(s): A prior study was performed on 07/30/2019. Prior images reviewed side by side. EF is still significantly depressed. FINDINGS  Left Ventricle: No evidence of LV thrombus. Left ventricular ejection fraction, by estimation, is 20 to 25%. The left ventricle has severely decreased function. The left ventricle demonstrates global hypokinesis. Definity contrast agent was given IV to  delineate the left ventricular endocardial borders. There is mild concentric left ventricular hypertrophy. Indeterminate diastolic filling due to E-A fusion. Right Ventricle: Tricuspid regurgitation signal is inadequate for assessing  PA pressure. Pericardium: There is no evidence of pericardial effusion. Mitral Valve: The mitral valve is grossly normal. Trivial mitral valve regurgitation. Tricuspid Valve: The tricuspid valve is not well visualized. Tricuspid valve regurgitation is not demonstrated. Aortic Valve: The aortic valve was not well visualized. Aortic valve regurgitation is not visualized. Pulmonic Valve: The pulmonic valve was not well visualized. Aorta: The aortic root and ascending aorta are structurally normal, with no evidence of dilitation for age and BSA. Venous: The inferior vena cava is dilated in size with less than 50% respiratory variability, suggesting right atrial pressure of 15 mmHg. LEFT VENTRICLE PLAX 2D LVIDd:         6.80 cm LVIDs:         5.90 cm LV PW:         1.30 cm LV IVS:        1.30 cm LVOT diam:     2.20 cm LVOT Area:     3.80 cm  IVC IVC diam: 2.90 cm LEFT ATRIUM         Index LA diam:    4.90 cm 1.94 cm/m   AORTA Ao Root diam: 3.80 cm Ao Asc diam:  3.80 cm MITRAL VALVE MV Area (PHT): 5.54 cm    SHUNTS MV Decel Time: 137 msec    Systemic Diam: 2.20 cm MV E velocity: 83.60 cm/s MV A velocity: 52.30 cm/s MV E/A ratio:  1.60 Riley Lam MD Electronically signed by Riley Lam MD Signature Date/Time: 01/24/2021/12:59:43 PM    Final
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
## 2021-01-26 NOTE — Progress Notes (Signed)
   Called to patients room to discuss risks/benefits for schedule right cardiac catheterization however he wishes for me to contact his wife Albin Felling given that he has been taking pain medications related to his recent surgery. Attempted to contact wife per provided telephone number with no response. If patient's wide arrives at bedside, please call on call APP to provide information about upcoming procedure tomorrow.   Georgie Chard NP-C [facility] Pager: [phone]
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
## 2021-01-26 NOTE — Progress Notes (Addendum)
Progress Note  Patient Name: Mitchell [name] Date of Encounter: 01/26/2021  Primary Cardiologist: Arvilla Meres, MD   Subjective   NO specific complaints this AM. NSVT on telemetry>>will replace K+ and Mg+ and have AHF team follow as well. Likely due to poor LV function   Inpatient Medications    Scheduled Meds:  vitamin C  1,000 mg Oral Daily   aspirin EC  81 mg Oral Daily   [START ON 01/27/2021] heparin injection (subcutaneous)  5,000 Units Subcutaneous Q8H   insulin aspart  0-9 Units Subcutaneous Q4H   insulin glargine  20 Units Subcutaneous BID   metoprolol tartrate  12.5 mg Oral BID   multivitamin with minerals  1 tablet Oral Daily   nutrition supplement (JUVEN)  1 packet Oral BID BM   nutrition supplement (JUVEN)  1 packet Oral BID BM   pantoprazole  40 mg Oral Daily   potassium chloride  40 mEq Oral Once   Ensure Max Protein  11 oz Oral QHS   rosuvastatin  20 mg Oral Daily   zinc sulfate  220 mg Oral Daily   Continuous Infusions:  linezolid (ZYVOX) IV 600 mg (01/25/21 2046)   piperacillin-tazobactam (ZOSYN)  IV 3.375 g (01/25/21 2358)   PRN Meds: acetaminophen **OR** acetaminophen, alum & mag hydroxide-simeth, guaiFENesin-dextromethorphan, HYDROcodone-acetaminophen, HYDROmorphone (DILAUDID) injection, loperamide, ondansetron (ZOFRAN) IV, oxyCODONE, potassium chloride   Vital Signs    Vitals:   01/25/21 2010 01/25/21 2045 01/25/21 2354 01/26/21 0400  BP: 129/81 129/81 (!) 123/93 100/65  Pulse: 86 87 75 77  Resp: 14  15 17   Temp: 98.7 F (37.1 C)  98.9 F (37.2 C) 98.3 F (36.8 C)  TempSrc: Axillary  Axillary Oral  SpO2: 100%  99% 97%  Weight:    (!) 138.2 kg  Height:        Intake/Output Summary (Last 24 hours) at 01/26/2021 0810 Last data filed at 01/26/2021 0500 Gross per 24 hour  Intake 977.76 ml  Output 925 ml  Net 52.76 ml   Filed Weights   01/23/21 2200 01/24/21 1423 01/26/21 0400  Weight: (!) 137 kg 133.1 kg (!) 138.2 kg    Physical  Exam   General: Well developed, well nourished, NAD Neck: Negative for carotid bruits. Mild JVD Lungs:Clear to ausculation bilaterally. No wheezes, rales, or rhonchi. Breathing is unlabored. Cardiovascular: RRR with S1 S2. No murmur Abdomen: Soft, non-tender, non-distended. No obvious abdominal masses. Extremities: 1-2+ BLE edema. Neuro: Alert and oriented. No focal deficits. No facial asymmetry. MAE spontaneously. Psych: Responds to questions appropriately with normal affect.    Labs    Chemistry Recent Labs  Lab 01/24/21 0230 01/25/21 0105 01/26/21 0225  NA 135 136 133*  K 3.5 3.2* 4.0  CL 99 100 99  CO2 26 24 23   GLUCOSE 256* 85 266*  BUN 35* 38* 46*  CREATININE 3.06* 4.21* 5.05*  CALCIUM 7.7* 8.0* 7.8*  PROT 6.6 6.7 6.4*  ALBUMIN 2.4* 2.3* 2.0*  AST 30 29 26   ALT 14 16 16   ALKPHOS 75 71 63  BILITOT 0.8 0.8 0.5  GFRNONAA 24* 16* 13*  ANIONGAP 10 12 11      Hematology Recent Labs  Lab 01/24/21 0230 01/25/21 0105 01/26/21 0225  WBC 22.1* 21.1* 20.8*  RBC 3.80* 3.83* 3.75*  HGB 11.4* 11.4* 11.1*  HCT 35.8* 35.5* 34.1*  MCV 94.2 92.7 90.9  MCH 30.0 29.8 29.6  MCHC 31.8 32.1 32.6  RDW 12.9 13.1 13.0  PLT 218 205  270    Cardiac EnzymesNo results for input(s): TROPONINI in the last 168 hours. No results for input(s): TROPIPOC in the last 168 hours.   BNP Recent Labs  Lab 01/24/21 0230 01/25/21 0105 01/26/21 0225  BNP 522.3* 404.3* 503.1*     DDimer  Recent Labs  Lab 01/23/21 2145 01/24/21 0230  DDIMER 2.80* 3.07*     Radiology    US RENAL  Result Date: 01/24/2021 CLINICAL DATA:  Acute kidney injury Hypertension EXAM: RENAL / URINARY TRACT ULTRASOUND COMPLETE COMPARISON:  03/24/2011 FINDINGS: Right Kidney: Renal measurements: 15.7 x 6.2 x 6.8 cm = volume: 350 mL. Echogenicity within normal limits. No mass or hydronephrosis visualized. Left Kidney: Renal measurements: 14.7 x 6.4 x 6.0 cm = volume: 291 mL. Echogenicity within normal limits. No mass  or hydronephrosis visualized. Bladder: Appears normal for degree of bladder distention. Other: None. IMPRESSION: No significant sonographic abnormality of the kidneys. Electronically Signed   By: Acquanetta Belling M.D.   On: 01/24/2021 09:40   ECHOCARDIOGRAM LIMITED  Result Date: 01/24/2021    ECHOCARDIOGRAM LIMITED REPORT   Patient Name:   Mitchell [name] Date of Exam: 01/24/2021 Medical Rec #:  [id number]       Height:       71.5 in Accession #:    [id number]      Weight:       302.0 lb Date of Birth:  Sep 07, 1970        BSA:          2.525 m Patient Age:    50 years        BP:           105/68 mmHg Patient Gender: M               HR:           96 bpm. Exam Location:  Inpatient Procedure: Limited Echo, Cardiac Doppler, Color Doppler and Intracardiac            Opacification Agent Indications:    R07.9* Chest pain, unspecified. Elevated troponin.  History:        Patient has prior history of Echocardiogram examinations. CHF,                 Abnormal ECG, Stroke, Signs/Symptoms:Chest Pain and Bacteremia;                 Risk Factors:Sleep Apnea, Hypertension and Diabetes.  Sonographer:    Sheralyn Boatman RDCS Referring Phys: [id number] ANASTASSIA DOUTOVA  Sonographer Comments: Technically difficult study due to poor echo windows and patient is morbidly obese. Image acquisition challenging due to patient body habitus. Covid positive. IMPRESSIONS  1. Left ventricular ejection fraction, by estimation, is 20 to 25%. The left ventricle has severely decreased function. The left ventricle demonstrates global hypokinesis. There is mild concentric left ventricular hypertrophy. Indeterminate diastolic filling due to E-A fusion.     No evidence of LV thrombus.  2. The mitral valve is grossly normal. Trivial mitral valve regurgitation.  3. The aortic valve was not well visualized. Aortic valve regurgitation is not visualized.  4. Tricuspid regurgitation signal is inadequate for assessing PA pressure.  5. The inferior vena cava is dilated in  size with <50% respiratory variability, suggesting right atrial pressure of 15 mmHg. Comparison(s): A prior study was performed on 07/30/2019. Prior images reviewed side by side. EF is still significantly depressed. FINDINGS  Left Ventricle: No evidence of LV thrombus. Left ventricular ejection fraction, by estimation,  is 20 to 25%. The left ventricle has severely decreased function. The left ventricle demonstrates global hypokinesis. Definity contrast agent was given IV to delineate the left ventricular endocardial borders. There is mild concentric left ventricular hypertrophy. Indeterminate diastolic filling due to E-A fusion. Right Ventricle: Tricuspid regurgitation signal is inadequate for assessing PA pressure. Pericardium: There is no evidence of pericardial effusion. Mitral Valve: The mitral valve is grossly normal. Trivial mitral valve regurgitation. Tricuspid Valve: The tricuspid valve is not well visualized. Tricuspid valve regurgitation is not demonstrated. Aortic Valve: The aortic valve was not well visualized. Aortic valve regurgitation is not visualized. Pulmonic Valve: The pulmonic valve was not well visualized. Aorta: The aortic root and ascending aorta are structurally normal, with no evidence of dilitation for age and BSA. Venous: The inferior vena cava is dilated in size with less than 50% respiratory variability, suggesting right atrial pressure of 15 mmHg. LEFT VENTRICLE PLAX 2D LVIDd:         6.80 cm LVIDs:         5.90 cm LV PW:         1.30 cm LV IVS:        1.30 cm LVOT diam:     2.20 cm LVOT Area:     3.80 cm  IVC IVC diam: 2.90 cm LEFT ATRIUM         Index LA diam:    4.90 cm 1.94 cm/m   AORTA Ao Root diam: 3.80 cm Ao Asc diam:  3.80 cm MITRAL VALVE MV Area (PHT): 5.54 cm    SHUNTS MV Decel Time: 137 msec    Systemic Diam: 2.20 cm MV E velocity: 83.60 cm/s MV A velocity: 52.30 cm/s MV E/A ratio:  1.60 Riley Lam MD Electronically signed by Riley Lam MD Signature  Date/Time: 01/24/2021/12:59:43 PM    Final     Telemetry     01/26/21 NSR with NSVT/longer episode of VT - Personally Reviewed  ECG    No new tracing as of 01/26/21 - Personally Reviewed  Cardiac Studies   Echo 01/24/21 IMPRESSIONS    1. Left ventricular ejection fraction, by estimation, is 20 to 25%. The  left ventricle has severely decreased function. The left ventricle  demonstrates global hypokinesis. There is mild concentric left ventricular  hypertrophy. Indeterminate diastolic  filling due to E-A fusion.      No evidence of LV thrombus.   2. The mitral valve is grossly normal. Trivial mitral valve  regurgitation.   3. The aortic valve was not well visualized. Aortic valve regurgitation  is not visualized.   4. Tricuspid regurgitation signal is inadequate for assessing PA  pressure.   5. The inferior vena cava is dilated in size with <50% respiratory  variability, suggesting right atrial pressure of 15 mmHg.   Comparison(s): A prior study was performed on 07/30/2019. Prior images  reviewed side by side. EF is still significantly depressed.   FINDINGS   Left Ventricle: No evidence of LV thrombus. Left ventricular ejection  fraction, by estimation, is 20 to 25%. The left ventricle has severely  decreased function. The left ventricle demonstrates global hypokinesis.  Definity contrast agent was given IV to  delineate the left ventricular endocardial borders. There is mild  concentric left ventricular hypertrophy. Indeterminate diastolic filling  due to E-A fusion.   Right Ventricle: Tricuspid regurgitation signal is inadequate for  assessing PA pressure.   Pericardium: There is no evidence of pericardial effusion.   Mitral Valve: The mitral valve  is grossly normal. Trivial mitral valve  regurgitation.   Tricuspid Valve: The tricuspid valve is not well visualized. Tricuspid  valve regurgitation is not demonstrated.   Aortic Valve: The aortic valve was not well  visualized. Aortic valve  regurgitation is not visualized.   Pulmonic Valve: The pulmonic valve was not well visualized.   Aorta: The aortic root and ascending aorta are structurally normal, with  no evidence of dilitation for age and BSA.   Venous: The inferior vena cava is dilated in size with less than 50%  respiratory variability, suggesting right atrial pressure of 15 mmHg.  Patient Profile     50 y.o. 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 Thedore Mins and pt admitted with sepsis with large abscess of rt foot.   Assessment & Plan    1. Hx of NSTEMI: -Has known CAD however not felt to be a candidate for PCI or CABG with poor targets -Known poor follow up and medication non compliance  -Presented with c/o chest pain for two days however had not been taking his meds  -Not a candidate for further ischemic workup at this time  -Continue ASA, beta blocker and statin therapies  2. Cellulitis/sepsis/gangrene of the right foot/LE: -Initially treated with fluid resuscitation for sepsis>>ortho following now s/p transtibial BKA amputation with wound VAC placement  -Cardiology had concerns given significant cardiac hx and general anesthesia>>recs for block under conscious sedation if possible due to high risk -Treatment per IM/ortho   3. COVID +: -Treated per IM   4. NICM/NSVT: -Repeat echo with LVEF at 20-25% with global hypokinesis, no LV thrombus -On PTA Entresto 97/103, carvedilol and digoxin however Entresto and dig on hold due to AKI. Cleda Daub stopped due to hyperkalemia -NSVT with intermittent longer episodes on telemetry>>currently on metoprolol 12.5>>unable to up titrate due to soft BP -K+  4.0, Mg+ 2.3>>will give additional supplementation today  -Follow labs closely however VT likely in the setting of poor LV function  -Remains volume overloaded on exam>>Lasix discontinued this AM  -Not a current candidate for ICD due to current infection   5. Hx of CVA: -Noted in [place] in 2019  6. DM2: -SSI per IM   7. OSA: -Not on CPAP    8. AKI: -Nephrology following -[place] with no obstruction  -Following closely>>>if renal function continues to worsen, may need HD>>lasix discontinued this AM per primary  -Creatinine, 5.05 today>>up from 4.21 yesterday  -Baseline appears to be in the 1.1-1.2 range   Signed, Georgie Chard NP-C [facility] Pager: [phone] 01/26/2021, 8:10 AM     For questions or updates, please contact   Please consult [email or web address] for contact info under Cardiology/STEMI.

## 2021-01-27 ENCOUNTER — Encounter (HOSPITAL_COMMUNITY)
Admission: EM | Disposition: A | Discharge status: Rehabilitation Facility | Payer: Self-pay | Source: Home / Self Care | Attending: Internal Medicine

## 2021-01-27 DIAGNOSIS — I5022 Chronic systolic (congestive) heart failure: Secondary | ICD-10-CM | POA: Diagnosis not present

## 2021-01-27 DIAGNOSIS — L03115 Cellulitis of right lower limb: Secondary | ICD-10-CM | POA: Diagnosis not present

## 2021-01-27 DIAGNOSIS — U071 COVID-19: Secondary | ICD-10-CM | POA: Diagnosis not present

## 2021-01-27 HISTORY — PX: RIGHT HEART CATH: CATH118263

## 2021-01-27 LAB — POCT I-STAT EG7
Acid-Base Excess: 1 mmol/L (ref 0.0–2.0)
Acid-Base Excess: 2 mmol/L (ref 0.0–2.0)
Bicarbonate: 26.7 mmol/L (ref 20.0–28.0)
Bicarbonate: 27.2 mmol/L (ref 20.0–28.0)
Calcium, Ion: 1.03 mmol/L — ABNORMAL LOW (ref 1.15–1.40)
Calcium, Ion: 1.09 mmol/L — ABNORMAL LOW (ref 1.15–1.40)
HCT: 31 % — ABNORMAL LOW (ref 39.0–52.0)
HCT: 31 % — ABNORMAL LOW (ref 39.0–52.0)
Hemoglobin: 10.5 g/dL — ABNORMAL LOW (ref 13.0–17.0)
Hemoglobin: 10.5 g/dL — ABNORMAL LOW (ref 13.0–17.0)
O2 Saturation: 69 %
O2 Saturation: 70 %
Potassium: 3.4 mmol/L — ABNORMAL LOW (ref 3.5–5.1)
Potassium: 3.5 mmol/L (ref 3.5–5.1)
Sodium: 139 mmol/L (ref 135–145)
Sodium: 140 mmol/L (ref 135–145)
TCO2: 28 mmol/L (ref 22–32)
TCO2: 29 mmol/L (ref 22–32)
pCO2, Ven: 45.1 mmHg (ref 44.0–60.0)
pCO2, Ven: 46.1 mmHg (ref 44.0–60.0)
pH, Ven: 7.379 (ref 7.250–7.430)
pH, Ven: 7.381 (ref 7.250–7.430)
pO2, Ven: 37 mmHg (ref 32.0–45.0)
pO2, Ven: 38 mmHg (ref 32.0–45.0)

## 2021-01-27 LAB — CBC WITH DIFFERENTIAL/PLATELET
Abs Immature Granulocytes: 0.47 10*3/uL — ABNORMAL HIGH (ref 0.00–0.07)
Basophils Absolute: 0 10*3/uL (ref 0.0–0.1)
Basophils Relative: 0 %
Eosinophils Absolute: 0.1 10*3/uL (ref 0.0–0.5)
Eosinophils Relative: 0 %
HCT: 31.4 % — ABNORMAL LOW (ref 39.0–52.0)
Hemoglobin: 10.5 g/dL — ABNORMAL LOW (ref 13.0–17.0)
Immature Granulocytes: 3 %
Lymphocytes Relative: 13 %
Lymphs Abs: 2.1 10*3/uL (ref 0.7–4.0)
MCH: 30.2 pg (ref 26.0–34.0)
MCHC: 33.4 g/dL (ref 30.0–36.0)
MCV: 90.2 fL (ref 80.0–100.0)
Monocytes Absolute: 1.4 10*3/uL — ABNORMAL HIGH (ref 0.1–1.0)
Monocytes Relative: 8 %
Neutro Abs: 12.4 10*3/uL — ABNORMAL HIGH (ref 1.7–7.7)
Neutrophils Relative %: 76 %
Platelets: 274 10*3/uL (ref 150–400)
RBC: 3.48 MIL/uL — ABNORMAL LOW (ref 4.22–5.81)
RDW: 13.1 % (ref 11.5–15.5)
WBC: 16.4 10*3/uL — ABNORMAL HIGH (ref 4.0–10.5)
nRBC: 0 % (ref 0.0–0.2)

## 2021-01-27 LAB — GLUCOSE, CAPILLARY
Glucose-Capillary: 193 mg/dL — ABNORMAL HIGH (ref 70–99)
Glucose-Capillary: 75 mg/dL (ref 70–99)
Glucose-Capillary: 80 mg/dL (ref 70–99)
Glucose-Capillary: 81 mg/dL (ref 70–99)
Glucose-Capillary: 84 mg/dL (ref 70–99)
Glucose-Capillary: 87 mg/dL (ref 70–99)

## 2021-01-27 LAB — C-REACTIVE PROTEIN: CRP: 12.8 mg/dL — ABNORMAL HIGH (ref <1.0)

## 2021-01-27 LAB — PROCALCITONIN: Procalcitonin: 1.45 ng/mL

## 2021-01-27 LAB — CREATININE, SERUM
Creatinine, Ser: 5.52 mg/dL — ABNORMAL HIGH (ref 0.61–1.24)
GFR, Estimated: 12 mL/min — ABNORMAL LOW (ref >60)

## 2021-01-27 LAB — PHOSPHORUS: Phosphorus: 5.5 mg/dL — ABNORMAL HIGH (ref 2.5–4.6)

## 2021-01-27 LAB — COMPREHENSIVE METABOLIC PANEL
ALT: 15 U/L (ref 0–44)
AST: 34 U/L (ref 15–41)
Albumin: 2 g/dL — ABNORMAL LOW (ref 3.5–5.0)
Alkaline Phosphatase: 72 U/L (ref 38–126)
Anion gap: 12 (ref 5–15)
BUN: 53 mg/dL — ABNORMAL HIGH (ref 6–20)
CO2: 24 mmol/L (ref 22–32)
Calcium: 7.8 mg/dL — ABNORMAL LOW (ref 8.9–10.3)
Chloride: 99 mmol/L (ref 98–111)
Creatinine, Ser: 5.58 mg/dL — ABNORMAL HIGH (ref 0.61–1.24)
GFR, Estimated: 12 mL/min — ABNORMAL LOW (ref >60)
Glucose, Bld: 107 mg/dL — ABNORMAL HIGH (ref 70–99)
Potassium: 3.3 mmol/L — ABNORMAL LOW (ref 3.5–5.1)
Sodium: 135 mmol/L (ref 135–145)
Total Bilirubin: 0.4 mg/dL (ref 0.3–1.2)
Total Protein: 6.2 g/dL — ABNORMAL LOW (ref 6.5–8.1)

## 2021-01-27 LAB — MAGNESIUM: Magnesium: 2.6 mg/dL — ABNORMAL HIGH (ref 1.7–2.4)

## 2021-01-27 LAB — TYPE AND SCREEN
ABO/RH(D): O POS
Antibody Screen: NEGATIVE

## 2021-01-27 LAB — SURGICAL PATHOLOGY

## 2021-01-27 LAB — BRAIN NATRIURETIC PEPTIDE: B Natriuretic Peptide: 326.8 pg/mL — ABNORMAL HIGH (ref 0.0–100.0)

## 2021-01-27 SURGERY — RIGHT HEART CATH
Anesthesia: LOCAL

## 2021-01-27 MED ORDER — ASCORBIC ACID 500 MG PO TABS
250.0000 mg | ORAL_TABLET | Freq: Every day | ORAL | Status: DC
  Administered 2021-01-28 – 2021-02-07 (×11): 250 mg via ORAL
  Filled 2021-01-27 (×11): qty 1

## 2021-01-27 MED ORDER — LIDOCAINE HCL (PF) 1 % IJ SOLN
INTRAMUSCULAR | Status: DC | PRN
  Administered 2021-01-27: 10 mL

## 2021-01-27 MED ORDER — SODIUM CHLORIDE 0.9% FLUSH
3.0000 mL | Freq: Two times a day (BID) | INTRAVENOUS | Status: DC
  Administered 2021-01-27 – 2021-02-01 (×11): 3 mL via INTRAVENOUS

## 2021-01-27 MED ORDER — LIDOCAINE HCL (PF) 1 % IJ SOLN
INTRAMUSCULAR | Status: AC
  Filled 2021-01-27: qty 30

## 2021-01-27 MED ORDER — HYDRALAZINE HCL 20 MG/ML IJ SOLN: 10.0000 mg | INTRAMUSCULAR | Status: AC | PRN

## 2021-01-27 MED ORDER — SODIUM CHLORIDE 0.9 % IV SOLN: 250.0000 mL | INTRAVENOUS | Status: DC | PRN

## 2021-01-27 MED ORDER — HEPARIN (PORCINE) IN NACL 1000-0.9 UT/500ML-% IV SOLN
INTRAVENOUS | Status: DC | PRN
  Administered 2021-01-27: 500 mL

## 2021-01-27 MED ORDER — ADENOSINE 6 MG/2ML IV SOLN
INTRAVENOUS | Status: DC | PRN
  Administered 2021-01-27: 6 mg via INTRAVENOUS

## 2021-01-27 MED ORDER — POTASSIUM CHLORIDE CRYS ER 20 MEQ PO TBCR
20.0000 meq | EXTENDED_RELEASE_TABLET | Freq: Once | ORAL | Status: AC
  Administered 2021-01-27: 20 meq via ORAL
  Filled 2021-01-27: qty 1

## 2021-01-27 MED ORDER — HEPARIN SODIUM (PORCINE) 10000 UNIT/ML IJ SOLN
7500.0000 [IU] | Freq: Three times a day (TID) | INTRAMUSCULAR | Status: DC
  Administered 2021-01-27 – 2021-02-07 (×30): 7500 [IU] via SUBCUTANEOUS
  Filled 2021-01-27 (×36): qty 1

## 2021-01-27 MED ORDER — HEPARIN (PORCINE) IN NACL 1000-0.9 UT/500ML-% IV SOLN
INTRAVENOUS | Status: AC
  Filled 2021-01-27: qty 500

## 2021-01-27 MED ORDER — METOPROLOL SUCCINATE ER 25 MG PO TB24
25.0000 mg | ORAL_TABLET | Freq: Every day | ORAL | Status: DC
  Administered 2021-01-28: 25 mg via ORAL
  Filled 2021-01-27: qty 1

## 2021-01-27 MED ORDER — SODIUM CHLORIDE 0.9% FLUSH: 3.0000 mL | INTRAVENOUS | Status: DC | PRN

## 2021-01-27 SURGICAL SUPPLY — 12 items
CATH SWAN GANZ 7F STRAIGHT (CATHETERS) ×2 IMPLANT
ELECT DEFIB PAD ADLT CADENCE (PAD) ×2 IMPLANT
KIT MICROPUNCTURE NIT STIFF (SHEATH) ×2 IMPLANT
MAT PREVALON FULL STRYKER (MISCELLANEOUS) ×2 IMPLANT
PACK CARDIAC CATHETERIZATION (CUSTOM PROCEDURE TRAY) ×2 IMPLANT
PROTECTION STATION PRESSURIZED (MISCELLANEOUS) ×2
SHEATH PINNACLE 7F 10CM (SHEATH) ×2 IMPLANT
SHEATH PROBE COVER 6X72 (BAG) ×2 IMPLANT
STATION PROTECTION PRESSURIZED (MISCELLANEOUS) ×1 IMPLANT
TRANSDUCER W/STOPCOCK (MISCELLANEOUS) ×2 IMPLANT
TUBING ART PRESS 72  MALE/FEM (TUBING) ×2
TUBING ART PRESS 72 MALE/FEM (TUBING) ×1 IMPLANT

## 2021-01-27 NOTE — Progress Notes (Signed)
Patient ID: Mitchell [name], male   DOB: Dec 31, 1970, 50 y.o.   MRN: [id number] Patient is postoperative day 1 below the knee amputation.  There is a good suction seal without drainage.  Discussed that patient needs to have the wound VAC dressing and the stump shrinker in place for 1 week.  Patient has been seen by [facility] and provided education.  Patient is confused that he has seen multiple providers and is confused regarding cardiology plans.  I was unable to reach his wife yesterday.

## 2021-01-27 NOTE — H&P (View-Only) (Signed)
Progress Note  Patient Name: Mitchell [name] Date of Encounter: 01/27/2021  [facility] Cardiologist: Arvilla Meres, MD   Subjective   Feeling much better.  Denies chest pain or shortness of breath.  Confused about why he hasn't been receiving torsemide.  Inpatient Medications    Scheduled Meds:  vitamin C  1,000 mg Oral Daily   aspirin EC  81 mg Oral Daily   heparin injection (subcutaneous)  7,500 Units Subcutaneous Q8H   insulin aspart  0-9 Units Subcutaneous Q4H   insulin glargine  20 Units Subcutaneous BID   metoprolol tartrate  12.5 mg Oral BID   multivitamin with minerals  1 tablet Oral Daily   nutrition supplement (JUVEN)  1 packet Oral BID BM   pantoprazole  40 mg Oral Daily   potassium chloride  40 mEq Oral Once   Ensure Max Protein  11 oz Oral QHS   rosuvastatin  20 mg Oral Daily   sodium chloride flush  3 mL Intravenous Q12H   zinc sulfate  220 mg Oral Daily   Continuous Infusions:  sodium chloride     sodium chloride 10 mL/hr at 01/27/21 0540    ceFAZolin (ANCEF) IV     PRN Meds: sodium chloride, acetaminophen **OR** acetaminophen, alum & mag hydroxide-simeth, guaiFENesin-dextromethorphan, HYDROcodone-acetaminophen, HYDROmorphone (DILAUDID) injection, loperamide, ondansetron (ZOFRAN) IV, oxyCODONE, potassium chloride, sodium chloride flush   Vital Signs    Vitals:   01/26/21 2126 01/26/21 2353 01/27/21 0428 01/27/21 0753  BP: 117/68 140/85 99/63 138/83  Pulse: 83 86 82 84  Resp:  20 19 15   Temp:  98 F (36.7 C) 98.3 F (36.8 C) 98.5 F (36.9 C)  TempSrc:  Axillary Axillary Axillary  SpO2:  93% 90% 100%  Weight:      Height:        Intake/Output Summary (Last 24 hours) at 01/27/2021 0950 Last data filed at 01/27/2021 01/29/2021 Gross per 24 hour  Intake 480 ml  Output 750 ml  Net -270 ml   Last 3 Weights 01/26/2021 01/24/2021 01/23/2021  Weight (lbs) 304 lb 10.8 oz 293 lb 6.9 oz 302 lb 0.5 oz  Weight (kg) 138.2 kg 133.1 kg 137 kg       Telemetry    Sinus rhythm.  Up to 11 beat runs of VT - Personally Reviewed  ECG    N/a - Personally Reviewed  Physical Exam   VS:  BP 138/83 (BP Location: Right Arm)   Pulse 84   Temp 98.5 F (36.9 C) (Axillary)   Resp 15   Ht 6' (1.829 m)   Wt (!) 138.2 kg   SpO2 100%   BMI 41.32 kg/m  , BMI Body mass index is 41.32 kg/m. GENERAL:  Well appearing HEENT: Pupils equal round and reactive, fundi not visualized, oral mucosa unremarkable NECK:  No jugular venous distention, waveform within normal limits, carotid upstroke brisk and symmetric, no bruits LUNGS:  Clear to auscultation bilaterally HEART:  RRR.  PMI not displaced or sustained,S1 and S2 within normal limits, no S3, no S4, no clicks, no rubs, no murmurs ABD:  Flat, positive bowel sounds normal in frequency in pitch, no bruits, no rebound, no guarding, no midline pulsatile mass, no hepatomegaly, no splenomegaly EXT:  R BKA.  No L LE edema. SKIN:  No rashes no nodules NEURO:  Cranial nerves II through XII grossly intact, motor grossly intact throughout PSYCH:  Cognitively intact, oriented to person place and time   Labs    High  Sensitivity Troponin:   Recent Labs  Lab 01/23/21 1729 01/23/21 2136 01/24/21 0230 01/24/21 0530  TROPONINIHS 399* 489* 493* 534*      Chemistry Recent Labs  Lab 01/25/21 0105 01/26/21 0225 01/27/21 0059  NA 136 133* 135  K 3.2* 4.0 3.3*  CL 100 99 99  CO2 24 23 24   GLUCOSE 85 266* 107*  BUN 38* 46* 53*  CREATININE 4.21* 5.05* 5.58*  CALCIUM 8.0* 7.8* 7.8*  PROT 6.7 6.4* 6.2*  ALBUMIN 2.3* 2.0* 2.0*  AST 29 26 34  ALT 16 16 15   ALKPHOS 71 63 72  BILITOT 0.8 0.5 0.4  GFRNONAA 16* 13* 12*  ANIONGAP 12 11 12      Hematology Recent Labs  Lab 01/25/21 0105 01/26/21 0225 01/27/21 0059  WBC 21.1* 20.8* 16.4*  RBC 3.83* 3.75* 3.48*  HGB 11.4* 11.1* 10.5*  HCT 35.5* 34.1* 31.4*  MCV 92.7 90.9 90.2  MCH 29.8 29.6 30.2  MCHC 32.1 32.6 33.4  RDW 13.1 13.0 13.1  PLT  205 270 274    BNP Recent Labs  Lab 01/25/21 0105 01/26/21 0225 01/27/21 0059  BNP 404.3* 503.1* 326.8*     DDimer  Recent Labs  Lab 01/23/21 2145 01/24/21 0230  DDIMER 2.80* 3.07*     Radiology    DG CHEST PORT 1 VIEW  Result Date: 01/26/2021 CLINICAL DATA:  Shortness of breath.  COVID. EXAM: PORTABLE CHEST 1 VIEW COMPARISON:  Chest x-ray 01/24/2021. FINDINGS: Cardiomegaly. No pulmonary venous congestion. Low lung volumes with mild bibasilar atelectasis. Minimal infiltrate right lung base again cannot be excluded. No pleural effusion or pneumothorax. IMPRESSION: 1.  Cardiomegaly.  No pulmonary venous congestion. 2. Low lung volumes with mild bibasilar atelectasis again noted. Minimal infiltrate in the right lung base again cannot be excluded. Chest is unchanged from prior exam. Electronically Signed   By: 01/26/21  Register   On: 01/26/2021 15:15    Cardiac Studies   Echo 01/24/21: 1. Left ventricular ejection fraction, by estimation, is 20 to 25%. The  left ventricle has severely decreased function. The left ventricle  demonstrates global hypokinesis. There is mild concentric left ventricular  hypertrophy. Indeterminate diastolic  filling due to E-A fusion.      No evidence of LV thrombus.   2. The mitral valve is grossly normal. Trivial mitral valve  regurgitation.   3. The aortic valve was not well visualized. Aortic valve regurgitation  is not visualized.   4. Tricuspid regurgitation signal is inadequate for assessing PA  pressure.   5. The inferior vena cava is dilated in size with <50% respiratory  variability, suggesting right atrial pressure of 15 mmHg.  Patient Profile     Mitchell [name] is a 57M with chronic systolic and diastolic heart failure (LVEF 20-25%), obstructive CAD not amenable to revascularization, diabetes (A1c 8.1%) hypertension, OSA, Bipolar disorder, schizophrenia, CVA and obesity admitted with sepsis from R LE abscess/cellulitis and COVID-19    Assessment & Plan    # Sepsis: # R LE abscess/cellulitis: Now more hemodynamically stable.  S/p R BKA.   He remains on IV antibiotics per the primary team.  #Acute on chronic systolic diastolic heart failure: LVEF this admission was 20 to 25%.  Initially he appeared to be volume overloaded and had not been on his torsemide for a week prior to admission.  He required volume resuscitation due to hypotension and AKI.  His renal function worsened and he was given a dose of diuretics, however his renal function continued  to worsen.  His volume status has been difficult to assess.  JVP was initially elevated.  It needs seems to be lower now.  BNP peaked at 503 and is now 326 today.  IVC was dilated with a residual pressure of 15 on his echo 6/27.  We will get a right heart cath today to better assess his volume status.  His home torsemide and Entresto remain on hold.  Carvedilol was held and he is now on metoprolol.  Will consolidate to succinate.  He has had a lot of NSVT.  Continue metoprolol for now.  Maintain K greater than 4, mag greater than 2.  Avoid QT prolonging agents.  # Medically managed CAD:  Continue aspirin, metoprolol and rosuvastatin.   # Acute renal failure:  Renal function continues to worsen.  His urine is starting to look more clear today.  Likely intravascular hypotension as above.  Awaiting RHC results to better determine management.        For questions or updates, please contact [facility] Please consult [email or web address] for contact info under        Signed, Chilton Si, MD  01/27/2021, 9:50 AM
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
## 2021-01-27 NOTE — Progress Notes (Signed)
Progress Note  Patient Name: Mitchell [name] Date of Encounter: 01/27/2021  [facility] Cardiologist: Arvilla Meres, MD   Subjective   Feeling much better.  Denies chest pain or shortness of breath.  Confused about why he hasn't been receiving torsemide.  Inpatient Medications    Scheduled Meds:  vitamin C  1,000 mg Oral Daily   aspirin EC  81 mg Oral Daily   heparin injection (subcutaneous)  7,500 Units Subcutaneous Q8H   insulin aspart  0-9 Units Subcutaneous Q4H   insulin glargine  20 Units Subcutaneous BID   metoprolol tartrate  12.5 mg Oral BID   multivitamin with minerals  1 tablet Oral Daily   nutrition supplement (JUVEN)  1 packet Oral BID BM   pantoprazole  40 mg Oral Daily   potassium chloride  40 mEq Oral Once   Ensure Max Protein  11 oz Oral QHS   rosuvastatin  20 mg Oral Daily   sodium chloride flush  3 mL Intravenous Q12H   zinc sulfate  220 mg Oral Daily   Continuous Infusions:  sodium chloride     sodium chloride 10 mL/hr at 01/27/21 0540    ceFAZolin (ANCEF) IV     PRN Meds: sodium chloride, acetaminophen **OR** acetaminophen, alum & mag hydroxide-simeth, guaiFENesin-dextromethorphan, HYDROcodone-acetaminophen, HYDROmorphone (DILAUDID) injection, loperamide, ondansetron (ZOFRAN) IV, oxyCODONE, potassium chloride, sodium chloride flush   Vital Signs    Vitals:   01/26/21 2126 01/26/21 2353 01/27/21 0428 01/27/21 0753  BP: 117/68 140/85 99/63 138/83  Pulse: 83 86 82 84  Resp:  20 19 15   Temp:  98 F (36.7 C) 98.3 F (36.8 C) 98.5 F (36.9 C)  TempSrc:  Axillary Axillary Axillary  SpO2:  93% 90% 100%  Weight:      Height:        Intake/Output Summary (Last 24 hours) at 01/27/2021 0950 Last data filed at 01/27/2021 01/29/2021 Gross per 24 hour  Intake 480 ml  Output 750 ml  Net -270 ml   Last 3 Weights 01/26/2021 01/24/2021 01/23/2021  Weight (lbs) 304 lb 10.8 oz 293 lb 6.9 oz 302 lb 0.5 oz  Weight (kg) 138.2 kg 133.1 kg 137 kg       Telemetry    Sinus rhythm.  Up to 11 beat runs of VT - Personally Reviewed  ECG    N/a - Personally Reviewed  Physical Exam   VS:  BP 138/83 (BP Location: Right Arm)   Pulse 84   Temp 98.5 F (36.9 C) (Axillary)   Resp 15   Ht 6' (1.829 m)   Wt (!) 138.2 kg   SpO2 100%   BMI 41.32 kg/m  , BMI Body mass index is 41.32 kg/m. GENERAL:  Well appearing HEENT: Pupils equal round and reactive, fundi not visualized, oral mucosa unremarkable NECK:  No jugular venous distention, waveform within normal limits, carotid upstroke brisk and symmetric, no bruits LUNGS:  Clear to auscultation bilaterally HEART:  RRR.  PMI not displaced or sustained,S1 and S2 within normal limits, no S3, no S4, no clicks, no rubs, no murmurs ABD:  Flat, positive bowel sounds normal in frequency in pitch, no bruits, no rebound, no guarding, no midline pulsatile mass, no hepatomegaly, no splenomegaly EXT:  R BKA.  No L LE edema. SKIN:  No rashes no nodules NEURO:  Cranial nerves II through XII grossly intact, motor grossly intact throughout PSYCH:  Cognitively intact, oriented to person place and time   Labs    High  Sensitivity Troponin:   Recent Labs  Lab 01/23/21 1729 01/23/21 2136 01/24/21 0230 01/24/21 0530  TROPONINIHS 399* 489* 493* 534*      Chemistry Recent Labs  Lab 01/25/21 0105 01/26/21 0225 01/27/21 0059  NA 136 133* 135  K 3.2* 4.0 3.3*  CL 100 99 99  CO2 24 23 24   GLUCOSE 85 266* 107*  BUN 38* 46* 53*  CREATININE 4.21* 5.05* 5.58*  CALCIUM 8.0* 7.8* 7.8*  PROT 6.7 6.4* 6.2*  ALBUMIN 2.3* 2.0* 2.0*  AST 29 26 34  ALT 16 16 15   ALKPHOS 71 63 72  BILITOT 0.8 0.5 0.4  GFRNONAA 16* 13* 12*  ANIONGAP 12 11 12      Hematology Recent Labs  Lab 01/25/21 0105 01/26/21 0225 01/27/21 0059  WBC 21.1* 20.8* 16.4*  RBC 3.83* 3.75* 3.48*  HGB 11.4* 11.1* 10.5*  HCT 35.5* 34.1* 31.4*  MCV 92.7 90.9 90.2  MCH 29.8 29.6 30.2  MCHC 32.1 32.6 33.4  RDW 13.1 13.0 13.1  PLT  205 270 274    BNP Recent Labs  Lab 01/25/21 0105 01/26/21 0225 01/27/21 0059  BNP 404.3* 503.1* 326.8*     DDimer  Recent Labs  Lab 01/23/21 2145 01/24/21 0230  DDIMER 2.80* 3.07*     Radiology    DG CHEST PORT 1 VIEW  Result Date: 01/26/2021 CLINICAL DATA:  Shortness of breath.  COVID. EXAM: PORTABLE CHEST 1 VIEW COMPARISON:  Chest x-ray 01/24/2021. FINDINGS: Cardiomegaly. No pulmonary venous congestion. Low lung volumes with mild bibasilar atelectasis. Minimal infiltrate right lung base again cannot be excluded. No pleural effusion or pneumothorax. IMPRESSION: 1.  Cardiomegaly.  No pulmonary venous congestion. 2. Low lung volumes with mild bibasilar atelectasis again noted. Minimal infiltrate in the right lung base again cannot be excluded. Chest is unchanged from prior exam. Electronically Signed   By: 01/26/21  Register   On: 01/26/2021 15:15    Cardiac Studies   Echo 01/24/21: 1. Left ventricular ejection fraction, by estimation, is 20 to 25%. The  left ventricle has severely decreased function. The left ventricle  demonstrates global hypokinesis. There is mild concentric left ventricular  hypertrophy. Indeterminate diastolic  filling due to E-A fusion.      No evidence of LV thrombus.   2. The mitral valve is grossly normal. Trivial mitral valve  regurgitation.   3. The aortic valve was not well visualized. Aortic valve regurgitation  is not visualized.   4. Tricuspid regurgitation signal is inadequate for assessing PA  pressure.   5. The inferior vena cava is dilated in size with <50% respiratory  variability, suggesting right atrial pressure of 15 mmHg.  Patient Profile     Mr. Mairena is a 57M with chronic systolic and diastolic heart failure (LVEF 20-25%), obstructive CAD not amenable to revascularization, diabetes (A1c 8.1%) hypertension, OSA, Bipolar disorder, schizophrenia, CVA and obesity admitted with sepsis from R LE abscess/cellulitis and COVID-19    Assessment & Plan    # Sepsis: # R LE abscess/cellulitis: Now more hemodynamically stable.  S/p R BKA.   He remains on IV antibiotics per the primary team.  #Acute on chronic systolic diastolic heart failure: LVEF this admission was 20 to 25%.  Initially he appeared to be volume overloaded and had not been on his torsemide for a week prior to admission.  He required volume resuscitation due to hypotension and AKI.  His renal function worsened and he was given a dose of diuretics, however his renal function continued  to worsen.  His volume status has been difficult to assess.  JVP was initially elevated.  It needs seems to be lower now.  BNP peaked at 503 and is now 326 today.  IVC was dilated with a residual pressure of 15 on his echo 6/27.  We will get a right heart cath today to better assess his volume status.  His home torsemide and Entresto remain on hold.  Carvedilol was held and he is now on metoprolol.  Will consolidate to succinate.  He has had a lot of NSVT.  Continue metoprolol for now.  Maintain K greater than 4, mag greater than 2.  Avoid QT prolonging agents.  # Medically managed CAD:  Continue aspirin, metoprolol and rosuvastatin.   # Acute renal failure:  Renal function continues to worsen.  His urine is starting to look more clear today.  Likely intravascular hypotension as above.  Awaiting RHC results to better determine management.        For questions or updates, please contact [facility] Please consult [email or web address] for contact info under        Signed, Chilton Si, MD  01/27/2021, 9:50 AM
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
## 2021-01-27 NOTE — Progress Notes (Signed)
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 RVS  Examination Guidelines: A complete evaluation includes B-mode imaging, spectral Doppler, color Doppler, and power Doppler as needed of all accessible portions of each vessel. Bilateral testing is considered an integral part of a complete examination. Limited examinations for reoccurring indications may be performed as noted. The reflux portion of the exam is performed with the patient in reverse Trendelenburg.  +---------+---------------+---------+-----------+----------+-------------------+ RIGHT     CompressibilityPhasicitySpontaneityPropertiesThrombus Aging      +---------+---------------+---------+-----------+----------+-------------------+ CFV      Full                                         pulsatile flow      +---------+---------------+---------+-----------+----------+-------------------+ SFJ      Full                                                             +---------+---------------+---------+-----------+----------+-------------------+ FV Prox  Full                                                             +---------+---------------+---------+-----------+----------+-------------------+ FV Mid   Full                                                             +---------+---------------+---------+-----------+----------+-------------------+ FV DistalFull                                                             +---------+---------------+---------+-----------+----------+-------------------+ PFV      Full                                                             +---------+---------------+---------+-----------+----------+-------------------+  POP      Full                                         pulsatile           +---------+---------------+---------+-----------+----------+-------------------+ PTV      Full                                                             +---------+---------------+---------+-----------+----------+-------------------+ PERO                                                  Not well visualized +---------+---------------+---------+-----------+----------+-------------------+ Dopplered distal posterior tibial and anterior tibial arteries which demonstrate perfusion to the foot.  +----+---------------+---------+-----------+----------+--------------+ LEFTCompressibilityPhasicitySpontaneityPropertiesThrombus Aging +----+---------------+---------+-----------+----------+--------------+ CFV Full                                          pulsatile      +----+---------------+---------+-----------+----------+--------------+    Summary: RIGHT: - There is no evidence of deep vein thrombosis in the lower extremity. However, portions of this examination were limited- see technologist comments above.  - Ultrasound characteristics of enlarged lymph nodes are noted in the groin.  LEFT: - No evidence of common femoral vein obstruction.  *See table(s) above for measurements and observations. Electronically signed by Fabienne Bruns MD on 01/24/2021 at 7:06:52 PM.    Final    ECHOCARDIOGRAM LIMITED  Result Date: 01/24/2021    ECHOCARDIOGRAM LIMITED REPORT   Patient Name:   HERMINIO KNISKERN Date of Exam: 01/24/2021 Medical Rec #:  [id number]       Height:       71.5 in Accession #:    [id number]      Weight:       302.0 lb Date of Birth:  January 27, 1971        BSA:          2.525 m Patient Age:    50 years        BP:           105/68 mmHg Patient Gender: M               HR:           96 bpm. Exam Location:  Inpatient Procedure: Limited Echo, Cardiac Doppler, Color Doppler and Intracardiac            Opacification Agent Indications:    R07.9* Chest pain, unspecified. Elevated troponin.  History:        Patient has prior history of Echocardiogram examinations. CHF,                 Abnormal ECG, Stroke, Signs/Symptoms:Chest Pain and Bacteremia;                 Risk Factors:Sleep Apnea, Hypertension and Diabetes.  Sonographer:    Sheralyn Boatman RDCS Referring Phys: [id number] ANASTASSIA DOUTOVA  Sonographer Comments: Technically difficult study due to poor echo windows and patient is morbidly  obese. Image acquisition challenging due to patient body habitus. Covid positive. IMPRESSIONS  1. Left ventricular ejection fraction, by estimation, is 20 to 25%. The left ventricle has severely decreased function. The left ventricle demonstrates global hypokinesis. There is mild concentric left ventricular hypertrophy. Indeterminate diastolic filling due to  E-A fusion.     No evidence of LV thrombus.  2. The mitral valve is grossly normal. Trivial mitral valve regurgitation.  3. The aortic valve was not well visualized. Aortic valve regurgitation is not visualized.  4. Tricuspid regurgitation signal is inadequate for assessing PA pressure.  5. The inferior vena cava is dilated in size with <50% respiratory variability, suggesting right atrial pressure of 15 mmHg. Comparison(s): A prior study was performed on 07/30/2019. Prior images reviewed side by side. EF is still significantly depressed. FINDINGS  Left Ventricle: No evidence of LV thrombus. Left ventricular ejection fraction, by estimation, is 20 to 25%. The left ventricle has severely decreased function. The left ventricle demonstrates global hypokinesis. Definity contrast agent was given IV to delineate the left ventricular endocardial borders. There is mild concentric left ventricular hypertrophy. Indeterminate diastolic filling due to E-A fusion. Right Ventricle: Tricuspid regurgitation signal is inadequate for assessing PA pressure. Pericardium: There is no evidence of pericardial effusion. Mitral Valve: The mitral valve is grossly normal. Trivial mitral valve regurgitation. Tricuspid Valve: The tricuspid valve is not well visualized. Tricuspid valve regurgitation is not demonstrated. Aortic Valve: The aortic valve was not well visualized. Aortic valve regurgitation is not visualized. Pulmonic Valve: The pulmonic valve was not well visualized. Aorta: The aortic root and ascending aorta are structurally normal, with no evidence of dilitation for age and BSA. Venous: The inferior vena cava is dilated in size with less than 50% respiratory variability, suggesting right atrial pressure of 15 mmHg. LEFT VENTRICLE PLAX 2D LVIDd:         6.80 cm LVIDs:         5.90 cm LV PW:         1.30 cm LV IVS:        1.30 cm LVOT diam:     2.20 cm LVOT Area:     3.80 cm  IVC IVC diam: 2.90 cm LEFT ATRIUM         Index LA diam:     4.90 cm 1.94 cm/m   AORTA Ao Root diam: 3.80 cm Ao Asc diam:  3.80 cm MITRAL VALVE MV Area (PHT): 5.54 cm    SHUNTS MV Decel Time: 137 msec    Systemic Diam: 2.20 cm MV E velocity: 83.60 cm/s MV A velocity: 52.30 cm/s MV E/A ratio:  1.60 Riley Lam MD Electronically signed by Riley Lam MD Signature Date/Time: 01/24/2021/12:59:43 PM    Final

## 2021-01-27 NOTE — Progress Notes (Signed)
Ok to increase SQ heparin to 7500 units TID due to BMI 39 per Dr. Randol Kern.  Ulyses Southward, PharmD, BCIDP, AAHIVP, CPP Infectious Disease Pharmacist 01/27/2021 9:19 AM

## 2021-01-27 NOTE — Interval H&P Note (Signed)
History and Physical Interval Note:  01/27/2021 5:07 PM  Mitchell [name]  has presented today for surgery, with the diagnosis of heart failure.  The various methods of treatment have been discussed with the patient and family. After consideration of risks, benefits and other options for treatment, the patient has consented to  Procedure(s): RIGHT HEART CATH (N/A) as a surgical intervention.  The patient's history has been reviewed, patient examined, no change in status, stable for surgery.  I have reviewed the patient's chart and labs.  Questions were answered to the patient's satisfaction.     Satvik Parco

## 2021-01-28 ENCOUNTER — Encounter (HOSPITAL_COMMUNITY): Payer: Self-pay | Admitting: Internal Medicine

## 2021-01-28 ENCOUNTER — Inpatient Hospital Stay (HOSPITAL_COMMUNITY): Payer: No Typology Code available for payment source

## 2021-01-28 DIAGNOSIS — N179 Acute kidney failure, unspecified: Secondary | ICD-10-CM | POA: Diagnosis not present

## 2021-01-28 DIAGNOSIS — R55 Syncope and collapse: Secondary | ICD-10-CM

## 2021-01-28 DIAGNOSIS — L03115 Cellulitis of right lower limb: Secondary | ICD-10-CM

## 2021-01-28 DIAGNOSIS — I5043 Acute on chronic combined systolic (congestive) and diastolic (congestive) heart failure: Secondary | ICD-10-CM | POA: Diagnosis not present

## 2021-01-28 LAB — COMPREHENSIVE METABOLIC PANEL
ALT: 19 U/L (ref 0–44)
AST: 41 U/L (ref 15–41)
Albumin: 1.9 g/dL — ABNORMAL LOW (ref 3.5–5.0)
Alkaline Phosphatase: 74 U/L (ref 38–126)
Anion gap: 8 (ref 5–15)
BUN: 52 mg/dL — ABNORMAL HIGH (ref 6–20)
CO2: 26 mmol/L (ref 22–32)
Calcium: 7.6 mg/dL — ABNORMAL LOW (ref 8.9–10.3)
Chloride: 101 mmol/L (ref 98–111)
Creatinine, Ser: 5.47 mg/dL — ABNORMAL HIGH (ref 0.61–1.24)
GFR, Estimated: 12 mL/min — ABNORMAL LOW (ref >60)
Glucose, Bld: 174 mg/dL — ABNORMAL HIGH (ref 70–99)
Potassium: 3.6 mmol/L (ref 3.5–5.1)
Sodium: 135 mmol/L (ref 135–145)
Total Bilirubin: 0.5 mg/dL (ref 0.3–1.2)
Total Protein: 6.3 g/dL — ABNORMAL LOW (ref 6.5–8.1)

## 2021-01-28 LAB — CBC WITH DIFFERENTIAL/PLATELET
Abs Immature Granulocytes: 0.51 10*3/uL — ABNORMAL HIGH (ref 0.00–0.07)
Basophils Absolute: 0 10*3/uL (ref 0.0–0.1)
Basophils Relative: 0 %
Eosinophils Absolute: 0.1 10*3/uL (ref 0.0–0.5)
Eosinophils Relative: 1 %
HCT: 31.7 % — ABNORMAL LOW (ref 39.0–52.0)
Hemoglobin: 10.4 g/dL — ABNORMAL LOW (ref 13.0–17.0)
Immature Granulocytes: 4 %
Lymphocytes Relative: 11 %
Lymphs Abs: 1.4 10*3/uL (ref 0.7–4.0)
MCH: 29.7 pg (ref 26.0–34.0)
MCHC: 32.8 g/dL (ref 30.0–36.0)
MCV: 90.6 fL (ref 80.0–100.0)
Monocytes Absolute: 1.2 10*3/uL — ABNORMAL HIGH (ref 0.1–1.0)
Monocytes Relative: 9 %
Neutro Abs: 9.8 10*3/uL — ABNORMAL HIGH (ref 1.7–7.7)
Neutrophils Relative %: 75 %
Platelets: 302 10*3/uL (ref 150–400)
RBC: 3.5 MIL/uL — ABNORMAL LOW (ref 4.22–5.81)
RDW: 13.1 % (ref 11.5–15.5)
WBC: 13 10*3/uL — ABNORMAL HIGH (ref 4.0–10.5)
nRBC: 0 % (ref 0.0–0.2)

## 2021-01-28 LAB — MAGNESIUM: Magnesium: 2.6 mg/dL — ABNORMAL HIGH (ref 1.7–2.4)

## 2021-01-28 LAB — PHOSPHORUS: Phosphorus: 5.1 mg/dL — ABNORMAL HIGH (ref 2.5–4.6)

## 2021-01-28 LAB — C-REACTIVE PROTEIN: CRP: 10.3 mg/dL — ABNORMAL HIGH (ref <1.0)

## 2021-01-28 LAB — GLUCOSE, CAPILLARY
Glucose-Capillary: 109 mg/dL — ABNORMAL HIGH (ref 70–99)
Glucose-Capillary: 130 mg/dL — ABNORMAL HIGH (ref 70–99)
Glucose-Capillary: 131 mg/dL — ABNORMAL HIGH (ref 70–99)
Glucose-Capillary: 136 mg/dL — ABNORMAL HIGH (ref 70–99)
Glucose-Capillary: 141 mg/dL — ABNORMAL HIGH (ref 70–99)
Glucose-Capillary: 179 mg/dL — ABNORMAL HIGH (ref 70–99)
Glucose-Capillary: 209 mg/dL — ABNORMAL HIGH (ref 70–99)

## 2021-01-28 LAB — PROCALCITONIN: Procalcitonin: 0.65 ng/mL

## 2021-01-28 LAB — CULTURE, BLOOD (ROUTINE X 2): Culture: NO GROWTH

## 2021-01-28 LAB — BRAIN NATRIURETIC PEPTIDE: B Natriuretic Peptide: 659.5 pg/mL — ABNORMAL HIGH (ref 0.0–100.0)

## 2021-01-28 MED ORDER — METOPROLOL SUCCINATE ER 50 MG PO TB24
50.0000 mg | ORAL_TABLET | Freq: Every day | ORAL | Status: DC
  Administered 2021-01-29 – 2021-01-30 (×2): 50 mg via ORAL
  Filled 2021-01-28 (×2): qty 1

## 2021-01-28 MED ORDER — PHENOL 1.4 % MT LIQD
1.0000 | OROMUCOSAL | Status: DC | PRN
  Administered 2021-01-28 (×2): 1 via OROMUCOSAL
  Filled 2021-01-28: qty 177

## 2021-01-28 MED ORDER — SODIUM CHLORIDE 0.9 % IV SOLN: INTRAVENOUS | Status: DC
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
## 2021-01-28 NOTE — Progress Notes (Signed)
Progress Note  Patient Name: Mitchell [name] Date of Encounter: 01/28/2021  [facility] Cardiologist: Arvilla Meres, MD   Subjective   Feeling much better.  Denies chest pain or shortness of breath.  Concerned about frequent bowel movements.  Inpatient Medications    Scheduled Meds:  vitamin C  250 mg Oral Daily   aspirin EC  81 mg Oral Daily   heparin injection (subcutaneous)  7,500 Units Subcutaneous Q8H   insulin aspart  0-9 Units Subcutaneous Q4H   insulin glargine  20 Units Subcutaneous BID   metoprolol succinate  25 mg Oral Daily   multivitamin with minerals  1 tablet Oral Daily   nutrition supplement (JUVEN)  1 packet Oral BID BM   pantoprazole  40 mg Oral Daily   potassium chloride  40 mEq Oral Once   Ensure Max Protein  11 oz Oral QHS   rosuvastatin  20 mg Oral Daily   sodium chloride flush  3 mL Intravenous Q12H   sodium chloride flush  3 mL Intravenous Q12H   zinc sulfate  220 mg Oral Daily   Continuous Infusions:  sodium chloride     PRN Meds: sodium chloride, acetaminophen **OR** acetaminophen, alum & mag hydroxide-simeth, guaiFENesin-dextromethorphan, HYDROcodone-acetaminophen, HYDROmorphone (DILAUDID) injection, loperamide, ondansetron (ZOFRAN) IV, oxyCODONE, potassium chloride, sodium chloride flush   Vital Signs    Vitals:   01/27/21 2003 01/27/21 2330 01/28/21 0450 01/28/21 0820  BP: (!) 142/84 114/85 116/61 126/72  Pulse: 84 84 78   Resp: 19 18 19    Temp: 98.4 F (36.9 C) 98.6 F (37 C) 98.6 F (37 C) 98.7 F (37.1 C)  TempSrc: Oral Oral Oral Oral  SpO2: 92% 90% 94%   Weight:   136 kg   Height:        Intake/Output Summary (Last 24 hours) at 01/28/2021 1053 Last data filed at 01/27/2021 2005 Gross per 24 hour  Intake --  Output 1225 ml  Net -1225 ml   Last 3 Weights 01/28/2021 01/26/2021 01/24/2021  Weight (lbs) 299 lb 13.2 oz 304 lb 10.8 oz 293 lb 6.9 oz  Weight (kg) 136 kg 138.2 kg 133.1 kg      Telemetry    Sinus rhythm.  Up  to 7 beat runs of VT - Personally Reviewed  ECG    N/a - Personally Reviewed  Physical Exam   VS:  BP 126/72 (BP Location: Right Wrist)   Pulse 78   Temp 98.7 F (37.1 C) (Oral)   Resp 19   Ht 6' (1.829 m)   Wt 136 kg   SpO2 94%   BMI 40.66 kg/m  , BMI Body mass index is 40.66 kg/m. GENERAL:  Well appearing HEENT: Pupils equal round and reactive, fundi not visualized, oral mucosa unremarkable NECK:  No jugular venous distention, waveform within normal limits, carotid upstroke brisk and symmetric, no bruits LUNGS:  Clear to auscultation bilaterally HEART:  RRR.  PMI not displaced or sustained,S1 and S2 within normal limits, no S3, no S4, no clicks, no rubs, no murmurs ABD:  Flat, positive bowel sounds normal in frequency in pitch, no bruits, no rebound, no guarding, no midline pulsatile mass, no hepatomegaly, no splenomegaly EXT:  R BKA.  No L LE edema. SKIN:  No rashes no nodules NEURO:  Cranial nerves II through XII grossly intact, motor grossly intact throughout PSYCH:  Cognitively intact, oriented to person place and time   Labs    High Sensitivity Troponin:   Recent Labs  Lab 01/23/21 1729 01/23/21 2136 01/24/21 0230 01/24/21 0530  TROPONINIHS 399* 489* 493* 534*      Chemistry Recent Labs  Lab 01/26/21 0225 01/27/21 0059 01/27/21 1723 01/27/21 1953 01/28/21 0145  NA 133* 135 140  139  --  135  K 4.0 3.3* 3.4*  3.5  --  3.6  CL 99 99  --   --  101  CO2 23 24  --   --  26  GLUCOSE 266* 107*  --   --  174*  BUN 46* 53*  --   --  52*  CREATININE 5.05* 5.58*  --  5.52* 5.47*  CALCIUM 7.8* 7.8*  --   --  7.6*  PROT 6.4* 6.2*  --   --  6.3*  ALBUMIN 2.0* 2.0*  --   --  1.9*  AST 26 34  --   --  41  ALT 16 15  --   --  19  ALKPHOS 63 72  --   --  74  BILITOT 0.5 0.4  --   --  0.5  GFRNONAA 13* 12*  --  12* 12*  ANIONGAP 11 12  --   --  8     Hematology Recent Labs  Lab 01/26/21 0225 01/27/21 0059 01/27/21 1723 01/28/21 0145  WBC 20.8* 16.4*   --  13.0*  RBC 3.75* 3.48*  --  3.50*  HGB 11.1* 10.5* 10.5*  10.5* 10.4*  HCT 34.1* 31.4* 31.0*  31.0* 31.7*  MCV 90.9 90.2  --  90.6  MCH 29.6 30.2  --  29.7  MCHC 32.6 33.4  --  32.8  RDW 13.0 13.1  --  13.1  PLT 270 274  --  302    BNP Recent Labs  Lab 01/26/21 0225 01/27/21 0059 01/28/21 0145  BNP 503.1* 326.8* 659.5*     DDimer  Recent Labs  Lab 01/23/21 2145 01/24/21 0230  DDIMER 2.80* 3.07*     Radiology    CARDIAC CATHETERIZATION  Result Date: 01/27/2021 Findings: RA = 5 RV = 44/7 PA = 43/19 (29) PCW = 10 Fick cardiac output/index = 8.5/3.3 PVR = 2.5 WU Ao sat = 97% PA sat = 68%, 70% Assessment: 1. Normal filling pressures 2. Mild PAH 3. Normal cardiac output Plan/Discussion: Continue medical therapy. Hold diuretics. The patient developed an atrial tachycardia on insertion of the sheath in the RIJ. HR jumped 80->110. Failed vagal maneuvers. Broke back to sinus rhythm with 6mg  IV adenosine. , MD 5:47 PM   DG CHEST PORT 1 VIEW  Result Date: 01/26/2021 CLINICAL DATA:  Shortness of breath.  COVID. EXAM: PORTABLE CHEST 1 VIEW COMPARISON:  Chest x-ray 01/24/2021. FINDINGS: Cardiomegaly. No pulmonary venous congestion. Low lung volumes with mild bibasilar atelectasis. Minimal infiltrate right lung base again cannot be excluded. No pleural effusion or pneumothorax. IMPRESSION: 1.  Cardiomegaly.  No pulmonary venous congestion. 2. Low lung volumes with mild bibasilar atelectasis again noted. Minimal infiltrate in the right lung base again cannot be excluded. Chest is unchanged from prior exam. Electronically Signed   By: 01/26/2021  Register   On: 01/26/2021 15:15    Cardiac Studies   Echo 01/24/21: 1. Left ventricular ejection fraction, by estimation, is 20 to 25%. The  left ventricle has severely decreased function. The left ventricle  demonstrates global hypokinesis. There is mild concentric left ventricular  hypertrophy. Indeterminate diastolic  filling  due to E-A fusion.      No evidence of LV thrombus.  2. The mitral valve is grossly normal. Trivial mitral valve  regurgitation.   3. The aortic valve was not well visualized. Aortic valve regurgitation  is not visualized.   4. Tricuspid regurgitation signal is inadequate for assessing PA  pressure.   5. The inferior vena cava is dilated in size with <50% respiratory  variability, suggesting right atrial pressure of 15 mmHg.  RHC 01/27/21: Findings:   RA = 5 RV = 44/7 PA = 43/19 (29) PCW = 10 Fick cardiac output/index = 8.5/3.3 PVR = 2.5 WU Ao sat = 97% PA sat = 68%, 70%   Assessment: 1. Normal filling pressures 2. Mild PAH 3. Normal cardiac output   Plan/Discussion:   Continue medical therapy. Hold diuretics.   The patient developed an atrial tachycardia on insertion of the sheath in the RIJ. HR jumped 80->110. Failed vagal maneuvers. Broke back to sinus rhythm with 6mg  IV adenosine.    Patient Profile     [name] is a 80M with chronic systolic and diastolic heart failure (LVEF 20-25%), obstructive CAD not amenable to revascularization, diabetes (A1c 8.1%) hypertension, OSA, Bipolar disorder, schizophrenia, CVA and obesity admitted with sepsis from R LE abscess/cellulitis and COVID-19   Assessment & Plan    # Sepsis: # R LE abscess/cellulitis: Resolved.  Now more hemodynamically stable.  S/p R BKA.   Antibiotic course is completed.   #Acute on chronic systolic diastolic heart failure: LVEF this admission was 20 to 25%.  Initially he appeared to be volume overloaded and had not been on his torsemide for a week prior to admission.  He required volume resuscitation due to hypotension and AKI.  His renal function worsened and he was given a dose of diuretics, however his renal function continued to worsen.  His volume status has been difficult to assess.  RHC on 6/30 showed that he was euvolemic and well-compensated.  Continue to hold diuretics.  Increase metoprolol to  50mg .  BNP rising.  Renal function is plateauing.  Will eventually need to add back torsemide but continue to hold to allow for renal recovery for now.     # Medically managed CAD:  Continue aspirin, metoprolol and rosuvastatin.     For questions or updates, please contact [facility] Please consult [email or web address] for contact info under        Signed, 7/30, MD  01/28/2021, 10:53 AM
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
## 2021-01-28 NOTE — Progress Notes (Signed)
Inpatient Rehabilitation Admissions Coordinator   We await further progress/tolerance with therapies to assess if CIR candidate. Noted continues on isolation for COVID. Please clarify when precautions to be discontinued. We will follow up on Monday.  Ottie Glazier, RN, MSN Rehab Admissions Coordinator [phone] 01/28/2021 1:09 PM

## 2021-01-28 NOTE — Progress Notes (Signed)
Patient is postop day 2 status post below-knee amputation.  He is lying in bed and from a pain perspective appears comfortable. Wound VAC is working well with 2 green checks 0 cc in the canister patient will need to be transition to a Prevena VAC when discharged.  Will need 1 week follow-up in our office.
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
## 2021-01-28 NOTE — Progress Notes (Addendum)
Progress Note  Patient Name: Mitchell [name] Date of Encounter: 01/29/2021  [facility] Cardiologist: Arvilla Meres, MD   Subjective   Evaluated by Neurology due to episode of LOC while on the toilet with right sided facial droop. Highest suspicion was for vasovagal event with right sided symptoms due to known L ICA and L MCA severe stenosis as well as LVEF 20-25%. Planned for EEG, MRI.  Cr 4.48 from 5.27; currently off diuretics  Comfortable this morning. States he is tired. Facial droop improved.   Inpatient Medications    Scheduled Meds:  vitamin C  250 mg Oral Daily   aspirin EC  81 mg Oral Daily   heparin injection (subcutaneous)  7,500 Units Subcutaneous Q8H   insulin aspart  0-9 Units Subcutaneous Q4H   insulin glargine  20 Units Subcutaneous BID   metoprolol succinate  50 mg Oral Daily   multivitamin with minerals  1 tablet Oral Daily   nutrition supplement (JUVEN)  1 packet Oral BID BM   pantoprazole  40 mg Oral Daily   potassium chloride  40 mEq Oral Once   Ensure Max Protein  11 oz Oral QHS   rosuvastatin  20 mg Oral Daily   sodium chloride flush  3 mL Intravenous Q12H   sodium chloride flush  3 mL Intravenous Q12H   zinc sulfate  220 mg Oral Daily   Continuous Infusions:  sodium chloride     PRN Meds: sodium chloride, acetaminophen **OR** acetaminophen, alum & mag hydroxide-simeth, guaiFENesin-dextromethorphan, HYDROcodone-acetaminophen, HYDROmorphone (DILAUDID) injection, loperamide, ondansetron (ZOFRAN) IV, oxyCODONE, phenol, potassium chloride, sodium chloride flush   Vital Signs    Vitals:   01/29/21 0358 01/29/21 0500 01/29/21 0506 01/29/21 0800  BP: (!) 117/56 (!) 111/59 136/88   Pulse: 74 73 81   Resp: 15 20 16  (!) 24  Temp: 98.3 F (36.8 C)   97.9 F (36.6 C)  TempSrc: Oral   Axillary  SpO2: 91% 90% 93%   Weight:      Height:        Intake/Output Summary (Last 24 hours) at 01/29/2021 0810 Last data filed at 01/29/2021 0100 Gross per 24  hour  Intake --  Output 1200 ml  Net -1200 ml   Last 3 Weights 01/28/2021 01/26/2021 01/24/2021  Weight (lbs) 299 lb 13.2 oz 304 lb 10.8 oz 293 lb 6.9 oz  Weight (kg) 136 kg 138.2 kg 133.1 kg      Telemetry    NSR, PVCs, 6 beat run NSVT - Personally Reviewed  ECG    No new tracing- Personally Reviewed  Physical Exam   VS:  BP 136/88 (BP Location: Right Arm)   Pulse 81   Temp 97.9 F (36.6 C) (Axillary)   Resp (!) 24   Ht 6' (1.829 m)   Wt 136 kg   SpO2 93%   BMI 40.66 kg/m  , BMI Body mass index is 40.66 kg/m. GENERAL:  Well appearing HEENT: Pupils equal round and reactive, fundi not visualized, oral mucosa unremarkable NECK:  No jugular venous distention, waveform within normal limits, carotid upstroke brisk and symmetric, no bruits LUNGS:  Clear to auscultation bilaterally HEART:  RRR.  PMI not displaced or sustained,S1 and S2 within normal limits, no S3, no S4, no clicks, no rubs, no murmurs ABD:  Flat, positive bowel sounds normal in frequency in pitch, no bruits, no rebound, no guarding, no midline pulsatile mass, no hepatomegaly, no splenomegaly EXT:  R BKA.  No L LE edema.  SKIN:  No rashes no nodules NEURO:  Cranial nerves II through XII grossly intact, motor grossly intact throughout PSYCH:  Cognitively intact, oriented to person place and time   Labs    High Sensitivity Troponin:   Recent Labs  Lab 01/23/21 1729 01/23/21 2136 01/24/21 0230 01/24/21 0530  TROPONINIHS 399* 489* 493* 534*      Chemistry Recent Labs  Lab 01/26/21 0225 01/27/21 0059 01/27/21 1723 01/27/21 1953 01/28/21 0145 01/29/21 0038  NA 133* 135 140  139  --  135 137  K 4.0 3.3* 3.4*  3.5  --  3.6 4.0  CL 99 99  --   --  101 103  CO2 23 24  --   --  26 26  GLUCOSE 266* 107*  --   --  174* 199*  BUN 46* 53*  --   --  52* 49*  CREATININE 5.05* 5.58*  --  5.52* 5.47* 4.48*  CALCIUM 7.8* 7.8*  --   --  7.6* 8.0*  PROT 6.4* 6.2*  --   --  6.3*  --   ALBUMIN 2.0* 2.0*  --   --   1.9* 1.9*  AST 26 34  --   --  41  --   ALT 16 15  --   --  19  --   ALKPHOS 63 72  --   --  74  --   BILITOT 0.5 0.4  --   --  0.5  --   GFRNONAA 13* 12*  --  12* 12* 15*  ANIONGAP 11 12  --   --  8 8     Hematology Recent Labs  Lab 01/26/21 0225 01/27/21 0059 01/27/21 1723 01/28/21 0145  WBC 20.8* 16.4*  --  13.0*  RBC 3.75* 3.48*  --  3.50*  HGB 11.1* 10.5* 10.5*  10.5* 10.4*  HCT 34.1* 31.4* 31.0*  31.0* 31.7*  MCV 90.9 90.2  --  90.6  MCH 29.6 30.2  --  29.7  MCHC 32.6 33.4  --  32.8  RDW 13.0 13.1  --  13.1  PLT 270 274  --  302    BNP Recent Labs  Lab 01/26/21 0225 01/27/21 0059 01/28/21 0145  BNP 503.1* 326.8* 659.5*     DDimer  Recent Labs  Lab 01/23/21 2145 01/24/21 0230  DDIMER 2.80* 3.07*     Radiology    CARDIAC CATHETERIZATION  Result Date: 01/27/2021 Findings: RA = 5 RV = 44/7 PA = 43/19 (29) PCW = 10 Fick cardiac output/index = 8.5/3.3 PVR = 2.5 WU Ao sat = 97% PA sat = 68%, 70% Assessment: 1. Normal filling pressures 2. Mild PAH 3. Normal cardiac output Plan/Discussion: Continue medical therapy. Hold diuretics. The patient developed an atrial tachycardia on insertion of the sheath in the RIJ. HR jumped 80->110. Failed vagal maneuvers. Broke back to sinus rhythm with 6mg  IV adenosine. , MD 5:47 PM    Cardiac Studies   Echo 01/24/21: 1. Left ventricular ejection fraction, by estimation, is 20 to 25%. The  left ventricle has severely decreased function. The left ventricle  demonstrates global hypokinesis. There is mild concentric left ventricular  hypertrophy. Indeterminate diastolic  filling due to E-A fusion.      No evidence of LV thrombus.   2. The mitral valve is grossly normal. Trivial mitral valve  regurgitation.   3. The aortic valve was not well visualized. Aortic valve regurgitation  is not visualized.   4. Tricuspid regurgitation  signal is inadequate for assessing PA  pressure.   5. The inferior vena cava is  dilated in size with <50% respiratory  variability, suggesting right atrial pressure of 15 mmHg.  RHC 01/27/21: Findings:   RA = 5 RV = 44/7 PA = 43/19 (29) PCW = 10 Fick cardiac output/index = 8.5/3.3 PVR = 2.5 WU Ao sat = 97% PA sat = 68%, 70%   Assessment: 1. Normal filling pressures 2. Mild PAH 3. Normal cardiac output   Plan/Discussion:   Continue medical therapy. Hold diuretics.   The patient developed an atrial tachycardia on insertion of the sheath in the RIJ. HR jumped 80->110. Failed vagal maneuvers. Broke back to sinus rhythm with 6mg  IV adenosine.    Patient Profile     Mr. Ferencz is a 81M with chronic systolic and diastolic heart failure (LVEF 20-25%), obstructive CAD not amenable to revascularization, diabetes (A1c 8.1%) hypertension, OSA, Bipolar disorder, schizophrenia, CVA and obesity admitted with sepsis from R LE abscess/cellulitis and COVID-19   Assessment & Plan    # Sepsis: # R LE abscess/cellulitis: Resolved.  Status post transtibial amputation and wound VAC placement by orthopedics on 6/28   Antibiotic course is completed.  -Management per primary and orthopedics  #Acute on chronic systolic diastolic heart failure: LVEF this admission was 20 to 25%.  Initially he appeared to be volume overloaded and had not been on his torsemide for a week prior to admission.  He required volume resuscitation due to hypotension and AKI.  His renal function worsened and he was given a dose of diuretics, however his renal function continued to worsen.  His volume status has been difficult to assess.  RHC on 6/30 showed that he was euvolemic and well-compensated.  Holding diuretics for now to allow for renal recovery which is starting to slowly improve.   Will eventually need to add back torsemide. -Continue metop 50mg  XL daily -Likely add back home torsemide in the next couple of days now that renal funciton improving -GDMT limited due to significant AKI; will resume as  able -No ICD in place; patient has refused in the past  #Right facial droop: #LOC: Patient with episode of LOC on 7/1 while on the toilet with right sided facial droop. Evaluated by neurology who has highest suspicion for vasovagal event with right sided symptoms secondary to due to known L ICA and L MCA severe stenosis as well as LVEF 20-25%. Improved this AM. -Management per Neurology -MRI and EEG ordered -No significant events on tele  #AKI: Likely multifactorial in the setting of sepsis with hypotension, COVID and underlying HFrEF. Nephrology following. Renal function slowly improving.  -Nephrology following, appreciate recommendations -Holding diuretics for now to allow for renal recovery -Will add back torsemide as able  # Medically managed CAD:  Not amenable to revascularization. -Continue ASA 81mg  daily -Continue crestor 20mg  daily -Continue metop 50mg  XL daily  #COVID positive: -Continue remdesivir per primary   For questions or updates, please contact [facility] Please consult [email or web address] for contact info under        Signed, , MD  01/29/2021, 8:10 AM
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
## 2021-01-28 NOTE — Progress Notes (Signed)
Patient asked to speak with me Engineer, manufacturing systems) regarding concerns he has with his care and communication he has received regarding his care. Questions the patient had were about his fluid restricition and his rectal tube.I provided education to the patient regarding his fluid restriction and regarding the rectal tube he'd had previously during this admission (now removed). The patient verbalized understanding. Patient had his sister on his personal cell phone during the conversation. Patient's sister verbalized understanding. Neither patient nor his sister had any further questions.
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
## 2021-01-28 NOTE — Progress Notes (Signed)
Went in to ask patient about using CPAP.  Patient stated that he had tried that and was unable to tolerate it.  He does not use one at home.
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
## 2021-01-28 NOTE — Progress Notes (Signed)
Overngiht,  Alerted by telemetry that the pt had a 6 beat run of Vtach and his Qtc was greater than 500.  Pt asymptomatic. On call notified and EKG performed.

## 2021-01-28 NOTE — Progress Notes (Signed)
1545: rectal pouch removed

## 2021-01-28 NOTE — Significant Event (Addendum)
Rapid Response Event Note   Reason for Call :  Neuro change.  Per RN, pt usually alert and oriented with no neuro deficits  Around 2130 tonight, RN got pt OOB to [facility]. While on BSC, pt had acute onset R sided weakness, R facial droop, confusion, and aphasia. SBP-140s, CBG-179. Symptoms did not resolve quickly so RRT was called.   Initial Focused Assessment:  Pt sitting on [facility] conversing with staff. He is in no distress. He is able to communicate needs appropriately. Staff placed him back in bed. On exam, his only neuro deficit is a R facial droop, NIH-1.   HR-85, BP-136/88, RR-18, SpO2-92% on RA.    Interventions:  CBG-179(done PTA RRT) Code Stroke called-cancelled after neuro MD examined pt Plan of Care:  Code Stroke cancelled by Neuro MD. Pt still has a minor facial droop and some weakness in his R eye. Alert PCP of happenings. Continue to monitor pt. Call RRT if further assistance needed.    Event Summary:   MD Notified: DR. Derry Lory Call [phone] Arrival [phone] End Time:2250  Terrilyn Saver, RN

## 2021-01-28 NOTE — Progress Notes (Signed)
Per pts request I attempted to call the patients spouse, Albin Felling, I received no answer. LM to return call.

## 2021-01-29 ENCOUNTER — Inpatient Hospital Stay (HOSPITAL_COMMUNITY): Payer: No Typology Code available for payment source

## 2021-01-29 DIAGNOSIS — E785 Hyperlipidemia, unspecified: Secondary | ICD-10-CM

## 2021-01-29 DIAGNOSIS — G9341 Metabolic encephalopathy: Secondary | ICD-10-CM | POA: Diagnosis not present

## 2021-01-29 DIAGNOSIS — I5022 Chronic systolic (congestive) heart failure: Secondary | ICD-10-CM | POA: Diagnosis not present

## 2021-01-29 DIAGNOSIS — R778 Other specified abnormalities of plasma proteins: Secondary | ICD-10-CM | POA: Diagnosis not present

## 2021-01-29 DIAGNOSIS — N179 Acute kidney failure, unspecified: Secondary | ICD-10-CM | POA: Diagnosis not present

## 2021-01-29 DIAGNOSIS — I5043 Acute on chronic combined systolic (congestive) and diastolic (congestive) heart failure: Secondary | ICD-10-CM | POA: Diagnosis not present

## 2021-01-29 LAB — GLUCOSE, CAPILLARY
Glucose-Capillary: 101 mg/dL — ABNORMAL HIGH (ref 70–99)
Glucose-Capillary: 104 mg/dL — ABNORMAL HIGH (ref 70–99)
Glucose-Capillary: 114 mg/dL — ABNORMAL HIGH (ref 70–99)
Glucose-Capillary: 138 mg/dL — ABNORMAL HIGH (ref 70–99)
Glucose-Capillary: 167 mg/dL — ABNORMAL HIGH (ref 70–99)
Glucose-Capillary: 93 mg/dL (ref 70–99)
Glucose-Capillary: 95 mg/dL (ref 70–99)

## 2021-01-29 LAB — RENAL FUNCTION PANEL
Albumin: 1.9 g/dL — ABNORMAL LOW (ref 3.5–5.0)
Anion gap: 8 (ref 5–15)
BUN: 49 mg/dL — ABNORMAL HIGH (ref 6–20)
CO2: 26 mmol/L (ref 22–32)
Calcium: 8 mg/dL — ABNORMAL LOW (ref 8.9–10.3)
Chloride: 103 mmol/L (ref 98–111)
Creatinine, Ser: 4.48 mg/dL — ABNORMAL HIGH (ref 0.61–1.24)
GFR, Estimated: 15 mL/min — ABNORMAL LOW (ref >60)
Glucose, Bld: 199 mg/dL — ABNORMAL HIGH (ref 70–99)
Phosphorus: 4.5 mg/dL (ref 2.5–4.6)
Potassium: 4 mmol/L (ref 3.5–5.1)
Sodium: 137 mmol/L (ref 135–145)

## 2021-01-29 NOTE — Progress Notes (Signed)
Pt currently refusing EEG until someone talks with his wife and him regarding his care. He states he is feeling fine and does not need "unnecessary testing" at this time. RN notified. Will inform neuro.
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
## 2021-01-29 NOTE — Progress Notes (Signed)
The patient requested to attempt the bedside commode.  The patient was successfully transferred with a max heavy 2 assist.  Once on the commode, the patient began having delayed responses and difficulty finding words.   The pt had a R facial droop and his grips were significantly weaker on the right side.    The rapid nurse was called, neurology notified. Both came to bedside to assess the patient.     Pts symptoms slowly resolved over approx 30 minutes.
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
## 2021-01-30 ENCOUNTER — Inpatient Hospital Stay (HOSPITAL_COMMUNITY): Payer: No Typology Code available for payment source

## 2021-01-30 DIAGNOSIS — R778 Other specified abnormalities of plasma proteins: Secondary | ICD-10-CM | POA: Diagnosis not present

## 2021-01-30 DIAGNOSIS — U071 COVID-19: Secondary | ICD-10-CM

## 2021-01-30 DIAGNOSIS — N179 Acute kidney failure, unspecified: Secondary | ICD-10-CM | POA: Diagnosis not present

## 2021-01-30 DIAGNOSIS — I5022 Chronic systolic (congestive) heart failure: Secondary | ICD-10-CM | POA: Diagnosis not present

## 2021-01-30 DIAGNOSIS — G9341 Metabolic encephalopathy: Secondary | ICD-10-CM | POA: Diagnosis not present

## 2021-01-30 DIAGNOSIS — E785 Hyperlipidemia, unspecified: Secondary | ICD-10-CM | POA: Diagnosis not present

## 2021-01-30 DIAGNOSIS — I5043 Acute on chronic combined systolic (congestive) and diastolic (congestive) heart failure: Secondary | ICD-10-CM | POA: Diagnosis not present

## 2021-01-30 LAB — RENAL FUNCTION PANEL
Albumin: 2 g/dL — ABNORMAL LOW (ref 3.5–5.0)
Anion gap: 10 (ref 5–15)
BUN: 45 mg/dL — ABNORMAL HIGH (ref 6–20)
CO2: 23 mmol/L (ref 22–32)
Calcium: 8.2 mg/dL — ABNORMAL LOW (ref 8.9–10.3)
Chloride: 106 mmol/L (ref 98–111)
Creatinine, Ser: 3.68 mg/dL — ABNORMAL HIGH (ref 0.61–1.24)
GFR, Estimated: 19 mL/min — ABNORMAL LOW (ref >60)
Glucose, Bld: 127 mg/dL — ABNORMAL HIGH (ref 70–99)
Phosphorus: 4.3 mg/dL (ref 2.5–4.6)
Potassium: 4.3 mmol/L (ref 3.5–5.1)
Sodium: 139 mmol/L (ref 135–145)

## 2021-01-30 LAB — GLUCOSE, CAPILLARY
Glucose-Capillary: 118 mg/dL — ABNORMAL HIGH (ref 70–99)
Glucose-Capillary: 135 mg/dL — ABNORMAL HIGH (ref 70–99)
Glucose-Capillary: 138 mg/dL — ABNORMAL HIGH (ref 70–99)
Glucose-Capillary: 185 mg/dL — ABNORMAL HIGH (ref 70–99)
Glucose-Capillary: 190 mg/dL — ABNORMAL HIGH (ref 70–99)
Glucose-Capillary: 82 mg/dL (ref 70–99)

## 2021-01-30 MED ORDER — CARVEDILOL 6.25 MG PO TABS: 6.2500 mg | ORAL_TABLET | Freq: Two times a day (BID) | ORAL | Status: DC

## 2021-01-30 MED ORDER — CARVEDILOL 6.25 MG PO TABS
6.2500 mg | ORAL_TABLET | Freq: Two times a day (BID) | ORAL | Status: DC
  Administered 2021-01-31 – 2021-02-07 (×14): 6.25 mg via ORAL
  Filled 2021-01-30 (×15): qty 1

## 2021-01-30 NOTE — Progress Notes (Signed)
Progress Note  Patient Name: Mitchell [name] Date of Encounter: 01/30/2021  [facility] Cardiologist: Arvilla Meres, MD   Subjective   Feeling well this morning. No chest pain or SOB. Amenable to EEG now.  Holding diuretics to allow for renal recovery. Cr down-trending to 3.68 today  Inpatient Medications    Scheduled Meds:  vitamin C  250 mg Oral Daily   aspirin EC  81 mg Oral Daily   heparin injection (subcutaneous)  7,500 Units Subcutaneous Q8H   insulin aspart  0-9 Units Subcutaneous Q4H   insulin glargine  20 Units Subcutaneous BID   metoprolol succinate  50 mg Oral Daily   multivitamin with minerals  1 tablet Oral Daily   nutrition supplement (JUVEN)  1 packet Oral BID BM   pantoprazole  40 mg Oral Daily   potassium chloride  40 mEq Oral Once   Ensure Max Protein  11 oz Oral QHS   rosuvastatin  20 mg Oral Daily   sodium chloride flush  3 mL Intravenous Q12H   sodium chloride flush  3 mL Intravenous Q12H   zinc sulfate  220 mg Oral Daily   Continuous Infusions:  sodium chloride     PRN Meds: sodium chloride, acetaminophen **OR** acetaminophen, alum & mag hydroxide-simeth, guaiFENesin-dextromethorphan, HYDROcodone-acetaminophen, HYDROmorphone (DILAUDID) injection, loperamide, ondansetron (ZOFRAN) IV, oxyCODONE, phenol, potassium chloride, sodium chloride flush   Vital Signs    Vitals:   01/29/21 1700 01/29/21 1943 01/29/21 2349 01/30/21 0358  BP:  (!) 155/90 (!) 145/68 138/85  Pulse: 86 81 79 85  Resp:  15 19 15   Temp: 97.6 F (36.4 C) 98 F (36.7 C) 98.5 F (36.9 C) 98.3 F (36.8 C)  TempSrc: Axillary Oral Oral Oral  SpO2:  92% 94% 97%  Weight:      Height:        Intake/Output Summary (Last 24 hours) at 01/30/2021 0641 Last data filed at 01/29/2021 1100 Gross per 24 hour  Intake --  Output 375 ml  Net -375 ml    Last 3 Weights 01/28/2021 01/26/2021 01/24/2021  Weight (lbs) 299 lb 13.2 oz 304 lb 10.8 oz 293 lb 6.9 oz  Weight (kg) 136 kg 138.2 kg  133.1 kg      Telemetry     NSR, frequent PVCs- Personally Reviewed  ECG    No new tracing 01/30/21- Personally Reviewed  Physical Exam   VS:  BP 138/85 (BP Location: Right Arm)   Pulse 85   Temp 98.3 F (36.8 C) (Oral)   Resp 15   Ht 6' (1.829 m)   Wt 136 kg   SpO2 97%   BMI 40.66 kg/m  , BMI Body mass index is 40.66 kg/m. GENERAL:  Well appearing HEENT: Pupils equal round and reactive, fundi not visualized, oral mucosa unremarkable NECK:  No jugular venous distention, waveform within normal limits, carotid upstroke brisk and symmetric, no bruits LUNGS:  Clear to auscultation bilaterally HEART:  RRR.  PMI not displaced or sustained,S1 and S2 within normal limits, no S3, no S4, no clicks, no rubs, no murmurs ABD:  Flat, positive bowel sounds normal in frequency in pitch, no bruits, no rebound, no guarding, no midline pulsatile mass, no hepatomegaly, no splenomegaly EXT:  R BKA.  No L LE edema. SKIN:  No rashes no nodules NEURO:  Cranial nerves II through XII grossly intact, motor grossly intact throughout PSYCH:  Cognitively intact, oriented to person place and time   Labs    High Sensitivity  Troponin:   Recent Labs  Lab 01/23/21 1729 01/23/21 2136 01/24/21 0230 01/24/21 0530  TROPONINIHS 399* 489* 493* 534*       Chemistry Recent Labs  Lab 01/26/21 0225 01/27/21 0059 01/27/21 1723 01/28/21 0145 01/29/21 0038 01/30/21 0151  NA 133* 135   < > 135 137 139  K 4.0 3.3*   < > 3.6 4.0 4.3  CL 99 99  --  101 103 106  CO2 23 24  --  26 26 23   GLUCOSE 266* 107*  --  174* 199* 127*  BUN 46* 53*  --  52* 49* 45*  CREATININE 5.05* 5.58*   < > 5.47* 4.48* 3.68*  CALCIUM 7.8* 7.8*  --  7.6* 8.0* 8.2*  PROT 6.4* 6.2*  --  6.3*  --   --   ALBUMIN 2.0* 2.0*  --  1.9* 1.9* 2.0*  AST 26 34  --  41  --   --   ALT 16 15  --  19  --   --   ALKPHOS 63 72  --  74  --   --   BILITOT 0.5 0.4  --  0.5  --   --   GFRNONAA 13* 12*   < > 12* 15* 19*  ANIONGAP 11 12  --  8 8  10    < > = values in this interval not displayed.      Hematology Recent Labs  Lab 01/26/21 0225 01/27/21 0059 01/27/21 1723 01/28/21 0145  WBC 20.8* 16.4*  --  13.0*  RBC 3.75* 3.48*  --  3.50*  HGB 11.1* 10.5* 10.5*  10.5* 10.4*  HCT 34.1* 31.4* 31.0*  31.0* 31.7*  MCV 90.9 90.2  --  90.6  MCH 29.6 30.2  --  29.7  MCHC 32.6 33.4  --  32.8  RDW 13.0 13.1  --  13.1  PLT 270 274  --  302     BNP Recent Labs  Lab 01/26/21 0225 01/27/21 0059 01/28/21 0145  BNP 503.1* 326.8* 659.5*      DDimer  Recent Labs  Lab 01/23/21 2145 01/24/21 0230  DDIMER 2.80* 3.07*      Radiology    No results found.  Cardiac Studies   Echo 01/24/21: 1. Left ventricular ejection fraction, by estimation, is 20 to 25%. The  left ventricle has severely decreased function. The left ventricle  demonstrates global hypokinesis. There is mild concentric left ventricular  hypertrophy. Indeterminate diastolic  filling due to E-A fusion.      No evidence of LV thrombus.   2. The mitral valve is grossly normal. Trivial mitral valve  regurgitation.   3. The aortic valve was not well visualized. Aortic valve regurgitation  is not visualized.   4. Tricuspid regurgitation signal is inadequate for assessing PA  pressure.   5. The inferior vena cava is dilated in size with <50% respiratory  variability, suggesting right atrial pressure of 15 mmHg.  RHC 01/27/21: Findings:   RA = 5 RV = 44/7 PA = 43/19 (29) PCW = 10 Fick cardiac output/index = 8.5/3.3 PVR = 2.5 WU Ao sat = 97% PA sat = 68%, 70%   Assessment: 1. Normal filling pressures 2. Mild PAH 3. Normal cardiac output   Plan/Discussion:   Continue medical therapy. Hold diuretics.   The patient developed an atrial tachycardia on insertion of the sheath in the RIJ. HR jumped 80->110. Failed vagal maneuvers. Broke back to sinus rhythm with 6mg  IV adenosine.  Patient Profile     Mr. Riddles is a 33M with chronic systolic  and diastolic heart failure (LVEF 20-25%), obstructive CAD not amenable to revascularization, diabetes (A1c 8.1%) hypertension, OSA, Bipolar disorder, schizophrenia, CVA and obesity admitted with sepsis from R LE abscess/cellulitis and COVID-19   Assessment & Plan    # Sepsis: # R LE abscess/cellulitis: Resolved. Status post transtibial amputation and wound VAC placement by orthopedics on 6/28   Antibiotic course is completed.  -Management per primary and orthopedics  #Acute on chronic systolic diastolic heart failure: LVEF this admission was 20 to 25%.  Initially he appeared to be volume overloaded and had not been on his torsemide for a week prior to admission.  He required volume resuscitation due to hypotension and AKI.  His renal function worsened and he was given a dose of diuretics, however his renal function continued to worsen.  His volume status has been difficult to assess.  RHC on 6/30 showed that he was euvolemic and well-compensated.  Holding diuretics for now to allow for renal recovery which is starting to slowly improve.   Will eventually need to add back torsemide. -Change metop to coreg 6.25mg  BID for better blood pressure control -Diuretics per renal -GDMT limited due to significant AKI; will resume as able -No ICD in place; patient has refused in the past  #Right facial droop: #LOC: Patient with episode of LOC on 7/1 while on the toilet with right sided facial droop. Evaluated by neurology who has highest suspicion for vasovagal event with right sided symptoms secondary to due to known L ICA and L MCA severe stenosis as well as LVEF 20-25%.  -Management per Neurology -MRI and EEG ordered; patient amenable to EEG this AM -No significant events on tele  #AKI: Likely multifactorial in the setting of sepsis with hypotension, COVID and underlying HFrEF. Nephrology following. Renal function improving. Holding diuretics currently. -Nephrology following, appreciate  recommendations -Holding diuretics for now to allow for renal recovery -Will add back torsemide as able  # Medically managed CAD:  Not amenable to revascularization. -Continue ASA 81mg  daily -Continue crestor 20mg  daily -Change metop to coreg 6.25mg  BID as above  #COVID positive: -S/p >10 day course of remdesivir  -COVID restrictions removed  Cardiology will sign-off at this time. Please do not hesitate to call with questions or concerns.    For questions or updates, please contact [facility] Please consult [email or web address] for contact info under        Signed, , MD  01/30/2021, 6:41 AM
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
## 2021-01-30 NOTE — Progress Notes (Signed)
PT Cancellation Note  Patient Details Name: Mitchell [name] MRN: [id number] DOB: 1971-01-19   Cancelled Treatment:    Reason Eval/Treat Not Completed: Other (comment)  Was undergoing EEG when attempted PT session;   Will follow up later today as time allows;  Otherwise, will follow up for PT tomorrow;   Thank you,  Van Clines, PT  Acute Rehabilitation Services Pager [phone] Office [phone]    Levi Aland 01/30/2021, 12:05 PM

## 2021-01-30 NOTE — Progress Notes (Signed)
EEG Completed; Results Pending  
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
## 2021-01-31 DIAGNOSIS — I5043 Acute on chronic combined systolic (congestive) and diastolic (congestive) heart failure: Secondary | ICD-10-CM | POA: Diagnosis not present

## 2021-01-31 DIAGNOSIS — N179 Acute kidney failure, unspecified: Secondary | ICD-10-CM | POA: Diagnosis not present

## 2021-01-31 LAB — RENAL FUNCTION PANEL
Albumin: 2 g/dL — ABNORMAL LOW (ref 3.5–5.0)
Anion gap: 7 (ref 5–15)
BUN: 37 mg/dL — ABNORMAL HIGH (ref 6–20)
CO2: 26 mmol/L (ref 22–32)
Calcium: 8.3 mg/dL — ABNORMAL LOW (ref 8.9–10.3)
Chloride: 106 mmol/L (ref 98–111)
Creatinine, Ser: 3.08 mg/dL — ABNORMAL HIGH (ref 0.61–1.24)
GFR, Estimated: 23 mL/min — ABNORMAL LOW (ref >60)
Glucose, Bld: 148 mg/dL — ABNORMAL HIGH (ref 70–99)
Phosphorus: 3.7 mg/dL (ref 2.5–4.6)
Potassium: 4.6 mmol/L (ref 3.5–5.1)
Sodium: 139 mmol/L (ref 135–145)

## 2021-01-31 LAB — CBC
HCT: 33.2 % — ABNORMAL LOW (ref 39.0–52.0)
Hemoglobin: 10.5 g/dL — ABNORMAL LOW (ref 13.0–17.0)
MCH: 29.6 pg (ref 26.0–34.0)
MCHC: 31.6 g/dL (ref 30.0–36.0)
MCV: 93.5 fL (ref 80.0–100.0)
Platelets: 337 10*3/uL (ref 150–400)
RBC: 3.55 MIL/uL — ABNORMAL LOW (ref 4.22–5.81)
RDW: 13.2 % (ref 11.5–15.5)
WBC: 15.1 10*3/uL — ABNORMAL HIGH (ref 4.0–10.5)
nRBC: 0 % (ref 0.0–0.2)

## 2021-01-31 LAB — GLUCOSE, CAPILLARY
Glucose-Capillary: 126 mg/dL — ABNORMAL HIGH (ref 70–99)
Glucose-Capillary: 131 mg/dL — ABNORMAL HIGH (ref 70–99)
Glucose-Capillary: 139 mg/dL — ABNORMAL HIGH (ref 70–99)
Glucose-Capillary: 140 mg/dL — ABNORMAL HIGH (ref 70–99)
Glucose-Capillary: 215 mg/dL — ABNORMAL HIGH (ref 70–99)

## 2021-01-31 MED ORDER — TORSEMIDE 20 MG PO TABS
10.0000 mg | ORAL_TABLET | Freq: Every day | ORAL | Status: DC
  Administered 2021-02-01 – 2021-02-07 (×7): 10 mg via ORAL
  Filled 2021-01-31 (×7): qty 1
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
## 2021-01-31 NOTE — Progress Notes (Signed)
Patient refused CPAP for the night.  Patient on [place] stable and resting well.

## 2021-01-31 NOTE — Progress Notes (Signed)
Inpatient Rehab Admissions Coordinator:   Pt. Now off isolation precautions for COVID. He has not been seen by PT in the last 5 days, so I cannot fully assess candidacy. I will await updated therapy notes and pursue admit if he appears to be an appropriate candidate.   Megan Salon, MS, CCC-SLP Rehab Admissions Coordinator  [phone] (celll) [phone] (office)
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
## 2021-01-31 NOTE — Progress Notes (Signed)
     Subjective: 4 Days Post-Op Procedure(s) (LRB): RIGHT HEART CATH (N/A) Pleasant 50 year old male, post op right BKA, VAC inplace with post amputation, knee brace.  Patient reports pain as mild.    Objective:   VITALS:  Temp:  [98.1 F (36.7 C)-98.8 F (37.1 C)] 98.5 F (36.9 C) (07/04 1236) Pulse Rate:  [74-86] 86 (07/04 1236) Resp:  [16-21] 21 (07/04 1236) BP: (119-145)/(65-82) 119/65 (07/04 1236) SpO2:  [92 %-96 %] 95 % (07/04 1236)  ABD soft Neurovascular intact   LABS Recent Labs    01/31/21 0649  HGB 10.5*  WBC 15.1*  PLT 337   Recent Labs    01/30/21 0151 01/31/21 0649  NA 139 139  K 4.3 4.6  CL 106 106  CO2 23 26  BUN 45* 37*  CREATININE 3.68* 3.08*  GLUCOSE 127* 148*   No results for input(s): LABPT, INR in the last 72 hours.   Assessment/Plan: 4 Days Post-Op Procedure(s) (LRB): RIGHT HEART CATH (N/A)  Advance diet Up with therapy PT OT and when he is ready DC to home or SNF.   Vira Browns 01/31/2021, 1:04 PM Patient ID: [name], male   DOB: 01/25/1971, 50 y.o.   MRN: [id number]
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
## 2021-02-01 DIAGNOSIS — I5043 Acute on chronic combined systolic (congestive) and diastolic (congestive) heart failure: Secondary | ICD-10-CM | POA: Diagnosis not present

## 2021-02-01 LAB — BASIC METABOLIC PANEL
Anion gap: 5 (ref 5–15)
BUN: 35 mg/dL — ABNORMAL HIGH (ref 6–20)
CO2: 28 mmol/L (ref 22–32)
Calcium: 8.3 mg/dL — ABNORMAL LOW (ref 8.9–10.3)
Chloride: 104 mmol/L (ref 98–111)
Creatinine, Ser: 2.75 mg/dL — ABNORMAL HIGH (ref 0.61–1.24)
GFR, Estimated: 27 mL/min — ABNORMAL LOW (ref >60)
Glucose, Bld: 191 mg/dL — ABNORMAL HIGH (ref 70–99)
Potassium: 4.3 mmol/L (ref 3.5–5.1)
Sodium: 137 mmol/L (ref 135–145)

## 2021-02-01 LAB — GLUCOSE, CAPILLARY
Glucose-Capillary: 187 mg/dL — ABNORMAL HIGH (ref 70–99)
Glucose-Capillary: 217 mg/dL — ABNORMAL HIGH (ref 70–99)
Glucose-Capillary: 185 mg/dL — ABNORMAL HIGH (ref 70–99)
Glucose-Capillary: 141 mg/dL — ABNORMAL HIGH (ref 70–99)
Glucose-Capillary: 139 mg/dL — ABNORMAL HIGH (ref 70–99)
Glucose-Capillary: 135 mg/dL — ABNORMAL HIGH (ref 70–99)
Glucose-Capillary: 144 mg/dL — ABNORMAL HIGH (ref 70–99)

## 2021-02-01 MED ORDER — HYDRALAZINE HCL 20 MG/ML IJ SOLN
5.0000 mg | Freq: Four times a day (QID) | INTRAMUSCULAR | Status: DC | PRN
  Administered 2021-02-04: 5 mg via INTRAVENOUS
  Filled 2021-02-01: qty 1

## 2021-02-01 NOTE — Progress Notes (Signed)
Patient ID: Mitchell [name], male   DOB: 08-15-1970, 50 y.o.   MRN: [id number] Patient is status post transtibial amputation there is no drainage in the wound VAC canister there is a good suction fit.  Social worker consult was replaced patient will need either inpatient or outpatient rehab.  Patient will need to be seen by physical therapy today.  Placement and treatment delay secondary to COVID.

## 2021-02-01 NOTE — TOC Progression Note (Signed)
Transition of Care [facility]) - Progression Note    Patient Details  Name: Mitchell [name] MRN: [id number] Date of Birth: [birth year]  [facility] CM/SW Contact  [name], LCSW Phone Number: 02/01/2021, 10:42 AM  Clinical Narrative:    CSW received request to speak with patient. Patient expressed frustration that no one has updated him on his discharge plan. CSW explained that it is a process with a lot of moving parts to determine if he is eligible for inpatient rehab now that he is off COVID precautions and medically stable with updated therapy notes. Patient had his wife on speaker phone as well and she expressed understanding that [facility] is evaluating patient. CSW described other options (SNF/HH) in the event that they cannot accept patient or if insurance denies CIR. Patient reported understanding and is appreciative of being kept updated.    Expected Discharge Plan: IP Rehab Facility Barriers to Discharge: Continued Medical Work up  Expected Discharge Plan and Services Expected Discharge Plan: IP Rehab Facility In-house Referral: Clinical Social Work Discharge Planning Services: CM Consult Post Acute Care Choice: IP Rehab, Skilled Nursing Facility Living arrangements for the past 2 months: Single Family Home                                       Social Determinants of Health (SDOH) Interventions    Readmission Risk Interventions No flowsheet data found.
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
## 2021-02-01 NOTE — Progress Notes (Signed)
Inpatient Rehab Admissions Coordinator:   I met with Pt. To discuss potential CIR admit. Pt. Appears appropriate, reports he has 24/7 support. I will submit for insurance auth today.    , MS, CCC-SLP Rehab Admissions Coordinator  [phone] (celll) [phone] (office)  
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
## 2021-02-02 DIAGNOSIS — E11628 Type 2 diabetes mellitus with other skin complications: Secondary | ICD-10-CM | POA: Diagnosis not present

## 2021-02-02 DIAGNOSIS — L089 Local infection of the skin and subcutaneous tissue, unspecified: Secondary | ICD-10-CM | POA: Diagnosis not present

## 2021-02-02 LAB — CBC
HCT: 30.2 % — ABNORMAL LOW (ref 39.0–52.0)
Hemoglobin: 9.6 g/dL — ABNORMAL LOW (ref 13.0–17.0)
MCH: 29.7 pg (ref 26.0–34.0)
MCHC: 31.8 g/dL (ref 30.0–36.0)
MCV: 93.5 fL (ref 80.0–100.0)
Platelets: 388 10*3/uL (ref 150–400)
RBC: 3.23 MIL/uL — ABNORMAL LOW (ref 4.22–5.81)
RDW: 13.2 % (ref 11.5–15.5)
WBC: 13.8 10*3/uL — ABNORMAL HIGH (ref 4.0–10.5)
nRBC: 0 % (ref 0.0–0.2)

## 2021-02-02 LAB — GLUCOSE, CAPILLARY
Glucose-Capillary: 127 mg/dL — ABNORMAL HIGH (ref 70–99)
Glucose-Capillary: 145 mg/dL — ABNORMAL HIGH (ref 70–99)
Glucose-Capillary: 150 mg/dL — ABNORMAL HIGH (ref 70–99)
Glucose-Capillary: 154 mg/dL — ABNORMAL HIGH (ref 70–99)
Glucose-Capillary: 165 mg/dL — ABNORMAL HIGH (ref 70–99)
Glucose-Capillary: 91 mg/dL (ref 70–99)

## 2021-02-02 LAB — BASIC METABOLIC PANEL
Anion gap: 6 (ref 5–15)
BUN: 28 mg/dL — ABNORMAL HIGH (ref 6–20)
CO2: 29 mmol/L (ref 22–32)
Calcium: 8.4 mg/dL — ABNORMAL LOW (ref 8.9–10.3)
Chloride: 104 mmol/L (ref 98–111)
Creatinine, Ser: 2.6 mg/dL — ABNORMAL HIGH (ref 0.61–1.24)
GFR, Estimated: 29 mL/min — ABNORMAL LOW (ref >60)
Glucose, Bld: 120 mg/dL — ABNORMAL HIGH (ref 70–99)
Potassium: 4.5 mmol/L (ref 3.5–5.1)
Sodium: 139 mmol/L (ref 135–145)
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
## 2021-02-02 NOTE — Progress Notes (Signed)
Inpatient Rehab Admissions Coordinator:   I continue to await insurance auth for CIR for this Pt. I do not have a bed for him today.   Megan Salon, MS, CCC-SLP Rehab Admissions Coordinator  [phone] (celll) [phone] (office)

## 2021-02-03 DIAGNOSIS — L089 Local infection of the skin and subcutaneous tissue, unspecified: Secondary | ICD-10-CM | POA: Diagnosis not present

## 2021-02-03 DIAGNOSIS — E11628 Type 2 diabetes mellitus with other skin complications: Secondary | ICD-10-CM | POA: Diagnosis not present

## 2021-02-03 LAB — COMPREHENSIVE METABOLIC PANEL
ALT: 14 U/L (ref 0–44)
AST: 19 U/L (ref 15–41)
Albumin: 2.1 g/dL — ABNORMAL LOW (ref 3.5–5.0)
Alkaline Phosphatase: 56 U/L (ref 38–126)
Anion gap: 10 (ref 5–15)
BUN: 28 mg/dL — ABNORMAL HIGH (ref 6–20)
CO2: 24 mmol/L (ref 22–32)
Calcium: 8.5 mg/dL — ABNORMAL LOW (ref 8.9–10.3)
Chloride: 103 mmol/L (ref 98–111)
Creatinine, Ser: 2.7 mg/dL — ABNORMAL HIGH (ref 0.61–1.24)
GFR, Estimated: 27 mL/min — ABNORMAL LOW (ref >60)
Glucose, Bld: 106 mg/dL — ABNORMAL HIGH (ref 70–99)
Potassium: 4.5 mmol/L (ref 3.5–5.1)
Sodium: 137 mmol/L (ref 135–145)
Total Bilirubin: 0.7 mg/dL (ref 0.3–1.2)
Total Protein: 7.3 g/dL (ref 6.5–8.1)

## 2021-02-03 LAB — CBC WITH DIFFERENTIAL/PLATELET
Abs Immature Granulocytes: 0.48 10*3/uL — ABNORMAL HIGH (ref 0.00–0.07)
Basophils Absolute: 0 10*3/uL (ref 0.0–0.1)
Basophils Relative: 0 %
Eosinophils Absolute: 0.1 10*3/uL (ref 0.0–0.5)
Eosinophils Relative: 1 %
HCT: 30.7 % — ABNORMAL LOW (ref 39.0–52.0)
Hemoglobin: 9.6 g/dL — ABNORMAL LOW (ref 13.0–17.0)
Immature Granulocytes: 4 %
Lymphocytes Relative: 11 %
Lymphs Abs: 1.5 10*3/uL (ref 0.7–4.0)
MCH: 29.4 pg (ref 26.0–34.0)
MCHC: 31.3 g/dL (ref 30.0–36.0)
MCV: 93.9 fL (ref 80.0–100.0)
Monocytes Absolute: 0.9 10*3/uL (ref 0.1–1.0)
Monocytes Relative: 7 %
Neutro Abs: 10.1 10*3/uL — ABNORMAL HIGH (ref 1.7–7.7)
Neutrophils Relative %: 77 %
Platelets: 366 10*3/uL (ref 150–400)
RBC: 3.27 MIL/uL — ABNORMAL LOW (ref 4.22–5.81)
RDW: 13.1 % (ref 11.5–15.5)
WBC: 13.1 10*3/uL — ABNORMAL HIGH (ref 4.0–10.5)
nRBC: 0 % (ref 0.0–0.2)

## 2021-02-03 LAB — GLUCOSE, CAPILLARY
Glucose-Capillary: 100 mg/dL — ABNORMAL HIGH (ref 70–99)
Glucose-Capillary: 106 mg/dL — ABNORMAL HIGH (ref 70–99)
Glucose-Capillary: 112 mg/dL — ABNORMAL HIGH (ref 70–99)
Glucose-Capillary: 156 mg/dL — ABNORMAL HIGH (ref 70–99)
Glucose-Capillary: 92 mg/dL (ref 70–99)
Glucose-Capillary: 99 mg/dL (ref 70–99)

## 2021-02-03 LAB — BRAIN NATRIURETIC PEPTIDE: B Natriuretic Peptide: 518.1 pg/mL — ABNORMAL HIGH (ref 0.0–100.0)

## 2021-02-03 LAB — MAGNESIUM: Magnesium: 1.9 mg/dL (ref 1.7–2.4)

## 2021-02-03 MED ORDER — ADULT MULTIVITAMIN W/MINERALS CH: 1.0000 | ORAL_TABLET | Freq: Every day | ORAL | Status: DC

## 2021-02-03 MED ORDER — TORSEMIDE 10 MG PO TABS
10.0000 mg | ORAL_TABLET | Freq: Every day | ORAL | Status: DC
Start: 2021-02-04 — End: 2021-02-17

## 2021-02-03 MED ORDER — OXYCODONE-ACETAMINOPHEN 7.5-325 MG PO TABS: 1.0000 | ORAL_TABLET | Freq: Three times a day (TID) | ORAL | 0 refills | Status: DC | PRN

## 2021-02-03 MED ORDER — HYDROCODONE-ACETAMINOPHEN 5-325 MG PO TABS
1.0000 | ORAL_TABLET | ORAL | Status: DC | PRN
  Administered 2021-02-03 – 2021-02-06 (×3): 1 via ORAL
  Filled 2021-02-03 (×3): qty 1

## 2021-02-03 MED ORDER — INSULIN ASPART 100 UNIT/ML FLEXPEN: PEN_INJECTOR | SUBCUTANEOUS | 11 refills | Status: DC

## 2021-02-03 MED ORDER — ACETAMINOPHEN 325 MG PO TABS: 650.0000 mg | ORAL_TABLET | Freq: Four times a day (QID) | ORAL | Status: DC | PRN

## 2021-02-03 MED ORDER — PANTOPRAZOLE SODIUM 40 MG PO TBEC
40.0000 mg | DELAYED_RELEASE_TABLET | Freq: Every day | ORAL | Status: DC
Start: 2021-02-04 — End: 2021-02-17

## 2021-02-03 MED ORDER — ROSUVASTATIN CALCIUM 20 MG PO TABS: 20.0000 mg | ORAL_TABLET | Freq: Every day | ORAL | 0 refills | Status: DC

## 2021-02-03 MED ORDER — INSULIN GLARGINE 100 UNIT/ML ~~LOC~~ SOLN: 20.0000 [IU] | Freq: Two times a day (BID) | SUBCUTANEOUS | 11 refills | Status: DC

## 2021-02-03 MED ORDER — OXYCODONE-ACETAMINOPHEN 7.5-325 MG PO TABS
1.0000 | ORAL_TABLET | Freq: Three times a day (TID) | ORAL | Status: DC | PRN
  Administered 2021-02-04 – 2021-02-07 (×6): 1 via ORAL
  Filled 2021-02-03 (×7): qty 1

## 2021-02-03 NOTE — Discharge Summary (Addendum)
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 Normal affect [place].AT,PERRAL Supple  Neck,No JVD, No cervical lymphadenopathy appriciated.  Symmetrical Chest wall movement, Good air movement bilaterally, CTAB RRR,No Gallops,Rubs or new Murmurs, No Parasternal Heave +ve B.Sounds, Abd Soft, Non tender, No organomegaly appriciated, No rebound -guarding or rigidity. No Cyanosis, R. BKA stump under bandage with W.Vac   Data Review   CBC w Diff:  Lab Results  Component Value Date   WBC 12.1 (H) 02/05/2021   HGB 9.5 (L) 02/05/2021   HCT 30.8 (L) 02/05/2021   PLT 385 02/05/2021   LYMPHOPCT 13 02/05/2021   MONOPCT 7 02/05/2021   EOSPCT 1 02/05/2021   BASOPCT 0 02/05/2021    CMP:  Lab Results  Component Value Date   NA 137 02/07/2021   K 4.9 02/07/2021   CL 104 02/07/2021   CO2 27 02/07/2021   BUN 34 (H) 02/07/2021   CREATININE 2.47 (H) 02/07/2021   PROT 7.7 02/05/2021    ALBUMIN 2.2 (L) 02/05/2021   BILITOT 0.4 02/05/2021   ALKPHOS 49 02/05/2021   AST 19 02/05/2021   ALT 13 02/05/2021  .   Total Time in preparing paper work, data evaluation and todays exam - 49 minutes  Lala Lund M.D on 02/07/2021 at 9:30 AM  [facility]

## 2021-02-03 NOTE — Progress Notes (Signed)
Patient ID: Mitchell [name], male   DOB: May 26, 1971, 50 y.o.   MRN: [id number]   Patient is about 9 days status post right transtibial amputation.  There is about 25 cc in the wound VAC canister.  I remove the wound VAC dressing the incision is well approximated there is a superficial blister posteriorly which is most likely where the drainage came from.  We will have 4 x 4's and Ace wrap applied plus the compression sock.  Patient may discharge at this time to rehab with daily dry dressing changes where these limb protector to protect from falls and wear the stump shrinker to facilitate healing.  I will follow-up in the office in 1 to 2 weeks
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
## 2021-02-03 NOTE — Progress Notes (Signed)
Pt states he would prefer a rehab setting vs home on discharge.

## 2021-02-03 NOTE — Discharge Instructions (Signed)
Follow with Primary MD [name], Gwenlyn Found, MD in 7 days   Get CBC, CMP, 2 view Chest X ray -  checked next visit within 1 week by Primary MD or SNF MD   Activity: As tolerated with Full fall precautions use walker/cane & assistance as needed  Disposition SNF  Diet: Heart Healthy Low Carb, 1.5 lit/day fluid restriction. Check CBGs QAC-HS  Special Instructions: If you have smoked or chewed Tobacco  in the last 2 yrs please stop smoking, stop any regular Alcohol  and or any Recreational drug use.  On your next visit with your primary care physician please Get Medicines reviewed and adjusted.  Please request your Prim.MD to go over all Hospital Tests and Procedure/Radiological results at the follow up, please get all Hospital records sent to your Prim MD by signing hospital release before you go home.  If you experience worsening of your admission symptoms, develop shortness of breath, life threatening emergency, suicidal or homicidal thoughts you must seek medical attention immediately by calling 911 or calling your MD immediately  if symptoms less severe.  You Must read complete instructions/literature along with all the possible adverse reactions/side effects for all the Medicines you take and that have been prescribed to you. Take any new Medicines after you have completely understood and accpet all the possible adverse reactions/side effects.

## 2021-02-04 DIAGNOSIS — L089 Local infection of the skin and subcutaneous tissue, unspecified: Secondary | ICD-10-CM | POA: Diagnosis not present

## 2021-02-04 DIAGNOSIS — E11628 Type 2 diabetes mellitus with other skin complications: Secondary | ICD-10-CM | POA: Diagnosis not present

## 2021-02-04 LAB — COMPREHENSIVE METABOLIC PANEL
ALT: 13 U/L (ref 0–44)
AST: 18 U/L (ref 15–41)
Albumin: 2.1 g/dL — ABNORMAL LOW (ref 3.5–5.0)
Alkaline Phosphatase: 50 U/L (ref 38–126)
Anion gap: 5 (ref 5–15)
BUN: 33 mg/dL — ABNORMAL HIGH (ref 6–20)
CO2: 28 mmol/L (ref 22–32)
Calcium: 8.4 mg/dL — ABNORMAL LOW (ref 8.9–10.3)
Chloride: 103 mmol/L (ref 98–111)
Creatinine, Ser: 2.76 mg/dL — ABNORMAL HIGH (ref 0.61–1.24)
GFR, Estimated: 27 mL/min — ABNORMAL LOW (ref >60)
Glucose, Bld: 133 mg/dL — ABNORMAL HIGH (ref 70–99)
Potassium: 4.5 mmol/L (ref 3.5–5.1)
Sodium: 136 mmol/L (ref 135–145)
Total Bilirubin: 0.7 mg/dL (ref 0.3–1.2)
Total Protein: 7.4 g/dL (ref 6.5–8.1)

## 2021-02-04 LAB — CBC WITH DIFFERENTIAL/PLATELET
Abs Immature Granulocytes: 0.25 10*3/uL — ABNORMAL HIGH (ref 0.00–0.07)
Basophils Absolute: 0 10*3/uL (ref 0.0–0.1)
Basophils Relative: 0 %
Eosinophils Absolute: 0.1 10*3/uL (ref 0.0–0.5)
Eosinophils Relative: 1 %
HCT: 29.2 % — ABNORMAL LOW (ref 39.0–52.0)
Hemoglobin: 9.2 g/dL — ABNORMAL LOW (ref 13.0–17.0)
Immature Granulocytes: 2 %
Lymphocytes Relative: 12 %
Lymphs Abs: 1.4 10*3/uL (ref 0.7–4.0)
MCH: 29.4 pg (ref 26.0–34.0)
MCHC: 31.5 g/dL (ref 30.0–36.0)
MCV: 93.3 fL (ref 80.0–100.0)
Monocytes Absolute: 0.7 10*3/uL (ref 0.1–1.0)
Monocytes Relative: 6 %
Neutro Abs: 9.4 10*3/uL — ABNORMAL HIGH (ref 1.7–7.7)
Neutrophils Relative %: 79 %
Platelets: 354 10*3/uL (ref 150–400)
RBC: 3.13 MIL/uL — ABNORMAL LOW (ref 4.22–5.81)
RDW: 13.1 % (ref 11.5–15.5)
WBC: 11.8 10*3/uL — ABNORMAL HIGH (ref 4.0–10.5)
nRBC: 0 % (ref 0.0–0.2)

## 2021-02-04 LAB — GLUCOSE, CAPILLARY
Glucose-Capillary: 101 mg/dL — ABNORMAL HIGH (ref 70–99)
Glucose-Capillary: 120 mg/dL — ABNORMAL HIGH (ref 70–99)
Glucose-Capillary: 125 mg/dL — ABNORMAL HIGH (ref 70–99)
Glucose-Capillary: 134 mg/dL — ABNORMAL HIGH (ref 70–99)
Glucose-Capillary: 138 mg/dL — ABNORMAL HIGH (ref 70–99)
Glucose-Capillary: 142 mg/dL — ABNORMAL HIGH (ref 70–99)
Glucose-Capillary: 162 mg/dL — ABNORMAL HIGH (ref 70–99)

## 2021-02-04 LAB — BRAIN NATRIURETIC PEPTIDE: B Natriuretic Peptide: 551.9 pg/mL — ABNORMAL HIGH (ref 0.0–100.0)

## 2021-02-04 LAB — MAGNESIUM: Magnesium: 1.7 mg/dL (ref 1.7–2.4)

## 2021-02-04 NOTE — Progress Notes (Signed)
Inpatient Rehab Admissions Coordinator:   I received insurance auth for CIR; however, I do not currently have a bed for him. I will consider Pt. For priority admit if a bed becomes available over the weekend.   Megan Salon, MS, CCC-SLP Rehab Admissions Coordinator  [phone] (celll) [phone] (office)
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
## 2021-02-04 NOTE — Progress Notes (Signed)
Patient refused CPAP foe the night
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
## 2021-02-05 DIAGNOSIS — E11628 Type 2 diabetes mellitus with other skin complications: Secondary | ICD-10-CM | POA: Diagnosis not present

## 2021-02-05 DIAGNOSIS — L089 Local infection of the skin and subcutaneous tissue, unspecified: Secondary | ICD-10-CM | POA: Diagnosis not present

## 2021-02-05 LAB — CBC WITH DIFFERENTIAL/PLATELET
Abs Immature Granulocytes: 0.16 10*3/uL — ABNORMAL HIGH (ref 0.00–0.07)
Basophils Absolute: 0 10*3/uL (ref 0.0–0.1)
Basophils Relative: 0 %
Eosinophils Absolute: 0.1 10*3/uL (ref 0.0–0.5)
Eosinophils Relative: 1 %
HCT: 30.8 % — ABNORMAL LOW (ref 39.0–52.0)
Hemoglobin: 9.5 g/dL — ABNORMAL LOW (ref 13.0–17.0)
Immature Granulocytes: 1 %
Lymphocytes Relative: 13 %
Lymphs Abs: 1.5 10*3/uL (ref 0.7–4.0)
MCH: 29 pg (ref 26.0–34.0)
MCHC: 30.8 g/dL (ref 30.0–36.0)
MCV: 93.9 fL (ref 80.0–100.0)
Monocytes Absolute: 0.8 10*3/uL (ref 0.1–1.0)
Monocytes Relative: 7 %
Neutro Abs: 9.4 10*3/uL — ABNORMAL HIGH (ref 1.7–7.7)
Neutrophils Relative %: 78 %
Platelets: 385 10*3/uL (ref 150–400)
RBC: 3.28 MIL/uL — ABNORMAL LOW (ref 4.22–5.81)
RDW: 13 % (ref 11.5–15.5)
WBC: 12.1 10*3/uL — ABNORMAL HIGH (ref 4.0–10.5)
nRBC: 0 % (ref 0.0–0.2)

## 2021-02-05 LAB — COMPREHENSIVE METABOLIC PANEL
ALT: 13 U/L (ref 0–44)
AST: 19 U/L (ref 15–41)
Albumin: 2.2 g/dL — ABNORMAL LOW (ref 3.5–5.0)
Alkaline Phosphatase: 49 U/L (ref 38–126)
Anion gap: 9 (ref 5–15)
BUN: 32 mg/dL — ABNORMAL HIGH (ref 6–20)
CO2: 28 mmol/L (ref 22–32)
Calcium: 8.7 mg/dL — ABNORMAL LOW (ref 8.9–10.3)
Chloride: 101 mmol/L (ref 98–111)
Creatinine, Ser: 2.64 mg/dL — ABNORMAL HIGH (ref 0.61–1.24)
GFR, Estimated: 28 mL/min — ABNORMAL LOW (ref >60)
Glucose, Bld: 114 mg/dL — ABNORMAL HIGH (ref 70–99)
Potassium: 5 mmol/L (ref 3.5–5.1)
Sodium: 138 mmol/L (ref 135–145)
Total Bilirubin: 0.4 mg/dL (ref 0.3–1.2)
Total Protein: 7.7 g/dL (ref 6.5–8.1)

## 2021-02-05 LAB — GLUCOSE, CAPILLARY
Glucose-Capillary: 117 mg/dL — ABNORMAL HIGH (ref 70–99)
Glucose-Capillary: 118 mg/dL — ABNORMAL HIGH (ref 70–99)
Glucose-Capillary: 121 mg/dL — ABNORMAL HIGH (ref 70–99)
Glucose-Capillary: 140 mg/dL — ABNORMAL HIGH (ref 70–99)
Glucose-Capillary: 156 mg/dL — ABNORMAL HIGH (ref 70–99)
Glucose-Capillary: 99 mg/dL (ref 70–99)

## 2021-02-05 LAB — BRAIN NATRIURETIC PEPTIDE: B Natriuretic Peptide: 421.9 pg/mL — ABNORMAL HIGH (ref 0.0–100.0)

## 2021-02-05 LAB — MAGNESIUM: Magnesium: 1.8 mg/dL (ref 1.7–2.4)

## 2021-02-05 MED ORDER — SODIUM POLYSTYRENE SULFONATE 15 GM/60ML PO SUSP
15.0000 g | Freq: Once | ORAL | Status: AC
  Administered 2021-02-05: 15 g via ORAL
  Filled 2021-02-05: qty 60

## 2021-02-05 NOTE — Plan of Care (Signed)
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
## 2021-02-05 NOTE — Plan of Care (Signed)
  Problem: Education: Goal: Knowledge of General Education information will improve Description Including pain rating scale, medication(s)/side effects and non-pharmacologic comfort measures Outcome: Progressing   

## 2021-02-05 NOTE — Progress Notes (Signed)
Pt refused CPAP

## 2021-02-06 DIAGNOSIS — L089 Local infection of the skin and subcutaneous tissue, unspecified: Secondary | ICD-10-CM | POA: Diagnosis not present

## 2021-02-06 DIAGNOSIS — E11628 Type 2 diabetes mellitus with other skin complications: Secondary | ICD-10-CM | POA: Diagnosis not present

## 2021-02-06 LAB — BASIC METABOLIC PANEL
Anion gap: 5 (ref 5–15)
BUN: 34 mg/dL — ABNORMAL HIGH (ref 6–20)
CO2: 28 mmol/L (ref 22–32)
Calcium: 8.7 mg/dL — ABNORMAL LOW (ref 8.9–10.3)
Chloride: 105 mmol/L (ref 98–111)
Creatinine, Ser: 2.79 mg/dL — ABNORMAL HIGH (ref 0.61–1.24)
GFR, Estimated: 26 mL/min — ABNORMAL LOW (ref >60)
Glucose, Bld: 109 mg/dL — ABNORMAL HIGH (ref 70–99)
Potassium: 4.9 mmol/L (ref 3.5–5.1)
Sodium: 138 mmol/L (ref 135–145)

## 2021-02-06 LAB — GLUCOSE, CAPILLARY
Glucose-Capillary: 103 mg/dL — ABNORMAL HIGH (ref 70–99)
Glucose-Capillary: 98 mg/dL (ref 70–99)
Glucose-Capillary: 119 mg/dL — ABNORMAL HIGH (ref 70–99)
Glucose-Capillary: 108 mg/dL — ABNORMAL HIGH (ref 70–99)
Glucose-Capillary: 129 mg/dL — ABNORMAL HIGH (ref 70–99)

## 2021-02-06 MED ORDER — DOCUSATE SODIUM 100 MG PO CAPS
200.0000 mg | ORAL_CAPSULE | Freq: Two times a day (BID) | ORAL | Status: DC | PRN
  Administered 2021-02-06: 200 mg via ORAL
  Filled 2021-02-06: qty 2

## 2021-02-06 MED ORDER — DOCUSATE SODIUM 100 MG PO CAPS
200.0000 mg | ORAL_CAPSULE | Freq: Two times a day (BID) | ORAL | Status: DC
  Administered 2021-02-06 – 2021-02-07 (×2): 200 mg via ORAL
  Filled 2021-02-06 (×2): qty 2

## 2021-02-06 MED ORDER — SODIUM POLYSTYRENE SULFONATE 15 GM/60ML PO SUSP
15.0000 g | Freq: Once | ORAL | Status: AC
  Administered 2021-02-06: 15 g via ORAL
  Filled 2021-02-06: qty 60

## 2021-02-06 NOTE — Progress Notes (Signed)
Patient requested to speak with charge nurse stating he had been in pain and no one had addressed the pain all day. When RN went to check on patient he was sleeping and when asked if he needed pain medication he said no.   Patient also reported he had not been given stool softeners after RN had administered stool softeners and reminded patient three times that that he was being given a stool softener.   Patient also called his wife to report that his dressing change had not been done but patient had requested to be allowed to rest when RN was available to change the dressing.
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
## 2021-02-07 ENCOUNTER — Other Ambulatory Visit: Payer: Self-pay

## 2021-02-07 ENCOUNTER — Encounter (HOSPITAL_COMMUNITY): Payer: Self-pay | Admitting: Physical Medicine and Rehabilitation

## 2021-02-07 ENCOUNTER — Inpatient Hospital Stay (HOSPITAL_COMMUNITY)
Admission: RE | Admit: 2021-02-07 | Discharge: 2021-02-17 | DRG: 559 | Disposition: A | Discharge status: Home Health | Payer: No Typology Code available for payment source | Source: Intra-hospital | Attending: Physical Medicine and Rehabilitation | Admitting: Physical Medicine and Rehabilitation

## 2021-02-07 DIAGNOSIS — N183 Chronic kidney disease, stage 3 unspecified: Secondary | ICD-10-CM | POA: Diagnosis present

## 2021-02-07 DIAGNOSIS — G9341 Metabolic encephalopathy: Secondary | ICD-10-CM | POA: Diagnosis present

## 2021-02-07 DIAGNOSIS — I130 Hypertensive heart and chronic kidney disease with heart failure and stage 1 through stage 4 chronic kidney disease, or unspecified chronic kidney disease: Secondary | ICD-10-CM | POA: Diagnosis present

## 2021-02-07 DIAGNOSIS — I255 Ischemic cardiomyopathy: Secondary | ICD-10-CM | POA: Diagnosis present

## 2021-02-07 DIAGNOSIS — M25812 Other specified joint disorders, left shoulder: Secondary | ICD-10-CM | POA: Diagnosis present

## 2021-02-07 DIAGNOSIS — I5022 Chronic systolic (congestive) heart failure: Secondary | ICD-10-CM | POA: Diagnosis present

## 2021-02-07 DIAGNOSIS — I739 Peripheral vascular disease, unspecified: Secondary | ICD-10-CM

## 2021-02-07 DIAGNOSIS — I5043 Acute on chronic combined systolic (congestive) and diastolic (congestive) heart failure: Secondary | ICD-10-CM | POA: Diagnosis not present

## 2021-02-07 DIAGNOSIS — E875 Hyperkalemia: Secondary | ICD-10-CM | POA: Diagnosis present

## 2021-02-07 DIAGNOSIS — G8918 Other acute postprocedural pain: Secondary | ICD-10-CM | POA: Diagnosis not present

## 2021-02-07 DIAGNOSIS — E1122 Type 2 diabetes mellitus with diabetic chronic kidney disease: Secondary | ICD-10-CM | POA: Diagnosis present

## 2021-02-07 DIAGNOSIS — G4733 Obstructive sleep apnea (adult) (pediatric): Secondary | ICD-10-CM | POA: Diagnosis present

## 2021-02-07 DIAGNOSIS — R4189 Other symptoms and signs involving cognitive functions and awareness: Secondary | ICD-10-CM | POA: Diagnosis present

## 2021-02-07 DIAGNOSIS — S88119A Complete traumatic amputation at level between knee and ankle, unspecified lower leg, initial encounter: Secondary | ICD-10-CM

## 2021-02-07 DIAGNOSIS — Z89511 Acquired absence of right leg below knee: Secondary | ICD-10-CM | POA: Diagnosis not present

## 2021-02-07 DIAGNOSIS — H269 Unspecified cataract: Secondary | ICD-10-CM | POA: Diagnosis present

## 2021-02-07 DIAGNOSIS — D631 Anemia in chronic kidney disease: Secondary | ICD-10-CM | POA: Diagnosis present

## 2021-02-07 DIAGNOSIS — E669 Obesity, unspecified: Secondary | ICD-10-CM | POA: Diagnosis not present

## 2021-02-07 DIAGNOSIS — D72829 Elevated white blood cell count, unspecified: Secondary | ICD-10-CM | POA: Diagnosis not present

## 2021-02-07 DIAGNOSIS — Z8616 Personal history of COVID-19: Secondary | ICD-10-CM | POA: Diagnosis not present

## 2021-02-07 DIAGNOSIS — I214 Non-ST elevation (NSTEMI) myocardial infarction: Secondary | ICD-10-CM | POA: Diagnosis present

## 2021-02-07 DIAGNOSIS — E1169 Type 2 diabetes mellitus with other specified complication: Secondary | ICD-10-CM | POA: Diagnosis not present

## 2021-02-07 DIAGNOSIS — N179 Acute kidney failure, unspecified: Secondary | ICD-10-CM | POA: Diagnosis present

## 2021-02-07 DIAGNOSIS — K219 Gastro-esophageal reflux disease without esophagitis: Secondary | ICD-10-CM | POA: Diagnosis not present

## 2021-02-07 DIAGNOSIS — Z4781 Encounter for orthopedic aftercare following surgical amputation: Principal | ICD-10-CM

## 2021-02-07 DIAGNOSIS — K5900 Constipation, unspecified: Secondary | ICD-10-CM | POA: Diagnosis present

## 2021-02-07 DIAGNOSIS — I10 Essential (primary) hypertension: Secondary | ICD-10-CM | POA: Diagnosis present

## 2021-02-07 DIAGNOSIS — R109 Unspecified abdominal pain: Secondary | ICD-10-CM

## 2021-02-07 DIAGNOSIS — E118 Type 2 diabetes mellitus with unspecified complications: Secondary | ICD-10-CM | POA: Diagnosis present

## 2021-02-07 LAB — BASIC METABOLIC PANEL
Anion gap: 6 (ref 5–15)
BUN: 34 mg/dL — ABNORMAL HIGH (ref 6–20)
CO2: 27 mmol/L (ref 22–32)
Calcium: 8.8 mg/dL — ABNORMAL LOW (ref 8.9–10.3)
Chloride: 104 mmol/L (ref 98–111)
Creatinine, Ser: 2.47 mg/dL — ABNORMAL HIGH (ref 0.61–1.24)
GFR, Estimated: 31 mL/min — ABNORMAL LOW (ref >60)
Glucose, Bld: 132 mg/dL — ABNORMAL HIGH (ref 70–99)
Potassium: 4.9 mmol/L (ref 3.5–5.1)
Sodium: 137 mmol/L (ref 135–145)

## 2021-02-07 LAB — GLUCOSE, CAPILLARY
Glucose-Capillary: 101 mg/dL — ABNORMAL HIGH (ref 70–99)
Glucose-Capillary: 109 mg/dL — ABNORMAL HIGH (ref 70–99)
Glucose-Capillary: 138 mg/dL — ABNORMAL HIGH (ref 70–99)
Glucose-Capillary: 160 mg/dL — ABNORMAL HIGH (ref 70–99)
Glucose-Capillary: 161 mg/dL — ABNORMAL HIGH (ref 70–99)
Glucose-Capillary: 91 mg/dL (ref 70–99)

## 2021-02-07 MED ORDER — GUAIFENESIN-DM 100-10 MG/5ML PO SYRP: 5.0000 mL | ORAL_SOLUTION | Freq: Four times a day (QID) | ORAL | Status: DC | PRN

## 2021-02-07 MED ORDER — PHENOL 1.4 % MT LIQD: 1.0000 | OROMUCOSAL | Status: DC | PRN

## 2021-02-07 MED ORDER — POLYETHYLENE GLYCOL 3350 17 G PO PACK
17.0000 g | PACK | Freq: Every day | ORAL | Status: DC | PRN
  Administered 2021-02-07 – 2021-02-16 (×2): 17 g via ORAL
  Filled 2021-02-07 (×3): qty 1

## 2021-02-07 MED ORDER — NEPRO/CARBSTEADY PO LIQD
237.0000 mL | Freq: Every day | ORAL | Status: DC
  Administered 2021-02-07 – 2021-02-12 (×6): 237 mL via ORAL

## 2021-02-07 MED ORDER — ACETAMINOPHEN 325 MG PO TABS: 325.0000 mg | ORAL_TABLET | ORAL | Status: DC | PRN

## 2021-02-07 MED ORDER — HYDROCERIN EX CREA
1.0000 "application " | TOPICAL_CREAM | Freq: Two times a day (BID) | CUTANEOUS | Status: DC
  Administered 2021-02-08 – 2021-02-17 (×16): 1 via TOPICAL
  Filled 2021-02-07: qty 113

## 2021-02-07 MED ORDER — PROCHLORPERAZINE MALEATE 5 MG PO TABS
5.0000 mg | ORAL_TABLET | Freq: Four times a day (QID) | ORAL | Status: DC | PRN
  Administered 2021-02-08: 10 mg via ORAL
  Filled 2021-02-07: qty 2

## 2021-02-07 MED ORDER — FLEET ENEMA 7-19 GM/118ML RE ENEM: 1.0000 | ENEMA | Freq: Once | RECTAL | Status: DC | PRN

## 2021-02-07 MED ORDER — PROCHLORPERAZINE 25 MG RE SUPP: 12.5000 mg | Freq: Four times a day (QID) | RECTAL | Status: DC | PRN

## 2021-02-07 MED ORDER — ZINC SULFATE 220 (50 ZN) MG PO CAPS
220.0000 mg | ORAL_CAPSULE | Freq: Every day | ORAL | Status: DC
  Administered 2021-02-07 – 2021-02-08 (×2): 220 mg via ORAL
  Filled 2021-02-07 (×2): qty 1

## 2021-02-07 MED ORDER — ENOXAPARIN SODIUM 40 MG/0.4ML IJ SOSY: 40.0000 mg | PREFILLED_SYRINGE | INTRAMUSCULAR | Status: DC

## 2021-02-07 MED ORDER — TORSEMIDE 20 MG PO TABS
10.0000 mg | ORAL_TABLET | Freq: Every day | ORAL | Status: DC
  Administered 2021-02-08 – 2021-02-17 (×10): 10 mg via ORAL
  Filled 2021-02-07 (×10): qty 1

## 2021-02-07 MED ORDER — ROSUVASTATIN CALCIUM 20 MG PO TABS
20.0000 mg | ORAL_TABLET | Freq: Every day | ORAL | Status: DC
  Administered 2021-02-08 – 2021-02-17 (×10): 20 mg via ORAL
  Filled 2021-02-07 (×10): qty 1

## 2021-02-07 MED ORDER — MELATONIN 5 MG PO TABS
5.0000 mg | ORAL_TABLET | Freq: Every evening | ORAL | Status: DC | PRN
  Administered 2021-02-15 – 2021-02-17 (×2): 5 mg via ORAL
  Filled 2021-02-07 (×2): qty 1

## 2021-02-07 MED ORDER — LIVING WELL WITH DIABETES BOOK
Freq: Once | Status: AC
  Filled 2021-02-07: qty 1

## 2021-02-07 MED ORDER — ASPIRIN EC 81 MG PO TBEC
81.0000 mg | DELAYED_RELEASE_TABLET | Freq: Every day | ORAL | Status: DC
  Administered 2021-02-08 – 2021-02-17 (×10): 81 mg via ORAL
  Filled 2021-02-07 (×10): qty 1

## 2021-02-07 MED ORDER — INSULIN ASPART 100 UNIT/ML IJ SOLN
0.0000 [IU] | INTRAMUSCULAR | Status: DC
  Administered 2021-02-07: 2 [IU] via SUBCUTANEOUS
  Administered 2021-02-07 – 2021-02-08 (×2): 1 [IU] via SUBCUTANEOUS

## 2021-02-07 MED ORDER — POLYSACCHARIDE IRON COMPLEX 150 MG PO CAPS
150.0000 mg | ORAL_CAPSULE | Freq: Two times a day (BID) | ORAL | Status: DC
  Administered 2021-02-07 – 2021-02-08 (×3): 150 mg via ORAL
  Filled 2021-02-07 (×3): qty 1

## 2021-02-07 MED ORDER — CARVEDILOL 6.25 MG PO TABS
6.2500 mg | ORAL_TABLET | Freq: Two times a day (BID) | ORAL | Status: DC
  Administered 2021-02-07 – 2021-02-17 (×20): 6.25 mg via ORAL
  Filled 2021-02-07 (×19): qty 1

## 2021-02-07 MED ORDER — METHOCARBAMOL 500 MG PO TABS
500.0000 mg | ORAL_TABLET | Freq: Four times a day (QID) | ORAL | Status: DC | PRN
  Administered 2021-02-12: 500 mg via ORAL
  Filled 2021-02-07: qty 1

## 2021-02-07 MED ORDER — PANTOPRAZOLE SODIUM 40 MG PO TBEC
40.0000 mg | DELAYED_RELEASE_TABLET | Freq: Every day | ORAL | Status: DC
  Administered 2021-02-08 – 2021-02-17 (×10): 40 mg via ORAL
  Filled 2021-02-07 (×11): qty 1

## 2021-02-07 MED ORDER — JUVEN PO PACK
1.0000 | PACK | Freq: Two times a day (BID) | ORAL | Status: DC
  Administered 2021-02-07 – 2021-02-17 (×18): 1 via ORAL
  Filled 2021-02-07 (×21): qty 1

## 2021-02-07 MED ORDER — EXERCISE FOR HEART AND HEALTH BOOK
Freq: Once | Status: AC
  Filled 2021-02-07: qty 1

## 2021-02-07 MED ORDER — OXYCODONE-ACETAMINOPHEN 7.5-325 MG PO TABS
1.0000 | ORAL_TABLET | ORAL | Status: DC | PRN
Start: 2021-02-07 — End: 2021-02-17
  Administered 2021-02-08 – 2021-02-17 (×14): 1 via ORAL
  Filled 2021-02-07 (×15): qty 1

## 2021-02-07 MED ORDER — ADULT MULTIVITAMIN W/MINERALS CH
1.0000 | ORAL_TABLET | Freq: Every day | ORAL | Status: DC
  Administered 2021-02-08 – 2021-02-17 (×10): 1 via ORAL
  Filled 2021-02-07 (×10): qty 1

## 2021-02-07 MED ORDER — PROCHLORPERAZINE EDISYLATE 10 MG/2ML IJ SOLN: 5.0000 mg | Freq: Four times a day (QID) | INTRAMUSCULAR | Status: DC | PRN

## 2021-02-07 MED ORDER — INSULIN GLARGINE 100 UNIT/ML ~~LOC~~ SOLN
20.0000 [IU] | Freq: Two times a day (BID) | SUBCUTANEOUS | Status: DC
  Administered 2021-02-07 – 2021-02-16 (×19): 20 [IU] via SUBCUTANEOUS
  Filled 2021-02-07 (×22): qty 0.2

## 2021-02-07 MED ORDER — ALUM & MAG HYDROXIDE-SIMETH 200-200-20 MG/5ML PO SUSP
30.0000 mL | ORAL | Status: DC | PRN
  Administered 2021-02-08 – 2021-02-14 (×5): 30 mL via ORAL
  Filled 2021-02-07 (×5): qty 30

## 2021-02-07 MED ORDER — ENOXAPARIN SODIUM 60 MG/0.6ML IJ SOSY
60.0000 mg | PREFILLED_SYRINGE | INTRAMUSCULAR | Status: DC
  Administered 2021-02-07 – 2021-02-12 (×6): 60 mg via SUBCUTANEOUS
  Filled 2021-02-07 (×8): qty 0.6

## 2021-02-07 MED ORDER — ASCORBIC ACID 500 MG PO TABS
250.0000 mg | ORAL_TABLET | Freq: Every day | ORAL | Status: DC
  Administered 2021-02-08 – 2021-02-17 (×10): 250 mg via ORAL
  Filled 2021-02-07 (×10): qty 1

## 2021-02-07 MED ORDER — BISACODYL 10 MG RE SUPP
10.0000 mg | Freq: Every day | RECTAL | Status: DC | PRN
  Administered 2021-02-07: 10 mg via RECTAL
  Filled 2021-02-07 (×2): qty 1

## 2021-02-07 MED ORDER — DIPHENHYDRAMINE HCL 12.5 MG/5ML PO ELIX: 12.5000 mg | ORAL_SOLUTION | Freq: Four times a day (QID) | ORAL | Status: DC | PRN

## 2021-02-07 NOTE — Progress Notes (Signed)
Living with diabetes, and exercise book given and discussed with pt. Pt hs no further questions. Booklets left at bedside.  Mylo Red, LPN
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
## 2021-02-07 NOTE — Progress Notes (Signed)
PT Cancellation Note  Patient Details Name: Mitchell [name] MRN: [id number] DOB: May 29, 1971   Cancelled Treatment:    Reason Eval/Treat Not Completed: Other (comment) per RN, pt imminently transferring to [facility]. Holding for now. Thank you for the opportunity to participate in his care!   Madelaine Etienne, DPT, PN1   Supplemental Physical Therapist [facility]    Pager [phone] Acute Rehab Office [phone]

## 2021-02-07 NOTE — Progress Notes (Signed)
Inpatient Rehabilitation Medication Review by a Pharmacist  A complete drug regimen review was completed for this patient to identify any potential clinically significant medication issues.  Clinically significant medication issues were identified:  yes   Type of Medication Issue Identified Description of Issue Urgent (address now) Non-Urgent (address on AM team rounds) Plan Plan Accepted by Provider? (Yes / No / Pending AM Rounds)  Drug Interaction(s) (clinically significant)       Duplicate Therapy       Allergy       No Medication Administration End Date       Incorrect Dose       Additional Drug Therapy Needed  Docusate and senna not added as per Admission H&P Non-urgent Address in AM rounds   Other         Name of provider notified for urgent issues identified: AM rounds  Provider Method of Notification: N/A   For non-urgent medication issues to be resolved on team rounds tomorrow morning a [facility] Secure Chat Handoff was sent to:    Pharmacist comments:   Time spent performing this drug regimen review (minutes):  10   Tad Moore 02/07/2021 5:38 PM
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
## 2021-02-07 NOTE — Progress Notes (Signed)
Inpatient Rehab Admissions Coordinator:   I have a bed for this pt. On CIR and plan to admit today. RN may call report  after 12 pm to [phone].  Megan Salon, MS, CCC-SLP Rehab Admissions Coordinator  [phone] (celll) [phone] (office)

## 2021-02-07 NOTE — Progress Notes (Addendum)
Pt arrived to 4M11 per bed. All belongings at bedside. Pt oriented to rehab, policies reviewed. Vitals obtained, assessment complete.  Mylo Red, LPN
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
## 2021-02-08 ENCOUNTER — Telehealth: Payer: Self-pay | Admitting: Radiology

## 2021-02-08 ENCOUNTER — Inpatient Hospital Stay (HOSPITAL_COMMUNITY): Payer: No Typology Code available for payment source

## 2021-02-08 DIAGNOSIS — Z89511 Acquired absence of right leg below knee: Secondary | ICD-10-CM

## 2021-02-08 LAB — CBC WITH DIFFERENTIAL/PLATELET
Abs Immature Granulocytes: 0.07 10*3/uL (ref 0.00–0.07)
Basophils Absolute: 0 10*3/uL (ref 0.0–0.1)
Basophils Relative: 0 %
Eosinophils Absolute: 0.1 10*3/uL (ref 0.0–0.5)
Eosinophils Relative: 1 %
HCT: 31 % — ABNORMAL LOW (ref 39.0–52.0)
Hemoglobin: 9.8 g/dL — ABNORMAL LOW (ref 13.0–17.0)
Immature Granulocytes: 1 %
Lymphocytes Relative: 16 %
Lymphs Abs: 1.4 10*3/uL (ref 0.7–4.0)
MCH: 29.5 pg (ref 26.0–34.0)
MCHC: 31.6 g/dL (ref 30.0–36.0)
MCV: 93.4 fL (ref 80.0–100.0)
Monocytes Absolute: 0.7 10*3/uL (ref 0.1–1.0)
Monocytes Relative: 8 %
Neutro Abs: 6.4 10*3/uL (ref 1.7–7.7)
Neutrophils Relative %: 74 %
Platelets: 307 10*3/uL (ref 150–400)
RBC: 3.32 MIL/uL — ABNORMAL LOW (ref 4.22–5.81)
RDW: 13 % (ref 11.5–15.5)
WBC: 8.6 10*3/uL (ref 4.0–10.5)
nRBC: 0 % (ref 0.0–0.2)

## 2021-02-08 LAB — COMPREHENSIVE METABOLIC PANEL
ALT: 15 U/L (ref 0–44)
AST: 21 U/L (ref 15–41)
Albumin: 2.3 g/dL — ABNORMAL LOW (ref 3.5–5.0)
Alkaline Phosphatase: 48 U/L (ref 38–126)
Anion gap: 6 (ref 5–15)
BUN: 32 mg/dL — ABNORMAL HIGH (ref 6–20)
CO2: 27 mmol/L (ref 22–32)
Calcium: 8.7 mg/dL — ABNORMAL LOW (ref 8.9–10.3)
Chloride: 106 mmol/L (ref 98–111)
Creatinine, Ser: 2.16 mg/dL — ABNORMAL HIGH (ref 0.61–1.24)
GFR, Estimated: 36 mL/min — ABNORMAL LOW (ref >60)
Glucose, Bld: 99 mg/dL (ref 70–99)
Potassium: 4.5 mmol/L (ref 3.5–5.1)
Sodium: 139 mmol/L (ref 135–145)
Total Bilirubin: 0.6 mg/dL (ref 0.3–1.2)
Total Protein: 7.6 g/dL (ref 6.5–8.1)

## 2021-02-08 LAB — GLUCOSE, CAPILLARY
Glucose-Capillary: 109 mg/dL — ABNORMAL HIGH (ref 70–99)
Glucose-Capillary: 140 mg/dL — ABNORMAL HIGH (ref 70–99)
Glucose-Capillary: 147 mg/dL — ABNORMAL HIGH (ref 70–99)
Glucose-Capillary: 83 mg/dL (ref 70–99)
Glucose-Capillary: 84 mg/dL (ref 70–99)
Glucose-Capillary: 97 mg/dL (ref 70–99)

## 2021-02-08 LAB — MAGNESIUM: Magnesium: 1.7 mg/dL (ref 1.7–2.4)

## 2021-02-08 MED ORDER — SILVER SULFADIAZINE 1 % EX CREA
1.0000 "application " | TOPICAL_CREAM | Freq: Two times a day (BID) | CUTANEOUS | Status: DC
  Administered 2021-02-08 – 2021-02-16 (×13): 1 via TOPICAL
  Filled 2021-02-08: qty 85

## 2021-02-08 MED ORDER — SENNA 8.6 MG PO TABS
2.0000 | ORAL_TABLET | Freq: Every day | ORAL | Status: DC
  Administered 2021-02-09 – 2021-02-16 (×7): 17.2 mg via ORAL
  Filled 2021-02-08 (×9): qty 2

## 2021-02-08 MED ORDER — SIMETHICONE 80 MG PO CHEW: 160.0000 mg | CHEWABLE_TABLET | Freq: Four times a day (QID) | ORAL | Status: DC | PRN

## 2021-02-08 MED ORDER — SENNOSIDES-DOCUSATE SODIUM 8.6-50 MG PO TABS
2.0000 | ORAL_TABLET | Freq: Every day | ORAL | Status: DC
  Administered 2021-02-08: 2 via ORAL
  Filled 2021-02-08: qty 2

## 2021-02-08 MED ORDER — MAGNESIUM OXIDE -MG SUPPLEMENT 400 (240 MG) MG PO TABS
400.0000 mg | ORAL_TABLET | Freq: Every day | ORAL | Status: DC
  Administered 2021-02-08: 400 mg via ORAL
  Filled 2021-02-08: qty 1

## 2021-02-08 MED ORDER — DOCUSATE SODIUM 100 MG PO CAPS
100.0000 mg | ORAL_CAPSULE | Freq: Three times a day (TID) | ORAL | Status: DC
  Administered 2021-02-08 – 2021-02-17 (×26): 100 mg via ORAL
  Filled 2021-02-08 (×27): qty 1

## 2021-02-08 MED ORDER — INSULIN ASPART 100 UNIT/ML IJ SOLN
0.0000 [IU] | Freq: Three times a day (TID) | INTRAMUSCULAR | Status: DC
  Administered 2021-02-09: 2 [IU] via SUBCUTANEOUS
  Administered 2021-02-10 – 2021-02-16 (×9): 1 [IU] via SUBCUTANEOUS
  Administered 2021-02-17: 2 [IU] via SUBCUTANEOUS

## 2021-02-08 NOTE — Progress Notes (Signed)
Physical Therapy Session Note  Patient Details  Name: Mitchell [name] MRN: [id number] Date of Birth: [birth year]  Today's Date: 02/08/2021 PT Individual Time: 1400-1511 PT Individual Time Calculation (min): 71 min   Short Term Goals: Week 1:  PT Short Term Goal 1 (Week 1): STG = LTG due to LOS  Skilled Therapeutic Interventions/Progress Updates:   Received pt semi-reclined in bed asleep with lights off, upon wakening pt agreeable to PT treatment, and reported "low" pain but did not state level and declined pain interventions. Pt reported PA recently in to re-wrap residual limb due to excessive bleeding and drainage. Consulted with PA, Pam, who recommended therapist don shrinker over ace wrap to assist with edema. Donned shrinker and L shoe with total A and transferred semi-reclined<>sitting EOB with supervision and donned pull over shirt with set up assist. Squat<>pivot bed<>WC with min A. Pt performed WC mobility 12ft using BUE and supervision to ortho gym. Pt performed simulated car transfer squat<>pivot with min A and cues for safety/technique as pt is impulsive. Pt then performed WC mobility 43ft x 2 trials on uneven surfaces (ramp) with CGA on trial 1 and supervision on trial 2 with emphasis on UE strengthening. Pt then reported urge to void and transported back to room and able to void in urinal with supervision. Attempted to locate [facility] gloves for pt for hand protection however none available. Pt transported to dayroom in [facility] total A and transferred sit<>stand with RW and min A x 2 trials. Worked on dynamic standing balance playing cornhole using LUE x 2 trials with min A for balance. Pt initially very fearful upon attempting activity stating "I just need a minute to wrap my head around this" but after standing reported that the activity was "easy". Pt transported back to room in [facility] total A and transferred WC<>bed squat<>pivot to R with min A and sit<>supine with supervision. Concluded session with pt  supine in bed, needs within reach, and bed alarm on. R residual limb elevated on pillow for edema management.   Therapy Documentation Precautions:  Restrictions Weight Bearing Restrictions: Yes RLE Weight Bearing: Non weight bearing  Therapy/Group: Individual Therapy Martin Majestic PT, DPT   02/08/2021, 7:22 AM

## 2021-02-08 NOTE — Patient Care Conference (Signed)
Inpatient RehabilitationTeam Conference and Plan of Care Update Date: 02/08/2021   Time: 13:42 PM    Patient Name: Mitchell [name]      Medical Record Number: [id number]  Date of Birth: 18-Mar-1971 Sex: Male         Room/Bed: 4M11C/4M11C-01 Payor Info: Payor: Advertising copywriter / Plan: [employer] / Product Type: *No Product type* /    Admit Date/Time:  02/07/2021  3:55 PM  Primary Diagnosis:  S/P below knee amputation, right [facility])  Hospital Problems: Principal Problem:   S/P below knee amputation, right [facility])    Expected Discharge Date: Expected Discharge Date: 02/17/21  Team Members Present: Physician leading conference: Dr. Sula Soda Care Coodinator Present: Chana Bode, RN, BSN, CRRN;[place]ina [place], BSW Nurse Present: Chana Bode, RN PT Present: Raechel Chute, PT OT Present: Rosalio Loud, OT     Current Status/Progress Goal Weekly Team Focus  Bowel/Bladder             Swallow/Nutrition/ Hydration             ADL's   UB bathing CGA sitting EOB, LB bathing CGA sitting EOB, CGA for UB dressing, Mod A + 2 for LB dressing, dependent for footwear  Supervision  Reduce impulsitivty during ADLs, endurance, limb protection during functional transfers, sit > stand for ADLs   Mobility   Mod A for transfers, gait; independent with bed mobility and WC  Supervision  Transfers, gait training, stair training, endurance, residual limb care   Communication             Safety/Cognition/ Behavioral Observations            Pain             Skin               Discharge Planning:      Team Discussion: Blister on incision line but appears to be healing. Constipation issues addressed. Magnesium deficiency supplemented and AKI and ABLA stable per MD.   Patient on target to meet rehab goals: yes, currently mod assist for transfers and gait. CGA for UB care and requires help for lower body and bathing. Dependent for footwear and don/doff limb guard. Mod assist +2 for transfers and  gait due to posterior leaning. Supervision goals set for discharge  *See Care Plan and progress notes for long and short-term goals.   Revisions to Treatment Plan:  Working on steps, endurance   Teaching Needs: Limb care/protection, medications, transfers, etc  Current Barriers to Discharge: Decreased caregiver support and Home enviroment access/layout  Possible Resolutions to Barriers: Family education with wife; supervision level assist at discharge only     Medical Summary Current Status: impulsive, right BKA with blister and drainage, opioid induced constipation, phantom limb sensation  Barriers to Discharge: Behavior;Medical stability;Wound care  Barriers to Discharge Comments: impulsive, right BKA with blister and drainage, opioid induced constipation, phantom limb sensation Possible Resolutions to [employer] Focus: monitor wound daily, education regarding limb protection, start colace TID and senns 2 tabs HS, educated regarding phantom limb sensation   Continued Need for Acute Rehabilitation Level of Care: The patient requires daily medical management by a physician with specialized training in physical medicine and rehabilitation for the following reasons: Direction of a multidisciplinary physical rehabilitation program to maximize functional independence : Yes Medical management of patient stability for increased activity during participation in an intensive rehabilitation regime.: Yes Analysis of laboratory values and/or radiology reports with any subsequent need for medication adjustment and/or medical  intervention. : Yes   I attest that I was present, lead the team conference, and concur with the assessment and plan of the team.   Chana Bode B 02/08/2021, 2:09 PM

## 2021-02-08 NOTE — Progress Notes (Signed)
Inpatient Rehabilitation  Patient information reviewed and entered into eRehab system by Eliabeth Shoff [place] Deleo, OTR/L.   Information including medical coding, functional ability and quality indicators will be reviewed and updated through discharge.    

## 2021-02-08 NOTE — Telephone Encounter (Signed)
Received voicemail from Delle Reining, [facility]. Patient was admitted to them yesterday. Today during dressing change, she noticed bloody drainage, what appears to be one large ruptured blister, and multiple listers on the flap of his BKA. Rinaldo Cloud also feels that he may have a hematoma laterally. She would like for [place] to take a look and let her know if she needs to do something different for dressing changes, possible silvadene?  Please call Rinaldo Cloud to advise.  [phone]

## 2021-02-08 NOTE — Progress Notes (Signed)
Discussed patient with Devonne Doughty (Dr. Audrie Lia PA)--she has reviewed the pictures and recommends compression for edema control which will decrease risk for blistering (to try to get shrinker over stump dressing if able) as well as thin layer of silvadene to ruptured blister. She will come by on Wednesday to evaluate patient

## 2021-02-08 NOTE — Progress Notes (Signed)
Inpatient Rehabilitation Center Individual Statement of Services  Patient Name:  Mitchell [name]  Date:  02/08/2021  Welcome to the Inpatient Rehabilitation Center.  Our goal is to provide you with an individualized program based on your diagnosis and situation, designed to meet your specific needs.  With this comprehensive rehabilitation program, you will be expected to participate in at least 3 hours of rehabilitation therapies Monday-Friday, with modified therapy programming on the weekends.  Your rehabilitation program will include the following services:  Physical Therapy (PT), Occupational Therapy (OT), Speech Therapy (ST), 24 hour per day rehabilitation nursing, Therapeutic Recreaction (TR), Neuropsychology, Care Coordinator, Rehabilitation Medicine, Nutrition Services, Pharmacy Services, and Other  Weekly team conferences will be held on Tuesdays to discuss your progress.  Your Inpatient Rehabilitation Care Coordinator will talk with you frequently to get your input and to update you on team discussions.  Team conferences with you and your family in attendance may also be held.  Expected length of stay: 18-21 Days  Overall anticipated outcome: MinA  Depending on your progress and recovery, your program may change. Your Inpatient Rehabilitation Care Coordinator will coordinate services and will keep you informed of any changes. Your Inpatient Rehabilitation Care Coordinator's name and contact numbers are listed  below.  The following services may also be recommended but are not provided by the Inpatient Rehabilitation Center:   Home Health Rehabiltiation Services Outpatient Rehabilitation Services    Arrangements will be made to provide these services after discharge if needed.  Arrangements include referral to agencies that provide these services.  Your insurance has been verified to be:  [facility]  Your primary doctor is:  Abbe Amsterdam, MD  Pertinent information will be shared with  your doctor and your insurance company.  Inpatient Rehabilitation Care Coordinator:  Lavera Guise, [place] [phone] or [phone]  Information discussed with and copy given to patient by: Andria Rhein, 02/08/2021, 12:26 PM

## 2021-02-08 NOTE — Progress Notes (Signed)
Patient ID: Mitchell [name], male   DOB: [birth year], 49 y.o.   MRN: [id number] Met with the patient to introduce self and the role of the nurse CM. Reviewed skin care and incision care, DM management and HF. Continue to follow along to discharge to review educational needs and facilitate preparation for discharge. .me
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
## 2021-02-08 NOTE — Progress Notes (Signed)
PA Pam notified about pt upset stomach and nausea and SOB. New orders received.  Mylo Red, LPN
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
## 2021-02-08 NOTE — Progress Notes (Signed)
Orthopedic Tech Progress Note Patient Details:  Mitchell [name] March 12, 1971 [id number]  Spoke with RN about patient needing an "AMPUTEE PAD" to elevate stump while in chair. called to [facility] and they stated " they do not carry those nor have an idea of what i'm asking for" called floor and told them what [facility] had said.  Patient ID: Mitchell [name], male   DOB: [birth year], 50 y.o.   MRN: [id number]  [name] 02/08/2021, 4:09 PM
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
## 2021-02-08 NOTE — Progress Notes (Signed)
Patient ID: Mitchell [name], male   DOB: Apr 13, 1971, 50 y.o.   MRN: [id number] Team Conference Report to Patient/Family  Team Conference discussion was reviewed with the patient and caregiver, including goals, any changes in plan of care and target discharge date.  Patient and caregiver express understanding and are in agreement.  The patient has a target discharge date of 02/17/21.  SW made attempt to call spouse to provide conference info, sw will follow up  Andria Rhein 02/08/2021, 2:32 PM

## 2021-02-09 DIAGNOSIS — Z89511 Acquired absence of right leg below knee: Secondary | ICD-10-CM | POA: Diagnosis not present

## 2021-02-09 LAB — GLUCOSE, CAPILLARY
Glucose-Capillary: 79 mg/dL (ref 70–99)
Glucose-Capillary: 87 mg/dL (ref 70–99)
Glucose-Capillary: 155 mg/dL — ABNORMAL HIGH (ref 70–99)
Glucose-Capillary: 125 mg/dL — ABNORMAL HIGH (ref 70–99)

## 2021-02-09 LAB — MAGNESIUM: Magnesium: 1.9 mg/dL (ref 1.7–2.4)

## 2021-02-09 MED ORDER — SIMETHICONE 80 MG PO CHEW: 160.0000 mg | CHEWABLE_TABLET | Freq: Four times a day (QID) | ORAL | Status: DC

## 2021-02-09 MED ORDER — SIMETHICONE 80 MG PO CHEW
80.0000 mg | CHEWABLE_TABLET | Freq: Four times a day (QID) | ORAL | Status: DC
  Administered 2021-02-09 – 2021-02-17 (×31): 80 mg via ORAL
  Filled 2021-02-09 (×31): qty 1

## 2021-02-09 NOTE — Progress Notes (Signed)
Occupational Therapy Session Note  Patient Details  Name: Mitchell [name] MRN: [id number] Date of Birth: [birth year]  Today's Date: 02/09/2021 OT Individual Time: 2751-7001 OT Individual Time Calculation (min): 12 min  and Today's Date: 02/09/2021 OT Missed Time: 33 Minutes Missed Time Reason: Pain   Short Term Goals: Week 1:  OT Short Term Goal 1 (Week 1): Pt will donn 1 LB dressing using adpative equipment with Min A OT Short Term Goal 2 (Week 1): Pt will complete sit > stand transfers during self-care tasks with CGA OT Short Term Goal 3 (Week 1): Pt will complete stand pivot toilet transfer with Min A +1 OT Short Term Goal 4 (Week 1): Pt will complete 2 grooming tasks while standing at the sink with CGA  Skilled Therapeutic Interventions/Progress Updates:    Limited treatment session secondary to pt reports of abdominal pain and nausea.  Pt received upright in w/c requesting to transfer back to bed. Pt reports feeling nauseous and doing well during PT but not feeling well.  Pt requesting to transfer back to bed and unwilling to engage in any therapeutic activity or self-care tasks prior to returning to bed.  Pt completed lateral scoot - squat pivot transfer min assist and then transferred to sidelying with supervision.  Pt remained in sidelying with all needs in reach.  Pt missed 33 mins due to abdominal pain and nausea.    Therapy Documentation Precautions:  Precautions Precautions: Fall Precaution Comments: new R BKA NWB, off COVID precautions Required Braces or Orthoses: Other Brace (Limb guard) Other Brace: Residual limb protector. Restrictions Weight Bearing Restrictions: Yes RLE Weight Bearing: Non weight bearing General: General OT Amount of Missed Time: 33 Minutes  Pain:  Pt with c/o pain in stomach, requesting to return to bed.  RN aware and already premedicated.   Therapy/Group: Individual Therapy  Rosalio Loud 02/09/2021, 12:07 PM

## 2021-02-09 NOTE — Progress Notes (Signed)
Physical Therapy Session Note  Patient Details  Name: Mitchell [name] MRN: [id number] Date of Birth: February 18, 1971  Today's Date: 02/09/2021 PT Individual Time: 1000-1100 PT Individual Time Calculation (min): 60   Short Term Goals: Week 1:  PT Short Term Goal 1 (Week 1): STG = LTG due to LOS  Skilled Therapeutic Interventions/Progress Updates:  Pt received supine in bed. No reports of pain and agreeable to PT. Emphasis of session on [facility] management,improved transfers, pre-gait training, and safety awareness. Pt very agitated about his experience with nursing staff the night prior and required lots of redirecting to focus on therapy session. Pt performed supine <> sitting EOB w/supervision. At EOB, dressed UE independently. Sit <> stand w/RW and min A for trunk support. Verbal cues provided for hand placement. Pt required min A for LE dressing. Stand pivot transfer to [facility] w/ min A for steadying assist. Pt self-propelled in WC from room to main gym w/supervision.In // bars w/BUE support,  performed 2x15 reps of 4-way hip (add, abd, flex, ext) of RLE. Required several sitting rest breaks in between sets. Gait training in // bars with min A. Pt was able to take 3 steps, turn and take 2 more steps for 3 trials w/BUE support on rails. Pt's confidence improved with each trial. Pt very leery of therapy and requires encouragement and praise to participate. Pt was left sitting in WC in room with all needs in reach.   Therapy Documentation Precautions:  Precautions Precautions: Fall Precaution Comments: new R BKA NWB, off COVID precautions Required Braces or Orthoses: Other Brace (Limb guard) Other Brace: Residual limb protector. Restrictions Weight Bearing Restrictions: Yes RLE Weight Bearing: Non weight bearing   Therapy/Group: Individual Therapy  Payslie Mccaig E Anaston Koehn Jill Alexanders Patrik Turnbaugh, PT, DPT  02/09/2021, 4:11 PM

## 2021-02-09 NOTE — Progress Notes (Signed)
Physical Therapy Session Note  Patient Details  Name: Mitchell [name] MRN: [id number] Date of Birth: 1971/07/28  Today's Date: 02/09/2021 PT Individual Time: 0900-0928 PT Individual Time Calculation (min): 28 min   Short Term Goals: Week 1:  PT Short Term Goal 1 (Week 1): STG = LTG due to LOS  Skilled Therapeutic Interventions/Progress Updates:   Received pt semi-reclined in bed with RN present administering mediations and preparing to re-wrap limb. Pt agreeable to PT treatment and denied any pain during session but remains tangential. Session with emphasis on limb care and generalized strengthening. Informed RN of technique for wrapping residual limb per PA, Pam. Therapist assisted RN with wrapping residual limb with non-adherent dressing, silvadene cream on Kerlix, followed by ace wrap and shrinker for edema management. Pt then performed the following exercises on R residual limb with supervision and verbal cues for technique: -SLR 2x15 -hip abduction 2x15 Concluded session with pt semi-reclined in bed, needs within reach, and bed alarm on.   Therapy Documentation Precautions:  Precautions Precautions: Fall Precaution Comments: new R BKA NWB, off COVID precautions Required Braces or Orthoses: Other Brace (Limb guard) Other Brace: Residual limb protector. Restrictions Weight Bearing Restrictions: Yes RLE Weight Bearing: Non weight bearing  Therapy/Group: Individual Therapy Martin Majestic PT, DPT   02/09/2021, 7:18 AM

## 2021-02-09 NOTE — Progress Notes (Signed)
Occupational Therapy Session Note  Patient Details  Name: Mitchell [name] MRN: [id number] Date of Birth: 05/07/1971  Today's Date: 02/09/2021 OT Individual Time: 2353-6144 OT Individual Time Calculation (min): 12 min  and Today's Date: 02/09/2021 OT Missed Time: 48 Minutes Missed Time Reason: Patient fatigue;Patient unwilling/refused to participate without medical reason (lunch)   Short Term Goals: Week 1:  OT Short Term Goal 1 (Week 1): Pt will donn 1 LB dressing using adpative equipment with Min A OT Short Term Goal 2 (Week 1): Pt will complete sit > stand transfers during self-care tasks with CGA OT Short Term Goal 3 (Week 1): Pt will complete stand pivot toilet transfer with Min A +1 OT Short Term Goal 4 (Week 1): Pt will complete 2 grooming tasks while standing at the sink with CGA  Skilled Therapeutic Interventions/Progress Updates:    Treatment session limited secondary to fatigue and pt requesting to eat lunch.  Pt received attempting to sit up to EOB with nursing staff and pt c/o staff not coming when he calls for toileting needs.  Therapist provided distant supervision while pt used urinal while seated EOB.  Pt completed hygiene with setup.  Pt began eating lunch as pt reports stomach pain and not having eaten much today.  Pt requested to allow time for lunch.  Therapist returned later to pt still eating and voicing desire to return to bed afterwards.  RN aware of pt current position and status.  Pt missed 48 mins due to lunch and refusal.  Therapy Documentation Precautions:  Precautions Precautions: Fall Precaution Comments: new R BKA NWB, off COVID precautions Required Braces or Orthoses: Other Brace (Limb guard) Other Brace: Residual limb protector. Restrictions Weight Bearing Restrictions: Yes RLE Weight Bearing: Non weight bearing General: General OT Amount of Missed Time: 48 Minutes Vital Signs: Therapy Vitals Temp: 97.6 F (36.4 C) Pulse Rate: 73 Resp: 18 BP:  136/81 Patient Position (if appropriate): Lying Oxygen Therapy SpO2: 94 % O2 Device: Room Air Pain: Pain Assessment Pain Scale: 0-10 Pain Score: 0-No pain   Therapy/Group: Individual Therapy  Rosalio Loud 02/09/2021, 2:53 PM
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
## 2021-02-09 NOTE — Plan of Care (Signed)
  Problem: Consults Goal: RH LIMB LOSS PATIENT EDUCATION Description: Description: See Patient Education module for eduction specifics. Outcome: Progressing   Problem: RH BOWEL ELIMINATION Goal: RH STG MANAGE BOWEL WITH ASSISTANCE Description: STG Manage Bowel with  mod I Assistance. Outcome: Progressing Goal: RH STG MANAGE BOWEL W/MEDICATION W/ASSISTANCE Description: STG Manage Bowel with Medication with mod I Assistance. Outcome: Progressing   Problem: RH BLADDER ELIMINATION Goal: RH STG MANAGE BLADDER WITH ASSISTANCE Description: STG Manage Bladder With  mod I Assistance Outcome: Progressing   Problem: RH SKIN INTEGRITY Goal: RH STG SKIN FREE OF INFECTION/BREAKDOWN Description: Maintain skin with min assist Outcome: Progressing Goal: RH STG MAINTAIN SKIN INTEGRITY WITH ASSISTANCE Description: STG Maintain Skin Integrity With min Assistance. Outcome: Progressing Goal: RH STG ABLE TO PERFORM INCISION/WOUND CARE W/ASSISTANCE Description: STG Able To Perform Incision/Wound Care With min  Assistance. Outcome: Progressing   Problem: RH PAIN MANAGEMENT Goal: RH STG PAIN MANAGED AT OR BELOW PT'S PAIN GOAL Description: At or below level 4 Outcome: Progressing   Problem: RH KNOWLEDGE DEFICIT LIMB LOSS Goal: RH STG INCREASE KNOWLEDGE OF SELF CARE AFTER LIMB LOSS Description: Patient will be able to manage care or direct others to assist with care using handouts and educational tools independently Outcome: Progressing   Problem: Education: Goal: Ability to demonstrate management of disease process will improve Description: Manage HF with medications and dietary modifications independently Outcome: Progressing Goal: Individualized Educational Video(s) Outcome: Progressing   Problem: Activity: Goal: Capacity to carry out activities will improve Description: Patient will be able to manage ADLs with min assist Outcome: Progressing   Problem: Cardiac: Goal: Ability to achieve  and maintain adequate cardiopulmonary perfusion will improve Description: Perfusion will improve as noted by BNP WNL, weight gain < 3 #/day and taking medications, dietary modifications followed with min assist Outcome: Progressing

## 2021-02-10 DIAGNOSIS — Z89511 Acquired absence of right leg below knee: Secondary | ICD-10-CM | POA: Diagnosis not present

## 2021-02-10 LAB — GLUCOSE, CAPILLARY
Glucose-Capillary: 112 mg/dL — ABNORMAL HIGH (ref 70–99)
Glucose-Capillary: 128 mg/dL — ABNORMAL HIGH (ref 70–99)
Glucose-Capillary: 140 mg/dL — ABNORMAL HIGH (ref 70–99)
Glucose-Capillary: 193 mg/dL — ABNORMAL HIGH (ref 70–99)

## 2021-02-10 MED ORDER — POLYETHYLENE GLYCOL 3350 17 G PO PACK
17.0000 g | PACK | Freq: Every day | ORAL | Status: DC
  Administered 2021-02-10 – 2021-02-16 (×6): 17 g via ORAL
  Filled 2021-02-10 (×7): qty 1

## 2021-02-10 MED ORDER — ACETAMINOPHEN 325 MG PO TABS
650.0000 mg | ORAL_TABLET | Freq: Three times a day (TID) | ORAL | Status: DC
  Administered 2021-02-10 – 2021-02-17 (×20): 650 mg via ORAL
  Filled 2021-02-10 (×21): qty 2

## 2021-02-10 NOTE — Progress Notes (Signed)
Physical Therapy Session Note  Patient Details  Name: Mitchell [name] MRN: [id number] Date of Birth: 1970/09/29  Today's Date: 02/10/2021 PT Individual Time: 1302-1328 PT Individual Time Calculation (min): 26 min   Short Term Goals: Week 1:  PT Short Term Goal 1 (Week 1): STG = LTG due to LOS  Skilled Therapeutic Interventions/Progress Updates:     Pt greeted seated in w/c to start session, agreeable to PT tx but reports generalized fatigue and abdominal discomfort - treatment to tolerance. Denies any R BKA pain. Reports he received new shrinker's but requests to wait to put them on until after his shower with OT session later in the day. Pt propelled himself with supervision in w/c from his room to ortho gym, ~120f, using BUE's with appropriate stroke efficiency - needed cues for taking wide turns into doorways. Pt deferring transfer training due to fatigue and requests to work on UE strengthening. Completed the following there-ex while seated in w/c: -1x15 shldr press with 1kg med ball -1x15 chest press with 1kg medb all -2x15 chop/lift patterns with 1kg med ball *pt required rest breaks b/w sets  Pt transported back to his room in his w/c for energy conservation and remained seated in w/c with chair alarm on and all needs met.  Therapy Documentation Precautions:  Precautions Precautions: Fall Precaution Comments: new R BKA NWB, off COVID precautions Required Braces or Orthoses: Other Brace (Limb guard) Other Brace: Residual limb protector. Restrictions Weight Bearing Restrictions: Yes RLE Weight Bearing: Non weight bearing General:    Therapy/Group: Individual Therapy  CAlger Simons7/14/2022, 7:40 AM

## 2021-02-10 NOTE — Progress Notes (Signed)
Physical Therapy Session Note  Patient Details  Name: Mitchell [name] MRN: [id number] Date of Birth: Mar 25, 1971  Today's Date: 02/10/2021 PT Individual Time: 0932-6712 and 1130-1156 PT Individual Time Calculation (min): 25 min and 26 min  Short Term Goals: Week 1:  PT Short Term Goal 1 (Week 1): STG = LTG due to LOS  Skilled Therapeutic Interventions/Progress Updates:   Treatment Session 1 Received pt sidelying in bed asleep, pt required mod verbal cues to arouse and very lethargic requiring increased time to open eyes. Pt also appeared slightly confused unsure who came in this morning to wrap his limb and unable to recall when and what time he received mediations. Pt ultimately agreeable to PT treatment and denied any pain during session. Session with emphasis on functional mobility, generalized strengthening, and improved activity tolerance. Pt transferred supine<>siting EOB with supervision and performed the following exercises sitting EOB with supervision and verbal cues for technique: -hip flexion 2x15 bilaterally -LAQ 2x15 bilaterally  -hip adduction pillow squeezes 2x15 with 5 second isometric hold -hamstring curls on LLE with orange TB 2x15 -hip abduction with orange TB 2x15 Pt declined transferring to [facility] or recliner and requested to lie back down to sleep. Transferred sit<>supine with supervision. Concluded session with pt sidelying in bed, needs within reach, and bed alarm on.   Treatment Session 2 Received pt supine in bed asleep, upon wakening pt reported feeling sleepy and requested therapist ask RN if any medications could have made him lethargic. Consulted with RN and charge RN who stated that none of the medications have side effects of lethargy. Therapist suggested getting OOB for improved upright tolerance and pt agreed.  Session with emphasis on functional mobility/transfers, generalized strengthening, and improved activity tolerance. NT present to check blood glucose and pt  transferred supine<>sitting EOB with supervision and able to void in urinal with set up assist. Donned L shoe with max A and pt transferred bed<>WC stand<>pivot with RW and min A. Concluded session with pt sitting in WC, needs within reach, and chair pad alarm on. Opened blinds and turned on Television for environmental stimuli to waken pt.    Therapy Documentation Precautions:  Precautions Precautions: Fall Precaution Comments: new R BKA NWB, off COVID precautions Required Braces or Orthoses: Other Brace (Limb guard) Other Brace: Residual limb protector. Restrictions Weight Bearing Restrictions: Yes RLE Weight Bearing: Non weight bearing  Therapy/Group: Individual Therapy Martin Majestic PT, DPT ' 02/10/2021, 7:28 AM

## 2021-02-10 NOTE — IPOC Note (Signed)
Overall Plan of Care [facility]) Patient Details Name: Mitchell [name] MRN: [id number] DOB: [birth year]  Admitting Diagnosis: S/P below knee amputation, right [facility])  Hospital Problems: Principal Problem:   S/P below knee amputation, right [facility])     Functional Problem List: Nursing Bladder, Bowel, Medication Management, Pain, Safety, Skin Integrity  PT Balance, Behavior, Edema, Endurance, Motor, Pain, Safety, Skin Integrity  OT Balance, Behavior, Endurance, Motor, Safety, Skin Integrity  SLP    TR         Basic ADL's: OT Grooming, Bathing, Dressing, Toileting     Advanced  ADL's: OT Simple Meal Preparation, Light Housekeeping     Transfers: PT Bed Mobility, Bed to Chair, Car, Occupational psychologist, Research scientist (life sciences): PT Ambulation, Psychologist, prison and probation services, Stairs     Additional Impairments: OT    SLP        TR      Anticipated Outcomes Item Anticipated Outcome  Self Feeding Set-up  Dispensing optician Transfers Supervision  Bowel/Bladder  Manage bowel and bladder with min assist  Transfers  Sup w/LRAD  Locomotion  Sup w/LRAD  Communication     Cognition     Pain  at or below level 4  Safety/Judgment  maintain safety with cues/reminders   Therapy Plan: PT Intensity: Minimum of 1-2 x/day ,45 to 90 minutes PT Frequency: 5 out of 7 days PT Duration Estimated Length of Stay: 7-10 days OT Intensity: Minimum of 1-2 x/day, 45 to 90 minutes OT Frequency: 5 out of 7 days OT Duration/Estimated Length of Stay: 7-10 days     Due to the current state of emergency, patients may not be receiving their 3-hours of Medicare-mandated therapy.   Team Interventions: Nursing Interventions Disease Management/Prevention, Medication Management, Discharge Planning, Pain Management, Bowel Management, Patient/Family Education, Bladder Management, Skin Care/Wound Management  PT interventions Ambulation/gait  training, Community reintegration, DME/adaptive equipment instruction, Neuromuscular re-education, Stair training, UE/LE Strength taining/ROM, Wheelchair propulsion/positioning, Warden/ranger, Discharge planning, Pain management, Skin care/wound management, Therapeutic Activities, UE/LE Coordination activities, Cognitive remediation/compensation, Disease management/prevention, Functional mobility training, Patient/family education, Therapeutic Exercise  OT Interventions Warden/ranger, Community reintegration, Discharge planning, Pain management, Functional mobility training, DME/adaptive equipment instruction, Patient/family education, Therapeutic Activities, Therapeutic Exercise, Psychosocial support, Self Care/advanced ADL retraining, UE/LE Coordination activities, Visual/perceptual remediation/compensation, Neuromuscular re-education  SLP Interventions    TR Interventions    SW/CM Interventions Discharge Planning, Psychosocial Support, Patient/Family Education, Disease Management/Prevention   Barriers to Discharge MD  Medical stability and Wound care  Nursing Decreased caregiver support, Home environment access/layout, Wound Care 1 level 2ste bil rails  PT Wound Care, Lack of/limited family support, Weight bearing restrictions, Behavior, Home environment access/layout Pt's wife is handicapped, will have HH. Parts of home already ADA compliant. Pt difficult to educate/cue as he is very particular about how he will perform movements. Requires encouragment.  OT Decreased caregiver support, Wound Care, Behavior, Weight bearing restrictions    SLP      SW Wound Care, Insurance for SNF coverage     Team Discharge Planning: Destination: PT-Home ,OT- Home , SLP-  Projected Follow-up: PT-Home health PT, OT-  Home health OT, SLP-  Projected Equipment Needs: PT-To be determined, OT- To be determined, SLP-  Equipment Details: PT- , OT-TBD Patient/family involved in  discharge planning: PT- Patient,  OT-Patient, SLP-   MD ELOS: 18-21 days Medical Rehab Prognosis:  Good Assessment: Mitchell [name] is a  50 year old man admitted to CIR with functional and mobility deficits secondary to right BKA. Active medical issues include blisters at medial incision site requiring ortho eval, opioid-induced constipation, and abdominal pain and nausea. Medications are being managed, and labs and vitals are being monitored regularly.      See Team Conference Notes for weekly updates to the plan of care

## 2021-02-10 NOTE — Progress Notes (Signed)
Occupational Therapy Session Note  Patient Details  Name: Mitchell [name] MRN: [id number] Date of Birth: 13-Oct-1970  Today's Date: 02/10/2021 OT Individual Time: 8144-8185 OT Individual Time Calculation (min): 59 min    Short Term Goals: Week 1:  OT Short Term Goal 1 (Week 1): Pt will donn 1 LB dressing using adpative equipment with Min A OT Short Term Goal 2 (Week 1): Pt will complete sit > stand transfers during self-care tasks with CGA OT Short Term Goal 3 (Week 1): Pt will complete stand pivot toilet transfer with Min A +1 OT Short Term Goal 4 (Week 1): Pt will complete 2 grooming tasks while standing at the sink with CGA  Skilled Therapeutic Interventions/Progress Updates:    Pt in wheelchair to start requesting to complete toilet transfer to the elevated toilet without the 3:1 as it is too small.  Noted pt with wide 3:1 in the room and persuaded pt to try this over the toilet to see how it would work.  He was then able to stand from the wheelchair with min assist and hop to the bathroom approximately 6' with the RW at min assist level.  Min assist for managing clothing with supervision for toilet hygiene in sitting.  He was able to donn his pants over his feet after cleaning and stand to pull them up over his hips at min assist as well.  Completed short distance functional mobility back out to the wheelchair at the sink with min assist.  Then he completed washing of his UB and donning a new shirt with setup for both tasks.  He did not attempt removing the left sock or shoe to complete any bathing of the left lower limb.  Finished session with completion of light therex exercises using the light/medium resistance orange therapy band.  He was able to complete 1 set of 15 reps for bilateral shoulder flexion as well as horizontal shoulder abduction.  Call button and phone in reach with safety alarm in place.    Therapy Documentation Precautions:  Precautions Precautions: Fall Precaution  Comments: new R BKA NWB, off COVID precautions Required Braces or Orthoses: Other Brace Knee Immobilizer - Right: On at all times Other Brace: Residual limb protector. Restrictions Weight Bearing Restrictions: Yes RLE Weight Bearing: Non weight bearing  Pain: Pain Assessment Pain Scale: Faces Pain Score: 0-No pain ADL: See Care Tool Section for some details of mobility and selfcare   Therapy/Group: Individual Therapy  Tylee Yum OTR/L 02/10/2021, 3:52 PM

## 2021-02-10 NOTE — Progress Notes (Signed)
Orthopedic Tech Progress Note Patient Details:  EVERSON MOTT 12/28/1970 [id number] Called order into [employer] Patient ID: MCKINNLEY SMITHEY, male   DOB: Feb 06, 1971, 50 y.o.   MRN: [id number]  Lovett Calender 02/10/2021, 9:36 AM

## 2021-02-10 NOTE — Progress Notes (Signed)
Occupational Therapy Session Note  Patient Details  Name: Mitchell [name] MRN: [id number] Date of Birth: 12/29/1970  Today's Date: 02/10/2021 OT Individual Time: 1500-1600 OT Individual Time Calculation (min): 60 min    Short Term Goals: Week 1:  OT Short Term Goal 1 (Week 1): Pt will donn 1 LB dressing using adpative equipment with Min A OT Short Term Goal 2 (Week 1): Pt will complete sit > stand transfers during self-care tasks with CGA OT Short Term Goal 3 (Week 1): Pt will complete stand pivot toilet transfer with Min A +1 OT Short Term Goal 4 (Week 1): Pt will complete 2 grooming tasks while standing at the sink with CGA  Skilled Therapeutic Interventions/Progress Updates:     Pt received in w/c with 0 out of 10 pain in residual limb.   Therapeutic activity Focus of session on limb loss education. Handouts/review of content provided in large font d/t visual deficits on the following topics: skin inspection (mirror provided), skin desensitization and phantom sensation. Pt able to recall shrinker needed to be changed. Drainage noted from medial aspect of residual limb. Pt tolerates donning new shrinker after OT cleans skin with saline (sterile) wipe. Pt able to use mirror to look at skin before new shrinker donned. Returned to bed at end of session via MIN A squat pivot.  Pt left at end of session in bed with exit alarm on, call light in reach and all needs met   Therapy Documentation Precautions:  Precautions Precautions: Fall Precaution Comments: new R BKA NWB, off COVID precautions Required Braces or Orthoses: Other Brace (Limb guard) Other Brace: Residual limb protector. Restrictions Weight Bearing Restrictions: Yes RLE Weight Bearing: Non weight bearing General:   Vital Signs: Therapy Vitals Temp: 97.9 F (36.6 C) Pulse Rate: 71 Resp: 18 BP: 120/68 Patient Position (if appropriate): Lying Oxygen Therapy SpO2: 99 % O2 Device: Room Air Pain:    ADL: ADL Grooming: Setup Where Assessed-Grooming: Edge of bed Upper Body Bathing: Contact guard Where Assessed-Upper Body Bathing: Edge of bed Lower Body Bathing: Contact guard Upper Body Dressing: Contact guard Where Assessed-Upper Body Dressing: Edge of bed Lower Body Dressing: Moderate assistance Where Assessed-Lower Body Dressing: Other (Comment) (Standing by bed) Toileting: Moderate assistance Where Assessed-Toileting: Bedside Commode Toilet Transfer: Moderate assistance (+2) Toilet Transfer Method: Stand pivot Toilet Transfer Equipment: Bedside commode Tub/Shower Transfer: Unable to assess Walk-In Shower Transfer: Unable to assess Vision   Perception    Praxis   Exercises:   Other Treatments:     Therapy/Group: Individual Therapy   M  02/10/2021, 6:59 AM 

## 2021-02-10 NOTE — Progress Notes (Signed)
Patient is status post below-knee amputation asked to reevaluate for some blistering.  Shrinker was on top of a very bulky large dressing this was removed.  Overall well apposed wound edges.  No dehiscence  he does have some blisters but is resolving.  At this point would recommend changing out shrinkers daily I have ordered 2 more shrinkers and a smaller size.  Emphasized the importance of not placing any dressing beneath the shrinker as the shrinker is medicated
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
## 2021-02-11 DIAGNOSIS — Z89511 Acquired absence of right leg below knee: Secondary | ICD-10-CM | POA: Diagnosis not present

## 2021-02-11 LAB — MAGNESIUM: Magnesium: 1.9 mg/dL (ref 1.7–2.4)

## 2021-02-11 LAB — GLUCOSE, CAPILLARY
Glucose-Capillary: 109 mg/dL — ABNORMAL HIGH (ref 70–99)
Glucose-Capillary: 102 mg/dL — ABNORMAL HIGH (ref 70–99)
Glucose-Capillary: 121 mg/dL — ABNORMAL HIGH (ref 70–99)
Glucose-Capillary: 147 mg/dL — ABNORMAL HIGH (ref 70–99)

## 2021-02-11 NOTE — Progress Notes (Signed)
Occupational Therapy Session Note  Patient Details  Name: Mitchell [name] MRN: [id number] Date of Birth: Oct 15, 1970  Today's Date: 02/11/2021 OT Individual Time: 1300-1400 OT Individual Time Calculation (min): 60 min    Short Term Goals: Week 1:  OT Short Term Goal 1 (Week 1): Pt will donn 1 LB dressing using adpative equipment with Min A OT Short Term Goal 2 (Week 1): Pt will complete sit > stand transfers during self-care tasks with CGA OT Short Term Goal 3 (Week 1): Pt will complete stand pivot toilet transfer with Min A +1 OT Short Term Goal 4 (Week 1): Pt will complete 2 grooming tasks while standing at the sink with CGA  Skilled Therapeutic Interventions/Progress Updates:    Treatment session with focus on BUE strengthening, stand pivot transfers, and dynamic standing balance.  Pt received in bed with RN present changing shrinker sock and compression wrapping. Pt utilizing inspection mirror during dressing change for carryover of skin inspection.  Pt agreeable to BUE strengthening exercises with theraband.  Therapist provided pt with BUE HEP with enlarged font due to visual deficits.  Pt completed shoulder horizontal abduction, diagonal reach, and elbow flexion and extension in seated position with orange theraband 1 set of 15 each side.  Pt reports need to toilet.  Completed squat pivot bed > w/c with min assist followed by stand pivot w/c > BSC over toilet with RW with min assist.  Pt completed hygiene with lateral leans and sit > stand to pull pants over hips with min assist for standing balance.  Pt returned to w/c stand pivot min assist and remained upright in w/c with chair alarm on and all needs in reach.  Therapy Documentation Precautions:  Precautions Precautions: Fall Precaution Comments: new R BKA NWB, off COVID precautions Required Braces or Orthoses: Other Brace Knee Immobilizer - Right: On at all times Other Brace: Residual limb protector. Restrictions Weight Bearing  Restrictions: Yes RLE Weight Bearing: Non weight bearing  Vital Signs: Therapy Vitals Temp: 98.1 F (36.7 C) Pulse Rate: 75 Resp: 18 BP: 120/65 Patient Position (if appropriate): Sitting Oxygen Therapy SpO2: 99 % O2 Device: Room Air Pain:  Pt with no c/o pain   Therapy/Group: Individual Therapy  Rosalio Loud 02/11/2021, 3:13 PM
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
## 2021-02-11 NOTE — Progress Notes (Signed)
Physical Therapy Session Note  Patient Details  Name: Mitchell [name] MRN: [id number] Date of Birth: August 25, 1970  Today's Date: 02/11/2021 PT Individual Time: 8250-5397 PT Individual Time Calculation (min): 27 min   Short Term Goals: Week 1:  PT Short Term Goal 1 (Week 1): STG = LTG due to LOS  Skilled Therapeutic Interventions/Progress Updates:  Pt received sitting in WC in room. Reported abdominal discomfort but no pain. Pt agreeable to PT but would not leave room until nursing checked his residual limb. Communicated with nursing and provided positional changes and offered cold pack for relief. Pt spoke to nursing briefly and agreed to recheck R leg at end of session.   Pt self-propelled in WC from room to main gym w/supervision. Pt required encouragement to attempt standing and inquired about his LOS. Educated pt on reasoning behind DC date, as pt believed he would be in CIR for months. Pt seemingly excited over DC date and provided motivation for him to participate. In // bars, performed multiple sit<>stands w/min A for trunk support for improved standing tolerance. Static standing w/BUE support for ~2 minutes and CGA for safety for improved balance. Pt performed 2 sets of 15 standing abductions of RLE w/BUE support on rails for improved glute strength and dynamic standing balance. Noted pt flexed RLE during first set, provided correctional cues to isolate glute muscles during movement which improved on second set. Pt propelled in WC back to room w/supervision. Stand pivot transfer from [facility] to bed w/RW and min A for steadying assist. Pt was left R sidelying in bed with all needs in reach.   Pt presents with impaired gait, balance and endurance 2/2 R BKA. Pt participation improves when therapist provides explanations to interventions prior to doing them. Pt was left R sidelying in bed with all needs in reach.  Therapy Documentation Precautions:  Precautions Precautions: Fall Precaution  Comments: new R BKA NWB, off COVID precautions Required Braces or Orthoses: Other Brace Knee Immobilizer - Right: On at all times Other Brace: Residual limb protector. Restrictions Weight Bearing Restrictions: Yes RLE Weight Bearing: Non weight bearing  Vital Signs: Therapy Vitals Temp: 98.2 F (36.8 C) Temp Source: Oral Pulse Rate: 73 Resp: 17 BP: 134/82 Patient Position (if appropriate): Lying Oxygen Therapy SpO2: 97 % O2 Device: Room Air   Therapy/Group: Individual Therapy  Yailin Biederman E Kendrix Orman Jill Alexanders Jonice Cerra, PT, DPT  02/11/2021, 7:39 AM

## 2021-02-11 NOTE — Progress Notes (Signed)
Occupational Therapy Session Note  Patient Details  Name: Mitchell [name] MRN: [id number] Date of Birth: 02/17/1971  Today's Date: 02/11/2021 OT Individual Time: 0730-0830 OT Individual Time Calculation (min): 60 min    Short Term Goals: Week 1:  OT Short Term Goal 1 (Week 1): Pt will donn 1 LB dressing using adpative equipment with Min A OT Short Term Goal 2 (Week 1): Pt will complete sit > stand transfers during self-care tasks with CGA OT Short Term Goal 3 (Week 1): Pt will complete stand pivot toilet transfer with Min A +1 OT Short Term Goal 4 (Week 1): Pt will complete 2 grooming tasks while standing at the sink with CGA   Skilled Therapeutic Interventions/Progress Updates:    Pt received supine with no c/o pain. Pt very verbose and often requiring gentle redirection to task at hand. Pt transferred to EOB with supervision and ate breakfast. He had both shrinker and ace bandage on. Discussed that usual course of action is to not have both shrinker and ace bandage on- however Dr Lovorn confirmed pt is to have BOTH shrinker and ace wrap on, shrinker directly against skin. Re-wrapped ace wrap and provided edu on figure 8 technique. Pt completed squat pivot transfer with min A toward the L. Pt completed w/c propulsion 100 ft to the therapy gym with supervision, slow pace and several rest breaks. Pt completed BUE strengthening and endurance on the ergometer at level 6.0- 5 min without stopping. While he completed this- OT reinforced residual limb leg rest with a towel to provide increased support to put knee into extension. Pt returned to his room and was left sitting up, all needs met. Chair alarm set.    Therapy Documentation Precautions:  Precautions Precautions: Fall Precaution Comments: new R BKA NWB, off COVID precautions Required Braces or Orthoses: Other Brace Knee Immobilizer - Right: On at all times Other Brace: Residual limb protector. Restrictions Weight Bearing Restrictions:  Yes RLE Weight Bearing: Non weight bearing   Therapy/Group: Individual Therapy   H  02/11/2021, 6:10 AM 

## 2021-02-11 NOTE — Progress Notes (Signed)
Occupational Therapy Note  Patient Details  Name: AQIB LOUGH MRN: [id number] Date of Birth: 26-Apr-1971  Today's Date: 02/11/2021 OT Missed Time: 30 Minutes Missed Time Reason: Pain;Patient unwilling/refused to participate without medical reason;Patient fatigue  Pt missed 30 mins scheduled OT treatment session secondary to nausea and fatigue.  Pt reports taking medication on empty stomach and now feeling nauseous and not wanting to exert himself and then vomit.  Therapist educated pt on various bed level activities with pt refusing.  Pt reports agreeable to therapy in PM after lunch.     Rosalio Loud 02/11/2021, 12:08 PM

## 2021-02-11 NOTE — Progress Notes (Signed)
Physical Therapy Session Note  Patient Details  Name: Mitchell [name] MRN: [id number] Date of Birth: 12/11/1970  Today's Date: 02/11/2021 PT Individual Time: 1478-2956 PT Individual Time Calculation (min): 43 min   Short Term Goals: Week 1:  PT Short Term Goal 1 (Week 1): STG = LTG due to LOS   Skilled Therapeutic Interventions/Progress Updates:  Patient seated upright in w/c on entrance to room. Patient alert and agreeable to PT session but initially slightly emotional/ introspective re: recent operation, progress, and expectations. Patient with no pain complaint during session.  Therapeutic Activity: Bed Mobility: Patient performed sit--> R sidelying at end of session with supervision. VC/ tc required for technique to bring BLE up to bed surface. Transfers: Initial STS from w/c to RW performed with launch up to walker and uncontrolled descent to sit. Focus with pt on 3 points of contact for safe mobility and transfers. Blocked practice for improved control throughout STS. Patient guided in STS and transfers with vc throughout for starting hand placement with split hand positioning and explanation of weight shift from push of LUE from w/c armrest over to RUE on RW prior to repositioning LUE to RW. Then cued for reach back to Bil armrests in order to use BUE to control descent to chair for increased safety. Pt able to perform with CGA by end of practice and with good control throughout.   Pt able to transfer w/c to bed with squat pivot transfer and light CGA. No need for vc.   Wheelchair Mobility:  Patient propelled wheelchair around bed in room with intention to get in recliner, but then changes mind and decided to return to bed for nap prior to dinner time. Wheeled back around bed for more room to safely transfer to bed. Propelled and maneuvered in tight spaces requiring cueing to correct.   Patient sidelying to R side in bed at end of session with brakes locked, bed alarm set, and all needs  within reach.     Therapy Documentation Precautions:  Precautions Precautions: Fall Precaution Comments: new R BKA NWB, off COVID precautions Required Braces or Orthoses: Other Brace Knee Immobilizer - Right: On at all times Other Brace: Residual limb protector. Restrictions Weight Bearing Restrictions: Yes RLE Weight Bearing: Non weight bearing  Therapy/Group: Individual Therapy  Loel Dubonnet PT, DPT  02/11/2021, 6:36 PM

## 2021-02-12 DIAGNOSIS — Z89511 Acquired absence of right leg below knee: Secondary | ICD-10-CM | POA: Diagnosis not present

## 2021-02-12 LAB — GLUCOSE, CAPILLARY
Glucose-Capillary: 98 mg/dL (ref 70–99)
Glucose-Capillary: 106 mg/dL — ABNORMAL HIGH (ref 70–99)
Glucose-Capillary: 142 mg/dL — ABNORMAL HIGH (ref 70–99)
Glucose-Capillary: 240 mg/dL — ABNORMAL HIGH (ref 70–99)
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
## 2021-02-12 NOTE — Progress Notes (Signed)
Occupational Therapy Session Note  Patient Details  Name: Mitchell [name] MRN: [id number] Date of Birth: 01-25-1971  Today's Date: 02/12/2021 OT Individual Time: 8295-6213 and 1300-1340 OT Individual Time Calculation (min): 46 min and 40 min 14 minutes missed  Short Term Goals: Week 1:  OT Short Term Goal 1 (Week 1): Pt will donn 1 LB dressing using adpative equipment with Min A OT Short Term Goal 2 (Week 1): Pt will complete sit > stand transfers during self-care tasks with CGA OT Short Term Goal 3 (Week 1): Pt will complete stand pivot toilet transfer with Min A +1 OT Short Term Goal 4 (Week 1): Pt will complete 2 grooming tasks while standing at the sink with CGA  Skilled Therapeutic Interventions/Progress Updates:    Pt greeted in bed, reporting not feeling well as he had just taken pain medicine on an empty stomach and was experiencing stomach discomfort. Pt at first not expressing much concern over this. He declined participating in bathing, dressing, or toileting tasks. Suggested transferring to the w/c or recliner with pt declining. At this time he requested to sit up EOB because he said he was having some difficultly breathing, no overt s/s SOB observed. Setup for supine<sit. Pt then asked to be set up for oral care so this was done by OT. Pt suddenly became increasingly teary, relaying to OT that he didn't feel listened to by medical staff here. Stated that whenever he took his medicine, discomfort in his stomach made breathing more difficult. Pt reported that this only happens when he takes medicine on an empty stomach. OT suggested that pt collaborate with nursing staff, have snacks at the bedside to take with medicine and maybe work on scheduling different medicines at different times. Asked if he discussed stomach discomfort/breathing issues with MD this morning during rounds. Pt reported the doctor did come to see him but he was too "sleepy" to relay his concerns. OT utilized  therapeutic use of self and therapeutic listening strategies but ultimately unable to comfort pt or find a way to collaborate with him in order to find a solution to his concerns. He called family up, more distressed at this time and still crying. Notified RN and also MD. Pt was left with MD at close of session, family on the other end of the phone. Time missed due to pt/family speaking with doctor.   2nd Session 1:1 tx (40 min) Pt greeted in bed and premedicated for pain, feeling much better after conversing with MD today. He was agreeable to tx. Started with UB strengthening exercises while sitting EOB, pt using the 4# bar x15 reps given instruction from OT. He then reported that he needed to urgently use the restroom. Pt donned shorts + Lt shoe with assistance to tie shoelaces and CGA for standing balance when pulling pants over hips using RW for support as needed. Ambulatory transfer to the elevated toilet completed using device with CGA. He was able to lower his pants on his own, +bladder void. Pt unable to void bowels though he tried. CGA-Min A for elevating pants over hips and then he returned to EOB, doffed pants using lateral leans. Assistance to doff Lt shoe. Pt returned to bed and required assistance for repositioning for comfort, slightly increased time for this. Left him with all needs within reach and bed alarm set.   Therapy Documentation Precautions:  Precautions Precautions: Fall Precaution Comments: new R BKA NWB, off COVID precautions Required Braces or Orthoses: Other Brace Knee Immobilizer -  Right: On at all times Other Brace: Residual limb protector. Restrictions Weight Bearing Restrictions: Yes RLE Weight Bearing: Non weight bearing  Pain: in stomach, addressed as stated above   ADL: ADL Grooming: Setup Where Assessed-Grooming: Edge of bed Upper Body Bathing: Contact guard Where Assessed-Upper Body Bathing: Edge of bed Lower Body Bathing: Contact guard Upper Body  Dressing: Contact guard Where Assessed-Upper Body Dressing: Edge of bed Lower Body Dressing: Moderate assistance Where Assessed-Lower Body Dressing: Other (Comment) (Standing by bed) Toileting: Moderate assistance Where Assessed-Toileting: Bedside Commode Toilet Transfer: Moderate assistance (+2) Toilet Transfer Method: Stand pivot Toilet Transfer Equipment: Bedside commode Tub/Shower Transfer: Unable to assess [employer] Transfer: Unable to assess      Therapy/Group: Individual Therapy  Yaelis Scharfenberg A Abdalla Naramore 02/12/2021, 4:20 PM
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
## 2021-02-13 DIAGNOSIS — Z89511 Acquired absence of right leg below knee: Secondary | ICD-10-CM | POA: Diagnosis not present

## 2021-02-13 LAB — GLUCOSE, CAPILLARY
Glucose-Capillary: 116 mg/dL — ABNORMAL HIGH (ref 70–99)
Glucose-Capillary: 115 mg/dL — ABNORMAL HIGH (ref 70–99)
Glucose-Capillary: 120 mg/dL — ABNORMAL HIGH (ref 70–99)
Glucose-Capillary: 160 mg/dL — ABNORMAL HIGH (ref 70–99)
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
## 2021-02-14 DIAGNOSIS — I739 Peripheral vascular disease, unspecified: Secondary | ICD-10-CM | POA: Diagnosis not present

## 2021-02-14 DIAGNOSIS — G8918 Other acute postprocedural pain: Secondary | ICD-10-CM | POA: Diagnosis not present

## 2021-02-14 DIAGNOSIS — E1169 Type 2 diabetes mellitus with other specified complication: Secondary | ICD-10-CM | POA: Diagnosis not present

## 2021-02-14 DIAGNOSIS — Z89511 Acquired absence of right leg below knee: Secondary | ICD-10-CM | POA: Diagnosis not present

## 2021-02-14 LAB — MAGNESIUM: Magnesium: 1.9 mg/dL (ref 1.7–2.4)

## 2021-02-14 LAB — GLUCOSE, CAPILLARY
Glucose-Capillary: 92 mg/dL (ref 70–99)
Glucose-Capillary: 118 mg/dL — ABNORMAL HIGH (ref 70–99)
Glucose-Capillary: 126 mg/dL — ABNORMAL HIGH (ref 70–99)
Glucose-Capillary: 126 mg/dL — ABNORMAL HIGH (ref 70–99)

## 2021-02-14 LAB — BASIC METABOLIC PANEL
Anion gap: 8 (ref 5–15)
BUN: 36 mg/dL — ABNORMAL HIGH (ref 6–20)
CO2: 28 mmol/L (ref 22–32)
Calcium: 9.1 mg/dL (ref 8.9–10.3)
Chloride: 102 mmol/L (ref 98–111)
Creatinine, Ser: 1.82 mg/dL — ABNORMAL HIGH (ref 0.61–1.24)
GFR, Estimated: 44 mL/min — ABNORMAL LOW (ref >60)
Glucose, Bld: 138 mg/dL — ABNORMAL HIGH (ref 70–99)
Potassium: 4.2 mmol/L (ref 3.5–5.1)
Sodium: 138 mmol/L (ref 135–145)

## 2021-02-14 NOTE — Progress Notes (Signed)
Physical Therapy Session Note  Patient Details  Name: ANUJ SUMMONS MRN: [id number] Date of Birth: 01-May-1971  Today's Date: 02/14/2021 PT Individual Time: 0900-0955 PT Individual Time Calculation (min): 55 min   Short Term Goals: Week 1:  PT Short Term Goal 1 (Week 1): STG = LTG due to LOS  Skilled Therapeutic Interventions/Progress Updates:  Pt received sitting EOB in room. Denies pain, but states he did not receive therapy over the weekend. Informed pt that he was scheudled for therapy over weekend and pt adamant that he participated. Stand pivot transfer from EOB to [facility] w/RW and CGA. Transported pt from room to main gym w/total A for time management. Pt ambulated 56ft and 40 ft w/RW and CGA for improved endurance and functional mobility with several minute rest break in between trials. Educated pt on diaphragmatic breathing as his main barrier to walking further is feeling short of breath. Pt demonstrated increased kyphotic posture while standing w/RW so adjusted RW. Noted pt has decreased cadence but demonstrated good foot clearance without over reliance on [place]. Pt does well with visual cues for motivation, such as providing him a landmark to walk to. Attempted to walk 3rd time, but pt's landmark became obstructed by other patient in gym, which upset pt. Pt began crying in gym and repeating that he wanted to go home. Unable to console pt in gym to continue therapy. Pt transported in [facility] back to room with total A. Pt was left sitting in WC in room with all needs in reach.   Therapy Documentation Precautions:  Precautions Precautions: Fall Precaution Comments: new R BKA NWB, off COVID precautions Required Braces or Orthoses: Other Brace Knee Immobilizer - Right: On at all times Other Brace: Residual limb protector. Restrictions Weight Bearing Restrictions: Yes RLE Weight Bearing: Non weight bearing   Therapy/Group: Individual Therapy  Ludy Messamore E Zain Bingman Jill Alexanders Markiyah Gahm, PT,  DPT  02/14/2021, 8:15 AM
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
## 2021-02-14 NOTE — Discharge Instructions (Addendum)
Inpatient Rehab Discharge Instructions  Mitchell [name] Discharge date and time: 02/17/21   Activities/Precautions/ Functional Status: Activity: no lifting, driving, or strenuous exercise till cleared by  MD Diet: diabetic diet Wound Care: Wash limb with dial soap, rinse with water, pat dry and apply shrinker. If shrinker is loose, can wrap ace over it. Contact Dr. Lajoyce Corners if you develop any problems with your incision/wound--redness, swelling, increase in pain, drainage or if you develop fever or chills.    Functional status:  ___ No restrictions     ___ Walk up steps independently _X__ 24/7 supervision/assistance   ___ Walk up steps with assistance ___ Intermittent supervision/assistance  ___ Bathe/dress independently ___ Walk with walker     ___ Bathe/dress with assistance ___ Walk Independently    ___ Shower independently ___ Walk with assistance    _X__ Shower with assistance _X__ No alcohol     ___ Return to work/school ________  COMMUNITY REFERRALS UPON DISCHARGE:    Home Health:   PT     OT                    Agency: TBD Phone:    Medical Equipment/Items Ordered: Wheelchair, Agricultural consultant                                                 Agency/Supplier: [employer]   Special Instructions: Monitor blood sugars 2-3 times a day and record. Take results with you to MD visit.    My questions have been answered and I understand these instructions. I will adhere to these goals and the provided educational materials after my discharge from the hospital.  Patient/Caregiver Signature _______________________________ Date __________  Clinician Signature _______________________________________ Date __________  Please bring this form and your medication list with you to all your follow-up doctor's appointments.

## 2021-02-14 NOTE — Progress Notes (Signed)
Physical Therapy Session Note  Patient Details  Name: Mitchell [name] MRN: [id number] Date of Birth: Apr 15, 1971  Today's Date: 02/14/2021 PT Individual Time: 2876-8115 PT Individual Time Calculation (min): 54 min   Short Term Goals: Week 1:  PT Short Term Goal 1 (Week 1): STG = LTG due to LOS  Skilled Therapeutic Interventions/Progress Updates:   Received pt supine in bed, pt agreeable to PT treatment, and initially reported pain along L anterior chest, but upon discussing further discovered it was simply a gas/air bubble. Session with emphasis on functional mobility/transfers, generalized strengthening, dynamic standing balance/coordination, stair navigation, D/C planning, and improved activity tolerance. Pt transferred supine<>sitting EOB with supervision and donned L shoe with max A. Sit<>stand with RW and CGA and ambulated 47ft with RW and CGA into bathroom and able to manage clothing with CGA. Pt able to void on bedside commode. Ambulated 47ft with RW and CGA to WC and sat in WC and washed hands at sink with set up assist. Pt performed WC mobility 133ft using BUE and supervision to therapy gym with 1 rest break after ~116ft due to UE fatigue. Demonstrated technique for stair navigation and pt navigated 2 steps with 2 rails and mod A ascending backwards and descending forwards to simulate home entry. Pt required extensive rest break afterwards and therapist took measurements to fit pt for 20x18 manual WC with R amputee support pad. Discussed energy requirements to navigate 2 steps and discussed getting temporary ramp installed for ease and safety; pt very open to this idea; CSW notified. Plan next was to negotiate curb (as pt reports 1 small curb from parking lot onto sidewalk prior to getting to steps), however, pt suddenly became very drowsy and with difficulty keeping eyes open; therefore deferred curb navigation (will practice another day). Pt performed BUE strengthening on UBE at level 3 for 2  minutes forward and 2 minutes backwards with supervision but required cues for attention to task due to fatigue. Pt transported back to room in [facility] total A and requested to return to bed. Stand<>pivot WC<>bed with RW and CGA and sit<>supine with supervision. Concluded session with pt supine in bed, needs within reach, and bed alarm on.   Therapy Documentation Precautions:  Precautions Precautions: Fall Precaution Comments: new R BKA NWB, off COVID precautions Required Braces or Orthoses: Other Brace Knee Immobilizer - Right: On at all times Other Brace: Residual limb protector. Restrictions Weight Bearing Restrictions: Yes RLE Weight Bearing: Non weight bearing  Therapy/Group: Individual Therapy Martin Majestic PT, DPT   02/14/2021, 7:28 AM

## 2021-02-14 NOTE — Progress Notes (Signed)
Occupational Therapy Session Note  Patient Details  Name: Mitchell [name] MRN: [id number] Date of Birth: [birth year]  Today's Date: 02/14/2021 OT Individual Time: 6629-4765 OT Individual Time Calculation (min): 31 min  and Today's Date: 02/14/2021 OT Missed Time: 29 Minutes Missed Time Reason: Patient fatigue;Pain   Short Term Goals: Week 1:  OT Short Term Goal 1 (Week 1): Pt will donn 1 LB dressing using adpative equipment with Min A OT Short Term Goal 2 (Week 1): Pt will complete sit > stand transfers during self-care tasks with CGA OT Short Term Goal 3 (Week 1): Pt will complete stand pivot toilet transfer with Min A +1 OT Short Term Goal 4 (Week 1): Pt will complete 2 grooming tasks while standing at the sink with CGA  Skilled Therapeutic Interventions/Progress Updates:  Met pt sitting in w/c, chair alarm on, pt presented lethargic and apprehensive to therapy. Pt reported discomfort in chest from previous PT session, nurse provided medication for pain prior to session. Pt expressed wanting to do more but feeling like he could not. OTS offered room activities, pt agreed. Treatment session was focused on limb care. Pt able to doff shrinker with Ind while sitting. OTS gently washed R limb to demonstrate how pt will complete the process. Pt donned shrinker with Ind while sitting. OT and OTS educated pt on desensitization techniques to increase sensation in R limb. Pt requested to return to bed. Pt self-propelled w/c to safe spot for squat pivot transfer. Pt was  CGA for squat pivot transfer to EOB due to impulsivity. Pt was supervision for bed mobility using bed rails to self-adjust. Pt was left with family-friend present, bed alarm on, all needs met.   Therapy Documentation Precautions:  Precautions Precautions: Fall Precaution Comments: new R BKA NWB, off COVID precautions Required Braces or Orthoses: Other Brace Knee Immobilizer - Right: On at all times Other Brace: Residual limb  protector. Restrictions Weight Bearing Restrictions: Yes RLE Weight Bearing: Non weight bearing General: General OT Amount of Missed Time: 29 Minutes Pain: Pain Assessment Pain Scale: 0-10   Therapy/Group: Individual Therapy  Bayard Males 02/14/2021, 12:21 PM

## 2021-02-15 DIAGNOSIS — Z89511 Acquired absence of right leg below knee: Secondary | ICD-10-CM | POA: Diagnosis not present

## 2021-02-15 LAB — GLUCOSE, CAPILLARY
Glucose-Capillary: 133 mg/dL — ABNORMAL HIGH (ref 70–99)
Glucose-Capillary: 128 mg/dL — ABNORMAL HIGH (ref 70–99)
Glucose-Capillary: 109 mg/dL — ABNORMAL HIGH (ref 70–99)
Glucose-Capillary: 126 mg/dL — ABNORMAL HIGH (ref 70–99)

## 2021-02-15 MED ORDER — GABAPENTIN 100 MG PO CAPS
100.0000 mg | ORAL_CAPSULE | Freq: Every day | ORAL | Status: DC
  Administered 2021-02-15 – 2021-02-16 (×2): 100 mg via ORAL
  Filled 2021-02-15 (×2): qty 1

## 2021-02-15 NOTE — Progress Notes (Signed)
Occupational Therapy Session Note  Patient Details  Name: Mitchell [name] MRN: [id number] Date of Birth: 12-25-1970  Today's Date: 02/15/2021 OT Individual Time: 7628-3151 OT Individual Time Calculation (min): 25 min    Short Term Goals: Week 1:  OT Short Term Goal 1 (Week 1): Pt will donn 1 LB dressing using adpative equipment with Min A OT Short Term Goal 2 (Week 1): Pt will complete sit > stand transfers during self-care tasks with CGA OT Short Term Goal 3 (Week 1): Pt will complete stand pivot toilet transfer with Min A +1 OT Short Term Goal 4 (Week 1): Pt will complete 2 grooming tasks while standing at the sink with CGA   Skilled Therapeutic Interventions/Progress Updates:    Pt greeted at times of session sleeping in bed but easily woken and agreeable to OT session. Supine > sit Supervision and squat pivot bed > w/c with CGA. Self propel room <> gym with therapist resuming partially for time conservation. Performed 1x15 of the following at rebounder w/ 2kg weighted ball: standard throw, throw + overhead press, throw + torso twist with O2 sats checked per pt request and remained 99%. Pt wanting to sit up in chair, alarm on call bell in reach and talking on phone with friend.   Therapy Documentation Precautions:  Precautions Precautions: Fall Precaution Comments: new R BKA NWB, off COVID precautions Required Braces or Orthoses: Other Brace Knee Immobilizer - Right: On at all times Other Brace: Residual limb protector. Restrictions Weight Bearing Restrictions: Yes RLE Weight Bearing: Non weight bearing      Therapy/Group: Individual Therapy  Erasmo Score 02/15/2021, 12:53 PM

## 2021-02-15 NOTE — Progress Notes (Signed)
Physical Therapy Session Note  Patient Details  Name: Mitchell [name] MRN: [id number] Date of Birth: [birth year]  Today's Date: 02/15/2021 PT Individual Time: 0917-1015 PT Individual Time Calculation (min): 58 min   Short Term Goals: Week 1:  PT Short Term Goal 1 (Week 1): STG = LTG due to LOS  Skilled Therapeutic Interventions/Progress Updates:  Pt received R sidelying in bed with pillow over face. Reported he was having a lot of pain behind his L eye and that he had received pain meds prior to therapy. Also reported that Dr. Carlis Abbott had advised him to stay in bed and requested for PT to come back later for his therapy. Communicated with nursing regarding pt status and she reported pt had not complained of pain this morning and offered to provide more pain meds. Pt denied extra medication and required lots of encouragement by nursing and therapy for him to participate. Emphasis of session on curb negotiation and car transfers to prepare for DC home.   R sidelying to sitting EOB w/ supervision. Pt dressed UE w/supervision at EOB. Total A to don shoe on LLE. Sit <> stand w/RW and supervision. Stand pivot w/RW to [facility] w/supervision. Pt self-propelled himself in WC from room to ortho gym (~150 ft) w/supervision. Car transfer w/RW and CGA for safety. Pt demonstrated ascending a 4" curb backwards w/RW and CGA and descended curb w/RW and CGA. Encouraged pt to try to walk w/RW, but pt reports walking is what hurt his eye yesterday and made his pain worse. Pt self-propelled himself in WC ~227ft back to room with supervision. Pt was left sitting in WC in room with all needs in reach.   Therapy Documentation Precautions:  Precautions Precautions: Fall Precaution Comments: new R BKA NWB, off COVID precautions Required Braces or Orthoses: Other Brace Knee Immobilizer - Right: On at all times Other Brace: Residual limb protector. Restrictions Weight Bearing Restrictions: Yes RLE Weight Bearing: Non weight  bearing   Therapy/Group: Individual Therapy  [name] E Hanish Laraia Jill Alexanders Kielyn Kardell, PT, DPT  02/15/2021, 7:32 AM

## 2021-02-15 NOTE — Progress Notes (Signed)
Patient ID: Mitchell [name], male   DOB: 24-Feb-1971, 50 y.o.   MRN: [id number] Team Conference Report to Patient/Family  Team Conference discussion was reviewed with the patient and caregiver, including goals, any changes in plan of care and target discharge date.  Patient and caregiver express understanding and are in agreement.  The patient has a target discharge date of 02/17/21.  SW met with patient, called spouse at bedside. Provided conference updates. Patient complaining on pain behind eye. Pt will require eye appointment at d/c.  Ramp recommended, ramp resources provided to pt. [facility] agency's in patient area provided to patient for selections. Pt and spouse prefer DME is delivered to patients home. SW will schedule family education with pt daughter, left VM. No other questions or concerns, sw will cont to follow up.  Dyanne Iha 02/15/2021, 2:39 PM

## 2021-02-15 NOTE — Patient Care Conference (Signed)
Inpatient RehabilitationTeam Conference and Plan of Care Update Date: 02/15/2021   Time: 13:29 PM    Patient Name: Mitchell [name]      Medical Record Number: [id number]  Date of Birth: Aug 12, 1970 Sex: Male         Room/Bed: 4M11C/4M11C-01 Payor Info: Payor: Advertising copywriter / Plan: [employer] / Product Type: *No Product type* /    Admit Date/Time:  02/07/2021  3:55 PM  Primary Diagnosis:  S/P below knee amputation, right [facility])  Hospital Problems: Principal Problem:   S/P below knee amputation, right [facility])    Expected Discharge Date: Expected Discharge Date: 02/17/21  Team Members Present: Physician leading conference: Dr. Sula Soda Care Coodinator Present: Chana Bode, RN, BSN, CRRN;Christina Corcovado, BSW Nurse Present: Chana Bode, RN PT Present: Raechel Chute, PT OT Present: Rosalio Loud, OT PPS Coordinator present : Fae Pippin, SLP     Current Status/Progress Goal Weekly Team Focus  Bowel/Bladder             Swallow/Nutrition/ Hydration             ADL's   UB bathing supervision, LB bathing close supervision, supervision for UB dressing, CGA for LB dressing, Max A for don/doff footwear  Supervision  ADL retraining, Ind limb care, endurance, dynamic standing balance   Mobility   bed mobility supervision, transfers with RW CGA, gait 30ft with RW CGA, WC mobility 142ft supervision  Supervision  Transfers, gait training, stair training, endurance, residual limb care   Communication             Safety/Cognition/ Behavioral Observations            Pain             Skin               Discharge Planning:  Discharging home with spouse, 24/7 avaliable   Team Discussion: Patient with various issues; nose bleed, left ye pain, sleep issues , etc but little pain.  Patient on target to meet rehab goals: yes, Supervision for bed mobility, CGA 40' with RW and manages 2 steps with bil rails and mod assist. Supervision for wheelchair mobility 150'. Able to manage  incision/skin care and has shrinker for residual limb. Supervision goals set for discharge.  *See Care Plan and progress notes for long and short-term goals.   Revisions to Treatment Plan:    Teaching Needs: Medications, safety, skin care, etc  Current Barriers to Discharge: Decreased caregiver support and Home enviroment access/layout  Possible Resolutions to Barriers: Recommend ramp for entry to home Recommend w/c and walker SW to provide resource information to patient for aides at discharge     Medical Summary Current Status: drowsy, pain behind his left eye, epistaxis, insomnia, no more abdominal pain/constipation  Barriers to Discharge: Medical stability;Wound care  Barriers to Discharge Comments: drowsy, pain behind his left eye, epistaxis, insomnia, no more abdominal pain/constipation Possible Resolutions to [employer] Focus: f/u with opthalmology regarding cataracts, Gabapentin HS to help with pain, continue tylenol for pain, continue bowel program   Continued Need for Acute Rehabilitation Level of Care: The patient requires daily medical management by a physician with specialized training in physical medicine and rehabilitation for the following reasons: Direction of a multidisciplinary physical rehabilitation program to maximize functional independence : Yes Medical management of patient stability for increased activity during participation in an intensive rehabilitation regime.: Yes Analysis of laboratory values and/or radiology reports with any subsequent need for medication adjustment and/or medical intervention. :  Yes   I attest that I was present, lead the team conference, and concur with the assessment and plan of the team.   Chana Bode B 02/15/2021, 4:26 PM

## 2021-02-15 NOTE — Progress Notes (Signed)
Occupational Therapy Session Note  Patient Details  Name: Mitchell [name] MRN: [id number] Date of Birth: 1971-06-16  Today's Date: 02/15/2021 OT Individual Time: 2542-7062 OT Individual Time Calculation (min): 29 min    Short Term Goals: Week 1:  OT Short Term Goal 1 (Week 1): Pt will donn 1 LB dressing using adpative equipment with Min A OT Short Term Goal 2 (Week 1): Pt will complete sit > stand transfers during self-care tasks with CGA OT Short Term Goal 3 (Week 1): Pt will complete stand pivot toilet transfer with Min A +1 OT Short Term Goal 4 (Week 1): Pt will complete 2 grooming tasks while standing at the sink with CGA  Skilled Therapeutic Interventions/Progress Updates:  Pt greeted seated in w/c agreeable to OT intervention. Pt requesting to change shrinker, with pt able to complete LB dressing task from w/c level with set up assist, pt also able to wash R residual limb with set- up assist ( warm wash cloth provided with light soap). This OTA washed out dirty shrinker and laid out in BR to dry. Noted sign over pts bed with order for ace wrap over top of shrinker, this OTA wrapped pts residual limb with ace wrap. Pt completed dynamic balance task via bean bag toss with pt able to stand with ~ 5 mins for therapueutic activity. Pt completed lateral scoot transfer back to bed with CGA, where pt was left supine in bed with all needs within reach and bed alarm activated.   Therapy Documentation Precautions:  Precautions Precautions: Fall Precaution Comments: new R BKA NWB, off COVID precautions Required Braces or Orthoses: Other Brace Knee Immobilizer - Right: On at all times Other Brace: Residual limb protector. Restrictions Weight Bearing Restrictions: Yes RLE Weight Bearing: Non weight bearing  Pain:  Pt reports no pain during session.   Therapy/Group: Individual Therapy  Barron Schmid 02/15/2021, 3:39 PM
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
## 2021-02-15 NOTE — Progress Notes (Signed)
Occupational Therapy Discharge Summary  Patient Details  Name: Mitchell [name] MRN: [id number] Date of Birth: [birth year]   Patient has met 69 of 14 long term goals due to improved activity tolerance, improved balance, postural control, ability to compensate for deficits, and improved awareness.  Patient to discharge at overall Supervision level.  Patient's care partner is independent to provide the necessary physical assistance at discharge.  Pt's daughter attended family education and demonstrated competency with assisting pt with functional transfers, ADLs and IADLs. Education provided to pts daughter on residual limb wrapping as well as safe use of gait belt.     Recommendation:  Patient will benefit from ongoing skilled OT services in home health setting to continue to advance functional skills in the area of BADL and Reduce care partner burden.  Equipment: No equipment provided  Reasons for discharge: treatment goals met and discharge from hospital  Patient/family agrees with progress made and goals achieved: Yes  OT Discharge Precautions/Restrictions  Precautions Precautions: Fall Precaution Comments: new R BKA NWB, off COVID precautions Restrictions Weight Bearing Restrictions: Yes RLE Weight Bearing: Non weight bearing General   Vital Signs Therapy Vitals Temp: 98 F (36.7 C) Pulse Rate: 75 BP: (!) 122/94 Oxygen Therapy SpO2: 100 %   ADL ADL Eating: Independent Where Assessed-Eating: Chair Grooming: Modified independent Where Assessed-Grooming: Sitting at sink Upper Body Bathing: Supervision/safety, Setup Where Assessed-Upper Body Bathing: Edge of bed Lower Body Bathing: Supervision/safety, Setup Where Assessed-Lower Body Bathing: Edge of bed Upper Body Dressing: Supervision/safety, Setup Where Assessed-Upper Body Dressing: Edge of bed Lower Body Dressing: Supervision/safety, Setup Where Assessed-Lower Body Dressing: Edge of bed Toileting:  Supervision/safety Where Assessed-Toileting: Glass blower/designer: Close supervision Toilet Transfer Method: Arts development officer: Engineer, technical sales Transfer: Close supervison Clinical cytogeneticist Method: Librarian, academic: Facilities manager: Unable to assess Vision Baseline Vision/History: Wears glasses Wears Glasses: At all times Patient Visual Report: No change from baseline Vision Assessment?: No apparent visual deficits Perception  Perception: Within Functional Limits Praxis Praxis: Intact Cognition Overall Cognitive Status: Within Functional Limits for tasks assessed Arousal/Alertness: Awake/alert Orientation Level: Oriented X4 Attention: Focused;Sustained Focused Attention: Appears intact Sustained Attention: Appears intact Memory: Impaired Awareness: Appears intact Problem Solving: Appears intact Behaviors: Impulsive Safety/Judgment: Appears intact Sensation Sensation Light Touch: Appears Intact Proprioception: Appears Intact Coordination Fine Motor Movements are Fluid and Coordinated: No Finger Nose Finger Test: [facility] Motor  Motor Motor: Within Functional Limits Motor - Skilled Clinical Observations: uncoordinated due to R BKA, decreased balance/postural control, generalized weakness and deconditioning Mobility  Bed Mobility Bed Mobility: Rolling Right;Rolling Left;Sit to Supine;Supine to Sit Rolling Right: Independent Rolling Left: Independent Supine to Sit: Independent Sit to Supine: Independent Transfers Sit to Stand: Supervision/Verbal cueing Stand to Sit: Supervision/Verbal cueing  Trunk/Postural Assessment  Cervical Assessment Cervical Assessment: Exceptions to [facility] (forward head) Thoracic Assessment Thoracic Assessment: Exceptions to [facility] (rounded shoulders) Lumbar Assessment Lumbar Assessment: Exceptions to [facility] (posterior pelvic tilt) Postural Control Postural Control: Deficits  on evaluation  Balance Balance Balance Assessed: Yes Static Sitting Balance Static Sitting - Balance Support: Feet supported;No upper extremity supported Static Sitting - Level of Assistance: 7: Independent Dynamic Sitting Balance Dynamic Sitting - Balance Support: Feet supported;Bilateral upper extremity supported Dynamic Sitting - Level of Assistance: 7: Independent Dynamic Sitting - Balance Activities: Reaching for objects;Lateral lean/weight shifting Static Standing Balance Static Standing - Balance Support: Bilateral upper extremity supported Static Standing - Level of Assistance: 5: Stand by assistance Dynamic Standing Balance Dynamic  Standing - Balance Support: Bilateral upper extremity supported (RW) Dynamic Standing - Level of Assistance: 5: Stand by assistance (supervision) Dynamic Standing - Comments: with transfers Extremity/Trunk Assessment RUE Assessment RUE Assessment: Within Functional Limits Active Range of Motion (AROM) Comments: ~120* shoulder ROM General Strength Comments: grossly 4/5 LUE Assessment LUE Assessment: Within Functional Limits Active Range of Motion (AROM) Comments: ~120* of shoulder ROM General Strength Comments: grossly 4/5   ,  02/16/2021, 6:51 AM

## 2021-02-15 NOTE — Progress Notes (Signed)
Patient ID: Mitchell [name], male   DOB: [birth year], 50 y.o.   MRN: [id number]  Wheelchair and [employer] ordered through [employer].  [place], [place] [phone]

## 2021-02-15 NOTE — Progress Notes (Signed)
Occupational Therapy Session Note  Patient Details  Name: Mitchell [name] MRN: [id number] Date of Birth: 1970/11/30  Today's Date: 02/15/2021 Session 1 OT Individual Time: 1062-6948 OT Individual Time Calculation (min): 26 min   Session 2 OT Individual Time: 1446-1530 OT Individual Time Calculation (min): 44 min    Short Term Goals: Week 1:  OT Short Term Goal 1 (Week 1): Pt will donn 1 LB dressing using adpative equipment with Min A OT Short Term Goal 2 (Week 1): Pt will complete sit > stand transfers during self-care tasks with CGA OT Short Term Goal 3 (Week 1): Pt will complete stand pivot toilet transfer with Min A +1 OT Short Term Goal 4 (Week 1): Pt will complete 2 grooming tasks while standing at the sink with CGA  Skilled Therapeutic Interventions/Progress Updates:  Session 1: Met pt side-lying, bed alarm on, pt hesitant to session. Treatment session was focused on planning family education and d/c planning. Nurse present for medications, pt stated "I didn't even know my food was there". OTS inquired of needed bathroom equipment for upcoming d/c. Pt reported having a shower bench and a toilet riser. Pt requested to attempt transfers. Pt notified youngest daughter to plan for family education for tomorrow. Will contact daughter to make aware of upcoming family education session.   Session 2: Met pt lying in bed, bed alarm on, pt agreed to session. Treatment session was focused on functional transfers. Prior to leaving bed, pt informed daughter of family education schedule, pt and daughter agreed on time. Pt was supervision for bed mobility to EOB, primarily using bed rails to bring UB upright. Pt was supervision for stand pivot from EOB > w/c using RW. Pt self-propelled to rehab apartment. OTS demonstrated safe shower bench transfer. Pt ambulated short distance to shower bench with CGA. Pt complete tub/shower transfer with supervision. Pt able to problem solve to safely get off shower  bench and back to w/c. Pt self-propelled to room to practice transferring from [facility] to simulate toilet height at home. OTS provided cues for safety when entering bathroom due to slope in doorway. Pt completed toilet transfer with supervision. Pt ambulated with RW to bed with CGA, able to self-adjust in bed. Pt left in supine, bed alarm on, all needs met.   Therapy Documentation Precautions:  Precautions Precautions: Fall Precaution Comments: new R BKA NWB, off COVID precautions Required Braces or Orthoses: Other Brace Knee Immobilizer - Right: On at all times Other Brace: Residual limb protector. Restrictions Weight Bearing Restrictions: Yes RLE Weight Bearing: Non weight bearing Vital Signs: Therapy Vitals Temp: 98 F (36.7 C) Temp Source: Oral Pulse Rate: 77 Resp: 18 BP: 136/67 Patient Position (if appropriate): Lying Oxygen Therapy SpO2: 100 % O2 Device: Room Air Pain: Pain Assessment Pain Scale: 0-10 Pain Score: 0-No pain    Therapy/Group: Individual Therapy  Bayard Males 02/15/2021, 4:10 PM

## 2021-02-16 DIAGNOSIS — Z89511 Acquired absence of right leg below knee: Secondary | ICD-10-CM | POA: Diagnosis not present

## 2021-02-16 LAB — CBC
HCT: 32 % — ABNORMAL LOW (ref 39.0–52.0)
Hemoglobin: 10.2 g/dL — ABNORMAL LOW (ref 13.0–17.0)
MCH: 29.5 pg (ref 26.0–34.0)
MCHC: 31.9 g/dL (ref 30.0–36.0)
MCV: 92.5 fL (ref 80.0–100.0)
Platelets: 186 10*3/uL (ref 150–400)
RBC: 3.46 MIL/uL — ABNORMAL LOW (ref 4.22–5.81)
RDW: 13 % (ref 11.5–15.5)
WBC: 6.7 10*3/uL (ref 4.0–10.5)
nRBC: 0 % (ref 0.0–0.2)

## 2021-02-16 LAB — MAGNESIUM: Magnesium: 1.8 mg/dL (ref 1.7–2.4)

## 2021-02-16 LAB — GLUCOSE, CAPILLARY
Glucose-Capillary: 89 mg/dL (ref 70–99)
Glucose-Capillary: 143 mg/dL — ABNORMAL HIGH (ref 70–99)
Glucose-Capillary: 97 mg/dL (ref 70–99)
Glucose-Capillary: 128 mg/dL — ABNORMAL HIGH (ref 70–99)
Glucose-Capillary: 276 mg/dL — ABNORMAL HIGH (ref 70–99)

## 2021-02-16 MED ORDER — BETAMETHASONE SOD PHOS & ACET 6 (3-3) MG/ML IJ SUSP
12.0000 mg | Freq: Once | INTRAMUSCULAR | Status: AC
  Administered 2021-02-16: 12 mg via INTRA_ARTICULAR
  Filled 2021-02-16: qty 2

## 2021-02-16 MED ORDER — BUPIVACAINE HCL (PF) 0.25 % IJ SOLN
10.0000 mL | Freq: Once | INTRAMUSCULAR | Status: AC
  Administered 2021-02-16: 10 mL
  Filled 2021-02-16: qty 10

## 2021-02-16 NOTE — Progress Notes (Signed)
Physical Therapy Discharge Summary  Patient Details  Name: Mitchell [name] MRN: [id number] Date of Birth: 1971-04-28  Today's Date: 02/16/2021 PT Individual Time: 1300-1400 PT Individual Time Calculation (min): 60 min   Today's Date: 02/16/2021 PT Missed Time: 15 Minutes Missed Time Reason: Patient fatigue;Patient unwilling to participate  Patient has met 8 of 11 long term goals due to improved activity tolerance, improved balance, improved postural control, increased strength, ability to compensate for deficits, improved awareness, and improved coordination. Patient to discharge at a wheelchair level Supervision. Patient's care partner is independent to provide the necessary physical assistance at discharge. Pt's daughter attended family education training on 7/20 and verbalized and demonstrated confidence with all tasks to ensure safe discharge home.   Reasons goals not met: Pt did not meet either gait goals as he is currently only able to ambulate up to 4f with RW and CGA; limited by fatigue, weakness, decreased endurance, and balance deficits. Pt did not meet stair goal of 2 steps with 2 rails and supervision as pt currently requires mod A to navigate 2 steps due to fatigue, generalized weakness and deconditioning, and decreased balance.   Recommendation:  Patient will benefit from ongoing skilled PT services in home health setting to continue to advance safe functional mobility, address ongoing impairments in transfers, generalized strengthening, dynamic standing balance/coordination, gait training, limb loss education, endurance, and to minimize fall risk.  Equipment: 20x18 manual WC with R amputee support pad and standard legrest, RW . Recommending ramp for home entry.  Reasons for discharge: treatment goals met and discharge from hospital  Patient/family agrees with progress made and goals achieved: Yes  Today's Interventions: Received pt supine in bed, pt agreeable to PT treatment,  and denied any pain during session. Session with emphasis on discharge planning, functional mobility/transfers, generalized strengthening, dynamic standing balance/coordination, gait training, stair navigation, and improved activity tolerance. Pt called daughter to see if she would be back for PM PT session but pt's daughter currently taking care of pt's mother in law. Pt transferred supine<>sitting EOB from flat bed without bedrails independently and donned L shoe with max A.  Stand<>pivot bed<>WC with RW and supervision and pt performed WC mobility 159fusing BUE mod I to main therapy gym with 2 rest breaks due to UE fatigue. Provided pt with WC gloves to protect hands. Pt able to perform all sit<>stands with RW and close supervision throughout session. Pt then navigated 2 steps with 2 rails and mod A ascending backwards and descending forwards. Pt with increased difficulty clearing LLE due to UE weakness and therapist reiterated recommendation to get ramp; pt reports that this is the plan. Pt then ambulated 2040fith RW and CGA; demonstrated decreased LLE foot clearance with fatigue and requested to sit due to SOB. [facility] and transferred on/off Nustep with RW and supervision. Pt performed BUE and LLE strengthening on Nustep at workload 6 for 8 minutes for a total of 436 steps for improved cardiovascular endurance with 4 rest breaks due to RUE fatigue and SOB. Pt requested to return to room 2/2 fatigue and [facility]<>bed stand<>pivot with RW and supervision and sit<>supine independently. Concluded session with pt supine in bed, needs within reach, and bed alarm on. Pt reported no concerns and feels prepared for discharge tomorrow but requested to speak with CSW; CSW notified. 15 minutes missed of skilled physical therapy due to fatigue/unwillingness to participate.   PT  Discharge Precautions/Restrictions Precautions Precautions:  Fall Precaution Comments: R BKA Restrictions Weight Bearing Restrictions: Yes RLE Weight Bearing: Non weight bearing Cognition Overall Cognitive Status: Within Functional Limits for tasks assessed Arousal/Alertness: Awake/alert Orientation Level: Oriented X4 Memory: Impaired Awareness: Appears intact Problem Solving: Appears intact Behaviors: Impulsive Safety/Judgment: Appears intact Sensation Sensation Light Touch: Appears Intact Proprioception: Appears Intact Coordination Gross Motor Movements are Fluid and Coordinated: No Fine Motor Movements are Fluid and Coordinated: Yes Coordination and Movement Description: uncoordinated due to R BKA, decreased balance/postural control, generalized weakness and deconditioning Finger Nose Finger Test: [facility] bilaterally Heel Shin Test: WFL on LLE, unable to perform on RLE due to BKA Motor  Motor Motor: Abnormal postural alignment and control Motor - Skilled Clinical Observations: uncoordinated due to R BKA, decreased balance/postural control, generalized weakness and deconditioning  Mobility Bed Mobility Bed Mobility: Rolling Right;Rolling Left;Sit to Supine;Supine to Sit Rolling Right: Independent Rolling Left: Independent Supine to Sit: Independent Sit to Supine: Independent Transfers Transfers: Sit to Stand;Stand to Sit;Stand Pivot Transfers Sit to Stand: Supervision/Verbal cueing Stand to Sit: Supervision/Verbal cueing Stand Pivot Transfers: Supervision/Verbal cueing Transfer (Assistive device): Rolling walker Locomotion  Gait Ambulation: Yes Gait Assistance: Contact Guard/Touching assist Gait Distance (Feet): 40 Feet Assistive device: Rolling walker Gait Assistance Details: Verbal cues for gait pattern;Verbal cues for technique;Verbal cues for safe use of DME/AE Gait Assistance Details: verbal cues to land softly on LLE to prevent excessive forces Gait Gait: Yes Gait Pattern: Impaired Gait Pattern: Decreased step length -  left;Poor foot clearance - left;Trunk flexed;Decreased stride length;Decreased trunk rotation Gait velocity: decreased Stairs / Additional Locomotion Stairs: Yes Stairs Assistance: Moderate Assistance - Patient 50 - 74% Stair Management Technique: Two rails Number of Stairs: 2 Height of Stairs: 6 Curb: Contact Guard/Touching assist (RW) Product manager Mobility: Yes Wheelchair Assistance: Independent with Camera operator: Both upper extremities Wheelchair Parts Management: Supervision/cueing Distance: 131f  Trunk/Postural Assessment  Cervical Assessment Cervical Assessment: Exceptions to [facility]) Thoracic Assessment Thoracic Assessment: Exceptions to [facility]) Lumbar Assessment Lumbar Assessment: Exceptions to [facility](posterior pelvic tilt) Postural Control Postural Control: Deficits on evaluation  Balance Balance Balance Assessed: Yes Static Sitting Balance Static Sitting - Balance Support: Feet supported;No upper extremity supported Static Sitting - Level of Assistance: 7: Independent Dynamic Sitting Balance Dynamic Sitting - Balance Support: Feet supported;Bilateral upper extremity supported Dynamic Sitting - Level of Assistance: 7: Independent Static Standing Balance Static Standing - Balance Support: Bilateral upper extremity supported (RW) Static Standing - Level of Assistance: 5: Stand by assistance (supervision) Dynamic Standing Balance Dynamic Standing - Balance Support: Bilateral upper extremity supported (RW) Dynamic Standing - Level of Assistance: 5: Stand by assistance (supervision) Dynamic Standing - Comments: with transfers Extremity Assessment  RLE Assessment RLE Assessment: Ex[facility] to [facility] Strength Comments: grossly generalized to 4+/5 (hip flexion/abduction/adduction, and knee extension) LLE Assessment LLE Assessment: Exceptions to [facility] St[facility] Comments: grossly generalized to  4+/5  AAlfonse AlpersPT, DPT  02/16/2021, 7:37 AM

## 2021-02-16 NOTE — Procedures (Signed)
Shoulder injectionLeft subacromial bursa without ultrasound guidance)  Indication:Left Shoulder pain not relieved by medication management and other conservative care.  Informed consent was obtained after describing risks and benefits of the procedure with the patient, this includes bleeding, bruising, infection and medication side effects. The patient wishes to proceed and has given written consent. Patient was placed in a seated position. TheLeft shoulder was marked and prepped with chlorhexadine in the subacromial area. A 25-gauge 1-1/2 inch needle was inserted into the subacromial area. After negative draw back for blood, a solution containing 1 mL of 6 mg per ML betamethasone and 4 mL of .25% marcaine was injected. A band aid was applied. The patient tolerated the procedure well. Post procedure instructions were given.

## 2021-02-16 NOTE — Discharge Summary (Signed)
Physician Discharge Summary  Patient ID: Mitchell [name] MRN: [id number] DOB/AGE: 04/09/1971 50 y.o.  Admit date: 02/07/2021 Discharge date: 02/17/2021  Discharge Diagnoses:  Principal Problem:   S/P below knee amputation, right [facility]) Active Problems:   Diabetes mellitus with complication [facility])   Essential hypertension   Chronic systolic heart failure (HCC)   AKI (acute kidney injury) ([place])   Discharged Condition: good  Significant Diagnostic Studies: N/A   Labs:  Basic Metabolic Panel: BMP Latest Ref Rng & Units 02/14/2021 02/08/2021 02/07/2021  Glucose 70 - 99 mg/dL 138(H) 99 132(H)  BUN 6 - 20 mg/dL 36(H) 32(H) 34(H)  Creatinine 0.61 - 1.24 mg/dL 1.82(H) 2.16(H) 2.47(H)  Sodium 135 - 145 mmol/L 138 139 137  Potassium 3.5 - 5.1 mmol/L 4.2 4.5 4.9  Chloride 98 - 111 mmol/L 102 106 104  CO2 22 - 32 mmol/L _0 Calcium 8.9 - 10.3 mg/dL 9.1 8.7(L) 8.8(L)     CBC: CBC Latest Ref Rng & Units 02/16/2021 02/08/2021 02/05/2021  WBC 4.0 - 10.5 K/uL 6.7 8.6 12.1(H)  Hemoglobin 13.0 - 17.0 g/dL 10.2(L) 9.8(L) 9.5(L)  Hematocrit 39.0 - 52.0 % 32.0(L) 31.0(L) 30.8(L)  Platelets 150 - 400 K/uL 186 307 385     CBG: Recent Labs  Lab 02/16/21 0831 02/16/21 1132 02/16/21 1639 02/16/21 2128 02/17/21 0642  GLUCAP 143* 97 128* 276* 186*    Brief HPI:   Mitchell [name] is a 50 y.o. male, AICD, COVID diagnosis 5 days prior to admission on 01/23/2021.  He was noted to be septic from left foot infection, was febrile, had elevated troponin and was COVID-positive.  Left BKA recommended due to right foot abscess with purulent drainage however patient declined surgery initially.  He was treated with broad-spectrum antibiotics as well as remdesivir.  Cardiology consulted for input on NSTEMI and he was treated with diuresis for fluid overload and felt to be high surgical risk for regional anesthesia.  Dr. Carolin Sicks was consulted for input on acute renal failure which was  felt to bemultifactorial  and medications were adjusted in addition to IV fluids for gentle hydration.   He was agreeable to undergo right BKA on 06/28 by Dr. Sharol Given.   He underwent right cardiac cath on 06/30 revealing  EF 20-25%, normal filling pressures and normal cardiac output.  He declined AICD placement. He did have episode of right facial droop with weakness in right hand felt to be vasovagal in nature as symptoms resolved.  Patient refused MRI of brain and.  EEG was negative for seizures.  Medical issues had been optimized and patient was noted to have continued limitations due to weakness, poor safety awareness, decrease in initiation as well as deficits in mobility due to left BKA.  CIR was recommended due to functional decline.   Hospital Course: Mitchell [name] was admitted to rehab 02/07/2021 for inpatient therapies to consist of PT and OT at least three hours five days a week. Past admission physiatrist, therapy team and rehab RN have worked together to provide customized collaborative inpatient rehab. His blood pressures were monitored on TID basis and has been stable.  Follow-up CBC shows leukocytosis has resolved and acute blood loss anemia slowly improving.  Serial check of electrolytes shows shows that acute on chronic renal failure is resolving.  He was found to have denuded blisters on his stump at admission.  Ortho was contacted for input and recommended Silvadene cream which was used with dry dressing.  Currently denuded areas  are covered with dark scab and stump shrinker and compressive dressings are being used daily.  Pain control has been reasonable with as needed use of oxycodone.    His diabetes has been monitored with ac/hs CBG checks and SSI was use prn for tighter BS control.  P.o. intake has been good and blood sugars are reasonably controlled on 21 units of Lantus twice daily.  He was advised to continue monitoring blood sugars twice daily to 3 times daily basis and follow-up with PCP for further  titration of his insulin.  He has had left shoulder pain due to impingement syndrome and subacromial bursa was injected on 07/20 with 6 mg of betamethasone. His progress has been limited by fatigue and inconsistent participation. He currently requires supervision at wheelchair level and will continue to receive follow up [facility] after dicharge.    Rehab course: During patient's stay in rehab weekly team conferences were held to monitor patient's progress, set goals and discuss barriers to discharge. At admission, patient required min assist with basic ADL tasks and mod assist with mobility. He  has had improvement in activity tolerance, balance, postural control as well as ability to compensate for deficits.  He is able to complete ADL tasks with set up assist and requires supervision for shower transfers. He requires supervision for transfers and is able to ambulate 20-40 feet with CGA and RW. Family education was completed with daughter.   Disposition: Home  Diet:  Heart Healthy/Carb Modified.   Special Instructions:  Discharge Instructions     Ambulatory referral to Physical Medicine Rehab   Complete by: As directed       Allergies as of 02/17/2021       Reactions   Nsaids Anaphylaxis, Other (See Comments)   Able to tolerate aspirin         Medication List     STOP taking these medications    insulin aspart 100 UNIT/ML FlexPen Commonly known as: NOVOLOG       TAKE these medications    Accu-Chek FastClix Lancet Kit Use to monitor glucose levels BID   Accu-Chek Guide Me w/Device Kit 1 each by Does not apply route 2 (two) times daily. Use to monitor glucose levels BID   Accu-Chek Guide test strip Generic drug: glucose blood 1 each by Other route in the morning and at bedtime. And lancets 2/day.   acetaminophen 325 MG tablet Commonly known as: TYLENOL Take 1-2 tablets (325-650 mg total) by mouth every 8 (eight) hours. What changed:   how much to take when to take this reasons to take this   ascorbic acid 250 MG tablet Commonly known as: VITAMIN C Take 1 tablet (250 mg total) by mouth daily. Start taking on: February 18, 2021 Notes to patient: Over the counter   aspirin 81 MG EC tablet Take 1 tablet (81 mg total) by mouth daily. Notes to patient: Over the counter   carvedilol 6.25 MG tablet Commonly known as: COREG Take 1 tablet (6.25 mg total) by mouth 2 (two) times daily with a meal. What changed:  how much to take how to take this when to take this additional instructions   docusate sodium 100 MG capsule Commonly known as: COLACE Take 1 capsule (100 mg total) by mouth 3 (three) times daily. Notes to patient: Over the counter   gabapentin 100 MG capsule Commonly known as: NEURONTIN Take 1 capsule (100 mg total) by mouth at bedtime.   hydrocerin  Crea Apply 1 application topically 2 (two) times daily. To left foot/leg   insulin glargine 100 UNIT/ML injection Commonly known as: LANTUS Inject 0.21 mLs (21 Units total) into the skin 2 (two) times daily. THIS IS THE SAME AS BASIGLAR--THE INSULIN IS DIVIDED INTO TWICE A DAY FOR BETTER COVERAGE. What changed:  how much to take additional instructions   Lancets 28G Misc Use for glucose testing up to BID   methocarbamol 500 MG tablet Commonly known as: ROBAXIN Take 1 tablet (500 mg total) by mouth every 6 (six) hours as needed for muscle spasms.   multivitamin with minerals Tabs tablet Take 1 tablet by mouth daily.   nutrition supplement (JUVEN) Pack Take 1 packet by mouth 2 (two) times daily between meals.   oxyCODONE-acetaminophen 7.5-325 MG tablet--Rx 21 pills Commonly known as: PERCOCET Take 1 tablet by mouth every 8 (eight) hours as needed for severe pain. Notes to patient: LIMIT TO TWO PILLS PER DAY AS NEEDED. USE MUSCLE RELAXER FOR MUSCLE PAIN/TIGHTNESS/SPASMS.    pantoprazole 40 MG tablet Commonly known as: PROTONIX Take 1 tablet (40 mg  total) by mouth daily.   polyethylene glycol 17 g packet Commonly known as: MIRALAX / GLYCOLAX Take 17 g by mouth daily. Start taking on: February 18, 2021   rosuvastatin 20 MG tablet Commonly known as: Crestor Take 1 tablet (20 mg total) by mouth daily.   senna 8.6 MG Tabs tablet Commonly known as: SENOKOT Take 2 tablets (17.2 mg total) by mouth at bedtime. Notes to patient: Laxative--over the counter   simethicone 80 MG chewable tablet Commonly known as: MYLICON Chew 1 tablet (80 mg total) by mouth 4 (four) times daily. Notes to patient: Over the counter--for gas/bloating   torsemide 10 MG tablet Commonly known as: DEMADEX Take 1 tablet (10 mg total) by mouth daily.        Follow-up Information     [name], [name], MD Follow up.   Specialty: Physical Medicine and Rehabilitation Why: 04/15/21 please arrive at 2:40pm for 3:00pm appointment Contact information: [address] [place] [address] [phone]         [name], MD. Call in 1 day(s).   Specialty: Family Medicine Why: for post hospital follow up Contact information: [address] [place] [address] [phone]         Bensimhon, Shaune Pascal, MD .   Specialty: Cardiology Contact information: [address] [place] [address] [phone]         [name], MD. Call in 1 day(s).   Specialty: Orthopedic Surgery Why: for post op appointment and staple removal Contact information: [address] [place] [address] [phone]                 Signed: Bary Leriche 02/17/2021, 6:05 PM
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
## 2021-02-16 NOTE — Progress Notes (Signed)
Occupational Therapy Session Note  Patient Details  Name: Mitchell [name] MRN: [id number] Date of Birth: Jul 30, 1971  Today's Date: 02/16/2021 OT Individual Time: 1000-1100 OT Individual Time Calculation (min): 60 min    Short Term Goals: Week 1:  OT Short Term Goal 1 (Week 1): Pt will donn 1 LB dressing using adpative equipment with Min A OT Short Term Goal 2 (Week 1): Pt will complete sit > stand transfers during self-care tasks with CGA OT Short Term Goal 3 (Week 1): Pt will complete stand pivot toilet transfer with Min A +1 OT Short Term Goal 4 (Week 1): Pt will complete 2 grooming tasks while standing at the sink with CGA  Skilled Therapeutic Interventions/Progress Updates:  Pt received supine in bed agreeable to OT intervention with pts daughter present for family education session. Pt currently required CGA for bed mobility from flat HOB. Instruction and demo provided of stand pivot transfer from EOB>w/c with pt able to complete transfer with CGA with RW. Education provided on pts current level of assist with UB/LB dressing, pt demo'ed technique of donning<>doffing shrinker and cleaning incision with daughter observing task. MD confirmed pt should be ace wrapping residual limb over shrinker so began education to daughter on how to wrap residual limb, pts daughter would benefit from further practice/ education. Pt able to complete w/c propulsion to shower room in ADL apartment where pts daughter assisted pt with shower transfer to TTB in tub shower. Provided education on how to assist pt with gait belt as well as education related to fall prevention techniques in BR. Briefly discussed mobility in kitchen from w/c level with pts daughter reporting that their kitchen at home is already w/c accessible. Education and demo provided on Nurse, adult for IADLs such as laundry with pt verbalizing understanding. Pts daughter reports no further questions in regard to ADLs/ functional mobility, pt left  seated in w/c with alarm pad activated and all needs within reach.   Therapy Documentation Precautions:  Precautions Precautions: Fall Precaution Comments: R BKA Required Braces or Orthoses: Other Brace Knee Immobilizer - Right: On at all times Other Brace: Residual limb protector. Restrictions Weight Bearing Restrictions: Yes RLE Weight Bearing: Non weight bearing General:   Vital Signs:  Pain: Pt reports no pain during session.    Therapy/Group: Individual Therapy  Barron Schmid 02/16/2021, 12:13 PM

## 2021-02-17 DIAGNOSIS — Z89511 Acquired absence of right leg below knee: Secondary | ICD-10-CM | POA: Diagnosis not present

## 2021-02-17 LAB — GLUCOSE, CAPILLARY: Glucose-Capillary: 186 mg/dL — ABNORMAL HIGH (ref 70–99)

## 2021-02-17 MED ORDER — SIMETHICONE 80 MG PO CHEW: 80.0000 mg | CHEWABLE_TABLET | Freq: Four times a day (QID) | ORAL | 0 refills | Status: DC

## 2021-02-17 MED ORDER — PANTOPRAZOLE SODIUM 40 MG PO TBEC: 40.0000 mg | DELAYED_RELEASE_TABLET | Freq: Every day | ORAL | 0 refills | Status: DC

## 2021-02-17 MED ORDER — INSULIN GLARGINE 100 UNIT/ML ~~LOC~~ SOLN: 21.0000 [IU] | Freq: Two times a day (BID) | SUBCUTANEOUS | 11 refills | Status: DC

## 2021-02-17 MED ORDER — HYDROCERIN EX CREA: 1.0000 "application " | TOPICAL_CREAM | Freq: Two times a day (BID) | CUTANEOUS | 0 refills | Status: DC

## 2021-02-17 MED ORDER — SENNA 8.6 MG PO TABS: 2.0000 | ORAL_TABLET | Freq: Every day | ORAL | 0 refills | Status: DC

## 2021-02-17 MED ORDER — DOCUSATE SODIUM 100 MG PO CAPS: 100.0000 mg | ORAL_CAPSULE | Freq: Three times a day (TID) | ORAL | 0 refills | Status: DC

## 2021-02-17 MED ORDER — METHOCARBAMOL 500 MG PO TABS: 500.0000 mg | ORAL_TABLET | Freq: Four times a day (QID) | ORAL | 0 refills | Status: DC | PRN

## 2021-02-17 MED ORDER — OXYCODONE-ACETAMINOPHEN 7.5-325 MG PO TABS: 1.0000 | ORAL_TABLET | Freq: Three times a day (TID) | ORAL | 0 refills | Status: DC | PRN

## 2021-02-17 MED ORDER — TORSEMIDE 10 MG PO TABS: 10.0000 mg | ORAL_TABLET | Freq: Every day | ORAL | 0 refills | Status: DC

## 2021-02-17 MED ORDER — CARVEDILOL 6.25 MG PO TABS: 6.2500 mg | ORAL_TABLET | Freq: Two times a day (BID) | ORAL | 0 refills | Status: DC

## 2021-02-17 MED ORDER — ACETAMINOPHEN 325 MG PO TABS: 325.0000 mg | ORAL_TABLET | Freq: Three times a day (TID) | ORAL | Status: DC

## 2021-02-17 MED ORDER — ASCORBIC ACID 250 MG PO TABS: 250.0000 mg | ORAL_TABLET | Freq: Every day | ORAL | 0 refills | Status: DC

## 2021-02-17 MED ORDER — INSULIN GLARGINE 100 UNIT/ML ~~LOC~~ SOLN
21.0000 [IU] | Freq: Two times a day (BID) | SUBCUTANEOUS | Status: DC
  Filled 2021-02-17: qty 0.21

## 2021-02-17 MED ORDER — GABAPENTIN 100 MG PO CAPS: 100.0000 mg | ORAL_CAPSULE | Freq: Every day | ORAL | 0 refills | Status: DC

## 2021-02-17 MED ORDER — JUVEN PO PACK: 1.0000 | PACK | Freq: Two times a day (BID) | ORAL | 0 refills | Status: DC

## 2021-02-17 MED ORDER — POLYETHYLENE GLYCOL 3350 17 G PO PACK: 17.0000 g | PACK | Freq: Every day | ORAL | 0 refills | Status: DC

## 2021-02-17 NOTE — Progress Notes (Signed)
Inpatient Rehabilitation Care Coordinator Discharge Note  The overall goal for the admission was met for:   Discharge location: Yes, home  Length of Stay: Yes, 10 Days  Discharge activity level: Yes  Home/community participation: Yes  Services provided included: MD, RD, PT, OT, SLP, RN, CM, TR, Pharmacy, Neuropsych, and SW  Financial Services: Private Insurance: [place] offered to/list presented [place]:[id number]  Follow-up services arranged: Home Health: TBD  Comments (or additional information): PT OT  Wheelchair, Rolling Walker  Patient/Family verbalized understanding of follow-up arrangements: Yes  Individual responsible for coordination of the follow-up plan: patient, [phone]  Confirmed correct DME delivered: Dyanne Iha 02/17/2021    Dyanne Iha

## 2021-02-17 NOTE — Progress Notes (Addendum)
Patient ID: Mitchell [name], male   DOB: 14-Aug-1970, 50 y.o.   MRN: [id number]  Per patient request SW will sent out [facility] referral to agency's for selection. SW will follow up with patient with approval.  Patient referral sent to [facility] [place], [place] [phone]
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
## 2021-02-17 NOTE — Progress Notes (Signed)
Patient ID: Mitchell [name], male   DOB: April 19, 1971, 50 y.o.   MRN: [id number]  Patient accepted by [facility] Monday  [place], [place] [phone]
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
## 2021-02-21 ENCOUNTER — Telehealth: Payer: Self-pay

## 2021-02-21 ENCOUNTER — Ambulatory Visit (INDEPENDENT_AMBULATORY_CARE_PROVIDER_SITE_OTHER): Payer: No Typology Code available for payment source | Admitting: Orthopedic Surgery

## 2021-02-21 DIAGNOSIS — Z89511 Acquired absence of right leg below knee: Secondary | ICD-10-CM

## 2021-02-21 NOTE — Telephone Encounter (Signed)
Transition Care Management Follow-up Telephone Call Date of discharge and from where: 02/17/21- How have you been since you were released from the hospital? Doin gok but fell last night. He is on his way to the surgeon's office now because he feel on his amputated leg. Any questions or concerns? No  Items Reviewed: Did the pt receive and understand the discharge instructions provided? Yes  Medications obtained and verified? Yes  Other? Yes  Any new allergies since your discharge? No  Dietary orders reviewed? Yes Do you have support at home? Yes   Home Care and Equipment/Supplies: Were home health services ordered? yes If so, what is the name of the agency? [facility]  Has the agency set up a time to come to the patient's home? yes Were any new equipment or medical supplies ordered?  No What is the name of the medical supply agency? N/a Were you able to get the supplies/equipment? not applicable Do you have any questions related to the use of the equipment or supplies? N/a  Functional Questionnaire: (I = Independent and D = Dependent) ADLs: I with assistance  Bathing/Dressing- I with assistance  Meal Prep- D  Eating- I  Maintaining continence- I   Transferring/Ambulation- I with assistance  Managing Meds- I  Follow up appointments reviewed:  PCP Hospital f/u appt confirmed? Yes  Scheduled to see Dr. Patsy Lager on 02/24/21 @ 11:00. Specialist Hospital f/u appt confirmed? Yes  Scheduled to see Dr. Lajoyce Corners on 02/21/21 @ 3:45. Are transportation arrangements needed? No  If their condition worsens, is the pt aware to call PCP or go to the Emergency Dept.? Yes Was the patient provided with contact information for the PCP's office or ED? Yes Was to pt encouraged to call back with questions or concerns? Yes

## 2021-02-22 ENCOUNTER — Encounter: Payer: Self-pay | Admitting: Orthopedic Surgery

## 2021-02-22 NOTE — Progress Notes (Signed)
Office Visit Note   Patient: Mitchell [name]           Date of Birth: May 02, 1971           MRN: [id number] Visit Date: 02/21/2021              Requested by: Pearline Cables, MD [address] [place],  [place] [address] PCP: Pearline Cables, MD  Chief Complaint  Patient presents with   Right Leg - Routine Post Op    01/25/21 right BKA       HPI: Patient is a 50 year old gentleman who is 4 weeks status post right transtibial amputation.  Patient states he fell directly on the residual limb he has a small amount of drainage and bleeding medially.  He has been using his stump shrinker he stopped the wound protector stating the hospital advised him that it caused blisters.  Assessment & Plan: Visit Diagnoses:  1. S/P BKA (below knee amputation) unilateral, right (HCC)     Plan: Recommended continue using the limb protector will continue with the stump shrinker follow-up in 2 weeks to remove the staples.  Follow-Up Instructions: Return in about 2 weeks (around 03/07/2021).   Ortho Exam  Patient is alert, oriented, no adenopathy, well-dressed, normal affect, normal respiratory effort. Examination there is no wound dehiscence no ischemic changes there is small wound breakdown medially this was touched with silver nitrate.  Imaging: No results found. No images are attached to the encounter.  Labs: Lab Results  Component Value Date   HGBA1C 8.1 (H) 01/23/2021   HGBA1C 8.1 (H) 08/04/2020   HGBA1C 7.9 (A) 03/30/2020   ESRSEDRATE 81 (H) 10/21/2019   ESRSEDRATE 25 (H) 06/17/2018   ESRSEDRATE 29 (H) 05/28/2018   CRP 10.3 (H) 01/28/2021   CRP 12.8 (H) 01/27/2021   CRP 21.1 (H) 01/26/2021   REPTSTATUS 01/25/2021 FINAL 01/24/2021   GRAMSTAIN  08/29/2018    RARE WBC PRESENT, PREDOMINANTLY PMN NO ORGANISMS SEEN    CULT  01/24/2021    NO GROWTH Performed at [facility], [address] [place], [place] [address]    LABORGA KLEBSIELLA PNEUMONIAE  05/21/2018   LABORGA ENTEROBACTER SPECIES 05/21/2018   LABORGA STAPHYLOCOCCUS AUREUS 05/21/2018     Lab Results  Component Value Date   ALBUMIN 2.3 (L) 02/08/2021   ALBUMIN 2.2 (L) 02/05/2021   ALBUMIN 2.1 (L) 02/04/2021   PREALBUMIN 6.8 (L) 10/21/2019    Lab Results  Component Value Date   MG 1.8 02/16/2021   MG 1.9 02/14/2021   MG 1.9 02/11/2021   Lab Results  Component Value Date   VD25OH 11 (L) 08/16/2009    Lab Results  Component Value Date   PREALBUMIN 6.8 (L) 10/21/2019   CBC EXTENDED Latest Ref Rng & Units 02/16/2021 02/08/2021 02/05/2021  WBC 4.0 - 10.5 K/uL 6.7 8.6 12.1(H)  RBC 4.22 - 5.81 MIL/uL 3.46(L) 3.32(L) 3.28(L)  HGB 13.0 - 17.0 g/dL 10.2(L) 9.8(L) 9.5(L)  HCT 39.0 - 52.0 % 32.0(L) 31.0(L) 30.8(L)  PLT 150 - 400 K/uL 186 307 385  NEUTROABS 1.7 - 7.7 K/uL - 6.4 9.4(H)  LYMPHSABS 0.7 - 4.0 K/uL - 1.4 1.5     There is no height or weight on file to calculate BMI.  Orders:  No orders of the defined types were placed in this encounter.  No orders of the defined types were placed in this encounter.    Procedures: No procedures performed  Clinical Data:  No additional findings.  ROS:  All other systems negative, except as noted in the HPI. Review of Systems  Objective: Vital Signs: There were no vitals taken for this visit.  Specialty Comments:  No specialty comments available.  PMFS History: Patient Active Problem List   Diagnosis Date Noted   S/P below knee amputation, right (HCC) 02/07/2021   Cutaneous abscess of right foot    Diabetic foot infection (HCC)    SOB (shortness of breath)    Sepsis (HCC) 01/23/2021   Cellulitis 01/23/2021   Elevated troponin 01/23/2021   Acute metabolic encephalopathy 01/23/2021   AKI (acute kidney injury) (HCC) 01/23/2021   COVID-19 virus infection 01/23/2021   Wound infection 10/21/2019   Burn, foot, second degree, unspecified laterality, sequela 10/21/2019   QT prolongation 07/30/2019   Acute  exacerbation of CHF (congestive heart failure) (HCC) 07/30/2019   Acute on chronic combined systolic and diastolic CHF (congestive heart failure) (HCC) 07/29/2019   Foot ulcer (HCC) 03/11/2018   Weakness 05/08/2017   Leukocytosis 05/08/2017   Stroke (HCC) 05/08/2017   Slurred speech 05/08/2017   Uncontrolled type 2 diabetes mellitus with hyperglycemia, with long-term current use of insulin (HCC) 12/07/2015   Noncompliance 01/22/2014   Chronic systolic heart failure (HCC) 10/02/2011   Erectile dysfunction 10/02/2011   Obstructive sleep apnea 10/11/2007   Diabetes mellitus with complication (HCC) 10/10/2007   HLD (hyperlipidemia) 10/10/2007   HYPOKALEMIA 10/10/2007   Obesity, Class III, BMI 40-49.9 (morbid obesity) (HCC) 10/10/2007   Essential hypertension 10/10/2007   PREMATURE VENTRICULAR CONTRACTIONS 10/10/2007   SYNCOPE 10/10/2007   COLONIC POLYPS, HX OF 10/10/2007   Past Medical History:  Diagnosis Date   Bipolar disorder (HCC)    CAD (coronary artery disease)    a. diffuse 3v CAD by cath 2019, medical therapy recommended.   Cataract    forming    Chronic systolic CHF (congestive heart failure) (HCC)    CVA (cerebral infarction)    No residual deficits   Depression    PTSD,    Diabetes mellitus    TYPE II; UNCONTROLLED BY HEMOGLOBIN A1c; STABLE AS  PER DISCHARGE   Headache(784.0)    Herpes simplex of male genitalia    History of colonic polyps    Hyperlipidemia    Hypertension    Myocardial infarction (HCC) 1987   (while playing football)   Obesity    OSA (obstructive sleep apnea)    repeat study 2018 without significant OSA   Pneumonia    Post-cardiac injury syndrome (HCC)    History of cardiac injury from blunt trauma   Pulmonary hypertension (HCC)    a. moderately elevated PASP 07/2019.   PVCs (premature ventricular contractions)    Schizophrenia (HCC)    Goes to [facility]   Sleep apnea    Stroke [facility]) 2005   some left side weakness    Syncope    Recurrent, thought to be vasovagal. Also has h/o frequent PVCs.     Family History  Problem Relation Age of Onset   Heart disease Mother        MI   Heart failure Mother    Diabetes Mother        ALSO IN MOST OF HIS SIBLINGS; 2 UNLCES HAVE ALSO PASSED AWAY FROM DM   Cardiomyopathy Mother    Cancer - Ovarian Mother    Ovarian cancer Mother    Heart disease Father    Hypertension Father    Diabetes Father  Diabetes Sister    Diabetes Brother    Colon cancer Paternal Uncle    Colon cancer Paternal Uncle    Colon polyps Neg Hx    Esophageal cancer Neg Hx    Rectal cancer Neg Hx    Stomach cancer Neg Hx     Past Surgical History:  Procedure Laterality Date   AMPUTATION Right 01/25/2021   Procedure: RIGHT BELOW KNEE AMPUTATION;  Surgeon: Nadara Mustard, MD;  Location: [facility] OR;  Service: Orthopedics;  Laterality: Right;   CARDIAC CATHETERIZATION  12/19/10   DIFFUSE NONOBSTRUCTIVE CAD; NONISCHEMIC CARDIOMYOPATHY; LEFT VENTRICULAR ANGIOGRAM WAS PERFORMED SECONDARY TO  ELEVATED LEFT VENTRICULAR FILLING PRESSURES   COLONOSCOPY     ~ age [age]   COLONOSCOPY W/ POLYPECTOMY     I & D EXTREMITY Bilateral 10/24/2019   Procedure: IRRIGATION AND DEBRIDEMENT BILATERAL EXTREMITY WOUND ON FOOT;  Surgeon: Allena Napoleon, MD;  Location: [facility] OR;  Service: Plastics;  Laterality: Bilateral;   METATARSAL HEAD EXCISION Right 08/29/2018   Procedure: METATARSAL HEAD RESECTION;  Surgeon: Felecia Shelling, DPM;  Location: [facility] OR;  Service: Podiatry;  Laterality: Right;   METATARSAL OSTEOTOMY Right 08/29/2018   Procedure: SUB FIFTHE METATARSIA RIGHT FOOT;  Surgeon: Felecia Shelling, DPM;  Location: [facility] OR;  Service: Podiatry;  Laterality: Right;   MULTIPLE EXTRACTIONS WITH ALVEOLOPLASTY  01/27/2014   "all my teeth; 4 Quadrants of alveoloplasty   MULTIPLE EXTRACTIONS WITH ALVEOLOPLASTY N/A 01/27/2014   Procedure: EXTRACTION OF TEETH #'1, 2, 3, 4, 5, 6, 7, 8, 9, 10, 11, 12, 13, 14, 15, 16, 17, 20, 21, 22, 23,  24, 25, 26, 27, 28, 29, 31 and 32 WITH ALVEOLOPLASTY;  Surgeon: Charlynne Pander, DDS;  Location: [facility] OR;  Service: Oral Surgery;  Laterality: N/A;   ORIF FINGER / THUMB FRACTURE Right    POLYPECTOMY     RIGHT HEART CATH N/A 01/27/2021   Procedure: RIGHT HEART CATH;  Surgeon: Dolores Patty, MD;  [facility];  Service: Cardiovascular;  Laterality: N/A;   RIGHT/LEFT HEART CATH AND CORONARY ANGIOGRAPHY N/A 09/26/2017   Procedure: RIGHT/LEFT HEART CATH AND CORONARY ANGIOGRAPHY;  Surgeon: Dolores Patty, MD;  [facility];  Service: Cardiovascular;  Laterality: N/A;   SKIN SPLIT GRAFT Bilateral 10/24/2019   Procedure: SKIN GRAFT SPLIT THICKNESS LEFT THIGH;  Surgeon: Allena Napoleon, MD;  Location: [facility] OR;  Service: Plastics;  Laterality: Bilateral;   WOUND DEBRIDEMENT Right 08/29/2018   Procedure: Debridement of ulcer on right fifth metatarsal;  Surgeon: Felecia Shelling, DPM;  Location: [facility] OR;  Service: Podiatry;  Laterality: Right;   Social History   Occupational History   Occupation: Mortician  Tobacco Use   Smoking status: Never   Smokeless tobacco: Never  Vaping Use   Vaping Use: Never used  Substance and Sexual Activity   Alcohol use: No   Drug use: No   Sexual activity: Not on file

## 2021-02-23 ENCOUNTER — Telehealth: Payer: Self-pay

## 2021-02-23 NOTE — Telephone Encounter (Signed)
Liji with HHPT called requesting verbal orders for pt to have pt  Twice a week for three weeks and one time a week for four weeks.  Call back # [phone]

## 2021-02-24 ENCOUNTER — Encounter: Payer: Self-pay | Admitting: Family Medicine

## 2021-02-24 ENCOUNTER — Telehealth: Payer: Self-pay | Admitting: Orthopedic Surgery

## 2021-02-24 ENCOUNTER — Ambulatory Visit (INDEPENDENT_AMBULATORY_CARE_PROVIDER_SITE_OTHER): Payer: No Typology Code available for payment source | Admitting: Family Medicine

## 2021-02-24 ENCOUNTER — Other Ambulatory Visit: Payer: Self-pay

## 2021-02-24 VITALS — BP 140/68 | HR 83 | Temp 97.9°F | Ht 71.0 in

## 2021-02-24 DIAGNOSIS — Z794 Long term (current) use of insulin: Secondary | ICD-10-CM

## 2021-02-24 DIAGNOSIS — Z89511 Acquired absence of right leg below knee: Secondary | ICD-10-CM

## 2021-02-24 DIAGNOSIS — Z09 Encounter for follow-up examination after completed treatment for conditions other than malignant neoplasm: Secondary | ICD-10-CM | POA: Diagnosis not present

## 2021-02-24 DIAGNOSIS — E119 Type 2 diabetes mellitus without complications: Secondary | ICD-10-CM

## 2021-02-24 DIAGNOSIS — N179 Acute kidney failure, unspecified: Secondary | ICD-10-CM

## 2021-02-24 DIAGNOSIS — E782 Mixed hyperlipidemia: Secondary | ICD-10-CM

## 2021-02-24 DIAGNOSIS — I10 Essential (primary) hypertension: Secondary | ICD-10-CM | POA: Diagnosis not present

## 2021-02-24 DIAGNOSIS — I5022 Chronic systolic (congestive) heart failure: Secondary | ICD-10-CM

## 2021-02-24 LAB — BASIC METABOLIC PANEL
BUN: 35 mg/dL — ABNORMAL HIGH (ref 6–23)
CO2: 27 mEq/L (ref 19–32)
Calcium: 9.1 mg/dL (ref 8.4–10.5)
Chloride: 103 mEq/L (ref 96–112)
Creatinine, Ser: 1.3 mg/dL (ref 0.40–1.50)
GFR: 63.14 mL/min (ref >60.00)
Glucose, Bld: 228 mg/dL — ABNORMAL HIGH (ref 70–99)
Potassium: 3.9 mEq/L (ref 3.5–5.1)
Sodium: 139 mEq/L (ref 135–145)

## 2021-02-24 LAB — CBC
HCT: 34.4 % — ABNORMAL LOW (ref 39.0–52.0)
Hemoglobin: 11.3 g/dL — ABNORMAL LOW (ref 13.0–17.0)
MCHC: 32.7 g/dL (ref 30.0–36.0)
MCV: 91 fl (ref 78.0–100.0)
Platelets: 141 10*3/uL — ABNORMAL LOW (ref 150.0–400.0)
RBC: 3.78 Mil/uL — ABNORMAL LOW (ref 4.22–5.81)
RDW: 14.2 % (ref 11.5–15.5)
WBC: 8.5 10*3/uL (ref 4.0–10.5)

## 2021-02-24 NOTE — Telephone Encounter (Signed)
Received call from Lebron Conners with [facility] advised she has seen patient for eval only and will need to get orders for HHPT. Liji advised she had to change verbal order for start of care.   1 Wk 1, 2 Wk 3 and 1 Wk 4.  Liji also asked for verbal order for wound care nurse and condition management.  The number to contact Liji is

## 2021-02-24 NOTE — Telephone Encounter (Signed)
This has been done.

## 2021-02-24 NOTE — Telephone Encounter (Signed)
I called and sw HHPT to advise verbal ok for orders for therapy but does not need wound care at this time. He is using a shrinker and a limb protector and he has an appt on 03/07/2021 if that changes after appt will update at that time. To call with any other questions.

## 2021-02-24 NOTE — Patient Instructions (Addendum)
Please call [facility] and see if they are able to take you on as a new patient:  Address: [address], [place], [place] [address] Phone: [phone]  Please be sure to see your nephrologist/ kidney doctor as planned  I sent a message to Dr Gala Romney (heart doctor) for them to get you scheduled for follow-up  For your diabetes- ok to take your basaglar just once a day if you like; you can take 42 units in the am.  We may need to increase your dose a bit: Please check your fasting blood sugar in the morning As long as your fasting sugar is higher than 150 you can increase your insulin by 2 units every 2 days until we get your fasting sugar under 150  Please see me in 6 weeks to check on how you are doing We will contact [facility] and make sure they get you the home services that you need

## 2021-02-25 ENCOUNTER — Telehealth: Payer: Self-pay | Admitting: Family Medicine

## 2021-02-25 NOTE — Telephone Encounter (Signed)
Caller: [facility]) Call back Number: [phone]  Need verbal order to move OT & home health evaluation to next week

## 2021-02-28 ENCOUNTER — Telehealth: Payer: Self-pay

## 2021-02-28 ENCOUNTER — Encounter: Payer: Self-pay | Admitting: Orthopedic Surgery

## 2021-02-28 ENCOUNTER — Ambulatory Visit (INDEPENDENT_AMBULATORY_CARE_PROVIDER_SITE_OTHER): Payer: No Typology Code available for payment source | Admitting: Physician Assistant

## 2021-02-28 VITALS — Ht 71.0 in | Wt 266.0 lb

## 2021-02-28 DIAGNOSIS — Z89511 Acquired absence of right leg below knee: Secondary | ICD-10-CM

## 2021-02-28 MED ORDER — DOXYCYCLINE HYCLATE 100 MG PO TABS: 100.0000 mg | ORAL_TABLET | Freq: Two times a day (BID) | ORAL | 0 refills | Status: DC

## 2021-02-28 MED ORDER — OXYCODONE-ACETAMINOPHEN 7.5-325 MG PO TABS: 1.0000 | ORAL_TABLET | Freq: Three times a day (TID) | ORAL | 0 refills | Status: DC | PRN

## 2021-02-28 NOTE — Telephone Encounter (Signed)
Verbal orders given  

## 2021-02-28 NOTE — Progress Notes (Signed)
Office Visit Note   Patient: Mitchell [name]           Date of Birth: 1970/10/12           MRN: [id number] Visit Date: 02/28/2021              Requested by: Pearline Cables, MD [address] [place],  [place] [address] PCP: Pearline Cables, MD  Chief Complaint  Patient presents with   Right Leg - Follow-up    01/25/2021 Right BKA      HPI: Patient is a pleasant gentleman who is 5 weeks status post right below-knee amputation.  He was seen last week by Dr. Lajoyce Corners who recommended 2-week follow-up from removal of surgical staples.  He has had 1 fall on his stump that created some more swelling.  He comes in today because he noticed he had more bleeding last night he called our on-call physician who recommended elevating his amputation stump.  He said this did help with the bleeding.  He is also had a slight increase in pain.  Denies any fever or chills.  Assessment & Plan: Visit Diagnoses: No diagnosis found.  Plan: I will provide patient a prescription to renew his pain medication also a short course of doxycycline.  He knows he needs to be on a probiotic as well and I wrote this down for him.  I emphasized the importance of elevating his stump.  We will follow-up next week hopefully to remove the surgical staples  Follow-Up Instructions: No follow-ups on file.   Ortho Exam  Patient is alert, oriented, no adenopathy, well-dressed, normal affect, normal respiratory effort. Examination demonstrates surgical staples in place medial side he has 1 small area of superficial necrosis and dehiscence that is less than a centimeter.  Does not probe deeply.  No surrounding cellulitis no foul odor no purulent drainage.  Right now just has a drop of bloody drainage.  He does have a moderate amount of soft tissue swelling but no cellulitis rest of the surgical wound is well healed  Imaging: No results found. No images are attached to the encounter.  Labs: Lab Results   Component Value Date   HGBA1C 8.1 (H) 01/23/2021   HGBA1C 8.1 (H) 08/04/2020   HGBA1C 7.9 (A) 03/30/2020   ESRSEDRATE 81 (H) 10/21/2019   ESRSEDRATE 25 (H) 06/17/2018   ESRSEDRATE 29 (H) 05/28/2018   CRP 10.3 (H) 01/28/2021   CRP 12.8 (H) 01/27/2021   CRP 21.1 (H) 01/26/2021   REPTSTATUS 01/25/2021 FINAL 01/24/2021   GRAMSTAIN  08/29/2018    RARE WBC PRESENT, PREDOMINANTLY PMN NO ORGANISMS SEEN    CULT  01/24/2021    NO GROWTH Performed at [facility], [address], [place], [place] [address]    LABORGA KLEBSIELLA PNEUMONIAE 05/21/2018   LABORGA ENTEROBACTER SPECIES 05/21/2018   LABORGA STAPHYLOCOCCUS AUREUS 05/21/2018     Lab Results  Component Value Date   ALBUMIN 2.3 (L) 02/08/2021   ALBUMIN 2.2 (L) 02/05/2021   ALBUMIN 2.1 (L) 02/04/2021   PREALBUMIN 6.8 (L) 10/21/2019    Lab Results  Component Value Date   MG 1.8 02/16/2021   MG 1.9 02/14/2021   MG 1.9 02/11/2021   Lab Results  Component Value Date   VD25OH 11 (L) 08/16/2009    Lab Results  Component Value Date   PREALBUMIN 6.8 (L) 10/21/2019   CBC EXTENDED Latest Ref Rng & Units 02/24/2021 02/16/2021 02/08/2021  WBC 4.0 -  10.5 K/uL 8.5 6.7 8.6  RBC 4.22 - 5.81 Mil/uL 3.78(L) 3.46(L) 3.32(L)  HGB 13.0 - 17.0 g/dL 11.3(L) 10.2(L) 9.8(L)  HCT 39.0 - 52.0 % 34.4(L) 32.0(L) 31.0(L)  PLT 150.0 - 400.0 K/uL 141.0(L) 186 307  NEUTROABS 1.7 - 7.7 K/uL - - 6.4  LYMPHSABS 0.7 - 4.0 K/uL - - 1.4     Body mass index is 37.1 kg/m.  Orders:  No orders of the defined types were placed in this encounter.  Meds ordered this encounter  Medications   oxyCODONE-acetaminophen (PERCOCET) 7.5-325 MG tablet    Sig: Take 1 tablet by mouth every 8 (eight) hours as needed for severe pain.    Dispense:  21 tablet    Refill:  0   doxycycline (VIBRA-TABS) 100 MG tablet    Sig: Take 1 tablet (100 mg total) by mouth 2 (two) times daily.    Dispense:  30 tablet    Refill:  0     Procedures: No procedures  performed  Clinical Data: No additional findings.  ROS:  All other systems negative, except as noted in the HPI. Review of Systems  Objective: Vital Signs: Ht 5\' 11"  (1.803 m)   Wt 266 lb (120.7 kg)   BMI 37.10 kg/m   Specialty Comments:  No specialty comments available.  PMFS History: Patient Active Problem List   Diagnosis Date Noted   S/P below knee amputation, right (HCC) 02/07/2021   Cutaneous abscess of right foot    Diabetic foot infection (HCC)    SOB (shortness of breath)    Sepsis (HCC) 01/23/2021   Cellulitis 01/23/2021   Elevated troponin 01/23/2021   Acute metabolic encephalopathy 01/23/2021   AKI (acute kidney injury) (HCC) 01/23/2021   COVID-19 virus infection 01/23/2021   Wound infection 10/21/2019   Burn, foot, second degree, unspecified laterality, sequela 10/21/2019   QT prolongation 07/30/2019   Acute exacerbation of CHF (congestive heart failure) (HCC) 07/30/2019   Acute on chronic combined systolic and diastolic CHF (congestive heart failure) (HCC) 07/29/2019   Foot ulcer (HCC) 03/11/2018   Weakness 05/08/2017   Leukocytosis 05/08/2017   Stroke (HCC) 05/08/2017   Slurred speech 05/08/2017   Uncontrolled type 2 diabetes mellitus with hyperglycemia, with long-term current use of insulin (HCC) 12/07/2015   Noncompliance 01/22/2014   Chronic systolic heart failure (HCC) 10/02/2011   Erectile dysfunction 10/02/2011   Obstructive sleep apnea 10/11/2007   Diabetes mellitus with complication (HCC) 10/10/2007   HLD (hyperlipidemia) 10/10/2007   HYPOKALEMIA 10/10/2007   Obesity, Class III, BMI 40-49.9 (morbid obesity) (HCC) 10/10/2007   Essential hypertension 10/10/2007   PREMATURE VENTRICULAR CONTRACTIONS 10/10/2007   SYNCOPE 10/10/2007   COLONIC POLYPS, HX OF 10/10/2007   Past Medical History:  Diagnosis Date   Bipolar disorder (HCC)    CAD (coronary artery disease)    a. diffuse 3v CAD by cath 2019, medical therapy recommended.   Cataract     forming    Chronic systolic CHF (congestive heart failure) (HCC)    CVA (cerebral infarction)    No residual deficits   Depression    PTSD,    Diabetes mellitus    TYPE II; UNCONTROLLED BY HEMOGLOBIN A1c; STABLE AS  PER DISCHARGE   Headache(784.0)    Herpes simplex of male genitalia    History of colonic polyps    Hyperlipidemia    Hypertension    Myocardial infarction (HCC) 1987   (while playing football)   Obesity    OSA (obstructive sleep apnea)  repeat study 2018 without significant OSA   Pneumonia    Post-cardiac injury syndrome (HCC)    History of cardiac injury from blunt trauma   Pulmonary hypertension (HCC)    a. moderately elevated PASP 07/2019.   PVCs (premature ventricular contractions)    Schizophrenia (HCC)    Goes to [facility]   Sleep apnea    Stroke [facility]) 2005   some left side weakness   Syncope    Recurrent, thought to be vasovagal. Also has h/o frequent PVCs.     Family History  Problem Relation Age of Onset   Heart disease Mother        MI   Heart failure Mother    Diabetes Mother        ALSO IN MOST OF HIS SIBLINGS; 2 UNLCES HAVE ALSO PASSED AWAY FROM DM   Cardiomyopathy Mother    Cancer - Ovarian Mother    Ovarian cancer Mother    Heart disease Father    Hypertension Father    Diabetes Father    Diabetes Sister    Diabetes Brother    Colon cancer Paternal Uncle    Colon cancer Paternal Uncle    Colon polyps Neg Hx    Esophageal cancer Neg Hx    Rectal cancer Neg Hx    Stomach cancer Neg Hx     Past Surgical History:  Procedure Laterality Date   AMPUTATION Right 01/25/2021   Procedure: RIGHT BELOW KNEE AMPUTATION;  Surgeon: Nadara Mustard, MD;  Location: [facility] OR;  Service: Orthopedics;  Laterality: Right;   CARDIAC CATHETERIZATION  12/19/10   DIFFUSE NONOBSTRUCTIVE CAD; NONISCHEMIC CARDIOMYOPATHY; LEFT VENTRICULAR ANGIOGRAM WAS PERFORMED SECONDARY TO  ELEVATED LEFT VENTRICULAR FILLING PRESSURES   COLONOSCOPY     ~  age 47-23   COLONOSCOPY W/ POLYPECTOMY     I & D EXTREMITY Bilateral 10/24/2019   Procedure: IRRIGATION AND DEBRIDEMENT BILATERAL EXTREMITY WOUND ON FOOT;  Surgeon: Allena Napoleon, MD;  Location: [facility] OR;  Service: Plastics;  Laterality: Bilateral;   METATARSAL HEAD EXCISION Right 08/29/2018   Procedure: METATARSAL HEAD RESECTION;  Surgeon: Felecia Shelling, DPM;  [facility]: [facility] OR;  Service: Podiatry;  Laterality: Right;   METATARSAL OSTEOTOMY Right 08/29/2018   Procedure: SUB FIFTHE METATARSIA RIGHT FOOT;  Surgeon: Felecia Shelling, DPM;  [facility]: [facility] OR;  Service: Podiatry;  Laterality: Right;   MULTIPLE EXTRACTIONS WITH ALVEOLOPLASTY  01/27/2014   "all my teeth; 4 Quadrants of alveoloplasty   MULTIPLE EXTRACTIONS WITH ALVEOLOPLASTY N/A 01/27/2014   Procedure: EXTRACTION OF TEETH #'1, 2, 3, 4, 5, 6, 7, 8, 9, 10, 11, 12, 13, 14, 15, 16, 17, 20, 21, 22, 23, 24, 25, 26, 27, 28, 29, 31 and 32 WITH ALVEOLOPLASTY;  Surgeon: Charlynne Pander, DDS;  Location: [facility];  Service: Oral Surgery;  Laterality: N/A;   ORIF FINGER / THUMB FRACTURE Right    POLYPECTOMY     RIGHT HEART CATH N/A 01/27/2021   Procedure: RIGHT HEART CATH;  Surgeon: Dolores Patty, MD;  [facility];  Service: Cardiovascular;  Laterality: N/A;   RIGHT/LEFT HEART CATH AND CORONARY ANGIOGRAPHY N/A 09/26/2017   Procedure: RIGHT/LEFT HEART CATH AND CORONARY ANGIOGRAPHY;  Surgeon: Dolores Patty, MD;  [facility];  Service: Cardiovascular;  Laterality: N/A;   SKIN SPLIT GRAFT Bilateral 10/24/2019   Procedure: SKIN GRAFT SPLIT THICKNESS LEFT THIGH;  Surgeon: Allena Napoleon, MD;  Location: [facility] OR;  Service: Plastics;  Laterality: Bilateral;   WOUND DEBRIDEMENT Right 08/29/2018   Procedure: Debridement of ulcer on right fifth metatarsal;  Surgeon: Felecia Shelling, DPM;  Location: [facility] OR;  Service: Podiatry;  Laterality: Right;   Social History   Occupational History   Occupation: Mortician  Tobacco Use    Smoking status: Never   Smokeless tobacco: Never  Vaping Use   Vaping Use: Never used  Substance and Sexual Activity   Alcohol use: No   Drug use: No   Sexual activity: Not on file

## 2021-02-28 NOTE — Telephone Encounter (Signed)
Caller/Agency: [facility] Callback Number: [phone] Requesting OT/PT/Skilled Nursing/Social Work/Speech Therapy: Mitchell [name] is wanting VO to move OT evaluation to this week Frequency:

## 2021-03-02 ENCOUNTER — Telehealth: Payer: Self-pay

## 2021-03-02 NOTE — Telephone Encounter (Signed)
[place] for Oxycodone 7.5/325 sent for prior auth through cover my meds this morning. [employer] has approved medication. Case ID [id number]

## 2021-03-07 ENCOUNTER — Encounter: Payer: Self-pay | Admitting: Physician Assistant

## 2021-03-07 ENCOUNTER — Ambulatory Visit (INDEPENDENT_AMBULATORY_CARE_PROVIDER_SITE_OTHER): Payer: No Typology Code available for payment source | Admitting: Physician Assistant

## 2021-03-07 ENCOUNTER — Encounter: Payer: No Typology Code available for payment source | Admitting: Physician Assistant

## 2021-03-07 DIAGNOSIS — Z89511 Acquired absence of right leg below knee: Secondary | ICD-10-CM
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
## 2021-03-16 ENCOUNTER — Telehealth: Payer: Self-pay | Admitting: Family Medicine

## 2021-03-16 NOTE — Telephone Encounter (Signed)
Caller/Agency: Tawny Hopping  OT with Cindie Laroche Chip Boer Callback Number: [phone]  Requesting OT/PT/Skilled Nursing/Social  Work/Speech Therapy: OT Frequency: decrease freq. To 1 week  3 times  ( PT doing well )

## 2021-03-17 NOTE — Telephone Encounter (Signed)
Verbal orders given  

## 2021-03-21 ENCOUNTER — Ambulatory Visit (INDEPENDENT_AMBULATORY_CARE_PROVIDER_SITE_OTHER): Payer: No Typology Code available for payment source | Admitting: Physician Assistant

## 2021-03-21 DIAGNOSIS — Z89511 Acquired absence of right leg below knee: Secondary | ICD-10-CM

## 2021-03-21 MED ORDER — OXYCODONE-ACETAMINOPHEN 7.5-325 MG PO TABS: 1.0000 | ORAL_TABLET | Freq: Three times a day (TID) | ORAL | 0 refills | Status: DC | PRN

## 2021-03-21 MED ORDER — GABAPENTIN 100 MG PO CAPS: 100.0000 mg | ORAL_CAPSULE | Freq: Every day | ORAL | 0 refills | Status: DC
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
## 2021-03-24 ENCOUNTER — Other Ambulatory Visit: Payer: Self-pay | Admitting: Physical Medicine and Rehabilitation

## 2021-03-24 ENCOUNTER — Other Ambulatory Visit: Payer: Self-pay | Admitting: Physician Assistant
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
## 2021-04-07 ENCOUNTER — Telehealth: Payer: Self-pay | Admitting: Orthopedic Surgery

## 2021-04-07 ENCOUNTER — Ambulatory Visit: Payer: No Typology Code available for payment source | Admitting: Family Medicine

## 2021-04-07 DIAGNOSIS — Z89511 Acquired absence of right leg below knee: Secondary | ICD-10-CM

## 2021-04-07 DIAGNOSIS — I10 Essential (primary) hypertension: Secondary | ICD-10-CM

## 2021-04-07 DIAGNOSIS — E782 Mixed hyperlipidemia: Secondary | ICD-10-CM

## 2021-04-07 DIAGNOSIS — E119 Type 2 diabetes mellitus without complications: Secondary | ICD-10-CM

## 2021-04-07 DIAGNOSIS — N179 Acute kidney failure, unspecified: Secondary | ICD-10-CM

## 2021-04-07 DIAGNOSIS — Z1159 Encounter for screening for other viral diseases: Secondary | ICD-10-CM

## 2021-04-07 NOTE — Telephone Encounter (Signed)
Liji called. She is the PT working with patient in home. Woujld like verbal orders for 1x wk for 2 more wks starting 9/12. Her call back number is [phone]

## 2021-04-08 NOTE — Telephone Encounter (Signed)
I called and sw HHPT and advised ok for 1 wx 2 start date of 04/11/21

## 2021-04-11 ENCOUNTER — Telehealth: Payer: Self-pay | Admitting: Family Medicine

## 2021-04-11 ENCOUNTER — Other Ambulatory Visit: Payer: Self-pay

## 2021-04-11 ENCOUNTER — Other Ambulatory Visit (HOSPITAL_COMMUNITY): Payer: Self-pay

## 2021-04-11 ENCOUNTER — Emergency Department (HOSPITAL_COMMUNITY): Payer: No Typology Code available for payment source

## 2021-04-11 ENCOUNTER — Inpatient Hospital Stay (HOSPITAL_COMMUNITY)
Admission: EM | Admit: 2021-04-11 | Discharge: 2021-04-13 | DRG: 280 | Disposition: A | Discharge status: Home / Self Care | Payer: No Typology Code available for payment source | Attending: Family Medicine | Admitting: Family Medicine

## 2021-04-11 ENCOUNTER — Encounter (HOSPITAL_COMMUNITY): Payer: Self-pay | Admitting: Emergency Medicine

## 2021-04-11 DIAGNOSIS — Z89511 Acquired absence of right leg below knee: Secondary | ICD-10-CM

## 2021-04-11 DIAGNOSIS — Z20822 Contact with and (suspected) exposure to covid-19: Secondary | ICD-10-CM | POA: Diagnosis present

## 2021-04-11 DIAGNOSIS — Z79899 Other long term (current) drug therapy: Secondary | ICD-10-CM

## 2021-04-11 DIAGNOSIS — E1136 Type 2 diabetes mellitus with diabetic cataract: Secondary | ICD-10-CM | POA: Diagnosis present

## 2021-04-11 DIAGNOSIS — E785 Hyperlipidemia, unspecified: Secondary | ICD-10-CM | POA: Diagnosis present

## 2021-04-11 DIAGNOSIS — D638 Anemia in other chronic diseases classified elsewhere: Secondary | ICD-10-CM | POA: Diagnosis present

## 2021-04-11 DIAGNOSIS — E1142 Type 2 diabetes mellitus with diabetic polyneuropathy: Secondary | ICD-10-CM | POA: Diagnosis present

## 2021-04-11 DIAGNOSIS — Z8616 Personal history of COVID-19: Secondary | ICD-10-CM | POA: Diagnosis not present

## 2021-04-11 DIAGNOSIS — Z7982 Long term (current) use of aspirin: Secondary | ICD-10-CM

## 2021-04-11 DIAGNOSIS — Z794 Long term (current) use of insulin: Secondary | ICD-10-CM | POA: Diagnosis not present

## 2021-04-11 DIAGNOSIS — I5023 Acute on chronic systolic (congestive) heart failure: Secondary | ICD-10-CM | POA: Diagnosis present

## 2021-04-11 DIAGNOSIS — F319 Bipolar disorder, unspecified: Secondary | ICD-10-CM | POA: Diagnosis present

## 2021-04-11 DIAGNOSIS — K219 Gastro-esophageal reflux disease without esophagitis: Secondary | ICD-10-CM | POA: Diagnosis present

## 2021-04-11 DIAGNOSIS — E1165 Type 2 diabetes mellitus with hyperglycemia: Secondary | ICD-10-CM | POA: Diagnosis not present

## 2021-04-11 DIAGNOSIS — Z8249 Family history of ischemic heart disease and other diseases of the circulatory system: Secondary | ICD-10-CM

## 2021-04-11 DIAGNOSIS — Z9114 Patient's other noncompliance with medication regimen: Secondary | ICD-10-CM

## 2021-04-11 DIAGNOSIS — Z886 Allergy status to analgesic agent status: Secondary | ICD-10-CM

## 2021-04-11 DIAGNOSIS — E1151 Type 2 diabetes mellitus with diabetic peripheral angiopathy without gangrene: Secondary | ICD-10-CM | POA: Diagnosis present

## 2021-04-11 DIAGNOSIS — E876 Hypokalemia: Secondary | ICD-10-CM | POA: Diagnosis present

## 2021-04-11 DIAGNOSIS — Z833 Family history of diabetes mellitus: Secondary | ICD-10-CM | POA: Diagnosis not present

## 2021-04-11 DIAGNOSIS — R778 Other specified abnormalities of plasma proteins: Secondary | ICD-10-CM | POA: Diagnosis not present

## 2021-04-11 DIAGNOSIS — J9601 Acute respiratory failure with hypoxia: Secondary | ICD-10-CM | POA: Diagnosis present

## 2021-04-11 DIAGNOSIS — I272 Pulmonary hypertension, unspecified: Secondary | ICD-10-CM | POA: Diagnosis present

## 2021-04-11 DIAGNOSIS — J810 Acute pulmonary edema: Secondary | ICD-10-CM

## 2021-04-11 DIAGNOSIS — D72829 Elevated white blood cell count, unspecified: Secondary | ICD-10-CM | POA: Diagnosis present

## 2021-04-11 DIAGNOSIS — Z8673 Personal history of transient ischemic attack (TIA), and cerebral infarction without residual deficits: Secondary | ICD-10-CM

## 2021-04-11 DIAGNOSIS — I252 Old myocardial infarction: Secondary | ICD-10-CM

## 2021-04-11 DIAGNOSIS — E66813 Obesity, class 3: Secondary | ICD-10-CM | POA: Diagnosis present

## 2021-04-11 DIAGNOSIS — I255 Ischemic cardiomyopathy: Secondary | ICD-10-CM | POA: Diagnosis present

## 2021-04-11 DIAGNOSIS — Z6838 Body mass index (BMI) 38.0-38.9, adult: Secondary | ICD-10-CM | POA: Diagnosis not present

## 2021-04-11 DIAGNOSIS — Z9119 Patient's noncompliance with other medical treatment and regimen: Secondary | ICD-10-CM

## 2021-04-11 DIAGNOSIS — I214 Non-ST elevation (NSTEMI) myocardial infarction: Principal | ICD-10-CM | POA: Diagnosis present

## 2021-04-11 DIAGNOSIS — I110 Hypertensive heart disease with heart failure: Secondary | ICD-10-CM | POA: Diagnosis present

## 2021-04-11 DIAGNOSIS — Z6841 Body Mass Index (BMI) 40.0 and over, adult: Secondary | ICD-10-CM

## 2021-04-11 DIAGNOSIS — I2510 Atherosclerotic heart disease of native coronary artery without angina pectoris: Secondary | ICD-10-CM | POA: Diagnosis present

## 2021-04-11 LAB — CBC WITH DIFFERENTIAL/PLATELET
Abs Immature Granulocytes: 0.03 10*3/uL (ref 0.00–0.07)
Basophils Absolute: 0 10*3/uL (ref 0.0–0.1)
Basophils Relative: 0 %
Eosinophils Absolute: 0.1 10*3/uL (ref 0.0–0.5)
Eosinophils Relative: 1 %
HCT: 35.7 % — ABNORMAL LOW (ref 39.0–52.0)
Hemoglobin: 11.5 g/dL — ABNORMAL LOW (ref 13.0–17.0)
Immature Granulocytes: 0 %
Lymphocytes Relative: 8 %
Lymphs Abs: 0.9 10*3/uL (ref 0.7–4.0)
MCH: 29.7 pg (ref 26.0–34.0)
MCHC: 32.2 g/dL (ref 30.0–36.0)
MCV: 92.2 fL (ref 80.0–100.0)
Monocytes Absolute: 0.6 10*3/uL (ref 0.1–1.0)
Monocytes Relative: 5 %
Neutro Abs: 9.2 10*3/uL — ABNORMAL HIGH (ref 1.7–7.7)
Neutrophils Relative %: 86 %
Platelets: 186 10*3/uL (ref 150–400)
RBC: 3.87 MIL/uL — ABNORMAL LOW (ref 4.22–5.81)
RDW: 13.1 % (ref 11.5–15.5)
WBC: 10.8 10*3/uL — ABNORMAL HIGH (ref 4.0–10.5)
nRBC: 0 % (ref 0.0–0.2)

## 2021-04-11 LAB — COMPREHENSIVE METABOLIC PANEL
ALT: 11 U/L (ref 0–44)
AST: 20 U/L (ref 15–41)
Albumin: 2.9 g/dL — ABNORMAL LOW (ref 3.5–5.0)
Alkaline Phosphatase: 69 U/L (ref 38–126)
Anion gap: 11 (ref 5–15)
BUN: 13 mg/dL (ref 6–20)
CO2: 26 mmol/L (ref 22–32)
Calcium: 8.6 mg/dL — ABNORMAL LOW (ref 8.9–10.3)
Chloride: 103 mmol/L (ref 98–111)
Creatinine, Ser: 1.35 mg/dL — ABNORMAL HIGH (ref 0.61–1.24)
GFR, Estimated: >60 mL/min (ref >60)
Glucose, Bld: 276 mg/dL — ABNORMAL HIGH (ref 70–99)
Potassium: 2.8 mmol/L — ABNORMAL LOW (ref 3.5–5.1)
Sodium: 140 mmol/L (ref 135–145)
Total Bilirubin: 0.9 mg/dL (ref 0.3–1.2)
Total Protein: 6.3 g/dL — ABNORMAL LOW (ref 6.5–8.1)

## 2021-04-11 LAB — PROTIME-INR
INR: 1.1 (ref 0.8–1.2)
Prothrombin Time: 14.1 seconds (ref 11.4–15.2)

## 2021-04-11 LAB — CBG MONITORING, ED
Glucose-Capillary: 193 mg/dL — ABNORMAL HIGH (ref 70–99)
Glucose-Capillary: 183 mg/dL — ABNORMAL HIGH (ref 70–99)

## 2021-04-11 LAB — GLUCOSE, CAPILLARY
Glucose-Capillary: 155 mg/dL — ABNORMAL HIGH (ref 70–99)
Glucose-Capillary: 191 mg/dL — ABNORMAL HIGH (ref 70–99)

## 2021-04-11 LAB — BASIC METABOLIC PANEL
Anion gap: 9 (ref 5–15)
BUN: 12 mg/dL (ref 6–20)
CO2: 28 mmol/L (ref 22–32)
Calcium: 8.5 mg/dL — ABNORMAL LOW (ref 8.9–10.3)
Chloride: 104 mmol/L (ref 98–111)
Creatinine, Ser: 1.2 mg/dL (ref 0.61–1.24)
GFR, Estimated: >60 mL/min (ref >60)
Glucose, Bld: 187 mg/dL — ABNORMAL HIGH (ref 70–99)
Potassium: 3.6 mmol/L (ref 3.5–5.1)
Sodium: 141 mmol/L (ref 135–145)

## 2021-04-11 LAB — HEMOGLOBIN A1C
Hgb A1c MFr Bld: 7.8 % — ABNORMAL HIGH (ref 4.8–5.6)
Mean Plasma Glucose: 177.16 mg/dL

## 2021-04-11 LAB — TROPONIN I (HIGH SENSITIVITY)
Troponin I (High Sensitivity): 70 ng/L — ABNORMAL HIGH (ref <18)
Troponin I (High Sensitivity): 529 ng/L (ref <18)
Troponin I (High Sensitivity): 8567 ng/L (ref <18)
Troponin I (High Sensitivity): 11525 ng/L (ref <18)

## 2021-04-11 LAB — RESP PANEL BY RT-PCR (FLU A&B, COVID) ARPGX2
Influenza A by PCR: NEGATIVE
Influenza B by PCR: NEGATIVE
SARS Coronavirus 2 by RT PCR: NEGATIVE

## 2021-04-11 LAB — HEPARIN LEVEL (UNFRACTIONATED): Heparin Unfractionated: 0.18 IU/mL — ABNORMAL LOW (ref 0.30–0.70)

## 2021-04-11 LAB — APTT: aPTT: 29 seconds (ref 24–36)

## 2021-04-11 LAB — BRAIN NATRIURETIC PEPTIDE: B Natriuretic Peptide: 612.1 pg/mL — ABNORMAL HIGH (ref 0.0–100.0)

## 2021-04-11 MED ORDER — FUROSEMIDE 10 MG/ML IJ SOLN
40.0000 mg | Freq: Two times a day (BID) | INTRAMUSCULAR | Status: DC
  Administered 2021-04-11: 40 mg via INTRAVENOUS
  Filled 2021-04-11: qty 4

## 2021-04-11 MED ORDER — ROSUVASTATIN CALCIUM 20 MG PO TABS
20.0000 mg | ORAL_TABLET | Freq: Every day | ORAL | Status: DC
  Administered 2021-04-11 – 2021-04-13 (×3): 20 mg via ORAL
  Filled 2021-04-11 (×3): qty 1

## 2021-04-11 MED ORDER — FUROSEMIDE 10 MG/ML IJ SOLN
80.0000 mg | Freq: Two times a day (BID) | INTRAMUSCULAR | Status: DC
  Administered 2021-04-11 – 2021-04-12 (×2): 80 mg via INTRAVENOUS
  Filled 2021-04-11 (×2): qty 8

## 2021-04-11 MED ORDER — MAGNESIUM SULFATE 2 GM/50ML IV SOLN
2.0000 g | Freq: Once | INTRAVENOUS | Status: AC
  Administered 2021-04-11: 2 g via INTRAVENOUS
  Filled 2021-04-11: qty 50

## 2021-04-11 MED ORDER — HEPARIN BOLUS VIA INFUSION
4000.0000 [IU] | Freq: Once | INTRAVENOUS | Status: AC
  Administered 2021-04-11: 4000 [IU] via INTRAVENOUS
  Filled 2021-04-11: qty 4000

## 2021-04-11 MED ORDER — POTASSIUM CHLORIDE 20 MEQ PO PACK
40.0000 meq | PACK | Freq: Once | ORAL | Status: AC
  Administered 2021-04-11: 40 meq via ORAL
  Filled 2021-04-11: qty 2

## 2021-04-11 MED ORDER — SACUBITRIL-VALSARTAN 49-51 MG PO TABS
1.0000 | ORAL_TABLET | Freq: Two times a day (BID) | ORAL | Status: DC
  Administered 2021-04-11 – 2021-04-13 (×5): 1 via ORAL
  Filled 2021-04-11 (×6): qty 1

## 2021-04-11 MED ORDER — ZOLPIDEM TARTRATE 5 MG PO TABS
5.0000 mg | ORAL_TABLET | Freq: Every evening | ORAL | Status: DC | PRN
  Administered 2021-04-11 – 2021-04-12 (×2): 5 mg via ORAL
  Filled 2021-04-11 (×2): qty 1

## 2021-04-11 MED ORDER — ASPIRIN EC 81 MG PO TBEC
81.0000 mg | DELAYED_RELEASE_TABLET | Freq: Every day | ORAL | Status: DC
  Administered 2021-04-12 – 2021-04-13 (×2): 81 mg via ORAL
  Filled 2021-04-11 (×3): qty 1

## 2021-04-11 MED ORDER — SODIUM CHLORIDE 0.9% FLUSH: 3.0000 mL | INTRAVENOUS | Status: DC | PRN

## 2021-04-11 MED ORDER — PANTOPRAZOLE SODIUM 40 MG PO TBEC
40.0000 mg | DELAYED_RELEASE_TABLET | Freq: Every day | ORAL | Status: DC
  Administered 2021-04-11 – 2021-04-13 (×3): 40 mg via ORAL
  Filled 2021-04-11 (×3): qty 1

## 2021-04-11 MED ORDER — ASPIRIN 81 MG PO CHEW: 81.0000 mg | CHEWABLE_TABLET | Freq: Every day | ORAL | Status: DC

## 2021-04-11 MED ORDER — SODIUM CHLORIDE 0.9% FLUSH
3.0000 mL | Freq: Two times a day (BID) | INTRAVENOUS | Status: DC
  Administered 2021-04-12 – 2021-04-13 (×3): 3 mL via INTRAVENOUS

## 2021-04-11 MED ORDER — TORSEMIDE 20 MG PO TABS
20.0000 mg | ORAL_TABLET | Freq: Every day | ORAL | Status: DC
  Administered 2021-04-11: 20 mg via ORAL
  Filled 2021-04-11: qty 1

## 2021-04-11 MED ORDER — INSULIN ASPART 100 UNIT/ML IJ SOLN
0.0000 [IU] | Freq: Three times a day (TID) | INTRAMUSCULAR | Status: DC
  Administered 2021-04-11 (×2): 2 [IU] via SUBCUTANEOUS
  Administered 2021-04-12: 1 [IU] via SUBCUTANEOUS
  Administered 2021-04-13 (×2): 2 [IU] via SUBCUTANEOUS

## 2021-04-11 MED ORDER — HEPARIN BOLUS VIA INFUSION
2000.0000 [IU] | Freq: Once | INTRAVENOUS | Status: AC
  Administered 2021-04-12: 2000 [IU] via INTRAVENOUS
  Filled 2021-04-11: qty 2000

## 2021-04-11 MED ORDER — SODIUM CHLORIDE 0.9% FLUSH
3.0000 mL | Freq: Two times a day (BID) | INTRAVENOUS | Status: DC
  Administered 2021-04-12: 3 mL via INTRAVENOUS

## 2021-04-11 MED ORDER — INSULIN GLARGINE-YFGN 100 UNIT/ML ~~LOC~~ SOLN
20.0000 [IU] | Freq: Two times a day (BID) | SUBCUTANEOUS | Status: DC
  Administered 2021-04-11 – 2021-04-13 (×5): 20 [IU] via SUBCUTANEOUS
  Filled 2021-04-11 (×7): qty 0.2

## 2021-04-11 MED ORDER — POTASSIUM CHLORIDE CRYS ER 20 MEQ PO TBCR
60.0000 meq | EXTENDED_RELEASE_TABLET | ORAL | Status: AC
  Administered 2021-04-11: 60 meq via ORAL
  Filled 2021-04-11: qty 3

## 2021-04-11 MED ORDER — SPIRONOLACTONE 25 MG PO TABS
25.0000 mg | ORAL_TABLET | Freq: Every day | ORAL | Status: DC
  Administered 2021-04-11 – 2021-04-13 (×3): 25 mg via ORAL
  Filled 2021-04-11 (×3): qty 1

## 2021-04-11 MED ORDER — SODIUM CHLORIDE 0.9 % IV SOLN: INTRAVENOUS | Status: DC

## 2021-04-11 MED ORDER — HYDRALAZINE HCL 20 MG/ML IJ SOLN
10.0000 mg | INTRAMUSCULAR | Status: DC | PRN
  Administered 2021-04-11: 10 mg via INTRAVENOUS
  Filled 2021-04-11: qty 1

## 2021-04-11 MED ORDER — HEPARIN (PORCINE) 25000 UT/250ML-% IV SOLN
1900.0000 [IU]/h | INTRAVENOUS | Status: DC
  Administered 2021-04-11: 1400 [IU]/h via INTRAVENOUS
  Administered 2021-04-12: 1700 [IU]/h via INTRAVENOUS
  Filled 2021-04-11 (×2): qty 250

## 2021-04-11 MED ORDER — GABAPENTIN 100 MG PO CAPS
100.0000 mg | ORAL_CAPSULE | Freq: Every day | ORAL | Status: DC
  Administered 2021-04-11 – 2021-04-12 (×2): 100 mg via ORAL
  Filled 2021-04-11 (×2): qty 1

## 2021-04-11 MED ORDER — POTASSIUM CHLORIDE 10 MEQ/100ML IV SOLN
10.0000 meq | INTRAVENOUS | Status: AC
  Administered 2021-04-11 (×2): 10 meq via INTRAVENOUS
  Filled 2021-04-11 (×2): qty 100

## 2021-04-11 MED ORDER — CARVEDILOL 6.25 MG PO TABS
6.2500 mg | ORAL_TABLET | Freq: Two times a day (BID) | ORAL | Status: DC
  Administered 2021-04-11 – 2021-04-13 (×4): 6.25 mg via ORAL
  Filled 2021-04-11 (×4): qty 1

## 2021-04-11 MED ORDER — ENOXAPARIN SODIUM 40 MG/0.4ML IJ SOSY
40.0000 mg | PREFILLED_SYRINGE | INTRAMUSCULAR | Status: DC
  Administered 2021-04-11: 40 mg via SUBCUTANEOUS
  Filled 2021-04-11: qty 0.4

## 2021-04-11 MED ORDER — ACETAMINOPHEN 325 MG PO TABS: 650.0000 mg | ORAL_TABLET | ORAL | Status: DC | PRN

## 2021-04-11 MED ORDER — SODIUM CHLORIDE 0.9 % IV SOLN: 250.0000 mL | INTRAVENOUS | Status: DC | PRN

## 2021-04-11 NOTE — Consult Note (Addendum)
Advanced Heart Failure Team Consult Note   Primary Physician: [name], MD PCP-Cardiologist:  Glori Bickers, MD  Reason for Consultation: Acute on chronic HFrEF  HPI:    Mitchell [name] is seen today for evaluation of acute on chronic HFrEF at the request of Dr. Fuller Plan.   The patient is a 50 y/o [place] American male with a medical history significant for HFrEF, ischemic cardiomyopathy, CAD, hypertension, hyperlipidemia, type 2 diabetes, PVD s/p right BKA, COVID-19 in June 2022 and prior CVA who presented to the [facility] Ed via EMS for evaluation of shortness of breath and chest pain.  He says that he awoke this morning acutely short of breath and after arising, he began sweating profusely.  He was able to get to his wheelchair and call out to family members for assistance.  He also experienced midsternal chest pressure and shortly afterward, EMS was called.  On arrival, his oxygen saturation was 86%.  Over the past few nights, he acknowledges orthopnea and abdominal distention and  discovered today that he did not take his torsemide over the past several days.  Due to poor vision, he did not immediately realize he ran out of torsemide and thought he placed a pill in each of his daily medication compartments.     In the ED, his BNP was elevated to 612.1 and hsTs were 70 and 529.  A CXR was notable for pulmonary edema and cardiomegaly.  He received a dose of IV Lasix and reports symptom relief afterward.   His last hospitalization was in June for sepsis secondary to an infected diabetic foot wound s/p right BKA on 01/25/2021.  During that admission, he was also notably volume overloaded and underwent diuresis as well as a right heart catheterization, which found normal filling pressures, normal CO and mild PAH.    On exam, he is sitting up in the stretcher in no acute distress on supplemental oxygen.  He denies chest pain, shortness of breath, nausea and abdominal pain at this  time.   Echo on 01/24/2021: IMPRESSIONS    1. Left ventricular ejection fraction, by estimation, is 20 to 25%. The  left ventricle has severely decreased function. The left ventricle  demonstrates global hypokinesis. There is mild concentric left ventricular  hypertrophy. Indeterminate diastolic  filling due to E-A fusion.      No evidence of LV thrombus.   2. The mitral valve is grossly normal. Trivial mitral valve  regurgitation.   3. The aortic valve was not well visualized. Aortic valve regurgitation  is not visualized.   4. Tricuspid regurgitation signal is inadequate for assessing PA  pressure.   5. The inferior vena cava is dilated in size with <50% respiratory  variability, suggesting right atrial pressure of 15 mmHg.   Comparison(s): A prior study was performed on 07/30/2019. Prior images  reviewed side by side. EF is still significantly depressed.   FINDINGS   Left Ventricle: No evidence of LV thrombus. Left ventricular ejection  fraction, by estimation, is 20 to 25%. The left ventricle has severely  decreased function. The left ventricle demonstrates global hypokinesis.  Definity contrast agent was given IV to  delineate the left ventricular endocardial borders. There is mild  concentric left ventricular hypertrophy. Indeterminate diastolic filling  due to E-A fusion.   Right Ventricle: Tricuspid regurgitation signal is inadequate for  assessing PA pressure.   Pericardium: There is no evidence of pericardial effusion.   Mitral Valve: The mitral valve is  grossly normal. Trivial mitral valve  regurgitation.   Tricuspid Valve: The tricuspid valve is not well visualized. Tricuspid  valve regurgitation is not demonstrated.   Aortic Valve: The aortic valve was not well visualized. Aortic valve  regurgitation is not visualized.   Pulmonic Valve: The pulmonic valve was not well visualized.   Aorta: The aortic root and ascending aorta are structurally normal, with   no evidence of dilitation for age and BSA.   Venous: The inferior vena cava is dilated in size with less than 50%  respiratory variability, suggesting right atrial pressure of 15 mmHg.   LEFT VENTRICLE  PLAX 2D  LVIDd:         6.80 cm  LVIDs:         5.90 cm  LV PW:         1.30 cm  LV IVS:        1.30 cm  LVOT diam:     2.20 cm  LVOT Area:     3.80 cm      IVC  IVC diam: 2.90 cm   LEFT ATRIUM         Index  LA diam:    4.90 cm 1.94 cm/m      AORTA  Ao Root diam: 3.80 cm  Ao Asc diam:  3.80 cm   MITRAL VALVE  MV Area (PHT): 5.54 cm    SHUNTS  MV Decel Time: 137 msec    Systemic Diam: 2.20 cm  MV E velocity: 83.60 cm/s  MV A velocity: 52.30 cm/s  MV E/A ratio:  1.60   Right heart catheterization, 01/27/2021 Findings:   RA = 5 RV = 44/7 PA = 43/19 (29) PCW = 10 Fick cardiac output/index = 8.5/3.3 PVR = 2.5 WU Ao sat = 97% PA sat = 68%, 70%   Assessment: 1. Normal filling pressures 2. Mild PAH 3. Normal cardiac output  Left heart catheterization, 2019 Dist RCA lesion is 70% stenosed. Ost RPDA to RPDA lesion is 85% stenosed. Ost 1st Mrg lesion is 95% stenosed. 1st Mrg lesion is 95% stenosed. Ost 2nd Mrg to 2nd Mrg lesion is 95% stenosed. Prox LAD to Mid LAD lesion is 80% stenosed. Dist LAD lesion is 95% stenosed.   Findings:   RA = 8 RV = 43/10 PA = 43/16 (26) PCW = 11 Fick cardiac output/index = 6.4/2.6 PVR = 2.3 WU Ao sat = 91% PA sat = 64%, 65%    Review of Systems: [y] = yes, _0  = no   General: Weight gain _1 ; Weight loss _2 ; Anorexia _3 ; Fatigue _4 ; Fever _5 ; Chills _6 ; Weakness _7   Cardiac: Chest pain/pressure [x ]; Resting SOB [ x]; Exertional SOB _8 ; Orthopnea _9 ; Pedal Edema [ x]; Palpitations _10 ; Syncope _11 ; Presyncope _12 ; Paroxysmal nocturnal dyspnea_13   Pulmonary: Cough _14 ; Wheezing_15 ; Hemoptysis_16 ; Sputum _17 ; Snoring _18   GI: Vomiting_19 ; Dysphagia_20 ; Melena_21 ; Hematochezia _22 ; Heartburn_23 ; Abdominal pain [  ]; Constipation _24 ; Diarrhea _25 ; BRBPR _26   GU: Hematuria_27 ; Dysuria _28 ; Nocturia_29   Vascular: Pain in legs with walking _30 ; Pain in feet with lying flat _31 ; Non-healing sores _32 ; Stroke _33 ; TIA _34 ; Slurred speech _35 ;  Neuro: Headaches_36 ; Vertigo_37 ; Seizures_38 ; Paresthesias_39 ;Blurred vision _40 ; Diplopia _41 ; Vision changes _42   Ortho/Skin: Arthritis _43 ;  Joint pain _0 ; Muscle pain _1 ; Joint swelling _2 ; Back Pain _3 ; Rash _4   Psych: Depression_5 ; Anxiety_6   Heme: Bleeding problems _7 ; Clotting disorders _8 ; Anemia _9   Endocrine: Diabetes _10 ; Thyroid dysfunction_11   Home Medications Prior to Admission medications   Medication Sig Start Date End Date Taking? Authorizing Provider  torsemide (DEMADEX) 10 MG tablet Take 1 tablet (10 mg total) by mouth daily. 02/17/21  Yes Love, Ivan Anchors, PA-C  acetaminophen (TYLENOL) 325 MG tablet Take 1-2 tablets (325-650 mg total) by mouth every 8 (eight) hours. Patient not taking: No sig reported 02/17/21   Love, Ivan Anchors, PA-C  ascorbic acid (VITAMIN C) 250 MG tablet Take 1 tablet (250 mg total) by mouth daily. 02/18/21   Bary Leriche, PA-C  aspirin EC 81 MG EC tablet Take 1 tablet (81 mg total) by mouth daily. Patient not taking: No sig reported 11/03/19   Domenic Polite, MD  Blood Glucose Monitoring Suppl (ACCU-CHEK GUIDE ME) w/Device KIT 1 each by Does not apply route 2 (two) times daily. Use to monitor glucose levels BID 06/07/18   Renato Shin, MD  carvedilol (COREG) 6.25 MG tablet Take 1 tablet (6.25 mg total) by mouth 2 (two) times daily with a meal. 02/17/21   Love, Ivan Anchors, PA-C  docusate sodium (COLACE) 100 MG capsule Take 1 capsule (100 mg total) by mouth 3 (three) times daily. Patient not taking: No sig reported 02/17/21   Love, Ivan Anchors, PA-C  doxycycline (VIBRA-TABS) 100 MG tablet Take 1 tablet by mouth twice daily Patient taking differently: Take 100 mg by mouth 2 (two) times daily. 03/24/21   Persons, Bevely Palmer, PA   gabapentin (NEURONTIN) 100 MG capsule Take 1 capsule (100 mg total) by mouth at bedtime. 03/21/21   Persons, Bevely Palmer, PA  glucose blood (ACCU-CHEK GUIDE) test strip 1 each by Other route in the morning and at bedtime. And lancets 2/day. 11/13/19   Renato Shin, MD  hydrocerin (EUCERIN) CREA Apply 1 application topically 2 (two) times daily. To left foot/leg 02/17/21   Love, Ivan Anchors, PA-C  insulin glargine (LANTUS) 100 UNIT/ML injection Inject 0.21 mLs (21 Units total) into the skin 2 (two) times daily. THIS IS THE SAME AS BASIGLAR--THE INSULIN IS DIVIDED INTO TWICE A DAY FOR BETTER COVERAGE. 02/17/21   Love, Ivan Anchors, PA-C  Lancets 28G MISC Use for glucose testing up to BID 11/02/16   [name], [name], MD  Lancets Misc. (ACCU-CHEK FASTCLIX LANCET) KIT Use to monitor glucose levels BID 06/07/18   Renato Shin, MD  methocarbamol (ROBAXIN) 500 MG tablet Take 1 tablet (500 mg total) by mouth every 6 (six) hours as needed for muscle spasms. 02/17/21   Love, Ivan Anchors, PA-C  Multiple Vitamin (MULTIVITAMIN WITH MINERALS) TABS tablet Take 1 tablet by mouth daily. 02/04/21   Thurnell Lose, MD  nutrition supplement, JUVEN, (JUVEN) PACK Take 1 packet by mouth 2 (two) times daily between meals. 02/17/21   Love, Ivan Anchors, PA-C  oxyCODONE-acetaminophen (PERCOCET) 7.5-325 MG tablet Take 1 tablet by mouth every 8 (eight) hours as needed for severe pain. 03/21/21   Persons, Bevely Palmer, PA  pantoprazole (PROTONIX) 40 MG tablet Take 1 tablet (40 mg total) by mouth daily. 02/17/21   Love, Ivan Anchors, PA-C  polyethylene glycol (MIRALAX / GLYCOLAX) 17 g packet Take 17 g by mouth daily. 02/18/21   Love, Ivan Anchors, PA-C  rosuvastatin (CRESTOR) 20 MG tablet Take  1 tablet (20 mg total) by mouth daily. 02/03/21   Thurnell Lose, MD  senna (SENOKOT) 8.6 MG TABS tablet Take 2 tablets (17.2 mg total) by mouth at bedtime. 02/17/21   Love, Ivan Anchors, PA-C  simethicone (MYLICON) 80 MG chewable tablet Chew 1 tablet (80 mg total) by mouth 4  (four) times daily. 02/17/21   Bary Leriche, PA-C    Past Medical History: Past Medical History:  Diagnosis Date   Bipolar disorder ([place])    CAD (coronary artery disease)    a. diffuse 3v CAD by cath 2019, medical therapy recommended.   Cataract    forming    Chronic systolic CHF (congestive heart failure) (HCC)    CVA (cerebral infarction)    No residual deficits   Depression    PTSD,    Diabetes mellitus    TYPE II; UNCONTROLLED BY HEMOGLOBIN A1c; STABLE AS  PER DISCHARGE   Headache(784.0)    Herpes simplex of male genitalia    History of colonic polyps    Hyperlipidemia    Hypertension    Myocardial infarction ([place]) 1987   (while playing football)   Obesity    OSA (obstructive sleep apnea)    repeat study 2018 without significant OSA   Pneumonia    Post-cardiac injury syndrome ([place])    History of cardiac injury from blunt trauma   Pulmonary hypertension ([place])    a. moderately elevated PASP 07/2019.   PVCs (premature ventricular contractions)    Schizophrenia ([place])    Goes to [facility]   Sleep apnea    Stroke [facility]) 2005   some left side weakness   Syncope    Recurrent, thought to be vasovagal. Also has h/o frequent PVCs.     Past Surgical History: Past Surgical History:  Procedure Laterality Date   AMPUTATION Right 01/25/2021   Procedure: RIGHT BELOW KNEE AMPUTATION;  Surgeon: [name], MD;  Location: [place];  Service: Orthopedics;  Laterality: Right;   CARDIAC CATHETERIZATION  12/19/10   DIFFUSE NONOBSTRUCTIVE CAD; NONISCHEMIC CARDIOMYOPATHY; LEFT VENTRICULAR ANGIOGRAM WAS PERFORMED SECONDARY TO  ELEVATED LEFT VENTRICULAR FILLING PRESSURES   COLONOSCOPY     ~ age [age]   COLONOSCOPY W/ POLYPECTOMY     I & D EXTREMITY Bilateral 10/24/2019   Procedure: IRRIGATION AND DEBRIDEMENT BILATERAL EXTREMITY WOUND ON FOOT;  Surgeon: Cindra Presume, MD;  Location: [place];  Service: Plastics;  Laterality: Bilateral;   METATARSAL HEAD EXCISION Right  08/29/2018   Procedure: METATARSAL HEAD RESECTION;  Surgeon: Edrick Kins, DPM;  Location: [place];  Service: Podiatry;  Laterality: Right;   METATARSAL OSTEOTOMY Right 08/29/2018   Procedure: SUB FIFTHE METATARSIA RIGHT FOOT;  Surgeon: Edrick Kins, DPM;  Location: [place];  Service: Podiatry;  Laterality: Right;   MULTIPLE EXTRACTIONS WITH ALVEOLOPLASTY  01/27/2014   "all my teeth; 4 Quadrants of alveoloplasty   MULTIPLE EXTRACTIONS WITH ALVEOLOPLASTY N/A 01/27/2014   Procedure: EXTRACTION OF TEETH #'1, 2, 3, 4, 5, 6, 7, 8, 9, 10, 11, 12, 13, 14, 15, 16, 17, 20, 21, 22, 23, 24, 25, 26, 27, 28, 29, 31 and 32 WITH ALVEOLOPLASTY;  Surgeon: Lenn Cal, DDS;  Location: [place];  Service: Oral Surgery;  Laterality: N/A;   ORIF FINGER / THUMB FRACTURE Right    POLYPECTOMY     RIGHT HEART CATH N/A 01/27/2021   Procedure: RIGHT HEART CATH;  Surgeon: Jolaine Artist, MD;  Location: [place] [facility];  Service: Cardiovascular;  Laterality: N/A;   RIGHT/LEFT HEART CATH AND CORONARY ANGIOGRAPHY N/A 09/26/2017   Procedure: RIGHT/LEFT HEART CATH AND CORONARY ANGIOGRAPHY;  Surgeon: Jolaine Artist, MD;  Location: [place] [facility];  Service: Cardiovascular;  Laterality: N/A;   SKIN SPLIT GRAFT Bilateral 10/24/2019   Procedure: SKIN GRAFT SPLIT THICKNESS LEFT THIGH;  Surgeon: Cindra Presume, MD;  Location: [place];  Service: Plastics;  Laterality: Bilateral;   WOUND DEBRIDEMENT Right 08/29/2018   Procedure: Debridement of ulcer on right fifth metatarsal;  Surgeon: Edrick Kins, DPM;  Location: [place];  Service: Podiatry;  Laterality: Right;    Family History: Mother has history of hypertension, heart failure and prior MI. Family History  Problem Relation Age of Onset   Heart disease Mother        MI   Heart failure Mother    Diabetes Mother        ALSO IN MOST OF HIS SIBLINGS; 2 UNLCES HAVE ALSO PASSED AWAY FROM DM   Cardiomyopathy Mother    Cancer - Ovarian Mother    Ovarian cancer Mother     Heart disease Father    Hypertension Father    Diabetes Father    Diabetes Sister    Diabetes Brother    Colon cancer Paternal Uncle    Colon cancer Paternal Uncle    Colon polyps Neg Hx    Esophageal cancer Neg Hx    Rectal cancer Neg Hx    Stomach cancer Neg Hx     Social History: Social History   Socioeconomic History   Marital status: Married    Spouse name: Not on file   Number of children: 5   Years of education: Not on file   Highest education level: Not on file  Occupational History   Occupation: Mortician  Tobacco Use   Smoking status: Never   Smokeless tobacco: Never  Vaping Use   Vaping Use: Never used  Substance and Sexual Activity   Alcohol use: No   Drug use: No   Sexual activity: Not on file  Other Topics Concern   Not on file  Social History Narrative   MARRIED, LIVES [place]; 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   Social Determinants of Health   Financial Resource Strain: Not on file  Food Insecurity: Not on file  Transportation Needs: Not on file  Physical Activity: Not on file  Stress: Not on file  Social Connections: Not on file    Allergies:  Allergies  Allergen Reactions   Nsaids Anaphylaxis and Other (See Comments)    Able to tolerate aspirin     Objective:    Vital Signs:   Temp:  [97.2 F (36.2 C)-98.9 F (37.2 C)] 98.9 F (37.2 C) (09/12 0852) Pulse Rate:  [108-113] 113 (09/12 0852) Resp:  [20] 20 (09/12 0852) BP: (165-167)/(94-102) 167/102 (09/12 0852) SpO2:  [97 %-99 %] 97 % (09/12 0852) Weight:  [140 kg] 140 kg (09/12 0653)    Weight change: Filed Weights   04/11/21 0653  Weight: (!) 140 kg    Intake/Output:   Intake/Output Summary (Last 24 hours) at 04/11/2021 1239 Last data filed at 04/11/2021 0940 Gross per 24 hour  Intake --  Output 300 ml  Net -300 ml      Physical Exam    General:  Obese male in no acute  distress.  HEENT: normal Neck: supple. JVP to jaw. Carotids 2+ bilat; no bruits. No lymphadenopathy or thyromegaly appreciated. Cor: PMI nondisplaced. Regular rate & rhythm. No rubs, gallops or murmurs. Lungs: Scattered crackles in bases  Abdomen: soft, nontender, mildly distended. No hepatosplenomegaly. No bruits or masses. Good bowel sounds. Extremities: no cyanosis, clubbing, rash, trace edema to BLE  Neuro: alert & orientedx3, cranial nerves grossly intact. moves all 4 extremities w/o difficulty. Affect pleasant  Telemetry   ST in 120s. Personally reviewed   EKG    ST with frequent PVCs, rate of 109.    Labs   Basic Metabolic Panel: Recent Labs  Lab 04/11/21 0751  NA 140  K 2.8*  CL 103  CO2 26  GLUCOSE 276*  BUN 13  CREATININE 1.35*  CALCIUM 8.6*    Liver Function Tests: Recent Labs  Lab 04/11/21 0751  AST 20  ALT 11  ALKPHOS 69  BILITOT 0.9  PROT 6.3*  ALBUMIN 2.9*   No results for input(s): LIPASE, AMYLASE in the last 168 hours. No results for input(s): AMMONIA in the last 168 hours.  CBC: Recent Labs  Lab 04/11/21 0751  WBC 10.8*  NEUTROABS 9.2*  HGB 11.5*  HCT 35.7*  MCV 92.2  PLT 186    Cardiac Enzymes: No results for input(s): CKTOTAL, CKMB, CKMBINDEX, TROPONINI in the last 168 hours.  BNP: BNP (last 3 results) Recent Labs    02/04/21 0021 02/05/21 0132 04/11/21 0751  BNP 551.9* 421.9* 612.1*    ProBNP (last 3 results) No results for input(s): PROBNP in the last 8760 hours.   CBG: No results for input(s): GLUCAP in the last 168 hours.  Coagulation Studies: No results for input(s): LABPROT, INR in the last 72 hours.   Imaging   DG Chest 2 View  Result Date: 04/11/2021 CLINICAL DATA:  Chest pain and shortness of breath. EXAM: CHEST - 2 VIEW COMPARISON:  01/26/2021 FINDINGS: Low volume film. The cardio pericardial silhouette is enlarged. Diffuse interstitial and basilar airspace opacity suggests edema. The visualized bony  structures of the thorax show no acute abnormality. IMPRESSION: Low volume film with cardiomegaly and pulmonary edema. Electronically Signed   By: Misty Stanley M.D.   On: 04/11/2021 07:34     Medications:     Current Medications:  [START ON 04/12/2021] aspirin EC  81 mg Oral Daily   carvedilol  6.25 mg Oral BID WC   enoxaparin (LOVENOX) injection  40 mg Subcutaneous Q24H   furosemide  40 mg Intravenous BID   gabapentin  100 mg Oral QHS   insulin aspart  0-9 Units Subcutaneous TID WC   insulin glargine-yfgn  20 Units Subcutaneous BID   pantoprazole  40 mg Oral Daily   rosuvastatin  20 mg Oral Daily   sodium chloride flush  3 mL Intravenous Q12H    Infusions:  sodium chloride        Patient Profile  The patient is a 50 y/o [place] American male with a medical history significant for HFrEF, ischemic cardiomyopathy, CAD, hypertension, hyperlipidemia, type 2 diabetes, PVD s/p right BKA, COVID-19 in 2022 and prior CVA who presented to the [facility] ED via EMS for evaluation of shortness of breath and chest pain. He is admitted for management of acute on chronic HFrEF.    Assessment/Plan   Acute on chronic HFrEF/Ischemic cardiomyopathy  - EF 20 to 25% on echo in June  - Home meds: torsemide 10 mg daily, Coreg 25 mg BID, aspirin 81 mg daily, Crestor  20 mg daily  - s/p IV Lasix in the ED.  Continue IV Lasix at 80 mg BID.  Also add spironolactone 25 mg daily and will start Entresto 49/51 - Strict I/O - Daily weights - Low salt, heart healthy diet with 1500 mL/day fluid restriction  - He now agrees to ICD placement - BMET daily -- creatinine 1.35. Prior recent history of AKI during last admission   2. CAD/NSTEMI  - High-sensitivity troponins elevated to 70 and 529 -- suspect d/t demand ischemia/strain from pulmonary edema - Continue to trend - Known history of CAD -- last heart catheterization in 2019 showed diffuse three-vessel disease with no PCI targets  - EKG with no signs of acute  ischemia  3. Hypokalemia - K of 2.8 on admission -- repleted with 20 mEq of potassium - Replete aggressively to maintain K of >4 (and magnesium of 2) - Spironolactone started at 25 mg daily  - Frequent PVCs noted on EKG and on telemetry review   4. Sinus tachycardia - HR in 120s  - continue home Coreg at 25 mg BID   5. Hypertension - Entresto and spironolactone added to HF regimen  - Continue Coreg   6. Hyperlipidemia  - Continue statin  7. CAD - Last heart catheterization in 2019 found diffuse three-vessel disease not amenable to PCI  - Continue aspirin, statin and beta blocker  - Chest pain now resolved   8. Type 2 diabetes - Last A1c 8.1 on 01/23/2021 - Insulin regimen per primary team  - May benefit from SGLT2i if appropriate   9. PVD s/p R BKA  - Diabetic foot wound complicated by sepsis that led to amputation on 01/25/2021 - Continue aspirin and statin   10. Medical noncompliance - Will request paramedicine services after discharge  - Patient has visual impairment that he reports as a contributing factor to his noncompliance with medications   Medication concerns reviewed with patient and pharmacy team. Barriers identified: Noncompliance   Length of Stay: 0  Darcella Gasman, NP  04/11/2021, 12:39 PM  Advanced Heart Failure Team Pager [phone] (M-F; 7a - 5p)  Please contact [facility] for night-coverage after hours (4p -7a ) and weekends on [email or web address]   Patient seen and examined with the above-signed Advanced Practice Provider and/or Housestaff. I personally reviewed laboratory data, imaging studies and relevant notes. I independently examined the patient and formulated the important aspects of the plan. I have edited the note to reflect any of my changes or salient points. I have personally discussed the plan with the patient and/or family.  50 y/o male with h/o DM2, HTN, CAD, PAD s/p R BKA systolic HF with mixed ICM/NICM EF 25%   Says he ran out of most of  his HF meds including his torsemide for past 4 days. Awoke this am with severe SOB and diaphoresis and some associated chest tightness.   Cath 2019 with 3v CAD but no targets for PCI   Called EMS and brought to ER. On arrival was hypertensive and SOB. CXR + edema. Treated with IV lasix. Now feeling much better. Sitting up in room eating lunch. Denies CP or SOB  Hstrop 70 -> 529 -> 8,567 -> 11,525. ECG non-acute  General:  Sitting up No resp difficulty HEENT: normal Neck: supple. JVP 10 Carotids 2+ bilat; no bruits. No lymphadenopathy or thryomegaly appreciated. Cor: PMI nondisplaced. Regular rate & rhythm. No rubs, gallops or murmurs. Lungs: + basilar crackles  Abdomen: obese soft, nontender, nondistended. No  hepatosplenomegaly. No bruits or masses. Good bowel sounds. Extremities: no cyanosis, clubbing, rash, 2+ edema R BKA Neuro: alert & orientedx3, cranial nerves grossly intact. moves all 4 extremities w/o difficulty. Affect pleasant  My initial suspicion was that this primarily was acute/chronic HF with related demand ischemia in setting of medication noncompliance. However hstroponin has continued to rise and raises suspicion for ACS though he is now asymptomatic.  Will continue diuresis.Supp K. Restart HF meds to control BP. Start heparin and ASA.   Given that he is now asymptomatic and just a te a full meal, will plan cath tomorrow unless he has recurrent symptoms.   Glori Bickers, MD  6:36 PM

## 2021-04-11 NOTE — Progress Notes (Signed)
ANTICOAGULATION CONSULT NOTE - Initial Consult  Pharmacy Consult for heparin Indication: chest pain/ACS  Allergies  Allergen Reactions   Nsaids Anaphylaxis and Other (See Comments)    Able to tolerate aspirin     Patient Measurements: Height: 5\' 11"  (180.3 cm) Weight: (!) 140 kg (308 lb 10.3 oz) IBW/kg (Calculated) : 75.3 Heparin Dosing Weight: 107.9 kg  Vital Signs: Temp: 98.9 F (37.2 C) (09/12 0852) Temp Source: Oral (09/12 0852) BP: 114/52 (09/12 1445) Pulse Rate: 110 (09/12 1445)  Labs: Recent Labs    04/11/21 0751 04/11/21 0938 04/11/21 1434  HGB 11.5*  --   --   HCT 35.7*  --   --   PLT 186  --   --   CREATININE 1.35*  --   --   TROPONINIHS 70* 529* 8,567*    Estimated Creatinine Clearance: 91.6 mL/min (A) (by C-G formula based on SCr of 1.35 mg/dL (H)).   Medical History: Past Medical History:  Diagnosis Date   Bipolar disorder (HCC)    CAD (coronary artery disease)    a. diffuse 3v CAD by cath 2019, medical therapy recommended.   Cataract    forming    Chronic systolic CHF (congestive heart failure) (HCC)    CVA (cerebral infarction)    No residual deficits   Depression    PTSD,    Diabetes mellitus    TYPE II; UNCONTROLLED BY HEMOGLOBIN A1c; STABLE AS  PER DISCHARGE   Headache(784.0)    Herpes simplex of male genitalia    History of colonic polyps    Hyperlipidemia    Hypertension    Myocardial infarction (HCC) 1987   (while playing football)   Obesity    OSA (obstructive sleep apnea)    repeat study 2018 without significant OSA   Pneumonia    Post-cardiac injury syndrome (HCC)    History of cardiac injury from blunt trauma   Pulmonary hypertension (HCC)    a. moderately elevated PASP 07/2019.   PVCs (premature ventricular contractions)    Schizophrenia (HCC)    Goes to [facility]   Sleep apnea    Stroke [facility]) 2005   some left side weakness   Syncope    Recurrent, thought to be vasovagal. Also has h/o frequent  PVCs.     Medications:  Infusions:   sodium chloride      Assessment: 50 yo male presenting with SOB and chest pain and admitted for acute CHF and hypokalemia. Since admission, troponins trended up with max 856. Pharmacy consulted to dose heparin.  Of note, patient has history of three vessel CAD s/p LHC in 2019. Not on anticoagulation PTA.  Hgb 11.5 (BL ~12-13), Plt 186  Goal of Therapy:  Heparin level 0.3-0.7 units/ml Monitor platelets by anticoagulation protocol: Yes   Plan:  Give heparin bolus 4000 units x1 Start heparin infusion 1400 units/hr 6 hour heparin level Daily CBC, heparin level Monitor for s/sx of bleeding  2020, PharmD PGY1 Pharmacy Resident 04/11/2021  4:19 PM  Please check [email or web address] for unit-specific pharmacy phone numbers.

## 2021-04-11 NOTE — Progress Notes (Signed)
Heart Failure Navigator Progress Note  Assessed for Heart & Vascular TOC clinic readiness.  Patient does not meet criteria due to AHF rounding team consulted this admission.   Navigator available for reassessment of patient.   Mardel Grudzien, MSN, RN Heart Failure Nurse Navigator [phone]   

## 2021-04-11 NOTE — H&P (Signed)
History and Physical    Mitchell [name] BOE:[id number] DOB: Oct 02, 1970 DOA: 04/11/2021  Referring MD/NP/PA: Sherrill Raring, PA-C PCP: [name], MD  Patient coming from: Home via EMS  Chief Complaint: Chest tightness  I have personally briefly reviewed patient's old medical records in [place]   HPI: Mitchell [name] is a 50 y.o. male with medical history significant of hypertension, hyperlipidemia, CAD, heart failure with reduced EF, diabetes mellitus type 2 uncontrolled, PVD  s/p right BKA, and CVA presents with complaints of chest tightness that woke him out of his sleep this morning around 4:30-5 AM.  Patient stated that he woke up short of breath, coughing, and diaphoretic.  Denies any complaints of chest pain per se.  He was able to get into his wheelchair and call out for help.  He admits that he may have felt short of breath when laying down at night over the last day or so.  Patient notes that his vision is very poor due to cataracts for which he has an appointment scheduled with [facility] on 18th of this month for further evaluation to see about having them removed.  He normally gives himself his medications and thinks that he may have been out of his torosemide for the last 4 days.  He had not noticed any significant leg swelling, but stated that his stomach had been more distended.  Associated symptoms included nausea and vomiting.  Denies having any significant fever, chills, dysuria, diarrhea, or open wounds.  Last hospitalized in June for sepsis secondary to infected diabetic foot wound with abscess status post right below-knee amputation.  Patient notes that he had been doing well and had planned to go in for his prosthetic fitting today.  En route with EMS patient was given 324 mg aspirin and 1 sublingual nitroglycerin.  Patient had been noted to be hypoxic down to 86% with improvement on 2 L nasal cannula oxygen.  ED Course: Upon admission to the emergency department  patient was seen to be afebrile, pulse 108-113, respirations 20, blood pressure 165/94-167/102, and O2 saturations initially maintained on 3 L of nasal cannula oxygen.  Labs significant for WBC 10.8, hemoglobin 11.5, potassium 2.8, BUN 13, creatinine 1.35, glucose 276, HS-Troponin 70, and BNP 612.1.  Chest x-ray significant for low lung volumes with cardiomegaly and pulmonary edema.  Patient had been ordered torsemide 20 mg p.o., potassium chloride 20 mEq IV and magnesium sulfate 2 g IV.  Review of Systems  Constitutional:  Positive for diaphoresis and malaise/fatigue. Negative for fever.  HENT:  Negative for ear discharge and hearing loss.   Eyes:  Positive for blurred vision. Negative for photophobia.  Respiratory:  Positive for cough and shortness of breath.   Cardiovascular:  Negative for leg swelling.  Gastrointestinal:  Positive for nausea and vomiting. Negative for abdominal pain.  Genitourinary:  Negative for dysuria and hematuria.  Musculoskeletal:  Negative for falls.  Neurological:  Negative for focal weakness and loss of consciousness.  Psychiatric/Behavioral:  Negative for substance abuse. The patient has insomnia.    Past Medical History:  Diagnosis Date   Bipolar disorder ([place])    CAD (coronary artery disease)    a. diffuse 3v CAD by cath 2019, medical therapy recommended.   Cataract    forming    Chronic systolic CHF (congestive heart failure) (HCC)    CVA (cerebral infarction)    No residual deficits   Depression    PTSD,    Diabetes mellitus  TYPE II; UNCONTROLLED BY HEMOGLOBIN A1c; STABLE AS  PER DISCHARGE   Headache(784.0)    Herpes simplex of male genitalia    History of colonic polyps    Hyperlipidemia    Hypertension    Myocardial infarction ([place]) 1987   (while playing football)   Obesity    OSA (obstructive sleep apnea)    repeat study 2018 without significant OSA   Pneumonia    Post-cardiac injury syndrome ([place])    History of cardiac injury from  blunt trauma   Pulmonary hypertension ([place])    a. moderately elevated PASP 07/2019.   PVCs (premature ventricular contractions)    Schizophrenia ([place])    Goes to [facility]   Sleep apnea    Stroke [facility]) 2005   some left side weakness   Syncope    Recurrent, thought to be vasovagal. Also has h/o frequent PVCs.     Past Surgical History:  Procedure Laterality Date   AMPUTATION Right 01/25/2021   Procedure: RIGHT BELOW KNEE AMPUTATION;  Surgeon: [name], MD;  Location: [place];  Service: Orthopedics;  Laterality: Right;   CARDIAC CATHETERIZATION  12/19/10   DIFFUSE NONOBSTRUCTIVE CAD; NONISCHEMIC CARDIOMYOPATHY; LEFT VENTRICULAR ANGIOGRAM WAS PERFORMED SECONDARY TO  ELEVATED LEFT VENTRICULAR FILLING PRESSURES   COLONOSCOPY     ~ age [age]   COLONOSCOPY W/ POLYPECTOMY     I & D EXTREMITY Bilateral 10/24/2019   Procedure: IRRIGATION AND DEBRIDEMENT BILATERAL EXTREMITY WOUND ON FOOT;  Surgeon: Cindra Presume, MD;  Location: [place];  Service: Plastics;  Laterality: Bilateral;   METATARSAL HEAD EXCISION Right 08/29/2018   Procedure: METATARSAL HEAD RESECTION;  Surgeon: Edrick Kins, DPM;  Location: [place];  Service: Podiatry;  Laterality: Right;   METATARSAL OSTEOTOMY Right 08/29/2018   Procedure: SUB FIFTHE METATARSIA RIGHT FOOT;  Surgeon: Edrick Kins, DPM;  Location: [place];  Service: Podiatry;  Laterality: Right;   MULTIPLE EXTRACTIONS WITH ALVEOLOPLASTY  01/27/2014   "all my teeth; 4 Quadrants of alveoloplasty   MULTIPLE EXTRACTIONS WITH ALVEOLOPLASTY N/A 01/27/2014   Procedure: EXTRACTION OF TEETH #'1, 2, 3, 4, 5, 6, 7, 8, 9, 10, 11, 12, 13, 14, 15, 16, 17, 20, 21, 22, 23, 24, 25, 26, 27, 28, 29, 31 and 32 WITH ALVEOLOPLASTY;  Surgeon: Lenn Cal, DDS;  Location: [place];  Service: Oral Surgery;  Laterality: N/A;   ORIF FINGER / THUMB FRACTURE Right    POLYPECTOMY     RIGHT HEART CATH N/A 01/27/2021   Procedure: RIGHT HEART CATH;  Surgeon: Jolaine Artist,  MD;  Location: [place] [facility];  Service: Cardiovascular;  Laterality: N/A;   RIGHT/LEFT HEART CATH AND CORONARY ANGIOGRAPHY N/A 09/26/2017   Procedure: RIGHT/LEFT HEART CATH AND CORONARY ANGIOGRAPHY;  Surgeon: Jolaine Artist, MD;  Location: [place] [facility];  Service: Cardiovascular;  Laterality: N/A;   SKIN SPLIT GRAFT Bilateral 10/24/2019   Procedure: SKIN GRAFT SPLIT THICKNESS LEFT THIGH;  Surgeon: Cindra Presume, MD;  Location: [place];  Service: Plastics;  Laterality: Bilateral;   WOUND DEBRIDEMENT Right 08/29/2018   Procedure: Debridement of ulcer on right fifth metatarsal;  Surgeon: Edrick Kins, DPM;  Location: [place];  Service: Podiatry;  Laterality: Right;     reports that he has never smoked. He has never used smokeless tobacco. He reports that he does not drink alcohol and does not use drugs.  Allergies  Allergen Reactions   Nsaids Anaphylaxis and Other (See Comments)  Able to tolerate aspirin     Family History  Problem Relation Age of Onset   Heart disease Mother        MI   Heart failure Mother    Diabetes Mother        ALSO IN MOST OF HIS SIBLINGS; 2 UNLCES HAVE ALSO PASSED AWAY FROM DM   Cardiomyopathy Mother    Cancer - Ovarian Mother    Ovarian cancer Mother    Heart disease Father    Hypertension Father    Diabetes Father    Diabetes Sister    Diabetes Brother    Colon cancer Paternal Uncle    Colon cancer Paternal Uncle    Colon polyps Neg Hx    Esophageal cancer Neg Hx    Rectal cancer Neg Hx    Stomach cancer Neg Hx     Prior to Admission medications   Medication Sig Start Date End Date Taking? Authorizing Provider  acetaminophen (TYLENOL) 325 MG tablet Take 1-2 tablets (325-650 mg total) by mouth every 8 (eight) hours. Patient not taking: Reported on 02/28/2021 02/17/21   Love, Ivan Anchors, PA-C  ascorbic acid (VITAMIN C) 250 MG tablet Take 1 tablet (250 mg total) by mouth daily. 02/18/21   Bary Leriche, PA-C  aspirin EC 81 MG EC tablet Take 1  tablet (81 mg total) by mouth daily. Patient not taking: No sig reported 11/03/19   Domenic Polite, MD  Blood Glucose Monitoring Suppl (ACCU-CHEK GUIDE ME) w/Device KIT 1 each by Does not apply route 2 (two) times daily. Use to monitor glucose levels BID 06/07/18   Renato Shin, MD  carvedilol (COREG) 6.25 MG tablet Take 1 tablet (6.25 mg total) by mouth 2 (two) times daily with a meal. 02/17/21   Love, Ivan Anchors, PA-C  docusate sodium (COLACE) 100 MG capsule Take 1 capsule (100 mg total) by mouth 3 (three) times daily. Patient not taking: No sig reported 02/17/21   Love, Ivan Anchors, PA-C  doxycycline (VIBRA-TABS) 100 MG tablet Take 1 tablet by mouth twice daily 03/24/21   Persons, Bevely Palmer, PA  gabapentin (NEURONTIN) 100 MG capsule Take 1 capsule (100 mg total) by mouth at bedtime. 03/21/21   Persons, Bevely Palmer, PA  glucose blood (ACCU-CHEK GUIDE) test strip 1 each by Other route in the morning and at bedtime. And lancets 2/day. 11/13/19   Renato Shin, MD  hydrocerin (EUCERIN) CREA Apply 1 application topically 2 (two) times daily. To left foot/leg 02/17/21   Love, Ivan Anchors, PA-C  insulin glargine (LANTUS) 100 UNIT/ML injection Inject 0.21 mLs (21 Units total) into the skin 2 (two) times daily. THIS IS THE SAME AS BASIGLAR--THE INSULIN IS DIVIDED INTO TWICE A DAY FOR BETTER COVERAGE. 02/17/21   Love, Ivan Anchors, PA-C  Lancets 28G MISC Use for glucose testing up to BID 11/02/16   [name], [name], MD  Lancets Misc. (ACCU-CHEK FASTCLIX LANCET) KIT Use to monitor glucose levels BID 06/07/18   Renato Shin, MD  methocarbamol (ROBAXIN) 500 MG tablet Take 1 tablet (500 mg total) by mouth every 6 (six) hours as needed for muscle spasms. 02/17/21   Love, Ivan Anchors, PA-C  Multiple Vitamin (MULTIVITAMIN WITH MINERALS) TABS tablet Take 1 tablet by mouth daily. 02/04/21   Thurnell Lose, MD  nutrition supplement, JUVEN, (JUVEN) PACK Take 1 packet by mouth 2 (two) times daily between meals. 02/17/21   Love, Ivan Anchors, PA-C   oxyCODONE-acetaminophen (PERCOCET) 7.5-325 MG tablet Take 1 tablet by mouth  every 8 (eight) hours as needed for severe pain. 03/21/21   Persons, Bevely Palmer, PA  pantoprazole (PROTONIX) 40 MG tablet Take 1 tablet (40 mg total) by mouth daily. 02/17/21   Love, Ivan Anchors, PA-C  polyethylene glycol (MIRALAX / GLYCOLAX) 17 g packet Take 17 g by mouth daily. 02/18/21   Love, Ivan Anchors, PA-C  rosuvastatin (CRESTOR) 20 MG tablet Take 1 tablet (20 mg total) by mouth daily. 02/03/21   Thurnell Lose, MD  senna (SENOKOT) 8.6 MG TABS tablet Take 2 tablets (17.2 mg total) by mouth at bedtime. 02/17/21   Love, Ivan Anchors, PA-C  simethicone (MYLICON) 80 MG chewable tablet Chew 1 tablet (80 mg total) by mouth 4 (four) times daily. 02/17/21   Love, Ivan Anchors, PA-C  torsemide (DEMADEX) 10 MG tablet Take 1 tablet (10 mg total) by mouth daily. 02/17/21   Bary Leriche, PA-C    Physical Exam:  Constitutional: Middle-age male who appears to be in no acute distress at this Vitals:   04/11/21 0652 04/11/21 0653 04/11/21 0852  BP: (!) 165/94  (!) 167/102  Pulse: (!) 108  (!) 113  Resp: 20  20  Temp: (!) 97.2 F (36.2 C)  98.9 F (37.2 C)  TempSrc: Oral  Oral  SpO2: 99%  97%  Weight:  (!) 140 kg   Height:  5' 11"  (1.803 m)    Eyes: PERRL, lids and conjunctivae normal ENMT: Mucous membranes are moist. Posterior pharynx clear of any exudate or lesions.  Neck: normal, supple, no masses, no thyromegaly Respiratory: Normal respiratory effort with crackles noted in the mid to lower lung fields no significant wheezes or rhonchi appreciated.  O2 saturation currently maintained on 3 L nasal cannula oxygen Cardiovascular: Regular rate and rhythm, positive systolic murmur.  Trace left lower extremity edema. 2+ pedal pulses. No carotid bruits.  Abdomen: Protuberant abdomen no tenderness, no masses palpated. No hepatosplenomegaly. Bowel sounds positive.  Musculoskeletal: no clubbing / cyanosis.  Right BKA Skin: Callus noted of the  left first toe. Neurologic: CN 2-12 grossly intact. Sensation intact, DTR normal. Strength 5/5 in all 4.  Psychiatric: There judgment and insight. Alert and oriented x 3. Normal mood.     Labs on Admission: I have personally reviewed following labs and imaging studies  CBC: Recent Labs  Lab 04/11/21 0751  WBC 10.8*  NEUTROABS 9.2*  HGB 11.5*  HCT 35.7*  MCV 92.2  PLT 030   Basic Metabolic Panel: Recent Labs  Lab 04/11/21 0751  NA 140  K 2.8*  CL 103  CO2 26  GLUCOSE 276*  BUN 13  CREATININE 1.35*  CALCIUM 8.6*   GFR: Estimated Creatinine Clearance: 91.6 mL/min (A) (by C-G formula based on SCr of 1.35 mg/dL (H)). Liver Function Tests: Recent Labs  Lab 04/11/21 0751  AST 20  ALT 11  ALKPHOS 69  BILITOT 0.9  PROT 6.3*  ALBUMIN 2.9*   No results for input(s): LIPASE, AMYLASE in the last 168 hours. No results for input(s): AMMONIA in the last 168 hours. Coagulation Profile: No results for input(s): INR, PROTIME in the last 168 hours. Cardiac Enzymes: No results for input(s): CKTOTAL, CKMB, CKMBINDEX, TROPONINI in the last 168 hours. BNP (last 3 results) No results for input(s): PROBNP in the last 8760 hours. HbA1C: No results for input(s): HGBA1C in the last 72 hours. CBG: No results for input(s): GLUCAP in the last 168 hours. Lipid Profile: No results for input(s): CHOL, HDL, LDLCALC, TRIG, CHOLHDL, LDLDIRECT in  the last 72 hours. Thyroid Function Tests: No results for input(s): TSH, T4TOTAL, FREET4, T3FREE, THYROIDAB in the last 72 hours. Anemia Panel: No results for input(s): VITAMINB12, FOLATE, FERRITIN, TIBC, IRON, RETICCTPCT in the last 72 hours. Urine analysis:    Component Value Date/Time   COLORURINE AMBER (A) 01/24/2021 0339   APPEARANCEUR TURBID (A) 01/24/2021 0339   LABSPEC 1.023 01/24/2021 0339   PHURINE 5.0 01/24/2021 0339   GLUCOSEU 50 (A) 01/24/2021 0339   HGBUR NEGATIVE 01/24/2021 0339   BILIRUBINUR NEGATIVE 01/24/2021 0339    KETONESUR NEGATIVE 01/24/2021 0339   PROTEINUR 100 (A) 01/24/2021 0339   UROBILINOGEN 0.2 12/08/2014 2355   NITRITE NEGATIVE 01/24/2021 0339   LEUKOCYTESUR NEGATIVE 01/24/2021 0339   Sepsis Labs: No results found for this or any previous visit (from the past 240 hour(s)).   Radiological Exams on Admission: DG Chest 2 View  Result Date: 04/11/2021 CLINICAL DATA:  Chest pain and shortness of breath. EXAM: CHEST - 2 VIEW COMPARISON:  01/26/2021 FINDINGS: Low volume film. The cardio pericardial silhouette is enlarged. Diffuse interstitial and basilar airspace opacity suggests edema. The visualized bony structures of the thorax show no acute abnormality. IMPRESSION: Low volume film with cardiomegaly and pulmonary edema. Electronically Signed   By: Misty Stanley M.D.   On: 04/11/2021 07:34    EKG: Independently reviewed.  Sinus tachycardia 109 bpm with multiple PVCs  Assessment/Plan Respiratory failure with hypoxia heart failure with reduced EF: Acute on chronic.  Patient presents with complaints of chest tightness and shortness of breath.  Initially noted to be hypoxic down to 86% with improvement on 2 L of nasal cannula oxygen.  Labs significant for elevated BNP of 612.5.  Chest x-ray significant for cardiomegaly and pulmonary edema.  Last echocardiogram revealed EF of 20-25% in June.  Patient had been ordered torsemide 20 mg p.o. -Admit to a telemetry bed -Heart failure orders set  initiated  -Continuous pulse oximetry with nasal cannula oxygen as needed to keep O2 saturations >92% -Strict I&Os and daily weights -Elevate lower extremities -Check procalcitonin level -Changed torsemide to  Lasix 40 mg IV twice daily -Reassess in a.m. and adjust diuresis as needed. -Question need to resume ACE-I/ARB previously discontinued due to AKI in June -Transitions of care consulted -Cardiology notified of patient admission, will follow-up for any further recommendations  Leukocytosis: Acute.  WBC  elevated 10.8.  Patient was otherwise noted to be afebrile. -Continue to monitor  Elevated troponin CAD with ischemic cardiomyopathy:: Acute.  Patient noted complaints of chest tightness, but denied any formal chest pain.  On admission high-sensitivity troponin initially noted to be 70, but repeat was 529.  Records note last left heart cath from 08/2017 showed diffuse three-vessel coronary artery disease with diabetic vasculopathy no clear targets for intervention at that time. -Continue aspirin, statin, beta-blocker -Will follow-up with cardiology for any additional recommendations -Heparin per pharmacy after repeat troponin increased to 8567  Hypokalemia: Potassium noted to be 2.8.  He had been ordered potassium chloride 20 mEq IV. -Give additional potassium chloride 60 mEq p.o. -Continue to monitor and replace as needed  Diabetes mellitus type 2, uncontrolled: On admission glucose elevated at 276.  Last hemoglobin A1c 8.1 on 6/26.  Home regimen reported to include glargine 60 units daily.  He notes that he had been out of testing strips so he had not been checking his blood sugars regularly. -Hypoglycemic protocol -Check hemoglobin A1c -Semglee 20 units twice daily -CBGs before every meal with sensitive SSI -Will need  prescription sent to pharmacy for testing supplies  Essential hypertension: Blood pressures initially elevated up to 167/102.  Home blood pressure medications include Coreg 6.25 mg twice daily with meals and torsemide 10 mg daily.  He previously had been on an ACE inhibitor but this was discontinued due to acute kidney injury during last hospitalization. -Continue Coreg -Hydralazine IV as needed for elevated blood pressures  S/p right BKA: Patient underwent right BKA after diabetic foot infection in June. -Continue outpatient follow-up with Dr. Sharol Given  Normocytic anemia: Chronic.  Hemoglobin 11.5 g/dL which appears near patient's baseline. -Continue to monitor blood  counts  Cataract: Patient reports having blurry vision due to cataracts. -Recommend patient to keep follow-up appointment on the 18th for evaluation of his cataracts to be removed  Hyperlipidemia -Continue Crestor  GERD -Continue Protonix  Morbid obesity: BMI 43.05 kg/m  DVT prophylaxis: Heparin per pharmacy Code Status: Full Family Communication: Wife updated over the phone Disposition Plan: Home Consults called: Cardiology Admission status: Inpatient require more than 2 midnight stay  Norval Morton MD [facility]   If 7PM-7AM, please contact night-coverage   04/11/2021, 9:27 AM

## 2021-04-11 NOTE — Telephone Encounter (Signed)
Calling to let you know he went to ED via EMS this morning for heart issues.
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
## 2021-04-11 NOTE — Progress Notes (Signed)
ANTICOAGULATION CONSULT NOTE   Pharmacy Consult for heparin Indication: chest pain/ACS  Allergies  Allergen Reactions   Nsaids Anaphylaxis and Other (See Comments)    Able to tolerate aspirin     Patient Measurements: Height: 5' 11.5" (181.6 cm) Weight: 128 kg (282 lb 3 oz) IBW/kg (Calculated) : 76.45 Heparin Dosing Weight: 107.9 kg  Vital Signs: Temp: 99.2 F (37.3 C) (09/12 1935) Temp Source: Oral (09/12 1935) BP: 144/100 (09/12 2224) Pulse Rate: 98 (09/12 1935)  Labs: Recent Labs    04/11/21 0751 04/11/21 0938 04/11/21 1434 04/11/21 1659 04/11/21 2241  HGB 11.5*  --   --   --   --   HCT 35.7*  --   --   --   --   PLT 186  --   --   --   --   APTT  --   --   --  29  --   LABPROT  --   --   --  14.1  --   INR  --   --   --  1.1  --   HEPARINUNFRC  --   --   --   --  0.18*  CREATININE 1.35*  --   --  1.20  --   TROPONINIHS 70* 529* 8,567* 11,525*  --      Estimated Creatinine Clearance: 98.9 mL/min (by C-G formula based on SCr of 1.2 mg/dL).   Medical History: Past Medical History:  Diagnosis Date   Bipolar disorder (HCC)    CAD (coronary artery disease)    a. diffuse 3v CAD by cath 2019, medical therapy recommended.   Cataract    forming    Chronic systolic CHF (congestive heart failure) (HCC)    CVA (cerebral infarction)    No residual deficits   Depression    PTSD,    Diabetes mellitus    TYPE II; UNCONTROLLED BY HEMOGLOBIN A1c; STABLE AS  PER DISCHARGE   Headache(784.0)    Herpes simplex of male genitalia    History of colonic polyps    Hyperlipidemia    Hypertension    Myocardial infarction (HCC) 1987   (while playing football)   Obesity    OSA (obstructive sleep apnea)    repeat study 2018 without significant OSA   Pneumonia    Post-cardiac injury syndrome (HCC)    History of cardiac injury from blunt trauma   Pulmonary hypertension (HCC)    a. moderately elevated PASP 07/2019.   PVCs (premature ventricular contractions)     Schizophrenia (HCC)    Goes to [facility]   Sleep apnea    Stroke [facility]) 2005   some left side weakness   Syncope    Recurrent, thought to be vasovagal. Also has h/o frequent PVCs.     Medications:  Infusions:   sodium chloride     [START ON 04/12/2021] sodium chloride     heparin 1,400 Units/hr (04/11/21 1702)    Assessment: 50 yo male presenting with SOB and chest pain and admitted for acute CHF and hypokalemia. Since admission, troponins trended up with max 856. Pharmacy consulted to dose heparin.  Of note, patient has history of three vessel CAD s/p LHC in 2019. Not on anticoagulation PTA.  Initial heparin level below goal at 0.18 on 1400 units/hr. No bleeding or IV issues noted. Cardiac enzymes are trending up.  Goal of Therapy:  Heparin level 0.3-0.7 units/ml Monitor platelets by anticoagulation protocol: Yes   Plan:  Give  heparin bolus 2000 units x1 Increase heparin infusion to 1700 units/hr 6 hour heparin level Daily CBC, heparin level Monitor for s/sx of bleeding  Sheppard Coil PharmD., BCPS Clinical Pharmacist 04/11/2021 11:50 PM

## 2021-04-11 NOTE — ED Notes (Signed)
RN attempted report x2 

## 2021-04-11 NOTE — ED Provider Notes (Signed)
Medical screening examination/treatment/procedure(s) were conducted as a shared visit with non-physician practitioner(s) and myself.  I personally evaluated the patient during the encounter.  Clinical Impression:   Final diagnoses:  None    This patient is a 50 year old male, he has a known history of congestive heart failure, he also has a history of diabetes, hypertension, he presents to the hospital today with a complaint of increasing shortness of breath and a heaviness across his chest.  He has been out of his torsemide for at least 4 days.   his last left heart catheterization showed a distal right coronary lesion of 70%, other lesions of 85 and 95%, 30 to 35% ejection fraction with a known ischemic cardiomyopathy.  There was "no real options for PCI or surgical revascularization" according to the last catheterization impression.  On exam the patient is tachypneic speaking in shortened sentences and has rales on exam with overt pulmonary edema.  The patient is critically ill with acute congestive heart failure pulmonary edema tachycardia and the need for inpatient admission with diuresis.   EKG Interpretation  Date/Time:  Monday April 11 2021 07:19:26 EDT Ventricular Rate:  109 PR Interval:  162 QRS Duration: 130 QT Interval:  376 QTC Calculation: 506 R Axis:   -62 Text Interpretation: Sinus tachycardia with frequent Premature ventricular complexes Possible Left atrial enlargement Left axis deviation Left ventricular hypertrophy with QRS widening ( Cornell product ) Cannot rule out Septal infarct , age undetermined Possible Lateral infarct , age undetermined Abnormal ECG Premature ventricular complexes Confirmed by Eber Hong ([id number]) on 04/11/2021 7:27:57 AM        .Critical Care Performed by: Eber Hong, MD Authorized by: Eber Hong, MD   Critical care provider statement:    Critical care time (minutes):  35   Critical care time was exclusive of:  Separately  billable procedures and treating other patients and teaching time   Critical care was necessary to treat or prevent imminent or life-threatening deterioration of the following conditions:  Respiratory failure and cardiac failure   Critical care was time spent personally by me on the following activities:  Blood draw for specimens, development of treatment plan with patient or surrogate, discussions with consultants, evaluation of patient's response to treatment, examination of patient, obtaining history from patient or surrogate, ordering and performing treatments and interventions, ordering and review of laboratory studies, ordering and review of radiographic studies, pulse oximetry, re-evaluation of patient's condition and review of old charts    Eber Hong, MD 04/15/21 [phone]

## 2021-04-11 NOTE — Final Progress Note (Addendum)
HS troponins increased from 70 --> 529 --> 8567.  Heparin drip started for ACS and daily aspirin ordered (already receivd 325 mg en route to the ED).  Plan to perform left heart catheterization on 04/12/2021 at 1400.  Discussed with patient-- he is amenable to procedure and verbalized understanding of the risks and benefits.

## 2021-04-11 NOTE — Telephone Encounter (Signed)
FYI-I dont see an encounter note yet. Please advise if there is anything you would like me to do?

## 2021-04-11 NOTE — ED Triage Notes (Addendum)
Patient arrived with EMS from home woke up this morning with SOB / chest tightness , history of CHF , emesis x1 , denies fever or cough. He received ASA 324 mg and 1 NTG sl by EMS .

## 2021-04-12 ENCOUNTER — Encounter (HOSPITAL_COMMUNITY)
Admission: EM | Disposition: A | Discharge status: Home / Self Care | Payer: Self-pay | Source: Home / Self Care | Attending: Family Medicine

## 2021-04-12 DIAGNOSIS — I5023 Acute on chronic systolic (congestive) heart failure: Secondary | ICD-10-CM | POA: Diagnosis not present

## 2021-04-12 HISTORY — PX: RIGHT/LEFT HEART CATH AND CORONARY ANGIOGRAPHY: CATH118266

## 2021-04-12 LAB — CBC
HCT: 33.8 % — ABNORMAL LOW (ref 39.0–52.0)
HCT: 36.3 % — ABNORMAL LOW (ref 39.0–52.0)
Hemoglobin: 11.2 g/dL — ABNORMAL LOW (ref 13.0–17.0)
Hemoglobin: 11.6 g/dL — ABNORMAL LOW (ref 13.0–17.0)
MCH: 29.7 pg (ref 26.0–34.0)
MCH: 29 pg (ref 26.0–34.0)
MCHC: 33.1 g/dL (ref 30.0–36.0)
MCHC: 32 g/dL (ref 30.0–36.0)
MCV: 89.7 fL (ref 80.0–100.0)
MCV: 90.8 fL (ref 80.0–100.0)
Platelets: 198 10*3/uL (ref 150–400)
Platelets: 235 10*3/uL (ref 150–400)
RBC: 3.77 MIL/uL — ABNORMAL LOW (ref 4.22–5.81)
RBC: 4 MIL/uL — ABNORMAL LOW (ref 4.22–5.81)
RDW: 13 % (ref 11.5–15.5)
RDW: 12.9 % (ref 11.5–15.5)
WBC: 9.8 10*3/uL (ref 4.0–10.5)
WBC: 9.3 10*3/uL (ref 4.0–10.5)
nRBC: 0 % (ref 0.0–0.2)
nRBC: 0 % (ref 0.0–0.2)

## 2021-04-12 LAB — BASIC METABOLIC PANEL
Anion gap: 8 (ref 5–15)
Anion gap: 7 (ref 5–15)
BUN: 13 mg/dL (ref 6–20)
BUN: 10 mg/dL (ref 6–20)
CO2: 31 mmol/L (ref 22–32)
CO2: 32 mmol/L (ref 22–32)
Calcium: 8.5 mg/dL — ABNORMAL LOW (ref 8.9–10.3)
Calcium: 8.5 mg/dL — ABNORMAL LOW (ref 8.9–10.3)
Chloride: 104 mmol/L (ref 98–111)
Chloride: 102 mmol/L (ref 98–111)
Creatinine, Ser: 1.2 mg/dL (ref 0.61–1.24)
Creatinine, Ser: 1.16 mg/dL (ref 0.61–1.24)
GFR, Estimated: >60 mL/min (ref >60)
GFR, Estimated: >60 mL/min (ref >60)
Glucose, Bld: 77 mg/dL (ref 70–99)
Glucose, Bld: 108 mg/dL — ABNORMAL HIGH (ref 70–99)
Potassium: 2.9 mmol/L — ABNORMAL LOW (ref 3.5–5.1)
Potassium: 3.4 mmol/L — ABNORMAL LOW (ref 3.5–5.1)
Sodium: 143 mmol/L (ref 135–145)
Sodium: 141 mmol/L (ref 135–145)

## 2021-04-12 LAB — POCT I-STAT 7, (LYTES, BLD GAS, ICA,H+H)
Acid-Base Excess: 4 mmol/L — ABNORMAL HIGH (ref 0.0–2.0)
Bicarbonate: 27.7 mmol/L (ref 20.0–28.0)
Calcium, Ion: 1.08 mmol/L — ABNORMAL LOW (ref 1.15–1.40)
HCT: 33 % — ABNORMAL LOW (ref 39.0–52.0)
Hemoglobin: 11.2 g/dL — ABNORMAL LOW (ref 13.0–17.0)
O2 Saturation: 93 %
Potassium: 3.2 mmol/L — ABNORMAL LOW (ref 3.5–5.1)
Sodium: 145 mmol/L (ref 135–145)
TCO2: 29 mmol/L (ref 22–32)
pCO2 arterial: 38.9 mmHg (ref 32.0–48.0)
pH, Arterial: 7.459 — ABNORMAL HIGH (ref 7.350–7.450)
pO2, Arterial: 65 mmHg — ABNORMAL LOW (ref 83.0–108.0)

## 2021-04-12 LAB — LIPID PANEL
Cholesterol: 217 mg/dL — ABNORMAL HIGH (ref 0–200)
HDL: 30 mg/dL — ABNORMAL LOW (ref >40)
LDL Cholesterol: 166 mg/dL — ABNORMAL HIGH (ref 0–99)
Total CHOL/HDL Ratio: 7.2 RATIO
Triglycerides: 107 mg/dL (ref <150)
VLDL: 21 mg/dL (ref 0–40)

## 2021-04-12 LAB — POCT I-STAT EG7
Acid-Base Excess: 2 mmol/L (ref 0.0–2.0)
Acid-Base Excess: 4 mmol/L — ABNORMAL HIGH (ref 0.0–2.0)
Bicarbonate: 27.3 mmol/L (ref 20.0–28.0)
Bicarbonate: 29 mmol/L — ABNORMAL HIGH (ref 20.0–28.0)
Calcium, Ion: 1.02 mmol/L — ABNORMAL LOW (ref 1.15–1.40)
Calcium, Ion: 1.13 mmol/L — ABNORMAL LOW (ref 1.15–1.40)
HCT: 32 % — ABNORMAL LOW (ref 39.0–52.0)
HCT: 34 % — ABNORMAL LOW (ref 39.0–52.0)
Hemoglobin: 10.9 g/dL — ABNORMAL LOW (ref 13.0–17.0)
Hemoglobin: 11.6 g/dL — ABNORMAL LOW (ref 13.0–17.0)
O2 Saturation: 56 %
O2 Saturation: 63 %
Potassium: 3 mmol/L — ABNORMAL LOW (ref 3.5–5.1)
Potassium: 3.3 mmol/L — ABNORMAL LOW (ref 3.5–5.1)
Sodium: 145 mmol/L (ref 135–145)
Sodium: 146 mmol/L — ABNORMAL HIGH (ref 135–145)
TCO2: 29 mmol/L (ref 22–32)
TCO2: 30 mmol/L (ref 22–32)
pCO2, Ven: 43.5 mmHg — ABNORMAL LOW (ref 44.0–60.0)
pCO2, Ven: 46.4 mmHg (ref 44.0–60.0)
pH, Ven: 7.377 (ref 7.250–7.430)
pH, Ven: 7.432 — ABNORMAL HIGH (ref 7.250–7.430)
pO2, Ven: 30 mmHg — CL (ref 32.0–45.0)
pO2, Ven: 32 mmHg (ref 32.0–45.0)

## 2021-04-12 LAB — GLUCOSE, CAPILLARY
Glucose-Capillary: 80 mg/dL (ref 70–99)
Glucose-Capillary: 92 mg/dL (ref 70–99)
Glucose-Capillary: 133 mg/dL — ABNORMAL HIGH (ref 70–99)
Glucose-Capillary: 173 mg/dL — ABNORMAL HIGH (ref 70–99)

## 2021-04-12 LAB — HEPARIN LEVEL (UNFRACTIONATED): Heparin Unfractionated: 0.24 IU/mL — ABNORMAL LOW (ref 0.30–0.70)

## 2021-04-12 LAB — MAGNESIUM: Magnesium: 1.5 mg/dL — ABNORMAL LOW (ref 1.7–2.4)

## 2021-04-12 SURGERY — RIGHT/LEFT HEART CATH AND CORONARY ANGIOGRAPHY
Anesthesia: LOCAL

## 2021-04-12 MED ORDER — ONDANSETRON HCL 4 MG/2ML IJ SOLN: 4.0000 mg | Freq: Four times a day (QID) | INTRAMUSCULAR | Status: DC | PRN

## 2021-04-12 MED ORDER — POTASSIUM CHLORIDE CRYS ER 20 MEQ PO TBCR
40.0000 meq | EXTENDED_RELEASE_TABLET | Freq: Once | ORAL | Status: DC
  Filled 2021-04-12: qty 2

## 2021-04-12 MED ORDER — MAGNESIUM SULFATE 4 GM/100ML IV SOLN
4.0000 g | Freq: Once | INTRAVENOUS | Status: AC
  Administered 2021-04-12: 4 g via INTRAVENOUS
  Filled 2021-04-12: qty 100

## 2021-04-12 MED ORDER — IOHEXOL 350 MG/ML SOLN
INTRAVENOUS | Status: DC | PRN
  Administered 2021-04-12: 55 mL

## 2021-04-12 MED ORDER — HYDRALAZINE HCL 20 MG/ML IJ SOLN: 10.0000 mg | INTRAMUSCULAR | Status: AC | PRN

## 2021-04-12 MED ORDER — FENTANYL CITRATE (PF) 100 MCG/2ML IJ SOLN
INTRAMUSCULAR | Status: AC
  Filled 2021-04-12: qty 2

## 2021-04-12 MED ORDER — SODIUM CHLORIDE 0.9 % IV SOLN: INTRAVENOUS | Status: AC

## 2021-04-12 MED ORDER — HEPARIN SODIUM (PORCINE) 1000 UNIT/ML IJ SOLN
INTRAMUSCULAR | Status: DC | PRN
  Administered 2021-04-12: 6000 [IU] via INTRAVENOUS

## 2021-04-12 MED ORDER — HEPARIN SODIUM (PORCINE) 1000 UNIT/ML IJ SOLN
INTRAMUSCULAR | Status: AC
  Filled 2021-04-12: qty 1

## 2021-04-12 MED ORDER — ENOXAPARIN SODIUM 40 MG/0.4ML IJ SOSY
40.0000 mg | PREFILLED_SYRINGE | INTRAMUSCULAR | Status: DC
  Administered 2021-04-13: 40 mg via SUBCUTANEOUS
  Filled 2021-04-12: qty 0.4

## 2021-04-12 MED ORDER — HEPARIN (PORCINE) IN NACL 1000-0.9 UT/500ML-% IV SOLN
INTRAVENOUS | Status: DC | PRN
  Administered 2021-04-12 (×2): 500 mL

## 2021-04-12 MED ORDER — FENTANYL CITRATE (PF) 100 MCG/2ML IJ SOLN
INTRAMUSCULAR | Status: DC | PRN
  Administered 2021-04-12: 25 ug via INTRAVENOUS

## 2021-04-12 MED ORDER — POTASSIUM CHLORIDE CRYS ER 20 MEQ PO TBCR
60.0000 meq | EXTENDED_RELEASE_TABLET | Freq: Once | ORAL | Status: AC
  Administered 2021-04-12: 60 meq via ORAL
  Filled 2021-04-12: qty 3

## 2021-04-12 MED ORDER — MIDAZOLAM HCL 2 MG/2ML IJ SOLN
INTRAMUSCULAR | Status: DC | PRN
  Administered 2021-04-12: 1 mg via INTRAVENOUS

## 2021-04-12 MED ORDER — HEPARIN (PORCINE) IN NACL 1000-0.9 UT/500ML-% IV SOLN
INTRAVENOUS | Status: AC
  Filled 2021-04-12: qty 500

## 2021-04-12 MED ORDER — POTASSIUM CHLORIDE CRYS ER 20 MEQ PO TBCR
40.0000 meq | EXTENDED_RELEASE_TABLET | Freq: Once | ORAL | Status: AC
  Administered 2021-04-12: 40 meq via ORAL
  Filled 2021-04-12: qty 2

## 2021-04-12 MED ORDER — LABETALOL HCL 5 MG/ML IV SOLN: 10.0000 mg | INTRAVENOUS | Status: AC | PRN

## 2021-04-12 MED ORDER — SODIUM CHLORIDE 0.9% FLUSH
3.0000 mL | Freq: Two times a day (BID) | INTRAVENOUS | Status: DC
  Administered 2021-04-13: 3 mL via INTRAVENOUS

## 2021-04-12 MED ORDER — VERAPAMIL HCL 2.5 MG/ML IV SOLN
INTRAVENOUS | Status: DC | PRN
  Administered 2021-04-12: 10 mL via INTRA_ARTERIAL

## 2021-04-12 MED ORDER — MIDAZOLAM HCL 2 MG/2ML IJ SOLN
INTRAMUSCULAR | Status: AC
  Filled 2021-04-12: qty 2

## 2021-04-12 MED ORDER — METHOCARBAMOL 500 MG PO TABS
500.0000 mg | ORAL_TABLET | Freq: Three times a day (TID) | ORAL | Status: DC | PRN
  Administered 2021-04-12: 500 mg via ORAL
  Filled 2021-04-12: qty 1

## 2021-04-12 MED ORDER — SODIUM CHLORIDE 0.9 % IV SOLN: 250.0000 mL | INTRAVENOUS | Status: DC | PRN

## 2021-04-12 MED ORDER — LIDOCAINE HCL (PF) 1 % IJ SOLN
INTRAMUSCULAR | Status: DC | PRN
  Administered 2021-04-12 (×2): 2 mL

## 2021-04-12 MED ORDER — VERAPAMIL HCL 2.5 MG/ML IV SOLN
INTRAVENOUS | Status: AC
  Filled 2021-04-12: qty 2

## 2021-04-12 MED ORDER — TORSEMIDE 20 MG PO TABS
20.0000 mg | ORAL_TABLET | Freq: Every day | ORAL | Status: DC
  Administered 2021-04-13: 20 mg via ORAL
  Filled 2021-04-12: qty 1

## 2021-04-12 MED ORDER — SODIUM CHLORIDE 0.9% FLUSH: 3.0000 mL | INTRAVENOUS | Status: DC | PRN

## 2021-04-12 MED ORDER — ACETAMINOPHEN 325 MG PO TABS: 650.0000 mg | ORAL_TABLET | ORAL | Status: DC | PRN

## 2021-04-12 MED ORDER — NITROGLYCERIN 1 MG/10 ML FOR IR/CATH LAB
INTRA_ARTERIAL | Status: AC
  Filled 2021-04-12: qty 10

## 2021-04-12 MED ORDER — LIDOCAINE HCL (PF) 1 % IJ SOLN
INTRAMUSCULAR | Status: AC
  Filled 2021-04-12: qty 30

## 2021-04-12 SURGICAL SUPPLY — 10 items
CATH 5FR JL3.5 JR4 ANG PIG MP (CATHETERS) ×2 IMPLANT
CATH BALLN WEDGE 5F 110CM (CATHETERS) ×2 IMPLANT
DEVICE RAD COMP TR BAND LRG (VASCULAR PRODUCTS) ×2 IMPLANT
GLIDESHEATH SLEND SS 6F .021 (SHEATH) ×2 IMPLANT
GUIDEWIRE INQWIRE 1.5J.035X260 (WIRE) ×1 IMPLANT
INQWIRE 1.5J .035X260CM (WIRE) ×2
PACK CARDIAC CATHETERIZATION (CUSTOM PROCEDURE TRAY) ×2 IMPLANT
SHEATH GLIDE SLENDER 4/5FR (SHEATH) ×2 IMPLANT
TRANSDUCER W/STOPCOCK (MISCELLANEOUS) ×2 IMPLANT
WIRE EMERALD 3MM-J .025X260CM (WIRE) ×2 IMPLANT

## 2021-04-12 NOTE — Progress Notes (Signed)
ANTICOAGULATION CONSULT NOTE   Pharmacy Consult for heparin Indication: chest pain/ACS  Allergies  Allergen Reactions   Nsaids Anaphylaxis and Other (See Comments)    Able to tolerate aspirin     Patient Measurements: Height: 5' 11.5" (181.6 cm) Weight: 126.5 kg (278 lb 14.4 oz) IBW/kg (Calculated) : 76.45 Heparin Dosing Weight: 107.9 kg  Vital Signs: Temp: 98.7 F (37.1 C) (09/13 0512) Temp Source: Oral (09/13 0512) BP: 137/88 (09/13 0512) Pulse Rate: 86 (09/13 0512)  Labs: Recent Labs    04/11/21 0751 04/11/21 0938 04/11/21 1434 04/11/21 1659 04/11/21 2241 04/12/21 0647  HGB 11.5*  --   --   --   --  11.2*  HCT 35.7*  --   --   --   --  33.8*  PLT 186  --   --   --   --  198  APTT  --   --   --  29  --   --   LABPROT  --   --   --  14.1  --   --   INR  --   --   --  1.1  --   --   HEPARINUNFRC  --   --   --   --  0.18* 0.24*  CREATININE 1.35*  --   --  1.20  --  1.20  TROPONINIHS 70* 529* 8,567* 11,525*  --   --      Estimated Creatinine Clearance: 98.3 mL/min (by C-G formula based on SCr of 1.2 mg/dL).   Medical History: Past Medical History:  Diagnosis Date   Bipolar disorder (HCC)    CAD (coronary artery disease)    a. diffuse 3v CAD by cath 2019, medical therapy recommended.   Cataract    forming    Chronic systolic CHF (congestive heart failure) (HCC)    CVA (cerebral infarction)    No residual deficits   Depression    PTSD,    Diabetes mellitus    TYPE II; UNCONTROLLED BY HEMOGLOBIN A1c; STABLE AS  PER DISCHARGE   Headache(784.0)    Herpes simplex of male genitalia    History of colonic polyps    Hyperlipidemia    Hypertension    Myocardial infarction (HCC) 1987   (while playing football)   Obesity    OSA (obstructive sleep apnea)    repeat study 2018 without significant OSA   Pneumonia    Post-cardiac injury syndrome (HCC)    History of cardiac injury from blunt trauma   Pulmonary hypertension (HCC)    a. moderately elevated PASP  07/2019.   PVCs (premature ventricular contractions)    Schizophrenia (HCC)    Goes to [facility]   Sleep apnea    Stroke [facility]) 2005   some left side weakness   Syncope    Recurrent, thought to be vasovagal. Also has h/o frequent PVCs.     Medications:  Infusions:   sodium chloride     sodium chloride 10 mL/hr at 04/12/21 0605   heparin 1,700 Units/hr (04/12/21 0550)    Assessment: 50 yo male presenting with SOB and chest pain and admitted for acute CHF and hypokalemia. Since admission, troponins trended up with max 856. Pharmacy consulted to dose heparin.  Of note, patient has history of three vessel CAD s/p LHC in 2019. Not on anticoagulation PTA.  Heparin level came back subtherapeutic at 0.24, on 1700 units/hr. Hgb 11.2, plt 198. Trop 73>71,062.  No s/sx of bleeding or  infusion issues.   Goal of Therapy:  Heparin level 0.3-0.7 units/ml Monitor platelets by anticoagulation protocol: Yes   Plan:  Increase heparin infusion to 1900 units/hr 6 hour heparin level or f/u after cath Daily CBC, heparin level Monitor for s/sx of bleeding  Sherron Monday, PharmD, BCCCP Clinical Pharmacist  Phone: [phone] 04/12/2021 8:47 AM  Please check AMION for all [facility] phone numbers After 10:00 PM, call Main Pharmacy [phone]

## 2021-04-12 NOTE — H&P (View-Only) (Signed)
Advanced Heart Failure Rounding Note  PCP-Cardiologist: Arvilla Meres, MD   Subjective:    Admitted on 9/12 with acute on chronic HFrEF and NSTEMI.  He will undergo a right and left heart catheterization this afternoon.  Received torsemide and IV Lasix in the ED followed by an additional 80 mg of IV Lasix in the evening.  Net I/O of -3.5L with 3.9L of UOP.  Weight down 4 lbs.  HS troponin increased to 11,525, but he remained asymptomatic with no acute EKG changes noted.   He is asleep and lying flat in bed in no acute distress on room air.  He wakes up easily and answers questions/follows commands appropriately.  He denies chest pain, shortness of breath, abdominal pain and nausea on exam.    Objective:   Weight Range: 126.5 kg Body mass index is 38.36 kg/m.   Vital Signs:   Temp:  [98.7 F (37.1 C)-99.7 F (37.6 C)] 98.7 F (37.1 C) (09/13 0512) Pulse Rate:  [86-119] 86 (09/13 0512) Resp:  [18-31] 18 (09/13 0512) BP: (114-167)/(52-118) 137/88 (09/13 0512) SpO2:  [96 %-100 %] 96 % (09/13 0512) Weight:  [126.5 kg-128 kg] 126.5 kg (09/13 0512) Last BM Date: 04/11/21  Weight change: Filed Weights   04/11/21 0653 04/11/21 1805 04/12/21 0512  Weight: (!) 140 kg 128 kg 126.5 kg    Intake/Output:   Intake/Output Summary (Last 24 hours) at 04/12/2021 0818 Last data filed at 04/12/2021 0605 Gross per 24 hour  Intake 461.35 ml  Output 3325 ml  Net -2863.65 ml      Physical Exam    General:  Well appearing. No resp difficulty HEENT: Normal Neck: Supple. No obvious JVP -- difficult to visualize. Carotids 2+ bilat; no bruits. No lymphadenopathy or thyromegaly appreciated. Cor: PMI nondisplaced. Regular rate & rhythm. No rubs, gallops or murmurs. Lungs: Clear Abdomen: Soft, nontender, nondistended. No hepatosplenomegaly. No bruits or masses. Good bowel sounds. Extremities: No cyanosis, clubbing, rash, edema Neuro: Alert & orientedx3, cranial nerves grossly intact.  moves all 4 extremities w/o difficulty. Affect pleasant   Telemetry   SR in 70s.  Personally reviewed   EKG    None since admission   Labs    CBC Recent Labs    04/11/21 0751 04/12/21 0647  WBC 10.8* 9.8  NEUTROABS 9.2*  --   HGB 11.5* 11.2*  HCT 35.7* 33.8*  MCV 92.2 89.7  PLT 186 198   Basic Metabolic Panel Recent Labs    56/43/32 1659 04/12/21 0647  NA 141 143  K 3.6 2.9*  CL 104 104  CO2 28 31  GLUCOSE 187* 77  BUN 12 13  CREATININE 1.20 1.20  CALCIUM 8.5* 8.5*  MG  --  1.5*   Liver Function Tests Recent Labs    04/11/21 0751  AST 20  ALT 11  ALKPHOS 69  BILITOT 0.9  PROT 6.3*  ALBUMIN 2.9*   No results for input(s): LIPASE, AMYLASE in the last 72 hours. Cardiac Enzymes No results for input(s): CKTOTAL, CKMB, CKMBINDEX, TROPONINI in the last 72 hours.  BNP: BNP (last 3 results) Recent Labs    02/04/21 0021 02/05/21 0132 04/11/21 0751  BNP 551.9* 421.9* 612.1*    ProBNP (last 3 results) No results for input(s): PROBNP in the last 8760 hours.   D-Dimer No results for input(s): DDIMER in the last 72 hours. Hemoglobin A1C Recent Labs    04/11/21 0953  HGBA1C 7.8*   Fasting Lipid Panel No results for input(s):  CHOL, HDL, LDLCALC, TRIG, CHOLHDL, LDLDIRECT in the last 72 hours. Thyroid Function Tests No results for input(s): TSH, T4TOTAL, T3FREE, THYROIDAB in the last 72 hours.  Invalid input(s): FREET3  Other results:   Imaging    No results found.   Medications:     Scheduled Medications:  aspirin EC  81 mg Oral Daily   carvedilol  6.25 mg Oral BID WC   furosemide  80 mg Intravenous BID   gabapentin  100 mg Oral QHS   insulin aspart  0-9 Units Subcutaneous TID WC   insulin glargine-yfgn  20 Units Subcutaneous BID   pantoprazole  40 mg Oral Daily   potassium chloride  60 mEq Oral Once   rosuvastatin  20 mg Oral Daily   sacubitril-valsartan  1 tablet Oral BID   sodium chloride flush  3 mL Intravenous Q12H    sodium chloride flush  3 mL Intravenous Q12H   spironolactone  25 mg Oral Daily    Infusions:  sodium chloride     sodium chloride 10 mL/hr at 04/12/21 4540   heparin 1,700 Units/hr (04/12/21 0550)    PRN Medications: sodium chloride, acetaminophen, hydrALAZINE, sodium chloride flush, sodium chloride flush, zolpidem    Patient Profile   The patient is a 50 y/o [place] American male with a medical history significant for HFrEF, ischemic cardiomyopathy, CAD, hypertension, hyperlipidemia, type 2 diabetes, PVD s/p right BKA, COVID-19 in 2022 and prior CVA who presented to the [facility] ED via EMS for evaluation of shortness of breath and chest pain. He is admitted for management of acute on chronic HFrEF and an NSTEMI.   Assessment/Plan   Acute on chronic HFrEF/Ischemic cardiomyopathy  - EF 20 to 25% on echo in June  - Home meds: torsemide 10 mg daily, Coreg 25 mg BID, aspirin 81 mg daily, Crestor 20 mg daily  - s/p IV Lasix in the ED.  Continue IV Lasix at 80 mg BID.  Also add spironolactone 25 mg daily and will start Entresto 49/51 - Strict I/O - Daily weights - Low salt, heart healthy diet with 1500 mL/day fluid restriction, but is currently NPO for cardiac catheterization - He now agrees to ICD placement - BMET daily -- creatinine improved to 1.2 from 1.35. Prior recent history of AKI during last admission  - Further recommendations pending RHC today    2. CAD/NSTEMI  - High-sensitivity troponins elevated to 70 --> 529 --> 8567 --> 11525.  Initial elevation believed to be secondary to demand ischemia/strain from pulmonary edema, but significant elevation in hsT concerning for ACS; however, he is now asymptomatic  - EKG with no signs of acute ischemia - Known history of CAD -- last heart catheterization in 2019 showed diffuse three-vessel disease with no PCI targets. Plan for [facility] today at 2 pm.  Discussed with patient at length yesterday and he is agreeable to proceed - Continue  heparin drip for ACS  - Continue aspirin, statin and beta blocker  - Further recommendations pending cardiac catheterization    3. Hypokalemia/Hypomagnesemia - K of 2.8 on admission -- repleted with improvement to 3.6, but is now 2.9 this AM  - Replete aggressively to maintain K of >4 (and magnesium of 2) - Spironolactone started at 25 mg daily  - Ectopy improved  - Mg 1.5 on 9/13 -- replete    4. Sinus tachycardia - Resolved -- suspect r/t volume overload and SOB  - continue home Coreg at 25 mg BID    5. Hypertension -  Entresto and spironolactone added to HF regimen  - Continue Coreg    6. Hyperlipidemia  - Continue statin   7. CAD - Last heart catheterization in 2019 found diffuse three-vessel disease not amenable to PCI  - Continue aspirin, statin and beta blocker  - Further recommendations pending LHC today    8. Type 2 diabetes - Last A1c 8.1 on 01/23/2021 - Insulin regimen per primary team  - May benefit from SGLT2i if appropriate    9. PVD s/p R BKA  - Diabetic foot wound complicated by sepsis that led to amputation on 01/25/2021 - Continue aspirin and statin    10. Medical noncompliance - Will request paramedicine services after discharge  - Patient has visual impairment that he reports as a contributing factor to his noncompliance with medications. TOC also consulted   Medication concerns reviewed with patient and pharmacy team. Barriers identified: Noncompliance   Length of Stay: 1  Peter Garter, NP  04/12/2021, 8:18 AM  Advanced Heart Failure Team Pager [phone] (M-F; 7a - 5p)  Please contact [facility] for night-coverage after hours (5p -7a ) and weekends on [email or web address]    Patient seen and examined with the above-signed Advanced Practice Provider and/or Housestaff. I personally reviewed laboratory data, imaging studies and relevant notes. I independently examined the patient and formulated the important aspects of the plan. I have edited the note to  reflect any of my changes or salient points. I have personally discussed the plan with the patient and/or family.  Denies CP or SOB. Diuresed well. Remains on heparin. No bleeding. K and mag low and have been supped.   General:  Sitting up in bed No resp difficulty HEENT: normal Neck: supple. JVP 7-8 Carotids 2+ bilat; no bruits. No lymphadenopathy or thryomegaly appreciated. Cor: PMI nondisplaced. Regular rate & rhythm. No rubs, gallops or murmurs. Lungs: clear Abdomen: soft, nontender, nondistended. No hepatosplenomegaly. No bruits or masses. Good bowel sounds. Extremities: no cyanosis, clubbing, rash, 1+ edema s/p R BKA Neuro: alert & orientedx3, cranial nerves grossly intact. moves all 4 extremities w/o difficulty. Affect pleasant  He is improving. Continue diuresis and titration of GDMT. Supp lytes. Continue heparin. For R/L cath today. We discussed procedure.   Arvilla Meres, MD  1:29 PM

## 2021-04-12 NOTE — Progress Notes (Signed)
PT Cancellation Note  Patient Details Name: Mitchell [name] MRN: [id number] DOB: 1970/12/12   Cancelled Treatment:    Reason Eval/Treat Not Completed: Medical issues which prohibited therapy.  Hold therapy today, elevating troponin and heart cath today. Follow up at another time.   Ivar Drape 04/12/2021, 10:21 AM  Samul Dada, PT MS Acute Rehab Dept. Number: [facility] [id number] and [facility] [phone]

## 2021-04-12 NOTE — Progress Notes (Signed)
[facility] PROGRESS NOTE    JENE ORAVEC  ONG:[id number] DOB: 1971/01/24 DOA: 04/11/2021 PCP: Pearline Cables, MD      Brief Narrative:  Mr. Kohles is a 50 y.o. M with sCHF EF 20-25%, obesity, DM, and CAD who presented with acute SOB and chest discomfort.  In the ER, got aspirin 325 and NTG.  SpO2 86% on room air, tachypneic to 31 and SOB. CXR with edema.  Started on Lasix and admitted initally for CHF and demand ischemia.  Subsequently, Hstrop noted to jump to >11,000, and so he was started on heparin.           Assessment & Plan:  NSTEMI Coronary disease Peripheral vascular disease - LHC today per Cardiology -Post ACS quality metrics per cardiology - Continue aspirin, Crestor - Continue BB   Acute on chronic systolic and diastolic CHF  Net negative 3.4L, Cr stable, K low. - Diuretics per CHF team - Continue Entresto, spironolactone, beta-blocker  Hypertension BP controlled - Continue Coreg, Entresto, spironolactone  Anemia of chronic disease In setting of CHF, Hgb stable relative to baseline  Type 2 Diabetes with polyneuropathy Glucoses controlled - Continue glargine - Continue SS corrections - Continue gabapentin  Morbid obesity BMI 38 - Continue PPI  Hypokalemia Hypomagnesemia - Supplement K, mag           Disposition: Status is: Inpatient  Remains inpatient appropriate because:Inpatient level of care appropriate due to severity of illness  Dispo: The patient is from: Home              Anticipated d/c is to: Home              Patient currently is not medically stable to d/c.   Difficult to place patient No       Level of care: Telemetry Cardiac       MDM: The below labs and imaging reports were reviewed and summarized above.  Medication management as above.    DVT prophylaxis: enoxaparin (LOVENOX) injection 40 mg Start: 04/13/21 0800  Code Status:  FULL Family Communication: Wife by  phone    Consultants:  Cardiology  Procedures:  9/13 LHC  Antimicrobials:     Culture data:             Subjective: No headache, chest pain, abdominal pain, swelling, orthopnea.  Objective: Vitals:   04/12/21 1404 04/12/21 1409 04/12/21 1413 04/12/21 1444  BP: (!) 144/86 (!) 142/91 (!) 148/89 (!) 146/88  Pulse: 77 78 80 82  Resp: 20 16 (!) 39 12  Temp:    98.3 F (36.8 C)  TempSrc:    Oral  SpO2: 96% 96% 100% 100%  Weight:      Height:        Intake/Output Summary (Last 24 hours) at 04/12/2021 1450 Last data filed at 04/12/2021 1100 Gross per 24 hour  Intake 461.35 ml  Output 3350 ml  Net -2888.65 ml   Filed Weights   04/11/21 0653 04/11/21 1805 04/12/21 0512  Weight: (!) 140 kg 128 kg 126.5 kg    Examination: General appearance:  adult male, alert and in no acute distress.  Sitting up in bed. HEENT: Anicteric, conjunctiva pink, lids and lashes normal. No nasal deformity, discharge, epistaxis.  Lips moist.   Skin: Warm and dry.  no jaundice.  No suspicious rashes or lesions. Cardiac: RRR, nl S1-S2, no murmurs appreciated.  Capillary refill is brisk.  No LE edema.  Radial  pulses 2+ and  symmetric. Respiratory: Normal respiratory rate and rhythm.  CTAB without rales or wheezes. Abdomen: Abdomen soft.  no TTP. No ascites, distension, hepatosplenomegaly.   MSK: No deformities or effusions.  Status post BKA Neuro: Awake and alert.  EOMI, moves all existing extremities with normal strength and coordination. Speech fluent.    Psych: Sensorium intact and responding to questions, attention normal. Affect appropriate.  Judgment and insight appear normal.    Data Reviewed: I have personally reviewed following labs and imaging studies:  CBC: Recent Labs  Lab 04/11/21 0751 04/12/21 0647  WBC 10.8* 9.8  NEUTROABS 9.2*  --   HGB 11.5* 11.2*  HCT 35.7* 33.8*  MCV 92.2 89.7  PLT 186 198   Basic Metabolic Panel: Recent Labs  Lab 04/11/21 0751  04/11/21 1659 04/12/21 0647  NA 140 141 143  K 2.8* 3.6 2.9*  CL 103 104 104  CO2 26 28 31   GLUCOSE 276* 187* 77  BUN 13 12 13   CREATININE 1.35* 1.20 1.20  CALCIUM 8.6* 8.5* 8.5*  MG  --   --  1.5*   GFR: Estimated Creatinine Clearance: 98.3 mL/min (by C-G formula based on SCr of 1.2 mg/dL). Liver Function Tests: Recent Labs  Lab 04/11/21 0751  AST 20  ALT 11  ALKPHOS 69  BILITOT 0.9  PROT 6.3*  ALBUMIN 2.9*   No results for input(s): LIPASE, AMYLASE in the last 168 hours. No results for input(s): AMMONIA in the last 168 hours. Coagulation Profile: Recent Labs  Lab 04/11/21 1659  INR 1.1   Cardiac Enzymes: No results for input(s): CKTOTAL, CKMB, CKMBINDEX, TROPONINI in the last 168 hours. BNP (last 3 results) No results for input(s): PROBNP in the last 8760 hours. HbA1C: Recent Labs    04/11/21 0953  HGBA1C 7.8*   CBG: Recent Labs  Lab 04/11/21 1651 04/11/21 1933 04/11/21 2118 04/12/21 0608 04/12/21 1116  GLUCAP 183* 155* 191* 80 92   Lipid Profile: Recent Labs    04/12/21 0647  CHOL 217*  HDL 30*  LDLCALC 166*  TRIG 107  CHOLHDL 7.2   Thyroid Function Tests: No results for input(s): TSH, T4TOTAL, FREET4, T3FREE, THYROIDAB in the last 72 hours. Anemia Panel: No results for input(s): VITAMINB12, FOLATE, FERRITIN, TIBC, IRON, RETICCTPCT in the last 72 hours. Urine analysis:    Component Value Date/Time   COLORURINE AMBER (A) 01/24/2021 0339   APPEARANCEUR TURBID (A) 01/24/2021 0339   LABSPEC 1.023 01/24/2021 0339   PHURINE 5.0 01/24/2021 0339   GLUCOSEU 50 (A) 01/24/2021 0339   HGBUR NEGATIVE 01/24/2021 0339   BILIRUBINUR NEGATIVE 01/24/2021 0339   KETONESUR NEGATIVE 01/24/2021 0339   PROTEINUR 100 (A) 01/24/2021 0339   UROBILINOGEN 0.2 12/08/2014 2355   NITRITE NEGATIVE 01/24/2021 0339   LEUKOCYTESUR NEGATIVE 01/24/2021 0339   Sepsis Labs: @LABRCNTIP (procalcitonin:4,lacticacidven:4)  ) Recent Results (from the past 240 hour(s))   Resp Panel by RT-PCR (Flu A&B, Covid) Nasopharyngeal Swab     Status: None   Collection Time: 04/11/21  7:15 AM   Specimen: Nasopharyngeal Swab; Nasopharyngeal(NP) swabs in vial transport medium  Result Value Ref Range Status   SARS Coronavirus 2 by RT PCR NEGATIVE NEGATIVE Final    Comment: (NOTE) SARS-CoV-2 target nucleic acids are NOT DETECTED.  The SARS-CoV-2 RNA is generally detectable in upper respiratory specimens during the acute phase of infection. The lowest concentration of SARS-CoV-2 viral copies this assay can detect is 138 copies/mL. A negative result does not preclude SARS-Cov-2 infection and should not be used as  the sole basis for treatment or other patient management decisions. A negative result may occur with  improper specimen collection/handling, submission of specimen other than nasopharyngeal swab, presence of viral mutation(s) within the areas targeted by this assay, and inadequate number of viral copies(<138 copies/mL). A negative result must be combined with clinical observations, patient history, and epidemiological information. The expected result is Negative.  Fact Sheet for Patients:  BloggerCourse.com  Fact Sheet for Healthcare Providers:  SeriousBroker.it  This test is no t yet approved or cleared by the [place] FDA and  has been authorized for detection and/or diagnosis of SARS-CoV-2 by FDA under an Emergency Use Authorization (EUA). This EUA will remain  in effect (meaning this test can be used) for the duration of the COVID-19 declaration under Section 564(b)(1) of the Act, 21 U.S.C.section 360bbb-3(b)(1), unless the authorization is terminated  or revoked sooner.       Influenza A by PCR NEGATIVE NEGATIVE Final   Influenza B by PCR NEGATIVE NEGATIVE Final    Comment: (NOTE) The Xpert Xpress SARS-CoV-2/FLU/RSV plus assay is intended as an aid in the diagnosis of influenza from  Nasopharyngeal swab specimens and should not be used as a sole basis for treatment. Nasal washings and aspirates are unacceptable for Xpert Xpress SARS-CoV-2/FLU/RSV testing.  Fact Sheet for Patients: BloggerCourse.com  Fact Sheet for Healthcare Providers: SeriousBroker.it  This test is not yet approved or cleared by the [place] FDA and has been authorized for detection and/or diagnosis of SARS-CoV-2 by FDA under an Emergency Use Authorization (EUA). This EUA will remain in effect (meaning this test can be used) for the duration of the COVID-19 declaration under Section 564(b)(1) of the Act, 21 U.S.C. section 360bbb-3(b)(1), unless the authorization is terminated or revoked.  Performed at [facility], [address], [place], [place] [address]          Radiology Studies: DG Chest 2 View  Result Date: 04/11/2021 CLINICAL DATA:  Chest pain and shortness of breath. EXAM: CHEST - 2 VIEW COMPARISON:  01/26/2021 FINDINGS: Low volume film. The cardio pericardial silhouette is enlarged. Diffuse interstitial and basilar airspace opacity suggests edema. The visualized bony structures of the thorax show no acute abnormality. IMPRESSION: Low volume film with cardiomegaly and pulmonary edema. Electronically Signed   By: Kennith Center M.D.   On: 04/11/2021 07:34        Scheduled Meds:  aspirin EC  81 mg Oral Daily   carvedilol  6.25 mg Oral BID WC   [START ON 04/13/2021] enoxaparin (LOVENOX) injection  40 mg Subcutaneous Q24H   gabapentin  100 mg Oral QHS   insulin aspart  0-9 Units Subcutaneous TID WC   insulin glargine-yfgn  20 Units Subcutaneous BID   pantoprazole  40 mg Oral Daily   rosuvastatin  20 mg Oral Daily   sacubitril-valsartan  1 tablet Oral BID   sodium chloride flush  3 mL Intravenous Q12H   sodium chloride flush  3 mL Intravenous Q12H   spironolactone  25 mg Oral Daily   [START ON 04/13/2021] torsemide  20  mg Oral Daily   Continuous Infusions:  sodium chloride     sodium chloride     sodium chloride       LOS: 1 day    Time spent: 25 minutes    Alberteen Sam, MD [facility] 04/12/2021, 2:50 PM     Please page though AMION or Epic secure chat:  For [employer], Higher education careers adviser

## 2021-04-12 NOTE — Progress Notes (Addendum)
Nutrition Education Note  RD consulted for nutrition education regarding CHF and diabetes.  Lab Results  Component Value Date   HGBA1C 7.8 (H) 04/11/2021  PTA DM medications are 21 units inuslin glargine BID.   Labs reviewed: K: 2.9, CBGS: 80-191 (inpatient orders for glycemic control are 0-9 units insulin aspart TID with meals and 20 units insulin glargine-yfgn BID).    Pt admitted with respiratory failure with hypoxia secondary to heart failure with reduced EF.   6/28- s/p rt BKA  Reviewed I/O's: -3.5 L x 24 hours  UOP: 3.9 L x 24 hours  Pt unavailable at time of visit. Attempted to speak with pt via call to hospital room phone, however, unable to reach. RD unable to obtain further nutrition-related history or complete nutrition-focused physical exam at this time.  RD will attempt to re-visit pt as time allows.   Per chart review, pt with medication non-compliance as a result of visual impairment secondary to glaucoma.   RD provided "Plate Method" handout from the Academy of Nutrition and Dietetics. Pt would also benefit from outpatient diabetes education for further reinforcement. RD placed referral to [facility].   Current diet order is NPO (previously heart healthy/ carb modified with 1.5 L fluid restriction), patient is consuming approximately n/a% of meals at this time. Labs and medications reviewed. No further nutrition interventions warranted at this time. RD contact information provided. If additional nutrition issues arise, please re-consult RD.   Levada Schilling, RD, LDN, CDCES Registered Dietitian II Certified Diabetes Care and Education Specialist Please refer to [facility] for RD and/or RD on-call/weekend/after hours pager

## 2021-04-12 NOTE — Progress Notes (Signed)
14 cc of air removed from TR band. Patient tolerated well.
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
## 2021-04-12 NOTE — Progress Notes (Addendum)
Advanced Heart Failure Rounding Note  PCP-Cardiologist: Arvilla Meres, MD   Subjective:    Admitted on 9/12 with acute on chronic HFrEF and NSTEMI.  He will undergo a right and left heart catheterization this afternoon.  Received torsemide and IV Lasix in the ED followed by an additional 80 mg of IV Lasix in the evening.  Net I/O of -3.5L with 3.9L of UOP.  Weight down 4 lbs.  HS troponin increased to 11,525, but he remained asymptomatic with no acute EKG changes noted.   He is asleep and lying flat in bed in no acute distress on room air.  He wakes up easily and answers questions/follows commands appropriately.  He denies chest pain, shortness of breath, abdominal pain and nausea on exam.    Objective:   Weight Range: 126.5 kg Body mass index is 38.36 kg/m.   Vital Signs:   Temp:  [98.7 F (37.1 C)-99.7 F (37.6 C)] 98.7 F (37.1 C) (09/13 0512) Pulse Rate:  [86-119] 86 (09/13 0512) Resp:  [18-31] 18 (09/13 0512) BP: (114-167)/(52-118) 137/88 (09/13 0512) SpO2:  [96 %-100 %] 96 % (09/13 0512) Weight:  [126.5 kg-128 kg] 126.5 kg (09/13 0512) Last BM Date: 04/11/21  Weight change: Filed Weights   04/11/21 0653 04/11/21 1805 04/12/21 0512  Weight: (!) 140 kg 128 kg 126.5 kg    Intake/Output:   Intake/Output Summary (Last 24 hours) at 04/12/2021 0818 Last data filed at 04/12/2021 0605 Gross per 24 hour  Intake 461.35 ml  Output 3325 ml  Net -2863.65 ml      Physical Exam    General:  Well appearing. No resp difficulty HEENT: Normal Neck: Supple. No obvious JVP -- difficult to visualize. Carotids 2+ bilat; no bruits. No lymphadenopathy or thyromegaly appreciated. Cor: PMI nondisplaced. Regular rate & rhythm. No rubs, gallops or murmurs. Lungs: Clear Abdomen: Soft, nontender, nondistended. No hepatosplenomegaly. No bruits or masses. Good bowel sounds. Extremities: No cyanosis, clubbing, rash, edema Neuro: Alert & orientedx3, cranial nerves grossly intact.  moves all 4 extremities w/o difficulty. Affect pleasant   Telemetry   SR in 70s.  Personally reviewed   EKG    None since admission   Labs    CBC Recent Labs    04/11/21 0751 04/12/21 0647  WBC 10.8* 9.8  NEUTROABS 9.2*  --   HGB 11.5* 11.2*  HCT 35.7* 33.8*  MCV 92.2 89.7  PLT 186 198   Basic Metabolic Panel Recent Labs    56/43/32 1659 04/12/21 0647  NA 141 143  K 3.6 2.9*  CL 104 104  CO2 28 31  GLUCOSE 187* 77  BUN 12 13  CREATININE 1.20 1.20  CALCIUM 8.5* 8.5*  MG  --  1.5*   Liver Function Tests Recent Labs    04/11/21 0751  AST 20  ALT 11  ALKPHOS 69  BILITOT 0.9  PROT 6.3*  ALBUMIN 2.9*   No results for input(s): LIPASE, AMYLASE in the last 72 hours. Cardiac Enzymes No results for input(s): CKTOTAL, CKMB, CKMBINDEX, TROPONINI in the last 72 hours.  BNP: BNP (last 3 results) Recent Labs    02/04/21 0021 02/05/21 0132 04/11/21 0751  BNP 551.9* 421.9* 612.1*    ProBNP (last 3 results) No results for input(s): PROBNP in the last 8760 hours.   D-Dimer No results for input(s): DDIMER in the last 72 hours. Hemoglobin A1C Recent Labs    04/11/21 0953  HGBA1C 7.8*   Fasting Lipid Panel No results for input(s):  CHOL, HDL, LDLCALC, TRIG, CHOLHDL, LDLDIRECT in the last 72 hours. Thyroid Function Tests No results for input(s): TSH, T4TOTAL, T3FREE, THYROIDAB in the last 72 hours.  Invalid input(s): FREET3  Other results:   Imaging    No results found.   Medications:     Scheduled Medications:  aspirin EC  81 mg Oral Daily   carvedilol  6.25 mg Oral BID WC   furosemide  80 mg Intravenous BID   gabapentin  100 mg Oral QHS   insulin aspart  0-9 Units Subcutaneous TID WC   insulin glargine-yfgn  20 Units Subcutaneous BID   pantoprazole  40 mg Oral Daily   potassium chloride  60 mEq Oral Once   rosuvastatin  20 mg Oral Daily   sacubitril-valsartan  1 tablet Oral BID   sodium chloride flush  3 mL Intravenous Q12H    sodium chloride flush  3 mL Intravenous Q12H   spironolactone  25 mg Oral Daily    Infusions:  sodium chloride     sodium chloride 10 mL/hr at 04/12/21 0605   heparin 1,700 Units/hr (04/12/21 0550)    PRN Medications: sodium chloride, acetaminophen, hydrALAZINE, sodium chloride flush, sodium chloride flush, zolpidem    Patient Profile   The patient is a 50 y/o African American male with a medical history significant for HFrEF, ischemic cardiomyopathy, CAD, hypertension, hyperlipidemia, type 2 diabetes, PVD s/p right BKA, COVID-19 in 2022 and prior CVA who presented to [facility] ED via EMS for evaluation of shortness of breath and chest pain. He is admitted for management of acute on chronic HFrEF and an NSTEMI.   Assessment/Plan   Acute on chronic HFrEF/Ischemic cardiomyopathy  - EF 20 to 25% on echo in June  - Home meds: torsemide 10 mg daily, Coreg 25 mg BID, aspirin 81 mg daily, Crestor 20 mg daily  - s/p IV Lasix in the ED.  Continue IV Lasix at 80 mg BID.  Also add spironolactone 25 mg daily and will start Entresto 49/51 - Strict I/O - Daily weights - Low salt, heart healthy diet with 1500 mL/day fluid restriction, but is currently NPO for cardiac catheterization - He now agrees to ICD placement - BMET daily -- creatinine improved to 1.2 from 1.35. Prior recent history of AKI during last admission  - Further recommendations pending RHC today    2. CAD/NSTEMI  - High-sensitivity troponins elevated to 70 --> 529 --> 8567 --> 11525.  Initial elevation believed to be secondary to demand ischemia/strain from pulmonary edema, but significant elevation in hsT concerning for ACS; however, he is now asymptomatic  - EKG with no signs of acute ischemia - Known history of CAD -- last heart catheterization in 2019 showed diffuse three-vessel disease with no PCI targets. Plan for L/RHC today at 2 pm.  Discussed with patient at length yesterday and he is agreeable to proceed - Continue  heparin drip for ACS  - Continue aspirin, statin and beta blocker  - Further recommendations pending cardiac catheterization    3. Hypokalemia/Hypomagnesemia - K of 2.8 on admission -- repleted with improvement to 3.6, but is now 2.9 this AM  - Replete aggressively to maintain K of >4 (and magnesium of 2) - Spironolactone started at 25 mg daily  - Ectopy improved  - Mg 1.5 on 9/13 -- replete    4. Sinus tachycardia - Resolved -- suspect r/t volume overload and SOB  - continue home Coreg at 25 mg BID    5. Hypertension -   Entresto and spironolactone added to HF regimen  - Continue Coreg    6. Hyperlipidemia  - Continue statin   7. CAD - Last heart catheterization in 2019 found diffuse three-vessel disease not amenable to PCI  - Continue aspirin, statin and beta blocker  - Further recommendations pending LHC today    8. Type 2 diabetes - Last A1c 8.1 on 01/23/2021 - Insulin regimen per primary team  - May benefit from SGLT2i if appropriate    9. PVD s/p R BKA  - Diabetic foot wound complicated by sepsis that led to amputation on 01/25/2021 - Continue aspirin and statin    10. Medical noncompliance - Will request paramedicine services after discharge  - Patient has visual impairment that he reports as a contributing factor to his noncompliance with medications. TOC also consulted   Medication concerns reviewed with patient and pharmacy team. Barriers identified: Noncompliance   Length of Stay: 1  Kim M Carollo, NP  04/12/2021, 8:18 AM  Advanced Heart Failure Team Pager [phone] (M-F; 7a - 5p)  Please contact [facility] for night-coverage after hours (5p -7a ) and weekends on [email or web address]    Patient seen and examined with the above-signed Advanced Practice Provider and/or Housestaff. I personally reviewed laboratory data, imaging studies and relevant notes. I independently examined the patient and formulated the important aspects of the plan. I have edited the note to  reflect any of my changes or salient points. I have personally discussed the plan with the patient and/or family.  Denies CP or SOB. Diuresed well. Remains on heparin. No bleeding. K and mag low and have been supped.   General:  Sitting up in bed No resp difficulty HEENT: normal Neck: supple. JVP 7-8 Carotids 2+ bilat; no bruits. No lymphadenopathy or thryomegaly appreciated. Cor: PMI nondisplaced. Regular rate & rhythm. No rubs, gallops or murmurs. Lungs: clear Abdomen: soft, nontender, nondistended. No hepatosplenomegaly. No bruits or masses. Good bowel sounds. Extremities: no cyanosis, clubbing, rash, 1+ edema s/p R BKA Neuro: alert & orientedx3, cranial nerves grossly intact. moves all 4 extremities w/o difficulty. Affect pleasant  He is improving. Continue diuresis and titration of GDMT. Supp lytes. Continue heparin. For R/L cath today. We discussed procedure.   Jimmylee Ratterree, MD  1:29 PM  

## 2021-04-12 NOTE — Telephone Encounter (Signed)
[facility] admitted to [facility]. Provider aware.

## 2021-04-12 NOTE — Discharge Instructions (Addendum)
Plate Method for Diabetes   Foods with carbohydrates make your blood glucose level go up. The plate method is a simple way to meal plan and control the amount of carbohydrate you eat.         Use the following guidance to build a healthy plate to control carbohydrates. Divide a 9-inch plate into 3 sections, and consider your beverage the 4th section of your meal: Food Group Examples of Foods/Beverages for This Section of your Meal  Section 1: Non-starchy vegetables Fill  of your plate to include non-starchy vegetables Asparagus, broccoli, brussels sprouts, cabbage, carrots, cauliflower, celery, cucumber, green beans, mushrooms, peppers, salad greens, tomatoes, or zucchini.  Section 2: Protein foods Fill  of your plate to include a lean protein Lean meat, poultry, fish, seafood, cheese, eggs, lean deli meat, tofu, beans, lentils, nuts or nut butters.  Section 3: Carbohydrate foods Fill  of your plate to include carbohydrate foods Whole grains, whole wheat bread, brown rice, whole grain pasta, polenta, corn tortillas, fruit, or starchy vegetables (potatoes, green peas, corn, beans, acorn squash, and butternut squash). One cup of milk also counts as a food that contains carbohydrate.  Section 4: Beverage Choose water or a low-calorie drink for your beverage. Unsweetened tea, coffee, or flavored/sparkling water without added sugar.  Image reprinted with permission from The American Diabetes Association.  Copyright 2022 by the American Diabetes Association.   Copyright 2022  Academy of Nutrition and Dietetics. All rights reserved   

## 2021-04-12 NOTE — Interval H&P Note (Signed)
History and Physical Interval Note:  04/12/2021 1:32 PM  Mitchell [name]  has presented today for surgery, with the diagnosis of heart failure.  The various methods of treatment have been discussed with the patient and family. After consideration of risks, benefits and other options for treatment, the patient has consented to  Procedure(s): RIGHT/LEFT HEART CATH AND CORONARY ANGIOGRAPHY (N/A) and possible coronary angioplasty as a surgical intervention.  The patient's history has been reviewed, patient examined, no change in status, stable for surgery.  I have reviewed the patient's chart and labs.  Questions were answered to the patient's satisfaction.     [name]

## 2021-04-13 ENCOUNTER — Other Ambulatory Visit (HOSPITAL_COMMUNITY): Payer: Self-pay

## 2021-04-13 ENCOUNTER — Encounter (HOSPITAL_COMMUNITY): Payer: Self-pay | Admitting: Internal Medicine

## 2021-04-13 ENCOUNTER — Telehealth: Payer: Self-pay

## 2021-04-13 ENCOUNTER — Other Ambulatory Visit: Payer: Self-pay | Admitting: Endocrinology

## 2021-04-13 DIAGNOSIS — I5023 Acute on chronic systolic (congestive) heart failure: Secondary | ICD-10-CM | POA: Diagnosis not present

## 2021-04-13 LAB — CBC
HCT: 32.8 % — ABNORMAL LOW (ref 39.0–52.0)
Hemoglobin: 10.8 g/dL — ABNORMAL LOW (ref 13.0–17.0)
MCH: 29.9 pg (ref 26.0–34.0)
MCHC: 32.9 g/dL (ref 30.0–36.0)
MCV: 90.9 fL (ref 80.0–100.0)
Platelets: 213 10*3/uL (ref 150–400)
RBC: 3.61 MIL/uL — ABNORMAL LOW (ref 4.22–5.81)
RDW: 13 % (ref 11.5–15.5)
WBC: 8.2 10*3/uL (ref 4.0–10.5)
nRBC: 0 % (ref 0.0–0.2)

## 2021-04-13 LAB — BASIC METABOLIC PANEL
Anion gap: 12 (ref 5–15)
BUN: 11 mg/dL (ref 6–20)
CO2: 27 mmol/L (ref 22–32)
Calcium: 8.4 mg/dL — ABNORMAL LOW (ref 8.9–10.3)
Chloride: 103 mmol/L (ref 98–111)
Creatinine, Ser: 1.23 mg/dL (ref 0.61–1.24)
GFR, Estimated: >60 mL/min (ref >60)
Glucose, Bld: 195 mg/dL — ABNORMAL HIGH (ref 70–99)
Potassium: 3.4 mmol/L — ABNORMAL LOW (ref 3.5–5.1)
Sodium: 142 mmol/L (ref 135–145)

## 2021-04-13 LAB — GLUCOSE, CAPILLARY
Glucose-Capillary: 171 mg/dL — ABNORMAL HIGH (ref 70–99)
Glucose-Capillary: 169 mg/dL — ABNORMAL HIGH (ref 70–99)

## 2021-04-13 MED ORDER — CLOPIDOGREL BISULFATE 75 MG PO TABS
75.0000 mg | ORAL_TABLET | Freq: Every day | ORAL | 3 refills | Status: DC
  Filled 2021-04-13: qty 30, 30d supply, fill #0

## 2021-04-13 MED ORDER — SPIRONOLACTONE 25 MG PO TABS
25.0000 mg | ORAL_TABLET | Freq: Every day | ORAL | 3 refills | Status: DC
  Filled 2021-04-13: qty 30, 30d supply, fill #0

## 2021-04-13 MED ORDER — TORSEMIDE 20 MG PO TABS
20.0000 mg | ORAL_TABLET | Freq: Every day | ORAL | 3 refills | Status: DC | PRN
  Filled 2021-04-13: qty 30, 30d supply, fill #0

## 2021-04-13 MED ORDER — ROSUVASTATIN CALCIUM 20 MG PO TABS: 40.0000 mg | ORAL_TABLET | Freq: Every day | ORAL | Status: DC

## 2021-04-13 MED ORDER — POTASSIUM CHLORIDE CRYS ER 20 MEQ PO TBCR
40.0000 meq | EXTENDED_RELEASE_TABLET | Freq: Once | ORAL | Status: AC
  Administered 2021-04-13: 40 meq via ORAL
  Filled 2021-04-13: qty 2

## 2021-04-13 MED ORDER — CLOPIDOGREL BISULFATE 75 MG PO TABS
75.0000 mg | ORAL_TABLET | Freq: Every day | ORAL | Status: DC
  Administered 2021-04-13: 75 mg via ORAL
  Filled 2021-04-13: qty 1

## 2021-04-13 MED ORDER — POTASSIUM CHLORIDE CRYS ER 10 MEQ PO TBCR
10.0000 meq | EXTENDED_RELEASE_TABLET | Freq: Every day | ORAL | 0 refills | Status: DC | PRN
  Filled 2021-04-13: qty 30, 30d supply, fill #0

## 2021-04-13 MED ORDER — DAPAGLIFLOZIN PROPANEDIOL 10 MG PO TABS
10.0000 mg | ORAL_TABLET | Freq: Every day | ORAL | 3 refills | Status: DC
  Filled 2021-04-13: qty 30, 30d supply, fill #0

## 2021-04-13 MED ORDER — POTASSIUM CHLORIDE CRYS ER 20 MEQ PO TBCR
20.0000 meq | EXTENDED_RELEASE_TABLET | Freq: Once | ORAL | Status: AC
  Administered 2021-04-13: 20 meq via ORAL
  Filled 2021-04-13: qty 1

## 2021-04-13 MED ORDER — ROSUVASTATIN CALCIUM 40 MG PO TABS
40.0000 mg | ORAL_TABLET | Freq: Every day | ORAL | 3 refills | Status: DC
  Filled 2021-04-13: qty 30, 30d supply, fill #0

## 2021-04-13 MED ORDER — ASPIRIN 81 MG PO TBEC
81.0000 mg | DELAYED_RELEASE_TABLET | Freq: Every day | ORAL | 12 refills | Status: DC
  Filled 2021-04-13: qty 30, 30d supply, fill #0

## 2021-04-13 MED ORDER — POTASSIUM CHLORIDE CRYS ER 20 MEQ PO TBCR
20.0000 meq | EXTENDED_RELEASE_TABLET | Freq: Every day | ORAL | 3 refills | Status: DC | PRN
  Filled 2021-04-13: qty 30, 30d supply, fill #0

## 2021-04-13 MED ORDER — SACUBITRIL-VALSARTAN 49-51 MG PO TABS
1.0000 | ORAL_TABLET | Freq: Two times a day (BID) | ORAL | 3 refills | Status: DC
  Filled 2021-04-13: qty 60, 30d supply, fill #0

## 2021-04-13 NOTE — Discharge Summary (Addendum)
Physician Discharge Summary  Mitchell [name] TWS:[id number] DOB: March 07, 1971 DOA: 04/11/2021  PCP: [name], MD  Admit date: 04/11/2021 Discharge date: 04/13/2021  Admitted From: Home  Disposition:  Home   Recommendations for Outpatient Follow-up:  Follow up with Cardiology as directed Please obtain BMP in 1 week on Entresto and Honor: None  Equipment/Devices: None new  Discharge Condition: Good  CODE STATUS: FULL Diet recommendation: Diabetic, cardiac  Brief/Interim Summary: Mitchell [name] is a 50 y.o. M with sCHF EF 20-25%, obesity, DM, and CAD who presented with acute SOB and chest discomfort.  In the ER, got aspirin 325 and NTG.  SpO2 86% on room air, tachypneic to 31 and SOB. CXR with edema.  Started on Lasix and admitted initally for CHF and demand ischemia.  Subsequently, Hstrop noted to jump to >11,000, and so he was started on heparin.            PRINCIPAL HOSPITAL DIAGNOSIS: NSTEMI    Discharge Diagnoses:    NSTEMI Coronary disease Peripheral vascular disease Patient admitted and started on heparin.  Underwent LHC with diffuse disease, no specific targets.  Cardiology added Plavix.     Acute on chronic systolic CHF Admitted and diuresed 4.5L, orthopnea and dyspnea on exertion resolved.  Entresto and spironolactone and Farxiga added.  Torsemide prescribed on PRN basis at discharge.  Follow up arranged.  Acute respiratory failure with hypoxia due to CHF Patient initially with shortness of breath, RR up to 30s, SpO2 86% on room air, and pO2 64 while on supplemental O2.    Hypertension  Anemia of chronic disease In setting of CHF, Hgb no significant change  Type 2 Diabetes with polyneuropathy Farxiga added.   Morbid obesity BMI 38  Hypokalemia Hypomagnesemia Mag and K repleted.                  Discharge Instructions  Discharge Instructions     Amb Referral to Nutrition and Diabetic Education    Complete by: As directed    Diet - low sodium heart healthy   Complete by: As directed    Discharge instructions   Complete by: As directed    From Dr. Loleta Books: You were admitted for a small heart attack and a flare of your congestive heart failure.  FOr the heart attack: Take aspirin 81 mg daily CONTINUE your Crestor (cholesterol medicine), take this at night, helps with side effects START the new medicine clopidogrel/Plavix 75 mg daily (Plavix is like a "super" aspirin), take it until Dr. Missy Sabins tells you to stop   For your heart failure: We are adding some new medicines to ease the stress on your heart and make it function best: Take Entresto twice daily Take Spironolactone once daily Take Farxiga once daily (this will actually help with your blood sugars as well) Take torsemide 20 mg as needed for swelling When you take the torsemide, take the potassium  Go see Dr. Belenda Cruise office as directed Weigh yourself every day   Increase activity slowly   Complete by: As directed       Allergies as of 04/13/2021       Reactions   Nsaids Anaphylaxis, Other (See Comments)   Able to tolerate aspirin         Medication List     STOP taking these medications    acetaminophen 325 MG tablet Commonly known as: TYLENOL   docusate sodium 100 MG capsule Commonly known as: COLACE  doxycycline 100 MG tablet Commonly known as: VIBRA-TABS       TAKE these medications    Accu-Chek FastClix Lancet Kit Use to monitor glucose levels BID   Accu-Chek Guide Me w/Device Kit 1 each by Does not apply route 2 (two) times daily. Use to monitor glucose levels BID   Accu-Chek Guide test strip Generic drug: glucose blood 1 each by Other route in the morning and at bedtime. And lancets 2/day.   ascorbic acid 250 MG tablet Commonly known as: VITAMIN C Take 1 tablet (250 mg total) by mouth daily.   aspirin 81 MG EC tablet Take 1 tablet (81 mg total) by mouth daily.    carvedilol 6.25 MG tablet Commonly known as: COREG Take 1 tablet (6.25 mg total) by mouth 2 (two) times daily with a meal.   clopidogrel 75 MG tablet Commonly known as: PLAVIX Take 1 tablet (75 mg total) by mouth daily.   dapagliflozin propanediol 10 MG Tabs tablet Commonly known as: Farxiga Take 1 tablet (10 mg total) by mouth daily before breakfast.   gabapentin 100 MG capsule Commonly known as: NEURONTIN Take 1 capsule (100 mg total) by mouth at bedtime.   hydrocerin Crea Apply 1 application topically 2 (two) times daily. To left foot/leg   insulin glargine 100 UNIT/ML injection Commonly known as: LANTUS Inject 0.21 mLs (21 Units total) into the skin 2 (two) times daily. THIS IS THE SAME AS BASIGLAR--THE INSULIN IS DIVIDED INTO TWICE A DAY FOR BETTER COVERAGE.   Lancets 28G Misc Use for glucose testing up to BID   methocarbamol 500 MG tablet Commonly known as: ROBAXIN Take 1 tablet (500 mg total) by mouth every 6 (six) hours as needed for muscle spasms.   multivitamin with minerals Tabs tablet Take 1 tablet by mouth daily.   nutrition supplement (JUVEN) Pack Take 1 packet by mouth 2 (two) times daily between meals.   oxyCODONE-acetaminophen 7.5-325 MG tablet Commonly known as: PERCOCET Take 1 tablet by mouth every 8 (eight) hours as needed for severe pain.   pantoprazole 40 MG tablet Commonly known as: PROTONIX Take 1 tablet (40 mg total) by mouth daily.   polyethylene glycol 17 g packet Commonly known as: MIRALAX / GLYCOLAX Take 17 g by mouth daily.   potassium chloride 10 MEQ tablet Commonly known as: KLOR-CON Take 1 tablet (10 mEq total) by mouth daily as needed. Take potassium if you take torsemide   rosuvastatin 20 MG tablet Commonly known as: Crestor Take 1 tablet (20 mg total) by mouth daily.   sacubitril-valsartan 49-51 MG Commonly known as: ENTRESTO Take 1 tablet by mouth 2 (two) times daily.   senna 8.6 MG Tabs tablet Commonly known as:  SENOKOT Take 2 tablets (17.2 mg total) by mouth at bedtime.   simethicone 80 MG chewable tablet Commonly known as: MYLICON Chew 1 tablet (80 mg total) by mouth 4 (four) times daily.   spironolactone 25 MG tablet Commonly known as: ALDACTONE Take 1 tablet (25 mg total) by mouth daily. Start taking on: April 14, 2021   torsemide 20 MG tablet Commonly known as: DEMADEX Take 1 tablet (20 mg total) by mouth daily as needed (For swelling). What changed:  medication strength how much to take when to take this reasons to take this        Follow-up Information     [place] Follow up on 04/20/2021.   Specialty: Cardiology Why: [facility] at [facility]  [facility] at 9 am [place], [place] [address] Contact information: [address] [id number] [place] [address] [phone]               Allergies  Allergen Reactions   Nsaids Anaphylaxis and Other (See Comments)    Able to tolerate aspirin     Consultations: Cardiology   Procedures/Studies: DG Chest 2 View  Result Date: 04/11/2021 CLINICAL DATA:  Chest pain and shortness of breath. EXAM: CHEST - 2 VIEW COMPARISON:  01/26/2021 FINDINGS: Low volume film. The cardio pericardial silhouette is enlarged. Diffuse interstitial and basilar airspace opacity suggests edema. The visualized bony structures of the thorax show no acute abnormality. IMPRESSION: Low volume film with cardiomegaly and pulmonary edema. Electronically Signed   By: Misty Stanley M.D.   On: 04/11/2021 07:34   CARDIAC CATHETERIZATION  Result Date: 04/13/2021   Prox LAD to Mid LAD lesion is 80% stenosed.   Dist LAD lesion is 99% stenosed.   Dist RCA lesion is 90% stenosed.   Ost 1st Mrg lesion is 95% stenosed.   1st Mrg lesion is 99% stenosed.   Ost 2nd Mrg to 2nd Mrg lesion is 99% stenosed.   Ost RPDA to RPDA lesion is 90% stenosed.   RPAV lesion is 95% stenosed.   Mid LM  lesion is 20% stenosed. Findings: Ao = 131/84 (107) LV = 131/22 RA = 4 RV = 49/11 PA = 49/17 (31) PCW = 15 Fick cardiac output/index = 6.7/2.7 PVR = 2.4 FA sat = 95% PA sat = 56%, 63% Assessment: Severe 3v diffuse CAD with moderate progression from previous Ischemic CM EF 25% Mild pulmonary venous HTN with preserved cardiac output Plan/Discussion: Films reviewed with interventional team. He has diffuse severe CAD. No clear culprit lesion and no favorable lesion for intervention. Continue medical therapy. Volume status improved. Cardiac output preserved. Glori Bickers, MD 8:06 AM     Subjective: Feeling well.  No chest pain, orthopnea, swelling.  No fever.  Discharge Exam: Vitals:   04/13/21 0456 04/13/21 1004  BP: 118/82 123/79  Pulse: 78 82  Resp: 16 18  Temp: 98.7 F (37.1 C) 98.7 F (37.1 C)  SpO2: 98% 98%   Vitals:   04/12/21 1918 04/12/21 2150 04/13/21 0456 04/13/21 1004  BP: 113/78 126/86 118/82 123/79  Pulse: 85  78 82  Resp: 18  16 18   Temp: 98.9 F (37.2 C)  98.7 F (37.1 C) 98.7 F (37.1 C)  TempSrc: Oral  Oral Oral  SpO2: 99% 99% 98% 98%  Weight:   128.3 kg   Height:        General: Pt is alert, awake, not in acute distress Cardiovascular: RRR, nl S1-S2, no murmurs appreciated.   No LE edema.   Respiratory: Normal respiratory rate and rhythm.  CTAB without rales or wheezes. Abdominal: Abdomen soft and non-tender.  No distension or HSM.   MSK: Right BKA Neuro/Psych: Upper extremities strong, apears normal movement, face symmetric, speech fluent.  Judgment and insight appear normal.   The results of significant diagnostics from this hospitalization (including imaging, microbiology, ancillary and laboratory) are listed below for reference.     Microbiology: Recent Results (from the past 240 hour(s))  Resp Panel by RT-PCR (Flu A&B, Covid) Nasopharyngeal Swab     Status: None   Collection Time: 04/11/21  7:15 AM   Specimen: Nasopharyngeal Swab;  Nasopharyngeal(NP) swabs in vial transport medium  Result Value Ref Range Status   SARS Coronavirus 2 by RT  PCR NEGATIVE NEGATIVE Final    Comment: (NOTE) SARS-CoV-2 target nucleic acids are NOT DETECTED.  The SARS-CoV-2 RNA is generally detectable in upper respiratory specimens during the acute phase of infection. The lowest concentration of SARS-CoV-2 viral copies this assay can detect is 138 copies/mL. A negative result does not preclude SARS-Cov-2 infection and should not be used as the sole basis for treatment or other patient management decisions. A negative result may occur with  improper specimen collection/handling, submission of specimen other than nasopharyngeal swab, presence of viral mutation(s) within the areas targeted by this assay, and inadequate number of viral copies(<138 copies/mL). A negative result must be combined with clinical observations, patient history, and epidemiological information. The expected result is Negative.  Fact Sheet for Patients:  EntrepreneurPulse.com.au  Fact Sheet for Healthcare Providers:  IncredibleEmployment.be  This test is no t yet approved or cleared by the [place] FDA and  has been authorized for detection and/or diagnosis of SARS-CoV-2 by FDA under an Emergency Use Authorization (EUA). This EUA will remain  in effect (meaning this test can be used) for the duration of the COVID-19 declaration under Section 564(b)(1) of the Act, 21 U.S.C.section 360bbb-3(b)(1), unless the authorization is terminated  or revoked sooner.       Influenza A by PCR NEGATIVE NEGATIVE Final   Influenza B by PCR NEGATIVE NEGATIVE Final    Comment: (NOTE) The Xpert Xpress SARS-CoV-2/FLU/RSV plus assay is intended as an aid in the diagnosis of influenza from Nasopharyngeal swab specimens and should not be used as a sole basis for treatment. Nasal washings and aspirates are unacceptable for Xpert Xpress  SARS-CoV-2/FLU/RSV testing.  Fact Sheet for Patients: EntrepreneurPulse.com.au  Fact Sheet for Healthcare Providers: IncredibleEmployment.be  This test is not yet approved or cleared by the [place] FDA and has been authorized for detection and/or diagnosis of SARS-CoV-2 by FDA under an Emergency Use Authorization (EUA). This EUA will remain in effect (meaning this test can be used) for the duration of the COVID-19 declaration under Section 564(b)(1) of the Act, 21 U.S.C. section 360bbb-3(b)(1), unless the authorization is terminated or revoked.  Performed at [facility], [place] [address], [place], [place] [address]      Labs: BNP (last 3 results) Recent Labs    02/04/21 0021 02/05/21 0132 04/11/21 0751  BNP 551.9* 421.9* 801.6*   Basic Metabolic Panel: Recent Labs  Lab 04/11/21 0751 04/11/21 1659 04/12/21 0647 04/12/21 1349 04/12/21 1356 04/12/21 1401 04/12/21 1506 04/13/21 0324  NA 140 141 143 145 146* 145 141 142  K 2.8* 3.6 2.9* 3.2* 3.0* 3.3* 3.4* 3.4*  CL 103 104 104  --   --   --  102 103  CO2 26 28 31   --   --   --  32 27  GLUCOSE 276* 187* 77  --   --   --  108* 195*  BUN 13 12 13   --   --   --  10 11  CREATININE 1.35* 1.20 1.20  --   --   --  1.16 1.23  CALCIUM 8.6* 8.5* 8.5*  --   --   --  8.5* 8.4*  MG  --   --  1.5*  --   --   --   --   --    Liver Function Tests: Recent Labs  Lab 04/11/21 0751  AST 20  ALT 11  ALKPHOS 69  BILITOT 0.9  PROT 6.3*  ALBUMIN 2.9*   No  results for input(s): LIPASE, AMYLASE in the last 168 hours. No results for input(s): AMMONIA in the last 168 hours. CBC: Recent Labs  Lab 04/11/21 0751 04/12/21 0647 04/12/21 1349 04/12/21 1356 04/12/21 1401 04/12/21 1506 04/13/21 0324  WBC 10.8* 9.8  --   --   --  9.3 8.2  NEUTROABS 9.2*  --   --   --   --   --   --   HGB 11.5* 11.2* 11.2* 10.9* 11.6* 11.6* 10.8*  HCT 35.7* 33.8* 33.0* 32.0* 34.0* 36.3* 32.8*  MCV 92.2  89.7  --   --   --  90.8 90.9  PLT 186 198  --   --   --  235 213   Cardiac Enzymes: No results for input(s): CKTOTAL, CKMB, CKMBINDEX, TROPONINI in the last 168 hours. BNP: Invalid input(s): POCBNP CBG: Recent Labs  Lab 04/12/21 0608 04/12/21 1116 04/12/21 1613 04/12/21 2107 04/13/21 0608  GLUCAP 80 92 133* 173* 171*   D-Dimer No results for input(s): DDIMER in the last 72 hours. Hgb A1c Recent Labs    04/11/21 0953  HGBA1C 7.8*   Lipid Profile Recent Labs    04/12/21 0647  CHOL 217*  HDL 30*  LDLCALC 166*  TRIG 107  CHOLHDL 7.2   Thyroid function studies No results for input(s): TSH, T4TOTAL, T3FREE, THYROIDAB in the last 72 hours.  Invalid input(s): FREET3 Anemia work up No results for input(s): VITAMINB12, FOLATE, FERRITIN, TIBC, IRON, RETICCTPCT in the last 72 hours. Urinalysis    Component Value Date/Time   COLORURINE AMBER (A) 01/24/2021 0339   APPEARANCEUR TURBID (A) 01/24/2021 0339   LABSPEC 1.023 01/24/2021 0339   PHURINE 5.0 01/24/2021 0339   GLUCOSEU 50 (A) 01/24/2021 0339   HGBUR NEGATIVE 01/24/2021 0339   BILIRUBINUR NEGATIVE 01/24/2021 0339   KETONESUR NEGATIVE 01/24/2021 0339   PROTEINUR 100 (A) 01/24/2021 0339   UROBILINOGEN 0.2 12/08/2014 2355   NITRITE NEGATIVE 01/24/2021 0339   LEUKOCYTESUR NEGATIVE 01/24/2021 0339   Sepsis Labs Invalid input(s): PROCALCITONIN,  WBC,  LACTICIDVEN Microbiology Recent Results (from the past 240 hour(s))  Resp Panel by RT-PCR (Flu A&B, Covid) Nasopharyngeal Swab     Status: None   Collection Time: 04/11/21  7:15 AM   Specimen: Nasopharyngeal Swab; Nasopharyngeal(NP) swabs in vial transport medium  Result Value Ref Range Status   SARS Coronavirus 2 by RT PCR NEGATIVE NEGATIVE Final    Comment: (NOTE) SARS-CoV-2 target nucleic acids are NOT DETECTED.  The SARS-CoV-2 RNA is generally detectable in upper respiratory specimens during the acute phase of infection. The lowest concentration of SARS-CoV-2  viral copies this assay can detect is 138 copies/mL. A negative result does not preclude SARS-Cov-2 infection and should not be used as the sole basis for treatment or other patient management decisions. A negative result may occur with  improper specimen collection/handling, submission of specimen other than nasopharyngeal swab, presence of viral mutation(s) within the areas targeted by this assay, and inadequate number of viral copies(<138 copies/mL). A negative result must be combined with clinical observations, patient history, and epidemiological information. The expected result is Negative.  Fact Sheet for Patients:  EntrepreneurPulse.com.au  Fact Sheet for Healthcare Providers:  IncredibleEmployment.be  This test is no t yet approved or cleared by the [place] FDA and  has been authorized for detection and/or diagnosis of SARS-CoV-2 by FDA under an Emergency Use Authorization (EUA). This EUA will remain  in effect (meaning this test can be used) for the duration of  the COVID-19 declaration under Section 564(b)(1) of the Act, 21 U.S.C.section 360bbb-3(b)(1), unless the authorization is terminated  or revoked sooner.       Influenza A by PCR NEGATIVE NEGATIVE Final   Influenza B by PCR NEGATIVE NEGATIVE Final    Comment: (NOTE) The Xpert Xpress SARS-CoV-2/FLU/RSV plus assay is intended as an aid in the diagnosis of influenza from Nasopharyngeal swab specimens and should not be used as a sole basis for treatment. Nasal washings and aspirates are unacceptable for Xpert Xpress SARS-CoV-2/FLU/RSV testing.  Fact Sheet for Patients: EntrepreneurPulse.com.au  Fact Sheet for Healthcare Providers: IncredibleEmployment.be  This test is not yet approved or cleared by the [place] FDA and has been authorized for detection and/or diagnosis of SARS-CoV-2 by FDA under an Emergency Use Authorization  (EUA). This EUA will remain in effect (meaning this test can be used) for the duration of the COVID-19 declaration under Section 564(b)(1) of the Act, 21 U.S.C. section 360bbb-3(b)(1), unless the authorization is terminated or revoked.  Performed at [facility], [place] [address] [place], [place] [address]      Time coordinating discharge: 25 minutes The [place] controlled substances registry was reviewed for this patient       30 Day Unplanned Readmission Risk Score    Flowsheet Row ED to Hosp-Admission (Current) from 04/11/2021 in [place] [facility]  30 Day Unplanned Readmission Risk Score (%) 23.61 Filed at 04/13/2021 0801       This score is the patient's risk of an unplanned readmission within 30 days of being discharged (0 -100%). The score is based on dignosis, age, lab data, medications, orders, and past utilization.   Low:  0-14.9   Medium: 15-21.9   High: 22-29.9   Extreme: 30 and above            SIGNED:   Edwin Dada, MD  [facility] 04/13/2021, 11:36 AM

## 2021-04-13 NOTE — Plan of Care (Signed)

## 2021-04-13 NOTE — Evaluation (Signed)
Physical Therapy Evaluation Patient Details Name: Mitchell [name] MRN: [id number] DOB: 04-18-1971 Today's Date: 04/13/2021  History of Present Illness  Pt is a 50 y.o. male who was admitted 04/11/21 with acute on chronic HFrEF and NSTEMI. S/p R/L heart cath and coronary angiography 9/13. PMH: HFrEF, ischemic cardiomyopathy, CAD, hypertension, hyperlipidemia, type 2 diabetes, PVD s/p right BKA, COVID-19 in 2022 and prior CVA   Clinical Impression  Pt presents with condition above and deficits mentioned below, see PT Problem List. PTA, he was living with his wife in a 1-level apartment with 2 STE. He has been mod I using a w/c for mobility and needing assistance from his daughters on the stairs. Pt currently needs up to minA for transfers and min guard assist to hop a couple times to transfer between surfaces using a RW. He displays deficits in balance, activity tolerance, and strength. His plan is to get a prosthesis very soon, recommending follow-up with HHPT to maximize his safety and independence with functional mobility once he gets his prosthesis. Will continue to follow acutely.       Recommendations for follow up therapy are one component of a multi-disciplinary discharge planning process, led by the attending physician.  Recommendations may be updated based on patient status, additional functional criteria and insurance authorization.  Follow Up Recommendations Home health PT;Supervision for mobility/OOB    Equipment Recommendations  None recommended by PT    Recommendations for Other Services       Precautions / Restrictions Precautions Precautions: Fall Precaution Comments: recent R BKA Restrictions Weight Bearing Restrictions: No      Mobility  Bed Mobility Overal bed mobility: Modified Independent             General bed mobility comments: Pt able to transition supine > sit EOB with HOB elevated with mod I.    Transfers Overall transfer level: Needs  assistance Equipment used: Rolling walker (2 wheeled) Transfers: Sit to/from [employer] Sit to Stand: Min assist Stand pivot transfers: Min guard       General transfer comment: MinA to steady wityh transfer to stand, cuing for hand placement. Min guard for stand and hop to transfer to L bed > recliner.  Ambulation/Gait Ambulation/Gait assistance: Min guard Gait Distance (Feet): 2 Feet Assistive device: Rolling walker (2 wheeled) Gait Pattern/deviations:  (hop-to) Gait velocity: reduced Gait velocity interpretation: <1.31 ft/sec, indicative of household ambulator General Gait Details: Pt hopping between bed > recliner with RW, min guard for safety, fatigues quickly, but no LOB.  Stairs            Wheelchair Mobility    Modified Rankin (Stroke Patients Only)       Balance Overall balance assessment: Needs assistance Sitting-balance support: No upper extremity supported;Feet supported Sitting balance-Leahy Scale: Good     Standing balance support: Bilateral upper extremity supported;During functional activity Standing balance-Leahy Scale: Poor Standing balance comment: Reliant on UE support.                             Pertinent Vitals/Pain Pain Assessment: Faces Faces Pain Scale: No hurt Pain Intervention(s): Monitored during session    Home Living Family/patient expects to be discharged to:: Private residence Living Arrangements: Spouse/significant other;Children (2 daughters, 52 y.o. and 64 y.o.) Available Help at Discharge: Family;Available PRN/intermittently Type of Home: Apartment Home Access: Stairs to enter Entrance Stairs-Rails: Right;Left Entrance Stairs-Number of Steps: 2 Home Layout: One level Home Equipment: Shower  seat;Cane - single point;Hand held shower head;Toilet riser;Wheelchair - [employer] - 2 wheels;Walker - 4 wheels Additional Comments: Wife is in w/c also, daughters assist with both pt and wife when off  work    Prior Function Level of Independence: Needs assistance   Gait / Transfers Assistance Needed: Plan is for prosthesis soon. Has been using a w/c primarily for mobility. Has begun to try to hop with a RW with PT. Daughters help with stairs.  ADL's / Homemaking Assistance Needed: Independent with dressing and bathing. Daughters do the cooking and cleaning.  Comments: Pt drives.     Hand Dominance   Dominant Hand: Right    Extremity/Trunk Assessment   Upper Extremity Assessment Upper Extremity Assessment: Overall WFL for tasks assessed    Lower Extremity Assessment Lower Extremity Assessment: Generalized weakness;RLE deficits/detail RLE Deficits / Details: R BKA 01/25/2021    Cervical / Trunk Assessment Cervical / Trunk Assessment: Normal  Communication   Communication: No difficulties  Cognition Arousal/Alertness: Awake/alert Behavior During Therapy: WFL for tasks assessed/performed Overall Cognitive Status: Within Functional Limits for tasks assessed                                        General Comments General comments (skin integrity, edema, etc.): HR 80s at rest; pt educated on CHF booklet    Exercises     Assessment/Plan    PT Assessment Patient needs continued PT services  PT Problem List Decreased strength;Decreased activity tolerance;Decreased balance;Decreased mobility;Cardiopulmonary status limiting activity       PT Treatment Interventions DME instruction;Gait training;Stair training;Functional mobility training;Therapeutic activities;Therapeutic exercise;Balance training;Neuromuscular re-education;Patient/family education;Wheelchair mobility training    PT Goals (Current goals can be found in the Care Plan section)  Acute Rehab PT Goals Patient Stated Goal: to go home and get his prosthesis PT Goal Formulation: With patient Time For Goal Achievement: 04/27/21 Potential to Achieve Goals: Good    Frequency Min 3X/week    Barriers to discharge        Co-evaluation               AM-PAC PT "6 Clicks" Mobility  Outcome Measure Help needed turning from your back to your side while in a flat bed without using bedrails?: None Help needed moving from lying on your back to sitting on the side of a flat bed without using bedrails?: None Help needed moving to and from a bed to a chair (including a wheelchair)?: A Little Help needed standing up from a chair using your arms (e.g., wheelchair or bedside chair)?: A Little Help needed to walk in hospital room?: A Little Help needed climbing 3-5 steps with a railing? : A Lot 6 Click Score: 19    End of Session Equipment Utilized During Treatment: Gait belt Activity Tolerance: Patient tolerated treatment well Patient left: in chair;with call bell/phone within reach;with chair alarm set   PT Visit Diagnosis: Unsteadiness on feet (R26.81);Other abnormalities of gait and mobility (R26.89);Muscle weakness (generalized) (M62.81);History of falling (Z91.81);Difficulty in walking, not elsewhere classified (R26.2)    Time: 3664-4034 PT Time Calculation (min) (ACUTE ONLY): 41 min   Charges:   PT Evaluation $PT Eval Moderate Complexity: 1 Mod PT Treatments $Therapeutic Activity: 23-37 mins        Raymond Gurney, PT, DPT Acute Rehabilitation Services  Pager: [phone] Office: [phone]   Jewel Baize 04/13/2021, 11:57 AM

## 2021-04-13 NOTE — TOC CM/SW Note (Addendum)
Unit RN will check pt oxygen saturation to ensure oxygen not needed for home. Pt states he will get his prosthesis for his leg. Had an appt but was in hospital. HF TOC CM spoke to [facility] pharmacy. They will bill pt for $40 copay. Isidoro Donning RN3 CCM, Heart Failure TOC CM [phone]

## 2021-04-13 NOTE — Telephone Encounter (Signed)
   JAAZIEL PEATROSS DOB: [birth year] MRN: [id number]   RIDER WAIVER AND RELEASE OF LIABILITY  For purposes of improving physical access to our facilities, [facility] is pleased to partner with third parties to provide [facility] patients or other authorized individuals the option of convenient, on-demand ground transportation services (the Chiropractor") through use of the technology service that enables users to request on-demand ground transportation from independent third-party providers.  By opting to use and accept these [employer], I, the undersigned, hereby agree on behalf of myself, and on behalf of any minor child using the Science writer for whom I am the parent or legal guardian, as follows:  Science writer provided to me are provided by independent third-party transportation providers who are not [employer] or employees and who are unaffiliated with [employer]. [facility] is neither a transportation carrier nor a common or public carrier. [facility] has no control over the quality or safety of the transportation that occurs as a result of the [employer]. [facility] cannot guarantee that any third-party transportation provider will complete any arranged transportation service. [facility] makes no representation, warranty, or guarantee regarding the reliability, timeliness, quality, safety, suitability, or availability of any of the Transport Services or that they will be error free. I fully understand that traveling by vehicle involves risks and dangers of serious bodily injury, including permanent disability, paralysis, and death. I agree, on behalf of myself and on behalf of any minor child using the Transport Services for whom I am the parent or legal guardian, that the entire risk arising out of my use of the [employer] remains solely with me, to the maximum extent permitted under applicable law. The [employer] are provided "as  is" and "as available." [facility] disclaims all representations and warranties, express, implied or statutory, not expressly set out in these terms, including the implied warranties of merchantability and fitness for a particular purpose. I hereby waive and release [facility], its agents, employees, officers, directors, representatives, insurers, attorneys, assigns, successors, subsidiaries, and affiliates from any and all past, present, or future claims, demands, liabilities, actions, causes of action, or suits of any kind directly or indirectly arising from acceptance and use of the [employer]. I further waive and release  and its affiliates from all present and future liability and responsibility for any injury or death to persons or damages to property caused by or related to the use of the [employer]. I have read this Waiver and Release of Liability, and I understand the terms used in it and their legal significance. This Waiver is freely and voluntarily given with the understanding that my right (as well as the right of any minor child for whom I am the parent or legal guardian using the [employer]) to legal recourse against  in connection with the [employer] is knowingly surrendered in return for use of these services.   I attest that I read the consent document to [name], gave Mr. Gunn the opportunity to ask questions and answered the questions asked (if any). I affirm that [name] then provided consent for he's participation in this program.     Farley Ly

## 2021-04-13 NOTE — Progress Notes (Signed)
Spot check o2 is 98% on room air.

## 2021-04-13 NOTE — Progress Notes (Signed)
Heart and Vascular Care Navigation  04/13/2021  Mitchell [name] 1971-07-11 [id number]  Reason for Referral: Outpatient Heart Failure CSW consulted to enroll pt in Paramedicine program through [place] [facility] with patient face to face for initial visit for Heart and Vascular Care Coordination.                                                                                                   Assessment:   CSW met with pt to discuss enrollment in [employer].  Pt is agreeable to enrollment and feels as if this will help him manage needs at home.  Lives at home with wife and adult dtr, Mitchell [name], who helps take care of him and his wife.  Pt requires a wheelchair after recent [place] in June and his wife is also wheelchair bound- their dtr Mitchell [name] acts as a caregiver for both of them when needed.   Paramedicine Initial Assessment:  Income:  What is your current source of income? Pt depends on wife who is working with [employer] to pay for housing expenses.  States they are able to cover everything they need but he is concerned it is tight to do so- wife doesn't share financial information with him so he is unsure how tight it is.  Pt applied for disability in June or July and is working with a Chief Executive Officer on this process- was working in a warehouse prior to amputation and has been told he would be unsafe to return to work.  Insurance:  Are you currently insured? UHC  Do you have prescription coverage? yes   Daily Health Needs: Do you have a working scale at home? Yes but can't use currently due to inability to stand on scale with BKA.  Getting prosthetic soon so should be able to use normal scale in the coming weeks.  How do you manage your medications at home? Struggling to do so- has poor eyesight due to cataracts which makes it hard to know what he is taking.  Do you ever take your medications differently than prescribed? Yes- sometimes can't afford and stops  taking the medications.  Do you have any concerns with mobility at home? Uses a wheelchair at home but not fully independent with this- fell trying to get into bed recently.  Speaking with apt about getting doors taken off of hinges so house will be more accessible.  Do you use any assistive devices at home or have PCS at home? Uses wheelchair and working on getting leg prothesis   Do you have a PCP? yes  Do you have any trouble reading or writing? Trouble reading with cataracts   Are there any additional barriers you see to getting the care you need? Struggling with some mental health and emotional concerns.  Was seeing [facility] but stopped awhile ago- would like to get back involved with them for mental health care.  Also states he is having problems at home not feeling appreciated or connecting with family.  Feeling like him and his wife are communicating well and that she is frustrated  with him because he can no longer work and she is supporting the family- CSW provided active listening and support.  Pt very involved in religion and has support through his church but struggling with these emotions and knowing what to do.                                    HRT/VAS Care Coordination     Outpatient Care Team [facility]; Social Worker   Clinical biochemist Name: enrolled 04/13/21   Social Worker Name: [facility]   Living arrangements for the past 2 months Apartment   Lives with: Adult Children; Spouse   Patient Current Librarian, academic   Patient Has Concern With Paying Medical Bills No   Does Patient Have Prescription Coverage? Yes   Home Assistive Devices/Equipment None   DME Agency NA   HH Agency [facility]   Current home services DME  rolling walker, wheelchair       Social History:                                                                             SDOH Screenings   Alcohol Screen:  Not on file  Depression (PHQ2-9): Not on file  Financial Resource Strain: Medium Risk   Difficulty of Paying Living Expenses: Somewhat hard  Food Insecurity: No Food Insecurity   Worried About Running Out of Food in the Last Year: Never true   Ran Out of Food in the Last Year: Never true  Housing: Low Risk    Last Housing Risk Score: 0  Physical Activity: Not on file  Social Connections: Not on file  Stress: Not on file  Tobacco Use: Low Risk    Smoking Tobacco Use: Never   Smokeless Tobacco Use: Never  Transportation Needs: No Transportation Needs   Lack of Transportation (Medical): No   Lack of Transportation (Non-Medical): No    SDOH Interventions: Financial Resources:  Financial Strain Interventions:  (referred to pharmacy for assistance with medication cost concerns)   Food Insecurity:  Food Insecurity Interventions: Intervention Not Indicated  Housing Insecurity:  Housing Interventions: Intervention Not Indicated  Transportation:   Transportation Interventions: [employer]  Reports that he is concerned with getting rides in the future cause dependent on dtrs one of whom is in school and the other who is trying to work    Follow-up plan:    CSW sent in referral to [facility] to help pt be able to get to and from appts- pt provided their number and informed how to set up ride.  Pt to work on re-establishing mental health follow up with [facility] to help him with current stressors and concerns.  Paramedicine to reach out to pt to set up initial visit once DCed from the hospital  Outpatient Heart Failure CSW will continue to follow through paramedicine program.  Jorge Ny, [place] Worker [facility] Desk#: [phone] Cell#: [phone]

## 2021-04-13 NOTE — Progress Notes (Signed)
Advanced Heart Failure Rounding Note  PCP-Cardiologist: Arvilla Meres, MD   Subjective:    R/L cath yesterday with severe 3v CAD (progressed from previous) but no obvious culprit lesion. Plan for medical therapy. Volume status and output ok.   Feel good. No CP or SOB. No orthopnea or PND.  Tearful this am regarding struggles with his wife and mother-in-law.   Wants to go home    Objective:   Weight Range: 128.3 kg Body mass index is 38.9 kg/m.   Vital Signs:   Temp:  [98 F (36.7 C)-98.9 F (37.2 C)] 98.7 F (37.1 C) (09/14 0456) Pulse Rate:  [77-113] 78 (09/14 0456) Resp:  [12-39] 16 (09/14 0456) BP: (113-152)/(64-100) 123/79 (09/14 1004) SpO2:  [0 %-100 %] 98 % (09/14 0456) Weight:  [128.3 kg] 128.3 kg (09/14 0456) Last BM Date: 04/11/21  Weight change: Filed Weights   04/11/21 1805 04/12/21 0512 04/13/21 0456  Weight: 128 kg 126.5 kg 128.3 kg    Intake/Output:   Intake/Output Summary (Last 24 hours) at 04/13/2021 1059 Last data filed at 04/13/2021 0900 Gross per 24 hour  Intake 1060 ml  Output 1450 ml  Net -390 ml      Physical Exam    General:  Well appearing. No resp difficulty HEENT: normal Neck: supple. no JVD. Carotids 2+ bilat; no bruits. No lymphadenopathy or thryomegaly appreciated. Cor: PMI nondisplaced. Regular rate & rhythm. No rubs, gallops or murmurs. Lungs: clear Abdomen: soft, nontender, nondistended. No hepatosplenomegaly. No bruits or masses. Good bowel sounds. Extremities: no cyanosis, clubbing, rash, edema R BKA Neuro: alert & orientedx3, cranial nerves grossly intact. moves all 4 extremities w/o difficulty. Affect pleasant   Telemetry   SR 70s.  Personally reviewed    Labs    CBC Recent Labs    04/11/21 0751 04/12/21 0647 04/12/21 1506 04/13/21 0324  WBC 10.8*   < > 9.3 8.2  NEUTROABS 9.2*  --   --   --   HGB 11.5*   < > 11.6* 10.8*  HCT 35.7*   < > 36.3* 32.8*  MCV 92.2   < > 90.8 90.9  PLT 186   < > 235  213   < > = values in this interval not displayed.   Basic Metabolic Panel Recent Labs    94/49/67 0647 04/12/21 1349 04/12/21 1506 04/13/21 0324  NA 143   < > 141 142  K 2.9*   < > 3.4* 3.4*  CL 104  --  102 103  CO2 31  --  32 27  GLUCOSE 77  --  108* 195*  BUN 13  --  10 11  CREATININE 1.20  --  1.16 1.23  CALCIUM 8.5*  --  8.5* 8.4*  MG 1.5*  --   --   --    < > = values in this interval not displayed.   Liver Function Tests Recent Labs    04/11/21 0751  AST 20  ALT 11  ALKPHOS 69  BILITOT 0.9  PROT 6.3*  ALBUMIN 2.9*   No results for input(s): LIPASE, AMYLASE in the last 72 hours. Cardiac Enzymes No results for input(s): CKTOTAL, CKMB, CKMBINDEX, TROPONINI in the last 72 hours.  BNP: BNP (last 3 results) Recent Labs    02/04/21 0021 02/05/21 0132 04/11/21 0751  BNP 551.9* 421.9* 612.1*    ProBNP (last 3 results) No results for input(s): PROBNP in the last 8760 hours.   D-Dimer No results for input(s): DDIMER  in the last 72 hours. Hemoglobin A1C Recent Labs    04/11/21 0953  HGBA1C 7.8*   Fasting Lipid Panel Recent Labs    04/12/21 0647  CHOL 217*  HDL 30*  LDLCALC 166*  TRIG 107  CHOLHDL 7.2   Thyroid Function Tests No results for input(s): TSH, T4TOTAL, T3FREE, THYROIDAB in the last 72 hours.  Invalid input(s): FREET3  Other results:   Imaging    CARDIAC CATHETERIZATION  Result Date: 04/13/2021   Prox LAD to Mid LAD lesion is 80% stenosed.   Dist LAD lesion is 99% stenosed.   Dist RCA lesion is 90% stenosed.   Ost 1st Mrg lesion is 95% stenosed.   1st Mrg lesion is 99% stenosed.   Ost 2nd Mrg to 2nd Mrg lesion is 99% stenosed.   Ost RPDA to RPDA lesion is 90% stenosed.   RPAV lesion is 95% stenosed.   Mid LM lesion is 20% stenosed. Findings: Ao = 131/84 (107) LV = 131/22 RA = 4 RV = 49/11 PA = 49/17 (31) PCW = 15 Fick cardiac output/index = 6.7/2.7 PVR = 2.4 FA sat = 95% PA sat = 56%, 63% Assessment: Severe 3v diffuse CAD with  moderate progression from previous Ischemic CM EF 25% Mild pulmonary venous HTN with preserved cardiac output Plan/Discussion: Films reviewed with interventional team. He has diffuse severe CAD. No clear culprit lesion and no favorable lesion for intervention. Continue medical therapy. Volume status improved. Cardiac output preserved. Arvilla Meres, MD 8:06 AM    Medications:     Scheduled Medications:  aspirin EC  81 mg Oral Daily   carvedilol  6.25 mg Oral BID WC   enoxaparin (LOVENOX) injection  40 mg Subcutaneous Q24H   gabapentin  100 mg Oral QHS   insulin aspart  0-9 Units Subcutaneous TID WC   insulin glargine-yfgn  20 Units Subcutaneous BID   pantoprazole  40 mg Oral Daily   potassium chloride  40 mEq Oral Once   rosuvastatin  20 mg Oral Daily   sacubitril-valsartan  1 tablet Oral BID   sodium chloride flush  3 mL Intravenous Q12H   sodium chloride flush  3 mL Intravenous Q12H   spironolactone  25 mg Oral Daily   torsemide  20 mg Oral Daily    Infusions:  sodium chloride     sodium chloride      PRN Medications: sodium chloride, sodium chloride, acetaminophen, methocarbamol, sodium chloride flush, sodium chloride flush, zolpidem    Patient Profile   The patient is a 50 y/o [place] American male with a medical history significant for HFrEF, ischemic cardiomyopathy, CAD, hypertension, hyperlipidemia, type 2 diabetes, PVD s/p right BKA, COVID-19 in 2022 and prior CVA who presented to the [facility] ED via EMS for evaluation of shortness of breath and chest pain. He is admitted for management of acute on chronic HFrEF and an NSTEMI.   Assessment/Plan   Acute on chronic HFrEF/Ischemic cardiomyopathy  - EF 20 to 25% on echo in June  - much improved volume status ok on cath    2. CAD/NSTEMI  - High-sensitivity troponins elevated to 70 --> 529 --> 8567 --> 11525.  Initial elevation believed to be secondary to demand ischemia/strain from pulmonary edema, but significant  elevation in hsT concerning for ACS; however, he is now asymptomatic  - EKG with no signs of acute ischemia - Cath with progressive 3v CAD as above. No targets for intervention  - Continue aspirin, statin and beta blocker. Add plavix  3. Hypokalemia/Hypomagnesemia - K of 2.8 on admission -- repleted with improvement to 3.6, but is now 2.9 this AM  - Replete aggressively to maintain K of >4 (and magnesium of 2) - Spironolactone started at 25 mg daily  - continue to follow   4. Sinus tachycardia - Resolved -- suspect r/t volume overload and SOB  - continue home Coreg at 25 mg BID    5. Hypertension - Entresto and spironolactone added to HF regimen  - Continue Coreg    6. Hyperlipidemia  - Continue statin   7. CAD - Plan as above    8. Type 2 diabetes - Last A1c 8.1 on 01/23/2021 - Insulin regimen per primary team  - Needs SGLT2i   9. PVD s/p R BKA  - Diabetic foot wound complicated by sepsis that led to amputation on 01/25/2021 - Continue aspirin and statin    10. Medical noncompliance - Will request paramedicine services after discharge  - Patient has visual impairment that he reports as a contributing factor to his noncompliance with medications. TOC also consulted    OK for home today  HF meds  ASA 81 Plavix 75 daily Jardiance 10 daily  Entresto 49/51 bid Cleda Daub 25 daily Carvedilol 6.25 bid Torsemide 20 with Kdur 20 prn only (if unable to get Jardiance then would give torsemide daily) Crestor 40 (increased dose)  Paramedicine arranged.   D/c meds arranged with HF pharmacists personally.   Total time spent 45 minutes. Over half that time spent discussing above.    Length of Stay: 2  Arvilla Meres, MD  04/13/2021, 10:59 AM  Advanced Heart Failure Team Pager [phone] (M-F; 7a - 5p)  Please contact [facility] for night-coverage after hours (5p -7a ) and weekends on [email or web address]

## 2021-04-14 ENCOUNTER — Telehealth: Payer: Self-pay | Admitting: *Deleted

## 2021-04-14 NOTE — Telephone Encounter (Signed)
Transition Care Management Follow-up Telephone Call Date of discharge and from where: 04/13/21   [facility] How have you been since you were released from the hospital? "Well" Any questions or concerns? No  Items Reviewed: Did the pt receive and understand the discharge instructions provided? Yes  Medications obtained and verified? Yes  Other? N/a Any new allergies since your discharge? No  Dietary orders reviewed? Yes Do you have support at home? Yes    wife and daughter  Home Care and Equipment/Supplies: Were home health services ordered? no If so, what is the name of the agency?  Has the agency set up a time to come to the patient's home? not applicable Were any new equipment or medical supplies ordered?  No What is the name of the medical supply agency? N/a Were you able to get the supplies/equipment? N/a Do you have any questions related to the use of the equipment or supplies? N/a   Functional Questionnaire: (I = Independent and D = Dependent) ADLs: I  Bathing/Dressing- I  Meal Prep- D  -  spouse assists with meal preparation   Eating- I  Maintaining continence- I  Transferring/Ambulation- D  -  pt has wheelchair r/t RBKA  Managing Meds- D - family assists due to eyesight  Follow up appointments reviewed:  PCP Hospital f/u appt confirmed? No    Pt does not have PCP appt. Specialist Hospital f/u appt confirmed? Yes  Scheduled to see [facility] on 04/20/21 @ 9 am Are transportation arrangements needed? No- pt uses [facility] If their condition worsens, is the pt aware to call PCP or go to the Emergency Dept.? Yes Was the patient provided with contact information for the PCP's office or ED? Yes Was to pt encouraged to call back with questions or concerns? Yes  Irving Shows [facility], BSN RN Case Manager [phone]

## 2021-04-15 ENCOUNTER — Other Ambulatory Visit: Payer: Self-pay

## 2021-04-15 ENCOUNTER — Encounter: Payer: Self-pay | Admitting: Physical Medicine and Rehabilitation

## 2021-04-15 ENCOUNTER — Other Ambulatory Visit: Payer: Self-pay | Admitting: Physical Medicine and Rehabilitation

## 2021-04-15 ENCOUNTER — Telehealth: Payer: Self-pay

## 2021-04-15 ENCOUNTER — Encounter
Payer: No Typology Code available for payment source | Attending: Physical Medicine and Rehabilitation | Admitting: Physical Medicine and Rehabilitation

## 2021-04-15 VITALS — BP 119/80 | HR 86 | Ht 71.5 in | Wt 282.9 lb

## 2021-04-15 DIAGNOSIS — F4310 Post-traumatic stress disorder, unspecified: Secondary | ICD-10-CM | POA: Diagnosis present

## 2021-04-15 DIAGNOSIS — F319 Bipolar disorder, unspecified: Secondary | ICD-10-CM | POA: Diagnosis present

## 2021-04-15 DIAGNOSIS — Z89511 Acquired absence of right leg below knee: Secondary | ICD-10-CM | POA: Insufficient documentation

## 2021-04-15 DIAGNOSIS — H259 Unspecified age-related cataract: Secondary | ICD-10-CM | POA: Insufficient documentation

## 2021-04-15 DIAGNOSIS — G4701 Insomnia due to medical condition: Secondary | ICD-10-CM | POA: Insufficient documentation

## 2021-04-15 MED ORDER — AMITRIPTYLINE HCL 10 MG PO TABS: 10.0000 mg | ORAL_TABLET | Freq: Every day | ORAL | 1 refills | Status: DC

## 2021-04-15 NOTE — Telephone Encounter (Signed)
Called and spoke with facility. Verbal orders given to [facility].

## 2021-04-15 NOTE — Telephone Encounter (Signed)
Ana from [facility] home health called trying to get verbal orders for consultation to resume PT for patient. Ana's call back # is [phone].

## 2021-04-15 NOTE — Progress Notes (Signed)
Subjective:    Patient ID: Mitchell [name], male    DOB: Dec 14, 1970, 50 y.o.   MRN: [id number]  HPI [name] is a 50 year old man who presents for hospital follow-up after CIR admission for R BKA.   Not having much pain in the residual limb. The phantom limb pain has gone. He had a heart attack earlier this week He had an appointment with [facility] earlier today- next appointment he will be getting his prosthesis He will be starting with the walker.  He just received his medications from the hospital. He is out of pain medication  Pain Inventory Average Pain 6 Pain Right Now 0 My pain is intermittent and stabbing  In the last 24 hours, has pain interfered with the following? General activity 0 Relation with others 0 Enjoyment of life 0 What TIME of day is your pain at its worst? daytime Sleep (in general) Poor  Pain is worse with: sitting Pain improves with: rest, medication, and elevation of stump Relief from Meds: 5  use a wheelchair needs help with transfers transfers alone  disabled: date disabled 01/25/21 I need assistance with the following:  meal prep, household duties, and shopping  trouble walking spasms depression  Any changes since last visit?  yes  Just got out of [facility] after having an MI on 04/11/21  Any changes since last visit?  no    Family History  Problem Relation Age of Onset   Heart disease Mother        MI   Heart failure Mother    Diabetes Mother        ALSO IN MOST OF HIS SIBLINGS; 2 UNLCES HAVE ALSO PASSED AWAY FROM DM   Cardiomyopathy Mother    Cancer - Ovarian Mother    Ovarian cancer Mother    Heart disease Father    Hypertension Father    Diabetes Father    Diabetes Sister    Diabetes Brother    Colon cancer Paternal Uncle    Colon cancer Paternal Uncle    Colon polyps Neg Hx    Esophageal cancer Neg Hx    Rectal cancer Neg Hx    Stomach cancer Neg Hx    Social History   Socioeconomic History   Marital status: Married     Spouse name: Not on file   Number of children: 5   Years of education: Not on file   Highest education level: Not on file  Occupational History   Occupation: Mortician  Tobacco Use   Smoking status: Never   Smokeless tobacco: Never  Vaping Use   Vaping Use: Never used  Substance and Sexual Activity   Alcohol use: No   Drug use: No   Sexual activity: Not on file  Other Topics Concern   Not on file  Social History Narrative   MARRIED, LIVES IN [place] WITH WIFE; GREW UP IN [place] AND USED TO BE A COOK; HE ENJOYS COOKING AND ENJOYS EATING A LOT OF PORK AND SALT.   Social Determinants of Health   Financial Resource Strain: Medium Risk   Difficulty of Paying Living Expenses: Somewhat hard  Food Insecurity: No Food Insecurity   Worried About Programme researcher, broadcasting/film/video in the Last Year: Never true   Ran Out of Food in the Last Year: Never true  Transportation Needs: No Transportation Needs   Lack of Transportation (Medical): No   Lack of Transportation (Non-Medical): No  Physical Activity: Not on file  Stress: Not on file  Social Connections: Not on file   Past Surgical History:  Procedure Laterality Date   AMPUTATION Right 01/25/2021   Procedure: RIGHT BELOW KNEE AMPUTATION;  Surgeon: Nadara Mustard, MD;  Location: [facility] OR;  Service: Orthopedics;  Laterality: Right;   CARDIAC CATHETERIZATION  12/19/10   DIFFUSE NONOBSTRUCTIVE CAD; NONISCHEMIC CARDIOMYOPATHY; LEFT VENTRICULAR ANGIOGRAM WAS PERFORMED SECONDARY TO  ELEVATED LEFT VENTRICULAR FILLING PRESSURES   COLONOSCOPY     ~ age 35-23   COLONOSCOPY W/ POLYPECTOMY     I & D EXTREMITY Bilateral 10/24/2019   Procedure: IRRIGATION AND DEBRIDEMENT BILATERAL EXTREMITY WOUND ON FOOT;  Surgeon: Allena Napoleon, MD;  Location: [facility] OR;  Service: Plastics;  Laterality: Bilateral;   METATARSAL HEAD EXCISION Right 08/29/2018   Procedure: METATARSAL HEAD RESECTION;  Surgeon: Felecia Shelling, DPM;  Location: [facility] OR;  Service: Podiatry;   Laterality: Right;   METATARSAL OSTEOTOMY Right 08/29/2018   Procedure: SUB FIFTHE METATARSIA RIGHT FOOT;  Surgeon: Felecia Shelling, DPM;  Location: [facility] OR;  Service: Podiatry;  Laterality: Right;   MULTIPLE EXTRACTIONS WITH ALVEOLOPLASTY  01/27/2014   "all my teeth; 4 Quadrants of alveoloplasty   MULTIPLE EXTRACTIONS WITH ALVEOLOPLASTY N/A 01/27/2014   Procedure: EXTRACTION OF TEETH #'1, 2, 3, 4, 5, 6, 7, 8, 9, 10, 11, 12, 13, 14, 15, 16, 17, 20, 21, 22, 23, 24, 25, 26, 27, 28, 29, 31 and 32 WITH ALVEOLOPLASTY;  Surgeon: Charlynne Pander, DDS;  Location: [facility] OR;  Service: Oral Surgery;  Laterality: N/A;   ORIF FINGER / THUMB FRACTURE Right    POLYPECTOMY     RIGHT HEART CATH N/A 01/27/2021   Procedure: RIGHT HEART CATH;  Surgeon: Dolores Patty, MD;  Location: [facility] INVASIVE CV LAB;  Service: Cardiovascular;  Laterality: N/A;   RIGHT/LEFT HEART CATH AND CORONARY ANGIOGRAPHY N/A 09/26/2017   Procedure: RIGHT/LEFT HEART CATH AND CORONARY ANGIOGRAPHY;  Surgeon: Dolores Patty, MD;  Location: [facility];  Service: Cardiovascular;  Laterality: N/A;   RIGHT/LEFT HEART CATH AND CORONARY ANGIOGRAPHY N/A 04/12/2021   Procedure: RIGHT/LEFT HEART CATH AND CORONARY ANGIOGRAPHY;  Surgeon: Dolores Patty, MD;  Location: [facility];  Service: Cardiovascular;  Laterality: N/A;   SKIN SPLIT GRAFT Bilateral 10/24/2019   Procedure: SKIN GRAFT SPLIT THICKNESS LEFT THIGH;  Surgeon: Allena Napoleon, MD;  Location: [facility] OR;  Service: Plastics;  Laterality: Bilateral;   WOUND DEBRIDEMENT Right 08/29/2018   Procedure: Debridement of ulcer on right fifth metatarsal;  Surgeon: Felecia Shelling, DPM;  Location: [facility] OR;  Service: Podiatry;  Laterality: Right;   Past Medical History:  Diagnosis Date   Bipolar disorder (HCC)    CAD (coronary artery disease)    a. diffuse 3v CAD by cath 2019, medical therapy recommended.   Cataract    forming    Chronic systolic CHF (congestive heart failure) (HCC)    CVA  (cerebral infarction)    No residual deficits   Depression    PTSD,    Diabetes mellitus    TYPE II; UNCONTROLLED BY HEMOGLOBIN A1c; STABLE AS  PER DISCHARGE   Headache(784.0)    Herpes simplex of male genitalia    History of colonic polyps    Hyperlipidemia    Hypertension    Myocardial infarction (HCC) 1987   (while playing football)   Obesity    OSA (obstructive sleep apnea)    repeat study 2018 without significant OSA   Pneumonia  Post-cardiac injury syndrome (HCC)    History of cardiac injury from blunt trauma   Pulmonary hypertension (HCC)    a. moderately elevated PASP 07/2019.   PVCs (premature ventricular contractions)    Schizophrenia (HCC)    Go[facility] to [facility]   Sleep apnea    Stroke [facility]) 2005   some left side weakness   Syncope    Recurrent, thought to be vasovagal. Also has h/o frequent PVCs.    BP 119/80   Pulse 86   Ht 5' 11.5" (1.816 m)   Wt 282 lb 13.6 oz (128.3 kg)   SpO2 95%   BMI 38.90 kg/m  weight was last recorded  Opioid Risk Score:   Fall Risk Score:  `1  Depression screen PHQ 2/9  Depression screen [facility] 2/9 04/15/2021 09/06/2018 07/18/2018 06/13/2018  Decreased Interest - 0 0 1  Down, Depressed, Hopeless 1 0 0 1  PHQ - 2 Score 1 0 0 2  Altered sleeping 1 - - 1  Tired, decreased energy 0 - - 1  Change in appetite 0 - - 1  Feeling bad or failure about yourself  1 - - 1  Trouble concentrating 1 - - 1  Moving slowly or fidgety/restless 0 - - 1  Suicidal thoughts 0 - - 1  PHQ-9 Score 4 - - 9  Some recent data might be hidden     Review of Systems  Constitutional: Negative.   HENT: Negative.    Eyes: Negative.   Respiratory: Negative.    Cardiovascular: Negative.   Gastrointestinal: Negative.   Genitourinary: Negative.   Musculoskeletal:  Positive for gait problem.  Skin: Negative.   Allergic/Immunologic: Negative.   Hematological:  Bruises/bleeds easily.       Plavix  Psychiatric/Behavioral:  Positive for  dysphoric mood.   All other systems reviewed and are negative.     Objective:   Physical Exam Gen: no distress, normal appearing HEENT: oral mucosa pink and moist, NCAT Cardio: Reg rate Chest: normal effort, normal rate of breathing Abd: soft, non-distended Ext: no edema Psych: pleasant, normal affect Skin: intact Neuro: Alert and oriented x3 Musculoskeletal: Residual limb healing well.     Assessment & Plan:  Mr. Tracz is a 50 year old man who presents for hospital follow-up after CIR admission for right BKA.  1) Right BKA: -continue follow-up with Dr. Lajoyce Corners -continue follow-up with [facility] for prosthesis.   2) Cataracts: -continue follow-up with eye doctor  3) PTSD -cannot tolerate Seroquel  4) Residual limb pain: -continue pain medication as per Dr. Lajoyce Corners  5)Bipolar disorder: -encouraged follow-up with mental health  6) Insomnia: -Try to go outside near sunrise -Get exercise during the day.  -Discussed good sleep hygiene: turning off all devices an hour before bedtime.  -Chamomile and valerian tea with dinner.  -Can consider over the counter melatonin -discussed amitripytyline 10mg  HS

## 2021-04-15 NOTE — Patient Instructions (Addendum)
-  Discussed following foods that may reduce pain: 1) Ginger (especially studied for arthritis)- reduce leukotriene production to decrease inflammation 2) Blueberries- high in phytonutrients that decrease inflammation 3) Salmon- marine omega-3s reduce joint swelling and pain 4) Pumpkin seeds- reduce inflammation 5) dark chocolate- reduces inflammation 6) turmeric- reduces inflammation 7) tart cherries - reduce pain and stiffness 8) extra virgin olive oil - its compound olecanthal helps to block prostaglandins  9) chili peppers- can be eaten or applied topically via capsaicin 10) mint- helpful for headache, muscle aches, joint pain, and itching 11) garlic- reduces inflammation  Link to further information on diet for chronic pain: [email or web address]   Insomnia: -Try to go outside near sunrise -Get exercise during the day.  -Discussed good sleep hygiene: turning off all devices an hour before bedtime.  -Chamomile and valerian tea with dinner.  -Can consider over the counter melatonin -tart cherry juice

## 2021-04-18 ENCOUNTER — Encounter: Payer: Self-pay | Admitting: Orthopedic Surgery

## 2021-04-18 ENCOUNTER — Ambulatory Visit (INDEPENDENT_AMBULATORY_CARE_PROVIDER_SITE_OTHER): Payer: No Typology Code available for payment source | Admitting: Physician Assistant

## 2021-04-18 DIAGNOSIS — Z89511 Acquired absence of right leg below knee: Secondary | ICD-10-CM

## 2021-04-18 MED ORDER — OXYCODONE-ACETAMINOPHEN 5-325 MG PO TABS: 1.0000 | ORAL_TABLET | Freq: Three times a day (TID) | ORAL | 0 refills | Status: DC | PRN
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
## 2021-04-19 ENCOUNTER — Telehealth (HOSPITAL_COMMUNITY): Payer: Self-pay

## 2021-04-19 ENCOUNTER — Telehealth: Payer: Self-pay | Admitting: Family Medicine

## 2021-04-19 NOTE — Telephone Encounter (Signed)
Liji from [facility] is calling to get physical therapy orders for the patient. She would like one more visit this week to re-certified the patient. She can be reached at [phone].

## 2021-04-19 NOTE — Telephone Encounter (Signed)
Called and left patient a message to give our office a call to confirm/remind patient of their appointment at the [facility] on 04/20/21.

## 2021-04-19 NOTE — Telephone Encounter (Signed)
Okay for order?

## 2021-04-19 NOTE — Progress Notes (Signed)
ADVANCED HF CLINIC NOTE  PCP: [name], MD Primary Cardiologist: Dr. [name]  HPI:  Mitchell [name] is a 50 y.o. male with multiple medical problems HFrEF, mixed ischemic/nonischemic CM, 3v CAD, obesity, hypertension, type II DM, obstructive sleep apnea, bipolar disease/schizophrenia, prior CVA.    Had [facility] on 09/26/17 with 3v disease and preserved CO/CI. No revascularization options. Saw EP about ICD but did not f/u.   Had been lost to follow-up in HF clinic since 2020.  Patient admitted June 2022 with sepsis secondary to RLE abscess/cellulitis and COVID-19. He ended up requiring R BKA.  Readmitted 04/11/21 with a/c HFrEF and NSTEMI.  He was diuresed with IV lasix and started on GDMT. L/RHC with severe 3 vessel CAD (no targets for intervention), RA 4, PA 49/17 (31), PCW 15, Fick CO/index 6.7/2.7. He was referred to paramedicine program at discharge. Visual impairment felt to be contributor to issues with medication compliance.  He is here today for hospital follow-up. He has been doing well since discharge. No complaints of dyspnea or CP. Denies LE edema. He is getting a RLE prosthesis on the 30th of this month. No complaints of dizziness, presyncope or syncope. Has been taking Torsemide and potassium supplement every other day rather than as needed. Otherwise, taking medications as prescribed. He is concerned about cost of future refills.   Has been referred for paramedicine and is meeting with HF CSW after today's visit.     CARDIAC TESTING: R/LHC, 04/12/2021:   Prox LAD to Mid LAD lesion is 80% stenosed.   Dist LAD lesion is 99% stenosed.   Dist RCA lesion is 90% stenosed.   Ost 1st Mrg lesion is 95% stenosed.   1st Mrg lesion is 99% stenosed.   Ost 2nd Mrg to 2nd Mrg lesion is 99% stenosed.   Ost RPDA to RPDA lesion is 90% stenosed.   RPAV lesion is 95% stenosed.   Mid LM lesion is 20% stenosed. Findings: Ao = 131/84 (107) LV = 131/22 RA = 4 RV = 49/11 PA =  49/17 (31) PCW = 15 Fick cardiac output/index = 6.7/2.7 PVR = 2.4 FA sat = 95% PA sat = 56%, 63% Assessment: Severe 3v diffuse CAD with moderate progression from previous Ischemic CM EF 25% Mild pulmonary venous HTN with preserved cardiac output    Echo, 01/24/2021: EF 20-25%, mild LVH, trivial MR   ROS: All systems negative except as listed in HPI, PMH and Problem List.  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   Social Determinants of Health   Financial Resource Strain: Medium Risk   Difficulty of Paying Living Expenses: Somewhat hard  Food Insecurity: No Food Insecurity   Worried About [place] in the Last Year:  Never true   Ran Out of Food in the Last Year: Never true  Transportation Needs: No Transportation Needs   Lack of Transportation (Medical): No   Lack of Transportation (Non-Medical): No  Physical Activity: Not on file  Stress: Not on file  Social Connections: Not on file  Intimate Partner Violence: Not on file    FH:  Family History  Problem Relation Age of Onset   Heart disease Mother        MI   Heart failure Mother    Diabetes Mother        ALSO IN MOST OF HIS SIBLINGS; 2 UNLCES HAVE ALSO PASSED AWAY FROM DM   Cardiomyopathy Mother    Cancer - Ovarian Mother    Ovarian cancer Mother    Heart disease Father    Hypertension  Father    Diabetes Father    Diabetes Sister    Diabetes Brother    Colon cancer Paternal Uncle    Colon cancer Paternal Uncle    Colon polyps Neg Hx    Esophageal cancer Neg Hx    Rectal cancer Neg Hx    Stomach cancer Neg Hx     Past Medical History:  Diagnosis Date   Bipolar disorder ([place])    CAD (coronary artery disease)    a. diffuse 3v CAD by cath 2019, medical therapy recommended.   Cataract    forming    Chronic systolic CHF (congestive heart failure) (HCC)    CVA (cerebral infarction)    No residual deficits   Depression    PTSD,    Diabetes mellitus    TYPE II; UNCONTROLLED BY HEMOGLOBIN A1c; STABLE AS  PER DISCHARGE   Headache(784.0)    Herpes simplex of male genitalia    History of colonic polyps    Hyperlipidemia    Hypertension    Myocardial infarction ([place]) 1987   (while playing football)   Obesity    OSA (obstructive sleep apnea)    repeat study 2018 without significant OSA   Pneumonia    Post-cardiac injury syndrome ([place])    History of cardiac injury from blunt trauma   Pulmonary hypertension ([place])    a. moderately elevated PASP 07/2019.   PVCs (premature ventricular contractions)    Schizophrenia ([place])    Goes to [facility]   Sleep apnea    Stroke [facility]) 2005   some left side weakness   Syncope    Recurrent, thought to be vasovagal. Also has h/o frequent PVCs.     Current Outpatient Medications  Medication Sig Dispense Refill   ascorbic acid (VITAMIN C) 250 MG tablet Take 1 tablet (250 mg total) by mouth daily. 30 tablet 0   aspirin 81 MG EC tablet Take 1 tablet (81 mg total) by mouth daily. 30 tablet 12   Blood Glucose Monitoring Suppl (ACCU-CHEK GUIDE ME) w/Device KIT 1 each by Does not apply route 2 (two) times daily. Use to monitor glucose levels BID 1 kit 0   gabapentin (NEURONTIN) 100 MG capsule Take 1 capsule (100 mg total) by mouth at bedtime. 30 capsule 0   glucose blood (ACCU-CHEK GUIDE) test strip 1 each by Other  route in the morning and at bedtime. And lancets 2/day. 200 each 3   hydrocerin (EUCERIN) CREA Apply 1 application topically 2 (two) times daily. To left foot/leg 454 g 0   insulin glargine (LANTUS) 100 UNIT/ML injection Inject 0.21 mLs (21 Units total) into the skin 2 (two)  times daily. THIS IS THE SAME AS BASIGLAR--THE INSULIN IS DIVIDED INTO TWICE A DAY FOR BETTER COVERAGE. 10 mL 11   Lancets 28G MISC Use for glucose testing up to BID 100 each 12   Lancets Misc. (ACCU-CHEK FASTCLIX LANCET) KIT Use to monitor glucose levels BID 200 kit 1   methocarbamol (ROBAXIN) 500 MG tablet Take 1 tablet (500 mg total) by mouth every 6 (six) hours as needed for muscle spasms. 60 tablet 0   Multiple Vitamin (MULTIVITAMIN WITH MINERALS) TABS tablet Take 1 tablet by mouth daily.     nutrition supplement, JUVEN, (JUVEN) PACK Take 1 packet by mouth 2 (two) times daily between meals. 60 each 0   oxyCODONE-acetaminophen (PERCOCET/ROXICET) 5-325 MG tablet Take 1 tablet by mouth every 8 (eight) hours as needed for severe pain. 30 tablet 0   pantoprazole (PROTONIX) 40 MG tablet Take 1 tablet (40 mg total) by mouth daily. 30 tablet 0   polyethylene glycol (MIRALAX / GLYCOLAX) 17 g packet Take 17 g by mouth daily. 30 each 0   sacubitril-valsartan (ENTRESTO) 97-103 MG Take 1 tablet by mouth 2 (two) times daily. 60 tablet 11   senna (SENOKOT) 8.6 MG TABS tablet Take 2 tablets (17.2 mg total) by mouth at bedtime. 30 tablet 0   simethicone (MYLICON) 80 MG chewable tablet Chew 1 tablet (80 mg total) by mouth 4 (four) times daily. 120 tablet 0   amitriptyline (ELAVIL) 10 MG tablet Take 1 tablet (10 mg total) by mouth at bedtime. 30 tablet 1   carvedilol (COREG) 6.25 MG tablet Take 1 tablet (6.25 mg total) by mouth 2 (two) times daily with a meal. 180 tablet 3   clopidogrel (PLAVIX) 75 MG tablet Take 1 tablet (75 mg total) by mouth daily. 30 tablet 3   dapagliflozin propanediol (FARXIGA) 10 MG TABS tablet Take 1 tablet (10 mg  total) by mouth daily before breakfast. 30 tablet 3   potassium chloride SA (KLOR-CON) 20 MEQ tablet Take 1 tablet (20 mEq total) by mouth every other day. 15 tablet 3   rosuvastatin (CRESTOR) 40 MG tablet Take 1 tablet (40 mg total) by mouth daily. 30 tablet 3   spironolactone (ALDACTONE) 25 MG tablet Take 1 tablet (25 mg total) by mouth daily. 30 tablet 3   torsemide (DEMADEX) 20 MG tablet Take 1 tablet (20 mg total) by mouth every other day. 15 tablet 3   No current facility-administered medications for this encounter.    Vitals:   04/20/21 0847  BP: (!) 134/94  Pulse: 90  SpO2: 98%  Unable to stand for weight today  PHYSICAL EXAM:  General:  Well appearing. No resp difficulty HEENT: normal Neck: supple. JVP flat. Carotids 2+ bilaterally; no bruits. No lymphadenopathy or thryomegaly appreciated. Cor: PMI normal. Regular rate & rhythm. No rubs, gallops or murmurs. Lungs: clear Abdomen: soft, nontender, nondistended. No hepatosplenomegaly. No bruits or masses. Good bowel sounds. Extremities: no cyanosis, clubbing, rash, s/p right BKA Neuro: alert & orientedx3, cranial nerves grossly intact. Moves all 4 extremities w/o difficulty. Affect pleasant.   ECG: NSR 87 bpm   ASSESSMENT & PLAN:   Chronic systolic HF /Mixed ischemic/nonischemic cardiomyopathy  - EF 20 to 25% on echo in June 2022, stable from prior exams dating back to 2015  - severe 3v CAD on cardiac cath (no targets for revascularization) - Recent admit with a/c CHF. Compliance an issue. Now enrolled in paramedicine. - NYHA IIIa Although difficult assessment d/t limited mobility after right BKA. Volume  status appears stable on exam.  Prescribed Torsemide and potassium prn but taking every other day. Reports he had labs at Nephrologist's office yesterday. Will request results.  - continue carvedilol 6.25 mg BID - continue Farxiga 10 mg daily  - continue spiro 25 mg daily - BP okay. Increase enstesto to 97/103 mg BID -  Will need repeat echo at some point once on maximum tolerated doses of GDMT   2. CAD/NSTEMI 09/22 - High-sensitivity troponins elevated to 115[address] during recent admit 09/22. EKG with no signs of acute ischemia. Cath with progressive 3v CAD as above. No targets for intervention.  - Denies any angina - Continue DAPT with aspirin and plavix, statin and beta blocker.    3. Hypertension - BP above goal. Adjusting medications as above   4. Hyperlipidemia  - Continue statin   5. Type 2 diabetes - Last A1c 8.1 on 01/23/2021 - Continue Farxiga - On insulin, management per PCP   9. PVD s/p R BKA  - Diabetic foot wound complicated by sepsis that led to amputation on 01/25/2021 - Continue aspirin and statin    SDOH  -Reviewed medications with CPhT. Copay cards provided for [place]. Request sent to HF CSW to evaluate options for assisting with generic medications. He is commercially insured. Reports he is currently out of work. -Has been enrolled in paramedicine to help with medication management.   F/u - BMET 2 weeks and visit with pharmD for further med titration. Attempt to increase carvedilol further. F/u in APP clinic in 6 weeks   Marlyce Huge, PA-C

## 2021-04-20 ENCOUNTER — Other Ambulatory Visit (HOSPITAL_COMMUNITY): Payer: Self-pay

## 2021-04-20 ENCOUNTER — Other Ambulatory Visit: Payer: Self-pay

## 2021-04-20 ENCOUNTER — Encounter (HOSPITAL_COMMUNITY): Payer: Self-pay

## 2021-04-20 ENCOUNTER — Ambulatory Visit (HOSPITAL_COMMUNITY)
Admit: 2021-04-20 | Discharge: 2021-04-20 | Disposition: A | Discharge status: Home / Self Care | Payer: No Typology Code available for payment source | Attending: Family Medicine | Admitting: Family Medicine

## 2021-04-20 ENCOUNTER — Telehealth (HOSPITAL_COMMUNITY): Payer: Self-pay | Admitting: Pharmacy Technician

## 2021-04-20 VITALS — BP 134/94 | HR 90

## 2021-04-20 DIAGNOSIS — Z8616 Personal history of COVID-19: Secondary | ICD-10-CM | POA: Diagnosis not present

## 2021-04-20 DIAGNOSIS — I2510 Atherosclerotic heart disease of native coronary artery without angina pectoris: Secondary | ICD-10-CM

## 2021-04-20 DIAGNOSIS — I739 Peripheral vascular disease, unspecified: Secondary | ICD-10-CM

## 2021-04-20 DIAGNOSIS — Z794 Long term (current) use of insulin: Secondary | ICD-10-CM | POA: Diagnosis not present

## 2021-04-20 DIAGNOSIS — I252 Old myocardial infarction: Secondary | ICD-10-CM | POA: Insufficient documentation

## 2021-04-20 DIAGNOSIS — E782 Mixed hyperlipidemia: Secondary | ICD-10-CM | POA: Diagnosis not present

## 2021-04-20 DIAGNOSIS — I5022 Chronic systolic (congestive) heart failure: Secondary | ICD-10-CM

## 2021-04-20 DIAGNOSIS — G4733 Obstructive sleep apnea (adult) (pediatric): Secondary | ICD-10-CM | POA: Diagnosis not present

## 2021-04-20 DIAGNOSIS — Z8249 Family history of ischemic heart disease and other diseases of the circulatory system: Secondary | ICD-10-CM | POA: Insufficient documentation

## 2021-04-20 DIAGNOSIS — Z8673 Personal history of transient ischemic attack (TIA), and cerebral infarction without residual deficits: Secondary | ICD-10-CM | POA: Diagnosis not present

## 2021-04-20 DIAGNOSIS — I110 Hypertensive heart disease with heart failure: Secondary | ICD-10-CM | POA: Insufficient documentation

## 2021-04-20 DIAGNOSIS — Z79899 Other long term (current) drug therapy: Secondary | ICD-10-CM | POA: Diagnosis not present

## 2021-04-20 DIAGNOSIS — I10 Essential (primary) hypertension: Secondary | ICD-10-CM

## 2021-04-20 DIAGNOSIS — Z7902 Long term (current) use of antithrombotics/antiplatelets: Secondary | ICD-10-CM | POA: Insufficient documentation

## 2021-04-20 DIAGNOSIS — I255 Ischemic cardiomyopathy: Secondary | ICD-10-CM | POA: Diagnosis not present

## 2021-04-20 DIAGNOSIS — Z7984 Long term (current) use of oral hypoglycemic drugs: Secondary | ICD-10-CM | POA: Insufficient documentation

## 2021-04-20 DIAGNOSIS — E785 Hyperlipidemia, unspecified: Secondary | ICD-10-CM | POA: Insufficient documentation

## 2021-04-20 DIAGNOSIS — Z89511 Acquired absence of right leg below knee: Secondary | ICD-10-CM | POA: Diagnosis not present

## 2021-04-20 DIAGNOSIS — Z7982 Long term (current) use of aspirin: Secondary | ICD-10-CM | POA: Diagnosis not present

## 2021-04-20 DIAGNOSIS — E669 Obesity, unspecified: Secondary | ICD-10-CM | POA: Insufficient documentation

## 2021-04-20 DIAGNOSIS — I272 Pulmonary hypertension, unspecified: Secondary | ICD-10-CM | POA: Diagnosis not present

## 2021-04-20 DIAGNOSIS — E1151 Type 2 diabetes mellitus with diabetic peripheral angiopathy without gangrene: Secondary | ICD-10-CM | POA: Insufficient documentation

## 2021-04-20 MED ORDER — CARVEDILOL 6.25 MG PO TABS: 6.2500 mg | ORAL_TABLET | Freq: Two times a day (BID) | ORAL | 3 refills | Status: DC

## 2021-04-20 MED ORDER — TORSEMIDE 20 MG PO TABS: 20.0000 mg | ORAL_TABLET | ORAL | 3 refills | Status: DC

## 2021-04-20 MED ORDER — ENTRESTO 97-103 MG PO TABS
1.0000 | ORAL_TABLET | Freq: Two times a day (BID) | ORAL | 11 refills | Status: DC
Start: 2021-04-20 — End: 2021-05-04

## 2021-04-20 MED ORDER — DAPAGLIFLOZIN PROPANEDIOL 10 MG PO TABS: 10.0000 mg | ORAL_TABLET | Freq: Every day | ORAL | 3 refills | Status: DC

## 2021-04-20 MED ORDER — POTASSIUM CHLORIDE CRYS ER 20 MEQ PO TBCR: 20.0000 meq | EXTENDED_RELEASE_TABLET | ORAL | 3 refills | Status: DC

## 2021-04-20 MED ORDER — CLOPIDOGREL BISULFATE 75 MG PO TABS: 75.0000 mg | ORAL_TABLET | Freq: Every day | ORAL | 3 refills | Status: DC

## 2021-04-20 MED ORDER — SPIRONOLACTONE 25 MG PO TABS
25.0000 mg | ORAL_TABLET | Freq: Every day | ORAL | 3 refills | Status: DC
Start: 2021-04-20 — End: 2021-09-30

## 2021-04-20 MED ORDER — ROSUVASTATIN CALCIUM 40 MG PO TABS: 40.0000 mg | ORAL_TABLET | Freq: Every day | ORAL | 3 refills | Status: DC

## 2021-04-20 NOTE — Telephone Encounter (Signed)
Advanced Heart Failure Patient Advocate Encounter  Patient was seen in clinic today and expressed financial concerns regarding medication co-pays. I was able to give the patient a co-pay card for both Entresto ($10) and Farxiga ($0-$15).  I explained to the patient that since he is commercially insured, there are limited options to helping with generic medication co-pays. Sent [place] (CSW) a request to help with generic medications temporarily.  Archer Asa, CPhT

## 2021-04-20 NOTE — Progress Notes (Signed)
CSW met with pt in clinic to follow up post hospital DC.  Provided pt with number to [facility] Transportation again so he can get a ride to future appts.  Also provided pt with number to [facility] where he had been getting mental health follow up previously  Paramedicine will be going out to help pt with medication management- message sent to paramedic requesting visit be set up soon as pt had med changes today  Will continue to follow and assist a needed  Jorge Ny, [place] Worker [facility] Desk#: [phone] Cell#: [phone]

## 2021-04-20 NOTE — Patient Instructions (Signed)
INCREASE Entresto to 97/103 mg, one tab twice a day  Your physician recommends that you schedule a follow-up appointment in: 2 weeks with the pharmacy team and in 6 weeks  in the Advanced Practitioners (PA/NP) Clinic    Do the following things EVERYDAY: Weigh yourself in the morning before breakfast. Write it down and keep it in a log. Take your medicines as prescribed Eat low salt foods--Limit salt (sodium) to 2000 mg per day.  Stay as active as you can everyday Limit all fluids for the day to less than 2 liters  At the [facility], you and your health needs are our priority. As part of our continuing mission to provide you with exceptional heart care, we have created designated Provider Care Teams. These Care Teams include your primary Cardiologist (physician) and Advanced Practice Providers (APPs- Physician Assistants and Nurse Practitioners) who all work together to provide you with the care you need, when you need it.   You may see any of the following providers on your designated Care Team at your next follow up: Dr Arvilla Meres Dr Marca Ancona Dr Brandon Melnick, NP Robbie Lis, [place] Mikki Santee Karle Plumber, PharmD   Please be sure to bring in all your medications bottles to every appointment.

## 2021-04-21 ENCOUNTER — Telehealth (HOSPITAL_COMMUNITY): Payer: Self-pay | Admitting: *Deleted

## 2021-04-21 MED ORDER — POTASSIUM CHLORIDE CRYS ER 20 MEQ PO TBCR: EXTENDED_RELEASE_TABLET | ORAL | 3 refills | Status: DC

## 2021-04-21 MED ORDER — TORSEMIDE 20 MG PO TABS: 20.0000 mg | ORAL_TABLET | ORAL | 3 refills | Status: DC | PRN

## 2021-04-21 NOTE — Telephone Encounter (Addendum)
Labs received from [facility] Per Lillia Abed Finch,PA SCR, BUN, and sodium up from last week. Decrease torsemide and potassium to as needed. Get repeat BMET at pharmD visit 10/13. Pt aware and agreeable with plan.   04/19/21 K=4.6 Creatinine= 1.60 BUN=23 Sodium=145   Labs in scan folder to be scanned into chart.

## 2021-04-25 ENCOUNTER — Other Ambulatory Visit (HOSPITAL_COMMUNITY): Payer: Self-pay
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
## 2021-04-27 ENCOUNTER — Telehealth (HOSPITAL_COMMUNITY): Payer: Self-pay

## 2021-04-27 ENCOUNTER — Other Ambulatory Visit (HOSPITAL_COMMUNITY): Payer: Self-pay

## 2021-04-27 NOTE — Telephone Encounter (Signed)
[facility] Transitions of Care Follow-up Telephone Call  Date of discharge: 04/13/21  Discharge Diagnosis: Acute Pulmonary edema/HFrEF  How have you been since you were released from the hospital?  Patient doing well since discharge. No questions about meds at this time.  Medication changes made at discharge: START:     Clopidogrel 75mg  STOP: acetaminophen 325 MG tablet (TYLENOL)  docusate sodium 100 MG capsule (COLACE)  doxycycline 100 MG tablet (VIBRA-TABS)  rosuvastatin 20 MG tablet (Crestor)  torsemide 10 MG tablet (DEMADEX)   Medication changes verified by the patient? Yes    Medication Accessibility:  Home Pharmacy: not discussed   Was the patient provided with refills on discharged medications? All meds discontinued in hospital system for reorder to home pharmacy   [facility] all prescriptions been [facility] to home pharmacy? N/a   Is the patient able to afford medications? Has insurance    Medication Review:  CLOPIDOGREL (PLAVIX) Clopidogrel 75 mg once daily.  - Educated patient on expected duration of therapy of ASA with clopidogrel.  - Advised patient of medications to avoid (NSAIDs, ASA)  - Educated that Tylenol (acetaminophen) will be the preferred analgesic to prevent risk of bleeding  - Emphasized importance of monitoring for signs and symptoms of bleeding (abnormal bruising, prolonged bleeding, nose bleeds, bleeding from gums, discolored urine, black tarry stools)  - Advised patient to alert all providers of anticoagulation therapy prior to starting a new medication or having a procedure    Follow-up Appointments:  PCP Hospital f/u appt confirmed?  Has had multiple telephone encounters with IM since discharge, no office visits scheduled currently  Specialist Hospital f/u appt confirmed?  Scheduled to see [facility]. [facility] on 04/19/21 @ Cardiology.   If their condition worsens, is the pt aware to call PCP or go to the Emergency Dept.? Yes  Final Patient  Assessment: Patient has follow up scheduled and refills at home pharmacy

## 2021-04-28 ENCOUNTER — Telehealth: Payer: Self-pay | Admitting: Family Medicine

## 2021-04-28 ENCOUNTER — Other Ambulatory Visit (HOSPITAL_COMMUNITY): Payer: Self-pay

## 2021-04-28 NOTE — Telephone Encounter (Signed)
Mitchell [name] is aware.  

## 2021-04-28 NOTE — Telephone Encounter (Signed)
[facility] the [facility]. She would like to extend physical therapy 2x a week for 3 weeks and 1x a week for 2 weeks. Effective 05/02/2021. Any new concerns [phone].

## 2021-04-29 ENCOUNTER — Telehealth: Payer: Self-pay | Admitting: Orthopedic Surgery

## 2021-04-29 ENCOUNTER — Other Ambulatory Visit (HOSPITAL_COMMUNITY): Payer: Self-pay

## 2021-04-29 DIAGNOSIS — Z89511 Acquired absence of right leg below knee: Secondary | ICD-10-CM

## 2021-04-29 NOTE — Telephone Encounter (Signed)
Patient called. He would like a referral for outpatient PT with [facility].

## 2021-05-02 ENCOUNTER — Other Ambulatory Visit (HOSPITAL_COMMUNITY): Payer: Self-pay

## 2021-05-02 ENCOUNTER — Encounter: Payer: Self-pay | Admitting: Orthopedic Surgery

## 2021-05-02 ENCOUNTER — Ambulatory Visit (INDEPENDENT_AMBULATORY_CARE_PROVIDER_SITE_OTHER): Payer: No Typology Code available for payment source | Admitting: Orthopedic Surgery

## 2021-05-02 ENCOUNTER — Other Ambulatory Visit (HOSPITAL_COMMUNITY): Payer: Self-pay | Admitting: *Deleted

## 2021-05-02 ENCOUNTER — Telehealth (HOSPITAL_COMMUNITY): Payer: Self-pay | Admitting: Pharmacy Technician

## 2021-05-02 DIAGNOSIS — Z89511 Acquired absence of right leg below knee: Secondary | ICD-10-CM

## 2021-05-02 MED ORDER — DAPAGLIFLOZIN PROPANEDIOL 10 MG PO TABS
10.0000 mg | ORAL_TABLET | Freq: Every day | ORAL | 11 refills | Status: DC
  Filled 2021-05-02: qty 30, 30d supply, fill #0

## 2021-05-02 NOTE — Telephone Encounter (Signed)
Advanced Heart Failure Patient Advocate Encounter  Received a message from Kerry Hough, (EMT) that the patient had a $75 co-pay for [place] at [facility]. Called and spoke to the [facility] at the pharmacy. They have issues billing co-pay cards at times and will not be able to get the co-pay to the $0 that it should be. Sent 30 day RX request to [place] ([facility]) to send to [facility]. It will be mailed to the patient, confirmed $0 co-pay.  Archer Asa, CPhT
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
## 2021-05-03 ENCOUNTER — Other Ambulatory Visit: Payer: Self-pay | Admitting: Family Medicine

## 2021-05-03 ENCOUNTER — Other Ambulatory Visit (HOSPITAL_COMMUNITY): Payer: Self-pay
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
## 2021-05-04 ENCOUNTER — Other Ambulatory Visit (HOSPITAL_COMMUNITY): Payer: Self-pay

## 2021-05-04 ENCOUNTER — Telehealth (HOSPITAL_COMMUNITY): Payer: Self-pay

## 2021-05-04 ENCOUNTER — Telehealth: Payer: Self-pay

## 2021-05-04 ENCOUNTER — Other Ambulatory Visit (HOSPITAL_COMMUNITY): Payer: Self-pay | Admitting: *Deleted

## 2021-05-04 ENCOUNTER — Encounter: Payer: Self-pay | Admitting: Physical Therapy

## 2021-05-04 ENCOUNTER — Ambulatory Visit (INDEPENDENT_AMBULATORY_CARE_PROVIDER_SITE_OTHER): Payer: No Typology Code available for payment source | Admitting: Physical Therapy

## 2021-05-04 ENCOUNTER — Other Ambulatory Visit: Payer: Self-pay

## 2021-05-04 VITALS — HR 61

## 2021-05-04 DIAGNOSIS — R2689 Other abnormalities of gait and mobility: Secondary | ICD-10-CM | POA: Diagnosis not present

## 2021-05-04 DIAGNOSIS — Z9181 History of falling: Secondary | ICD-10-CM

## 2021-05-04 DIAGNOSIS — R2681 Unsteadiness on feet: Secondary | ICD-10-CM

## 2021-05-04 DIAGNOSIS — M6281 Muscle weakness (generalized): Secondary | ICD-10-CM | POA: Diagnosis not present

## 2021-05-04 DIAGNOSIS — R293 Abnormal posture: Secondary | ICD-10-CM

## 2021-05-04 MED ORDER — ENTRESTO 97-103 MG PO TABS: 1.0000 | ORAL_TABLET | Freq: Two times a day (BID) | ORAL | 11 refills | Status: DC

## 2021-05-04 MED ORDER — DAPAGLIFLOZIN PROPANEDIOL 10 MG PO TABS
10.0000 mg | ORAL_TABLET | Freq: Every day | ORAL | 11 refills | Status: DC
Start: 2021-05-04 — End: 2022-04-19

## 2021-05-04 NOTE — Telephone Encounter (Signed)
Contacted [facility]-he wishes to keep using [facility] instead of mail order or delivery.  Now since he has prosthetic leg he is planning to be more mobile and get out of the house.   Called in the following- spiro,rosovastatin, clopidogrel, amitriptyline  The entresto and the farxiga is needing new rx sent to them-asked triage staff to handle that and also the ASA is OTC he will have to p/u.   I called [facility] and the farxiga was shipped out 2 days ago.    Kerry Hough, EMT-Paramedic  05/04/21

## 2021-05-04 NOTE — Telephone Encounter (Signed)
FYI: Chip Boer called states that pt is not doing therapy through the home, he will be going to outpatient therapy.
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
## 2021-05-05 ENCOUNTER — Other Ambulatory Visit (HOSPITAL_COMMUNITY): Payer: Self-pay | Admitting: Internal Medicine

## 2021-05-05 DIAGNOSIS — Z006 Encounter for examination for normal comparison and control in clinical research program: Secondary | ICD-10-CM

## 2021-05-05 NOTE — Research (Signed)
Subject # [id number] [employer] Lp(a) [id number] Site # [phone]  SEX [x]    Male                      []    Male  Ethnicity []   Hispanic or Latino   [x]   Not Hispanic or Latino  Race []   White                 [x]   Black or African American  []   Asian []   American [place] or [place] Native            []   Native Hawaiian or Other Pacific Islander                      []   Other  Other   Age 51  Subject Group [x]    Local Lab           []   Historical Lp(a) value                                       Results:  Future research [x]    Yes                    []   No    [employer] [id number] Site # [id number] Subject ID # [id number]         ELIGIBILITY CRITERIA WORKSHEET INCLUSION CRITERIA   Subject has provided informed consent prior to the initiation of any study specific activities/procedures [x]   Age 22 to 27 years [x]   MI (presumed type 1) OR [x]   PCI (with high-risk features) with at least 1 of the following: []   Age >43 []   Diabetes mellitus  HbA1c: 7.8 [x]   History of ischemic stroke []   History of peripheral arterial disease []   Residual stenosis ? 50% []   Multivessel PCI (ie, ? 2 vessels, including branch arteries []   EXCLUSIONS THE FOLLOWING N/A [x]   Subjects known to be currently receiving investigational drug in a clinical study that is anticipated to last > 1 year []   Known Lp(a) value 90mg /dL or < 200nmol/L []   Subject has a diagnosis of end-stage renal disease or requires dialysis. []   Poorly controlled (glycated hemoglobin [HbA1c] > 10%) diabetes mellitus (type 1 or type 2) []   Subject is receiving or has received lipoprotein apheresis to reduce Lp(a) within 3 months prior to enrollment. []   Known uncontrolled or recurrent ventricular tachycardia in the past 3 months prior to enrollment. []   Known malignancy (except non-melanoma skin cancers, cervical in situ carcinoma, breast ductal carcinoma in situ, or stage 1 prostate carcinoma) within the last 5 years prior to enrollment. []   Known  history or evidence of clinically significant disease (eg, respiratory, gastrointestinal, or psychiatric disease) or unstable disorder or biomarker that, in the opinion of the investigator(s), would result in life expectancy < 5 years. []   Known hemorrhagic stroke. []    [employer] Lp(a) Informed Consent   Subject Name: Mitchell [name]  Subject met inclusion and exclusion criteria.  The informed consent form, study requirements and expectations were reviewed with the subject and questions and concerns were addressed prior to the signing of the consent form.  The subject verbalized understanding of the trial requirements.  The subject agreed to participate in the [facility] Lp(a) trial and signed the informed consent at 0800 on 05/05/21.  The informed consent was obtained prior  to performance of any protocol-specific procedures for the subject.  A copy of the signed informed consent was given to the subject and a copy was placed in the subject's medical record.   Jerry Clyne M Vick Filter  [employer] Consent Version 2 Protocol Version 2  

## 2021-05-08 LAB — LIPOPROTEIN A (LPA): Lipoprotein (a): 110.7 nmol/L — ABNORMAL HIGH (ref <75.0)

## 2021-05-09 ENCOUNTER — Encounter: Payer: No Typology Code available for payment source | Admitting: Physical Therapy

## 2021-05-10 ENCOUNTER — Ambulatory Visit (INDEPENDENT_AMBULATORY_CARE_PROVIDER_SITE_OTHER): Payer: No Typology Code available for payment source | Admitting: Physical Therapy

## 2021-05-10 ENCOUNTER — Encounter: Payer: Self-pay | Admitting: Physical Therapy

## 2021-05-10 ENCOUNTER — Other Ambulatory Visit: Payer: Self-pay

## 2021-05-10 DIAGNOSIS — M6281 Muscle weakness (generalized): Secondary | ICD-10-CM

## 2021-05-10 DIAGNOSIS — R293 Abnormal posture: Secondary | ICD-10-CM | POA: Diagnosis not present

## 2021-05-10 DIAGNOSIS — R2681 Unsteadiness on feet: Secondary | ICD-10-CM

## 2021-05-10 DIAGNOSIS — R2689 Other abnormalities of gait and mobility: Secondary | ICD-10-CM | POA: Diagnosis not present
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
## 2021-05-10 NOTE — Patient Instructions (Signed)
Do each exercise 1-2  times per day Do each exercise 5-10 repetitions Hold each exercise for 2 seconds to feel your location  AT SINK FIND YOUR MIDLINE POSITION AND PLACE FEET EQUAL DISTANCE FROM THE MIDLINE.  Try to find this position when standing still for activities.   USE TAPE ON FLOOR TO MARK THE MIDLINE POSITION which is even with middle of sink.  You also should try to feel with your limb pressure in socket.  You are trying to feel with limb what you used to feel with the bottom of your foot.  Side to Side Shift: Moving your hips only (not shoulders): move weight onto your left leg, HOLD/FEEL pressure in socket.  Move back to equal weight on each leg, HOLD/FEEL pressure in socket. Move weight onto your right leg, HOLD/FEEL pressure in socket. Move back to equal weight on each leg, HOLD/FEEL pressure in socket. Repeat.  Start with both hands on sink, progress to hand on prosthetic side only, then no hands.  Front to Back Shift: Moving your hips only (not shoulders): move your weight forward onto your toes, HOLD/FEEL pressure in socket. Move your weight back to equal Flat Foot on both legs, HOLD/FEEL  pressure in socket. Move your weight back onto your heels, HOLD/FEEL  pressure in socket. Move your weight back to equal on both legs, HOLD/FEEL  pressure in socket. Repeat.  Start with both hands on sink, progress to hand on prosthetic side only, then no hands.  Moving Cones / Cups: With equal weight on each leg: Hold on with one hand the first time, then progress to no hand supports. Move cups from one side of sink to the other. Place cups ~2" out of your reach, progress to 10" beyond reach.  Place one hand in middle of sink and reach with other hand. Do both arms.  Then hover one hand and move cups with other hand.  Overhead/Upward Reaching: alternated reaching up to top cabinets or ceiling if no cabinets present. Keep equal weight on each leg. Start with one hand support on counter while other  hand reaches and progress to no hand support with reaching.  ace one hand in middle of sink and reach with other hand. Do both arms.  Then hover one hand and move cups with other hand.  5.   Looking Over Shoulders: With equal weight on each leg: alternate turning to look over your shoulders with one hand support on counter as needed.  Start with head motions only to look in front of shoulder, then even with shoulder and progress to looking behind you. To look to side, move head /eyes, then shoulder on side looking pulls back, shift more weight to side looking and pull hip back. Place one hand in middle of sink and let go with other hand so your shoulder can pull back. Switch hands to look other way.   Then hover one hand and look over shoulder. If looking right, use left hand at sink. If looking left, use right hand at sink. 6.  Stepping with leg that is not amputated:  Move items under cabinet out of your way. Shift your hips/pelvis so weight on prosthesis. Tighten muscles in hip on prosthetic side.  SLOWLY step other leg so front of foot is in cabinet. Then step back to floor.   

## 2021-05-11 ENCOUNTER — Encounter: Payer: No Typology Code available for payment source | Admitting: Physical Therapy

## 2021-05-12 ENCOUNTER — Other Ambulatory Visit (HOSPITAL_COMMUNITY): Payer: Self-pay

## 2021-05-12 ENCOUNTER — Ambulatory Visit (HOSPITAL_COMMUNITY)
Admission: RE | Admit: 2021-05-12 | Discharge: 2021-05-12 | Disposition: A | Discharge status: Home / Self Care | Payer: No Typology Code available for payment source | Source: Ambulatory Visit | Attending: Internal Medicine | Admitting: Internal Medicine

## 2021-05-12 ENCOUNTER — Other Ambulatory Visit: Payer: Self-pay

## 2021-05-12 VITALS — BP 132/84 | HR 89 | Wt 276.6 lb

## 2021-05-12 DIAGNOSIS — I2510 Atherosclerotic heart disease of native coronary artery without angina pectoris: Secondary | ICD-10-CM | POA: Insufficient documentation

## 2021-05-12 DIAGNOSIS — I5022 Chronic systolic (congestive) heart failure: Secondary | ICD-10-CM | POA: Insufficient documentation

## 2021-05-12 DIAGNOSIS — I110 Hypertensive heart disease with heart failure: Secondary | ICD-10-CM | POA: Insufficient documentation

## 2021-05-12 DIAGNOSIS — E1151 Type 2 diabetes mellitus with diabetic peripheral angiopathy without gangrene: Secondary | ICD-10-CM | POA: Insufficient documentation

## 2021-05-12 DIAGNOSIS — E785 Hyperlipidemia, unspecified: Secondary | ICD-10-CM | POA: Insufficient documentation

## 2021-05-12 DIAGNOSIS — Z89511 Acquired absence of right leg below knee: Secondary | ICD-10-CM | POA: Insufficient documentation

## 2021-05-12 DIAGNOSIS — G4733 Obstructive sleep apnea (adult) (pediatric): Secondary | ICD-10-CM | POA: Insufficient documentation

## 2021-05-12 DIAGNOSIS — I428 Other cardiomyopathies: Secondary | ICD-10-CM | POA: Diagnosis not present

## 2021-05-12 DIAGNOSIS — Z8616 Personal history of COVID-19: Secondary | ICD-10-CM | POA: Diagnosis not present

## 2021-05-12 DIAGNOSIS — Z8673 Personal history of transient ischemic attack (TIA), and cerebral infarction without residual deficits: Secondary | ICD-10-CM | POA: Diagnosis not present

## 2021-05-12 LAB — BASIC METABOLIC PANEL
Anion gap: 7 (ref 5–15)
BUN: 18 mg/dL (ref 6–20)
CO2: 27 mmol/L (ref 22–32)
Calcium: 9 mg/dL (ref 8.9–10.3)
Chloride: 109 mmol/L (ref 98–111)
Creatinine, Ser: 1.49 mg/dL — ABNORMAL HIGH (ref 0.61–1.24)
GFR, Estimated: 56 mL/min — ABNORMAL LOW (ref >60)
Glucose, Bld: 143 mg/dL — ABNORMAL HIGH (ref 70–99)
Potassium: 4.3 mmol/L (ref 3.5–5.1)
Sodium: 143 mmol/L (ref 135–145)

## 2021-05-12 MED ORDER — CARVEDILOL 12.5 MG PO TABS: 12.5000 mg | ORAL_TABLET | Freq: Two times a day (BID) | ORAL | 11 refills | Status: DC

## 2021-05-12 NOTE — Progress Notes (Signed)
PCP: Mitchell Blinks, MD Primary Cardiologist: Dr. Haroldine [name]   HPI: Mitchell [name] is a 50 y.o. male with multiple medical problems HFrEF, mixed ischemic/nonischemic CM, 3v CAD, obesity, hypertension, type II DM, obstructive sleep apnea, bipolar disease/schizophrenia, prior CVA.    Had Mitchell [name] on 09/26/17 with 3v disease and preserved CO/CI. No revascularization options. Saw EP about ICD but did not f/u.    Had been lost to follow-up in HF clinic since 2020.   Patient admitted June 2022 with sepsis secondary to RLE abscess/cellulitis and COVID-19. He ended up requiring R BKA.   Readmitted 04/11/21 with a/c HFrEF and NSTEMI.  He was diuresed with IV Lasix and started on GDMT. L/RHC with severe 3 vessel CAD (no targets for intervention), RA 4, PA 49/17 (31), PCW 15, Fick CO/index 6.7/2.7. He was referred to paramedicine program at discharge. Visual impairment felt to be contributor to issues with medication compliance.   He returned to HF Clinic on 04/20/2021 for [name] follow-up. He had been doing well since discharge. He had no complaints of dyspnea or CP. Denied LE edema. He received a RLE prosthesis on 04/29/2021. He had no complaints of dizziness, presyncope or syncope. Was taking Torsemide and potassium supplement every other day rather than as needed. Otherwise, reported he was taking medications as prescribed. He was concerned about cost of future refills.    Was referred for paramedicine and met with HF CSW after visit on 04/20/2021.   Today he returns to HF clinic for pharmacist medication titration. At last visit with APP, Entresto was increased to 97/103 mg BID. Labs from nephrology visit on 04/19/2021 were received on 9/22/22022 and APP advised him to decrease torsemide and potassium to PRN (as prescribed) due to elevated Scr. He has not taken medications this morning because he did not eat breakfast. Overall patient is feeling great. No dizziness, lightheadedness, or fatigue. No chest pain  or palpitations. He says his breathing is good. Took one dose of torsemide this past Saturday for SOB, this has now resolved. He has not been able to weigh himself at home d/t adjusting to prosthetic. He does not know how to use his BP cuff so he has not been checking BP at home. No LEE or PND/orthopnea. He reports his appetite is great and tries to limit salt and dairy and avoids pork.  HF Medications: Torsemide 20 mg + KCl 20 mEQ PRN Carvedilol 6.25 mg BID Entresto 97/103 mg BID Spironolactone 25 mg daily Farxiga 10 mg daily  Has the patient been experiencing any side effects to the medications prescribed?  NO  Does the patient have any problems obtaining medications due to transportation or finances?  He has [name] and has copay cards for [employer]. Lost Farxiga bottle, samples provided in clinic (quantity: 28 tablets, Lot: [id number], EXP: 11/28/23)   Understanding of regimen: fair Understanding of indications: fair Potential of compliance: good Patient understands to avoid NSAIDs. Patient understands to avoid decongestants.    Pertinent Lab Values: 04/19/2021 (at nephrology visit): Serum creatinine 1.60, BUN 23, Potassium 4.6, Sodium 145 05/12/21: Serum creatinine 1.49, BUN 18, Potassium 4.3, Sodium 143  Vital Signs: Weight: 276.6 lbs (last clinic weight: unable to assess) Blood pressure: 132/84  Heart rate: 89    Assessment/Plan: Chronic systolic HF /Mixed ischemic/nonischemic cardiomyopathy  - EF 20 to 25% on echo in June 2022, stable from prior exams dating back to 2015  - severe 3v CAD on cardiac cath (no targets for revascularization) -  Admitted 04/11/2021 with a/c CHF. Compliance was an issue. Now enrolled in paramedicine. - NYHA IIIa was previously difficult to assess d/t limited mobility after right BKA.  - Continue torsemide 20 mg + KCl 20 mEq PRN - Increase carvedilol to 12.5 mg BID - Continue Entresto 97/103 mg BID - Continue  spironolactone 25 mg daily - Continue Farxiga 10 mg daily - Will need repeat echo at some point once on maximum tolerated doses of GDMT   2. CAD/NSTEMI 03/2021 - High-sensitivity troponins elevated to 11525 during recent admit 03/2021. EKG with no signs of acute ischemia. Cath with progressive 3v CAD as above. No targets for intervention.  - Denies any angina - Continue DAPT with aspirin and plavix, statin and beta blocker.    3. Hypertension - BP above goal. Adjusting medications as above   4. Hyperlipidemia  - Continue statin   5. Type 2 diabetes - Last A1c 8.1 on 01/23/2021 - Continue Farxiga - On insulin, management per PCP   9. PVD s/p R BKA  - Diabetic foot wound complicated by sepsis that led to amputation on 01/25/2021 - Continue aspirin and statin    Follow up 06/01/2021 with Mitchell [name], PharmD PGY-1 [facility] Pharmacy Resident  Audry Riles, PharmD, BCPS, [facility], CPP Heart Failure Clinic Pharmacist [phone]

## 2021-05-12 NOTE — Progress Notes (Signed)
Paramedicine Encounter   Patient ID: Mitchell [name] , male,   DOB: October 27, 1970,50 y.o.,  MRN: [id number]   Met patient in clinic today with provider.  Weight @ clinic w/prosthetic- 276 B/p-132/84 P-89  Met pt with pharmacy team, his carvedilol is being increased to 12.18m BID.  I filled pill box while he was here. He had to go to pharmacy to p/u his amitriptyline.  He was able to fill pill box last week on his own, it just takes him time to do it, but he is able.  Will f/u next week.   KMarylouise Stacks EVancleave10/13/2022

## 2021-05-12 NOTE — Patient Instructions (Signed)
It was a pleasure seeing you today!  MEDICATIONS: -We are changing your medications today -Increase carvedilol to 12.5 mg (1 tablet) twice daily.  -Call if you have questions about your medications.  LABS: -We will call you if your labs need attention.  NEXT APPOINTMENT: Return to clinic in 3 weeks with Pharmacy Clinic.  In general, to take care of your heart failure: -Limit your fluid intake to 2 Liters (half-gallon) per day.   -Limit your salt intake to ideally 2-3 grams (2000-3000 mg) per day. -Weigh yourself daily and record, and bring that "weight diary" to your next appointment.  (Weight gain of 2-3 pounds in 1 day typically means fluid weight.) -The medications for your heart are to help your heart and help you live longer.   -Please contact us before stopping any of your heart medications.  Call the clinic at [phone] with questions or to reschedule future appointments.

## 2021-05-16 ENCOUNTER — Encounter: Payer: Self-pay | Admitting: Physical Therapy

## 2021-05-16 ENCOUNTER — Other Ambulatory Visit: Payer: Self-pay

## 2021-05-16 ENCOUNTER — Encounter: Payer: Self-pay | Admitting: Orthopedic Surgery

## 2021-05-16 ENCOUNTER — Ambulatory Visit (INDEPENDENT_AMBULATORY_CARE_PROVIDER_SITE_OTHER): Payer: No Typology Code available for payment source | Admitting: Physical Therapy

## 2021-05-16 ENCOUNTER — Ambulatory Visit (INDEPENDENT_AMBULATORY_CARE_PROVIDER_SITE_OTHER): Payer: No Typology Code available for payment source | Admitting: Orthopedic Surgery

## 2021-05-16 DIAGNOSIS — M6281 Muscle weakness (generalized): Secondary | ICD-10-CM | POA: Diagnosis not present

## 2021-05-16 DIAGNOSIS — R2681 Unsteadiness on feet: Secondary | ICD-10-CM | POA: Diagnosis not present

## 2021-05-16 DIAGNOSIS — Z89511 Acquired absence of right leg below knee: Secondary | ICD-10-CM

## 2021-05-16 DIAGNOSIS — R2689 Other abnormalities of gait and mobility: Secondary | ICD-10-CM | POA: Diagnosis not present

## 2021-05-16 DIAGNOSIS — R293 Abnormal posture: Secondary | ICD-10-CM

## 2021-05-16 DIAGNOSIS — Z9181 History of falling: Secondary | ICD-10-CM

## 2021-05-16 NOTE — Progress Notes (Signed)
Office Visit Note   Patient: Mitchell [name]           Date of Birth: 1971-03-25           MRN: [id number] Visit Date: 05/16/2021              Requested by: Pearline Cables, MD [address] [place],  [place] [address] PCP: Pearline Cables, MD  Chief Complaint  Patient presents with   Right Leg - Follow-up    01/25/21 right BKA      50-year-old gentleman who is 4 months status post right transtibial amputation.  He is currently wearing a prosthesis ambulating with a walker.  Patient is working with Zella Ball.  Patient states he has been having pain with using the gray shrinker at night. HPI: Patient is a  Assessment & Plan: Visit Diagnoses:  1. S/P below knee amputation, right (HCC)     Plan: Recommend that he continue using the black shrinker discontinue the great shrinker.  Follow-Up Instructions: Return in about 4 weeks (around 06/13/2021).   Ortho Exam  Patient is alert, oriented, no adenopathy, well-dressed, normal affect, normal respiratory effort. Examination patient is now using 2 layers of socks over his liner.  There are no open ulcers no drainage no cellulitis no signs of infection.  The residual limb is nontender to palpation.  Imaging: No results found. No images are attached to the encounter.  Labs: Lab Results  Component Value Date   HGBA1C 7.8 (H) 04/11/2021   HGBA1C 8.1 (H) 01/23/2021   HGBA1C 8.1 (H) 08/04/2020   ESRSEDRATE 81 (H) 10/21/2019   ESRSEDRATE 25 (H) 06/17/2018   ESRSEDRATE 29 (H) 05/28/2018   CRP 10.3 (H) 01/28/2021   CRP 12.8 (H) 01/27/2021   CRP 21.1 (H) 01/26/2021   REPTSTATUS 01/25/2021 FINAL 01/24/2021   GRAMSTAIN  08/29/2018    RARE WBC PRESENT, PREDOMINANTLY PMN NO ORGANISMS SEEN    CULT  01/24/2021    NO GROWTH Performed at [facility], [address], [place], [place] [address]    LABORGA KLEBSIELLA PNEUMONIAE 05/21/2018   LABORGA ENTEROBACTER SPECIES 05/21/2018   LABORGA STAPHYLOCOCCUS AUREUS  05/21/2018     Lab Results  Component Value Date   ALBUMIN 2.9 (L) 04/11/2021   ALBUMIN 2.3 (L) 02/08/2021   ALBUMIN 2.2 (L) 02/05/2021   PREALBUMIN 6.8 (L) 10/21/2019    Lab Results  Component Value Date   MG 1.5 (L) 04/12/2021   MG 1.8 02/16/2021   MG 1.9 02/14/2021   Lab Results  Component Value Date   VD25OH 11 (L) 08/16/2009    Lab Results  Component Value Date   PREALBUMIN 6.8 (L) 10/21/2019   CBC EXTENDED Latest Ref Rng & Units 04/13/2021 04/12/2021 04/12/2021  WBC 4.0 - 10.5 K/uL 8.2 9.3 -  RBC 4.22 - 5.81 MIL/uL 3.61(L) 4.00(L) -  HGB 13.0 - 17.0 g/dL 10.8(L) 11.6(L) 11.6(L)  HCT 39.0 - 52.0 % 32.8(L) 36.3(L) 34.0(L)  PLT 150 - 400 K/uL 213 235 -  NEUTROABS 1.7 - 7.7 K/uL - - -  LYMPHSABS 0.7 - 4.0 K/uL - - -     There is no height or weight on file to calculate BMI.  Orders:  No orders of the defined types were placed in this encounter.  No orders of the defined types were placed in this encounter.    Procedures: No procedures performed  Clinical Data: No additional findings.  ROS:  All other systems  negative, except as noted in the HPI. Review of Systems  Objective: Vital Signs: There were no vitals taken for this visit.  Specialty Comments:  No specialty comments available.  PMFS History: Patient Active Problem List   Diagnosis Date Noted   Acute on chronic HFrEF (heart failure with reduced ejection fraction) (HCC) 04/11/2021   CAD (coronary artery disease) 04/11/2021   S/P below knee amputation, right (HCC) 02/07/2021   Cutaneous abscess of right foot    Diabetic foot infection (HCC)    SOB (shortness of breath)    Sepsis (HCC) 01/23/2021   Cellulitis 01/23/2021   Elevated troponin 01/23/2021   Acute metabolic encephalopathy 01/23/2021   AKI (acute kidney injury) (HCC) 01/23/2021   COVID-19 virus infection 01/23/2021   Wound infection 10/21/2019   Burn, foot, second degree, unspecified laterality, sequela 10/21/2019   QT  prolongation 07/30/2019   Acute exacerbation of CHF (congestive heart failure) (HCC) 07/30/2019   Acute on chronic combined systolic and diastolic CHF (congestive heart failure) (HCC) 07/29/2019   Foot ulcer (HCC) 03/11/2018   Weakness 05/08/2017   Leukocytosis 05/08/2017   Stroke (HCC) 05/08/2017   Slurred speech 05/08/2017   Uncontrolled type 2 diabetes mellitus with hyperglycemia, with long-term current use of insulin (HCC) 12/07/2015   Noncompliance 01/22/2014   Chronic systolic heart failure (HCC) 10/02/2011   Erectile dysfunction 10/02/2011   Obstructive sleep apnea 10/11/2007   Diabetes mellitus with complication (HCC) 10/10/2007   HLD (hyperlipidemia) 10/10/2007   HYPOKALEMIA 10/10/2007   Obesity, Class III, BMI 40-49.9 (morbid obesity) (HCC) 10/10/2007   Essential hypertension 10/10/2007   PREMATURE VENTRICULAR CONTRACTIONS 10/10/2007   SYNCOPE 10/10/2007   COLONIC POLYPS, HX OF 10/10/2007   Past Medical History:  Diagnosis Date   Bipolar disorder (HCC)    CAD (coronary artery disease)    a. diffuse 3v CAD by cath 2019, medical therapy recommended.   Cataract    forming    Chronic systolic CHF (congestive heart failure) (HCC)    CVA (cerebral infarction)    No residual deficits   Depression    PTSD,    Diabetes mellitus    TYPE II; UNCONTROLLED BY HEMOGLOBIN A1c; STABLE AS  PER DISCHARGE   Headache(784.0)    Herpes simplex of male genitalia    History of colonic polyps    Hyperlipidemia    Hypertension    Myocardial infarction (HCC) 1987   (while playing football)   Obesity    OSA (obstructive sleep apnea)    repeat study 2018 without significant OSA   Pneumonia    Post-cardiac injury syndrome (HCC)    History of cardiac injury from blunt trauma   Pulmonary hypertension (HCC)    a. moderately elevated PASP 07/2019.   PVCs (premature ventricular contractions)    Schizophrenia (HCC)    Goes to [facility]   Sleep apnea    Stroke [facility])  2005   some left side weakness   Syncope    Recurrent, thought to be vasovagal. Also has h/o frequent PVCs.     Family History  Problem Relation Age of Onset   Heart disease Mother        MI   Heart failure Mother    Diabetes Mother        ALSO IN MOST OF HIS SIBLINGS; 2 UNLCES HAVE ALSO PASSED AWAY FROM DM   Cardiomyopathy Mother    Cancer - Ovarian Mother    Ovarian cancer Mother    Heart disease Father  Hypertension Father    Diabetes Father    Diabetes Sister    Diabetes Brother    Colon cancer Paternal Uncle    Colon cancer Paternal Uncle    Colon polyps Neg Hx    Esophageal cancer Neg Hx    Rectal cancer Neg Hx    Stomach cancer Neg Hx     Past Surgical History:  Procedure Laterality Date   AMPUTATION Right 01/25/2021   Procedure: RIGHT BELOW KNEE AMPUTATION;  Surgeon: Nadara Mustard, MD;  Location: [facility] OR;  Service: Orthopedics;  Laterality: Right;   CARDIAC CATHETERIZATION  12/19/10   DIFFUSE NONOBSTRUCTIVE CAD; NONISCHEMIC CARDIOMYOPATHY; LEFT VENTRICULAR ANGIOGRAM WAS PERFORMED SECONDARY TO  ELEVATED LEFT VENTRICULAR FILLING PRESSURES   COLONOSCOPY     ~ age [age]   COLONOSCOPY W/ POLYPECTOMY     I & D EXTREMITY Bilateral 10/24/2019   Procedure: IRRIGATION AND DEBRIDEMENT BILATERAL EXTREMITY WOUND ON FOOT;  Surgeon: Allena Napoleon, MD;  Location: [facility] OR;  Service: Plastics;  Laterality: Bilateral;   METATARSAL HEAD EXCISION Right 08/29/2018   Procedure: METATARSAL HEAD RESECTION;  Surgeon: Felecia Shelling, DPM;  Location: [facility] OR;  Service: Podiatry;  Laterality: Right;   METATARSAL OSTEOTOMY Right 08/29/2018   Procedure: SUB FIFTHE METATARSIA RIGHT FOOT;  Surgeon: Felecia Shelling, DPM;  Location: [facility] OR;  Service: Podiatry;  Laterality: Right;   MULTIPLE EXTRACTIONS WITH ALVEOLOPLASTY  01/27/2014   "all my teeth; 4 Quadrants of alveoloplasty   MULTIPLE EXTRACTIONS WITH ALVEOLOPLASTY N/A 01/27/2014   Procedure: EXTRACTION OF TEETH #'1, 2, 3, 4, 5, 6, 7, 8, 9, 10, 11, 12,  13, 14, 15, 16, 17, 20, 21, 22, 23, 24, 25, 26, 27, 28, 29, 31 and 32 WITH ALVEOLOPLASTY;  Surgeon: Charlynne Pander, DDS;  Location: [facility] OR;  Service: Oral Surgery;  Laterality: N/A;   ORIF FINGER / THUMB FRACTURE Right    POLYPECTOMY     RIGHT HEART CATH N/A 01/27/2021   Procedure: RIGHT HEART CATH;  Surgeon: Dolores Patty, MD;  [facility]: [facility];  Service: Cardiovascular;  Laterality: N/A;   RIGHT/LEFT HEART CATH AND CORONARY ANGIOGRAPHY N/A 09/26/2017   Procedure: RIGHT/LEFT HEART CATH AND CORONARY ANGIOGRAPHY;  Surgeon: Dolores Patty, MD;  [facility];  Service: Cardiovascular;  Laterality: N/A;   RIGHT/LEFT HEART CATH AND CORONARY ANGIOGRAPHY N/A 04/12/2021   Procedure: RIGHT/LEFT HEART CATH AND CORONARY ANGIOGRAPHY;  Surgeon: Dolores Patty, MD;  [facility];  Service: Cardiovascular;  Laterality: N/A;   SKIN SPLIT GRAFT Bilateral 10/24/2019   Procedure: SKIN GRAFT SPLIT THICKNESS LEFT THIGH;  Surgeon: Allena Napoleon, MD;  Location: [facility];  Service: Plastics;  Laterality: Bilateral;   WOUND DEBRIDEMENT Right 08/29/2018   Procedure: Debridement of ulcer on right fifth metatarsal;  Surgeon: Felecia Shelling, DPM;  Location: [facility];  Service: Podiatry;  Laterality: Right;   Social History   Occupational History   Occupation: Mortician  Tobacco Use   Smoking status: Never   Smokeless tobacco: Never  Vaping Use   Vaping Use: Never used  Substance and Sexual Activity   Alcohol use: No   Drug use: No   Sexual activity: Not on file

## 2021-05-16 NOTE — Therapy (Signed)
[facility] Physical Therapy [address] [place], [place], [address] Phone: [phone]   Fax:  [phone]  Physical Therapy Treatment  Patient Details  Name: Mitchell [name] MRN: [id number] Date of Birth: Feb 16, 1971 Referring Provider (PT): Aldean Baker, MD   Encounter Date: 05/16/2021   PT End of Session - 05/16/21 0923     Visit Number 3    Number of Visits 25    Date for PT Re-Evaluation 07/28/21    Authorization Type [employer] / [employer] $15 copay no visit limit    PT Start Time 0923    PT Stop Time 1010    PT Time Calculation (min) 47 min    Equipment Utilized During Treatment Gait belt    Activity Tolerance Patient tolerated treatment well;Patient limited by fatigue    Behavior During Therapy [facility] for tasks assessed/performed             Past Medical History:  Diagnosis Date   Bipolar disorder (HCC)    CAD (coronary artery disease)    a. diffuse 3v CAD by cath 2019, medical therapy recommended.   Cataract    forming    Chronic systolic CHF (congestive heart failure) (HCC)    CVA (cerebral infarction)    No residual deficits   Depression    PTSD,    Diabetes mellitus    TYPE II; UNCONTROLLED BY HEMOGLOBIN A1c; STABLE AS  PER DISCHARGE   Headache(784.0)    Herpes simplex of male genitalia    History of colonic polyps    Hyperlipidemia    Hypertension    Myocardial infarction (HCC) 1987   (while playing football)   Obesity    OSA (obstructive sleep apnea)    repeat study 2018 without significant OSA   Pneumonia    Post-cardiac injury syndrome (HCC)    History of cardiac injury from blunt trauma   Pulmonary hypertension (HCC)    a. moderately elevated PASP 07/2019.   PVCs (premature ventricular contractions)    Schizophrenia (HCC)    Goes to [facility]   Sleep apnea    Stroke [facility]) 2005   some left side weakness   Syncope    Recurrent, thought to be vasovagal. Also has h/o frequent PVCs.     Past Surgical History:   Procedure Laterality Date   AMPUTATION Right 01/25/2021   Procedure: RIGHT BELOW KNEE AMPUTATION;  Surgeon: Nadara Mustard, MD;  Location: [facility] OR;  Service: Orthopedics;  Laterality: Right;   CARDIAC CATHETERIZATION  12/19/10   DIFFUSE NONOBSTRUCTIVE CAD; NONISCHEMIC CARDIOMYOPATHY; LEFT VENTRICULAR ANGIOGRAM WAS PERFORMED SECONDARY TO  ELEVATED LEFT VENTRICULAR FILLING PRESSURES   COLONOSCOPY     ~ age [age]   COLONOSCOPY W/ POLYPECTOMY     I & D EXTREMITY Bilateral 10/24/2019   Procedure: IRRIGATION AND DEBRIDEMENT BILATERAL EXTREMITY WOUND ON FOOT;  Surgeon: Allena Napoleon, MD;  Location: [facility];  Service: Plastics;  Laterality: Bilateral;   METATARSAL HEAD EXCISION Right 08/29/2018   Procedure: METATARSAL HEAD RESECTION;  Surgeon: Felecia Shelling, DPM;  Location: [facility];  Service: Podiatry;  Laterality: Right;   METATARSAL OSTEOTOMY Right 08/29/2018   Procedure: SUB FIFTHE METATARSIA RIGHT FOOT;  Surgeon: Felecia Shelling, DPM;  Location: [facility];  Service: Podiatry;  Laterality: Right;   MULTIPLE EXTRACTIONS WITH ALVEOLOPLASTY  01/27/2014   "all my teeth; 4 Quadrants of alveoloplasty   MULTIPLE EXTRACTIONS WITH ALVEOLOPLASTY N/A 01/27/2014   Procedure: EXTRACTION OF TEETH #'1, 2, 3, 4, 5, 6, 7,  8, 9, 10, 11, 12, 13, 14, 15, 16, 17, 20, 21, 22, 23, 24, 25, 26, 27, 28, 29, 31 and 32 WITH ALVEOLOPLASTY;  Surgeon: Charlynne Pander, DDS;  Location: [facility];  Service: Oral Surgery;  Laterality: N/A;   ORIF FINGER / THUMB FRACTURE Right    POLYPECTOMY     RIGHT HEART CATH N/A 01/27/2021   Procedure: RIGHT HEART CATH;  Surgeon: Dolores Patty, MD;  [facility];  Service: Cardiovascular;  Laterality: N/A;   RIGHT/LEFT HEART CATH AND CORONARY ANGIOGRAPHY N/A 09/26/2017   Procedure: RIGHT/LEFT HEART CATH AND CORONARY ANGIOGRAPHY;  Surgeon: Dolores Patty, MD;  [facility];  Service: Cardiovascular;  Laterality: N/A;   RIGHT/LEFT HEART CATH AND CORONARY ANGIOGRAPHY N/A  04/12/2021   Procedure: RIGHT/LEFT HEART CATH AND CORONARY ANGIOGRAPHY;  Surgeon: Dolores Patty, MD;  [facility];  Service: Cardiovascular;  Laterality: N/A;   SKIN SPLIT GRAFT Bilateral 10/24/2019   Procedure: SKIN GRAFT SPLIT THICKNESS LEFT THIGH;  Surgeon: Allena Napoleon, MD;  Location: [facility];  Service: Plastics;  Laterality: Bilateral;   WOUND DEBRIDEMENT Right 08/29/2018   Procedure: Debridement of ulcer on right fifth metatarsal;  Surgeon: Felecia Shelling, DPM;  Location: [facility];  Service: Podiatry;  Laterality: Right;    There were no vitals filed for this visit.   Subjective Assessment - 05/16/21 0923     Subjective He is wearing prosthesis 3hrs 3x/day without issues.  Prosthetist replaced flexible inner liner.  His sleep has improved significantly with using only Vivewear shrinker at night and both Vivewear & tubular shrinkers during day.    Pertinent History NSTEMI 9/12/022, HFrEF, ischemic cardiomyopathy EF 20-25%,, CAD, HTN, hyperlipidemia, DM2, PVD s/p right BKA, COVID-19 in June/2022, schizophrenia, prior CVA    Patient Stated Goals He would like to use prosthesis for community    Currently in Pain? No/denies                               [facility] Adult PT Treatment/Exercise - 05/16/21 0924       Transfers   Transfers Sit to Stand;Stand to Sit    Sit to Stand 5: Supervision;With upper extremity assist;With armrests;From chair/3-in-1;Other (comment)   to RW or counter   Stand to Sit 5: Supervision;With upper extremity assist;With armrests;To chair/3-in-1;Other (comment)   from RW or counter     Ambulation/Gait   Ambulation/Gait Yes    Ambulation/Gait Assistance 5: Supervision    Ambulation/Gait Assistance Details PT demo & verbal cues on step width & wt shift over prosthesis in stance.    Ambulation Distance (Feet) 100 Feet   100' X 2   Assistive device Rolling walker;Prosthesis    Ambulation Surface Level;Indoor    Ramp 5:  Supervision   RW & TTA prosthesis   Ramp Details (indicate cue type and reason) PT demo & verbal cues on technique including wt shift over prosthesis in stance    Curb 5: Supervision   RW & TTA prosthesis   Curb Details (indicate cue type and reason) PT demo & verbal cues on technique      Neuro Re-ed    Neuro Re-ed Details  pt reports HEP is going well      Prosthetics   Prosthetic Care Comments  PT instructed in correct ply socks donning & walking with too few, too many & correct ply fit.(see pt instructions  which PT printed in large bold print due to visual issues).  PT continued wear at 3hrs 3 x/day with prosthesis off 2hrs bw wears.  Residual limb circumference 47cm at calf which is XL now not XXL.  pt reports unable to sleep with both Vivewear shrinker & gray tublar shrinker.  PT recommended only Vivewear at night and both during day any time not wearing prosthesis.    Current prosthetic wear tolerance (days/week)  daily since PT eval    Current prosthetic wear tolerance (#hours/day)  3 hrs 3x/day    Current prosthetic weight-bearing tolerance (hours/day)  Patient tolerated standing 5 minutes & gait with RW for PWB on prosthesis with no c/o limb discomfort    Residual limb condition  3 wounds (lateral 2 are 1cm & medial one is 2 cm X 1cm) dry scabs, no hair growth, normal moisture & temperature, intermittent phantom pain, no limb pain, cylinderical shape    Education Provided Residual limb care;Prosthetic cleaning;Proper wear schedule/adjustment;Other (comment);Correct ply sock adjustment   see prosthetic care comments   Person(s) Educated Patient    Education Method Explanation;Demonstration;Tactile cues;Verbal cues;Handout    Education Method Verbalized understanding;Tactile cues required;Verbal cues required;Needs further instruction                       PT Short Term Goals - 05/04/21 1253       PT SHORT TERM GOAL #1   Title Patient donnes prosthesis modified  independent & verbalizes proper cleaning.    Time 4    Period Weeks    Status New    Target Date 06/02/21      PT SHORT TERM GOAL #2   Title Patient tolerates prosthesis >10 hrs total /day without skin issues increasing.    Time 4    Period Weeks    Status New    Target Date 06/02/21      PT SHORT TERM GOAL #3   Title Patient able to reach 7" and look over both shoulders without UE support with supervision    Time 4    Period Weeks    Status New    Target Date 06/02/21      PT SHORT TERM GOAL #4   Title Patient ambulates 200' with RW & prosthesis with supervision verbal cues only for deviation no loss of balance    Time 4    Period Weeks    Status New    Target Date 06/02/21      PT SHORT TERM GOAL #5   Title Patient negotiates ramps & curbs with RW & prosthesis with supervision.    Time 4    Period Weeks    Status New    Target Date 06/02/21               PT Long Term Goals - 05/04/21 1140       PT LONG TERM GOAL #1   Title Patient demonstrates & verbalized understanding of prosthetic care to enable safe utilization of prosthesis.    Time 12    Period Weeks    Status New    Target Date 07/28/21      PT LONG TERM GOAL #2   Title Patient tolerates prosthesis wear >90% of awake hours without skin or limb pain issues.    Time 12    Period Weeks    Status New    Target Date 07/28/21      PT LONG TERM GOAL #3  Title Programmer, systems >/= 45/56 to indicate lower fall risk    Time 12    Period Weeks    Status New    Target Date 07/28/21      PT LONG TERM GOAL #4   Title Patient ambulates 300' with LRAD & prosthesis modified independent    Time 12    Period Weeks    Status New    Target Date 07/28/21      PT LONG TERM GOAL #5   Title Patient negotiates ramps, curbs & stairs with LRAD & prosthesis modified independent.    Time 12    Period Weeks    Status New    Target Date 07/28/21                   Plan - 05/16/21 2993     Clinical  Impression Statement PT worked on how to adjust ply socks which he has basic understanding. PT also instructed in prosthetic gait including ramps & curbs which improved.    Personal Factors and Comorbidities Comorbidity 3+;Fitness;Time since onset of injury/illness/exacerbation    Comorbidities NSTEMI 9/12/022, HFrEF, ischemic cardiomyopathy EF 20-25%,, CAD, HTN, hyperlipidemia, DM2, PVD s/p right BKA, COVID-19 in June/2022, schizophrenia, prior CVA    Examination-Activity Limitations Lift;Locomotion Level;Reach Overhead;Squat;Stairs;Stand;Transfers    Examination-Participation Restrictions Community Activity    Stability/Clinical Decision Making Evolving/Moderate complexity    Rehab Potential Good    PT Frequency 2x / week    PT Duration 12 weeks    PT Treatment/Interventions ADLs/Self Care Home Management;DME Instruction;Gait training;Stair training;Functional mobility training;Therapeutic activities;Therapeutic exercise;Balance training;Neuromuscular re-education;Patient/family education;Prosthetic Training;Passive range of motion;Vestibular    PT Next Visit Plan monitor HR with activities, review prosthetic care, check wounds if no increase in size or integrity then increase wear time weekly, begin exercises to work on strength, endurance & balance    PT Home Exercise Plan HEP at sink see pt instructions 05/10/2021    Consulted and Agree with Plan of Care Patient             Patient will benefit from skilled therapeutic intervention in order to improve the following deficits and impairments:  Abnormal gait, Decreased activity tolerance, Decreased balance, Decreased coordination, Decreased endurance, Decreased knowledge of use of DME, Decreased mobility, Decreased skin integrity, Decreased strength, Increased edema, Postural dysfunction, Prosthetic Dependency, Obesity  Visit Diagnosis: Other abnormalities of gait and mobility  Unsteadiness on feet  Muscle weakness  (generalized)  Abnormal posture  History of falling     Problem List Patient Active Problem List   Diagnosis Date Noted   Acute on chronic HFrEF (heart failure with reduced ejection fraction) (HCC) 04/11/2021   CAD (coronary artery disease) 04/11/2021   S/P below knee amputation, right (HCC) 02/07/2021   Cutaneous abscess of right foot    Diabetic foot infection (HCC)    SOB (shortness of breath)    Sepsis (HCC) 01/23/2021   Cellulitis 01/23/2021   Elevated troponin 01/23/2021   Acute metabolic encephalopathy 01/23/2021   AKI (acute kidney injury) (HCC) 01/23/2021   COVID-19 virus infection 01/23/2021   Wound infection 10/21/2019   Burn, foot, second degree, unspecified laterality, sequela 10/21/2019   QT prolongation 07/30/2019   Acute exacerbation of CHF (congestive heart failure) (HCC) 07/30/2019   Acute on chronic combined systolic and diastolic CHF (congestive heart failure) (HCC) 07/29/2019   Foot ulcer (HCC) 03/11/2018   Weakness 05/08/2017   Leukocytosis 05/08/2017   Stroke (HCC) 05/08/2017   Slurred speech 05/08/2017  Uncontrolled type 2 diabetes mellitus with hyperglycemia, with long-term current use of insulin (HCC) 12/07/2015   Noncompliance 01/22/2014   Chronic systolic heart failure (HCC) 10/02/2011   Erectile dysfunction 10/02/2011   Obstructive sleep apnea 10/11/2007   Diabetes mellitus with complication (HCC) 10/10/2007   HLD (hyperlipidemia) 10/10/2007   HYPOKALEMIA 10/10/2007   Obesity, Class III, BMI 40-49.9 (morbid obesity) (HCC) 10/10/2007   Essential hypertension 10/10/2007   PREMATURE VENTRICULAR CONTRACTIONS 10/10/2007   SYNCOPE 10/10/2007   COLONIC POLYPS, HX OF 10/10/2007    Vladimir Faster, PT, DPT 05/16/2021, 10:24 AM  [facility] [address] [place], [place], [address] Phone: [phone]   Fax:  [phone]  Name: Mitchell [name] MRN: [id number] Date of Birth: 1970/12/22

## 2021-05-16 NOTE — Patient Instructions (Signed)
Hanger Socks: 1-ply is yellow color at top, 3-ply is green at top, 5-ply is navy blue at top How many ply you need depends on your limb size.  You should have even pressure on your limb when standing & walking.  Guidance points: 1. How ease it goes on? Should be some resistance. Too few it goes on too easily. Too many it takes a lot of work to get it on. 2. How many clicks you get. Especially clicks in sitting. 3. After standing or walking, check knee cap. Bottom should be just under the front lip.  Too few bottom of knee cap sits on indention. Too many bottom is above front lip. 4. Have your feet beside each other & hips over feet. Place hands on your waist. Pelvis Should be level. Too few prosthetic side will be low. Too many prosthetic side will be high.    Get ply socks correct before you leave the house. Take extra socks with you. Take one 3-ply and two 1-ply with you. This is in addition to what you are wearing.   

## 2021-05-17 ENCOUNTER — Telehealth (HOSPITAL_COMMUNITY): Payer: Self-pay

## 2021-05-17 LAB — HM DIABETES EYE EXAM

## 2021-05-17 NOTE — Telephone Encounter (Signed)
Contacted pt today to f/u, he reports he is on the way to an appointment now, he is going out of town tomor and would like for me to f/u next week.  He denies any issues or complaints at this time since med changes last week.   Kerry Hough, EMT-Paramedic  05/17/21

## 2021-05-18 ENCOUNTER — Ambulatory Visit (INDEPENDENT_AMBULATORY_CARE_PROVIDER_SITE_OTHER): Payer: No Typology Code available for payment source | Admitting: Physical Therapy

## 2021-05-18 ENCOUNTER — Other Ambulatory Visit: Payer: Self-pay

## 2021-05-18 ENCOUNTER — Encounter: Payer: Self-pay | Admitting: Physical Therapy

## 2021-05-18 DIAGNOSIS — R2681 Unsteadiness on feet: Secondary | ICD-10-CM

## 2021-05-18 DIAGNOSIS — R2689 Other abnormalities of gait and mobility: Secondary | ICD-10-CM | POA: Diagnosis not present

## 2021-05-18 DIAGNOSIS — R293 Abnormal posture: Secondary | ICD-10-CM | POA: Diagnosis not present

## 2021-05-18 DIAGNOSIS — M6281 Muscle weakness (generalized): Secondary | ICD-10-CM | POA: Diagnosis not present

## 2021-05-18 DIAGNOSIS — Z9181 History of falling: Secondary | ICD-10-CM

## 2021-05-18 NOTE — Therapy (Signed)
[facility] [address] [place], [place], [address] Phone: [phone]   Fax:  [phone]  Physical Therapy Treatment  Patient Details  Name: Mitchell [name] MRN: [id number] Date of Birth: 1971-01-19 Referring Provider (PT): Aldean Baker, MD   Encounter Date: 05/18/2021   PT End of Session - 05/18/21 1008     Visit Number 4    Number of Visits 25    Date for PT Re-Evaluation 07/28/21    Authorization Type [employer] / [employer] $15 copay no visit limit    PT Start Time 1008    PT Stop Time 1053    PT Time Calculation (min) 45 min    Equipment Utilized During Treatment Gait belt    Activity Tolerance Patient tolerated treatment well;Patient limited by fatigue    Behavior During Therapy WFL for tasks assessed/performed             Past Medical History:  Diagnosis Date   Bipolar disorder (HCC)    CAD (coronary artery disease)    a. diffuse 3v CAD by cath 2019, medical therapy recommended.   Cataract    forming    Chronic systolic CHF (congestive heart failure) (HCC)    CVA (cerebral infarction)    No residual deficits   Depression    PTSD,    Diabetes mellitus    TYPE II; UNCONTROLLED BY HEMOGLOBIN A1c; STABLE AS  PER DISCHARGE   Headache(784.0)    Herpes simplex of male genitalia    History of colonic polyps    Hyperlipidemia    Hypertension    Myocardial infarction (HCC) 1987   (while playing football)   Obesity    OSA (obstructive sleep apnea)    repeat study 2018 without significant OSA   Pneumonia    Post-cardiac injury syndrome (HCC)    History of cardiac injury from blunt trauma   Pulmonary hypertension (HCC)    a. moderately elevated PASP 07/2019.   PVCs (premature ventricular contractions)    Schizophrenia (HCC)    Goes to [facility]   Sleep apnea    Stroke [facility]) 2005   some left side weakness   Syncope    Recurrent, thought to be vasovagal. Also has h/o frequent PVCs.     Past Surgical History:   Procedure Laterality Date   AMPUTATION Right 01/25/2021   Procedure: RIGHT BELOW KNEE AMPUTATION;  Surgeon: Nadara Mustard, MD;  Location: [facility] OR;  Service: Orthopedics;  Laterality: Right;   CARDIAC CATHETERIZATION  12/19/10   DIFFUSE NONOBSTRUCTIVE CAD; NONISCHEMIC CARDIOMYOPATHY; LEFT VENTRICULAR ANGIOGRAM WAS PERFORMED SECONDARY TO  ELEVATED LEFT VENTRICULAR FILLING PRESSURES   COLONOSCOPY     ~ age [age]   COLONOSCOPY W/ POLYPECTOMY     I & D EXTREMITY Bilateral 10/24/2019   Procedure: IRRIGATION AND DEBRIDEMENT BILATERAL EXTREMITY WOUND ON FOOT;  Surgeon: Allena Napoleon, MD;  Location: [facility] OR;  Service: Plastics;  Laterality: Bilateral;   METATARSAL HEAD EXCISION Right 08/29/2018   Procedure: METATARSAL HEAD RESECTION;  Surgeon: Felecia Shelling, DPM;  Location: [facility] OR;  Service: Podiatry;  Laterality: Right;   METATARSAL OSTEOTOMY Right 08/29/2018   Procedure: SUB FIFTHE METATARSIA RIGHT FOOT;  Surgeon: Felecia Shelling, DPM;  Location: [facility] OR;  Service: Podiatry;  Laterality: Right;   MULTIPLE EXTRACTIONS WITH ALVEOLOPLASTY  01/27/2014   "all my teeth; 4 Quadrants of alveoloplasty   MULTIPLE EXTRACTIONS WITH ALVEOLOPLASTY N/A 01/27/2014   Procedure: EXTRACTION OF TEETH #'1, 2, 3, 4, 5, 6, 7,  8, 9, 10, 11, 12, 13, 14, 15, 16, 17, 20, 21, 22, 23, 24, 25, 26, 27, 28, 29, 31 and 32 WITH ALVEOLOPLASTY;  Surgeon: Charlynne Pander, DDS;  Location: [facility] OR;  Service: Oral Surgery;  Laterality: N/A;   ORIF FINGER / THUMB FRACTURE Right    POLYPECTOMY     RIGHT HEART CATH N/A 01/27/2021   Procedure: RIGHT HEART CATH;  Surgeon: Dolores Patty, MD;  Location: [facility];  Service: Cardiovascular;  Laterality: N/A;   RIGHT/LEFT HEART CATH AND CORONARY ANGIOGRAPHY N/A 09/26/2017   Procedure: RIGHT/LEFT HEART CATH AND CORONARY ANGIOGRAPHY;  Surgeon: Dolores Patty, MD;  Location: [facility];  Service: Cardiovascular;  Laterality: N/A;   RIGHT/LEFT HEART CATH AND CORONARY ANGIOGRAPHY N/A  04/12/2021   Procedure: RIGHT/LEFT HEART CATH AND CORONARY ANGIOGRAPHY;  Surgeon: Dolores Patty, MD;  Location: [facility];  Service: Cardiovascular;  Laterality: N/A;   SKIN SPLIT GRAFT Bilateral 10/24/2019   Procedure: SKIN GRAFT SPLIT THICKNESS LEFT THIGH;  Surgeon: Allena Napoleon, MD;  Location: [facility] OR;  Service: Plastics;  Laterality: Bilateral;   WOUND DEBRIDEMENT Right 08/29/2018   Procedure: Debridement of ulcer on right fifth metatarsal;  Surgeon: Felecia Shelling, DPM;  Location: [facility] OR;  Service: Podiatry;  Laterality: Right;    There were no vitals filed for this visit.   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  He is going to [place] today which is a 9hr drive.    Pertinent History NSTEMI 9/12/022, HFrEF, ischemic cardiomyopathy EF 20-25%,, CAD, HTN, hyperlipidemia, DM2, PVD s/p right BKA, COVID-19 in June/2022, schizophrenia, prior CVA    Patient Stated Goals He would like to use prosthesis for community    Currently in Pain? No/denies                               [facility] Adult PT Treatment/Exercise - 05/18/21 1008       Transfers   Transfers Sit to Stand;Stand to Sit    Sit to Stand 5: Supervision;With upper extremity assist;With armrests;From chair/3-in-1;Other (comment)   to RW or counter   Stand to Sit 5: Supervision;With upper extremity assist;With armrests;To chair/3-in-1;Other (comment)   from RW or counter     Ambulation/Gait   Ambulation/Gait Yes    Ambulation/Gait Assistance 5: Supervision    Ambulation/Gait Assistance Details reviewed proper step width & upright trunk    Ambulation Distance (Feet) 100 Feet   100' X 2   Assistive device Rolling walker;Prosthesis    Ramp --    Curb --    Gait Comments HR & SpO2 within normal limits with activities      Self-Care   Self-Care ADL's;Other Self-Care Comments    ADL's donning & doffing jacket and pulling pants up from ankles standing with  chair behind & RW in front. pt return demo without LOB.    Other Self-Care Comments  PT instructed in need to walk every 2 hours when traveling & performing LLE ankle A-Z exercise to decrease risk of DVT & PE. pt verbalized understanding.      Exercises   Exercises Knee/Hip      Knee/Hip Exercises: Standing   Rocker Board 1 minute   ant/level/post & right/level/left, BUE support //bars lightly   Rocker Board Limitations demo, verbal, tactile & visual (mirror) for techique.  working on wt  shift and stabilization      Knee/Hip Exercises: Seated   Sit to Sand 1 set;10 reps;without UE support   from 14" bar stool, not touching //bars to stabilize     Prosthetics   Prosthetic Care Comments  PT educated on driving options with Rt TTA.  PT demo & educated on donning with long pants.  PT instructed to increase wear to 4hrs 3x/day with off 2hrs.  Initiate first wear upon arising and 3rd wear only until ready for bed.    Current prosthetic wear tolerance (days/week)  daily since PT eval    Current prosthetic wear tolerance (#hours/day)  3 hrs 3x/day    Current prosthetic weight-bearing tolerance (hours/day)  Patient tolerated standing 5 minutes & gait with RW for PWB on prosthesis with no c/o limb discomfort    Residual limb condition  3 wounds (lateral 2 are 1cm & medial one is 2 cm X 1cm) dry scabs, no hair growth, normal moisture & temperature, intermittent phantom pain, no limb pain, cylinderical shape    Education Provided Residual limb care;Prosthetic cleaning;Proper wear schedule/adjustment;Other (comment);Correct ply sock adjustment;Proper Donning   see prosthetic care comments   Person(s) Educated Patient    Education Method Explanation;Demonstration;Tactile cues;Verbal cues;Handout    Education Method Verbalized understanding;Returned demonstration;Tactile cues required;Verbal cues required;Needs further instruction    Donning Prosthesis Supervision                       PT  Short Term Goals - 05/04/21 1253       PT SHORT TERM GOAL #1   Title Patient donnes prosthesis modified independent & verbalizes proper cleaning.    Time 4    Period Weeks    Status New    Target Date 06/02/21      PT SHORT TERM GOAL #2   Title Patient tolerates prosthesis >10 hrs total /day without skin issues increasing.    Time 4    Period Weeks    Status New    Target Date 06/02/21      PT SHORT TERM GOAL #3   Title Patient able to reach 7" and look over both shoulders without UE support with supervision    Time 4    Period Weeks    Status New    Target Date 06/02/21      PT SHORT TERM GOAL #4   Title Patient ambulates 200' with RW & prosthesis with supervision verbal cues only for deviation no loss of balance    Time 4    Period Weeks    Status New    Target Date 06/02/21      PT SHORT TERM GOAL #5   Title Patient negotiates ramps & curbs with RW & prosthesis with supervision.    Time 4    Period Weeks    Status New    Target Date 06/02/21               PT Long Term Goals - 05/04/21 1140       PT LONG TERM GOAL #1   Title Patient demonstrates & verbalized understanding of prosthetic care to enable safe utilization of prosthesis.    Time 12    Period Weeks    Status New    Target Date 07/28/21      PT LONG TERM GOAL #2   Title Patient tolerates prosthesis wear >90% of awake hours without skin or limb pain issues.    Time 12  Period Weeks    Status New    Target Date 07/28/21      PT LONG TERM GOAL #3   Title Berg Balance >/= 45/56 to indicate lower fall risk    Time 12    Period Weeks    Status New    Target Date 07/28/21      PT LONG TERM GOAL #4   Title Patient ambulates 300' with LRAD & prosthesis modified independent    Time 12    Period Weeks    Status New    Target Date 07/28/21      PT LONG TERM GOAL #5   Title Patient negotiates ramps, curbs & stairs with LRAD & prosthesis modified independent.    Time 12    Period Weeks     Status New    Target Date 07/28/21                   Plan - 05/18/21 1008     Clinical Impression Statement PT educated on traveling with prosthesis, donning with long pants and driving options with TTA which he appears to understand.  His balance & mobility with prosthesis & RW have improved.    Personal Factors and Comorbidities Comorbidity 3+;Fitness;Time since onset of injury/illness/exacerbation    Comorbidities NSTEMI 9/12/022, HFrEF, ischemic cardiomyopathy EF 20-25%,, CAD, HTN, hyperlipidemia, DM2, PVD s/p right BKA, COVID-19 in June/2022, schizophrenia, prior CVA    Examination-Activity Limitations Lift;Locomotion Level;Reach Overhead;Squat;Stairs;Stand;Transfers    Examination-Participation Restrictions Community Activity    Stability/Clinical Decision Making Evolving/Moderate complexity    Rehab Potential Good    PT Frequency 2x / week    PT Duration 12 weeks    PT Treatment/Interventions ADLs/Self Care Home Management;DME Instruction;Gait training;Stair training;Functional mobility training;Therapeutic activities;Therapeutic exercise;Balance training;Neuromuscular re-education;Patient/family education;Prosthetic Training;Passive range of motion;Vestibular    PT Next Visit Plan monitor HR with activities, review prosthetic care, check wounds if no increase in size or integrity then increase wear time weekly, begin exercises to work on strength, endurance & balance and prosthetic gait with cane.    PT Home Exercise Plan HEP at sink see pt instructions 05/10/2021    Consulted and Agree with Plan of Care Patient             Patient will benefit from skilled therapeutic intervention in order to improve the following deficits and impairments:  Abnormal gait, Decreased activity tolerance, Decreased balance, Decreased coordination, Decreased endurance, Decreased knowledge of use of DME, Decreased mobility, Decreased skin integrity, Decreased strength, Increased edema, Postural  dysfunction, Prosthetic Dependency, Obesity  Visit Diagnosis: Other abnormalities of gait and mobility  Unsteadiness on feet  Muscle weakness (generalized)  Abnormal posture  History of falling     Problem List Patient Active Problem List   Diagnosis Date Noted   Acute on chronic HFrEF (heart failure with reduced ejection fraction) (HCC) 04/11/2021   CAD (coronary artery disease) 04/11/2021   S/P below knee amputation, right (HCC) 02/07/2021   Cutaneous abscess of right foot    Diabetic foot infection (HCC)    SOB (shortness of breath)    Sepsis (HCC) 01/23/2021   Cellulitis 01/23/2021   Elevated troponin 01/23/2021   Acute metabolic encephalopathy 01/23/2021   AKI (acute kidney injury) (HCC) 01/23/2021   COVID-19 virus infection 01/23/2021   Wound infection 10/21/2019   Burn, foot, second degree, unspecified laterality, sequela 10/21/2019   QT prolongation 07/30/2019   Acute exacerbation of CHF (congestive heart failure) (HCC) 07/30/2019   Acute on chronic  combined systolic and diastolic CHF (congestive heart failure) (HCC) 07/29/2019   Foot ulcer (HCC) 03/11/2018   Weakness 05/08/2017   Leukocytosis 05/08/2017   Stroke (HCC) 05/08/2017   Slurred speech 05/08/2017   Uncontrolled type 2 diabetes mellitus with hyperglycemia, with long-term current use of insulin (HCC) 12/07/2015   Noncompliance 01/22/2014   Chronic systolic heart failure (HCC) 10/02/2011   Erectile dysfunction 10/02/2011   Obstructive sleep apnea 10/11/2007   Diabetes mellitus with complication (HCC) 10/10/2007   HLD (hyperlipidemia) 10/10/2007   HYPOKALEMIA 10/10/2007   Obesity, Class III, BMI 40-49.9 (morbid obesity) (HCC) 10/10/2007   Essential hypertension 10/10/2007   PREMATURE VENTRICULAR CONTRACTIONS 10/10/2007   SYNCOPE 10/10/2007   COLONIC POLYPS, HX OF 10/10/2007    Vladimir Faster, PT, DPT 05/18/2021, 10:59 AM  [facility] [address] [place], [place], [address] Phone: [phone]   Fax:  [phone]  Name: Mitchell [name] MRN: [id number] Date of Birth: 13-Oct-1970

## 2021-05-18 NOTE — Patient Instructions (Signed)
Driving options with Below Knee Prosthesis   Option 1:  Remove prosthesis and use left leg to crossover drive Option 2:  2 Foot driving - right foot on gas & left foot on brake.  Prosthetic motion is leg press motion not ankle pump.  Focus is on heel pressing out not on toes/forefoot.   Option 3: Use prosthesis on both gas & brake.  Continue to focus on heel motion & position.  Option 4: Left foot accelerator.  Consider portable left foot accelerator ( http://plfa.org ) as allows to switch between cars or remove for other drivers.  Left foot both gas & brake.  Using cruise control to accelerate & decelerate or resume speed after stopping can safe some leg work / motion.   Practice initially in large empty parking lot. Stay in middle not near curbs. Practice turning & backing into parking spot on right & left. Parallel park.  Back up.  Quick stops. More about learning foot work not staying in motion.   

## 2021-05-23 ENCOUNTER — Other Ambulatory Visit: Payer: Self-pay

## 2021-05-23 ENCOUNTER — Ambulatory Visit (INDEPENDENT_AMBULATORY_CARE_PROVIDER_SITE_OTHER): Payer: No Typology Code available for payment source | Admitting: Physical Therapy

## 2021-05-23 ENCOUNTER — Telehealth: Payer: Self-pay | Admitting: Orthopedic Surgery

## 2021-05-23 ENCOUNTER — Encounter: Payer: Self-pay | Admitting: Physical Therapy

## 2021-05-23 ENCOUNTER — Other Ambulatory Visit: Payer: Self-pay | Admitting: Orthopedic Surgery

## 2021-05-23 DIAGNOSIS — R2681 Unsteadiness on feet: Secondary | ICD-10-CM | POA: Diagnosis not present

## 2021-05-23 DIAGNOSIS — R2689 Other abnormalities of gait and mobility: Secondary | ICD-10-CM | POA: Diagnosis not present

## 2021-05-23 DIAGNOSIS — R293 Abnormal posture: Secondary | ICD-10-CM | POA: Diagnosis not present

## 2021-05-23 DIAGNOSIS — M6281 Muscle weakness (generalized): Secondary | ICD-10-CM | POA: Diagnosis not present

## 2021-05-23 DIAGNOSIS — Z9181 History of falling: Secondary | ICD-10-CM

## 2021-05-23 MED ORDER — OXYCODONE-ACETAMINOPHEN 5-325 MG PO TABS: 1.0000 | ORAL_TABLET | Freq: Three times a day (TID) | ORAL | 0 refills | Status: DC | PRN

## 2021-05-23 NOTE — Therapy (Signed)
[facility] Physical Therapy [address] [place], [place], [address] Phone: [phone]   Fax:  [phone]  Physical Therapy Treatment  Patient Details  Name: Mitchell [name] MRN: [id number] Date of Birth: [birth year] Referring Provider (PT): Aldean Baker, MD   Encounter Date: 05/23/2021   PT End of Session - 05/23/21 1011     Visit Number 5    Number of Visits 25    Date for PT Re-Evaluation 07/28/21    Authorization Type [employer] / [employer] $15 copay no visit limit    PT Start Time 1013    PT Stop Time 1052    PT Time Calculation (min) 39 min    Equipment Utilized During Treatment Gait belt    Activity Tolerance Patient tolerated treatment well;Patient limited by fatigue    Behavior During Therapy [facility] for tasks assessed/performed             Past Medical History:  Diagnosis Date   Bipolar disorder (HCC)    CAD (coronary artery disease)    a. diffuse 3v CAD by cath 2019, medical therapy recommended.   Cataract    forming    Chronic systolic CHF (congestive heart failure) (HCC)    CVA (cerebral infarction)    No residual deficits   Depression    PTSD,    Diabetes mellitus    TYPE II; UNCONTROLLED BY HEMOGLOBIN A1c; STABLE AS  PER DISCHARGE   Headache(784.0)    Herpes simplex of male genitalia    History of colonic polyps    Hyperlipidemia    Hypertension    Myocardial infarction (HCC) 1987   (while playing football)   Obesity    OSA (obstructive sleep apnea)    repeat study 2018 without significant OSA   Pneumonia    Post-cardiac injury syndrome (HCC)    History of cardiac injury from blunt trauma   Pulmonary hypertension (HCC)    a. moderately elevated PASP 07/2019.   PVCs (premature ventricular contractions)    Schizophrenia (HCC)    Goes to [facility]   Sleep apnea    Stroke [facility]) 2005   some left side weakness   Syncope    Recurrent, thought to be vasovagal. Also has h/o frequent PVCs.     Past Surgical History:   Procedure Laterality Date   AMPUTATION Right 01/25/2021   Procedure: RIGHT BELOW KNEE AMPUTATION;  Surgeon: Nadara Mustard, MD;  Location: [facility] OR;  Service: Orthopedics;  Laterality: Right;   CARDIAC CATHETERIZATION  12/19/10   DIFFUSE NONOBSTRUCTIVE CAD; NONISCHEMIC CARDIOMYOPATHY; LEFT VENTRICULAR ANGIOGRAM WAS PERFORMED SECONDARY TO  ELEVATED LEFT VENTRICULAR FILLING PRESSURES   COLONOSCOPY     ~ age [age]   COLONOSCOPY W/ POLYPECTOMY     I & D EXTREMITY Bilateral 10/24/2019   Procedure: IRRIGATION AND DEBRIDEMENT BILATERAL EXTREMITY WOUND ON FOOT;  Surgeon: Allena Napoleon, MD;  Location: [facility] OR;  Service: Plastics;  Laterality: Bilateral;   METATARSAL HEAD EXCISION Right 08/29/2018   Procedure: METATARSAL HEAD RESECTION;  Surgeon: Felecia Shelling, DPM;  Location: [facility] OR;  Service: Podiatry;  Laterality: Right;   METATARSAL OSTEOTOMY Right 08/29/2018   Procedure: SUB FIFTHE METATARSIA RIGHT FOOT;  Surgeon: Felecia Shelling, DPM;  Location: [facility] OR;  Service: Podiatry;  Laterality: Right;   MULTIPLE EXTRACTIONS WITH ALVEOLOPLASTY  01/27/2014   "all my teeth; 4 Quadrants of alveoloplasty   MULTIPLE EXTRACTIONS WITH ALVEOLOPLASTY N/A 01/27/2014   Procedure: EXTRACTION OF TEETH #'1, 2, 3, 4, 5, 6, 7,  8, 9, 10, 11, 12, 13, 14, 15, 16, 17, 20, 21, 22, 23, 24, 25, 26, 27, 28, 29, 31 and 32 WITH ALVEOLOPLASTY;  Surgeon: Charlynne Pander, DDS;  Location: [facility] OR;  Service: Oral Surgery;  Laterality: N/A;   ORIF FINGER / THUMB FRACTURE Right    POLYPECTOMY     RIGHT HEART CATH N/A 01/27/2021   Procedure: RIGHT HEART CATH;  Surgeon: Dolores Patty, MD;  Location: [facility] IN[facility];  Service: Cardiovascular;  Laterality: N/A;   RIGHT/LEFT HEART CATH AND CORONARY ANGIOGRAPHY N/A 09/26/2017   Procedure: RIGHT/LEFT HEART CATH AND CORONARY ANGIOGRAPHY;  Surgeon: Dolores Patty, MD;  [facility]: [facility];  Service: Cardiovascular;  Laterality: N/A;   RIGHT/LEFT HEART CATH AND CORONARY ANGIOGRAPHY N/A  04/12/2021   Procedure: RIGHT/LEFT HEART CATH AND CORONARY ANGIOGRAPHY;  Surgeon: Dolores Patty, MD;  Location: [facility];  Service: Cardiovascular;  Laterality: N/A;   SKIN SPLIT GRAFT Bilateral 10/24/2019   Procedure: SKIN GRAFT SPLIT THICKNESS LEFT THIGH;  Surgeon: Allena Napoleon, MD;  Location: [facility] OR;  Service: Plastics;  Laterality: Bilateral;   WOUND DEBRIDEMENT Right 08/29/2018   Procedure: Debridement of ulcer on right fifth metatarsal;  Surgeon: Felecia Shelling, DPM;  Location: [facility] OR;  Service: Podiatry;  Laterality: Right;    There were no vitals filed for this visit.   Subjective Assessment - 05/23/21 1011     Subjective He went to [place] over weekend.  He did A-Z every 30 min sitting & stretched legs every 2 hrs.  He is wearing 4hrs 3x/day (3rd may be short due to going to bed).  He has been adjusting ply socks as PT recommended.    Pertinent History NSTEMI 9/12/022, HFrEF, ischemic cardiomyopathy EF 20-25%,, CAD, HTN, hyperlipidemia, DM2, PVD s/p right BKA, COVID-19 in June/2022, schizophrenia, prior CVA    Patient Stated Goals He would like to use prosthesis for community                               [facility] Adult PT Treatment/Exercise - 05/23/21 1013       Transfers   Transfers Sit to Stand;Stand to Sit    Sit to Stand 5: Supervision;With upper extremity assist;With armrests;From chair/3-in-1;Other (comment)   to RW or counter   Stand to Sit 5: Supervision;With upper extremity assist;With armrests;To chair/3-in-1;Other (comment)   from RW or counter     Ambulation/Gait   Ambulation/Gait Yes    Ambulation/Gait Assistance 5: Supervision    Ambulation/Gait Assistance Details PT demo & verbal cues on sequence with cane.  Trialed stand alone tip vs std tip with no noted difference for stability. Pt prefers std tip.    Ambulation Distance (Feet) 100 Feet   100' X 2   Assistive device Rolling walker;Prosthesis;Straight cane    Ambulation Surface  Level;Indoor    Stairs Yes    Stairs Assistance 5: Supervision    Stairs Assistance Details (indicate cue type and reason) PT verbal & tactile cues on technique with single rail, cane & TTA prosthesis    Stair Management Technique One rail Right;One rail Left;With cane;Step to pattern;Forwards    Number of Stairs 11   6 w/right rail & 5 w/left rail   Height of Stairs 7    Ramp 4: Min assist   cane & TTA prosthesis   Ramp Details (indicate cue type and reason) PT demo & verbal  cues on technique with cane    Curb 4: Min assist   min guard w/ cane & TTA prosthesis   Curb Details (indicate cue type and reason) PT demo & verbal cues on technique with cane    Gait Comments HR 80-82bpm & SpO2 98-99% (within normal limits) with activities      Self-Care   Self-Care --    ADL's --    Other Self-Care Comments  --      Exercises   Exercises Knee/Hip      Knee/Hip Exercises: Aerobic   Nustep Seat 13 level 3 with BLEs / BUEs for 8 min      Knee/Hip Exercises: Standing   Rocker Board --    Rocker Board Limitations --      Knee/Hip Exercises: Seated   Sit to [employer] --      Prosthetics   Prosthetic Care Comments  He reports walking into bathroom to shower with prosthesis which helps.  Continue short Vivewear shrinker under liner.    Current prosthetic wear tolerance (days/week)  daily since PT eval    Current prosthetic wear tolerance (#hours/day)  4 hrs 3x/day    Current prosthetic weight-bearing tolerance (hours/day)  Patient tolerated standing 5 minutes & gait with RW for PWB on prosthesis with no c/o limb discomfort    Residual limb condition  3 wounds (lateral 2 are 1cm & medial one is 2 cm X 1cm) dry scabs, no hair growth, normal moisture & temperature, intermittent phantom pain, no limb pain, cylinderical shape    Education Provided Residual limb care;Prosthetic cleaning;Proper wear schedule/adjustment;Other (comment);Correct ply sock adjustment;Proper Donning   see prosthetic care comments    Person(s) Educated Patient    Education Method Explanation;Verbal cues    Education Method Verbalized understanding;Verbal cues required;Needs further instruction    Donning Prosthesis Modified independent (device/increased time)                       PT Short Term Goals - 05/04/21 1253       PT SHORT TERM GOAL #1   Title Patient donnes prosthesis modified independent & verbalizes proper cleaning.    Time 4    Period Weeks    Status New    Target Date 06/02/21      PT SHORT TERM GOAL #2   Title Patient tolerates prosthesis >10 hrs total /day without skin issues increasing.    Time 4    Period Weeks    Status New    Target Date 06/02/21      PT SHORT TERM GOAL #3   Title Patient able to reach 7" and look over both shoulders without UE support with supervision    Time 4    Period Weeks    Status New    Target Date 06/02/21      PT SHORT TERM GOAL #4   Title Patient ambulates 200' with RW & prosthesis with supervision verbal cues only for deviation no loss of balance    Time 4    Period Weeks    Status New    Target Date 06/02/21      PT SHORT TERM GOAL #5   Title Patient negotiates ramps & curbs with RW & prosthesis with supervision.    Time 4    Period Weeks    Status New    Target Date 06/02/21               PT Long Term Goals -  05/04/21 1140       PT LONG TERM GOAL #1   Title Patient demonstrates & verbalized understanding of prosthetic care to enable safe utilization of prosthesis.    Time 12    Period Weeks    Status New    Target Date 07/28/21      PT LONG TERM GOAL #2   Title Patient tolerates prosthesis wear >90% of awake hours without skin or limb pain issues.    Time 12    Period Weeks    Status New    Target Date 07/28/21      PT LONG TERM GOAL #3   Title Berg Balance >/= 45/56 to indicate lower fall risk    Time 12    Period Weeks    Status New    Target Date 07/28/21      PT LONG TERM GOAL #4   Title Patient  ambulates 300' with LRAD & prosthesis modified independent    Time 12    Period Weeks    Status New    Target Date 07/28/21      PT LONG TERM GOAL #5   Title Patient negotiates ramps, curbs & stairs with LRAD & prosthesis modified independent.    Time 12    Period Weeks    Status New    Target Date 07/28/21                   Plan - 05/23/21 1015     Clinical Impression Statement PT educated on prosthetic gait with cane including ramps & curbs. He improved with instruction & practice but does not appear safe to use without supervision yet. His wounds continue to heal with use of Vivewear shrinker under liner.    Personal Factors and Comorbidities Comorbidity 3+;Fitness;Time since onset of injury/illness/exacerbation    Comorbidities NSTEMI 9/12/022, HFrEF, ischemic cardiomyopathy EF 20-25%,, CAD, HTN, hyperlipidemia, DM2, PVD s/p right BKA, COVID-19 in June/2022, schizophrenia, prior CVA    Examination-Activity Limitations Lift;Locomotion Level;Reach Overhead;Squat;Stairs;Stand;Transfers    Examination-Participation Restrictions Community Activity    Stability/Clinical Decision Making Evolving/Moderate complexity    Rehab Potential Good    PT Frequency 2x / week    PT Duration 12 weeks    PT Treatment/Interventions ADLs/Self Care Home Management;DME Instruction;Gait training;Stair training;Functional mobility training;Therapeutic activities;Therapeutic exercise;Balance training;Neuromuscular re-education;Patient/family education;Prosthetic Training;Passive range of motion;Vestibular    PT Next Visit Plan monitor HR with activities,  check wounds if no increase in size or integrity then increase wear time weekly, begin exercises to work on strength, endurance & balance and prosthetic gait with cane.    PT Home Exercise Plan HEP at sink see pt instructions 05/10/2021    Consulted and Agree with Plan of Care Patient             Patient will benefit from skilled therapeutic  intervention in order to improve the following deficits and impairments:  Abnormal gait, Decreased activity tolerance, Decreased balance, Decreased coordination, Decreased endurance, Decreased knowledge of use of DME, Decreased mobility, Decreased skin integrity, Decreased strength, Increased edema, Postural dysfunction, Prosthetic Dependency, Obesity  Visit Diagnosis: Other abnormalities of gait and mobility  Unsteadiness on feet  Abnormal posture  Muscle weakness (generalized)  History of falling     Problem List Patient Active Problem List   Diagnosis Date Noted   Acute on chronic HFrEF (heart failure with reduced ejection fraction) (HCC) 04/11/2021   CAD (coronary artery disease) 04/11/2021   S/P below knee amputation, right (HCC) 02/07/2021  Cutaneous abscess of right foot    Diabetic foot infection (HCC)    SOB (shortness of breath)    Sepsis (HCC) 01/23/2021   Cellulitis 01/23/2021   Elevated troponin 01/23/2021   Acute metabolic encephalopathy 01/23/2021   AKI (acute kidney injury) (HCC) 01/23/2021   COVID-19 virus infection 01/23/2021   Wound infection 10/21/2019   Burn, foot, second degree, unspecified laterality, sequela 10/21/2019   QT prolongation 07/30/2019   Acute exacerbation of CHF (congestive heart failure) (HCC) 07/30/2019   Acute on chronic combined systolic and diastolic CHF (congestive heart failure) (HCC) 07/29/2019   Foot ulcer (HCC) 03/11/2018   Weakness 05/08/2017   Leukocytosis 05/08/2017   Stroke (HCC) 05/08/2017   Slurred speech 05/08/2017   Uncontrolled type 2 diabetes mellitus with hyperglycemia, with long-term current use of insulin (HCC) 12/07/2015   Noncompliance 01/22/2014   Chronic systolic heart failure (HCC) 10/02/2011   Erectile dysfunction 10/02/2011   Obstructive sleep apnea 10/11/2007   Diabetes mellitus with complication (HCC) 10/10/2007   HLD (hyperlipidemia) 10/10/2007   HYPOKALEMIA 10/10/2007   Obesity, Class III, BMI  40-49.9 (morbid obesity) (HCC) 10/10/2007   Essential hypertension 10/10/2007   PREMATURE VENTRICULAR CONTRACTIONS 10/10/2007   SYNCOPE 10/10/2007   COLONIC POLYPS, HX OF 10/10/2007    Vladimir Faster, PT, DPT 05/23/2021, 11:00 AM  [facility] Physical Therapy [address] [place], [place], [address] Phone: [phone]   Fax:  [phone]  Name: NUMAN ZYLSTRA MRN: [id number] Date of Birth: 1971/04/04

## 2021-05-23 NOTE — Telephone Encounter (Signed)
Pt came in stating his pain has been bothering him and he would like to get a refill of his pain rx sent to the [facility] on [address]. Pt would like a CB when this has been done please.   [phone]

## 2021-05-23 NOTE — Telephone Encounter (Signed)
Called pt lm on vm to advise that rx has been sent to pharm. To call with any questions.

## 2021-05-23 NOTE — Telephone Encounter (Signed)
Pt is s/p a BKA 01/25/21 has started PT and requesting refill on Oxycodone. Last refill was 04/18/21 #30 please advise.

## 2021-05-25 ENCOUNTER — Encounter: Payer: No Typology Code available for payment source | Admitting: Physical Therapy

## 2021-05-25 ENCOUNTER — Other Ambulatory Visit: Payer: Self-pay

## 2021-05-25 ENCOUNTER — Encounter: Payer: Self-pay | Admitting: Physical Therapy

## 2021-05-25 ENCOUNTER — Ambulatory Visit (INDEPENDENT_AMBULATORY_CARE_PROVIDER_SITE_OTHER): Payer: No Typology Code available for payment source | Admitting: Physical Therapy

## 2021-05-25 VITALS — BP 118/83 | HR 83 | Temp 100.0°F

## 2021-05-25 DIAGNOSIS — R2681 Unsteadiness on feet: Secondary | ICD-10-CM | POA: Diagnosis not present

## 2021-05-25 DIAGNOSIS — R293 Abnormal posture: Secondary | ICD-10-CM

## 2021-05-25 DIAGNOSIS — R2689 Other abnormalities of gait and mobility: Secondary | ICD-10-CM

## 2021-05-25 DIAGNOSIS — M6281 Muscle weakness (generalized): Secondary | ICD-10-CM | POA: Diagnosis not present

## 2021-05-25 NOTE — Therapy (Signed)
[facility] Physical Therapy [address] [place], [place], [address] Phone: [phone]   Fax:  [phone]  Physical Therapy Treatment  Patient Details  Name: Mitchell [name] MRN: [id number] Date of Birth: [birth year] Referring Provider (PT): Aldean Baker, MD   Encounter Date: 05/25/2021   PT End of Session - 05/25/21 1136     Visit Number 6    Number of Visits 25    Date for PT Re-Evaluation 07/28/21    Authorization Type [employer] / [employer] $15 copay no visit limit    PT Start Time 1136    PT Stop Time 1228    PT Time Calculation (min) 52 min    Equipment Utilized During Treatment Gait belt    Activity Tolerance Patient tolerated treatment well;Patient limited by fatigue    Behavior During Therapy [facility] for tasks assessed/performed             Past Medical History:  Diagnosis Date   Bipolar disorder (HCC)    CAD (coronary artery disease)    a. diffuse 3v CAD by cath 2019, medical therapy recommended.   Cataract    forming    Chronic systolic CHF (congestive heart failure) (HCC)    CVA (cerebral infarction)    No residual deficits   Depression    PTSD,    Diabetes mellitus    TYPE II; UNCONTROLLED BY HEMOGLOBIN A1c; STABLE AS  PER DISCHARGE   Headache(784.0)    Herpes simplex of male genitalia    History of colonic polyps    Hyperlipidemia    Hypertension    Myocardial infarction (HCC) 1987   (while playing football)   Obesity    OSA (obstructive sleep apnea)    repeat study 2018 without significant OSA   Pneumonia    Post-cardiac injury syndrome (HCC)    History of cardiac injury from blunt trauma   Pulmonary hypertension (HCC)    a. moderately elevated PASP 07/2019.   PVCs (premature ventricular contractions)    Schizophrenia (HCC)    Goes to [facility]   Sleep apnea    Stroke [facility]) 2005   some left side weakness   Syncope    Recurrent, thought to be vasovagal. Also has h/o frequent PVCs.     Past Surgical History:   Procedure Laterality Date   AMPUTATION Right 01/25/2021   Procedure: RIGHT BELOW KNEE AMPUTATION;  Surgeon: Nadara Mustard, MD;  Location: [facility] OR;  Service: Orthopedics;  Laterality: Right;   CARDIAC CATHETERIZATION  12/19/10   DIFFUSE NONOBSTRUCTIVE CAD; NONISCHEMIC CARDIOMYOPATHY; LEFT VENTRICULAR ANGIOGRAM WAS PERFORMED SECONDARY TO  ELEVATED LEFT VENTRICULAR FILLING PRESSURES   COLONOSCOPY     ~ age [age]   COLONOSCOPY W/ POLYPECTOMY     I & D EXTREMITY Bilateral 10/24/2019   Procedure: IRRIGATION AND DEBRIDEMENT BILATERAL EXTREMITY WOUND ON FOOT;  Surgeon: Allena Napoleon, MD;  Location: [facility] OR;  Service: Plastics;  Laterality: Bilateral;   METATARSAL HEAD EXCISION Right 08/29/2018   Procedure: METATARSAL HEAD RESECTION;  Surgeon: Felecia Shelling, DPM;  Location: [facility] OR;  Service: Podiatry;  Laterality: Right;   METATARSAL OSTEOTOMY Right 08/29/2018   Procedure: SUB FIFTHE METATARSIA RIGHT FOOT;  Surgeon: Felecia Shelling, DPM;  Location: [facility] OR;  Service: Podiatry;  Laterality: Right;   MULTIPLE EXTRACTIONS WITH ALVEOLOPLASTY  01/27/2014   "all my teeth; 4 Quadrants of alveoloplasty   MULTIPLE EXTRACTIONS WITH ALVEOLOPLASTY N/A 01/27/2014   Procedure: EXTRACTION OF TEETH #'1, 2, 3, 4, 5, 6, 7,  8, 9, 10, 11, 12, 13, 14, 15, 16, 17, 20, 21, 22, 23, 24, 25, 26, 27, 28, 29, 31 and 32 WITH ALVEOLOPLASTY;  Surgeon: Charlynne Pander, DDS;  Location: [facility] OR;  Service: Oral Surgery;  Laterality: N/A;   ORIF FINGER / THUMB FRACTURE Right    POLYPECTOMY     RIGHT HEART CATH N/A 01/27/2021   Procedure: RIGHT HEART CATH;  Surgeon: Dolores Patty, MD;  Location: [facility] INVASIVE CV LAB;  Service: Cardiovascular;  Laterality: N/A;   RIGHT/LEFT HEART CATH AND CORONARY ANGIOGRAPHY N/A 09/26/2017   Procedure: RIGHT/LEFT HEART CATH AND CORONARY ANGIOGRAPHY;  Surgeon: Dolores Patty, MD;  Location: [facility] INVAS[facility] CV LAB;  Service: Cardiovascular;  Laterality: N/A;   RIGHT/LEFT HEART CATH AND CORONARY ANGIOGRAPHY N/A  04/12/2021   Procedure: RIGHT/LEFT HEART CATH AND CORONARY ANGIOGRAPHY;  Surgeon: Dolores Patty, MD;  Location: [facility] INVAS[facility] CV LAB;  Service: Cardiovascular;  Laterality: N/A;   SKIN SPLIT GRAFT Bilateral 10/24/2019   Procedure: SKIN GRAFT SPLIT THICKNESS LEFT THIGH;  Surgeon: Allena Napoleon, MD;  Location: [facility] OR;  Service: Plastics;  Laterality: Bilateral;   WOUND DEBRIDEMENT Right 08/29/2018   Procedure: Debridement of ulcer on right fifth metatarsal;  Surgeon: Felecia Shelling, DPM;  Location: [facility] OR;  Service: Podiatry;  Laterality: Right;    Vitals:   05/25/21 1155  BP: 118/83  Pulse: 83  Temp: 100 F (37.8 C)  Blood Glucose 128   Subjective Assessment - 05/25/21 1136     Subjective His prosthesis felt too tight with 3-ply socks (too many with current limb volume)  He is wearing prosthesis 4hrs 3x/day (3rd wear may be shorter due to bed time).    Pertinent History NSTEMI 9/12/022, HFrEF, ischemic cardiomyopathy EF 20-25%,, CAD, HTN, hyperlipidemia, DM2, PVD s/p right BKA, COVID-19 in June/2022, schizophrenia, prior CVA    Patient Stated Goals He would like to use prosthesis for community    Currently in Pain? No/denies                               [facility] Adult PT Treatment/Exercise - 05/25/21 1136       Transfers   Transfers Sit to Stand;Stand to Sit    Sit to Stand 5: Supervision;With upper extremity assist;With armrests;From chair/3-in-1;Other (comment)   to RW or counter   Stand to Sit 5: Supervision;With upper extremity assist;With armrests;To chair/3-in-1;Other (comment)   from RW or counter     Ambulation/Gait   Ambulation/Gait Yes    Ambulation/Gait Assistance 5: Supervision    Ambulation Distance (Feet) 100 Feet   100' X 2   Assistive device Rolling walker;Prosthesis;Straight cane   arrives/exits with RW, session with cane   Ambulation Surface Level;Indoor    Stairs --    Stairs Assistance --    Stair Management Technique --    Number of  Stairs --    Height of Stairs --    Ramp 4: Min assist   min guard, cane & TTA prosthesis   Ramp Details (indicate cue type and reason) verbal cues on technique    Curb 4: Min assist   min guard w/ cane & TTA prosthesis   Curb Details (indicate cue type and reason) verbal cues on technique    Gait Comments HR 80-83bpm & SpO2 98-99% (within normal limits) with activities.      Exercises   Exercises Knee/Hip      Knee/Hip  Exercises: Aerobic   Nustep Seat 13 level 5 with BLEs / BUEs for 8 min      Prosthetics   Prosthetic Care Comments  PT reviewed adjusting ply socks and doffing. Increase wear to 5hrs on, 2hrs off, 5hrs on, 2 hrs off, then wear until bedtime.  Switch out short shrinker under liner ~middle of 5 hours.   Continue short Vivewear shrinker under liner.    Current prosthetic wear tolerance (days/week)  daily since PT eval    Current prosthetic wear tolerance (#hours/day)  4 hrs 3x/day    Current prosthetic weight-bearing tolerance (hours/day)  Patient tolerated standing 5 minutes & gait with RW for PWB on prosthesis with no c/o limb discomfort    Residual limb condition  3 wounds (lateral 2 are 1cm & medial one is 2 cm X 1cm) dry scabs, no hair growth, normal moisture & temperature, intermittent phantom pain, no limb pain, cylinderical shape    Education Provided Residual limb care;Proper wear schedule/adjustment;Other (comment);Correct ply sock adjustment;Proper Doffing   see prosthetic care comments   Person(s) Educated Patient    Education Method Explanation;Demonstration;Tactile cues;Verbal cues    Education Method Verbalized understanding;Returned demonstration;Tactile cues required;Verbal cues required;Needs further instruction    Donning Prosthesis Modified independent (device/increased time)    Doffing Prosthesis Supervision                       PT Short Term Goals - 05/04/21 1253       PT SHORT TERM GOAL #1   Title Patient donnes prosthesis modified  independent & verbalizes proper cleaning.    Time 4    Period Weeks    Status New    Target Date 06/02/21      PT SHORT TERM GOAL #2   Title Patient tolerates prosthesis >10 hrs total /day without skin issues increasing.    Time 4    Period Weeks    Status New    Target Date 06/02/21      PT SHORT TERM GOAL #3   Title Patient able to reach 7" and look over both shoulders without UE support with supervision    Time 4    Period Weeks    Status New    Target Date 06/02/21      PT SHORT TERM GOAL #4   Title Patient ambulates 200' with RW & prosthesis with supervision verbal cues only for deviation no loss of balance    Time 4    Period Weeks    Status New    Target Date 06/02/21      PT SHORT TERM GOAL #5   Title Patient negotiates ramps & curbs with RW & prosthesis with supervision.    Time 4    Period Weeks    Status New    Target Date 06/02/21               PT Long Term Goals - 05/04/21 1140       PT LONG TERM GOAL #1   Title Patient demonstrates & verbalized understanding of prosthetic care to enable safe utilization of prosthesis.    Time 12    Period Weeks    Status New    Target Date 07/28/21      PT LONG TERM GOAL #2   Title Patient tolerates prosthesis wear >90% of awake hours without skin or limb pain issues.    Time 12    Period Weeks    Status New  Target Date 07/28/21      PT LONG TERM GOAL #3   Title Berg Balance >/= 45/56 to indicate lower fall risk    Time 12    Period Weeks    Status New    Target Date 07/28/21      PT LONG TERM GOAL #4   Title Patient ambulates 300' with LRAD & prosthesis modified independent    Time 12    Period Weeks    Status New    Target Date 07/28/21      PT LONG TERM GOAL #5   Title Patient negotiates ramps, curbs & stairs with LRAD & prosthesis modified independent.    Time 12    Period Weeks    Status New    Target Date 07/28/21                   Plan - 05/25/21 1136     Clinical  Impression Statement Patient was symptomatic of blood glucose dropping and reported taking medication but not eating.  Blood Glucose was 128.  Pt ate & drank small snack which decreased symptoms.  PT worked on prosthetic gait with cane including ramp & curb which he improved from last session.    Personal Factors and Comorbidities Comorbidity 3+;Fitness;Time since onset of injury/illness/exacerbation    Comorbidities NSTEMI 9/12/022, HFrEF, ischemic cardiomyopathy EF 20-25%,, CAD, HTN, hyperlipidemia, DM2, PVD s/p right BKA, COVID-19 in June/2022, schizophrenia, prior CVA    Examination-Activity Limitations Lift;Locomotion Level;Reach Overhead;Squat;Stairs;Stand;Transfers    Examination-Participation Restrictions Community Activity    Stability/Clinical Decision Making Evolving/Moderate complexity    Rehab Potential Good    PT Frequency 2x / week    PT Duration 12 weeks    PT Treatment/Interventions ADLs/Self Care Home Management;DME Instruction;Gait training;Stair training;Functional mobility training;Therapeutic activities;Therapeutic exercise;Balance training;Neuromuscular re-education;Patient/family education;Prosthetic Training;Passive range of motion;Vestibular    PT Next Visit Plan check STGs, monitor HR with activities,  check wounds if no increase in size or integrity then increase wear time weekly, begin exercises to work on strength, endurance & balance and prosthetic gait with cane.    PT Home Exercise Plan HEP at sink see pt instructions 05/10/2021    Consulted and Agree with Plan of Care Patient             Patient will benefit from skilled therapeutic intervention in order to improve the following deficits and impairments:  Abnormal gait, Decreased activity tolerance, Decreased balance, Decreased coordination, Decreased endurance, Decreased knowledge of use of DME, Decreased mobility, Decreased skin integrity, Decreased strength, Increased edema, Postural dysfunction, Prosthetic  Dependency, Obesity  Visit Diagnosis: Other abnormalities of gait and mobility  Unsteadiness on feet  Abnormal posture  Muscle weakness (generalized)     Problem List Patient Active Problem List   Diagnosis Date Noted   Acute on chronic HFrEF (heart failure with reduced ejection fraction) (HCC) 04/11/2021   CAD (coronary artery disease) 04/11/2021   S/P below knee amputation, right (HCC) 02/07/2021   Cutaneous abscess of right foot    Diabetic foot infection (HCC)    SOB (shortness of breath)    Sepsis (HCC) 01/23/2021   Cellulitis 01/23/2021   Elevated troponin 01/23/2021   Acute metabolic encephalopathy 01/23/2021   AKI (acute kidney injury) (HCC) 01/23/2021   COVID-19 virus infection 01/23/2021   Wound infection 10/21/2019   Burn, foot, second degree, unspecified laterality, sequela 10/21/2019   QT prolongation 07/30/2019   Acute exacerbation of CHF (congestive heart failure) (HCC) 07/30/2019   Acute  on chronic combined systolic and diastolic CHF (congestive heart failure) (HCC) 07/29/2019   Foot ulcer (HCC) 03/11/2018   Weakness 05/08/2017   Leukocytosis 05/08/2017   Stroke (HCC) 05/08/2017   Slurred speech 05/08/2017   Uncontrolled type 2 diabetes mellitus with hyperglycemia, with long-term current use of insulin (HCC) 12/07/2015   Noncompliance 01/22/2014   Chronic systolic heart failure (HCC) 10/02/2011   Erectile dysfunction 10/02/2011   Obstructive sleep apnea 10/11/2007   Diabetes mellitus with complication (HCC) 10/10/2007   HLD (hyperlipidemia) 10/10/2007   HYPOKALEMIA 10/10/2007   Obesity, Class III, BMI 40-49.9 (morbid obesity) (HCC) 10/10/2007   Essential hypertension 10/10/2007   PREMATURE VENTRICULAR CONTRACTIONS 10/10/2007   SYNCOPE 10/10/2007   COLONIC POLYPS, HX OF 10/10/2007    Vladimir Faster, PT, DPT 05/25/2021, 12:36 PM  [facility] Physical Therapy [address] [place], [place], [address] Phone: [phone]   Fax:   [phone]  Name: Mitchell [name] MRN: [id number] Date of Birth: Jul 09, 1971

## 2021-05-30 ENCOUNTER — Ambulatory Visit (INDEPENDENT_AMBULATORY_CARE_PROVIDER_SITE_OTHER): Payer: No Typology Code available for payment source | Admitting: Physical Therapy

## 2021-05-30 ENCOUNTER — Other Ambulatory Visit: Payer: Self-pay

## 2021-05-30 ENCOUNTER — Encounter: Payer: Self-pay | Admitting: Physical Therapy

## 2021-05-30 DIAGNOSIS — R2689 Other abnormalities of gait and mobility: Secondary | ICD-10-CM

## 2021-05-30 DIAGNOSIS — R2681 Unsteadiness on feet: Secondary | ICD-10-CM

## 2021-05-30 DIAGNOSIS — M6281 Muscle weakness (generalized): Secondary | ICD-10-CM | POA: Diagnosis not present

## 2021-05-30 DIAGNOSIS — R293 Abnormal posture: Secondary | ICD-10-CM | POA: Diagnosis not present
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
## 2021-05-31 ENCOUNTER — Telehealth (HOSPITAL_COMMUNITY): Payer: Self-pay

## 2021-05-31 NOTE — Telephone Encounter (Signed)
Called to confirm/remind patient of their appointment [facility] on [facility].   Patient reminded to bring all medications and/or complete list.  Confirmed patient has transportation. Gave directions, instructed to utilize valet parking.  Confirmed appointment prior to ending call.

## 2021-05-31 NOTE — Progress Notes (Addendum)
ADVANCED HF CLINIC NOTE  PCP: [name], MD HF Cardiologist: Dr. [name]  HPI: Mitchell [name] is a 50 y.o.male with multiple medical problems HFrEF, mixed ischemic/nonischemic CM, 3v CAD, obesity, hypertension, type II DM, obstructive sleep apnea, bipolar disease/schizophrenia, prior CVA.    Had [facility] on 09/26/17 with 3v disease and preserved CO/CI. No revascularization options. Saw EP about ICD but did not f/u.   Had been lost to follow-up in HF clinic since 2020.  Patient admitted June 2022 with sepsis secondary to RLE abscess/cellulitis and COVID-19. He ended up requiring R BKA.  Readmitted 04/11/21 with a/c HFrEF and NSTEMI.  He was diuresed with IV lasix and started on GDMT. L/RHC with severe 3 vessel CAD (no targets for intervention), RA 4, PA 49/17 (31), PCW 15, Fick CO/index 6.7/2.7. He was referred to paramedicine program at discharge. Visual impairment felt to be contributor to issues with medication compliance.  Today he returns for HF follow up. Overall feeling fine. He now has a prosthetic and is walking with a walker. No SOB with walking on flat surfaces or with steps going into his apartment. Denies CP, dizziness, edema, or PND/Orthopnea. Appetite ok. No fever or chills.  Taking all medications. Has not needed to take any torsemide recently. BPs at home 120-130/80s. Followed by paramedicine.   CARDIAC TESTING: R/LHC (9/22):   Prox LAD to Mid LAD lesion is 80% stenosed.   Dist LAD lesion is 99% stenosed.   Dist RCA lesion is 90% stenosed.   Ost 1st Mrg lesion is 95% stenosed.   1st Mrg lesion is 99% stenosed.   Ost 2nd Mrg to 2nd Mrg lesion is 99% stenosed.   Ost RPDA to RPDA lesion is 90% stenosed.   RPAV lesion is 95% stenosed.   Mid LM lesion is 20% stenosed.  Findings: Ao = 131/84 (107) LV = 131/22 RA = 4 RV = 49/11 PA = 49/17 (31) PCW = 15 Fick cardiac output/index = 6.7/2.7 PVR = 2.4 FA sat = 95% PA sat = 56%, 63%  Assessment: Severe 3v  diffuse CAD with moderate progression from previous Ischemic CM EF 25% Mild pulmonary venous HTN with preserved cardiac output   - Echo (6/22): EF 20-25%, mild LVH, trivial MR  ROS: All systems negative except as listed in HPI, PMH and Problem List.  SH:  Social History   Socioeconomic History   Marital status: Married    Spouse name: Not on file   Number of children: 5   Years of education: Not on file   Highest education level: Not on file  Occupational History   Occupation: Mortician  Tobacco Use   Smoking status: Never   Smokeless tobacco: Never  Vaping Use   Vaping Use: Never used  Substance and Sexual Activity   Alcohol use: No   Drug use: No   Sexual activity: Not on file  Other Topics Concern   Not on file  Social History Narrative   MARRIED, [place]; 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   Social Determinants of Health   Financial Resource Strain: Medium Risk   Difficulty of Paying Living Expenses: Somewhat hard  Food Insecurity: No Food Insecurity   Worried About Charity fundraiser in the Last Year: Never true   Ran Out of Food in the Last Year: Never true  Transportation Needs: No Transportation Needs  Lack of Transportation (Medical): No   Lack of Transportation (Non-Medical): No  Physical Activity: Not on file  Stress: Not on file  Social Connections: Not on file  Intimate Partner Violence: Not on file    FH:  Family History  Problem Relation Age of Onset   Heart disease Mother        MI   Heart failure Mother    Diabetes Mother        ALSO IN MOST OF HIS SIBLINGS; 2 UNLCES HAVE ALSO PASSED AWAY FROM DM   Cardiomyopathy Mother    Cancer - Ovarian Mother    Ovarian cancer Mother    Heart disease Father    Hypertension Father    Diabetes Father    Diabetes Sister    Diabetes Brother    Colon cancer Paternal Uncle    Colon cancer Paternal Uncle     Colon polyps Neg Hx    Esophageal cancer Neg Hx    Rectal cancer Neg Hx    Stomach cancer Neg Hx     Past Medical History:  Diagnosis Date   Bipolar disorder ([place])    CAD (coronary artery disease)    a. diffuse 3v CAD by cath 2019, medical therapy recommended.   Cataract    forming    Chronic systolic CHF (congestive heart failure) (HCC)    CVA (cerebral infarction)    No residual deficits   Depression    PTSD,    Diabetes mellitus    TYPE II; UNCONTROLLED BY HEMOGLOBIN A1c; STABLE AS  PER DISCHARGE   Headache(784.0)    Herpes simplex of male genitalia    History of colonic polyps    Hyperlipidemia    Hypertension    Myocardial infarction ([place]) 1987   (while playing football)   Obesity    OSA (obstructive sleep apnea)    repeat study 2018 without significant OSA   Pneumonia    Post-cardiac injury syndrome ([place])    History of cardiac injury from blunt trauma   Pulmonary hypertension ([place])    a. moderately elevated PASP 07/2019.   PVCs (premature ventricular contractions)    Schizophrenia ([place])    Goes to [facility]   Sleep apnea    Stroke [facility]) 2005   some left side weakness   Syncope    Recurrent, thought to be vasovagal. Also has h/o frequent PVCs.     Current Outpatient Medications  Medication Sig Dispense Refill   amitriptyline (ELAVIL) 10 MG tablet Take 1 tablet (10 mg total) by mouth at bedtime. 30 tablet 1   ascorbic acid (VITAMIN C) 250 MG tablet Take 1 tablet (250 mg total) by mouth daily. 30 tablet 0   aspirin 81 MG EC tablet Take 1 tablet (81 mg total) by mouth daily. 30 tablet 12   Blood Glucose Monitoring Suppl (ACCU-CHEK GUIDE ME) w/Device KIT 1 each by Does not apply route 2 (two) times daily. Use to monitor glucose levels BID 1 kit 0   carvedilol (COREG) 12.5 MG tablet Take 1 tablet (12.5 mg total) by mouth 2 (two) times daily with a meal. 60 tablet 11   cholecalciferol (VITAMIN D3) 25 MCG (1000 UNIT) tablet Take 1,000 Units by  mouth daily.     clopidogrel (PLAVIX) 75 MG tablet Take 1 tablet (75 mg total) by mouth daily. 30 tablet 3   dapagliflozin propanediol (FARXIGA) 10 MG TABS tablet Take 1 tablet (10 mg total) by mouth daily before breakfast. 30 tablet 11  gabapentin (NEURONTIN) 100 MG capsule Take 1 capsule (100 mg total) by mouth at bedtime. 30 capsule 0   glucose blood (ACCU-CHEK GUIDE) test strip 1 each by Other route in the morning and at bedtime. And lancets 2/day. 200 each 3   hydrocerin (EUCERIN) CREA Apply 1 application topically 2 (two) times daily. To left foot/leg 454 g 0   Insulin Glargine (BASAGLAR KWIKPEN) 100 UNIT/ML Inject 21 Units into the skin 2 (two) times daily.     Lancets 28G MISC Use for glucose testing up to BID 100 each 12   Lancets Misc. (ACCU-CHEK FASTCLIX LANCET) KIT Use to monitor glucose levels BID 200 kit 1   methocarbamol (ROBAXIN) 500 MG tablet Take 1 tablet (500 mg total) by mouth every 6 (six) hours as needed for muscle spasms. 60 tablet 0   oxyCODONE-acetaminophen (PERCOCET/ROXICET) 5-325 MG tablet Take 1 tablet by mouth every 8 (eight) hours as needed for severe pain. 30 tablet 0   pantoprazole (PROTONIX) 40 MG tablet Take 40 mg by mouth as needed.     potassium chloride SA (KLOR-CON) 20 MEQ tablet Take 1 tablet with Torsemide. (Patient taking differently: as needed. Take 1 tablet with Torsemide.) 15 tablet 3   rosuvastatin (CRESTOR) 40 MG tablet Take 1 tablet (40 mg total) by mouth daily. 30 tablet 3   sacubitril-valsartan (ENTRESTO) 97-103 MG Take 1 tablet by mouth 2 (two) times daily. 60 tablet 11   spironolactone (ALDACTONE) 25 MG tablet Take 1 tablet (25 mg total) by mouth daily. 30 tablet 3   torsemide (DEMADEX) 20 MG tablet Take 1 tablet (20 mg total) by mouth as needed. 15 tablet 3   No current facility-administered medications for this encounter.   Wt Readings from Last 3 Encounters:  06/01/21 125.1 kg (275 lb 12.8 oz)  05/12/21 125.5 kg (276 lb 9.6 oz)  04/15/21  128.3 kg (282 lb 13.6 oz)   BP 140/88   Pulse 81   Wt 125.1 kg (275 lb 12.8 oz)   SpO2 100%   BMI 37.93 kg/m   PHYSICAL EXAM: General:  NAD. No resp difficulty HEENT: Normal Neck: Supple. No JVD. Carotids 2+ bilat; no bruits. No lymphadenopathy or thryomegaly appreciated. Cor: PMI nondisplaced. Regular rate & rhythm. No rubs, gallops or murmurs. Lungs: Clear Abdomen: Obese, nontender, nondistended. No hepatosplenomegaly. No bruits or masses. Good bowel sounds. Extremities: No cyanosis, clubbing, rash, edema; s/p R BKA Neuro: Alert & oriented x 3, cranial nerves grossly intact. Moves all 4 extremities w/o difficulty. Affect pleasant.  ASSESSMENT & PLAN:  Chronic systolic HF /Mixed ischemic/nonischemic cardiomyopathy  - Echo (6/22): EF 20 to 25%, stable from prior exams dating back to 8  - R/LHC (9/22): Severe 3v CAD (no targets for revascularization), mild pulm venous HTN with preserved CO. - Recent admit 9/22 with a/c CHF. Compliance an issue. Now enrolled in paramedicine, appreciate their assistance. - Improved NYHA II, functional status difficult with limited mobility after BKA. Volume status stable on exam.   - Increase carvedilol to 18.75 mg bid. - Continue Farxiga 10 mg daily.  - Continue spiro 25 mg daily. - Continue [place] 97/103 mg bid. - Continue torsemide 20 mg prn. - Repeat echo. - BMET today.   2. CAD/NSTEMI 09/22 - Cath with progressive 3v CAD as above. No targets for intervention.  - Denies any angina. - Continue DAPT with aspirin + plavix, statin and beta blocker.    3. Hypertension - BP above goal but has not had meds yet today.  -  Increase carvedilol as above.   4. Hyperlipidemia  - Continue statin. - Re-check lipids next visit.   5. Type 2 diabetes - Last A1c 8.1 on 01/23/2021 - Continue Farxiga. - On insulin, management per PCP.   6. PVD s/p R BKA  - Diabetic foot wound complicated by sepsis that led to amputation on 01/25/2021 - Continue  aspirin and statin.   Follow up with Dr. [name] + echo in 3 months.  Allena Katz, FNP-BC 06/01/21

## 2021-06-01 ENCOUNTER — Other Ambulatory Visit: Payer: Self-pay

## 2021-06-01 ENCOUNTER — Ambulatory Visit (HOSPITAL_COMMUNITY)
Admission: RE | Admit: 2021-06-01 | Discharge: 2021-06-01 | Disposition: A | Discharge status: Home / Self Care | Payer: No Typology Code available for payment source | Source: Ambulatory Visit | Attending: Internal Medicine | Admitting: Internal Medicine

## 2021-06-01 ENCOUNTER — Other Ambulatory Visit (HOSPITAL_COMMUNITY): Payer: Self-pay

## 2021-06-01 ENCOUNTER — Encounter (HOSPITAL_COMMUNITY): Payer: Self-pay

## 2021-06-01 VITALS — BP 140/88 | HR 81 | Wt 275.8 lb

## 2021-06-01 DIAGNOSIS — Z7982 Long term (current) use of aspirin: Secondary | ICD-10-CM | POA: Diagnosis not present

## 2021-06-01 DIAGNOSIS — Z7901 Long term (current) use of anticoagulants: Secondary | ICD-10-CM | POA: Diagnosis not present

## 2021-06-01 DIAGNOSIS — Z8249 Family history of ischemic heart disease and other diseases of the circulatory system: Secondary | ICD-10-CM | POA: Insufficient documentation

## 2021-06-01 DIAGNOSIS — E782 Mixed hyperlipidemia: Secondary | ICD-10-CM

## 2021-06-01 DIAGNOSIS — I252 Old myocardial infarction: Secondary | ICD-10-CM | POA: Insufficient documentation

## 2021-06-01 DIAGNOSIS — I5022 Chronic systolic (congestive) heart failure: Secondary | ICD-10-CM | POA: Diagnosis present

## 2021-06-01 DIAGNOSIS — I2510 Atherosclerotic heart disease of native coronary artery without angina pectoris: Secondary | ICD-10-CM | POA: Insufficient documentation

## 2021-06-01 DIAGNOSIS — I739 Peripheral vascular disease, unspecified: Secondary | ICD-10-CM

## 2021-06-01 DIAGNOSIS — I255 Ischemic cardiomyopathy: Secondary | ICD-10-CM | POA: Diagnosis not present

## 2021-06-01 DIAGNOSIS — Z794 Long term (current) use of insulin: Secondary | ICD-10-CM | POA: Diagnosis not present

## 2021-06-01 DIAGNOSIS — E1169 Type 2 diabetes mellitus with other specified complication: Secondary | ICD-10-CM

## 2021-06-01 DIAGNOSIS — I110 Hypertensive heart disease with heart failure: Secondary | ICD-10-CM | POA: Insufficient documentation

## 2021-06-01 DIAGNOSIS — Z8616 Personal history of COVID-19: Secondary | ICD-10-CM | POA: Insufficient documentation

## 2021-06-01 DIAGNOSIS — E785 Hyperlipidemia, unspecified: Secondary | ICD-10-CM | POA: Diagnosis not present

## 2021-06-01 DIAGNOSIS — Z8673 Personal history of transient ischemic attack (TIA), and cerebral infarction without residual deficits: Secondary | ICD-10-CM | POA: Diagnosis not present

## 2021-06-01 DIAGNOSIS — Z7902 Long term (current) use of antithrombotics/antiplatelets: Secondary | ICD-10-CM | POA: Diagnosis not present

## 2021-06-01 DIAGNOSIS — Z79899 Other long term (current) drug therapy: Secondary | ICD-10-CM | POA: Insufficient documentation

## 2021-06-01 DIAGNOSIS — I10 Essential (primary) hypertension: Secondary | ICD-10-CM | POA: Diagnosis not present

## 2021-06-01 DIAGNOSIS — E118 Type 2 diabetes mellitus with unspecified complications: Secondary | ICD-10-CM | POA: Diagnosis not present

## 2021-06-01 LAB — BASIC METABOLIC PANEL
Anion gap: 6 (ref 5–15)
BUN: 21 mg/dL — ABNORMAL HIGH (ref 6–20)
CO2: 22 mmol/L (ref 22–32)
Calcium: 8.9 mg/dL (ref 8.9–10.3)
Chloride: 113 mmol/L — ABNORMAL HIGH (ref 98–111)
Creatinine, Ser: 1.29 mg/dL — ABNORMAL HIGH (ref 0.61–1.24)
GFR, Estimated: >60 mL/min (ref >60)
Glucose, Bld: 104 mg/dL — ABNORMAL HIGH (ref 70–99)
Potassium: 4.3 mmol/L (ref 3.5–5.1)
Sodium: 141 mmol/L (ref 135–145)

## 2021-06-01 MED ORDER — CARVEDILOL 12.5 MG PO TABS: 18.7500 mg | ORAL_TABLET | Freq: Two times a day (BID) | ORAL | 5 refills | Status: DC

## 2021-06-01 NOTE — Progress Notes (Signed)
Paramedicine Encounter   Patient ID: Mitchell [name] , male,   DOB: 12-28-1970,50 y.o.,  MRN: [id number]   Met patient in clinic today with provider.   Weight @ clinic-275 W/prosthetic   B/p-140/88 P-81 Sp02-100  He is doing well. He is managing his meds on his own.  He has not taken meds yet this morning-he just woke up not long before coming to clinic and only has taken his farxiga and not the rest of his meds due to him not eating anything yet.  Fluid level looks good today.   Labs done today.  His carvedilol is being increased to 18.71m BID.  Will f/u in 2 weeks for b/p check.  He has 4 appointments back to back next week-, 2 PT and 1 with PCP and 1 with endo doc so he will get his b/p checked a couple times next week.  Repeat ECHO will be done soon in around 2-3 months.  Last ECHO was 20-25%.   KMarylouise Stacks [facility]11/08/2020

## 2021-06-01 NOTE — Patient Instructions (Addendum)
Labs were done today, if any labs are abnormal the clinic will call you  INCREASE Carvedilol to 18.75 mg 1 1/2 tablets twice daily   Your physician recommends that you schedule a follow-up appointment in: in 3 months with echocardiogram   At the [facility], you and your health needs are our priority. As part of our continuing mission to provide you with exceptional heart care, we have created designated Provider Care Teams. These Care Teams include your primary Cardiologist (physician) and Advanced Practice Providers (APPs- Physician Assistants and Nurse Practitioners) who all work together to provide you with the care you need, when you need it.   You may see any of the following providers on your designated Care Team at your next follow up: Dr Arvilla Meres Dr Carron Curie, NP Robbie Lis, [place] [facility] [place], [place] Karle Plumber, PharmD   Please be sure to bring in all your medications bottles to every appointment.   If you have any questions or concerns before your next appointment please send [place] a message through [place] or call our office at [phone].    TO LEAVE A MESSAGE FOR THE NURSE SELECT OPTION 2, PLEASE LEAVE A MESSAGE INCLUDING: YOUR NAME DATE OF BIRTH CALL BACK NUMBER REASON FOR CALL**this is important as we prioritize the call backs  YOU WILL RECEIVE A CALL BACK THE SAME DAY AS LONG AS YOU CALL BEFORE 4:00 PM

## 2021-06-02 ENCOUNTER — Encounter: Payer: Self-pay | Admitting: Rehabilitation

## 2021-06-02 ENCOUNTER — Ambulatory Visit (INDEPENDENT_AMBULATORY_CARE_PROVIDER_SITE_OTHER): Payer: No Typology Code available for payment source | Admitting: Rehabilitation

## 2021-06-02 DIAGNOSIS — R2681 Unsteadiness on feet: Secondary | ICD-10-CM

## 2021-06-02 DIAGNOSIS — R293 Abnormal posture: Secondary | ICD-10-CM

## 2021-06-02 DIAGNOSIS — R2689 Other abnormalities of gait and mobility: Secondary | ICD-10-CM | POA: Diagnosis not present

## 2021-06-02 DIAGNOSIS — M6281 Muscle weakness (generalized): Secondary | ICD-10-CM
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
## 2021-06-04 NOTE — Patient Instructions (Addendum)
Good to see you again today Please get the latest covid booster if not done already We will get you a Cologuard kit sent to your home   Please come and see me in January- sooner if you need anything

## 2021-06-06 ENCOUNTER — Encounter: Payer: Self-pay | Admitting: Family Medicine

## 2021-06-06 ENCOUNTER — Ambulatory Visit (INDEPENDENT_AMBULATORY_CARE_PROVIDER_SITE_OTHER): Payer: No Typology Code available for payment source | Admitting: Family Medicine

## 2021-06-06 ENCOUNTER — Other Ambulatory Visit: Payer: Self-pay

## 2021-06-06 VITALS — BP 126/77 | HR 73 | Temp 98.5°F | Resp 16 | Ht 71.5 in | Wt 263.0 lb

## 2021-06-06 DIAGNOSIS — I5022 Chronic systolic (congestive) heart failure: Secondary | ICD-10-CM

## 2021-06-06 DIAGNOSIS — Z1211 Encounter for screening for malignant neoplasm of colon: Secondary | ICD-10-CM

## 2021-06-06 DIAGNOSIS — E119 Type 2 diabetes mellitus without complications: Secondary | ICD-10-CM | POA: Diagnosis not present

## 2021-06-06 DIAGNOSIS — Z89511 Acquired absence of right leg below knee: Secondary | ICD-10-CM

## 2021-06-06 DIAGNOSIS — E782 Mixed hyperlipidemia: Secondary | ICD-10-CM | POA: Diagnosis not present

## 2021-06-06 DIAGNOSIS — E11319 Type 2 diabetes mellitus with unspecified diabetic retinopathy without macular edema: Secondary | ICD-10-CM | POA: Insufficient documentation

## 2021-06-06 DIAGNOSIS — Z794 Long term (current) use of insulin: Secondary | ICD-10-CM

## 2021-06-06 DIAGNOSIS — I10 Essential (primary) hypertension: Secondary | ICD-10-CM

## 2021-06-07 ENCOUNTER — Other Ambulatory Visit: Payer: Self-pay

## 2021-06-07 ENCOUNTER — Encounter: Payer: Self-pay | Admitting: Physical Therapy

## 2021-06-07 ENCOUNTER — Ambulatory Visit (INDEPENDENT_AMBULATORY_CARE_PROVIDER_SITE_OTHER): Payer: No Typology Code available for payment source | Admitting: Physical Therapy

## 2021-06-07 DIAGNOSIS — R2681 Unsteadiness on feet: Secondary | ICD-10-CM

## 2021-06-07 DIAGNOSIS — R2689 Other abnormalities of gait and mobility: Secondary | ICD-10-CM | POA: Diagnosis not present

## 2021-06-07 DIAGNOSIS — M6281 Muscle weakness (generalized): Secondary | ICD-10-CM | POA: Diagnosis not present

## 2021-06-07 DIAGNOSIS — R293 Abnormal posture: Secondary | ICD-10-CM | POA: Diagnosis not present
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
## 2021-06-08 ENCOUNTER — Ambulatory Visit (INDEPENDENT_AMBULATORY_CARE_PROVIDER_SITE_OTHER): Payer: No Typology Code available for payment source | Admitting: Endocrinology

## 2021-06-08 ENCOUNTER — Telehealth (HOSPITAL_COMMUNITY): Payer: Self-pay | Admitting: Licensed Clinical Social Worker

## 2021-06-08 VITALS — BP 100/60 | HR 75 | Ht 71.5 in | Wt 278.0 lb

## 2021-06-08 DIAGNOSIS — E118 Type 2 diabetes mellitus with unspecified complications: Secondary | ICD-10-CM | POA: Diagnosis not present

## 2021-06-08 LAB — POCT GLYCOSYLATED HEMOGLOBIN (HGB A1C): Hemoglobin A1C: 7.9 % — AB (ref 4.0–5.6)

## 2021-06-08 MED ORDER — TRULICITY 0.75 MG/0.5ML ~~LOC~~ SOAJ: 0.7500 mg | SUBCUTANEOUS | 3 refills | Status: DC

## 2021-06-08 MED ORDER — BASAGLAR KWIKPEN 100 UNIT/ML ~~LOC~~ SOPN: 20.0000 [IU] | PEN_INJECTOR | SUBCUTANEOUS | 3 refills | Status: DC

## 2021-06-08 NOTE — Progress Notes (Signed)
Subjective:    Patient ID: Mitchell [name], male    DOB: [birth year], 50 y.o.   MRN: [id number]  HPI Pt returns for f/u of diabetes mellitus: DM type: Insulin-requiring type 2 Dx'ed: 7408 Complications: PN, foot ulcer, CRI, DR, right BKA, and CVA.   Therapy: insulin since 2017, and Farxiga.   DKA: never Severe hypoglycemia: never Pancreatitis: never Pancreatic imaging: normal on 2012 CT.  Other: he declines multiple daily injections; CHF is a relative contraindication to metformin; he works 2nd shift, [employer].   Interval history: he says he has not recently missed the insulin.  He says cbg's vary from 118-170.     Past Medical History:  Diagnosis Date   Bipolar disorder ([place])    CAD (coronary artery disease)    a. diffuse 3v CAD by cath 2019, medical therapy recommended.   Cataract    forming    Chronic systolic CHF (congestive heart failure) (HCC)    CVA (cerebral infarction)    No residual deficits   Depression    PTSD,    Diabetes mellitus    TYPE II; UNCONTROLLED BY HEMOGLOBIN A1c; STABLE AS  PER DISCHARGE   Headache(784.0)    Herpes simplex of male genitalia    History of colonic polyps    Hyperlipidemia    Hypertension    Myocardial infarction ([place]) 1987   (while playing football)   Obesity    OSA (obstructive sleep apnea)    repeat study 2018 without significant OSA   Pneumonia    Post-cardiac injury syndrome ([place])    History of cardiac injury from blunt trauma   Pulmonary hypertension ([place])    a. moderately elevated PASP 07/2019.   PVCs (premature ventricular contractions)    Schizophrenia ([place])    Goes to [facility]   Sleep apnea    Stroke [facility]) 2005   some left side weakness   Syncope    Recurrent, thought to be vasovagal. Also has h/o frequent PVCs.     Past Surgical History:  Procedure Laterality Date   AMPUTATION Right 01/25/2021   Procedure: RIGHT BELOW KNEE AMPUTATION;  Surgeon: [name], MD;  Location: [place];   Service: Orthopedics;  Laterality: Right;   CARDIAC CATHETERIZATION  12/19/10   DIFFUSE NONOBSTRUCTIVE CAD; NONISCHEMIC CARDIOMYOPATHY; LEFT VENTRICULAR ANGIOGRAM WAS PERFORMED SECONDARY TO  ELEVATED LEFT VENTRICULAR FILLING PRESSURES   COLONOSCOPY     ~ age 42-23   COLONOSCOPY W/ POLYPECTOMY     I & D EXTREMITY Bilateral 10/24/2019   Procedure: IRRIGATION AND DEBRIDEMENT BILATERAL EXTREMITY WOUND ON FOOT;  Surgeon: Cindra Presume, MD;  Location: [place];  Service: Plastics;  Laterality: Bilateral;   METATARSAL HEAD EXCISION Right 08/29/2018   Procedure: METATARSAL HEAD RESECTION;  Surgeon: Edrick Kins, DPM;  Location: [place];  Service: Podiatry;  Laterality: Right;   METATARSAL OSTEOTOMY Right 08/29/2018   Procedure: SUB FIFTHE METATARSIA RIGHT FOOT;  Surgeon: Edrick Kins, DPM;  Location: [place];  Service: Podiatry;  Laterality: Right;   MULTIPLE EXTRACTIONS WITH ALVEOLOPLASTY  01/27/2014   "all my teeth; 4 Quadrants of alveoloplasty   MULTIPLE EXTRACTIONS WITH ALVEOLOPLASTY N/A 01/27/2014   Procedure: EXTRACTION OF TEETH #'1, 2, 3, 4, 5, 6, 7, 8, 9, 10, 11, 12, 13, 14, 15, 16, 17, 20, 21, 22, 23, 24, 25, 26, 27, 28, 29, 31 and 32 WITH ALVEOLOPLASTY;  Surgeon: Lenn Cal, DDS;  Location: [place];  Service: Oral Surgery;  Laterality: N/A;  ORIF FINGER / THUMB FRACTURE Right    POLYPECTOMY     RIGHT HEART CATH N/A 01/27/2021   Procedure: RIGHT HEART CATH;  Surgeon: Jolaine Artist, MD;  Location: [place] [facility];  Service: Cardiovascular;  Laterality: N/A;   RIGHT/LEFT HEART CATH AND CORONARY ANGIOGRAPHY N/A 09/26/2017   Procedure: RIGHT/LEFT HEART CATH AND CORONARY ANGIOGRAPHY;  Surgeon: Jolaine Artist, MD;  Location: [place] [facility];  Service: Cardiovascular;  Laterality: N/A;   RIGHT/LEFT HEART CATH AND CORONARY ANGIOGRAPHY N/A 04/12/2021   Procedure: RIGHT/LEFT HEART CATH AND CORONARY ANGIOGRAPHY;  Surgeon: Jolaine Artist, MD;  Location: [place] [facility];  Service:  Cardiovascular;  Laterality: N/A;   SKIN SPLIT GRAFT Bilateral 10/24/2019   Procedure: SKIN GRAFT SPLIT THICKNESS LEFT THIGH;  Surgeon: Cindra Presume, MD;  Location: [place];  Service: Plastics;  Laterality: Bilateral;   WOUND DEBRIDEMENT Right 08/29/2018   Procedure: Debridement of ulcer on right fifth metatarsal;  Surgeon: Edrick Kins, DPM;  Location: [place];  Service: Podiatry;  Laterality: Right;    Social History   Socioeconomic History   Marital status: Married    Spouse name: Not on file   Number of children: 5   Years of education: Not on file   Highest education level: Not on file  Occupational History   Occupation: Mortician  Tobacco Use   Smoking status: Never   Smokeless tobacco: Never  Vaping Use   Vaping Use: Never used  Substance and Sexual Activity   Alcohol use: No   Drug use: No   Sexual activity: Not on file  Other Topics Concern   Not on file  Social History Narrative   MARRIED, [place]; 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   Social Determinants of Health   Financial Resource Strain: Medium Risk   Difficulty of Paying Living Expenses: Somewhat hard  Food Insecurity: No Food Insecurity   Worried About Charity fundraiser in the Last Year: Never true   Ran Out of Food in the Last Year: Never true  Transportation Needs: No Transportation Needs   Lack of Transportation (Medical): No   Lack of Transportation (Non-Medical): No  Physical Activity: Not on file  Stress: Not on file  Social Connections: Not on file  Intimate Partner Violence: Not on file    Current Outpatient Medications on File Prior to Visit  Medication Sig Dispense Refill   amitriptyline (ELAVIL) 10 MG tablet Take 1 tablet (10 mg total) by mouth at bedtime. 30 tablet 1   ascorbic acid (VITAMIN C) 250 MG tablet Take 1 tablet (250 mg total) by mouth daily. 30 tablet 0   aspirin 81 MG EC tablet  Take 1 tablet (81 mg total) by mouth daily. 30 tablet 12   Blood Glucose Monitoring Suppl (ACCU-CHEK GUIDE ME) w/Device KIT 1 each by Does not apply route 2 (two) times daily. Use to monitor glucose levels BID 1 kit 0   carvedilol (COREG) 12.5 MG tablet Take 1.5 tablets (18.75 mg total) by mouth 2 (two) times daily with a meal. 90 tablet 5   cholecalciferol (VITAMIN D3) 25 MCG (1000 UNIT) tablet Take 1,000 Units by mouth daily.     clopidogrel (PLAVIX) 75 MG tablet Take 1 tablet (75 mg total) by mouth daily. 30 tablet 3   dapagliflozin propanediol (FARXIGA) 10 MG TABS tablet Take 1  tablet (10 mg total) by mouth daily before breakfast. 30 tablet 11   gabapentin (NEURONTIN) 100 MG capsule Take 1 capsule (100 mg total) by mouth at bedtime. 30 capsule 0   glucose blood (ACCU-CHEK GUIDE) test strip 1 each by Other route in the morning and at bedtime. And lancets 2/day. 200 each 3   hydrocerin (EUCERIN) CREA Apply 1 application topically 2 (two) times daily. To left foot/leg 454 g 0   Lancets 28G MISC Use for glucose testing up to BID 100 each 12   Lancets Misc. (ACCU-CHEK FASTCLIX LANCET) KIT Use to monitor glucose levels BID 200 kit 1   methocarbamol (ROBAXIN) 500 MG tablet Take 1 tablet (500 mg total) by mouth every 6 (six) hours as needed for muscle spasms. 60 tablet 0   oxyCODONE-acetaminophen (PERCOCET/ROXICET) 5-325 MG tablet Take 1 tablet by mouth every 8 (eight) hours as needed for severe pain. 30 tablet 0   pantoprazole (PROTONIX) 40 MG tablet Take 40 mg by mouth as needed.     potassium chloride SA (KLOR-CON) 20 MEQ tablet Take 1 tablet with Torsemide. (Patient taking differently: as needed. Take 1 tablet with Torsemide.) 15 tablet 3   rosuvastatin (CRESTOR) 40 MG tablet Take 1 tablet (40 mg total) by mouth daily. 30 tablet 3   sacubitril-valsartan (ENTRESTO) 97-103 MG Take 1 tablet by mouth 2 (two) times daily. 60 tablet 11   spironolactone (ALDACTONE) 25 MG tablet Take 1 tablet (25 mg total)  by mouth daily. 30 tablet 3   torsemide (DEMADEX) 20 MG tablet Take 1 tablet (20 mg total) by mouth as needed. 15 tablet 3   No current facility-administered medications on file prior to visit.    Allergies  Allergen Reactions   Nsaids Anaphylaxis and Other (See Comments)    Able to tolerate aspirin     Family History  Problem Relation Age of Onset   Heart disease Mother        MI   Heart failure Mother    Diabetes Mother        ALSO IN MOST OF HIS SIBLINGS; 2 UNLCES HAVE ALSO PASSED AWAY FROM DM   Cardiomyopathy Mother    Cancer - Ovarian Mother    Ovarian cancer Mother    Heart disease Father    Hypertension Father    Diabetes Father    Diabetes Sister    Diabetes Brother    Colon cancer Paternal Uncle    Colon cancer Paternal Uncle    Colon polyps Neg Hx    Esophageal cancer Neg Hx    Rectal cancer Neg Hx    Stomach cancer Neg Hx     BP 100/60 (BP Location: Right Arm, Patient Position: Sitting, Cuff Size: Large)   Pulse 75   Ht 5' 11.5" (1.816 m)   Wt 278 lb (126.1 kg)   SpO2 98%   BMI 38.23 kg/m    Review of Systems He denies hypoglycemia.      Objective:   Physical Exam    Lab Results  Component Value Date   HGBA1C 7.9 (A) 06/08/2021      Assessment & Plan:  Insulin-requiring type 2 DM: uncontrolled.    Patient Instructions  I have sent a prescription to your pharmacy, to add Trulicity, and:   Please continue the same [name] for now, and:   Reduce the Basaglar to 20 units every morning.   check your blood sugar twice a day.  vary the time of day when you check,  between before the 3 meals, and at bedtime.  also check if you have symptoms of your blood sugar being too high or too low.  please keep a record of the readings and bring it to your next appointment here (or you can bring the meter itself).  You can write it on any piece of paper.  please call us sooner if your blood sugar goes below 70, or if you have a lot of readings over 200.  Please  come back for a follow-up appointment in 2 months.

## 2021-06-08 NOTE — Telephone Encounter (Signed)
HF Paramedicine Team Based Care Meeting  HF MD- NA  HF NP - Amy Clegg NP-C   [facility] HF Paramedicine  Katie Vicente Males  [facility] admit within the last 30 days for heart failure? no  Medications concerns? No  Transportation issues ? No  Education needs? No  SDOH -No  Eligible for discharge? Fairly new so wanting to see him through a few new medication changes and frequent appts but pt has good understanding of medications and condition.  Thinking we can likely DC within a week or so.  [name], LCSW Clinical Social Worker [facility] Desk#: [phone] Cell#: [phone]

## 2021-06-08 NOTE — Patient Instructions (Addendum)
I have sent a prescription to your pharmacy, to add Trulicity, and:   Please continue the same Marcelline Deist for now, and:   Reduce the Basaglar to 20 units every morning.   check your blood sugar twice a day.  vary the time of day when you check, between before the 3 meals, and at bedtime.  also check if you have symptoms of your blood sugar being too high or too low.  please keep a record of the readings and bring it to your next appointment here (or you can bring the meter itself).  You can write it on any piece of paper.  please call us sooner if your blood sugar goes below 70, or if you have a lot of readings over 200.  Please come back for a follow-up appointment in 2 months.

## 2021-06-09 ENCOUNTER — Encounter: Payer: No Typology Code available for payment source | Admitting: Physical Therapy

## 2021-06-13 ENCOUNTER — Ambulatory Visit (INDEPENDENT_AMBULATORY_CARE_PROVIDER_SITE_OTHER): Payer: No Typology Code available for payment source | Admitting: Physical Therapy

## 2021-06-13 ENCOUNTER — Ambulatory Visit (INDEPENDENT_AMBULATORY_CARE_PROVIDER_SITE_OTHER): Payer: No Typology Code available for payment source | Admitting: Orthopedic Surgery

## 2021-06-13 ENCOUNTER — Encounter: Payer: Self-pay | Admitting: Physical Therapy

## 2021-06-13 ENCOUNTER — Other Ambulatory Visit: Payer: Self-pay

## 2021-06-13 DIAGNOSIS — M6281 Muscle weakness (generalized): Secondary | ICD-10-CM

## 2021-06-13 DIAGNOSIS — R2681 Unsteadiness on feet: Secondary | ICD-10-CM

## 2021-06-13 DIAGNOSIS — R2689 Other abnormalities of gait and mobility: Secondary | ICD-10-CM

## 2021-06-13 DIAGNOSIS — Z89511 Acquired absence of right leg below knee: Secondary | ICD-10-CM | POA: Diagnosis not present

## 2021-06-13 DIAGNOSIS — R293 Abnormal posture: Secondary | ICD-10-CM | POA: Diagnosis not present

## 2021-06-13 DIAGNOSIS — Z9181 History of falling: Secondary | ICD-10-CM
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
## 2021-06-14 ENCOUNTER — Ambulatory Visit: Payer: No Typology Code available for payment source | Admitting: Dietician

## 2021-06-14 ENCOUNTER — Telehealth (HOSPITAL_COMMUNITY): Payer: Self-pay

## 2021-06-15 ENCOUNTER — Ambulatory Visit (INDEPENDENT_AMBULATORY_CARE_PROVIDER_SITE_OTHER): Payer: No Typology Code available for payment source | Admitting: Physical Therapy

## 2021-06-15 ENCOUNTER — Other Ambulatory Visit: Payer: Self-pay

## 2021-06-15 ENCOUNTER — Telehealth (HOSPITAL_COMMUNITY): Payer: Self-pay | Admitting: Licensed Clinical Social Worker

## 2021-06-15 ENCOUNTER — Encounter: Payer: Self-pay | Admitting: Physical Therapy

## 2021-06-15 DIAGNOSIS — R2681 Unsteadiness on feet: Secondary | ICD-10-CM

## 2021-06-15 DIAGNOSIS — R293 Abnormal posture: Secondary | ICD-10-CM

## 2021-06-15 DIAGNOSIS — Z9181 History of falling: Secondary | ICD-10-CM

## 2021-06-15 DIAGNOSIS — M6281 Muscle weakness (generalized): Secondary | ICD-10-CM | POA: Diagnosis not present

## 2021-06-15 DIAGNOSIS — R2689 Other abnormalities of gait and mobility: Secondary | ICD-10-CM

## 2021-06-15 NOTE — Telephone Encounter (Signed)
Contacted pt regarding sch for home visit this week for b/p check since med changed.  He reports he was still in bed, sleepy and the day/night before his stomach was giving him trouble so he hadnt rested much.  I advised him to call me once he woke up and we can sch a visit.    Kerry Hough, EMT-Paramedic 06/15/21
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
## 2021-06-15 NOTE — Progress Notes (Signed)
Heart and Vascular Care Navigation  06/15/2021  CURREN MOHRMANN Sep 09, 1970 [id number]  Reason for Referral: Community Paramedic requested CSW reach out to pt regarding current mental health concerns.   Engaged with patient by telephone for follow up visit for Heart and Vascular Care Coordination.                                                                                                   Assessment:   CSW called pt to discuss concerns with depression at this time.   Pt is dealing with multiple medical concerns which are contributing to his depression but main one is his poor eyesight that he is having trouble getting in to see someone in a timely manner.  Doesn't have appt till Jan and feeling helpless without his eyesight which has been very hard on him.  Pt also stating he is having a lot of person issues with family but did not go into detail.  Interested in speaking with someone ASAP.  Was with [facility] before but hasn't seen in some time.  CSW inquired if pt would be agreeable to referral to [facility] to discuss one on one counseling options- pt agreeable and just wants to see someone ASAP.                                  HRT/VAS Care Coordination     Outpatient Care Team Community Paramedicine; Social Worker   Community Paramedic Name: Kerry Hough [phone]   Social Worker Name: [facility]   Living arrangements for the past 2 months Apartment   Lives with: Adult Children; Spouse   Patient Current Optometrist   Patient Has Concern With Paying Medical Bills No   Does Patient Have Prescription Coverage? Yes   Home Assistive Devices/Equipment None   DME Agency NA   HH Agency [facility]   Current home services DME  rolling walker, wheelchair       Social History:                                                                             SDOH Screenings    Alcohol Screen: Not on file  Depression (PHQ2-9): Low Risk    PHQ-2 Score: 4  Financial Resource Strain: Medium Risk   Difficulty of Paying Living Expenses: Somewhat hard  Food Insecurity: No Food Insecurity   Worried About Programme researcher, broadcasting/film/video in the Last Year: Never true   Ran Out of Food in the Last Year: Never true  Housing: Low Risk    Last Housing Risk Score: 0  Physical Activity: Not on file  Social Connections: Not on file  Stress: Not on file  Tobacco Use: Low Risk    Smoking Tobacco Use: Never   Smokeless Tobacco Use: Never   Passive Exposure: Not on file  Transportation Needs: No Transportation Needs   Lack of Transportation (Medical): No   Lack of Transportation (Non-Medical): No   Follow-up plan:    CSW sent referral to [facility]- they will call pt to complete intake- pt aware.  Informed pt of [facility] and encouraged to go if he had urgent mental health needs in the meantime.  Will continue to follow and assist as needed  [name], LCSW Clinical Social Worker [facility] Desk#: [phone] Cell#: [phone]

## 2021-06-16 ENCOUNTER — Ambulatory Visit (INDEPENDENT_AMBULATORY_CARE_PROVIDER_SITE_OTHER): Payer: No Typology Code available for payment source | Admitting: Psychologist

## 2021-06-16 DIAGNOSIS — F321 Major depressive disorder, single episode, moderate: Secondary | ICD-10-CM | POA: Diagnosis not present

## 2021-06-20 ENCOUNTER — Ambulatory Visit (INDEPENDENT_AMBULATORY_CARE_PROVIDER_SITE_OTHER): Payer: No Typology Code available for payment source | Admitting: Physical Therapy

## 2021-06-20 ENCOUNTER — Encounter: Payer: Self-pay | Admitting: Physical Therapy

## 2021-06-20 ENCOUNTER — Other Ambulatory Visit: Payer: Self-pay

## 2021-06-20 DIAGNOSIS — M6281 Muscle weakness (generalized): Secondary | ICD-10-CM | POA: Diagnosis not present

## 2021-06-20 DIAGNOSIS — R293 Abnormal posture: Secondary | ICD-10-CM | POA: Diagnosis not present

## 2021-06-20 DIAGNOSIS — R2689 Other abnormalities of gait and mobility: Secondary | ICD-10-CM

## 2021-06-20 DIAGNOSIS — R2681 Unsteadiness on feet: Secondary | ICD-10-CM
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
## 2021-06-21 ENCOUNTER — Other Ambulatory Visit (HOSPITAL_COMMUNITY): Payer: Self-pay

## 2021-06-21 ENCOUNTER — Ambulatory Visit (INDEPENDENT_AMBULATORY_CARE_PROVIDER_SITE_OTHER): Payer: No Typology Code available for payment source | Admitting: Psychologist

## 2021-06-21 ENCOUNTER — Other Ambulatory Visit: Payer: Self-pay | Admitting: Physical Medicine and Rehabilitation

## 2021-06-21 ENCOUNTER — Ambulatory Visit: Payer: No Typology Code available for payment source | Admitting: Psychologist

## 2021-06-21 DIAGNOSIS — F321 Major depressive disorder, single episode, moderate: Secondary | ICD-10-CM

## 2021-06-21 LAB — COLOGUARD: COLOGUARD: NEGATIVE

## 2021-06-21 NOTE — Telephone Encounter (Signed)
Refill request Amitriptyline. Last note states discussed amitripytyline 10mg  HS. Is this okay to refill?
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
## 2021-06-22 ENCOUNTER — Encounter: Payer: Self-pay | Admitting: Family Medicine

## 2021-06-22 ENCOUNTER — Ambulatory Visit (INDEPENDENT_AMBULATORY_CARE_PROVIDER_SITE_OTHER): Payer: No Typology Code available for payment source | Admitting: Physical Therapy

## 2021-06-22 ENCOUNTER — Other Ambulatory Visit: Payer: Self-pay

## 2021-06-22 ENCOUNTER — Encounter: Payer: Self-pay | Admitting: Physical Therapy

## 2021-06-22 DIAGNOSIS — R2681 Unsteadiness on feet: Secondary | ICD-10-CM | POA: Diagnosis not present

## 2021-06-22 DIAGNOSIS — R2689 Other abnormalities of gait and mobility: Secondary | ICD-10-CM | POA: Diagnosis not present

## 2021-06-22 DIAGNOSIS — M6281 Muscle weakness (generalized): Secondary | ICD-10-CM | POA: Diagnosis not present

## 2021-06-22 DIAGNOSIS — R293 Abnormal posture: Secondary | ICD-10-CM | POA: Diagnosis not present
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
## 2021-06-27 ENCOUNTER — Other Ambulatory Visit: Payer: Self-pay

## 2021-06-27 ENCOUNTER — Ambulatory Visit (INDEPENDENT_AMBULATORY_CARE_PROVIDER_SITE_OTHER): Payer: No Typology Code available for payment source | Admitting: Physical Therapy

## 2021-06-27 ENCOUNTER — Encounter: Payer: Self-pay | Admitting: Physical Therapy

## 2021-06-27 DIAGNOSIS — R2689 Other abnormalities of gait and mobility: Secondary | ICD-10-CM | POA: Diagnosis not present

## 2021-06-27 DIAGNOSIS — R2681 Unsteadiness on feet: Secondary | ICD-10-CM

## 2021-06-27 DIAGNOSIS — M6281 Muscle weakness (generalized): Secondary | ICD-10-CM | POA: Diagnosis not present

## 2021-06-27 DIAGNOSIS — R293 Abnormal posture: Secondary | ICD-10-CM | POA: Diagnosis not present
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
## 2021-06-29 ENCOUNTER — Ambulatory Visit (INDEPENDENT_AMBULATORY_CARE_PROVIDER_SITE_OTHER): Payer: No Typology Code available for payment source | Admitting: Physical Therapy

## 2021-06-29 ENCOUNTER — Encounter: Payer: Self-pay | Admitting: Physical Therapy

## 2021-06-29 ENCOUNTER — Other Ambulatory Visit: Payer: Self-pay | Admitting: Orthopedic Surgery

## 2021-06-29 ENCOUNTER — Telehealth: Payer: Self-pay | Admitting: Physical Therapy

## 2021-06-29 ENCOUNTER — Other Ambulatory Visit: Payer: Self-pay

## 2021-06-29 DIAGNOSIS — R2681 Unsteadiness on feet: Secondary | ICD-10-CM | POA: Diagnosis not present

## 2021-06-29 DIAGNOSIS — R2689 Other abnormalities of gait and mobility: Secondary | ICD-10-CM | POA: Diagnosis not present

## 2021-06-29 DIAGNOSIS — R293 Abnormal posture: Secondary | ICD-10-CM | POA: Diagnosis not present

## 2021-06-29 DIAGNOSIS — M6281 Muscle weakness (generalized): Secondary | ICD-10-CM | POA: Diagnosis not present

## 2021-06-29 MED ORDER — OXYCODONE-ACETAMINOPHEN 5-325 MG PO TABS: 1.0000 | ORAL_TABLET | ORAL | 0 refills | Status: DC | PRN

## 2021-06-29 NOTE — Telephone Encounter (Signed)
Patient reports that he ran out of his pain medication. He had pain last night interrupting his sleep. He thinks it is oxy. Can you please call in a refill? If he needs to take prescription because controlled substance, he is in PT until 12:30 Thanks  Zella Ball
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
## 2021-06-29 NOTE — Telephone Encounter (Signed)
Called pt to advise that rx has been sent to pharm.  

## 2021-06-29 NOTE — Telephone Encounter (Signed)
Please see below and advise.

## 2021-06-30 ENCOUNTER — Telehealth (HOSPITAL_COMMUNITY): Payer: Self-pay | Admitting: Licensed Clinical Social Worker

## 2021-06-30 ENCOUNTER — Other Ambulatory Visit (HOSPITAL_COMMUNITY): Payer: Self-pay
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
## 2021-06-30 NOTE — Telephone Encounter (Signed)
Community paramedic informed CSW that pt having questions about Medicaid.  CSW spoke with pt who states that [facility] requesting all of his medical bills by Monday but he can't get them that quickly so he is not sure what to do.  CSW and pt called [employer] and they are faxing statement to CSW so I can fax to [facility].  Pt also not sure about the state of his case at all.  Despite being told he has to turn in medical bills by Monday he also states that he thinks the [facility] case worker stopped his case.  CSW encouraged him to gather all of the correspondence he has gotten from [facility] and give to the paramedic so I can review and hopefully clarify whats going on.  Will continue to follow and assist as needed  [name], LCSW Clinical Social Worker [facility] Desk#: [phone] Cell#: [phone]

## 2021-07-03 ENCOUNTER — Emergency Department (HOSPITAL_BASED_OUTPATIENT_CLINIC_OR_DEPARTMENT_OTHER)
Admission: EM | Admit: 2021-07-03 | Discharge: 2021-07-03 | Disposition: A | Discharge status: Home / Self Care | Payer: No Typology Code available for payment source | Attending: Emergency Medicine | Admitting: Emergency Medicine

## 2021-07-03 DIAGNOSIS — Z7902 Long term (current) use of antithrombotics/antiplatelets: Secondary | ICD-10-CM | POA: Diagnosis not present

## 2021-07-03 DIAGNOSIS — E119 Type 2 diabetes mellitus without complications: Secondary | ICD-10-CM | POA: Diagnosis not present

## 2021-07-03 DIAGNOSIS — S91205A Unspecified open wound of left lesser toe(s) with damage to nail, initial encounter: Secondary | ICD-10-CM | POA: Insufficient documentation

## 2021-07-03 DIAGNOSIS — Z79899 Other long term (current) drug therapy: Secondary | ICD-10-CM | POA: Insufficient documentation

## 2021-07-03 DIAGNOSIS — I110 Hypertensive heart disease with heart failure: Secondary | ICD-10-CM | POA: Diagnosis not present

## 2021-07-03 DIAGNOSIS — I5022 Chronic systolic (congestive) heart failure: Secondary | ICD-10-CM | POA: Insufficient documentation

## 2021-07-03 DIAGNOSIS — B351 Tinea unguium: Secondary | ICD-10-CM | POA: Insufficient documentation

## 2021-07-03 DIAGNOSIS — Z794 Long term (current) use of insulin: Secondary | ICD-10-CM | POA: Insufficient documentation

## 2021-07-03 DIAGNOSIS — W268XXA Contact with other sharp object(s), not elsewhere classified, initial encounter: Secondary | ICD-10-CM | POA: Insufficient documentation

## 2021-07-03 DIAGNOSIS — I2510 Atherosclerotic heart disease of native coronary artery without angina pectoris: Secondary | ICD-10-CM | POA: Diagnosis not present

## 2021-07-03 DIAGNOSIS — S91209A Unspecified open wound of unspecified toe(s) with damage to nail, initial encounter: Secondary | ICD-10-CM

## 2021-07-03 DIAGNOSIS — Z7982 Long term (current) use of aspirin: Secondary | ICD-10-CM | POA: Insufficient documentation

## 2021-07-03 NOTE — ED Provider Notes (Signed)
[facility] Provider Note: Mitchell Dell, MD, FACEP  CSN: [id number] MRN: [id number] ARRIVAL: 07/03/21 at 0017 ROOM: DB015/DB015   CHIEF COMPLAINT  Toe Injury   HISTORY OF PRESENT ILLNESS  07/03/21 12:28 AM Mitchell [name] is a 50 y.o. male who was trimming his left great toenail just prior to arrival and accidentally partially avulsed his left second toenail.  This toenail is thickened chronically.  He was unable to get the bleeding to stop and so called EMS.  He denies any pain associated with this injury.  He also has elongated toenails of the left third and fourth toes which do not appear injured.   Past Medical History:  Diagnosis Date   Bipolar disorder (HCC)    CAD (coronary artery disease)    a. diffuse 3v CAD by cath 2019, medical therapy recommended.   Cataract    forming    Chronic systolic CHF (congestive heart failure) (HCC)    CVA (cerebral infarction)    No residual deficits   Depression    PTSD,    Diabetes mellitus    TYPE II; UNCONTROLLED BY HEMOGLOBIN A1c; STABLE AS  PER DISCHARGE   Headache(784.0)    Herpes simplex of male genitalia    History of colonic polyps    Hyperlipidemia    Hypertension    Myocardial infarction (HCC) 1987   (while playing football)   Obesity    OSA (obstructive sleep apnea)    repeat study 2018 without significant OSA   Pneumonia    Post-cardiac injury syndrome (HCC)    History of cardiac injury from blunt trauma   Pulmonary hypertension (HCC)    a. moderately elevated PASP 07/2019.   PVCs (premature ventricular contractions)    Schizophrenia (HCC)    Goes to [facility]   Sleep apnea    Stroke [facility]) 2005   some left side weakness   Syncope    Recurrent, thought to be vasovagal. Also has h/o frequent PVCs.     Past Surgical History:  Procedure Laterality Date   AMPUTATION Right 01/25/2021   Procedure: RIGHT BELOW KNEE AMPUTATION;  Surgeon: Nadara Mustard, MD;  Location: [facility] OR;  Service:  Orthopedics;  Laterality: Right;   CARDIAC CATHETERIZATION  12/19/10   DIFFUSE NONOBSTRUCTIVE CAD; NONISCHEMIC CARDIOMYOPATHY; LEFT VENTRICULAR ANGIOGRAM WAS PERFORMED SECONDARY TO  ELEVATED LEFT VENTRICULAR FILLING PRESSURES   COLONOSCOPY     ~ age [age]   COLONOSCOPY W/ POLYPECTOMY     I & D EXTREMITY Bilateral 10/24/2019   Procedure: IRRIGATION AND DEBRIDEMENT BILATERAL EXTREMITY WOUND ON FOOT;  Surgeon: Allena Napoleon, MD;  Location: [facility] OR;  Service: Plastics;  Laterality: Bilateral;   METATARSAL HEAD EXCISION Right 08/29/2018   Procedure: METATARSAL HEAD RESECTION;  Surgeon: Felecia Shelling, DPM;  Location: [facility] OR;  Service: Podiatry;  Laterality: Right;   METATARSAL OSTEOTOMY Right 08/29/2018   Procedure: SUB FIFTHE METATARSIA RIGHT FOOT;  Surgeon: Felecia Shelling, DPM;  Location: [facility] OR;  Service: Podiatry;  Laterality: Right;   MULTIPLE EXTRACTIONS WITH ALVEOLOPLASTY  01/27/2014   "all my teeth; 4 Quadrants of alveoloplasty   MULTIPLE EXTRACTIONS WITH ALVEOLOPLASTY N/A 01/27/2014   Procedure: EXTRACTION OF TEETH #'1, 2, 3, 4, 5, 6, 7, 8, 9, 10, 11, 12, 13, 14, 15, 16, 17, 20, 21, 22, 23, 24, 25, 26, 27, 28, 29, 31 and 32 WITH ALVEOLOPLASTY;  Surgeon: Charlynne Pander, DDS;  Location: [facility] OR;  Service: Oral Surgery;  Laterality: N/A;  ORIF FINGER / THUMB FRACTURE Right    POLYPECTOMY     RIGHT HEART CATH N/A 01/27/2021   Procedure: RIGHT HEART CATH;  Surgeon: Dolores Patty, MD;  Location: [facility];  Service: Cardiovascular;  Laterality: N/A;   RIGHT/LEFT HEART CATH AND CORONARY ANGIOGRAPHY N/A 09/26/2017   Procedure: RIGHT/LEFT HEART CATH AND CORONARY ANGIOGRAPHY;  Surgeon: Dolores Patty, MD;  Location: [facility];  Service: Cardiovascular;  Laterality: N/A;   RIGHT/LEFT HEART CATH AND CORONARY ANGIOGRAPHY N/A 04/12/2021   Procedure: RIGHT/LEFT HEART CATH AND CORONARY ANGIOGRAPHY;  Surgeon: Dolores Patty, MD;  Location: [facility];  Service:  Cardiovascular;  Laterality: N/A;   SKIN SPLIT GRAFT Bilateral 10/24/2019   Procedure: SKIN GRAFT SPLIT THICKNESS LEFT THIGH;  Surgeon: Allena Napoleon, MD;  Location: [facility] OR;  Service: Plastics;  Laterality: Bilateral;   WOUND DEBRIDEMENT Right 08/29/2018   Procedure: Debridement of ulcer on right fifth metatarsal;  Surgeon: Felecia Shelling, DPM;  Location: [facility] OR;  Service: Podiatry;  Laterality: Right;    Family History  Problem Relation Age of Onset   Heart disease Mother        MI   Heart failure Mother    Diabetes Mother        ALSO IN MOST OF HIS SIBLINGS; 2 UNLCES HAVE ALSO PASSED AWAY FROM DM   Cardiomyopathy Mother    Cancer - Ovarian Mother    Ovarian cancer Mother    Heart disease Father    Hypertension Father    Diabetes Father    Diabetes Sister    Diabetes Brother    Colon cancer Paternal Uncle    Colon cancer Paternal Uncle    Colon polyps Neg Hx    Esophageal cancer Neg Hx    Rectal cancer Neg Hx    Stomach cancer Neg Hx     Social History   Tobacco Use   Smoking status: Never   Smokeless tobacco: Never  Vaping Use   Vaping Use: Never used  Substance Use Topics   Alcohol use: No   Drug use: No    Prior to Admission medications   Medication Sig Start Date End Date Taking? Authorizing Provider  amitriptyline (ELAVIL) 10 MG tablet TAKE 1 TABLET BY MOUTH AT BEDTIME 06/21/21   [name], Drema Pry, MD  ascorbic acid (VITAMIN C) 250 MG tablet Take 1 tablet (250 mg total) by mouth daily. 02/18/21   Love, Evlyn Kanner, PA-C  aspirin 81 MG EC tablet Take 1 tablet (81 mg total) by mouth daily. 04/13/21   Danford, Earl Lites, MD  carvedilol (COREG) 12.5 MG tablet Take 1.5 tablets (18.75 mg total) by mouth 2 (two) times daily with a meal. 06/01/21   Milford, Anderson Malta, FNP  cholecalciferol (VITAMIN D3) 25 MCG (1000 UNIT) tablet Take 1,000 Units by mouth daily.    [provider]  clopidogrel (PLAVIX) 75 MG tablet Take 1 tablet (75 mg total) by mouth daily. 04/20/21    Milford, Anderson Malta, FNP  dapagliflozin propanediol (FARXIGA) 10 MG TABS tablet Take 1 tablet (10 mg total) by mouth daily before breakfast. 05/04/21   Milford, Anderson Malta, FNP  gabapentin (NEURONTIN) 100 MG capsule Take 1 capsule (100 mg total) by mouth at bedtime. 03/21/21   Persons, [place], PA  hydrocerin (EUCERIN) CREA Apply 1 application topically 2 (two) times daily. To left foot/leg 02/17/21   Love, Evlyn Kanner, PA-C  Insulin Glargine [facility]) 100 UNIT/ML Inject  20 Units into the skin every morning. 06/08/21   Renato Shin, MD  methocarbamol (ROBAXIN) 500 MG tablet Take 1 tablet (500 mg total) by mouth every 6 (six) hours as needed for muscle spasms. 02/17/21   Love, Ivan Anchors, PA-C  oxyCODONE-acetaminophen (PERCOCET/ROXICET) 5-325 MG tablet Take 1 tablet by mouth every 4 (four) hours as needed for severe pain. 06/29/21   [name], MD  pantoprazole (PROTONIX) 40 MG tablet Take 40 mg by mouth as needed.    [provider]  potassium chloride SA (KLOR-CON) 20 MEQ tablet Take 1 tablet with Torsemide. Patient taking differently: as needed. Take 1 tablet with Torsemide. 04/21/21   Joette Catching, PA-C  rosuvastatin (CRESTOR) 40 MG tablet Take 1 tablet (40 mg total) by mouth daily. 04/20/21   Milford, Maricela Bo, FNP  sacubitril-valsartan (ENTRESTO) 97-103 MG Take 1 tablet by mouth 2 (two) times daily. 05/04/21   Rafael Bihari, FNP  spironolactone (ALDACTONE) 25 MG tablet Take 1 tablet (25 mg total) by mouth daily. 04/20/21   Rafael Bihari, FNP  torsemide (DEMADEX) 20 MG tablet Take 1 tablet (20 mg total) by mouth as needed. 04/21/21   Joette Catching, PA-C    Allergies Nsaids   REVIEW OF SYSTEMS  Negative except as noted here or in the History of Present Illness.   PHYSICAL EXAMINATION  Initial Vital Signs Blood pressure (!) 162/92, pulse 87, temperature 98.2 F (36.8 C), temperature source Oral, resp. rate 20, height 5\' 11"  (1.803 m), weight 127.9 kg,  SpO2 100 %.  Examination General: Well-developed, well-nourished male in no acute distress; appearance consistent with age of record HENT: normocephalic; atraumatic Eyes: pupils equal, round and reactive to light; extraocular muscles intact Neck: supple Heart: regular rate and rhythm Lungs: clear to auscultation bilaterally Abdomen: soft; nondistended; nontender; bowel sounds present Extremities: Right BKA; partial avulsion of thickened, left second toenail, elongated left third and fourth toenails:    Neurologic: Awake, alert and oriented; motor function intact in all extremities and symmetric; no facial droop; decreased pain sensation in toes of left foot Skin: Warm and dry; patchy hypopigmentation of left foot Psychiatric: Normal mood and affect   RESULTS  Summary of this visit's results, reviewed and interpreted by myself:   EKG Interpretation  Date/Time:    Ventricular Rate:    PR Interval:    QRS Duration:   QT Interval:    QTC Calculation:   R Axis:     Text Interpretation:         Laboratory Studies: No results found for this or any previous visit (from the past 24 hour(s)). Imaging Studies: No results found.  ED COURSE and MDM  Nursing notes, initial and subsequent vitals signs, including pulse oximetry, reviewed and interpreted by myself.  Vitals:   07/03/21 0019  BP: (!) 162/92  Pulse: 87  Resp: 20  Temp: 98.2 F (36.8 C)  TempSrc: Oral  SpO2: 100%  Weight: 127.9 kg  Height: 5\' 11"  (1.803 m)   Medications - No data to display    PROCEDURES  .Nail Removal  Date/Time: 07/03/2021 12:34 AM Performed by: Hyder Deman, MD Authorized by: Latoy Labriola, MD   Consent:    Consent obtained:  Verbal   Risks discussed:  Pain Universal protocol:    Procedure explained and questions answered to patient or proxy's satisfaction: yes     Patient identity confirmed:  Verbally with patient Location:    Toe location: Left second, third and fourth  toes. Nails trimmed:    Number of nails trimmed:  3 Post-procedure details:    Dressing:  Antibiotic ointment   Procedure completion:  Tolerated well, no immediate complications    ED DIAGNOSES     ICD-10-CM   1. Nail avulsion, toe, initial encounter  S91.209A     2. Onychomycosis of toenail  B35.1          Stanton Kissoon, MD 07/03/21 [phone]

## 2021-07-03 NOTE — ED Notes (Signed)
Wound irrigated with saline Dressing applied with antibiotic ointment, guaze, kling & tape

## 2021-07-03 NOTE — ED Triage Notes (Signed)
Pt to ED via [facility] from home c/o toe nail bleed. Second toe left foot. Pt clipped toenail and bleeding. On arrival bleeding controlled, HX DM , R NKA. Last VS: 120 PALP, 96%ra 92HR. Pt voicing no complaints.

## 2021-07-04 ENCOUNTER — Other Ambulatory Visit: Payer: Self-pay

## 2021-07-04 ENCOUNTER — Encounter: Payer: Self-pay | Admitting: Physical Therapy

## 2021-07-04 ENCOUNTER — Ambulatory Visit (INDEPENDENT_AMBULATORY_CARE_PROVIDER_SITE_OTHER): Payer: No Typology Code available for payment source | Admitting: Physical Therapy

## 2021-07-04 ENCOUNTER — Telehealth: Payer: Self-pay | Admitting: Orthopedic Surgery

## 2021-07-04 ENCOUNTER — Ambulatory Visit (INDEPENDENT_AMBULATORY_CARE_PROVIDER_SITE_OTHER): Payer: No Typology Code available for payment source | Admitting: Orthopedic Surgery

## 2021-07-04 DIAGNOSIS — Z89511 Acquired absence of right leg below knee: Secondary | ICD-10-CM

## 2021-07-04 DIAGNOSIS — R293 Abnormal posture: Secondary | ICD-10-CM | POA: Diagnosis not present

## 2021-07-04 DIAGNOSIS — M6281 Muscle weakness (generalized): Secondary | ICD-10-CM | POA: Diagnosis not present

## 2021-07-04 DIAGNOSIS — S91105A Unspecified open wound of left lesser toe(s) without damage to nail, initial encounter: Secondary | ICD-10-CM | POA: Diagnosis not present

## 2021-07-04 DIAGNOSIS — R2681 Unsteadiness on feet: Secondary | ICD-10-CM

## 2021-07-04 DIAGNOSIS — Z9181 History of falling: Secondary | ICD-10-CM

## 2021-07-04 DIAGNOSIS — R2689 Other abnormalities of gait and mobility: Secondary | ICD-10-CM

## 2021-07-04 NOTE — Telephone Encounter (Signed)
Pt called and states he had to go to [facility] for his toe nail. The emergency dept told pt to follow up with Mitchell [name] so pt is wondering if he could be seen at 1:00 because he rides transportation with [facility] and he already has PT appt at 11:45.   CB [phone]

## 2021-07-04 NOTE — Telephone Encounter (Signed)
I scheduled pt at 1pm today with Dr. Lajoyce Corners. Pt is informed.
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
## 2021-07-05 ENCOUNTER — Encounter: Payer: Self-pay | Admitting: Orthopedic Surgery
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
## 2021-07-06 ENCOUNTER — Encounter: Payer: Self-pay | Admitting: Physical Therapy

## 2021-07-06 ENCOUNTER — Ambulatory Visit (INDEPENDENT_AMBULATORY_CARE_PROVIDER_SITE_OTHER): Payer: No Typology Code available for payment source | Admitting: Physical Therapy

## 2021-07-06 ENCOUNTER — Other Ambulatory Visit: Payer: Self-pay

## 2021-07-06 DIAGNOSIS — R2681 Unsteadiness on feet: Secondary | ICD-10-CM | POA: Diagnosis not present

## 2021-07-06 DIAGNOSIS — R293 Abnormal posture: Secondary | ICD-10-CM | POA: Diagnosis not present

## 2021-07-06 DIAGNOSIS — M6281 Muscle weakness (generalized): Secondary | ICD-10-CM | POA: Diagnosis not present

## 2021-07-06 DIAGNOSIS — R2689 Other abnormalities of gait and mobility: Secondary | ICD-10-CM

## 2021-07-06 NOTE — Therapy (Signed)
[facility] [address] [place], [place], [address] Phone: [phone]   Fax:  [phone]  Physical Therapy Treatment  Patient Details  Name: Mitchell [name] MRN: [id number] Date of Birth: 11-12-1970 Referring Provider (PT): Aldean Baker, MD   Encounter Date: 07/06/2021   PT End of Session - 07/06/21 1131     Visit Number 17    Number of Visits 25    Date for PT Re-Evaluation 07/28/21    Authorization Type [employer] / [employer] $15 copay no visit limit    PT Start Time 1131    PT Stop Time 1221    PT Time Calculation (min) 50 min    Equipment Utilized During Treatment Gait belt    Activity Tolerance Patient tolerated treatment well;Patient limited by fatigue    Behavior During Therapy [facility] for tasks assessed/performed   tearful reports concerns with vision changes            Past Medical History:  Diagnosis Date   Bipolar disorder (HCC)    CAD (coronary artery disease)    a. diffuse 3v CAD by cath 2019, medical therapy recommended.   Cataract    forming    Chronic systolic CHF (congestive heart failure) (HCC)    CVA (cerebral infarction)    No residual deficits   Depression    PTSD,    Diabetes mellitus    TYPE II; UNCONTROLLED BY HEMOGLOBIN A1c; STABLE AS  PER DISCHARGE   Headache(784.0)    Herpes simplex of male genitalia    History of colonic polyps    Hyperlipidemia    Hypertension    Myocardial infarction (HCC) 1987   (while playing football)   Obesity    OSA (obstructive sleep apnea)    repeat study 2018 without significant OSA   Pneumonia    Post-cardiac injury syndrome (HCC)    History of cardiac injury from blunt trauma   Pulmonary hypertension (HCC)    a. moderately elevated PASP 07/2019.   PVCs (premature ventricular contractions)    Schizophrenia (HCC)    Goes to [facility]   Sleep apnea    Stroke [facility]) 2005   some left side weakness   Syncope    Recurrent, thought to be vasovagal. Also has h/o  frequent PVCs.     Past Surgical History:  Procedure Laterality Date   AMPUTATION Right 01/25/2021   Procedure: RIGHT BELOW KNEE AMPUTATION;  Surgeon: Nadara Mustard, MD;  Location: [facility] OR;  Service: Orthopedics;  Laterality: Right;   CARDIAC CATHETERIZATION  12/19/10   DIFFUSE NONOBSTRUCTIVE CAD; NONISCHEMIC CARDIOMYOPATHY; LEFT VENTRICULAR ANGIOGRAM WAS PERFORMED SECONDARY TO  ELEVATED LEFT VENTRICULAR FILLING PRESSURES   COLONOSCOPY     ~ age [age]   COLONOSCOPY W/ POLYPECTOMY     I & D EXTREMITY Bilateral 10/24/2019   Procedure: IRRIGATION AND DEBRIDEMENT BILATERAL EXTREMITY WOUND ON FOOT;  Surgeon: Allena Napoleon, MD;  Location: [facility] OR;  Service: Plastics;  Laterality: Bilateral;   METATARSAL HEAD EXCISION Right 08/29/2018   Procedure: METATARSAL HEAD RESECTION;  Surgeon: Felecia Shelling, DPM;  Location: [facility] OR;  Service: Podiatry;  Laterality: Right;   METATARSAL OSTEOTOMY Right 08/29/2018   Procedure: SUB FIFTHE METATARSIA RIGHT FOOT;  Surgeon: Felecia Shelling, DPM;  Location: [facility] OR;  Service: Podiatry;  Laterality: Right;   MULTIPLE EXTRACTIONS WITH ALVEOLOPLASTY  01/27/2014   "all my teeth; 4 Quadrants of alveoloplasty   MULTIPLE EXTRACTIONS WITH ALVEOLOPLASTY N/A 01/27/2014   Procedure: EXTRACTION OF TEETH #'  1, 2, 3, 4, 5, 6, 7, 8, 9, 10, 11, 12, 13, 14, 15, 16, 17, 20, 21, 22, 23, 24, 25, 26, 27, 28, 29, 31 and 32 WITH ALVEOLOPLASTY;  Surgeon: Charlynne Pander, DDS;  Location: [facility] OR;  Service: Oral Surgery;  Laterality: N/A;   ORIF FINGER / THUMB FRACTURE Right    POLYPECTOMY     RIGHT HEART CATH N/A 01/27/2021   Procedure: RIGHT HEART CATH;  Surgeon: Dolores Patty, MD;  Location: [facility];  Service: Cardiovascular;  Laterality: N/A;   RIGHT/LEFT HEART CATH AND CORONARY ANGIOGRAPHY N/A 09/26/2017   Procedure: RIGHT/LEFT HEART CATH AND CORONARY ANGIOGRAPHY;  Surgeon: Dolores Patty, MD;  Location: [facility];  Service: Cardiovascular;  Laterality: N/A;    RIGHT/LEFT HEART CATH AND CORONARY ANGIOGRAPHY N/A 04/12/2021   Procedure: RIGHT/LEFT HEART CATH AND CORONARY ANGIOGRAPHY;  Surgeon: Dolores Patty, MD;  Location: [facility];  Service: Cardiovascular;  Laterality: N/A;   SKIN SPLIT GRAFT Bilateral 10/24/2019   Procedure: SKIN GRAFT SPLIT THICKNESS LEFT THIGH;  Surgeon: Allena Napoleon, MD;  Location: [facility] OR;  Service: Plastics;  Laterality: Bilateral;   WOUND DEBRIDEMENT Right 08/29/2018   Procedure: Debridement of ulcer on right fifth metatarsal;  Surgeon: Felecia Shelling, DPM;  Location: [facility] OR;  Service: Podiatry;  Laterality: Right;    There were no vitals filed for this visit.   Subjective Assessment - 07/06/21 1132     Subjective His prosthesis is doing well.  Dr. Lajoyce Corners said his limb is healing well and he can go back on Plavix.    Pertinent History NSTEMI 9/12/022, HFrEF, ischemic cardiomyopathy EF 20-25%,, CAD, HTN, hyperlipidemia, DM2, PVD s/p right BKA, COVID-19 in June/2022, schizophrenia, prior CVA    Patient Stated Goals He would like to use prosthesis for community    Currently in Pain? No/denies                               [facility] Adult PT Treatment/Exercise - 07/06/21 1132       Transfers   Transfers Sit to Stand;Stand to Sit    Sit to Stand 5: Supervision;With upper extremity assist;With armrests;From chair/3-in-1;Other (comment)    Stand to Sit 5: Supervision;With upper extremity assist;With armrests;To chair/3-in-1;Other (comment)      Ambulation/Gait   Ambulation/Gait Yes    Ambulation/Gait Assistance 5: Supervision    Ambulation Distance (Feet) 200 Feet   200' cane in/out of clinic and 100' X 3 without AD   Assistive device Prosthesis;None   enter/exit with cane,  session without AD   Ramp 5: Supervision   with prosthesis only   Curb 5: Supervision   with prosthesis only   Gait Comments HR 85-90 bpm throughout all standing tasks, SaO2 96%-100% throughout PT session.  Pt had low blood  glucose of 80 during session so PT gave him Coke & Goldfish which improved his symptoms.      Exercises   Exercises Knee/Hip      Knee/Hip Exercises: Aerobic   Nustep Seat 13 level 8 with BLEs / BUEs for 8 min      Knee/Hip Exercises: Machines for Strengthening   Total Gym Leg Press shuttle leg press position 2 131+# 2 sets 15 reps 1st set back 45* & 2nd set back flat      Knee/Hip Exercises: Standing   Rocker Board --    Rocker Board Limitations --  Prosthetics   Prosthetic Care Comments  Pt had LLE 2nd toe covered with 2 bandaids.  Upon pt request, PT assessed toenail area which looks not infected. he reports Dr. Lajoyce Corners said he did not need Bandaid. PT advised that may be not keeping area too moist plus he risk skin tear when taking Bandaid off.  PT recommended going slow when removing sock from toe area to avoid pulling loose quickly created bleed. He verbalized understanding.    Current prosthetic wear tolerance (days/week)  daily    Current prosthetic wear tolerance (#hours/day)  most of awake hours checking Vivewear shrinker for dampness every 4-5 hours.    Current prosthetic weight-bearing tolerance (hours/day)  Patient tolerated standing 5 minutes & gait with RW for PWB on prosthesis with no c/o limb discomfort    Residual limb condition  medial wound only 0.2cm with healthy red granulation, dry scab removed, no hair growth, normal moisture & temperature, intermittent phantom pain, no limb pain, cylinderical shape    Education Provided Other (comment)   see prosthtetic care comments   Person(s) Educated Patient    Education Method Verbal cues;Explanation    Education Method Verbal cues required    Donning Prosthesis Modified independent (device/increased time)    Doffing Prosthesis Modified independent (device/increased time)                       PT Short Term Goals - 06/29/21 1654       PT SHORT TERM GOAL #1   Title Patient verbalizes proper adjustment of  prosthetic ply socks with limb volume changes.    Time 4    Period Weeks    Status Achieved    Target Date 06/30/21      PT SHORT TERM GOAL #2   Title Patient tolerates prosthesis >12 hrs total /day without skin issues increasing.    Time 4    Period Weeks    Status Achieved    Target Date 06/30/21      PT SHORT TERM GOAL #3   Title Patient able to pick up item from floor without UE support with supervision    Time 4    Period Weeks    Status Achieved    Target Date 06/30/21      PT SHORT TERM GOAL #4   Title Patient ambulates 200' with cane & prosthesis with supervision verbal cues only for deviation no loss of balance    Time 4    Period Weeks    Status Achieved    Target Date 06/30/21      PT SHORT TERM GOAL #5   Title Patient negotiates ramps & curbs with cane & prosthesis with supervision.    Time 4    Period Weeks    Status Achieved    Target Date 06/30/21               PT Long Term Goals - 05/30/21 1107       PT LONG TERM GOAL #1   Title Patient demonstrates & verbalized understanding of prosthetic care to enable safe utilization of prosthesis.    Time 12    Period Weeks    Status On-going    Target Date 07/28/21      PT LONG TERM GOAL #2   Title Patient tolerates prosthesis wear >90% of awake hours without skin or limb pain issues.    Time 12    Period Weeks    Status On-going  Target Date 07/28/21      PT LONG TERM GOAL #3   Title Berg Balance >/= 45/56 to indicate lower fall risk    Time 12    Period Weeks    Status On-going    Target Date 07/28/21      PT LONG TERM GOAL #4   Title Patient ambulates 300' with LRAD & prosthesis modified independent    Time 12    Period Weeks    Status On-going    Target Date 07/28/21      PT LONG TERM GOAL #5   Title Patient negotiates ramps, curbs & stairs with LRAD & prosthesis modified independent.    Time 12    Period Weeks    Status On-going    Target Date 07/28/21                    Plan - 07/06/21 1131     Clinical Impression Statement Today's PT session limited by low blood glucose.  His prosthetic gait & balance are slowly improving.  Patient continues to benefit from skilled PT.    Personal Factors and Comorbidities Comorbidity 3+;Fitness;Time since onset of injury/illness/exacerbation    Comorbidities NSTEMI 9/12/022, HFrEF, ischemic cardiomyopathy EF 20-25%,, CAD, HTN, hyperlipidemia, DM2, PVD s/p right BKA, COVID-19 in June/2022, schizophrenia, prior CVA    Examination-Activity Limitations Lift;Locomotion Level;Reach Overhead;Squat;Stairs;Stand;Transfers    Examination-Participation Restrictions Community Activity    Stability/Clinical Decision Making Evolving/Moderate complexity    Rehab Potential Good    PT Frequency 2x / week    PT Duration 12 weeks    PT Treatment/Interventions ADLs/Self Care Home Management;DME Instruction;Gait training;Stair training;Functional mobility training;Therapeutic activities;Therapeutic exercise;Balance training;Neuromuscular re-education;Patient/family education;Prosthetic Training;Passive range of motion;Vestibular    PT Next Visit Plan cont to monitor HR with activities,  check wounds, continue exercises to work on strength, endurance & balance and prosthetic gait without assistive device    PT Home Exercise Plan HEP at sink see pt instructions 05/10/2021    Consulted and Agree with Plan of Care Patient             Patient will benefit from skilled therapeutic intervention in order to improve the following deficits and impairments:  Abnormal gait, Decreased activity tolerance, Decreased balance, Decreased coordination, Decreased endurance, Decreased knowledge of use of DME, Decreased mobility, Decreased skin integrity, Decreased strength, Increased edema, Postural dysfunction, Prosthetic Dependency, Obesity  Visit Diagnosis: Other abnormalities of gait and mobility  Unsteadiness on feet  Muscle weakness  (generalized)  Abnormal posture     Problem List Patient Active Problem List   Diagnosis Date Noted   Diabetic retinopathy associated with type 2 diabetes mellitus (HCC) 06/06/2021   Acute on chronic HFrEF (heart failure with reduced ejection fraction) (HCC) 04/11/2021   CAD (coronary artery disease) 04/11/2021   S/P below knee amputation, right (HCC) 02/07/2021   Cutaneous abscess of right foot    Diabetic foot infection (HCC)    SOB (shortness of breath)    Sepsis (HCC) 01/23/2021   Cellulitis 01/23/2021   Elevated troponin 01/23/2021   Acute metabolic encephalopathy 01/23/2021   AKI (acute kidney injury) (HCC) 01/23/2021   COVID-19 virus infection 01/23/2021   Wound infection 10/21/2019   Burn, foot, second degree, unspecified laterality, sequela 10/21/2019   QT prolongation 07/30/2019   Acute exacerbation of CHF (congestive heart failure) (HCC) 07/30/2019   Acute on chronic combined systolic and diastolic CHF (congestive heart failure) (HCC) 07/29/2019   Foot ulcer (HCC) 03/11/2018   Weakness  05/08/2017   Leukocytosis 05/08/2017   Stroke (HCC) 05/08/2017   Slurred speech 05/08/2017   Uncontrolled type 2 diabetes mellitus with hyperglycemia, with long-term current use of insulin (HCC) 12/07/2015   Noncompliance 01/22/2014   Chronic systolic heart failure (HCC) 10/02/2011   Erectile dysfunction 10/02/2011   Obstructive sleep apnea 10/11/2007   Diabetes mellitus with complication (HCC) 10/10/2007   HLD (hyperlipidemia) 10/10/2007   HYPOKALEMIA 10/10/2007   Obesity, Class III, BMI 40-49.9 (morbid obesity) (HCC) 10/10/2007   Essential hypertension 10/10/2007   PREMATURE VENTRICULAR CONTRACTIONS 10/10/2007   SYNCOPE 10/10/2007   COLONIC POLYPS, HX OF 10/10/2007    Vladimir Faster, PT, DPT 07/06/2021, 12:21 PM  [facility] [address] [place], [place], [address] Phone: [phone]   Fax:  [phone]  Name: Mitchell [name] MRN:  [id number] Date of Birth: 09-Jul-1971

## 2021-07-07 ENCOUNTER — Telehealth (HOSPITAL_COMMUNITY): Payer: Self-pay

## 2021-07-07 NOTE — Telephone Encounter (Signed)
Pt contacted me this morning to try to resch our morning appoint to this afternoon, unfortunately I do not have any time this afternoon to change it.  He reports loose bowels still going on this week despite the stopping of his trulicity, he is going to contact his PCP and hope to get in today or tomorrow to review med list and see what else can be done.    He is scared to eat too much as it may cause more diarrhea but I told him he had to try to eat/drink more so he doesn't get dehydrated either.  He feels it may be one  of his other meds causing this issue.    I will f/u with him next week.   Kerry Hough, EMT-Paramedic  07/07/21

## 2021-07-08 ENCOUNTER — Ambulatory Visit (INDEPENDENT_AMBULATORY_CARE_PROVIDER_SITE_OTHER): Payer: No Typology Code available for payment source | Admitting: Family

## 2021-07-08 ENCOUNTER — Encounter: Payer: Self-pay | Admitting: Family

## 2021-07-08 VITALS — BP 134/80 | HR 79 | Temp 98.8°F | Ht 71.0 in | Wt 275.0 lb

## 2021-07-08 DIAGNOSIS — E1169 Type 2 diabetes mellitus with other specified complication: Secondary | ICD-10-CM | POA: Diagnosis not present

## 2021-07-08 DIAGNOSIS — Z794 Long term (current) use of insulin: Secondary | ICD-10-CM
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
## 2021-07-09 LAB — COMPREHENSIVE METABOLIC PANEL
AG Ratio: 1.3 (calc) (ref 1.0–2.5)
ALT: 12 U/L (ref 9–46)
AST: 14 U/L (ref 10–35)
Albumin: 4 g/dL (ref 3.6–5.1)
Alkaline phosphatase (APISO): 60 U/L (ref 35–144)
BUN/Creatinine Ratio: 14 (calc) (ref 6–22)
BUN: 19 mg/dL (ref 7–25)
CO2: 22 mmol/L (ref 20–32)
Calcium: 9.3 mg/dL (ref 8.6–10.3)
Chloride: 111 mmol/L — ABNORMAL HIGH (ref 98–110)
Creat: 1.33 mg/dL — ABNORMAL HIGH (ref 0.70–1.30)
Globulin: 3 g/dL (calc) (ref 1.9–3.7)
Glucose, Bld: 160 mg/dL — ABNORMAL HIGH (ref 65–99)
Potassium: 4.3 mmol/L (ref 3.5–5.3)
Sodium: 144 mmol/L (ref 135–146)
Total Bilirubin: 0.3 mg/dL (ref 0.2–1.2)
Total Protein: 7 g/dL (ref 6.1–8.1)

## 2021-07-11 ENCOUNTER — Ambulatory Visit: Payer: No Typology Code available for payment source | Admitting: Orthopedic Surgery

## 2021-07-11 ENCOUNTER — Other Ambulatory Visit: Payer: Self-pay

## 2021-07-11 ENCOUNTER — Encounter: Payer: Self-pay | Admitting: Physical Therapy

## 2021-07-11 ENCOUNTER — Ambulatory Visit (INDEPENDENT_AMBULATORY_CARE_PROVIDER_SITE_OTHER): Payer: No Typology Code available for payment source | Admitting: Physical Therapy

## 2021-07-11 DIAGNOSIS — R2681 Unsteadiness on feet: Secondary | ICD-10-CM | POA: Diagnosis not present

## 2021-07-11 DIAGNOSIS — R2689 Other abnormalities of gait and mobility: Secondary | ICD-10-CM

## 2021-07-11 DIAGNOSIS — R293 Abnormal posture: Secondary | ICD-10-CM

## 2021-07-11 DIAGNOSIS — M6281 Muscle weakness (generalized): Secondary | ICD-10-CM | POA: Diagnosis not present

## 2021-07-11 NOTE — Progress Notes (Addendum)
Subjective:    Patient ID: Mitchell [name], male    DOB: 10/29/1970, 50 y.o.   MRN: [id number]  HPI Mitchell [name] is a 50 year old man who presents for hospital follow-up of R BKA.   Not having much pain in the residual limb. Sometimes he has pain. He takes Percocet or Oxycodone prescribed by his surgeon Dr. Lajoyce Corners.  The phantom limb pain recurs sometimes. He had a heart attack earlier this week- he says his heart is functioning well.  He has been using his prosthesis.  He will be starting with the walker.  He just received his medications from the hospital. -eyes are his pain problem. He saw his specialist and was told he has diabetic retinopathy -he feels well but the bright light bothers him. -he uses special glasses to help him see better. -he has not been sleeping well He is very tired today  Pain Inventory Average Pain 6 Pain Right Now 0 My pain is intermittent and stabbing  In the last 24 hours, has pain interfered with the following? General activity 0 Relation with others 0 Enjoyment of life 0 What TIME of day is your pain at its worst? daytime Sleep (in general) Poor  Pain is worse with: sitting Pain improves with: rest, medication, and elevation of stump Relief from Meds: 5  use a wheelchair needs help with transfers transfers alone  disabled: date disabled 01/25/21 I need assistance with the following:  meal prep, household duties, and shopping  trouble walking spasms depression  Any changes since last visit?  yes  Just got out of [facility] after having an MI on 04/11/21  Any changes since last visit?  no    Family History  Problem Relation Age of Onset   Heart disease Mother        MI   Heart failure Mother    Diabetes Mother        ALSO IN MOST OF HIS SIBLINGS; 2 UNLCES HAVE ALSO PASSED AWAY FROM DM   Cardiomyopathy Mother    Cancer - Ovarian Mother    Ovarian cancer Mother    Heart disease Father    Hypertension Father    Diabetes Father     Diabetes Sister    Diabetes Brother    Colon cancer Paternal Uncle    Colon cancer Paternal Uncle    Colon polyps Neg Hx    Esophageal cancer Neg Hx    Rectal cancer Neg Hx    Stomach cancer Neg Hx    Social History   Socioeconomic History   Marital status: Married    Spouse name: Not on file   Number of children: 5   Years of education: Not on file   Highest education level: Not on file  Occupational History   Occupation: Mortician  Tobacco Use   Smoking status: Never   Smokeless tobacco: Never  Vaping Use   Vaping Use: Never used  Substance and Sexual Activity   Alcohol use: No   Drug use: No   Sexual activity: Not on file  Other Topics Concern   Not on file  Social History Narrative   MARRIED, LIVES IN [place] WITH WIFE; GREW UP IN [place] AND USED TO BE A COOK; HE ENJOYS COOKING AND ENJOYS EATING A LOT OF PORK AND SALT.   Social Determinants of Health   Financial Resource Strain: Medium Risk   Difficulty of Paying Living Expenses: Somewhat hard  Food Insecurity: No Food Insecurity  Worried About Programme researcher, broadcasting/film/video in the Last Year: Never true   [employer] of Food in the Last Year: Never true  Transportation Needs: No Transportation Needs   Lack of Transportation (Medical): No   Lack of Transportation (Non-Medical): No  Physical Activity: Not on file  Stress: Not on file  Social Connections: Not on file   Past Surgical History:  Procedure Laterality Date   AMPUTATION Right 01/25/2021   Procedure: RIGHT BELOW KNEE AMPUTATION;  Surgeon: Nadara Mustard, MD;  Location: [facility] OR;  Service: Orthopedics;  Laterality: Right;   CARDIAC CATHETERIZATION  12/19/10   DIFFUSE NONOBSTRUCTIVE CAD; NONISCHEMIC CARDIOMYOPATHY; LEFT VENTRICULAR ANGIOGRAM WAS PERFORMED SECONDARY TO  ELEVATED LEFT VENTRICULAR FILLING PRESSURES   COLONOSCOPY     ~ age [age]   COLONOSCOPY W/ POLYPECTOMY     I & D EXTREMITY Bilateral 10/24/2019   Procedure: IRRIGATION AND DEBRIDEMENT BILATERAL  EXTREMITY WOUND ON FOOT;  Surgeon: Allena Napoleon, MD;  [facility];  Service: Plastics;  Laterality: Bilateral;   METATARSAL HEAD EXCISION Right 08/29/2018   Procedure: METATARSAL HEAD RESECTION;  Surgeon: Felecia Shelling, DPM;  [facility];  Service: Podiatry;  Laterality: Right;   METATARSAL OSTEOTOMY Right 08/29/2018   Procedure: SUB FIFTHE METATARSIA RIGHT FOOT;  Surgeon: Felecia Shelling, DPM;  [facility];  Service: Podiatry;  Laterality: Right;   MULTIPLE EXTRACTIONS WITH ALVEOLOPLASTY  01/27/2014   "all my teeth; 4 Quadrants of alveoloplasty   MULTIPLE EXTRACTIONS WITH ALVEOLOPLASTY N/A 01/27/2014   Procedure: EXTRACTION OF TEETH #'1, 2, 3, 4, 5, 6, 7, 8, 9, 10, 11, 12, 13, 14, 15, 16, 17, 20, 21, 22, 23, 24, 25, 26, 27, 28, 29, 31 and 32 WITH ALVEOLOPLASTY;  Surgeon: Charlynne Pander, DDS;  [facility];  Service: Oral Surgery;  Laterality: N/A;   ORIF FINGER / THUMB FRACTURE Right    POLYPECTOMY     RIGHT HEART CATH N/A 01/27/2021   Procedure: RIGHT HEART CATH;  Surgeon: Dolores Patty, MD;  [facility];  Service: Cardiovascular;  Laterality: N/A;   RIGHT/LEFT HEART CATH AND CORONARY ANGIOGRAPHY N/A 09/26/2017   Procedure: RIGHT/LEFT HEART CATH AND CORONARY ANGIOGRAPHY;  Surgeon: Dolores Patty, MD;  [facility];  Service: Cardiovascular;  Laterality: N/A;   RIGHT/LEFT HEART CATH AND CORONARY ANGIOGRAPHY N/A 04/12/2021   Procedure: RIGHT/LEFT HEART CATH AND CORONARY ANGIOGRAPHY;  Surgeon: Dolores Patty, MD;  [facility];  Service: Cardiovascular;  Laterality: N/A;   SKIN SPLIT GRAFT Bilateral 10/24/2019   Procedure: SKIN GRAFT SPLIT THICKNESS LEFT THIGH;  Surgeon: Allena Napoleon, MD;  [facility];  Service: Plastics;  Laterality: Bilateral;   WOUND DEBRIDEMENT Right 08/29/2018   Procedure: Debridement of ulcer on right fifth metatarsal;  Surgeon: Felecia Shelling, DPM;  [facility];  Service: Podiatry;   Laterality: Right;   Past Medical History:  Diagnosis Date   Bipolar disorder (HCC)    CAD (coronary artery disease)    a. diffuse 3v CAD by cath 2019, medical therapy recommended.   Cataract    forming    Chronic systolic CHF (congestive heart failure) (HCC)    CVA (cerebral infarction)    No residual deficits   Depression    PTSD,    Diabetes mellitus    TYPE II; UNCONTROLLED BY HEMOGLOBIN A1c; STABLE AS  PER DISCHARGE   Headache(784.0)    Herpes simplex of male genitalia  History of colonic polyps    Hyperlipidemia    Hypertension    Myocardial infarction (HCC) 1987   (while playing football)   Obesity    OSA (obstructive sleep apnea)    repeat study 2018 without significant OSA   Pneumonia    Post-cardiac injury syndrome (HCC)    History of cardiac injury from blunt trauma   Pulmonary hypertension (HCC)    a. moderately elevated PASP 07/2019.   PVCs (premature ventricular contractions)    Schizophrenia (HCC)    [facility] to [facility]   Sleep apnea    Stroke [facility]) 2005   some left side weakness   Syncope    Recurrent, thought to be vasovagal. Also has h/o frequent PVCs.    BP (!) 147/92   Pulse 72   Ht 5\' 11"  (1.803 m)   Wt 275 lb (124.7 kg) Comment: pt reported, in wheelchair  SpO2 98%   BMI 38.35 kg/m  weight was last recorded  Opioid Risk Score:   Fall Risk Score:  `1  Depression screen PHQ 2/9  Depression screen [facility] 2/9 07/08/2021 04/15/2021 09/06/2018 07/18/2018 06/13/2018  Decreased Interest 0 - 0 0 1  Down, Depressed, Hopeless 0 1 0 0 1  PHQ - 2 Score 0 1 0 0 2  Altered sleeping - 1 - - 1  Tired, decreased energy - 0 - - 1  Change in appetite - 0 - - 1  Feeling bad or failure about yourself  - 1 - - 1  Trouble concentrating - 1 - - 1  Moving slowly or fidgety/restless - 0 - - 1  Suicidal thoughts - 0 - - 1  PHQ-9 Score - 4 - - 9  Some recent data might be hidden     Review of Systems  Constitutional: Negative.   HENT:  Negative.    Eyes: Negative.   Respiratory: Negative.    Cardiovascular: Negative.   Gastrointestinal: Negative.   Genitourinary: Negative.   Musculoskeletal:  Positive for gait problem.  Skin: Negative.   Allergic/Immunologic: Negative.   Hematological:  Bruises/bleeds easily.       Plavix  Psychiatric/Behavioral:  Positive for dysphoric mood.   All other systems reviewed and are negative.     Objective:   Physical Exam Gen: no distress, normal appearing HEENT: oral mucosa pink and moist, NCAT Cardio: Reg rate Chest: normal effort, normal rate of breathing Abd: soft, non-distended Ext: no edema Psych: pleasant, normal affect Skin: intact Neuro: Alert and oriented x3. Yawning frequently, quite somnolent.  Musculoskeletal: Residual limb healing well. Prosthesis in place    Assessment & Plan:  Mitchell [name] is a 50 year old man who presents for hospital follow-up after CIR admission for right BKA.  1) Right BKA: -continue follow-up with Dr. 44 -continue follow-up with [facility] for prosthesis.  -continue PT -Discussed current symptoms of pain and history of pain.  -Discussed benefits of exercise in reducing pain. -Provided with a pain relief journal and discussed that it contains foods and lifestyle tips to naturally help to improve pain. Discussed that these lifestyle strategies are also very good for health unlike some medications which can have negative side effects. Discussed that the act of keeping a journal can be therapeutic and helpful to realize patterns what helps to trigger and alleviate pain.   -Discussed following foods that may reduce pain: 1) Ginger (especially studied for arthritis)- reduce leukotriene production to decrease inflammation 2) Blueberries- high in phytonutrients that decrease inflammation 3)  Salmon- marine omega-3s reduce joint swelling and pain 4) Pumpkin seeds- reduce inflammation 5) dark chocolate- reduces inflammation 6) turmeric- reduces  inflammation 7) tart cherries - reduce pain and stiffness 8) extra virgin olive oil - its compound olecanthal helps to block prostaglandins  9) chili peppers- can be eaten or applied topically via capsaicin 10) mint- helpful for headache, muscle aches, joint pain, and itching 11) garlic- reduces inflammation  Link to further information on diet for chronic pain: [email or web address]   2) Diabetic retinopathy -avoid bright lights. -continue follow-up with eye doctor -prescribed Dexcom transmitter/received/sensor -check CBGs daily, log, and bring log to follow-up appointment -avoid sugar, bread, pasta, rice -avoid snacking -perform daily foot exam and at least annual eye exam -try to incorporate into your diet some of the following foods which are good for diabetes: 1) cinnamon- imitates effects of insulin, increasing glucose transport into cells ([place] or [place] cinnamon is best, least processed) 2) nuts- can slow down the blood sugar response of carbohydrate rich foods 3) oatmeal- contains and anti-inflammatory compound avenanthramide 4) whole-milk yogurt (best types are no sugar, [place] yogurt, or goat/sheep yogurt) 5) beans- high in protein, fiber, and vitamins, low glycemic index 6) broccoli- great source of vitamin A and C 7) quinoa- higher in protein and fiber than other grains 8) spinach- high in vitamin A, fiber, and protein 9) olive oil- reduces glucose levels, LDL, and triglycerides 10) salmon- excellent amount of omega-3-fatty acids 11) walnuts- rich in antioxidants 12) apples- high in fiber and quercetin 13) carrots- highly nutritious with low impact on blood sugar 14) eggs- improve HDL (good cholesterol), high in protein, keep you satiated 15) turmeric: improves blood sugars, cardiovascular disease, and protects kidney health 16) garlic: improves blood sugar, blood pressure, pain 17) tomatoes:  highly nutritious with low impact on blood sugar   3) PTSD -cannot tolerate Seroquel  4) Residual limb pain: -continue pain medication as per Dr. Lajoyce Corners  5)Bipolar disorder: -encouraged follow-up with mental health  6) Insomnia: -Try to go outside near sunrise -Get exercise during the day.  -Discussed good sleep hygiene: turning off all devices an hour before bedtime.  -Chamomile and valerian tea with dinner.  -Can consider over the counter melatonin -discussed amitripytyline 10mg  HS  >40 minutes spent in discussion of his pain, his Mitchell [name], her CBGs, his faith, provided with a pain relief journal, disucussing therapy, follow-up with diabetic retinopathy surgery, discussed his usual work and limitations, disability paperwork, prescribed Dexcom G6 sensor/transmitter/receiver, his difficulties in getting Medicaid to help him.

## 2021-07-11 NOTE — Therapy (Signed)
[facility] Physical Therapy [address] [place], [place], [address] Phone: [phone]   Fax:  [phone]  Physical Therapy Treatment  Patient Details  Name: Mitchell [name] MRN: [id number] Date of Birth: Apr 15, 1971 Referring Provider (PT): Aldean Baker, MD   Encounter Date: 07/11/2021   PT End of Session - 07/11/21 1141     Visit Number 18    Number of Visits 25    Date for PT Re-Evaluation 07/28/21    Authorization Type [employer] / [employer] $15 copay no visit limit    PT Start Time 1141    PT Stop Time 1229    PT Time Calculation (min) 48 min    Equipment Utilized During Treatment Gait belt    Activity Tolerance Patient tolerated treatment well;Patient limited by fatigue    Behavior During Therapy [facility] for tasks assessed/performed   tearful reports concerns with vision changes            Past Medical History:  Diagnosis Date   Bipolar disorder (HCC)    CAD (coronary artery disease)    a. diffuse 3v CAD by cath 2019, medical therapy recommended.   Cataract    forming    Chronic systolic CHF (congestive heart failure) (HCC)    CVA (cerebral infarction)    No residual deficits   Depression    PTSD,    Diabetes mellitus    TYPE II; UNCONTROLLED BY HEMOGLOBIN A1c; STABLE AS  PER DISCHARGE   Headache(784.0)    Herpes simplex of male genitalia    History of colonic polyps    Hyperlipidemia    Hypertension    Myocardial infarction (HCC) 1987   (while playing football)   Obesity    OSA (obstructive sleep apnea)    repeat study 2018 without significant OSA   Pneumonia    Post-cardiac injury syndrome (HCC)    History of cardiac injury from blunt trauma   Pulmonary hypertension (HCC)    a. moderately elevated PASP 07/2019.   PVCs (premature ventricular contractions)    Schizophrenia (HCC)    Goes to [facility]   Sleep apnea    Stroke [facility]) 2005   some left side weakness   Syncope    Recurrent, thought to be vasovagal. Also has h/o  frequent PVCs.     Past Surgical History:  Procedure Laterality Date   AMPUTATION Right 01/25/2021   Procedure: RIGHT BELOW KNEE AMPUTATION;  Surgeon: Nadara Mustard, MD;  Location: [facility] OR;  Service: Orthopedics;  Laterality: Right;   CARDIAC CATHETERIZATION  12/19/10   DIFFUSE NONOBSTRUCTIVE CAD; NONISCHEMIC CARDIOMYOPATHY; LEFT VENTRICULAR ANGIOGRAM WAS PERFORMED SECONDARY TO  ELEVATED LEFT VENTRICULAR FILLING PRESSURES   COLONOSCOPY     ~ age 6-23   COLONOSCOPY W/ POLYPECTOMY     I & D EXTREMITY Bilateral 10/24/2019   Procedure: IRRIGATION AND DEBRIDEMENT BILATERAL EXTREMITY WOUND ON FOOT;  Surgeon: Allena Napoleon, MD;  Location: [facility] OR;  Service: Plastics;  Laterality: Bilateral;   METATARSAL HEAD EXCISION Right 08/29/2018   Procedure: METATARSAL HEAD RESECTION;  Surgeon: Felecia Shelling, DPM;  Location: [facility] OR;  Service: Podiatry;  Laterality: Right;   METATARSAL OSTEOTOMY Right 08/29/2018   Procedure: SUB FIFTHE METATARSIA RIGHT FOOT;  Surgeon: Felecia Shelling, DPM;  Location: [facility] OR;  Service: Podiatry;  Laterality: Right;   MULTIPLE EXTRACTIONS WITH ALVEOLOPLASTY  01/27/2014   "all my teeth; 4 Quadrants of alveoloplasty   MULTIPLE EXTRACTIONS WITH ALVEOLOPLASTY N/A 01/27/2014   Procedure: EXTRACTION OF TEETH #'  1, 2, 3, 4, 5, 6, 7, 8, 9, 10, 11, 12, 13, 14, 15, 16, 17, 20, 21, 22, 23, 24, 25, 26, 27, 28, 29, 31 and 32 WITH ALVEOLOPLASTY;  Surgeon: Charlynne Pander, DDS;  Location: [facility] OR;  Service: Oral Surgery;  Laterality: N/A;   ORIF FINGER / THUMB FRACTURE Right    POLYPECTOMY     RIGHT HEART CATH N/A 01/27/2021   Procedure: RIGHT HEART CATH;  Surgeon: Dolores Patty, MD;  Location: [facility] INVASIVE CV LAB;  Service: Cardiovascular;  Laterality: N/A;   RIGHT/LEFT HEART CATH AND CORONARY ANGIOGRAPHY N/A 09/26/2017   Procedure: RIGHT/LEFT HEART CATH AND CORONARY ANGIOGRAPHY;  Surgeon: Dolores Patty, MD;  [facility]: [facility] INVASIVE CV LAB;  Service: Cardiovascular;  Laterality: N/A;    RIGHT/LEFT HEART CATH AND CORONARY ANGIOGRAPHY N/A 04/12/2021   Procedure: RIGHT/LEFT HEART CATH AND CORONARY ANGIOGRAPHY;  Surgeon: Dolores Patty, MD;  Location: [facility] INVASIVE CV LAB;  Service: Cardiovascular;  Laterality: N/A;   SKIN SPLIT GRAFT Bilateral 10/24/2019   Procedure: SKIN GRAFT SPLIT THICKNESS LEFT THIGH;  Surgeon: Allena Napoleon, MD;  Location: [facility] OR;  Service: Plastics;  Laterality: Bilateral;   WOUND DEBRIDEMENT Right 08/29/2018   Procedure: Debridement of ulcer on right fifth metatarsal;  Surgeon: Felecia Shelling, DPM;  Location: [facility] OR;  Service: Podiatry;  Laterality: Right;    There were no vitals filed for this visit.   Subjective Assessment - 07/11/21 1142     Subjective He saw PCP who feels intestinal issues may be related to how much OJ.  He is to not use Trulicity.    Pertinent History NSTEMI 9/12/022, HFrEF, ischemic cardiomyopathy EF 20-25%,, CAD, HTN, hyperlipidemia, DM2, PVD s/p right BKA, COVID-19 in June/2022, schizophrenia, prior CVA    Patient Stated Goals He would like to use prosthesis for community    Currently in Pain? No/denies                               [facility] Adult PT Treatment/Exercise - 07/11/21 1142       Transfers   Transfers Sit to Stand;Stand to Sit    Sit to Stand 5: Supervision;With upper extremity assist;With armrests;From chair/3-in-1;Other (comment)    Stand to Sit 5: Supervision;With upper extremity assist;With armrests;To chair/3-in-1;Other (comment)      Ambulation/Gait   Ambulation/Gait Yes    Ambulation/Gait Assistance 5: Supervision    Ambulation Distance (Feet) 200 Feet   200' cane in/out of clinic and 100' X 3 without AD   Assistive device Prosthesis;None   enter/exit with cane,  session without AD   Ramp 5: Supervision   with prosthesis only   Curb 5: Supervision   with prosthesis only   Gait Comments HR 85-90 bpm throughout all standing tasks, SaO2 96%-100% throughout PT session.      Exercises    Exercises Knee/Hip      Knee/Hip Exercises: Stretches   Active Hamstring Stretch Both;2 reps;30 seconds    Active Hamstring Stretch Limitations supine SLR w/ strap      Knee/Hip Exercises: Aerobic   Nustep Seat 13 level 8 with BLEs / BUEs for 8 min      Knee/Hip Exercises: Machines for Strengthening   Total Gym Leg Press shuttle leg press position 2 131# 2 sets 15 reps 1st set back 45* & 2nd set back flat      Knee/Hip Exercises: Standing   Rocker Board 2  minutes   ant/level/post & right/level/left, round board w/single pivot point, BUE support with light weight bearing   Rocker Board Limitations verbal cues      Prosthetics   Prosthetic Care Comments  --    Current prosthetic wear tolerance (days/week)  daily    Current prosthetic wear tolerance (#hours/day)  most of awake hours checking Vivewear shrinker for dampness every 4-5 hours.    Current prosthetic weight-bearing tolerance (hours/day)  Patient tolerated standing 5 minutes & gait with RW for PWB on prosthesis with no c/o limb discomfort    Residual limb condition  medial wound only 0.2cm with healthy red granulation, dry scab removed, no hair growth, normal moisture & temperature, intermittent phantom pain, no limb pain, cylinderical shape    Education Provided Other (comment)   see prosthtetic care comments                Balance Exercises - 07/11/21 1142       Balance Exercises: Standing   Standing Eyes Opened Head turns;Foam/compliant surface;5 reps   4 directions head turns, feet hip width apart   Standing Eyes Opened Limitations PT tactile cues on balance    Standing Eyes Closed Solid surface;5 reps;Head turns;Narrow base of support (BOS)   4 directions   Standing Eyes Closed Limitations PT tactile cues on balance    Stepping Strategy Anterior;Posterior;Foam/compliant surface;5 reps;Lateral   anticipatory stepping off foam beam & stabilizing without touching, progressed to reactionary   Stepping Strategy  Limitations tactile cues for stabilization, intermittent UE to stabilize                  PT Short Term Goals - 06/29/21 1654       PT SHORT TERM GOAL #1   Title Patient verbalizes proper adjustment of prosthetic ply socks with limb volume changes.    Time 4    Period Weeks    Status Achieved    Target Date 06/30/21      PT SHORT TERM GOAL #2   Title Patient tolerates prosthesis >12 hrs total /day without skin issues increasing.    Time 4    Period Weeks    Status Achieved    Target Date 06/30/21      PT SHORT TERM GOAL #3   Title Patient able to pick up item from floor without UE support with supervision    Time 4    Period Weeks    Status Achieved    Target Date 06/30/21      PT SHORT TERM GOAL #4   Title Patient ambulates 200' with cane & prosthesis with supervision verbal cues only for deviation no loss of balance    Time 4    Period Weeks    Status Achieved    Target Date 06/30/21      PT SHORT TERM GOAL #5   Title Patient negotiates ramps & curbs with cane & prosthesis with supervision.    Time 4    Period Weeks    Status Achieved    Target Date 06/30/21               PT Long Term Goals - 05/30/21 1107       PT LONG TERM GOAL #1   Title Patient demonstrates & verbalized understanding of prosthetic care to enable safe utilization of prosthesis.    Time 12    Period Weeks    Status On-going    Target Date 07/28/21  PT LONG TERM GOAL #2   Title Patient tolerates prosthesis wear >90% of awake hours without skin or limb pain issues.    Time 12    Period Weeks    Status On-going    Target Date 07/28/21      PT LONG TERM GOAL #3   Title Berg Balance >/= 45/56 to indicate lower fall risk    Time 12    Period Weeks    Status On-going    Target Date 07/28/21      PT LONG TERM GOAL #4   Title Patient ambulates 300' with LRAD & prosthesis modified independent    Time 12    Period Weeks    Status On-going    Target Date 07/28/21       PT LONG TERM GOAL #5   Title Patient negotiates ramps, curbs & stairs with LRAD & prosthesis modified independent.    Time 12    Period Weeks    Status On-going    Target Date 07/28/21                   Plan - 07/11/21 1141     Clinical Impression Statement Patient's balance was improved today.  He is on target to meet LTGs by end of this month.  Patient continues to benefit from skilled PT.    Personal Factors and Comorbidities Comorbidity 3+;Fitness;Time since onset of injury/illness/exacerbation    Comorbidities NSTEMI 9/12/022, HFrEF, ischemic cardiomyopathy EF 20-25%,, CAD, HTN, hyperlipidemia, DM2, PVD s/p right BKA, COVID-19 in June/2022, schizophrenia, prior CVA    Examination-Activity Limitations Lift;Locomotion Level;Reach Overhead;Squat;Stairs;Stand;Transfers    Examination-Participation Restrictions Community Activity    Stability/Clinical Decision Making Evolving/Moderate complexity    Rehab Potential Good    PT Frequency 2x / week    PT Duration 12 weeks    PT Treatment/Interventions ADLs/Self Care Home Management;DME Instruction;Gait training;Stair training;Functional mobility training;Therapeutic activities;Therapeutic exercise;Balance training;Neuromuscular re-education;Patient/family education;Prosthetic Training;Passive range of motion;Vestibular    PT Next Visit Plan cont to monitor HR with activities,  check wounds, continue exercises to work on strength, endurance & balance and prosthetic gait without assistive device    PT Home Exercise Plan HEP at sink see pt instructions 05/10/2021    Consulted and Agree with Plan of Care Patient             Patient will benefit from skilled therapeutic intervention in order to improve the following deficits and impairments:  Abnormal gait, Decreased activity tolerance, Decreased balance, Decreased coordination, Decreased endurance, Decreased knowledge of use of DME, Decreased mobility, Decreased skin integrity,  Decreased strength, Increased edema, Postural dysfunction, Prosthetic Dependency, Obesity  Visit Diagnosis: Other abnormalities of gait and mobility  Unsteadiness on feet  Muscle weakness (generalized)  Abnormal posture     Problem List Patient Active Problem List   Diagnosis Date Noted   Diabetic retinopathy associated with type 2 diabetes mellitus (HCC) 06/06/2021   Acute on chronic HFrEF (heart failure with reduced ejection fraction) (HCC) 04/11/2021   CAD (coronary artery disease) 04/11/2021   S/P below knee amputation, right (HCC) 02/07/2021   Cutaneous abscess of right foot    Diabetic foot infection (HCC)    SOB (shortness of breath)    Sepsis (HCC) 01/23/2021   Cellulitis 01/23/2021   Elevated troponin 01/23/2021   Acute metabolic encephalopathy 01/23/2021   AKI (acute kidney injury) (HCC) 01/23/2021   COVID-19 virus infection 01/23/2021   Wound infection 10/21/2019   Burn, foot, second degree, unspecified laterality, sequela  10/21/2019   QT prolongation 07/30/2019   Acute exacerbation of CHF (congestive heart failure) (HCC) 07/30/2019   Acute on chronic combined systolic and diastolic CHF (congestive heart failure) (HCC) 07/29/2019   Foot ulcer (HCC) 03/11/2018   Weakness 05/08/2017   Leukocytosis 05/08/2017   Stroke (HCC) 05/08/2017   Slurred speech 05/08/2017   Uncontrolled type 2 diabetes mellitus with hyperglycemia, with long-term current use of insulin (HCC) 12/07/2015   Noncompliance 01/22/2014   Chronic systolic heart failure (HCC) 10/02/2011   Erectile dysfunction 10/02/2011   Obstructive sleep apnea 10/11/2007   Diabetes mellitus with complication (HCC) 10/10/2007   HLD (hyperlipidemia) 10/10/2007   HYPOKALEMIA 10/10/2007   Obesity, Class III, BMI 40-49.9 (morbid obesity) (HCC) 10/10/2007   Essential hypertension 10/10/2007   PREMATURE VENTRICULAR CONTRACTIONS 10/10/2007   SYNCOPE 10/10/2007   COLONIC POLYPS, HX OF 10/10/2007    Vladimir Faster,  PT, DPT 07/11/2021, 12:31 PM  [facility] Physical Therapy [address] [place], [place], [address] Phone: [phone]   Fax:  [phone]  Name: Mitchell [name] MRN: [id number] Date of Birth: 1971-04-28

## 2021-07-12 ENCOUNTER — Other Ambulatory Visit (HOSPITAL_COMMUNITY): Payer: Self-pay

## 2021-07-12 NOTE — Progress Notes (Signed)
Paramedicine Encounter    Patient ID: DHARMENDER MCPHEE, male    DOB: 04-Apr-1971, 50 y.o.   MRN: [id number]   Patient Care Team: [name], MD as PCP - General (Family Medicine) Bensimhon, Shaune Pascal, MD as PCP - Cardiology (Cardiology)  Patient Active Problem List   Diagnosis Date Noted   Diabetic retinopathy associated with type 2 diabetes mellitus ([place]) 06/06/2021   Acute on chronic HFrEF (heart failure with reduced ejection fraction) ([place]) 04/11/2021   CAD (coronary artery disease) 04/11/2021   S/P below knee amputation, right ([place]) 02/07/2021   Cutaneous abscess of right foot    Diabetic foot infection ([place])    SOB (shortness of breath)    Sepsis ([place]) 01/23/2021   Cellulitis 01/23/2021   Elevated troponin [id number]   Acute metabolic encephalopathy [id number]   AKI (acute kidney injury) ([place]) 01/23/2021   COVID-19 virus infection 01/23/2021   Wound infection 10/21/2019   Burn, foot, second degree, unspecified laterality, sequela 10/21/2019   QT prolongation 07/30/2019   Acute exacerbation of CHF (congestive heart failure) ([place]) 07/30/2019   Acute on chronic combined systolic and diastolic CHF (congestive heart failure) ([place]) 07/29/2019   Foot ulcer ([place]) 03/11/2018   Weakness 05/08/2017   Leukocytosis 05/08/2017   Stroke ([place]) 05/08/2017   Slurred speech 05/08/2017   Uncontrolled type 2 diabetes mellitus with hyperglycemia, with long-term current use of insulin ([place]) 12/07/2015   Noncompliance 99991111   Chronic systolic heart failure ([place]) 10/02/2011   Erectile dysfunction 10/02/2011   Obstructive sleep apnea 10/11/2007   Diabetes mellitus with complication ([place]) 0000000   HLD (hyperlipidemia) 10/10/2007   HYPOKALEMIA 10/10/2007   Obesity, Class III, BMI 40-49.9 (morbid obesity) ([place]) 10/10/2007   Essential hypertension 10/10/2007   PREMATURE VENTRICULAR CONTRACTIONS 10/10/2007   SYNCOPE 10/10/2007   COLONIC POLYPS, HX OF 10/10/2007    Current  Outpatient Medications:    amitriptyline (ELAVIL) 10 MG tablet, TAKE 1 TABLET BY MOUTH AT BEDTIME, Disp: 30 tablet, Rfl: 0   ascorbic acid (VITAMIN C) 250 MG tablet, Take 1 tablet (250 mg total) by mouth daily., Disp: 30 tablet, Rfl: 0   aspirin 81 MG EC tablet, Take 1 tablet (81 mg total) by mouth daily., Disp: 30 tablet, Rfl: 12   carvedilol (COREG) 12.5 MG tablet, Take 1.5 tablets (18.75 mg total) by mouth 2 (two) times daily with a meal., Disp: 90 tablet, Rfl: 5   cholecalciferol (VITAMIN D3) 25 MCG (1000 UNIT) tablet, Take 1,000 Units by mouth daily., Disp: , Rfl:    clopidogrel (PLAVIX) 75 MG tablet, Take 1 tablet (75 mg total) by mouth daily., Disp: 30 tablet, Rfl: 3   dapagliflozin propanediol (FARXIGA) 10 MG TABS tablet, Take 1 tablet (10 mg total) by mouth daily before breakfast., Disp: 30 tablet, Rfl: 11   gabapentin (NEURONTIN) 100 MG capsule, Take 1 capsule (100 mg total) by mouth at bedtime., Disp: 30 capsule, Rfl: 0   hydrocerin (EUCERIN) CREA, Apply 1 application topically 2 (two) times daily. To left foot/leg, Disp: 454 g, Rfl: 0   Insulin Glargine (BASAGLAR KWIKPEN) 100 UNIT/ML, Inject 20 Units into the skin every morning., Disp: 30 mL, Rfl: 3   methocarbamol (ROBAXIN) 500 MG tablet, Take 1 tablet (500 mg total) by mouth every 6 (six) hours as needed for muscle spasms., Disp: 60 tablet, Rfl: 0   pantoprazole (PROTONIX) 40 MG tablet, Take 40 mg by mouth as needed., Disp: , Rfl:    potassium chloride SA (KLOR-CON) 20 MEQ tablet, Take 1  tablet with Torsemide. (Patient taking differently: as needed. Take 1 tablet with Torsemide.), Disp: 15 tablet, Rfl: 3   rosuvastatin (CRESTOR) 40 MG tablet, Take 1 tablet (40 mg total) by mouth daily., Disp: 30 tablet, Rfl: 3   sacubitril-valsartan (ENTRESTO) 97-103 MG, Take 1 tablet by mouth 2 (two) times daily., Disp: 60 tablet, Rfl: 11   spironolactone (ALDACTONE) 25 MG tablet, Take 1 tablet (25 mg total) by mouth daily., Disp: 30 tablet, Rfl: 3    torsemide (DEMADEX) 20 MG tablet, Take 1 tablet (20 mg total) by mouth as needed., Disp: 15 tablet, Rfl: 3   oxyCODONE-acetaminophen (PERCOCET/ROXICET) 5-325 MG tablet, Take 1 tablet by mouth every 4 (four) hours as needed for severe pain. (Patient not taking: Reported on 07/12/2021), Disp: 30 tablet, Rfl: 0 Allergies  Allergen Reactions   Nsaids Anaphylaxis and Other (See Comments)    Able to tolerate aspirin    Trulicity [Dulaglutide] Nausea Only      Social History   Socioeconomic History   Marital status: Married    Spouse name: Not on file   Number of children: 5   Years of education: Not on file   Highest education level: Not on file  Occupational History   Occupation: Mortician  Tobacco Use   Smoking status: Never   Smokeless tobacco: Never  Vaping Use   Vaping Use: Never used  Substance and Sexual Activity   Alcohol use: No   Drug use: No   Sexual activity: Not on file  Other Topics Concern   Not on file  Social History Narrative   MARRIED, LIVES IN [place] WITH WIFE; 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   Social Determinants of Health   Financial Resource Strain: Medium Risk   Difficulty of Paying Living Expenses: Somewhat hard  Food Insecurity: No Food Insecurity   Worried About Programme researcher, broadcasting/film/video in the Last Year: Never true   Ran Out of Food in the Last Year: Never true  Transportation Needs: No Transportation Needs   Lack of Transportation (Medical): No   Lack of Transportation (Non-Medical): No  Physical Activity: Not on file  Stress: Not on file  Social Connections: Not on file  Intimate Partner Violence: Not on file    Physical Exam      Future Appointments  Date Time Provider Department Center  07/13/2021 11:45 AM Fraser Din, PT OC-OPT None  07/15/2021 10:40 AM [name], Drema Pry, MD CPR-PRMA CPR  07/18/2021 11:45 AM Fraser Din, PT OC-OPT None   07/19/2021  1:00 PM Hilbert Corrigan, PsyD LBBH-DWB DWB  07/20/2021 11:45 AM Fraser Din, PT OC-OPT None  07/26/2021 11:45 AM Fraser Din, PT OC-OPT None  07/27/2021 11:00 AM Altamese Cabal, RD NDM-NMCH NDM  07/28/2021 11:45 AM Fraser Din, PT OC-OPT None  08/08/2021 10:20 AM [name], Gwenlyn Found, MD LBPC-SW PEC  08/19/2021 11:00 AM Romero Belling, MD LBPC-LBENDO None  09/13/2021 10:00 AM MC ECHO OP 1 MC-ECHOLAB [facility]  09/13/2021 11:00 AM Bensimhon, [name], MD MC-HVSC None  10/03/2021 10:45 AM Nadara Mustard, MD OC-GSO None    BP (!) 142/82    Pulse 88    Resp 18    SpO2 98%   Weight yesterday-? Last visit weight-?  Pt reports he is still having GI upset. He said someone told him to take flour with water to help coat stomach and  harden bowels, he tried that and seems to help short term, but then his stomach will bubble again.  He is no longer taking the trulicity. Last dose of that was 2 wks ago.  He is c/o pain behind his eyes like towards the back of his head.  He denies any falls. No slurred speech, no new facial droop.  He thinks a doc told him before that pain was coming from his eye issues.  He asked for assistance in filling his pill box so I helped with that.  He just taking his meds during our visit at 4pm.   He has one tablet left of farxiga.  He called that in to refill.  It will be ready tonight.   He will need to order rosuvastatin, spiro, clopidogrel next week.   He said he cant find his scales he used to have and that's why he hasnt been weighing since he got his prosthetic.    Marylouise Stacks, [facility] Paramedic  07/12/21

## 2021-07-13 ENCOUNTER — Ambulatory Visit (INDEPENDENT_AMBULATORY_CARE_PROVIDER_SITE_OTHER): Payer: No Typology Code available for payment source | Admitting: Physical Therapy

## 2021-07-13 ENCOUNTER — Encounter: Payer: Self-pay | Admitting: Physical Therapy

## 2021-07-13 ENCOUNTER — Other Ambulatory Visit: Payer: Self-pay

## 2021-07-13 DIAGNOSIS — R293 Abnormal posture: Secondary | ICD-10-CM | POA: Diagnosis not present

## 2021-07-13 DIAGNOSIS — M6281 Muscle weakness (generalized): Secondary | ICD-10-CM | POA: Diagnosis not present

## 2021-07-13 DIAGNOSIS — R2689 Other abnormalities of gait and mobility: Secondary | ICD-10-CM

## 2021-07-13 DIAGNOSIS — R2681 Unsteadiness on feet: Secondary | ICD-10-CM | POA: Diagnosis not present

## 2021-07-13 NOTE — Therapy (Signed)
[facility] [address] [place], [place], [address] Phone: [phone]   Fax:  [phone]  Physical Therapy Treatment  Patient Details  Name: Mitchell [name] MRN: [id number] Date of Birth: 18-Nov-1970 Referring Provider (PT): Aldean Baker, MD   Encounter Date: 07/13/2021   PT End of Session - 07/13/21 1154     Visit Number 19    Number of Visits 25    Date for PT Re-Evaluation 07/28/21    Authorization Type [employer] / [employer] $15 copay no visit limit    PT Start Time 1146    PT Stop Time 1230    PT Time Calculation (min) 44 min    Equipment Utilized During Treatment Gait belt    Activity Tolerance Patient tolerated treatment well;Patient limited by fatigue    Behavior During Therapy [facility] for tasks assessed/performed   tearful reports concerns with vision changes            Past Medical History:  Diagnosis Date   Bipolar disorder (HCC)    CAD (coronary artery disease)    a. diffuse 3v CAD by cath 2019, medical therapy recommended.   Cataract    forming    Chronic systolic CHF (congestive heart failure) (HCC)    CVA (cerebral infarction)    No residual deficits   Depression    PTSD,    Diabetes mellitus    TYPE II; UNCONTROLLED BY HEMOGLOBIN A1c; STABLE AS  PER DISCHARGE   Headache(784.0)    Herpes simplex of male genitalia    History of colonic polyps    Hyperlipidemia    Hypertension    Myocardial infarction (HCC) 1987   (while playing football)   Obesity    OSA (obstructive sleep apnea)    repeat study 2018 without significant OSA   Pneumonia    Post-cardiac injury syndrome (HCC)    History of cardiac injury from blunt trauma   Pulmonary hypertension (HCC)    a. moderately elevated PASP 07/2019.   PVCs (premature ventricular contractions)    Schizophrenia (HCC)    Goes to [facility]   Sleep apnea    Stroke [facility]) 2005   some left side weakness   Syncope    Recurrent, thought to be vasovagal. Also has h/o  frequent PVCs.     Past Surgical History:  Procedure Laterality Date   AMPUTATION Right 01/25/2021   Procedure: RIGHT BELOW KNEE AMPUTATION;  Surgeon: Nadara Mustard, MD;  Location: [facility] OR;  Service: Orthopedics;  Laterality: Right;   CARDIAC CATHETERIZATION  12/19/10   DIFFUSE NONOBSTRUCTIVE CAD; NONISCHEMIC CARDIOMYOPATHY; LEFT VENTRICULAR ANGIOGRAM WAS PERFORMED SECONDARY TO  ELEVATED LEFT VENTRICULAR FILLING PRESSURES   COLONOSCOPY     ~ age [age]   COLONOSCOPY W/ POLYPECTOMY     I & D EXTREMITY Bilateral 10/24/2019   Procedure: IRRIGATION AND DEBRIDEMENT BILATERAL EXTREMITY WOUND ON FOOT;  Surgeon: Allena Napoleon, MD;  Location: [facility] OR;  Service: Plastics;  Laterality: Bilateral;   METATARSAL HEAD EXCISION Right 08/29/2018   Procedure: METATARSAL HEAD RESECTION;  Surgeon: Felecia Shelling, DPM;  Location: [facility] OR;  Service: Podiatry;  Laterality: Right;   METATARSAL OSTEOTOMY Right 08/29/2018   Procedure: SUB FIFTHE METATARSIA RIGHT FOOT;  Surgeon: Felecia Shelling, DPM;  Location: [facility] OR;  Service: Podiatry;  Laterality: Right;   MULTIPLE EXTRACTIONS WITH ALVEOLOPLASTY  01/27/2014   "all my teeth; 4 Quadrants of alveoloplasty   MULTIPLE EXTRACTIONS WITH ALVEOLOPLASTY N/A 01/27/2014   Procedure: EXTRACTION OF TEETH #'  1, 2, 3, 4, 5, 6, 7, 8, 9, 10, 11, 12, 13, 14, 15, 16, 17, 20, 21, 22, 23, 24, 25, 26, 27, 28, 29, 31 and 32 WITH ALVEOLOPLASTY;  Surgeon: Charlynne Pander, DDS;  Location: [facility] OR;  Service: Oral Surgery;  Laterality: N/A;   ORIF FINGER / THUMB FRACTURE Right    POLYPECTOMY     RIGHT HEART CATH N/A 01/27/2021   Procedure: RIGHT HEART CATH;  Surgeon: Dolores Patty, MD;  Location: [facility] INVASIVE CV LAB;  Service: Cardiovascular;  Laterality: N/A;   RIGHT/LEFT HEART CATH AND CORONARY ANGIOGRAPHY N/A 09/26/2017   Procedure: RIGHT/LEFT HEART CATH AND CORONARY ANGIOGRAPHY;  Surgeon: Dolores Patty, MD;  Location: [facility] INV[facility] CV LAB;  Service: Cardiovascular;  Laterality: N/A;    RIGHT/LEFT HEART CATH AND CORONARY ANGIOGRAPHY N/A 04/12/2021   Procedure: RIGHT/LEFT HEART CATH AND CORONARY ANGIOGRAPHY;  Surgeon: Dolores Patty, MD;  Location: [facility] INVASIVE CV LAB;  Service: Cardiovascular;  Laterality: N/A;   SKIN SPLIT GRAFT Bilateral 10/24/2019   Procedure: SKIN GRAFT SPLIT THICKNESS LEFT THIGH;  Surgeon: Allena Napoleon, MD;  Location: [facility] OR;  Service: Plastics;  Laterality: Bilateral;   WOUND DEBRIDEMENT Right 08/29/2018   Procedure: Debridement of ulcer on right fifth metatarsal;  Surgeon: Felecia Shelling, DPM;  Location: [facility] OR;  Service: Podiatry;  Laterality: Right;    There were no vitals filed for this visit.   Subjective Assessment - 07/13/21 1146     Subjective He is still having issues with his bowels which he feels is reaction to his medications. His thigh where it is under the liner is itching.    Pertinent History NSTEMI 9/12/022, HFrEF, ischemic cardiomyopathy EF 20-25%,, CAD, HTN, hyperlipidemia, DM2, PVD s/p right BKA, COVID-19 in June/2022, schizophrenia, prior CVA    Patient Stated Goals He would like to use prosthesis for community    Currently in Pain? No/denies                               [facility] Adult PT Treatment/Exercise - 07/13/21 1146       Transfers   Transfers Sit to Stand;Stand to Sit    Sit to Stand 5: Supervision;With upper extremity assist;With armrests;From chair/3-in-1;Other (comment)    Stand to Sit 5: Supervision;With upper extremity assist;With armrests;To chair/3-in-1;Other (comment)      Ambulation/Gait   Ambulation/Gait Yes    Ambulation/Gait Assistance 5: Supervision    Ambulation Distance (Feet) 200 Feet    Assistive device Prosthesis;None   enter/exit with cane,  session without AD   Ramp 5: Supervision   with prosthesis only   Curb 5: Supervision   with prosthesis only   Gait Comments HR 85-90 bpm throughout all standing tasks, SaO2 96%-100% throughout PT session.      High Level Balance    High Level Balance Activities Negotiating over obstacles;Other (comment)   worked on increasing pace to "cross a Research scientist (medical) Comments verbal cues for technique      Neuro Re-ed    Neuro Re-ed Details  stepping to 3 cones (ant-lat, lat, post-lat) 5 reps ea LE.      Exercises   Exercises Knee/Hip      Knee/Hip Exercises: Stretches   Active Hamstring Stretch Both;2 reps;30 seconds    Active Hamstring Stretch Limitations supine SLR w/ strap      Knee/Hip Exercises: Aerobic   Nustep Seat 13 level  8 with BLEs / BUEs for 4 min      Knee/Hip Exercises: Machines for Strengthening   Total Gym Leg Press shuttle leg press position 2 131# 2 sets 15 reps 1st set back 45* & 2nd set back flat      Knee/Hip Exercises: Standing   Rocker Board 2 minutes   ant/level/post & right/level/left, round board w/single pivot point, BUE support with light weight bearing   Rocker Board Limitations verbal cues      Prosthetics   Prosthetic Care Comments  PT insstructed to use antiperspirant on residual limb in evening & after morning shower. Itching on thigh is typically related to sweat which increasing itchy sensation.    Current prosthetic wear tolerance (days/week)  daily    Current prosthetic wear tolerance (#hours/day)  most of awake hours checking Vivewear shrinker for dampness every 4-5 hours.    Current prosthetic weight-bearing tolerance (hours/day)  Patient tolerated standing 5 minutes & gait with RW for PWB on prosthesis with no c/o limb discomfort    Residual limb condition  dry scab only 0.3cm long, no hair growth, normal moisture & temperature, intermittent phantom pain, no limb pain, cylinderical shape    Education Provided Other (comment)   see prosthtetic care comments   Person(s) Educated Patient    Education Method Explanation;Verbal cues    Education Method Verbalized understanding    Donning Prosthesis Modified independent (device/increased time)    Doffing Prosthesis  Modified independent (device/increased time)                       PT Short Term Goals - 06/29/21 1654       PT SHORT TERM GOAL #1   Title Patient verbalizes proper adjustment of prosthetic ply socks with limb volume changes.    Time 4    Period Weeks    Status Achieved    Target Date 06/30/21      PT SHORT TERM GOAL #2   Title Patient tolerates prosthesis >12 hrs total /day without skin issues increasing.    Time 4    Period Weeks    Status Achieved    Target Date 06/30/21      PT SHORT TERM GOAL #3   Title Patient able to pick up item from floor without UE support with supervision    Time 4    Period Weeks    Status Achieved    Target Date 06/30/21      PT SHORT TERM GOAL #4   Title Patient ambulates 200' with cane & prosthesis with supervision verbal cues only for deviation no loss of balance    Time 4    Period Weeks    Status Achieved    Target Date 06/30/21      PT SHORT TERM GOAL #5   Title Patient negotiates ramps & curbs with cane & prosthesis with supervision.    Time 4    Period Weeks    Status Achieved    Target Date 06/30/21               PT Long Term Goals - 05/30/21 1107       PT LONG TERM GOAL #1   Title Patient demonstrates & verbalized understanding of prosthetic care to enable safe utilization of prosthesis.    Time 12    Period Weeks    Status On-going    Target Date 07/28/21      PT LONG TERM GOAL #2  Title Patient tolerates prosthesis wear >90% of awake hours without skin or limb pain issues.    Time 12    Period Weeks    Status On-going    Target Date 07/28/21      PT LONG TERM GOAL #3   Title Berg Balance >/= 45/56 to indicate lower fall risk    Time 12    Period Weeks    Status On-going    Target Date 07/28/21      PT LONG TERM GOAL #4   Title Patient ambulates 300' with LRAD & prosthesis modified independent    Time 12    Period Weeks    Status On-going    Target Date 07/28/21      PT LONG TERM  GOAL #5   Title Patient negotiates ramps, curbs & stairs with LRAD & prosthesis modified independent.    Time 12    Period Weeks    Status On-going    Target Date 07/28/21                   Plan - 07/13/21 1154     Clinical Impression Statement Patient fatigues requiring rests between activities.  PT progressed balance activities and functional gait which he improved with instruction & repetition.  Pt continues to benefit from skilled PT to improve safety & function.    Personal Factors and Comorbidities Comorbidity 3+;Fitness;Time since onset of injury/illness/exacerbation    Comorbidities NSTEMI 9/12/022, HFrEF, ischemic cardiomyopathy EF 20-25%,, CAD, HTN, hyperlipidemia, DM2, PVD s/p right BKA, COVID-19 in June/2022, schizophrenia, prior CVA    Examination-Activity Limitations Lift;Locomotion Level;Reach Overhead;Squat;Stairs;Stand;Transfers    Examination-Participation Restrictions Community Activity    Stability/Clinical Decision Making Evolving/Moderate complexity    Rehab Potential Good    PT Frequency 2x / week    PT Duration 12 weeks    PT Treatment/Interventions ADLs/Self Care Home Management;DME Instruction;Gait training;Stair training;Functional mobility training;Therapeutic activities;Therapeutic exercise;Balance training;Neuromuscular re-education;Patient/family education;Prosthetic Training;Passive range of motion;Vestibular    PT Next Visit Plan cont to monitor HR with activities,  check wounds, continue exercises to work on strength, endurance & balance and prosthetic gait without assistive device    PT Home Exercise Plan HEP at sink see pt instructions 05/10/2021    Consulted and Agree with Plan of Care Patient             Patient will benefit from skilled therapeutic intervention in order to improve the following deficits and impairments:  Abnormal gait, Decreased activity tolerance, Decreased balance, Decreased coordination, Decreased endurance, Decreased  knowledge of use of DME, Decreased mobility, Decreased skin integrity, Decreased strength, Increased edema, Postural dysfunction, Prosthetic Dependency, Obesity  Visit Diagnosis: Other abnormalities of gait and mobility  Unsteadiness on feet  Muscle weakness (generalized)  Abnormal posture     Problem List Patient Active Problem List   Diagnosis Date Noted   Diabetic retinopathy associated with type 2 diabetes mellitus (HCC) 06/06/2021   Acute on chronic HFrEF (heart failure with reduced ejection fraction) (HCC) 04/11/2021   CAD (coronary artery disease) 04/11/2021   S/P below knee amputation, right (HCC) 02/07/2021   Cutaneous abscess of right foot    Diabetic foot infection (HCC)    SOB (shortness of breath)    Sepsis (HCC) 01/23/2021   Cellulitis 01/23/2021   Elevated troponin 01/23/2021   Acute metabolic encephalopathy 01/23/2021   AKI (acute kidney injury) (HCC) 01/23/2021   COVID-19 virus infection 01/23/2021   Wound infection 10/21/2019   Burn, foot, second degree, unspecified laterality,  sequela 10/21/2019   QT prolongation 07/30/2019   Acute exacerbation of CHF (congestive heart failure) (HCC) 07/30/2019   Acute on chronic combined systolic and diastolic CHF (congestive heart failure) (HCC) 07/29/2019   Foot ulcer (HCC) 03/11/2018   Weakness 05/08/2017   Leukocytosis 05/08/2017   Stroke (HCC) 05/08/2017   Slurred speech 05/08/2017   Uncontrolled type 2 diabetes mellitus with hyperglycemia, with long-term current use of insulin (HCC) 12/07/2015   Noncompliance 01/22/2014   Chronic systolic heart failure (HCC) 10/02/2011   Erectile dysfunction 10/02/2011   Obstructive sleep apnea 10/11/2007   Diabetes mellitus with complication (HCC) 10/10/2007   HLD (hyperlipidemia) 10/10/2007   HYPOKALEMIA 10/10/2007   Obesity, Class III, BMI 40-49.9 (morbid obesity) (HCC) 10/10/2007   Essential hypertension 10/10/2007   PREMATURE VENTRICULAR CONTRACTIONS 10/10/2007    SYNCOPE 10/10/2007   COLONIC POLYPS, HX OF 10/10/2007    Vladimir Faster, PT, DPT 07/13/2021, 12:30 PM  [facility] [address] [place], [place], [address] Phone: [phone]   Fax:  [phone]  Name: Mitchell [name] MRN: [id number] Date of Birth: 1971-02-20

## 2021-07-15 ENCOUNTER — Other Ambulatory Visit: Payer: Self-pay

## 2021-07-15 ENCOUNTER — Encounter
Payer: No Typology Code available for payment source | Attending: Physical Medicine and Rehabilitation | Admitting: Physical Medicine and Rehabilitation

## 2021-07-15 VITALS — BP 147/92 | HR 72 | Ht 71.0 in | Wt 275.0 lb

## 2021-07-15 DIAGNOSIS — Z89511 Acquired absence of right leg below knee: Secondary | ICD-10-CM | POA: Insufficient documentation

## 2021-07-15 DIAGNOSIS — E11319 Type 2 diabetes mellitus with unspecified diabetic retinopathy without macular edema: Secondary | ICD-10-CM | POA: Insufficient documentation

## 2021-07-15 MED ORDER — DEXCOM G6 TRANSMITTER MISC: 1.0000 | Freq: Every day | 3 refills | Status: DC

## 2021-07-15 MED ORDER — DEXCOM G6 RECEIVER DEVI
1.0000 | Freq: Every day | 3 refills | Status: DC
Start: 2021-07-15 — End: 2021-07-25

## 2021-07-15 MED ORDER — DEXCOM G6 SENSOR MISC: 1.0000 | Freq: Every day | 3 refills | Status: DC

## 2021-07-15 NOTE — Addendum Note (Signed)
Addended by: Horton Chin on: 07/15/2021 11:18 AM   Modules accepted: Orders, Level of Service

## 2021-07-15 NOTE — Patient Instructions (Signed)
Foods which are good for diabetes: 1) cinnamon- imitates effects of insulin, increasing glucose transport into cells ([place] or [place] cinnamon is best, least processed) 2) nuts- can slow down the blood sugar response of carbohydrate rich foods 3) oatmeal- contains and anti-inflammatory compound avenanthramide 4) whole-milk yogurt (best types are no sugar, [place] yogurt, or goat/sheep yogurt) 5) beans- high in protein, fiber, and vitamins, low glycemic index 6) broccoli- great source of vitamin A and C 7) quinoa- higher in protein and fiber than other grains 8) spinach- high in vitamin A, fiber, and protein 9) olive oil- reduces glucose levels, LDL, and triglycerides 10) salmon- excellent amount of omega-3-fatty acids 11) walnuts- rich in antioxidants 12) apples- high in fiber and quercetin 13) carrots- highly nutritious with low impact on blood sugar 14) eggs- improve HDL (good cholesterol), high in protein, keep you satiated 15) turmeric: improves blood sugars, cardiovascular disease, and protects kidney health 16) garlic: improves blood sugar, blood pressure, pain 17) tomatoes: highly nutritious with low impact on blood sugar   Foods that may reduce pain: 1) Ginger (especially studied for arthritis)- reduce leukotriene production to decrease inflammation 2) Blueberries- high in phytonutrients that decrease inflammation 3) Salmon- marine omega-3s reduce joint swelling and pain 4) Pumpkin seeds- reduce inflammation 5) dark chocolate- reduces inflammation 6) turmeric- reduces inflammation 7) tart cherries - reduce pain and stiffness 8) extra virgin olive oil - its compound olecanthal helps to block prostaglandins  9) chili peppers- can be eaten or applied topically via capsaicin 10) mint- helpful for headache, muscle aches, joint pain, and itching 11) garlic- reduces inflammation  Link to further information on diet for chronic pain:  [email or web address]

## 2021-07-18 ENCOUNTER — Encounter: Payer: Self-pay | Admitting: Physical Therapy

## 2021-07-18 ENCOUNTER — Other Ambulatory Visit: Payer: Self-pay

## 2021-07-18 ENCOUNTER — Ambulatory Visit (INDEPENDENT_AMBULATORY_CARE_PROVIDER_SITE_OTHER): Payer: No Typology Code available for payment source | Admitting: Physical Therapy

## 2021-07-18 DIAGNOSIS — M6281 Muscle weakness (generalized): Secondary | ICD-10-CM | POA: Diagnosis not present

## 2021-07-18 DIAGNOSIS — R2681 Unsteadiness on feet: Secondary | ICD-10-CM

## 2021-07-18 DIAGNOSIS — Z9181 History of falling: Secondary | ICD-10-CM

## 2021-07-18 DIAGNOSIS — R2689 Other abnormalities of gait and mobility: Secondary | ICD-10-CM

## 2021-07-18 DIAGNOSIS — R293 Abnormal posture: Secondary | ICD-10-CM | POA: Diagnosis not present
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
## 2021-07-19 ENCOUNTER — Telehealth: Payer: Self-pay

## 2021-07-19 ENCOUNTER — Ambulatory Visit (INDEPENDENT_AMBULATORY_CARE_PROVIDER_SITE_OTHER): Payer: No Typology Code available for payment source | Admitting: Psychologist

## 2021-07-19 DIAGNOSIS — F321 Major depressive disorder, single episode, moderate: Secondary | ICD-10-CM

## 2021-07-19 NOTE — Telephone Encounter (Signed)
PA for [employer], Best boy sent to patient's plan

## 2021-07-19 NOTE — Progress Notes (Signed)
Behavioral Health Counselor/Therapist Progress Note  Patient ID: Mitchell [name], MRN: [id number],    Date: 07/19/2021  Time Spent: 1:05 pm to 1:43 pm; Total Time: 38 minutes   This session was held via in person. The patient consented to in-person therapy and was in the clinician's office. Limits of confidentiality were discussed with the patient.   Treatment Type: Individual Therapy  Reported Symptoms: Reported less depressive symptoms.   Mental Status Exam: Appearance:  Neat     Behavior: Appropriate  Motor: Normal  Speech/Language:  Normal Rate  Affect: Appropriate  Mood: normal  Thought process: normal  Thought content:   WNL  Sensory/Perceptual disturbances:   WNL  Orientation: oriented to person, place, time/date, and situation  Attention: Good  Concentration: Fair  Memory: WNL  Fund of knowledge:  Fair  Insight:   Fair  Judgment:  Fair  Impulse Control: Good   Risk Assessment: Danger to Self:  No Self-injurious Behavior: No Danger to Others: No Duty to Warn:no Physical Aggression / Violence:No  Access to Firearms a concern: No  Gang Involvement:No   Subjective: Beginning the session, patient described himself as doing well. Elaborating, he indicated that he has received positive news from his PCP, which helps to facilitate being in a better mood. From there, he spent the majority of the session reflecting on his faith and how it helps him daily get through challenging circumstances. He ended the session, by indicating that he wants to communicate better with his wife. He was agreeable to homework assignment. He denied suicidal and homicidal ideation.    Interventions:  Worked on developing a therapeutic relationship with the patient using active listening and reflective statements. Provided emotional support using empathy and validation. Praised patient for doing better and explored what has assisted the patient. Processed how patient's values have  assisted the patient. Praised patient for receiving positive news from PCP. Explored how patient communicates with immediate family. Challenged some of the thoughts expressed. Used socratic questions to assist the patient. Explored willingness to look at different ways of communicating with spouse. Processed the idea of writing a letter to spouse. Provided empathic statements. Assessed for suicidal and homicidal ideation.   Homework Write letter of what he wants to express towards wife  Next Session: Review letter and emotional support.   Diagnosis: F32.1 major depressive affective disorder, single episode, moderate  Plan: Client Abilities: Patient is friendly and easy to develop rapport  Client Preferences: Patient voiced interest in ACT and CBT  Client statement of Needs:  Process emotions  Treatment Level: Outpatient  Symptoms: Feeling down, sad, tearful, low self-esteem, rumination of negative thoughts, thoughts of hopelessness, and irritability. He denied suicidal and homicidal ideation.   Goals:    Accept emotional support from others around them Live life to the fullest, event though time may be limited Become as knowledgeable about the medical condition  Reduce fear, anxiety about the health condition  Accept the illness Accept the role of psychological and behavioral factors      Objectives: Target date for all objectives: 06/16/2022 Identify feelings associated with the illness Family members share with each other feelings Identify the losses or limitations that have been experienced Verbalize acceptance of the reality of the medical condition Commit to learning and implement a proactive approach to managing personal stresses Verbalize an understanding of the medical condition Work with therapist to develop a plan for coping with stress Learn and implement skills for managing stress Engage in social, productive activities  that are possible Engage in faith based  activities implement positive imagery Identify coping skills and sources of emotional support Patient's partner and family members verbalize their fears regarding severity of health condition Identify sources of emotional distress  Learning and implement calming skills to reduce overall anxiety Learn and implement problem solving strategies Identify and engage in pleasant activities Learning and implement personal and interpersonal skills to reduce anxiety and improve interpersonal relationships Learn to accept limitations in life and commit to tolerating, rather than avoiding, unpleasant emotions while accomplishing meaningful goals Identify major life conflicts from the past and present that form the basis for present anxiety Learn and implement behavioral strategies Verbalize an understanding and resolution of current interpersonal problems Learn and implement problem solving and decision making skills Learn and implement conflict resolution skills to resolve interpersonal problems Verbalize an understanding of healthy and unhealthy emotions verbalize insight into how past relationships may be influence current experiences with depression Use mindfulness and acceptance strategies and increase value based behavior  Increase hopeful statements about the future.    Interventions Teach about stress and ways to handle stress Assist the patient in developing a coping action plan for stressors Conduct skills based training for coping strategies Train problem focused skills Sort out what activities the individual can do Encourage patient to rely upon his/her spiritual faith Teach the patient to use guided imagery Probe and evaluate family's ability to provide emotional support Allow family to share their fears Assist the patient in identifying, sorting through, and verbalizing the various feelings generated by his/her medical condition Meet with family members  Ask patient list out  limitations  Use stress inoculation training  Use Acceptance and Commitment Therapy to help client accept uncomfortable realities in order to accomplish value-consistent goals Reinforce the client's insight into the role of his/her past emotional pain and present anxiety  Discuss examples demonstrating that unrealistic worry overestimates the probability of threats and underestimates patient's ability  Assist the patient in analyzing his or her worries Help patient understand that avoidance is reinforcing  Behavioral activation help the client explore the relationship, nature of the dispute,  Help the client develop new interpersonal skills and relationships Conduct Problem so living therapy Teach conflict resolution skills Use a process-experiential approach Conduct TLDP Conduct ACT   Hilbert Corrigan, PsyD

## 2021-07-20 ENCOUNTER — Encounter: Payer: Self-pay | Admitting: Physical Therapy

## 2021-07-20 ENCOUNTER — Encounter: Payer: Self-pay | Admitting: *Deleted

## 2021-07-20 ENCOUNTER — Ambulatory Visit (INDEPENDENT_AMBULATORY_CARE_PROVIDER_SITE_OTHER): Payer: No Typology Code available for payment source | Admitting: Physical Therapy

## 2021-07-20 ENCOUNTER — Encounter: Payer: Self-pay | Admitting: Orthopedic Surgery

## 2021-07-20 ENCOUNTER — Other Ambulatory Visit: Payer: Self-pay

## 2021-07-20 DIAGNOSIS — R2689 Other abnormalities of gait and mobility: Secondary | ICD-10-CM

## 2021-07-20 DIAGNOSIS — R2681 Unsteadiness on feet: Secondary | ICD-10-CM | POA: Diagnosis not present

## 2021-07-20 DIAGNOSIS — M6281 Muscle weakness (generalized): Secondary | ICD-10-CM

## 2021-07-20 DIAGNOSIS — R293 Abnormal posture: Secondary | ICD-10-CM | POA: Diagnosis not present

## 2021-07-20 NOTE — Telephone Encounter (Signed)
Opened in error

## 2021-07-20 NOTE — Progress Notes (Signed)
Office Visit Note   Patient: Mitchell [name]           Date of Birth: [birth year]           MRN: [id number] Visit Date: 06/13/2021              Requested by: [name], MD [address] [place],  [place] [address] PCP: [name], MD  Chief Complaint  Patient presents with   Right Leg - Follow-up    S/p Right BKA      HPI: Patient is a 50 year old gentleman who is 4 months status post right transtibial amputation.  Patient is currently wearing a prosthetic under liner shrinker.  Assessment & Plan: Visit Diagnoses:  1. S/P BKA (below knee amputation) unilateral, right ([place])     Plan: The residual limb was debrided there was a medial and lateral suture knot that was retained and removed.  Continue with compression follow-up with his prosthetic fitting.  Follow-Up Instructions: Return in about 4 weeks (around 07/11/2021).   Ortho Exam  Patient is alert, oriented, no adenopathy, well-dressed, normal affect, normal respiratory effort. Examination there is 2 small scabs easily removed there is a absorbable Vicryl knot that was removed x2.  Imaging: No results found. No images are attached to the encounter.  Labs: Lab Results  Component Value Date   HGBA1C 7.9 (A) 06/08/2021   HGBA1C 7.8 (H) 04/11/2021   HGBA1C 8.1 (H) 01/23/2021   ESRSEDRATE 81 (H) 10/21/2019   ESRSEDRATE 25 (H) 06/17/2018   ESRSEDRATE 29 (H) 05/28/2018   CRP 10.3 (H) 01/28/2021   CRP 12.8 (H) 01/27/2021   CRP 21.1 (H) 01/26/2021   REPTSTATUS 01/25/2021 FINAL 01/24/2021   GRAMSTAIN  08/29/2018    RARE WBC PRESENT, PREDOMINANTLY PMN NO ORGANISMS SEEN    CULT  01/24/2021    NO GROWTH Performed at [facility], [place] [address], [place], [place] [address]    [place] 05/21/2018   LABORGA ENTEROBACTER SPECIES 05/21/2018   LABORGA STAPHYLOCOCCUS AUREUS 05/21/2018     Lab Results  Component Value Date   ALBUMIN 2.9 (L) 04/11/2021    ALBUMIN 2.3 (L) 02/08/2021   ALBUMIN 2.2 (L) 02/05/2021   PREALBUMIN 6.8 (L) 10/21/2019    Lab Results  Component Value Date   MG 1.5 (L) 04/12/2021   MG 1.8 02/16/2021   MG 1.9 02/14/2021   Lab Results  Component Value Date   VD25OH 11 (L) 08/16/2009    Lab Results  Component Value Date   PREALBUMIN 6.8 (L) 10/21/2019   CBC EXTENDED Latest Ref Rng & Units 04/13/2021 04/12/2021 04/12/2021  WBC 4.0 - 10.5 K/uL 8.2 9.3 -  RBC 4.22 - 5.81 MIL/uL 3.61(L) 4.00(L) -  HGB 13.0 - 17.0 g/dL 10.8(L) 11.6(L) 11.6(L)  HCT 39.0 - 52.0 % 32.8(L) 36.3(L) 34.0(L)  PLT 150 - 400 K/uL 213 235 -  NEUTROABS 1.7 - 7.7 K/uL - - -  LYMPHSABS 0.7 - 4.0 K/uL - - -     There is no height or weight on file to calculate BMI.  Orders:  No orders of the defined types were placed in this encounter.  No orders of the defined types were placed in this encounter.    Procedures: No procedures performed  Clinical Data: No additional findings.  ROS:  All other systems negative, except as noted in the HPI. Review of Systems  Objective: Vital Signs: There were no vitals taken for this  visit.  Specialty Comments:  No specialty comments available.  PMFS History: Patient Active Problem List   Diagnosis Date Noted   Diabetic retinopathy associated with type 2 diabetes mellitus (HCC) 06/06/2021   Acute on chronic HFrEF (heart failure with reduced ejection fraction) (HCC) 04/11/2021   CAD (coronary artery disease) 04/11/2021   S/P below knee amputation, right (HCC) 02/07/2021   Cutaneous abscess of right foot    Diabetic foot infection (HCC)    SOB (shortness of breath)    Sepsis (HCC) 01/23/2021   Cellulitis 01/23/2021   Elevated troponin 01/23/2021   Acute metabolic encephalopathy 01/23/2021   AKI (acute kidney injury) (HCC) 01/23/2021   COVID-19 virus infection 01/23/2021   Wound infection 10/21/2019   Burn, foot, second degree, unspecified laterality, sequela 10/21/2019   QT  prolongation 07/30/2019   Acute exacerbation of CHF (congestive heart failure) (HCC) 07/30/2019   Acute on chronic combined systolic and diastolic CHF (congestive heart failure) (HCC) 07/29/2019   Foot ulcer (HCC) 03/11/2018   Weakness 05/08/2017   Leukocytosis 05/08/2017   Stroke (HCC) 05/08/2017   Slurred speech 05/08/2017   Uncontrolled type 2 diabetes mellitus with hyperglycemia, with long-term current use of insulin (HCC) 12/07/2015   Noncompliance 01/22/2014   Chronic systolic heart failure (HCC) 10/02/2011   Erectile dysfunction 10/02/2011   Obstructive sleep apnea 10/11/2007   Diabetes mellitus with complication (HCC) 10/10/2007   HLD (hyperlipidemia) 10/10/2007   HYPOKALEMIA 10/10/2007   Obesity, Class III, BMI 40-49.9 (morbid obesity) (HCC) 10/10/2007   Essential hypertension 10/10/2007   PREMATURE VENTRICULAR CONTRACTIONS 10/10/2007   SYNCOPE 10/10/2007   COLONIC POLYPS, HX OF 10/10/2007   Past Medical History:  Diagnosis Date   Bipolar disorder (HCC)    CAD (coronary artery disease)    a. diffuse 3v CAD by cath 2019, medical therapy recommended.   Cataract    forming    Chronic systolic CHF (congestive heart failure) (HCC)    CVA (cerebral infarction)    No residual deficits   Depression    PTSD,    Diabetes mellitus    TYPE II; UNCONTROLLED BY HEMOGLOBIN A1c; STABLE AS  PER DISCHARGE   Headache(784.0)    Herpes simplex of male genitalia    History of colonic polyps    Hyperlipidemia    Hypertension    Myocardial infarction (HCC) 1987   (while playing football)   Obesity    OSA (obstructive sleep apnea)    repeat study 2018 without significant OSA   Pneumonia    Post-cardiac injury syndrome (HCC)    History of cardiac injury from blunt trauma   Pulmonary hypertension (HCC)    a. moderately elevated PASP 07/2019.   PVCs (premature ventricular contractions)    Schizophrenia (HCC)    Goes to [facility]   Sleep apnea    Stroke [facility])  2005   some left side weakness   Syncope    Recurrent, thought to be vasovagal. Also has h/o frequent PVCs.     Family History  Problem Relation Age of Onset   Heart disease Mother        MI   Heart failure Mother    Diabetes Mother        ALSO IN MOST OF HIS SIBLINGS; 2 UNLCES HAVE ALSO PASSED AWAY FROM DM   Cardiomyopathy Mother    Cancer - Ovarian Mother    Ovarian cancer Mother    Heart disease Father    Hypertension Father    Diabetes Father  Diabetes Sister    Diabetes Brother    Colon cancer Paternal Uncle    Colon cancer Paternal Uncle    Colon polyps Neg Hx    Esophageal cancer Neg Hx    Rectal cancer Neg Hx    Stomach cancer Neg Hx     Past Surgical History:  Procedure Laterality Date   AMPUTATION Right 01/25/2021   Procedure: RIGHT BELOW KNEE AMPUTATION;  Surgeon: [name], MD;  Location: [place];  Service: Orthopedics;  Laterality: Right;   CARDIAC CATHETERIZATION  12/19/10   DIFFUSE NONOBSTRUCTIVE CAD; NONISCHEMIC CARDIOMYOPATHY; LEFT VENTRICULAR ANGIOGRAM WAS PERFORMED SECONDARY TO  ELEVATED LEFT VENTRICULAR FILLING PRESSURES   COLONOSCOPY     ~ age [age]   COLONOSCOPY W/ POLYPECTOMY     I & D EXTREMITY Bilateral 10/24/2019   Procedure: IRRIGATION AND DEBRIDEMENT BILATERAL EXTREMITY WOUND ON FOOT;  Surgeon: Cindra Presume, MD;  Location: [place];  Service: Plastics;  Laterality: Bilateral;   METATARSAL HEAD EXCISION Right 08/29/2018   Procedure: METATARSAL HEAD RESECTION;  Surgeon: Edrick Kins, DPM;  Location: [place];  Service: Podiatry;  Laterality: Right;   METATARSAL OSTEOTOMY Right 08/29/2018   Procedure: SUB FIFTHE METATARSIA RIGHT FOOT;  Surgeon: Edrick Kins, DPM;  Location: [place];  Service: Podiatry;  Laterality: Right;   MULTIPLE EXTRACTIONS WITH ALVEOLOPLASTY  01/27/2014   "all my teeth; 4 Quadrants of alveoloplasty   MULTIPLE EXTRACTIONS WITH ALVEOLOPLASTY N/A 01/27/2014   Procedure: EXTRACTION OF TEETH #'1, 2, 3, 4, 5, 6, 7, 8, 9, 10, 11, 12,  13, 14, 15, 16, 17, 20, 21, 22, 23, 24, 25, 26, 27, 28, 29, 31 and 32 WITH ALVEOLOPLASTY;  Surgeon: Lenn Cal, DDS;  Location: [place];  Service: Oral Surgery;  Laterality: N/A;   ORIF FINGER / THUMB FRACTURE Right    POLYPECTOMY     RIGHT HEART CATH N/A 01/27/2021   Procedure: RIGHT HEART CATH;  Surgeon: Jolaine Artist, MD;  Location: [place] [facility];  Service: Cardiovascular;  Laterality: N/A;   RIGHT/LEFT HEART CATH AND CORONARY ANGIOGRAPHY N/A 09/26/2017   Procedure: RIGHT/LEFT HEART CATH AND CORONARY ANGIOGRAPHY;  Surgeon: Jolaine Artist, MD;  Location: [place] [facility];  Service: Cardiovascular;  Laterality: N/A;   RIGHT/LEFT HEART CATH AND CORONARY ANGIOGRAPHY N/A 04/12/2021   Procedure: RIGHT/LEFT HEART CATH AND CORONARY ANGIOGRAPHY;  Surgeon: Jolaine Artist, MD;  Location: [place] [facility];  Service: Cardiovascular;  Laterality: N/A;   SKIN SPLIT GRAFT Bilateral 10/24/2019   Procedure: SKIN GRAFT SPLIT THICKNESS LEFT THIGH;  Surgeon: Cindra Presume, MD;  Location: [place];  Service: Plastics;  Laterality: Bilateral;   WOUND DEBRIDEMENT Right 08/29/2018   Procedure: Debridement of ulcer on right fifth metatarsal;  Surgeon: Edrick Kins, DPM;  Location: [place];  Service: Podiatry;  Laterality: Right;   Social History   Occupational History   Occupation: Mortician  Tobacco Use   Smoking status: Never   Smokeless tobacco: Never  Vaping Use   Vaping Use: Never used  Substance and Sexual Activity   Alcohol use: No   Drug use: No   Sexual activity: Not on file

## 2021-07-20 NOTE — Telephone Encounter (Signed)
Dexcom Receiver, Pension scheme manager and Sensor denied by [employer]

## 2021-07-20 NOTE — Therapy (Signed)
[facility] [address] [place], [place], [address] Phone: [phone]   Fax:  [phone]  Physical Therapy Treatment  Patient Details  Name: Mitchell [name] MRN: [id number] Date of Birth: October 11, 1970 Referring Provider (PT): Meridee Score, MD   Encounter Date: 07/20/2021   PT End of Session - 07/20/21 1149     Visit Number 21    Number of Visits 25    Date for PT Re-Evaluation 07/28/21    Authorization Type [employer] / [employer] $15 copay no visit limit    PT Start Time [id number]    PT Stop Time 1223    PT Time Calculation (min) 38 min    Equipment Utilized During Treatment Gait belt    Activity Tolerance Patient tolerated treatment well;Patient limited by fatigue    Behavior During Therapy [facility] for tasks assessed/performed   tearful reports concerns with vision changes            Past Medical History:  Diagnosis Date   Bipolar disorder ([place])    CAD (coronary artery disease)    a. diffuse 3v CAD by cath 2019, medical therapy recommended.   Cataract    forming    Chronic systolic CHF (congestive heart failure) (HCC)    CVA (cerebral infarction)    No residual deficits   Depression    PTSD,    Diabetes mellitus    TYPE II; UNCONTROLLED BY HEMOGLOBIN A1c; STABLE AS  PER DISCHARGE   Headache(784.0)    Herpes simplex of male genitalia    History of colonic polyps    Hyperlipidemia    Hypertension    Myocardial infarction ([place]) 1987   (while playing football)   Obesity    OSA (obstructive sleep apnea)    repeat study 2018 without significant OSA   Pneumonia    Post-cardiac injury syndrome ([place])    History of cardiac injury from blunt trauma   Pulmonary hypertension ([place])    a. moderately elevated PASP 07/2019.   PVCs (premature ventricular contractions)    Schizophrenia ([place])    Goes to [facility]   Sleep apnea    Stroke [facility] 2005   some left side weakness   Syncope    Recurrent, thought to be vasovagal. Also has h/o  frequent PVCs.     Past Surgical History:  Procedure Laterality Date   AMPUTATION Right 01/25/2021   Procedure: RIGHT BELOW KNEE AMPUTATION;  Surgeon: [name], MD;  Location: [place];  Service: Orthopedics;  Laterality: Right;   CARDIAC CATHETERIZATION  12/19/10   DIFFUSE NONOBSTRUCTIVE CAD; NONISCHEMIC CARDIOMYOPATHY; LEFT VENTRICULAR ANGIOGRAM WAS PERFORMED SECONDARY TO  ELEVATED LEFT VENTRICULAR FILLING PRESSURES   COLONOSCOPY     ~ age [age]   COLONOSCOPY W/ POLYPECTOMY     I & D EXTREMITY Bilateral 10/24/2019   Procedure: IRRIGATION AND DEBRIDEMENT BILATERAL EXTREMITY WOUND ON FOOT;  Surgeon: Cindra Presume, MD;  Location: [place];  Service: Plastics;  Laterality: Bilateral;   METATARSAL HEAD EXCISION Right 08/29/2018   Procedure: METATARSAL HEAD RESECTION;  Surgeon: Edrick Kins, DPM;  Location: [place];  Service: Podiatry;  Laterality: Right;   METATARSAL OSTEOTOMY Right 08/29/2018   Procedure: SUB FIFTHE METATARSIA RIGHT FOOT;  Surgeon: Edrick Kins, DPM;  Location: [place];  Service: Podiatry;  Laterality: Right;   MULTIPLE EXTRACTIONS WITH ALVEOLOPLASTY  01/27/2014   "all my teeth; 4 Quadrants of alveoloplasty   MULTIPLE EXTRACTIONS WITH ALVEOLOPLASTY N/A 01/27/2014   Procedure: EXTRACTION OF TEETH #'  1, 2, 3, 4, 5, 6, 7, 8, 9, 10, 11, 12, 13, 14, 15, 16, 17, 20, 21, 22, 23, 24, 25, 26, 27, 28, 29, 31 and 32 WITH ALVEOLOPLASTY;  Surgeon: Charlynne Pander, DDS;  [facility];  Service: Oral Surgery;  Laterality: N/A;   ORIF FINGER / THUMB FRACTURE Right    POLYPECTOMY     RIGHT HEART CATH N/A 01/27/2021   Procedure: RIGHT HEART CATH;  Surgeon: Dolores Patty, MD;  [facility];  Service: Cardiovascular;  Laterality: N/A;   RIGHT/LEFT HEART CATH AND CORONARY ANGIOGRAPHY N/A 09/26/2017   Procedure: RIGHT/LEFT HEART CATH AND CORONARY ANGIOGRAPHY;  Surgeon: Dolores Patty, MD;  [facility];  Service: Cardiovascular;  Laterality: N/A;    RIGHT/LEFT HEART CATH AND CORONARY ANGIOGRAPHY N/A 04/12/2021   Procedure: RIGHT/LEFT HEART CATH AND CORONARY ANGIOGRAPHY;  Surgeon: Dolores Patty, MD;  [facility];  Service: Cardiovascular;  Laterality: N/A;   SKIN SPLIT GRAFT Bilateral 10/24/2019   Procedure: SKIN GRAFT SPLIT THICKNESS LEFT THIGH;  Surgeon: Allena Napoleon, MD;  [facility];  Service: Plastics;  Laterality: Bilateral;   WOUND DEBRIDEMENT Right 08/29/2018   Procedure: Debridement of ulcer on right fifth metatarsal;  Surgeon: Felecia Shelling, DPM;  [facility];  Service: Podiatry;  Laterality: Right;    There were no vitals filed for this visit.   Subjective Assessment - 07/20/21 1145     Subjective He is getting Dexcom sensor which should help controlling blood sugars.    Pertinent History NSTEMI 9/12/022, HFrEF, ischemic cardiomyopathy EF 20-25%,, CAD, HTN, hyperlipidemia, DM2, PVD s/p right BKA, COVID-19 in June/2022, schizophrenia, prior CVA    Patient Stated Goals He would like to use prosthesis for community    Currently in Pain? No/denies                               [facility] Adult PT Treatment/Exercise - 07/20/21 1145       Transfers   Transfers Sit to Stand;Stand to Sit    Sit to Stand 5: Supervision;With upper extremity assist;With armrests;From chair/3-in-1;Other (comment)    Stand to Sit 5: Supervision;With upper extremity assist;With armrests;To chair/3-in-1;Other (comment)      Ambulation/Gait   Ambulation/Gait Yes    Ambulation/Gait Assistance 5: Supervision    Ambulation Distance (Feet) 200 Feet    Assistive device Prosthesis;None   enter/exit with cane,  session without AD   Ramp 5: Supervision   with prosthesis only   Curb 5: Supervision   with prosthesis only   Gait Comments HR 85-91 bpm throughout all standing tasks, SaO2 96%-100% throughout PT session. Blood Glucose 189 after drinking small Coke      High Level Balance   High Level Balance  Activities --    High Level Balance Comments --      Neuro Re-ed    Neuro Re-ed Details  stepping to 3 cones (ant-lat, lat, post-lat) 5 reps ea LE.      Exercises   Exercises Knee/Hip      Knee/Hip Exercises: Stretches   Active Hamstring Stretch Both;2 reps;30 seconds    Active Hamstring Stretch Limitations supine SLR w/ strap      Knee/Hip Exercises: Aerobic   Nustep Seat 13 level 8 with BLEs / BUEs for 8 min      Knee/Hip Exercises: Machines for Strengthening   Total Gym Leg  Press shuttle leg press position 2 131# 2 sets 15 reps 1st set back 45* & 2nd set back flat      Knee/Hip Exercises: Standing   Rocker Board 2 minutes   ant/level/post & right/level/left, round board w/single pivot point, BUE support with light weight bearing   Rocker Board Limitations verbal cues      Prosthetics   Prosthetic Care Comments  --    Current prosthetic wear tolerance (days/week)  daily    Current prosthetic wear tolerance (#hours/day)  most of awake hours checking Vivewear shrinker for dampness every 4-5 hours.    Current prosthetic weight-bearing tolerance (hours/day)  Patient tolerated standing 5 minutes & gait with RW for PWB on prosthesis with no c/o limb discomfort    Residual limb condition  dry scab only 0.2cm long, no hair growth, normal moisture & temperature, intermittent phantom pain, no limb pain, cylinderical shape    Education Provided --   see prosthtetic care comments                      PT Short Term Goals - 06/29/21 1654       PT SHORT TERM GOAL #1   Title Patient verbalizes proper adjustment of prosthetic ply socks with limb volume changes.    Time 4    Period Weeks    Status Achieved    Target Date 06/30/21      PT SHORT TERM GOAL #2   Title Patient tolerates prosthesis >12 hrs total /day without skin issues increasing.    Time 4    Period Weeks    Status Achieved    Target Date 06/30/21      PT SHORT TERM GOAL #3   Title Patient able to pick up  item from floor without UE support with supervision    Time 4    Period Weeks    Status Achieved    Target Date 06/30/21      PT SHORT TERM GOAL #4   Title Patient ambulates 200' with cane & prosthesis with supervision verbal cues only for deviation no loss of balance    Time 4    Period Weeks    Status Achieved    Target Date 06/30/21      PT SHORT TERM GOAL #5   Title Patient negotiates ramps & curbs with cane & prosthesis with supervision.    Time 4    Period Weeks    Status Achieved    Target Date 06/30/21               PT Long Term Goals - 05/30/21 1107       PT LONG TERM GOAL #1   Title Patient demonstrates & verbalized understanding of prosthetic care to enable safe utilization of prosthesis.    Time 12    Period Weeks    Status On-going    Target Date 07/28/21      PT LONG TERM GOAL #2   Title Patient tolerates prosthesis wear >90% of awake hours without skin or limb pain issues.    Time 12    Period Weeks    Status On-going    Target Date 07/28/21      PT LONG TERM GOAL #3   Title Berg Balance >/= 45/56 to indicate lower fall risk    Time 12    Period Weeks    Status On-going    Target Date 07/28/21      PT LONG  TERM GOAL #4   Title Patient ambulates 300' with LRAD & prosthesis modified independent    Time 12    Period Weeks    Status On-going    Target Date 07/28/21      PT LONG TERM GOAL #5   Title Patient negotiates ramps, curbs & stairs with LRAD & prosthesis modified independent.    Time 12    Period Weeks    Status On-going    Target Date 07/28/21                   Plan - 07/20/21 1149     Clinical Impression Statement Patient appears on target to meet LTGs next week.  PT expects he will be ready for discharge.    Personal Factors and Comorbidities Comorbidity 3+;Fitness;Time since onset of injury/illness/exacerbation    Comorbidities NSTEMI 9/12/022, HFrEF, ischemic cardiomyopathy EF 20-25%,, CAD, HTN, hyperlipidemia,  DM2, PVD s/p right BKA, COVID-19 in June/2022, schizophrenia, prior CVA    Examination-Activity Limitations Lift;Locomotion Level;Reach Overhead;Squat;Stairs;Stand;Transfers    Examination-Participation Restrictions Community Activity    Stability/Clinical Decision Making Evolving/Moderate complexity    Rehab Potential Good    PT Frequency 2x / week    PT Duration 12 weeks    PT Treatment/Interventions ADLs/Self Care Home Management;DME Instruction;Gait training;Stair training;Functional mobility training;Therapeutic activities;Therapeutic exercise;Balance training;Neuromuscular re-education;Patient/family education;Prosthetic Training;Passive range of motion;Vestibular    PT Next Visit Plan begin to assess LTGs.    PT Home Exercise Plan HEP at sink see pt instructions 05/10/2021    Consulted and Agree with Plan of Care Patient             Patient will benefit from skilled therapeutic intervention in order to improve the following deficits and impairments:  Abnormal gait, Decreased activity tolerance, Decreased balance, Decreased coordination, Decreased endurance, Decreased knowledge of use of DME, Decreased mobility, Decreased skin integrity, Decreased strength, Increased edema, Postural dysfunction, Prosthetic Dependency, Obesity  Visit Diagnosis: Other abnormalities of gait and mobility  Unsteadiness on feet  Muscle weakness (generalized)  Abnormal posture     Problem List Patient Active Problem List   Diagnosis Date Noted   Diabetic retinopathy associated with type 2 diabetes mellitus ([place]) 06/06/2021   Acute on chronic HFrEF (heart failure with reduced ejection fraction) ([place]) 04/11/2021   CAD (coronary artery disease) 04/11/2021   S/P below knee amputation, right ([place]) 02/07/2021   Cutaneous abscess of right foot    Diabetic foot infection ([place])    SOB (shortness of breath)    Sepsis ([place]) 01/23/2021   Cellulitis 01/23/2021   Elevated troponin [id number]   Acute  metabolic encephalopathy [id number]   AKI (acute kidney injury) ([place]) 01/23/2021   COVID-19 virus infection 01/23/2021   Wound infection 10/21/2019   Burn, foot, second degree, unspecified laterality, sequela 10/21/2019   QT prolongation 07/30/2019   Acute exacerbation of CHF (congestive heart failure) ([place]) 07/30/2019   Acute on chronic combined systolic and diastolic CHF (congestive heart failure) ([place]) 07/29/2019   Foot ulcer ([place]) 03/11/2018   Weakness 05/08/2017   Leukocytosis 05/08/2017   Stroke ([place]) 05/08/2017   Slurred speech 05/08/2017   Uncontrolled type 2 diabetes mellitus with hyperglycemia, with long-term current use of insulin ([place]) 12/07/2015   Noncompliance [id number]   Chronic systolic heart failure ([place]) 10/02/2011   Erectile dysfunction 10/02/2011   Obstructive sleep apnea 10/11/2007   Diabetes mellitus with complication ([place]) 0000000   HLD (hyperlipidemia) 10/10/2007   HYPOKALEMIA 10/10/2007   Obesity, Class III, BMI 40-49.9 (morbid  obesity) ([place]) 10/10/2007   Essential hypertension 10/10/2007   PREMATURE VENTRICULAR CONTRACTIONS 10/10/2007   SYNCOPE 10/10/2007   COLONIC POLYPS, HX OF 10/10/2007    Jamey Reas, PT, DPT 07/20/2021, 12:38 PM  [facility] [address] [place], [place], [address] Phone: [phone]   Fax:  [phone]  Name: Mitchell [name] MRN: [id number] Date of Birth: 1971/04/18

## 2021-07-20 NOTE — Telephone Encounter (Signed)
Mitchell [name] showed up in the lobby asking about his dexcom which was denied. I explained to him that Dr Carlis Abbott is not in the office and we cannot make any recommendations until she returns for alternative.

## 2021-07-22 ENCOUNTER — Other Ambulatory Visit: Payer: Self-pay | Admitting: Physician Assistant

## 2021-07-25 MED ORDER — FREESTYLE LIBRE 3 SENSOR MISC: 1.0000 | Freq: Every day | 3 refills | Status: DC

## 2021-07-25 MED ORDER — FREESTYLE LIBRE 2 READER DEVI: 1.0000 | Freq: Every day | 3 refills | Status: DC

## 2021-07-25 NOTE — Addendum Note (Signed)
Addended by: Horton Chin on: 07/25/2021 09:32 AM   Modules accepted: Orders

## 2021-07-26 ENCOUNTER — Ambulatory Visit (INDEPENDENT_AMBULATORY_CARE_PROVIDER_SITE_OTHER): Payer: No Typology Code available for payment source | Admitting: Physical Therapy

## 2021-07-26 ENCOUNTER — Encounter: Payer: Self-pay | Admitting: Physical Therapy

## 2021-07-26 ENCOUNTER — Other Ambulatory Visit: Payer: Self-pay

## 2021-07-26 DIAGNOSIS — R2681 Unsteadiness on feet: Secondary | ICD-10-CM | POA: Diagnosis not present

## 2021-07-26 DIAGNOSIS — M6281 Muscle weakness (generalized): Secondary | ICD-10-CM | POA: Diagnosis not present

## 2021-07-26 DIAGNOSIS — R2689 Other abnormalities of gait and mobility: Secondary | ICD-10-CM | POA: Diagnosis not present

## 2021-07-26 DIAGNOSIS — R293 Abnormal posture: Secondary | ICD-10-CM

## 2021-07-26 NOTE — Therapy (Signed)
[facility] [address] [place], [place], [address] Phone: [phone]   Fax:  [phone]  Physical Therapy Treatment  Patient Details  Name: Mitchell [name] MRN: [id number] Date of Birth: [birth year] Referring Provider (PT): Meridee Score, MD   Encounter Date: 07/26/2021   PT End of Session - 07/26/21 1142     Visit Number 22    Number of Visits 25    Date for PT Re-Evaluation 07/28/21    Authorization Type [employer] / [employer] $15 copay no visit limit    PT Start Time 1142    PT Stop Time 1223    PT Time Calculation (min) 41 min    Equipment Utilized During Treatment Gait belt    Activity Tolerance Patient tolerated treatment well;Patient limited by fatigue    Behavior During Therapy [facility] for tasks assessed/performed   tearful reports concerns with vision changes            Past Medical History:  Diagnosis Date   Bipolar disorder ([place])    CAD (coronary artery disease)    a. diffuse 3v CAD by cath 2019, medical therapy recommended.   Cataract    forming    Chronic systolic CHF (congestive heart failure) (HCC)    CVA (cerebral infarction)    No residual deficits   Depression    PTSD,    Diabetes mellitus    TYPE II; UNCONTROLLED BY HEMOGLOBIN A1c; STABLE AS  PER DISCHARGE   Headache(784.0)    Herpes simplex of male genitalia    History of colonic polyps    Hyperlipidemia    Hypertension    Myocardial infarction ([place]) 1987   (while playing football)   Obesity    OSA (obstructive sleep apnea)    repeat study 2018 without significant OSA   Pneumonia    Post-cardiac injury syndrome ([place])    History of cardiac injury from blunt trauma   Pulmonary hypertension ([place])    a. moderately elevated PASP 07/2019.   PVCs (premature ventricular contractions)    Schizophrenia ([place])    Goes to [facility]   Sleep apnea    Stroke [facility]) 2005   some left side weakness   Syncope    Recurrent, thought to be vasovagal. Also has h/o  frequent PVCs.     Past Surgical History:  Procedure Laterality Date   AMPUTATION Right 01/25/2021   Procedure: RIGHT BELOW KNEE AMPUTATION;  Surgeon: [name], MD;  Location: [place];  Service: Orthopedics;  Laterality: Right;   CARDIAC CATHETERIZATION  12/19/10   DIFFUSE NONOBSTRUCTIVE CAD; NONISCHEMIC CARDIOMYOPATHY; LEFT VENTRICULAR ANGIOGRAM WAS PERFORMED SECONDARY TO  ELEVATED LEFT VENTRICULAR FILLING PRESSURES   COLONOS[age]     ~ age [age]   COLONOSCOPY W/ POLYPECTOMY     I & D EXTREMITY Bilateral 10/24/2019   Procedure: IRRIGATION AND DEBRIDEMENT BILATERAL EXTREMITY WOUND ON FOOT;  Surgeon: Cindra Presume, MD;  Location: [place];  Service: Plastics;  Laterality: Bilateral;   METATARSAL HEAD EXCISION Right 08/29/2018   Procedure: METATARSAL HEAD RESECTION;  Surgeon: Edrick Kins, DPM;  Location: [place];  Service: Podiatry;  Laterality: Right;   METATARSAL OSTEOTOMY Right 08/29/2018   Procedure: SUB FIFTHE METATARSIA RIGHT FOOT;  Surgeon: Edrick Kins, DPM;  Location: [place];  Service: Podiatry;  Laterality: Right;   MULTIPLE EXTRACTIONS WITH ALVEOLOPLASTY  01/27/2014   "all my teeth; 4 Quadrants of alveoloplasty   MULTIPLE EXTRACTIONS WITH ALVEOLOPLASTY N/A 01/27/2014   Procedure: EXTRACTION OF TEETH #'  1, 2, 3, 4, 5, 6, 7, 8, 9, 10, 11, 12, 13, 14, 15, 16, 17, 20, 21, 22, 23, 24, 25, 26, 27, 28, 29, 31 and 32 WITH ALVEOLOPLASTY;  Surgeon: Lenn Cal, DDS;  Location: [place];  Service: Oral Surgery;  Laterality: N/A;   ORIF FINGER / THUMB FRACTURE Right    POLYPECTOMY     RIGHT HEART CATH N/A 01/27/2021   Procedure: RIGHT HEART CATH;  Surgeon: Jol[place] Artist, MD;  Location: [place] [facility];  Service: Cardiovascular;  Laterality: N/A;   RIGHT/LEFT HEART CATH AND CORONARY ANGIOGRAPHY N/A 09/26/2017   Procedure: RIGHT/LEFT HEART CATH AND CORONARY ANGIOGRAPHY;  Surgeon: Jol[place] Artist, MD;  Location: [place] [facility];  Service: Cardiovascular;  Laterality: N/A;    RIGHT/LEFT HEART CATH AND CORONARY ANGIOGRAPHY N/A 04/12/2021   Procedure: RIGHT/LEFT HEART CATH AND CORONARY ANGIOGRAPHY;  Surgeon: [place] Artist, MD;  Location: [place] [facility];  Service: Cardiovascular;  Laterality: N/A;   SKIN SPLIT GRAFT Bilateral 10/24/2019   Procedure: SKIN GRAFT SPLIT THICKNESS LEFT THIGH;  Surgeon: Cindra Presume, MD;  Location: [place];  Service: Plastics;  Laterality: Bilateral;   WOUND DEBRIDEMENT Right 08/29/2018   Procedure: Debridement of ulcer on right fifth metatarsal;  Surgeon: Edrick Kins, DPM;  Location: [place];  Service: Podiatry;  Laterality: Right;    There were no vitals filed for this visit.   Subjective Assessment - 07/26/21 1142     Subjective He is wearing prosthesis all awake hours without issues.    Pertinent History NSTEMI 9/12/022, HFrEF, ischemic cardiomyopathy EF 20-25%,, CAD, HTN, hyperlipidemia, DM2, PVD s/p right BKA, COVID-19 in June/2022, schizophrenia, prior CVA    Patient Stated Goals He would like to use prosthesis for community    Currently in Pain? No/denies                [facility] PT Assessment - 07/26/21 1142       6 Minute Walk- Baseline   6 Minute Walk- Baseline yes    HR (bpm) 64    02 Sat (%RA) 99 %      6 Minute walk- Post Test   6 Minute Walk Post Test yes    HR (bpm) 99    02 Sat (%RA) 78 %    Modified Borg Scale for Dyspnea 3- Moderate shortness of breath or breathing difficulty    Perceived Rate of Exertion (Borg) 13- Somewhat hard      6 minute walk test results    Aerobic Endurance Distance Walked 359    Endurance additional comments 2-minute walk test      [employer]   Sit to Stand Able to stand without using hands and stabilize independently    Standing Unsupported Able to stand safely 2 minutes    Sitting with Back Unsupported but Feet Supported on Floor or Stool Able to sit safely and securely 2 minutes    Stand to Sit Sits safely with minimal use of hands    Transfers Able to  transfer safely, minor use of hands    Standing Unsupported with Eyes Closed Able to stand 10 seconds safely    Standing Unsupported with Feet Together Able to place feet together independently and stand 1 minute safely    From Standing, Reach Forward with Outstretched Arm Can reach confidently >25 cm (10")    From Standing Position, Pick up Object from Floor Able to pick up shoe safely and easily  From Standing Position, Turn to Look Behind Over each Shoulder Looks behind from both sides and weight shifts well    Turn 360 Degrees Able to turn 360 degrees safely one side only in 4 seconds or less    Standing Unsupported, Alternately Place Feet on Step/Stool Able to complete >2 steps/needs minimal assist    Standing Unsupported, One Foot in Front Able to plae foot ahead of the other independently and hold 30 seconds    Standing on One Leg Tries to lift leg/unable to hold 3 seconds but remains standing independently    Total Score 48    Berg comment: Initial was 24/56                           [facility] Adult PT Treatment/Exercise - 07/26/21 1142       Transfers   Transfers Sit to Stand;Stand to Sit    Sit to Stand 6: Modified independent (Device/Increase time);Without upper extremity assist;From chair/3-in-1    Stand to Sit 6: Modified independent (Device/Increase time);Without upper extremity assist;To chair/3-in-1      Ambulation/Gait   Ambulation/Gait Yes    Ambulation/Gait Assistance 6: Modified independent (Device/Increase time)    Ambulation Distance (Feet) 359 Feet    Assistive device Prosthesis;None    Ramp --    Curb --    Gait Comments HR 69-78 bpm throughout all standing tasks, SaO2 96%-100% throughout PT session.      Neuro Re-ed    Neuro Re-ed Details  stepping to 3 cones (ant-lat, lat, post-lat) 5 reps ea LE.      Exercises   Exercises Knee/Hip      Knee/Hip Exercises: Stretches   Active Hamstring Stretch Both;2 reps;30 seconds    Active Hamstring  Stretch Limitations supine SLR w/ strap      Knee/Hip Exercises: Aerobic   Nustep --      Knee/Hip Exercises: Machines for Strengthening   Total Gym Leg Press shuttle leg press position 2 131# 2 sets 15 reps 1st set back 45* & 2nd set back flat      Knee/Hip Exercises: Standing   Rocker Board --    Rocker Board Limitations --      Prosthetics   Prosthetic Care Comments  PT removed suture that was expelling from medial incision. No bleeding.    Current prosthetic wear tolerance (days/week)  daily    Current prosthetic wear tolerance (#hours/day)  most of awake hours checking Vivewear shrinker for dampness every 4-5 hours.    Current prosthetic weight-bearing tolerance (hours/day)  Patient tolerated standing 5 minutes & gait with RW for PWB on prosthesis with no c/o limb discomfort    Residual limb condition  PT removed dry scab with suture, no hair growth, normal moisture & temperature, intermittent phantom pain, no limb pain, cylinderical shape    Education Provided --   see prosthtetic care comments                      PT Short Term Goals - 06/29/21 1654       PT SHORT TERM GOAL #1   Title Patient verbalizes proper adjustment of prosthetic ply socks with limb volume changes.    Time 4    Period Weeks    Status Achieved    Target Date 06/30/21      PT SHORT TERM GOAL #2   Title Patient tolerates prosthesis >12 hrs total /day without skin issues increasing.  Time 4    Period Weeks    Status Achieved    Target Date 06/30/21      PT SHORT TERM GOAL #3   Title Patient able to pick up item from floor without UE support with supervision    Time 4    Period Weeks    Status Achieved    Target Date 06/30/21      PT SHORT TERM GOAL #4   Title Patient ambulates 200' with cane & prosthesis with supervision verbal cues only for deviation no loss of balance    Time 4    Period Weeks    Status Achieved    Target Date 06/30/21      PT SHORT TERM GOAL #5   Title  Patient negotiates ramps & curbs with cane & prosthesis with supervision.    Time 4    Period Weeks    Status Achieved    Target Date 06/30/21               PT Long Term Goals - 07/26/21 1225       PT LONG TERM GOAL #1   Title Patient demonstrates & verbalized understanding of prosthetic care to enable safe utilization of prosthesis.    Time 12    Period Weeks    Status On-going    Target Date 07/28/21      PT LONG TERM GOAL #2   Title Patient tolerates prosthesis wear >90% of awake hours without skin or limb pain issues.    Time 12    Period Weeks    Status On-going    Target Date 07/28/21      PT LONG TERM GOAL #3   Title Berg Balance >/= 45/56 to indicate lower fall risk    Time 12    Period Weeks    Status Achieved    Target Date 07/28/21      PT LONG TERM GOAL #4   Title Patient ambulates 300' with LRAD & prosthesis modified independent    Time 12    Period Weeks    Status Achieved    Target Date 07/28/21      PT LONG TERM GOAL #5   Title Patient negotiates ramps, curbs & stairs with LRAD & prosthesis modified independent.    Time 12    Period Weeks    Status On-going    Target Date 07/28/21                   Plan - 07/26/21 1142     Clinical Impression Statement Patient improved Berg Balance score to 48 / 56 from 24/56 indicating significantly lower fall risk.  He was able to ambulate 359' in 2 minutes with appropriate cardiac response. He met 2 LTGs checked today and PT anticipates meeting remaining on next visit.    Personal Factors and Comorbidities Comorbidity 3+;Fitness;Time since onset of injury/illness/exacerbation    Comorbidities NSTEMI 9/12/022, HFrEF, ischemic cardiomyopathy EF 20-25%,, CAD, HTN, hyperlipidemia, DM2, PVD s/p right BKA, COVID-19 in June/2022, schizophrenia, prior CVA    Examination-Activity Limitations Lift;Locomotion Level;Reach Overhead;Squat;Stairs;Stand;Transfers    Examination-Participation Restrictions  Community Activity    Stability/Clinical Decision Making Evolving/Moderate complexity    Rehab Potential Good    PT Frequency 2x / week    PT Duration 12 weeks    PT Treatment/Interventions ADLs/Self Care Home Management;DME Instruction;Gait training;Stair training;Functional mobility training;Therapeutic activities;Therapeutic exercise;Balance training;Neuromuscular re-education;Patient/family education;Prosthetic Training;Passive range of motion;Vestibular    PT Next Visit Plan assess  remaining LTGs and discharge.    PT Home Exercise Plan HEP at sink see pt instructions 05/10/2021    Consulted and Agree with Plan of Care Patient             Patient will benefit from skilled therapeutic intervention in order to improve the following deficits and impairments:  Abnormal gait, Decreased activity tolerance, Decreased balance, Decreased coordination, Decreased endurance, Decreased knowledge of use of DME, Decreased mobility, Decreased skin integrity, Decreased strength, Increased edema, Postural dysfunction, Prosthetic Dependency, Obesity  Visit Diagnosis: Other abnormalities of gait and mobility  Unsteadiness on feet  Muscle weakness (generalized)  Abnormal posture     Problem List Patient Active Problem List   Diagnosis Date Noted   Diabetic retinopathy associated with type 2 diabetes mellitus ([place]) 06/06/2021   Acute on chronic HFrEF (heart failure with reduced ejection fraction) ([place]) 04/11/2021   CAD (coronary artery disease) 04/11/2021   S/P below knee amputation, right ([place]) 02/07/2021   Cutaneous abscess of right foot    Diabetic foot infection ([place])    SOB (shortness of breath)    Sepsis ([place]) 01/23/2021   Cellulitis 01/23/2021   Elevated troponin 83/37/4451   Acute metabolic encephalopathy 46/10/7996   AKI (acute kidney injury) ([place]) 01/23/2021   COVID-19 virus infection 01/23/2021   Wound infection 10/21/2019   Burn, foot, second degree, unspecified laterality,  sequela 10/21/2019   QT prolongation 07/30/2019   Acute exacerbation of CHF (congestive heart failure) ([place]) 07/30/2019   Acute on chronic combined systolic and diastolic CHF (congestive heart failure) ([place]) 07/29/2019   Foot ulcer ([place]) 03/11/2018   Weakness 05/08/2017   Leukocytosis 05/08/2017   Stroke ([place]) 05/08/2017   Slurred speech 05/08/2017   Uncontrolled type 2 diabetes mellitus with hyperglycemia, with long-term current use of insulin ([place]) 12/07/2015   Noncompliance 72/15/8727   Chronic systolic heart failure ([place]) 10/02/2011   Erectile dysfunction 10/02/2011   Obstructive sleep apnea 10/11/2007   Diabetes mellitus with complication ([place]) 61/84/8592   HLD (hyperlipidemia) 10/10/2007   HYPOKALEMIA 10/10/2007   Obesity, Class III, BMI 40-49.9 (morbid obesity) ([place]) 10/10/2007   Essential hypertension 10/10/2007   PREMATURE VENTRICULAR CONTRACTIONS 10/10/2007   SYNCOPE 10/10/2007   COLONIC POLYPS, HX OF 10/10/2007    Jamey Reas, PT, DPT 07/26/2021, 12:28 PM  [facility] [address] [place], [place], [address] Phone: [phone]   Fax:  [phone]  Name: Mitchell [name] MRN: [id number] Date of Birth: [birth year]

## 2021-07-26 NOTE — Telephone Encounter (Signed)
Typically they do not meet requirements, like insulin dosing 3x day. Written denial placed in your inbox.

## 2021-07-27 ENCOUNTER — Telehealth: Payer: Self-pay

## 2021-07-27 ENCOUNTER — Ambulatory Visit: Payer: No Typology Code available for payment source | Admitting: Dietician

## 2021-07-27 NOTE — Telephone Encounter (Signed)
PA for  [employer] device sent to insurance plan

## 2021-07-28 ENCOUNTER — Ambulatory Visit (INDEPENDENT_AMBULATORY_CARE_PROVIDER_SITE_OTHER): Payer: No Typology Code available for payment source | Admitting: Physical Therapy

## 2021-07-28 ENCOUNTER — Encounter: Payer: Self-pay | Admitting: Physical Therapy

## 2021-07-28 ENCOUNTER — Other Ambulatory Visit: Payer: Self-pay

## 2021-07-28 DIAGNOSIS — M6281 Muscle weakness (generalized): Secondary | ICD-10-CM | POA: Diagnosis not present

## 2021-07-28 DIAGNOSIS — R2689 Other abnormalities of gait and mobility: Secondary | ICD-10-CM

## 2021-07-28 DIAGNOSIS — R293 Abnormal posture: Secondary | ICD-10-CM

## 2021-07-28 DIAGNOSIS — R2681 Unsteadiness on feet: Secondary | ICD-10-CM | POA: Diagnosis not present
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
## 2021-07-30 ENCOUNTER — Other Ambulatory Visit: Payer: Self-pay | Admitting: Physical Medicine and Rehabilitation

## 2021-08-02 ENCOUNTER — Telehealth: Payer: Self-pay | Admitting: Orthopedic Surgery

## 2021-08-02 ENCOUNTER — Other Ambulatory Visit: Payer: Self-pay | Admitting: Orthopedic Surgery

## 2021-08-02 ENCOUNTER — Telehealth: Payer: Self-pay | Admitting: Family Medicine

## 2021-08-02 ENCOUNTER — Other Ambulatory Visit (HOSPITAL_COMMUNITY): Payer: Self-pay

## 2021-08-02 MED ORDER — OXYCODONE-ACETAMINOPHEN 5-325 MG PO TABS: 1.0000 | ORAL_TABLET | ORAL | 0 refills | Status: DC | PRN

## 2021-08-02 NOTE — Telephone Encounter (Signed)
Pt called stating he needs a refill of his percocet 5-325 mg rx called in to his [facility]. He would like a CB when this has been called in please.   [phone]

## 2021-08-02 NOTE — Telephone Encounter (Deleted)
Called to inform patient.

## 2021-08-02 NOTE — Telephone Encounter (Signed)
Freestyle Libre device was denied

## 2021-08-02 NOTE — Telephone Encounter (Signed)
Oxycodone last filled 06/29/21 #30. S/P Right BKA 01/25/21 He is still currently doing PT Please advise.
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
## 2021-08-02 NOTE — Telephone Encounter (Signed)
Pt came in office wanting to get instructions in how to put on the new Freestyle Libre that was prescribe from another provider, at the facility pt was informed that pt needed to come to his PCP to get instruction how to use it. Pt is needing an order so pt can be put on Nurse schedule for instruction of how to use diabetic machine. Please advise and call pt to schedule [place] when orders are in. Pt tel [phone] or [phone] Ermalinda Memos daughter)

## 2021-08-02 NOTE — Telephone Encounter (Signed)
Called to inform patient that insurance did not approve the  [employer]. Patient stated that he has received it and it was put in his arm today.

## 2021-08-03 ENCOUNTER — Telehealth (HOSPITAL_COMMUNITY): Payer: Self-pay | Admitting: Licensed Clinical Social Worker

## 2021-08-03 NOTE — Telephone Encounter (Signed)
HF Paramedicine Team Based Care Meeting  HF MD- NA  HF NP - Amy Clegg NP-C   [facility] HF Paramedicine  Katie Vicente Males  [facility] admit within the last 30 days for heart failure? no  Medications concerns? Reports he has trouble seeing mediations  Transportation issues? Drives self   Education needs? no  SDOH -no  Eligible for discharge?  Thinks eligible for DC- taking medication appropriately and filled pill box while paramedic out despite reported issues with seeing  [name], LCSW Clinical Social Worker [facility] Desk#: [phone] Cell#: [phone]

## 2021-08-03 NOTE — Telephone Encounter (Signed)
Pt informed

## 2021-08-04 NOTE — Telephone Encounter (Signed)
So I sent a message through teams to see if we offered this teaching. Marchelle Folks stated this would need to have an order for teaching for the nurse schedule.

## 2021-08-04 NOTE — Telephone Encounter (Signed)
Okay, I am not aware how to place an order for glucose monitor teaching-should we just put him on the nurse visit schedule?  I would think Apolonio Schneiders could do teaching with him if we can arrange a nurse visit with her

## 2021-08-05 ENCOUNTER — Telehealth (HOSPITAL_COMMUNITY): Payer: Self-pay | Admitting: Licensed Clinical Social Worker

## 2021-08-05 NOTE — Telephone Encounter (Signed)
Okay so After speaking with our Marchelle Folks and Selena Batten about it they have determined that we cant do that here because we didnt order it (and no one knows how to use it ) So sadly, he (or we) will have to call the provider that ordered it for him. Per Marchelle Folks- As a courtesy, I may want to reach out to them (they have phone notes i there).

## 2021-08-05 NOTE — Telephone Encounter (Signed)
Patient's PCP is sending a message concerning the teaching of the [facility]. They are wanting our office to give those teachings since we prescribed it. Please advise.

## 2021-08-05 NOTE — Telephone Encounter (Signed)
Pt requesting CSW call him regarding a notice he got from [employer].  CSW reached out to discuss- pt busy at this time and would like to talk Monday- CSW will plan to follow up with him next week and assist as able  [name], LCSW Clinical Social Worker [facility] Desk#: [phone] Cell#: [phone]

## 2021-08-05 NOTE — Telephone Encounter (Signed)
Hi [place],   Pt came by the office a couple of days ago to have instructions for his [employer] system. Unfortunately, we do not offer these teachings in our office.

## 2021-08-06 NOTE — Patient Instructions (Addendum)
It was good to see you again today  Please consider getting the updated COVID-19 vaccine if not done already- ok to do today We did your pneumonia vaccine today   We will be in touch with your labs  Please see me in about 8 weeks for our next visit and to check your blood sugars
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
## 2021-08-08 ENCOUNTER — Ambulatory Visit (INDEPENDENT_AMBULATORY_CARE_PROVIDER_SITE_OTHER): Payer: No Typology Code available for payment source | Admitting: Family Medicine

## 2021-08-08 ENCOUNTER — Telehealth (HOSPITAL_COMMUNITY): Payer: Self-pay | Admitting: Licensed Clinical Social Worker

## 2021-08-08 VITALS — BP 134/80 | HR 70 | Temp 97.7°F | Resp 18 | Ht 71.0 in | Wt 280.6 lb

## 2021-08-08 DIAGNOSIS — E1169 Type 2 diabetes mellitus with other specified complication: Secondary | ICD-10-CM

## 2021-08-08 DIAGNOSIS — I10 Essential (primary) hypertension: Secondary | ICD-10-CM

## 2021-08-08 DIAGNOSIS — Z89511 Acquired absence of right leg below knee: Secondary | ICD-10-CM

## 2021-08-08 DIAGNOSIS — I5022 Chronic systolic (congestive) heart failure: Secondary | ICD-10-CM | POA: Diagnosis not present

## 2021-08-08 DIAGNOSIS — Z794 Long term (current) use of insulin: Secondary | ICD-10-CM | POA: Diagnosis not present

## 2021-08-08 DIAGNOSIS — Z23 Encounter for immunization: Secondary | ICD-10-CM | POA: Diagnosis not present

## 2021-08-08 DIAGNOSIS — Z1159 Encounter for screening for other viral diseases: Secondary | ICD-10-CM

## 2021-08-08 LAB — BASIC METABOLIC PANEL
BUN: 18 mg/dL (ref 6–23)
CO2: 28 mEq/L (ref 19–32)
Calcium: 8.7 mg/dL (ref 8.4–10.5)
Chloride: 109 mEq/L (ref 96–112)
Creatinine, Ser: 1.21 mg/dL (ref 0.40–1.50)
GFR: 68.6 mL/min (ref >60.00)
Glucose, Bld: 118 mg/dL — ABNORMAL HIGH (ref 70–99)
Potassium: 3.8 mEq/L (ref 3.5–5.1)
Sodium: 142 mEq/L (ref 135–145)

## 2021-08-08 NOTE — Telephone Encounter (Signed)
CSW called pt back to discuss questions regarding his disability case.  Helped to explain to pt difference between SSDI and SSI- sounds as if pt was denied for SSDI but is still being considered for SSI.  Pt has lawyer that is assisting him so encouraged him to reach out to them to remain updated regarding his case.  Will continue to follow and assist as needed  [name], LCSW Clinical Social Worker [facility] Desk#: [phone] Cell#: [phone]

## 2021-08-09 ENCOUNTER — Telehealth (HOSPITAL_COMMUNITY): Payer: Self-pay

## 2021-08-09 LAB — HEPATITIS C ANTIBODY
Hepatitis C Ab: NONREACTIVE
SIGNAL TO CUT-OFF: 0.14 (ref <1.00)

## 2021-08-09 NOTE — Telephone Encounter (Signed)
I contacted pt this morning to sch home visit this week, he answered the phone very groggy-he just woke up-he wasn't in the position to decide a day and time yet.  I advised him to just call me once he wakes up and knows his sch this week.   Kerry Hough, EMT-Paramedic  08/09/21

## 2021-08-11 ENCOUNTER — Other Ambulatory Visit (HOSPITAL_COMMUNITY): Payer: Self-pay
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
## 2021-08-17 ENCOUNTER — Telehealth (HOSPITAL_COMMUNITY): Payer: Self-pay | Admitting: Licensed Clinical Social Worker

## 2021-08-17 NOTE — Telephone Encounter (Signed)
Pt called CSW to request help getting transportation to upcoming appts- rides set up through [facility] transport  No further needs at this time- will continue to follow and assist as needed  Jorge Ny, [facility] Desk#: [phone] Cell#: [phone]

## 2021-08-19 ENCOUNTER — Other Ambulatory Visit: Payer: Self-pay

## 2021-08-19 ENCOUNTER — Ambulatory Visit (INDEPENDENT_AMBULATORY_CARE_PROVIDER_SITE_OTHER): Payer: No Typology Code available for payment source | Admitting: Endocrinology

## 2021-08-19 VITALS — BP 120/86 | HR 75 | Ht 71.0 in | Wt 274.2 lb

## 2021-08-19 DIAGNOSIS — E118 Type 2 diabetes mellitus with unspecified complications: Secondary | ICD-10-CM

## 2021-08-19 LAB — POCT GLYCOSYLATED HEMOGLOBIN (HGB A1C): Hemoglobin A1C: 7.8 % — AB (ref 4.0–5.6)

## 2021-08-19 MED ORDER — BASAGLAR KWIKPEN 100 UNIT/ML ~~LOC~~ SOPN: 42.0000 [IU] | PEN_INJECTOR | SUBCUTANEOUS | 3 refills | Status: DC

## 2021-08-19 NOTE — Patient Instructions (Addendum)
Please continue the same [place] and: You can take all of the insulin in the morning.   check your blood sugar twice a day.  vary the time of day when you check, between before the 3 meals, and at bedtime.  also check if you have symptoms of your blood sugar being too high or too low.  please keep a record of the readings and bring it to your next appointment here (or you can bring the meter itself).  You can write it on any piece of paper.  please call us sooner if your blood sugar goes below 70, or if you have a lot of readings over 200.  Please come back for a follow-up appointment in 4 months.

## 2021-08-19 NOTE — Progress Notes (Signed)
Subjective:    Patient ID: Mitchell [name], male    DOB: Jan 20, 1971, 51 y.o.   MRN: [id number]  HPI Pt returns for f/u of diabetes mellitus: DM type: Insulin-requiring type 2 Dx'ed: [id number] Complications: PN, foot ulcer, CRI, DR, right BKA, CAD, and CVA.   Therapy: insulin since 2017, and Farxiga.   DKA: never Severe hypoglycemia: never Pancreatitis: never Pancreatic imaging: normal on 2012 CT.  Other: he declines multiple daily injections; CHF is a relative contraindication to metformin; he is disabled; he stopped CGM, due to it falling off.   Interval history: he says he has not recently missed the insulin.  He says cbg's vary from 81-276.  He stopped Trulicity, due to N/D.  She takes 21 units BID.   Past Medical History:  Diagnosis Date   Bipolar disorder ([place])    CAD (coronary artery disease)    a. diffuse 3v CAD by cath 2019, medical therapy recommended.   Cataract    forming    Chronic systolic CHF (congestive heart failure) (HCC)    CVA (cerebral infarction)    No residual deficits   Depression    PTSD,    Diabetes mellitus    TYPE II; UNCONTROLLED BY HEMOGLOBIN A1c; STABLE AS  PER DISCHARGE   Headache(784.0)    Herpes simplex of male genitalia    History of colonic polyps    Hyperlipidemia    Hypertension    Myocardial infarction ([place]) 1987   (while playing football)   Obesity    OSA (obstructive sleep apnea)    repeat study 2018 without significant OSA   Pneumonia    Post-cardiac injury syndrome ([place])    History of cardiac injury from blunt trauma   Pulmonary hypertension ([place])    a. moderately elevated PASP 07/2019.   PVCs (premature ventricular contractions)    Schizophrenia ([place])    Goes to [facility]   Sleep apnea    Stroke [facility]) 2005   some left side weakness   Syncope    Recurrent, thought to be vasovagal. Also has h/o frequent PVCs.     Past Surgical History:  Procedure Laterality Date   AMPUTATION Right 01/25/2021   Procedure:  RIGHT BELOW KNEE AMPUTATION;  Surgeon: [name], MD;  Location: [place];  Service: Orthopedics;  Laterality: Right;   CARDIAC CATHETERIZATION  12/19/10   DIFFUSE NONOBSTRUCTIVE CAD; NONISCHEMIC CARDIOMYOPATHY; LEFT VENTRICULAR ANGIOGRAM WAS PERFORMED SECONDARY TO  ELEVATED LEFT VENTRICULAR FILLING PRESSURES   COLONOSCOPY     ~ age [age]   COLONOSCOPY W/ POLYPECTOMY     I & D EXTREMITY Bilateral 10/24/2019   Procedure: IRRIGATION AND DEBRIDEMENT BILATERAL EXTREMITY WOUND ON FOOT;  Surgeon: Cindra Presume, MD;  Location: [place];  Service: Plastics;  Laterality: Bilateral;   METATARSAL HEAD EXCISION Right 08/29/2018   Procedure: METATARSAL HEAD RESECTION;  Surgeon: Edrick Kins, DPM;  Location: [place];  Service: Podiatry;  Laterality: Right;   METATARSAL OSTEOTOMY Right 08/29/2018   Procedure: SUB FIFTHE METATARSIA RIGHT FOOT;  Surgeon: Edrick Kins, DPM;  Location: [place];  Service: Podiatry;  Laterality: Right;   MULTIPLE EXTRACTIONS WITH ALVEOLOPLASTY  01/27/2014   "all my teeth; 4 Quadrants of alveoloplasty   MULTIPLE EXTRACTIONS WITH ALVEOLOPLASTY N/A 01/27/2014   Procedure: EXTRACTION OF TEETH #'1, 2, 3, 4, 5, 6, 7, 8, 9, 10, 11, 12, 13, 14, 15, 16, 17, 20, 21, 22, 23, 24, 25, 26, 27, 28, 29, 31 and 32 WITH ALVEOLOPLASTY;  Surgeon: Lenn Cal, DDS;  Location: [place];  Service: Oral Surgery;  Laterality: N/A;   ORIF FINGER / THUMB FRACTURE Right    POLYPECTOMY     RIGHT HEART CATH N/A 01/27/2021   Procedure: RIGHT HEART CATH;  Surgeon: Jolaine Artist, MD;  Location: [place] [facility];  Service: Cardiovascular;  Laterality: N/A;   RIGHT/LEFT HEART CATH AND CORONARY ANGIOGRAPHY N/A 09/26/2017   Procedure: RIGHT/LEFT HEART CATH AND CORONARY ANGIOGRAPHY;  Surgeon: Jolaine Artist, MD;  Location: [place] [facility];  Service: Cardiovascular;  Laterality: N/A;   RIGHT/LEFT HEART CATH AND CORONARY ANGIOGRAPHY N/A 04/12/2021   Procedure: RIGHT/LEFT HEART CATH AND CORONARY ANGIOGRAPHY;   Surgeon: Jolaine Artist, MD;  Location: [place] [facility];  Service: Cardiovascular;  Laterality: N/A;   SKIN SPLIT GRAFT Bilateral 10/24/2019   Procedure: SKIN GRAFT SPLIT THICKNESS LEFT THIGH;  Surgeon: Cindra Presume, MD;  Location: [place];  Service: Plastics;  Laterality: Bilateral;   WOUND DEBRIDEMENT Right 08/29/2018   Procedure: Debridement of ulcer on right fifth metatarsal;  Surgeon: Edrick Kins, DPM;  Location: [place];  Service: Podiatry;  Laterality: Right;    Social History   Socioeconomic History   Marital status: Married    Spouse name: Not on file   Number of children: 5   Years of education: Not on file   Highest education level: Not on file  Occupational History   Occupation: Mortician  Tobacco Use   Smoking status: Never   Smokeless tobacco: Never  Vaping Use   Vaping Use: Never used  Substance and Sexual Activity   Alcohol use: No   Drug use: No   Sexual activity: Not on file  Other Topics Concern   Not on file  Social History Narrative   MARRIED, [place]; 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   Social Determinants of Health   Financial Resource Strain: Medium Risk   Difficulty of Paying Living Expenses: Somewhat hard  Food Insecurity: No Food Insecurity   Worried About Charity fundraiser in the Last Year: Never true   Ran Out of Food in the Last Year: Never true  Transportation Needs: No Transportation Needs   Lack of Transportation (Medical): No   Lack of Transportation (Non-Medical): No  Physical Activity: Not on file  Stress: Not on file  Social Connections: Not on file  Intimate Partner Violence: Not on file    Current Outpatient Medications on File Prior to Visit  Medication Sig Dispense Refill   amitriptyline (ELAVIL) 10 MG tablet TAKE 1 TABLET BY MOUTH AT BEDTIME 30 tablet 0   ascorbic acid (VITAMIN C) 250 MG tablet Take 1 tablet (250 mg  total) by mouth daily. 30 tablet 0   aspirin 81 MG EC tablet Take 1 tablet (81 mg total) by mouth daily. 30 tablet 12   carvedilol (COREG) 12.5 MG tablet Take 1.5 tablets (18.75 mg total) by mouth 2 (two) times daily with a meal. 90 tablet 5   cholecalciferol (VITAMIN D3) 25 MCG (1000 UNIT) tablet Take 1,000 Units by mouth daily.     clopidogrel (PLAVIX) 75 MG tablet Take 1 tablet (75 mg total) by mouth daily. 30 tablet 3   Continuous Blood Gluc Receiver (FREESTYLE LIBRE 2 READER) DEVI 1 each by Does not apply route daily. 30 each 3   Continuous Blood Gluc  Sensor (FREESTYLE LIBRE 3 SENSOR) MISC 1 each by Does not apply route daily. Place 1 sensor on the skin every 14 days. Use to check glucose continuously 2 each 3   Continuous Blood Gluc Transmit (DEXCOM G6 TRANSMITTER) MISC 1 each by Does not apply route daily. 3 each 3   dapagliflozin propanediol (FARXIGA) 10 MG TABS tablet Take 1 tablet (10 mg total) by mouth daily before breakfast. 30 tablet 11   gabapentin (NEURONTIN) 100 MG capsule Take 1 capsule by mouth at bedtime 30 capsule 0   hydrocerin (EUCERIN) CREA Apply 1 application topically 2 (two) times daily. To left foot/leg 454 g 0   methocarbamol (ROBAXIN) 500 MG tablet TAKE 1 TABLET BY MOUTH EVERY 6 HOURS AS NEEDED FOR MUSCLE SPASM 60 tablet 0   oxyCODONE-acetaminophen (PERCOCET/ROXICET) 5-325 MG tablet Take 1 tablet by mouth every 4 (four) hours as needed for severe pain. 30 tablet 0   pantoprazole (PROTONIX) 40 MG tablet Take 1 tablet by mouth once daily 30 tablet 0   potassium chloride SA (KLOR-CON) 20 MEQ tablet Take 1 tablet with Torsemide. (Patient taking differently: as needed. Take 1 tablet with Torsemide.) 15 tablet 3   rosuvastatin (CRESTOR) 40 MG tablet Take 1 tablet (40 mg total) by mouth daily. 30 tablet 3   sacubitril-valsartan (ENTRESTO) 97-103 MG Take 1 tablet by mouth 2 (two) times daily. 60 tablet 11   spironolactone (ALDACTONE) 25 MG tablet Take 1 tablet (25 mg total) by  mouth daily. 30 tablet 3   torsemide (DEMADEX) 20 MG tablet Take 1 tablet (20 mg total) by mouth as needed. 15 tablet 3   No current facility-administered medications on file prior to visit.    Allergies  Allergen Reactions   Nsaids Anaphylaxis and Other (See Comments)    Able to tolerate aspirin    Trulicity [Dulaglutide] Nausea Only    Family History  Problem Relation Age of Onset   Heart disease Mother        MI   Heart failure Mother    Diabetes Mother        ALSO IN MOST OF HIS SIBLINGS; 2 UNLCES HAVE ALSO PASSED AWAY FROM DM   Cardiomyopathy Mother    Cancer - Ovarian Mother    Ovarian cancer Mother    Heart disease Father    Hypertension Father    Diabetes Father    Diabetes Sister    Diabetes Brother    Colon cancer Paternal Uncle    Colon cancer Paternal Uncle    Colon polyps Neg Hx    Esophageal cancer Neg Hx    Rectal cancer Neg Hx    Stomach cancer Neg Hx     BP 120/86    Pulse 75    Ht 5\' 11"  (1.803 m)    Wt 274 lb 3.2 oz (124.4 kg)    SpO2 98%    BMI 38.24 kg/m    Review of Systems He denies hypoglycemia.      Objective:   Physical Exam    Lab Results  Component Value Date   HGBA1C 7.8 (A) 08/19/2021      Assessment & Plan:  Insulin-requiring type 2 DM: uncontrolled.  N/D: due to Trulicity.  Patient Instructions  Please continue the same Farxiga and: You can take all of the insulin in the morning.   check your blood sugar twice a day.  vary the time of day when you check, between before the 3 meals, and at bedtime.  also  check if you have symptoms of your blood sugar being too high or too low.  please keep a record of the readings and bring it to your next appointment here (or you can bring the meter itself).  You can write it on any piece of paper.  please call us sooner if your blood sugar goes below 70, or if you have a lot of readings over 200.  Please come back for a follow-up appointment in 4 months.

## 2021-08-23 ENCOUNTER — Ambulatory Visit (INDEPENDENT_AMBULATORY_CARE_PROVIDER_SITE_OTHER): Payer: No Typology Code available for payment source | Admitting: Psychologist

## 2021-08-23 DIAGNOSIS — F321 Major depressive disorder, single episode, moderate: Secondary | ICD-10-CM

## 2021-08-23 NOTE — Progress Notes (Signed)
                Azarian Starace, PsyD 

## 2021-08-23 NOTE — Progress Notes (Signed)
[facility] Counselor/Therapist Progress Note  Patient ID: Mitchell [name], MRN: [id number],    Date: 08/23/2021  Time Spent: 1:02 pm to 1:43 pm; Total Time: 41 minutes  This session was held via video webex teletherapy due to the coronavirus risk at this time. The patient consented to video teletherapy and was located at his home during this session. He is aware it is the responsibility of the patient to secure confidentiality on his end of the session. The provider was in a private home office for the duration of this session. Limits of confidentiality were discussed with the patient.   Treatment Type: Individual Therapy  Reported Symptoms: Reported frustration related to disability services   Mental Status Exam: Appearance:  Neat     Behavior: Appropriate  Motor: Normal  Speech/Language:  Normal Rate  Affect: Appropriate  Mood: normal  Thought process: normal  Thought content:   WNL  Sensory/Perceptual disturbances:   WNL  Orientation: oriented to person, place, time/date, and situation  Attention: Good  Concentration: Fair  Memory: WNL  Fund of knowledge:  Fair  Insight:   Fair  Judgment:  Fair  Impulse Control: Good   Risk Assessment: Danger to Self:  No Self-injurious Behavior: No Danger to Others: No Duty to Warn:no Physical Aggression / Violence:No  Access to Firearms a concern: No  Gang Involvement:No   Subjective: Beginning the session, patient described himself as doing  poorly. Elaborating, he acknowledged that he did not complete his homework assignment. Despite that, he and his wife are communicating more consistently. He also indicated he is scheduled to undergo eye surgery in February. From there, he voiced frustration at not qualifying for disability services. Per the patient, it is because he does not have enough "credits". He processed his frustrations with the situation. He also voiced irritation at his perception that his lawyer is not  assisting him. After processing the emotions he was experiencing he asked to follow up. He denied suicidal and homicidal ideation.    Interventions:  Worked on developing a therapeutic relationship with the patient using active listening and reflective statements. Provided emotional support using empathy and validation. Used summary statements. Normalized and validated expressed thoughts and emotions that were expressed. Used socratic questions to assist the patient gain insight into self. Attempted to clarify what patient meant by "credits". Attempted to assist in problem solving. Explored what steps patient could take to care for self while hearing negative information. Processed how patient was feeling about upcoming eye surgery. Praised patient for improvement in communication with spouse. Discussed next steps for counseling. Provided empathic statements. Assessed for suicidal and homicidal ideation.   Homework: NA  Next Session: Emotional support  Diagnosis: F32.1 major depressive affective disorder, single episode, moderate  Plan: Client Abilities: Patient is friendly and easy to develop rapport  Client Preferences: Patient voiced interest in ACT and CBT  Client statement of Needs:  Process emotions  Treatment Level: Outpatient  Symptoms: Feeling down, sad, tearful, low self-esteem, rumination of negative thoughts, thoughts of hopelessness, and irritability. He denied suicidal and homicidal ideation.   Goals:    Accept emotional support from others around them Live life to the fullest, event though time may be limited Become as knowledgeable about the medical condition  Reduce fear, anxiety about the health condition  Accept the illness Accept the role of psychological and behavioral factors      Objectives: Target date for all objectives: 06/16/2022 Identify feelings associated with the illness Family members share  with each other feelings Identify the losses or limitations  that have been experienced Verbalize acceptance of the reality of the medical condition Commit to learning and implement a proactive approach to managing personal stresses Verbalize an understanding of the medical condition Work with therapist to develop a plan for coping with stress Learn and implement skills for managing stress Engage in social, productive activities that are possible Engage in faith based activities implement positive imagery Identify coping skills and sources of emotional support Patient's partner and family members verbalize their fears regarding severity of health condition Identify sources of emotional distress  Learning and implement calming skills to reduce overall anxiety Learn and implement problem solving strategies Identify and engage in pleasant activities Learning and implement personal and interpersonal skills to reduce anxiety and improve interpersonal relationships Learn to accept limitations in life and commit to tolerating, rather than avoiding, unpleasant emotions while accomplishing meaningful goals Identify major life conflicts from the past and present that form the basis for present anxiety Learn and implement behavioral strategies Verbalize an understanding and resolution of current interpersonal problems Learn and implement problem solving and decision making skills Learn and implement conflict resolution skills to resolve interpersonal problems Verbalize an understanding of healthy and unhealthy emotions verbalize insight into how past relationships may be influence current experiences with depression Use mindfulness and acceptance strategies and increase value based behavior  Increase hopeful statements about the future.    Interventions Teach about stress and ways to handle stress Assist the patient in developing a coping action plan for stressors Conduct skills based training for coping strategies Train problem focused skills Sort out  what activities the individual can do Encourage patient to rely upon his/her spiritual faith Teach the patient to use guided imagery Probe and evaluate family's ability to provide emotional support Allow family to share their fears Assist the patient in identifying, sorting through, and verbalizing the various feelings generated by his/her medical condition Meet with family members  Ask patient list out limitations  Use stress inoculation training  Use Acceptance and Commitment Therapy to help client accept uncomfortable realities in order to accomplish value-consistent goals Reinforce the client's insight into the role of his/her past emotional pain and present anxiety  Discuss examples demonstrating that unrealistic worry overestimates the probability of threats and underestimates patient's ability  Assist the patient in analyzing his or her worries Help patient understand that avoidance is reinforcing  Behavioral activation help the client explore the relationship, nature of the dispute,  Help the client develop new interpersonal skills and relationships Conduct Problem so living therapy Teach conflict resolution skills Use a process-experiential approach Conduct TLDP Conduct ACT  Patient and clinician reviewed treatment plan on 06/21/2021. The patient approved of the treatment plan.     Hilbert Corrigan, PsyD

## 2021-08-25 ENCOUNTER — Telehealth: Payer: Self-pay | Admitting: Orthopedic Surgery

## 2021-08-25 NOTE — Telephone Encounter (Signed)
Pt needing a work note stating what limitations he has, specify what the issue is that the pt has limitations for, and statement verifying that pt can use wheelchair, walker, or cane depending on what he needs for that day at the facility. The best call back number for pt is [phone]. Waiting on a call back from facility to be given the best fax for the [employer] (Attn: [place] (HR Rep)).

## 2021-08-25 NOTE — Telephone Encounter (Signed)
Pt brought back correct information to fax letter to. The contact person is the HR Rep for the [employer]: [employer] phone-[phone] and fax- [phone]. Pt asked for a call back once its been sent over at [phone].

## 2021-08-25 NOTE — Telephone Encounter (Signed)
I did not send any information for this pt is this something that [employer] has?

## 2021-08-26 ENCOUNTER — Other Ambulatory Visit: Payer: Self-pay

## 2021-08-26 NOTE — Telephone Encounter (Signed)
[employer] doesn't have anything. I think they are just asking for a note. Thanks.

## 2021-08-26 NOTE — Telephone Encounter (Signed)
I called [phone] [employer] and left a message for [place] HR to cb with fax number so that I can sent note.

## 2021-08-29 ENCOUNTER — Telehealth: Payer: Self-pay | Admitting: Orthopedic Surgery

## 2021-08-29 NOTE — Telephone Encounter (Signed)
Note was faxed.

## 2021-08-29 NOTE — Telephone Encounter (Signed)
Patient called. The fax number for the [employer] is [phone]

## 2021-08-29 NOTE — Telephone Encounter (Signed)
Note was faxed as requested

## 2021-09-02 ENCOUNTER — Telehealth (HOSPITAL_COMMUNITY): Payer: Self-pay | Admitting: Licensed Clinical Social Worker

## 2021-09-02 ENCOUNTER — Other Ambulatory Visit: Payer: Self-pay | Admitting: Orthopedic Surgery

## 2021-09-02 ENCOUNTER — Telehealth: Payer: Self-pay | Admitting: Orthopedic Surgery

## 2021-09-02 MED ORDER — OXYCODONE-ACETAMINOPHEN 5-325 MG PO TABS: 1.0000 | ORAL_TABLET | ORAL | 0 refills | Status: DC | PRN

## 2021-09-02 NOTE — Telephone Encounter (Signed)
Pt called CSW to confirm he had [facility] set up for this coming tuesdays appt.  CSW able to confirm ride is set and provided pt with anticipated pick up time.  Pt then inquired about 2/14 appt- CSW able to set up ride through [employer].  CSW informed pt that [facility] will no longer be a consistent option in the coming month so discussed other possible options.  Pt reports he was already informed about [facility] and will be calling them to get established so he should be able to utilize that service after he is approved.  Will continue to follow and assist as needed  [name], LCSW Clinical Social Worker [facility] Desk#: [phone] Cell#: [phone]

## 2021-09-02 NOTE — Telephone Encounter (Signed)
Pt asking to get a refill on his medication of oxycodone. The best pharmacy is [facility] - [place], [place] - [address] The pt asked that he get a call back once its been sent in at [phone]. °

## 2021-09-02 NOTE — Telephone Encounter (Signed)
Pt informed

## 2021-09-02 NOTE — Telephone Encounter (Signed)
Pt asking to get a refill on his medication of oxycodone. The best pharmacy is [facility] [address] [place] - [address] The pt asked that he get a call back once its been sent in at [phone].

## 2021-09-02 NOTE — Telephone Encounter (Signed)
Oxycodone last filled 08/02/21 #30, s/p Right [facility]KA 12/2020

## 2021-09-05 ENCOUNTER — Other Ambulatory Visit: Payer: Self-pay | Admitting: Physical Medicine and Rehabilitation

## 2021-09-06 ENCOUNTER — Encounter: Payer: No Typology Code available for payment source | Attending: Family Medicine | Admitting: Registered"

## 2021-09-06 ENCOUNTER — Other Ambulatory Visit: Payer: Self-pay

## 2021-09-06 ENCOUNTER — Telehealth (HOSPITAL_COMMUNITY): Payer: Self-pay | Admitting: Licensed Clinical Social Worker

## 2021-09-06 ENCOUNTER — Encounter: Payer: Self-pay | Admitting: Registered"

## 2021-09-06 DIAGNOSIS — E1165 Type 2 diabetes mellitus with hyperglycemia: Secondary | ICD-10-CM | POA: Insufficient documentation

## 2021-09-06 DIAGNOSIS — Z794 Long term (current) use of insulin: Secondary | ICD-10-CM | POA: Diagnosis not present

## 2021-09-06 DIAGNOSIS — E119 Type 2 diabetes mellitus without complications: Secondary | ICD-10-CM

## 2021-09-06 NOTE — Telephone Encounter (Signed)
Pt called to inform CSW he no longer needs ride to appt today- CSW canceled through [facility] transport.  Also rescheduled [facility] ride for appt on 2/23  Will continue to follow and assist as needed  [name], LCSW Clinical Social Worker [facility] Desk#: [phone] Cell#: [phone]

## 2021-09-06 NOTE — Patient Instructions (Signed)
-   Aim to have 1/2 plate of non-starchy vegetables with lunch and dinner daily.   - Aim to balance breakfast with carbohydrates and protein such as oatmeal + boiled egg.   - Resume checking blood glucose 2 times a day (fasting and evening) and as needed.   - Put pieces of candy in travel bag.

## 2021-09-06 NOTE — Progress Notes (Signed)
Diabetes Self-Management Education  Visit Type:  First/Initial  Appt. Start Time: 11:16 Appt. End Time: 12:33  09/07/2021  [name], identified by name and date of birth, is a 51 y.o. male with a diagnosis of Diabetes: Type 2.   ASSESSMENT  Pt arrives visually impaired and unable to sign paperwork. Pt states he started taking insulin a few years, he thinks. Reports he wants education on medications related to sexual health. States he has been having diarrhea lately and getting tired of running to the bathroom. States he is getting tired of it and wants to know which medication is causing diarrhea as well. States he started having diarrhea after having his leg amputated in 2022. Reports he will have eye surgery on 2/28 and 3/23.   States he needs to get another glucometer. States he was recommended to check BS 2x/day. States he checks when he thinks about it, guesses it is about once a day (135-187). States he was told to take insulin if BS is 150 or above and not to take it if 75 or below. States he was taking 21 units, 2x/day per PCP. Per endocrinologist, he changed regimen to 42 units once/day and had hypoglycemic episodes. Pt was provided glucometer: [employer] Lot [id number] X Exp 10/28/2021  States he doesn't cook anymore. States his 2 daughters will cook for him (ages 38 and 57) at times. States he has a hard time getting up to get breakfast. Reports no one will make him breakfast and wants to know if he can eat instant oatmeal as easy breakfast option.   States he was shaky recently and daughter called ambulance. Reports BS had gone down. States he drunk a regular Gatorade. States he doesn't eat enough during the day. States he has fluid restriction but unsure of amount.   Pt expectations: more information about decreasing A1c and how to help diabetic retinopathy. Wants to know more about diabetes and sexual health. Wants to know who he can talk to about this.   There  were no vitals taken for this visit. There is no height or weight on file to calculate BMI.    Diabetes Self-Management Education - 09/06/21 1136       Health Coping   How would you rate your overall health? Good      Psychosocial Assessment   Patient Belief/Attitude about Diabetes Motivated to manage diabetes    Self-care barriers Impaired vision;Unsteady gait/risk for falls    Self-management support Family    Patient Concerns Nutrition/Meal planning    Special Needs Verbal instruction    Preferred Learning Style Auditory    Learning Readiness Ready      Complications   Last HgB A1C per patient/outside source 7.8 %    How often do you check your blood sugar? 1-2 times/day    Postprandial Blood glucose range (mg/dL) 341-962;229-798    Number of hypoglycemic episodes per month 1    Can you tell when your blood sugar is low? Yes    What do you do if your blood sugar is low? drinks orange juice/Gatorade or eats a piece of candy    Number of hyperglycemic episodes per week 0    Have you had a dilated eye exam in the past 12 months? Yes      Dietary Intake   Breakfast typically skips    Lunch 12 pm - [employer] - chicken sandwich + green beans    Snack (afternoon) 2 oranges    Dinner  8 pm - steak + mashed potatoes    Snack (evening) 1 orange    Beverage(s) water (2*12 oz; 24 oz), Sprite, Gatorade zero      Patient Education   Previous Diabetes Education No    Nutrition management  Role of diet in the treatment of diabetes and the relationship between the three main macronutrients and blood glucose level;Meal options for control of blood glucose level and chronic complications.    Monitoring Purpose and frequency of SMBG.    Acute complications Taught treatment of hypoglycemia - the 15 rule.    Personal strategies to promote health Lifestyle issues that need to be addressed for better diabetes care      Individualized Goals (developed by patient)   Nutrition General  guidelines for healthy choices and portions discussed    Medications take my medication as prescribed    Monitoring  test my blood glucose as discussed    Reducing Risk treat hypoglycemia with 15 grams of carbs if blood glucose less than 70mg /dL;examine blood glucose patterns      Post-Education Assessment   Patient understands the diabetes disease and treatment process. Demonstrates understanding / competency    Patient understands incorporating nutritional management into lifestyle. Needs Review    Patient undertands incorporating physical activity into lifestyle. Needs Instruction    Patient understands using medications safely. Needs Instruction    Patient understands monitoring blood glucose, interpreting and using results Needs Review    Patient understands prevention, detection, and treatment of acute complications. Demonstrates understanding / competency    Patient understands prevention, detection, and treatment of chronic complications. Needs Review    Patient understands how to develop strategies to address psychosocial issues. Needs Review    Patient understands how to develop strategies to promote health/change behavior. Needs Review      Outcomes   Program Status Not Completed             Learning Objective:  Patient will have a greater understanding of diabetes self-management. Patient education plan is to attend individual and/or group sessions per assessed needs and concerns.   Plan:   Patient Instructions  - Aim to have 1/2 plate of non-starchy vegetables with lunch and dinner daily.   - Aim to balance breakfast with carbohydrates and protein such as oatmeal + boiled egg.   - Resume checking blood glucose 2 times a day (fasting and evening) and as needed.   - Put pieces of candy in travel bag.    Expected Outcomes:  Demonstrated limited interest in learning.  Expect minimal changes  Education material provided: ADA - How to Thrive: A Guide for Your Journey  with Diabetes  If problems or questions, patient to contact team via:  Phone and Email  Future DSME appointment: - 4-6 wks

## 2021-09-13 ENCOUNTER — Other Ambulatory Visit: Payer: Self-pay

## 2021-09-13 ENCOUNTER — Ambulatory Visit (HOSPITAL_COMMUNITY): Payer: No Typology Code available for payment source

## 2021-09-13 ENCOUNTER — Encounter (HOSPITAL_COMMUNITY): Payer: No Typology Code available for payment source | Admitting: Internal Medicine

## 2021-09-14 ENCOUNTER — Ambulatory Visit (INDEPENDENT_AMBULATORY_CARE_PROVIDER_SITE_OTHER): Payer: No Typology Code available for payment source

## 2021-09-14 VITALS — BP 142/78 | HR 75

## 2021-09-14 DIAGNOSIS — R079 Chest pain, unspecified: Secondary | ICD-10-CM

## 2021-09-14 NOTE — Progress Notes (Addendum)
Patient presented to waiting room (with wife) with complaints of feeling hot and sweaty with epigastric pain. Also reports left rib pain x 2 nights. Denies SOB/dyspnea or chest pressure.   FSBS=213 BP=142/78 HR=75 Pox=97%  Dr Patsy Lager consulted, EKG obtained per verbal order.  Per PCP instructions, recommends ER visit for further workup.  Patient declines at this time and will call EMS if symptoms worsen or do not improve.   Alysia Penna  Patient came in today accompanying his wife.  While in the waiting room he complained of feeling hot. He states he has been awoken at night the last few days with pain in his left ribs, however this pain is not present now.  He denies any chest pain, denies chest pressure, denies any shortness of breath I am not able to reproduce rib tenderness on exam  EKG is abnormal but appears similar to tracing done September 2022.  Offered to have patient seen in the emergency room, counseled him that I cannot rule out a cardiac problem at this time.  He declines ER visit, states he will contact his cardiologist for follow-up.  I will also send a message to Dr. Gala Romney requesting a follow-up visit Patient also states he did not hear from me regarding his lab work done in January.  He was sent a letter with those results, will reprint and send to him again  Arthor Captain MD

## 2021-09-15 ENCOUNTER — Encounter (HOSPITAL_BASED_OUTPATIENT_CLINIC_OR_DEPARTMENT_OTHER): Payer: Self-pay | Admitting: Emergency Medicine

## 2021-09-15 ENCOUNTER — Other Ambulatory Visit: Payer: Self-pay

## 2021-09-15 ENCOUNTER — Emergency Department (HOSPITAL_BASED_OUTPATIENT_CLINIC_OR_DEPARTMENT_OTHER): Payer: No Typology Code available for payment source

## 2021-09-15 ENCOUNTER — Emergency Department (HOSPITAL_BASED_OUTPATIENT_CLINIC_OR_DEPARTMENT_OTHER)
Admission: EM | Admit: 2021-09-15 | Discharge: 2021-09-15 | Disposition: A | Discharge status: Home / Self Care | Payer: No Typology Code available for payment source | Attending: Emergency Medicine | Admitting: Emergency Medicine

## 2021-09-15 DIAGNOSIS — Z7982 Long term (current) use of aspirin: Secondary | ICD-10-CM | POA: Insufficient documentation

## 2021-09-15 DIAGNOSIS — N189 Chronic kidney disease, unspecified: Secondary | ICD-10-CM

## 2021-09-15 DIAGNOSIS — R1012 Left upper quadrant pain: Secondary | ICD-10-CM | POA: Diagnosis not present

## 2021-09-15 DIAGNOSIS — I2510 Atherosclerotic heart disease of native coronary artery without angina pectoris: Secondary | ICD-10-CM | POA: Diagnosis not present

## 2021-09-15 DIAGNOSIS — R61 Generalized hyperhidrosis: Secondary | ICD-10-CM | POA: Diagnosis not present

## 2021-09-15 DIAGNOSIS — Z794 Long term (current) use of insulin: Secondary | ICD-10-CM | POA: Diagnosis not present

## 2021-09-15 DIAGNOSIS — N390 Urinary tract infection, site not specified: Secondary | ICD-10-CM

## 2021-09-15 LAB — URINALYSIS, ROUTINE W REFLEX MICROSCOPIC
Bilirubin Urine: NEGATIVE
Glucose, UA: >1000 mg/dL — AB
Hgb urine dipstick: NEGATIVE
Ketones, ur: NEGATIVE mg/dL
Nitrite: NEGATIVE
Specific Gravity, Urine: 1.026 (ref 1.005–1.030)
pH: 5.5 (ref 5.0–8.0)

## 2021-09-15 LAB — CBC WITH DIFFERENTIAL/PLATELET
Abs Immature Granulocytes: 0.02 10*3/uL (ref 0.00–0.07)
Basophils Absolute: 0 10*3/uL (ref 0.0–0.1)
Basophils Relative: 0 %
Eosinophils Absolute: 0.1 10*3/uL (ref 0.0–0.5)
Eosinophils Relative: 1 %
HCT: 40.2 % (ref 39.0–52.0)
Hemoglobin: 12.6 g/dL — ABNORMAL LOW (ref 13.0–17.0)
Immature Granulocytes: 0 %
Lymphocytes Relative: 19 %
Lymphs Abs: 1.7 10*3/uL (ref 0.7–4.0)
MCH: 28.6 pg (ref 26.0–34.0)
MCHC: 31.3 g/dL (ref 30.0–36.0)
MCV: 91.4 fL (ref 80.0–100.0)
Monocytes Absolute: 0.6 10*3/uL (ref 0.1–1.0)
Monocytes Relative: 6 %
Neutro Abs: 6.5 10*3/uL (ref 1.7–7.7)
Neutrophils Relative %: 74 %
Platelets: 142 10*3/uL — ABNORMAL LOW (ref 150–400)
RBC: 4.4 MIL/uL (ref 4.22–5.81)
RDW: 12.7 % (ref 11.5–15.5)
WBC: 8.9 10*3/uL (ref 4.0–10.5)
nRBC: 0 % (ref 0.0–0.2)

## 2021-09-15 LAB — COMPREHENSIVE METABOLIC PANEL
ALT: 11 U/L (ref 0–44)
AST: 15 U/L (ref 15–41)
Albumin: 4.3 g/dL (ref 3.5–5.0)
Alkaline Phosphatase: 60 U/L (ref 38–126)
Anion gap: 10 (ref 5–15)
BUN: 33 mg/dL — ABNORMAL HIGH (ref 6–20)
CO2: 27 mmol/L (ref 22–32)
Calcium: 9.3 mg/dL (ref 8.9–10.3)
Chloride: 105 mmol/L (ref 98–111)
Creatinine, Ser: 1.74 mg/dL — ABNORMAL HIGH (ref 0.61–1.24)
GFR, Estimated: 46 mL/min — ABNORMAL LOW (ref >60)
Glucose, Bld: 147 mg/dL — ABNORMAL HIGH (ref 70–99)
Potassium: 3.9 mmol/L (ref 3.5–5.1)
Sodium: 142 mmol/L (ref 135–145)
Total Bilirubin: 0.5 mg/dL (ref 0.3–1.2)
Total Protein: 7.3 g/dL (ref 6.5–8.1)

## 2021-09-15 LAB — LIPASE, BLOOD: Lipase: 26 U/L (ref 11–51)

## 2021-09-15 LAB — TROPONIN I (HIGH SENSITIVITY): Troponin I (High Sensitivity): 13 ng/L (ref <18)

## 2021-09-15 MED ORDER — SODIUM CHLORIDE 0.9 % IV SOLN: INTRAVENOUS | Status: DC

## 2021-09-15 MED ORDER — LORAZEPAM 2 MG/ML IJ SOLN
0.5000 mg | Freq: Once | INTRAMUSCULAR | Status: AC
  Administered 2021-09-15: 0.5 mg via INTRAVENOUS
  Filled 2021-09-15: qty 1

## 2021-09-15 MED ORDER — CEFDINIR 300 MG PO CAPS: 300.0000 mg | ORAL_CAPSULE | Freq: Two times a day (BID) | ORAL | 0 refills | Status: DC

## 2021-09-15 NOTE — Discharge Instructions (Signed)
Your creatinine today was 1.72.  Call your doctor tomorrow to schedule a follow-up visit for this

## 2021-09-15 NOTE — ED Notes (Signed)
Dc instructions reviewed with patient. Patient voiced understanding. Dc with belongings.  °

## 2021-09-15 NOTE — ED Notes (Signed)
Patient verbalizes understanding of discharge instructions. Opportunity for questioning and answers were provided. Patient discharged from ED.  °

## 2021-09-15 NOTE — ED Triage Notes (Incomplete)
Pt arrives to ED with c/o abdominal bloating and left sided flank pain. This pain started 4-5 days ago. Pain is worse at night. No N/V/D. The pain is intermittent and described as dull.

## 2021-09-15 NOTE — ED Provider Notes (Signed)
[facility] EMERGENCY DEPT Provider Note   CSN: [id number] Arrival date & time: 09/15/21  1441     History  Chief Complaint  Patient presents with   Abdominal Pain   Flank Pain    Salmaan Patchin Mcadory is a 51 y.o. male.  51 year old male presents with left-sided flank pain times several days.  Pain waxes and wanes.  Not associate urinary symptoms.  No fever or chills.  Was at a doctor's office yesterday with his wife and became diaphoretic.  He had no chest pain or chest pressure with this.  He does have extensive cardiac history.  Today he denies any cardiac symptoms.  States the pain is pinpoint and at his left upper abdomen.  No associated fever.  No emesis noted.  No treatment use for this prior to arrival      Home Medications Prior to Admission medications   Medication Sig Start Date End Date Taking? Authorizing Provider  amitriptyline (ELAVIL) 10 MG tablet TAKE 1 TABLET BY MOUTH AT BEDTIME 09/06/21   [name], Drema Pry, MD  ascorbic acid (VITAMIN C) 250 MG tablet Take 1 tablet (250 mg total) by mouth daily. 02/18/21   Love, Evlyn Kanner, PA-C  aspirin 81 MG EC tablet Take 1 tablet (81 mg total) by mouth daily. 04/13/21   Danford, Earl Lites, MD  carvedilol (COREG) 12.5 MG tablet Take 1.5 tablets (18.75 mg total) by mouth 2 (two) times daily with a meal. 06/01/21   Milford, Anderson Malta, FNP  cholecalciferol (VITAMIN D3) 25 MCG (1000 UNIT) tablet Take 1,000 Units by mouth daily.    [provider]  clopidogrel (PLAVIX) 75 MG tablet Take 1 tablet (75 mg total) by mouth daily. 04/20/21   Milford, Anderson Malta, FNP  Continuous Blood Gluc Receiver (FREESTYLE LIBRE 2 READER) DEVI 1 each by Does not apply route daily. Patient not taking: Reported on 09/06/2021 07/25/21   Horton Chin, MD  Continuous Blood Gluc Sensor (FREESTYLE LIBRE 3 SENSOR) MISC 1 each by Does not apply route daily. Place 1 sensor on the skin every 14 days. Use to check glucose continuously Patient not  taking: Reported on 09/06/2021 07/25/21   Horton Chin, MD  Continuous Blood Gluc Transmit (DEXCOM G6 TRANSMITTER) MISC 1 each by Does not apply route daily. Patient not taking: Reported on 09/06/2021 07/15/21   Horton Chin, MD  dapagliflozin propanediol (FARXIGA) 10 MG TABS tablet Take 1 tablet (10 mg total) by mouth daily before breakfast. 05/04/21   Milford, Anderson Malta, FNP  gabapentin (NEURONTIN) 100 MG capsule Take 1 capsule by mouth at bedtime 07/28/21   Nadara Mustard, MD  hydrocerin (EUCERIN) CREA Apply 1 application topically 2 (two) times daily. To left foot/leg 02/17/21   Love, Evlyn Kanner, PA-C  Insulin Glargine [facility]) 100 UNIT/ML Inject 42 Units into the skin every morning. 08/19/21   Romero Belling, MD  methocarbamol (ROBAXIN) 500 MG tablet TAKE 1 TABLET BY MOUTH EVERY 6 HOURS AS NEEDED FOR MUSCLE SPASM 08/03/21   [name], Drema Pry, MD  oxyCODONE-acetaminophen (PERCOCET/ROXICET) 5-325 MG tablet Take 1 tablet by mouth every 4 (four) hours as needed for severe pain. 09/02/21   Nadara Mustard, MD  pantoprazole (PROTONIX) 40 MG tablet Take 1 tablet by mouth once daily 08/03/21   [name], Drema Pry, MD  potassium chloride SA (KLOR-CON) 20 MEQ tablet Take 1 tablet with Torsemide. Patient taking differently: as needed. Take 1 tablet with Torsemide. 04/21/21   Andrey Farmer, PA-C  rosuvastatin (CRESTOR) 40 MG tablet Take 1 tablet (40 mg total) by mouth daily. 04/20/21   Milford, Anderson Malta, FNP  sacubitril-valsartan (ENTRESTO) 97-103 MG Take 1 tablet by mouth 2 (two) times daily. 05/04/21   Jacklynn Ganong, FNP  spironolactone (ALDACTONE) 25 MG tablet Take 1 tablet (25 mg total) by mouth daily. 04/20/21   Jacklynn Ganong, FNP  torsemide (DEMADEX) 20 MG tablet Take 1 tablet (20 mg total) by mouth as needed. 04/21/21   Andrey Farmer, PA-C      Allergies    Nsaids and Trulicity [dulaglutide]    Review of Systems   Review of Systems  All other systems reviewed and are  negative.  Physical Exam Updated Vital Signs BP 94/64    Pulse 70    Temp 98.6 F (37 C) (Oral)    Resp 15    Ht 1.803 m (5\' 11" )    Wt 127 kg    SpO2 99%    BMI 39.05 kg/m  Physical Exam Vitals and nursing note reviewed.  Constitutional:      General: He is not in acute distress.    Appearance: Normal appearance. He is well-developed. He is not toxic-appearing.  HENT:     Head: Normocephalic and atraumatic.  Eyes:     General: Lids are normal.     Conjunctiva/sclera: Conjunctivae normal.     Pupils: Pupils are equal, round, and reactive to light.  Neck:     Thyroid: No thyroid mass.     Trachea: No tracheal deviation.  Cardiovascular:     Rate and Rhythm: Normal rate and regular rhythm.     Heart sounds: Normal heart sounds. No murmur heard.   No gallop.  Pulmonary:     Effort: Pulmonary effort is normal. No respiratory distress.     Breath sounds: Normal breath sounds. No stridor. No decreased breath sounds, wheezing, rhonchi or rales.  Abdominal:     General: There is no distension.     Palpations: Abdomen is soft.     Tenderness: There is no abdominal tenderness. There is no rebound.    Musculoskeletal:        General: No tenderness. Normal range of motion.     Cervical back: Normal range of motion and neck supple.  Skin:    General: Skin is warm and dry.     Findings: No abrasion or rash.  Neurological:     Mental Status: He is alert and oriented to person, place, and time. Mental status is at baseline.     GCS: GCS eye subscore is 4. GCS verbal subscore is 5. GCS motor subscore is 6.     Cranial Nerves: No cranial nerve deficit.     Sensory: No sensory deficit.     Motor: Motor function is intact.  Psychiatric:        Attention and Perception: Attention normal.        Speech: Speech normal.        Behavior: Behavior normal.    ED Results / Procedures / Treatments   Labs (all labs ordered are listed, but only abnormal results are displayed) Labs Reviewed   URINALYSIS, ROUTINE W REFLEX MICROSCOPIC  CBC WITH DIFFERENTIAL/PLATELET  COMPREHENSIVE METABOLIC PANEL  LIPASE, BLOOD  TROPONIN I (HIGH SENSITIVITY)    EKG EKG Interpretation  Date/Time:  Thursday September 15 2021 15:01:14 EST Ventricular Rate:  71 PR Interval:  198 QRS Duration: 133 QT Interval:  447 QTC Calculation: 486 R Axis:   -  59 Text Interpretation: Sinus rhythm Left bundle branch block Confirmed by Alona Bene [id number]) on 09/15/2021 3:06:43 PM  Radiology No results found.  Procedures Procedures    Medications Ordered in ED Medications  0.9 %  sodium chloride infusion (has no administration in time range)  LORazepam (ATIVAN) injection 0.5 mg (has no administration in time range)    ED Course/ Medical Decision Making/ A&P                           Medical Decision Making Amount and/or Complexity of Data Reviewed Labs: ordered. Radiology: ordered. ECG/medicine tests: ordered.  Risk Prescription drug management.   Patient presented with left-sided flank pain.  Concern for possible kidney stone.  Patient's urinalysis does show 11-20 white cells.  He has been afebrile.  Do not think he has pyelonephritis.  He did have a subsequent renal CT which did not show any evidence of infection.  He has been afebrile here.  He also does not have any leukocytosis supporting that this is likely just a simple UTI.  Patient does have history of chronic kidney disease and his creatinine is worse than prior.  Patient is followed by his primary care doctor for this and is scheduled for further evaluation of this.  Patient also had episode of diaphoresis yesterday and given his history of CAD patient had EKG and troponin both of which were negative for ACS.  He has not had any chest pain today.  Plan will be to place patient on oral antibiotics for his UTI.  Suspect musculoskeletal etiology of his current pain.        Final Clinical Impression(s) / ED Diagnoses Final diagnoses:   None    Rx / DC Orders ED Discharge Orders     None         Lorre Nick, MD 09/15/21 Flossie Buffy

## 2021-09-15 NOTE — ED Notes (Signed)
Pt refused to try and urinate for urine sample. States he doesn't feel like it. Will try later

## 2021-09-15 NOTE — ED Notes (Signed)
Patient transported to CT 

## 2021-09-16 ENCOUNTER — Ambulatory Visit: Payer: No Typology Code available for payment source | Admitting: Psychologist
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
## 2021-09-22 ENCOUNTER — Other Ambulatory Visit: Payer: Self-pay

## 2021-09-22 ENCOUNTER — Ambulatory Visit (INDEPENDENT_AMBULATORY_CARE_PROVIDER_SITE_OTHER): Payer: No Typology Code available for payment source | Admitting: Family Medicine

## 2021-09-22 ENCOUNTER — Ambulatory Visit (HOSPITAL_BASED_OUTPATIENT_CLINIC_OR_DEPARTMENT_OTHER)
Admission: RE | Admit: 2021-09-22 | Discharge: 2021-09-22 | Disposition: A | Discharge status: Home / Self Care | Payer: No Typology Code available for payment source | Source: Ambulatory Visit | Attending: Internal Medicine | Admitting: Internal Medicine

## 2021-09-22 ENCOUNTER — Ambulatory Visit (HOSPITAL_COMMUNITY)
Admission: RE | Admit: 2021-09-22 | Discharge: 2021-09-22 | Disposition: A | Discharge status: Home / Self Care | Payer: No Typology Code available for payment source | Source: Ambulatory Visit | Attending: Family Medicine | Admitting: Family Medicine

## 2021-09-22 ENCOUNTER — Encounter (HOSPITAL_COMMUNITY): Payer: Self-pay

## 2021-09-22 VITALS — BP 132/80 | HR 72 | Temp 97.9°F | Resp 18 | Ht 71.0 in | Wt 275.6 lb

## 2021-09-22 VITALS — BP 162/98 | HR 72 | Wt 279.0 lb

## 2021-09-22 DIAGNOSIS — E1151 Type 2 diabetes mellitus with diabetic peripheral angiopathy without gangrene: Secondary | ICD-10-CM | POA: Diagnosis not present

## 2021-09-22 DIAGNOSIS — Z7984 Long term (current) use of oral hypoglycemic drugs: Secondary | ICD-10-CM | POA: Insufficient documentation

## 2021-09-22 DIAGNOSIS — Z794 Long term (current) use of insulin: Secondary | ICD-10-CM | POA: Insufficient documentation

## 2021-09-22 DIAGNOSIS — E669 Obesity, unspecified: Secondary | ICD-10-CM | POA: Insufficient documentation

## 2021-09-22 DIAGNOSIS — Z79899 Other long term (current) drug therapy: Secondary | ICD-10-CM | POA: Insufficient documentation

## 2021-09-22 DIAGNOSIS — Z89511 Acquired absence of right leg below knee: Secondary | ICD-10-CM

## 2021-09-22 DIAGNOSIS — E785 Hyperlipidemia, unspecified: Secondary | ICD-10-CM | POA: Diagnosis not present

## 2021-09-22 DIAGNOSIS — N3000 Acute cystitis without hematuria: Secondary | ICD-10-CM | POA: Diagnosis not present

## 2021-09-22 DIAGNOSIS — I110 Hypertensive heart disease with heart failure: Secondary | ICD-10-CM | POA: Insufficient documentation

## 2021-09-22 DIAGNOSIS — N390 Urinary tract infection, site not specified: Secondary | ICD-10-CM | POA: Insufficient documentation

## 2021-09-22 DIAGNOSIS — I5022 Chronic systolic (congestive) heart failure: Secondary | ICD-10-CM | POA: Insufficient documentation

## 2021-09-22 DIAGNOSIS — I272 Pulmonary hypertension, unspecified: Secondary | ICD-10-CM | POA: Diagnosis not present

## 2021-09-22 DIAGNOSIS — I255 Ischemic cardiomyopathy: Secondary | ICD-10-CM | POA: Insufficient documentation

## 2021-09-22 DIAGNOSIS — H547 Unspecified visual loss: Secondary | ICD-10-CM | POA: Diagnosis not present

## 2021-09-22 DIAGNOSIS — F319 Bipolar disorder, unspecified: Secondary | ICD-10-CM | POA: Diagnosis not present

## 2021-09-22 DIAGNOSIS — I428 Other cardiomyopathies: Secondary | ICD-10-CM | POA: Insufficient documentation

## 2021-09-22 DIAGNOSIS — Z7982 Long term (current) use of aspirin: Secondary | ICD-10-CM | POA: Diagnosis not present

## 2021-09-22 DIAGNOSIS — Z7902 Long term (current) use of antithrombotics/antiplatelets: Secondary | ICD-10-CM | POA: Insufficient documentation

## 2021-09-22 DIAGNOSIS — G4733 Obstructive sleep apnea (adult) (pediatric): Secondary | ICD-10-CM | POA: Insufficient documentation

## 2021-09-22 DIAGNOSIS — N289 Disorder of kidney and ureter, unspecified: Secondary | ICD-10-CM

## 2021-09-22 DIAGNOSIS — Z8616 Personal history of COVID-19: Secondary | ICD-10-CM | POA: Diagnosis not present

## 2021-09-22 DIAGNOSIS — Z6838 Body mass index (BMI) 38.0-38.9, adult: Secondary | ICD-10-CM | POA: Insufficient documentation

## 2021-09-22 DIAGNOSIS — Z8673 Personal history of transient ischemic attack (TIA), and cerebral infarction without residual deficits: Secondary | ICD-10-CM | POA: Insufficient documentation

## 2021-09-22 DIAGNOSIS — I447 Left bundle-branch block, unspecified: Secondary | ICD-10-CM | POA: Insufficient documentation

## 2021-09-22 DIAGNOSIS — I2510 Atherosclerotic heart disease of native coronary artery without angina pectoris: Secondary | ICD-10-CM | POA: Insufficient documentation

## 2021-09-22 DIAGNOSIS — I252 Old myocardial infarction: Secondary | ICD-10-CM | POA: Diagnosis not present

## 2021-09-22 DIAGNOSIS — F209 Schizophrenia, unspecified: Secondary | ICD-10-CM | POA: Insufficient documentation

## 2021-09-22 LAB — ECHOCARDIOGRAM COMPLETE
AR max vel: 3.86 cm2
AV Peak grad: 6.8 mmHg
Ao pk vel: 1.3 m/s
Area-P 1/2: 2.36 cm2
Height: 71 in
S' Lateral: 5.3 cm
Weight: 4409.6 oz

## 2021-09-22 LAB — BASIC METABOLIC PANEL
BUN: 23 mg/dL (ref 6–23)
CO2: 30 mEq/L (ref 19–32)
Calcium: 9 mg/dL (ref 8.4–10.5)
Chloride: 110 mEq/L (ref 96–112)
Creatinine, Ser: 1.43 mg/dL (ref 0.40–1.50)
GFR: 56.09 mL/min — ABNORMAL LOW (ref >60.00)
Glucose, Bld: 136 mg/dL — ABNORMAL HIGH (ref 70–99)
Potassium: 4.9 mEq/L (ref 3.5–5.1)
Sodium: 143 mEq/L (ref 135–145)

## 2021-09-22 MED ORDER — PERFLUTREN LIPID MICROSPHERE
1.0000 mL | INTRAVENOUS | Status: DC | PRN
  Administered 2021-09-22: 2 mL via INTRAVENOUS
  Filled 2021-09-22: qty 10

## 2021-09-22 NOTE — Progress Notes (Signed)
ADVANCED HF CLINIC NOTE  PCP: [name], MD HF Cardiologist: Dr. [name]  HPI: Mitchell [name] is a 51 y.o.male with multiple medical problems HFrEF, mixed ischemic/nonischemic CM, 3v CAD, obesity, hypertension, type II DM, obstructive sleep apnea, bipolar disease/schizophrenia, prior CVA.    Had [facility] on 09/26/17 with 3v disease and preserved CO/CI. No revascularization options. Saw EP about ICD but did not f/u.   Had been lost to follow-up in HF clinic since 2020.  Patient admitted June 2022 with sepsis secondary to RLE abscess/cellulitis and COVID-19. He ended up requiring R BKA.  Readmitted 04/11/21 with a/c HFrEF and NSTEMI.  He was diuresed with IV lasix and started on GDMT. L/RHC with severe 3 vessel CAD (no targets for intervention), RA 4, PA 49/17 (31), PCW 15, Fick CO/index 6.7/2.7. He was referred to paramedicine program at discharge. Visual impairment felt to be contributor to issues with medication compliance.  Since his last hospitalization, we have been titrating GDMT.   Presents to clinic today for f/u. Echo done, EF 30-35%, RV ok. IVC not dilated.   He reports doing fairly well from functional standpoint, able to get around w/ prosthetic leg. NYHA Class II. Denies CP. His BP is elevated today but he did not take his meds this morning. He has had several doctors appts scheduled today and was running late this morning. Otherwise, he reports good med compliance and states he was graduated from [employer]. BP 162/98. Volume stable on exam. Wt down from previous hospital dry wt.   Also of note, he is being treated for simple UTI. Seen at urgent care last week, 2/1,6 for LB/flank pain. UA +. CT w/o evidence pyelonephritis. No kidney stone. Placed on Fontanet. Had f/u w/ his PCP today. She checked f/u UA and BMP. Results pending.    CARDIAC TESTING: R/LHC (9/22):   Prox LAD to Mid LAD lesion is 80% stenosed.   Dist LAD lesion is 99% stenosed.   Dist RCA lesion is 90%  stenosed.   Ost 1st Mrg lesion is 95% stenosed.   1st Mrg lesion is 99% stenosed.   Ost 2nd Mrg to 2nd Mrg lesion is 99% stenosed.   Ost RPDA to RPDA lesion is 90% stenosed.   RPAV lesion is 95% stenosed.   Mid LM lesion is 20% stenosed.  Findings: Ao = 131/84 (107) LV = 131/22 RA = 4 RV = 49/11 PA = 49/17 (31) PCW = 15 Fick cardiac output/index = 6.7/2.7 PVR = 2.4 FA sat = 95% PA sat = 56%, 63%  Assessment: Severe 3v diffuse CAD with moderate progression from previous Ischemic CM EF 25% Mild pulmonary venous HTN with preserved cardiac output   - Echo (6/22): EF 20-25%, mild LVH, trivial MR  ROS: All systems negative except as listed in HPI, PMH and Problem List.  SH:  Social History   Socioeconomic History   Marital status: Married    Spouse name: Not on file   Number of children: 5   Years of education: Not on file   Highest education level: Not on file  Occupational History   Occupation: Mortician  Tobacco Use   Smoking status: Never   Smokeless tobacco: Never  Vaping Use   Vaping Use: Never used  Substance and Sexual Activity   Alcohol use: No   Drug use: No   Sexual activity: Not on file  Other Topics Concern   Not on file  Social History Narrative   MARRIED, LIVES [place];  GREW UP IN [place] AND USED TO BE A COOK; HE ENJOYS COOKING AND ENJOYS EATING A LOT OF PORK AND SALT.   Social Determinants of Health   Financial Resource Strain: Medium Risk   Difficulty of Paying Living Expenses: Somewhat hard  Food Insecurity: No Food Insecurity   Worried About Charity fundraiser in the Last Year: Never true   Ran Out of Food in the Last Year: Never true  Transportation Needs: No Transportation Needs   Lack of Transportation (Medical): No   Lack of Transportation (Non-Medical): No  Physical Activity: Not on file  Stress: Not on file  Social Connections: Not on file  Intimate Partner Violence: Not on file    FH:  Family History   Problem Relation Age of Onset   Heart disease Mother        MI   Heart failure Mother    Diabetes Mother        ALSO IN MOST OF HIS SIBLINGS; 2 UNLCES HAVE ALSO PASSED AWAY FROM DM   Cardiomyopathy Mother    Cancer - Ovarian Mother    Ovarian cancer Mother    Heart disease Father    Hypertension Father    Diabetes Father    Diabetes Sister    Diabetes Brother    Colon cancer Paternal Uncle    Colon cancer Paternal Uncle    Colon polyps Neg Hx    Esophageal cancer Neg Hx    Rectal cancer Neg Hx    Stomach cancer Neg Hx     Past Medical History:  Diagnosis Date   Bipolar disorder ([place])    CAD (coronary artery disease)    a. diffuse 3v CAD by cath 2019, medical therapy recommended.   Cataract    forming    Chronic systolic CHF (congestive heart failure) (HCC)    CVA (cerebral infarction)    No residual deficits   Depression    PTSD,    Diabetes mellitus    TYPE II; UNCONTROLLED BY HEMOGLOBIN A1c; STABLE AS  PER DISCHARGE   Headache(784.0)    Herpes simplex of male genitalia    History of colonic polyps    Hyperlipidemia    Hypertension    Myocardial infarction ([place]) 1987   (while playing football)   Obesity    OSA (obstructive sleep apnea)    repeat study 2018 without significant OSA   Pneumonia    Post-cardiac injury syndrome ([place])    History of cardiac injury from blunt trauma   Pulmonary hypertension ([place])    a. moderately elevated PASP 07/2019.   PVCs (premature ventricular contractions)    Schizophrenia ([place])    Goes to [facility]   Sleep apnea    Stroke [facility]) 2005   some left side weakness   Syncope    Recurrent, thought to be vasovagal. Also has h/o frequent PVCs.     Current Outpatient Medications  Medication Sig Dispense Refill   amitriptyline (ELAVIL) 10 MG tablet TAKE 1 TABLET BY MOUTH AT BEDTIME 30 tablet 0   ascorbic acid (VITAMIN C) 250 MG tablet Take 1 tablet (250 mg total) by mouth daily. 30 tablet 0   aspirin 81 MG EC  tablet Take 1 tablet (81 mg total) by mouth daily. 30 tablet 12   carvedilol (COREG) 12.5 MG tablet Take 1.5 tablets (18.75 mg total) by mouth 2 (two) times daily with a meal. 90 tablet 5   cefdinir (OMNICEF) 300 MG capsule Take 1  capsule (300 mg total) by mouth 2 (two) times daily. 14 capsule 0   cholecalciferol (VITAMIN D3) 25 MCG (1000 UNIT) tablet Take 1,000 Units by mouth daily.     clopidogrel (PLAVIX) 75 MG tablet Take 1 tablet (75 mg total) by mouth daily. 30 tablet 3   dapagliflozin propanediol (FARXIGA) 10 MG TABS tablet Take 1 tablet (10 mg total) by mouth daily before breakfast. 30 tablet 11   gabapentin (NEURONTIN) 100 MG capsule Take 1 capsule by mouth at bedtime (Patient taking differently: daily as needed.) 30 capsule 0   hydrocerin (EUCERIN) CREA Apply 1 application topically 2 (two) times daily. To left foot/leg 454 g 0   Insulin Glargine (BASAGLAR KWIKPEN) 100 UNIT/ML Inject 21 Units into the skin 2 (two) times daily.     methocarbamol (ROBAXIN) 500 MG tablet TAKE 1 TABLET BY MOUTH EVERY 6 HOURS AS NEEDED FOR MUSCLE SPASM 60 tablet 0   oxyCODONE-acetaminophen (PERCOCET/ROXICET) 5-325 MG tablet Take 1 tablet by mouth every 4 (four) hours as needed for severe pain. 30 tablet 0   pantoprazole (PROTONIX) 40 MG tablet Take 1 tablet by mouth once daily 30 tablet 0   potassium chloride SA (KLOR-CON) 20 MEQ tablet Take 1 tablet with Torsemide. (Patient taking differently: as needed. Take 1 tablet with Torsemide.) 15 tablet 3   rosuvastatin (CRESTOR) 40 MG tablet Take 1 tablet (40 mg total) by mouth daily. 30 tablet 3   sacubitril-valsartan (ENTRESTO) 97-103 MG Take 1 tablet by mouth 2 (two) times daily. 60 tablet 11   spironolactone (ALDACTONE) 25 MG tablet Take 1 tablet (25 mg total) by mouth daily. 30 tablet 3   torsemide (DEMADEX) 20 MG tablet Take 1 tablet (20 mg total) by mouth as needed. 15 tablet 3   Continuous Blood Gluc Receiver (FREESTYLE LIBRE 2 READER) DEVI 1 each by Does not  apply route daily. (Patient not taking: Reported on 09/06/2021) 30 each 3   Continuous Blood Gluc Sensor (FREESTYLE LIBRE 3 SENSOR) MISC 1 each by Does not apply route daily. Place 1 sensor on the skin every 14 days. Use to check glucose continuously (Patient not taking: Reported on 09/06/2021) 2 each 3   Continuous Blood Gluc Transmit (DEXCOM G6 TRANSMITTER) MISC 1 each by Does not apply route daily. (Patient not taking: Reported on 09/06/2021) 3 each 3   No current facility-administered medications for this encounter.   Facility-Administered Medications Ordered in Other Encounters  Medication Dose Route Frequency Provider Last Rate Last Admin   perflutren lipid microspheres (DEFINITY) IV suspension  1-10 mL Intravenous PRN Rafael Bihari, FNP   2 mL at 09/22/21 1343   Wt Readings from Last 3 Encounters:  09/22/21 126.6 kg  09/22/21 125 kg  09/15/21 127 kg   BP (!) 162/98    Pulse 72    Wt 126.6 kg    SpO2 97%    BMI 38.91 kg/m   PHYSICAL EXAM: General:  Well appearing, moderately obese. No respiratory difficulty HEENT: normal Neck: supple. no JVD. Carotids 2+ bilat; no bruits. No lymphadenopathy or thyromegaly appreciated. Cor: PMI nondisplaced. Regular rate & rhythm. No rubs, gallops or murmurs. Lungs: clear Abdomen: soft, nontender, nondistended. No hepatosplenomegaly. No bruits or masses. Good bowel sounds. Extremities: no cyanosis, clubbing, rash, edema s/p rt BKA + prosthetic leg  Neuro: alert & oriented x 3, cranial nerves grossly intact. moves all 4 extremities w/o difficulty. Affect pleasant.   ASSESSMENT & PLAN:  Chronic systolic HF /Mixed ischemic/nonischemic cardiomyopathy  - Echo (  6/22): EF 20 to 25%, stable from prior exams dating back to 73  - R/LHC (9/22): Severe 3v CAD (no targets for revascularization), mild pulm venous HTN with preserved CO. - Admit 9/22 with a/c CHF. Compliance an issue. Now enrolled in paramedicine, appreciate their assistance. - Improved NYHA  II, functional status difficult with limited mobility after BKA. Volume status stable on exam.  - Echo today, EF 30-35%, RV normal. IVC not dilated  - w/ EF <35% despite optimal medical therapy, will refer to EP for ICD. He has LBBB but QRS <150 ms. Not candidate for CRT-D  - BP elevated but has not yet taken meds today   - Continue Coreg 18.75 mg bid. - Continue Farxiga 10 mg daily.  - Continue spiro 25 mg daily. - Continue Enstesto 97/103 mg bid. - Continue torsemide 20 mg prn. - BMET checked at PCP's office today. Will await results    2. CAD/NSTEMI 09/22 - Cath with progressive 3v CAD as above. No targets for intervention.  - Denies any angina. - Continue DAPT with aspirin + plavix, statin and beta blocker.    3. Hypertension - elevated today but has not yet taken meds - continue GDMT per above   4. Hyperlipidemia  - Continue statin, Crestor 40 mg  - Re-check lipids next visit.   5. Type 2 diabetes - Last A1c 7.8  - Continue Farxiga. - On insulin, management per PCP.   6. PVD s/p R BKA  - Diabetic foot wound complicated by sepsis that led to amputation on 01/25/2021 - Continue aspirin and statin.   7. Simple UTI - on abx therapy, PCP following - 1st occurrence. Recommend continuation of Farxiga as benefit > risk  Refer to EP for ICD. F/u w/ Dr. [name] in 2-3 months   Lyda Jester, PA-C  09/22/21

## 2021-09-22 NOTE — Progress Notes (Signed)
Echocardiogram 2D Echocardiogram has been performed.  Mitchell [name] 09/22/2021, 1:41 PM

## 2021-09-22 NOTE — Patient Instructions (Signed)
It was great to see you today! No medication changes are needed at this time.    You have been referred to [facility] -they will be in contact with an appointment   Do the following things EVERYDAY: Weigh yourself in the morning before breakfast. Write it down and keep it in a log. Take your medicines as prescribed Eat low salt foods--Limit salt (sodium) to 2000 mg per day.  Stay as active as you can everyday Limit all fluids for the day to less than 2 liters  At the [facility], you and your health needs are our priority. As part of our continuing mission to provide you with exceptional heart care, we have created designated Provider Care Teams. These Care Teams include your primary Cardiologist (physician) and Advanced Practice Providers (APPs- Physician Assistants and Nurse Practitioners) who all work together to provide you with the care you need, when you need it.   You may see any of the following providers on your designated Care Team at your next follow up: Dr Arvilla Meres Dr Carron Curie, NP Robbie Lis, [place] [facility] [place], [place] Karle Plumber, PharmD   Please be sure to bring in all your medications bottles to every appointment.   If you have any questions or concerns before your next appointment please send [place] a message through [place] or call our office at [phone].    TO LEAVE A MESSAGE FOR THE NURSE SELECT OPTION 2, PLEASE LEAVE A MESSAGE INCLUDING: YOUR NAME DATE OF BIRTH CALL BACK NUMBER REASON FOR CALL**this is important as we prioritize the call backs  YOU WILL RECEIVE A CALL BACK THE SAME DAY AS LONG AS YOU CALL BEFORE 4:00 PM

## 2021-09-22 NOTE — Patient Instructions (Addendum)
It was good to see you again today We will check on your urine and your blood work today and will be in touch with your results

## 2021-09-23 ENCOUNTER — Encounter (HOSPITAL_BASED_OUTPATIENT_CLINIC_OR_DEPARTMENT_OTHER): Payer: Self-pay | Admitting: Emergency Medicine

## 2021-09-23 ENCOUNTER — Other Ambulatory Visit: Payer: Self-pay

## 2021-09-23 ENCOUNTER — Emergency Department (HOSPITAL_BASED_OUTPATIENT_CLINIC_OR_DEPARTMENT_OTHER)
Admission: EM | Admit: 2021-09-23 | Discharge: 2021-09-24 | Disposition: A | Discharge status: Home / Self Care | Payer: No Typology Code available for payment source | Attending: Emergency Medicine | Admitting: Emergency Medicine

## 2021-09-23 DIAGNOSIS — R109 Unspecified abdominal pain: Secondary | ICD-10-CM

## 2021-09-23 DIAGNOSIS — Z7982 Long term (current) use of aspirin: Secondary | ICD-10-CM | POA: Diagnosis not present

## 2021-09-23 DIAGNOSIS — Z79899 Other long term (current) drug therapy: Secondary | ICD-10-CM | POA: Insufficient documentation

## 2021-09-23 DIAGNOSIS — R1032 Left lower quadrant pain: Secondary | ICD-10-CM | POA: Diagnosis not present

## 2021-09-23 DIAGNOSIS — Z794 Long term (current) use of insulin: Secondary | ICD-10-CM | POA: Insufficient documentation

## 2021-09-23 DIAGNOSIS — N390 Urinary tract infection, site not specified: Secondary | ICD-10-CM | POA: Diagnosis not present

## 2021-09-23 LAB — URINALYSIS, ROUTINE W REFLEX MICROSCOPIC
Bilirubin Urine: NEGATIVE
Glucose, UA: NEGATIVE mg/dL
Ketones, ur: NEGATIVE mg/dL
Leukocytes,Ua: NEGATIVE
Nitrite: NEGATIVE
Protein, ur: 30 mg/dL — AB
Specific Gravity, Urine: 1.017 (ref 1.005–1.030)
pH: 5.5 (ref 5.0–8.0)

## 2021-09-23 MED ORDER — DICLOFENAC SODIUM 1 % EX GEL: 4.0000 g | Freq: Four times a day (QID) | CUTANEOUS | 0 refills | Status: DC

## 2021-09-23 NOTE — Discharge Instructions (Signed)
I think it is unlikely that you have a continued urinary tract infection.  Please follow-up with your family doctor in the office.  I suspect more likely you have a muscular strain.  Use the gel as prescribed. Also take tylenol 1000mg (2 extra strength) four times a day.

## 2021-09-23 NOTE — ED Triage Notes (Signed)
Left flank pain x 1 week. Diagnosed with UTI last week and just finished antibiotics today. No urinary complaints.

## 2021-09-23 NOTE — ED Notes (Signed)
Pt verbalizes understanding of discharge instructions. Opportunity for questioning and answers were provided. Pt discharged from ED to home.   ? ?

## 2021-09-24 LAB — URINE CULTURE
MICRO NUMBER:: 13048534
SPECIMEN QUALITY:: ADEQUATE

## 2021-09-24 MED ORDER — SULFAMETHOXAZOLE-TRIMETHOPRIM 800-160 MG PO TABS: 1.0000 | ORAL_TABLET | Freq: Two times a day (BID) | ORAL | 0 refills | Status: DC

## 2021-09-24 NOTE — Addendum Note (Signed)
Addended by: Abbe Amsterdam C on: 09/24/2021 02:12 PM   Modules accepted: Orders

## 2021-09-24 NOTE — ED Provider Notes (Signed)
[facility] DEPT Provider Note   CSN: [id number] Arrival date & time: 09/23/21  2236     History  Chief Complaint  Patient presents with   Flank Pain    Mitchell [name] is a 51 y.o. male.  50 yoM with a chief complaints of left-sided abdominal discomfort.  This has been going on for about a week now.  Been seen in the emergency department and was diagnosed with a urinary tract infection.  Has just finished his antibiotics.  Seen his family doctor for this yesterday and they sent off a urine culture and a metabolic panel.  He denies any fevers.  Denies any urinary symptoms.  Pain is left-sided and pinpoint.  Seems to be worse with sitting in certain positions.  He denies trauma to the area denies loss of bowel or bladder denies loss of perirectal sensation denies numbness or weakness to the leg.   Flank Pain      Home Medications Prior to Admission medications   Medication Sig Start Date End Date Taking? Authorizing Provider  diclofenac Sodium (VOLTAREN) 1 % GEL Apply 4 g topically 4 (four) times daily. 09/23/21  Yes [name], DO  amitriptyline (ELAVIL) 10 MG tablet TAKE 1 TABLET BY MOUTH AT BEDTIME 09/06/21   [name], [name], MD  ascorbic acid (VITAMIN C) 250 MG tablet Take 1 tablet (250 mg total) by mouth daily. 02/18/21   Love, Ivan Anchors, PA-C  aspirin 81 MG EC tablet Take 1 tablet (81 mg total) by mouth daily. 04/13/21   Danford, Suann Larry, MD  carvedilol (COREG) 12.5 MG tablet Take 1.5 tablets (18.75 mg total) by mouth 2 (two) times daily with a meal. 06/01/21   Milford, Maricela Bo, FNP  cefdinir (OMNICEF) 300 MG capsule Take 1 capsule (300 mg total) by mouth 2 (two) times daily. 09/15/21   Lacretia Leigh, MD  cholecalciferol (VITAMIN D3) 25 MCG (1000 UNIT) tablet Take 1,000 Units by mouth daily.    [provider]  clopidogrel (PLAVIX) 75 MG tablet Take 1 tablet (75 mg total) by mouth daily. 04/20/21   Milford, Maricela Bo, FNP  Continuous Blood  Gluc Receiver (FREESTYLE LIBRE 2 READER) DEVI 1 each by Does not apply route daily. Patient not taking: Reported on 09/06/2021 07/25/21   Izora Ribas, MD  Continuous Blood Gluc Sensor (FREESTYLE LIBRE 3 SENSOR) MISC 1 each by Does not apply route daily. Place 1 sensor on the skin every 14 days. Use to check glucose continuously Patient not taking: Reported on 09/06/2021 07/25/21   Izora Ribas, MD  Continuous Blood Gluc Transmit (DEXCOM G6 TRANSMITTER) MISC 1 each by Does not apply route daily. Patient not taking: Reported on 09/06/2021 07/15/21   Izora Ribas, MD  dapagliflozin propanediol (FARXIGA) 10 MG TABS tablet Take 1 tablet (10 mg total) by mouth daily before breakfast. 05/04/21   Milford, Maricela Bo, FNP  gabapentin (NEURONTIN) 100 MG capsule Take 1 capsule by mouth at bedtime Patient taking differently: daily as needed. 07/28/21   [name], MD  hydrocerin (EUCERIN) CREA Apply 1 application topically 2 (two) times daily. To left foot/leg 02/17/21   Love, Ivan Anchors, PA-C  Insulin Glargine [facility]) 100 UNIT/ML Inject 21 Units into the skin 2 (two) times daily.    [provider]  methocarbamol (ROBAXIN) 500 MG tablet TAKE 1 TABLET BY MOUTH EVERY 6 HOURS AS NEEDED FOR MUSCLE SPASM 08/03/21   [name], [name], MD  oxyCODONE-acetaminophen (PERCOCET/ROXICET) 5-325 MG  tablet Take 1 tablet by mouth every 4 (four) hours as needed for severe pain. 09/02/21   Nadara Mustard, MD  pantoprazole (PROTONIX) 40 MG tablet Take 1 tablet by mouth once daily 08/03/21   [name], Drema Pry, MD  potassium chloride SA (KLOR-CON) 20 MEQ tablet Take 1 tablet with Torsemide. Patient taking differently: as needed. Take 1 tablet with Torsemide. 04/21/21   Andrey Farmer, PA-C  rosuvastatin (CRESTOR) 40 MG tablet Take 1 tablet (40 mg total) by mouth daily. 04/20/21   Milford, Anderson Malta, FNP  sacubitril-valsartan (ENTRESTO) 97-103 MG Take 1 tablet by mouth 2 (two) times daily. 05/04/21    Jacklynn Ganong, FNP  spironolactone (ALDACTONE) 25 MG tablet Take 1 tablet (25 mg total) by mouth daily. 04/20/21   Jacklynn Ganong, FNP  torsemide (DEMADEX) 20 MG tablet Take 1 tablet (20 mg total) by mouth as needed. 04/21/21   Andrey Farmer, PA-C      Allergies    Nsaids and Trulicity [dulaglutide]    Review of Systems   Review of Systems  Genitourinary:  Positive for flank pain.   Physical Exam Updated Vital Signs BP (!) 160/86    Pulse 72    Temp 97.9 F (36.6 C) (Oral)    Resp 18    Ht 5\' 11"  (1.803 m)    Wt 126.5 kg    SpO2 98%    BMI 38.90 kg/m  Physical Exam Vitals and nursing note reviewed.  Constitutional:      Appearance: He is well-developed.     Comments: BMI 39  HENT:     Head: Normocephalic and atraumatic.  Eyes:     Pupils: Pupils are equal, round, and reactive to light.  Neck:     Vascular: No JVD.  Cardiovascular:     Rate and Rhythm: Normal rate and regular rhythm.     Heart sounds: No murmur heard.   No friction rub. No gallop.  Pulmonary:     Effort: No respiratory distress.     Breath sounds: No wheezing.  Abdominal:     General: There is no distension.     Tenderness: There is no abdominal tenderness. There is no guarding or rebound.     Comments: Pain is along the anterior axillary line about one third of the way from the costal margin to the iliac crest.  No significant pain in the left lower quadrant.  Benign abdominal exam otherwise.  No midline spinal tenderness.  No CVA tenderness.  Musculoskeletal:        General: Normal range of motion.     Cervical back: Normal range of motion and neck supple.  Skin:    Coloration: Skin is not pale.     Findings: No rash.  Neurological:     Mental Status: He is alert and oriented to person, place, and time.  Psychiatric:        Behavior: Behavior normal.    ED Results / Procedures / Treatments   Labs (all labs ordered are listed, but only abnormal results are displayed) Labs Reviewed   URINALYSIS, ROUTINE W REFLEX MICROSCOPIC - Abnormal; Notable for the following components:      Result Value   Color, Urine COLORLESS (*)    Hgb urine dipstick SMALL (*)    Protein, ur 30 (*)    All other components within normal limits    EKG None  Radiology ECHOCARDIOGRAM COMPLETE  Result Date: 09/22/2021    ECHOCARDIOGRAM REPORT  Patient Name:   KIELAN FISK Date of Exam: 09/22/2021 Medical Rec #:  [id number]       Height:       71.0 in Accession #:    [id number]      Weight:       275.6 lb Date of Birth:  [birth year]        BSA:          2.416 m Patient Age:    50 years        BP:           132/80 mmHg Patient Gender: M               HR:           75 bpm. Exam Location:  Outpatient Procedure: 2D Echo and Intracardiac Opacification Agent Indications:    Chronic systolic heart failure  History:        Patient has prior history of Echocardiogram examinations, most                 recent 01/24/2021. Risk Factors:Diabetes, Hypertension and Sleep                 Apnea.  Sonographer:    Jefferey Pica Referring Phys: [place]  1. Left ventricular ejection fraction, by estimation, is 30 to 35%. The left ventricle has moderately decreased function. The left ventricle has no regional wall motion abnormalities. Left ventricular diastolic parameters are consistent with Grade II diastolic dysfunction (pseudonormalization). There is akinesis of the left ventricular, mid-apical anteroseptal wall, anterior wall, apical segment and inferoseptal wall.  2. Right ventricular systolic function is normal. The right ventricular size is normal.  3. Left atrial size was moderately dilated.  4. Right atrial size was moderately dilated.  5. The mitral valve is normal in structure. Trivial mitral valve regurgitation. No evidence of mitral stenosis.  6. The aortic valve is normal in structure. Aortic valve regurgitation is not visualized. No aortic stenosis is present.  7. The inferior vena cava  is normal in size with greater than 50% respiratory variability, suggesting right atrial pressure of 3 mmHg. FINDINGS  Left Ventricle: Left ventricular ejection fraction, by estimation, is 30 to 35%. The left ventricle has moderately decreased function. The left ventricle has no regional wall motion abnormalities. The left ventricular internal cavity size was normal in size. There is no left ventricular hypertrophy. Left ventricular diastolic parameters are consistent with Grade II diastolic dysfunction (pseudonormalization). Right Ventricle: The right ventricular size is normal. No increase in right ventricular wall thickness. Right ventricular systolic function is normal. Left Atrium: Left atrial size was moderately dilated. Right Atrium: Right atrial size was moderately dilated. Pericardium: There is no evidence of pericardial effusion. Mitral Valve: The mitral valve is normal in structure. Trivial mitral valve regurgitation. No evidence of mitral valve stenosis. Tricuspid Valve: The tricuspid valve is normal in structure. Tricuspid valve regurgitation is trivial. No evidence of tricuspid stenosis. Aortic Valve: The aortic valve is normal in structure. Aortic valve regurgitation is not visualized. No aortic stenosis is present. Aortic valve peak gradient measures 6.8 mmHg. Pulmonic Valve: The pulmonic valve was grossly normal. Pulmonic valve regurgitation is trivial. No evidence of pulmonic stenosis. Aorta: The aortic root is normal in size and structure. Venous: The inferior vena cava is normal in size with greater than 50% respiratory variability, suggesting right atrial pressure of 3 mmHg. IAS/Shunts: No atrial level shunt detected by color flow Doppler.  LEFT VENTRICLE PLAX 2D LVIDd:         6.90 cm   Diastology LVIDs:         5.30 cm   LV e' medial:    4.47 cm/s LV PW:         1.10 cm   LV E/e' medial:  16.3 LV IVS:        1.10 cm   LV e' lateral:   3.30 cm/s LVOT diam:     2.20 cm   LV E/e' lateral: 22.1 LV  SV:         98 LV SV Index:   41 LVOT Area:     3.80 cm  RIGHT VENTRICLE             IVC RV S prime:     13.40 cm/s  IVC diam: 1.60 cm TAPSE (M-mode): 2.2 cm LEFT ATRIUM             Index        RIGHT ATRIUM           Index LA diam:        4.80 cm 1.99 cm/m   RA Area:     18.70 cm LA Vol (A2C):   43.8 ml 18.13 ml/m  RA Volume:   49.80 ml  20.61 ml/m LA Vol (A4C):   73.3 ml 30.33 ml/m LA Biplane Vol: 57.7 ml 23.88 ml/m  AORTIC VALVE                 PULMONIC VALVE AV Area (Vmax): 3.86 cm     PV Vmax:       0.92 m/s AV Vmax:        130.00 cm/s  PV Peak grad:  3.4 mmHg AV Peak Grad:   6.8 mmHg LVOT Vmax:      132.00 cm/s LVOT Vmean:     87.600 cm/s LVOT VTI:       0.258 m  AORTA Ao Root diam: 3.40 cm Ao Asc diam:  3.40 cm MITRAL VALVE MV Area (PHT): 2.36 cm    SHUNTS MV Decel Time: 322 msec    Systemic VTI:  0.26 m MV E velocity: 72.80 cm/s  Systemic Diam: 2.20 cm MV A velocity: 71.70 cm/s MV E/A ratio:  1.02 Glori Bickers MD Electronically signed by Glori Bickers MD Signature Date/Time: 09/22/2021/2:34:35 PM    Final     Procedures Procedures    Medications Ordered in ED Medications - No data to display  ED Course/ Medical Decision Making/ A&P                           Medical Decision Making Amount and/or Complexity of Data Reviewed Labs: ordered.   51 yo M with a chief complaints of left flank pain.  This been going on for about a week now.  Was seen in the emergency department and was diagnosed with a urinary tract infection with ongoing symptoms and seen his family doctor yesterday who sent off a urine culture and a metabolic panel.  On my review of his record he has had no change in his renal function.  No significant electrolyte abnormality.  UA repeated today without obvious infection.  On my review of his previous ED visits I am also not convinced that his UA previously was indicative of a urinary tract infection.  Most likely the patient's pain is musculoskeletal by history and  physical.  He had  a CT scan done at his last ED visit with was negative for intra-abdominal pathology or kidney stone.  There is no blood in his urine today.  I will have him follow-up with his family doctor we will treat as musculoskeletal back pain.  He has no red flags.  12:51 AM:  I have discussed the diagnosis/risks/treatment options with the patient.  Evaluation and diagnostic testing in the emergency department does not suggest an emergent condition requiring admission or immediate intervention beyond what has been performed at this time.  They will follow up with  PCP. We also discussed returning to the ED immediately if new or worsening sx occur. We discussed the sx which are most concerning (e.g., sudden worsening pain, fever, inability to tolerate by mouth, cauda equina s/sx) that necessitate immediate return. Medications administered to the patient during their visit and any new prescriptions provided to the patient are listed below.  Medications given during this visit Medications - No data to display   The patient appears reasonably screen and/or stabilized for discharge and I doubt any other medical condition or other [facility] requiring further screening, evaluation, or treatment in the ED at this time prior to discharge.          Final Clinical Impression(s) / ED Diagnoses Final diagnoses:  Flank pain    Rx / DC Orders ED Discharge Orders          Ordered    diclofenac Sodium (VOLTAREN) 1 % GEL  4 times daily        09/23/21 [place], [place], DO 09/24/21 [phone]

## 2021-09-29 ENCOUNTER — Other Ambulatory Visit (HOSPITAL_COMMUNITY): Payer: Self-pay | Admitting: Family Medicine

## 2021-09-29 ENCOUNTER — Telehealth: Payer: Self-pay | Admitting: Orthopedic Surgery

## 2021-09-29 ENCOUNTER — Other Ambulatory Visit: Payer: Self-pay | Admitting: Orthopedic Surgery

## 2021-09-29 MED ORDER — OXYCODONE-ACETAMINOPHEN 5-325 MG PO TABS: 1.0000 | ORAL_TABLET | Freq: Three times a day (TID) | ORAL | 0 refills | Status: DC | PRN

## 2021-09-29 NOTE — Telephone Encounter (Signed)
Pt has also been discharged from PT due to goals being met on 07/28/21 ?

## 2021-09-29 NOTE — Telephone Encounter (Signed)
S/p right bka 01/25/21 Oxycodone last filled 09/02/21 #30 ?

## 2021-09-29 NOTE — Telephone Encounter (Signed)
Pt informed

## 2021-09-29 NOTE — Telephone Encounter (Signed)
Pt asking for a refill on his oxycodone prescription. The best pharmacy is [facility] [address], [place] - [address]. Pt asked for a call this afternoon once it has been sent in so he can go pick it up at [phone] ?

## 2021-10-03 ENCOUNTER — Ambulatory Visit (INDEPENDENT_AMBULATORY_CARE_PROVIDER_SITE_OTHER): Payer: No Typology Code available for payment source | Admitting: Orthopedic Surgery

## 2021-10-03 ENCOUNTER — Encounter: Payer: Self-pay | Admitting: Orthopedic Surgery

## 2021-10-03 ENCOUNTER — Other Ambulatory Visit (HOSPITAL_COMMUNITY): Payer: Self-pay | Admitting: Family Medicine

## 2021-10-03 DIAGNOSIS — Z89511 Acquired absence of right leg below knee: Secondary | ICD-10-CM

## 2021-10-03 DIAGNOSIS — S91105A Unspecified open wound of left lesser toe(s) without damage to nail, initial encounter: Secondary | ICD-10-CM
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
## 2021-10-06 ENCOUNTER — Ambulatory Visit: Payer: No Typology Code available for payment source | Admitting: Family Medicine

## 2021-10-10 ENCOUNTER — Ambulatory Visit (INDEPENDENT_AMBULATORY_CARE_PROVIDER_SITE_OTHER): Payer: No Typology Code available for payment source | Admitting: Psychologist

## 2021-10-10 DIAGNOSIS — F321 Major depressive disorder, single episode, moderate: Secondary | ICD-10-CM | POA: Diagnosis not present

## 2021-10-10 NOTE — Progress Notes (Signed)
[facility] Counselor/Therapist Progress Note ? ?Patient ID: Mitchell [name], MRN: [id number],   ? ?Date: 10/10/2021 ? ?Time Spent: 3:04 pm to 3:20 pm; total time: 16 minutes ? ?This session was held via video webex teletherapy due to the coronavirus risk at this time. The patient consented to video teletherapy and was located in a physician's office waiting to see a doctor during this session. He is aware it is the responsibility of the patient to secure confidentiality on his end of the session. The provider was in a private home office for the duration of this session. Limits of confidentiality were discussed with the patient.  ? ?Treatment Type: Individual Therapy ? ?Reported Symptoms: Described himself as doing well  ? ?Mental Status Exam: ?Appearance:  Neat     ?Behavior: Appropriate  ?Motor: Normal  ?Speech/Language:  Normal Rate  ?Affect: Appropriate  ?Mood: normal  ?Thought process: normal  ?Thought content:   WNL  ?Sensory/Perceptual disturbances:   WNL  ?Orientation: oriented to person, place, time/date, and situation  ?Attention: Good  ?Concentration: Fair  ?Memory: WNL  ?Fund of knowledge:  Fair  ?Insight:   Fair  ?Judgment:  Fair  ?Impulse Control: Good  ? ?Risk Assessment: ?Danger to Self:  No ?Self-injurious Behavior: No ?Danger to Others: No ?Duty to Warn:no ?Physical Aggression / Violence:No  ?Access to Firearms a concern: No  ?Gang Involvement:No  ? ?Subjective: Beginning the session, patient described himself as well and indicated that the surgery to correct his eyesight has been tremendously helpful. He also disclosed that he has been working, which has helped him. Per the patient, he is not experiencing the same distress he once was. He also voiced less conflict at home. He explored what has helped him. He processed thoughts and emotions experienced. He terminated services. He denied suicidal and homicidal ideation.   ? ?Interventions:  Worked on developing a therapeutic  relationship with the patient using active listening and reflective statements. Provided emotional support using empathy and validation. Praised patient for doing well. Explored what has assisted the patient. Validated the significance of eye sight. Reflected on events since the last session. Praised patient for being able to work. Identified the theme of socialization as being important to the patient. Explored whether patient was experiencing challenges at home. Processed potential barriers and how to overcome those barriers. Reflected on patient's growth in counseling. Discussed next steps for counseling. Provided empathic statements. Assessed for suicidal and homicidal ideation.  ? ?Homework: NA ? ?Next Session: NA. Patient terminated services ? ?Diagnosis: F32.1 major depressive affective disorder, single episode, moderate ? ?Plan: Client Abilities: Patient is friendly and easy to develop rapport ? ?Client Preferences: Patient voiced interest in ACT and CBT ? ?Client statement of Needs:  Process emotions ? ?Treatment Level: Outpatient ? ?Symptoms: Feeling down, sad, tearful, low self-esteem, rumination of negative thoughts, thoughts of hopelessness, and irritability. He denied suicidal and homicidal ideation.  ? ?Goals:    ?Accept emotional support from others around them ?Live life to the fullest, event though time may be limited ?Become as knowledgeable about the medical condition  ?Reduce fear, anxiety about the health condition  ?Accept the illness ?Accept the role of psychological and behavioral factors  ? ? ? ? ?Objectives: Target date for all objectives: 06/16/2022 ?Identify feelings associated with the illness ?Family members share with each other feelings ?Identify the losses or limitations that have been experienced ?Verbalize acceptance of the reality of the medical condition ?Commit to learning and implement a  proactive approach to managing personal stresses ?Verbalize an understanding of the  medical condition ?Work with therapist to develop a plan for coping with stress ?Learn and implement skills for managing stress ?Engage in social, productive activities that are possible ?Engage in faith based activities ?implement positive imagery ?Identify coping skills and sources of emotional support ?Patient's partner and family members verbalize their fears regarding severity of health condition ?Identify sources of emotional distress  ?Learning and implement calming skills to reduce overall anxiety ?Learn and implement problem solving strategies ?Identify and engage in pleasant activities ?Learning and implement personal and interpersonal skills to reduce anxiety and improve interpersonal relationships ?Learn to accept limitations in life and commit to tolerating, rather than avoiding, unpleasant emotions while accomplishing meaningful goals ?Identify major life conflicts from the past and present that form the basis for present anxiety ?Learn and implement behavioral strategies ?Verbalize an understanding and resolution of current interpersonal problems ?Learn and implement problem solving and decision making skills ?Learn and implement conflict resolution skills to resolve interpersonal problems ?Verbalize an understanding of healthy and unhealthy emotions verbalize insight into how past relationships may be influence current experiences with depression ?Use mindfulness and acceptance strategies and increase value based behavior  ?Increase hopeful statements about the future.  ? ? ?Interventions ?Teach about stress and ways to handle stress ?Assist the patient in developing a coping action plan for stressors ?Conduct skills based training for coping strategies ?Train problem focused skills ?Sort out what activities the individual can do ?Encourage patient to rely upon his/her spiritual faith ?Teach the patient to use guided imagery ?Probe and evaluate family's ability to provide emotional support ?Allow  family to share their fears ?Assist the patient in identifying, sorting through, and verbalizing the various feelings generated by his/her medical condition ?Meet with family members  ?Ask patient list out limitations  ?Use stress inoculation training  ?Use Acceptance and Commitment Therapy to help client accept uncomfortable realities in order to accomplish value-consistent goals ?Reinforce the client's insight into the role of his/her past emotional pain and present anxiety  ?Discuss examples demonstrating that unrealistic worry overestimates the probability of threats and underestimates patient's ability  ?Assist the patient in analyzing his or her worries ?Help patient understand that avoidance is reinforcing  ?Behavioral activation help the client explore the relationship, nature of the dispute,  ?Help the client develop new interpersonal skills and relationships ?Conduct Problem so living therapy ?Teach conflict resolution skills ?Use a process-experiential approach ?Conduct TLDP ?Conduct ACT ? ?Patient and clinician reviewed treatment plan on 06/21/2021. The patient approved of the treatment plan.  ? ? ? ?Hilbert Corrigan, PsyD ? ?

## 2021-10-12 ENCOUNTER — Other Ambulatory Visit: Payer: Self-pay

## 2021-10-12 ENCOUNTER — Emergency Department (HOSPITAL_BASED_OUTPATIENT_CLINIC_OR_DEPARTMENT_OTHER)
Admission: EM | Admit: 2021-10-12 | Discharge: 2021-10-12 | Disposition: A | Discharge status: Home / Self Care | Payer: No Typology Code available for payment source | Attending: Emergency Medicine | Admitting: Emergency Medicine

## 2021-10-12 ENCOUNTER — Encounter (HOSPITAL_BASED_OUTPATIENT_CLINIC_OR_DEPARTMENT_OTHER): Payer: Self-pay

## 2021-10-12 DIAGNOSIS — R531 Weakness: Secondary | ICD-10-CM | POA: Diagnosis not present

## 2021-10-12 DIAGNOSIS — I110 Hypertensive heart disease with heart failure: Secondary | ICD-10-CM | POA: Insufficient documentation

## 2021-10-12 DIAGNOSIS — I2510 Atherosclerotic heart disease of native coronary artery without angina pectoris: Secondary | ICD-10-CM | POA: Insufficient documentation

## 2021-10-12 DIAGNOSIS — E119 Type 2 diabetes mellitus without complications: Secondary | ICD-10-CM | POA: Diagnosis not present

## 2021-10-12 DIAGNOSIS — I5022 Chronic systolic (congestive) heart failure: Secondary | ICD-10-CM | POA: Diagnosis not present

## 2021-10-12 DIAGNOSIS — R61 Generalized hyperhidrosis: Secondary | ICD-10-CM | POA: Insufficient documentation

## 2021-10-12 DIAGNOSIS — R5383 Other fatigue: Secondary | ICD-10-CM | POA: Diagnosis not present

## 2021-10-12 LAB — CBC WITH DIFFERENTIAL/PLATELET
Abs Immature Granulocytes: 0.02 10*3/uL (ref 0.00–0.07)
Basophils Absolute: 0 10*3/uL (ref 0.0–0.1)
Basophils Relative: 0 %
Eosinophils Absolute: 0.1 10*3/uL (ref 0.0–0.5)
Eosinophils Relative: 1 %
HCT: 38.3 % — ABNORMAL LOW (ref 39.0–52.0)
Hemoglobin: 12.4 g/dL — ABNORMAL LOW (ref 13.0–17.0)
Immature Granulocytes: 0 %
Lymphocytes Relative: 16 %
Lymphs Abs: 1.3 10*3/uL (ref 0.7–4.0)
MCH: 29 pg (ref 26.0–34.0)
MCHC: 32.4 g/dL (ref 30.0–36.0)
MCV: 89.7 fL (ref 80.0–100.0)
Monocytes Absolute: 0.5 10*3/uL (ref 0.1–1.0)
Monocytes Relative: 7 %
Neutro Abs: 5.9 10*3/uL (ref 1.7–7.7)
Neutrophils Relative %: 76 %
Platelets: 132 10*3/uL — ABNORMAL LOW (ref 150–400)
RBC: 4.27 MIL/uL (ref 4.22–5.81)
RDW: 12.5 % (ref 11.5–15.5)
WBC: 7.8 10*3/uL (ref 4.0–10.5)
nRBC: 0 % (ref 0.0–0.2)

## 2021-10-12 LAB — CBG MONITORING, ED: Glucose-Capillary: 163 mg/dL — ABNORMAL HIGH (ref 70–99)

## 2021-10-12 LAB — BASIC METABOLIC PANEL
Anion gap: 8 (ref 5–15)
BUN: 22 mg/dL — ABNORMAL HIGH (ref 6–20)
CO2: 23 mmol/L (ref 22–32)
Calcium: 9.1 mg/dL (ref 8.9–10.3)
Chloride: 109 mmol/L (ref 98–111)
Creatinine, Ser: 1.26 mg/dL — ABNORMAL HIGH (ref 0.61–1.24)
GFR, Estimated: >60 mL/min (ref >60)
Glucose, Bld: 168 mg/dL — ABNORMAL HIGH (ref 70–99)
Potassium: 3.9 mmol/L (ref 3.5–5.1)
Sodium: 140 mmol/L (ref 135–145)

## 2021-10-12 LAB — TROPONIN I (HIGH SENSITIVITY): Troponin I (High Sensitivity): 14 ng/L (ref <18)

## 2021-10-12 NOTE — ED Provider Notes (Signed)
? ?Emergency Department Provider Note ? ? ?I have reviewed the triage vital signs and the nursing notes. ? ? ?HISTORY ? ?Chief Complaint ?No chief complaint on file. ? ? ?HPI ?Mitchell [name] is a 51 y.o. male with past medical history reviewed below presents emergency department for evaluation of brief episode of fatigue with some diaphoresis.  Patient was at work and the episode was noticed by his coworkers who ultimately called EMS.  Patient states he was feeling okay and symptoms have since resolved.  He is not having any chest pain or pressure.  No heart palpitations or shortness of breath.  No abdominal pain, nausea, diarrhea type symptoms.  He notes similar feeling with low blood sugar in the past.  EMS arrived and found his blood sugar to be slightly elevated.  He was ultimately transported to the emergency department.  He states he is feeling well at this time.  He did take his home medications and did eat breakfast.  ? ? ?Past Medical History:  ?Diagnosis Date  ? Bipolar disorder (HCC)   ? CAD (coronary artery disease)   ? a. diffuse 3v CAD by cath 2019, medical therapy recommended.  ? Cataract   ? forming   ? Chronic systolic CHF (congestive heart failure) (HCC)   ? CVA (cerebral infarction)   ? No residual deficits  ? Depression   ? PTSD,   ? Diabetes mellitus   ? TYPE II; UNCONTROLLED BY HEMOGLOBIN A1c; STABLE AS  PER DISCHARGE  ? Headache(784.0)   ? Herpes simplex of male genitalia   ? History of colonic polyps   ? Hyperlipidemia   ? Hypertension   ? Myocardial infarction [facility]) 1987  ? (while playing football)  ? Obesity   ? OSA (obstructive sleep apnea)   ? repeat study 2018 without significant OSA  ? Pneumonia   ? Post-cardiac injury syndrome (HCC)   ? History of cardiac injury from blunt trauma  ? Pulmonary hypertension (HCC)   ? a. moderately elevated PASP 07/2019.  ? PVCs (premature ventricular contractions)   ? Schizophrenia (HCC)   ? Goes to [facility]  ? Sleep apnea   ?  Stroke [facility]) 2005  ? some left side weakness  ? Syncope   ? Recurrent, thought to be vasovagal. Also has h/o frequent PVCs.   ? ? ?Review of Systems ? ?Constitutional: No fever/chills. Positive weakness and diaphoresis.  ?Eyes: No visual changes. ?ENT: No sore throat. ?Cardiovascular: Denies chest pain. ?Respiratory: Denies shortness of breath. ?Gastrointestinal: No abdominal pain.  No nausea, no vomiting.  No diarrhea.  No constipation. ?Genitourinary: Negative for dysuria. ?Musculoskeletal: Negative for back pain. ?Skin: Negative for rash. ?Neurological: Negative for headaches. ? ?____________________________________________ ? ? ?PHYSICAL EXAM: ? ?VITAL SIGNS: ?ED Triage Vitals  ?Enc Vitals Group  ?   BP 10/12/21 0911 (!) 159/97  ?   Pulse Rate 10/12/21 0911 73  ?   Resp 10/12/21 0911 18  ?   Temp 10/12/21 0911 98.7 ?F (37.1 ?C)  ?   Temp Source 10/12/21 0911 Oral  ?   SpO2 10/12/21 0905 98 %  ?   Weight 10/12/21 0911 278 lb 14.1 oz (126.5 kg)  ?   Height 10/12/21 0911 5\' 11"  (1.803 m)  ? ? ?Constitutional: Alert and oriented. Well appearing and in no acute distress. ?Eyes: Conjunctivae are normal.  ?Head: Atraumatic. ?Nose: No congestion/rhinnorhea. ?Mouth/Throat: Mucous membranes are moist.   ?Neck: No stridor.   ?Cardiovascular: Normal rate, regular  rhythm. Good peripheral circulation. Grossly normal heart sounds.   ?Respiratory: Normal respiratory effort.  No retractions. Lungs CTAB. ?Gastrointestinal: Soft and nontender. No distention.  ?Musculoskeletal: No gross deformities of extremities. ?Neurologic:  Normal speech and language.  ?Skin:  Skin is warm, dry and intact. No rash noted. ? ?____________________________________________ ?  ?LABS ?(all labs ordered are listed, but only abnormal results are displayed) ? ?Labs Reviewed  ?BASIC METABOLIC PANEL - Abnormal; Notable for the following components:  ?    Result Value  ? Glucose, Bld 168 (*)   ? BUN 22 (*)   ? Creatinine, Ser 1.26 (*)   ? All other  components within normal limits  ?CBC WITH DIFFERENTIAL/PLATELET - Abnormal; Notable for the following components:  ? Hemoglobin 12.4 (*)   ? HCT 38.3 (*)   ? Platelets 132 (*)   ? All other components within normal limits  ?CBG MONITORING, ED - Abnormal; Notable for the following components:  ? Glucose-Capillary 163 (*)   ? All other components within normal limits  ?TROPONIN I (HIGH SENSITIVITY)  ?TROPONIN I (HIGH SENSITIVITY)  ? ?____________________________________________ ? ?EKG ? ? EKG Interpretation ? ?Date/Time:  Wednesday October 12 2021 09:28:53 EDT ?Ventricular Rate:  74 ?PR Interval:  196 ?QRS Duration: 132 ?QT Interval:  442 ?QTC Calculation: 491 ?R Axis:   -80 ?Text Interpretation: Sinus rhythm Nonspecific IVCD with LAD Anteroseptal infarct, old Unchanged from Feb 2023 tracing Confirmed by Alona Bene [id number]) on 10/12/2021 9:32:57 AM ?  ? ?  ? ? ?____________________________________________ ? ? ?PROCEDURES ? ?Procedure(s) performed:  ? ?Procedures ? ?None  ?____________________________________________ ? ? ?INITIAL IMPRESSION / ASSESSMENT AND PLAN / ED COURSE ? ?Pertinent labs & imaging results that were available during my care of the patient were reviewed by me and considered in my medical decision making (see chart for details). ?  ?This patient is Presenting for Evaluation of weakness, which does require a range of treatment options, and is a complaint that involves a high risk of morbidity and mortality. ? ?The Differential Diagnoses include ACS, arrhythmia, abnormal blood glucose, metabolic derrangement. ? ?I decided to review pertinent External Data, and in summary no recent ED visits for similar. ?  ?Clinical Laboratory Tests Ordered, included normal troponin. No leukocytosis. Mild anemia but similar to prior values. No AKI. Mild hyperglycemia without gap acidosis.  ? ?Cardiac Monitor Tracing which shows NSR.  ? ? ?Social Determinants of Health Risk patient is a non-smoker.  ? ?Medical Decision  Making: Summary:  ?Patient presents emergency department by EMS with episode of some generalized weakness along with diaphoresis.  No chest pain or pressure.  EKG appears similar to that taken in February.  Plan for screening blood work including troponin but lower suspicion for ACS causing symptoms.  If labs are reassuring plan for return to work and PCP follow-up.  ? ?Reevaluation with update and discussion with patient. Results are reassuring. Plan for d/c with ED return precautions.  ? ?Disposition: discahrge ? ?____________________________________________ ? ?FINAL CLINICAL IMPRESSION(S) / ED DIAGNOSES ? ?Final diagnoses:  ?Weakness  ? ? ?Note:  This document was prepared using Dragon voice recognition software and may include unintentional dictation errors. ? ?Alona Bene, MD, FACEP ?Emergency Medicine ? ?  ?Maia Plan, MD ?10/13/21 1100 ? ?

## 2021-10-12 NOTE — Discharge Instructions (Signed)
You are seen in the emergency room today with fatigue and some sweating.  Your lab work here is reassuring.  Please continue your home medications and follow closely with your primary care doctor. ?

## 2021-10-12 NOTE — ED Triage Notes (Signed)
States at work was being sweaty and checked sugar and was 196.  Work called EMS to bring in to be checked. Denies SOB.  Recent eye surgery.  NAD ?

## 2021-10-18 ENCOUNTER — Telehealth: Payer: Self-pay | Admitting: Family Medicine

## 2021-10-18 NOTE — Telephone Encounter (Signed)
I called patient to discuss with him, explained that with a urinary tract infection especially in a man I would recommend that he be seen ASAP.  I again offered to see him tomorrow but he does not think this will work out with his job.  In this case I suggested he go to an urgent care this evening and he plans to do so ?

## 2021-10-18 NOTE — Telephone Encounter (Signed)
Pt was seen almost 1 month ago for the UTI. Do you agree that he needs an OV? ?

## 2021-10-18 NOTE — Telephone Encounter (Signed)
Pt states he needs med refill for uti he's been treated for  ? ?Please advise  ?

## 2021-10-18 NOTE — Telephone Encounter (Signed)
Pt says tomorrow is too short of notice to give his job. They have to have a 48 hr notice before taking anytime off of work- so he wont be able to make the appointment tomorrow. ? ?He did say he will be off starting this Thursday until next Thursday d/t his eye surgery and can come in that time period. I let him know that I would let you know.  ?

## 2021-10-31 ENCOUNTER — Emergency Department (HOSPITAL_BASED_OUTPATIENT_CLINIC_OR_DEPARTMENT_OTHER)
Admission: EM | Admit: 2021-10-31 | Discharge: 2021-10-31 | Disposition: A | Discharge status: Home / Self Care | Payer: No Typology Code available for payment source | Attending: Emergency Medicine | Admitting: Emergency Medicine

## 2021-10-31 ENCOUNTER — Other Ambulatory Visit: Payer: Self-pay

## 2021-10-31 ENCOUNTER — Encounter (HOSPITAL_BASED_OUTPATIENT_CLINIC_OR_DEPARTMENT_OTHER): Payer: Self-pay | Admitting: Obstetrics and Gynecology

## 2021-10-31 DIAGNOSIS — Z20822 Contact with and (suspected) exposure to covid-19: Secondary | ICD-10-CM | POA: Diagnosis not present

## 2021-10-31 DIAGNOSIS — J069 Acute upper respiratory infection, unspecified: Secondary | ICD-10-CM | POA: Diagnosis not present

## 2021-10-31 DIAGNOSIS — Z7982 Long term (current) use of aspirin: Secondary | ICD-10-CM | POA: Insufficient documentation

## 2021-10-31 DIAGNOSIS — J029 Acute pharyngitis, unspecified: Secondary | ICD-10-CM | POA: Diagnosis present

## 2021-10-31 LAB — RESP PANEL BY RT-PCR (FLU A&B, COVID) ARPGX2
Influenza A by PCR: NEGATIVE
Influenza B by PCR: NEGATIVE
SARS Coronavirus 2 by RT PCR: NEGATIVE

## 2021-10-31 LAB — GROUP A STREP BY PCR: Group A Strep by PCR: NOT DETECTED

## 2021-10-31 NOTE — ED Provider Notes (Signed)
?[facility] EMERGENCY DEPT ?Provider Note ? ? ?CSN: [id number] ?Arrival date & time: 10/31/21  1842 ? ?  ? ?History ? ?Chief Complaint  ?Patient presents with  ? Nausea  ? Sore Throat  ? ? ?Mitchell [name] is a 51 y.o. male. ? ?Patient is a 51 year old male who presents with a sore throat.  He has had a 2 to 3-day history of some runny nose congestion and sore throat.  He has a little bit of hoarseness as well.  No fevers.  He has some nausea but no vomiting.  No shortness of breath.  No difficulty swallowing his secretions.  He has been using salt water gargles with no improvement in symptoms. ? ? ?  ? ?Home Medications ?Prior to Admission medications   ?Medication Sig Start Date End Date Taking? Authorizing Provider  ?amitriptyline (ELAVIL) 10 MG tablet TAKE 1 TABLET BY MOUTH AT BEDTIME 09/06/21   [name], Drema Pry, MD  ?ascorbic acid (VITAMIN C) 250 MG tablet Take 1 tablet (250 mg total) by mouth daily. 02/18/21   Jacquelynn Cree, PA-C  ?aspirin 81 MG EC tablet Take 1 tablet (81 mg total) by mouth daily. 04/13/21   Danford, Earl Lites, MD  ?carvedilol (COREG) 12.5 MG tablet Take 1.5 tablets (18.75 mg total) by mouth 2 (two) times daily with a meal. 06/01/21   Milford, Anderson Malta, FNP  ?cefdinir (OMNICEF) 300 MG capsule Take 1 capsule (300 mg total) by mouth 2 (two) times daily. 09/15/21   Lorre Nick, MD  ?cholecalciferol (VITAMIN D3) 25 MCG (1000 UNIT) tablet Take 1,000 Units by mouth daily.    [provider]  ?clopidogrel (PLAVIX) 75 MG tablet Take 1 tablet by mouth once daily 09/30/21   Bensimhon, [name], MD  ?Continuous Blood Gluc Receiver (FREESTYLE LIBRE 2 READER) DEVI 1 each by Does not apply route daily. ?Patient not taking: Reported on 09/06/2021 07/25/21   Horton Chin, MD  ?Continuous Blood Gluc Sensor (FREESTYLE LIBRE 3 SENSOR) MISC 1 each by Does not apply route daily. Place 1 sensor on the skin every 14 days. Use to check glucose continuously ?Patient not taking: Reported on  09/06/2021 07/25/21   Horton Chin, MD  ?Continuous Blood Gluc Transmit (DEXCOM G6 TRANSMITTER) MISC 1 each by Does not apply route daily. ?Patient not taking: Reported on 09/06/2021 07/15/21   Horton Chin, MD  ?dapagliflozin propanediol (FARXIGA) 10 MG TABS tablet Take 1 tablet (10 mg total) by mouth daily before breakfast. 05/04/21   Milford, Anderson Malta, FNP  ?diclofenac Sodium (VOLTAREN) 1 % GEL Apply 4 g topically 4 (four) times daily. 09/23/21   Melene Plan, DO  ?gabapentin (NEURONTIN) 100 MG capsule Take 1 capsule by mouth at bedtime ?Patient taking differently: daily as needed. 07/28/21   Nadara Mustard, MD  ?hydrocerin (EUCERIN) CREA Apply 1 application topically 2 (two) times daily. To left foot/leg 02/17/21   Love, Evlyn Kanner, PA-C  ?Insulin Glargine (BASAGLAR KWIKPEN) 100 UNIT/ML Inject 21 Units into the skin 2 (two) times daily.    [provider]  ?methocarbamol (ROBAXIN) 500 MG tablet TAKE 1 TABLET BY MOUTH EVERY 6 HOURS AS NEEDED FOR MUSCLE SPASM 08/03/21   [name], Drema Pry, MD  ?oxyCODONE-acetaminophen (PERCOCET/ROXICET) 5-325 MG tablet Take 1 tablet by mouth every 8 (eight) hours as needed for severe pain. 09/29/21   Nadara Mustard, MD  ?pantoprazole (PROTONIX) 40 MG tablet Take 1 tablet by mouth once daily 08/03/21   [name], Drema Pry, MD  ?  potassium chloride SA (KLOR-CON) 20 MEQ tablet Take 1 tablet with Torsemide. ?Patient taking differently: as needed. Take 1 tablet with Torsemide. 04/21/21   Andrey Farmer, PA-C  ?rosuvastatin (CRESTOR) 40 MG tablet Take 1 tablet by mouth once daily 10/03/21   Jacklynn Ganong, FNP  ?sacubitril-valsartan (ENTRESTO) 97-103 MG Take 1 tablet by mouth 2 (two) times daily. 05/04/21   Jacklynn Ganong, FNP  ?spironolactone (ALDACTONE) 25 MG tablet Take 1 tablet by mouth once daily 09/30/21   Bensimhon, [name], MD  ?sulfamethoxazole-trimethoprim (BACTRIM DS) 800-160 MG tablet Take 1 tablet by mouth 2 (two) times daily. 09/24/21   [name], Gwenlyn Found, MD   ?torsemide (DEMADEX) 20 MG tablet Take 1 tablet (20 mg total) by mouth as needed. 04/21/21   Andrey Farmer, PA-C  ?   ? ?Allergies    ?Nsaids and Trulicity [dulaglutide]   ? ?Review of Systems   ?Review of Systems  ?Constitutional:  Negative for chills, diaphoresis, fatigue and fever.  ?HENT:  Positive for congestion, rhinorrhea and sore throat. Negative for sneezing.   ?Eyes: Negative.   ?Respiratory:  Positive for cough. Negative for chest tightness and shortness of breath.   ?Cardiovascular:  Negative for chest pain and leg swelling.  ?Gastrointestinal:  Positive for nausea. Negative for abdominal pain, blood in stool, diarrhea and vomiting.  ?Genitourinary:  Negative for difficulty urinating, flank pain, frequency and hematuria.  ?Musculoskeletal:  Negative for arthralgias and back pain.  ?Skin:  Negative for rash.  ?Neurological:  Negative for dizziness, speech difficulty, weakness, numbness and headaches.  ? ?Physical Exam ?Updated Vital Signs ?BP 136/82   Pulse 78   Temp 98.2 ?F (36.8 ?C) (Oral)   Resp 17   Ht 5\' 11"  (1.803 m)   Wt 128 kg   SpO2 100%   BMI 39.36 kg/m?  ?Physical Exam ?Constitutional:   ?   Appearance: He is well-developed.  ?HENT:  ?   Head: Normocephalic and atraumatic.  ?   Mouth/Throat:  ?   Mouth: Mucous membranes are moist.  ?   Pharynx: Uvula midline. No oropharyngeal exudate or posterior oropharyngeal erythema.  ?   Comments: No trismus, no sores, no peritonsillar fullness ? ?Eyes:  ?   Pupils: Pupils are equal, round, and reactive to light.  ?Cardiovascular:  ?   Rate and Rhythm: Normal rate and regular rhythm.  ?   Heart sounds: Normal heart sounds.  ?Pulmonary:  ?   Effort: Pulmonary effort is normal. No respiratory distress.  ?   Breath sounds: Normal breath sounds. No wheezing or rales.  ?Chest:  ?   Chest wall: No tenderness.  ?Abdominal:  ?   General: Bowel sounds are normal.  ?   Palpations: Abdomen is soft.  ?   Tenderness: There is no abdominal tenderness.  There is no guarding or rebound.  ?Musculoskeletal:     ?   General: Normal range of motion.  ?   Cervical back: Normal range of motion and neck supple.  ?Lymphadenopathy:  ?   Cervical: No cervical adenopathy.  ?Skin: ?   General: Skin is warm and dry.  ?   Findings: No rash.  ?Neurological:  ?   Mental Status: He is alert and oriented to person, place, and time.  ? ? ?ED Results / Procedures / Treatments   ?Labs ?(all labs ordered are listed, but only abnormal results are displayed) ?Labs Reviewed  ?GROUP A STREP BY PCR  ?RESP PANEL BY RT-PCR (FLU A&B,  COVID) [id number]  ? ? ?EKG ?None ? ?Radiology ?No results found. ? ?Procedures ?Procedures  ? ? ?Medications Ordered in ED ?Medications - No data to display ? ?ED Course/ Medical Decision Making/ A&P ?  ?                        ?Medical Decision Making ? ?Patient is a 51 year old male who presents with some runny nose congestion sore throat.  No fevers.  No difficulty swallowing or breathing.  No difficulty controlling his secretions.  His strep test is negative.  His COVID/flu test are negative.  His throat exam is benign.  No concerns for peritonsillar abscess or other deep tissue infection.  He likely has a viral URI.  He was discharged home in good condition.  Symptomatic care instructions were given.  Return precautions were given. ? ?Final Clinical Impression(s) / ED Diagnoses ?Final diagnoses:  ?Viral upper respiratory tract infection  ? ? ?Rx / DC Orders ?ED Discharge Orders   ? ? None  ? ?  ? ? ?  ?Rolan Bucco, MD ?10/31/21 2053 ? ?

## 2021-10-31 NOTE — ED Triage Notes (Signed)
Patient reports to the ER for nausea that occurred over the weekend and he feels like he cannot talk above a whisper because his throat is painful. Patient reports trying to gargle salt water and it not helping.  ?

## 2021-11-01 ENCOUNTER — Telehealth: Payer: Self-pay

## 2021-11-01 ENCOUNTER — Telehealth: Payer: Self-pay | Admitting: Orthopedic Surgery

## 2021-11-01 ENCOUNTER — Other Ambulatory Visit: Payer: Self-pay | Admitting: Orthopedic Surgery

## 2021-11-01 ENCOUNTER — Ambulatory Visit: Payer: No Typology Code available for payment source | Admitting: Endocrinology

## 2021-11-01 MED ORDER — OXYCODONE-ACETAMINOPHEN 5-325 MG PO TABS: 1.0000 | ORAL_TABLET | Freq: Three times a day (TID) | ORAL | 0 refills | Status: DC | PRN

## 2021-11-01 NOTE — Telephone Encounter (Signed)
Scheduled pt with you tomorrow at 11:20 am since the soonest appointment with JC would be on Thursday.  ?

## 2021-11-01 NOTE — Telephone Encounter (Signed)
Patient requesting medication refill for Percocet. He uses the pharmacy in the [place] on [address]  ?

## 2021-11-01 NOTE — Telephone Encounter (Signed)
Caller Name Domique Clapper ?Caller Phone Number [phone] ?Patient Name Mitchell [name] ?Patient DOB [birth year] ?Call Type Message Only Information Provided ?Reason for Call Request to Schedule Office Appointment ?Initial Comment Caller states he went to the hospital yesterday for weakness and nausea. He tested negative for ?covid and strep. He is requesting an appointment. ?Patient request to speak to RN No ?Additional Comment Caller declined triage ?Disp. Time Disposition Final User ?11/01/2021 7:49:01 AM General Information Provided Yes Bjorn Loser ?Call Closed By: Bjorn Loser ?Transaction Date/Time: 11/01/2021 7:45:29 AM (ET) ?

## 2021-11-01 NOTE — Telephone Encounter (Signed)
Last filled 09/29/21 ?S/p right BKA 12/2020 ?

## 2021-11-02 ENCOUNTER — Ambulatory Visit: Payer: No Typology Code available for payment source | Admitting: Family Medicine

## 2021-11-03 ENCOUNTER — Ambulatory Visit: Payer: No Typology Code available for payment source | Admitting: Dietician

## 2021-11-03 ENCOUNTER — Ambulatory Visit (INDEPENDENT_AMBULATORY_CARE_PROVIDER_SITE_OTHER): Payer: No Typology Code available for payment source | Admitting: Family Medicine

## 2021-11-03 ENCOUNTER — Encounter: Payer: Self-pay | Admitting: Family Medicine

## 2021-11-03 VITALS — BP 158/82 | HR 81 | Ht 71.0 in | Wt 275.8 lb

## 2021-11-03 DIAGNOSIS — J069 Acute upper respiratory infection, unspecified: Secondary | ICD-10-CM

## 2021-11-03 NOTE — Patient Instructions (Addendum)
Glad you are feeling better! ? ?Continue supportive measures including rest, hydration, humidifier use, steam showers, warm compresses to sinuses, warm liquids with lemon and honey, and over-the-counter cough, cold, and analgesics as needed.  ? ?

## 2021-11-03 NOTE — Progress Notes (Signed)
? ?Acute Office Visit ? ?Subjective:  ? ? Patient ID: Mitchell [name], male    DOB: 05/11/1971, 51 y.o.   MRN: [id number] ? ?CC: URI follow-up  ? ? ?HPI ?Patient is in today for URI ? ?Patient went to ED on 10/31/21 for 2-3 days of rhinorrhea, congestion, sore throat, mild nausea, hoarse voice He was diagnosed with viral URI and encouraged supportive measures and OTC treatments. He tried to go back to work the next day, but wasn't feeling well enough and had to go home early.  ? ?Reports that now he is feeling mostly better, no more nausea, cough, rhinorrhea. He would like a note to go back to work today. No fevers, fatigue, body aches, chills, GI/GU changes.  ? ?Hasn't taken his blood pressure medications this morning because he hasn't had breakfast yet. Does not monitor BP at home.  ? ? ? ? ?Past Medical History:  ?Diagnosis Date  ? Bipolar disorder ([place])   ? CAD (coronary artery disease)   ? a. diffuse 3v CAD by cath 2019, medical therapy recommended.  ? Cataract   ? forming   ? Chronic systolic CHF (congestive heart failure) ([place])   ? CVA (cerebral infarction)   ? No residual deficits  ? Depression   ? PTSD,   ? Diabetes mellitus   ? TYPE II; UNCONTROLLED BY HEMOGLOBIN A1c; STABLE AS  PER DISCHARGE  ? Headache(784.0)   ? Herpes simplex of male genitalia   ? History of colonic polyps   ? Hyperlipidemia   ? Hypertension   ? Myocardial infarction [facility]) 1987  ? (while playing football)  ? Obesity   ? OSA (obstructive sleep apnea)   ? repeat study 2018 without significant OSA  ? Pneumonia   ? Post-cardiac injury syndrome ([place])   ? History of cardiac injury from blunt trauma  ? Pulmonary hypertension ([place])   ? a. moderately elevated PASP 07/2019.  ? PVCs (premature ventricular contractions)   ? Schizophrenia ([place])   ? Goes to [facility]  ? Sleep apnea   ? Stroke [facility]) 2005  ? some left side weakness  ? Syncope   ? Recurrent, thought to be vasovagal. Also has h/o frequent PVCs.   ? ? ?Past Surgical  History:  ?Procedure Laterality Date  ? AMPUTATION Right 01/25/2021  ? Procedure: RIGHT BELOW KNEE AMPUTATION;  Surgeon: [name], MD;  Location: [place];  Service: Orthopedics;  Laterality: Right;  ? CARDIAC CATHETERIZATION  12/19/10  ? DIFFUSE NONOBSTRUCTIVE CAD; NONISCHEMIC CARDIOMYOPATHY; LEFT VENTRICULAR ANGIOGRAM WAS PERFORMED SECONDARY TO  ELEVATED LEFT VENTRICULAR FILLING PRESSURES  ? COLONOSCOPY    ? ~ age [age]  ? COLONOSCOPY W/ POLYPECTOMY    ? I & D EXTREMITY Bilateral 10/24/2019  ? Procedure: IRRIGATION AND DEBRIDEMENT BILATERAL EXTREMITY WOUND ON FOOT;  Surgeon: Cindra Presume, MD;  Location: [place];  Service: Plastics;  Laterality: Bilateral;  ? METATARSAL HEAD EXCISION Right 08/29/2018  ? Procedure: METATARSAL HEAD RESECTION;  Surgeon: Edrick Kins, DPM;  Location: [place];  Service: Podiatry;  Laterality: Right;  ? METATARSAL OSTEOTOMY Right 08/29/2018  ? Procedure: SUB FIFTHE METATARSIA RIGHT FOOT;  Surgeon: Edrick Kins, DPM;  Location: [place];  Service: Podiatry;  Laterality: Right;  ? MULTIPLE EXTRACTIONS WITH ALVEOLOPLASTY  01/27/2014  ? "all my teeth; 4 Quadrants of alveoloplasty  ? MULTIPLE EXTRACTIONS WITH ALVEOLOPLASTY N/A 01/27/2014  ? Procedure: EXTRACTION OF TEETH #'1, 2, 3, 4, 5, 6, 7, 8, 9, 10,  11, 12, 13, 14, 15, 16, 17, 20, 21, 22, 23, 24, 25, 26, 27, 28, 29, 31 and 32 WITH ALVEOLOPLASTY;  Surgeon: Lenn Cal, DDS;  Location: [place];  Service: Oral Surgery;  Laterality: N/A;  ? ORIF FINGER / THUMB FRACTURE Right   ? POLYPECTOMY    ? RIGHT HEART CATH N/A 01/27/2021  ? Procedure: RIGHT HEART CATH;  Surgeon: Jolaine Artist, MD;  Location: [place] CV LAB;  Service: Cardiovascular;  Laterality: N/A;  ? RIGHT/LEFT HEART CATH AND CORONARY ANGIOGRAPHY N/A 09/26/2017  ? Procedure: RIGHT/LEFT HEART CATH AND CORONARY ANGIOGRAPHY;  Surgeon: Jolaine Artist, MD;  Location: [place] CV LAB;  Service: Cardiovascular;  Laterality: N/A;  ? RIGHT/LEFT HEART CATH AND CORONARY  ANGIOGRAPHY N/A 04/12/2021  ? Procedure: RIGHT/LEFT HEART CATH AND CORONARY ANGIOGRAPHY;  Surgeon: Jolaine Artist, MD;  Location: [place] CV LAB;  Service: Cardiovascular;  Laterality: N/A;  ? SKIN SPLIT GRAFT Bilateral 10/24/2019  ? Procedure: SKIN GRAFT SPLIT THICKNESS LEFT THIGH;  Surgeon: Cindra Presume, MD;  Location: [place];  Service: Plastics;  Laterality: Bilateral;  ? WOUND DEBRIDEMENT Right 08/29/2018  ? Procedure: Debridement of ulcer on right fifth metatarsal;  Surgeon: Edrick Kins, DPM;  Location: [place];  Service: Podiatry;  Laterality: Right;  ? ? ?Family History  ?Problem Relation Age of Onset  ? Heart disease Mother   ?     MI  ? Heart failure Mother   ? Diabetes Mother   ?     ALSO IN MOST OF HIS SIBLINGS; 2 UNLCES HAVE ALSO PASSED AWAY FROM DM  ? Cardiomyopathy Mother   ? Cancer - Ovarian Mother   ? Ovarian cancer Mother   ? Heart disease Father   ? Hypertension Father   ? Diabetes Father   ? Diabetes Sister   ? Diabetes Brother   ? Colon cancer Paternal Uncle   ? Colon cancer Paternal Uncle   ? Colon polyps Neg Hx   ? Esophageal cancer Neg Hx   ? Rectal cancer Neg Hx   ? Stomach cancer Neg Hx   ? ? ?Social History  ? ?Socioeconomic History  ? Marital status: Married  ?  Spouse name: Not on file  ? Number of children: 5  ? Years of education: Not on file  ? Highest education level: Not on file  ?Occupational History  ? Occupation: Mortician  ?Tobacco Use  ? Smoking status: Never  ? Smokeless tobacco: Never  ?Vaping Use  ? Vaping Use: Never used  ?Substance and Sexual Activity  ? Alcohol use: No  ? Drug use: No  ? Sexual activity: Not on file  ?Other Topics Concern  ? Not on file  ?Social History Narrative  ? MARRIED, LIVES IN [place] WITH WIFE; 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  ? ?Social Determinants of Health  ? ?Financial Resource Strain: Medium Risk  ? Difficulty of Paying Living Expenses: Somewhat hard  ?Food  Insecurity: No Food Insecurity  ? Worried About Charity fundraiser in the Last Year: Never true  ? Ran Out of Food in the Last Year: Never true  ?Transportation Needs: No Transportation Needs  ? Lack of Transportation (Medical): No  ? Lack of Transportation (Non-Medical): No  ?Physical Activity: Not on file  ?Stress: Not on file  ?Social Connections: Not on file  ?Intimate Partner Violence: Not  on file  ? ? ?Outpatient Medications Prior to Visit  ?Medication Sig Dispense Refill  ? amitriptyline (ELAVIL) 10 MG tablet TAKE 1 TABLET BY MOUTH AT BEDTIME 30 tablet 0  ? ascorbic acid (VITAMIN C) 250 MG tablet Take 1 tablet (250 mg total) by mouth daily. 30 tablet 0  ? aspirin 81 MG EC tablet Take 1 tablet (81 mg total) by mouth daily. 30 tablet 12  ? carvedilol (COREG) 12.5 MG tablet Take 1.5 tablets (18.75 mg total) by mouth 2 (two) times daily with a meal. 90 tablet 5  ? cholecalciferol (VITAMIN D3) 25 MCG (1000 UNIT) tablet Take 1,000 Units by mouth daily.    ? clopidogrel (PLAVIX) 75 MG tablet Take 1 tablet by mouth once daily 30 tablet 0  ? Continuous Blood Gluc Receiver (FREESTYLE LIBRE 2 READER) DEVI 1 each by Does not apply route daily. (Patient not taking: Reported on 09/06/2021) 30 each 3  ? Continuous Blood Gluc Sensor (FREESTYLE LIBRE 3 SENSOR) MISC 1 each by Does not apply route daily. Place 1 sensor on the skin every 14 days. Use to check glucose continuously (Patient not taking: Reported on 09/06/2021) 2 each 3  ? Continuous Blood Gluc Transmit (DEXCOM G6 TRANSMITTER) MISC 1 each by Does not apply route daily. (Patient not taking: Reported on 09/06/2021) 3 each 3  ? dapagliflozin propanediol (FARXIGA) 10 MG TABS tablet Take 1 tablet (10 mg total) by mouth daily before breakfast. 30 tablet 11  ? diclofenac Sodium (VOLTAREN) 1 % GEL Apply 4 g topically 4 (four) times daily. 100 g 0  ? gabapentin (NEURONTIN) 100 MG capsule Take 1 capsule by mouth at bedtime (Patient taking differently: daily as needed.) 30 capsule  0  ? hydrocerin (EUCERIN) CREA Apply 1 application topically 2 (two) times daily. To left foot/leg 454 g 0  ? Insulin Glargine (BASAGLAR KWIKPEN) 100 UNIT/ML Inject 21 Units into the skin 2 (two) times daily.    ?

## 2021-11-22 ENCOUNTER — Ambulatory Visit (INDEPENDENT_AMBULATORY_CARE_PROVIDER_SITE_OTHER): Payer: No Typology Code available for payment source | Admitting: Internal Medicine

## 2021-11-22 ENCOUNTER — Encounter: Payer: Self-pay | Admitting: Internal Medicine

## 2021-11-22 VITALS — BP 142/76 | HR 76 | Ht 71.5 in | Wt 279.0 lb

## 2021-11-22 DIAGNOSIS — I5022 Chronic systolic (congestive) heart failure: Secondary | ICD-10-CM

## 2021-11-22 DIAGNOSIS — I2510 Atherosclerotic heart disease of native coronary artery without angina pectoris: Secondary | ICD-10-CM

## 2021-11-22 NOTE — Addendum Note (Signed)
Addended by: Marinus Maw on: 11/22/2021 01:29 PM ? ? Modules accepted: Level of Service ? ?

## 2021-11-22 NOTE — Patient Instructions (Addendum)
Medication Instructions:  ?Your physician recommends that you continue on your current medications as directed. Please refer to the Current Medication list given to you today. ? ?Labwork: ?None ordered. ? ?Testing/Procedures: ?Your physician has recommended that you have a defibrillator inserted. An implantable cardioverter defibrillator (ICD) is a small device that is placed in your chest or, in rare cases, your abdomen. This device uses electrical pulses or shocks to help control life-threatening, irregular heartbeats that could lead the heart to suddenly stop beating (sudden cardiac arrest). Leads are attached to the ICD that goes into your heart. This is done in the hospital and usually requires an overnight stay. Please see the instruction sheet given to you today for more information. ? ?Follow-Up: ? ?The following dates are available for ICD implant: ?May 18, 25, 30 ?June 15, 16, 20, 27, 29 ? ? ?Any Other Special Instructions Will Be Listed Below (If Applicable). ? ?If you need a refill on your cardiac medications before your next appointment, please call your pharmacy.  ? ?Important Information About Sugar ? ? ? ? ? ? ? ?

## 2021-11-22 NOTE — Progress Notes (Signed)
? ? ? ? ?HPI ?[name] returns today after a 4 year absence from our EP clinic. He is a pleasant 51 yo man with a h/o both ischemic and non-ischemic CM. He has longstanding LV dysfunction despite maximal GDMT. He has a h/o syncope remotely. He had covid and developed a leg infection and had to have an amputation. He denies anginal symptoms.  ?Allergies  ?Allergen Reactions  ? Nsaids Anaphylaxis and Other (See Comments)  ?  Able to tolerate aspirin   ? Trulicity [Dulaglutide] Nausea Only  ? ? ? ?Current Outpatient Medications  ?Medication Sig Dispense Refill  ? amitriptyline (ELAVIL) 10 MG tablet TAKE 1 TABLET BY MOUTH AT BEDTIME 30 tablet 0  ? ascorbic acid (VITAMIN C) 250 MG tablet Take 1 tablet (250 mg total) by mouth daily. 30 tablet 0  ? aspirin 81 MG EC tablet Take 1 tablet (81 mg total) by mouth daily. 30 tablet 12  ? carvedilol (COREG) 12.5 MG tablet Take 1.5 tablets (18.75 mg total) by mouth 2 (two) times daily with a meal. 90 tablet 5  ? cholecalciferol (VITAMIN D3) 25 MCG (1000 UNIT) tablet Take 1,000 Units by mouth daily.    ? clopidogrel (PLAVIX) 75 MG tablet Take 1 tablet by mouth once daily 30 tablet 0  ? Continuous Blood Gluc Receiver (FREESTYLE LIBRE 2 READER) DEVI 1 each by Does not apply route daily. 30 each 3  ? Continuous Blood Gluc Sensor (FREESTYLE LIBRE 3 SENSOR) MISC 1 each by Does not apply route daily. Place 1 sensor on the skin every 14 days. Use to check glucose continuously 2 each 3  ? Continuous Blood Gluc Transmit (DEXCOM G6 TRANSMITTER) MISC 1 each by Does not apply route daily. 3 each 3  ? dapagliflozin propanediol (FARXIGA) 10 MG TABS tablet Take 1 tablet (10 mg total) by mouth daily before breakfast. 30 tablet 11  ? diclofenac Sodium (VOLTAREN) 1 % GEL Apply 4 g topically 4 (four) times daily. 100 g 0  ? gabapentin (NEURONTIN) 100 MG capsule Take 1 capsule by mouth at bedtime (Patient taking differently: daily as needed.) 30 capsule 0  ? hydrocerin (EUCERIN) CREA Apply 1  application topically 2 (two) times daily. To left foot/leg 454 g 0  ? Insulin Glargine (BASAGLAR KWIKPEN) 100 UNIT/ML Inject 21 Units into the skin 2 (two) times daily.    ? methocarbamol (ROBAXIN) 500 MG tablet TAKE 1 TABLET BY MOUTH EVERY 6 HOURS AS NEEDED FOR MUSCLE SPASM 60 tablet 0  ? oxyCODONE-acetaminophen (PERCOCET/ROXICET) 5-325 MG tablet Take 1 tablet by mouth every 8 (eight) hours as needed for severe pain. 20 tablet 0  ? pantoprazole (PROTONIX) 40 MG tablet Take 1 tablet by mouth once daily 30 tablet 0  ? potassium chloride SA (KLOR-CON) 20 MEQ tablet Take 1 tablet with Torsemide. (Patient taking differently: as needed. Take 1 tablet with Torsemide.) 15 tablet 3  ? rosuvastatin (CRESTOR) 40 MG tablet Take 1 tablet by mouth once daily 30 tablet 0  ? sacubitril-valsartan (ENTRESTO) 97-103 MG Take 1 tablet by mouth 2 (two) times daily. 60 tablet 11  ? spironolactone (ALDACTONE) 25 MG tablet Take 1 tablet by mouth once daily 30 tablet 0  ? torsemide (DEMADEX) 20 MG tablet Take 1 tablet (20 mg total) by mouth as needed. 15 tablet 3  ? ?No current facility-administered medications for this visit.  ? ? ? ?Past Medical History:  ?Diagnosis Date  ? Bipolar disorder ([place])   ? CAD (coronary artery disease)   ?  a. diffuse 3v CAD by cath 2019, medical therapy recommended.  ? Cataract   ? forming   ? Chronic systolic CHF (congestive heart failure) ([place])   ? CVA (cerebral infarction)   ? No residual deficits  ? Depression   ? PTSD,   ? Diabetes mellitus   ? TYPE II; UNCONTROLLED BY HEMOGLOBIN A1c; STABLE AS  PER DISCHARGE  ? Headache(784.0)   ? Herpes simplex of male genitalia   ? History of colonic polyps   ? Hyperlipidemia   ? Hypertension   ? Myocardial infarction [facility]) 1987  ? (while playing football)  ? Obesity   ? OSA (obstructive sleep apnea)   ? repeat study 2018 without significant OSA  ? Pneumonia   ? Post-cardiac injury syndrome ([place])   ? History of cardiac injury from blunt trauma  ? Pulmonary hypertension  ([place])   ? a. moderately elevated PASP 07/2019.  ? PVCs (premature ventricular contractions)   ? Schizophrenia ([place])   ? Goes to [facility]  ? Sleep apnea   ? Stroke [facility]) 2005  ? some left side weakness  ? Syncope   ? Recurrent, thought to be vasovagal. Also has h/o frequent PVCs.   ? ? ?ROS: ? ? All systems reviewed and negative except as noted in the HPI. ? ? ?Past Surgical History:  ?Procedure Laterality Date  ? AMPUTATION Right 01/25/2021  ? Procedure: RIGHT BELOW KNEE AMPUTATION;  Surgeon: [name], MD;  Location: [place];  Service: Orthopedics;  Laterality: Right;  ? CARDIAC CATHETERIZATION  12/19/10  ? DIFFUSE NONOBSTRUCTIVE CAD; NONISCHEMIC CARDIOMYOPATHY; LEFT VENTRICULAR ANGIOGRAM WAS PERFORMED SECONDARY TO  ELEVATED LEFT VENTRICULAR FILLING PRESSURES  ? COLONOSCOPY    ? ~ age 29-23  ? COLONOSCOPY W/ POLYPECTOMY    ? I & D EXTREMITY Bilateral 10/24/2019  ? Procedure: IRRIGATION AND DEBRIDEMENT BILATERAL EXTREMITY WOUND ON FOOT;  Surgeon: Cindra Presume, MD;  Location: [place];  Service: Plastics;  Laterality: Bilateral;  ? METATARSAL HEAD EXCISION Right 08/29/2018  ? Procedure: METATARSAL HEAD RESECTION;  Surgeon: Edrick Kins, DPM;  Location: [place];  Service: Podiatry;  Laterality: Right;  ? METATARSAL OSTEOTOMY Right 08/29/2018  ? Procedure: SUB FIFTHE METATARSIA RIGHT FOOT;  Surgeon: Edrick Kins, DPM;  Location: [place];  Service: Podiatry;  Laterality: Right;  ? MULTIPLE EXTRACTIONS WITH ALVEOLOPLASTY  01/27/2014  ? "all my teeth; 4 Quadrants of alveoloplasty  ? MULTIPLE EXTRACTIONS WITH ALVEOLOPLASTY N/A 01/27/2014  ? Procedure: EXTRACTION OF TEETH #'1, 2, 3, 4, 5, 6, 7, 8, 9, 10, 11, 12, 13, 14, 15, 16, 17, 20, 21, 22, 23, 24, 25, 26, 27, 28, 29, 31 and 32 WITH ALVEOLOPLASTY;  Surgeon: Lenn Cal, DDS;  Location: [place];  Service: Oral Surgery;  Laterality: N/A;  ? ORIF FINGER / THUMB FRACTURE Right   ? POLYPECTOMY    ? RIGHT HEART CATH N/A 01/27/2021  ? Procedure: RIGHT HEART  CATH;  Surgeon: Jolaine Artist, MD;  Location: [place] CV LAB;  Service: Cardiovascular;  Laterality: N/A;  ? RIGHT/LEFT HEART CATH AND CORONARY ANGIOGRAPHY N/A 09/26/2017  ? Procedure: RIGHT/LEFT HEART CATH AND CORONARY ANGIOGRAPHY;  Surgeon: Jolaine Artist, MD;  Location: [place] CV LAB;  Service: Cardiovascular;  Laterality: N/A;  ? RIGHT/LEFT HEART CATH AND CORONARY ANGIOGRAPHY N/A 04/12/2021  ? Procedure: RIGHT/LEFT HEART CATH AND CORONARY ANGIOGRAPHY;  Surgeon: Jolaine Artist, MD;  Location: [place] CV LAB;  Service: Cardiovascular;  Laterality: N/A;  ?  SKIN SPLIT GRAFT Bilateral 10/24/2019  ? Procedure: SKIN GRAFT SPLIT THICKNESS LEFT THIGH;  Surgeon: Cindra Presume, MD;  Location: [place];  Service: Plastics;  Laterality: Bilateral;  ? WOUND DEBRIDEMENT Right 08/29/2018  ? Procedure: Debridement of ulcer on right fifth metatarsal;  Surgeon: Edrick Kins, DPM;  Location: [place];  Service: Podiatry;  Laterality: Right;  ? ? ? ?Family History  ?Problem Relation Age of Onset  ? Heart disease Mother   ?     MI  ? Heart failure Mother   ? Diabetes Mother   ?     ALSO IN MOST OF HIS SIBLINGS; 2 UNLCES HAVE ALSO PASSED AWAY FROM DM  ? Cardiomyopathy Mother   ? Cancer - Ovarian Mother   ? Ovarian cancer Mother   ? Heart disease Father   ? Hypertension Father   ? Diabetes Father   ? Diabetes Sister   ? Diabetes Brother   ? Colon cancer Paternal Uncle   ? Colon cancer Paternal Uncle   ? Colon polyps Neg Hx   ? Esophageal cancer Neg Hx   ? Rectal cancer Neg Hx   ? Stomach cancer Neg Hx   ? ? ? ?Social History  ? ?Socioeconomic History  ? Marital status: Married  ?  Spouse name: Not on file  ? Number of children: 5  ? Years of education: Not on file  ? Highest education level: Not on file  ?Occupational History  ? Occupation: Mortician  ?Tobacco Use  ? Smoking status: Never  ? Smokeless tobacco: Never  ?Vaping Use  ? Vaping Use: Never used  ?Substance and Sexual Activity  ? Alcohol use: No  ? Drug  use: No  ? Sexual activity: Not on file  ?Other Topics Concern  ? Not on file  ?Social History Narrative  ? 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

## 2021-11-26 ENCOUNTER — Other Ambulatory Visit (HOSPITAL_COMMUNITY): Payer: Self-pay | Admitting: Internal Medicine

## 2021-11-26 ENCOUNTER — Other Ambulatory Visit (HOSPITAL_COMMUNITY): Payer: Self-pay | Admitting: Family Medicine

## 2021-11-29 ENCOUNTER — Ambulatory Visit (HOSPITAL_COMMUNITY)
Admission: RE | Admit: 2021-11-29 | Discharge: 2021-11-29 | Disposition: A | Discharge status: Home / Self Care | Payer: No Typology Code available for payment source | Source: Ambulatory Visit | Attending: Internal Medicine | Admitting: Internal Medicine

## 2021-11-29 ENCOUNTER — Encounter (HOSPITAL_COMMUNITY): Payer: Self-pay | Admitting: Internal Medicine

## 2021-11-29 VITALS — BP 138/88 | HR 74 | Wt 280.6 lb

## 2021-11-29 DIAGNOSIS — I10 Essential (primary) hypertension: Secondary | ICD-10-CM | POA: Diagnosis not present

## 2021-11-29 DIAGNOSIS — I5022 Chronic systolic (congestive) heart failure: Secondary | ICD-10-CM

## 2021-11-29 DIAGNOSIS — I2510 Atherosclerotic heart disease of native coronary artery without angina pectoris: Secondary | ICD-10-CM | POA: Diagnosis not present

## 2021-11-29 LAB — BASIC METABOLIC PANEL
Anion gap: 10 (ref 5–15)
BUN: 16 mg/dL (ref 6–20)
CO2: 28 mmol/L (ref 22–32)
Calcium: 8.9 mg/dL (ref 8.9–10.3)
Chloride: 106 mmol/L (ref 98–111)
Creatinine, Ser: 1.48 mg/dL — ABNORMAL HIGH (ref 0.61–1.24)
GFR, Estimated: 56 mL/min — ABNORMAL LOW (ref >60)
Glucose, Bld: 215 mg/dL — ABNORMAL HIGH (ref 70–99)
Potassium: 4.5 mmol/L (ref 3.5–5.1)
Sodium: 144 mmol/L (ref 135–145)

## 2021-11-29 LAB — MAGNESIUM: Magnesium: 2.3 mg/dL (ref 1.7–2.4)

## 2021-11-29 NOTE — Patient Instructions (Signed)
Medication Changes: ? ?None. Continue current regimen. ? ?Lab Work: ? ?Labs done today, your results will be available in MyChart, we will contact you for abnormal readings. ? ?Testing/Procedures: ? ?none ? ?Referrals: ? ?None. Follow up with nephrology.  ? ?Special Instructions // Education: ? ?Do the following things EVERYDAY: ?Weigh yourself in the morning before breakfast. Write it down and keep it in a log. ?Take your medicines as prescribed ?Eat low salt foods--Limit salt (sodium) to 2000 mg per day.  ?Stay as active as you can everyday ?Limit all fluids for the day to less than 2 liters ? ?Follow-Up in: 3-4 months with NP/PA clinic.  ? ?Follow up with Nephrology. Call to schedule your appointment.  ? ?At the [facility], you and your health needs are our priority. We have a designated team specialized in the treatment of Heart Failure. This Care Team includes your primary Heart Failure Specialized Cardiologist (physician), Advanced Practice Providers (APPs- Physician Assistants and Nurse Practitioners), and Pharmacist who all work together to provide you with the care you need, when you need it.  ? ?You may see any of the following providers on your designated Care Team at your next follow up: ? ?Dr Glori Bickers ?Dr Loralie Champagne ?Darrick Grinder, NP ?Lyda Jester, PA ?Jessica Milford,NP ?Marlyce Huge, PA ?Audry Riles, PharmD ? ? ?Please be sure to bring in all your medications bottles to every appointment.  ? ?Need to Contact us: ? ?If you have any questions or concerns before your next appointment please send [place] a message through [place] or call our office at [phone].   ? ?TO LEAVE A MESSAGE FOR THE NURSE SELECT OPTION 2, PLEASE LEAVE A MESSAGE INCLUDING: ?YOUR NAME ?DATE OF BIRTH ?CALL BACK NUMBER ?REASON FOR CALL**this is important as we prioritize the call backs ? ?YOU WILL RECEIVE A CALL BACK THE SAME DAY AS LONG AS YOU CALL BEFORE 4:00 PM ? ? ?

## 2021-11-29 NOTE — Progress Notes (Signed)
?ADVANCED HF CLINIC NOTE ? ?PCP: Mitchell Blinks, MD ?HF Cardiologist: Dr. Haroldine [name] ? ?HPI: ?Mitchell [name] is a 51 y.o.male with multiple medical problems HFrEF, mixed ischemic/nonischemic CM, 3v CAD, obesity, hypertension, type II DM, obstructive sleep apnea, bipolar disease/schizophrenia, prior CVA.  ?  ?Had R/LHC on 09/26/17 with 3v disease and preserved CO/CI. No revascularization options. Saw EP about ICD but did not f/u.  ? ?Patient admitted June 2022 with sepsis secondary to RLE abscess/cellulitis and COVID-19. He ended up requiring R BKA. ? ?Readmitted 04/11/21 with a/c HFrEF and NSTEMI.  He was diuresed with IV lasix and started on GDMT. L/RHC with severe 3 vessel CAD (no targets for intervention), RA 4, PA 49/17 (31), PCW 15, Fick CO/index 6.7/2.7. He was referred to paramedicine program at discharge. Visual impairment felt to be contributor to issues with medication compliance. ? ?Since his last hospitalization, we have been titrating GDMT.  ? ?Echo 2/23, EF 30-35%, RV ok. IVC not dilated.  ? ?He was treated for simple UTI. Seen at urgent care last week, 2/1,6 for LB/flank pain. UA +. CT w/o evidence pyelonephritis. No kidney stone.  ? ?He was seen by Dr Mitchell [name] on 11/22/21 for possible ICD. He is waiting Mitchell [name] to call him back.  ?. ?Today he returns for HF follow up. Overall feeling fine. Denies SOB/PND/Orthopnea. Appetite ok. No fever or chills. Weight at home 279-280 pounds. Taking all medications. He takes torsemide every few weeks. He works for [employer].  ? ? ?CARDIAC TESTING: ?R/LHC (9/22): ?  Prox LAD to Mid LAD lesion is 80% stenosed. ?  Dist LAD lesion is 99% stenosed. ?  Dist RCA lesion is 90% stenosed. ?  Ost 1st Mrg lesion is 95% stenosed. ?  1st Mrg lesion is 99% stenosed. ?  Ost 2nd Mrg to 2nd Mrg lesion is 99% stenosed. ?  Ost RPDA to RPDA lesion is 90% stenosed. ?  RPAV lesion is 95% stenosed. ?  Mid LM lesion is 20% stenosed. ? ?Findings: ?Ao = 131/84 (107) ?LV = 131/22 ?RA  = 4 ?RV = 49/11 ?PA = 49/17 (31) ?PCW = 15 ?Fick cardiac output/index = 6.7/2.7 ?PVR = 2.4 ?FA sat = 95% ?PA sat = 56%, 63% ? ?Assessment: ?Severe 3v diffuse CAD with moderate progression from previous ?Ischemic CM EF 25% ?Mild pulmonary venous HTN with preserved cardiac output ?  ?- Echo (6/22): EF 20-25%, mild LVH, trivial Mitchell ? ?ROS: All systems negative except as listed in HPI, PMH and Problem List. ? ?SH:  ?Social History  ? ?Socioeconomic History  ? Marital status: Married  ?  Spouse name: Not on file  ? Number of children: 5  ? Years of education: Not on file  ? Highest education level: Not on file  ?Occupational History  ? Occupation: Mortician  ?Tobacco Use  ? Smoking status: Never  ? Smokeless tobacco: Never  ?Vaping Use  ? Vaping Use: Never used  ?Substance and Sexual Activity  ? Alcohol use: No  ? Drug use: No  ? Sexual activity: Not on file  ?Other Topics Concern  ? Not on file  ?Social History Narrative  ? MARRIED, LIVES IN [place] WITH WIFE; 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  ? ?Social Determinants of Health  ? ?Financial Resource Strain: Medium Risk  ? Difficulty of Paying Living Expenses: Somewhat hard  ?Food Insecurity: No Food Insecurity  ?  Worried About Charity fundraiser in the Last Year: Never true  ? Ran Out of Food in the Last Year: Never true  ?Transportation Needs: No Transportation Needs  ? Lack of Transportation (Medical): No  ? Lack of Transportation (Non-Medical): No  ?Physical Activity: Not on file  ?Stress: Not on file  ?Social Connections: Not on file  ?Intimate Partner Violence: Not on file  ? ? ?FH:  ?Family History  ?Problem Relation Age of Onset  ? Heart disease Mother   ?     MI  ? Heart failure Mother   ? Diabetes Mother   ?     ALSO IN MOST OF HIS SIBLINGS; 2 UNLCES HAVE ALSO PASSED AWAY FROM DM  ? Cardiomyopathy Mother   ? Cancer - Ovarian Mother   ? Ovarian cancer Mother   ? Heart disease Father   ?  Hypertension Father   ? Diabetes Father   ? Diabetes Sister   ? Diabetes Brother   ? Colon cancer Paternal Uncle   ? Colon cancer Paternal Uncle   ? Colon polyps Neg Hx   ? Esophageal cancer Neg Hx   ? Rectal cancer Neg Hx   ? Stomach cancer Neg Hx   ? ? ?Past Medical History:  ?Diagnosis Date  ? Bipolar disorder ([place])   ? CAD (coronary artery disease)   ? a. diffuse 3v CAD by cath 2019, medical therapy recommended.  ? Cataract   ? forming   ? Chronic systolic CHF (congestive heart failure) ([place])   ? CVA (cerebral infarction)   ? No residual deficits  ? Depression   ? PTSD,   ? Diabetes mellitus   ? TYPE II; UNCONTROLLED BY HEMOGLOBIN A1c; STABLE AS  PER DISCHARGE  ? Headache(784.0)   ? Herpes simplex of male genitalia   ? History of colonic polyps   ? Hyperlipidemia   ? Hypertension   ? Myocardial infarction [facility]) 1987  ? (while playing football)  ? Obesity   ? OSA (obstructive sleep apnea)   ? repeat study 2018 without significant OSA  ? Pneumonia   ? Post-cardiac injury syndrome ([place])   ? History of cardiac injury from blunt trauma  ? Pulmonary hypertension ([place])   ? a. moderately elevated PASP 07/2019.  ? PVCs (premature ventricular contractions)   ? Schizophrenia ([place])   ? Goes to [facility]  ? Sleep apnea   ? Stroke [facility]) 2005  ? some left side weakness  ? Syncope   ? Recurrent, thought to be vasovagal. Also has h/o frequent PVCs.   ? ? ?Current Outpatient Medications  ?Medication Sig Dispense Refill  ? amitriptyline (ELAVIL) 10 MG tablet TAKE 1 TABLET BY MOUTH AT BEDTIME 30 tablet 0  ? ascorbic acid (VITAMIN C) 250 MG tablet Take 1 tablet (250 mg total) by mouth daily. 30 tablet 0  ? aspirin 81 MG EC tablet Take 1 tablet (81 mg total) by mouth daily. 30 tablet 12  ? carvedilol (COREG) 12.5 MG tablet Take 1.5 tablets (18.75 mg total) by mouth 2 (two) times daily with a meal. 90 tablet 5  ? cholecalciferol (VITAMIN D3) 25 MCG (1000 UNIT) tablet Take 1,000 Units by mouth daily.    ? clopidogrel  (PLAVIX) 75 MG tablet Take 1 tablet by mouth once daily 30 tablet 0  ? dapagliflozin propanediol (FARXIGA) 10 MG TABS tablet Take 1 tablet (10 mg total) by mouth daily before breakfast. 30 tablet 11  ? diclofenac Sodium (  VOLTAREN) 1 % GEL Apply 4 g topically 4 (four) times daily. (Patient taking differently: Apply 4 g topically 4 (four) times daily. As needed) 100 g 0  ? gabapentin (NEURONTIN) 100 MG capsule Take 1 capsule by mouth at bedtime (Patient taking differently: daily as needed.) 30 capsule 0  ? hydrocerin (EUCERIN) CREA Apply 1 application topically 2 (two) times daily. To left foot/leg (Patient taking differently: Apply 1 application. topically 2 (two) times daily. To left foot/leg as needed) 454 g 0  ? Insulin Glargine (BASAGLAR KWIKPEN) 100 UNIT/ML Inject 21 Units into the skin 2 (two) times daily.    ? methocarbamol (ROBAXIN) 500 MG tablet TAKE 1 TABLET BY MOUTH EVERY 6 HOURS AS NEEDED FOR MUSCLE SPASM 60 tablet 0  ? oxyCODONE-acetaminophen (PERCOCET/ROXICET) 5-325 MG tablet Take 1 tablet by mouth every 8 (eight) hours as needed for severe pain. 20 tablet 0  ? pantoprazole (PROTONIX) 40 MG tablet Take 1 tablet by mouth once daily (Patient taking differently: As needed) 30 tablet 0  ? potassium chloride SA (KLOR-CON) 20 MEQ tablet Take 1 tablet with Torsemide. (Patient taking differently: as needed. Take 1 tablet with Torsemide.) 15 tablet 3  ? rosuvastatin (CRESTOR) 40 MG tablet Take 1 tablet by mouth once daily 30 tablet 0  ? sacubitril-valsartan (ENTRESTO) 97-103 MG Take 1 tablet by mouth 2 (two) times daily. 60 tablet 11  ? spironolactone (ALDACTONE) 25 MG tablet Take 1 tablet by mouth once daily 30 tablet 0  ? torsemide (DEMADEX) 20 MG tablet Take 1 tablet (20 mg total) by mouth as needed. 15 tablet 3  ? ?No current facility-administered medications for this encounter.  ? ?Wt Readings from Last 3 Encounters:  ?11/29/21 127.3 kg (280 lb 9.6 oz)  ?11/22/21 126.6 kg (279 lb)  ?11/03/21 125.1 kg (275  lb 12.8 oz)  ? ?BP 138/88   Pulse 74   Wt 127.3 kg (280 lb 9.6 oz)   SpO2 98%   BMI 38.59 kg/m?  ? ?PHYSICAL EXAM: ?General:  Well appearing. No resp difficulty ?HEENT: normal ?Neck: supple. no JVD.

## 2021-12-06 ENCOUNTER — Other Ambulatory Visit: Payer: Self-pay | Admitting: Orthopedic Surgery

## 2021-12-06 ENCOUNTER — Telehealth: Payer: Self-pay | Admitting: Orthopedic Surgery

## 2021-12-06 MED ORDER — OXYCODONE-ACETAMINOPHEN 5-325 MG PO TABS: 1.0000 | ORAL_TABLET | Freq: Three times a day (TID) | ORAL | 0 refills | Status: DC | PRN

## 2021-12-06 NOTE — Telephone Encounter (Signed)
Oxycodone last filled 11/01/21, s/p BKA 12/2020 ?

## 2021-12-06 NOTE — Telephone Encounter (Signed)
Pt called for refill of pain medication. Please send to pharmacy on file. Pt phone number is [phone]. ?

## 2021-12-08 ENCOUNTER — Encounter (HOSPITAL_COMMUNITY): Payer: Self-pay | Admitting: *Deleted

## 2021-12-08 ENCOUNTER — Emergency Department (HOSPITAL_COMMUNITY): Payer: No Typology Code available for payment source

## 2021-12-08 ENCOUNTER — Observation Stay (HOSPITAL_COMMUNITY)
Admission: EM | Admit: 2021-12-08 | Discharge: 2021-12-08 | Disposition: A | Discharge status: Home / Self Care | Payer: No Typology Code available for payment source | Attending: Family Medicine | Admitting: Family Medicine

## 2021-12-08 ENCOUNTER — Other Ambulatory Visit: Payer: Self-pay

## 2021-12-08 ENCOUNTER — Observation Stay (HOSPITAL_BASED_OUTPATIENT_CLINIC_OR_DEPARTMENT_OTHER)
Admit: 2021-12-08 | Discharge: 2021-12-08 | Disposition: A | Discharge status: Home / Self Care | Payer: No Typology Code available for payment source | Attending: Adult Health | Admitting: Adult Health

## 2021-12-08 ENCOUNTER — Other Ambulatory Visit (HOSPITAL_COMMUNITY): Payer: Self-pay

## 2021-12-08 DIAGNOSIS — Z7982 Long term (current) use of aspirin: Secondary | ICD-10-CM | POA: Diagnosis not present

## 2021-12-08 DIAGNOSIS — E1165 Type 2 diabetes mellitus with hyperglycemia: Secondary | ICD-10-CM

## 2021-12-08 DIAGNOSIS — G4733 Obstructive sleep apnea (adult) (pediatric): Secondary | ICD-10-CM | POA: Diagnosis present

## 2021-12-08 DIAGNOSIS — Z7902 Long term (current) use of antithrombotics/antiplatelets: Secondary | ICD-10-CM | POA: Diagnosis not present

## 2021-12-08 DIAGNOSIS — I110 Hypertensive heart disease with heart failure: Secondary | ICD-10-CM | POA: Insufficient documentation

## 2021-12-08 DIAGNOSIS — R0602 Shortness of breath: Secondary | ICD-10-CM | POA: Diagnosis present

## 2021-12-08 DIAGNOSIS — R55 Syncope and collapse: Secondary | ICD-10-CM | POA: Diagnosis not present

## 2021-12-08 DIAGNOSIS — I5022 Chronic systolic (congestive) heart failure: Secondary | ICD-10-CM | POA: Diagnosis not present

## 2021-12-08 DIAGNOSIS — E876 Hypokalemia: Secondary | ICD-10-CM | POA: Diagnosis present

## 2021-12-08 DIAGNOSIS — R69 Illness, unspecified: Secondary | ICD-10-CM | POA: Diagnosis not present

## 2021-12-08 DIAGNOSIS — Z8673 Personal history of transient ischemic attack (TIA), and cerebral infarction without residual deficits: Secondary | ICD-10-CM | POA: Diagnosis not present

## 2021-12-08 DIAGNOSIS — N183 Chronic kidney disease, stage 3 unspecified: Secondary | ICD-10-CM

## 2021-12-08 DIAGNOSIS — R0600 Dyspnea, unspecified: Secondary | ICD-10-CM | POA: Diagnosis not present

## 2021-12-08 DIAGNOSIS — R0789 Other chest pain: Secondary | ICD-10-CM | POA: Insufficient documentation

## 2021-12-08 DIAGNOSIS — I10 Essential (primary) hypertension: Secondary | ICD-10-CM | POA: Diagnosis present

## 2021-12-08 DIAGNOSIS — I739 Peripheral vascular disease, unspecified: Secondary | ICD-10-CM

## 2021-12-08 DIAGNOSIS — I2510 Atherosclerotic heart disease of native coronary artery without angina pectoris: Secondary | ICD-10-CM | POA: Diagnosis not present

## 2021-12-08 DIAGNOSIS — Z794 Long term (current) use of insulin: Secondary | ICD-10-CM | POA: Insufficient documentation

## 2021-12-08 DIAGNOSIS — Z7984 Long term (current) use of oral hypoglycemic drugs: Secondary | ICD-10-CM | POA: Insufficient documentation

## 2021-12-08 DIAGNOSIS — E785 Hyperlipidemia, unspecified: Secondary | ICD-10-CM | POA: Diagnosis present

## 2021-12-08 DIAGNOSIS — E119 Type 2 diabetes mellitus without complications: Secondary | ICD-10-CM | POA: Insufficient documentation

## 2021-12-08 LAB — CBC WITH DIFFERENTIAL/PLATELET
Abs Immature Granulocytes: 0.02 10*3/uL (ref 0.00–0.07)
Basophils Absolute: 0 10*3/uL (ref 0.0–0.1)
Basophils Relative: 0 %
Eosinophils Absolute: 0.1 10*3/uL (ref 0.0–0.5)
Eosinophils Relative: 1 %
HCT: 38.4 % — ABNORMAL LOW (ref 39.0–52.0)
Hemoglobin: 12.2 g/dL — ABNORMAL LOW (ref 13.0–17.0)
Immature Granulocytes: 0 %
Lymphocytes Relative: 11 %
Lymphs Abs: 0.8 10*3/uL (ref 0.7–4.0)
MCH: 29.6 pg (ref 26.0–34.0)
MCHC: 31.8 g/dL (ref 30.0–36.0)
MCV: 93.2 fL (ref 80.0–100.0)
Monocytes Absolute: 0.5 10*3/uL (ref 0.1–1.0)
Monocytes Relative: 6 %
Neutro Abs: 5.9 10*3/uL (ref 1.7–7.7)
Neutrophils Relative %: 82 %
Platelets: 135 10*3/uL — ABNORMAL LOW (ref 150–400)
RBC: 4.12 MIL/uL — ABNORMAL LOW (ref 4.22–5.81)
RDW: 13.2 % (ref 11.5–15.5)
WBC: 7.3 10*3/uL (ref 4.0–10.5)
nRBC: 0 % (ref 0.0–0.2)

## 2021-12-08 LAB — URINALYSIS, ROUTINE W REFLEX MICROSCOPIC
Bilirubin Urine: NEGATIVE
Glucose, UA: >=500 mg/dL — AB
Ketones, ur: NEGATIVE mg/dL
Leukocytes,Ua: NEGATIVE
Nitrite: NEGATIVE
Protein, ur: NEGATIVE mg/dL
Specific Gravity, Urine: 1.006 (ref 1.005–1.030)
pH: 5 (ref 5.0–8.0)

## 2021-12-08 LAB — COMPREHENSIVE METABOLIC PANEL
ALT: 16 U/L (ref 0–44)
AST: 25 U/L (ref 15–41)
Albumin: 3.6 g/dL (ref 3.5–5.0)
Alkaline Phosphatase: 59 U/L (ref 38–126)
Anion gap: 8 (ref 5–15)
BUN: 16 mg/dL (ref 6–20)
CO2: 24 mmol/L (ref 22–32)
Calcium: 9.1 mg/dL (ref 8.9–10.3)
Chloride: 109 mmol/L (ref 98–111)
Creatinine, Ser: 1.31 mg/dL — ABNORMAL HIGH (ref 0.61–1.24)
GFR, Estimated: >60 mL/min (ref >60)
Glucose, Bld: 218 mg/dL — ABNORMAL HIGH (ref 70–99)
Potassium: 3.4 mmol/L — ABNORMAL LOW (ref 3.5–5.1)
Sodium: 141 mmol/L (ref 135–145)
Total Bilirubin: 0.9 mg/dL (ref 0.3–1.2)
Total Protein: 6.7 g/dL (ref 6.5–8.1)

## 2021-12-08 LAB — TROPONIN I (HIGH SENSITIVITY)
Troponin I (High Sensitivity): 34 ng/L — ABNORMAL HIGH (ref <18)
Troponin I (High Sensitivity): 48 ng/L — ABNORMAL HIGH (ref <18)

## 2021-12-08 LAB — BRAIN NATRIURETIC PEPTIDE: B Natriuretic Peptide: 787.9 pg/mL — ABNORMAL HIGH (ref 0.0–100.0)

## 2021-12-08 LAB — CBG MONITORING, ED: Glucose-Capillary: 207 mg/dL — ABNORMAL HIGH (ref 70–99)

## 2021-12-08 MED ORDER — INSULIN ASPART 100 UNIT/ML IJ SOLN: 0.0000 [IU] | Freq: Three times a day (TID) | INTRAMUSCULAR | Status: DC

## 2021-12-08 MED ORDER — SPIRONOLACTONE 25 MG PO TABS
25.0000 mg | ORAL_TABLET | Freq: Every day | ORAL | Status: DC
  Filled 2021-12-08: qty 1

## 2021-12-08 MED ORDER — CARVEDILOL 3.125 MG PO TABS: 18.7500 mg | ORAL_TABLET | Freq: Two times a day (BID) | ORAL | Status: DC

## 2021-12-08 MED ORDER — ACCU-CHEK SOFTCLIX LANCETS MISC
0 refills | Status: DC
  Filled 2021-12-08: qty 100, 25d supply, fill #0

## 2021-12-08 MED ORDER — SACUBITRIL-VALSARTAN 97-103 MG PO TABS
1.0000 | ORAL_TABLET | Freq: Two times a day (BID) | ORAL | Status: DC
  Filled 2021-12-08 (×2): qty 1

## 2021-12-08 MED ORDER — ACCU-CHEK GUIDE VI STRP
ORAL_STRIP | 0 refills | Status: DC
  Filled 2021-12-08: qty 100, 25d supply, fill #0

## 2021-12-08 MED ORDER — BLOOD GLUCOSE MONITOR SYSTEM W/DEVICE KIT
PACK | 0 refills | Status: DC
  Filled 2021-12-08: qty 1, 30d supply, fill #0

## 2021-12-08 MED ORDER — POTASSIUM CHLORIDE CRYS ER 20 MEQ PO TBCR: 40.0000 meq | EXTENDED_RELEASE_TABLET | Freq: Once | ORAL | Status: DC

## 2021-12-08 NOTE — ED Notes (Signed)
ED TO INPATIENT HANDOFF REPORT ? ?ED Nurse Name and Phone #: Lysbeth Galas [phone] ? ?S ?Name/Age/Gender ?Mitchell [name] ?51 y.o. ?male ?Room/Bed: 003C/003C ? ?Code Status ?  Code Status: Prior ? ?Home/SNF/Other ?Home ?Patient oriented to: self, place, time, and situation ?Is this baseline? Yes  ? ?Triage Complete: Triage complete  ?Chief Complaint ?Near syncope [R55] ? ?Triage Note ?Patient presents to ed via [facility] states he was at work started having chest pain with sob, upon ems arrival patient was cool diaphoretic.  C/o cough. States he takes Torsemide as needed hasn't taken in 1 week. States he took the dose this am. History of cardiac stents.    ? ?Allergies ?Allergies  ?Allergen Reactions  ? Nsaids Anaphylaxis and Other (See Comments)  ?  Able to tolerate aspirin   ? Trulicity [Dulaglutide] Nausea Only  ? ? ?Level of Care/Admitting Diagnosis ?ED Disposition   ? ? ED Disposition  ?Admit  ? Condition  ?--  ? Comment  ?Hospital Area: [facility] [id number] ? Level of Care: Telemetry Cardiac [103] ? May place patient in observation at [facility] or [place] if equivalent level of care is available:: No ? Covid Evaluation: Asymptomatic - no recent exposure (last 10 days) testing not required ? Diagnosis: Near syncope [phone] ? Admitting Physician: [name] [id number] ? Attending Physician: [name] [id number] ?  ?  ? ?  ? ? ?B ?Medical/Surgery History ?Past Medical History:  ?Diagnosis Date  ? Bipolar disorder ([place])   ? CAD (coronary artery disease)   ? a. diffuse 3v CAD by cath 2019, medical therapy recommended.  ? Cataract   ? forming   ? Chronic systolic CHF (congestive heart failure) ([place])   ? CVA (cerebral infarction)   ? No residual deficits  ? Depression   ? PTSD,   ? Diabetes mellitus   ? TYPE II; UNCONTROLLED BY HEMOGLOBIN A1c; STABLE AS  PER DISCHARGE  ? Headache(784.0)   ? Herpes simplex of male genitalia   ? History of colonic polyps   ? Hyperlipidemia   ? Hypertension   ?  Myocardial infarction [facility]) 1987  ? (while playing football)  ? Obesity   ? OSA (obstructive sleep apnea)   ? repeat study 2018 without significant OSA  ? Pneumonia   ? Post-cardiac injury syndrome ([place])   ? History of cardiac injury from blunt trauma  ? Pulmonary hypertension ([place])   ? a. moderately elevated PASP 07/2019.  ? PVCs (premature ventricular contractions)   ? Schizophrenia ([place])   ? Goes to [facility]  ? Sleep apnea   ? Stroke [facility]) 2005  ? some left side weakness  ? Syncope   ? Recurrent, thought to be vasovagal. Also has h/o frequent PVCs.   ? ?Past Surgical History:  ?Procedure Laterality Date  ? AMPUTATION Right 01/25/2021  ? Procedure: RIGHT BELOW KNEE AMPUTATION;  Surgeon: [name], MD;  Location: [place];  Service: Orthopedics;  Laterality: Right;  ? CARDIAC CATHETERIZATION  12/19/10  ? DIFFUSE NONOBSTRUCTIVE CAD; NONISCHEMIC CARDIOMYOPATHY; LEFT VENTRICULAR ANGIOGRAM WAS PERFORMED SECONDARY TO  ELEVATED LEFT VENTRICULAR FILLING PRESSURES  ? COLONOSCOPY    ? ~ age 81-23  ? COLONOSCOPY W/ POLYPECTOMY    ? I & D EXTREMITY Bilateral 10/24/2019  ? Procedure: IRRIGATION AND DEBRIDEMENT BILATERAL EXTREMITY WOUND ON FOOT;  Surgeon: Cindra Presume, MD;  Location: [place];  Service: Plastics;  Laterality: Bilateral;  ? METATARSAL HEAD EXCISION Right 08/29/2018  ?  Procedure: METATARSAL HEAD RESECTION;  Surgeon: Edrick Kins, DPM;  Location: [place];  Service: Podiatry;  Laterality: Right;  ? METATARSAL OSTEOTOMY Right 08/29/2018  ? Procedure: SUB FIFTHE METATARSIA RIGHT FOOT;  Surgeon: Edrick Kins, DPM;  Location: [place];  Service: Podiatry;  Laterality: Right;  ? MULTIPLE EXTRACTIONS WITH ALVEOLOPLASTY  01/27/2014  ? "all my teeth; 4 Quadrants of alveoloplasty  ? MULTIPLE EXTRACTIONS WITH ALVEOLOPLASTY N/A 01/27/2014  ? Procedure: EXTRACTION OF TEETH #'1, 2, 3, 4, 5, 6, 7, 8, 9, 10, 11, 12, 13, 14, 15, 16, 17, 20, 21, 22, 23, 24, 25, 26, 27, 28, 29, 31 and 32 WITH ALVEOLOPLASTY;  Surgeon:  Lenn Cal, DDS;  Location: [place];  Service: Oral Surgery;  Laterality: N/A;  ? ORIF FINGER / THUMB FRACTURE Right   ? POLYPECTOMY    ? RIGHT HEART CATH N/A 01/27/2021  ? Procedure: RIGHT HEART CATH;  Surgeon: Jolaine Artist, MD;  Location: [place] [facility];  Service: Cardiovascular;  Laterality: N/A;  ? RIGHT/LEFT HEART CATH AND CORONARY ANGIOGRAPHY N/A 09/26/2017  ? Procedure: RIGHT/LEFT HEART CATH AND CORONARY ANGIOGRAPHY;  Surgeon: Jolaine Artist, MD;  Location: [place] [facility];  Service: Cardiovascular;  Laterality: N/A;  ? RIGHT/LEFT HEART CATH AND CORONARY ANGIOGRAPHY N/A 04/12/2021  ? Procedure: RIGHT/LEFT HEART CATH AND CORONARY ANGIOGRAPHY;  Surgeon: Jolaine Artist, MD;  Location: [place] [facility];  Service: Cardiovascular;  Laterality: N/A;  ? SKIN SPLIT GRAFT Bilateral 10/24/2019  ? Procedure: SKIN GRAFT SPLIT THICKNESS LEFT THIGH;  Surgeon: Cindra Presume, MD;  Location: [place];  Service: Plastics;  Laterality: Bilateral;  ? WOUND DEBRIDEMENT Right 08/29/2018  ? Procedure: Debridement of ulcer on right fifth metatarsal;  Surgeon: Edrick Kins, DPM;  Location: [place];  Service: Podiatry;  Laterality: Right;  ?  ? ?A ?IV Location/Drains/Wounds ?Patient Lines/Drains/Airways Status   ? ? Active Line/Drains/Airways   ? ? Name Placement date Placement time Site Days  ? Peripheral IV 10/12/21 20 G 1" Left;Lateral Forearm 10/12/21  0935  Forearm  57  ? ?  ?  ? ?  ? ? ?Intake/Output Last 24 hours ? ?Intake/Output Summary (Last 24 hours) at 12/08/2021 1504 ?Last data filed at 12/08/2021 1055 ?Gross per 24 hour  ?Intake --  ?Output 1800 ml  ?Net -1800 ml  ? ? ?Labs/Imaging ?Results for orders placed or performed during the hospital encounter of 12/08/21 (from the past 48 hour(s))  ?Comprehensive metabolic panel     Status: Abnormal  ? Collection Time: 12/08/21  8:30 AM  ?Result Value Ref Range  ? Sodium 141 135 - 145 mmol/L  ? Potassium 3.4 (L) 3.5 - 5.1 mmol/L  ? Chloride 109 98 - 111  mmol/L  ? CO2 24 22 - 32 mmol/L  ? Glucose, Bld 218 (H) 70 - 99 mg/dL  ?  Comment: Glucose reference range applies only to samples taken after fasting for at least 8 hours.  ? BUN 16 6 - 20 mg/dL  ? Creatinine, Ser 1.31 (H) 0.61 - 1.24 mg/dL  ? Calcium 9.1 8.9 - 10.3 mg/dL  ? Total Protein 6.7 6.5 - 8.1 g/dL  ? Albumin 3.6 3.5 - 5.0 g/dL  ? AST 25 15 - 41 U/L  ? ALT 16 0 - 44 U/L  ? Alkaline Phosphatase 59 38 - 126 U/L  ? Total Bilirubin 0.9 0.3 - 1.2 mg/dL  ? GFR, Estimated >60 >60 mL/min  ?  Comment: (NOTE) ?Calculated  using the CKD-EPI Creatinine Equation (2021) ?  ? Anion gap 8 5 - 15  ?  Comment: Performed at [facility], [place] [address], [place], [place] [address]  ?Troponin I (High Sensitivity)     Status: Abnormal  ? Collection Time: 12/08/21  8:30 AM  ?Result Value Ref Range  ? Troponin I (High Sensitivity) 34 (H) <18 ng/L  ?  Comment: (NOTE) ?Elevated high sensitivity troponin I (hsTnI) values and significant  ?changes across serial measurements may suggest ACS but many other  ?chronic and acute conditions are known to elevate hsTnI results.  ?Refer to the "Links" section for chest pain algorithms and additional  ?guidance. ?Performed at [facility], [place] [address], [place], [place] ?[address] ?  ?CBC with Differential     Status: Abnormal  ? Collection Time: 12/08/21  8:30 AM  ?Result Value Ref Range  ? WBC 7.3 4.0 - 10.5 K/uL  ? RBC 4.12 (L) 4.22 - 5.81 MIL/uL  ? Hemoglobin 12.2 (L) 13.0 - 17.0 g/dL  ? HCT 38.4 (L) 39.0 - 52.0 %  ? MCV 93.2 80.0 - 100.0 fL  ? MCH 29.6 26.0 - 34.0 pg  ? MCHC 31.8 30.0 - 36.0 g/dL  ? RDW 13.2 11.5 - 15.5 %  ? Platelets 135 (L) 150 - 400 K/uL  ? nRBC 0.0 0.0 - 0.2 %  ? Neutrophils Relative % 82 %  ? Neutro Abs 5.9 1.7 - 7.7 K/uL  ? Lymphocytes Relative 11 %  ? Lymphs Abs 0.8 0.7 - 4.0 K/uL  ? Monocytes Relative 6 %  ? Monocytes Absolute 0.5 0.1 - 1.0 K/uL  ? Eosinophils Relative 1 %  ? Eosinophils Absolute 0.1 0.0 - 0.5 K/uL  ? Basophils Relative 0 %  ?  Basophils Absolute 0.0 0.0 - 0.1 K/uL  ? Immature Granulocytes 0 %  ? Abs Immature Granulocytes 0.02 0.00 - 0.07 K/uL  ?  Comment: Performed at [facility], [place] [address], [place], [place] [address]  ?Brain na

## 2021-12-08 NOTE — Assessment & Plan Note (Signed)
Blood pressure decently controlled ?Continue medical management with coreg, entresto, spironolactone  ?

## 2021-12-08 NOTE — ED Notes (Signed)
Got patient on the monitor did ekg shown er provider patient is resting with call bell in reach 

## 2021-12-08 NOTE — Assessment & Plan Note (Addendum)
R/LHC in 03/2021  severe 3 vessel CAD with no targets for intervention ?Troponins are flat and he denies chest pain, palpitations, near syncopal symptoms. Had diaphoresis and coughing. ?Cardiology consulted and cleared to go home.  ?Continue medical management with coreg, ASA/plavix and crestor.  ?

## 2021-12-08 NOTE — Assessment & Plan Note (Addendum)
Baseline around 1.3 ?Stable ?

## 2021-12-08 NOTE — Assessment & Plan Note (Signed)
Continue crestor 

## 2021-12-08 NOTE — Assessment & Plan Note (Addendum)
Diabetic foot wound complicated by sepsis that led to amputation on 01/25/2021 ?Continue aspirin/plavix and statin. ?

## 2021-12-08 NOTE — Discharge Instructions (Signed)
1.  Continue your regular medications. ?2.  Follow-up with your cardiologist as soon as possible ?3.  Return to the emergency department immediately if you feel chest pain, shortness of breath, feel like you will pass out or any other concerning symptoms. ?

## 2021-12-08 NOTE — Progress Notes (Addendum)
?  Transition of Care (TOC) Screening Note ? ? ?Patient Details  ?Name: Mitchell [name] ?Date of Birth: 1971-03-26 ? ? ?Transition of Care (TOC) CM/SW Contact:    ?Ellarae Nevitt, LCSW ?Phone Number: ?12/08/2021, 3:35 PM ? ? ? ?Transition of Care Department [facility]) has reviewed patient and TOC needs have been identified at this time including needing a  glucometer. We will continue to monitor patient advancement through interdisciplinary progression rounds. If new patient transition needs arise, please place a TOC consult. ?  ?

## 2021-12-08 NOTE — Consult Note (Addendum)
?  ?Advanced Heart Failure Team Consult Note ? ? ?Primary Physician: Darreld , MD ?PCP-Cardiologist:  Glori Bickers, MD ? ?Reason for Consultation: Heart Failure/Pre Syncope ? ?HPI:   ? ?KACIN CESARE is seen today for evaluation of heart failure  at the request of Dr [name] Blocker.  ? ?TRISTAN FLEURANT is a 51 y.o.male with multiple medical problems HFrEF, mixed ischemic/nonischemic CM, 3v CAD, obesity, hypertension, type II DM, obstructive sleep apnea, bipolar disease/schizophrenia, prior CVA.  ? ? Had R/LHC on 09/26/17 with 3v disease and preserved CO/CI. No revascularization options. Saw EP about ICD but did not f/u.  ?  ?Patient admitted June 2022 with sepsis secondary to RLE abscess/cellulitis and COVID-19. He ended up requiring R BKA. ? ?Readmitted 04/11/21 with a/c HFrEF and NSTEMI.  He was diuresed with IV lasix and started on GDMT. L/RHC with severe 3 vessel CAD (no targets for intervention), RA 4, PA 49/17 (31), PCW 15, Fick CO/index 6.7/2.7. He was referred to paramedicine program at discharge. Visual impairment felt to be contributor to issues with medication compliance. ? ?Echo 2/23, EF 30-35%, RV ok. IVC not dilated.  ? ?He was seen by Dr Lovena Le 10/2021 for ICD. At that time he wanted to think about it. Today he wants to know when he can get an ICD.   ? ?He was seen in the HF clinic 11/29/21 . He was stable on GDMT.  ? ?He takes torsemide as needed and took one torsemide today. Says he last took torsemide 7 days ago. He does not weigh at home. He does not have a scale.  ? ?Presented to [facility] with shortness of breath.  Arrived via EMS. Drove to work and when he arrived he was profusely sweating and coughing. Of note he does not have a glucometer. His co worker found him in his car. He denies syncope. ?Near presyncope. 911 was called.  ?CXR- no acute findings.Pertinent labs: K 3.4, creatinine 1.3, HS Trop 34>48, BNP 787, and WBC 7.3 . EKG SR with occasional PVCs and LBBB.  ? ?Review of Systems: [y] =  yes, [ ]  = no  ? ? ?General: Weight gain [ ] ; Weight loss [ ] ; Anorexia [ ] ; Fatigue [ ] ; Fever [ ] ; Chills [ ] ; Weakness [ ]   ?Cardiac: Chest pain/pressure [ ] ; Resting SOB [ ] ; Exertional SOB [ ] ; Orthopnea [ ] ; Pedal Edema [ ] ; Palpitations [ ] ; Syncope [ ] ; Presyncope [Y ]; Paroxysmal nocturnal dyspnea[ ]   ?Pulmonary: Cough [Y; Wheezing[ ] ; Hemoptysis[ ] ; Sputum [ ] ; Snoring [ ]   ?GI: Vomiting[ ] ; Dysphagia[ ] ; Melena[ ] ; Hematochezia [ ] ; Heartburn[ ] ; Abdominal pain [ ] ; Constipation [ ] ; Diarrhea [ ] ; BRBPR [ ]   ?GU: Hematuria[ ] ; Dysuria [ ] ; Nocturia[ ]   ?Vascular: Pain in legs with walking [ ] ; Pain in feet with lying flat [ ] ; Non-healing sores [ ] ; Stroke [Y ]; TIA [ ] ; Slurred speech [ ] ;  ?Neuro: Headaches[ ] ; Vertigo[ ] ; Seizures[ ] ; Paresthesias[ ] ;Blurred vision [ ] ; Diplopia [ ] ; Vision changes [ ]   ?Ortho/Skin: Arthritis [ ] ; Joint pain [ Y]; Muscle pain [ ] ; Joint swelling [ ] ; Back Pain [ Y]; Rash [ ]   ?Psych: Depression[ ] ; Anxiety[ ]   ?Heme: Bleeding problems [ ] ; Clotting disorders [ ] ; Anemia [ ]   ?Endocrine: Diabetes [ Y]; Thyroid dysfunction[ ]  ? ?Home Medications ?Prior to Admission medications   ?Medication Sig Start Date End Date Taking? Authorizing Provider  ?amitriptyline (ELAVIL) 10 MG tablet  TAKE 1 TABLET BY MOUTH AT BEDTIME 09/06/21   [name], [name], MD  ?ascorbic acid (VITAMIN C) 250 MG tablet Take 1 tablet (250 mg total) by mouth daily. 02/18/21   Bary Leriche, PA-C  ?aspirin 81 MG EC tablet Take 1 tablet (81 mg total) by mouth daily. 04/13/21   Danford, Suann Larry, MD  ?carvedilol (COREG) 12.5 MG tablet Take 1.5 tablets (18.75 mg total) by mouth 2 (two) times daily with a meal. 06/01/21   Milford, Maricela Bo, FNP  ?cholecalciferol (VITAMIN D3) 25 MCG (1000 UNIT) tablet Take 1,000 Units by mouth daily.    [provider]  ?clopidogrel (PLAVIX) 75 MG tablet Take 1 tablet by mouth once daily 11/28/21   Bensimhon, Shaune Pascal, MD  ?dapagliflozin propanediol (FARXIGA) 10 MG  TABS tablet Take 1 tablet (10 mg total) by mouth daily before breakfast. 05/04/21   Milford, Maricela Bo, FNP  ?diclofenac Sodium (VOLTAREN) 1 % GEL Apply 4 g topically 4 (four) times daily. ?Patient taking differently: Apply 4 g topically 4 (four) times daily. As needed 09/23/21   [name], DO  ?gabapentin (NEURONTIN) 100 MG capsule Take 1 capsule by mouth at bedtime ?Patient taking differently: daily as needed. 07/28/21   [name], MD  ?hydrocerin (EUCERIN) CREA Apply 1 application topically 2 (two) times daily. To left foot/leg ?Patient taking differently: Apply 1 application. topically 2 (two) times daily. To left foot/leg as needed 02/17/21   Love, Ivan Anchors, PA-C  ?Insulin Glargine (BASAGLAR KWIKPEN) 100 UNIT/ML Inject 21 Units into the skin 2 (two) times daily.    [provider]  ?methocarbamol (ROBAXIN) 500 MG tablet TAKE 1 TABLET BY MOUTH EVERY 6 HOURS AS NEEDED FOR MUSCLE SPASM 08/03/21   [name], [name], MD  ?oxyCODONE-acetaminophen (PERCOCET/ROXICET) 5-325 MG tablet Take 1 tablet by mouth every 8 (eight) hours as needed for severe pain. 12/06/21   [name], MD  ?pantoprazole (PROTONIX) 40 MG tablet Take 1 tablet by mouth once daily ?Patient taking differently: As needed 08/03/21   [name], [name], MD  ?potassium chloride SA (KLOR-CON) 20 MEQ tablet Take 1 tablet with Torsemide. ?Patient taking differently: as needed. Take 1 tablet with Torsemide. 04/21/21   Joette Catching, PA-C  ?rosuvastatin (CRESTOR) 40 MG tablet Take 1 tablet by mouth once daily 11/28/21   Bensimhon, Shaune Pascal, MD  ?sacubitril-valsartan (ENTRESTO) 97-103 MG Take 1 tablet by mouth 2 (two) times daily. 05/04/21   Rafael Bihari, FNP  ?spironolactone (ALDACTONE) 25 MG tablet Take 1 tablet by mouth once daily 11/28/21   Bensimhon, Shaune Pascal, MD  ?torsemide (DEMADEX) 20 MG tablet Take 1 tablet (20 mg total) by mouth as needed. 04/21/21   Joette Catching, PA-C  ? ? ?Past Medical History: ?Past Medical History:   ?Diagnosis Date  ? Bipolar disorder ([place])   ? CAD (coronary artery disease)   ? a. diffuse 3v CAD by cath 2019, medical therapy recommended.  ? Cataract   ? forming   ? Chronic systolic CHF (congestive heart failure) ([place])   ? CVA (cerebral infarction)   ? No residual deficits  ? Depression   ? PTSD,   ? Diabetes mellitus   ? TYPE II; UNCONTROLLED BY HEMOGLOBIN A1c; STABLE AS  PER DISCHARGE  ? Headache(784.0)   ? Herpes simplex of male genitalia   ? History of colonic polyps   ? Hyperlipidemia   ? Hypertension   ? Myocardial infarction [facility]) 1987  ? (while playing football)  ?  Obesity   ? OSA (obstructive sleep apnea)   ? repeat study 2018 without significant OSA  ? Pneumonia   ? Post-cardiac injury syndrome ([place])   ? History of cardiac injury from blunt trauma  ? Pulmonary hypertension ([place])   ? a. moderately elevated PASP 07/2019.  ? PVCs (premature ventricular contractions)   ? Schizophrenia ([place])   ? Goes to [facility]  ? Sleep apnea   ? Stroke [facility]) 2005  ? some left side weakness  ? Syncope   ? Recurrent, thought to be vasovagal. Also has h/o frequent PVCs.   ? ? ?Past Surgical History: ?Past Surgical History:  ?Procedure Laterality Date  ? AMPUTATION Right 01/25/2021  ? Procedure: RIGHT BELOW KNEE AMPUTATION;  Surgeon: [name], MD;  Location: [place];  Service: Orthopedics;  Laterality: Right;  ? CARDIAC CATHETERIZATION  12/19/10  ? DIFFUSE NONOBSTRUCTIVE CAD; NONISCHEMIC CARDIOMYOPATHY; LEFT VENTRICULAR ANGIOGRAM WAS PERFORMED SECONDARY TO  ELEVATED LEFT VENTRICULAR FILLING PRESSURES  ? COLONOSCOPY    ? ~ age [age]  ? COLONOSCOPY W/ POLYPECTOMY    ? I & D EXTREMITY Bilateral 10/24/2019  ? Procedure: IRRIGATION AND DEBRIDEMENT BILATERAL EXTREMITY WOUND ON FOOT;  Surgeon: Cindra Presume, MD;  Location: [place];  Service: Plastics;  Laterality: Bilateral;  ? METATARSAL HEAD EXCISION Right 08/29/2018  ? Procedure: METATARSAL HEAD RESECTION;  Surgeon: Edrick Kins, DPM;  Location: [place];   Service: Podiatry;  Laterality: Right;  ? METATARSAL OSTEOTOMY Right 08/29/2018  ? Procedure: SUB FIFTHE METATARSIA RIGHT FOOT;  Surgeon: Edrick Kins, DPM;  Location: [place];  Service: Podiatry;  Laterality:

## 2021-12-08 NOTE — Consult Note (Addendum)
?Consult note  ? ? ?Patient: Mitchell [name] [id number] DOB: 1971/05/26 ?DOA: 12/08/2021 ?DOS: the patient was seen and examined on 12/08/2021 ?PCP: [name], [name], MD  ?Patient coming from:  work  - lives with wife and 2 daughters. Uses cane for ambulation.  ? ? ?Chief Complaint: shortness of breath/diaphoresis ? ?HPI: Mitchell [name] is a 51 y.o. male with medical history significant of 3V CAD, systolic CHF, hx of CVA, [id number], HTN, HLD, CKD stage 3, schizophrenia, bipolar disorder who presents to ED with complaints of shortness of breath, diaphoresis and possible near syncope.   He got up this morning with vague shortness of breath and some diaphoresis. He isn't sure if his sugar was low because he doesn't have a glucometer. He didn't eat anything. He took his fluid pill. He went to work and stopped at [employer] for breakfast.  He was in his work parking lot eating breakfast and started to excessively sweat with a coughing spell.  He was coughing so hard he couldn't talk to his HR person so EMS was called. He denies ever losing consciousness or even feeling like he was going to pass out.  When EMS arrived he was awake and alert with stable vital signs. CBG not checked with EMS.  ? ?He denies any leg swelling/fluid retention. Denies weight gain or orthopnea.  ? ? ?He has been feeling good. Denies any fever/chills, vision changes/headaches, chest pain or palpitations, abdominal pain, N/V/D, dysuria or leg swelling.  ? ? ?ER Course:  vitals: afebrile, bp: 131/91, HR: 81, RR: 16, oxygen: 97%RA ?Pertinent labs: hgb: 12.2, potassium: 3.4, creatinine: 1.31 (1.2-1.4), troponin 34>48, bnp: 787 ?CXR: enlarged cardiac silhouette without acute infiltrate/effusion.  ?Ekg unchanged from prior  ?In ED: cardiology consulted.  ? ? ?Review of Systems: As mentioned in the history of present illness. All other systems reviewed and are negative. ?Past Medical History:  ?Diagnosis Date  ? Bipolar disorder ([place])   ? CAD (coronary  artery disease)   ? a. diffuse 3v CAD by cath 2019, medical therapy recommended.  ? Cataract   ? forming   ? Chronic systolic CHF (congestive heart failure) ([place])   ? CVA (cerebral infarction)   ? No residual deficits  ? Depression   ? PTSD,   ? Diabetes mellitus   ? TYPE II; UNCONTROLLED BY HEMOGLOBIN A1c; STABLE AS  PER DISCHARGE  ? Headache(784.0)   ? Herpes simplex of male genitalia   ? History of colonic polyps   ? Hyperlipidemia   ? Hypertension   ? Myocardial infarction [facility]) 1987  ? (while playing football)  ? Obesity   ? OSA (obstructive sleep apnea)   ? repeat study 2018 without significant OSA  ? Pneumonia   ? Post-cardiac injury syndrome ([place])   ? History of cardiac injury from blunt trauma  ? Pulmonary hypertension ([place])   ? a. moderately elevated PASP 07/2019.  ? PVCs (premature ventricular contractions)   ? Schizophrenia ([place])   ? Goes to [facility]  ? Sleep apnea   ? Stroke [facility]) 2005  ? some left side weakness  ? Syncope   ? Recurrent, thought to be vasovagal. Also has h/o frequent PVCs.   ? ?Past Surgical History:  ?Procedure Laterality Date  ? AMPUTATION Right 01/25/2021  ? Procedure: RIGHT BELOW KNEE AMPUTATION;  Surgeon: [name], MD;  Location: [place];  Service: Orthopedics;  Laterality: Right;  ? CARDIAC CATHETERIZATION  12/19/10  ? DIFFUSE NONOBSTRUCTIVE CAD; NONISCHEMIC  CARDIOMYOPATHY; LEFT VENTRICULAR ANGIOGRAM WAS PERFORMED SECONDARY TO  ELEVATED LEFT VENTRICULAR FILLING PRESSURES  ? COLONOSCOPY    ? ~ age [age]  ? COLONOSCOPY W/ POLYPECTOMY    ? I & D EXTREMITY Bilateral 10/24/2019  ? Procedure: IRRIGATION AND DEBRIDEMENT BILATERAL EXTREMITY WOUND ON FOOT;  Surgeon: Cindra Presume, MD;  Location: [place];  Service: Plastics;  Laterality: Bilateral;  ? METATARSAL HEAD EXCISION Right 08/29/2018  ? Procedure: METATARSAL HEAD RESECTION;  Surgeon: Edrick Kins, DPM;  Location: [place];  Service: Podiatry;  Laterality: Right;  ? METATARSAL OSTEOTOMY Right 08/29/2018  ?  Procedure: SUB FIFTHE METATARSIA RIGHT FOOT;  Surgeon: Edrick Kins, DPM;  Location: [place];  Service: Podiatry;  Laterality: Right;  ? MULTIPLE EXTRACTIONS WITH ALVEOLOPLASTY  01/27/2014  ? "all my teeth; 4 Quadrants of alveoloplasty  ? MULTIPLE EXTRACTIONS WITH ALVEOLOPLASTY N/A 01/27/2014  ? Procedure: EXTRACTION OF TEETH #'1, 2, 3, 4, 5, 6, 7, 8, 9, 10, 11, 12, 13, 14, 15, 16, 17, 20, 21, 22, 23, 24, 25, 26, 27, 28, 29, 31 and 32 WITH ALVEOLOPLASTY;  Surgeon: Lenn Cal, DDS;  Location: [place];  Service: Oral Surgery;  Laterality: N/A;  ? ORIF FINGER / THUMB FRACTURE Right   ? POLYPECTOMY    ? RIGHT HEART CATH N/A 01/27/2021  ? Procedure: RIGHT HEART CATH;  Surgeon: Jolaine Artist, MD;  Location: [place] [facility];  Service: Cardiovascular;  Laterality: N/A;  ? RIGHT/LEFT HEART CATH AND CORONARY ANGIOGRAPHY N/A 09/26/2017  ? Procedure: RIGHT/LEFT HEART CATH AND CORONARY ANGIOGRAPHY;  Surgeon: Jolaine Artist, MD;  Location: [place] [facility];  Service: Cardiovascular;  Laterality: N/A;  ? RIGHT/LEFT HEART CATH AND CORONARY ANGIOGRAPHY N/A 04/12/2021  ? Procedure: RIGHT/LEFT HEART CATH AND CORONARY ANGIOGRAPHY;  Surgeon: Jolaine Artist, MD;  Location: [place] [facility];  Service: Cardiovascular;  Laterality: N/A;  ? SKIN SPLIT GRAFT Bilateral 10/24/2019  ? Procedure: SKIN GRAFT SPLIT THICKNESS LEFT THIGH;  Surgeon: Cindra Presume, MD;  Location: [place];  Service: Plastics;  Laterality: Bilateral;  ? WOUND DEBRIDEMENT Right 08/29/2018  ? Procedure: Debridement of ulcer on right fifth metatarsal;  Surgeon: Edrick Kins, DPM;  Location: [place];  Service: Podiatry;  Laterality: Right;  ? ?Social History:  reports that he has never smoked. He has never used smokeless tobacco. He reports that he does not drink alcohol and does not use drugs. ? ?Allergies  ?Allergen Reactions  ? Nsaids Anaphylaxis and Other (See Comments)  ?  Able to tolerate aspirin   ? Trulicity [Dulaglutide] Nausea Only  ? ? ?Family  History  ?Problem Relation Age of Onset  ? Heart disease Mother   ?     MI  ? Heart failure Mother   ? Diabetes Mother   ?     ALSO IN MOST OF HIS SIBLINGS; 2 UNLCES HAVE ALSO PASSED AWAY FROM DM  ? Cardiomyopathy Mother   ? Cancer - Ovarian Mother   ? Ovarian cancer Mother   ? Heart disease Father   ? Hypertension Father   ? Diabetes Father   ? Diabetes Sister   ? Diabetes Brother   ? Colon cancer Paternal Uncle   ? Colon cancer Paternal Uncle   ? Colon polyps Neg Hx   ? Esophageal cancer Neg Hx   ? Rectal cancer Neg Hx   ? Stomach cancer Neg Hx   ? ? ?Prior to Admission medications   ?Medication Sig Start  Date End Date Taking? Authorizing Provider  ?amitriptyline (ELAVIL) 10 MG tablet TAKE 1 TABLET BY MOUTH AT BEDTIME 09/06/21   [name], [name], MD  ?ascorbic acid (VITAMIN C) 250 MG tablet Take 1 tablet (250 mg total) by mouth daily. 02/18/21   Bary Leriche, PA-C  ?aspirin 81 MG EC tablet Take 1 tablet (81 mg total) by mouth daily. 04/13/21   Danford, Suann Larry, MD  ?carvedilol (COREG) 12.5 MG tablet Take 1.5 tablets (18.75 mg total) by mouth 2 (two) times daily with a meal. 06/01/21   Milford, Maricela Bo, FNP  ?cholecalciferol (VITAMIN D3) 25 MCG (1000 UNIT) tablet Take 1,000 Units by mouth daily.    [provider]  ?clopidogrel (PLAVIX) 75 MG tablet Take 1 tablet by mouth once daily 11/28/21   Bensimhon, Shaune Pascal, MD  ?dapagliflozin propanediol (FARXIGA) 10 MG TABS tablet Take 1 tablet (10 mg total) by mouth daily before breakfast. 05/04/21   Milford, Maricela Bo, FNP  ?diclofenac Sodium (VOLTAREN) 1 % GEL Apply 4 g topically 4 (four) times daily. ?Patient taking differently: Apply 4 g topically 4 (four) times daily. As needed 09/23/21   [name], DO  ?gabapentin (NEURONTIN) 100 MG capsule Take 1 capsule by mouth at bedtime ?Patient taking differently: daily as needed. 07/28/21   [name], MD  ?hydrocerin (EUCERIN) CREA Apply 1 application topically 2 (two) times daily. To left foot/leg ?Patient  taking differently: Apply 1 application. topically 2 (two) times daily. To left foot/leg as needed 02/17/21   Love, Ivan Anchors, PA-C  ?Insulin Glargine (BASAGLAR KWIKPEN) 100 UNIT/ML Inject 21 Units into the skin 2

## 2021-12-08 NOTE — Assessment & Plan Note (Addendum)
A1C of 7.8 in 07/2021 ?Continue long acting insulin, basaglar 21 units ?Continue farxiga ?Placed SW consult for glucometer, if not done in ED, discussed he could call PCP today to get that sent in for him.  ?

## 2021-12-08 NOTE — Assessment & Plan Note (Addendum)
51 year old male who had episode of shortness of breath and diaphoresis this AM and then an event of coughing and diaphoresis in car while eating at his work that EMS was called for, he denies every having any symptoms of passing out, chest pain, palpitations, lightheaded or dizziness, does not sound like pre syncopal event. Denies any cardiac symptoms. troponin flat and ekg unchanged. He has no clinical evidence of volume overload to suggest CHF exacerbation. He has been approved for ICD and needs to touch base with Dr. Lovena Le for scheduling this. Dr. Aundra Dubin to see today and has cleared him to go home from cardiac standpoint. Will place 14 day ziopatch. No other medical indication to keep in hospital and discussed with him he can go home with outpatient f/u as directed by cardiology.  ?

## 2021-12-08 NOTE — Assessment & Plan Note (Signed)
Check magnesium ?Replete orally x 1 ?Trend  ?

## 2021-12-08 NOTE — ED Triage Notes (Signed)
Patient presents to ed via [facility] states he was at work started having chest pain with sob, upon ems arrival patient was cool diaphoretic.  C/o cough. States he takes Torsemide as needed hasn't taken in 1 week. States he took the dose this am. History of cardiac stents.   ?

## 2021-12-08 NOTE — Assessment & Plan Note (Addendum)
Mixed ischemic/nonischemic heart failure ?Last echo: 08/2021 with EF of 30-35%. Moderately decreased LVF and grade 2 DD.  ?CXR with no evidence of volume overload and clinically looks euvolemic with no evidence of exacerbation  ?Continue entresto, coreg, aldactone ?Takes demadex on prn basis  ?

## 2021-12-08 NOTE — ED Provider Notes (Addendum)
?[facility] ?Provider Note ? ? ?CSN: [id number] ?Arrival date & time: 12/08/21  [id number] ? ?  ? ?History ? ?Chief Complaint  ?Patient presents with  ? Shortness of Breath  ? Chest Pain  ? ? ?Mitchell [name] is a 51 y.o. male. ? ?HPI ?Patient reports he is going to "blame this one on his wife".  He reports she had the air conditioning on and the fan all night.  He got up in the morning to go to work he reports he felt a little bit of shortness of breath and got slightly sweaty.  He denies he had any chest pain.  Denies he had chest pain through the night or was awakened feeling short of breath during the night.  Patient reports that he typically attributes getting sweaty to low blood sugar.  He does not have test strips to check his blood sugar this morning.  Due to this sweatiness (which he reports always precedes low blood sugar) he did not take his insulin this morning.  He reports due to the symptoms he did take his torsemide and Lasix.  He reports at baseline his instructions are to take torsemide and Lasix on an as-needed basis.  Since he did feel slightly short of breath and was some "very mild tightness in the chest" he opted to take the diuretics.  However, he reports he does not think that his fluid buildup is very bad because he is not having any trouble taking deep breaths or lying flat.  Patient reports he went ahead and drove to work and once he got to work he started getting a lot more sweaty and then ended up losing consciousness in the car.  He was found by coworkers and EMS was contacted. ?  ? ?Home Medications ?Prior to Admission medications   ?Medication Sig Start Date End Date Taking? Authorizing Provider  ?amitriptyline (ELAVIL) 10 MG tablet TAKE 1 TABLET BY MOUTH AT BEDTIME 09/06/21   [name], [name], MD  ?ascorbic acid (VITAMIN C) 250 MG tablet Take 1 tablet (250 mg total) by mouth daily. 02/18/21   Bary Leriche, PA-C  ?aspirin 81 MG EC tablet Take 1 tablet  (81 mg total) by mouth daily. 04/13/21   Danford, Suann Larry, MD  ?carvedilol (COREG) 12.5 MG tablet Take 1.5 tablets (18.75 mg total) by mouth 2 (two) times daily with a meal. 06/01/21   Milford, Maricela Bo, FNP  ?cholecalciferol (VITAMIN D3) 25 MCG (1000 UNIT) tablet Take 1,000 Units by mouth daily.    [provider]  ?clopidogrel (PLAVIX) 75 MG tablet Take 1 tablet by mouth once daily 11/28/21   Bensimhon, Shaune Pascal, MD  ?dapagliflozin propanediol (FARXIGA) 10 MG TABS tablet Take 1 tablet (10 mg total) by mouth daily before breakfast. 05/04/21   Milford, Maricela Bo, FNP  ?diclofenac Sodium (VOLTAREN) 1 % GEL Apply 4 g topically 4 (four) times daily. ?Patient taking differently: Apply 4 g topically 4 (four) times daily. As needed 09/23/21   [name], DO  ?gabapentin (NEURONTIN) 100 MG capsule Take 1 capsule by mouth at bedtime ?Patient taking differently: daily as needed. 07/28/21   [name], MD  ?hydrocerin (EUCERIN) CREA Apply 1 application topically 2 (two) times daily. To left foot/leg ?Patient taking differently: Apply 1 application. topically 2 (two) times daily. To left foot/leg as needed 02/17/21   Love, Ivan Anchors, PA-C  ?Insulin Glargine (BASAGLAR KWIKPEN) 100 UNIT/ML Inject 21 Units into the skin 2 (two)  times daily.    [provider]  ?methocarbamol (ROBAXIN) 500 MG tablet TAKE 1 TABLET BY MOUTH EVERY 6 HOURS AS NEEDED FOR MUSCLE SPASM 08/03/21   [name], Drema Pry, MD  ?oxyCODONE-acetaminophen (PERCOCET/ROXICET) 5-325 MG tablet Take 1 tablet by mouth every 8 (eight) hours as needed for severe pain. 12/06/21   Nadara Mustard, MD  ?pantoprazole (PROTONIX) 40 MG tablet Take 1 tablet by mouth once daily ?Patient taking differently: As needed 08/03/21   [name], Drema Pry, MD  ?potassium chloride SA (KLOR-CON) 20 MEQ tablet Take 1 tablet with Torsemide. ?Patient taking differently: as needed. Take 1 tablet with Torsemide. 04/21/21   Andrey Farmer, PA-C  ?rosuvastatin (CRESTOR) 40 MG  tablet Take 1 tablet by mouth once daily 11/28/21   Bensimhon, [name], MD  ?sacubitril-valsartan (ENTRESTO) 97-103 MG Take 1 tablet by mouth 2 (two) times daily. 05/04/21   Jacklynn Ganong, FNP  ?spironolactone (ALDACTONE) 25 MG tablet Take 1 tablet by mouth once daily 11/28/21   Bensimhon, [name], MD  ?torsemide (DEMADEX) 20 MG tablet Take 1 tablet (20 mg total) by mouth as needed. 04/21/21   Andrey Farmer, PA-C  ?   ? ?Allergies    ?Nsaids and Trulicity [dulaglutide]   ? ?Review of Systems   ?Review of Systems ?10 systems reviewed and negative except as per HPI ?Physical Exam ?Updated Vital Signs ?BP (!) 153/109   Pulse 77   Temp 98.4 ?F (36.9 ?C) (Oral)   Resp 18   Ht 5' 11.5" (1.816 m)   Wt 131.1 kg   SpO2 97%   BMI 39.75 kg/m?  ?Physical Exam ?Constitutional:   ?   Comments: Alert nontoxic no acute distress.  Respirations nonlabored  ?HENT:  ?   Mouth/Throat:  ?   Pharynx: Oropharynx is clear.  ?Eyes:  ?   Extraocular Movements: Extraocular movements intact.  ?Cardiovascular:  ?   Rate and Rhythm: Normal rate and regular rhythm.  ?Pulmonary:  ?   Effort: Pulmonary effort is normal.  ?   Breath sounds: Normal breath sounds.  ?Abdominal:  ?   General: There is no distension.  ?   Palpations: Abdomen is soft.  ?   Tenderness: There is no abdominal tenderness. There is no guarding.  ?Musculoskeletal:  ?   Comments: Wearing prosthesis on the right for BKA.  Left lower extremity no edema no calf tenderness  ?Skin: ?   General: Skin is warm and dry.  ?Neurological:  ?   General: No focal deficit present.  ?   Mental Status: He is oriented to person, place, and time.  ?   Coordination: Coordination normal.  ?Psychiatric:     ?   Mood and Affect: Mood normal.  ? ? ?ED Results / Procedures / Treatments   ?Labs ?(all labs ordered are listed, but only abnormal results are displayed) ?Labs Reviewed  ?COMPREHENSIVE METABOLIC PANEL - Abnormal; Notable for the following components:  ?    Result Value  ?  Potassium 3.4 (*)   ? Glucose, Bld 218 (*)   ? Creatinine, Ser 1.31 (*)   ? All other components within normal limits  ?CBC WITH DIFFERENTIAL/PLATELET - Abnormal; Notable for the following components:  ? RBC 4.12 (*)   ? Hemoglobin 12.2 (*)   ? HCT 38.4 (*)   ? Platelets 135 (*)   ? All other components within normal limits  ?URINALYSIS, ROUTINE W REFLEX MICROSCOPIC - Abnormal; Notable for the following components:  ?  Color, Urine COLORLESS (*)   ? Glucose, UA >=500 (*)   ? Hgb urine dipstick SMALL (*)   ? Bacteria, UA RARE (*)   ? All other components within normal limits  ?BRAIN NATRIURETIC PEPTIDE - Abnormal; Notable for the following components:  ? B Natriuretic Peptide 787.9 (*)   ? All other components within normal limits  ?CBG MONITORING, ED - Abnormal; Notable for the following components:  ? Glucose-Capillary 207 (*)   ? All other components within normal limits  ?TROPONIN I (HIGH SENSITIVITY) - Abnormal; Notable for the following components:  ? Troponin I (High Sensitivity) 34 (*)   ? All other components within normal limits  ?TROPONIN I (HIGH SENSITIVITY) - Abnormal; Notable for the following components:  ? Troponin I (High Sensitivity) 48 (*)   ? All other components within normal limits  ? ? ?EKG ?EKG Interpretation ? ?Date/Time:  Thursday Dec 08 2021 08:12:55 EDT ?Ventricular Rate:  96 ?PR Interval:  182 ?QRS Duration: 130 ?QT Interval:  414 ?QTC Calculation: 524 ?R Axis:   -53 ?Text Interpretation: Sinus rhythm Atrial premature complex Left bundle branch block old LBBB. no sig change from previous Confirmed by [name] [phone]) on 12/08/2021 11:17:23 AM ? ?Radiology ?DG Chest Port 1 View ? ?Result Date: 12/08/2021 ?CLINICAL DATA:  Shortness of breath, chest pain, history CHF, diabetes mellitus, hypertension, MI EXAM: PORTABLE CHEST 1 VIEW COMPARISON:  Portable exam 0907 hours compared to 04/11/2021 FINDINGS: Enlargement of cardiac silhouette. Mediastinal contours and pulmonary vascularity  normal. Lungs clear. No infiltrate, pleural effusion, or pneumothorax. IMPRESSION: Enlargement of cardiac silhouette without acute infiltrate. Electronically Signed   By: Lavonia Dana M.D.   On: 12/08/2021 09:14   ? ?Procedu

## 2021-12-09 ENCOUNTER — Other Ambulatory Visit (HOSPITAL_COMMUNITY): Payer: Self-pay

## 2021-12-11 ENCOUNTER — Other Ambulatory Visit: Payer: Self-pay | Admitting: Physical Medicine and Rehabilitation

## 2021-12-12 ENCOUNTER — Telehealth: Payer: Self-pay | Admitting: Internal Medicine

## 2021-12-12 NOTE — Telephone Encounter (Signed)
Pt needs call back to schedule a defibrillator appt. York Spaniel he was given dates to pick from and would like for it to be on the May 30th date.  ?

## 2021-12-13 NOTE — Telephone Encounter (Signed)
?  Pt is calling back to f/u. He is requesting to get a call back today ?

## 2021-12-14 NOTE — Telephone Encounter (Signed)
Returned call to Pt. ? ?Pt scheduled for ICD implant on January 13, 2022 at 9:30 am. ? 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 ?Work up complete. ? ?Instruction letter in Pt's chart under "letters". ? ?

## 2021-12-20 NOTE — Progress Notes (Signed)
ADVANCED HF CLINIC NOTE  PCP: [name], MD HF Cardiologist: Dr. [name]  HPI: Mitchell [name] is a 51 y.o.male with multiple medical problems HFrEF, mixed ischemic/nonischemic CM, 3v CAD, obesity, hypertension, type II DM, obstructive sleep apnea, bipolar disease/schizophrenia, prior CVA.    Had [facility] on 09/26/17 with 3v disease and preserved CO/CI. No revascularization options. Saw EP about ICD but did not f/u.   Patient admitted June 2022 with sepsis secondary to RLE abscess/cellulitis and COVID-19. He ended up requiring R BKA.  Readmitted 04/11/21 with a/c HFrEF and NSTEMI.  He was diuresed with IV lasix and started on GDMT. L/RHC with severe 3 vessel CAD (no targets for intervention), RA 4, PA 49/17 (31), PCW 15, Fick CO/index 6.7/2.7. He was referred to paramedicine program at discharge. Visual impairment felt to be contributor to issues with medication compliance.  Since his last hospitalization, we have been titrating GDMT.   Echo 2/23, EF 30-35%, RV ok. IVC not dilated.   He was treated for simple UTI. Seen at urgent care last week, 2/1,6 for LB/flank pain. UA +. CT w/o evidence pyelonephritis. No kidney stone.   He was seen by Dr Lovena Le on 11/22/21 for possible ICD. He is waiting [name] to call him back.  . Today he returns for HF follow up. Overall feeling fine. Denies SOB/PND/Orthopnea. Appetite ok. No fever or chills. Weight at home 279-280 pounds. Taking all medications. He takes torsemide every few weeks. He works for [employer].    CARDIAC TESTING: R/LHC (9/22):   Prox LAD to Mid LAD lesion is 80% stenosed.   Dist LAD lesion is 99% stenosed.   Dist RCA lesion is 90% stenosed.   Ost 1st Mrg lesion is 95% stenosed.   1st Mrg lesion is 99% stenosed.   Ost 2nd Mrg to 2nd Mrg lesion is 99% stenosed.   Ost RPDA to RPDA lesion is 90% stenosed.   RPAV lesion is 95% stenosed.   Mid LM lesion is 20% stenosed.  Findings: Ao = 131/84 (107) LV = 131/22 RA  = 4 RV = 49/11 PA = 49/17 (31) PCW = 15 Fick cardiac output/index = 6.7/2.7 PVR = 2.4 FA sat = 95% PA sat = 56%, 63%  Assessment: Severe 3v diffuse CAD with moderate progression from previous Ischemic CM EF 25% Mild pulmonary venous HTN with preserved cardiac output   - Echo (6/22): EF 20-25%, mild LVH, trivial MR  ROS: All systems negative except as listed in HPI, PMH and Problem List.  SH:  Social History   Socioeconomic History   Marital status: Married    Spouse name: Not on file   Number of children: 5   Years of education: Not on file   Highest education level: Not on file  Occupational History   Occupation: Mortician  Tobacco Use   Smoking status: Never   Smokeless tobacco: Never  Vaping Use   Vaping Use: Never used  Substance and Sexual Activity   Alcohol use: No   Drug use: No   Sexual activity: Not on file  Other Topics Concern   Not on file  Social History Narrative   MARRIED, [place]; 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   Social Determinants of Health   Financial Resource Strain: Medium Risk   Difficulty of Paying Living Expenses: Somewhat hard  Food Insecurity: No Food Insecurity  Worried About Charity fundraiser in the Last Year: Never true   [place] in the Last Year: Never true  Transportation Needs: No Transportation Needs   Lack of Transportation (Medical): No   Lack of Transportation (Non-Medical): No  Physical Activity: Not on file  Stress: Not on file  Social Connections: Not on file  Intimate Partner Violence: Not on file    FH:  Family History  Problem Relation Age of Onset   Heart disease Mother        MI   Heart failure Mother    Diabetes Mother        ALSO IN MOST OF HIS SIBLINGS; 2 UNLCES HAVE ALSO PASSED AWAY FROM DM   Cardiomyopathy Mother    Cancer - Ovarian Mother    Ovarian cancer Mother    Heart disease Father     Hypertension Father    Diabetes Father    Diabetes Sister    Diabetes Brother    Colon cancer Paternal Uncle    Colon cancer Paternal Uncle    Colon polyps Neg Hx    Esophageal cancer Neg Hx    Rectal cancer Neg Hx    Stomach cancer Neg Hx     Past Medical History:  Diagnosis Date   Bipolar disorder ([place])    CAD (coronary artery disease)    a. diffuse 3v CAD by cath 2019, medical therapy recommended.   Cataract    forming    Chronic systolic CHF (congestive heart failure) (HCC)    CVA (cerebral infarction)    No residual deficits   Depression    PTSD,    Diabetes mellitus    TYPE II; UNCONTROLLED BY HEMOGLOBIN A1c; STABLE AS  PER DISCHARGE   Headache(784.0)    Herpes simplex of male genitalia    History of colonic polyps    Hyperlipidemia    Hypertension    Myocardial infarction ([place]) 1987   (while playing football)   Obesity    OSA (obstructive sleep apnea)    repeat study 2018 without significant OSA   Pneumonia    Post-cardiac injury syndrome ([place])    History of cardiac injury from blunt trauma   Pulmonary hypertension ([place])    a. moderately elevated PASP 07/2019.   PVCs (premature ventricular contractions)    Schizophrenia ([place])    Goes to [facility]   Sleep apnea    Stroke [facility]) 2005   some left side weakness   Syncope    Recurrent, thought to be vasovagal. Also has h/o frequent PVCs.     Current Outpatient Medications  Medication Sig Dispense Refill   Accu-Chek Softclix Lancets lancets Use to test blood sugars up to 4 times daily as needed. 100 each 0   amitriptyline (ELAVIL) 10 MG tablet TAKE 1 TABLET BY MOUTH AT BEDTIME 30 tablet 0   ascorbic acid (VITAMIN C) 250 MG tablet Take 1 tablet (250 mg total) by mouth daily. 30 tablet 0   aspirin 81 MG EC tablet Take 1 tablet (81 mg total) by mouth daily. 30 tablet 12   Blood Glucose Monitoring Suppl (BLOOD GLUCOSE MONITOR SYSTEM) w/Device KIT Use to test blood sugar up to 4 times daily as  needed. 1 kit 0   carvedilol (COREG) 12.5 MG tablet Take 1.5 tablets (18.75 mg total) by mouth 2 (two) times daily with a meal. 90 tablet 5   cholecalciferol (VITAMIN D3) 25 MCG (1000 UNIT) tablet Take 1,000 Units by  mouth daily.     clopidogrel (PLAVIX) 75 MG tablet Take 1 tablet by mouth once daily 30 tablet 0   dapagliflozin propanediol (FARXIGA) 10 MG TABS tablet Take 1 tablet (10 mg total) by mouth daily before breakfast. 30 tablet 11   diclofenac Sodium (VOLTAREN) 1 % GEL Apply 4 g topically 4 (four) times daily. (Patient taking differently: Apply 4 g topically 4 (four) times daily. As needed) 100 g 0   gabapentin (NEURONTIN) 100 MG capsule Take 1 capsule by mouth at bedtime (Patient taking differently: daily as needed.) 30 capsule 0   glucose blood (ACCU-CHEK GUIDE) test strip Use to test blood sugars up to 4 times daily as needed. 100 each 0   hydrocerin (EUCERIN) CREA Apply 1 application topically 2 (two) times daily. To left foot/leg (Patient taking differently: Apply 1 application. topically 2 (two) times daily. To left foot/leg as needed) 454 g 0   Insulin Glargine (BASAGLAR KWIKPEN) 100 UNIT/ML Inject 21 Units into the skin 2 (two) times daily.     methocarbamol (ROBAXIN) 500 MG tablet TAKE 1 TABLET BY MOUTH EVERY 6 HOURS AS NEEDED FOR MUSCLE SPASM 60 tablet 0   oxyCODONE-acetaminophen (PERCOCET/ROXICET) 5-325 MG tablet Take 1 tablet by mouth every 8 (eight) hours as needed for severe pain. 20 tablet 0   pantoprazole (PROTONIX) 40 MG tablet Take 1 tablet by mouth once daily (Patient taking differently: As needed) 30 tablet 0   potassium chloride SA (KLOR-CON) 20 MEQ tablet Take 1 tablet with Torsemide. (Patient taking differently: as needed. Take 1 tablet with Torsemide.) 15 tablet 3   rosuvastatin (CRESTOR) 40 MG tablet Take 1 tablet by mouth once daily 30 tablet 0   sacubitril-valsartan (ENTRESTO) 97-103 MG Take 1 tablet by mouth 2 (two) times daily. 60 tablet 11   spironolactone  (ALDACTONE) 25 MG tablet Take 1 tablet by mouth once daily 30 tablet 0   torsemide (DEMADEX) 20 MG tablet Take 1 tablet (20 mg total) by mouth as needed. 15 tablet 3   No current facility-administered medications for this visit.   Wt Readings from Last 3 Encounters:  12/08/21 131.1 kg (289 lb)  11/29/21 127.3 kg (280 lb 9.6 oz)  11/22/21 126.6 kg (279 lb)   There were no vitals taken for this visit.  PHYSICAL EXAM: General:  Well appearing. No resp difficulty HEENT: normal Neck: supple. no JVD. Carotids 2+ bilat; no bruits. No lymphadenopathy or thryomegaly appreciated. Cor: PMI nondisplaced. Regular rate & rhythm. No rubs, gallops or murmurs. Lungs: clear Abdomen: soft, nontender, nondistended. No hepatosplenomegaly. No bruits or masses. Good bowel sounds. Extremities: no cyanosis, clubbing, rash, edema. R BKA Neuro: alert & orientedx3, cranial nerves grossly intact. moves all 4 extremities w/o difficulty. Affect pleasant   ASSESSMENT & PLAN:  Chronic systolic HF /Mixed ischemic/nonischemic cardiomyopathy  - Echo (6/22): EF 20 to 25%, stable from prior exams dating back to 37  - R/LHC (9/22): Severe 3v CAD (no targets for revascularization), mild pulm venous HTN with preserved CO. -Echo  08/2021 EF 30-35%, RV normal. IVC not dilated  - He has LBBB but QRS <150 ms. Not candidate for CRT-D  - BP elevated but has not yet taken meds today   - Continue Coreg 18.75 mg bid. - Continue Farxiga 10 mg daily.  - Continue spiro 25 mg daily. - Continue Enstesto 97/103 mg bid. - Continue torsemide 20 mg as needed.    2. CAD/NSTEMI 09/22 - Cath with progressive 3v CAD as above. No targets  for intervention.  - No chest pain.  - Continue DAPT with aspirin + plavix, statin and beta blocker.    3. Hypertension - Stable  - continue GDMT per above   4. Hyperlipidemia  - Continue statin, Crestor 40 mg    5. Type 2 diabetes - Last A1c 7.8  - Continue Farxiga. - On insulin, management  per PCP.   6. PVD s/p R BKA  - Diabetic foot wound complicated by sepsis that led to amputation on 01/25/2021 - Continue aspirin and statin.   7. Simple UTI No further issues   Follow up in 3 months.   Rafael Bihari, NP-C  12/20/21  Patient seen and examined with the above-signed Advanced Practice Provider and/or Housestaff. I personally reviewed laboratory data, imaging studies and relevant notes. I independently examined the patient and formulated the important aspects of the plan. I have edited the note to reflect any of my changes or salient points. I have personally discussed the plan with the patient and/or family.  Overall doing well. Denies CP, SOB, orthopnea or PND. Compliant with meds. Edema under control.   General:  Sitting in clinic  No resp difficulty HEENT: normal Neck: supple. no JVD. Carotids 2+ bilat; no bruits. No lymphadenopathy or thryomegaly appreciated. Cor: PMI nondisplaced. Regular rate & rhythm. No rubs, gallops or murmurs. Lungs: clear Abdomen: obese soft, nontender, nondistended. No hepatosplenomegaly. No bruits or masses. Good bowel sounds. Extremities: no cyanosis, clubbing, rash, tr edema on left  s/p R BKA Neuro: alert & orientedx3, cranial nerves grossly intact. moves all 4 extremities w/o difficulty. Affect pleasant  Volume status looks good. On good GDMT. We discussed possible ICD. Will repeat echo in several months than can refer to EP.Follow BP.   Rafael Bihari, FNP  2:54 PM

## 2021-12-21 ENCOUNTER — Ambulatory Visit: Payer: No Typology Code available for payment source | Admitting: Endocrinology

## 2021-12-21 ENCOUNTER — Telehealth (HOSPITAL_COMMUNITY): Payer: Self-pay

## 2021-12-21 NOTE — Telephone Encounter (Signed)
Called to confirm/remind patient of their appointment at the [facility] on 12/22/21.   Patient reminded to bring all medications and/or complete list.  Confirmed patient has transportation. Gave directions, instructed to utilize valet parking.  Confirmed appointment prior to ending call.   

## 2021-12-22 ENCOUNTER — Encounter (HOSPITAL_COMMUNITY): Payer: Self-pay

## 2021-12-22 ENCOUNTER — Telehealth (HOSPITAL_COMMUNITY): Payer: Self-pay | Admitting: Licensed Clinical Social Worker

## 2021-12-22 ENCOUNTER — Ambulatory Visit (HOSPITAL_COMMUNITY)
Admission: RE | Admit: 2021-12-22 | Discharge: 2021-12-22 | Disposition: A | Discharge status: Home / Self Care | Payer: No Typology Code available for payment source | Source: Ambulatory Visit | Attending: Family Medicine | Admitting: Family Medicine

## 2021-12-22 ENCOUNTER — Ambulatory Visit (INDEPENDENT_AMBULATORY_CARE_PROVIDER_SITE_OTHER): Payer: No Typology Code available for payment source | Admitting: Orthopedic Surgery

## 2021-12-22 VITALS — BP 148/98 | HR 76 | Wt 278.0 lb

## 2021-12-22 DIAGNOSIS — I739 Peripheral vascular disease, unspecified: Secondary | ICD-10-CM | POA: Diagnosis not present

## 2021-12-22 DIAGNOSIS — I428 Other cardiomyopathies: Secondary | ICD-10-CM | POA: Insufficient documentation

## 2021-12-22 DIAGNOSIS — I493 Ventricular premature depolarization: Secondary | ICD-10-CM | POA: Diagnosis not present

## 2021-12-22 DIAGNOSIS — I2510 Atherosclerotic heart disease of native coronary artery without angina pectoris: Secondary | ICD-10-CM | POA: Insufficient documentation

## 2021-12-22 DIAGNOSIS — Z89511 Acquired absence of right leg below knee: Secondary | ICD-10-CM | POA: Diagnosis not present

## 2021-12-22 DIAGNOSIS — I10 Essential (primary) hypertension: Secondary | ICD-10-CM

## 2021-12-22 DIAGNOSIS — E785 Hyperlipidemia, unspecified: Secondary | ICD-10-CM | POA: Insufficient documentation

## 2021-12-22 DIAGNOSIS — E1169 Type 2 diabetes mellitus with other specified complication: Secondary | ICD-10-CM

## 2021-12-22 DIAGNOSIS — E782 Mixed hyperlipidemia: Secondary | ICD-10-CM

## 2021-12-22 DIAGNOSIS — I110 Hypertensive heart disease with heart failure: Secondary | ICD-10-CM | POA: Diagnosis not present

## 2021-12-22 DIAGNOSIS — Z8673 Personal history of transient ischemic attack (TIA), and cerebral infarction without residual deficits: Secondary | ICD-10-CM | POA: Insufficient documentation

## 2021-12-22 DIAGNOSIS — I447 Left bundle-branch block, unspecified: Secondary | ICD-10-CM | POA: Insufficient documentation

## 2021-12-22 DIAGNOSIS — Z794 Long term (current) use of insulin: Secondary | ICD-10-CM

## 2021-12-22 DIAGNOSIS — I998 Other disorder of circulatory system: Secondary | ICD-10-CM | POA: Insufficient documentation

## 2021-12-22 DIAGNOSIS — E118 Type 2 diabetes mellitus with unspecified complications: Secondary | ICD-10-CM | POA: Insufficient documentation

## 2021-12-22 DIAGNOSIS — I5022 Chronic systolic (congestive) heart failure: Secondary | ICD-10-CM | POA: Diagnosis not present

## 2021-12-22 LAB — CBC
HCT: 39.5 % (ref 39.0–52.0)
Hemoglobin: 12.5 g/dL — ABNORMAL LOW (ref 13.0–17.0)
MCH: 29.7 pg (ref 26.0–34.0)
MCHC: 31.6 g/dL (ref 30.0–36.0)
MCV: 93.8 fL (ref 80.0–100.0)
Platelets: 129 10*3/uL — ABNORMAL LOW (ref 150–400)
RBC: 4.21 MIL/uL — ABNORMAL LOW (ref 4.22–5.81)
RDW: 12.8 % (ref 11.5–15.5)
WBC: 8 10*3/uL (ref 4.0–10.5)
nRBC: 0 % (ref 0.0–0.2)

## 2021-12-22 LAB — BASIC METABOLIC PANEL
Anion gap: 7 (ref 5–15)
BUN: 23 mg/dL — ABNORMAL HIGH (ref 6–20)
CO2: 27 mmol/L (ref 22–32)
Calcium: 8.9 mg/dL (ref 8.9–10.3)
Chloride: 107 mmol/L (ref 98–111)
Creatinine, Ser: 1.44 mg/dL — ABNORMAL HIGH (ref 0.61–1.24)
GFR, Estimated: 58 mL/min — ABNORMAL LOW (ref >60)
Glucose, Bld: 149 mg/dL — ABNORMAL HIGH (ref 70–99)
Potassium: 5.1 mmol/L (ref 3.5–5.1)
Sodium: 141 mmol/L (ref 135–145)

## 2021-12-22 NOTE — Telephone Encounter (Signed)
CSW completed physician portion of SCAT application and sent in for review  [name], LCSW Clinical Social Worker [facility] Desk#: [phone] Cell#: [phone]

## 2021-12-22 NOTE — Patient Instructions (Addendum)
It was great to see you today! No medication changes are needed at this time.  Labs today We will only contact you if something comes back abnormal or we need to make some changes. Otherwise no news is good news!  Please see pre procedure (ICD implant) packet including instructions letter and prep wash. Please call Dr Tanna Furry  office for any questions at  [phone] or send a MyChart message.  Please keep follow up as scheduled  in the Advanced Practitioners (PA/NP) Clinic    Do the following things EVERYDAY: Weigh yourself in the morning before breakfast. Write it down and keep it in a log. Take your medicines as prescribed Eat low salt foods--Limit salt (sodium) to 2000 mg per day.  Stay as active as you can everyday Limit all fluids for the day to less than 2 liters  At the [facility], you and your health needs are our priority. As part of our continuing mission to provide you with exceptional heart care, we have created designated Provider Care Teams. These Care Teams include your primary Cardiologist (physician) and Advanced Practice Providers (APPs- Physician Assistants and Nurse Practitioners) who all work together to provide you with the care you need, when you need it.   You may see any of the following providers on your designated Care Team at your next follow up: Dr Glori Bickers Dr Haynes Kerns, NP Lyda Jester, [place] [facility] [place], [place] Audry Riles, PharmD   Please be sure to bring in all your medications bottles to every appointment.

## 2021-12-27 ENCOUNTER — Telehealth (HOSPITAL_COMMUNITY): Payer: Self-pay

## 2021-12-27 ENCOUNTER — Encounter: Payer: Self-pay | Admitting: Orthopedic Surgery

## 2021-12-27 DIAGNOSIS — I5022 Chronic systolic (congestive) heart failure: Secondary | ICD-10-CM

## 2021-12-27 NOTE — Addendum Note (Signed)
Encounter addended by: Flavia Shipper on: 12/27/2021 4:40 PM  Actions taken: Imaging Exam ended

## 2021-12-27 NOTE — Telephone Encounter (Signed)
Patient advised and verbalized understanding,lab appointment scheduled,lab orders entered. Med list updated to reflect changes.   Orders Placed This Encounter  Procedures   Basic metabolic panel    Standing Status:   Future    Standing Expiration Date:   12/28/2022    Order Specific Question:   Release to patient    Answer:   Immediate

## 2021-12-27 NOTE — Progress Notes (Signed)
Office Visit Note   Patient: Mitchell [name]           Date of Birth: October 07, 1970           MRN: [id number] Visit Date: 12/22/2021              Requested by: [name], MD [address] [place],  [place] [address] PCP: [name], MD  Chief Complaint  Patient presents with   Right Leg - Wound Check      HPI: Patient is a 51 year old gentleman who is 11 months status post right transtibial amputation.  Patient states that he just received his new leg.  Patient states he has some tenderness over the residual limb.  Assessment & Plan: Visit Diagnoses:  1. S/P below knee amputation, right (HCC)     Plan: Continue using the liner liner anticipate symptoms should resolve with the new prosthesis improved fitting.  Follow-Up Instructions: Return if symptoms worsen or fail to improve.   Ortho Exam  Patient is alert, oriented, no adenopathy, well-dressed, normal affect, normal respiratory effort. Examination the residual limb is well-healed there is a small area of callus over the end of the bone.  The soft tissue envelope is more posterior and patient was shown how to put on the prosthetic liner to improve soft tissue coverage over the residual limb.  Imaging: No results found. No images are attached to the encounter.  Labs: Lab Results  Component Value Date   HGBA1C 7.8 (A) 08/19/2021   HGBA1C 7.9 (A) 06/08/2021   HGBA1C 7.8 (H) 04/11/2021   ESRSEDRATE 81 (H) 10/21/2019   ESRSEDRATE 25 (H) 06/17/2018   ESRSEDRATE 29 (H) 05/28/2018   CRP 10.3 (H) 01/28/2021   CRP 12.8 (H) 01/27/2021   CRP 21.1 (H) 01/26/2021   REPTSTATUS 01/25/2021 FINAL 01/24/2021   GRAMSTAIN  08/29/2018    RARE WBC PRESENT, PREDOMINANTLY PMN NO ORGANISMS SEEN    CULT  01/24/2021    NO GROWTH Performed at [facility], [place] [address], [place], [place] [address]    [place] 05/21/2018   LABORGA ENTEROBACTER SPECIES 05/21/2018   LABORGA  STAPHYLOCOCCUS AUREUS 05/21/2018     Lab Results  Component Value Date   ALBUMIN 3.6 12/08/2021   ALBUMIN 4.3 09/15/2021   ALBUMIN 2.9 (L) 04/11/2021   PREALBUMIN 6.8 (L) 10/21/2019    Lab Results  Component Value Date   MG 2.3 11/29/2021   MG 1.5 (L) 04/12/2021   MG 1.8 02/16/2021   Lab Results  Component Value Date   VD25OH 11 (L) 08/16/2009    Lab Results  Component Value Date   PREALBUMIN 6.8 (L) 10/21/2019      Latest Ref Rng & Units 12/22/2021    9:46 AM 12/08/2021    8:30 AM 10/12/2021    9:30 AM  CBC EXTENDED  WBC 4.0 - 10.5 K/uL 8.0   7.3   7.8    RBC 4.22 - 5.81 MIL/uL 4.21   4.12   4.27    Hemoglobin 13.0 - 17.0 g/dL 12.5   12.2   12.4    HCT 39.0 - 52.0 % 39.5   38.4   38.3    Platelets 150 - 400 K/uL 129   135   132    NEUT# 1.7 - 7.7 K/uL  5.9   5.9    Lymph# 0.7 - 4.0 K/uL  0.8   1.3  There is no height or weight on file to calculate BMI.  Orders:  No orders of the defined types were placed in this encounter.  No orders of the defined types were placed in this encounter.    Procedures: No procedures performed  Clinical Data: No additional findings.  ROS:  All other systems negative, except as noted in the HPI. Review of Systems  Objective: Vital Signs: There were no vitals taken for this visit.  Specialty Comments:  No specialty comments available.  PMFS History: Patient Active Problem List   Diagnosis Date Noted   Near syncope 12/08/2021   CKD (chronic kidney disease) stage 3, GFR 30-59 ml/min ([place]) 12/08/2021   PVD s/p R LE amputation  12/08/2021   Diabetic retinopathy associated with type 2 diabetes mellitus ([place]) 06/06/2021   CAD (coronary artery disease) with flat troponin  04/11/2021   S/P below knee amputation, right ([place]) 02/07/2021   QT prolongation 07/30/2019   Acute on chronic combined systolic and diastolic CHF (congestive heart failure) ([place]) 07/29/2019   Stroke ([place]) 05/08/2017   Uncontrolled type 2  diabetes mellitus with hyperglycemia, with long-term current use of insulin ([place]) [id number]   Chronic systolic heart failure ([place]) 10/02/2011   Erectile dysfunction 10/02/2011   Obstructive sleep apnea 10/11/2007   HLD (hyperlipidemia) 10/10/2007   HYPOKALEMIA 10/10/2007   Obesity, Class III, BMI 40-49.9 (morbid obesity) ([place]) 10/10/2007   Essential hypertension 10/10/2007   PREMATURE VENTRICULAR CONTRACTIONS 10/10/2007   COLONIC POLYPS, HX OF 10/10/2007   Past Medical History:  Diagnosis Date   Bipolar disorder ([place])    CAD (coronary artery disease)    a. diffuse 3v CAD by cath 2019, medical therapy recommended.   Cataract    forming    Chronic systolic CHF (congestive heart failure) (HCC)    CVA (cerebral infarction)    No residual deficits   Depression    PTSD,    Diabetes mellitus    TYPE II; UNCONTROLLED BY HEMOGLOBIN A1c; STABLE AS  PER DISCHARGE   Headache(784.0)    Herpes simplex of male genitalia    History of colonic polyps    Hyperlipidemia    Hypertension    Myocardial infarction ([place]) 1987   (while playing football)   Obesity    OSA (obstructive sleep apnea)    repeat study 2018 without significant OSA   Pneumonia    Post-cardiac injury syndrome ([place])    History of cardiac injury from blunt trauma   Pulmonary hypertension ([place])    a. moderately elevated PASP 07/2019.   PVCs (premature ventricular contractions)    Schizophrenia ([place])    Goes to [facility]   Sleep apnea    Stroke [facility]) 2005   some left side weakness   Syncope    Recurrent, thought to be vasovagal. Also has h/o frequent PVCs.     Family History  Problem Relation Age of Onset   Heart disease Mother        MI   Heart failure Mother    Diabetes Mother        ALSO IN MOST OF HIS SIBLINGS; 2 UNLCES HAVE ALSO PASSED AWAY FROM DM   Cardiomyopathy Mother    Cancer - Ovarian Mother    Ovarian cancer Mother    Heart disease Father    Hypertension Father    Diabetes  Father    Diabetes Sister    Diabetes Brother    Colon cancer Paternal Uncle    Colon cancer Paternal  Uncle    Colon polyps Neg Hx    Esophageal cancer Neg Hx    Rectal cancer Neg Hx    Stomach cancer Neg Hx     Past Surgical History:  Procedure Laterality Date   AMPUTATION Right 01/25/2021   Procedure: RIGHT BELOW KNEE AMPUTATION;  Surgeon: [name], MD;  Location: [place];  Service: Orthopedics;  Laterality: Right;   CARDIAC CATHETERIZATION  12/19/10   DIFFUSE NONOBSTRUCTIVE CAD; NONISCHEMIC CARDIOMYOPATHY; LEFT VENTRICULAR ANGIOGRAM WAS PERFORMED SECONDARY TO  ELEVATED LEFT VENTRICULAR FILLING PRESSURES   COLONOSCOPY     ~ age [age]   COLONOSCOPY W/ POLYPECTOMY     I & D EXTREMITY Bilateral 10/24/2019   Procedure: IRRIGATION AND DEBRIDEMENT BILATERAL EXTREMITY WOUND ON FOOT;  Surgeon: Cindra Presume, MD;  Location: [place];  Service: Plastics;  Laterality: Bilateral;   METATARSAL HEAD EXCISION Right 08/29/2018   Procedure: METATARSAL HEAD RESECTION;  Surgeon: Edrick Kins, DPM;  Location: [place];  Service: Podiatry;  Laterality: Right;   METATARSAL OSTEOTOMY Right 08/29/2018   Procedure: SUB FIFTHE METATARSIA RIGHT FOOT;  Surgeon: Edrick Kins, DPM;  Location: [place];  Service: Podiatry;  Laterality: Right;   MULTIPLE EXTRACTIONS WITH ALVEOLOPLASTY  01/27/2014   "all my teeth; 4 Quadrants of alveoloplasty   MULTIPLE EXTRACTIONS WITH ALVEOLOPLASTY N/A 01/27/2014   Procedure: EXTRACTION OF TEETH #'1, 2, 3, 4, 5, 6, 7, 8, 9, 10, 11, 12, 13, 14, 15, 16, 17, 20, 21, 22, 23, 24, 25, 26, 27, 28, 29, 31 and 32 WITH ALVEOLOPLASTY;  Surgeon: Lenn Cal, DDS;  Location: [place];  Service: Oral Surgery;  Laterality: N/A;   ORIF FINGER / THUMB FRACTURE Right    POLYPECTOMY     RIGHT HEART CATH N/A 01/27/2021   Procedure: RIGHT HEART CATH;  Surgeon: Jolaine Artist, MD;  Location: [place] [facility];  Service: Cardiovascular;  Laterality: N/A;   RIGHT/LEFT HEART CATH AND CORONARY  ANGIOGRAPHY N/A 09/26/2017   Procedure: RIGHT/LEFT HEART CATH AND CORONARY ANGIOGRAPHY;  Surgeon: Jolaine Artist, MD;  Location: [place] [facility];  Service: Cardiovascular;  Laterality: N/A;   RIGHT/LEFT HEART CATH AND CORONARY ANGIOGRAPHY N/A 04/12/2021   Procedure: RIGHT/LEFT HEART CATH AND CORONARY ANGIOGRAPHY;  Surgeon: Jolaine Artist, MD;  Location: [place] [facility];  Service: Cardiovascular;  Laterality: N/A;   SKIN SPLIT GRAFT Bilateral 10/24/2019   Procedure: SKIN GRAFT SPLIT THICKNESS LEFT THIGH;  Surgeon: Cindra Presume, MD;  Location: [place];  Service: Plastics;  Laterality: Bilateral;   WOUND DEBRIDEMENT Right 08/29/2018   Procedure: Debridement of ulcer on right fifth metatarsal;  Surgeon: Edrick Kins, DPM;  Location: [place];  Service: Podiatry;  Laterality: Right;   Social History   Occupational History   Occupation: Mortician  Tobacco Use   Smoking status: Never   Smokeless tobacco: Never  Vaping Use   Vaping Use: Never used  Substance and Sexual Activity   Alcohol use: No   Drug use: No   Sexual activity: Not on file

## 2021-12-27 NOTE — Telephone Encounter (Signed)
-----   Message from Jacklynn Ganong, [place] sent at 12/23/2021  1:27 PM EDT ----- K is high end of normal, kidney function mildly elevated.   Stop KCL suppl. Repeat BMET in 2 weeks.

## 2022-01-04 ENCOUNTER — Telehealth: Payer: Self-pay | Admitting: Orthopedic Surgery

## 2022-01-04 NOTE — Telephone Encounter (Signed)
Pt was eval in the office last month right BKA requesting rx for oxycodone. Last refill was 5/69/2023 please advise.

## 2022-01-04 NOTE — Telephone Encounter (Signed)
Pt would like refill on oxycodone

## 2022-01-05 ENCOUNTER — Other Ambulatory Visit: Payer: Self-pay | Admitting: Orthopedic Surgery

## 2022-01-05 MED ORDER — OXYCODONE-ACETAMINOPHEN 5-325 MG PO TABS: 1.0000 | ORAL_TABLET | Freq: Three times a day (TID) | ORAL | 0 refills | Status: DC | PRN

## 2022-01-05 NOTE — Telephone Encounter (Signed)
Pt was in the office 12/22/21 and had just received his prosthetic c/o pain over the limb and working on adjustments.

## 2022-01-10 ENCOUNTER — Ambulatory Visit (HOSPITAL_COMMUNITY)
Admission: RE | Admit: 2022-01-10 | Discharge: 2022-01-10 | Disposition: A | Discharge status: Home / Self Care | Payer: No Typology Code available for payment source | Source: Ambulatory Visit | Attending: Internal Medicine | Admitting: Internal Medicine

## 2022-01-10 DIAGNOSIS — I5022 Chronic systolic (congestive) heart failure: Secondary | ICD-10-CM | POA: Insufficient documentation

## 2022-01-10 LAB — BASIC METABOLIC PANEL
Anion gap: 7 (ref 5–15)
BUN: 25 mg/dL — ABNORMAL HIGH (ref 6–20)
CO2: 27 mmol/L (ref 22–32)
Calcium: 8.8 mg/dL — ABNORMAL LOW (ref 8.9–10.3)
Chloride: 107 mmol/L (ref 98–111)
Creatinine, Ser: 1.59 mg/dL — ABNORMAL HIGH (ref 0.61–1.24)
GFR, Estimated: 52 mL/min — ABNORMAL LOW (ref >60)
Glucose, Bld: 240 mg/dL — ABNORMAL HIGH (ref 70–99)
Potassium: 4.1 mmol/L (ref 3.5–5.1)
Sodium: 141 mmol/L (ref 135–145)

## 2022-01-12 ENCOUNTER — Other Ambulatory Visit: Payer: Self-pay

## 2022-01-12 ENCOUNTER — Encounter (HOSPITAL_COMMUNITY): Payer: Self-pay | Admitting: Emergency Medicine

## 2022-01-12 ENCOUNTER — Emergency Department (HOSPITAL_COMMUNITY): Payer: No Typology Code available for payment source

## 2022-01-12 ENCOUNTER — Emergency Department (HOSPITAL_COMMUNITY)
Admission: EM | Admit: 2022-01-12 | Discharge: 2022-01-12 | Disposition: A | Discharge status: Home / Self Care | Payer: No Typology Code available for payment source | Attending: Emergency Medicine | Admitting: Emergency Medicine

## 2022-01-12 DIAGNOSIS — R55 Syncope and collapse: Secondary | ICD-10-CM

## 2022-01-12 DIAGNOSIS — Z794 Long term (current) use of insulin: Secondary | ICD-10-CM | POA: Insufficient documentation

## 2022-01-12 DIAGNOSIS — I10 Essential (primary) hypertension: Secondary | ICD-10-CM | POA: Diagnosis not present

## 2022-01-12 DIAGNOSIS — I2510 Atherosclerotic heart disease of native coronary artery without angina pectoris: Secondary | ICD-10-CM | POA: Insufficient documentation

## 2022-01-12 DIAGNOSIS — Z7982 Long term (current) use of aspirin: Secondary | ICD-10-CM | POA: Insufficient documentation

## 2022-01-12 DIAGNOSIS — E119 Type 2 diabetes mellitus without complications: Secondary | ICD-10-CM | POA: Diagnosis not present

## 2022-01-12 DIAGNOSIS — Z7901 Long term (current) use of anticoagulants: Secondary | ICD-10-CM | POA: Diagnosis not present

## 2022-01-12 DIAGNOSIS — Z79899 Other long term (current) drug therapy: Secondary | ICD-10-CM | POA: Diagnosis not present

## 2022-01-12 LAB — COMPREHENSIVE METABOLIC PANEL
ALT: 16 U/L (ref 0–44)
AST: 24 U/L (ref 15–41)
Albumin: 3.6 g/dL (ref 3.5–5.0)
Alkaline Phosphatase: 60 U/L (ref 38–126)
Anion gap: 11 (ref 5–15)
BUN: 19 mg/dL (ref 6–20)
CO2: 25 mmol/L (ref 22–32)
Calcium: 8.9 mg/dL (ref 8.9–10.3)
Chloride: 106 mmol/L (ref 98–111)
Creatinine, Ser: 1.46 mg/dL — ABNORMAL HIGH (ref 0.61–1.24)
GFR, Estimated: 57 mL/min — ABNORMAL LOW (ref >60)
Glucose, Bld: 183 mg/dL — ABNORMAL HIGH (ref 70–99)
Potassium: 3.5 mmol/L (ref 3.5–5.1)
Sodium: 142 mmol/L (ref 135–145)
Total Bilirubin: 1.1 mg/dL (ref 0.3–1.2)
Total Protein: 6.7 g/dL (ref 6.5–8.1)

## 2022-01-12 LAB — CBG MONITORING, ED
Glucose-Capillary: 173 mg/dL — ABNORMAL HIGH (ref 70–99)
Glucose-Capillary: 140 mg/dL — ABNORMAL HIGH (ref 70–99)

## 2022-01-12 LAB — CBC WITH DIFFERENTIAL/PLATELET
Abs Immature Granulocytes: 0.02 10*3/uL (ref 0.00–0.07)
Basophils Absolute: 0 10*3/uL (ref 0.0–0.1)
Basophils Relative: 1 %
Eosinophils Absolute: 0.1 10*3/uL (ref 0.0–0.5)
Eosinophils Relative: 2 %
HCT: 39.2 % (ref 39.0–52.0)
Hemoglobin: 13.2 g/dL (ref 13.0–17.0)
Immature Granulocytes: 0 %
Lymphocytes Relative: 16 %
Lymphs Abs: 1.4 10*3/uL (ref 0.7–4.0)
MCH: 30.8 pg (ref 26.0–34.0)
MCHC: 33.7 g/dL (ref 30.0–36.0)
MCV: 91.4 fL (ref 80.0–100.0)
Monocytes Absolute: 0.6 10*3/uL (ref 0.1–1.0)
Monocytes Relative: 7 %
Neutro Abs: 6.5 10*3/uL (ref 1.7–7.7)
Neutrophils Relative %: 74 %
Platelets: 118 10*3/uL — ABNORMAL LOW (ref 150–400)
RBC: 4.29 MIL/uL (ref 4.22–5.81)
RDW: 12.3 % (ref 11.5–15.5)
WBC: 8.7 10*3/uL (ref 4.0–10.5)
nRBC: 0 % (ref 0.0–0.2)

## 2022-01-12 LAB — TROPONIN I (HIGH SENSITIVITY)
Troponin I (High Sensitivity): 17 ng/L (ref <18)
Troponin I (High Sensitivity): 18 ng/L — ABNORMAL HIGH (ref <18)
Troponin I (High Sensitivity): 22 ng/L — ABNORMAL HIGH (ref <18)

## 2022-01-12 LAB — BRAIN NATRIURETIC PEPTIDE: B Natriuretic Peptide: 295.6 pg/mL — ABNORMAL HIGH (ref 0.0–100.0)

## 2022-01-12 MED ORDER — POTASSIUM CHLORIDE CRYS ER 20 MEQ PO TBCR
40.0000 meq | EXTENDED_RELEASE_TABLET | Freq: Once | ORAL | Status: AC
Start: 2022-01-12 — End: 2022-01-12
  Administered 2022-01-12: 40 meq via ORAL
  Filled 2022-01-12: qty 2

## 2022-01-12 MED ORDER — LACTATED RINGERS IV BOLUS
500.0000 mL | Freq: Once | INTRAVENOUS | Status: AC
  Administered 2022-01-12: 500 mL via INTRAVENOUS

## 2022-01-12 NOTE — Discharge Instructions (Addendum)
Keep yourself hydrated.  Stop taking Farxiga and check your blood sugars on a regular basis.  Do not skip meals.  Follow-up for your AICD placement tomorrow as scheduled.  Return to the ED with exertional chest pain, shortness of breath, nausea, vomiting, sweating, other concerns

## 2022-01-12 NOTE — ED Notes (Signed)
Pt was able to ambulate down the hall stating  he feel better

## 2022-01-12 NOTE — Consult Note (Addendum)
ELECTROPHYSIOLOGY CONSULT NOTE    Patient ID: Mitchell [name] MRN: [id number], DOB/AGE: [birth year] 53 y.o.  Admit date: 01/12/2022 Date of Consult: 01/12/2022  Primary Physician: Pearline Cables, MD Primary Cardiologist: Arvilla Meres, MD  Electrophysiologist: Dr. Ladona Ridgel  Referring Provider: Dr. Manus Gunning  Patient Profile: Mitchell [name] is a 51 y.o. male with a history of ischemic/NICM CHF with persistent LV dysfunction despite GDMT, PVCs, h/o syncope, DM2, CAD, CVA and obesity who is being seen today for the evaluation of near syncope at the request of Dr. Manus Gunning.  HPI:  Mitchell [name] is a 51 y.o. male with medical history as above.   Of note, he is on the schedule for ICD tomorrow with Dr. Ladona Ridgel.   He presented to [facility] for evaluation after feeling "week all over" and like he was going to pass out at work this am. No frank syncope. On EMS arrival found to have soft BP at 90/50 and HR 60-80s with frequent PVCs.     Pertinent labs on arrival include K 3.5, Cr 1.46, BNP 295.6, HS trop 17, WBC 8.7, Hgb 13.2. CXR with cardiomegaly but no acute findings.   EP asked to see for further evaluation given history and planning for ICD tentatively tomorrow.   Pt feeling much better now. He states the episode occurred after getting up from the toilet and walking to the time clock. He took his farxiga this am on an empty stomach and has still not eat.  He had similar symptoms when previously taking trulicity and was taken off.    Past Medical History:  Diagnosis Date   Bipolar disorder (HCC)    CAD (coronary artery disease)    a. diffuse 3v CAD by cath 2019, medical therapy recommended.   Cataract    forming    Chronic systolic CHF (congestive heart failure) (HCC)    CVA (cerebral infarction)    No residual deficits   Depression    PTSD,    Diabetes mellitus    TYPE II; UNCONTROLLED BY HEMOGLOBIN A1c; STABLE AS  PER DISCHARGE   Headache(784.0)    Herpes simplex of male  genitalia    History of colonic polyps    Hyperlipidemia    Hypertension    Myocardial infarction (HCC) 1987   (while playing football)   Obesity    OSA (obstructive sleep apnea)    repeat study 2018 without significant OSA   Pneumonia    Post-cardiac injury syndrome (HCC)    History of cardiac injury from blunt trauma   Pulmonary hypertension (HCC)    a. moderately elevated PASP 07/2019.   PVCs (premature ventricular contractions)    Schizophrenia (HCC)    Goes to [facility]   Sleep apnea    Stroke [facility]) 2005   some left side weakness   Syncope    Recurrent, thought to be vasovagal. Also has h/o frequent PVCs.      Surgical History:  Past Surgical History:  Procedure Laterality Date   AMPUTATION Right 01/25/2021   Procedure: RIGHT BELOW KNEE AMPUTATION;  Surgeon: Nadara Mustard, MD;  Location[facility];  Service: Orthopedics;  Laterality: Right;   CARDIAC CATHETERIZATION  12/19/10   DIFFUSE NONOBSTRUCTIVE CAD; NONISCHEMIC CARDIOMYOPATHY; LEFT VENTRICULAR ANGIOGRAM WAS PERFORMED SECONDARY TO  ELEVATED LEFT VENTRICULAR FILLING PRESSURES   COLONOSCOPY     ~ age [age]   COLONOSCOPY W/ POLYPECTOMY     I & D EXTREMITY Bilateral 10/24/2019   Procedure: IRRIGATION AND DEBRIDEMENT  BILATERAL EXTREMITY WOUND ON FOOT;  Surgeon: Allena Napoleon, MD;  [facility];  Service: Plastics;  Laterality: Bilateral;   METATARSAL HEAD EXCISION Right 08/29/2018   Procedure: METATARSAL HEAD RESECTION;  Surgeon: Felecia Shelling, DPM;  [facility];  Service: Podiatry;  Laterality: Right;   METATARSAL OSTEOTOMY Right 08/29/2018   Procedure: SUB FIFTHE METATARSIA RIGHT FOOT;  Surgeon: Felecia Shelling, DPM;  [facility];  Service: Podiatry;  Laterality: Right;   MULTIPLE EXTRACTIONS WITH ALVEOLOPLASTY  01/27/2014   "all my teeth; 4 Quadrants of alveoloplasty   MULTIPLE EXTRACTIONS WITH ALVEOLOPLASTY N/A 01/27/2014   Procedure: EXTRACTION OF TEETH #'1, 2, 3, 4, 5, 6, 7, 8, 9, 10, 11,  12, 13, 14, 15, 16, 17, 20, 21, 22, 23, 24, 25, 26, 27, 28, 29, 31 and 32 WITH ALVEOLOPLASTY;  Surgeon: Charlynne Pander, DDS;  [facility];  Service: Oral Surgery;  Laterality: N/A;   ORIF FINGER / THUMB FRACTURE Right    POLYPECTOMY     RIGHT HEART CATH N/A 01/27/2021   Procedure: RIGHT HEART CATH;  Surgeon: Dolores Patty, MD;  [facility];  Service: Cardiovascular;  Laterality: N/A;   RIGHT/LEFT HEART CATH AND CORONARY ANGIOGRAPHY N/A 09/26/2017   Procedure: RIGHT/LEFT HEART CATH AND CORONARY ANGIOGRAPHY;  Surgeon: Dolores Patty, MD;  [facility];  Service: Cardiovascular;  Laterality: N/A;   RIGHT/LEFT HEART CATH AND CORONARY ANGIOGRAPHY N/A 04/12/2021   Procedure: RIGHT/LEFT HEART CATH AND CORONARY ANGIOGRAPHY;  Surgeon: Dolores Patty, MD;  [facility];  Service: Cardiovascular;  Laterality: N/A;   SKIN SPLIT GRAFT Bilateral 10/24/2019   Procedure: SKIN GRAFT SPLIT THICKNESS LEFT THIGH;  Surgeon: Allena Napoleon, MD;  [facility];  Service: Plastics;  Laterality: Bilateral;   WOUND DEBRIDEMENT Right 08/29/2018   Procedure: Debridement of ulcer on right fifth metatarsal;  Surgeon: Felecia Shelling, DPM;  [facility];  Service: Podiatry;  Laterality: Right;     (Not in a hospital admission)   Inpatient Medications:   Allergies:  Allergies  Allergen Reactions   Nsaids Anaphylaxis and Rash    Able to tolerate aspirin    Trulicity [Dulaglutide] Nausea And Vomiting    Social History   Socioeconomic History   Marital status: Married    Spouse name: Not on file   Number of children: 5   Years of education: Not on file   Highest education level: Not on file  Occupational History   Occupation: Mortician  Tobacco Use   Smoking status: Never   Smokeless tobacco: Never  Vaping Use   Vaping Use: Never used  Substance and Sexual Activity   Alcohol use: No   Drug use: No   Sexual activity: Not on file   Other Topics Concern   Not on file  Social History Narrative   MARRIED, LIVES IN [place] WITH WIFE; 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   Social Determinants of Health   Financial Resource Strain: Medium Risk (04/13/2021)   Overall Financial Resource Strain (CARDIA)    Difficulty of Paying Living Expenses: Somewhat hard  Food Insecurity: No Food Insecurity (04/13/2021)   Hunger Vital Sign    Worried About Running Out of Food in the Last Year: Never true    Ran Out of Food in the Last Year: Never true  Transportation Needs:  No Transportation Needs (04/13/2021)   PRAPARE - Hydrologist (Medical): No    Lack of Transportation (Non-Medical): No  Physical Activity: Not on file  Stress: Not on file  Social Connections: Not on file  Intimate Partner Violence: Not on file     Family History  Problem Relation Age of Onset   Heart disease Mother        MI   Heart failure Mother    Diabetes Mother        ALSO IN MOST OF HIS SIBLINGS; 2 UNLCES HAVE ALSO PASSED AWAY FROM DM   Cardiomyopathy Mother    Cancer - Ovarian Mother    Ovarian cancer Mother    Heart disease Father    Hypertension Father    Diabetes Father    Diabetes Sister    Diabetes Brother    Colon cancer Paternal Uncle    Colon cancer Paternal Uncle    Colon polyps Neg Hx    Esophageal cancer Neg Hx    Rectal cancer Neg Hx    Stomach cancer Neg Hx      Review of Systems: All other systems reviewed and are otherwise negative except as noted above.  Physical Exam: Vitals:   01/12/22 0827  BP: (!) 146/82  Pulse: 78  Resp: (!) 26  Temp: 98.3 F (36.8 C)  TempSrc: Oral  SpO2: 98%    GEN- The patient is well appearing, alert and oriented x 3 today.   HEENT: normocephalic, atraumatic; sclera clear, conjunctiva pink; hearing intact; oropharynx clear; neck supple Lungs- Clear to ausculation bilaterally, normal  work of breathing.  No wheezes, rales, rhonchi Heart- Regular rate and rhythm, no murmurs, rubs or gallops GI- soft, non-tender, non-distended, bowel sounds present Extremities- no clubbing, cyanosis, or edema; DP/PT/radial pulses 2+ bilaterally MS- no significant deformity or atrophy Skin- warm and dry, no rash or lesion Psych- euthymic mood, full affect Neuro- strength and sensation are intact  Labs:   Lab Results  Component Value Date   WBC 8.7 01/12/2022   HGB 13.2 01/12/2022   HCT 39.2 01/12/2022   MCV 91.4 01/12/2022   PLT 118 (L) 01/12/2022    Recent Labs  Lab 01/12/22 0825  NA 142  K 3.5  CL 106  CO2 25  BUN 19  CREATININE 1.46*  CALCIUM 8.9  PROT 6.7  BILITOT 1.1  ALKPHOS 60  ALT 16  AST 24  GLUCOSE 183*      Radiology/Studies: DG Chest Portable 1 View  Result Date: 01/12/2022 CLINICAL DATA:  Weakness EXAM: PORTABLE CHEST 1 VIEW COMPARISON:  Chest x-ray dated Dec 08, 2021 FINDINGS: Unchanged cardiomegaly. Lungs are clear. No pleural effusion or pneumothorax. IMPRESSION: Unchanged cardiomegaly.  Clear lungs. Electronically Signed   By: Yetta Glassman M.D.   On: 01/12/2022 08:51   LONG TERM MONITOR-LIVE TELEMETRY (3-14 DAYS)  Result Date: 01/04/2022 1. NSR with sinus tachycardia 2. NSVT up to 12 beats 3. PVC's and PAC's were present. 4. No atrial fib or flutter or sustained atrial or ventricular arrhythmias. Patch Wear Time:  13 days and 18 hours (2023-05-11T14:55:12-398 to 2023-05-25T09:54:33-0400) Patient had a min HR of 62 bpm, max HR of 179 bpm, and avg HR of 80 bpm. Predominant underlying rhythm was Sinus Rhythm. 19 Ventricular Tachycardia runs occurred, the run with the fastest interval lasting 12 beats with a max rate of 179 bpm (avg 147 bpm); the run with the fastest interval was also the longest. Isolated SVEs were  occasional (3.8%, [id number]), SVE Couplets were rare (<1.0%, 1401), and SVE Triplets were rare (<1.0%, 69). Isolated VEs were occasional (3.8%,  [id number]), VE Couplets were rare (<1.0%, 6345), and VE Triplets were rare (<1.0%, 2126). Ventricular Trigeminy was present.    EKG: on arrival shows NSR at 77 bpm with PVC and LBBB at 137 bpm (personally reviewed)  TELEMETRY: NSR 70s with relatively frequent PVCs (personally reviewed)  Assessment/Plan: Near syncope Possibly vagal component on farxiga, entresto, torsemide, and amitriptyline though in setting of CHF Pt feels worse with nausea and dizziness since starting on Farxiga, with previous similar symptoms on Trulicity.  We will stop farxiga.  2. Chronic systolic heart failure Mixed ICM/NICM component Tentatively planned for ICD tomorrow.   3. CAD No s/s ischemia HS trop negative.   4. DM2 Per primary Important to control well in post operative phase  5. Obesity He has been encouraged to lose weight  Dr. Lovena Le has seen. Episode occurred after loose stools and standing up from the toilet, walking to time clock having taken farxiga on an empty stomach this am.   If tolerates lunch can go home with plans to return tomorrow for ICD as scheduled. STOP farxiga. Remain off plavix for procedure.     For questions or updates, please contact [place] Please consult [email or web address] for contact info under Cardiology/STEMI.  Jacalyn Lefevre, PA-C  01/12/2022 9:48 AM  EP Attending  Patient seen and examined. Agree with the findings as noted above. The patient presents today for evaluation of syncope. He is pending ICD insertion tomorrow. He has had trouble with [place] and severe nausea, vomiting and diarrhea and passed out after taking this medication. He denies palpitations and has not had any additional arrhythmias. He has been given fluids and feels much better. On exam he is a well appearing middle aged man, NAD. Lungs are clear and CV reveals an irregular rhythm. Ext demonstrate no edema and Tele demonstrates NSR with frequent PVC;'s.  A/P Syncope - his  symptoms appear due to the [place] as he notes that this medication makes him severely ill.  CM - he is pending ICD tomorrow.  Disp - he can be discharged home with followup tomorrow for ICD insertion.   Carleene Overlie Mirtha Jain,MD

## 2022-01-12 NOTE — ED Provider Notes (Signed)
[facility] EMERGENCY DEPARTMENT Provider Note   CSN: [id number] Arrival date & time: 01/12/22  0818     History  Chief Complaint  Patient presents with   Near Syncope    Mitchell [name] is a 51 y.o. male.  Patient with history of multiple medical problems HFrEF, mixed ischemic/nonischemic CM, 3v CAD, obesity, hypertension, type II DM, obstructive sleep apnea, bipolar disease/schizophrenia, prior CVA.   Had [facility] on 09/26/17 with 3v disease and preserved CO/CI. No revascularization options. Saw EP about ICD but did not f/u.   He presents by EMS from work today after episode of near syncope.  States he felt well when he got up around 445 and took his usual medications including Farxiga.  When he arrived at work at 715 he began to feel lightheaded and weak all over.  He went to the bathroom and had a bowel movement.  After this he remembers being lowered to a chair by his coworkers but did not fall or lose consciousness.  He states he felt "rumbling" in his stomach as well as palpitations in his chest but no chest pain or shortness of breath.  EMS reports he was clammy when they arrived with blood pressure 90/50 and blood sugar 138.  Did not eat breakfast this morning.  No fever.  Did have nausea but no vomiting.  Reports 1 episode of diarrhea.  No chest pain or shortness of breath.  Initially slow to respond for EMS but improving. He has been on [place] for several months by his report.  He is due for a defibrillator tomorrow.  Denies hitting head or losing consciousness completely.  The history is provided by the patient and the EMS personnel.  Near Syncope Pertinent negatives include no abdominal pain and no shortness of breath.       Home Medications Prior to Admission medications   Medication Sig Start Date End Date Taking? Authorizing Provider  Accu-Chek Softclix Lancets lancets Use to test blood sugars up to 4 times daily as needed. 12/08/21   [name], MD   amitriptyline (ELAVIL) 10 MG tablet TAKE 1 TABLET BY MOUTH AT BEDTIME Patient taking differently: Take 10 mg by mouth at bedtime as needed for sleep. 12/12/21   [name], [name], MD  ascorbic acid (VITAMIN C) 250 MG tablet Take 1 tablet (250 mg total) by mouth daily. 02/18/21   Love, Ivan Anchors, PA-C  aspirin 81 MG EC tablet Take 1 tablet (81 mg total) by mouth daily. 04/13/21   Danford, Suann Larry, MD  Blood Glucose Monitoring Suppl (BLOOD GLUCOSE MONITOR SYSTEM) w/Device KIT Use to test blood sugar up to 4 times daily as needed. 12/08/21   [name], MD  carvedilol (COREG) 6.25 MG tablet Take 6.25 mg by mouth 2 (two) times daily. 11/30/21   [provider]  cholecalciferol (VITAMIN D3) 25 MCG (1000 UNIT) tablet Take 1,000 Units by mouth daily.    [provider]  clopidogrel (PLAVIX) 75 MG tablet Take 1 tablet by mouth once daily 11/28/21   Bensimhon, Shaune Pascal, MD  dapagliflozin propanediol (FARXIGA) 10 MG TABS tablet Take 1 tablet (10 mg total) by mouth daily before breakfast. 05/04/21   Milford, Maricela Bo, FNP  diclofenac Sodium (VOLTAREN) 1 % GEL Apply 4 g topically 4 (four) times daily. Patient taking differently: Apply 4 g topically 4 (four) times daily as needed (pain). 09/23/21   [name], DO  gabapentin (NEURONTIN) 100 MG capsule Take 1 capsule by mouth at  bedtime Patient taking differently: Take 100 mg by mouth at bedtime as needed (pain). 07/28/21   [name], MD  glucose blood (ACCU-CHEK GUIDE) test strip Use to test blood sugars up to 4 times daily as needed. 12/08/21   [name], MD  hydrocerin (EUCERIN) CREA Apply 1 application topically 2 (two) times daily. To left foot/leg Patient not taking: Reported on 01/05/2022 02/17/21   Love, Ivan Anchors, PA-C  Insulin Glargine [facility]) 100 UNIT/ML Inject 21 Units into the skin 2 (two) times daily.    [provider]  methocarbamol (ROBAXIN) 500 MG tablet TAKE 1 TABLET BY MOUTH EVERY 6 HOURS AS  NEEDED FOR MUSCLE SPASM 08/03/21   [name], [name], MD  oxyCODONE-acetaminophen (PERCOCET/ROXICET) 5-325 MG tablet Take 1 tablet by mouth every 8 (eight) hours as needed for severe pain. 01/05/22   [name], MD  pantoprazole (PROTONIX) 40 MG tablet Take 1 tablet by mouth once daily Patient taking differently: Take 40 mg by mouth daily as needed (acid reflux). 08/03/21   [name], [name], MD  rosuvastatin (CRESTOR) 40 MG tablet Take 1 tablet by mouth once daily 11/28/21   Bensimhon, Shaune Pascal, MD  sacubitril-valsartan (ENTRESTO) 97-103 MG Take 1 tablet by mouth 2 (two) times daily. 05/04/21   Rafael Bihari, FNP  spironolactone (ALDACTONE) 25 MG tablet Take 1 tablet by mouth once daily 11/28/21   Bensimhon, Shaune Pascal, MD  torsemide (DEMADEX) 20 MG tablet Take 1 tablet (20 mg total) by mouth as needed. Patient taking differently: Take 20 mg by mouth daily as needed (fluid). 04/21/21   Joette Catching, PA-C      Allergies    Nsaids and Trulicity [dulaglutide]    Review of Systems   Review of Systems  Constitutional:  Negative for activity change, appetite change and fever.  HENT:  Negative for congestion and rhinorrhea.   Respiratory:  Negative for chest tightness and shortness of breath.   Cardiovascular:  Positive for palpitations and near-syncope.  Gastrointestinal:  Positive for diarrhea and nausea. Negative for abdominal pain and vomiting.  Genitourinary:  Negative for dysuria and hematuria.  Musculoskeletal:  Negative for arthralgias and myalgias.  Skin:  Negative for rash.  Neurological:  Positive for dizziness, syncope, weakness and light-headedness.   all other systems are negative except as noted in the HPI and PMH.    Physical Exam Updated Vital Signs BP (!) 146/82 (BP Location: Right Arm)   Pulse 78   Temp 98.3 F (36.8 C) (Oral)   Resp (!) 26   SpO2 98%  Physical Exam Vitals and nursing note reviewed.  Constitutional:      General: He is not in acute  distress.    Appearance: He is well-developed.     Comments: Somewhat slow to respond  HENT:     Head: Normocephalic and atraumatic.     Mouth/Throat:     Pharynx: No oropharyngeal exudate.  Eyes:     Conjunctiva/sclera: Conjunctivae normal.     Pupils: Pupils are equal, round, and reactive to light.  Neck:     Comments: No meningismus. Cardiovascular:     Rate and Rhythm: Normal rate and regular rhythm.     Heart sounds: Normal heart sounds. No murmur heard. Pulmonary:     Effort: Pulmonary effort is normal. No respiratory distress.     Breath sounds: Normal breath sounds.  Chest:     Chest wall: No tenderness.  Abdominal:     Palpations: Abdomen is  soft.     Tenderness: There is no abdominal tenderness. There is no guarding or rebound.  Musculoskeletal:        General: No tenderness. Normal range of motion.     Cervical back: Normal range of motion and neck supple.     Comments: Right BKA  Skin:    General: Skin is warm.  Neurological:     Mental Status: He is alert and oriented to person, place, and time.     Cranial Nerves: No cranial nerve deficit.     Motor: No abnormal muscle tone.     Coordination: Coordination normal.     Comments:  5/5 strength throughout. CN 2-12 intact.Equal grip strength.   initially bilateral grip strengths are weak.  Improved to equal strength bilaterally.  Able to raise arms off the bed bilaterally.  Able to raise legs off the bed bilaterally.  No facial droop.  Psychiatric:        Behavior: Behavior normal.     ED Results / Procedures / Treatments   Labs (all labs ordered are listed, but only abnormal results are displayed) Labs Reviewed  CBC WITH DIFFERENTIAL/PLATELET - Abnormal; Notable for the following components:      Result Value   Platelets 118 (*)    All other components within normal limits  COMPREHENSIVE METABOLIC PANEL - Abnormal; Notable for the following components:   Glucose, Bld 183 (*)    Creatinine, Ser 1.46 (*)     GFR, Estimated 57 (*)    All other components within normal limits  BRAIN NATRIURETIC PEPTIDE - Abnormal; Notable for the following components:   B Natriuretic Peptide 295.6 (*)    All other components within normal limits  CBG MONITORING, ED - Abnormal; Notable for the following components:   Glucose-Capillary 173 (*)    All other components within normal limits  CBG MONITORING, ED - Abnormal; Notable for the following components:   Glucose-Capillary 140 (*)    All other components within normal limits  TROPONIN I (HIGH SENSITIVITY) - Abnormal; Notable for the following components:   Troponin I (High Sensitivity) 18 (*)    All other components within normal limits  TROPONIN I (HIGH SENSITIVITY) - Abnormal; Notable for the following components:   Troponin I (High Sensitivity) 22 (*)    All other components within normal limits  TROPONIN I (HIGH SENSITIVITY)  TROPONIN I (HIGH SENSITIVITY)    EKG EKG Interpretation  Date/Time:  Thursday January 12 2022 08:22:12 EDT Ventricular Rate:  77 PR Interval:  188 QRS Duration: 137 QT Interval:  445 QTC Calculation: 504 R Axis:   -59 Text Interpretation: Sinus rhythm Multiple ventricular premature complexes Probable left atrial enlargement Left bundle branch block new LBBB Confirmed by Mitchell [name] [phone]) on 01/12/2022 8:30:48 AM  Radiology DG Chest Portable 1 View  Result Date: 01/12/2022 CLINICAL DATA:  Weakness EXAM: PORTABLE CHEST 1 VIEW COMPARISON:  Chest x-ray dated Dec 08, 2021 FINDINGS: Unchanged cardiomegaly. Lungs are clear. No pleural effusion or pneumothorax. IMPRESSION: Unchanged cardiomegaly.  Clear lungs. Electronically Signed   By: Yetta Glassman M.D.   On: 01/12/2022 08:51    Procedures Procedures    Medications Ordered in ED Medications - No data to display  ED Course/ Medical Decision Making/ A&P                           Medical Decision Making Amount and/or Complexity of Data Reviewed Independent  Historian: EMS  Labs: ordered. Decision-making details documented in ED Course. Radiology: ordered and independent interpretation performed. Decision-making details documented in ED Course. ECG/medicine tests: ordered and independent interpretation performed. Decision-making details documented in ED Course.  Risk Decision regarding hospitalization.  Near syncope with hypotension and diaphoresis.  Improving on arrival.  EKG does show new left bundle branch block.  Patient scheduled for defibrillator tomorrow  Blood sugar 173 on arrival.  EKG with new left bundle branch block.  Patient denies chest pain or shortness of breath. IV fluids given judiciously.  EF is 35%.  Orthostatics are negative.  Creatinine is at baseline.  Troponins negative  Episode of near syncope occurred after using bathroom and walking to time clock. Did not eat this morning but no documented hypoglycemia.  CT a/p in February showed no evidence of AAA.  Patient feels improved and back to baseline.  No dizziness or lightheadedness.  He has been seen by cardiology who feels he likely had a vasovagal episode and does not feel he needs to be admitted to the hospital.  Recommend stopping [place].  We will follow-up tomorrow for AICD as scheduled.  We will attempt p.o. challenge and ambulation.  Patient tolerating p.o. and able to ambulate.  He did eat lunch without difficulty.  Denies any dizziness or lightheadedness.  Troponin remains flat.  Patient seen by Mitchell [name] and Mitchell [name] of cardiology and electrophysiology who feels patient does not need admission and can go home.  Suspect likely vasovagal syncope. Low concern for arrhythmia.  Recommend stopping [place].  Hold Plavix for AICD tomorrow. Keep a record of blood sugars.  Mitchell [name] of hospitalist service defers to cardiology.   Patient aware of plan to stop [place]. Return tomorrow for AICD as scheduled.  Return precautions discussed.         Final Clinical  Impression(s) / ED Diagnoses Final diagnoses:  Near syncope    Rx / DC Orders ED Discharge Orders     None         Mitchell [name], Annie Main, MD 01/12/22 1824

## 2022-01-12 NOTE — ED Triage Notes (Signed)
Pt went to work this morning and felt normal. Around 0715, pt started feeling weak all over and sat down. Pt felt like he was going to pass out. He did not completely pass out. EMS reports pt was clammy when they arrived. EMS reports BP 90/50, HR 60-80 with frequent PVCs. Pt reports he is going to have a defibrillator placed tomorrow. Pt has hx of stroke. CBG 138. Pt is on a new DM med that has made him not feel very good, per pt.

## 2022-01-13 ENCOUNTER — Encounter (HOSPITAL_COMMUNITY)
Admission: RE | Disposition: A | Discharge status: Home / Self Care | Payer: No Typology Code available for payment source | Source: Ambulatory Visit | Attending: Internal Medicine

## 2022-01-13 ENCOUNTER — Other Ambulatory Visit: Payer: Self-pay

## 2022-01-13 ENCOUNTER — Ambulatory Visit (HOSPITAL_COMMUNITY): Payer: No Typology Code available for payment source

## 2022-01-13 ENCOUNTER — Ambulatory Visit (HOSPITAL_COMMUNITY)
Admission: RE | Admit: 2022-01-13 | Discharge: 2022-01-13 | Disposition: A | Discharge status: Home / Self Care | Payer: No Typology Code available for payment source | Source: Ambulatory Visit | Attending: Internal Medicine | Admitting: Internal Medicine

## 2022-01-13 ENCOUNTER — Ambulatory Visit (HOSPITAL_COMMUNITY)
Admission: RE | Admit: 2022-01-13 | Payer: No Typology Code available for payment source | Source: Home / Self Care | Admitting: Internal Medicine

## 2022-01-13 DIAGNOSIS — I2510 Atherosclerotic heart disease of native coronary artery without angina pectoris: Secondary | ICD-10-CM | POA: Insufficient documentation

## 2022-01-13 DIAGNOSIS — Z794 Long term (current) use of insulin: Secondary | ICD-10-CM | POA: Insufficient documentation

## 2022-01-13 DIAGNOSIS — Z6839 Body mass index (BMI) 39.0-39.9, adult: Secondary | ICD-10-CM | POA: Insufficient documentation

## 2022-01-13 DIAGNOSIS — I428 Other cardiomyopathies: Secondary | ICD-10-CM | POA: Diagnosis present

## 2022-01-13 DIAGNOSIS — Z8616 Personal history of COVID-19: Secondary | ICD-10-CM | POA: Insufficient documentation

## 2022-01-13 DIAGNOSIS — I5022 Chronic systolic (congestive) heart failure: Secondary | ICD-10-CM | POA: Insufficient documentation

## 2022-01-13 DIAGNOSIS — I110 Hypertensive heart disease with heart failure: Secondary | ICD-10-CM | POA: Diagnosis not present

## 2022-01-13 DIAGNOSIS — I252 Old myocardial infarction: Secondary | ICD-10-CM | POA: Insufficient documentation

## 2022-01-13 DIAGNOSIS — E669 Obesity, unspecified: Secondary | ICD-10-CM | POA: Diagnosis not present

## 2022-01-13 DIAGNOSIS — Z7984 Long term (current) use of oral hypoglycemic drugs: Secondary | ICD-10-CM | POA: Diagnosis not present

## 2022-01-13 DIAGNOSIS — E119 Type 2 diabetes mellitus without complications: Secondary | ICD-10-CM | POA: Diagnosis not present

## 2022-01-13 HISTORY — PX: ICD IMPLANT: EP1208

## 2022-01-13 LAB — GLUCOSE, CAPILLARY
Comment 1: 18713560
Glucose-Capillary: 169 mg/dL — ABNORMAL HIGH (ref 70–99)

## 2022-01-13 SURGERY — ICD IMPLANT

## 2022-01-13 MED ORDER — LIDOCAINE HCL 1 % IJ SOLN
INTRAMUSCULAR | Status: AC
  Filled 2022-01-13: qty 60

## 2022-01-13 MED ORDER — CEFAZOLIN SODIUM-DEXTROSE 2-4 GM/100ML-% IV SOLN
INTRAVENOUS | Status: AC
Start: 2022-01-13 — End: ?
  Filled 2022-01-13: qty 100

## 2022-01-13 MED ORDER — MIDAZOLAM HCL 5 MG/5ML IJ SOLN
INTRAMUSCULAR | Status: AC
  Filled 2022-01-13: qty 5

## 2022-01-13 MED ORDER — HEPARIN (PORCINE) IN NACL 1000-0.9 UT/500ML-% IV SOLN
INTRAVENOUS | Status: DC | PRN
  Administered 2022-01-13: 500 mL

## 2022-01-13 MED ORDER — MIDAZOLAM HCL 5 MG/5ML IJ SOLN
INTRAMUSCULAR | Status: DC | PRN
  Administered 2022-01-13: 1 mg via INTRAVENOUS
  Administered 2022-01-13: 2 mg via INTRAVENOUS

## 2022-01-13 MED ORDER — SODIUM CHLORIDE 0.9 % IV SOLN
80.0000 mg | INTRAVENOUS | Status: AC
  Administered 2022-01-13: 80 mg

## 2022-01-13 MED ORDER — ACETAMINOPHEN 325 MG PO TABS
325.0000 mg | ORAL_TABLET | ORAL | Status: DC | PRN
  Administered 2022-01-13: 650 mg via ORAL
  Filled 2022-01-13: qty 2

## 2022-01-13 MED ORDER — SODIUM CHLORIDE 0.9 % IV SOLN
INTRAVENOUS | Status: AC
  Filled 2022-01-13: qty 2

## 2022-01-13 MED ORDER — LIDOCAINE HCL (PF) 1 % IJ SOLN
INTRAMUSCULAR | Status: DC | PRN
  Administered 2022-01-13: 60 mL

## 2022-01-13 MED ORDER — FENTANYL CITRATE (PF) 100 MCG/2ML IJ SOLN
INTRAMUSCULAR | Status: AC
  Filled 2022-01-13: qty 2

## 2022-01-13 MED ORDER — CEFAZOLIN IN SODIUM CHLORIDE 3-0.9 GM/100ML-% IV SOLN
3.0000 g | INTRAVENOUS | Status: AC
  Administered 2022-01-13: 3 g via INTRAVENOUS
  Filled 2022-01-13: qty 100

## 2022-01-13 MED ORDER — CEFAZOLIN SODIUM-DEXTROSE 1-4 GM/50ML-% IV SOLN
1.0000 g | Freq: Once | INTRAVENOUS | Status: AC
  Administered 2022-01-13: 1 g via INTRAVENOUS
  Filled 2022-01-13 (×2): qty 50

## 2022-01-13 MED ORDER — FENTANYL CITRATE (PF) 100 MCG/2ML IJ SOLN
INTRAMUSCULAR | Status: DC | PRN
Start: 2022-01-13 — End: 2022-01-13
  Administered 2022-01-13: 25 ug via INTRAVENOUS
  Administered 2022-01-13: 12.5 ug via INTRAVENOUS

## 2022-01-13 MED ORDER — POVIDONE-IODINE 10 % EX SWAB: 2.0000 | Freq: Once | CUTANEOUS | Status: DC

## 2022-01-13 MED ORDER — CHLORHEXIDINE GLUCONATE 4 % EX LIQD: 4.0000 | Freq: Once | CUTANEOUS | Status: DC

## 2022-01-13 MED ORDER — SODIUM CHLORIDE 0.9 % IV SOLN: INTRAVENOUS | Status: DC

## 2022-01-13 MED ORDER — HEPARIN (PORCINE) IN NACL 1000-0.9 UT/500ML-% IV SOLN
INTRAVENOUS | Status: AC
  Filled 2022-01-13: qty 500

## 2022-01-13 SURGICAL SUPPLY — 7 items
CABLE SURGICAL S-101-97-12 (CABLE) ×2 IMPLANT
ICD VISIA MRI DVFB1D1 (ICD Generator) ×1 IMPLANT
LEAD SPRINT QUAT SEC 6935-65CM (Lead) ×1 IMPLANT
MAT PREVALON FULL STRYKER (MISCELLANEOUS) ×1 IMPLANT
PAD DEFIB RADIO PHYSIO CONN (PAD) ×2 IMPLANT
SHEATH 9FR PRELUDE SNAP 13 (SHEATH) ×1 IMPLANT
TRAY PACEMAKER INSERTION (PACKS) ×2 IMPLANT

## 2022-01-13 NOTE — Discharge Instructions (Signed)
After Your ICD (Implantable Cardiac Defibrillator)   You have a [employer] ICD  ACTIVITY Do not lift your arm above shoulder height for 1 week after your procedure. After 7 days, you may progress as below.  You should remove your sling 24 hours after your procedure, unless otherwise instructed by your provider.     Friday January 20, 2022  Saturday January 21, 2022 Sunday January 22, 2022 Monday January 23, 2022   Do not lift, push, pull, or carry anything over 10 pounds with the affected arm until 6 weeks (Friday February 24, 2022 ) after your procedure.   You may drive AFTER your wound check, unless you have been told otherwise by your provider.   Ask your healthcare provider when you can go back to work   INCISION/Dressing   If large square, outer bandage is left in place, this can be removed after 24 hours from your procedure. Do not remove steri-strips or glue as below.   Monitor your defibrillator site for redness, swelling, and drainage. Call the device clinic at [phone] if you experience these symptoms or fever/chills.  If your incision is sealed with Steri-strips or staples, you may shower 7 days after your procedure or when told by your provider. Do not remove the steri-strips or let the shower hit directly on your site. You may wash around your site with soap and water.    If you were discharged in a sling, please do not wear this during the day more than 48 hours after your surgery unless otherwise instructed. This may increase the risk of stiffness and soreness in your shoulder.   Avoid lotions, ointments, or perfumes over your incision until it is well-healed.  You may use a hot tub or a pool AFTER your wound check appointment if the incision is completely closed.  Your ICD is designed to protect you from life threatening heart rhythms. Because of this, you may receive a shock.   1 shock with no symptoms:  Call the office during business hours. 1 shock with symptoms (chest  pain, chest pressure, dizziness, lightheadedness, shortness of breath, overall feeling unwell):  Call 911. If you experience 2 or more shocks in 24 hours:  Call 911. If you receive a shock, you should not drive for 6 months per the [place] DMV IF you receive appropriate therapy from your ICD.   ICD Alerts:  Some alerts are vibratory and others beep. These are NOT emergencies. Please call our office to let us know. If this occurs at night or on weekends, it can wait until the next business day. Send a remote transmission.  If your device is capable of reading fluid status (for heart failure), you will be offered monthly monitoring to review this with you.   DEVICE MANAGEMENT Remote monitoring is used to monitor your ICD from home. This monitoring is scheduled every 91 days by our office. It allows [place] to keep an eye on the functioning of your device to ensure it is working properly. You will routinely see your Electrophysiologist annually (more often if necessary).   You should receive your ID card for your new device in 4-8 weeks. Keep this card with you at all times once received. Consider wearing a medical alert bracelet or necklace.  Your ICD  may be MRI compatible. This will be discussed at your next office visit/wound check.  You should avoid contact with strong electric or magnetic fields.   Do not use amateur (ham) radio equipment or electric (  arc) welding torches. MP3 player headphones with magnets should not be used. Some devices are safe to use if held at least 12 inches (30 cm) from your defibrillator. These include power tools, lawn mowers, and speakers. If you are unsure if something is safe to use, ask your health care provider.  When using your cell phone, hold it to the ear that is on the opposite side from the defibrillator. Do not leave your cell phone in a pocket over the defibrillator.  You may safely use electric blankets, heating pads, computers, and microwave ovens.  Call the  office right away if: You have chest pain. You feel more than one shock. You feel more short of breath than you have felt before. You feel more light-headed than you have felt before. Your incision starts to open up.  This information is not intended to replace advice given to you by your health care provider. Make sure you discuss any questions you have with your health care provider.

## 2022-01-13 NOTE — Progress Notes (Signed)
Pt very upset, acting out verbally. Upset because the [employer] rep is having issues pairing his equipment. Pt voicing upset and now is sitting at bed side rotating left shoulder of new ICD insertion. [employer] rep and myself asked him not to do that and explained the problems associated with pulling lead out of place. Shoulder pain was treated. Pt wants out at exact planned time of discharge. He is letting us all know he will be leaving at the exact time without fail.

## 2022-01-13 NOTE — H&P (Signed)
HPI Mr. Cossin returns today after a 4 year absence from our EP clinic. He is a pleasant 51 yo man with a h/o both ischemic and non-ischemic CM. He has longstanding LV dysfunction despite maximal GDMT. He has a h/o syncope remotely. He had covid and developed a leg infection and had to have an amputation. He denies anginal symptoms.       Allergies  Allergen Reactions   Nsaids Anaphylaxis and Other (See Comments)      Able to tolerate aspirin    Trulicity [Dulaglutide] Nausea Only              Current Outpatient Medications  Medication Sig Dispense Refill   amitriptyline (ELAVIL) 10 MG tablet TAKE 1 TABLET BY MOUTH AT BEDTIME 30 tablet 0   ascorbic acid (VITAMIN C) 250 MG tablet Take 1 tablet (250 mg total) by mouth daily. 30 tablet 0   aspirin 81 MG EC tablet Take 1 tablet (81 mg total) by mouth daily. 30 tablet 12   carvedilol (COREG) 12.5 MG tablet Take 1.5 tablets (18.75 mg total) by mouth 2 (two) times daily with a meal. 90 tablet 5   cholecalciferol (VITAMIN D3) 25 MCG (1000 UNIT) tablet Take 1,000 Units by mouth daily.       clopidogrel (PLAVIX) 75 MG tablet Take 1 tablet by mouth once daily 30 tablet 0   Continuous Blood Gluc Receiver (FREESTYLE LIBRE 2 READER) DEVI 1 each by Does not apply route daily. 30 each 3   Continuous Blood Gluc Sensor (FREESTYLE LIBRE 3 SENSOR) MISC 1 each by Does not apply route daily. Place 1 sensor on the skin every 14 days. Use to check glucose continuously 2 each 3   Continuous Blood Gluc Transmit (DEXCOM G6 TRANSMITTER) MISC 1 each by Does not apply route daily. 3 each 3   dapagliflozin propanediol (FARXIGA) 10 MG TABS tablet Take 1 tablet (10 mg total) by mouth daily before breakfast. 30 tablet 11   diclofenac Sodium (VOLTAREN) 1 % GEL Apply 4 g topically 4 (four) times daily. 100 g 0   gabapentin (NEURONTIN) 100 MG capsule Take 1 capsule by mouth at bedtime (Patient taking differently: daily as needed.) 30 capsule 0   hydrocerin  (EUCERIN) CREA Apply 1 application topically 2 (two) times daily. To left foot/leg 454 g 0   Insulin Glargine (BASAGLAR KWIKPEN) 100 UNIT/ML Inject 21 Units into the skin 2 (two) times daily.       methocarbamol (ROBAXIN) 500 MG tablet TAKE 1 TABLET BY MOUTH EVERY 6 HOURS AS NEEDED FOR MUSCLE SPASM 60 tablet 0   oxyCODONE-acetaminophen (PERCOCET/ROXICET) 5-325 MG tablet Take 1 tablet by mouth every 8 (eight) hours as needed for severe pain. 20 tablet 0   pantoprazole (PROTONIX) 40 MG tablet Take 1 tablet by mouth once daily 30 tablet 0   potassium chloride SA (KLOR-CON) 20 MEQ tablet Take 1 tablet with Torsemide. (Patient taking differently: as needed. Take 1 tablet with Torsemide.) 15 tablet 3   rosuvastatin (CRESTOR) 40 MG tablet Take 1 tablet by mouth once daily 30 tablet 0   sacubitril-valsartan (ENTRESTO) 97-103 MG Take 1 tablet by mouth 2 (two) times daily. 60 tablet 11   spironolactone (ALDACTONE) 25 MG tablet Take 1 tablet by mouth once daily 30 tablet 0   torsemide (DEMADEX) 20 MG tablet Take 1 tablet (20 mg total) by mouth as needed. 15 tablet 3    No current facility-administered medications for  this visit.            Past Medical History:  Diagnosis Date   Bipolar disorder ([place])     CAD (coronary artery disease)      a. diffuse 3v CAD by cath 2019, medical therapy recommended.   Cataract      forming    Chronic systolic CHF (congestive heart failure) (HCC)     CVA (cerebral infarction)      No residual deficits   Depression      PTSD,    Diabetes mellitus      TYPE II; UNCONTROLLED BY HEMOGLOBIN A1c; STABLE AS  PER DISCHARGE   Headache(784.0)     Herpes simplex of male genitalia     History of colonic polyps     Hyperlipidemia     Hypertension     Myocardial infarction ([place]) 1987    (while playing football)   Obesity     OSA (obstructive sleep apnea)      repeat study 2018 without significant OSA   Pneumonia     Post-cardiac injury syndrome ([place])      History of  cardiac injury from blunt trauma   Pulmonary hypertension ([place])      a. moderately elevated PASP 07/2019.   PVCs (premature ventricular contractions)     Schizophrenia ([place])      Goes to [facility]   Sleep apnea     Stroke [facility]) 2005    some left side weakness   Syncope      Recurrent, thought to be vasovagal. Also has h/o frequent PVCs.       ROS:    All systems reviewed and negative except as noted in the HPI.          Past Surgical History:  Procedure Laterality Date   AMPUTATION Right 01/25/2021    Procedure: RIGHT BELOW KNEE AMPUTATION;  Surgeon: [name], MD;  Location: [facility];  Service: Orthopedics;  Laterality: Right;   CARDIAC CATHETERIZATION   12/19/10    DIFFUSE NONOBSTRUCTIVE CAD; NONISCHEMIC CARDIOMYOPATHY; LEFT VENTRICULAR ANGIOGRAM WAS PERFORMED SECONDARY TO  ELEVATED LEFT VENTRICULAR FILLING PRESSURES   COLONOSCOPY        ~ age [age]   COLONOSCOPY W/ POLYPECTOMY       I & D EXTREMITY Bilateral 10/24/2019    Procedure: IRRIGATION AND DEBRIDEMENT BILATERAL EXTREMITY WOUND ON FOOT;  Surgeon: Cindra Presume, MD;  Location: [facility];  Service: Plastics;  Laterality: Bilateral;   METATARSAL HEAD EXCISION Right 08/29/2018    Procedure: METATARSAL HEAD RESECTION;  Surgeon: Edrick Kins, DPM;  Location: [facility];  Service: Podiatry;  Laterality: Right;   METATARSAL OSTEOTOMY Right 08/29/2018    Procedure: SUB FIFTHE METATARSIA RIGHT FOOT;  Surgeon: Edrick Kins, DPM;  Location: [facility];  Service: Podiatry;  Laterality: Right;   MULTIPLE EXTRACTIONS WITH ALVEOLOPLASTY   01/27/2014    "all my teeth; 4 Quadrants of alveoloplasty   MULTIPLE EXTRACTIONS WITH ALVEOLOPLASTY N/A 01/27/2014    Procedure: EXTRACTION OF TEETH #'1, 2, 3, 4, 5, 6, 7, 8, 9, 10, 11, 12, 13, 14, 15, 16, 17, 20, 21, 22, 23, 24, 25, 26, 27, 28, 29, 31 and 32 WITH ALVEOLOPLASTY;  Surgeon: Lenn Cal, DDS;  Location: [facility];  Service: Oral Surgery;  Laterality: N/A;   ORIF FINGER / THUMB  FRACTURE Right     POLYPECTOMY       RIGHT HEART CATH N/A 01/27/2021    Procedure:  RIGHT HEART CATH;  Surgeon: Dolores Patty, MD;  Location: [facility] INVASIVE CV LAB;  Service: Cardiovascular;  Laterality: N/A;   RIGHT/LEFT HEART CATH AND CORONARY ANGIOGRAPHY N/A 09/26/2017    Procedure: RIGHT/LEFT HEART CATH AND CORONARY ANGIOGRAPHY;  Surgeon: Dolores Patty, MD;  [facility] INV[facility] LAB;  Service: Cardiovascular;  Laterality: N/A;   RIGHT/LEFT HEART CATH AND CORONARY ANGIOGRAPHY N/A 04/12/2021    Procedure: RIGHT/LEFT HEART CATH AND CORONARY ANGIOGRAPHY;  Surgeon: Dolores Patty, MD;  [facility] LAB;  Service: Cardiovascular;  Laterality: N/A;   SKIN SPLIT GRAFT Bilateral 10/24/2019    Procedure: SKIN GRAFT SPLIT THICKNESS LEFT THIGH;  Surgeon: Allena Napoleon, MD;  [facility];  Service: Plastics;  Laterality: Bilateral;   WOUND DEBRIDEMENT Right 08/29/2018    Procedure: Debridement of ulcer on right fifth metatarsal;  Surgeon: Felecia Shelling, DPM;  [facility];  Service: Podiatry;  Laterality: Right;             Family History  Problem Relation Age of Onset   Heart disease Mother          MI   Heart failure Mother     Diabetes Mother          ALSO IN MOST OF HIS SIBLINGS; 2 UNLCES HAVE ALSO PASSED AWAY FROM DM   Cardiomyopathy Mother     Cancer - Ovarian Mother     Ovarian cancer Mother     Heart disease Father     Hypertension Father     Diabetes Father     Diabetes Sister     Diabetes Brother     Colon cancer Paternal Uncle     Colon cancer Paternal Uncle     Colon polyps Neg Hx     Esophageal cancer Neg Hx     Rectal cancer Neg Hx     Stomach cancer Neg Hx          Social History         Socioeconomic History   Marital status: Married      Spouse name: Not on file   Number of children: 5   Years of education: Not on file   Highest education level: Not on file  Occupational History   Occupation: Mortician  Tobacco Use   Smoking  status: Never   Smokeless tobacco: Never  Vaping Use   Vaping Use: Never used  Substance and Sexual Activity   Alcohol use: No   Drug use: No   Sexual activity: Not on file  Other Topics Concern   Not on file  Social History Narrative    MARRIED, LIVES IN [place] WITH WIFE; GREW UP IN [place] AND USED TO BE A COOK; HE ENJOYS COOKING AND ENJOYS EATING A LOT OF PORK AND SALT.    Social Determinants of Health       Financial Resource Strain: Medium Risk   Difficulty of Paying Living Expenses: Somewhat hard  Food Insecurity: No Food Insecurity   Worried About Programme researcher, broadcasting/film/video in the Last Year: Never true   Ran Out of Food in the Last Year: Never true  Transportation Needs: No Transportation Needs   Lack of Transportation (Medical): No   Lack of Transportation (Non-Medical): No  Physical Activity: Not on file  Stress: Not on file  Social Connections: Not on file  Intimate Partner Violence: Not on file        BP (!) 142/76  Pulse 76   Ht 5' 11.5" (1.816 m)   Wt 279 lb (126.6 kg)   SpO2 97%   BMI 38.37 kg/m    Physical Exam:   Well appearing middle aged man, NAD HEENT: Unremarkable Neck:  No JVD, no thyromegally Lymphatics:  No adenopathy Back:  No CVA tenderness Lungs:  Clear with no wheezes HEART:  Regular rate rhythm, no murmurs, no rubs, no clicks Abd:  soft, positive bowel sounds, no organomegally, no rebound, no guarding Ext:  2 plus pulses, no edema, no cyanosis, no clubbing; right bka Skin:  No rashes no nodules Neuro:  CN II through XII intact, motor grossly intact   EKG - reviewed. NSR   Assess/Plan:  1. Chronic systolic heart failure - he has 3 vessel CAD but distal disease and he is thought to have mixed disease. I have discussed the treatment options with the patient with regard to ICD insertion and he will call us when he is ready to proceed.  2. DM - he admits to dietary indiscretion. He will work hard to control his glucose prior to  device insertion.  3. CAD - he denies angina but has severe disease but cath with DM like distal disease. He will need aggressive treatment with statins and possibly Repatha.  4. Obesity - he is overweight and encouraged to lose weight.   Leonia Reeves.D.

## 2022-01-16 ENCOUNTER — Telehealth: Payer: Self-pay

## 2022-01-16 ENCOUNTER — Encounter (HOSPITAL_COMMUNITY): Payer: Self-pay | Admitting: Internal Medicine

## 2022-01-16 LAB — GLUCOSE, CAPILLARY: Glucose-Capillary: 170 mg/dL — ABNORMAL HIGH (ref 70–99)

## 2022-01-16 MED FILL — Lidocaine HCl Local Inj 1%: INTRAMUSCULAR | Qty: 60 | Status: AC

## 2022-01-16 NOTE — Telephone Encounter (Signed)
Follow-up after same day discharge: Implant date: 01/13/22 MD: Lewayne Bunting, MD Device: Medtronic [id number] Visia AF MRI VR Location: Left Chest   Wound check visit: 01/25/22 @ 9:20 AM 90 day MD follow-up: 04/19/22 @ 2:45 PM  Remote Transmission received:TBD  Dressing/sling removed: Yes  Confirm OAC restart on: N/A    Patient is not at home right now to send a manual transmission. Does not know how and does not use mychart to send directions. Requested patient call when he gets home to send transmission.

## 2022-01-16 NOTE — Telephone Encounter (Signed)
-----   Message from Graciella Freer, PA-C sent at 01/13/2022  6:00 PM EDT ----- Same day ICD

## 2022-01-16 NOTE — Telephone Encounter (Signed)
Transmission received 01-16-2022.

## 2022-01-25 ENCOUNTER — Ambulatory Visit (INDEPENDENT_AMBULATORY_CARE_PROVIDER_SITE_OTHER): Payer: No Typology Code available for payment source

## 2022-01-25 DIAGNOSIS — R9431 Abnormal electrocardiogram [ECG] [EKG]: Secondary | ICD-10-CM

## 2022-01-25 LAB — CUP PACEART INCLINIC DEVICE CHECK
Battery Remaining Longevity: 133 mo
Battery Voltage: 3.03 V
Brady Statistic RV Percent Paced: 0 %
Date Time Interrogation Session: 20230628154700
HighPow Impedance: 45 Ohm
Implantable Lead Implant Date: 20230616
Implantable Lead Location: 753860
Implantable Lead Model: 6935
Implantable Pulse Generator Implant Date: 20230616
Lead Channel Impedance Value: 342 Ohm
Lead Channel Impedance Value: 437 Ohm
Lead Channel Pacing Threshold Amplitude: 0.75 V
Lead Channel Pacing Threshold Pulse Width: 0.4 ms
Lead Channel Sensing Intrinsic Amplitude: 22.75 mV
Lead Channel Setting Pacing Amplitude: 3.5 V
Lead Channel Setting Pacing Pulse Width: 0.4 ms
Lead Channel Setting Sensing Sensitivity: 0.3 mV

## 2022-01-25 NOTE — Progress Notes (Signed)
Wound check appointment s/p ICD implant 01/13/22. Steri-strips removed. Wound without redness or edema. Incision edges approximated, wound well healed. Normal device function. Thresholds, sensing, and impedances consistent with implant measurements. Device programmed at 3.5V for extra safety margin until 3 month visit. Histogram distribution appropriate for patient and level of activity. No ventricular arrhythmias noted. Patient educated about wound care, arm mobility, lifting restrictions, shock plan. Patient enrolled in remote monitoring with next transmission scheduled 04/17/22. 91 day follow up with GT 04/19/22

## 2022-01-25 NOTE — Patient Instructions (Signed)
   After Your ICD (Implantable Cardiac Defibrillator)    Monitor your defibrillator site for redness, swelling, and drainage. Call the device clinic at [phone] if you experience these symptoms or fever/chills.  Your incision was closed with Steri-strips or staples:  You may shower 7 days after your procedure and wash your incision with soap and water. Avoid lotions, ointments, or perfumes over your incision until it is well-healed.  You may use a hot tub or a pool after your wound check appointment if the incision is completely closed.  Do not lift, push or pull greater than 10 pounds with the affected arm until 6 weeks after your procedure. There are no other restrictions in arm movement after your wound check appointment.  July 28th.  Your ICD is MRI compatible.  Your ICD is designed to protect you from life threatening heart rhythms. Because of this, you may receive a shock.   1 shock with no symptoms:  Call the office during business hours. 1 shock with symptoms (chest pain, chest pressure, dizziness, lightheadedness, shortness of breath, overall feeling unwell):  Call 911. If you experience 2 or more shocks in 24 hours:  Call 911. If you receive a shock, you should not drive.  [place] DMV - no driving for 6 months if you receive appropriate therapy from your ICD.   ICD Alerts:  Some alerts are vibratory and others beep. These are NOT emergencies. Please call our office to let us know. If this occurs at night or on weekends, it can wait until the next business day. Send a remote transmission.  If your device is capable of reading fluid status (for heart failure), you will be offered monthly monitoring to review this with you.   Remote monitoring is used to monitor your ICD from home. This monitoring is scheduled every 91 days by our office. It allows [place] to keep an eye on the functioning of your device to ensure it is working properly. You will routinely see your Electrophysiologist  annually (more often if necessary).

## 2022-01-26 ENCOUNTER — Encounter
Payer: No Typology Code available for payment source | Attending: Physical Medicine and Rehabilitation | Admitting: Physical Medicine and Rehabilitation

## 2022-01-29 ENCOUNTER — Other Ambulatory Visit (HOSPITAL_COMMUNITY): Payer: Self-pay | Admitting: Internal Medicine

## 2022-02-01 ENCOUNTER — Telehealth: Payer: Self-pay | Admitting: Orthopedic Surgery

## 2022-02-01 NOTE — Telephone Encounter (Signed)
Pt is a BKA from last year requesting refill on Percocet last refill was qty#20 last month. Please advise.

## 2022-02-01 NOTE — Telephone Encounter (Signed)
Refill not appropriate

## 2022-02-06 ENCOUNTER — Ambulatory Visit: Payer: No Typology Code available for payment source | Admitting: Dietician

## 2022-02-12 ENCOUNTER — Other Ambulatory Visit: Payer: Self-pay | Admitting: Physical Medicine and Rehabilitation

## 2022-02-20 ENCOUNTER — Encounter: Payer: Self-pay | Admitting: Orthopedic Surgery

## 2022-02-20 ENCOUNTER — Ambulatory Visit (INDEPENDENT_AMBULATORY_CARE_PROVIDER_SITE_OTHER): Payer: No Typology Code available for payment source | Admitting: Orthopedic Surgery

## 2022-02-20 DIAGNOSIS — Z89511 Acquired absence of right leg below knee: Secondary | ICD-10-CM

## 2022-02-20 DIAGNOSIS — S8011XA Contusion of right lower leg, initial encounter: Secondary | ICD-10-CM | POA: Diagnosis not present

## 2022-02-20 NOTE — Progress Notes (Signed)
Office Visit Note   Patient: Mitchell [name]           Date of Birth: 03-Nov-1970           MRN: [id number] Visit Date: 02/20/2022              Requested by: [name], MD [address] [place],  [place] [address] PCP: [name], MD  Chief Complaint  Patient presents with   Right Leg - Pain    HX right BKA 01/25/2021      HPI: Patient is a 51 year old gentleman who is about a year out from right transtibial amputation.  Patient states that he fell about 2 weeks ago and the leg went backwards.  Patient has been having increased pain over the residual limb also states he has phantom pain that he is taking Neurontin for.  Assessment & Plan: Visit Diagnoses:  1. S/P below knee amputation, right ([place])   2. Contusion of right lower extremity, initial encounter     Plan: Patient was shown a different way to apply the liner to unload pressure from the trauma at the end of the residual limb.  Follow-Up Instructions: Return if symptoms worsen or fail to improve.   Ortho Exam  Patient is alert, oriented, no adenopathy, well-dressed, normal affect, normal respiratory effort. Examination patient has callus over the residual limb but no ulcers no fluctuance no fluid no cellulitis.  He is currently wearing a liner liner which is helping with the moisture.  Imaging: No results found. No images are attached to the encounter.  Labs: Lab Results  Component Value Date   HGBA1C 7.8 (A) 08/19/2021   HGBA1C 7.9 (A) 06/08/2021   HGBA1C 7.8 (H) 04/11/2021   ESRSEDRATE 81 (H) 10/21/2019   ESRSEDRATE 25 (H) 06/17/2018   ESRSEDRATE 29 (H) 05/28/2018   CRP 10.3 (H) 01/28/2021   CRP 12.8 (H) 01/27/2021   CRP 21.1 (H) 01/26/2021   REPTSTATUS 01/25/2021 FINAL 01/24/2021   GRAMSTAIN  08/29/2018    RARE WBC PRESENT, PREDOMINANTLY PMN NO ORGANISMS SEEN    CULT  01/24/2021    NO GROWTH Performed at [facility], [place] [address], [place], [place] [address]     LABORGA KLEBSIELLA PNEUMONIAE 05/21/2018   LABORGA ENTEROBACTER SPECIES 05/21/2018   LABORGA STAPHYLOCOCCUS AUREUS 05/21/2018     Lab Results  Component Value Date   ALBUMIN 3.6 01/12/2022   ALBUMIN 3.6 12/08/2021   ALBUMIN 4.3 09/15/2021   PREALBUMIN 6.8 (L) 10/21/2019    Lab Results  Component Value Date   MG 2.3 11/29/2021   MG 1.5 (L) 04/12/2021   MG 1.8 02/16/2021   Lab Results  Component Value Date   VD25OH 11 (L) 08/16/2009    Lab Results  Component Value Date   PREALBUMIN 6.8 (L) 10/21/2019      Latest Ref Rng & Units 01/12/2022    8:25 AM 12/22/2021    9:46 AM 12/08/2021    8:30 AM  CBC EXTENDED  WBC 4.0 - 10.5 K/uL 8.7  8.0  7.3   RBC 4.22 - 5.81 MIL/uL 4.29  4.21  4.12   Hemoglobin 13.0 - 17.0 g/dL 13.2  12.5  12.2   HCT 39.0 - 52.0 % 39.2  39.5  38.4   Platelets 150 - 400 K/uL 118  129  135   NEUT# 1.7 - 7.7 K/uL 6.5   5.9   Lymph# 0.7 - 4.0 K/uL 1.4  0.8      There is no height or weight on file to calculate BMI.  Orders:  No orders of the defined types were placed in this encounter.  No orders of the defined types were placed in this encounter.    Procedures: No procedures performed  Clinical Data: No additional findings.  ROS:  All other systems negative, except as noted in the HPI. Review of Systems  Objective: Vital Signs: There were no vitals taken for this visit.  Specialty Comments:  No specialty comments available.  PMFS History: Patient Active Problem List   Diagnosis Date Noted   Near syncope 12/08/2021   CKD (chronic kidney disease) stage 3, GFR 30-59 ml/min (HCC) 12/08/2021   PVD s/p R LE amputation  12/08/2021   Diabetic retinopathy associated with type 2 diabetes mellitus (HCC) 06/06/2021   CAD (coronary artery disease) with flat troponin  04/11/2021   S/P below knee amputation, right (HCC) 02/07/2021   QT prolongation 07/30/2019   Acute on chronic combined systolic and diastolic CHF (congestive heart failure)  (HCC) 07/29/2019   Stroke (HCC) 05/08/2017   Uncontrolled type 2 diabetes mellitus with hyperglycemia, with long-term current use of insulin (HCC) 12/07/2015   Chronic systolic heart failure (HCC) 10/02/2011   Erectile dysfunction 10/02/2011   Obstructive sleep apnea 10/11/2007   HLD (hyperlipidemia) 10/10/2007   HYPOKALEMIA 10/10/2007   Obesity, Class III, BMI 40-49.9 (morbid obesity) (HCC) 10/10/2007   Essential hypertension 10/10/2007   PREMATURE VENTRICULAR CONTRACTIONS 10/10/2007   COLONIC POLYPS, HX OF 10/10/2007   Past Medical History:  Diagnosis Date   Bipolar disorder (HCC)    CAD (coronary artery disease)    a. diffuse 3v CAD by cath 2019, medical therapy recommended.   Cataract    forming    Chronic systolic CHF (congestive heart failure) (HCC)    CVA (cerebral infarction)    No residual deficits   Depression    PTSD,    Diabetes mellitus    TYPE II; UNCONTROLLED BY HEMOGLOBIN A1c; STABLE AS  PER DISCHARGE   Headache(784.0)    Herpes simplex of male genitalia    History of colonic polyps    Hyperlipidemia    Hypertension    Myocardial infarction (HCC) 1987   (while playing football)   Obesity    OSA (obstructive sleep apnea)    repeat study 2018 without significant OSA   Pneumonia    Post-cardiac injury syndrome (HCC)    History of cardiac injury from blunt trauma   Pulmonary hypertension (HCC)    a. moderately elevated PASP 07/2019.   PVCs (premature ventricular contractions)    Schizophrenia (HCC)    [facility]   Sleep apnea    Stroke [facility]) 2005   some left side weakness   Syncope    Recurrent, thought to be vasovagal. Also has h/o frequent PVCs.     Family History  Problem Relation Age of Onset   Heart disease Mother        MI   Heart failure Mother    Diabetes Mother        ALSO IN MOST OF HIS SIBLINGS; 2 UNLCES HAVE ALSO PASSED AWAY FROM DM   Cardiomyopathy Mother    Cancer - Ovarian Mother    Ovarian cancer Mother     Heart disease Father    Hypertension Father    Diabetes Father    Diabetes Sister    Diabetes Brother    Colon cancer Paternal Uncle  Colon cancer Paternal Uncle    Colon polyps Neg Hx    Esophageal cancer Neg Hx    Rectal cancer Neg Hx    Stomach cancer Neg Hx     Past Surgical History:  Procedure Laterality Date   AMPUTATION Right 01/25/2021   Procedure: RIGHT BELOW KNEE AMPUTATION;  Surgeon: Nadara Mustard, MD;  Location: [facility] OR;  Service: Orthopedics;  Laterality: Right;   CARDIAC CATHETERIZATION  12/19/10   DIFFUSE NONOBSTRUCTIVE CAD; NONISCHEMIC CARDIOMYOPATHY; LEFT VENTRICULAR ANGIOGRAM WAS PERFORMED SECONDARY TO  ELEVATED LEFT VENTRICULAR FILLING PRESSURES   COLONOSCOPY     ~ age [age]   COLONOSCOPY W/ POLYPECTOMY     I & D EXTREMITY Bilateral 10/24/2019   Procedure: IRRIGATION AND DEBRIDEMENT BILATERAL EXTREMITY WOUND ON FOOT;  Surgeon: Allena Napoleon, MD;  [facility];  Service: Plastics;  Laterality: Bilateral;   ICD IMPLANT N/A 01/13/2022   Procedure: ICD IMPLANT;  Surgeon: Marinus Maw, MD;  Location: [facility] INVASIVE CV LAB;  Service: Cardiovascular;  Laterality: N/A;   METATARSAL HEAD EXCISION Right 08/29/2018   Procedure: METATARSAL HEAD RESECTION;  Surgeon: Felecia Shelling, DPM;  [facility];  Service: Podiatry;  Laterality: Right;   METATARSAL OSTEOTOMY Right 08/29/2018   Procedure: SUB FIFTHE METATARSIA RIGHT FOOT;  Surgeon: Felecia Shelling, DPM;  [facility];  Service: Podiatry;  Laterality: Right;   MULTIPLE EXTRACTIONS WITH ALVEOLOPLASTY  01/27/2014   "all my teeth; 4 Quadrants of alveoloplasty   MULTIPLE EXTRACTIONS WITH ALVEOLOPLASTY N/A 01/27/2014   Procedure: EXTRACTION OF TEETH #'1, 2, 3, 4, 5, 6, 7, 8, 9, 10, 11, 12, 13, 14, 15, 16, 17, 20, 21, 22, 23, 24, 25, 26, 27, 28, 29, 31 and 32 WITH ALVEOLOPLASTY;  Surgeon: Charlynne Pander, DDS;  [facility];  Service: Oral Surgery;  Laterality: N/A;   ORIF FINGER / THUMB FRACTURE Right    POLYPECTOMY      RIGHT HEART CATH N/A 01/27/2021   Procedure: RIGHT HEART CATH;  Surgeon: Dolores Patty, MD;  [facility] LAB;  Service: Cardiovascular;  Laterality: N/A;   RIGHT/LEFT HEART CATH AND CORONARY ANGIOGRAPHY N/A 09/26/2017   Procedure: RIGHT/LEFT HEART CATH AND CORONARY ANGIOGRAPHY;  Surgeon: Dolores Patty, MD;  [facility];  Service: Cardiovascular;  Laterality: N/A;   RIGHT/LEFT HEART CATH AND CORONARY ANGIOGRAPHY N/A 04/12/2021   Procedure: RIGHT/LEFT HEART CATH AND CORONARY ANGIOGRAPHY;  Surgeon: Dolores Patty, MD;  [facility] LAB;  Service: Cardiovascular;  Laterality: N/A;   SKIN SPLIT GRAFT Bilateral 10/24/2019   Procedure: SKIN GRAFT SPLIT THICKNESS LEFT THIGH;  Surgeon: Allena Napoleon, MD;  [facility];  Service: Plastics;  Laterality: Bilateral;   WOUND DEBRIDEMENT Right 08/29/2018   Procedure: Debridement of ulcer on right fifth metatarsal;  Surgeon: Felecia Shelling, DPM;  [facility];  Service: Podiatry;  Laterality: Right;   Social History   Occupational History   Occupation: Mortician  Tobacco Use   Smoking status: Never   Smokeless tobacco: Never  Vaping Use   Vaping Use: Never used  Substance and Sexual Activity   Alcohol use: No   Drug use: No   Sexual activity: Not on file

## 2022-02-23 ENCOUNTER — Ambulatory Visit (INDEPENDENT_AMBULATORY_CARE_PROVIDER_SITE_OTHER): Payer: No Typology Code available for payment source | Admitting: Orthopedic Surgery

## 2022-02-23 DIAGNOSIS — Z89511 Acquired absence of right leg below knee: Secondary | ICD-10-CM

## 2022-02-23 DIAGNOSIS — L97921 Non-pressure chronic ulcer of unspecified part of left lower leg limited to breakdown of skin: Secondary | ICD-10-CM

## 2022-02-24 ENCOUNTER — Encounter: Payer: Self-pay | Admitting: Orthopedic Surgery
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
## 2022-02-27 ENCOUNTER — Telehealth (HOSPITAL_COMMUNITY): Payer: Self-pay

## 2022-02-27 NOTE — Telephone Encounter (Signed)
Called and left patient a voice message to confirm/remind patient of their appointment at [facility] on 02/28/22.

## 2022-02-28 ENCOUNTER — Encounter (HOSPITAL_COMMUNITY): Payer: No Typology Code available for payment source

## 2022-03-01 ENCOUNTER — Ambulatory Visit (INDEPENDENT_AMBULATORY_CARE_PROVIDER_SITE_OTHER): Payer: No Typology Code available for payment source | Admitting: Family

## 2022-03-01 ENCOUNTER — Encounter: Payer: Self-pay | Admitting: Family

## 2022-03-01 DIAGNOSIS — R600 Localized edema: Secondary | ICD-10-CM | POA: Diagnosis not present

## 2022-03-01 DIAGNOSIS — Z89511 Acquired absence of right leg below knee: Secondary | ICD-10-CM

## 2022-03-01 DIAGNOSIS — L97321 Non-pressure chronic ulcer of left ankle limited to breakdown of skin: Secondary | ICD-10-CM

## 2022-03-01 NOTE — Progress Notes (Signed)
Office Visit Note   Patient: Mitchell [name]           Date of Birth: 07-10-1971           MRN: [id number] Visit Date: 03/01/2022              Requested by: [name], MD [address] [place],  [place] [address] PCP: [name], MD  Chief Complaint  Patient presents with   Left Leg - Wound Check, Follow-up      HPI: The patient is a 51 year old gentleman seen today in follow-up for traumatic ulceration to his left leg he has been in a Dynaflex compression wrap for the last 1 week.  He is also status post right below-knee amputation using Lasix for his lower extremity edema daily.  Assessment & Plan: Visit Diagnoses: No diagnosis found.  Plan: Placed in a compression stocking this morning he will change this daily after bathing and also cleansing of his ankle wound  Follow-Up Instructions: No follow-ups on file.   Ortho Exam  Patient is alert, oriented, no adenopathy, well-dressed, normal affect, normal respiratory effort. On examination of the left lower extremity there is good wrinkling of the skin from compression.  The ulcer over his lateral ankle is 9 mm in diameter covered with eschar there is no drainage no surrounding erythema no warmth he does have a palpable dorsalis pedis pulse  Imaging: No results found. No images are attached to the encounter.  Labs: Lab Results  Component Value Date   HGBA1C 7.8 (A) 08/19/2021   HGBA1C 7.9 (A) 06/08/2021   HGBA1C 7.8 (H) 04/11/2021   ESRSEDRATE 81 (H) 10/21/2019   ESRSEDRATE 25 (H) 06/17/2018   ESRSEDRATE 29 (H) 05/28/2018   CRP 10.3 (H) 01/28/2021   CRP 12.8 (H) 01/27/2021   CRP 21.1 (H) 01/26/2021   REPTSTATUS 01/25/2021 FINAL 01/24/2021   GRAMSTAIN  08/29/2018    RARE WBC PRESENT, PREDOMINANTLY PMN NO ORGANISMS SEEN    CULT  01/24/2021    NO GROWTH Performed at [facility], [place] [address], [place],  [address]    LABORGA KLEBSIELLA PNEUMONIAE 05/21/2018   LABORGA  ENTEROBACTER SPECIES 05/21/2018   LABORGA STAPHYLOCOCCUS AUREUS 05/21/2018     Lab Results  Component Value Date   ALBUMIN 3.6 01/12/2022   ALBUMIN 3.6 12/08/2021   ALBUMIN 4.3 09/15/2021   PREALBUMIN 6.8 (L) 10/21/2019    Lab Results  Component Value Date   MG 2.3 11/29/2021   MG 1.5 (L) 04/12/2021   MG 1.8 02/16/2021   Lab Results  Component Value Date   VD25OH 11 (L) 08/16/2009    Lab Results  Component Value Date   PREALBUMIN 6.8 (L) 10/21/2019      Latest Ref Rng & Units 01/12/2022    8:25 AM 12/22/2021    9:46 AM 12/08/2021    8:30 AM  CBC EXTENDED  WBC 4.0 - 10.5 K/uL 8.7  8.0  7.3   RBC 4.22 - 5.81 MIL/uL 4.29  4.21  4.12   Hemoglobin 13.0 - 17.0 g/dL 13.2  12.5  12.2   HCT 39.0 - 52.0 % 39.2  39.5  38.4   Platelets 150 - 400 K/uL 118  129  135   NEUT# 1.7 - 7.7 K/uL 6.5   5.9   Lymph# 0.7 - 4.0 K/uL 1.4   0.8      There is no height or weight on file to calculate BMI.  Orders:  No orders of the defined types were placed in this encounter.  No orders of the defined types were placed in this encounter.    Procedures: No procedures performed  Clinical Data: No additional findings.  ROS:  All other systems negative, except as noted in the HPI. Review of Systems  Objective: Vital Signs: There were no vitals taken for this visit.  Specialty Comments:  No specialty comments available.  PMFS History: Patient Active Problem List   Diagnosis Date Noted   Near syncope 12/08/2021   CKD (chronic kidney disease) stage 3, GFR 30-59 ml/min (HCC) 12/08/2021   PVD s/p R LE amputation  12/08/2021   Diabetic retinopathy associated with type 2 diabetes mellitus (HCC) 06/06/2021   CAD (coronary artery disease) with flat troponin  04/11/2021   S/P below knee amputation, right (HCC) 02/07/2021   QT prolongation 07/30/2019   Acute on chronic combined systolic and diastolic CHF (congestive heart failure) (HCC) 07/29/2019   Stroke (HCC) 05/08/2017    Uncontrolled type 2 diabetes mellitus with hyperglycemia, with long-term current use of insulin (HCC) 12/07/2015   Chronic systolic heart failure (HCC) 10/02/2011   Erectile dysfunction 10/02/2011   Obstructive sleep apnea 10/11/2007   HLD (hyperlipidemia) 10/10/2007   HYPOKALEMIA 10/10/2007   Obesity, Class III, BMI 40-49.9 (morbid obesity) (HCC) 10/10/2007   Essential hypertension 10/10/2007   PREMATURE VENTRICULAR CONTRACTIONS 10/10/2007   COLONIC POLYPS, HX OF 10/10/2007   Past Medical History:  Diagnosis Date   Bipolar disorder (HCC)    CAD (coronary artery disease)    a. diffuse 3v CAD by cath 2019, medical therapy recommended.   Cataract    forming    Chronic systolic CHF (congestive heart failure) (HCC)    CVA (cerebral infarction)    No residual deficits   Depression    PTSD,    Diabetes mellitus    TYPE II; UNCONTROLLED BY HEMOGLOBIN A1c; STABLE AS  PER DISCHARGE   Headache(784.0)    Herpes simplex of male genitalia    History of colonic polyps    Hyperlipidemia    Hypertension    Myocardial infarction (HCC) 1987   (while playing football)   Obesity    OSA (obstructive sleep apnea)    repeat study 2018 without significant OSA   Pneumonia    Post-cardiac injury syndrome (HCC)    History of cardiac injury from blunt trauma   Pulmonary hypertension (HCC)    a. moderately elevated PASP 07/2019.   PVCs (premature ventricular contractions)    Schizophrenia (HCC)    [facility]   Sleep apnea    Stroke [facility]) 2005   some left side weakness   Syncope    Recurrent, thought to be vasovagal. Also has h/o frequent PVCs.     Family History  Problem Relation Age of Onset   Heart disease Mother        MI   Heart failure Mother    Diabetes Mother        ALSO IN MOST OF HIS SIBLINGS; 2 UNLCES HAVE ALSO PASSED AWAY FROM DM   Cardiomyopathy Mother    Cancer - Ovarian Mother    Ovarian cancer Mother    Heart disease Father    Hypertension  Father    Diabetes Father    Diabetes Sister    Diabetes Brother    Colon cancer Paternal Uncle    Colon cancer Paternal Uncle    Colon polyps Neg Hx    Esophageal  cancer Neg Hx    Rectal cancer Neg Hx    Stomach cancer Neg Hx     Past Surgical History:  Procedure Laterality Date   AMPUTATION Right 01/25/2021   Procedure: RIGHT BELOW KNEE AMPUTATION;  Surgeon: Nadara Mustard, MD;  Location: [facility] OR;  Service: Orthopedics;  Laterality: Right;   CARDIAC CATHETERIZATION  12/19/10   DIFFUSE NONOBSTRUCTIVE CAD; NONISCHEMIC CARDIOMYOPATHY; LEFT VENTRICULAR ANGIOGRAM WAS PERFORMED SECONDARY TO  ELEVATED LEFT VENTRICULAR FILLING PRESSURES   COLONOSCOPY     ~ age [age]   COLONOSCOPY W/ POLYPECTOMY     I & D EXTREMITY Bilateral 10/24/2019   Procedure: IRRIGATION AND DEBRIDEMENT BILATERAL EXTREMITY WOUND ON FOOT;  Surgeon: Allena Napoleon, MD;  Location: [facility] OR;  Service: Plastics;  Laterality: Bilateral;   ICD IMPLANT N/A 01/13/2022   Procedure: ICD IMPLANT;  Surgeon: Marinus Maw, MD;  Location: [facility] INVASIVE CV LAB;  Service: Cardiovascular;  Laterality: N/A;   METATARSAL HEAD EXCISION Right 08/29/2018   Procedure: METATARSAL HEAD RESECTION;  Surgeon: Felecia Shelling, DPM;  Location: [facility] OR;  Service: Podiatry;  Laterality: Right;   METATARSAL OSTEOTOMY Right 08/29/2018   Procedure: SUB FIFTHE METATARSIA RIGHT FOOT;  Surgeon: Felecia Shelling, DPM;  Location: [facility] OR;  Service: Podiatry;  Laterality: Right;   MULTIPLE EXTRACTIONS WITH ALVEOLOPLASTY  01/27/2014   "all my teeth; 4 Quadrants of alveoloplasty   MULTIPLE EXTRACTIONS WITH ALVEOLOPLASTY N/A 01/27/2014   Procedure: EXTRACTION OF TEETH #'1, 2, 3, 4, 5, 6, 7, 8, 9, 10, 11, 12, 13, 14, 15, 16, 17, 20, 21, 22, 23, 24, 25, 26, 27, 28, 29, 31 and 32 WITH ALVEOLOPLASTY;  Surgeon: Charlynne Pander, DDS;  Location: [facility] OR;  Service: Oral Surgery;  Laterality: N/A;   ORIF FINGER / THUMB FRACTURE Right    POLYPECTOMY     RIGHT HEART CATH N/A 01/27/2021    Procedure: RIGHT HEART CATH;  Surgeon: Dolores Patty, MD;  Location: [facility] INVASIVE CV LAB;  Service: Cardiovascular;  Laterality: N/A;   RIGHT/LEFT HEART CATH AND CORONARY ANGIOGRAPHY N/A 09/26/2017   Procedure: RIGHT/LEFT HEART CATH AND CORONARY ANGIOGRAPHY;  Surgeon: Dolores Patty, MD;  [facility]: [facility] INVAS[facility] CV LAB;  Service: Cardiovascular;  Laterality: N/A;   RIGHT/LEFT HEART CATH AND CORONARY ANGIOGRAPHY N/A 04/12/2021   Procedure: RIGHT/LEFT HEART CATH AND CORONARY ANGIOGRAPHY;  Surgeon: Dolores Patty, MD;  Location: [facility] INVASIVE CV LAB;  Service: Cardiovascular;  Laterality: N/A;   SKIN SPLIT GRAFT Bilateral 10/24/2019   Procedure: SKIN GRAFT SPLIT THICKNESS LEFT THIGH;  Surgeon: Allena Napoleon, MD;  Location: [facility] OR;  Service: Plastics;  Laterality: Bilateral;   WOUND DEBRIDEMENT Right 08/29/2018   Procedure: Debridement of ulcer on right fifth metatarsal;  Surgeon: Felecia Shelling, DPM;  Location: [facility] OR;  Service: Podiatry;  Laterality: Right;   Social History   Occupational History   Occupation: Mortician  Tobacco Use   Smoking status: Never   Smokeless tobacco: Never  Vaping Use   Vaping Use: Never used  Substance and Sexual Activity   Alcohol use: No   Drug use: No   Sexual activity: Not on file

## 2022-03-02 ENCOUNTER — Ambulatory Visit: Payer: No Typology Code available for payment source | Admitting: Orthopedic Surgery

## 2022-03-05 ENCOUNTER — Emergency Department (HOSPITAL_BASED_OUTPATIENT_CLINIC_OR_DEPARTMENT_OTHER)
Admission: EM | Admit: 2022-03-05 | Discharge: 2022-03-05 | Disposition: A | Discharge status: Home / Self Care | Payer: No Typology Code available for payment source | Attending: Emergency Medicine | Admitting: Emergency Medicine

## 2022-03-05 ENCOUNTER — Encounter (HOSPITAL_BASED_OUTPATIENT_CLINIC_OR_DEPARTMENT_OTHER): Payer: Self-pay

## 2022-03-05 ENCOUNTER — Emergency Department (HOSPITAL_BASED_OUTPATIENT_CLINIC_OR_DEPARTMENT_OTHER): Payer: No Typology Code available for payment source | Admitting: Radiology

## 2022-03-05 DIAGNOSIS — Z760 Encounter for issue of repeat prescription: Secondary | ICD-10-CM | POA: Insufficient documentation

## 2022-03-05 DIAGNOSIS — I5022 Chronic systolic (congestive) heart failure: Secondary | ICD-10-CM | POA: Diagnosis not present

## 2022-03-05 DIAGNOSIS — I509 Heart failure, unspecified: Secondary | ICD-10-CM | POA: Diagnosis not present

## 2022-03-05 DIAGNOSIS — I110 Hypertensive heart disease with heart failure: Secondary | ICD-10-CM | POA: Insufficient documentation

## 2022-03-05 DIAGNOSIS — Z794 Long term (current) use of insulin: Secondary | ICD-10-CM | POA: Insufficient documentation

## 2022-03-05 DIAGNOSIS — Z79899 Other long term (current) drug therapy: Secondary | ICD-10-CM | POA: Insufficient documentation

## 2022-03-05 DIAGNOSIS — R739 Hyperglycemia, unspecified: Secondary | ICD-10-CM

## 2022-03-05 DIAGNOSIS — I2510 Atherosclerotic heart disease of native coronary artery without angina pectoris: Secondary | ICD-10-CM | POA: Diagnosis not present

## 2022-03-05 DIAGNOSIS — Z7982 Long term (current) use of aspirin: Secondary | ICD-10-CM | POA: Diagnosis not present

## 2022-03-05 DIAGNOSIS — R0602 Shortness of breath: Secondary | ICD-10-CM | POA: Diagnosis present

## 2022-03-05 DIAGNOSIS — E1165 Type 2 diabetes mellitus with hyperglycemia: Secondary | ICD-10-CM | POA: Insufficient documentation

## 2022-03-05 LAB — CBC WITH DIFFERENTIAL/PLATELET
Abs Immature Granulocytes: 0.02 10*3/uL (ref 0.00–0.07)
Basophils Absolute: 0 10*3/uL (ref 0.0–0.1)
Basophils Relative: 0 %
Eosinophils Absolute: 0.1 10*3/uL (ref 0.0–0.5)
Eosinophils Relative: 2 %
HCT: 35.7 % — ABNORMAL LOW (ref 39.0–52.0)
Hemoglobin: 11.8 g/dL — ABNORMAL LOW (ref 13.0–17.0)
Immature Granulocytes: 0 %
Lymphocytes Relative: 15 %
Lymphs Abs: 1.2 10*3/uL (ref 0.7–4.0)
MCH: 29.9 pg (ref 26.0–34.0)
MCHC: 33.1 g/dL (ref 30.0–36.0)
MCV: 90.4 fL (ref 80.0–100.0)
Monocytes Absolute: 0.6 10*3/uL (ref 0.1–1.0)
Monocytes Relative: 7 %
Neutro Abs: 6.1 10*3/uL (ref 1.7–7.7)
Neutrophils Relative %: 76 %
Platelets: 133 10*3/uL — ABNORMAL LOW (ref 150–400)
RBC: 3.95 MIL/uL — ABNORMAL LOW (ref 4.22–5.81)
RDW: 12.5 % (ref 11.5–15.5)
WBC: 8.1 10*3/uL (ref 4.0–10.5)
nRBC: 0 % (ref 0.0–0.2)

## 2022-03-05 LAB — BASIC METABOLIC PANEL
Anion gap: 8 (ref 5–15)
BUN: 19 mg/dL (ref 6–20)
CO2: 28 mmol/L (ref 22–32)
Calcium: 8.7 mg/dL — ABNORMAL LOW (ref 8.9–10.3)
Chloride: 107 mmol/L (ref 98–111)
Creatinine, Ser: 1.5 mg/dL — ABNORMAL HIGH (ref 0.61–1.24)
GFR, Estimated: 55 mL/min — ABNORMAL LOW (ref >60)
Glucose, Bld: 219 mg/dL — ABNORMAL HIGH (ref 70–99)
Potassium: 3.6 mmol/L (ref 3.5–5.1)
Sodium: 143 mmol/L (ref 135–145)

## 2022-03-05 MED ORDER — FUROSEMIDE 10 MG/ML IJ SOLN
40.0000 mg | Freq: Once | INTRAMUSCULAR | Status: AC
  Administered 2022-03-05: 40 mg via INTRAVENOUS
  Filled 2022-03-05: qty 4

## 2022-03-05 MED ORDER — TORSEMIDE 20 MG PO TABS: 20.0000 mg | ORAL_TABLET | ORAL | 0 refills | Status: DC | PRN

## 2022-03-05 NOTE — ED Provider Notes (Signed)
[facility] Provider Note: Georgena Spurling, MD, FACEP  CSN: [id number] MRN: [id number] ARRIVAL: 03/05/22 at [place]: [place]  03/05/22 3:51 AM Mitchell [name] is a 51 y.o. male with a history of CHF.  He ran out of his torsemide (20 mg daily) 3 days ago and has had worsening shortness of breath since then.  He was hoping to wait until tomorrow to contact his physician but he was unable to sleep overnight due to orthopnea.  He has had a cough with this as well, especially when attempting to lie supine.  He denies any chest pain.    Past Medical History:  Diagnosis Date   Bipolar disorder ([place])    CAD (coronary artery disease)    a. diffuse 3v CAD by cath 2019, medical therapy recommended.   Cataract    forming    Chronic systolic CHF (congestive heart failure) (HCC)    CVA (cerebral infarction)    No residual deficits   Depression    PTSD,    Diabetes mellitus    TYPE II; UNCONTROLLED BY HEMOGLOBIN A1c; STABLE AS  PER DISCHARGE   Headache(784.0)    Herpes simplex of male genitalia    History of colonic polyps    Hyperlipidemia    Hypertension    Myocardial infarction ([place]) 1987   (while playing football)   Obesity    OSA (obstructive sleep apnea)    repeat study 2018 without significant OSA   Pneumonia    Post-cardiac injury syndrome ([place])    History of cardiac injury from blunt trauma   Pulmonary hypertension ([place])    a. moderately elevated PASP 07/2019.   PVCs (premature ventricular contractions)    Schizophrenia ([place])    Goes to [facility]   Sleep apnea    Stroke [facility]) 2005   some left side weakness   Syncope    Recurrent, thought to be vasovagal. Also has h/o frequent PVCs.     Past Surgical History:  Procedure Laterality Date   AMPUTATION Right 01/25/2021   Procedure: RIGHT BELOW KNEE AMPUTATION;  Surgeon: [name], MD;  Location: [place];  Service:  Orthopedics;  Laterality: Right;   CARDIAC CATHETERIZATION  12/19/10   DIFFUSE NONOBSTRUCTIVE CAD; NONISCHEMIC CARDIOMYOPATHY; LEFT VENTRICULAR ANGIOGRAM WAS PERFORMED SECONDARY TO  ELEVATED LEFT VENTRICULAR FILLING PRESSURES   COLONOSCOPY     ~ age 63-21   COLONOSCOPY W/ POLYPECTOMY     I & D EXTREMITY Bilateral 10/24/2019   Procedure: IRRIGATION AND DEBRIDEMENT BILATERAL EXTREMITY WOUND ON FOOT;  Surgeon: Cindra Presume, MD;  Location: [place];  Service: Plastics;  Laterality: Bilateral;   ICD IMPLANT N/A 01/13/2022   Procedure: ICD IMPLANT;  Surgeon: Evans Lance, MD;  Location: [place] [facility];  Service: Cardiovascular;  Laterality: N/A;   METATARSAL HEAD EXCISION Right 08/29/2018   Procedure: METATARSAL HEAD RESECTION;  Surgeon: Edrick Kins, DPM;  Location: [place];  Service: Podiatry;  Laterality: Right;   METATARSAL OSTEOTOMY Right 08/29/2018   Procedure: SUB FIFTHE METATARSIA RIGHT FOOT;  Surgeon: Edrick Kins, DPM;  Location: [place];  Service: Podiatry;  Laterality: Right;   MULTIPLE EXTRACTIONS WITH ALVEOLOPLASTY  01/27/2014   "all my teeth; 4 Quadrants of alveoloplasty   MULTIPLE EXTRACTIONS WITH ALVEOLOPLASTY N/A 01/27/2014   Procedure: EXTRACTION OF TEETH #'1, 2, 3, 4, 5, 6, 7, 8, 9, 10, 11, 12, 13, 14,  15, 16, 17, 20, 21, 22, 23, 24, 25, 26, 27, 28, 29, 31 and 32 WITH ALVEOLOPLASTY;  Surgeon: Lenn Cal, DDS;  Location: [place];  Service: Oral Surgery;  Laterality: N/A;   ORIF FINGER / THUMB FRACTURE Right    POLYPECTOMY     RIGHT HEART CATH N/A 01/27/2021   Procedure: RIGHT HEART CATH;  Surgeon: Jolaine Artist, MD;  Location: [place] [facility];  Service: Cardiovascular;  Laterality: N/A;   RIGHT/LEFT HEART CATH AND CORONARY ANGIOGRAPHY N/A 09/26/2017   Procedure: RIGHT/LEFT HEART CATH AND CORONARY ANGIOGRAPHY;  Surgeon: Jolaine Artist, MD;  Location: [place] [facility];  Service: Cardiovascular;  Laterality: N/A;   RIGHT/LEFT HEART CATH AND CORONARY ANGIOGRAPHY  N/A 04/12/2021   Procedure: RIGHT/LEFT HEART CATH AND CORONARY ANGIOGRAPHY;  Surgeon: Jolaine Artist, MD;  Location: [place] [facility];  Service: Cardiovascular;  Laterality: N/A;   SKIN SPLIT GRAFT Bilateral 10/24/2019   Procedure: SKIN GRAFT SPLIT THICKNESS LEFT THIGH;  Surgeon: Cindra Presume, MD;  Location: [facility];  Service: Plastics;  Laterality: Bilateral;   WOUND DEBRIDEMENT Right 08/29/2018   Procedure: Debridement of ulcer on right fifth metatarsal;  Surgeon: Edrick Kins, DPM;  Location: [facility];  Service: Podiatry;  Laterality: Right;    Family History  Problem Relation Age of Onset   Heart disease Mother        MI   Heart failure Mother    Diabetes Mother        ALSO IN MOST OF HIS SIBLINGS; 2 UNLCES HAVE ALSO PASSED AWAY FROM DM   Cardiomyopathy Mother    Cancer - Ovarian Mother    Ovarian cancer Mother    Heart disease Father    Hypertension Father    Diabetes Father    Diabetes Sister    Diabetes Brother    Colon cancer Paternal Uncle    Colon cancer Paternal Uncle    Colon polyps Neg Hx    Esophageal cancer Neg Hx    Rectal cancer Neg Hx    Stomach cancer Neg Hx     Social History   Tobacco Use   Smoking status: Never   Smokeless tobacco: Never  Vaping Use   Vaping Use: Never used  Substance Use Topics   Alcohol use: No   Drug use: No    Prior to Admission medications   Medication Sig Start Date End Date Taking? Authorizing Provider  Accu-Chek Softclix Lancets lancets Use to test blood sugars up to 4 times daily as needed. 12/08/21   [name], MD  amitriptyline (ELAVIL) 10 MG tablet TAKE 1 TABLET BY MOUTH AT BEDTIME Patient taking differently: Take 10 mg by mouth at bedtime as needed for sleep. 12/12/21   [name], [name], MD  Ascorbic Acid (VITAMIN C PO) Take 1 tablet by mouth daily.    [provider]  aspirin 81 MG EC tablet Take 1 tablet (81 mg total) by mouth daily. 04/13/21   Danford, Suann Larry, MD  Blood Glucose Monitoring  Suppl (BLOOD GLUCOSE MONITOR SYSTEM) w/Device KIT Use to test blood sugar up to 4 times daily as needed. 12/08/21   [name], MD  carvedilol (COREG) 6.25 MG tablet Take 6.25 mg by mouth 2 (two) times daily. 11/30/21   [provider]  Cholecalciferol (VITAMIN D-3 PO) Take 1 capsule by mouth daily.    [provider]  clopidogrel (PLAVIX) 75 MG tablet Take 1 tablet by mouth once daily 01/30/22  Bensimhon, Shaune Pascal, MD  dapagliflozin propanediol (FARXIGA) 10 MG TABS tablet Take 1 tablet (10 mg total) by mouth daily before breakfast. Patient not taking: Reported on 01/25/2022 05/04/21   Rafael Bihari, FNP  diclofenac Sodium (VOLTAREN) 1 % GEL Apply 4 g topically 4 (four) times daily. Patient not taking: Reported on 01/25/2022 09/23/21   [name], DO  gabapentin (NEURONTIN) 100 MG capsule Take 1 capsule by mouth at bedtime Patient taking differently: Take 100 mg by mouth at bedtime as needed (pain). 07/28/21   [name], MD  glucose blood (ACCU-CHEK GUIDE) test strip Use to test blood sugars up to 4 times daily as needed. 12/08/21   [name], MD  Insulin Glargine [facility]) 100 UNIT/ML Inject 21 Units into the skin 2 (two) times daily.    [provider]  methocarbamol (ROBAXIN) 500 MG tablet TAKE 1 TABLET BY MOUTH EVERY 6 HOURS AS NEEDED FOR MUSCLE SPASM Patient taking differently: Take 500 mg by mouth every 6 (six) hours as needed for muscle spasms. 08/03/21   [name], [name], MD  oxyCODONE-acetaminophen (PERCOCET/ROXICET) 5-325 MG tablet Take 1 tablet by mouth every 8 (eight) hours as needed for severe pain. 01/05/22   [name], MD  pantoprazole (PROTONIX) 40 MG tablet Take 1 tablet (40 mg total) by mouth daily as needed (acid reflux). 02/13/22   [name], [name], MD  potassium chloride SA (KLOR-CON M) 20 MEQ tablet Take 20 mEq by mouth as needed (Take on the days that takes the prn torsemide.). Take as needed daily on same day as taking  torsemide.    [provider]  rosuvastatin (CRESTOR) 40 MG tablet Take 1 tablet by mouth once daily 01/30/22   Bensimhon, Shaune Pascal, MD  sacubitril-valsartan (ENTRESTO) 97-103 MG Take 1 tablet by mouth 2 (two) times daily. 05/04/21   Rafael Bihari, FNP  spironolactone (ALDACTONE) 25 MG tablet Take 1 tablet by mouth once daily 01/30/22   Bensimhon, Shaune Pascal, MD  torsemide (DEMADEX) 20 MG tablet Take 1 tablet (20 mg total) by mouth as needed. Patient taking differently: Take 20 mg by mouth daily as needed (edema). 04/21/21   Joette Catching, PA-C    Allergies Farxiga [dapagliflozin], Nsaids, and Trulicity [dulaglutide]   REVIEW OF SYSTEMS  Negative except as noted here or in the History of Present Illness.   PHYSICAL EXAMINATION  Initial Vital Signs Blood pressure (!) 147/108, pulse 94, temperature 98.2 F (36.8 C), temperature source Oral, resp. rate 18, height 5' 11.5" (1.816 m), weight 131.1 kg, SpO2 97 %.  Examination General: Well-developed, well-nourished male in no acute distress; appearance consistent with age of record HENT: normocephalic; atraumatic Eyes: Normal appearance Neck: supple Heart: regular rate and rhythm Lungs: Rales in bases Abdomen: soft; nondistended; nontender; bowel sounds present Extremities: No deformity; right BKA with prosthetic Neurologic: Awake, alert and oriented; motor function intact in all extremities and symmetric; no facial droop Skin: Warm and dry Psychiatric: Normal mood and affect   RESULTS  Summary of this visit's results, reviewed and interpreted by myself:   EKG Interpretation  Date/Time:  Sunday March 05 2022 03:46:07 EDT Ventricular Rate:  93 PR Interval:  199 QRS Duration: 128 QT Interval:  415 QTC Calculation: 517 R Axis:   -59 Text Interpretation: Sinus rhythm Ventricular trigeminy Borderline prolonged PR interval Left bundle branch block No significant change was found Confirmed by Shanon Rosser [phone]) on  03/05/2022 4:03:44 AM       Laboratory  Studies: Results for orders placed or performed during the hospital encounter of 03/05/22 (from the past 24 hour(s))  Basic metabolic panel     Status: Abnormal   Collection Time: 03/05/22  3:54 AM  Result Value Ref Range   Sodium 143 135 - 145 mmol/L   Potassium 3.6 3.5 - 5.1 mmol/L   Chloride 107 98 - 111 mmol/L   CO2 28 22 - 32 mmol/L   Glucose, Bld 219 (H) 70 - 99 mg/dL   BUN 19 6 - 20 mg/dL   Creatinine, Ser 1.50 (H) 0.61 - 1.24 mg/dL   Calcium 8.7 (L) 8.9 - 10.3 mg/dL   GFR, Estimated 55 (L) >60 mL/min   Anion gap 8 5 - 15  CBC with Differential     Status: Abnormal   Collection Time: 03/05/22  3:54 AM  Result Value Ref Range   WBC 8.1 4.0 - 10.5 K/uL   RBC 3.95 (L) 4.22 - 5.81 MIL/uL   Hemoglobin 11.8 (L) 13.0 - 17.0 g/dL   HCT 35.7 (L) 39.0 - 52.0 %   MCV 90.4 80.0 - 100.0 fL   MCH 29.9 26.0 - 34.0 pg   MCHC 33.1 30.0 - 36.0 g/dL   RDW 12.5 11.5 - 15.5 %   Platelets 133 (L) 150 - 400 K/uL   nRBC 0.0 0.0 - 0.2 %   Neutrophils Relative % 76 %   Neutro Abs 6.1 1.7 - 7.7 K/uL   Lymphocytes Relative 15 %   Lymphs Abs 1.2 0.7 - 4.0 K/uL   Monocytes Relative 7 %   Monocytes Absolute 0.6 0.1 - 1.0 K/uL   Eosinophils Relative 2 %   Eosinophils Absolute 0.1 0.0 - 0.5 K/uL   Basophils Relative 0 %   Basophils Absolute 0.0 0.0 - 0.1 K/uL   Immature Granulocytes 0 %   Abs Immature Granulocytes 0.02 0.00 - 0.07 K/uL   Imaging Studies: DG Chest 2 View  Result Date: 03/05/2022 CLINICAL DATA:  Heart failure, shortness of breath. EXAM: CHEST - 2 VIEW COMPARISON:  01/13/2022 FINDINGS: Heart is enlarged and the mediastinal contour is stable. Mild atelectasis is present at the lung bases. No consolidation, effusion, or pneumothorax. A single lead pacemaker device is present over the left chest. No acute osseous abnormality. IMPRESSION: 1. Mild atelectasis at the left lung base. 2. Cardiomegaly. Electronically Signed   By: Brett Fairy M.D.    On: 03/05/2022 04:28    ED COURSE and MDM  Nursing notes, initial and subsequent vitals signs, including pulse oximetry, reviewed and interpreted by myself.  Vitals:   03/05/22 0333 03/05/22 0334 03/05/22 0400  BP: (!) 147/108  (!) 146/126  Pulse: 94  60  Resp: 18  18  Temp: 98.2 F (36.8 C)    TempSrc: Oral    SpO2: 97%  96%  Weight:  131.1 kg   Height:  5' 11.5" (1.816 m)    Medications  furosemide (LASIX) injection 40 mg (40 mg Intravenous Given 03/05/22 0401)   4:32 AM Patient given Lasix 40 mg IV.  He has started diuresing.  We will refill his torsemide prescription.   PROCEDURES  Procedures   ED DIAGNOSES     ICD-10-CM   1. Acute on chronic congestive heart failure, unspecified heart failure type ([place])  I50.9     2. Medication refill  Z76.0     3. Hyperglycemia  R73.9          Runell Kovich, Jenny Reichmann, MD 03/05/22 [phone]

## 2022-03-05 NOTE — ED Triage Notes (Signed)
Pt presents to the ED with increased SHOB. Reports running out of torsemide on Thursday and being out of refills. Pt reports increased SHOB when he runs out of this medication. Pt also endorses coughing

## 2022-03-08 ENCOUNTER — Encounter (HOSPITAL_BASED_OUTPATIENT_CLINIC_OR_DEPARTMENT_OTHER): Payer: Self-pay | Admitting: Emergency Medicine

## 2022-03-08 ENCOUNTER — Telehealth: Payer: Self-pay | Admitting: Orthopedic Surgery

## 2022-03-08 ENCOUNTER — Emergency Department (HOSPITAL_BASED_OUTPATIENT_CLINIC_OR_DEPARTMENT_OTHER)
Admission: EM | Admit: 2022-03-08 | Discharge: 2022-03-08 | Disposition: A | Discharge status: Home / Self Care | Payer: No Typology Code available for payment source | Attending: Emergency Medicine | Admitting: Emergency Medicine

## 2022-03-08 ENCOUNTER — Emergency Department (HOSPITAL_BASED_OUTPATIENT_CLINIC_OR_DEPARTMENT_OTHER): Payer: No Typology Code available for payment source | Admitting: Radiology

## 2022-03-08 DIAGNOSIS — Z7982 Long term (current) use of aspirin: Secondary | ICD-10-CM | POA: Insufficient documentation

## 2022-03-08 DIAGNOSIS — R2241 Localized swelling, mass and lump, right lower limb: Secondary | ICD-10-CM | POA: Insufficient documentation

## 2022-03-08 DIAGNOSIS — R109 Unspecified abdominal pain: Secondary | ICD-10-CM

## 2022-03-08 DIAGNOSIS — R809 Proteinuria, unspecified: Secondary | ICD-10-CM | POA: Insufficient documentation

## 2022-03-08 DIAGNOSIS — R1032 Left lower quadrant pain: Secondary | ICD-10-CM | POA: Insufficient documentation

## 2022-03-08 DIAGNOSIS — R319 Hematuria, unspecified: Secondary | ICD-10-CM | POA: Insufficient documentation

## 2022-03-08 DIAGNOSIS — M7989 Other specified soft tissue disorders: Secondary | ICD-10-CM

## 2022-03-08 LAB — URINALYSIS, ROUTINE W REFLEX MICROSCOPIC
Bilirubin Urine: NEGATIVE
Glucose, UA: NEGATIVE mg/dL
Ketones, ur: NEGATIVE mg/dL
Leukocytes,Ua: NEGATIVE
Nitrite: NEGATIVE
Protein, ur: 100 mg/dL — AB
Specific Gravity, Urine: 1.014 (ref 1.005–1.030)
pH: 5.5 (ref 5.0–8.0)

## 2022-03-08 LAB — CBC WITH DIFFERENTIAL/PLATELET
Abs Immature Granulocytes: 0.05 10*3/uL (ref 0.00–0.07)
Basophils Absolute: 0 10*3/uL (ref 0.0–0.1)
Basophils Relative: 0 %
Eosinophils Absolute: 0.1 10*3/uL (ref 0.0–0.5)
Eosinophils Relative: 1 %
HCT: 37.6 % — ABNORMAL LOW (ref 39.0–52.0)
Hemoglobin: 12.5 g/dL — ABNORMAL LOW (ref 13.0–17.0)
Immature Granulocytes: 1 %
Lymphocytes Relative: 7 %
Lymphs Abs: 0.8 10*3/uL (ref 0.7–4.0)
MCH: 29.8 pg (ref 26.0–34.0)
MCHC: 33.2 g/dL (ref 30.0–36.0)
MCV: 89.7 fL (ref 80.0–100.0)
Monocytes Absolute: 0.8 10*3/uL (ref 0.1–1.0)
Monocytes Relative: 8 %
Neutro Abs: 9.2 10*3/uL — ABNORMAL HIGH (ref 1.7–7.7)
Neutrophils Relative %: 83 %
Platelets: 155 10*3/uL (ref 150–400)
RBC: 4.19 MIL/uL — ABNORMAL LOW (ref 4.22–5.81)
RDW: 12.4 % (ref 11.5–15.5)
WBC: 10.9 10*3/uL — ABNORMAL HIGH (ref 4.0–10.5)
nRBC: 0 % (ref 0.0–0.2)

## 2022-03-08 LAB — BASIC METABOLIC PANEL
Anion gap: 11 (ref 5–15)
BUN: 18 mg/dL (ref 6–20)
CO2: 25 mmol/L (ref 22–32)
Calcium: 8.9 mg/dL (ref 8.9–10.3)
Chloride: 106 mmol/L (ref 98–111)
Creatinine, Ser: 1.28 mg/dL — ABNORMAL HIGH (ref 0.61–1.24)
GFR, Estimated: >60 mL/min (ref >60)
Glucose, Bld: 224 mg/dL — ABNORMAL HIGH (ref 70–99)
Potassium: 3.8 mmol/L (ref 3.5–5.1)
Sodium: 142 mmol/L (ref 135–145)

## 2022-03-08 LAB — SEDIMENTATION RATE: Sed Rate: 20 mm/hr — ABNORMAL HIGH (ref 0–16)

## 2022-03-08 MED ORDER — OXYCODONE-ACETAMINOPHEN 5-325 MG PO TABS
1.0000 | ORAL_TABLET | Freq: Once | ORAL | Status: AC
  Administered 2022-03-08: 1 via ORAL
  Filled 2022-03-08: qty 1

## 2022-03-08 MED ORDER — DOXYCYCLINE HYCLATE 100 MG PO CAPS: 100.0000 mg | ORAL_CAPSULE | Freq: Two times a day (BID) | ORAL | 0 refills | Status: DC

## 2022-03-08 NOTE — ED Notes (Signed)
Attempted to get pt to urinate for urine sample, pt states he still does not feel that he is able to urinate at this time.

## 2022-03-08 NOTE — Discharge Instructions (Addendum)
Follow up with primary doctor, ortho doctor.  Have urine repeated to ensure blood clears. Minimize walking on stump for a few days.  Take antibiotics for 5 days twice a day.

## 2022-03-08 NOTE — ED Notes (Signed)
Patient transported to X-ray 

## 2022-03-08 NOTE — Telephone Encounter (Signed)
Pt called with an FYI. Pt states his stump swollen and went to ED and they sent him to [facility]. Pt phone number is [phone]

## 2022-03-08 NOTE — ED Notes (Signed)
Discharge instructions, follow up care, and prescription reviewed and explained, pt verbalized understanding. Pt caox4 and in no obvious distress on departure.

## 2022-03-08 NOTE — Telephone Encounter (Signed)
Ed notes states that he has hx for CHF, ran out of torsemide, been short of breath recently.

## 2022-03-08 NOTE — ED Triage Notes (Signed)
Pt here from work with c/o pain to the stump on the right leg , warm to touch , no fevers , amputation 51 year old

## 2022-03-14 ENCOUNTER — Other Ambulatory Visit (HOSPITAL_COMMUNITY): Payer: Self-pay | Admitting: Internal Medicine

## 2022-03-15 ENCOUNTER — Emergency Department (HOSPITAL_BASED_OUTPATIENT_CLINIC_OR_DEPARTMENT_OTHER): Payer: No Typology Code available for payment source | Admitting: Radiology

## 2022-03-15 ENCOUNTER — Encounter (HOSPITAL_BASED_OUTPATIENT_CLINIC_OR_DEPARTMENT_OTHER): Payer: Self-pay

## 2022-03-15 ENCOUNTER — Emergency Department (HOSPITAL_BASED_OUTPATIENT_CLINIC_OR_DEPARTMENT_OTHER)
Admission: EM | Admit: 2022-03-15 | Discharge: 2022-03-15 | Disposition: A | Discharge status: Home / Self Care | Payer: No Typology Code available for payment source | Source: Home / Self Care | Attending: Emergency Medicine | Admitting: Emergency Medicine

## 2022-03-15 ENCOUNTER — Emergency Department (HOSPITAL_BASED_OUTPATIENT_CLINIC_OR_DEPARTMENT_OTHER): Payer: No Typology Code available for payment source

## 2022-03-15 ENCOUNTER — Encounter (HOSPITAL_BASED_OUTPATIENT_CLINIC_OR_DEPARTMENT_OTHER): Payer: Self-pay | Admitting: Emergency Medicine

## 2022-03-15 ENCOUNTER — Emergency Department (HOSPITAL_BASED_OUTPATIENT_CLINIC_OR_DEPARTMENT_OTHER)
Admission: EM | Admit: 2022-03-15 | Discharge: 2022-03-15 | Discharge status: Against Medical Advice | Payer: No Typology Code available for payment source | Attending: Emergency Medicine | Admitting: Emergency Medicine

## 2022-03-15 DIAGNOSIS — Z7982 Long term (current) use of aspirin: Secondary | ICD-10-CM | POA: Insufficient documentation

## 2022-03-15 DIAGNOSIS — Z7902 Long term (current) use of antithrombotics/antiplatelets: Secondary | ICD-10-CM | POA: Insufficient documentation

## 2022-03-15 DIAGNOSIS — R0602 Shortness of breath: Secondary | ICD-10-CM | POA: Insufficient documentation

## 2022-03-15 DIAGNOSIS — R109 Unspecified abdominal pain: Secondary | ICD-10-CM | POA: Insufficient documentation

## 2022-03-15 DIAGNOSIS — R7989 Other specified abnormal findings of blood chemistry: Secondary | ICD-10-CM | POA: Insufficient documentation

## 2022-03-15 DIAGNOSIS — Z5321 Procedure and treatment not carried out due to patient leaving prior to being seen by health care provider: Secondary | ICD-10-CM | POA: Insufficient documentation

## 2022-03-15 DIAGNOSIS — I509 Heart failure, unspecified: Secondary | ICD-10-CM

## 2022-03-15 LAB — COMPREHENSIVE METABOLIC PANEL
ALT: 9 U/L (ref 0–44)
AST: 13 U/L — ABNORMAL LOW (ref 15–41)
Albumin: 3.8 g/dL (ref 3.5–5.0)
Alkaline Phosphatase: 55 U/L (ref 38–126)
Anion gap: 10 (ref 5–15)
BUN: 27 mg/dL — ABNORMAL HIGH (ref 6–20)
CO2: 24 mmol/L (ref 22–32)
Calcium: 8.8 mg/dL — ABNORMAL LOW (ref 8.9–10.3)
Chloride: 104 mmol/L (ref 98–111)
Creatinine, Ser: 1.52 mg/dL — ABNORMAL HIGH (ref 0.61–1.24)
GFR, Estimated: 54 mL/min — ABNORMAL LOW (ref >60)
Glucose, Bld: 177 mg/dL — ABNORMAL HIGH (ref 70–99)
Potassium: 3.6 mmol/L (ref 3.5–5.1)
Sodium: 138 mmol/L (ref 135–145)
Total Bilirubin: 0.4 mg/dL (ref 0.3–1.2)
Total Protein: 7.1 g/dL (ref 6.5–8.1)

## 2022-03-15 LAB — URINALYSIS, ROUTINE W REFLEX MICROSCOPIC
Bilirubin Urine: NEGATIVE
Glucose, UA: NEGATIVE mg/dL
Ketones, ur: NEGATIVE mg/dL
Leukocytes,Ua: NEGATIVE
Nitrite: NEGATIVE
Protein, ur: 30 mg/dL — AB
Specific Gravity, Urine: 1.01 (ref 1.005–1.030)
pH: 5 (ref 5.0–8.0)

## 2022-03-15 LAB — CBC
HCT: 35.7 % — ABNORMAL LOW (ref 39.0–52.0)
Hemoglobin: 11.6 g/dL — ABNORMAL LOW (ref 13.0–17.0)
MCH: 29.3 pg (ref 26.0–34.0)
MCHC: 32.5 g/dL (ref 30.0–36.0)
MCV: 90.2 fL (ref 80.0–100.0)
Platelets: 205 10*3/uL (ref 150–400)
RBC: 3.96 MIL/uL — ABNORMAL LOW (ref 4.22–5.81)
RDW: 12.1 % (ref 11.5–15.5)
WBC: 12 10*3/uL — ABNORMAL HIGH (ref 4.0–10.5)
nRBC: 0 % (ref 0.0–0.2)

## 2022-03-15 LAB — BRAIN NATRIURETIC PEPTIDE: B Natriuretic Peptide: 373.2 pg/mL — ABNORMAL HIGH (ref 0.0–100.0)

## 2022-03-15 LAB — TROPONIN I (HIGH SENSITIVITY): Troponin I (High Sensitivity): 15 ng/L (ref <18)

## 2022-03-15 LAB — LIPASE, BLOOD: Lipase: 27 U/L (ref 11–51)

## 2022-03-15 MED ORDER — MORPHINE SULFATE (PF) 4 MG/ML IV SOLN
4.0000 mg | Freq: Once | INTRAVENOUS | Status: AC
  Administered 2022-03-15: 4 mg via INTRAVENOUS
  Filled 2022-03-15: qty 1

## 2022-03-15 MED ORDER — FUROSEMIDE 10 MG/ML IJ SOLN
40.0000 mg | Freq: Once | INTRAMUSCULAR | Status: AC
  Administered 2022-03-15: 40 mg via INTRAVENOUS
  Filled 2022-03-15: qty 4

## 2022-03-15 MED ORDER — CYCLOBENZAPRINE HCL 5 MG PO TABS: 5.0000 mg | ORAL_TABLET | Freq: Three times a day (TID) | ORAL | 0 refills | Status: DC | PRN

## 2022-03-15 NOTE — ED Provider Notes (Signed)
[facility] EMERGENCY DEPT Provider Note   CSN: [id number] Arrival date & time: 03/15/22  1805     History  Chief Complaint  Patient presents with   Abdominal Pain    Mitchell [name] is a 52 y.o. male history of heart failure, previous right above-the-knee amputation, here presenting with shortness of breath and left flank pain.  Patient has been having left flank pain for the last several weeks.  Patient was seen about a week ago because he was out of his torsemide and he had labs done at that time and was given Lasix and was discharged home with torsemide.  He states that his leg swelling is stable.  He states that he does have some worsening shortness of breath.  Denies any chest pain.  He did not mention any left flank pain at that time.  Since then he states that the pain has gotten worse.  Denies any nausea or vomiting or fever  The history is provided by the patient.       Home Medications Prior to Admission medications   Medication Sig Start Date End Date Taking? Authorizing Provider  Accu-Chek Softclix Lancets lancets Use to test blood sugars up to 4 times daily as needed. 12/08/21   [name], MD  amitriptyline (ELAVIL) 10 MG tablet TAKE 1 TABLET BY MOUTH AT BEDTIME Patient taking differently: Take 10 mg by mouth at bedtime as needed for sleep. 12/12/21   [name], [name], MD  Ascorbic Acid (VITAMIN C PO) Take 1 tablet by mouth daily.    [provider]  aspirin 81 MG EC tablet Take 1 tablet (81 mg total) by mouth daily. 04/13/21   Danford, Suann Larry, MD  Blood Glucose Monitoring Suppl (BLOOD GLUCOSE MONITOR SYSTEM) w/Device KIT Use to test blood sugar up to 4 times daily as needed. 12/08/21   [name], MD  carvedilol (COREG) 6.25 MG tablet Take 6.25 mg by mouth 2 (two) times daily. 11/30/21   [provider]  Cholecalciferol (VITAMIN D-3 PO) Take 1 capsule by mouth daily.    [provider]  clopidogrel (PLAVIX) 75 MG  tablet Take 1 tablet by mouth once daily 01/30/22   Bensimhon, Shaune Pascal, MD  dapagliflozin propanediol (FARXIGA) 10 MG TABS tablet Take 1 tablet (10 mg total) by mouth daily before breakfast. Patient not taking: Reported on 01/25/2022 05/04/21   Rafael Bihari, FNP  diclofenac Sodium (VOLTAREN) 1 % GEL Apply 4 g topically 4 (four) times daily. Patient not taking: Reported on 01/25/2022 09/23/21   [name], DO  doxycycline (VIBRAMYCIN) 100 MG capsule Take 1 capsule (100 mg total) by mouth 2 (two) times daily. One po bid x 7 days 03/08/22   [name], MD  gabapentin (NEURONTIN) 100 MG capsule Take 1 capsule by mouth at bedtime Patient taking differently: Take 100 mg by mouth at bedtime as needed (pain). 07/28/21   [name], MD  glucose blood (ACCU-CHEK GUIDE) test strip Use to test blood sugars up to 4 times daily as needed. 12/08/21   [name], MD  Insulin Glargine [facility]) 100 UNIT/ML Inject 21 Units into the skin 2 (two) times daily.    [provider]  methocarbamol (ROBAXIN) 500 MG tablet TAKE 1 TABLET BY MOUTH EVERY 6 HOURS AS NEEDED FOR MUSCLE SPASM Patient taking differently: Take 500 mg by mouth every 6 (six) hours as needed for muscle spasms. 08/03/21   [name], [name], MD  oxyCODONE-acetaminophen (PERCOCET/ROXICET) 5-325 MG tablet  Take 1 tablet by mouth every 8 (eight) hours as needed for severe pain. 01/05/22   [name], MD  pantoprazole (PROTONIX) 40 MG tablet Take 1 tablet (40 mg total) by mouth daily as needed (acid reflux). 02/13/22   [name], [name], MD  potassium chloride SA (KLOR-CON M) 20 MEQ tablet Take 20 mEq by mouth as needed (Take on the days that takes the prn torsemide.). Take as needed daily on same day as taking torsemide.    [provider]  rosuvastatin (CRESTOR) 40 MG tablet Take 1 tablet by mouth once daily 01/30/22   Bensimhon, Shaune Pascal, MD  sacubitril-valsartan (ENTRESTO) 97-103 MG Take 1 tablet by mouth 2 (two) times  daily. 05/04/21   Rafael Bihari, FNP  spironolactone (ALDACTONE) 25 MG tablet Take 1 tablet by mouth once daily 01/30/22   Bensimhon, Shaune Pascal, MD  torsemide (DEMADEX) 20 MG tablet Take 1 tablet (20 mg total) by mouth as needed. 03/05/22   Molpus, Jenny Reichmann, MD      Allergies    [name] [dapagliflozin], Nsaids, and Trulicity [dulaglutide]    Review of Systems   Review of Systems  Gastrointestinal:  Positive for abdominal pain.  All other systems reviewed and are negative.   Physical Exam Updated Vital Signs BP (!) 151/85 (BP Location: Right Arm)   Pulse 74   Temp 98.8 F (37.1 C)   Resp 18   SpO2 98%  Physical Exam Vitals and nursing note reviewed.  HENT:     Head: Normocephalic.  Eyes:     Extraocular Movements: Extraocular movements intact.  Cardiovascular:     Rate and Rhythm: Normal rate and regular rhythm.     Heart sounds: Normal heart sounds.  Pulmonary:     Effort: Pulmonary effort is normal.     Comments: Diminished bilateral bases Abdominal:     General: Abdomen is flat.     Comments: Mild left CVA tenderness  Skin:    General: Skin is warm.  Neurological:     General: No focal deficit present.     Mental Status: He is alert and oriented to person, place, and time.  Psychiatric:        Mood and Affect: Mood normal.        Behavior: Behavior normal.     ED Results / Procedures / Treatments   Labs (all labs ordered are listed, but only abnormal results are displayed) Labs Reviewed  COMPREHENSIVE METABOLIC PANEL - Abnormal; Notable for the following components:      Result Value   Glucose, Bld 177 (*)    BUN 27 (*)    Creatinine, Ser 1.52 (*)    Calcium 8.8 (*)    AST 13 (*)    GFR, Estimated 54 (*)    All other components within normal limits  CBC - Abnormal; Notable for the following components:   WBC 12.0 (*)    RBC 3.96 (*)    Hemoglobin 11.6 (*)    HCT 35.7 (*)    All other components within normal limits  URINALYSIS, ROUTINE W REFLEX  MICROSCOPIC - Abnormal; Notable for the following components:   Hgb urine dipstick SMALL (*)    Protein, ur 30 (*)    All other components within normal limits  LIPASE, BLOOD  BRAIN NATRIURETIC PEPTIDE  TROPONIN I (HIGH SENSITIVITY)    EKG None  Radiology No results found.  Procedures Procedures    Medications Ordered in ED Medications  morphine (PF) 4 MG/ML  injection 4 mg (has no administration in time range)  furosemide (LASIX) injection 40 mg (has no administration in time range)    ED Course/ Medical Decision Making/ A&P                           Medical Decision Making Mitchell [name] is a 51 y.o. male here presenting with left flank pain and shortness of breath.  Patient has history of heart failure.  Patient was seen for shortness of breath about a week ago and was given Lasix and takes torsemide.  He has left flank pain for the last several weeks.  Consider musculoskeletal versus renal colic versus pyelonephritis versus heart failure exacerbation.  Plan to get CBC and CMP and BNP and chest x-ray and CT renal stone and urinalysis.  11:27 PM I reviewed patient's labs and independently interpreted imaging studies.  Patient's BNP is slightly elevated at 373.  Patient was given Lasix in the ED.  Patient's urinalysis was unremarkable.  CT renal stone showed no hydronephrosis.  I think likely MSK in nature.  Will prescribe some Flexeril.  He states that he is able to fill his torsemide tomorrow.  I did encourage him to fill his prescription and follow-up with his doctor.  Problems Addressed: Chronic congestive heart failure, unspecified heart failure type [facility]): acute illness or injury Flank pain: acute illness or injury  Amount and/or Complexity of Data Reviewed Labs: ordered. Decision-making details documented in ED Course. Radiology: ordered and independent interpretation performed. Decision-making details documented in ED Course. ECG/medicine tests: ordered and  independent interpretation performed. Decision-making details documented in ED Course.  Risk Prescription drug management.    Final Clinical Impression(s) / ED Diagnoses Final diagnoses:  None    Rx / DC Orders ED Discharge Orders     None         Drenda Freeze, MD 03/15/22 2328

## 2022-03-15 NOTE — ED Notes (Signed)
Pt c/o left abdominal and flank pain x 1 month, NAD at this time

## 2022-03-15 NOTE — ED Notes (Signed)
Lab called to add labs to prior blood work collected

## 2022-03-15 NOTE — ED Notes (Signed)
Tim - RN aware of pt's BP.

## 2022-03-15 NOTE — Discharge Instructions (Addendum)
You need to fill your prescription for torsemide and take it as prescribed.  As we discussed, your urinalysis and your CT scans were normal today.  I do not think you have a kidney stone or kidney infection right now.  I think you likely have muscular pain.  I recommend taking Flexeril for muscle spasms  You need to see your primary care doctor.  Return to ER if you have worse shortness of breath, flank pain, chest pain

## 2022-03-15 NOTE — ED Triage Notes (Signed)
He c/o waxing and waning left abd./flank pain x over a month. He is in no distress. Prosthetic right leg in place.

## 2022-03-15 NOTE — ED Triage Notes (Signed)
Pt c/o LT flank pain for "a while"

## 2022-03-22 ENCOUNTER — Encounter: Payer: Self-pay | Admitting: Family

## 2022-03-22 ENCOUNTER — Other Ambulatory Visit: Payer: Self-pay

## 2022-03-22 ENCOUNTER — Ambulatory Visit (INDEPENDENT_AMBULATORY_CARE_PROVIDER_SITE_OTHER): Payer: No Typology Code available for payment source | Admitting: Family

## 2022-03-22 DIAGNOSIS — Z89511 Acquired absence of right leg below knee: Secondary | ICD-10-CM

## 2022-03-22 NOTE — Progress Notes (Signed)
Office Visit Note   Patient: Mitchell [name]           Date of Birth: 1970/08/26           MRN: [id number] Visit Date: 03/22/2022              Requested by: [name], MD [address] [place],  [place] [address] PCP: [name], MD  No chief complaint on file.     HPI: The patient is a 51 year old gentleman who presents today in follow-up.  He reports he is seen today in follow-up for his right below-knee amputation he has had some callus buildup to his residual limb from end bearing.  He has been wearing a liner liner however he is not wearing this anymore  Assessment & Plan: Visit Diagnoses: No diagnosis found.  Plan: Debrided of nonviable tissue.  There is no open area no callus at this time.  He has had callus buildup from end bearing feel that he would benefit modification to his socket to offload his anterior tibia he will follow-up with his prosthetists for this  Follow-Up Instructions: Return if symptoms worsen or fail to improve.   Ortho Exam  Patient is alert, oriented, no adenopathy, well-dressed, normal affect, normal respiratory effort. On examination of the right residual limb this is well consolidated well-healed he does have callused ulceration over the distal tibia slightly anteriorly this is 2 cm in diameter was debrided there is no underlying open area no drainage no erythema warmth  Imaging: No results found. No images are attached to the encounter.  Labs: Lab Results  Component Value Date   HGBA1C 7.8 (A) 08/19/2021   HGBA1C 7.9 (A) 06/08/2021   HGBA1C 7.8 (H) 04/11/2021   ESRSEDRATE 20 (H) 03/08/2022   ESRSEDRATE 81 (H) 10/21/2019   ESRSEDRATE 25 (H) 06/17/2018   CRP 10.3 (H) 01/28/2021   CRP 12.8 (H) 01/27/2021   CRP 21.1 (H) 01/26/2021   REPTSTATUS 01/25/2021 FINAL 01/24/2021   GRAMSTAIN  08/29/2018    RARE WBC PRESENT, PREDOMINANTLY PMN NO ORGANISMS SEEN    CULT  01/24/2021    NO GROWTH Performed at [facility], [place] [address], [place],  [address]    [place] 05/21/2018   LABORGA ENTEROBACTER SPECIES 05/21/2018   LABORGA STAPHYLOCOCCUS AUREUS 05/21/2018     Lab Results  Component Value Date   ALBUMIN 3.8 03/15/2022   ALBUMIN 3.6 01/12/2022   ALBUMIN 3.6 12/08/2021   PREALBUMIN 6.8 (L) 10/21/2019    Lab Results  Component Value Date   MG 2.3 11/29/2021   MG 1.5 (L) 04/12/2021   MG 1.8 02/16/2021   Lab Results  Component Value Date   VD25OH 11 (L) 08/16/2009    Lab Results  Component Value Date   PREALBUMIN 6.8 (L) 10/21/2019      Latest Ref Rng & Units 03/15/2022    6:38 PM 03/08/2022    9:46 AM 03/05/2022    3:54 AM  CBC EXTENDED  WBC 4.0 - 10.5 K/uL 12.0  10.9  8.1   RBC 4.22 - 5.81 MIL/uL 3.96  4.19  3.95   Hemoglobin 13.0 - 17.0 g/dL 11.6  12.5  11.8   HCT 39.0 - 52.0 % 35.7  37.6  35.7   Platelets 150 - 400 K/uL 205  155  133   NEUT# 1.7 - 7.7 K/uL  9.2  6.1   Lymph# 0.7 - 4.0 K/uL  0.8  1.2      There is no height or weight on file to calculate BMI.  Orders:  No orders of the defined types were placed in this encounter.  No orders of the defined types were placed in this encounter.    Procedures: No procedures performed  Clinical Data: No additional findings.  ROS:  All other systems negative, except as noted in the HPI. Review of Systems  Objective: Vital Signs: There were no vitals taken for this visit.  Specialty Comments:  No specialty comments available.  PMFS History: Patient Active Problem List   Diagnosis Date Noted   Near syncope 12/08/2021   CKD (chronic kidney disease) stage 3, GFR 30-59 ml/min (HCC) 12/08/2021   PVD s/p R LE amputation  12/08/2021   Diabetic retinopathy associated with type 2 diabetes mellitus (HCC) 06/06/2021   CAD (coronary artery disease) with flat troponin  04/11/2021   S/P below knee amputation, right (HCC) 02/07/2021   QT prolongation 07/30/2019   Acute on chronic  combined systolic and diastolic CHF (congestive heart failure) (HCC) 07/29/2019   Stroke (HCC) 05/08/2017   Uncontrolled type 2 diabetes mellitus with hyperglycemia, with long-term current use of insulin (HCC) 12/07/2015   Chronic systolic heart failure (HCC) 10/02/2011   Erectile dysfunction 10/02/2011   Obstructive sleep apnea 10/11/2007   HLD (hyperlipidemia) 10/10/2007   HYPOKALEMIA 10/10/2007   Obesity, Class III, BMI 40-49.9 (morbid obesity) (HCC) 10/10/2007   Essential hypertension 10/10/2007   PREMATURE VENTRICULAR CONTRACTIONS 10/10/2007   COLONIC POLYPS, HX OF 10/10/2007   Past Medical History:  Diagnosis Date   Bipolar disorder (HCC)    CAD (coronary artery disease)    a. diffuse 3v CAD by cath 2019, medical therapy recommended.   Cataract    forming    Chronic systolic CHF (congestive heart failure) (HCC)    CVA (cerebral infarction)    No residual deficits   Depression    PTSD,    Diabetes mellitus    TYPE II; UNCONTROLLED BY HEMOGLOBIN A1c; STABLE AS  PER DISCHARGE   Headache(784.0)    Herpes simplex of male genitalia    History of colonic polyps    Hyperlipidemia    Hypertension    Myocardial infarction (HCC) 1987   (while playing football)   Obesity    OSA (obstructive sleep apnea)    repeat study 2018 without significant OSA   Pneumonia    Post-cardiac injury syndrome (HCC)    History of cardiac injury from blunt trauma   Pulmonary hypertension (HCC)    a. moderately elevated PASP 07/2019.   PVCs (premature ventricular contractions)    Schizophrenia (HCC)    Goes to [facility]   Sleep apnea    Stroke [facility]) 2005   some left side weakness   Syncope    Recurrent, thought to be vasovagal. Also has h/o frequent PVCs.     Family History  Problem Relation Age of Onset   Heart disease Mother        MI   Heart failure Mother    Diabetes Mother        ALSO IN MOST OF HIS SIBLINGS; 2 UNLCES HAVE ALSO PASSED AWAY FROM [facility]    Cardiomyopathy Mother    Cancer - Ovarian Mother    Ovarian cancer Mother    Heart disease Father    Hypertension Father    Diabetes Father    Diabetes Sister    Diabetes Brother    Colon cancer Paternal Uncle  Colon cancer Paternal Uncle    Colon polyps Neg Hx    Esophageal cancer Neg Hx    Rectal cancer Neg Hx    Stomach cancer Neg Hx     Past Surgical History:  Procedure Laterality Date   AMPUTATION Right 01/25/2021   Procedure: RIGHT BELOW KNEE AMPUTATION;  Surgeon: Nadara Mustard, MD;  Location: [facility] OR;  Service: Orthopedics;  Laterality: Right;   CARDIAC CATHETERIZATION  12/19/10   DIFFUSE NONOBSTRUCTIVE CAD; NONISCHEMIC CARDIOMYOPATHY; LEFT VENTRICULAR ANGIOGRAM WAS PERFORMED SECONDARY TO  ELEVATED LEFT VENTRICULAR FILLING PRESSURES   COLONOSCOPY     ~ age 58-23   COLONOSCOPY W/ POLYPECTOMY     I & D EXTREMITY Bilateral 10/24/2019   Procedure: IRRIGATION AND DEBRIDEMENT BILATERAL EXTREMITY WOUND ON FOOT;  Surgeon: Allena Napoleon, MD;  Location: [facility] OR;  Service: Plastics;  Laterality: Bilateral;   ICD IMPLANT N/A 01/13/2022   Procedure: ICD IMPLANT;  Surgeon: Marinus Maw, MD;  Location: [facility] INVASIVE CV LAB;  Service: Cardiovascular;  Laterality: N/A;   METATARSAL HEAD EXCISION Right 08/29/2018   Procedure: METATARSAL HEAD RESECTION;  Surgeon: Felecia Shelling, DPM;  Location: [facility] OR;  Service: Podiatry;  Laterality: Right;   METATARSAL OSTEOTOMY Right 08/29/2018   Procedure: SUB FIFTHE METATARSIA RIGHT FOOT;  Surgeon: Felecia Shelling, DPM;  Location: [facility] OR;  Service: Podiatry;  Laterality: Right;   MULTIPLE EXTRACTIONS WITH ALVEOLOPLASTY  01/27/2014   "all my teeth; 4 Quadrants of alveoloplasty   MULTIPLE EXTRACTIONS WITH ALVEOLOPLASTY N/A 01/27/2014   Procedure: EXTRACTION OF TEETH #'1, 2, 3, 4, 5, 6, 7, 8, 9, 10, 11, 12, 13, 14, 15, 16, 17, 20, 21, 22, 23, 24, 25, 26, 27, 28, 29, 31 and 32 WITH ALVEOLOPLASTY;  Surgeon: Charlynne Pander, DDS;  Location: [facility] OR;  Service: Oral  Surgery;  Laterality: N/A;   ORIF FINGER / THUMB FRACTURE Right    POLYPECTOMY     RIGHT HEART CATH N/A 01/27/2021   Procedure: RIGHT HEART CATH;  Surgeon: Dolores Patty, MD;  Location: [facility] INVASIVE CV LAB;  Service: Cardiovascular;  Laterality: N/A;   RIGHT/LEFT HEART CATH AND CORONARY ANGIOGRAPHY N/A 09/26/2017   Procedure: RIGHT/LEFT HEART CATH AND CORONARY ANGIOGRAPHY;  Surgeon: Dolores Patty, MD;  Location: [facility] INVASIVE CV LAB;  Service: Cardiovascular;  Laterality: N/A;   RIGHT/LEFT HEART CATH AND CORONARY ANGIOGRAPHY N/A 04/12/2021   Procedure: RIGHT/LEFT HEART CATH AND CORONARY ANGIOGRAPHY;  Surgeon: Dolores Patty, MD;  Location: [facility] INVASIVE CV LAB;  Service: Cardiovascular;  Laterality: N/A;   SKIN SPLIT GRAFT Bilateral 10/24/2019   Procedure: SKIN GRAFT SPLIT THICKNESS LEFT THIGH;  Surgeon: Allena Napoleon, MD;  Location: [facility] OR;  Service: Plastics;  Laterality: Bilateral;   WOUND DEBRIDEMENT Right 08/29/2018   Procedure: Debridement of ulcer on right fifth metatarsal;  Surgeon: Felecia Shelling, DPM;  Location: [facility] OR;  Service: Podiatry;  Laterality: Right;   Social History   Occupational History   Occupation: Mortician  Tobacco Use   Smoking status: Never   Smokeless tobacco: Never  Vaping Use   Vaping Use: Never used  Substance and Sexual Activity   Alcohol use: No   Drug use: No   Sexual activity: Not on file

## 2022-03-23 ENCOUNTER — Ambulatory Visit: Payer: No Typology Code available for payment source | Admitting: Dietician

## 2022-03-25 NOTE — ED Provider Notes (Signed)
[name] Provider Note   CSN: [id number] Arrival date & time: 03/08/22  0848     History  Chief Complaint  Patient presents with   Leg Pain    Mitchell [name] is a 51 y.o. male.  Patient presents with pain at distal stump on the right leg, warmer than usual, no fluctuance, fevers or chills.  Amputation approximately 1 year ago.  No spreading redness.  No vomiting tolerating oral liquids.  Patient denies any blood clot history.  No urinary symptoms.  Currently no shortness of breath or chest pain.       Home Medications Prior to Admission medications   Medication Sig Start Date End Date Taking? Authorizing Provider  doxycycline (VIBRAMYCIN) 100 MG capsule Take 1 capsule (100 mg total) by mouth 2 (two) times daily. One po bid x 7 days 03/08/22  Yes [name], MD  Accu-Chek Softclix Lancets lancets Use to test blood sugars up to 4 times daily as needed. 12/08/21   [name], MD  amitriptyline (ELAVIL) 10 MG tablet TAKE 1 TABLET BY MOUTH AT BEDTIME Patient taking differently: Take 10 mg by mouth at bedtime as needed for sleep. 12/12/21   [name], [name], MD  Ascorbic Acid (VITAMIN C PO) Take 1 tablet by mouth daily.    [provider]  aspirin 81 MG EC tablet Take 1 tablet (81 mg total) by mouth daily. 04/13/21   Danford, Suann Larry, MD  Blood Glucose Monitoring Suppl (BLOOD GLUCOSE MONITOR SYSTEM) w/Device KIT Use to test blood sugar up to 4 times daily as needed. 12/08/21   [name], MD  carvedilol (COREG) 6.25 MG tablet Take 6.25 mg by mouth 2 (two) times daily. 11/30/21   [provider]  Cholecalciferol (VITAMIN D-3 PO) Take 1 capsule by mouth daily.    [provider]  clopidogrel (PLAVIX) 75 MG tablet Take 1 tablet by mouth once daily 03/17/22   Bensimhon, Shaune Pascal, MD  cyclobenzaprine (FLEXERIL) 5 MG tablet Take 1 tablet (5 mg total) by mouth 3 (three) times daily as needed for muscle spasms. 03/15/22    Drenda Freeze, MD  dapagliflozin propanediol (FARXIGA) 10 MG TABS tablet Take 1 tablet (10 mg total) by mouth daily before breakfast. Patient not taking: Reported on 01/25/2022 05/04/21   Rafael Bihari, FNP  diclofenac Sodium (VOLTAREN) 1 % GEL Apply 4 g topically 4 (four) times daily. Patient not taking: Reported on 01/25/2022 09/23/21   [name], DO  gabapentin (NEURONTIN) 100 MG capsule Take 1 capsule by mouth at bedtime Patient taking differently: Take 100 mg by mouth at bedtime as needed (pain). 07/28/21   [name], MD  glucose blood (ACCU-CHEK GUIDE) test strip Use to test blood sugars up to 4 times daily as needed. 12/08/21   [name], MD  Insulin Glargine [facility]) 100 UNIT/ML Inject 21 Units into the skin 2 (two) times daily.    [provider]  methocarbamol (ROBAXIN) 500 MG tablet TAKE 1 TABLET BY MOUTH EVERY 6 HOURS AS NEEDED FOR MUSCLE SPASM Patient taking differently: Take 500 mg by mouth every 6 (six) hours as needed for muscle spasms. 08/03/21   [name], [name], MD  oxyCODONE-acetaminophen (PERCOCET/ROXICET) 5-325 MG tablet Take 1 tablet by mouth every 8 (eight) hours as needed for severe pain. 01/05/22   [name], MD  pantoprazole (PROTONIX) 40 MG tablet Take 1 tablet (40 mg total) by mouth daily as needed (acid reflux). 02/13/22  [name], [name], MD  potassium chloride SA (KLOR-CON M) 20 MEQ tablet Take 20 mEq by mouth as needed (Take on the days that takes the prn torsemide.). Take as needed daily on same day as taking torsemide.    [provider]  rosuvastatin (CRESTOR) 40 MG tablet Take 1 tablet by mouth once daily 03/17/22   Bensimhon, Shaune Pascal, MD  sacubitril-valsartan (ENTRESTO) 97-103 MG Take 1 tablet by mouth 2 (two) times daily. 05/04/21   Rafael Bihari, FNP  spironolactone (ALDACTONE) 25 MG tablet Take 1 tablet by mouth once daily 03/17/22   Bensimhon, Shaune Pascal, MD  torsemide (DEMADEX) 20 MG tablet Take 1 tablet (20  mg total) by mouth as needed. 03/05/22   Molpus, Jenny Reichmann, MD      Allergies    [name] [dapagliflozin], Nsaids, and Trulicity [dulaglutide]    Review of Systems   Review of Systems  Constitutional:  Negative for chills and fever.  HENT:  Negative for congestion.   Eyes:  Negative for visual disturbance.  Respiratory:  Negative for shortness of breath.   Cardiovascular:  Negative for chest pain.  Gastrointestinal:  Negative for abdominal pain and vomiting.  Genitourinary:  Negative for dysuria and flank pain.  Musculoskeletal:  Negative for back pain, neck pain and neck stiffness.  Skin:  Negative for rash.  Neurological:  Negative for light-headedness and headaches.    Physical Exam Updated Vital Signs BP (!) 132/90 (BP Location: Right Arm)   Pulse 94   Temp 98.3 F (36.8 C) (Oral)   Resp 17   Ht 5' 11.5" (1.816 m)   Wt 131.1 kg   SpO2 96%   BMI 39.75 kg/m  Physical Exam Vitals and nursing note reviewed.  Constitutional:      General: He is not in acute distress.    Appearance: He is well-developed.  HENT:     Head: Normocephalic and atraumatic.     Mouth/Throat:     Mouth: Mucous membranes are moist.  Eyes:     General:        Right eye: No discharge.        Left eye: No discharge.     Conjunctiva/sclera: Conjunctivae normal.  Neck:     Trachea: No tracheal deviation.  Cardiovascular:     Rate and Rhythm: Normal rate and regular rhythm.  Pulmonary:     Effort: Pulmonary effort is normal.     Breath sounds: Normal breath sounds.  Abdominal:     General: There is no distension.     Tenderness: There is no abdominal tenderness. There is no guarding.  Musculoskeletal:        General: Swelling and tenderness present.     Cervical back: Normal range of motion and neck supple. No rigidity.     Comments: Patient has mild tenderness and minimal warmth distal stump on the right leg, no significant erythema, no streaking no crepitus, mild swelling without fluctuance.  No  drainage.  Skin:    General: Skin is warm.     Capillary Refill: Capillary refill takes less than 2 seconds.     Findings: No rash.  Neurological:     General: No focal deficit present.     Mental Status: He is alert.  Psychiatric:        Mood and Affect: Mood normal.     ED Results / Procedures / Treatments   Labs (all labs ordered are listed, but only abnormal results are displayed) Labs Reviewed  BASIC  METABOLIC PANEL - Abnormal; Notable for the following components:      Result Value   Glucose, Bld 224 (*)    Creatinine, Ser 1.28 (*)    All other components within normal limits  SEDIMENTATION RATE - Abnormal; Notable for the following components:   Sed Rate 20 (*)    All other components within normal limits  CBC WITH DIFFERENTIAL/PLATELET - Abnormal; Notable for the following components:   WBC 10.9 (*)    RBC 4.19 (*)    Hemoglobin 12.5 (*)    HCT 37.6 (*)    Neutro Abs 9.2 (*)    All other components within normal limits  URINALYSIS, ROUTINE W REFLEX MICROSCOPIC - Abnormal; Notable for the following components:   Hgb urine dipstick MODERATE (*)    Protein, ur 100 (*)    All other components within normal limits    EKG None  Radiology No results found. DG Chest 2 View  Result Date: 03/15/2022 CLINICAL DATA:  Shortness of breath. EXAM: CHEST - 2 VIEW COMPARISON:  03/05/2022. FINDINGS: The heart is enlarged and the mediastinal contour is stable. A single lead pacemaker device is present over the left chest. There is elevation of the left diaphragm. No consolidation, effusion, or pneumothorax. No acute osseous abnormality. IMPRESSION: 1. No active cardiopulmonary disease. 2. Cardiomegaly. Electronically Signed   By: Brett Fairy M.D.   On: 03/15/2022 22:27   CT Renal Stone Study  Result Date: 03/15/2022 CLINICAL DATA:  Nephrolithiasis with left flank/abdominal pain. EXAM: CT ABDOMEN AND PELVIS WITHOUT CONTRAST TECHNIQUE: Multidetector CT imaging of the abdomen and  pelvis was performed following the standard protocol without IV contrast. RADIATION DOSE REDUCTION: This exam was performed according to the departmental dose-optimization program which includes automated exposure control, adjustment of the mA and/or kV according to patient size and/or use of iterative reconstruction technique. COMPARISON:  09/15/2021. FINDINGS: Lower chest: The heart is enlarged and a pacemaker lead is noted in the heart. The lung bases are clear. Hepatobiliary: No focal liver abnormality is seen. No gallstones, gallbladder wall thickening, or biliary dilatation. Pancreas: Unremarkable. No pancreatic ductal dilatation or surrounding inflammatory changes. Spleen: Normal in size without focal abnormality. Adrenals/Urinary Tract: No adrenal nodule or mass. Calcifications are present at the renal hila bilaterally which may be vascular. No definite renal calculi or hydronephrosis bilaterally. Evaluation of the kidneys is limited due to respiratory motion artifact. The bladder is unremarkable. Stable phleboliths are present in the pelvis. Stomach/Bowel: Stomach is within normal limits. Appendix appears normal. No evidence of bowel wall thickening, distention, or inflammatory changes. No free air or pneumatosis. Vascular/Lymphatic: Aortic atherosclerosis. No enlarged abdominal or pelvic lymph nodes. Reproductive: Prostate is unremarkable. Other: Fat containing inguinal hernias are noted bilaterally. No abdominopelvic ascites. Musculoskeletal: No acute osseous abnormality. IMPRESSION: No evidence of renal calculus or obstructive uropathy bilaterally. Electronically Signed   By: Brett Fairy M.D.   On: 03/15/2022 22:18   DG Knee 2 Views Right  Result Date: 03/08/2022 CLINICAL DATA:  Pain. Below-the-knee amputation. Pain to right leg stump. Warm to touch. No fevers. He amputation 1 year ago. EXAM: RIGHT KNEE - 1-2 VIEW COMPARISON:  None Available. FINDINGS: There is postsurgical change of amputation of  the right tibia and fibula to the level of the proximal diaphysis. There is mild expected cortical thickening at the distal aspect of the postsurgical sites. No definitive cortical erosion is seen. No subcutaneous air. Mild enthesopathic spurring at the quadriceps and patellar insertions on the patella and  the patellar insertion on the tibial tubercle. Mild medial compartment of the knee joint space narrowing and peripheral osteophytosis. No knee joint effusion. Mild vascular calcifications. IMPRESSION: No definitive cortical erosion is seen within the amputation sites of the proximal tibia or fibula to indicate radiographic evidence of acute osteomyelitis. Electronically Signed   By: Yvonne Kendall M.D.   On: 03/08/2022 10:06   DG Chest 2 View  Result Date: 03/05/2022 CLINICAL DATA:  Heart failure, shortness of breath. EXAM: CHEST - 2 VIEW COMPARISON:  01/13/2022 FINDINGS: Heart is enlarged and the mediastinal contour is stable. Mild atelectasis is present at the lung bases. No consolidation, effusion, or pneumothorax. A single lead pacemaker device is present over the left chest. No acute osseous abnormality. IMPRESSION: 1. Mild atelectasis at the left lung base. 2. Cardiomegaly. Electronically Signed   By: Brett Fairy M.D.   On: 03/05/2022 04:28   . Procedures Procedures    Medications Ordered in ED Medications  oxyCODONE-acetaminophen (PERCOCET/ROXICET) 5-325 MG per tablet 1 tablet (1 tablet Oral Given 03/08/22 0941)    ED Course/ Medical Decision Making/ A&P                           Medical Decision Making Amount and/or Complexity of Data Reviewed Labs: ordered. Radiology: ordered.  Risk Prescription drug management.   Patient presents with primarily right stump pain differential includes inflammation from contact/irritation, early infection/cellulitis, deep space infection/osteo, other.  Blood work ordered and overall reassuring white blood cell count 10.9, sed rate 20.  Patient is  overall well-appearing on exam normal oxygenation and afebrile.  Discussed with orthopedics on-call for his surgeon and they are comfortable with outpatient follow-up for recheck and discussed adjustment/avoiding excessive prosthetic use. X-ray of the right knee reviewed no obvious cortical disruption or signs of osteo-.  Reviewed independently and radiology result.  Plan for doxycycline and recheck in the next week.  Patient comfortable this plan.  Pain meds given in the ER.        Final Clinical Impression(s) / ED Diagnoses Final diagnoses:  Left flank pain  Right leg swelling  Hematuria with proteinuria    Rx / DC Orders ED Discharge Orders          Ordered    doxycycline (VIBRAMYCIN) 100 MG capsule  2 times daily        03/08/22 1158              [name], MD 03/25/22 [id number]

## 2022-03-26 ENCOUNTER — Other Ambulatory Visit: Payer: Self-pay

## 2022-03-26 ENCOUNTER — Emergency Department (HOSPITAL_BASED_OUTPATIENT_CLINIC_OR_DEPARTMENT_OTHER)
Admission: EM | Admit: 2022-03-26 | Discharge: 2022-03-27 | Disposition: A | Discharge status: Home / Self Care | Payer: No Typology Code available for payment source | Attending: Emergency Medicine | Admitting: Emergency Medicine

## 2022-03-26 ENCOUNTER — Encounter (HOSPITAL_BASED_OUTPATIENT_CLINIC_OR_DEPARTMENT_OTHER): Payer: Self-pay | Admitting: Emergency Medicine

## 2022-03-26 DIAGNOSIS — R519 Headache, unspecified: Secondary | ICD-10-CM | POA: Insufficient documentation

## 2022-03-26 DIAGNOSIS — I2510 Atherosclerotic heart disease of native coronary artery without angina pectoris: Secondary | ICD-10-CM | POA: Insufficient documentation

## 2022-03-26 DIAGNOSIS — Z7902 Long term (current) use of antithrombotics/antiplatelets: Secondary | ICD-10-CM | POA: Insufficient documentation

## 2022-03-26 DIAGNOSIS — Z5321 Procedure and treatment not carried out due to patient leaving prior to being seen by health care provider: Secondary | ICD-10-CM | POA: Diagnosis not present

## 2022-03-26 DIAGNOSIS — Z794 Long term (current) use of insulin: Secondary | ICD-10-CM | POA: Diagnosis not present

## 2022-03-26 DIAGNOSIS — I5022 Chronic systolic (congestive) heart failure: Secondary | ICD-10-CM | POA: Diagnosis not present

## 2022-03-26 DIAGNOSIS — R0602 Shortness of breath: Secondary | ICD-10-CM | POA: Insufficient documentation

## 2022-03-26 DIAGNOSIS — I110 Hypertensive heart disease with heart failure: Secondary | ICD-10-CM | POA: Diagnosis not present

## 2022-03-26 MED ORDER — PROCHLORPERAZINE EDISYLATE 10 MG/2ML IJ SOLN
10.0000 mg | Freq: Once | INTRAMUSCULAR | Status: AC
  Administered 2022-03-26: 10 mg via INTRAVENOUS
  Filled 2022-03-26: qty 2

## 2022-03-26 MED ORDER — DIPHENHYDRAMINE HCL 50 MG/ML IJ SOLN
25.0000 mg | Freq: Once | INTRAMUSCULAR | Status: AC
  Administered 2022-03-26: 25 mg via INTRAVENOUS
  Filled 2022-03-26: qty 1

## 2022-03-26 NOTE — ED Triage Notes (Signed)
Pt via pov from home with headache since last night. Pt reports that he took tylenol last night but has not taken any today. He states he did take regular meds today. Pt alert & oriented, nad noted.

## 2022-03-26 NOTE — ED Provider Notes (Signed)
[facility]ERG[facility] DEPT Provider Note   CSN: [id number] Arrival date & time: 03/26/22  2114     History {Add pertinent medical, surgical, social history, OB history to HPI:1} Chief Complaint  Patient presents with   Headache    Mitchell [name] is a 51 y.o. male.  HPI     This a 51 year old male with history of CVA, hypertension and a right-sided BKA who presents with headache.  Patient reports he developed a headache last night.  It was gradual in onset.  Reports that it is frontal and throbbing in nature.  He took 2 Tylenol and did have some relief of his symptoms.  However today he had recurrence of his headache.  He states he has been in bed most of the day.  Denies weakness, numbness, strokelike symptoms.  He is otherwise taking his medications.  Denies worst headache of his life.  Denies recent fevers.  Home Medications Prior to Admission medications   Medication Sig Start Date End Date Taking? Authorizing Provider  Accu-Chek Softclix Lancets lancets Use to test blood sugars up to 4 times daily as needed. 12/08/21   [name], MD  amitriptyline (ELAVIL) 10 MG tablet TAKE 1 TABLET BY MOUTH AT BEDTIME Patient taking differently: Take 10 mg by mouth at bedtime as needed for sleep. 12/12/21   [name], [name], MD  Ascorbic Acid (VITAMIN C PO) Take 1 tablet by mouth daily.    [provider]  aspirin 81 MG EC tablet Take 1 tablet (81 mg total) by mouth daily. 04/13/21   Danford, Suann Larry, MD  Blood Glucose Monitoring Suppl (BLOOD GLUCOSE MONITOR SYSTEM) w/Device KIT Use to test blood sugar up to 4 times daily as needed. 12/08/21   [name], MD  carvedilol (COREG) 6.25 MG tablet Take 6.25 mg by mouth 2 (two) times daily. 11/30/21   [provider]  Cholecalciferol (VITAMIN D-3 PO) Take 1 capsule by mouth daily.    [provider]  clopidogrel (PLAVIX) 75 MG tablet Take 1 tablet by mouth once daily 03/17/22   Bensimhon, Shaune Pascal,  MD  cyclobenzaprine (FLEXERIL) 5 MG tablet Take 1 tablet (5 mg total) by mouth 3 (three) times daily as needed for muscle spasms. 03/15/22   Drenda Freeze, MD  dapagliflozin propanediol (FARXIGA) 10 MG TABS tablet Take 1 tablet (10 mg total) by mouth daily before breakfast. Patient not taking: Reported on 01/25/2022 05/04/21   Rafael Bihari, FNP  diclofenac Sodium (VOLTAREN) 1 % GEL Apply 4 g topically 4 (four) times daily. Patient not taking: Reported on 01/25/2022 09/23/21   [name], DO  doxycycline (VIBRAMYCIN) 100 MG capsule Take 1 capsule (100 mg total) by mouth 2 (two) times daily. One po bid x 7 days 03/08/22   [name], MD  gabapentin (NEURONTIN) 100 MG capsule Take 1 capsule by mouth at bedtime Patient taking differently: Take 100 mg by mouth at bedtime as needed (pain). 07/28/21   [name], MD  glucose blood (ACCU-CHEK GUIDE) test strip Use to test blood sugars up to 4 times daily as needed. 12/08/21   [name], MD  Insulin Glargine [facility]) 100 UNIT/ML Inject 21 Units into the skin 2 (two) times daily.    [provider]  methocarbamol (ROBAXIN) 500 MG tablet TAKE 1 TABLET BY MOUTH EVERY 6 HOURS AS NEEDED FOR MUSCLE SPASM Patient taking differently: Take 500 mg by mouth every 6 (six) hours as needed for muscle spasms. 08/03/21  Izora Ribas, MD  oxyCODONE-acetaminophen (PERCOCET/ROXICET) 5-325 MG tablet Take 1 tablet by mouth every 8 (eight) hours as needed for severe pain. 01/05/22   [name], MD  pantoprazole (PROTONIX) 40 MG tablet Take 1 tablet (40 mg total) by mouth daily as needed (acid reflux). 02/13/22   [name], [name], MD  potassium chloride SA (KLOR-CON M) 20 MEQ tablet Take 20 mEq by mouth as needed (Take on the days that takes the prn torsemide.). Take as needed daily on same day as taking torsemide.    [provider]  rosuvastatin (CRESTOR) 40 MG tablet Take 1 tablet by mouth once daily 03/17/22   Bensimhon, Shaune Pascal, MD  sacubitril-valsartan (ENTRESTO) 97-103 MG Take 1 tablet by mouth 2 (two) times daily. 05/04/21   Rafael Bihari, FNP  spironolactone (ALDACTONE) 25 MG tablet Take 1 tablet by mouth once daily 03/17/22   Bensimhon, Shaune Pascal, MD  torsemide (DEMADEX) 20 MG tablet Take 1 tablet (20 mg total) by mouth as needed. 03/05/22   Molpus, Jenny Reichmann, MD      Allergies    [name] [dapagliflozin], Nsaids, and Trulicity [dulaglutide]    Review of Systems   Review of Systems  Constitutional:  Negative for fever.  Neurological:  Positive for headaches. Negative for weakness and numbness.  All other systems reviewed and are negative.   Physical Exam Updated Vital Signs BP (!) 142/76 (BP Location: Right Arm)   Pulse (!) 106   Temp 98.4 F (36.9 C) (Oral)   Resp 18   Ht 1.816 m (5' 11.5")   Wt 135.2 kg   SpO2 100%   BMI 40.99 kg/m  Physical Exam Vitals and nursing note reviewed.  Constitutional:      Appearance: He is well-developed. He is obese.     Comments: Chronically ill-appearing but nontoxic  HENT:     Head: Normocephalic and atraumatic.     Mouth/Throat:     Mouth: Mucous membranes are moist.  Eyes:     Extraocular Movements: Extraocular movements intact.     Pupils: Pupils are equal, round, and reactive to light.  Cardiovascular:     Rate and Rhythm: Normal rate and regular rhythm.     Heart sounds: Normal heart sounds. No murmur heard. Pulmonary:     Effort: Pulmonary effort is normal. No respiratory distress.     Breath sounds: Normal breath sounds. No wheezing.  Abdominal:     Palpations: Abdomen is soft.     Tenderness: There is no abdominal tenderness. There is no rebound.  Musculoskeletal:     Cervical back: Normal range of motion and neck supple.  Lymphadenopathy:     Cervical: No cervical adenopathy.  Skin:    General: Skin is warm and dry.  Neurological:     Mental Status: He is alert and oriented to person, place, and time.     Comments: Cranial nerves II  through XII intact, 5 out of 5 strength in all 4 extremities, no dysmetria to finger-nose-finger  Psychiatric:        Mood and Affect: Mood normal.     ED Results / Procedures / Treatments   Labs (all labs ordered are listed, but only abnormal results are displayed) Labs Reviewed - No data to display  EKG None  Radiology No results found.  Procedures Procedures  {Document cardiac monitor, telemetry assessment procedure when appropriate:1}  Medications Ordered in ED Medications  prochlorperazine (COMPAZINE) injection 10 mg (has no administration in time range)  diphenhydrAMINE (BENADRYL) injection 25 mg (has no administration in time range)    ED Course/ Medical Decision Making/ A&P                           Medical Decision Making Risk Prescription drug management.   ***  {Document critical care time when appropriate:1} {Document review of labs and clinical decision tools ie heart score, Chads2Vasc2 etc:1}  {Document your independent review of radiology images, and any outside records:1} {Document your discussion with family members, caretakers, and with consultants:1} {Document social determinants of health affecting pt's care:1} {Document your decision making why or why not admission, treatments were needed:1} Final Clinical Impression(s) / ED Diagnoses Final diagnoses:  None    Rx / DC Orders ED Discharge Orders     None

## 2022-03-27 ENCOUNTER — Other Ambulatory Visit: Payer: Self-pay

## 2022-03-27 ENCOUNTER — Encounter (HOSPITAL_COMMUNITY): Payer: Self-pay

## 2022-03-27 ENCOUNTER — Emergency Department (HOSPITAL_COMMUNITY): Payer: No Typology Code available for payment source

## 2022-03-27 ENCOUNTER — Emergency Department (HOSPITAL_COMMUNITY)
Admission: EM | Admit: 2022-03-27 | Discharge: 2022-03-27 | Discharge status: Against Medical Advice | Payer: No Typology Code available for payment source | Source: Home / Self Care

## 2022-03-27 DIAGNOSIS — R519 Headache, unspecified: Secondary | ICD-10-CM | POA: Insufficient documentation

## 2022-03-27 DIAGNOSIS — R0602 Shortness of breath: Secondary | ICD-10-CM | POA: Insufficient documentation

## 2022-03-27 DIAGNOSIS — Z5321 Procedure and treatment not carried out due to patient leaving prior to being seen by health care provider: Secondary | ICD-10-CM | POA: Insufficient documentation

## 2022-03-27 LAB — TROPONIN I (HIGH SENSITIVITY): Troponin I (High Sensitivity): 99 ng/L — ABNORMAL HIGH (ref <18)

## 2022-03-27 LAB — CBC WITH DIFFERENTIAL/PLATELET
Abs Immature Granulocytes: 0.07 10*3/uL (ref 0.00–0.07)
Basophils Absolute: 0 10*3/uL (ref 0.0–0.1)
Basophils Relative: 0 %
Eosinophils Absolute: 0 10*3/uL (ref 0.0–0.5)
Eosinophils Relative: 0 %
HCT: 34.4 % — ABNORMAL LOW (ref 39.0–52.0)
Hemoglobin: 11.2 g/dL — ABNORMAL LOW (ref 13.0–17.0)
Immature Granulocytes: 1 %
Lymphocytes Relative: 5 %
Lymphs Abs: 0.8 10*3/uL (ref 0.7–4.0)
MCH: 29.9 pg (ref 26.0–34.0)
MCHC: 32.6 g/dL (ref 30.0–36.0)
MCV: 91.7 fL (ref 80.0–100.0)
Monocytes Absolute: 1.1 10*3/uL — ABNORMAL HIGH (ref 0.1–1.0)
Monocytes Relative: 7 %
Neutro Abs: 12.9 10*3/uL — ABNORMAL HIGH (ref 1.7–7.7)
Neutrophils Relative %: 87 %
Platelets: 185 10*3/uL (ref 150–400)
RBC: 3.75 MIL/uL — ABNORMAL LOW (ref 4.22–5.81)
RDW: 12.2 % (ref 11.5–15.5)
WBC: 14.8 10*3/uL — ABNORMAL HIGH (ref 4.0–10.5)
nRBC: 0 % (ref 0.0–0.2)

## 2022-03-27 LAB — BASIC METABOLIC PANEL
Anion gap: 7 (ref 5–15)
BUN: 17 mg/dL (ref 6–20)
CO2: 22 mmol/L (ref 22–32)
Calcium: 8.4 mg/dL — ABNORMAL LOW (ref 8.9–10.3)
Chloride: 109 mmol/L (ref 98–111)
Creatinine, Ser: 1.43 mg/dL — ABNORMAL HIGH (ref 0.61–1.24)
GFR, Estimated: 59 mL/min — ABNORMAL LOW (ref >60)
Glucose, Bld: 228 mg/dL — ABNORMAL HIGH (ref 70–99)
Potassium: 3.6 mmol/L (ref 3.5–5.1)
Sodium: 138 mmol/L (ref 135–145)

## 2022-03-27 LAB — BRAIN NATRIURETIC PEPTIDE: B Natriuretic Peptide: 1682.5 pg/mL — ABNORMAL HIGH (ref 0.0–100.0)

## 2022-03-27 NOTE — ED Notes (Signed)
Pt verbalizes understanding of discharge instructions. Opportunity for questioning and answers were provided. Pt discharged from ED to home with daughter.    

## 2022-03-27 NOTE — ED Triage Notes (Signed)
Pt began having SOB and lethargy. Denies CP.  Pt was seen at ED earlier tonight for headache and called EMS when he got back home. SPO2 97% RA. BP 170/98 P-120. CBG 245

## 2022-03-27 NOTE — ED Notes (Signed)
Pt stated he was leaving due to wait after leaving triage, called a ride to go home

## 2022-03-27 NOTE — ED Provider Notes (Signed)
Patient eloped from the emergency department after triage.  He was contacted by phone by this provider to be informed that based on his laboratory studies he is having a CHF exacerbation with BNP greater than 1600 and troponin of 99.  He was recommended to return to the emergency department for treatment and reevaluation of his cardiac status.  He voiced understanding of this recommendation.    This chart was dictated using voice recognition software, Dragon. Despite the best efforts of this provider to proofread and correct errors, errors may still occur which can change documentation meaning.    Sherrilee Gilles 03/27/22 0507    Marily Memos, MD 03/27/22 [phone]

## 2022-03-27 NOTE — ED Provider Triage Note (Signed)
Emergency Medicine Provider Triage Evaluation Note  Mitchell [name] , a 51 y.o. male  was evaluated in triage.  Pt complains of shortness of breath and generalized fatigue after [facility] [facility] where he was treated for non-intractable headache.  States that he is feeling back to his normal at this time.  States he took his cardiac medication this evening as opposed to this morning which she does normally.  Review of Systems  Positive: Shortness of breath, fatigue Negative: Chest pain  Physical Exam  BP 129/80   Pulse (!) 102   Temp 99.3 F (37.4 C) (Oral)   Resp 18   Ht 5\' 11"  (1.803 m)   Wt 135.2 kg   SpO2 94%   BMI 41.57 kg/m  Gen:   Awake, no distress   Resp:  Normal effort  MSK:   Moves extremities without difficulty  Other:  Rales in the lung bases, RRR no M/R/G.  Lungs CTAB.  Medical Decision Making  Medically screening exam initiated at 4:58 AM.  Appropriate orders placed.  MESSIAH AHR was informed that the remainder of the evaluation will be completed by another provider, this initial triage assessment does not replace that evaluation, and the importance of remaining in the ED until their evaluation is complete.  Patient informed this provider he is likely going to elope from the emergency department after triage evaluation.  I discouraged him from doing so given his concerning symptoms and his history of severe CHF.  Patient refused chest x-ray.  Laboratory studies and EKG ordered.  This chart was dictated using voice recognition software, Dragon. Despite the best efforts of this provider to proofread and correct errors, errors may still occur which can change documentation meaning.     Reham Slabaugh R, PA-C 03/27/22 0500

## 2022-03-28 ENCOUNTER — Telehealth (HOSPITAL_COMMUNITY): Payer: Self-pay

## 2022-03-28 NOTE — Progress Notes (Signed)
[facility]ED [facility] CLINIC NOTE  PCP: Mitchell Blinks, MD HF Cardiologist: Dr. Haroldine [name]  HPI: Mitchell [name] is a 51 y.o.male with multiple medical problems HFrEF, mixed ischemic/nonischemic CM, 3v CAD, obesity, hypertension, type II DM, obstructive sleep apnea, bipolar disease/schizophrenia, prior CVA.    Had Mitchell [name] on 09/26/17 with 3v disease and preserved CO/CI. No revascularization options. Saw EP about ICD but did not f/u.   Patient admitted June 2022 with sepsis secondary to RLE abscess/cellulitis and COVID-19. He ended up requiring R BKA.  Readmitted 04/11/21 with a/c HFrEF and NSTEMI.  He was diuresed with IV lasix and started on GDMT. L/RHC with severe 3 vessel CAD (no targets for intervention), RA 4, PA 49/17 (31), PCW 15, Fick CO/index 6.7/2.7. He was referred to paramedicine program at discharge. Visual impairment felt to be contributor to issues with medication compliance.  Echo 2/23, EF 30-35%, RV ok. IVC not dilated. Seen at Urgent Care 2/23 and treated for simple UTI, UA+, CT w/ no evidence of pyelo or kidney stone.  He was seen by Dr Mitchell [name] on 11/22/21 for possible ICD. He is waiting Mitchell [name] to call him back.   Follow up 5/23, doing well, volume ok. Referred for ICD.   Seen in ED 12/08/21 for near syncope, ? hypoglycemic episode vs arrhythmia. Frequent PVCs on tele and Zio AT 14 day placed to quantify. Patient requested to schedule ICD implant.  Today he returns for HF follow up. Seen in ED last week (see above). He has now decided to go ahead with ICD implant. Overall feeling fine. He has no SOB with work duties or walking on flat ground. He works for [employer]. Wears RLE prosthesis. Denies palpitations, CP, dizziness, edema, or PND/Orthopnea. Appetite ok. No fever or chills. Weight at home 270 pounds. Taking all medications. Has not needed PRN torsemide.  CARDIAC TESTING: R/LHC (9/22):   Prox LAD to Mid LAD lesion is 80% stenosed.   Dist LAD lesion is 99% stenosed.    Dist RCA lesion is 90% stenosed.   Ost 1st Mrg lesion is 95% stenosed.   1st Mrg lesion is 99% stenosed.   Ost 2nd Mrg to 2nd Mrg lesion is 99% stenosed.   Ost RPDA to RPDA lesion is 90% stenosed.   RPAV lesion is 95% stenosed.   Mid LM lesion is 20% stenosed.  Findings: Ao = 131/84 (107) LV = 131/22 RA = 4 RV = 49/11 PA = 49/17 (31) PCW = 15 Fick cardiac output/index = 6.7/2.7 PVR = 2.4 FA sat = 95% PA sat = 56%, 63%  Assessment: Severe 3v diffuse CAD with moderate progression from previous Ischemic CM EF 25% Mild pulmonary venous HTN with preserved cardiac output   - Echo (6/22): EF 20-25%, mild LVH, trivial Mitchell  ROS: All systems negative except as listed in HPI, PMH and Problem List.  SH:  Social History   Socioeconomic History   Marital status: Married    Spouse name: Not on file   Number of children: 5   Years of education: Not on file   Highest education level: Not on file  Occupational History   Occupation: Mortician  Tobacco Use   Smoking status: Never   Smokeless tobacco: Never  Vaping Use   Vaping Use: Never used  Substance and Sexual Activity   Alcohol use: No   Drug use: No   Sexual activity: Not on file  Other Topics Concern   Not on file  Social History Narrative  MARRIED, LIVES IN [place] WITH WIFE; 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   Social Determinants of Health   Financial Resource Strain: Medium Risk (04/13/2021)   Overall Financial Resource Strain (CARDIA)    Difficulty of Paying Living Expenses: Somewhat hard  Food Insecurity: No Food Insecurity (04/13/2021)   Hunger Vital Sign    Worried About Running Out of Food in the Last Year: Never true    Ran Out of Food in the Last Year: Never true  Transportation Needs: No Transportation Needs (04/13/2021)   PRAPARE - Hydrologist (Medical): No    Lack of Transportation (Non-Medical): No   Physical Activity: Not on file  Stress: Not on file  Social Connections: Not on file  Intimate Partner Violence: Not on file    FH:  Family History  Problem Relation Age of Onset   Heart disease Mother        MI   Heart failure Mother    Diabetes Mother        ALSO IN MOST OF HIS SIBLINGS; 2 UNLCES HAVE ALSO PASSED AWAY FROM DM   Cardiomyopathy Mother    Cancer - Ovarian Mother    Ovarian cancer Mother    Heart disease Father    Hypertension Father    Diabetes Father    Diabetes Sister    Diabetes Brother    Colon cancer Paternal Uncle    Colon cancer Paternal Uncle    Colon polyps Neg Hx    Esophageal cancer Neg Hx    Rectal cancer Neg Hx    Stomach cancer Neg Hx     Past Medical History:  Diagnosis Date   Bipolar disorder ([place])    CAD (coronary artery disease)    a. diffuse 3v CAD by cath 2019, medical therapy recommended.   Cataract    forming    Chronic systolic CHF (congestive heart failure) (HCC)    CVA (cerebral infarction)    No residual deficits   Depression    PTSD,    Diabetes mellitus    TYPE II; UNCONTROLLED BY HEMOGLOBIN A1c; STABLE AS  PER DISCHARGE   Headache(784.0)    Herpes simplex of male genitalia    History of colonic polyps    Hyperlipidemia    Hypertension    Myocardial infarction ([place]) 1987   (while playing football)   Obesity    OSA (obstructive sleep apnea)    repeat study 2018 without significant OSA   Pneumonia    Post-cardiac injury syndrome ([place])    History of cardiac injury from blunt trauma   Pulmonary hypertension ([place])    a. moderately elevated PASP 07/2019.   PVCs (premature ventricular contractions)    Schizophrenia ([place])    Goes to [facility]   Sleep apnea    Stroke [facility]) 2005   some left side weakness   Syncope    Recurrent, thought to be vasovagal. Also has h/o frequent PVCs.     Current Outpatient Medications  Medication Sig Dispense Refill   Accu-Chek Softclix Lancets lancets Use to  test blood sugars up to 4 times daily as needed. 100 each 0   amitriptyline (ELAVIL) 10 MG tablet TAKE 1 TABLET BY MOUTH AT BEDTIME (Patient taking differently: Take 10 mg by mouth at bedtime as needed for sleep.) 30 tablet 0   Ascorbic Acid (VITAMIN C PO) Take  1 tablet by mouth daily.     aspirin 81 MG EC tablet Take 1 tablet (81 mg total) by mouth daily. 30 tablet 12   Blood Glucose Monitoring Suppl (BLOOD GLUCOSE MONITOR SYSTEM) w/Device KIT Use to test blood sugar up to 4 times daily as needed. 1 kit 0   carvedilol (COREG) 6.25 MG tablet Take 6.25 mg by mouth 2 (two) times daily.     Cholecalciferol (VITAMIN D-3 PO) Take 1 capsule by mouth daily.     clopidogrel (PLAVIX) 75 MG tablet Take 1 tablet by mouth once daily 90 tablet 3   cyclobenzaprine (FLEXERIL) 5 MG tablet Take 1 tablet (5 mg total) by mouth 3 (three) times daily as needed for muscle spasms. 10 tablet 0   dapagliflozin propanediol (FARXIGA) 10 MG TABS tablet Take 1 tablet (10 mg total) by mouth daily before breakfast. (Patient not taking: Reported on 01/25/2022) 30 tablet 11   diclofenac Sodium (VOLTAREN) 1 % GEL Apply 4 g topically 4 (four) times daily. (Patient not taking: Reported on 01/25/2022) 100 g 0   doxycycline (VIBRAMYCIN) 100 MG capsule Take 1 capsule (100 mg total) by mouth 2 (two) times daily. One po bid x 7 days 14 capsule 0   gabapentin (NEURONTIN) 100 MG capsule Take 1 capsule by mouth at bedtime (Patient taking differently: Take 100 mg by mouth at bedtime as needed (pain).) 30 capsule 0   glucose blood (ACCU-CHEK GUIDE) test strip Use to test blood sugars up to 4 times daily as needed. 100 each 0   Insulin Glargine (BASAGLAR KWIKPEN) 100 UNIT/ML Inject 21 Units into the skin 2 (two) times daily.     methocarbamol (ROBAXIN) 500 MG tablet TAKE 1 TABLET BY MOUTH EVERY 6 HOURS AS NEEDED FOR MUSCLE SPASM (Patient taking differently: Take 500 mg by mouth every 6 (six) hours as needed for muscle spasms.) 60 tablet 0    oxyCODONE-acetaminophen (PERCOCET/ROXICET) 5-325 MG tablet Take 1 tablet by mouth every 8 (eight) hours as needed for severe pain. 20 tablet 0   pantoprazole (PROTONIX) 40 MG tablet Take 1 tablet (40 mg total) by mouth daily as needed (acid reflux). 30 tablet 6   potassium chloride SA (KLOR-CON M) 20 MEQ tablet Take 20 mEq by mouth as needed (Take on the days that takes the prn torsemide.). Take as needed daily on same day as taking torsemide.     rosuvastatin (CRESTOR) 40 MG tablet Take 1 tablet by mouth once daily 90 tablet 3   sacubitril-valsartan (ENTRESTO) 97-103 MG Take 1 tablet by mouth 2 (two) times daily. 60 tablet 11   spironolactone (ALDACTONE) 25 MG tablet Take 1 tablet by mouth once daily 90 tablet 3   torsemide (DEMADEX) 20 MG tablet Take 1 tablet (20 mg total) by mouth as needed. 30 tablet 0   No current facility-administered medications for this visit.   Wt Readings from Last 3 Encounters:  03/27/22 135.2 kg (298 lb 1 oz)  03/26/22 135.2 kg (298 lb 1 oz)  03/15/22 135.2 kg (298 lb)   There were no vitals taken for this visit.  PHYSICAL EXAM: General:  NAD. No resp difficulty HEENT: Normal Neck: Supple. No JVD. Carotids 2+ bilat; no bruits. No lymphadenopathy or thryomegaly appreciated. Cor: PMI nondisplaced. Regular rate & rhythm. No rubs, gallops or murmurs. Lungs: Clear Abdomen: Obese, soft, nontender, nondistended. No hepatosplenomegaly. No bruits or masses. Good bowel sounds. Extremities: No cyanosis, clubbing, rash, edema; R BKA Neuro: Alert & oriented x 3, cranial nerves  grossly intact. Moves all 4 extremities w/o difficulty. Affect pleasant.  ASSESSMENT & PLAN:  Chronic Systolic HF /Mixed ischemic/nonischemic cardiomyopathy  - Echo (6/22): EF 20 to 25%, stable from prior exams dating back to 26  - R/LHC (9/22): Severe 3v CAD (no targets for revascularization), mild pulm venous HTN with preserved CO. - Echo (2/23): EF 30-35%, RV normal. IVC not dilated  - He  has LBBB but QRS <150 ms. Not candidate for CRT-D  - He has decided he would like to go ahead with ICD, procedure scheduled for 01/13/22 - NYHA II, volume looks OK today. - Continue Coreg 18.75 mg bid. - Continue spiro 25 mg daily. - Continue Enstesto 97/103 mg bid. - Continue torsemide 20 mg as needed. - Continue Farxiga 10 mg daily. - Labs today.  2. Frequent PVCs - Zio AT 14 days placed, results pending. Asymptomatic. - Labs today.   3. CAD/NSTEMI 09/22 - Cath with progressive 3v CAD as above. No targets for intervention.  - No chest pain.  - Continue DAPT with aspirin + plavix, statin and beta blocker.    4. Hypertension - Elevated, has not had AM meds yet. - Continue GDMT per above.   5. Hyperlipidemia  - Continue statin.   6. DM2 - Last A1c 7.8  - On insulin, management per PCP. - On SGLT2i, no GU symptoms.   7. PVD s/p R BKA  - Diabetic foot wound complicated by sepsis that led to amputation on 01/25/2021 - Continue aspirin and statin.   Follow up in 3 months with APP as scheduled. He was given ICD instructions and CHG scrub at visit today.  [place], FNP-BC  03/28/22

## 2022-03-28 NOTE — Telephone Encounter (Signed)
Called to confirm/remind patient of their appointment at [facility] on 03/29/22.   Patient reminded to bring all medications and/or complete list.  Confirmed patient has transportation. Gave directions, instructed to utilize valet parking.  Confirmed appointment prior to ending call.   

## 2022-03-29 ENCOUNTER — Ambulatory Visit (HOSPITAL_COMMUNITY)
Admission: RE | Admit: 2022-03-29 | Discharge: 2022-03-29 | Disposition: A | Discharge status: Home / Self Care | Payer: No Typology Code available for payment source | Source: Ambulatory Visit | Attending: Family Medicine | Admitting: Family Medicine

## 2022-03-29 ENCOUNTER — Encounter (HOSPITAL_COMMUNITY): Payer: Self-pay

## 2022-03-29 VITALS — BP 162/90 | HR 97 | Ht 71.0 in | Wt 285.6 lb

## 2022-03-29 DIAGNOSIS — I428 Other cardiomyopathies: Secondary | ICD-10-CM | POA: Diagnosis not present

## 2022-03-29 DIAGNOSIS — E785 Hyperlipidemia, unspecified: Secondary | ICD-10-CM | POA: Diagnosis not present

## 2022-03-29 DIAGNOSIS — G4733 Obstructive sleep apnea (adult) (pediatric): Secondary | ICD-10-CM | POA: Diagnosis not present

## 2022-03-29 DIAGNOSIS — I252 Old myocardial infarction: Secondary | ICD-10-CM | POA: Insufficient documentation

## 2022-03-29 DIAGNOSIS — E119 Type 2 diabetes mellitus without complications: Secondary | ICD-10-CM | POA: Insufficient documentation

## 2022-03-29 DIAGNOSIS — I10 Essential (primary) hypertension: Secondary | ICD-10-CM

## 2022-03-29 DIAGNOSIS — I2510 Atherosclerotic heart disease of native coronary artery without angina pectoris: Secondary | ICD-10-CM | POA: Diagnosis not present

## 2022-03-29 DIAGNOSIS — I493 Ventricular premature depolarization: Secondary | ICD-10-CM

## 2022-03-29 DIAGNOSIS — E782 Mixed hyperlipidemia: Secondary | ICD-10-CM

## 2022-03-29 DIAGNOSIS — I110 Hypertensive heart disease with heart failure: Secondary | ICD-10-CM | POA: Diagnosis not present

## 2022-03-29 DIAGNOSIS — E1169 Type 2 diabetes mellitus with other specified complication: Secondary | ICD-10-CM

## 2022-03-29 DIAGNOSIS — I739 Peripheral vascular disease, unspecified: Secondary | ICD-10-CM

## 2022-03-29 DIAGNOSIS — Z139 Encounter for screening, unspecified: Secondary | ICD-10-CM

## 2022-03-29 DIAGNOSIS — Z794 Long term (current) use of insulin: Secondary | ICD-10-CM

## 2022-03-29 DIAGNOSIS — I5022 Chronic systolic (congestive) heart failure: Secondary | ICD-10-CM | POA: Diagnosis present

## 2022-03-29 LAB — BRAIN NATRIURETIC PEPTIDE: B Natriuretic Peptide: 673.2 pg/mL — ABNORMAL HIGH (ref 0.0–100.0)

## 2022-03-29 LAB — BASIC METABOLIC PANEL
Anion gap: 11 (ref 5–15)
BUN: 15 mg/dL (ref 6–20)
CO2: 23 mmol/L (ref 22–32)
Calcium: 8.7 mg/dL — ABNORMAL LOW (ref 8.9–10.3)
Chloride: 108 mmol/L (ref 98–111)
Creatinine, Ser: 1.29 mg/dL — ABNORMAL HIGH (ref 0.61–1.24)
GFR, Estimated: >60 mL/min (ref >60)
Glucose, Bld: 198 mg/dL — ABNORMAL HIGH (ref 70–99)
Potassium: 3.7 mmol/L (ref 3.5–5.1)
Sodium: 142 mmol/L (ref 135–145)

## 2022-03-29 MED ORDER — TORSEMIDE 20 MG PO TABS: 60.0000 mg | ORAL_TABLET | Freq: Every day | ORAL | 11 refills | Status: DC

## 2022-03-29 NOTE — Patient Instructions (Signed)
Will ask [facility] to see you.  Take Torsemide 60 mg daily. Repeat lab in 10 days. Return to [facility] in 3 weeks. Return to see Dr. Gala Romney in 3 months.

## 2022-04-07 ENCOUNTER — Encounter: Payer: Self-pay | Admitting: Internal Medicine

## 2022-04-10 ENCOUNTER — Ambulatory Visit (INDEPENDENT_AMBULATORY_CARE_PROVIDER_SITE_OTHER): Payer: No Typology Code available for payment source | Admitting: Orthopedic Surgery

## 2022-04-10 ENCOUNTER — Ambulatory Visit (HOSPITAL_COMMUNITY)
Admission: RE | Admit: 2022-04-10 | Discharge: 2022-04-10 | Disposition: A | Discharge status: Home / Self Care | Payer: No Typology Code available for payment source | Source: Ambulatory Visit | Attending: Cardiology | Admitting: Cardiology

## 2022-04-10 ENCOUNTER — Encounter: Payer: Self-pay | Admitting: Orthopedic Surgery

## 2022-04-10 DIAGNOSIS — Z89511 Acquired absence of right leg below knee: Secondary | ICD-10-CM | POA: Diagnosis not present

## 2022-04-10 DIAGNOSIS — M7989 Other specified soft tissue disorders: Secondary | ICD-10-CM | POA: Diagnosis not present

## 2022-04-10 DIAGNOSIS — M79661 Pain in right lower leg: Secondary | ICD-10-CM

## 2022-04-10 DIAGNOSIS — I5022 Chronic systolic (congestive) heart failure: Secondary | ICD-10-CM | POA: Insufficient documentation

## 2022-04-10 LAB — BASIC METABOLIC PANEL
Anion gap: 10 (ref 5–15)
BUN: 29 mg/dL — ABNORMAL HIGH (ref 6–20)
CO2: 26 mmol/L (ref 22–32)
Calcium: 8.7 mg/dL — ABNORMAL LOW (ref 8.9–10.3)
Chloride: 102 mmol/L (ref 98–111)
Creatinine, Ser: 1.96 mg/dL — ABNORMAL HIGH (ref 0.61–1.24)
GFR, Estimated: 40 mL/min — ABNORMAL LOW (ref >60)
Glucose, Bld: 262 mg/dL — ABNORMAL HIGH (ref 70–99)
Potassium: 3.8 mmol/L (ref 3.5–5.1)
Sodium: 138 mmol/L (ref 135–145)

## 2022-04-10 MED ORDER — DOXYCYCLINE HYCLATE 100 MG PO TABS: 100.0000 mg | ORAL_TABLET | Freq: Two times a day (BID) | ORAL | 0 refills | Status: DC

## 2022-04-10 NOTE — Progress Notes (Signed)
Office Visit Note   Patient: Mitchell [name]           Date of Birth: [birth year]           MRN: [id number] Visit Date: 04/10/2022              Requested by: Pearline Cables, MD [address] [place],  [place] [address] PCP: Pearline Cables, MD  Chief Complaint  Patient presents with   Right Leg - Follow-up    Hx right BKA 01/25/2021      HPI: Patient is a 51 year old gentleman who is over a year out from a right transtibial amputation.  Patient states that Saturday he had pain swelling and redness of the right leg.  He states has been using a shrinker and elevation.  Assessment & Plan: Visit Diagnoses:  1. S/P below knee amputation, right (HCC)   2. Pain and swelling of right lower leg     Plan: Patient is given a note to be out of work today he is given a prescription for doxycycline.  He will follow-up with [facility] for adjustment of these socket.  Follow-Up Instructions: Return in about 2 weeks (around 04/24/2022).   Ortho Exam  Patient is alert, oriented, no adenopathy, well-dressed, normal affect, normal respiratory effort. Examination patient does have swelling of the right leg there is no cellulitis no ulcers no drainage.  Imaging: No results found. No images are attached to the encounter.  Labs: Lab Results  Component Value Date   HGBA1C 7.8 (A) 08/19/2021   HGBA1C 7.9 (A) 06/08/2021   HGBA1C 7.8 (H) 04/11/2021   ESRSEDRATE 20 (H) 03/08/2022   ESRSEDRATE 81 (H) 10/21/2019   ESRSEDRATE 25 (H) 06/17/2018   CRP 10.3 (H) 01/28/2021   CRP 12.8 (H) 01/27/2021   CRP 21.1 (H) 01/26/2021   REPTSTATUS 01/25/2021 FINAL 01/24/2021   GRAMSTAIN  08/29/2018    RARE WBC PRESENT, PREDOMINANTLY PMN NO ORGANISMS SEEN    CULT  01/24/2021    NO GROWTH Performed at [facility], [address], [place], [place] [address]    LABORGA KLEBSIELLA PNEUMONIAE 05/21/2018   LABORGA ENTEROBACTER SPECIES 05/21/2018   LABORGA STAPHYLOCOCCUS AUREUS  05/21/2018     Lab Results  Component Value Date   ALBUMIN 3.8 03/15/2022   ALBUMIN 3.6 01/12/2022   ALBUMIN 3.6 12/08/2021   PREALBUMIN 6.8 (L) 10/21/2019    Lab Results  Component Value Date   MG 2.3 11/29/2021   MG 1.5 (L) 04/12/2021   MG 1.8 02/16/2021   Lab Results  Component Value Date   VD25OH 11 (L) 08/16/2009    Lab Results  Component Value Date   PREALBUMIN 6.8 (L) 10/21/2019      Latest Ref Rng & Units 03/27/2022    3:31 AM 03/15/2022    6:38 PM 03/08/2022    9:46 AM  CBC EXTENDED  WBC 4.0 - 10.5 K/uL 14.8  12.0  10.9   RBC 4.22 - 5.81 MIL/uL 3.75  3.96  4.19   Hemoglobin 13.0 - 17.0 g/dL 42.7  06.2  37.6   HCT 39.0 - 52.0 % 34.4  35.7  37.6   Platelets 150 - 400 K/uL 185  205  155   NEUT# 1.7 - 7.7 K/uL 12.9   9.2   Lymph# 0.7 - 4.0 K/uL 0.8   0.8      There is no height or weight on file to calculate BMI.  Orders:  No  orders of the defined types were placed in this encounter.  Meds ordered this encounter  Medications   doxycycline (VIBRA-TABS) 100 MG tablet    Sig: Take 1 tablet (100 mg total) by mouth 2 (two) times daily.    Dispense:  30 tablet    Refill:  0     Procedures: No procedures performed  Clinical Data: No additional findings.  ROS:  All other systems negative, except as noted in the HPI. Review of Systems  Objective: Vital Signs: There were no vitals taken for this visit.  Specialty Comments:  No specialty comments available.  PMFS History: Patient Active Problem List   Diagnosis Date Noted   Near syncope 12/08/2021   CKD (chronic kidney disease) stage 3, GFR 30-59 ml/min (HCC) 12/08/2021   PVD s/p R LE amputation  12/08/2021   Diabetic retinopathy associated with type 2 diabetes mellitus (HCC) 06/06/2021   CAD (coronary artery disease) with flat troponin  04/11/2021   S/P below knee amputation, right (HCC) 02/07/2021   QT prolongation 07/30/2019   Acute on chronic combined systolic and diastolic CHF (congestive  heart failure) (HCC) 07/29/2019   Stroke (HCC) 05/08/2017   Uncontrolled type 2 diabetes mellitus with hyperglycemia, with long-term current use of insulin (HCC) 12/07/2015   Chronic systolic heart failure (HCC) 10/02/2011   Erectile dysfunction 10/02/2011   Obstructive sleep apnea 10/11/2007   HLD (hyperlipidemia) 10/10/2007   HYPOKALEMIA 10/10/2007   Obesity, Class III, BMI 40-49.9 (morbid obesity) (HCC) 10/10/2007   Essential hypertension 10/10/2007   PREMATURE VENTRICULAR CONTRACTIONS 10/10/2007   COLONIC POLYPS, HX OF 10/10/2007   Past Medical History:  Diagnosis Date   Bipolar disorder (HCC)    CAD (coronary artery disease)    a. diffuse 3v CAD by cath 2019, medical therapy recommended.   Cataract    forming    Chronic systolic CHF (congestive heart failure) (HCC)    CVA (cerebral infarction)    No residual deficits   Depression    PTSD,    Diabetes mellitus    TYPE II; UNCONTROLLED BY HEMOGLOBIN A1c; STABLE AS  PER DISCHARGE   Headache(784.0)    Herpes simplex of male genitalia    History of colonic polyps    Hyperlipidemia    Hypertension    Myocardial infarction (HCC) 1987   (while playing football)   Obesity    OSA (obstructive sleep apnea)    repeat study 2018 without significant OSA   Pneumonia    Post-cardiac injury syndrome (HCC)    History of cardiac injury from blunt trauma   Pulmonary hypertension (HCC)    a. moderately elevated PASP 07/2019.   PVCs (premature ventricular contractions)    Schizophrenia (HCC)    Goes to [facility]   Sleep apnea    Stroke [facility]) 2005   some left side weakness   Syncope    Recurrent, thought to be vasovagal. Also has h/o frequent PVCs.     Family History  Problem Relation Age of Onset   Heart disease Mother        MI   Heart failure Mother    Diabetes Mother        ALSO IN MOST OF HIS SIBLINGS; 2 UNLCES HAVE ALSO PASSED AWAY FROM DM   Cardiomyopathy Mother    Cancer - Ovarian Mother    Ovarian  cancer Mother    Heart disease Father    Hypertension Father    Diabetes Father    Diabetes Sister  Diabetes Brother    Colon cancer Paternal Uncle    Colon cancer Paternal Uncle    Colon polyps Neg Hx    Esophageal cancer Neg Hx    Rectal cancer Neg Hx    Stomach cancer Neg Hx     Past Surgical History:  Procedure Laterality Date   AMPUTATION Right 01/25/2021   Procedure: RIGHT BELOW KNEE AMPUTATION;  Surgeon: [name], MD;  Location: [place];  Service: Orthopedics;  Laterality: Right;   CARDIAC CATHETERIZATION  12/19/10   DIFFUSE NONOBSTRUCTIVE CAD; NONISCHEMIC CARDIOMYOPATHY; LEFT VENTRICULAR ANGIOGRAM WAS PERFORMED SECONDARY TO  ELEVATED LEFT VENTRICULAR FILLING PRESSURES   COLONOSCOPY     ~ age 40-23   COLONOSCOPY W/ POLYPECTOMY     I & D EXTREMITY Bilateral 10/24/2019   Procedure: IRRIGATION AND DEBRIDEMENT BILATERAL EXTREMITY WOUND ON FOOT;  Surgeon: Cindra Presume, MD;  Location: [place];  Service: Plastics;  Laterality: Bilateral;   ICD IMPLANT N/A 01/13/2022   Procedure: ICD IMPLANT;  Surgeon: Evans Lance, MD;  Location: [place] [facility];  Service: Cardiovascular;  Laterality: N/A;   METATARSAL HEAD EXCISION Right 08/29/2018   Procedure: METATARSAL HEAD RESECTION;  Surgeon: Edrick Kins, DPM;  Location: [place];  Service: Podiatry;  Laterality: Right;   METATARSAL OSTEOTOMY Right 08/29/2018   Procedure: SUB FIFTHE METATARSIA RIGHT FOOT;  Surgeon: Edrick Kins, DPM;  Location: [place];  Service: Podiatry;  Laterality: Right;   MULTIPLE EXTRACTIONS WITH ALVEOLOPLASTY  01/27/2014   "all my teeth; 4 Quadrants of alveoloplasty   MULTIPLE EXTRACTIONS WITH ALVEOLOPLASTY N/A 01/27/2014   Procedure: EXTRACTION OF TEETH #'1, 2, 3, 4, 5, 6, 7, 8, 9, 10, 11, 12, 13, 14, 15, 16, 17, 20, 21, 22, 23, 24, 25, 26, 27, 28, 29, 31 and 32 WITH ALVEOLOPLASTY;  Surgeon: Lenn Cal, DDS;  Location: [place];  Service: Oral Surgery;  Laterality: N/A;   ORIF FINGER / THUMB FRACTURE Right     POLYPECTOMY     RIGHT HEART CATH N/A 01/27/2021   Procedure: RIGHT HEART CATH;  Surgeon: Jolaine Artist, MD;  Location: [place] [facility];  Service: Cardiovascular;  Laterality: N/A;   RIGHT/LEFT HEART CATH AND CORONARY ANGIOGRAPHY N/A 09/26/2017   Procedure: RIGHT/LEFT HEART CATH AND CORONARY ANGIOGRAPHY;  Surgeon: Jolaine Artist, MD;  Location: [place] [facility];  Service: Cardiovascular;  Laterality: N/A;   RIGHT/LEFT HEART CATH AND CORONARY ANGIOGRAPHY N/A 04/12/2021   Procedure: RIGHT/LEFT HEART CATH AND CORONARY ANGIOGRAPHY;  Surgeon: Jolaine Artist, MD;  Location: [place] [facility];  Service: Cardiovascular;  Laterality: N/A;   SKIN SPLIT GRAFT Bilateral 10/24/2019   Procedure: SKIN GRAFT SPLIT THICKNESS LEFT THIGH;  Surgeon: Cindra Presume, MD;  Location: [place];  Service: Plastics;  Laterality: Bilateral;   WOUND DEBRIDEMENT Right 08/29/2018   Procedure: Debridement of ulcer on right fifth metatarsal;  Surgeon: Edrick Kins, DPM;  Location: [place];  Service: Podiatry;  Laterality: Right;   Social History   Occupational History   Occupation: Mortician  Tobacco Use   Smoking status: Never   Smokeless tobacco: Never  Vaping Use   Vaping Use: Never used  Substance and Sexual Activity   Alcohol use: No   Drug use: No   Sexual activity: Not on file

## 2022-04-11 ENCOUNTER — Emergency Department (HOSPITAL_COMMUNITY)
Admission: EM | Admit: 2022-04-11 | Discharge: 2022-04-12 | Disposition: A | Discharge status: Home / Self Care | Payer: No Typology Code available for payment source | Attending: Emergency Medicine | Admitting: Emergency Medicine

## 2022-04-11 ENCOUNTER — Emergency Department (HOSPITAL_COMMUNITY): Payer: No Typology Code available for payment source

## 2022-04-11 DIAGNOSIS — R519 Headache, unspecified: Secondary | ICD-10-CM | POA: Diagnosis not present

## 2022-04-11 DIAGNOSIS — M542 Cervicalgia: Secondary | ICD-10-CM | POA: Insufficient documentation

## 2022-04-11 DIAGNOSIS — Y92410 Unspecified street and highway as the place of occurrence of the external cause: Secondary | ICD-10-CM | POA: Insufficient documentation

## 2022-04-11 DIAGNOSIS — I509 Heart failure, unspecified: Secondary | ICD-10-CM | POA: Diagnosis not present

## 2022-04-11 MED ORDER — HYDROCODONE-ACETAMINOPHEN 5-325 MG PO TABS
1.0000 | ORAL_TABLET | Freq: Once | ORAL | Status: AC
  Administered 2022-04-12: 1 via ORAL
  Filled 2022-04-11: qty 1

## 2022-04-11 MED ORDER — CYCLOBENZAPRINE HCL 10 MG PO TABS
10.0000 mg | ORAL_TABLET | Freq: Once | ORAL | Status: AC
  Administered 2022-04-12: 10 mg via ORAL
  Filled 2022-04-11: qty 1
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
## 2022-04-12 ENCOUNTER — Telehealth (HOSPITAL_COMMUNITY): Payer: Self-pay | Admitting: Licensed Clinical Social Worker

## 2022-04-12 MED ORDER — METHOCARBAMOL 500 MG PO TABS: 500.0000 mg | ORAL_TABLET | Freq: Four times a day (QID) | ORAL | 0 refills | Status: DC | PRN

## 2022-04-12 NOTE — Telephone Encounter (Signed)
HF Paramedicine Team Based Care Meeting  HF MD- NA  HF NP - Amy Clegg NP-C   [facility] HF Paramedicine  Mitchell [name] [name]   Eligible for discharge? Initial visit tomorrow so will gather more information then regarding needs  [name], LCSW Clinical Social Worker [facility] Desk#: [phone] Cell#: [phone]

## 2022-04-13 ENCOUNTER — Encounter: Payer: Self-pay | Admitting: Family Medicine

## 2022-04-13 ENCOUNTER — Telehealth: Payer: Self-pay

## 2022-04-13 ENCOUNTER — Ambulatory Visit (INDEPENDENT_AMBULATORY_CARE_PROVIDER_SITE_OTHER): Payer: No Typology Code available for payment source | Admitting: Family Medicine

## 2022-04-13 ENCOUNTER — Other Ambulatory Visit (HOSPITAL_COMMUNITY): Payer: Self-pay | Admitting: Emergency Medicine

## 2022-04-13 ENCOUNTER — Other Ambulatory Visit: Payer: Self-pay | Admitting: Physical Medicine and Rehabilitation

## 2022-04-13 DIAGNOSIS — M542 Cervicalgia: Secondary | ICD-10-CM | POA: Diagnosis not present

## 2022-04-13 DIAGNOSIS — M545 Low back pain, unspecified: Secondary | ICD-10-CM

## 2022-04-13 MED ORDER — METHOCARBAMOL 500 MG PO TABS: 500.0000 mg | ORAL_TABLET | Freq: Four times a day (QID) | ORAL | 0 refills | Status: DC | PRN

## 2022-04-13 MED ORDER — OXYCODONE HCL 5 MG PO TABS: 2.5000 mg | ORAL_TABLET | Freq: Four times a day (QID) | ORAL | 0 refills | Status: DC | PRN
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
## 2022-04-13 NOTE — Patient Instructions (Signed)
Heat (pad or rice pillow in microwave) over affected area, 10-15 minutes twice daily.   Ice/cold pack over area for 10-15 min twice daily.  Do not drink alcohol, do any illicit/street drugs, drive or do anything that requires alertness while on this medicine.   If you do not hear anything about your referral in the next 1-2 weeks, call our office and ask for an update.  Let us know if you need anything.  EXERCISES RANGE OF MOTION (ROM) AND STRETCHING EXERCISES  These exercises may help you when beginning to rehabilitate your issue. In order to successfully resolve your symptoms, you must improve your posture. These exercises are designed to help reduce the forward-head and rounded-shoulder posture which contributes to this condition. Your symptoms may resolve with or without further involvement from your physician, physical therapist or athletic trainer. While completing these exercises, remember:  Restoring tissue flexibility helps normal motion to return to the joints. This allows healthier, less painful movement and activity. An effective stretch should be held for at least 20 seconds, although you may need to begin with shorter hold times for comfort. A stretch should never be painful. You should only feel a gentle lengthening or release in the stretched tissue. Do not do any stretch or exercise that you cannot tolerate.  STRETCH- Axial Extensors Lie on your back on the floor. You may bend your knees for comfort. Place a rolled-up hand towel or dish towel, about 2 inches in diameter, under the part of your head that makes contact with the floor. Gently tuck your chin, as if trying to make a "double chin," until you feel a gentle stretch at the base of your head. Hold 15-20 seconds. Repeat 2-3 times. Complete this exercise 1 time per day.   STRETCH - Axial Extension  Stand or sit on a firm surface. Assume a good posture: chest up, shoulders drawn back, abdominal muscles slightly tense,  knees unlocked (if standing) and feet hip width apart. Slowly retract your chin so your head slides back and your chin slightly lowers. Continue to look straight ahead. You should feel a gentle stretch in the back of your head. Be certain not to feel an aggressive stretch since this can cause headaches later. Hold for 15-20 seconds. Repeat 2-3 times. Complete this exercise 1 time per day.  STRETCH - Cervical Side Bend  Stand or sit on a firm surface. Assume a good posture: chest up, shoulders drawn back, abdominal muscles slightly tense, knees unlocked (if standing) and feet hip width apart. Without letting your nose or shoulders move, slowly tip your right / left ear to your shoulder until your feel a gentle stretch in the muscles on the opposite side of your neck. Hold 15-20 seconds. Repeat 2-3 times. Complete this exercise 1-2 times per day.  STRETCH - Cervical Rotators  Stand or sit on a firm surface. Assume a good posture: chest up, shoulders drawn back, abdominal muscles slightly tense, knees unlocked (if standing) and feet hip width apart. Keeping your eyes level with the ground, slowly turn your head until you feel a gentle stretch along the back and opposite side of your neck. Hold 15-20 seconds. Repeat 2-3 times. Complete this exercise 1-2 times per day.  RANGE OF MOTION - Neck Circles  Stand or sit on a firm surface. Assume a good posture: chest up, shoulders drawn back, abdominal muscles slightly tense, knees unlocked (if standing) and feet hip width apart. Gently roll your head down and around from the  back of one shoulder to the back of the other. The motion should never be forced or painful. Repeat the motion 10-20 times, or until you feel the neck muscles relax and loosen. Repeat 2-3 times. Complete the exercise 1-2 times per day. STRENGTHENING EXERCISES - Cervical Strain and Sprain These exercises may help you when beginning to rehabilitate your injury. They may resolve your  symptoms with or without further involvement from your physician, physical therapist, or athletic trainer. While completing these exercises, remember:  Muscles can gain both the endurance and the strength needed for everyday activities through controlled exercises. Complete these exercises as instructed by your physician, physical therapist, or athletic trainer. Progress the resistance and repetitions only as guided. You may experience muscle soreness or fatigue, but the pain or discomfort you are trying to eliminate should never worsen during these exercises. If this pain does worsen, stop and make certain you are following the directions exactly. If the pain is still present after adjustments, discontinue the exercise until you can discuss the trouble with your clinician.  STRENGTH - Cervical Flexors, Isometric Face a wall, standing about 6 inches away. Place a small pillow, a ball about 6-8 inches in diameter, or a folded towel between your forehead and the wall. Slightly tuck your chin and gently push your forehead into the soft object. Push only with mild to moderate intensity, building up tension gradually. Keep your jaw and forehead relaxed. Hold 10 to 20 seconds. Keep your breathing relaxed. Release the tension slowly. Relax your neck muscles completely before you start the next repetition. Repeat 2-3 times. Complete this exercise 1 time per day.  STRENGTH- Cervical Lateral Flexors, Isometric  Stand about 6 inches away from a wall. Place a small pillow, a ball about 6-8 inches in diameter, or a folded towel between the side of your head and the wall. Slightly tuck your chin and gently tilt your head into the soft object. Push only with mild to moderate intensity, building up tension gradually. Keep your jaw and forehead relaxed. Hold 10 to 20 seconds. Keep your breathing relaxed. Release the tension slowly. Relax your neck muscles completely before you start the next repetition. Repeat 2-3  times. Complete this exercise 1 time per day.  STRENGTH - Cervical Extensors, Isometric  Stand about 6 inches away from a wall. Place a small pillow, a ball about 6-8 inches in diameter, or a folded towel between the back of your head and the wall. Slightly tuck your chin and gently tilt your head back into the soft object. Push only with mild to moderate intensity, building up tension gradually. Keep your jaw and forehead relaxed. Hold 10 to 20 seconds. Keep your breathing relaxed. Release the tension slowly. Relax your neck muscles completely before you start the next repetition. Repeat 2-3 times. Complete this exercise 1 time per day.  POSTURE AND BODY MECHANICS CONSIDERATIONS Keeping correct posture when sitting, standing or completing your activities will reduce the stress put on different body tissues, allowing injured tissues a chance to heal and limiting painful experiences. The following are general guidelines for improved posture. Your physician or physical therapist will provide you with any instructions specific to your needs. While reading these guidelines, remember: The exercises prescribed by your provider will help you have the flexibility and strength to maintain correct postures. The correct posture provides the optimal environment for your joints to work. All of your joints have less wear and tear when properly supported by a spine with good posture.  This means you will experience a healthier, less painful body. Correct posture must be practiced with all of your activities, especially prolonged sitting and standing. Correct posture is as important when doing repetitive low-stress activities (typing) as it is when doing a single heavy-load activity (lifting).  PROLONGED STANDING WHILE SLIGHTLY LEANING FORWARD When completing a task that requires you to lean forward while standing in one place for a long time, place either foot up on a stationary 2- to 4-inch high object to help  maintain the best posture. When both feet are on the ground, the low back tends to lose its slight inward curve. If this curve flattens (or becomes too large), then the back and your other joints will experience too much stress, fatigue more quickly, and can cause pain.   RESTING POSITIONS Consider which positions are most painful for you when choosing a resting position. If you have pain with flexion-based activities (sitting, bending, stooping, squatting), choose a position that allows you to rest in a less flexed posture. You would want to avoid curling into a fetal position on your side. If your pain worsens with extension-based activities (prolonged standing, working overhead), avoid resting in an extended position such as sleeping on your stomach. Most people will find more comfort when they rest with their spine in a more neutral position, neither too rounded nor too arched. Lying on a non-sagging bed on your side with a pillow between your knees, or on your back with a pillow under your knees will often provide some relief. Keep in mind, being in any one position for a prolonged period of time, no matter how correct your posture, can still lead to stiffness.  WALKING Walk with an upright posture. Your ears, shoulders, and hips should all line up. OFFICE WORK When working at a desk, create an environment that supports good, upright posture. Without extra support, muscles fatigue and lead to excessive strain on joints and other tissues.  CHAIR: A chair should be able to slide under your desk when your back makes contact with the back of the chair. This allows you to work closely. The chair's height should allow your eyes to be level with the upper part of your monitor and your hands to be slightly lower than your elbows. Body position: Your feet should make contact with the floor. If this is not possible, use a foot rest. Keep your ears over your shoulders. This will reduce stress on your neck  and low back.  EXERCISES  RANGE OF MOTION (ROM) AND STRETCHING EXERCISES - Low Back Pain Most people with lower back pain will find that their symptoms get worse with excessive bending forward (flexion) or arching at the lower back (extension). The exercises that will help resolve your symptoms will focus on the opposite motion.  If you have pain, numbness or tingling which travels down into your buttocks, leg or foot, the goal of the therapy is for these symptoms to move closer to your back and eventually resolve. Sometimes, these leg symptoms will get better, but your lower back pain may worsen. This is often an indication of progress in your rehabilitation. Be very alert to any changes in your symptoms and the activities in which you participated in the 24 hours prior to the change. Sharing this information with your caregiver will allow him or her to most efficiently treat your condition. These exercises may help you when beginning to rehabilitate your injury. Your symptoms may resolve with or without further involvement  from your physician, physical therapist or athletic trainer. While completing these exercises, remember:  Restoring tissue flexibility helps normal motion to return to the joints. This allows healthier, less painful movement and activity. An effective stretch should be held for at least 30 seconds. A stretch should never be painful. You should only feel a gentle lengthening or release in the stretched tissue. FLEXION RANGE OF MOTION AND STRETCHING EXERCISES:  STRETCH - Flexion, Single Knee to Chest  Lie on a firm bed or floor with both legs extended in front of you. Keeping one leg in contact with the floor, bring your opposite knee to your chest. Hold your leg in place by either grabbing behind your thigh or at your knee. Pull until you feel a gentle stretch in your low back. Hold 30 seconds. Slowly release your grasp and repeat the exercise with the opposite side. Repeat 2  times. Complete this exercise 3 times per week.   STRETCH - Flexion, Double Knee to Chest Lie on a firm bed or floor with both legs extended in front of you. Keeping one leg in contact with the floor, bring your opposite knee to your chest. Tense your stomach muscles to support your back and then lift your other knee to your chest. Hold your legs in place by either grabbing behind your thighs or at your knees. Pull both knees toward your chest until you feel a gentle stretch in your low back. Hold 30 seconds. Tense your stomach muscles and slowly return one leg at a time to the floor. Repeat 2 times. Complete this exercise 3 times per week.   STRETCH - Low Trunk Rotation Lie on a firm bed or floor. Keeping your legs in front of you, bend your knees so they are both pointed toward the ceiling and your feet are flat on the floor. Extend your arms out to the side. This will stabilize your upper body by keeping your shoulders in contact with the floor. Gently and slowly drop both knees together to one side until you feel a gentle stretch in your low back. Hold for 30 seconds. Tense your stomach muscles to support your lower back as you bring your knees back to the starting position. Repeat the exercise to the other side. Repeat 2 times. Complete this exercise at least 3 times per week.   EXTENSION RANGE OF MOTION AND FLEXIBILITY EXERCISES:  STRETCH - Extension, Prone on Elbows  Lie on your stomach on the floor, a bed will be too soft. Place your palms about shoulder width apart and at the height of your head. Place your elbows under your shoulders. If this is too painful, stack pillows under your chest. Allow your body to relax so that your hips drop lower and make contact more completely with the floor. Hold this position for 30 seconds. Slowly return to lying flat on the floor. Repeat 2 times. Complete this exercise 3 times per week.   RANGE OF MOTION - Extension, Prone Press Ups Lie on  your stomach on the floor, a bed will be too soft. Place your palms about shoulder width apart and at the height of your head. Keeping your back as relaxed as possible, slowly straighten your elbows while keeping your hips on the floor. You may adjust the placement of your hands to maximize your comfort. As you gain motion, your hands will come more underneath your shoulders. Hold this position 30 seconds. Slowly return to lying flat on the floor. Repeat 2 times. Complete  this exercise 3 times per week.   RANGE OF MOTION- Quadruped, Neutral Spine  Assume a hands and knees position on a firm surface. Keep your hands under your shoulders and your knees under your hips. You may place padding under your knees for comfort. Drop your head and point your tailbone toward the ground below you. This will round out your lower back like an angry cat. Hold this position for 30 seconds. Slowly lift your head and release your tail bone so that your back sags into a large arch, like an old horse. Hold this position for 30 seconds. Repeat this until you feel limber in your low back. Now, find your "sweet spot." This will be the most comfortable position somewhere between the two previous positions. This is your neutral spine. Once you have found this position, tense your stomach muscles to support your low back. Hold this position for 30 seconds. Repeat 2 times. Complete this exercise 3 times per week.   STRENGTHENING EXERCISES - Low Back Sprain These exercises may help you when beginning to rehabilitate your injury. These exercises should be done near your "sweet spot." This is the neutral, low-back arch, somewhere between fully rounded and fully arched, that is your least painful position. When performed in this safe range of motion, these exercises can be used for people who have either a flexion or extension based injury. These exercises may resolve your symptoms with or without further involvement from your  physician, physical therapist or athletic trainer. While completing these exercises, remember:  Muscles can gain both the endurance and the strength needed for everyday activities through controlled exercises. Complete these exercises as instructed by your physician, physical therapist or athletic trainer. Increase the resistance and repetitions only as guided. You may experience muscle soreness or fatigue, but the pain or discomfort you are trying to eliminate should never worsen during these exercises. If this pain does worsen, stop and make certain you are following the directions exactly. If the pain is still present after adjustments, discontinue the exercise until you can discuss the trouble with your caregiver.  STRENGTHENING - Deep Abdominals, Pelvic Tilt  Lie on a firm bed or floor. Keeping your legs in front of you, bend your knees so they are both pointed toward the ceiling and your feet are flat on the floor. Tense your lower abdominal muscles to press your low back into the floor. This motion will rotate your pelvis so that your tail bone is scooping upwards rather than pointing at your feet or into the floor. With a gentle tension and even breathing, hold this position for 3 seconds. Repeat 2 times. Complete this exercise 3 times per week.   STRENGTHENING - Abdominals, Crunches  Lie on a firm bed or floor. Keeping your legs in front of you, bend your knees so they are both pointed toward the ceiling and your feet are flat on the floor. Cross your arms over your chest. Slightly tip your chin down without bending your neck. Tense your abdominals and slowly lift your trunk high enough to just clear your shoulder blades. Lifting higher can put excessive stress on the lower back and does not further strengthen your abdominal muscles. Control your return to the starting position. Repeat 2 times. Complete this exercise 3 times per week.   STRENGTHENING - Quadruped, Opposite UE/LE Lift   Assume a hands and knees position on a firm surface. Keep your hands under your shoulders and your knees under your hips. You may  place padding under your knees for comfort. Find your neutral spine and gently tense your abdominal muscles so that you can maintain this position. Your shoulders and hips should form a rectangle that is parallel with the floor and is not twisted. Keeping your trunk steady, lift your right hand no higher than your shoulder and then your left leg no higher than your hip. Make sure you are not holding your breath. Hold this position for 30 seconds. Continuing to keep your abdominal muscles tense and your back steady, slowly return to your starting position. Repeat with the opposite arm and leg. Repeat 2 times. Complete this exercise 3 times per week.   STRENGTHENING - Abdominals and Quadriceps, Straight Leg Raise  Lie on a firm bed or floor with both legs extended in front of you. Keeping one leg in contact with the floor, bend the other knee so that your foot can rest flat on the floor. Find your neutral spine, and tense your abdominal muscles to maintain your spinal position throughout the exercise. Slowly lift your straight leg off the floor about 6 inches for a count of 3, making sure to not hold your breath. Still keeping your neutral spine, slowly lower your leg all the way to the floor. Repeat this exercise with each leg 2 times. Complete this exercise 3 times per week.  POSTURE AND BODY MECHANICS CONSIDERATIONS - Low Back Sprain Keeping correct posture when sitting, standing or completing your activities will reduce the stress put on different body tissues, allowing injured tissues a chance to heal and limiting painful experiences. The following are general guidelines for improved posture.  While reading these guidelines, remember: The exercises prescribed by your provider will help you have the flexibility and strength to maintain correct postures. The correct  posture provides the best environment for your joints to work. All of your joints have less wear and tear when properly supported by a spine with good posture. This means you will experience a healthier, less painful body. Correct posture must be practiced with all of your activities, especially prolonged sitting and standing. Correct posture is as important when doing repetitive low-stress activities (typing) as it is when doing a single heavy-load activity (lifting).  RESTING POSITIONS Consider which positions are most painful for you when choosing a resting position. If you have pain with flexion-based activities (sitting, bending, stooping, squatting), choose a position that allows you to rest in a less flexed posture. You would want to avoid curling into a fetal position on your side. If your pain worsens with extension-based activities (prolonged standing, working overhead), avoid resting in an extended position such as sleeping on your stomach. Most people will find more comfort when they rest with their spine in a more neutral position, neither too rounded nor too arched. Lying on a non-sagging bed on your side with a pillow between your knees, or on your back with a pillow under your knees will often provide some relief. Keep in mind, being in any one position for a prolonged period of time, no matter how correct your posture, can still lead to stiffness.  PROPER SITTING POSTURE In order to minimize stress and discomfort on your spine, you must sit with correct posture. Sitting with good posture should be effortless for a healthy body. Returning to good posture is a gradual process. Many people can work toward this most comfortably by using various supports until they have the flexibility and strength to maintain this posture on their own. When sitting  with proper posture, your ears will fall over your shoulders and your shoulders will fall over your hips. You should use the back of the chair to  support your upper back. Your lower back will be in a neutral position, just slightly arched. You may place a small pillow or folded towel at the base of your lower back for  support.  When working at a desk, create an environment that supports good, upright posture. Without extra support, muscles tire, which leads to excessive strain on joints and other tissues. Keep these recommendations in mind:  CHAIR: A chair should be able to slide under your desk when your back makes contact with the back of the chair. This allows you to work closely. The chair's height should allow your eyes to be level with the upper part of your monitor and your hands to be slightly lower than your elbows.  BODY POSITION Your feet should make contact with the floor. If this is not possible, use a foot rest. Keep your ears over your shoulders. This will reduce stress on your neck and low back.  INCORRECT SITTING POSTURES  If you are feeling tired and unable to assume a healthy sitting posture, do not slouch or slump. This puts excessive strain on your back tissues, causing more damage and pain. Healthier options include: Using more support, like a lumbar pillow. Switching tasks to something that requires you to be upright or walking. Talking a brief walk. Lying down to rest in a neutral-spine position.  PROLONGED STANDING WHILE SLIGHTLY LEANING FORWARD  When completing a task that requires you to lean forward while standing in one place for a long time, place either foot up on a stationary 2-4 inch high object to help maintain the best posture. When both feet are on the ground, the lower back tends to lose its slight inward curve. If this curve flattens (or becomes too large), then the back and your other joints will experience too much stress, tire more quickly, and can cause pain.  CORRECT STANDING POSTURES Proper standing posture should be assumed with all daily activities, even if they only take a few moments,  like when brushing your teeth. As in sitting, your ears should fall over your shoulders and your shoulders should fall over your hips. You should keep a slight tension in your abdominal muscles to brace your spine. Your tailbone should point down to the ground, not behind your body, resulting in an over-extended swayback posture.   INCORRECT STANDING POSTURES  Common incorrect standing postures include a forward head, locked knees and/or an excessive swayback. WALKING Walk with an upright posture. Your ears, shoulders and hips should all line-up.  PROLONGED ACTIVITY IN A FLEXED POSITION When completing a task that requires you to bend forward at your waist or lean over a low surface, try to find a way to stabilize 3 out of 4 of your limbs. You can place a hand or elbow on your thigh or rest a knee on the surface you are reaching across. This will provide you more stability, so that your muscles do not tire as quickly. By keeping your knees relaxed, or slightly bent, you will also reduce stress across your lower back. CORRECT LIFTING TECHNIQUES  DO : Assume a wide stance. This will provide you more stability and the opportunity to get as close as possible to the object which you are lifting. Tense your abdominals to brace your spine. Bend at the knees and hips. Keeping your back  locked in a neutral-spine position, lift using your leg muscles. Lift with your legs, keeping your back straight. Test the weight of unknown objects before attempting to lift them. Try to keep your elbows locked down at your sides in order get the best strength from your shoulders when carrying an object.   Always ask for help when lifting heavy or awkward objects. INCORRECT LIFTING TECHNIQUES DO NOT:  Lock your knees when lifting, even if it is a small object. Bend and twist. Pivot at your feet or move your feet when needing to change directions. Assume that you can safely pick up even a paperclip without proper  posture.

## 2022-04-13 NOTE — Telephone Encounter (Signed)
PA approved. Effective 04/13/2022 - 05/13/2022
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
## 2022-04-13 NOTE — Telephone Encounter (Signed)
PA initiated via Covermymeds; KEY: [id number]. Awaiting determination.

## 2022-04-14 ENCOUNTER — Other Ambulatory Visit (HOSPITAL_COMMUNITY): Payer: Self-pay | Admitting: Surgery

## 2022-04-14 DIAGNOSIS — I5022 Chronic systolic (congestive) heart failure: Secondary | ICD-10-CM

## 2022-04-14 DIAGNOSIS — I10 Essential (primary) hypertension: Secondary | ICD-10-CM

## 2022-04-14 MED ORDER — TORSEMIDE 20 MG PO TABS: 20.0000 mg | ORAL_TABLET | Freq: Every day | ORAL | 11 refills | Status: DC

## 2022-04-14 NOTE — Telephone Encounter (Signed)
Patient informed of approval.  

## 2022-04-14 NOTE — Progress Notes (Signed)
Medlist updated to reflect latest provider orders.

## 2022-04-17 ENCOUNTER — Ambulatory Visit (INDEPENDENT_AMBULATORY_CARE_PROVIDER_SITE_OTHER): Payer: No Typology Code available for payment source

## 2022-04-17 DIAGNOSIS — I5022 Chronic systolic (congestive) heart failure: Secondary | ICD-10-CM | POA: Diagnosis not present

## 2022-04-18 ENCOUNTER — Telehealth (HOSPITAL_COMMUNITY): Payer: Self-pay

## 2022-04-18 LAB — CUP PACEART REMOTE DEVICE CHECK
Battery Remaining Longevity: 131 mo
Battery Voltage: 3.03 V
Brady Statistic RV Percent Paced: 0 %
Date Time Interrogation Session: 20230918072207
HighPow Impedance: 47 Ohm
Implantable Lead Implant Date: 20230616
Implantable Lead Location: 753860
Implantable Lead Model: 6935
Implantable Pulse Generator Implant Date: 20230616
Lead Channel Impedance Value: 342 Ohm
Lead Channel Impedance Value: 437 Ohm
Lead Channel Pacing Threshold Amplitude: 0.625 V
Lead Channel Pacing Threshold Pulse Width: 0.4 ms
Lead Channel Sensing Intrinsic Amplitude: 20 mV
Lead Channel Sensing Intrinsic Amplitude: 20 mV
Lead Channel Setting Pacing Amplitude: 2 V
Lead Channel Setting Pacing Pulse Width: 0.4 ms
Lead Channel Setting Sensing Sensitivity: 0.3 mV

## 2022-04-18 NOTE — Telephone Encounter (Signed)
Called to confirm/remind patient of their appointment at [facility] on 04[facility]19/22.   Patient reminded to bring all medications and/or complete list.  Confirmed patient has transportation. Gave directions, instructed to utilize valet parking.  Confirmed appointment prior to ending call.   

## 2022-04-19 ENCOUNTER — Encounter: Payer: Self-pay | Admitting: Internal Medicine

## 2022-04-19 ENCOUNTER — Other Ambulatory Visit: Payer: Self-pay | Admitting: Physical Medicine and Rehabilitation

## 2022-04-19 ENCOUNTER — Encounter (HOSPITAL_COMMUNITY): Payer: Self-pay

## 2022-04-19 ENCOUNTER — Other Ambulatory Visit (HOSPITAL_COMMUNITY): Payer: Self-pay | Admitting: Emergency Medicine

## 2022-04-19 ENCOUNTER — Ambulatory Visit (HOSPITAL_COMMUNITY)
Admission: RE | Admit: 2022-04-19 | Discharge: 2022-04-19 | Disposition: A | Discharge status: Home / Self Care | Payer: No Typology Code available for payment source | Source: Ambulatory Visit | Attending: Family Medicine | Admitting: Family Medicine

## 2022-04-19 ENCOUNTER — Telehealth: Payer: Self-pay | Admitting: Licensed Clinical Social Worker

## 2022-04-19 ENCOUNTER — Ambulatory Visit (INDEPENDENT_AMBULATORY_CARE_PROVIDER_SITE_OTHER): Payer: No Typology Code available for payment source | Admitting: Internal Medicine

## 2022-04-19 VITALS — BP 146/88 | HR 94 | Ht 71.0 in | Wt 280.4 lb

## 2022-04-19 VITALS — BP 146/80 | HR 93 | Wt 281.4 lb

## 2022-04-19 DIAGNOSIS — Z89511 Acquired absence of right leg below knee: Secondary | ICD-10-CM | POA: Diagnosis not present

## 2022-04-19 DIAGNOSIS — Z794 Long term (current) use of insulin: Secondary | ICD-10-CM

## 2022-04-19 DIAGNOSIS — E669 Obesity, unspecified: Secondary | ICD-10-CM | POA: Diagnosis not present

## 2022-04-19 DIAGNOSIS — I252 Old myocardial infarction: Secondary | ICD-10-CM | POA: Diagnosis not present

## 2022-04-19 DIAGNOSIS — Z7982 Long term (current) use of aspirin: Secondary | ICD-10-CM | POA: Diagnosis not present

## 2022-04-19 DIAGNOSIS — I10 Essential (primary) hypertension: Secondary | ICD-10-CM

## 2022-04-19 DIAGNOSIS — Z7902 Long term (current) use of antithrombotics/antiplatelets: Secondary | ICD-10-CM | POA: Diagnosis not present

## 2022-04-19 DIAGNOSIS — Z8673 Personal history of transient ischemic attack (TIA), and cerebral infarction without residual deficits: Secondary | ICD-10-CM | POA: Insufficient documentation

## 2022-04-19 DIAGNOSIS — Z95810 Presence of automatic (implantable) cardiac defibrillator: Secondary | ICD-10-CM

## 2022-04-19 DIAGNOSIS — Z79899 Other long term (current) drug therapy: Secondary | ICD-10-CM | POA: Insufficient documentation

## 2022-04-19 DIAGNOSIS — F319 Bipolar disorder, unspecified: Secondary | ICD-10-CM | POA: Diagnosis not present

## 2022-04-19 DIAGNOSIS — E782 Mixed hyperlipidemia: Secondary | ICD-10-CM

## 2022-04-19 DIAGNOSIS — I739 Peripheral vascular disease, unspecified: Secondary | ICD-10-CM

## 2022-04-19 DIAGNOSIS — I272 Pulmonary hypertension, unspecified: Secondary | ICD-10-CM | POA: Insufficient documentation

## 2022-04-19 DIAGNOSIS — E785 Hyperlipidemia, unspecified: Secondary | ICD-10-CM | POA: Insufficient documentation

## 2022-04-19 DIAGNOSIS — I493 Ventricular premature depolarization: Secondary | ICD-10-CM

## 2022-04-19 DIAGNOSIS — E1136 Type 2 diabetes mellitus with diabetic cataract: Secondary | ICD-10-CM | POA: Diagnosis not present

## 2022-04-19 DIAGNOSIS — G4733 Obstructive sleep apnea (adult) (pediatric): Secondary | ICD-10-CM | POA: Insufficient documentation

## 2022-04-19 DIAGNOSIS — I2510 Atherosclerotic heart disease of native coronary artery without angina pectoris: Secondary | ICD-10-CM | POA: Diagnosis not present

## 2022-04-19 DIAGNOSIS — I5022 Chronic systolic (congestive) heart failure: Secondary | ICD-10-CM

## 2022-04-19 DIAGNOSIS — F209 Schizophrenia, unspecified: Secondary | ICD-10-CM | POA: Diagnosis not present

## 2022-04-19 DIAGNOSIS — E1151 Type 2 diabetes mellitus with diabetic peripheral angiopathy without gangrene: Secondary | ICD-10-CM | POA: Insufficient documentation

## 2022-04-19 DIAGNOSIS — I428 Other cardiomyopathies: Secondary | ICD-10-CM | POA: Diagnosis not present

## 2022-04-19 DIAGNOSIS — E1169 Type 2 diabetes mellitus with other specified complication: Secondary | ICD-10-CM

## 2022-04-19 DIAGNOSIS — I110 Hypertensive heart disease with heart failure: Secondary | ICD-10-CM | POA: Insufficient documentation

## 2022-04-19 DIAGNOSIS — R110 Nausea: Secondary | ICD-10-CM | POA: Insufficient documentation

## 2022-04-19 LAB — BASIC METABOLIC PANEL
Anion gap: 10 (ref 5–15)
BUN: 16 mg/dL (ref 6–20)
CO2: 24 mmol/L (ref 22–32)
Calcium: 8.5 mg/dL — ABNORMAL LOW (ref 8.9–10.3)
Chloride: 107 mmol/L (ref 98–111)
Creatinine, Ser: 1.48 mg/dL — ABNORMAL HIGH (ref 0.61–1.24)
GFR, Estimated: 56 mL/min — ABNORMAL LOW (ref >60)
Glucose, Bld: 221 mg/dL — ABNORMAL HIGH (ref 70–99)
Potassium: 4 mmol/L (ref 3.5–5.1)
Sodium: 141 mmol/L (ref 135–145)

## 2022-04-19 LAB — BRAIN NATRIURETIC PEPTIDE: B Natriuretic Peptide: 576.9 pg/mL — ABNORMAL HIGH (ref 0.0–100.0)

## 2022-04-19 MED ORDER — TORSEMIDE 20 MG PO TABS: 40.0000 mg | ORAL_TABLET | Freq: Every day | ORAL | 11 refills | Status: DC

## 2022-04-19 NOTE — Telephone Encounter (Signed)
H&V Care Navigation CSW Progress Note  Clinical Social Worker  was contacted by pt with regards  to a missing folder that he believes he may have left at [facility] clinic. I shared that I am at another clinic and the best thing to do would be to reach back out to staff at the clinic to see if they were able to locate it if he left it there. Pt texted clinic number at his request since he did not have a pen and paper.   I then called pt back later to see if any updates. Unfortunately the clinic staff were not able to locate a folder and they did not recall him bringing one with him. Pt understandably upset. He will continue to look and if the clinic staff locates it then the paramedic team will bring to pt/arrange pick up as confirmed with [place]. I remain available as needed.   Patient is participating in a Managed Medicaid Plan:  No, UHC commercial plan only.   SDOH Screenings   Food Insecurity: No Food Insecurity (04/13/2021)  Housing: Low Risk  (04/13/2021)  Transportation Needs: No Transportation Needs (04/13/2021)  Alcohol Screen: Low Risk  (05/15/2018)  Depression (PHQ2-9): Low Risk  (09/06/2021)  Financial Resource Strain: Medium Risk (04/13/2021)  Tobacco Use: Low Risk  (04/19/2022)   Westley Hummer, MSW, [place]  [phone]- work cell phone (preferred) [phone]- desk phone

## 2022-04-19 NOTE — Patient Instructions (Addendum)
INCREASE Torsemide to 40 mg (2 tabs) daily  Labs today We will only contact you if something comes back abnormal or we need to make some changes. Otherwise no news is good news!  Keep follow up as scheduled with Dr [name]  Do the following things EVERYDAY: Weigh yourself in the morning before breakfast. Write it down and keep it in a log. Take your medicines as prescribed Eat low salt foods--Limit salt (sodium) to 2000 mg per day.  Stay as active as you can everyday Limit all fluids for the day to less than 2 liters  At the [facility], you and your health needs are our priority. As part of our continuing mission to provide you with exceptional heart care, we have created designated Provider Care Teams. These Care Teams include your primary Cardiologist (physician) and Advanced Practice Providers (APPs- Physician Assistants and Nurse Practitioners) who all work together to provide you with the care you need, when you need it.   You may see any of the following providers on your designated Care Team at your next follow up: Dr Glori Bickers Dr Loralie Champagne Dr. Roxana Hires, NP Lyda Jester, [place] [facility], [place] Forestine Na, NP Audry Riles, PharmD   Please be sure to bring in all your medications bottles to every appointment.   If you have any questions or concerns before your next appointment please send [place] a message through [place] or call our office at [phone].    TO LEAVE A MESSAGE FOR THE NURSE SELECT OPTION 2, PLEASE LEAVE A MESSAGE INCLUDING: YOUR NAME DATE OF BIRTH CALL BACK NUMBER REASON FOR CALL**this is important as we prioritize the call backs  YOU WILL RECEIVE A CALL BACK THE SAME DAY AS LONG AS YOU CALL BEFORE 4:00 PM

## 2022-04-19 NOTE — Patient Instructions (Addendum)
Medication Instructions:  Your physician recommends that you continue on your current medications as directed. Please refer to the Current Medication list given to you today.  *If you need a refill on your cardiac medications before your next appointment, please call your pharmacy*  Lab Work: None ordered.  If you have labs (blood work) drawn today and your tests are completely normal, you will receive your results only by: [place] (if you have MyChart) OR A paper copy in the mail If you have any lab test that is abnormal or we need to change your treatment, we will call you to review the results.  Testing/Procedures: None ordered.  Follow-Up: At [facility], you and your health needs are our priority.  As part of our continuing mission to provide you with exceptional heart care, we have created designated Provider Care Teams.  These Care Teams include your primary Cardiologist (physician) and Advanced Practice Providers (APPs -  Physician Assistants and Nurse Practitioners) who all work together to provide you with the care you need, when you need it.  We recommend signing up for the patient portal called "MyChart".  Sign up information is provided on this After Visit Summary.  MyChart is used to connect with patients for Virtual Visits (Telemedicine).  Patients are able to view lab/test results, encounter notes, upcoming appointments, etc.  Non-urgent messages can be sent to your provider as well.   To learn more about what you can do with MyChart, go to [email or web address].    Your next appointment:   1 year(s) Work note given to patient for employer.   The format for your next appointment:   In Person  Provider:   Cristopher Peru, MD{or one of the following Advanced Practice Providers on your designated Care Team:   Tommye Standard, [place] Legrand Como "Jonni Sanger" Chalmers Cater, [place]  Remote monitoring is used to monitor your ICD from home. This monitoring reduces the number of office  visits required to check your device to one time per year. It allows [place] to keep an eye on the functioning of your device to ensure it is working properly. You are scheduled for a device check from home on 07/17/2022. You may send your transmission at any time that day. If you have a wireless device, the transmission will be sent automatically. After your physician reviews your transmission, you will receive a postcard with your next transmission date.  Important Information About Sugar

## 2022-04-19 NOTE — Progress Notes (Signed)
Paramedicine Encounter   Patient ID: Mitchell [name] , male,   DOB: [birth year],51 y.o.,  MRN: [id number]   Met patient in clinic today with provider.  Time spent with patient 40 minutes  Met Mitchell [name] @ [facility] Clinic today with [facility].  Pt. Reports to be doing well and has no complaints of chest pain or SOB.  Today only med change is increase Torsemide to 76m. Daily.  Med box reconciled to reflect same.  Pt had not picked up his refills from last visit.  He states he will get them this afternoon and place in pill box.  (Pot Cl, Amitriptyline, Pantoprazole.) visit complete.   DRenee Ramus E[place]9/20/2023
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
## 2022-04-24 ENCOUNTER — Other Ambulatory Visit: Payer: Self-pay

## 2022-04-24 ENCOUNTER — Encounter: Payer: Self-pay | Admitting: Physical Therapy

## 2022-04-24 ENCOUNTER — Ambulatory Visit: Payer: No Typology Code available for payment source | Attending: Family Medicine | Admitting: Physical Therapy

## 2022-04-24 ENCOUNTER — Telehealth (HOSPITAL_COMMUNITY): Payer: Self-pay | Admitting: Emergency Medicine

## 2022-04-24 DIAGNOSIS — M5413 Radiculopathy, cervicothoracic region: Secondary | ICD-10-CM

## 2022-04-24 DIAGNOSIS — M25612 Stiffness of left shoulder, not elsewhere classified: Secondary | ICD-10-CM | POA: Diagnosis present

## 2022-04-24 DIAGNOSIS — M545 Low back pain, unspecified: Secondary | ICD-10-CM | POA: Diagnosis not present

## 2022-04-24 DIAGNOSIS — M542 Cervicalgia: Secondary | ICD-10-CM | POA: Diagnosis present

## 2022-04-24 DIAGNOSIS — M5459 Other low back pain: Secondary | ICD-10-CM

## 2022-04-24 DIAGNOSIS — M25611 Stiffness of right shoulder, not elsewhere classified: Secondary | ICD-10-CM | POA: Diagnosis present

## 2022-04-24 DIAGNOSIS — M6281 Muscle weakness (generalized): Secondary | ICD-10-CM

## 2022-04-24 DIAGNOSIS — M25511 Pain in right shoulder: Secondary | ICD-10-CM

## 2022-04-24 DIAGNOSIS — R252 Cramp and spasm: Secondary | ICD-10-CM | POA: Diagnosis present

## 2022-04-24 DIAGNOSIS — R293 Abnormal posture: Secondary | ICD-10-CM

## 2022-04-24 NOTE — Telephone Encounter (Signed)
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  And reminded him to bring his meds and pill box for reconciliation. He acknowledged right back to confirm same.

## 2022-04-25 ENCOUNTER — Other Ambulatory Visit (HOSPITAL_COMMUNITY): Payer: Self-pay | Admitting: Emergency Medicine

## 2022-04-25 NOTE — Progress Notes (Signed)
Paramedicine Encounter    Patient ID: Mitchell [name], male    DOB: [birth year], 51 y.o.   MRN: [id number]   BP (!) 152/98 (BP Location: Right Arm, Patient Position: Sitting, Cuff Size: Normal)   Pulse 65   Resp 16   Wt 274 lb 8 oz (124.5 kg)   SpO2 97%   BMI 38.28 kg/m  Weight yesterday-not taken Last visit weight-281.6lb Cbg 364  Visit today for Mitchell [name] was done at his place of work during his lunch break.  He reports to be feeling well.  No complaints of chest pain or SOB.  No edema noted.  Lung sounds clear and equal bilat.  Meds reconciled.  Refill for Potassium Chloride called into [facility] on [address].   Pt had requested a letter from Keturah Shavers for his employer at his last clinic visit explaining the importance of taking phone calls from doctors and the fact that he has to go to the restroom more frequently due to the fluid pills he's taking.  Mitchell [name] requested I follow up to attempt to get the letter and I sent message to [facility] triage regarding same.   Visit complete.    [name], [place] 04/25/2022  Patient Care Team: [name], MD as PCP - General (Family Medicine) [name] Shaune Pascal, MD as PCP - Cardiology (Cardiology)  Patient Active Problem List   Diagnosis Date Noted   ICD (implantable cardioverter-defibrillator) in place 04/19/2022   Near syncope 12/08/2021   CKD (chronic kidney disease) stage 3, GFR 30-59 ml/min ([place]) 12/08/2021   PVD s/p R LE amputation  12/08/2021   Diabetic retinopathy associated with type 2 diabetes mellitus ([place]) 06/06/2021   CAD (coronary artery disease) with flat troponin  04/11/2021   S/P below knee amputation, right ([place]) 02/07/2021   QT prolongation 07/30/2019   Acute on chronic combined systolic and diastolic CHF (congestive heart failure) ([place]) 07/29/2019   Stroke ([place]) 05/08/2017   Uncontrolled type 2 diabetes mellitus with hyperglycemia, with long-term current use of insulin  ([place]) 59/97/7414   Chronic systolic heart failure ([place]) 10/02/2011   Erectile dysfunction 10/02/2011   Obstructive sleep apnea 10/11/2007   HLD (hyperlipidemia) 10/10/2007   HYPOKALEMIA 10/10/2007   Obesity, Class III, BMI 40-49.9 (morbid obesity) ([place]) 10/10/2007   Essential hypertension 10/10/2007   PREMATURE VENTRICULAR CONTRACTIONS 10/10/2007   COLONIC POLYPS, HX OF 10/10/2007    Current Outpatient Medications:    Accu-Chek Softclix Lancets lancets, Use to test blood sugars up to 4 times daily as needed., Disp: 100 each, Rfl: 0   aspirin 81 MG EC tablet, Take 1 tablet (81 mg total) by mouth daily., Disp: 30 tablet, Rfl: 12   carvedilol (COREG) 6.25 MG tablet, Take 6.25 mg by mouth 2 (two) times daily., Disp: , Rfl:    Cholecalciferol (VITAMIN D-3 PO), Take 1 capsule by mouth daily., Disp: , Rfl:    clopidogrel (PLAVIX) 75 MG tablet, Take 1 tablet by mouth once daily, Disp: 90 tablet, Rfl: 3   Insulin Glargine (BASAGLAR KWIKPEN) 100 UNIT/ML, Inject 21 Units into the skin 2 (two) times daily., Disp: , Rfl:    methocarbamol (ROBAXIN) 500 MG tablet, Take 1 tablet (500 mg total) by mouth every 6 (six) hours as needed for muscle spasms., Disp: 30 tablet, Rfl: 0   oxyCODONE (OXY IR/ROXICODONE) 5 MG immediate release tablet, Take 0.5-1 tablets (2.5-5 mg total) by mouth every 6 (six) hours as needed for severe pain., Disp: 30 tablet, Rfl: 0  pantoprazole (PROTONIX) 40 MG tablet, Take 1 tablet (40 mg total) by mouth daily as needed (acid reflux)., Disp: 30 tablet, Rfl: 6   potassium chloride SA (KLOR-CON M) 20 MEQ tablet, Take 20 mEq by mouth daily., Disp: , Rfl:    rosuvastatin (CRESTOR) 40 MG tablet, Take 1 tablet by mouth once daily, Disp: 90 tablet, Rfl: 3   sacubitril-valsartan (ENTRESTO) 97-103 MG, Take 1 tablet by mouth 2 (two) times daily., Disp: 60 tablet, Rfl: 11   spironolactone (ALDACTONE) 25 MG tablet, Take 1 tablet by mouth once daily, Disp: 90 tablet, Rfl: 3   torsemide (DEMADEX)  20 MG tablet, Take 2 tablets (40 mg total) by mouth daily., Disp: 180 tablet, Rfl: 11   amitriptyline (ELAVIL) 10 MG tablet, TAKE 1 TABLET BY MOUTH AT BEDTIME (Patient not taking: Reported on 04/25/2022), Disp: 30 tablet, Rfl: 0   Ascorbic Acid (VITAMIN C PO), Take 1 tablet by mouth daily. (Patient not taking: Reported on 04/25/2022), Disp: , Rfl:    Blood Glucose Monitoring Suppl (BLOOD GLUCOSE MONITOR SYSTEM) w/Device KIT, Use to test blood sugar up to 4 times daily as needed. (Patient not taking: Reported on 04/25/2022), Disp: 1 kit, Rfl: 0   doxycycline (VIBRA-TABS) 100 MG tablet, Take 1 tablet (100 mg total) by mouth 2 (two) times daily. (Patient not taking: Reported on 04/25/2022), Disp: 30 tablet, Rfl: 0   glucose blood (ACCU-CHEK GUIDE) test strip, Use to test blood sugars up to 4 times daily as needed. (Patient not taking: Reported on 04/25/2022), Disp: 100 each, Rfl: 0 Allergies  Allergen Reactions   Farxiga [Dapagliflozin] Other (See Comments)    Patient reports loss of consciousness   Nsaids Anaphylaxis and Rash    Able to tolerate aspirin    Trulicity [Dulaglutide] Nausea And Vomiting      Social History   Socioeconomic History   Marital status: Married    Spouse name: Not on file   Number of children: 5   Years of education: Not on file   Highest education level: Not on file  Occupational History   Occupation: Mortician  Tobacco Use   Smoking status: Never   Smokeless tobacco: Never  Vaping Use   Vaping Use: Never used  Substance and Sexual Activity   Alcohol use: No   Drug use: No   Sexual activity: Not on file  Other Topics Concern   Not on file  Social History Narrative   MARRIED, LIVES [place]; GREW UP IN [place] AND USED TO BE A COOK; HE ENJOYS COOKING AND ENJOYS EATING A LOT OF PORK AND SALT.   Social Determinants of Health   Financial Resource Strain: Medium Risk (04/13/2021)   Overall Financial Resource Strain (CARDIA)    Difficulty  of Paying Living Expenses: Somewhat hard  Food Insecurity: No Food Insecurity (04/13/2021)   Hunger Vital Sign    Worried About Running Out of Food in the Last Year: Never true    Ran Out of Food in the Last Year: Never true  Transportation Needs: No Transportation Needs (04/13/2021)   PRAPARE - Hydrologist (Medical): No    Lack of Transportation (Non-Medical): No  Physical Activity: Not on file  Stress: Not on file  Social Connections: Not on file  Intimate Partner Violence: Not on file    Physical Exam      Future Appointments  Date Time Provider [place]  04/26/2022  4:00 PM Suzan Slick, NP [facility]  None  05/01/2022  2:45 PM Clarene Essex L, PTA [facility]  05/03/2022  3:30 PM Artist Pais, PTA [facility]  05/09/2022  4:15 PM Percival Spanish, PT [facility]  05/15/2022  3:30 PM Artist Pais, PTA [facility]  05/18/2022  3:30 PM Artist Pais, PTA [facility]  05/22/2022  3:30 PM Percival Spanish, PT [facility]  07/05/2022  2:40 PM Bensimhon, Shaune Pascal, MD [facility] None  07/17/2022  7:05 AM [facility] DEVICE REMOTES [facility]  10/16/2022  7:05 AM [facility] DEVICE REMOTES [facility]  01/15/2023  7:05 AM [facility] DEVICE REMOTES [facility]  04/16/2023  7:05 AM [facility] DEVICE REMOTES [facility]       [name], [place] Paramedic  04/25/22

## 2022-04-26 ENCOUNTER — Ambulatory Visit: Payer: No Typology Code available for payment source | Admitting: Family

## 2022-04-26 ENCOUNTER — Encounter (HOSPITAL_COMMUNITY): Payer: Self-pay | Admitting: *Deleted

## 2022-04-28 ENCOUNTER — Other Ambulatory Visit: Payer: Self-pay

## 2022-04-28 ENCOUNTER — Emergency Department (HOSPITAL_BASED_OUTPATIENT_CLINIC_OR_DEPARTMENT_OTHER)
Admission: EM | Admit: 2022-04-28 | Discharge: 2022-04-29 | Disposition: A | Discharge status: Home / Self Care | Payer: No Typology Code available for payment source | Attending: Emergency Medicine | Admitting: Emergency Medicine

## 2022-04-28 DIAGNOSIS — Z794 Long term (current) use of insulin: Secondary | ICD-10-CM | POA: Diagnosis not present

## 2022-04-28 DIAGNOSIS — Z89511 Acquired absence of right leg below knee: Secondary | ICD-10-CM | POA: Diagnosis not present

## 2022-04-28 DIAGNOSIS — M79661 Pain in right lower leg: Secondary | ICD-10-CM | POA: Diagnosis present

## 2022-04-28 DIAGNOSIS — I509 Heart failure, unspecified: Secondary | ICD-10-CM | POA: Diagnosis not present

## 2022-04-28 DIAGNOSIS — E119 Type 2 diabetes mellitus without complications: Secondary | ICD-10-CM | POA: Diagnosis not present

## 2022-04-28 DIAGNOSIS — Z7901 Long term (current) use of anticoagulants: Secondary | ICD-10-CM | POA: Diagnosis not present

## 2022-04-28 DIAGNOSIS — I110 Hypertensive heart disease with heart failure: Secondary | ICD-10-CM | POA: Diagnosis not present

## 2022-04-28 DIAGNOSIS — Z7982 Long term (current) use of aspirin: Secondary | ICD-10-CM | POA: Diagnosis not present

## 2022-04-28 DIAGNOSIS — L03115 Cellulitis of right lower limb: Secondary | ICD-10-CM

## 2022-04-28 MED ORDER — DOXYCYCLINE HYCLATE 100 MG PO TABS
100.0000 mg | ORAL_TABLET | Freq: Once | ORAL | Status: AC
  Administered 2022-04-28: 100 mg via ORAL
  Filled 2022-04-28: qty 1

## 2022-04-28 MED ORDER — DOXYCYCLINE HYCLATE 100 MG PO CAPS: 100.0000 mg | ORAL_CAPSULE | Freq: Two times a day (BID) | ORAL | 0 refills | Status: DC

## 2022-04-28 MED ORDER — GELATIN ABSORBABLE 12-7 MM EX MISC
CUTANEOUS | Status: AC
  Filled 2022-04-28: qty 1

## 2022-04-28 NOTE — ED Triage Notes (Addendum)
Pt to ED in wheelchair with c/o pain, bleeding, and swelling to R BKA. Bleeding controlled and wound covered with bandaid. R BKA sx 01/26/2020. Pt reports his surgeon Dr. Sharol Given recommended he get padding added to prosthetic to prevent the pain and swelling.

## 2022-04-28 NOTE — ED Provider Notes (Signed)
[facility] Provider Note   CSN: [id number] Arrival date & time: 04/28/22  2127     History  Chief Complaint  Patient presents with   Leg Swelling   Leg Pain    Mitchell [name] is a 51 y.o. male.  Patient is a 51 year old male with past medical history of hypertension, CHF, hyperlipidemia, type 2 diabetes, and is status post below the knee amputation of the right lower extremity.  Mitchell [name] presents today for evaluation of a small area of bleeding from the skin of his stump.  Mitchell [name] denies any specific injury or trauma, but states that Mitchell [name] has been on his feet at work wearing his prosthesis.  In the past, Mitchell [name] has been treated with doxycycline.  Mitchell [name] was having some trouble stopping the bleeding so presents for evaluation of this.  Mitchell [name] denies fevers or chills.  The history is provided by the patient.       Home Medications Prior to Admission medications   Medication Sig Start Date End Date Taking? Authorizing Provider  Accu-Chek Softclix Lancets lancets Use to test blood sugars up to 4 times daily as needed. 12/08/21   [name], MD  amitriptyline (ELAVIL) 10 MG tablet TAKE 1 TABLET BY MOUTH AT BEDTIME Patient not taking: Reported on 04/25/2022 12/12/21   Izora Ribas, MD  Ascorbic Acid (VITAMIN C PO) Take 1 tablet by mouth daily. Patient not taking: Reported on 04/25/2022    [provider]  aspirin 81 MG EC tablet Take 1 tablet (81 mg total) by mouth daily. 04/13/21   Danford, Suann Larry, MD  Blood Glucose Monitoring Suppl (BLOOD GLUCOSE MONITOR SYSTEM) w/Device KIT Use to test blood sugar up to 4 times daily as needed. Patient not taking: Reported on 04/25/2022 12/08/21   [name], MD  carvedilol (COREG) 6.25 MG tablet Take 6.25 mg by mouth 2 (two) times daily. 11/30/21   [provider]  Cholecalciferol (VITAMIN D-3 PO) Take 1 capsule by mouth daily.    [provider]  clopidogrel (PLAVIX) 75 MG tablet Take 1 tablet by  mouth once daily 03/17/22   Bensimhon, Shaune Pascal, MD  doxycycline (VIBRA-TABS) 100 MG tablet Take 1 tablet (100 mg total) by mouth 2 (two) times daily. Patient not taking: Reported on 04/25/2022 04/10/22   [name], MD  glucose blood (ACCU-CHEK GUIDE) test strip Use to test blood sugars up to 4 times daily as needed. Patient not taking: Reported on 04/25/2022 12/08/21   [name], MD  Insulin Glargine [facility]) 100 UNIT/ML Inject 21 Units into the skin 2 (two) times daily.    [provider]  methocarbamol (ROBAXIN) 500 MG tablet Take 1 tablet (500 mg total) by mouth every 6 (six) hours as needed for muscle spasms. 04/13/22   Shelda Pal, DO  oxyCODONE (OXY IR/ROXICODONE) 5 MG immediate release tablet Take 0.5-1 tablets (2.5-5 mg total) by mouth every 6 (six) hours as needed for severe pain. 04/13/22   Shelda Pal, DO  pantoprazole (PROTONIX) 40 MG tablet Take 1 tablet (40 mg total) by mouth daily as needed (acid reflux). 02/13/22   [name], [name], MD  potassium chloride SA (KLOR-CON M) 20 MEQ tablet Take 20 mEq by mouth daily.    [provider]  rosuvastatin (CRESTOR) 40 MG tablet Take 1 tablet by mouth once daily 03/17/22   Bensimhon, Shaune Pascal, MD  sacubitril-valsartan (ENTRESTO) 97-103 MG Take 1 tablet by mouth 2 (two) times daily. 05/04/21  Davis, [place], FNP  spironolactone (ALDACTONE) 25 MG tablet Take 1 tablet by mouth once daily 03/17/22   Bensimhon, Shaune Pascal, MD  torsemide (DEMADEX) 20 MG tablet Take 2 tablets (40 mg total) by mouth daily. 04/19/22   [place], Maricela Bo, FNP      Allergies    [name] [dapagliflozin], Nsaids, and Trulicity [dulaglutide]    Review of Systems   Review of Systems  All other systems reviewed and are negative.   Physical Exam Updated Vital Signs BP 114/79   Pulse 98   Ht 5' 11"  (1.803 m)   Wt 126.1 kg   SpO2 97%   BMI 38.77 kg/m  Physical Exam Vitals and nursing note reviewed.   Constitutional:      General: Mitchell [name] is not in acute distress.    Appearance: Normal appearance. Mitchell [name] is not ill-appearing.  HENT:     Head: Normocephalic and atraumatic.  Pulmonary:     Effort: Pulmonary effort is normal.  Musculoskeletal:     Comments: The right below the knee amputation has a pinpoint area along the incision where there is a slow trickle of bleeding.  There is mild surrounding erythema, but no warmth or purulent drainage.  Skin:    General: Skin is warm and dry.  Neurological:     Mental Status: Mitchell [name] is alert.     ED Results / Procedures / Treatments   Labs (all labs ordered are listed, but only abnormal results are displayed) Labs Reviewed - No data to display  EKG None  Radiology No results found.  Procedures Procedures    Medications Ordered in ED Medications  gelatin adsorbable (GELFOAM/SURGIFOAM) 12-7 MM sponge 12-7 mm (has no administration in time range)  doxycycline (VIBRA-TABS) tablet 100 mg (has no administration in time range)    ED Course/ Medical Decision Making/ A&P  Patient presenting with bleeding from his stump.  A Surgifoam dressing was applied using Tegaderm over top of the area.  This seemed to control the bleeding.  Patient will be treated with doxycycline and follow-up with his orthopedic surgeon.  Final Clinical Impression(s) / ED Diagnoses Final diagnoses:  None    Rx / DC Orders ED Discharge Orders     None         Veryl Speak, MD 04/28/22 2323

## 2022-04-28 NOTE — Discharge Instructions (Signed)
Begin taking doxycycline as prescribed.  Leave dressing in place for the next 24 hours before removal.  Follow-up with Dr. Sharol Given next week if still experiencing additional problems.

## 2022-04-29 NOTE — ED Notes (Signed)
D/c paperwork reviewed with pt, including prescription. No questions or concerns at time of d/c. Pt assisted to personal wheelchair, wheeled to ED exit to meet ride.

## 2022-04-29 NOTE — Progress Notes (Signed)
Remote ICD transmission.   

## 2022-05-01 ENCOUNTER — Ambulatory Visit: Payer: No Typology Code available for payment source | Attending: Family Medicine

## 2022-05-01 ENCOUNTER — Encounter: Payer: Self-pay | Admitting: Orthopedic Surgery

## 2022-05-01 ENCOUNTER — Ambulatory Visit (INDEPENDENT_AMBULATORY_CARE_PROVIDER_SITE_OTHER): Payer: No Typology Code available for payment source | Admitting: Orthopedic Surgery

## 2022-05-01 DIAGNOSIS — M7989 Other specified soft tissue disorders: Secondary | ICD-10-CM | POA: Diagnosis not present

## 2022-05-01 DIAGNOSIS — M5459 Other low back pain: Secondary | ICD-10-CM | POA: Diagnosis present

## 2022-05-01 DIAGNOSIS — M79661 Pain in right lower leg: Secondary | ICD-10-CM

## 2022-05-01 DIAGNOSIS — M25612 Stiffness of left shoulder, not elsewhere classified: Secondary | ICD-10-CM | POA: Insufficient documentation

## 2022-05-01 DIAGNOSIS — M25611 Stiffness of right shoulder, not elsewhere classified: Secondary | ICD-10-CM | POA: Insufficient documentation

## 2022-05-01 DIAGNOSIS — M6281 Muscle weakness (generalized): Secondary | ICD-10-CM | POA: Insufficient documentation

## 2022-05-01 DIAGNOSIS — M25511 Pain in right shoulder: Secondary | ICD-10-CM | POA: Insufficient documentation

## 2022-05-01 DIAGNOSIS — Z89511 Acquired absence of right leg below knee: Secondary | ICD-10-CM | POA: Diagnosis not present

## 2022-05-01 DIAGNOSIS — R293 Abnormal posture: Secondary | ICD-10-CM | POA: Insufficient documentation

## 2022-05-01 DIAGNOSIS — M542 Cervicalgia: Secondary | ICD-10-CM | POA: Insufficient documentation

## 2022-05-01 DIAGNOSIS — M5413 Radiculopathy, cervicothoracic region: Secondary | ICD-10-CM | POA: Diagnosis present

## 2022-05-01 DIAGNOSIS — R252 Cramp and spasm: Secondary | ICD-10-CM | POA: Diagnosis present
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
## 2022-05-01 NOTE — Progress Notes (Signed)
Office Visit Note   Patient: Mitchell [name]           Date of Birth: [birth year]           MRN: [id number] Visit Date: 05/01/2022              Requested by: [name], MD [place] [place],  [place] [address] PCP: [name], MD  Chief Complaint  Patient presents with   Right Leg - Follow-up    ER med center in [place] 04/28/2022 Hx right BKA 01/25/2021      HPI: Patient is a 51 year old gentleman who presents in follow-up for swelling and a draining ulcer right transtibial amputation.  Patient was recently in the emergency room on 04/28/2022.  Patient has started doxycycline.  Patient states he still has pain swelling and drainage from the residual limb.  Assessment & Plan: Visit Diagnoses:  1. S/P below knee amputation, right (HCC)   2. Pain and swelling of right lower leg     Plan: Patient was given a prescription for [employer] for a new socket liner materials and supplies.  He will continue dry dressing changes and compression until the ulcer is healed.  Follow-Up Instructions: Return in about 4 weeks (around 05/29/2022).   Ortho Exam  Patient is alert, oriented, no adenopathy, well-dressed, normal affect, normal respiratory effort. Examination patient has fluctuance in the right lower extremity.  With compression I was able to drain clear serosanguineous fluid there is no purulence no cellulitis no signs of any deep infection.  Patient has developed swelling secondary to subsiding into his socket.  Patient is an existing right transtibial  amputee.  Patient's current comorbidities are not expected to impact the ability to function with the prescribed prosthesis. Patient verbally communicates a strong desire to use a prosthesis. Patient currently requires mobility aids to ambulate without a prosthesis.  Expects not to use mobility aids with a new prosthesis.  Patient is a K3 level ambulator that spends a lot of time walking around on uneven  terrain over obstacles, up and down stairs, and ambulates with a variable cadence.     Imaging: No results found. No images are attached to the encounter.  Labs: Lab Results  Component Value Date   HGBA1C 7.8 (A) 08/19/2021   HGBA1C 7.9 (A) 06/08/2021   HGBA1C 7.8 (H) 04/11/2021   ESRSEDRATE 20 (H) 03/08/2022   ESRSEDRATE 81 (H) 10/21/2019   ESRSEDRATE 25 (H) 06/17/2018   CRP 10.3 (H) 01/28/2021   CRP 12.8 (H) 01/27/2021   CRP 21.1 (H) 01/26/2021   REPTSTATUS 01/25/2021 FINAL 01/24/2021   GRAMSTAIN  08/29/2018    RARE WBC PRESENT, PREDOMINANTLY PMN NO ORGANISMS SEEN    CULT  01/24/2021    NO GROWTH Performed at [facility], [place] [address], [place], [place] [address]    [place] 05/21/2018   LABORGA ENTEROBACTER SPECIES 05/21/2018   LABORGA STAPHYLOCOCCUS AUREUS 05/21/2018     Lab Results  Component Value Date   ALBUMIN 3.8 03/15/2022   ALBUMIN 3.6 01/12/2022   ALBUMIN 3.6 12/08/2021   PREALBUMIN 6.8 (L) 10/21/2019    Lab Results  Component Value Date   MG 2.3 11/29/2021   MG 1.5 (L) 04/12/2021   MG 1.8 02/16/2021   Lab Results  Component Value Date   VD25OH 11 (L) 08/16/2009    Lab Results  Component Value Date   PREALBUMIN 6.8 (L) 10/21/2019  Latest Ref Rng & Units 03/27/2022    3:31 AM 03/15/2022    6:38 PM 03/08/2022    9:46 AM  CBC EXTENDED  WBC 4.0 - 10.5 K/uL 14.8  12.0  10.9   RBC 4.22 - 5.81 MIL/uL 3.75  3.96  4.19   Hemoglobin 13.0 - 17.0 g/dL 11.2  11.6  12.5   HCT 39.0 - 52.0 % 34.4  35.7  37.6   Platelets 150 - 400 K/uL 185  205  155   NEUT# 1.7 - 7.7 K/uL 12.9   9.2   Lymph# 0.7 - 4.0 K/uL 0.8   0.8      There is no height or weight on file to calculate BMI.  Orders:  No orders of the defined types were placed in this encounter.  No orders of the defined types were placed in this encounter.    Procedures: No procedures performed  Clinical Data: No additional findings.  ROS:  All other  systems negative, except as noted in the HPI. Review of Systems  Objective: Vital Signs: There were no vitals taken for this visit.  Specialty Comments:  No specialty comments available.  PMFS History: Patient Active Problem List   Diagnosis Date Noted   ICD (implantable cardioverter-defibrillator) in place 04/19/2022   Near syncope 12/08/2021   CKD (chronic kidney disease) stage 3, GFR 30-59 ml/min ([place]) 12/08/2021   PVD s/p R LE amputation  12/08/2021   Diabetic retinopathy associated with type 2 diabetes mellitus ([place]) 06/06/2021   CAD (coronary artery disease) with flat troponin  04/11/2021   S/P below knee amputation, right ([place]) 02/07/2021   QT prolongation 07/30/2019   Acute on chronic combined systolic and diastolic CHF (congestive heart failure) ([place]) 07/29/2019   Stroke ([place]) 05/08/2017   Uncontrolled type 2 diabetes mellitus with hyperglycemia, with long-term current use of insulin ([place]) [id number]   Chronic systolic heart failure ([place]) 10/02/2011   Erectile dysfunction 10/02/2011   Obstructive sleep apnea 10/11/2007   HLD (hyperlipidemia) 10/10/2007   HYPOKALEMIA 10/10/2007   Obesity, Class III, BMI 40-49.9 (morbid obesity) ([place]) 10/10/2007   Essential hypertension 10/10/2007   PREMATURE VENTRICULAR CONTRACTIONS 10/10/2007   COLONIC POLYPS, HX OF 10/10/2007   Past Medical History:  Diagnosis Date   Bipolar disorder ([place])    CAD (coronary artery disease)    a. diffuse 3v CAD by cath 2019, medical therapy recommended.   Cataract    forming    Chronic systolic CHF (congestive heart failure) (HCC)    CVA (cerebral infarction)    No residual deficits   Depression    PTSD,    Diabetes mellitus    TYPE II; UNCONTROLLED BY HEMOGLOBIN A1c; STABLE AS  PER DISCHARGE   Headache(784.0)    Herpes simplex of male genitalia    History of colonic polyps    Hyperlipidemia    Hypertension    Myocardial infarction ([place]) 1987   (while playing football)   Obesity    OSA  (obstructive sleep apnea)    repeat study 2018 without significant OSA   Pneumonia    Post-cardiac injury syndrome ([place])    History of cardiac injury from blunt trauma   Pulmonary hypertension ([place])    a. moderately elevated PASP 07/2019.   PVCs (premature ventricular contractions)    Schizophrenia ([place])    Goes to [facility]   Sleep apnea    Stroke [facility]) 2005   some left side weakness   Syncope    Recurrent, thought  to be vasovagal. Also has h/o frequent PVCs.     Family History  Problem Relation Age of Onset   Heart disease Mother        MI   Heart failure Mother    Diabetes Mother        ALSO IN MOST OF HIS SIBLINGS; 2 UNLCES HAVE ALSO PASSED AWAY FROM DM   Cardiomyopathy Mother    Cancer - Ovarian Mother    Ovarian cancer Mother    Heart disease Father    Hypertension Father    Diabetes Father    Diabetes Sister    Diabetes Brother    Colon cancer Paternal Uncle    Colon cancer Paternal Uncle    Colon polyps Neg Hx    Esophageal cancer Neg Hx    Rectal cancer Neg Hx    Stomach cancer Neg Hx     Past Surgical History:  Procedure Laterality Date   AMPUTATION Right 01/25/2021   Procedure: RIGHT BELOW KNEE AMPUTATION;  Surgeon: [name], MD;  Location: [facility];  Service: Orthopedics;  Laterality: Right;   CARDIAC CATHETERIZATION  12/19/10   DIFFUSE NONOBSTRUCTIVE CAD; NONISCHEMIC CARDIOMYOPATHY; LEFT VENTRICULAR ANGIOGRAM WAS PERFORMED SECONDARY TO  ELEVATED LEFT VENTRICULAR FILLING PRESSURES   COLONOSCOPY     ~ age 26-23   COLONOSCOPY W/ POLYPECTOMY     I & D EXTREMITY Bilateral 10/24/2019   Procedure: IRRIGATION AND DEBRIDEMENT BILATERAL EXTREMITY WOUND ON FOOT;  Surgeon: Cindra Presume, MD;  Location: [facility];  Service: Plastics;  Laterality: Bilateral;   ICD IMPLANT N/A 01/13/2022   Procedure: ICD IMPLANT;  Surgeon: Evans Lance, MD;  Location: [facility];  Service: Cardiovascular;  Laterality: N/A;   METATARSAL HEAD EXCISION Right  08/29/2018   Procedure: METATARSAL HEAD RESECTION;  Surgeon: Edrick Kins, DPM;  Location: [facility];  Service: Podiatry;  Laterality: Right;   METATARSAL OSTEOTOMY Right 08/29/2018   Procedure: SUB FIFTHE METATARSIA RIGHT FOOT;  Surgeon: Edrick Kins, DPM;  Location: [facility];  Service: Podiatry;  Laterality: Right;   MULTIPLE EXTRACTIONS WITH ALVEOLOPLASTY  01/27/2014   "all my teeth; 4 Quadrants of alveoloplasty   MULTIPLE EXTRACTIONS WITH ALVEOLOPLASTY N/A 01/27/2014   Procedure: EXTRACTION OF TEETH #'1, 2, 3, 4, 5, 6, 7, 8, 9, 10, 11, 12, 13, 14, 15, 16, 17, 20, 21, 22, 23, 24, 25, 26, 27, 28, 29, 31 and 32 WITH ALVEOLOPLASTY;  Surgeon: Lenn Cal, DDS;  Location: [facility];  Service: Oral Surgery;  Laterality: N/A;   ORIF FINGER / THUMB FRACTURE Right    POLYPECTOMY     RIGHT HEART CATH N/A 01/27/2021   Procedure: RIGHT HEART CATH;  Surgeon: Jolaine Artist, MD;  Location: [facility];  Service: Cardiovascular;  Laterality: N/A;   RIGHT/LEFT HEART CATH AND CORONARY ANGIOGRAPHY N/A 09/26/2017   Procedure: RIGHT/LEFT HEART CATH AND CORONARY ANGIOGRAPHY;  Surgeon: Jolaine Artist, MD;  Location: [facility];  Service: Cardiovascular;  Laterality: N/A;   RIGHT/LEFT HEART CATH AND CORONARY ANGIOGRAPHY N/A 04/12/2021   Procedure: RIGHT/LEFT HEART CATH AND CORONARY ANGIOGRAPHY;  Surgeon: Jolaine Artist, MD;  Location: [facility];  Service: Cardiovascular;  Laterality: N/A;   SKIN SPLIT GRAFT Bilateral 10/24/2019   Procedure: SKIN GRAFT SPLIT THICKNESS LEFT THIGH;  Surgeon: Cindra Presume, MD;  Location: [facility];  Service: Plastics;  Laterality: Bilateral;   WOUND DEBRIDEMENT Right 08/29/2018   Procedure: Debridement of ulcer on right  fifth metatarsal;  Surgeon: Edrick Kins, DPM;  Location: [facility];  Service: Podiatry;  Laterality: Right;   Social History   Occupational History   Occupation: Mortician  Tobacco Use   Smoking status: Never   Smokeless tobacco: Never   Vaping Use   Vaping Use: Never used  Substance and Sexual Activity   Alcohol use: No   Drug use: No   Sexual activity: Not on file

## 2022-05-02 ENCOUNTER — Other Ambulatory Visit (HOSPITAL_COMMUNITY): Payer: Self-pay | Admitting: Emergency Medicine

## 2022-05-02 ENCOUNTER — Other Ambulatory Visit: Payer: Self-pay | Admitting: Family Medicine

## 2022-05-02 ENCOUNTER — Other Ambulatory Visit (HOSPITAL_COMMUNITY): Payer: Self-pay | Admitting: Family Medicine

## 2022-05-02 NOTE — Progress Notes (Unsigned)
Paramedicine Encounter    Patient ID: Mitchell [name], male    DOB: Mar 21, 1971, 51 y.o.   MRN: [id number]   There were no vitals taken for this visit. Weight yesterday-not taken Last visit weight-274.8lb  ATF Mr. Bard A&O x 4, skin W&D w/ good color.  Pt denies chest pain or SOB.  Lung sounds clear and equal bilat.  No edema noted to his extremities.  Pt.'s weight is up 5lbs from last week.  Will contact [facility] for possible med changes.   Pt had agreed to be responsible for calling his his refills last visit.  He did not.  He had reconciled his meds himself prior to my visit and meds reviewed for accuracy w/o incident.  I personally called the refills in during this visit and he agreed to pick them up.  He currently has enough meds for the next 3 days.  He currently gets meds @ [facility] on [address] and I encouraged him to find a pharmacy closer to where he is now living.   Home visit complete  Patient Care Team: [name], [name], MD as PCP - General (Family Medicine) Bensimhon, Shaune Pascal, MD as PCP - Cardiology (Cardiology)  Patient Active Problem List   Diagnosis Date Noted   ICD (implantable cardioverter-defibrillator) in place 04/19/2022   Near syncope 12/08/2021   CKD (chronic kidney disease) stage 3, GFR 30-59 ml/min ([place]) 12/08/2021   PVD s/p R LE amputation  12/08/2021   Diabetic retinopathy associated with type 2 diabetes mellitus ([place]) 06/06/2021   CAD (coronary artery disease) with flat troponin  04/11/2021   S/P below knee amputation, right ([employer]) 02/07/2021   QT prolongation 07/30/2019   Acute on chronic combined systolic and diastolic CHF (congestive heart failure) ([place]) 07/29/2019   Stroke ([place]) 05/08/2017   Uncontrolled type 2 diabetes mellitus with hyperglycemia, with long-term current use of insulin ([place]) 35/00/9381   Chronic systolic heart failure ([place]) 10/02/2011   Erectile dysfunction 10/02/2011   Obstructive sleep apnea 10/11/2007   HLD (hyperlipidemia)  10/10/2007   HYPOKALEMIA 10/10/2007   Obesity, Class III, BMI 40-49.9 (morbid obesity) ([place]) 10/10/2007   Essential hypertension 10/10/2007   PREMATURE VENTRICULAR CONTRACTIONS 10/10/2007   COLONIC POLYPS, HX OF 10/10/2007    Current Outpatient Medications:    aspirin 81 MG EC tablet, Take 1 tablet (81 mg total) by mouth daily., Disp: 30 tablet, Rfl: 12   carvedilol (COREG) 6.25 MG tablet, Take 6.25 mg by mouth 2 (two) times daily., Disp: , Rfl:    Cholecalciferol (VITAMIN D-3 PO), Take 1 capsule by mouth daily., Disp: , Rfl:    clopidogrel (PLAVIX) 75 MG tablet, Take 1 tablet by mouth once daily, Disp: 90 tablet, Rfl: 3   doxycycline (VIBRAMYCIN) 100 MG capsule, Take 1 capsule (100 mg total) by mouth 2 (two) times daily. One po bid x 7 days, Disp: 14 capsule, Rfl: 0   Insulin Glargine (BASAGLAR KWIKPEN) 100 UNIT/ML, Inject 21 Units into the skin 2 (two) times daily., Disp: , Rfl:    methocarbamol (ROBAXIN) 500 MG tablet, Take 1 tablet (500 mg total) by mouth every 6 (six) hours as needed for muscle spasms., Disp: 30 tablet, Rfl: 0   pantoprazole (PROTONIX) 40 MG tablet, Take 1 tablet (40 mg total) by mouth daily as needed (acid reflux)., Disp: 30 tablet, Rfl: 6   potassium chloride SA (KLOR-CON M) 20 MEQ tablet, Take 20 mEq by mouth daily., Disp: , Rfl:    rosuvastatin (CRESTOR) 40 MG tablet, Take  1 tablet by mouth once daily, Disp: 90 tablet, Rfl: 3   sacubitril-valsartan (ENTRESTO) 97-103 MG, Take 1 tablet by mouth 2 (two) times daily., Disp: 60 tablet, Rfl: 11   spironolactone (ALDACTONE) 25 MG tablet, Take 1 tablet by mouth once daily, Disp: 90 tablet, Rfl: 3   torsemide (DEMADEX) 20 MG tablet, Take 2 tablets (40 mg total) by mouth daily., Disp: 180 tablet, Rfl: 11   Accu-Chek Softclix Lancets lancets, Use to test blood sugars up to 4 times daily as needed. (Patient not taking: Reported on 05/02/2022), Disp: 100 each, Rfl: 0   amitriptyline (ELAVIL) 10 MG tablet, TAKE 1 TABLET BY MOUTH AT  BEDTIME (Patient not taking: Reported on 04/25/2022), Disp: 30 tablet, Rfl: 0   Ascorbic Acid (VITAMIN C PO), Take 1 tablet by mouth daily. (Patient not taking: Reported on 04/25/2022), Disp: , Rfl:    Blood Glucose Monitoring Suppl (BLOOD GLUCOSE MONITOR SYSTEM) w/Device KIT, Use to test blood sugar up to 4 times daily as needed. (Patient not taking: Reported on 04/25/2022), Disp: 1 kit, Rfl: 0   glucose blood (ACCU-CHEK GUIDE) test strip, Use to test blood sugars up to 4 times daily as needed. (Patient not taking: Reported on 04/25/2022), Disp: 100 each, Rfl: 0   oxyCODONE (OXY IR/ROXICODONE) 5 MG immediate release tablet, Take 0.5-1 tablets (2.5-5 mg total) by mouth every 6 (six) hours as needed for severe pain. (Patient not taking: Reported on 05/02/2022), Disp: 30 tablet, Rfl: 0 Allergies  Allergen Reactions   Farxiga [Dapagliflozin] Other (See Comments)    Patient reports loss of consciousness   Nsaids Anaphylaxis and Rash    Able to tolerate aspirin    Trulicity [Dulaglutide] Nausea And Vomiting      Social History   Socioeconomic History   Marital status: Married    Spouse name: Not on file   Number of children: 5   Years of education: Not on file   Highest education level: Not on file  Occupational History   Occupation: Mortician  Tobacco Use   Smoking status: Never   Smokeless tobacco: Never  Vaping Use   Vaping Use: Never used  Substance and Sexual Activity   Alcohol use: No   Drug use: No   Sexual activity: Not on file  Other Topics Concern   Not on file  Social History Narrative   MARRIED, LIVES [place]; GREW UP IN [place] AND USED TO BE A COOK; HE ENJOYS COOKING AND ENJOYS EATING A LOT OF PORK AND SALT.   Social Determinants of Health   Financial Resource Strain: Medium Risk (04/13/2021)   Overall Financial Resource Strain (CARDIA)    Difficulty of Paying Living Expenses: Somewhat hard  Food Insecurity: No Food Insecurity (04/13/2021)    Hunger Vital Sign    Worried About Running Out of Food in the Last Year: Never true    Ran Out of Food in the Last Year: Never true  Transportation Needs: No Transportation Needs (04/13/2021)   PRAPARE - Hydrologist (Medical): No    Lack of Transportation (Non-Medical): No  Physical Activity: Not on file  Stress: Not on file  Social Connections: Not on file  Intimate Partner Violence: Not on file    Physical Exam      Future Appointments  Date Time Provider [place]  05/03/2022  3:30 PM Philis Pique [facility]  05/09/2022  4:15 PM Percival Spanish, PT [facility]  05/15/2022  3:30 PM Artist Pais, PTA [facility]  05/18/2022  3:30 PM Artist Pais, PTA [facility]  05/22/2022  3:30 PM Percival Spanish, PT [facility]  05/29/2022  9:15 AM [name], MD [facility] None  07/05/2022  2:40 PM Bensimhon, Shaune Pascal, MD [facility] None  07/17/2022  7:05 AM [facility] DEVICE REMOTES [facility]  10/16/2022  7:05 AM [facility] DEVICE REMOTES [facility]  01/15/2023  7:05 AM [facility] DEVICE REMOTES [facility]  04/16/2023  7:05 AM [facility] DEVICE REMOTES [facility]       [name], [place] Par[facility]  05/02/22

## 2022-05-03 ENCOUNTER — Ambulatory Visit: Payer: No Typology Code available for payment source

## 2022-05-03 ENCOUNTER — Telehealth (HOSPITAL_COMMUNITY): Payer: Self-pay | Admitting: Emergency Medicine

## 2022-05-03 DIAGNOSIS — M25612 Stiffness of left shoulder, not elsewhere classified: Secondary | ICD-10-CM

## 2022-05-03 DIAGNOSIS — M25511 Pain in right shoulder: Secondary | ICD-10-CM

## 2022-05-03 DIAGNOSIS — M25611 Stiffness of right shoulder, not elsewhere classified: Secondary | ICD-10-CM

## 2022-05-03 DIAGNOSIS — M6281 Muscle weakness (generalized): Secondary | ICD-10-CM

## 2022-05-03 DIAGNOSIS — M542 Cervicalgia: Secondary | ICD-10-CM | POA: Diagnosis not present

## 2022-05-03 DIAGNOSIS — M5413 Radiculopathy, cervicothoracic region: Secondary | ICD-10-CM

## 2022-05-03 DIAGNOSIS — R293 Abnormal posture: Secondary | ICD-10-CM

## 2022-05-03 DIAGNOSIS — R252 Cramp and spasm: Secondary | ICD-10-CM

## 2022-05-03 DIAGNOSIS — M5459 Other low back pain: Secondary | ICD-10-CM

## 2022-05-03 NOTE — Telephone Encounter (Signed)
Called and spoke with Mitchell [name] and advised him of the following med changes Take an extra 40mg . Torsemide x 3 days and an extra 57mEq Potassium x 3 days then resume regular regimen.  He verbalized that he understood instructions.    [name], [place] 05/03/2022

## 2022-05-03 NOTE — Telephone Encounter (Signed)
Dr Lorelei Pont,  Pt is requesting Carvedilol refill but I do not see that we have refilled this since 2022?  Please review and advise?
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
## 2022-05-04 ENCOUNTER — Telehealth (HOSPITAL_COMMUNITY): Payer: Self-pay | Admitting: Emergency Medicine

## 2022-05-04 ENCOUNTER — Telehealth: Payer: Self-pay | Admitting: Orthopedic Surgery

## 2022-05-04 NOTE — Telephone Encounter (Signed)
I called pt and notified of message below. Verbalized understanding and will call with questions.

## 2022-05-04 NOTE — Telephone Encounter (Signed)
Pt completed 7 day doxy rx that was given on 04/28/22. He was in the office to see Dr. Sharol Given earlier this week and forgot to ask if he should be taking again. I advised the pt that per the note there were no s/s of infection or cellulitis, drainage was clear he is not having any s/s of illness and that he was to apply dry dressing compression and avoid wearing prosthetic until it could be modified of which he was given an rx for [facility] to do this. Advised I would sent to NP to review and advise if there was anything additional that we needed to do but she would not be in the office until tomorrow

## 2022-05-04 NOTE — Telephone Encounter (Signed)
No, no refill.  If things worsen in the next few days please return sooner than the 30th when he is scheduled

## 2022-05-04 NOTE — Telephone Encounter (Signed)
Patient wants some to call him about antiboic med

## 2022-05-04 NOTE — Telephone Encounter (Signed)
Mitchell [name] called me to confirm his med change.  Reviewed an additional 40mg . Torsemide and 1 xtra Potassium 20 mEq x 3 days then back to 40mg  Torsemide and 104mEq Potassium. He advises that he understands these instructions.

## 2022-05-05 ENCOUNTER — Telehealth (HOSPITAL_COMMUNITY): Payer: Self-pay | Admitting: *Deleted

## 2022-05-05 MED ORDER — POTASSIUM CHLORIDE CRYS ER 20 MEQ PO TBCR: 20.0000 meq | EXTENDED_RELEASE_TABLET | Freq: Every day | ORAL | 2 refills | Status: DC

## 2022-05-05 NOTE — Telephone Encounter (Signed)
Dede called to clarify pt's KCL dose, she states he has been on 20 meq daily for a while and she was not aware of any change, however his med list now shows 20 meq QOD and that is what the pharmacy has and told pt it is to early to refill, he is out.  Per chart review pt has been on 20 meq QD, however refill sent to pharmacy 10/4 for 20 mq QOD was per old/previous order. Advised there was no change and if pt has been taking 20 meq QD that is what needs to be continued, med list updated, new rx sent to pharmacy, [place] will advise pt

## 2022-05-09 ENCOUNTER — Ambulatory Visit: Payer: No Typology Code available for payment source

## 2022-05-11 ENCOUNTER — Other Ambulatory Visit (HOSPITAL_COMMUNITY): Payer: Self-pay | Admitting: Emergency Medicine

## 2022-05-12 NOTE — Progress Notes (Addendum)
Paramedicine Encounter    Patient ID: Mitchell [name], male    DOB: May 28, 1971, 51 y.o.   MRN: [id number]   BP (!) 140/80 (BP Location: Left Arm, Patient Position: Sitting, Cuff Size: Normal)   Pulse 80   Resp 16   Wt 276 lb 8 oz (125.4 kg)   SpO2 97%   BMI 38.56 kg/m  Weight yesterday-not taken   Last visit weight-279lb  ATF Mitchell [name] A&O x 4, skin W/D w/ good color.  He denies chest pain or SOB.  Lung sounds clear and equal bilat.  He has been compliant with all his meds.  He also does his own pill box without issue.  I have had all his prescriptions moved from [facility] on [address]  to [facility] in [place] ([address]  He has recently moved to [place] is only a couple miles from his home.  He did not weigh today.  Mitchell [name] is to weigh tomorrow and text me weight.  He has been dealing with a wound on is stump and it has been uncomfortable for him to stand.  He has been using his wheelchair to get around as to not irritate the wound.  It is quite difficult to do visits with Mitchell [name] as he does not get home from work till 5:00 or a little after.  I did one visit with him @ is job at [employer] during his lunch break.  He says this isn't always possible and is nervous about "getting in trouble with his employer."  I will continue to try and accommodate his schedule as best I can. Home visit complete.    [name], [place] 05/12/2022   Patient Care Team: [name], MD as PCP - General (Family Medicine) [name] Shaune Pascal, MD as PCP - Cardiology (Cardiology)  Patient Active Problem List   Diagnosis Date Noted   ICD (implantable cardioverter-defibrillator) in place 04/19/2022   Near syncope 12/08/2021   CKD (chronic kidney disease) stage 3, GFR 30-59 ml/min ([place]) 12/08/2021   PVD s/p R LE amputation  12/08/2021   Diabetic retinopathy associated with type 2 diabetes mellitus ([place]) 06/06/2021   CAD  (coronary artery disease) with flat troponin  04/11/2021   S/P below knee amputation, right ([place]) 02/07/2021   QT prolongation 07/30/2019   Acute on chronic combined systolic and diastolic CHF (congestive heart failure) ([place]) 07/29/2019   Stroke ([place]) 05/08/2017   Uncontrolled type 2 diabetes mellitus with hyperglycemia, with long-term current use of insulin ([place]) 40/98/1191   Chronic systolic heart failure ([place]) 10/02/2011   Erectile dysfunction 10/02/2011   Obstructive sleep apnea 10/11/2007   HLD (hyperlipidemia) 10/10/2007   HYPOKALEMIA 10/10/2007   Obesity, Class III, BMI 40-49.9 (morbid obesity) ([place]) 10/10/2007   Essential hypertension 10/10/2007   PREMATURE VENTRICULAR CONTRACTIONS 10/10/2007   COLONIC POLYPS, HX OF 10/10/2007    Current Outpatient Medications:    Accu-Chek Softclix Lancets lancets, Use to test blood sugars up to 4 times daily as needed. (Patient not taking: Reported on 05/02/2022), Disp: 100 each, Rfl: 0   amitriptyline (ELAVIL) 10 MG tablet, TAKE 1 TABLET BY MOUTH AT BEDTIME (Patient not taking: Reported on 04/25/2022), Disp: 30 tablet, Rfl: 0   Ascorbic Acid (VITAMIN C PO), Take 1 tablet by mouth daily. (Patient not taking: Reported on 04/25/2022), Disp: , Rfl:    aspirin 81 MG EC tablet, Take 1 tablet (81 mg total) by mouth daily., Disp: 30 tablet, Rfl:  12   Blood Glucose Monitoring Suppl (BLOOD GLUCOSE MONITOR SYSTEM) w/Device KIT, Use to test blood sugar up to 4 times daily as needed. (Patient not taking: Reported on 04/25/2022), Disp: 1 kit, Rfl: 0   carvedilol (COREG) 6.25 MG tablet, TAKE 1 TABLET BY MOUTH TWICE DAILY WITH A MEAL, Disp: 180 tablet, Rfl: 3   Cholecalciferol (VITAMIN D-3 PO), Take 1 capsule by mouth daily., Disp: , Rfl:    clopidogrel (PLAVIX) 75 MG tablet, Take 1 tablet by mouth once daily, Disp: 90 tablet, Rfl: 3   doxycycline (VIBRAMYCIN) 100 MG capsule, Take 1 capsule (100 mg total) by mouth 2 (two) times daily. One po bid x 7 days, Disp: 14  capsule, Rfl: 0   glucose blood (ACCU-CHEK GUIDE) test strip, Use to test blood sugars up to 4 times daily as needed. (Patient not taking: Reported on 04/25/2022), Disp: 100 each, Rfl: 0   Insulin Glargine (BASAGLAR KWIKPEN) 100 UNIT/ML, Inject 21 Units into the skin 2 (two) times daily., Disp: , Rfl:    methocarbamol (ROBAXIN) 500 MG tablet, Take 1 tablet (500 mg total) by mouth every 6 (six) hours as needed for muscle spasms., Disp: 30 tablet, Rfl: 0   oxyCODONE (OXY IR/ROXICODONE) 5 MG immediate release tablet, Take 0.5-1 tablets (2.5-5 mg total) by mouth every 6 (six) hours as needed for severe pain. (Patient not taking: Reported on 05/02/2022), Disp: 30 tablet, Rfl: 0   pantoprazole (PROTONIX) 40 MG tablet, Take 1 tablet (40 mg total) by mouth daily as needed (acid reflux)., Disp: 30 tablet, Rfl: 6   potassium chloride SA (KLOR-CON M) 20 MEQ tablet, Take 1 tablet (20 mEq total) by mouth daily., Disp: 90 tablet, Rfl: 2   rosuvastatin (CRESTOR) 40 MG tablet, Take 1 tablet by mouth once daily, Disp: 90 tablet, Rfl: 3   sacubitril-valsartan (ENTRESTO) 97-103 MG, Take 1 tablet by mouth 2 (two) times daily., Disp: 60 tablet, Rfl: 11   spironolactone (ALDACTONE) 25 MG tablet, Take 1 tablet by mouth once daily, Disp: 90 tablet, Rfl: 3   torsemide (DEMADEX) 20 MG tablet, Take 2 tablets (40 mg total) by mouth daily., Disp: 180 tablet, Rfl: 11 Allergies  Allergen Reactions   Farxiga [Dapagliflozin] Other (See Comments)    Patient reports loss of consciousness   Nsaids Anaphylaxis and Rash    Able to tolerate aspirin    Trulicity [Dulaglutide] Nausea And Vomiting      Social History   Socioeconomic History   Marital status: Married    Spouse name: Not on file   Number of children: 5   Years of education: Not on file   Highest education level: Not on file  Occupational History   Occupation: Mortician  Tobacco Use   Smoking status: Never   Smokeless tobacco: Never  Vaping Use   Vaping Use:  Never used  Substance and Sexual Activity   Alcohol use: No   Drug use: No   Sexual activity: Not on file  Other Topics Concern   Not on file  Social History Narrative   MARRIED, LIVES [place]; GREW UP IN [place] AND USED TO BE A COOK; HE ENJOYS COOKING AND ENJOYS EATING A LOT OF PORK AND SALT.   Social Determinants of Health   Financial Resource Strain: Medium Risk (04/13/2021)   Overall Financial Resource Strain (CARDIA)    Difficulty of Paying Living Expenses: Somewhat hard  Food Insecurity: No Food Insecurity (04/13/2021)   Hunger Vital Sign  Worried About Charity fundraiser in the Last Year: Never true    Wise in the Last Year: Never true  Transportation Needs: No Transportation Needs (04/13/2021)   PRAPARE - Hydrologist (Medical): No    Lack of Transportation (Non-Medical): No  Physical Activity: Not on file  Stress: Not on file  Social Connections: Not on file  Intimate Partner Violence: Not on file    Physical Exam      Future Appointments  Date Time Provider [place]  05/15/2022  3:30 PM Artist Pais, PTA [facility]  05/18/2022  3:30 PM Artist Pais, PTA [facility]  05/22/2022  3:30 PM Percival Spanish, PT [facility]  05/29/2022  9:15 AM [name], MD [facility] None  07/05/2022  2:40 PM Bensimhon, Shaune Pascal, MD [facility] None  07/17/2022  7:05 AM [facility] DEVICE REMOTES [facility]  10/16/2022  7:05 AM [facility] DEVICE REMOTES [facility]  01/15/2023  7:05 AM [facility] DEVICE REMOTES [facility]  04/16/2023  7:05 AM [facility] DEVICE REMOTES [facility]       [name], [place] Paramed[facility]  05/12/22

## 2022-05-15 ENCOUNTER — Ambulatory Visit: Payer: No Typology Code available for payment source

## 2022-05-15 DIAGNOSIS — M6281 Muscle weakness (generalized): Secondary | ICD-10-CM

## 2022-05-15 DIAGNOSIS — M542 Cervicalgia: Secondary | ICD-10-CM

## 2022-05-15 DIAGNOSIS — M25612 Stiffness of left shoulder, not elsewhere classified: Secondary | ICD-10-CM

## 2022-05-15 DIAGNOSIS — R293 Abnormal posture: Secondary | ICD-10-CM

## 2022-05-15 DIAGNOSIS — M5413 Radiculopathy, cervicothoracic region: Secondary | ICD-10-CM

## 2022-05-15 DIAGNOSIS — R252 Cramp and spasm: Secondary | ICD-10-CM

## 2022-05-15 DIAGNOSIS — M25511 Pain in right shoulder: Secondary | ICD-10-CM

## 2022-05-15 DIAGNOSIS — M25611 Stiffness of right shoulder, not elsewhere classified: Secondary | ICD-10-CM

## 2022-05-15 DIAGNOSIS — M5459 Other low back pain: Secondary | ICD-10-CM
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
## 2022-05-18 ENCOUNTER — Ambulatory Visit: Payer: No Typology Code available for payment source

## 2022-05-18 DIAGNOSIS — M25611 Stiffness of right shoulder, not elsewhere classified: Secondary | ICD-10-CM

## 2022-05-18 DIAGNOSIS — M25511 Pain in right shoulder: Secondary | ICD-10-CM

## 2022-05-18 DIAGNOSIS — M542 Cervicalgia: Secondary | ICD-10-CM | POA: Diagnosis not present

## 2022-05-18 DIAGNOSIS — R252 Cramp and spasm: Secondary | ICD-10-CM

## 2022-05-18 DIAGNOSIS — M6281 Muscle weakness (generalized): Secondary | ICD-10-CM

## 2022-05-18 DIAGNOSIS — M5459 Other low back pain: Secondary | ICD-10-CM

## 2022-05-18 DIAGNOSIS — M25612 Stiffness of left shoulder, not elsewhere classified: Secondary | ICD-10-CM

## 2022-05-18 DIAGNOSIS — M5413 Radiculopathy, cervicothoracic region: Secondary | ICD-10-CM

## 2022-05-18 DIAGNOSIS — R293 Abnormal posture: Secondary | ICD-10-CM
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
## 2022-05-22 ENCOUNTER — Ambulatory Visit: Payer: No Typology Code available for payment source | Admitting: Physical Therapy

## 2022-05-22 ENCOUNTER — Encounter: Payer: Self-pay | Admitting: Physical Therapy

## 2022-05-22 DIAGNOSIS — M6281 Muscle weakness (generalized): Secondary | ICD-10-CM

## 2022-05-22 DIAGNOSIS — M5413 Radiculopathy, cervicothoracic region: Secondary | ICD-10-CM

## 2022-05-22 DIAGNOSIS — M25612 Stiffness of left shoulder, not elsewhere classified: Secondary | ICD-10-CM

## 2022-05-22 DIAGNOSIS — M5459 Other low back pain: Secondary | ICD-10-CM

## 2022-05-22 DIAGNOSIS — M25611 Stiffness of right shoulder, not elsewhere classified: Secondary | ICD-10-CM

## 2022-05-22 DIAGNOSIS — M542 Cervicalgia: Secondary | ICD-10-CM

## 2022-05-22 DIAGNOSIS — R293 Abnormal posture: Secondary | ICD-10-CM

## 2022-05-22 DIAGNOSIS — M25511 Pain in right shoulder: Secondary | ICD-10-CM

## 2022-05-22 DIAGNOSIS — R252 Cramp and spasm: Secondary | ICD-10-CM
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
## 2022-05-23 ENCOUNTER — Telehealth (HOSPITAL_COMMUNITY): Payer: Self-pay | Admitting: Emergency Medicine

## 2022-05-23 NOTE — Telephone Encounter (Signed)
Mitchell [name] returned phone call from my earlier text.  Scheduled home visit for 10/26 @ 05:00.    [name], [place] 05/23/2022

## 2022-05-23 NOTE — Telephone Encounter (Signed)
Text:  "call me sometime today when you're free.  I need to schedule a visit for you."

## 2022-05-25 ENCOUNTER — Other Ambulatory Visit (HOSPITAL_COMMUNITY): Payer: Self-pay | Admitting: Emergency Medicine

## 2022-05-25 NOTE — Progress Notes (Signed)
Paramedicine Encounter    Patient ID: Mitchell [name], male    DOB: 1970/12/27, 51 y.o.   MRN: [id number]   There were no vitals taken for this visit. Weight yesterday-not taken Last visit weight-276lb  Home visit scheduled for 5:00.  He arrived at 5:30.  He has done well with his meds and reconciles his pill box without issue.  He denies chest pain or SOB.  Lung sounds clear and equal bilat.  No edema noted.  During this visit [name] expressed he and his wife were having issues and he felt like he needed to start go to counseling.  He denies suicidal or homicidal ideations.  I suggested he call [facility] and enquire about counselors covered by his insurance and he advised he would do same.  Home visit complete.    Mitchell [name], [place] 05/26/2022  Patient Care Team: [name], MD as PCP - General (Family Medicine) [name] Shaune Pascal, MD as PCP - Cardiology (Cardiology)  Patient Active Problem List   Diagnosis Date Noted   ICD (implantable cardioverter-defibrillator) in place 04/19/2022   Near syncope 12/08/2021   CKD (chronic kidney disease) stage 3, GFR 30-59 ml/min ([place]) 12/08/2021   PVD s/p R LE amputation  12/08/2021   Diabetic retinopathy associated with type 2 diabetes mellitus ([place]) 06/06/2021   CAD (coronary artery disease) with flat troponin  04/11/2021   S/P below knee amputation, right ([place]) 02/07/2021   QT prolongation 07/30/2019   Acute on chronic combined systolic and diastolic CHF (congestive heart failure) ([place]) 07/29/2019   Stroke ([place]) 05/08/2017   Uncontrolled type 2 diabetes mellitus with hyperglycemia, with long-term current use of insulin ([place]) 41/28/7867   Chronic systolic heart failure ([place]) 10/02/2011   Erectile dysfunction 10/02/2011   Obstructive sleep apnea 10/11/2007   HLD (hyperlipidemia) 10/10/2007   HYPOKALEMIA 10/10/2007   Obesity, Class III, BMI 40-49.9 (morbid obesity) ([place]) 10/10/2007   Essential hypertension  10/10/2007   PREMATURE VENTRICULAR CONTRACTIONS 10/10/2007   COLONIC POLYPS, HX OF 10/10/2007    Current Outpatient Medications:    Accu-Chek Softclix Lancets lancets, Use to test blood sugars up to 4 times daily as needed. (Patient not taking: Reported on 05/02/2022), Disp: 100 each, Rfl: 0   amitriptyline (ELAVIL) 10 MG tablet, TAKE 1 TABLET BY MOUTH AT BEDTIME (Patient not taking: Reported on 04/25/2022), Disp: 30 tablet, Rfl: 0   Ascorbic Acid (VITAMIN C PO), Take 1 tablet by mouth daily. (Patient not taking: Reported on 04/25/2022), Disp: , Rfl:    aspirin 81 MG EC tablet, Take 1 tablet (81 mg total) by mouth daily., Disp: 30 tablet, Rfl: 12   Blood Glucose Monitoring Suppl (BLOOD GLUCOSE MONITOR SYSTEM) w/Device KIT, Use to test blood sugar up to 4 times daily as needed. (Patient not taking: Reported on 04/25/2022), Disp: 1 kit, Rfl: 0   carvedilol (COREG) 6.25 MG tablet, TAKE 1 TABLET BY MOUTH TWICE DAILY WITH A MEAL, Disp: 180 tablet, Rfl: 3   Cholecalciferol (VITAMIN D-3 PO), Take 1 capsule by mouth daily., Disp: , Rfl:    clopidogrel (PLAVIX) 75 MG tablet, Take 1 tablet by mouth once daily, Disp: 90 tablet, Rfl: 3   doxycycline (VIBRAMYCIN) 100 MG capsule, Take 1 capsule (100 mg total) by mouth 2 (two) times daily. One po bid x 7 days, Disp: 14 capsule, Rfl: 0   glucose blood (ACCU-CHEK GUIDE) test strip, Use to test blood sugars up to 4 times daily as needed. (Patient not taking: Reported on 04/25/2022),  Disp: 100 each, Rfl: 0   Insulin Glargine (BASAGLAR KWIKPEN) 100 UNIT/ML, Inject 21 Units into the skin 2 (two) times daily., Disp: , Rfl:    methocarbamol (ROBAXIN) 500 MG tablet, Take 1 tablet (500 mg total) by mouth every 6 (six) hours as needed for muscle spasms., Disp: 30 tablet, Rfl: 0   oxyCODONE (OXY IR/ROXICODONE) 5 MG immediate release tablet, Take 0.5-1 tablets (2.5-5 mg total) by mouth every 6 (six) hours as needed for severe pain. (Patient not taking: Reported on 05/02/2022),  Disp: 30 tablet, Rfl: 0   pantoprazole (PROTONIX) 40 MG tablet, Take 1 tablet (40 mg total) by mouth daily as needed (acid reflux)., Disp: 30 tablet, Rfl: 6   potassium chloride SA (KLOR-CON M) 20 MEQ tablet, Take 1 tablet (20 mEq total) by mouth daily., Disp: 90 tablet, Rfl: 2   rosuvastatin (CRESTOR) 40 MG tablet, Take 1 tablet by mouth once daily, Disp: 90 tablet, Rfl: 3   sacubitril-valsartan (ENTRESTO) 97-103 MG, Take 1 tablet by mouth 2 (two) times daily., Disp: 60 tablet, Rfl: 11   spironolactone (ALDACTONE) 25 MG tablet, Take 1 tablet by mouth once daily, Disp: 90 tablet, Rfl: 3   torsemide (DEMADEX) 20 MG tablet, Take 2 tablets (40 mg total) by mouth daily., Disp: 180 tablet, Rfl: 11 Allergies  Allergen Reactions   Farxiga [Dapagliflozin] Other (See Comments)    Patient reports loss of consciousness   Nsaids Anaphylaxis and Rash    Able to tolerate aspirin    Trulicity [Dulaglutide] Nausea And Vomiting      Social History   Socioeconomic History   Marital status: Married    Spouse name: Not on file   Number of children: 5   Years of education: Not on file   Highest education level: Not on file  Occupational History   Occupation: Mortician  Tobacco Use   Smoking status: Never   Smokeless tobacco: Never  Vaping Use   Vaping Use: Never used  Substance and Sexual Activity   Alcohol use: No   Drug use: No   Sexual activity: Not on file  Other Topics Concern   Not on file  Social History Narrative   MARRIED, LIVES [place]; GREW UP IN [place] AND USED TO BE A COOK; HE ENJOYS COOKING AND ENJOYS EATING A LOT OF PORK AND SALT.   Social Determinants of Health   Financial Resource Strain: Medium Risk (04/13/2021)   Overall Financial Resource Strain (CARDIA)    Difficulty of Paying Living Expenses: Somewhat hard  Food Insecurity: No Food Insecurity (04/13/2021)   Hunger Vital Sign    Worried About Running Out of Food in the Last Year: Never true     Ran Out of Food in the Last Year: Never true  Transportation Needs: No Transportation Needs (04/13/2021)   PRAPARE - Hydrologist (Medical): No    Lack of Transportation (Non-Medical): No  Physical Activity: Not on file  Stress: Not on file  Social Connections: Not on file  Intimate Partner Violence: Not on file    Physical Exam      Future Appointments  Date Time Provider [place]  05/29/2022  9:15 AM [name], MD [facility] None  07/05/2022  2:40 PM Bensimhon, Shaune Pascal, MD [facility] None  07/17/2022  7:05 AM [facility] DEVICE REMOTES [facility]  10/16/2022  7:05 AM [facility] DEVICE REMOTES [facility]  01/15/2023  7:05 AM [facility] DEVICE REMOTES [facility]  04/16/2023  7:05 AM [facility] DEVICE REMOTES [facility]       Mitchell [name], [place] Paramedic  05/25/22

## 2022-05-29 ENCOUNTER — Ambulatory Visit: Payer: No Typology Code available for payment source | Admitting: Orthopedic Surgery

## 2022-06-12 ENCOUNTER — Encounter: Payer: Self-pay | Admitting: Orthopedic Surgery

## 2022-06-12 ENCOUNTER — Encounter (HOSPITAL_BASED_OUTPATIENT_CLINIC_OR_DEPARTMENT_OTHER): Payer: Self-pay

## 2022-06-12 ENCOUNTER — Other Ambulatory Visit: Payer: Self-pay

## 2022-06-12 ENCOUNTER — Ambulatory Visit (INDEPENDENT_AMBULATORY_CARE_PROVIDER_SITE_OTHER): Payer: No Typology Code available for payment source | Admitting: Family Medicine

## 2022-06-12 ENCOUNTER — Ambulatory Visit (INDEPENDENT_AMBULATORY_CARE_PROVIDER_SITE_OTHER): Payer: No Typology Code available for payment source | Admitting: Orthopedic Surgery

## 2022-06-12 VITALS — BP 127/85 | HR 96 | Temp 98.6°F | Resp 12

## 2022-06-12 DIAGNOSIS — Z89511 Acquired absence of right leg below knee: Secondary | ICD-10-CM | POA: Diagnosis not present

## 2022-06-12 DIAGNOSIS — Z79899 Other long term (current) drug therapy: Secondary | ICD-10-CM | POA: Insufficient documentation

## 2022-06-12 DIAGNOSIS — M7989 Other specified soft tissue disorders: Secondary | ICD-10-CM | POA: Insufficient documentation

## 2022-06-12 DIAGNOSIS — Z794 Long term (current) use of insulin: Secondary | ICD-10-CM | POA: Insufficient documentation

## 2022-06-12 DIAGNOSIS — N189 Chronic kidney disease, unspecified: Secondary | ICD-10-CM | POA: Diagnosis not present

## 2022-06-12 DIAGNOSIS — S61219A Laceration without foreign body of unspecified finger without damage to nail, initial encounter: Secondary | ICD-10-CM

## 2022-06-12 DIAGNOSIS — W231XXA Caught, crushed, jammed, or pinched between stationary objects, initial encounter: Secondary | ICD-10-CM | POA: Diagnosis not present

## 2022-06-12 DIAGNOSIS — S61212A Laceration without foreign body of right middle finger without damage to nail, initial encounter: Secondary | ICD-10-CM | POA: Insufficient documentation

## 2022-06-12 DIAGNOSIS — E1122 Type 2 diabetes mellitus with diabetic chronic kidney disease: Secondary | ICD-10-CM | POA: Diagnosis not present

## 2022-06-12 DIAGNOSIS — E1165 Type 2 diabetes mellitus with hyperglycemia: Secondary | ICD-10-CM | POA: Insufficient documentation

## 2022-06-12 DIAGNOSIS — Z7902 Long term (current) use of antithrombotics/antiplatelets: Secondary | ICD-10-CM | POA: Diagnosis not present

## 2022-06-12 DIAGNOSIS — Y990 Civilian activity done for income or pay: Secondary | ICD-10-CM | POA: Insufficient documentation

## 2022-06-12 DIAGNOSIS — Z7982 Long term (current) use of aspirin: Secondary | ICD-10-CM | POA: Diagnosis not present

## 2022-06-12 DIAGNOSIS — S6991XA Unspecified injury of right wrist, hand and finger(s), initial encounter: Secondary | ICD-10-CM | POA: Diagnosis present

## 2022-06-12 LAB — BASIC METABOLIC PANEL
Anion gap: 11 (ref 5–15)
BUN: 22 mg/dL — ABNORMAL HIGH (ref 6–20)
CO2: 26 mmol/L (ref 22–32)
Calcium: 9.6 mg/dL (ref 8.9–10.3)
Chloride: 103 mmol/L (ref 98–111)
Creatinine, Ser: 1.65 mg/dL — ABNORMAL HIGH (ref 0.61–1.24)
GFR, Estimated: 49 mL/min — ABNORMAL LOW (ref >60)
Glucose, Bld: 321 mg/dL — ABNORMAL HIGH (ref 70–99)
Potassium: 3.7 mmol/L (ref 3.5–5.1)
Sodium: 140 mmol/L (ref 135–145)

## 2022-06-12 LAB — CBC
HCT: 36.2 % — ABNORMAL LOW (ref 39.0–52.0)
Hemoglobin: 12 g/dL — ABNORMAL LOW (ref 13.0–17.0)
MCH: 29.2 pg (ref 26.0–34.0)
MCHC: 33.1 g/dL (ref 30.0–36.0)
MCV: 88.1 fL (ref 80.0–100.0)
Platelets: 120 10*3/uL — ABNORMAL LOW (ref 150–400)
RBC: 4.11 MIL/uL — ABNORMAL LOW (ref 4.22–5.81)
RDW: 12.9 % (ref 11.5–15.5)
WBC: 8.9 10*3/uL (ref 4.0–10.5)
nRBC: 0 % (ref 0.0–0.2)

## 2022-06-12 NOTE — Progress Notes (Signed)
Office Visit Note   Patient: Mitchell [name]           Date of Birth: 02-Oct-1970           MRN: [id number] Visit Date: 06/12/2022              Requested by: Pearline Cables, MD [address] [place],  [place] [address] PCP: Pearline Cables, MD  Chief Complaint  Patient presents with   Right Leg - Follow-up    HX 01/25/2022 right BKA      HPI: Patient is a 51 year old gentleman who is 4 and half months status post right transtibial amputation.  Patient states that recently has been having some increased swelling he is wearing a gray tubular shrinker.  Patient also sustained an injury to his finger that is being treated through another office.  Assessment & Plan: Visit Diagnoses:  1. S/P below knee amputation, right (HCC)     Plan: Recommend the patient use the black stump shrinker with a great tube on top folded over.  Patient will return to [facility] for adjustment of the socket.  Follow-Up Instructions: Return in about 4 weeks (around 07/10/2022).   Ortho Exam  Patient is alert, oriented, no adenopathy, well-dressed, normal affect, normal respiratory effort. Examination patient has increased swelling of the leg there is no redness no cellulitis no ulcers no drainage there is good consolidation of the residual limb.  Imaging: No results found. No images are attached to the encounter.  Labs: Lab Results  Component Value Date   HGBA1C 7.8 (A) 08/19/2021   HGBA1C 7.9 (A) 06/08/2021   HGBA1C 7.8 (H) 04/11/2021   ESRSEDRATE 20 (H) 03/08/2022   ESRSEDRATE 81 (H) 10/21/2019   ESRSEDRATE 25 (H) 06/17/2018   CRP 10.3 (H) 01/28/2021   CRP 12.8 (H) 01/27/2021   CRP 21.1 (H) 01/26/2021   REPTSTATUS 01/25/2021 FINAL 01/24/2021   GRAMSTAIN  08/29/2018    RARE WBC PRESENT, PREDOMINANTLY PMN NO ORGANISMS SEEN    CULT  01/24/2021    NO GROWTH Performed at [facility], [address] [place], [place] [address]    LABORGA KLEBSIELLA PNEUMONIAE  05/21/2018   LABORGA ENTEROBACTER SPECIES 05/21/2018   LABORGA STAPHYLOCOCCUS AUREUS 05/21/2018     Lab Results  Component Value Date   ALBUMIN 3.8 03/15/2022   ALBUMIN 3.6 01/12/2022   ALBUMIN 3.6 12/08/2021   PREALBUMIN 6.8 (L) 10/21/2019    Lab Results  Component Value Date   MG 2.3 11/29/2021   MG 1.5 (L) 04/12/2021   MG 1.8 02/16/2021   Lab Results  Component Value Date   VD25OH 11 (L) 08/16/2009    Lab Results  Component Value Date   PREALBUMIN 6.8 (L) 10/21/2019      Latest Ref Rng & Units 03/27/2022    3:31 AM 03/15/2022    6:38 PM 03/08/2022    9:46 AM  CBC EXTENDED  WBC 4.0 - 10.5 K/uL 14.8  12.0  10.9   RBC 4.22 - 5.81 MIL/uL 3.75  3.96  4.19   Hemoglobin 13.0 - 17.0 g/dL 67.1  24.5  80.9   HCT 39.0 - 52.0 % 34.4  35.7  37.6   Platelets 150 - 400 K/uL 185  205  155   NEUT# 1.7 - 7.7 K/uL 12.9   9.2   Lymph# 0.7 - 4.0 K/uL 0.8   0.8      There is no height or weight on file to  calculate BMI.  Orders:  No orders of the defined types were placed in this encounter.  No orders of the defined types were placed in this encounter.    Procedures: No procedures performed  Clinical Data: No additional findings.  ROS:  All other systems negative, except as noted in the HPI. Review of Systems  Objective: Vital Signs: There were no vitals taken for this visit.  Specialty Comments:  No specialty comments available.  PMFS History: Patient Active Problem List   Diagnosis Date Noted   ICD (implantable cardioverter-defibrillator) in place 04/19/2022   Near syncope 12/08/2021   CKD (chronic kidney disease) stage 3, GFR 30-59 ml/min (HCC) 12/08/2021   PVD s/p R LE amputation  12/08/2021   Diabetic retinopathy associated with type 2 diabetes mellitus (HCC) 06/06/2021   CAD (coronary artery disease) with flat troponin  04/11/2021   S/P below knee amputation, right (HCC) 02/07/2021   QT prolongation 07/30/2019   Acute on chronic combined systolic and  diastolic CHF (congestive heart failure) (HCC) 07/29/2019   Stroke (HCC) 05/08/2017   Uncontrolled type 2 diabetes mellitus with hyperglycemia, with long-term current use of insulin (HCC) 12/07/2015   Chronic systolic heart failure (HCC) 10/02/2011   Erectile dysfunction 10/02/2011   Obstructive sleep apnea 10/11/2007   HLD (hyperlipidemia) 10/10/2007   HYPOKALEMIA 10/10/2007   Obesity, Class III, BMI 40-49.9 (morbid obesity) (HCC) 10/10/2007   Essential hypertension 10/10/2007   PREMATURE VENTRICULAR CONTRACTIONS 10/10/2007   COLONIC POLYPS, HX OF 10/10/2007   Past Medical History:  Diagnosis Date   Bipolar disorder (HCC)    CAD (coronary artery disease)    a. diffuse 3v CAD by cath 2019, medical therapy recommended.   Cataract    forming    Chronic systolic CHF (congestive heart failure) (HCC)    CVA (cerebral infarction)    No residual deficits   Depression    PTSD,    Diabetes mellitus    TYPE II; UNCONTROLLED BY HEMOGLOBIN A1c; STABLE AS  PER DISCHARGE   Headache(784.0)    Herpes simplex of male genitalia    History of colonic polyps    Hyperlipidemia    Hypertension    Myocardial infarction (HCC) 1987   (while playing football)   Obesity    OSA (obstructive sleep apnea)    repeat study 2018 without significant OSA   Pneumonia    Post-cardiac injury syndrome (HCC)    History of cardiac injury from blunt trauma   Pulmonary hypertension (HCC)    a. moderately elevated PASP 07/2019.   PVCs (premature ventricular contractions)    Schizophrenia (HCC)    Goes to [facility]   Sleep apnea    Stroke [facility]) 2005   some left side weakness   Syncope    Recurrent, thought to be vasovagal. Also has h/o frequent PVCs.     Family History  Problem Relation Age of Onset   Heart disease Mother        MI   Heart failure Mother    Diabetes Mother        ALSO IN MOST OF HIS SIBLINGS; 2 UNLCES HAVE ALSO PASSED AWAY FROM DM   Cardiomyopathy Mother    Cancer -  Ovarian Mother    Ovarian cancer Mother    Heart disease Father    Hypertension Father    Diabetes Father    Diabetes Sister    Diabetes Brother    Colon cancer Paternal Uncle    Colon cancer Paternal Uncle  Colon polyps Neg Hx    Esophageal cancer Neg Hx    Rectal cancer Neg Hx    Stomach cancer Neg Hx     Past Surgical History:  Procedure Laterality Date   AMPUTATION Right 01/25/2021   Procedure: RIGHT BELOW KNEE AMPUTATION;  Surgeon: Nadara Mustard, MD;  Location: [facility] OR;  Service: Orthopedics;  Laterality: Right;   CARDIAC CATHETERIZATION  12/19/10   DIFFUSE NONOBSTRUCTIVE CAD; NONISCHEMIC CARDIOMYOPATHY; LEFT VENTRICULAR ANGIOGRAM WAS PERFORMED SECONDARY TO  ELEVATED LEFT VENTRICULAR FILLING PRESSURES   COLONOSCOPY     ~ age 48-23   COLONOSCOPY W/ POLYPECTOMY     I & D EXTREMITY Bilateral 10/24/2019   Procedure: IRRIGATION AND DEBRIDEMENT BILATERAL EXTREMITY WOUND ON FOOT;  Surgeon: Allena Napoleon, MD;  Location: [facility] OR;  Service: Plastics;  Laterality: Bilateral;   ICD IMPLANT N/A 01/13/2022   Procedure: ICD IMPLANT;  Surgeon: Marinus Maw, MD;  Location[facility] INVASIVE CV LAB;  Service: Cardiovascular;  Laterality: N/A;   METATARSAL HEAD EXCISION Right 08/29/2018   Procedure: METATARSAL HEAD RESECTION;  Surgeon: Felecia Shelling, DPM;  Location: [facility] OR;  Service: Podiatry;  Laterality: Right;   METATARSAL OSTEOTOMY Right 08/29/2018   Procedure: SUB FIFTHE METATARSIA RIGHT FOOT;  Surgeon: Felecia Shelling, DPM;  Location: [facility] OR;  Service: Podiatry;  Laterality: Right;   MULTIPLE EXTRACTIONS WITH ALVEOLOPLASTY  01/27/2014   "all my teeth; 4 Quadrants of alveoloplasty   MULTIPLE EXTRACTIONS WITH ALVEOLOPLASTY N/A 01/27/2014   Procedure: EXTRACTION OF TEETH #'1, 2, 3, 4, 5, 6, 7, 8, 9, 10, 11, 12, 13, 14, 15, 16, 17, 20, 21, 22, 23, 24, 25, 26, 27, 28, 29, 31 and 32 WITH ALVEOLOPLASTY;  Surgeon: Charlynne Pander, DDS;  Location: [facility] OR;  Service: Oral Surgery;  Laterality: N/A;   ORIF  FINGER / THUMB FRACTURE Right    POLYPECTOMY     RIGHT HEART CATH N/A 01/27/2021   Procedure: RIGHT HEART CATH;  Surgeon: Dolores Patty, MD;  Location: [facility] INVASIVE CV LAB;  Service: Cardiovascular;  Laterality: N/A;   RIGHT/LEFT HEART CATH AND CORONARY ANGIOGRAPHY N/A 09/26/2017   Procedure: RIGHT/LEFT HEART CATH AND CORONARY ANGIOGRAPHY;  Surgeon: Dolores Patty, MD;  [facility] CV LAB;  Service: Cardiovascular;  Laterality: N/A;   RIGHT/LEFT HEART CATH AND CORONARY ANGIOGRAPHY N/A 04/12/2021   Procedure: RIGHT/LEFT HEART CATH AND CORONARY ANGIOGRAPHY;  Surgeon: Dolores Patty, MD;  [facility] LAB;  Service: Cardiovascular;  Laterality: N/A;   SKIN SPLIT GRAFT Bilateral 10/24/2019   Procedure: SKIN GRAFT SPLIT THICKNESS LEFT THIGH;  Surgeon: Allena Napoleon, MD;  Location: [facility] OR;  Service: Plastics;  Laterality: Bilateral;   WOUND DEBRIDEMENT Right 08/29/2018   Procedure: Debridement of ulcer on right fifth metatarsal;  Surgeon: Felecia Shelling, DPM;  Location: [facility] OR;  Service: Podiatry;  Laterality: Right;   Social History   Occupational History   Occupation: Mortician  Tobacco Use   Smoking status: Never   Smokeless tobacco: Never  Vaping Use   Vaping Use: Never used  Substance and Sexual Activity   Alcohol use: No   Drug use: No   Sexual activity: Not on file

## 2022-06-12 NOTE — ED Triage Notes (Signed)
Pt states that he injured his R middle finger this afternoon at work. Pt also reports R leg pain where his BKA is. Pt states that his stump is hard and swollen and that he can't put his prosthetic on.

## 2022-06-13 ENCOUNTER — Encounter: Payer: Self-pay | Admitting: Family Medicine

## 2022-06-13 ENCOUNTER — Emergency Department (HOSPITAL_BASED_OUTPATIENT_CLINIC_OR_DEPARTMENT_OTHER): Payer: Worker's Compensation | Admitting: Radiology

## 2022-06-13 ENCOUNTER — Emergency Department (HOSPITAL_BASED_OUTPATIENT_CLINIC_OR_DEPARTMENT_OTHER)
Admission: EM | Admit: 2022-06-13 | Discharge: 2022-06-13 | Disposition: A | Discharge status: Home / Self Care | Payer: Worker's Compensation | Attending: Emergency Medicine | Admitting: Emergency Medicine

## 2022-06-13 DIAGNOSIS — S67192A Crushing injury of right middle finger, initial encounter: Secondary | ICD-10-CM

## 2022-06-13 DIAGNOSIS — M7989 Other specified soft tissue disorders: Secondary | ICD-10-CM

## 2022-06-13 MED ORDER — LIDOCAINE HCL 2 % IJ SOLN
10.0000 mL | Freq: Once | INTRAMUSCULAR | Status: AC
  Administered 2022-06-13: 200 mg
  Filled 2022-06-13: qty 20

## 2022-06-13 MED ORDER — CEPHALEXIN 500 MG PO CAPS: 500.0000 mg | ORAL_CAPSULE | Freq: Three times a day (TID) | ORAL | 0 refills | Status: AC

## 2022-06-13 MED ORDER — CEPHALEXIN 250 MG PO CAPS
500.0000 mg | ORAL_CAPSULE | Freq: Once | ORAL | Status: AC
  Administered 2022-06-13: 500 mg via ORAL
  Filled 2022-06-13: qty 2

## 2022-06-13 NOTE — ED Provider Notes (Signed)
[facility] EMERGENCY DEPT  Provider Note  CSN: [id number] Arrival date & time: 06/12/22 2125  History Chief Complaint  Patient presents with   Finger Injury   Leg Swelling    Mitchell [name] is a 51 y.o. male with history of DM is about 4 months s/p R BKA, followed by Dr. Sharol Given, reports he has had some swelling in the stump over the last few days, seen at Dr. Jess Barters office on 11/13 and given instructions to use a different stump shrinker. He also reports he got his finger caught in a machine at work on the morning of 11/13 sustaining a laceration to the ulnar nail margin of the R middle finger. He states the 'company doctor' looked at it while he was at work and wrapped it up but did not repair the laceration or start Abx. He reports he has continued to have some oozing of blood from the finger since then as well as persistent pain.    Home Medications Prior to Admission medications   Medication Sig Start Date End Date Taking? Authorizing Provider  cephALEXin (KEFLEX) 500 MG capsule Take 1 capsule (500 mg total) by mouth 3 (three) times daily for 7 days. 06/13/22 06/20/22 Yes Truddie Hidden, MD  Accu-Chek Softclix Lancets lancets Use to test blood sugars up to 4 times daily as needed. Patient not taking: Reported on 05/02/2022 12/08/21   [name], MD  amitriptyline (ELAVIL) 10 MG tablet TAKE 1 TABLET BY MOUTH AT BEDTIME Patient not taking: Reported on 04/25/2022 12/12/21   Izora Ribas, MD  Ascorbic Acid (VITAMIN C PO) Take 1 tablet by mouth daily. Patient not taking: Reported on 04/25/2022    [provider]  aspirin 81 MG EC tablet Take 1 tablet (81 mg total) by mouth daily. 04/13/21   Danford, Suann Larry, MD  Blood Glucose Monitoring Suppl (BLOOD GLUCOSE MONITOR SYSTEM) w/Device KIT Use to test blood sugar up to 4 times daily as needed. Patient not taking: Reported on 04/25/2022 12/08/21   [name], MD  carvedilol (COREG) 6.25 MG tablet TAKE  1 TABLET BY MOUTH TWICE DAILY WITH A MEAL 05/03/22   [name], [name], MD  Cholecalciferol (VITAMIN D-3 PO) Take 1 capsule by mouth daily.    [provider]  clopidogrel (PLAVIX) 75 MG tablet Take 1 tablet by mouth once daily 03/17/22   Bensimhon, Shaune Pascal, MD  doxycycline (VIBRAMYCIN) 100 MG capsule Take 1 capsule (100 mg total) by mouth 2 (two) times daily. One po bid x 7 days 04/28/22   Veryl Speak, MD  glucose blood (ACCU-CHEK GUIDE) test strip Use to test blood sugars up to 4 times daily as needed. Patient not taking: Reported on 04/25/2022 12/08/21   [name], MD  Insulin Glargine [facility]) 100 UNIT/ML Inject 21 Units into the skin 2 (two) times daily.    [provider]  methocarbamol (ROBAXIN) 500 MG tablet Take 1 tablet (500 mg total) by mouth every 6 (six) hours as needed for muscle spasms. 04/13/22   Shelda Pal, DO  oxyCODONE (OXY IR/ROXICODONE) 5 MG immediate release tablet Take 0.5-1 tablets (2.5-5 mg total) by mouth every 6 (six) hours as needed for severe pain. Patient not taking: Reported on 05/02/2022 04/13/22   Shelda Pal, DO  pantoprazole (PROTONIX) 40 MG tablet Take 1 tablet (40 mg total) by mouth daily as needed (acid reflux). 02/13/22   [name], [name], MD  potassium chloride SA (KLOR-CON M) 20 MEQ tablet  Take 1 tablet (20 mEq total) by mouth daily. 05/05/22   Rafael Bihari, FNP  rosuvastatin (CRESTOR) 40 MG tablet Take 1 tablet by mouth once daily 03/17/22   Bensimhon, Shaune Pascal, MD  sacubitril-valsartan (ENTRESTO) 97-103 MG Take 1 tablet by mouth 2 (two) times daily. 05/04/21   Rafael Bihari, FNP  spironolactone (ALDACTONE) 25 MG tablet Take 1 tablet by mouth once daily 03/17/22   Bensimhon, Shaune Pascal, MD  torsemide (DEMADEX) 20 MG tablet Take 2 tablets (40 mg total) by mouth daily. 04/19/22   [place], Maricela Bo, FNP     Allergies    [name] [dapagliflozin], Nsaids, and Trulicity [dulaglutide]   Review of  Systems   Review of Systems Please see HPI for pertinent positives and negatives  Physical Exam BP (!) 151/84   Pulse 82   Temp 98.3 F (36.8 C)   Resp 18   Ht _0  (1.803 m)   Wt 131.5 kg   SpO2 99%   BMI 40.45 kg/m   Physical Exam Vitals and nursing note reviewed.  HENT:     Head: Normocephalic.     Nose: Nose normal.  Eyes:     Extraocular Movements: Extraocular movements intact.  Pulmonary:     Effort: Pulmonary effort is normal.  Musculoskeletal:        General: Normal range of motion.     Cervical back: Neck supple.     Comments: R BKA stump in a stump shrinker. There is a superficial laceration to R middle finger at the ulnar nail margin oozing small amount of blood.   Skin:    Findings: No rash (on exposed skin).  Neurological:     Mental Status: He is alert and oriented to person, place, and time.  Psychiatric:        Mood and Affect: Mood normal.     ED Results / Procedures / Treatments   EKG None  Procedures .Nerve Block  Date/Time: 06/13/2022 4:04 AM  Performed by: Truddie Hidden, MD Authorized by: Truddie Hidden, MD   Consent:    Consent obtained:  Verbal   Consent given by:  Patient Location:    Body area:  Upper extremity   Upper extremity nerve blocked: digital.   Laterality:  Right Pre-procedure details:    Skin preparation:  Alcohol Skin anesthesia:    Skin anesthesia method:  Local infiltration   Local anesthetic:  Lidocaine 2% w/o epi Procedure details:    Block needle gauge:  25 G   Medications Ordered in the ED Medications  cephALEXin (KEFLEX) capsule 500 mg (has no administration in time range)  lidocaine (XYLOCAINE) 2 % (with pres) injection 200 mg (200 mg Infiltration Given 06/13/22 0357)    Initial Impression and Plan  Patient advised that his stump issues will need to be managed by Dr. Jess Barters office and that we are not able to make any changes to his plan. Labs done in triage show CBC unremarkable. BMP with  hyperglycemia without anion gap and CKD at baseline.   As for the finger injury. I personally viewed the images from radiology studies and agree with radiologist interpretation: Xray neg for fracture. I discussed with patient that primary repair of his finger laceration would be high risk with this delay in presentation. Will do a digital block for pain control, begin Abx for infection prophylaxis and follow up with [facility] for further management.    ED Course       MDM Rules/Calculators/A&P Medical  Decision Making Problems Addressed: Crushing injury of right middle finger, initial encounter: acute illness or injury Right leg swelling: chronic illness or injury  Amount and/or Complexity of Data Reviewed Labs: ordered. Decision-making details documented in ED Course. Radiology: ordered and independent interpretation performed. Decision-making details documented in ED Course.  Risk Prescription drug management.    Final Clinical Impression(s) / ED Diagnoses Final diagnoses:  Right leg swelling  Crushing injury of right middle finger, initial encounter    Rx / DC Orders ED Discharge Orders          Ordered    cephALEXin (KEFLEX) 500 MG capsule  3 times daily        06/13/22 0406             Truddie Hidden, MD 06/13/22 [phone]

## 2022-06-14 ENCOUNTER — Telehealth: Payer: Self-pay | Admitting: Family Medicine

## 2022-06-14 NOTE — Telephone Encounter (Signed)
Patient dropped off form for [name] to fill out  Placed in her bin up front  Patient also has an appt tomorrow 11/16

## 2022-06-15 ENCOUNTER — Ambulatory Visit (INDEPENDENT_AMBULATORY_CARE_PROVIDER_SITE_OTHER): Payer: No Typology Code available for payment source | Admitting: Family Medicine

## 2022-06-15 VITALS — BP 122/78 | HR 84 | Temp 98.2°F | Resp 18 | Ht 71.0 in | Wt 278.6 lb

## 2022-06-15 DIAGNOSIS — S6710XD Crushing injury of unspecified finger(s), subsequent encounter: Secondary | ICD-10-CM

## 2022-06-15 DIAGNOSIS — N183 Chronic kidney disease, stage 3 unspecified: Secondary | ICD-10-CM

## 2022-06-15 DIAGNOSIS — E1169 Type 2 diabetes mellitus with other specified complication: Secondary | ICD-10-CM | POA: Diagnosis not present

## 2022-06-15 DIAGNOSIS — H547 Unspecified visual loss: Secondary | ICD-10-CM

## 2022-06-15 DIAGNOSIS — Z794 Long term (current) use of insulin: Secondary | ICD-10-CM

## 2022-06-15 DIAGNOSIS — I10 Essential (primary) hypertension: Secondary | ICD-10-CM

## 2022-06-15 DIAGNOSIS — Z89511 Acquired absence of right leg below knee: Secondary | ICD-10-CM | POA: Diagnosis not present

## 2022-06-15 LAB — HEMOGLOBIN A1C: Hgb A1c MFr Bld: 11.8 % — ABNORMAL HIGH (ref 4.6–6.5)

## 2022-06-15 LAB — LIPID PANEL
Cholesterol: 162 mg/dL (ref 0–200)
HDL: 33.1 mg/dL — ABNORMAL LOW (ref >39.00)
LDL Cholesterol: 100 mg/dL — ABNORMAL HIGH (ref 0–99)
NonHDL: 128.44
Total CHOL/HDL Ratio: 5
Triglycerides: 144 mg/dL (ref 0.0–149.0)
VLDL: 28.8 mg/dL (ref 0.0–40.0)

## 2022-06-15 MED ORDER — ACETAMINOPHEN-CODEINE 300-30 MG PO TABS: 1.0000 | ORAL_TABLET | Freq: Four times a day (QID) | ORAL | 0 refills | Status: DC | PRN

## 2022-06-15 MED ORDER — BLOOD GLUCOSE METER KIT: PACK | 0 refills | Status: DC

## 2022-06-15 NOTE — Progress Notes (Signed)
[name] [address], [place], [place] [address] [phone]  Date:  06/15/2022   Name:  Mitchell [name]   DOB:  1971/02/05   MRN:  [id number]  PCP:  [name], MD    Chief Complaint: ER follow up (Finger injury R middle11/14/2023. Hospital could not save the finger- referral was placed to Hand. Injury occurred at work- [employer] of the [place], [place])   History of Present Illness:  Mitchell [name] is a 51 y.o. very pleasant male patient who presents with the following:  Patient is seen today with concern of recent finger injury Most recent visit with myself was in February of this year History of CVA, schizophrenia versus bipolar disorder, OSA, obesity, CAD, HTN, hyperlipidemia, DM He was recently admitted from 6/26 through 7/11 of 2022; he had a complex hospital course including a right BKA.  He was then admitted to rehab and discharged home on 02/17/21  Lab Results  Component Value Date   HGBA1C 7.8 (A) 08/19/2021   He needs an A1c update, lipid  He presented to the ER on 11/14 with concern of crush injury to the right middle finger at work-injury occurred the day before on 11/13 while he was at work. He got his finger caught in a machine.  The "company doctor" saw him, pt states they did not advise him to get stitches at that time- he later got concerned and went to the ER - he states he went the same day, likely the note was dated after midnight   ER noted a superficial laceration to the right long finger at the ulnar nail margin.   X-ray negative for fracture He was advised due to delay in presentation to care it was better not to suture his wound, he was started antibiotics and asked to see me for follow-up  Erline Levine had a BMP, CBC done in the ER  He is seeing the hand surgery on Monday   Carvedilol 6.25 twice daily Plavix 75 Basaglar Crestor 40 Entresto Spironolactone Potassium 20 mill  equivalents daily Torsemide  He was seen by Cristopher Peru in September for follow-up of cardiomyopathy He had a defibrillator placed over the summer due to his cardiomyopathy  ?nephrology care - he is a pt at [facility]- encouraged him to follow-up and he will do so   Patient Active Problem List   Diagnosis Date Noted   ICD (implantable cardioverter-defibrillator) in place 04/19/2022   Near syncope 12/08/2021   CKD (chronic kidney disease) stage 3, GFR 30-59 ml/min ([facility]) 12/08/2021   PVD s/p R LE amputation  12/08/2021   Diabetic retinopathy associated with type 2 diabetes mellitus ([place]) 06/06/2021   CAD (coronary artery disease) with flat troponin  04/11/2021   S/P below knee amputation, right ([place]) 02/07/2021   QT prolongation 07/30/2019   Acute on chronic combined systolic and diastolic CHF (congestive heart failure) ([place]) 07/29/2019   Stroke ([place]) 05/08/2017   Uncontrolled type 2 diabetes mellitus with hyperglycemia, with long-term current use of insulin ([place]) 27/74/1287   Chronic systolic heart failure ([place]) 10/02/2011   Erectile dysfunction 10/02/2011   Obstructive sleep apnea 10/11/2007   HLD (hyperlipidemia) 10/10/2007   HYPOKALEMIA 10/10/2007   Obesity, Class III, BMI 40-49.9 (morbid obesity) ([place]) 10/10/2007   Essential hypertension 10/10/2007   PREMATURE VENTRICULAR CONTRACTIONS 10/10/2007   COLONIC POLYPS, HX OF 10/10/2007      Past Medical History:  Diagnosis Date  Bipolar disorder ([place])    CAD (coronary artery disease)    a. diffuse 3v CAD by cath 2019, medical therapy recommended.   Cataract    forming    Chronic systolic CHF (congestive heart failure) (HCC)    CVA (cerebral infarction)    No residual deficits   Depression    PTSD,    Diabetes mellitus    TYPE II; UNCONTROLLED BY HEMOGLOBIN A1c; STABLE AS  PER DISCHARGE   Headache(784.0)    Herpes simplex of male genitalia    History of colonic polyps    Hyperlipidemia    Hypertension     Myocardial infarction ([place]) 1987   (while playing football)   Obesity    OSA (obstructive sleep apnea)    repeat study 2018 without significant OSA   Pneumonia    Post-cardiac injury syndrome ([place])    History of cardiac injury from blunt trauma   Pulmonary hypertension ([place])    a. moderately elevated PASP 07/2019.   PVCs (premature ventricular contractions)    Schizophrenia ([place])    Goes to [facility]   Sleep apnea    Stroke [facility]) 2005   some left side weakness   Syncope    Recurrent, thought to be vasovagal. Also has h/o frequent PVCs.     Past Surgical History:  Procedure Laterality Date   AMPUTATION Right 01/25/2021   Procedure: RIGHT BELOW KNEE AMPUTATION;  Surgeon: [name], MD;  Location: [facility];  Service: Orthopedics;  Laterality: Right;   CARDIAC CATHETERIZATION  12/19/10   DIFFUSE NONOBSTRUCTIVE CAD; NONISCHEMIC CARDIOMYOPATHY; LEFT VENTRICULAR ANGIOGRAM WAS PERFORMED SECONDARY TO  ELEVATED LEFT VENTRICULAR FILLING PRESSURES   COLONOSCOPY     ~ age [age]   COLONOSCOPY W/ POLYPECTOMY     I & D EXTREMITY Bilateral 10/24/2019   Procedure: IRRIGATION AND DEBRIDEMENT BILATERAL EXTREMITY WOUND ON FOOT;  Surgeon: Cindra Presume, MD;  Location: [facility];  Service: Plastics;  Laterality: Bilateral;   ICD IMPLANT N/A 01/13/2022   Procedure: ICD IMPLANT;  Surgeon: Evans Lance, MD;  Location: [facility];  Service: Cardiovascular;  Laterality: N/A;   METATARSAL HEAD EXCISION Right 08/29/2018   Procedure: METATARSAL HEAD RESECTION;  Surgeon: Edrick Kins, DPM;  Location: [facility];  Service: Podiatry;  Laterality: Right;   METATARSAL OSTEOTOMY Right 08/29/2018   Procedure: SUB FIFTHE METATARSIA RIGHT FOOT;  Surgeon: Edrick Kins, DPM;  Location: [facility];  Service: Podiatry;  Laterality: Right;   MULTIPLE EXTRACTIONS WITH ALVEOLOPLASTY  01/27/2014   "all my teeth; 4 Quadrants of alveoloplasty   MULTIPLE EXTRACTIONS WITH ALVEOLOPLASTY N/A 01/27/2014    Procedure: EXTRACTION OF TEETH #'1, 2, 3, 4, 5, 6, 7, 8, 9, 10, 11, 12, 13, 14, 15, 16, 17, 20, 21, 22, 23, 24, 25, 26, 27, 28, 29, 31 and 32 WITH ALVEOLOPLASTY;  Surgeon: Lenn Cal, DDS;  Location: [facility];  Service: Oral Surgery;  Laterality: N/A;   ORIF FINGER / THUMB FRACTURE Right    POLYPECTOMY     RIGHT HEART CATH N/A 01/27/2021   Procedure: RIGHT HEART CATH;  Surgeon: Jolaine Artist, MD;  Location: [facility];  Service: Cardiovascular;  Laterality: N/A;   RIGHT/LEFT HEART CATH AND CORONARY ANGIOGRAPHY N/A 09/26/2017   Procedure: RIGHT/LEFT HEART CATH AND CORONARY ANGIOGRAPHY;  Surgeon: Jolaine Artist, MD;  Location: [facility];  Service: Cardiovascular;  Laterality: N/A;   RIGHT/LEFT HEART CATH AND CORONARY ANGIOGRAPHY N/A 04/12/2021  Procedure: RIGHT/LEFT HEART CATH AND CORONARY ANGIOGRAPHY;  Surgeon: Jolaine Artist, MD;  Location: [facility];  Service: Cardiovascular;  Laterality: N/A;   SKIN SPLIT GRAFT Bilateral 10/24/2019   Procedure: SKIN GRAFT SPLIT THICKNESS LEFT THIGH;  Surgeon: Cindra Presume, MD;  Location: [facility];  Service: Plastics;  Laterality: Bilateral;   WOUND DEBRIDEMENT Right 08/29/2018   Procedure: Debridement of ulcer on right fifth metatarsal;  Surgeon: Edrick Kins, DPM;  Location: [facility];  Service: Podiatry;  Laterality: Right;    Social History   Tobacco Use   Smoking status: Never   Smokeless tobacco: Never  Vaping Use   Vaping Use: Never used  Substance Use Topics   Alcohol use: No   Drug use: No    Family History  Problem Relation Age of Onset   Heart disease Mother        MI   Heart failure Mother    Diabetes Mother        ALSO IN MOST OF HIS SIBLINGS; 2 UNLCES HAVE ALSO PASSED AWAY FROM DM   Cardiomyopathy Mother    Cancer - Ovarian Mother    Ovarian cancer Mother    Heart disease Father    Hypertension Father    Diabetes Father    Diabetes Sister    Diabetes Brother    Colon cancer Paternal  Uncle    Colon cancer Paternal Uncle    Colon polyps Neg Hx    Esophageal cancer Neg Hx    Rectal cancer Neg Hx    Stomach cancer Neg Hx     Allergies  Allergen Reactions   Farxiga [Dapagliflozin] Other (See Comments)    Patient reports loss of consciousness   Nsaids Anaphylaxis and Rash    Able to tolerate aspirin    Trulicity [Dulaglutide] Nausea And Vomiting    Medication list has been reviewed and updated.  Current Outpatient Medications on File Prior to Visit  Medication Sig Dispense Refill   Accu-Chek Softclix Lancets lancets Use to test blood sugars up to 4 times daily as needed. (Patient not taking: Reported on 05/02/2022) 100 each 0   amitriptyline (ELAVIL) 10 MG tablet TAKE 1 TABLET BY MOUTH AT BEDTIME (Patient not taking: Reported on 04/25/2022) 30 tablet 0   Ascorbic Acid (VITAMIN C PO) Take 1 tablet by mouth daily. (Patient not taking: Reported on 04/25/2022)     aspirin 81 MG EC tablet Take 1 tablet (81 mg total) by mouth daily. 30 tablet 12   Blood Glucose Monitoring Suppl (BLOOD GLUCOSE MONITOR SYSTEM) w/Device KIT Use to test blood sugar up to 4 times daily as needed. (Patient not taking: Reported on 04/25/2022) 1 kit 0   carvedilol (COREG) 6.25 MG tablet TAKE 1 TABLET BY MOUTH TWICE DAILY WITH A MEAL 180 tablet 3   cephALEXin (KEFLEX) 500 MG capsule Take 1 capsule (500 mg total) by mouth 3 (three) times daily for 7 days. 21 capsule 0   Cholecalciferol (VITAMIN D-3 PO) Take 1 capsule by mouth daily.     clopidogrel (PLAVIX) 75 MG tablet Take 1 tablet by mouth once daily 90 tablet 3   glucose blood (ACCU-CHEK GUIDE) test strip Use to test blood sugars up to 4 times daily as needed. (Patient not taking: Reported on 04/25/2022) 100 each 0   Insulin Glargine (BASAGLAR KWIKPEN) 100 UNIT/ML Inject 21 Units into the skin 2 (two) times daily.     methocarbamol (ROBAXIN) 500 MG tablet Take 1 tablet (500 mg  total) by mouth every 6 (six) hours as needed for muscle spasms. 30 tablet 0    oxyCODONE (OXY IR/ROXICODONE) 5 MG immediate release tablet Take 0.5-1 tablets (2.5-5 mg total) by mouth every 6 (six) hours as needed for severe pain. (Patient not taking: Reported on 05/02/2022) 30 tablet 0   pantoprazole (PROTONIX) 40 MG tablet Take 1 tablet (40 mg total) by mouth daily as needed (acid reflux). 30 tablet 6   potassium chloride SA (KLOR-CON M) 20 MEQ tablet Take 1 tablet (20 mEq total) by mouth daily. 90 tablet 2   rosuvastatin (CRESTOR) 40 MG tablet Take 1 tablet by mouth once daily 90 tablet 3   sacubitril-valsartan (ENTRESTO) 97-103 MG Take 1 tablet by mouth 2 (two) times daily. 60 tablet 11   spironolactone (ALDACTONE) 25 MG tablet Take 1 tablet by mouth once daily 90 tablet 3   torsemide (DEMADEX) 20 MG tablet Take 2 tablets (40 mg total) by mouth daily. 180 tablet 11   No current facility-administered medications on file prior to visit.    Review of Systems:  As per HPI- otherwise negative.   Physical Examination: Vitals:   06/15/22 0957  BP: 122/78  Pulse: 84  Resp: 18  Temp: 98.2 F (36.8 C)  SpO2: 98%   Vitals:   06/15/22 0957  Weight: 278 lb 9.6 oz (126.4 kg)  Height: _0  (1.803 m)   Body mass index is 38.86 kg/m. Ideal Body Weight: Weight in (lb) to have BMI = 25: 178.9  GEN: no acute distress. HEENT: Atraumatic, Normocephalic.  Ears and Nose: No external deformity. CV: RRR, No M/G/R. No JVD. No thrill. No extra heart sounds. PULM: CTA B, no wheezes, crackles, rhonchi. No retractions. No resp. distress. No accessory muscle use. ABD: S, NT, ND, +BS. No rebound. No HSM. EXTR: No c/c/e PSYCH: Normally interactive. Conversant. S/p right BKA, uses a cane  Right long finger:    Wound washed with soap and water, dressed with vaseline gauze, roll gauze and cobain    Assessment and Plan: Crushing injury of finger, subsequent encounter - Plan: acetaminophen-codeine (TYLENOL #3) 300-30 MG tablet  Hx of right BKA ([place] [place])  Type 2 diabetes  mellitus with other specified complication, with long-term current use of insulin ([place]) - Plan: Hemoglobin A1c, Lipid panel  Low vision, unspecified left eye visual impairment category, unspecified right eye visual impairment category  Essential hypertension  Stage 3 chronic kidney disease, unspecified whether stage 3a or 3b CKD ([place])  Patient seen today with concern of a right long finger injury which occurred at work on 11/13 He has an appoint with hand surgery on Monday.  He is on appropriate antibiotics, at this point no evidence of infection.  I washed and rebandaged the wound today-reassured that it looks like it is healing okay  We will follow-up on his diabetes today, labs are pending as above Signed [name], MD  Received labs as below, called patient-his last A1c was 7.8% in January, it is gone up dramatically He is using insulin glargine, 21 units twice daily.  He notes he prefers to take it twice daily Advised him that we do need to work on increasing his insulin to get his blood sugar under better control, but he reports he does not actually have a glucometer right now.  I will send a prescription for glucometer and associated supplies I asked him to check his fasting blood glucose in the morning.  As long as it is higher than 150-200  he can increase his total insulin dose by 2 units every 2 to 3 days. It sounds like he used to see Dr. Loanne Drilling, but he has left our group.  I will work on getting him a new endocrinology referral  Plan to have him follow-up in about 2 weeks Results for orders placed or performed in visit on 06/15/22  Hemoglobin A1c  Result Value Ref Range   Hgb A1c MFr Bld 11.8 (H) 4.6 - 6.5 %  Lipid panel  Result Value Ref Range   Cholesterol 162 0 - 200 mg/dL   Triglycerides 144.0 0.0 - 149.0 mg/dL   HDL 33.10 (L) >39.00 mg/dL   VLDL 28.8 0.0 - 40.0 mg/dL   LDL Cholesterol 100 (H) 0 - 99 mg/dL   Total CHOL/HDL Ratio 5    NonHDL 128.44

## 2022-06-15 NOTE — Telephone Encounter (Signed)
Form in folder

## 2022-06-15 NOTE — Patient Instructions (Signed)
It was good to see you again today Plan to see hand surgeon on Monday!  Until then keep your wound clean and covered.  You can shower but then re-apply a clean and dry dressing.  Ok to wash wound with soap and water if needed Continue antibiotics  Please do contact your nephrologist [facility]) and set up a recheck appt soon We will check on your A1c and cholesterol today

## 2022-06-16 ENCOUNTER — Encounter: Payer: Self-pay | Admitting: Family Medicine

## 2022-06-16 NOTE — Progress Notes (Addendum)
   Acute Office Visit  Subjective:     Patient ID: Mitchell [name], male    DOB: 1971-02-13, 51 y.o.   MRN: [id number]  No chief complaint on file.  CC: finger injury  HPI Patient is in today for workplace injury. Patient states that he feels well.  Denies any drugs alcohol use.  States that he is right-handed.  States he was moving material by the needle and his finger got caught.  Patient states there was profuse bleeding after this.  States bleeding is controlled somewhat.  Patient does take Plavix.  Review of Systems  All other systems reviewed and are negative.       Objective:    BP 127/85   Pulse 96   Temp 98.6 F (37 C)   Resp 12   SpO2 97%    Physical Exam Vitals and nursing note reviewed.  Constitutional:      General: He is not in acute distress.    Appearance: Normal appearance. He is obese. He is not ill-appearing.  HENT:     Head: Normocephalic and atraumatic.     Mouth/Throat:     Mouth: Mucous membranes are moist.  Eyes:     Extraocular Movements: Extraocular movements intact.     Pupils: Pupils are equal, round, and reactive to light.  Cardiovascular:     Rate and Rhythm: Normal rate and regular rhythm.     Heart sounds: Normal heart sounds.  Pulmonary:     Effort: Pulmonary effort is normal.     Breath sounds: Normal breath sounds.  Abdominal:     General: Abdomen is flat. Bowel sounds are normal. There is no distension.     Palpations: Abdomen is soft.     Tenderness: There is no abdominal tenderness.  Musculoskeletal:     Comments: R leg amputation  Skin:    Capillary Refill: Capillary refill takes less than 2 seconds.     Findings: No erythema.     Comments: Distal laceration through the tip of the R middle finger without nail disruption, sensation intact, neurovascularly intact  Neurological:     General: No focal deficit present.     Mental Status: He is alert and oriented to person, place, and time.     No results found for any  visits on 06/12/22.      Assessment & Plan:   Problem List Items Addressed This Visit   None Visit Diagnoses     Accident at workplace    -  Primary   Laceration of finger of right hand without foreign body without damage to nail, unspecified finger, initial encounter          -Per patient tetanus shot up-to-date - Distal right third finger laceration, wound reapproximated, was thoroughly washed with tap water earlier, does not appear deep enough to cause damage to the bone, machinery, injury is fully intact and respected by Senior staff, no chance of foreign body remaining - We will tightly bandaged this to level of the heal with epithelialize, patient will then Follow-up in 2 days for wound check - Very strict return precautions given patient voiced understanding, patient allowed to return to work but urged to use caution - UDS neg  No orders of the defined types were placed in this encounter.   Return in about 2 days (around 06/14/2022) for wound check.  Haydee Salter, MD

## 2022-06-26 ENCOUNTER — Telehealth: Payer: Self-pay | Admitting: Family Medicine

## 2022-06-26 NOTE — Telephone Encounter (Signed)
Patient states he is still not feeling well and needs a note writing him off work for another week. Please advise.

## 2022-06-26 NOTE — Telephone Encounter (Signed)
Letter printed.

## 2022-06-26 NOTE — Telephone Encounter (Signed)
Okay for extended note?

## 2022-06-28 ENCOUNTER — Telehealth (HOSPITAL_COMMUNITY): Payer: Self-pay | Admitting: Emergency Medicine

## 2022-06-28 NOTE — Telephone Encounter (Signed)
[name] returned my call.  Discussed his current issue with being out of work.  He incurred injury to one of his fingers on his job.  He is now out of work due to same.  Scheduled visit for tomorrow and he will meet me at my office at 4:00p.m.    Beatrix Shipper, EMT-Paramedic [phone] 06/28/2022

## 2022-06-29 ENCOUNTER — Telehealth (HOSPITAL_COMMUNITY): Payer: Self-pay | Admitting: Cardiology

## 2022-06-29 ENCOUNTER — Other Ambulatory Visit (HOSPITAL_COMMUNITY): Payer: Self-pay | Admitting: Emergency Medicine

## 2022-06-29 ENCOUNTER — Telehealth (HOSPITAL_COMMUNITY): Payer: Self-pay | Admitting: Emergency Medicine

## 2022-06-29 MED ORDER — ENTRESTO 97-103 MG PO TABS: 1.0000 | ORAL_TABLET | Freq: Two times a day (BID) | ORAL | 11 refills | Status: DC

## 2022-06-29 NOTE — Progress Notes (Addendum)
Pt had an appointment today and was NO SHOW.  I called him to see if he was coming and he said something unexpected had come up.  Rescheduled for next week 12/6 during his clinic visit with Dr. Gala Romney.     Beatrix Shipper, EMT-Paramedic [phone] 06/29/2022

## 2022-06-29 NOTE — Telephone Encounter (Signed)
Entresto refill per [facility] paramedic

## 2022-06-29 NOTE — Telephone Encounter (Signed)
Contacted [facility] to request refills on Mitchell [name]'s Entresto to be sent to [facility]. [phone].  On the phone at same time w/ Mitchell [name] and advised the refill will be called in for him to pick up.    Beatrix Shipper, EMT-Paramedic [phone] 06/29/2022

## 2022-07-05 ENCOUNTER — Ambulatory Visit (HOSPITAL_COMMUNITY)
Admission: RE | Admit: 2022-07-05 | Discharge: 2022-07-05 | Disposition: A | Discharge status: Home / Self Care | Payer: No Typology Code available for payment source | Source: Ambulatory Visit | Attending: Internal Medicine | Admitting: Internal Medicine

## 2022-07-05 ENCOUNTER — Encounter (HOSPITAL_COMMUNITY): Payer: Self-pay | Admitting: Internal Medicine

## 2022-07-05 VITALS — BP 108/68 | HR 77 | Wt 283.2 lb

## 2022-07-05 DIAGNOSIS — E119 Type 2 diabetes mellitus without complications: Secondary | ICD-10-CM | POA: Diagnosis not present

## 2022-07-05 DIAGNOSIS — I428 Other cardiomyopathies: Secondary | ICD-10-CM | POA: Diagnosis not present

## 2022-07-05 DIAGNOSIS — I493 Ventricular premature depolarization: Secondary | ICD-10-CM | POA: Insufficient documentation

## 2022-07-05 DIAGNOSIS — I2510 Atherosclerotic heart disease of native coronary artery without angina pectoris: Secondary | ICD-10-CM | POA: Diagnosis not present

## 2022-07-05 DIAGNOSIS — I110 Hypertensive heart disease with heart failure: Secondary | ICD-10-CM | POA: Insufficient documentation

## 2022-07-05 DIAGNOSIS — Z6839 Body mass index (BMI) 39.0-39.9, adult: Secondary | ICD-10-CM | POA: Insufficient documentation

## 2022-07-05 DIAGNOSIS — Z89511 Acquired absence of right leg below knee: Secondary | ICD-10-CM | POA: Insufficient documentation

## 2022-07-05 DIAGNOSIS — I739 Peripheral vascular disease, unspecified: Secondary | ICD-10-CM | POA: Diagnosis not present

## 2022-07-05 DIAGNOSIS — I10 Essential (primary) hypertension: Secondary | ICD-10-CM | POA: Diagnosis not present

## 2022-07-05 DIAGNOSIS — I5022 Chronic systolic (congestive) heart failure: Secondary | ICD-10-CM | POA: Diagnosis not present

## 2022-07-05 DIAGNOSIS — E669 Obesity, unspecified: Secondary | ICD-10-CM | POA: Insufficient documentation

## 2022-07-05 DIAGNOSIS — Z794 Long term (current) use of insulin: Secondary | ICD-10-CM | POA: Diagnosis not present

## 2022-07-05 DIAGNOSIS — Z7902 Long term (current) use of antithrombotics/antiplatelets: Secondary | ICD-10-CM | POA: Diagnosis not present

## 2022-07-05 DIAGNOSIS — G4733 Obstructive sleep apnea (adult) (pediatric): Secondary | ICD-10-CM | POA: Diagnosis not present

## 2022-07-05 DIAGNOSIS — Z79899 Other long term (current) drug therapy: Secondary | ICD-10-CM | POA: Insufficient documentation

## 2022-07-05 DIAGNOSIS — E785 Hyperlipidemia, unspecified: Secondary | ICD-10-CM | POA: Insufficient documentation

## 2022-07-05 DIAGNOSIS — Z8673 Personal history of transient ischemic attack (TIA), and cerebral infarction without residual deficits: Secondary | ICD-10-CM | POA: Insufficient documentation

## 2022-07-05 DIAGNOSIS — I252 Old myocardial infarction: Secondary | ICD-10-CM | POA: Insufficient documentation

## 2022-07-05 DIAGNOSIS — Z7982 Long term (current) use of aspirin: Secondary | ICD-10-CM | POA: Insufficient documentation

## 2022-07-05 LAB — BASIC METABOLIC PANEL
Anion gap: 9 (ref 5–15)
BUN: 29 mg/dL — ABNORMAL HIGH (ref 6–20)
CO2: 24 mmol/L (ref 22–32)
Calcium: 8.9 mg/dL (ref 8.9–10.3)
Chloride: 105 mmol/L (ref 98–111)
Creatinine, Ser: 1.69 mg/dL — ABNORMAL HIGH (ref 0.61–1.24)
GFR, Estimated: 48 mL/min — ABNORMAL LOW (ref >60)
Glucose, Bld: 349 mg/dL — ABNORMAL HIGH (ref 70–99)
Potassium: 4.7 mmol/L (ref 3.5–5.1)
Sodium: 138 mmol/L (ref 135–145)

## 2022-07-05 LAB — BRAIN NATRIURETIC PEPTIDE: B Natriuretic Peptide: 136 pg/mL — ABNORMAL HIGH (ref 0.0–100.0)

## 2022-07-05 NOTE — Patient Instructions (Signed)
It was great to see you today! No medication changes are needed at this time.  Labs today We will only contact you if something comes back abnormal or we need to make some changes. Otherwise no news is good news!  Your physician wants you to follow-up in: 6 months  in the [facility] Practition[facility] (PA/NP) Clinic with a echo. You will receive a reminder letter in the mail two months in advance. If you don't receive a letter, please call our office to schedule the follow-up appointment.   Your physician has requested that you have an echocardiogram. Echocardiography is a painless test that uses sound waves to create images of your heart. It provides your doctor with information about the size and shape of your heart and how well your heart's chambers and valves are working. This procedure takes approximately one hour. There are no restrictions for this procedure. Please do NOT wear cologne, perfume, aftershave, or lotions (deodorant is allowed). Please arrive 15 minutes prior to your appointment time.  At the [facility], you and your health needs are our priority. As part of our continuing mission to provide you with exceptional heart care, we have created designated Provider Care Teams. These Care Teams include your primary Cardiologist (physician) and Advanced Practice Providers (APPs- Physician Assistants and Nurse Practitioners) who all work together to provide you with the care you need, when you need it.   You may see any of the following providers on your designated Care Team at your next follow up: Dr Arvilla Meres Dr Marca Ancona Dr. Marcos Eke, NP Robbie Lis, [place] [facility] [place], [place] Brynda Peon, NP Karle Plumber, PharmD   Please be sure to bring in all your medications bottles to every appointment.    Do the following things EVERYDAY: Weigh yourself in the morning before breakfast. Write it down and keep it in a log. Take  your medicines as prescribed Eat low salt foods--Limit salt (sodium) to 2000 mg per day.  Stay as active as you can everyday Limit all fluids for the day to less than 2 liters

## 2022-07-05 NOTE — Addendum Note (Signed)
Encounter addended by: Theresia Bough, CMA on: 07/05/2022 3:43 PM  Actions taken: Order list changed, Diagnosis association updated, Clinical Note Signed

## 2022-07-05 NOTE — Progress Notes (Signed)
 Advanced Heart Failure Clinic Note  PCP: Jessica [name], MD HF Cardiologist: Dr.   HPI: Mitchell [name] is a 51 y.o.male with multiple medical problems HFrEF, mixed ischemic/nonischemic CM, 3v CAD, obesity, hypertension, type II DM, obstructive sleep apnea, bipolar disease/schizophrenia, prior CVA.    Had R/LHC on 09/26/17 with 3v disease and preserved CO/CI. No revascularization options. Saw EP about ICD but did not f/u.   Patient admitted June 2022 with sepsis secondary to RLE abscess/cellulitis and COVID-19. He ended up requiring R BKA.  Readmitted 04/11/21 with a/c HFrEF and NSTEMI.  He was diuresed with IV lasix and started on GDMT. L/RHC with severe 3 vessel CAD (no targets for intervention), RA 4, PA 49/17 (31), PCW 15, Fick CO/index 6.7/2.7. He was referred to paramedicine program at discharge. Visual impairment felt to be contributor to issues with medication compliance.  Echo 2/23, EF 30-35%, RV ok. IVC not dilated. Seen at Urgent Care 2/23 and treated for simple UTI, UA+, CT w/ no evidence of pyelo or kidney stone.  Follow up 5/23, doing well, volume ok. Referred for ICD.   Seen in ED 12/08/21 for near syncope, ? hypoglycemic episode vs arrhythmia. Frequent PVCs on tele and Zio AT 14 day placed to quantify. Patient requested to schedule ICD implant.  S/p MDT ICD 6/23.  Seen in ED 03/05/22 for SOB, given IV lasix and then left. Post ED follow up 8/23, he remained volume up and several med discrepancies. Started torsemide 60 mg daily but later decreased to 20 mg daily with increase in SCr. Re-enrolled in paramedicine.  Today he returns for HF follow. Had work accident on 06/12/22 and cut R middle finger severely so has been out of work - went back to day. Denies CP or SOB. No edema, orthopnea or PND. Compliant with meds. Follows with paramedicine ([facility])    Cardiac Testing: - R/LHC (9/22):   Prox LAD to Mid LAD lesion is 80% stenosed.   Dist LAD lesion is 99%  stenosed.   Dist RCA lesion is 90% stenosed.   Ost 1st Mrg lesion is 95% stenosed.   1st Mrg lesion is 99% stenosed.   Ost 2nd Mrg to 2nd Mrg lesion is 99% stenosed.   Ost RPDA to RPDA lesion is 90% stenosed.   RPAV lesion is 95% stenosed.   Mid LM lesion is 20% stenosed.  Findings: Ao = 131/84 (107) LV = 131/22 RA = 4 RV = 49/11 PA = 49/17 (31) PCW = 15 Fick cardiac output/index = 6.7/2.7 PVR = 2.4 FA sat = 95% PA sat = 56%, 63%  Assessment: Severe 3v diffuse CAD with moderate progression from previous Ischemic CM EF 25% Mild pulmonary venous HTN with preserved cardiac output   - Echo (6/22): EF 20-25%, mild LVH, trivial MR  ROS: All systems negative except as listed in HPI, PMH and Problem List.  SH:  Social History   Socioeconomic History   Marital status: Married    Spouse name: Not on file   Number of children: 5   Years of education: Not on file   Highest education level: Not on file  Occupational History   Occupation: Mortician  Tobacco Use   Smoking status: Never   Smokeless tobacco: Never  Vaping Use   Vaping Use: Never used  Substance and Sexual Activity   Alcohol use: No   Drug use: No   Sexual activity: Not on file  Other Topics Concern   Not on file  Social History   Narrative   MARRIED, LIVES IN [place] WITH WIFE; GREW UP IN [place] AND USED TO BE A COOK; HE ENJOYS COOKING AND ENJOYS EATING A LOT OF PORK AND SALT.   Social Determinants of Health   Financial Resource Strain: Medium Risk (04/13/2021)   Overall Financial Resource Strain (CARDIA)    Difficulty of Paying Living Expenses: Somewhat hard  Food Insecurity: No Food Insecurity (04/13/2021)   Hunger Vital Sign    Worried About Running Out of Food in the Last Year: Never true    Ran Out of Food in the Last Year: Never true  Transportation Needs: No Transportation Needs (04/13/2021)   PRAPARE - Transportation    Lack of Transportation (Medical): No    Lack of Transportation  (Non-Medical): No  Physical Activity: Not on file  Stress: Not on file  Social Connections: Not on file  Intimate Partner Violence: Not on file   FH:  Family History  Problem Relation Age of Onset   Heart disease Mother        MI   Heart failure Mother    Diabetes Mother        ALSO IN MOST OF HIS SIBLINGS; 2 UNLCES HAVE ALSO PASSED AWAY FROM DM   Cardiomyopathy Mother    Cancer - Ovarian Mother    Ovarian cancer Mother    Heart disease Father    Hypertension Father    Diabetes Father    Diabetes Sister    Diabetes Brother    Colon cancer Paternal Uncle    Colon cancer Paternal Uncle    Colon polyps Neg Hx    Esophageal cancer Neg Hx    Rectal cancer Neg Hx    Stomach cancer Neg Hx    Past Medical History:  Diagnosis Date   Bipolar disorder (HCC)    CAD (coronary artery disease)    a. diffuse 3v CAD by cath 2019, medical therapy recommended.   Cataract    forming    Chronic systolic CHF (congestive heart failure) (HCC)    CVA (cerebral infarction)    No residual deficits   Depression    PTSD,    Diabetes mellitus    TYPE II; UNCONTROLLED BY HEMOGLOBIN A1c; STABLE AS  PER DISCHARGE   Headache(784.0)    Herpes simplex of male genitalia    History of colonic polyps    Hyperlipidemia    Hypertension    Myocardial infarction (HCC) 1987   (while playing football)   Obesity    OSA (obstructive sleep apnea)    repeat study 2018 without significant OSA   Pneumonia    Post-cardiac injury syndrome (HCC)    History of cardiac injury from blunt trauma   Pulmonary hypertension (HCC)    a. moderately elevated PASP 07/2019.   PVCs (premature ventricular contractions)    Schizophrenia (HCC)    Goes to [facility]   Sleep apnea    Stroke (HCC) 2005   some left side weakness   Syncope    Recurrent, thought to be vasovagal. Also has h/o frequent PVCs.    Current Outpatient Medications  Medication Sig Dispense Refill   Accu-Chek Softclix Lancets  lancets Use to test blood sugars up to 4 times daily as needed. 100 each 0   acetaminophen-codeine (TYLENOL #3) 300-30 MG tablet Take 1 tablet by mouth every 6 (six) hours as needed for moderate pain. 30 tablet 0   Ascorbic Acid (VITAMIN C PO) Take 1 tablet by mouth daily.       aspirin 81 MG EC tablet Take 1 tablet (81 mg total) by mouth daily. 30 tablet 12   blood glucose meter kit and supplies Dispense based on patient and insurance preference. Use up to four times daily as directed. (FOR ICD-10 E10.9, E11.9). 1 each 0   Blood Glucose Monitoring Suppl (BLOOD GLUCOSE MONITOR SYSTEM) w/Device KIT Use to test blood sugar up to 4 times daily as needed. 1 kit 0   carvedilol (COREG) 6.25 MG tablet TAKE 1 TABLET BY MOUTH TWICE DAILY WITH A MEAL 180 tablet 3   Cholecalciferol (VITAMIN D-3 PO) Take 1 capsule by mouth daily.     clopidogrel (PLAVIX) 75 MG tablet Take 1 tablet by mouth once daily 90 tablet 3   glucose blood (ACCU-CHEK GUIDE) test strip Use to test blood sugars up to 4 times daily as needed. 100 each 0   insulin glargine (LANTUS) 100 UNIT/ML injection Inject 48 Units into the skin daily.     methocarbamol (ROBAXIN) 500 MG tablet Take 1 tablet (500 mg total) by mouth every 6 (six) hours as needed for muscle spasms. 30 tablet 0   oxyCODONE (OXY IR/ROXICODONE) 5 MG immediate release tablet Take 0.5-1 tablets (2.5-5 mg total) by mouth every 6 (six) hours as needed for severe pain. 30 tablet 0   pantoprazole (PROTONIX) 40 MG tablet Take 1 tablet (40 mg total) by mouth daily as needed (acid reflux). 30 tablet 6   potassium chloride SA (KLOR-CON M) 20 MEQ tablet Take 1 tablet (20 mEq total) by mouth daily. 90 tablet 2   rosuvastatin (CRESTOR) 40 MG tablet Take 1 tablet by mouth once daily 90 tablet 3   sacubitril-valsartan (ENTRESTO) 97-103 MG Take 1 tablet by mouth 2 (two) times daily. 60 tablet 11   spironolactone (ALDACTONE) 25 MG tablet Take 1 tablet by mouth once daily 90 tablet 3   torsemide  (DEMADEX) 20 MG tablet Take 2 tablets (40 mg total) by mouth daily. 180 tablet 11   amitriptyline (ELAVIL) 10 MG tablet TAKE 1 TABLET BY MOUTH AT BEDTIME (Patient not taking: Reported on 04/25/2022) 30 tablet 0   No current facility-administered medications for this encounter.   Wt Readings from Last 3 Encounters:  07/05/22 128.5 kg (283 lb 3.2 oz)  06/15/22 126.4 kg (278 lb 9.6 oz)  06/12/22 131.5 kg (290 lb)   BP 108/68   Pulse 77   Wt 128.5 kg (283 lb 3.2 oz)   SpO2 98%   BMI 39.50 kg/m   PHYSICAL EXAM: General:  Well appearing. No resp difficulty HEENT: normal Neck: supple. no JVD. Carotids 2+ bilat; no bruits. No lymphadenopathy or thryomegaly appreciated. Cor: PMI nondisplaced. Regular rate & rhythm. No rubs, gallops or murmurs. Lungs: clear Abdomen: obese soft, nontender, nondistended. No hepatosplenomegaly. No bruits or masses. Good bowel sounds. Extremities: no cyanosis, clubbing, rash, edema  bandage on R middle finger s/p R BKA Neuro: alert & orientedx3, cranial nerves grossly intact. moves all 4 extremities w/o difficulty. Affect pleasant   Device interrogation (personally reviewed): Optivol ok. No VT/AF. Activity level 1hr/day Personally reviewed   ASSESSMENT & PLAN:  Chronic Systolic HF /Mixed ischemic/nonischemic cardiomyopathy  - Echo (6/22): EF 20 to 25%, stable from prior exams dating back to 2015  - R/LHC (9/22): Severe 3v CAD (no targets for revascularization), mild pulm venous HTN with preserved CO. - Echo (2/23): EF 30-35%, RV normal. IVC not dilated  - He has LBBB but QRS <150 ms. Not candidate for CRT-D.  - s/p   MDT ICD - NYHA II stable. Volume status  - Continue torsemide 40 mg daily. - Continue Coreg 6.25 mg bid. - Continue Entresto 97/103 mg bid, stressed he needs to take bid. - Continue spiro 25 mg daily. - He is off Farxiga due to nausea, and does not want to re-challenge. - Labs today   2. Frequent PVCs - Zio AT 5/23 showed mostly NSR, no  high grade arrhythmias. PVCs 3.8% - Asymptomatic. - Labs today.   3. CAD/NSTEMI 09/22 - Cath with progressive 3v CAD as above. No targets for intervention.  - No s/s angina  - Continue DAPT with aspirin + plavix, statin and beta blocker.    4. Hypertension - Blood pressure well controlled. Continue current regimen.   5. Hyperlipidemia  - Continue statin. - No changes.   6. DM2 - Last A1c 7.8  - On insulin, management per PCP.   7. PVD s/p R BKA  - Diabetic foot wound complicated by sepsis that led to amputation on 01/25/2021 - Continue aspirin and statin.   8. SDOH - Continue paramedicine to help with medication compliance.    , MD 07/05/22 

## 2022-07-09 ENCOUNTER — Emergency Department (HOSPITAL_BASED_OUTPATIENT_CLINIC_OR_DEPARTMENT_OTHER)
Admission: EM | Admit: 2022-07-09 | Discharge: 2022-07-09 | Disposition: A | Discharge status: Home / Self Care | Payer: No Typology Code available for payment source | Attending: Emergency Medicine | Admitting: Emergency Medicine

## 2022-07-09 ENCOUNTER — Emergency Department (HOSPITAL_BASED_OUTPATIENT_CLINIC_OR_DEPARTMENT_OTHER): Payer: No Typology Code available for payment source

## 2022-07-09 ENCOUNTER — Emergency Department (HOSPITAL_BASED_OUTPATIENT_CLINIC_OR_DEPARTMENT_OTHER): Payer: No Typology Code available for payment source | Admitting: Radiology

## 2022-07-09 ENCOUNTER — Other Ambulatory Visit: Payer: Self-pay

## 2022-07-09 ENCOUNTER — Encounter (HOSPITAL_BASED_OUTPATIENT_CLINIC_OR_DEPARTMENT_OTHER): Payer: Self-pay | Admitting: Emergency Medicine

## 2022-07-09 DIAGNOSIS — Z1152 Encounter for screening for COVID-19: Secondary | ICD-10-CM | POA: Diagnosis not present

## 2022-07-09 DIAGNOSIS — Z7902 Long term (current) use of antithrombotics/antiplatelets: Secondary | ICD-10-CM | POA: Insufficient documentation

## 2022-07-09 DIAGNOSIS — Z794 Long term (current) use of insulin: Secondary | ICD-10-CM | POA: Insufficient documentation

## 2022-07-09 DIAGNOSIS — R059 Cough, unspecified: Secondary | ICD-10-CM | POA: Diagnosis present

## 2022-07-09 DIAGNOSIS — J101 Influenza due to other identified influenza virus with other respiratory manifestations: Secondary | ICD-10-CM | POA: Insufficient documentation

## 2022-07-09 DIAGNOSIS — Z7982 Long term (current) use of aspirin: Secondary | ICD-10-CM | POA: Diagnosis not present

## 2022-07-09 LAB — CBC
HCT: 42.4 % (ref 39.0–52.0)
Hemoglobin: 13.8 g/dL (ref 13.0–17.0)
MCH: 28.7 pg (ref 26.0–34.0)
MCHC: 32.5 g/dL (ref 30.0–36.0)
MCV: 88.1 fL (ref 80.0–100.0)
Platelets: 125 10*3/uL — ABNORMAL LOW (ref 150–400)
RBC: 4.81 MIL/uL (ref 4.22–5.81)
RDW: 13.2 % (ref 11.5–15.5)
WBC: 10.4 10*3/uL (ref 4.0–10.5)
nRBC: 0 % (ref 0.0–0.2)

## 2022-07-09 LAB — TROPONIN I (HIGH SENSITIVITY): Troponin I (High Sensitivity): 13 ng/L (ref <18)

## 2022-07-09 LAB — BASIC METABOLIC PANEL
Anion gap: 12 (ref 5–15)
BUN: 34 mg/dL — ABNORMAL HIGH (ref 6–20)
CO2: 25 mmol/L (ref 22–32)
Calcium: 9.4 mg/dL (ref 8.9–10.3)
Chloride: 101 mmol/L (ref 98–111)
Creatinine, Ser: 1.58 mg/dL — ABNORMAL HIGH (ref 0.61–1.24)
GFR, Estimated: 52 mL/min — ABNORMAL LOW (ref >60)
Glucose, Bld: 183 mg/dL — ABNORMAL HIGH (ref 70–99)
Potassium: 4.9 mmol/L (ref 3.5–5.1)
Sodium: 138 mmol/L (ref 135–145)

## 2022-07-09 LAB — RESP PANEL BY RT-PCR (FLU A&B, COVID) ARPGX2
Influenza A by PCR: POSITIVE — AB
Influenza B by PCR: NEGATIVE
SARS Coronavirus 2 by RT PCR: NEGATIVE

## 2022-07-09 MED ORDER — LORAZEPAM 1 MG PO TABS
0.5000 mg | ORAL_TABLET | Freq: Once | ORAL | Status: AC
  Administered 2022-07-09: 0.5 mg via SUBLINGUAL
  Filled 2022-07-09: qty 1

## 2022-07-09 MED ORDER — BENZONATATE 100 MG PO CAPS: 100.0000 mg | ORAL_CAPSULE | Freq: Three times a day (TID) | ORAL | 0 refills | Status: DC

## 2022-07-09 MED ORDER — OSELTAMIVIR PHOSPHATE 75 MG PO CAPS: 75.0000 mg | ORAL_CAPSULE | Freq: Two times a day (BID) | ORAL | 0 refills | Status: DC

## 2022-07-09 NOTE — Discharge Instructions (Signed)
Commend continued symptomatic management at home to include Tylenol and ibuprofen for pain and muscle aches and fever, continued oral rehydration with electrolyte containing solutions.  Additionally flu has been prescribed.  Return for any worsening of symptoms, inability to tolerate oral intake, worsening respiratory symptoms.

## 2022-07-09 NOTE — ED Triage Notes (Signed)
Cough non productive for about 3 days Sob, weakness, nausea

## 2022-07-09 NOTE — ED Notes (Signed)
Pt provided water for PO challenge. Water was left at bedside.

## 2022-07-09 NOTE — ED Provider Notes (Signed)
[facility] EMERGENCY DEPT Provider Note   CSN: [id number] Arrival date & time: 07/09/22  1544     History  Chief Complaint  Patient presents with   Cough    Mitchell [name] is a 51 y.o. male.   Cough Associated symptoms: shortness of breath      51 year old male presenting to the emergency department with roughly 3 days of an influenza-like illness.  He endorses cough that is nonproductive, generalized myalgias and weakness, mild nausea, no vomiting.  He endorses some dyspnea.  Feels hot and cold.  Decreased oral intake but overall tolerating fluids.  He denies any chest pain or abdominal pain.  Home Medications Prior to Admission medications   Medication Sig Start Date End Date Taking? Authorizing Provider  oseltamivir (TAMIFLU) 75 MG capsule Take 1 capsule (75 mg total) by mouth every 12 (twelve) hours. 07/09/22  Yes Regan Lemming, MD  Accu-Chek Softclix Lancets lancets Use to test blood sugars up to 4 times daily as needed. 12/08/21   [name], MD  acetaminophen-codeine (TYLENOL #3) 300-30 MG tablet Take 1 tablet by mouth every 6 (six) hours as needed for moderate pain. 06/15/22   [name], [name], MD  amitriptyline (ELAVIL) 10 MG tablet TAKE 1 TABLET BY MOUTH AT BEDTIME Patient not taking: Reported on 04/25/2022 12/12/21   Izora Ribas, MD  Ascorbic Acid (VITAMIN C PO) Take 1 tablet by mouth daily.    [provider]  aspirin 81 MG EC tablet Take 1 tablet (81 mg total) by mouth daily. 04/13/21   Danford, Suann Larry, MD  blood glucose meter kit and supplies Dispense based on patient and insurance preference. Use up to four times daily as directed. (FOR ICD-10 E10.9, E11.9). 06/15/22   [name], [name], MD  Blood Glucose Monitoring Suppl (BLOOD GLUCOSE MONITOR SYSTEM) w/Device KIT Use to test blood sugar up to 4 times daily as needed. 12/08/21   [name], MD  carvedilol (COREG) 6.25 MG tablet TAKE 1 TABLET BY MOUTH TWICE DAILY WITH  A MEAL 05/03/22   [name], [name], MD  Cholecalciferol (VITAMIN D-3 PO) Take 1 capsule by mouth daily.    [provider]  clopidogrel (PLAVIX) 75 MG tablet Take 1 tablet by mouth once daily 03/17/22   Bensimhon, Shaune Pascal, MD  glucose blood (ACCU-CHEK GUIDE) test strip Use to test blood sugars up to 4 times daily as needed. 12/08/21   [name], MD  insulin glargine (LANTUS) 100 UNIT/ML injection Inject 48 Units into the skin daily.    [provider]  methocarbamol (ROBAXIN) 500 MG tablet Take 1 tablet (500 mg total) by mouth every 6 (six) hours as needed for muscle spasms. 04/13/22   Shelda Pal, DO  oxyCODONE (OXY IR/ROXICODONE) 5 MG immediate release tablet Take 0.5-1 tablets (2.5-5 mg total) by mouth every 6 (six) hours as needed for severe pain. 04/13/22   Shelda Pal, DO  pantoprazole (PROTONIX) 40 MG tablet Take 1 tablet (40 mg total) by mouth daily as needed (acid reflux). 02/13/22   [name], [name], MD  potassium chloride SA (KLOR-CON M) 20 MEQ tablet Take 1 tablet (20 mEq total) by mouth daily. 05/05/22   Rafael Bihari, FNP  rosuvastatin (CRESTOR) 40 MG tablet Take 1 tablet by mouth once daily 03/17/22   Bensimhon, Shaune Pascal, MD  sacubitril-valsartan (ENTRESTO) 97-103 MG Take 1 tablet by mouth 2 (two) times daily. 06/29/22   Larey Dresser, MD  spironolactone (ALDACTONE) 25 MG  tablet Take 1 tablet by mouth once daily 03/17/22   Bensimhon, Shaune Pascal, MD  torsemide (DEMADEX) 20 MG tablet Take 2 tablets (40 mg total) by mouth daily. 04/19/22   Milford, Maricela Bo, FNP      Allergies    [name] [dapagliflozin], Nsaids, and Trulicity [dulaglutide]    Review of Systems   Review of Systems  Respiratory:  Positive for cough and shortness of breath.   All other systems reviewed and are negative.   Physical Exam Updated Vital Signs BP 118/66 (BP Location: Right Arm)   Pulse 92   Temp 98 F (36.7 C) (Oral)   Resp 18   SpO2 98%  Physical  Exam Vitals and nursing note reviewed.  Constitutional:      General: He is not in acute distress.    Appearance: He is well-developed.  HENT:     Head: Normocephalic and atraumatic.  Eyes:     Conjunctiva/sclera: Conjunctivae normal.  Cardiovascular:     Rate and Rhythm: Normal rate and regular rhythm.  Pulmonary:     Effort: Pulmonary effort is normal. No respiratory distress.     Breath sounds: Normal breath sounds.  Abdominal:     Palpations: Abdomen is soft.     Tenderness: There is no abdominal tenderness.  Musculoskeletal:        General: No swelling.     Cervical back: Neck supple.     Right lower leg: No edema.     Left lower leg: No edema.  Skin:    General: Skin is warm and dry.     Capillary Refill: Capillary refill takes less than 2 seconds.  Neurological:     Mental Status: He is alert.  Psychiatric:        Mood and Affect: Mood normal.     ED Results / Procedures / Treatments   Labs (all labs ordered are listed, but only abnormal results are displayed) Labs Reviewed  RESP PANEL BY RT-PCR (FLU A&B, COVID) ARPGX2 - Abnormal; Notable for the following components:      Result Value   Influenza A by PCR POSITIVE (*)    All other components within normal limits  BASIC METABOLIC PANEL - Abnormal; Notable for the following components:   Glucose, Bld 183 (*)    BUN 34 (*)    Creatinine, Ser 1.58 (*)    GFR, Estimated 52 (*)    All other components within normal limits  CBC - Abnormal; Notable for the following components:   Platelets 125 (*)    All other components within normal limits  TROPONIN I (HIGH SENSITIVITY)    EKG EKG Interpretation  Date/Time:  Sunday July 09 2022 16:01:47 EST Ventricular Rate:  94 PR Interval:  176 QRS Duration: 124 QT Interval:  406 QTC Calculation: 507 R Axis:   -62 Text Interpretation: Sinus rhythm with occasional Premature ventricular complexes Left axis deviation Minimal voltage criteria for LVH, may be normal  variant Prolonged QT Reconfirmed by Regan Lemming ([id number]) on 07/09/2022 5:50:03 PM  Radiology DG Chest Port 1 View  Result Date: 07/09/2022 CLINICAL DATA:  Cough, congestion. EXAM: PORTABLE CHEST 1 VIEW COMPARISON:  Chest radiograph dated April 11, 2022 FINDINGS: The heart is mildly enlarged. Single lead AICD in the right ventricle, unchanged. Lungs are clear without evidence of focal consolidation or pleural effusion. Moderate left glenohumeral osteoarthritis IMPRESSION: 1. No active disease. 2. Mild cardiomegaly. Electronically Signed   By: Keane Police D.O.   On: 07/09/2022 17:52  Procedures Procedures    Medications Ordered in ED Medications  LORazepam (ATIVAN) tablet 0.5 mg (0.5 mg Sublingual Given 07/09/22 1803)    ED Course/ Medical Decision Making/ A&P                           Medical Decision Making Amount and/or Complexity of Data Reviewed Labs: ordered. Radiology: ordered.  Risk Prescription drug management.    51 year old male presenting to the emergency department with roughly 3 days of an influenza-like illness.  He endorses cough that is nonproductive, generalized myalgias and weakness, mild nausea, no vomiting.  He endorses some dyspnea.  Feels hot and cold.  Decreased oral intake but overall tolerating fluids.  He denies any chest pain or abdominal pain.  Mitchell [name] is a 51 y.o. male who presents to the ED with a 3 day history of fever, rhinorrhea, and nasal congestion.  On my exam, the patient is well-appearing and well-hydrated.  The patient's lungs are clear to auscultation bilaterally. Additionally, the patient has a soft/non-tender abdomen, clear tympanic membranes, and no oropharyngeal exudates.  There are no signs of meningismus.  I see no signs of an acute bacterial infection.  The patient's presentation is most consistent with a viral upper respiratory infection.  I have a low suspicion for pneumonia as the patient's cough has been  non-productive and the patient is neither tachypneic nor hypoxic on room air.  Additionally, the patient is CTAB.  Influenza/Influenza-Like Illness is possible, especially considering the current prevalence of disease. I discussed the risks and benefits of antiviral therapy. They do want to pursue antiviral therapy.  The patient has IDSA-identified high-risk features (being hospitalized, pregnant or within 2 weeks postpartum, <2 or >65 YO, immunocompromised, other severe/progressive systemic disease) , therefore I believe that antiviral therapy is indicated.  Influenza testing was obtained. The patient is POSITIVE for influenza.  Given the patient's history of prolonged QTC, sublingual Ativan was provided for nausea.  The patient successfully passed a p.o. challenge.  Laboratory evaluation revealed a creatinine at baseline at 1.58, CBC without leukocytosis or anemia, single troponin normal.  Low concern for ACS.  Chest x-ray performed without evidence of focal consolidation to suggest pneumonia, cardiomegaly present.  The patient has no lower extremity edema or JVD suggest CHF exacerbation.  Overall symptoms are consistent with likely influenza infection.    I discussed symptomatic management, including hydration, motrin, and tylenol. The patient/family felt safe being discharged from the ED.  They agreed to followup with the PCP if needed.  I provided ED return precautions.   Final Clinical Impression(s) / ED Diagnoses Final diagnoses:  Influenza A    Rx / DC Orders ED Discharge Orders          Ordered    oseltamivir (TAMIFLU) 75 MG capsule  Every 12 hours        07/09/22 1834              Regan Lemming, MD 07/09/22 1837

## 2022-07-10 ENCOUNTER — Ambulatory Visit (INDEPENDENT_AMBULATORY_CARE_PROVIDER_SITE_OTHER): Payer: No Typology Code available for payment source | Admitting: Orthopedic Surgery

## 2022-07-10 ENCOUNTER — Encounter: Payer: Self-pay | Admitting: Orthopedic Surgery

## 2022-07-10 DIAGNOSIS — Z89511 Acquired absence of right leg below knee: Secondary | ICD-10-CM

## 2022-07-10 NOTE — Progress Notes (Signed)
Office Visit Note   Patient: Mitchell [name]           Date of Birth: 1970-10-11           MRN: [id number] Visit Date: 07/10/2022              Requested by: Pearline Cables, MD [address] [place],  [place] [address] PCP: Pearline Cables, MD  Chief Complaint  Patient presents with   Right Leg - Follow-up    Hx right BKA 01/25/2022      HPI: Patient is a 51 year old gentleman who is seen in follow-up for right transtibial amputation.  Patient has been to [facility] for socket adjustments.  Patient was in the emergency room last night with test positive for the flu.  Patient complains of stomach and head pain.  Assessment & Plan: Visit Diagnoses:  1. S/P below knee amputation, right (HCC)     Plan: Patient was provided a note to be out of work for a week.  Will reevaluate the residual limb in 3 months.  Follow-Up Instructions: Return in about 3 months (around 10/09/2022).   Ortho Exam  Patient is alert, oriented, no adenopathy, well-dressed, normal affect, normal respiratory effort. Examination patient has a well-fitting prosthesis no ulcers no cellulitis of the residual limb.  Patient is symptomatic from the flu.  Imaging: DG Chest Port 1 View  Result Date: 07/09/2022 CLINICAL DATA:  Cough, congestion. EXAM: PORTABLE CHEST 1 VIEW COMPARISON:  Chest radiograph dated April 11, 2022 FINDINGS: The heart is mildly enlarged. Single lead AICD in the right ventricle, unchanged. Lungs are clear without evidence of focal consolidation or pleural effusion. Moderate left glenohumeral osteoarthritis IMPRESSION: 1. No active disease. 2. Mild cardiomegaly. Electronically Signed   By: Larose Hires D.O.   On: 07/09/2022 17:52   No images are attached to the encounter.  Labs: Lab Results  Component Value Date   HGBA1C 11.8 (H) 06/15/2022   HGBA1C 7.8 (A) 08/19/2021   HGBA1C 7.9 (A) 06/08/2021   ESRSEDRATE 20 (H) 03/08/2022   ESRSEDRATE 81 (H) 10/21/2019    ESRSEDRATE 25 (H) 06/17/2018   CRP 10.3 (H) 01/28/2021   CRP 12.8 (H) 01/27/2021   CRP 21.1 (H) 01/26/2021   REPTSTATUS 01/25/2021 FINAL 01/24/2021   GRAMSTAIN  08/29/2018    RARE WBC PRESENT, PREDOMINANTLY PMN NO ORGANISMS SEEN    CULT  01/24/2021    NO GROWTH Performed at [facility], [address], [place], [place] [address]    LABORGA KLEBSIELLA PNEUMONIAE 05/21/2018   LABORGA ENTEROBACTER SPECIES 05/21/2018   LABORGA STAPHYLOCOCCUS AUREUS 05/21/2018     Lab Results  Component Value Date   ALBUMIN 3.8 03/15/2022   ALBUMIN 3.6 01/12/2022   ALBUMIN 3.6 12/08/2021   PREALBUMIN 6.8 (L) 10/21/2019    Lab Results  Component Value Date   MG 2.3 11/29/2021   MG 1.5 (L) 04/12/2021   MG 1.8 02/16/2021   Lab Results  Component Value Date   VD25OH 11 (L) 08/16/2009    Lab Results  Component Value Date   PREALBUMIN 6.8 (L) 10/21/2019      Latest Ref Rng & Units 07/09/2022    3:55 PM 06/12/2022    9:41 PM 03/27/2022    3:31 AM  CBC EXTENDED  WBC 4.0 - 10.5 K/uL 10.4  8.9  14.8   RBC 4.22 - 5.81 MIL/uL 4.81  4.11  3.75   Hemoglobin 13.0 - 17.0 g/dL 13.8  12.0  11.2   HCT 39.0 - 52.0 % 42.4  36.2  34.4   Platelets 150 - 400 K/uL 125  120  185   NEUT# 1.7 - 7.7 K/uL   12.9   Lymph# 0.7 - 4.0 K/uL   0.8      There is no height or weight on file to calculate BMI.  Orders:  No orders of the defined types were placed in this encounter.  No orders of the defined types were placed in this encounter.    Procedures: No procedures performed  Clinical Data: No additional findings.  ROS:  All other systems negative, except as noted in the HPI. Review of Systems  Objective: Vital Signs: There were no vitals taken for this visit.  Specialty Comments:  No specialty comments available.  PMFS History: Patient Active Problem List   Diagnosis Date Noted   ICD (implantable cardioverter-defibrillator) in place 04/19/2022   Near syncope 12/08/2021   CKD  (chronic kidney disease) stage 3, GFR 30-59 ml/min (HCC) 12/08/2021   PVD s/p R LE amputation  12/08/2021   Diabetic retinopathy associated with type 2 diabetes mellitus (HCC) 06/06/2021   CAD (coronary artery disease) with flat troponin  04/11/2021   S/P below knee amputation, right (HCC) 02/07/2021   QT prolongation 07/30/2019   Acute on chronic combined systolic and diastolic CHF (congestive heart failure) (HCC) 07/29/2019   Stroke (HCC) 05/08/2017   Uncontrolled type 2 diabetes mellitus with hyperglycemia, with long-term current use of insulin (HCC) 12/07/2015   Chronic systolic heart failure (HCC) 10/02/2011   Erectile dysfunction 10/02/2011   Obstructive sleep apnea 10/11/2007   HLD (hyperlipidemia) 10/10/2007   HYPOKALEMIA 10/10/2007   Obesity, Class III, BMI 40-49.9 (morbid obesity) (HCC) 10/10/2007   Essential hypertension 10/10/2007   PREMATURE VENTRICULAR CONTRACTIONS 10/10/2007   COLONIC POLYPS, HX OF 10/10/2007   Past Medical History:  Diagnosis Date   Bipolar disorder (HCC)    CAD (coronary artery disease)    a. diffuse 3v CAD by cath 2019, medical therapy recommended.   Cataract    forming    Chronic systolic CHF (congestive heart failure) (HCC)    CVA (cerebral infarction)    No residual deficits   Depression    PTSD,    Diabetes mellitus    TYPE II; UNCONTROLLED BY HEMOGLOBIN A1c; STABLE AS  PER DISCHARGE   Headache(784.0)    Herpes simplex of male genitalia    History of colonic polyps    Hyperlipidemia    Hypertension    Myocardial infarction (HCC) 1987   (while playing football)   Obesity    OSA (obstructive sleep apnea)    repeat study 2018 without significant OSA   Pneumonia    Post-cardiac injury syndrome (HCC)    History of cardiac injury from blunt trauma   Pulmonary hypertension (HCC)    a. moderately elevated PASP 07/2019.   PVCs (premature ventricular contractions)    Schizophrenia (HCC)    Go[facility] to [facility]   Sleep  apnea    Stroke [facility]) 2005   some left side weakness   Syncope    Recurrent, thought to be vasovagal. Also has h/o frequent PVCs.     Family History  Problem Relation Age of Onset   Heart disease Mother        MI   Heart failure Mother    Diabetes Mother        ALSO IN MOST OF HIS SIBLINGS; 2 UNLCES HAVE ALSO  PASSED AWAY FROM DM   Cardiomyopathy Mother    Cancer - Ovarian Mother    Ovarian cancer Mother    Heart disease Father    Hypertension Father    Diabetes Father    Diabetes Sister    Diabetes Brother    Colon cancer Paternal Uncle    Colon cancer Paternal Uncle    Colon polyps Neg Hx    Esophageal cancer Neg Hx    Rectal cancer Neg Hx    Stomach cancer Neg Hx     Past Surgical History:  Procedure Laterality Date   AMPUTATION Right 01/25/2021   Procedure: RIGHT BELOW KNEE AMPUTATION;  Surgeon: Nadara Mustard, MD;  Location: [facility] OR;  Service: Orthopedics;  Laterality: Right;   CARDIAC CATHETERIZATION  12/19/10   DIFFUSE NONOBSTRUCTIVE CAD; NONISCHEMIC CARDIOMYOPATHY; LEFT VENTRICULAR ANGIOGRAM WAS PERFORMED SECONDARY TO  ELEVATED LEFT VENTRICULAR FILLING PRESSURES   COLONOSCOPY     ~ age [age]   COLONOSCOPY W/ POLYPECTOMY     I & D EXTREMITY Bilateral 10/24/2019   Procedure: IRRIGATION AND DEBRIDEMENT BILATERAL EXTREMITY WOUND ON FOOT;  Surgeon: Allena Napoleon, MD;  Location: [facility] OR;  Service: Plastics;  Laterality: Bilateral;   ICD IMPLANT N/A 01/13/2022   Procedure: ICD IMPLANT;  Surgeon: Marinus Maw, MD;  Location: [facility] INVASIVE CV LAB;  Service: Cardiovascular;  Laterality: N/A;   METATARSAL HEAD EXCISION Right 08/29/2018   Procedure: METATARSAL HEAD RESECTION;  Surgeon: Felecia Shelling, DPM;  Location: [facility];  Service: Podiatry;  Laterality: Right;   METATARSAL OSTEOTOMY Right 08/29/2018   Procedure: SUB FIFTHE METATARSIA RIGHT FOOT;  Surgeon: Felecia Shelling, DPM;  [facility];  Service: Podiatry;  Laterality: Right;   MULTIPLE EXTRACTIONS WITH ALVEOLOPLASTY   01/27/2014   "all my teeth; 4 Quadrants of alveoloplasty   MULTIPLE EXTRACTIONS WITH ALVEOLOPLASTY N/A 01/27/2014   Procedure: EXTRACTION OF TEETH #'1, 2, 3, 4, 5, 6, 7, 8, 9, 10, 11, 12, 13, 14, 15, 16, 17, 20, 21, 22, 23, 24, 25, 26, 27, 28, 29, 31 and 32 WITH ALVEOLOPLASTY;  Surgeon: Charlynne Pander, DDS;  Location: [facility];  Service: Oral Surgery;  Laterality: N/A;   ORIF FINGER / THUMB FRACTURE Right    POLYPECTOMY     RIGHT HEART CATH N/A 01/27/2021   Procedure: RIGHT HEART CATH;  Surgeon: Dolores Patty, MD;  [facility]: [facility] INVASIVE CV LAB;  Service: Cardiovascular;  Laterality: N/A;   RIGHT/LEFT HEART CATH AND CORONARY ANGIOGRAPHY N/A 09/26/2017   Procedure: RIGHT/LEFT HEART CATH AND CORONARY ANGIOGRAPHY;  Surgeon: Dolores Patty, MD;  [facility] LAB;  Service: Cardiovascular;  Laterality: N/A;   RIGHT/LEFT HEART CATH AND CORONARY ANGIOGRAPHY N/A 04/12/2021   Procedure: RIGHT/LEFT HEART CATH AND CORONARY ANGIOGRAPHY;  Surgeon: Dolores Patty, MD;  [facility];  Service: Cardiovascular;  Laterality: N/A;   SKIN SPLIT GRAFT Bilateral 10/24/2019   Procedure: SKIN GRAFT SPLIT THICKNESS LEFT THIGH;  Surgeon: Allena Napoleon, MD;  [facility];  Service: Plastics;  Laterality: Bilateral;   WOUND DEBRIDEMENT Right 08/29/2018   Procedure: Debridement of ulcer on right fifth metatarsal;  Surgeon: Felecia Shelling, DPM;  [facility];  Service: Podiatry;  Laterality: Right;   Social History   Occupational History   Occupation: Mortician  Tobacco Use   Smoking status: Never   Smokeless tobacco: Never  Vaping Use   Vaping Use: Never used  Substance and Sexual Activity   Alcohol  use: No   Drug use: No   Sexual activity: Not on file

## 2022-07-17 ENCOUNTER — Ambulatory Visit (INDEPENDENT_AMBULATORY_CARE_PROVIDER_SITE_OTHER): Payer: No Typology Code available for payment source

## 2022-07-17 DIAGNOSIS — I5022 Chronic systolic (congestive) heart failure: Secondary | ICD-10-CM | POA: Diagnosis not present

## 2022-07-19 LAB — CUP PACEART REMOTE DEVICE CHECK
Battery Remaining Longevity: 129 mo
Battery Voltage: 3.02 V
Brady Statistic RV Percent Paced: 0 %
Date Time Interrogation Session: 20231218082603
HighPow Impedance: 51 Ohm
Implantable Lead Connection Status: 753985
Implantable Lead Implant Date: 20230616
Implantable Lead Location: 753860
Implantable Lead Model: 6935
Implantable Pulse Generator Implant Date: 20230616
Lead Channel Impedance Value: 342 Ohm
Lead Channel Impedance Value: 437 Ohm
Lead Channel Pacing Threshold Amplitude: 0.625 V
Lead Channel Pacing Threshold Pulse Width: 0.4 ms
Lead Channel Sensing Intrinsic Amplitude: 19.625 mV
Lead Channel Sensing Intrinsic Amplitude: 19.625 mV
Lead Channel Setting Pacing Amplitude: 2 V
Lead Channel Setting Pacing Pulse Width: 0.4 ms
Lead Channel Setting Sensing Sensitivity: 0.3 mV
Zone Setting Status: 755011

## 2022-07-23 NOTE — Progress Notes (Addendum)
[place] [address] [place], [place] [address] [phone]  Date:  07/26/2022   Name:  Mitchell [name]   DOB:  [birth year]   MRN:  [id number]  PCP:  [name], MD    Chief Complaint: Follow-up (Follow up )   History of Present Illness:  Mitchell [name] is a 51 y.o. very pleasant male patient who presents with the following:  Patient seen today for follow-up Most recent visit with myself was in November for finger injury History of CVA, schizophrenia versus bipolar disorder, OSA, obesity, CAD, HTN, hyperlipidemia, DM He was recently admitted from 6/26 through 7/11 of 2022; he had a complex hospital course including a right BKA.  He was then admitted to rehab and discharged home on 02/17/21  His diabetes has been poorly controlled-when I saw him back in November A1c was quite elevated as below At that time he was taking insulin glargine 21 units twice daily-we need to increase insulin but he did not have a meter.  I prescribed a meter and asked him to gradually titrate his insulin is on his fasting sugar greater than 150-200 Also, his endocrinologist has left our group-I placed a referral for a new endocrinologist but I am not sure if any progress has been made as of yet Needs microalbumin Eye exam COVID booster  Torsemide 40 daily Spironolactone 25 daily Entresto Crestor Potassium 20 mill equivalents daily Insulin glargine Carvedilol 6.25 twice daily Plavix Aspirin 81  He is now seeing ortho for his finger injury- he was seen by Dr Ala Bent   He needs a refill of his amitriptyline that he takes at bedtime  He also needs a refill of gabapentin He is taking 56 units of insulin daily- he notes his glucose was 120 this am No low glucose noted   He notes left sided abd pain - unclear how long this has been going on but it seems like at least 6 weeks  It will bother him daily for a while, then get  better for a while No vomiting or diarrhea  "It feels like water in there, or bubbles"   Lab Results  Component Value Date   HGBA1C 11.8 (H) 06/15/2022    Patient Active Problem List   Diagnosis Date Noted   ICD (implantable cardioverter-defibrillator) in place 04/19/2022   Near syncope 12/08/2021   CKD (chronic kidney disease) stage 3, GFR 30-59 ml/min ([place]) 12/08/2021   PVD s/p R LE amputation  12/08/2021   Diabetic retinopathy associated with type 2 diabetes mellitus ([place]) 06/06/2021   CAD (coronary artery disease) with flat troponin  04/11/2021   S/P below knee amputation, right ([place]) 02/07/2021   QT prolongation 07/30/2019   Acute on chronic combined systolic and diastolic CHF (congestive heart failure) ([place]) 07/29/2019   Stroke ([place]) 05/08/2017   Uncontrolled type 2 diabetes mellitus with hyperglycemia, with long-term current use of insulin ([place]) 59/16/3846   Chronic systolic heart failure ([place]) 10/02/2011   Erectile dysfunction 10/02/2011   Obstructive sleep apnea 10/11/2007   HLD (hyperlipidemia) 10/10/2007   HYPOKALEMIA 10/10/2007   Obesity, Class III, BMI 40-49.9 (morbid obesity) ([place]) 10/10/2007   Essential hypertension 10/10/2007   PREMATURE VENTRICULAR CONTRACTIONS 10/10/2007   COLONIC POLYPS, HX OF 10/10/2007    Past Medical History:  Diagnosis Date   Bipolar disorder ([place])    CAD (coronary artery disease)    a. diffuse 3v CAD by cath 2019, medical  therapy recommended.   Cataract    forming    Chronic systolic CHF (congestive heart failure) (HCC)    CVA (cerebral infarction)    No residual deficits   Depression    PTSD,    Diabetes mellitus    TYPE II; UNCONTROLLED BY HEMOGLOBIN A1c; STABLE AS  PER DISCHARGE   Headache(784.0)    Herpes simplex of male genitalia    History of colonic polyps    Hyperlipidemia    Hypertension    Myocardial infarction ([place]) 1987   (while playing football)   Obesity    OSA (obstructive sleep apnea)    repeat study 2018  without significant OSA   Pneumonia    Post-cardiac injury syndrome ([place])    History of cardiac injury from blunt trauma   Pulmonary hypertension ([place])    a. moderately elevated PASP 07/2019.   PVCs (premature ventricular contractions)    Schizophrenia ([place])    Goes to [facility]   Sleep apnea    Stroke [facility]) 2005   some left side weakness   Syncope    Recurrent, thought to be vasovagal. Also has h/o frequent PVCs.     Past Surgical History:  Procedure Laterality Date   AMPUTATION Right 01/25/2021   Procedure: RIGHT BELOW KNEE AMPUTATION;  Surgeon: [name], MD;  Location: [facility];  Service: Orthopedics;  Laterality: Right;   CARDIAC CATHETERIZATION  12/19/10   DIFFUSE NONOBSTRUCTIVE CAD; NONISCHEMIC CARDIOMYOPATHY; LEFT VENTRICULAR ANGIOGRAM WAS PERFORMED SECONDARY TO  ELEVATED LEFT VENTRICULAR FILLING PRESSURES   COLONOSCOPY     ~ age [age]   COLONOSCOPY W/ POLYPECTOMY     I & D EXTREMITY Bilateral 10/24/2019   Procedure: IRRIGATION AND DEBRIDEMENT BILATERAL EXTREMITY WOUND ON FOOT;  Surgeon: Cindra Presume, MD;  Location: [facility];  Service: Plastics;  Laterality: Bilateral;   ICD IMPLANT N/A 01/13/2022   Procedure: ICD IMPLANT;  Surgeon: Evans Lance, MD;  Location: [place] CV LAB;  Service: Cardiovascular;  Laterality: N/A;   METATARSAL HEAD EXCISION Right 08/29/2018   Procedure: METATARSAL HEAD RESECTION;  Surgeon: Edrick Kins, DPM;  Location: [place];  Service: Podiatry;  Laterality: Right;   METATARSAL OSTEOTOMY Right 08/29/2018   Procedure: SUB FIFTHE METATARSIA RIGHT FOOT;  Surgeon: Edrick Kins, DPM;  Location: [facility] [place];  Service: Podiatry;  Laterality: Right;   MULTIPLE EXTRACTIONS WITH ALVEOLOPLASTY  01/27/2014   "all my teeth; 4 Quadrants of alveoloplasty   MULTIPLE EXTRACTIONS WITH ALVEOLOPLASTY N/A 01/27/2014   Procedure: EXTRACTION OF TEETH #'1, 2, 3, 4, 5, 6, 7, 8, 9, 10, 11, 12, 13, 14, 15, 16, 17, 20, 21, 22, 23, 24, 25, 26, 27, 28, 29, 31  and 32 WITH ALVEOLOPLASTY;  Surgeon: Lenn Cal, DDS;  Location: [place];  Service: Oral Surgery;  Laterality: N/A;   ORIF FINGER / THUMB FRACTURE Right    POLYPECTOMY     RIGHT HEART CATH N/A 01/27/2021   Procedure: RIGHT HEART CATH;  Surgeon: Jolaine Artist, MD;  Location: [place] CV LAB;  Service: Cardiovascular;  Laterality: N/A;   RIGHT/LEFT HEART CATH AND CORONARY ANGIOGRAPHY N/A 09/26/2017   Procedure: RIGHT/LEFT HEART CATH AND CORONARY ANGIOGRAPHY;  Surgeon: Jolaine Artist, MD;  Location: [place] CV LAB;  Service: Cardiovascular;  Laterality: N/A;   RIGHT/LEFT HEART CATH AND CORONARY ANGIOGRAPHY N/A 04/12/2021   Procedure: RIGHT/LEFT HEART CATH AND CORONARY ANGIOGRAPHY;  Surgeon: Jolaine Artist, MD;  Location: [place] CV LAB;  Service: Cardiovascular;  Laterality: N/A;   SKIN SPLIT GRAFT Bilateral 10/24/2019   Procedure: SKIN GRAFT SPLIT THICKNESS LEFT THIGH;  Surgeon: Cindra Presume, MD;  Location: [facility];  Service: Plastics;  Laterality: Bilateral;   WOUND DEBRIDEMENT Right 08/29/2018   Procedure: Debridement of ulcer on right fifth metatarsal;  Surgeon: Edrick Kins, DPM;  Location: [place];  Service: Podiatry;  Laterality: Right;    Social History   Tobacco Use   Smoking status: Never   Smokeless tobacco: Never  Vaping Use   Vaping Use: Never used  Substance Use Topics   Alcohol use: No   Drug use: No    Family History  Problem Relation Age of Onset   Heart disease Mother        MI   Heart failure Mother    Diabetes Mother        ALSO IN MOST OF HIS SIBLINGS; 2 UNLCES HAVE ALSO PASSED AWAY FROM DM   Cardiomyopathy Mother    Cancer - Ovarian Mother    Ovarian cancer Mother    Heart disease Father    Hypertension Father    Diabetes Father    Diabetes Sister    Diabetes Brother    Colon cancer Paternal Uncle    Colon cancer Paternal Uncle    Colon polyps Neg Hx    Esophageal cancer Neg Hx    Rectal cancer Neg Hx    Stomach cancer Neg  Hx     Allergies  Allergen Reactions   Farxiga [Dapagliflozin] Other (See Comments)    Patient reports loss of consciousness   Nsaids Anaphylaxis and Rash    Able to tolerate aspirin    Trulicity [Dulaglutide] Nausea And Vomiting    Medication list has been reviewed and updated.  Current Outpatient Medications on File Prior to Visit  Medication Sig Dispense Refill   Accu-Chek Softclix Lancets lancets Use to test blood sugars up to 4 times daily as needed. 100 each 0   acetaminophen-codeine (TYLENOL #3) 300-30 MG tablet Take 1 tablet by mouth every 6 (six) hours as needed for moderate pain. 30 tablet 0   Ascorbic Acid (VITAMIN C PO) Take 1 tablet by mouth daily.     aspirin 81 MG EC tablet Take 1 tablet (81 mg total) by mouth daily. 30 tablet 12   benzonatate (TESSALON) 100 MG capsule Take 1 capsule (100 mg total) by mouth every 8 (eight) hours. 21 capsule 0   blood glucose meter kit and supplies Dispense based on patient and insurance preference. Use up to four times daily as directed. (FOR ICD-10 E10.9, E11.9). 1 each 0   Blood Glucose Monitoring Suppl (BLOOD GLUCOSE MONITOR SYSTEM) w/Device KIT Use to test blood sugar up to 4 times daily as needed. 1 kit 0   carvedilol (COREG) 6.25 MG tablet TAKE 1 TABLET BY MOUTH TWICE DAILY WITH A MEAL 180 tablet 3   Cholecalciferol (VITAMIN D-3 PO) Take 1 capsule by mouth daily.     glucose blood (ACCU-CHEK GUIDE) test strip Use to test blood sugars up to 4 times daily as needed. 100 each 0   insulin glargine (LANTUS) 100 UNIT/ML injection Inject 48 Units into the skin daily.     oxyCODONE (OXY IR/ROXICODONE) 5 MG immediate release tablet Take 0.5-1 tablets (2.5-5 mg total) by mouth every 6 (six) hours as needed for severe pain. 30 tablet 0   pantoprazole (PROTONIX) 40 MG tablet Take 1 tablet (40 mg total) by mouth daily as  needed (acid reflux). 30 tablet 6   potassium chloride SA (KLOR-CON M) 20 MEQ tablet Take 1 tablet (20 mEq total) by mouth  daily. 90 tablet 2   sacubitril-valsartan (ENTRESTO) 97-103 MG Take 1 tablet by mouth 2 (two) times daily. 60 tablet 11   torsemide (DEMADEX) 20 MG tablet Take 2 tablets (40 mg total) by mouth daily. 180 tablet 11   No current facility-administered medications on file prior to visit.    Review of Systems:  As per HPI- otherwise negative.   Physical Examination: Vitals:   07/26/22 1431  BP: 124/78  Pulse: 78  Resp: 16  Temp: (!) 97.5 F (36.4 C)  SpO2: 98%   Vitals:   07/26/22 1431  Weight: 282 lb 6.4 oz (128.1 kg)  Height: _0  (1.803 m)   Body mass index is 39.39 kg/m. Ideal Body Weight: Weight in (lb) to have BMI = 25: 178.9  GEN: no acute distress.  Obese, appears his normal self HEENT: Atraumatic, Normocephalic.  Ears and Nose: No external deformity. CV: RRR, No M/G/R. No JVD. No thrill. No extra heart sounds. PULM: CTA B, no wheezes, crackles, rhonchi. No retractions. No resp. distress. No accessory muscle use. ABD: S, NT, ND, +BS. No rebound. No HSM.  Belly is benign on exam today EXTR: No c/c/e PSYCH: Normally interactive. Conversant.  Status post right BKA  Assessment and Plan: Insomnia, unspecified type - Plan: amitriptyline (ELAVIL) 10 MG tablet  Acute neck pain - Plan: methocarbamol (ROBAXIN) 500 MG tablet  Acute bilateral low back pain without sciatica - Plan: methocarbamol (ROBAXIN) 500 MG tablet, gabapentin (NEURONTIN) 100 MG capsule  Left lateral abdominal pain - Plan: Comp Met (CMET), Lipase, DG Abd 2 Views  Patient seen today with a few concerns.  He notes he is out of his amitriptyline, gabapentin and methocarbamol.  He states he has been taking amitriptyline at bedtime for insomnia.  He also states he has been taking gabapentin regularly for chronic back pain, as I do not think we have filled this in a while. Advised him he can go ahead and take methocarbamol as needed, start amitriptyline at bedtime for least 1 to 2 weeks before adding back  gabapentin  Also, he notes abdominal pain as above.  Duration is somewhat uncertain, seems 6 to 8 weeks at least  Will obtain labs and a plain film of his abdomen today  Signed [name], MD  Received labs as below-labs are stable, await abdominal films and will be in touch with patient Results for orders placed or performed in visit on 07/26/22  Comp Met (CMET)  Result Value Ref Range   Sodium 141 135 - 145 mEq/L   Potassium 4.4 3.5 - 5.1 mEq/L   Chloride 104 96 - 112 mEq/L   CO2 31 19 - 32 mEq/L   Glucose, Bld 249 (H) 70 - 99 mg/dL   BUN 24 (H) 6 - 23 mg/dL   Creatinine, Ser 1.43 0.40 - 1.50 mg/dL   Total Bilirubin 0.3 0.2 - 1.2 mg/dL   Alkaline Phosphatase 68 39 - 117 U/L   AST 18 0 - 37 U/L   ALT 13 0 - 53 U/L   Total Protein 7.3 6.0 - 8.3 g/dL   Albumin 4.3 3.5 - 5.2 g/dL   GFR 55.76 (L) >60.00 mL/min   Calcium 9.3 8.4 - 10.5 mg/dL  Lipase  Result Value Ref Range   Lipase 35.0 11.0 - 59.0 U/L   Addendum 12/29, received patient's x-ray  report, gave him a call  EXAM: ABDOMEN - 2 VIEW   COMPARISON:  None Available.   FINDINGS: Moderate to severe fecal loading throughout the colon. No other bony or soft tissue abnormalities are noted.   IMPRESSION: Moderate to severe fecal loading throughout the colon.  Advised it appears he is constipated, this could explain his discomfort.  Recommended he try taking a dose of MiraLAX once daily for about 5 days.  Please let me know if this does not resolve his discomfort

## 2022-07-26 ENCOUNTER — Ambulatory Visit (HOSPITAL_BASED_OUTPATIENT_CLINIC_OR_DEPARTMENT_OTHER)
Admission: RE | Admit: 2022-07-26 | Discharge: 2022-07-26 | Disposition: A | Discharge status: Home / Self Care | Payer: No Typology Code available for payment source | Source: Ambulatory Visit | Attending: Family Medicine | Admitting: Family Medicine

## 2022-07-26 ENCOUNTER — Telehealth (HOSPITAL_COMMUNITY): Payer: Self-pay | Admitting: Emergency Medicine

## 2022-07-26 ENCOUNTER — Ambulatory Visit (INDEPENDENT_AMBULATORY_CARE_PROVIDER_SITE_OTHER): Payer: No Typology Code available for payment source | Admitting: Family Medicine

## 2022-07-26 ENCOUNTER — Other Ambulatory Visit (HOSPITAL_COMMUNITY): Payer: Self-pay | Admitting: *Deleted

## 2022-07-26 VITALS — BP 124/78 | HR 78 | Temp 97.5°F | Resp 16 | Ht 71.0 in | Wt 282.4 lb

## 2022-07-26 DIAGNOSIS — M545 Low back pain, unspecified: Secondary | ICD-10-CM

## 2022-07-26 DIAGNOSIS — R109 Unspecified abdominal pain: Secondary | ICD-10-CM

## 2022-07-26 DIAGNOSIS — G4700 Insomnia, unspecified: Secondary | ICD-10-CM

## 2022-07-26 DIAGNOSIS — M542 Cervicalgia: Secondary | ICD-10-CM | POA: Diagnosis not present

## 2022-07-26 LAB — COMPREHENSIVE METABOLIC PANEL
ALT: 13 U/L (ref 0–53)
AST: 18 U/L (ref 0–37)
Albumin: 4.3 g/dL (ref 3.5–5.2)
Alkaline Phosphatase: 68 U/L (ref 39–117)
BUN: 24 mg/dL — ABNORMAL HIGH (ref 6–23)
CO2: 31 mEq/L (ref 19–32)
Calcium: 9.3 mg/dL (ref 8.4–10.5)
Chloride: 104 mEq/L (ref 96–112)
Creatinine, Ser: 1.43 mg/dL (ref 0.40–1.50)
GFR: 55.76 mL/min — ABNORMAL LOW (ref >60.00)
Glucose, Bld: 249 mg/dL — ABNORMAL HIGH (ref 70–99)
Potassium: 4.4 mEq/L (ref 3.5–5.1)
Sodium: 141 mEq/L (ref 135–145)
Total Bilirubin: 0.3 mg/dL (ref 0.2–1.2)
Total Protein: 7.3 g/dL (ref 6.0–8.3)

## 2022-07-26 LAB — LIPASE: Lipase: 35 U/L (ref 11.0–59.0)

## 2022-07-26 MED ORDER — METHOCARBAMOL 500 MG PO TABS: 500.0000 mg | ORAL_TABLET | Freq: Four times a day (QID) | ORAL | 2 refills | Status: DC | PRN

## 2022-07-26 MED ORDER — AMITRIPTYLINE HCL 10 MG PO TABS: 10.0000 mg | ORAL_TABLET | Freq: Every day | ORAL | 2 refills | Status: DC

## 2022-07-26 MED ORDER — CLOPIDOGREL BISULFATE 75 MG PO TABS: 75.0000 mg | ORAL_TABLET | Freq: Every day | ORAL | 3 refills | Status: DC

## 2022-07-26 MED ORDER — SPIRONOLACTONE 25 MG PO TABS: 25.0000 mg | ORAL_TABLET | Freq: Every day | ORAL | 3 refills | Status: DC

## 2022-07-26 MED ORDER — ROSUVASTATIN CALCIUM 40 MG PO TABS: 40.0000 mg | ORAL_TABLET | Freq: Every day | ORAL | 3 refills | Status: DC

## 2022-07-26 MED ORDER — GABAPENTIN 100 MG PO CAPS: 100.0000 mg | ORAL_CAPSULE | Freq: Two times a day (BID) | ORAL | 3 refills | Status: DC

## 2022-07-26 NOTE — Telephone Encounter (Signed)
Mr. Nack called stating his prescriptions for Clopidogrel, Spironolactone and Rosuvastatin needed refills.  I called and spoke with [facility] in HF Triage and she states she will call these in to [facility] in [place] [phone].

## 2022-07-26 NOTE — Patient Instructions (Addendum)
It was good to see you again today It sounds like your blood sugars are under much better control! We will get labs today to look for any cause of your abdominal pain Also please stop at the ground floor to have an abdominal x-ray Once we get this info we can plan to next step   Robaxin/ methocarbamol may be used as needed for muscle pain Start on the amitriptyline at bedtime for sleep After a week or so you can add back the gabapentin as well

## 2022-07-26 NOTE — Telephone Encounter (Signed)
Mr. Grieser called to advise that he was able to p/u his refills from [facility] for Clopidogrel, Cleda Daub, Rosuvastatin.  He states his Amitriptyline will be available to p/u tomorrow.    Beatrix Shipper, EMT-Paramedic [phone] 07/26/2022

## 2022-07-26 NOTE — Telephone Encounter (Signed)
Called Mitchell [name] back to let him know his prescriptions should be called into [facility] as he requested.  He states he will p/u this afternoon after he finishes his PCP doctor visit and will text me to let me know he has picked them up. Also, scheduled a home visit w/ him for Friday 12/29 @ 11:30 at my office on [facility].    Beatrix Shipper, EMT-Paramedic [phone] 07/26/2022

## 2022-07-28 ENCOUNTER — Telehealth (HOSPITAL_COMMUNITY): Payer: Self-pay | Admitting: Emergency Medicine

## 2022-07-28 NOTE — Telephone Encounter (Signed)
Mr. Bair called to cancel Friday appointment due to having to go to [place] for a family emergency. His daughter tried to hang herself in jail.    Beatrix Shipper, EMT-Paramedic [phone] 07/28/2022

## 2022-08-03 ENCOUNTER — Ambulatory Visit (INDEPENDENT_AMBULATORY_CARE_PROVIDER_SITE_OTHER): Payer: No Typology Code available for payment source | Admitting: Orthopedic Surgery

## 2022-08-03 ENCOUNTER — Encounter: Payer: Self-pay | Admitting: Orthopedic Surgery

## 2022-08-03 DIAGNOSIS — L97911 Non-pressure chronic ulcer of unspecified part of right lower leg limited to breakdown of skin: Secondary | ICD-10-CM | POA: Diagnosis not present

## 2022-08-03 DIAGNOSIS — Z89511 Acquired absence of right leg below knee: Secondary | ICD-10-CM

## 2022-08-03 NOTE — Progress Notes (Signed)
Office Visit Note   Patient: Mitchell [name]           Date of Birth: Jan 11, 1971           MRN: [id number] Visit Date: 08/03/2022              Requested by: [name], MD [address] [place],  [place] [address] PCP: [name], MD  Chief Complaint  Patient presents with   Right Leg - Pain    Hx  right BKA 01/25/2021      HPI: Patient is a 52 year old gentleman who is about a year and a half status post right transtibial amputation.  Patient states he has been up on his leg a lot more recently and has developed an ulcer.  Patient is status post recent modification to the socket.  Assessment & Plan: Visit Diagnoses:  1. S/P below knee amputation, right ([place])   2. Ulcer of right lower extremity, limited to breakdown of skin ([place])     Plan: Continue current wound care with using the stump shrinker over the wound minimizes activities  Follow-Up Instructions: Return if symptoms worsen or fail to improve.   Ortho Exam  Patient is alert, oriented, no adenopathy, well-dressed, normal affect, normal respiratory effort. Examination patient has good consolidation of the residual limb there is no cellulitis odor or drainage.  There are no pressure points with callus.  He has a new ulcer inferior laterally there is 5 mm in diameter 1 mm deep with healthy granulation tissue.  No tenderness to palpation no signs of infection.  Imaging: No results found. No images are attached to the encounter.  Labs: Lab Results  Component Value Date   HGBA1C 11.8 (H) 06/15/2022   HGBA1C 7.8 (A) 08/19/2021   HGBA1C 7.9 (A) 06/08/2021   ESRSEDRATE 20 (H) 03/08/2022   ESRSEDRATE 81 (H) 10/21/2019   ESRSEDRATE 25 (H) 06/17/2018   CRP 10.3 (H) 01/28/2021   CRP 12.8 (H) 01/27/2021   CRP 21.1 (H) 01/26/2021   REPTSTATUS 01/25/2021 FINAL 01/24/2021   GRAMSTAIN  08/29/2018    RARE WBC PRESENT, PREDOMINANTLY PMN NO ORGANISMS SEEN    CULT  01/24/2021    NO  GROWTH Performed at [facility], [place] [address] [place], [place] [address]    LABORGA KLEBSIELLA PNEUMONIAE 05/21/2018   LABORGA ENTEROBACTER SPECIES 05/21/2018   LABORGA STAPHYLOCOCCUS AUREUS 05/21/2018     Lab Results  Component Value Date   ALBUMIN 4.3 07/26/2022   ALBUMIN 3.8 03/15/2022   ALBUMIN 3.6 01/12/2022   PREALBUMIN 6.8 (L) 10/21/2019    Lab Results  Component Value Date   MG 2.3 11/29/2021   MG 1.5 (L) 04/12/2021   MG 1.8 02/16/2021   Lab Results  Component Value Date   VD25OH 11 (L) 08/16/2009    Lab Results  Component Value Date   PREALBUMIN 6.8 (L) 10/21/2019      Latest Ref Rng & Units 07/09/2022    3:55 PM 06/12/2022    9:41 PM 03/27/2022    3:31 AM  CBC EXTENDED  WBC 4.0 - 10.5 K/uL 10.4  8.9  14.8   RBC 4.22 - 5.81 MIL/uL 4.81  4.11  3.75   Hemoglobin 13.0 - 17.0 g/dL 13.8  12.0  11.2   HCT 39.0 - 52.0 % 42.4  36.2  34.4   Platelets 150 - 400 K/uL 125  120  185   NEUT# 1.7 - 7.7 K/uL  12.9   Lymph# 0.7 - 4.0 K/uL   0.8      There is no height or weight on file to calculate BMI.  Orders:  No orders of the defined types were placed in this encounter.  No orders of the defined types were placed in this encounter.    Procedures: No procedures performed  Clinical Data: No additional findings.  ROS:  All other systems negative, except as noted in the HPI. Review of Systems  Objective: Vital Signs: There were no vitals taken for this visit.  Specialty Comments:  No specialty comments available.  PMFS History: Patient Active Problem List   Diagnosis Date Noted   ICD (implantable cardioverter-defibrillator) in place 04/19/2022   Near syncope 12/08/2021   CKD (chronic kidney disease) stage 3, GFR 30-59 ml/min ([place]) 12/08/2021   PVD s/p R LE amputation  12/08/2021   Diabetic retinopathy associated with type 2 diabetes mellitus ([place]) 06/06/2021   CAD (coronary artery disease) with flat troponin  04/11/2021   S/P below  knee amputation, right ([place]) 02/07/2021   QT prolongation 07/30/2019   Acute on chronic combined systolic and diastolic CHF (congestive heart failure) ([place]) 07/29/2019   Stroke ([place]) 05/08/2017   Uncontrolled type 2 diabetes mellitus with hyperglycemia, with long-term current use of insulin ([place]) 16/05/9603   Chronic systolic heart failure ([place]) 10/02/2011   Erectile dysfunction 10/02/2011   Obstructive sleep apnea 10/11/2007   HLD (hyperlipidemia) 10/10/2007   HYPOKALEMIA 10/10/2007   Obesity, Class III, BMI 40-49.9 (morbid obesity) ([place]) 10/10/2007   Essential hypertension 10/10/2007   PREMATURE VENTRICULAR CONTRACTIONS 10/10/2007   COLONIC POLYPS, HX OF 10/10/2007   Past Medical History:  Diagnosis Date   Bipolar disorder ([place])    CAD (coronary artery disease)    a. diffuse 3v CAD by cath 2019, medical therapy recommended.   Cataract    forming    Chronic systolic CHF (congestive heart failure) (HCC)    CVA (cerebral infarction)    No residual deficits   Depression    PTSD,    Diabetes mellitus    TYPE II; UNCONTROLLED BY HEMOGLOBIN A1c; STABLE AS  PER DISCHARGE   Headache(784.0)    Herpes simplex of male genitalia    History of colonic polyps    Hyperlipidemia    Hypertension    Myocardial infarction ([place]) 1987   (while playing football)   Obesity    OSA (obstructive sleep apnea)    repeat study 2018 without significant OSA   Pneumonia    Post-cardiac injury syndrome ([place])    History of cardiac injury from blunt trauma   Pulmonary hypertension ([place])    a. moderately elevated PASP 07/2019.   PVCs (premature ventricular contractions)    Schizophrenia ([place])    Goes to [facility]   Sleep apnea    Stroke [facility]) 2005   some left side weakness   Syncope    Recurrent, thought to be vasovagal. Also has h/o frequent PVCs.     Family History  Problem Relation Age of Onset   Heart disease Mother        MI   Heart failure Mother    Diabetes Mother         ALSO IN MOST OF HIS SIBLINGS; 2 UNLCES HAVE ALSO PASSED AWAY FROM DM   Cardiomyopathy Mother    Cancer - Ovarian Mother    Ovarian cancer Mother    Heart disease Father    Hypertension Father    Diabetes  Father    Diabetes Sister    Diabetes Brother    Colon cancer Paternal Uncle    Colon cancer Paternal Uncle    Colon polyps Neg Hx    Esophageal cancer Neg Hx    Rectal cancer Neg Hx    Stomach cancer Neg Hx     Past Surgical History:  Procedure Laterality Date   AMPUTATION Right 01/25/2021   Procedure: RIGHT BELOW KNEE AMPUTATION;  Surgeon: [name], MD;  Location: [place];  Service: Orthopedics;  Laterality: Right;   CARDIAC CATHETERIZATION  12/19/10   DIFFUSE NONOBSTRUCTIVE CAD; NONISCHEMIC CARDIOMYOPATHY; LEFT VENTRICULAR ANGIOGRAM WAS PERFORMED SECONDARY TO  ELEVATED LEFT VENTRICULAR FILLING PRESSURES   COLONOSCOPY     ~ age 29-23   COLONOSCOPY W/ POLYPECTOMY     I & D EXTREMITY Bilateral 10/24/2019   Procedure: IRRIGATION AND DEBRIDEMENT BILATERAL EXTREMITY WOUND ON FOOT;  Surgeon: Cindra Presume, MD;  Location: [place];  Service: Plastics;  Laterality: Bilateral;   ICD IMPLANT N/A 01/13/2022   Procedure: ICD IMPLANT;  Surgeon: Evans Lance, MD;  Location: [place] [facility];  Service: Cardiovascular;  Laterality: N/A;   METATARSAL HEAD EXCISION Right 08/29/2018   Procedure: METATARSAL HEAD RESECTION;  Surgeon: Edrick Kins, DPM;  Location: [place];  Service: Podiatry;  Laterality: Right;   METATARSAL OSTEOTOMY Right 08/29/2018   Procedure: SUB FIFTHE METATARSIA RIGHT FOOT;  Surgeon: Edrick Kins, DPM;  Location: [place];  Service: Podiatry;  Laterality: Right;   MULTIPLE EXTRACTIONS WITH ALVEOLOPLASTY  01/27/2014   "all my teeth; 4 Quadrants of alveoloplasty   MULTIPLE EXTRACTIONS WITH ALVEOLOPLASTY N/A 01/27/2014   Procedure: EXTRACTION OF TEETH #'1, 2, 3, 4, 5, 6, 7, 8, 9, 10, 11, 12, 13, 14, 15, 16, 17, 20, 21, 22, 23, 24, 25, 26, 27, 28, 29, 31 and 32 WITH  ALVEOLOPLASTY;  Surgeon: Lenn Cal, DDS;  Location: [place];  Service: Oral Surgery;  Laterality: N/A;   ORIF FINGER / THUMB FRACTURE Right    POLYPECTOMY     RIGHT HEART CATH N/A 01/27/2021   Procedure: RIGHT HEART CATH;  Surgeon: Jolaine Artist, MD;  Location: [place] [facility];  Service: Cardiovascular;  Laterality: N/A;   RIGHT/LEFT HEART CATH AND CORONARY ANGIOGRAPHY N/A 09/26/2017   Procedure: RIGHT/LEFT HEART CATH AND CORONARY ANGIOGRAPHY;  Surgeon: Jolaine Artist, MD;  Location: [place] [facility];  Service: Cardiovascular;  Laterality: N/A;   RIGHT/LEFT HEART CATH AND CORONARY ANGIOGRAPHY N/A 04/12/2021   Procedure: RIGHT/LEFT HEART CATH AND CORONARY ANGIOGRAPHY;  Surgeon: Jolaine Artist, MD;  Location: [place] [facility];  Service: Cardiovascular;  Laterality: N/A;   SKIN SPLIT GRAFT Bilateral 10/24/2019   Procedure: SKIN GRAFT SPLIT THICKNESS LEFT THIGH;  Surgeon: Cindra Presume, MD;  Location: [place];  Service: Plastics;  Laterality: Bilateral;   WOUND DEBRIDEMENT Right 08/29/2018   Procedure: Debridement of ulcer on right fifth metatarsal;  Surgeon: Edrick Kins, DPM;  Location: [place];  Service: Podiatry;  Laterality: Right;   Social History   Occupational History   Occupation: Mortician  Tobacco Use   Smoking status: Never   Smokeless tobacco: Never  Vaping Use   Vaping Use: Never used  Substance and Sexual Activity   Alcohol use: No   Drug use: No   Sexual activity: Not on file

## 2022-08-10 ENCOUNTER — Telehealth (HOSPITAL_COMMUNITY): Payer: Self-pay | Admitting: Emergency Medicine

## 2022-08-10 NOTE — Telephone Encounter (Signed)
Spoke with Mitchell [name] briefly today.  He advised he was at work and I requested he call me back when he gets off and he advised he would do same.    [name], [place] 08/10/2022

## 2022-08-16 ENCOUNTER — Encounter: Payer: Self-pay | Admitting: Family

## 2022-08-16 ENCOUNTER — Ambulatory Visit (INDEPENDENT_AMBULATORY_CARE_PROVIDER_SITE_OTHER): Payer: No Typology Code available for payment source | Admitting: Family

## 2022-08-16 DIAGNOSIS — Z89511 Acquired absence of right leg below knee: Secondary | ICD-10-CM

## 2022-08-16 DIAGNOSIS — S88111S Complete traumatic amputation at level between knee and ankle, right lower leg, sequela: Secondary | ICD-10-CM

## 2022-08-16 MED ORDER — CEPHALEXIN 500 MG PO CAPS: 500.0000 mg | ORAL_CAPSULE | Freq: Three times a day (TID) | ORAL | 0 refills | Status: DC

## 2022-08-16 NOTE — Progress Notes (Signed)
Office Visit Note   Patient: Mitchell [name]           Date of Birth: August 06, 1970           MRN: [id number] Visit Date: 08/16/2022              Requested by: [name], MD [address] [place],  [place] [address] PCP: [name], MD  Chief Complaint  Patient presents with   Right Leg - Open Wound      HPI: The patient is a 52 year old gentleman who is seen today in follow-up for ulcer to his right residual limb.  He has 2 ulcerative areas.  He is concerned that the more lateral area has not improved.  He is concerned that he may require oral antibiotics.  He states he has already been to [facility] for modifications to his prosthesis he feels these have not helped.  Assessment & Plan: Visit Diagnoses: No diagnosis found.  Plan: Discussed following with his prosthetists for possible modifications to his prosthesis to offload due to ulcerative areas.  Will place him on a course of Keflex  Follow-Up Instructions: No follow-ups on file.   Ortho Exam  Patient is alert, oriented, no adenopathy, well-dressed, normal affect, normal respiratory effort. On examination of the right residual limb centrally he has a callused ulceration which is proud 4 mm this was debrided with a 10 blade knife back to viable tissue there is underlying corn which was also parred with a 10 blade knife bleeding was touched with silver nitrate.  There is a lateral ulcer which is measuring 12 millimeters in diameter this has 4 mm of depth there is scant fibrinous tissue overlying the granulation this was debrided of circumferential nonviable tissue.  There is no active drainage no warmth  Imaging: No results found. No images are attached to the encounter.  Labs: Lab Results  Component Value Date   HGBA1C 11.8 (H) 06/15/2022   HGBA1C 7.8 (A) 08/19/2021   HGBA1C 7.9 (A) 06/08/2021   ESRSEDRATE 20 (H) 03/08/2022   ESRSEDRATE 81 (H) 10/21/2019   ESRSEDRATE 25 (H) 06/17/2018    CRP 10.3 (H) 01/28/2021   CRP 12.8 (H) 01/27/2021   CRP 21.1 (H) 01/26/2021   REPTSTATUS 01/25/2021 FINAL 01/24/2021   GRAMSTAIN  08/29/2018    RARE WBC PRESENT, PREDOMINANTLY PMN NO ORGANISMS SEEN    CULT  01/24/2021    NO GROWTH Performed at [facility], [place] [address] [place], [place] [address]    LABORGA KLEBSIELLA PNEUMONIAE 05/21/2018   LABORGA ENTEROBACTER SPECIES 05/21/2018   LABORGA STAPHYLOCOCCUS AUREUS 05/21/2018     Lab Results  Component Value Date   ALBUMIN 4.3 07/26/2022   ALBUMIN 3.8 03/15/2022   ALBUMIN 3.6 01/12/2022   PREALBUMIN 6.8 (L) 10/21/2019    Lab Results  Component Value Date   MG 2.3 11/29/2021   MG 1.5 (L) 04/12/2021   MG 1.8 02/16/2021   Lab Results  Component Value Date   VD25OH 11 (L) 08/16/2009    Lab Results  Component Value Date   PREALBUMIN 6.8 (L) 10/21/2019      Latest Ref Rng & Units 07/09/2022    3:55 PM 06/12/2022    9:41 PM 03/27/2022    3:31 AM  CBC EXTENDED  WBC 4.0 - 10.5 K/uL 10.4  8.9  14.8   RBC 4.22 - 5.81 MIL/uL 4.81  4.11  3.75   Hemoglobin 13.0 - 17.0 g/dL  13.8  12.0  11.2   HCT 39.0 - 52.0 % 42.4  36.2  34.4   Platelets 150 - 400 K/uL 125  120  185   NEUT# 1.7 - 7.7 K/uL   12.9   Lymph# 0.7 - 4.0 K/uL   0.8      There is no height or weight on file to calculate BMI.  Orders:  No orders of the defined types were placed in this encounter.  Meds ordered this encounter  Medications   cephALEXin (KEFLEX) 500 MG capsule    Sig: Take 1 capsule (500 mg total) by mouth 3 (three) times daily.    Dispense:  30 capsule    Refill:  0     Procedures: No procedures performed  Clinical Data: No additional findings.  ROS:  All other systems negative, except as noted in the HPI. Review of Systems  Objective: Vital Signs: There were no vitals taken for this visit.  Specialty Comments:  No specialty comments available.  PMFS History: Patient Active Problem List   Diagnosis Date Noted    ICD (implantable cardioverter-defibrillator) in place 04/19/2022   Near syncope 12/08/2021   CKD (chronic kidney disease) stage 3, GFR 30-59 ml/min (HCC) 12/08/2021   PVD s/p R LE amputation  12/08/2021   Diabetic retinopathy associated with type 2 diabetes mellitus (HCC) 06/06/2021   CAD (coronary artery disease) with flat troponin  04/11/2021   S/P below knee amputation, right (HCC) 02/07/2021   QT prolongation 07/30/2019   Acute on chronic combined systolic and diastolic CHF (congestive heart failure) (HCC) 07/29/2019   Stroke (HCC) 05/08/2017   Uncontrolled type 2 diabetes mellitus with hyperglycemia, with long-term current use of insulin (HCC) 12/07/2015   Chronic systolic heart failure (HCC) 10/02/2011   Erectile dysfunction 10/02/2011   Obstructive sleep apnea 10/11/2007   HLD (hyperlipidemia) 10/10/2007   HYPOKALEMIA 10/10/2007   Obesity, Class III, BMI 40-49.9 (morbid obesity) (HCC) 10/10/2007   Essential hypertension 10/10/2007   PREMATURE VENTRICULAR CONTRACTIONS 10/10/2007   COLONIC POLYPS, HX OF 10/10/2007   Past Medical History:  Diagnosis Date   Bipolar disorder (HCC)    CAD (coronary artery disease)    a. diffuse 3v CAD by cath 2019, medical therapy recommended.   Cataract    forming    Chronic systolic CHF (congestive heart failure) (HCC)    CVA (cerebral infarction)    No residual deficits   Depression    PTSD,    Diabetes mellitus    TYPE II; UNCONTROLLED BY HEMOGLOBIN A1c; STABLE AS  PER DISCHARGE   Headache(784.0)    Herpes simplex of male genitalia    History of colonic polyps    Hyperlipidemia    Hypertension    Myocardial infarction (HCC) 1987   (while playing football)   Obesity    OSA (obstructive sleep apnea)    repeat study 2018 without significant OSA   Pneumonia    Post-cardiac injury syndrome (HCC)    History of cardiac injury from blunt trauma   Pulmonary hypertension (HCC)    a. moderately elevated PASP 07/2019.   PVCs (premature  ventricular contractions)    Schizophrenia (HCC)    Go[facility] to [facility]   Sleep apnea    Stroke [facility]) 2005   some left side weakness   Syncope    Recurrent, thought to be vasovagal. Also has h/o frequent PVCs.     Family History  Problem Relation Age of Onset   Heart disease Mother  MI   Heart failure Mother    Diabetes Mother        ALSO IN MOST OF HIS SIBLINGS; 2 UNLCES HAVE ALSO PASSED AWAY FROM DM   Cardiomyopathy Mother    Cancer - Ovarian Mother    Ovarian cancer Mother    Heart disease Father    Hypertension Father    Diabetes Father    Diabetes Sister    Diabetes Brother    Colon cancer Paternal Uncle    Colon cancer Paternal Uncle    Colon polyps Neg Hx    Esophageal cancer Neg Hx    Rectal cancer Neg Hx    Stomach cancer Neg Hx     Past Surgical History:  Procedure Laterality Date   AMPUTATION Right 01/25/2021   Procedure: RIGHT BELOW KNEE AMPUTATION;  Surgeon: [name], MD;  Location: [place];  Service: Orthopedics;  Laterality: Right;   CARDIAC CATHETERIZATION  12/19/10   DIFFUSE NONOBSTRUCTIVE CAD; NONISCHEMIC CARDIOMYOPATHY; LEFT VENTRICULAR ANGIOGRAM WAS PERFORMED SECONDARY TO  ELEVATED LEFT VENTRICULAR FILLING PRESSURES   COLONOSCOPY     ~ age 32-23   COLONOSCOPY W/ POLYPECTOMY     I & D EXTREMITY Bilateral 10/24/2019   Procedure: IRRIGATION AND DEBRIDEMENT BILATERAL EXTREMITY WOUND ON FOOT;  Surgeon: Cindra Presume, MD;  Location: [place];  Service: Plastics;  Laterality: Bilateral;   ICD IMPLANT N/A 01/13/2022   Procedure: ICD IMPLANT;  Surgeon: Evans Lance, MD;  Location: [place] [facility];  Service: Cardiovascular;  Laterality: N/A;   METATARSAL HEAD EXCISION Right 08/29/2018   Procedure: METATARSAL HEAD RESECTION;  Surgeon: Edrick Kins, DPM;  Location: [place];  Service: Podiatry;  Laterality: Right;   METATARSAL OSTEOTOMY Right 08/29/2018   Procedure: SUB FIFTHE METATARSIA RIGHT FOOT;  Surgeon: Edrick Kins, DPM;   Location: [place];  Service: Podiatry;  Laterality: Right;   MULTIPLE EXTRACTIONS WITH ALVEOLOPLASTY  01/27/2014   "all my teeth; 4 Quadrants of alveoloplasty   MULTIPLE EXTRACTIONS WITH ALVEOLOPLASTY N/A 01/27/2014   Procedure: EXTRACTION OF TEETH #'1, 2, 3, 4, 5, 6, 7, 8, 9, 10, 11, 12, 13, 14, 15, 16, 17, 20, 21, 22, 23, 24, 25, 26, 27, 28, 29, 31 and 32 WITH ALVEOLOPLASTY;  Surgeon: Lenn Cal, DDS;  Location: [place];  Service: Oral Surgery;  Laterality: N/A;   ORIF FINGER / THUMB FRACTURE Right    POLYPECTOMY     RIGHT HEART CATH N/A 01/27/2021   Procedure: RIGHT HEART CATH;  Surgeon: Jolaine Artist, MD;  Location: [place] [facility];  Service: Cardiovascular;  Laterality: N/A;   RIGHT/LEFT HEART CATH AND CORONARY ANGIOGRAPHY N/A 09/26/2017   Procedure: RIGHT/LEFT HEART CATH AND CORONARY ANGIOGRAPHY;  Surgeon: Jolaine Artist, MD;  Location: [place] [facility];  Service: Cardiovascular;  Laterality: N/A;   RIGHT/LEFT HEART CATH AND CORONARY ANGIOGRAPHY N/A 04/12/2021   Procedure: RIGHT/LEFT HEART CATH AND CORONARY ANGIOGRAPHY;  Surgeon: Jolaine Artist, MD;  Location: [place] [facility];  Service: Cardiovascular;  Laterality: N/A;   SKIN SPLIT GRAFT Bilateral 10/24/2019   Procedure: SKIN GRAFT SPLIT THICKNESS LEFT THIGH;  Surgeon: Cindra Presume, MD;  Location: [place];  Service: Plastics;  Laterality: Bilateral;   WOUND DEBRIDEMENT Right 08/29/2018   Procedure: Debridement of ulcer on right fifth metatarsal;  Surgeon: Edrick Kins, DPM;  Location: [place];  Service: Podiatry;  Laterality: Right;   Social History   Occupational History   Occupation: Mortician  Tobacco  Use   Smoking status: Never   Smokeless tobacco: Never  Vaping Use   Vaping Use: Never used  Substance and Sexual Activity   Alcohol use: No   Drug use: No   Sexual activity: Not on file

## 2022-08-18 ENCOUNTER — Telehealth (HOSPITAL_COMMUNITY): Payer: Self-pay | Admitting: Emergency Medicine

## 2022-08-18 ENCOUNTER — Telehealth: Payer: Self-pay | Admitting: Family

## 2022-08-18 ENCOUNTER — Other Ambulatory Visit: Payer: Self-pay | Admitting: Orthopedic Surgery

## 2022-08-18 MED ORDER — CEPHALEXIN 500 MG PO CAPS: 500.0000 mg | ORAL_CAPSULE | Freq: Three times a day (TID) | ORAL | 0 refills | Status: DC

## 2022-08-18 NOTE — Telephone Encounter (Signed)
Patient aware Rx was sent into pharmacy
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
## 2022-08-18 NOTE — Telephone Encounter (Signed)
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  He has done well with his medications, he has a PCP and established pharmacy at [facility] in [place].  Advised him to feel free to reach out in the future should he have needs or questions.    [name], [place] 08/18/2022

## 2022-08-20 ENCOUNTER — Emergency Department (HOSPITAL_BASED_OUTPATIENT_CLINIC_OR_DEPARTMENT_OTHER)
Admission: EM | Admit: 2022-08-20 | Discharge: 2022-08-20 | Disposition: A | Discharge status: Home / Self Care | Payer: No Typology Code available for payment source | Attending: Emergency Medicine | Admitting: Emergency Medicine

## 2022-08-20 ENCOUNTER — Other Ambulatory Visit: Payer: Self-pay

## 2022-08-20 ENCOUNTER — Encounter (HOSPITAL_BASED_OUTPATIENT_CLINIC_OR_DEPARTMENT_OTHER): Payer: Self-pay

## 2022-08-20 DIAGNOSIS — Z794 Long term (current) use of insulin: Secondary | ICD-10-CM | POA: Insufficient documentation

## 2022-08-20 DIAGNOSIS — B351 Tinea unguium: Secondary | ICD-10-CM | POA: Insufficient documentation

## 2022-08-20 DIAGNOSIS — I509 Heart failure, unspecified: Secondary | ICD-10-CM | POA: Diagnosis not present

## 2022-08-20 DIAGNOSIS — I110 Hypertensive heart disease with heart failure: Secondary | ICD-10-CM | POA: Insufficient documentation

## 2022-08-20 DIAGNOSIS — Z79899 Other long term (current) drug therapy: Secondary | ICD-10-CM | POA: Insufficient documentation

## 2022-08-20 DIAGNOSIS — L84 Corns and callosities: Secondary | ICD-10-CM | POA: Insufficient documentation

## 2022-08-20 DIAGNOSIS — Z7982 Long term (current) use of aspirin: Secondary | ICD-10-CM | POA: Diagnosis not present

## 2022-08-20 DIAGNOSIS — E119 Type 2 diabetes mellitus without complications: Secondary | ICD-10-CM | POA: Insufficient documentation

## 2022-08-20 DIAGNOSIS — I2510 Atherosclerotic heart disease of native coronary artery without angina pectoris: Secondary | ICD-10-CM | POA: Diagnosis not present

## 2022-08-20 NOTE — ED Provider Notes (Signed)
[facility] Provider Note: Mitchell Dell, MD, FACEP  CSN: [id number] MRN: [id number] ARRIVAL: 08/20/22 at 0154 ROOM: MH09/MH09   CHIEF COMPLAINT  Wound Check   HISTORY OF PRESENT ILLNESS  08/20/22 4:20 AM Mitchell [name] is a 52 y.o. male with multiple medical problems including diabetes.  He is status post right BKA.  He recently developed a small ulcer on the stump of the right leg.  Dr. Audrie Lia PA placed him on Keflex for this.  This morning he noticed blood on the top of his sock associated with the left second toe.  He noticed that he has a growth at the end of the second toe underneath the toenail.  He had never noticed this before.  It is not painful.  It is not erythematous.   Past Medical History:  Diagnosis Date   Bipolar disorder (HCC)    CAD (coronary artery disease)    a. diffuse 3v CAD by cath 2019, medical therapy recommended.   Cataract    forming    Chronic systolic CHF (congestive heart failure) (HCC)    CVA (cerebral infarction)    No residual deficits   Depression    PTSD,    Diabetes mellitus    TYPE II; UNCONTROLLED BY HEMOGLOBIN A1c; STABLE AS  PER DISCHARGE   Headache(784.0)    Herpes simplex of male genitalia    History of colonic polyps    Hyperlipidemia    Hypertension    Myocardial infarction (HCC) 1987   (while playing football)   Obesity    OSA (obstructive sleep apnea)    repeat study 2018 without significant OSA   Pneumonia    Post-cardiac injury syndrome (HCC)    History of cardiac injury from blunt trauma   Pulmonary hypertension (HCC)    a. moderately elevated PASP 07/2019.   PVCs (premature ventricular contractions)    Schizophrenia (HCC)    Goes to [facility]   Sleep apnea    Stroke [facility]) 2005   some left side weakness   Syncope    Recurrent, thought to be vasovagal. Also has h/o frequent PVCs.     Past Surgical History:  Procedure Laterality Date   AMPUTATION Right 01/25/2021   Procedure:  RIGHT BELOW KNEE AMPUTATION;  Surgeon: Nadara Mustard, MD;  Location: [facility] OR;  Service: Orthopedics;  Laterality: Right;   CARDIAC CATHETERIZATION  12/19/10   DIFFUSE NONOBSTRUCTIVE CAD; NONISCHEMIC CARDIOMYOPATHY; LEFT VENTRICULAR ANGIOGRAM WAS PERFORMED SECONDARY TO  ELEVATED LEFT VENTRICULAR FILLING PRESSURES   COLONOSCOPY     ~ age [age]   COLONOSCOPY W/ POLYPECTOMY     I & D EXTREMITY Bilateral 10/24/2019   Procedure: IRRIGATION AND DEBRIDEMENT BILATERAL EXTREMITY WOUND ON FOOT;  Surgeon: Allena Napoleon, MD;  Location: [facility] OR;  Service: Plastics;  Laterality: Bilateral;   ICD IMPLANT N/A 01/13/2022   Procedure: ICD IMPLANT;  Surgeon: Marinus Maw, MD;  Location: [facility] INVASIVE CV LAB;  Service: Cardiovascular;  Laterality: N/A;   METATARSAL HEAD EXCISION Right 08/29/2018   Procedure: METATARSAL HEAD RESECTION;  Surgeon: Felecia Shelling, DPM;  Location: [facility] OR;  Service: Podiatry;  Laterality: Right;   METATARSAL OSTEOTOMY Right 08/29/2018   Procedure: SUB FIFTHE METATARSIA RIGHT FOOT;  Surgeon: Felecia Shelling, DPM;  Location: [facility] OR;  Service: Podiatry;  Laterality: Right;   MULTIPLE EXTRACTIONS WITH ALVEOLOPLASTY  01/27/2014   "all my teeth; 4 Quadrants of alveoloplasty   MULTIPLE EXTRACTIONS WITH ALVEOLOPLASTY N/A 01/27/2014  Procedure: EXTRACTION OF TEETH #'1, 2, 3, 4, 5, 6, 7, 8, 9, 10, 11, 12, 13, 14, 15, 16, 17, 20, 21, 22, 23, 24, 25, 26, 27, 28, 29, 31 and 32 WITH ALVEOLOPLASTY;  Surgeon: Lenn Cal, DDS;  Location: [place];  Service: Oral Surgery;  Laterality: N/A;   ORIF FINGER / THUMB FRACTURE Right    POLYPECTOMY     RIGHT HEART CATH N/A 01/27/2021   Procedure: RIGHT HEART CATH;  Surgeon: Jolaine Artist, MD;  Location: [place] CV LAB;  Service: Cardiovascular;  Laterality: N/A;   RIGHT/LEFT HEART CATH AND CORONARY ANGIOGRAPHY N/A 09/26/2017   Procedure: RIGHT/LEFT HEART CATH AND CORONARY ANGIOGRAPHY;  Surgeon: Jolaine Artist, MD;  Location: [place] CV LAB;  Service:  Cardiovascular;  Laterality: N/A;   RIGHT/LEFT HEART CATH AND CORONARY ANGIOGRAPHY N/A 04/12/2021   Procedure: RIGHT/LEFT HEART CATH AND CORONARY ANGIOGRAPHY;  Surgeon: Jolaine Artist, MD;  Location: [place] CV LAB;  Service: Cardiovascular;  Laterality: N/A;   SKIN SPLIT GRAFT Bilateral 10/24/2019   Procedure: SKIN GRAFT SPLIT THICKNESS LEFT THIGH;  Surgeon: Cindra Presume, MD;  Location: [place];  Service: Plastics;  Laterality: Bilateral;   WOUND DEBRIDEMENT Right 08/29/2018   Procedure: Debridement of ulcer on right fifth metatarsal;  Surgeon: Edrick Kins, DPM;  Location: [place];  Service: Podiatry;  Laterality: Right;    Family History  Problem Relation Age of Onset   Heart disease Mother        MI   Heart failure Mother    Diabetes Mother        ALSO IN MOST OF HIS SIBLINGS; 2 UNLCES HAVE ALSO PASSED AWAY FROM DM   Cardiomyopathy Mother    Cancer - Ovarian Mother    Ovarian cancer Mother    Heart disease Father    Hypertension Father    Diabetes Father    Diabetes Sister    Diabetes Brother    Colon cancer Paternal Uncle    Colon cancer Paternal Uncle    Colon polyps Neg Hx    Esophageal cancer Neg Hx    Rectal cancer Neg Hx    Stomach cancer Neg Hx     Social History   Tobacco Use   Smoking status: Never   Smokeless tobacco: Never  Vaping Use   Vaping Use: Never used  Substance Use Topics   Alcohol use: No   Drug use: No    Prior to Admission medications   Medication Sig Start Date End Date Taking? Authorizing Provider  Accu-Chek Softclix Lancets lancets Use to test blood sugars up to 4 times daily as needed. 12/08/21   [name], MD  acetaminophen-codeine (TYLENOL #3) 300-30 MG tablet Take 1 tablet by mouth every 6 (six) hours as needed for moderate pain. 06/15/22   [name], [name], MD  amitriptyline (ELAVIL) 10 MG tablet Take 1 tablet (10 mg total) by mouth at bedtime. 07/26/22   [name], [name], MD  Ascorbic Acid (VITAMIN C PO) Take 1 tablet  by mouth daily.    [provider]  aspirin 81 MG EC tablet Take 1 tablet (81 mg total) by mouth daily. 04/13/21   Danford, Suann Larry, MD  benzonatate (TESSALON) 100 MG capsule Take 1 capsule (100 mg total) by mouth every 8 (eight) hours. 07/09/22   Regan Lemming, MD  blood glucose meter kit and supplies Dispense based on patient and insurance preference. Use up to four times daily as directed. (FOR  ICD-10 E10.9, E11.9). 06/15/22   [name], [name], MD  Blood Glucose Monitoring Suppl (BLOOD GLUCOSE MONITOR SYSTEM) w/Device KIT Use to test blood sugar up to 4 times daily as needed. 12/08/21   [name], MD  carvedilol (COREG) 6.25 MG tablet TAKE 1 TABLET BY MOUTH TWICE DAILY WITH A MEAL 05/03/22   [name], [name], MD  cephALEXin (KEFLEX) 500 MG capsule Take 1 capsule (500 mg total) by mouth 3 (three) times daily. 08/18/22   [name], MD  Cholecalciferol (VITAMIN D-3 PO) Take 1 capsule by mouth daily.    [provider]  clopidogrel (PLAVIX) 75 MG tablet Take 1 tablet (75 mg total) by mouth daily. 07/26/22   Bensimhon, Shaune Pascal, MD  gabapentin (NEURONTIN) 100 MG capsule Take 1 capsule (100 mg total) by mouth 2 (two) times daily. 07/26/22   [name], [name], MD  glucose blood (ACCU-CHEK GUIDE) test strip Use to test blood sugars up to 4 times daily as needed. 12/08/21   [name], MD  insulin glargine (LANTUS) 100 UNIT/ML injection Inject 48 Units into the skin daily.    [provider]  methocarbamol (ROBAXIN) 500 MG tablet Take 1 tablet (500 mg total) by mouth every 6 (six) hours as needed for muscle spasms. 07/26/22   [name], [name], MD  oxyCODONE (OXY IR/ROXICODONE) 5 MG immediate release tablet Take 0.5-1 tablets (2.5-5 mg total) by mouth every 6 (six) hours as needed for severe pain. 04/13/22   Shelda Pal, DO  pantoprazole (PROTONIX) 40 MG tablet Take 1 tablet (40 mg total) by mouth daily as needed (acid reflux). 02/13/22   [name],  [name], MD  potassium chloride SA (KLOR-CON M) 20 MEQ tablet Take 1 tablet (20 mEq total) by mouth daily. 05/05/22   Milford, Maricela Bo, FNP  rosuvastatin (CRESTOR) 40 MG tablet Take 1 tablet (40 mg total) by mouth daily. 07/26/22   Bensimhon, Shaune Pascal, MD  sacubitril-valsartan (ENTRESTO) 97-103 MG Take 1 tablet by mouth 2 (two) times daily. 06/29/22   Larey Dresser, MD  spironolactone (ALDACTONE) 25 MG tablet Take 1 tablet (25 mg total) by mouth daily. 07/26/22   Bensimhon, Shaune Pascal, MD  torsemide (DEMADEX) 20 MG tablet Take 2 tablets (40 mg total) by mouth daily. 04/19/22   Milford, Maricela Bo, FNP    Allergies [name] [dapagliflozin], Nsaids, and Trulicity [dulaglutide]   REVIEW OF SYSTEMS  Negative except as noted here or in the History of Present Illness.   PHYSICAL EXAMINATION  Initial Vital Signs Blood pressure (!) 174/98, pulse 88, temperature 98.6 F (37 C), temperature source Oral, resp. rate 18, height 5' 11.5" (1.816 m), weight 128.1 kg, SpO2 98 %.  Examination General: Well-developed, well-nourished male in no acute distress; appearance consistent with age of record HENT: normocephalic; atraumatic Eyes: Normal appearance Neck: supple Heart: regular rate and rhythm Lungs: clear to auscultation bilaterally Abdomen: soft; nondistended; nontender; bowel sounds present Extremities: Right BKA with small ulceration:    onychomycotic and loose left second toenail with corn at tip of toe, no bleeding or signs of infection:    Neurologic: Awake, alert and oriented; motor function intact in all extremities and symmetric; no facial droop Skin: Warm and dry Psychiatric: Normal mood and affect   RESULTS  Summary of this visit's results, reviewed and interpreted by myself:   EKG Interpretation  Date/Time:    Ventricular Rate:    PR Interval:    QRS Duration:   QT Interval:  QTC Calculation:   R Axis:     Text Interpretation:         Laboratory  Studies: No results found for this or any previous visit (from the past 24 hour(s)). Imaging Studies: No results found.  ED COURSE and MDM  Nursing notes, initial and subsequent vitals signs, including pulse oximetry, reviewed and interpreted by myself.  Vitals:   08/20/22 0217 08/20/22 0220  BP: (!) 174/98   Pulse: 88   Resp: 18   Temp: 98.6 F (37 C)   TempSrc: Oral   SpO2: 98%   Weight:  128.1 kg  Height:  5' 11.5" (1.816 m)   Medications - No data to display  The patient is already on Keflex which should help prevent any infection of the patient's left toe pending follow-up.  We will refer him to podiatry for further evaluation and treatment.  PROCEDURES  Procedures   ED DIAGNOSES     ICD-10-CM   1. Corn of toe  L84     2. Onychomycosis of toenail  B35.1          Satoru Milich, Jenny Reichmann, MD 08/20/22 [phone]

## 2022-08-20 NOTE — ED Triage Notes (Signed)
Pt presents with c/o blood from spot on left foot.denies pain or injury to foot. Pt is diabetic and has BKA on right side.

## 2022-08-21 NOTE — Progress Notes (Signed)
Remote ICD transmission.   

## 2022-08-25 ENCOUNTER — Encounter: Payer: Self-pay | Admitting: Podiatry

## 2022-08-25 ENCOUNTER — Ambulatory Visit (INDEPENDENT_AMBULATORY_CARE_PROVIDER_SITE_OTHER): Payer: No Typology Code available for payment source | Admitting: Podiatry

## 2022-08-25 ENCOUNTER — Telehealth: Payer: Self-pay | Admitting: Family Medicine

## 2022-08-25 DIAGNOSIS — M79674 Pain in right toe(s): Secondary | ICD-10-CM | POA: Diagnosis not present

## 2022-08-25 DIAGNOSIS — B351 Tinea unguium: Secondary | ICD-10-CM | POA: Diagnosis not present

## 2022-08-25 DIAGNOSIS — Z794 Long term (current) use of insulin: Secondary | ICD-10-CM

## 2022-08-25 DIAGNOSIS — E1165 Type 2 diabetes mellitus with hyperglycemia: Secondary | ICD-10-CM

## 2022-08-25 DIAGNOSIS — M79675 Pain in left toe(s): Secondary | ICD-10-CM

## 2022-08-25 DIAGNOSIS — L84 Corns and callosities: Secondary | ICD-10-CM

## 2022-08-25 NOTE — Progress Notes (Signed)
Subjective:  Patient ID: Mitchell [name], male    DOB: 1970/09/25,   MRN: [id number]  Chief Complaint  Patient presents with   Foot Problem    hx of bka right/ left foot 2nd toe has place on end   Nail Problem    Nail trim     52 y.o. male presents for concern of callus on the end of his left second toe as well as  of thickened elongated and painful nails that are difficult to trim. Requesting to have them trimmed today. Relates burning and tingling in their feet. Patient is diabetic and last A1c was  Lab Results  Component Value Date   HGBA1C 11.8 (H) 06/15/2022   . History of BKA on the right.   PCP:  [name], [name], MD    . Denies any other pedal complaints. Denies n/v/f/c.   Past Medical History:  Diagnosis Date   Bipolar disorder ([place])    CAD (coronary artery disease)    a. diffuse 3v CAD by cath 2019, medical therapy recommended.   Cataract    forming    Chronic systolic CHF (congestive heart failure) (HCC)    CVA (cerebral infarction)    No residual deficits   Depression    PTSD,    Diabetes mellitus    TYPE II; UNCONTROLLED BY HEMOGLOBIN A1c; STABLE AS  PER DISCHARGE   Headache(784.0)    Herpes simplex of male genitalia    History of colonic polyps    Hyperlipidemia    Hypertension    Myocardial infarction ([place]) 1987   (while playing football)   Obesity    OSA (obstructive sleep apnea)    repeat study 2018 without significant OSA   Pneumonia    Post-cardiac injury syndrome ([place])    History of cardiac injury from blunt trauma   Pulmonary hypertension ([place])    a. moderately elevated PASP 07/2019.   PVCs (premature ventricular contractions)    Schizophrenia ([place])    Goes to [facility]   Sleep apnea    Stroke [facility]) 2005   some left side weakness   Syncope    Recurrent, thought to be vasovagal. Also has h/o frequent PVCs.     Objective:  Physical Exam: Vascular: DP/PT pulses 2/4 left. CFT <3 seconds. Absent hair growth on digits.  Edema noted to leftt lower extremities. Xerosis noted .  Skin. No lacerations or abrasions bilateral feet. Nails 1-5 on left are thickened discolored and elongated with subungual debris.  Hyperkeratotic lesion noted to distal second digit on left.  Musculoskeletal: MMT 5/5 bilateral lower extremities in DF, PF, Inversion and Eversion. Deceased ROM in DF of ankle joint. BKA on right.  Neurological: Sensation intact to light touch. Protective sensation diminished bilateral.    Assessment:   1. Pain due to onychomycosis of toenails of both feet   2. Uncontrolled type 2 diabetes mellitus with hyperglycemia, with long-term current use of insulin ([place])   3. Pre-ulcerative calluses      Plan:  Patient was evaluated and treated and all questions answered. -Discussed and educated patient on diabetic foot care, especially with  regards to the vascular, neurological and musculoskeletal systems.  -Stressed the importance of good glycemic control and the detriment of not  controlling glucose levels in relation to the foot. -Discussed supportive shoes at all times and checking feet regularly.  -Mechanically debrided all nails 1-5 bilateral using sterile nail nipper and filed with dremel without incident  -Hyperkeratotic lesion debrided with  chisel without incident.  -Answered all patient questions -Patient to return  in 3 months for at risk foot care -Patient advised to call the office if any problems or questions arise in the meantime.   Lorenda Peck, DPM

## 2022-08-25 NOTE — Telephone Encounter (Signed)
Patient dropped off DMV form to fill out  He said he would like to be called when it is ready for pick up  Placed in [place] bin up front

## 2022-08-28 NOTE — Telephone Encounter (Signed)
Pt on his way over to pickup now

## 2022-08-28 NOTE — Telephone Encounter (Signed)
Form given to provider 

## 2022-08-30 ENCOUNTER — Telehealth (HOSPITAL_COMMUNITY): Payer: Self-pay

## 2022-08-30 ENCOUNTER — Other Ambulatory Visit (HOSPITAL_COMMUNITY): Payer: Self-pay

## 2022-08-30 NOTE — Telephone Encounter (Signed)
Advanced Heart Failure Patient Advocate Encounter  Prior authorization is required for Entresto. PA submitted and APPROVED on 08/30/22.  Key [id number] Effective: 08/30/22 - 08/30/25  Clista Bernhardt, CPhT Rx Patient Advocate Phone: [phone]

## 2022-09-04 ENCOUNTER — Emergency Department (HOSPITAL_BASED_OUTPATIENT_CLINIC_OR_DEPARTMENT_OTHER): Payer: No Typology Code available for payment source

## 2022-09-04 ENCOUNTER — Other Ambulatory Visit: Payer: Self-pay

## 2022-09-04 ENCOUNTER — Encounter (HOSPITAL_BASED_OUTPATIENT_CLINIC_OR_DEPARTMENT_OTHER): Payer: Self-pay

## 2022-09-04 DIAGNOSIS — Z7902 Long term (current) use of antithrombotics/antiplatelets: Secondary | ICD-10-CM | POA: Diagnosis not present

## 2022-09-04 DIAGNOSIS — I110 Hypertensive heart disease with heart failure: Secondary | ICD-10-CM | POA: Diagnosis not present

## 2022-09-04 DIAGNOSIS — I5022 Chronic systolic (congestive) heart failure: Secondary | ICD-10-CM | POA: Insufficient documentation

## 2022-09-04 DIAGNOSIS — S81812A Laceration without foreign body, left lower leg, initial encounter: Secondary | ICD-10-CM | POA: Insufficient documentation

## 2022-09-04 DIAGNOSIS — X58XXXA Exposure to other specified factors, initial encounter: Secondary | ICD-10-CM | POA: Insufficient documentation

## 2022-09-04 DIAGNOSIS — Z794 Long term (current) use of insulin: Secondary | ICD-10-CM | POA: Insufficient documentation

## 2022-09-04 DIAGNOSIS — Z79899 Other long term (current) drug therapy: Secondary | ICD-10-CM | POA: Insufficient documentation

## 2022-09-04 DIAGNOSIS — E119 Type 2 diabetes mellitus without complications: Secondary | ICD-10-CM | POA: Diagnosis not present

## 2022-09-04 DIAGNOSIS — I2510 Atherosclerotic heart disease of native coronary artery without angina pectoris: Secondary | ICD-10-CM | POA: Insufficient documentation

## 2022-09-04 DIAGNOSIS — Z7982 Long term (current) use of aspirin: Secondary | ICD-10-CM | POA: Diagnosis not present

## 2022-09-04 LAB — CBC WITH DIFFERENTIAL/PLATELET
Abs Immature Granulocytes: 0.03 10*3/uL (ref 0.00–0.07)
Basophils Absolute: 0 10*3/uL (ref 0.0–0.1)
Basophils Relative: 0 %
Eosinophils Absolute: 0.1 10*3/uL (ref 0.0–0.5)
Eosinophils Relative: 1 %
HCT: 36.4 % — ABNORMAL LOW (ref 39.0–52.0)
Hemoglobin: 12.1 g/dL — ABNORMAL LOW (ref 13.0–17.0)
Immature Granulocytes: 0 %
Lymphocytes Relative: 14 %
Lymphs Abs: 1.3 10*3/uL (ref 0.7–4.0)
MCH: 29.8 pg (ref 26.0–34.0)
MCHC: 33.2 g/dL (ref 30.0–36.0)
MCV: 89.7 fL (ref 80.0–100.0)
Monocytes Absolute: 0.6 10*3/uL (ref 0.1–1.0)
Monocytes Relative: 6 %
Neutro Abs: 6.9 10*3/uL (ref 1.7–7.7)
Neutrophils Relative %: 79 %
Platelets: 133 10*3/uL — ABNORMAL LOW (ref 150–400)
RBC: 4.06 MIL/uL — ABNORMAL LOW (ref 4.22–5.81)
RDW: 12.7 % (ref 11.5–15.5)
WBC: 9 10*3/uL (ref 4.0–10.5)
nRBC: 0 % (ref 0.0–0.2)

## 2022-09-04 LAB — COMPREHENSIVE METABOLIC PANEL
ALT: 10 U/L (ref 0–44)
AST: 15 U/L (ref 15–41)
Albumin: 4.3 g/dL (ref 3.5–5.0)
Alkaline Phosphatase: 69 U/L (ref 38–126)
Anion gap: 12 (ref 5–15)
BUN: 23 mg/dL — ABNORMAL HIGH (ref 6–20)
CO2: 22 mmol/L (ref 22–32)
Calcium: 9.5 mg/dL (ref 8.9–10.3)
Chloride: 105 mmol/L (ref 98–111)
Creatinine, Ser: 1.27 mg/dL — ABNORMAL HIGH (ref 0.61–1.24)
GFR, Estimated: >60 mL/min (ref >60)
Glucose, Bld: 335 mg/dL — ABNORMAL HIGH (ref 70–99)
Potassium: 3.8 mmol/L (ref 3.5–5.1)
Sodium: 139 mmol/L (ref 135–145)
Total Bilirubin: 0.4 mg/dL (ref 0.3–1.2)
Total Protein: 7.6 g/dL (ref 6.5–8.1)

## 2022-09-04 LAB — LACTIC ACID, PLASMA: Lactic Acid, Venous: 2.8 mmol/L (ref 0.5–1.9)

## 2022-09-04 NOTE — ED Triage Notes (Signed)
Pt states to ED via pov from home. Pt states he washed left foot with bleach and had tingling in left foot and skin started coming off.  Pt recently being treated for infection in foot.

## 2022-09-04 NOTE — ED Notes (Signed)
EDP made aware of lactic acid of 2.8

## 2022-09-05 ENCOUNTER — Emergency Department (HOSPITAL_BASED_OUTPATIENT_CLINIC_OR_DEPARTMENT_OTHER)
Admission: EM | Admit: 2022-09-05 | Discharge: 2022-09-05 | Disposition: A | Discharge status: Home / Self Care | Payer: No Typology Code available for payment source | Attending: Emergency Medicine | Admitting: Emergency Medicine

## 2022-09-05 DIAGNOSIS — S81812A Laceration without foreign body, left lower leg, initial encounter: Secondary | ICD-10-CM

## 2022-09-05 LAB — URINALYSIS, ROUTINE W REFLEX MICROSCOPIC
Bacteria, UA: NONE SEEN
Bilirubin Urine: NEGATIVE
Glucose, UA: 500 mg/dL — AB
Ketones, ur: NEGATIVE mg/dL
Leukocytes,Ua: NEGATIVE
Nitrite: NEGATIVE
Protein, ur: 30 mg/dL — AB
Specific Gravity, Urine: 1.011 (ref 1.005–1.030)
pH: 5.5 (ref 5.0–8.0)

## 2022-09-05 MED ORDER — CEPHALEXIN 250 MG PO CAPS
500.0000 mg | ORAL_CAPSULE | Freq: Once | ORAL | Status: AC
  Administered 2022-09-05: 500 mg via ORAL
  Filled 2022-09-05: qty 2

## 2022-09-05 MED ORDER — CEPHALEXIN 500 MG PO CAPS: 500.0000 mg | ORAL_CAPSULE | Freq: Four times a day (QID) | ORAL | 0 refills | Status: DC
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
## 2022-09-07 ENCOUNTER — Ambulatory Visit (INDEPENDENT_AMBULATORY_CARE_PROVIDER_SITE_OTHER): Payer: No Typology Code available for payment source | Admitting: Podiatry

## 2022-09-07 ENCOUNTER — Encounter: Payer: Self-pay | Admitting: Podiatry

## 2022-09-07 DIAGNOSIS — E11621 Type 2 diabetes mellitus with foot ulcer: Secondary | ICD-10-CM

## 2022-09-07 DIAGNOSIS — E08621 Diabetes mellitus due to underlying condition with foot ulcer: Secondary | ICD-10-CM

## 2022-09-07 DIAGNOSIS — L97522 Non-pressure chronic ulcer of other part of left foot with fat layer exposed: Secondary | ICD-10-CM
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
## 2022-09-14 ENCOUNTER — Other Ambulatory Visit (HOSPITAL_COMMUNITY): Payer: Self-pay

## 2022-09-20 ENCOUNTER — Ambulatory Visit (INDEPENDENT_AMBULATORY_CARE_PROVIDER_SITE_OTHER): Payer: No Typology Code available for payment source | Admitting: Podiatry

## 2022-09-20 ENCOUNTER — Encounter: Payer: Self-pay | Admitting: Podiatry

## 2022-09-20 DIAGNOSIS — E0843 Diabetes mellitus due to underlying condition with diabetic autonomic (poly)neuropathy: Secondary | ICD-10-CM | POA: Diagnosis not present

## 2022-09-20 DIAGNOSIS — L97522 Non-pressure chronic ulcer of other part of left foot with fat layer exposed: Secondary | ICD-10-CM

## 2022-09-20 DIAGNOSIS — E119 Type 2 diabetes mellitus without complications: Secondary | ICD-10-CM | POA: Diagnosis not present
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
## 2022-09-21 ENCOUNTER — Other Ambulatory Visit: Payer: Self-pay | Admitting: Podiatry

## 2022-09-21 ENCOUNTER — Ambulatory Visit: Payer: No Typology Code available for payment source | Admitting: Podiatry

## 2022-09-21 ENCOUNTER — Telehealth: Payer: Self-pay | Admitting: *Deleted

## 2022-09-21 MED ORDER — CEPHALEXIN 500 MG PO CAPS: 500.0000 mg | ORAL_CAPSULE | Freq: Four times a day (QID) | ORAL | 0 refills | Status: DC

## 2022-09-21 NOTE — Telephone Encounter (Signed)
Sent Keflex to the pharmacy. Literally yesterday it looked fine, pics in chart. Recommend extra strength tylenol PRN. -Dr. Amalia Hailey

## 2022-09-21 NOTE — Telephone Encounter (Signed)
Patient is calling because the left great toe ulcer has started to bleed and has become very painful,daughter changed  bandages and applied  ointment before he left for work, now a coworker is changing the bandages again,has soaked thru, is asking for antibiotic, something for the pain.Suggested that he keep the foot elevated as much as possible and to watch for excessive bleeding until he hears back from physician, verbalized understanding.

## 2022-09-22 NOTE — Telephone Encounter (Signed)
Patient updated.

## 2022-09-28 ENCOUNTER — Telehealth: Payer: Self-pay | Admitting: Family Medicine

## 2022-09-28 NOTE — Telephone Encounter (Signed)
Pt states he is completely out of rx and his dr has retired and is waiting to see his new dr to start filling his diabetes rx. He asked if we could please give him a call as soon as possible since he really needs this and does not want to end up in the hospital.   He stated his rx is basaglar insulin injection   [facility], [address], [place] [address] Phone: [phone]  Fax: [phone]

## 2022-09-29 MED ORDER — BASAGLAR KWIKPEN 100 UNIT/ML ~~LOC~~ SOPN: 42.0000 [IU] | PEN_INJECTOR | Freq: Every day | SUBCUTANEOUS | 3 refills | Status: DC

## 2022-09-29 NOTE — Telephone Encounter (Signed)
Is he speaking about Endocrinology? Are you okay with filling this?

## 2022-09-29 NOTE — Telephone Encounter (Signed)
Called [facility] to clarify his insulin regimen Pt is using Basglar 42 units daily- will refill for him

## 2022-10-09 ENCOUNTER — Other Ambulatory Visit: Payer: Self-pay

## 2022-10-09 ENCOUNTER — Ambulatory Visit (INDEPENDENT_AMBULATORY_CARE_PROVIDER_SITE_OTHER): Payer: No Typology Code available for payment source | Admitting: Orthopedic Surgery

## 2022-10-09 ENCOUNTER — Emergency Department (HOSPITAL_BASED_OUTPATIENT_CLINIC_OR_DEPARTMENT_OTHER): Payer: No Typology Code available for payment source

## 2022-10-09 ENCOUNTER — Encounter (HOSPITAL_BASED_OUTPATIENT_CLINIC_OR_DEPARTMENT_OTHER): Payer: Self-pay | Admitting: *Deleted

## 2022-10-09 DIAGNOSIS — E119 Type 2 diabetes mellitus without complications: Secondary | ICD-10-CM | POA: Insufficient documentation

## 2022-10-09 DIAGNOSIS — Z7902 Long term (current) use of antithrombotics/antiplatelets: Secondary | ICD-10-CM | POA: Diagnosis not present

## 2022-10-09 DIAGNOSIS — Z89511 Acquired absence of right leg below knee: Secondary | ICD-10-CM | POA: Diagnosis not present

## 2022-10-09 DIAGNOSIS — Z794 Long term (current) use of insulin: Secondary | ICD-10-CM | POA: Diagnosis not present

## 2022-10-09 DIAGNOSIS — M79672 Pain in left foot: Secondary | ICD-10-CM | POA: Insufficient documentation

## 2022-10-09 DIAGNOSIS — Z7982 Long term (current) use of aspirin: Secondary | ICD-10-CM | POA: Insufficient documentation

## 2022-10-09 NOTE — ED Triage Notes (Signed)
Pt has pain in left foot.  Pt has hx of amputation and has had right BKA in 2021 and has difficulty with his left foot and was seen by Dr. Sharol Given today but has pain in left great toe and the toe next to it.  Pt has sores on his feet and was told my Dr. Sharol Given to get new shoes, pt would like to be evaluated for the foot pain.  No fever or chills, feet not draining.

## 2022-10-10 ENCOUNTER — Emergency Department (HOSPITAL_BASED_OUTPATIENT_CLINIC_OR_DEPARTMENT_OTHER)
Admission: EM | Admit: 2022-10-10 | Discharge: 2022-10-10 | Disposition: A | Discharge status: Home / Self Care | Payer: No Typology Code available for payment source | Attending: Emergency Medicine | Admitting: Emergency Medicine

## 2022-10-10 DIAGNOSIS — M79672 Pain in left foot: Secondary | ICD-10-CM

## 2022-10-10 MED ORDER — ACETAMINOPHEN 500 MG PO TABS
1000.0000 mg | ORAL_TABLET | Freq: Once | ORAL | Status: AC
  Administered 2022-10-10: 1000 mg via ORAL
  Filled 2022-10-10: qty 2

## 2022-10-10 NOTE — ED Provider Notes (Signed)
[facility]  Provider Note  CSN: [id number] Arrival date & time: 10/09/22 2224  History Chief Complaint  Patient presents with   Foot Pain    Mitchell [name] is a 52 y.o. male with history of DM and prior R BKA has had issues with his left foot more recently. Saw Podiatry a couple of weeks ago for debridement of calloses on his dorsal L 1st and 2nd toe. He was in Dr. Jess Barters office today for follow up from his amputation and had calloses removed from the tips of those same toes. He went home and reports he has had increased pain in both toes since then. No drainage or bleeding. No new falls or injuries. He would like a note to stay out of work until his follow up with Podiatry later this week.    Home Medications Prior to Admission medications   Medication Sig Start Date End Date Taking? Authorizing Provider  Accu-Chek Softclix Lancets lancets Use to test blood sugars up to 4 times daily as needed. 12/08/21   [name], MD  acetaminophen-codeine (TYLENOL #3) 300-30 MG tablet Take 1 tablet by mouth every 6 (six) hours as needed for moderate pain. 06/15/22   [name], [name], MD  amitriptyline (ELAVIL) 10 MG tablet Take 1 tablet (10 mg total) by mouth at bedtime. 07/26/22   [name], [name], MD  Ascorbic Acid (VITAMIN C PO) Take 1 tablet by mouth daily.    [provider]  aspirin 81 MG EC tablet Take 1 tablet (81 mg total) by mouth daily. 04/13/21   Danford, Suann Larry, MD  benzonatate (TESSALON) 100 MG capsule Take 1 capsule (100 mg total) by mouth every 8 (eight) hours. 07/09/22   Regan Lemming, MD  blood glucose meter kit and supplies Dispense based on patient and insurance preference. Use up to four times daily as directed. (FOR ICD-10 E10.9, E11.9). 06/15/22   [name], [name], MD  Blood Glucose Monitoring Suppl (BLOOD GLUCOSE MONITOR SYSTEM) w/Device KIT Use to test blood sugar up to 4 times daily as needed. 12/08/21    [name], MD  carvedilol (COREG) 6.25 MG tablet TAKE 1 TABLET BY MOUTH TWICE DAILY WITH A MEAL 05/03/22   [name], [name], MD  cephALEXin (KEFLEX) 500 MG capsule Take 1 capsule (500 mg total) by mouth 4 (four) times daily. 09/21/22   Edrick Kins, DPM  Cholecalciferol (VITAMIN D-3 PO) Take 1 capsule by mouth daily.    [provider]  clopidogrel (PLAVIX) 75 MG tablet Take 1 tablet (75 mg total) by mouth daily. 07/26/22   Bensimhon, Shaune Pascal, MD  gabapentin (NEURONTIN) 100 MG capsule Take 1 capsule (100 mg total) by mouth 2 (two) times daily. 07/26/22   [name], [name], MD  glucose blood (ACCU-CHEK GUIDE) test strip Use to test blood sugars up to 4 times daily as needed. 12/08/21   [name], MD  Insulin Glargine [facility] 100 UNIT/ML Inject 42 Units into the skin daily. 09/29/22   [name], [name], MD  methocarbamol (ROBAXIN) 500 MG tablet Take 1 tablet (500 mg total) by mouth every 6 (six) hours as needed for muscle spasms. 07/26/22   [name], [name], MD  oxyCODONE (OXY IR/ROXICODONE) 5 MG immediate release tablet Take 0.5-1 tablets (2.5-5 mg total) by mouth every 6 (six) hours as needed for severe pain. 04/13/22   Shelda Pal, DO  pantoprazole (PROTONIX) 40 MG tablet Take 1 tablet (40 mg total)  by mouth daily as needed (acid reflux). 02/13/22   [name], [name], MD  potassium chloride SA (KLOR-CON M) 20 MEQ tablet Take 1 tablet (20 mEq total) by mouth daily. 05/05/22   [place], Maricela Bo, FNP  rosuvastatin (CRESTOR) 40 MG tablet Take 1 tablet (40 mg total) by mouth daily. 07/26/22   Bensimhon, Shaune Pascal, MD  sacubitril-valsartan (ENTRESTO) 97-103 MG Take 1 tablet by mouth 2 (two) times daily. 06/29/22   Larey Dresser, MD  spironolactone (ALDACTONE) 25 MG tablet Take 1 tablet (25 mg total) by mouth daily. 07/26/22   Bensimhon, Shaune Pascal, MD  torsemide (DEMADEX) 20 MG tablet Take 2 tablets (40 mg total) by mouth daily. 04/19/22   [place], Maricela Bo,  FNP     Allergies    [name] [dapagliflozin], Nsaids, and Trulicity [dulaglutide]   Review of Systems   Review of Systems Please see HPI for pertinent positives and negatives  Physical Exam BP 128/84   Pulse 83   Temp 98.4 F (36.9 C)   Resp 18   Ht '5\' 11"'$  (1.803 m)   Wt 131.1 kg   SpO2 98%   BMI 40.31 kg/m   Physical Exam Vitals and nursing note reviewed.  HENT:     Head: Normocephalic.     Nose: Nose normal.  Eyes:     Extraocular Movements: Extraocular movements intact.  Cardiovascular:     Pulses: Normal pulses.  Pulmonary:     Effort: Pulmonary effort is normal.  Musculoskeletal:        General: Normal range of motion.     Cervical back: Neck supple.     Comments: S/P R BKA, L foot with thickened skin on 1st and 2nd toes both dorsally and distally. No signs of infection or trauma.   Skin:    Findings: No rash (on exposed skin).  Neurological:     Mental Status: He is alert and oriented to person, place, and time.  Psychiatric:        Mood and Affect: Mood normal.     ED Results / Procedures / Treatments   EKG None  Procedures Procedures  Medications Ordered in the ED Medications  acetaminophen (TYLENOL) tablet 1,000 mg (has no administration in time range)    Initial Impression and Plan  Patient here with foot pain after recent podiatric procedures. Exam is reassuring. I personally viewed the images from radiology studies and agree with radiologist interpretation: Xray is neg for fracture or signs of deep tissue infection. APAP for pain, work note for the next 2 days. Podiatry follow up already scheduled for later this week.   ED Course       MDM Rules/Calculators/A&P Medical Decision Making Problems Addressed: Left foot pain: chronic illness or injury with exacerbation, progression, or side effects of treatment  Amount and/or Complexity of Data Reviewed Radiology: ordered and independent interpretation performed. Decision-making details  documented in ED Course.  Risk OTC drugs.     Final Clinical Impression(s) / ED Diagnoses Final diagnoses:  Left foot pain    Rx / DC Orders ED Discharge Orders     None        Truddie Hidden, MD 10/10/22 [phone]

## 2022-10-11 ENCOUNTER — Encounter: Payer: Self-pay | Admitting: Orthopedic Surgery

## 2022-10-11 ENCOUNTER — Ambulatory Visit (INDEPENDENT_AMBULATORY_CARE_PROVIDER_SITE_OTHER): Payer: No Typology Code available for payment source | Admitting: Podiatry

## 2022-10-11 ENCOUNTER — Encounter: Payer: Self-pay | Admitting: Podiatry

## 2022-10-11 DIAGNOSIS — L97522 Non-pressure chronic ulcer of other part of left foot with fat layer exposed: Secondary | ICD-10-CM | POA: Diagnosis not present

## 2022-10-11 NOTE — Progress Notes (Signed)
Chief Complaint  Patient presents with   Wound Check    Subjective:  52 y.o. male with PMHx of diabetes mellitus and past surgical history of BKA RLE presenting for evaluation of ulcers to the left great toe and second digit.  He has been applying triple antibiotic and dressings daily and wearing a surgical shoe.  No new complaints at this time   Past Medical History:  Diagnosis Date   Bipolar disorder ([place])    CAD (coronary artery disease)    a. diffuse 3v CAD by cath 2019, medical therapy recommended.   Cataract    forming    Chronic systolic CHF (congestive heart failure) (HCC)    CVA (cerebral infarction)    No residual deficits   Depression    PTSD,    Diabetes mellitus    TYPE II; UNCONTROLLED BY HEMOGLOBIN A1c; STABLE AS  PER DISCHARGE   Headache(784.0)    Herpes simplex of male genitalia    History of colonic polyps    Hyperlipidemia    Hypertension    Myocardial infarction ([place]) 1987   (while playing football)   Obesity    OSA (obstructive sleep apnea)    repeat study 2018 without significant OSA   Pneumonia    Post-cardiac injury syndrome ([place])    History of cardiac injury from blunt trauma   Pulmonary hypertension ([place])    a. moderately elevated PASP 07/2019.   PVCs (premature ventricular contractions)    Schizophrenia ([place])    Goes to [facility]   Sleep apnea    Stroke [facility]) 2005   some left side weakness   Syncope    Recurrent, thought to be vasovagal. Also has h/o frequent PVCs.     Past Surgical History:  Procedure Laterality Date   AMPUTATION Right 01/25/2021   Procedure: RIGHT BELOW KNEE AMPUTATION;  Surgeon: [name], MD;  Location: [place] [facility];  Service: Orthopedics;  Laterality: Right;   CARDIAC CATHETERIZATION  12/19/10   DIFFUSE NONOBSTRUCTIVE CAD; NONISCHEMIC CARDIOMYOPATHY; LEFT VENTRICULAR ANGIOGRAM WAS PERFORMED SECONDARY TO  ELEVATED LEFT VENTRICULAR FILLING PRESSURES   COLONOSCOPY     ~ age [age]   COLONOSCOPY W/  POLYPECTOMY     I & D EXTREMITY Bilateral 10/24/2019   Procedure: IRRIGATION AND DEBRIDEMENT BILATERAL EXTREMITY WOUND ON FOOT;  Surgeon: Cindra Presume, MD;  Location: [facility];  Service: Plastics;  Laterality: Bilateral;   ICD IMPLANT N/A 01/13/2022   Procedure: ICD IMPLANT;  Surgeon: Quantina Dershem Lance, MD;  Location: [place] [facility];  Service: Cardiovascular;  Laterality: N/A;   METATARSAL HEAD EXCISION Right 08/29/2018   Procedure: METATARSAL HEAD RESECTION;  Surgeon: Edrick Kins, DPM;  Location: [place] [facility];  Service: Podiatry;  Laterality: Right;   METATARSAL OSTEOTOMY Right 08/29/2018   Procedure: SUB FIFTHE METATARSIA RIGHT FOOT;  Surgeon: Edrick Kins, DPM;  Location: [facility];  Service: Podiatry;  Laterality: Right;   MULTIPLE EXTRACTIONS WITH ALVEOLOPLASTY  01/27/2014   "all my teeth; 4 Quadrants of alveoloplasty   MULTIPLE EXTRACTIONS WITH ALVEOLOPLASTY N/A 01/27/2014   Procedure: EXTRACTION OF TEETH #'1, 2, 3, 4, 5, 6, 7, 8, 9, 10, 11, 12, 13, 14, 15, 16, 17, 20, 21, 22, 23, 24, 25, 26, 27, 28, 29, 31 and 32 WITH ALVEOLOPLASTY;  Surgeon: Lenn Cal, DDS;  Location: [facility];  Service: Oral Surgery;  Laterality: N/A;   ORIF FINGER / THUMB FRACTURE Right    POLYPECTOMY     RIGHT HEART  CATH N/A 01/27/2021   Procedure: RIGHT HEART CATH;  Surgeon: Jolaine Artist, MD;  Location: [facility];  Service: Cardiovascular;  Laterality: N/A;   RIGHT/LEFT HEART CATH AND CORONARY ANGIOGRAPHY N/A 09/26/2017   Procedure: RIGHT/LEFT HEART CATH AND CORONARY ANGIOGRAPHY;  Surgeon: Jolaine Artist, MD;  Location: [place] [facility];  Service: Cardiovascular;  Laterality: N/A;   RIGHT/LEFT HEART CATH AND CORONARY ANGIOGRAPHY N/A 04/12/2021   Procedure: RIGHT/LEFT HEART CATH AND CORONARY ANGIOGRAPHY;  Surgeon: Jolaine Artist, MD;  Location: [place] [facility];  Service: Cardiovascular;  Laterality: N/A;   SKIN SPLIT GRAFT Bilateral 10/24/2019   Procedure: SKIN GRAFT SPLIT THICKNESS LEFT THIGH;   Surgeon: Cindra Presume, MD;  Location: [facility];  Service: Plastics;  Laterality: Bilateral;   WOUND DEBRIDEMENT Right 08/29/2018   Procedure: Debridement of ulcer on right fifth metatarsal;  Surgeon: Edrick Kins, DPM;  Location: [facility];  Service: Podiatry;  Laterality: Right;    Allergies  Allergen Reactions   Farxiga [Dapagliflozin] Other (See Comments)    Patient reports loss of consciousness   Nsaids Anaphylaxis and Rash    Able to tolerate aspirin    Trulicity [Dulaglutide] Nausea And Vomiting     LT foot 09/20/2022   LT foot 10/11/2022  Objective/Physical Exam General: The patient is alert and oriented x3 in no acute distress.  Dermatology:  Wound noted to the dorsal IPJ of the left great toe as well as the second digit of the left foot.  Overall significant improvement.  No drainage noted.  Good potential for healing  Stable wound base.  There is no exposed bone muscle tendon ligament or joint.  Neurovascular status intact left lower extremity  Musculoskeletal Exam: History of BKA RLE  Radiographic exam 09/04/2022 LT foot: Normal osseous mineralization.  No erosive changes or concern for osteomyelitis to the digits.  No acute fractures.  Assessment: 1.  Ulcers left great toe and second digit secondary to diabetes mellitus 2. diabetes mellitus w/ peripheral neuropathy 3.  BKA RLE   Plan of Care:  1. Patient was evaluated.   2.  Light excisional debridement of the left toe ulcers was performed today using a tissue nipper.  Overall significant improvement.  Continue to keep the wounds dry 3. Dr. Sharol Given had recommended new balance walking shoes with a deep toe box area.  I agree.  Recommend new balance walking shoes at the [place] [facility].  Patient has follow-up with Dr. Sharol Given in 4 weeks.  Recommend follow-up with myself as needed  Edrick Kins, DPM [facility]  Dr. Edrick Kins, DPM    [address], [place] [address]                Office [phone]  Fax [phone]

## 2022-10-11 NOTE — Progress Notes (Signed)
Office Visit Note   Patient: Mitchell [name]           Date of Birth: 08/23/1970           MRN: [id number] Visit Date: 10/09/2022              Requested by: [name], MD [address] [place],  [place] [address] PCP: [name], MD  Chief Complaint  Patient presents with   Right Leg - Follow-up    HX BKA       HPI: Patient is a 52 year old gentleman who is seen status post right transtibial amputation.  He is working with Museum/gallery curator on modifications of the socket.  He currently is in a new leg.  Assessment & Plan: Visit Diagnoses:  1. S/P below knee amputation, right ([place])     Plan: Recommended a extra-depth shoe and remove the insert to give more dorsal space for the claw toes.  Follow-Up Instructions: Return in about 3 months (around 01/09/2023).   Ortho Exam  Patient is alert, oriented, no adenopathy, well-dressed, normal affect, normal respiratory effort. Examination patient has no ulcers she has a stable right transtibial amputation.  Examination of the left foot he has healing ulcers on the dorsum of the great toe and second toe.  He has some plantar calluses was debrided on the plantar aspect of the great toe and second toe.  There is no cellulitis no odor no drainage.  Imaging: No results found. No images are attached to the encounter.  Labs: Lab Results  Component Value Date   HGBA1C 11.8 (H) 06/15/2022   HGBA1C 7.8 (A) 08/19/2021   HGBA1C 7.9 (A) 06/08/2021   ESRSEDRATE 20 (H) 03/08/2022   ESRSEDRATE 81 (H) 10/21/2019   ESRSEDRATE 25 (H) 06/17/2018   CRP 10.3 (H) 01/28/2021   CRP 12.8 (H) 01/27/2021   CRP 21.1 (H) 01/26/2021   REPTSTATUS 01/25/2021 FINAL 01/24/2021   GRAMSTAIN  08/29/2018    RARE WBC PRESENT, PREDOMINANTLY PMN NO ORGANISMS SEEN    CULT  01/24/2021    NO GROWTH Performed at [facility], [place] [address], [place],  [address]    LABORGA KLEBSIELLA PNEUMONIAE 05/21/2018   LABORGA ENTEROBACTER  SPECIES 05/21/2018   LABORGA STAPHYLOCOCCUS AUREUS 05/21/2018     Lab Results  Component Value Date   ALBUMIN 4.3 09/04/2022   ALBUMIN 4.3 07/26/2022   ALBUMIN 3.8 03/15/2022   PREALBUMIN 6.8 (L) 10/21/2019    Lab Results  Component Value Date   MG 2.3 11/29/2021   MG 1.5 (L) 04/12/2021   MG 1.8 02/16/2021   Lab Results  Component Value Date   VD25OH 11 (L) 08/16/2009    Lab Results  Component Value Date   PREALBUMIN 6.8 (L) 10/21/2019      Latest Ref Rng & Units 09/04/2022   10:20 PM 07/09/2022    3:55 PM 06/12/2022    9:41 PM  CBC EXTENDED  WBC 4.0 - 10.5 K/uL 9.0  10.4  8.9   RBC 4.22 - 5.81 MIL/uL 4.06  4.81  4.11   Hemoglobin 13.0 - 17.0 g/dL 12.1  13.8  12.0   HCT 39.0 - 52.0 % 36.4  42.4  36.2   Platelets 150 - 400 K/uL 133  125  120   NEUT# 1.7 - 7.7 K/uL 6.9     Lymph# 0.7 - 4.0 K/uL 1.3        There is no height or weight on  file to calculate BMI.  Orders:  No orders of the defined types were placed in this encounter.  No orders of the defined types were placed in this encounter.    Procedures: No procedures performed  Clinical Data: No additional findings.  ROS:  All other systems negative, except as noted in the HPI. Review of Systems  Objective: Vital Signs: There were no vitals taken for this visit.  Specialty Comments:  No specialty comments available.  PMFS History: Patient Active Problem List   Diagnosis Date Noted   ICD (implantable cardioverter-defibrillator) in place 04/19/2022   Near syncope 12/08/2021   CKD (chronic kidney disease) stage 3, GFR 30-59 ml/min ([place]) 12/08/2021   PVD s/p R LE amputation  12/08/2021   Diabetic retinopathy associated with type 2 diabetes mellitus ([place]) 06/06/2021   CAD (coronary artery disease) with flat troponin  04/11/2021   S/P below knee amputation, right ([place]) 02/07/2021   QT prolongation 07/30/2019   Acute on chronic combined systolic and diastolic CHF (congestive heart failure) ([place])  07/29/2019   Stroke ([place]) 05/08/2017   Uncontrolled type 2 diabetes mellitus with hyperglycemia, with long-term current use of insulin ([place]) [id number]   Chronic systolic heart failure ([place]) 10/02/2011   Erectile dysfunction 10/02/2011   Obstructive sleep apnea 10/11/2007   HLD (hyperlipidemia) 10/10/2007   HYPOKALEMIA 10/10/2007   Obesity, Class III, BMI 40-49.9 (morbid obesity) ([place]) 10/10/2007   Essential hypertension 10/10/2007   PREMATURE VENTRICULAR CONTRACTIONS 10/10/2007   COLONIC POLYPS, HX OF 10/10/2007   Past Medical History:  Diagnosis Date   Bipolar disorder ([place])    CAD (coronary artery disease)    a. diffuse 3v CAD by cath 2019, medical therapy recommended.   Cataract    forming    Chronic systolic CHF (congestive heart failure) (HCC)    CVA (cerebral infarction)    No residual deficits   Depression    PTSD,    Diabetes mellitus    TYPE II; UNCONTROLLED BY HEMOGLOBIN A1c; STABLE AS  PER DISCHARGE   Headache(784.0)    Herpes simplex of male genitalia    History of colonic polyps    Hyperlipidemia    Hypertension    Myocardial infarction ([place]) 1987   (while playing football)   Obesity    OSA (obstructive sleep apnea)    repeat study 2018 without significant OSA   Pneumonia    Post-cardiac injury syndrome ([place])    History of cardiac injury from blunt trauma   Pulmonary hypertension ([place])    a. moderately elevated PASP 07/2019.   PVCs (premature ventricular contractions)    Schizophrenia ([place])    Goes to [facility]   Sleep apnea    Stroke [facility]) 2005   some left side weakness   Syncope    Recurrent, thought to be vasovagal. Also has h/o frequent PVCs.     Family History  Problem Relation Age of Onset   Heart disease Mother        MI   Heart failure Mother    Diabetes Mother        ALSO IN MOST OF HIS SIBLINGS; 2 UNLCES HAVE ALSO PASSED AWAY FROM DM   Cardiomyopathy Mother    Cancer - Ovarian Mother    Ovarian cancer Mother     Heart disease Father    Hypertension Father    Diabetes Father    Diabetes Sister    Diabetes Brother    Colon cancer Paternal Uncle    Colon cancer  Paternal Uncle    Colon polyps Neg Hx    Esophageal cancer Neg Hx    Rectal cancer Neg Hx    Stomach cancer Neg Hx     Past Surgical History:  Procedure Laterality Date   AMPUTATION Right 01/25/2021   Procedure: RIGHT BELOW KNEE AMPUTATION;  Surgeon: [name], MD;  Location: [facility];  Service: Orthopedics;  Laterality: Right;   CARDIAC CATHETERIZATION  12/19/10   DIFFUSE NONOBSTRUCTIVE CAD; NONISCHEMIC CARDIOMYOPATHY; LEFT VENTRICULAR ANGIOGRAM WAS PERFORMED SECONDARY TO  ELEVATED LEFT VENTRICULAR FILLING PRESSURES   COLONOSCOPY     ~ age [age]   COLONOSCOPY W/ POLYPECTOMY     I & D EXTREMITY Bilateral 10/24/2019   Procedure: IRRIGATION AND DEBRIDEMENT BILATERAL EXTREMITY WOUND ON FOOT;  Surgeon: Cindra Presume, MD;  Location: [facility];  Service: Plastics;  Laterality: Bilateral;   ICD IMPLANT N/A 01/13/2022   Procedure: ICD IMPLANT;  Surgeon: Evans Lance, MD;  Location: [facility];  Service: Cardiovascular;  Laterality: N/A;   METATARSAL HEAD EXCISION Right 08/29/2018   Procedure: METATARSAL HEAD RESECTION;  Surgeon: Edrick Kins, DPM;  Location: [facility];  Service: Podiatry;  Laterality: Right;   METATARSAL OSTEOTOMY Right 08/29/2018   Procedure: SUB FIFTHE METATARSIA RIGHT FOOT;  Surgeon: Edrick Kins, DPM;  Location: [facility];  Service: Podiatry;  Laterality: Right;   MULTIPLE EXTRACTIONS WITH ALVEOLOPLASTY  01/27/2014   "all my teeth; 4 Quadrants of alveoloplasty   MULTIPLE EXTRACTIONS WITH ALVEOLOPLASTY N/A 01/27/2014   Procedure: EXTRACTION OF TEETH #'1, 2, 3, 4, 5, 6, 7, 8, 9, 10, 11, 12, 13, 14, 15, 16, 17, 20, 21, 22, 23, 24, 25, 26, 27, 28, 29, 31 and 32 WITH ALVEOLOPLASTY;  Surgeon: Lenn Cal, DDS;  Location: [facility];  Service: Oral Surgery;  Laterality: N/A;   ORIF FINGER / THUMB FRACTURE Right    POLYPECTOMY      RIGHT HEART CATH N/A 01/27/2021   Procedure: RIGHT HEART CATH;  Surgeon: Jolaine Artist, MD;  Location: [facility];  Service: Cardiovascular;  Laterality: N/A;   RIGHT/LEFT HEART CATH AND CORONARY ANGIOGRAPHY N/A 09/26/2017   Procedure: RIGHT/LEFT HEART CATH AND CORONARY ANGIOGRAPHY;  Surgeon: Jolaine Artist, MD;  Location: [facility];  Service: Cardiovascular;  Laterality: N/A;   RIGHT/LEFT HEART CATH AND CORONARY ANGIOGRAPHY N/A 04/12/2021   Procedure: RIGHT/LEFT HEART CATH AND CORONARY ANGIOGRAPHY;  Surgeon: Jolaine Artist, MD;  Location: [facility];  Service: Cardiovascular;  Laterality: N/A;   SKIN SPLIT GRAFT Bilateral 10/24/2019   Procedure: SKIN GRAFT SPLIT THICKNESS LEFT THIGH;  Surgeon: Cindra Presume, MD;  Location: [facility];  Service: Plastics;  Laterality: Bilateral;   WOUND DEBRIDEMENT Right 08/29/2018   Procedure: Debridement of ulcer on right fifth metatarsal;  Surgeon: Edrick Kins, DPM;  Location: [facility];  Service: Podiatry;  Laterality: Right;   Social History   Occupational History   Occupation: Mortician  Tobacco Use   Smoking status: Never   Smokeless tobacco: Never  Vaping Use   Vaping Use: Never used  Substance and Sexual Activity   Alcohol use: No   Drug use: No   Sexual activity: Not on file

## 2022-10-16 ENCOUNTER — Ambulatory Visit (INDEPENDENT_AMBULATORY_CARE_PROVIDER_SITE_OTHER): Payer: No Typology Code available for payment source

## 2022-10-16 DIAGNOSIS — I5022 Chronic systolic (congestive) heart failure: Secondary | ICD-10-CM | POA: Diagnosis not present

## 2022-10-18 LAB — CUP PACEART REMOTE DEVICE CHECK
Battery Remaining Longevity: 127 mo
Battery Voltage: 3.02 V
Brady Statistic RV Percent Paced: 0 %
Date Time Interrogation Session: 20240318052823
HighPow Impedance: 58 Ohm
Implantable Lead Connection Status: 753985
Implantable Lead Implant Date: 20230616
Implantable Lead Location: 753860
Implantable Lead Model: 6935
Implantable Pulse Generator Implant Date: 20230616
Lead Channel Impedance Value: 361 Ohm
Lead Channel Impedance Value: 475 Ohm
Lead Channel Pacing Threshold Amplitude: 0.625 V
Lead Channel Pacing Threshold Pulse Width: 0.4 ms
Lead Channel Sensing Intrinsic Amplitude: 24 mV
Lead Channel Sensing Intrinsic Amplitude: 24 mV
Lead Channel Setting Pacing Amplitude: 2 V
Lead Channel Setting Pacing Pulse Width: 0.4 ms
Lead Channel Setting Sensing Sensitivity: 0.3 mV
Zone Setting Status: 755011

## 2022-10-21 ENCOUNTER — Other Ambulatory Visit: Payer: Self-pay

## 2022-10-21 ENCOUNTER — Other Ambulatory Visit (HOSPITAL_COMMUNITY): Payer: Self-pay | Admitting: Family Medicine

## 2022-10-21 ENCOUNTER — Emergency Department (HOSPITAL_BASED_OUTPATIENT_CLINIC_OR_DEPARTMENT_OTHER): Payer: No Typology Code available for payment source

## 2022-10-21 ENCOUNTER — Encounter (HOSPITAL_BASED_OUTPATIENT_CLINIC_OR_DEPARTMENT_OTHER): Payer: Self-pay

## 2022-10-21 ENCOUNTER — Emergency Department (HOSPITAL_BASED_OUTPATIENT_CLINIC_OR_DEPARTMENT_OTHER)
Admission: EM | Admit: 2022-10-21 | Discharge: 2022-10-21 | Disposition: A | Discharge status: Home / Self Care | Payer: No Typology Code available for payment source | Source: Home / Self Care | Attending: Emergency Medicine | Admitting: Emergency Medicine

## 2022-10-21 DIAGNOSIS — I5022 Chronic systolic (congestive) heart failure: Secondary | ICD-10-CM | POA: Insufficient documentation

## 2022-10-21 DIAGNOSIS — E119 Type 2 diabetes mellitus without complications: Secondary | ICD-10-CM | POA: Insufficient documentation

## 2022-10-21 DIAGNOSIS — R110 Nausea: Secondary | ICD-10-CM | POA: Insufficient documentation

## 2022-10-21 DIAGNOSIS — I509 Heart failure, unspecified: Secondary | ICD-10-CM

## 2022-10-21 DIAGNOSIS — R748 Abnormal levels of other serum enzymes: Secondary | ICD-10-CM

## 2022-10-21 DIAGNOSIS — I5023 Acute on chronic systolic (congestive) heart failure: Secondary | ICD-10-CM | POA: Diagnosis not present

## 2022-10-21 DIAGNOSIS — R1010 Upper abdominal pain, unspecified: Secondary | ICD-10-CM | POA: Insufficient documentation

## 2022-10-21 DIAGNOSIS — Z794 Long term (current) use of insulin: Secondary | ICD-10-CM | POA: Insufficient documentation

## 2022-10-21 DIAGNOSIS — I110 Hypertensive heart disease with heart failure: Secondary | ICD-10-CM | POA: Insufficient documentation

## 2022-10-21 DIAGNOSIS — Z7982 Long term (current) use of aspirin: Secondary | ICD-10-CM | POA: Insufficient documentation

## 2022-10-21 DIAGNOSIS — N390 Urinary tract infection, site not specified: Secondary | ICD-10-CM | POA: Diagnosis not present

## 2022-10-21 DIAGNOSIS — Z79899 Other long term (current) drug therapy: Secondary | ICD-10-CM | POA: Insufficient documentation

## 2022-10-21 DIAGNOSIS — I2510 Atherosclerotic heart disease of native coronary artery without angina pectoris: Secondary | ICD-10-CM | POA: Insufficient documentation

## 2022-10-21 DIAGNOSIS — N179 Acute kidney failure, unspecified: Secondary | ICD-10-CM | POA: Diagnosis not present

## 2022-10-21 DIAGNOSIS — N183 Chronic kidney disease, stage 3 unspecified: Secondary | ICD-10-CM | POA: Diagnosis not present

## 2022-10-21 DIAGNOSIS — R109 Unspecified abdominal pain: Secondary | ICD-10-CM | POA: Diagnosis present

## 2022-10-21 DIAGNOSIS — Z6841 Body Mass Index (BMI) 40.0 and over, adult: Secondary | ICD-10-CM | POA: Diagnosis not present

## 2022-10-21 LAB — URINALYSIS, ROUTINE W REFLEX MICROSCOPIC
Bilirubin Urine: NEGATIVE
Glucose, UA: NEGATIVE mg/dL
Ketones, ur: NEGATIVE mg/dL
Nitrite: NEGATIVE
Protein, ur: NEGATIVE mg/dL
Specific Gravity, Urine: 1.01 (ref 1.005–1.030)
WBC, UA: >50 WBC/hpf (ref 0–5)
pH: 5 (ref 5.0–8.0)

## 2022-10-21 LAB — CBC WITH DIFFERENTIAL/PLATELET
Abs Immature Granulocytes: 0.03 10*3/uL (ref 0.00–0.07)
Basophils Absolute: 0 10*3/uL (ref 0.0–0.1)
Basophils Relative: 0 %
Eosinophils Absolute: 0.2 10*3/uL (ref 0.0–0.5)
Eosinophils Relative: 2 %
HCT: 40.8 % (ref 39.0–52.0)
Hemoglobin: 13.4 g/dL (ref 13.0–17.0)
Immature Granulocytes: 0 %
Lymphocytes Relative: 19 %
Lymphs Abs: 1.8 10*3/uL (ref 0.7–4.0)
MCH: 29.6 pg (ref 26.0–34.0)
MCHC: 32.8 g/dL (ref 30.0–36.0)
MCV: 90.3 fL (ref 80.0–100.0)
Monocytes Absolute: 0.6 10*3/uL (ref 0.1–1.0)
Monocytes Relative: 6 %
Neutro Abs: 6.8 10*3/uL (ref 1.7–7.7)
Neutrophils Relative %: 73 %
Platelets: 158 10*3/uL (ref 150–400)
RBC: 4.52 MIL/uL (ref 4.22–5.81)
RDW: 12.2 % (ref 11.5–15.5)
WBC: 9.5 10*3/uL (ref 4.0–10.5)
nRBC: 0 % (ref 0.0–0.2)

## 2022-10-21 LAB — COMPREHENSIVE METABOLIC PANEL
ALT: 11 U/L (ref 0–44)
AST: 17 U/L (ref 15–41)
Albumin: 4.2 g/dL (ref 3.5–5.0)
Alkaline Phosphatase: 70 U/L (ref 38–126)
Anion gap: 9 (ref 5–15)
BUN: 32 mg/dL — ABNORMAL HIGH (ref 6–20)
CO2: 26 mmol/L (ref 22–32)
Calcium: 10.1 mg/dL (ref 8.9–10.3)
Chloride: 105 mmol/L (ref 98–111)
Creatinine, Ser: 1.47 mg/dL — ABNORMAL HIGH (ref 0.61–1.24)
GFR, Estimated: 56 mL/min — ABNORMAL LOW (ref >60)
Glucose, Bld: 206 mg/dL — ABNORMAL HIGH (ref 70–99)
Potassium: 4.4 mmol/L (ref 3.5–5.1)
Sodium: 140 mmol/L (ref 135–145)
Total Bilirubin: 0.3 mg/dL (ref 0.3–1.2)
Total Protein: 8 g/dL (ref 6.5–8.1)

## 2022-10-21 LAB — BRAIN NATRIURETIC PEPTIDE: B Natriuretic Peptide: 181.3 pg/mL — ABNORMAL HIGH (ref 0.0–100.0)

## 2022-10-21 LAB — LIPASE, BLOOD: Lipase: 174 U/L — ABNORMAL HIGH (ref 11–51)

## 2022-10-21 LAB — CBG MONITORING, ED: Glucose-Capillary: 184 mg/dL — ABNORMAL HIGH (ref 70–99)

## 2022-10-21 MED ORDER — FUROSEMIDE 10 MG/ML IJ SOLN
40.0000 mg | Freq: Once | INTRAMUSCULAR | Status: AC
  Administered 2022-10-21: 40 mg via INTRAVENOUS
  Filled 2022-10-21: qty 4

## 2022-10-21 NOTE — ED Provider Notes (Signed)
[facility] Provider Note   CSN: [id number] Arrival date & time: 10/21/22  1914     History  Chief Complaint  Patient presents with   Abdominal Pain   Emesis    Mitchell [name] is a 52 y.o. male.   Abdominal Pain Associated symptoms: vomiting   Emesis Associated symptoms: abdominal pain   Patient presents with upper abdominal pain and some nausea.  States been out of some of his medicines.  States been out of them for 3 days some couple days and some he just took 1 today.  States he has been to the pharmacy and they are going to refill his medicines.  No chest pain.  States he does feel as if he is carrying extra fluid.  History of CHF.  Has not weighed himself today.  States his sugar was good at home.  States he was able to take his insulin.    Past Medical History:  Diagnosis Date   Bipolar disorder ([place])    CAD (coronary artery disease)    a. diffuse 3v CAD by cath 2019, medical therapy recommended.   Cataract    forming    Chronic systolic CHF (congestive heart failure) (HCC)    CVA (cerebral infarction)    No residual deficits   Depression    PTSD,    Diabetes mellitus    TYPE II; UNCONTROLLED BY HEMOGLOBIN A1c; STABLE AS  PER DISCHARGE   Headache(784.0)    Herpes simplex of male genitalia    History of colonic polyps    Hyperlipidemia    Hypertension    Myocardial infarction ([place]) 1987   (while playing football)   Obesity    OSA (obstructive sleep apnea)    repeat study 2018 without significant OSA   Pneumonia    Post-cardiac injury syndrome ([place])    History of cardiac injury from blunt trauma   Pulmonary hypertension ([place])    a. moderately elevated PASP 07/2019.   PVCs (premature ventricular contractions)    Schizophrenia ([place])    Goes to [facility]   Sleep apnea    Stroke [facility]) 2005   some left side weakness   Syncope    Recurrent, thought to be vasovagal. Also has h/o frequent PVCs.      Home Medications Prior to Admission medications   Medication Sig Start Date End Date Taking? Authorizing Provider  Accu-Chek Softclix Lancets lancets Use to test blood sugars up to 4 times daily as needed. 12/08/21   [name], MD  acetaminophen-codeine (TYLENOL #3) 300-30 MG tablet Take 1 tablet by mouth every 6 (six) hours as needed for moderate pain. 06/15/22   [name], [name], MD  amitriptyline (ELAVIL) 10 MG tablet Take 1 tablet (10 mg total) by mouth at bedtime. 07/26/22   [name], [name], MD  Ascorbic Acid (VITAMIN C PO) Take 1 tablet by mouth daily.    [provider]  aspirin 81 MG EC tablet Take 1 tablet (81 mg total) by mouth daily. 04/13/21   Danford, Suann Larry, MD  benzonatate (TESSALON) 100 MG capsule Take 1 capsule (100 mg total) by mouth every 8 (eight) hours. 07/09/22   Regan Lemming, MD  blood glucose meter kit and supplies Dispense based on patient and insurance preference. Use up to four times daily as directed. (FOR ICD-10 E10.9, E11.9). 06/15/22   [name], [name], MD  Blood Glucose Monitoring Suppl (BLOOD GLUCOSE MONITOR SYSTEM) w/Device KIT Use to test  blood sugar up to 4 times daily as needed. 12/08/21   [name], MD  carvedilol (COREG) 6.25 MG tablet TAKE 1 TABLET BY MOUTH TWICE DAILY WITH A MEAL 05/03/22   [name], [name], MD  cephALEXin (KEFLEX) 500 MG capsule Take 1 capsule (500 mg total) by mouth 4 (four) times daily. 09/21/22   Edrick Kins, DPM  Cholecalciferol (VITAMIN D-3 PO) Take 1 capsule by mouth daily.    [provider]  clopidogrel (PLAVIX) 75 MG tablet Take 1 tablet (75 mg total) by mouth daily. 07/26/22   Bensimhon, Shaune Pascal, MD  gabapentin (NEURONTIN) 100 MG capsule Take 1 capsule (100 mg total) by mouth 2 (two) times daily. 07/26/22   [name], [name], MD  glucose blood (ACCU-CHEK GUIDE) test strip Use to test blood sugars up to 4 times daily as needed. 12/08/21   [name], MD  Insulin Glargine  [facility]) 100 UNIT/ML Inject 42 Units into the skin daily. 09/29/22   [name], [name], MD  methocarbamol (ROBAXIN) 500 MG tablet Take 1 tablet (500 mg total) by mouth every 6 (six) hours as needed for muscle spasms. 07/26/22   [name], [name], MD  oxyCODONE (OXY IR/ROXICODONE) 5 MG immediate release tablet Take 0.5-1 tablets (2.5-5 mg total) by mouth every 6 (six) hours as needed for severe pain. 04/13/22   Shelda Pal, DO  pantoprazole (PROTONIX) 40 MG tablet Take 1 tablet (40 mg total) by mouth daily as needed (acid reflux). 02/13/22   [name], [name], MD  potassium chloride SA (KLOR-CON M) 20 MEQ tablet Take 1 tablet (20 mEq total) by mouth daily. 05/05/22   Milford, Maricela Bo, FNP  rosuvastatin (CRESTOR) 40 MG tablet Take 1 tablet (40 mg total) by mouth daily. 07/26/22   Bensimhon, Shaune Pascal, MD  sacubitril-valsartan (ENTRESTO) 97-103 MG Take 1 tablet by mouth 2 (two) times daily. 06/29/22   Larey Dresser, MD  spironolactone (ALDACTONE) 25 MG tablet Take 1 tablet (25 mg total) by mouth daily. 07/26/22   Bensimhon, Shaune Pascal, MD  torsemide (DEMADEX) 20 MG tablet Take 2 tablets (40 mg total) by mouth daily. 04/19/22   [place]ford, Maricela Bo, FNP      Allergies    Farxiga [dapagliflozin], Nsaids, and Trulicity [dulaglutide]    Review of Systems   Review of Systems  Gastrointestinal:  Positive for abdominal pain and vomiting.    Physical Exam Updated Vital Signs BP (!) 133/91   Pulse 85   Temp 97.6 F (36.4 C) (Oral)   Resp (!) 21   Ht 5\' 11"  (1.803 m)   Wt 135.2 kg   SpO2 97%   BMI 41.56 kg/m  Physical Exam Vitals and nursing note reviewed.  HENT:     Head: Normocephalic.  Cardiovascular:     Rate and Rhythm: Regular rhythm.  Pulmonary:     Breath sounds: No wheezing or rales.  Abdominal:     Hernia: No hernia is present.     Comments: Mild upper abdominal tenderness no rebound or guarding.  No hernias palpated.  Skin:    Capillary Refill: Capillary  refill takes less than 2 seconds.  Neurological:     Mental Status: He is alert and oriented to person, place, and time.   Edema left lower extremity.  Previous right below the knee amputation.  ED Results / Procedures / Treatments   Labs (all labs ordered are listed, but only abnormal results are displayed) Labs Reviewed  LIPASE, BLOOD - Abnormal;  Notable for the following components:      Result Value   Lipase 174 (*)    All other components within normal limits  COMPREHENSIVE METABOLIC PANEL - Abnormal; Notable for the following components:   Glucose, Bld 206 (*)    BUN 32 (*)    Creatinine, Ser 1.47 (*)    GFR, Estimated 56 (*)    All other components within normal limits  URINALYSIS, ROUTINE W REFLEX MICROSCOPIC - Abnormal; Notable for the following components:   APPearance HAZY (*)    Hgb urine dipstick TRACE (*)    Leukocytes,Ua LARGE (*)    Bacteria, UA MANY (*)    All other components within normal limits  BRAIN NATRIURETIC PEPTIDE - Abnormal; Notable for the following components:   B Natriuretic Peptide 181.3 (*)    All other components within normal limits  CBG MONITORING, ED - Abnormal; Notable for the following components:   Glucose-Capillary 184 (*)    All other components within normal limits  CBC WITH DIFFERENTIAL/PLATELET    EKG EKG Interpretation  Date/Time:  Saturday October 21 2022 20:38:29 EDT Ventricular Rate:  88 PR Interval:  201 QRS Duration: 134 QT Interval:  427 QTC Calculation: 517 R Axis:   -36 Text Interpretation: Sinus rhythm Left bundle branch block Confirmed by Davonna Belling [phone]) on 10/21/2022 9:38:01 PM  Radiology DG Chest Portable 1 View  Result Date: 10/21/2022 CLINICAL DATA:  Shortness of breath, generalized abdominal pain, nausea, vomiting EXAM: PORTABLE CHEST 1 VIEW COMPARISON:  Portable exam 2046 hours compared to 07/09/2022 FINDINGS: LEFT subclavian ICD with lead projecting over RIGHT ventricle. Upper normal heart size.  Mediastinal contours and pulmonary vascularity normal. Lungs clear. No pulmonary infiltrate, pleural effusion, or pneumothorax. Osseous structures unremarkable. IMPRESSION: No acute abnormalities. Electronically Signed   By: Lavonia Dana M.D.   On: 10/21/2022 20:52    Procedures Procedures    Medications Ordered in ED Medications  furosemide (LASIX) injection 40 mg (40 mg Intravenous Given 10/21/22 2219)    ED Course/ Medical Decision Making/ A&P                             Medical Decision Making Amount and/or Complexity of Data Reviewed Labs: ordered. Radiology: ordered.  Risk Prescription drug management.   Patient abdominal pain nausea.  Has been off some of his medicines for the last few days.  He is on fluid pills and pills.  States he has had his insulin.  Well-appearing.  Unsure of weight at this point however.  Did not weigh himself today and just told the nurse what he thought he weighed.  Unsure his fluid balance at this time.  Will check basic blood work.  Have reviewed note from heart failure clinic.  Weight is up today.  Will give dose of Lasix here.  However workup otherwise reassuring from a cardiac standpoint.  X-ray reassuring.  Tolerating orals and denies abdominal pain currently.  Will discharge with outpatient follow-up with the heart failure clinic.  However also has an elevated lipase.  Has not been elevated in the past.  States he did feel previous pain has not had any recently.  Can follow-up with PCP.  Will discharge home.          Final Clinical Impression(s) / ED Diagnoses Final diagnoses:  Acute on chronic congestive heart failure, unspecified heart failure type ([place])  Elevated lipase    Rx / DC Orders ED Discharge  Orders     None         Davonna Belling, MD 10/21/22 2347

## 2022-10-21 NOTE — ED Triage Notes (Signed)
POV from home, A&O x 4, GCS 15, BIB wheelchair.  Pt sts that last night he vomited x 2. C/o generalized abd pain and nausea currently. Many chronic medical conditions and was out of some medicines but sts that he has been taking his insulin and BGL are good at home (135 last time he checked today)

## 2022-10-21 NOTE — Discharge Instructions (Signed)
Refill your medicines.  Your lipase was elevated.  Follow-up with your doctor and watch for left-sided abdominal pain.

## 2022-10-23 ENCOUNTER — Telehealth: Payer: Self-pay | Admitting: Family Medicine

## 2022-10-23 ENCOUNTER — Other Ambulatory Visit: Payer: Self-pay

## 2022-10-23 ENCOUNTER — Observation Stay (HOSPITAL_COMMUNITY)
Admission: EM | Admit: 2022-10-23 | Discharge: 2022-10-27 | Disposition: A | Discharge status: Home / Self Care | Payer: No Typology Code available for payment source | Attending: Internal Medicine | Admitting: Internal Medicine

## 2022-10-23 ENCOUNTER — Emergency Department (HOSPITAL_COMMUNITY): Payer: No Typology Code available for payment source

## 2022-10-23 ENCOUNTER — Encounter (HOSPITAL_COMMUNITY): Payer: Self-pay

## 2022-10-23 DIAGNOSIS — Z7982 Long term (current) use of aspirin: Secondary | ICD-10-CM | POA: Insufficient documentation

## 2022-10-23 DIAGNOSIS — Z79899 Other long term (current) drug therapy: Secondary | ICD-10-CM | POA: Insufficient documentation

## 2022-10-23 DIAGNOSIS — I5023 Acute on chronic systolic (congestive) heart failure: Secondary | ICD-10-CM | POA: Insufficient documentation

## 2022-10-23 DIAGNOSIS — N390 Urinary tract infection, site not specified: Secondary | ICD-10-CM | POA: Insufficient documentation

## 2022-10-23 DIAGNOSIS — N179 Acute kidney failure, unspecified: Principal | ICD-10-CM | POA: Diagnosis present

## 2022-10-23 DIAGNOSIS — Z6841 Body Mass Index (BMI) 40.0 and over, adult: Secondary | ICD-10-CM | POA: Insufficient documentation

## 2022-10-23 DIAGNOSIS — G4733 Obstructive sleep apnea (adult) (pediatric): Secondary | ICD-10-CM | POA: Diagnosis present

## 2022-10-23 DIAGNOSIS — I5022 Chronic systolic (congestive) heart failure: Secondary | ICD-10-CM | POA: Diagnosis present

## 2022-10-23 DIAGNOSIS — E66813 Obesity, class 3: Secondary | ICD-10-CM | POA: Diagnosis present

## 2022-10-23 DIAGNOSIS — T402X1A Poisoning by other opioids, accidental (unintentional), initial encounter: Secondary | ICD-10-CM

## 2022-10-23 DIAGNOSIS — N183 Chronic kidney disease, stage 3 unspecified: Secondary | ICD-10-CM | POA: Diagnosis present

## 2022-10-23 DIAGNOSIS — I10 Essential (primary) hypertension: Secondary | ICD-10-CM

## 2022-10-23 DIAGNOSIS — I2510 Atherosclerotic heart disease of native coronary artery without angina pectoris: Secondary | ICD-10-CM | POA: Diagnosis present

## 2022-10-23 LAB — LIPASE, BLOOD: Lipase: 36 U/L (ref 11–51)

## 2022-10-23 LAB — COMPREHENSIVE METABOLIC PANEL
ALT: 12 U/L (ref 0–44)
AST: 23 U/L (ref 15–41)
Albumin: 3.6 g/dL (ref 3.5–5.0)
Alkaline Phosphatase: 64 U/L (ref 38–126)
Anion gap: 12 (ref 5–15)
BUN: 43 mg/dL — ABNORMAL HIGH (ref 6–20)
CO2: 23 mmol/L (ref 22–32)
Calcium: 8.8 mg/dL — ABNORMAL LOW (ref 8.9–10.3)
Chloride: 102 mmol/L (ref 98–111)
Creatinine, Ser: 2.46 mg/dL — ABNORMAL HIGH (ref 0.61–1.24)
GFR, Estimated: 30 mL/min — ABNORMAL LOW (ref >60)
Glucose, Bld: 306 mg/dL — ABNORMAL HIGH (ref 70–99)
Potassium: 4.5 mmol/L (ref 3.5–5.1)
Sodium: 137 mmol/L (ref 135–145)
Total Bilirubin: 0.5 mg/dL (ref 0.3–1.2)
Total Protein: 7.2 g/dL (ref 6.5–8.1)

## 2022-10-23 LAB — CBC WITH DIFFERENTIAL/PLATELET
Abs Immature Granulocytes: 0.04 10*3/uL (ref 0.00–0.07)
Basophils Absolute: 0.1 10*3/uL (ref 0.0–0.1)
Basophils Relative: 1 %
Eosinophils Absolute: 0.1 10*3/uL (ref 0.0–0.5)
Eosinophils Relative: 1 %
HCT: 41.9 % (ref 39.0–52.0)
Hemoglobin: 13.6 g/dL (ref 13.0–17.0)
Immature Granulocytes: 0 %
Lymphocytes Relative: 16 %
Lymphs Abs: 1.7 10*3/uL (ref 0.7–4.0)
MCH: 29.8 pg (ref 26.0–34.0)
MCHC: 32.5 g/dL (ref 30.0–36.0)
MCV: 91.7 fL (ref 80.0–100.0)
Monocytes Absolute: 0.6 10*3/uL (ref 0.1–1.0)
Monocytes Relative: 6 %
Neutro Abs: 8.3 10*3/uL — ABNORMAL HIGH (ref 1.7–7.7)
Neutrophils Relative %: 76 %
Platelets: 148 10*3/uL — ABNORMAL LOW (ref 150–400)
RBC: 4.57 MIL/uL (ref 4.22–5.81)
RDW: 12.2 % (ref 11.5–15.5)
WBC: 10.9 10*3/uL — ABNORMAL HIGH (ref 4.0–10.5)
nRBC: 0 % (ref 0.0–0.2)

## 2022-10-23 LAB — CBG MONITORING, ED
Glucose-Capillary: 290 mg/dL — ABNORMAL HIGH (ref 70–99)
Glucose-Capillary: 219 mg/dL — ABNORMAL HIGH (ref 70–99)
Glucose-Capillary: 410 mg/dL — ABNORMAL HIGH (ref 70–99)

## 2022-10-23 LAB — URINALYSIS, ROUTINE W REFLEX MICROSCOPIC
Bilirubin Urine: NEGATIVE
Glucose, UA: NEGATIVE mg/dL
Ketones, ur: NEGATIVE mg/dL
Nitrite: NEGATIVE
Protein, ur: NEGATIVE mg/dL
Specific Gravity, Urine: 1.008 (ref 1.005–1.030)
WBC, UA: >50 WBC/hpf (ref 0–5)
pH: 5 (ref 5.0–8.0)

## 2022-10-23 LAB — LACTIC ACID, PLASMA: Lactic Acid, Venous: 1.1 mmol/L (ref 0.5–1.9)

## 2022-10-23 LAB — BRAIN NATRIURETIC PEPTIDE: B Natriuretic Peptide: 48.8 pg/mL (ref 0.0–100.0)

## 2022-10-23 LAB — URIC ACID: Uric Acid, Serum: 10.9 mg/dL — ABNORMAL HIGH (ref 3.7–8.6)

## 2022-10-23 LAB — HIV ANTIBODY (ROUTINE TESTING W REFLEX): HIV Screen 4th Generation wRfx: NONREACTIVE

## 2022-10-23 LAB — CK: Total CK: 410 U/L — ABNORMAL HIGH (ref 49–397)

## 2022-10-23 MED ORDER — OXYCODONE HCL 5 MG PO TABS: 2.5000 mg | ORAL_TABLET | Freq: Four times a day (QID) | ORAL | Status: DC | PRN

## 2022-10-23 MED ORDER — HYDROMORPHONE HCL 1 MG/ML IJ SOLN
0.5000 mg | Freq: Once | INTRAMUSCULAR | Status: AC
  Administered 2022-10-23: 0.5 mg via INTRAVENOUS
  Filled 2022-10-23: qty 1

## 2022-10-23 MED ORDER — SODIUM CHLORIDE 0.9 % IV SOLN
1.0000 g | Freq: Once | INTRAVENOUS | Status: AC
  Administered 2022-10-23: 1 g via INTRAVENOUS
  Filled 2022-10-23: qty 10

## 2022-10-23 MED ORDER — INSULIN ASPART 100 UNIT/ML IJ SOLN
0.0000 [IU] | Freq: Three times a day (TID) | INTRAMUSCULAR | Status: DC
  Administered 2022-10-24: 3 [IU] via SUBCUTANEOUS
  Administered 2022-10-24: 2 [IU] via SUBCUTANEOUS

## 2022-10-23 MED ORDER — BASAGLAR KWIKPEN 100 UNIT/ML ~~LOC~~ SOPN: 42.0000 [IU] | PEN_INJECTOR | Freq: Every day | SUBCUTANEOUS | Status: DC

## 2022-10-23 MED ORDER — INSULIN GLARGINE-YFGN 100 UNIT/ML ~~LOC~~ SOLN
42.0000 [IU] | Freq: Every day | SUBCUTANEOUS | Status: DC
  Administered 2022-10-23 – 2022-10-26 (×4): 42 [IU] via SUBCUTANEOUS
  Filled 2022-10-23 (×5): qty 0.42

## 2022-10-23 MED ORDER — INSULIN ASPART 100 UNIT/ML IJ SOLN
0.0000 [IU] | Freq: Every day | INTRAMUSCULAR | Status: DC
  Administered 2022-10-23: 5 [IU] via SUBCUTANEOUS
  Administered 2022-10-24 – 2022-10-25 (×2): 2 [IU] via SUBCUTANEOUS

## 2022-10-23 MED ORDER — SODIUM CHLORIDE 0.9 % IV BOLUS: 500.0000 mL | Freq: Once | INTRAVENOUS | Status: DC

## 2022-10-23 MED ORDER — GABAPENTIN 100 MG PO CAPS
100.0000 mg | ORAL_CAPSULE | Freq: Two times a day (BID) | ORAL | Status: DC
  Administered 2022-10-23 – 2022-10-26 (×7): 100 mg via ORAL
  Filled 2022-10-23 (×8): qty 1

## 2022-10-23 MED ORDER — LINAGLIPTIN 5 MG PO TABS
5.0000 mg | ORAL_TABLET | Freq: Every day | ORAL | Status: DC
  Administered 2022-10-24 – 2022-10-26 (×3): 5 mg via ORAL
  Filled 2022-10-23 (×4): qty 1

## 2022-10-23 MED ORDER — CLOPIDOGREL BISULFATE 75 MG PO TABS
75.0000 mg | ORAL_TABLET | Freq: Every day | ORAL | Status: DC
  Administered 2022-10-24 – 2022-10-26 (×3): 75 mg via ORAL
  Filled 2022-10-23 (×4): qty 1

## 2022-10-23 MED ORDER — PANTOPRAZOLE SODIUM 40 MG PO TBEC: 40.0000 mg | DELAYED_RELEASE_TABLET | Freq: Every day | ORAL | Status: DC | PRN

## 2022-10-23 MED ORDER — NALOXONE HCL 2 MG/2ML IJ SOSY
PREFILLED_SYRINGE | INTRAMUSCULAR | Status: AC
  Administered 2022-10-23: 2 mg
  Filled 2022-10-23: qty 2

## 2022-10-23 MED ORDER — SODIUM CHLORIDE 0.9 % IV SOLN
1.0000 g | INTRAVENOUS | Status: AC
  Administered 2022-10-23: 1 g via INTRAVENOUS
  Filled 2022-10-23: qty 10

## 2022-10-23 MED ORDER — ACETAMINOPHEN-CODEINE 300-30 MG PO TABS: 1.0000 | ORAL_TABLET | Freq: Four times a day (QID) | ORAL | Status: DC | PRN

## 2022-10-23 MED ORDER — SODIUM CHLORIDE 0.9 % IV SOLN: 1.0000 g | INTRAVENOUS | Status: DC

## 2022-10-23 MED ORDER — NALOXONE HCL 4 MG/0.1ML NA LIQD: 1.0000 | Freq: Once | NASAL | Status: DC

## 2022-10-23 MED ORDER — SODIUM CHLORIDE 0.9 % IV SOLN
2.0000 g | INTRAVENOUS | Status: DC
  Administered 2022-10-24 – 2022-10-27 (×4): 2 g via INTRAVENOUS
  Filled 2022-10-23 (×4): qty 20

## 2022-10-23 MED ORDER — AMITRIPTYLINE HCL 10 MG PO TABS
10.0000 mg | ORAL_TABLET | Freq: Every day | ORAL | Status: DC
  Administered 2022-10-23 – 2022-10-26 (×4): 10 mg via ORAL
  Filled 2022-10-23 (×5): qty 1

## 2022-10-23 MED ORDER — HEPARIN SODIUM (PORCINE) 5000 UNIT/ML IJ SOLN
5000.0000 [IU] | Freq: Two times a day (BID) | INTRAMUSCULAR | Status: DC
  Administered 2022-10-23 – 2022-10-26 (×7): 5000 [IU] via SUBCUTANEOUS
  Filled 2022-10-23 (×8): qty 1

## 2022-10-23 MED ORDER — SODIUM CHLORIDE 0.9 % IV BOLUS
500.0000 mL | Freq: Once | INTRAVENOUS | Status: AC
  Administered 2022-10-23: 500 mL via INTRAVENOUS

## 2022-10-23 MED ORDER — SODIUM CHLORIDE 0.9 % IV SOLN: INTRAVENOUS | Status: AC

## 2022-10-23 MED ORDER — HYDROMORPHONE HCL 1 MG/ML IJ SOLN: 0.5000 mg | INTRAMUSCULAR | Status: DC | PRN

## 2022-10-23 MED ORDER — TAMSULOSIN HCL 0.4 MG PO CAPS
0.4000 mg | ORAL_CAPSULE | Freq: Every day | ORAL | Status: DC
  Administered 2022-10-24 – 2022-10-26 (×3): 0.4 mg via ORAL
  Filled 2022-10-23 (×3): qty 1

## 2022-10-23 MED ORDER — HYDROMORPHONE HCL 1 MG/ML IJ SOLN
1.0000 mg | Freq: Once | INTRAMUSCULAR | Status: AC
  Administered 2022-10-23: 1 mg via INTRAVENOUS
  Filled 2022-10-23: qty 1

## 2022-10-23 MED ORDER — METOCLOPRAMIDE HCL 5 MG/ML IJ SOLN
10.0000 mg | Freq: Once | INTRAMUSCULAR | Status: AC
  Administered 2022-10-23: 10 mg via INTRAVENOUS
  Filled 2022-10-23: qty 2

## 2022-10-23 MED ORDER — ASPIRIN 81 MG PO TBEC
81.0000 mg | DELAYED_RELEASE_TABLET | Freq: Every day | ORAL | Status: DC
  Administered 2022-10-24 – 2022-10-26 (×3): 81 mg via ORAL
  Filled 2022-10-23 (×4): qty 1

## 2022-10-23 MED ORDER — SODIUM CHLORIDE 0.9 % IV BOLUS
1000.0000 mL | Freq: Once | INTRAVENOUS | Status: AC
  Administered 2022-10-23: 1000 mL via INTRAVENOUS

## 2022-10-23 MED ORDER — CARVEDILOL 6.25 MG PO TABS
6.2500 mg | ORAL_TABLET | Freq: Two times a day (BID) | ORAL | Status: DC
  Administered 2022-10-24 – 2022-10-26 (×6): 6.25 mg via ORAL
  Filled 2022-10-23 (×7): qty 1

## 2022-10-23 MED ORDER — METHOCARBAMOL 500 MG PO TABS
500.0000 mg | ORAL_TABLET | Freq: Four times a day (QID) | ORAL | Status: DC | PRN
  Administered 2022-10-25: 500 mg via ORAL
  Filled 2022-10-23: qty 1

## 2022-10-23 MED ORDER — ROSUVASTATIN CALCIUM 20 MG PO TABS
40.0000 mg | ORAL_TABLET | Freq: Every day | ORAL | Status: DC
  Administered 2022-10-24 – 2022-10-26 (×3): 40 mg via ORAL
  Filled 2022-10-23 (×4): qty 2

## 2022-10-23 NOTE — ED Notes (Signed)
ED TO INPATIENT HANDOFF REPORT  ED Nurse Name and Phone #: Newman Pies [phone]  S Name/Age/Gender Mitchell [name] 52 y.o. male Room/Bed: 038C/038C  Code Status   Code Status: Full Code  Home/SNF/Other Home Patient oriented to: self, place, time, and situation Is this baseline? Yes   Triage Complete: Triage complete  Chief Complaint AKI (acute kidney injury) ([place]) [N17.9]  Triage Note Pt to ED via EMS from home with c/o abd pain onset 3-4 days ago. Pt was seen for issue and was told his lipase was elevated. Pt states the pain is getting worse and he has SOB. Pt Aox4 in triage. Pt reports N/V.   Allergies Allergies  Allergen Reactions   Farxiga [Dapagliflozin] Other (See Comments)    Patient reports loss of consciousness   Nsaids Anaphylaxis and Rash    Able to tolerate aspirin    Trulicity [Dulaglutide] Nausea And Vomiting    Level of Care/Admitting Diagnosis ED Disposition     ED Disposition  Admit   Condition  --   [place]: [place] [[id number]]  Level of Care: Telemetry Medical [104]  May place patient in observation at [facility] or [place] if equivalent level of care is available:: No  Covid Evaluation: Asymptomatic - no recent exposure (last 10 days) testing not required  Diagnosis: AKI (acute kidney injury) [facility]) [id number]  Admitting Physician: Lequita Halt [id number]  Attending Physician: Lequita Halt [id number]          B Medical/Surgery History Past Medical History:  Diagnosis Date   Bipolar disorder ([place])    CAD (coronary artery disease)    a. diffuse 3v CAD by cath 2019, medical therapy recommended.   Cataract    forming    Chronic systolic CHF (congestive heart failure) (HCC)    CVA (cerebral infarction)    No residual deficits   Depression    PTSD,    Diabetes mellitus    TYPE II; UNCONTROLLED BY HEMOGLOBIN A1c; STABLE AS  PER DISCHARGE   Headache(784.0)    Herpes simplex of male genitalia     History of colonic polyps    Hyperlipidemia    Hypertension    Myocardial infarction ([place]) 1987   (while playing football)   Obesity    OSA (obstructive sleep apnea)    repeat study 2018 without significant OSA   Pneumonia    Post-cardiac injury syndrome ([place])    History of cardiac injury from blunt trauma   Pulmonary hypertension ([place])    a. moderately elevated PASP 07/2019.   PVCs (premature ventricular contractions)    Schizophrenia ([place])    Goes to [facility]   Sleep apnea    Stroke [facility]) 2005   some left side weakness   Syncope    Recurrent, thought to be vasovagal. Also has h/o frequent PVCs.    Past Surgical History:  Procedure Laterality Date   AMPUTATION Right 01/25/2021   Procedure: RIGHT BELOW KNEE AMPUTATION;  Surgeon: [name], MD;  Location: [place];  Service: Orthopedics;  Laterality: Right;   CARDIAC CATHETERIZATION  12/19/10   DIFFUSE NONOBSTRUCTIVE CAD; NONISCHEMIC CARDIOMYOPATHY; LEFT VENTRICULAR ANGIOGRAM WAS PERFORMED SECONDARY TO  ELEVATED LEFT VENTRICULAR FILLING PRESSURES   COLONOSCOPY     ~ age [age]   COLONOSCOPY W/ POLYPECTOMY     I & D EXTREMITY Bilateral 10/24/2019   Procedure: IRRIGATION AND DEBRIDEMENT BILATERAL EXTREMITY WOUND ON FOOT;  Surgeon: Cindra Presume, MD;  Location: [place];  Service: Plastics;  Laterality: Bilateral;   ICD IMPLANT N/A 01/13/2022   Procedure: ICD IMPLANT;  Surgeon: Evans Lance, MD;  Location: [place] [facility];  Service: Cardiovascular;  Laterality: N/A;   METATARSAL HEAD EXCISION Right 08/29/2018   Procedure: METATARSAL HEAD RESECTION;  Surgeon: Edrick Kins, DPM;  Location: [place];  Service: Podiatry;  Laterality: Right;   METATARSAL OSTEOTOMY Right 08/29/2018   Procedure: SUB FIFTHE METATARSIA RIGHT FOOT;  Surgeon: Edrick Kins, DPM;  Location: [place];  Service: Podiatry;  Laterality: Right;   MULTIPLE EXTRACTIONS WITH ALVEOLOPLASTY  01/27/2014   "all my teeth; 4 Quadrants of alveoloplasty    MULTIPLE EXTRACTIONS WITH ALVEOLOPLASTY N/A 01/27/2014   Procedure: EXTRACTION OF TEETH #'1, 2, 3, 4, 5, 6, 7, 8, 9, 10, 11, 12, 13, 14, 15, 16, 17, 20, 21, 22, 23, 24, 25, 26, 27, 28, 29, 31 and 32 WITH ALVEOLOPLASTY;  Surgeon: Lenn Cal, DDS;  Location: [place];  Service: Oral Surgery;  Laterality: N/A;   ORIF FINGER / THUMB FRACTURE Right    POLYPECTOMY     RIGHT HEART CATH N/A 01/27/2021   Procedure: RIGHT HEART CATH;  Surgeon: Jolaine Artist, MD;  Location: [place] [facility];  Service: Cardiovascular;  Laterality: N/A;   RIGHT/LEFT HEART CATH AND CORONARY ANGIOGRAPHY N/A 09/26/2017   Procedure: RIGHT/LEFT HEART CATH AND CORONARY ANGIOGRAPHY;  Surgeon: Jolaine Artist, MD;  Location: [place] [facility];  Service: Cardiovascular;  Laterality: N/A;   RIGHT/LEFT HEART CATH AND CORONARY ANGIOGRAPHY N/A 04/12/2021   Procedure: RIGHT/LEFT HEART CATH AND CORONARY ANGIOGRAPHY;  Surgeon: Jolaine Artist, MD;  Location: [place] [facility];  Service: Cardiovascular;  Laterality: N/A;   SKIN SPLIT GRAFT Bilateral 10/24/2019   Procedure: SKIN GRAFT SPLIT THICKNESS LEFT THIGH;  Surgeon: Cindra Presume, MD;  Location: [place];  Service: Plastics;  Laterality: Bilateral;   WOUND DEBRIDEMENT Right 08/29/2018   Procedure: Debridement of ulcer on right fifth metatarsal;  Surgeon: Edrick Kins, DPM;  Location: [place];  Service: Podiatry;  Laterality: Right;     A IV Location/Drains/Wounds Patient Lines/Drains/Airways Status     Active Line/Drains/Airways     Name Placement date Placement time Site Days   Peripheral IV 10/23/22 20 G Anterior;Right Forearm 10/23/22  1050  Forearm  less than 1            Intake/Output Last 24 hours  Intake/Output Summary (Last 24 hours) at 10/23/2022 2216 Last data filed at 10/23/2022 2038 Gross per 24 hour  Intake 100 ml  Output --  Net 100 ml    Labs/Imaging Results for orders placed or performed during the hospital encounter of 10/23/22 (from  the past 48 hour(s))  Brain natriuretic peptide     Status: None   Collection Time: 10/23/22 10:02 AM  Result Value Ref Range   B Natriuretic Peptide 48.8 0.0 - 100.0 pg/mL    Comment: Performed at [facility], [place] [address] [place], [place] [address]  CBC with Differential     Status: Abnormal   Collection Time: 10/23/22 10:20 AM  Result Value Ref Range   WBC 10.9 (H) 4.0 - 10.5 K/uL   RBC 4.57 4.22 - 5.81 MIL/uL   Hemoglobin 13.6 13.0 - 17.0 g/dL   HCT 41.9 39.0 - 52.0 %   MCV 91.7 80.0 - 100.0 fL   MCH 29.8 26.0 - 34.0 pg   MCHC 32.5 30.0 - 36.0  g/dL   RDW 12.2 11.5 - 15.5 %   Platelets 148 (L) 150 - 400 K/uL    Comment: REPEATED TO VERIFY   nRBC 0.0 0.0 - 0.2 %   Neutrophils Relative % 76 %   Neutro Abs 8.3 (H) 1.7 - 7.7 K/uL   Lymphocytes Relative 16 %   Lymphs Abs 1.7 0.7 - 4.0 K/uL   Monocytes Relative 6 %   Monocytes Absolute 0.6 0.1 - 1.0 K/uL   Eosinophils Relative 1 %   Eosinophils Absolute 0.1 0.0 - 0.5 K/uL   Basophils Relative 1 %   Basophils Absolute 0.1 0.0 - 0.1 K/uL   Immature Granulocytes 0 %   Abs Immature Granulocytes 0.04 0.00 - 0.07 K/uL    Comment: Performed at [facility] [address] [place], [place] [address]  Comprehensive metabolic panel     Status: Abnormal   Collection Time: 10/23/22 10:20 AM  Result Value Ref Range   Sodium 137 135 - 145 mmol/L   Potassium 4.5 3.5 - 5.1 mmol/L   Chloride 102 98 - 111 mmol/L   CO2 23 22 - 32 mmol/L   Glucose, Bld 306 (H) 70 - 99 mg/dL    Comment: Glucose reference range applies only to samples taken after fasting for at least 8 hours.   BUN 43 (H) 6 - 20 mg/dL   Creatinine, Ser 2.46 (H) 0.61 - 1.24 mg/dL   Calcium 8.8 (L) 8.9 - 10.3 mg/dL   Total Protein 7.2 6.5 - 8.1 g/dL   Albumin 3.6 3.5 - 5.0 g/dL   AST 23 15 - 41 U/L   ALT 12 0 - 44 U/L   Alkaline Phosphatase 64 38 - 126 U/L   Total Bilirubin 0.5 0.3 - 1.2 mg/dL   GFR, Estimated 30 (L) >60 mL/min    Comment: (NOTE) Calculated  using the CKD-EPI Creatinine Equation (2021)    Anion gap 12 5 - 15    Comment: Performed at [facility] [address], [place], [place] [address]  Lipase, blood     Status: None   Collection Time: 10/23/22 10:20 AM  Result Value Ref Range   Lipase 36 11 - 51 U/L    Comment: Performed at [facility] [address], [place], [place] [address]  POC CBG, ED     Status: Abnormal   Collection Time: 10/23/22 11:15 AM  Result Value Ref Range   Glucose-Capillary 290 (H) 70 - 99 mg/dL    Comment: Glucose reference range applies only to samples taken after fasting for at least 8 hours.  Urinalysis, Routine w reflex microscopic -Urine, Clean Catch     Status: Abnormal   Collection Time: 10/23/22  1:30 PM  Result Value Ref Range   Color, Urine YELLOW YELLOW   APPearance CLOUDY (A) CLEAR   Specific Gravity, Urine 1.008 1.005 - 1.030   pH 5.0 5.0 - 8.0   Glucose, UA NEGATIVE NEGATIVE mg/dL   Hgb urine dipstick SMALL (A) NEGATIVE   Bilirubin Urine NEGATIVE NEGATIVE   Ketones, ur NEGATIVE NEGATIVE mg/dL   Protein, ur NEGATIVE NEGATIVE mg/dL   Nitrite NEGATIVE NEGATIVE   Leukocytes,Ua LARGE (A) NEGATIVE   RBC / HPF 11-20 0 - 5 RBC/hpf   WBC, UA >50 0 - 5 WBC/hpf   Bacteria, UA RARE (A) NONE SEEN   Squamous Epithelial / HPF 0-5 0 - 5 /HPF   WBC Clumps PRESENT    Mucus PRESENT  Hyaline Casts, UA PRESENT     Comment: Performed at [facility], [place] [address], [place], [place] [address]  CBG monitoring, ED     Status: Abnormal   Collection Time: 10/23/22  5:04 PM  Result Value Ref Range   Glucose-Capillary 219 (H) 70 - 99 mg/dL    Comment: Glucose reference range applies only to samples taken after fasting for at least 8 hours.  Lactic acid, plasma     Status: None   Collection Time: 10/23/22  8:32 PM  Result Value Ref Range   Lactic Acid, Venous 1.1 0.5 - 1.9 mmol/L    Comment: Performed at [facility], [place] [address] [place], [place] [address]  HIV Antibody  (routine testing w rflx)     Status: None   Collection Time: 10/23/22  8:32 PM  Result Value Ref Range   HIV Screen 4th Generation wRfx Non Reactive Non Reactive    Comment: Performed at [facility], [place] [address] [place], [place] [address]  Uric acid     Status: Abnormal   Collection Time: 10/23/22  8:32 PM  Result Value Ref Range   Uric Acid, Serum 10.9 (H) 3.7 - 8.6 mg/dL    Comment: HEMOLYSIS AT THIS LEVEL MAY AFFECT RESULT Performed at [facility], [place] [address], [place], [place] [address]   CK     Status: Abnormal   Collection Time: 10/23/22  8:32 PM  Result Value Ref Range   Total CK 410 (H) 49 - 397 U/L    Comment: Performed at [facility], [place] [address] [place], [place] [address]  CBG monitoring, ED     Status: Abnormal   Collection Time: 10/23/22 10:02 PM  Result Value Ref Range   Glucose-Capillary 410 (H) 70 - 99 mg/dL    Comment: Glucose reference range applies only to samples taken after fasting for at least 8 hours.   CT ABDOMEN PELVIS WO CONTRAST  Result Date: 10/23/2022 CLINICAL DATA:  Severe pancreatitis EXAM: CT ABDOMEN AND PELVIS WITHOUT CONTRAST TECHNIQUE: Multidetector CT imaging of the abdomen and pelvis was performed following the standard protocol without IV contrast. RADIATION DOSE REDUCTION: This exam was performed according to the departmental dose-optimization program which includes automated exposure control, adjustment of the mA and/or kV according to patient size and/or use of iterative reconstruction technique. COMPARISON:  Renal stone CT 03/15/2022 and older FINDINGS: Lower chest: There is some breathing motion at the lung bases. Mild basilar atelectasis. No effusion. The heart is enlarged. Pacemaker leads along the right side of the heart. Slightly patulous thoracic esophagus. Hepatobiliary: On this non IV contrast exam, the liver is grossly preserved. Pancreas: Pancreas is nonenlarged. No significant peripancreatic fat stranding  fluid collection. No CT sequela pancreatitis on this noncontrast exam. Sensitivity can be increased with a contrast exam as clinically appropriate. Spleen: Normal in size without focal abnormality. Adrenals/Urinary Tract: Stable mild thickening of the right adrenal gland. Left adrenal gland is preserved. No abnormal calcification seen within either kidney nor along the course of either ureter. Preserved contours of the urinary bladder. Stomach/Bowel: The large bowel has a normal course and caliber with scattered stool. Mild stool burden. Normal appendix extends medial and inferior to the cecum in the right hemipelvis. There is mild debris in the stomach. Small bowel is nondilated. Vascular/Lymphatic: Mild vascular calcifications along the aorta and branch vessels. Normal caliber aorta and IVC. No specific abnormal lymph node enlargement present in the  abdomen and pelvis. Reproductive: Prostate is unremarkable. Other: Small fat containing right inguinal hernia. Musculoskeletal: Mild degenerative changes along the spine. IMPRESSION: No obstructing renal stone.  Mild stool.  Normal appendix. No sequela of pancreatitis seen on this noncontrast CT examination. Electronically Signed   By: Jill Side M.D.   On: 10/23/2022 12:45   DG Chest Portable 1 View  Result Date: 10/23/2022 CLINICAL DATA:  Shortness of EXAM: PORTABLE CHEST 1 VIEW COMPARISON:  Radiograph 10/21/2022 FINDINGS: Unchanged enlarged cardiac silhouette with single lead AICD. Faint opacity overlying the region of the lingula. No large effusion or evidence of pneumothorax. No acute osseous abnormality. Thoracic spondylosis. IMPRESSION: Faint opacity in the region of the lingula, could be atelectasis or developing infection. Unchanged cardiomegaly.  No overt pulmonary edema or large effusion. Electronically Signed   By: Maurine Simmering M.D.   On: 10/23/2022 10:23    Pending Labs Unresulted Labs (From admission, onward)     Start     Ordered   10/24/22  [id number]  Basic metabolic panel  Tomorrow morning,   R        10/23/22 1818   10/24/22 0500  CBC  Tomorrow morning,   R        10/23/22 1818   10/23/22 1723  Blood culture (routine x 2)  BLOOD CULTURE X 2,   R (with STAT occurrences)     Question Answer Comment  Patient immune status Normal   Release to patient Immediate      10/23/22 1722   10/23/22 1723  Lactic acid, plasma  Now then every 2 hours,   R (with STAT occurrences)      10/23/22 1722   10/23/22 1417  Urine Culture  Once,   URGENT       Question Answer Comment  Indication Dysuria   Patient immune status Normal   Release to patient Immediate      10/23/22 1416            Vitals/Pain Today's Vitals   10/23/22 1900 10/23/22 1910 10/23/22 1950 10/23/22 2117  BP: (!) 89/60 106/66  106/83  Pulse: 88 94  98  Resp: 13 16  15   Temp:    97.9 F (36.6 C)  TempSrc:    Oral  SpO2: 100% 100%  100%  Weight:      Height:      PainSc:   8  4     Isolation Precautions No active isolations  Medications Medications  acetaminophen-codeine (TYLENOL #3) 300-30 MG per tablet 1 tablet (has no administration in time range)  aspirin EC tablet 81 mg (has no administration in time range)  oxyCODONE (Oxy IR/ROXICODONE) immediate release tablet 2.5-5 mg (has no administration in time range)  carvedilol (COREG) tablet 6.25 mg (has no administration in time range)  rosuvastatin (CRESTOR) tablet 40 mg (has no administration in time range)  amitriptyline (ELAVIL) tablet 10 mg (10 mg Oral Given 10/23/22 2211)  pantoprazole (PROTONIX) EC tablet 40 mg (has no administration in time range)  clopidogrel (PLAVIX) tablet 75 mg (has no administration in time range)  gabapentin (NEURONTIN) capsule 100 mg (100 mg Oral Given 10/23/22 2211)  methocarbamol (ROBAXIN) tablet 500 mg (has no administration in time range)  heparin injection 5,000 Units (5,000 Units Subcutaneous Given 10/23/22 2212)  0.9 %  sodium chloride infusion ( Intravenous New  Bag/Given 10/23/22 2116)  HYDROmorphone (DILAUDID) injection 0.5-1 mg (has no administration in time range)  insulin aspart (novoLOG) injection 0-15 Units (has no  administration in time range)  insulin aspart (novoLOG) injection 0-5 Units (5 Units Subcutaneous Given 10/23/22 2212)  linagliptin (TRADJENTA) tablet 5 mg (has no administration in time range)  insulin glargine-yfgn (SEMGLEE) injection 42 Units (42 Units Subcutaneous Given 10/23/22 2212)  cefTRIAXone (ROCEPHIN) 2 g in sodium chloride 0.9 % 100 mL IVPB (has no administration in time range)  tamsulosin (FLOMAX) capsule 0.4 mg (has no administration in time range)  sodium chloride 0.9 % bolus 500 mL (0 mLs Intravenous Stopped 10/23/22 1300)  HYDROmorphone (DILAUDID) injection 0.5 mg (0.5 mg Intravenous Given 10/23/22 1400)  cefTRIAXone (ROCEPHIN) 1 g in sodium chloride 0.9 % 100 mL IVPB (0 g Intravenous Stopped 10/23/22 1649)  HYDROmorphone (DILAUDID) injection 1 mg (1 mg Intravenous Given 10/23/22 1555)  metoCLOPramide (REGLAN) injection 10 mg (10 mg Intravenous Given 10/23/22 1649)  sodium chloride 0.9 % bolus 1,000 mL (0 mLs Intravenous Stopped 10/23/22 1943)  naloxone (NARCAN) 2 MG/2ML injection (2 mg  Given 10/23/22 1658)  cefTRIAXone (ROCEPHIN) 1 g in sodium chloride 0.9 % 100 mL IVPB (0 g Intravenous Stopped 10/23/22 2038)    Mobility walks with device     Focused Assessments Hyperglycemic with abdominal pain   R Recommendations: See Admitting Provider Note  Report given to:   Additional Notes: CBG at 2200 was 410; per Dr. Velia Meyer, recheck one hour after insulin and notify MD if glucose remains greater than 400. Pt is a/o x 4, continent, IV in place. Rt BKA prosthesis at bedside.

## 2022-10-23 NOTE — H&P (Signed)
History and Physical    Mitchell [name] [id number] DOB: January 08, 1971 DOA: 10/23/2022  PCP: [name], MD (Confirm with patient/family/NH records and if not entered, this has to be entered at [facility] point of entry) Patient coming from: Home  I have personally briefly reviewed patient's old medical records in [place]  Chief Complaint: Left flank pain, burning pain when urinate, nausea vomiting, blood in the urine  HPI: Mitchell [name] is a 52 y.o. male with medical history significant of chronic HFrEF with LVEF 20-25%, ischemic and nonischemic cardiomyopathy, HTN, CKD stage II, IDDM, diabetic neuropathy, OSA on CPAP, morbid obesity, presented with persistent left flank pain feeling nausea, and dysuria and hematuria today.  Patient has had intermittent left flank pain for 4 to 69-month, cramping-like, intermittent, but denied any dysuria diarrhea nausea vomiting, most episode subsided based on.  This time, patient started to have same left flank cramping like abdominal pain on Friday 10/10, associated with nausea and he went to see ED physician.  Workup showed questionable UTI and elevated lipase 174, patient was reassured and sent home to see his PCP.  Over the last 2 days, patient continued to experience more frequent episode of left sided flank pain initially was nonradiating but this morning, patient started to have left flank pain radiating to penis associated with new hematuria, with worsening of left flank pain.  No fever or chills.  No history of BPH however patient does admitted that he has been having weak stream and intermittent cutoff of urine stream.  ED Course: Afebrile, none tachycardia none hypotension.  However patient complained about 10/10 pain in the ED and he was given 2 doses of morphine and became hypotensive and unresponsive and 1 dose of Narcan And Patient woke up.  Blood work showed lipase 36 compared to 174 4 days ago, creatinine 2.4 compared to 1.43 days ago,  WBC 10.9.  UA showed gross hematuria.  Patient was started on ceftriaxone.  CT abdominal pelvis showed no acute signs of acute pancreatitis and no significant renal/ureter obstructions.  Review of Systems: As per HPI otherwise 14 point review of systems negative.    Past Medical History:  Diagnosis Date   Bipolar disorder ([place])    CAD (coronary artery disease)    a. diffuse 3v CAD by cath 2019, medical therapy recommended.   Cataract    forming    Chronic systolic CHF (congestive heart failure) (HCC)    CVA (cerebral infarction)    No residual deficits   Depression    PTSD,    Diabetes mellitus    TYPE II; UNCONTROLLED BY HEMOGLOBIN A1c; STABLE AS  PER DISCHARGE   Headache(784.0)    Herpes simplex of male genitalia    History of colonic polyps    Hyperlipidemia    Hypertension    Myocardial infarction ([place]) 1987   (while playing football)   Obesity    OSA (obstructive sleep apnea)    repeat study 2018 without significant OSA   Pneumonia    Post-cardiac injury syndrome ([place])    History of cardiac injury from blunt trauma   Pulmonary hypertension ([place])    a. moderately elevated PASP 07/2019.   PVCs (premature ventricular contractions)    Schizophrenia ([place])    Goes to [facility]   Sleep apnea    Stroke [facility]) 2005   some left side weakness   Syncope    Recurrent, thought to be vasovagal. Also has h/o frequent PVCs.  Past Surgical History:  Procedure Laterality Date   AMPUTATION Right 01/25/2021   Procedure: RIGHT BELOW KNEE AMPUTATION;  Surgeon: [name], MD;  Location: [place];  Service: Orthopedics;  Laterality: Right;   CARDIAC CATHETERIZATION  12/19/10   DIFFUSE NONOBSTRUCTIVE CAD; NONISCHEMIC CARDIOMYOPATHY; LEFT VENTRICULAR ANGIOGRAM WAS PERFORMED SECONDARY TO  ELEVATED LEFT VENTRICULAR FILLING PRESSURES   COLONOSCOPY     ~ age [age]   COLONOSCOPY W/ POLYPECTOMY     I & D EXTREMITY Bilateral 10/24/2019   Procedure: IRRIGATION AND  DEBRIDEMENT BILATERAL EXTREMITY WOUND ON FOOT;  Surgeon: Cindra Presume, MD;  Location: [place];  Service: Plastics;  Laterality: Bilateral;   ICD IMPLANT N/A 01/13/2022   Procedure: ICD IMPLANT;  Surgeon: Evans Lance, MD;  Location: [place] [facility];  Service: Cardiovascular;  Laterality: N/A;   METATARSAL HEAD EXCISION Right 08/29/2018   Procedure: METATARSAL HEAD RESECTION;  Surgeon: Edrick Kins, DPM;  Location: [place];  Service: Podiatry;  Laterality: Right;   METATARSAL OSTEOTOMY Right 08/29/2018   Procedure: SUB FIFTHE METATARSIA RIGHT FOOT;  Surgeon: Edrick Kins, DPM;  Location: [place];  Service: Podiatry;  Laterality: Right;   MULTIPLE EXTRACTIONS WITH ALVEOLOPLASTY  01/27/2014   "all my teeth; 4 Quadrants of alveoloplasty   MULTIPLE EXTRACTIONS WITH ALVEOLOPLASTY N/A 01/27/2014   Procedure: EXTRACTION OF TEETH #'1, 2, 3, 4, 5, 6, 7, 8, 9, 10, 11, 12, 13, 14, 15, 16, 17, 20, 21, 22, 23, 24, 25, 26, 27, 28, 29, 31 and 32 WITH ALVEOLOPLASTY;  Surgeon: Lenn Cal, DDS;  Location: [place];  Service: Oral Surgery;  Laterality: N/A;   ORIF FINGER / THUMB FRACTURE Right    POLYPECTOMY     RIGHT HEART CATH N/A 01/27/2021   Procedure: RIGHT HEART CATH;  Surgeon: Jolaine Artist, MD;  Location: [place] [facility];  Service: Cardiovascular;  Laterality: N/A;   RIGHT/LEFT HEART CATH AND CORONARY ANGIOGRAPHY N/A 09/26/2017   Procedure: RIGHT/LEFT HEART CATH AND CORONARY ANGIOGRAPHY;  Surgeon: Jolaine Artist, MD;  Location: [place] [facility];  Service: Cardiovascular;  Laterality: N/A;   RIGHT/LEFT HEART CATH AND CORONARY ANGIOGRAPHY N/A 04/12/2021   Procedure: RIGHT/LEFT HEART CATH AND CORONARY ANGIOGRAPHY;  Surgeon: Jolaine Artist, MD;  Location: [place] [facility];  Service: Cardiovascular;  Laterality: N/A;   SKIN SPLIT GRAFT Bilateral 10/24/2019   Procedure: SKIN GRAFT SPLIT THICKNESS LEFT THIGH;  Surgeon: Cindra Presume, MD;  Location: [place];  Service: Plastics;  Laterality:  Bilateral;   WOUND DEBRIDEMENT Right 08/29/2018   Procedure: Debridement of ulcer on right fifth metatarsal;  Surgeon: Edrick Kins, DPM;  Location: [place];  Service: Podiatry;  Laterality: Right;     reports that he has never smoked. He has never used smokeless tobacco. He reports that he does not drink alcohol and does not use drugs.  Allergies  Allergen Reactions   Farxiga [Dapagliflozin] Other (See Comments)    Patient reports loss of consciousness   Nsaids Anaphylaxis and Rash    Able to tolerate aspirin    Trulicity [Dulaglutide] Nausea And Vomiting    Family History  Problem Relation Age of Onset   Heart disease Mother        MI   Heart failure Mother    Diabetes Mother        ALSO IN MOST OF HIS SIBLINGS; 2 UNLCES HAVE ALSO PASSED AWAY FROM DM   Cardiomyopathy Mother    Cancer -  Ovarian Mother    Ovarian cancer Mother    Heart disease Father    Hypertension Father    Diabetes Father    Diabetes Sister    Diabetes Brother    Colon cancer Paternal Uncle    Colon cancer Paternal Uncle    Colon polyps Neg Hx    Esophageal cancer Neg Hx    Rectal cancer Neg Hx    Stomach cancer Neg Hx      Prior to Admission medications   Medication Sig Start Date End Date Taking? Authorizing Provider  Accu-Chek Softclix Lancets lancets Use to test blood sugars up to 4 times daily as needed. 12/08/21   [name], MD  acetaminophen-codeine (TYLENOL #3) 300-30 MG tablet Take 1 tablet by mouth every 6 (six) hours as needed for moderate pain. 06/15/22   [name], [name], MD  amitriptyline (ELAVIL) 10 MG tablet Take 1 tablet (10 mg total) by mouth at bedtime. 07/26/22   [name], [name], MD  Ascorbic Acid (VITAMIN C PO) Take 1 tablet by mouth daily.    [provider]  aspirin 81 MG EC tablet Take 1 tablet (81 mg total) by mouth daily. 04/13/21   Danford, Suann Larry, MD  benzonatate (TESSALON) 100 MG capsule Take 1 capsule (100 mg total) by mouth every 8 (eight) hours.  07/09/22   Regan Lemming, MD  blood glucose meter kit and supplies Dispense based on patient and insurance preference. Use up to four times daily as directed. (FOR ICD-10 E10.9, E11.9). 06/15/22   [name], [name], MD  Blood Glucose Monitoring Suppl (BLOOD GLUCOSE MONITOR SYSTEM) w/Device KIT Use to test blood sugar up to 4 times daily as needed. 12/08/21   [name], MD  carvedilol (COREG) 6.25 MG tablet TAKE 1 TABLET BY MOUTH TWICE DAILY WITH A MEAL 05/03/22   [name], [name], MD  cephALEXin (KEFLEX) 500 MG capsule Take 1 capsule (500 mg total) by mouth 4 (four) times daily. 09/21/22   Edrick Kins, DPM  Cholecalciferol (VITAMIN D-3 PO) Take 1 capsule by mouth daily.    [provider]  clopidogrel (PLAVIX) 75 MG tablet Take 1 tablet (75 mg total) by mouth daily. 07/26/22   Bensimhon, Shaune Pascal, MD  gabapentin (NEURONTIN) 100 MG capsule Take 1 capsule (100 mg total) by mouth 2 (two) times daily. 07/26/22   [name], [name], MD  glucose blood (ACCU-CHEK GUIDE) test strip Use to test blood sugars up to 4 times daily as needed. 12/08/21   [name], MD  Insulin Glargine [facility]) 100 UNIT/ML Inject 42 Units into the skin daily. 09/29/22   [name], [name], MD  methocarbamol (ROBAXIN) 500 MG tablet Take 1 tablet (500 mg total) by mouth every 6 (six) hours as needed for muscle spasms. 07/26/22   [name], [name], MD  oxyCODONE (OXY IR/ROXICODONE) 5 MG immediate release tablet Take 0.5-1 tablets (2.5-5 mg total) by mouth every 6 (six) hours as needed for severe pain. 04/13/22   Shelda Pal, DO  pantoprazole (PROTONIX) 40 MG tablet Take 1 tablet (40 mg total) by mouth daily as needed (acid reflux). 02/13/22   [name], [name], MD  potassium chloride SA (KLOR-CON M) 20 MEQ tablet Take 1 tablet (20 mEq total) by mouth daily. 05/05/22   Milford, Maricela Bo, FNP  rosuvastatin (CRESTOR) 40 MG tablet Take 1 tablet (40 mg total) by mouth daily. 07/26/22   Bensimhon,  Shaune Pascal, MD  sacubitril-valsartan (ENTRESTO) 97-103 MG Take 1 tablet  by mouth 2 (two) times daily. 06/29/22   Larey Dresser, MD  spironolactone (ALDACTONE) 25 MG tablet Take 1 tablet (25 mg total) by mouth daily. 07/26/22   Bensimhon, Shaune Pascal, MD  torsemide (DEMADEX) 20 MG tablet Take 2 tablets (40 mg total) by mouth daily. 04/19/22   Rafael Bihari, FNP    Physical Exam: Vitals:   10/23/22 1715 10/23/22 1730 10/23/22 1745 10/23/22 1803  BP:  (!) 131/92 139/76   Pulse: (!) 123 89 93   Resp:  12 (!) 33   Temp:    97.6 F (36.4 C)  TempSrc:    Oral  SpO2: 92% 100% 99%   Weight:      Height:        Constitutional: NAD, calm, comfortable Vitals:   10/23/22 1715 10/23/22 1730 10/23/22 1745 10/23/22 1803  BP:  (!) 131/92 139/76   Pulse: (!) 123 89 93   Resp:  12 (!) 33   Temp:    97.6 F (36.4 C)  TempSrc:    Oral  SpO2: 92% 100% 99%   Weight:      Height:       Eyes: PERRL, lids and conjunctivae normal ENMT: Mucous membranes are dry. Posterior pharynx clear of any exudate or lesions.Normal dentition.  Neck: normal, supple, no masses, no thyromegaly Respiratory: clear to auscultation bilaterally, no wheezing, no crackles. Normal respiratory effort. No accessory muscle use.  Cardiovascular: Regular rate and rhythm, no murmurs / rubs / gallops. No extremity edema. 2+ pedal pulses. No carotid bruits.  Abdomen: no tenderness, no masses palpated. No hepatosplenomegaly. Bowel sounds positive. Left CVA tenderness Musculoskeletal: no clubbing / cyanosis. No joint deformity upper and lower extremities. Good ROM, no contractures. Normal muscle tone.  Skin: no rashes, lesions, ulcers. No induration Neurologic: CN 2-12 grossly intact. Sensation intact, DTR normal. Strength 5/5 in all 4.  Psychiatric: Normal judgment and insight. Alert and oriented x 3. Normal mood.     Labs on Admission: I have personally reviewed following labs and imaging studies  CBC: Recent Labs  Lab  10/21/22 1936 10/23/22 1020  WBC 9.5 10.9*  NEUTROABS 6.8 8.3*  HGB 13.4 13.6  HCT 40.8 41.9  MCV 90.3 91.7  PLT 158 [id number]*   Basic Metabolic Panel: Recent Labs  Lab 10/21/22 1936 10/23/22 1020  NA 140 137  K 4.4 4.5  CL 105 102  CO2 26 23  GLUCOSE 206* 306*  BUN 32* 43*  CREATININE 1.47* 2.46*  CALCIUM 10.1 8.8*   GFR: Estimated Creatinine Clearance: 48.2 mL/min (A) (by C-G formula based on SCr of 2.46 mg/dL (H)). Liver Function Tests: Recent Labs  Lab 10/21/22 1936 10/23/22 1020  AST 17 23  ALT 11 12  ALKPHOS 70 64  BILITOT 0.3 0.5  PROT 8.0 7.2  ALBUMIN 4.2 3.6   Recent Labs  Lab 10/21/22 1936 10/23/22 1020  LIPASE 174* 36   No results for input(s): "AMMONIA" in the last 168 hours. Coagulation Profile: No results for input(s): "INR", "PROTIME" in the last 168 hours. Cardiac Enzymes: No results for input(s): "CKTOTAL", "CKMB", "CKMBINDEX", "TROPONINI" in the last 168 hours. BNP (last 3 results) No results for input(s): "PROBNP" in the last 8760 hours. HbA1C: No results for input(s): "HGBA1C" in the last 72 hours. CBG: Recent Labs  Lab 10/21/22 1929 10/23/22 1115 10/23/22 1704  GLUCAP 184* 290* 219*   Lipid Profile: No results for input(s): "CHOL", "HDL", "LDLCALC", "TRIG", "CHOLHDL", "LDLDIRECT" in the last 72 hours. Thyroid  Function Tests: No results for input(s): "TSH", "T4TOTAL", "FREET4", "T3FREE", "THYROIDAB" in the last 72 hours. Anemia Panel: No results for input(s): "VITAMINB12", "FOLATE", "FERRITIN", "TIBC", "IRON", "RETICCTPCT" in the last 72 hours. Urine analysis:    Component Value Date/Time   COLORURINE YELLOW 10/23/2022 1330   APPEARANCEUR CLOUDY (A) 10/23/2022 1330   LABSPEC 1.008 10/23/2022 1330   PHURINE 5.0 10/23/2022 1330   GLUCOSEU NEGATIVE 10/23/2022 1330   HGBUR SMALL (A) 10/23/2022 1330   BILIRUBINUR NEGATIVE 10/23/2022 1330   KETONESUR NEGATIVE 10/23/2022 1330   PROTEINUR NEGATIVE 10/23/2022 1330   UROBILINOGEN 0.2  12/08/2014 2355   NITRITE NEGATIVE 10/23/2022 1330   LEUKOCYTESUR LARGE (A) 10/23/2022 1330    Radiological Exams on Admission: CT ABDOMEN PELVIS WO CONTRAST  Result Date: 10/23/2022 CLINICAL DATA:  Severe pancreatitis EXAM: CT ABDOMEN AND PELVIS WITHOUT CONTRAST TECHNIQUE: Multidetector CT imaging of the abdomen and pelvis was performed following the standard protocol without IV contrast. RADIATION DOSE REDUCTION: This exam was performed according to the departmental dose-optimization program which includes automated exposure control, adjustment of the mA and/or kV according to patient size and/or use of iterative reconstruction technique. COMPARISON:  Renal stone CT 03/15/2022 and older FINDINGS: Lower chest: There is some breathing motion at the lung bases. Mild basilar atelectasis. No effusion. The heart is enlarged. Pacemaker leads along the right side of the heart. Slightly patulous thoracic esophagus. Hepatobiliary: On this non IV contrast exam, the liver is grossly preserved. Pancreas: Pancreas is nonenlarged. No significant peripancreatic fat stranding fluid collection. No CT sequela pancreatitis on this noncontrast exam. Sensitivity can be increased with a contrast exam as clinically appropriate. Spleen: Normal in size without focal abnormality. Adrenals/Urinary Tract: Stable mild thickening of the right adrenal gland. Left adrenal gland is preserved. No abnormal calcification seen within either kidney nor along the course of either ureter. Preserved contours of the urinary bladder. Stomach/Bowel: The large bowel has a normal course and caliber with scattered stool. Mild stool burden. Normal appendix extends medial and inferior to the cecum in the right hemipelvis. There is mild debris in the stomach. Small bowel is nondilated. Vascular/Lymphatic: Mild vascular calcifications along the aorta and branch vessels. Normal caliber aorta and IVC. No specific abnormal lymph node enlargement present in the  abdomen and pelvis. Reproductive: Prostate is unremarkable. Other: Small fat containing right inguinal hernia. Musculoskeletal: Mild degenerative changes along the spine. IMPRESSION: No obstructing renal stone.  Mild stool.  Normal appendix. No sequela of pancreatitis seen on this noncontrast CT examination. Electronically Signed   By: Jill Side M.D.   On: 10/23/2022 12:45   DG Chest Portable 1 View  Result Date: 10/23/2022 CLINICAL DATA:  Shortness of EXAM: PORTABLE CHEST 1 VIEW COMPARISON:  Radiograph 10/21/2022 FINDINGS: Unchanged enlarged cardiac silhouette with single lead AICD. Faint opacity overlying the region of the lingula. No large effusion or evidence of pneumothorax. No acute osseous abnormality. Thoracic spondylosis. IMPRESSION: Faint opacity in the region of the lingula, could be atelectasis or developing infection. Unchanged cardiomegaly.  No overt pulmonary edema or large effusion. Electronically Signed   By: Maurine Simmering M.D.   On: 10/23/2022 10:23   DG Chest Portable 1 View  Result Date: 10/21/2022 CLINICAL DATA:  Shortness of breath, generalized abdominal pain, nausea, vomiting EXAM: PORTABLE CHEST 1 VIEW COMPARISON:  Portable exam 2046 hours compared to 07/09/2022 FINDINGS: LEFT subclavian ICD with lead projecting over RIGHT ventricle. Upper normal heart size. Mediastinal contours and pulmonary vascularity normal. Lungs clear. No pulmonary infiltrate, pleural  effusion, or pneumothorax. Osseous structures unremarkable. IMPRESSION: No acute abnormalities. Electronically Signed   By: Lavonia Dana M.D.   On: 10/21/2022 20:52    EKG: Independently reviewed.  Sinus, chronic LBBB  Assessment/Plan Principal Problem:   AKI (acute kidney injury) ([place]) Active Problems:   CAD (coronary artery disease) with flat troponin    Chronic systolic heart failure (HCC)   CKD (chronic kidney disease) stage 3, GFR 30-59 ml/min (HCC)   Obstructive sleep apnea   Obesity, Class III, BMI 40-49.9  (morbid obesity) ([place])   UTI (urinary tract infection)  (please populate well all problems here in Problem List. (For example, if patient is on BP meds at home and you resume or decide to hold them, it is a problem that needs to be her. Same for CAD, COPD, HLD and so on)  AKI -Likely prerenal secondary secondary to poor oral intake -Clinically patient still has symptoms signs of volume contraction, hold off his CHF/BP med, including torsemide, Entresto and spironolactone -Start IV fluid normal saline 100 mL/h x 12 hours, admitted to telemetry for close monitoring of volume status.  Recheck chest x-ray tomorrow.  Recheck kidney function tomorrow -Imaging study showed no significant renal or ureter obstruction. -Uric acid and CK level  Acute left-sided pyelonephritis versus complicated UTI -With gross hematuria which is new compared to UA 3 days ago. -Agreed with ceftriaxone 2 gm daily -Appears to have undiagnosed BPH, check PVR Q shift x 3 -Start Flomax tomorrow -Outpatient referral to see urology for urodynamic study.  Referral information put in discharge paperwork  Question of pancreatitis -Patient is a nondrinker and no history of gallstones.  LFTs including bilirubin within normal limits and lipase level trending down without any intervention over the last 2 days.  Overall, less suspicious for acute pancreatitis.  Elevation of lipase might related to repeated nauseous vomiting on Friday. -CT abdominal pelvis reassuring, no intervention indicated.  Unlikely to cause his symptoms.  Chronic HFrEF -Volume contracted, hold off CHF/BP meds above.  IDDM with hyperglycemia -Resume Lantus -Sliding scale  Morbid obesity -Recommend considering outpatient bariatric evaluation  OSA -CPAP at bedtime  DVT prophylaxis: Heparin subcu Code Status: Full code Family Communication: None at bedside Disposition Plan: Expect less than 2 midnight hospital stay Consults called: None Admission  status: Tele obs   Lequita Halt MD [facility] Pager [phone]  10/23/2022, 6:19 PM

## 2022-10-23 NOTE — ED Notes (Signed)
Per Dr. Velia Meyer, give scheduled insulin plus 5 units sliding scale and recheck CBG in an hour. Call back if CBG remains greater than 400.

## 2022-10-23 NOTE — ED Provider Notes (Signed)
I was summoned to the room as the patient had a brief episode of transient unresponsive with hypotension, in the setting of having received 2 rounds of IV Dilaudid.  Patient had blood pressure of 60/40 on my assessment in the room and is in a sinus rhythm, with pinpoint pupils and not responding to voice, sluggishly responding only to sternal rub.  He was given IV Narcan.  He was placed in Trendelenburg position.  He was also bolused with fluid.  His blood pressure quickly improved, as well as mental status, and he was able to speak to me.  BP 100/60 now.    I suspect this episode may be multifactorial but likely related to some prerenal dehydration based on his lab work, as well as 2 rounds of IV Dilaudid given in the setting of a near-AKI.  We will reassess his glucose and mentation. I anticipate medical admission at this point.   Wyvonnia Dusky, MD 10/23/22 [phone]

## 2022-10-23 NOTE — ED Notes (Signed)
Unsuccessful attempt at blood draw for cultures.

## 2022-10-23 NOTE — Progress Notes (Signed)
Pt refusing cpap for the night. ?

## 2022-10-23 NOTE — Progress Notes (Signed)
Latest CBG at 2300 - 349mg /dl

## 2022-10-23 NOTE — ED Notes (Signed)
Left message for [facility] requesting return call to discuss CBG 410 and request orders.

## 2022-10-23 NOTE — ED Notes (Addendum)
Pt c/o nausea and diaphoretic on assessment nausea meds gave and BP rechecked. Pt reports feeling dizzy and having an upset stomach. MD notified and came to bedside, verbal orders made.

## 2022-10-23 NOTE — ED Provider Notes (Signed)
[facility] Provider Note   CSN: [id number] Arrival date & time: 10/23/22  [id number]     History Chief Complaint  Patient presents with   Abdominal Pain    Mitchell [name] is a 52 y.o. male.  Patient with past history significant for obesity, type 2 diabetes, chronic systolic heart failure, hyperlipidemia presents emergency department complaints of abdominal pain.  Patient reports that he was here in the emergency department few days ago for the same, abdominal pain but does return as he feels that this pain has not been improving since his last visit.  He reports majority of his pain is on the left side of his abdomen towards his left flank with radiation towards his bladder.  Denies any right-sided abdominal pain or epigastric tenderness.  Denies any nausea, vomiting, diarrhea.  No recent fevers.  Patient reports urethral tenderness but denies any obvious hematuria, increased urinary frequency or urgency. Patient not currently receiving dialysis but does see nephrologist and states that nephrologist cleared him for annual follow ups unless status changes.   Abdominal Pain      Home Medications Prior to Admission medications   Medication Sig Start Date End Date Taking? Authorizing Provider  Accu-Chek Softclix Lancets lancets Use to test blood sugars up to 4 times daily as needed. 12/08/21   [name], MD  acetaminophen-codeine (TYLENOL #3) 300-30 MG tablet Take 1 tablet by mouth every 6 (six) hours as needed for moderate pain. 06/15/22   [name], [name], MD  amitriptyline (ELAVIL) 10 MG tablet Take 1 tablet (10 mg total) by mouth at bedtime. 07/26/22   [name], [name], MD  Ascorbic Acid (VITAMIN C PO) Take 1 tablet by mouth daily.    [provider]  aspirin 81 MG EC tablet Take 1 tablet (81 mg total) by mouth daily. 04/13/21   Danford, Suann Larry, MD  benzonatate (TESSALON) 100 MG capsule Take 1 capsule (100 mg total) by  mouth every 8 (eight) hours. 07/09/22   Regan Lemming, MD  blood glucose meter kit and supplies Dispense based on patient and insurance preference. Use up to four times daily as directed. (FOR ICD-10 E10.9, E11.9). 06/15/22   [name], [name], MD  Blood Glucose Monitoring Suppl (BLOOD GLUCOSE MONITOR SYSTEM) w/Device KIT Use to test blood sugar up to 4 times daily as needed. 12/08/21   [name], MD  carvedilol (COREG) 6.25 MG tablet TAKE 1 TABLET BY MOUTH TWICE DAILY WITH A MEAL 05/03/22   [name], [name], MD  cephALEXin (KEFLEX) 500 MG capsule Take 1 capsule (500 mg total) by mouth 4 (four) times daily. 09/21/22   Edrick Kins, DPM  Cholecalciferol (VITAMIN D-3 PO) Take 1 capsule by mouth daily.    [provider]  clopidogrel (PLAVIX) 75 MG tablet Take 1 tablet (75 mg total) by mouth daily. 07/26/22   Bensimhon, Shaune Pascal, MD  gabapentin (NEURONTIN) 100 MG capsule Take 1 capsule (100 mg total) by mouth 2 (two) times daily. 07/26/22   [name], [name], MD  glucose blood (ACCU-CHEK GUIDE) test strip Use to test blood sugars up to 4 times daily as needed. 12/08/21   [name], MD  Insulin Glargine [facility]) 100 UNIT/ML Inject 42 Units into the skin daily. 09/29/22   [name], [name], MD  methocarbamol (ROBAXIN) 500 MG tablet Take 1 tablet (500 mg total) by mouth every 6 (six) hours as needed for muscle spasms. 07/26/22   [name], [name],  MD  oxyCODONE (OXY IR/ROXICODONE) 5 MG immediate release tablet Take 0.5-1 tablets (2.5-5 mg total) by mouth every 6 (six) hours as needed for severe pain. 04/13/22   Shelda Pal, DO  pantoprazole (PROTONIX) 40 MG tablet Take 1 tablet (40 mg total) by mouth daily as needed (acid reflux). 02/13/22   [name], [name], MD  potassium chloride SA (KLOR-CON M) 20 MEQ tablet Take 1 tablet (20 mEq total) by mouth daily. 05/05/22   Milford, Maricela Bo, FNP  rosuvastatin (CRESTOR) 40 MG tablet Take 1 tablet (40 mg total) by mouth  daily. 07/26/22   Bensimhon, Shaune Pascal, MD  sacubitril-valsartan (ENTRESTO) 97-103 MG Take 1 tablet by mouth 2 (two) times daily. 06/29/22   Larey Dresser, MD  spironolactone (ALDACTONE) 25 MG tablet Take 1 tablet (25 mg total) by mouth daily. 07/26/22   Bensimhon, Shaune Pascal, MD  torsemide (DEMADEX) 20 MG tablet Take 2 tablets (40 mg total) by mouth daily. 04/19/22   Milford, Maricela Bo, FNP      Allergies    [name] [dapagliflozin], Nsaids, and Trulicity [dulaglutide]    Review of Systems   Review of Systems  Gastrointestinal:  Positive for abdominal pain.    Physical Exam Updated Vital Signs BP 139/76   Pulse 93   Temp 97.6 F (36.4 C) (Oral)   Resp (!) 33   Ht 5\' 11"  (1.803 m)   Wt 135.2 kg   SpO2 99%   BMI 41.56 kg/m  Physical Exam Vitals and nursing note reviewed.  Constitutional:      General: He is not in acute distress.    Appearance: He is well-developed.  HENT:     Head: Normocephalic and atraumatic.  Eyes:     Conjunctiva/sclera: Conjunctivae normal.  Cardiovascular:     Rate and Rhythm: Normal rate and regular rhythm.     Heart sounds: No murmur heard. Pulmonary:     Effort: Pulmonary effort is normal. No respiratory distress.     Breath sounds: Normal breath sounds.  Abdominal:     Palpations: Abdomen is soft.     Tenderness: There is no abdominal tenderness. There is left CVA tenderness. There is no right CVA tenderness, guarding or rebound. Negative signs include Murphy's sign, Rovsing's sign and McBurney's sign.  Musculoskeletal:        General: No swelling.     Cervical back: Neck supple.  Skin:    General: Skin is warm and dry.     Capillary Refill: Capillary refill takes less than 2 seconds.  Neurological:     Mental Status: He is alert.  Psychiatric:        Mood and Affect: Mood normal.     ED Results / Procedures / Treatments   Labs (all labs ordered are listed, but only abnormal results are displayed) Labs Reviewed  CBC WITH  DIFFERENTIAL/PLATELET - Abnormal; Notable for the following components:      Result Value   WBC 10.9 (*)    Platelets 148 (*)    Neutro Abs 8.3 (*)    All other components within normal limits  COMPREHENSIVE METABOLIC PANEL - Abnormal; Notable for the following components:   Glucose, Bld 306 (*)    BUN 43 (*)    Creatinine, Ser 2.46 (*)    Calcium 8.8 (*)    GFR, Estimated 30 (*)    All other components within normal limits  URINALYSIS, ROUTINE W REFLEX MICROSCOPIC - Abnormal; Notable for the following components:   APPearance  CLOUDY (*)    Hgb urine dipstick SMALL (*)    Leukocytes,Ua LARGE (*)    Bacteria, UA RARE (*)    All other components within normal limits  CBG MONITORING, ED - Abnormal; Notable for the following components:   Glucose-Capillary 290 (*)    All other components within normal limits  CBG MONITORING, ED - Abnormal; Notable for the following components:   Glucose-Capillary 219 (*)    All other components within normal limits  URINE CULTURE  CULTURE, BLOOD (ROUTINE X 2)  CULTURE, BLOOD (ROUTINE X 2)  LIPASE, BLOOD  BRAIN NATRIURETIC PEPTIDE  LACTIC ACID, PLASMA  LACTIC ACID, PLASMA  HIV ANTIBODY (ROUTINE TESTING W REFLEX)  BASIC METABOLIC PANEL  CBC  URIC ACID  CK    EKG EKG Interpretation  Date/Time:  Monday October 23 2022 10:06:09 EDT Ventricular Rate:  91 PR Interval:  179 QRS Duration: 133 QT Interval:  426 QTC Calculation: 525 R Axis:   -56 Text Interpretation: Sinus rhythm Left bundle branch block No significant change since last tracing Confirmed by Aletta Edouard [phone]) on 10/23/2022 10:09:50 AM  Radiology CT ABDOMEN PELVIS WO CONTRAST  Result Date: 10/23/2022 CLINICAL DATA:  Severe pancreatitis EXAM: CT ABDOMEN AND PELVIS WITHOUT CONTRAST TECHNIQUE: Multidetector CT imaging of the abdomen and pelvis was performed following the standard protocol without IV contrast. RADIATION DOSE REDUCTION: This exam was performed according to the  departmental dose-optimization program which includes automated exposure control, adjustment of the mA and/or kV according to patient size and/or use of iterative reconstruction technique. COMPARISON:  Renal stone CT 03/15/2022 and older FINDINGS: Lower chest: There is some breathing motion at the lung bases. Mild basilar atelectasis. No effusion. The heart is enlarged. Pacemaker leads along the right side of the heart. Slightly patulous thoracic esophagus. Hepatobiliary: On this non IV contrast exam, the liver is grossly preserved. Pancreas: Pancreas is nonenlarged. No significant peripancreatic fat stranding fluid collection. No CT sequela pancreatitis on this noncontrast exam. Sensitivity can be increased with a contrast exam as clinically appropriate. Spleen: Normal in size without focal abnormality. Adrenals/Urinary Tract: Stable mild thickening of the right adrenal gland. Left adrenal gland is preserved. No abnormal calcification seen within either kidney nor along the course of either ureter. Preserved contours of the urinary bladder. Stomach/Bowel: The large bowel has a normal course and caliber with scattered stool. Mild stool burden. Normal appendix extends medial and inferior to the cecum in the right hemipelvis. There is mild debris in the stomach. Small bowel is nondilated. Vascular/Lymphatic: Mild vascular calcifications along the aorta and branch vessels. Normal caliber aorta and IVC. No specific abnormal lymph node enlargement present in the abdomen and pelvis. Reproductive: Prostate is unremarkable. Other: Small fat containing right inguinal hernia. Musculoskeletal: Mild degenerative changes along the spine. IMPRESSION: No obstructing renal stone.  Mild stool.  Normal appendix. No sequela of pancreatitis seen on this noncontrast CT examination. Electronically Signed   By: Jill Side M.D.   On: 10/23/2022 12:45   DG Chest Portable 1 View  Result Date: 10/23/2022 CLINICAL DATA:  Shortness of  EXAM: PORTABLE CHEST 1 VIEW COMPARISON:  Radiograph 10/21/2022 FINDINGS: Unchanged enlarged cardiac silhouette with single lead AICD. Faint opacity overlying the region of the lingula. No large effusion or evidence of pneumothorax. No acute osseous abnormality. Thoracic spondylosis. IMPRESSION: Faint opacity in the region of the lingula, could be atelectasis or developing infection. Unchanged cardiomegaly.  No overt pulmonary edema or large effusion. Electronically Signed   By: Edison Nasuti  Chancy Milroy M.D.   On: 10/23/2022 10:23   DG Chest Portable 1 View  Result Date: 10/21/2022 CLINICAL DATA:  Shortness of breath, generalized abdominal pain, nausea, vomiting EXAM: PORTABLE CHEST 1 VIEW COMPARISON:  Portable exam 2046 hours compared to 07/09/2022 FINDINGS: LEFT subclavian ICD with lead projecting over RIGHT ventricle. Upper normal heart size. Mediastinal contours and pulmonary vascularity normal. Lungs clear. No pulmonary infiltrate, pleural effusion, or pneumothorax. Osseous structures unremarkable. IMPRESSION: No acute abnormalities. Electronically Signed   By: Lavonia Dana M.D.   On: 10/21/2022 20:52    Procedures Procedures   Medications Ordered in ED Medications  cefTRIAXone (ROCEPHIN) 1 g in sodium chloride 0.9 % 100 mL IVPB (has no administration in time range)  acetaminophen-codeine (TYLENOL #3) 300-30 MG per tablet 1 tablet (has no administration in time range)  aspirin EC tablet 81 mg (has no administration in time range)  oxyCODONE (Oxy IR/ROXICODONE) immediate release tablet 2.5-5 mg (has no administration in time range)  carvedilol (COREG) tablet 6.25 mg (has no administration in time range)  rosuvastatin (CRESTOR) tablet 40 mg (has no administration in time range)  amitriptyline (ELAVIL) tablet 10 mg (has no administration in time range)  pantoprazole (PROTONIX) EC tablet 40 mg (has no administration in time range)  clopidogrel (PLAVIX) tablet 75 mg (has no administration in time range)   gabapentin (NEURONTIN) capsule 100 mg (has no administration in time range)  methocarbamol (ROBAXIN) tablet 500 mg (has no administration in time range)  heparin injection 5,000 Units (has no administration in time range)  0.9 %  sodium chloride infusion (has no administration in time range)  HYDROmorphone (DILAUDID) injection 0.5-1 mg (has no administration in time range)  insulin aspart (novoLOG) injection 0-15 Units (has no administration in time range)  insulin aspart (novoLOG) injection 0-5 Units (has no administration in time range)  linagliptin (TRADJENTA) tablet 5 mg (has no administration in time range)  insulin glargine-yfgn (SEMGLEE) injection 42 Units (has no administration in time range)  sodium chloride 0.9 % bolus 500 mL (0 mLs Intravenous Stopped 10/23/22 1300)  HYDROmorphone (DILAUDID) injection 0.5 mg (0.5 mg Intravenous Given 10/23/22 1400)  cefTRIAXone (ROCEPHIN) 1 g in sodium chloride 0.9 % 100 mL IVPB (0 g Intravenous Stopped 10/23/22 1649)  HYDROmorphone (DILAUDID) injection 1 mg (1 mg Intravenous Given 10/23/22 1555)  metoCLOPramide (REGLAN) injection 10 mg (10 mg Intravenous Given 10/23/22 1649)  sodium chloride 0.9 % bolus 1,000 mL (1,000 mLs Intravenous New Bag/Given 10/23/22 1658)  naloxone (NARCAN) 2 MG/2ML injection (2 mg  Given 10/23/22 1658)    ED Course/ Medical Decision Making/ A&P Clinical Course as of 10/23/22 1829  Mon Oct 23, 2022  1250 WBC(!): 10.9 [OZ]  1529 CT ABDOMEN PELVIS WO CONTRAST [OZ]  1535 B Natriuretic Peptide: 48.8 Normal BNP, not fluid overloaded [OZ]  1749 Will need admission [OZ]    Clinical Course User Index [OZ] Luvenia Heller, PA-C                           Medical Decision Making Amount and/or Complexity of Data Reviewed Labs: ordered. Decision-making details documented in ED Course. Radiology: ordered. Decision-making details documented in ED Course.  Risk Prescription drug management.   This patient presents to the ED for  concern of abdominal pain.  Differential diagnosis includes DKA, bowel obstruction, gastroenteritis, nephrolithiasis   Lab Tests:  I Ordered, and personally interpreted labs.  The pertinent results include: Hyperglycemia noted with elevated blood glucose  on CBG multiple times and with CMP at 306, elevated BUN and creatinine with BUN of 43 and creatinine 2.46.  GFR down to 30.  No anion gap present.  Urinalysis consistent with evidence of infection with small blood noted as well as leukocytes and bacteria present.  CBC with slight leukocytosis at 10.9.  BMP normal at 48.8.   Imaging Studies ordered:  I ordered imaging studies including chest x-ray, CT abdomen pelvis I independently visualized and interpreted imaging which showed no acute cardiopulmonary disease, no acute abdominal pathology to account for discomfort and pain but there is some moderate amount of stool noted I agree with the radiologist interpretation   Medicines ordered and prescription drug management:  I ordered medication including fluids, Dilaudid, Reglan, Rocephin, Narcan for dehydration, pain, nausea, suspected urinary tract infection, accidental overdose Reevaluation of the patient after these medicines showed that the patient improved I have reviewed the patients home medicines and have made adjustments as needed   Problem List / ED Course:  Patient presents to the emergency department complaints of abdominal pain.  He reports this has been present for about a week or so and is on his left side of abdomen.  He denies any left or right flank pain but does report that he had some increasing pain and discomfort with urination.  Has not recently treated for any urinary tract infections or other urinary infections.  In the process of lab workup, AKI was noted with elevated BUN and creatinine as well as drop in GFR from baseline.  Fluids were initiated with gentle rehydration given patient's CHF status to rehydrate patient as  this is likely a prerenal cause.  Pain management was initiated with 0.5 mg of Dilaudid which patient reports did not manage symptoms well.  Patient was redosed on Dilaudid approximately 2 hours after initial dose with increase from 0.5 mg to 1 mg.  Approximately 1 hour after administration of 1 mg of Dilaudid, patient became diaphoretic, hypotensive, and nauseous.  Patient did also become briefly unresponsive to touch but was responsive to painful stimuli with sternal rub.  Dose of Narcan was administered to patient via IV which helped patient to return back to baseline slowly.  Another bolus of fluids was initiated as patient was once again hypertensive.  Given complex picture patient and multiple episodes of significant hypotension, will admit patient for further management.  Spoke with Dr. Roosevelt Locks who is agreeable that patient meets criteria for admission given AKI as well as hypotension and accidental overdose.  Informed patient of plan for inpatient admission and patient was agreeable with this plan.  Patient will be continuously monitored until he is removed from the floor for any signs of rebound from Dilaudid administration and will be redosed with Narcan if needed.   Final Clinical Impression(s) / ED Diagnoses Final diagnoses:  AKI (acute kidney injury) ([place])  Opioid overdose, accidental or unintentional, initial encounter [facility])    Rx / [place] Orders ED Discharge Orders     None         Vladimir Creeks 10/23/22 1829    Hayden Rasmussen, MD 10/24/22 1734

## 2022-10-23 NOTE — ED Triage Notes (Addendum)
Pt to ED via EMS from home with c/o abd pain onset 3-4 days ago. Pt was seen for issue and was told his lipase was elevated. Pt states the pain is getting worse and he has SOB. Pt Aox4 in triage. Pt reports N/V.

## 2022-10-23 NOTE — Telephone Encounter (Signed)
Pt states he was advised at the ED to f/u with [place]. After going over his sxs with Maudie Mercury, it was advised he go back to the ED as the pain has gotten significantly worse. Pt voiced understanding and stated he will head on his way.

## 2022-10-24 ENCOUNTER — Observation Stay (HOSPITAL_COMMUNITY): Payer: No Typology Code available for payment source

## 2022-10-24 DIAGNOSIS — I5022 Chronic systolic (congestive) heart failure: Secondary | ICD-10-CM

## 2022-10-24 DIAGNOSIS — N179 Acute kidney failure, unspecified: Secondary | ICD-10-CM | POA: Diagnosis not present

## 2022-10-24 LAB — BASIC METABOLIC PANEL
Anion gap: 10 (ref 5–15)
BUN: 46 mg/dL — ABNORMAL HIGH (ref 6–20)
CO2: 26 mmol/L (ref 22–32)
Calcium: 8.3 mg/dL — ABNORMAL LOW (ref 8.9–10.3)
Chloride: 101 mmol/L (ref 98–111)
Creatinine, Ser: 2.14 mg/dL — ABNORMAL HIGH (ref 0.61–1.24)
GFR, Estimated: 36 mL/min — ABNORMAL LOW (ref >60)
Glucose, Bld: 326 mg/dL — ABNORMAL HIGH (ref 70–99)
Potassium: 4.7 mmol/L (ref 3.5–5.1)
Sodium: 137 mmol/L (ref 135–145)

## 2022-10-24 LAB — CBC
HCT: 38.1 % — ABNORMAL LOW (ref 39.0–52.0)
Hemoglobin: 12.3 g/dL — ABNORMAL LOW (ref 13.0–17.0)
MCH: 29.6 pg (ref 26.0–34.0)
MCHC: 32.3 g/dL (ref 30.0–36.0)
MCV: 91.8 fL (ref 80.0–100.0)
Platelets: 134 10*3/uL — ABNORMAL LOW (ref 150–400)
RBC: 4.15 MIL/uL — ABNORMAL LOW (ref 4.22–5.81)
RDW: 12.1 % (ref 11.5–15.5)
WBC: 12.3 10*3/uL — ABNORMAL HIGH (ref 4.0–10.5)
nRBC: 0 % (ref 0.0–0.2)

## 2022-10-24 LAB — GLUCOSE, CAPILLARY
Glucose-Capillary: 349 mg/dL — ABNORMAL HIGH (ref 70–99)
Glucose-Capillary: 167 mg/dL — ABNORMAL HIGH (ref 70–99)
Glucose-Capillary: 150 mg/dL — ABNORMAL HIGH (ref 70–99)
Glucose-Capillary: 118 mg/dL — ABNORMAL HIGH (ref 70–99)
Glucose-Capillary: 209 mg/dL — ABNORMAL HIGH (ref 70–99)

## 2022-10-24 MED ORDER — POLYETHYLENE GLYCOL 3350 17 G PO PACK
17.0000 g | PACK | Freq: Every day | ORAL | Status: DC
  Administered 2022-10-24 – 2022-10-26 (×3): 17 g via ORAL
  Filled 2022-10-24 (×3): qty 1

## 2022-10-24 MED ORDER — INSULIN ASPART 100 UNIT/ML IJ SOLN
0.0000 [IU] | Freq: Three times a day (TID) | INTRAMUSCULAR | Status: DC
  Administered 2022-10-25: 2 [IU] via SUBCUTANEOUS
  Administered 2022-10-25 – 2022-10-26 (×3): 3 [IU] via SUBCUTANEOUS
  Administered 2022-10-26: 2 [IU] via SUBCUTANEOUS
  Administered 2022-10-26 – 2022-10-27 (×2): 3 [IU] via SUBCUTANEOUS

## 2022-10-24 MED ORDER — LIDOCAINE 5 % EX PTCH
1.0000 | MEDICATED_PATCH | CUTANEOUS | Status: DC
  Administered 2022-10-24 – 2022-10-26 (×3): 1 via TRANSDERMAL
  Filled 2022-10-24 (×3): qty 1

## 2022-10-24 MED ORDER — SENNOSIDES-DOCUSATE SODIUM 8.6-50 MG PO TABS
1.0000 | ORAL_TABLET | Freq: Two times a day (BID) | ORAL | Status: DC
  Administered 2022-10-24 – 2022-10-26 (×5): 1 via ORAL
  Filled 2022-10-24 (×6): qty 1

## 2022-10-24 NOTE — Consult Note (Signed)
Advanced Heart Failure Team Consult Note   Primary Physician: [name], MD PCP-Cardiologist:  Glori Bickers, MD  Reason for Consultation: Medication Management of Chronic Systolic Heart Failure in Setting of AKI   HPI:    Mitchell [name] is seen today for evaluation of chronic systolic heart failure management in the setting of AKI at the request of Dr. Erlinda Hong.   Mitchell [name] is a 52 y.o.male with multiple medical problems including HFrEF, mixed ischemic/nonischemic CM, 3v CAD, obesity, hypertension, type II DM, obstructive sleep apnea, bipolar disease/schizophrenia, prior CVA.    Had Mitchell [name] on 09/26/17 with 3v disease and preserved CO/CI. No revascularization options. Saw EP about ICD but did not f/u.    Patient admitted June 2022 with sepsis secondary to RLE abscess/cellulitis and COVID-19. He ended up requiring R BKA.   Readmitted 04/11/21 with a/c HFrEF and NSTEMI.  He was diuresed with IV lasix and started on GDMT. L/RHC with severe 3 vessel CAD (no targets for intervention), RA 4, PA 49/17 (31), PCW 15, Fick CO/index 6.7/2.7. He was referred to paramedicine program at discharge. Visual impairment felt to be contributor to issues with medication compliance.   Echo 2/23, EF 30-35%, RV ok. IVC not dilated. Seen at Urgent Care 2/23 and treated for simple UTI, UA+, CT w/ no evidence of pyelo or kidney stone.   Follow up 5/23, doing well, volume ok. Referred for ICD.    Seen in ED 12/08/21 for near syncope, ? hypoglycemic episode vs arrhythmia. Frequent PVCs on tele and Zio AT 14 day placed to quantify. Patient requested to schedule ICD implant.   S/p MDT ICD 6/23.   Now admitted w/ complicated UTI and AKI. B/l Scr ~1.3-1.5. SCr was 1.47 on admit and bumped to 2.46 overnight.  Was hypotensive overnight, SBPs dropped in the 70s. Per chart review, pt complained of 10/10 pain while in the ED and he was given 2 doses of morphine and became hypotensive and unresponsive. Responded  to 1 dose of narcan.  WBC 10.9. UA showed gross hematuria. Patient was started on ceftriaxone. CT abdominal pelvis showed no acute signs of acute pancreatitis and no significant renal/ureter obstructions.   BPs remain soft in the 0000000 systolic. Blood Cx NGTD. Home HF regimen/diuretics on hold. Was on Entersto, spiro and torsemide PTA. Not on an SGLT2i.   LFTs normal. BNP normal at 48 on admission. CXR unremarkable.   Reports stable NYHA Class II symptoms.   Echo 08/2021 1. Left ventricular ejection fraction, by estimation, is 30 to 35%. The  left ventricle has moderately decreased function. The left ventricle has  no regional wall motion abnormalities. Left ventricular diastolic  parameters are consistent with Grade II  diastolic dysfunction (pseudonormalization). There is akinesis of the left  ventricular, mid-apical anteroseptal wall, anterior wall, apical segment  and inferoseptal wall.   2. Right ventricular systolic function is normal. The right ventricular  size is normal.   3. Left atrial size was moderately dilated.   4. Right atrial size was moderately dilated.   5. The mitral valve is normal in structure. Trivial mitral valve  regurgitation. No evidence of mitral stenosis.   6. The aortic valve is normal in structure. Aortic valve regurgitation is  not visualized. No aortic stenosis is present.   7. The inferior vena cava is normal in size with greater than 50%  respiratory variability, suggesting right atrial pressure of 3 mmHg.   Review of Systems: [y] = yes, [ ]  =  no   General: Weight gain [ ] ; Weight loss [ ] ; Anorexia [ ] ; Fatigue [ ] ; Fever [ ] ; Chills [ ] ; Weakness [ ]   Cardiac: Chest pain/pressure [ ] ; Resting SOB [ ] ; Exertional SOB [ ] ; Orthopnea [ ] ; Pedal Edema [ ] ; Palpitations [ ] ; Syncope [ ] ; Presyncope [ ] ; Paroxysmal nocturnal dyspnea[ ]   Pulmonary: Cough [ ] ; Wheezing[ ] ; Hemoptysis[ ] ; Sputum [ ] ; Snoring [ ]   GI: Vomiting[ ] ; Dysphagia[ ] ; Melena[ ] ;  Hematochezia [ ] ; Heartburn[ ] ; Abdominal pain [ ] ; Constipation [ ] ; Diarrhea [ ] ; BRBPR [ ]   GU: Hematuria[Y ]; Dysuria [Y ]; Nocturia[ ]   Vascular: Pain in legs with walking [ ] ; Pain in feet with lying flat [ ] ; Non-healing sores [ ] ; Stroke [ ] ; TIA [ ] ; Slurred speech [ ] ;  Neuro: Headaches[ ] ; Vertigo[ ] ; Seizures[ ] ; Paresthesias[ ] ;Blurred vision [ ] ; Diplopia [ ] ; Vision changes [ ]   Ortho/Skin: Arthritis [ ] ; Joint pain [ ] ; Muscle pain [ ] ; Joint swelling [ ] ; Back Pain [ ] ; Rash [ ]   Psych: Depression[ ] ; Anxiety[ ]   Heme: Bleeding problems [ ] ; Clotting disorders [ ] ; Anemia [ ]   Endocrine: Diabetes [ ] ; Thyroid dysfunction[ ]   Home Medications Prior to Admission medications   Medication Sig Start Date End Date Taking? Authorizing Provider  Accu-Chek Softclix Lancets lancets Use to test blood sugars up to 4 times daily as needed. 12/08/21   [name], MD  acetaminophen-codeine (TYLENOL #3) 300-30 MG tablet Take 1 tablet by mouth every 6 (six) hours as needed for moderate pain. 06/15/22   [name], [name], MD  amitriptyline (ELAVIL) 10 MG tablet Take 1 tablet (10 mg total) by mouth at bedtime. 07/26/22   [name], [name], MD  Ascorbic Acid (VITAMIN C PO) Take 1 tablet by mouth daily.    [provider]  aspirin 81 MG EC tablet Take 1 tablet (81 mg total) by mouth daily. 04/13/21   Danford, Suann Larry, MD  blood glucose meter kit and supplies Dispense based on patient and insurance preference. Use up to four times daily as directed. (FOR ICD-10 E10.9, E11.9). 06/15/22   [name], [name], MD  Blood Glucose Monitoring Suppl (BLOOD GLUCOSE MONITOR SYSTEM) w/Device KIT Use to test blood sugar up to 4 times daily as needed. 12/08/21   [name], MD  carvedilol (COREG) 6.25 MG tablet TAKE 1 TABLET BY MOUTH TWICE DAILY WITH A MEAL 05/03/22   [name], [name], MD  Cholecalciferol (VITAMIN D-3 PO) Take 1 capsule by mouth daily.    [provider]   clopidogrel (PLAVIX) 75 MG tablet Take 1 tablet (75 mg total) by mouth daily. 07/26/22   Bensimhon, Shaune Pascal, MD  gabapentin (NEURONTIN) 100 MG capsule Take 1 capsule (100 mg total) by mouth 2 (two) times daily. 07/26/22   [name], [name], MD  glucose blood (ACCU-CHEK GUIDE) test strip Use to test blood sugars up to 4 times daily as needed. 12/08/21   [name], MD  Insulin Glargine [facility]) 100 UNIT/ML Inject 42 Units into the skin daily. 09/29/22   [name], [name], MD  methocarbamol (ROBAXIN) 500 MG tablet Take 1 tablet (500 mg total) by mouth every 6 (six) hours as needed for muscle spasms. 07/26/22   [name], [name], MD  pantoprazole (PROTONIX) 40 MG tablet Take 1 tablet (40 mg total) by mouth daily as needed (acid reflux). 02/13/22   [name], [name], MD  potassium  chloride SA (KLOR-CON M) 20 MEQ tablet Take 1 tablet (20 mEq total) by mouth daily. 05/05/22   Milford, Maricela Bo, FNP  rosuvastatin (CRESTOR) 40 MG tablet Take 1 tablet (40 mg total) by mouth daily. 07/26/22   Bensimhon, Shaune Pascal, MD  sacubitril-valsartan (ENTRESTO) 97-103 MG Take 1 tablet by mouth 2 (two) times daily. 06/29/22   Larey Dresser, MD  spironolactone (ALDACTONE) 25 MG tablet Take 1 tablet (25 mg total) by mouth daily. 07/26/22   Bensimhon, Shaune Pascal, MD  torsemide (DEMADEX) 20 MG tablet Take 2 tablets (40 mg total) by mouth daily. 04/19/22   Rafael Bihari, FNP    Past Medical History: Past Medical History:  Diagnosis Date   Bipolar disorder ([place])    CAD (coronary artery disease)    a. diffuse 3v CAD by cath 2019, medical therapy recommended.   Cataract    forming    Chronic systolic CHF (congestive heart failure) (HCC)    CVA (cerebral infarction)    No residual deficits   Depression    PTSD,    Diabetes mellitus    TYPE II; UNCONTROLLED BY HEMOGLOBIN A1c; STABLE AS  PER DISCHARGE   Headache(784.0)    Herpes simplex of male genitalia    History of colonic polyps     Hyperlipidemia    Hypertension    Myocardial infarction ([place]) 1987   (while playing football)   Obesity    OSA (obstructive sleep apnea)    repeat study 2018 without significant OSA   Pneumonia    Post-cardiac injury syndrome ([place])    History of cardiac injury from blunt trauma   Pulmonary hypertension ([place])    a. moderately elevated PASP 07/2019.   PVCs (premature ventricular contractions)    Schizophrenia ([place])    Goes to [facility]   Sleep apnea    Stroke [place] [name]) 2005   some left side weakness   Syncope    Recurrent, thought to be vasovagal. Also has h/o frequent PVCs.     Past Surgical History: Past Surgical History:  Procedure Laterality Date   AMPUTATION Right 01/25/2021   Procedure: RIGHT BELOW KNEE AMPUTATION;  Surgeon: [name], MD;  Location: [place];  Service: Orthopedics;  Laterality: Right;   CARDIAC CATHETERIZATION  12/19/10   DIFFUSE NONOBSTRUCTIVE CAD; NONISCHEMIC CARDIOMYOPATHY; LEFT VENTRICULAR ANGIOGRAM WAS PERFORMED SECONDARY TO  ELEVATED LEFT VENTRICULAR FILLING PRESSURES   COLONOSCOPY     ~ age [age]   COLONOSCOPY W/ POLYPECTOMY     I & D EXTREMITY Bilateral 10/24/2019   Procedure: IRRIGATION AND DEBRIDEMENT BILATERAL EXTREMITY WOUND ON FOOT;  Surgeon: Cindra Presume, MD;  Location: [place];  Service: Plastics;  Laterality: Bilateral;   ICD IMPLANT N/A 01/13/2022   Procedure: ICD IMPLANT;  Surgeon: Evans Lance, MD;  Location: [place] [facility];  Service: Cardiovascular;  Laterality: N/A;   METATARSAL HEAD EXCISION Right 08/29/2018   Procedure: METATARSAL HEAD RESECTION;  Surgeon: Edrick Kins, DPM;  Location: [place];  Service: Podiatry;  Laterality: Right;   METATARSAL OSTEOTOMY Right 08/29/2018   Procedure: SUB FIFTHE METATARSIA RIGHT FOOT;  Surgeon: Edrick Kins, DPM;  Location: [place];  Service: Podiatry;  Laterality: Right;   MULTIPLE EXTRACTIONS WITH ALVEOLOPLASTY  01/27/2014   "all my teeth; 4 Quadrants of alveoloplasty    MULTIPLE EXTRACTIONS WITH ALVEOLOPLASTY N/A 01/27/2014   Procedure: EXTRACTION OF TEETH #'1, 2, 3, 4, 5, 6, 7, 8, 9, 10, 11, 12, 13, 14, 15,  16, 17, 20, 21, 22, 23, 24, 25, 26, 27, 28, 29, 31 and 32 WITH ALVEOLOPLASTY;  Surgeon: Lenn Cal, DDS;  Location: [place];  Service: Oral Surgery;  Laterality: N/A;   ORIF FINGER / THUMB FRACTURE Right    POLYPECTOMY     RIGHT HEART CATH N/A 01/27/2021   Procedure: RIGHT HEART CATH;  Surgeon: Jolaine Artist, MD;  Location: [place] [facility];  Service: Cardiovascular;  Laterality: N/A;   RIGHT/LEFT HEART CATH AND CORONARY ANGIOGRAPHY N/A 09/26/2017   Procedure: RIGHT/LEFT HEART CATH AND CORONARY ANGIOGRAPHY;  Surgeon: Jolaine Artist, MD;  Location: [place] [facility];  Service: Cardiovascular;  Laterality: N/A;   RIGHT/LEFT HEART CATH AND CORONARY ANGIOGRAPHY N/A 04/12/2021   Procedure: RIGHT/LEFT HEART CATH AND CORONARY ANGIOGRAPHY;  Surgeon: Jolaine Artist, MD;  Location: [place] [facility];  Service: Cardiovascular;  Laterality: N/A;   SKIN SPLIT GRAFT Bilateral 10/24/2019   Procedure: SKIN GRAFT SPLIT THICKNESS LEFT THIGH;  Surgeon: Cindra Presume, MD;  Location: [place];  Service: Plastics;  Laterality: Bilateral;   WOUND DEBRIDEMENT Right 08/29/2018   Procedure: Debridement of ulcer on right fifth metatarsal;  Surgeon: Edrick Kins, DPM;  Location: [place];  Service: Podiatry;  Laterality: Right;    Family History: Family History  Problem Relation Age of Onset   Heart disease Mother        MI   Heart failure Mother    Diabetes Mother        ALSO IN MOST OF HIS SIBLINGS; 2 UNLCES HAVE ALSO PASSED AWAY FROM DM   Cardiomyopathy Mother    Cancer - Ovarian Mother    Ovarian cancer Mother    Heart disease Father    Hypertension Father    Diabetes Father    Diabetes Sister    Diabetes Brother    Colon cancer Paternal Uncle    Colon cancer Paternal Uncle    Colon polyps Neg Hx    Esophageal cancer Neg Hx    Rectal cancer Neg Hx     Stomach cancer Neg Hx     Social History: Social History   Socioeconomic History   Marital status: Married    Spouse name: Not on file   Number of children: 5   Years of education: Not on file   Highest education level: Not on file  Occupational History   Occupation: Mortician  Tobacco Use   Smoking status: Never   Smokeless tobacco: Never  Vaping Use   Vaping Use: Never used  Substance and Sexual Activity   Alcohol use: No   Drug use: No   Sexual activity: Not on file  Other Topics Concern   Not on file  Social History Narrative   MARRIED, LIVES [place]; 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   Social Determinants of Health   Financial Resource Strain: Medium Risk (04/13/2021)   Overall Financial Resource Strain (CARDIA)    Difficulty of Paying Living Expenses: Somewhat hard  Food Insecurity: No Food Insecurity (04/13/2021)   Hunger Vital Sign    Worried About Running Out of Food in the Last Year: Never true    Ran Out of Food in the Last Year: Never true  Transportation Needs: No Transportation Needs (04/13/2021)   PRAPARE - Hydrologist (Medical): No    Lack of Transportation (Non-Medical):  No  Physical Activity: Not on file  Stress: Not on file  Social Connections: Not on file    Allergies:  Allergies  Allergen Reactions   Farxiga [Dapagliflozin] Other (See Comments)    Patient reports loss of consciousness   Nsaids Anaphylaxis and Rash    Able to tolerate aspirin    Trulicity [Dulaglutide] Nausea And Vomiting    Objective:    Vital Signs:   Temp:  [97.6 F (36.4 C)-98.7 F (37.1 C)] 98.6 F (37 C) (03/26 1203) Pulse Rate:  [78-123] 83 (03/26 1203) Resp:  [10-33] 18 (03/26 1203) BP: (89-139)/(50-92) 94/65 (03/26 1203) SpO2:  [92 %-100 %] 97 % (03/26 1203) Weight:  [127.8 kg] 127.8 kg (03/25 2358)    Weight change: Filed Weights    10/23/22 1002 10/23/22 2358  Weight: 135.2 kg 127.8 kg    Intake/Output:   Intake/Output Summary (Last 24 hours) at 10/24/2022 1531 Last data filed at 10/24/2022 0600 Gross per 24 hour  Intake 913.33 ml  Output 700 ml  Net 213.33 ml      Physical Exam    General:  Well appearing,obese. No resp difficulty HEENT: normal Neck: supple. JVP not elevated . Carotids 2+ bilat; no bruits. No lymphadenopathy or thyromegaly appreciated. Cor: PMI nondisplaced. Regular rate & rhythm. No rubs, gallops or murmurs. Lungs: clear Abdomen: soft, nontender, nondistended. No hepatosplenomegaly. No bruits or masses. Good bowel sounds. Extremities: no cyanosis, clubbing, rash, edema s/p rt BKA  Neuro: alert & orientedx3, cranial nerves grossly intact. moves all 4 extremities w/o difficulty. Affect pleasant   Telemetry   NSR w/ LBBB, QRS 133 ms   EKG    NSR 80s   Labs   Basic Metabolic Panel: Recent Labs  Lab 10/21/22 1936 10/23/22 1020 10/24/22 0051  NA 140 137 137  K 4.4 4.5 4.7  CL 105 102 101  CO2 26 23 26   GLUCOSE 206* 306* 326*  BUN 32* 43* 46*  CREATININE 1.47* 2.46* 2.14*  CALCIUM 10.1 8.8* 8.3*    Liver Function Tests: Recent Labs  Lab 10/21/22 1936 10/23/22 1020  AST 17 23  ALT 11 12  ALKPHOS 70 64  BILITOT 0.3 0.5  PROT 8.0 7.2  ALBUMIN 4.2 3.6   Recent Labs  Lab 10/21/22 1936 10/23/22 1020  LIPASE 174* 36   No results for input(s): "AMMONIA" in the last 168 hours.  CBC: Recent Labs  Lab 10/21/22 1936 10/23/22 1020 10/24/22 0051  WBC 9.5 10.9* 12.3*  NEUTROABS 6.8 8.3*  --   HGB 13.4 13.6 12.3*  HCT 40.8 41.9 38.1*  MCV 90.3 91.7 91.8  PLT 158 148* 134*    Cardiac Enzymes: Recent Labs  Lab 10/23/22 2032  CKTOTAL 410*    BNP: BNP (last 3 results) Recent Labs    07/05/22 1542 10/21/22 1936 10/23/22 1002  BNP 136.0* 181.3* 48.8    ProBNP (last 3 results) No results for input(s): "PROBNP" in the last 8760 hours.   CBG: Recent  Labs  Lab 10/23/22 1704 10/23/22 2202 10/23/22 2319 10/24/22 0745 10/24/22 1201  GLUCAP 219* 410* 349* 167* 150*    Coagulation Studies: No results for input(s): "LABPROT", "INR" in the last 72 hours.   Imaging   DG Chest 1 View  Result Date: 10/24/2022 CLINICAL DATA:  CHF. EXAM: CHEST  1 VIEW COMPARISON:  10/23/2022 FINDINGS: The cardio pericardial silhouette is enlarged. The lungs are clear without focal pneumonia, edema, pneumothorax or pleural effusion. Left pacer/AICD again noted. Telemetry leads overlie  the chest. IMPRESSION: Enlargement of the cardiopericardial silhouette without acute cardiopulmonary findings. Electronically Signed   By: Misty Stanley M.D.   On: 10/24/2022 05:59     Medications:     Current Medications:  amitriptyline  10 mg Oral QHS   aspirin EC  81 mg Oral Daily   carvedilol  6.25 mg Oral BID WC   clopidogrel  75 mg Oral Daily   gabapentin  100 mg Oral BID   heparin  5,000 Units Subcutaneous Q12H   insulin aspart  0-15 Units Subcutaneous TID WC   insulin aspart  0-5 Units Subcutaneous QHS   insulin glargine-yfgn  42 Units Subcutaneous QHS   lidocaine  1 patch Transdermal Q24H   linagliptin  5 mg Oral Daily   polyethylene glycol  17 g Oral Daily   rosuvastatin  40 mg Oral Daily   senna-docusate  1 tablet Oral BID   tamsulosin  0.4 mg Oral QPC supper    Infusions:  cefTRIAXone (ROCEPHIN)  IV 2 g (10/24/22 0830)      Patient Profile   52 y/o male w/ chronic systolic heart failure due to mixed ischemic/nonischemic CM, CAD, HTN, HLD, poorly controlled 2DM and PAD admitted w/ complicated UTI. [name] course c/b hypotension and AKI.   Assessment/Plan   1. Complicated UTI - UA +, Ucx and BCx pending - on ceftriaxone per IM  - Appears to have undiagnosed BPH. Started on Flomax. Plan outpatient urology referral  - follow Bcx data (has ICD)   2. AKI - suspect 2/2 hypotensive episode + UTI  - B/l Scr ~1.3-1.5.  - SCr was 1.47 on admit and  bumped to 2.46 overnight.  Was hypotensive overnight, SBPs dropped in the 70s - continue to hold BP active meds - may need gentle IVF hydration  - avoid further hypotension - follow BMP closely   3. Chronic Systolic Heart Failure - Mixed ischemic/nonischemic cardiomyopathy  - Echo (6/22): EF 20 to 25%, stable from prior exams dating back to 49  - R/LHC (9/22): Severe 3v CAD (no targets for revascularization), mild pulm venous HTN with preserved CO. - Echo (2/23): EF 30-35%, RV normal. IVC not dilated  - He has LBBB but QRS <150 ms. Not candidate for CRT-D.  - s/p MDT ICD - NYHA II stable. Not fluid overloaded on exam. Admit BNP 48. CXR unremarkable  - continue to hold GDMT and diuretics given hypotension and AKI. Will add back slowly once issues resolves  - He had been off [place] due to nausea. Would not rechallege given recurrent UTIs    4. CAD/NSTEMI 09/22 - Last Cath with progressive 3v CAD as above. No targets for intervention.  - No s/s angina  - Continue DAPT with aspirin + plavix, statin  - hold ? blocker per above    4. Hypertension - now low, hold GDMT per above   5. Hyperlipidemia  - Continue statin.  6. DM2 - Last A1c 7.8  - SSI per primary    7. PVD s/p R BKA  - H/o Diabetic foot wound complicated by sepsis that led to amputation on 01/25/2021 - Continue aspirin and statin.     Length of Stay: 0  Lyda Jester, PA-C  10/24/2022, 3:31 PM  Advanced Heart Failure Team Pager [phone] (M-F; 7a - 5p)  Please contact [facility] for night-coverage after hours (4p -7a ) and weekends on [email or web address]

## 2022-10-24 NOTE — Progress Notes (Signed)
   10/23/22 2355  ECG Monitoring  PR interval 0.18  QRS interval 0.13  QT interval 0.4  QTc interval 0.5  CV Strip Heart Rate 97  Cardiac Rhythm NSR;BBB  Ectopy Couplet PVC's  Provider Notification  Provider Name/Title Gershon Cull NP  Date Provider Notified 10/24/22  Time Provider Notified 0040  Method of Notification Page  Notification Reason Other (Comment) (Couplet PVC's per CCMD)  Provider response No new orders  Date of Provider Response 10/24/22  Time of Provider Response [phone]

## 2022-10-24 NOTE — Progress Notes (Signed)
Breakfast tray ordered 

## 2022-10-24 NOTE — Progress Notes (Signed)
PROGRESS NOTE    Mitchell [name]  [id number] DOB: 1971/01/04 DOA: 10/23/2022 PCP: [name], MD     Brief Narrative:   chronic HFrEF with LVEF 30-35%, ischemic and nonischemic cardiomyopathy, HTN, CKD stage II, IDDM, diabetic neuropathy, OSA on CPAP, morbid obesity, presented with persistent left flank pain feeling nausea, and dysuria and hematuria .   Subjective:   He is upset that he was overdose with dilaudid and received narcan in the ED last night  He reports left side pain for several month, worse when sitting for a long time, standing up helps the pain, pain is not radiation to leg  Cloudy urine and Dysuria x5 days, is better, blood in the urine x1, no blood today,  1 bm every 2-3  Blood culture no growth Wbc increased from 10.9 to 12.3 Cr decreased from 2.46 to 2.14, ck 410 Uric acid 10.9  There is no sob, no lower extremity edema , he request heart failure team to see him while he is in the hospital    Assessment & Plan:  Principal Problem:   AKI (acute kidney injury) ([place]) Active Problems:   CAD (coronary artery disease) with flat troponin    Chronic systolic heart failure (HCC)   CKD (chronic kidney disease) stage 3, GFR 30-59 ml/min (HCC)   Obstructive sleep apnea   Obesity, Class III, BMI 40-49.9 (morbid obesity) ([place])   UTI (urinary tract infection)    Assessment and Plan:  Left side pyelonephritis ? Complicated uti Presents with blood in urine, dysuria, cloudy urine x5 days Ct  ab/pel no pancreatitis, no kidney stone Blood culture no growth  Left side flank pain, reports has been for months -appear to have muscular pain, tender with muscle spasm on left low back, pain appear to wrap around to left anterior /lower abdomen Could have left lower abdominal pain, he is pretty sensitive to light touch He denies back pain CT ab/pel no acute findings  AKI on CKD II From uti? Check bladder scan Renal dosing meds  Chronic chf Cxr no acute  findings Entresto and spironolactone held due to aki/borderline low bp Cardiology consulted    IDDM2 with hyperglycemia Adjust insulin  Left toe wound Seen by dr Sharol Given, getting betadine and triple abx ointment daily, 4/8 appointement with Dr Sharol Given Wound does not look infected, no drainage , no odor  Body mass index is 39.3 kg/m.meet obesity criteria  OSa on cpap     I have Reviewed nursing notes, Vitals, pain scores, I/o's, Lab results and  imaging results since pt's last encounter, details please see discussion above  I ordered the following labs:  Unresulted Labs (From admission, onward)     Start     Ordered   10/25/22 0500  PSA, total and free  Tomorrow morning,   R        10/24/22 1512   10/25/22 0500  CBC with Differential/Platelet  Tomorrow morning,   R        10/24/22 1543   10/25/22 [id number]  Basic metabolic panel  Tomorrow morning,   R        10/24/22 1543   10/25/22 0500  Hemoglobin A1c  Tomorrow morning,   R        10/24/22 1642   10/23/22 1417  Urine Culture  Once,   URGENT       Question Answer Comment  Indication Dysuria   Patient immune status Normal   Release to patient Immediate  10/23/22 1416             DVT prophylaxis: heparin injection 5,000 Units Start: 10/23/22 2200   Code Status:   Code Status: Full Code  Family Communication: patient at bedside ,wife over the phone Disposition:   Dispo: The patient is from: home              Anticipated d/c is to: home              Anticipated d/c date is: 24-48hrs, pending cr improvement, cardiology input  Antimicrobials:    Anti-infectives (From admission, onward)    Start     Dose/Rate Route Frequency Ordered Stop   10/24/22 1000  cefTRIAXone (ROCEPHIN) 1 g in sodium chloride 0.9 % 100 mL IVPB  Status:  Discontinued        1 g 200 mL/hr over 30 Minutes Intravenous Every 24 hours 10/23/22 1759 10/23/22 1830   10/24/22 1000  cefTRIAXone (ROCEPHIN) 2 g in sodium chloride 0.9 % 100 mL IVPB         2 g 200 mL/hr over 30 Minutes Intravenous Every 24 hours 10/23/22 1830     10/23/22 1845  cefTRIAXone (ROCEPHIN) 1 g in sodium chloride 0.9 % 100 mL IVPB       Note to Pharmacy: To make total dose of 2 gm today. His BMI=41 and has pyelonephritis   1 g 200 mL/hr over 30 Minutes Intravenous Every 24 hours 10/23/22 1831 10/23/22 2038   10/23/22 1500  cefTRIAXone (ROCEPHIN) 1 g in sodium chloride 0.9 % 100 mL IVPB        1 g 200 mL/hr over 30 Minutes Intravenous  Once 10/23/22 1455 10/23/22 1649          Objective: Vitals:   10/24/22 0604 10/24/22 0815 10/24/22 1203 10/24/22 1633  BP: (!) 95/50 97/70 94/65  114/71  Pulse: 93 90 83 90  Resp: 16 18 18 18   Temp: 97.8 F (36.6 C) 98.7 F (37.1 C) 98.6 F (37 C) 97.6 F (36.4 C)  TempSrc: Oral Oral Oral Oral  SpO2: 99% 97% 97% 100%  Weight:      Height:        Intake/Output Summary (Last 24 hours) at 10/24/2022 1644 Last data filed at 10/24/2022 1629 Gross per 24 hour  Intake 1013.33 ml  Output 1300 ml  Net -286.67 ml   Filed Weights   10/23/22 1002 10/23/22 2358  Weight: 135.2 kg 127.8 kg    Examination:  General exam: alert, awake, communicative,calm, NAD Respiratory system: Clear to auscultation. Respiratory effort normal. Cardiovascular system:  RRR.  Gastrointestinal system: Abdomen is nondistended, soft and nontender.  Normal bowel sounds heard. Central nervous system: Alert and oriented. No focal neurological deficits. Extremities:  no edema, h/o left kba Skin: No rashes, lesions or ulcers Psychiatry: Judgement and insight appear normal. Mood & affect appropriate.     Data Reviewed: I have personally reviewed  labs and visualized  imaging studies since the last encounter and formulate the plan        Scheduled Meds:  amitriptyline  10 mg Oral QHS   aspirin EC  81 mg Oral Daily   carvedilol  6.25 mg Oral BID WC   clopidogrel  75 mg Oral Daily   gabapentin  100 mg Oral BID   heparin  5,000 Units  Subcutaneous Q12H   insulin aspart  0-15 Units Subcutaneous TID WC   insulin aspart  0-5 Units Subcutaneous QHS   insulin glargine-yfgn  42 Units Subcutaneous QHS   lidocaine  1 patch Transdermal Q24H   linagliptin  5 mg Oral Daily   polyethylene glycol  17 g Oral Daily   rosuvastatin  40 mg Oral Daily   senna-docusate  1 tablet Oral BID   tamsulosin  0.4 mg Oral QPC supper   Continuous Infusions:  cefTRIAXone (ROCEPHIN)  IV 2 g (10/24/22 0830)     LOS: 0 days   Time spent: 49mins  Florencia Reasons, MD PhD FACP [facility]  Available via Epic secure chat 7am-7pm for nonurgent issues Please page for urgent issues To page the attending provider between 7A-7P or the covering provider during after hours 7P-7A, please log into the web site [email or web address] and access using universal [place] password for that web site. If you do not have the password, please call the hospital operator.    10/24/2022, 4:44 PM

## 2022-10-24 NOTE — Progress Notes (Signed)
Pt refusing CPAP therapy for bedtime. Pt expressed he does not want a CPAP unit in the room. No distress on RA. RT to follow.

## 2022-10-25 DIAGNOSIS — I5022 Chronic systolic (congestive) heart failure: Secondary | ICD-10-CM | POA: Diagnosis not present

## 2022-10-25 DIAGNOSIS — N179 Acute kidney failure, unspecified: Secondary | ICD-10-CM | POA: Diagnosis not present

## 2022-10-25 LAB — BASIC METABOLIC PANEL
Anion gap: 9 (ref 5–15)
BUN: 39 mg/dL — ABNORMAL HIGH (ref 6–20)
CO2: 24 mmol/L (ref 22–32)
Calcium: 8.7 mg/dL — ABNORMAL LOW (ref 8.9–10.3)
Chloride: 106 mmol/L (ref 98–111)
Creatinine, Ser: 1.59 mg/dL — ABNORMAL HIGH (ref 0.61–1.24)
GFR, Estimated: 51 mL/min — ABNORMAL LOW (ref >60)
Glucose, Bld: 181 mg/dL — ABNORMAL HIGH (ref 70–99)
Potassium: 4 mmol/L (ref 3.5–5.1)
Sodium: 139 mmol/L (ref 135–145)

## 2022-10-25 LAB — CBC WITH DIFFERENTIAL/PLATELET
Abs Immature Granulocytes: 0.04 10*3/uL (ref 0.00–0.07)
Basophils Absolute: 0 10*3/uL (ref 0.0–0.1)
Basophils Relative: 1 %
Eosinophils Absolute: 0.1 10*3/uL (ref 0.0–0.5)
Eosinophils Relative: 2 %
HCT: 35.7 % — ABNORMAL LOW (ref 39.0–52.0)
Hemoglobin: 11.9 g/dL — ABNORMAL LOW (ref 13.0–17.0)
Immature Granulocytes: 1 %
Lymphocytes Relative: 23 %
Lymphs Abs: 1.6 10*3/uL (ref 0.7–4.0)
MCH: 30.2 pg (ref 26.0–34.0)
MCHC: 33.3 g/dL (ref 30.0–36.0)
MCV: 90.6 fL (ref 80.0–100.0)
Monocytes Absolute: 0.5 10*3/uL (ref 0.1–1.0)
Monocytes Relative: 8 %
Neutro Abs: 4.5 10*3/uL (ref 1.7–7.7)
Neutrophils Relative %: 65 %
Platelets: 125 10*3/uL — ABNORMAL LOW (ref 150–400)
RBC: 3.94 MIL/uL — ABNORMAL LOW (ref 4.22–5.81)
RDW: 12 % (ref 11.5–15.5)
WBC: 6.8 10*3/uL (ref 4.0–10.5)
nRBC: 0 % (ref 0.0–0.2)

## 2022-10-25 LAB — GLUCOSE, CAPILLARY
Glucose-Capillary: 198 mg/dL — ABNORMAL HIGH (ref 70–99)
Glucose-Capillary: 228 mg/dL — ABNORMAL HIGH (ref 70–99)

## 2022-10-25 MED ORDER — LIDOCAINE 5 % EX PTCH
1.0000 | MEDICATED_PATCH | CUTANEOUS | Status: DC
  Administered 2022-10-26: 1 via TRANSDERMAL
  Filled 2022-10-25: qty 1

## 2022-10-25 NOTE — Progress Notes (Signed)
Pt refusing CPAP therapy for bedtime. Pt expressed he does not want a CPAP unit in the room. No distress on RA. RT to follow.

## 2022-10-25 NOTE — Progress Notes (Signed)
PROGRESS NOTE    Mitchell [name]  [id number] DOB: Jul 04, 1971 DOA: 10/23/2022 PCP: [name], MD     Brief Narrative:   chronic HFrEF with LVEF 30-35%, ischemic and nonischemic cardiomyopathy, s/p icd placement in 12/2021, HTN, CKD stage II, IDDM, diabetic neuropathy, OSA on CPAP, morbid obesity, presented with persistent left flank pain feeling nausea, and dysuria and hematuria .   Subjective:   States he is happy that the heart doctor team came to see him, he said one of the heart doctor said they would check to make sure his ICD is not infected   There is no sob, no lower extremity edema    Bladder scan around 230cc yesterday pm, Reports urine is clearing up,  Had bm today  Continue to reports left side pain, he is observed to have tendency to lay on left side   Blood pressure has improved  Assessment & Plan:  Principal Problem:   AKI (acute kidney injury) ([place]) Active Problems:   CAD (coronary artery disease) with flat troponin    Chronic systolic heart failure (HCC)   CKD (chronic kidney disease) stage 3, GFR 30-59 ml/min (HCC)   Obstructive sleep apnea   Obesity, Class III, BMI 40-49.9 (morbid obesity) ([place])   UTI (urinary tract infection)    Assessment and Plan:  Left side pyelonephritis ? Complicated uti Presents with dysuria, cloudy urine x5 days, blood in urinex1,  Ct  ab/pel no pancreatitis, no kidney stone Blood culture no growth Appear to have brief hypotension initially on presentation, lowest per record was 75/66 Wbc increased from 10.9 to 12.3  to normal at 6.8 today Cr decreased from 2.46 to 2.14 to 1.59,    Left side flank pain, reports has been for months,He denies back pain -He reports left side pain for several month, worse when sitting for a long time, standing up helps the pain, pain is not radiation to leg -per chart review, he received dilaudid in the ED, has hypotension, patient states "it knock me out", " they had to give me  narcan" -appear to have muscular pain, tender with muscle spasm on left flank pain appear to wrap around to left anterior /lower abdomen -pretty sensitive to light touch, skin appear to be hard, no cellulitis, could be local fluids accumulation in the skin fold -CT ab/pel no acute findings -mild ck elevation at 400, repeat ck Topical lidocain  AKI on CKD II From uti? Also has brief hypotension  improving Renal dosing meds  Urinary retention? Neurogenic bladder from diabetes? ct ab/pel prostate unremarkable, psa pending bladder scan 230cc,  Started flomax Outpatient follow up with urology  Chronic systolic chf, s/p ICD placement  Cxr no acute findings Entresto and spironolactone held due to aki/borderline low bp Cardiology consulted , management  per cardiology   IDDM2 with hyperglycemia Adjust insulin prn, am blood glucose is 181  Left toe wound Seen by dr Sharol Given, getting betadine and triple abx ointment daily, 4/8 appointement with Dr Sharol Given Wound does not look infected, no drainage , no odor  Elevated uric acid at 10.9 From diuretics? Does not appear to have symptom Will discuss with patient regarding low does allopurinol   Body mass index is 39.3 kg/m.meet obesity criteria  OSa on cpap     I have Reviewed nursing notes, Vitals, pain scores, I/o's, Lab results and  imaging results since pt's last encounter, details please see discussion above  I ordered the following labs:  Unresulted Labs (From admission, onward)  Start     Ordered   10/26/22 0500  CK  Tomorrow morning,   R        10/25/22 1323   10/26/22 0500  CBC with Differential/Platelet  Tomorrow morning,   R        10/25/22 1332   10/26/22 [id number]  Basic metabolic panel  Tomorrow morning,   R        10/25/22 1332   10/25/22 0500  PSA, total and free  Tomorrow morning,   R        10/24/22 1512   10/25/22 0500  Hemoglobin A1c  Tomorrow morning,   R        10/24/22 1642   10/23/22 1417  Urine Culture  Once,    URGENT       Question Answer Comment  Indication Dysuria   Patient immune status Normal   Release to patient Immediate      10/23/22 1416             DVT prophylaxis: heparin injection 5,000 Units Start: 10/23/22 2200   Code Status:   Code Status: Full Code  Family Communication: patient  Disposition:   Dispo: The patient is from: home              Anticipated d/c is to: home              Anticipated d/c date is: 24-48hrs, pending cr improvement, cardiology clearance   Antimicrobials:    Anti-infectives (From admission, onward)    Start     Dose/Rate Route Frequency Ordered Stop   10/24/22 1000  cefTRIAXone (ROCEPHIN) 1 g in sodium chloride 0.9 % 100 mL IVPB  Status:  Discontinued        1 g 200 mL/hr over 30 Minutes Intravenous Every 24 hours 10/23/22 1759 10/23/22 1830   10/24/22 1000  cefTRIAXone (ROCEPHIN) 2 g in sodium chloride 0.9 % 100 mL IVPB        2 g 200 mL/hr over 30 Minutes Intravenous Every 24 hours 10/23/22 1830     10/23/22 1845  cefTRIAXone (ROCEPHIN) 1 g in sodium chloride 0.9 % 100 mL IVPB       Note to Pharmacy: To make total dose of 2 gm today. His BMI=41 and has pyelonephritis   1 g 200 mL/hr over 30 Minutes Intravenous Every 24 hours 10/23/22 1831 10/23/22 2038   10/23/22 1500  cefTRIAXone (ROCEPHIN) 1 g in sodium chloride 0.9 % 100 mL IVPB        1 g 200 mL/hr over 30 Minutes Intravenous  Once 10/23/22 1455 10/23/22 1649          Objective: Vitals:   10/24/22 2210 10/25/22 0354 10/25/22 0424 10/25/22 0812  BP:  (!) 86/56 96/66 125/75  Pulse: 88 79  84  Resp: 18 15  17   Temp:  97.7 F (36.5 C)  97.6 F (36.4 C)  TempSrc:  Oral  Oral  SpO2: 97% 100%  99%  Weight:      Height:        Intake/Output Summary (Last 24 hours) at 10/25/2022 1334 Last data filed at 10/25/2022 0353 Gross per 24 hour  Intake 100 ml  Output 1000 ml  Net -900 ml   Filed Weights   10/23/22 1002 10/23/22 2358  Weight: 135.2 kg 127.8 kg     Examination:  General exam: alert, awake, communicative,calm, NAD Respiratory system: Clear to auscultation. Respiratory effort normal. Cardiovascular system:  RRR.  Gastrointestinal system:  Abdomen is nondistended, soft and nontender.  Normal bowel sounds heard. Central nervous system: Alert and oriented. No focal neurological deficits. Extremities:  no edema, h/o left kba Skin: left flank skin fold is tight and tender to touch, no sign of infection Psychiatry: Judgement and insight appear normal. Mood & affect appropriate.     Data Reviewed: I have personally reviewed  labs and visualized  imaging studies since the last encounter and formulate the plan        Scheduled Meds:  amitriptyline  10 mg Oral QHS   aspirin EC  81 mg Oral Daily   carvedilol  6.25 mg Oral BID WC   clopidogrel  75 mg Oral Daily   gabapentin  100 mg Oral BID   heparin  5,000 Units Subcutaneous Q12H   insulin aspart  0-15 Units Subcutaneous TID WC   insulin aspart  0-5 Units Subcutaneous QHS   insulin glargine-yfgn  42 Units Subcutaneous QHS   lidocaine  1 patch Transdermal Q24H   linagliptin  5 mg Oral Daily   polyethylene glycol  17 g Oral Daily   rosuvastatin  40 mg Oral Daily   senna-docusate  1 tablet Oral BID   tamsulosin  0.4 mg Oral QPC supper   Continuous Infusions:  cefTRIAXone (ROCEPHIN)  IV 2 g (10/25/22 0835)     LOS: 1 day   Time spent: 80mins  Florencia Reasons, MD PhD FACP [facility]  Available via Epic secure chat 7am-7pm for nonurgent issues Please page for urgent issues To page the attending provider between 7A-7P or the covering provider during after hours 7P-7A, please log into the web site [email or web address] and access using universal [place] password for that web site. If you do not have the password, please call the hospital operator.    10/25/2022, 1:34 PM

## 2022-10-25 NOTE — Plan of Care (Signed)

## 2022-10-25 NOTE — Progress Notes (Addendum)
Advanced Heart Failure Rounding Note  PCP-Cardiologist: Glori Bickers, MD   Subjective:    Feels ok. Denies SOB.     Objective:   Weight Range: 127.8 kg Body mass index is 39.3 kg/m.   Vital Signs:   Temp:  [97.4 F (36.3 C)-98.6 F (37 C)] 97.6 F (36.4 C) (03/27 0812) Pulse Rate:  [79-90] 84 (03/27 0812) Resp:  [15-18] 17 (03/27 0812) BP: (86-125)/(56-75) 125/75 (03/27 0812) SpO2:  [97 %-100 %] 99 % (03/27 0812)    Weight change: Filed Weights   10/23/22 1002 10/23/22 2358  Weight: 135.2 kg 127.8 kg    Intake/Output:   Intake/Output Summary (Last 24 hours) at 10/25/2022 0934 Last data filed at 10/25/2022 0353 Gross per 24 hour  Intake 100 ml  Output 1000 ml  Net -900 ml      Physical Exam    General:  In bed. No resp difficulty HEENT: Normal Neck: Supple. JVP 5-6 Carotids 2+ bilat; no bruits. No lymphadenopathy or thyromegaly appreciated. Cor: PMI nondisplaced. Regular rate & rhythm. No rubs, gallops or murmurs. Lungs: Clear Abdomen: Soft, nontender, nondistended. No hepatosplenomegaly. No bruits or masses. Good bowel sounds. Extremities: No cyanosis, clubbing, rash, edema. R BKA  Neuro: Alert & orientedx3, cranial nerves grossly intact. moves all 4 extremities w/o difficulty. Affect pleasant   Telemetry   SR 70-80s   EKG      Labs    CBC Recent Labs    10/23/22 1020 10/24/22 0051 10/25/22 0057  WBC 10.9* 12.3* 6.8  NEUTROABS 8.3*  --  4.5  HGB 13.6 12.3* 11.9*  HCT 41.9 38.1* 35.7*  MCV 91.7 91.8 90.6  PLT 148* 134* 0000000*   Basic Metabolic Panel Recent Labs    10/24/22 0051 10/25/22 0057  NA 137 139  K 4.7 4.0  CL 101 106  CO2 26 24  GLUCOSE 326* 181*  BUN 46* 39*  CREATININE 2.14* 1.59*  CALCIUM 8.3* 8.7*   Liver Function Tests Recent Labs    10/23/22 1020  AST 23  ALT 12  ALKPHOS 64  BILITOT 0.5  PROT 7.2  ALBUMIN 3.6   Recent Labs    10/23/22 1020  LIPASE 36   Cardiac Enzymes Recent Labs     10/23/22 2032  CKTOTAL 410*    BNP: BNP (last 3 results) Recent Labs    07/05/22 1542 10/21/22 1936 10/23/22 1002  BNP 136.0* 181.3* 48.8    ProBNP (last 3 results) No results for input(s): "PROBNP" in the last 8760 hours.   D-Dimer No results for input(s): "DDIMER" in the last 72 hours. Hemoglobin A1C No results for input(s): "HGBA1C" in the last 72 hours. Fasting Lipid Panel No results for input(s): "CHOL", "HDL", "LDLCALC", "TRIG", "CHOLHDL", "LDLDIRECT" in the last 72 hours. Thyroid Function Tests No results for input(s): "TSH", "T4TOTAL", "T3FREE", "THYROIDAB" in the last 72 hours.  Invalid input(s): "FREET3"  Other results:   Imaging    No results found.   Medications:     Scheduled Medications:  amitriptyline  10 mg Oral QHS   aspirin EC  81 mg Oral Daily   carvedilol  6.25 mg Oral BID WC   clopidogrel  75 mg Oral Daily   gabapentin  100 mg Oral BID   heparin  5,000 Units Subcutaneous Q12H   insulin aspart  0-15 Units Subcutaneous TID WC   insulin aspart  0-5 Units Subcutaneous QHS   insulin glargine-yfgn  42 Units Subcutaneous QHS   lidocaine  1  patch Transdermal Q24H   linagliptin  5 mg Oral Daily   polyethylene glycol  17 g Oral Daily   rosuvastatin  40 mg Oral Daily   senna-docusate  1 tablet Oral BID   tamsulosin  0.4 mg Oral QPC supper    Infusions:  cefTRIAXone (ROCEPHIN)  IV 2 g (10/25/22 0835)    PRN Medications: acetaminophen-codeine, HYDROmorphone (DILAUDID) injection, methocarbamol, oxyCODONE, pantoprazole    Patient Profile   53 YO w/ chronic systolic heart failure ( LV EF 35% in 2023 ), CKD, obesity, OSA on CPAP currently admitted after presenting with severe left flank pain, dysuria and hematuria secondary to likely complicated UTI.   Assessment/Plan  1. Complicated UTI - UA +, Ucx  - Bld CX - NGTD x 2 days. Follow Bld CX with ICD - WBC trending down.  - on ceftriaxone per IM  - Appears to have undiagnosed BPH.  Started on Flomax. Plan outpatient urology referral    2. AKI - suspect 2/2 hypotensive episode + UTI  - B/l Scr ~1.3-1.5.  - SCr peaked  2.46 overnight--> improved 1.6 .  - avoid further hypotension - follow BMP closely    3. Chronic Systolic Heart Failure - Mixed ischemic/nonischemic cardiomyopathy  - Echo (6/22): EF 20 to 25%, stable from prior exams dating back to 30  - R/LHC (9/22): Severe 3v CAD (no targets for revascularization), mild pulm venous HTN with preserved CO. - Echo (2/23): EF 30-35%, RV normal. IVC not dilated  - He has LBBB but QRS <150 ms. Not candidate for CRT-D.  - s/p MDT ICD - NYHA II stable. Not fluid overloaded on exam. Admit BNP 48. CXR unremarkable  - Appears euvolemic. No diuretics.  - continue coreg 6.25 mg twice a day   - Had low BP overnight. Hold off on entresto and spiro.  - Keep off SGLT2i recurrent UTIs      4. CAD/NSTEMI 09/22 - Last Cath with progressive 3v CAD as above. No targets for intervention.  - No s/s angina  - Continue DAPT with aspirin + plavix, statin  -Ok to continue coreg.    4. Hypertension - As above. BP soft. Holding spiro and entresto    5. Hyperlipidemia  - Continue statin.   6. DM2 - Last A1c 7.8  - SSI per primary    7. PVD s/p R BKA  - H/o Diabetic foot wound complicated by sepsis that led to amputation on 01/25/2021 - Continue aspirin and statin.   Length of Stay: 1  Mitchell Hoot, NP  10/25/2022, 9:34 AM  Advanced Heart Failure Team Pager [phone] (M-F; 7a - 5p)  Please contact [facility] for night-coverage after hours (5p -7a ) and weekends on [email or web address]

## 2022-10-25 NOTE — Consult Note (Signed)
WOC Nurse Consult Note: Reason for Consult:Left great toe and left second digit healing full thickness wounds. Followed by both Drs. Sharol Given (orthopedics) and Amalia Hailey (podiatric medicine) in the community. I will implement the POC from those providers to keep lesions lean and dry Wound type:neuropathic Pressure Injury POA: N/A Measurement:To be documented on Nursing flow sheet by Bedside RN Wound bed:red, dry Drainage (amount, consistency, odor) scant serous Periwound: intact Dressing procedure/placement/frequency: I have provided Nursing with conservative care guidance of rhte care of these lesions using a daily soap and water cleanse followed by painting with povidone-iodine solution and allowing it to dry. After this, lesions are to be topped with dry gauze and secured with conform. Patient has received guidance for obtaining shoes with a high and wide toe box post discharge from both providers listed above.  [place] nursing team will not follow, but will remain available to this patient, the nursing and medical teams.  Please re-consult if needed.  Thank you for inviting [place] to participate in this patient's Plan of Care.  Maudie Flakes, MSN, RN, CNS, [place], Serita Grammes, [employer], [employer] phone:  [phone]

## 2022-10-26 ENCOUNTER — Inpatient Hospital Stay (HOSPITAL_COMMUNITY): Payer: No Typology Code available for payment source

## 2022-10-26 DIAGNOSIS — I5022 Chronic systolic (congestive) heart failure: Secondary | ICD-10-CM | POA: Diagnosis not present

## 2022-10-26 DIAGNOSIS — N179 Acute kidney failure, unspecified: Secondary | ICD-10-CM | POA: Diagnosis not present

## 2022-10-26 LAB — CBC WITH DIFFERENTIAL/PLATELET
Abs Immature Granulocytes: 0.03 10*3/uL (ref 0.00–0.07)
Basophils Absolute: 0 10*3/uL (ref 0.0–0.1)
Basophils Relative: 0 %
Eosinophils Absolute: 0.2 10*3/uL (ref 0.0–0.5)
Eosinophils Relative: 2 %
HCT: 34.6 % — ABNORMAL LOW (ref 39.0–52.0)
Hemoglobin: 11.6 g/dL — ABNORMAL LOW (ref 13.0–17.0)
Immature Granulocytes: 0 %
Lymphocytes Relative: 17 %
Lymphs Abs: 1.4 10*3/uL (ref 0.7–4.0)
MCH: 30.4 pg (ref 26.0–34.0)
MCHC: 33.5 g/dL (ref 30.0–36.0)
MCV: 90.8 fL (ref 80.0–100.0)
Monocytes Absolute: 0.5 10*3/uL (ref 0.1–1.0)
Monocytes Relative: 7 %
Neutro Abs: 5.8 10*3/uL (ref 1.7–7.7)
Neutrophils Relative %: 74 %
Platelets: 128 10*3/uL — ABNORMAL LOW (ref 150–400)
RBC: 3.81 MIL/uL — ABNORMAL LOW (ref 4.22–5.81)
RDW: 12 % (ref 11.5–15.5)
WBC: 7.9 10*3/uL (ref 4.0–10.5)
nRBC: 0 % (ref 0.0–0.2)

## 2022-10-26 LAB — PSA, TOTAL AND FREE
PSA, Free Pct: 23.6 %
PSA, Free: 0.33 ng/mL
Prostate Specific Ag, Serum: 1.4 ng/mL (ref 0.0–4.0)

## 2022-10-26 LAB — BASIC METABOLIC PANEL
Anion gap: 9 (ref 5–15)
BUN: 36 mg/dL — ABNORMAL HIGH (ref 6–20)
CO2: 26 mmol/L (ref 22–32)
Calcium: 8.8 mg/dL — ABNORMAL LOW (ref 8.9–10.3)
Chloride: 103 mmol/L (ref 98–111)
Creatinine, Ser: 1.61 mg/dL — ABNORMAL HIGH (ref 0.61–1.24)
GFR, Estimated: 51 mL/min — ABNORMAL LOW (ref >60)
Glucose, Bld: 155 mg/dL — ABNORMAL HIGH (ref 70–99)
Potassium: 4.2 mmol/L (ref 3.5–5.1)
Sodium: 138 mmol/L (ref 135–145)

## 2022-10-26 LAB — GLUCOSE, CAPILLARY
Glucose-Capillary: 176 mg/dL — ABNORMAL HIGH (ref 70–99)
Glucose-Capillary: 195 mg/dL — ABNORMAL HIGH (ref 70–99)

## 2022-10-26 LAB — HEMOGLOBIN A1C
Hgb A1c MFr Bld: 10.2 % — ABNORMAL HIGH (ref 4.8–5.6)
Mean Plasma Glucose: 246 mg/dL

## 2022-10-26 LAB — CK: Total CK: 253 U/L (ref 49–397)

## 2022-10-26 MED ORDER — SPIRONOLACTONE 12.5 MG HALF TABLET
12.5000 mg | ORAL_TABLET | Freq: Every day | ORAL | Status: DC
  Administered 2022-10-26: 12.5 mg via ORAL
  Filled 2022-10-26 (×3): qty 1

## 2022-10-26 MED ORDER — POLYETHYLENE GLYCOL 3350 17 G PO PACK
17.0000 g | PACK | Freq: Two times a day (BID) | ORAL | Status: DC
  Administered 2022-10-26: 17 g via ORAL
  Filled 2022-10-26 (×2): qty 1

## 2022-10-26 NOTE — Progress Notes (Signed)
   10/26/22 1500  Mobility  Activity Refused mobility   Mobility Specialist Progress Note  Pt refused mobility d/t not feeling like it. Will f/u as able.      Lucious Groves Mobility Specialist  Please contact via SecureChat or Rehab office at [phone]

## 2022-10-26 NOTE — Progress Notes (Signed)
Patient refusing cpap at this time

## 2022-10-26 NOTE — TOC Initial Note (Signed)
Transition of Care [facility]) - Initial/Assessment Note    Patient Details  Name: Mitchell [name] MRN: [id number] Date of Birth: 1971/03/06  Transition of Care [facility]) CM/SW Contact:    Erenest Rasher, RN Phone Number: [phone] 10/26/2022, 4:35 PM  Clinical Narrative:                  Spoke to pt and independent PTA. Pt states he still works and will need a FMLA paperwork completed for his job. He has a scale for daily weights.     Expected Discharge Plan: Home/Self Care Barriers to Discharge: Continued Medical Work up   Patient Goals and CMS Choice Patient states their goals for this hospitalization and ongoing recovery are:: wants to feel better          Expected Discharge Plan and Services       Living arrangements for the past 2 months: Single Family Home                                      Prior Living Arrangements/Services Living arrangements for the past 2 months: Single Family Home Lives with:: Spouse Patient language and need for interpreter reviewed:: Yes        Need for Family Participation in Patient Care: No (Comment) Care giver support system in place?: Yes (comment) Current home services: DME (scale) Criminal Activity/Legal Involvement Pertinent to Current Situation/Hospitalization: No - Comment as needed  Activities of Daily Living Home Assistive Devices/Equipment: Cane (specify quad or straight) ADL Screening (condition at time of admission) Patient's cognitive ability adequate to safely complete daily activities?: Yes Is the patient deaf or have difficulty hearing?: No Does the patient have difficulty seeing, even when wearing glasses/contacts?: Yes Does the patient have difficulty concentrating, remembering, or making decisions?: Yes Patient able to express need for assistance with ADLs?: Yes Does the patient have difficulty dressing or bathing?: Yes Independently performs ADLs?: Yes (appropriate for developmental age) Does the  patient have difficulty walking or climbing stairs?: Yes Weakness of Legs: Both  Permission Sought/Granted Permission sought to share information with : Family Supports, PCP, Case Manager Permission granted to share information with : Yes, Verbal Permission Granted  Share Information with NAME: Edwena Blow     Permission granted to share info w Relationship: wife  Permission granted to share info w Contact Information: [phone]  Emotional Assessment              Admission diagnosis:  AKI (acute kidney injury) ([place]) [N17.9] Opioid overdose, accidental or unintentional, initial encounter ([place]) [T40.2X1A] UTI (urinary tract infection) [N39.0] Patient Active Problem List   Diagnosis Date Noted   UTI (urinary tract infection) 10/23/2022   ICD (implantable cardioverter-defibrillator) in place 04/19/2022   Near syncope 12/08/2021   CKD (chronic kidney disease) stage 3, GFR 30-59 ml/min ([place]) 12/08/2021   PVD s/p R LE amputation  12/08/2021   Diabetic retinopathy associated with type 2 diabetes mellitus ([place]) 06/06/2021   CAD (coronary artery disease) with flat troponin  04/11/2021   S/P below knee amputation, right ([place]) 02/07/2021   AKI (acute kidney injury) ([place]) 01/23/2021   QT prolongation 07/30/2019   Acute on chronic combined systolic and diastolic CHF (congestive heart failure) ([place]) 07/29/2019   Stroke ([place]) 05/08/2017   Uncontrolled type 2 diabetes mellitus with hyperglycemia, with long-term current use of insulin ([place]) [id number]   Chronic systolic heart failure ([place])  10/02/2011   Erectile dysfunction 10/02/2011   Obstructive sleep apnea 10/11/2007   HLD (hyperlipidemia) 10/10/2007   HYPOKALEMIA 10/10/2007   Obesity, Class III, BMI 40-49.9 (morbid obesity) ([place]) 10/10/2007   Essential hypertension 10/10/2007   PREMATURE VENTRICULAR CONTRACTIONS 10/10/2007   COLONIC POLYPS, HX OF 10/10/2007   PCP:  [name], MD Pharmacy:   [facility] [address], [place] - [place] [address] [place] [place] [address] Phone: [phone] Fax: [phone]     Social Determinants of Health (SDOH) Social History: SDOH Screenings   Food Insecurity: No Food Insecurity (04/13/2021)  Housing: Low Risk  (04/13/2021)  Transportation Needs: No Transportation Needs (04/13/2021)  Alcohol Screen: Low Risk  (05/15/2018)  Depression (PHQ2-9): Low Risk  (07/26/2022)  Financial Resource Strain: Medium Risk (04/13/2021)  Tobacco Use: Low Risk  (10/23/2022)   SDOH Interventions:     Readmission Risk Interventions     No data to display

## 2022-10-26 NOTE — Progress Notes (Signed)
Advanced Heart Failure Rounding Note  PCP-Cardiologist: Glori Bickers, MD   Subjective:    Resting comfortable in bed. Denies CP/SOB. Ambulated around whole unit this morning, no complaints.   Objective:   Weight Range: 127.8 kg Body mass index is 39.3 kg/m.   Vital Signs:   Temp:  [97.9 F (36.6 C)-98.5 F (36.9 C)] 98.3 F (36.8 C) (03/28 0801) Pulse Rate:  [77-88] 77 (03/28 0801) Resp:  [17-18] 18 (03/28 0801) BP: (111-131)/(66-79) 131/79 (03/28 0801) SpO2:  [95 %-100 %] 95 % (03/28 0801) Last BM Date : 10/25/22  Weight change: Filed Weights   10/23/22 1002 10/23/22 2358  Weight: 135.2 kg 127.8 kg    Intake/Output:   Intake/Output Summary (Last 24 hours) at 10/26/2022 1118 Last data filed at 10/26/2022 1034 Gross per 24 hour  Intake 1300 ml  Output 2475 ml  Net -1175 ml    Physical Exam   General:  well appearing.  No respiratory difficulty HEENT: normal Neck: supple. JVD 6 cm. Carotids 2+ bilat; no bruits. No lymphadenopathy or thyromegaly appreciated. Cor: PMI nondisplaced. Regular rate & rhythm. No rubs, gallops or murmurs. Lungs: clear Abdomen: soft, nontender, nondistended. No hepatosplenomegaly. No bruits or masses. Good bowel sounds. Extremities: no cyanosis, clubbing, rash, edema . R BKA Neuro: alert & oriented x 3, cranial nerves grossly intact. moves all 4 extremities w/o difficulty. Affect pleasant.  Telemetry   NSR 90s with occasional PACs (Personally reviewed)   EKG    No new EKG to review  Labs    CBC Recent Labs    10/25/22 0057 10/26/22 0317  WBC 6.8 7.9  NEUTROABS 4.5 5.8  HGB 11.9* 11.6*  HCT 35.7* 34.6*  MCV 90.6 90.8  PLT 125* 0000000*   Basic Metabolic Panel Recent Labs    10/25/22 0057 10/26/22 0317  NA 139 138  K 4.0 4.2  CL 106 103  CO2 24 26  GLUCOSE 181* 155*  BUN 39* 36*  CREATININE 1.59* 1.61*  CALCIUM 8.7* 8.8*   Liver Function Tests No results for input(s): "AST", "ALT", "ALKPHOS", "BILITOT",  "PROT", "ALBUMIN" in the last 72 hours.  No results for input(s): "LIPASE", "AMYLASE" in the last 72 hours.  Cardiac Enzymes Recent Labs    10/23/22 2032 10/26/22 0317  CKTOTAL 410* 253    BNP: BNP (last 3 results) Recent Labs    07/05/22 1542 10/21/22 1936 10/23/22 1002  BNP 136.0* 181.3* 48.8    ProBNP (last 3 results) No results for input(s): "PROBNP" in the last 8760 hours.   D-Dimer No results for input(s): "DDIMER" in the last 72 hours. Hemoglobin A1C Recent Labs    10/25/22 0057  HGBA1C 10.2*   Fasting Lipid Panel No results for input(s): "CHOL", "HDL", "LDLCALC", "TRIG", "CHOLHDL", "LDLDIRECT" in the last 72 hours. Thyroid Function Tests No results for input(s): "TSH", "T4TOTAL", "T3FREE", "THYROIDAB" in the last 72 hours.  Invalid input(s): "FREET3"  Other results:   Imaging   No results found.  Medications:     Scheduled Medications:  amitriptyline  10 mg Oral QHS   aspirin EC  81 mg Oral Daily   carvedilol  6.25 mg Oral BID WC   clopidogrel  75 mg Oral Daily   gabapentin  100 mg Oral BID   heparin  5,000 Units Subcutaneous Q12H   insulin aspart  0-15 Units Subcutaneous TID WC   insulin aspart  0-5 Units Subcutaneous QHS   insulin glargine-yfgn  42 Units Subcutaneous QHS   lidocaine  1 patch Transdermal Q24H   lidocaine  1 patch Transdermal Q24H   linagliptin  5 mg Oral Daily   polyethylene glycol  17 g Oral Daily   rosuvastatin  40 mg Oral Daily   senna-docusate  1 tablet Oral BID   tamsulosin  0.4 mg Oral QPC supper    Infusions:  cefTRIAXone (ROCEPHIN)  IV 2 g (10/26/22 0936)    PRN Medications: acetaminophen-codeine, HYDROmorphone (DILAUDID) injection, methocarbamol, oxyCODONE, pantoprazole  Patient Profile  52 YO w/ chronic systolic heart failure ( LV EF 35% in 2023 ), CKD, obesity, OSA on CPAP currently admitted after presenting with severe left flank pain, dysuria and hematuria secondary to likely complicated UTI.   Assessment/Plan  1. Complicated UTI - UA +, Ucx  - Bld CX - NGTD x 3 days. Follow Bld CX with ICD - WBC stable  - on ceftriaxone per IM  - Appears to have undiagnosed BPH. Started on Flomax. Plan outpatient urology referral    2. AKI - suspect 2/2 hypotensive episode + UTI  - B/l Scr ~1.3-1.5.  - SCr peaked  2.46 overnight--> improved 1.6 .  - avoid further hypotension - follow BMP closely    3. Chronic Systolic Heart Failure - Mixed ischemic/nonischemic cardiomyopathy  - Echo (6/22): EF 20 to 25%, stable from prior exams dating back to 28  - R/LHC (9/22): Severe 3v CAD (no targets for revascularization), mild pulm venous HTN with preserved CO. - Echo (2/23): EF 30-35%, RV normal. IVC not dilated  - He has LBBB but QRS <150 ms. Not candidate for CRT-D.  - s/p MDT ICD - NYHA II stable. Not fluid overloaded on exam. Admit BNP 48. CXR unremarkable  - Appears euvolemic. No diuretics.  - continue coreg 6.25 mg twice a day  - BP improving, start spiro 12.5 mg daily - Keep off SGLT2i recurrent UTIs      4. CAD/NSTEMI 09/22 - Last Cath with progressive 3v CAD as above. No targets for intervention.  - No s/s angina  - Continue DAPT with aspirin + plavix, statin  -Ok to continue coreg.    4. Hypertension - Improving   5. Hyperlipidemia  - Continue statin.   6. DM2 - Last A1c 7.8  - SSI per primary    7. PVD s/p R BKA  - H/o Diabetic foot wound complicated by sepsis that led to amputation on 01/25/2021 - Continue aspirin and statin.   Length of Stay: [place], NP  10/26/2022, 11:18 AM  Advanced Heart Failure Team Pager [phone] (M-F; 7a - 5p)  Please contact [facility] for night-coverage after hours (5p -7a ) and weekends on [email or web address]

## 2022-10-26 NOTE — Progress Notes (Addendum)
PROGRESS NOTE    Mitchell [name]  [id number] DOB: 06-Apr-1971 DOA: 10/23/2022 PCP: [name], MD     Brief Narrative:   chronic HFrEF with LVEF 30-35%, ischemic and nonischemic cardiomyopathy, s/p icd placement in 12/2021, HTN, CKD stage II, IDDM, diabetic neuropathy, OSA on CPAP, morbid obesity, presented with persistent left flank pain feeling nausea, and dysuria and hematuria .   Subjective:  Urine is clear, dysuria has resolved, bp improved Reports ab pain,no n/v,  think he is constipated , he does not want to talk too much today   There is no sob, no lower extremity edema    Bladder scan around 230cc yesterday pm, Reports urine is clearing up,  Had bm today  Continue to reports left side pain, he is observed to have tendency to lay on left side   Blood pressure has improved  Assessment & Plan:  Principal Problem:   AKI (acute kidney injury) ([place]) Active Problems:   CAD (coronary artery disease) with flat troponin    Chronic systolic heart failure ([facility])   CKD (chronic kidney disease) stage 3, GFR 30-59 ml/min ([facility])   Obstructive sleep apnea   Obesity, Class III, BMI 40-49.9 (morbid obesity) ([place])   UTI (urinary tract infection)    Assessment and Plan:  Left side pyelonephritis ? Complicated uti Presents with dysuria, cloudy urine x5 days, blood in urinex1,  Ct  ab/pel no pancreatitis, no kidney stone Blood culture no growth Appear to have brief hypotension initially on presentation, lowest per record was 75/66 Wbc peaked at 12.3 , now back  to normal     Left side flank pain, reports has been for months,He denies back pain -He reports left side pain for several month, worse when sitting for a long time, standing up helps the pain, pain is not radiation to leg -per chart review, he received dilaudid in the ED, has hypotension, patient states "it knock me out", " they had to give me narcan" -appear to have muscular pain, tender with muscle spasm on  left flank pain appear to wrap around to left anterior /lower abdomen -pretty sensitive to light touch, skin appear to be hard, no cellulitis, could be local fluids accumulation in the skin fold -CT ab/pel no acute findings -mild ck elevation at 400, repeat ck has normalized -reports Topical lidocain helped   AKI on CKD II From uti? Also has brief hypotension  Cr peaked at 2.46 , today is 1.6,  Renal dosing meds  Urinary retention? Neurogenic bladder from diabetes? ct ab/pel prostate unremarkable, psa pending bladder scan 230cc,  Started flomax Outpatient follow up with urology  Chronic systolic chf, s/p ICD placement  Cxr no acute findings Entresto and spironolactone held due to aki/borderline low bp Cardiology consulted , management  per cardiology   IDDM2 with hyperglycemia Adjust insulin prn, am blood glucose is 155  Left toe wound Seen by dr Sharol Given, getting betadine and triple abx ointment daily, 4/8 appointement with Dr Sharol Given Wound does not look infected, no drainage , no odor  Elevated uric acid at 10.9 From diuretics? Does not appear to have symptom Will discuss with patient regarding low does allopurinol   Body mass index is 39.3 kg/m.meet obesity criteria  OSa on cpap     I have Reviewed nursing notes, Vitals, pain scores, I/o's, Lab results and  imaging results since pt's last encounter, details please see discussion above  I ordered the following labs:  Unresulted Labs (From admission, onward)  Start     Ordered   10/27/22 [id number]  Basic metabolic panel  Tomorrow morning,   R        10/26/22 1626   10/23/22 1417  Urine Culture  Once,   URGENT       Question Answer Comment  Indication Dysuria   Patient immune status Normal   Release to patient Immediate      10/23/22 1416             DVT prophylaxis: heparin injection 5,000 Units Start: 10/23/22 2200   Code Status:   Code Status: Full Code  Family Communication: patient , updated wife over  the phone with his permission Disposition:   Dispo: The patient is from: home              Anticipated d/c is to: home              Anticipated d/c date is: 24-48hrs, pending cr improvement, needs cardiology clearance   Antimicrobials:    Anti-infectives (From admission, onward)    Start     Dose/Rate Route Frequency Ordered Stop   10/24/22 1000  cefTRIAXone (ROCEPHIN) 1 g in sodium chloride 0.9 % 100 mL IVPB  Status:  Discontinued        1 g 200 mL/hr over 30 Minutes Intravenous Every 24 hours 10/23/22 1759 10/23/22 1830   10/24/22 1000  cefTRIAXone (ROCEPHIN) 2 g in sodium chloride 0.9 % 100 mL IVPB        2 g 200 mL/hr over 30 Minutes Intravenous Every 24 hours 10/23/22 1830     10/23/22 1845  cefTRIAXone (ROCEPHIN) 1 g in sodium chloride 0.9 % 100 mL IVPB       Note to Pharmacy: To make total dose of 2 gm today. His BMI=41 and has pyelonephritis   1 g 200 mL/hr over 30 Minutes Intravenous Every 24 hours 10/23/22 1831 10/23/22 2038   10/23/22 1500  cefTRIAXone (ROCEPHIN) 1 g in sodium chloride 0.9 % 100 mL IVPB        1 g 200 mL/hr over 30 Minutes Intravenous  Once 10/23/22 1455 10/23/22 1649          Objective: Vitals:   10/25/22 2002 10/26/22 0344 10/26/22 0801 10/26/22 1608  BP: 124/70 117/73 131/79 124/78  Pulse: 88 77 77 81  Resp: 18  18 17   Temp: 97.9 F (36.6 C) 98.5 F (36.9 C) 98.3 F (36.8 C) 97.6 F (36.4 C)  TempSrc: Oral Oral Oral Oral  SpO2: 100% 96% 95% 98%  Weight:      Height:        Intake/Output Summary (Last 24 hours) at 10/26/2022 1626 Last data filed at 10/26/2022 1346 Gross per 24 hour  Intake 1060 ml  Output 2275 ml  Net -1215 ml   Filed Weights   10/23/22 1002 10/23/22 2358  Weight: 135.2 kg 127.8 kg    Examination:  General exam: alert, awake, communicative,calm, NAD Respiratory system: Clear to auscultation. Respiratory effort normal. Cardiovascular system:  RRR.  Gastrointestinal system: Abdomen is nondistended, soft and  nontender.  Normal bowel sounds heard. Central nervous system: Alert and oriented. No focal neurological deficits. Extremities:  no edema, h/o left kba Skin: left flank skin fold is tight and tender to touch, no sign of infection Psychiatry: Judgement and insight appear normal. Mood & affect appropriate.     Data Reviewed: I have personally reviewed  labs and visualized  imaging studies since the last encounter and formulate  the plan        Scheduled Meds:  amitriptyline  10 mg Oral QHS   aspirin EC  81 mg Oral Daily   carvedilol  6.25 mg Oral BID WC   clopidogrel  75 mg Oral Daily   gabapentin  100 mg Oral BID   heparin  5,000 Units Subcutaneous Q12H   insulin aspart  0-15 Units Subcutaneous TID WC   insulin aspart  0-5 Units Subcutaneous QHS   insulin glargine-yfgn  42 Units Subcutaneous QHS   lidocaine  1 patch Transdermal Q24H   lidocaine  1 patch Transdermal Q24H   linagliptin  5 mg Oral Daily   polyethylene glycol  17 g Oral BID   rosuvastatin  40 mg Oral Daily   senna-docusate  1 tablet Oral BID   spironolactone  12.5 mg Oral Daily   tamsulosin  0.4 mg Oral QPC supper   Continuous Infusions:  cefTRIAXone (ROCEPHIN)  IV 2 g (10/26/22 0936)     LOS: 2 days   Time spent: 77mins  Florencia Reasons, MD PhD FACP [facility]  Available via Epic secure chat 7am-7pm for nonurgent issues Please page for urgent issues To page the attending provider between 7A-7P or the covering provider during after hours 7P-7A, please log into the web site [email or web address] and access using universal [place] password for that web site. If you do not have the password, please call the hospital operator.    10/26/2022, 4:26 PM

## 2022-10-27 ENCOUNTER — Other Ambulatory Visit (HOSPITAL_COMMUNITY): Payer: Self-pay

## 2022-10-27 DIAGNOSIS — N179 Acute kidney failure, unspecified: Secondary | ICD-10-CM | POA: Diagnosis not present

## 2022-10-27 LAB — GLUCOSE, CAPILLARY
Glucose-Capillary: 222 mg/dL — ABNORMAL HIGH (ref 70–99)
Glucose-Capillary: 170 mg/dL — ABNORMAL HIGH (ref 70–99)

## 2022-10-27 LAB — BASIC METABOLIC PANEL
Anion gap: 10 (ref 5–15)
BUN: 31 mg/dL — ABNORMAL HIGH (ref 6–20)
CO2: 25 mmol/L (ref 22–32)
Calcium: 8.9 mg/dL (ref 8.9–10.3)
Chloride: 103 mmol/L (ref 98–111)
Creatinine, Ser: 1.74 mg/dL — ABNORMAL HIGH (ref 0.61–1.24)
GFR, Estimated: 46 mL/min — ABNORMAL LOW (ref >60)
Glucose, Bld: 208 mg/dL — ABNORMAL HIGH (ref 70–99)
Potassium: 4.3 mmol/L (ref 3.5–5.1)
Sodium: 138 mmol/L (ref 135–145)

## 2022-10-27 MED ORDER — BISACODYL 10 MG RE SUPP
10.0000 mg | Freq: Every day | RECTAL | Status: AC | PRN
  Administered 2022-10-27: 10 mg via RECTAL
  Filled 2022-10-27: qty 1

## 2022-10-27 MED ORDER — ENTRESTO 49-51 MG PO TABS: 1.0000 | ORAL_TABLET | Freq: Two times a day (BID) | ORAL | 0 refills | Status: DC

## 2022-10-27 MED ORDER — SPIRONOLACTONE 25 MG PO TABS: 12.5000 mg | ORAL_TABLET | Freq: Every day | ORAL | 3 refills | Status: DC

## 2022-10-27 MED ORDER — TORSEMIDE 20 MG PO TABS: 40.0000 mg | ORAL_TABLET | Freq: Every day | ORAL | 11 refills | Status: DC

## 2022-10-27 MED ORDER — ENTRESTO 49-51 MG PO TABS
1.0000 | ORAL_TABLET | Freq: Two times a day (BID) | ORAL | 0 refills | Status: DC
  Filled 2022-10-27 (×2): qty 60, 30d supply, fill #0

## 2022-10-27 MED ORDER — TAMSULOSIN HCL 0.4 MG PO CAPS
0.4000 mg | ORAL_CAPSULE | Freq: Every day | ORAL | 0 refills | Status: DC
  Filled 2022-10-27: qty 30, 30d supply, fill #0

## 2022-10-27 MED ORDER — TORSEMIDE 20 MG PO TABS: 20.0000 mg | ORAL_TABLET | Freq: Every day | ORAL | 11 refills | Status: DC

## 2022-10-27 MED ORDER — TAMSULOSIN HCL 0.4 MG PO CAPS: 0.4000 mg | ORAL_CAPSULE | Freq: Every day | ORAL | 0 refills | Status: DC

## 2022-10-27 MED ORDER — BISACODYL 10 MG RE SUPP: 10.0000 mg | Freq: Every day | RECTAL | Status: DC

## 2022-10-27 NOTE — Discharge Summary (Addendum)
Discharge Summary  Mitchell [name] [id number] DOB: Jul 24, 1971  PCP: [name], MD  Admit date: 10/23/2022 Discharge date: 10/27/2022  30 Day Unplanned Readmission Risk Score    Flowsheet Row ED to Hosp-Admission (Current) from 10/23/2022 in [place]  30 Day Unplanned Readmission Risk Score (%) 28.78 Filed at 10/27/2022 0801       This score is the patient's risk of an unplanned readmission within 30 days of being discharged (0 -100%). The score is based on dignosis, age, lab data, medications, orders, and past utilization.   Low:  0-14.9   Medium: 15-21.9   High: 22-29.9   Extreme: 30 and above         Time spent: 44mins, more than 50% time spent on coordination of care.   Recommendations for Outpatient Follow-up:  F/u with PCP within a week  for hospital discharge follow up, repeat cbc/bmp at follow up F/u with heart failure clinic on Monday F/u with ortho Dr Sharol Given F/u with urology     Discharge Diagnoses:  Active Hospital Problems   Diagnosis Date Noted   AKI (acute kidney injury) ([place]) 01/23/2021   CAD (coronary artery disease) with flat troponin  04/11/2021    Priority: 2.   Chronic systolic heart failure ([place]) 10/02/2011    Priority: 3.   CKD (chronic kidney disease) stage 3, GFR 30-59 ml/min (HCC) 12/08/2021    Priority: 6.   Obstructive sleep apnea 10/11/2007    Priority: 10.   UTI (urinary tract infection) 10/23/2022   Obesity, Class III, BMI 40-49.9 (morbid obesity) ([place]) 10/10/2007    Resolved Hospital Problems  No resolved problems to display.    Discharge Condition: stable  Diet recommendation: heart healthy/carb modified  Filed Weights   10/23/22 1002 10/23/22 2358  Weight: 135.2 kg 127.8 kg    History of present illness: ( per admitting MD Dr Roosevelt Locks) PCP: [name], MD (Confirm with patient/family/NH records and if not entered, this has to be entered at [facility] point of entry) Patient  coming from: Home   I have personally briefly reviewed patient's old medical records in [place]   Chief Complaint: Left flank pain, burning pain when urinate, nausea vomiting, blood in the urine   HPI: Mitchell [name] is a 52 y.o. male with medical history significant of chronic HFrEF with LVEF 20-25%, ischemic and nonischemic cardiomyopathy, HTN, CKD stage II, IDDM, diabetic neuropathy, OSA on CPAP, morbid obesity, presented with persistent left flank pain feeling nausea, and dysuria and hematuria today.   Patient has had intermittent left flank pain for 4 to 70-month, cramping-like, intermittent, but denied any dysuria diarrhea nausea vomiting, most episode subsided based on.  This time, patient started to have same left flank cramping like abdominal pain on Friday 10/10, associated with nausea and he went to see ED physician.  Workup showed questionable UTI and elevated lipase 174, patient was reassured and sent home to see his PCP.  Over the last 2 days, patient continued to experience more frequent episode of left sided flank pain initially was nonradiating but this morning, patient started to have left flank pain radiating to penis associated with new hematuria, with worsening of left flank pain.  No fever or chills.  No history of BPH however patient does admitted that he has been having weak stream and intermittent cutoff of urine stream.   ED Course: Afebrile, none tachycardia none hypotension.  However patient complained about 10/10 pain in  the ED and he was given 2 doses of morphine and became hypotensive and unresponsive and 1 dose of Narcan And Patient woke up.   Blood work showed lipase 36 compared to 174 4 days ago, creatinine 2.4 compared to 1.43 days ago, WBC 10.9.  UA showed gross hematuria.  Patient was started on ceftriaxone.  CT abdominal pelvis showed no acute signs of acute pancreatitis and no significant renal/ureter obstructions.  Hospital Course:  Principal Problem:    AKI (acute kidney injury) ([place]) Active Problems:   CAD (coronary artery disease) with flat troponin    Chronic systolic heart failure (HCC)   CKD (chronic kidney disease) stage 3, GFR 30-59 ml/min (HCC)   Obstructive sleep apnea   Obesity, Class III, BMI 40-49.9 (morbid obesity) ([place])   UTI (urinary tract infection)   Assessment and Plan:  Complicated UTI -Presents with dysuria, cloudy urine x5 days, blood in urinex1,  -Ct  ab/pel no pancreatitis, no kidney stone, prostate unremarkable, Blood culture no growth, -urine culture does not appear to reflexed  -Appear to have brief hypotension initially on presentation, lowest per record was 75/66 -Wbc peaked at 12.3 , now back  to normal  -Finished total of 5 days rocephin treatment , urinary symptom resolved        Left side flank pain, reports has been for months,He denies back pain -He reports left side pain for several month, worse when sitting for a long time, standing up helps the pain, pain is not radiation to leg -per chart review, he received dilaudid in the ED, has hypotension, patient states "it knock me out", " they had to give me narcan" -appear to have muscular pain, tender with muscle spasm on left flank pain appear to wrap around to left anterior /lower abdomen -pretty sensitive to light touch, skin appear to be hard, no cellulitis, could be local fluids accumulation in the skin fold -CT ab/pel no acute findings -mild ck elevation at 400, repeat ck has normalized -reports Topical lidocain helped  F/u with pcp   AKI on CKD II From uti? Also has brief hypotension  Cr peaked at 2.46 , cr 1.74 at discharge  Renal dosing meds Heart failure clinic to repeat bmp on Monday,    Urinary retention? Neurogenic bladder from diabetes? ct ab/pel prostate unremarkable, psa unremarkable bladder scan 230cc, Started flomax, repeat bladder scan PVR is 9 by report Also has constipation  Outpatient follow up with urology   Chronic  systolic chf, s/p ICD placement  Cxr no acute findings Entresto and spironolactone held due to aki/borderline low bp Cardiology consulted , meds adjusted  per cardiology F/u with heart failure clinic on Monday  Torsemide 20 mg daily - Start 3/30 Entresto 49-51 mg twice a day - Start 3/30  Spironolactone 12.5 mg daily Carvedilol 6.25 mg twice a day  Aspirin 81 mg daily  Crestor 40 mg daily  Plavix 75 mg daily    IDDM2 , uncontrolled with hyperglycemia A1c 10.2 Continue home regimen , follow up with endocrinology    Left toe wound Seen by dr Sharol Given, getting betadine and triple abx ointment daily, 4/8 appointement with Dr Sharol Given Wound does not look infected, no drainage , no odor   Elevated uric acid at 10.9 From diuretics? Does not appear to have symptom Pcp to follow up to decide on low does allopurinol     Body mass index is 39.3 kg/m.meet obesity criteria  OSa on cpap   Constipation, resolved , encourage bowel regimen  at home to prevent constipation  Discharge Exam: BP (!) 115/58 (BP Location: Right Arm)   Pulse 85   Temp 98.3 F (36.8 C) (Oral)   Resp 16   Ht 5\' 11"  (1.803 m)   Wt 127.8 kg   SpO2 99%   BMI 39.30 kg/m   General: NAD, AAOX3 Cardiovascular: RRR Respiratory: normal respiratory effort     Discharge Instructions     Diet - low sodium heart healthy   Complete by: As directed    Carb modified diet   Discharge wound care:   Complete by: As directed    Continue wound care per Dr Jess Barters instruction   Increase activity slowly   Complete by: As directed       Allergies as of 10/27/2022       Reactions   Farxiga [dapagliflozin] Other (See Comments)   Patient reports loss of consciousness   Nsaids Anaphylaxis, Rash   Able to tolerate aspirin    Trulicity [dulaglutide] Nausea And Vomiting        Medication List     STOP taking these medications    Entresto 97-103 MG Generic drug: sacubitril-valsartan Replaced by: Delene Loll 49-51 MG        TAKE these medications    Accu-Chek Guide test strip Generic drug: glucose blood Use to test blood sugars up to 4 times daily as needed.   Accu-Chek Guide w/Device Kit Use to test blood sugar up to 4 times daily as needed.   Accu-Chek Softclix Lancets lancets Use to test blood sugars up to 4 times daily as needed.   acetaminophen-codeine 300-30 MG tablet Commonly known as: TYLENOL #3 Take 1 tablet by mouth every 6 (six) hours as needed for moderate pain.   amitriptyline 10 MG tablet Commonly known as: ELAVIL Take 1 tablet (10 mg total) by mouth at bedtime.   Aspirin Low Dose 81 MG tablet Generic drug: aspirin EC Take 1 tablet (81 mg total) by mouth daily.   Basaglar KwikPen 100 UNIT/ML Inject 42 Units into the skin daily.   blood glucose meter kit and supplies Dispense based on patient and insurance preference. Use up to four times daily as directed. (FOR ICD-10 E10.9, E11.9).   carvedilol 6.25 MG tablet Commonly known as: COREG TAKE 1 TABLET BY MOUTH TWICE DAILY WITH A MEAL   clopidogrel 75 MG tablet Commonly known as: PLAVIX Take 1 tablet (75 mg total) by mouth daily.   Entresto 49-51 MG Generic drug: sacubitril-valsartan Take 1 tablet by mouth 2 (two) times daily. Start taking on: October 28, 2022 Replaces: Delene Loll 97-103 MG   gabapentin 100 MG capsule Commonly known as: NEURONTIN Take 1 capsule (100 mg total) by mouth 2 (two) times daily.   methocarbamol 500 MG tablet Commonly known as: ROBAXIN Take 1 tablet (500 mg total) by mouth every 6 (six) hours as needed for muscle spasms.   pantoprazole 40 MG tablet Commonly known as: PROTONIX Take 1 tablet (40 mg total) by mouth daily as needed (acid reflux).   potassium chloride SA 20 MEQ tablet Commonly known as: KLOR-CON M Take 1 tablet (20 mEq total) by mouth daily.   rosuvastatin 40 MG tablet Commonly known as: CRESTOR Take 1 tablet (40 mg total) by mouth daily.   spironolactone 25 MG  tablet Commonly known as: ALDACTONE Take 0.5 tablets (12.5 mg total) by mouth daily. What changed: how much to take   tamsulosin 0.4 MG Caps capsule Commonly known as: FLOMAX Take 1 capsule (0.4 mg  total) by mouth daily after supper.   torsemide 20 MG tablet Commonly known as: DEMADEX Take 1 tablet (20 mg total) by mouth daily. Start taking on: October 28, 2022 What changed: how much to take   VITAMIN C PO Take 1 tablet by mouth daily.   VITAMIN D-3 PO Take 1 capsule by mouth daily.               Discharge Care Instructions  (From admission, onward)           Start     Ordered   10/27/22 0000  Discharge wound care:       Comments: Continue wound care per Dr Jess Barters instruction   10/27/22 1132           Allergies  Allergen Reactions   [name] [Dapagliflozin] Other (See Comments)    Patient reports loss of consciousness   Nsaids Anaphylaxis and Rash    Able to tolerate aspirin    Trulicity [Dulaglutide] Nausea And Vomiting    Follow-up Information     Vira Agar, MD Follow up in 1 week(s).   Specialty: Urology Why: BPH? urinary retention , ED Contact information: [address] [place] [address] [phone]         Urology, [facility] Follow up.   Specialty: Urology Contact information: [address] [place] [address] [phone]         [name], MD Follow up in 1 week(s).   Specialty: Family Medicine Why: Hospital discharge follow up, repeat basic lab works including cbc/bmp Contact information: [address] [place]  [address] [place] Follow up on 10/30/2022.   Specialty: Cardiology Why: at 8:30 Contact information: [address] [id number] [place] [phone]                 The results of significant diagnostics from this hospitalization  (including imaging, microbiology, ancillary and laboratory) are listed below for reference.    Significant Diagnostic Studies: DG Abd 1 View  Result Date: 10/26/2022 CLINICAL DATA:  Abdominal pain EXAM: ABDOMEN - 1 VIEW COMPARISON:  CT abdomen pelvis 10/03/2022 FINDINGS: The bowel gas pattern is normal. No radio-opaque calculi or other significant radiographic abnormality are seen. IMPRESSION: Negative. Electronically Signed   By: Lovey Newcomer M.D.   On: 10/26/2022 18:55   DG Chest 1 View  Result Date: 10/24/2022 CLINICAL DATA:  CHF. EXAM: CHEST  1 VIEW COMPARISON:  10/23/2022 FINDINGS: The cardio pericardial silhouette is enlarged. The lungs are clear without focal pneumonia, edema, pneumothorax or pleural effusion. Left pacer/AICD again noted. Telemetry leads overlie the chest. IMPRESSION: Enlargement of the cardiopericardial silhouette without acute cardiopulmonary findings. Electronically Signed   By: Misty Stanley M.D.   On: 10/24/2022 05:59   CT ABDOMEN PELVIS WO CONTRAST  Result Date: 10/23/2022 CLINICAL DATA:  Severe pancreatitis EXAM: CT ABDOMEN AND PELVIS WITHOUT CONTRAST TECHNIQUE: Multidetector CT imaging of the abdomen and pelvis was performed following the standard protocol without IV contrast. RADIATION DOSE REDUCTION: This exam was performed according to the departmental dose-optimization program which includes automated exposure control, adjustment of the mA and/or kV according to patient size and/or use of iterative reconstruction technique. COMPARISON:  Renal stone CT 03/15/2022 and older FINDINGS: Lower chest: There is some breathing motion at the lung bases. Mild basilar atelectasis. No effusion. The heart  is enlarged. Pacemaker leads along the right side of the heart. Slightly patulous thoracic esophagus. Hepatobiliary: On this non IV contrast exam, the liver is grossly preserved. Pancreas: Pancreas is nonenlarged. No significant peripancreatic fat stranding fluid collection. No CT  sequela pancreatitis on this noncontrast exam. Sensitivity can be increased with a contrast exam as clinically appropriate. Spleen: Normal in size without focal abnormality. Adrenals/Urinary Tract: Stable mild thickening of the right adrenal gland. Left adrenal gland is preserved. No abnormal calcification seen within either kidney nor along the course of either ureter. Preserved contours of the urinary bladder. Stomach/Bowel: The large bowel has a normal course and caliber with scattered stool. Mild stool burden. Normal appendix extends medial and inferior to the cecum in the right hemipelvis. There is mild debris in the stomach. Small bowel is nondilated. Vascular/Lymphatic: Mild vascular calcifications along the aorta and branch vessels. Normal caliber aorta and IVC. No specific abnormal lymph node enlargement present in the abdomen and pelvis. Reproductive: Prostate is unremarkable. Other: Small fat containing right inguinal hernia. Musculoskeletal: Mild degenerative changes along the spine. IMPRESSION: No obstructing renal stone.  Mild stool.  Normal appendix. No sequela of pancreatitis seen on this noncontrast CT examination. Electronically Signed   By: Jill Side M.D.   On: 10/23/2022 12:45   DG Chest Portable 1 View  Result Date: 10/23/2022 CLINICAL DATA:  Shortness of EXAM: PORTABLE CHEST 1 VIEW COMPARISON:  Radiograph 10/21/2022 FINDINGS: Unchanged enlarged cardiac silhouette with single lead AICD. Faint opacity overlying the region of the lingula. No large effusion or evidence of pneumothorax. No acute osseous abnormality. Thoracic spondylosis. IMPRESSION: Faint opacity in the region of the lingula, could be atelectasis or developing infection. Unchanged cardiomegaly.  No overt pulmonary edema or large effusion. Electronically Signed   By: Maurine Simmering M.D.   On: 10/23/2022 10:23   DG Chest Portable 1 View  Result Date: 10/21/2022 CLINICAL DATA:  Shortness of breath, generalized abdominal pain,  nausea, vomiting EXAM: PORTABLE CHEST 1 VIEW COMPARISON:  Portable exam 2046 hours compared to 07/09/2022 FINDINGS: LEFT subclavian ICD with lead projecting over RIGHT ventricle. Upper normal heart size. Mediastinal contours and pulmonary vascularity normal. Lungs clear. No pulmonary infiltrate, pleural effusion, or pneumothorax. Osseous structures unremarkable. IMPRESSION: No acute abnormalities. Electronically Signed   By: Lavonia Dana M.D.   On: 10/21/2022 20:52   CUP PACEART REMOTE DEVICE CHECK  Result Date: 10/18/2022 Scheduled remote reviewed. Normal device function.  Next remote 91 days. LA  DG Foot Complete Left  Result Date: 10/09/2022 CLINICAL DATA:  Foot pain great toe pain EXAM: LEFT FOOT - COMPLETE 3+ VIEW COMPARISON:  09/04/2022 FINDINGS: No fracture. Moderate plantar calcaneal spur. Degenerative changes at the first MTP joint. No osseous destructive change. No soft tissue emphysema. IMPRESSION: No acute osseous abnormality. Electronically Signed   By: Donavan Foil M.D.   On: 10/09/2022 23:44    Microbiology: Recent Results (from the past 240 hour(s))  Blood culture (routine x 2)     Status: None (Preliminary result)   Collection Time: 10/23/22  5:28 PM   Specimen: BLOOD RIGHT HAND  Result Value Ref Range Status   Specimen Description BLOOD RIGHT HAND  Final   Special Requests   Final    BOTTLES DRAWN AEROBIC AND ANAEROBIC Blood Culture adequate volume   Culture   Final    NO GROWTH 4 DAYS Performed at [facility], [place] [address] [place], [place] [address]    Report Status PENDING  Incomplete  Blood culture (routine x 2)     Status: None (Preliminary result)   Collection Time: 10/23/22  8:35 PM   Specimen: BLOOD LEFT FOREARM  Result Value Ref Range Status   Specimen Description BLOOD LEFT FOREARM  Final   Special Requests   Final    BOTTLES DRAWN AEROBIC AND ANAEROBIC Blood Culture results may not be optimal due to an inadequate volume of blood received in culture  bottles   Culture   Final    NO GROWTH 4 DAYS Performed at [facility], [place] [address], [place], [place] [address]    Report Status PENDING  Incomplete     Labs: Basic Metabolic Panel: Recent Labs  Lab 10/23/22 1020 10/24/22 0051 10/25/22 0057 10/26/22 0317 10/27/22 0401  NA 137 137 139 138 138  K 4.5 4.7 4.0 4.2 4.3  CL 102 101 106 103 103  CO2 23 26 24 26 25   GLUCOSE 306* 326* 181* 155* 208*  BUN 43* 46* 39* 36* 31*  CREATININE 2.46* 2.14* 1.59* 1.61* 1.74*  CALCIUM 8.8* 8.3* 8.7* 8.8* 8.9   Liver Function Tests: Recent Labs  Lab 10/21/22 1936 10/23/22 1020  AST 17 23  ALT 11 12  ALKPHOS 70 64  BILITOT 0.3 0.5  PROT 8.0 7.2  ALBUMIN 4.2 3.6   Recent Labs  Lab 10/21/22 1936 10/23/22 1020  LIPASE 174* 36   No results for input(s): "AMMONIA" in the last 168 hours. CBC: Recent Labs  Lab 10/21/22 1936 10/23/22 1020 10/24/22 0051 10/25/22 0057 10/26/22 0317  WBC 9.5 10.9* 12.3* 6.8 7.9  NEUTROABS 6.8 8.3*  --  4.5 5.8  HGB 13.4 13.6 12.3* 11.9* 11.6*  HCT 40.8 41.9 38.1* 35.7* 34.6*  MCV 90.3 91.7 91.8 90.6 90.8  PLT 158 148* 134* 125* 128*   Cardiac Enzymes: Recent Labs  Lab 10/23/22 2032 10/26/22 0317  CKTOTAL 410* 253   BNP: BNP (last 3 results) Recent Labs    07/05/22 1542 10/21/22 1936 10/23/22 1002  BNP 136.0* 181.3* 48.8    ProBNP (last 3 results) No results for input(s): "PROBNP" in the last 8760 hours.  CBG: Recent Labs  Lab 10/25/22 2005 10/26/22 1609 10/26/22 2200 10/27/22 0812 10/27/22 1217  GLUCAP 228* 176* 195* 222* 170*    FURTHER DISCHARGE INSTRUCTIONS:   Get Medicines reviewed and adjusted: Please take all your medications with you for your next visit with your Primary MD   Laboratory/radiological data: Please request your Primary MD to go over all hospital tests and procedure/radiological results at the follow up, please ask your Primary MD to get all Hospital records sent to his/her office.   In  some cases, they will be blood work, cultures and biopsy results pending at the time of your discharge. Please request that your primary care M.D. goes through all the records of your hospital data and follows up on these results.   Also Note the following: If you experience worsening of your admission symptoms, develop shortness of breath, life threatening emergency, suicidal or homicidal thoughts you must seek medical attention immediately by calling 911 or calling your MD immediately  if symptoms less severe.   You must read complete instructions/literature along with all the possible adverse reactions/side effects for all the Medicines you take and that have been prescribed to you. Take any new Medicines after you have completely understood and accpet all the possible adverse reactions/side effects.    Do not drive when taking Pain medications or sleeping medications (Benzodaizepines)  Do not take more than prescribed Pain, Sleep and Anxiety Medications. It is not advisable to combine anxiety,sleep and pain medications without talking with your primary care practitioner   Special Instructions: If you have smoked or chewed Tobacco  in the last 2 yrs please stop smoking, stop any regular Alcohol  and or any Recreational drug use.   Wear Seat belts while driving.   Please note: You were cared for by a hospitalist during your hospital stay. Once you are discharged, your primary care physician will handle any further medical issues. Please note that NO REFILLS for any discharge medications will be authorized once you are discharged, as it is imperative that you return to your primary care physician (or establish a relationship with a primary care physician if you do not have one) for your post hospital discharge needs so that they can reassess your need for medications and monitor your lab values.     Signed:  Florencia Reasons MD, PhD, FACP  [facility] 10/27/2022, 1:26 PM

## 2022-10-27 NOTE — Progress Notes (Signed)
RN at bedside to assist PT to bathroom. Pt is tearful and starts to yell that " I don't want to see that doctor anymore" He is saying over and over "I can't poop." Medications given in the [facility] as scheduled without any relief. Pt c/o of abd pain, passing gas, and painful constipation. RN paged On call provider and is waiting for a response. Pt reports decrease mobility, increase lower pain pain, and increased pain with ambulation. RN had to use the [facility] to safely return to bed. Pt is requesting something stronger for bowel relief.

## 2022-10-27 NOTE — Progress Notes (Signed)
Advanced Heart Failure Rounding Note  PCP-Cardiologist: Glori Bickers, MD   Subjective:   Wants to go home. Denies SOB.   Objective:   Weight Range: 127.8 kg Body mass index is 39.3 kg/m.   Vital Signs:   Temp:  [97.6 F (36.4 C)-98.3 F (36.8 C)] 98.3 F (36.8 C) (03/29 0737) Pulse Rate:  [76-85] 85 (03/29 0737) Resp:  [16-17] 16 (03/29 0737) BP: (109-124)/(58-80) 115/58 (03/29 0737) SpO2:  [94 %-99 %] 99 % (03/29 0737) Last BM Date : 10/25/22  Weight change: Filed Weights   10/23/22 1002 10/23/22 2358  Weight: 135.2 kg 127.8 kg    Intake/Output:   Intake/Output Summary (Last 24 hours) at 10/27/2022 1047 Last data filed at 10/27/2022 0121 Gross per 24 hour  Intake 1036 ml  Output 1080 ml  Net -44 ml    Physical Exam   General:  Well appearing. No resp difficulty HEENT: normal Neck: supple. JVP 9-10 no JVD. Carotids 2+ bilat; no bruits. No lymphadenopathy or thryomegaly appreciated. Cor: PMI nondisplaced. Regular rate & rhythm. No rubs, gallops or murmurs. Lungs: clear Abdomen: soft, nontender, nondistended. No hepatosplenomegaly. No bruits or masses. Good bowel sounds. Extremities: no cyanosis, clubbing, rash, edema. R BKA Neuro: alert & orientedx3, cranial nerves grossly intact. moves all 4 extremities w/o difficulty. Affect pleasant  Telemetry   SR 70-80s   EKG    No new EKG to review  Labs    CBC Recent Labs    10/25/22 0057 10/26/22 0317  WBC 6.8 7.9  NEUTROABS 4.5 5.8  HGB 11.9* 11.6*  HCT 35.7* 34.6*  MCV 90.6 90.8  PLT 125* 0000000*   Basic Metabolic Panel Recent Labs    10/26/22 0317 10/27/22 0401  NA 138 138  K 4.2 4.3  CL 103 103  CO2 26 25  GLUCOSE 155* 208*  BUN 36* 31*  CREATININE 1.61* 1.74*  CALCIUM 8.8* 8.9   Liver Function Tests No results for input(s): "AST", "ALT", "ALKPHOS", "BILITOT", "PROT", "ALBUMIN" in the last 72 hours.  No results for input(s): "LIPASE", "AMYLASE" in the last 72 hours.  Cardiac  Enzymes Recent Labs    10/26/22 0317  CKTOTAL 253    BNP: BNP (last 3 results) Recent Labs    07/05/22 1542 10/21/22 1936 10/23/22 1002  BNP 136.0* 181.3* 48.8    ProBNP (last 3 results) No results for input(s): "PROBNP" in the last 8760 hours.   D-Dimer No results for input(s): "DDIMER" in the last 72 hours. Hemoglobin A1C Recent Labs    10/25/22 0057  HGBA1C 10.2*   Fasting Lipid Panel No results for input(s): "CHOL", "HDL", "LDLCALC", "TRIG", "CHOLHDL", "LDLDIRECT" in the last 72 hours. Thyroid Function Tests No results for input(s): "TSH", "T4TOTAL", "T3FREE", "THYROIDAB" in the last 72 hours.  Invalid input(s): "FREET3"  Other results:   Imaging   DG Abd 1 View  Result Date: 10/26/2022 CLINICAL DATA:  Abdominal pain EXAM: ABDOMEN - 1 VIEW COMPARISON:  CT abdomen pelvis 10/03/2022 FINDINGS: The bowel gas pattern is normal. No radio-opaque calculi or other significant radiographic abnormality are seen. IMPRESSION: Negative. Electronically Signed   By: Lovey Newcomer M.D.   On: 10/26/2022 18:55    Medications:     Scheduled Medications:  amitriptyline  10 mg Oral QHS   aspirin EC  81 mg Oral Daily   carvedilol  6.25 mg Oral BID WC   clopidogrel  75 mg Oral Daily   gabapentin  100 mg Oral BID   heparin  5,000 Units Subcutaneous Q12H   insulin aspart  0-15 Units Subcutaneous TID WC   insulin aspart  0-5 Units Subcutaneous QHS   insulin glargine-yfgn  42 Units Subcutaneous QHS   lidocaine  1 patch Transdermal Q24H   lidocaine  1 patch Transdermal Q24H   linagliptin  5 mg Oral Daily   polyethylene glycol  17 g Oral BID   rosuvastatin  40 mg Oral Daily   senna-docusate  1 tablet Oral BID   spironolactone  12.5 mg Oral Daily   tamsulosin  0.4 mg Oral QPC supper    Infusions:  cefTRIAXone (ROCEPHIN)  IV 2 g (10/27/22 0942)    PRN Medications: acetaminophen-codeine, HYDROmorphone (DILAUDID) injection, methocarbamol, oxyCODONE, pantoprazole  Patient  Profile  52 YO w/ chronic systolic heart failure ( LV EF 35% in 2023 ), CKD, obesity, OSA on CPAP currently admitted after presenting with severe left flank pain, dysuria and hematuria secondary to likely complicated UTI.  Assessment/Plan  1. Complicated UTI - UA +, Ucx  - Bld CX - NGTD x 3 days. Follow Bld CX with ICD - WBC stable  - on ceftriaxone per IM  - Appears to have undiagnosed BPH. Started on Flomax. Plan outpatient urology referral    2. AKI - suspect 2/2 hypotensive episode + UTI  - B/l Scr ~1.3-1.5.  - SCr peaked  2.46 overnight--> 1.7 today.   - avoid further hypotension - follow BMP closely    3. Chronic Systolic Heart Failure - Mixed ischemic/nonischemic cardiomyopathy  - Echo (6/22): EF 20 to 25%, stable from prior exams dating back to 66  - R/LHC (9/22): Severe 3v CAD (no targets for revascularization), mild pulm venous HTN with preserved CO. - Echo (2/23): EF 30-35%, RV normal. IVC not dilated  - He has LBBB but QRS <150 ms. Not candidate for CRT-D.  - s/p MDT ICD - NYHA II stable. Not fluid overloaded on exam. Admit BNP 48. CXR unremarkable  -Tomorrow add torsemide 20 mg daily. Concerned he will eat high sodium diet after he leaves .  -Tomorrow start entresto 49-51 mg twice a day. At home he was taking 97-103 twice a day.  -continue coreg 6.25 mg twice a day  -  spiro 12.5 mg daily - Keep off SGLT2i recurrent UTIs     4. CAD/NSTEMI 09/22 - Last Cath with progressive 3v CAD as above. No targets for intervention.  - No s/s angina  - Continue DAPT with aspirin + plavix, statin  -Ok to continue coreg.    4. Hypertension - Stable.    5. Hyperlipidemia  - Continue statin.   6. DM2 - Last A1c 7.8  - SSI per primary    7. PVD s/p R BKA  - H/o Diabetic foot wound complicated by sepsis that led to amputation on 01/25/2021 - Continue aspirin and statin.   Torsemide 20 mg daily - Start 3/30 Entresto 49-51 mg twice a day - Start 3/30  Spironolactone 12.5  mg daily Carvedilol 6.25 mg twice a day  Aspirin 81 mg daily  Crestor 40 mg daily  Plavix 75 mg daily   OK for discharge. Will set up HF follow up for Monday at 830 and check BMET at that time.   Length of Stay: 3  Shraddha Lebron, NP  10/27/2022, 10:47 AM  Advanced Heart Failure Team Pager [phone] (M-F; 7a - 5p)  Please contact [facility] for night-coverage after hours (5p -7a ) and weekends on [email or web address]

## 2022-10-27 NOTE — Plan of Care (Signed)
Discharge instructions discussed with patient.  Patient instructed on home medications, restrictions, and follow up appointments. Belongings gathered and sent with patient.  Patients wants his medications to be sent to [facility] on [address] instead of TOC. RN notified physician.  Patient discharged via wheelchair by this Probation officer.

## 2022-10-27 NOTE — TOC Transition Note (Addendum)
Transition of Care [facility]) - CM/SW Discharge Note   Patient Details  Name: Mitchell [name] MRN: [id number] Date of Birth: [birth year]  Transition of [facility]) CM/SW Contact:  Erenest Rasher, RN Phone Number: [phone] 10/27/2022, 12:29 PM   Clinical Narrative:     CM spoke to pt at bedside. States he does not need HH at this time. He has RW, cane, scale, wheelchair and shower chair at home. Pt states his wife has the contact information to Patient Experience.  Unit RN will print off work note and provide to patient.   Final next level of care: Home/Self Care Barriers to Discharge: No Barriers Identified   Patient Goals and CMS Choice      Discharge Placement     Discharge Plan and Services Additional resources added to the After Visit Summary for        Social Determinants of Health (SDOH) Interventions SDOH Screenings   Food Insecurity: No Food Insecurity (04/13/2021)  Housing: Low Risk  (04/13/2021)  Transportation Needs: No Transportation Needs (04/13/2021)  Utilities: Not At Risk (10/27/2022)  Alcohol Screen: Low Risk  (05/15/2018)  Depression (PHQ2-9): Low Risk  (07/26/2022)  Financial Resource Strain: Medium Risk (04/13/2021)  Tobacco Use: Low Risk  (10/23/2022)     Readmission Risk Interventions     No data to display

## 2022-10-28 ENCOUNTER — Other Ambulatory Visit (HOSPITAL_COMMUNITY): Payer: Self-pay | Admitting: Internal Medicine

## 2022-10-28 ENCOUNTER — Other Ambulatory Visit: Payer: Self-pay | Admitting: Family Medicine

## 2022-10-28 LAB — CULTURE, BLOOD (ROUTINE X 2)
Culture: NO GROWTH
Culture: NO GROWTH
Special Requests: ADEQUATE

## 2022-10-30 ENCOUNTER — Other Ambulatory Visit (HOSPITAL_COMMUNITY): Payer: Self-pay

## 2022-10-30 ENCOUNTER — Telehealth: Payer: Self-pay

## 2022-10-30 ENCOUNTER — Encounter (HOSPITAL_COMMUNITY): Payer: No Typology Code available for payment source

## 2022-10-30 MED ORDER — CARVEDILOL 6.25 MG PO TABS: 6.2500 mg | ORAL_TABLET | Freq: Two times a day (BID) | ORAL | 3 refills | Status: DC

## 2022-10-30 NOTE — Transitions of Care (Post Inpatient/ED Visit) (Signed)
   10/30/2022  Name: Mitchell [name] MRN: [id number] DOB: 07/17/1971  Today's TOC FU Call Status: Today's TOC FU Call Status:: Unsuccessul Call (1st Attempt) Unsuccessful Call (1st Attempt) Date: 10/30/22  Attempted to reach the patient regarding the most recent Inpatient/ED visit.  Follow Up Plan: Additional outreach attempts will be made to reach the patient to complete the Transitions of Care (Post Inpatient/ED visit) call.   Signature Juanda Crumble, [place] Direct Dial [phone]

## 2022-10-31 ENCOUNTER — Emergency Department (HOSPITAL_BASED_OUTPATIENT_CLINIC_OR_DEPARTMENT_OTHER)
Admission: EM | Admit: 2022-10-31 | Discharge: 2022-10-31 | Disposition: A | Discharge status: Home / Self Care | Payer: No Typology Code available for payment source | Attending: Emergency Medicine | Admitting: Emergency Medicine

## 2022-10-31 ENCOUNTER — Encounter (HOSPITAL_BASED_OUTPATIENT_CLINIC_OR_DEPARTMENT_OTHER): Payer: Self-pay

## 2022-10-31 ENCOUNTER — Emergency Department (HOSPITAL_BASED_OUTPATIENT_CLINIC_OR_DEPARTMENT_OTHER): Payer: No Typology Code available for payment source | Admitting: Radiology

## 2022-10-31 ENCOUNTER — Other Ambulatory Visit: Payer: Self-pay

## 2022-10-31 DIAGNOSIS — I509 Heart failure, unspecified: Secondary | ICD-10-CM | POA: Diagnosis not present

## 2022-10-31 DIAGNOSIS — M25511 Pain in right shoulder: Secondary | ICD-10-CM | POA: Diagnosis present

## 2022-10-31 DIAGNOSIS — M19011 Primary osteoarthritis, right shoulder: Secondary | ICD-10-CM | POA: Insufficient documentation

## 2022-10-31 DIAGNOSIS — N182 Chronic kidney disease, stage 2 (mild): Secondary | ICD-10-CM | POA: Diagnosis not present

## 2022-10-31 DIAGNOSIS — Z7982 Long term (current) use of aspirin: Secondary | ICD-10-CM | POA: Insufficient documentation

## 2022-10-31 DIAGNOSIS — Z794 Long term (current) use of insulin: Secondary | ICD-10-CM | POA: Insufficient documentation

## 2022-10-31 DIAGNOSIS — G8929 Other chronic pain: Secondary | ICD-10-CM

## 2022-10-31 DIAGNOSIS — Z7984 Long term (current) use of oral hypoglycemic drugs: Secondary | ICD-10-CM | POA: Insufficient documentation

## 2022-10-31 DIAGNOSIS — Z7902 Long term (current) use of antithrombotics/antiplatelets: Secondary | ICD-10-CM | POA: Insufficient documentation

## 2022-10-31 DIAGNOSIS — I130 Hypertensive heart and chronic kidney disease with heart failure and stage 1 through stage 4 chronic kidney disease, or unspecified chronic kidney disease: Secondary | ICD-10-CM | POA: Diagnosis not present

## 2022-10-31 DIAGNOSIS — E1122 Type 2 diabetes mellitus with diabetic chronic kidney disease: Secondary | ICD-10-CM | POA: Insufficient documentation

## 2022-10-31 DIAGNOSIS — M1990 Unspecified osteoarthritis, unspecified site: Secondary | ICD-10-CM

## 2022-10-31 MED ORDER — ACETAMINOPHEN 500 MG PO TABS
1000.0000 mg | ORAL_TABLET | Freq: Once | ORAL | Status: AC
  Administered 2022-10-31: 1000 mg via ORAL
  Filled 2022-10-31: qty 2

## 2022-10-31 NOTE — ED Triage Notes (Signed)
POV from home, A&O x 4, GCS 15, amb to room  Pt sts that he began having right shoulder pain since yesterday, denies injury or trauma, hx of arthritis.
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
## 2022-10-31 NOTE — Transitions of Care (Post Inpatient/ED Visit) (Signed)
   10/31/2022  Name: Mitchell [name] MRN: [id number] DOB: 11-Jun-1971  Today's TOC FU Call Status: Today's TOC FU Call Status:: Successful TOC FU Call Competed Unsuccessful Call (1st Attempt) Date: 10/30/22 [facility] FU Call Complete Date: 10/31/22  Transition Care Management Follow-up Telephone Call Date of Discharge: 10/27/22 Discharge [facility]) Type of Discharge: Inpatient Admission Primary Inpatient Discharge Diagnosis:: acute kidney failure How have you been since you were released from the hospital?: Same Any questions or concerns?: No  Items Reviewed: Did you receive and understand the discharge instructions provided?: Yes Medications obtained and verified?: Yes (Medications Reviewed) Any new allergies since your discharge?: No Dietary orders reviewed?: Yes Do you have support at home?: Yes People in Home: spouse  Home Care and Equipment/Supplies: [place] Ordered?: NA Any new equipment or medical supplies ordered?: NA  Functional Questionnaire: Do you need assistance with bathing/showering or dressing?: No Do you need assistance with meal preparation?: No Do you need assistance with eating?: No Do you have difficulty maintaining continence: No Do you need assistance with getting out of bed/getting out of a chair/moving?: No Do you have difficulty managing or taking your medications?: No  Follow up appointments reviewed: PCP Follow-up appointment confirmed?: Yes Date of PCP follow-up appointment?: 11/02/22 Follow-up Provider: [facility] Follow-up appointment confirmed?: Yes Date of Specialist follow-up appointment?: 11/03/22 Follow-Up Specialty Provider:: Dr Sammuel Bailiff Do you need transportation to your follow-up appointment?: No Do you understand care options if your condition(s) worsen?: Yes-patient verbalized understanding    [place], [place] Nurse Health Advisor Direct Dial [phone]

## 2022-10-31 NOTE — ED Notes (Signed)
Discharge instructions, pain management, and follow up care with ortho reviewed and explained, pt verbalized understanding and had no further questions on d/c. Pt caox4, ambulatory on d/c.

## 2022-10-31 NOTE — Discharge Instructions (Addendum)
Take Tylenol for pain control and follow-up with orthopedic surgeon (Dr. Sharol Given)

## 2022-10-31 NOTE — Progress Notes (Addendum)
[facility] [address], [address] [place], [place] [address] [phone]  Date:  11/02/2022   Name:  Mitchell [name]   DOB:  [birth year]   MRN:  [id number]  PCP:  Pearline Cables, MD    Chief Complaint: Follow-up (Concerns/ questions: pt would like to know if the last labs he had drawn here in office may have indicated the Pancreatitis and inflamed Prostate. )   History of Present Illness:  Mitchell [name] is a 52 y.o. very pleasant male patient who presents with the following:  Pt seen today for hospital follow-up Last visit with myself in December  History of CVA, schizophrenia versus bipolar disorder, OSA, obesity, CAD, HTN, hyperlipidemia, DM with history of right BKA, CRI He was recently admitted 3/25- 3/29 He then went back to the ER on 4/2 with concern of shoulder pain  Pt states his main concern was "overdose of dilaudid" he got in the ER- he was treated with Narcan  He notes his "head has been hurting a lot" since he was admitted  They did not do a CT of his head during this admission  Pt notes he continues to have flank pain since November- will seem to come and go He is not aware of anything that causes this or makes it go away  However sitting for a long time may cause worsening of his pain  He does work and needs a note regarding his absence from work while he was sick   Urology follow-up was recommended by inpt team but not ordered as of yet   Assessment and Plan: Complicated UTI -Presents with dysuria, cloudy urine x5 days, blood in urinex1,  -Ct  ab/pel no pancreatitis, no kidney stone, prostate unremarkable, Blood culture no growth, -urine culture does not appear to reflexed  -Appear to have brief hypotension initially on presentation, lowest per record was 75/66 -Wbc peaked at 12.3 , now back  to normal  -Finished total of 5 days rocephin treatment , urinary symptom resolved  Left side flank pain, reports  has been for months,He denies back pain -He reports left side pain for several month, worse when sitting for a long time, standing up helps the pain, pain is not radiation to leg -per chart review, he received dilaudid in the ED, has hypotension, patient states "it knock me out", " they had to give me narcan" -appear to have muscular pain, tender with muscle spasm on left flank pain appear to wrap around to left anterior /lower abdomen -pretty sensitive to light touch, skin appear to be hard, no cellulitis, could be local fluids accumulation in the skin fold -CT ab/pel no acute findings -mild ck elevation at 400, repeat ck has normalized -reports Topical lidocain helped  F/u with pcp AKI on CKD II From uti? Also has brief hypotension  Cr peaked at 2.46 , cr 1.74 at discharge  Renal dosing meds Heart failure clinic to repeat bmp on Monday,  Urinary retention? Neurogenic bladder from diabetes? ct ab/pel prostate unremarkable, psa unremarkable bladder scan 230cc, Started flomax, repeat bladder scan PVR is 9 by report Also has constipation  Outpatient follow up with urology Chronic systolic chf, s/p ICD placement  Cxr no acute findings Entresto and spironolactone held due to aki/borderline low bp Cardiology consulted , meds adjusted  per cardiology F/u with heart failure clinic on Monday Torsemide 20 mg daily - Start 3/30 Entresto 49-51 mg twice a day -  Start 3/30  Spironolactone 12.5 mg daily Carvedilol 6.25 mg twice a day  Aspirin 81 mg daily  Crestor 40 mg daily  Plavix 75 mg daily  IDDM2 , uncontrolled with hyperglycemia A1c 10.2 Continue home regimen , follow up with endocrinology  Left toe wound Seen by Dr Lajoyce Corners, getting betadine and triple abx ointment daily, 4/8 appointement with Dr Lajoyce Corners Wound does not look infected, no drainage , no odor Elevated uric acid at 10.9 From diuretics? Does not appear to have symptom Pcp to follow up to decide on low does allopurinol  Pt notes  he continues to have the same pain in his side, this did not seem to improve with treatment of UTI He has had this pain for about 4 months now.  It is hard for him to give more details about this pain.  It does not seem to follow any pattern temporarily but pt notes if he leans his body away from the left side it seems better.   He has his first appt with DR Elvera Lennox coming up in August since Dr Everardo All retired  Patient Active Problem List   Diagnosis Date Noted   UTI (urinary tract infection) 10/23/2022   ICD (implantable cardioverter-defibrillator) in place 04/19/2022   Near syncope 12/08/2021   CKD (chronic kidney disease) stage 3, GFR 30-59 ml/min 12/08/2021   PVD s/p R LE amputation  12/08/2021   Diabetic retinopathy associated with type 2 diabetes mellitus 06/06/2021   CAD (coronary artery disease) with flat troponin  04/11/2021   S/P below knee amputation, right 02/07/2021   AKI (acute kidney injury) 01/23/2021   QT prolongation 07/30/2019   Acute on chronic combined systolic and diastolic CHF (congestive heart failure) 07/29/2019   Stroke 05/08/2017   Uncontrolled type 2 diabetes mellitus with hyperglycemia, with long-term current use of insulin 12/07/2015   Chronic systolic heart failure 10/02/2011   Erectile dysfunction 10/02/2011   Obstructive sleep apnea 10/11/2007   HLD (hyperlipidemia) 10/10/2007   HYPOKALEMIA 10/10/2007   Obesity, Class III, BMI 40-49.9 (morbid obesity) 10/10/2007   Essential hypertension 10/10/2007   PREMATURE VENTRICULAR CONTRACTIONS 10/10/2007   COLONIC POLYPS, HX OF 10/10/2007    Past Medical History:  Diagnosis Date   Bipolar disorder    CAD (coronary artery disease)    a. diffuse 3v CAD by cath 2019, medical therapy recommended.   Cataract    forming    Chronic systolic CHF (congestive heart failure)    CVA (cerebral infarction)    No residual deficits   Depression    PTSD,    Diabetes mellitus    TYPE II; UNCONTROLLED BY HEMOGLOBIN A1c;  STABLE AS  PER DISCHARGE   Headache(784.0)    Herpes simplex of male genitalia    History of colonic polyps    Hyperlipidemia    Hypertension    Myocardial infarction 1987   (while playing football)   Obesity    OSA (obstructive sleep apnea)    repeat study 2018 without significant OSA   Pneumonia    Post-cardiac injury syndrome    History of cardiac injury from blunt trauma   Pulmonary hypertension    a. moderately elevated PASP 07/2019.   PVCs (premature ventricular contractions)    Schizophrenia    Goes to [facility]   Sleep apnea    Stroke 2005   some left side weakness   Syncope    Recurrent, thought to be vasovagal. Also has h/o frequent PVCs.     Past  Surgical History:  Procedure Laterality Date   AMPUTATION Right 01/25/2021   Procedure: RIGHT BELOW KNEE AMPUTATION;  Surgeon: Nadara Mustard, MD;  Location: [facility] OR;  Service: Orthopedics;  Laterality: Right;   CARDIAC CATHETERIZATION  12/19/10   DIFFUSE NONOBSTRUCTIVE CAD; NONISCHEMIC CARDIOMYOPATHY; LEFT VENTRICULAR ANGIOGRAM WAS PERFORMED SECONDARY TO  ELEVATED LEFT VENTRICULAR FILLING PRESSURES   COLONOSCOPY     ~ age 26-23   COLONOSCOPY W/ POLYPECTOMY     I & D EXTREMITY Bilateral 10/24/2019   Procedure: IRRIGATION AND DEBRIDEMENT BILATERAL EXTREMITY WOUND ON FOOT;  Surgeon: Allena Napoleon, MD;  Location: [facility] OR;  Service: Plastics;  Laterality: Bilateral;   ICD IMPLANT N/A 01/13/2022   Procedure: ICD IMPLANT;  Surgeon: Marinus Maw, MD;  Location: [facility] INVASIVE CV LAB;  Service: Cardiovascular;  Laterality: N/A;   METATARSAL HEAD EXCISION Right 08/29/2018   Procedure: METATARSAL HEAD RESECTION;  Surgeon: Felecia Shelling, DPM;  Location: [facility] OR;  Service: Podiatry;  Laterality: Right;   METATARSAL OSTEOTOMY Right 08/29/2018   Procedure: SUB FIFTHE METATARSIA RIGHT FOOT;  Surgeon: Felecia Shelling, DPM;  Location: [facility] OR;  Service: Podiatry;  Laterality: Right;   MULTIPLE EXTRACTIONS WITH ALVEOLOPLASTY   01/27/2014   "all my teeth; 4 Quadrants of alveoloplasty   MULTIPLE EXTRACTIONS WITH ALVEOLOPLASTY N/A 01/27/2014   Procedure: EXTRACTION OF TEETH #'1, 2, 3, 4, 5, 6, 7, 8, 9, 10, 11, 12, 13, 14, 15, 16, 17, 20, 21, 22, 23, 24, 25, 26, 27, 28, 29, 31 and 32 WITH ALVEOLOPLASTY;  Surgeon: Charlynne Pander, DDS;  Location: [facility] OR;  Service: Oral Surgery;  Laterality: N/A;   ORIF FINGER / THUMB FRACTURE Right    POLYPECTOMY     RIGHT HEART CATH N/A 01/27/2021   Procedure: RIGHT HEART CATH;  Surgeon: Dolores Patty, MD;  Location: [facility] INVASIVE CV LAB;  Service: Cardiovascular;  Laterality: N/A;   RIGHT/LEFT HEART CATH AND CORONARY ANGIOGRAPHY N/A 09/26/2017   Procedure: RIGHT/LEFT HEART CATH AND CORONARY ANGIOGRAPHY;  Surgeon: Dolores Patty, MD;  Location: [facility] INVASIVE CV LAB;  Service: Cardiovascular;  Laterality: N/A;   RIGHT/LEFT HEART CATH AND CORONARY ANGIOGRAPHY N/A 04/12/2021   Procedure: RIGHT/LEFT HEART CATH AND CORONARY ANGIOGRAPHY;  Surgeon: Dolores Patty, MD;  Location: [facility] INVASIVE CV LAB;  Service: Cardiovascular;  Laterality: N/A;   SKIN SPLIT GRAFT Bilateral 10/24/2019   Procedure: SKIN GRAFT SPLIT THICKNESS LEFT THIGH;  Surgeon: Allena Napoleon, MD;  Location: [facility] OR;  Service: Plastics;  Laterality: Bilateral;   WOUND DEBRIDEMENT Right 08/29/2018   Procedure: Debridement of ulcer on right fifth metatarsal;  Surgeon: Felecia Shelling, DPM;  Location: [facility] OR;  Service: Podiatry;  Laterality: Right;    Social History   Tobacco Use   Smoking status: Never   Smokeless tobacco: Never  Vaping Use   Vaping Use: Never used  Substance Use Topics   Alcohol use: No   Drug use: No    Family History  Problem Relation Age of Onset   Heart disease Mother        MI   Heart failure Mother    Diabetes Mother        ALSO IN MOST OF HIS SIBLINGS; 2 UNLCES HAVE ALSO PASSED AWAY FROM DM   Cardiomyopathy Mother    Cancer - Ovarian Mother    Ovarian cancer Mother    Heart disease  Father    Hypertension Father    Diabetes Father    Diabetes Sister  Diabetes Brother    Colon cancer Paternal Uncle    Colon cancer Paternal Uncle    Colon polyps Neg Hx    Esophageal cancer Neg Hx    Rectal cancer Neg Hx    Stomach cancer Neg Hx     Allergies  Allergen Reactions   Farxiga [Dapagliflozin] Other (See Comments)    Patient reports loss of consciousness   Nsaids Anaphylaxis and Rash    Able to tolerate aspirin    Trulicity [Dulaglutide] Nausea And Vomiting    Medication list has been reviewed and updated.  Current Outpatient Medications on File Prior to Visit  Medication Sig Dispense Refill   Accu-Chek Softclix Lancets lancets Use to test blood sugars up to 4 times daily as needed. 100 each 0   acetaminophen-codeine (TYLENOL #3) 300-30 MG tablet Take 1 tablet by mouth every 6 (six) hours as needed for moderate pain. 30 tablet 0   amitriptyline (ELAVIL) 10 MG tablet Take 1 tablet (10 mg total) by mouth at bedtime. 90 tablet 2   Ascorbic Acid (VITAMIN C PO) Take 1 tablet by mouth daily.     aspirin 81 MG EC tablet Take 1 tablet (81 mg total) by mouth daily. 30 tablet 12   blood glucose meter kit and supplies Dispense based on patient and insurance preference. Use up to four times daily as directed. (FOR ICD-10 E10.9, E11.9). 1 each 0   Blood Glucose Monitoring Suppl (BLOOD GLUCOSE MONITOR SYSTEM) w/Device KIT Use to test blood sugar up to 4 times daily as needed. 1 kit 0   carvedilol (COREG) 6.25 MG tablet Take 1 tablet (6.25 mg total) by mouth 2 (two) times daily with a meal. 180 tablet 3   Cholecalciferol (VITAMIN D-3 PO) Take 1 capsule by mouth daily.     clopidogrel (PLAVIX) 75 MG tablet Take 1 tablet (75 mg total) by mouth daily. 90 tablet 3   gabapentin (NEURONTIN) 100 MG capsule Take 1 capsule (100 mg total) by mouth 2 (two) times daily. 60 capsule 3   glucose blood (ACCU-CHEK GUIDE) test strip Use to test blood sugars up to 4 times daily as needed. 100 each  0   Insulin Glargine (BASAGLAR KWIKPEN) 100 UNIT/ML Inject 42 Units into the skin daily. 15 mL 3   methocarbamol (ROBAXIN) 500 MG tablet Take 1 tablet (500 mg total) by mouth every 6 (six) hours as needed for muscle spasms. 30 tablet 2   pantoprazole (PROTONIX) 40 MG tablet Take 1 tablet (40 mg total) by mouth daily as needed (acid reflux). 30 tablet 6   potassium chloride SA (KLOR-CON M) 20 MEQ tablet Take 1 tablet (20 mEq total) by mouth daily. 90 tablet 2   rosuvastatin (CRESTOR) 40 MG tablet Take 1 tablet (40 mg total) by mouth daily. 90 tablet 3   sacubitril-valsartan (ENTRESTO) 49-51 MG Take 1 tablet by mouth 2 (two) times daily. 60 tablet 0   spironolactone (ALDACTONE) 25 MG tablet Take 0.5 tablets (12.5 mg total) by mouth daily. 90 tablet 3   tamsulosin (FLOMAX) 0.4 MG CAPS capsule Take 1 capsule (0.4 mg total) by mouth daily after supper. 30 capsule 0   torsemide (DEMADEX) 20 MG tablet Take 1 tablet (20 mg total) by mouth daily. 180 tablet 11   No current facility-administered medications on file prior to visit.    Review of Systems:  As per HPI- otherwise negative. BP Readings from Last 3 Encounters:  11/02/22 108/60  10/31/22 (!) 100/55  10/27/22 (!) 115/58  Physical Examination: Vitals:   11/02/22 1451  BP: 108/60  Pulse: 86  Resp: 18  Temp: 97.6 F (36.4 C)  SpO2: 96%   Vitals:   11/02/22 1451  Height: 5\' 11"  (1.803 m)   Body mass index is 39.3 kg/m. Ideal Body Weight: Weight in (lb) to have BMI = 25: 178.9  GEN: no acute distress.  Obese, status post right BKA.  Seated in wheelchair HEENT: Atraumatic, Normocephalic.  Ears and Nose: No external deformity. CV: RRR, No M/G/R. No JVD. No thrill. No extra heart sounds. PULM: CTA B, no wheezes, crackles, rhonchi. No retractions. No resp. distress. No accessory muscle use. ABD: S, NT, ND, No rebound. No HSM. I am not able to pinpoint the location of his left side tenderness.  No rash or swell, redness,  no  masses noted EXTR: No c/c/e PSYCH: Normally interactive. Conversant.  S/p right BKA   Assessment and Plan: Neurogenic bladder - Plan: Ambulatory referral to Urology  Hematuria, unspecified type  Left flank pain  Type 2 diabetes mellitus with other specified complication, with long-term current use of insulin  Pt seen today for hospital follow-up - he was recently admitted with a few concerns but main issue UTI This was treated, CT abd pelvis negative. However he does continue to complain of left side pain.  At this point MSK origin seems most likely, I encouraged him to try diclofenac gel  DM is very poorly controlled.  Pt is a difficult case with mental health concerns and he is a poor historian  He reports he is taking insulin glarging 42 units either once or twice a day- we think just once- and no other DM meds at this time.   He has [place] listed as an allergy- LOC- and Trulicity listed due to nausea/ vomiting  Metformin is problematic due to his renal function Will get in touch with endocrinology about his care  He has an appt to see Dr Lajoyce Corners for his toe wound next week  Made him to appt to see me in one month - this will also give [place] an opportunity to follow-up on his labs     Signed Abbe Amsterdam, MD Addnd 4/12-  Heard back from his endocrinologist who suggested adding glipizide if we don't want to use multiple daily insulin injections  The only other option left , short of putting him on prandial insulin is maybe to try glipizide in addition to his basal insulin.  He has the risk of hypoglycemia may increase but is still better than being on 3 additional shots of insulin I would do a small dose of 5 mg before his first meal of the day only  I called pt and he would like to add this, sent in rx for him

## 2022-11-02 ENCOUNTER — Ambulatory Visit (INDEPENDENT_AMBULATORY_CARE_PROVIDER_SITE_OTHER): Payer: No Typology Code available for payment source | Admitting: Family Medicine

## 2022-11-02 VITALS — BP 108/60 | HR 86 | Temp 97.6°F | Resp 18 | Ht 71.0 in

## 2022-11-02 DIAGNOSIS — N319 Neuromuscular dysfunction of bladder, unspecified: Secondary | ICD-10-CM

## 2022-11-02 DIAGNOSIS — R109 Unspecified abdominal pain: Secondary | ICD-10-CM | POA: Diagnosis not present

## 2022-11-02 DIAGNOSIS — E1169 Type 2 diabetes mellitus with other specified complication: Secondary | ICD-10-CM

## 2022-11-02 DIAGNOSIS — Z794 Long term (current) use of insulin: Secondary | ICD-10-CM

## 2022-11-02 DIAGNOSIS — R319 Hematuria, unspecified: Secondary | ICD-10-CM | POA: Diagnosis not present

## 2022-11-02 NOTE — Patient Instructions (Addendum)
Try some diclofenac gel for your side I will set you up with urology here at the [facility] I will also touch base with Dr Cruzita Lederer- your new endocrinologist- regarding your diabetes control and get her recs for now I think you are meant to be taking insulin 42 units once a day

## 2022-11-06 ENCOUNTER — Ambulatory Visit (INDEPENDENT_AMBULATORY_CARE_PROVIDER_SITE_OTHER): Payer: No Typology Code available for payment source | Admitting: Orthopedic Surgery

## 2022-11-06 ENCOUNTER — Encounter: Payer: Self-pay | Admitting: Orthopedic Surgery

## 2022-11-06 DIAGNOSIS — M7541 Impingement syndrome of right shoulder: Secondary | ICD-10-CM | POA: Diagnosis not present

## 2022-11-06 MED ORDER — LIDOCAINE HCL 1 % IJ SOLN
5.0000 mL | INTRAMUSCULAR | Status: AC | PRN
  Administered 2022-11-06: 5 mL

## 2022-11-06 MED ORDER — METHYLPREDNISOLONE ACETATE 40 MG/ML IJ SUSP
40.0000 mg | INTRAMUSCULAR | Status: AC | PRN
  Administered 2022-11-06: 40 mg via INTRA_ARTICULAR

## 2022-11-06 NOTE — Progress Notes (Signed)
Office Visit Note   Patient: Mitchell [name]           Date of Birth: 11/17/1970           MRN: [id number] Visit Date: 11/06/2022              Requested by: Pearline Cablesopland, Jessica C, MD [address] [place],  [place] [id number] PCP: Pearline Cablesopland, Jessica C, MD  Chief Complaint  Patient presents with   Right Leg - Follow-up    Hx right BKA   Right Shoulder - Follow-up    Went to ER last week for right shoulder pain       HPI: Patient is a 52 year old gentleman who presents for evaluation for right shoulder pain.  Patient states he went to the emergency room last week for shoulder pain.  Patient states of note he was given too high of a dose of Dilaudid and he was hospitalized for about a week.  Patient states he has decreased range of motion with his shoulder.  Assessment & Plan: Visit Diagnoses:  1. Impingement syndrome of right shoulder     Plan: Right shoulder subacromial space was injected he tolerated this well follow-up in 4 weeks.  Follow-Up Instructions: Return in about 4 weeks (around 12/04/2022).   Ortho Exam  Patient is alert, oriented, no adenopathy, well-dressed, normal affect, normal respiratory effort. Examination patient has abduction and flexion to 90 degrees.  Patient has pain with Neer and Hawkins impingement test the biceps tendon is tender to palpation.  No radicular symptoms.  Radiograph shows mild arthritis.  Imaging: No results found. No images are attached to the encounter.  Labs: Lab Results  Component Value Date   HGBA1C 10.2 (H) 10/25/2022   HGBA1C 11.8 (H) 06/15/2022   HGBA1C 7.8 (A) 08/19/2021   ESRSEDRATE 20 (H) 03/08/2022   ESRSEDRATE 81 (H) 10/21/2019   ESRSEDRATE 25 (H) 06/17/2018   CRP 10.3 (H) 01/28/2021   CRP 12.8 (H) 01/27/2021   CRP 21.1 (H) 01/26/2021   LABURIC 10.9 (H) 10/23/2022   REPTSTATUS 10/28/2022 FINAL 10/23/2022   GRAMSTAIN  08/29/2018    RARE WBC PRESENT, PREDOMINANTLY PMN NO ORGANISMS SEEN    CULT   10/23/2022    NO GROWTH 5 DAYS Performed at [facility], [address] [place], [place] [address]    LABORGA KLEBSIELLA PNEUMONIAE 05/21/2018   LABORGA ENTEROBACTER SPECIES 05/21/2018   LABORGA STAPHYLOCOCCUS AUREUS 05/21/2018     Lab Results  Component Value Date   ALBUMIN 3.6 10/23/2022   ALBUMIN 4.2 10/21/2022   ALBUMIN 4.3 09/04/2022   PREALBUMIN 6.8 (L) 10/21/2019    Lab Results  Component Value Date   MG 2.3 11/29/2021   MG 1.5 (L) 04/12/2021   MG 1.8 02/16/2021   Lab Results  Component Value Date   VD25OH 11 (L) 08/16/2009    Lab Results  Component Value Date   PREALBUMIN 6.8 (L) 10/21/2019      Latest Ref Rng & Units 10/26/2022    3:17 AM 10/25/2022   12:57 AM 10/24/2022   12:51 AM  CBC EXTENDED  WBC 4.0 - 10.5 K/uL 7.9  6.8  12.3   RBC 4.22 - 5.81 MIL/uL 3.81  3.94  4.15   Hemoglobin 13.0 - 17.0 g/dL 01.011.6  27.211.9  53.612.3   HCT 39.0 - 52.0 % 34.6  35.7  38.1   Platelets 150 - 400 K/uL 128  125  134   NEUT# 1.7 -  7.7 K/uL 5.8  4.5    Lymph# 0.7 - 4.0 K/uL 1.4  1.6       There is no height or weight on file to calculate BMI.  Orders:  No orders of the defined types were placed in this encounter.  No orders of the defined types were placed in this encounter.    Procedures: Large Joint Inj: R subacromial bursa on 11/06/2022 5:00 PM Indications: diagnostic evaluation and pain Details: 22 G 1.5 in needle, posterior approach  Arthrogram: No  Medications: 5 mL lidocaine 1 %; 40 mg methylPREDNISolone acetate 40 MG/ML Outcome: tolerated well, no immediate complications Procedure, treatment alternatives, risks and benefits explained, specific risks discussed. Consent was given by the patient. Immediately prior to procedure a time out was called to verify the correct patient, procedure, equipment, support staff and site/side marked as required. Patient was prepped and draped in the usual sterile fashion.      Clinical Data: No additional  findings.  ROS:  All other systems negative, except as noted in the HPI. Review of Systems  Objective: Vital Signs: There were no vitals taken for this visit.  Specialty Comments:  No specialty comments available.  PMFS History: Patient Active Problem List   Diagnosis Date Noted   UTI (urinary tract infection) 10/23/2022   ICD (implantable cardioverter-defibrillator) in place 04/19/2022   Near syncope 12/08/2021   CKD (chronic kidney disease) stage 3, GFR 30-59 ml/min 12/08/2021   PVD s/p R LE amputation  12/08/2021   Diabetic retinopathy associated with type 2 diabetes mellitus 06/06/2021   CAD (coronary artery disease) with flat troponin  04/11/2021   S/P below knee amputation, right 02/07/2021   AKI (acute kidney injury) 01/23/2021   QT prolongation 07/30/2019   Acute on chronic combined systolic and diastolic CHF (congestive heart failure) 07/29/2019   Stroke 05/08/2017   Uncontrolled type 2 diabetes mellitus with hyperglycemia, with long-term current use of insulin 12/07/2015   Chronic systolic heart failure 10/02/2011   Erectile dysfunction 10/02/2011   Obstructive sleep apnea 10/11/2007   HLD (hyperlipidemia) 10/10/2007   HYPOKALEMIA 10/10/2007   Obesity, Class III, BMI 40-49.9 (morbid obesity) 10/10/2007   Essential hypertension 10/10/2007   PREMATURE VENTRICULAR CONTRACTIONS 10/10/2007   COLONIC POLYPS, HX OF 10/10/2007   Past Medical History:  Diagnosis Date   Bipolar disorder    CAD (coronary artery disease)    a. diffuse 3v CAD by cath 2019, medical therapy recommended.   Cataract    forming    Chronic systolic CHF (congestive heart failure)    CVA (cerebral infarction)    No residual deficits   Depression    PTSD,    Diabetes mellitus    TYPE II; UNCONTROLLED BY HEMOGLOBIN A1c; STABLE AS  PER DISCHARGE   Headache(784.0)    Herpes simplex of male genitalia    History of colonic polyps    Hyperlipidemia    Hypertension    Myocardial infarction 1987    (while playing football)   Obesity    OSA (obstructive sleep apnea)    repeat study 2018 without significant OSA   Pneumonia    Post-cardiac injury syndrome    History of cardiac injury from blunt trauma   Pulmonary hypertension    a. moderately elevated PASP 07/2019.   PVCs (premature ventricular contractions)    Schizophrenia    [facility]   Sleep apnea    Stroke 2005   some left side weakness   Syncope  Recurrent, thought to be vasovagal. Also has h/o frequent PVCs.     Family History  Problem Relation Age of Onset   Heart disease Mother        MI   Heart failure Mother    Diabetes Mother        ALSO IN MOST OF HIS SIBLINGS; 2 UNLCES HAVE ALSO PASSED AWAY FROM DM   Cardiomyopathy Mother    Cancer - Ovarian Mother    Ovarian cancer Mother    Heart disease Father    Hypertension Father    Diabetes Father    Diabetes Sister    Diabetes Brother    Colon cancer Paternal Uncle    Colon cancer Paternal Uncle    Colon polyps Neg Hx    Esophageal cancer Neg Hx    Rectal cancer Neg Hx    Stomach cancer Neg Hx     Past Surgical History:  Procedure Laterality Date   AMPUTATION Right 01/25/2021   Procedure: RIGHT BELOW KNEE AMPUTATION;  Surgeon: Nadara Mustard, MD;  Location: [facility] OR;  Service: Orthopedics;  Laterality: Right;   CARDIAC CATHETERIZATION  12/19/10   DIFFUSE NONOBSTRUCTIVE CAD; NONISCHEMIC CARDIOMYOPATHY; LEFT VENTRICULAR ANGIOGRAM WAS PERFORMED SECONDARY TO  ELEVATED LEFT VENTRICULAR FILLING PRESSURES   COLONOSCOPY     ~ age [age]   COLONOSCOPY W/ POLYPECTOMY     I & D EXTREMITY Bilateral 10/24/2019   Procedure: IRRIGATION AND DEBRIDEMENT BILATERAL EXTREMITY WOUND ON FOOT;  Surgeon: Allena Napoleon, MD;  Location: [facility] OR;  Service: Plastics;  Laterality: Bilateral;   ICD IMPLANT N/A 01/13/2022   Procedure: ICD IMPLANT;  Surgeon: Marinus Maw, MD;  Location: [facility] INVASIVE CV LAB;  Service: Cardiovascular;  Laterality: N/A;   METATARSAL  HEAD EXCISION Right 08/29/2018   Procedure: METATARSAL HEAD RESECTION;  Surgeon: Felecia Shelling, DPM;  Location: [facility] OR;  Service: Podiatry;  Laterality: Right;   METATARSAL OSTEOTOMY Right 08/29/2018   Procedure: SUB FIFTHE METATARSIA RIGHT FOOT;  Surgeon: Felecia Shelling, DPM;  Location: [facility] OR;  Service: Podiatry;  Laterality: Right;   MULTIPLE EXTRACTIONS WITH ALVEOLOPLASTY  01/27/2014   "all my teeth; 4 Quadrants of alveoloplasty   MULTIPLE EXTRACTIONS WITH ALVEOLOPLASTY N/A 01/27/2014   Procedure: EXTRACTION OF TEETH #'1, 2, 3, 4, 5, 6, 7, 8, 9, 10, 11, 12, 13, 14, 15, 16, 17, 20, 21, 22, 23, 24, 25, 26, 27, 28, 29, 31 and 32 WITH ALVEOLOPLASTY;  Surgeon: Charlynne Pander, DDS;  Location: [facility] OR;  Service: Oral Surgery;  Laterality: N/A;   ORIF FINGER / THUMB FRACTURE Right    POLYPECTOMY     RIGHT HEART CATH N/A 01/27/2021   Procedure: RIGHT HEART CATH;  Surgeon: Dolores Patty, MD;  Location: [facility] INVASIVE CV LAB;  Service: Cardiovascular;  Laterality: N/A;   RIGHT/LEFT HEART CATH AND CORONARY ANGIOGRAPHY N/A 09/26/2017   Procedure: RIGHT/LEFT HEART CATH AND CORONARY ANGIOGRAPHY;  Surgeon: Dolores Patty, MD;  Location: [facility] INV[facility] CV LAB;  Service: Cardiovascular;  Laterality: N/A;   RIGHT/LEFT HEART CATH AND CORONARY ANGIOGRAPHY N/A 04/12/2021   Procedure: RIGHT/LEFT HEART CATH AND CORONARY ANGIOGRAPHY;  Surgeon: Dolores Patty, MD;  Location: [facility] INVAS[facility] CV LAB;  Service: Cardiovascular;  Laterality: N/A;   SKIN SPLIT GRAFT Bilateral 10/24/2019   Procedure: SKIN GRAFT SPLIT THICKNESS LEFT THIGH;  Surgeon: Allena Napoleon, MD;  Location: [facility];  Service: Plastics;  Laterality: Bilateral;   WOUND DEBRIDEMENT Right 08/29/2018   Procedure: Debridement of ulcer  on right fifth metatarsal;  Surgeon: Felecia Shelling, DPM;  Location: [facility] OR;  Service: Podiatry;  Laterality: Right;   Social History   Occupational History   Occupation: Mortician  Tobacco Use   Smoking status: Never    Smokeless tobacco: Never  Vaping Use   Vaping Use: Never used  Substance and Sexual Activity   Alcohol use: No   Drug use: No   Sexual activity: Not on file

## 2022-11-08 ENCOUNTER — Other Ambulatory Visit: Payer: Self-pay

## 2022-11-08 ENCOUNTER — Emergency Department (HOSPITAL_BASED_OUTPATIENT_CLINIC_OR_DEPARTMENT_OTHER)
Admission: EM | Admit: 2022-11-08 | Discharge: 2022-11-08 | Disposition: A | Discharge status: Home / Self Care | Payer: No Typology Code available for payment source | Attending: Emergency Medicine | Admitting: Emergency Medicine

## 2022-11-08 ENCOUNTER — Encounter (HOSPITAL_BASED_OUTPATIENT_CLINIC_OR_DEPARTMENT_OTHER): Payer: Self-pay

## 2022-11-08 DIAGNOSIS — I509 Heart failure, unspecified: Secondary | ICD-10-CM | POA: Diagnosis not present

## 2022-11-08 DIAGNOSIS — D696 Thrombocytopenia, unspecified: Secondary | ICD-10-CM | POA: Insufficient documentation

## 2022-11-08 DIAGNOSIS — E119 Type 2 diabetes mellitus without complications: Secondary | ICD-10-CM | POA: Insufficient documentation

## 2022-11-08 DIAGNOSIS — D72829 Elevated white blood cell count, unspecified: Secondary | ICD-10-CM | POA: Diagnosis not present

## 2022-11-08 DIAGNOSIS — Z7902 Long term (current) use of antithrombotics/antiplatelets: Secondary | ICD-10-CM | POA: Diagnosis not present

## 2022-11-08 DIAGNOSIS — Z794 Long term (current) use of insulin: Secondary | ICD-10-CM | POA: Insufficient documentation

## 2022-11-08 DIAGNOSIS — I110 Hypertensive heart disease with heart failure: Secondary | ICD-10-CM | POA: Diagnosis not present

## 2022-11-08 DIAGNOSIS — K625 Hemorrhage of anus and rectum: Secondary | ICD-10-CM | POA: Diagnosis present

## 2022-11-08 DIAGNOSIS — Z8673 Personal history of transient ischemic attack (TIA), and cerebral infarction without residual deficits: Secondary | ICD-10-CM | POA: Diagnosis not present

## 2022-11-08 DIAGNOSIS — Z7982 Long term (current) use of aspirin: Secondary | ICD-10-CM | POA: Insufficient documentation

## 2022-11-08 DIAGNOSIS — Z79899 Other long term (current) drug therapy: Secondary | ICD-10-CM | POA: Diagnosis not present

## 2022-11-08 LAB — OCCULT BLOOD X 1 CARD TO LAB, STOOL: Fecal Occult Bld: NEGATIVE

## 2022-11-08 LAB — COMPREHENSIVE METABOLIC PANEL
ALT: 9 U/L (ref 0–44)
AST: 15 U/L (ref 15–41)
Albumin: 4.4 g/dL (ref 3.5–5.0)
Alkaline Phosphatase: 67 U/L (ref 38–126)
Anion gap: 11 (ref 5–15)
BUN: 61 mg/dL — ABNORMAL HIGH (ref 6–20)
CO2: 24 mmol/L (ref 22–32)
Calcium: 9.4 mg/dL (ref 8.9–10.3)
Chloride: 102 mmol/L (ref 98–111)
Creatinine, Ser: 1.87 mg/dL — ABNORMAL HIGH (ref 0.61–1.24)
GFR, Estimated: 42 mL/min — ABNORMAL LOW (ref >60)
Glucose, Bld: 302 mg/dL — ABNORMAL HIGH (ref 70–99)
Potassium: 4.1 mmol/L (ref 3.5–5.1)
Sodium: 137 mmol/L (ref 135–145)
Total Bilirubin: 0.3 mg/dL (ref 0.3–1.2)
Total Protein: 7.6 g/dL (ref 6.5–8.1)

## 2022-11-08 LAB — CBC
HCT: 39 % (ref 39.0–52.0)
Hemoglobin: 12.9 g/dL — ABNORMAL LOW (ref 13.0–17.0)
MCH: 29.9 pg (ref 26.0–34.0)
MCHC: 33.1 g/dL (ref 30.0–36.0)
MCV: 90.3 fL (ref 80.0–100.0)
Platelets: 148 10*3/uL — ABNORMAL LOW (ref 150–400)
RBC: 4.32 MIL/uL (ref 4.22–5.81)
RDW: 12.1 % (ref 11.5–15.5)
WBC: 11 10*3/uL — ABNORMAL HIGH (ref 4.0–10.5)
nRBC: 0 % (ref 0.0–0.2)

## 2022-11-08 NOTE — Discharge Instructions (Signed)
As discussed, workup today overall reassuring.  Sample obtained during rectal exam was negative for blood.  Please return if you have gross blood in stool, become dizzy/lightheaded, abdominal pain returns, develop fever, etc.  Otherwise, recommend follow-up with gastroenterology; see number attached to call to set up an appointment.  Recommend use of MiraLAX or polyethylene glycol at home to help invoke regular, soft bowel movements; please avoid straining.  You may decrease MiraLAX if you develop diarrhea or increase if stool is still hard.  Avoid nonsteroidal medications like Aleve/ibuprofen.  Please do not hesitate to return to emergency department for worrisome signs and symptoms we discussed become apparent.

## 2022-11-08 NOTE — ED Notes (Signed)
Hemoccult card at bedside 

## 2022-11-08 NOTE — ED Triage Notes (Signed)
Patient here POV from Home.  Endorses Some ABD Discomfort that began while at work today and went to [employer]. Didn't have a BM but passed a Blood Clot which concerned Patient. Pain has since passed mostly.  No N/V/D.   NAD Noted during Triage. A&Ox4. [id number]. Ambulatory.

## 2022-11-08 NOTE — ED Notes (Signed)
Patient given discharge instructions. Questions were answered. Patient verbalized understanding of discharge instructions and care at home.  

## 2022-11-08 NOTE — ED Provider Notes (Signed)
[facility] EMERGENCY DEPARTMENT AT [facility] Provider Note   CSN: [id number] Arrival date & time: 11/08/22  1220     History  Chief Complaint  Patient presents with   Rectal Bleeding    Mitchell [name] is a 52 y.o. male.   Rectal Bleeding   52 year old male presents emergency department complaints of rectal bleeding.  Patient states that he was at work and had lower abdominal cramping type sensation and had a bowel movement.  Notes passed large clot in stool bright red in appearance.  Denies gross blood.  Denies history of similar symptoms in the past.  Denies any rectal bleeding or current abdominal pain.  Patient is on Plavix but no anticoagulation.  Denies fever, chills, nausea, vomiting, urinary symptoms.  Past medical history seen for MI, CVA, CHF, diabetes mellitus, hypertension, hyperlipidemia, OSA  Home Medications Prior to Admission medications   Medication Sig Start Date End Date Taking? Authorizing Provider  Accu-Chek Softclix Lancets lancets Use to test blood sugars up to 4 times daily as needed. 12/08/21   Orland Mustard, MD  acetaminophen-codeine (TYLENOL #3) 300-30 MG tablet Take 1 tablet by mouth every 6 (six) hours as needed for moderate pain. 06/15/22   [name], Gwenlyn Found, MD  amitriptyline (ELAVIL) 10 MG tablet Take 1 tablet (10 mg total) by mouth at bedtime. 07/26/22   [name], Gwenlyn Found, MD  Ascorbic Acid (VITAMIN C PO) Take 1 tablet by mouth daily.    [provider]  aspirin 81 MG EC tablet Take 1 tablet (81 mg total) by mouth daily. 04/13/21   Danford, Earl Lites, MD  blood glucose meter kit and supplies Dispense based on patient and insurance preference. Use up to four times daily as directed. (FOR ICD-10 E10.9, E11.9). 06/15/22   [name], Gwenlyn Found, MD  Blood Glucose Monitoring Suppl (BLOOD GLUCOSE MONITOR SYSTEM) w/Device KIT Use to test blood sugar up to 4 times daily as needed. 12/08/21   Arby Barrette, MD  carvedilol (COREG) 6.25  MG tablet Take 1 tablet (6.25 mg total) by mouth 2 (two) times daily with a meal. 10/30/22   Bensimhon, [name], MD  Cholecalciferol (VITAMIN D-3 PO) Take 1 capsule by mouth daily.    [provider]  clopidogrel (PLAVIX) 75 MG tablet Take 1 tablet (75 mg total) by mouth daily. 07/26/22   Bensimhon, [name], MD  gabapentin (NEURONTIN) 100 MG capsule Take 1 capsule (100 mg total) by mouth 2 (two) times daily. 07/26/22   [name], Gwenlyn Found, MD  glucose blood (ACCU-CHEK GUIDE) test strip Use to test blood sugars up to 4 times daily as needed. 12/08/21   Orland Mustard, MD  Insulin Glargine [facility]) 100 UNIT/ML Inject 42 Units into the skin daily. 09/29/22   [name], Gwenlyn Found, MD  methocarbamol (ROBAXIN) 500 MG tablet Take 1 tablet (500 mg total) by mouth every 6 (six) hours as needed for muscle spasms. 07/26/22   [name], Gwenlyn Found, MD  pantoprazole (PROTONIX) 40 MG tablet Take 1 tablet (40 mg total) by mouth daily as needed (acid reflux). 02/13/22   [name], Gwenlyn Found, MD  potassium chloride SA (KLOR-CON M) 20 MEQ tablet Take 1 tablet (20 mEq total) by mouth daily. 05/05/22   Milford, Anderson Malta, FNP  rosuvastatin (CRESTOR) 40 MG tablet Take 1 tablet (40 mg total) by mouth daily. 07/26/22   Bensimhon, [name], MD  sacubitril-valsartan (ENTRESTO) 49-51 MG Take 1 tablet by mouth 2 (two) times daily. 10/28/22   Albertine Grates,  MD  spironolactone (ALDACTONE) 25 MG tablet Take 0.5 tablets (12.5 mg total) by mouth daily. 10/27/22   Albertine Grates, MD  tamsulosin (FLOMAX) 0.4 MG CAPS capsule Take 1 capsule (0.4 mg total) by mouth daily after supper. 10/27/22   Albertine Grates, MD  torsemide (DEMADEX) 20 MG tablet Take 1 tablet (20 mg total) by mouth daily. 10/28/22   Albertine Grates, MD      Allergies    Marcelline Deist [dapagliflozin], Nsaids, and Trulicity [dulaglutide]    Review of Systems   Review of Systems  Gastrointestinal:  Positive for hematochezia.  All other systems reviewed and are negative.   Physical  Exam Updated Vital Signs BP 114/73 (BP Location: Right Arm)   Pulse 87   Temp 98.3 F (36.8 C) (Oral)   Resp 16   Ht 6' (1.829 m)   Wt 86.2 kg   SpO2 96%   BMI 25.77 kg/m  Physical Exam Vitals and nursing note reviewed.  Constitutional:      General: He is not in acute distress.    Appearance: He is well-developed.  HENT:     Head: Normocephalic and atraumatic.  Eyes:     Conjunctiva/sclera: Conjunctivae normal.  Cardiovascular:     Rate and Rhythm: Normal rate and regular rhythm.  Pulmonary:     Effort: Pulmonary effort is normal. No respiratory distress.     Breath sounds: Normal breath sounds.  Abdominal:     Palpations: Abdomen is soft.     Tenderness: There is no abdominal tenderness. There is no right CVA tenderness, left CVA tenderness or guarding.  Genitourinary:    Rectum: Guaiac result positive.     Comments: Patient with normal rectal tone with bright red blood appreciated on digital rectal exam.  No gross hematochezia appreciated or active bleeding.  No obvious hemorrhoid or palpable fissure. Musculoskeletal:        General: No swelling.     Cervical back: Neck supple.  Skin:    General: Skin is warm and dry.     Capillary Refill: Capillary refill takes less than 2 seconds.  Neurological:     Mental Status: He is alert.  Psychiatric:        Mood and Affect: Mood normal.     ED Results / Procedures / Treatments   Labs (all labs ordered are listed, but only abnormal results are displayed) Labs Reviewed  COMPREHENSIVE METABOLIC PANEL - Abnormal; Notable for the following components:      Result Value   Glucose, Bld 302 (*)    BUN 61 (*)    Creatinine, Ser 1.87 (*)    GFR, Estimated 42 (*)    All other components within normal limits  CBC - Abnormal; Notable for the following components:   WBC 11.0 (*)    Hemoglobin 12.9 (*)    Platelets 148 (*)    All other components within normal limits  OCCULT BLOOD X 1 CARD TO LAB, STOOL     EKG None  Radiology No results found.  Procedures Procedures    Medications Ordered in ED Medications - No data to display  ED Course/ Medical Decision Making/ A&P                             Medical Decision Making Amount and/or Complexity of Data Reviewed Labs: ordered.   This patient presents to the ED for concern of rectal bleeding, this involves an extensive number of treatment  options, and is a complaint that carries with it a high risk of complications and morbidity.  The differential diagnosis includes hemorrhoid, fissure, ischemic bowel   Co morbidities that complicate the patient evaluation  See HPI   Additional history obtained:  Additional history obtained from EMR External records from outside source obtained and reviewed including hospital records   Lab Tests:  I Ordered, and personally interpreted labs.  The pertinent results include: No electrolyte abnormalities.  Patient with elevated BUN of 61 which seems to be double patient's baseline with creatinine of 1.7 which seems to be near patient's baseline.  Indicative of GI bleed most likely upper.  No transaminitis.  Mild leukocytosis of 11.  Thrombocytopenia of 148 of which seems to be near patient's baseline.  Patient with evidence of mild anemia with hemoglobin 12.9 of which seems to be near patient's baseline.  Fecal occult blood negative.   Imaging Studies ordered:  N/a   Cardiac Monitoring: / EKG:  The patient was maintained on a cardiac monitor.  I personally viewed and interpreted the cardiac monitored which showed an underlying rhythm of: Sinus rhythm   Consultations Obtained:  I requested consultation with attending physician Dr. Jodi MourningZavitz who was in agreement with treatment plan going forward.   Problem List / ED Course / Critical interventions / Medication management  Rectal bleeding Reevaluation of the patient showed that the patient stayed the same I have reviewed the patients  home medicines and have made adjustments as needed   Social Determinants of Health:  Denies tobacco, illicit drug use   Test / Admission - Considered:  Rectal bleeding Vitals signs within normal range and stable throughout visit. Laboratory studies significant for: See above 52 year old male presents with 1 episode of bright red blood per rectum.  No evidence of gross exsanguination.  Appreciable blood on rectal exam but with negative occult.  Patient with stable vital signs, no repeat bouts of hematochezia, without rectal pain or abdominal pain since prior to bowel movement.  Patient deemed safe for discharge with close follow-up with GI outpatient with strict return precautions regarding continued bleeding, fever, any rectal/abdominal pain.  In the meantime, MiraLAX recommended titration for soft, regular bowel movements.  Treatment plan discussed at length with patient and he acknowledged understanding was agreeable to said plan. Treatment plan were discussed at length with patient and they knowledge understanding was agreeable to said plan.  Appropriate consultations were made as described in the ED course.  Patient was stable upon admission to the hospital.,        Final Clinical Impression(s) / ED Diagnoses Final diagnoses:  Rectal bleeding    Rx / DC Orders ED Discharge Orders     None         Peter GarterRobbins, Keefe Zawistowski A, [place] 11/08/22 1451    Blane OharaZavitz, Joshua, MD 11/13/22 [phone]

## 2022-11-10 MED ORDER — GLIPIZIDE ER 5 MG PO TB24: 5.0000 mg | ORAL_TABLET | Freq: Every day | ORAL | 3 refills | Status: DC

## 2022-11-10 NOTE — Addendum Note (Signed)
Addended by: Abbe Amsterdam C on: 11/10/2022 03:48 PM   Modules accepted: Orders

## 2022-11-12 ENCOUNTER — Other Ambulatory Visit: Payer: Self-pay

## 2022-11-12 ENCOUNTER — Emergency Department (HOSPITAL_BASED_OUTPATIENT_CLINIC_OR_DEPARTMENT_OTHER)
Admission: EM | Admit: 2022-11-12 | Discharge: 2022-11-12 | Disposition: A | Discharge status: Home / Self Care | Payer: No Typology Code available for payment source | Attending: Emergency Medicine | Admitting: Emergency Medicine

## 2022-11-12 ENCOUNTER — Encounter (HOSPITAL_BASED_OUTPATIENT_CLINIC_OR_DEPARTMENT_OTHER): Payer: Self-pay

## 2022-11-12 ENCOUNTER — Emergency Department (HOSPITAL_BASED_OUTPATIENT_CLINIC_OR_DEPARTMENT_OTHER): Payer: No Typology Code available for payment source

## 2022-11-12 DIAGNOSIS — I110 Hypertensive heart disease with heart failure: Secondary | ICD-10-CM | POA: Diagnosis not present

## 2022-11-12 DIAGNOSIS — I5022 Chronic systolic (congestive) heart failure: Secondary | ICD-10-CM | POA: Insufficient documentation

## 2022-11-12 DIAGNOSIS — L97529 Non-pressure chronic ulcer of other part of left foot with unspecified severity: Secondary | ICD-10-CM | POA: Diagnosis not present

## 2022-11-12 DIAGNOSIS — E11621 Type 2 diabetes mellitus with foot ulcer: Secondary | ICD-10-CM | POA: Insufficient documentation

## 2022-11-12 DIAGNOSIS — Z7982 Long term (current) use of aspirin: Secondary | ICD-10-CM | POA: Insufficient documentation

## 2022-11-12 DIAGNOSIS — Z7901 Long term (current) use of anticoagulants: Secondary | ICD-10-CM | POA: Diagnosis not present

## 2022-11-12 DIAGNOSIS — I2510 Atherosclerotic heart disease of native coronary artery without angina pectoris: Secondary | ICD-10-CM | POA: Diagnosis not present

## 2022-11-12 DIAGNOSIS — Z794 Long term (current) use of insulin: Secondary | ICD-10-CM | POA: Insufficient documentation

## 2022-11-12 DIAGNOSIS — Z8673 Personal history of transient ischemic attack (TIA), and cerebral infarction without residual deficits: Secondary | ICD-10-CM | POA: Diagnosis not present

## 2022-11-12 LAB — BASIC METABOLIC PANEL
Anion gap: 10 (ref 5–15)
BUN: 53 mg/dL — ABNORMAL HIGH (ref 6–20)
CO2: 24 mmol/L (ref 22–32)
Calcium: 9 mg/dL (ref 8.9–10.3)
Chloride: 104 mmol/L (ref 98–111)
Creatinine, Ser: 1.66 mg/dL — ABNORMAL HIGH (ref 0.61–1.24)
GFR, Estimated: 49 mL/min — ABNORMAL LOW (ref >60)
Glucose, Bld: 269 mg/dL — ABNORMAL HIGH (ref 70–99)
Potassium: 3.6 mmol/L (ref 3.5–5.1)
Sodium: 138 mmol/L (ref 135–145)

## 2022-11-12 LAB — CBC WITH DIFFERENTIAL/PLATELET
Abs Immature Granulocytes: 0.05 10*3/uL (ref 0.00–0.07)
Basophils Absolute: 0 10*3/uL (ref 0.0–0.1)
Basophils Relative: 0 %
Eosinophils Absolute: 0.1 10*3/uL (ref 0.0–0.5)
Eosinophils Relative: 1 %
HCT: 36.9 % — ABNORMAL LOW (ref 39.0–52.0)
Hemoglobin: 12.5 g/dL — ABNORMAL LOW (ref 13.0–17.0)
Immature Granulocytes: 0 %
Lymphocytes Relative: 16 %
Lymphs Abs: 1.8 10*3/uL (ref 0.7–4.0)
MCH: 30.5 pg (ref 26.0–34.0)
MCHC: 33.9 g/dL (ref 30.0–36.0)
MCV: 90 fL (ref 80.0–100.0)
Monocytes Absolute: 0.8 10*3/uL (ref 0.1–1.0)
Monocytes Relative: 7 %
Neutro Abs: 8.4 10*3/uL — ABNORMAL HIGH (ref 1.7–7.7)
Neutrophils Relative %: 76 %
Platelets: 133 10*3/uL — ABNORMAL LOW (ref 150–400)
RBC: 4.1 MIL/uL — ABNORMAL LOW (ref 4.22–5.81)
RDW: 12.1 % (ref 11.5–15.5)
WBC: 11.2 10*3/uL — ABNORMAL HIGH (ref 4.0–10.5)
nRBC: 0 % (ref 0.0–0.2)

## 2022-11-12 NOTE — ED Triage Notes (Signed)
Pt POV from home reporting bleeding from L big toe and second toe wounds. Diabetic, seen for same 3 weeks ago, was told he needed a diabetic shoe to prevent wounds. Endorses yellow drainage from wounds. R BKA, NAD noted at this time

## 2022-11-12 NOTE — ED Provider Notes (Signed)
[place] EMERGENCY DEPARTMENT AT [facility] Provider Note   CSN: [id number] Arrival date & time: 11/12/22  0122     History  Chief Complaint  Patient presents with   Wound Check    Mitchell [name] is a 52 y.o. male.  HPI     This is a 52 year old male who presents with concern for wounds on his left great and second toe.  Patient reports he has had wounds on his left great and second toe for some time.  He follows with Dr. Lajoyce Corners.  He has been advised to get some diabetic shoes but is unable to get these.  He has noted drainage and bleeding from the wounds tonight.  No fevers.  Denies systemic symptoms.  Home Medications Prior to Admission medications   Medication Sig Start Date End Date Taking? Authorizing Provider  Accu-Chek Softclix Lancets lancets Use to test blood sugars up to 4 times daily as needed. 12/08/21   Orland Mustard, MD  acetaminophen-codeine (TYLENOL #3) 300-30 MG tablet Take 1 tablet by mouth every 6 (six) hours as needed for moderate pain. 06/15/22   [name], Gwenlyn Found, MD  amitriptyline (ELAVIL) 10 MG tablet Take 1 tablet (10 mg total) by mouth at bedtime. 07/26/22   [name], Gwenlyn Found, MD  Ascorbic Acid (VITAMIN C PO) Take 1 tablet by mouth daily.    [provider]  aspirin 81 MG EC tablet Take 1 tablet (81 mg total) by mouth daily. 04/13/21   Danford, Earl Lites, MD  blood glucose meter kit and supplies Dispense based on patient and insurance preference. Use up to four times daily as directed. (FOR ICD-10 E10.9, E11.9). 06/15/22   [name], Gwenlyn Found, MD  Blood Glucose Monitoring Suppl (BLOOD GLUCOSE MONITOR SYSTEM) w/Device KIT Use to test blood sugar up to 4 times daily as needed. 12/08/21   Arby Barrette, MD  carvedilol (COREG) 6.25 MG tablet Take 1 tablet (6.25 mg total) by mouth 2 (two) times daily with a meal. 10/30/22   Bensimhon, [name], MD  Cholecalciferol (VITAMIN D-3 PO) Take 1 capsule by mouth daily.    [provider]   clopidogrel (PLAVIX) 75 MG tablet Take 1 tablet (75 mg total) by mouth daily. 07/26/22   Bensimhon, [name], MD  gabapentin (NEURONTIN) 100 MG capsule Take 1 capsule (100 mg total) by mouth 2 (two) times daily. 07/26/22   [name], Gwenlyn Found, MD  glipiZIDE (GLUCOTROL XL) 5 MG 24 hr tablet Take 1 tablet (5 mg total) by mouth daily with breakfast. Take with first meal of the day 11/10/22   [name], Gwenlyn Found, MD  glucose blood (ACCU-CHEK GUIDE) test strip Use to test blood sugars up to 4 times daily as needed. 12/08/21   Orland Mustard, MD  Insulin Glargine [facility]) 100 UNIT/ML Inject 42 Units into the skin daily. 09/29/22   [name], Gwenlyn Found, MD  methocarbamol (ROBAXIN) 500 MG tablet Take 1 tablet (500 mg total) by mouth every 6 (six) hours as needed for muscle spasms. 07/26/22   [name], Gwenlyn Found, MD  pantoprazole (PROTONIX) 40 MG tablet Take 1 tablet (40 mg total) by mouth daily as needed (acid reflux). 02/13/22   [name], Gwenlyn Found, MD  potassium chloride SA (KLOR-CON M) 20 MEQ tablet Take 1 tablet (20 mEq total) by mouth daily. 05/05/22   Milford, Anderson Malta, FNP  rosuvastatin (CRESTOR) 40 MG tablet Take 1 tablet (40 mg total) by mouth daily. 07/26/22   Bensimhon, [name], MD  sacubitril-valsartan (  ENTRESTO) 49-51 MG Take 1 tablet by mouth 2 (two) times daily. 10/28/22   Albertine Grates, MD  spironolactone (ALDACTONE) 25 MG tablet Take 0.5 tablets (12.5 mg total) by mouth daily. 10/27/22   Albertine Grates, MD  tamsulosin (FLOMAX) 0.4 MG CAPS capsule Take 1 capsule (0.4 mg total) by mouth daily after supper. 10/27/22   Albertine Grates, MD  torsemide (DEMADEX) 20 MG tablet Take 1 tablet (20 mg total) by mouth daily. 10/28/22   Albertine Grates, MD      Allergies    Marcelline Deist [dapagliflozin], Nsaids, and Trulicity [dulaglutide]    Review of Systems   Review of Systems  Constitutional:  Negative for fever.  Skin:  Positive for wound.  All other systems reviewed and are negative.   Physical Exam Updated Vital Signs BP  114/70   Pulse 88   Temp 98.7 F (37.1 C) (Oral)   Resp 18   Ht 1.829 m (6')   Wt 86 kg   SpO2 98%   BMI 25.71 kg/m  Physical Exam Vitals and nursing note reviewed.  Constitutional:      Appearance: He is well-developed. He is obese. He is not ill-appearing.  HENT:     Head: Normocephalic and atraumatic.  Eyes:     Pupils: Pupils are equal, round, and reactive to light.  Cardiovascular:     Rate and Rhythm: Normal rate and regular rhythm.     Heart sounds: Normal heart sounds. No murmur heard. Pulmonary:     Effort: Pulmonary effort is normal. No respiratory distress.     Breath sounds: Normal breath sounds.  Abdominal:     Palpations: Abdomen is soft.     Tenderness: There is no abdominal tenderness. There is no rebound.  Musculoskeletal:     Cervical back: Neck supple.     Comments: Right BKA Erosion over the anterior portion of the first and second digit of the left foot, no adjacent erythema, no purulence, no fluctuance, 1+ DP pulse  Skin:    General: Skin is warm and dry.  Neurological:     Mental Status: He is alert and oriented to person, place, and time.     ED Results / Procedures / Treatments   Labs (all labs ordered are listed, but only abnormal results are displayed) Labs Reviewed  BASIC METABOLIC PANEL - Abnormal; Notable for the following components:      Result Value   Glucose, Bld 269 (*)    BUN 53 (*)    Creatinine, Ser 1.66 (*)    GFR, Estimated 49 (*)    All other components within normal limits  CBC WITH DIFFERENTIAL/PLATELET - Abnormal; Notable for the following components:   WBC 11.2 (*)    RBC 4.10 (*)    Hemoglobin 12.5 (*)    HCT 36.9 (*)    Platelets 133 (*)    Neutro Abs 8.4 (*)    All other components within normal limits    EKG None  Radiology DG Foot Complete Left  Result Date: 11/12/2022 CLINICAL DATA:  Toe wounds.  Bleeding from big toe and second toe. EXAM: LEFT FOOT - COMPLETE 3+ VIEW COMPARISON:  10/09/2022. FINDINGS:  There is no evidence of fracture or dislocation. No periosteal elevation or bony erosion. Degenerative changes are present in the first metatarsophalangeal joint and midfoot. Large calcaneal spur. Soft tissues are unremarkable. IMPRESSION: No acute osseous abnormality. Electronically Signed   By: Thornell Sartorius M.D.   On: 11/12/2022 02:20    Procedures  Procedures    Medications Ordered in ED Medications - No data to display  ED Course/ Medical Decision Making/ A&P                             Medical Decision Making Amount and/or Complexity of Data Reviewed Labs: ordered. Radiology: ordered.   This patient presents to the ED for concern of wounds, this involves an extensive number of treatment options, and is a complaint that carries with it a high risk of complications and morbidity.  I considered the following differential and admission for this acute, potentially life threatening condition.  The differential diagnosis includes diabetic ulcers, infection, osteomyelitis  MDM:    This is a 52 year old male who presents with concern for bleeding from prior wounds.  He is nontoxic and vital signs are reassuring.  He denies systemic symptoms.  His wounds appear chronic.  They do not appear acutely infected.  No active bleeding noted.  X-rays do not show any bony involvement.  Basic labs appear at the patient's baseline.  White count 11.2 which is similar to prior.  At this time I do not feel that the wounds are acutely infected.  I did advise patient that he would be better off in a postoperative shoe or boot which he has.  He needs to follow-up closely with Dr. Lajoyce Corners on Monday.  (Labs, imaging, consults)  Labs: I Ordered, and personally interpreted labs.  The pertinent results include: CBC, BMP  Imaging Studies ordered: I ordered imaging studies including x-ray I independently visualized and interpreted imaging. I agree with the radiologist interpretation  Additional history obtained  from chart review.  External records from outside source obtained and reviewed including prior evaluations  Cardiac Monitoring: The patient was not maintained on a cardiac monitor.  If on the cardiac monitor, I personally viewed and interpreted the cardiac monitored which showed an underlying rhythm of: N/A  Reevaluation: After the interventions noted above, I reevaluated the patient and found that they have :stayed the same  Social Determinants of Health:  lives independently, multiple chronic medical problems  Disposition: Discharge  Co morbidities that complicate the patient evaluation  Past Medical History:  Diagnosis Date   Bipolar disorder    CAD (coronary artery disease)    a. diffuse 3v CAD by cath 2019, medical therapy recommended.   Cataract    forming    Chronic systolic CHF (congestive heart failure)    CVA (cerebral infarction)    No residual deficits   Depression    PTSD,    Diabetes mellitus    TYPE II; UNCONTROLLED BY HEMOGLOBIN A1c; STABLE AS  PER DISCHARGE   Headache(784.0)    Herpes simplex of male genitalia    History of colonic polyps    Hyperlipidemia    Hypertension    Myocardial infarction 1987   (while playing football)   Obesity    OSA (obstructive sleep apnea)    repeat study 2018 without significant OSA   Pneumonia    Post-cardiac injury syndrome    History of cardiac injury from blunt trauma   Pulmonary hypertension    a. moderately elevated PASP 07/2019.   PVCs (premature ventricular contractions)    Schizophrenia    Goes to [facility]   Sleep apnea    Stroke 2005   some left side weakness   Syncope    Recurrent, thought to be vasovagal. Also has h/o frequent PVCs.  Medicines No orders of the defined types were placed in this encounter.   I have reviewed the patients home medicines and have made adjustments as needed  Problem List / ED Course: Problem List Items Addressed This Visit   None Visit  Diagnoses     Diabetic ulcer of toe of left foot associated with type 2 diabetes mellitus, unspecified ulcer stage    -  Primary                   Final Clinical Impression(s) / ED Diagnoses Final diagnoses:  Diabetic ulcer of toe of left foot associated with type 2 diabetes mellitus, unspecified ulcer stage    Rx / DC Orders ED Discharge Orders     None         Shon Baton, MD 11/12/22 [phone]

## 2022-11-12 NOTE — Discharge Instructions (Signed)
You were seen today for bleeding from wounds on your left foot.  They do not appear acutely infected.  It is very important that you follow the instructions by Dr. Lajoyce Corners and obtain appropriate footwear.

## 2022-11-12 NOTE — ED Notes (Signed)
Unable to get IV access when labs drawn

## 2022-11-13 ENCOUNTER — Ambulatory Visit (INDEPENDENT_AMBULATORY_CARE_PROVIDER_SITE_OTHER): Payer: No Typology Code available for payment source | Admitting: Orthopedic Surgery

## 2022-11-13 ENCOUNTER — Telehealth (HOSPITAL_COMMUNITY): Payer: Self-pay

## 2022-11-13 ENCOUNTER — Encounter: Payer: Self-pay | Admitting: Orthopedic Surgery

## 2022-11-13 DIAGNOSIS — L97521 Non-pressure chronic ulcer of other part of left foot limited to breakdown of skin: Secondary | ICD-10-CM

## 2022-11-13 DIAGNOSIS — S91105A Unspecified open wound of left lesser toe(s) without damage to nail, initial encounter: Secondary | ICD-10-CM | POA: Diagnosis not present

## 2022-11-13 NOTE — Progress Notes (Signed)
Advanced [facility] Clinic Note  PCP: Abbe Amsterdam, MD HF Cardiologist: Dr. Gala Romney  HPI: Mitchell [name] is a 52 y.o.male with multiple medical problems HFrEF, mixed ischemic/nonischemic CM, 3v CAD, obesity, hypertension, type II DM, obstructive sleep apnea, bipolar disease/schizophrenia, prior CVA.    Had [facility] on 09/26/17 with 3v disease and preserved CO/CI. No revascularization options. Saw EP about ICD but did not f/u.   Patient admitted June 2022 with sepsis secondary to RLE abscess/cellulitis and COVID-19. He ended up requiring R BKA.  Readmitted 04/11/21 with a/c HFrEF and NSTEMI.  He was diuresed with IV lasix and started on GDMT. L/RHC with severe 3 vessel CAD (no targets for intervention), RA 4, PA 49/17 (31), PCW 15, Fick CO/index 6.7/2.7. He was referred to paramedicine program at discharge. Visual impairment felt to be contributor to issues with medication compliance.  Echo 2/23, EF 30-35%, RV ok. IVC not dilated. Seen at Urgent Care 2/23 and treated for simple UTI, UA+, CT w/ no evidence of pyelo or kidney stone.  Follow up 5/23, doing well, volume ok. Referred for ICD.   Seen in ED 12/08/21 for near syncope, ? hypoglycemic episode vs arrhythmia. Frequent PVCs on tele and Zio AT 14 day placed to quantify. Patient requested to schedule ICD implant.  S/p MDT ICD 6/23.  Seen in ED 03/05/22 for SOB, given IV lasix and then left. Post ED follow up 8/23, he remained volume up and several med discrepancies. Started torsemide 60 mg daily but later decreased to 20 mg daily with increase in SCr. Re-enrolled in paramedicine.  Today he returns for HF follow. Had work accident on 06/12/22 and cut R middle finger severely so has been out of work - went back to day. Denies CP or SOB. No edema, orthopnea or PND. Compliant with meds. Follows with paramedicine ([facility])    Cardiac Testing: - R/LHC (9/22):   Prox LAD to Mid LAD lesion is 80% stenosed.   Dist LAD lesion is 99%  stenosed.   Dist RCA lesion is 90% stenosed.   Ost 1st Mrg lesion is 95% stenosed.   1st Mrg lesion is 99% stenosed.   Ost 2nd Mrg to 2nd Mrg lesion is 99% stenosed.   Ost RPDA to RPDA lesion is 90% stenosed.   RPAV lesion is 95% stenosed.   Mid LM lesion is 20% stenosed.  Findings: Ao = 131/84 (107) LV = 131/22 RA = 4 RV = 49/11 PA = 49/17 (31) PCW = 15 Fick cardiac output/index = 6.7/2.7 PVR = 2.4 FA sat = 95% PA sat = 56%, 63%  Assessment: Severe 3v diffuse CAD with moderate progression from previous Ischemic CM EF 25% Mild pulmonary venous HTN with preserved cardiac output   - Echo (6/22): EF 20-25%, mild LVH, trivial MR  ROS: All systems negative except as listed in HPI, PMH and Problem List.  SH:  Social History   Socioeconomic History   Marital status: Married    Spouse name: Not on file   Number of children: 5   Years of education: Not on file   Highest education level: Not on file  Occupational History   Occupation: Mortician  Tobacco Use   Smoking status: Never   Smokeless tobacco: Never  Vaping Use   Vaping Use: Never used  Substance and Sexual Activity   Alcohol use: No   Drug use: No   Sexual activity: Not on file  Other Topics Concern   Not on file  Social History  Narrative   MARRIED, LIVES IN [place] WITH WIFE; GREW UP IN [place] AND USED TO BE A COOK; HE ENJOYS COOKING AND ENJOYS EATING A LOT OF PORK AND SALT.   Social Determinants of Health   Financial Resource Strain: Medium Risk (04/13/2021)   Overall Financial Resource Strain (CARDIA)    Difficulty of Paying Living Expenses: Somewhat hard  Food Insecurity: No Food Insecurity (04/13/2021)   Hunger Vital Sign    Worried About Running Out of Food in the Last Year: Never true    Ran Out of Food in the Last Year: Never true  Transportation Needs: No Transportation Needs (04/13/2021)   PRAPARE - Administrator, Civil Service (Medical): No    Lack of Transportation  (Non-Medical): No  Physical Activity: Not on file  Stress: Not on file  Social Connections: Not on file  Intimate Partner Violence: Not on file   FH:  Family History  Problem Relation Age of Onset   Heart disease Mother        MI   Heart failure Mother    Diabetes Mother        ALSO IN MOST OF HIS SIBLINGS; 2 UNLCES HAVE ALSO PASSED AWAY FROM DM   Cardiomyopathy Mother    Cancer - Ovarian Mother    Ovarian cancer Mother    Heart disease Father    Hypertension Father    Diabetes Father    Diabetes Sister    Diabetes Brother    Colon cancer Paternal Uncle    Colon cancer Paternal Uncle    Colon polyps Neg Hx    Esophageal cancer Neg Hx    Rectal cancer Neg Hx    Stomach cancer Neg Hx    Past Medical History:  Diagnosis Date   Bipolar disorder    CAD (coronary artery disease)    a. diffuse 3v CAD by cath 2019, medical therapy recommended.   Cataract    forming    Chronic systolic CHF (congestive heart failure)    CVA (cerebral infarction)    No residual deficits   Depression    PTSD,    Diabetes mellitus    TYPE II; UNCONTROLLED BY HEMOGLOBIN A1c; STABLE AS  PER DISCHARGE   Headache(784.0)    Herpes simplex of male genitalia    History of colonic polyps    Hyperlipidemia    Hypertension    Myocardial infarction 1987   (while playing football)   Obesity    OSA (obstructive sleep apnea)    repeat study 2018 without significant OSA   Pneumonia    Post-cardiac injury syndrome    History of cardiac injury from blunt trauma   Pulmonary hypertension    a. moderately elevated PASP 07/2019.   PVCs (premature ventricular contractions)    Schizophrenia    Goes to [facility]   Sleep apnea    Stroke 2005   some left side weakness   Syncope    Recurrent, thought to be vasovagal. Also has h/o frequent PVCs.    Current Outpatient Medications  Medication Sig Dispense Refill   Accu-Chek Softclix Lancets lancets Use to test blood sugars up to 4 times  daily as needed. 100 each 0   acetaminophen-codeine (TYLENOL #3) 300-30 MG tablet Take 1 tablet by mouth every 6 (six) hours as needed for moderate pain. 30 tablet 0   amitriptyline (ELAVIL) 10 MG tablet Take 1 tablet (10 mg total) by mouth at bedtime. 90 tablet 2  Ascorbic Acid (VITAMIN C PO) Take 1 tablet by mouth daily.     aspirin 81 MG EC tablet Take 1 tablet (81 mg total) by mouth daily. 30 tablet 12   blood glucose meter kit and supplies Dispense based on patient and insurance preference. Use up to four times daily as directed. (FOR ICD-10 E10.9, E11.9). 1 each 0   Blood Glucose Monitoring Suppl (BLOOD GLUCOSE MONITOR SYSTEM) w/Device KIT Use to test blood sugar up to 4 times daily as needed. 1 kit 0   carvedilol (COREG) 6.25 MG tablet Take 1 tablet (6.25 mg total) by mouth 2 (two) times daily with a meal. 180 tablet 3   Cholecalciferol (VITAMIN D-3 PO) Take 1 capsule by mouth daily.     clopidogrel (PLAVIX) 75 MG tablet Take 1 tablet (75 mg total) by mouth daily. 90 tablet 3   gabapentin (NEURONTIN) 100 MG capsule Take 1 capsule (100 mg total) by mouth 2 (two) times daily. 60 capsule 3   glipiZIDE (GLUCOTROL XL) 5 MG 24 hr tablet Take 1 tablet (5 mg total) by mouth daily with breakfast. Take with first meal of the day 90 tablet 3   glucose blood (ACCU-CHEK GUIDE) test strip Use to test blood sugars up to 4 times daily as needed. 100 each 0   Insulin Glargine (BASAGLAR KWIKPEN) 100 UNIT/ML Inject 42 Units into the skin daily. 15 mL 3   methocarbamol (ROBAXIN) 500 MG tablet Take 1 tablet (500 mg total) by mouth every 6 (six) hours as needed for muscle spasms. 30 tablet 2   pantoprazole (PROTONIX) 40 MG tablet Take 1 tablet (40 mg total) by mouth daily as needed (acid reflux). 30 tablet 6   potassium chloride SA (KLOR-CON M) 20 MEQ tablet Take 1 tablet (20 mEq total) by mouth daily. 90 tablet 2   rosuvastatin (CRESTOR) 40 MG tablet Take 1 tablet (40 mg total) by mouth daily. 90 tablet 3    sacubitril-valsartan (ENTRESTO) 49-51 MG Take 1 tablet by mouth 2 (two) times daily. 60 tablet 0   spironolactone (ALDACTONE) 25 MG tablet Take 0.5 tablets (12.5 mg total) by mouth daily. 90 tablet 3   tamsulosin (FLOMAX) 0.4 MG CAPS capsule Take 1 capsule (0.4 mg total) by mouth daily after supper. 30 capsule 0   torsemide (DEMADEX) 20 MG tablet Take 1 tablet (20 mg total) by mouth daily. 180 tablet 11   No current facility-administered medications for this visit.   Wt Readings from Last 3 Encounters:  11/12/22 86 kg (189 lb 9.5 oz)  11/08/22 86.2 kg (190 lb)  10/31/22 127.8 kg (281 lb 12 oz)   There were no vitals taken for this visit.  PHYSICAL EXAM: General:  Well appearing. No resp difficulty HEENT: normal Neck: supple. no JVD. Carotids 2+ bilat; no bruits. No lymphadenopathy or thryomegaly appreciated. Cor: PMI nondisplaced. Regular rate & rhythm. No rubs, gallops or murmurs. Lungs: clear Abdomen: obese soft, nontender, nondistended. No hepatosplenomegaly. No bruits or masses. Good bowel sounds. Extremities: no cyanosis, clubbing, rash, edema  bandage on R middle finger s/p R BKA Neuro: alert & orientedx3, cranial nerves grossly intact. moves all 4 extremities w/o difficulty. Affect pleasant   Device interrogation (personally reviewed): Optivol ok. No VT/AF. Activity level 1hr/day Personally reviewed   ASSESSMENT & PLAN:  Chronic Systolic HF /Mixed ischemic/nonischemic cardiomyopathy  - Echo (6/22): EF 20 to 25%, stable from prior exams dating back to 78  - R/LHC (9/22): Severe 3v CAD (no targets for revascularization), mild  pulm venous HTN with preserved CO. - Echo (2/23): EF 30-35%, RV normal. IVC not dilated  - He has LBBB but QRS <150 ms. Not candidate for CRT-D.  - s/p MDT ICD - NYHA II stable. Volume status  - Continue torsemide 40 mg daily. - Continue Coreg 6.25 mg bid. - Continue Entresto 97/103 mg bid, stressed he needs to take bid. - Continue spiro 25 mg  daily. - He is off [place] due to nausea, and does not want to re-challenge. - Labs today   2. Frequent PVCs - Zio AT 5/23 showed mostly NSR, no high grade arrhythmias. PVCs 3.8% - Asymptomatic. - Labs today.   3. CAD/NSTEMI 09/22 - Cath with progressive 3v CAD as above. No targets for intervention.  - No s/s angina  - Continue DAPT with aspirin + plavix, statin and beta blocker.    4. Hypertension - Blood pressure well controlled. Continue current regimen.   5. Hyperlipidemia  - Continue statin. - No changes.   6. DM2 - Last A1c 7.8  - On insulin, management per PCP.   7. PVD s/p R BKA  - Diabetic foot wound complicated by sepsis that led to amputation on 01/25/2021 - Continue aspirin and statin.   8. SDOH - Continue paramedicine to help with medication compliance.   Jacklynn Ganong, MD 11/13/22

## 2022-11-13 NOTE — Telephone Encounter (Signed)
Called to confirm/remind patient of their appointment at the [facility] on 11/14/22.   Patient reminded to bring all medications and/or complete list.  Confirmed patient has transportation. Gave directions, instructed to utilize valet parking.  Confirmed appointment prior to ending call.

## 2022-11-13 NOTE — Progress Notes (Signed)
Office Visit Note   Patient: Mitchell [name]           Date of Birth: 04-26-1971           MRN: [id number] Visit Date: 11/13/2022              Requested by: Pearline Cables, MD [address] [place],  [place] [address] PCP: Pearline Cables, MD  Chief Complaint  Patient presents with   Left Foot - Pain    F/u ER wound       HPI: Patient is a 52 year old gentleman who presents with ulcerations over the PIP joints of the toes left foot.  Patient went to the emergency room yesterday for these wounds radiographs shows no evidence of bone infection.  Patient's white cell count was 11.2.  No significant change in his blood sugars.  Patient complains of pain with weightbearing and this shows an woke up with bleeding over his toes.  Patient's last ankle-brachial indices show triphasic flow March 2021.  Assessment & Plan: Visit Diagnoses:  1. Open wound of lesser toe of left foot   2. Foot ulcer, left, limited to breakdown of skin     Plan: Patient has thready palpable dorsalis pedis pulse.  Will set him up for ankle-brachial indices.  Nails were trimmed x 4 on the left foot.  Patient will continue with the postoperative shoe until follow-up continue with cleansing with soap and water gauze and an Ace wrap.  Do not wear his new balance sneakers until these wounds have healed.  Follow-Up Instructions: Return in about 4 weeks (around 12/11/2022).   Ortho Exam  Patient is alert, oriented, no adenopathy, well-dressed, normal affect, normal respiratory effort. Examination patient has thready palpable dorsalis pedis pulse.  The Doppler is not working.  I cannot check a waveform.  Patient has clawing of the lesser toes that has led to the abrasion on the dorsum of the PIP joint.  Patient has superficial ulcers over the PIP joints of the toes left foot secondary to rubbing in shoe wear.  He has onychomycotic nails that were trimmed x 4.  Patient is a stable right transtibial  amputation.  Imaging: No results found. No images are attached to the encounter.  Labs: Lab Results  Component Value Date   HGBA1C 10.2 (H) 10/25/2022   HGBA1C 11.8 (H) 06/15/2022   HGBA1C 7.8 (A) 08/19/2021   ESRSEDRATE 20 (H) 03/08/2022   ESRSEDRATE 81 (H) 10/21/2019   ESRSEDRATE 25 (H) 06/17/2018   CRP 10.3 (H) 01/28/2021   CRP 12.8 (H) 01/27/2021   CRP 21.1 (H) 01/26/2021   LABURIC 10.9 (H) 10/23/2022   REPTSTATUS 10/28/2022 FINAL 10/23/2022   GRAMSTAIN  08/29/2018    RARE WBC PRESENT, PREDOMINANTLY PMN NO ORGANISMS SEEN    CULT  10/23/2022    NO GROWTH 5 DAYS Performed at [facility], [address], [place], [place] [address]    LABORGA KLEBSIELLA PNEUMONIAE 05/21/2018   LABORGA ENTEROBACTER SPECIES 05/21/2018   LABORGA STAPHYLOCOCCUS AUREUS 05/21/2018     Lab Results  Component Value Date   ALBUMIN 4.4 11/08/2022   ALBUMIN 3.6 10/23/2022   ALBUMIN 4.2 10/21/2022   PREALBUMIN 6.8 (L) 10/21/2019    Lab Results  Component Value Date   MG 2.3 11/29/2021   MG 1.5 (L) 04/12/2021   MG 1.8 02/16/2021   Lab Results  Component Value Date   VD25OH 11 (L) 08/16/2009    Lab Results  Component Value Date   PREALBUMIN 6.8 (L) 10/21/2019      Latest Ref Rng & Units 11/12/2022    2:03 AM 11/08/2022   12:27 PM 10/26/2022    3:17 AM  CBC EXTENDED  WBC 4.0 - 10.5 K/uL 11.2  11.0  7.9   RBC 4.22 - 5.81 MIL/uL 4.10  4.32  3.81   Hemoglobin 13.0 - 17.0 g/dL 16.1  09.6  04.5   HCT 39.0 - 52.0 % 36.9  39.0  34.6   Platelets 150 - 400 K/uL 133  148  128   NEUT# 1.7 - 7.7 K/uL 8.4   5.8   Lymph# 0.7 - 4.0 K/uL 1.8   1.4      There is no height or weight on file to calculate BMI.  Orders:  Orders Placed This Encounter  Procedures   VAS [place] ABI WITH/WO TBI   No orders of the defined types were placed in this encounter.    Procedures: No procedures performed  Clinical Data: No additional findings.  ROS:  All other systems negative, except as  noted in the HPI. Review of Systems  Objective: Vital Signs: There were no vitals taken for this visit.  Specialty Comments:  No specialty comments available.  PMFS History: Patient Active Problem List   Diagnosis Date Noted   UTI (urinary tract infection) 10/23/2022   ICD (implantable cardioverter-defibrillator) in place 04/19/2022   Near syncope 12/08/2021   CKD (chronic kidney disease) stage 3, GFR 30-59 ml/min 12/08/2021   PVD s/p R LE amputation  12/08/2021   Diabetic retinopathy associated with type 2 diabetes mellitus 06/06/2021   CAD (coronary artery disease) with flat troponin  04/11/2021   S/P below knee amputation, right 02/07/2021   AKI (acute kidney injury) 01/23/2021   QT prolongation 07/30/2019   Acute on chronic combined systolic and diastolic CHF (congestive heart failure) 07/29/2019   Stroke 05/08/2017   Uncontrolled type 2 diabetes mellitus with hyperglycemia, with long-term current use of insulin 12/07/2015   Chronic systolic heart failure 10/02/2011   Erectile dysfunction 10/02/2011   Obstructive sleep apnea 10/11/2007   HLD (hyperlipidemia) 10/10/2007   HYPOKALEMIA 10/10/2007   Obesity, Class III, BMI 40-49.9 (morbid obesity) 10/10/2007   Essential hypertension 10/10/2007   PREMATURE VENTRICULAR CONTRACTIONS 10/10/2007   COLONIC POLYPS, HX OF 10/10/2007   Past Medical History:  Diagnosis Date   Bipolar disorder    CAD (coronary artery disease)    a. diffuse 3v CAD by cath 2019, medical therapy recommended.   Cataract    forming    Chronic systolic CHF (congestive heart failure)    CVA (cerebral infarction)    No residual deficits   Depression    PTSD,    Diabetes mellitus    TYPE II; UNCONTROLLED BY HEMOGLOBIN A1c; STABLE AS  PER DISCHARGE   Headache(784.0)    Herpes simplex of male genitalia    History of colonic polyps    Hyperlipidemia    Hypertension    Myocardial infarction 1987   (while playing football)   Obesity    OSA  (obstructive sleep apnea)    repeat study 2018 without significant OSA   Pneumonia    Post-cardiac injury syndrome    History of cardiac injury from blunt trauma   Pulmonary hypertension    a. moderately elevated PASP 07/2019.   PVCs (premature ventricular contractions)    Schizophrenia    Goes to [facility]   Sleep apnea    Stroke  2005   some left side weakness   Syncope    Recurrent, thought to be vasovagal. Also has h/o frequent PVCs.     Family History  Problem Relation Age of Onset   Heart disease Mother        MI   Heart failure Mother    Diabetes Mother        ALSO IN MOST OF HIS SIBLINGS; 2 UNLCES HAVE ALSO PASSED AWAY FROM DM   Cardiomyopathy Mother    Cancer - Ovarian Mother    Ovarian cancer Mother    Heart disease Father    Hypertension Father    Diabetes Father    Diabetes Sister    Diabetes Brother    Colon cancer Paternal Uncle    Colon cancer Paternal Uncle    Colon polyps Neg Hx    Esophageal cancer Neg Hx    Rectal cancer Neg Hx    Stomach cancer Neg Hx     Past Surgical History:  Procedure Laterality Date   AMPUTATION Right 01/25/2021   Procedure: RIGHT BELOW KNEE AMPUTATION;  Surgeon: Nadara Mustard, MD;  Location: [facility] OR;  Service: Orthopedics;  Laterality: Right;   CARDIAC CATHETERIZATION  12/19/10   DIFFUSE NONOBSTRUCTIVE CAD; NONISCHEMIC CARDIOMYOPATHY; LEFT VENTRICULAR ANGIOGRAM WAS PERFORMED SECONDARY TO  ELEVATED LEFT VENTRICULAR FILLING PRESSURES   COLONOSCOPY     ~ age 51-23   COLONOSCOPY W/ POLYPECTOMY     I & D EXTREMITY Bilateral 10/24/2019   Procedure: IRRIGATION AND DEBRIDEMENT BILATERAL EXTREMITY WOUND ON FOOT;  Surgeon: Allena Napoleon, MD;  Location: [facility] OR;  Service: Plastics;  Laterality: Bilateral;   ICD IMPLANT N/A 01/13/2022   Procedure: ICD IMPLANT;  Surgeon: Marinus Maw, MD;  Location: [facility] INVASIVE CV LAB;  Service: Cardiovascular;  Laterality: N/A;   METATARSAL HEAD EXCISION Right 08/29/2018   Procedure:  METATARSAL HEAD RESECTION;  Surgeon: Felecia Shelling, DPM;  Location: [facility] OR;  Service: Podiatry;  Laterality: Right;   METATARSAL OSTEOTOMY Right 08/29/2018   Procedure: SUB FIFTHE METATARSIA RIGHT FOOT;  Surgeon: Felecia Shelling, DPM;  Location: [facility];  Service: Podiatry;  Laterality: Right;   MULTIPLE EXTRACTIONS WITH ALVEOLOPLASTY  01/27/2014   "all my teeth; 4 Quadrants of alveoloplasty   MULTIPLE EXTRACTIONS WITH ALVEOLOPLASTY N/A 01/27/2014   Procedure: EXTRACTION OF TEETH #'1, 2, 3, 4, 5, 6, 7, 8, 9, 10, 11, 12, 13, 14, 15, 16, 17, 20, 21, 22, 23, 24, 25, 26, 27, 28, 29, 31 and 32 WITH ALVEOLOPLASTY;  Surgeon: Charlynne Pander, DDS;  Location: [facility];  Service: Oral Surgery;  Laterality: N/A;   ORIF FINGER / THUMB FRACTURE Right    POLYPECTOMY     RIGHT HEART CATH N/A 01/27/2021   Procedure: RIGHT HEART CATH;  Surgeon: Dolores Patty, MD;  Location: [facility] INVAS[facility] CV LAB;  Service: Cardiovascular;  Laterality: N/A;   RIGHT/LEFT HEART CATH AND CORONARY ANGIOGRAPHY N/A 09/26/2017   Procedure: RIGHT/LEFT HEART CATH AND CORONARY ANGIOGRAPHY;  Surgeon: Dolores Patty, MD;  [facility]: [facility];  Service: Cardiovascular;  Laterality: N/A;   RIGHT/LEFT HEART CATH AND CORONARY ANGIOGRAPHY N/A 04/12/2021   Procedure: RIGHT/LEFT HEART CATH AND CORONARY ANGIOGRAPHY;  Surgeon: Dolores Patty, MD;  Location: [facility];  Service: Cardiovascular;  Laterality: N/A;   SKIN SPLIT GRAFT Bilateral 10/24/2019   Procedure: SKIN GRAFT SPLIT THICKNESS LEFT THIGH;  Surgeon: Allena Napoleon, MD;  Location: [facility];  Service: Plastics;  Laterality:  Bilateral;   WOUND DEBRIDEMENT Right 08/29/2018   Procedure: Debridement of ulcer on right fifth metatarsal;  Surgeon: Felecia Shelling, DPM;  Location: [facility];  Service: Podiatry;  Laterality: Right;   Social History   Occupational History   Occupation: Mortician  Tobacco Use   Smoking status: Never   Smokeless tobacco: Never  Vaping Use   Vaping  Use: Never used  Substance and Sexual Activity   Alcohol use: No   Drug use: No   Sexual activity: Not on file

## 2022-11-14 ENCOUNTER — Encounter (HOSPITAL_COMMUNITY): Payer: Self-pay

## 2022-11-14 ENCOUNTER — Ambulatory Visit (HOSPITAL_COMMUNITY)
Admission: RE | Admit: 2022-11-14 | Discharge: 2022-11-14 | Disposition: A | Discharge status: Home / Self Care | Payer: No Typology Code available for payment source | Source: Ambulatory Visit | Attending: Family Medicine | Admitting: Family Medicine

## 2022-11-14 VITALS — BP 104/60 | HR 86 | Wt 291.6 lb

## 2022-11-14 DIAGNOSIS — I10 Essential (primary) hypertension: Secondary | ICD-10-CM | POA: Diagnosis not present

## 2022-11-14 DIAGNOSIS — Z7984 Long term (current) use of oral hypoglycemic drugs: Secondary | ICD-10-CM | POA: Insufficient documentation

## 2022-11-14 DIAGNOSIS — I739 Peripheral vascular disease, unspecified: Secondary | ICD-10-CM

## 2022-11-14 DIAGNOSIS — I110 Hypertensive heart disease with heart failure: Secondary | ICD-10-CM | POA: Diagnosis not present

## 2022-11-14 DIAGNOSIS — E785 Hyperlipidemia, unspecified: Secondary | ICD-10-CM | POA: Insufficient documentation

## 2022-11-14 DIAGNOSIS — Z89511 Acquired absence of right leg below knee: Secondary | ICD-10-CM | POA: Diagnosis not present

## 2022-11-14 DIAGNOSIS — I428 Other cardiomyopathies: Secondary | ICD-10-CM | POA: Insufficient documentation

## 2022-11-14 DIAGNOSIS — E1169 Type 2 diabetes mellitus with other specified complication: Secondary | ICD-10-CM

## 2022-11-14 DIAGNOSIS — Z8673 Personal history of transient ischemic attack (TIA), and cerebral infarction without residual deficits: Secondary | ICD-10-CM | POA: Diagnosis not present

## 2022-11-14 DIAGNOSIS — E1151 Type 2 diabetes mellitus with diabetic peripheral angiopathy without gangrene: Secondary | ICD-10-CM | POA: Insufficient documentation

## 2022-11-14 DIAGNOSIS — I5022 Chronic systolic (congestive) heart failure: Secondary | ICD-10-CM | POA: Insufficient documentation

## 2022-11-14 DIAGNOSIS — Z79899 Other long term (current) drug therapy: Secondary | ICD-10-CM | POA: Insufficient documentation

## 2022-11-14 DIAGNOSIS — Z794 Long term (current) use of insulin: Secondary | ICD-10-CM | POA: Diagnosis not present

## 2022-11-14 DIAGNOSIS — Z139 Encounter for screening, unspecified: Secondary | ICD-10-CM

## 2022-11-14 DIAGNOSIS — E782 Mixed hyperlipidemia: Secondary | ICD-10-CM | POA: Diagnosis not present

## 2022-11-14 DIAGNOSIS — I447 Left bundle-branch block, unspecified: Secondary | ICD-10-CM | POA: Diagnosis not present

## 2022-11-14 DIAGNOSIS — I2510 Atherosclerotic heart disease of native coronary artery without angina pectoris: Secondary | ICD-10-CM | POA: Diagnosis not present

## 2022-11-14 DIAGNOSIS — I272 Pulmonary hypertension, unspecified: Secondary | ICD-10-CM | POA: Diagnosis not present

## 2022-11-14 DIAGNOSIS — G4733 Obstructive sleep apnea (adult) (pediatric): Secondary | ICD-10-CM | POA: Insufficient documentation

## 2022-11-14 DIAGNOSIS — Z87440 Personal history of urinary (tract) infections: Secondary | ICD-10-CM

## 2022-11-14 DIAGNOSIS — Z95810 Presence of automatic (implantable) cardiac defibrillator: Secondary | ICD-10-CM | POA: Insufficient documentation

## 2022-11-14 LAB — BASIC METABOLIC PANEL
Anion gap: 9 (ref 5–15)
BUN: 47 mg/dL — ABNORMAL HIGH (ref 6–20)
CO2: 26 mmol/L (ref 22–32)
Calcium: 8.5 mg/dL — ABNORMAL LOW (ref 8.9–10.3)
Chloride: 103 mmol/L (ref 98–111)
Creatinine, Ser: 1.84 mg/dL — ABNORMAL HIGH (ref 0.61–1.24)
GFR, Estimated: 43 mL/min — ABNORMAL LOW (ref >60)
Glucose, Bld: 277 mg/dL — ABNORMAL HIGH (ref 70–99)
Potassium: 3.9 mmol/L (ref 3.5–5.1)
Sodium: 138 mmol/L (ref 135–145)

## 2022-11-14 LAB — CBC
HCT: 39 % (ref 39.0–52.0)
Hemoglobin: 12.7 g/dL — ABNORMAL LOW (ref 13.0–17.0)
MCH: 30 pg (ref 26.0–34.0)
MCHC: 32.6 g/dL (ref 30.0–36.0)
MCV: 92.2 fL (ref 80.0–100.0)
Platelets: 121 10*3/uL — ABNORMAL LOW (ref 150–400)
RBC: 4.23 MIL/uL (ref 4.22–5.81)
RDW: 12.2 % (ref 11.5–15.5)
WBC: 9.4 10*3/uL (ref 4.0–10.5)
nRBC: 0 % (ref 0.0–0.2)

## 2022-11-14 NOTE — Patient Instructions (Signed)
It was great to see you today! No medication changes are needed at this time.   Labs today We will only contact you if something comes back abnormal or we need to make some changes. Otherwise no news is good news!  Your physician wants you to follow-up in: 3 months with Dr Gala Romney and echo. You will receive a reminder letter in the mail two months in advance. If you don't receive a letter, please call our office to schedule the follow-up appointment.   Your physician has requested that you have an echocardiogram. Echocardiography is a painless test that uses sound waves to create images of your heart. It provides your doctor with information about the size and shape of your heart and how well your heart's chambers and valves are working. This procedure takes approximately one hour. There are no restrictions for this procedure. Please do NOT wear cologne, perfume, aftershave, or lotions (deodorant is allowed). Please arrive 15 minutes prior to your appointment time.  Do the following things EVERYDAY: Weigh yourself in the morning before breakfast. Write it down and keep it in a log. Take your medicines as prescribed Eat low salt foods--Limit salt (sodium) to 2000 mg per day.  Stay as active as you can everyday Limit all fluids for the day to less than 2 liters  At the [facility], you and your health needs are our priority. As part of our continuing mission to provide you with exceptional heart care, we have created designated Provider Care Teams. These Care Teams include your primary Cardiologist (physician) and Advanced Practice Providers (APPs- Physician Assistants and Nurse Practitioners) who all work together to provide you with the care you need, when you need it.   You may see any of the following providers on your designated Care Team at your next follow up: Dr Arvilla Meres Dr Marca Ancona Dr. Marcos Eke, NP Robbie Lis, [place] [facility] [place], [place] Brynda Peon, NP Karle Plumber, PharmD   Please be sure to bring in all your medications bottles to every appointment.    Thank you for choosing [facility]   If you have any questions or concerns before your next appointment please send [place] a message through [place] or call our office at [phone].    TO LEAVE A MESSAGE FOR THE NURSE SELECT OPTION 2, PLEASE LEAVE A MESSAGE INCLUDING: YOUR NAME DATE OF BIRTH CALL BACK NUMBER REASON FOR CALL**this is important as we prioritize the call backs  YOU WILL RECEIVE A CALL BACK THE SAME DAY AS LONG AS YOU CALL BEFORE 4:00 PM

## 2022-11-15 ENCOUNTER — Telehealth (HOSPITAL_COMMUNITY): Payer: Self-pay

## 2022-11-15 ENCOUNTER — Ambulatory Visit (HOSPITAL_COMMUNITY)
Admission: RE | Admit: 2022-11-15 | Discharge: 2022-11-15 | Disposition: A | Discharge status: Home / Self Care | Payer: No Typology Code available for payment source | Source: Ambulatory Visit | Attending: Orthopedic Surgery | Admitting: Orthopedic Surgery

## 2022-11-15 DIAGNOSIS — L97509 Non-pressure chronic ulcer of other part of unspecified foot with unspecified severity: Secondary | ICD-10-CM

## 2022-11-15 DIAGNOSIS — S91105A Unspecified open wound of left lesser toe(s) without damage to nail, initial encounter: Secondary | ICD-10-CM | POA: Diagnosis present

## 2022-11-15 DIAGNOSIS — I5022 Chronic systolic (congestive) heart failure: Secondary | ICD-10-CM

## 2022-11-15 LAB — VAS US ABI WITH/WO TBI: Left ABI: 1

## 2022-11-15 NOTE — Telephone Encounter (Signed)
Labs ordered and appointment scheduled. Pt aware, agreeable, and verbalized understanding

## 2022-11-15 NOTE — Telephone Encounter (Signed)
-----   Message from Jacklynn Ganong, [place] sent at 11/14/2022  3:57 PM EDT ----- Kidney function stable.   Repeat BMEt in 2 weeks to follow

## 2022-11-18 ENCOUNTER — Other Ambulatory Visit: Payer: Self-pay

## 2022-11-18 ENCOUNTER — Encounter (HOSPITAL_BASED_OUTPATIENT_CLINIC_OR_DEPARTMENT_OTHER): Payer: Self-pay | Admitting: Emergency Medicine

## 2022-11-18 ENCOUNTER — Emergency Department (HOSPITAL_BASED_OUTPATIENT_CLINIC_OR_DEPARTMENT_OTHER)
Admission: EM | Admit: 2022-11-18 | Discharge: 2022-11-18 | Disposition: A | Discharge status: Home / Self Care | Payer: No Typology Code available for payment source | Attending: Emergency Medicine | Admitting: Emergency Medicine

## 2022-11-18 DIAGNOSIS — Z8673 Personal history of transient ischemic attack (TIA), and cerebral infarction without residual deficits: Secondary | ICD-10-CM | POA: Diagnosis not present

## 2022-11-18 DIAGNOSIS — I110 Hypertensive heart disease with heart failure: Secondary | ICD-10-CM | POA: Diagnosis not present

## 2022-11-18 DIAGNOSIS — E11622 Type 2 diabetes mellitus with other skin ulcer: Secondary | ICD-10-CM | POA: Diagnosis not present

## 2022-11-18 DIAGNOSIS — I2510 Atherosclerotic heart disease of native coronary artery without angina pectoris: Secondary | ICD-10-CM | POA: Diagnosis not present

## 2022-11-18 DIAGNOSIS — I5022 Chronic systolic (congestive) heart failure: Secondary | ICD-10-CM | POA: Diagnosis not present

## 2022-11-18 DIAGNOSIS — L97829 Non-pressure chronic ulcer of other part of left lower leg with unspecified severity: Secondary | ICD-10-CM | POA: Insufficient documentation

## 2022-11-18 DIAGNOSIS — M79672 Pain in left foot: Secondary | ICD-10-CM | POA: Diagnosis present

## 2022-11-18 MED ORDER — DOXYCYCLINE HYCLATE 100 MG PO CAPS: 100.0000 mg | ORAL_CAPSULE | Freq: Two times a day (BID) | ORAL | 0 refills | Status: AC

## 2022-11-18 NOTE — ED Provider Notes (Signed)
Emergency Department Provider Note   I have reviewed the triage vital signs and the nursing notes.   HISTORY  Chief Complaint Skin Ulcer   HPI Mitchell [name] is a 52 y.o. male with past history reviewed below presents the emergency department with pain to the top of the left foot with some shallow ulcerations to the top of the toes on the left.  He is diabetic foot ulcers and is followed by Dr. Lajoyce Corners.  Next appointment is on May 9.  He has had increasing pain and is requesting antibiotics as often helps.  His daughter helps him clean and dress his feet daily.  Last dressing change was this morning.  No fevers or chills. No injury.   Past Medical History:  Diagnosis Date   Bipolar disorder    CAD (coronary artery disease)    a. diffuse 3v CAD by cath 2019, medical therapy recommended.   Cataract    forming    Chronic systolic CHF (congestive heart failure)    CVA (cerebral infarction)    No residual deficits   Depression    PTSD,    Diabetes mellitus    TYPE II; UNCONTROLLED BY HEMOGLOBIN A1c; STABLE AS  PER DISCHARGE   Headache(784.0)    Herpes simplex of male genitalia    History of colonic polyps    Hyperlipidemia    Hypertension    Myocardial infarction 1987   (while playing football)   Obesity    OSA (obstructive sleep apnea)    repeat study 2018 without significant OSA   Pneumonia    Post-cardiac injury syndrome    History of cardiac injury from blunt trauma   Pulmonary hypertension    a. moderately elevated PASP 07/2019.   PVCs (premature ventricular contractions)    Schizophrenia    Goes to [facility]   Sleep apnea    Stroke 2005   some left side weakness   Syncope    Recurrent, thought to be vasovagal. Also has h/o frequent PVCs.     Review of Systems  Constitutional: No fever/chills Cardiovascular: Denies chest pain. Respiratory: Denies shortness of breath. Gastrointestinal: No abdominal pain.  Musculoskeletal: Negative for  back pain. Positive left foot pain.  Skin: Positive rash to the toes.  Neurological: Negative for headaches.  ____________________________________________   PHYSICAL EXAM:  VITAL SIGNS: ED Triage Vitals  Enc Vitals Group     BP 11/18/22 1551 93/73     Pulse Rate 11/18/22 1551 94     Resp 11/18/22 1551 15     Temp 11/18/22 1551 98.5 F (36.9 C)     Temp Source 11/18/22 1551 Oral     SpO2 11/18/22 1551 99 %   Constitutional: Alert and oriented. Well appearing and in no acute distress. Eyes: Conjunctivae are normal. Head: Atraumatic. Nose: No congestion/rhinnorhea. Mouth/Throat: Mucous membranes are moist.   Neck: No stridor.   Cardiovascular: Good peripheral circulation. 2+ DP and PT pulses.  Respiratory: Normal respiratory effort.  Gastrointestinal: No distention.  Musculoskeletal: No lower extremity tenderness nor edema. No gross deformities of extremities. Neurologic:  Normal speech and language. No gross focal neurologic deficits are appreciated.  Skin:  Skin is warm and dry. Shallow ulcerations to the tops of the toes on the left as pictures. No deeper ulceration noted.      ____________________________________________   PROCEDURES  Procedure(s) performed:   Procedures  None  ____________________________________________   INITIAL IMPRESSION / ASSESSMENT AND PLAN / ED COURSE  Pertinent  labs & imaging results that were available during my care of the patient were reviewed by me and considered in my medical decision making (see chart for details).   This patient is Presenting for Evaluation of foot pain, which does require a range of treatment options, and is a complaint that involves a moderate risk of morbidity and mortality.  The Differential Diagnoses include foot ulceration, cellulitis, osteomyelitis, etc.  I decided to review pertinent External Data, and in summary patient with labs from earlier this week showing no leukocytosis or acute kidney injury.   ABI on the 17th within normal limits.  X-ray from 4/14 with no bony abnormality of the foot.     Medical Decision Making: Summary:  Patient presents emergency department shallow ulceration to the left foot.  Has had extensive workup recently and is requesting antibiotics.  Mild erythema is noted. Pulses intact. No evidence of limb ischemia. Will cover with doxycycline and have him keep his follow-up appointment with Dr. Lajoyce Corners to on May 9.  Reapplied the dressing prior to discharge.   Patient's presentation is most consistent with exacerbation of chronic illness.   Disposition: discharge  ____________________________________________  FINAL CLINICAL IMPRESSION(S) / ED DIAGNOSES  Final diagnoses:  Left foot pain     NEW OUTPATIENT MEDICATIONS STARTED DURING THIS VISIT:  New Prescriptions   DOXYCYCLINE (VIBRAMYCIN) 100 MG CAPSULE    Take 1 capsule (100 mg total) by mouth 2 (two) times daily for 7 days.    Note:  This document was prepared using Dragon voice recognition software and may include unintentional dictation errors.  Alona Bene, MD, [facility] Emergency [facility]    Mitchell [name], Arlyss Repress, MD 11/18/22 [phone]

## 2022-11-18 NOTE — Discharge Instructions (Signed)
I started you on antibiotics.  Please keep your follow-up appointment with your orthopedic doctor.  I would also like for you to discuss your recent ED visits with your primary care physician.  Return with any new or suddenly worsening symptoms.

## 2022-11-18 NOTE — ED Triage Notes (Signed)
Pt arrives to ED with c/o diabetic ulcer to toes on left foot. Pt notes he worried regarding infection.

## 2022-11-20 ENCOUNTER — Other Ambulatory Visit: Payer: Self-pay

## 2022-11-20 ENCOUNTER — Emergency Department (HOSPITAL_COMMUNITY): Payer: No Typology Code available for payment source

## 2022-11-20 ENCOUNTER — Encounter (HOSPITAL_COMMUNITY): Payer: Self-pay | Admitting: Emergency Medicine

## 2022-11-20 ENCOUNTER — Emergency Department (HOSPITAL_COMMUNITY)
Admission: EM | Admit: 2022-11-20 | Discharge: 2022-11-20 | Disposition: A | Discharge status: Home / Self Care | Payer: No Typology Code available for payment source | Attending: Emergency Medicine | Admitting: Emergency Medicine

## 2022-11-20 DIAGNOSIS — R0789 Other chest pain: Secondary | ICD-10-CM | POA: Diagnosis present

## 2022-11-20 LAB — CBC
HCT: 39.7 % (ref 39.0–52.0)
Hemoglobin: 13 g/dL (ref 13.0–17.0)
MCH: 29.8 pg (ref 26.0–34.0)
MCHC: 32.7 g/dL (ref 30.0–36.0)
MCV: 91.1 fL (ref 80.0–100.0)
Platelets: 120 10*3/uL — ABNORMAL LOW (ref 150–400)
RBC: 4.36 MIL/uL (ref 4.22–5.81)
RDW: 12.1 % (ref 11.5–15.5)
WBC: 10.3 10*3/uL (ref 4.0–10.5)
nRBC: 0 % (ref 0.0–0.2)

## 2022-11-20 LAB — BASIC METABOLIC PANEL
Anion gap: 13 (ref 5–15)
BUN: 43 mg/dL — ABNORMAL HIGH (ref 6–20)
CO2: 23 mmol/L (ref 22–32)
Calcium: 8.9 mg/dL (ref 8.9–10.3)
Chloride: 105 mmol/L (ref 98–111)
Creatinine, Ser: 2.08 mg/dL — ABNORMAL HIGH (ref 0.61–1.24)
GFR, Estimated: 37 mL/min — ABNORMAL LOW (ref >60)
Glucose, Bld: 152 mg/dL — ABNORMAL HIGH (ref 70–99)
Potassium: 4 mmol/L (ref 3.5–5.1)
Sodium: 141 mmol/L (ref 135–145)

## 2022-11-20 LAB — TROPONIN I (HIGH SENSITIVITY)
Troponin I (High Sensitivity): 17 ng/L (ref <18)
Troponin I (High Sensitivity): 18 ng/L — ABNORMAL HIGH (ref <18)

## 2022-11-20 NOTE — ED Triage Notes (Signed)
Patient from work where he started experiencing a bubbling sensation underneath the L side of his chest/ rib cage area, non radiating. 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 Endorsed shob while sensation was occurring earlier but no longer feeling that way. Denies n/v/d. VSS w/ EMS. Patient with [employer] defibrillator. Aox4

## 2022-11-20 NOTE — ED Provider Notes (Signed)
[facility] AT [facility] Provider Note   CSN: [id number] Arrival date & time: 11/20/22  1149     History  Chief Complaint  Patient presents with   Chest Pain    Mitchell [name] is a 52 y.o. male.  52 year old male presents with left-sided chest pain x 6 months.  Pain is positional and he has been seen by his doctor for this without an etiology.  No associated fever, cough, congestion.  No dyspnea.  No nausea vomiting or diaphoresis.  Denies any rashes to the area.  Today at work he moved a certain way and felt a sharp pain.  Better with remaining still.  No treatment use prior to       Home Medications Prior to Admission medications   Medication Sig Start Date End Date Taking? Authorizing Provider  Accu-Chek Softclix Lancets lancets Use to test blood sugars up to 4 times daily as needed. 12/08/21   Orland Mustard, MD  acetaminophen-codeine (TYLENOL #3) 300-30 MG tablet Take 1 tablet by mouth every 6 (six) hours as needed for moderate pain. 06/15/22   [name], Gwenlyn Found, MD  amitriptyline (ELAVIL) 10 MG tablet Take 1 tablet (10 mg total) by mouth at bedtime. 07/26/22   [name], Gwenlyn Found, MD  Ascorbic Acid (VITAMIN C PO) Take 1 tablet by mouth daily.    [provider]  aspirin 81 MG EC tablet Take 1 tablet (81 mg total) by mouth daily. 04/13/21   Danford, Earl Lites, MD  blood glucose meter kit and supplies Dispense based on patient and insurance preference. Use up to four times daily as directed. (FOR ICD-10 E10.9, E11.9). 06/15/22   [name], Gwenlyn Found, MD  Blood Glucose Monitoring Suppl (BLOOD GLUCOSE MONITOR SYSTEM) w/Device KIT Use to test blood sugar up to 4 times daily as needed. 12/08/21   Arby Barrette, MD  carvedilol (COREG) 6.25 MG tablet Take 1 tablet (6.25 mg total) by mouth 2 (two) times daily with a meal. 10/30/22   Bensimhon, [name], MD  Cholecalciferol (VITAMIN D-3 PO) Take 1 capsule by mouth daily.    [provider]  clopidogrel (PLAVIX) 75 MG tablet Take 1 tablet (75 mg total) by mouth daily. 07/26/22   Bensimhon, [name], MD  doxycycline (VIBRAMYCIN) 100 MG capsule Take 1 capsule (100 mg total) by mouth 2 (two) times daily for 7 days. 11/18/22 11/25/22  Long, Arlyss Repress, MD  gabapentin (NEURONTIN) 100 MG capsule Take 1 capsule (100 mg total) by mouth 2 (two) times daily. 07/26/22   [name], Gwenlyn Found, MD  glipiZIDE (GLUCOTROL XL) 5 MG 24 hr tablet Take 1 tablet (5 mg total) by mouth daily with breakfast. Take with first meal of the day 11/10/22   [name], Gwenlyn Found, MD  glucose blood (ACCU-CHEK GUIDE) test strip Use to test blood sugars up to 4 times daily as needed. 12/08/21   Orland Mustard, MD  Insulin Glargine [facility]) 100 UNIT/ML Inject 42 Units into the skin daily. 09/29/22   [name], Gwenlyn Found, MD  methocarbamol (ROBAXIN) 500 MG tablet Take 1 tablet (500 mg total) by mouth every 6 (six) hours as needed for muscle spasms. 07/26/22   [name], Gwenlyn Found, MD  pantoprazole (PROTONIX) 40 MG tablet Take 1 tablet (40 mg total) by mouth daily as needed (acid reflux). 02/13/22   [name], Gwenlyn Found, MD  potassium chloride SA (KLOR-CON M) 20 MEQ tablet Take 1 tablet (20 mEq total) by mouth daily. 05/05/22  Milford, Shanda Bumps M, FNP  rosuvastatin (CRESTOR) 40 MG tablet Take 1 tablet (40 mg total) by mouth daily. 07/26/22   Bensimhon, [name], MD  sacubitril-valsartan (ENTRESTO) 49-51 MG Take 1 tablet by mouth 2 (two) times daily. 10/28/22   Albertine Grates, MD  spironolactone (ALDACTONE) 25 MG tablet Take 0.5 tablets (12.5 mg total) by mouth daily. 10/27/22   Albertine Grates, MD  tamsulosin (FLOMAX) 0.4 MG CAPS capsule Take 1 capsule (0.4 mg total) by mouth daily after supper. 10/27/22   Albertine Grates, MD  torsemide (DEMADEX) 20 MG tablet Take 1 tablet (20 mg total) by mouth daily. 10/28/22   Albertine Grates, MD      Allergies    Marcelline Deist [dapagliflozin], Nsaids, and Trulicity [dulaglutide]    Review of Systems   Review of Systems  All  other systems reviewed and are negative.   Physical Exam Updated Vital Signs BP 118/77 (BP Location: Right Arm)   Pulse 88   Temp 98.3 F (36.8 C) (Oral)   Resp 18   Ht 1.829 m (6')   Wt 132 kg   SpO2 99%   BMI 39.47 kg/m  Physical Exam Vitals and nursing note reviewed.  Constitutional:      General: He is not in acute distress.    Appearance: Normal appearance. He is well-developed. He is not toxic-appearing.  HENT:     Head: Normocephalic and atraumatic.  Eyes:     General: Lids are normal.     Conjunctiva/sclera: Conjunctivae normal.     Pupils: Pupils are equal, round, and reactive to light.  Neck:     Thyroid: No thyroid mass.     Trachea: No tracheal deviation.  Cardiovascular:     Rate and Rhythm: Normal rate and regular rhythm.     Heart sounds: Normal heart sounds. No murmur heard.    No gallop.  Pulmonary:     Effort: Pulmonary effort is normal. No respiratory distress.     Breath sounds: Normal breath sounds. No stridor. No decreased breath sounds, wheezing, rhonchi or rales.  Chest:     Chest wall: Tenderness present. No crepitus.    Abdominal:     General: There is no distension.     Palpations: Abdomen is soft.     Tenderness: There is no abdominal tenderness. There is no rebound.  Musculoskeletal:        General: No tenderness. Normal range of motion.     Cervical back: Normal range of motion and neck supple.  Skin:    General: Skin is warm and dry.     Findings: No abrasion or rash.  Neurological:     Mental Status: He is alert and oriented to person, place, and time. Mental status is at baseline.     GCS: GCS eye subscore is 4. GCS verbal subscore is 5. GCS motor subscore is 6.     Cranial Nerves: No cranial nerve deficit.     Sensory: No sensory deficit.     Motor: Motor function is intact.  Psychiatric:        Attention and Perception: Attention normal.        Speech: Speech normal.        Behavior: Behavior normal.     ED Results /  Procedures / Treatments   Labs (all labs ordered are listed, but only abnormal results are displayed) Labs Reviewed  BASIC METABOLIC PANEL - Abnormal; Notable for the following components:      Result Value  Glucose, Bld 152 (*)    BUN 43 (*)    Creatinine, Ser 2.08 (*)    GFR, Estimated 37 (*)    All other components within normal limits  CBC - Abnormal; Notable for the following components:   Platelets 120 (*)    All other components within normal limits  TROPONIN I (HIGH SENSITIVITY)  TROPONIN I (HIGH SENSITIVITY)    EKG EKG Interpretation  Date/Time:  Monday November 20 2022 11:50:16 EDT Ventricular Rate:  85 PR Interval:  188 QRS Duration: 130 QT Interval:  440 QTC Calculation: 523 R Axis:   -54 Text Interpretation: Sinus rhythm with occasional Premature ventricular complexes Left axis deviation Non-specific intra-ventricular conduction block Minimal voltage criteria for LVH, may be normal variant ( Cornell product ) Cannot rule out Septal infarct , age undetermined Abnormal ECG When compared with ECG of 14-Nov-2022 10:39, PREVIOUS ECG IS PRESENT No significant change since last tracing Confirmed by Lorre Nick ([id number]) on 11/20/2022 3:10:00 PM  Radiology DG Chest 2 View  Result Date: 11/20/2022 CLINICAL DATA:  Shortness of breath.  Chest pain. EXAM: CHEST - 2 VIEW COMPARISON:  Chest radiographs 10/24/2022, 07/09/2022; CT chest 06/11/2016 FINDINGS: Left chest wall cardiac AICD with lead overlying the right ventricle, unchanged. Cardiac silhouette is again moderately enlarged mildly decreased lung volumes. The lungs are clear. No pleural effusion or pneumothorax. No acute skeletal abnormality. IMPRESSION: 1. No active cardiopulmonary disease. 2. Stable cardiomegaly and mildly decreased lung volumes. Electronically Signed   By: Neita Garnet M.D.   On: 11/20/2022 13:14    Procedures Procedures    Medications Ordered in ED Medications - No data to display  ED Course/  Medical Decision Making/ A&P                             Medical Decision Making Amount and/or Complexity of Data Reviewed Labs: ordered. Radiology: ordered.   Patient is EKG per interpretation shows no signs of acute ischemic changes noted.  Chest x-ray per interpretation shows no acute findings.  Troponin here is negative.  Patient's pain is reproducible.  Low suspicion for ACS.  Low suspicion for PE or dissection.  Suspect some type of chest wall issue.  Patient to follow-up with his doctor tomorrow        Final Clinical Impression(s) / ED Diagnoses Final diagnoses:  None    Rx / DC Orders ED Discharge Orders     None         Lorre Nick, MD 11/20/22 1530

## 2022-11-20 NOTE — ED Provider Triage Note (Signed)
Emergency Medicine Provider Triage Evaluation Note  Mitchell [name] , a 52 y.o. male  was evaluated in triage.  Pt complains of left-sided chest wall pain.  Patient reports symptoms began this morning around 1035.  Patient states that symptoms lasted for about 15 or 20 minutes, at the pain is worst it was a greater than 10 and currently it is a 4 out of 10.  Patient reports he has a history of the same pain in the same area, has been evaluated by PCP unable to find the source.  Patient reports this has been ongoing for the last 6 months.  Patient denies any overt chest pain, shortness of breath, nausea, vomiting, abdominal pain, back pain.  Patient denies lightheadedness, dizziness, weakness or syncope.  Patient reports he has not taken his medications today secondary to his daughter not packing his lunch, he usually takes his medications with food and was unable to do so.  Patient denies trauma to the left side of his chest wall.  Review of Systems  Positive: Left-sided chest wall tenderness. Negative:   Physical Exam  BP 101/80 (BP Location: Left Arm)   Pulse 88   Temp 98.7 F (37.1 C) (Oral)   Resp 16   Ht 6' (1.829 m)   Wt 132 kg   SpO2 98%   BMI 39.47 kg/m  Gen:   Awake, no distress   Resp:  Normal effort  MSK:   Moves extremities without difficulty  Other:    Medical Decision Making  Medically screening exam initiated at 12:15 PM.  Appropriate orders placed.  Mitchell [name] was informed that the remainder of the evaluation will be completed by another provider, this initial triage assessment does not replace that evaluation, and the importance of remaining in the ED until their evaluation is complete.     Al Decant, PA-C 11/20/22 1217

## 2022-11-21 ENCOUNTER — Emergency Department (HOSPITAL_BASED_OUTPATIENT_CLINIC_OR_DEPARTMENT_OTHER)
Admission: EM | Admit: 2022-11-21 | Discharge: 2022-11-22 | Disposition: A | Discharge status: Home / Self Care | Payer: No Typology Code available for payment source | Attending: Emergency Medicine | Admitting: Emergency Medicine

## 2022-11-21 ENCOUNTER — Encounter: Payer: Self-pay | Admitting: Urology

## 2022-11-21 ENCOUNTER — Emergency Department (HOSPITAL_BASED_OUTPATIENT_CLINIC_OR_DEPARTMENT_OTHER): Payer: No Typology Code available for payment source

## 2022-11-21 ENCOUNTER — Encounter (HOSPITAL_BASED_OUTPATIENT_CLINIC_OR_DEPARTMENT_OTHER): Payer: Self-pay | Admitting: Emergency Medicine

## 2022-11-21 ENCOUNTER — Ambulatory Visit (INDEPENDENT_AMBULATORY_CARE_PROVIDER_SITE_OTHER): Payer: No Typology Code available for payment source | Admitting: Urology

## 2022-11-21 ENCOUNTER — Other Ambulatory Visit: Payer: Self-pay

## 2022-11-21 VITALS — BP 95/65 | HR 84 | Ht 71.5 in | Wt 291.0 lb

## 2022-11-21 DIAGNOSIS — Z794 Long term (current) use of insulin: Secondary | ICD-10-CM | POA: Insufficient documentation

## 2022-11-21 DIAGNOSIS — I110 Hypertensive heart disease with heart failure: Secondary | ICD-10-CM | POA: Insufficient documentation

## 2022-11-21 DIAGNOSIS — Z79899 Other long term (current) drug therapy: Secondary | ICD-10-CM | POA: Insufficient documentation

## 2022-11-21 DIAGNOSIS — Z7984 Long term (current) use of oral hypoglycemic drugs: Secondary | ICD-10-CM | POA: Insufficient documentation

## 2022-11-21 DIAGNOSIS — I5022 Chronic systolic (congestive) heart failure: Secondary | ICD-10-CM | POA: Diagnosis not present

## 2022-11-21 DIAGNOSIS — N529 Male erectile dysfunction, unspecified: Secondary | ICD-10-CM

## 2022-11-21 DIAGNOSIS — I2510 Atherosclerotic heart disease of native coronary artery without angina pectoris: Secondary | ICD-10-CM | POA: Insufficient documentation

## 2022-11-21 DIAGNOSIS — R0789 Other chest pain: Secondary | ICD-10-CM | POA: Insufficient documentation

## 2022-11-21 DIAGNOSIS — N401 Enlarged prostate with lower urinary tract symptoms: Secondary | ICD-10-CM

## 2022-11-21 DIAGNOSIS — N138 Other obstructive and reflux uropathy: Secondary | ICD-10-CM

## 2022-11-21 DIAGNOSIS — Z7902 Long term (current) use of antithrombotics/antiplatelets: Secondary | ICD-10-CM | POA: Insufficient documentation

## 2022-11-21 DIAGNOSIS — R109 Unspecified abdominal pain: Secondary | ICD-10-CM

## 2022-11-21 DIAGNOSIS — Z7982 Long term (current) use of aspirin: Secondary | ICD-10-CM | POA: Diagnosis not present

## 2022-11-21 DIAGNOSIS — M546 Pain in thoracic spine: Secondary | ICD-10-CM | POA: Insufficient documentation

## 2022-11-21 DIAGNOSIS — E119 Type 2 diabetes mellitus without complications: Secondary | ICD-10-CM | POA: Diagnosis not present

## 2022-11-21 LAB — TROPONIN I (HIGH SENSITIVITY): Troponin I (High Sensitivity): 26 ng/L — ABNORMAL HIGH (ref <18)

## 2022-11-21 LAB — CBC WITH DIFFERENTIAL/PLATELET
Abs Immature Granulocytes: 0.03 10*3/uL (ref 0.00–0.07)
Basophils Absolute: 0 10*3/uL (ref 0.0–0.1)
Basophils Relative: 0 %
Eosinophils Absolute: 0.1 10*3/uL (ref 0.0–0.5)
Eosinophils Relative: 1 %
HCT: 38.7 % — ABNORMAL LOW (ref 39.0–52.0)
Hemoglobin: 12.6 g/dL — ABNORMAL LOW (ref 13.0–17.0)
Immature Granulocytes: 0 %
Lymphocytes Relative: 14 %
Lymphs Abs: 1.5 10*3/uL (ref 0.7–4.0)
MCH: 29.9 pg (ref 26.0–34.0)
MCHC: 32.6 g/dL (ref 30.0–36.0)
MCV: 91.9 fL (ref 80.0–100.0)
Monocytes Absolute: 0.6 10*3/uL (ref 0.1–1.0)
Monocytes Relative: 6 %
Neutro Abs: 8.5 10*3/uL — ABNORMAL HIGH (ref 1.7–7.7)
Neutrophils Relative %: 79 %
Platelets: 103 10*3/uL — ABNORMAL LOW (ref 150–400)
RBC: 4.21 MIL/uL — ABNORMAL LOW (ref 4.22–5.81)
RDW: 12.4 % (ref 11.5–15.5)
WBC: 10.8 10*3/uL — ABNORMAL HIGH (ref 4.0–10.5)
nRBC: 0 % (ref 0.0–0.2)

## 2022-11-21 LAB — URINALYSIS, ROUTINE W REFLEX MICROSCOPIC
Bilirubin, UA: NEGATIVE
Glucose, UA: NEGATIVE
Ketones, UA: NEGATIVE
Leukocytes,UA: NEGATIVE
Nitrite, UA: NEGATIVE
Protein,UA: NEGATIVE
RBC, UA: NEGATIVE
Specific Gravity, UA: 1.015 (ref 1.005–1.030)
Urobilinogen, Ur: 0.2 mg/dL (ref 0.2–1.0)
pH, UA: 5.5 (ref 5.0–7.5)

## 2022-11-21 LAB — BASIC METABOLIC PANEL
Anion gap: 11 (ref 5–15)
BUN: 54 mg/dL — ABNORMAL HIGH (ref 6–20)
CO2: 23 mmol/L (ref 22–32)
Calcium: 9 mg/dL (ref 8.9–10.3)
Chloride: 108 mmol/L (ref 98–111)
Creatinine, Ser: 2.1 mg/dL — ABNORMAL HIGH (ref 0.61–1.24)
GFR, Estimated: 37 mL/min — ABNORMAL LOW (ref >60)
Glucose, Bld: 158 mg/dL — ABNORMAL HIGH (ref 70–99)
Potassium: 4.6 mmol/L (ref 3.5–5.1)
Sodium: 142 mmol/L (ref 135–145)

## 2022-11-21 LAB — BLADDER SCAN AMB NON-IMAGING

## 2022-11-21 MED ORDER — TADALAFIL 20 MG PO TABS: 20.0000 mg | ORAL_TABLET | Freq: Every day | ORAL | 11 refills | Status: DC | PRN

## 2022-11-21 MED ORDER — ACETAMINOPHEN 325 MG PO TABS
650.0000 mg | ORAL_TABLET | Freq: Once | ORAL | Status: AC
  Administered 2022-11-21: 650 mg via ORAL
  Filled 2022-11-21: qty 2

## 2022-11-21 MED ORDER — FENTANYL CITRATE PF 50 MCG/ML IJ SOSY: 50.0000 ug | PREFILLED_SYRINGE | Freq: Once | INTRAMUSCULAR | Status: DC

## 2022-11-21 MED ORDER — LIDOCAINE 5 % EX PTCH
2.0000 | MEDICATED_PATCH | CUTANEOUS | Status: DC
  Administered 2022-11-22: 2 via TRANSDERMAL
  Filled 2022-11-21: qty 2

## 2022-11-21 NOTE — ED Notes (Signed)
Urinal at bedside.  

## 2022-11-21 NOTE — ED Notes (Signed)
Patient given apple juice per his request
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
## 2022-11-21 NOTE — Discharge Instructions (Signed)
Your CT imaging was negative for fracture in your back or fracture of your ribs or sternum.  The remainder of your trauma workup was reassuring.  You had a mildly elevated troponin which was not significantly climbing.

## 2022-11-21 NOTE — ED Notes (Signed)
Patient transported to CT 
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
## 2022-11-21 NOTE — Progress Notes (Signed)
Assessment: 1. BPH with obstruction/lower urinary tract symptoms   2. Organic impotence   3. Flank pain     Plan: I personally reviewed the patient's chart including provider notes, lab and imaging results. I reviewed the CT study from 10/23/2022 with results as noted below. No urologic etiology for his flank pain. His urinalysis from 10/21/2022 appears to be consistent with a UTI although a urine culture was not performed. His urinalysis is negative today. Continue tamsulosin 0.4 mg daily Trial of tadalafil 20 mg as needed.  Prescription provided. Return to the office in 6 weeks.  Chief Complaint:  Chief Complaint  Patient presents with   bladder    neurogenic    History of Present Illness:  Mitchell [name] is a 52 y.o. male who is seen in consultation from [name], Gwenlyn Found, MD for evaluation of lower urinary tract symptoms, left flank pain, and erectile dysfunction. He has a history of lower urinary tract symptoms including frequency and nocturia for years.  He associates this with his use of torsemide.  No dysuria.  He reports an episode of gross hematuria prior to his hospitalization in March 2024.  Urinalysis at that time showed >50 WBCs, 11-20 RBCs, and rare bacteria.  No culture was obtained.  No further episodes of gross hematuria.  He is currently on tamsulosin. IPSS = 9 today.  He reports intermittent left flank pain for 6 months.  The last episode occurred yesterday.  His symptoms appear to be positional and associated with certain movements. CT abdomen and pelvis without contrast from 10/23/2022 showed no renal or ureteral calculi, no renal mass or evidence of obstruction.  He has a history of erectile dysfunction.  He is unable to achieve an adequate erection for intercourse.  He has previously tried sildenafil without improvement.  He is also tried a vacuum erection device with unsatisfactory results.  He has seen Dr. Benancio Deeds at [facility] urology in 2021 and was  prescribed prostaglandin for intracavernosal injection.  He reports minimal results with the dose scribed and did not follow-up after his visit in November 2021.  PSA 3/24:  1.4 with 24% free   Past Medical History:  Past Medical History:  Diagnosis Date   Bipolar disorder    CAD (coronary artery disease)    a. diffuse 3v CAD by cath 2019, medical therapy recommended.   Cataract    forming    Chronic systolic CHF (congestive heart failure)    CVA (cerebral infarction)    No residual deficits   Depression    PTSD,    Diabetes mellitus    TYPE II; UNCONTROLLED BY HEMOGLOBIN A1c; STABLE AS  PER DISCHARGE   Headache(784.0)    Herpes simplex of male genitalia    History of colonic polyps    Hyperlipidemia    Hypertension    Myocardial infarction 1987   (while playing football)   Obesity    OSA (obstructive sleep apnea)    repeat study 2018 without significant OSA   Pneumonia    Post-cardiac injury syndrome    History of cardiac injury from blunt trauma   Pulmonary hypertension    a. moderately elevated PASP 07/2019.   PVCs (premature ventricular contractions)    Schizophrenia    Goes to [facility]   Sleep apnea    Stroke 2005   some left side weakness   Syncope    Recurrent, thought to be vasovagal. Also has h/o frequent PVCs.     Past  Surgical History:  Past Surgical History:  Procedure Laterality Date   AMPUTATION Right 01/25/2021   Procedure: RIGHT BELOW KNEE AMPUTATION;  Surgeon: Nadara Mustard, MD;  [facility] OR;  Service: Orthopedics;  Laterality: Right;   CARDIAC CATHETERIZATION  12/19/10   DIFFUSE NONOBSTRUCTIVE CAD; NONISCHEMIC CARDIOMYOPATHY; LEFT VENTRICULAR ANGIOGRAM WAS PERFORMED SECONDARY TO  ELEVATED LEFT VENTRICULAR FILLING PRESSURES   COLONOSCOPY     ~ age [age]   COLONOSCOPY W/ POLYPECTOMY     I & D EXTREMITY Bilateral 10/24/2019   Procedure: IRRIGATION AND DEBRIDEMENT BILATERAL EXTREMITY WOUND ON FOOT;  Surgeon: Allena Napoleon,  MD;  Location: [facility] OR;  Service: Plastics;  Laterality: Bilateral;   ICD IMPLANT N/A 01/13/2022   Procedure: ICD IMPLANT;  Surgeon: Marinus Maw, MD;  Location: [facility] INVASIVE CV LAB;  Service: Cardiovascular;  Laterality: N/A;   METATARSAL HEAD EXCISION Right 08/29/2018   Procedure: METATARSAL HEAD RESECTION;  Surgeon: Felecia Shelling, DPM;  Location: [facility] OR;  Service: Podiatry;  Laterality: Right;   METATARSAL OSTEOTOMY Right 08/29/2018   Procedure: SUB FIFTHE METATARSIA RIGHT FOOT;  Surgeon: Felecia Shelling, DPM;  Location: [facility] OR;  Service: Podiatry;  Laterality: Right;   MULTIPLE EXTRACTIONS WITH ALVEOLOPLASTY  01/27/2014   "all my teeth; 4 Quadrants of alveoloplasty   MULTIPLE EXTRACTIONS WITH ALVEOLOPLASTY N/A 01/27/2014   Procedure: EXTRACTION OF TEETH #'1, 2, 3, 4, 5, 6, 7, 8, 9, 10, 11, 12, 13, 14, 15, 16, 17, 20, 21, 22, 23, 24, 25, 26, 27, 28, 29, 31 and 32 WITH ALVEOLOPLASTY;  Surgeon: Charlynne Pander, DDS;  Location: [facility] OR;  Service: Oral Surgery;  Laterality: N/A;   ORIF FINGER / THUMB FRACTURE Right    POLYPECTOMY     RIGHT HEART CATH N/A 01/27/2021   Procedure: RIGHT HEART CATH;  Surgeon: Dolores Patty, MD;  Location: [facility] INVASIVE CV LAB;  Service: Cardiovascular;  Laterality: N/A;   RIGHT/LEFT HEART CATH AND CORONARY ANGIOGRAPHY N/A 09/26/2017   Procedure: RIGHT/LEFT HEART CATH AND CORONARY ANGIOGRAPHY;  Surgeon: Dolores Patty, MD;  Location: [facility] INVASIVE CV LAB;  Service: Cardiovascular;  Laterality: N/A;   RIGHT/LEFT HEART CATH AND CORONARY ANGIOGRAPHY N/A 04/12/2021   Procedure: RIGHT/LEFT HEART CATH AND CORONARY ANGIOGRAPHY;  Surgeon: Dolores Patty, MD;  Location: [facility] INVASIVE CV LAB;  Service: Cardiovascular;  Laterality: N/A;   SKIN SPLIT GRAFT Bilateral 10/24/2019   Procedure: SKIN GRAFT SPLIT THICKNESS LEFT THIGH;  Surgeon: Allena Napoleon, MD;  Location: [facility] OR;  Service: Plastics;  Laterality: Bilateral;   WOUND DEBRIDEMENT Right 08/29/2018   Procedure: Debridement  of ulcer on right fifth metatarsal;  Surgeon: Felecia Shelling, DPM;  Location: [facility] OR;  Service: Podiatry;  Laterality: Right;    Allergies:  Allergies  Allergen Reactions   Farxiga [Dapagliflozin] Other (See Comments)    Patient reports loss of consciousness   Nsaids Anaphylaxis and Rash    Able to tolerate aspirin    Trulicity [Dulaglutide] Nausea And Vomiting    Family History:  Family History  Problem Relation Age of Onset   Heart disease Mother        MI   Heart failure Mother    Diabetes Mother        ALSO IN MOST OF HIS SIBLINGS; 2 UNLCES HAVE ALSO PASSED AWAY FROM DM   Cardiomyopathy Mother    Cancer - Ovarian Mother    Ovarian cancer Mother    Heart disease Father    Hypertension Father  Diabetes Father    Diabetes Sister    Diabetes Brother    Colon cancer Paternal Uncle    Colon cancer Paternal Uncle    Colon polyps Neg Hx    Esophageal cancer Neg Hx    Rectal cancer Neg Hx    Stomach cancer Neg Hx     Social History:  Social History   Tobacco Use   Smoking status: Never   Smokeless tobacco: Never  Vaping Use   Vaping Use: Never used  Substance Use Topics   Alcohol use: No   Drug use: No    Review of symptoms:  Constitutional:  Negative for unexplained weight loss, night sweats, fever, chills ENT:  Negative for nose bleeds, sinus pain, painful swallowing CV:  Negative for chest pain, shortness of breath, exercise intolerance, palpitations, loss of consciousness Resp:  Negative for cough, wheezing, shortness of breath GI:  Negative for nausea, vomiting, diarrhea, bloody stools GU:  Positives noted in HPI; otherwise negative for gross hematuria, dysuria, urinary incontinence Neuro:  Negative for seizures, poor balance, limb weakness, slurred speech Psych:  Negative for lack of energy, depression, anxiety Endocrine:  Negative for polydipsia, polyuria, symptoms of hypoglycemia (dizziness, hunger, sweating) Hematologic:  Negative for anemia, purpura,  petechia, prolonged or excessive bleeding, use of anticoagulants  Allergic:  Negative for difficulty breathing or choking as a result of exposure to anything; no shellfish allergy; no allergic response (rash/itch) to materials, foods  Physical exam: BP 95/65   Pulse 84   Ht 5' 11.5" (1.816 m)   Wt 291 lb (132 kg)   BMI 40.02 kg/m  GENERAL APPEARANCE:  Well appearing, well developed, well nourished, NAD HEENT: Atraumatic, Normocephalic, oropharynx clear. NECK: Supple without lymphadenopathy or thyromegaly. LUNGS: Clear to auscultation bilaterally. HEART: Regular Rate and Rhythm without murmurs, gallops, or rubs. ABDOMEN: Soft, non-tender, No Masses. EXTREMITIES: Moves all extremities well.  Without clubbing, cyanosis, or edema. NEUROLOGIC:  Alert and oriented x 3, normal gait, CN II-XII grossly intact.  MENTAL STATUS:  Appropriate. BACK:  Non-tender to palpation.  No CVAT SKIN:  Warm, dry and intact.   GU: Penis:  uncircumcised Meatus: Normal Scrotum: normal, no masses Testis: normal without masses bilateral Epididymis: normal Prostate: 40 g, NT, no nodules Rectum: Normal tone,  no masses or tenderness   Results: U/A:  negative  PVR:  86 ml

## 2022-11-22 ENCOUNTER — Telehealth: Payer: Self-pay | Admitting: Orthopedic Surgery

## 2022-11-22 NOTE — Telephone Encounter (Signed)
Patient advising that he need a technician to call him about his treatment due to his employment

## 2022-11-22 NOTE — Telephone Encounter (Signed)
Tammy, I SW pt, he says his HR dept is faxing over a "right to work" form to [place]. When/If you see this please bring it to me and I will fill out. FYI  Thanks!!

## 2022-11-23 ENCOUNTER — Telehealth: Payer: Self-pay | Admitting: Family Medicine

## 2022-11-23 NOTE — Telephone Encounter (Signed)
Received
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
## 2022-11-24 ENCOUNTER — Ambulatory Visit (INDEPENDENT_AMBULATORY_CARE_PROVIDER_SITE_OTHER): Payer: No Typology Code available for payment source | Admitting: Podiatry

## 2022-11-24 DIAGNOSIS — Z91199 Patient's noncompliance with other medical treatment and regimen due to unspecified reason: Secondary | ICD-10-CM

## 2022-11-24 NOTE — Telephone Encounter (Signed)
In folder for completion.

## 2022-11-24 NOTE — Progress Notes (Signed)
Remote ICD transmission.   

## 2022-11-24 NOTE — Telephone Encounter (Signed)
Paperwork from pt's HR was put in pcp's s.drive.

## 2022-11-24 NOTE — Progress Notes (Signed)
No show

## 2022-11-27 NOTE — Telephone Encounter (Signed)
Form faxed to Attn: Britta Mccreedy at [phone]

## 2022-11-27 NOTE — Telephone Encounter (Signed)
Britta Mccreedy, HR director, called to follow up on form to allow pt to come back to work. Advised that provider has not been in the office since form was put in her box so she has not been able to complete. Please complete form and fax to Attn: Britta Mccreedy at [phone]. IF any questions/concerns, please call her at [phone].
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
## 2022-11-29 ENCOUNTER — Other Ambulatory Visit (HOSPITAL_COMMUNITY): Payer: No Typology Code available for payment source

## 2022-11-30 ENCOUNTER — Ambulatory Visit (INDEPENDENT_AMBULATORY_CARE_PROVIDER_SITE_OTHER): Payer: No Typology Code available for payment source | Admitting: Family Medicine

## 2022-11-30 DIAGNOSIS — E1169 Type 2 diabetes mellitus with other specified complication: Secondary | ICD-10-CM

## 2022-11-30 DIAGNOSIS — Z89511 Acquired absence of right leg below knee: Secondary | ICD-10-CM | POA: Diagnosis not present

## 2022-11-30 DIAGNOSIS — Z794 Long term (current) use of insulin: Secondary | ICD-10-CM | POA: Diagnosis not present

## 2022-11-30 NOTE — Patient Instructions (Signed)
Good to see you today- I will see you next month unless you need anything in the meantime

## 2022-12-06 ENCOUNTER — Ambulatory Visit (INDEPENDENT_AMBULATORY_CARE_PROVIDER_SITE_OTHER): Payer: No Typology Code available for payment source | Admitting: Family

## 2022-12-06 ENCOUNTER — Encounter: Payer: Self-pay | Admitting: Family

## 2022-12-06 DIAGNOSIS — L97521 Non-pressure chronic ulcer of other part of left foot limited to breakdown of skin: Secondary | ICD-10-CM | POA: Diagnosis not present

## 2022-12-06 DIAGNOSIS — Z89511 Acquired absence of right leg below knee: Secondary | ICD-10-CM

## 2022-12-06 NOTE — Progress Notes (Signed)
Office Visit Note   Patient: Mitchell [name]           Date of Birth: 09-11-1970           MRN: [id number] Visit Date: 12/06/2022              Requested by: Pearline Cables, MD [address] [place],  [place] [address] PCP: Pearline Cables, MD  Chief Complaint  Patient presents with   Left Foot - Wound Check      HPI: The patient is a 52 year old gentleman seen today in routine follow-up for ulceration over the PIP joint of the left toe.  Callused ulceration to the distal tip as well.  He has had recent ABIs on April 17.  This was normal. . He states that works they have asked him to start using a machine that uses 3 pedals he has below-knee amputee on the right and with his wounds on the left he does not feel comfortable using a machine that uses topicals.  He also reports that he is primarily wheelchair-bound he does use a cane for short distance walking with transfers.  Assessment & Plan: Visit Diagnoses: No diagnosis found.  Plan: Callus parred today x 1 patient tolerated well.  Given a note to return to work tomorrow given a note that he will not be using his feet for work no prolonged walking no standing no using a pedal  Follow-Up Instructions: Return in about 4 weeks (around 01/03/2023), or if symptoms worsen or fail to improve.   Ortho Exam  Patient is alert, oriented, no adenopathy, well-dressed, normal affect, normal respiratory effort. On examination of the left foot there is no edema no erythema he does have a dorsal ulcer over the PIP joint of the second toe this is 3 mm in diameter draining filled in with fibrinous tissue.  There is no depth.  There is callused ulceration to the distal tip which parred with a 10 blade knife back to viable tissue there is no open area or drainage  Imaging: No results found. No images are attached to the encounter.  His resting left ankle-brachial index was within normal range.  Labs: Lab Results  Component  Value Date   HGBA1C 10.2 (H) 10/25/2022   HGBA1C 11.8 (H) 06/15/2022   HGBA1C 7.8 (A) 08/19/2021   ESRSEDRATE 20 (H) 03/08/2022   ESRSEDRATE 81 (H) 10/21/2019   ESRSEDRATE 25 (H) 06/17/2018   CRP 10.3 (H) 01/28/2021   CRP 12.8 (H) 01/27/2021   CRP 21.1 (H) 01/26/2021   LABURIC 10.9 (H) 10/23/2022   REPTSTATUS 10/28/2022 FINAL 10/23/2022   GRAMSTAIN  08/29/2018    RARE WBC PRESENT, PREDOMINANTLY PMN NO ORGANISMS SEEN    CULT  10/23/2022    NO GROWTH 5 DAYS Performed at [facility], [address] [place], [place] [address]    LABORGA KLEBSIELLA PNEUMONIAE 05/21/2018   LABORGA ENTEROBACTER SPECIES 05/21/2018   LABORGA STAPHYLOCOCCUS AUREUS 05/21/2018     Lab Results  Component Value Date   ALBUMIN 4.4 11/08/2022   ALBUMIN 3.6 10/23/2022   ALBUMIN 4.2 10/21/2022   PREALBUMIN 6.8 (L) 10/21/2019    Lab Results  Component Value Date   MG 2.3 11/29/2021   MG 1.5 (L) 04/12/2021   MG 1.8 02/16/2021   Lab Results  Component Value Date   VD25OH 11 (L) 08/16/2009    Lab Results  Component Value Date   PREALBUMIN 6.8 (L) 10/21/2019  Latest Ref Rng & Units 11/21/2022    8:44 PM 11/20/2022   12:26 PM 11/14/2022   11:22 AM  CBC EXTENDED  WBC 4.0 - 10.5 K/uL 10.8  10.3  9.4   RBC 4.22 - 5.81 MIL/uL 4.21  4.36  4.23   Hemoglobin 13.0 - 17.0 g/dL 40.9  81.1  91.4   HCT 39.0 - 52.0 % 38.7  39.7  39.0   Platelets 150 - 400 K/uL 103  120  121   NEUT# 1.7 - 7.7 K/uL 8.5     Lymph# 0.7 - 4.0 K/uL 1.5        There is no height or weight on file to calculate BMI.  Orders:  No orders of the defined types were placed in this encounter.  No orders of the defined types were placed in this encounter.    Procedures: No procedures performed  Clinical Data: No additional findings.  ROS:  All other systems negative, except as noted in the HPI. Review of Systems  Objective: Vital Signs: There were no vitals taken for this visit.  Specialty Comments:  No  specialty comments available.  PMFS History: Patient Active Problem List   Diagnosis Date Noted   BPH with obstruction/lower urinary tract symptoms 11/21/2022   UTI (urinary tract infection) 10/23/2022   ICD (implantable cardioverter-defibrillator) in place 04/19/2022   Near syncope 12/08/2021   CKD (chronic kidney disease) stage 3, GFR 30-59 ml/min (HCC) 12/08/2021   PVD s/p R LE amputation  12/08/2021   Diabetic retinopathy associated with type 2 diabetes mellitus (HCC) 06/06/2021   CAD (coronary artery disease) with flat troponin  04/11/2021   S/P below knee amputation, right (HCC) 02/07/2021   AKI (acute kidney injury) (HCC) 01/23/2021   QT prolongation 07/30/2019   Acute on chronic combined systolic and diastolic CHF (congestive heart failure) (HCC) 07/29/2019   Stroke (HCC) 05/08/2017   Uncontrolled type 2 diabetes mellitus with hyperglycemia, with long-term current use of insulin (HCC) 12/07/2015   Chronic systolic heart failure (HCC) 10/02/2011   Organic impotence 10/02/2011   Obstructive sleep apnea 10/11/2007   HLD (hyperlipidemia) 10/10/2007   HYPOKALEMIA 10/10/2007   Obesity, Class III, BMI 40-49.9 (morbid obesity) (HCC) 10/10/2007   Essential hypertension 10/10/2007   PREMATURE VENTRICULAR CONTRACTIONS 10/10/2007   COLONIC POLYPS, HX OF 10/10/2007   Past Medical History:  Diagnosis Date   Bipolar disorder (HCC)    CAD (coronary artery disease)    a. diffuse 3v CAD by cath 2019, medical therapy recommended.   Cataract    forming    Chronic systolic CHF (congestive heart failure) (HCC)    CVA (cerebral infarction)    No residual deficits   Depression    PTSD,    Diabetes mellitus    TYPE II; UNCONTROLLED BY HEMOGLOBIN A1c; STABLE AS  PER DISCHARGE   Headache(784.0)    Herpes simplex of male genitalia    History of colonic polyps    Hyperlipidemia    Hypertension    Myocardial infarction (HCC) 1987   (while playing football)   Obesity    OSA (obstructive  sleep apnea)    repeat study 2018 without significant OSA   Pneumonia    Post-cardiac injury syndrome (HCC)    History of cardiac injury from blunt trauma   Pulmonary hypertension (HCC)    a. moderately elevated PASP 07/2019.   PVCs (premature ventricular contractions)    Schizophrenia (HCC)    Goes to [facility]   Sleep apnea  Stroke [facility]) 2005   some left side weakness   Syncope    Recurrent, thought to be vasovagal. Also has h/o frequent PVCs.     Family History  Problem Relation Age of Onset   Heart disease Mother        MI   Heart failure Mother    Diabetes Mother        ALSO IN MOST OF HIS SIBLINGS; 2 UNLCES HAVE ALSO PASSED AWAY FROM DM   Cardiomyopathy Mother    Cancer - Ovarian Mother    Ovarian cancer Mother    Heart disease Father    Hypertension Father    Diabetes Father    Diabetes Sister    Diabetes Brother    Colon cancer Paternal Uncle    Colon cancer Paternal Uncle    Colon polyps Neg Hx    Esophageal cancer Neg Hx    Rectal cancer Neg Hx    Stomach cancer Neg Hx     Past Surgical History:  Procedure Laterality Date   AMPUTATION Right 01/25/2021   Procedure: RIGHT BELOW KNEE AMPUTATION;  Surgeon: Nadara Mustard, MD;  Location: [facility] OR;  Service: Orthopedics;  Laterality: Right;   CARDIAC CATHETERIZATION  12/19/10   DIFFUSE NONOBSTRUCTIVE CAD; NONISCHEMIC CARDIOMYOPATHY; LEFT VENTRICULAR ANGIOGRAM WAS PERFORMED SECONDARY TO  ELEVATED LEFT VENTRICULAR FILLING PRESSURES   COLONOSCOPY     ~ age 32-23   COLONOSCOPY W/ POLYPECTOMY     I & D EXTREMITY Bilateral 10/24/2019   Procedure: IRRIGATION AND DEBRIDEMENT BILATERAL EXTREMITY WOUND ON FOOT;  Surgeon: Allena Napoleon, MD;  Location: [facility] OR;  Service: Plastics;  Laterality: Bilateral;   ICD IMPLANT N/A 01/13/2022   Procedure: ICD IMPLANT;  Surgeon: Marinus Maw, MD;  Location: [facility] INVASIVE CV LAB;  Service: Cardiovascular;  Laterality: N/A;   METATARSAL HEAD EXCISION Right 08/29/2018    Procedure: METATARSAL HEAD RESECTION;  Surgeon: Felecia Shelling, DPM;  Location: [facility];  Service: Podiatry;  Laterality: Right;   METATARSAL OSTEOTOMY Right 08/29/2018   Procedure: SUB FIFTHE METATARSIA RIGHT FOOT;  Surgeon: Felecia Shelling, DPM;  Location: [facility];  Service: Podiatry;  Laterality: Right;   MULTIPLE EXTRACTIONS WITH ALVEOLOPLASTY  01/27/2014   "all my teeth; 4 Quadrants of alveoloplasty   MULTIPLE EXTRACTIONS WITH ALVEOLOPLASTY N/A 01/27/2014   Procedure: EXTRACTION OF TEETH #'1, 2, 3, 4, 5, 6, 7, 8, 9, 10, 11, 12, 13, 14, 15, 16, 17, 20, 21, 22, 23, 24, 25, 26, 27, 28, 29, 31 and 32 WITH ALVEOLOPLASTY;  Surgeon: Charlynne Pander, DDS;  Location: [facility];  Service: Oral Surgery;  Laterality: N/A;   ORIF FINGER / THUMB FRACTURE Right    POLYPECTOMY     RIGHT HEART CATH N/A 01/27/2021   Procedure: RIGHT HEART CATH;  Surgeon: Dolores Patty, MD;  Location: [facility] INVASIVE CV LAB;  Service: Cardiovascular;  Laterality: N/A;   RIGHT/LEFT HEART CATH AND CORONARY ANGIOGRAPHY N/A 09/26/2017   Procedure: RIGHT/LEFT HEART CATH AND CORONARY ANGIOGRAPHY;  Surgeon: Dolores Patty, MD;  [facility] LAB;  Service: Cardiovascular;  Laterality: N/A;   RIGHT/LEFT HEART CATH AND CORONARY ANGIOGRAPHY N/A 04/12/2021   Procedure: RIGHT/LEFT HEART CATH AND CORONARY ANGIOGRAPHY;  Surgeon: Dolores Patty, MD;  [facility]: [facility] LAB;  Service: Cardiovascular;  Laterality: N/A;   SKIN SPLIT GRAFT Bilateral 10/24/2019   Procedure: SKIN GRAFT SPLIT THICKNESS LEFT THIGH;  Surgeon: Allena Napoleon, MD;  Location: [facility];  Service: Plastics;  Laterality: Bilateral;   WOUND DEBRIDEMENT Right 08/29/2018   Procedure: Debridement of ulcer on right fifth metatarsal;  Surgeon: Felecia Shelling, DPM;  [facility];  Service: Podiatry;  Laterality: Right;   Social History   Occupational History   Occupation: Mortician  Tobacco Use   Smoking status: Never   Smokeless tobacco: Never  Vaping Use    Vaping Use: Never used  Substance and Sexual Activity   Alcohol use: No   Drug use: No   Sexual activity: Not on file

## 2022-12-07 ENCOUNTER — Ambulatory Visit: Payer: No Typology Code available for payment source | Admitting: Orthopedic Surgery

## 2022-12-09 ENCOUNTER — Other Ambulatory Visit: Payer: Self-pay | Admitting: Family Medicine

## 2022-12-09 DIAGNOSIS — M545 Low back pain, unspecified: Secondary | ICD-10-CM

## 2022-12-09 DIAGNOSIS — M542 Cervicalgia: Secondary | ICD-10-CM

## 2022-12-11 NOTE — Telephone Encounter (Signed)
Last RF--07/26/2022--#30 with 2 refills Last OV---11/30/2022

## 2022-12-12 ENCOUNTER — Encounter (HOSPITAL_COMMUNITY): Payer: Self-pay | Admitting: *Deleted

## 2022-12-12 NOTE — Progress Notes (Signed)
FMLA forms for intermittent FMLA completed, signed by Dr Gala Romney, and faxed to Britta Mccreedy at [phone]. Pt is aware, copy mailed to him

## 2022-12-20 ENCOUNTER — Other Ambulatory Visit: Payer: Self-pay

## 2022-12-20 MED ORDER — ENTRESTO 49-51 MG PO TABS: 1.0000 | ORAL_TABLET | Freq: Two times a day (BID) | ORAL | 3 refills | Status: DC

## 2022-12-20 NOTE — Telephone Encounter (Signed)
Pharmacy sent a fax requesting a refill for Entresto. Pt sees Cardiology. Do you manage this Rx?

## 2023-01-02 ENCOUNTER — Ambulatory Visit (INDEPENDENT_AMBULATORY_CARE_PROVIDER_SITE_OTHER): Payer: No Typology Code available for payment source | Admitting: Urology

## 2023-01-02 ENCOUNTER — Encounter: Payer: Self-pay | Admitting: Urology

## 2023-01-02 VITALS — BP 154/88 | HR 93 | Ht 71.0 in | Wt 291.0 lb

## 2023-01-02 DIAGNOSIS — N138 Other obstructive and reflux uropathy: Secondary | ICD-10-CM | POA: Diagnosis not present

## 2023-01-02 DIAGNOSIS — N401 Enlarged prostate with lower urinary tract symptoms: Secondary | ICD-10-CM | POA: Diagnosis not present

## 2023-01-02 DIAGNOSIS — N529 Male erectile dysfunction, unspecified: Secondary | ICD-10-CM | POA: Diagnosis not present

## 2023-01-02 LAB — MICROSCOPIC EXAMINATION
Crystal Type: NONE SEEN
Crystals: NONE SEEN
Trichomonas, UA: NONE SEEN
Yeast, UA: NONE SEEN

## 2023-01-02 LAB — URINALYSIS, ROUTINE W REFLEX MICROSCOPIC
Bilirubin, UA: NEGATIVE
Glucose, UA: NEGATIVE
Ketones, UA: NEGATIVE
Nitrite, UA: NEGATIVE
Protein,UA: NEGATIVE
RBC, UA: NEGATIVE
Specific Gravity, UA: 1.02 (ref 1.005–1.030)
Urobilinogen, Ur: 0.2 mg/dL (ref 0.2–1.0)
pH, UA: 5 (ref 5.0–7.5)

## 2023-01-02 MED ORDER — TAMSULOSIN HCL 0.4 MG PO CAPS: 0.4000 mg | ORAL_CAPSULE | Freq: Every day | ORAL | 11 refills | Status: DC

## 2023-01-02 NOTE — Progress Notes (Signed)
Assessment: 1. Organic impotence   2. BPH with obstruction/lower urinary tract symptoms     Plan: Continue tamsulosin 0.4 mg daily Trial of injection therapy with TriMix 10/30/1 - rx sent to [facility] Return to office for test injection - AM appointment  Chief Complaint:  Chief Complaint  Patient presents with   Benign Prostatic Hypertrophy    History of Present Illness:  Mitchell [name] is a 52 y.o. male who is seen for further evaluation of BPH with lower urinary tract symptoms, left flank pain, and erectile dysfunction. He has a history of lower urinary tract symptoms including frequency and nocturia for years.  He associates this with his use of torsemide.  No dysuria.  He reported an episode of gross hematuria prior to his hospitalization in March 2024.  Urinalysis at that time showed >50 WBCs, 11-20 RBCs, and rare bacteria.  No culture was obtained.  No further episodes of gross hematuria.  He was previously on tamsulosin. IPSS = 9. PVR = 86 ml  He reported intermittent left flank pain for 6 months. His symptoms are positional and associated with certain movements. CT abdomen and pelvis without contrast from 10/23/2022 showed no renal or ureteral calculi, no renal mass or evidence of obstruction.  He has a history of erectile dysfunction.  He has been unable to achieve an adequate erection for intercourse.  He has previously tried sildenafil without improvement.  He has also tried a vacuum erection device with unsatisfactory results.  He has seen Dr. Benancio Deeds at [facility] urology in 2021 and was prescribed prostaglandin for intracavernosal injection.  He reported minimal results with the dose prescribed and did not follow-up after his visit in November 2021.  PSA 3/24:  1.4 with 24% free  He returns today for scheduled follow-up.  He reports that his urinary symptoms are stable.  He recently ran out of tamsulosin.  He is not having any dysuria or gross hematuria. He  has not seen any results with tadalafil 20 mg as needed.  He continues to have trouble achieving and maintaining his erections.  He is interested in additional treatment.  Portions of the above documentation were copied from a prior visit for review purposes only.   Past Medical History:  Past Medical History:  Diagnosis Date   Bipolar disorder (HCC)    CAD (coronary artery disease)    a. diffuse 3v CAD by cath 2019, medical therapy recommended.   Cataract    forming    Chronic systolic CHF (congestive heart failure) (HCC)    CVA (cerebral infarction)    No residual deficits   Depression    PTSD,    Diabetes mellitus    TYPE II; UNCONTROLLED BY HEMOGLOBIN A1c; STABLE AS  PER DISCHARGE   Headache(784.0)    Herpes simplex of male genitalia    History of colonic polyps    Hyperlipidemia    Hypertension    Myocardial infarction (HCC) 1987   (while playing football)   Obesity    OSA (obstructive sleep apnea)    repeat study 2018 without significant OSA   Pneumonia    Post-cardiac injury syndrome (HCC)    History of cardiac injury from blunt trauma   Pulmonary hypertension (HCC)    a. moderately elevated PASP 07/2019.   PVCs (premature ventricular contractions)    Schizophrenia (HCC)    Goes to [facility]   Sleep apnea    Stroke [facility]) 2005   some left side weakness  Syncope    Recurrent, thought to be vasovagal. Also has h/o frequent PVCs.     Past Surgical History:  Past Surgical History:  Procedure Laterality Date   AMPUTATION Right 01/25/2021   Procedure: RIGHT BELOW KNEE AMPUTATION;  Surgeon: Nadara Mustard, MD;  Location[facility] OR;  Service: Orthopedics;  Laterality: Right;   CARDIAC CATHETERIZATION  12/19/10   DIFFUSE NONOBSTRUCTIVE CAD; NONISCHEMIC CARDIOMYOPATHY; LEFT VENTRICULAR ANGIOGRAM WAS PERFORMED SECONDARY TO  ELEVATED LEFT VENTRICULAR FILLING PRESSURES   COLONOSCOPY     ~ age 4-23   COLONOSCOPY W/ POLYPECTOMY     I & D EXTREMITY  Bilateral 10/24/2019   Procedure: IRRIGATION AND DEBRIDEMENT BILATERAL EXTREMITY WOUND ON FOOT;  Surgeon: Allena Napoleon, MD;  [facility]: [facility];  Service: Plastics;  Laterality: Bilateral;   ICD IMPLANT N/A 01/13/2022   Procedure: ICD IMPLANT;  Surgeon: Marinus Maw, MD;  Location: [facility] INVASIVE CV LAB;  Service: Cardiovascular;  Laterality: N/A;   METATARSAL HEAD EXCISION Right 08/29/2018   Procedure: METATARSAL HEAD RESECTION;  Surgeon: Felecia Shelling, DPM;  Location: [facility];  Service: Podiatry;  Laterality: Right;   METATARSAL OSTEOTOMY Right 08/29/2018   Procedure: SUB FIFTHE METATARSIA RIGHT FOOT;  Surgeon: Felecia Shelling, DPM;  Location: [facility];  Service: Podiatry;  Laterality: Right;   MULTIPLE EXTRACTIONS WITH ALVEOLOPLASTY  01/27/2014   "all my teeth; 4 Quadrants of alveoloplasty   MULTIPLE EXTRACTIONS WITH ALVEOLOPLASTY N/A 01/27/2014   Procedure: EXTRACTION OF TEETH #'1, 2, 3, 4, 5, 6, 7, 8, 9, 10, 11, 12, 13, 14, 15, 16, 17, 20, 21, 22, 23, 24, 25, 26, 27, 28, 29, 31 and 32 WITH ALVEOLOPLASTY;  Surgeon: Charlynne Pander, DDS;  Location: [facility];  Service: Oral Surgery;  Laterality: N/A;   ORIF FINGER / THUMB FRACTURE Right    POLYPECTOMY     RIGHT HEART CATH N/A 01/27/2021   Procedure: RIGHT HEART CATH;  Surgeon: Dolores Patty, MD;  Location: [facility] INVAS[facility] CV LAB;  Service: Cardiovascular;  Laterality: N/A;   RIGHT/LEFT HEART CATH AND CORONARY ANGIOGRAPHY N/A 09/26/2017   Procedure: RIGHT/LEFT HEART CATH AND CORONARY ANGIOGRAPHY;  Surgeon: Dolores Patty, MD;  [facility]: [facility]VAS[facility] CV LAB;  Service: Cardiovascular;  Laterality: N/A;   RIGHT/LEFT HEART CATH AND CORONARY ANGIOGRAPHY N/A 04/12/2021   Procedure: RIGHT/LEFT HEART CATH AND CORONARY ANGIOGRAPHY;  Surgeon: Dolores Patty, MD;  Location: [facility] INVASIVE CV LAB;  Service: Cardiovascular;  Laterality: N/A;   SKIN SPLIT GRAFT Bilateral 10/24/2019   Procedure: SKIN GRAFT SPLIT THICKNESS LEFT THIGH;  Surgeon: Allena Napoleon, MD;   Location: [facility];  Service: Plastics;  Laterality: Bilateral;   WOUND DEBRIDEMENT Right 08/29/2018   Procedure: Debridement of ulcer on right fifth metatarsal;  Surgeon: Felecia Shelling, DPM;  Location: [facility];  Service: Podiatry;  Laterality: Right;    Allergies:  Allergies  Allergen Reactions   Farxiga [Dapagliflozin] Other (See Comments)    Patient reports loss of consciousness   Nsaids Anaphylaxis and Rash    Able to tolerate aspirin    Trulicity [Dulaglutide] Nausea And Vomiting    Family History:  Family History  Problem Relation Age of Onset   Heart disease Mother        MI   Heart failure Mother    Diabetes Mother        ALSO IN MOST OF HIS SIBLINGS; 2 UNLCES HAVE ALSO PASSED AWAY FROM DM   Cardiomyopathy Mother    Cancer -  Ovarian Mother    Ovarian cancer Mother    Heart disease Father    Hypertension Father    Diabetes Father    Diabetes Sister    Diabetes Brother    Colon cancer Paternal Uncle    Colon cancer Paternal Uncle    Colon polyps Neg Hx    Esophageal cancer Neg Hx    Rectal cancer Neg Hx    Stomach cancer Neg Hx     Social History:  Social History   Tobacco Use   Smoking status: Never   Smokeless tobacco: Never  Vaping Use   Vaping Use: Never used  Substance Use Topics   Alcohol use: No   Drug use: No    ROS: Constitutional:  Negative for fever, chills, weight loss CV: Negative for chest pain, previous MI, hypertension Respiratory:  Negative for shortness of breath, wheezing, sleep apnea, frequent cough GI:  Negative for nausea, vomiting, bloody stool, GERD  Physical exam: BP (!) 154/88   Pulse 93   Ht 5\' 11"  (1.803 m)   Wt 291 lb (132 kg)   BMI 40.59 kg/m  GENERAL APPEARANCE:  Well appearing, well developed, well nourished, NAD HEENT:  Atraumatic, normocephalic, oropharynx clear NECK:  Supple without lymphadenopathy or thyromegaly ABDOMEN:  Soft, non-tender, no masses EXTREMITIES:  Moves all extremities well, without clubbing,  cyanosis, or edema NEUROLOGIC:  Alert and oriented x 3, normal gait, CN II-XII grossly intact MENTAL STATUS:  appropriate BACK:  Non-tender to palpation, No CVAT SKIN:  Warm, dry, and intact   Results: U/A: 0-5 WBC, 0-2 RBC, few bacteria

## 2023-01-08 ENCOUNTER — Ambulatory Visit: Payer: No Typology Code available for payment source | Admitting: Urology

## 2023-01-08 ENCOUNTER — Ambulatory Visit (INDEPENDENT_AMBULATORY_CARE_PROVIDER_SITE_OTHER): Payer: No Typology Code available for payment source | Admitting: Orthopedic Surgery

## 2023-01-08 DIAGNOSIS — L97521 Non-pressure chronic ulcer of other part of left foot limited to breakdown of skin: Secondary | ICD-10-CM | POA: Diagnosis not present

## 2023-01-10 ENCOUNTER — Ambulatory Visit (INDEPENDENT_AMBULATORY_CARE_PROVIDER_SITE_OTHER): Payer: No Typology Code available for payment source | Admitting: Urology

## 2023-01-10 ENCOUNTER — Encounter: Payer: Self-pay | Admitting: Urology

## 2023-01-10 VITALS — BP 151/77 | HR 98 | Ht 71.0 in | Wt 291.0 lb

## 2023-01-10 DIAGNOSIS — N529 Male erectile dysfunction, unspecified: Secondary | ICD-10-CM

## 2023-01-10 NOTE — Progress Notes (Signed)
Assessment: 1. Organic impotence     Plan: Patient instructed on injection technique and information provided He will try TriMix 10/30/1 0.3 ml per injection at home and let me know results Return to office in 6 weeks  Chief Complaint:  Chief Complaint  Patient presents with   Erectile Dysfunction    History of Present Illness:  Mitchell [name] is a 52 y.o. male who is seen for further evaluation of BPH with lower urinary tract symptoms, left flank pain, and erectile dysfunction. He has a history of lower urinary tract symptoms including frequency and nocturia for years.  He associates this with his use of torsemide.  No dysuria.  He reported an episode of gross hematuria prior to his hospitalization in March 2024.  Urinalysis at that time showed >50 WBCs, 11-20 RBCs, and rare bacteria.  No culture was obtained.  No further episodes of gross hematuria.  He was previously on tamsulosin. IPSS = 9. PVR = 86 ml  He reported intermittent left flank pain for 6 months. His symptoms are positional and associated with certain movements. CT abdomen and pelvis without contrast from 10/23/2022 showed no renal or ureteral calculi, no renal mass or evidence of obstruction.  He has a history of erectile dysfunction.  He has been unable to achieve an adequate erection for intercourse.  He has previously tried sildenafil without improvement.  He has also tried a vacuum erection device with unsatisfactory results.  He has seen Dr. Benancio Deeds at [facility] urology in 2021 and was prescribed prostaglandin for intracavernosal injection.  He reported minimal results with the dose prescribed and did not follow-up after his visit in November 2021.  PSA 3/24:  1.4 with 24% free  At his visit on 01/02/23, he reported that his urinary symptoms were stable.  He had recently run out of tamsulosin.  He was not having any dysuria or gross hematuria. He had not seen any results with tadalafil 20 mg as needed.  He  continued to have trouble achieving and maintaining his erections. He presents today for a test dose of Trimix.  Portions of the above documentation were copied from a prior visit for review purposes only.   Past Medical History:  Past Medical History:  Diagnosis Date   Bipolar disorder (HCC)    CAD (coronary artery disease)    a. diffuse 3v CAD by cath 2019, medical therapy recommended.   Cataract    forming    Chronic systolic CHF (congestive heart failure) (HCC)    CVA (cerebral infarction)    No residual deficits   Depression    PTSD,    Diabetes mellitus    TYPE II; UNCONTROLLED BY HEMOGLOBIN A1c; STABLE AS  PER DISCHARGE   Headache(784.0)    Herpes simplex of male genitalia    History of colonic polyps    Hyperlipidemia    Hypertension    Myocardial infarction (HCC) 1987   (while playing football)   Obesity    OSA (obstructive sleep apnea)    repeat study 2018 without significant OSA   Pneumonia    Post-cardiac injury syndrome (HCC)    History of cardiac injury from blunt trauma   Pulmonary hypertension (HCC)    a. moderately elevated PASP 07/2019.   PVCs (premature ventricular contractions)    Schizophrenia (HCC)    Goes to [facility]   Sleep apnea    Stroke [facility]) 2005   some left side weakness   Syncope    Recurrent,  thought to be vasovagal. Also has h/o frequent PVCs.     Past Surgical History:  Past Surgical History:  Procedure Laterality Date   AMPUTATION Right 01/25/2021   Procedure: RIGHT BELOW KNEE AMPUTATION;  Surgeon: Nadara Mustard, MD;  Location: [facility] OR;  Service: Orthopedics;  Laterality: Right;   CARDIAC CATHETERIZATION  12/19/10   DIFFUSE NONOBSTRUCTIVE CAD; NONISCHEMIC CARDIOMYOPATHY; LEFT VENTRICULAR ANGIOGRAM WAS PERFORMED SECONDARY TO  ELEVATED LEFT VENTRICULAR FILLING PRESSURES   COLONOSCOPY     ~ age [age]   COLONOSCOPY W/ POLYPECTOMY     I & D EXTREMITY Bilateral 10/24/2019   Procedure: IRRIGATION AND DEBRIDEMENT  BILATERAL EXTREMITY WOUND ON FOOT;  Surgeon: Allena Napoleon, MD;  Location: [facility] OR;  Service: Plastics;  Laterality: Bilateral;   ICD IMPLANT N/A 01/13/2022   Procedure: ICD IMPLANT;  Surgeon: Marinus Maw, MD;  Location: [facility] INVASIVE CV LAB;  Service: Cardiovascular;  Laterality: N/A;   METATARSAL HEAD EXCISION Right 08/29/2018   Procedure: METATARSAL HEAD RESECTION;  Surgeon: Felecia Shelling, DPM;  Location: [facility] OR;  Service: Podiatry;  Laterality: Right;   METATARSAL OSTEOTOMY Right 08/29/2018   Procedure: SUB FIFTHE METATARSIA RIGHT FOOT;  Surgeon: Felecia Shelling, DPM;  Location: [facility] OR;  Service: Podiatry;  Laterality: Right;   MULTIPLE EXTRACTIONS WITH ALVEOLOPLASTY  01/27/2014   "all my teeth; 4 Quadrants of alveoloplasty   MULTIPLE EXTRACTIONS WITH ALVEOLOPLASTY N/A 01/27/2014   Procedure: EXTRACTION OF TEETH #'1, 2, 3, 4, 5, 6, 7, 8, 9, 10, 11, 12, 13, 14, 15, 16, 17, 20, 21, 22, 23, 24, 25, 26, 27, 28, 29, 31 and 32 WITH ALVEOLOPLASTY;  Surgeon: Charlynne Pander, DDS;  Location: [facility] OR;  Service: Oral Surgery;  Laterality: N/A;   ORIF FINGER / THUMB FRACTURE Right    POLYPECTOMY     RIGHT HEART CATH N/A 01/27/2021   Procedure: RIGHT HEART CATH;  Surgeon: Dolores Patty, MD;  [facility]: [facility] INVASIVE CV LAB;  Service: Cardiovascular;  Laterality: N/A;   RIGHT/LEFT HEART CATH AND CORONARY ANGIOGRAPHY N/A 09/26/2017   Procedure: RIGHT/LEFT HEART CATH AND CORONARY ANGIOGRAPHY;  Surgeon: Dolores Patty, MD;  [facility]: [facility] INVASIVE CV LAB;  Service: Cardiovascular;  Laterality: N/A;   RIGHT/LEFT HEART CATH AND CORONARY ANGIOGRAPHY N/A 04/12/2021   Procedure: RIGHT/LEFT HEART CATH AND CORONARY ANGIOGRAPHY;  Surgeon: Dolores Patty, MD;  Location: [facility] INVASIVE CV LAB;  Service: Cardiovascular;  Laterality: N/A;   SKIN SPLIT GRAFT Bilateral 10/24/2019   Procedure: SKIN GRAFT SPLIT THICKNESS LEFT THIGH;  Surgeon: Allena Napoleon, MD;  Location: [facility] OR;  Service: Plastics;  Laterality: Bilateral;    WOUND DEBRIDEMENT Right 08/29/2018   Procedure: Debridement of ulcer on right fifth metatarsal;  Surgeon: Felecia Shelling, DPM;  Location: [facility] OR;  Service: Podiatry;  Laterality: Right;    Allergies:  Allergies  Allergen Reactions   Farxiga [Dapagliflozin] Other (See Comments)    Patient reports loss of consciousness   Nsaids Anaphylaxis and Rash    Able to tolerate aspirin    Trulicity [Dulaglutide] Nausea And Vomiting    Family History:  Family History  Problem Relation Age of Onset   Heart disease Mother        MI   Heart failure Mother    Diabetes Mother        ALSO IN MOST OF HIS SIBLINGS; 2 UNLCES HAVE ALSO PASSED AWAY FROM DM   Cardiomyopathy Mother    Cancer - Ovarian Mother  Ovarian cancer Mother    Heart disease Father    Hypertension Father    Diabetes Father    Diabetes Sister    Diabetes Brother    Colon cancer Paternal Uncle    Colon cancer Paternal Uncle    Colon polyps Neg Hx    Esophageal cancer Neg Hx    Rectal cancer Neg Hx    Stomach cancer Neg Hx     Social History:  Social History   Tobacco Use   Smoking status: Never   Smokeless tobacco: Never  Vaping Use   Vaping Use: Never used  Substance Use Topics   Alcohol use: No   Drug use: No    ROS: Constitutional:  Negative for fever, chills, weight loss CV: Negative for chest pain, previous MI, hypertension Respiratory:  Negative for shortness of breath, wheezing, sleep apnea, frequent cough GI:  Negative for nausea, vomiting, bloody stool, GERD  Physical exam: BP (!) 151/77   Pulse 98   Ht 5\' 11"  (1.803 m)   Wt 291 lb (132 kg)   BMI 40.59 kg/m  GENERAL APPEARANCE:  Well appearing, well developed, well nourished, NAD HEENT:  Atraumatic, normocephalic, oropharynx clear NECK:  Supple without lymphadenopathy or thyromegaly ABDOMEN:  Soft, non-tender, no masses EXTREMITIES:  Moves all extremities well, without clubbing, cyanosis, or edema NEUROLOGIC:  Alert and oriented x 3, normal  gait, CN II-XII grossly intact MENTAL STATUS:  appropriate BACK:  Non-tender to palpation, No CVAT SKIN:  Warm, dry, and intact  Results: None  Procedure:  Penile Injection  Under sterile conditions 0.2 milliliters of triple mix 10/30/1  is injected into the penile corporeal body.  The patient is then examined after 10 minutes.  The injection  induces a unsatisfactory erection for vaginal penetration.  The patient is instructed on proper injection technique under sterile conditions.  Most importantly he realizes if he ever has a painful prolonged erection also known as a priapism that he should seek care within three hours.  Failure to seek care could lead to permanent penile damage, penile fibrosis, penile gangrene, or possible loss of penis.  He reports good good understanding.  All questions are answered to his satisfaction.

## 2023-01-13 NOTE — Patient Instructions (Addendum)
It was good to see you again today, I will be in touch with your labs Please be sure to get an annual eye exam to monitor for any diabetic changes of your retinas  Assuming all is ok let's plan to visit in 6 months
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
## 2023-01-15 ENCOUNTER — Ambulatory Visit (INDEPENDENT_AMBULATORY_CARE_PROVIDER_SITE_OTHER): Payer: No Typology Code available for payment source

## 2023-01-15 DIAGNOSIS — I5022 Chronic systolic (congestive) heart failure: Secondary | ICD-10-CM | POA: Diagnosis not present

## 2023-01-16 LAB — CUP PACEART REMOTE DEVICE CHECK
Battery Remaining Longevity: 126 mo
Battery Voltage: 3.01 V
Brady Statistic RV Percent Paced: 0 %
Date Time Interrogation Session: 20240617072401
HighPow Impedance: 53 Ohm
Implantable Lead Connection Status: 753985
Implantable Lead Implant Date: 20230616
Implantable Lead Location: 753860
Implantable Lead Model: 6935
Implantable Pulse Generator Implant Date: 20230616
Lead Channel Impedance Value: 361 Ohm
Lead Channel Impedance Value: 437 Ohm
Lead Channel Pacing Threshold Amplitude: 0.5 V
Lead Channel Pacing Threshold Pulse Width: 0.4 ms
Lead Channel Sensing Intrinsic Amplitude: 26.75 mV
Lead Channel Sensing Intrinsic Amplitude: 26.75 mV
Lead Channel Setting Pacing Amplitude: 2 V
Lead Channel Setting Pacing Pulse Width: 0.4 ms
Lead Channel Setting Sensing Sensitivity: 0.3 mV
Zone Setting Status: 755011

## 2023-01-19 ENCOUNTER — Telehealth: Payer: Self-pay | Admitting: Orthopedic Surgery

## 2023-01-19 NOTE — Telephone Encounter (Signed)
Patient called. Says he has blisters on his leg. Would like to be seen today. His call back number is [phone]

## 2023-01-19 NOTE — Telephone Encounter (Signed)
I called and sw pt he advised that he should not wear his prosthetic, continue with his shrinker and made an appt for Monday with Dr. Lajoyce Corners. He will call if he has any other questions or should things change in any way. Pt states there are 2 small intact blisters on his residual limb and he believes this is from friction from his prosthetic

## 2023-01-20 ENCOUNTER — Encounter (HOSPITAL_BASED_OUTPATIENT_CLINIC_OR_DEPARTMENT_OTHER): Payer: Self-pay | Admitting: Emergency Medicine

## 2023-01-20 ENCOUNTER — Emergency Department (HOSPITAL_BASED_OUTPATIENT_CLINIC_OR_DEPARTMENT_OTHER)
Admission: EM | Admit: 2023-01-20 | Discharge: 2023-01-20 | Disposition: A | Discharge status: Home / Self Care | Payer: No Typology Code available for payment source | Attending: Emergency Medicine | Admitting: Emergency Medicine

## 2023-01-20 ENCOUNTER — Other Ambulatory Visit: Payer: Self-pay

## 2023-01-20 ENCOUNTER — Emergency Department (HOSPITAL_BASED_OUTPATIENT_CLINIC_OR_DEPARTMENT_OTHER): Payer: No Typology Code available for payment source

## 2023-01-20 DIAGNOSIS — E119 Type 2 diabetes mellitus without complications: Secondary | ICD-10-CM | POA: Insufficient documentation

## 2023-01-20 DIAGNOSIS — I2510 Atherosclerotic heart disease of native coronary artery without angina pectoris: Secondary | ICD-10-CM | POA: Diagnosis not present

## 2023-01-20 DIAGNOSIS — Z794 Long term (current) use of insulin: Secondary | ICD-10-CM | POA: Diagnosis not present

## 2023-01-20 DIAGNOSIS — Z7982 Long term (current) use of aspirin: Secondary | ICD-10-CM | POA: Diagnosis not present

## 2023-01-20 DIAGNOSIS — T8743 Infection of amputation stump, right lower extremity: Secondary | ICD-10-CM | POA: Insufficient documentation

## 2023-01-20 DIAGNOSIS — Z7901 Long term (current) use of anticoagulants: Secondary | ICD-10-CM | POA: Insufficient documentation

## 2023-01-20 DIAGNOSIS — T8740 Infection of amputation stump, unspecified extremity: Secondary | ICD-10-CM

## 2023-01-20 DIAGNOSIS — I5022 Chronic systolic (congestive) heart failure: Secondary | ICD-10-CM | POA: Insufficient documentation

## 2023-01-20 DIAGNOSIS — Z79899 Other long term (current) drug therapy: Secondary | ICD-10-CM | POA: Diagnosis not present

## 2023-01-20 DIAGNOSIS — I110 Hypertensive heart disease with heart failure: Secondary | ICD-10-CM | POA: Diagnosis not present

## 2023-01-20 LAB — AEROBIC/ANAEROBIC CULTURE W GRAM STAIN (SURGICAL/DEEP WOUND): Gram Stain: NONE SEEN

## 2023-01-20 MED ORDER — CEPHALEXIN 250 MG PO CAPS
1000.0000 mg | ORAL_CAPSULE | Freq: Once | ORAL | Status: AC
  Administered 2023-01-20: 1000 mg via ORAL
  Filled 2023-01-20: qty 4

## 2023-01-20 MED ORDER — CEPHALEXIN 500 MG PO CAPS: 500.0000 mg | ORAL_CAPSULE | Freq: Four times a day (QID) | ORAL | 0 refills | Status: DC

## 2023-01-20 NOTE — ED Provider Notes (Signed)
[facility]ENCY [facility] Provider Note: [name], MD, FACEP  CSN: [id number] MRN: [id number] ARRIVAL: 01/20/23 at 0120 ROOM: MH12/MH12   CHIEF COMPLAINT  Blister   HISTORY OF PRESENT ILLNESS  01/20/23 3:24 AM Mitchell [name] is a 52 y.o. male who is status post right BKA 01/25/2021.  He is here with blisters on his stump which she noticed 2 days ago and they have increased in size.  He has an appointment with Dr. Lajoyce Corners in 2 days but was told to come to the ED if blisters develop.  He has not been wearing a prosthetic since the blisters developed.   Past Medical History:  Diagnosis Date   Bipolar disorder (HCC)    CAD (coronary artery disease)    a. diffuse 3v CAD by cath 2019, medical therapy recommended.   Cataract    forming    Chronic systolic CHF (congestive heart failure) (HCC)    CVA (cerebral infarction)    No residual deficits   Depression    PTSD,    Diabetes mellitus    TYPE II; UNCONTROLLED BY HEMOGLOBIN A1c; STABLE AS  PER DISCHARGE   Headache(784.0)    Herpes simplex of male genitalia    History of colonic polyps    Hyperlipidemia    Hypertension    Myocardial infarction (HCC) 1987   (while playing football)   Obesity    OSA (obstructive sleep apnea)    repeat study 2018 without significant OSA   Pneumonia    Post-cardiac injury syndrome (HCC)    History of cardiac injury from blunt trauma   Pulmonary hypertension (HCC)    a. moderately elevated PASP 07/2019.   PVCs (premature ventricular contractions)    Schizophrenia (HCC)    Goes to [facility]   Sleep apnea    Stroke [facility]) 2005   some left side weakness   Syncope    Recurrent, thought to be vasovagal. Also has h/o frequent PVCs.     Past Surgical History:  Procedure Laterality Date   AMPUTATION Right 01/25/2021   Procedure: RIGHT BELOW KNEE AMPUTATION;  Surgeon: Nadara Mustard, MD;  Location: [facility] OR;  Service: Orthopedics;  Laterality: Right;   CARDIAC CATHETERIZATION   12/19/2010   DIFFUSE NONOBSTRUCTIVE CAD; NONISCHEMIC CARDIOMYOPATHY; LEFT VENTRICULAR ANGIOGRAM WAS PERFORMED SECONDARY TO  ELEVATED LEFT VENTRICULAR FILLING PRESSURES   COLONOSCOPY     ~ age 7-23   COLONOSCOPY W/ POLYPECTOMY     I & D EXTREMITY Bilateral 10/24/2019   Procedure: IRRIGATION AND DEBRIDEMENT BILATERAL EXTREMITY WOUND ON FOOT;  Surgeon: Allena Napoleon, MD;  [facility]: [facility] OR;  Service: Plastics;  Laterality: Bilateral;   ICD IMPLANT N/A 01/13/2022   Procedure: ICD IMPLANT;  Surgeon: Marinus Maw, MD;  Location: [facility] INVASIVE CV LAB;  Service: Cardiovascular;  Laterality: N/A;   METATARSAL HEAD EXCISION Right 08/29/2018   Procedure: METATARSAL HEAD RESECTION;  Surgeon: Felecia Shelling, DPM;  Location: [facility] OR;  Service: Podiatry;  Laterality: Right;   METATARSAL OSTEOTOMY Right 08/29/2018   Procedure: SUB FIFTHE METATARSIA RIGHT FOOT;  Surgeon: Felecia Shelling, DPM;  Location: [facility] OR;  Service: Podiatry;  Laterality: Right;   MULTIPLE EXTRACTIONS WITH ALVEOLOPLASTY  01/27/2014   "all my teeth; 4 Quadrants of alveoloplasty   MULTIPLE EXTRACTIONS WITH ALVEOLOPLASTY N/A 01/27/2014   Procedure: EXTRACTION OF TEETH #'1, 2, 3, 4, 5, 6, 7, 8, 9, 10, 11, 12, 13, 14, 15, 16, 17, 20, 21, 22, 23, 24, 25, 26, 27,  28, 29, 31 and 32 WITH ALVEOLOPLASTY;  Surgeon: Charlynne Pander, DDS;  Location: [facility] OR;  Service: Oral Surgery;  Laterality: N/A;   ORIF FINGER / THUMB FRACTURE Right    POLYPECTOMY     RIGHT HEART CATH N/A 01/27/2021   Procedure: RIGHT HEART CATH;  Surgeon: Dolores Patty, MD;  [facility]: [facility] INVASIVE CV LAB;  Service: Cardiovascular;  Laterality: N/A;   RIGHT/LEFT HEART CATH AND CORONARY ANGIOGRAPHY N/A 09/26/2017   Procedure: RIGHT/LEFT HEART CATH AND CORONARY ANGIOGRAPHY;  Surgeon: Dolores Patty, MD;  [facility] INVASIVE CV LAB;  Service: Cardiovascular;  Laterality: N/A;   RIGHT/LEFT HEART CATH AND CORONARY ANGIOGRAPHY N/A 04/12/2021   Procedure: RIGHT/LEFT HEART CATH  AND CORONARY ANGIOGRAPHY;  Surgeon: Dolores Patty, MD;  [facility] INVASIVE CV LAB;  Service: Cardiovascular;  Laterality: N/A;   SKIN SPLIT GRAFT Bilateral 10/24/2019   Procedure: SKIN GRAFT SPLIT THICKNESS LEFT THIGH;  Surgeon: Allena Napoleon, MD;  Location: [facility] OR;  Service: Plastics;  Laterality: Bilateral;   WOUND DEBRIDEMENT Right 08/29/2018   Procedure: Debridement of ulcer on right fifth metatarsal;  Surgeon: Felecia Shelling, DPM;  Location: [facility] OR;  Service: Podiatry;  Laterality: Right;    Family History  Problem Relation Age of Onset   Heart disease Mother        MI   Heart failure Mother    Diabetes Mother        ALSO IN MOST OF HIS SIBLINGS; 2 UNLCES HAVE ALSO PASSED AWAY FROM DM   Cardiomyopathy Mother    Cancer - Ovarian Mother    Ovarian cancer Mother    Heart disease Father    Hypertension Father    Diabetes Father    Diabetes Sister    Diabetes Brother    Colon cancer Paternal Uncle    Colon cancer Paternal Uncle    Colon polyps Neg Hx    Esophageal cancer Neg Hx    Rectal cancer Neg Hx    Stomach cancer Neg Hx     Social History   Tobacco Use   Smoking status: Never   Smokeless tobacco: Never  Vaping Use   Vaping Use: Never used  Substance Use Topics   Alcohol use: No   Drug use: No    Prior to Admission medications   Medication Sig Start Date End Date Taking? Authorizing Provider  cephALEXin (KEFLEX) 500 MG capsule Take 1 capsule (500 mg total) by mouth 4 (four) times daily. 01/20/23  Yes Loula Marcella, MD  Accu-Chek Softclix Lancets lancets Use to test blood sugars up to 4 times daily as needed. 12/08/21   Orland Mustard, MD  acetaminophen-codeine (TYLENOL #3) 300-30 MG tablet Take 1 tablet by mouth every 6 (six) hours as needed for moderate pain. 06/15/22   [name], Gwenlyn Found, MD  amitriptyline (ELAVIL) 10 MG tablet Take 1 tablet (10 mg total) by mouth at bedtime. 07/26/22   [name], Gwenlyn Found, MD  Ascorbic Acid (VITAMIN C PO) Take 1 tablet by  mouth daily.    [provider]  aspirin 81 MG EC tablet Take 1 tablet (81 mg total) by mouth daily. 04/13/21   Danford, Earl Lites, MD  blood glucose meter kit and supplies Dispense based on patient and insurance preference. Use up to four times daily as directed. (FOR ICD-10 E10.9, E11.9). 06/15/22   [name], Gwenlyn Found, MD  Blood Glucose Monitoring Suppl (BLOOD GLUCOSE MONITOR SYSTEM) w/Device KIT Use to test blood sugar up to 4  times daily as needed. 12/08/21   Arby Barrette, MD  carvedilol (COREG) 6.25 MG tablet Take 1 tablet (6.25 mg total) by mouth 2 (two) times daily with a meal. 10/30/22   Bensimhon, [name], MD  Cholecalciferol (VITAMIN D-3 PO) Take 1 capsule by mouth daily.    [provider]  clopidogrel (PLAVIX) 75 MG tablet Take 1 tablet (75 mg total) by mouth daily. 07/26/22   Bensimhon, [name], MD  gabapentin (NEURONTIN) 100 MG capsule Take 1 capsule (100 mg total) by mouth 2 (two) times daily. 07/26/22   [name], Gwenlyn Found, MD  glipiZIDE (GLUCOTROL XL) 5 MG 24 hr tablet Take 1 tablet (5 mg total) by mouth daily with breakfast. Take with first meal of the day 11/10/22   [name], Gwenlyn Found, MD  glucose blood (ACCU-CHEK GUIDE) test strip Use to test blood sugars up to 4 times daily as needed. 12/08/21   Orland Mustard, MD  Insulin Glargine [facility]) 100 UNIT/ML Inject 42 Units into the skin daily. 09/29/22   [name], Gwenlyn Found, MD  methocarbamol (ROBAXIN) 500 MG tablet TAKE 1 TABLET BY MOUTH EVERY 6 HOURS AS NEEDED FOR MUSCLE SPASM 12/11/22   [name], Gwenlyn Found, MD  pantoprazole (PROTONIX) 40 MG tablet Take 1 tablet (40 mg total) by mouth daily as needed (acid reflux). 02/13/22   [name], Gwenlyn Found, MD  potassium chloride SA (KLOR-CON M) 20 MEQ tablet Take 1 tablet (20 mEq total) by mouth daily. 05/05/22   Milford, Anderson Malta, FNP  rosuvastatin (CRESTOR) 40 MG tablet Take 1 tablet (40 mg total) by mouth daily. 07/26/22   Bensimhon, [name], MD  sacubitril-valsartan  (ENTRESTO) 49-51 MG Take 1 tablet by mouth 2 (two) times daily. 12/20/22   [name], Gwenlyn Found, MD  spironolactone (ALDACTONE) 25 MG tablet Take 0.5 tablets (12.5 mg total) by mouth daily. 10/27/22   Albertine Grates, MD  tadalafil (CIALIS) 20 MG tablet Take 1 tablet (20 mg total) by mouth daily as needed for erectile dysfunction. 11/21/22   Stoneking, Danford Bad., MD  tamsulosin (FLOMAX) 0.4 MG CAPS capsule Take 1 capsule (0.4 mg total) by mouth daily after supper. 01/02/23   Stoneking, Danford Bad., MD  torsemide (DEMADEX) 20 MG tablet Take 1 tablet (20 mg total) by mouth daily. 10/28/22   Albertine Grates, MD    Allergies Marcelline Deist [dapagliflozin], Nsaids, and Trulicity [dulaglutide]   REVIEW OF SYSTEMS  Negative except as noted here or in the History of Present Illness.   PHYSICAL EXAMINATION  Initial Vital Signs Blood pressure (!) 127/95, pulse 82, temperature 97.8 F (36.6 C), temperature source Oral, resp. rate 18, SpO2 100 %.  Examination General: Well-developed, well-nourished male in no acute distress; appearance consistent with age of record HENT: normocephalic; atraumatic Eyes: pupils equal, round and reactive to light; extraocular muscles intact Neck: supple Heart: regular rate and rhythm Lungs: clear to auscultation bilaterally Abdomen: soft; nondistended; nontender; bowel sounds present Extremities: Well-healed right BKA stump with chronic appearing thickening of the skin, warmth, and 2 small bullae Neurologic: Awake, alert and oriented; motor function intact in all extremities and symmetric; no facial droop Skin: Warm and dry Psychiatric: Normal mood and affect   RESULTS  Summary of this visit's results, reviewed and interpreted by myself:   EKG Interpretation  Date/Time:    Ventricular Rate:    PR Interval:    QRS Duration:   QT Interval:    QTC Calculation:   R Axis:     Text Interpretation:  Laboratory Studies: No results found. However, due to the size of the  patient record, not all encounters were searched. Please check Results Review for a complete set of results. Imaging Studies: DG Tibia/Fibula Right  Result Date: 01/20/2023 CLINICAL DATA:  Enlarging blisters on amputated leg. EXAM: RIGHT TIBIA AND FIBULA - 2 VIEW COMPARISON:  Radiograph 03/08/2022 FINDINGS: Two small blisters are seen at the inferior aspect of the right BKA stump. There is adjacent soft tissue swelling. No definite soft tissue gas. No radiographic evidence of osteomyelitis. No acute fracture or dislocation IMPRESSION: Two small blisters are seen at the inferior aspect of the right BKA stump with adjacent soft tissue swelling. No radiographic evidence of osteomyelitis. Electronically Signed   By: Minerva Fester M.D.   On: 01/20/2023 02:28    ED COURSE and MDM  Nursing notes, initial and subsequent vitals signs, including pulse oximetry, reviewed and interpreted by myself.  Vitals:   01/20/23 0147  BP: (!) 127/95  Pulse: 82  Resp: 18  Temp: 97.8 F (36.6 C)  TempSrc: Oral  SpO2: 100%   Medications  cephALEXin (KEFLEX) capsule 1,000 mg (has no administration in time range)   The warmth present on the patient's stump is concerning for stump cellulitis.  There is no evidence of osteomyelitis seen on the radiograph.  The fluid aspirated from the bullae was clear and not purulent.  We will start him on Keflex and he has an appointment with Dr. Lajoyce Corners as noted the day after tomorrow.   PROCEDURES  Procedures ASPIRATION After verbal consent was obtained the patient's stump was prepped with chlorhexidine.  An 18-gauge needle was then used to aspirate the 2 bullae.  This yielded about 1 mL of clear straw-colored fluid.  This fluid was sent for aerobic and anaerobic culture.  The patient tolerated this well and there were no immediate complications.  ED DIAGNOSES     ICD-10-CM   1. Amputation stump infection (HCC)  T87.40          Mitchell [name], Mitchell Ruiz, MD 01/20/23 [phone]

## 2023-01-20 NOTE — ED Triage Notes (Addendum)
Pt here for blister's on his amputated leg, pt first noticed these yesterday and states they got bigger today. PT called Dr Rosann Auerbach office and has appointment Monday. Per pt, he was told if it ever blisters to come to ED. Pt has not been wearing prosthetic since noticing blisters. Pt has 2 blisters to bottom of stump, also warm to touch

## 2023-01-22 ENCOUNTER — Encounter: Payer: Self-pay | Admitting: Orthopedic Surgery

## 2023-01-22 ENCOUNTER — Ambulatory Visit (INDEPENDENT_AMBULATORY_CARE_PROVIDER_SITE_OTHER): Payer: No Typology Code available for payment source | Admitting: Orthopedic Surgery

## 2023-01-22 ENCOUNTER — Ambulatory Visit (INDEPENDENT_AMBULATORY_CARE_PROVIDER_SITE_OTHER): Payer: No Typology Code available for payment source | Admitting: Family Medicine

## 2023-01-22 VITALS — BP 122/80 | HR 66 | Temp 97.8°F | Resp 18

## 2023-01-22 DIAGNOSIS — E1169 Type 2 diabetes mellitus with other specified complication: Secondary | ICD-10-CM

## 2023-01-22 DIAGNOSIS — L97911 Non-pressure chronic ulcer of unspecified part of right lower leg limited to breakdown of skin: Secondary | ICD-10-CM

## 2023-01-22 DIAGNOSIS — H547 Unspecified visual loss: Secondary | ICD-10-CM | POA: Diagnosis not present

## 2023-01-22 DIAGNOSIS — E782 Mixed hyperlipidemia: Secondary | ICD-10-CM | POA: Diagnosis not present

## 2023-01-22 DIAGNOSIS — I10 Essential (primary) hypertension: Secondary | ICD-10-CM | POA: Diagnosis not present

## 2023-01-22 DIAGNOSIS — Z89511 Acquired absence of right leg below knee: Secondary | ICD-10-CM | POA: Diagnosis not present

## 2023-01-22 DIAGNOSIS — Z794 Long term (current) use of insulin: Secondary | ICD-10-CM | POA: Diagnosis not present

## 2023-01-22 LAB — CBC
HCT: 41.9 % (ref 39.0–52.0)
Hemoglobin: 13.4 g/dL (ref 13.0–17.0)
MCHC: 31.9 g/dL (ref 30.0–36.0)
MCV: 92.1 fl (ref 78.0–100.0)
Platelets: 149 10*3/uL — ABNORMAL LOW (ref 150.0–400.0)
RBC: 4.55 Mil/uL (ref 4.22–5.81)
RDW: 13.6 % (ref 11.5–15.5)
WBC: 8.1 10*3/uL (ref 4.0–10.5)

## 2023-01-22 LAB — COMPREHENSIVE METABOLIC PANEL
ALT: 10 U/L (ref 0–53)
AST: 17 U/L (ref 0–37)
Albumin: 4.1 g/dL (ref 3.5–5.2)
Alkaline Phosphatase: 71 U/L (ref 39–117)
BUN: 28 mg/dL — ABNORMAL HIGH (ref 6–23)
CO2: 33 mEq/L — ABNORMAL HIGH (ref 19–32)
Calcium: 9.5 mg/dL (ref 8.4–10.5)
Chloride: 103 mEq/L (ref 96–112)
Creatinine, Ser: 1.66 mg/dL — ABNORMAL HIGH (ref 0.40–1.50)
GFR: 46.46 mL/min — ABNORMAL LOW (ref >60.00)
Glucose, Bld: 132 mg/dL — ABNORMAL HIGH (ref 70–99)
Potassium: 4.4 mEq/L (ref 3.5–5.1)
Sodium: 144 mEq/L (ref 135–145)
Total Bilirubin: 0.3 mg/dL (ref 0.2–1.2)
Total Protein: 7.2 g/dL (ref 6.0–8.3)

## 2023-01-22 LAB — AEROBIC/ANAEROBIC CULTURE W GRAM STAIN (SURGICAL/DEEP WOUND)

## 2023-01-22 LAB — HEMOGLOBIN A1C: Hgb A1c MFr Bld: 8.3 % — ABNORMAL HIGH (ref 4.6–6.5)

## 2023-01-22 NOTE — Progress Notes (Signed)
Office Visit Note   Patient: Mitchell [name]           Date of Birth: 1971-04-07           MRN: [id number] Visit Date: 01/08/2023              Requested by: Pearline Cables, MD [address] [place],  [place] [address] PCP: Pearline Cables, MD  Chief Complaint  Patient presents with   Left Foot - Follow-up      HPI: Patient is a 52 year old gentleman who is seen in follow-up for [facility] grade 1 ulcer left foot.  Assessment & Plan: Visit Diagnoses:  1. Foot ulcer, left, limited to breakdown of skin [facility])     Plan: Patient states he is continue to work on disability benefits.  Nails were trimmed x 5.  Note provided to return to work tomorrow.  Follow-Up Instructions: No follow-ups on file.   Ortho Exam  Patient is alert, oriented, no adenopathy, well-dressed, normal affect, normal respiratory effort. Examination patient is well consolidated right below-knee amputation with a well-fitting prosthesis.  Patient has an ulcer over the tip of the second toe PIP joint.  This has superficial epithelialization of the silicone pad was provided to unload pressure.  Nails were trimmed x 5.  Imaging: No results found. No images are attached to the encounter.  Labs: Lab Results  Component Value Date   HGBA1C 10.2 (H) 10/25/2022   HGBA1C 11.8 (H) 06/15/2022   HGBA1C 7.8 (A) 08/19/2021   ESRSEDRATE 20 (H) 03/08/2022   ESRSEDRATE 81 (H) 10/21/2019   ESRSEDRATE 25 (H) 06/17/2018   CRP 10.3 (H) 01/28/2021   CRP 12.8 (H) 01/27/2021   CRP 21.1 (H) 01/26/2021   LABURIC 10.9 (H) 10/23/2022   REPTSTATUS PENDING 01/20/2023   GRAMSTAIN NO WBC SEEN NO ORGANISMS SEEN  01/20/2023   CULT  01/20/2023    CULTURE REINCUBATED FOR BETTER GROWTH Performed at [facility], [address], [place], [place] [address]    LABORGA KLEBSIELLA PNEUMONIAE 05/21/2018   LABORGA ENTEROBACTER SPECIES 05/21/2018   LABORGA STAPHYLOCOCCUS AUREUS 05/21/2018     Lab Results   Component Value Date   ALBUMIN 4.4 11/08/2022   ALBUMIN 3.6 10/23/2022   ALBUMIN 4.2 10/21/2022   PREALBUMIN 6.8 (L) 10/21/2019    Lab Results  Component Value Date   MG 2.3 11/29/2021   MG 1.5 (L) 04/12/2021   MG 1.8 02/16/2021   Lab Results  Component Value Date   VD25OH 11 (L) 08/16/2009    Lab Results  Component Value Date   PREALBUMIN 6.8 (L) 10/21/2019      Latest Ref Rng & Units 11/21/2022    8:44 PM 11/20/2022   12:26 PM 11/14/2022   11:22 AM  CBC EXTENDED  WBC 4.0 - 10.5 K/uL 10.8  10.3  9.4   RBC 4.22 - 5.81 MIL/uL 4.21  4.36  4.23   Hemoglobin 13.0 - 17.0 g/dL 96.2  95.2  84.1   HCT 39.0 - 52.0 % 38.7  39.7  39.0   Platelets 150 - 400 K/uL 103  120  121   NEUT# 1.7 - 7.7 K/uL 8.5     Lymph# 0.7 - 4.0 K/uL 1.5        There is no height or weight on file to calculate BMI.  Orders:  No orders of the defined types were placed in this encounter.  No orders of the defined types were placed  in this encounter.    Procedures: No procedures performed  Clinical Data: No additional findings.  ROS:  All other systems negative, except as noted in the HPI. Review of Systems  Objective: Vital Signs: There were no vitals taken for this visit.  Specialty Comments:  No specialty comments available.  PMFS History: Patient Active Problem List   Diagnosis Date Noted   BPH with obstruction/lower urinary tract symptoms 11/21/2022   UTI (urinary tract infection) 10/23/2022   ICD (implantable cardioverter-defibrillator) in place 04/19/2022   Near syncope 12/08/2021   CKD (chronic kidney disease) stage 3, GFR 30-59 ml/min (HCC) 12/08/2021   PVD s/p R LE amputation  12/08/2021   Diabetic retinopathy associated with type 2 diabetes mellitus (HCC) 06/06/2021   CAD (coronary artery disease) with flat troponin  04/11/2021   S/P below knee amputation, right (HCC) 02/07/2021   AKI (acute kidney injury) (HCC) 01/23/2021   QT prolongation 07/30/2019   Acute on  chronic combined systolic and diastolic CHF (congestive heart failure) (HCC) 07/29/2019   Stroke (HCC) 05/08/2017   Uncontrolled type 2 diabetes mellitus with hyperglycemia, with long-term current use of insulin (HCC) 12/07/2015   Chronic systolic heart failure (HCC) 10/02/2011   Organic impotence 10/02/2011   Obstructive sleep apnea 10/11/2007   HLD (hyperlipidemia) 10/10/2007   HYPOKALEMIA 10/10/2007   Obesity, Class III, BMI 40-49.9 (morbid obesity) (HCC) 10/10/2007   Essential hypertension 10/10/2007   PREMATURE VENTRICULAR CONTRACTIONS 10/10/2007   COLONIC POLYPS, HX OF 10/10/2007   Past Medical History:  Diagnosis Date   Bipolar disorder (HCC)    CAD (coronary artery disease)    a. diffuse 3v CAD by cath 2019, medical therapy recommended.   Cataract    forming    Chronic systolic CHF (congestive heart failure) (HCC)    CVA (cerebral infarction)    No residual deficits   Depression    PTSD,    Diabetes mellitus    TYPE II; UNCONTROLLED BY HEMOGLOBIN A1c; STABLE AS  PER DISCHARGE   Headache(784.0)    Herpes simplex of male genitalia    History of colonic polyps    Hyperlipidemia    Hypertension    Myocardial infarction (HCC) 1987   (while playing football)   Obesity    OSA (obstructive sleep apnea)    repeat study 2018 without significant OSA   Pneumonia    Post-cardiac injury syndrome (HCC)    History of cardiac injury from blunt trauma   Pulmonary hypertension (HCC)    a. moderately elevated PASP 07/2019.   PVCs (premature ventricular contractions)    Schizophrenia (HCC)    Goes to [facility]   Sleep apnea    Stroke [facility]) 2005   some left side weakness   Syncope    Recurrent, thought to be vasovagal. Also has h/o frequent PVCs.     Family History  Problem Relation Age of Onset   Heart disease Mother        MI   Heart failure Mother    Diabetes Mother        ALSO IN MOST OF HIS SIBLINGS; 2 UNLCES HAVE ALSO PASSED AWAY FROM DM    Cardiomyopathy Mother    Cancer - Ovarian Mother    Ovarian cancer Mother    Heart disease Father    Hypertension Father    Diabetes Father    Diabetes Sister    Diabetes Brother    Colon cancer Paternal Uncle    Colon cancer Paternal Uncle  Colon polyps Neg Hx    Esophageal cancer Neg Hx    Rectal cancer Neg Hx    Stomach cancer Neg Hx     Past Surgical History:  Procedure Laterality Date   AMPUTATION Right 01/25/2021   Procedure: RIGHT BELOW KNEE AMPUTATION;  Surgeon: Nadara Mustard, MD;  Location: [facility] OR;  Service: Orthopedics;  Laterality: Right;   CARDIAC CATHETERIZATION  12/19/2010   DIFFUSE NONOBSTRUCTIVE CAD; NONISCHEMIC CARDIOMYOPATHY; LEFT VENTRICULAR ANGIOGRAM WAS PERFORMED SECONDARY TO  ELEVATED LEFT VENTRICULAR FILLING PRESSURES   COLONOSCOPY     ~ age [age]   COLONOSCOPY W/ POLYPECTOMY     I & D EXTREMITY Bilateral 10/24/2019   Procedure: IRRIGATION AND DEBRIDEMENT BILATERAL EXTREMITY WOUND ON FOOT;  Surgeon: Allena Napoleon, MD;  Location: [facility] OR;  Service: Plastics;  Laterality: Bilateral;   ICD IMPLANT N/A 01/13/2022   Procedure: ICD IMPLANT;  Surgeon: Marinus Maw, MD;  Location: [facility] INVASIVE CV LAB;  Service: Cardiovascular;  Laterality: N/A;   METATARSAL HEAD EXCISION Right 08/29/2018   Procedure: METATARSAL HEAD RESECTION;  Surgeon: Felecia Shelling, DPM;  Location: [facility] OR;  Service: Podiatry;  Laterality: Right;   METATARSAL OSTEOTOMY Right 08/29/2018   Procedure: SUB FIFTHE METATARSIA RIGHT FOOT;  Surgeon: Felecia Shelling, DPM;  Location: [facility] OR;  Service: Podiatry;  Laterality: Right;   MULTIPLE EXTRACTIONS WITH ALVEOLOPLASTY  01/27/2014   "all my teeth; 4 Quadrants of alveoloplasty   MULTIPLE EXTRACTIONS WITH ALVEOLOPLASTY N/A 01/27/2014   Procedure: EXTRACTION OF TEETH #'1, 2, 3, 4, 5, 6, 7, 8, 9, 10, 11, 12, 13, 14, 15, 16, 17, 20, 21, 22, 23, 24, 25, 26, 27, 28, 29, 31 and 32 WITH ALVEOLOPLASTY;  Surgeon: Charlynne Pander, DDS;  Location: [facility] OR;  Service:  Oral Surgery;  Laterality: N/A;   ORIF FINGER / THUMB FRACTURE Right    POLYPECTOMY     RIGHT HEART CATH N/A 01/27/2021   Procedure: RIGHT HEART CATH;  Surgeon: Dolores Patty, MD;  Location: [facility] INVASIVE CV LAB;  Service: Cardiovascular;  Laterality: N/A;   RIGHT/LEFT HEART CATH AND CORONARY ANGIOGRAPHY N/A 09/26/2017   Procedure: RIGHT/LEFT HEART CATH AND CORONARY ANGIOGRAPHY;  Surgeon: Dolores Patty, MD;  Location: [facility] INVASIVE CV LAB;  Service: Cardiovascular;  Laterality: N/A;   RIGHT/LEFT HEART CATH AND CORONARY ANGIOGRAPHY N/A 04/12/2021   Procedure: RIGHT/LEFT HEART CATH AND CORONARY ANGIOGRAPHY;  Surgeon: Dolores Patty, MD;  Location: [facility] INVASIVE CV LAB;  Service: Cardiovascular;  Laterality: N/A;   SKIN SPLIT GRAFT Bilateral 10/24/2019   Procedure: SKIN GRAFT SPLIT THICKNESS LEFT THIGH;  Surgeon: Allena Napoleon, MD;  Location: [facility] OR;  Service: Plastics;  Laterality: Bilateral;   WOUND DEBRIDEMENT Right 08/29/2018   Procedure: Debridement of ulcer on right fifth metatarsal;  Surgeon: Felecia Shelling, DPM;  Location: [facility] OR;  Service: Podiatry;  Laterality: Right;   Social History   Occupational History   Occupation: Mortician  Tobacco Use   Smoking status: Never   Smokeless tobacco: Never  Vaping Use   Vaping Use: Never used  Substance and Sexual Activity   Alcohol use: No   Drug use: No   Sexual activity: Not on file

## 2023-01-23 ENCOUNTER — Encounter: Payer: Self-pay | Admitting: Orthopedic Surgery

## 2023-01-23 LAB — AEROBIC/ANAEROBIC CULTURE W GRAM STAIN (SURGICAL/DEEP WOUND)

## 2023-01-23 NOTE — Progress Notes (Signed)
Office Visit Note   Patient: Mitchell [name]           Date of Birth: 1970-09-05           MRN: [id number] Visit Date: 01/22/2023              Requested by: Pearline Cables, MD [address] [place],  [place] [address] PCP: Pearline Cables, MD  Chief Complaint  Patient presents with   Right Leg - Pain    F/U Er visit 01/20/2023 blisters to BKA      HPI: Patient is a 52 year old gentleman who is status post a right below the knee amputation 2 years ago.  Patient states he went to the emergency room on June 22 with acute ulcerative blister over the residual limb.  Patient states the blister was aspirated in the emergency room.  Assessment & Plan: Visit Diagnoses:  1. S/P below knee amputation, right (HCC)   2. Ulcer of right lower extremity, limited to breakdown of skin [facility])     Plan: Patient will continue with routine wound care and follow-up with [facility] for a new socket and liner.  Follow-Up Instructions: Return in about 4 weeks (around 02/19/2023).   Ortho Exam  Patient is alert, oriented, no adenopathy, well-dressed, normal affect, normal respiratory effort. Examination the swelling has decreased there is good wrinkling of the skin there is no cellulitis.  The blistered of ulcers on the residual limb are secondary to subsiding into the socket with end bearing pressure.  These wounds are healing.  Recent hemoglobin A1c 8.3.  Patient is an existing right transtibial  amputee.  Patient's current comorbidities are not expected to impact the ability to function with the prescribed prosthesis. Patient verbally communicates a strong desire to use a prosthesis. Patient currently requires mobility aids to ambulate without a prosthesis.  Expects not to use mobility aids with a new prosthesis.  Patient is a K3 level ambulator that spends a lot of time walking around on uneven terrain over obstacles, up and down stairs, and ambulates with a variable  cadence.     Imaging: No results found. No images are attached to the encounter.  Labs: Lab Results  Component Value Date   HGBA1C 8.3 (H) 01/22/2023   HGBA1C 10.2 (H) 10/25/2022   HGBA1C 11.8 (H) 06/15/2022   ESRSEDRATE 20 (H) 03/08/2022   ESRSEDRATE 81 (H) 10/21/2019   ESRSEDRATE 25 (H) 06/17/2018   CRP 10.3 (H) 01/28/2021   CRP 12.8 (H) 01/27/2021   CRP 21.1 (H) 01/26/2021   LABURIC 10.9 (H) 10/23/2022   REPTSTATUS PENDING 01/20/2023   GRAMSTAIN  01/20/2023    NO WBC SEEN NO ORGANISMS SEEN Performed at [facility], [address], [place], [place] [address]    CULT  01/20/2023    RARE STAPHYLOCOCCUS CAPITIS SUSCEPTIBILITIES TO FOLLOW NO ANAEROBES ISOLATED; CULTURE IN PROGRESS FOR 5 DAYS    LABORGA KLEBSIELLA PNEUMONIAE 05/21/2018   LABORGA ENTEROBACTER SPECIES 05/21/2018   LABORGA STAPHYLOCOCCUS AUREUS 05/21/2018     Lab Results  Component Value Date   ALBUMIN 4.1 01/22/2023   ALBUMIN 4.4 11/08/2022   ALBUMIN 3.6 10/23/2022   PREALBUMIN 6.8 (L) 10/21/2019    Lab Results  Component Value Date   MG 2.3 11/29/2021   MG 1.5 (L) 04/12/2021   MG 1.8 02/16/2021   Lab Results  Component Value Date   VD25OH 11 (L) 08/16/2009    Lab Results  Component Value Date  PREALBUMIN 6.8 (L) 10/21/2019      Latest Ref Rng & Units 01/22/2023   10:58 AM 11/21/2022    8:44 PM 11/20/2022   12:26 PM  CBC EXTENDED  WBC 4.0 - 10.5 K/uL 8.1  10.8  10.3   RBC 4.22 - 5.81 Mil/uL 4.55  4.21  4.36   Hemoglobin 13.0 - 17.0 g/dL 16.1  09.6  04.5   HCT 39.0 - 52.0 % 41.9  38.7  39.7   Platelets 150.0 - 400.0 K/uL 149.0  103  120   NEUT# 1.7 - 7.7 K/uL  8.5    Lymph# 0.7 - 4.0 K/uL  1.5       There is no height or weight on file to calculate BMI.  Orders:  No orders of the defined types were placed in this encounter.  No orders of the defined types were placed in this encounter.    Procedures: No procedures performed  Clinical Data: No additional  findings.  ROS:  All other systems negative, except as noted in the HPI. Review of Systems  Objective: Vital Signs: There were no vitals taken for this visit.  Specialty Comments:  No specialty comments available.  PMFS History: Patient Active Problem List   Diagnosis Date Noted   BPH with obstruction/lower urinary tract symptoms 11/21/2022   UTI (urinary tract infection) 10/23/2022   ICD (implantable cardioverter-defibrillator) in place 04/19/2022   Near syncope 12/08/2021   CKD (chronic kidney disease) stage 3, GFR 30-59 ml/min (HCC) 12/08/2021   PVD s/p R LE amputation  12/08/2021   Diabetic retinopathy associated with type 2 diabetes mellitus (HCC) 06/06/2021   CAD (coronary artery disease) with flat troponin  04/11/2021   S/P below knee amputation, right (HCC) 02/07/2021   AKI (acute kidney injury) (HCC) 01/23/2021   QT prolongation 07/30/2019   Acute on chronic combined systolic and diastolic CHF (congestive heart failure) (HCC) 07/29/2019   Stroke (HCC) 05/08/2017   Uncontrolled type 2 diabetes mellitus with hyperglycemia, with long-term current use of insulin (HCC) 12/07/2015   Chronic systolic heart failure (HCC) 10/02/2011   Organic impotence 10/02/2011   Obstructive sleep apnea 10/11/2007   HLD (hyperlipidemia) 10/10/2007   HYPOKALEMIA 10/10/2007   Obesity, Class III, BMI 40-49.9 (morbid obesity) (HCC) 10/10/2007   Essential hypertension 10/10/2007   PREMATURE VENTRICULAR CONTRACTIONS 10/10/2007   COLONIC POLYPS, HX OF 10/10/2007   Past Medical History:  Diagnosis Date   Bipolar disorder (HCC)    CAD (coronary artery disease)    a. diffuse 3v CAD by cath 2019, medical therapy recommended.   Cataract    forming    Chronic systolic CHF (congestive heart failure) (HCC)    CVA (cerebral infarction)    No residual deficits   Depression    PTSD,    Diabetes mellitus    TYPE II; UNCONTROLLED BY HEMOGLOBIN A1c; STABLE AS  PER DISCHARGE   Headache(784.0)     Herpes simplex of male genitalia    History of colonic polyps    Hyperlipidemia    Hypertension    Myocardial infarction (HCC) 1987   (while playing football)   Obesity    OSA (obstructive sleep apnea)    repeat study 2018 without significant OSA   Pneumonia    Post-cardiac injury syndrome (HCC)    History of cardiac injury from blunt trauma   Pulmonary hypertension (HCC)    a. moderately elevated PASP 07/2019.   PVCs (premature ventricular contractions)    Schizophrenia (HCC)    [facility]es  to [facility]   Sleep apnea    Stroke [facility]) 2005   some left side weakness   Syncope    Recurrent, thought to be vasovagal. Also has h/o frequent PVCs.     Family History  Problem Relation Age of Onset   Heart disease Mother        MI   Heart failure Mother    Diabetes Mother        ALSO IN MOST OF HIS SIBLINGS; 2 UNLCES HAVE ALSO PASSED AWAY FROM DM   Cardiomyopathy Mother    Cancer - Ovarian Mother    Ovarian cancer Mother    Heart disease Father    Hypertension Father    Diabetes Father    Diabetes Sister    Diabetes Brother    Colon cancer Paternal Uncle    Colon cancer Paternal Uncle    Colon polyps Neg Hx    Esophageal cancer Neg Hx    Rectal cancer Neg Hx    Stomach cancer Neg Hx     Past Surgical History:  Procedure Laterality Date   AMPUTATION Right 01/25/2021   Procedure: RIGHT BELOW KNEE AMPUTATION;  Surgeon: Nadara Mustard, MD;  Location: [facility] OR;  Service: Orthopedics;  Laterality: Right;   CARDIAC CATHETERIZATION  12/19/2010   DIFFUSE NONOBSTRUCTIVE CAD; NONISCHEMIC CARDIOMYOPATHY; LEFT VENTRICULAR ANGIOGRAM WAS PERFORMED SECONDARY TO  ELEVATED LEFT VENTRICULAR FILLING PRESSURES   COLONOSCOPY     ~ age [age]   COLONOSCOPY W/ POLYPECTOMY     I & D EXTREMITY Bilateral 10/24/2019   Procedure: IRRIGATION AND DEBRIDEMENT BILATERAL EXTREMITY WOUND ON FOOT;  Surgeon: Allena Napoleon, MD;  Location: [facility] OR;  Service: Plastics;  Laterality: Bilateral;   ICD  IMPLANT N/A 01/13/2022   Procedure: ICD IMPLANT;  Surgeon: Marinus Maw, MD;  Location: [facility] INVASIVE CV LAB;  Service: Cardiovascular;  Laterality: N/A;   METATARSAL HEAD EXCISION Right 08/29/2018   Procedure: METATARSAL HEAD RESECTION;  Surgeon: Felecia Shelling, DPM;  Location: [facility] OR;  Service: Podiatry;  Laterality: Right;   METATARSAL OSTEOTOMY Right 08/29/2018   Procedure: SUB FIFTHE METATARSIA RIGHT FOOT;  Surgeon: Felecia Shelling, DPM;  Location: [facility] OR;  Service: Podiatry;  Laterality: Right;   MULTIPLE EXTRACTIONS WITH ALVEOLOPLASTY  01/27/2014   "all my teeth; 4 Quadrants of alveoloplasty   MULTIPLE EXTRACTIONS WITH ALVEOLOPLASTY N/A 01/27/2014   Procedure: EXTRACTION OF TEETH #'1, 2, 3, 4, 5, 6, 7, 8, 9, 10, 11, 12, 13, 14, 15, 16, 17, 20, 21, 22, 23, 24, 25, 26, 27, 28, 29, 31 and 32 WITH ALVEOLOPLASTY;  Surgeon: Charlynne Pander, DDS;  Location: [facility] OR;  Service: Oral Surgery;  Laterality: N/A;   ORIF FINGER / THUMB FRACTURE Right    POLYPECTOMY     RIGHT HEART CATH N/A 01/27/2021   Procedure: RIGHT HEART CATH;  Surgeon: Dolores Patty, MD;  Location: [facility] INVASIVE CV LAB;  Service: Cardiovascular;  Laterality: N/A;   RIGHT/LEFT HEART CATH AND CORONARY ANGIOGRAPHY N/A 09/26/2017   Procedure: RIGHT/LEFT HEART CATH AND CORONARY ANGIOGRAPHY;  Surgeon: Dolores Patty, MD;  Location: [facility] INVASIVE CV LAB;  Service: Cardiovascular;  Laterality: N/A;   RIGHT/LEFT HEART CATH AND CORONARY ANGIOGRAPHY N/A 04/12/2021   Procedure: RIGHT/LEFT HEART CATH AND CORONARY ANGIOGRAPHY;  Surgeon: Dolores Patty, MD;  Location: [facility] INVAS[facility] CV LAB;  Service: Cardiovascular;  Laterality: N/A;   SKIN SPLIT GRAFT Bilateral 10/24/2019   Procedure: SKIN GRAFT SPLIT THICKNESS LEFT THIGH;  Surgeon: Allena Napoleon, MD;  Location: [facility] OR;  Service: Plastics;  Laterality: Bilateral;   WOUND DEBRIDEMENT Right 08/29/2018   Procedure: Debridement of ulcer on right fifth metatarsal;  Surgeon: Felecia Shelling,  DPM;  Location: [facility] OR;  Service: Podiatry;  Laterality: Right;   Social History   Occupational History   Occupation: Mortician  Tobacco Use   Smoking status: Never   Smokeless tobacco: Never  Vaping Use   Vaping Use: Never used  Substance and Sexual Activity   Alcohol use: No   Drug use: No   Sexual activity: Not on file

## 2023-01-24 LAB — AEROBIC/ANAEROBIC CULTURE W GRAM STAIN (SURGICAL/DEEP WOUND)

## 2023-01-25 LAB — AEROBIC/ANAEROBIC CULTURE W GRAM STAIN (SURGICAL/DEEP WOUND)

## 2023-01-26 ENCOUNTER — Telehealth (HOSPITAL_BASED_OUTPATIENT_CLINIC_OR_DEPARTMENT_OTHER): Payer: Self-pay | Admitting: Emergency Medicine

## 2023-01-26 NOTE — Telephone Encounter (Signed)
Post ED Visit - Positive Culture Follow-up  Culture report reviewed by antimicrobial stewardship pharmacist: [facility] []  Enzo Bi, Pharm.D. []  Celedonio Miyamoto, Pharm.D., BCPS AQ-ID []  Garvin Fila, Pharm.D., BCPS []  Georgina Pillion, Pharm.D., BCPS []  [place], [address].D., BCPS, AAHIVP []  Estella Husk, Pharm.D., BCPS, AAHIVP []  Lysle Pearl, PharmD, BCPS []  Phillips Climes, PharmD, BCPS []  Agapito Games, PharmD, BCPS [x]  Calton Dach, PharmD []  Mervyn Gay, PharmD, BCPS []  Vinnie Level, PharmD  [facility] []  Len Childs, PharmD []  Greer Pickerel, PharmD []  Adalberto Cole, PharmD []  Perlie Gold, Rph []  Lonell Face) Jean Rosenthal, PharmD []  Earl Many, PharmD []  Junita Push, PharmD []  Dorna Leitz, PharmD []  Terrilee Files, PharmD []  Lynann Beaver, PharmD []  Keturah Barre, PharmD []  Loralee Pacas, PharmD []  Bernadene Person, PharmD   Positive aerobic culture Treated with Cephalexin, organism sensitive to the same and no further patient follow-up is required at this time.  Glyn Ade MD  Pamala Hurry 01/26/2023, 5:44 PM

## 2023-02-02 NOTE — Progress Notes (Signed)
Remote ICD transmission.   

## 2023-02-06 ENCOUNTER — Encounter (HOSPITAL_BASED_OUTPATIENT_CLINIC_OR_DEPARTMENT_OTHER): Payer: Self-pay

## 2023-02-06 ENCOUNTER — Telehealth: Payer: Self-pay | Admitting: Family Medicine

## 2023-02-06 ENCOUNTER — Emergency Department (HOSPITAL_BASED_OUTPATIENT_CLINIC_OR_DEPARTMENT_OTHER)
Admission: EM | Admit: 2023-02-06 | Discharge: 2023-02-07 | Disposition: A | Discharge status: Home / Self Care | Payer: No Typology Code available for payment source | Attending: Emergency Medicine | Admitting: Emergency Medicine

## 2023-02-06 ENCOUNTER — Emergency Department (HOSPITAL_BASED_OUTPATIENT_CLINIC_OR_DEPARTMENT_OTHER): Payer: No Typology Code available for payment source

## 2023-02-06 ENCOUNTER — Other Ambulatory Visit: Payer: Self-pay

## 2023-02-06 DIAGNOSIS — Z794 Long term (current) use of insulin: Secondary | ICD-10-CM | POA: Diagnosis not present

## 2023-02-06 DIAGNOSIS — S90425A Blister (nonthermal), left lesser toe(s), initial encounter: Secondary | ICD-10-CM | POA: Diagnosis not present

## 2023-02-06 DIAGNOSIS — X58XXXA Exposure to other specified factors, initial encounter: Secondary | ICD-10-CM | POA: Insufficient documentation

## 2023-02-06 DIAGNOSIS — L84 Corns and callosities: Secondary | ICD-10-CM | POA: Diagnosis not present

## 2023-02-06 DIAGNOSIS — Z7902 Long term (current) use of antithrombotics/antiplatelets: Secondary | ICD-10-CM | POA: Insufficient documentation

## 2023-02-06 DIAGNOSIS — E119 Type 2 diabetes mellitus without complications: Secondary | ICD-10-CM | POA: Diagnosis not present

## 2023-02-06 DIAGNOSIS — S99922A Unspecified injury of left foot, initial encounter: Secondary | ICD-10-CM | POA: Diagnosis present

## 2023-02-06 DIAGNOSIS — Z7982 Long term (current) use of aspirin: Secondary | ICD-10-CM | POA: Insufficient documentation

## 2023-02-06 DIAGNOSIS — T148XXA Other injury of unspecified body region, initial encounter: Secondary | ICD-10-CM

## 2023-02-06 NOTE — ED Triage Notes (Signed)
Pt reports pain to the second toe on his left foot that he noticed tonight. He has a right BKA. Hx of diabetes. There appears to be what looks like a callous on the tip of that toe.

## 2023-02-06 NOTE — Telephone Encounter (Signed)
Pt dropped of Disability license plate form. Pt asks to be called at number on form when is complete. Form placed in PCP's tray in front office.

## 2023-02-07 MED ORDER — DOXYCYCLINE HYCLATE 100 MG PO CAPS: 100.0000 mg | ORAL_CAPSULE | Freq: Two times a day (BID) | ORAL | 0 refills | Status: DC

## 2023-02-07 NOTE — ED Provider Notes (Signed)
[place] [facility] Provider Note   CSN: [id number] Arrival date & time: 02/06/23  2316     History  Chief Complaint  Patient presents with   Toe Pain    Mitchell [name] is a 52 y.o. male.  52 year old male with history of diabetes and a right AKA secondary to infection who presents ER today with left second toe pain.  Patient states he literally have a lot of pain there necessarily but has a callus and wants to get checked out so it is not infection.  No drainage.  Wear shoes that are 1 size too big so his orthotics with hitting them.  No known trauma.  No fevers.   Toe Pain       Home Medications Prior to Admission medications   Medication Sig Start Date End Date Taking? Authorizing Provider  doxycycline (VIBRAMYCIN) 100 MG capsule Take 1 capsule (100 mg total) by mouth 2 (two) times daily. One po bid x 7 days 02/07/23  Yes Melony Tenpas, Barbara Cower, MD  Accu-Chek Softclix Lancets lancets Use to test blood sugars up to 4 times daily as needed. 12/08/21   Orland Mustard, MD  acetaminophen-codeine (TYLENOL #3) 300-30 MG tablet Take 1 tablet by mouth every 6 (six) hours as needed for moderate pain. 06/15/22   [name], Gwenlyn Found, MD  amitriptyline (ELAVIL) 10 MG tablet Take 1 tablet (10 mg total) by mouth at bedtime. 07/26/22   [name], Gwenlyn Found, MD  Ascorbic Acid (VITAMIN C PO) Take 1 tablet by mouth daily.    [provider]  aspirin 81 MG EC tablet Take 1 tablet (81 mg total) by mouth daily. 04/13/21   Danford, Earl Lites, MD  blood glucose meter kit and supplies Dispense based on patient and insurance preference. Use up to four times daily as directed. (FOR ICD-10 E10.9, E11.9). 06/15/22   [name], Gwenlyn Found, MD  Blood Glucose Monitoring Suppl (BLOOD GLUCOSE MONITOR SYSTEM) w/Device KIT Use to test blood sugar up to 4 times daily as needed. 12/08/21   Arby Barrette, MD  carvedilol (COREG) 6.25 MG tablet Take 1 tablet (6.25 mg total) by mouth  2 (two) times daily with a meal. 10/30/22   Bensimhon, [name], MD  cephALEXin (KEFLEX) 500 MG capsule Take 1 capsule (500 mg total) by mouth 4 (four) times daily. 01/20/23   Molpus, John, MD  Cholecalciferol (VITAMIN D-3 PO) Take 1 capsule by mouth daily.    [provider]  clopidogrel (PLAVIX) 75 MG tablet Take 1 tablet (75 mg total) by mouth daily. 07/26/22   Bensimhon, [name], MD  gabapentin (NEURONTIN) 100 MG capsule Take 1 capsule (100 mg total) by mouth 2 (two) times daily. 07/26/22   [name], Gwenlyn Found, MD  glipiZIDE (GLUCOTROL XL) 5 MG 24 hr tablet Take 1 tablet (5 mg total) by mouth daily with breakfast. Take with first meal of the day 11/10/22   [name], Gwenlyn Found, MD  glucose blood (ACCU-CHEK GUIDE) test strip Use to test blood sugars up to 4 times daily as needed. 12/08/21   Orland Mustard, MD  Insulin Glargine [facility]) 100 UNIT/ML Inject 42 Units into the skin daily. 09/29/22   [name], Gwenlyn Found, MD  methocarbamol (ROBAXIN) 500 MG tablet TAKE 1 TABLET BY MOUTH EVERY 6 HOURS AS NEEDED FOR MUSCLE SPASM 12/11/22   [name], Gwenlyn Found, MD  pantoprazole (PROTONIX) 40 MG tablet Take 1 tablet (40 mg total) by mouth daily as needed (acid reflux). 02/13/22  [name], Gwenlyn Found, MD  potassium chloride SA (KLOR-CON M) 20 MEQ tablet Take 1 tablet (20 mEq total) by mouth daily. 05/05/22   Milford, Anderson Malta, FNP  rosuvastatin (CRESTOR) 40 MG tablet Take 1 tablet (40 mg total) by mouth daily. 07/26/22   Bensimhon, [name], MD  sacubitril-valsartan (ENTRESTO) 49-51 MG Take 1 tablet by mouth 2 (two) times daily. 12/20/22   [name], Gwenlyn Found, MD  spironolactone (ALDACTONE) 25 MG tablet Take 0.5 tablets (12.5 mg total) by mouth daily. 10/27/22   Albertine Grates, MD  tadalafil (CIALIS) 20 MG tablet Take 1 tablet (20 mg total) by mouth daily as needed for erectile dysfunction. 11/21/22   Stoneking, Danford Bad., MD  tamsulosin (FLOMAX) 0.4 MG CAPS capsule Take 1 capsule (0.4 mg total) by mouth daily  after supper. 01/02/23   Stoneking, Danford Bad., MD  torsemide (DEMADEX) 20 MG tablet Take 1 tablet (20 mg total) by mouth daily. 10/28/22   Albertine Grates, MD      Allergies    Marcelline Deist [dapagliflozin], Nsaids, and Trulicity [dulaglutide]    Review of Systems   Review of Systems  Physical Exam Updated Vital Signs BP 115/67   Pulse 78   Temp 98.4 F (36.9 C) (Oral)   Resp 18   Ht 5\' 11"  (1.803 m)   Wt 132 kg   SpO2 100%   BMI 40.59 kg/m  Physical Exam Vitals and nursing note reviewed.  Constitutional:      Appearance: He is well-developed.  HENT:     Head: Normocephalic and atraumatic.  Eyes:     Pupils: Pupils are equal, round, and reactive to light.  Cardiovascular:     Rate and Rhythm: Normal rate.  Pulmonary:     Effort: Pulmonary effort is normal. No respiratory distress.  Abdominal:     General: There is no distension.  Musculoskeletal:        General: Normal range of motion.     Cervical back: Normal range of motion.  Skin:    General: Skin is warm and dry.     Comments: Patient has a small callus on the distal end of his left second toe.  Has a small blood blister underneath it.  No erythema, drainage, warmth.  No deformity.  Neurological:     General: No focal deficit present.     Mental Status: He is alert.     ED Results / Procedures / Treatments   Labs (all labs ordered are listed, but only abnormal results are displayed) Labs Reviewed - No data to display  EKG None  Radiology DG Foot Complete Left  Result Date: 02/07/2023 CLINICAL DATA:  Evaluate for injury EXAM: LEFT FOOT - COMPLETE 3+ VIEW COMPARISON:  None Available. FINDINGS: There is soft tissue swelling of the distal lateral foot and fifth toe. There is no acute fracture or dislocation identified. There are mild degenerative changes of the first metatarsophalangeal joint. There is degenerative spurring of the midfoot. Plantar and posterior calcaneal spurs are present. IMPRESSION: Soft tissue swelling  of the distal lateral foot and fifth toe. No acute fracture or dislocation identified. Electronically Signed   By: Darliss Cheney M.D.   On: 02/07/2023 00:11    Procedures Procedures    Medications Ordered in ED Medications - No data to display  ED Course/ Medical Decision Making/ A&P  Medical Decision Making Amount and/or Complexity of Data Reviewed Radiology: ordered.  Risk Prescription drug management.   No evidence of infection.  I think is just from well-fitting shoes.  He has a podiatrist and suggested he follow-up with him for better shoe fitting and better footcare.  Did give a prescription for doxycycline as he does have diabetes and history of limb threatening infection so we will start that if he has any drainage, fever, redness or worsening pain.  Final Clinical Impression(s) / ED Diagnoses Final diagnoses:  Callus  Blood blister    Rx / DC Orders ED Discharge Orders          Ordered    doxycycline (VIBRAMYCIN) 100 MG capsule  2 times daily        02/07/23 0028              Toshia Larkin, Barbara Cower, MD 02/07/23 [id number]

## 2023-02-07 NOTE — Telephone Encounter (Signed)
Message sent to Carla's MyChart letting them both know that Dr Patsy Lager is on vacay- but will sing on her return.

## 2023-02-08 DIAGNOSIS — Z0279 Encounter for issue of other medical certificate: Secondary | ICD-10-CM

## 2023-02-08 NOTE — Telephone Encounter (Signed)
Forms have been placed up front for pick up.   

## 2023-02-08 NOTE — Telephone Encounter (Signed)
Caller Name Alfonzia Woolum Caller Phone Number [phone] Patient Name Mitchell [name] Patient DOB [birth year] Call Type Message Only Information Provided Reason for Call Request for General Office Information Initial Comment Caller states his daughter brought 2 handicap forms for the Dr. to sign, but he has not received them back yet. Disp. Time Disposition Final User 02/08/2023 12:31:36 PM General Information Provided Yes Hughley, Clemmie Call Closed By: Mayra Neer Transaction Date/Time: 02/08/2023 12:26:31 PM (ET)

## 2023-02-09 ENCOUNTER — Other Ambulatory Visit: Payer: Self-pay

## 2023-02-09 ENCOUNTER — Encounter (HOSPITAL_BASED_OUTPATIENT_CLINIC_OR_DEPARTMENT_OTHER): Payer: Self-pay

## 2023-02-09 ENCOUNTER — Emergency Department (HOSPITAL_BASED_OUTPATIENT_CLINIC_OR_DEPARTMENT_OTHER)
Admission: EM | Admit: 2023-02-09 | Discharge: 2023-02-10 | Disposition: A | Discharge status: Home / Self Care | Payer: No Typology Code available for payment source | Attending: Emergency Medicine | Admitting: Emergency Medicine

## 2023-02-09 DIAGNOSIS — E119 Type 2 diabetes mellitus without complications: Secondary | ICD-10-CM | POA: Diagnosis not present

## 2023-02-09 DIAGNOSIS — I2510 Atherosclerotic heart disease of native coronary artery without angina pectoris: Secondary | ICD-10-CM | POA: Diagnosis not present

## 2023-02-09 DIAGNOSIS — Z79899 Other long term (current) drug therapy: Secondary | ICD-10-CM | POA: Insufficient documentation

## 2023-02-09 DIAGNOSIS — Z7902 Long term (current) use of antithrombotics/antiplatelets: Secondary | ICD-10-CM | POA: Diagnosis not present

## 2023-02-09 DIAGNOSIS — Z7984 Long term (current) use of oral hypoglycemic drugs: Secondary | ICD-10-CM | POA: Diagnosis not present

## 2023-02-09 DIAGNOSIS — L84 Corns and callosities: Secondary | ICD-10-CM | POA: Insufficient documentation

## 2023-02-09 DIAGNOSIS — Z8673 Personal history of transient ischemic attack (TIA), and cerebral infarction without residual deficits: Secondary | ICD-10-CM | POA: Diagnosis not present

## 2023-02-09 DIAGNOSIS — M79675 Pain in left toe(s): Secondary | ICD-10-CM | POA: Diagnosis present

## 2023-02-09 DIAGNOSIS — I5022 Chronic systolic (congestive) heart failure: Secondary | ICD-10-CM | POA: Diagnosis not present

## 2023-02-09 DIAGNOSIS — Z794 Long term (current) use of insulin: Secondary | ICD-10-CM | POA: Insufficient documentation

## 2023-02-09 DIAGNOSIS — I110 Hypertensive heart disease with heart failure: Secondary | ICD-10-CM | POA: Diagnosis not present

## 2023-02-09 DIAGNOSIS — Z7982 Long term (current) use of aspirin: Secondary | ICD-10-CM | POA: Insufficient documentation

## 2023-02-09 NOTE — Discharge Instructions (Signed)
You were seen today for recheck of a callus on her toe.  It does not appear infected.  You should follow-up with Dr. Lajoyce Corners or podiatry if you wish to have the callus trimmed.  Make sure that you are wearing well-fitting shoes.

## 2023-02-09 NOTE — ED Triage Notes (Signed)
Patient reports second toe has callus, was told to follow up with PMD, no appointments available, wants to be evaluated by a doctor again.

## 2023-02-09 NOTE — ED Provider Notes (Signed)
[place] EMERGENCY DEPARTMENT AT [facility] Provider Note   CSN: [id number] Arrival date & time: 02/09/23  2320     History  Chief Complaint  Patient presents with   Toe Pain    Mitchell [name] is a 52 y.o. male.  HPI     This is a 52 year old male with a history of right BKA and multiple diabetic foot infections who presents with concern for callus to the left second toe.  He was seen and evaluated 7/10 for the same.  He states that he has been unable to see his podiatrist or his primary physician and wants his calluses trimmed.  He states that previously Dr. Lajoyce Corners has done this for him but he was unable to get in with him.  He has not noted any redness or drainage.  No fevers or systemic symptoms.  Home Medications Prior to Admission medications   Medication Sig Start Date End Date Taking? Authorizing Provider  Accu-Chek Softclix Lancets lancets Use to test blood sugars up to 4 times daily as needed. 12/08/21   Orland Mustard, MD  acetaminophen-codeine (TYLENOL #3) 300-30 MG tablet Take 1 tablet by mouth every 6 (six) hours as needed for moderate pain. 06/15/22   [name], Gwenlyn Found, MD  amitriptyline (ELAVIL) 10 MG tablet Take 1 tablet (10 mg total) by mouth at bedtime. 07/26/22   [name], Gwenlyn Found, MD  Ascorbic Acid (VITAMIN C PO) Take 1 tablet by mouth daily.    [provider]  aspirin 81 MG EC tablet Take 1 tablet (81 mg total) by mouth daily. 04/13/21   Danford, Earl Lites, MD  blood glucose meter kit and supplies Dispense based on patient and insurance preference. Use up to four times daily as directed. (FOR ICD-10 E10.9, E11.9). 06/15/22   [name], Gwenlyn Found, MD  Blood Glucose Monitoring Suppl (BLOOD GLUCOSE MONITOR SYSTEM) w/Device KIT Use to test blood sugar up to 4 times daily as needed. 12/08/21   Arby Barrette, MD  carvedilol (COREG) 6.25 MG tablet Take 1 tablet (6.25 mg total) by mouth 2 (two) times daily with a meal. 10/30/22   Bensimhon, [name], MD  cephALEXin (KEFLEX) 500 MG capsule Take 1 capsule (500 mg total) by mouth 4 (four) times daily. 01/20/23   Molpus, John, MD  Cholecalciferol (VITAMIN D-3 PO) Take 1 capsule by mouth daily.    [provider]  clopidogrel (PLAVIX) 75 MG tablet Take 1 tablet (75 mg total) by mouth daily. 07/26/22   Bensimhon, [name], MD  doxycycline (VIBRAMYCIN) 100 MG capsule Take 1 capsule (100 mg total) by mouth 2 (two) times daily. One po bid x 7 days 02/07/23   Mesner, Barbara Cower, MD  gabapentin (NEURONTIN) 100 MG capsule Take 1 capsule (100 mg total) by mouth 2 (two) times daily. 07/26/22   [name], Gwenlyn Found, MD  glipiZIDE (GLUCOTROL XL) 5 MG 24 hr tablet Take 1 tablet (5 mg total) by mouth daily with breakfast. Take with first meal of the day 11/10/22   [name], Gwenlyn Found, MD  glucose blood (ACCU-CHEK GUIDE) test strip Use to test blood sugars up to 4 times daily as needed. 12/08/21   Orland Mustard, MD  Insulin Glargine [facility]) 100 UNIT/ML Inject 42 Units into the skin daily. 09/29/22   [name], Gwenlyn Found, MD  methocarbamol (ROBAXIN) 500 MG tablet TAKE 1 TABLET BY MOUTH EVERY 6 HOURS AS NEEDED FOR MUSCLE SPASM 12/11/22   [name], Gwenlyn Found, MD  pantoprazole (PROTONIX) 40 MG tablet  Take 1 tablet (40 mg total) by mouth daily as needed (acid reflux). 02/13/22   [name], Gwenlyn Found, MD  potassium chloride SA (KLOR-CON M) 20 MEQ tablet Take 1 tablet (20 mEq total) by mouth daily. 05/05/22   Milford, Anderson Malta, FNP  rosuvastatin (CRESTOR) 40 MG tablet Take 1 tablet (40 mg total) by mouth daily. 07/26/22   Bensimhon, [name], MD  sacubitril-valsartan (ENTRESTO) 49-51 MG Take 1 tablet by mouth 2 (two) times daily. 12/20/22   [name], Gwenlyn Found, MD  spironolactone (ALDACTONE) 25 MG tablet Take 0.5 tablets (12.5 mg total) by mouth daily. 10/27/22   Albertine Grates, MD  tadalafil (CIALIS) 20 MG tablet Take 1 tablet (20 mg total) by mouth daily as needed for erectile dysfunction. 11/21/22   Stoneking, Danford Bad., MD   tamsulosin (FLOMAX) 0.4 MG CAPS capsule Take 1 capsule (0.4 mg total) by mouth daily after supper. 01/02/23   Stoneking, Danford Bad., MD  torsemide (DEMADEX) 20 MG tablet Take 1 tablet (20 mg total) by mouth daily. 10/28/22   Albertine Grates, MD      Allergies    Marcelline Deist [dapagliflozin], Nsaids, and Trulicity [dulaglutide]    Review of Systems   Review of Systems  Skin:  Negative for color change and wound.  All other systems reviewed and are negative.   Physical Exam Updated Vital Signs BP (!) 149/82   Pulse 81   Temp 98.4 F (36.9 C) (Oral)   Resp 18   Ht 1.803 m (5\' 11" )   Wt 132 kg   SpO2 100%   BMI 40.59 kg/m  Physical Exam Vitals and nursing note reviewed.  Constitutional:      Appearance: He is well-developed. He is obese. He is not ill-appearing.  HENT:     Head: Normocephalic and atraumatic.  Eyes:     Pupils: Pupils are equal, round, and reactive to light.  Cardiovascular:     Rate and Rhythm: Normal rate and regular rhythm.     Heart sounds: Normal heart sounds.  Pulmonary:     Effort: Pulmonary effort is normal. No respiratory distress.     Breath sounds: Normal breath sounds.  Musculoskeletal:     Cervical back: Neck supple.     Comments: Focused examination of the left foot with thick callus noted at the tip of the second toe, no adjacent erythema, no drainage, no ulceration Right BKA  Lymphadenopathy:     Cervical: No cervical adenopathy.  Skin:    General: Skin is warm and dry.  Neurological:     Mental Status: He is alert and oriented to person, place, and time.  Psychiatric:        Mood and Affect: Mood normal.     ED Results / Procedures / Treatments   Labs (all labs ordered are listed, but only abnormal results are displayed) Labs Reviewed - No data to display  EKG None  Radiology No results found.  Procedures Procedures    Medications Ordered in ED Medications - No data to display  ED Course/ Medical Decision Making/ A&P                              Medical Decision Making  This patient presents to the ED for concern of toe callus, this involves an extensive number of treatment options, and is a complaint that carries with it a high risk of complications and morbidity.  I considered the following differential and  admission for this acute, potentially life threatening condition.  The differential diagnosis includes callus, infection  MDM:    This is a 52 year old male who presents with ongoing callus of the left second toe.  Seen and evaluated 2 days ago for the same.  Callus does not appear infected.  He had x-rays 2 days ago that did not show any evidence of osteomyelitis.  He is requesting trimming of this callus.  Given his history of multiple infections and a BKA secondary to infections, he is high risk.  Discussed with him that this is best done by Dr. Lajoyce Corners or podiatrist.  He was encouraged to make sure to wear well-fitting shoes.  (Labs, imaging, consults)  Labs: I Ordered, and personally interpreted labs.  The pertinent results include: None  Imaging Studies ordered: I ordered imaging studies including none I independently visualized and interpreted imaging. I agree with the radiologist interpretation  Additional history obtained from chart review.  External records from outside source obtained and reviewed including prior evaluations  Cardiac Monitoring: The patient was not maintained on a cardiac monitor.  If on the cardiac monitor, I personally viewed and interpreted the cardiac monitored which showed an underlying rhythm of: N/A  Reevaluation: After the interventions noted above, I reevaluated the patient and found that they have :stayed the same  Social Determinants of Health:  lives independently  Disposition: Discharge  Co morbidities that complicate the patient evaluation  Past Medical History:  Diagnosis Date   Bipolar disorder (HCC)    CAD (coronary artery disease)    a. diffuse 3v CAD by  cath 2019, medical therapy recommended.   Cataract    forming    Chronic systolic CHF (congestive heart failure) (HCC)    CVA (cerebral infarction)    No residual deficits   Depression    PTSD,    Diabetes mellitus    TYPE II; UNCONTROLLED BY HEMOGLOBIN A1c; STABLE AS  PER DISCHARGE   Headache(784.0)    Herpes simplex of male genitalia    History of colonic polyps    Hyperlipidemia    Hypertension    Myocardial infarction (HCC) 1987   (while playing football)   Obesity    OSA (obstructive sleep apnea)    repeat study 2018 without significant OSA   Pneumonia    Post-cardiac injury syndrome (HCC)    History of cardiac injury from blunt trauma   Pulmonary hypertension (HCC)    a. moderately elevated PASP 07/2019.   PVCs (premature ventricular contractions)    Schizophrenia (HCC)    Goes to [facility]   Sleep apnea    Stroke [facility]) 2005   some left side weakness   Syncope    Recurrent, thought to be vasovagal. Also has h/o frequent PVCs.      Medicines No orders of the defined types were placed in this encounter.   I have reviewed the patients home medicines and have made adjustments as needed  Problem List / ED Course: Problem List Items Addressed This Visit   None Visit Diagnoses     Callus of toe    -  Primary                   Final Clinical Impression(s) / ED Diagnoses Final diagnoses:  Callus of toe    Rx / DC Orders ED Discharge Orders     None         Shon Baton, MD 02/09/23 2359

## 2023-02-12 ENCOUNTER — Other Ambulatory Visit (HOSPITAL_COMMUNITY): Payer: Self-pay | Admitting: Internal Medicine

## 2023-02-12 ENCOUNTER — Ambulatory Visit (INDEPENDENT_AMBULATORY_CARE_PROVIDER_SITE_OTHER): Payer: No Typology Code available for payment source | Admitting: Orthopedic Surgery

## 2023-02-12 DIAGNOSIS — L97521 Non-pressure chronic ulcer of other part of left foot limited to breakdown of skin: Secondary | ICD-10-CM

## 2023-02-12 DIAGNOSIS — Z89511 Acquired absence of right leg below knee: Secondary | ICD-10-CM | POA: Diagnosis not present

## 2023-02-13 ENCOUNTER — Encounter: Payer: Self-pay | Admitting: Orthopedic Surgery

## 2023-02-13 NOTE — Progress Notes (Signed)
Office Visit Note   Patient: Mitchell [name]           Date of Birth: 1970-10-05           MRN: [id number] Visit Date: 02/12/2023              Requested by: Pearline Cables, MD [address] [place],  [place] [address] PCP: Pearline Cables, MD  Chief Complaint  Patient presents with   Left Foot - Follow-up      HPI: Patient is a 52 year old gentleman who is seen for evaluation ulceration left foot second toe.  Patient states he has been to several emergency rooms over the weekend to have this debrided and recommendation was to follow-up in the office.  Assessment & Plan: Visit Diagnoses:  1. S/P below knee amputation, right (HCC)   2. Foot ulcer, left, limited to breakdown of skin (HCC)     Plan: Ulcer debrided no signs of infection.  No ulcers on the right transtibial amputation.  Nails trimmed x 5.  Follow-Up Instructions: Return in about 2 months (around 04/15/2023).   Ortho Exam  Patient is alert, oriented, no adenopathy, well-dressed, normal affect, normal respiratory effort. Examination patient has an ulcer beneath the left second toe.  After informed consent a 10 blade knife was used to debride the skin and soft tissue back to healthy viable tissue.  After debridement the ulcer is 1 cm diameter 1 mm deep without exposed bone or tendon.  There is no cellulitis no sausage digit swelling.  Patient also has thickened discolored onychomycotic nails and these were trimmed x 5 without complication.  Imaging: No results found. No images are attached to the encounter.  Labs: Lab Results  Component Value Date   HGBA1C 8.3 (H) 01/22/2023   HGBA1C 10.2 (H) 10/25/2022   HGBA1C 11.8 (H) 06/15/2022   ESRSEDRATE 20 (H) 03/08/2022   ESRSEDRATE 81 (H) 10/21/2019   ESRSEDRATE 25 (H) 06/17/2018   CRP 10.3 (H) 01/28/2021   CRP 12.8 (H) 01/27/2021   CRP 21.1 (H) 01/26/2021   LABURIC 10.9 (H) 10/23/2022   REPTSTATUS 01/25/2023 FINAL 01/20/2023   GRAMSTAIN NO  WBC SEEN NO ORGANISMS SEEN  01/20/2023   CULT  01/20/2023    RARE STAPHYLOCOCCUS CAPITIS NO ANAEROBES ISOLATED Performed at [facility], [address], [place], [place] [address]    [facility] STAPHYLOCOCCUS CAPITIS 01/20/2023     Lab Results  Component Value Date   ALBUMIN 4.1 01/22/2023   ALBUMIN 4.4 11/08/2022   ALBUMIN 3.6 10/23/2022   PREALBUMIN 6.8 (L) 10/21/2019    Lab Results  Component Value Date   MG 2.3 11/29/2021   MG 1.5 (L) 04/12/2021   MG 1.8 02/16/2021   Lab Results  Component Value Date   VD25OH 11 (L) 08/16/2009    Lab Results  Component Value Date   PREALBUMIN 6.8 (L) 10/21/2019      Latest Ref Rng & Units 01/22/2023   10:58 AM 11/21/2022    8:44 PM 11/20/2022   12:26 PM  CBC EXTENDED  WBC 4.0 - 10.5 K/uL 8.1  10.8  10.3   RBC 4.22 - 5.81 Mil/uL 4.55  4.21  4.36   Hemoglobin 13.0 - 17.0 g/dL 36.6  44.0  34.7   HCT 39.0 - 52.0 % 41.9  38.7  39.7   Platelets 150.0 - 400.0 K/uL 149.0  103  120   NEUT# 1.7 - 7.7 K/uL  8.5  Lymph# 0.7 - 4.0 K/uL  1.5       There is no height or weight on file to calculate BMI.  Orders:  No orders of the defined types were placed in this encounter.  No orders of the defined types were placed in this encounter.    Procedures: No procedures performed  Clinical Data: No additional findings.  ROS:  All other systems negative, except as noted in the HPI. Review of Systems  Objective: Vital Signs: There were no vitals taken for this visit.  Specialty Comments:  No specialty comments available.  PMFS History: Patient Active Problem List   Diagnosis Date Noted   BPH with obstruction/lower urinary tract symptoms 11/21/2022   UTI (urinary tract infection) 10/23/2022   ICD (implantable cardioverter-defibrillator) in place 04/19/2022   Near syncope 12/08/2021   CKD (chronic kidney disease) stage 3, GFR 30-59 ml/min (HCC) 12/08/2021   PVD s/p R LE amputation  12/08/2021   Diabetic retinopathy  associated with type 2 diabetes mellitus (HCC) 06/06/2021   CAD (coronary artery disease) with flat troponin  04/11/2021   S/P below knee amputation, right (HCC) 02/07/2021   AKI (acute kidney injury) (HCC) 01/23/2021   QT prolongation 07/30/2019   Acute on chronic combined systolic and diastolic CHF (congestive heart failure) (HCC) 07/29/2019   Stroke (HCC) 05/08/2017   Uncontrolled type 2 diabetes mellitus with hyperglycemia, with long-term current use of insulin (HCC) 12/07/2015   Chronic systolic heart failure (HCC) 10/02/2011   Organic impotence 10/02/2011   Obstructive sleep apnea 10/11/2007   HLD (hyperlipidemia) 10/10/2007   HYPOKALEMIA 10/10/2007   Obesity, Class III, BMI 40-49.9 (morbid obesity) (HCC) 10/10/2007   Essential hypertension 10/10/2007   PREMATURE VENTRICULAR CONTRACTIONS 10/10/2007   COLONIC POLYPS, HX OF 10/10/2007   Past Medical History:  Diagnosis Date   Bipolar disorder (HCC)    CAD (coronary artery disease)    a. diffuse 3v CAD by cath 2019, medical therapy recommended.   Cataract    forming    Chronic systolic CHF (congestive heart failure) (HCC)    CVA (cerebral infarction)    No residual deficits   Depression    PTSD,    Diabetes mellitus    TYPE II; UNCONTROLLED BY HEMOGLOBIN A1c; STABLE AS  PER DISCHARGE   Headache(784.0)    Herpes simplex of male genitalia    History of colonic polyps    Hyperlipidemia    Hypertension    Myocardial infarction (HCC) 1987   (while playing football)   Obesity    OSA (obstructive sleep apnea)    repeat study 2018 without significant OSA   Pneumonia    Post-cardiac injury syndrome (HCC)    History of cardiac injury from blunt trauma   Pulmonary hypertension (HCC)    a. moderately elevated PASP 07/2019.   PVCs (premature ventricular contractions)    Schizophrenia (HCC)    Goes to [facility]   Sleep apnea    Stroke [facility]) 2005   some left side weakness   Syncope    Recurrent, thought to  be vasovagal. Also has h/o frequent PVCs.     Family History  Problem Relation Age of Onset   Heart disease Mother        MI   Heart failure Mother    Diabetes Mother        ALSO IN MOST OF HIS SIBLINGS; 2 UNLCES HAVE ALSO PASSED AWAY FROM DM   Cardiomyopathy Mother    Cancer - Ovarian Mother  Ovarian cancer Mother    Heart disease Father    Hypertension Father    Diabetes Father    Diabetes Sister    Diabetes Brother    Colon cancer Paternal Uncle    Colon cancer Paternal Uncle    Colon polyps Neg Hx    Esophageal cancer Neg Hx    Rectal cancer Neg Hx    Stomach cancer Neg Hx     Past Surgical History:  Procedure Laterality Date   AMPUTATION Right 01/25/2021   Procedure: RIGHT BELOW KNEE AMPUTATION;  Surgeon: Nadara Mustard, MD;  Location: [facility] OR;  Service: Orthopedics;  Laterality: Right;   CARDIAC CATHETERIZATION  12/19/2010   DIFFUSE NONOBSTRUCTIVE CAD; NONISCHEMIC CARDIOMYOPATHY; LEFT VENTRICULAR ANGIOGRAM WAS PERFORMED SECONDARY TO  ELEVATED LEFT VENTRICULAR FILLING PRESSURES   COLONOSCOPY     ~ age [age]   COLONOSCOPY W/ POLYPECTOMY     I & D EXTREMITY Bilateral 10/24/2019   Procedure: IRRIGATION AND DEBRIDEMENT BILATERAL EXTREMITY WOUND ON FOOT;  Surgeon: Allena Napoleon, MD;  Location: [facility] OR;  Service: Plastics;  Laterality: Bilateral;   ICD IMPLANT N/A 01/13/2022   Procedure: ICD IMPLANT;  Surgeon: Marinus Maw, MD;  Location[facility] INVASIVE CV LAB;  Service: Cardiovascular;  Laterality: N/A;   METATARSAL HEAD EXCISION Right 08/29/2018   Procedure: METATARSAL HEAD RESECTION;  Surgeon: Felecia Shelling, DPM;  Location: [facility] OR;  Service: Podiatry;  Laterality: Right;   METATARSAL OSTEOTOMY Right 08/29/2018   Procedure: SUB FIFTHE METATARSIA RIGHT FOOT;  Surgeon: Felecia Shelling, DPM;  Location: [facility] OR;  Service: Podiatry;  Laterality: Right;   MULTIPLE EXTRACTIONS WITH ALVEOLOPLASTY  01/27/2014   "all my teeth; 4 Quadrants of alveoloplasty   MULTIPLE EXTRACTIONS WITH  ALVEOLOPLASTY N/A 01/27/2014   Procedure: EXTRACTION OF TEETH #'1, 2, 3, 4, 5, 6, 7, 8, 9, 10, 11, 12, 13, 14, 15, 16, 17, 20, 21, 22, 23, 24, 25, 26, 27, 28, 29, 31 and 32 WITH ALVEOLOPLASTY;  Surgeon: Charlynne Pander, DDS;  Location: [facility] OR;  Service: Oral Surgery;  Laterality: N/A;   ORIF FINGER / THUMB FRACTURE Right    POLYPECTOMY     RIGHT HEART CATH N/A 01/27/2021   Procedure: RIGHT HEART CATH;  Surgeon: Dolores Patty, MD;  Location: [facility] INVASIVE CV LAB;  Service: Cardiovascular;  Laterality: N/A;   RIGHT/LEFT HEART CATH AND CORONARY ANGIOGRAPHY N/A 09/26/2017   Procedure: RIGHT/LEFT HEART CATH AND CORONARY ANGIOGRAPHY;  Surgeon: Dolores Patty, MD;  Location: [facility] INVASIVE CV LAB;  Service: Cardiovascular;  Laterality: N/A;   RIGHT/LEFT HEART CATH AND CORONARY ANGIOGRAPHY N/A 04/12/2021   Procedure: RIGHT/LEFT HEART CATH AND CORONARY ANGIOGRAPHY;  Surgeon: Dolores Patty, MD;  Location: [facility] INVASIVE CV LAB;  Service: Cardiovascular;  Laterality: N/A;   SKIN SPLIT GRAFT Bilateral 10/24/2019   Procedure: SKIN GRAFT SPLIT THICKNESS LEFT THIGH;  Surgeon: Allena Napoleon, MD;  Location: [facility] OR;  Service: Plastics;  Laterality: Bilateral;   WOUND DEBRIDEMENT Right 08/29/2018   Procedure: Debridement of ulcer on right fifth metatarsal;  Surgeon: Felecia Shelling, DPM;  Location: [facility] OR;  Service: Podiatry;  Laterality: Right;   Social History   Occupational History   Occupation: Mortician  Tobacco Use   Smoking status: Never   Smokeless tobacco: Never  Vaping Use   Vaping status: Never Used  Substance and Sexual Activity   Alcohol use: No   Drug use: No   Sexual activity: Not on file

## 2023-02-19 ENCOUNTER — Ambulatory Visit: Payer: No Typology Code available for payment source | Admitting: Orthopedic Surgery

## 2023-02-22 ENCOUNTER — Ambulatory Visit (INDEPENDENT_AMBULATORY_CARE_PROVIDER_SITE_OTHER): Payer: No Typology Code available for payment source | Admitting: Urology

## 2023-02-22 ENCOUNTER — Encounter: Payer: Self-pay | Admitting: Urology

## 2023-02-22 VITALS — BP 117/82 | HR 72 | Ht 71.0 in | Wt 291.0 lb

## 2023-02-22 DIAGNOSIS — N529 Male erectile dysfunction, unspecified: Secondary | ICD-10-CM

## 2023-02-22 NOTE — Progress Notes (Signed)
Assessment: 1. Organic impotence     Plan: Injection procedure reviewed with the patient. He will try TriMix 10/30/1 0.3 ml per injection at home and let me know results Return to office in 2 months  Chief Complaint:  Chief Complaint  Patient presents with   Erectile Dysfunction    History of Present Illness:  Mitchell [name] is a 52 y.o. male who is seen for further evaluation of BPH with lower urinary tract symptoms, left flank pain, and erectile dysfunction. He has a history of lower urinary tract symptoms including frequency and nocturia for years.  He associates this with his use of torsemide.  No dysuria.  He reported an episode of gross hematuria prior to his hospitalization in March 2024.  Urinalysis at that time showed >50 WBCs, 11-20 RBCs, and rare bacteria.  No culture was obtained.  No further episodes of gross hematuria.  He was previously on tamsulosin. IPSS = 9. PVR = 86 ml  He reported intermittent left flank pain for 6 months. His symptoms are positional and associated with certain movements. CT abdomen and pelvis without contrast from 10/23/2022 showed no renal or ureteral calculi, no renal mass or evidence of obstruction.  He has a history of erectile dysfunction.  He has been unable to achieve an adequate erection for intercourse.  He has previously tried sildenafil without improvement.  He has also tried a vacuum erection device with unsatisfactory results.  He has seen Dr. Benancio Deeds at [facility] urology in 2021 and was prescribed prostaglandin for intracavernosal injection.  He reported minimal results with the dose prescribed and did not follow-up after his visit in November 2021.  PSA 3/24:  1.4 with 24% free  At his visit on 01/02/23, he reported that his urinary symptoms were stable.  He had recently run out of tamsulosin.  He was not having any dysuria or gross hematuria. He had not seen any results with tadalafil 20 mg as needed.  He continued to have trouble  achieving and maintaining his erections. He underwent a trial injection of Trimix 10/30/1 0.2 milliliters on 01/10/2023.  He did not achieve a satisfactory erection in the office.  He returns today for follow-up.  He tried an injection at home with 0.3 mL but noted bleeding and did not complete the entire injection.  He has not tried since that time.  Portions of the above documentation were copied from a prior visit for review purposes only.   Past Medical History:  Past Medical History:  Diagnosis Date   Bipolar disorder (HCC)    CAD (coronary artery disease)    a. diffuse 3v CAD by cath 2019, medical therapy recommended.   Cataract    forming    Chronic systolic CHF (congestive heart failure) (HCC)    CVA (cerebral infarction)    No residual deficits   Depression    PTSD,    Diabetes mellitus    TYPE II; UNCONTROLLED BY HEMOGLOBIN A1c; STABLE AS  PER DISCHARGE   Headache(784.0)    Herpes simplex of male genitalia    History of colonic polyps    Hyperlipidemia    Hypertension    Myocardial infarction (HCC) 1987   (while playing football)   Obesity    OSA (obstructive sleep apnea)    repeat study 2018 without significant OSA   Pneumonia    Post-cardiac injury syndrome (HCC)    History of cardiac injury from blunt trauma   Pulmonary hypertension (HCC)    a. moderately  elevated PASP 07/2019.   PVCs (premature ventricular contractions)    Schizophrenia (HCC)    Goes to [facility]   Sleep apnea    Stroke [facility] 2005   some left side weakness   Syncope    Recurrent, thought to be vasovagal. Also has h/o frequent PVCs.     Past Surgical History:  Past Surgical History:  Procedure Laterality Date   AMPUTATION Right 01/25/2021   Procedure: RIGHT BELOW KNEE AMPUTATION;  Surgeon: Nadara Mustard, MD;  Location: [facility] OR;  Service: Orthopedics;  Laterality: Right;   CARDIAC CATHETERIZATION  12/19/2010   DIFFUSE NONOBSTRUCTIVE CAD; NONISCHEMIC CARDIOMYOPATHY;  LEFT VENTRICULAR ANGIOGRAM WAS PERFORMED SECONDARY TO  ELEVATED LEFT VENTRICULAR FILLING PRESSURES   COLONOSCOPY     ~ age 40-23   COLONOSCOPY W/ POLYPECTOMY     I & D EXTREMITY Bilateral 10/24/2019   Procedure: IRRIGATION AND DEBRIDEMENT BILATERAL EXTREMITY WOUND ON FOOT;  Surgeon: Allena Napoleon, MD;  [facility];  Service: Plastics;  Laterality: Bilateral;   ICD IMPLANT N/A 01/13/2022   Procedure: ICD IMPLANT;  Surgeon: Marinus Maw, MD;  Location: [facility] INVASIVE CV LAB;  Service: Cardiovascular;  Laterality: N/A;   METATARSAL HEAD EXCISION Right 08/29/2018   Procedure: METATARSAL HEAD RESECTION;  Surgeon: Felecia Shelling, DPM;  [facility]: [facility] OR;  Service: Podiatry;  Laterality: Right;   METATARSAL OSTEOTOMY Right 08/29/2018   Procedure: SUB FIFTHE METATARSIA RIGHT FOOT;  Surgeon: Felecia Shelling, DPM;  [facility] OR;  Service: Podiatry;  Laterality: Right;   MULTIPLE EXTRACTIONS WITH ALVEOLOPLASTY  01/27/2014   "all my teeth; 4 Quadrants of alveoloplasty   MULTIPLE EXTRACTIONS WITH ALVEOLOPLASTY N/A 01/27/2014   Procedure: EXTRACTION OF TEETH #'1, 2, 3, 4, 5, 6, 7, 8, 9, 10, 11, 12, 13, 14, 15, 16, 17, 20, 21, 22, 23, 24, 25, 26, 27, 28, 29, 31 and 32 WITH ALVEOLOPLASTY;  Surgeon: Charlynne Pander, DDS;  [facility] OR;  Service: Oral Surgery;  Laterality: N/A;   ORIF FINGER / THUMB FRACTURE Right    POLYPECTOMY     RIGHT HEART CATH N/A 01/27/2021   Procedure: RIGHT HEART CATH;  Surgeon: Dolores Patty, MD;  [facility] INVASIVE CV LAB;  Service: Cardiovascular;  Laterality: N/A;   RIGHT/LEFT HEART CATH AND CORONARY ANGIOGRAPHY N/A 09/26/2017   Procedure: RIGHT/LEFT HEART CATH AND CORONARY ANGIOGRAPHY;  Surgeon: Dolores Patty, MD;  [facility] INVASIVE CV LAB;  Service: Cardiovascular;  Laterality: N/A;   RIGHT/LEFT HEART CATH AND CORONARY ANGIOGRAPHY N/A 04/12/2021   Procedure: RIGHT/LEFT HEART CATH AND CORONARY ANGIOGRAPHY;  Surgeon: Dolores Patty, MD;  [facility] INVASIVE CV LAB;  Service: Cardiovascular;  Laterality: N/A;   SKIN SPLIT GRAFT Bilateral 10/24/2019   Procedure: SKIN GRAFT SPLIT THICKNESS LEFT THIGH;  Surgeon: Allena Napoleon, MD;  [facility] OR;  Service: Plastics;  Laterality: Bilateral;   WOUND DEBRIDEMENT Right 08/29/2018   Procedure: Debridement of ulcer on right fifth metatarsal;  Surgeon: Felecia Shelling, DPM;  [facility];  Service: Podiatry;  Laterality: Right;    Allergies:  Allergies  Allergen Reactions   Farxiga [Dapagliflozin] Other (See Comments)    Patient reports loss of consciousness   Nsaids Anaphylaxis and Rash    Able to tolerate aspirin    Trulicity [Dulaglutide] Nausea And Vomiting    Family History:  Family History  Problem Relation Age of Onset   Heart disease Mother  MI   Heart failure Mother    Diabetes Mother        ALSO IN MOST OF HIS SIBLINGS; 2 UNLCES HAVE ALSO PASSED AWAY FROM DM   Cardiomyopathy Mother    Cancer - Ovarian Mother    Ovarian cancer Mother    Heart disease Father    Hypertension Father    Diabetes Father    Diabetes Sister    Diabetes Brother    Colon cancer Paternal Uncle    Colon cancer Paternal Uncle    Colon polyps Neg Hx    Esophageal cancer Neg Hx    Rectal cancer Neg Hx    Stomach cancer Neg Hx     Social History:  Social History   Tobacco Use   Smoking status: Never   Smokeless tobacco: Never  Vaping Use   Vaping status: Never Used  Substance Use Topics   Alcohol use: No   Drug use: No    ROS: Constitutional:  Negative for fever, chills, weight loss CV: Negative for chest pain, previous MI, hypertension Respiratory:  Negative for shortness of breath, wheezing, sleep apnea, frequent cough GI:  Negative for nausea, vomiting, bloody stool, GERD  Physical exam: BP 117/82   Pulse 72   Ht 5\' 11"  (1.803 m)   Wt 291 lb (132 kg)   BMI 40.59 kg/m  GENERAL APPEARANCE:  Well appearing, well developed, well nourished, NAD HEENT:  Atraumatic,  normocephalic, oropharynx clear NECK:  Supple without lymphadenopathy or thyromegaly ABDOMEN:  Soft, non-tender, no masses EXTREMITIES:  Moves all extremities well, without clubbing, cyanosis, or edema NEUROLOGIC:  Alert and oriented x 3, normal gait, CN II-XII grossly intact MENTAL STATUS:  appropriate BACK:  Non-tender to palpation, No CVAT SKIN:  Warm, dry, and intact GU: Penis:  uncircumcised; no ecchymosis Meatus: Normal  Results: None

## 2023-03-16 ENCOUNTER — Encounter: Payer: Self-pay | Admitting: Internal Medicine

## 2023-03-16 ENCOUNTER — Ambulatory Visit (INDEPENDENT_AMBULATORY_CARE_PROVIDER_SITE_OTHER): Payer: No Typology Code available for payment source | Admitting: Internal Medicine

## 2023-03-16 VITALS — BP 120/70 | HR 77 | Ht 71.0 in | Wt 299.0 lb

## 2023-03-16 DIAGNOSIS — E1122 Type 2 diabetes mellitus with diabetic chronic kidney disease: Secondary | ICD-10-CM | POA: Diagnosis not present

## 2023-03-16 DIAGNOSIS — E1142 Type 2 diabetes mellitus with diabetic polyneuropathy: Secondary | ICD-10-CM

## 2023-03-16 DIAGNOSIS — E1159 Type 2 diabetes mellitus with other circulatory complications: Secondary | ICD-10-CM

## 2023-03-16 DIAGNOSIS — Z794 Long term (current) use of insulin: Secondary | ICD-10-CM

## 2023-03-16 DIAGNOSIS — Z89511 Acquired absence of right leg below knee: Secondary | ICD-10-CM

## 2023-03-16 DIAGNOSIS — N1831 Chronic kidney disease, stage 3a: Secondary | ICD-10-CM

## 2023-03-16 DIAGNOSIS — E1165 Type 2 diabetes mellitus with hyperglycemia: Secondary | ICD-10-CM | POA: Diagnosis not present

## 2023-03-16 DIAGNOSIS — E118 Type 2 diabetes mellitus with unspecified complications: Secondary | ICD-10-CM

## 2023-03-16 DIAGNOSIS — E119 Type 2 diabetes mellitus without complications: Secondary | ICD-10-CM | POA: Insufficient documentation

## 2023-03-16 LAB — POCT GLUCOSE (DEVICE FOR HOME USE): Glucose Fasting, POC: 254 mg/dL — AB (ref 70–99)

## 2023-03-16 MED ORDER — EMPAGLIFLOZIN 10 MG PO TABS: 10.0000 mg | ORAL_TABLET | Freq: Every day | ORAL | 1 refills | Status: DC

## 2023-03-16 MED ORDER — BASAGLAR KWIKPEN 100 UNIT/ML ~~LOC~~ SOPN: 36.0000 [IU] | PEN_INJECTOR | Freq: Every day | SUBCUTANEOUS | 3 refills | Status: DC

## 2023-03-16 MED ORDER — GLIPIZIDE 5 MG PO TABS
5.0000 mg | ORAL_TABLET | Freq: Two times a day (BID) | ORAL | 3 refills | Status: DC
Start: 2023-03-16 — End: 2024-04-14

## 2023-03-16 MED ORDER — BD PEN NEEDLE MICRO U/F 32G X 6 MM MISC: 1.0000 | Freq: Every day | 3 refills | Status: DC

## 2023-03-16 NOTE — Patient Instructions (Addendum)
Stop Glipizide 5 mg XL daily Start (plain) glipizide, 1 tablet before breakfast and 1 tablet before supper (it is better if you take this 15-20 minutes before the meal) Start Jardiance 10 mg, 1 tablet every morning (you can take this whether you eat or not) Decrease Basaglar 36 units once daily     HOW TO TREAT LOW BLOOD SUGARS (Blood sugar LESS THAN 70 MG/DL) Please follow the RULE OF 15 for the treatment of hypoglycemia treatment (when your (blood sugars are less than 70 mg/dL)   STEP 1: Take 15 grams of carbohydrates when your blood sugar is low, which includes:  3-4 GLUCOSE TABS  OR 3-4 OZ OF JUICE OR REGULAR SODA OR ONE TUBE OF GLUCOSE GEL    STEP 2: RECHECK blood sugar in 15 MINUTES STEP 3: If your blood sugar is still low at the 15 minute recheck --> then, go back to STEP 1 and treat AGAIN with another 15 grams of carbohydrates.

## 2023-03-16 NOTE — Progress Notes (Signed)
Name: Mitchell [name]  Age/ Sex: 52 y.o., male   MRN/ DOB: [id number], 05-01-1971     PCP: Pearline Cables, MD   Reason for Endocrinology Evaluation: Type 2 Diabetes Mellitus  Initial Endocrine Consultative Visit: 09/13/2017    PATIENT IDENTIFIER: Mitchell [name] is a 52 y.o. male with a past medical history of DM, OSA, CAD, Hx CVA and CHF. The patient has followed with Endocrinology clinic since 09/13/2017 for consultative assistance with management of his diabetes.  DIABETIC HISTORY:  [name] was diagnosed with DM 1981, Trulicity-nausea and vomiting ,he was started on insulin and in 2017. His hemoglobin A1c has ranged from 7.8% in 2023, peaking at 12.7% in 2020.   He was followed by Dr. Everardo All from 2019 until 07/2021   SUBJECTIVE:   During the last visit (08/19/2021): Saw Dr. Everardo All    Today (03/16/2023): Mr. [name]  He checks his blood sugars 3 times a week. The patient has  had hypoglycemic episodes since the last clinic visit. The patient is  symptomatic with these episodes.  Denies nausea or vomiting  Denies constipation or diarrhea    Pt eats 3 meals a day , drinks juice.  On disability    HOME DIABETES REGIMEN:  Glipizide 5 mg XL daily Basaglar 42 units daily    Statin: yes ACE-I/ARB: yes   METER DOWNLOAD SUMMARY: n/a   DIABETIC COMPLICATIONS: Microvascular complications:  CKD III, neuropathy, DR B/L (  S/P RBK amputation 2021) Denies:  Last Eye Exam: Completed 2024  Macrovascular complications:  CHF, CVA, CAD Denies:PVD   HISTORY:  Past Medical History:  Past Medical History:  Diagnosis Date   Bipolar disorder (HCC)    CAD (coronary artery disease)    a. diffuse 3v CAD by cath 2019, medical therapy recommended.   Cataract    forming    Chronic systolic CHF (congestive heart failure) (HCC)    CVA (cerebral infarction)    No residual deficits   Depression    PTSD,    Diabetes mellitus    TYPE II; UNCONTROLLED BY HEMOGLOBIN  A1c; STABLE AS  PER DISCHARGE   Headache(784.0)    Herpes simplex of male genitalia    History of colonic polyps    Hyperlipidemia    Hypertension    Myocardial infarction (HCC) 1987   (while playing football)   Obesity    OSA (obstructive sleep apnea)    repeat study 2018 without significant OSA   Pneumonia    Post-cardiac injury syndrome (HCC)    History of cardiac injury from blunt trauma   Pulmonary hypertension (HCC)    a. moderately elevated PASP 07/2019.   PVCs (premature ventricular contractions)    Schizophrenia (HCC)    Goes to [facility]   Sleep apnea    Stroke [facility] 2005   some left side weakness   Syncope    Recurrent, thought to be vasovagal. Also has h/o frequent PVCs.    Past Surgical History:  Past Surgical History:  Procedure Laterality Date   AMPUTATION Right 01/25/2021   Procedure: RIGHT BELOW KNEE AMPUTATION;  Surgeon: Nadara Mustard, MD;  Location: [facility] OR;  Service: Orthopedics;  Laterality: Right;   CARDIAC CATHETERIZATION  12/19/2010   DIFFUSE NONOBSTRUCTIVE CAD; NONISCHEMIC CARDIOMYOPATHY; LEFT VENTRICULAR ANGIOGRAM WAS PERFORMED SECONDARY TO  ELEVATED LEFT VENTRICULAR FILLING PRESSURES   COLONOSCOPY     ~ age [age]   COLONOSCOPY W/ POLYPECTOMY     I & D EXTREMITY Bilateral 10/24/2019  Procedure: IRRIGATION AND DEBRIDEMENT BILATERAL EXTREMITY WOUND ON FOOT;  Surgeon: Allena Napoleon, MD;  [facility];  Service: Plastics;  Laterality: Bilateral;   ICD IMPLANT N/A 01/13/2022   Procedure: ICD IMPLANT;  Surgeon: Marinus Maw, MD;  Location: [facility] INVASIVE CV LAB;  Service: Cardiovascular;  Laterality: N/A;   METATARSAL HEAD EXCISION Right 08/29/2018   Procedure: METATARSAL HEAD RESECTION;  Surgeon: Felecia Shelling, DPM;  [facility];  Service: Podiatry;  Laterality: Right;   METATARSAL OSTEOTOMY Right 08/29/2018   Procedure: SUB FIFTHE METATARSIA RIGHT FOOT;  Surgeon: Felecia Shelling, DPM;  [facility];  Service: Podiatry;   Laterality: Right;   MULTIPLE EXTRACTIONS WITH ALVEOLOPLASTY  01/27/2014   "all my teeth; 4 Quadrants of alveoloplasty   MULTIPLE EXTRACTIONS WITH ALVEOLOPLASTY N/A 01/27/2014   Procedure: EXTRACTION OF TEETH #'1, 2, 3, 4, 5, 6, 7, 8, 9, 10, 11, 12, 13, 14, 15, 16, 17, 20, 21, 22, 23, 24, 25, 26, 27, 28, 29, 31 and 32 WITH ALVEOLOPLASTY;  Surgeon: Charlynne Pander, DDS;  [facility];  Service: Oral Surgery;  Laterality: N/A;   ORIF FINGER / THUMB FRACTURE Right    POLYPECTOMY     RIGHT HEART CATH N/A 01/27/2021   Procedure: RIGHT HEART CATH;  Surgeon: Dolores Patty, MD;  [facility] INVASIVE CV LAB;  Service: Cardiovascular;  Laterality: N/A;   RIGHT/LEFT HEART CATH AND CORONARY ANGIOGRAPHY N/A 09/26/2017   Procedure: RIGHT/LEFT HEART CATH AND CORONARY ANGIOGRAPHY;  Surgeon: Dolores Patty, MD;  [facility] INVASIVE CV LAB;  Service: Cardiovascular;  Laterality: N/A;   RIGHT/LEFT HEART CATH AND CORONARY ANGIOGRAPHY N/A 04/12/2021   Procedure: RIGHT/LEFT HEART CATH AND CORONARY ANGIOGRAPHY;  Surgeon: Dolores Patty, MD;  [facility] INVASIVE CV LAB;  Service: Cardiovascular;  Laterality: N/A;   SKIN SPLIT GRAFT Bilateral 10/24/2019   Procedure: SKIN GRAFT SPLIT THICKNESS LEFT THIGH;  Surgeon: Allena Napoleon, MD;  [facility] OR;  Service: Plastics;  Laterality: Bilateral;   WOUND DEBRIDEMENT Right 08/29/2018   Procedure: Debridement of ulcer on right fifth metatarsal;  Surgeon: Felecia Shelling, DPM;  [facility]: [facility] OR;  Service: Podiatry;  Laterality: Right;   Social History:  reports that he has never smoked. He has never used smokeless tobacco. He reports that he does not drink alcohol and does not use drugs. Family History:  Family History  Problem Relation Age of Onset   Heart disease Mother        MI   Heart failure Mother    Diabetes Mother        ALSO IN MOST OF HIS SIBLINGS; 2 UNLCES HAVE ALSO PASSED AWAY FROM DM   Cardiomyopathy Mother    Cancer - Ovarian  Mother    Ovarian cancer Mother    Heart disease Father    Hypertension Father    Diabetes Father    Diabetes Sister    Diabetes Brother    Colon cancer Paternal Uncle    Colon cancer Paternal Uncle    Colon polyps Neg Hx    Esophageal cancer Neg Hx    Rectal cancer Neg Hx    Stomach cancer Neg Hx      HOME MEDICATIONS: Allergies as of 03/16/2023       Reactions   Farxiga [dapagliflozin] Other (See Comments)   Patient reports loss of consciousness   Nsaids Anaphylaxis, Rash   Able to tolerate aspirin    Trulicity [dulaglutide] Nausea And Vomiting  Medication List        Accurate as of March 16, 2023  8:30 AM. If you have any questions, ask your nurse or doctor.          Accu-Chek Guide test strip Generic drug: glucose blood Use to test blood sugars up to 4 times daily as needed.   Accu-Chek Guide w/Device Kit Use to test blood sugar up to 4 times daily as needed.   Accu-Chek Softclix Lancets lancets Use to test blood sugars up to 4 times daily as needed.   acetaminophen-codeine 300-30 MG tablet Commonly known as: TYLENOL #3 Take 1 tablet by mouth every 6 (six) hours as needed for moderate pain.   amitriptyline 10 MG tablet Commonly known as: ELAVIL Take 1 tablet (10 mg total) by mouth at bedtime.   Aspirin Low Dose 81 MG tablet Generic drug: aspirin EC Take 1 tablet (81 mg total) by mouth daily.   Basaglar KwikPen 100 UNIT/ML Inject 42 Units into the skin daily.   blood glucose meter kit and supplies Dispense based on patient and insurance preference. Use up to four times daily as directed. (FOR ICD-10 E10.9, E11.9).   carvedilol 6.25 MG tablet Commonly known as: COREG Take 1 tablet (6.25 mg total) by mouth 2 (two) times daily with a meal.   clopidogrel 75 MG tablet Commonly known as: PLAVIX Take 1 tablet (75 mg total) by mouth daily.   Entresto 49-51 MG Generic drug: sacubitril-valsartan Take 1 tablet by mouth 2 (two) times daily.    gabapentin 100 MG capsule Commonly known as: NEURONTIN Take 1 capsule (100 mg total) by mouth 2 (two) times daily.   glipiZIDE 5 MG 24 hr tablet Commonly known as: GLUCOTROL XL Take 1 tablet (5 mg total) by mouth daily with breakfast. Take with first meal of the day   methocarbamol 500 MG tablet Commonly known as: ROBAXIN TAKE 1 TABLET BY MOUTH EVERY 6 HOURS AS NEEDED FOR MUSCLE SPASM   pantoprazole 40 MG tablet Commonly known as: PROTONIX Take 1 tablet (40 mg total) by mouth daily as needed (acid reflux).   potassium chloride SA 20 MEQ tablet Commonly known as: KLOR-CON M TAKE 1 TABLET BY MOUTH EVERY OTHER DAY   rosuvastatin 40 MG tablet Commonly known as: CRESTOR Take 1 tablet (40 mg total) by mouth daily.   spironolactone 25 MG tablet Commonly known as: ALDACTONE Take 0.5 tablets (12.5 mg total) by mouth daily.   tadalafil 20 MG tablet Commonly known as: CIALIS Take 1 tablet (20 mg total) by mouth daily as needed for erectile dysfunction.   tamsulosin 0.4 MG Caps capsule Commonly known as: FLOMAX Take 1 capsule (0.4 mg total) by mouth daily after supper.   torsemide 20 MG tablet Commonly known as: DEMADEX Take 1 tablet (20 mg total) by mouth daily.   VITAMIN C PO Take 1 tablet by mouth daily.   VITAMIN D-3 PO Take 1 capsule by mouth daily.         OBJECTIVE:   Vital Signs: BP 120/70 (BP Location: Left Arm, Patient Position: Sitting, Cuff Size: Large)   Pulse 77   Ht 5\' 11"  (1.803 m)   Wt 299 lb (135.6 kg)   SpO2 99%   BMI 41.70 kg/m   Wt Readings from Last 3 Encounters:  03/16/23 299 lb (135.6 kg)  02/22/23 291 lb (132 kg)  02/09/23 291 lb 0.1 oz (132 kg)     Exam: General: Pt appears well and is in NAD  Neck: General: Supple without adenopathy. Thyroid: Thyroid size normal.  No goiter or nodules appreciated.   Lungs: Clear with good BS bilat   Heart: RRR   Abdomen:  soft, nontender  Extremities: No pretibial edema.   Neuro: MS is good  with appropriate affect, pt is alert and Ox3    DM foot exam: 8/16/21024    Right BKA Left foot exam shows hypopigmented skin, deformed toes The pedal pulse undetectable  the sensation is intact to a screening 5.07, 10 gram monofilament on the left        DATA REVIEWED:  Lab Results  Component Value Date   HGBA1C 8.3 (H) 01/22/2023   HGBA1C 10.2 (H) 10/25/2022   HGBA1C 11.8 (H) 06/15/2022    Latest Reference Range & Units 01/22/23 10:58  Sodium 135 - 145 mEq/L 144  Potassium 3.5 - 5.1 mEq/L 4.4  Chloride 96 - 112 mEq/L 103  CO2 19 - 32 mEq/L 33 (H)  Glucose 70 - 99 mg/dL 440 (H)  BUN 6 - 23 mg/dL 28 (H)  Creatinine 1.02 - 1.50 mg/dL 7.25 (H)  Calcium 8.4 - 10.5 mg/dL 9.5  Alkaline Phosphatase 39 - 117 U/L 71  Albumin 3.5 - 5.2 g/dL 4.1  AST 0 - 37 U/L 17  ALT 0 - 53 U/L 10  Total Protein 6.0 - 8.3 g/dL 7.2  Total Bilirubin 0.2 - 1.2 mg/dL 0.3  GFR >36.64 mL/min 46.46 (L)    In office BG 254 mgdL    Old records , labs and images have been reviewed.    ASSESSMENT / PLAN / RECOMMENDATIONS:   1) Type 2 Diabetes Mellitus, Poorly controlled, With CKD III, retinopathic neuropathic and macrovascular complications - Most recent A1c of 8.3 %. Goal A1c < 7.0 %.    -Patient reports syncope with Marcelline Deist, I suspect this may be related to hypoglycemia -Intolerant to Trulicity due to nausea and vomiting -I have recommended switching glipizide XL to twice daily before meals -I will also start him on Jardiance due to cardiovascular renal benefits, the benefits outweigh the risk in this case -I will reduce basal insulin preemptively to avoid hypoglycemia -Patient declines gym technology, he has poor experience with it  MEDICATIONS: Stop glipizide 5 mg XL Start glipizide 5 mg twice daily Start Jardiance 10 mg daily Basaglar 36 units daily  EDUCATION / INSTRUCTIONS: BG monitoring instructions: Patient is instructed to check his blood sugars 1 times a day. Call [facility]  Endocrinology clinic if: BG persistently < 70  I reviewed the Rule of 15 for the treatment of hypoglycemia in detail with the patient. Literature supplied.    2) Diabetic complications:  Eye: Does  have known diabetic retinopathy.  Neuro/ Feet: Does  have known diabetic peripheral neuropathy .  Renal: Patient does have known baseline CKD. He   is  on an ACEI/ARB at present.      F/U in 3 months     Signed electronically by: Lyndle Herrlich, MD  [facility] Endocrinology  [facility] [address] [place] [place], [place] [address] Phone: [phone] FAX: [phone]   CC: Pearline Cables, MD [address] [place] [address] Phone: [phone]  Fax: [phone]  Return to Endocrinology clinic as below: Future Appointments  Date Time Provider Department Center  04/16/2023  7:05 AM [facility] DEVICE REMOTES [facility]  04/17/2023  2:30 PM Nadara Mustard, MD [facility] None  04/25/2023  9:15 AM Stoneking, Danford Bad., MD [facility]  None  07/16/2023  7:05 AM [facility] DEVICE REMOTES [facility]  10/15/2023  7:05 AM [facility] DEVICE REMOTES [facility]  01/14/2024  7:05 AM [facility] DEVICE REMOTES [facility]  04/14/2024  7:05 AM [facility] DEVICE REMOTES [facility]  07/14/2024  7:05 AM [facility] DEVICE REMOTES [facility]  10/13/2024  7:05 AM [facility] DEVICE REMOTES [facility]  01/12/2025  7:05 AM [facility] DEVICE REMOTES [facility]  04/13/2025  7:05 AM [facility] DEVICE REMOTES [facility]  07/13/2025  7:05 AM [facility] DEVICE REMOTES [facility]

## 2023-03-26 ENCOUNTER — Telehealth: Payer: Self-pay | Admitting: Family

## 2023-03-26 NOTE — Telephone Encounter (Signed)
Patient called asked if he can be worked into [employer] schedule or any other provider because his Left Foot and Lt toe is in a lot of pain. Patient said he need a callous trimmed. Patient said he need to be seen as soon as possible. The number to contact patient is [phone]

## 2023-03-26 NOTE — Telephone Encounter (Signed)
Called patient to schedule an appointment with another provider per [place] for left foot,left toe pain/pt also need callous trimmed.

## 2023-03-28 ENCOUNTER — Encounter: Payer: Self-pay | Admitting: Physician Assistant

## 2023-03-28 ENCOUNTER — Ambulatory Visit (INDEPENDENT_AMBULATORY_CARE_PROVIDER_SITE_OTHER): Payer: No Typology Code available for payment source | Admitting: Physician Assistant

## 2023-03-28 DIAGNOSIS — Z89511 Acquired absence of right leg below knee: Secondary | ICD-10-CM

## 2023-03-28 DIAGNOSIS — L84 Corns and callosities: Secondary | ICD-10-CM

## 2023-03-28 NOTE — Progress Notes (Signed)
Office Visit Note   Patient: Mitchell [name]           Date of Birth: May 07, 1971           MRN: [id number] Visit Date: 03/28/2023              Requested by: Pearline Cables, MD [address] [place],  [place] [address] PCP: Pearline Cables, MD  Painful callus    HPI: Mitchell [name] is a pleasant 52 year old gentleman who is a patient of Dr. Audrie Lia.  He is status post right below-knee amputation.  He has clawing of his second toe on his left foot and periodically has his callus trimmed.  Last had it trimmed by Dr. Due to a month ago.  He has had some changes in his footwear and calluses reaccumulated quicker.  He denies any infection but is just requesting the callus to be trimmed today again.  He does have a follow-up with Dr. Lajoyce Corners in 1 month  Assessment & Plan: Visit Diagnoses: Left second claw toe with painful callus  Plan: After verbal consent callus was debrided to a soft surface.  No evidence of any infection or cellulitis he just had a spot of blood.  Will continue to monitor his foot daily will follow-up with Dr. Lajoyce Corners as previously prescribed  Follow-Up Instructions: No follow-ups on file.   Ortho Exam  Patient is alert, oriented, no adenopathy, well-dressed, normal affect, normal respiratory effort. Examination of his left foot no erythema no swelling foot is warm no ascending cellulitis.  He does have a second claw toe with a thickened callus at the end of the tip of the toe.  No surrounding erythema or drainage foot is warm  Imaging: No results found. No images are attached to the encounter.  Labs: Lab Results  Component Value Date   HGBA1C 8.3 (H) 01/22/2023   HGBA1C 10.2 (H) 10/25/2022   HGBA1C 11.8 (H) 06/15/2022   ESRSEDRATE 20 (H) 03/08/2022   ESRSEDRATE 81 (H) 10/21/2019   ESRSEDRATE 25 (H) 06/17/2018   CRP 10.3 (H) 01/28/2021   CRP 12.8 (H) 01/27/2021   CRP 21.1 (H) 01/26/2021   LABURIC 10.9 (H) 10/23/2022   REPTSTATUS 01/25/2023 FINAL  01/20/2023   GRAMSTAIN NO WBC SEEN NO ORGANISMS SEEN  01/20/2023   CULT  01/20/2023    RARE STAPHYLOCOCCUS CAPITIS NO ANAEROBES ISOLATED Performed at [facility], [address], [place], [place] [address]    [facility] STAPHYLOCOCCUS CAPITIS 01/20/2023     Lab Results  Component Value Date   ALBUMIN 4.1 01/22/2023   ALBUMIN 4.4 11/08/2022   ALBUMIN 3.6 10/23/2022   PREALBUMIN 6.8 (L) 10/21/2019    Lab Results  Component Value Date   MG 2.3 11/29/2021   MG 1.5 (L) 04/12/2021   MG 1.8 02/16/2021   Lab Results  Component Value Date   VD25OH 11 (L) 08/16/2009    Lab Results  Component Value Date   PREALBUMIN 6.8 (L) 10/21/2019      Latest Ref Rng & Units 01/22/2023   10:58 AM 11/21/2022    8:44 PM 11/20/2022   12:26 PM  CBC EXTENDED  WBC 4.0 - 10.5 K/uL 8.1  10.8  10.3   RBC 4.22 - 5.81 Mil/uL 4.55  4.21  4.36   Hemoglobin 13.0 - 17.0 g/dL 32.9  51.8  84.1   HCT 39.0 - 52.0 % 41.9  38.7  39.7   Platelets 150.0 - 400.0 K/uL 149.0  103  120   NEUT# 1.7 - 7.7 K/uL  8.5    Lymph# 0.7 - 4.0 K/uL  1.5       There is no height or weight on file to calculate BMI.  Orders:  No orders of the defined types were placed in this encounter.  No orders of the defined types were placed in this encounter.    Procedures: No procedures performed  Clinical Data: No additional findings.  ROS:  All other systems negative, except as noted in the HPI. Review of Systems  Objective: Vital Signs: There were no vitals taken for this visit.  Specialty Comments:  No specialty comments available.  PMFS History: Patient Active Problem List   Diagnosis Date Noted   History of right below knee amputation (HCC) 03/16/2023   Diabetes mellitus (HCC) 03/16/2023   BPH with obstruction/lower urinary tract symptoms 11/21/2022   UTI (urinary tract infection) 10/23/2022   ICD (implantable cardioverter-defibrillator) in place 04/19/2022   Near syncope 12/08/2021   CKD (chronic  kidney disease) stage 3, GFR 30-59 ml/min (HCC) 12/08/2021   PVD s/p R LE amputation  12/08/2021   Diabetic retinopathy associated with type 2 diabetes mellitus (HCC) 06/06/2021   CAD (coronary artery disease) with flat troponin  04/11/2021   S/P below knee amputation, right (HCC) 02/07/2021   AKI (acute kidney injury) (HCC) 01/23/2021   QT prolongation 07/30/2019   Acute on chronic combined systolic and diastolic CHF (congestive heart failure) (HCC) 07/29/2019   Stroke (HCC) 05/08/2017   Uncontrolled type 2 diabetes mellitus with hyperglycemia, with long-term current use of insulin (HCC) 12/07/2015   Chronic systolic heart failure (HCC) 10/02/2011   Organic impotence 10/02/2011   Obstructive sleep apnea 10/11/2007   HLD (hyperlipidemia) 10/10/2007   HYPOKALEMIA 10/10/2007   Obesity, Class III, BMI 40-49.9 (morbid obesity) (HCC) 10/10/2007   Essential hypertension 10/10/2007   PREMATURE VENTRICULAR CONTRACTIONS 10/10/2007   COLONIC POLYPS, HX OF 10/10/2007   Past Medical History:  Diagnosis Date   Bipolar disorder (HCC)    CAD (coronary artery disease)    a. diffuse 3v CAD by cath 2019, medical therapy recommended.   Cataract    forming    Chronic systolic CHF (congestive heart failure) (HCC)    CVA (cerebral infarction)    No residual deficits   Depression    PTSD,    Diabetes mellitus    TYPE II; UNCONTROLLED BY HEMOGLOBIN A1c; STABLE AS  PER DISCHARGE   Headache(784.0)    Herpes simplex of male genitalia    History of colonic polyps    Hyperlipidemia    Hypertension    Myocardial infarction (HCC) 1987   (while playing football)   Obesity    OSA (obstructive sleep apnea)    repeat study 2018 without significant OSA   Pneumonia    Post-cardiac injury syndrome (HCC)    History of cardiac injury from blunt trauma   Pulmonary hypertension (HCC)    a. moderately elevated PASP 07/2019.   PVCs (premature ventricular contractions)    Schizophrenia (HCC)    Goes to [facility]   Sleep apnea    Stroke [facility]) 2005   some left side weakness   Syncope    Recurrent, thought to be vasovagal. Also has h/o frequent PVCs.     Family History  Problem Relation Age of Onset   Heart disease Mother        MI   Heart failure Mother    Diabetes Mother  ALSO IN MOST OF HIS SIBLINGS; 2 UNLCES HAVE ALSO PASSED AWAY FROM DM   Cardiomyopathy Mother    Cancer - Ovarian Mother    Ovarian cancer Mother    Heart disease Father    Hypertension Father    Diabetes Father    Diabetes Sister    Diabetes Brother    Colon cancer Paternal Uncle    Colon cancer Paternal Uncle    Colon polyps Neg Hx    Esophageal cancer Neg Hx    Rectal cancer Neg Hx    Stomach cancer Neg Hx     Past Surgical History:  Procedure Laterality Date   AMPUTATION Right 01/25/2021   Procedure: RIGHT BELOW KNEE AMPUTATION;  Surgeon: Nadara Mustard, MD;  Location: [facility] OR;  Service: Orthopedics;  Laterality: Right;   CARDIAC CATHETERIZATION  12/19/2010   DIFFUSE NONOBSTRUCTIVE CAD; NONISCHEMIC CARDIOMYOPATHY; LEFT VENTRICULAR ANGIOGRAM WAS PERFORMED SECONDARY TO  ELEVATED LEFT VENTRICULAR FILLING PRESSURES   COLONOSCOPY     ~ age [age]   COLONOSCOPY W/ POLYPECTOMY     I & D EXTREMITY Bilateral 10/24/2019   Procedure: IRRIGATION AND DEBRIDEMENT BILATERAL EXTREMITY WOUND ON FOOT;  Surgeon: Allena Napoleon, MD;  Location: [facility] OR;  Service: Plastics;  Laterality: Bilateral;   ICD IMPLANT N/A 01/13/2022   Procedure: ICD IMPLANT;  Surgeon: Marinus Maw, MD;  Location: [facility] INVASIVE CV LAB;  Service: Cardiovascular;  Laterality: N/A;   METATARSAL HEAD EXCISION Right 08/29/2018   Procedure: METATARSAL HEAD RESECTION;  Surgeon: Felecia Shelling, DPM;  Location: [facility];  Service: Podiatry;  Laterality: Right;   METATARSAL OSTEOTOMY Right 08/29/2018   Procedure: SUB FIFTHE METATARSIA RIGHT FOOT;  Surgeon: Felecia Shelling, DPM;  Location: [facility];  Service: Podiatry;  Laterality: Right;   MULTIPLE  EXTRACTIONS WITH ALVEOLOPLASTY  01/27/2014   "all my teeth; 4 Quadrants of alveoloplasty   MULTIPLE EXTRACTIONS WITH ALVEOLOPLASTY N/A 01/27/2014   Procedure: EXTRACTION OF TEETH #'1, 2, 3, 4, 5, 6, 7, 8, 9, 10, 11, 12, 13, 14, 15, 16, 17, 20, 21, 22, 23, 24, 25, 26, 27, 28, 29, 31 and 32 WITH ALVEOLOPLASTY;  Surgeon: Charlynne Pander, DDS;  Location: [facility];  Service: Oral Surgery;  Laterality: N/A;   ORIF FINGER / THUMB FRACTURE Right    POLYPECTOMY     RIGHT HEART CATH N/A 01/27/2021   Procedure: RIGHT HEART CATH;  Surgeon: Dolores Patty, MD;  Location: [facility] INVAS[facility] CV LAB;  Service: Cardiovascular;  Laterality: N/A;   RIGHT/LEFT HEART CATH AND CORONARY ANGIOGRAPHY N/A 09/26/2017   Procedure: RIGHT/LEFT HEART CATH AND CORONARY ANGIOGRAPHY;  Surgeon: Dolores Patty, MD;  [facility]: [facility] CV LAB;  Service: Cardiovascular;  Laterality: N/A;   RIGHT/LEFT HEART CATH AND CORONARY ANGIOGRAPHY N/A 04/12/2021   Procedure: RIGHT/LEFT HEART CATH AND CORONARY ANGIOGRAPHY;  Surgeon: Dolores Patty, MD;  [facility] INV[facility] LAB;  Service: Cardiovascular;  Laterality: N/A;   SKIN SPLIT GRAFT Bilateral 10/24/2019   Procedure: SKIN GRAFT SPLIT THICKNESS LEFT THIGH;  Surgeon: Allena Napoleon, MD;  [facility]: [facility];  Service: Plastics;  Laterality: Bilateral;   WOUND DEBRIDEMENT Right 08/29/2018   Procedure: Debridement of ulcer on right fifth metatarsal;  Surgeon: Felecia Shelling, DPM;  [facility]: [facility];  Service: Podiatry;  Laterality: Right;   Social History   Occupational History   Occupation: Mortician  Tobacco Use   Smoking status: Never   Smokeless tobacco: Never  Vaping Use   Vaping status:  Never Used  Substance and Sexual Activity   Alcohol use: No   Drug use: No   Sexual activity: Not on file

## 2023-04-07 ENCOUNTER — Other Ambulatory Visit (HOSPITAL_COMMUNITY): Payer: Self-pay | Admitting: Family Medicine

## 2023-04-07 DIAGNOSIS — I10 Essential (primary) hypertension: Secondary | ICD-10-CM

## 2023-04-07 DIAGNOSIS — I5022 Chronic systolic (congestive) heart failure: Secondary | ICD-10-CM

## 2023-04-16 ENCOUNTER — Ambulatory Visit (INDEPENDENT_AMBULATORY_CARE_PROVIDER_SITE_OTHER): Payer: No Typology Code available for payment source

## 2023-04-16 DIAGNOSIS — I5022 Chronic systolic (congestive) heart failure: Secondary | ICD-10-CM

## 2023-04-17 ENCOUNTER — Ambulatory Visit: Payer: No Typology Code available for payment source | Admitting: Orthopedic Surgery

## 2023-04-17 LAB — CUP PACEART REMOTE DEVICE CHECK
Battery Remaining Longevity: 124 mo
Battery Voltage: 3.01 V
Brady Statistic RV Percent Paced: 0 %
Date Time Interrogation Session: 20240916061704
HighPow Impedance: 51 Ohm
Implantable Lead Connection Status: 753985
Implantable Lead Implant Date: 20230616
Implantable Lead Location: 753860
Implantable Lead Model: 6935
Implantable Pulse Generator Implant Date: 20230616
Lead Channel Impedance Value: 342 Ohm
Lead Channel Impedance Value: 475 Ohm
Lead Channel Pacing Threshold Amplitude: 0.625 V
Lead Channel Pacing Threshold Pulse Width: 0.4 ms
Lead Channel Sensing Intrinsic Amplitude: 16.125 mV
Lead Channel Sensing Intrinsic Amplitude: 16.125 mV
Lead Channel Setting Pacing Amplitude: 2 V
Lead Channel Setting Pacing Pulse Width: 0.4 ms
Lead Channel Setting Sensing Sensitivity: 0.3 mV
Zone Setting Status: 755011

## 2023-04-25 ENCOUNTER — Ambulatory Visit: Payer: No Typology Code available for payment source | Admitting: Urology

## 2023-04-25 NOTE — Progress Notes (Deleted)
Assessment: 1. Organic impotence   2. BPH with obstruction/lower urinary tract symptoms      Plan: Injection procedure reviewed with the patient. He will try TriMix 10/30/1 0.3 ml per injection at home and let me know results Return to office in 2 months  Chief Complaint:  No chief complaint on file.   History of Present Illness:  Mitchell [name] is a 52 y.o. male who is seen for further evaluation of BPH with lower urinary tract symptoms, left flank pain, and erectile dysfunction. He has a history of lower urinary tract symptoms including frequency and nocturia for years.  He associates this with his use of torsemide.  No dysuria.  He reported an episode of gross hematuria prior to his hospitalization in March 2024.  Urinalysis at that time showed >50 WBCs, 11-20 RBCs, and rare bacteria.  No culture was obtained.  No further episodes of gross hematuria.  He was previously on tamsulosin. IPSS = 9. PVR = 86 ml  He reported intermittent left flank pain for 6 months. His symptoms are positional and associated with certain movements. CT abdomen and pelvis without contrast from 10/23/2022 showed no renal or ureteral calculi, no renal mass or evidence of obstruction.  He has a history of erectile dysfunction.  He has been unable to achieve an adequate erection for intercourse.  He has previously tried sildenafil without improvement.  He has also tried a vacuum erection device with unsatisfactory results.  He has seen Dr. Benancio Deeds at [facility] urology in 2021 and was prescribed prostaglandin for intracavernosal injection.  He reported minimal results with the dose prescribed and did not follow-up after his visit in November 2021.  PSA 3/24:  1.4 with 24% free  At his visit on 01/02/23, he reported that his urinary symptoms were stable.  He had recently run out of tamsulosin.  He was not having any dysuria or gross hematuria. He had not seen any results with tadalafil 20 mg as needed.  He  continued to have trouble achieving and maintaining his erections. He underwent a trial injection of Trimix 10/30/1 0.2 milliliters on 01/10/2023.  He did not achieve a satisfactory erection in the office.  He was seen in July 2024 after he had tried an injection at home with 0.3 mL but noted bleeding and did not complete the entire injection.  He had not tried since that time. Injection procedure reviewed with the patient.   Portions of the above documentation were copied from a prior visit for review purposes only.   Past Medical History:  Past Medical History:  Diagnosis Date   Bipolar disorder (HCC)    CAD (coronary artery disease)    a. diffuse 3v CAD by cath 2019, medical therapy recommended.   Cataract    forming    Chronic systolic CHF (congestive heart failure) (HCC)    CVA (cerebral infarction)    No residual deficits   Depression    PTSD,    Diabetes mellitus    TYPE II; UNCONTROLLED BY HEMOGLOBIN A1c; STABLE AS  PER DISCHARGE   Headache(784.0)    Herpes simplex of male genitalia    History of colonic polyps    Hyperlipidemia    Hypertension    Myocardial infarction (HCC) 1987   (while playing football)   Obesity    OSA (obstructive sleep apnea)    repeat study 2018 without significant OSA   Pneumonia    Post-cardiac injury syndrome (HCC)    History of cardiac injury  from blunt trauma   Pulmonary hypertension (HCC)    a. moderately elevated PASP 07/2019.   PVCs (premature ventricular contractions)    Schizophrenia (HCC)    Goes to [facility]   Sleep apnea    Stroke [facility]) 2005   some left side weakness   Syncope    Recurrent, thought to be vasovagal. Also has h/o frequent PVCs.     Past Surgical History:  Past Surgical History:  Procedure Laterality Date   AMPUTATION Right 01/25/2021   Procedure: RIGHT BELOW KNEE AMPUTATION;  Surgeon: Nadara Mustard, MD;  Location: [facility] OR;  Service: Orthopedics;  Laterality: Right;   CARDIAC  CATHETERIZATION  12/19/2010   DIFFUSE NONOBSTRUCTIVE CAD; NONISCHEMIC CARDIOMYOPATHY; LEFT VENTRICULAR ANGIOGRAM WAS PERFORMED SECONDARY TO  ELEVATED LEFT VENTRICULAR FILLING PRESSURES   COLONOSCOPY     ~ age 60-23   COLONOSCOPY W/ POLYPECTOMY     I & D EXTREMITY Bilateral 10/24/2019   Procedure: IRRIGATION AND DEBRIDEMENT BILATERAL EXTREMITY WOUND ON FOOT;  Surgeon: Allena Napoleon, MD;  [facility];  Service: Plastics;  Laterality: Bilateral;   ICD IMPLANT N/A 01/13/2022   Procedure: ICD IMPLANT;  Surgeon: Marinus Maw, MD;  Location: [facility] INVASIVE CV LAB;  Service: Cardiovascular;  Laterality: N/A;   METATARSAL HEAD EXCISION Right 08/29/2018   Procedure: METATARSAL HEAD RESECTION;  Surgeon: Felecia Shelling, DPM;  [facility];  Service: Podiatry;  Laterality: Right;   METATARSAL OSTEOTOMY Right 08/29/2018   Procedure: SUB FIFTHE METATARSIA RIGHT FOOT;  Surgeon: Felecia Shelling, DPM;  [facility];  Service: Podiatry;  Laterality: Right;   MULTIPLE EXTRACTIONS WITH ALVEOLOPLASTY  01/27/2014   "all my teeth; 4 Quadrants of alveoloplasty   MULTIPLE EXTRACTIONS WITH ALVEOLOPLASTY N/A 01/27/2014   Procedure: EXTRACTION OF TEETH #'1, 2, 3, 4, 5, 6, 7, 8, 9, 10, 11, 12, 13, 14, 15, 16, 17, 20, 21, 22, 23, 24, 25, 26, 27, 28, 29, 31 and 32 WITH ALVEOLOPLASTY;  Surgeon: Charlynne Pander, DDS;  Location: [facility];  Service: Oral Surgery;  Laterality: N/A;   ORIF FINGER / THUMB FRACTURE Right    POLYPECTOMY     RIGHT HEART CATH N/A 01/27/2021   Procedure: RIGHT HEART CATH;  Surgeon: Dolores Patty, MD;  [facility] CV LAB;  Service: Cardiovascular;  Laterality: N/A;   RIGHT/LEFT HEART CATH AND CORONARY ANGIOGRAPHY N/A 09/26/2017   Procedure: RIGHT/LEFT HEART CATH AND CORONARY ANGIOGRAPHY;  Surgeon: Dolores Patty, MD;  [facility];  Service: Cardiovascular;  Laterality: N/A;   RIGHT/LEFT HEART CATH AND CORONARY ANGIOGRAPHY N/A 04/12/2021   Procedure:  RIGHT/LEFT HEART CATH AND CORONARY ANGIOGRAPHY;  Surgeon: Dolores Patty, MD;  [facility];  Service: Cardiovascular;  Laterality: N/A;   SKIN SPLIT GRAFT Bilateral 10/24/2019   Procedure: SKIN GRAFT SPLIT THICKNESS LEFT THIGH;  Surgeon: Allena Napoleon, MD;  [facility]: [facility];  Service: Plastics;  Laterality: Bilateral;   WOUND DEBRIDEMENT Right 08/29/2018   Procedure: Debridement of ulcer on right fifth metatarsal;  Surgeon: Felecia Shelling, DPM;  Location: [facility];  Service: Podiatry;  Laterality: Right;    Allergies:  Allergies  Allergen Reactions   Farxiga [Dapagliflozin] Other (See Comments)    Patient reports loss of consciousness   Nsaids Anaphylaxis and Rash    Able to tolerate aspirin    Trulicity [Dulaglutide] Nausea And Vomiting    Family History:  Family History  Problem Relation Age of  Onset   Heart disease Mother        MI   Heart failure Mother    Diabetes Mother        ALSO IN MOST OF HIS SIBLINGS; 2 UNLCES HAVE ALSO PASSED AWAY FROM DM   Cardiomyopathy Mother    Cancer - Ovarian Mother    Ovarian cancer Mother    Heart disease Father    Hypertension Father    Diabetes Father    Diabetes Sister    Diabetes Brother    Colon cancer Paternal Uncle    Colon cancer Paternal Uncle    Colon polyps Neg Hx    Esophageal cancer Neg Hx    Rectal cancer Neg Hx    Stomach cancer Neg Hx     Social History:  Social History   Tobacco Use   Smoking status: Never   Smokeless tobacco: Never  Vaping Use   Vaping status: Never Used  Substance Use Topics   Alcohol use: No   Drug use: No    ROS: Constitutional:  Negative for fever, chills, weight loss CV: Negative for chest pain, previous MI, hypertension Respiratory:  Negative for shortness of breath, wheezing, sleep apnea, frequent cough GI:  Negative for nausea, vomiting, bloody stool, GERD  Physical exam: There were no vitals taken for this visit. GENERAL APPEARANCE:  Well appearing, well  developed, well nourished, NAD HEENT:  Atraumatic, normocephalic, oropharynx clear NECK:  Supple without lymphadenopathy or thyromegaly ABDOMEN:  Soft, non-tender, no masses EXTREMITIES:  Moves all extremities well, without clubbing, cyanosis, or edema NEUROLOGIC:  Alert and oriented x 3, normal gait, CN II-XII grossly intact MENTAL STATUS:  appropriate BACK:  Non-tender to palpation, No CVAT SKIN:  Warm, dry, and intact  Results: None

## 2023-04-29 ENCOUNTER — Other Ambulatory Visit (HOSPITAL_COMMUNITY): Payer: Self-pay | Admitting: Family Medicine

## 2023-04-29 ENCOUNTER — Encounter (HOSPITAL_BASED_OUTPATIENT_CLINIC_OR_DEPARTMENT_OTHER): Payer: Self-pay

## 2023-04-29 ENCOUNTER — Emergency Department (HOSPITAL_BASED_OUTPATIENT_CLINIC_OR_DEPARTMENT_OTHER)
Admission: EM | Admit: 2023-04-29 | Discharge: 2023-04-29 | Disposition: A | Discharge status: Home / Self Care | Payer: No Typology Code available for payment source | Attending: Emergency Medicine | Admitting: Emergency Medicine

## 2023-04-29 ENCOUNTER — Other Ambulatory Visit: Payer: Self-pay

## 2023-04-29 DIAGNOSIS — Z7982 Long term (current) use of aspirin: Secondary | ICD-10-CM | POA: Diagnosis not present

## 2023-04-29 DIAGNOSIS — Z7984 Long term (current) use of oral hypoglycemic drugs: Secondary | ICD-10-CM | POA: Diagnosis not present

## 2023-04-29 DIAGNOSIS — Z794 Long term (current) use of insulin: Secondary | ICD-10-CM | POA: Insufficient documentation

## 2023-04-29 DIAGNOSIS — I10 Essential (primary) hypertension: Secondary | ICD-10-CM | POA: Diagnosis not present

## 2023-04-29 DIAGNOSIS — I5022 Chronic systolic (congestive) heart failure: Secondary | ICD-10-CM

## 2023-04-29 DIAGNOSIS — Z7902 Long term (current) use of antithrombotics/antiplatelets: Secondary | ICD-10-CM | POA: Diagnosis not present

## 2023-04-29 DIAGNOSIS — L03113 Cellulitis of right upper limb: Secondary | ICD-10-CM | POA: Insufficient documentation

## 2023-04-29 DIAGNOSIS — Z79899 Other long term (current) drug therapy: Secondary | ICD-10-CM | POA: Insufficient documentation

## 2023-04-29 DIAGNOSIS — E119 Type 2 diabetes mellitus without complications: Secondary | ICD-10-CM | POA: Insufficient documentation

## 2023-04-29 MED ORDER — DOXYCYCLINE HYCLATE 100 MG PO CAPS: 100.0000 mg | ORAL_CAPSULE | Freq: Two times a day (BID) | ORAL | 0 refills | Status: DC

## 2023-04-29 MED ORDER — DOXYCYCLINE HYCLATE 100 MG PO TABS
100.0000 mg | ORAL_TABLET | Freq: Once | ORAL | Status: AC
  Administered 2023-04-29: 100 mg via ORAL
  Filled 2023-04-29: qty 1

## 2023-04-29 NOTE — ED Provider Notes (Signed)
[place] EMERGENCY DEPARTMENT AT [facility] Provider Note   CSN: [id number] Arrival date & time: 04/29/23  [id number]     History  Chief Complaint  Patient presents with   Insect Bite    MACKENZIE LIA is a 52 y.o. male.  Patient is a 52 year old male with history of diabetes, hypertension, sleep apnea, right below the knee amputation.  Patient presenting today with complaints of redness, burning to the back of his right arm.  He is concerned he may have been bitten by something and that is getting infected.  This has been worsening over the past 3 days.  No fevers or chills.  The history is provided by the patient.       Home Medications Prior to Admission medications   Medication Sig Start Date End Date Taking? Authorizing Provider  Accu-Chek Softclix Lancets lancets Use to test blood sugars up to 4 times daily as needed. 12/08/21   Orland Mustard, MD  acetaminophen-codeine (TYLENOL #3) 300-30 MG tablet Take 1 tablet by mouth every 6 (six) hours as needed for moderate pain. 06/15/22   [name], Gwenlyn Found, MD  amitriptyline (ELAVIL) 10 MG tablet Take 1 tablet (10 mg total) by mouth at bedtime. 07/26/22   [name], Gwenlyn Found, MD  Ascorbic Acid (VITAMIN C PO) Take 1 tablet by mouth daily.    [provider]  aspirin 81 MG EC tablet Take 1 tablet (81 mg total) by mouth daily. 04/13/21   Danford, Earl Lites, MD  blood glucose meter kit and supplies Dispense based on patient and insurance preference. Use up to four times daily as directed. (FOR ICD-10 E10.9, E11.9). 06/15/22   [name], Gwenlyn Found, MD  Blood Glucose Monitoring Suppl (BLOOD GLUCOSE MONITOR SYSTEM) w/Device KIT Use to test blood sugar up to 4 times daily as needed. 12/08/21   Arby Barrette, MD  carvedilol (COREG) 6.25 MG tablet Take 1 tablet (6.25 mg total) by mouth 2 (two) times daily with a meal. 10/30/22   Bensimhon, [name], MD  Cholecalciferol (VITAMIN D-3 PO) Take 1 capsule by mouth daily.    [provider]  clopidogrel (PLAVIX) 75 MG tablet Take 1 tablet (75 mg total) by mouth daily. 07/26/22   Bensimhon, [name], MD  empagliflozin (JARDIANCE) 10 MG TABS tablet Take 1 tablet (10 mg total) by mouth daily before breakfast. 03/16/23   Shamleffer, Konrad Dolores, MD  gabapentin (NEURONTIN) 100 MG capsule Take 1 capsule (100 mg total) by mouth 2 (two) times daily. 07/26/22   [name], Gwenlyn Found, MD  glipiZIDE (GLUCOTROL) 5 MG tablet Take 1 tablet (5 mg total) by mouth 2 (two) times daily before a meal. 03/16/23   Shamleffer, Konrad Dolores, MD  glucose blood (ACCU-CHEK GUIDE) test strip Use to test blood sugars up to 4 times daily as needed. 12/08/21   Orland Mustard, MD  Insulin Glargine [facility]) 100 UNIT/ML Inject 36 Units into the skin daily. 03/16/23   Shamleffer, Konrad Dolores, MD  Insulin Pen Needle (BD PEN NEEDLE MICRO U/F) 32G X 6 MM MISC 1 Device by Does not apply route daily in the afternoon. 03/16/23   Shamleffer, Konrad Dolores, MD  methocarbamol (ROBAXIN) 500 MG tablet TAKE 1 TABLET BY MOUTH EVERY 6 HOURS AS NEEDED FOR MUSCLE SPASM 12/11/22   [name], Gwenlyn Found, MD  pantoprazole (PROTONIX) 40 MG tablet Take 1 tablet (40 mg total) by mouth daily as needed (acid reflux). 02/13/22   [name], Gwenlyn Found, MD  potassium chloride SA (KLOR-CON  M) 20 MEQ tablet TAKE 1 TABLET BY MOUTH EVERY OTHER DAY 02/12/23   Bensimhon, [name], MD  rosuvastatin (CRESTOR) 40 MG tablet Take 1 tablet (40 mg total) by mouth daily. 07/26/22   Bensimhon, [name], MD  sacubitril-valsartan (ENTRESTO) 49-51 MG Take 1 tablet by mouth 2 (two) times daily. 12/20/22   [name], Gwenlyn Found, MD  spironolactone (ALDACTONE) 25 MG tablet Take 0.5 tablets (12.5 mg total) by mouth daily. 10/27/22   Albertine Grates, MD  tadalafil (CIALIS) 20 MG tablet Take 1 tablet (20 mg total) by mouth daily as needed for erectile dysfunction. 11/21/22   Stoneking, Danford Bad., MD  tamsulosin (FLOMAX) 0.4 MG CAPS capsule Take 1 capsule (0.4 mg  total) by mouth daily after supper. 01/02/23   Stoneking, Danford Bad., MD  torsemide (DEMADEX) 20 MG tablet Take 1 tablet (20 mg total) by mouth daily. 10/28/22   Albertine Grates, MD      Allergies    Marcelline Deist [dapagliflozin], Nsaids, and Trulicity [dulaglutide]    Review of Systems   Review of Systems  All other systems reviewed and are negative.   Physical Exam Updated Vital Signs BP (!) 148/88   Pulse 85   Temp 98.3 F (36.8 C)   Resp 18   Ht 5\' 11"  (1.803 m)   Wt 135.6 kg   SpO2 97%   BMI 41.69 kg/m  Physical Exam Vitals and nursing note reviewed.  Constitutional:      Appearance: Normal appearance.  HENT:     Head: Normocephalic.  Pulmonary:     Effort: Pulmonary effort is normal.  Skin:    General: Skin is warm and dry.     Comments: On the back of the right upper arm is an area of induration with surrounding erythema and warmth.  Neurological:     Mental Status: He is alert.     ED Results / Procedures / Treatments   Labs (all labs ordered are listed, but only abnormal results are displayed) Labs Reviewed - No data to display  EKG None  Radiology No results found.  Procedures Procedures    Medications Ordered in ED Medications  doxycycline (VIBRA-TABS) tablet 100 mg (has no administration in time range)    ED Course/ Medical Decision Making/ A&P  The area is concerning for cellulitis.  Patient to be treated with doxycycline and follow-up as needed.  Final Clinical Impression(s) / ED Diagnoses Final diagnoses:  None    Rx / DC Orders ED Discharge Orders     None         Geoffery Lyons, MD 04/29/23 [phone]

## 2023-04-29 NOTE — ED Triage Notes (Signed)
POV from home, A&O x 4, gcs 15, bib wheelchair  Pt c/o insect bite to right upper arm x 3 days, itchy, swollen and red.

## 2023-04-29 NOTE — Discharge Instructions (Signed)
Begin taking doxycycline as prescribed.  Follow-up with primary doctor if not improving in the next few days, and return to the ER if symptoms significantly worsen or change.

## 2023-04-30 ENCOUNTER — Telehealth (HOSPITAL_COMMUNITY): Payer: Self-pay | Admitting: Licensed Clinical Social Worker

## 2023-04-30 NOTE — Telephone Encounter (Signed)
H&V Care Navigation CSW Progress Note  Clinical Social Worker received call from pt requesting help getting torsemide.  States he talked to our staff and was told we could not refill.  CSW reviewed chart and refill was sent in today but did state he needed an appt to get further refills.  CSW explained this and where refill was sent.  Connected patient to front desk to get on schedule for appt.   SDOH Screenings   Food Insecurity: No Food Insecurity (04/13/2021)  Housing: Low Risk  (04/13/2021)  Transportation Needs: No Transportation Needs (04/13/2021)  Utilities: Not At Risk (10/27/2022)  Alcohol Screen: Low Risk  (05/15/2018)  Depression (PHQ2-9): Low Risk  (07/26/2022)  Financial Resource Strain: Medium Risk (04/13/2021)  Tobacco Use: Low Risk  (04/29/2023)    [name], LCSW Clinical Social Worker [facility] Desk#: [phone] Cell#: [phone]

## 2023-04-30 NOTE — Progress Notes (Signed)
Remote ICD transmission.   

## 2023-05-13 NOTE — Progress Notes (Addendum)
[facility] [address] [place], [place] [address] [phone]  Date:  05/16/2023   Name:  Mitchell [name]   DOB:  03/30/1971   MRN:  [id number]  PCP:  Pearline Cables, MD    Chief Complaint: Hospitalization Follow-up   History of Present Illness:  Mitchell [name] is a 52 y.o. very pleasant male patient who presents with the following:  Patient seen today for follow-up from recent ER visit Last seen by myself in June for diabetes follow-up History of CVA, schizophrenia versus bipolar disorder, OSA, obesity, CAD, HTN, hyperlipidemia, DM with history of right BKA, CRI  Noted renal insufficiency- pt notes he does have nephrology care at [facility].  At this time they are seeing in about 1 cm He was seen in the ER on 9/29 with possible cellulitis of his right upper arm, treated with doxycycline  Eye exam- pt notes this was done [facility]  COVID-19 booster Can update A1c today  Due for urine micro  Pt notes he just took his BP meds-this may be why his blood pressure is a bit elevated  His arm is now fine- he finished his abx He notes he is feeling fine ut his voice is hoarse this am- but no other sx, he otherwise feels fine  He has a cardiology appt coming up  Flu shot is done   Lab Results  Component Value Date   HGBA1C 8.3 (H) 01/22/2023    Patient Active Problem List   Diagnosis Date Noted   History of right below knee amputation (HCC) 03/16/2023   Diabetes mellitus (HCC) 03/16/2023   BPH with obstruction/lower urinary tract symptoms 11/21/2022   UTI (urinary tract infection) 10/23/2022   ICD (implantable cardioverter-defibrillator) in place 04/19/2022   Near syncope 12/08/2021   CKD (chronic kidney disease) stage 3, GFR 30-59 ml/min (HCC) 12/08/2021   PVD s/p R LE amputation  12/08/2021   Diabetic retinopathy associated with type 2 diabetes mellitus (HCC) 06/06/2021   CAD (coronary artery  disease) with flat troponin  04/11/2021   S/P below knee amputation, right (HCC) 02/07/2021   AKI (acute kidney injury) (HCC) 01/23/2021   QT prolongation 07/30/2019   Acute on chronic combined systolic and diastolic CHF (congestive heart failure) (HCC) 07/29/2019   Stroke (HCC) 05/08/2017   Uncontrolled type 2 diabetes mellitus with hyperglycemia, with long-term current use of insulin (HCC) 12/07/2015   Chronic systolic heart failure (HCC) 10/02/2011   Organic impotence 10/02/2011   Obstructive sleep apnea 10/11/2007   HLD (hyperlipidemia) 10/10/2007   HYPOKALEMIA 10/10/2007   Obesity, Class III, BMI 40-49.9 (morbid obesity) (HCC) 10/10/2007   Essential hypertension 10/10/2007   PREMATURE VENTRICULAR CONTRACTIONS 10/10/2007   History of colonic polyps 10/10/2007    Past Medical History:  Diagnosis Date   Bipolar disorder (HCC)    CAD (coronary artery disease)    a. diffuse 3v CAD by cath 2019, medical therapy recommended.   Cataract    forming    Chronic systolic CHF (congestive heart failure) (HCC)    CVA (cerebral infarction)    No residual deficits   Depression    PTSD,    Diabetes mellitus    TYPE II; UNCONTROLLED BY HEMOGLOBIN A1c; STABLE AS  PER DISCHARGE   Headache(784.0)    Herpes simplex of male genitalia    History of colonic polyps    Hyperlipidemia    Hypertension    Myocardial  infarction (HCC) 1987   (while playing football)   Obesity    OSA (obstructive sleep apnea)    repeat study 2018 without significant OSA   Pneumonia    Post-cardiac injury syndrome (HCC)    History of cardiac injury from blunt trauma   Pulmonary hypertension (HCC)    a. moderately elevated PASP 07/2019.   PVCs (premature ventricular contractions)    Schizophrenia (HCC)    Goes to [facility]   Sleep apnea    Stroke [facility]) 2005   some left side weakness   Syncope    Recurrent, thought to be vasovagal. Also has h/o frequent PVCs.     Past Surgical History:   Procedure Laterality Date   AMPUTATION Right 01/25/2021   Procedure: RIGHT BELOW KNEE AMPUTATION;  Surgeon: Nadara Mustard, MD;  Location: [facility] OR;  Service: Orthopedics;  Laterality: Right;   CARDIAC CATHETERIZATION  12/19/2010   DIFFUSE NONOBSTRUCTIVE CAD; NONISCHEMIC CARDIOMYOPATHY; LEFT VENTRICULAR ANGIOGRAM WAS PERFORMED SECONDARY TO  ELEVATED LEFT VENTRICULAR FILLING PRESSURES   COLONOSCOPY     ~ age 50-21   COLONOSCOPY W/ POLYPECTOMY     I & D EXTREMITY Bilateral 10/24/2019   Procedure: IRRIGATION AND DEBRIDEMENT BILATERAL EXTREMITY WOUND ON FOOT;  Surgeon: Allena Napoleon, MD;  Location: [facility] OR;  Service: Plastics;  Laterality: Bilateral;   ICD IMPLANT N/A 01/13/2022   Procedure: ICD IMPLANT;  Surgeon: Marinus Maw, MD;  Location: [facility] INVASIVE CV LAB;  Service: Cardiovascular;  Laterality: N/A;   METATARSAL HEAD EXCISION Right 08/29/2018   Procedure: METATARSAL HEAD RESECTION;  Surgeon: Felecia Shelling, DPM;  Location: [facility] OR;  Service: Podiatry;  Laterality: Right;   METATARSAL OSTEOTOMY Right 08/29/2018   Procedure: SUB FIFTHE METATARSIA RIGHT FOOT;  Surgeon: Felecia Shelling, DPM;  Location: [facility] OR;  Service: Podiatry;  Laterality: Right;   MULTIPLE EXTRACTIONS WITH ALVEOLOPLASTY  01/27/2014   "all my teeth; 4 Quadrants of alveoloplasty   MULTIPLE EXTRACTIONS WITH ALVEOLOPLASTY N/A 01/27/2014   Procedure: EXTRACTION OF TEETH #'1, 2, 3, 4, 5, 6, 7, 8, 9, 10, 11, 12, 13, 14, 15, 16, 17, 20, 21, 22, 23, 24, 25, 26, 27, 28, 29, 31 and 32 WITH ALVEOLOPLASTY;  Surgeon: Charlynne Pander, DDS;  Location: [facility] OR;  Service: Oral Surgery;  Laterality: N/A;   ORIF FINGER / THUMB FRACTURE Right    POLYPECTOMY     RIGHT HEART CATH N/A 01/27/2021   Procedure: RIGHT HEART CATH;  Surgeon: Dolores Patty, MD;  Location: [facility] INVASIVE CV LAB;  Service: Cardiovascular;  Laterality: N/A;   RIGHT/LEFT HEART CATH AND CORONARY ANGIOGRAPHY N/A 09/26/2017   Procedure: RIGHT/LEFT HEART CATH AND CORONARY  ANGIOGRAPHY;  Surgeon: Dolores Patty, MD;  Location: [facility] INVASIVE CV LAB;  Service: Cardiovascular;  Laterality: N/A;   RIGHT/LEFT HEART CATH AND CORONARY ANGIOGRAPHY N/A 04/12/2021   Procedure: RIGHT/LEFT HEART CATH AND CORONARY ANGIOGRAPHY;  Surgeon: Dolores Patty, MD;  Location: [facility] INVASIVE CV LAB;  Service: Cardiovascular;  Laterality: N/A;   SKIN SPLIT GRAFT Bilateral 10/24/2019   Procedure: SKIN GRAFT SPLIT THICKNESS LEFT THIGH;  Surgeon: Allena Napoleon, MD;  Location: [facility] OR;  Service: Plastics;  Laterality: Bilateral;   WOUND DEBRIDEMENT Right 08/29/2018   Procedure: Debridement of ulcer on right fifth metatarsal;  Surgeon: Felecia Shelling, DPM;  Location: [facility] OR;  Service: Podiatry;  Laterality: Right;    Social History   Tobacco Use   Smoking status: Never   Smokeless tobacco:  Never  Vaping Use   Vaping status: Never Used  Substance Use Topics   Alcohol use: No   Drug use: No    Family History  Problem Relation Age of Onset   Heart disease Mother        MI   Heart failure Mother    Diabetes Mother        ALSO IN MOST OF HIS SIBLINGS; 2 UNLCES HAVE ALSO PASSED AWAY FROM DM   Cardiomyopathy Mother    Cancer - Ovarian Mother    Ovarian cancer Mother    Heart disease Father    Hypertension Father    Diabetes Father    Diabetes Sister    Diabetes Brother    Colon cancer Paternal Uncle    Colon cancer Paternal Uncle    Colon polyps Neg Hx    Esophageal cancer Neg Hx    Rectal cancer Neg Hx    Stomach cancer Neg Hx     Allergies  Allergen Reactions   Farxiga [Dapagliflozin] Other (See Comments)    Patient reports loss of consciousness   Nsaids Anaphylaxis and Rash    Able to tolerate aspirin    Trulicity [Dulaglutide] Nausea And Vomiting    Medication list has been reviewed and updated.  Current Outpatient Medications on File Prior to Visit  Medication Sig Dispense Refill   Accu-Chek Softclix Lancets lancets Use to test blood sugars up to 4 times  daily as needed. 100 each 0   acetaminophen-codeine (TYLENOL #3) 300-30 MG tablet Take 1 tablet by mouth every 6 (six) hours as needed for moderate pain. 30 tablet 0   amitriptyline (ELAVIL) 10 MG tablet Take 1 tablet (10 mg total) by mouth at bedtime. 90 tablet 2   Ascorbic Acid (VITAMIN C PO) Take 1 tablet by mouth daily.     aspirin 81 MG EC tablet Take 1 tablet (81 mg total) by mouth daily. 30 tablet 12   blood glucose meter kit and supplies Dispense based on patient and insurance preference. Use up to four times daily as directed. (FOR ICD-10 E10.9, E11.9). 1 each 0   Blood Glucose Monitoring Suppl (BLOOD GLUCOSE MONITOR SYSTEM) w/Device KIT Use to test blood sugar up to 4 times daily as needed. 1 kit 0   carvedilol (COREG) 6.25 MG tablet Take 1 tablet (6.25 mg total) by mouth 2 (two) times daily with a meal. 180 tablet 3   Cholecalciferol (VITAMIN D-3 PO) Take 1 capsule by mouth daily.     clopidogrel (PLAVIX) 75 MG tablet Take 1 tablet (75 mg total) by mouth daily. 90 tablet 3   doxycycline (VIBRAMYCIN) 100 MG capsule Take 1 capsule (100 mg total) by mouth 2 (two) times daily. One po bid x 7 days 14 capsule 0   empagliflozin (JARDIANCE) 10 MG TABS tablet Take 1 tablet (10 mg total) by mouth daily before breakfast. 90 tablet 1   gabapentin (NEURONTIN) 100 MG capsule Take 1 capsule (100 mg total) by mouth 2 (two) times daily. 60 capsule 3   glipiZIDE (GLUCOTROL) 5 MG tablet Take 1 tablet (5 mg total) by mouth 2 (two) times daily before a meal. 180 tablet 3   glucose blood (ACCU-CHEK GUIDE) test strip Use to test blood sugars up to 4 times daily as needed. 100 each 0   Insulin Glargine (BASAGLAR KWIKPEN) 100 UNIT/ML Inject 36 Units into the skin daily. 45 mL 3   Insulin Pen Needle (BD PEN NEEDLE MICRO U/F) 32G X 6 MM  MISC 1 Device by Does not apply route daily in the afternoon. 100 each 3   methocarbamol (ROBAXIN) 500 MG tablet TAKE 1 TABLET BY MOUTH EVERY 6 HOURS AS NEEDED FOR MUSCLE SPASM 30  tablet 1   pantoprazole (PROTONIX) 40 MG tablet Take 1 tablet (40 mg total) by mouth daily as needed (acid reflux). 30 tablet 6   potassium chloride SA (KLOR-CON M) 20 MEQ tablet TAKE 1 TABLET BY MOUTH EVERY OTHER DAY 45 tablet 7   rosuvastatin (CRESTOR) 40 MG tablet Take 1 tablet (40 mg total) by mouth daily. 90 tablet 3   sacubitril-valsartan (ENTRESTO) 49-51 MG Take 1 tablet by mouth 2 (two) times daily. 180 tablet 3   spironolactone (ALDACTONE) 25 MG tablet Take 0.5 tablets (12.5 mg total) by mouth daily. 90 tablet 3   tadalafil (CIALIS) 20 MG tablet Take 1 tablet (20 mg total) by mouth daily as needed for erectile dysfunction. 10 tablet 11   tamsulosin (FLOMAX) 0.4 MG CAPS capsule Take 1 capsule (0.4 mg total) by mouth daily after supper. 30 capsule 11   torsemide (DEMADEX) 20 MG tablet Take 3 tablets (60 mg total) by mouth daily. NEEDS FOLLOW UP APPOINTMENT FOR MORE REFILLS 90 tablet 0   No current facility-administered medications on file prior to visit.    Review of Systems:  As per HPI- otherwise negative.   Physical Examination: Vitals:   05/16/23 1610 05/16/23 1617  BP: (!) 142/86 110/70  Pulse: 88   SpO2: 94%    Vitals:   05/16/23 1610  Weight: 299 lb (135.6 kg)  Height: 5\' 11"  (1.803 m)   Body mass index is 41.7 kg/m. Ideal Body Weight: Weight in (lb) to have BMI = 25: 178.9  GEN: no acute distress.  Obese, seated in wheelchair.  Status post right BKA HEENT: Atraumatic, Normocephalic.  Ears and Nose: No external deformity. CV: RRR, No M/G/R. No JVD. No thrill. No extra heart sounds. PULM: CTA B, no wheezes, crackles, rhonchi. No retractions. No resp. distress. No accessory muscle use. EXTR: No c/c/e PSYCH: Normally interactive. Conversant.  No evidence of active cellulitis on right upper extremity  Assessment and Plan: Type 2 diabetes mellitus with other specified complication, with long-term current use of insulin (HCC) - Plan: Hemoglobin A1c, POCT glucose  (manual entry)  Hx of right BKA [facility])  Patient seen today for follow-up.  A1c pending to evaluate diabetes control If hoarse voice does not resolve he will let me know Results for orders placed or performed in visit on 05/16/23  POCT glucose (manual entry)  Result Value Ref Range   POC Glucose 189 (A) 70 - 99 mg/dl   *Note: Due to a large number of results and/or encounters for the requested time period, some results have not been displayed. A complete set of results can be found in Results Review.     Signed Abbe Amsterdam, MD  Received labs 10/19- letter to pt as he does not have mychart Will forward to endocrinology   Results for orders placed or performed in visit on 05/16/23  Hemoglobin A1c  Result Value Ref Range   Hgb A1c MFr Bld 8.4 (H) 4.6 - 6.5 %  POCT glucose (manual entry)  Result Value Ref Range   POC Glucose 189 (A) 70 - 99 mg/dl   *Note: Due to a large number of results and/or encounters for the requested time period, some results have not been displayed. A complete set of results can be found in Results Review.

## 2023-05-16 ENCOUNTER — Ambulatory Visit (INDEPENDENT_AMBULATORY_CARE_PROVIDER_SITE_OTHER): Payer: No Typology Code available for payment source | Admitting: Family Medicine

## 2023-05-16 VITALS — BP 110/70 | HR 88 | Ht 71.0 in | Wt 299.0 lb

## 2023-05-16 DIAGNOSIS — E1169 Type 2 diabetes mellitus with other specified complication: Secondary | ICD-10-CM | POA: Diagnosis not present

## 2023-05-16 DIAGNOSIS — Z794 Long term (current) use of insulin: Secondary | ICD-10-CM

## 2023-05-16 DIAGNOSIS — Z89511 Acquired absence of right leg below knee: Secondary | ICD-10-CM | POA: Diagnosis not present

## 2023-05-16 LAB — GLUCOSE, POCT (MANUAL RESULT ENTRY): POC Glucose: 189 mg/dL — AB (ref 70–99)

## 2023-05-16 NOTE — Patient Instructions (Signed)
Good to see you today- - will be in touch with your labs asap  Assuming all is well please see me in about 4 months

## 2023-05-17 LAB — HEMOGLOBIN A1C: Hgb A1c MFr Bld: 8.4 % — ABNORMAL HIGH (ref 4.6–6.5)

## 2023-05-21 ENCOUNTER — Telehealth: Payer: Self-pay | Admitting: Internal Medicine

## 2023-05-21 MED ORDER — EMPAGLIFLOZIN 25 MG PO TABS: 25.0000 mg | ORAL_TABLET | Freq: Every day | ORAL | 3 refills | Status: DC

## 2023-05-21 NOTE — Telephone Encounter (Signed)
-----   Message from Myton [name] sent at 05/19/2023 10:00 AM EDT ----- Hi Abby- I got an A1c for Mitchell [name] when he was in last time.  Do you want me to make any adjustment to his medications or just wait until he sees you next?  Thank you!

## 2023-05-22 ENCOUNTER — Telehealth: Payer: Self-pay

## 2023-05-22 NOTE — Telephone Encounter (Signed)
Patient made aware of increase in A1C and given the advisement of increasing Jardiance dosage to 25mg . No further  questions at this time.

## 2023-05-22 NOTE — Telephone Encounter (Signed)
-----   Message from Johnney Ou [facility] sent at 05/21/2023  4:00 PM EDT ----- Please let the patient know that his most recent A1c has increased to his PCPs office.   I recommend increasing Jardiance to 25 mg daily, the rest of his glycemic agents remain the same for now ----- Message ----- From: Pearline Cables, MD Sent: 05/19/2023  10:00 AM EDT To: Orland Penman, MD  Hi Abby- I got an A1c for Young when he was in last time.  Do you want me to make any adjustment to his medications or just wait until he sees you next?  Thank you!

## 2023-05-28 ENCOUNTER — Telehealth (HOSPITAL_COMMUNITY): Payer: Self-pay
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
## 2023-05-28 NOTE — Telephone Encounter (Signed)
Called to confirm/remind patient of their appointment at the [facility] Clinic on 05/29/23.   Patient reminded to bring all medications and/or complete list.  Confirmed patient has transportation. Gave directions, instructed to utilize valet parking.  Confirmed appointment prior to ending call.

## 2023-05-29 ENCOUNTER — Ambulatory Visit (HOSPITAL_COMMUNITY)
Admission: RE | Admit: 2023-05-29 | Discharge: 2023-05-29 | Disposition: A | Discharge status: Home / Self Care | Payer: No Typology Code available for payment source | Source: Ambulatory Visit | Attending: Internal Medicine | Admitting: Internal Medicine

## 2023-05-29 ENCOUNTER — Ambulatory Visit (HOSPITAL_BASED_OUTPATIENT_CLINIC_OR_DEPARTMENT_OTHER)
Admission: RE | Admit: 2023-05-29 | Discharge: 2023-05-29 | Disposition: A | Discharge status: Home / Self Care | Payer: No Typology Code available for payment source | Source: Ambulatory Visit | Attending: Family Medicine | Admitting: Family Medicine

## 2023-05-29 ENCOUNTER — Encounter (HOSPITAL_COMMUNITY): Payer: Self-pay

## 2023-05-29 VITALS — BP 130/84 | HR 80 | Wt 290.6 lb

## 2023-05-29 DIAGNOSIS — I10 Essential (primary) hypertension: Secondary | ICD-10-CM | POA: Diagnosis not present

## 2023-05-29 DIAGNOSIS — Z139 Encounter for screening, unspecified: Secondary | ICD-10-CM

## 2023-05-29 DIAGNOSIS — F319 Bipolar disorder, unspecified: Secondary | ICD-10-CM | POA: Insufficient documentation

## 2023-05-29 DIAGNOSIS — E785 Hyperlipidemia, unspecified: Secondary | ICD-10-CM | POA: Diagnosis not present

## 2023-05-29 DIAGNOSIS — I252 Old myocardial infarction: Secondary | ICD-10-CM | POA: Insufficient documentation

## 2023-05-29 DIAGNOSIS — I272 Pulmonary hypertension, unspecified: Secondary | ICD-10-CM | POA: Diagnosis not present

## 2023-05-29 DIAGNOSIS — E1136 Type 2 diabetes mellitus with diabetic cataract: Secondary | ICD-10-CM | POA: Diagnosis not present

## 2023-05-29 DIAGNOSIS — Z950 Presence of cardiac pacemaker: Secondary | ICD-10-CM | POA: Insufficient documentation

## 2023-05-29 DIAGNOSIS — Z7984 Long term (current) use of oral hypoglycemic drugs: Secondary | ICD-10-CM | POA: Insufficient documentation

## 2023-05-29 DIAGNOSIS — F209 Schizophrenia, unspecified: Secondary | ICD-10-CM | POA: Diagnosis not present

## 2023-05-29 DIAGNOSIS — Z794 Long term (current) use of insulin: Secondary | ICD-10-CM

## 2023-05-29 DIAGNOSIS — Z79899 Other long term (current) drug therapy: Secondary | ICD-10-CM | POA: Diagnosis not present

## 2023-05-29 DIAGNOSIS — I5022 Chronic systolic (congestive) heart failure: Secondary | ICD-10-CM | POA: Diagnosis present

## 2023-05-29 DIAGNOSIS — I214 Non-ST elevation (NSTEMI) myocardial infarction: Secondary | ICD-10-CM | POA: Insufficient documentation

## 2023-05-29 DIAGNOSIS — Z5986 Financial insecurity: Secondary | ICD-10-CM | POA: Insufficient documentation

## 2023-05-29 DIAGNOSIS — R197 Diarrhea, unspecified: Secondary | ICD-10-CM | POA: Insufficient documentation

## 2023-05-29 DIAGNOSIS — Z8673 Personal history of transient ischemic attack (TIA), and cerebral infarction without residual deficits: Secondary | ICD-10-CM | POA: Insufficient documentation

## 2023-05-29 DIAGNOSIS — G4733 Obstructive sleep apnea (adult) (pediatric): Secondary | ICD-10-CM | POA: Diagnosis not present

## 2023-05-29 DIAGNOSIS — I2510 Atherosclerotic heart disease of native coronary artery without angina pectoris: Secondary | ICD-10-CM

## 2023-05-29 DIAGNOSIS — I493 Ventricular premature depolarization: Secondary | ICD-10-CM | POA: Insufficient documentation

## 2023-05-29 DIAGNOSIS — E782 Mixed hyperlipidemia: Secondary | ICD-10-CM

## 2023-05-29 DIAGNOSIS — E1151 Type 2 diabetes mellitus with diabetic peripheral angiopathy without gangrene: Secondary | ICD-10-CM | POA: Insufficient documentation

## 2023-05-29 DIAGNOSIS — E669 Obesity, unspecified: Secondary | ICD-10-CM | POA: Insufficient documentation

## 2023-05-29 DIAGNOSIS — I739 Peripheral vascular disease, unspecified: Secondary | ICD-10-CM

## 2023-05-29 DIAGNOSIS — Z7902 Long term (current) use of antithrombotics/antiplatelets: Secondary | ICD-10-CM | POA: Insufficient documentation

## 2023-05-29 DIAGNOSIS — Z7982 Long term (current) use of aspirin: Secondary | ICD-10-CM | POA: Insufficient documentation

## 2023-05-29 DIAGNOSIS — I110 Hypertensive heart disease with heart failure: Secondary | ICD-10-CM | POA: Insufficient documentation

## 2023-05-29 DIAGNOSIS — I255 Ischemic cardiomyopathy: Secondary | ICD-10-CM | POA: Diagnosis not present

## 2023-05-29 DIAGNOSIS — H538 Other visual disturbances: Secondary | ICD-10-CM | POA: Insufficient documentation

## 2023-05-29 DIAGNOSIS — I428 Other cardiomyopathies: Secondary | ICD-10-CM | POA: Insufficient documentation

## 2023-05-29 DIAGNOSIS — Z89511 Acquired absence of right leg below knee: Secondary | ICD-10-CM | POA: Insufficient documentation

## 2023-05-29 DIAGNOSIS — E1169 Type 2 diabetes mellitus with other specified complication: Secondary | ICD-10-CM

## 2023-05-29 LAB — CBC
HCT: 42.5 % (ref 39.0–52.0)
Hemoglobin: 13.5 g/dL (ref 13.0–17.0)
MCH: 28.8 pg (ref 26.0–34.0)
MCHC: 31.8 g/dL (ref 30.0–36.0)
MCV: 90.8 fL (ref 80.0–100.0)
Platelets: 175 10*3/uL (ref 150–400)
RBC: 4.68 MIL/uL (ref 4.22–5.81)
RDW: 12.6 % (ref 11.5–15.5)
WBC: 9.8 10*3/uL (ref 4.0–10.5)
nRBC: 0 % (ref 0.0–0.2)

## 2023-05-29 LAB — BASIC METABOLIC PANEL
Anion gap: 9 (ref 5–15)
BUN: 26 mg/dL — ABNORMAL HIGH (ref 6–20)
CO2: 24 mmol/L (ref 22–32)
Calcium: 8.9 mg/dL (ref 8.9–10.3)
Chloride: 105 mmol/L (ref 98–111)
Creatinine, Ser: 1.67 mg/dL — ABNORMAL HIGH (ref 0.61–1.24)
GFR, Estimated: 48 mL/min — ABNORMAL LOW (ref >60)
Glucose, Bld: 148 mg/dL — ABNORMAL HIGH (ref 70–99)
Potassium: 3.8 mmol/L (ref 3.5–5.1)
Sodium: 138 mmol/L (ref 135–145)

## 2023-05-29 LAB — ECHOCARDIOGRAM COMPLETE: S' Lateral: 5.45 cm

## 2023-05-29 MED ORDER — PERFLUTREN LIPID MICROSPHERE
1.0000 mL | INTRAVENOUS | Status: DC | PRN
  Administered 2023-05-29: 4 mL via INTRAVENOUS

## 2023-05-29 NOTE — Patient Instructions (Signed)
Thank you for coming in today  If you had labs drawn today, any labs that are abnormal the clinic will call you No news is good news  Medications: No changes   Follow up appointments:  Your physician recommends that you schedule a follow-up appointment in:  6 months With Dr. Gala Romney You will receive a reminder letter in the mail a few months in advance. If you don't receive a letter, please call our office to schedule the follow-up appointment.    Do the following things EVERYDAY: Weigh yourself in the morning before breakfast. Write it down and keep it in a log. Take your medicines as prescribed Eat low salt foods--Limit salt (sodium) to 2000 mg per day.  Stay as active as you can everyday Limit all fluids for the day to less than 2 liters   At the [facility], you and your health needs are our priority. As part of our continuing mission to provide you with exceptional heart care, we have created designated Provider Care Teams. These Care Teams include your primary Cardiologist (physician) and Advanced Practice Providers (APPs- Physician Assistants and Nurse Practitioners) who all work together to provide you with the care you need, when you need it.   You may see any of the following providers on your designated Care Team at your next follow up: Dr Arvilla Meres Dr Marca Ancona Dr. Marcos Eke, NP Robbie Lis, [place] [facility] [place], [place] Brynda Peon, NP Karle Plumber, PharmD   Please be sure to bring in all your medications bottles to every appointment.    Thank you for choosing [facility]  If you have any questions or concerns before your next appointment please send [place] a message through [place] or call our office at [phone].    TO LEAVE A MESSAGE FOR THE NURSE SELECT OPTION 2, PLEASE LEAVE A MESSAGE INCLUDING: YOUR NAME DATE OF BIRTH CALL BACK NUMBER REASON FOR  CALL**this is important as we prioritize the call backs  YOU WILL RECEIVE A CALL BACK THE SAME DAY AS LONG AS YOU CALL BEFORE 4:00 PM

## 2023-06-08 ENCOUNTER — Encounter: Payer: Self-pay | Admitting: Internal Medicine

## 2023-06-10 ENCOUNTER — Emergency Department (HOSPITAL_BASED_OUTPATIENT_CLINIC_OR_DEPARTMENT_OTHER)
Admission: EM | Admit: 2023-06-10 | Discharge: 2023-06-11 | Disposition: A | Discharge status: Home / Self Care | Payer: No Typology Code available for payment source | Attending: Emergency Medicine | Admitting: Emergency Medicine

## 2023-06-10 ENCOUNTER — Encounter (HOSPITAL_BASED_OUTPATIENT_CLINIC_OR_DEPARTMENT_OTHER): Payer: Self-pay | Admitting: Emergency Medicine

## 2023-06-10 ENCOUNTER — Emergency Department (HOSPITAL_BASED_OUTPATIENT_CLINIC_OR_DEPARTMENT_OTHER): Payer: No Typology Code available for payment source | Admitting: Radiology

## 2023-06-10 ENCOUNTER — Other Ambulatory Visit: Payer: Self-pay

## 2023-06-10 DIAGNOSIS — Z7982 Long term (current) use of aspirin: Secondary | ICD-10-CM | POA: Insufficient documentation

## 2023-06-10 DIAGNOSIS — L84 Corns and callosities: Secondary | ICD-10-CM | POA: Insufficient documentation

## 2023-06-10 DIAGNOSIS — Z794 Long term (current) use of insulin: Secondary | ICD-10-CM | POA: Diagnosis not present

## 2023-06-10 DIAGNOSIS — Z7901 Long term (current) use of anticoagulants: Secondary | ICD-10-CM | POA: Diagnosis not present

## 2023-06-10 DIAGNOSIS — E119 Type 2 diabetes mellitus without complications: Secondary | ICD-10-CM | POA: Diagnosis not present

## 2023-06-10 DIAGNOSIS — Z4800 Encounter for change or removal of nonsurgical wound dressing: Secondary | ICD-10-CM | POA: Diagnosis present

## 2023-06-10 LAB — BASIC METABOLIC PANEL
Anion gap: 9 (ref 5–15)
BUN: 17 mg/dL (ref 6–20)
CO2: 27 mmol/L (ref 22–32)
Calcium: 9.5 mg/dL (ref 8.9–10.3)
Chloride: 105 mmol/L (ref 98–111)
Creatinine, Ser: 1.45 mg/dL — ABNORMAL HIGH (ref 0.61–1.24)
GFR, Estimated: 57 mL/min — ABNORMAL LOW (ref >60)
Glucose, Bld: 114 mg/dL — ABNORMAL HIGH (ref 70–99)
Potassium: 3.3 mmol/L — ABNORMAL LOW (ref 3.5–5.1)
Sodium: 141 mmol/L (ref 135–145)

## 2023-06-10 LAB — CBC WITH DIFFERENTIAL/PLATELET
Abs Immature Granulocytes: 0.03 10*3/uL (ref 0.00–0.07)
Basophils Absolute: 0 10*3/uL (ref 0.0–0.1)
Basophils Relative: 0 %
Eosinophils Absolute: 0.1 10*3/uL (ref 0.0–0.5)
Eosinophils Relative: 1 %
HCT: 39.7 % (ref 39.0–52.0)
Hemoglobin: 13.1 g/dL (ref 13.0–17.0)
Immature Granulocytes: 0 %
Lymphocytes Relative: 16 %
Lymphs Abs: 1.4 10*3/uL (ref 0.7–4.0)
MCH: 29.9 pg (ref 26.0–34.0)
MCHC: 33 g/dL (ref 30.0–36.0)
MCV: 90.6 fL (ref 80.0–100.0)
Monocytes Absolute: 0.5 10*3/uL (ref 0.1–1.0)
Monocytes Relative: 6 %
Neutro Abs: 6.6 10*3/uL (ref 1.7–7.7)
Neutrophils Relative %: 77 %
Platelets: 125 10*3/uL — ABNORMAL LOW (ref 150–400)
RBC: 4.38 MIL/uL (ref 4.22–5.81)
RDW: 12.9 % (ref 11.5–15.5)
WBC: 8.7 10*3/uL (ref 4.0–10.5)
nRBC: 0 % (ref 0.0–0.2)
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
## 2023-06-11 ENCOUNTER — Emergency Department (HOSPITAL_BASED_OUTPATIENT_CLINIC_OR_DEPARTMENT_OTHER): Payer: No Typology Code available for payment source | Admitting: Radiology

## 2023-06-11 LAB — SEDIMENTATION RATE: Sed Rate: 10 mm/h (ref 0–16)
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
## 2023-06-15 ENCOUNTER — Encounter: Payer: Self-pay | Admitting: Family

## 2023-06-15 ENCOUNTER — Ambulatory Visit (INDEPENDENT_AMBULATORY_CARE_PROVIDER_SITE_OTHER): Payer: No Typology Code available for payment source | Admitting: Family

## 2023-06-15 VITALS — BP 126/80 | HR 75 | Resp 18 | Ht 71.0 in | Wt 298.0 lb

## 2023-06-15 DIAGNOSIS — R197 Diarrhea, unspecified: Secondary | ICD-10-CM

## 2023-06-15 DIAGNOSIS — L84 Corns and callosities: Secondary | ICD-10-CM

## 2023-06-15 DIAGNOSIS — L97511 Non-pressure chronic ulcer of other part of right foot limited to breakdown of skin: Secondary | ICD-10-CM | POA: Diagnosis not present

## 2023-06-15 DIAGNOSIS — L97521 Non-pressure chronic ulcer of other part of left foot limited to breakdown of skin: Secondary | ICD-10-CM

## 2023-06-15 NOTE — Patient Instructions (Signed)
Please let Dr. Clarise Cruz now that you cannot tolerate the Jardiance at 25 mg;

## 2023-06-15 NOTE — Progress Notes (Signed)
Office Visit Note   Patient: Mitchell [name]           Date of Birth: September 03, 1970           MRN: [id number] Visit Date: 06/15/2023              Requested by: Pearline Cables, MD [address] [place],  [place] [address] PCP: Pearline Cables, MD  No chief complaint on file.     HPI: The patient is a 52 year old gentleman who presents today for evaluation of darkened discoloration and ulceration to his left second toe he noticed a black scab on the tip of the toe and was concerned.  Did have emergency department visit for the same on November 10.  At that time radiographs were performed these were negative for osteomyelitis.  He was not placed on a course of antibiotics either.  Reassurance was provided.  He denies any drainage no fever no chills  History of below-knee amputation on the right  Assessment & Plan: Visit Diagnoses:  1. Callus of toe   2. Foot ulcer, left, limited to breakdown of skin (HCC)   3. Ulcer of toe of right foot, limited to breakdown of skin (HCC)     Plan: Debrided with a 10 blade knife after informed consent.  This gust there is no sign of infection he will continue with close monitoring of his feet follow-up as needed  Follow-Up Instructions: Return if symptoms worsen or fail to improve.   Ortho Exam  Patient is alert, oriented, no adenopathy, well-dressed, normal affect, normal respiratory effort. On examination of the left foot there is some darkened discoloration which appears to be old hematoma to the tip of the second toe there is hypertrophic callus buildup this was debrided with a 10 blade knife back to viable tissue.  There is no underlying ulcer.  He does have thickened and discolored onychomycotic nails x 5 these were debrided trimmed today after informed consent.  Patient tolerated well.  Will follow-up as needed.  Imaging: No results found. No images are attached to the encounter.  Labs: Lab Results  Component Value  Date   HGBA1C 8.4 (H) 05/16/2023   HGBA1C 8.3 (H) 01/22/2023   HGBA1C 10.2 (H) 10/25/2022   ESRSEDRATE 10 06/10/2023   ESRSEDRATE 20 (H) 03/08/2022   ESRSEDRATE 81 (H) 10/21/2019   CRP 10.3 (H) 01/28/2021   CRP 12.8 (H) 01/27/2021   CRP 21.1 (H) 01/26/2021   LABURIC 10.9 (H) 10/23/2022   REPTSTATUS 01/25/2023 FINAL 01/20/2023   GRAMSTAIN NO WBC SEEN NO ORGANISMS SEEN  01/20/2023   CULT  01/20/2023    RARE STAPHYLOCOCCUS CAPITIS NO ANAEROBES ISOLATED Performed at [facility], [address] [place], [place] [address]    [facility] [facility] STAPHYLOCOCCUS CAPITIS 01/20/2023     Lab Results  Component Value Date   ALBUMIN 4.1 01/22/2023   ALBUMIN 4.4 11/08/2022   ALBUMIN 3.6 10/23/2022   PREALBUMIN 6.8 (L) 10/21/2019    Lab Results  Component Value Date   MG 2.3 11/29/2021   MG 1.5 (L) 04/12/2021   MG 1.8 02/16/2021   Lab Results  Component Value Date   VD25OH 11 (L) 08/16/2009    Lab Results  Component Value Date   PREALBUMIN 6.8 (L) 10/21/2019      Latest Ref Rng & Units 06/10/2023    9:47 PM 05/29/2023    9:58 AM 01/22/2023   10:58 AM  CBC EXTENDED  WBC  4.0 - 10.5 K/uL 8.7  9.8  8.1   RBC 4.22 - 5.81 MIL/uL 4.38  4.68  4.55   Hemoglobin 13.0 - 17.0 g/dL 40.9  81.1  91.4   HCT 39.0 - 52.0 % 39.7  42.5  41.9   Platelets 150 - 400 K/uL 125  175  149.0   NEUT# 1.7 - 7.7 K/uL 6.6     Lymph# 0.7 - 4.0 K/uL 1.4        There is no height or weight on file to calculate BMI.  Orders:  No orders of the defined types were placed in this encounter.  No orders of the defined types were placed in this encounter.    Procedures: No procedures performed  Clinical Data: No additional findings.  ROS:  All other systems negative, except as noted in the HPI. Review of Systems  Objective: Vital Signs: There were no vitals taken for this visit.  Specialty Comments:  No specialty comments available.  PMFS History: Patient Active Problem List   Diagnosis  Date Noted   History of right below knee amputation (HCC) 03/16/2023   Diabetes mellitus (HCC) 03/16/2023   BPH with obstruction/lower urinary tract symptoms 11/21/2022   UTI (urinary tract infection) 10/23/2022   ICD (implantable cardioverter-defibrillator) in place 04/19/2022   Near syncope 12/08/2021   CKD (chronic kidney disease) stage 3, GFR 30-59 ml/min (HCC) 12/08/2021   PVD s/p R LE amputation  12/08/2021   Diabetic retinopathy associated with type 2 diabetes mellitus (HCC) 06/06/2021   CAD (coronary artery disease) with flat troponin  04/11/2021   S/P below knee amputation, right (HCC) 02/07/2021   AKI (acute kidney injury) (HCC) 01/23/2021   QT prolongation 07/30/2019   Acute on chronic combined systolic and diastolic CHF (congestive heart failure) (HCC) 07/29/2019   Stroke (HCC) 05/08/2017   Uncontrolled type 2 diabetes mellitus with hyperglycemia, with long-term current use of insulin (HCC) 12/07/2015   Chronic systolic heart failure (HCC) 10/02/2011   Organic impotence 10/02/2011   Obstructive sleep apnea 10/11/2007   HLD (hyperlipidemia) 10/10/2007   HYPOKALEMIA 10/10/2007   Obesity, Class III, BMI 40-49.9 (morbid obesity) (HCC) 10/10/2007   Essential hypertension 10/10/2007   PREMATURE VENTRICULAR CONTRACTIONS 10/10/2007   History of colonic polyps 10/10/2007   Past Medical History:  Diagnosis Date   Bipolar disorder (HCC)    CAD (coronary artery disease)    a. diffuse 3v CAD by cath 2019, medical therapy recommended.   Cataract    forming    Chronic systolic CHF (congestive heart failure) (HCC)    CVA (cerebral infarction)    No residual deficits   Depression    PTSD,    Diabetes mellitus    TYPE II; UNCONTROLLED BY HEMOGLOBIN A1c; STABLE AS  PER DISCHARGE   Headache(784.0)    Herpes simplex of male genitalia    History of colonic polyps    Hyperlipidemia    Hypertension    Myocardial infarction (HCC) 1987   (while playing football)   Obesity    OSA  (obstructive sleep apnea)    repeat study 2018 without significant OSA   Pneumonia    Post-cardiac injury syndrome (HCC)    History of cardiac injury from blunt trauma   Pulmonary hypertension (HCC)    a. moderately elevated PASP 07/2019.   PVCs (premature ventricular contractions)    Schizophrenia (HCC)    Goes to [facility]   Sleep apnea    Stroke [facility]) 2005   some left  side weakness   Syncope    Recurrent, thought to be vasovagal. Also has h/o frequent PVCs.     Family History  Problem Relation Age of Onset   Heart disease Mother        MI   Heart failure Mother    Diabetes Mother        ALSO IN MOST OF HIS SIBLINGS; 2 UNLCES HAVE ALSO PASSED AWAY FROM DM   Cardiomyopathy Mother    Cancer - Ovarian Mother    Ovarian cancer Mother    Heart disease Father    Hypertension Father    Diabetes Father    Diabetes Sister    Diabetes Brother    Colon cancer Paternal Uncle    Colon cancer Paternal Uncle    Colon polyps Neg Hx    Esophageal cancer Neg Hx    Rectal cancer Neg Hx    Stomach cancer Neg Hx     Past Surgical History:  Procedure Laterality Date   AMPUTATION Right 01/25/2021   Procedure: RIGHT BELOW KNEE AMPUTATION;  Surgeon: Nadara Mustard, MD;  Location: [facility] OR;  Service: Orthopedics;  Laterality: Right;   CARDIAC CATHETERIZATION  12/19/2010   DIFFUSE NONOBSTRUCTIVE CAD; NONISCHEMIC CARDIOMYOPATHY; LEFT VENTRICULAR ANGIOGRAM WAS PERFORMED SECONDARY TO  ELEVATED LEFT VENTRICULAR FILLING PRESSURES   COLONOSCOPY     ~ age [age]   COLONOSCOPY W/ POLYPECTOMY     I & D EXTREMITY Bilateral 10/24/2019   Procedure: IRRIGATION AND DEBRIDEMENT BILATERAL EXTREMITY WOUND ON FOOT;  Surgeon: Allena Napoleon, MD;  Location: [facility] OR;  Service: Plastics;  Laterality: Bilateral;   ICD IMPLANT N/A 01/13/2022   Procedure: ICD IMPLANT;  Surgeon: Marinus Maw, MD;  Location: [facility] INVASIVE CV LAB;  Service: Cardiovascular;  Laterality: N/A;   METATARSAL HEAD EXCISION  Right 08/29/2018   Procedure: METATARSAL HEAD RESECTION;  Surgeon: Felecia Shelling, DPM;  Location: [facility];  Service: Podiatry;  Laterality: Right;   METATARSAL OSTEOTOMY Right 08/29/2018   Procedure: SUB FIFTHE METATARSIA RIGHT FOOT;  Surgeon: Felecia Shelling, DPM;  Location: [facility];  Service: Podiatry;  Laterality: Right;   MULTIPLE EXTRACTIONS WITH ALVEOLOPLASTY  01/27/2014   "all my teeth; 4 Quadrants of alveoloplasty   MULTIPLE EXTRACTIONS WITH ALVEOLOPLASTY N/A 01/27/2014   Procedure: EXTRACTION OF TEETH #'1, 2, 3, 4, 5, 6, 7, 8, 9, 10, 11, 12, 13, 14, 15, 16, 17, 20, 21, 22, 23, 24, 25, 26, 27, 28, 29, 31 and 32 WITH ALVEOLOPLASTY;  Surgeon: Charlynne Pander, DDS;  Location: [facility];  Service: Oral Surgery;  Laterality: N/A;   ORIF FINGER / THUMB FRACTURE Right    POLYPECTOMY     RIGHT HEART CATH N/A 01/27/2021   Procedure: RIGHT HEART CATH;  Surgeon: Dolores Patty, MD;  Location: [facility] INVAS[facility] CV LAB;  Service: Cardiovascular;  Laterality: N/A;   RIGHT/LEFT HEART CATH AND CORONARY ANGIOGRAPHY N/A 09/26/2017   Procedure: RIGHT/LEFT HEART CATH AND CORONARY ANGIOGRAPHY;  Surgeon: Dolores Patty, MD;  [facility];  Service: Cardiovascular;  Laterality: N/A;   RIGHT/LEFT HEART CATH AND CORONARY ANGIOGRAPHY N/A 04/12/2021   Procedure: RIGHT/LEFT HEART CATH AND CORONARY ANGIOGRAPHY;  Surgeon: Dolores Patty, MD;  [facility]: [facility] LAB;  Service: Cardiovascular;  Laterality: N/A;   SKIN SPLIT GRAFT Bilateral 10/24/2019   Procedure: SKIN GRAFT SPLIT THICKNESS LEFT THIGH;  Surgeon: Allena Napoleon, MD;  [facility]: [facility];  Service: Plastics;  Laterality: Bilateral;   WOUND DEBRIDEMENT  Right 08/29/2018   Procedure: Debridement of ulcer on right fifth metatarsal;  Surgeon: Felecia Shelling, DPM;  [facility];  Service: Podiatry;  Laterality: Right;   Social History   Occupational History   Occupation: Mortician  Tobacco Use   Smoking status: Never   Smokeless  tobacco: Never  Vaping Use   Vaping status: Never Used  Substance and Sexual Activity   Alcohol use: No   Drug use: No   Sexual activity: Not on file

## 2023-06-15 NOTE — Progress Notes (Signed)
Mitchell [name] is a 52 y.o. male with the following history as recorded in EpicCare:  Patient Active Problem List   Diagnosis Date Noted   History of right below knee amputation (HCC) 03/16/2023   Diabetes mellitus (HCC) 03/16/2023   BPH with obstruction/lower urinary tract symptoms 11/21/2022   UTI (urinary tract infection) 10/23/2022   ICD (implantable cardioverter-defibrillator) in place 04/19/2022   Near syncope 12/08/2021   CKD (chronic kidney disease) stage 3, GFR 30-59 ml/min (HCC) 12/08/2021   PVD s/p R LE amputation  12/08/2021   Diabetic retinopathy associated with type 2 diabetes mellitus (HCC) 06/06/2021   CAD (coronary artery disease) with flat troponin  04/11/2021   S/P below knee amputation, right (HCC) 02/07/2021   AKI (acute kidney injury) (HCC) 01/23/2021   QT prolongation 07/30/2019   Acute on chronic combined systolic and diastolic CHF (congestive heart failure) (HCC) 07/29/2019   Stroke (HCC) 05/08/2017   Uncontrolled type 2 diabetes mellitus with hyperglycemia, with long-term current use of insulin (HCC) 12/07/2015   Chronic systolic heart failure (HCC) 10/02/2011   Organic impotence 10/02/2011   Obstructive sleep apnea 10/11/2007   HLD (hyperlipidemia) 10/10/2007   HYPOKALEMIA 10/10/2007   Obesity, Class III, BMI 40-49.9 (morbid obesity) (HCC) 10/10/2007   Essential hypertension 10/10/2007   PREMATURE VENTRICULAR CONTRACTIONS 10/10/2007   History of colonic polyps 10/10/2007    Current Outpatient Medications  Medication Sig Dispense Refill   Accu-Chek Softclix Lancets lancets Use to test blood sugars up to 4 times daily as needed. 100 each 0   acetaminophen-codeine (TYLENOL #3) 300-30 MG tablet Take 1 tablet by mouth every 6 (six) hours as needed for moderate pain. 30 tablet 0   amitriptyline (ELAVIL) 10 MG tablet Take 1 tablet (10 mg total) by mouth at bedtime. 90 tablet 2   Ascorbic Acid (VITAMIN C PO) Take 1 tablet by mouth daily.     aspirin 81 MG EC  tablet Take 1 tablet (81 mg total) by mouth daily. 30 tablet 12   blood glucose meter kit and supplies Dispense based on patient and insurance preference. Use up to four times daily as directed. (FOR ICD-10 E10.9, E11.9). 1 each 0   Blood Glucose Monitoring Suppl (BLOOD GLUCOSE MONITOR SYSTEM) w/Device KIT Use to test blood sugar up to 4 times daily as needed. 1 kit 0   carvedilol (COREG) 6.25 MG tablet Take 1 tablet (6.25 mg total) by mouth 2 (two) times daily with a meal. 180 tablet 3   Cholecalciferol (VITAMIN D-3 PO) Take 1 capsule by mouth daily.     clopidogrel (PLAVIX) 75 MG tablet Take 1 tablet (75 mg total) by mouth daily. 90 tablet 3   gabapentin (NEURONTIN) 100 MG capsule Take 1 capsule (100 mg total) by mouth 2 (two) times daily. (Patient taking differently: Take 100 mg by mouth every evening.) 60 capsule 3   glipiZIDE (GLUCOTROL) 5 MG tablet Take 1 tablet (5 mg total) by mouth 2 (two) times daily before a meal. 180 tablet 3   glucose blood (ACCU-CHEK GUIDE) test strip Use to test blood sugars up to 4 times daily as needed. 100 each 0   Insulin Glargine (BASAGLAR KWIKPEN) 100 UNIT/ML Inject 36 Units into the skin daily. 45 mL 3   Insulin Pen Needle (BD PEN NEEDLE MICRO U/F) 32G X 6 MM MISC 1 Device by Does not apply route daily in the afternoon. 100 each 3   methocarbamol (ROBAXIN) 500 MG tablet TAKE 1 TABLET BY MOUTH EVERY 6  HOURS AS NEEDED FOR MUSCLE SPASM 30 tablet 1   pantoprazole (PROTONIX) 40 MG tablet Take 1 tablet (40 mg total) by mouth daily as needed (acid reflux). 30 tablet 6   potassium chloride SA (KLOR-CON M) 20 MEQ tablet TAKE 1 TABLET BY MOUTH EVERY OTHER DAY 45 tablet 7   rosuvastatin (CRESTOR) 40 MG tablet Take 1 tablet (40 mg total) by mouth daily. 90 tablet 3   sacubitril-valsartan (ENTRESTO) 49-51 MG Take 1 tablet by mouth 2 (two) times daily. 180 tablet 3   spironolactone (ALDACTONE) 25 MG tablet Take 0.5 tablets (12.5 mg total) by mouth daily. 90 tablet 3    tadalafil (CIALIS) 20 MG tablet Take 1 tablet (20 mg total) by mouth daily as needed for erectile dysfunction. 10 tablet 11   tamsulosin (FLOMAX) 0.4 MG CAPS capsule Take 1 capsule (0.4 mg total) by mouth daily after supper. 30 capsule 11   torsemide (DEMADEX) 20 MG tablet Take 3 tablets (60 mg total) by mouth daily. NEEDS FOLLOW UP APPOINTMENT FOR MORE REFILLS 90 tablet 0   empagliflozin (JARDIANCE) 25 MG TABS tablet Take 1 tablet (25 mg total) by mouth daily before breakfast. (Patient not taking: Reported on 06/15/2023) 90 tablet 3   No current facility-administered medications for this visit.    Allergies: Farxiga [dapagliflozin], Nsaids, and Trulicity [dulaglutide]  Past Medical History:  Diagnosis Date   Bipolar disorder (HCC)    CAD (coronary artery disease)    a. diffuse 3v CAD by cath 2019, medical therapy recommended.   Cataract    forming    Chronic systolic CHF (congestive heart failure) (HCC)    CVA (cerebral infarction)    No residual deficits   Depression    PTSD,    Diabetes mellitus    TYPE II; UNCONTROLLED BY HEMOGLOBIN A1c; STABLE AS  PER DISCHARGE   Headache(784.0)    Herpes simplex of male genitalia    History of colonic polyps    Hyperlipidemia    Hypertension    Myocardial infarction (HCC) 1987   (while playing football)   Obesity    OSA (obstructive sleep apnea)    repeat study 2018 without significant OSA   Pneumonia    Post-cardiac injury syndrome (HCC)    History of cardiac injury from blunt trauma   Pulmonary hypertension (HCC)    a. moderately elevated PASP 07/2019.   PVCs (premature ventricular contractions)    Schizophrenia (HCC)    Goes to [facility]   Sleep apnea    Stroke [facility]) 2005   some left side weakness   Syncope    Recurrent, thought to be vasovagal. Also has h/o frequent PVCs.     Past Surgical History:  Procedure Laterality Date   AMPUTATION Right 01/25/2021   Procedure: RIGHT BELOW KNEE AMPUTATION;  Surgeon:  Nadara Mustard, MD;  Location: [facility] OR;  Service: Orthopedics;  Laterality: Right;   CARDIAC CATHETERIZATION  12/19/2010   DIFFUSE NONOBSTRUCTIVE CAD; NONISCHEMIC CARDIOMYOPATHY; LEFT VENTRICULAR ANGIOGRAM WAS PERFORMED SECONDARY TO  ELEVATED LEFT VENTRICULAR FILLING PRESSURES   COLONOSCOPY     ~ age [age]   COLONOSCOPY W/ POLYPECTOMY     I & D EXTREMITY Bilateral 10/24/2019   Procedure: IRRIGATION AND DEBRIDEMENT BILATERAL EXTREMITY WOUND ON FOOT;  Surgeon: Allena Napoleon, MD;  Location: [facility];  Service: Plastics;  Laterality: Bilateral;   ICD IMPLANT N/A 01/13/2022   Procedure: ICD IMPLANT;  Surgeon: Marinus Maw, MD;  Location: [facility] INVASIVE CV LAB;  Service: Cardiovascular;  Laterality: N/A;   METATARSAL HEAD EXCISION Right 08/29/2018   Procedure: METATARSAL HEAD RESECTION;  Surgeon: Felecia Shelling, DPM;  Location: [facility];  Service: Podiatry;  Laterality: Right;   METATARSAL OSTEOTOMY Right 08/29/2018   Procedure: SUB FIFTHE METATARSIA RIGHT FOOT;  Surgeon: Felecia Shelling, DPM;  Location: [facility];  Service: Podiatry;  Laterality: Right;   MULTIPLE EXTRACTIONS WITH ALVEOLOPLASTY  01/27/2014   "all my teeth; 4 Quadrants of alveoloplasty   MULTIPLE EXTRACTIONS WITH ALVEOLOPLASTY N/A 01/27/2014   Procedure: EXTRACTION OF TEETH #'1, 2, 3, 4, 5, 6, 7, 8, 9, 10, 11, 12, 13, 14, 15, 16, 17, 20, 21, 22, 23, 24, 25, 26, 27, 28, 29, 31 and 32 WITH ALVEOLOPLASTY;  Surgeon: Charlynne Pander, DDS;  Location: [facility];  Service: Oral Surgery;  Laterality: N/A;   ORIF FINGER / THUMB FRACTURE Right    POLYPECTOMY     RIGHT HEART CATH N/A 01/27/2021   Procedure: RIGHT HEART CATH;  Surgeon: Dolores Patty, MD;  [facility] INVASIVE CV LAB;  Service: Cardiovascular;  Laterality: N/A;   RIGHT/LEFT HEART CATH AND CORONARY ANGIOGRAPHY N/A 09/26/2017   Procedure: RIGHT/LEFT HEART CATH AND CORONARY ANGIOGRAPHY;  Surgeon: Dolores Patty, MD;  [facility] LAB;  Service: Cardiovascular;   Laterality: N/A;   RIGHT/LEFT HEART CATH AND CORONARY ANGIOGRAPHY N/A 04/12/2021   Procedure: RIGHT/LEFT HEART CATH AND CORONARY ANGIOGRAPHY;  Surgeon: Dolores Patty, MD;  [facility]: [facility] LAB;  Service: Cardiovascular;  Laterality: N/A;   SKIN SPLIT GRAFT Bilateral 10/24/2019   Procedure: SKIN GRAFT SPLIT THICKNESS LEFT THIGH;  Surgeon: Allena Napoleon, MD;  Location: [facility];  Service: Plastics;  Laterality: Bilateral;   WOUND DEBRIDEMENT Right 08/29/2018   Procedure: Debridement of ulcer on right fifth metatarsal;  Surgeon: Felecia Shelling, DPM;  Location: [facility];  Service: Podiatry;  Laterality: Right;    Family History  Problem Relation Age of Onset   Heart disease Mother        MI   Heart failure Mother    Diabetes Mother        ALSO IN MOST OF HIS SIBLINGS; 2 UNLCES HAVE ALSO PASSED AWAY FROM DM   Cardiomyopathy Mother    Cancer - Ovarian Mother    Ovarian cancer Mother    Heart disease Father    Hypertension Father    Diabetes Father    Diabetes Sister    Diabetes Brother    Colon cancer Paternal Uncle    Colon cancer Paternal Uncle    Colon polyps Neg Hx    Esophageal cancer Neg Hx    Rectal cancer Neg Hx    Stomach cancer Neg Hx     Social History   Tobacco Use   Smoking status: Never   Smokeless tobacco: Never  Substance Use Topics   Alcohol use: No    Subjective:   Patient presents with concerns for episodes of diarrhea "for several weeks." Patient is suspicious that symptoms are related to [place]; he feels that he had the same reaction on Farxiga; he stopped the Jardiance on Tuesday of this week/ took Pepto-Bismol on Tuesday; has had no further diarrhea but has not had any type of bowel movement;      Objective:  Vitals:   06/15/23 1522  BP: 126/80  Pulse: 75  Resp: 18  SpO2: 94%  Weight: 298 lb (135.2 kg)  Height: 5\' 11"  (1.803 m)    General:  Well developed, well nourished, in no acute distress  Skin : Warm and dry.  Head:  Normocephalic and atraumatic  Eyes: Sclera and conjunctiva clear; pupils round and reactive to light; extraocular movements intact  Ears: External normal; canals clear; tympanic membranes normal  Oropharynx: Pink, supple. No suspicious lesions  Neck: Supple without thyromegaly, adenopathy  Lungs: Respirations unlabored; clear to auscultation bilaterally without wheeze, rales, rhonchi  CVS exam: normal rate and regular rhythm.  Abdomen: Soft; nontender; nondistended; normoactive bowel sounds; no masses or hepatosplenomegaly  Musculoskeletal: No deformities; no active joint inflammation  Extremities: No edema, cyanosis, clubbing  Vessels: Symmetric bilaterally  Neurologic: Alert and oriented; speech intact; face symmetrical; in wheelchair;   Assessment:  1. Diarrhea, unspecified type     Plan:  ? Related to use of Jardiance; symptoms have improved since he stopped the Jardiance; he will reach out to his CHF provider to let that office know that he cannot tolerate the Jardiance; In case the symptoms return, he will take home a stool culture kit/ C. Diff kit and bring back as needed;  Follow up with his PCP as needed otherwise.   No follow-ups on file.  Orders Placed This Encounter  Procedures   Stool Culture    Standing Status:   Future    Standing Expiration Date:   06/14/2024   Clostridium difficile culture-fecal    Standing Status:   Future    Standing Expiration Date:   06/14/2024    Requested Prescriptions    No prescriptions requested or ordered in this encounter

## 2023-07-04 ENCOUNTER — Telehealth: Payer: Self-pay

## 2023-07-04 NOTE — Telephone Encounter (Signed)
The pt called because his ICD has alarmed 3 days in a row. His daughter called the ambulance. His vitals are good. I asked the patient where does he sleep? He said the couch. I asked him where is the monitor located and he states his bedroom. I informed him if there is an alert on his ICD and he is not beside his monitor then his ICD is going to alarm. He has to be right beside the monitor when he is sleeping because the monitor is programmed to check him between the hours of midnight and  5 am. If there is an alert and the ICD cannot find the monitor then the ICD will alarm. I asked him to send a transmission when he get home for the nurse to review.

## 2023-07-09 NOTE — Telephone Encounter (Signed)
I got the pt transmission. Leigh reviewed the transmission and stated everything looks good. I told the patient that everything looks good and if his ICD beeps again to give [place] a call.

## 2023-07-11 ENCOUNTER — Telehealth: Payer: Self-pay

## 2023-07-11 NOTE — Telephone Encounter (Signed)
Pt's ICD has been alarming nightly.  This was d/t an unsuccessful [employer] transmission.  This situation has been corrected, by device requires interrogation to be cleared.  Pt seen in device clinic today and device interrogated.  This should clear alarm per [employer] tech services.  Will continue to monitor.

## 2023-07-11 NOTE — Telephone Encounter (Signed)
The pt ICD is still alarming. I asked the patient when can he come to the [address] office? He states he will give me a call when he can come.

## 2023-07-16 ENCOUNTER — Ambulatory Visit (INDEPENDENT_AMBULATORY_CARE_PROVIDER_SITE_OTHER): Payer: No Typology Code available for payment source

## 2023-07-16 DIAGNOSIS — I5022 Chronic systolic (congestive) heart failure: Secondary | ICD-10-CM | POA: Diagnosis not present

## 2023-07-16 LAB — CUP PACEART REMOTE DEVICE CHECK
Battery Remaining Longevity: 122 mo
Battery Voltage: 3 V
Brady Statistic RV Percent Paced: 0.01 %
Date Time Interrogation Session: 20241216052703
HighPow Impedance: 52 Ohm
Implantable Lead Connection Status: 753985
Implantable Lead Implant Date: 20230616
Implantable Lead Location: 753860
Implantable Lead Model: 6935
Implantable Pulse Generator Implant Date: 20230616
Lead Channel Impedance Value: 342 Ohm
Lead Channel Impedance Value: 437 Ohm
Lead Channel Pacing Threshold Amplitude: 0.625 V
Lead Channel Pacing Threshold Pulse Width: 0.4 ms
Lead Channel Sensing Intrinsic Amplitude: 22.25 mV
Lead Channel Sensing Intrinsic Amplitude: 22.25 mV
Lead Channel Setting Pacing Amplitude: 2 V
Lead Channel Setting Pacing Pulse Width: 0.4 ms
Lead Channel Setting Sensing Sensitivity: 0.3 mV
Zone Setting Status: 755011

## 2023-08-08 ENCOUNTER — Ambulatory Visit: Payer: No Typology Code available for payment source | Admitting: Internal Medicine

## 2023-08-13 ENCOUNTER — Other Ambulatory Visit (HOSPITAL_COMMUNITY): Payer: Self-pay | Admitting: Internal Medicine

## 2023-08-13 DIAGNOSIS — I5022 Chronic systolic (congestive) heart failure: Secondary | ICD-10-CM

## 2023-08-13 DIAGNOSIS — I10 Essential (primary) hypertension: Secondary | ICD-10-CM

## 2023-08-20 NOTE — Progress Notes (Signed)
Remote ICD transmission.   

## 2023-08-21 ENCOUNTER — Encounter (HOSPITAL_COMMUNITY): Payer: No Typology Code available for payment source

## 2023-09-06 ENCOUNTER — Ambulatory Visit (INDEPENDENT_AMBULATORY_CARE_PROVIDER_SITE_OTHER): Payer: Self-pay | Admitting: Orthopedic Surgery

## 2023-09-06 DIAGNOSIS — L97521 Non-pressure chronic ulcer of other part of left foot limited to breakdown of skin: Secondary | ICD-10-CM

## 2023-09-10 ENCOUNTER — Encounter: Payer: Self-pay | Admitting: Orthopedic Surgery

## 2023-09-10 NOTE — Progress Notes (Signed)
 Office Visit Note   Patient: Mitchell [name]           Date of Birth: 1970-12-30           MRN: [id number] Visit Date: 09/06/2023              Requested by: Watt Harlene BROCKS, MD [address] [place],  [place] [address] PCP: Watt Harlene BROCKS, MD  Chief Complaint  Patient presents with   Left Foot - Wound Check      HPI: Patient is a 53 year old gentleman is seen in follow-up for ulceration left foot.  Patient also complains of painful onychomycotic nails.  Assessment & Plan: Visit Diagnoses: No diagnosis found.  Plan: Ulcer was debrided nails trimmed x 4.  Follow-Up Instructions: Return in about 3 months (around 12/04/2023).   Ortho Exam  Patient is alert, oriented, no adenopathy, well-dressed, normal affect, normal respiratory effort. Examination patient has a Wagner grade 1 ulcer on the left second toe.  After informed consent a 10 blade knife was used to debride the skin and soft tissue back to healthy viable bleeding granulation tissue.  There is no exposed bone or tendon no abscess.  Ulcers 10 mm in diameter and 1 mm deep after debridement.  Patient has thickened discolored onychomycotic nails x 4 he is unable to safely trim the nails on his own and the nails were trimmed x 4 without complications.  Imaging: No results found. No images are attached to the encounter.  Labs: Lab Results  Component Value Date   HGBA1C 8.4 (H) 05/16/2023   HGBA1C 8.3 (H) 01/22/2023   HGBA1C 10.2 (H) 10/25/2022   ESRSEDRATE 10 06/10/2023   ESRSEDRATE 20 (H) 03/08/2022   ESRSEDRATE 81 (H) 10/21/2019   CRP 10.3 (H) 01/28/2021   CRP 12.8 (H) 01/27/2021   CRP 21.1 (H) 01/26/2021   LABURIC 10.9 (H) 10/23/2022   REPTSTATUS 01/25/2023 FINAL 01/20/2023   GRAMSTAIN NO WBC SEEN NO ORGANISMS SEEN  01/20/2023   CULT  01/20/2023    RARE STAPHYLOCOCCUS CAPITIS NO ANAEROBES ISOLATED Performed at [facility], [address] [place], [place] [address]    [facility]  STAPHYLOCOCCUS CAPITIS 01/20/2023     Lab Results  Component Value Date   ALBUMIN  4.1 01/22/2023   ALBUMIN  4.4 11/08/2022   ALBUMIN  3.6 10/23/2022   PREALBUMIN 6.8 (L) 10/21/2019    Lab Results  Component Value Date   MG 2.3 11/29/2021   MG 1.5 (L) 04/12/2021   MG 1.8 02/16/2021   Lab Results  Component Value Date   VD25OH 11 (L) 08/16/2009    Lab Results  Component Value Date   PREALBUMIN 6.8 (L) 10/21/2019      Latest Ref Rng & Units 06/10/2023    9:47 PM 05/29/2023    9:58 AM 01/22/2023   10:58 AM  CBC EXTENDED  WBC 4.0 - 10.5 K/uL 8.7  9.8  8.1   RBC 4.22 - 5.81 MIL/uL 4.38  4.68  4.55   Hemoglobin 13.0 - 17.0 g/dL 86.8  86.4  86.5   HCT 39.0 - 52.0 % 39.7  42.5  41.9   Platelets 150 - 400 K/uL 125  175  149.0   NEUT# 1.7 - 7.7 K/uL 6.6     Lymph# 0.7 - 4.0 K/uL 1.4        There is no height or weight on file to calculate BMI.  Orders:  No orders of the defined types were placed  in this encounter.  No orders of the defined types were placed in this encounter.    Procedures: No procedures performed  Clinical Data: No additional findings.  ROS:  All other systems negative, except as noted in the HPI. Review of Systems  Objective: Vital Signs: There were no vitals taken for this visit.  Specialty Comments:  No specialty comments available.  PMFS History: Patient Active Problem List   Diagnosis Date Noted   History of right below knee amputation (HCC) 03/16/2023   Diabetes mellitus (HCC) 03/16/2023   BPH with obstruction/lower urinary tract symptoms 11/21/2022   UTI (urinary tract infection) 10/23/2022   ICD (implantable cardioverter-defibrillator) in place 04/19/2022   Near syncope 12/08/2021   CKD (chronic kidney disease) stage 3, GFR 30-59 ml/min (HCC) 12/08/2021   PVD s/p R LE amputation  12/08/2021   Diabetic retinopathy associated with type 2 diabetes mellitus (HCC) 06/06/2021   CAD (coronary artery disease) with flat troponin   04/11/2021   S/P below knee amputation, right (HCC) 02/07/2021   AKI (acute kidney injury) (HCC) 01/23/2021   QT prolongation 07/30/2019   Acute on chronic combined systolic and diastolic CHF (congestive heart failure) (HCC) 07/29/2019   Stroke (HCC) 05/08/2017   Uncontrolled type 2 diabetes mellitus with hyperglycemia, with long-term current use of insulin  (HCC) 12/07/2015   Chronic systolic heart failure (HCC) 10/02/2011   Organic impotence 10/02/2011   Obstructive sleep apnea 10/11/2007   HLD (hyperlipidemia) 10/10/2007   HYPOKALEMIA 10/10/2007   Obesity, Class III, BMI 40-49.9 (morbid obesity) (HCC) 10/10/2007   Essential hypertension 10/10/2007   PREMATURE VENTRICULAR CONTRACTIONS 10/10/2007   History of colonic polyps 10/10/2007   Past Medical History:  Diagnosis Date   Bipolar disorder (HCC)    CAD (coronary artery disease)    a. diffuse 3v CAD by cath 2019, medical therapy recommended.   Cataract    forming    Chronic systolic CHF (congestive heart failure) (HCC)    CVA (cerebral infarction)    No residual deficits   Depression    PTSD,    Diabetes mellitus    TYPE II; UNCONTROLLED BY HEMOGLOBIN A1c; STABLE AS  PER DISCHARGE   Headache(784.0)    Herpes simplex of male genitalia    History of colonic polyps    Hyperlipidemia    Hypertension    Myocardial infarction (HCC) 1987   (while playing football)   Obesity    OSA (obstructive sleep apnea)    repeat study 2018 without significant OSA   Pneumonia    Post-cardiac injury syndrome (HCC)    History of cardiac injury from blunt trauma   Pulmonary hypertension (HCC)    a. moderately elevated PASP 07/2019.   PVCs (premature ventricular contractions)    Schizophrenia (HCC)    Goes to [facility]   Sleep apnea    Stroke [facility]) 2005   some left side weakness   Syncope    Recurrent, thought to be vasovagal. Also has h/o frequent PVCs.     Family History  Problem Relation Age of Onset   Heart  disease Mother        MI   Heart failure Mother    Diabetes Mother        ALSO IN MOST OF HIS SIBLINGS; 2 UNLCES HAVE ALSO PASSED AWAY FROM DM   Cardiomyopathy Mother    Cancer - Ovarian Mother    Ovarian cancer Mother    Heart disease Father    Hypertension Father  Diabetes Father    Diabetes Sister    Diabetes Brother    Colon cancer Paternal Uncle    Colon cancer Paternal Uncle    Colon polyps Neg Hx    Esophageal cancer Neg Hx    Rectal cancer Neg Hx    Stomach cancer Neg Hx     Past Surgical History:  Procedure Laterality Date   AMPUTATION Right 01/25/2021   Procedure: RIGHT BELOW KNEE AMPUTATION;  Surgeon: Harden Jerona GAILS, MD;  Location: [facility] OR;  Service: Orthopedics;  Laterality: Right;   CARDIAC CATHETERIZATION  12/19/2010   DIFFUSE NONOBSTRUCTIVE CAD; NONISCHEMIC CARDIOMYOPATHY; LEFT VENTRICULAR ANGIOGRAM WAS PERFORMED SECONDARY TO  ELEVATED LEFT VENTRICULAR FILLING PRESSURES   COLONOSCOPY     ~ age [age]   COLONOSCOPY W/ POLYPECTOMY     I & D EXTREMITY Bilateral 10/24/2019   Procedure: IRRIGATION AND DEBRIDEMENT BILATERAL EXTREMITY WOUND ON FOOT;  Surgeon: Elisabeth Craig RAMAN, MD;  Location: [facility] OR;  Service: Plastics;  Laterality: Bilateral;   ICD IMPLANT N/A 01/13/2022   Procedure: ICD IMPLANT;  Surgeon: Waddell Danelle ORN, MD;  Location: [facility] INVASIVE CV LAB;  Service: Cardiovascular;  Laterality: N/A;   METATARSAL HEAD EXCISION Right 08/29/2018   Procedure: METATARSAL HEAD RESECTION;  Surgeon: Janit Thresa HERO, DPM;  Location: [facility] OR;  Service: Podiatry;  Laterality: Right;   METATARSAL OSTEOTOMY Right 08/29/2018   Procedure: SUB FIFTHE METATARSIA RIGHT FOOT;  Surgeon: Janit Thresa HERO, DPM;  Location: [facility] OR;  Service: Podiatry;  Laterality: Right;   MULTIPLE EXTRACTIONS WITH ALVEOLOPLASTY  01/27/2014   all my teeth; 4 Quadrants of alveoloplasty   MULTIPLE EXTRACTIONS WITH ALVEOLOPLASTY N/A 01/27/2014   Procedure: EXTRACTION OF TEETH #'1, 2, 3, 4, 5, 6, 7, 8, 9, 10, 11, 12,  13, 14, 15, 16, 17, 20, 21, 22, 23, 24, 25, 26, 27, 28, 29, 31 and 32 WITH ALVEOLOPLASTY;  Surgeon: Tanda JULIANNA Fanny, DDS;  Location: [facility] OR;  Service: Oral Surgery;  Laterality: N/A;   ORIF FINGER / THUMB FRACTURE Right    POLYPECTOMY     RIGHT HEART CATH N/A 01/27/2021   Procedure: RIGHT HEART CATH;  Surgeon: Cherrie Toribio SAUNDERS, MD;  Location: [facility] INVASIVE CV LAB;  Service: Cardiovascular;  Laterality: N/A;   RIGHT/LEFT HEART CATH AND CORONARY ANGIOGRAPHY N/A 09/26/2017   Procedure: RIGHT/LEFT HEART CATH AND CORONARY ANGIOGRAPHY;  Surgeon: Cherrie Toribio SAUNDERS, MD;  Location: [facility] INVASIVE CV LAB;  Service: Cardiovascular;  Laterality: N/A;   RIGHT/LEFT HEART CATH AND CORONARY ANGIOGRAPHY N/A 04/12/2021   Procedure: RIGHT/LEFT HEART CATH AND CORONARY ANGIOGRAPHY;  Surgeon: Cherrie Toribio SAUNDERS, MD;  Location: [facility] INVASIVE CV LAB;  Service: Cardiovascular;  Laterality: N/A;   SKIN SPLIT GRAFT Bilateral 10/24/2019   Procedure: SKIN GRAFT SPLIT THICKNESS LEFT THIGH;  Surgeon: Elisabeth Craig RAMAN, MD;  Location: [facility] OR;  Service: Plastics;  Laterality: Bilateral;   WOUND DEBRIDEMENT Right 08/29/2018   Procedure: Debridement of ulcer on right fifth metatarsal;  Surgeon: Janit Thresa HERO, DPM;  Location: [facility] OR;  Service: Podiatry;  Laterality: Right;   Social History   Occupational History   Occupation: Mortician  Tobacco Use   Smoking status: Never   Smokeless tobacco: Never  Vaping Use   Vaping status: Never Used  Substance and Sexual Activity   Alcohol use: No   Drug use: No   Sexual activity: Not on file

## 2023-09-19 ENCOUNTER — Other Ambulatory Visit (HOSPITAL_COMMUNITY): Payer: Self-pay | Admitting: Internal Medicine

## 2023-09-19 ENCOUNTER — Other Ambulatory Visit: Payer: Self-pay | Admitting: Family Medicine

## 2023-09-19 DIAGNOSIS — G4700 Insomnia, unspecified: Secondary | ICD-10-CM

## 2023-09-23 ENCOUNTER — Encounter (HOSPITAL_COMMUNITY): Payer: No Typology Code available for payment source

## 2023-09-23 ENCOUNTER — Other Ambulatory Visit: Payer: Self-pay

## 2023-09-23 ENCOUNTER — Observation Stay (HOSPITAL_COMMUNITY)
Admission: EM | Admit: 2023-09-23 | Discharge: 2023-09-24 | Disposition: A | Discharge status: Home / Self Care | Payer: Self-pay | Attending: Internal Medicine | Admitting: Internal Medicine

## 2023-09-23 ENCOUNTER — Inpatient Hospital Stay (HOSPITAL_COMMUNITY): Payer: Self-pay

## 2023-09-23 ENCOUNTER — Emergency Department (HOSPITAL_COMMUNITY): Payer: Self-pay

## 2023-09-23 ENCOUNTER — Encounter (HOSPITAL_COMMUNITY): Payer: Self-pay

## 2023-09-23 DIAGNOSIS — Z79899 Other long term (current) drug therapy: Secondary | ICD-10-CM

## 2023-09-23 DIAGNOSIS — I129 Hypertensive chronic kidney disease with stage 1 through stage 4 chronic kidney disease, or unspecified chronic kidney disease: Secondary | ICD-10-CM | POA: Insufficient documentation

## 2023-09-23 DIAGNOSIS — Z7902 Long term (current) use of antithrombotics/antiplatelets: Secondary | ICD-10-CM

## 2023-09-23 DIAGNOSIS — Z7982 Long term (current) use of aspirin: Secondary | ICD-10-CM

## 2023-09-23 DIAGNOSIS — I493 Ventricular premature depolarization: Secondary | ICD-10-CM | POA: Diagnosis present

## 2023-09-23 DIAGNOSIS — Z888 Allergy status to other drugs, medicaments and biological substances status: Secondary | ICD-10-CM

## 2023-09-23 DIAGNOSIS — E785 Hyperlipidemia, unspecified: Secondary | ICD-10-CM | POA: Diagnosis present

## 2023-09-23 DIAGNOSIS — R079 Chest pain, unspecified: Principal | ICD-10-CM | POA: Diagnosis present

## 2023-09-23 DIAGNOSIS — I2510 Atherosclerotic heart disease of native coronary artery without angina pectoris: Secondary | ICD-10-CM | POA: Diagnosis present

## 2023-09-23 DIAGNOSIS — I255 Ischemic cardiomyopathy: Secondary | ICD-10-CM | POA: Diagnosis present

## 2023-09-23 DIAGNOSIS — G4733 Obstructive sleep apnea (adult) (pediatric): Secondary | ICD-10-CM | POA: Diagnosis present

## 2023-09-23 DIAGNOSIS — Z8249 Family history of ischemic heart disease and other diseases of the circulatory system: Secondary | ICD-10-CM

## 2023-09-23 DIAGNOSIS — E1140 Type 2 diabetes mellitus with diabetic neuropathy, unspecified: Secondary | ICD-10-CM | POA: Diagnosis present

## 2023-09-23 DIAGNOSIS — F4310 Post-traumatic stress disorder, unspecified: Secondary | ICD-10-CM | POA: Diagnosis present

## 2023-09-23 DIAGNOSIS — Z833 Family history of diabetes mellitus: Secondary | ICD-10-CM

## 2023-09-23 DIAGNOSIS — E669 Obesity, unspecified: Secondary | ICD-10-CM | POA: Diagnosis present

## 2023-09-23 DIAGNOSIS — I5022 Chronic systolic (congestive) heart failure: Secondary | ICD-10-CM | POA: Diagnosis present

## 2023-09-23 DIAGNOSIS — E1122 Type 2 diabetes mellitus with diabetic chronic kidney disease: Secondary | ICD-10-CM | POA: Diagnosis present

## 2023-09-23 DIAGNOSIS — I10 Essential (primary) hypertension: Secondary | ICD-10-CM | POA: Diagnosis present

## 2023-09-23 DIAGNOSIS — Z794 Long term (current) use of insulin: Secondary | ICD-10-CM

## 2023-09-23 DIAGNOSIS — I25118 Atherosclerotic heart disease of native coronary artery with other forms of angina pectoris: Secondary | ICD-10-CM

## 2023-09-23 DIAGNOSIS — Z89511 Acquired absence of right leg below knee: Secondary | ICD-10-CM

## 2023-09-23 DIAGNOSIS — R072 Precordial pain: Secondary | ICD-10-CM | POA: Diagnosis present

## 2023-09-23 DIAGNOSIS — Z6841 Body Mass Index (BMI) 40.0 and over, adult: Secondary | ICD-10-CM

## 2023-09-23 DIAGNOSIS — R197 Diarrhea, unspecified: Secondary | ICD-10-CM | POA: Diagnosis present

## 2023-09-23 DIAGNOSIS — Z95810 Presence of automatic (implantable) cardiac defibrillator: Secondary | ICD-10-CM

## 2023-09-23 DIAGNOSIS — Z1152 Encounter for screening for COVID-19: Secondary | ICD-10-CM

## 2023-09-23 DIAGNOSIS — N1832 Chronic kidney disease, stage 3b: Secondary | ICD-10-CM | POA: Diagnosis present

## 2023-09-23 DIAGNOSIS — Z8673 Personal history of transient ischemic attack (TIA), and cerebral infarction without residual deficits: Secondary | ICD-10-CM

## 2023-09-23 DIAGNOSIS — Z7984 Long term (current) use of oral hypoglycemic drugs: Secondary | ICD-10-CM

## 2023-09-23 DIAGNOSIS — I272 Pulmonary hypertension, unspecified: Secondary | ICD-10-CM | POA: Diagnosis present

## 2023-09-23 DIAGNOSIS — I252 Old myocardial infarction: Secondary | ICD-10-CM

## 2023-09-23 DIAGNOSIS — I130 Hypertensive heart and chronic kidney disease with heart failure and stage 1 through stage 4 chronic kidney disease, or unspecified chronic kidney disease: Principal | ICD-10-CM | POA: Diagnosis present

## 2023-09-23 DIAGNOSIS — Z9713 Presence of artificial right leg (complete) (partial): Secondary | ICD-10-CM

## 2023-09-23 DIAGNOSIS — N183 Chronic kidney disease, stage 3 unspecified: Secondary | ICD-10-CM | POA: Diagnosis present

## 2023-09-23 DIAGNOSIS — I428 Other cardiomyopathies: Secondary | ICD-10-CM | POA: Diagnosis present

## 2023-09-23 LAB — COMPREHENSIVE METABOLIC PANEL
ALT: 15 U/L (ref 0–44)
AST: 21 U/L (ref 15–41)
Albumin: 3.7 g/dL (ref 3.5–5.0)
Alkaline Phosphatase: 73 U/L (ref 38–126)
Anion gap: 12 (ref 5–15)
BUN: 42 mg/dL — ABNORMAL HIGH (ref 6–20)
CO2: 22 mmol/L (ref 22–32)
Calcium: 8.7 mg/dL — ABNORMAL LOW (ref 8.9–10.3)
Chloride: 103 mmol/L (ref 98–111)
Creatinine, Ser: 1.88 mg/dL — ABNORMAL HIGH (ref 0.61–1.24)
GFR, Estimated: 42 mL/min — ABNORMAL LOW (ref >60)
Glucose, Bld: 310 mg/dL — ABNORMAL HIGH (ref 70–99)
Potassium: 4.2 mmol/L (ref 3.5–5.1)
Sodium: 137 mmol/L (ref 135–145)
Total Bilirubin: 0.7 mg/dL (ref 0.0–1.2)
Total Protein: 7.3 g/dL (ref 6.5–8.1)

## 2023-09-23 LAB — C DIFFICILE QUICK SCREEN W PCR REFLEX
C Diff antigen: NEGATIVE
C Diff interpretation: NOT DETECTED
C Diff toxin: NEGATIVE

## 2023-09-23 LAB — TROPONIN I (HIGH SENSITIVITY)
Troponin I (High Sensitivity): 16 ng/L (ref <18)
Troponin I (High Sensitivity): 16 ng/L (ref <18)

## 2023-09-23 LAB — CBC WITH DIFFERENTIAL/PLATELET
Abs Immature Granulocytes: 0.05 10*3/uL (ref 0.00–0.07)
Basophils Absolute: 0 10*3/uL (ref 0.0–0.1)
Basophils Relative: 0 %
Eosinophils Absolute: 0.1 10*3/uL (ref 0.0–0.5)
Eosinophils Relative: 2 %
HCT: 43.6 % (ref 39.0–52.0)
Hemoglobin: 14.3 g/dL (ref 13.0–17.0)
Immature Granulocytes: 1 %
Lymphocytes Relative: 16 %
Lymphs Abs: 1.4 10*3/uL (ref 0.7–4.0)
MCH: 29.6 pg (ref 26.0–34.0)
MCHC: 32.8 g/dL (ref 30.0–36.0)
MCV: 90.3 fL (ref 80.0–100.0)
Monocytes Absolute: 0.6 10*3/uL (ref 0.1–1.0)
Monocytes Relative: 6 %
Neutro Abs: 6.9 10*3/uL (ref 1.7–7.7)
Neutrophils Relative %: 75 %
Platelets: 137 10*3/uL — ABNORMAL LOW (ref 150–400)
RBC: 4.83 MIL/uL (ref 4.22–5.81)
RDW: 12.3 % (ref 11.5–15.5)
WBC: 9.1 10*3/uL (ref 4.0–10.5)
nRBC: 0 % (ref 0.0–0.2)

## 2023-09-23 LAB — RESP PANEL BY RT-PCR (RSV, FLU A&B, COVID)  RVPGX2
Influenza A by PCR: NEGATIVE
Influenza B by PCR: NEGATIVE
Resp Syncytial Virus by PCR: NEGATIVE
SARS Coronavirus 2 by RT PCR: NEGATIVE

## 2023-09-23 LAB — CBG MONITORING, ED
Glucose-Capillary: 264 mg/dL — ABNORMAL HIGH (ref 70–99)
Glucose-Capillary: 284 mg/dL — ABNORMAL HIGH (ref 70–99)

## 2023-09-23 LAB — BRAIN NATRIURETIC PEPTIDE: B Natriuretic Peptide: 99.1 pg/mL (ref 0.0–100.0)

## 2023-09-23 MED ORDER — AMITRIPTYLINE HCL 10 MG PO TABS
10.0000 mg | ORAL_TABLET | Freq: Every day | ORAL | Status: DC
  Administered 2023-09-24: 10 mg via ORAL
  Filled 2023-09-23: qty 1

## 2023-09-23 MED ORDER — ASPIRIN 81 MG PO TBEC
81.0000 mg | DELAYED_RELEASE_TABLET | Freq: Every day | ORAL | Status: DC
  Administered 2023-09-24: 81 mg via ORAL
  Filled 2023-09-23: qty 1

## 2023-09-23 MED ORDER — PANTOPRAZOLE SODIUM 40 MG IV SOLR
40.0000 mg | Freq: Two times a day (BID) | INTRAVENOUS | Status: DC
  Administered 2023-09-23 – 2023-09-24 (×2): 40 mg via INTRAVENOUS
  Filled 2023-09-23 (×2): qty 10

## 2023-09-23 MED ORDER — HEPARIN SODIUM (PORCINE) 5000 UNIT/ML IJ SOLN
5000.0000 [IU] | Freq: Three times a day (TID) | INTRAMUSCULAR | Status: DC
  Administered 2023-09-23: 5000 [IU] via SUBCUTANEOUS
  Filled 2023-09-23 (×2): qty 1

## 2023-09-23 MED ORDER — SPIRONOLACTONE 12.5 MG HALF TABLET: 12.5000 mg | ORAL_TABLET | Freq: Every day | ORAL | Status: DC

## 2023-09-23 MED ORDER — GABAPENTIN 100 MG PO CAPS
100.0000 mg | ORAL_CAPSULE | Freq: Once | ORAL | Status: AC
  Administered 2023-09-23: 100 mg via ORAL
  Filled 2023-09-23: qty 1

## 2023-09-23 MED ORDER — ROSUVASTATIN CALCIUM 20 MG PO TABS
40.0000 mg | ORAL_TABLET | Freq: Every day | ORAL | Status: DC
  Administered 2023-09-23 – 2023-09-24 (×2): 40 mg via ORAL
  Filled 2023-09-23 (×2): qty 2

## 2023-09-23 MED ORDER — ISOSORBIDE MONONITRATE ER 30 MG PO TB24
30.0000 mg | ORAL_TABLET | Freq: Every day | ORAL | Status: DC
  Administered 2023-09-24: 30 mg via ORAL
  Filled 2023-09-23: qty 1

## 2023-09-23 MED ORDER — CLOPIDOGREL BISULFATE 75 MG PO TABS
75.0000 mg | ORAL_TABLET | Freq: Every day | ORAL | Status: DC
  Administered 2023-09-23 – 2023-09-24 (×2): 75 mg via ORAL
  Filled 2023-09-23 (×2): qty 1

## 2023-09-23 MED ORDER — TAMSULOSIN HCL 0.4 MG PO CAPS
0.4000 mg | ORAL_CAPSULE | Freq: Every day | ORAL | Status: DC
  Administered 2023-09-23: 0.4 mg via ORAL
  Filled 2023-09-23: qty 1

## 2023-09-23 MED ORDER — ACETAMINOPHEN 325 MG PO TABS: 650.0000 mg | ORAL_TABLET | ORAL | Status: DC | PRN

## 2023-09-23 MED ORDER — INSULIN ASPART 100 UNIT/ML IJ SOLN
0.0000 [IU] | Freq: Three times a day (TID) | INTRAMUSCULAR | Status: DC
  Administered 2023-09-23 – 2023-09-24 (×2): 8 [IU] via SUBCUTANEOUS

## 2023-09-23 MED ORDER — NITROGLYCERIN 0.4 MG SL SUBL: 0.4000 mg | SUBLINGUAL_TABLET | SUBLINGUAL | Status: DC | PRN

## 2023-09-23 MED ORDER — CARVEDILOL 3.125 MG PO TABS
6.2500 mg | ORAL_TABLET | Freq: Two times a day (BID) | ORAL | Status: DC
  Administered 2023-09-23 – 2023-09-24 (×2): 6.25 mg via ORAL
  Filled 2023-09-23 (×2): qty 2

## 2023-09-23 NOTE — Progress Notes (Signed)
 VASCULAR LAB    Bilateral lower extremity venous duplex has been performed.  See CV proc for preliminary results.   Daman Steffenhagen, RVT 09/23/2023, 5:58 PM

## 2023-09-23 NOTE — Consult Note (Addendum)
 Cardiology Consultation   Patient ID: [name] MRN: [id number]; DOB: 04-13-1971  Admit date: 09/23/2023 Date of Consult: 09/23/2023  PCP:  Mitchell Cables, MD   [facility] Providers Cardiologist:  Mitchell Meres, MD   {  Patient Profile:   Mitchell [name] is a 53 y.o. male with a hx of HFrEF, mixed ischemic/nonischemic cardiomyopathy s/p ICD 12/2021, severe 3-vessel CAD, obesity, hypertension, diabetes, OSA, bipolar disease/schizophrenia, history of prior CVA  who is being seen 09/23/2023 for the evaluation of chest pain at the request of Mitchell [name].  History of Present Illness:   Mitchell [name] has a medical history of HFrEF, mixed ischemic/nonischemic cardiomyopathy s/p ICD 12/2021, severe 3-vessel CAD, obesity, hypertension, diabetes, OSA, bipolar disease/schizophrenia, history of prior CVA. He presented to the [facility] ED on 09/23/2023 complaining of chest pain and diarrhea.   He reports watery non-bloody diarrhea for the last few days and then today reported sudden substernal chest pain. Denies any dizziness, nausea, vomiting, abdominal pain, melena, palpitaitons, syncope. He was given nitroglycerin by EMS with improvement in his symptoms. Patient is currently asymptomatic. EKG in ED did not show any acute ischemic changes. BNP 99, glucose 310, creatinine 1.88, troponin 16, negative respiratory panel, CXR showed mild cardiomegaly, no active disease.   He has an extensive cardiac history. He had a RHC/LHC done 08/2017 which originally showed 3 vessel disease with preserved CO/CI without any options for PCI. He saw EP and was supposed to get ICD but was lost to follow up. Then was admitted 03/2021 for HFrEF and NSTEMI. Diuresed with IV Lasix and started on GDMT with a similar resulting RHC//LHC. He scheduled ICD implant in 12/2021.   He was last seen in advanced heart failure clinic by Mitchell Rome, FNP on 05/29/2023. At this time his treatment regimen included: torsemid  60 mg daily + 20 mEq K, CoReg 6.25 mg BID, Entresto 49/51 mg BID, spironolactone 12.5 mg daily, Jardiance 25 mg daily, DAPT with ASA 81 mg and Plavix 75 mg daily, Crestor 40 mg daily.  After speaking with patient he agrees with the history as stated above. States he had diarrhea x 3 days and the sudden onset of 9/10 chest pain today while [place] flea market. He was given a single dose of SL nitroglycerin which relieved all of his symptoms. He is currently symptom free. He denies any use of Cialis or any other PDE5 inhibitors for years. His main complaint at this time is regarding his diarrhea that has been going on for three days.  Past Medical History:  Diagnosis Date   Bipolar disorder (HCC)    CAD (coronary artery disease)    a. diffuse 3v CAD by cath 2019, medical therapy recommended.   Cataract    forming    Chronic systolic CHF (congestive heart failure) (HCC)    CVA (cerebral infarction)    No residual deficits   Depression    PTSD,    Diabetes mellitus    TYPE II; UNCONTROLLED BY HEMOGLOBIN A1c; STABLE AS  PER DISCHARGE   Headache(784.0)    Herpes simplex of male genitalia    History of colonic polyps    Hyperlipidemia    Hypertension    Myocardial infarction (HCC) 1987   (while playing football)   Obesity    OSA (obstructive sleep apnea)    repeat study 2018 without significant OSA   Pneumonia    Post-cardiac injury syndrome (HCC)    History of cardiac injury from blunt  trauma   Pulmonary hypertension (HCC)    a. moderately elevated PASP 07/2019.   PVCs (premature ventricular contractions)    Schizophrenia (HCC)    Goes to [facility]   Sleep apnea    Stroke [facility]) 2005   some left side weakness   Syncope    Recurrent, thought to be vasovagal. Also has h/o frequent PVCs.    Past Surgical History:  Procedure Laterality Date   AMPUTATION Right 01/25/2021   Procedure: RIGHT BELOW KNEE AMPUTATION;  Surgeon: Mitchell Mustard, MD;  Location: [facility] OR;   Service: Orthopedics;  Laterality: Right;   CARDIAC CATHETERIZATION  12/19/2010   DIFFUSE NONOBSTRUCTIVE CAD; NONISCHEMIC CARDIOMYOPATHY; LEFT VENTRICULAR ANGIOGRAM WAS PERFORMED SECONDARY TO  ELEVATED LEFT VENTRICULAR FILLING PRESSURES   COLONOSCOPY     ~ age [age]   COLONOSCOPY W/ POLYPECTOMY     I & D EXTREMITY Bilateral 10/24/2019   Procedure: IRRIGATION AND DEBRIDEMENT BILATERAL EXTREMITY WOUND ON FOOT;  Surgeon: Mitchell Napoleon, MD;  Location: [facility] OR;  Service: Plastics;  Laterality: Bilateral;   ICD IMPLANT N/A 01/13/2022   Procedure: ICD IMPLANT;  Surgeon: Mitchell Maw, MD;  Location: [facility] INVASIVE CV LAB;  Service: Cardiovascular;  Laterality: N/A;   METATARSAL HEAD EXCISION Right 08/29/2018   Procedure: METATARSAL HEAD RESECTION;  Surgeon: Mitchell [name], DPM;  Location: [facility] OR;  Service: Podiatry;  Laterality: Right;   METATARSAL OSTEOTOMY Right 08/29/2018   Procedure: SUB FIFTHE METATARSIA RIGHT FOOT;  Surgeon: Mitchell [name], DPM;  Location: [facility] OR;  Service: Podiatry;  Laterality: Right;   MULTIPLE EXTRACTIONS WITH ALVEOLOPLASTY  01/27/2014   "all my teeth; 4 Quadrants of alveoloplasty   MULTIPLE EXTRACTIONS WITH ALVEOLOPLASTY N/A 01/27/2014   Procedure: EXTRACTION OF TEETH #'1, 2, 3, 4, 5, 6, 7, 8, 9, 10, 11, 12, 13, 14, 15, 16, 17, 20, 21, 22, 23, 24, 25, 26, 27, 28, 29, 31 and 32 WITH ALVEOLOPLASTY;  Surgeon: Mitchell [name], DDS;  Location: [facility] OR;  Service: Oral Surgery;  Laterality: N/A;   ORIF FINGER / THUMB FRACTURE Right    POLYPECTOMY     RIGHT HEART CATH N/A 01/27/2021   Procedure: RIGHT HEART CATH;  Surgeon: Mitchell Patty, MD;  Location: [facility] INVASIVE CV LAB;  Service: Cardiovascular;  Laterality: N/A;   RIGHT/LEFT HEART CATH AND CORONARY ANGIOGRAPHY N/A 09/26/2017   Procedure: RIGHT/LEFT HEART CATH AND CORONARY ANGIOGRAPHY;  Surgeon: Mitchell Patty, MD;  Location: [facility] INVASIVE CV LAB;  Service: Cardiovascular;  Laterality: N/A;   RIGHT/LEFT HEART CATH AND  CORONARY ANGIOGRAPHY N/A 04/12/2021   Procedure: RIGHT/LEFT HEART CATH AND CORONARY ANGIOGRAPHY;  Surgeon: Mitchell Patty, MD;  Location: [facility] INVASIVE CV LAB;  Service: Cardiovascular;  Laterality: N/A;   SKIN SPLIT GRAFT Bilateral 10/24/2019   Procedure: SKIN GRAFT SPLIT THICKNESS LEFT THIGH;  Surgeon: Mitchell Napoleon, MD;  Location: [facility] OR;  Service: Plastics;  Laterality: Bilateral;   WOUND DEBRIDEMENT Right 08/29/2018   Procedure: Debridement of ulcer on right fifth metatarsal;  Surgeon: Mitchell [name], DPM;  Location: [facility] OR;  Service: Podiatry;  Laterality: Right;    Home Medications:  Prior to Admission medications   Medication Sig Start Date End Date Taking? Authorizing Provider  Accu-Chek Softclix Lancets lancets Use to test blood sugars up to 4 times daily as needed. 12/08/21   Orland Mustard, MD  acetaminophen-codeine (TYLENOL #3) 300-30 MG tablet Take 1 tablet by mouth every 6 (six) hours as needed for moderate pain.  06/15/22   [name], Gwenlyn Found, MD  amitriptyline (ELAVIL) 10 MG tablet Take 1 tablet (10 mg total) by mouth at bedtime. 09/20/23   [name], Gwenlyn Found, MD  Ascorbic Acid (VITAMIN C PO) Take 1 tablet by mouth daily.    [provider]  aspirin 81 MG EC tablet Take 1 tablet (81 mg total) by mouth daily. 04/13/21   Danford, Earl Lites, MD  blood glucose meter kit and supplies Dispense based on patient and insurance preference. Use up to four times daily as directed. (FOR ICD-10 E10.9, E11.9). 06/15/22   [name], Gwenlyn Found, MD  Blood Glucose Monitoring Suppl (BLOOD GLUCOSE MONITOR SYSTEM) w/Device KIT Use to test blood sugar up to 4 times daily as needed. 12/08/21   Arby Barrette, MD  carvedilol (COREG) 6.25 MG tablet Take 1 tablet (6.25 mg total) by mouth 2 (two) times daily with a meal. 10/30/22   Bensimhon, [name], MD  Cholecalciferol (VITAMIN D-3 PO) Take 1 capsule by mouth daily.    [provider]  clopidogrel (PLAVIX) 75 MG tablet Take 1 tablet by  mouth once daily 09/20/23   Bensimhon, [name], MD  empagliflozin (JARDIANCE) 25 MG TABS tablet Take 1 tablet (25 mg total) by mouth daily before breakfast. Patient not taking: Reported on 06/15/2023 05/21/23   Shamleffer, Konrad Dolores, MD  gabapentin (NEURONTIN) 100 MG capsule Take 1 capsule (100 mg total) by mouth 2 (two) times daily. Patient taking differently: Take 100 mg by mouth every evening. 07/26/22   [name], Gwenlyn Found, MD  glipiZIDE (GLUCOTROL) 5 MG tablet Take 1 tablet (5 mg total) by mouth 2 (two) times daily before a meal. 03/16/23   Shamleffer, Konrad Dolores, MD  glucose blood (ACCU-CHEK GUIDE) test strip Use to test blood sugars up to 4 times daily as needed. 12/08/21   Orland Mustard, MD  Insulin Glargine [facility] [place]) 100 UNIT/ML Inject 36 Units into the skin daily. 03/16/23   Shamleffer, Konrad Dolores, MD  Insulin Pen Needle (BD PEN NEEDLE MICRO U/F) 32G X 6 MM MISC 1 Device by Does not apply route daily in the afternoon. 03/16/23   Shamleffer, Konrad Dolores, MD  methocarbamol (ROBAXIN) 500 MG tablet TAKE 1 TABLET BY MOUTH EVERY 6 HOURS AS NEEDED FOR MUSCLE SPASM 12/11/22   [name], Gwenlyn Found, MD  pantoprazole (PROTONIX) 40 MG tablet Take 1 tablet (40 mg total) by mouth daily as needed (acid reflux). 02/13/22   [name], Gwenlyn Found, MD  potassium chloride SA (KLOR-CON M) 20 MEQ tablet TAKE 1 TABLET BY MOUTH EVERY OTHER DAY 02/12/23   Bensimhon, [name], MD  rosuvastatin (CRESTOR) 40 MG tablet Take 1 tablet (40 mg total) by mouth daily. 07/26/22   Bensimhon, [name], MD  sacubitril-valsartan (ENTRESTO) 49-51 MG Take 1 tablet by mouth 2 (two) times daily. 12/20/22   [name], Gwenlyn Found, MD  spironolactone (ALDACTONE) 25 MG tablet Take 0.5 tablets (12.5 mg total) by mouth daily. 10/27/22   Albertine Grates, MD  tadalafil (CIALIS) 20 MG tablet Take 1 tablet (20 mg total) by mouth daily as needed for erectile dysfunction. 11/21/22   Stoneking, Danford Bad., MD  tamsulosin (FLOMAX) 0.4 MG CAPS  capsule Take 1 capsule (0.4 mg total) by mouth daily after supper. 01/02/23   Stoneking, Danford Bad., MD  torsemide (DEMADEX) 20 MG tablet Take 3 tablets (60 mg total) by mouth daily. 08/13/23   Bensimhon, [name], MD   Inpatient Medications: Scheduled Meds:  Continuous Infusions:  PRN Meds:  Allergies:  Allergies  Allergen Reactions   Farxiga [Dapagliflozin] Other (See Comments)    Patient reports loss of consciousness   Nsaids Anaphylaxis and Rash    Able to tolerate aspirin    Trulicity [Dulaglutide] Nausea And Vomiting   Social History:   Social History   Socioeconomic History   Marital status: Married    Spouse name: Not on file   Number of children: 5   Years of education: Not on file   Highest education level: Not on file  Occupational History   Occupation: Mortician  Tobacco Use   Smoking status: Never   Smokeless tobacco: Never  Vaping Use   Vaping status: Never Used  Substance and Sexual Activity   Alcohol use: No   Drug use: No   Sexual activity: Not on file  Other Topics Concern   Not on file  Social History Narrative   MARRIED, LIVES IN [place] WITH WIFE; GREW UP IN [place] AND USED TO BE A COOK; HE ENJOYS COOKING AND ENJOYS EATING A LOT OF PORK AND SALT.   Social Drivers of Health   Financial Resource Strain: Medium Risk (04/13/2021)   Overall Financial Resource Strain (CARDIA)    Difficulty of Paying Living Expenses: Somewhat hard  Food Insecurity: Low Risk  (08/13/2023)   Received from [facility]   Hunger Vital Sign    Worried About Running Out of Food in the Last Year: Never true    Ran Out of Food in the Last Year: Never true  Transportation Needs: Unmet Transportation Needs (08/13/2023)   Received from [facility]    In the past 12 months, has lack of reliable transportation kept you from medical appointments, meetings, work or from getting things needed for daily living? : Yes  Physical Activity: Not on file   Stress: Not on file  Social Connections: Not on file  Intimate Partner Violence: Not on file    Family History:   Family History  Problem Relation Age of Onset   Heart disease Mother        MI   Heart failure Mother    Diabetes Mother        ALSO IN MOST OF HIS SIBLINGS; 2 UNLCES HAVE ALSO PASSED AWAY FROM DM   Cardiomyopathy Mother    Cancer - Ovarian Mother    Ovarian cancer Mother    Heart disease Father    Hypertension Father    Diabetes Father    Diabetes Sister    Diabetes Brother    Colon cancer Paternal Uncle    Colon cancer Paternal Uncle    Colon polyps Neg Hx    Esophageal cancer Neg Hx    Rectal cancer Neg Hx    Stomach cancer Neg Hx     ROS:  Please see the history of present illness.  All other ROS reviewed and negative.     Physical Exam/Data:   Vitals:   09/23/23 1222 09/23/23 1225 09/23/23 1415  BP:  (!) 123/97 (!) 114/90  Pulse:  96 80  Resp:  16 16  Temp:  98.5 F (36.9 C)   TempSrc:  Oral   SpO2: 97% 94% 98%  Weight:  135.2 kg   Height:  5' 11.5" (1.816 m)    No intake or output data in the 24 hours ending 09/23/23 1608    09/23/2023   12:25 PM 06/15/2023    3:22 PM 06/10/2023   11:59 PM  Last 3 Weights  Weight (lbs)  298 lb 298 lb 285 lb  Weight (kg) 135.172 kg 135.172 kg 129.275 kg     Body mass index is 40.98 kg/m.  General:  Well nourished, well developed, in no acute distress, on room air HEENT: normal Neck: no JVD Vascular: No carotid bruits; Distal pulses 2+ bilaterally Cardiac:  normal S1, S2; RRR; no murmur  Lungs:  clear to auscultation bilaterally, no wheezing, rhonchi or rales  Abd: nontender Ext: no edema on L, BKA on R Skin: warm and dry  Neuro:  no focal abnormalities noted Psych:  Normal affect   EKG:  The EKG was personally reviewed and demonstrates:  sinus rhythm, HR 79 bpm, IVCD  Telemetry:  Telemetry was personally reviewed and demonstrates:  sinus rhythm HR 70-80s  Relevant CV Studies: Echocardiogram  05/29/2023 IMPRESSIONS   1. Left ventricular ejection fraction, by estimation, is 30 to 35%. The  left ventricle has moderately decreased function. The left ventricle  demonstrates regional wall motion abnormalities (see scoring  diagram/findings for description). The left  ventricular internal cavity size was moderately dilated. Left ventricular  diastolic function could not be evaluated.   2. Right ventricular systolic function was not well visualized. The right  ventricular size is not well visualized.   3. The mitral valve is normal in structure. No evidence of mitral valve  regurgitation. No evidence of mitral stenosis.   4. The aortic valve is normal in structure. Aortic valve regurgitation is  not visualized. No aortic stenosis is present.   5. The inferior vena cava is normal in size with greater than 50%  respiratory variability, suggesting right atrial pressure of 3 mmHg.   RHC/LHC 04/12/2021   Prox LAD to Mid LAD lesion is 80% stenosed.   Dist LAD lesion is 99% stenosed.   Dist RCA lesion is 90% stenosed.   Ost 1st Mrg lesion is 95% stenosed.   1st Mrg lesion is 99% stenosed.   Ost 2nd Mrg to 2nd Mrg lesion is 99% stenosed.   Ost RPDA to RPDA lesion is 90% stenosed.   RPAV lesion is 95% stenosed.   Mid LM lesion is 20% stenosed.   Findings:  Ao = 131/84 (107) LV = 131/22 RA = 4 RV = 49/11 PA = 49/17 (31) PCW = 15 Fick cardiac output/index = 6.7/2.7 PVR = 2.4 FA sat = 95% PA sat = 56%, 63%   Assessment: Severe 3v diffuse CAD with moderate progression from previous Ischemic CM EF 25% Mild pulmonary venous HTN with preserved cardiac output   Plan/Discussion:   Films reviewed with interventional team. He has diffuse severe CAD. No clear culprit lesion and no favorable lesion for intervention. Continue medical therapy. Volume status improved. Cardiac output preserved.    Laboratory Data: High Sensitivity Troponin:   Recent Labs  Lab 09/23/23 1250  09/23/23 1445  TROPONINIHS 16 16     Chemistry Recent Labs  Lab 09/23/23 1250  NA 137  K 4.2  CL 103  CO2 22  GLUCOSE 310*  BUN 42*  CREATININE 1.88*  CALCIUM 8.7*  GFRNONAA 42*  ANIONGAP 12    Recent Labs  Lab 09/23/23 1250  PROT 7.3  ALBUMIN 3.7  AST 21  ALT 15  ALKPHOS 73  BILITOT 0.7   Lipids No results for input(s): "CHOL", "TRIG", "HDL", "LABVLDL", "LDLCALC", "CHOLHDL" in the last 168 hours.  Hematology Recent Labs  Lab 09/23/23 1250  WBC 9.1  RBC 4.83  HGB 14.3  HCT 43.6  MCV 90.3  MCH  29.6  MCHC 32.8  RDW 12.3  PLT 137*   Thyroid No results for input(s): "TSH", "FREET4" in the last 168 hours.  BNP Recent Labs  Lab 09/23/23 1250  BNP 99.1    DDimer No results for input(s): "DDIMER" in the last 168 hours.  Radiology/Studies:  DG Chest 2 View Result Date: 09/23/2023 CLINICAL DATA:  Chest pain and shortness of breath. EXAM: CHEST - 2 VIEW COMPARISON:  08/13/2023 FINDINGS: Stable mild cardiomegaly. Single lead pacemaker remains in appropriate position. Both lungs are clear. IMPRESSION: Mild cardiomegaly. No active lung disease. Electronically Signed   By: Danae Orleans M.D.   On: 09/23/2023 14:02   Assessment and Plan:   Chest pain  Severe triple vessel CAD  RHC/LHC from 03/2021: severe 3-vessel disease without any option for revascularization  Patient reported 9/10 chest pain today that was relieved with a single dose of SL nitroglycerin  Troponin negative x 2 Home medications include: DAPT (ASA 81 mg and Plavix 75 mg daily), Crestor 40 mg daily, CoReg 6.25 mg BID Patient denies using Cialis in years or any other PDE5 inhibitors Add Imdur 30 mg daily and SL nitroglycerin PRN for chest pain  Chronic HFrEF Mixed ischemic/nonischemic cardiomyopathy s/p ICD Echo from 05/2023: EF 30-35%, LV RWMA Device check from 07/2023 showed normal function with stable leads/battery Patient denies any shortness of breath  BNP normal  Home medications include:  CoReg 6.25 mg BID, Jardiance 25 mg daily, Entresto 49-51 mg BID, spironolactone 12.5 mg daily, torsemide 60 mg daily with 20 mEq K daily Will make follow up for patient to see advanced HF   Hypertension Current BP 114/90 Home medications as listed above  Hyperlipidemia Home medications include: Crestor 40 mg daily  Per primary Diarrhea OSA Mood disorders Diabetes CKD  History of CVA  Risk Assessment/Risk Scores:     TIMI Risk Score for Unstable Angina or Non-ST Elevation MI:   The patient's TIMI risk score is 3, which indicates a 13% risk of all cause mortality, new or recurrent myocardial infarction or need for urgent revascularization in the next 14 days.  New York Heart Association (NYHA) Functional Class NYHA Class II    For questions or updates, please contact [facility] Please consult [email or web address] for contact info under    Signed, Olena Leatherwood, PA-C  09/23/2023 4:08 PM   I have seen and examined the patient along with Olena Leatherwood, PA-C .  I have reviewed the chart, notes and new data.  I agree with PA/NP's note.  Key new complaints: no recurrence of angina, which resolved with a single NTG tablet. Key examination changes: R BKA, normal CV exam.  Key new findings / data: ECG nondiagnostic due to IVCD, normal cardiac troponin  PLAN: He has known severe multivessel CAD that is not amenable to revascularization. He has generally done well without frequent angina. There are many options to enhance antianginal therapy, for now will add long acting nitrates. He no longer uses PDE5 inhibitors. Discussed the potentially lethal side effects of combining those two types of meds. Will arrange earlier follow up. No other plans for inpatient evaluation or change in treatment at this time.  Thurmon Fair, MD, [facility] [phone] 09/23/2023, 4:38 PM

## 2023-09-23 NOTE — ED Notes (Signed)
 Awaiting patient from lobby.

## 2023-09-23 NOTE — ED Notes (Signed)
 [employer] ICD interrogated.  Awaiting report.

## 2023-09-23 NOTE — ED Notes (Signed)
 Pt ambulated to the restroom and back to bed without assistance and without incident. Pt provided stool sample. Small amount of light brown, liquid stool.

## 2023-09-23 NOTE — H&P (Signed)
 History and Physical    Patient: Mitchell [name]:[id number] DOB: 04-May-1971 DOA: 09/23/2023 DOS: the patient was seen and examined on 09/23/2023 PCP: Pearline Cables, MD  Patient coming from: Home Chief complaint: Chief Complaint  Patient presents with   Chest Pain   Shortness of Breath   Weakness   HPI:  Mitchell [name] is a 53 y.o. male with past medical history  of  male with medical history significant of chronic HFrEF with LVEF 20-25%, ischemic and nonischemic cardiomyopathy, HTN,, CAD/CHF/CVA, HSV, pulmonary hypertension, schizophrenia, syncope, CKD stage II, IDDM, diabetic neuropathy, OSA on CPAP, morbid obesity, comes today with complaints of diarrhea chest pain and shortness of breath.  Patient states he was more concerned with his diarrhea as it has been going on for 3 days or so and he cannot make it to the bathroom in time he has also had stool leakage and incontinence.  Patient states his chest pain started today and is left-sided nonradiating he also reports it was tender to touch and that nitroglycerin relieved the chest pain.  Patient does have a history of right BKA and recently underwent treatment question debridement with Dr. Lajoyce Corners for his toe ulcer.  As far as diarrhea is concerned patient did not report any blood or color change in the diarrhea and reports it is brown and watery.  Overall no complaints of shortness of breath prior to this, no chest pain no palpitations no syncopal episodes falls bleeding or any other complaints otherwise.  >>ED Course: In emergency room alert awake oriented afebrile obese O2 sats of 98% blood pressure as below. Vitals:   09/23/23 1222 09/23/23 1225 09/23/23 1415 09/23/23 1611  BP:  (!) 123/97 (!) 114/90 (!) 133/94  Pulse:  96 80 88  Temp:  98.5 F (36.9 C)    Resp:  16 16 (!) 21  Height:  5' 11.5" (1.816 m)    Weight:  135.2 kg    SpO2: 97% 94% 98% 98%  TempSrc:  Oral    BMI (Calculated):  40.99    ED evaluation  so far  shows: AICD interrogation shows no recent shocks per EDMD. EKG is abnormal sinus rhythm at 93 with left axis deviation ,198 QTc of 517 . CMP shows glucose of 310.8 creatinine 1.88 calcium 9.7 EGFR 42 normal LFTs. EKG shows  sinus rhythm at 93 with left axis deviation ,198 QTc of 517 / BNP 99.1/ Tni of 16 x 2.  CBC wnl.   In the emergency room  pt has received the following treatment thus far: Medications  isosorbide mononitrate (IMDUR) 24 hr tablet 30 mg (has no administration in time range)  nitroGLYCERIN (NITROSTAT) SL tablet 0.4 mg (has no administration in time range)  amitriptyline (ELAVIL) tablet 10 mg (has no administration in time range)  aspirin EC tablet 81 mg (has no administration in time range)  carvedilol (COREG) tablet 6.25 mg (has no administration in time range)  clopidogrel (PLAVIX) tablet 75 mg (has no administration in time range)  tamsulosin (FLOMAX) capsule 0.4 mg (has no administration in time range)  rosuvastatin (CRESTOR) tablet 40 mg (has no administration in time range)  acetaminophen (TYLENOL) tablet 650 mg (has no administration in time range)  heparin injection 5,000 Units (has no administration in time range)  insulin aspart (novoLOG) injection 0-15 Units (has no administration in time range)  pantoprazole (PROTONIX) injection 40 mg (has no administration in time range)     Review of Systems  Respiratory:  Positive  for shortness of breath.   Cardiovascular:  Positive for chest pain.       AICD that did not shock.   Gastrointestinal:  Positive for diarrhea.       3 days and no exposure nonbloody watery diarrhea.  All other systems reviewed and are negative.  Past Medical History:  Diagnosis Date   Bipolar disorder (HCC)    CAD (coronary artery disease)    a. diffuse 3v CAD by cath 2019, medical therapy recommended.   Cataract    forming    Chronic systolic CHF (congestive heart failure) (HCC)    CVA (cerebral infarction)    No residual deficits    Depression    PTSD,    Diabetes mellitus    TYPE II; UNCONTROLLED BY HEMOGLOBIN A1c; STABLE AS  PER DISCHARGE   Headache(784.0)    Herpes simplex of male genitalia    History of colonic polyps    Hyperlipidemia    Hypertension    Myocardial infarction (HCC) 1987   (while playing football)   Obesity    OSA (obstructive sleep apnea)    repeat study 2018 without significant OSA   Pneumonia    Post-cardiac injury syndrome (HCC)    History of cardiac injury from blunt trauma   Pulmonary hypertension (HCC)    a. moderately elevated PASP 07/2019.   PVCs (premature ventricular contractions)    Schizophrenia (HCC)    Goes to [facility]   Sleep apnea    Stroke [facility]) 2005   some left side weakness   Syncope    Recurrent, thought to be vasovagal. Also has h/o frequent PVCs.    Past Surgical History:  Procedure Laterality Date   AMPUTATION Right 01/25/2021   Procedure: RIGHT BELOW KNEE AMPUTATION;  Surgeon: Nadara Mustard, MD;  Location: [facility] OR;  Service: Orthopedics;  Laterality: Right;   CARDIAC CATHETERIZATION  12/19/2010   DIFFUSE NONOBSTRUCTIVE CAD; NONISCHEMIC CARDIOMYOPATHY; LEFT VENTRICULAR ANGIOGRAM WAS PERFORMED SECONDARY TO  ELEVATED LEFT VENTRICULAR FILLING PRESSURES   COLONOSCOPY     ~ age [age]   COLONOSCOPY W/ POLYPECTOMY     I & D EXTREMITY Bilateral 10/24/2019   Procedure: IRRIGATION AND DEBRIDEMENT BILATERAL EXTREMITY WOUND ON FOOT;  Surgeon: Allena Napoleon, MD;  Location: [facility] OR;  Service: Plastics;  Laterality: Bilateral;   ICD IMPLANT N/A 01/13/2022   Procedure: ICD IMPLANT;  Surgeon: Marinus Maw, MD;  Location[facility] INVASIVE CV LAB;  Service: Cardiovascular;  Laterality: N/A;   METATARSAL HEAD EXCISION Right 08/29/2018   Procedure: METATARSAL HEAD RESECTION;  Surgeon: Felecia Shelling, DPM;  Location: [facility] OR;  Service: Podiatry;  Laterality: Right;   METATARSAL OSTEOTOMY Right 08/29/2018   Procedure: SUB FIFTHE METATARSIA RIGHT FOOT;  Surgeon:  Felecia Shelling, DPM;  Location: [facility] OR;  Service: Podiatry;  Laterality: Right;   MULTIPLE EXTRACTIONS WITH ALVEOLOPLASTY  01/27/2014   "all my teeth; 4 Quadrants of alveoloplasty   MULTIPLE EXTRACTIONS WITH ALVEOLOPLASTY N/A 01/27/2014   Procedure: EXTRACTION OF TEETH #'1, 2, 3, 4, 5, 6, 7, 8, 9, 10, 11, 12, 13, 14, 15, 16, 17, 20, 21, 22, 23, 24, 25, 26, 27, 28, 29, 31 and 32 WITH ALVEOLOPLASTY;  Surgeon: Charlynne Pander, DDS;  Location: [facility] OR;  Service: Oral Surgery;  Laterality: N/A;   ORIF FINGER / THUMB FRACTURE Right    POLYPECTOMY     RIGHT HEART CATH N/A 01/27/2021   Procedure: RIGHT HEART CATH;  Surgeon: Dolores Patty, MD;  Location: [facility] INVASIVE CV LAB;  Service: Cardiovascular;  Laterality: N/A;   RIGHT/LEFT HEART CATH AND CORONARY ANGIOGRAPHY N/A 09/26/2017   Procedure: RIGHT/LEFT HEART CATH AND CORONARY ANGIOGRAPHY;  Surgeon: Dolores Patty, MD;  Location: [facility] INVASIVE CV LAB;  Service: Cardiovascular;  Laterality: N/A;   RIGHT/LEFT HEART CATH AND CORONARY ANGIOGRAPHY N/A 04/12/2021   Procedure: RIGHT/LEFT HEART CATH AND CORONARY ANGIOGRAPHY;  Surgeon: Dolores Patty, MD;  Location: [facility] INVASIVE CV LAB;  Service: Cardiovascular;  Laterality: N/A;   SKIN SPLIT GRAFT Bilateral 10/24/2019   Procedure: SKIN GRAFT SPLIT THICKNESS LEFT THIGH;  Surgeon: Allena Napoleon, MD;  Location: [facility] OR;  Service: Plastics;  Laterality: Bilateral;   WOUND DEBRIDEMENT Right 08/29/2018   Procedure: Debridement of ulcer on right fifth metatarsal;  Surgeon: Felecia Shelling, DPM;  Location: [facility] OR;  Service: Podiatry;  Laterality: Right;    reports that he has never smoked. He has never used smokeless tobacco. He reports that he does not drink alcohol and does not use drugs.  Allergies  Allergen Reactions   Farxiga [Dapagliflozin] Other (See Comments)    Patient reports loss of consciousness   Nsaids Anaphylaxis and Rash    Able to tolerate aspirin    Trulicity [Dulaglutide] Nausea And  Vomiting    Family History  Problem Relation Age of Onset   Heart disease Mother        MI   Heart failure Mother    Diabetes Mother        ALSO IN MOST OF HIS SIBLINGS; 2 UNLCES HAVE ALSO PASSED AWAY FROM DM   Cardiomyopathy Mother    Cancer - Ovarian Mother    Ovarian cancer Mother    Heart disease Father    Hypertension Father    Diabetes Father    Diabetes Sister    Diabetes Brother    Colon cancer Paternal Uncle    Colon cancer Paternal Uncle    Colon polyps Neg Hx    Esophageal cancer Neg Hx    Rectal cancer Neg Hx    Stomach cancer Neg Hx     Prior to Admission medications   Medication Sig Start Date End Date Taking? Authorizing Provider  Accu-Chek Softclix Lancets lancets Use to test blood sugars up to 4 times daily as needed. 12/08/21   Orland Mustard, MD  acetaminophen-codeine (TYLENOL #3) 300-30 MG tablet Take 1 tablet by mouth every 6 (six) hours as needed for moderate pain. 06/15/22   [name], Gwenlyn Found, MD  amitriptyline (ELAVIL) 10 MG tablet Take 1 tablet (10 mg total) by mouth at bedtime. 09/20/23   [name], Gwenlyn Found, MD  Ascorbic Acid (VITAMIN C PO) Take 1 tablet by mouth daily.    [provider]  aspirin 81 MG EC tablet Take 1 tablet (81 mg total) by mouth daily. 04/13/21   Danford, Earl Lites, MD  blood glucose meter kit and supplies Dispense based on patient and insurance preference. Use up to four times daily as directed. (FOR ICD-10 E10.9, E11.9). 06/15/22   [name], Gwenlyn Found, MD  Blood Glucose Monitoring Suppl (BLOOD GLUCOSE MONITOR SYSTEM) w/Device KIT Use to test blood sugar up to 4 times daily as needed. 12/08/21   Arby Barrette, MD  carvedilol (COREG) 6.25 MG tablet Take 1 tablet (6.25 mg total) by mouth 2 (two) times daily with a meal. 10/30/22   Bensimhon, [name], MD  Cholecalciferol (VITAMIN D-3 PO) Take 1 capsule by mouth daily.    [provider]  clopidogrel (PLAVIX) 75 MG tablet Take 1 tablet by mouth once daily 09/20/23    Bensimhon, [name], MD  empagliflozin (JARDIANCE) 25 MG TABS tablet Take 1 tablet (25 mg total) by mouth daily before breakfast. Patient not taking: Reported on 06/15/2023 05/21/23   Shamleffer, Konrad Dolores, MD  gabapentin (NEURONTIN) 100 MG capsule Take 1 capsule (100 mg total) by mouth 2 (two) times daily. Patient taking differently: Take 100 mg by mouth every evening. 07/26/22   [name], Gwenlyn Found, MD  glipiZIDE (GLUCOTROL) 5 MG tablet Take 1 tablet (5 mg total) by mouth 2 (two) times daily before a meal. 03/16/23   Shamleffer, Konrad Dolores, MD  glucose blood (ACCU-CHEK GUIDE) test strip Use to test blood sugars up to 4 times daily as needed. 12/08/21   Orland Mustard, MD  Insulin Glargine [facility]) 100 UNIT/ML Inject 36 Units into the skin daily. 03/16/23   Shamleffer, Konrad Dolores, MD  Insulin Pen Needle (BD PEN NEEDLE MICRO U/F) 32G X 6 MM MISC 1 Device by Does not apply route daily in the afternoon. 03/16/23   Shamleffer, Konrad Dolores, MD  methocarbamol (ROBAXIN) 500 MG tablet TAKE 1 TABLET BY MOUTH EVERY 6 HOURS AS NEEDED FOR MUSCLE SPASM 12/11/22   [name], Gwenlyn Found, MD  pantoprazole (PROTONIX) 40 MG tablet Take 1 tablet (40 mg total) by mouth daily as needed (acid reflux). 02/13/22   [name], Gwenlyn Found, MD  potassium chloride SA (KLOR-CON M) 20 MEQ tablet TAKE 1 TABLET BY MOUTH EVERY OTHER DAY 02/12/23   Bensimhon, [name], MD  rosuvastatin (CRESTOR) 40 MG tablet Take 1 tablet (40 mg total) by mouth daily. 07/26/22   Bensimhon, [name], MD  sacubitril-valsartan (ENTRESTO) 49-51 MG Take 1 tablet by mouth 2 (two) times daily. 12/20/22   [name], Gwenlyn Found, MD  spironolactone (ALDACTONE) 25 MG tablet Take 0.5 tablets (12.5 mg total) by mouth daily. 10/27/22   Albertine Grates, MD  tadalafil (CIALIS) 20 MG tablet Take 1 tablet (20 mg total) by mouth daily as needed for erectile dysfunction. 11/21/22   Stoneking, Danford Bad., MD  tamsulosin (FLOMAX) 0.4 MG CAPS capsule Take 1 capsule (0.4  mg total) by mouth daily after supper. 01/02/23   Stoneking, Danford Bad., MD  torsemide (DEMADEX) 20 MG tablet Take 3 tablets (60 mg total) by mouth daily. 08/13/23   Bensimhon, [name], MD     Vitals:   09/23/23 1222 09/23/23 1225 09/23/23 1415 09/23/23 1611  BP:  (!) 123/97 (!) 114/90 (!) 133/94  Pulse:  96 80 88  Resp:  16 16 (!) 21  Temp:  98.5 F (36.9 C)    TempSrc:  Oral    SpO2: 97% 94% 98% 98%  Weight:  135.2 kg    Height:  5' 11.5" (1.816 m)     Physical Exam Vitals and nursing note reviewed.  Constitutional:      General: He is not in acute distress. HENT:     Head: Normocephalic and atraumatic.     Right Ear: Hearing normal.     Left Ear: Hearing normal.     Nose: Nose normal. No nasal deformity.     Mouth/Throat:     Lips: Pink.     Tongue: No lesions.     Pharynx: Oropharynx is clear.  Eyes:     General: Lids are normal.     Extraocular Movements: Extraocular movements intact.  Cardiovascular:     Rate and Rhythm: Normal rate and regular rhythm.  Heart sounds: Normal heart sounds.  Pulmonary:     Effort: Pulmonary effort is normal.     Breath sounds: Normal breath sounds.  Abdominal:     General: Bowel sounds are normal. There is no distension.     Palpations: Abdomen is soft. There is no mass.     Tenderness: There is no abdominal tenderness.  Musculoskeletal:     Left lower leg: No edema.     Right Lower Extremity: Right leg is amputated below knee.  Skin:    General: Skin is warm.  Neurological:     General: No focal deficit present.     Mental Status: He is alert and oriented to person, place, and time.     Cranial Nerves: Cranial nerves 2-12 are intact.     Motor: Motor function is intact.  Psychiatric:        Attention and Perception: Attention normal.        Speech: Speech normal.        Behavior: Behavior is cooperative.      Labs on Admission: I have personally reviewed following labs and imaging studies Results for orders placed or  performed during the hospital encounter of 09/23/23 (from the past 24 hours)  Comprehensive metabolic panel     Status: Abnormal   Collection Time: 09/23/23 12:50 PM  Result Value Ref Range   Sodium 137 135 - 145 mmol/L   Potassium 4.2 3.5 - 5.1 mmol/L   Chloride 103 98 - 111 mmol/L   CO2 22 22 - 32 mmol/L   Glucose, Bld 310 (H) 70 - 99 mg/dL   BUN 42 (H) 6 - 20 mg/dL   Creatinine, Ser 9.14 (H) 0.61 - 1.24 mg/dL   Calcium 8.7 (L) 8.9 - 10.3 mg/dL   Total Protein 7.3 6.5 - 8.1 g/dL   Albumin 3.7 3.5 - 5.0 g/dL   AST 21 15 - 41 U/L   ALT 15 0 - 44 U/L   Alkaline Phosphatase 73 38 - 126 U/L   Total Bilirubin 0.7 0.0 - 1.2 mg/dL   GFR, Estimated 42 (L) >60 mL/min   Anion gap 12 5 - 15  Brain natriuretic peptide     Status: None   Collection Time: 09/23/23 12:50 PM  Result Value Ref Range   B Natriuretic Peptide 99.1 0.0 - 100.0 pg/mL  Troponin I (High Sensitivity)     Status: None   Collection Time: 09/23/23 12:50 PM  Result Value Ref Range   Troponin I (High Sensitivity) 16 <18 ng/L  CBC with Differential     Status: Abnormal   Collection Time: 09/23/23 12:50 PM  Result Value Ref Range   WBC 9.1 4.0 - 10.5 K/uL   RBC 4.83 4.22 - 5.81 MIL/uL   Hemoglobin 14.3 13.0 - 17.0 g/dL   HCT 78.2 95.6 - 21.3 %   MCV 90.3 80.0 - 100.0 fL   MCH 29.6 26.0 - 34.0 pg   MCHC 32.8 30.0 - 36.0 g/dL   RDW 08.6 57.8 - 46.9 %   Platelets 137 (L) 150 - 400 K/uL   nRBC 0.0 0.0 - 0.2 %   Neutrophils Relative % 75 %   Neutro Abs 6.9 1.7 - 7.7 K/uL   Lymphocytes Relative 16 %   Lymphs Abs 1.4 0.7 - 4.0 K/uL   Monocytes Relative 6 %   Monocytes Absolute 0.6 0.1 - 1.0 K/uL   Eosinophils Relative 2 %   Eosinophils Absolute 0.1 0.0 - 0.5 K/uL  Basophils Relative 0 %   Basophils Absolute 0.0 0.0 - 0.1 K/uL   Immature Granulocytes 1 %   Abs Immature Granulocytes 0.05 0.00 - 0.07 K/uL  Resp panel by RT-PCR (RSV, Flu A&B, Covid) Anterior Nasal Swab     Status: None   Collection Time: 09/23/23 12:50  PM   Specimen: Anterior Nasal Swab  Result Value Ref Range   SARS Coronavirus 2 by RT PCR NEGATIVE NEGATIVE   Influenza A by PCR NEGATIVE NEGATIVE   Influenza B by PCR NEGATIVE NEGATIVE   Resp Syncytial Virus by PCR NEGATIVE NEGATIVE  Troponin I (High Sensitivity)     Status: None   Collection Time: 09/23/23  2:45 PM  Result Value Ref Range   Troponin I (High Sensitivity) 16 <18 ng/L   *Note: Due to a large number of results and/or encounters for the requested time period, some results have not been displayed. A complete set of results can be found in Results Review.   Recent Results (from the past 720 hours)  Resp panel by RT-PCR (RSV, Flu A&B, Covid) Anterior Nasal Swab     Status: None   Collection Time: 09/23/23 12:50 PM   Specimen: Anterior Nasal Swab  Result Value Ref Range Status   SARS Coronavirus 2 by RT PCR NEGATIVE NEGATIVE Final   Influenza A by PCR NEGATIVE NEGATIVE Final   Influenza B by PCR NEGATIVE NEGATIVE Final    Comment: (NOTE) The Xpert Xpress SARS-CoV-2/FLU/RSV plus assay is intended as an aid in the diagnosis of influenza from Nasopharyngeal swab specimens and should not be used as a sole basis for treatment. Nasal washings and aspirates are unacceptable for Xpert Xpress SARS-CoV-2/FLU/RSV testing.  Fact Sheet for Patients: BloggerCourse.com  Fact Sheet for Healthcare Providers: SeriousBroker.it  This test is not yet approved or cleared by the [place] FDA and has been authorized for detection and/or diagnosis of SARS-CoV-2 by FDA under an Emergency Use Authorization (EUA). This EUA will remain in effect (meaning this test can be used) for the duration of the COVID-19 declaration under Section 564(b)(1) of the Act, 21 U.S.C. section 360bbb-3(b)(1), unless the authorization is terminated or revoked.     Resp Syncytial Virus by PCR NEGATIVE NEGATIVE Final    Comment: (NOTE) Fact Sheet for  Patients: BloggerCourse.com  Fact Sheet for Healthcare Providers: SeriousBroker.it  This test is not yet approved or cleared by the [place] FDA and has been authorized for detection and/or diagnosis of SARS-CoV-2 by FDA under an Emergency Use Authorization (EUA). This EUA will remain in effect (meaning this test can be used) for the duration of the COVID-19 declaration under Section 564(b)(1) of the Act, 21 U.S.C. section 360bbb-3(b)(1), unless the authorization is terminated or revoked.  Performed at [facility], [address], [place], [place] [address]    CBC:    Latest Ref Rng & Units 09/23/2023   12:50 PM 06/10/2023    9:47 PM 05/29/2023    9:58 AM  CBC  WBC 4.0 - 10.5 K/uL 9.1  8.7  9.8   Hemoglobin 13.0 - 17.0 g/dL 60.4  54.0  98.1   Hematocrit 39.0 - 52.0 % 43.6  39.7  42.5   Platelets 150 - 400 K/uL 137  125  175    Basic Metabolic Panel: Recent Labs  Lab 09/23/23 1250  NA 137  K 4.2  CL 103  CO2 22  GLUCOSE 310*  BUN 42*  CREATININE 1.88*  CALCIUM 8.7*   Creatinine:  Lab Results  Component Value Date   CREATININE 1.88 (H) 09/23/2023   CREATININE 1.45 (H) 06/10/2023   CREATININE 1.67 (H) 05/29/2023   Liver Function Tests:    Latest Ref Rng & Units 09/23/2023   12:50 PM 01/22/2023   10:58 AM 11/08/2022   12:27 PM  Hepatic Function  Total Protein 6.5 - 8.1 g/dL 7.3  7.2  7.6   Albumin 3.5 - 5.0 g/dL 3.7  4.1  4.4   AST 15 - 41 U/L 21  17  15    ALT 0 - 44 U/L 15  10  9    Alk Phosphatase 38 - 126 U/L 73  71  67   Total Bilirubin 0.0 - 1.2 mg/dL 0.7  0.3  0.3    Coagulation Profile: No results for input(s): "INR", "PROTIME" in the last 168 hours. Cardiac Enzymes: No results for input(s): "CKTOTAL", "CKMB", "CKMBINDEX", "TROPONINI" in the last 168 hours. BNP (last 3 results) No results for input(s): "PROBNP" in the last 8760 hours. HbA1C: No results for input(s): "HGBA1C" in the last 72  hours. Lipid Profile: No results for input(s): "CHOL", "HDL", "LDLCALC", "TRIG", "CHOLHDL", "LDLDIRECT" in the last 72 hours.  Radiological Exams on Admission: DG Chest 2 View Result Date: 09/23/2023 CLINICAL DATA:  Chest pain and shortness of breath. EXAM: CHEST - 2 VIEW COMPARISON:  08/13/2023 FINDINGS: Stable mild cardiomegaly. Single lead pacemaker remains in appropriate position. Both lungs are clear. IMPRESSION: Mild cardiomegaly. No active lung disease. Electronically Signed   By: Danae Orleans M.D.   On: 09/23/2023 14:02    Data Reviewed: Relevant notes from primary care and specialist visits, past discharge summaries as available in EHR, including Care Everywhere. Prior diagnostic testing as pertinent to current admission diagnoses, Updated medications and problem lists for reconciliation ED course, including vitals, labs, imaging, treatment and response to treatment,Triage notes, nursing and pharmacy notes and ED provider's notes Notable results as noted in HPI.Discussed case with EDMD/ ED APP/ or Specialty MD on call and as needed.  Assessment and Plan: >>Chest pain / Cad/ CHF/ AICD : D/D include cad or VTE related and also possibly PUD related.  PRN NTG, DAPT, Crestor. TNI negative so far and EKG no st t wave changes.  Entresto held due to creatinine of 1.8 / soft BP. V/Q scan for PE d/d. Appreciate cardiology consult.  IV PPI.  L/E venous doppler.    >> Diarrhea: Stool studies/ occult/ pcr/ c. Diff.  Probiotics.    >>DM II: Glycemic protocol.  NPO after MN. Jardiance held due to soft BP.    >>Essential HTN: Vitals:   09/23/23 1225 09/23/23 1415 09/23/23 1611  BP: (!) 123/97 (!) 114/90 (!) 133/94  Cont Coreg / imdur added per cardiology . Aldactone held as BP is soft .   DVT prophylaxis:  Heparin.  Consults:  Cardiology.  Advance Care Planning:    Code Status: Full Code   Family Communication:  None  Disposition Plan:  Home  Severity of  Illness: The appropriate patient status for this patient is INPATIENT. Inpatient status is judged to be reasonable and necessary in order to provide the required intensity of service to ensure the patient's safety. The patient's presenting symptoms, physical exam findings, and initial radiographic and laboratory data in the context of their chronic comorbidities is felt to place them at high risk for further clinical deterioration. Furthermore, it is not anticipated that the patient will be medically stable for discharge from the hospital within 2 midnights of  admission.   * I certify that at the point of admission it is my clinical judgment that the patient will require inpatient hospital care spanning beyond 2 midnights from the point of admission due to high intensity of service, high risk for further deterioration and high frequency of surveillance required.*  Author: Gertha Calkin, MD 09/23/2023 4:46 PM  For on call review [email or web address].   Unresulted Labs (From admission, onward)     Start     Ordered   09/23/23 1626  CBC  (heparin)  Once,   R       Comments: Baseline for heparin therapy IF NOT ALREADY DRAWN.  Notify MD if PLT < 100 K.    09/23/23 1627   09/23/23 1626  Creatinine, serum  (heparin)  Once,   R       Comments: Baseline for heparin therapy IF NOT ALREADY DRAWN.    09/23/23 1627   09/23/23 1530  Gastrointestinal Panel by PCR , Stool  (Gastrointestinal Panel by PCR, Stool                                                                                                                                                     **Does Not include CLOSTRIDIUM DIFFICILE testing. **If CDIFF testing is needed, place order from the "C Difficile Testing" order set.**)  Once,   URGENT        09/23/23 1529   09/23/23 1530  C Difficile Quick Screen w PCR reflex  (C Difficile quick screen w PCR reflex panel )  Once, for 24 hours,   URGENT       References:    CDiff Information Tool   09/23/23 1529    Unscheduled  Occult blood card to lab, stool  As needed,   R      09/23/23 1529            Orders Placed This Encounter  Procedures   Resp panel by RT-PCR (RSV, Flu A&B, Covid) Anterior Nasal Swab   Gastrointestinal Panel by PCR , Stool   C Difficile Quick Screen w PCR reflex   DG Chest 2 View   NM Pulmonary Perf and Vent   Comprehensive metabolic panel   Brain natriuretic peptide   CBC with Differential   Occult blood card to lab, stool   CBC   Creatinine, serum   Diet Carb Modified Fluid consistency: Thin; Room service appropriate? Yes   Nursing Communication Interrogate ICD if possible please. Thanks!   Refer to Sidebar Report Refer to ICU, Med-Surg, Progressive, and Step-Down Mobility Protocol Sidebars   Apply Angina, Rule Out Myocardial Infarction Care Plan   Vital signs with O2 sat q4 hours x 24 hours, then q shift   Cardiac Monitoring Continuous x 24 hours Indications for use: Other; other indications for use: chest pain  RN may order Cardiology PRN Orders utilizing "Cardiology PRN medications" (through manage orders) for the following patient needs:   Mobility Protocol: No Restrictions RN to initiate protocols based on patient's level of care   Apply Diabetes Mellitus Care Plan   STAT CBG when hypoglycemia is suspected. If treated, recheck every 15 minutes after each treatment until CBG >/= 70 mg/dl   Refer to Hypoglycemia Protocol Sidebar Report for treatment of CBG < 70 mg/dl   No HS correction Insulin   Full code   Inpatient consult to Cardiology   Consult to hospitalist   Enteric precautions (UV disinfection)   EKG 12-Lead   ED EKG   EKG 12-Lead   EKG 12-Lead (at 6am)   Admit to Inpatient (patient's expected length of stay will be greater than 2 midnights or inpatient only procedure)   VAS [place] LOWER EXTREMITY VENOUS (DVT)

## 2023-09-23 NOTE — ED Triage Notes (Signed)
 [place]. Pt suddenly felt weak in his legs with chest pain/sob. Pt called EMS, when ems arrived pt received 324 ASA and 1 nitroglycerin with some relief of his pain. Pt has ICD in place, denies any shocks. Pt did not take any of his usual meds today due to also dealing with diarrhea for the past 3 days.

## 2023-09-23 NOTE — ED Provider Triage Note (Signed)
 Emergency Medicine Provider Triage Evaluation Note  Mitchell [name] , a 53 y.o. male  was evaluated in triage.  Pt complains of chest pain, diarrhea.  Patient with history of prior stroke, CAD, ICD in place, systolic heart failure, pulmonary hypertension present for diarrhea, chest pain.  He states he has had watery nonbloody diarrhea for last few days.  No melena.  No vomiting or abdominal pain.  Today he was in the store developed sudden onset substernal chest pain.  Nonradiating.  No dizziness or palpitations or syncope.  He was given nitro with EMS and symptoms improved but he still has mild chest pain.  He is otherwise asymptomatic.  Denies any recent antibiotic use.  Review of Systems  Positive: Chest pain, diarrhea Negative: Shortness of breath, abdominal pain, fever, vomiting  Physical Exam  BP (!) 123/97   Pulse 96   Temp 98.5 F (36.9 C) (Oral)   Resp 16   Ht 5' 11.5" (1.816 m)   Wt 135.2 kg   SpO2 94%   BMI 40.98 kg/m  Gen:   Awake, no distress   Resp:  Normal effort  MSK:   Moves extremities without difficulty  Other:  Benign abdominal exam  Medical Decision Making  Medically screening exam initiated at 12:42 PM.  Appropriate orders placed.  Mitchell [name] was informed that the remainder of the evaluation will be completed by another provider, this initial triage assessment does not replace that evaluation, and the importance of remaining in the ED until their evaluation is complete.  Patient was noted cardiac history presenting for chest pain in setting of recent diarrhea.  He has benign abdominal exam.  I am more concerned regarding his chest pain given improved with nitro and persistent pain.  We will work on room for him for further evaluation.  Orders placed.   Laurence Spates, MD 09/23/23 [phone]

## 2023-09-23 NOTE — ED Provider Notes (Signed)
 [place] EMERGENCY DEPARTMENT AT [facility] Provider Note   CSN: [id number] Arrival date & time: 09/23/23  1214     History  Chief Complaint  Patient presents with   Chest Pain   Shortness of Breath   Weakness    Mitchell [name] is a 53 y.o. male.  The history is provided by the patient and medical records. No language interpreter was used.  Chest Pain Pain location:  L chest and substernal area Pain quality: aching, crushing and pressure   Pain radiates to:  Does not radiate Pain severity:  Severe Onset quality:  Sudden Duration:  10 minutes Timing:  Constant Progression:  Resolved Chronicity:  New Relieved by:  Nitroglycerin Worsened by:  Exertion Ineffective treatments:  None tried Associated symptoms: shortness of breath and weakness   Associated symptoms: no abdominal pain, no altered mental status, no anxiety, no back pain, no claudication, no cough, no fatigue, no fever, no headache, no lower extremity edema, no nausea, no near-syncope, no numbness, no palpitations, no syncope and no vomiting   Shortness of Breath Associated symptoms: chest pain   Associated symptoms: no abdominal pain, no claudication, no cough, no fever, no headaches, no neck pain, no rash, no syncope and no vomiting   Weakness Associated symptoms: chest pain, diarrhea and shortness of breath   Associated symptoms: no abdominal pain, no cough, no dysuria, no fever, no headaches, no nausea, no near-syncope, no syncope and no vomiting        Home Medications Prior to Admission medications   Medication Sig Start Date End Date Taking? Authorizing Provider  Accu-Chek Softclix Lancets lancets Use to test blood sugars up to 4 times daily as needed. 12/08/21   Orland Mustard, MD  acetaminophen-codeine (TYLENOL #3) 300-30 MG tablet Take 1 tablet by mouth every 6 (six) hours as needed for moderate pain. 06/15/22   [name], Gwenlyn Found, MD  amitriptyline (ELAVIL) 10 MG tablet Take 1 tablet  (10 mg total) by mouth at bedtime. 09/20/23   [name], Gwenlyn Found, MD  Ascorbic Acid (VITAMIN C PO) Take 1 tablet by mouth daily.    [provider]  aspirin 81 MG EC tablet Take 1 tablet (81 mg total) by mouth daily. 04/13/21   Danford, Earl Lites, MD  blood glucose meter kit and supplies Dispense based on patient and insurance preference. Use up to four times daily as directed. (FOR ICD-10 E10.9, E11.9). 06/15/22   [name], Gwenlyn Found, MD  Blood Glucose Monitoring Suppl (BLOOD GLUCOSE MONITOR SYSTEM) w/Device KIT Use to test blood sugar up to 4 times daily as needed. 12/08/21   Arby Barrette, MD  carvedilol (COREG) 6.25 MG tablet Take 1 tablet (6.25 mg total) by mouth 2 (two) times daily with a meal. 10/30/22   Bensimhon, [name], MD  Cholecalciferol (VITAMIN D-3 PO) Take 1 capsule by mouth daily.    [provider]  clopidogrel (PLAVIX) 75 MG tablet Take 1 tablet by mouth once daily 09/20/23   Bensimhon, [name], MD  empagliflozin (JARDIANCE) 25 MG TABS tablet Take 1 tablet (25 mg total) by mouth daily before breakfast. Patient not taking: Reported on 06/15/2023 05/21/23   Shamleffer, Konrad Dolores, MD  gabapentin (NEURONTIN) 100 MG capsule Take 1 capsule (100 mg total) by mouth 2 (two) times daily. Patient taking differently: Take 100 mg by mouth every evening. 07/26/22   [name], Gwenlyn Found, MD  glipiZIDE (GLUCOTROL) 5 MG tablet Take 1 tablet (5 mg total) by mouth 2 (two)  times daily before a meal. 03/16/23   Shamleffer, Konrad Dolores, MD  glucose blood (ACCU-CHEK GUIDE) test strip Use to test blood sugars up to 4 times daily as needed. 12/08/21   Orland Mustard, MD  Insulin Glargine [facility]) 100 UNIT/ML Inject 36 Units into the skin daily. 03/16/23   Shamleffer, Konrad Dolores, MD  Insulin Pen Needle (BD PEN NEEDLE MICRO U/F) 32G X 6 MM MISC 1 Device by Does not apply route daily in the afternoon. 03/16/23   Shamleffer, Konrad Dolores, MD  methocarbamol (ROBAXIN) 500  MG tablet TAKE 1 TABLET BY MOUTH EVERY 6 HOURS AS NEEDED FOR MUSCLE SPASM 12/11/22   [name], Gwenlyn Found, MD  pantoprazole (PROTONIX) 40 MG tablet Take 1 tablet (40 mg total) by mouth daily as needed (acid reflux). 02/13/22   [name], Gwenlyn Found, MD  potassium chloride SA (KLOR-CON M) 20 MEQ tablet TAKE 1 TABLET BY MOUTH EVERY OTHER DAY 02/12/23   Bensimhon, [name], MD  rosuvastatin (CRESTOR) 40 MG tablet Take 1 tablet (40 mg total) by mouth daily. 07/26/22   Bensimhon, [name], MD  sacubitril-valsartan (ENTRESTO) 49-51 MG Take 1 tablet by mouth 2 (two) times daily. 12/20/22   [name], Gwenlyn Found, MD  spironolactone (ALDACTONE) 25 MG tablet Take 0.5 tablets (12.5 mg total) by mouth daily. 10/27/22   Albertine Grates, MD  tadalafil (CIALIS) 20 MG tablet Take 1 tablet (20 mg total) by mouth daily as needed for erectile dysfunction. 11/21/22   Stoneking, Danford Bad., MD  tamsulosin (FLOMAX) 0.4 MG CAPS capsule Take 1 capsule (0.4 mg total) by mouth daily after supper. 01/02/23   Stoneking, Danford Bad., MD  torsemide (DEMADEX) 20 MG tablet Take 3 tablets (60 mg total) by mouth daily. 08/13/23   Bensimhon, [name], MD      Allergies    Marcelline Deist [dapagliflozin], Nsaids, and Trulicity [dulaglutide]    Review of Systems   Review of Systems  Constitutional:  Negative for chills, fatigue and fever.  HENT:  Negative for congestion.   Respiratory:  Positive for shortness of breath. Negative for cough and chest tightness.   Cardiovascular:  Positive for chest pain. Negative for palpitations, claudication, syncope and near-syncope.  Gastrointestinal:  Positive for diarrhea. Negative for abdominal pain, constipation, nausea and vomiting.  Genitourinary:  Negative for dysuria and flank pain.  Musculoskeletal:  Negative for back pain and neck pain.  Skin:  Negative for rash.  Neurological:  Positive for weakness. Negative for light-headedness, numbness and headaches.  Psychiatric/Behavioral:  Negative for agitation.   All  other systems reviewed and are negative.   Physical Exam Updated Vital Signs BP (!) 123/97   Pulse 96   Temp 98.5 F (36.9 C) (Oral)   Resp 16   Ht 5' 11.5" (1.816 m)   Wt 135.2 kg   SpO2 94%   BMI 40.98 kg/m  Physical Exam Vitals and nursing note reviewed.  Constitutional:      General: He is not in acute distress.    Appearance: He is well-developed. He is not ill-appearing, toxic-appearing or diaphoretic.  HENT:     Head: Normocephalic and atraumatic.  Eyes:     Conjunctiva/sclera: Conjunctivae normal.     Pupils: Pupils are equal, round, and reactive to light.  Cardiovascular:     Rate and Rhythm: Normal rate and regular rhythm.     Heart sounds: Normal heart sounds. No murmur heard. Pulmonary:     Effort: Pulmonary effort is normal. No respiratory distress.  Breath sounds: Normal breath sounds. No wheezing, rhonchi or rales.  Abdominal:     Palpations: Abdomen is soft.     Tenderness: There is no abdominal tenderness.  Musculoskeletal:        General: No swelling.     Cervical back: Neck supple.     Left lower leg: No tenderness. No edema.     Comments: R leg prosthesis  Skin:    General: Skin is warm and dry.     Capillary Refill: Capillary refill takes less than 2 seconds.  Neurological:     General: No focal deficit present.     Mental Status: He is alert.  Psychiatric:        Mood and Affect: Mood normal.     ED Results / Procedures / Treatments   Labs (all labs ordered are listed, but only abnormal results are displayed) Labs Reviewed  COMPREHENSIVE METABOLIC PANEL - Abnormal; Notable for the following components:      Result Value   Glucose, Bld 310 (*)    BUN 42 (*)    Creatinine, Ser 1.88 (*)    Calcium 8.7 (*)    GFR, Estimated 42 (*)    All other components within normal limits  CBC WITH DIFFERENTIAL/PLATELET - Abnormal; Notable for the following components:   Platelets 137 (*)    All other components within normal limits  RESP PANEL  BY RT-PCR (RSV, FLU A&B, COVID)  [id number]  GASTROINTESTINAL PANEL BY PCR, STOOL (REPLACES STOOL CULTURE)  C DIFFICILE QUICK SCREEN W PCR REFLEX    BRAIN NATRIURETIC PEPTIDE  TROPONIN I (HIGH SENSITIVITY)  TROPONIN I (HIGH SENSITIVITY)    EKG EKG Interpretation Date/Time:  Sunday September 23 2023 12:29:53 EST Ventricular Rate:  93 PR Interval:  198 QRS Duration:  138 QT Interval:  416 QTC Calculation: 517 R Axis:   -55  Text Interpretation: Normal sinus rhythm Left axis deviation Left ventricular hypertrophy with QRS widening and repolarization abnormality ( R in aVL , Cornell product ) Possible Anterolateral infarct , age undetermined Abnormal ECG When compared with ECG of 20-Nov-2022 11:50, PREVIOUS ECG IS PRESENT No significant change since last tracing Confirmed by Fulton Reek [phone]) on 09/23/2023 12:44:58 PM  Radiology DG Chest 2 View Result Date: 09/23/2023 CLINICAL DATA:  Chest pain and shortness of breath. EXAM: CHEST - 2 VIEW COMPARISON:  08/13/2023 FINDINGS: Stable mild cardiomegaly. Single lead pacemaker remains in appropriate position. Both lungs are clear. IMPRESSION: Mild cardiomegaly. No active lung disease. Electronically Signed   By: Danae Orleans M.D.   On: 09/23/2023 14:02    Procedures Procedures    Medications Ordered in ED Medications - No data to display  ED Course/ Medical Decision Making/ A&P                                 Medical Decision Making Risk Decision regarding hospitalization.    COPE MARTE is a 53 y.o. male with a past medical history significant for CHF with ICD managed by Dr. Gala Romney and heart failure team, obesity, hypertension, three-vessel CAD, diabetes, sleep apnea, previous stroke, and bipolar disease/schizophrenia who presents with acute chest pain.  According to patient, he has had diarrhea for the last 3 days and that he could be so dehydrated.  He was [place] the flea market today and when ambulating to the bathroom had  sudden onset of severe 9 out of 10 crushing aching chest  pain in his left central chest.  It did not radiate but he felt very ill.  Someone said they saw him and thought he was going to pass out.  He was sat down and symptoms started to slightly improved.  He denied diaphoresis shortness of breath or syncope but was given nitro with EMS and symptoms have drastically improved.  He is now chest pain-free.  He reports he does not have palpitations nausea or vomiting.  Denies constipation or urinary changes.  Denies recent antibiotic use or any sick contacts with other diarrheal illnesses such as C. difficile.  EKG on arrival does not show STEMI.  On exam, lungs were clear and chest nontender.  Cannot reproduce his discomfort.  Abdomen nontender.  Good pulses in extremities.  He does have a right leg prosthesis.  Patient otherwise resting with reassuring vital signs on my evaluation.  Patient had workup starting in triage including a negative viral swab with no COVID, flu, RSV.  BNP not elevated.  Initial troponin normal, will trend.  CBC and CMP shows increase in his creatinine but otherwise reassuring CBC.  Although I do suspect some dehydration with his reported 3 days of diarrhea, I am somewhat concerned about this severe chest pain in his left chest that was worsened with exertion and improved with nitro and rest context of his history.  Will call cardiology and get recommendations to see if patient will need to be admitted for this high risk chest pain episode.  2:54 PM Spoke to cardiology with Dr. Birdena Crandall who reviewed the case.  He would like repeat EKG, cycle of troponins at least 2 more times, they will see him, and feel he needs a medicine admission as well.  Will call hospitalist service for admission.  He remains chest pain-free at this time.         Final Clinical Impression(s) / ED Diagnoses Final diagnoses:  Chest pain, unspecified type  Diarrhea, unspecified type     Clinical  Impression: 1. Chest pain, unspecified type   2. Diarrhea, unspecified type     Disposition: Admit  This note was prepared with assistance of Dragon voice recognition software. Occasional wrong-word or sound-a-like substitutions may have occurred due to the inherent limitations of voice recognition software.     Sreekar Broyhill, Canary Brim, MD 09/23/23 [phone]

## 2023-09-24 ENCOUNTER — Inpatient Hospital Stay (HOSPITAL_COMMUNITY): Payer: Self-pay

## 2023-09-24 DIAGNOSIS — I5022 Chronic systolic (congestive) heart failure: Secondary | ICD-10-CM

## 2023-09-24 DIAGNOSIS — R079 Chest pain, unspecified: Secondary | ICD-10-CM

## 2023-09-24 DIAGNOSIS — E7800 Pure hypercholesterolemia, unspecified: Secondary | ICD-10-CM

## 2023-09-24 DIAGNOSIS — I2510 Atherosclerotic heart disease of native coronary artery without angina pectoris: Secondary | ICD-10-CM

## 2023-09-24 DIAGNOSIS — I2583 Coronary atherosclerosis due to lipid rich plaque: Secondary | ICD-10-CM

## 2023-09-24 LAB — CBC
HCT: 42 % (ref 39.0–52.0)
Hemoglobin: 13.9 g/dL (ref 13.0–17.0)
MCH: 29.8 pg (ref 26.0–34.0)
MCHC: 33.1 g/dL (ref 30.0–36.0)
MCV: 90.1 fL (ref 80.0–100.0)
Platelets: 118 10*3/uL — ABNORMAL LOW (ref 150–400)
RBC: 4.66 MIL/uL (ref 4.22–5.81)
RDW: 12.5 % (ref 11.5–15.5)
WBC: 7.6 10*3/uL (ref 4.0–10.5)
nRBC: 0 % (ref 0.0–0.2)

## 2023-09-24 LAB — BASIC METABOLIC PANEL
Anion gap: 10 (ref 5–15)
BUN: 40 mg/dL — ABNORMAL HIGH (ref 6–20)
CO2: 23 mmol/L (ref 22–32)
Calcium: 9 mg/dL (ref 8.9–10.3)
Chloride: 104 mmol/L (ref 98–111)
Creatinine, Ser: 1.69 mg/dL — ABNORMAL HIGH (ref 0.61–1.24)
GFR, Estimated: 47 mL/min — ABNORMAL LOW (ref >60)
Glucose, Bld: 275 mg/dL — ABNORMAL HIGH (ref 70–99)
Potassium: 3.8 mmol/L (ref 3.5–5.1)
Sodium: 137 mmol/L (ref 135–145)

## 2023-09-24 LAB — GASTROINTESTINAL PANEL BY PCR, STOOL (REPLACES STOOL CULTURE)

## 2023-09-24 LAB — MAGNESIUM: Magnesium: 2.2 mg/dL (ref 1.7–2.4)

## 2023-09-24 LAB — CBG MONITORING, ED: Glucose-Capillary: 255 mg/dL — ABNORMAL HIGH (ref 70–99)

## 2023-09-24 MED ORDER — NITROGLYCERIN 0.4 MG SL SUBL: 0.4000 mg | SUBLINGUAL_TABLET | SUBLINGUAL | 0 refills | Status: AC | PRN

## 2023-09-24 MED ORDER — LOPERAMIDE HCL 2 MG PO CAPS: 2.0000 mg | ORAL_CAPSULE | ORAL | 0 refills | Status: DC | PRN

## 2023-09-24 MED ORDER — ISOSORBIDE MONONITRATE ER 30 MG PO TB24: 30.0000 mg | ORAL_TABLET | Freq: Every day | ORAL | 1 refills | Status: AC

## 2023-09-24 MED ORDER — LOPERAMIDE HCL 2 MG PO CAPS
2.0000 mg | ORAL_CAPSULE | ORAL | Status: DC | PRN
Start: 2023-09-24 — End: 2023-09-24

## 2023-09-24 MED ORDER — TORSEMIDE 20 MG PO TABS: 60.0000 mg | ORAL_TABLET | Freq: Every day | ORAL | 3 refills | Status: DC

## 2023-09-24 MED ORDER — TECHNETIUM TO 99M ALBUMIN AGGREGATED
3.9000 | Freq: Once | INTRAVENOUS | Status: AC | PRN
Start: 2023-09-24 — End: 2023-09-24
  Administered 2023-09-24: 3.9 via INTRAVENOUS

## 2023-09-24 NOTE — Inpatient Diabetes Management (Signed)
 Inpatient Diabetes Program Recommendations  AACE/ADA: New Consensus Statement on Inpatient Glycemic Control  Target Ranges:  Prepandial:   less than 140 mg/dL      Peak postprandial:   less than 180 mg/dL (1-2 hours)      Critically ill patients:  140 - 180 mg/dL    Latest Reference Range & Units 09/23/23 17:08 09/23/23 22:47 09/24/23 07:34  Glucose-Capillary 70 - 99 mg/dL 696 (H) 295 (H) 284 (H)    Latest Reference Range & Units 09/23/23 12:50 09/24/23 07:35  Glucose 70 - 99 mg/dL 132 (H) 440 (H)   Review of Glycemic Control  Diabetes history: DM2 Outpatient Diabetes medications: Basaglar 36 units daily, Jardiance 25 mg QAM, Glipizide 5 mg BID Current orders for Inpatient glycemic control: Novolog 0-15 units TID with meals  Inpatient Diabetes Program Recommendations:    Insulin: Please consider ordering Semglee 14 units Q24H and Novolog 0-5 units at bedtime.  Thanks, Orlando Penner, RN, MSN, CDCES Diabetes Coordinator Inpatient Diabetes Program [phone] (Team Pager from 8am to 5pm)

## 2023-09-24 NOTE — Discharge Summary (Signed)
 Physician Discharge Summary   Patient: Mitchell [name] MRN: [id number] DOB: 18-Mar-1971  Admit date:     09/23/2023  Discharge date: 09/24/23  Discharge Physician: Deanna Artis   PCP: Pearline Cables, MD   Recommendations at discharge:   At this time patient will be discharged home.  If you experience any symptoms such as fever, vomiting, shortness of breath, chest pain, abdominal pain, or other concerning symptoms, please call your primary care provider or go to the emergency department immediately.  Discharge Diagnoses: Principal Problem:   Chest pain Active Problems:   Chronic systolic heart failure (HCC)   CAD (coronary artery disease) with flat troponin    Diarrhea   CKD (chronic kidney disease) stage 3, GFR 30-59 ml/min (HCC)   Essential hypertension  Resolved Problems:   * No resolved hospital problems. *  Hospital Course: Mitchell [name] is a 53 y.o. male with past medical history  of  male with medical history significant of chronic HFrEF with LVEF 20-25%, ischemic and nonischemic cardiomyopathy, HTN,, CAD/CHF/CVA, HSV, pulmonary hypertension, schizophrenia, syncope, CKD stage II, IDDM, diabetic neuropathy, OSA on CPAP, morbid obesity, comes today with complaints of diarrhea chest pain and shortness of breath.  Patient states he was more concerned with his diarrhea as it has been going on for 3 days or so and he cannot make it to the bathroom in time he has also had stool leakage and incontinence.  Patient states his chest pain started today and is left-sided nonradiating he also reports it was tender to touch and that nitroglycerin relieved the chest pain.  Patient does have a history of right BKA and recently underwent treatment question debridement with Dr. Lajoyce Corners for his toe ulcer.  As far as diarrhea is concerned patient did not report any blood or color change in the diarrhea and reports it is brown and watery.  Overall no complaints of shortness of breath prior to this, no  chest pain no palpitations no syncopal episodes falls bleeding or any other complaints otherwise.   Assessment and Plan: Atypical chest pain with history of CAD - Known CAD with three-vessel disease but not amenable to revascularization.  Chest pain resolved with sublingual nitro.  Evaluated by cardiology.  No intervention.  Initiating 30 mg Imdur at night.  Refill of torsemide provided.  Resume previous home cardiac regiment.  Follow-up with cardiology in the outpatient setting.  Prescription for sublingual nitro provided as well.  Chronic HFrEF/mixed ischemic/nonischemic cardiomyopathy with ICD - Does not appear to be in acute exacerbation.  Continue GDMT per cardiology.  Acute diarrheal illness - C. difficile negative.  3 days of diarrhea however appears to be resolved at this time.  Patient no longer complaining of loose stools.  Tolerating p.o., eager for discharge home.  Will give prescription for loperamide to use should diarrhea return.  Diabetes mellitus - Patient resume previous home medication insulin regiment.  Recommend following closely with primary care or endocrinology in the outpatient setting to titrate insulin regimen as needed.      Consultants: Cardiology Procedures performed: None Disposition: Home Diet recommendation:  Discharge Diet Orders (From admission, onward)     Start     Ordered   09/24/23 0000  Diet - low sodium heart healthy        09/24/23 1043           Cardiac and Carb modified diet DISCHARGE MEDICATION: Allergies as of 09/24/2023       Reactions   Marcelline Deist [  dapagliflozin] Other (See Comments)   Patient reports loss of consciousness   Nsaids Anaphylaxis, Rash   Able to tolerate aspirin    Trulicity [dulaglutide] Nausea And Vomiting        Medication List     TAKE these medications    Accu-Chek Guide test strip Generic drug: glucose blood Use to test blood sugars up to 4 times daily as needed.   Accu-Chek Guide w/Device Kit Use  to test blood sugar up to 4 times daily as needed.   Accu-Chek Softclix Lancets lancets Use to test blood sugars up to 4 times daily as needed.   acetaminophen-codeine 300-30 MG tablet Commonly known as: TYLENOL #3 Take 1 tablet by mouth every 6 (six) hours as needed for moderate pain.   amitriptyline 10 MG tablet Commonly known as: ELAVIL Take 1 tablet (10 mg total) by mouth at bedtime.   Aspirin Low Dose 81 MG tablet Generic drug: aspirin EC Take 1 tablet (81 mg total) by mouth daily.   Basaglar KwikPen 100 UNIT/ML Inject 36 Units into the skin daily.   BD Pen Needle Micro U/F 32G X 6 MM Misc Generic drug: Insulin Pen Needle 1 Device by Does not apply route daily in the afternoon.   blood glucose meter kit and supplies Dispense based on patient and insurance preference. Use up to four times daily as directed. (FOR ICD-10 E10.9, E11.9).   carvedilol 6.25 MG tablet Commonly known as: COREG Take 1 tablet (6.25 mg total) by mouth 2 (two) times daily with a meal.   clopidogrel 75 MG tablet Commonly known as: PLAVIX Take 1 tablet by mouth once daily   empagliflozin 25 MG Tabs tablet Commonly known as: Jardiance Take 1 tablet (25 mg total) by mouth daily before breakfast.   Entresto 49-51 MG Generic drug: sacubitril-valsartan Take 1 tablet by mouth 2 (two) times daily.   gabapentin 100 MG capsule Commonly known as: NEURONTIN Take 1 capsule (100 mg total) by mouth 2 (two) times daily. What changed: when to take this   glipiZIDE 5 MG tablet Commonly known as: GLUCOTROL Take 1 tablet (5 mg total) by mouth 2 (two) times daily before a meal.   isosorbide mononitrate 30 MG 24 hr tablet Commonly known as: IMDUR Take 1 tablet (30 mg total) by mouth at bedtime.   loperamide 2 MG capsule Commonly known as: IMODIUM Take 1 capsule (2 mg total) by mouth as needed for diarrhea or loose stools.   methocarbamol 500 MG tablet Commonly known as: ROBAXIN TAKE 1 TABLET BY MOUTH  EVERY 6 HOURS AS NEEDED FOR MUSCLE SPASM   nitroGLYCERIN 0.4 MG SL tablet Commonly known as: NITROSTAT Place 1 tablet (0.4 mg total) under the tongue every 5 (five) minutes as needed for chest pain.   pantoprazole 40 MG tablet Commonly known as: PROTONIX Take 1 tablet (40 mg total) by mouth daily as needed (acid reflux).   potassium chloride SA 20 MEQ tablet Commonly known as: KLOR-CON M TAKE 1 TABLET BY MOUTH EVERY OTHER DAY   rosuvastatin 40 MG tablet Commonly known as: CRESTOR Take 1 tablet (40 mg total) by mouth daily.   spironolactone 25 MG tablet Commonly known as: ALDACTONE Take 0.5 tablets (12.5 mg total) by mouth daily.   tadalafil 20 MG tablet Commonly known as: CIALIS Take 1 tablet (20 mg total) by mouth daily as needed for erectile dysfunction.   tamsulosin 0.4 MG Caps capsule Commonly known as: FLOMAX Take 1 capsule (0.4 mg total) by mouth  daily after supper.   torsemide 20 MG tablet Commonly known as: DEMADEX Take 3 tablets (60 mg total) by mouth daily.   VITAMIN D-3 PO Take 1 capsule by mouth daily.        Discharge Exam: Filed Weights   09/23/23 1225  Weight: 135.2 kg   GENERAL:  Alert, pleasant, obese HEENT:  EOMI CARDIOVASCULAR:  RRR, no murmurs appreciated RESPIRATORY:  Clear to auscultation, no wheezing, rales, or rhonchi GASTROINTESTINAL:  Soft, nontender, nondistended EXTREMITIES: Right BKA NEURO:  No new focal deficits appreciated SKIN:  No rashes noted PSYCH:  Appropriate mood and affect    Condition at discharge: improving  The results of significant diagnostics from this hospitalization (including imaging, microbiology, ancillary and laboratory) are listed below for reference.   Imaging Studies: VAS [place] LOWER EXTREMITY VENOUS (DVT) Result Date: 09/23/2023  Lower Venous DVT Study Patient Name:  JEET SHOUGH  Date of Exam:   09/23/2023 Medical Rec #: [id number]        Accession #:    [id number] Date of Birth: [birth year]          Patient Gender: M Patient Age:   53 years Exam Location[facility]  [facility] Procedure:      VAS [place] LOWER EXTREMITY VENOUS (DVT) Referring Phys: EKTA PATEL --------------------------------------------------------------------------------  Indications: Chest pain.  Risk Factors: Right BKA. Limitations: Right BKA, socket and liner attached over knee. Comparison Study: Prior negative right LEV done 01/23/21. No prior left LEV on                   file Performing Technologist: Sherren Kerns RVS  Examination Guidelines: A complete evaluation includes B-mode imaging, spectral Doppler, color Doppler, and power Doppler as needed of all accessible portions of each vessel. Bilateral testing is considered an integral part of a complete examination. Limited examinations for reoccurring indications may be performed as noted. The reflux portion of the exam is performed with the patient in reverse Trendelenburg.  +---------+---------------+---------+-----------+----------+--------------+ RIGHT    CompressibilityPhasicitySpontaneityPropertiesThrombus Aging +---------+---------------+---------+-----------+----------+--------------+ CFV      Full                                                        +---------+---------------+---------+-----------+----------+--------------+ SFJ      Full                                                        +---------+---------------+---------+-----------+----------+--------------+ FV Prox  Full                                                        +---------+---------------+---------+-----------+----------+--------------+ FV Mid   Full                                                        +---------+---------------+---------+-----------+----------+--------------+ FV DistalFull                                                        +---------+---------------+---------+-----------+----------+--------------+  PFV      Full                                                         +---------+---------------+---------+-----------+----------+--------------+ POP                                                   BKA            +---------+---------------+---------+-----------+----------+--------------+ PTV                                                   BKA            +---------+---------------+---------+-----------+----------+--------------+ PERO                                                  BKA            +---------+---------------+---------+-----------+----------+--------------+   +---------+---------------+---------+-----------+----------+--------------+ LEFT     CompressibilityPhasicitySpontaneityPropertiesThrombus Aging +---------+---------------+---------+-----------+----------+--------------+ CFV      Full           Yes      Yes                                 +---------+---------------+---------+-----------+----------+--------------+ SFJ      Full                                                        +---------+---------------+---------+-----------+----------+--------------+ FV Prox  Full                                                        +---------+---------------+---------+-----------+----------+--------------+ FV Mid   Full                                                        +---------+---------------+---------+-----------+----------+--------------+ FV DistalFull                                                        +---------+---------------+---------+-----------+----------+--------------+ PFV      Full                                                        +---------+---------------+---------+-----------+----------+--------------+  POP      Full           Yes      Yes                                 +---------+---------------+---------+-----------+----------+--------------+ PTV      Full                                                         +---------+---------------+---------+-----------+----------+--------------+ PERO     Full                                                        +---------+---------------+---------+-----------+----------+--------------+    Summary: BILATERAL: - No evidence of deep vein thrombosis seen in the lower extremities, bilaterally. -  LEFT: - No cystic structure found in the popliteal fossa.  *See table(s) above for measurements and observations.    Preliminary    DG Chest 2 View Result Date: 09/23/2023 CLINICAL DATA:  Chest pain and shortness of breath. EXAM: CHEST - 2 VIEW COMPARISON:  08/13/2023 FINDINGS: Stable mild cardiomegaly. Single lead pacemaker remains in appropriate position. Both lungs are clear. IMPRESSION: Mild cardiomegaly. No active lung disease. Electronically Signed   By: Danae Orleans M.D.   On: 09/23/2023 14:02    Microbiology: Results for orders placed or performed during the hospital encounter of 09/23/23  Resp panel by RT-PCR (RSV, Flu A&B, Covid) Anterior Nasal Swab     Status: None   Collection Time: 09/23/23 12:50 PM   Specimen: Anterior Nasal Swab  Result Value Ref Range Status   SARS Coronavirus 2 by RT PCR NEGATIVE NEGATIVE Final   Influenza A by PCR NEGATIVE NEGATIVE Final   Influenza B by PCR NEGATIVE NEGATIVE Final    Comment: (NOTE) The Xpert Xpress SARS-CoV-2/FLU/RSV plus assay is intended as an aid in the diagnosis of influenza from Nasopharyngeal swab specimens and should not be used as a sole basis for treatment. Nasal washings and aspirates are unacceptable for Xpert Xpress SARS-CoV-2/FLU/RSV testing.  Fact Sheet for Patients: BloggerCourse.com  Fact Sheet for Healthcare Providers: SeriousBroker.it  This test is not yet approved or cleared by the [place] FDA and has been authorized for detection and/or diagnosis of SARS-CoV-2 by FDA under an Emergency Use Authorization (EUA). This EUA will  remain in effect (meaning this test can be used) for the duration of the COVID-19 declaration under Section 564(b)(1) of the Act, 21 U.S.C. section 360bbb-3(b)(1), unless the authorization is terminated or revoked.     Resp Syncytial Virus by PCR NEGATIVE NEGATIVE Final    Comment: (NOTE) Fact Sheet for Patients: BloggerCourse.com  Fact Sheet for Healthcare Providers: SeriousBroker.it  This test is not yet approved or cleared by the [place] FDA and has been authorized for detection and/or diagnosis of SARS-CoV-2 by FDA under an Emergency Use Authorization (EUA). This EUA will remain in effect (meaning this test can be used) for the duration of the COVID-19 declaration under Section 564(b)(1) of the Act, 21 U.S.C. section 360bbb-3(b)(1), unless the authorization is terminated or revoked.  Performed at [facility]  [facility], [address], [place], [place] [address]   Gastrointestinal Panel by PCR , Stool     Status: None   Collection Time: 09/23/23  3:30 PM   Specimen: Stool  Result Value Ref Range Status   Campylobacter species NOT DETECTED NOT DETECTED Final   Plesimonas shigelloides NOT DETECTED NOT DETECTED Final   Salmonella species NOT DETECTED NOT DETECTED Final   Yersinia enterocolitica NOT DETECTED NOT DETECTED Final   Vibrio species NOT DETECTED NOT DETECTED Final   Vibrio cholerae NOT DETECTED NOT DETECTED Final   Enteroaggregative E coli (EAEC) NOT DETECTED NOT DETECTED Final   Enteropathogenic E coli (EPEC) NOT DETECTED NOT DETECTED Final   Enterotoxigenic E coli (ETEC) NOT DETECTED NOT DETECTED Final   Shiga like toxin producing E coli (STEC) NOT DETECTED NOT DETECTED Final   Shigella/Enteroinvasive E coli (EIEC) NOT DETECTED NOT DETECTED Final   Cryptosporidium NOT DETECTED NOT DETECTED Final   Cyclospora cayetanensis NOT DETECTED NOT DETECTED Final   Entamoeba histolytica NOT DETECTED NOT DETECTED Final    Giardia lamblia NOT DETECTED NOT DETECTED Final   Adenovirus F40/41 NOT DETECTED NOT DETECTED Final   Astrovirus NOT DETECTED NOT DETECTED Final   Norovirus GI/GII NOT DETECTED NOT DETECTED Final   Rotavirus A NOT DETECTED NOT DETECTED Final   Sapovirus (I, II, IV, and V) NOT DETECTED NOT DETECTED Final    Comment: Performed at [facility], [address] [place], [place] [address]  C Difficile Quick Screen w PCR reflex     Status: None   Collection Time: 09/23/23  3:30 PM   Specimen: Stool  Result Value Ref Range Status   C Diff antigen NEGATIVE NEGATIVE Final   C Diff toxin NEGATIVE NEGATIVE Final   C Diff interpretation No C. difficile detected.  Final    Comment: Performed at [facility], [address], [place], [place] [address]   *Note: Due to a large number of results and/or encounters for the requested time period, some results have not been displayed. A complete set of results can be found in Results Review.    Labs: CBC: Recent Labs  Lab 09/23/23 1250 09/24/23 0735  WBC 9.1 7.6  NEUTROABS 6.9  --   HGB 14.3 13.9  HCT 43.6 42.0  MCV 90.3 90.1  PLT 137* 118*   Basic Metabolic Panel: Recent Labs  Lab 09/23/23 1250 09/24/23 0735  NA 137 137  K 4.2 3.8  CL 103 104  CO2 22 23  GLUCOSE 310* 275*  BUN 42* 40*  CREATININE 1.88* 1.69*  CALCIUM 8.7* 9.0  MG  --  2.2   Liver Function Tests: Recent Labs  Lab 09/23/23 1250  AST 21  ALT 15  ALKPHOS 73  BILITOT 0.7  PROT 7.3  ALBUMIN 3.7   CBG: Recent Labs  Lab 09/23/23 1708 09/23/23 2247 09/24/23 0734  GLUCAP 264* 284* 255*    Discharge time spent: less than 30 minutes.  Signed: Deanna Artis, DO [facility] 09/24/2023

## 2023-09-24 NOTE — Progress Notes (Signed)
 Rounding Note    Patient Name: Mitchell [name] Date of Encounter: 09/24/2023  [facility] Cardiologist: Arvilla Meres, MD   Subjective   No further chest pain  Inpatient Medications    Scheduled Meds:  amitriptyline  10 mg Oral QHS   aspirin EC  81 mg Oral Daily   carvedilol  6.25 mg Oral BID WC   clopidogrel  75 mg Oral Daily   heparin  5,000 Units Subcutaneous Q8H   insulin aspart  0-15 Units Subcutaneous TID WC   isosorbide mononitrate  30 mg Oral Daily   pantoprazole (PROTONIX) IV  40 mg Intravenous Q12H   rosuvastatin  40 mg Oral Daily   tamsulosin  0.4 mg Oral QPC supper   Continuous Infusions:  PRN Meds: acetaminophen, loperamide, nitroGLYCERIN   Vital Signs    Vitals:   09/23/23 2130 09/23/23 2253 09/24/23 0430 09/24/23 0737  BP:  109/65 107/67 107/68  Pulse:  87 82 81  Resp:  18 18 17   Temp: 98.2 F (36.8 C) 98.3 F (36.8 C) 98.2 F (36.8 C)   TempSrc: Oral Oral Oral   SpO2:  98% 99% 96%  Weight:      Height:        Intake/Output Summary (Last 24 hours) at 09/24/2023 0909 Last data filed at 09/23/2023 2242 Gross per 24 hour  Intake --  Output 400 ml  Net -400 ml      09/23/2023   12:25 PM 06/15/2023    3:22 PM 06/10/2023   11:59 PM  Last 3 Weights  Weight (lbs) 298 lb 298 lb 285 lb  Weight (kg) 135.172 kg 135.172 kg 129.275 kg      Telemetry    Sinus rhythm with HR 80s, PVCs, couplets - Personally Reviewed  ECG    No new tracings - Personally Reviewed  Physical Exam   GEN: No acute distress.   Neck: No JVD Cardiac: RRR, no murmurs, rubs, or gallops.  Respiratory: Clear to auscultation bilaterally. GI: Soft, nontender, non-distended  MS: right BKA, no edema on left. Neuro:  Nonfocal  Psych: Normal affect   Labs    High Sensitivity Troponin:   Recent Labs  Lab 09/23/23 1250 09/23/23 1445  TROPONINIHS 16 16     Chemistry Recent Labs  Lab 09/23/23 1250 09/24/23 0735  NA 137 137  K 4.2 3.8  CL 103  104  CO2 22 23  GLUCOSE 310* 275*  BUN 42* 40*  CREATININE 1.88* 1.69*  CALCIUM 8.7* 9.0  MG  --  2.2  PROT 7.3  --   ALBUMIN 3.7  --   AST 21  --   ALT 15  --   ALKPHOS 73  --   BILITOT 0.7  --   GFRNONAA 42* 47*  ANIONGAP 12 10    Lipids No results for input(s): "CHOL", "TRIG", "HDL", "LABVLDL", "LDLCALC", "CHOLHDL" in the last 168 hours.  Hematology Recent Labs  Lab 09/23/23 1250 09/24/23 0735  WBC 9.1 7.6  RBC 4.83 4.66  HGB 14.3 13.9  HCT 43.6 42.0  MCV 90.3 90.1  MCH 29.6 29.8  MCHC 32.8 33.1  RDW 12.3 12.5  PLT 137* 118*   Thyroid No results for input(s): "TSH", "FREET4" in the last 168 hours.  BNP Recent Labs  Lab 09/23/23 1250  BNP 99.1    DDimer No results for input(s): "DDIMER" in the last 168 hours.   Radiology    VAS [place] LOWER EXTREMITY VENOUS (DVT) Result Date:  09/23/2023  Lower Venous DVT Study Patient Name:  Mitchell [name]  Date of Exam:   09/23/2023 Medical Rec #: [id number]        Accession #:    [id number] Date of Birth: 06/26/1971         Patient Gender: M Patient Age:   53 years Exam Location:  [facility] Procedure:      VAS [place] LOWER EXTREMITY VENOUS (DVT) Referring Phys: EKTA PATEL --------------------------------------------------------------------------------  Indications: Chest pain.  Risk Factors: Right BKA. Limitations: Right BKA, socket and liner attached over knee. Comparison Study: Prior negative right LEV done 01/23/21. No prior left LEV on                   file Performing Technologist: Sherren Kerns RVS  Examination Guidelines: A complete evaluation includes B-mode imaging, spectral Doppler, color Doppler, and power Doppler as needed of all accessible portions of each vessel. Bilateral testing is considered an integral part of a complete examination. Limited examinations for reoccurring indications may be performed as noted. The reflux portion of the exam is performed with the patient in reverse Trendelenburg.   +---------+---------------+---------+-----------+----------+--------------+ RIGHT    CompressibilityPhasicitySpontaneityPropertiesThrombus Aging +---------+---------------+---------+-----------+----------+--------------+ CFV      Full                                                        +---------+---------------+---------+-----------+----------+--------------+ SFJ      Full                                                        +---------+---------------+---------+-----------+----------+--------------+ FV Prox  Full                                                        +---------+---------------+---------+-----------+----------+--------------+ FV Mid   Full                                                        +---------+---------------+---------+-----------+----------+--------------+ FV DistalFull                                                        +---------+---------------+---------+-----------+----------+--------------+ PFV      Full                                                        +---------+---------------+---------+-----------+----------+--------------+ POP  BKA            +---------+---------------+---------+-----------+----------+--------------+ PTV                                                   BKA            +---------+---------------+---------+-----------+----------+--------------+ PERO                                                  BKA            +---------+---------------+---------+-----------+----------+--------------+   +---------+---------------+---------+-----------+----------+--------------+ LEFT     CompressibilityPhasicitySpontaneityPropertiesThrombus Aging +---------+---------------+---------+-----------+----------+--------------+ CFV      Full           Yes      Yes                                  +---------+---------------+---------+-----------+----------+--------------+ SFJ      Full                                                        +---------+---------------+---------+-----------+----------+--------------+ FV Prox  Full                                                        +---------+---------------+---------+-----------+----------+--------------+ FV Mid   Full                                                        +---------+---------------+---------+-----------+----------+--------------+ FV DistalFull                                                        +---------+---------------+---------+-----------+----------+--------------+ PFV      Full                                                        +---------+---------------+---------+-----------+----------+--------------+ POP      Full           Yes      Yes                                 +---------+---------------+---------+-----------+----------+--------------+ PTV      Full                                                        +---------+---------------+---------+-----------+----------+--------------+  PERO     Full                                                        +---------+---------------+---------+-----------+----------+--------------+    Summary: BILATERAL: - No evidence of deep vein thrombosis seen in the lower extremities, bilaterally. -  LEFT: - No cystic structure found in the popliteal fossa.  *See table(s) above for measurements and observations.    Preliminary    DG Chest 2 View Result Date: 09/23/2023 CLINICAL DATA:  Chest pain and shortness of breath. EXAM: CHEST - 2 VIEW COMPARISON:  08/13/2023 FINDINGS: Stable mild cardiomegaly. Single lead pacemaker remains in appropriate position. Both lungs are clear. IMPRESSION: Mild cardiomegaly. No active lung disease. Electronically Signed   By: Danae Orleans M.D.   On: 09/23/2023 14:02    Cardiac Studies   Echo  05/2023: 1. Left ventricular ejection fraction, by estimation, is 30 to 35%. The  left ventricle has moderately decreased function. The left ventricle  demonstrates regional wall motion abnormalities (see scoring  diagram/findings for description). The left  ventricular internal cavity size was moderately dilated. Left ventricular  diastolic function could not be evaluated.   2. Right ventricular systolic function was not well visualized. The right  ventricular size is not well visualized.   3. The mitral valve is normal in structure. No evidence of mitral valve  regurgitation. No evidence of mitral stenosis.   4. The aortic valve is normal in structure. Aortic valve regurgitation is  not visualized. No aortic stenosis is present.   5. The inferior vena cava is normal in size with greater than 50%  respiratory variability, suggesting right atrial pressure of 3 mmHg.   Patient Profile     53 y.o. male with a hx of HFrEF, mixed ischemic/nonischemic cardiomyopathy s/p ICD 12/2021, severe 3-vessel CAD, obesity, hypertension, diabetes, OSA, bipolar disease/schizophrenia, history of prior CVA  who is being seen for the evaluation of chest pain   Assessment & Plan    Chest pain CAD - known 3v disease that is not amenable to revascularization - troponin x 2 negative - CP resolved with SL NTG x 1 in the setting of diarrheal illness - decision made to titrate anti-anginals - remains on DAPT - started on 30 mg imdur - continue 6.25 mg coreg BID   Chronic systolic heart failure Mixed ishemic/nonischemic cardiomyopathy ICD in place - GDMT with coreg, 49-51 mg entresto BID, 60 mg torsemide daily, 12.5 mg spironolactone and 25 mg jardiance - added imdur as above - no headache - he did not tolerate jardiance secondary to diarrhea - he is compliant with other medications - he is out of torsemide - I checked with pharmacy and he should have refills available   PVCs / couplets - continue BB,  asymptomatic - ICD in place   [facility] will sign off.   Medication Recommendations:  start 30 mg imdur at night, refill 60 mg torsemide, given NTG SL to use PRN Other recommendations (labs, testing, etc):  none Follow up as an outpatient:  I will arrange      For questions or updates, please contact [facility] Please consult [email or web address] for contact info under        Signed, Marcelino Duster, PA  09/24/2023, 9:09 AM

## 2023-10-15 ENCOUNTER — Ambulatory Visit: Payer: No Typology Code available for payment source

## 2023-10-15 DIAGNOSIS — I5022 Chronic systolic (congestive) heart failure: Secondary | ICD-10-CM

## 2023-10-15 NOTE — Progress Notes (Deleted)
  Healthcare at [facility] [address], [address] [place], [place] [address] [phone]  Date:  10/17/2023   Name:  Mitchell [name]   DOB:  [birth year]   MRN:  [id number]  PCP:  Pearline Cables, MD    Chief Complaint: No chief complaint on file.   History of Present Illness:  Mitchell [name] is a 53 y.o. very pleasant male patient who presents with the following:  Pt seen today for hospital follow-up Last seen by myself in October   Patient Active Problem List   Diagnosis Date Noted   Chest pain 09/23/2023   Diarrhea 09/23/2023   History of right below knee amputation (HCC) 03/16/2023   Diabetes mellitus (HCC) 03/16/2023   BPH with obstruction/lower urinary tract symptoms 11/21/2022   UTI (urinary tract infection) 10/23/2022   ICD (implantable cardioverter-defibrillator) in place 04/19/2022   Near syncope 12/08/2021   CKD (chronic kidney disease) stage 3, GFR 30-59 ml/min (HCC) 12/08/2021   PVD s/p R LE amputation  12/08/2021   Diabetic retinopathy associated with type 2 diabetes mellitus (HCC) 06/06/2021   CAD (coronary artery disease) with flat troponin  04/11/2021   S/P below knee amputation, right (HCC) 02/07/2021   AKI (acute kidney injury) (HCC) 01/23/2021   QT prolongation 07/30/2019   Acute on chronic combined systolic and diastolic CHF (congestive heart failure) (HCC) 07/29/2019   Stroke (HCC) 05/08/2017   Uncontrolled type 2 diabetes mellitus with hyperglycemia, with long-term current use of insulin (HCC) 12/07/2015   Chronic systolic heart failure (HCC) 10/02/2011   Organic impotence 10/02/2011   Obstructive sleep apnea 10/11/2007   HLD (hyperlipidemia) 10/10/2007   HYPOKALEMIA 10/10/2007   Obesity, Class III, BMI 40-49.9 (morbid obesity) (HCC) 10/10/2007   Essential hypertension 10/10/2007   PREMATURE VENTRICULAR CONTRACTIONS 10/10/2007   History of colonic polyps 10/10/2007    Past Medical History:  Diagnosis  Date   Bipolar disorder (HCC)    CAD (coronary artery disease)    a. diffuse 3v CAD by cath 2019, medical therapy recommended.   Cataract    forming    Chronic systolic CHF (congestive heart failure) (HCC)    CVA (cerebral infarction)    No residual deficits   Depression    PTSD,    Diabetes mellitus    TYPE II; UNCONTROLLED BY HEMOGLOBIN A1c; STABLE AS  PER DISCHARGE   Headache(784.0)    Herpes simplex of male genitalia    History of colonic polyps    Hyperlipidemia    Hypertension    Myocardial infarction (HCC) 1987   (while playing football)   Obesity    OSA (obstructive sleep apnea)    repeat study 2018 without significant OSA   Pneumonia    Post-cardiac injury syndrome (HCC)    History of cardiac injury from blunt trauma   Pulmonary hypertension (HCC)    a. moderately elevated PASP 07/2019.   PVCs (premature ventricular contractions)    Schizophrenia (HCC)    Goes to [facility]   Sleep apnea    Stroke [facility]) 2005   some left side weakness   Syncope    Recurrent, thought to be vasovagal. Also has h/o frequent PVCs.     Past Surgical History:  Procedure Laterality Date   AMPUTATION Right 01/25/2021   Procedure: RIGHT BELOW KNEE AMPUTATION;  Surgeon: Nadara Mustard, MD;  Location: [facility] OR;  Service: Orthopedics;  Laterality: Right;   CARDIAC CATHETERIZATION  12/19/2010  DIFFUSE NONOBSTRUCTIVE CAD; NONISCHEMIC CARDIOMYOPATHY; LEFT VENTRICULAR ANGIOGRAM WAS PERFORMED SECONDARY TO  ELEVATED LEFT VENTRICULAR FILLING PRESSURES   COLONOSCOPY     ~ age [age]   COLONOSCOPY W/ POLYPECTOMY     I & D EXTREMITY Bilateral 10/24/2019   Procedure: IRRIGATION AND DEBRIDEMENT BILATERAL EXTREMITY WOUND ON FOOT;  Surgeon: Allena Napoleon, MD;  Location: [facility] OR;  Service: Plastics;  Laterality: Bilateral;   ICD IMPLANT N/A 01/13/2022   Procedure: ICD IMPLANT;  Surgeon: Marinus Maw, MD;  Location: [facility] INVASIVE CV LAB;  Service: Cardiovascular;  Laterality: N/A;    METATARSAL HEAD EXCISION Right 08/29/2018   Procedure: METATARSAL HEAD RESECTION;  Surgeon: Felecia Shelling, DPM;  Location: [facility] OR;  Service: Podiatry;  Laterality: Right;   METATARSAL OSTEOTOMY Right 08/29/2018   Procedure: SUB FIFTHE METATARSIA RIGHT FOOT;  Surgeon: Felecia Shelling, DPM;  Location: [facility] OR;  Service: Podiatry;  Laterality: Right;   MULTIPLE EXTRACTIONS WITH ALVEOLOPLASTY  01/27/2014   "all my teeth; 4 Quadrants of alveoloplasty   MULTIPLE EXTRACTIONS WITH ALVEOLOPLASTY N/A 01/27/2014   Procedure: EXTRACTION OF TEETH #'1, 2, 3, 4, 5, 6, 7, 8, 9, 10, 11, 12, 13, 14, 15, 16, 17, 20, 21, 22, 23, 24, 25, 26, 27, 28, 29, 31 and 32 WITH ALVEOLOPLASTY;  Surgeon: Charlynne Pander, DDS;  Location: [facility] OR;  Service: Oral Surgery;  Laterality: N/A;   ORIF FINGER / THUMB FRACTURE Right    POLYPECTOMY     RIGHT HEART CATH N/A 01/27/2021   Procedure: RIGHT HEART CATH;  Surgeon: Dolores Patty, MD;  Location: [facility] INVASIVE CV LAB;  Service: Cardiovascular;  Laterality: N/A;   RIGHT/LEFT HEART CATH AND CORONARY ANGIOGRAPHY N/A 09/26/2017   Procedure: RIGHT/LEFT HEART CATH AND CORONARY ANGIOGRAPHY;  Surgeon: Dolores Patty, MD;  Location: [facility] INVASIVE CV LAB;  Service: Cardiovascular;  Laterality: N/A;   RIGHT/LEFT HEART CATH AND CORONARY ANGIOGRAPHY N/A 04/12/2021   Procedure: RIGHT/LEFT HEART CATH AND CORONARY ANGIOGRAPHY;  Surgeon: Dolores Patty, MD;  Location: [facility] INVASIVE CV LAB;  Service: Cardiovascular;  Laterality: N/A;   SKIN SPLIT GRAFT Bilateral 10/24/2019   Procedure: SKIN GRAFT SPLIT THICKNESS LEFT THIGH;  Surgeon: Allena Napoleon, MD;  Location: [facility] OR;  Service: Plastics;  Laterality: Bilateral;   WOUND DEBRIDEMENT Right 08/29/2018   Procedure: Debridement of ulcer on right fifth metatarsal;  Surgeon: Felecia Shelling, DPM;  Location: [facility] OR;  Service: Podiatry;  Laterality: Right;    Social History   Tobacco Use   Smoking status: Never   Smokeless tobacco: Never   Vaping Use   Vaping status: Never Used  Substance Use Topics   Alcohol use: No   Drug use: No    Family History  Problem Relation Age of Onset   Heart disease Mother        MI   Heart failure Mother    Diabetes Mother        ALSO IN MOST OF HIS SIBLINGS; 2 UNLCES HAVE ALSO PASSED AWAY FROM DM   Cardiomyopathy Mother    Cancer - Ovarian Mother    Ovarian cancer Mother    Heart disease Father    Hypertension Father    Diabetes Father    Diabetes Sister    Diabetes Brother    Colon cancer Paternal Uncle    Colon cancer Paternal Uncle    Colon polyps Neg Hx    Esophageal cancer Neg Hx    Rectal cancer Neg Hx  Stomach cancer Neg Hx     Allergies  Allergen Reactions   Farxiga [Dapagliflozin] Other (See Comments)    Patient reports loss of consciousness   Nsaids Anaphylaxis and Rash    Able to tolerate aspirin    Trulicity [Dulaglutide] Nausea And Vomiting    Medication list has been reviewed and updated.  Current Outpatient Medications on File Prior to Visit  Medication Sig Dispense Refill   Accu-Chek Softclix Lancets lancets Use to test blood sugars up to 4 times daily as needed. 100 each 0   acetaminophen-codeine (TYLENOL #3) 300-30 MG tablet Take 1 tablet by mouth every 6 (six) hours as needed for moderate pain. (Patient not taking: Reported on 09/23/2023) 30 tablet 0   amitriptyline (ELAVIL) 10 MG tablet Take 1 tablet (10 mg total) by mouth at bedtime. 30 tablet 0   aspirin 81 MG EC tablet Take 1 tablet (81 mg total) by mouth daily. 30 tablet 12   blood glucose meter kit and supplies Dispense based on patient and insurance preference. Use up to four times daily as directed. (FOR ICD-10 E10.9, E11.9). 1 each 0   Blood Glucose Monitoring Suppl (BLOOD GLUCOSE MONITOR SYSTEM) w/Device KIT Use to test blood sugar up to 4 times daily as needed. 1 kit 0   carvedilol (COREG) 6.25 MG tablet Take 1 tablet (6.25 mg total) by mouth 2 (two) times daily with a meal. 180 tablet 3    Cholecalciferol (VITAMIN D-3 PO) Take 1 capsule by mouth daily.     clopidogrel (PLAVIX) 75 MG tablet Take 1 tablet by mouth once daily 30 tablet 11   empagliflozin (JARDIANCE) 25 MG TABS tablet Take 1 tablet (25 mg total) by mouth daily before breakfast. (Patient not taking: Reported on 06/15/2023) 90 tablet 3   gabapentin (NEURONTIN) 100 MG capsule Take 1 capsule (100 mg total) by mouth 2 (two) times daily. (Patient taking differently: Take 100 mg by mouth every evening.) 60 capsule 3   glipiZIDE (GLUCOTROL) 5 MG tablet Take 1 tablet (5 mg total) by mouth 2 (two) times daily before a meal. 180 tablet 3   glucose blood (ACCU-CHEK GUIDE) test strip Use to test blood sugars up to 4 times daily as needed. 100 each 0   Insulin Glargine (BASAGLAR KWIKPEN) 100 UNIT/ML Inject 36 Units into the skin daily. 45 mL 3   Insulin Pen Needle (BD PEN NEEDLE MICRO U/F) 32G X 6 MM MISC 1 Device by Does not apply route daily in the afternoon. 100 each 3   isosorbide mononitrate (IMDUR) 30 MG 24 hr tablet Take 1 tablet (30 mg total) by mouth at bedtime. 30 tablet 1   loperamide (IMODIUM) 2 MG capsule Take 1 capsule (2 mg total) by mouth as needed for diarrhea or loose stools. 30 capsule 0   methocarbamol (ROBAXIN) 500 MG tablet TAKE 1 TABLET BY MOUTH EVERY 6 HOURS AS NEEDED FOR MUSCLE SPASM 30 tablet 1   nitroGLYCERIN (NITROSTAT) 0.4 MG SL tablet Place 1 tablet (0.4 mg total) under the tongue every 5 (five) minutes as needed for chest pain. 30 tablet 0   pantoprazole (PROTONIX) 40 MG tablet Take 1 tablet (40 mg total) by mouth daily as needed (acid reflux). 30 tablet 6   potassium chloride SA (KLOR-CON M) 20 MEQ tablet TAKE 1 TABLET BY MOUTH EVERY OTHER DAY 45 tablet 7   rosuvastatin (CRESTOR) 40 MG tablet Take 1 tablet (40 mg total) by mouth daily. 90 tablet 3   sacubitril-valsartan (ENTRESTO) 49-51  MG Take 1 tablet by mouth 2 (two) times daily. 180 tablet 3   spironolactone (ALDACTONE) 25 MG tablet Take 0.5  tablets (12.5 mg total) by mouth daily. 90 tablet 3   tadalafil (CIALIS) 20 MG tablet Take 1 tablet (20 mg total) by mouth daily as needed for erectile dysfunction. 10 tablet 11   tamsulosin (FLOMAX) 0.4 MG CAPS capsule Take 1 capsule (0.4 mg total) by mouth daily after supper. 30 capsule 11   torsemide (DEMADEX) 20 MG tablet Take 3 tablets (60 mg total) by mouth daily. 90 tablet 3   No current facility-administered medications on file prior to visit.    Review of Systems:  ***  Physical Examination: There were no vitals filed for this visit. There were no vitals filed for this visit. There is no height or weight on file to calculate BMI. Ideal Body Weight:    ***  Assessment and Plan: ***  Signed Abbe Amsterdam, MD

## 2023-10-17 ENCOUNTER — Inpatient Hospital Stay: Payer: Self-pay | Admitting: Family Medicine

## 2023-10-17 LAB — CUP PACEART REMOTE DEVICE CHECK
Battery Remaining Longevity: 120 mo
Battery Voltage: 3.01 V
Brady Statistic RV Percent Paced: 0 %
Date Time Interrogation Session: 20250317043823
HighPow Impedance: 58 Ohm
Implantable Lead Connection Status: 753985
Implantable Lead Implant Date: 20230616
Implantable Lead Location: 753860
Implantable Lead Model: 6935
Implantable Pulse Generator Implant Date: 20230616
Lead Channel Impedance Value: 361 Ohm
Lead Channel Impedance Value: 494 Ohm
Lead Channel Pacing Threshold Amplitude: 0.5 V
Lead Channel Pacing Threshold Pulse Width: 0.4 ms
Lead Channel Sensing Intrinsic Amplitude: 21.75 mV
Lead Channel Sensing Intrinsic Amplitude: 21.75 mV
Lead Channel Setting Pacing Amplitude: 2 V
Lead Channel Setting Pacing Pulse Width: 0.4 ms
Lead Channel Setting Sensing Sensitivity: 0.3 mV
Zone Setting Status: 755011

## 2023-10-18 ENCOUNTER — Telehealth (HOSPITAL_COMMUNITY): Payer: Self-pay

## 2023-10-18 NOTE — Telephone Encounter (Signed)
 Called to confirm/remind patient of their appointment at [facility] on 10/19/23.   Appointment:   [x] Confirmed  [] Left mess   [] No answer/No voice mail  [] Phone not in service  Patient reminded to bring all medications and/or complete list.  Confirmed patient has transportation. Gave directions, instructed to utilize valet parking.

## 2023-10-19 ENCOUNTER — Encounter (HOSPITAL_COMMUNITY): Payer: Self-pay

## 2023-11-02 NOTE — Progress Notes (Signed)
 [facility] Note  PCP: [name], Gwenlyn Found, MD HF Cardiologist: Dr. Gala Romney  HPI: Mitchell [name] is a 53 y.o. male with multiple medical problems HFrEF, mixed ischemic/nonischemic CM, 3v CAD, obesity, hypertension, type II DM, obstructive sleep apnea, bipolar disease/schizophrenia, prior CVA.    Had [facility] on 09/26/17 with 3v disease and preserved CO/CI. No revascularization options. Saw EP about ICD but did not f/u.   Patient admitted June 2022 with sepsis secondary to RLE abscess/cellulitis and COVID-19. He ended up requiring R BKA.  Readmitted 04/11/21 with a/c HFrEF and NSTEMI.  He was diuresed with IV lasix and started on GDMT. L/RHC with severe 3 vessel CAD (no targets for intervention), RA 4, PA 49/17 (31), PCW 15, Fick CO/index 6.7/2.7. He was referred to paramedicine program at discharge. Visual impairment felt to be contributor to issues with medication compliance.  Echo 2/23, EF 30-35%, RV ok. IVC not dilated. Seen at Urgent Care 2/23 and treated for simple UTI, UA+, CT w/ no evidence of pyelo or kidney stone.  Follow up 5/23, doing well, volume ok. Referred for ICD.   Seen in ED 12/08/21 for near syncope, ? hypoglycemic episode vs arrhythmia. Frequent PVCs on tele and Zio AT 14 day placed to quantify. Patient requested to schedule ICD implant.  S/p MDT ICD 6/23.  Seen in ED 03/05/22 for SOB, given IV lasix and then left. Post ED follow up 8/23, he remained volume up and several med discrepancies. Started torsemide 60 mg daily but later decreased to 20 mg daily with increase in SCr. Re-enrolled in paramedicine.  Admitted 3/24 with complicated UTI and AKI. GDMT held. Received ceftriaxone, BCx NGTD. Appeared to have undiagnosed BPH and started on tamsulosin. GDMT slowly added back as BP and SCr allowed. Discharged home, weight 281 lbs.  Admitted 2/25 with chest pain. CP resolved with SL nitroglycerin. Started on Imdur.   Today he returns for post hospital  follow up. Overall feeling ok. Denies palpitations, CP, dizziness, edema, or PND/Orthopnea. Denies SOB. Appetite good, occasionally reaches for the salt shaker. No fever or chills. Does not weight at home. Able to get around the house with his prosthesis. Taking all medications, occasionally misses some. Denies ETOH, tobacco or drug use.   Cardiac Testing: - Echo 05/29/23 EF 30-35%, RV not well seen - Echo (2/23): EF 30-35%, RV ok  - R/LHC (9/22):   Prox LAD to Mid LAD lesion is 80% stenosed.   Dist LAD lesion is 99% stenosed.   Dist RCA lesion is 90% stenosed.   Ost 1st Mrg lesion is 95% stenosed.   1st Mrg lesion is 99% stenosed.   Ost 2nd Mrg to 2nd Mrg lesion is 99% stenosed.   Ost RPDA to RPDA lesion is 90% stenosed.   RPAV lesion is 95% stenosed.   Mid LM lesion is 20% stenosed.  Ao = 131/84 (107), LV = 131/22, RA = 4, RV = 49/11, PA = 49/17 (31), PCW = 15, Fick CO/CI= 6.7/2.7, PVR = 2.4, FA sat = 95%, PA sat = 56%, 63% Assessment: Severe 3v diffuse CAD with moderate progression from previous Ischemic CM EF 25% Mild pulmonary venous HTN with preserved cardiac output   - Echo (6/22): EF 20-25%, mild LVH, trivial MR  ROS: All systems negative except as listed in HPI, PMH and Problem List.  SH:  Social History   Socioeconomic History   Marital status: Married    Spouse name: Not on file   Number of children:  5   Years of education: Not on file   Highest education level: Not on file  Occupational History   Occupation: Mortician  Tobacco Use   Smoking status: Never   Smokeless tobacco: Never  Vaping Use   Vaping status: Never Used  Substance and Sexual Activity   Alcohol use: No   Drug use: No   Sexual activity: Not on file  Other Topics Concern   Not on file  Social History Narrative   MARRIED, LIVES IN [place] WITH WIFE; 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   Social Drivers of Health    Financial Resource Strain: Medium Risk (04/13/2021)   Overall Financial Resource Strain (CARDIA)    Difficulty of Paying Living Expenses: Somewhat hard  Food Insecurity: Low Risk  (08/13/2023)   Received from [facility]   Hunger Vital Sign    Worried About Running Out of Food in the Last Year: Never true    Ran Out of Food in the Last Year: Never true  Transportation Needs: Unmet Transportation Needs (08/13/2023)   Received from [facility]    In the past 12 months, has lack of reliable transportation kept you from medical appointments, meetings, work or from getting things needed for daily living? : Yes  Physical Activity: Not on file  Stress: Not on file  Social Connections: Not on file  Intimate Partner Violence: Not on file   FH:  Family History  Problem Relation Age of Onset   Heart disease Mother        MI   Heart failure Mother    Diabetes Mother        ALSO IN MOST OF HIS SIBLINGS; 2 UNLCES HAVE ALSO PASSED AWAY FROM DM   Cardiomyopathy Mother    Cancer - Ovarian Mother    Ovarian cancer Mother    Heart disease Father    Hypertension Father    Diabetes Father    Diabetes Sister    Diabetes Brother    Colon cancer Paternal Uncle    Colon cancer Paternal Uncle    Colon polyps Neg Hx    Esophageal cancer Neg Hx    Rectal cancer Neg Hx    Stomach cancer Neg Hx    Past Medical History:  Diagnosis Date   Bipolar disorder (HCC)    CAD (coronary artery disease)    a. diffuse 3v CAD by cath 2019, medical therapy recommended.   Cataract    forming    Chronic systolic CHF (congestive heart failure) (HCC)    CVA (cerebral infarction)    No residual deficits   Depression    PTSD,    Diabetes mellitus    TYPE II; UNCONTROLLED BY HEMOGLOBIN A1c; STABLE AS  PER DISCHARGE   Headache(784.0)    Herpes simplex of male genitalia    History of colonic polyps    Hyperlipidemia    Hypertension    Myocardial infarction (HCC) 1987   (while playing  football)   Obesity    OSA (obstructive sleep apnea)    repeat study 2018 without significant OSA   Pneumonia    Post-cardiac injury syndrome (HCC)    History of cardiac injury from blunt trauma   Pulmonary hypertension (HCC)    a. moderately elevated PASP 07/2019.   PVCs (premature ventricular contractions)    Schizophrenia (HCC)    Go[facility] to [facility]   Sleep apnea    Stroke [facility]) 2005   some left side weakness   Syncope    Recurrent, thought to be vasovagal. Also has h/o frequent PVCs.    Current Outpatient Medications  Medication Sig Dispense Refill   Accu-Chek Softclix Lancets lancets Use to test blood sugars up to 4 times daily as needed. 100 each 0   acetaminophen-codeine (TYLENOL #3) 300-30 MG tablet Take 1 tablet by mouth every 6 (six) hours as needed for moderate pain. 30 tablet 0   amitriptyline (ELAVIL) 10 MG tablet Take 1 tablet (10 mg total) by mouth at bedtime. 30 tablet 0   aspirin 81 MG EC tablet Take 1 tablet (81 mg total) by mouth daily. 30 tablet 12   blood glucose meter kit and supplies Dispense based on patient and insurance preference. Use up to four times daily as directed. (FOR ICD-10 E10.9, E11.9). 1 each 0   Blood Glucose Monitoring Suppl (BLOOD GLUCOSE MONITOR SYSTEM) w/Device KIT Use to test blood sugar up to 4 times daily as needed. 1 kit 0   carvedilol (COREG) 6.25 MG tablet Take 1 tablet (6.25 mg total) by mouth 2 (two) times daily with a meal. 180 tablet 3   Cholecalciferol (VITAMIN D-3 PO) Take 1 capsule by mouth daily.     clopidogrel (PLAVIX) 75 MG tablet Take 1 tablet by mouth once daily 30 tablet 11   empagliflozin (JARDIANCE) 25 MG TABS tablet Take 1 tablet (25 mg total) by mouth daily before breakfast. 90 tablet 3   gabapentin (NEURONTIN) 100 MG capsule Take 1 capsule (100 mg total) by mouth 2 (two) times daily. (Patient taking differently: Take 100 mg by mouth every evening.) 60 capsule 3   glipiZIDE (GLUCOTROL) 5 MG tablet Take  1 tablet (5 mg total) by mouth 2 (two) times daily before a meal. 180 tablet 3   glucose blood (ACCU-CHEK GUIDE) test strip Use to test blood sugars up to 4 times daily as needed. 100 each 0   Insulin Glargine (BASAGLAR KWIKPEN) 100 UNIT/ML Inject 36 Units into the skin daily. 45 mL 3   Insulin Pen Needle (BD PEN NEEDLE MICRO U/F) 32G X 6 MM MISC 1 Device by Does not apply route daily in the afternoon. 100 each 3   isosorbide mononitrate (IMDUR) 30 MG 24 hr tablet Take 1 tablet (30 mg total) by mouth at bedtime. 30 tablet 1   loperamide (IMODIUM) 2 MG capsule Take 1 capsule (2 mg total) by mouth as needed for diarrhea or loose stools. 30 capsule 0   methocarbamol (ROBAXIN) 500 MG tablet TAKE 1 TABLET BY MOUTH EVERY 6 HOURS AS NEEDED FOR MUSCLE SPASM 30 tablet 1   nitroGLYCERIN (NITROSTAT) 0.4 MG SL tablet Place 1 tablet (0.4 mg total) under the tongue every 5 (five) minutes as needed for chest pain. 30 tablet 0   pantoprazole (PROTONIX) 40 MG tablet Take 1 tablet (40 mg total) by mouth daily as needed (acid reflux). 30 tablet 6   potassium chloride SA (KLOR-CON M) 20 MEQ tablet TAKE 1 TABLET BY MOUTH EVERY OTHER DAY 45 tablet 7   rosuvastatin (CRESTOR) 40 MG tablet Take 1 tablet (40 mg total) by mouth daily. 90 tablet 3   sacubitril-valsartan (ENTRESTO) 49-51 MG Take 1 tablet by mouth 2 (two) times daily. 180 tablet 3   spironolactone (ALDACTONE) 25 MG tablet Take 0.5 tablets (12.5 mg total) by mouth daily. 90 tablet 3   tadalafil (CIALIS) 20 MG tablet Take  1 tablet (20 mg total) by mouth daily as needed for erectile dysfunction. 10 tablet 11   tamsulosin (FLOMAX) 0.4 MG CAPS capsule Take 1 capsule (0.4 mg total) by mouth daily after supper. 30 capsule 11   torsemide (DEMADEX) 20 MG tablet Take 3 tablets (60 mg total) by mouth daily. 90 tablet 3   No current facility-administered medications for this encounter.   Wt Readings from Last 3 Encounters:  11/08/23 131.6 kg (290 lb 3.2 oz)  09/23/23  135.2 kg (298 lb)  06/15/23 135.2 kg (298 lb)   BP 104/88   Pulse 92   Wt 131.6 kg (290 lb 3.2 oz)   SpO2 98%   BMI 39.91 kg/m   PHYSICAL EXAM: General:  well appearing.  No respiratory difficulty. Arrived in [facility] Neck: supple. JVD flat.  Cor: PMI nondisplaced. Regular rate & rhythm. No rubs, gallops or murmurs. Lungs: diminished Extremities: no cyanosis, clubbing, rash, edema. Prosthesis to RLE Neuro: alert & oriented x 3. Moves all 4 extremities w/o difficulty. Affect pleasant.   Device interrogation (personally reviewed): VP <0.1%, Optivol ok. No VT/AF. Activity level 1.3 hr/day  EKG: NSR 90s (Personally reviewed)    ASSESSMENT & PLAN:  Chronic Systolic HF /Mixed ischemic/nonischemic cardiomyopathy  - Echo (6/22): EF 20 to 25%, stable from prior exams dating back to 30  - R/LHC (9/22): Severe 3v CAD (no targets for revascularization), mild pulm venous HTN with preserved CO. - Echo (2/23): EF 30-35%, RV normal. IVC not dilated  - Echo 10/24: EF 30-35%, LV with RWMA, RV not well seen, no MR.  - He has LBBB but QRS <150 ms. Not candidate for CRT-D.  - s/p MDT ICD - NYHA II stable. Volume status ok today. - Continue torsemide 60 mg daily + 20 KCL QOD. - Continue Coreg 6.25 mg bid. - Continue Entresto 49/51 mg bid. - Continue spiro 12.5 mg daily. - Continue Jardiance 25 mg daily. No GU symptoms. - Labs today   2. H/o AKI - Baseline Scr ~1.3-1.5.  - BMET today.   3. CAD/NSTEMI 09/22 - Last Cath with progressive 3v CAD as above. No targets for intervention.  - No ischemic CP - Continue DAPT with aspirin + plavix, statin  - Continue beta blocker.   4. Hypertension - Stable.  - GDMT as above.   5. HLD  - Continue rosuvastatin. - Lipids followed by PCP, last LDL 100 11/23   6. DM2 - Last A1c 8.4  - He is on insulin, management per PCP   7. PVD  - s/p R BKA  - Diabetic foot wound complicated by sepsis that led to amputation on 01/25/2021 - Follows closely with  Dr. Lajoyce Corners - Continue aspirin and statin.   8. SDOH - Doing better with medications. - No longer followed by paramedicine.  Follow up in 6 months with Dr. Gala Romney.   Alen Bleacher, AGACNP-BC  11/08/23

## 2023-11-08 ENCOUNTER — Ambulatory Visit (HOSPITAL_COMMUNITY)
Admission: RE | Admit: 2023-11-08 | Discharge: 2023-11-08 | Disposition: A | Discharge status: Home / Self Care | Payer: Self-pay | Source: Ambulatory Visit | Attending: Internal Medicine | Admitting: Internal Medicine

## 2023-11-08 ENCOUNTER — Encounter (HOSPITAL_COMMUNITY): Payer: Self-pay

## 2023-11-08 VITALS — BP 104/88 | HR 92 | Wt 290.2 lb

## 2023-11-08 DIAGNOSIS — Z8616 Personal history of COVID-19: Secondary | ICD-10-CM | POA: Insufficient documentation

## 2023-11-08 DIAGNOSIS — E1151 Type 2 diabetes mellitus with diabetic peripheral angiopathy without gangrene: Secondary | ICD-10-CM | POA: Insufficient documentation

## 2023-11-08 DIAGNOSIS — E669 Obesity, unspecified: Secondary | ICD-10-CM | POA: Insufficient documentation

## 2023-11-08 DIAGNOSIS — I10 Essential (primary) hypertension: Secondary | ICD-10-CM

## 2023-11-08 DIAGNOSIS — Z89511 Acquired absence of right leg below knee: Secondary | ICD-10-CM | POA: Insufficient documentation

## 2023-11-08 DIAGNOSIS — Z8673 Personal history of transient ischemic attack (TIA), and cerebral infarction without residual deficits: Secondary | ICD-10-CM | POA: Insufficient documentation

## 2023-11-08 DIAGNOSIS — Z7982 Long term (current) use of aspirin: Secondary | ICD-10-CM | POA: Insufficient documentation

## 2023-11-08 DIAGNOSIS — I272 Pulmonary hypertension, unspecified: Secondary | ICD-10-CM | POA: Insufficient documentation

## 2023-11-08 DIAGNOSIS — I5022 Chronic systolic (congestive) heart failure: Secondary | ICD-10-CM

## 2023-11-08 DIAGNOSIS — N179 Acute kidney failure, unspecified: Secondary | ICD-10-CM

## 2023-11-08 DIAGNOSIS — I493 Ventricular premature depolarization: Secondary | ICD-10-CM | POA: Insufficient documentation

## 2023-11-08 DIAGNOSIS — E1169 Type 2 diabetes mellitus with other specified complication: Secondary | ICD-10-CM

## 2023-11-08 DIAGNOSIS — H547 Unspecified visual loss: Secondary | ICD-10-CM | POA: Insufficient documentation

## 2023-11-08 DIAGNOSIS — I739 Peripheral vascular disease, unspecified: Secondary | ICD-10-CM

## 2023-11-08 DIAGNOSIS — E785 Hyperlipidemia, unspecified: Secondary | ICD-10-CM | POA: Insufficient documentation

## 2023-11-08 DIAGNOSIS — E782 Mixed hyperlipidemia: Secondary | ICD-10-CM

## 2023-11-08 DIAGNOSIS — I252 Old myocardial infarction: Secondary | ICD-10-CM | POA: Insufficient documentation

## 2023-11-08 DIAGNOSIS — I255 Ischemic cardiomyopathy: Secondary | ICD-10-CM | POA: Insufficient documentation

## 2023-11-08 DIAGNOSIS — I110 Hypertensive heart disease with heart failure: Secondary | ICD-10-CM | POA: Insufficient documentation

## 2023-11-08 DIAGNOSIS — Z139 Encounter for screening, unspecified: Secondary | ICD-10-CM

## 2023-11-08 DIAGNOSIS — I428 Other cardiomyopathies: Secondary | ICD-10-CM | POA: Insufficient documentation

## 2023-11-08 DIAGNOSIS — Z79899 Other long term (current) drug therapy: Secondary | ICD-10-CM | POA: Insufficient documentation

## 2023-11-08 DIAGNOSIS — I2510 Atherosclerotic heart disease of native coronary artery without angina pectoris: Secondary | ICD-10-CM

## 2023-11-08 DIAGNOSIS — I447 Left bundle-branch block, unspecified: Secondary | ICD-10-CM | POA: Insufficient documentation

## 2023-11-08 DIAGNOSIS — Z7902 Long term (current) use of antithrombotics/antiplatelets: Secondary | ICD-10-CM | POA: Insufficient documentation

## 2023-11-08 DIAGNOSIS — Z7984 Long term (current) use of oral hypoglycemic drugs: Secondary | ICD-10-CM | POA: Insufficient documentation

## 2023-11-08 DIAGNOSIS — Z794 Long term (current) use of insulin: Secondary | ICD-10-CM

## 2023-11-08 DIAGNOSIS — G4733 Obstructive sleep apnea (adult) (pediatric): Secondary | ICD-10-CM | POA: Insufficient documentation

## 2023-11-08 LAB — BASIC METABOLIC PANEL WITH GFR
Anion gap: 10 (ref 5–15)
BUN: 17 mg/dL (ref 6–20)
CO2: 26 mmol/L (ref 22–32)
Calcium: 9.2 mg/dL (ref 8.9–10.3)
Chloride: 106 mmol/L (ref 98–111)
Creatinine, Ser: 1.34 mg/dL — ABNORMAL HIGH (ref 0.61–1.24)
GFR, Estimated: >60 mL/min (ref >60)
Glucose, Bld: 151 mg/dL — ABNORMAL HIGH (ref 70–99)
Potassium: 4.2 mmol/L (ref 3.5–5.1)
Sodium: 142 mmol/L (ref 135–145)

## 2023-11-08 LAB — BRAIN NATRIURETIC PEPTIDE: B Natriuretic Peptide: 585.7 pg/mL — ABNORMAL HIGH (ref 0.0–100.0)

## 2023-11-08 NOTE — Patient Instructions (Addendum)
 Thank you for coming in today  If you had labs drawn today, any labs that are abnormal the clinic will call you No news is good news  Medications: No changes   Follow up appointments:  Your physician recommends that you schedule a follow-up appointment in:  6 months With Dr. Gala Romney  Please call our office to schedule the follow-up appointment in August 2025   Do the following things EVERYDAY: Weigh yourself in the morning before breakfast. Write it down and keep it in a log. Take your medicines as prescribed Eat low salt foods--Limit salt (sodium) to 2000 mg per day.  Stay as active as you can everyday Limit all fluids for the day to less than 2 liters   At the [facility], you and your health needs are our priority. As part of our continuing mission to provide you with exceptional heart care, we have created designated Provider Care Teams. These Care Teams include your primary Cardiologist (physician) and Advanced Practice Providers (APPs- Physician Assistants and Nurse Practitioners) who all work together to provide you with the care you need, when you need it.   You may see any of the following providers on your designated Care Team at your next follow up: Dr Arvilla Meres Dr Marca Ancona Dr. Marcos Eke, NP Robbie Lis, [facility] [place], [place] Brynda Peon, NP Karle Plumber, PharmD   Please be sure to bring in all your medications bottles to every appointment.    Thank you for choosing [facility]  If you have any questions or concerns before your next appointment please send [place] a message through [employer] or call our office at [phone].    TO LEAVE A MESSAGE FOR THE NURSE SELECT OPTION 2, PLEASE LEAVE A MESSAGE INCLUDING: YOUR NAME DATE OF BIRTH CALL BACK NUMBER REASON FOR CALL**this is important as we prioritize the call backs  YOU WILL RECEIVE A CALL BACK THE  SAME DAY AS LONG AS YOU CALL BEFORE 4:00 PM

## 2023-11-09 ENCOUNTER — Telehealth (HOSPITAL_COMMUNITY): Payer: Self-pay

## 2023-11-09 DIAGNOSIS — I5022 Chronic systolic (congestive) heart failure: Secondary | ICD-10-CM

## 2023-11-09 NOTE — Telephone Encounter (Signed)
 Spoke with patient regarding the following results. Patient made aware and patient verbalized understanding.   Patient will take additional potassium and additional torsemide as directed. Repeat labs ordered and scheduled for patient. Patient aware of appointment time and date. Advised patient to call back to office with any issues, questions, or concerns. Patient verbalized understanding.

## 2023-11-09 NOTE — Telephone Encounter (Signed)
-----   Message from Alen Bleacher sent at 11/08/2023  2:07 PM EDT ----- Fluid marker elevated. Increase Torsemide to 60 mg BID for 2 days. Additional 20 mEq KDUR for those 2 days.   Repeat BMET and BNP 7-10 days.

## 2023-11-16 ENCOUNTER — Ambulatory Visit (HOSPITAL_COMMUNITY)
Admission: RE | Admit: 2023-11-16 | Discharge: 2023-11-16 | Disposition: A | Discharge status: Home / Self Care | Payer: Self-pay | Source: Ambulatory Visit | Attending: Cardiology | Admitting: Cardiology

## 2023-11-16 DIAGNOSIS — I5022 Chronic systolic (congestive) heart failure: Secondary | ICD-10-CM | POA: Insufficient documentation

## 2023-11-16 LAB — BASIC METABOLIC PANEL WITH GFR
Anion gap: 12 (ref 5–15)
BUN: 41 mg/dL — ABNORMAL HIGH (ref 6–20)
CO2: 24 mmol/L (ref 22–32)
Calcium: 9 mg/dL (ref 8.9–10.3)
Chloride: 99 mmol/L (ref 98–111)
Creatinine, Ser: 1.78 mg/dL — ABNORMAL HIGH (ref 0.61–1.24)
GFR, Estimated: 44 mL/min — ABNORMAL LOW (ref >60)
Glucose, Bld: 312 mg/dL — ABNORMAL HIGH (ref 70–99)
Potassium: 3.7 mmol/L (ref 3.5–5.1)
Sodium: 135 mmol/L (ref 135–145)

## 2023-11-16 LAB — BRAIN NATRIURETIC PEPTIDE: B Natriuretic Peptide: 88.4 pg/mL (ref 0.0–100.0)

## 2023-11-19 ENCOUNTER — Telehealth (HOSPITAL_COMMUNITY): Payer: Self-pay

## 2023-11-19 DIAGNOSIS — I5022 Chronic systolic (congestive) heart failure: Secondary | ICD-10-CM

## 2023-11-19 DIAGNOSIS — I10 Essential (primary) hypertension: Secondary | ICD-10-CM

## 2023-11-19 MED ORDER — TORSEMIDE 20 MG PO TABS: 40.0000 mg | ORAL_TABLET | Freq: Every day | ORAL | Status: AC

## 2023-11-19 NOTE — Telephone Encounter (Signed)
-----   Message from Sheryl Donna sent at 11/19/2023  7:56 AM EDT ----- Renal function slightly elevated. Hold Torsemide  for 1 day and restart at 40 mg daily. Fluid marked back to normal. Can take additional Torsemide  as needed.

## 2023-11-19 NOTE — Telephone Encounter (Signed)
 Spoke with patient regarding the following results. Patient made aware and patient verbalized understanding.   Medication list updated. Advised patient to call back to office with any issues, questions, or concerns. Patient verbalized understanding.

## 2023-11-28 ENCOUNTER — Encounter: Payer: Self-pay | Admitting: Internal Medicine

## 2023-11-29 NOTE — Addendum Note (Signed)
 Addended by: Edra Govern D on: 11/29/2023 01:17 PM   Modules accepted: Orders

## 2023-11-29 NOTE — Progress Notes (Signed)
 Remote ICD transmission.

## 2023-12-01 ENCOUNTER — Other Ambulatory Visit: Payer: Self-pay | Admitting: Family Medicine

## 2023-12-01 ENCOUNTER — Other Ambulatory Visit (HOSPITAL_COMMUNITY): Payer: Self-pay | Admitting: Internal Medicine

## 2023-12-01 DIAGNOSIS — G4700 Insomnia, unspecified: Secondary | ICD-10-CM

## 2023-12-01 DIAGNOSIS — M545 Low back pain, unspecified: Secondary | ICD-10-CM

## 2024-01-14 ENCOUNTER — Ambulatory Visit (INDEPENDENT_AMBULATORY_CARE_PROVIDER_SITE_OTHER): Payer: No Typology Code available for payment source

## 2024-01-14 DIAGNOSIS — I5022 Chronic systolic (congestive) heart failure: Secondary | ICD-10-CM

## 2024-01-14 LAB — CUP PACEART REMOTE DEVICE CHECK
Battery Remaining Longevity: 116 mo
Battery Voltage: 3.01 V
Brady Statistic RV Percent Paced: 0 %
Date Time Interrogation Session: 20250616012303
HighPow Impedance: 53 Ohm
Implantable Lead Connection Status: 753985
Implantable Lead Implant Date: 20230616
Implantable Lead Location: 753860
Implantable Lead Model: 6935
Implantable Pulse Generator Implant Date: 20230616
Lead Channel Impedance Value: 342 Ohm
Lead Channel Impedance Value: 437 Ohm
Lead Channel Pacing Threshold Amplitude: 0.5 V
Lead Channel Pacing Threshold Pulse Width: 0.4 ms
Lead Channel Sensing Intrinsic Amplitude: 18.875 mV
Lead Channel Sensing Intrinsic Amplitude: 18.875 mV
Lead Channel Setting Pacing Amplitude: 2 V
Lead Channel Setting Pacing Pulse Width: 0.4 ms
Lead Channel Setting Sensing Sensitivity: 0.3 mV
Zone Setting Status: 755011

## 2024-01-17 ENCOUNTER — Ambulatory Visit: Payer: Self-pay | Admitting: Internal Medicine

## 2024-01-18 ENCOUNTER — Other Ambulatory Visit: Payer: Self-pay | Admitting: Family Medicine

## 2024-01-18 DIAGNOSIS — M545 Low back pain, unspecified: Secondary | ICD-10-CM

## 2024-01-18 DIAGNOSIS — M542 Cervicalgia: Secondary | ICD-10-CM

## 2024-02-07 ENCOUNTER — Ambulatory Visit (INDEPENDENT_AMBULATORY_CARE_PROVIDER_SITE_OTHER): Payer: Self-pay | Admitting: Orthopedic Surgery

## 2024-02-07 ENCOUNTER — Encounter: Payer: Self-pay | Admitting: Orthopedic Surgery

## 2024-02-07 DIAGNOSIS — L97521 Non-pressure chronic ulcer of other part of left foot limited to breakdown of skin: Secondary | ICD-10-CM | POA: Diagnosis not present

## 2024-02-07 DIAGNOSIS — B351 Tinea unguium: Secondary | ICD-10-CM

## 2024-02-07 NOTE — Progress Notes (Signed)
 Office Visit Note   Patient: Mitchell [name]           Date of Birth: May 21, 1971           MRN: [id number] Visit Date: 02/07/2024              Requested by: Watt Harlene BROCKS, MD [address] [place],  [place] [address] PCP: Watt Harlene BROCKS, MD  Chief Complaint  Patient presents with   Left Foot - Follow-up      HPI: Patient is a 53 year old gentleman who is seen for the left lower extremity.  Patient complains of an ulcer on the tip of the second toe as well as painful onychomycotic nails.  Assessment & Plan: Visit Diagnoses:  1. Foot ulcer, left, limited to breakdown of skin (HCC)   2. Onychomycosis     Plan: The  Follow-Up Instructions: Return in about 3 months (around 05/09/2024).   Ortho Exam  Patient is alert, oriented, no adenopathy, well-dresse ulcer was debrided and the nails were trimmed x 5.  d, normal affect, normal respiratory effort. Examination patient has thickened discolored onychomycotic nails x 5 in the left foot.  He is stable right transtibial amputation.  He also presents with a ulcer over the tip of the second toe.  After informed consent the ulcer on the top of the second toe was debrided there is good healthy viable tissue at the base.  Nails were trimmed x 5 without complication.    Imaging: No results found. No images are attached to the encounter.  Labs: Lab Results  Component Value Date   HGBA1C 8.4 (H) 05/16/2023   HGBA1C 8.3 (H) 01/22/2023   HGBA1C 10.2 (H) 10/25/2022   ESRSEDRATE 10 06/10/2023   ESRSEDRATE 20 (H) 03/08/2022   ESRSEDRATE 81 (H) 10/21/2019   CRP 10.3 (H) 01/28/2021   CRP 12.8 (H) 01/27/2021   CRP 21.1 (H) 01/26/2021   LABURIC 10.9 (H) 10/23/2022   REPTSTATUS 01/25/2023 FINAL 01/20/2023   GRAMSTAIN NO WBC SEEN NO ORGANISMS SEEN  01/20/2023   CULT  01/20/2023    RARE STAPHYLOCOCCUS CAPITIS NO ANAEROBES ISOLATED Performed at [facility], [address], [place], [place] [address]     [facility] STAPHYLOCOCCUS CAPITIS 01/20/2023     Lab Results  Component Value Date   ALBUMIN  3.7 09/23/2023   ALBUMIN  4.1 01/22/2023   ALBUMIN  4.4 11/08/2022   PREALBUMIN 6.8 (L) 10/21/2019    Lab Results  Component Value Date   MG 2.2 09/24/2023   MG 2.3 11/29/2021   MG 1.5 (L) 04/12/2021   Lab Results  Component Value Date   VD25OH 11 (L) 08/16/2009    Lab Results  Component Value Date   PREALBUMIN 6.8 (L) 10/21/2019      Latest Ref Rng & Units 09/24/2023    7:35 AM 09/23/2023   12:50 PM 06/10/2023    9:47 PM  CBC EXTENDED  WBC 4.0 - 10.5 K/uL 7.6  9.1  8.7   RBC 4.22 - 5.81 MIL/uL 4.66  4.83  4.38   Hemoglobin 13.0 - 17.0 g/dL 86.0  85.6  86.8   HCT 39.0 - 52.0 % 42.0  43.6  39.7   Platelets 150 - 400 K/uL 118  137  125   NEUT# 1.7 - 7.7 K/uL  6.9  6.6   Lymph# 0.7 - 4.0 K/uL  1.4  1.4      There is no height or weight on file to  calculate BMI.  Orders:  No orders of the defined types were placed in this encounter.  No orders of the defined types were placed in this encounter.    Procedures: No procedures performed  Clinical Data: No additional findings.  ROS:  All other systems negative, except as noted in the HPI. Review of Systems  Objective: Vital Signs: There were no vitals taken for this visit.  Specialty Comments:  No specialty comments available.  PMFS History: Patient Active Problem List   Diagnosis Date Noted   Chest pain 09/23/2023   Diarrhea 09/23/2023   History of right below knee amputation (HCC) 03/16/2023   Diabetes mellitus (HCC) 03/16/2023   BPH with obstruction/lower urinary tract symptoms 11/21/2022   UTI (urinary tract infection) 10/23/2022   ICD (implantable cardioverter-defibrillator) in place 04/19/2022   Near syncope 12/08/2021   CKD (chronic kidney disease) stage 3, GFR 30-59 ml/min (HCC) 12/08/2021   PVD s/p R LE amputation  12/08/2021   Diabetic retinopathy associated with type 2 diabetes mellitus (HCC)  06/06/2021   CAD (coronary artery disease) with flat troponin  04/11/2021   S/P below knee amputation, right (HCC) 02/07/2021   AKI (acute kidney injury) (HCC) 01/23/2021   QT prolongation 07/30/2019   Acute on chronic combined systolic and diastolic CHF (congestive heart failure) (HCC) 07/29/2019   Stroke (HCC) 05/08/2017   Uncontrolled type 2 diabetes mellitus with hyperglycemia, with long-term current use of insulin  (HCC) 12/07/2015   Chronic systolic heart failure (HCC) 10/02/2011   Organic impotence 10/02/2011   Obstructive sleep apnea 10/11/2007   HLD (hyperlipidemia) 10/10/2007   HYPOKALEMIA 10/10/2007   Obesity, Class III, BMI 40-49.9 (morbid obesity) 10/10/2007   Essential hypertension 10/10/2007   PREMATURE VENTRICULAR CONTRACTIONS 10/10/2007   History of colonic polyps 10/10/2007   Past Medical History:  Diagnosis Date   Bipolar disorder (HCC)    CAD (coronary artery disease)    a. diffuse 3v CAD by cath 2019, medical therapy recommended.   Cataract    forming    Chronic systolic CHF (congestive heart failure) (HCC)    CVA (cerebral infarction)    No residual deficits   Depression    PTSD,    Diabetes mellitus    TYPE II; UNCONTROLLED BY HEMOGLOBIN A1c; STABLE AS  PER DISCHARGE   Headache(784.0)    Herpes simplex of male genitalia    History of colonic polyps    Hyperlipidemia    Hypertension    Myocardial infarction (HCC) 1987   (while playing football)   Obesity    OSA (obstructive sleep apnea)    repeat study 2018 without significant OSA   Pneumonia    Post-cardiac injury syndrome (HCC)    History of cardiac injury from blunt trauma   Pulmonary hypertension (HCC)    a. moderately elevated PASP 07/2019.   PVCs (premature ventricular contractions)    Schizophrenia (HCC)    [facility]   Sleep apnea    Stroke [facility]) 2005   some left side weakness   Syncope    Recurrent, thought to be vasovagal. Also has h/o frequent PVCs.      Family History  Problem Relation Age of Onset   Heart disease Mother        MI   Heart failure Mother    Diabetes Mother        ALSO IN MOST OF HIS SIBLINGS; 2 UNLCES HAVE ALSO PASSED AWAY FROM DM   Cardiomyopathy Mother    Cancer -  Ovarian Mother    Ovarian cancer Mother    Heart disease Father    Hypertension Father    Diabetes Father    Diabetes Sister    Diabetes Brother    Colon cancer Paternal Uncle    Colon cancer Paternal Uncle    Colon polyps Neg Hx    Esophageal cancer Neg Hx    Rectal cancer Neg Hx    Stomach cancer Neg Hx     Past Surgical History:  Procedure Laterality Date   AMPUTATION Right 01/25/2021   Procedure: RIGHT BELOW KNEE AMPUTATION;  Surgeon: Harden Jerona GAILS, MD;  Location[facility];  Service: Orthopedics;  Laterality: Right;   CARDIAC CATHETERIZATION  12/19/2010   DIFFUSE NONOBSTRUCTIVE CAD; NONISCHEMIC CARDIOMYOPATHY; LEFT VENTRICULAR ANGIOGRAM WAS PERFORMED SECONDARY TO  ELEVATED LEFT VENTRICULAR FILLING PRESSURES   COLONOSCOPY     ~ age [age]   COLONOSCOPY W/ POLYPECTOMY     I & D EXTREMITY Bilateral 10/24/2019   Procedure: IRRIGATION AND DEBRIDEMENT BILATERAL EXTREMITY WOUND ON FOOT;  Surgeon: Elisabeth Craig RAMAN, MD;  Location: [facility] OR;  Service: Plastics;  Laterality: Bilateral;   ICD IMPLANT N/A 01/13/2022   Procedure: ICD IMPLANT;  Surgeon: Waddell Danelle ORN, MD;  Location: [facility] INVASIVE CV LAB;  Service: Cardiovascular;  Laterality: N/A;   METATARSAL HEAD EXCISION Right 08/29/2018   Procedure: METATARSAL HEAD RESECTION;  Surgeon: Janit Thresa HERO, DPM;  Location: [facility] OR;  Service: Podiatry;  Laterality: Right;   METATARSAL OSTEOTOMY Right 08/29/2018   Procedure: SUB FIFTHE METATARSIA RIGHT FOOT;  Surgeon: Janit Thresa HERO, DPM;  Location: [facility] OR;  Service: Podiatry;  Laterality: Right;   MULTIPLE EXTRACTIONS WITH ALVEOLOPLASTY  01/27/2014   all my teeth; 4 Quadrants of alveoloplasty   MULTIPLE EXTRACTIONS WITH ALVEOLOPLASTY N/A 01/27/2014   Procedure:  EXTRACTION OF TEETH #'1, 2, 3, 4, 5, 6, 7, 8, 9, 10, 11, 12, 13, 14, 15, 16, 17, 20, 21, 22, 23, 24, 25, 26, 27, 28, 29, 31 and 32 WITH ALVEOLOPLASTY;  Surgeon: Tanda JULIANNA Fanny, DDS;  Location: [facility] OR;  Service: Oral Surgery;  Laterality: N/A;   ORIF FINGER / THUMB FRACTURE Right    POLYPECTOMY     RIGHT HEART CATH N/A 01/27/2021   Procedure: RIGHT HEART CATH;  Surgeon: Cherrie Toribio SAUNDERS, MD;  Location: [facility] INVASIVE CV LAB;  Service: Cardiovascular;  Laterality: N/A;   RIGHT/LEFT HEART CATH AND CORONARY ANGIOGRAPHY N/A 09/26/2017   Procedure: RIGHT/LEFT HEART CATH AND CORONARY ANGIOGRAPHY;  Surgeon: Cherrie Toribio SAUNDERS, MD;  Location: [facility] INVASIVE CV LAB;  Service: Cardiovascular;  Laterality: N/A;   RIGHT/LEFT HEART CATH AND CORONARY ANGIOGRAPHY N/A 04/12/2021   Procedure: RIGHT/LEFT HEART CATH AND CORONARY ANGIOGRAPHY;  Surgeon: Cherrie Toribio SAUNDERS, MD;  Location: [facility] INVASIVE CV LAB;  Service: Cardiovascular;  Laterality: N/A;   SKIN SPLIT GRAFT Bilateral 10/24/2019   Procedure: SKIN GRAFT SPLIT THICKNESS LEFT THIGH;  Surgeon: Elisabeth Craig RAMAN, MD;  Location: [facility] OR;  Service: Plastics;  Laterality: Bilateral;   WOUND DEBRIDEMENT Right 08/29/2018   Procedure: Debridement of ulcer on right fifth metatarsal;  Surgeon: Janit Thresa HERO, DPM;  Location: [facility] OR;  Service: Podiatry;  Laterality: Right;   Social History   Occupational History   Occupation: Mortician  Tobacco Use   Smoking status: Never   Smokeless tobacco: Never  Vaping Use   Vaping status: Never Used  Substance and Sexual Activity   Alcohol use: No   Drug use: No   Sexual activity: Not on  file

## 2024-02-21 NOTE — Progress Notes (Signed)
 Remote ICD transmission.

## 2024-03-18 ENCOUNTER — Telehealth: Payer: Self-pay

## 2024-03-18 NOTE — Telephone Encounter (Signed)
 Alert remote transmission:  (Single Chamber RV, ICD)   AF Daily Burden > Threshold AF in progress from 8/19 @ 05:55, new onset, no OAC  1 NSVT, HR 235, 8 beats in duration  Spoke with patient, he doesn't report any symptoms.  Resent current transmission records of AF; updated presenting VS rhythm does appear more regular with a PVC.   He does have CVA history and is on Plavix .   Appt made to see Daphne Barrack, NP on 03/26/24 at 850am to discuss new onset AF, he is also past due for routine device follow up.  Will review with Dr. Waddell as I do not have atrial EGM to confirm to determine if need to go ahead and initiate OAC in the interim or wait to see Brandi.     PRESENTING EGM WITH ONGOING REPORTED AF 6AM ON 8/19:   UPDATED PRESENTING RHYTHM WITHOUT ONGOING AF 1050am on 8/19:

## 2024-03-24 NOTE — Progress Notes (Unsigned)
 Electrophysiology Office Note:   Date:  03/26/2024  ID:  Mitchell [name], DOB 1971-01-04, MRN [id number]  Primary Cardiologist: Toribio Fuel, MD Primary Heart Failure: None Electrophysiologist: Danelle Birmingham, MD       History of Present Illness:   Mitchell [name] is a 53 y.o. male with h/o HFrEF s/p ICD, CAD, HTN, HLD, PVD, CVA, DM II, CKD III, obesity seen today for routine electrophysiology follow up & new identification of suspected AF on device.   [employer] Alert on 8/19 > AF in progress since 8/19 at 0555, new onset. 1 NSVT with HR 235 bpm, 8 beats in duration. AF lasted 54 minutes.   Since last being seen in our clinic the patient reports doing well overall. He has his medication bag with him. Daughter accompanies him today.     He denies chest pain, palpitations, dyspnea, PND, orthopnea, nausea, vomiting, dizziness, syncope, edema, weight gain, or early satiety.   Review of systems complete and found to be negative unless listed in HPI.    EP Information / Studies Reviewed:    EKG is ordered today. Personal review as below.  EKG Interpretation Date/Time:  Wednesday March 26 2024 08:58:24 EDT Ventricular Rate:  84 PR Interval:  196 QRS Duration:  132 QT Interval:  430 QTC Calculation: 508 R Axis:   -54  Text Interpretation: Normal sinus rhythm Left axis deviation Confirmed by Aniceto Jarvis ([id number]) on 03/26/2024 9:04:42 AM   ICD Interrogation-  reviewed in detail today,  See PACEART report.  Device History: Medtronic Single Chamber ICD implanted 01/13/2022 for ICM/HFrEF History of appropriate therapy: No History of AAD therapy: No   Risk Assessment/Calculations:    CHA2DS2-VASc Score = 6   This indicates a 9.7% annual risk of stroke. The patient's score is based upon: CHF History: 1 HTN History: 1 Diabetes History: 1 Stroke History: 2 Vascular Disease History: 1 Age Score: 0 Gender Score: 0             Physical Exam:   VS:  BP 126/82    Pulse 84   Ht 5' 11.5 (1.816 m)   Wt 287 lb 12.8 oz (130.5 kg)   SpO2 96%   BMI 39.58 kg/m    Wt Readings from Last 3 Encounters:  03/26/24 287 lb 12.8 oz (130.5 kg)  11/08/23 290 lb 3.2 oz (131.6 kg)  09/23/23 298 lb (135.2 kg)     GEN: Well nourished, well developed in no acute distress NECK: No JVD; No carotid bruits CARDIAC: Regular rate and rhythm, no murmurs, rubs, gallops, device site wnl, no tethering  RESPIRATORY:  Clear to auscultation without rales, wheezing or rhonchi  ABDOMEN: Soft, non-tender, non-distended EXTREMITIES:  No edema; No deformity   ASSESSMENT AND PLAN:    Chronic Systolic Dysfunction ICM / NICM s/p Medtronic single chamber ICD  -euvolemic by exam / device   -Stable on an appropriate medical regimen -Normal ICD function -See Pace Art report -No changes today  New Onset AF  Identified 03/18/24 on device, episodes lasted 54 minutes. CHA2DS2-VASc 6 -pt not on OAC, has hx of CVA -begin Eliquis  5mg  BID given prior CVA hx, appropriate by age / wt -reviewed with Dr. Margaretann, stop plavix  & ASA.  Pt was actually not taking Plavix  (was not in his bag of meds) -0.1% burden on device   3V CAD, ICM / NICM  No targets for intervention  -no anginal symptoms  -follows with Dr. Fuel  Hypertension  -well controlled on  current regimen    DM  -per primary   CKD -per Nephrology   Disposition:   Follow up with Dr. Waddell / EP APP in 12 months   Signed, Daphne Barrack, NP-C, AGACNP-BC [facility] - Electrophysiology  03/26/2024, 9:04 AM

## 2024-03-26 ENCOUNTER — Ambulatory Visit: Payer: Self-pay | Attending: Pulmonary Disease | Admitting: Pulmonary Disease

## 2024-03-26 ENCOUNTER — Encounter: Payer: Self-pay | Admitting: Pulmonary Disease

## 2024-03-26 ENCOUNTER — Other Ambulatory Visit (HOSPITAL_COMMUNITY): Payer: Self-pay

## 2024-03-26 VITALS — BP 126/82 | HR 84 | Ht 71.5 in | Wt 287.8 lb

## 2024-03-26 DIAGNOSIS — I5022 Chronic systolic (congestive) heart failure: Secondary | ICD-10-CM

## 2024-03-26 DIAGNOSIS — G4700 Insomnia, unspecified: Secondary | ICD-10-CM

## 2024-03-26 DIAGNOSIS — I10 Essential (primary) hypertension: Secondary | ICD-10-CM

## 2024-03-26 DIAGNOSIS — I2510 Atherosclerotic heart disease of native coronary artery without angina pectoris: Secondary | ICD-10-CM

## 2024-03-26 DIAGNOSIS — Z95810 Presence of automatic (implantable) cardiac defibrillator: Secondary | ICD-10-CM

## 2024-03-26 LAB — CUP PACEART INCLINIC DEVICE CHECK
Date Time Interrogation Session: 20250827132653
Implantable Lead Connection Status: 753985
Implantable Lead Implant Date: 20230616
Implantable Lead Location: 753860
Implantable Lead Model: 6935
Implantable Pulse Generator Implant Date: 20230616

## 2024-03-26 MED ORDER — APIXABAN 5 MG PO TABS
5.0000 mg | ORAL_TABLET | Freq: Two times a day (BID) | ORAL | 0 refills | Status: AC
  Filled 2024-03-26: qty 60, 30d supply, fill #0

## 2024-03-26 NOTE — Patient Instructions (Signed)
 Medication Instructions:  1.Stop plavix  2.Stop aspirin  3.Start eliquis  5 mg twice daily, you will pick up a free 30 day trial of this medication downstairs at our pharmacy. *If you need a refill on your cardiac medications before your next appointment, please call your pharmacy*  Lab Work: None ordered If you have labs (blood work) drawn today and your tests are completely normal, you will receive your results only by: MyChart Message (if you have MyChart) OR A paper copy in the mail If you have any lab test that is abnormal or we need to change your treatment, we will call you to review the results  Follow-Up: At [facility], you and your health needs are our priority.  As part of our continuing mission to provide you with exceptional heart care, our providers are all part of one team.  This team includes your primary Cardiologist (physician) and Advanced Practice Providers or APPs (Physician Assistants and Nurse Practitioners) who all work together to provide you with the care you need, when you need it.  Your next appointment:   1 year(s)  Provider:   Daphne Barrack, NP   Call and schedule an appointment with Dr Cherrie.   We recommend signing up for the patient portal called MyChart.  Sign up information is provided on this After Visit Summary.  MyChart is used to connect with patients for Virtual Visits (Telemedicine).  Patients are able to view lab/test results, encounter notes, upcoming appointments, etc.  Non-urgent messages can be sent to your provider as well.   To learn more about what you can do with MyChart, go to [email or web address].

## 2024-04-08 ENCOUNTER — Other Ambulatory Visit (HOSPITAL_COMMUNITY): Payer: Self-pay | Admitting: Internal Medicine

## 2024-04-08 ENCOUNTER — Other Ambulatory Visit: Payer: Self-pay | Admitting: Family Medicine

## 2024-04-08 ENCOUNTER — Other Ambulatory Visit: Payer: Self-pay | Admitting: Internal Medicine

## 2024-04-09 ENCOUNTER — Ambulatory Visit: Payer: Self-pay | Admitting: Family Medicine

## 2024-04-09 ENCOUNTER — Other Ambulatory Visit: Payer: Self-pay | Admitting: Internal Medicine

## 2024-04-09 NOTE — Telephone Encounter (Signed)
 FYI Only or Action Required?: Action required by provider: medication refill request.  Patient was last seen in primary care on 06/15/2023 by Jason Leita Repine, FNP.  Called Nurse Triage reporting Shaking.  Triage Disposition: See Physician Within 24 Hours  Patient/caregiver understands and will follow disposition?: Yes       Copied from CRM [id number]. Topic: Clinical - Red Word Triage >> Apr 09, 2024  8:40 AM Thersia BROCKS wrote: Red Word that prompted transfer to Nurse Triage: Patient called in stated he hasn't been feeling good, has been shaking, nausea, and stool has been real soft. Patient is running out of all his medications and will need his insulin  soon . PCP soonest appointment isnt until 09/22 Reason for Disposition  [1] MODERATE diarrhea (e.g., 4-6 times / day more than normal) AND [2] present > 48 hours (2 days)  Answer Assessment - Initial Assessment Questions Pt is out of the lancets to prick his finger. Pt needs a refill of: blood glucose meter kit and supplies sent to [facility] on [address]  This RN scheduled pt for an acute visit tmrw with a different provider for the diarrhea. Pt scheduled for visit with PCP on 9/22 for general follow up. This RN told pt he needs to reach out to his endocrinologist for diabetes concerns as they refilled his insulin  today. This RN provided pt with phone number for their office. This RN also told pt to reach out to cardiologist for cardiac medication refills.  This RN educated pt on new-worsening symptoms and when to call back/seek emergent care. Pt verbalized understanding and agrees to plan.   Multiple symptoms:  Shaking intermittent- onset: 1 wk ago he noticed it; pt thinks it is when his blood sugar goes down  Nausea intermittent  Pt states he has had diarrhea and thinks its from one of his medications Notices it after he takes insulin  No blood in stool Maybe 4-5x a day but it comes and goes Onset: For a long time per pt  greater than 6 months   Pt states he does not like eating breakfast in the morning but realizes he needs to start eating breakfast so he can take his medications. Pt states he might need to go up on his insulin .   147 blood sugar this morning  Pt states he is out of a lot of his medications. Pt states the [facility] on [address] is his pharmacy and he was told he needs new prescriptions sent in. This RN let pt know the insulin  was sent in by his endocrinologist this morning and the entresto  was sent by PCP this morning.  Protocols used: Axel Lah

## 2024-04-09 NOTE — Telephone Encounter (Signed)
 Appt scheduled

## 2024-04-09 NOTE — Progress Notes (Deleted)
   Acute Office Visit  Subjective:     Patient ID: Mitchell [name], male    DOB: June 07, 1971, 53 y.o.   MRN: [id number]  No chief complaint on file.   HPI Patient is in today for diahhrea. Onset 2 days ago.  PMHx-CAD, obesity, hypertension, diabetes, OSA, bipolar disease/schizophrenia, history of prior CVA   GASTROENTERITIS Duration: Diarrhea: {Blank single:19197::yes,no} {Blank single:19197::bloody,non-bloody}  Episodes of diarrhea/day:  Nausea: {Blank single:19197::yes,no} Vomiting: {Blank single:19197::yes,no} Episodes of vomit/day:  Abdominal pain: {Blank single:19197::yes,no} Abdominal pain location: Fever: {Blank single:19197::yes,no} Decreased appetite: {Blank single:19197::yes,no} Tolerating liquids: {Blank single:19197::yes,no} Foreign travel: {Blank single:19197::yes,no} Relevant dietary history:  Similar illness in contacts: {Blank single:19197::yes,no} Recent antibiotic use: {Blank single:19197::yes,no} Status: {Blank multiple:19196::better,worse,stable,fluctuating} Treatments attempted:    ROS      Objective:    There were no vitals taken for this visit. {Vitals History (Optional):23777}  Physical Exam  No results found for any visits on 04/10/24.      Assessment & Plan:   Problem List Items Addressed This Visit   None   Suspect viral gastroenteritis; conservative management recommended. Encourage adequate oral hydration with water  or electrolyte solutions and follow a bland diet (BRAT) advancing as tolerated. Avoid greasy, spicy, or dairy-heavy foods until symptoms improve. Pepto-Bismol (bismuth subsalicylate) may be used as needed for mild nausea, upset stomach, or non-bloody diarrhea, provided there is no aspirin  allergy and patient is not on anticoagulation. Avoid anti-diarrheal medications like loperamide  if fever, bloody stools, or concern for bacterial etiology. Educated patient on red flag  symptoms including worsening abdominal pain, bloody stools, persistent vomiting, fever >101F, or signs of dehydration, and advised follow-up if symptoms persist beyond seven days or worsen.    No orders of the defined types were placed in this encounter.   No follow-ups on file.  Ladesha Pacini L Dlisa Barnwell, NP

## 2024-04-10 ENCOUNTER — Ambulatory Visit: Payer: Self-pay | Admitting: Student

## 2024-04-10 ENCOUNTER — Encounter: Payer: Self-pay | Admitting: *Deleted

## 2024-04-14 ENCOUNTER — Encounter: Payer: Self-pay | Admitting: Internal Medicine

## 2024-04-14 ENCOUNTER — Ambulatory Visit: Payer: No Typology Code available for payment source | Attending: Internal Medicine

## 2024-04-14 ENCOUNTER — Ambulatory Visit (INDEPENDENT_AMBULATORY_CARE_PROVIDER_SITE_OTHER): Admitting: Internal Medicine

## 2024-04-14 VITALS — BP 134/80 | HR 96 | Ht 71.5 in | Wt 285.0 lb

## 2024-04-14 DIAGNOSIS — E1159 Type 2 diabetes mellitus with other circulatory complications: Secondary | ICD-10-CM

## 2024-04-14 DIAGNOSIS — Z794 Long term (current) use of insulin: Secondary | ICD-10-CM | POA: Diagnosis not present

## 2024-04-14 DIAGNOSIS — E1122 Type 2 diabetes mellitus with diabetic chronic kidney disease: Secondary | ICD-10-CM | POA: Diagnosis not present

## 2024-04-14 DIAGNOSIS — N521 Erectile dysfunction due to diseases classified elsewhere: Secondary | ICD-10-CM

## 2024-04-14 DIAGNOSIS — I5022 Chronic systolic (congestive) heart failure: Secondary | ICD-10-CM

## 2024-04-14 DIAGNOSIS — E1165 Type 2 diabetes mellitus with hyperglycemia: Secondary | ICD-10-CM

## 2024-04-14 DIAGNOSIS — E1142 Type 2 diabetes mellitus with diabetic polyneuropathy: Secondary | ICD-10-CM | POA: Diagnosis not present

## 2024-04-14 DIAGNOSIS — N1831 Chronic kidney disease, stage 3a: Secondary | ICD-10-CM

## 2024-04-14 LAB — POCT GLYCOSYLATED HEMOGLOBIN (HGB A1C): Hemoglobin A1C: 10.9 % — AB (ref 4.0–5.6)

## 2024-04-14 MED ORDER — EMPAGLIFLOZIN 10 MG PO TABS: 10.0000 mg | ORAL_TABLET | Freq: Every day | ORAL | 3 refills | Status: DC

## 2024-04-14 MED ORDER — GLIPIZIDE 5 MG PO TABS: 5.0000 mg | ORAL_TABLET | Freq: Two times a day (BID) | ORAL | 3 refills | Status: AC

## 2024-04-14 MED ORDER — BD PEN NEEDLE MICRO ULTRAFINE 32G X 6 MM MISC: 1.0000 | Freq: Every day | 3 refills | Status: DC

## 2024-04-14 MED ORDER — BASAGLAR KWIKPEN 100 UNIT/ML ~~LOC~~ SOPN: 36.0000 [IU] | PEN_INJECTOR | Freq: Every day | SUBCUTANEOUS | 3 refills | Status: AC

## 2024-04-14 NOTE — Patient Instructions (Addendum)
 Restart  GLipizide , 1 tablet before breakfast and 1 tablet before supper Take Jardiance  10 mg, 1 tablet every morning  Continue  Basaglar  36 units once daily     HOW TO TREAT LOW BLOOD SUGARS (Blood sugar LESS THAN 70 MG/DL) Please follow the RULE OF 15 for the treatment of hypoglycemia treatment (when your (blood sugars are less than 70 mg/dL)   STEP 1: Take 15 grams of carbohydrates when your blood sugar is low, which includes:  3-4 GLUCOSE TABS  OR 3-4 OZ OF JUICE OR REGULAR SODA OR ONE TUBE OF GLUCOSE GEL    STEP 2: RECHECK blood sugar in 15 MINUTES STEP 3: If your blood sugar is still low at the 15 minute recheck --> then, go back to STEP 1 and treat AGAIN with another 15 grams of carbohydrates.

## 2024-04-14 NOTE — Progress Notes (Signed)
 Name: Mitchell [name]  Age/ Sex: 53 y.o., male   MRN/ DOB: [id number], [birth year]     PCP: Watt Harlene BROCKS, MD   Reason for Endocrinology Evaluation: Type 2 Diabetes Mellitus  Initial Endocrine Consultative Visit: 09/13/2017    PATIENT IDENTIFIER: Mitchell [name] is a 53 y.o. male with a past medical history of DM, OSA, CAD, Hx CVA and CHF. The patient has followed with Endocrinology clinic since 09/13/2017 for consultative assistance with management of his diabetes.  DIABETIC HISTORY:  Mr. Falzone was diagnosed with DM 1981, Trulicity -nausea and vomiting ,he was started on insulin  and in 2017. His hemoglobin A1c has ranged from 7.8% in 2023, peaking at 12.7% in 2020.   He was followed by Dr. Kassie from 2019 until 07/2021  On his initial visit with me August, 2024 he had an A1c of 8.3%, he was on glipizide  XL, and Basaglar .  I switched glipizide  XL to a regular glipizide  twice daily, started him on Jardiance  and continued basal insulin    SUBJECTIVE:   During the last visit (03/16/2023): A1c 8.3%    Today (04/14/2024): [name] is here for follow-up on diabetes management.  He has NOT been to our clinic in 13 months.  He checks his blood glucose1x daily .  the patient has not had hypoglycemic episodes since the last clinic visit.    Patient follows with cardiology for CHF, HTN, and dyslipidemia as well as A-fib  The patient is mainly complaining of loss of  his man heard and how it is creating marital issues He does endorse occasional nausea Denies constipation but has noted loose stools  On disability    HOME DIABETES REGIMEN:  Glipizide  5 mg BID - not taking  Jardiance  25 mg daily- not taking  Basaglar  36 units daily   Statin: yes ACE-I/ARB: yes    METER DOWNLOAD SUMMARY:  114-312 mg/dL    DIABETIC COMPLICATIONS: Microvascular complications:  CKD III, neuropathy, DR B/L (  S/P RBK amputation 2021) Denies:  Last Eye Exam: Completed  2024  Macrovascular complications:  CHF, CVA, CAD Denies:PVD   HISTORY:  Past Medical History:  Past Medical History:  Diagnosis Date   Bipolar disorder (HCC)    CAD (coronary artery disease)    a. diffuse 3v CAD by cath 2019, medical therapy recommended.   Cataract    forming    Chronic systolic CHF (congestive heart failure) (HCC)    CVA (cerebral infarction)    No residual deficits   Depression    PTSD,    Diabetes mellitus    TYPE II; UNCONTROLLED BY HEMOGLOBIN A1c; STABLE AS  PER DISCHARGE   Headache(784.0)    Herpes simplex of male genitalia    History of colonic polyps    Hyperlipidemia    Hypertension    Myocardial infarction (HCC) 1987   (while playing football)   Obesity    OSA (obstructive sleep apnea)    repeat study 2018 without significant OSA   Pneumonia    Post-cardiac injury syndrome (HCC)    History of cardiac injury from blunt trauma   Pulmonary hypertension (HCC)    a. moderately elevated PASP 07/2019.   PVCs (premature ventricular contractions)    Schizophrenia (HCC)    Goes to [facility]   Sleep apnea    Stroke [facility]) 2005   some left side weakness   Syncope    Recurrent, thought to be vasovagal. Also has h/o frequent PVCs.    Past Surgical  History:  Past Surgical History:  Procedure Laterality Date   AMPUTATION Right 01/25/2021   Procedure: RIGHT BELOW KNEE AMPUTATION;  Surgeon: Harden Jerona GAILS, MD;  Location: [facility] OR;  Service: Orthopedics;  Laterality: Right;   CARDIAC CATHETERIZATION  12/19/2010   DIFFUSE NONOBSTRUCTIVE CAD; NONISCHEMIC CARDIOMYOPATHY; LEFT VENTRICULAR ANGIOGRAM WAS PERFORMED SECONDARY TO  ELEVATED LEFT VENTRICULAR FILLING PRESSURES   COLONOSCOPY     ~ age [age]   COLONOSCOPY W/ POLYPECTOMY     I & D EXTREMITY Bilateral 10/24/2019   Procedure: IRRIGATION AND DEBRIDEMENT BILATERAL EXTREMITY WOUND ON FOOT;  Surgeon: Elisabeth Craig RAMAN, MD;  Location: [facility] OR;  Service: Plastics;  Laterality: Bilateral;   ICD  IMPLANT N/A 01/13/2022   Procedure: ICD IMPLANT;  Surgeon: Waddell Danelle ORN, MD;  Location: [facility] INVASIVE CV LAB;  Service: Cardiovascular;  Laterality: N/A;   METATARSAL HEAD EXCISION Right 08/29/2018   Procedure: METATARSAL HEAD RESECTION;  Surgeon: Janit Thresa HERO, DPM;  Location: [facility] OR;  Service: Podiatry;  Laterality: Right;   METATARSAL OSTEOTOMY Right 08/29/2018   Procedure: SUB FIFTHE METATARSIA RIGHT FOOT;  Surgeon: Janit Thresa HERO, DPM;  Location: [facility] OR;  Service: Podiatry;  Laterality: Right;   MULTIPLE EXTRACTIONS WITH ALVEOLOPLASTY  01/27/2014   all my teeth; 4 Quadrants of alveoloplasty   MULTIPLE EXTRACTIONS WITH ALVEOLOPLASTY N/A 01/27/2014   Procedure: EXTRACTION OF TEETH #'1, 2, 3, 4, 5, 6, 7, 8, 9, 10, 11, 12, 13, 14, 15, 16, 17, 20, 21, 22, 23, 24, 25, 26, 27, 28, 29, 31 and 32 WITH ALVEOLOPLASTY;  Surgeon: Tanda JULIANNA Fanny, DDS;  Location: [facility] OR;  Service: Oral Surgery;  Laterality: N/A;   ORIF FINGER / THUMB FRACTURE Right    POLYPECTOMY     RIGHT HEART CATH N/A 01/27/2021   Procedure: RIGHT HEART CATH;  Surgeon: Cherrie Toribio SAUNDERS, MD;  Location: [facility] INVASIVE CV LAB;  Service: Cardiovascular;  Laterality: N/A;   RIGHT/LEFT HEART CATH AND CORONARY ANGIOGRAPHY N/A 09/26/2017   Procedure: RIGHT/LEFT HEART CATH AND CORONARY ANGIOGRAPHY;  Surgeon: Cherrie Toribio SAUNDERS, MD;  Location: [facility] INVASIVE CV LAB;  Service: Cardiovascular;  Laterality: N/A;   RIGHT/LEFT HEART CATH AND CORONARY ANGIOGRAPHY N/A 04/12/2021   Procedure: RIGHT/LEFT HEART CATH AND CORONARY ANGIOGRAPHY;  Surgeon: Cherrie Toribio SAUNDERS, MD;  Location: [facility] INVASIVE CV LAB;  Service: Cardiovascular;  Laterality: N/A;   SKIN SPLIT GRAFT Bilateral 10/24/2019   Procedure: SKIN GRAFT SPLIT THICKNESS LEFT THIGH;  Surgeon: Elisabeth Craig RAMAN, MD;  Location: [facility] OR;  Service: Plastics;  Laterality: Bilateral;   WOUND DEBRIDEMENT Right 08/29/2018   Procedure: Debridement of ulcer on right fifth metatarsal;  Surgeon: Janit Thresa HERO,  DPM;  Location: [facility] OR;  Service: Podiatry;  Laterality: Right;   Social History:  reports that he has never smoked. He has never used smokeless tobacco. He reports that he does not drink alcohol and does not use drugs. Family History:  Family History  Problem Relation Age of Onset   Heart disease Mother        MI   Heart failure Mother    Diabetes Mother        ALSO IN MOST OF HIS SIBLINGS; 2 UNLCES HAVE ALSO PASSED AWAY FROM DM   Cardiomyopathy Mother    Cancer - Ovarian Mother    Ovarian cancer Mother    Heart disease Father    Hypertension Father    Diabetes Father    Diabetes Sister    Diabetes Brother    Colon  cancer Paternal Uncle    Colon cancer Paternal Uncle    Colon polyps Neg Hx    Esophageal cancer Neg Hx    Rectal cancer Neg Hx    Stomach cancer Neg Hx      HOME MEDICATIONS: Allergies as of 04/14/2024       Reactions   Farxiga  [dapagliflozin ] Other (See Comments)   Patient reports loss of consciousness   Nsaids Anaphylaxis, Rash   Able to tolerate aspirin     Trulicity  [dulaglutide ] Nausea And Vomiting        Medication List        Accurate as of April 14, 2024  7:40 AM. If you have any questions, ask your nurse or doctor.          Accu-Chek Guide test strip Generic drug: glucose blood Use to test blood sugars up to 4 times daily as needed.   Accu-Chek Guide w/Device Kit Use to test blood sugar up to 4 times daily as needed.   Accu-Chek Softclix Lancets lancets Use to test blood sugars up to 4 times daily as needed.   acetaminophen -codeine  300-30 MG tablet Commonly known as: TYLENOL  #3 Take 1 tablet by mouth every 6 (six) hours as needed for moderate pain.   amitriptyline  10 MG tablet Commonly known as: ELAVIL  Take 1 tablet (10 mg total) by mouth at bedtime.   Basaglar  KwikPen 100 UNIT/ML INJECT 36 UNITS SUBCUTANEOUSLY DAILY   BD Pen Needle Micro Ultrafine 32G X 6 MM Misc Generic drug: Insulin  Pen Needle USE DAILY IN THE  AFTERNOON.   blood glucose meter kit and supplies Dispense based on patient and insurance preference. Use up to four times daily as directed. (FOR ICD-10 E10.9, E11.9).   carvedilol  6.25 MG tablet Commonly known as: COREG  TAKE 1 TABLET BY MOUTH TWICE DAILY WITH A MEAL   Eliquis  5 MG Tabs tablet Generic drug: apixaban  Take 1 tablet (5 mg total) by mouth 2 (two) times daily.   empagliflozin  25 MG Tabs tablet Commonly known as: Jardiance  Take 1 tablet (25 mg total) by mouth daily before breakfast.   gabapentin  100 MG capsule Commonly known as: NEURONTIN  Take 1 capsule (100 mg total) by mouth every evening.   glipiZIDE  5 MG tablet Commonly known as: GLUCOTROL  Take 1 tablet (5 mg total) by mouth 2 (two) times daily before a meal.   isosorbide  mononitrate 30 MG 24 hr tablet Commonly known as: IMDUR  Take 1 tablet (30 mg total) by mouth at bedtime.   loperamide  2 MG capsule Commonly known as: IMODIUM  Take 1 capsule (2 mg total) by mouth as needed for diarrhea or loose stools.   methocarbamol  500 MG tablet Commonly known as: ROBAXIN  TAKE 1 TABLET BY MOUTH EVERY 6 HOURS AS NEEDED FOR MUSCLE SPASM   nitroGLYCERIN  0.4 MG SL tablet Commonly known as: NITROSTAT  Place 1 tablet (0.4 mg total) under the tongue every 5 (five) minutes as needed for chest pain.   pantoprazole  40 MG tablet Commonly known as: PROTONIX  Take 1 tablet (40 mg total) by mouth daily as needed (acid reflux).   potassium chloride  SA 20 MEQ tablet Commonly known as: KLOR-CON  M TAKE 1 TABLET BY MOUTH EVERY OTHER DAY   rosuvastatin  40 MG tablet Commonly known as: CRESTOR  Take 1 tablet by mouth once daily   sacubitril -valsartan  49-51 MG Commonly known as: ENTRESTO  Take 1 tablet by mouth 2 (two) times daily. Needs appt   spironolactone  25 MG tablet Commonly known as: ALDACTONE  Take 1 tablet by mouth  once daily   tadalafil  20 MG tablet Commonly known as: CIALIS  Take 1 tablet (20 mg total) by mouth daily as  needed for erectile dysfunction.   tamsulosin  0.4 MG Caps capsule Commonly known as: FLOMAX  Take 1 capsule (0.4 mg total) by mouth daily after supper.   torsemide  20 MG tablet Commonly known as: DEMADEX  Take 2 tablets (40 mg total) by mouth daily.   vitamin C 1000 MG tablet Take 1,000 mg by mouth daily.   VITAMIN D-3 PO Take 5,000 Units by mouth daily.         OBJECTIVE:   Vital Signs: BP 134/80 (BP Location: Left Arm, Patient Position: Sitting, Cuff Size: Normal)   Pulse 96   Ht 5' 11.5 (1.816 m)   Wt 285 lb (129.3 kg)   SpO2 97%   BMI 39.20 kg/m   Wt Readings from Last 3 Encounters:  04/14/24 285 lb (129.3 kg)  03/26/24 287 lb 12.8 oz (130.5 kg)  11/08/23 290 lb 3.2 oz (131.6 kg)     Exam: General: Pt appears well and is in NAD  Lungs: Clear with good BS bilat   Heart: RRR   Extremities: RBKA   Neuro: MS is good with appropriate affect, pt is alert and Ox3    DM foot exam: 04/14/2024    Right BKA Left foot exam shows hypopigmented skin, deformed toes The pedal pulse undetectable  the sensation is intact to a screening 5.07, 10 gram monofilament on the left        DATA REVIEWED:  Lab Results  Component Value Date   HGBA1C 8.4 (H) 05/16/2023   HGBA1C 8.3 (H) 01/22/2023   HGBA1C 10.2 (H) 10/25/2022    Latest Reference Range & Units 11/16/23 08:53  Sodium 135 - 145 mmol/L 135  Potassium 3.5 - 5.1 mmol/L 3.7  Chloride 98 - 111 mmol/L 99  CO2 22 - 32 mmol/L 24  Glucose 70 - 99 mg/dL 687 (H)  BUN 6 - 20 mg/dL 41 (H)  Creatinine 9.38 - 1.24 mg/dL 8.21 (H)  Calcium  8.9 - 10.3 mg/dL 9.0  Anion gap 5 - 15  12  GFR, Estimated >60 mL/min 44 (L)    Old records , labs and images have been reviewed.    ASSESSMENT / PLAN / RECOMMENDATIONS:   1) Type 2 Diabetes Mellitus, Poorly controlled, With CKD III, retinopathic neuropathic and macrovascular complications - Most recent A1c of 10.9 %. Goal A1c < 7.0 %.    -Patient with worsening glycemic control,  he has not been to our clinic in over a year, and his conversation today was focused on the loss of man heard, patient is blaming his medications on the loss of manhood.  Patient complaining about doctors not checking on his medications, doctors are not telling him side effects to medications etc. -I did explain to the patient that erectile dysfunction is common due to poorly controlled DM, unfortunately the patient does have multiple complications from uncontrolled DM -He initially stated that he is taking glipizide , but when I looked up his bottle he was dispensed #180 August, 2024, and he still has multiple pills in the bottle, I explained to the patient that this indicates that he has not been taking glipizide  as prescribed???  -Patient reports syncope with Farxiga , I suspect this may be related to hypoglycemia -Intolerant to Trulicity  due to nausea and vomiting - I prescribed Jardiance  on the last visit here, patient is not aware of this medication?  Despite writing it on  his AVS at the last visit.  I have recommended restarting Jardiance  again, -Patient declines prandial insulin  - I explained to the patient the importance of checking with the pharmacy on his medication list and communicating with us  with any issues  MEDICATIONS:  Take glipizide  5 mg twice daily Restart Jardiance  10 mg daily Basaglar  36 units daily  EDUCATION / INSTRUCTIONS: BG monitoring instructions: Patient is instructed to check his blood sugars 1 times a day. Call [place] [facility] if: BG persistently < 70  I reviewed the Rule of 15 for the treatment of hypoglycemia in detail with the patient. Literature supplied.    2) Diabetic complications:  Eye: Does  have known diabetic retinopathy.  Neuro/ Feet: Does  have known diabetic peripheral neuropathy .  Renal: Patient does have known baseline CKD. He   is  on an ACEI/ARB at present.   3) Erectile dysfunction:  -I explained to the patient that my focus  is on managing his diabetes at this time, he had already establish care with urology in [facility], and your referral has been placed -Again emphasized the importance of optimizing glucose control     F/U in 3 months     Signed electronically by: Stefano Redgie Butts, MD  [facility] Endocrinology  [facility] [address] [place] [place], [place] [address] Phone: [phone] FAX: [phone]   CC: Watt Harlene BROCKS, MD [address] [place] [address] Phone: [phone]  Fax: [phone]  Return to Endocrinology clinic as below: Future Appointments  Date Time Provider Department Center  04/21/2024  1:00 PM [name], Harlene BROCKS, MD [id number] [id number] [facility]  07/14/2024  7:05 AM [facility] H[facility] DEVICE REMOTES [facility]  10/13/2024  7:05 AM [facility] DEVICE REMOTES [facility]  01/12/2025  7:05 AM [facility] DEVICE REMOTES [facility]  04/13/2025  7:05 AM [facility] DEVICE REMOTES [facility]  07/13/2025  7:05 AM [facility] HVT DEVICE REMOTES [facility]

## 2024-04-15 LAB — CUP PACEART REMOTE DEVICE CHECK
Battery Remaining Longevity: 112 mo
Battery Voltage: 3.01 V
Brady Statistic RV Percent Paced: 0 %
Date Time Interrogation Session: 20250915012302
HighPow Impedance: 59 Ohm
Implantable Lead Connection Status: 753985
Implantable Lead Implant Date: 20230616
Implantable Lead Location: 753860
Implantable Lead Model: 6935
Implantable Pulse Generator Implant Date: 20230616
Lead Channel Impedance Value: 342 Ohm
Lead Channel Impedance Value: 475 Ohm
Lead Channel Pacing Threshold Amplitude: 0.5 V
Lead Channel Pacing Threshold Pulse Width: 0.4 ms
Lead Channel Sensing Intrinsic Amplitude: 22.5 mV
Lead Channel Sensing Intrinsic Amplitude: 22.5 mV
Lead Channel Setting Pacing Amplitude: 2 V
Lead Channel Setting Pacing Pulse Width: 0.4 ms
Lead Channel Setting Sensing Sensitivity: 0.3 mV
Zone Setting Status: 755011

## 2024-04-18 ENCOUNTER — Ambulatory Visit: Payer: Self-pay | Admitting: Internal Medicine

## 2024-04-18 NOTE — Progress Notes (Deleted)
 [facility] [address], [address] [place], [place] [address] [phone]  Date:  04/21/2024   Name:  Mitchell [name]   DOB:  07/20/1971   MRN:  [id number]  PCP:  Watt Harlene BROCKS, MD    Chief Complaint: No chief complaint on file.   History of Present Illness:  Mitchell [name] is a 53 y.o. very pleasant male patient who presents with the following:  Patient seen today for follow-up-I saw him most recently in October, about 1 year ago History of CVA, schizophrenia versus bipolar disorder, OSA, obesity, CAD, HTN, hyperlipidemia, DM with history of right BKA, CRI  Noted renal insufficiency- pt notes he does have nephrology care at [facility].  ?  When was his most recent nephrology visit  He saw his endocrinologist, Dr. Sam on 9/15-it looks like at that time he was mostly concerned about erectile dysfunction and had not been taking his medications.  He does have a referral to urology DIABETIC HISTORY:  Mitchell [name] was diagnosed with DM 1981, Trulicity -nausea and vomiting ,he was started on insulin  and in 2017. His hemoglobin A1c has ranged from 7.8% in 2023, peaking at 12.7% in 2020. MEDICATIONS: Take glipizide  5 mg twice daily Restart Jardiance  10 mg daily Basaglar  36 units daily   He was seen by orthopedic surgery, Dr. Harden in July-unfortunately he does have a mild skin ulcer on his left lower extremity, he has status post right BKA  He was seen in heart failure clinic in April ASSESSMENT & PLAN:  Chronic Systolic HF /Mixed ischemic/nonischemic cardiomyopathy  - Echo (6/22): EF 20 to 25%, stable from prior exams dating back to 67  - R/LHC (9/22): Severe 3v CAD (no targets for revascularization), mild pulm venous HTN with preserved CO. - Echo (2/23): EF 30-35%, RV normal. IVC not dilated  - Echo 10/24: EF 30-35%, LV with RWMA, RV not well seen, no MR.  - He has LBBB but QRS <150 ms. Not candidate for CRT-D.   - s/p MDT ICD - NYHA II stable. Volume status ok today. - Continue torsemide  60 mg daily + 20 KCL QOD. - Continue Coreg  6.25 mg bid. - Continue Entresto  49/51 mg bid. - Continue spiro 12.5 mg daily. - Continue Jardiance  25 mg daily. No GU symptoms. - Labs today  2. H/o AKI - Baseline Scr ~1.3-1.5.  - BMET today. 3. CAD/NSTEMI 09/22 - Last Cath with progressive 3v CAD as above. No targets for intervention.  - No ischemic CP - Continue DAPT with aspirin  + plavix , statin  - Continue beta blocker. 4. Hypertension - Stable.  - GDMT as above. 5. HLD  - Continue rosuvastatin . - Lipids followed by PCP, last LDL 100 11/23  6. DM2 - Last A1c 8.4  - He is on insulin , management per PCP 7. PVD  - s/p R BKA  - Diabetic foot wound complicated by sepsis that led to amputation on 01/25/2021 - Follows closely with Dr. Harden - Continue aspirin  and statin.    - Flu vaccine - Recommend COVID booster - Eye exam  Discussed the use of AI scribe software for clinical note transcription with the patient, who gave verbal consent to proceed.  History of Present Illness    Patient Active Problem List   Diagnosis Date Noted   Chest pain 09/23/2023   Diarrhea 09/23/2023   History of right below knee amputation (HCC) 03/16/2023   Diabetes mellitus (HCC) 03/16/2023  BPH with obstruction/lower urinary tract symptoms 11/21/2022   UTI (urinary tract infection) 10/23/2022   ICD (implantable cardioverter-defibrillator) in place 04/19/2022   Near syncope 12/08/2021   CKD (chronic kidney disease) stage 3, GFR 30-59 ml/min (HCC) 12/08/2021   PVD s/p R LE amputation  12/08/2021   Diabetic retinopathy associated with type 2 diabetes mellitus (HCC) 06/06/2021   CAD (coronary artery disease) with flat troponin  04/11/2021   S/P below knee amputation, right (HCC) 02/07/2021   AKI (acute kidney injury) (HCC) 01/23/2021   QT prolongation 07/30/2019   Acute on chronic combined systolic and diastolic CHF  (congestive heart failure) (HCC) 07/29/2019   Stroke (HCC) 05/08/2017   Uncontrolled type 2 diabetes mellitus with hyperglycemia, with long-term current use of insulin  (HCC) 12/07/2015   Chronic systolic heart failure (HCC) 10/02/2011   Organic impotence 10/02/2011   Obstructive sleep apnea 10/11/2007   HLD (hyperlipidemia) 10/10/2007   HYPOKALEMIA 10/10/2007   Obesity, Class III, BMI 40-49.9 (morbid obesity) 10/10/2007   Essential hypertension 10/10/2007   PREMATURE VENTRICULAR CONTRACTIONS 10/10/2007   History of colonic polyps 10/10/2007    Past Medical History:  Diagnosis Date   Bipolar disorder (HCC)    CAD (coronary artery disease)    a. diffuse 3v CAD by cath 2019, medical therapy recommended.   Cataract    forming    Chronic systolic CHF (congestive heart failure) (HCC)    CVA (cerebral infarction)    No residual deficits   Depression    PTSD,    Diabetes mellitus    TYPE II; UNCONTROLLED BY HEMOGLOBIN A1c; STABLE AS  PER DISCHARGE   Headache(784.0)    Herpes simplex of male genitalia    History of colonic polyps    Hyperlipidemia    Hypertension    Myocardial infarction (HCC) 1987   (while playing football)   Obesity    OSA (obstructive sleep apnea)    repeat study 2018 without significant OSA   Pneumonia    Post-cardiac injury syndrome (HCC)    History of cardiac injury from blunt trauma   Pulmonary hypertension (HCC)    a. moderately elevated PASP 07/2019.   PVCs (premature ventricular contractions)    Schizophrenia (HCC)    Goes to [facility]   Sleep apnea    Stroke [facility]) 2005   some left side weakness   Syncope    Recurrent, thought to be vasovagal. Also has h/o frequent PVCs.     Past Surgical History:  Procedure Laterality Date   AMPUTATION Right 01/25/2021   Procedure: RIGHT BELOW KNEE AMPUTATION;  Surgeon: Mitchell Jerona GAILS, MD;  Location: [facility] OR;  Service: Orthopedics;  Laterality: Right;   CARDIAC CATHETERIZATION  12/19/2010    DIFFUSE NONOBSTRUCTIVE CAD; NONISCHEMIC CARDIOMYOPATHY; LEFT VENTRICULAR ANGIOGRAM WAS PERFORMED SECONDARY TO  ELEVATED LEFT VENTRICULAR FILLING PRESSURES   COLONOSCOPY     ~ age [age]   COLONOSCOPY W/ POLYPECTOMY     I & D EXTREMITY Bilateral 10/24/2019   Procedure: IRRIGATION AND DEBRIDEMENT BILATERAL EXTREMITY WOUND ON FOOT;  Surgeon: Elisabeth Craig RAMAN, MD;  Location: [facility] OR;  Service: Plastics;  Laterality: Bilateral;   ICD IMPLANT N/A 01/13/2022   Procedure: ICD IMPLANT;  Surgeon: Waddell Danelle ORN, MD;  Location: [facility] INVASIVE CV LAB;  Service: Cardiovascular;  Laterality: N/A;   METATARSAL HEAD EXCISION Right 08/29/2018   Procedure: METATARSAL HEAD RESECTION;  Surgeon: Janit Thresa HERO, DPM;  Location: [facility] OR;  Service: Podiatry;  Laterality: Right;   METATARSAL OSTEOTOMY Right  08/29/2018   Procedure: SUB FIFTHE METATARSIA RIGHT FOOT;  Surgeon: Janit Thresa HERO, DPM;  Location: [facility] OR;  Service: Podiatry;  Laterality: Right;   MULTIPLE EXTRACTIONS WITH ALVEOLOPLASTY  01/27/2014   all my teeth; 4 Quadrants of alveoloplasty   MULTIPLE EXTRACTIONS WITH ALVEOLOPLASTY N/A 01/27/2014   Procedure: EXTRACTION OF TEETH #'1, 2, 3, 4, 5, 6, 7, 8, 9, 10, 11, 12, 13, 14, 15, 16, 17, 20, 21, 22, 23, 24, 25, 26, 27, 28, 29, 31 and 32 WITH ALVEOLOPLASTY;  Surgeon: Tanda JULIANNA Fanny, DDS;  Location: [facility] OR;  Service: Oral Surgery;  Laterality: N/A;   ORIF FINGER / THUMB FRACTURE Right    POLYPECTOMY     RIGHT HEART CATH N/A 01/27/2021   Procedure: RIGHT HEART CATH;  Surgeon: Cherrie Toribio SAUNDERS, MD;  Location: [facility] INVASIVE CV LAB;  Service: Cardiovascular;  Laterality: N/A;   RIGHT/LEFT HEART CATH AND CORONARY ANGIOGRAPHY N/A 09/26/2017   Procedure: RIGHT/LEFT HEART CATH AND CORONARY ANGIOGRAPHY;  Surgeon: Cherrie Toribio SAUNDERS, MD;  Location: [facility] INVASIVE CV LAB;  Service: Cardiovascular;  Laterality: N/A;   RIGHT/LEFT HEART CATH AND CORONARY ANGIOGRAPHY N/A 04/12/2021   Procedure: RIGHT/LEFT HEART CATH AND CORONARY  ANGIOGRAPHY;  Surgeon: Cherrie Toribio SAUNDERS, MD;  Location: [facility] INVASIVE CV LAB;  Service: Cardiovascular;  Laterality: N/A;   SKIN SPLIT GRAFT Bilateral 10/24/2019   Procedure: SKIN GRAFT SPLIT THICKNESS LEFT THIGH;  Surgeon: Elisabeth Craig RAMAN, MD;  Location: [facility] OR;  Service: Plastics;  Laterality: Bilateral;   WOUND DEBRIDEMENT Right 08/29/2018   Procedure: Debridement of ulcer on right fifth metatarsal;  Surgeon: Janit Thresa HERO, DPM;  Location: [facility] OR;  Service: Podiatry;  Laterality: Right;    Social History   Tobacco Use   Smoking status: Never   Smokeless tobacco: Never  Vaping Use   Vaping status: Never Used  Substance Use Topics   Alcohol use: No   Drug use: No    Family History  Problem Relation Age of Onset   Heart disease Mother        MI   Heart failure Mother    Diabetes Mother        ALSO IN MOST OF HIS SIBLINGS; 2 UNLCES HAVE ALSO PASSED AWAY FROM DM   Cardiomyopathy Mother    Cancer - Ovarian Mother    Ovarian cancer Mother    Heart disease Father    Hypertension Father    Diabetes Father    Diabetes Sister    Diabetes Brother    Colon cancer Paternal Uncle    Colon cancer Paternal Uncle    Colon polyps Neg Hx    Esophageal cancer Neg Hx    Rectal cancer Neg Hx    Stomach cancer Neg Hx     Allergies  Allergen Reactions   Farxiga  [Dapagliflozin ] Other (See Comments)    Patient reports loss of consciousness   Nsaids Anaphylaxis and Rash    Able to tolerate aspirin     Trulicity  [Dulaglutide ] Nausea And Vomiting    Medication list has been reviewed and updated.  Current Outpatient Medications on File Prior to Visit  Medication Sig Dispense Refill   Accu-Chek Softclix Lancets lancets Use to test blood sugars up to 4 times daily as needed. 100 each 0   acetaminophen -codeine  (TYLENOL  #3) 300-30 MG tablet Take 1 tablet by mouth every 6 (six) hours as needed for moderate pain. 30 tablet 0   amitriptyline  (ELAVIL ) 10 MG tablet Take 1 tablet (10 mg total) by  mouth at bedtime. 30 tablet 0   apixaban  (ELIQUIS ) 5 MG TABS tablet Take 1 tablet (5 mg total) by mouth 2 (two) times daily. 60 tablet 0   Ascorbic Acid  (VITAMIN C) 1000 MG tablet Take 1,000 mg by mouth daily.     blood glucose meter kit and supplies Dispense based on patient and insurance preference. Use up to four times daily as directed. (FOR ICD-10 E10.9, E11.9). 1 each 0   Blood Glucose Monitoring Suppl (BLOOD GLUCOSE MONITOR SYSTEM) w/Device KIT Use to test blood sugar up to 4 times daily as needed. 1 kit 0   carvedilol  (COREG ) 6.25 MG tablet TAKE 1 TABLET BY MOUTH TWICE DAILY WITH A MEAL 180 tablet 0   Cholecalciferol (VITAMIN D-3 PO) Take 5,000 Units by mouth daily.     empagliflozin  (JARDIANCE ) 10 MG TABS tablet Take 1 tablet (10 mg total) by mouth daily before breakfast. 90 tablet 3   gabapentin  (NEURONTIN ) 100 MG capsule Take 1 capsule (100 mg total) by mouth every evening. 30 capsule 0   glipiZIDE  (GLUCOTROL ) 5 MG tablet Take 1 tablet (5 mg total) by mouth 2 (two) times daily before a meal. 180 tablet 3   glucose blood (ACCU-CHEK GUIDE) test strip Use to test blood sugars up to 4 times daily as needed. 100 each 0   Insulin  Glargine (BASAGLAR  KWIKPEN) 100 UNIT/ML Inject 36 Units into the skin daily. 45 mL 3   Insulin  Pen Needle (BD PEN NEEDLE MICRO ULTRAFINE) 32G X 6 MM MISC 1 Device by Other route daily in the afternoon. 100 each 3   isosorbide  mononitrate (IMDUR ) 30 MG 24 hr tablet Take 1 tablet (30 mg total) by mouth at bedtime. 30 tablet 1   loperamide  (IMODIUM ) 2 MG capsule Take 1 capsule (2 mg total) by mouth as needed for diarrhea or loose stools. 30 capsule 0   methocarbamol  (ROBAXIN ) 500 MG tablet TAKE 1 TABLET BY MOUTH EVERY 6 HOURS AS NEEDED FOR MUSCLE SPASM 30 tablet 1   nitroGLYCERIN  (NITROSTAT ) 0.4 MG SL tablet Place 1 tablet (0.4 mg total) under the tongue every 5 (five) minutes as needed for chest pain. 30 tablet 0   pantoprazole  (PROTONIX ) 40 MG tablet Take 1 tablet (40  mg total) by mouth daily as needed (acid reflux). 30 tablet 6   potassium chloride  SA (KLOR-CON  M) 20 MEQ tablet TAKE 1 TABLET BY MOUTH EVERY OTHER DAY 45 tablet 7   rosuvastatin  (CRESTOR ) 40 MG tablet Take 1 tablet by mouth once daily 30 tablet 0   sacubitril -valsartan  (ENTRESTO ) 49-51 MG Take 1 tablet by mouth 2 (two) times daily. Needs appt 60 tablet 0   spironolactone  (ALDACTONE ) 25 MG tablet Take 1 tablet by mouth once daily 90 tablet 0   tadalafil  (CIALIS ) 20 MG tablet Take 1 tablet (20 mg total) by mouth daily as needed for erectile dysfunction. 10 tablet 11   tamsulosin  (FLOMAX ) 0.4 MG CAPS capsule Take 1 capsule (0.4 mg total) by mouth daily after supper. 30 capsule 11   torsemide  (DEMADEX ) 20 MG tablet Take 2 tablets (40 mg total) by mouth daily.     No current facility-administered medications on file prior to visit.    Review of Systems:  As per HPI- otherwise negative.   Physical Examination: There were no vitals filed for this visit. There were no vitals filed for this visit. There is no height or weight on file to calculate BMI. Ideal Body Weight:    GEN: no acute distress. HEENT:  Atraumatic, Normocephalic.  Ears and Nose: No external deformity. CV: RRR, No M/G/R. No JVD. No thrill. No extra heart sounds. PULM: CTA B, no wheezes, crackles, rhonchi. No retractions. No resp. distress. No accessory muscle use. ABD: S, NT, ND, +BS. No rebound. No HSM. EXTR: No c/c/e PSYCH: Normally interactive. Conversant.    Assessment and Plan: No diagnosis found.  Assessment & Plan   Signed Harlene Schroeder, MD

## 2024-04-21 ENCOUNTER — Ambulatory Visit: Payer: Self-pay | Admitting: Family Medicine

## 2024-04-21 NOTE — Progress Notes (Signed)
Remote ICD Transmission.

## 2024-04-26 NOTE — Progress Notes (Addendum)
 [facility], [employer] [facility] [employer] [address], [address] [place], [place] [address] [phone]  Date:  04/30/2024   Name:  Mitchell [name]   DOB:  Jul 03, 1971   MRN:  [id number]  PCP:  Watt Harlene BROCKS, MD    Chief Complaint: Follow-up (Nobody told me the side effects of all of these medications )   History of Present Illness:  Mitchell [name] is a 53 y.o. very pleasant male patient who presents with the following:  Patient seen today for follow-up.  He was a no-show last week-I last saw him about 1 year ago History of CVA, schizophrenia versus bipolar disorder, OSA, obesity, CAD, HTN, hyperlipidemia, DM with history of right BKA, CRI   He was seen by endocrinology on 9/15, Dr. Sam.  She noted his previous visit was over a year ago MEDICATIONS: Take glipizide  5 mg twice daily Restart Jardiance  10 mg daily Basaglar  36 units daily   He saw cardiology in August-the note he was recently diagnosed with atrial fibrillation earlier this year, detected by his ICD Chronic Systolic Dysfunction ICM / NICM s/p Medtronic single chamber ICD  -euvolemic by exam / device   -Stable on an appropriate medical regimen -Normal ICD function -See Pace Art report -No changes today New Onset AF  Identified 03/18/24 on device, episodes lasted 54 minutes. CHA2DS2-VASc 6 -pt not on OAC, has hx of CVA -begin Eliquis  5mg  BID given prior CVA hx, appropriate by age / wt -reviewed with Dr. Margaretann, stop plavix  & ASA.  Pt was actually not taking Plavix  (was not in his bag of meds) -0.1% burden on device  3V CAD, ICM / NICM  No targets for intervention  -no anginal symptoms  -follows with Dr. Cherrie Hypertension  -well controlled on current regimen    Flu vaccine- give today  Recommend COVID booster Eye exam- he notes done recently per [facility] works at [employer] - will request report  He has nephrology care but I am not sure when he was seen most  recently. Encouraged him to respond to their recent message and schedule an appt   Cologuard is due this fall- will go ahead and order for him   Lab Results  Component Value Date   HGBA1C 10.9 (A) 04/14/2024     Discussed the use of AI scribe software for clinical note transcription with the patient, who gave verbal consent to proceed.  History of Present Illness Mitchell [name] is a 53 year old male who presents with concerns about medication side effects and diarrhea.  He feels overwhelmed by his current medication regimen and is concerned about potential side effects, particularly regarding his 'manhood' and diarrhea. He is taking multiple medications and is unsure about his side effects, which he believes might be contributing to his symptoms.  He has been experiencing diarrhea, which he associates with his medication use. When he stopped taking his medications for a few days, his bowel movements returned to normal. He is uncertain which medication might be causing the diarrhea, as he is on multiple medications simultaneously.  He is currently taking amitriptyline  at bedtime and gabapentin , although not every night. He is also on Crestor  (rosuvastatin ) 40 mg for cholesterol management. He has been prescribed Jardiance  for diabetes but has not started it due to its high cost.  He recently had his eyes checked and received new glasses, with his prescription remaining unchanged. He has had surgery on one  eye previously. He reports difficulty with mobility, noting fatigue and difficulty walking due to leg issues. He also mentioned that changes in wheelchair availability at his medical facility have made it harder for him to get around.  No recent visits to the kidney doctor. He saw the heart doctor in August and the endocrinologist for diabetes management a few weeks ago.   Patient Active Problem List   Diagnosis Date Noted   Chest pain 09/23/2023   Diarrhea 09/23/2023   History of  right below knee amputation (HCC) 03/16/2023   Diabetes mellitus (HCC) 03/16/2023   BPH with obstruction/lower urinary tract symptoms 11/21/2022   UTI (urinary tract infection) 10/23/2022   ICD (implantable cardioverter-defibrillator) in place 04/19/2022   Near syncope 12/08/2021   CKD (chronic kidney disease) stage 3, GFR 30-59 ml/min (HCC) 12/08/2021   PVD s/p R LE amputation  12/08/2021   Diabetic retinopathy associated with type 2 diabetes mellitus (HCC) 06/06/2021   CAD (coronary artery disease) with flat troponin  04/11/2021   S/P below knee amputation, right (HCC) 02/07/2021   AKI (acute kidney injury) 01/23/2021   QT prolongation 07/30/2019   Acute on chronic combined systolic and diastolic CHF (congestive heart failure) (HCC) 07/29/2019   Stroke (HCC) 05/08/2017   Uncontrolled type 2 diabetes mellitus with hyperglycemia, with long-term current use of insulin  (HCC) 12/07/2015   Chronic systolic heart failure (HCC) 10/02/2011   Organic impotence 10/02/2011   Obstructive sleep apnea 10/11/2007   HLD (hyperlipidemia) 10/10/2007   HYPOKALEMIA 10/10/2007   Obesity, Class III, BMI 40-49.9 (morbid obesity) (HCC) 10/10/2007   Essential hypertension 10/10/2007   PREMATURE VENTRICULAR CONTRACTIONS 10/10/2007   History of colonic polyps 10/10/2007    Past Medical History:  Diagnosis Date   Bipolar disorder (HCC)    CAD (coronary artery disease)    a. diffuse 3v CAD by cath 2019, medical therapy recommended.   Cataract    forming    Chronic systolic CHF (congestive heart failure) (HCC)    CVA (cerebral infarction)    No residual deficits   Depression    PTSD,    Diabetes mellitus    TYPE II; UNCONTROLLED BY HEMOGLOBIN A1c; STABLE AS  PER DISCHARGE   Headache(784.0)    Herpes simplex of male genitalia    History of colonic polyps    Hyperlipidemia    Hypertension    Myocardial infarction (HCC) 1987   (while playing football)   Obesity    OSA (obstructive sleep apnea)     repeat study 2018 without significant OSA   Pneumonia    Post-cardiac injury syndrome (HCC)    History of cardiac injury from blunt trauma   Pulmonary hypertension (HCC)    a. moderately elevated PASP 07/2019.   PVCs (premature ventricular contractions)    Schizophrenia (HCC)    [facility]   Sleep apnea    Stroke [facility]) 2005   some left side weakness   Syncope    Recurrent, thought to be vasovagal. Also has h/o frequent PVCs.     Past Surgical History:  Procedure Laterality Date   AMPUTATION Right 01/25/2021   Procedure: RIGHT BELOW KNEE AMPUTATION;  Surgeon: Harden Jerona GAILS, MD;  Location: [facility] OR;  Service: Orthopedics;  Laterality: Right;   CARDIAC CATHETERIZATION  12/19/2010   DIFFUSE NONOBSTRUCTIVE CAD; NONISCHEMIC CARDIOMYOPATHY; LEFT VENTRICULAR ANGIOGRAM WAS PERFORMED SECONDARY TO  ELEVATED LEFT VENTRICULAR FILLING PRESSURES   COLONOSCOPY     ~ age [age]   COLONOSCOPY W/ POLYPECTOMY  I & D EXTREMITY Bilateral 10/24/2019   Procedure: IRRIGATION AND DEBRIDEMENT BILATERAL EXTREMITY WOUND ON FOOT;  Surgeon: Elisabeth Craig RAMAN, MD;  Location: [facility] OR;  Service: Plastics;  Laterality: Bilateral;   ICD IMPLANT N/A 01/13/2022   Procedure: ICD IMPLANT;  Surgeon: Waddell Danelle ORN, MD;  Location[facility] INVASIVE CV LAB;  Service: Cardiovascular;  Laterality: N/A;   METATARSAL HEAD EXCISION Right 08/29/2018   Procedure: METATARSAL HEAD RESECTION;  Surgeon: Janit Thresa HERO, DPM;  Location: [facility] OR;  Service: Podiatry;  Laterality: Right;   METATARSAL OSTEOTOMY Right 08/29/2018   Procedure: SUB FIFTHE METATARSIA RIGHT FOOT;  Surgeon: Janit Thresa HERO, DPM;  Location: [facility] OR;  Service: Podiatry;  Laterality: Right;   MULTIPLE EXTRACTIONS WITH ALVEOLOPLASTY  01/27/2014   all my teeth; 4 Quadrants of alveoloplasty   MULTIPLE EXTRACTIONS WITH ALVEOLOPLASTY N/A 01/27/2014   Procedure: EXTRACTION OF TEETH #'1, 2, 3, 4, 5, 6, 7, 8, 9, 10, 11, 12, 13, 14, 15, 16, 17, 20, 21, 22, 23,  24, 25, 26, 27, 28, 29, 31 and 32 WITH ALVEOLOPLASTY;  Surgeon: Tanda JULIANNA Fanny, DDS;  Location: [facility] OR;  Service: Oral Surgery;  Laterality: N/A;   ORIF FINGER / THUMB FRACTURE Right    POLYPECTOMY     RIGHT HEART CATH N/A 01/27/2021   Procedure: RIGHT HEART CATH;  Surgeon: Cherrie Toribio SAUNDERS, MD;  Location: [facility] INVASIVE CV LAB;  Service: Cardiovascular;  Laterality: N/A;   RIGHT/LEFT HEART CATH AND CORONARY ANGIOGRAPHY N/A 09/26/2017   Procedure: RIGHT/LEFT HEART CATH AND CORONARY ANGIOGRAPHY;  Surgeon: Cherrie Toribio SAUNDERS, MD;  Location: [facility] INVASIVE CV LAB;  Service: Cardiovascular;  Laterality: N/A;   RIGHT/LEFT HEART CATH AND CORONARY ANGIOGRAPHY N/A 04/12/2021   Procedure: RIGHT/LEFT HEART CATH AND CORONARY ANGIOGRAPHY;  Surgeon: Cherrie Toribio SAUNDERS, MD;  Location: [facility] INVASIVE CV LAB;  Service: Cardiovascular;  Laterality: N/A;   SKIN SPLIT GRAFT Bilateral 10/24/2019   Procedure: SKIN GRAFT SPLIT THICKNESS LEFT THIGH;  Surgeon: Elisabeth Craig RAMAN, MD;  Location: [facility] OR;  Service: Plastics;  Laterality: Bilateral;   WOUND DEBRIDEMENT Right 08/29/2018   Procedure: Debridement of ulcer on right fifth metatarsal;  Surgeon: Janit Thresa HERO, DPM;  Location: [facility] OR;  Service: Podiatry;  Laterality: Right;    Social History   Tobacco Use   Smoking status: Never   Smokeless tobacco: Never  Vaping Use   Vaping status: Never Used  Substance Use Topics   Alcohol use: No   Drug use: No    Family History  Problem Relation Age of Onset   Heart disease Mother        MI   Heart failure Mother    Diabetes Mother        ALSO IN MOST OF HIS SIBLINGS; 2 UNLCES HAVE ALSO PASSED AWAY FROM DM   Cardiomyopathy Mother    Cancer - Ovarian Mother    Ovarian cancer Mother    Heart disease Father    Hypertension Father    Diabetes Father    Diabetes Sister    Diabetes Brother    Colon cancer Paternal Uncle    Colon cancer Paternal Uncle    Colon polyps Neg Hx    Esophageal cancer Neg Hx    Rectal  cancer Neg Hx    Stomach cancer Neg Hx     Allergies  Allergen Reactions   Farxiga  [Dapagliflozin ] Other (See Comments)    Patient reports loss of consciousness   Nsaids Anaphylaxis and Rash  Able to tolerate aspirin     Trulicity  [Dulaglutide ] Nausea And Vomiting    Medication list has been reviewed and updated.  Current Outpatient Medications on File Prior to Visit  Medication Sig Dispense Refill   Accu-Chek Softclix Lancets lancets Use to test blood sugars up to 4 times daily as needed. 100 each 0   acetaminophen -codeine  (TYLENOL  #3) 300-30 MG tablet Take 1 tablet by mouth every 6 (six) hours as needed for moderate pain. 30 tablet 0   apixaban  (ELIQUIS ) 5 MG TABS tablet Take 1 tablet (5 mg total) by mouth 2 (two) times daily. 60 tablet 0   Ascorbic Acid  (VITAMIN C) 1000 MG tablet Take 1,000 mg by mouth daily.     blood glucose meter kit and supplies Dispense based on patient and insurance preference. Use up to four times daily as directed. (FOR ICD-10 E10.9, E11.9). 1 each 0   Blood Glucose Monitoring Suppl (BLOOD GLUCOSE MONITOR SYSTEM) w/Device KIT Use to test blood sugar up to 4 times daily as needed. 1 kit 0   carvedilol  (COREG ) 6.25 MG tablet TAKE 1 TABLET BY MOUTH TWICE DAILY WITH A MEAL 180 tablet 0   Cholecalciferol (VITAMIN D-3 PO) Take 5,000 Units by mouth daily.     empagliflozin  (JARDIANCE ) 10 MG TABS tablet Take 1 tablet (10 mg total) by mouth daily before breakfast. 90 tablet 3   glipiZIDE  (GLUCOTROL ) 5 MG tablet Take 1 tablet (5 mg total) by mouth 2 (two) times daily before a meal. 180 tablet 3   glucose blood (ACCU-CHEK GUIDE) test strip Use to test blood sugars up to 4 times daily as needed. 100 each 0   Insulin  Glargine (BASAGLAR  KWIKPEN) 100 UNIT/ML Inject 36 Units into the skin daily. 45 mL 3   Insulin  Pen Needle (BD PEN NEEDLE MICRO ULTRAFINE) 32G X 6 MM MISC 1 Device by Other route daily in the afternoon. 100 each 3   isosorbide  mononitrate (IMDUR ) 30 MG 24 hr  tablet Take 1 tablet (30 mg total) by mouth at bedtime. 30 tablet 1   loperamide  (IMODIUM ) 2 MG capsule Take 1 capsule (2 mg total) by mouth as needed for diarrhea or loose stools. 30 capsule 0   nitroGLYCERIN  (NITROSTAT ) 0.4 MG SL tablet Place 1 tablet (0.4 mg total) under the tongue every 5 (five) minutes as needed for chest pain. 30 tablet 0   pantoprazole  (PROTONIX ) 40 MG tablet Take 1 tablet (40 mg total) by mouth daily as needed (acid reflux). 30 tablet 6   potassium chloride  SA (KLOR-CON  M) 20 MEQ tablet TAKE 1 TABLET BY MOUTH EVERY OTHER DAY 45 tablet 7   rosuvastatin  (CRESTOR ) 40 MG tablet Take 1 tablet by mouth once daily 30 tablet 0   sacubitril -valsartan  (ENTRESTO ) 49-51 MG Take 1 tablet by mouth 2 (two) times daily. Needs appt 60 tablet 0   spironolactone  (ALDACTONE ) 25 MG tablet Take 1 tablet by mouth once daily 90 tablet 0   tadalafil  (CIALIS ) 20 MG tablet Take 1 tablet (20 mg total) by mouth daily as needed for erectile dysfunction. (Patient not taking: Reported on 04/30/2024) 10 tablet 11   tamsulosin  (FLOMAX ) 0.4 MG CAPS capsule Take 1 capsule (0.4 mg total) by mouth daily after supper. 30 capsule 11   torsemide  (DEMADEX ) 20 MG tablet Take 2 tablets (40 mg total) by mouth daily.     No current facility-administered medications on file prior to visit.    Review of Systems:  As per HPI- otherwise negative.   Physical  Examination: Vitals:   04/30/24 0818  BP: 114/68  Pulse: 91  SpO2: 97%   Vitals:   04/30/24 0818  Height: 5' 11.5 (1.816 m)   Body mass index is 39.2 kg/m. Ideal Body Weight: Weight in (lb) to have BMI = 25: 181.4  GEN: no acute distress.  Obese, looks well  HEENT: Atraumatic, Normocephalic.  Ears and Nose: No external deformity. CV: RRR, No M/G/R. No JVD. No thrill. No extra heart sounds. PULM: CTA B, no wheezes, crackles, rhonchi. No retractions. No resp. distress. No accessory muscle use. ABD: S, NT, ND PSYCH: Normally interactive. Conversant.   Right BKA, in WC today   Assessment and Plan: Type 2 diabetes mellitus with other specified complication, with long-term current use of insulin  (HCC) - Plan: Comprehensive metabolic panel with GFR  Insomnia, unspecified type - Plan: amitriptyline  (ELAVIL ) 10 MG tablet  Acute bilateral low back pain without sciatica - Plan: gabapentin  (NEURONTIN ) 100 MG capsule  Hx of right BKA (HCC)  Mixed hyperlipidemia - Plan: Lipid panel  Screening for deficiency anemia - Plan: CBC  Special screening, prostate cancer - Plan: PSA  Screening for colon cancer - Plan: Cologuard  Assessment & Plan Diarrhea, possible medication side effect Diarrhea possibly linked to medication side effects, with no specific medication identified. Symptoms occur with concurrent medication use. - Review medication list for potential side effects. - Educated on potential side effects of medications.  Type 2 diabetes mellitus, on insulin  Type 2 diabetes mellitus managed with insulin . Issue with affordability of Jardiance , not communicated to endocrinologist. - Encouraged contacting endocrinologist regarding affordability of Jardiance .  Benign prostatic hyperplasia with lower urinary tract symptoms and erectile dysfunction Upcoming urology appointment for further evaluation and management. Concern about medication side effects affecting erectile function.will check his PSA today   Dyslipidemia Dyslipidemia managed with rosuvastatin . Cholesterol levels not checked recently. - Order cholesterol lab test.    Signed Harlene Schroeder, MD  Received labs 10/2- letter to pt as he does not have mychart   Results for orders placed or performed in visit on 04/30/24  CBC   Collection Time: 04/30/24  8:46 AM  Result Value Ref Range   WBC 11.9 (H) 4.0 - 10.5 K/uL   RBC 4.52 4.22 - 5.81 Mil/uL   Platelets 115.0 (L) 150.0 - 400.0 K/uL   Hemoglobin 13.4 13.0 - 17.0 g/dL   HCT 59.1 60.9 - 47.9 %   MCV 90.4 78.0 - 100.0 fl    MCHC 32.8 30.0 - 36.0 g/dL   RDW 86.3 88.4 - 84.4 %  Comprehensive metabolic panel with GFR   Collection Time: 04/30/24  8:46 AM  Result Value Ref Range   Sodium 141 135 - 145 mEq/L   Potassium 3.8 3.5 - 5.1 mEq/L   Chloride 105 96 - 112 mEq/L   CO2 28 19 - 32 mEq/L   Glucose, Bld 182 (H) 70 - 99 mg/dL   BUN 25 (H) 6 - 23 mg/dL   Creatinine, Ser 8.59 0.40 - 1.50 mg/dL   Total Bilirubin 0.6 0.2 - 1.2 mg/dL   Alkaline Phosphatase 70 39 - 117 U/L   AST 15 0 - 37 U/L   ALT 9 0 - 53 U/L   Total Protein 6.8 6.0 - 8.3 g/dL   Albumin  3.9 3.5 - 5.2 g/dL   GFR 43.49 (L) >39.99 mL/min   Calcium  9.0 8.4 - 10.5 mg/dL  Lipid panel   Collection Time: 04/30/24  8:46 AM  Result Value Ref Range  Cholesterol 201 (H) 0 - 200 mg/dL   Triglycerides 857.9 0.0 - 149.0 mg/dL   HDL 67.29 (L) >60.99 mg/dL   VLDL 71.5 0.0 - 59.9 mg/dL   LDL Cholesterol 859 (H) 0 - 99 mg/dL   Total CHOL/HDL Ratio 6    NonHDL 168.02   PSA   Collection Time: 04/30/24  8:46 AM  Result Value Ref Range   PSA 0.65 0.10 - 4.00 ng/mL   *Note: Due to a large number of results and/or encounters for the requested time period, some results have not been displayed. A complete set of results can be found in Results Review.    "

## 2024-04-30 ENCOUNTER — Ambulatory Visit: Admitting: Family Medicine

## 2024-04-30 ENCOUNTER — Encounter: Payer: Self-pay | Admitting: Family Medicine

## 2024-04-30 VITALS — BP 114/68 | HR 91 | Ht 71.5 in

## 2024-04-30 DIAGNOSIS — Z130 Encounter for screening for diseases of the blood and blood-forming organs and certain disorders involving the immune mechanism: Secondary | ICD-10-CM

## 2024-04-30 DIAGNOSIS — Z125 Encounter for screening for malignant neoplasm of prostate: Secondary | ICD-10-CM

## 2024-04-30 DIAGNOSIS — Z1211 Encounter for screening for malignant neoplasm of colon: Secondary | ICD-10-CM

## 2024-04-30 DIAGNOSIS — Z794 Long term (current) use of insulin: Secondary | ICD-10-CM | POA: Diagnosis not present

## 2024-04-30 DIAGNOSIS — E782 Mixed hyperlipidemia: Secondary | ICD-10-CM

## 2024-04-30 DIAGNOSIS — Z23 Encounter for immunization: Secondary | ICD-10-CM

## 2024-04-30 DIAGNOSIS — E1169 Type 2 diabetes mellitus with other specified complication: Secondary | ICD-10-CM

## 2024-04-30 DIAGNOSIS — D72829 Elevated white blood cell count, unspecified: Secondary | ICD-10-CM

## 2024-04-30 DIAGNOSIS — Z89511 Acquired absence of right leg below knee: Secondary | ICD-10-CM | POA: Diagnosis not present

## 2024-04-30 DIAGNOSIS — M545 Low back pain, unspecified: Secondary | ICD-10-CM

## 2024-04-30 DIAGNOSIS — G4700 Insomnia, unspecified: Secondary | ICD-10-CM | POA: Diagnosis not present

## 2024-04-30 LAB — LIPID PANEL
Cholesterol: 201 mg/dL — ABNORMAL HIGH (ref 0–200)
HDL: 32.7 mg/dL — ABNORMAL LOW (ref >39.00)
LDL Cholesterol: 140 mg/dL — ABNORMAL HIGH (ref 0–99)
NonHDL: 168.02
Total CHOL/HDL Ratio: 6
Triglycerides: 142 mg/dL (ref 0.0–149.0)
VLDL: 28.4 mg/dL (ref 0.0–40.0)

## 2024-04-30 LAB — COMPREHENSIVE METABOLIC PANEL WITH GFR
ALT: 9 U/L (ref 0–53)
AST: 15 U/L (ref 0–37)
Albumin: 3.9 g/dL (ref 3.5–5.2)
Alkaline Phosphatase: 70 U/L (ref 39–117)
BUN: 25 mg/dL — ABNORMAL HIGH (ref 6–23)
CO2: 28 meq/L (ref 19–32)
Calcium: 9 mg/dL (ref 8.4–10.5)
Chloride: 105 meq/L (ref 96–112)
Creatinine, Ser: 1.4 mg/dL (ref 0.40–1.50)
GFR: 56.5 mL/min — ABNORMAL LOW (ref >60.00)
Glucose, Bld: 182 mg/dL — ABNORMAL HIGH (ref 70–99)
Potassium: 3.8 meq/L (ref 3.5–5.1)
Sodium: 141 meq/L (ref 135–145)
Total Bilirubin: 0.6 mg/dL (ref 0.2–1.2)
Total Protein: 6.8 g/dL (ref 6.0–8.3)

## 2024-04-30 LAB — CBC
HCT: 40.8 % (ref 39.0–52.0)
Hemoglobin: 13.4 g/dL (ref 13.0–17.0)
MCHC: 32.8 g/dL (ref 30.0–36.0)
MCV: 90.4 fl (ref 78.0–100.0)
Platelets: 115 K/uL — ABNORMAL LOW (ref 150.0–400.0)
RBC: 4.52 Mil/uL (ref 4.22–5.81)
RDW: 13.6 % (ref 11.5–15.5)
WBC: 11.9 K/uL — ABNORMAL HIGH (ref 4.0–10.5)

## 2024-04-30 LAB — PSA: PSA: 0.65 ng/mL (ref 0.10–4.00)

## 2024-04-30 MED ORDER — AMITRIPTYLINE HCL 10 MG PO TABS: 10.0000 mg | ORAL_TABLET | Freq: Every day | ORAL | 1 refills | Status: AC

## 2024-04-30 MED ORDER — GABAPENTIN 100 MG PO CAPS: 100.0000 mg | ORAL_CAPSULE | Freq: Every evening | ORAL | 1 refills | Status: AC

## 2024-04-30 NOTE — Patient Instructions (Signed)
 Good to see you today I will be in touch with your labs asap I refilled your amitriptyline  and gabapentin  so you can continue to take them  Flu shot today Recommend that you get a covid booster at your pharmacy Please see me in about 6 months

## 2024-05-01 MED ORDER — ROSUVASTATIN CALCIUM 40 MG PO TABS: 40.0000 mg | ORAL_TABLET | Freq: Every day | ORAL | 0 refills | Status: AC

## 2024-05-01 NOTE — Addendum Note (Signed)
 Addended by: WATT RAISIN C on: 05/01/2024 07:08 AM   Modules accepted: Orders

## 2024-05-04 NOTE — Progress Notes (Deleted)
 Chief Complaint: Erectile problems  History of Present Illness:  Mitchell [name] is a 53 y.o. male who is seen in consultation from [name], Harlene BROCKS, MD for evaluation of ED. He has multiple medical issues--OSA, h/o CVA, HTN, CAD on Imdur , obesity, DM w/ vascular dz, schizoaffective d/o.SABRA   Past Medical History:  Past Medical History:  Diagnosis Date   Bipolar disorder (HCC)    CAD (coronary artery disease)    a. diffuse 3v CAD by cath 2019, medical therapy recommended.   Cataract    forming    Chronic systolic CHF (congestive heart failure) (HCC)    CVA (cerebral infarction)    No residual deficits   Depression    PTSD,    Diabetes mellitus    TYPE II; UNCONTROLLED BY HEMOGLOBIN A1c; STABLE AS  PER DISCHARGE   Headache(784.0)    Herpes simplex of male genitalia    History of colonic polyps    Hyperlipidemia    Hypertension    Myocardial infarction (HCC) 1987   (while playing football)   Obesity    OSA (obstructive sleep apnea)    repeat study 2018 without significant OSA   Pneumonia    Post-cardiac injury syndrome (HCC)    History of cardiac injury from blunt trauma   Pulmonary hypertension (HCC)    a. moderately elevated PASP 07/2019.   PVCs (premature ventricular contractions)    Schizophrenia (HCC)    Goes to [facility]   Sleep apnea    Stroke [facility]) 2005   some left side weakness   Syncope    Recurrent, thought to be vasovagal. Also has h/o frequent PVCs.     Past Surgical History:  Past Surgical History:  Procedure Laterality Date   AMPUTATION Right 01/25/2021   Procedure: RIGHT BELOW KNEE AMPUTATION;  Surgeon: Harden Jerona GAILS, MD;  Location: [facility] OR;  Service: Orthopedics;  Laterality: Right;   CARDIAC CATHETERIZATION  12/19/2010   DIFFUSE NONOBSTRUCTIVE CAD; NONISCHEMIC CARDIOMYOPATHY; LEFT VENTRICULAR ANGIOGRAM WAS PERFORMED SECONDARY TO  ELEVATED LEFT VENTRICULAR FILLING PRESSURES   COLONOSCOPY     ~ age 49-23   COLONOSCOPY W/  POLYPECTOMY     I & D EXTREMITY Bilateral 10/24/2019   Procedure: IRRIGATION AND DEBRIDEMENT BILATERAL EXTREMITY WOUND ON FOOT;  Surgeon: Elisabeth Craig RAMAN, MD;  Location: [facility] OR;  Service: Plastics;  Laterality: Bilateral;   ICD IMPLANT N/A 01/13/2022   Procedure: ICD IMPLANT;  Surgeon: Waddell Danelle ORN, MD;  Location: [facility] INVASIVE CV LAB;  Service: Cardiovascular;  Laterality: N/A;   METATARSAL HEAD EXCISION Right 08/29/2018   Procedure: METATARSAL HEAD RESECTION;  Surgeon: Janit Thresa HERO, DPM;  Location: [facility] OR;  Service: Podiatry;  Laterality: Right;   METATARSAL OSTEOTOMY Right 08/29/2018   Procedure: SUB FIFTHE METATARSIA RIGHT FOOT;  Surgeon: Janit Thresa HERO, DPM;  Location: [facility] OR;  Service: Podiatry;  Laterality: Right;   MULTIPLE EXTRACTIONS WITH ALVEOLOPLASTY  01/27/2014   all my teeth; 4 Quadrants of alveoloplasty   MULTIPLE EXTRACTIONS WITH ALVEOLOPLASTY N/A 01/27/2014   Procedure: EXTRACTION OF TEETH #'1, 2, 3, 4, 5, 6, 7, 8, 9, 10, 11, 12, 13, 14, 15, 16, 17, 20, 21, 22, 23, 24, 25, 26, 27, 28, 29, 31 and 32 WITH ALVEOLOPLASTY;  Surgeon: Tanda JULIANNA Fanny, DDS;  Location: [facility] OR;  Service: Oral Surgery;  Laterality: N/A;   ORIF FINGER / THUMB FRACTURE Right    POLYPECTOMY     RIGHT HEART CATH N/A 01/27/2021   Procedure:  RIGHT HEART CATH;  Surgeon: Cherrie Toribio SAUNDERS, MD;  Location: [facility] INVASIVE CV LAB;  Service: Cardiovascular;  Laterality: N/A;   RIGHT/LEFT HEART CATH AND CORONARY ANGIOGRAPHY N/A 09/26/2017   Procedure: RIGHT/LEFT HEART CATH AND CORONARY ANGIOGRAPHY;  Surgeon: Cherrie Toribio SAUNDERS, MD;  Location: [facility] INVASIVE CV LAB;  Service: Cardiovascular;  Laterality: N/A;   RIGHT/LEFT HEART CATH AND CORONARY ANGIOGRAPHY N/A 04/12/2021   Procedure: RIGHT/LEFT HEART CATH AND CORONARY ANGIOGRAPHY;  Surgeon: Cherrie Toribio SAUNDERS, MD;  Location: [facility] INVASIVE CV LAB;  Service: Cardiovascular;  Laterality: N/A;   SKIN SPLIT GRAFT Bilateral 10/24/2019   Procedure: SKIN GRAFT SPLIT THICKNESS  LEFT THIGH;  Surgeon: Elisabeth Craig RAMAN, MD;  Location: [facility] OR;  Service: Plastics;  Laterality: Bilateral;   WOUND DEBRIDEMENT Right 08/29/2018   Procedure: Debridement of ulcer on right fifth metatarsal;  Surgeon: Janit Thresa HERO, DPM;  Location: [facility] OR;  Service: Podiatry;  Laterality: Right;    Allergies:  Allergies  Allergen Reactions   Farxiga  [Dapagliflozin ] Other (See Comments)    Patient reports loss of consciousness   Nsaids Anaphylaxis and Rash    Able to tolerate aspirin     Trulicity  [Dulaglutide ] Nausea And Vomiting    Family History:  Family History  Problem Relation Age of Onset   Heart disease Mother        MI   Heart failure Mother    Diabetes Mother        ALSO IN MOST OF HIS SIBLINGS; 2 UNLCES HAVE ALSO PASSED AWAY FROM DM   Cardiomyopathy Mother    Cancer - Ovarian Mother    Ovarian cancer Mother    Heart disease Father    Hypertension Father    Diabetes Father    Diabetes Sister    Diabetes Brother    Colon cancer Paternal Uncle    Colon cancer Paternal Uncle    Colon polyps Neg Hx    Esophageal cancer Neg Hx    Rectal cancer Neg Hx    Stomach cancer Neg Hx     Social History:  Social History   Tobacco Use   Smoking status: Never   Smokeless tobacco: Never  Vaping Use   Vaping status: Never Used  Substance Use Topics   Alcohol use: No   Drug use: No    Review of symptoms:  Constitutional:  Negative for unexplained weight loss, night sweats, fever, chills ENT:  Negative for nose bleeds, sinus pain, painful swallowing CV:  Negative for chest pain, shortness of breath, exercise intolerance, palpitations, loss of consciousness Resp:  Negative for cough, wheezing, shortness of breath GI:  Negative for nausea, vomiting, diarrhea, bloody stools GU:  Positives noted in HPI; otherwise negative for gross hematuria, dysuria, urinary incontinence Neuro:  Negative for seizures, poor balance, limb weakness, slurred speech Psych:  Negative for lack of  energy, depression, anxiety Endocrine:  Negative for polydipsia, polyuria, symptoms of hypoglycemia (dizziness, hunger, sweating) Hematologic:  Negative for anemia, purpura, petechia, prolonged or excessive bleeding, use of anticoagulants  Allergic:  Negative for difficulty breathing or choking as a result of exposure to anything; no shellfish allergy; no allergic response (rash/itch) to materials, foods  Physical exam: There were no vitals taken for this visit. GENERAL APPEARANCE:  Well appearing, well developed, well nourished, NAD HEENT: Atraumatic, Normocephalic. NECK: Normal appearance LUNGS: Normal inspiratory and expiratory excursion HEART: Regular Rate ABDOMEN: ***. GU: Phallus normal, no lesions. Scrotal skin normal. Testicles/epididymal structures normal. Meatus normal. Normal anal sphincter tone, prostate ***mL, symmetric,  non nodular, non tender. EXTREMITIES: Moves all extremities well.  Without clubbing, cyanosis, or edema. NEUROLOGIC:  Alert and oriented x 3, normal gait, CN II-XII grossly intact.  MENTAL STATUS:  Appropriate. SKIN:  Warm, dry and intact.    Results: No results found. However, due to the size of the patient record, not all encounters were searched. Please check Results Review for a complete set of results.  I have reviewed referring/prior physicians notes  I have reviewed urinalysis  I have reviewed PSA results  I have reviewed prior imaging  I have reviewed urine culture results  Assessment: ***   Plan: ***

## 2024-05-05 ENCOUNTER — Ambulatory Visit (INDEPENDENT_AMBULATORY_CARE_PROVIDER_SITE_OTHER): Admitting: Urology

## 2024-05-05 ENCOUNTER — Ambulatory Visit: Admitting: Urology

## 2024-05-05 ENCOUNTER — Other Ambulatory Visit: Payer: Self-pay | Admitting: Urology

## 2024-05-05 ENCOUNTER — Encounter: Payer: Self-pay | Admitting: Urology

## 2024-05-05 VITALS — BP 138/95 | HR 66

## 2024-05-05 DIAGNOSIS — N401 Enlarged prostate with lower urinary tract symptoms: Secondary | ICD-10-CM | POA: Diagnosis not present

## 2024-05-05 DIAGNOSIS — N529 Male erectile dysfunction, unspecified: Secondary | ICD-10-CM

## 2024-05-05 DIAGNOSIS — N138 Other obstructive and reflux uropathy: Secondary | ICD-10-CM

## 2024-05-05 DIAGNOSIS — N5201 Erectile dysfunction due to arterial insufficiency: Secondary | ICD-10-CM

## 2024-05-05 MED ORDER — TAMSULOSIN HCL 0.4 MG PO CAPS: 0.4000 mg | ORAL_CAPSULE | Freq: Every day | ORAL | 11 refills | Status: AC

## 2024-05-05 NOTE — Progress Notes (Signed)
 Assessment: 1. Organic impotence   2. BPH with obstruction/lower urinary tract symptoms     Plan: Resume tamsulosin  for BPH with LUTS.  Refill sent. He will try TriMix 10/30/1 0.3 ml per injection at home and let me know results Return to office in 3 months  Chief Complaint:  Chief Complaint  Patient presents with   Erectile Dysfunction    History of Present Illness:  Mitchell [name] is a 53 y.o. male who is seen for further evaluation of BPH with lower urinary tract symptoms, left flank pain, and erectile dysfunction. He has a history of lower urinary tract symptoms including frequency and nocturia for years.  He associates this with his use of torsemide .  No dysuria.  He reported an episode of gross hematuria prior to his hospitalization in March 2024.  Urinalysis at that time showed >50 WBCs, 11-20 RBCs, and rare bacteria.  No culture was obtained.  No further episodes of gross hematuria.  He was previously on tamsulosin . IPSS = 9. PVR = 86 ml  He reported intermittent left flank pain for 6 months. His symptoms are positional and associated with certain movements. CT abdomen and pelvis without contrast from 10/23/2022 showed no renal or ureteral calculi, no renal mass or evidence of obstruction.  He has a history of erectile dysfunction.  He has been unable to achieve an adequate erection for intercourse.  He has previously tried sildenafil  without improvement.  He has also tried a vacuum erection device with unsatisfactory results.  He has seen Dr. Rosalind at [facility] urology in 2021 and was prescribed prostaglandin for intracavernosal injection.  He reported minimal results with the dose prescribed and did not follow-up after his visit in November 2021.  PSA 3/24:  1.4 with 24% free  At his visit on 01/02/23, he reported that his urinary symptoms were stable.  He had recently run out of tamsulosin .  He was not having any dysuria or gross hematuria. He had not seen any results  with tadalafil  20 mg as needed.  He continued to have trouble achieving and maintaining his erections. He underwent a trial injection of Trimix 10/30/1 0.2 milliliters on 01/10/2023.  He did not achieve a satisfactory erection in the office.  He returns today for follow-up.  He has not been seen since July 2024.  At that time, a dose of 0.3 mL of Trimix 10/30/1 was recommended.  He reports that he has not used the injections in some time.  He also recently ran out of his tamsulosin .  He has noted an increase in his lower urinary tract symptoms since being off the medication.  No dysuria or gross hematuria. PSA from 10/25: 0.65  Portions of the above documentation were copied from a prior visit for review purposes only.   Past Medical History:  Past Medical History:  Diagnosis Date   Bipolar disorder (HCC)    CAD (coronary artery disease)    a. diffuse 3v CAD by cath 2019, medical therapy recommended.   Cataract    forming    Chronic systolic CHF (congestive heart failure) (HCC)    CVA (cerebral infarction)    No residual deficits   Depression    PTSD,    Diabetes mellitus    TYPE II; UNCONTROLLED BY HEMOGLOBIN A1c; STABLE AS  PER DISCHARGE   Headache(784.0)    Herpes simplex of male genitalia    History of colonic polyps    Hyperlipidemia    Hypertension    Myocardial infarction (  HCC) 1987   (while playing football)   Obesity    OSA (obstructive sleep apnea)    repeat study 2018 without significant OSA   Pneumonia    Post-cardiac injury syndrome (HCC)    History of cardiac injury from blunt trauma   Pulmonary hypertension (HCC)    a. moderately elevated PASP 07/2019.   PVCs (premature ventricular contractions)    Schizophrenia (HCC)    Goes to [facility]   Sleep apnea    Stroke [facility]) 2005   some left side weakness   Syncope    Recurrent, thought to be vasovagal. Also has h/o frequent PVCs.     Past Surgical History:  Past Surgical History:   Procedure Laterality Date   AMPUTATION Right 01/25/2021   Procedure: RIGHT BELOW KNEE AMPUTATION;  Surgeon: Harden Jerona GAILS, MD;  Location: [facility] OR;  Service: Orthopedics;  Laterality: Right;   CARDIAC CATHETERIZATION  12/19/2010   DIFFUSE NONOBSTRUCTIVE CAD; NONISCHEMIC CARDIOMYOPATHY; LEFT VENTRICULAR ANGIOGRAM WAS PERFORMED SECONDARY TO  ELEVATED LEFT VENTRICULAR FILLING PRESSURES   COLONOSCOPY     ~ age 57-23   COLONOSCOPY W/ POLYPECTOMY     I & D EXTREMITY Bilateral 10/24/2019   Procedure: IRRIGATION AND DEBRIDEMENT BILATERAL EXTREMITY WOUND ON FOOT;  Surgeon: Elisabeth Craig RAMAN, MD;  Location: [facility] OR;  Service: Plastics;  Laterality: Bilateral;   ICD IMPLANT N/A 01/13/2022   Procedure: ICD IMPLANT;  Surgeon: Waddell Danelle ORN, MD;  Location: [facility] INVASIVE CV LAB;  Service: Cardiovascular;  Laterality: N/A;   METATARSAL HEAD EXCISION Right 08/29/2018   Procedure: METATARSAL HEAD RESECTION;  Surgeon: Janit Thresa HERO, DPM;  Location: [facility] OR;  Service: Podiatry;  Laterality: Right;   METATARSAL OSTEOTOMY Right 08/29/2018   Procedure: SUB FIFTHE METATARSIA RIGHT FOOT;  Surgeon: Janit Thresa HERO, DPM;  Location: [facility] OR;  Service: Podiatry;  Laterality: Right;   MULTIPLE EXTRACTIONS WITH ALVEOLOPLASTY  01/27/2014   all my teeth; 4 Quadrants of alveoloplasty   MULTIPLE EXTRACTIONS WITH ALVEOLOPLASTY N/A 01/27/2014   Procedure: EXTRACTION OF TEETH #'1, 2, 3, 4, 5, 6, 7, 8, 9, 10, 11, 12, 13, 14, 15, 16, 17, 20, 21, 22, 23, 24, 25, 26, 27, 28, 29, 31 and 32 WITH ALVEOLOPLASTY;  Surgeon: Tanda JULIANNA Fanny, DDS;  Location: [facility] OR;  Service: Oral Surgery;  Laterality: N/A;   ORIF FINGER / THUMB FRACTURE Right    POLYPECTOMY     RIGHT HEART CATH N/A 01/27/2021   Procedure: RIGHT HEART CATH;  Surgeon: Cherrie Toribio SAUNDERS, MD;  [facility]: [facility] INVASIVE CV LAB;  Service: Cardiovascular;  Laterality: N/A;   RIGHT/LEFT HEART CATH AND CORONARY ANGIOGRAPHY N/A 09/26/2017   Procedure: RIGHT/LEFT HEART CATH AND CORONARY  ANGIOGRAPHY;  Surgeon: Cherrie Toribio SAUNDERS, MD;  [facility]: [facility] INVASIVE CV LAB;  Service: Cardiovascular;  Laterality: N/A;   RIGHT/LEFT HEART CATH AND CORONARY ANGIOGRAPHY N/A 04/12/2021   Procedure: RIGHT/LEFT HEART CATH AND CORONARY ANGIOGRAPHY;  Surgeon: Cherrie Toribio SAUNDERS, MD;  [facility]: [facility] INVASIVE CV LAB;  Service: Cardiovascular;  Laterality: N/A;   SKIN SPLIT GRAFT Bilateral 10/24/2019   Procedure: SKIN GRAFT SPLIT THICKNESS LEFT THIGH;  Surgeon: Elisabeth Craig RAMAN, MD;  Location: [facility] OR;  Service: Plastics;  Laterality: Bilateral;   WOUND DEBRIDEMENT Right 08/29/2018   Procedure: Debridement of ulcer on right fifth metatarsal;  Surgeon: Janit Thresa HERO, DPM;  Location: [facility] OR;  Service: Podiatry;  Laterality: Right;    Allergies:  Allergies  Allergen Reactions   Farxiga  [Dapagliflozin ] Other (See  Comments)    Patient reports loss of consciousness   Nsaids Anaphylaxis and Rash    Able to tolerate aspirin     Trulicity  [Dulaglutide ] Nausea And Vomiting    Family History:  Family History  Problem Relation Age of Onset   Heart disease Mother        MI   Heart failure Mother    Diabetes Mother        ALSO IN MOST OF HIS SIBLINGS; 2 UNLCES HAVE ALSO PASSED AWAY FROM DM   Cardiomyopathy Mother    Cancer - Ovarian Mother    Ovarian cancer Mother    Heart disease Father    Hypertension Father    Diabetes Father    Diabetes Sister    Diabetes Brother    Colon cancer Paternal Uncle    Colon cancer Paternal Uncle    Colon polyps Neg Hx    Esophageal cancer Neg Hx    Rectal cancer Neg Hx    Stomach cancer Neg Hx     Social History:  Social History   Tobacco Use   Smoking status: Never   Smokeless tobacco: Never  Vaping Use   Vaping status: Never Used  Substance Use Topics   Alcohol use: No   Drug use: No    ROS: Constitutional:  Negative for fever, chills, weight loss CV: Negative for chest pain, previous MI, hypertension Respiratory:  Negative for shortness of  breath, wheezing, sleep apnea, frequent cough GI:  Negative for nausea, vomiting, bloody stool, GERD  Physical exam: BP (!) 138/95   Pulse 66  GENERAL APPEARANCE:  Well appearing, well developed, well nourished, NAD HEENT:  Atraumatic, normocephalic, oropharynx clear NECK:  Supple without lymphadenopathy or thyromegaly ABDOMEN:  Soft, non-tender, no masses EXTREMITIES:  Moves all extremities well, without clubbing, cyanosis, or edema; Rt LE prosthesis NEUROLOGIC:  Alert and oriented x 3, in wheelchair, CN II-XII grossly intact MENTAL STATUS:  appropriate BACK:  Non-tender to palpation, No CVAT SKIN:  Warm, dry, and intact  Results: None

## 2024-05-06 ENCOUNTER — Other Ambulatory Visit: Payer: Self-pay | Admitting: Family Medicine

## 2024-06-08 ENCOUNTER — Other Ambulatory Visit (HOSPITAL_COMMUNITY): Payer: Self-pay | Admitting: Internal Medicine

## 2024-06-16 ENCOUNTER — Encounter (HOSPITAL_COMMUNITY): Payer: Self-pay

## 2024-06-16 ENCOUNTER — Emergency Department (HOSPITAL_COMMUNITY)

## 2024-06-16 ENCOUNTER — Other Ambulatory Visit: Payer: Self-pay

## 2024-06-16 ENCOUNTER — Emergency Department (HOSPITAL_COMMUNITY)
Admission: EM | Admit: 2024-06-16 | Discharge: 2024-06-16 | Disposition: A | Discharge status: Home / Self Care | Attending: Emergency Medicine | Admitting: Emergency Medicine

## 2024-06-16 DIAGNOSIS — R0602 Shortness of breath: Secondary | ICD-10-CM | POA: Insufficient documentation

## 2024-06-16 DIAGNOSIS — I2510 Atherosclerotic heart disease of native coronary artery without angina pectoris: Secondary | ICD-10-CM | POA: Diagnosis not present

## 2024-06-16 DIAGNOSIS — I509 Heart failure, unspecified: Secondary | ICD-10-CM | POA: Insufficient documentation

## 2024-06-16 DIAGNOSIS — E119 Type 2 diabetes mellitus without complications: Secondary | ICD-10-CM | POA: Insufficient documentation

## 2024-06-16 DIAGNOSIS — R600 Localized edema: Secondary | ICD-10-CM | POA: Insufficient documentation

## 2024-06-16 LAB — COMPREHENSIVE METABOLIC PANEL WITH GFR
ALT: 15 U/L (ref 0–44)
AST: 23 U/L (ref 15–41)
Albumin: 3.4 g/dL — ABNORMAL LOW (ref 3.5–5.0)
Alkaline Phosphatase: 67 U/L (ref 38–126)
Anion gap: 10 (ref 5–15)
BUN: 14 mg/dL (ref 6–20)
CO2: 22 mmol/L (ref 22–32)
Calcium: 8.8 mg/dL — ABNORMAL LOW (ref 8.9–10.3)
Chloride: 108 mmol/L (ref 98–111)
Creatinine, Ser: 1.23 mg/dL (ref 0.61–1.24)
GFR, Estimated: >60 mL/min (ref >60)
Glucose, Bld: 268 mg/dL — ABNORMAL HIGH (ref 70–99)
Potassium: 4.2 mmol/L (ref 3.5–5.1)
Sodium: 140 mmol/L (ref 135–145)
Total Bilirubin: 0.8 mg/dL (ref 0.0–1.2)
Total Protein: 7 g/dL (ref 6.5–8.1)

## 2024-06-16 LAB — CBC WITH DIFFERENTIAL/PLATELET
Abs Immature Granulocytes: 0.06 K/uL (ref 0.00–0.07)
Basophils Absolute: 0 K/uL (ref 0.0–0.1)
Basophils Relative: 0 %
Eosinophils Absolute: 0.1 K/uL (ref 0.0–0.5)
Eosinophils Relative: 1 %
HCT: 39.8 % (ref 39.0–52.0)
Hemoglobin: 13.1 g/dL (ref 13.0–17.0)
Immature Granulocytes: 1 %
Lymphocytes Relative: 10 %
Lymphs Abs: 1 K/uL (ref 0.7–4.0)
MCH: 30.2 pg (ref 26.0–34.0)
MCHC: 32.9 g/dL (ref 30.0–36.0)
MCV: 91.7 fL (ref 80.0–100.0)
Monocytes Absolute: 0.5 K/uL (ref 0.1–1.0)
Monocytes Relative: 6 %
Neutro Abs: 7.5 K/uL (ref 1.7–7.7)
Neutrophils Relative %: 82 %
Platelets: 109 K/uL — ABNORMAL LOW (ref 150–400)
RBC: 4.34 MIL/uL (ref 4.22–5.81)
RDW: 12.7 % (ref 11.5–15.5)
WBC: 9.2 K/uL (ref 4.0–10.5)
nRBC: 0 % (ref 0.0–0.2)

## 2024-06-16 LAB — LIPASE, BLOOD: Lipase: 89 U/L — ABNORMAL HIGH (ref 11–51)

## 2024-06-16 LAB — BRAIN NATRIURETIC PEPTIDE: B Natriuretic Peptide: 445.5 pg/mL — ABNORMAL HIGH (ref 0.0–100.0)

## 2024-06-16 LAB — TROPONIN I (HIGH SENSITIVITY): Troponin I (High Sensitivity): 25 ng/L — ABNORMAL HIGH (ref <18)

## 2024-06-16 NOTE — ED Provider Notes (Signed)
 [place] EMERGENCY DEPARTMENT AT [facility] Provider Note   CSN: [id number] Arrival date & time: 06/16/24  [id number]     History No chief complaint on file.   HPI Mitchell [name] is a 53 y.o. male presenting for chief complaint of shortness of breath.  He is a 53 year old male with a history of HLD, CAD, CHF, diabetes. He states that he started having diarrhea earlier this week which he attributes to his medications.  He stopped all of his medicines 3 days ago.  His diarrhea has improved but now he is having substantial shortness of breath and leg swelling.  He identified this is likely development of a heart failure exacerbation and took 60 mg of torsemide  this morning.  He became very nauseous and vomited these medications up after about 15 minutes No acute distress at this time but per EMS, by time of their arrival, he was very dyspneic.  Started on oxygen.  He does not use oxygen at baseline.  Patient's recorded medical, surgical, social, medication list and allergies were reviewed in the Snapshot window as part of the initial history.   Review of Systems   Review of Systems  Constitutional:  Positive for fatigue. Negative for chills and fever.  HENT:  Negative for ear pain and sore throat.   Eyes:  Negative for pain and visual disturbance.  Respiratory:  Positive for shortness of breath. Negative for cough.   Cardiovascular:  Negative for chest pain and palpitations.  Gastrointestinal:  Negative for abdominal pain and vomiting.  Genitourinary:  Negative for dysuria and hematuria.  Musculoskeletal:  Negative for arthralgias and back pain.  Skin:  Negative for color change and rash.  Neurological:  Negative for seizures and syncope.  All other systems reviewed and are negative.   Physical Exam Updated Vital Signs There were no vitals taken for this visit. Physical Exam Vitals and nursing note reviewed.  Constitutional:      General: He is not in acute distress.     Appearance: He is well-developed.  HENT:     Head: Normocephalic and atraumatic.  Eyes:     Conjunctiva/sclera: Conjunctivae normal.  Cardiovascular:     Rate and Rhythm: Normal rate and regular rhythm.     Heart sounds: No murmur heard. Pulmonary:     Effort: Pulmonary effort is normal. Tachypnea present. No respiratory distress.     Breath sounds: Normal breath sounds.  Abdominal:     Palpations: Abdomen is soft.     Tenderness: There is no abdominal tenderness.  Musculoskeletal:        General: No swelling.     Cervical back: Neck supple.     Left lower leg: Edema present.     Comments: RBKA  Skin:    General: Skin is warm and dry.     Capillary Refill: Capillary refill takes less than 2 seconds.  Neurological:     Mental Status: He is alert.  Psychiatric:        Mood and Affect: Mood normal.      ED Course/ Medical Decision Making/ A&P    Procedures Procedures   Medications Ordered in ED Medications - No data to display Medical Decision Making:   MEIR ELWOOD is a 53 y.o. male with a history of CHF , who presented to the ED today with acute on chronic SOB. They are endorsing worsening of their baseline dyspnea over the past 48 hours. Their baseline is no O2 requirement. At their baseline  they are able to get around the neighborhood and they are not able to at this time.   On my initial exam, the pt was SOB and tachypneic. LE edema present.  They are not endorsing fever/sputum production.  They are endorsing worsening cough  Reviewed and confirmed nursing documentation for past medical history, family history, social history.    Initial Assessment:   With the patient's presentation of SOB in the above setting, most likely diagnosis is CHF Exacerbation. Other diagnoses were considered including (but not limited to) CAP, PE, ACS, viral infection, PTX. These are considered less likely due to history of present illness and physical exam findings.   This is most  consistent with an acute life/limb threatening illness complicated by underlying chronic conditions.  Initial Plan:  EKG and single troponin to evaluate for cardiac pathology. Single troponin appropriate due to greater than 6 hours since maximal intensity of symptoms. Evaluation for infectious versus intrathoracic abnormality with chest x-ray  Evaluation for volume overload with BNP  Screening labs including CBC and Metabolic panel to evaluate for infectious or metabolic etiology of disease.  Patient's Wells score is low and patient does not warrant further objective evaluation for PE based on consistency of presentation of alternative diagnosis.  Objective evaluation as below reviewed   Initial Study Results:   Laboratory  All laboratory results reviewed without evidence of clinically relevant pathology.   ***Exceptions include: ***   ***EKG EKG was reviewed independently. Rate, rhythm, axis, intervals all examined and without medically relevant abnormality. ST segments without concerns for elevations.    Radiology:  All images reviewed independently. ***Agree with radiology report at this time.   No results found.    Consults: Case discussed with ***.   Assessment and Plan:     Clinical Impression: No diagnosis found.   Data Unavailable   Clinical Impression: No diagnosis found.   Data Unavailable   Final Clinical Impression(s) / ED Diagnoses Final diagnoses:  None    Rx / DC Orders ED Discharge Orders     None

## 2024-06-16 NOTE — ED Notes (Signed)
 Pt. Requested some blankets and is resting

## 2024-06-16 NOTE — Progress Notes (Signed)
 Heart Failure Navigator Progress Note  Assessed for Heart & Vascular TOC clinic readiness.  Patient does not meet criteria due to she is an Advanced Heart Failure Team patient of Dr. Bensimhon. .   Navigator will sign off at this time.   Stephane Haddock, BSN, Scientist, Clinical (histocompatibility And Immunogenetics) Only

## 2024-06-16 NOTE — ED Triage Notes (Signed)
 Patient BIB EMS for evaluation of shortness of breath. Per EMS report, patient having positional dyspnea x1 day. Hx CHF. Took 3 torsemide . EMS report patient had productive cough with pink sputum, after which patient reported feeling better.

## 2024-06-16 NOTE — Discharge Instructions (Addendum)
 Consider holding off on your Amitryptiline until you see your PCP. Otherwise I strongly recommend compliance to your other medications as we discussed.

## 2024-06-16 NOTE — ED Provider Notes (Signed)
 Assumed care at sign-out.  Recheck labs,...  9:11 AM Patient awake and alert, no supplemental oxygen, has been diuresing.   Garrick Charleston, MD 06/16/24 [phone]

## 2024-06-16 NOTE — ED Notes (Signed)
 Pt. Verbalized that he ready to go home; Denies any SOB; understands d/c instructions; Daughter picking him up

## 2024-06-18 ENCOUNTER — Ambulatory Visit: Admitting: Family

## 2024-06-18 ENCOUNTER — Encounter: Payer: Self-pay | Admitting: Family

## 2024-06-18 DIAGNOSIS — B351 Tinea unguium: Secondary | ICD-10-CM

## 2024-06-18 NOTE — Progress Notes (Signed)
 Office Visit Note   Patient: Mitchell [name]           Date of Birth: [birth year]           MRN: [id number] Visit Date: 06/18/2024              Requested by: Watt Harlene BROCKS, MD [address] [place],  [place] [address] PCP: Watt Harlene BROCKS, MD  Chief Complaint  Patient presents with   Left Foot - Follow-up      HPI: The patient is a 53 year old gentleman seen today for left foot evaluation.  He is unable to safely trim his own nails.  Reports these have become painful  Assessment & Plan: Visit Diagnoses: No diagnosis found.  Plan: Nails trimmed x 5.  Patient tolerated well.  Follow-up in the office in 3 months or as needed.  Follow-Up Instructions: No follow-ups on file.   Ortho Exam  Patient is alert, oriented, no adenopathy, well-dressed, normal affect, normal respiratory effort. On examination left foot he has thickened and discolored onychomycotic nails x 5 there is no ulceration present to the foot nails trimmed x 5 patient tolerated well.    Imaging: No results found. No images are attached to the encounter.  Labs: Lab Results  Component Value Date   HGBA1C 10.9 (A) 04/14/2024   HGBA1C 8.4 (H) 05/16/2023   HGBA1C 8.3 (H) 01/22/2023   ESRSEDRATE 10 06/10/2023   ESRSEDRATE 20 (H) 03/08/2022   ESRSEDRATE 81 (H) 10/21/2019   CRP 10.3 (H) 01/28/2021   CRP 12.8 (H) 01/27/2021   CRP 21.1 (H) 01/26/2021   LABURIC 10.9 (H) 10/23/2022   REPTSTATUS 01/25/2023 FINAL 01/20/2023   GRAMSTAIN NO WBC SEEN NO ORGANISMS SEEN  01/20/2023   CULT  01/20/2023    RARE STAPHYLOCOCCUS CAPITIS NO ANAEROBES ISOLATED Performed at [facility], [address], [place], [place] [address]    [facility] STAPHYLOCOCCUS CAPITIS 01/20/2023     Lab Results  Component Value Date   ALBUMIN  3.4 (L) 06/16/2024   ALBUMIN  3.9 04/30/2024   ALBUMIN  3.7 09/23/2023   PREALBUMIN 6.8 (L) 10/21/2019    Lab Results  Component Value Date   MG 2.2 09/24/2023   MG  2.3 11/29/2021   MG 1.5 (L) 04/12/2021   Lab Results  Component Value Date   VD25OH 11 (L) 08/16/2009    Lab Results  Component Value Date   PREALBUMIN 6.8 (L) 10/21/2019      Latest Ref Rng & Units 06/16/2024    6:41 AM 04/30/2024    8:46 AM 09/24/2023    7:35 AM  CBC EXTENDED  WBC 4.0 - 10.5 K/uL 9.2  11.9  7.6   RBC 4.22 - 5.81 MIL/uL 4.34  4.52  4.66   Hemoglobin 13.0 - 17.0 g/dL 86.8  86.5  86.0   HCT 39.0 - 52.0 % 39.8  40.8  42.0   Platelets 150 - 400 K/uL 109  115.0  118   NEUT# 1.7 - 7.7 K/uL 7.5     Lymph# 0.7 - 4.0 K/uL 1.0        There is no height or weight on file to calculate BMI.  Orders:  No orders of the defined types were placed in this encounter.  No orders of the defined types were placed in this encounter.    Procedures: No procedures performed  Clinical Data: No additional findings.  ROS:  All other systems negative, except as noted in the HPI.  Review of Systems  Objective: Vital Signs: There were no vitals taken for this visit.  Specialty Comments:  No specialty comments available.  PMFS History: Patient Active Problem List   Diagnosis Date Noted   Chest pain 09/23/2023   Diarrhea 09/23/2023   History of right below knee amputation (HCC) 03/16/2023   Diabetes mellitus (HCC) 03/16/2023   BPH with obstruction/lower urinary tract symptoms 11/21/2022   UTI (urinary tract infection) 10/23/2022   ICD (implantable cardioverter-defibrillator) in place 04/19/2022   Near syncope 12/08/2021   CKD (chronic kidney disease) stage 3, GFR 30-59 ml/min (HCC) 12/08/2021   PVD s/p R LE amputation  12/08/2021   Diabetic retinopathy associated with type 2 diabetes mellitus (HCC) 06/06/2021   CAD (coronary artery disease) with flat troponin  04/11/2021   S/P below knee amputation, right (HCC) 02/07/2021   AKI (acute kidney injury) 01/23/2021   QT prolongation 07/30/2019   Acute on chronic combined systolic and diastolic CHF (congestive heart  failure) (HCC) 07/29/2019   Stroke (HCC) 05/08/2017   Uncontrolled type 2 diabetes mellitus with hyperglycemia, with long-term current use of insulin  (HCC) 12/07/2015   Chronic systolic heart failure (HCC) 10/02/2011   Organic impotence 10/02/2011   Obstructive sleep apnea 10/11/2007   HLD (hyperlipidemia) 10/10/2007   HYPOKALEMIA 10/10/2007   Obesity, Class III, BMI 40-49.9 (morbid obesity) (HCC) 10/10/2007   Essential hypertension 10/10/2007   PREMATURE VENTRICULAR CONTRACTIONS 10/10/2007   History of colonic polyps 10/10/2007   Past Medical History:  Diagnosis Date   Bipolar disorder (HCC)    CAD (coronary artery disease)    a. diffuse 3v CAD by cath 2019, medical therapy recommended.   Cataract    forming    Chronic systolic CHF (congestive heart failure) (HCC)    CVA (cerebral infarction)    No residual deficits   Depression    PTSD,    Diabetes mellitus    TYPE II; UNCONTROLLED BY HEMOGLOBIN A1c; STABLE AS  PER DISCHARGE   Headache(784.0)    Herpes simplex of male genitalia    History of colonic polyps    Hyperlipidemia    Hypertension    Myocardial infarction (HCC) 1987   (while playing football)   Obesity    OSA (obstructive sleep apnea)    repeat study 2018 without significant OSA   Pneumonia    Post-cardiac injury syndrome (HCC)    History of cardiac injury from blunt trauma   Pulmonary hypertension (HCC)    a. moderately elevated PASP 07/2019.   PVCs (premature ventricular contractions)    Schizophrenia (HCC)    Goes to [facility]   Sleep apnea    Stroke [facility]) 2005   some left side weakness   Syncope    Recurrent, thought to be vasovagal. Also has h/o frequent PVCs.     Family History  Problem Relation Age of Onset   Heart disease Mother        MI   Heart failure Mother    Diabetes Mother        ALSO IN MOST OF HIS SIBLINGS; 2 UNLCES HAVE ALSO PASSED AWAY FROM DM   Cardiomyopathy Mother    Cancer - Ovarian Mother    Ovarian  cancer Mother    Heart disease Father    Hypertension Father    Diabetes Father    Diabetes Sister    Diabetes Brother    Colon cancer Paternal Uncle    Colon cancer Paternal Uncle    Colon polyps Neg  Hx    Esophageal cancer Neg Hx    Rectal cancer Neg Hx    Stomach cancer Neg Hx     Past Surgical History:  Procedure Laterality Date   AMPUTATION Right 01/25/2021   Procedure: RIGHT BELOW KNEE AMPUTATION;  Surgeon: Harden Jerona GAILS, MD;  Location: [facility] OR;  Service: Orthopedics;  Laterality: Right;   CARDIAC CATHETERIZATION  12/19/2010   DIFFUSE NONOBSTRUCTIVE CAD; NONISCHEMIC CARDIOMYOPATHY; LEFT VENTRICULAR ANGIOGRAM WAS PERFORMED SECONDARY TO  ELEVATED LEFT VENTRICULAR FILLING PRESSURES   COLONOSCOPY     ~ age 74-23   COLONOSCOPY W/ POLYPECTOMY     I & D EXTREMITY Bilateral 10/24/2019   Procedure: IRRIGATION AND DEBRIDEMENT BILATERAL EXTREMITY WOUND ON FOOT;  Surgeon: Elisabeth Craig RAMAN, MD;  Location: [facility] OR;  Service: Plastics;  Laterality: Bilateral;   ICD IMPLANT N/A 01/13/2022   Procedure: ICD IMPLANT;  Surgeon: Waddell Danelle ORN, MD;  Location: [facility] INVASIVE CV LAB;  Service: Cardiovascular;  Laterality: N/A;   METATARSAL HEAD EXCISION Right 08/29/2018   Procedure: METATARSAL HEAD RESECTION;  Surgeon: Janit Thresa HERO, DPM;  Location: [facility] OR;  Service: Podiatry;  Laterality: Right;   METATARSAL OSTEOTOMY Right 08/29/2018   Procedure: SUB FIFTHE METATARSIA RIGHT FOOT;  Surgeon: Janit Thresa HERO, DPM;  Location: [facility] OR;  Service: Podiatry;  Laterality: Right;   MULTIPLE EXTRACTIONS WITH ALVEOLOPLASTY  01/27/2014   all my teeth; 4 Quadrants of alveoloplasty   MULTIPLE EXTRACTIONS WITH ALVEOLOPLASTY N/A 01/27/2014   Procedure: EXTRACTION OF TEETH #'1, 2, 3, 4, 5, 6, 7, 8, 9, 10, 11, 12, 13, 14, 15, 16, 17, 20, 21, 22, 23, 24, 25, 26, 27, 28, 29, 31 and 32 WITH ALVEOLOPLASTY;  Surgeon: Tanda JULIANNA Fanny, DDS;  Location: [facility] OR;  Service: Oral Surgery;  Laterality: N/A;   ORIF FINGER / THUMB  FRACTURE Right    POLYPECTOMY     RIGHT HEART CATH N/A 01/27/2021   Procedure: RIGHT HEART CATH;  Surgeon: Cherrie Toribio SAUNDERS, MD;  Location: [facility] INVASIVE CV LAB;  Service: Cardiovascular;  Laterality: N/A;   RIGHT/LEFT HEART CATH AND CORONARY ANGIOGRAPHY N/A 09/26/2017   Procedure: RIGHT/LEFT HEART CATH AND CORONARY ANGIOGRAPHY;  Surgeon: Cherrie Toribio SAUNDERS, MD;  Location: [facility] INVASIVE CV LAB;  Service: Cardiovascular;  Laterality: N/A;   RIGHT/LEFT HEART CATH AND CORONARY ANGIOGRAPHY N/A 04/12/2021   Procedure: RIGHT/LEFT HEART CATH AND CORONARY ANGIOGRAPHY;  Surgeon: Cherrie Toribio SAUNDERS, MD;  Location: [facility] INVASIVE CV LAB;  Service: Cardiovascular;  Laterality: N/A;   SKIN SPLIT GRAFT Bilateral 10/24/2019   Procedure: SKIN GRAFT SPLIT THICKNESS LEFT THIGH;  Surgeon: Elisabeth Craig RAMAN, MD;  Location: [facility] OR;  Service: Plastics;  Laterality: Bilateral;   WOUND DEBRIDEMENT Right 08/29/2018   Procedure: Debridement of ulcer on right fifth metatarsal;  Surgeon: Janit Thresa HERO, DPM;  Location: [facility] OR;  Service: Podiatry;  Laterality: Right;   Social History   Occupational History   Occupation: Mortician  Tobacco Use   Smoking status: Never   Smokeless tobacco: Never  Vaping Use   Vaping status: Never Used  Substance and Sexual Activity   Alcohol use: No   Drug use: No   Sexual activity: Not on file

## 2024-07-10 ENCOUNTER — Ambulatory Visit: Payer: Self-pay | Admitting: Internal Medicine

## 2024-07-13 ENCOUNTER — Emergency Department (HOSPITAL_COMMUNITY)
Admission: EM | Admit: 2024-07-13 | Discharge: 2024-07-13 | Disposition: A | Discharge status: Home / Self Care | Attending: Emergency Medicine | Admitting: Emergency Medicine

## 2024-07-13 ENCOUNTER — Other Ambulatory Visit: Payer: Self-pay

## 2024-07-13 ENCOUNTER — Emergency Department (HOSPITAL_COMMUNITY)

## 2024-07-13 DIAGNOSIS — R1033 Periumbilical pain: Secondary | ICD-10-CM

## 2024-07-13 DIAGNOSIS — R7989 Other specified abnormal findings of blood chemistry: Secondary | ICD-10-CM | POA: Diagnosis not present

## 2024-07-13 DIAGNOSIS — Z7901 Long term (current) use of anticoagulants: Secondary | ICD-10-CM | POA: Diagnosis not present

## 2024-07-13 DIAGNOSIS — R109 Unspecified abdominal pain: Secondary | ICD-10-CM | POA: Diagnosis present

## 2024-07-13 DIAGNOSIS — Z794 Long term (current) use of insulin: Secondary | ICD-10-CM | POA: Diagnosis not present

## 2024-07-13 LAB — CBC
HCT: 39.9 % (ref 39.0–52.0)
Hemoglobin: 12.8 g/dL — ABNORMAL LOW (ref 13.0–17.0)
MCH: 30.1 pg (ref 26.0–34.0)
MCHC: 32.1 g/dL (ref 30.0–36.0)
MCV: 93.9 fL (ref 80.0–100.0)
Platelets: 159 K/uL (ref 150–400)
RBC: 4.25 MIL/uL (ref 4.22–5.81)
RDW: 12.7 % (ref 11.5–15.5)
WBC: 8.1 K/uL (ref 4.0–10.5)
nRBC: 0 % (ref 0.0–0.2)

## 2024-07-13 LAB — BASIC METABOLIC PANEL WITH GFR
Anion gap: 8 (ref 5–15)
BUN: 17 mg/dL (ref 6–20)
CO2: 33 mmol/L — ABNORMAL HIGH (ref 22–32)
Calcium: 8.6 mg/dL — ABNORMAL LOW (ref 8.9–10.3)
Chloride: 102 mmol/L (ref 98–111)
Creatinine, Ser: 1.38 mg/dL — ABNORMAL HIGH (ref 0.61–1.24)
GFR, Estimated: >60 mL/min (ref >60)
Glucose, Bld: 241 mg/dL — ABNORMAL HIGH (ref 70–99)
Potassium: 3.7 mmol/L (ref 3.5–5.1)
Sodium: 143 mmol/L (ref 135–145)

## 2024-07-13 LAB — TROPONIN I (HIGH SENSITIVITY): Troponin I (High Sensitivity): 22 ng/L — ABNORMAL HIGH (ref <18)

## 2024-07-13 MED ORDER — POLYETHYLENE GLYCOL 3350 17 GM/SCOOP PO POWD: 510.0000 g | Freq: Three times a day (TID) | ORAL | 0 refills | Status: AC

## 2024-07-13 NOTE — ED Provider Notes (Signed)
 [place] EMERGENCY DEPARTMENT AT [facility] Provider Note   CSN: [id number] Arrival date & time: 07/13/24  2052     Patient presents with: Abdominal Pain   Mitchell [name] is a 53 y.o. male.  {Add pertinent medical, surgical, social history, OB history to HPI:32947}  Abdominal Pain      Prior to Admission medications  Medication Sig Start Date End Date Taking? Authorizing Provider  Accu-Chek Softclix Lancets lancets Use to test blood sugars up to 4 times daily as needed. 12/08/21   Waddell Rake, MD  acetaminophen -codeine  (TYLENOL  #3) 300-30 MG tablet Take 1 tablet by mouth every 6 (six) hours as needed for moderate pain. 06/15/22   [name], Harlene BROCKS, MD  amitriptyline  (ELAVIL ) 10 MG tablet Take 1 tablet (10 mg total) by mouth at bedtime. 04/30/24   [name], Harlene BROCKS, MD  apixaban  (ELIQUIS ) 5 MG TABS tablet Take 1 tablet (5 mg total) by mouth 2 (two) times daily. 03/26/24   Aniceto Daphne CROME, NP  Ascorbic Acid  (VITAMIN C) 1000 MG tablet Take 1,000 mg by mouth daily.    [provider]  blood glucose meter kit and supplies Dispense based on patient and insurance preference. Use up to four times daily as directed. (FOR ICD-10 E10.9, E11.9). 06/15/22   [name], Harlene BROCKS, MD  Blood Glucose Monitoring Suppl (BLOOD GLUCOSE MONITOR SYSTEM) w/Device KIT Use to test blood sugar up to 4 times daily as needed. 12/08/21   Armenta Canning, MD  carvedilol  (COREG ) 6.25 MG tablet TAKE 1 TABLET BY MOUTH TWICE DAILY WITH A MEAL 04/09/24   Bensimhon, Toribio SAUNDERS, MD  Cholecalciferol (VITAMIN D-3 PO) Take 5,000 Units by mouth daily.    [provider]  gabapentin  (NEURONTIN ) 100 MG capsule Take 1 capsule (100 mg total) by mouth every evening. 04/30/24   [name], Harlene BROCKS, MD  glipiZIDE  (GLUCOTROL ) 5 MG tablet Take 1 tablet (5 mg total) by mouth 2 (two) times daily before a meal. 04/14/24   Shamleffer, Donell Cardinal, MD  glucose blood (ACCU-CHEK GUIDE) test strip Use to test  blood sugars up to 4 times daily as needed. 12/08/21   Waddell Rake, MD  Insulin  Glargine (BASAGLAR  KWIKPEN) 100 UNIT/ML Inject 36 Units into the skin daily. 04/14/24   Shamleffer, Ibtehal Jaralla, MD  Insulin  Pen Needle (BD PEN NEEDLE MICRO ULTRAFINE) 32G X 6 MM MISC 1 Device by Other route daily in the afternoon. 04/14/24   Shamleffer, Ibtehal Jaralla, MD  isosorbide  mononitrate (IMDUR ) 30 MG 24 hr tablet Take 1 tablet (30 mg total) by mouth at bedtime. 09/24/23   Arlon Carliss ORN, DO  loperamide  (IMODIUM ) 2 MG capsule Take 1 capsule (2 mg total) by mouth as needed for diarrhea or loose stools. 09/24/23   Arlon Carliss ORN, DO  nitroGLYCERIN  (NITROSTAT ) 0.4 MG SL tablet Place 1 tablet (0.4 mg total) under the tongue every 5 (five) minutes as needed for chest pain. 09/24/23   Arlon Carliss ORN, DO  pantoprazole  (PROTONIX ) 40 MG tablet Take 1 tablet (40 mg total) by mouth daily as needed (acid reflux). 02/13/22   [name], Jessica C, MD  potassium chloride  SA (KLOR-CON  M20) 20 MEQ tablet Take 1 tablet (20 mEq total) by mouth every other day. PLEASE SCHEDULE APPOINTMENT FOR MORE REFILLS [phone] OPTION 2 06/09/24   Bensimhon, Toribio SAUNDERS, MD  rosuvastatin  (CRESTOR ) 40 MG tablet Take 1 tablet (40 mg total) by mouth daily. 05/01/24   [name], Harlene BROCKS, MD  sacubitril -valsartan  (ENTRESTO ) 49-51 MG TAKE 1  TABLET BY MOUTH TWICE DAILY . APPOINTMENT REQUIRED FOR FUTURE REFILLS 05/06/24   [name], Harlene BROCKS, MD  spironolactone  (ALDACTONE ) 25 MG tablet Take 1 tablet (25 mg total) by mouth daily. PLEASE SCHEDULE APPOINTMENT FOR MORE REFILLS [phone] OPTION 2 06/09/24   Bensimhon, Toribio SAUNDERS, MD  tadalafil  (CIALIS ) 20 MG tablet Take 1 tablet (20 mg total) by mouth daily as needed for erectile dysfunction. 11/21/22   Stoneking, Adine PARAS., MD  tamsulosin  (FLOMAX ) 0.4 MG CAPS capsule Take 1 capsule (0.4 mg total) by mouth daily after supper. 05/05/24   Stoneking, Adine PARAS., MD  torsemide  (DEMADEX ) 20 MG tablet Take 2  tablets (40 mg total) by mouth daily. 11/19/23   Hayes Beckey CROME, NP    Allergies: Farxiga  [dapagliflozin ], Nsaids, and Trulicity  [dulaglutide ]    Review of Systems  Gastrointestinal:  Positive for abdominal pain.    Updated Vital Signs BP (!) 152/97 (BP Location: Left Arm)   Pulse 89   Temp 98.2 F (36.8 C) (Oral)   Resp 18   Ht 5' 11 (1.803 m)   Wt 129.3 kg   SpO2 100%   BMI 39.76 kg/m   Physical Exam  (all labs ordered are listed, but only abnormal results are displayed) Labs Reviewed  BASIC METABOLIC PANEL WITH GFR - Abnormal; Notable for the following components:      Result Value   CO2 33 (*)    Glucose, Bld 241 (*)    Creatinine, Ser 1.38 (*)    Calcium  8.6 (*)    All other components within normal limits  CBC - Abnormal; Notable for the following components:   Hemoglobin 12.8 (*)    All other components within normal limits  TROPONIN I (HIGH SENSITIVITY) - Abnormal; Notable for the following components:   Troponin I (High Sensitivity) 22 (*)    All other components within normal limits    EKG: EKG Interpretation Date/Time:  Sunday July 13 2024 21:26:12 EST Ventricular Rate:  86 PR Interval:  200 QRS Duration:  128 QT Interval:  440 QTC Calculation: 526 R Axis:   -52  Text Interpretation: Sinus rhythm with Premature atrial complexes with Abberant conduction Left axis deviation Left bundle branch block Abnormal ECG No significant change since last tracing Confirmed by Ellouise Fine ([id number]) on 07/13/2024 10:15:26 PM  Radiology: DG Chest 2 View Result Date: 07/13/2024 EXAM: 2 VIEW(S) XRAY OF THE CHEST 07/13/2024 09:48:21 PM COMPARISON: 06/16/2024 CLINICAL HISTORY: abdominal pain FINDINGS: LINES, TUBES AND DEVICES: Stable left chest cardiac AICD. LUNGS AND PLEURA: No focal pulmonary opacity. No pleural effusion. No pneumothorax. HEART AND MEDIASTINUM: Stable cardiomegaly. Stable left chest cardiac AICD. BONES AND SOFT TISSUES: No acute osseous  abnormality. ABDOMEN: Paucity of bowel gas. IMPRESSION: 1. No acute cardiopulmonary findings. 2. Stable left chest cardiac AICD and cardiomegaly. Electronically signed by: Greig Pique MD 07/13/2024 09:49 PM EST RP Workstation: [id number]    {Document cardiac monitor, telemetry assessment procedure when appropriate:32947} Procedures   Medications Ordered in the ED - No data to display    {Click here for ABCD2, HEART and other calculators REFRESH Note before signing:1}                              Medical Decision Making Amount and/or Complexity of Data Reviewed Labs: ordered. Radiology: ordered.   ***  {Document critical care time when appropriate  Document review of labs and clinical decision tools ie CHADS2VASC2, etc  Document your independent  review of radiology images and any outside records  Document your discussion with family members, caretakers and with consultants  Document social determinants of health affecting pt's care  Document your decision making why or why not admission, treatments were needed:32947:::1}   Final diagnoses:  None    ED Discharge Orders     None

## 2024-07-13 NOTE — ED Triage Notes (Addendum)
 PT here via [facility] from home for abdominal pain.  Pt states no bm x 3 days, except for a small amount after he drank prune juice today.  Hx of chf.  Denying sob, or orthopnea.  Describes pain as heavy.

## 2024-07-13 NOTE — Discharge Instructions (Addendum)
 As we discussed, I am happy to get a CT scan of your abdomen. However, as we discussed, I certainly think it is a high likelihood that you have constipation and since you'd rather treat this as a presumed diagnosis I will discharge you home w miralax . I am glad you are not having any nausea or vomiting.  If you develop any fever, nausea, vomiting, worsening pain please return to the emergency room.  Otherwise I think it is not unreasonable to start MiraLAX  you can do 3 capfuls of MiraLAX  and do a Gatorade bottle and drink that.  After that follow the bowel regimen instructions below.   GETTING TO GOOD BOWEL HEALTH. Irregular bowel habits such as constipation and diarrhea can lead to many problems over time.  Having one soft bowel movement a day is the most important way to prevent further problems.  The anorectal canal is designed to handle stretching and feces to safely manage our ability to get rid of solid waste (feces, poop, stool) out of our body.  BUT, hard constipated stools can act like ripping concrete bricks and diarrhea can be a burning fire to this very sensitive area of our body, causing inflamed hemorrhoids, anal fissures, increasing risk is perirectal abscesses, abdominal pain/bloating, an making irritable bowel worse.     The goal: ONE SOFT BOWEL MOVEMENT A DAY!  To have soft, regular bowel movements:  Drink at least 8 tall glasses of water  a day.   Take plenty of fiber.  Fiber is the undigested part of plant food that passes into the colon, acting s natures broom to encourage bowel motility and movement.  Fiber can absorb and hold large amounts of water . This results in a larger, bulkier stool, which is soft and easier to pass. Work gradually over several weeks up to 6 servings a day of fiber (25g a day even more if needed) in the form of: Vegetables -- Root (potatoes, carrots, turnips), leafy green (lettuce, salad greens, celery, spinach), or cooked high residue (cabbage, broccoli,  etc) Fruit -- Fresh (unpeeled skin & pulp), Dried (prunes, apricots, cherries, etc ),  or stewed ( applesauce)  Whole grain breads, pasta, etc (whole wheat)  Bran cereals  Bulking Agents -- This type of water -retaining fiber generally is easily obtained each day by one of the following:  Psyllium bran -- The psyllium plant is remarkable because its ground seeds can retain so much water . This product is available as Metamucil, Konsyl, Effersyllium, Per Diem Fiber, or the less expensive generic preparation in drug and health food stores. Although labeled a laxative, it really is not a laxative.  Methylcellulose -- This is another fiber derived from wood which also retains water . It is available as Citrucel. Polyethylene Glycol - and artificial fiber commonly called Miralax  or Glycolax .  It is helpful for people with gassy or bloated feelings with regular fiber Flax Seed - a less gassy fiber than psyllium No reading or other relaxing activity while on the toilet. If bowel movements take longer than 5 minutes, you are too constipated AVOID CONSTIPATION.  High fiber and water  intake usually takes care of this.  Sometimes a laxative is needed to stimulate more frequent bowel movements, but  Laxatives are not a good long-term solution as it can wear the colon out. Osmotics (Milk of Magnesia, Fleets phosphosoda, Magnesium  citrate, MiraLax , GoLytely ) are safer than  Stimulants (Senokot, Castor Oil, Dulcolax, Ex Lax)    Do not take laxatives for more than 7days in a row.  IF SEVERELY CONSTIPATED, try a Bowel Retraining Program: Do not use laxatives.  Eat a diet high in roughage, such as bran cereals and leafy vegetables.  Drink six (6) ounces of prune or apricot juice each morning.  Eat two (2) large servings of stewed fruit each day.  Take one (1) heaping tablespoon of a psyllium-based bulking agent twice a day. Use sugar-free sweetener when possible to avoid excessive calories.  Eat a normal  breakfast.  Set aside 15 minutes after breakfast to sit on the toilet, but do not strain to have a bowel movement.  If you do not have a bowel movement by the third day, use an enema and repeat the above steps.  Controlling diarrhea Switch to liquids and simpler foods for a few days to avoid stressing your intestines further. Avoid dairy products (especially milk & ice cream) for a short time.  The intestines often can lose the ability to digest lactose when stressed. Avoid foods that cause gassiness or bloating.  Typical foods include beans and other legumes, cabbage, broccoli, and dairy foods.  Every person has some sensitivity to other foods, so listen to our body and avoid those foods that trigger problems for you. Adding fiber (Citrucel, Metamucil, psyllium, Miralax ) gradually can help thicken stools by absorbing excess fluid and retrain the intestines to act more normally.  Slowly increase the dose over a few weeks.  Too much fiber too soon can backfire and cause cramping & bloating. Probiotics (such as active yogurt, Align, etc) may help repopulate the intestines and colon with normal bacteria and calm down a sensitive digestive tract.  Most studies show it to be of mild help, though, and such products can be costly. Medicines: Bismuth subsalicylate (ex. Kayopectate, Pepto Bismol) every 30 minutes for up to 6 doses can help control diarrhea.  Avoid if pregnant. Loperamide  (Immodium) can slow down diarrhea.  Start with two tablets (4mg  total) first and then try one tablet every 6 hours.  Avoid if you are having fevers or severe pain.  If you are not better or start feeling worse, stop all medicines and call your doctor for advice Call your doctor if you are getting worse or not better.  Sometimes further testing (cultures, endoscopy, X-ray studies, bloodwork, etc) may be needed to help diagnose and treat the cause of the diarrhea.  Managing Pain  Pain after surgery or related to activity is  often due to strain/injury to muscle, tendon, nerves and/or incisions.  This pain is usually short-term and will improve in a few months.   Many people find it helpful to do the following things TOGETHER to help speed the process of healing and to get back to regular activity more quickly:  Avoid heavy physical activity  no lifting greater than 20 pounds Do not push through the pain.  Listen to your body and avoid positions and maneuvers than reproduce the pain Walking is okay as tolerated, but go slowly and stop when getting sore.  Remember: If it hurts to do it, then dont do it! Take Anti-inflammatory medication  Take with food/snack around the clock for 1-2 weeks This helps the muscle and nerve tissues become less irritable and calm down faster Choose ONE of the following over-the-counter medications: Naproxen 220mg  tabs (ex. Aleve) 1-2 pills twice a day  Ibuprofen 200mg  tabs (ex. Advil, Motrin) 3-4 pills with every meal and just before bedtime Acetaminophen  500mg  tabs (Tylenol ) 1-2 pills with every meal and just before bedtime Use a Heating pad  or Ice/Cold Pack 4-6 times a day May use warm bath/hottub  or showers Try Gentle Massage and/or Stretching  at the area of pain many times a day stop if you feel pain - do not overdo it  Try these steps together to help you body heal faster and avoid making things get worse.  Doing just one of these things may not be enough.    If you are not getting better after two weeks or are noticing you are getting worse, contact our office for further advice; we may need to re-evaluate you & see what other things we can do to help.

## 2024-07-16 ENCOUNTER — Telehealth: Payer: Self-pay

## 2024-07-16 NOTE — Telephone Encounter (Signed)
 Spoke with pt qand monitor is not working so a new one is being sent out. Pt aware it will take 7-10 business days and will unplug current monitor and will send back with return kit

## 2024-07-16 NOTE — Telephone Encounter (Signed)
 Pt called in stating that his monitor is not working and needs help fixing it. Pt was at work when call was returned and will call back when he gets home from work.

## 2024-07-25 ENCOUNTER — Telehealth: Payer: Self-pay

## 2024-07-25 NOTE — Telephone Encounter (Signed)
 Pt has been in touch with cardiology, pt was advised a new device had been ordered. Pt called cardiology back again today and advised he did receive a package however no device was in the package. Cardiology is aware.

## 2024-07-25 NOTE — Telephone Encounter (Signed)
 Patient stated he received an envelope from [employer] but it did not have a device inside.  Patient provided the [employer]PS tracking number on the package is [phone] .  Patient wants a call back regarding this.

## 2024-07-25 NOTE — Telephone Encounter (Signed)
 Spoke with Alm with [employer] - only a return kit had been ordered previously. Monitor order placed today and should arrive within a week.  Spoke with patient. He reports he mailed old monitor back today. Explained that his new monitor has been ordered and should arrive within a week. Advised pt to plug it in for a couple of hours once received, then he can initiate a manual transmission. Advised to call back if monitor is not received by 1/6. Pt verbalized understanding and agreement with plan.

## 2024-07-25 NOTE — Telephone Encounter (Signed)
 Copied from CRM #[id number]. Topic: Clinical - Medical Advice >> Jul 25, 2024  1:09 PM Debby BROCKS wrote: Reason for CRM: Patient states that he's having issues with the monitor system of the defibrillator that he has on his chest. With the issues he's having he cannot send a signal and they cannot get the information of his heart. He cannot get a hold of Dr. Iven office and he would like to speak to a nurse instead to see what can be done about the device Callback: [phone]

## 2024-07-27 ENCOUNTER — Encounter (HOSPITAL_BASED_OUTPATIENT_CLINIC_OR_DEPARTMENT_OTHER): Payer: Self-pay | Admitting: Emergency Medicine

## 2024-07-27 ENCOUNTER — Emergency Department (HOSPITAL_BASED_OUTPATIENT_CLINIC_OR_DEPARTMENT_OTHER)
Admission: EM | Admit: 2024-07-27 | Discharge: 2024-07-27 | Disposition: A | Discharge status: Home / Self Care | Attending: Emergency Medicine | Admitting: Emergency Medicine

## 2024-07-27 ENCOUNTER — Other Ambulatory Visit: Payer: Self-pay

## 2024-07-27 ENCOUNTER — Emergency Department (HOSPITAL_BASED_OUTPATIENT_CLINIC_OR_DEPARTMENT_OTHER)

## 2024-07-27 DIAGNOSIS — D7389 Other diseases of spleen: Secondary | ICD-10-CM | POA: Insufficient documentation

## 2024-07-27 DIAGNOSIS — Z7901 Long term (current) use of anticoagulants: Secondary | ICD-10-CM | POA: Insufficient documentation

## 2024-07-27 DIAGNOSIS — K5900 Constipation, unspecified: Secondary | ICD-10-CM | POA: Diagnosis present

## 2024-07-27 DIAGNOSIS — R1012 Left upper quadrant pain: Secondary | ICD-10-CM

## 2024-07-27 LAB — BASIC METABOLIC PANEL WITH GFR
Anion gap: 12 (ref 5–15)
BUN: 18 mg/dL (ref 6–20)
CO2: 22 mmol/L (ref 22–32)
Calcium: 8.9 mg/dL (ref 8.9–10.3)
Chloride: 105 mmol/L (ref 98–111)
Creatinine, Ser: 1.19 mg/dL (ref 0.61–1.24)
GFR, Estimated: >60 mL/min
Glucose, Bld: 268 mg/dL — ABNORMAL HIGH (ref 70–99)
Potassium: 4.1 mmol/L (ref 3.5–5.1)
Sodium: 139 mmol/L (ref 135–145)

## 2024-07-27 LAB — CBC WITH DIFFERENTIAL/PLATELET
Abs Immature Granulocytes: 0.03 K/uL (ref 0.00–0.07)
Basophils Absolute: 0 K/uL (ref 0.0–0.1)
Basophils Relative: 1 %
Eosinophils Absolute: 0.1 K/uL (ref 0.0–0.5)
Eosinophils Relative: 1 %
HCT: 39.5 % (ref 39.0–52.0)
Hemoglobin: 13.2 g/dL (ref 13.0–17.0)
Immature Granulocytes: 0 %
Lymphocytes Relative: 14 %
Lymphs Abs: 1.1 K/uL (ref 0.7–4.0)
MCH: 30.6 pg (ref 26.0–34.0)
MCHC: 33.4 g/dL (ref 30.0–36.0)
MCV: 91.4 fL (ref 80.0–100.0)
Monocytes Absolute: 0.5 K/uL (ref 0.1–1.0)
Monocytes Relative: 6 %
Neutro Abs: 6 K/uL (ref 1.7–7.7)
Neutrophils Relative %: 78 %
Platelets: 113 K/uL — ABNORMAL LOW (ref 150–400)
RBC: 4.32 MIL/uL (ref 4.22–5.81)
RDW: 12.3 % (ref 11.5–15.5)
WBC: 7.7 K/uL (ref 4.0–10.5)
nRBC: 0 % (ref 0.0–0.2)

## 2024-07-27 LAB — LIPASE, BLOOD: Lipase: 52 U/L — ABNORMAL HIGH (ref 11–51)

## 2024-07-27 MED ORDER — IOHEXOL 300 MG/ML  SOLN
100.0000 mL | Freq: Once | INTRAMUSCULAR | Status: AC | PRN
  Administered 2024-07-27: 100 mL via INTRAVENOUS

## 2024-07-27 MED ORDER — BISACODYL 5 MG PO TBEC: 5.0000 mg | DELAYED_RELEASE_TABLET | Freq: Every day | ORAL | 0 refills | Status: AC | PRN

## 2024-07-27 MED ORDER — BISACODYL 10 MG RE SUPP: 10.0000 mg | Freq: Every day | RECTAL | 0 refills | Status: AC | PRN

## 2024-07-27 NOTE — ED Triage Notes (Signed)
 Arrived via [facility] with c/o of constipation and bloating. FSBS 272, BP 153/93, HR 78

## 2024-07-27 NOTE — ED Provider Notes (Signed)
 " [place] [facility] Provider Note   CSN: [id number] Arrival date & time: 07/27/24  9272     Patient presents with: Abdominal Pain   Mitchell [name] is a 53 y.o. male presented to ED with complaint of ongoing constipation and bloating.  Patient was seen in the ED about 2 weeks ago for abdominal constipation and bloating.  He had an abdominal x-ray done at that time with no emergent findings.  He was prescribed and told to take MiraLAX  and prune juice, which he has attempted.  He says he had 1 loose liquidy explosive diarrhea after the MiraLAX , but is still not passed regular stool.  He is only able to express a small amount of pasty like stool yesterday.  He denies vomiting.  He reports that he is having left upper abdominal pain that is more persistent, although this is an ongoing for a year, and says it gurgles like gas.   HPI     Prior to Admission medications  Medication Sig Start Date End Date Taking? Authorizing Provider  bisacodyl  (DULCOLAX) 10 MG suppository Place 1 suppository (10 mg total) rectally daily as needed for up to 10 doses for moderate constipation. 07/27/24  Yes Montana Bryngelson, Donnice PARAS, MD  bisacodyl  (DULCOLAX) 5 MG EC tablet Take 1 tablet (5 mg total) by mouth daily as needed for up to 10 doses for moderate constipation. 07/27/24  Yes Cottie Donnice PARAS, MD  Accu-Chek Softclix Lancets lancets Use to test blood sugars up to 4 times daily as needed. 12/08/21   Waddell Rake, MD  acetaminophen -codeine  (TYLENOL  #3) 300-30 MG tablet Take 1 tablet by mouth every 6 (six) hours as needed for moderate pain. 06/15/22   [name], Harlene BROCKS, MD  amitriptyline  (ELAVIL ) 10 MG tablet Take 1 tablet (10 mg total) by mouth at bedtime. 04/30/24   [name], Harlene BROCKS, MD  apixaban  (ELIQUIS ) 5 MG TABS tablet Take 1 tablet (5 mg total) by mouth 2 (two) times daily. 03/26/24   Aniceto Daphne CROME, NP  Ascorbic Acid  (VITAMIN C) 1000 MG tablet Take 1,000 mg by mouth  daily.    [provider]  blood glucose meter kit and supplies Dispense based on patient and insurance preference. Use up to four times daily as directed. (FOR ICD-10 E10.9, E11.9). 06/15/22   [name], Harlene BROCKS, MD  Blood Glucose Monitoring Suppl (BLOOD GLUCOSE MONITOR SYSTEM) w/Device KIT Use to test blood sugar up to 4 times daily as needed. 12/08/21   Armenta Canning, MD  carvedilol  (COREG ) 6.25 MG tablet TAKE 1 TABLET BY MOUTH TWICE DAILY WITH A MEAL 04/09/24   Bensimhon, Toribio SAUNDERS, MD  Cholecalciferol (VITAMIN D-3 PO) Take 5,000 Units by mouth daily.    [provider]  gabapentin  (NEURONTIN ) 100 MG capsule Take 1 capsule (100 mg total) by mouth every evening. 04/30/24   [name], Harlene BROCKS, MD  glipiZIDE  (GLUCOTROL ) 5 MG tablet Take 1 tablet (5 mg total) by mouth 2 (two) times daily before a meal. 04/14/24   Shamleffer, Donell Cardinal, MD  glucose blood (ACCU-CHEK GUIDE) test strip Use to test blood sugars up to 4 times daily as needed. 12/08/21   Waddell Rake, MD  Insulin  Glargine (BASAGLAR  KWIKPEN) 100 UNIT/ML Inject 36 Units into the skin daily. 04/14/24   Shamleffer, Ibtehal Jaralla, MD  Insulin  Pen Needle (BD PEN NEEDLE MICRO ULTRAFINE) 32G X 6 MM MISC 1 Device by Other route daily in the afternoon. 04/14/24   Shamleffer, Ibtehal Jaralla, MD  isosorbide  mononitrate (IMDUR ) 30 MG 24 hr tablet Take 1 tablet (30 mg total) by mouth at bedtime. 09/24/23   Arlon Carliss ORN, DO  loperamide  (IMODIUM ) 2 MG capsule Take 1 capsule (2 mg total) by mouth as needed for diarrhea or loose stools. 09/24/23   Arlon Carliss ORN, DO  nitroGLYCERIN  (NITROSTAT ) 0.4 MG SL tablet Place 1 tablet (0.4 mg total) under the tongue every 5 (five) minutes as needed for chest pain. 09/24/23   Arlon Carliss ORN, DO  pantoprazole  (PROTONIX ) 40 MG tablet Take 1 tablet (40 mg total) by mouth daily as needed (acid reflux). 02/13/22   [name], Jessica C, MD  polyethylene glycol powder (GLYCOLAX /MIRALAX ) 17 GM/SCOOP  powder Take 510 g by mouth in the morning, at noon, and at bedtime. One scoop in beverage of your choice with each meal. You may increase if you continue to be constipated and decrease if your stool becomes too loose. 07/13/24   Neldon Hamp RAMAN, PA  potassium chloride  SA (KLOR-CON  M20) 20 MEQ tablet Take 1 tablet (20 mEq total) by mouth every other day. PLEASE SCHEDULE APPOINTMENT FOR MORE REFILLS [phone] OPTION 2 06/09/24   Bensimhon, Toribio SAUNDERS, MD  rosuvastatin  (CRESTOR ) 40 MG tablet Take 1 tablet (40 mg total) by mouth daily. 05/01/24   [name], Harlene BROCKS, MD  sacubitril -valsartan  (ENTRESTO ) 49-51 MG TAKE 1 TABLET BY MOUTH TWICE DAILY . APPOINTMENT REQUIRED FOR FUTURE REFILLS 05/06/24   [name], Harlene BROCKS, MD  spironolactone  (ALDACTONE ) 25 MG tablet Take 1 tablet (25 mg total) by mouth daily. PLEASE SCHEDULE APPOINTMENT FOR MORE REFILLS [phone] OPTION 2 06/09/24   Bensimhon, Toribio SAUNDERS, MD  tadalafil  (CIALIS ) 20 MG tablet Take 1 tablet (20 mg total) by mouth daily as needed for erectile dysfunction. 11/21/22   Stoneking, Adine PARAS., MD  tamsulosin  (FLOMAX ) 0.4 MG CAPS capsule Take 1 capsule (0.4 mg total) by mouth daily after supper. 05/05/24   Stoneking, Adine PARAS., MD  torsemide  (DEMADEX ) 20 MG tablet Take 2 tablets (40 mg total) by mouth daily. 11/19/23   Hayes Beckey CROME, NP    Allergies: Farxiga  [dapagliflozin ], Nsaids, and Trulicity  [dulaglutide ]    Review of Systems  Updated Vital Signs BP (!) 156/81 (BP Location: Right Arm)   Pulse 88   Temp 98.2 F (36.8 C) (Oral)   Resp 20   SpO2 100%   Physical Exam Constitutional:      General: He is not in acute distress.    Appearance: He is obese.  HENT:     Head: Normocephalic and atraumatic.  Eyes:     Conjunctiva/sclera: Conjunctivae normal.     Pupils: Pupils are equal, round, and reactive to light.  Cardiovascular:     Rate and Rhythm: Normal rate and regular rhythm.  Pulmonary:     Effort: Pulmonary effort is normal. No  respiratory distress.  Abdominal:     General: There is no distension.     Tenderness: There is abdominal tenderness in the left upper quadrant.  Skin:    General: Skin is warm and dry.  Neurological:     General: No focal deficit present.     Mental Status: He is alert. Mental status is at baseline.  Psychiatric:        Mood and Affect: Mood normal.        Behavior: Behavior normal.     (all labs ordered are listed, but only abnormal results are displayed) Labs Reviewed  CBC WITH DIFFERENTIAL/PLATELET - Abnormal; Notable for the following components:  Result Value   Platelets 113 (*)    All other components within normal limits  LIPASE, BLOOD - Abnormal; Notable for the following components:   Lipase 52 (*)    All other components within normal limits  BASIC METABOLIC PANEL WITH GFR - Abnormal; Notable for the following components:   Glucose, Bld 268 (*)    All other components within normal limits    EKG: None  Radiology: CT ABDOMEN PELVIS W CONTRAST Result Date: 07/27/2024 CLINICAL DATA:  Left upper quadrant pain. EXAM: CT ABDOMEN AND PELVIS WITH CONTRAST TECHNIQUE: Multidetector CT imaging of the abdomen and pelvis was performed using the standard protocol following bolus administration of intravenous contrast. RADIATION DOSE REDUCTION: This exam was performed according to the departmental dose-optimization program which includes automated exposure control, adjustment of the mA and/or kV according to patient size and/or use of iterative reconstruction technique. CONTRAST:  OMNIPAQUE  IOHEXOL  300 MG/ML  SOLN COMPARISON:  10/23/2022 FINDINGS: Lower Chest: No acute findings. Hepatobiliary: No suspicious hepatic masses identified. Gallbladder is unremarkable. No evidence of biliary ductal dilatation. Pancreas:  No mass or inflammatory changes. Spleen: Within normal limits in size. Small low-attenuation lesion noted in the splenic dome, most likely benign in etiology.  Adrenals/Urinary Tract: No suspicious masses identified. No evidence of ureteral calculi or hydronephrosis. Unremarkable unopacified urinary bladder. Stomach/Bowel: No evidence of obstruction, inflammatory process or abnormal fluid collections. Normal appendix visualized. Vascular/Lymphatic: No pathologically enlarged lymph nodes. No acute vascular findings. Reproductive: Prior hysterectomy noted. Adnexal regions are unremarkable in appearance. Other:  None. Musculoskeletal:  No suspicious bone lesions identified. IMPRESSION: No acute findings. Small nonspecific low-attenuation splenic lesion, most likely benign in etiology. Recommend continued follow-up by CT or MRI in 6 months. This recommendation follows ACR consensus guidelines: White Paper of the ACR Incidental Findings Committee II on Splenic and Nodal Findings. J Am Coll Radiol [phone]. Electronically Signed   By: Norleen DELENA Kil M.D.   On: 07/27/2024 10:07     Procedures   Medications Ordered in the ED  iohexol  (OMNIPAQUE ) 300 MG/ML solution 100 mL (100 mLs Intravenous Contrast Given 07/27/24 0908)    Clinical Course as of 07/27/24 1044  Sun Jul 27, 2024  1035 Clarified with Dr Kil, the reading radiologist, that the patient is biologically male, and CT mention of prior hysterectomy likely erroneous, the radiologist notes this and will addend his report [MT]    Clinical Course User Index [MT] Fonnie Crookshanks, Donnice PARAS, MD                                 Medical Decision Making Amount and/or Complexity of Data Reviewed Labs: ordered. Radiology: ordered.  Risk OTC drugs. Prescription drug management.   This patient presents to the ED with concern for constipation, upper abdominal pain. This involves an extensive number of treatment options, and is a complaint that carries with it a high risk of complications and morbidity.  The differential diagnosis includes sigmoid diverticulitis versus fecal impaction versus functional  constipation versus ileus versus other   I ordered and personally interpreted labs.  The pertinent results include: No emergent findings  I ordered imaging studies including CT abdomen pelvis I independently visualized and interpreted imaging which showed incidental splenic lesion noted, no other emergent findings I agree with the radiologist interpretation  The patient was maintained on a cardiac monitor.  I personally viewed and interpreted the cardiac monitored which showed an underlying rhythm  of: Sinus rhythm  I have reviewed the patients home medicines and have made adjustments as needed  Test Considered: Doubt testicular pathology   After consideration of the diagnostic results and the patients response to treatment, I feel that the patent would benefit from outpatient follow-up.  I discussed with the patient the splenic lesion, which may correlate to his complaint of persistent left upper quadrant abdominal pain.  He can follow-up with his PCP for potential outpatient MRI for this issue.  He is still convinced he is having constipation problems, which is certainly possible, as there is some bowel irregularity over the past months.  We discussed trying Dulcolax as an alternative as it does not want to continue using MiraLAX .  I do not think he is needing an enema.  There is no evidence of fecal impaction on CT imaging. No concern for ileus/SBO.      Final diagnoses:  Splenic lesion  Left upper quadrant abdominal pain  Constipation, unspecified constipation type    ED Discharge Orders          Ordered    bisacodyl  (DULCOLAX) 5 MG EC tablet  Daily PRN        07/27/24 1044    bisacodyl  (DULCOLAX) 10 MG suppository  Daily PRN        07/27/24 1044               Cottie Donnice PARAS, MD 07/27/24 1045  "

## 2024-07-27 NOTE — Discharge Instructions (Addendum)
 You have a spot or lesion on your spleen. It is recommended that your primary care provider consider an MRI of your spleen in the next 6 months to better look at this spot.  You do not have a large amount of stool in your bowels, but you could consider

## 2024-07-27 NOTE — ED Notes (Signed)
 ED Provider at bedside.

## 2024-07-28 NOTE — Telephone Encounter (Signed)
Please see cardiology note.

## 2024-08-04 ENCOUNTER — Ambulatory Visit

## 2024-08-04 DIAGNOSIS — I5022 Chronic systolic (congestive) heart failure: Secondary | ICD-10-CM | POA: Diagnosis not present

## 2024-08-06 ENCOUNTER — Ambulatory Visit: Payer: Self-pay | Admitting: Cardiology

## 2024-08-06 ENCOUNTER — Ambulatory Visit: Admitting: Urology

## 2024-08-06 LAB — CUP PACEART REMOTE DEVICE CHECK
Battery Remaining Longevity: 108 mo
Battery Voltage: 3 V
Brady Statistic RV Percent Paced: 0 %
Date Time Interrogation Session: 20260103121400
HighPow Impedance: 56 Ohm
Implantable Lead Connection Status: 753985
Implantable Lead Implant Date: 20230616
Implantable Lead Location: 753860
Implantable Lead Model: 6935
Implantable Pulse Generator Implant Date: 20230616
Lead Channel Impedance Value: 342 Ohm
Lead Channel Impedance Value: 437 Ohm
Lead Channel Pacing Threshold Amplitude: 0.625 V
Lead Channel Pacing Threshold Pulse Width: 0.4 ms
Lead Channel Sensing Intrinsic Amplitude: 28.75 mV
Lead Channel Sensing Intrinsic Amplitude: 28.75 mV
Lead Channel Setting Pacing Amplitude: 2 V
Lead Channel Setting Pacing Pulse Width: 0.4 ms
Lead Channel Setting Sensing Sensitivity: 0.3 mV
Zone Setting Status: 755011

## 2024-08-07 NOTE — Progress Notes (Signed)
 Remote ICD Transmission

## 2024-08-09 NOTE — Progress Notes (Unsigned)
 [facility] [address] [place], [place] [address] [phone]  Date:  08/11/2024   Name:  Mitchell [name]   DOB:  06/09/1971   MRN:  [id number]  PCP:  Watt Harlene BROCKS, MD    Chief Complaint: No chief complaint on file.   History of Present Illness:  Mitchell [name] is a 54 y.o. very pleasant male patient who presents with the following:  Patient seen today for follow-up from recent ER visit I saw him last in October He also has endocrinology care for his diabetes History of CVA, schizophrenia versus bipolar disorder, OSA, obesity, CAD, HTN, hyperlipidemia, DM with history of right BKA, CRI, atrial fibrillation   It looks like he was actually in the ER 3 times recently, once on November 17 and then more recently on December 14 and 28 On December 14 he was seen with concern of constipation and bloating, he was advised regarding a bowel regimen.  He went back on December 28 with persistent symptoms of same.  He was reassured and discharged to home.  He did have a CT scan which noted incidental spleen finding which will need to be followed up IMPRESSION: No acute findings. Small nonspecific low-attenuation splenic lesion, most likely benign in etiology. Recommend continued follow-up by CT or MRI in 6 months. This recommendation follows ACR consensus guidelines: White Paper of the ACR Incidental Findings Committee II on Splenic and Nodal Findings. J Am Coll Radiol [phone]  Discussed the use of AI scribe software for clinical note transcription with the patient, who gave verbal consent to proceed.  History of Present Illness     Patient Active Problem List   Diagnosis Date Noted   Chest pain 09/23/2023   Diarrhea 09/23/2023   History of right below knee amputation (HCC) 03/16/2023   Diabetes mellitus (HCC) 03/16/2023   BPH with obstruction/lower urinary tract symptoms 11/21/2022   UTI (urinary  tract infection) 10/23/2022   ICD (implantable cardioverter-defibrillator) in place 04/19/2022   Near syncope 12/08/2021   CKD (chronic kidney disease) stage 3, GFR 30-59 ml/min (HCC) 12/08/2021   PVD s/p R LE amputation  12/08/2021   Diabetic retinopathy associated with type 2 diabetes mellitus (HCC) 06/06/2021   CAD (coronary artery disease) with flat troponin  04/11/2021   S/P below knee amputation, right (HCC) 02/07/2021   AKI (acute kidney injury) 01/23/2021   QT prolongation 07/30/2019   Acute on chronic combined systolic and diastolic CHF (congestive heart failure) (HCC) 07/29/2019   Stroke (HCC) 05/08/2017   Uncontrolled type 2 diabetes mellitus with hyperglycemia, with long-term current use of insulin  (HCC) 12/07/2015   Chronic systolic heart failure (HCC) 10/02/2011   Organic impotence 10/02/2011   Obstructive sleep apnea 10/11/2007   HLD (hyperlipidemia) 10/10/2007   HYPOKALEMIA 10/10/2007   Obesity, Class III, BMI 40-49.9 (morbid obesity) (HCC) 10/10/2007   Essential hypertension 10/10/2007   PREMATURE VENTRICULAR CONTRACTIONS 10/10/2007   History of colonic polyps 10/10/2007    Past Medical History:  Diagnosis Date   Bipolar disorder (HCC)    CAD (coronary artery disease)    a. diffuse 3v CAD by cath 2019, medical therapy recommended.   Cataract    forming    Chronic systolic CHF (congestive heart failure) (HCC)    CVA (cerebral infarction)    No residual deficits   Depression    PTSD,    Diabetes mellitus    TYPE II; UNCONTROLLED BY HEMOGLOBIN  A1c; STABLE AS  PER DISCHARGE   Headache(784.0)    Herpes simplex of male genitalia    History of colonic polyps    Hyperlipidemia    Hypertension    Myocardial infarction (HCC) 1987   (while playing football)   Obesity    OSA (obstructive sleep apnea)    repeat study 2018 without significant OSA   Pneumonia    Post-cardiac injury syndrome (HCC)    History of cardiac injury from blunt trauma   Pulmonary  hypertension (HCC)    a. moderately elevated PASP 07/2019.   PVCs (premature ventricular contractions)    Schizophrenia (HCC)    Goes to [facility]   Sleep apnea    Stroke [facility]) 2005   some left side weakness   Syncope    Recurrent, thought to be vasovagal. Also has h/o frequent PVCs.     Past Surgical History:  Procedure Laterality Date   AMPUTATION Right 01/25/2021   Procedure: RIGHT BELOW KNEE AMPUTATION;  Surgeon: Harden Jerona GAILS, MD;  Location: [facility] OR;  Service: Orthopedics;  Laterality: Right;   CARDIAC CATHETERIZATION  12/19/2010   DIFFUSE NONOBSTRUCTIVE CAD; NONISCHEMIC CARDIOMYOPATHY; LEFT VENTRICULAR ANGIOGRAM WAS PERFORMED SECONDARY TO  ELEVATED LEFT VENTRICULAR FILLING PRESSURES   COLONOSCOPY     ~ age [age]   COLONOSCOPY W/ POLYPECTOMY     I & D EXTREMITY Bilateral 10/24/2019   Procedure: IRRIGATION AND DEBRIDEMENT BILATERAL EXTREMITY WOUND ON FOOT;  Surgeon: Elisabeth Craig RAMAN, MD;  Location: [facility] OR;  Service: Plastics;  Laterality: Bilateral;   ICD IMPLANT N/A 01/13/2022   Procedure: ICD IMPLANT;  Surgeon: Waddell Danelle ORN, MD;  Location: [facility] INVASIVE CV LAB;  Service: Cardiovascular;  Laterality: N/A;   METATARSAL HEAD EXCISION Right 08/29/2018   Procedure: METATARSAL HEAD RESECTION;  Surgeon: Janit Thresa HERO, DPM;  Location: [facility] OR;  Service: Podiatry;  Laterality: Right;   METATARSAL OSTEOTOMY Right 08/29/2018   Procedure: SUB FIFTHE METATARSIA RIGHT FOOT;  Surgeon: Janit Thresa HERO, DPM;  Location: [facility] OR;  Service: Podiatry;  Laterality: Right;   MULTIPLE EXTRACTIONS WITH ALVEOLOPLASTY  01/27/2014   all my teeth; 4 Quadrants of alveoloplasty   MULTIPLE EXTRACTIONS WITH ALVEOLOPLASTY N/A 01/27/2014   Procedure: EXTRACTION OF TEETH #'1, 2, 3, 4, 5, 6, 7, 8, 9, 10, 11, 12, 13, 14, 15, 16, 17, 20, 21, 22, 23, 24, 25, 26, 27, 28, 29, 31 and 32 WITH ALVEOLOPLASTY;  Surgeon: Tanda JULIANNA Fanny, DDS;  Location: [facility] OR;  Service: Oral Surgery;  Laterality: N/A;   ORIF  FINGER / THUMB FRACTURE Right    POLYPECTOMY     RIGHT HEART CATH N/A 01/27/2021   Procedure: RIGHT HEART CATH;  Surgeon: Cherrie Toribio SAUNDERS, MD;  [facility]: [facility] INVASIVE CV LAB;  Service: Cardiovascular;  Laterality: N/A;   RIGHT/LEFT HEART CATH AND CORONARY ANGIOGRAPHY N/A 09/26/2017   Procedure: RIGHT/LEFT HEART CATH AND CORONARY ANGIOGRAPHY;  Surgeon: Cherrie Toribio SAUNDERS, MD;  Location: [facility] INVASIVE CV LAB;  Service: Cardiovascular;  Laterality: N/A;   RIGHT/LEFT HEART CATH AND CORONARY ANGIOGRAPHY N/A 04/12/2021   Procedure: RIGHT/LEFT HEART CATH AND CORONARY ANGIOGRAPHY;  Surgeon: Cherrie Toribio SAUNDERS, MD;  [facility]: [facility] INVASIVE CV LAB;  Service: Cardiovascular;  Laterality: N/A;   SKIN SPLIT GRAFT Bilateral 10/24/2019   Procedure: SKIN GRAFT SPLIT THICKNESS LEFT THIGH;  Surgeon: Elisabeth Craig RAMAN, MD;  Location: [facility] OR;  Service: Plastics;  Laterality: Bilateral;   WOUND DEBRIDEMENT Right 08/29/2018   Procedure: Debridement of ulcer on right  fifth metatarsal;  Surgeon: Janit Thresa HERO, DPM;  [facility]: [facility];  Service: Podiatry;  Laterality: Right;    Social History[1]  Family History  Problem Relation Age of Onset   Heart disease Mother        MI   Heart failure Mother    Diabetes Mother        ALSO IN MOST OF HIS SIBLINGS; 2 UNLCES HAVE ALSO PASSED AWAY FROM DM   Cardiomyopathy Mother    Cancer - Ovarian Mother    Ovarian cancer Mother    Heart disease Father    Hypertension Father    Diabetes Father    Diabetes Sister    Diabetes Brother    Colon cancer Paternal Uncle    Colon cancer Paternal Uncle    Colon polyps Neg Hx    Esophageal cancer Neg Hx    Rectal cancer Neg Hx    Stomach cancer Neg Hx     Allergies[2]  Medication list has been reviewed and updated.  Medications Ordered Prior to Encounter[3]  Review of Systems:  As per HPI- otherwise negative.   Physical Examination: There were no vitals filed for this visit. There were no vitals filed for this  visit. There is no height or weight on file to calculate BMI. Ideal Body Weight:    GEN: no acute distress. HEENT: Atraumatic, Normocephalic.  Ears and Nose: No external deformity. CV: RRR, No M/G/R. No JVD. No thrill. No extra heart sounds. PULM: CTA B, no wheezes, crackles, rhonchi. No retractions. No resp. distress. No accessory muscle use. ABD: S, NT, ND, +BS. No rebound. No HSM. EXTR: No c/c/e PSYCH: Normally interactive. Conversant.    Assessment and Plan: No diagnosis found.  Assessment & Plan   Signed Harlene Schroeder, MD    [1]  Social History Tobacco Use   Smoking status: Never   Smokeless tobacco: Never  Vaping Use   Vaping status: Never Used  Substance Use Topics   Alcohol use: No   Drug use: No  [2]  Allergies Allergen Reactions   Farxiga  [Dapagliflozin ] Other (See Comments)    Patient reports loss of consciousness   Nsaids Anaphylaxis and Rash    Able to tolerate aspirin     Trulicity  [Dulaglutide ] Nausea And Vomiting  [3]  Current Outpatient Medications on File Prior to Visit  Medication Sig Dispense Refill   Accu-Chek Softclix Lancets lancets Use to test blood sugars up to 4 times daily as needed. 100 each 0   acetaminophen -codeine  (TYLENOL  #3) 300-30 MG tablet Take 1 tablet by mouth every 6 (six) hours as needed for moderate pain. 30 tablet 0   amitriptyline  (ELAVIL ) 10 MG tablet Take 1 tablet (10 mg total) by mouth at bedtime. 90 tablet 1   apixaban  (ELIQUIS ) 5 MG TABS tablet Take 1 tablet (5 mg total) by mouth 2 (two) times daily. 60 tablet 0   Ascorbic Acid  (VITAMIN C) 1000 MG tablet Take 1,000 mg by mouth daily.     bisacodyl  (DULCOLAX) 10 MG suppository Place 1 suppository (10 mg total) rectally daily as needed for up to 10 doses for moderate constipation. 10 suppository 0   bisacodyl  (DULCOLAX) 5 MG EC tablet Take 1 tablet (5 mg total) by mouth daily as needed for up to 10 doses for moderate constipation. 10 tablet 0   blood glucose meter kit  and supplies Dispense based on patient and insurance preference. Use up to four times daily as directed. (FOR ICD-10 E10.9, E11.9). 1 each 0  Blood Glucose Monitoring Suppl (BLOOD GLUCOSE MONITOR SYSTEM) w/Device KIT Use to test blood sugar up to 4 times daily as needed. 1 kit 0   carvedilol  (COREG ) 6.25 MG tablet TAKE 1 TABLET BY MOUTH TWICE DAILY WITH A MEAL 180 tablet 0   Cholecalciferol (VITAMIN D-3 PO) Take 5,000 Units by mouth daily.     gabapentin  (NEURONTIN ) 100 MG capsule Take 1 capsule (100 mg total) by mouth every evening. 90 capsule 1   glipiZIDE  (GLUCOTROL ) 5 MG tablet Take 1 tablet (5 mg total) by mouth 2 (two) times daily before a meal. 180 tablet 3   glucose blood (ACCU-CHEK GUIDE) test strip Use to test blood sugars up to 4 times daily as needed. 100 each 0   Insulin  Glargine (BASAGLAR  KWIKPEN) 100 UNIT/ML Inject 36 Units into the skin daily. 45 mL 3   Insulin  Pen Needle (BD PEN NEEDLE MICRO ULTRAFINE) 32G X 6 MM MISC 1 Device by Other route daily in the afternoon. 100 each 3   isosorbide  mononitrate (IMDUR ) 30 MG 24 hr tablet Take 1 tablet (30 mg total) by mouth at bedtime. 30 tablet 1   loperamide  (IMODIUM ) 2 MG capsule Take 1 capsule (2 mg total) by mouth as needed for diarrhea or loose stools. 30 capsule 0   nitroGLYCERIN  (NITROSTAT ) 0.4 MG SL tablet Place 1 tablet (0.4 mg total) under the tongue every 5 (five) minutes as needed for chest pain. 30 tablet 0   pantoprazole  (PROTONIX ) 40 MG tablet Take 1 tablet (40 mg total) by mouth daily as needed (acid reflux). 30 tablet 6   polyethylene glycol powder (GLYCOLAX /MIRALAX ) 17 GM/SCOOP powder Take 510 g by mouth in the morning, at noon, and at bedtime. One scoop in beverage of your choice with each meal. You may increase if you continue to be constipated and decrease if your stool becomes too loose. 255 g 0   potassium chloride  SA (KLOR-CON  M20) 20 MEQ tablet Take 1 tablet (20 mEq total) by mouth every other day. PLEASE SCHEDULE  APPOINTMENT FOR MORE REFILLS [phone] OPTION 2 45 tablet 0   rosuvastatin  (CRESTOR ) 40 MG tablet Take 1 tablet (40 mg total) by mouth daily. 90 tablet 0   sacubitril -valsartan  (ENTRESTO ) 49-51 MG TAKE 1 TABLET BY MOUTH TWICE DAILY . APPOINTMENT REQUIRED FOR FUTURE REFILLS 60 tablet 3   spironolactone  (ALDACTONE ) 25 MG tablet Take 1 tablet (25 mg total) by mouth daily. PLEASE SCHEDULE APPOINTMENT FOR MORE REFILLS [phone] OPTION 2 90 tablet 0   tadalafil  (CIALIS ) 20 MG tablet Take 1 tablet (20 mg total) by mouth daily as needed for erectile dysfunction. 10 tablet 11   tamsulosin  (FLOMAX ) 0.4 MG CAPS capsule Take 1 capsule (0.4 mg total) by mouth daily after supper. 30 capsule 11   torsemide  (DEMADEX ) 20 MG tablet Take 2 tablets (40 mg total) by mouth daily.     No current facility-administered medications on file prior to visit.   "

## 2024-08-11 ENCOUNTER — Telehealth: Payer: Self-pay | Admitting: Family Medicine

## 2024-08-11 ENCOUNTER — Ambulatory Visit: Admitting: Family Medicine

## 2024-08-11 VITALS — BP 116/82 | HR 70 | Temp 98.6°F | Ht 71.0 in | Wt 286.0 lb

## 2024-08-11 DIAGNOSIS — Z794 Long term (current) use of insulin: Secondary | ICD-10-CM | POA: Diagnosis not present

## 2024-08-11 DIAGNOSIS — K529 Noninfective gastroenteritis and colitis, unspecified: Secondary | ICD-10-CM | POA: Diagnosis not present

## 2024-08-11 DIAGNOSIS — Z89511 Acquired absence of right leg below knee: Secondary | ICD-10-CM | POA: Diagnosis not present

## 2024-08-11 DIAGNOSIS — E1169 Type 2 diabetes mellitus with other specified complication: Secondary | ICD-10-CM | POA: Diagnosis not present

## 2024-08-11 DIAGNOSIS — D7389 Other diseases of spleen: Secondary | ICD-10-CM | POA: Diagnosis not present

## 2024-08-11 LAB — HEMOGLOBIN A1C: Hgb A1c MFr Bld: 9 % — ABNORMAL HIGH (ref 4.6–6.5)

## 2024-08-11 NOTE — Patient Instructions (Signed)
 It was good to see you today I will get you back in with gastroenterology to set up a colonoscopy and discuss your chronic diarrhea/ constipation issues.  At this point I don't think this is due to medication so I don't think you need to stop any of your meds  I will order a CT scan in 6 months to check on your spleen spot as well!    If all is well please see me in 4-6 months

## 2024-08-11 NOTE — Telephone Encounter (Signed)
 Pt dropped off form that was talked about in appt today for pcp to fill out. Pt asked if it could be asap because he is going out of town soon. Please call pt when it is ready to be picked up. Placed paper in pcps box.

## 2024-08-11 NOTE — Telephone Encounter (Signed)
Form has been placed in PCPs folder for review.

## 2024-08-12 ENCOUNTER — Ambulatory Visit: Admitting: Urology

## 2024-08-12 NOTE — Progress Notes (Unsigned)
 "  Assessment: 1. Organic impotence   2. BPH with obstruction/lower urinary tract symptoms     Plan: Continue tamsulosin  for BPH with LUTS.  Refill sent. He will try TriMix 10/30/1 0.3 ml per injection at home and let me know results Return to office in 3 months  Chief Complaint:  No chief complaint on file.   History of Present Illness:  Mitchell [name] is a 54 y.o. male who is seen for further evaluation of BPH with lower urinary tract symptoms, left flank pain, and erectile dysfunction. He has a history of lower urinary tract symptoms including frequency and nocturia for years.  He associates this with his use of torsemide .  No dysuria.  He reported an episode of gross hematuria prior to his hospitalization in March 2024.  Urinalysis at that time showed >50 WBCs, 11-20 RBCs, and rare bacteria.  No culture was obtained.  No further episodes of gross hematuria.  He was previously on tamsulosin . IPSS = 9. PVR = 86 ml  He reported intermittent left flank pain for 6 months. His symptoms are positional and associated with certain movements. CT abdomen and pelvis without contrast from 10/23/2022 showed no renal or ureteral calculi, no renal mass or evidence of obstruction.  He has a history of erectile dysfunction.  He has been unable to achieve an adequate erection for intercourse.  He has previously tried sildenafil  without improvement.  He has also tried a vacuum erection device with unsatisfactory results.  He has seen Dr. Rosalind at [facility] urology in 2021 and was prescribed prostaglandin for intracavernosal injection.  He reported minimal results with the dose prescribed and did not follow-up after his visit in November 2021.  PSA 3/24:  1.4 with 24% free  At his visit on 01/02/23, he reported that his urinary symptoms were stable.  He had recently run out of tamsulosin .  He was not having any dysuria or gross hematuria. He had not seen any results with tadalafil  20 mg as needed.  He  continued to have trouble achieving and maintaining his erections. He underwent a trial injection of Trimix 10/30/1 0.2 milliliters on 01/10/2023.  He did not achieve a satisfactory erection in the office.  He was seen in October 2025.  This was his first follow-up visit since July 2024.  He reported that he has not used the Trimix injections for some time.  He also ran out of his tamsulosin .  He noted an increase in his lower urinary tract symptoms since being off the medication.  No dysuria or gross hematuria. PSA from 10/25: 0.65 He was restarted on tamsulosin .   Portions of the above documentation were copied from a prior visit for review purposes only.   Past Medical History:  Past Medical History:  Diagnosis Date   Bipolar disorder (HCC)    CAD (coronary artery disease)    a. diffuse 3v CAD by cath 2019, medical therapy recommended.   Cataract    forming    Chronic systolic CHF (congestive heart failure) (HCC)    CVA (cerebral infarction)    No residual deficits   Depression    PTSD,    Diabetes mellitus    TYPE II; UNCONTROLLED BY HEMOGLOBIN A1c; STABLE AS  PER DISCHARGE   Headache(784.0)    Herpes simplex of male genitalia    History of colonic polyps    Hyperlipidemia    Hypertension    Myocardial infarction (HCC) 1987   (while playing football)   Obesity  OSA (obstructive sleep apnea)    repeat study 2018 without significant OSA   Pneumonia    Post-cardiac injury syndrome (HCC)    History of cardiac injury from blunt trauma   Pulmonary hypertension (HCC)    a. moderately elevated PASP 07/2019.   PVCs (premature ventricular contractions)    Schizophrenia (HCC)    Goes to [facility]   Sleep apnea    Stroke [facility]) 2005   some left side weakness   Syncope    Recurrent, thought to be vasovagal. Also has h/o frequent PVCs.     Past Surgical History:  Past Surgical History:  Procedure Laterality Date   AMPUTATION Right 01/25/2021   Procedure:  RIGHT BELOW KNEE AMPUTATION;  Surgeon: Harden Jerona GAILS, MD;  Location: [facility] OR;  Service: Orthopedics;  Laterality: Right;   CARDIAC CATHETERIZATION  12/19/2010   DIFFUSE NONOBSTRUCTIVE CAD; NONISCHEMIC CARDIOMYOPATHY; LEFT VENTRICULAR ANGIOGRAM WAS PERFORMED SECONDARY TO  ELEVATED LEFT VENTRICULAR FILLING PRESSURES   COLONOSCOPY     ~ age 4-23   COLONOSCOPY W/ POLYPECTOMY     I & D EXTREMITY Bilateral 10/24/2019   Procedure: IRRIGATION AND DEBRIDEMENT BILATERAL EXTREMITY WOUND ON FOOT;  Surgeon: Elisabeth Craig RAMAN, MD;  Location: [facility] OR;  Service: Plastics;  Laterality: Bilateral;   ICD IMPLANT N/A 01/13/2022   Procedure: ICD IMPLANT;  Surgeon: Waddell Danelle ORN, MD;  Location: [facility] INVASIVE CV LAB;  Service: Cardiovascular;  Laterality: N/A;   METATARSAL HEAD EXCISION Right 08/29/2018   Procedure: METATARSAL HEAD RESECTION;  Surgeon: Janit Thresa HERO, DPM;  Location: [facility] OR;  Service: Podiatry;  Laterality: Right;   METATARSAL OSTEOTOMY Right 08/29/2018   Procedure: SUB FIFTHE METATARSIA RIGHT FOOT;  Surgeon: Janit Thresa HERO, DPM;  [facility] OR;  Service: Podiatry;  Laterality: Right;   MULTIPLE EXTRACTIONS WITH ALVEOLOPLASTY  01/27/2014   all my teeth; 4 Quadrants of alveoloplasty   MULTIPLE EXTRACTIONS WITH ALVEOLOPLASTY N/A 01/27/2014   Procedure: EXTRACTION OF TEETH #'1, 2, 3, 4, 5, 6, 7, 8, 9, 10, 11, 12, 13, 14, 15, 16, 17, 20, 21, 22, 23, 24, 25, 26, 27, 28, 29, 31 and 32 WITH ALVEOLOPLASTY;  Surgeon: Tanda JULIANNA Fanny, DDS;  Location: [facility] OR;  Service: Oral Surgery;  Laterality: N/A;   ORIF FINGER / THUMB FRACTURE Right    POLYPECTOMY     RIGHT HEART CATH N/A 01/27/2021   Procedure: RIGHT HEART CATH;  Surgeon: Cherrie Toribio SAUNDERS, MD;  [facility] INVASIVE CV LAB;  Service: Cardiovascular;  Laterality: N/A;   RIGHT/LEFT HEART CATH AND CORONARY ANGIOGRAPHY N/A 09/26/2017   Procedure: RIGHT/LEFT HEART CATH AND CORONARY ANGIOGRAPHY;  Surgeon: Cherrie Toribio SAUNDERS, MD;  [facility] INVASIVE CV  LAB;  Service: Cardiovascular;  Laterality: N/A;   RIGHT/LEFT HEART CATH AND CORONARY ANGIOGRAPHY N/A 04/12/2021   Procedure: RIGHT/LEFT HEART CATH AND CORONARY ANGIOGRAPHY;  Surgeon: Cherrie Toribio SAUNDERS, MD;  [facility]: [facility] INVASIVE CV LAB;  Service: Cardiovascular;  Laterality: N/A;   SKIN SPLIT GRAFT Bilateral 10/24/2019   Procedure: SKIN GRAFT SPLIT THICKNESS LEFT THIGH;  Surgeon: Elisabeth Craig RAMAN, MD;  Location: [facility] OR;  Service: Plastics;  Laterality: Bilateral;   WOUND DEBRIDEMENT Right 08/29/2018   Procedure: Debridement of ulcer on right fifth metatarsal;  Surgeon: Janit Thresa HERO, DPM;  Location: [facility] OR;  Service: Podiatry;  Laterality: Right;    Allergies:  Allergies  Allergen Reactions   Farxiga  [Dapagliflozin ] Other (See Comments)    Patient reports loss of consciousness   Nsaids Anaphylaxis  and Rash    Able to tolerate aspirin     Trulicity  [Dulaglutide ] Nausea And Vomiting    Family History:  Family History  Problem Relation Age of Onset   Heart disease Mother        MI   Heart failure Mother    Diabetes Mother        ALSO IN MOST OF HIS SIBLINGS; 2 UNLCES HAVE ALSO PASSED AWAY FROM DM   Cardiomyopathy Mother    Cancer - Ovarian Mother    Ovarian cancer Mother    Heart disease Father    Hypertension Father    Diabetes Father    Diabetes Sister    Diabetes Brother    Colon cancer Paternal Uncle    Colon cancer Paternal Uncle    Colon polyps Neg Hx    Esophageal cancer Neg Hx    Rectal cancer Neg Hx    Stomach cancer Neg Hx     Social History:  Social History   Tobacco Use   Smoking status: Never   Smokeless tobacco: Never  Vaping Use   Vaping status: Never Used  Substance Use Topics   Alcohol use: No   Drug use: No    ROS: Constitutional:  Negative for fever, chills, weight loss CV: Negative for chest pain, previous MI, hypertension Respiratory:  Negative for shortness of breath, wheezing, sleep apnea, frequent cough GI:  Negative for nausea,  vomiting, bloody stool, GERD  Physical exam: There were no vitals taken for this visit. GENERAL APPEARANCE:  Well appearing, well developed, well nourished, NAD HEENT:  Atraumatic, normocephalic, oropharynx clear NECK:  Supple without lymphadenopathy or thyromegaly ABDOMEN:  Soft, non-tender, no masses EXTREMITIES:  Moves all extremities well, without clubbing, cyanosis, or edema NEUROLOGIC:  Alert and oriented x 3, normal gait, CN II-XII grossly intact MENTAL STATUS:  appropriate BACK:  Non-tender to palpation, No CVAT SKIN:  Warm, dry, and intact  Results: None  "

## 2024-09-04 ENCOUNTER — Other Ambulatory Visit: Payer: Self-pay

## 2024-09-04 ENCOUNTER — Encounter (HOSPITAL_COMMUNITY): Payer: Self-pay

## 2024-09-04 ENCOUNTER — Inpatient Hospital Stay (HOSPITAL_COMMUNITY): Admission: EM | Admit: 2024-09-04 | Source: Home / Self Care | Attending: Family Medicine | Admitting: Family Medicine

## 2024-09-04 DIAGNOSIS — I10 Essential (primary) hypertension: Secondary | ICD-10-CM | POA: Diagnosis present

## 2024-09-04 DIAGNOSIS — E1165 Type 2 diabetes mellitus with hyperglycemia: Secondary | ICD-10-CM

## 2024-09-04 DIAGNOSIS — I509 Heart failure, unspecified: Principal | ICD-10-CM

## 2024-09-04 DIAGNOSIS — R079 Chest pain, unspecified: Secondary | ICD-10-CM | POA: Diagnosis present

## 2024-09-04 DIAGNOSIS — I5023 Acute on chronic systolic (congestive) heart failure: Secondary | ICD-10-CM | POA: Diagnosis present

## 2024-09-04 DIAGNOSIS — I2510 Atherosclerotic heart disease of native coronary artery without angina pectoris: Secondary | ICD-10-CM | POA: Diagnosis present

## 2024-09-04 DIAGNOSIS — I639 Cerebral infarction, unspecified: Secondary | ICD-10-CM | POA: Diagnosis present

## 2024-09-04 NOTE — ED Triage Notes (Signed)
 Pt BIB [facility] from home c/o of [facility] and chest pain x2 days. Pt states at 0300 today he was awaken from sleep due to not being able to breath. Pt does have a history of CHF and states he took his Torsemide  today for the first time in three days. Pt does not wear 02 at home. 10/10 pain to the chest.   Hx: CHF   En route pt was given 0.4 mg Nitroglycerin    EMS vitals  Original BP 140/100  Secondary BP 124/70  100% sp02 on 6L nasal cannula

## 2024-09-05 ENCOUNTER — Inpatient Hospital Stay (HOSPITAL_COMMUNITY)

## 2024-09-05 ENCOUNTER — Encounter (HOSPITAL_COMMUNITY): Payer: Self-pay | Admitting: Family Medicine

## 2024-09-05 ENCOUNTER — Emergency Department (HOSPITAL_COMMUNITY)

## 2024-09-05 DIAGNOSIS — I5023 Acute on chronic systolic (congestive) heart failure: Secondary | ICD-10-CM | POA: Diagnosis present

## 2024-09-05 LAB — ECHOCARDIOGRAM COMPLETE
AR max vel: 2.73 cm2
AV Area VTI: 3.08 cm2
AV Area mean vel: 2.58 cm2
AV Mean grad: 3 mmHg
AV Peak grad: 4.4 mmHg
Ao pk vel: 1.05 m/s
Area-P 1/2: 4.41 cm2
Height: 72 in
S' Lateral: 5.9 cm
Weight: 4624 [oz_av]

## 2024-09-05 LAB — CBC
HCT: 35.5 % — ABNORMAL LOW (ref 39.0–52.0)
HCT: 38.4 % — ABNORMAL LOW (ref 39.0–52.0)
Hemoglobin: 11.8 g/dL — ABNORMAL LOW (ref 13.0–17.0)
Hemoglobin: 12.4 g/dL — ABNORMAL LOW (ref 13.0–17.0)
MCH: 29.6 pg (ref 26.0–34.0)
MCH: 30.1 pg (ref 26.0–34.0)
MCHC: 32.3 g/dL (ref 30.0–36.0)
MCHC: 33.2 g/dL (ref 30.0–36.0)
MCV: 90.6 fL (ref 80.0–100.0)
MCV: 91.6 fL (ref 80.0–100.0)
Platelets: 130 10*3/uL — ABNORMAL LOW (ref 150–400)
Platelets: 152 10*3/uL (ref 150–400)
RBC: 3.92 MIL/uL — ABNORMAL LOW (ref 4.22–5.81)
RBC: 4.19 MIL/uL — ABNORMAL LOW (ref 4.22–5.81)
RDW: 12.1 % (ref 11.5–15.5)
RDW: 12.1 % (ref 11.5–15.5)
WBC: 11.1 10*3/uL — ABNORMAL HIGH (ref 4.0–10.5)
WBC: 11.4 10*3/uL — ABNORMAL HIGH (ref 4.0–10.5)
nRBC: 0 % (ref 0.0–0.2)
nRBC: 0 % (ref 0.0–0.2)

## 2024-09-05 LAB — CBG MONITORING, ED
Glucose-Capillary: 286 mg/dL — ABNORMAL HIGH (ref 70–99)
Glucose-Capillary: 251 mg/dL — ABNORMAL HIGH (ref 70–99)
Glucose-Capillary: 235 mg/dL — ABNORMAL HIGH (ref 70–99)
Glucose-Capillary: 259 mg/dL — ABNORMAL HIGH (ref 70–99)
Glucose-Capillary: 309 mg/dL — ABNORMAL HIGH (ref 70–99)

## 2024-09-05 LAB — BASIC METABOLIC PANEL WITH GFR
Anion gap: 11 (ref 5–15)
Anion gap: 11 (ref 5–15)
BUN: 18 mg/dL (ref 6–20)
BUN: 17 mg/dL (ref 6–20)
CO2: 25 mmol/L (ref 22–32)
CO2: 27 mmol/L (ref 22–32)
Calcium: 8.5 mg/dL — ABNORMAL LOW (ref 8.9–10.3)
Calcium: 8.8 mg/dL — ABNORMAL LOW (ref 8.9–10.3)
Chloride: 103 mmol/L (ref 98–111)
Chloride: 102 mmol/L (ref 98–111)
Creatinine, Ser: 1.29 mg/dL — ABNORMAL HIGH (ref 0.61–1.24)
Creatinine, Ser: 1.37 mg/dL — ABNORMAL HIGH (ref 0.61–1.24)
GFR, Estimated: >60 mL/min
GFR, Estimated: >60 mL/min
Glucose, Bld: 283 mg/dL — ABNORMAL HIGH (ref 70–99)
Glucose, Bld: 280 mg/dL — ABNORMAL HIGH (ref 70–99)
Potassium: 4 mmol/L (ref 3.5–5.1)
Potassium: 4.4 mmol/L (ref 3.5–5.1)
Sodium: 139 mmol/L (ref 135–145)
Sodium: 139 mmol/L (ref 135–145)

## 2024-09-05 LAB — PRO BRAIN NATRIURETIC PEPTIDE: Pro Brain Natriuretic Peptide: 3024 pg/mL — ABNORMAL HIGH

## 2024-09-05 LAB — TROPONIN T, HIGH SENSITIVITY
Troponin T High Sensitivity: 76 ng/L — ABNORMAL HIGH (ref 0–19)
Troponin T High Sensitivity: 73 ng/L — ABNORMAL HIGH (ref 0–19)

## 2024-09-05 LAB — RESP PANEL BY RT-PCR (RSV, FLU A&B, COVID)  RVPGX2
Influenza A by PCR: NEGATIVE
Influenza B by PCR: NEGATIVE
Resp Syncytial Virus by PCR: NEGATIVE
SARS Coronavirus 2 by RT PCR: NEGATIVE

## 2024-09-05 LAB — MAGNESIUM: Magnesium: 2 mg/dL (ref 1.7–2.4)

## 2024-09-05 LAB — HIV ANTIBODY (ROUTINE TESTING W REFLEX): HIV Screen 4th Generation wRfx: NONREACTIVE

## 2024-09-05 MED ORDER — SODIUM CHLORIDE 0.9% FLUSH
3.0000 mL | Freq: Two times a day (BID) | INTRAVENOUS | Status: AC
  Administered 2024-09-05 (×2): 3 mL via INTRAVENOUS

## 2024-09-05 MED ORDER — SPIRONOLACTONE 25 MG PO TABS
25.0000 mg | ORAL_TABLET | Freq: Every day | ORAL | Status: AC
  Administered 2024-09-05: 25 mg via ORAL
  Filled 2024-09-05: qty 2

## 2024-09-05 MED ORDER — AMITRIPTYLINE HCL 10 MG PO TABS
10.0000 mg | ORAL_TABLET | Freq: Every day | ORAL | Status: AC
  Administered 2024-09-05 (×2): 10 mg via ORAL
  Filled 2024-09-05 (×2): qty 1

## 2024-09-05 MED ORDER — APIXABAN 5 MG PO TABS
5.0000 mg | ORAL_TABLET | Freq: Two times a day (BID) | ORAL | Status: AC
  Administered 2024-09-05 (×3): 5 mg via ORAL
  Filled 2024-09-05 (×3): qty 1

## 2024-09-05 MED ORDER — POLYETHYLENE GLYCOL 3350 17 G PO PACK: 17.0000 g | PACK | Freq: Every day | ORAL | Status: AC | PRN

## 2024-09-05 MED ORDER — SACUBITRIL-VALSARTAN 49-51 MG PO TABS
1.0000 | ORAL_TABLET | Freq: Two times a day (BID) | ORAL | Status: AC
  Administered 2024-09-05 (×3): 1 via ORAL
  Filled 2024-09-05 (×3): qty 1

## 2024-09-05 MED ORDER — ROSUVASTATIN CALCIUM 20 MG PO TABS
40.0000 mg | ORAL_TABLET | Freq: Every day | ORAL | Status: AC
  Administered 2024-09-05: 40 mg via ORAL
  Filled 2024-09-05: qty 2

## 2024-09-05 MED ORDER — ACETAMINOPHEN 325 MG PO TABS: 650.0000 mg | ORAL_TABLET | Freq: Four times a day (QID) | ORAL | Status: AC | PRN

## 2024-09-05 MED ORDER — ISOSORBIDE MONONITRATE ER 30 MG PO TB24
30.0000 mg | ORAL_TABLET | Freq: Every day | ORAL | Status: AC
  Administered 2024-09-05: 30 mg via ORAL
  Filled 2024-09-05: qty 1

## 2024-09-05 MED ORDER — OXYCODONE HCL 5 MG PO TABS: 5.0000 mg | ORAL_TABLET | ORAL | Status: AC | PRN

## 2024-09-05 MED ORDER — GABAPENTIN 100 MG PO CAPS
100.0000 mg | ORAL_CAPSULE | Freq: Every evening | ORAL | Status: AC
  Administered 2024-09-05: 100 mg via ORAL
  Filled 2024-09-05: qty 1

## 2024-09-05 MED ORDER — ACETAMINOPHEN 650 MG RE SUPP: 650.0000 mg | Freq: Four times a day (QID) | RECTAL | Status: AC | PRN

## 2024-09-05 MED ORDER — PERFLUTREN LIPID MICROSPHERE
1.0000 mL | INTRAVENOUS | Status: AC | PRN
  Administered 2024-09-05: 2 mL via INTRAVENOUS

## 2024-09-05 MED ORDER — FUROSEMIDE 10 MG/ML IJ SOLN
40.0000 mg | INTRAMUSCULAR | Status: AC
  Administered 2024-09-05: 40 mg via INTRAVENOUS
  Filled 2024-09-05: qty 4

## 2024-09-05 MED ORDER — CARVEDILOL 6.25 MG PO TABS
6.2500 mg | ORAL_TABLET | Freq: Two times a day (BID) | ORAL | Status: AC
  Administered 2024-09-05 (×2): 6.25 mg via ORAL
  Filled 2024-09-05 (×2): qty 2

## 2024-09-05 MED ORDER — INSULIN ASPART 100 UNIT/ML IJ SOLN
0.0000 [IU] | Freq: Three times a day (TID) | INTRAMUSCULAR | Status: AC
  Administered 2024-09-05 (×2): 3 [IU] via SUBCUTANEOUS
  Administered 2024-09-05: 2 [IU] via SUBCUTANEOUS
  Filled 2024-09-05: qty 2
  Filled 2024-09-05 (×2): qty 3

## 2024-09-05 MED ORDER — INSULIN GLARGINE-YFGN 100 UNIT/ML ~~LOC~~ SOLN
15.0000 [IU] | Freq: Every day | SUBCUTANEOUS | Status: AC
  Administered 2024-09-05: 15 [IU] via SUBCUTANEOUS
  Filled 2024-09-05 (×2): qty 0.15

## 2024-09-05 MED ORDER — PANTOPRAZOLE SODIUM 40 MG PO TBEC: 40.0000 mg | DELAYED_RELEASE_TABLET | Freq: Every day | ORAL | Status: AC | PRN

## 2024-09-05 MED ORDER — TAMSULOSIN HCL 0.4 MG PO CAPS
0.4000 mg | ORAL_CAPSULE | Freq: Every day | ORAL | Status: AC
  Administered 2024-09-05: 0.4 mg via ORAL
  Filled 2024-09-05: qty 1

## 2024-09-05 MED ORDER — FUROSEMIDE 10 MG/ML IJ SOLN
40.0000 mg | Freq: Two times a day (BID) | INTRAMUSCULAR | Status: AC
  Administered 2024-09-05 (×2): 40 mg via INTRAVENOUS
  Filled 2024-09-05 (×2): qty 4

## 2024-09-05 MED ORDER — INSULIN ASPART 100 UNIT/ML IJ SOLN
0.0000 [IU] | Freq: Every day | INTRAMUSCULAR | Status: AC
  Administered 2024-09-05: 4 [IU] via SUBCUTANEOUS
  Filled 2024-09-05: qty 4

## 2024-09-05 NOTE — ED Notes (Signed)
 Pt placed on 2 liters 02 nasal cannula due to desat to 90% sp02 while sleeping.

## 2024-09-05 NOTE — Progress Notes (Signed)
" °   09/05/24 1723  TOC Brief Assessment  Insurance and Status Reviewed  Patient has primary care physician Yes  Home environment has been reviewed Resides with spouse and daughter  Prior level of function: Independent at baseline  Prior/Current Home Services No current home services   [facility] consult received for d/c planning needs. Met with patient at bedside. Patient resides with his wife of 25 years and his daughter. He is independent with ADLs. Patient uses his prosthetic (R BKA) and either a cane or FWW for ambulation. Patient also has a w/c and checks his bg daily. No other DME reported.   No current CM needs reported. Plan for patient to d/c home when medically appropriate.   Merilee Batty, MSN, RN Case Management [phone] "

## 2024-09-05 NOTE — ED Notes (Signed)
 CCMD called and pt put on the monitor

## 2024-09-05 NOTE — H&P (Signed)
 " History and Physical    Mitchell [name] FMW:[id number] DOB: 01-Feb-1971 DOA: 09/04/2024  PCP: Watt Harlene BROCKS, MD   Patient coming from: Home   Chief Complaint: SOB, orthopnea, chest pain   HPI: Mitchell [name] is a 54 y.o. male with medical history significant for hypertension, hyperlipidemia, diabetes mellitus, history of CVA, CAD, HFrEF with ICD, and atrial fibrillation on Eliquis  who presents with shortness of breath and chest pain.  Patient reports 2 or 3 days of worsening shortness of breath and orthopnea.  He woke in the middle the night last night with acute breathlessness, felt a little better after sitting up, but continues to be short of breath, prompting EMS to be called.  He has also been experiencing pain across the anterior chest that he attributes to labored breathing.  He has not noticed any leg swelling, denies fevers or chills, and acknowledges that he often misses doses of his medications including torsemide  and Eliquis .  ED Course: Upon arrival to the ED, patient is found to be afebrile and saturating well on room air with tachypnea, mild tachycardia, and elevated BP.  Labs are most notable for glucose 283, creatinine 1.29, troponin 76, and proBNP 3024.  Chest x-ray reveals cardiomegaly and mild bibasilar atelectasis.  Patient was treated with 40 mg IV Lasix  in the ED.  Review of Systems:  All other systems reviewed and apart from HPI, are negative.  Past Medical History:  Diagnosis Date   Bipolar disorder (HCC)    CAD (coronary artery disease)    a. diffuse 3v CAD by cath 2019, medical therapy recommended.   Cataract    forming    Chronic systolic CHF (congestive heart failure) (HCC)    CVA (cerebral infarction)    No residual deficits   Depression    PTSD,    Diabetes mellitus    TYPE II; UNCONTROLLED BY HEMOGLOBIN A1c; STABLE AS  PER DISCHARGE   Headache(784.0)    Herpes simplex of male genitalia    History of colonic polyps    Hyperlipidemia     Hypertension    Myocardial infarction (HCC) 1987   (while playing football)   Obesity    OSA (obstructive sleep apnea)    repeat study 2018 without significant OSA   Pneumonia    Post-cardiac injury syndrome (HCC)    History of cardiac injury from blunt trauma   Pulmonary hypertension (HCC)    a. moderately elevated PASP 07/2019.   PVCs (premature ventricular contractions)    Schizophrenia (HCC)    Goes to [facility]   Sleep apnea    Stroke [facility]) 2005   some left side weakness   Syncope    Recurrent, thought to be vasovagal. Also has h/o frequent PVCs.     Past Surgical History:  Procedure Laterality Date   AMPUTATION Right 01/25/2021   Procedure: RIGHT BELOW KNEE AMPUTATION;  Surgeon: Harden Jerona GAILS, MD;  Location: [facility] OR;  Service: Orthopedics;  Laterality: Right;   CARDIAC CATHETERIZATION  12/19/2010   DIFFUSE NONOBSTRUCTIVE CAD; NONISCHEMIC CARDIOMYOPATHY; LEFT VENTRICULAR ANGIOGRAM WAS PERFORMED SECONDARY TO  ELEVATED LEFT VENTRICULAR FILLING PRESSURES   COLONOSCOPY     ~ age [age]   COLONOSCOPY W/ POLYPECTOMY     I & D EXTREMITY Bilateral 10/24/2019   Procedure: IRRIGATION AND DEBRIDEMENT BILATERAL EXTREMITY WOUND ON FOOT;  Surgeon: Elisabeth Craig RAMAN, MD;  Location: [facility] OR;  Service: Plastics;  Laterality: Bilateral;   ICD IMPLANT N/A 01/13/2022   Procedure: ICD  IMPLANT;  Surgeon: Waddell Danelle ORN, MD;  Location: [facility] INVASIVE CV LAB;  Service: Cardiovascular;  Laterality: N/A;   METATARSAL HEAD EXCISION Right 08/29/2018   Procedure: METATARSAL HEAD RESECTION;  Surgeon: Janit Thresa HERO, DPM;  Location: [facility] OR;  Service: Podiatry;  Laterality: Right;   METATARSAL OSTEOTOMY Right 08/29/2018   Procedure: SUB FIFTHE METATARSIA RIGHT FOOT;  Surgeon: Janit Thresa HERO, DPM;  Location: [facility] OR;  Service: Podiatry;  Laterality: Right;   MULTIPLE EXTRACTIONS WITH ALVEOLOPLASTY  01/27/2014   all my teeth; 4 Quadrants of alveoloplasty   MULTIPLE EXTRACTIONS WITH ALVEOLOPLASTY  N/A 01/27/2014   Procedure: EXTRACTION OF TEETH #'1, 2, 3, 4, 5, 6, 7, 8, 9, 10, 11, 12, 13, 14, 15, 16, 17, 20, 21, 22, 23, 24, 25, 26, 27, 28, 29, 31 and 32 WITH ALVEOLOPLASTY;  Surgeon: Tanda JULIANNA Fanny, DDS;  Location: [facility] OR;  Service: Oral Surgery;  Laterality: N/A;   ORIF FINGER / THUMB FRACTURE Right    POLYPECTOMY     RIGHT HEART CATH N/A 01/27/2021   Procedure: RIGHT HEART CATH;  Surgeon: Cherrie Toribio SAUNDERS, MD;  Location: [facility] INVASIVE CV LAB;  Service: Cardiovascular;  Laterality: N/A;   RIGHT/LEFT HEART CATH AND CORONARY ANGIOGRAPHY N/A 09/26/2017   Procedure: RIGHT/LEFT HEART CATH AND CORONARY ANGIOGRAPHY;  Surgeon: Cherrie Toribio SAUNDERS, MD;  Location: [facility] INVASIVE CV LAB;  Service: Cardiovascular;  Laterality: N/A;   RIGHT/LEFT HEART CATH AND CORONARY ANGIOGRAPHY N/A 04/12/2021   Procedure: RIGHT/LEFT HEART CATH AND CORONARY ANGIOGRAPHY;  Surgeon: Cherrie Toribio SAUNDERS, MD;  Location: [facility] INVASIVE CV LAB;  Service: Cardiovascular;  Laterality: N/A;   SKIN SPLIT GRAFT Bilateral 10/24/2019   Procedure: SKIN GRAFT SPLIT THICKNESS LEFT THIGH;  Surgeon: Elisabeth Craig RAMAN, MD;  Location: [facility] OR;  Service: Plastics;  Laterality: Bilateral;   WOUND DEBRIDEMENT Right 08/29/2018   Procedure: Debridement of ulcer on right fifth metatarsal;  Surgeon: Janit Thresa HERO, DPM;  Location: [facility] OR;  Service: Podiatry;  Laterality: Right;    Social History:   reports that he has never smoked. He has never used smokeless tobacco. He reports that he does not drink alcohol and does not use drugs.  Allergies[1]  Family History  Problem Relation Age of Onset   Heart disease Mother        MI   Heart failure Mother    Diabetes Mother        ALSO IN MOST OF HIS SIBLINGS; 2 UNLCES HAVE ALSO PASSED AWAY FROM DM   Cardiomyopathy Mother    Cancer - Ovarian Mother    Ovarian cancer Mother    Heart disease Father    Hypertension Father    Diabetes Father    Diabetes Sister    Diabetes Brother    Colon cancer  Paternal Uncle    Colon cancer Paternal Uncle    Colon polyps Neg Hx    Esophageal cancer Neg Hx    Rectal cancer Neg Hx    Stomach cancer Neg Hx      Prior to Admission medications  Medication Sig Start Date End Date Taking? Authorizing Provider  Accu-Chek Softclix Lancets lancets Use to test blood sugars up to 4 times daily as needed. 12/08/21   Waddell Rake, MD  acetaminophen -codeine  (TYLENOL  #3) 300-30 MG tablet Take 1 tablet by mouth every 6 (six) hours as needed for moderate pain. 06/15/22   [name], Harlene BROCKS, MD  amitriptyline  (ELAVIL ) 10 MG tablet Take 1 tablet (10 mg total) by mouth at  bedtime. 04/30/24   [name], Harlene BROCKS, MD  apixaban  (ELIQUIS ) 5 MG TABS tablet Take 1 tablet (5 mg total) by mouth 2 (two) times daily. 03/26/24   Aniceto Daphne CROME, NP  Ascorbic Acid  (VITAMIN C) 1000 MG tablet Take 1,000 mg by mouth daily.    [provider]  bisacodyl  (DULCOLAX) 10 MG suppository Place 1 suppository (10 mg total) rectally daily as needed for up to 10 doses for moderate constipation. 07/27/24   Cottie Donnice PARAS, MD  bisacodyl  (DULCOLAX) 5 MG EC tablet Take 1 tablet (5 mg total) by mouth daily as needed for up to 10 doses for moderate constipation. 07/27/24   Cottie Donnice PARAS, MD  blood glucose meter kit and supplies Dispense based on patient and insurance preference. Use up to four times daily as directed. (FOR ICD-10 E10.9, E11.9). 06/15/22   [name], Harlene BROCKS, MD  Blood Glucose Monitoring Suppl (BLOOD GLUCOSE MONITOR SYSTEM) w/Device KIT Use to test blood sugar up to 4 times daily as needed. 12/08/21   Armenta Canning, MD  carvedilol  (COREG ) 6.25 MG tablet TAKE 1 TABLET BY MOUTH TWICE DAILY WITH A MEAL 04/09/24   Bensimhon, Toribio SAUNDERS, MD  Cholecalciferol (VITAMIN D-3 PO) Take 5,000 Units by mouth daily.    [provider]  gabapentin  (NEURONTIN ) 100 MG capsule Take 1 capsule (100 mg total) by mouth every evening. 04/30/24   [name], Harlene BROCKS, MD  glipiZIDE   (GLUCOTROL ) 5 MG tablet Take 1 tablet (5 mg total) by mouth 2 (two) times daily before a meal. 04/14/24   Shamleffer, Donell Cardinal, MD  glucose blood (ACCU-CHEK GUIDE) test strip Use to test blood sugars up to 4 times daily as needed. 12/08/21   Waddell Rake, MD  Insulin  Glargine (BASAGLAR  KWIKPEN) 100 UNIT/ML Inject 36 Units into the skin daily. 04/14/24   Shamleffer, Ibtehal Jaralla, MD  Insulin  Pen Needle (BD PEN NEEDLE MICRO ULTRAFINE) 32G X 6 MM MISC 1 Device by Other route daily in the afternoon. 04/14/24   Shamleffer, Ibtehal Jaralla, MD  isosorbide  mononitrate (IMDUR ) 30 MG 24 hr tablet Take 1 tablet (30 mg total) by mouth at bedtime. 09/24/23   Arlon Carliss ORN, DO  loperamide  (IMODIUM ) 2 MG capsule Take 1 capsule (2 mg total) by mouth as needed for diarrhea or loose stools. 09/24/23   Arlon Carliss ORN, DO  nitroGLYCERIN  (NITROSTAT ) 0.4 MG SL tablet Place 1 tablet (0.4 mg total) under the tongue every 5 (five) minutes as needed for chest pain. 09/24/23   Arlon Carliss ORN, DO  pantoprazole  (PROTONIX ) 40 MG tablet Take 1 tablet (40 mg total) by mouth daily as needed (acid reflux). 02/13/22   [name], Jessica C, MD  polyethylene glycol powder (GLYCOLAX /MIRALAX ) 17 GM/SCOOP powder Take 510 g by mouth in the morning, at noon, and at bedtime. One scoop in beverage of your choice with each meal. You may increase if you continue to be constipated and decrease if your stool becomes too loose. 07/13/24   Neldon Hamp RAMAN, PA  potassium chloride  SA (KLOR-CON  M20) 20 MEQ tablet Take 1 tablet (20 mEq total) by mouth every other day. PLEASE SCHEDULE APPOINTMENT FOR MORE REFILLS [phone] OPTION 2 06/09/24   Bensimhon, Toribio SAUNDERS, MD  rosuvastatin  (CRESTOR ) 40 MG tablet Take 1 tablet (40 mg total) by mouth daily. 05/01/24   [name], Harlene BROCKS, MD  sacubitril -valsartan  (ENTRESTO ) 49-51 MG TAKE 1 TABLET BY MOUTH TWICE DAILY . APPOINTMENT REQUIRED FOR FUTURE REFILLS 05/06/24   [name], Harlene BROCKS, MD  spironolactone   (  ALDACTONE ) 25 MG tablet Take 1 tablet (25 mg total) by mouth daily. PLEASE SCHEDULE APPOINTMENT FOR MORE REFILLS [phone] OPTION 2 06/09/24   Bensimhon, Toribio SAUNDERS, MD  tadalafil  (CIALIS ) 20 MG tablet Take 1 tablet (20 mg total) by mouth daily as needed for erectile dysfunction. 11/21/22   Stoneking, Adine PARAS., MD  tamsulosin  (FLOMAX ) 0.4 MG CAPS capsule Take 1 capsule (0.4 mg total) by mouth daily after supper. 05/05/24   Stoneking, Adine PARAS., MD  torsemide  (DEMADEX ) 20 MG tablet Take 2 tablets (40 mg total) by mouth daily. 11/19/23   Hayes Beckey CROME, NP    Physical Exam: Vitals:   09/04/24 2336 09/04/24 2339 09/05/24 0030 09/05/24 0200  BP: (!) 145/126  131/80 (!) 144/105  Pulse: (!) 104  100 99  Resp: 19  (!) 22 12  Temp: 98 F (36.7 C)     SpO2: 99%  95% 99%  Weight:  131.1 kg    Height:  6' (1.829 m)       Constitutional: NAD, no pallor or diaphoresis   Eyes: PERTLA, lids and conjunctivae normal ENMT: Mucous membranes are moist. Posterior pharynx clear of any exudate or lesions.   Neck: supple, no masses  Respiratory: Dyspneic with speech. No wheezing.  Cardiovascular: S1 & S2 heard, regular rate and rhythm. Mild left lower extremity edema.  Abdomen: No tenderness, soft. Bowel sounds active.  Musculoskeletal: no clubbing / cyanosis. S/p right BKA.   Skin: no significant rashes, lesions, ulcers. Warm, dry, well-perfused. Neurologic: CN 2-12 grossly intact. Moving all extremities. Alert and oriented.  Psychiatric: Calm. Cooperative.    Labs and Imaging on Admission: I have personally reviewed following labs and imaging studies  CBC: Recent Labs  Lab 09/04/24 0010  WBC 11.4*  HGB 11.8*  HCT 35.5*  MCV 90.6  PLT 130*   Basic Metabolic Panel: Recent Labs  Lab 09/04/24 0010  NA 139  K 4.0  CL 103  CO2 25  GLUCOSE 283*  BUN 18  CREATININE 1.29*  CALCIUM  8.5*   GFR: Estimated Creatinine Clearance: 89.5 mL/min (A) (by C-G formula based on SCr of 1.29 mg/dL  (H)). Liver Function Tests: No results for input(s): AST, ALT, ALKPHOS, BILITOT, PROT, ALBUMIN  in the last 168 hours. No results for input(s): LIPASE, AMYLASE in the last 168 hours. No results for input(s): AMMONIA in the last 168 hours. Coagulation Profile: No results for input(s): INR, PROTIME in the last 168 hours. Cardiac Enzymes: No results for input(s): CKTOTAL, CKMB, CKMBINDEX, TROPONINI in the last 168 hours. BNP (last 3 results) Recent Labs    09/04/24 0010  PROBNP 3,024.0*   HbA1C: No results for input(s): HGBA1C in the last 72 hours. CBG: No results for input(s): GLUCAP in the last 168 hours. Lipid Profile: No results for input(s): CHOL, HDL, LDLCALC, TRIG, CHOLHDL, LDLDIRECT in the last 72 hours. Thyroid  Function Tests: No results for input(s): TSH, T4TOTAL, FREET4, T3FREE, THYROIDAB in the last 72 hours. Anemia Panel: No results for input(s): VITAMINB12, FOLATE, FERRITIN, TIBC, IRON , RETICCTPCT in the last 72 hours. Urine analysis:    Component Value Date/Time   COLORURINE YELLOW 10/23/2022 1330   APPEARANCEUR Hazy (A) 01/02/2023 1300   LABSPEC 1.008 10/23/2022 1330   PHURINE 5.0 10/23/2022 1330   GLUCOSEU Negative 01/02/2023 1300   HGBUR SMALL (A) 10/23/2022 1330   BILIRUBINUR Negative 01/02/2023 1300   KETONESUR NEGATIVE 10/23/2022 1330   PROTEINUR Negative 01/02/2023 1300   PROTEINUR NEGATIVE 10/23/2022 1330   UROBILINOGEN 0.2 12/08/2014 2355  NITRITE Negative 01/02/2023 1300   NITRITE NEGATIVE 10/23/2022 1330   LEUKOCYTESUR Trace (A) 01/02/2023 1300   LEUKOCYTESUR LARGE (A) 10/23/2022 1330   Sepsis Labs: @LABRCNTIP (procalcitonin:4,lacticidven:4) ) Recent Results (from the past 240 hours)  Resp panel by RT-PCR (RSV, Flu A&B, Covid) Anterior Nasal Swab     Status: None   Collection Time: 09/05/24 12:10 AM   Specimen: Anterior Nasal Swab  Result Value Ref Range Status   SARS  Coronavirus 2 by RT PCR NEGATIVE NEGATIVE Final   Influenza A by PCR NEGATIVE NEGATIVE Final   Influenza B by PCR NEGATIVE NEGATIVE Final    Comment: (NOTE) The Xpert Xpress SARS-CoV-2/FLU/RSV plus assay is intended as an aid in the diagnosis of influenza from Nasopharyngeal swab specimens and should not be used as a sole basis for treatment. Nasal washings and aspirates are unacceptable for Xpert Xpress SARS-CoV-2/FLU/RSV testing.  Fact Sheet for Patients: bloggercourse.com  Fact Sheet for Healthcare Providers: seriousbroker.it  This test is not yet approved or cleared by the [place]  FDA and has been authorized for detection and/or diagnosis of SARS-CoV-2 by FDA under an Emergency Use Authorization (EUA). This EUA will remain in effect (meaning this test can be used) for the duration of the COVID-19 declaration under Section 564(b)(1) of the Act, 21 U.S.C. section 360bbb-3(b)(1), unless the authorization is terminated or revoked.     Resp Syncytial Virus by PCR NEGATIVE NEGATIVE Final    Comment: (NOTE) Fact Sheet for Patients: bloggercourse.com  Fact Sheet for Healthcare Providers: seriousbroker.it  This test is not yet approved or cleared by the [place]  FDA and has been authorized for detection and/or diagnosis of SARS-CoV-2 by FDA under an Emergency Use Authorization (EUA). This EUA will remain in effect (meaning this test can be used) for the duration of the COVID-19 declaration under Section 564(b)(1) of the Act, 21 U.S.C. section 360bbb-3(b)(1), unless the authorization is terminated or revoked.  Performed at [facility], [address], [place], [place] [address]      Radiological Exams on Admission: DG Chest Port 1 View Result Date: 09/05/2024 EXAM: 1 VIEW(S) XRAY OF THE CHEST 09/05/2024 12:06:00 AM COMPARISON: None available. CLINICAL HISTORY:  SOB (shortness of breath). FINDINGS: LINES, TUBES AND DEVICES: Left subclavian ICD. LUNGS AND PLEURA: Mild bilateral lower lobe opacities, likely atelectasis. No frank interstitial edema. No pleural effusion. No pneumothorax. HEART AND MEDIASTINUM: Cardiomegaly. BONES AND SOFT TISSUES: No acute osseous abnormality. IMPRESSION: 1. Mild bibasilar atelectasis. 2. Stable cardiomegaly. Electronically signed by: Pinkie Pebbles MD 09/05/2024 12:11 AM EST RP Workstation: [id number]    EKG: Independently reviewed. Sinus tachycardia, rate 105, chronic LBBB.   Assessment/Plan   1. Acute on chronic HFrEF  - EF was 30-35% on echo from October 2024; he has ICD  - Continue diuresis with IV Lasix , continue Coreg , Entresto , and Aldactone , update echo, monitor weight and I/Os    2. Chest pain  - Hx of 3-vessel CAD without clear culprit lesion or target for intervention, managed medically - He complains of chest pain that he attributes to increased work of breathing  - Continue cardiac monitoring, trend troponin, treat acute CHF, follow-up on echo findings, consider CTA chest if sxs persist despite diuresis as he has missed Eliquis  doses    3. Atrial fibrillation  - Continue Eliquis  and Coreg    4. Hx of CVA  - Continue Crestor  and Eliquis     5. Type II DM  - A1c was 9.0% in January 2026  - Check CBGs  and use low-intensity SSI for now     DVT prophylaxis: Eliquis   Code Status: Full  Level of Care: Level of care: Telemetry Family Communication: None present   Disposition Plan:  Patient is from: Home  Anticipated d/c is to: Home  Anticipated d/c date is: 09/07/24 Patient currently: pending improved volume status  Consults called: None   Admission status: Inpatient     Evalene GORMAN Sprinkles, MD [facility]  09/05/2024, 2:36 AM       [1]  Allergies Allergen Reactions   Farxiga  [Dapagliflozin ] Other (See Comments)    Patient reports loss of consciousness   Nsaids Anaphylaxis and Rash     Able to tolerate aspirin     Trulicity  [Dulaglutide ] Nausea And Vomiting   "

## 2024-09-05 NOTE — ED Notes (Signed)
 Pt given cracker, peanut butter, and apple juice

## 2024-09-05 NOTE — Inpatient Diabetes Management (Addendum)
 Inpatient Diabetes Program Recommendations  AACE/ADA: New Consensus Statement on Inpatient Glycemic Control (2015)  Target Ranges:  Prepandial:   less than 140 mg/dL      Peak postprandial:   less than 180 mg/dL (1-2 hours)      Critically ill patients:  140 - 180 mg/dL   Lab Results  Component Value Date   GLUCAP 251 (H) 09/05/2024   HGBA1C 9.0 (H) 08/11/2024    Latest Reference Range & Units 09/05/24 08:32 09/05/24 11:47  Glucose-Capillary 70 - 99 mg/dL 713 (H) 748 (H)  (H): Data is abnormally high  Diabetes history: DM2 Outpatient Diabetes medications:  Tresiba 36 units daily, metformin  500 mg bid Current orders for Inpatient glycemic control: Novolog  0-6 units tid, 0-5 units hs correction  Inpatient Diabetes Program Recommendations:   Patient is currently in ED. Please consider: -Add Lantus  18 units daily (50% home basal dose)  Thank you, Sedonia Kitner E. Hoyt Leanos, RN, MSN, CNS, CDCES  Diabetes Coordinator Inpatient Glycemic Control Team Team Pager [phone] (8am-5pm) 09/05/2024 1:35 PM

## 2024-09-05 NOTE — Progress Notes (Addendum)
 " PROGRESS NOTE    Mitchell [name]  FMW:[id number] DOB: [birth year] DOA: 09/04/2024 PCP: Watt Harlene BROCKS, MD   Brief Narrative:  Day 0 note, see HP earlier this morning by my colleague.    Briefly patient presents with worsening shortness of breath chest pain and orthopnea found to have acute heart failure exacerbation likely secondary to noncompliance.  Troponin flat and not consistent with ACS, BNP elevated as expected with heart failure exacerbation, labs otherwise remained quite stable other than hyperglycemia.  Patient admitted for ongoing diuresis, repeat echo given most recent is over-year-old to reevaluate for possible goal-directed medical therapy.  Otherwise continues to improve with diuresis.  Patient has a known medical history significant for hypertension, hyperlipidemia, diabetes mellitus, history of CVA, CAD, HFrEF with ICD, and atrial fibrillation on Eliquis .  Assessment & Plan:   Principal Problem:   Acute on chronic HFrEF (heart failure with reduced ejection fraction) (HCC) Active Problems:   Chest pain   CAD (coronary artery disease) with flat troponin    Uncontrolled type 2 diabetes mellitus with hyperglycemia, with long-term current use of insulin  (HCC)   Essential hypertension   Stroke (HCC)  Acute on chronic HFrEF  - EF was 30-35% on echo from October 2024; ICD previously placed - Continue core measures including carvedilol , spironolactone  and Entresto  - Repeat echo notable for EF of 25 to 30% with severely decreased LV function and global hypokinesis and severely dilated cavity size.  Diastolic function indeterminate. -    Atypical chest pain History of CAD, three-vessel - Previously medically managed with no target for intervention - Chest pain and increased dyspnea resolved with diuretics as above - Unlikely ACS given flat troponin and reassuring EKG without overt ST elevations or depressions - No indication for PE study given patient is without hypoxia  tachycardia and symptoms resolved with diuretics   Atrial fibrillation  - Continue Eliquis  and Coreg   -Patient reports noncompliance with Eliquis , educated on the importance of maintaining this medication given his risk factors with prior CVA   Hx of CVA  - Continue Crestor  and Eliquis      Type II DM, insulin -dependent, uncontrolled with hyperglycemia - A1c was 9.0% in January 2026  - Continue sliding scale insulin , hypoglycemic protocol - Add 15u long acting (home dose ?30u - med rec pending still)   DVT prophylaxis: apixaban  (ELIQUIS ) tablet 5 mg  Code Status:   Code Status: Full Code Family Communication: None present  Status is: Inpatient  Dispo: The patient is from: Home              Anticipated d/c is to: Home              Anticipated d/c date is: 24 to 48 hours              Patient currently not medically stable for discharge  Consultants:  None  Procedures:  None  Antimicrobials:  None indicated  Subjective: No acute issues or events overnight denies nausea vomiting diarrhea constipation any fevers chills or chest pain.  Orthopnea shortness of breath markedly improving as is his lower extremity edema  Objective: Vitals:   09/04/24 2339 09/05/24 0030 09/05/24 0200 09/05/24 0557  BP:  131/80 (!) 144/105 124/83  Pulse:  100 99 98  Resp:  (!) 22 12 20   Temp:    99.1 F (37.3 C)  TempSrc:    Oral  SpO2:  95% 99% 97%  Weight: 131.1 kg     Height: 6' (1.829  m)       Intake/Output Summary (Last 24 hours) at 09/05/2024 0713 Last data filed at 09/05/2024 0244 Gross per 24 hour  Intake --  Output 350 ml  Net -350 ml   Filed Weights   09/04/24 2339  Weight: 131.1 kg    Examination:  General:  Pleasantly resting in bed, No acute distress. HEENT:  Normocephalic atraumatic.  Sclerae nonicteric, noninjected.  Extraocular movements intact bilaterally. Neck:  Without mass or deformity.  Trachea is midline. Lungs:  Clear to auscultate bilaterally without rhonchi,  wheeze, or rales. Heart:  Regular rate and rhythm.  Without murmurs, rubs, or gallops. Abdomen:  Soft, nontender, nondistended.  Without guarding or rebound. Extremities: Without cyanosis, clubbing, 2+ edema at the ankles bilaterally Skin:  Warm and dry, no erythema.  Data Reviewed: I have personally reviewed following labs and imaging studies  CBC: Recent Labs  Lab 09/04/24 0010 09/05/24 0258  WBC 11.4* 11.1*  HGB 11.8* 12.4*  HCT 35.5* 38.4*  MCV 90.6 91.6  PLT 130* 152   Basic Metabolic Panel: Recent Labs  Lab 09/04/24 0010 09/05/24 0258  NA 139 139  K 4.0 4.4  CL 103 102  CO2 25 27  GLUCOSE 283* 280*  BUN 18 17  CREATININE 1.29* 1.37*  CALCIUM  8.5* 8.8*  MG  --  2.0   GFR: Estimated Creatinine Clearance: 84.3 mL/min (A) (by C-G formula based on SCr of 1.37 mg/dL (H)). Liver Function Tests: BNP (last 3 results) Recent Labs    09/04/24 0010  PROBNP 3,024.0*   Recent Results (from the past 240 hours)  Resp panel by RT-PCR (RSV, Flu A&B, Covid) Anterior Nasal Swab     Status: None   Collection Time: 09/05/24 12:10 AM   Specimen: Anterior Nasal Swab  Result Value Ref Range Status   SARS Coronavirus 2 by RT PCR NEGATIVE NEGATIVE Final   Influenza A by PCR NEGATIVE NEGATIVE Final   Influenza B by PCR NEGATIVE NEGATIVE Final    Comment: (NOTE) The Xpert Xpress SARS-CoV-2/FLU/RSV plus assay is intended as an aid in the diagnosis of influenza from Nasopharyngeal swab specimens and should not be used as a sole basis for treatment. Nasal washings and aspirates are unacceptable for Xpert Xpress SARS-CoV-2/FLU/RSV testing.  Fact Sheet for Patients: bloggercourse.com  Fact Sheet for Healthcare Providers: seriousbroker.it  This test is not yet approved or cleared by the [place]  FDA and has been authorized for detection and/or diagnosis of SARS-CoV-2 by FDA under an Emergency Use Authorization (EUA). This EUA  will remain in effect (meaning this test can be used) for the duration of the COVID-19 declaration under Section 564(b)(1) of the Act, 21 U.S.C. section 360bbb-3(b)(1), unless the authorization is terminated or revoked.     Resp Syncytial Virus by PCR NEGATIVE NEGATIVE Final    Comment: (NOTE) Fact Sheet for Patients: bloggercourse.com  Fact Sheet for Healthcare Providers: seriousbroker.it  This test is not yet approved or cleared by the [place]  FDA and has been authorized for detection and/or diagnosis of SARS-CoV-2 by FDA under an Emergency Use Authorization (EUA). This EUA will remain in effect (meaning this test can be used) for the duration of the COVID-19 declaration under Section 564(b)(1) of the Act, 21 U.S.C. section 360bbb-3(b)(1), unless the authorization is terminated or revoked.  Performed at [facility], [address] [place], [place] [address]          Radiology Studies: DG Chest Port 1 View Result Date: 09/05/2024 EXAM:  1 VIEW(S) XRAY OF THE CHEST 09/05/2024 12:06:00 AM COMPARISON: None available. CLINICAL HISTORY: SOB (shortness of breath). FINDINGS: LINES, TUBES AND DEVICES: Left subclavian ICD. LUNGS AND PLEURA: Mild bilateral lower lobe opacities, likely atelectasis. No frank interstitial edema. No pleural effusion. No pneumothorax. HEART AND MEDIASTINUM: Cardiomegaly. BONES AND SOFT TISSUES: No acute osseous abnormality. IMPRESSION: 1. Mild bibasilar atelectasis. 2. Stable cardiomegaly. Electronically signed by: Pinkie Pebbles MD 09/05/2024 12:11 AM EST RP Workstation: [id number]   Scheduled Meds:  amitriptyline   10 mg Oral QHS   apixaban   5 mg Oral BID   carvedilol   6.25 mg Oral BID WC   furosemide   40 mg Intravenous BID   gabapentin   100 mg Oral QPM   insulin  aspart  0-5 Units Subcutaneous QHS   insulin  aspart  0-6 Units Subcutaneous TID WC   isosorbide  mononitrate  30 mg Oral QHS    rosuvastatin   40 mg Oral Daily   sacubitril -valsartan   1 tablet Oral BID   sodium chloride  flush  3 mL Intravenous Q12H   spironolactone   25 mg Oral Daily   tamsulosin   0.4 mg Oral QPC supper   Continuous Infusions:   LOS: 0 days   Time spent:  Elsie JAYSON Montclair, DO [facility]  If 7PM-7AM, please contact night-coverage [email or web address]  09/05/2024, 7:13 AM      "

## 2024-09-05 NOTE — ED Notes (Signed)
 Emptied pt urinal pt stated the urine needed to be measured it was output

## 2024-09-05 NOTE — Progress Notes (Signed)
 Heart Failure Navigator Progress Note  Assessed for Heart & Vascular TOC clinic readiness.  Patient does not meet criteria due to she is an Advanced Heart Failure team patient of Dr. Bensimhon. .   Navigator will sign off at this time.   Stephane Haddock, BSN, Scientist, Clinical (histocompatibility And Immunogenetics) Only

## 2024-09-05 NOTE — ED Notes (Signed)
 Set up pt for dinner

## 2024-09-05 NOTE — ED Provider Notes (Signed)
 " [facility] Emergency Department Provider Note MRN:  [id number]  Arrival date & time: 09/05/24     Chief Complaint   Shortness of Breath   History of Present Illness   Mitchell [name] is a 54 y.o. year-old male presents to the ED with chief complaint of SOB.  Reports acutely worsening SOB that started this morning around 3am.  States that he was awakened from sleep and has been SOB and coughing since.  States that it is much worse when he lays down.  Denies fever.  States that he has been out of a lot of his medications.  States that his chest hurts when he coughs.    History provided by patient.   Review of Systems  Pertinent positive and negative review of systems noted in HPI.    Physical Exam   Vitals:   09/05/24 0030 09/05/24 0200  BP: 131/80 (!) 144/105  Pulse: 100 99  Resp: (!) 22 12  Temp:    SpO2: 95% 99%    CONSTITUTIONAL:  non toxic-appearing, NAD NEURO:  Alert and oriented x 3, CN 3-12 grossly intact EYES:  eyes equal and reactive ENT/NECK:  Supple, no stridor  CARDIO:  tachycardic, regular rhythm, appears well-perfused  PULM:  No respiratory distress, wheezy and diminished GI/GU:  non-distended,  MSK/SPINE:  No gross deformities, no edema, moves all extremities, R BKA SKIN:  no rash, atraumatic   *Additional and/or pertinent findings included in MDM below  Diagnostic and Interventional Summary    EKG Interpretation Date/Time:    Ventricular Rate:    PR Interval:    QRS Duration:    QT Interval:    QTC Calculation:   R Axis:      Text Interpretation:         Labs Reviewed  BASIC METABOLIC PANEL WITH GFR - Abnormal; Notable for the following components:      Result Value   Glucose, Bld 283 (*)    Creatinine, Ser 1.29 (*)    Calcium  8.5 (*)    All other components within normal limits  CBC - Abnormal; Notable for the following components:   WBC 11.4 (*)    RBC 3.92 (*)    Hemoglobin 11.8 (*)    HCT 35.5 (*)     Platelets 130 (*)    All other components within normal limits  PRO BRAIN NATRIURETIC PEPTIDE - Abnormal; Notable for the following components:   Pro Brain Natriuretic Peptide 3,024.0 (*)    All other components within normal limits  TROPONIN T, HIGH SENSITIVITY - Abnormal; Notable for the following components:   Troponin T High Sensitivity 76 (*)    All other components within normal limits  RESP PANEL BY RT-PCR (RSV, FLU A&B, COVID)  [id number]  TROPONIN T, HIGH SENSITIVITY    DG Chest Port 1 View  Final Result      Medications  furosemide  (LASIX ) injection 40 mg (40 mg Intravenous Given 09/05/24 0154)     Procedures  /  Critical Care Procedures  ED Course and Medical Decision Making  I have reviewed the triage vital signs, the nursing notes, and pertinent available records from the EMR.  Social Determinants Affecting Complexity of Care: Patient has no clinically significant social determinants affecting this chief complaint..   ED Course:    Medical Decision Making Patient here with shortness of breath.  Has history of CHF.  States that he has been out of some of his medications and noncompliant with  others.  He reports increased fluid around his abdomen.  Reports increased dyspnea on exertion and trouble lying down due to shortness of breath and cough.  Chest x-ray shows cardiomegaly and bibasilar atelectasis.  proBNP is significantly elevated at a little over 3000.  There is mildly elevated troponin at 76, which I feel is likely secondary to demand ischemia.  No acute ischemic changes seen on EKG.  Will start diuresis.  Patient will need admission to the hospital for CHF exacerbation.  Currently requiring supplemental O2.  Amount and/or Complexity of Data Reviewed Labs: ordered. Radiology: ordered.  Risk Prescription drug management. Decision regarding hospitalization.         Consultants: I consulted with Hospitalist, Dr. Charlton, who is appreciated for  admitting.   Treatment and Plan: Patient's exam and diagnostic results are concerning for CHF exacerbation.  Feel that patient will need admission to the hospital for further treatment and evaluation.    Final Clinical Impressions(s) / ED Diagnoses     ICD-10-CM   1. Acute on chronic congestive heart failure, unspecified heart failure type [facility])  I50.9       ED Discharge Orders     None         Discharge Instructions Discussed with and Provided to Patient:   Discharge Instructions   None      Vicky Charleston, PA-C 09/05/24 0217    Theadore Ozell HERO, MD 09/05/24 [phone]  "

## 2024-09-05 NOTE — ED Notes (Addendum)
 Nurse manager spoke with patient and pt to be moved into a hospital bed while he waits for inpatient bed.

## 2024-11-03 ENCOUNTER — Ambulatory Visit

## 2025-02-02 ENCOUNTER — Ambulatory Visit

## 2025-05-04 ENCOUNTER — Ambulatory Visit
